# Patient Record
Sex: Female | Born: 1950 | Hispanic: Yes | Marital: Married | Smoking: Never smoker
Health system: Southern US, Community
[De-identification: ages and names within clinical notes are randomized; demographics above are authoritative.]

## PROBLEM LIST (undated history)

## (undated) DIAGNOSIS — G20 Parkinson's disease: Secondary | ICD-10-CM

## (undated) DIAGNOSIS — I10 Essential (primary) hypertension: Secondary | ICD-10-CM

## (undated) DIAGNOSIS — G4730 Sleep apnea, unspecified: Secondary | ICD-10-CM

## (undated) DIAGNOSIS — E785 Hyperlipidemia, unspecified: Secondary | ICD-10-CM

## (undated) DIAGNOSIS — Z9289 Personal history of other medical treatment: Secondary | ICD-10-CM

## (undated) DIAGNOSIS — R55 Syncope and collapse: Secondary | ICD-10-CM

## (undated) DIAGNOSIS — J111 Influenza due to unidentified influenza virus with other respiratory manifestations: Secondary | ICD-10-CM

## (undated) DIAGNOSIS — G20A1 Parkinson's disease without dyskinesia, without mention of fluctuations: Secondary | ICD-10-CM

## (undated) DIAGNOSIS — I5022 Chronic systolic (congestive) heart failure: Secondary | ICD-10-CM

## (undated) DIAGNOSIS — I428 Other cardiomyopathies: Secondary | ICD-10-CM

## (undated) DIAGNOSIS — K2970 Gastritis, unspecified, without bleeding: Secondary | ICD-10-CM

## (undated) DIAGNOSIS — M722 Plantar fascial fibromatosis: Secondary | ICD-10-CM

## (undated) HISTORY — DX: Sleep apnea, unspecified: G47.30

## (undated) HISTORY — DX: Essential (primary) hypertension: I10

## (undated) HISTORY — PX: TONSILLECTOMY: SUR1361

## (undated) HISTORY — PX: BREAST SURGERY: SHX581

## (undated) HISTORY — DX: Chronic systolic (congestive) heart failure: I50.22

## (undated) HISTORY — DX: Other cardiomyopathies: I42.8

## (undated) HISTORY — DX: Personal history of other medical treatment: Z92.89

## (undated) HISTORY — DX: Influenza due to unidentified influenza virus with other respiratory manifestations: J11.1

## (undated) HISTORY — PX: CHOLECYSTECTOMY: SHX55

## (undated) HISTORY — PX: TUBAL LIGATION: SHX77

## (undated) HISTORY — DX: Gastritis, unspecified, without bleeding: K29.70

## (undated) HISTORY — DX: Syncope and collapse: R55

## (undated) HISTORY — DX: Plantar fascial fibromatosis: M72.2

## (undated) HISTORY — DX: Hyperlipidemia, unspecified: E78.5

## (undated) HISTORY — PX: BUNIONECTOMY: SHX129

---

## 1968-08-01 HISTORY — PX: BREAST EXCISIONAL BIOPSY: SUR124

## 1998-02-16 ENCOUNTER — Ambulatory Visit (HOSPITAL_COMMUNITY): Admission: RE | Admit: 1998-02-16 | Discharge: 1998-02-16 | Payer: Self-pay | Admitting: Family Medicine

## 1999-08-24 ENCOUNTER — Other Ambulatory Visit: Admission: RE | Admit: 1999-08-24 | Discharge: 1999-08-24 | Payer: Self-pay | Admitting: Obstetrics and Gynecology

## 2000-06-29 ENCOUNTER — Inpatient Hospital Stay (HOSPITAL_COMMUNITY): Admission: EM | Admit: 2000-06-29 | Discharge: 2000-07-01 | Payer: Self-pay | Admitting: Emergency Medicine

## 2000-06-29 ENCOUNTER — Encounter: Payer: Self-pay | Admitting: Emergency Medicine

## 2000-06-30 ENCOUNTER — Encounter: Payer: Self-pay | Admitting: Internal Medicine

## 2000-07-27 ENCOUNTER — Encounter: Payer: Self-pay | Admitting: General Surgery

## 2000-08-03 ENCOUNTER — Ambulatory Visit (HOSPITAL_COMMUNITY): Admission: RE | Admit: 2000-08-03 | Discharge: 2000-08-04 | Payer: Self-pay | Admitting: General Surgery

## 2000-08-03 ENCOUNTER — Encounter: Payer: Self-pay | Admitting: General Surgery

## 2000-08-03 ENCOUNTER — Encounter (INDEPENDENT_AMBULATORY_CARE_PROVIDER_SITE_OTHER): Payer: Self-pay | Admitting: *Deleted

## 2000-12-18 ENCOUNTER — Encounter: Admission: RE | Admit: 2000-12-18 | Discharge: 2001-03-18 | Payer: Self-pay | Admitting: Internal Medicine

## 2000-12-29 ENCOUNTER — Other Ambulatory Visit: Admission: RE | Admit: 2000-12-29 | Discharge: 2000-12-29 | Payer: Self-pay | Admitting: Internal Medicine

## 2001-01-23 ENCOUNTER — Other Ambulatory Visit: Admission: RE | Admit: 2001-01-23 | Discharge: 2001-01-23 | Payer: Self-pay | Admitting: Obstetrics and Gynecology

## 2001-02-21 ENCOUNTER — Encounter (INDEPENDENT_AMBULATORY_CARE_PROVIDER_SITE_OTHER): Payer: Self-pay | Admitting: *Deleted

## 2001-02-21 ENCOUNTER — Ambulatory Visit (HOSPITAL_COMMUNITY): Admission: RE | Admit: 2001-02-21 | Discharge: 2001-02-21 | Payer: Self-pay | Admitting: Gastroenterology

## 2001-04-23 ENCOUNTER — Other Ambulatory Visit: Admission: RE | Admit: 2001-04-23 | Discharge: 2001-04-23 | Payer: Self-pay | Admitting: Obstetrics and Gynecology

## 2001-05-04 ENCOUNTER — Emergency Department (HOSPITAL_COMMUNITY): Admission: EM | Admit: 2001-05-04 | Discharge: 2001-05-04 | Payer: Self-pay | Admitting: Emergency Medicine

## 2001-05-04 ENCOUNTER — Encounter: Payer: Self-pay | Admitting: Emergency Medicine

## 2001-06-04 ENCOUNTER — Ambulatory Visit (HOSPITAL_BASED_OUTPATIENT_CLINIC_OR_DEPARTMENT_OTHER): Admission: RE | Admit: 2001-06-04 | Discharge: 2001-06-04 | Payer: Self-pay | Admitting: Pulmonary Disease

## 2001-06-25 ENCOUNTER — Emergency Department (HOSPITAL_COMMUNITY): Admission: EM | Admit: 2001-06-25 | Discharge: 2001-06-25 | Payer: Self-pay | Admitting: *Deleted

## 2001-06-25 ENCOUNTER — Encounter: Payer: Self-pay | Admitting: *Deleted

## 2001-09-28 ENCOUNTER — Ambulatory Visit (HOSPITAL_COMMUNITY): Admission: RE | Admit: 2001-09-28 | Discharge: 2001-09-28 | Payer: Self-pay

## 2001-10-15 ENCOUNTER — Ambulatory Visit (HOSPITAL_COMMUNITY): Admission: RE | Admit: 2001-10-15 | Discharge: 2001-10-15 | Payer: Self-pay | Admitting: Gastroenterology

## 2002-08-15 ENCOUNTER — Encounter: Admission: RE | Admit: 2002-08-15 | Discharge: 2002-08-15 | Payer: Self-pay | Admitting: Family Medicine

## 2002-08-22 ENCOUNTER — Encounter: Admission: RE | Admit: 2002-08-22 | Discharge: 2002-08-22 | Payer: Self-pay | Admitting: Family Medicine

## 2002-11-28 ENCOUNTER — Encounter: Admission: RE | Admit: 2002-11-28 | Discharge: 2002-11-28 | Payer: Self-pay | Admitting: Family Medicine

## 2003-07-11 ENCOUNTER — Encounter: Admission: RE | Admit: 2003-07-11 | Discharge: 2003-07-11 | Payer: Self-pay | Admitting: Family Medicine

## 2003-07-15 ENCOUNTER — Encounter: Admission: RE | Admit: 2003-07-15 | Discharge: 2003-07-15 | Payer: Self-pay | Admitting: Family Medicine

## 2003-08-29 ENCOUNTER — Encounter: Admission: RE | Admit: 2003-08-29 | Discharge: 2003-08-29 | Payer: Self-pay | Admitting: Sports Medicine

## 2003-09-03 ENCOUNTER — Encounter: Admission: RE | Admit: 2003-09-03 | Discharge: 2003-09-03 | Payer: Self-pay | Admitting: Family Medicine

## 2004-04-26 ENCOUNTER — Ambulatory Visit: Payer: Self-pay | Admitting: Sports Medicine

## 2004-06-17 ENCOUNTER — Ambulatory Visit: Payer: Self-pay | Admitting: Family Medicine

## 2004-09-20 ENCOUNTER — Ambulatory Visit: Payer: Self-pay | Admitting: Family Medicine

## 2004-10-04 ENCOUNTER — Ambulatory Visit: Payer: Self-pay | Admitting: Family Medicine

## 2004-10-05 ENCOUNTER — Ambulatory Visit: Payer: Self-pay | Admitting: Family Medicine

## 2004-11-23 ENCOUNTER — Encounter (INDEPENDENT_AMBULATORY_CARE_PROVIDER_SITE_OTHER): Payer: Self-pay | Admitting: *Deleted

## 2004-11-23 ENCOUNTER — Ambulatory Visit: Payer: Self-pay | Admitting: Obstetrics and Gynecology

## 2005-05-31 ENCOUNTER — Encounter (INDEPENDENT_AMBULATORY_CARE_PROVIDER_SITE_OTHER): Payer: Self-pay | Admitting: *Deleted

## 2005-05-31 ENCOUNTER — Ambulatory Visit: Payer: Self-pay | Admitting: Obstetrics and Gynecology

## 2005-05-31 ENCOUNTER — Emergency Department (HOSPITAL_COMMUNITY): Admission: EM | Admit: 2005-05-31 | Discharge: 2005-05-31 | Payer: Self-pay | Admitting: Family Medicine

## 2005-06-06 ENCOUNTER — Ambulatory Visit: Payer: Self-pay | Admitting: Family Medicine

## 2005-06-06 ENCOUNTER — Encounter (INDEPENDENT_AMBULATORY_CARE_PROVIDER_SITE_OTHER): Payer: Self-pay | Admitting: *Deleted

## 2005-06-06 LAB — CONVERTED CEMR LAB

## 2005-07-04 ENCOUNTER — Ambulatory Visit (HOSPITAL_COMMUNITY): Admission: RE | Admit: 2005-07-04 | Discharge: 2005-07-04 | Payer: Self-pay | Admitting: Family Medicine

## 2005-07-05 ENCOUNTER — Ambulatory Visit: Payer: Self-pay | Admitting: Sports Medicine

## 2005-10-28 ENCOUNTER — Ambulatory Visit: Payer: Self-pay | Admitting: Sports Medicine

## 2006-03-10 ENCOUNTER — Ambulatory Visit: Payer: Self-pay | Admitting: Family Medicine

## 2006-07-11 ENCOUNTER — Emergency Department (HOSPITAL_COMMUNITY): Admission: EM | Admit: 2006-07-11 | Discharge: 2006-07-11 | Payer: Self-pay | Admitting: Emergency Medicine

## 2006-07-14 ENCOUNTER — Ambulatory Visit (HOSPITAL_COMMUNITY): Admission: RE | Admit: 2006-07-14 | Discharge: 2006-07-14 | Payer: Self-pay | Admitting: Family Medicine

## 2006-07-14 ENCOUNTER — Ambulatory Visit: Payer: Self-pay | Admitting: Sports Medicine

## 2006-07-17 ENCOUNTER — Ambulatory Visit: Payer: Self-pay | Admitting: Family Medicine

## 2006-07-18 ENCOUNTER — Ambulatory Visit: Payer: Self-pay | Admitting: Sports Medicine

## 2006-08-04 ENCOUNTER — Ambulatory Visit (HOSPITAL_COMMUNITY): Admission: RE | Admit: 2006-08-04 | Discharge: 2006-08-04 | Payer: Self-pay | Admitting: Family Medicine

## 2006-08-28 ENCOUNTER — Ambulatory Visit: Payer: Self-pay | Admitting: Family Medicine

## 2006-09-28 DIAGNOSIS — E118 Type 2 diabetes mellitus with unspecified complications: Secondary | ICD-10-CM

## 2006-09-28 DIAGNOSIS — E1159 Type 2 diabetes mellitus with other circulatory complications: Secondary | ICD-10-CM

## 2006-09-28 DIAGNOSIS — I152 Hypertension secondary to endocrine disorders: Secondary | ICD-10-CM

## 2006-09-28 DIAGNOSIS — I10 Essential (primary) hypertension: Secondary | ICD-10-CM | POA: Insufficient documentation

## 2006-09-28 HISTORY — DX: Type 2 diabetes mellitus with unspecified complications: E11.8

## 2006-09-28 HISTORY — DX: Hypertension secondary to endocrine disorders: I15.2

## 2006-09-28 HISTORY — DX: Type 2 diabetes mellitus with other circulatory complications: E11.59

## 2006-09-29 ENCOUNTER — Encounter (INDEPENDENT_AMBULATORY_CARE_PROVIDER_SITE_OTHER): Payer: Self-pay | Admitting: *Deleted

## 2007-01-10 ENCOUNTER — Ambulatory Visit: Payer: Self-pay | Admitting: Obstetrics & Gynecology

## 2007-01-24 ENCOUNTER — Encounter: Payer: Self-pay | Admitting: Family Medicine

## 2007-01-24 ENCOUNTER — Ambulatory Visit: Payer: Self-pay | Admitting: Family Medicine

## 2007-01-24 LAB — CONVERTED CEMR LAB
ALT: 39 units/L — ABNORMAL HIGH (ref 0–35)
AST: 35 units/L (ref 0–37)
Albumin: 4.4 g/dL (ref 3.5–5.2)
Alkaline Phosphatase: 116 units/L (ref 39–117)
BUN: 17 mg/dL (ref 6–23)
CO2: 29 meq/L (ref 19–32)
Calcium: 10.7 mg/dL — ABNORMAL HIGH (ref 8.4–10.5)
Chloride: 101 meq/L (ref 96–112)
Creatinine, Ser: 0.95 mg/dL (ref 0.40–1.20)
Direct LDL: 111 mg/dL — ABNORMAL HIGH
Glucose, Bld: 80 mg/dL (ref 70–99)
Hgb A1c MFr Bld: 6.7 %
Potassium: 4.1 meq/L (ref 3.5–5.3)
Sodium: 143 meq/L (ref 135–145)
Total Bilirubin: 0.5 mg/dL (ref 0.3–1.2)
Total Protein: 7.6 g/dL (ref 6.0–8.3)

## 2007-02-08 ENCOUNTER — Telehealth: Payer: Self-pay | Admitting: *Deleted

## 2007-03-15 ENCOUNTER — Telehealth: Payer: Self-pay | Admitting: *Deleted

## 2007-05-17 ENCOUNTER — Ambulatory Visit: Payer: Self-pay | Admitting: Internal Medicine

## 2007-05-17 LAB — CONVERTED CEMR LAB
Blood Glucose, Fingerstick: 122
Hgb A1c MFr Bld: 7.1 %

## 2007-05-21 ENCOUNTER — Telehealth (INDEPENDENT_AMBULATORY_CARE_PROVIDER_SITE_OTHER): Payer: Self-pay | Admitting: *Deleted

## 2007-06-06 ENCOUNTER — Telehealth (INDEPENDENT_AMBULATORY_CARE_PROVIDER_SITE_OTHER): Payer: Self-pay | Admitting: Family Medicine

## 2007-06-18 ENCOUNTER — Encounter (INDEPENDENT_AMBULATORY_CARE_PROVIDER_SITE_OTHER): Payer: Self-pay | Admitting: Family Medicine

## 2007-09-07 ENCOUNTER — Ambulatory Visit: Payer: Self-pay | Admitting: Family Medicine

## 2007-10-03 ENCOUNTER — Ambulatory Visit (HOSPITAL_COMMUNITY): Admission: RE | Admit: 2007-10-03 | Discharge: 2007-10-03 | Payer: Self-pay | Admitting: Family Medicine

## 2007-10-03 ENCOUNTER — Ambulatory Visit: Payer: Self-pay | Admitting: Family Medicine

## 2007-10-03 DIAGNOSIS — R079 Chest pain, unspecified: Secondary | ICD-10-CM | POA: Insufficient documentation

## 2007-10-03 LAB — CONVERTED CEMR LAB: Hgb A1c MFr Bld: 7.4 %

## 2007-10-04 ENCOUNTER — Encounter (INDEPENDENT_AMBULATORY_CARE_PROVIDER_SITE_OTHER): Payer: Self-pay | Admitting: Family Medicine

## 2007-10-05 ENCOUNTER — Encounter (INDEPENDENT_AMBULATORY_CARE_PROVIDER_SITE_OTHER): Payer: Self-pay | Admitting: Family Medicine

## 2007-10-17 ENCOUNTER — Ambulatory Visit: Payer: Self-pay | Admitting: Family Medicine

## 2007-10-17 ENCOUNTER — Encounter (INDEPENDENT_AMBULATORY_CARE_PROVIDER_SITE_OTHER): Payer: Self-pay | Admitting: Family Medicine

## 2007-10-17 LAB — CONVERTED CEMR LAB
ALT: 61 units/L — ABNORMAL HIGH (ref 0–35)
AST: 51 units/L — ABNORMAL HIGH (ref 0–37)
Albumin: 4 g/dL (ref 3.5–5.2)
Alkaline Phosphatase: 114 units/L (ref 39–117)
BUN: 12 mg/dL (ref 6–23)
CO2: 27 meq/L (ref 19–32)
Calcium: 9.6 mg/dL (ref 8.4–10.5)
Chloride: 106 meq/L (ref 96–112)
Cholesterol: 169 mg/dL (ref 0–200)
Creatinine, Ser: 0.8 mg/dL (ref 0.40–1.20)
Glucose, Bld: 137 mg/dL — ABNORMAL HIGH (ref 70–99)
HDL: 51 mg/dL (ref >39)
LDL Cholesterol: 101 mg/dL — ABNORMAL HIGH (ref 0–99)
Potassium: 4.4 meq/L (ref 3.5–5.3)
Sodium: 144 meq/L (ref 135–145)
Total Bilirubin: 0.5 mg/dL (ref 0.3–1.2)
Total CHOL/HDL Ratio: 3.3
Total Protein: 7.1 g/dL (ref 6.0–8.3)
Triglycerides: 85 mg/dL (ref <150)
VLDL: 17 mg/dL (ref 0–40)

## 2007-10-29 ENCOUNTER — Telehealth: Payer: Self-pay | Admitting: *Deleted

## 2007-11-07 ENCOUNTER — Encounter (INDEPENDENT_AMBULATORY_CARE_PROVIDER_SITE_OTHER): Payer: Self-pay | Admitting: Family Medicine

## 2007-11-07 ENCOUNTER — Ambulatory Visit: Payer: Self-pay | Admitting: Sports Medicine

## 2007-11-07 LAB — CONVERTED CEMR LAB
BUN: 12 mg/dL (ref 6–23)
CO2: 29 meq/L (ref 19–32)
Calcium: 9.9 mg/dL (ref 8.4–10.5)
Chloride: 107 meq/L (ref 96–112)
Cholesterol: 146 mg/dL (ref 0–200)
Creatinine, Ser: 0.81 mg/dL (ref 0.40–1.20)
Glucose, Bld: 150 mg/dL — ABNORMAL HIGH (ref 70–99)
HCV Ab: NEGATIVE
HDL: 45 mg/dL (ref >39)
Hep A IgM: NEGATIVE
Hep B C IgM: NEGATIVE
Hep B S Ab: NEGATIVE
Hepatitis B Surface Ag: NEGATIVE
LDL Cholesterol: 81 mg/dL (ref 0–99)
Potassium: 4.4 meq/L (ref 3.5–5.3)
Sodium: 145 meq/L (ref 135–145)
Total CHOL/HDL Ratio: 3.2
Triglycerides: 101 mg/dL (ref <150)
VLDL: 20 mg/dL (ref 0–40)

## 2007-11-12 ENCOUNTER — Encounter (INDEPENDENT_AMBULATORY_CARE_PROVIDER_SITE_OTHER): Payer: Self-pay | Admitting: Family Medicine

## 2007-11-26 ENCOUNTER — Encounter (INDEPENDENT_AMBULATORY_CARE_PROVIDER_SITE_OTHER): Payer: Self-pay | Admitting: Family Medicine

## 2008-01-28 ENCOUNTER — Telehealth: Payer: Self-pay | Admitting: *Deleted

## 2008-02-18 ENCOUNTER — Encounter (INDEPENDENT_AMBULATORY_CARE_PROVIDER_SITE_OTHER): Payer: Self-pay | Admitting: Family Medicine

## 2008-05-23 ENCOUNTER — Encounter (INDEPENDENT_AMBULATORY_CARE_PROVIDER_SITE_OTHER): Payer: Self-pay | Admitting: Family Medicine

## 2008-05-26 ENCOUNTER — Ambulatory Visit: Payer: Self-pay | Admitting: Family Medicine

## 2008-06-17 ENCOUNTER — Encounter (INDEPENDENT_AMBULATORY_CARE_PROVIDER_SITE_OTHER): Payer: Self-pay | Admitting: Family Medicine

## 2008-06-17 ENCOUNTER — Ambulatory Visit: Payer: Self-pay | Admitting: Family Medicine

## 2008-06-17 LAB — CONVERTED CEMR LAB
ALT: 24 units/L (ref 0–35)
AST: 25 units/L (ref 0–37)
Albumin: 4.2 g/dL (ref 3.5–5.2)
Alkaline Phosphatase: 114 units/L (ref 39–117)
BUN: 19 mg/dL (ref 6–23)
Basophils Absolute: 0 10*3/uL (ref 0.0–0.1)
Basophils Relative: 1 % (ref 0–1)
CO2: 26 meq/L (ref 19–32)
Calcium: 9.9 mg/dL (ref 8.4–10.5)
Chloride: 102 meq/L (ref 96–112)
Creatinine, Ser: 0.7 mg/dL (ref 0.40–1.20)
Eosinophils Absolute: 0.3 10*3/uL (ref 0.0–0.7)
Eosinophils Relative: 6 % — ABNORMAL HIGH (ref 0–5)
Glucose, Bld: 116 mg/dL — ABNORMAL HIGH (ref 70–99)
HCT: 43.1 % (ref 36.0–46.0)
Hemoglobin: 14.1 g/dL (ref 12.0–15.0)
Hgb A1c MFr Bld: 6.6 %
Lymphocytes Relative: 29 % (ref 12–46)
Lymphs Abs: 1.5 10*3/uL (ref 0.7–4.0)
MCHC: 32.7 g/dL (ref 30.0–36.0)
MCV: 90 fL (ref 78.0–100.0)
Monocytes Absolute: 0.6 10*3/uL (ref 0.1–1.0)
Monocytes Relative: 11 % (ref 3–12)
Neutro Abs: 2.9 10*3/uL (ref 1.7–7.7)
Neutrophils Relative %: 54 % (ref 43–77)
Platelets: 211 10*3/uL (ref 150–400)
Potassium: 4.2 meq/L (ref 3.5–5.3)
RBC: 4.79 M/uL (ref 3.87–5.11)
RDW: 12.8 % (ref 11.5–15.5)
Sodium: 140 meq/L (ref 135–145)
TSH: 1.376 microintl units/mL (ref 0.350–4.50)
Total Bilirubin: 0.4 mg/dL (ref 0.3–1.2)
Total Protein: 7.2 g/dL (ref 6.0–8.3)
WBC: 5.4 10*3/uL (ref 4.0–10.5)

## 2008-06-18 ENCOUNTER — Encounter (INDEPENDENT_AMBULATORY_CARE_PROVIDER_SITE_OTHER): Payer: Self-pay | Admitting: Family Medicine

## 2008-06-20 ENCOUNTER — Encounter (INDEPENDENT_AMBULATORY_CARE_PROVIDER_SITE_OTHER): Payer: Self-pay | Admitting: Family Medicine

## 2008-06-20 LAB — CONVERTED CEMR LAB
OCCULT 1: NEGATIVE
OCCULT 2: NEGATIVE
OCCULT 3: NEGATIVE

## 2008-09-25 ENCOUNTER — Encounter (INDEPENDENT_AMBULATORY_CARE_PROVIDER_SITE_OTHER): Payer: Self-pay | Admitting: Family Medicine

## 2008-09-25 ENCOUNTER — Telehealth: Payer: Self-pay | Admitting: *Deleted

## 2008-10-08 ENCOUNTER — Telehealth (INDEPENDENT_AMBULATORY_CARE_PROVIDER_SITE_OTHER): Payer: Self-pay | Admitting: Family Medicine

## 2008-12-19 ENCOUNTER — Telehealth: Payer: Self-pay | Admitting: *Deleted

## 2009-01-19 ENCOUNTER — Ambulatory Visit: Payer: Self-pay | Admitting: Family Medicine

## 2009-01-19 LAB — CONVERTED CEMR LAB: Hgb A1c MFr Bld: 6.8 %

## 2009-03-27 ENCOUNTER — Ambulatory Visit: Payer: Self-pay | Admitting: Family Medicine

## 2009-03-27 DIAGNOSIS — M7660 Achilles tendinitis, unspecified leg: Secondary | ICD-10-CM | POA: Insufficient documentation

## 2009-04-15 ENCOUNTER — Ambulatory Visit: Payer: Self-pay | Admitting: Family Medicine

## 2009-05-14 ENCOUNTER — Telehealth (INDEPENDENT_AMBULATORY_CARE_PROVIDER_SITE_OTHER): Payer: Self-pay | Admitting: *Deleted

## 2009-05-14 ENCOUNTER — Ambulatory Visit: Payer: Self-pay | Admitting: Sports Medicine

## 2009-05-29 ENCOUNTER — Ambulatory Visit: Payer: Self-pay | Admitting: Family Medicine

## 2009-05-29 DIAGNOSIS — L719 Rosacea, unspecified: Secondary | ICD-10-CM

## 2009-05-29 HISTORY — DX: Rosacea, unspecified: L71.9

## 2009-07-22 ENCOUNTER — Ambulatory Visit: Payer: Self-pay | Admitting: Sports Medicine

## 2009-07-22 DIAGNOSIS — M722 Plantar fascial fibromatosis: Secondary | ICD-10-CM | POA: Insufficient documentation

## 2009-07-22 DIAGNOSIS — M5430 Sciatica, unspecified side: Secondary | ICD-10-CM | POA: Insufficient documentation

## 2009-08-07 ENCOUNTER — Telehealth: Payer: Self-pay | Admitting: Family Medicine

## 2009-08-17 ENCOUNTER — Ambulatory Visit: Payer: Self-pay | Admitting: Family Medicine

## 2009-09-22 ENCOUNTER — Telehealth: Payer: Self-pay | Admitting: Family Medicine

## 2009-09-23 ENCOUNTER — Ambulatory Visit: Payer: Self-pay | Admitting: Family Medicine

## 2009-09-23 ENCOUNTER — Encounter: Payer: Self-pay | Admitting: Family Medicine

## 2009-09-24 ENCOUNTER — Telehealth: Payer: Self-pay | Admitting: Family Medicine

## 2009-09-24 LAB — CONVERTED CEMR LAB
Mumps IgG: 3.21 — ABNORMAL HIGH
Rubella: >500 intl units/mL — ABNORMAL HIGH
Rubeola IgG: 4.62 — ABNORMAL HIGH

## 2009-09-27 ENCOUNTER — Emergency Department (HOSPITAL_COMMUNITY): Admission: EM | Admit: 2009-09-27 | Discharge: 2009-09-27 | Payer: Self-pay | Admitting: Family Medicine

## 2009-09-29 ENCOUNTER — Telehealth: Payer: Self-pay | Admitting: Family Medicine

## 2009-09-29 ENCOUNTER — Ambulatory Visit: Payer: Self-pay | Admitting: Family Medicine

## 2009-09-29 ENCOUNTER — Encounter: Payer: Self-pay | Admitting: Family Medicine

## 2009-10-29 ENCOUNTER — Telehealth: Payer: Self-pay | Admitting: Family Medicine

## 2010-01-21 ENCOUNTER — Encounter: Payer: Self-pay | Admitting: Family Medicine

## 2010-05-07 ENCOUNTER — Encounter: Payer: Self-pay | Admitting: Family Medicine

## 2010-05-12 ENCOUNTER — Telehealth: Payer: Self-pay | Admitting: Family Medicine

## 2010-06-07 ENCOUNTER — Encounter: Payer: Self-pay | Admitting: Family Medicine

## 2010-06-09 ENCOUNTER — Telehealth: Payer: Self-pay | Admitting: Family Medicine

## 2010-06-14 ENCOUNTER — Ambulatory Visit: Payer: Self-pay | Admitting: Family Medicine

## 2010-06-14 LAB — CONVERTED CEMR LAB: Hgb A1c MFr Bld: 6.3 %

## 2010-06-15 ENCOUNTER — Telehealth: Payer: Self-pay | Admitting: Family Medicine

## 2010-06-29 ENCOUNTER — Telehealth: Payer: Self-pay | Admitting: *Deleted

## 2010-06-29 ENCOUNTER — Ambulatory Visit: Payer: Self-pay | Admitting: Family Medicine

## 2010-06-29 ENCOUNTER — Encounter: Payer: Self-pay | Admitting: Family Medicine

## 2010-06-29 LAB — CONVERTED CEMR LAB
ALT: 17 units/L (ref 0–35)
AST: 23 units/L (ref 0–37)
Albumin/Creatinine Ratio, Urine, POC: <30
Albumin: 4.6 g/dL (ref 3.5–5.2)
Alkaline Phosphatase: 109 units/L (ref 39–117)
BUN: 15 mg/dL (ref 6–23)
CO2: 28 meq/L (ref 19–32)
Calcium: 9.9 mg/dL (ref 8.4–10.5)
Chloride: 106 meq/L (ref 96–112)
Cholesterol: 186 mg/dL (ref 0–200)
Creatinine, Ser: 0.72 mg/dL (ref 0.40–1.20)
Creatinine,U: 200 mg/dL
Glucose, Bld: 124 mg/dL — ABNORMAL HIGH (ref 70–99)
HDL: 56 mg/dL (ref >39)
LDL Cholesterol: 109 mg/dL — ABNORMAL HIGH (ref 0–99)
Microalbumin U total vol: 30 mg/L
Potassium: 3.9 meq/L (ref 3.5–5.3)
Sodium: 144 meq/L (ref 135–145)
Total Bilirubin: 0.4 mg/dL (ref 0.3–1.2)
Total CHOL/HDL Ratio: 3.3
Total Protein: 7.3 g/dL (ref 6.0–8.3)
Triglycerides: 103 mg/dL (ref <150)
VLDL: 21 mg/dL (ref 0–40)

## 2010-07-05 ENCOUNTER — Telehealth: Payer: Self-pay | Admitting: *Deleted

## 2010-08-04 ENCOUNTER — Encounter: Payer: Self-pay | Admitting: Family Medicine

## 2010-08-31 NOTE — Letter (Signed)
Summary: Generic Letter  [facility]  [address]   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 If you have any questions, please call our office.  Sincerely,   Cyndia Bent MD [facility]

## 2010-08-31 NOTE — Miscellaneous (Signed)
Summary: Chart Summary  Very pleasant lady.  Follows up very infrequently secondary to finances.  Is past due for check up for chonic issues.   Complete Medication List: 1)  Bayer Childrens Aspirin 81 Mg Chew (Aspirin) .... Take 1 tablet by mouth once a day 2)  Metformin Hcl 500 Mg Tabs (Metformin hcl) .... Take 1 tablet by mouth twice a day 3)  Albuterol Sulfate (2.5 Mg/7ml) 0.083% Nebu (Albuterol sulfate) .... Use one ampule in nebulizer 4 times a day as needed 4)  Nitroquick 0.4 Mg Subl (Nitroglycerin) .... Take one tablet and place under tongue if develop chest pain 5)  Cozaar 100 Mg Tabs (Losartan potassium) .... Take one tablet daily 6)  Loratadine 10 Mg Tabs (Loratadine) .[name] Kitchen.. 1 tab by mouth daily for allergies 7)  Nitroglycerin 0.2 Mg/hr Pt24 (Nitroglycerin) .... Use 1/4 patch daily as directed 8)  Voltaren 1 % Gel (Diclofenac sodium) .[name] Kitchen.. 1 gram into affected areas qid 9)  Meloxicam 7.5 Mg Tabs (Meloxicam) .[name] Kitchen.. 1 tab by mouth daily for heel pain 10)  Flovent Diskus 100 Mcg/blist Aepb (Fluticasone propionate (inhal)) .... Inhale two times a day

## 2010-08-31 NOTE — Progress Notes (Signed)
----   Converted from flag ---- ---- 06/05/2007 3:25 PM, Levada Schilling wrote: I did contact pt.  She is to come in to sign application.  ---- 06/01/2007 2:54 PM, Danijela Vessey HOBBS MD wrote: Would there be a way for this patient to get Losartan 50 mg daily through the MAP. She has an allergy to ACE inhibitors and cannot afford to pay for Losartan as it is about 66 dollars a month. Please give this patient a call if you have a chance. Thanks so much. ------------------------------

## 2010-08-31 NOTE — Miscellaneous (Signed)
Summary: Clean up old acute medications  Clinical Lists Changes  Medications: Removed medication of TRAMADOL HCL 50 MG TABS (TRAMADOL HCL) 1 tab by mouth qHS as needed severe pain

## 2010-08-31 NOTE — Progress Notes (Signed)
Summary: Flexeril  Phone Note Call from Patient Call back at Home Phone [phone]   Reason for Call: Talk to Nurse Summary of Call: Patient is upset that we won't fill flexeril unless she is seen - Pt states she was just here and states she only fills the rx and takes it when she needs it - pt has requested to speak with someone regarding this. Initial call taken by: Haydee Salter,  March 15, 2007 2:49 PM  Follow-up for Phone Call        Please advise patient that I was not aware she was taking this medication on an as needed basis since her previous prescription was written differently. I did give her a refill. Please let her know I have sent it to her pharmacy. SHe needs to schedule an appointment for the end of September beginning of October since that is when her previous doctor Morono Coll advised her to come back. Thanks.  Follow-up by: Alanda Amass MD,  March 15, 2007 3:04 PM  Additional Follow-up for Phone Call Additional follow up Details #1::        told her above & urged her to make appt asap to ensure appt with pcp in desired timeframe Additional Follow-up by: Golden Circle RN,  March 15, 2007 3:21 PM    New/Updated Medications: FLEXERIL 5 MG TABS (CYCLOBENZAPRINE HCL) 1 tablet by mouth as needed   Prescriptions: FLEXERIL 5 MG TABS (CYCLOBENZAPRINE HCL) 1 tablet by mouth as needed  #20 x 0   Entered and Authorized by:   Alanda Amass MD   Signed by:   Alanda Amass MD on 03/15/2007   Method used:   Electronically sent to ...       [facility]       [address]       [place], [place]  [address]       Ph: [phone]       Fax: [phone]   RxID:   [id number]

## 2010-08-31 NOTE — Assessment & Plan Note (Signed)
Summary: f/u last visit/eo   Vital Signs:  Patient profile:   60 year old female Height:      62.5 inches Weight:      141 pounds BMI:     25.47 BSA:     1.66 Temp:     98.6 degrees F Pulse rate:   101 / minute BP sitting:   160 / 80  Vitals Entered By: [name] CMA (April 15, 2009 4:32 PM) CC: f/u last visit Is Patient Diabetic? Yes  Pain Assessment Patient in pain? yes     Location: right leg and hip Intensity: 5   Primary Care Provider:  Romero Belling MD  CC:  f/u last visit.  History of Present Illness: 60 yo female presenting with for followup of RIGHT heel pain.  First evaluated for this 08/27 at which time she reported a 4 month history of right heel pain, but today she states it has actually been going on for years and that she attributes it to abnormal gait from back pain.  Pain is not better or worse today.  She was to have done alternating hot/cold baths on the heel and use Meloxicam, but Meloxicam caused epistaxis.  Habits & Providers  Alcohol-Tobacco-Diet     Tobacco Status: never  Allergies: 1)  ! Ace Inhibitors 2)  ! Meloxicam (Meloxicam) 3)  Penicillin  Physical Exam  General:  Well-developed,well-nourished,in no acute distress; alert,appropriate and cooperative throughout examination Extremities:  Tender nodule on posterior aspect of RIGHT heel.  Antalgic gait.   Impression & Recommendations:  Problem # 1:  ACHILLES BURSITIS OR TENDINITIS (ICD-726.71) Assessment Unchanged  Haglund's Deformity.  Precepted with Dr. Pearletha Forge.  See patient instructions for plan.  Orders: [facility]- Est Level  3 (13086)  Complete Medication List: 1)  Bayer Childrens Aspirin 81 Mg Chew (Aspirin) .... Take 1 tablet by mouth once a day 2)  Metformin Hcl 500 Mg Tabs (Metformin hcl) .... Take 1 tablet by mouth twice a day 3)  Albuterol Sulfate (2.5 Mg/58ml) 0.083% Nebu (Albuterol sulfate) .... Use one ampule in nebulizer 4 times a day as needed 4)  Nitroquick  0.4 Mg Subl (Nitroglycerin) .... Take one tablet and place under tongue if develop chest pain 5)  Cozaar 100 Mg Tabs (Losartan potassium) .... Take one tablet daily 6)  Voltaren 1 % Gel (Diclofenac sodium) .... Apply to heel 4 times daily x4 weeks.  dispense 3 x 100 gram tube. 7)  Loratadine 10 Mg Tabs (Loratadine) .[name] Kitchen.. 1 tab by mouth daily for allergies  Patient Instructions: 1)  Do the exercises we showed you today:  3 sets of 15 repetitions in the morning and 3 sets of 15 repetitions in the evening. 2)  Use a heel cup under the painful heel. 3)  Use the gel 4 times daily. 4)  Please follow up as needed with Dr. Pearletha Forge at [facility]:  [phone]. Prescriptions: LORATADINE 10 MG TABS (LORATADINE) 1 tab by mouth daily for allergies  #30 x 3   Entered and Authorized by:   Romero Belling MD   Signed by:   Romero Belling MD on 04/15/2009   Method used:   Print then Give to Patient   RxID:   [id number] VOLTAREN 1 % GEL (DICLOFENAC SODIUM) Apply to heel 4 times daily x4 weeks.  Dispense 3 x 100 gram tube.  #1 x 2   Entered and Authorized by:   Romero Belling MD   Signed by:   Romero Belling MD on 04/15/2009  Method used:   Print then Give to Patient   RxID:   [id number]

## 2010-08-31 NOTE — Miscellaneous (Signed)
Summary: [facility] MEDS RECV'D  Clinical Lists Changes   RECV'D FROM [facility]:  COZAAR - 1 BTL(#90), 50MG , LOT #[id number], EXP. 11/18/08 PT NOTIFIED

## 2010-08-31 NOTE — Assessment & Plan Note (Signed)
Summary: fu last appt/el   Vital Signs:  Patient Profile:   60 Years Old Female Height:     62.5 inches (158.75 cm) Weight:      148 pounds (67.27 kg) BMI:     26.73 Temp:     98.8 degrees F (37.11 degrees C) Pulse rate:   106 / minute BP sitting:   133 / 73  Vitals Entered By: [name] CMA (October 03, 2007 3:02 PM)                 PCP:  Alanda Amass MD  Chief Complaint:  f/u rash.  History of Present Illness: Pt is a 60 yo female with DM here because she is having CP/SOB and to follow up for rash.   SOB and CP x 2 weeks - Thinks this is due to one of her medications. She stopped the medication and now she has no CP or SOB. Thinks it was her Dyazide. CP is stabbing and was coming on at rest and lasting for 30 min but wasn't occuring when she was exercising. CP was in the center of her chest. Relaxing helped it go away. Denies N/V/abd pain with the chest pain. Last had CP 2-3 days ago. Has also felt her heart racing for the past few days. Per patient has sleep apnea but doesn't use machine because it was annoying.  DM - Taking metformin 500 BID daily. Somtimes her sugars feel low but the lowest that get is around 140 if I remember correctly. No feet lesions. AIC 7.1 in October.  Itching/rash - much improved. Now only every now and then it itches. Was treated with permethrin for possible scabes as well as cream. Would like a refill on hydrocortisone 2.5% cream since buttocks and thighs still itch a little. Uses it every other day at night.   She likes to stop taking her medicine on her own and comes up with various side effects they all give her. She prefers taking Cozaar instead of Enalapril because says Enalapril gives her muscle aches.      Prior Medications Reviewed Using: Patient Recall  Prior Medication List:  BAYER CHILDRENS ASPIRIN 81 MG CHEW (ASPIRIN) Take 1 tablet by mouth once a day DUONEB 2.5-0.5 MG/3ML SOLN (ALBUTEROL-IPRATROPIUM) Inhale 3 ml as directed  every four hours DYAZIDE 37.5-25 MG CAPS (TRIAMTERENE-HCTZ) Take 1 capsule by mouth once a day FLEXERIL 5 MG TABS (CYCLOBENZAPRINE HCL) 1 tablet by mouth as needed METFORMIN HCL 500 MG TABS (METFORMIN HCL) Take 1 tablet by mouth twice a day TRIAMCINOLONE ACETONIDE 0.025 %  CREA (TRIAMCINOLONE ACETONIDE) Apply to itchy areas 2-3 times a day. Disp 1 months supply. Please make an apointment with our office. COZAAR 50 MG  TABS (LOSARTAN POTASSIUM) Take one tablet daily ALBUTEROL SULFATE (2.5 MG/3ML) 0.083%  NEBU (ALBUTEROL SULFATE) Use one ampule in nebulizer 4 times a day as needed PERMETHRIN 5 %  CREA (PERMETHRIN) apply to entire body at night from the neck down before bed for scabies. Wash off in the morning completely. Dispense quantity sufficient. HYDROCORTISONE 2.5 %  OINT (HYDROCORTISONE) apply to affeted areas 2-3 time per day until itching, redness subsides. Dispense medium-sized container   Current Allergies: ! ACE INHIBITORS PENICILLIN      Physical Exam  General:     Well-developed,well-nourished,in no acute distress; alert,appropriate and cooperative throughout examination Head:     normocephalic and atraumatic.   Lungs:     normal respiratory effort, normal breath sounds, no crackles,  and no wheezes.   Heart:     normal rate, regular rhythm, no murmur, no gallop, and no rub.   Abdomen:     soft and non-tender.   Extremities:     No edema Skin:     hyperpigmented lesions on bilateral thighs. She doesn't want me to examine her buttocks today. Flat darker colored skin. No erythema.      Impression & Recommendations:  Problem # 1:  CHEST PAIN (ICD-786.50) Assessment: New EKG looks normal. Very much doubt that her diazide is causing her chest pain. It could be anxiety though. We discussed doing a stress test since she is a diabetic. She has no insurance and says she can't do a treadmill test because she can't walk far. Does not want to go to a cardiologist because of  expense. Since not having CP for past few days, have her a prescription for NTG and advised her to try this if she has CP again. If this relieves the pain go to ED. May also try milk and tums. Given info for [facility]. Orders: 12 Lead EKG (12 Lead EKG) [facility]- Est  Level 4 (24401)   Problem # 2:  HYPERTENSION, BENIGN SYSTEMIC (ICD-401.1) Assessment: Unchanged Will stop Dyazide since she is convined this is causing her chest pain. Evidently was not taking Cozaar. She was taking Enalapril prior to changing to Cozaar. Just got MAP Cozaar in and says that she would prefer this. I called patient after her appointment on 10/06/07 and discussed this.   The following medications were removed from the medication list:    Dyazide 37.5-25 Mg Caps (Triamterene-hctz) .[name] Kitchen... Take 1 capsule by mouth once a day    Cozaar 50 Mg Tabs (Losartan potassium) .[name] Kitchen... Take one tablet daily    Vasotec 10 Mg Tabs (Enalapril maleate) .[name] Kitchen... Take one tablet daily  Her updated medication list for this problem includes:    Cozaar 50 Mg Tabs (Losartan potassium) .[name] Kitchen... Take one tablet daily. rx through map  Orders: 12 Lead EKG (12 Lead EKG) [facility]- Est  Level 4 ([address]25)  Future Orders: Comp Met-[facility] ([id number]) ... 10/29/2008 Lipid-[facility] ([id number]) ... 10/29/2008   Problem # 3:  PRURITUS (ICD-698.9) Assessment: Unchanged Much improved. Given refill for hydrocortisone cream since seems to help a lot.  Orders: [facility]- Est  Level 4 (43329)   Problem # 4:  DIABETES MELLITUS II, UNCOMPLICATED (ICD-250.00) Assessment: Unchanged Hgb AIC 7.4 today. Continue Metformin two times a day.  The following medications were removed from the medication list:    Cozaar 50 Mg Tabs (Losartan potassium) .[name] Kitchen... Take one tablet daily    Vasotec 10 Mg Tabs (Enalapril maleate) .[name] Kitchen... Take one tablet daily  Her updated medication list for this problem includes:    Bayer Childrens Aspirin 81 Mg Chew (Aspirin) .[name] Kitchen... Take 1 tablet by mouth once a day     Metformin Hcl 500 Mg Tabs (Metformin hcl) .[name] Kitchen... Take 1 tablet by mouth twice a day    Cozaar 50 Mg Tabs (Losartan potassium) .[name] Kitchen... Take one tablet daily. rx through map  Orders: A1C-[facility] ([id number]) 12 Lead EKG (12 Lead EKG) [facility]- Est  Level 4 (16606)  Future Orders: Comp Met-[facility] ([id number]) ... 10/29/2008 Lipid-[facility] ([id number]) ... 10/29/2008   Complete Medication List: 1)  Bayer Childrens Aspirin 81 Mg Chew (Aspirin) .... Take 1 tablet by mouth once a day 2)  Flexeril 5 Mg Tabs (Cyclobenzaprine hcl) .[name] Kitchen.. 1 tablet by mouth as needed 3)  Metformin Hcl 500 Mg Tabs (Metformin  hcl) .... Take 1 tablet by mouth twice a day 4)  Albuterol Sulfate (2.5 Mg/39ml) 0.083% Nebu (Albuterol sulfate) .... Use one ampule in nebulizer 4 times a day as needed 5)  Hydrocortisone 2.5 % Oint (Hydrocortisone) .... Apply to affeted areas 2-3 time per day until itching, redness subsides. dispense medium-sized container 6)  Nitroquick 0.4 Mg Subl (Nitroglycerin) .... Take one tablet and place under tongue if develop chest pain 7)  Cozaar 50 Mg Tabs (Losartan potassium) .... Take one tablet daily. rx through map   Patient Instructions: 1)  I would like you to come back in 2 weeks and have your blood work checked.  2)  I would also like for your to increase your Enalapril to 10 mg daily. You can take 2 of the 5 mg tablets until you run out. I will send a prescription to the [facility] on [address] for 10 mg. (I called patient and she said she just got Cozaar via [facility] and would rather take this. Stopped enalapril) 3)  We will check an EKG today. - it looks normal 4)  If you begin having chest pain take a nitroglycerin tablet - dissolve under your tongue. If this relieves the pain immediately go to the emergency room.  5)  You can also see if it goes away with milk or tums. If you begin having this when you exercise then call us. Also call us if your chest pain comes back.     Prescriptions: NITROQUICK 0.4 MG  SUBL  (NITROGLYCERIN) Take one tablet and place under tongue if develop chest pain  #3 x 0   Entered and Authorized by:   Alanda Amass MD   Signed by:   Alanda Amass MD on 10/03/2007   Method used:   Electronically sent to ...       [facility]       [address]       [place], [place]  [address]       Ph: [phone]       Fax: [phone]   RxID:   [id number] HYDROCORTISONE 2.5 %  OINT (HYDROCORTISONE) apply to affeted areas 2-3 time per day until itching, redness subsides. Dispense medium-sized container  #1 x 1   Entered and Authorized by:   Alanda Amass MD   Signed by:   Alanda Amass MD on 10/03/2007   Method used:   Electronically sent to ...       [facility]       [address]       [place], [place]  [address]       Ph: [phone]       Fax: [phone]   RxID:   [id number] VASOTEC 10 MG  TABS (ENALAPRIL MALEATE) Take one tablet daily  #30 x 0   Entered and Authorized by:   Alanda Amass MD   Signed by:   Alanda Amass MD on 10/03/2007   Method used:   Electronically sent to ...       [facility]       [address]       [place], [place]  [address]       Ph: [phone]       Fax: [phone]   RxID:   [id number]  ] Laboratory Results   Blood Tests  Date/Time Received: October 03, 2007 3:04 PM  Date/Time Reported: October 03, 2007 3:18 PM   HGBA1C: 7.4%   (Normal Range: Non-Diabetic - 3-6%   Control Diabetic - 6-8%)  Comments: ...........test performed by.......Eustaquio Boyden, MD

## 2010-08-31 NOTE — Assessment & Plan Note (Signed)
Summary: dm fu wp   Vital Signs:  Patient Profile:   60 Years Old Female Height:     62.5 inches (158.75 cm) Weight:      143 pounds (65 kg) BMI:     25.83 Temp:     97.9 degrees F (36.61 degrees C) Pulse rate:   78 / minute BP sitting:   152 / 79  Pt. in pain?   no  Vitals Entered By: [name] CMA (June 17, 2008 11:23 AM)                   PCP:  Alanda Amass MD  Chief Complaint:  f/u DM, feels sluggish, weight loss, and wants kidneys and liver checked.  History of Present Illness: 60 yo female with HTN, DM here today complaining of feeling sluggish and losing weight.  1. Feeling sluggish - Has felt sluggish for the last few months. Seems to coordinate with having a stressful job. She just quit that job yesterday and now really likes her new job at [employer].   2. Weight loss - Feels that she is losing weight without trying. Has increased appetite but decreased weight. Had thyroid problems in the past and was taking medication but has not been on meds in a long time. According to centricity she has lost only 4 lbs in the last 12 months. Not due for mammogram or colonoscopy. Does not want to even think about colonoscopy as her husband recently had intestinal perforation after this. She is worried about the weight loss because she recently lost 2 aunts due to ovarian cancer. Denies any bloating or abd pain currently. Due for pap smear but wants to come back to have this done.   3. Wants kidneys and liver checked because tried to enroll in a study program and they would not let her because these numbers were off.   4. DM - Having AIC checked today which is 6.6.   5. HTN - Denies CP or SOB that is different from what she gets due to asthma.      Current Allergies: ! ACE INHIBITORS PENICILLIN   Family History:    Diabetes  HTN    2 Aunts with ovarian cancer    Mom - Died of MI in her 90s   Risk Factors:  Mammogram History:     Date of Last  Mammogram:  08/01/2006   PAP Smear History:     Date of Last PAP Smear:  06/06/2005   Colonoscopy History:     Date of Last Colonoscopy:  08/02/1999     Physical Exam  General:     Well-developed,well-nourished,in no acute distress; alert,appropriate and cooperative throughout examination Neck:     supple, no masses, no thyromegaly, and no cervical lymphadenopathy.   Lungs:     normal respiratory effort, normal breath sounds, no crackles, and no wheezes.   Heart:     normal rate, regular rhythm, no murmur, no gallop, and no rub.   Abdomen:     soft, non-tender, no distention, no masses, no hepatomegaly, and no splenomegaly.   Extremities:     No edema Neurologic:     alert & oriented X3.   Cervical Nodes:     no anterior cervical adenopathy and no posterior cervical adenopathy.   Axillary Nodes:     no R axillary adenopathy and no L axillary adenopathy.   Inguinal Nodes:     no R inguinal adenopathy and no L inguinal adenopathy.  Impression & Recommendations:  Problem # 1:  FATIGUE (ICD-780.79) Assessment: New Fatigue seems to correspond to stressful job. However, given small amt of weight loss will go ahead and rule out other causes with below labs. Hopefully with different job her stress level and energy level will improve.   Orders: Comp Met-[facility] [id number]) CBC w/Diff-[facility] (09811) Sed Rate (ESR)-[facility] [phone]) TSH-[facility] [id number]) [facility]- Est  Level 4 (57846)   Problem # 2:  LIVER FUNCTION TESTS, ABNORMAL (ICD-794.8) Assessment: Unchanged Recheck today given hx of study tests being abnormal.  Orders: [facility]- Est  Level 4 (96295)   Problem # 3:  HYPERTENSION, BENIGN SYSTEMIC (ICD-401.1) Assessment: Deteriorated Elevated today. Will increase to 100 mg of Cozaar. Patient would like to try 1.5 tablets first daily and then increase if tolerates.   Her updated medication list for this problem includes:    Cozaar 50 Mg Tabs (Losartan potassium) .[name] Kitchen... Take 2  tablets daily  Orders: Comp Met-[facility] ([id number]) [facility]- Est  Level 4 (02725)   Problem # 4:  DIABETES MELLITUS II, UNCOMPLICATED (ICD-250.00) Assessment: Improved Well controlled. Continue current meds.  Her updated medication list for this problem includes:    Bayer Childrens Aspirin 81 Mg Chew (Aspirin) .[name] Kitchen... Take 1 tablet by mouth once a day    Metformin Hcl 500 Mg Tabs (Metformin hcl) .[name] Kitchen... Take 1 tablet by mouth twice a day    Cozaar 50 Mg Tabs (Losartan potassium) .[name] Kitchen... Take 2 tablets daily  Orders: A1C-[facility] (36644) [facility]- Est  Level 4 (03474)   Complete Medication List: 1)  Bayer Childrens Aspirin 81 Mg Chew (Aspirin) .... Take 1 tablet by mouth once a day 2)  Flexeril 5 Mg Tabs (Cyclobenzaprine hcl) .[name] Kitchen.. 1 tablet by mouth as needed 3)  Metformin Hcl 500 Mg Tabs (Metformin hcl) .... Take 1 tablet by mouth twice a day 4)  Albuterol Sulfate (2.5 Mg/54ml) 0.083% Nebu (Albuterol sulfate) .... Use one ampule in nebulizer 4 times a day as needed 5)  Hydrocortisone 2.5 % Oint (Hydrocortisone) .... Apply to affeted areas 2-3 time per day until itching, redness subsides. dispense medium-sized container 6)  Nitroquick 0.4 Mg Subl (Nitroglycerin) .... Take one tablet and place under tongue if develop chest pain 7)  Cozaar 50 Mg Tabs (Losartan potassium) .... Take 2 tablets daily 8)  Fluocinonide 0.05 % Crea (Fluocinonide) .... Apply a small amount to affected area/rash two times a day as needed but only for a few days at a time. 9)  Neurontin 300 Mg Caps (Gabapentin) .... Take one tablet q hs as needed tingling/pain at night   Patient Instructions: 1)  We will check your blood counts, liver, kidney, thyroid today. 2)  Make an appointment to see me in December so we can follow up and you can have the pap smear. 3)  If your lab work is abnormal we will call you. Otherwise you can expect a letter. 4)  Part of this is likely due to stress at work. Try to find times to do things you enjoy and  relax.  5)  I would also like for you to complete some stool cards.   Prescriptions: NEURONTIN 300 MG CAPS (GABAPENTIN) Take one tablet q hs as needed tingling/pain at night  #30 x 0   Entered and Authorized by:   Cyndia Bent MD   Signed by:   Cyndia Bent MD on 06/17/2008   Method used:   Electronically to        [facility] (retail)  [address]       [place], [place]  [address]       Ph: [phone]       Fax: [phone]   RxID:   [id number] COZAAR 50 MG  TABS (LOSARTAN POTASSIUM) Take one tablet BID.  #90 x 1   Entered and Authorized by:   Cyndia Bent MD   Signed by:   Cyndia Bent MD on 06/17/2008   Method used:   Electronically to        [facility] (retail)       [address]       [place], [place]  [address]       Ph: [phone]       Fax: [phone]   RxID:   [id number] METFORMIN HCL 500 MG TABS (METFORMIN HCL) Take 1 tablet by mouth twice a day  #90 x 1   Entered and Authorized by:   Cyndia Bent MD   Signed by:   Cyndia Bent MD on 06/17/2008   Method used:   Electronically to        [facility] (retail)       [address]       [place], [place]  [address]       Ph: [phone]       Fax: [phone]   RxID:   [id number]  ] Laboratory Results   Blood Tests   Date/Time Received: June 17, 2008 11:23 AM  Date/Time Reported: June 17, 2008 11:46 AM June 17, 2008 1:51 PM   HGBA1C: 6.6%   (Normal Range: Non-Diabetic - 3-6%   Control Diabetic - 6-8%) SED rate: 18  mm/hr  Comments: ...........test performed by...........[name] KitchenTerese Door, CMA ESR  ...............test performed by......[name] KitchenBonnie A. [place], [place] (ASCP)

## 2010-08-31 NOTE — Progress Notes (Signed)
Summary: med  Phone Note Outgoing Call Call back at [facility] Phone [phone]   Call placed by: Alanda Amass MD,  January 28, 2008 5:30 PM Call placed to: Patient Summary of Call: Left message on patient's machine asking her to call us and let us know what she uses the Flucocinonide cream for. It is a high potency steroid and she should not be using this continuously. I have received 2 refill requests. She has been prescribed triamcinolone cream and hydrocortisone 2.5 % in the past. I would like to know if these work for her at all.  Initial call taken by: Alanda Amass MD,  January 28, 2008 5:32 PM  Follow-up for Phone Call        Pt states she has a rash that flares up on her torso and thighs.  States she tried the triamcinolone and hydrocortisone and it was not very effective.  States then she was put on the flucocinoide and it was very effective- rash goes away after a couple of days of using cream.  Pt states she would like to have cream on hand in case the rash flares up.   Follow-up by: AMY MARTIN RN,  January 29, 2008 9:24 AM  Additional Follow-up for Phone Call Additional follow up Details #1::        Please let her know I sent the Rx to her pharmacy. Thanks.  Additional Follow-up by: Alanda Amass MD,  January 31, 2008 9:46 AM    Additional Follow-up for Phone Call Additional follow up Details #2::    Left message on voicemail informing pt. that Rx has been sent in Follow-up by: Maurio Baize LPN,  January 31, 6212 9:49 AM  New/Updated Medications: FLUOCINONIDE 0.05 %  CREA (FLUOCINONIDE) Apply a small amount to affected area/rash two times a day as needed but only for a few days at a time.   Prescriptions: FLUOCINONIDE 0.05 %  CREA (FLUOCINONIDE) Apply a small amount to affected area/rash two times a day as needed but only for a few days at a time.  #60 x 0   Entered and Authorized by:   Alanda Amass MD   Signed by:   Alanda Amass MD on 01/31/2008   Method used:   Electronically  sent to ...       [facility]       [address]       [place], [place]  [address]       Ph: [phone]       Fax: [phone]   RxID:   [id number]

## 2010-08-31 NOTE — Progress Notes (Signed)
----   Converted from flag ---- ---- 05/21/2007 2:37 PM, Veronica Dike HOBBS MD wrote: Please let Ms Veronica Shaw know that she did have protein in her urine at her last visit and will need to start taking the Enalapril once a day. She was taking this in the past. I sent the prescription to her pharmacy. ------------------------------  left message on home phone for her to call back. work number is not hers    informed pt of the above message..............................WHITNEY POOLE, May 22, 2007 11:06AM.  Appended Document:  pt is requesting to speak with rn re: this rx, she sts it causes her to have a dry cough and itch, she can't sleep at night, would like something different, pt can be reached at [phone]  Appended Document:  Spoke with patient regarding allergy to ACE. Will try to start Losartan 50 mg daily. However patient is unable to afford this. Will see if can get this through the MAP program here. Will send message to Imelda Pillow.

## 2010-08-31 NOTE — Assessment & Plan Note (Signed)
Summary: FU APPT   Vital Signs:  Patient profile:   60 year old female BP sitting:   146 / 74  Vitals Entered By: Lillia Pauls CMA (August 17, 2009 1:58 PM)  Primary Care Provider:  Romero Belling MD   History of Present Illness: 60 yo here today for follow-up of right achilles tendintis.  Had ultrasound previously by Dr. Darrick Penna, thought to be partial tear of Achilles tendon.  Has been doing some exercises previously given, and has also been taking Meloxicam.  She could not tolerate nitroglycerin patches, only trying it twice due to blurred vision and headache.  Some improvement with OTC heel cups.  Can only wear open back shoes.  Patient notes some mild improvement but still feels very limited by pain.   Allergies: 1)  ! Ace Inhibitors 2)  Penicillin  Review of Systems      See HPI MS:  Denies joint pain, joint redness, joint swelling, loss of strength, and stiffness.  Physical Exam  General:  Well-developed,well-nourished,in no acute distress; alert,appropriate and cooperative throughout examination Msk:  pain on palpation 2 cm above insertion of Achilles tendon.  Minimal pain on palpation of heel but no evidence of plantar fasciitis.  Ultrasound of Achilles tendons shows widened tendon with nodule again noted but no significant swelling   Impression & Recommendations:  Problem # 1:  ACHILLES BURSITIS OR TENDINITIS (ICD-726.71) Continued symptoms despite conservative treatment.  Will refer to physical therapy for further treatment. I think she needs aggressive PT and is unable to do that on her own.f/u 4-6 weeks  Complete Medication List: 1)  Bayer Childrens Aspirin 81 Mg Chew (Aspirin) .... Take 1 tablet by mouth once a day 2)  Metformin Hcl 500 Mg Tabs (Metformin hcl) .... Take 1 tablet by mouth twice a day 3)  Albuterol Sulfate (2.5 Mg/34ml) 0.083% Nebu (Albuterol sulfate) .... Use one ampule in nebulizer 4 times a day as needed 4)  Nitroquick 0.4 Mg Subl  (Nitroglycerin) .... Take one tablet and place under tongue if develop chest pain 5)  Cozaar 100 Mg Tabs (Losartan potassium) .... Take one tablet daily 6)  Voltaren 1 % Gel (Diclofenac sodium) .... Apply to heel 4 times daily x4 weeks.  dispense 3 x 100 gram tube. 7)  Loratadine 10 Mg Tabs (Loratadine) .[name] Kitchen.. 1 tab by mouth daily for allergies 8)  Nitroglycerin 0.2 Mg/hr Pt24 (Nitroglycerin) .... Use 1/4 patch daily as directed 9)  Doxycycline Hyclate 100 Mg Caps (Doxycycline hyclate) .[name] Kitchen.. 1 cap by mouth two times a day x4 weeks for rosacea 10)  Voltaren 1 % Gel (Diclofenac sodium) .[name] Kitchen.. 1 gram into affected areas qid 11)  Tramadol Hcl 50 Mg Tabs (Tramadol hcl) .[name] Kitchen.. 1 tab by mouth qhs as needed severe pain 12)  Meloxicam 7.5 Mg Tabs (Meloxicam) .[name] Kitchen.. 1 tab by mouth daily for heel pain

## 2010-08-31 NOTE — Miscellaneous (Signed)
Summary: MAP MEDS RECV'D  Clinical Lists Changes  RECV'D FROM [facility]:  COZAAR - 1 BTL(#90), 50MG , LOT #[id number], EXP. 02/10/2009 LMOVM

## 2010-08-31 NOTE — Assessment & Plan Note (Signed)
Summary: UTI and Asthma flare   Vital Signs:  Patient profile:   60 year old female Height:      62.5 inches Weight:      142 pounds BMI:     25.65 BSA:     1.66 O2 Sat:      95 % on Room air Temp:     98.0 degrees F Pulse rate:   118 / minute BP sitting:   166 / 91  Vitals Entered By: [name] CMA (September 29, 2009 10:35 AM)  O2 Flow:  Room air  Serial Vital Signs/Assessments:  Comments: 11:12 AM Post neb O2: 96% By: [name] CMA   CC: UTI-see triage note Pain Assessment Patient in pain? no        Primary Care Provider:  Romero Belling MD  CC:  UTI-see triage note.  History of Present Illness: 60yo F here for asthma flare and f/u UTI  Asthma flare: x 3 days.  Endorses wheezing and SOB.  States she had chest congestion last week but no fever, rhinorrhea, nasal congestion, or cough.  Thinks that her symptoms began after she started taking the Cipro for the UTI dx on Sunday at [facility].  She currently in on albuterol inhalers and nebs as needed.  Endorses intermittent night time awakenings and using her inhaler more frequently but hesistant to use it more than 2 times a day.  UTI: Dx at [facility] on Sunday.  Treated with Cipro which she refuses to take b/c she thinks it worsened her breathing.  She endorses urinary frequency, hematuria, and dysuria.  All the symptoms are improving but not resolved.  No N/V or flank pain or fever.  Habits & Providers  Alcohol-Tobacco-Diet     Tobacco Status: never  Allergies: 1)  ! Ace Inhibitors 2)  Penicillin  Review of Systems       No N/V or flank pain or fever.  Physical Exam  General:  VS Reviewed. Well appearing, NAD.  Lungs:  audible wheezes moving air in all lung fields but moderate wheezing throughout no crackles Heart:  RRR Abdomen:  Soft, NT, ND,  Skin:  nl color and turgor   Impression & Recommendations:  Problem # 1:  ASTHMA, UNSPECIFIED (ICD-493.90) Assessment Deteriorated Acute flare- not  requiring hospitalization. Suspect that it is not fromt he cipro as the patients thinks but more likely underlying viral bronchitis. Albuterol neb provided in the clinic. Will start her on fluticasone inhaled today and f/u in 2 weeks with Dr. Constance Goltz to reassess. If wheezing continues, will advise to schedule albuterol inhaler every 4 hours for the next 2 days. No abx or oral prednisone given the lack of severity of symptoms.  Her updated medication list for this problem includes:    Albuterol Sulfate (2.5 Mg/71ml) 0.083% Nebu (Albuterol sulfate) ..... Use one ampule in nebulizer 4 times a day as needed    Flovent Diskus 100 Mcg/blist Aepb (Fluticasone propionate (inhal)) ..... Inhale two times a day  Orders: Pulse Oximetry- [facility] (94760)  Problem # 2:  UTI (ICD-599.0) Assessment: Improved s/p 2 1/2 days of Cipro.  She is hesitant to finish the course b/c she is concerned about worsening her breathing even after reassurance. She is still symptomatic but improving. Will complete a treatment of bactrim instead.  The following medications were removed from the medication list:    Doxycycline Hyclate 100 Mg Caps (Doxycycline hyclate) .[name] Kitchen... 1 cap by mouth two times a day x4 weeks for rosacea Her  updated medication list for this problem includes:    Bactrim Ds 800-160 Mg Tabs (Sulfamethoxazole-trimethoprim) .[name] Kitchen... 1 tab by mouth two times a day x 3 days disp generic  Complete Medication List: 1)  Bayer Childrens Aspirin 81 Mg Chew (Aspirin) .... Take 1 tablet by mouth once a day 2)  Metformin Hcl 500 Mg Tabs (Metformin hcl) .... Take 1 tablet by mouth twice a day 3)  Albuterol Sulfate (2.5 Mg/33ml) 0.083% Nebu (Albuterol sulfate) .... Use one ampule in nebulizer 4 times a day as needed 4)  Nitroquick 0.4 Mg Subl (Nitroglycerin) .... Take one tablet and place under tongue if develop chest pain 5)  Cozaar 100 Mg Tabs (Losartan potassium) .... Take one tablet daily 6)  Loratadine 10 Mg Tabs  (Loratadine) .[name] Kitchen.. 1 tab by mouth daily for allergies 7)  Nitroglycerin 0.2 Mg/hr Pt24 (Nitroglycerin) .... Use 1/4 patch daily as directed 8)  Voltaren 1 % Gel (Diclofenac sodium) .[name] Kitchen.. 1 gram into affected areas qid 9)  Tramadol Hcl 50 Mg Tabs (Tramadol hcl) .[name] Kitchen.. 1 tab by mouth qhs as needed severe pain 10)  Meloxicam 7.5 Mg Tabs (Meloxicam) .[name] Kitchen.. 1 tab by mouth daily for heel pain 11)  Flovent Diskus 100 Mcg/blist Aepb (Fluticasone propionate (inhal)) .... Inhale two times a day 12)  Bactrim Ds 800-160 Mg Tabs (Sulfamethoxazole-trimethoprim) .[name] Kitchen.. 1 tab by mouth two times a day x 3 days disp generic  Other Orders: Urinalysis-[facility] (00000) Albuterol Sulfate Sol 1mg  unit dose (K4401) Nebulizer- [facility] (02725)  Patient Instructions: 1)  Follow up with Dr. Constance Goltz in 2 weeks to reassess Asthma after starting the inhaled steroid. Prescriptions: BACTRIM DS 800-160 MG TABS (SULFAMETHOXAZOLE-TRIMETHOPRIM) 1 tab by mouth two times a day x 3 days disp generic  #6 x 0   Entered and Authorized by:   Marisue Ivan  MD   Signed by:   Marisue Ivan  MD on 09/29/2009   Method used:   Electronically to        [facility] (retail)       [address]       [place], [place]  [address]       Ph: [phone]       Fax: [phone]   RxID:   [id number] FLOVENT DISKUS 100 MCG/BLIST AEPB (FLUTICASONE PROPIONATE (INHAL)) inhale two times a day  #1 x 1   Entered and Authorized by:   Marisue Ivan  MD   Signed by:   Marisue Ivan  MD on 09/29/2009   Method used:   Electronically to        [facility] (retail)       [address]       [place], [place]  [address]       Ph: [phone]       Fax: [phone]   RxID:   [id number]    Medication Administration  Medication # 1:    Medication: Albuterol Sulfate Sol 1mg  unit dose    Diagnosis: ASTHMA, UNSPECIFIED (ICD-493.90)    Dose: 2.5mg     Route: inhaled    Exp Date:  04/02/2011    Lot #: [id number]    Mfr: nephron    Patient tolerated medication without complications    Given by: [name] CMA (September 29, 2009 11:11 AM)  Orders Added: 1)  Urinalysis-[facility] [00000] 2)  Pulse Oximetry- [facility] [62703] 3)  Albuterol Sulfate  Sol 1mg  unit dose [J7613] 4)  Nebulizer- [facility] [04540]  Appended Document: Charges    Clinical Lists Changes  Orders: Added new Test order of [facility]- Est Level  3 (98119) - Signed

## 2010-08-31 NOTE — Progress Notes (Signed)
Summary: [employer]  Phone Note Call from Patient Call back at [facility] Phone [phone]   Summary of Call: is requesting her rx's be delivered by [facility] - all rx's Initial call taken by: Haydee Salter,  February 08, 2007 3:06 PM  Follow-up for Phone Call        LMOVM for pt to call back Follow-up by: [facility] CMA,  February 09, 2007 10:39 AM  Additional Follow-up for Phone Call Additional follow up Details #1::        LMOVM again Additional Follow-up by: [facility] CMA,  February 12, 2007 12:08 PM

## 2010-08-31 NOTE — Miscellaneous (Signed)
  Clinical Lists Changes  Problems: Changed problem from ASTHMA, UNSPECIFIED (ICD-493.90) to ASTHMA, PERSISTENT (ICD-493.90) 

## 2010-08-31 NOTE — Progress Notes (Signed)
Summary: needs orders  Phone Note Call from Patient   Summary of Call: pt needs an MMR for a job and thinks she has had a shot... coming in tomorrow to have titer drawn and needs orders placed. thanks Initial call taken by: De Nurse,  September 22, 2009 1:43 PM  Follow-up for Phone Call        This is fine.  When she comes okay to offer her the MMR vaccination if she would prefer (please accept this signed note as a physician's order if she reqeusts). If she declines MMR, okay to draw titers for measles, mumps, and rubella.  I have entered future orders for this. Follow-up by: Romero Belling MD,  September 22, 2009 3:11 PM  New Problems: NEED [facility] VACC W/MEASLES-MUMPS-RUBELLA VACCINE (ICD-V06.4)   New Problems: NEED PROPH VACC W/MEASLES-MUMPS-RUBELLA VACCINE (ICD-V06.4)

## 2010-08-31 NOTE — Progress Notes (Signed)
Summary: refill  Phone Note Refill Request Call back at Home Phone [phone] Call back at [phone] Message from:  Patient  Refills Requested: Medication #1:  METFORMIN HCL 500 MG TABS Take 1 tablet by mouth twice a day Initial call taken by: De Nurse,  Dec 19, 2008 12:33 PM  Follow-up for Phone Call        Please have patient make an appointment to follow up her DM.  Follow-up by: Cyndia Bent MD,  Dec 19, 2008 1:37 PM  Additional Follow-up for Phone Call Additional follow up Details #1::        Patient informed of above, states she doesnt like coming that often b/c she owes so much money to the clinic and she doesnt want to add more but she would try and schedule an appt. Additional Follow-up by: Garen Grams LPN,  Dec 19, 2008 3:02 PM      Prescriptions: METFORMIN HCL 500 MG TABS (METFORMIN HCL) Take 1 tablet by mouth twice a day  #90 x 0   Entered and Authorized by:   Cyndia Bent MD   Signed by:   Cyndia Bent MD on 12/19/2008   Method used:   Electronically to        [facility] (retail)       [address]       [place], [place]  [address]       Ph: [phone]       Fax: [phone]   RxID:   [id number]

## 2010-08-31 NOTE — Assessment & Plan Note (Signed)
Summary: fu/el   Vital Signs:  Patient Profile:   60 Years Old Female Height:     62.5 inches (158.75 cm) Weight:      145.8 pounds (66.27 kg) BMI:     26.34 Temp:     99 degrees F (37.22 degrees C) Pulse rate:   94 / minute BP sitting:   121 / 73  (left arm)  Pt. in pain?   no  Vitals Entered By: Tomasa Rand (January 24, 2007 10:41 AM)              Is Patient Diabetic? Yes    PCP:  Alanda Amass MD  Chief Complaint:  F/U visit.  History of Present Illness: Ms Veronica Shaw comes today for follow up she is feeling well today. Her issues are as follow:  1) DM: Fasting CBGs (am) in the 140s range. Taking metformin 500mg  two times a day. Denies Polyuria, polydipsia and polyphagia. No hypoglycemic events. No current skin brakes ulcer or infections. No dysuria. Takes daily ASA.  2) HTM: No chest pain. No SOB, no leg sweeling, no headaches, no visual changes from base line. Taking Dyazide37.5/25mg   and enalapril 5mg . She was scheduled for a ETT she never had this done as her chest dyscomfort completely resolved after starting her anxiety medication.  3) Anxiety: Much improved after taking Sertraline 50mg  now taking for 7months.  4)Hyperlipidemia: Had recent check of chlolesterol was ok. not taking any meds for it right now because she fills medications as [facility] and [facility] are too expensive.  5) Health maintenance: As per patient report [facility]. Also hs had a normal mammogram less than one year ago.  Other issues: Has h/o of asthma uses duonebs once or twice a month will like refills.      Risk Factors:  Tobacco use:  never    Physical Exam  General:     Well-developed,well-nourished,in no acute distress; alert,appropriate and cooperative throughout examination Eyes:     No corneal or conjunctival inflammation noted. EOMI. Perrla. limited Funduscopic exam benign, without hemorrhages, exudates or papilledema. Vision  grossly normal. Ears:     R ear normal and L ear normal.   Mouth:     Oral mucosa and oropharynx without lesions or exudates.  Teeth in good repair. Neck:     No deformities, masses, or tenderness noted. Lungs:     Normal respiratory effort, chest expands symmetrically. Lungs are clear to auscultation, no crackles or wheezes. Heart:     Normal rate and regular rhythm. S1 and S2 normal without gallop, murmur, click, rub or other extra sounds. Pulses:     R and L carotid, dorsalis pedis and posterior tibial pulses are full and equal bilaterally Extremities:     No clubbing, cyanosis, edema, or deformity noted with normal full range of motion of all joints.  Normal monofilament test. No foot infections. Neurologic:     alert & oriented X3, cranial nerves II-XII intact, and sensation intact to light touch.   Psych:     Cognition and judgment appear intact. Alert and cooperative with normal attention span and concentration. No apparent delusions, illusions, hallucinations    Impression & Recommendations:  Problem # 1:  DIABETES MELLITUS II, UNCOMPLICATED (ICD-250.00) Assessment: Improved Controled. Continue metformin 500mg  two times a day and daily ASA. Explained to patient that her statin was changed to pravastatin to fit the [facility] formullary she needs to take 40mg  daily. discussed  diet and exercise  Orders: A1C-[facility] (98921) Direct LDL-[facility] ([id number]) [facility]- Est Level  3 (48185)   Problem # 2:  HYPERTENSION, BENIGN SYSTEMIC (ICD-401.1) Assessment: Improved good control. Continue current regime.  Orders: Direct LDL-[facility] [phone]) Comp Met-[facility] [phone]) [facility]- Est Level  3 (41287)   Problem # 3:  ASTHMA, UNSPECIFIED (ICD-493.90) Assessment: Comment Only Stable. refilled dounebs to use three times a day as needed. She will schedule a visit if frequent synptoms.  consider spirometry to better clasify.  Orders: [facility]- Est Level  3 (86767)   Problem # 4:  ANXIETY  STATE NOS (ICD-300.00) Assessment: Improved Improved continue sertraline 50mg  daily.   Patient Instructions: 1)  Your diabetes and blood pressure are well controlled. 2)  Continue your current medications. 3)  Remember to follow a healthy diet and daily exercise. 4)  Return in 3 months for follow up and meet your new doctor.     Laboratory Results   Blood Tests   Date/Time Recieved: January 24, 2007 10:45 AM  Date/Time Reported: January 24, 2007 11:28 AM   HGBA1C: 6.7%   (Normal Range: Non-Diabetic - 3-6%   Control Diabetic - 6-8%)  Comments: ...................................................................[name]  January 24, 2007 11:28 AM

## 2010-08-31 NOTE — Miscellaneous (Signed)
Summary: MAP MEDS RECV'D  Clinical Lists Changes  RECV'D FROM [facility]:  COZAAR, 1 BTL (#90), 50MG , LOT #[id number], EXP. 09/24/2008. PT NOTIFIED.

## 2010-08-31 NOTE — Assessment & Plan Note (Signed)
Summary: FU HEEL PAIN,MC   Vital Signs:  Patient profile:   60 year old female BP sitting:   144 / 71  Vitals Entered By: Lillia Pauls CMA (July 22, 2009 1:39 PM)  Primary Provider:  Romero Belling MD   History of Present Illness: Persistent AT pain. Right AT pain initially responded to BID voltaren gel use and ice application.  Stopped voltaren use after 2 weeks as pain improved. Inconsistently performed rehabilitation exercises for few weeks. Pain worsened in the interim. Now having bilateral AT and right PF pain.  Of note, patient has long-standing history of intermittent back pain with occasional RLE radiculopathy. No past MRI 2/2 financial limitations. No saddle anesthesia, numbness, tingling.  No recent change in BG values.  Allergies: 1)  ! Ace Inhibitors 2)  ! Meloxicam (Meloxicam) 3)  Penicillin PMH-FH-SH reviewed for relevance  Physical Exam  General:  Well-developed,well-nourished,in no acute distress; alert,appropriate and cooperative throughout examination Msk:  BACK: Mild gross limitation wrt ROM at the waist 2/2 reproduced low back dyscomfort.  HIPS/KNEES/ANKLES/FEET: FROM. Full strength except for mild relative weakness on plantarflexion/eversion. Mild ttp along both AT, most notably above the calcaneal insertions; without swelling or apparent defect. Moderate ttp along the prox-med right PF.  LEG LENGTHS: Equal  Neurologic:  (+) sitting and supine SLE bilaterally. 1+ DTR throughout RLE vs 2+ DTR throughout LLE. Mildly decreased sensation throughout distal RLE.   Impression & Recommendations:  Problem # 1:  ACHILLES BURSITIS OR TENDINITIS (ICD-726.71)  - Resume voltaren gel use, 4 times daily. - Start tramadol. - Felt heel lifts provided to the patient. - Apply ice to the affected areas for 20 minutes per day. - Daily AT/PF stretches as tolerated, including reverse heel walking. Stretches/exercises demonstrated to the patient during this  encounter. - Wear tennis shoes as current walking shoes offer minimal arch and heel support. - RTC in 3-4 wks or sooner as needed re-assessment.  Problem # 2:  PLANTAR FASCIITIS, RIGHT (ICD-728.71)  - Per item #1  Problem # 3:  BACK PAIN WITH RADICULOPATHY (ICD-729.2)  - Pt advised to undergo MRI given possible etiologies of her radicular back pain. She declined MRI 2/2 her financial limitations.  - Immediately seek medical attention for saddle anesthesia, incontinence, numbness, tingling, LE weakness, or any other concerns. - Referred to PT for formal rehabilitation. - RTC in 3-4 wks.  Problem # 4:  DIABETES MELLITUS II, UNCOMPLICATED (ICD-250.00)  - Maintain close f/u with her PCP.  Her updated medication list for this problem includes:    Bayer Childrens Aspirin 81 Mg Chew (Aspirin) .[name] Kitchen... Take 1 tablet by mouth once a day    Metformin Hcl 500 Mg Tabs (Metformin hcl) .[name] Kitchen... Take 1 tablet by mouth twice a day    Cozaar 100 Mg Tabs (Losartan potassium) .[name] Kitchen... Take one tablet daily  Problem # 5:  DYSLIPIDEMIA (ICD-272.4)  - Maintain close f/u with her PCP.  Problem # 6:  HYPERTENSION, BENIGN SYSTEMIC (ICD-401.1)  - Maintain close f/u with her PCP.  Her updated medication list for this problem includes:    Cozaar 100 Mg Tabs (Losartan potassium) .[name] Kitchen... Take one tablet daily  Complete Medication List: 1)  Bayer Childrens Aspirin 81 Mg Chew (Aspirin) .... Take 1 tablet by mouth once a day 2)  Metformin Hcl 500 Mg Tabs (Metformin hcl) .... Take 1 tablet by mouth twice a day 3)  Albuterol Sulfate (2.5 Mg/5ml) 0.083% Nebu (Albuterol sulfate) .... Use one ampule in nebulizer 4 times a  day as needed 4)  Nitroquick 0.4 Mg Subl (Nitroglycerin) .... Take one tablet and place under tongue if develop chest pain 5)  Cozaar 100 Mg Tabs (Losartan potassium) .... Take one tablet daily 6)  Voltaren 1 % Gel (Diclofenac sodium) .... Apply to heel 4 times daily x4 weeks.  dispense 3 x 100 gram tube.  7)  Loratadine 10 Mg Tabs (Loratadine) .[name] Kitchen.. 1 tab by mouth daily for allergies 8)  Nitroglycerin 0.2 Mg/hr Pt24 (Nitroglycerin) .... Use 1/4 patch daily as directed 9)  Doxycycline Hyclate 100 Mg Caps (Doxycycline hyclate) .[name] Kitchen.. 1 cap by mouth two times a day x4 weeks for rosacea 10)  Voltaren 1 % Gel (Diclofenac sodium) .[name] Kitchen.. 1 gram into affected areas qid 11)  Tramadol Hcl 50 Mg Tabs (Tramadol hcl) .[name] Kitchen.. 1 tab by mouth qhs as needed severe pain Prescriptions: TRAMADOL HCL 50 MG TABS (TRAMADOL HCL) 1 tab by mouth qHS as needed severe pain  #30 x 0   Entered and Authorized by:   Valarie Merino MD   Signed by:   Valarie Merino MD on 07/22/2009   Method used:   Electronically to        [facility] (retail)       [address]       [place], [place]  [address]       Ph: [phone]       Fax: [phone]   RxID:   [id number] VOLTAREN 1 % GEL (DICLOFENAC SODIUM) 1 gram into affected areas QID  #240 gm x 0   Entered and Authorized by:   Valarie Merino MD   Signed by:   Valarie Merino MD on 07/22/2009   Method used:   Electronically to        [facility] (retail)       [address]       [place], [place]  [address]       Ph: [phone]       Fax: [phone]   RxID:   [id number]

## 2010-08-31 NOTE — Progress Notes (Signed)
Summary: phn msg  Phone Note Call from Patient Call back at [phone]   Caller: Patient Summary of Call: Would like to see if she can get a extended handicap parking plate.  Follow-up for Phone Call        Handicap Placard Application signed and waiting for her at the front desk. Follow-up by: Romero Belling MD,  October 29, 2009 5:17 PM  Additional Follow-up for Phone Call Additional follow up Details #1::        Pt notified that handicap placcard ready. Additional Follow-up by: Clydell Hakim,  October 30, 2009 10:59 AM

## 2010-08-31 NOTE — Assessment & Plan Note (Signed)
Summary: right heel pain   Vital Signs:  Patient profile:   61 year old female Height:      62.5 inches Weight:      142.1 pounds BMI:     25.67 Temp:     98.4 degrees F oral Pulse rate:   90 / minute BP sitting:   156 / 79  (left arm) Cuff size:   regular  Vitals Entered By: Garen Grams LPN (March 27, 2009 1:35 PM) CC: right heel pain Is Patient Diabetic? Yes  Pain Assessment Patient in pain? yes     Location: right leg Intensity: 8   Primary Care Provider:  Alanda Amass MD  CC:  right heel pain.  History of Present Illness: 60 yo female I am meeting for the first time presenting with 4 month history of right heel pain.  Used to wear closed back sneakers but has had to step secondary to pain.  Pain is moderate, relieved by APAP, and is daily, all day.  No trauma to heel.  Associated with muscle pain/cramps in back of leg.  Habits & Providers  Alcohol-Tobacco-Diet     Tobacco Status: never  Current Problems (verified): 1)  Diabetes Mellitus II, Uncomplicated  (ICD-250.00) 2)  Hypertension, Benign Systemic  (ICD-401.1) 3)  Asthma, Unspecified  (ICD-493.90) 4)  Chest Pain  (ICD-786.50) 5)  Dyslipidemia  (ICD-272.4)  Current Medications (verified): 1)  Bayer Childrens Aspirin 81 Mg Chew (Aspirin) .... Take 1 Tablet By Mouth Once A Day 2)  Metformin Hcl 500 Mg Tabs (Metformin Hcl) .... Take 1 Tablet By Mouth Twice A Day 3)  Albuterol Sulfate (2.5 Mg/63ml) 0.083%  Nebu (Albuterol Sulfate) .... Use One Ampule in Nebulizer 4 Times A Day As Needed 4)  Nitroquick 0.4 Mg  Subl (Nitroglycerin) .... Take One Tablet and Place Under Tongue If Develop Chest Pain 5)  Cozaar 100 Mg Tabs (Losartan Potassium) .... Take One Tablet Daily  Allergies (verified): 1)  ! 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  Originally from [place]  Has lived in [place] since 1954.  Moved to [place] from [place].  Has daughter in the area and son in [place].   Former Public house manager.  Currently a private duty nurse 3 hours/day.  No EtOH/tobacco/illicit drug use.  Physical Exam  General:  Well-developed,well-nourished,in no acute distress; alert,appropriate and cooperative throughout examination Extremities:  5/5 strength, 2+ patellar DTRs, 2+ DP pulses bilateral lower extremities.  No atrophy.   Impression & Recommendations:  Problem # 1:  ACHILLES BURSITIS OR TENDINITIS (ICD-726.71) Assessment New  See patient instructions for plan.  Orders: [facility]- Est Level  3 ([address])  Complete Medication List: 1)  Bayer Childrens Aspirin 81 Mg Chew (Aspirin) .... Take 1 tablet by mouth once a day 2)  Metformin Hcl 500 Mg Tabs (Metformin hcl) .... Take 1 tablet by mouth twice a day 3)  Albuterol Sulfate (2.5 Mg/38ml) 0.083% Nebu (Albuterol sulfate) .... Use one ampule in nebulizer 4 times a day as needed 4)  Nitroquick 0.4 Mg Subl (Nitroglycerin) .... Take one tablet and place under tongue if develop chest pain 5)  Cozaar 100 Mg Tabs (Losartan potassium) .... Take one  tablet daily 6)  Meloxicam 7.5 Mg Tabs (Meloxicam) .[name] Kitchen.. 1 tab by mouth daily for heel pain  Patient Instructions: 1)  Pleasure to meet you today. 2)  You have achilles bursitis. 3)  Meloxicam every morning with a meal for the pain.  If you cannot tolerate Meloxicam, you can use Acetaminophen 1000 mg by mouth three times a day. 4)  Contrast baths times times a day:  Soak foot in cold water (as cold as can be tolerated, goal ice water) for 30 seconds, alternate with soaking foot in warm water (as warm as can be tolerated, goal 104 degrees), alternate every 30 seconds for 5 minutes beginning and ending with the cold water.  5)  Do the heel stretches we discussed with a 1/2 pad under your heel. 6)  Use open back shoes. 7)  Please schedule a follow-up appointment in 3 weeks.  Prescriptions: MELOXICAM 7.5 MG TABS (MELOXICAM) 1 tab by mouth daily for heel pain  #90 x 0   Entered and Authorized by:   Romero Belling MD   Signed by:   Romero Belling MD on 03/27/2009   Method used:   Electronically to        [facility] (retail)       [address]       [place], [place]  [address]       Ph: [phone]       Fax: [phone]   RxID:   [id number]

## 2010-08-31 NOTE — Progress Notes (Signed)
  Phone Note From Pharmacy   Caller: [facility] Summary of Call: Mindi Junker, with [facility] pharmacy called regarding rx given to [name] today. Have concerned about strenghth of Nitro patch being cut. Would like to speak with physican regarding this.  You can reach her at [phone] Initial call taken by: Abundio Miu  Follow-up for Phone Call        called pharmacy and fixed the issue Follow-up by: Lillia Pauls CMA,  May 14, 2009 4:27 PM

## 2010-08-31 NOTE — Progress Notes (Signed)
Summary: refill  Phone Note Refill Request Call back at [phone] Message from:  Patient  Refills Requested: Medication #1:  COZAAR 100 MG TABS Take one tablet daily pt has been out for 2 weeks and needs asap  Initial call taken by: De Nurse,  June 09, 2010 11:28 AM  Follow-up for Phone Call        pt has recieved 2 letters that she is to come in for follow up.  I will send in one month.   Follow-up by: Ellery Plunk MD,  June 10, 2010 4:29 PM    Prescriptions: COZAAR 100 MG TABS (LOSARTAN POTASSIUM) Take one tablet daily  #30 x 1   Entered and Authorized by:   Ellery Plunk MD   Signed by:   Ellery Plunk MD on 06/10/2010   Method used:   Electronically to        [facility] (retail)       [address]       [place], [place]  [address]       Ph: [phone]       Fax: [phone]   RxID:   [id number]

## 2010-08-31 NOTE — Assessment & Plan Note (Signed)
Summary: HEEL PAIN/MJD   Vital Signs:  Patient profile:   60 year old female BP sitting:   142 / 70  Vitals Entered By: Lillia Pauls CMA (May 14, 2009 1:46 PM)  Primary Provider:  Romero Belling MD   History of Present Illness: Pleasant lady with hx of RT post heel pain this radiates up back of her leg hurts at nite hurts w shoes that put pressure on heel  also has hx of lefi sciatica she states that this pain feels difft  given conservative care still painful and sent to [facility] for opinion  Allergies: 1)  ! Ace Inhibitors 2)  ! Meloxicam (Meloxicam) 3)  Penicillin  Physical Exam  General:  Well-developed,well-nourished,in no acute distress; alert,appropriate and cooperative throughout examination Msk:  TTP at insertion of RT AT this area feels puffy to palpation pain on single calf raise pain on resisted PF  SLR is + but only mildly so and at 45 deg  MSK [place] there is calcific degeneration of insertion of RT AT into heel this is minimally present on LT by comparison The tendon is thickened at 0.60 vs 0.39 on opp site on trans scan there are multiple samll insertional tears there is marked increased blood flow on doppler and neovessels images saved   Impression & Recommendations:  Problem # 1:  ACHILLES BURSITIS OR TENDINITIS (ICD-726.71)  This represents an enthesiopathy with partial tearing we will try to treat aggressivly as risk of insertional rupture is moderately high trial on NTG patch  followup q 6 wks with repeat US scans  Orders: US EXTREMITY NON-VASC REAL-TIME IMG ([id number])  Problem # 2:  DIABETES MELLITUS II, UNCOMPLICATED (ICD-250.00)  Her updated medication list for this problem includes:    Bayer Childrens Aspirin 81 Mg Chew (Aspirin) .[name] Kitchen... Take 1 tablet by mouth once a day    Metformin Hcl 500 Mg Tabs (Metformin hcl) .[name] Kitchen... Take 1 tablet by mouth twice a day    Cozaar 100 Mg Tabs (Losartan potassium) .[name] Kitchen... Take one tablet daily  this may  be a contributing factor in above soft tissue injury  Orders: US EXTREMITY NON-VASC REAL-TIME IMG ([id number])  Complete Medication List: 1)  Bayer Childrens Aspirin 81 Mg Chew (Aspirin) .... Take 1 tablet by mouth once a day 2)  Metformin Hcl 500 Mg Tabs (Metformin hcl) .... Take 1 tablet by mouth twice a day 3)  Albuterol Sulfate (2.5 Mg/23ml) 0.083% Nebu (Albuterol sulfate) .... Use one ampule in nebulizer 4 times a day as needed 4)  Nitroquick 0.4 Mg Subl (Nitroglycerin) .... Take one tablet and place under tongue if develop chest pain 5)  Cozaar 100 Mg Tabs (Losartan potassium) .... Take one tablet daily 6)  Voltaren 1 % Gel (Diclofenac sodium) .... Apply to heel 4 times daily x4 weeks.  dispense 3 x 100 gram tube. 7)  Loratadine 10 Mg Tabs (Loratadine) .[name] Kitchen.. 1 tab by mouth daily for allergies 8)  Nitroglycerin 0.2 Mg/hr Pt24 (Nitroglycerin) .... Use 1/4 patch daily as directed  Patient Instructions: 1)  keep using heel lift 2)  you have a partial tear of your achilles tendon where it inserts on heel bone 3)  you should place 1/4 NTG patch over this area each day 4)  this helps improve circulation and heeling of tendon 5)  ice once daily to heel 6)  do a simple exercise on a book as shown 7)  6 times and then do 3 sets of these twice a day 8)  then recheck with me in 6 weeks Prescriptions: NITROGLYCERIN 0.2 MG/HR PT24 (NITROGLYCERIN) use 1/4 patch daily as directed  #10 patches x 3   Entered and Authorized by:   Enid Baas MD   Signed by:   Enid Baas MD on 05/14/2009   Method used:   Electronically to        [facility] (retail)       [address]       [place], [place]  [address]       Ph: [phone]       Fax: [phone]   RxID:   [id number]

## 2010-08-31 NOTE — Progress Notes (Signed)
Summary: triage  Phone Note Call from Patient Call back at Home Phone [phone]   Caller: Patient Summary of Call: Pt was seen at Urgent Care over weekend for UTI and the medication they put her on she feels like is interfering with her asthma.  Has not taken the medication this am. Initial call taken by: Clydell Hakim,  September 29, 2009 9:29 AM  Follow-up for Phone Call        got cipro from [facility] on Sunday. stopped it today since she feels it is interfering with her asthma. to come now for a work in appt Follow-up by: Golden Circle RN,  September 29, 2009 9:37 AM

## 2010-08-31 NOTE — Progress Notes (Signed)
Summary: Referral Form - Physical Therapy  Referral Form - Physical Therapy   Imported By: Marily Memos 08/17/2009 15:29:13  _____________________________________________________________________  External Attachment:    Type:   Image     Comment:   External Document

## 2010-08-31 NOTE — Assessment & Plan Note (Signed)
Summary: 3 month fu wp   Vital Signs:  Patient Profile:   60 Years Old Female Height:     62.5 inches (158.75 cm) Weight:      147.5 pounds BMI:     26.64 Temp:     98.6 degrees F Pulse rate:   125 / minute BP sitting:   131 / 74  Pt. in pain?   no  Vitals Entered By: Golden Circle RN (May 17, 2007 1:56 PM)              Is Patient Diabetic? Yes  CBG Result 122     PCP:  Vona Whiters HOBBS MD  Chief Complaint:  routine dm check & flu shot.  History of Present Illness: 60 yo female with DM, HTN, proteinuria, asthma, hx of anxiety here complaining of itching and to follow up other medical problems.   1. Itching - Has been itching since January. 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 She is afraid she may have gotten the same thing.  Itching occurs mainly at night when she is off work and not doing something. Has also been getting bumps. Denies any possibity they might be bug bites. Has a dog but says he doesn't have flees. Has already tried to wash all her clothes and bedding. Has not changed detergents. Worried she might have something called Morgellons disease - read about it and heard about it on TV. Today she comes with a flier regarding this disease. Has tried benadryl and calamine lotion but do not help. Friend gave her Clobetasol topical lotion and she is using this two times a day.   2. DM - Has trace microalbuminuria today. Stopped her ACEI on her own previously because thought it was for something else. Sugars have been between 140s-390s fasting. Thinks her blood glucose monitor may not be working right.   3. HTN - Denies CP, SOB, vision changes, HAs, edema. Continues on dyazide.  4. Anxiety - says she stopped the Sertraline because not having any more palpitations. Does not think she has a problem with anxiety now.   5. Asthma - well controlled. Used duonebs x 2 this month. Wants a PNA shot today. Received flu shot today.   Current Allergies: PENICILLIN  Past Medical History:    1+ microalbuminuria 11/06, Trace microalbuminuria 10/08    H/o abnormal PAP      Physical Exam  General:     alert, well-developed, and well-nourished.   Head:     normocephalic and atraumatic.   Lungs:     normal respiratory effort, normal breath sounds, no crackles, and no wheezes.   Heart:     normal rate, regular rhythm, no murmur, no gallop, and no rub.   Abdomen:     soft, non-tender, no distention, and no masses.   Extremities:     No edema Psych:     slightly anxious.      Impression & Recommendations:  Problem # 1:  PRURITUS (ICD-698.9) Assessment: Unchanged May be due to contact dermatitis but some areas look like bug bites. Given Rx for Triamcinolone 0.025% cream since the cream she was given by her friend was a very high potency steroid. Also advised her to change detergents and wash clothes with something else. Having itching in areas that don't have a rash as well.  Orders: [facility]- Est  Level 4 ([address])   Problem # 2:  DIABETES MELLITUS II, UNCOMPLICATED (ICD-250.00) Assessment:  Unchanged Hgb AIC is 7.1 today up from 6.7. Still decently controlled. Will have her check her sugar when they check it in the lab to see if her monitor is working properly. Encouraged good diet and exercise.   The following medications were removed from the medication list:    Enalapril Maleate 5 Mg Tabs (Enalapril maleate) .[name] Kitchen... Take 1 tablet by mouth once a day  Her updated medication list for this problem includes:    Bayer Childrens Aspirin 81 Mg Chew (Aspirin) .[name] Kitchen... Take 1 tablet by mouth once a day    Metformin Hcl 500 Mg Tabs (Metformin hcl) .[name] Kitchen... Take 1 tablet by mouth twice a day  Orders: A1C-[facility] (43329) Glucose Cap-[facility] (51884) UA Microalbumin-[facility] (82044) [facility]- Est  Level 4 ([address])   Problem # 3:  HYPERTENSION, BENIGN SYSTEMIC (ICD-401.1) Assessment: Unchanged BP relatively well controlled. Due to  microalbuminuria will likely need to add back the Enalapril 5 mg daily. Her blood pressure could handle this as well.   The following medications were removed from the medication list:    Enalapril Maleate 5 Mg Tabs (Enalapril maleate) .[name] Kitchen... Take 1 tablet by mouth once a day  Her updated medication list for this problem includes:    Dyazide 37.5-25 Mg Caps (Triamterene-hctz) .[name] Kitchen... Take 1 capsule by mouth once a day  Orders: [facility]- Est  Level 4 (30160)   Problem # 4:  ANXIETY STATE NOS (ICD-300.00) Assessment: Improved Stopped this medication on her own and says she is not having problems with this, however she does seem like an anxious person and if she continues having the itching could consider adding this back if itching seems to be related to anxiety.  The following medications were removed from the medication list:    Sertraline Hcl 50 Mg Tabs (Sertraline hcl) .[name] Kitchen... Take 1 tablet by mouth once a day  Orders: [facility]- Est  Level 4 (10932)   Problem # 5:  DYSLIPIDEMIA (ICD-272.4) Assessment: Unchanged Last LDL was done 111 on 6/08. She stopped taking the Pravachol on her own. Will need to repeat this at her next appointment.   The following medications were removed from the medication list:    Pravastatin Sodium 80 Mg Tabs (Pravastatin sodium) .[name] Kitchen... Take 1/2 tablet by mouth once a day   Problem # 6:  ASTHMA, UNSPECIFIED (ICD-493.90) Assessment: Unchanged Well controlled.   Her updated medication list for this problem includes:    Duoneb 2.5-0.5 Mg/29ml Soln (Albuterol-ipratropium) ..... Inhale 3 ml as directed every four hours   Complete Medication List: 1)  Bayer Childrens Aspirin 81 Mg Chew (Aspirin) .... Take 1 tablet by mouth once a day 2)  Duoneb 2.5-0.5 Mg/77ml Soln (Albuterol-ipratropium) .... Inhale 3 ml as directed every four hours 3)  Dyazide 37.5-25 Mg Caps (Triamterene-hctz) .... Take 1 capsule by mouth once a day 4)  Flexeril 5 Mg Tabs (Cyclobenzaprine hcl) .[name] Kitchen.. 1  tablet by mouth as needed 5)  Metformin Hcl 500 Mg Tabs (Metformin hcl) .... Take 1 tablet by mouth twice a day 6)  Triamcinolone Acetonide 0.025 % Crea (Triamcinolone acetonide) .... Apply to itchy areas 2-3 times a day. disp 1 months supply  Other Orders: Flu Vaccine 57yrs + (35573) Admin 1st Vaccine (22025) State-Pneumococcal Vaccine (42706C) Admin of Any Addtl Vaccine (37628)   Patient Instructions: 1)  We will check your urine today for protein. 2)  I am prescribing you a steroid cream to use on your rash that is a lower strength from what your friend gave  you and not as dangerous if you use it for a longer time period.  3)  Please schedule a follow-up appointment in 1 -2 months so we can follow up on your rash and blood pressure. 4)  You will get your pneumonia shot today and flu shot if you haven't had this already. 5)  Continue eating well and exercising to help with your diabetes.    Prescriptions: TRIAMCINOLONE ACETONIDE 0.025 %  CREA (TRIAMCINOLONE ACETONIDE) Apply to itchy areas 2-3 times a day. Disp 1 months supply  #1 x 1   Entered and Authorized by:   Alanda Amass MD   Signed by:   Golden Circle RN on 05/17/2007   Method used:   Electronically sent to ...       [facility]       [address]       [place], [place]  [address]       Ph: [phone]       Fax: [phone]   RxID:   [id number]  ]  Vital Signs:  Patient Profile:   60 Years Old Female Height:     62.5 inches (158.75 cm) Weight:      147.5 pounds BMI:     26.64 Temp:     98.6 degrees F Pulse rate:   125 / minute BP sitting:   131 / 74             CBG Result 122    Pneumovax Vaccine    Vaccine Type: Pneumovax (State)    Site: right deltoid    Mfr: Merck    Dose: 0.5 ml    Route: IM    Given by: Golden Circle RN    Exp. Date: 12/21/2008    Lot #: [id number]    VIS given: 02/27/96 version given May 17, 2007.  Influenza Vaccine     Vaccine Type: Fluvax 3+    Site: left deltoid    Mfr: Sanofi Pasteur    Dose: 0.5 ml    Route: IM    Given by: Golden Circle RN    Exp. Date: 01/29/2008    Lot #: [id number]    VIS given: 01/28/05 version given May 17, 2007.  Flu Vaccine Consent Questions    Do you have a history of severe allergic reactions to this vaccine? no    Any prior history of allergic reactions to egg and/or gelatin? no    Do you have a sensitivity to the preservative Thimersol? no    Do you have a past history of Guillan-Barre Syndrome? no    Do you currently have an acute febrile illness? no    Have you ever had a severe reaction to latex? no    Vaccine information given and explained to patient? yes    Are you currently pregnant? no  Laboratory Results   Urine Tests  Date/Time Received: May 17, 2007 3:20 PM  Date/Time Reported: May 17, 2007 3:30 PM  Microalbumin (urine): trace mg/L    Comments: ...................................................................DONNA [facility]  May 17, 2007 3:30 PM   Blood Tests   Date/Time Received: May 17, 2007 1.58  PM  Date/Time Reported: May 17, 2007 2:08 PM   HGBA1C: 7.1%   (Normal Range: Non-Diabetic - 3-6%   Control Diabetic - 6-8%) CBG Random:: 122  Comments: ...............test performed by......[name] KitchenBonnie A. [place], [place] (ASCP) PATIENT'S MACHINE 117MG /DL ...................................................................DONNA [facility]  May 17, 2007 3:30 PM  Preventive Care Screening  Last Flu Shot:    Date:  05/17/2007    Next Due:  06/2008    Results:  given   Last Pneumovax:    Date:  05/17/2007    Results:  Pneumovax [facility])

## 2010-08-31 NOTE — Letter (Signed)
Summary: Generic Letter  [facility]  [address]   [place], [place] [address]   Phone: [phone]  Fax: [phone]    06/07/2010  FAWNA CRANMER [address] [place], [place]  [address]  Dear [name],    You are past due for follow up in our office.  Please schedule a visit soon to follow up diabetes, blood pressure, cholesterol and asthma.       Sincerely,   Ellery Plunk MD  Appended Document: Generic Letter mailed

## 2010-08-31 NOTE — Letter (Signed)
Summary: Generic Letter  [facility]  [address]   [place], [place] [address]   Phone: [phone]  Fax: [phone]    06/18/2008  Veronica Shaw [address] [place], [place]  [address]  Dear Veronica Shaw,  Your lab results from your last appointment on 06/17/08 were normal. This includes your liver tests, kidney test, electrolytes (potassium, sodium), thyroid test, and your blood counts. Please call our office if you have any questions.  Sincerely,   Cyndia Bent MD [facility]  Appended Document: Generic Letter sent  Appended Document: Generic Letter Needs sent to [facility] [id number] [place] [id number]  Appended Document: Generic Letter letter resent

## 2010-08-31 NOTE — Progress Notes (Signed)
Summary: meds prob  Phone Note Refill Request Call back at [phone] Message from:  Patient  needs Meloxicam for her sciatica - [facility] Constance Goltz gave her some last August   Initial call taken by: De Nurse,  August 07, 2009 9:34 AM  Follow-up for Phone Call        to pcp Follow-up by: Golden Circle RN,  August 07, 2009 9:36 AM  Additional Follow-up for Phone Call Additional follow up Details #1::        Review of notes indicates she had epistaxis on Meloxicam.  I called to discuss with her this morning and she states she was also taking a lot of ASA at that time for pain and attributes the epistaxis to her ASA use--I tend to agree.  She has been taking Meloxicam lately without problems.  I have removed Meloxicam from her allergy list--she is willing to take the chance of epistaxis, though it seems less likely now that she is not taking ASA.  The sciatica pain she is describing is actually the heel pain she has been evaluated for previously.  Rx sent to pharmacy. Additional Follow-up by: Romero Belling MD,  August 07, 2009 9:47 AM    New/Updated Medications: MELOXICAM 7.5 MG TABS (MELOXICAM) 1 tab by mouth daily for heel pain Prescriptions: MELOXICAM 7.5 MG TABS (MELOXICAM) 1 tab by mouth daily for heel pain  #30 x 2   Entered and Authorized by:   Romero Belling MD   Signed by:   Romero Belling MD on 08/07/2009   Method used:   Electronically to        [facility] (retail)       [address]       [place], [place]  [address]       Ph: [phone]       Fax: [phone]   RxID:   [id number]

## 2010-08-31 NOTE — Progress Notes (Signed)
Summary: refill  Phone Note Refill Request Call back at [phone] Message from:  Patient  Refills Requested: Medication #1:  ALBUTEROL SULFATE (2.5 MG/3ML) 0.083%  NEBU Use one ampule in nebulizer 4 times a day as needed Initial call taken by: De Nurse,  September 24, 2009 9:58 AM    Prescriptions: ALBUTEROL SULFATE (2.5 MG/3ML) 0.083%  NEBU (ALBUTEROL SULFATE) Use one ampule in nebulizer 4 times a day as needed  #100 x 1   Entered and Authorized by:   Romero Belling MD   Signed by:   Romero Belling MD on 09/24/2009   Method used:   Electronically to        [facility] (retail)       [address]       [place], [place]  [address]       Ph: [phone]       Fax: [phone]   RxID:   [id number]

## 2010-08-31 NOTE — Miscellaneous (Signed)
Summary: [facility] MEDS RECV'D  Clinical Lists Changes  RECV'D FROM [facility]:  [facility] -  1 BTL (#90), 50MG , LOT #[id number], EXP. 05/19/2009 PT NOTIFIED

## 2010-08-31 NOTE — Assessment & Plan Note (Signed)
Summary: meet new doc,df   Vital Signs:  Patient profile:   60 year old female Height:      62.5 inches Weight:      137.5 pounds BMI:     24.84 Temp:     98.8 degrees F oral Pulse rate:   108 / minute BP sitting:   157 / 78  (left arm) Cuff size:   regular  Vitals Entered By: Garen Grams LPN (June 14, 2010 1:43 PM) CC: DM, HTN, HL Is Patient Diabetic? No Pain Assessment Patient in pain? no        Primary Care Provider:  Ellery Plunk MD  CC:  DM, HTN, and HL.  History of Present Illness: achilles tendonitis- using orthotic, has seen SM no other therapies  HTN- cozaar waas too expensive, not taking for a few weeks.  BP usually 140/78.  HL- hasnt been checked since 2009. no meds.  diet- losing weight.  not changed diet.  walking a little.    menapause at 50, hot flashes much better.  DM- this am CBG 188, usually around 140.  Habits & Providers  Alcohol-Tobacco-Diet     Tobacco Status: never  Current Medications (verified): 1)  Bayer Childrens Aspirin 81 Mg Chew (Aspirin) .... Take 1 Tablet By Mouth Once A Day 2)  Metformin Hcl 500 Mg Tabs (Metformin Hcl) .... Take 1 Tablet By Mouth Twice A Day 3)  Albuterol Sulfate (2.5 Mg/30ml) 0.083%  Nebu (Albuterol Sulfate) .... Use One Ampule in Nebulizer 4 Times A Day As Needed 4)  Nitroquick 0.4 Mg  Subl (Nitroglycerin) .... Take One Tablet and Place Under Tongue If Develop Chest Pain 5)  Cozaar 100 Mg Tabs (Losartan Potassium) .... Take One Tablet Daily 6)  Loratadine 10 Mg Tabs (Loratadine) .[name] Kitchen.. 1 Tab By Mouth Daily For Allergies 7)  Nitroglycerin 0.2 Mg/hr Pt24 (Nitroglycerin) .... Use 1/4 Patch Daily As Directed 8)  Voltaren 1 % Gel (Diclofenac Sodium) .[name] Kitchen.. 1 Gram Into Affected Areas Qid 9)  Meloxicam 7.5 Mg Tabs (Meloxicam) .[name] Kitchen.. 1 Tab By Mouth Daily For Heel Pain 10)  Flovent Diskus 100 Mcg/blist Aepb (Fluticasone Propionate (Inhal)) .... Inhale Two Times A Day 11)  Ventolin Hfa 108 (90 Base) Mcg/act Aers  (Albuterol Sulfate) .... Use 2 Puffs Q 4hours As Needed Wheeze 12)  Hydrochlorothiazide 25 Mg Tabs (Hydrochlorothiazide) .... Take One Daily  Allergies (verified): 1)  ! Ace Inhibitors 2)  Penicillin  Review of Systems       The patient complains of weight loss.  The patient denies anorexia, fever, chest pain, and syncope.    Physical Exam  General:  vs reviewedwell-developed, well-nourished, and well-hydrated.   Head:  normocephalic and atraumatic.   Lungs:  Normal respiratory effort, chest expands symmetrically. Lungs are clear to auscultation, no crackles or wheezes. Heart:  Normal rate and regular rhythm. S1 and S2 normal without gallop, murmur, click, rub or other extra sounds. Abdomen:  Bowel sounds positive,abdomen soft and non-tender without masses, organomegaly or hernias noted. Msk:  No deformity or scoliosis noted of thoracic or lumbar spine.  normal strength of all 4 ext Extremities:  no edema noted BLE  Diabetes Management Exam:    Foot Exam (with socks and/or shoes not present):       Sensory-Monofilament:          Left foot: abnormal          Right foot: abnormal   Impression & Recommendations:  Problem # 1:  DIABETES MELLITUS II, UNCOMPLICATED (  ICD-250.00) Assessment Unchanged A1C under 7.  no change Her updated medication list for this problem includes:    Bayer Childrens Aspirin 81 Mg Chew (Aspirin) .[name] Kitchen... Take 1 tablet by mouth once a day    Metformin Hcl 500 Mg Tabs (Metformin hcl) .[name] Kitchen... Take 1 tablet by mouth twice a day    Cozaar 100 Mg Tabs (Losartan potassium) .[name] Kitchen... Take one tablet daily  Orders: A1C-[facility] (47425) [facility]- Est  Level 4 (99214)Future Orders: UA Microalbumin-[facility] (95638) ... 06/09/2011  Problem # 2:  HYPERTENSION, BENIGN SYSTEMIC (ICD-401.1) Assessment: Deteriorated not taking cozaar bc no insurance, not getting MAP anymore.  will look into getting back on MAP and try to get Cozaar.  FOr now HCTZ.  really needs ARB b/c cough with ACE,  proteinuria.  Her updated medication list for this problem includes:    Cozaar 100 Mg Tabs (Losartan potassium) .[name] Kitchen... Take one tablet daily    Hydrochlorothiazide 25 Mg Tabs (Hydrochlorothiazide) .[name] Kitchen... Take one daily  Orders: [facility]- Est  Level 4 (75643)  Problem # 3:  DYSLIPIDEMIA (ICD-272.4) Assessment: Unchanged will get fasting panel since has been 2 years.  pt is adhering betting to regimen.  continue meds Orders: [facility]- Est  Level 4 (99214)Future Orders: Comp Met-[facility] (32951-88416) ... 06/14/2011 Lipid-[facility] (60630-16010) ... 06/10/2011  Complete Medication List: 1)  Bayer Childrens Aspirin 81 Mg Chew (Aspirin) .... Take 1 tablet by mouth once a day 2)  Metformin Hcl 500 Mg Tabs (Metformin hcl) .... Take 1 tablet by mouth twice a day 3)  Albuterol Sulfate (2.5 Mg/66ml) 0.083% Nebu (Albuterol sulfate) .... Use one ampule in nebulizer 4 times a day as needed 4)  Nitroquick 0.4 Mg Subl (Nitroglycerin) .... Take one tablet and place under tongue if develop chest pain 5)  Cozaar 100 Mg Tabs (Losartan potassium) .... Take one tablet daily 6)  Loratadine 10 Mg Tabs (Loratadine) .[name] Kitchen.. 1 tab by mouth daily for allergies 7)  Nitroglycerin 0.2 Mg/hr Pt24 (Nitroglycerin) .... Use 1/4 patch daily as directed 8)  Voltaren 1 % Gel (Diclofenac sodium) .[name] Kitchen.. 1 gram into affected areas qid 9)  Meloxicam 7.5 Mg Tabs (Meloxicam) .[name] Kitchen.. 1 tab by mouth daily for heel pain 10)  Flovent Diskus 100 Mcg/blist Aepb (Fluticasone propionate (inhal)) .... Inhale two times a day 11)  Ventolin Hfa 108 (90 Base) Mcg/act Aers (Albuterol sulfate) .... Use 2 puffs q 4hours as needed wheeze 12)  Hydrochlorothiazide 25 Mg Tabs (Hydrochlorothiazide) .... Take one daily  Other Orders: Flu Vaccine 59yrs + (93235) Admin 1st Vaccine (57322)  Patient Instructions: 1)  it was nice to meet you today 2)  please go to your pharmacy to pick up the HCTZ for blood pressure and your refills. 3)  please call if you get MAP set up so we  can switch you back to Cozaar 4)  please make a morning lab appt and come without eating Prescriptions: HYDROCHLOROTHIAZIDE 25 MG TABS (HYDROCHLOROTHIAZIDE) take one daily  #30 x 6   Entered and Authorized by:   Ellery Plunk MD   Signed by:   Ellery Plunk MD on 06/14/2010   Method used:   Electronically to        [facility] (retail)       [address]       [place], [place]  [address]       Ph: [phone]       Fax: [phone]   RxID:   [id number]  NITROQUICK 0.4 MG  SUBL (NITROGLYCERIN) Take one tablet and place under tongue if develop chest pain  #10 x 0   Entered and Authorized by:   Ellery Plunk MD   Signed by:   Ellery Plunk MD on 06/14/2010   Method used:   Electronically to        [facility] (retail)       [address]       [place], [place]  [address]       Ph: [phone]       Fax: [phone]   RxID:   [id number] ALBUTEROL SULFATE (2.5 MG/3ML) 0.083%  NEBU (ALBUTEROL SULFATE) Use one ampule in nebulizer 4 times a day as needed  #100 x 1   Entered and Authorized by:   Ellery Plunk MD   Signed by:   Ellery Plunk MD on 06/14/2010   Method used:   Electronically to        [facility] (retail)       [address]       [place], [place]  [address]       Ph: [phone]       Fax: [phone]   RxID:   [id number] METFORMIN HCL 500 MG TABS (METFORMIN HCL) Take 1 tablet by mouth twice a day  #60 x 6   Entered and Authorized by:   Ellery Plunk MD   Signed by:   Ellery Plunk MD on 06/14/2010   Method used:   Electronically to        [facility] (retail)       [address]       [place], [place]  [address]       Ph: [phone]       Fax: [phone]   RxID:   [id number] VENTOLIN HFA 108 (90 BASE) MCG/ACT AERS (ALBUTEROL SULFATE) use 2 puffs q 4hours as needed wheeze  #1 x 1   Entered and Authorized by:    Ellery Plunk MD   Signed by:   Ellery Plunk MD on 06/14/2010   Method used:   Electronically to        [facility] (retail)       [address]       [place], [place]  [address]       Ph: [phone]       Fax: [phone]   RxID:   [id number] METROGEL 1 % GEL (METRONIDAZOLE) apply to lesions on face once daily  #1 x 3   Entered and Authorized by:   Ellery Plunk MD   Signed by:   Terese Door on 06/14/2010   Method used:   Electronically to        [facility] (retail)       [address]       [place], [place]  [address]       Ph: [phone]       Fax: [phone]   RxID:   [id number]    Orders Added: 1)  A1C-[facility] [83036] 2)  Flu Vaccine 29yrs + [90658] 3)  Admin 1st Vaccine [90471] 4)  [facility]- Est  Level 4 [19509] 5)  Comp Met-[facility] [32671-24580] 6)  Lipid-[facility] [99833-82505]  7)  UA Microalbumin-[facility] [82044]   Immunizations Administered:  Influenza Vaccine # 1:    Vaccine Type: Fluvax 3+    Site: left deltoid    Mfr: GlaxoSmithKline    Dose: 0.5 ml    Route: IM    Given by: Garen Grams LPN    Exp. Date: 01/26/2011    Lot #: [id number]    VIS given: 02/23/10 version given June 14, 2010.  Flu Vaccine Consent Questions:    Do you have a history of severe allergic reactions to this vaccine? no    Any prior history of allergic reactions to egg and/or gelatin? no    Do you have a sensitivity to the preservative Thimersol? no    Do you have a past history of Guillan-Barre Syndrome? no    Do you currently have an acute febrile illness? no    Have you ever had a severe reaction to latex? no    Vaccine information given and explained to patient? yes    Are you currently pregnant? no   Immunizations Administered:  Influenza Vaccine # 1:    Vaccine Type: Fluvax 3+    Site: left deltoid    Mfr: GlaxoSmithKline    Dose: 0.5 ml    Route: IM    Given by: Garen Grams LPN    Exp. Date:  01/26/2011    Lot #: [id number]    VIS given: 02/23/10 version given June 14, 2010.  Laboratory Results   Blood Tests   Date/Time Received: June 14, 2010 1:35 PM  Date/Time Reported: June 14, 2010 2:16 PM   HGBA1C: 6.3%   (Normal Range: Non-Diabetic - 3-6%   Control Diabetic - 6-8%)  Comments: ...........test performed by...........[name] KitchenGaren Grams, LPN entered by Terese Door, CMA           Diabetic Foot Exam Foot Inspection Is there a history of a foot ulcer?              No Is there a foot ulcer now?              No Can the patient see the bottom of their feet?          Yes Are the shoes appropriate in style and fit?          Yes Is there swelling or an abnormal foot shape?          No Are the toenails long?                No Are the toenails thick?                No Are the toenails ingrown?              No Is there heavy callous build-up?              No Is there pain in the calf muscle (Intermittent claudication) when walking?    NoIs there a claw toe deformity?              No Is there elevated skin temperature?            No Is there limited ankle dorsiflexion?            No Is there foot or ankle muscle weakness?            No  Diabetic Foot Care Education Patient educated on appropriate care of diabetic feet.   High Risk Feet? Yes   10-g (  5.07) Semmes-Weinstein Monofilament Test           Right Foot          Left Foot Visual Inspection     normal           normal Test Control      normal         normal Site 1         abnormal         abnormal Site 2         abnormal         abnormal Site 3         abnormal         abnormal Site 4         abnormal         abnormal Site 5         abnormal         abnormal Site 6         abnormal         abnormal Site 7         abnormal         abnormal Site 8         abnormal         abnormal Site 9         abnormal         abnormal Site 10         abnormal         abnormal  Impression      abnormal          abnormal   Prevention & Chronic Care Immunizations   Influenza vaccine: Fluvax 3+  (06/14/2010)   Influenza vaccine due: 05/16/2008    Tetanus booster: 03/01/2006: Done.   Tetanus booster due: 03/01/2016    Pneumococcal vaccine: Pneumovax (State)  (05/17/2007)   Pneumococcal vaccine due: None    H. zoster vaccine: Not documented  Colorectal Screening   Hemoccult: normal  (06/19/2008)   Hemoccult due: 06/19/2009    Colonoscopy: Done.  (08/02/1999)   Colonoscopy action/deferral: Deferred  (06/14/2010)   Colonoscopy due: 08/01/2009  Other Screening   Pap smear: Done.  (06/06/2005)   Pap smear due: 06/15/2011    Mammogram: Done.  (08/01/2006)   Mammogram action/deferral: Ordered  (06/14/2010)   Mammogram due: 08/02/2007    DXA bone density scan: Done.  (10/30/2004)   DXA scan due: None     Smoking status: never  (06/14/2010)  Diabetes Mellitus   HgbA1C: 6.3  (06/14/2010)   Hemoglobin A1C due: 01/03/2008    Eye exam: Not documented    Foot exam: yes  (06/14/2010)   Foot exam action/deferral: Do today   High risk foot: Yes  (06/14/2010)   Foot care education: Done  (06/14/2010)    Urine microalbumin/creatinine ratio: Not documented   Urine microalbumin/cr due: 05/16/2008  Hypertension   Last Blood Pressure: 157 / 78  (06/14/2010)   Serum creatinine: 0.70  (06/17/2008)   Serum potassium 4.2  (06/17/2008)   Basic metabolic panel due: 07/14/2010    Hypertension flowsheet reviewed?: Yes   Progress toward BP goal: Deteriorated  Lipids   Total Cholesterol: 146  (11/07/2007)   Lipid panel action/deferral: Lipid Panel ordered   LDL: 81  (11/07/2007)   LDL Direct: 111  (01/24/2007)   HDL: 45  (11/07/2007)   Triglycerides: 101  (11/07/2007)    SGOT (AST): 25  (06/17/2008)   BMP action: Ordered  SGPT (ALT): 24  (06/17/2008)   Alkaline phosphatase: 114  (06/17/2008)   Total bilirubin: 0.4  (06/17/2008)    Lipid flowsheet reviewed?: Yes   Progress toward LDL  goal: Unchanged  Self-Management Support :   Personal Goals (by the next clinic visit) :     Personal A1C goal: 7  (06/14/2010)     Personal blood pressure goal: 140/90  (06/14/2010)     Personal LDL goal: 70  (06/14/2010)    Patient will work on the following items until the next clinic visit to reach self-care goals:     Medications and monitoring: take my medicines every day, check my blood sugar, check my blood pressure, bring all of my medications to every visit, examine my feet every day  (06/14/2010)     Activity: take a 30 minute walk every day  (06/14/2010)    Diabetes self-management support: Written self-care plan  (06/14/2010)   Diabetes care plan printed    Hypertension self-management support: Written self-care plan  (06/14/2010)   Hypertension self-care plan printed.    Lipid self-management support: Written self-care plan  (06/14/2010)   Lipid self-care plan printed.   Nursing Instructions: Diabetic foot exam today

## 2010-08-31 NOTE — Miscellaneous (Signed)
  Clinical Lists Changes  Medications: Removed medication of ENALAPRIL MALEATE 5 MG  TABS (ENALAPRIL MALEATE) Take one tablet by mouth daily Added new medication of COZAAR 50 MG  TABS (LOSARTAN POTASSIUM) Take one tablet daily - Signed Rx of COZAAR 50 MG  TABS (LOSARTAN POTASSIUM) Take one tablet daily;  #90 x 3;  Signed;  Entered by: Alanda Amass MD;  Authorized by: Alanda Amass MD;  Method used: Handwritten Allergies: Added new allergy or adverse reaction of ACE INHIBITORS    Prescriptions: COZAAR 50 MG  TABS (LOSARTAN POTASSIUM) Take one tablet daily  #90 x 3   Entered and Authorized by:   Alanda Amass MD   Signed by:   Alanda Amass MD on 06/18/2007   Method used:   Handwritten   RxID:   [id number]

## 2010-08-31 NOTE — Progress Notes (Signed)
  Phone Note Call from Patient   Caller: Patient Call For: [phone] Summary of Call: Pt cannot afford Cozaar meds.  Would [facility] comparabe [facility].  Please advise patient when rx has been sent Initial call taken by: Abundio Miu,  June 29, 2010 3:08 PM  Follow-up for Phone Call        Left message on vm informing pt that new rx was sent to pharmacy. Follow-up by: Garen Grams LPN,  June 30, 2010 11:25 AM    New/Updated Medications: DIOVAN 160 MG TABS (VALSARTAN) take one daily Prescriptions: DIOVAN 160 MG TABS (VALSARTAN) take one daily  #90 x 3   Entered and Authorized by:   Ellery Plunk MD   Signed by:   Ellery Plunk MD on 06/29/2010   Method used:   Faxed to ...       [facility] (retail)       [address] [place], [place]  [address]       Ph: [phone]       Fax: [phone]   RxID:   [id number]

## 2010-08-31 NOTE — Letter (Signed)
Summary: Past Due for follow up  [facility]  [address]   [place], [place] [address]   Phone: [phone]  Fax: [phone]    01/21/2010  Veronica Shaw [address] [place], [place]  [address]  Dear [name],  You are past due for follow up in our office.  Please schedule a visit for early July to follow up diabetes, blood pressure, cholesterol and asthma.  Regards, Madlyn Frankel. Constance Goltz, MD  Appended Document: Past Due for follow up MAILED.

## 2010-08-31 NOTE — Progress Notes (Signed)
Summary: MEDS PROB  Phone Note Call from Patient Call back at [phone]   Caller: Patient Summary of Call: Flovent cost $100 and cannot afford this - pls call in something cheaper - pt is there now [facility] [place] Initial call taken by: De Nurse,  September 29, 2009 12:02 PM  Follow-up for Phone Call        Discussed with [facility] pharmacist--no cheaper cash alternatives.  Have sent Flovent Rx to [facility] and informed patient.  Have also sent 5 day oral prednisone burst to [facility] as it seems she may be delayed in getting inhaled steroid. Follow-up by: Romero Belling MD,  September 29, 2009 12:18 PM    New/Updated Medications: PREDNISONE 50 MG TABS (PREDNISONE) 1 tab by mouth daily x5 days Prescriptions: PREDNISONE 50 MG TABS (PREDNISONE) 1 tab by mouth daily x5 days  #5 x 0   Entered by:   Romero Belling MD   Authorized by:   Marisue Ivan  MD   Signed by:   Romero Belling MD on 09/29/2009   Method used:   Electronically to        [facility] (retail)       [address]       [place], [place]  [address]       Ph: [phone]       Fax: [phone]   RxID:   [id number] FLOVENT DISKUS 100 MCG/BLIST AEPB (FLUTICASONE PROPIONATE (INHAL)) inhale two times a day  #1 x 1   Entered by:   Romero Belling MD   Authorized by:   Marisue Ivan  MD   Signed by:   Romero Belling MD on 09/29/2009   Method used:   Faxed to ...       [facility] Department (retail)       [address] [place], [place]  [address]       Ph: [phone]       Fax: [phone]   RxID:   [id number]

## 2010-08-31 NOTE — Progress Notes (Signed)
Summary: triage  Phone Note Call from Patient Call back at [phone]   Caller: Patient Complaint: Nausea/Vomiting/Diarrhea Summary of Call: had since Friday Initial call taken by: De Nurse,  October 08, 2008 11:20 AM  Follow-up for Phone Call        vomiting & diarrhea is better than last week, but still happening. taking immodium & pepto not helping. unable to eat anything. is drinking well. am cbg was 124. states she also has vertigo. appt at 8:30am tomorrow at her request. told her we have a doctor on call if problems when we are closed Follow-up by: Golden Circle RN,  October 08, 2008 11:45 AM

## 2010-08-31 NOTE — Progress Notes (Signed)
Summary: Rx Req  Phone Note Refill Request Call back at Home Phone [phone] Message from:  Patient  Refills Requested: Medication #1:  METFORMIN HCL 500 MG TABS Take 1 tablet by mouth twice a day [facility] [place].  CAN SHE GET ENOUGH TILL SHE COMES IN ON THE 21ST OF THIS MONTH.  Initial call taken by: Clydell Hakim,  May 12, 2010 10:21 AM  Follow-up for Phone Call        yes.  will send to pharm Follow-up by: Ellery Plunk MD,  May 12, 2010 10:25 AM    Prescriptions: METFORMIN HCL 500 MG TABS (METFORMIN HCL) Take 1 tablet by mouth twice a day  #60 x 1   Entered and Authorized by:   Ellery Plunk MD   Signed by:   Ellery Plunk MD on 05/12/2010   Method used:   Electronically to        [facility] (retail)       [address]       [place], [place]  [address]       Ph: [phone]       Fax: [phone]   RxID:   [id number]

## 2010-09-02 NOTE — Progress Notes (Signed)
  Phone Note Call from Patient   Caller: Patient Call For: [phone] Summary of Call: Mrs. Lorenda Hatchet wanted to get [facility] of [facility] cause she had problems before with her liver enzymes being elevated from the medicine.  Pharmacy will fill rx for another bp med if office calls it in. Initial call taken by: Abundio Miu,  June 15, 2010 2:45 PM  Follow-up for Phone Call        pt should be on cozaar if she can get it [facility]  will fax that it. Follow-up by: Ellery Plunk MD,  June 15, 2010 3:01 PM    Prescriptions: COZAAR 100 MG TABS (LOSARTAN POTASSIUM) Take one tablet daily  #30 x 3   Entered and Authorized by:   Ellery Plunk MD   Signed by:   Ellery Plunk MD on 06/15/2010   Method used:   Faxed to ...       [facility] Department (retail)       [address] [place], [place]  [address]       Ph: [phone]       Fax: [phone]   RxID:   [id number] COZAAR 100 MG TABS (LOSARTAN POTASSIUM) Take one tablet daily  #30 x 3   Entered and Authorized by:   Ellery Plunk MD   Signed by:   Ellery Plunk MD on 06/15/2010   Method used:   Electronically to        [facility] (retail)       [address]       [place], [place]  [address]       Ph: [phone]       Fax: [phone]   RxID:   [id number]

## 2010-09-02 NOTE — Progress Notes (Signed)
Summary: results  Phone Note Call from Patient Call back at [phone]   Caller: Patient Summary of Call: is asking for results for labs and also wants a copy of it Initial call taken by: De Nurse,  July 05, 2010 1:40 PM  Follow-up for Phone Call        Tried calling pt, no answer, no vm. Copy of labs mailed to pt. Follow-up by: Garen Grams LPN,  July 07, 2010 9:37 AM     Appended Document: results patient calls wanting to pick up copy of lab report. copy placed in file in front office to pick up tomorrow.  Appended Document: results mailed returned - called to get correct address but have not heard back from pt - will mail when she calls back.

## 2010-09-02 NOTE — Miscellaneous (Signed)
Clinical Lists Changes  Medications: Changed medication from LORATADINE 10 MG TABS (LORATADINE) 1 tab by mouth daily for allergies to CLARINEX 5 MG TABS (DESLORATADINE) take one daily - Signed Added new medication of PROTONIX 40 MG TBEC (PANTOPRAZOLE SODIUM) take one by mouth daily - Signed Changed medication from VENTOLIN HFA 108 (90 BASE) MCG/ACT AERS (ALBUTEROL SULFATE) use 2 puffs q 4hours as needed wheeze to VENTOLIN HFA 108 (90 BASE) MCG/ACT AERS (ALBUTEROL SULFATE) use 2 puffs q 4hours as needed wheeze - Signed Changed medication from NITROQUICK 0.4 MG  SUBL (NITROGLYCERIN) Take one tablet and place under tongue if develop chest pain to NITROQUICK 0.4 MG  SUBL (NITROGLYCERIN) Take one tablet and place under tongue if develop chest pain may repeat x2 - Signed Removed medication of NITROGLYCERIN 0.2 MG/HR PT24 (NITROGLYCERIN) use 1/4 patch daily as directed Removed medication of VOLTAREN 1 % GEL (DICLOFENAC SODIUM) 1 gram into affected areas QID Rx of CLARINEX 5 MG TABS (DESLORATADINE) take one daily;  #30 x 6;  Signed;  Entered by: Ellery Plunk MD;  Authorized by: Ellery Plunk MD;  Method used: Faxed to [facility], [address], [place], [place]  [address], Ph: [phone], Fax: [phone] Rx of PROTONIX 40 MG TBEC (PANTOPRAZOLE SODIUM) take one by mouth daily;  #30 x 6;  Signed;  Entered by: Ellery Plunk MD;  Authorized by: Ellery Plunk MD;  Method used: Faxed to [facility], [address], [place], [place]  [address], Ph: [phone], Fax: [phone] Rx of VENTOLIN HFA 108 (90 BASE) MCG/ACT AERS (ALBUTEROL SULFATE) use 2 puffs q 4hours as needed wheeze;  #1 x 1;  Signed;  Entered by: Ellery Plunk MD;  Authorized by: Ellery Plunk MD;  Method used: Faxed to [facility], [address], [place], [place]  [address], Ph: [phone], Fax: [phone] Rx of NITROQUICK 0.4  MG  SUBL (NITROGLYCERIN) Take one tablet and place under tongue if develop chest pain may repeat x2;  #20 x 2;  Signed;  Entered by: Ellery Plunk MD;  Authorized by: Ellery Plunk MD;  Method used: Faxed to [facility], [address], [place], [place]  [address], Ph: [phone], Fax: [phone]    Prescriptions: NITROQUICK 0.4 MG  SUBL (NITROGLYCERIN) Take one tablet and place under tongue if develop chest pain may repeat x2  #20 x 2   Entered and Authorized by:   Ellery Plunk MD   Signed by:   Ellery Plunk MD on 08/04/2010   Method used:   Faxed to ...       [facility] (retail)       [address]       [place], [place]  [address]       Ph: [phone]       Fax: [phone]   RxID:   [id number] VENTOLIN HFA 108 (90 BASE) MCG/ACT AERS (ALBUTEROL SULFATE) use 2 puffs q 4hours as needed wheeze  #1 x 1   Entered and Authorized by:   Ellery Plunk MD   Signed by:   Ellery Plunk MD on 08/04/2010   Method used:   Faxed to ...       [facility] (retail)       [address]       [place], [place]  [address]       Ph: [phone]  Fax: [phone]   RxID:   [id number] PROTONIX 40 MG TBEC (PANTOPRAZOLE SODIUM) take one by mouth daily  #30 x 6   Entered and Authorized by:   Ellery Plunk MD   Signed by:   Ellery Plunk MD on 08/04/2010   Method used:   Faxed to ...       [facility] (retail)       [address]       [place], [place]  [address]       Ph: [phone]       Fax: [phone]   RxID:   [id number] CLARINEX 5 MG TABS (DESLORATADINE) take one daily  #30 x 6   Entered and Authorized by:   Ellery Plunk MD   Signed by:   Ellery Plunk MD on 08/04/2010   Method used:   Faxed to ...       [facility] (retail)       [address]       [place],  [place]  [address]       Ph: [phone]       Fax: [phone]   RxID:   [id number]

## 2010-09-27 ENCOUNTER — Inpatient Hospital Stay (INDEPENDENT_AMBULATORY_CARE_PROVIDER_SITE_OTHER)
Admission: RE | Admit: 2010-09-27 | Discharge: 2010-09-27 | Disposition: A | Discharge status: Home / Self Care | Payer: Self-pay | Source: Ambulatory Visit | Attending: Family Medicine | Admitting: Family Medicine

## 2010-09-27 DIAGNOSIS — N390 Urinary tract infection, site not specified: Secondary | ICD-10-CM

## 2010-09-27 DIAGNOSIS — N950 Postmenopausal bleeding: Secondary | ICD-10-CM

## 2010-09-27 LAB — POCT URINALYSIS DIPSTICK
Bilirubin Urine: NEGATIVE
Ketones, ur: NEGATIVE mg/dL
Nitrite: NEGATIVE
Protein, ur: NEGATIVE mg/dL
Specific Gravity, Urine: <=1.005 (ref 1.005–1.030)
Urine Glucose, Fasting: NEGATIVE mg/dL
Urobilinogen, UA: 0.2 mg/dL (ref 0.0–1.0)
pH: 6.5 (ref 5.0–8.0)

## 2010-10-04 ENCOUNTER — Ambulatory Visit (INDEPENDENT_AMBULATORY_CARE_PROVIDER_SITE_OTHER): Payer: Self-pay | Admitting: Family Medicine

## 2010-10-04 ENCOUNTER — Encounter: Payer: Self-pay | Admitting: Family Medicine

## 2010-10-04 DIAGNOSIS — M7660 Achilles tendinitis, unspecified leg: Secondary | ICD-10-CM

## 2010-10-07 ENCOUNTER — Other Ambulatory Visit: Payer: Self-pay | Admitting: *Deleted

## 2010-10-07 NOTE — Telephone Encounter (Signed)
Received note form [facility] Dept that patient had refill on Diovan and ventolin 10/01/2010 and she left meds in their bathroom and they were never found or returned. Want to know if Diovan can be filled again early. Also needs new Rx for ventolin. Advised both meds  can be refilled now and gave new RX for ventolin.

## 2010-10-08 MED ORDER — ALBUTEROL SULFATE HFA 108 (90 BASE) MCG/ACT IN AERS: 2.0000 | INHALATION_SPRAY | RESPIRATORY_TRACT | Status: DC | PRN

## 2010-10-12 NOTE — Assessment & Plan Note (Signed)
Summary: F/U HEEL PAIN,[facility]   Primary Care Provider:  [name]   History of Present Illness: right achilles pain has returned. Got much better for a while with the patches. has started new job and standing a lot--having probs again. no new injury pain at insertion achilles. no foot weakness. no numbness  Allergies: 1)  ! Ace Inhibitors 2)  Penicillin  Review of Systems       Please see HPI for additional ROS.   Physical Exam  General:  alert, well-developed, well-nourished, and well-hydrated.   Msk:  right achilles TTP at insertion. small amount ST swelling /induration of chronic inflammation but no erythema or warmth Additional Exam:  [place] right achilles shows no defect in achilles tendon. the insertion reveals several areas of calcification and some mild edema. no fluid seen in retrocalcaneal bursa   Impression & Recommendations:  Problem # 1:  ACHILLES BURSITIS OR TENDINITIS (ICD-726.71) restart nitro patches rtc 2-4 weeks and consider adding eccentric exercises then-_I think she is too sore to add now  Complete Medication List: 1)  Bayer Childrens Aspirin 81 Mg Chew (Aspirin) .... Take 1 tablet by mouth once a day 2)  Metformin Hcl 500 Mg Tabs (Metformin hcl) .... Take 1 tablet by mouth twice a day 3)  Albuterol Sulfate (2.5 Mg/35ml) 0.083% Nebu (Albuterol sulfate) .... Use one ampule in nebulizer 4 times a day as needed 4)  Nitroquick 0.4 Mg Subl (Nitroglycerin) .... Take one tablet and place under tongue if develop chest pain may repeat x2 5)  Diovan 160 Mg Tabs (Valsartan) .... Take one daily 6)  Clarinex 5 Mg Tabs (Desloratadine) .... Take one daily 7)  Meloxicam 7.5 Mg Tabs (Meloxicam) .[name] Kitchen.. 1 tab by mouth daily for heel pain 8)  Flovent Diskus 100 Mcg/blist Aepb (Fluticasone propionate (inhal)) .... Inhale two times a day 9)  Ventolin Hfa 108 (90 Base) Mcg/act Aers (Albuterol sulfate) .... Use 2 puffs q 4hours as needed wheeze 10)  Protonix 40 Mg Tbec  (Pantoprazole sodium) .... Take one by mouth daily 11)  Nitro-dur 0.2 Mg/hr Pt24 (Nitroglycerin) .... Generic please use 1/4 patch once daily applied to achilles tendon insertion of right ankle as directed Prescriptions: NITRO-DUR 0.2 MG/HR PT24 (NITROGLYCERIN) generic please use 1/4 patch once daily applied to achilles tendon insertion of right ankle as directed  #8 x 3   Entered and Authorized by:   Veronica Shaw   Signed by:   Veronica Shaw on 10/05/2010   Method used:   Electronically to        [facility] (retail)       [address]       [place], [place]  [address]       Ph: [phone]       Fax: [phone]   RxID:   [id number]    Orders Added: 1)  Est. Patient Level III [84132]

## 2010-10-18 ENCOUNTER — Encounter: Payer: Self-pay | Admitting: Family Medicine

## 2010-10-18 ENCOUNTER — Ambulatory Visit (INDEPENDENT_AMBULATORY_CARE_PROVIDER_SITE_OTHER): Payer: Self-pay | Admitting: Family Medicine

## 2010-10-18 VITALS — BP 134/75 | HR 98 | Temp 98.6°F | Wt 138.8 lb

## 2010-10-18 DIAGNOSIS — N938 Other specified abnormal uterine and vaginal bleeding: Secondary | ICD-10-CM

## 2010-10-18 DIAGNOSIS — N925 Other specified irregular menstruation: Secondary | ICD-10-CM

## 2010-10-18 DIAGNOSIS — N949 Unspecified condition associated with female genital organs and menstrual cycle: Secondary | ICD-10-CM

## 2010-10-18 NOTE — Patient Instructions (Signed)
We are going to send you for an ultrasound [facility] They will be able to evaluate your bleeding and see what may be causing it Please call if you have any questions

## 2010-10-18 NOTE — Assessment & Plan Note (Signed)
3 episodes of vaginal bleeding in the last year.  menopause was 10 years ago.  Has abnormal paps but states that colpo is always normal.  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  WIll send for U/s to evaluate endometrial stripe and to [facility] for endometrial biopsy

## 2010-10-18 NOTE — Progress Notes (Signed)
F/u from UC visit for UTI and bleeding.  Pt states that she was passing clots.  She has had episodes of postmenopausal bleeding 2-3 times before and she thinks these are associated with UTIs.  She states that she has been told in the past that she has a stenotic cervix.  She usually has her Paps at [facility] and would like to [facility] back [facility].  SHe denies any pain, cramping, d/c.  No new sexual partners.    PE Vital signs reviewed General appearance - alert, well appearing, and in no distress and oriented to person, place, and time Heart - normal rate, regular rhythm, normal S1, S2, no murmurs, rubs, clicks or gallops Chest - clear to auscultation, no wheezes, rales or rhonchi, symmetric air entry, no tachypnea, retractions or cyanosis

## 2010-10-22 LAB — POCT URINALYSIS DIP (DEVICE)
Glucose, UA: NEGATIVE mg/dL
Nitrite: POSITIVE — AB
Protein, ur: >=300 mg/dL — AB
Specific Gravity, Urine: 1.01 (ref 1.005–1.030)
Urobilinogen, UA: 0.2 mg/dL (ref 0.0–1.0)
pH: 6 (ref 5.0–8.0)

## 2010-10-25 ENCOUNTER — Ambulatory Visit (HOSPITAL_COMMUNITY)
Admission: RE | Admit: 2010-10-25 | Discharge: 2010-10-25 | Disposition: A | Discharge status: Home / Self Care | Payer: Self-pay | Source: Ambulatory Visit | Attending: Family Medicine | Admitting: Family Medicine

## 2010-10-25 ENCOUNTER — Other Ambulatory Visit: Payer: Self-pay | Admitting: Family Medicine

## 2010-10-25 DIAGNOSIS — N950 Postmenopausal bleeding: Secondary | ICD-10-CM | POA: Insufficient documentation

## 2010-10-25 DIAGNOSIS — D251 Intramural leiomyoma of uterus: Secondary | ICD-10-CM | POA: Insufficient documentation

## 2010-10-25 DIAGNOSIS — N938 Other specified abnormal uterine and vaginal bleeding: Secondary | ICD-10-CM

## 2010-10-27 ENCOUNTER — Telehealth: Payer: Self-pay | Admitting: Family Medicine

## 2010-10-27 NOTE — Telephone Encounter (Signed)
Called and left VM.  Told pt that u/s was benign with small fibroid that doesn't seem to be the cause of her bleeding.  She should still follow up with the [facility] for evaluation

## 2010-10-27 NOTE — Telephone Encounter (Signed)
Please call asap for results from Monday's ultrasound

## 2010-11-24 ENCOUNTER — Encounter: Payer: Self-pay | Admitting: Obstetrics and Gynecology

## 2010-11-26 ENCOUNTER — Encounter: Payer: Self-pay | Admitting: Family Medicine

## 2010-11-26 ENCOUNTER — Ambulatory Visit (INDEPENDENT_AMBULATORY_CARE_PROVIDER_SITE_OTHER): Payer: Self-pay | Admitting: Family Medicine

## 2010-11-26 VITALS — BP 130/67 | HR 86 | Temp 98.7°F | Ht 62.5 in | Wt 139.0 lb

## 2010-11-26 DIAGNOSIS — K921 Melena: Secondary | ICD-10-CM

## 2010-11-26 DIAGNOSIS — N949 Unspecified condition associated with female genital organs and menstrual cycle: Secondary | ICD-10-CM

## 2010-11-26 DIAGNOSIS — N925 Other specified irregular menstruation: Secondary | ICD-10-CM

## 2010-11-26 DIAGNOSIS — N938 Other specified abnormal uterine and vaginal bleeding: Secondary | ICD-10-CM

## 2010-11-26 DIAGNOSIS — K2970 Gastritis, unspecified, without bleeding: Secondary | ICD-10-CM | POA: Insufficient documentation

## 2010-11-26 DIAGNOSIS — R300 Dysuria: Secondary | ICD-10-CM

## 2010-11-26 LAB — POCT URINALYSIS DIPSTICK
Bilirubin, UA: NEGATIVE
Blood, UA: NEGATIVE
Glucose, UA: NEGATIVE
Ketones, UA: NEGATIVE
Leukocytes, UA: NEGATIVE
Nitrite, UA: NEGATIVE
Protein, UA: NEGATIVE
Spec Grav, UA: 1.02
Urobilinogen, UA: 0.2
pH, UA: 7.5

## 2010-11-26 NOTE — Patient Instructions (Signed)
Please stop taking Meloxicam. You may take Tylenol 650mg  every 6 hours as needed for your foot pain. Please also do not take ibuprofen or any other medications in the NSAID class.  If your CBC results are abnormal, I will call you. If they are normal, I will send you a letter.  Please make an appointment to see Dr. Hulen Luster in 1-2 weeks.

## 2010-11-27 LAB — CBC
HCT: 42.2 % (ref 36.0–46.0)
Hemoglobin: 13.5 g/dL (ref 12.0–15.0)
MCH: 29.3 pg (ref 26.0–34.0)
MCHC: 32 g/dL (ref 30.0–36.0)
MCV: 91.5 fL (ref 78.0–100.0)
Platelets: 191 10*3/uL (ref 150–400)
RBC: 4.61 MIL/uL (ref 3.87–5.11)
RDW: 12.4 % (ref 11.5–15.5)
WBC: 6 10*3/uL (ref 4.0–10.5)

## 2010-11-28 ENCOUNTER — Encounter: Payer: Self-pay | Admitting: Family Medicine

## 2010-11-28 NOTE — Progress Notes (Signed)
  Subjective:    Patient ID: TYLEAH LOH, female    DOB: 1950-12-07, 60 y.o.   MRN: [id number]  HPI For the past 3 weeks, patient has been having vaginal bleeding. She has recently been seen by her PCP Dr. Hulen Luster for this problem who sent her for a pelvic ultrasound, which was done on 10/25/2010 and was normal except for a uterine polyp that was unlikely causing her bleeding.   Today, she also complains of having black tarry stools and a burning sensation in her stomach. She had a colonoscopy less than 10 years ago that per patient did not show any abnormalities.  She has been taking ibuprofen at least 4 times a day recently due to her right foot pain (diagnosed with Achilles bursitis/tendinitis). She also takes Mobic but infrequently (a few times a month).   Per patient, she was also diagnosed with esophageal erosion about 10 years ago. She was put on a PPI (Nexium) but could not afford it and is no on Protonix. She says that she never fully recovered from the esophageal erosion and that it still irritates her.   Review of Systems Spits up a lot but no vomiting; no blood in spit; tolerates her diet; no difficulty swallowing; no constipation, diarrhea     Objective:   Physical Exam  Constitutional: No distress.  Abdominal: Soft. Bowel sounds are normal. She exhibits no distension. There is no tenderness. There is no guarding.  Genitourinary: Vagina normal. There is no rash, tenderness, lesion or injury on the right labia. There is no rash, tenderness, lesion or injury on the left labia. No tenderness or bleeding around the vagina. No vaginal discharge found.       Anus: external hemorrhoids, non-tender; no bleeding Hemoccult negative   Skin: She is not diaphoretic.  Psychiatric:       Appears anxious  Was not against but was initially resistant to a GU exam           Assessment & Plan:

## 2010-11-28 NOTE — Assessment & Plan Note (Signed)
Patient likely has gastritis from all the Motrin she recently started taking for her right foot pain.  CBC today shows she is not anemic. Although she was complaining of black tarry stools, her hemoccult today was negative. Patient was given hemoccult cards to take home and asked to check for blood in her stool.  Patient was asked to take Tylenol instead of Motrin for her foot pain. She was also asked to stop taking the Mobic.  Patient was asked to follow-up with her PCP if her symptoms persist or if she is hemoccult positive.  If she does have blood in stool based on these cards, consider referring patient to GI for EGD/colonoscopy.

## 2010-11-28 NOTE — Assessment & Plan Note (Signed)
Patient is not sure if she is still having vaginal bleeding or if the blood is just from her stool. U/A today did not show blood. Went over patient's pelvic U/S with her again; she is aware that it was normal with only a fibroid that was unlikely causing the bleeding.

## 2010-12-06 ENCOUNTER — Other Ambulatory Visit: Payer: Self-pay | Admitting: Obstetrics and Gynecology

## 2010-12-06 ENCOUNTER — Encounter (INDEPENDENT_AMBULATORY_CARE_PROVIDER_SITE_OTHER): Payer: Self-pay | Admitting: Obstetrics and Gynecology

## 2010-12-06 ENCOUNTER — Other Ambulatory Visit: Payer: Self-pay | Admitting: Family Medicine

## 2010-12-06 DIAGNOSIS — N950 Postmenopausal bleeding: Secondary | ICD-10-CM

## 2010-12-06 DIAGNOSIS — N3020 Other chronic cystitis without hematuria: Secondary | ICD-10-CM

## 2010-12-06 DIAGNOSIS — Z1231 Encounter for screening mammogram for malignant neoplasm of breast: Secondary | ICD-10-CM

## 2010-12-06 LAB — POCT URINALYSIS DIP (DEVICE)
Bilirubin Urine: NEGATIVE
Glucose, UA: NEGATIVE mg/dL
Ketones, ur: NEGATIVE mg/dL
Nitrite: NEGATIVE
Protein, ur: NEGATIVE mg/dL
Specific Gravity, Urine: >=1.03 (ref 1.005–1.030)
Urobilinogen, UA: 0.2 mg/dL (ref 0.0–1.0)
pH: 5.5 (ref 5.0–8.0)

## 2010-12-07 NOTE — Group Therapy Note (Signed)
NAMECAITRIN, Veronica Shaw NO.:  [id number]  MEDICAL RECORD NO.:  [id number]           PATIENT TYPE:  A  LOCATION:  [facility]                   FACILITY:  [facility]  PHYSICIAN:  Argentina Donovan, MD        DATE OF BIRTH:  June 09, 1951  DATE OF SERVICE:  12/06/2010                                 CLINIC NOTE  The patient is a 60 year old gravida 3, para 2-0-1-2 who was referred by family practice because of postmenopausal bleeding.  She had not had any bleeding since she was 60 years of age.  She went into the emergency room.  When she went in, she had had urinary symptoms of burning, false urge to urinate, and they told her she did not have a bladder infection. She was pretty convinced the blood was from the urine.  She said, however, they did see blood in the vagina now.  They got an ultrasound at that time, which showed a 0.3 cm uniformly thin echogenic endometrium.  The ultrasound was normal other than that with endometrial atrophy; intramural fibroid, a small one of 1.4 x 1.4 x 1.4 cm.  The ovaries were normal.  On examination, external genitalia was normal. BUS was within normal limits.  The vagina was clean and with a loss of rugation, the cervix was nulliparous in appearance, but the cervical os was stenotic, so I did not try to go any further with this patient.  I am going to give her three Cytotec 200 mcg tabs to take the night before she comes in.  We will try again when she comes in to do an endometrial biopsy, although I am not very worried about her having anything severe. She tells me that most of her episodes 3-4 times a year of cystitis followed sexual episodes when she goes so long without sex, so I have given her a prescription for Macrobid to take before or after sex and a prescription for the Cytotec.  IMPRESSION:  Postmenopausal bleeding, possibly secondary to recurrent urinary tract infections.          ______________________________ Argentina Donovan,  MD    PR/MEDQ  D:  12/06/2010  T:  12/07/2010  Job:  [id number]

## 2010-12-10 ENCOUNTER — Ambulatory Visit (HOSPITAL_COMMUNITY)
Admission: RE | Admit: 2010-12-10 | Discharge: 2010-12-10 | Disposition: A | Discharge status: Home / Self Care | Payer: Self-pay | Source: Ambulatory Visit | Attending: Internal Medicine | Admitting: Internal Medicine

## 2010-12-10 DIAGNOSIS — Z1231 Encounter for screening mammogram for malignant neoplasm of breast: Secondary | ICD-10-CM

## 2010-12-17 NOTE — Procedures (Signed)
[place]. [facility]  Patient:    Veronica Shaw, Veronica Shaw                          MRN: [id number] Proc. Date: 02/21/01 Attending:  Anselmo Rod, M.D. CC:         Velna Hatchet, M.D.   Procedure Report  DATE OF BIRTH:  January 06, 1951  REFERRING PHYSICIAN:  Velna Hatchet, M.D.  PROCEDURE PERFORMED:  Colonoscopy with biopsies.  ENDOSCOPIST:  Anselmo Rod, M.D.  INSTRUMENT USED:  Olympus video pediatric colonoscope.  INDICATIONS FOR PROCEDURE:  The patient is a 60 year old female with a history of diarrhea and guaiac positive stools.  Rule out colonic polyps, masses, hemorrhoids, etc.  PREPROCEDURE PREPARATION:  Informed consent was procured from the patient. The patient was fasted for eight hours prior to the procedure and prepped with a bottle of magnesium citrate and a gallon of NuLytely the night prior to the procedure.  PREPROCEDURE PHYSICAL:  The patient had stable vital signs.  Neck supple. Chest clear to auscultation.  S1, S2 regular.  Abdomen soft with normal abdominal bowel sounds.  DESCRIPTION OF PROCEDURE:  The patient was placed in the left lateral decubitus position and sedated with 50 mg of Demerol and 6 mg of Versed intravenously.  Once the patient was adequately sedated and maintained on low-flow oxygen and continuous cardiac monitoring, the Olympus video colonoscope was advanced from the rectum to the cecum without difficulty.  The patient had some residual stool in the colon but the prep was fairly adequate. The appendicular orifice and the ileocecal valve were clearly visualized and photographed. The terminal ileum appeared normal.  No masses, polyps, erosions, ulcerations or hemorrhoids were seen.  There were a few scattered early diverticula throughout the colon.  Random colonic biopsies were done to rule out microscopic versus collagenous colitis.  IMPRESSION: 1. Healthy-appearing colonic mucosa including terminal ileum except for  early    scattered diverticular disease. 2. Random biopsies done to rule out microscopic versus collagenous colitis.  RECOMMENDATIONS: 1. Await pathology results. 2. Continue high fiber diet. 3. Outpatient follow-up in the next four weeks to repeat guaiac testing. DD:  02/21/01 TD:  02/21/01 Job: [id number]

## 2010-12-17 NOTE — Op Note (Signed)
[place]. [facility]  Patient:    Veronica Shaw, Veronica Shaw                          MRN: [id number] Proc. Date: 08/03/00 Adm. Date:  [id number] Disc. Date: [id number] Attending:  Olene Craven CC:         Kern Reap, M.D.   Operative Report  PREOPERATIVE DIAGNOSIS:  Symptomatic cholelithiasis.  POSTOPERATIVE DIAGNOSIS:  Chronic calculous cholecystitis.  PROCEDURE:  Laparoscopic cholecystectomy with intraoperative cholangiogram.  SURGEON:  Adolph Pollack, M.D.  ASSISTANT:  Anselm Pancoast. Zachery Dakins, M.D.  ANESTHESIA:  General.  INDICATION:  [name] is a 60 year old female who has been having varying degrees of epigastric pressure-type pain and substernal pain.  She was admitted to the hospital recently thinking this was a cardiac event, but that was negative.  She did have an ultrasound, which showed a large 2 cm gallstones and gallbladder wall thickening.  She was brought to the operating room for elective cholecystectomy.  DESCRIPTION OF PROCEDURE:  She was placed supine on the operating table, and a general anesthetic was administered.  Her abdomen was sterilely prepped and draped.  Local anesthetic was administered in the subumbilical region, and a subumbilical incision was made and carried down through the subcutaneous tissue.  A 1.5 cm incision was made in the midline fascia, and the peritoneal cavity was entered sharply and under direct vision.  Pursestring suture of 0 Vicryl was placed around the fascial edges.  A Hasson trocar was introduced to the peritoneal cavity, and a pneumoperitoneum was created by insufflation of CO2 gas.  Next, the patient was placed in the appropriate position and an incision was made in the epigastrium, through which an 11 mm trocar was placed into the peritoneal cavity.  Two small incisions were made in the right abdomen, through which 5 mm trocars were placed into the abdominal cavity.  Upon examining the right  upper quadrant are, I noticed multiple perihepatic adhesions.  These were lysed sharply.  The body of the gallbladder had adhesions between it and the duodenum, and these were lysed sharply.  The fundus was then grasped and retracted superiorly.  The infundibulum was retracted laterally.  Dissecting on the gallbladder, was identified the cystic duct and isolated it.  I could also see its insertion into the common bile duct.  At the insertion of the cystic duct to the gallbladder, a clip was placed.  An incision was made in the cystic duct.  A cholangiocatheter was passed through the anterior abdominal wall and placed into the cystic duct, and the cholangiogram was performed.  Under real-time fluoroscopy, contrast material was injected to the cystic duct.  It traversed the common bile duct and went into the duodenum readily without obvious evidence of obstruction.  Up in the common hepatic duct, there appeared to be a filling defect which was mobile, and this was from an air bubble.  I repeated the cholangiogram, and this was gone.  The final report is pending the radiologists interpretation.  I removed the cholangiocatheter, the cystic duct was clipped three times proximally and divided.  The cystic artery was identified, clipped, and divided.  Gallbladder was dissected free from the liver bed.  I noticed in the cystic artery area, there was still a little bit of oozing.  I saw a small vessel, which could have been a posterior branch, and I clipped it.  This stopped the oozing.  I then placed  a piece of Surgicel in that region.  Subsequently excised the gallbladder free from the liver bed.  I placed it in an Endopouch bag.  I irrigated the gallbladder fossa and did not notice any evidence of bile leakage or bleeding.  The gallbladder was then removed through the subumbilical port.  The subumbilical fascial defect was closed under direct vision by tightening up and tying down the  pursestring suture.  Next, the perihepatic area was irrigated and fluid was evacuated and was clear.  The trocars were removed and the pneumoperitoneum released.  The skin incisions were closed with 4-0 Monocryl subcuticular stitches, followed by Steri-Strips and sterile dressings.  She tolerated the procedure well without any apparent complications and was taken to the recovery room in satisfactory condition. DD:  08/03/00 TD:  08/03/00 Job: [id number]

## 2010-12-17 NOTE — Procedures (Signed)
[place]  Patient:    Veronica Shaw, AUSLEY Visit Number: [id number] MRN: [id number]          Service Type: END Location: ENDO Attending Physician:  Charna Elizabeth Dictated by:   Anselmo Rod, M.D. Proc. Date: 09/28/01 Admit Date:  09/28/2001 Discharge Date: 09/28/2001   CC:         Erskine Speed, M.D.  Kinnie Scales. Annalee Genta, M.D.   Procedure Report  DATE OF BIRTH:  18-Mar-1951  PROCEDURE PERFORMED:  Esophagogastroduodenoscopy.  ENDOSCOPIST:  Anselmo Rod, M.D.  INSTRUMENT USED:  Olympus video panendoscope.  INDICATIONS FOR PROCEDURE:  Dysphagia and severe reflux with hoarseness in a 60 year old female, rule out esophagitis, stricture, etc.  PREPROCEDURE PREPARATION:  Informed consent was procured from the patient. The patient was fasted for eight hours prior to the procedure.  PREPROCEDURE PHYSICAL:  VITAL SIGNS:  Stable.  NECK:  Supple.  CHEST:  Clear to auscultation, S1 and S2 regular.  ABDOMEN:  Soft with normal bowel sounds.  DESCRIPTION OF PROCEDURE:  The patient was placed in the left lateral decubitus position and sedated with 40 mg of Demerol and 4 mg of Versed intravenously.  Once the patient was adequately sedated, maintained on low flow oxygen and continuous cardiac monitoring, the Olympus video panendoscope was advanced through the mouth piece over the tongue into the esophagus under direct vision.  The entire esophagus was widely patent and appeared healthy without any evidence of esophagitis, stricture, or Barretts mucosa.  The scope was then advanced into the stomach.  Except for mild antral gastritis, no other abnormalities were noted.  The proximal small bowel appeared normal as well.  IMPRESSION: 1. Normal esophagogastroduodenoscopy except for mild antral gastritis. 2. Normal-appearing esophagus which is widely patent.  RECOMMENDATIONS: 1. Continue PPIs for now. 2. Outpatient follow up in the next two  weeks or earlier if need be. 3. A 24-hour pH study and esophageal manometry will be planned.  Further    recommendations made after these studies have been done. Dictated by:   Anselmo Rod, M.D. Attending Physician:  Charna Elizabeth DD:  10/01/01 TD:  10/02/01 Job: [id number]

## 2010-12-17 NOTE — Group Therapy Note (Signed)
NAMEJEWELINE, REIF NO.:  [id number]   MEDICAL RECORD NO.:  [id number]          PATIENT TYPE:  WOC   LOCATION:  [facility]                   FACILITY:  [facility]   PHYSICIAN:  Argentina Donovan, MD        DATE OF BIRTH:  July 06, 1951   DATE OF SERVICE:  11/23/2004                                    CLINIC NOTE   The patient is a 60 year old gravida 3, para 2, 0, 1, 2 who in January had a  Pap smear at [facility] clinic which showed a few squamous cells  which suggested that this was abnormal, repeat in 3 months with HPV which we  have done today. It that is abnormal we will schedule for colposcopy. The  patient apparently had an abnormal Pap smear many years ago but this did not  have any recurrence since.   IMPRESSION:  Slightly abnormal Pap smear.      PR/MEDQ  D:  11/23/2004  T:  11/24/2004  Job:  [id number]

## 2010-12-22 ENCOUNTER — Other Ambulatory Visit (HOSPITAL_COMMUNITY)
Admission: RE | Admit: 2010-12-22 | Discharge: 2010-12-22 | Disposition: A | Discharge status: Home / Self Care | Payer: Self-pay | Source: Ambulatory Visit | Attending: Obstetrics and Gynecology | Admitting: Obstetrics and Gynecology

## 2010-12-22 ENCOUNTER — Other Ambulatory Visit: Payer: Self-pay | Admitting: Obstetrics and Gynecology

## 2010-12-22 ENCOUNTER — Ambulatory Visit (INDEPENDENT_AMBULATORY_CARE_PROVIDER_SITE_OTHER): Payer: Self-pay | Admitting: Obstetrics and Gynecology

## 2010-12-22 DIAGNOSIS — N925 Other specified irregular menstruation: Secondary | ICD-10-CM | POA: Insufficient documentation

## 2010-12-22 DIAGNOSIS — N949 Unspecified condition associated with female genital organs and menstrual cycle: Secondary | ICD-10-CM | POA: Insufficient documentation

## 2010-12-22 DIAGNOSIS — N938 Other specified abnormal uterine and vaginal bleeding: Secondary | ICD-10-CM | POA: Insufficient documentation

## 2010-12-22 DIAGNOSIS — N950 Postmenopausal bleeding: Secondary | ICD-10-CM

## 2010-12-23 NOTE — Group Therapy Note (Signed)
NAMESHEYLIN, SCHARNHORST NO.:  [id number]  MEDICAL RECORD NO.:  [id number]           PATIENT TYPE:  A  LOCATION:  [facility]                   FACILITY:  [facility]  PHYSICIAN:  Argentina Donovan, MD        DATE OF BIRTH:  10-12-1950  DATE OF SERVICE:  12/22/2010                                 CLINIC NOTE  The patient was in last week with postmenopausal bleeding.  We gave her Cytotec because of the cervical stenosis to come back before we could do an endometrial biopsy which were easily able to do today.  Tenaculum on the anterior lip of the cervix using a yellow dilator easily was able to dilate the cervix and sound to 6-cm and take a biopsy with the aspirate showing not much tissue, but the ultrasound looked like an atrophic endometrium.  Tenaculum was removed.  The patient tolerated the procedure well.  IMPRESSION:  Atrophic endometrium.  The patient will return in 2 weeks for results.          ______________________________ Argentina Donovan, MD    PR/MEDQ  D:  12/22/2010  T:  12/23/2010  Job:  [id number]

## 2011-01-19 ENCOUNTER — Ambulatory Visit (INDEPENDENT_AMBULATORY_CARE_PROVIDER_SITE_OTHER): Payer: Self-pay | Admitting: Obstetrics and Gynecology

## 2011-01-19 DIAGNOSIS — N888 Other specified noninflammatory disorders of cervix uteri: Secondary | ICD-10-CM

## 2011-02-07 ENCOUNTER — Telehealth: Payer: Self-pay | Admitting: Family Medicine

## 2011-02-07 NOTE — Telephone Encounter (Signed)
Pt is requesting an rx for shingles vaccine, says she can get it $100 cheaper by going to her pharmacy.

## 2011-02-08 ENCOUNTER — Telehealth: Payer: Self-pay | Admitting: *Deleted

## 2011-02-08 ENCOUNTER — Encounter: Payer: Self-pay | Admitting: Family Medicine

## 2011-02-08 DIAGNOSIS — Z23 Encounter for immunization: Secondary | ICD-10-CM

## 2011-02-08 MED ORDER — ZOSTER VACCINE LIVE 19400 UNT/0.65ML ~~LOC~~ SOLR: 0.6500 mL | Freq: Once | SUBCUTANEOUS | Status: DC

## 2011-02-08 NOTE — Telephone Encounter (Signed)
This encounter was created in error - please disregard.

## 2011-02-08 NOTE — Telephone Encounter (Signed)
i put the order in the computer.

## 2011-02-08 NOTE — Telephone Encounter (Signed)
rx for shingles vaccine was sent to [facility] and we received notice that [facility] does not have. Patient notified and she request it be sent to [facility] . Rx sent.

## 2011-02-08 NOTE — Telephone Encounter (Signed)
Patient calls wanting to receive the shingles vaccine. Advised will send message to MD to ask her to order this and she will need to go to her pharmacy to receive.

## 2011-02-08 NOTE — Telephone Encounter (Signed)
Addended by: Orvil Feil L on: 02/08/2011 01:54 PM   Modules accepted: Orders

## 2011-02-08 NOTE — Telephone Encounter (Signed)
error 

## 2011-02-15 NOTE — Telephone Encounter (Signed)
rx done 02/08/11, closing encounter.

## 2011-03-15 ENCOUNTER — Other Ambulatory Visit: Payer: Self-pay | Admitting: Family Medicine

## 2011-03-15 MED ORDER — METFORMIN HCL 500 MG PO TABS: 500.0000 mg | ORAL_TABLET | Freq: Two times a day (BID) | ORAL | Status: DC

## 2011-03-29 ENCOUNTER — Other Ambulatory Visit: Payer: Self-pay | Admitting: Family Medicine

## 2011-03-29 MED ORDER — PANTOPRAZOLE SODIUM 40 MG PO TBEC: 40.0000 mg | DELAYED_RELEASE_TABLET | Freq: Every day | ORAL | Status: DC

## 2011-03-29 MED ORDER — DESLORATADINE 5 MG PO TABS: 5.0000 mg | ORAL_TABLET | Freq: Every day | ORAL | Status: DC

## 2011-04-14 ENCOUNTER — Other Ambulatory Visit: Payer: Self-pay | Admitting: Family Medicine

## 2011-04-14 MED ORDER — VALSARTAN 160 MG PO TABS: 160.0000 mg | ORAL_TABLET | Freq: Every day | ORAL | Status: DC

## 2011-04-25 ENCOUNTER — Other Ambulatory Visit: Payer: Self-pay | Admitting: Family Medicine

## 2011-04-25 MED ORDER — DESLORATADINE 5 MG PO TABS: 5.0000 mg | ORAL_TABLET | Freq: Every day | ORAL | Status: DC

## 2011-04-25 MED ORDER — VALSARTAN 160 MG PO TABS: 160.0000 mg | ORAL_TABLET | Freq: Every day | ORAL | Status: DC

## 2011-04-25 MED ORDER — PANTOPRAZOLE SODIUM 40 MG PO TBEC: 40.0000 mg | DELAYED_RELEASE_TABLET | Freq: Every day | ORAL | Status: DC

## 2011-05-05 ENCOUNTER — Ambulatory Visit: Payer: Self-pay | Admitting: Family Medicine

## 2011-05-20 ENCOUNTER — Encounter: Payer: Self-pay | Admitting: Family Medicine

## 2011-05-20 ENCOUNTER — Ambulatory Visit (INDEPENDENT_AMBULATORY_CARE_PROVIDER_SITE_OTHER): Payer: Self-pay | Admitting: Family Medicine

## 2011-05-20 VITALS — BP 161/81 | HR 125 | Temp 98.9°F | Ht 62.5 in | Wt 134.0 lb

## 2011-05-20 DIAGNOSIS — J45909 Unspecified asthma, uncomplicated: Secondary | ICD-10-CM

## 2011-05-20 DIAGNOSIS — Z91018 Allergy to other foods: Secondary | ICD-10-CM

## 2011-05-20 DIAGNOSIS — T7840XA Allergy, unspecified, initial encounter: Secondary | ICD-10-CM

## 2011-05-20 DIAGNOSIS — E119 Type 2 diabetes mellitus without complications: Secondary | ICD-10-CM

## 2011-05-20 LAB — POCT GLYCOSYLATED HEMOGLOBIN (HGB A1C): Hemoglobin A1C: 6.3

## 2011-05-20 MED ORDER — EPINEPHRINE 0.3 MG/0.3ML IJ DEVI: 0.3000 mg | Freq: Once | INTRAMUSCULAR | Status: DC

## 2011-05-20 MED ORDER — FLUTICASONE-SALMETEROL 500-50 MCG/DOSE IN AEPB: 1.0000 | INHALATION_SPRAY | Freq: Two times a day (BID) | RESPIRATORY_TRACT | Status: DC

## 2011-05-20 NOTE — Patient Instructions (Addendum)
Please make a nurse appt in 1-2 weeks for your flu shot  If you would like to go to the allergist, give me a call.  Please pick up your epipen and carry it with you Avoid tree nutsFood Allergy and Anaphylaxis A food allergy occurs when the body reacts to proteins found in food. In the most common type of food allergy, the immune system creates chemicals usually made to fight germs (antibodies). These chemicals cause problems when the protein is eaten. Problems can also happen when the food is touched or bits of food get into the air (such as while cooking) near someone who is allergic. The problems caused are the allergic symptoms. This type of food allergy must be taken seriously. Even very small amounts of a food can cause a reaction. Severe reactions can be sudden and potentially fatal. This type of food allergy can vary from mild to life threatening (anaphylaxis). It is impossible to predict what the next reaction will be like based on previous reactions.   There can be other problems with food that are not actually allergies. This document only reviews food allergies. CAUSES   It is not known why some people develop food allergy. It may be more common in families with other allergic problems. Any food can cause allergies but the most common ones are:  Peanuts.     Tree nuts (such as almonds, walnuts, hazelnuts, and pecans).     Fish (such as bass, flounder, and cod).     Shellfish (such as crab, lobster, and shrimp).     Milk.    Eggs.    Wheat.    Soybeans.  SYMPTOMS  The symptoms of food allergy generally start within minutes to 2 hours after contact with the allergen. The symptoms can remain the same for several hours or get worse. Sometimes the reaction seems to improve only to return (often worse) later. Common signs/symptoms of a reaction include any or all of the following:  Skin: hives, itching, swelling, flushing.     Eyes: red, watery, itchy, swollen.     Nose:  congested, runny, sneezing.     Mouth/throat: swelling of lips, tongue and throat. Itchy throat, hoarseness, choking sensation.     Lungs: Cough, difficulty breathing, wheezing (whistling noise when breathing out).     Gastrointestinal tract: Nausea (feeling sick to one's stomach), vomiting, diarrhea, cramps.     Heart and circulation: dizziness, weakness, fainting, turning pale, fast, slow or irregular pulse.     Nervous system: confusion, fear, sense of doom.  Anaphylaxis is the most serious food allergy reaction. It can rapidly cause throat and tongue swelling, difficulty breathing, low blood pressure, collapse and cardiac arrest (heart stops breathing). DIAGNOSIS   The diagnosis of food allergy is made in part on the history of prior reactions. To prove the diagnosis and to find what foods are responsible, your caregiver may suggest:  Allergy skin tests - usually done by allergists in a setting where emergency treatment is available.     Blood tests for allergy.     Food challenges - suspected food is given and the person is observed in a setting where emergency treatment is available.     Food diary - recording foods eaten and reactions.     Elimination diet - suspected food(s) are removed from the diet.  TREATMENT   Your caregiver may prescribe medicines to treat an allergic reaction such as:  Epinephrine (also called adrenaline), which comes in a self-injecting device.  This is the most important medicine for treating serious food allergies.     Asthma medicine - usually inhaled - to treat breathing problems - in addition to epinephrine.     Antihistamines - to treat swelling and itching - in addition to epinephrine.     Steroids - given to calm down a serious reaction.  Children can outgrow certain food allergies (like milk and eggs). Peanut and tree nut allergies are less likely to be outgrown. Because of this, sometimes repeat allergy testing is done. HOME CARE  INSTRUCTIONS   Avoid contact with the offending food. Strict avoidance is the only way to prevent a reaction.    Keep the food out of the house.     Learn how to read food labels in order to spot the presence of any food you are allergic to. If a packaged food product does not contain an ingredient label, avoid the food product.     If you must have the offending food in the house, discuss how to avoid it with your caregiver.  Be especially careful when eating in a restaurant.  Ask the cook or manager (not the waiter) if foods you are allergic to are found in any items on the menu.     Avoid:    [place] foods since oil can keep proteins from previously cooked foods.     Ice cream parlors, Asian restaurants and bakeries - these often use peanut or tree nut ingredients.     Buffets, picnics, school lunches and salad bars because of the risk of contact with foods you are allergic to.     Food prepared in a blender or shared grill.     Request that food be prepared with clean utensils or pans to avoid contamination from prior foods.     Let the staff know you have allergies that can cause serious reactions or death.     If you have a reaction, administer treatment and tell the staff to immediately call for emergency services ( 911 in the [place]  If planning travel by air, inform the airline ahead of time (when making the reservation) that you have a food allergy. Wear a medical identification bracelet or necklace (such as one offered by [employer] ) noting your allergy.   If an epinephrine self-injector device is prescribed:  Carry your device everywhere at all times.     Learn how to use the device. Practice by injecting an expired injector into an orange.     Teach all family members, teachers, coaches, etc to use the injector.     Make an injector available at school, work, Catering manager.     Keep a spare injector in your car.     Educate others about your allergy. This includes school  teachers and other school personnel. Tell them what you need to avoid, the symptoms of an allergic reaction, and how others can help during an allergic emergency.     If you experience a subsequent allergic reaction, administer epinephrine promptly and immediately call for emergency services (911 in the [place]  SEEK MEDICAL CARE IF:    You have questions about food allergy or its treatments.     Allergy reactions are getting worse or more frequent.     You suspect new food allergies.  SEEK IMMEDIATE MEDICAL CARE IF:    You or your child has a serious allergic reaction, even if you have given a shot of epinephrine.     Symptoms return  after taking prescribed treatments.  MAKE SURE YOU:    Understand these instructions.     Will watch your condition.     Will get help right away if you are not doing well or get worse.  Document Released: 04/26/2008 Document Revised: 03/30/2011 Document Reviewed: 06/04/2008 [employer] Patient Information 2012 [place], [place].

## 2011-05-20 NOTE — Progress Notes (Signed)
  Subjective:    Patient ID: Veronica Shaw, female    DOB: 06-Sep-1950, 60 y.o.   MRN: [id number]  HPI  DM- taking meds as prescribed.  Watching her carb intake.  Walking 3x/week at least.  No low CBGs.  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  Within 2 hours, this progressed to a full body rash and she was wheezing some.  She took a clarinex and used her inhaler.  Today she is not wheezing but is itching on her rash  Asthma-  Pt is wheezing occasionally and she thinks this is due to being allergic to her dogs.  She is taking her advair once a day and she is using albuterol at night 3x/week.    Review of Systems Denies HA, SOB, CP or N/V/D    Objective:   Physical Exam  Vital signs reviewed General appearance - alert, well appearing, and in no distress and oriented to person, place, and time Heart - normal rate, regular rhythm, normal S1, S2, no murmurs, rubs, clicks or gallops Chest - mild wheeze in upper lung fields Skin- red plaques on face, chest and arms      Assessment & Plan:

## 2011-05-20 NOTE — Assessment & Plan Note (Addendum)
Will give epipen.  benedryl for symptoms.  Told to avoid tree nuts.  Will send to allergist for testing if pt desires.

## 2011-05-20 NOTE — Assessment & Plan Note (Signed)
Advised to take advair BID every day and not to use albuterol unless needed.

## 2011-05-20 NOTE — Assessment & Plan Note (Signed)
Lab Results  Component Value Date   HGBA1C 6.3 05/20/2011   Doing well.  Continue current tx

## 2011-05-21 ENCOUNTER — Inpatient Hospital Stay (INDEPENDENT_AMBULATORY_CARE_PROVIDER_SITE_OTHER)
Admission: RE | Admit: 2011-05-21 | Discharge: 2011-05-21 | Disposition: A | Discharge status: Home / Self Care | Payer: Self-pay | Source: Ambulatory Visit | Attending: Emergency Medicine | Admitting: Emergency Medicine

## 2011-05-21 DIAGNOSIS — I498 Other specified cardiac arrhythmias: Secondary | ICD-10-CM

## 2011-05-21 DIAGNOSIS — L509 Urticaria, unspecified: Secondary | ICD-10-CM

## 2011-05-24 ENCOUNTER — Telehealth: Payer: Self-pay | Admitting: Family Medicine

## 2011-05-24 DIAGNOSIS — Z91018 Allergy to other foods: Secondary | ICD-10-CM

## 2011-05-24 NOTE — Telephone Encounter (Signed)
No referral in chart, will forward to MD. 

## 2011-05-24 NOTE — Telephone Encounter (Signed)
i put in referral but pt should be seen if still having problems since allergist referral will take some time

## 2011-05-24 NOTE — Telephone Encounter (Signed)
Pt is asking about being referred to allergist.  She was here last Friday - pls advise She has hives all over- and Benedryl makes it worse. Went to [facility] on Saturday and they gave her steroids.  She needs to see allergist asap.

## 2011-05-25 NOTE — Telephone Encounter (Signed)
Left message on vm that referral had been sent through [facility], but if she was still having problems to come back in to see MD.

## 2011-05-27 ENCOUNTER — Ambulatory Visit (INDEPENDENT_AMBULATORY_CARE_PROVIDER_SITE_OTHER): Payer: Self-pay | Admitting: Family Medicine

## 2011-05-27 ENCOUNTER — Encounter: Payer: Self-pay | Admitting: Family Medicine

## 2011-05-27 DIAGNOSIS — L508 Other urticaria: Secondary | ICD-10-CM

## 2011-05-27 DIAGNOSIS — R000 Tachycardia, unspecified: Secondary | ICD-10-CM

## 2011-05-27 DIAGNOSIS — I498 Other specified cardiac arrhythmias: Secondary | ICD-10-CM

## 2011-05-27 MED ORDER — CETIRIZINE HCL 10 MG PO TABS: 10.0000 mg | ORAL_TABLET | Freq: Every day | ORAL | Status: DC

## 2011-05-27 MED ORDER — ATENOLOL 50 MG PO TABS: 50.0000 mg | ORAL_TABLET | Freq: Every day | ORAL | Status: DC

## 2011-05-27 NOTE — Progress Notes (Signed)
  Subjective:    Patient ID: Veronica Shaw, female    DOB: 1951-04-23, 60 y.o.   MRN: [id number]  HPI  Patient returns for followup of her urticaria. She's continued to have hives since her last visit. she had to go to urgent care for evaluation.  They put her on 7 days of prednisone there. Since then she's continued to have hives. She denies any further contact with the walnuts. Her hives seem to come without warning and occasionally even up to the middle the night. She occasionally has some respiratory symptoms for this. She uses albuterol and she does okay.  Patient notes that she has been on atenolol in the past her racing heartbeats. When she went to urgent care, she was seen to have a pulse between 120 and 140. This was noted to be sinus tach. They restarted her on atenolol. She has done well with this.  Review of Systems Denies CP, SOB, HA, N/V/D, fever     Objective:   Physical Exam  Vital signs reviewed General appearance - alert, well appearing, and in no distress and oriented to person, place, and time Heart - normal rate, regular rhythm, normal S1, S2, no murmurs, rubs, clicks or gallops Chest - clear to auscultation, no wheezes, rales or rhonchi, symmetric air entry, no tachypnea, retractions or cyanosis Skin-patient has some fine papular rash at the neck she has some excoriation on her back. No hives noted today.      Assessment & Plan:

## 2011-05-27 NOTE — Assessment & Plan Note (Addendum)
Patient is currently on Clarinex every day. I asked her to start Zyrtec at night. I asked her to do a diary of her symptoms. I will see her in one week to review. I reviewed her urgent care chart today. I will continue to work on her allergy referral, which needs project access.

## 2011-05-27 NOTE — Assessment & Plan Note (Signed)
Patient was previously on atenolol. She is currently on 25 mg atenolol now. I will increase it to 50 mg I will see her in one week.

## 2011-05-27 NOTE — Patient Instructions (Signed)
I am sorry you're still having all these hives. I would like you to have a Zyrtec at night. This might help you control he allergy symptoms. I will continue your atenolol but I will increase the dose to 50 mg We will continue to work on getting you to the allergist. I would like you to keep a diary of when these things are triggered in what you're doing and eating. I would like you to come back and see me in one month to discuss it or earlier if things are not getting better.

## 2011-06-17 ENCOUNTER — Ambulatory Visit (INDEPENDENT_AMBULATORY_CARE_PROVIDER_SITE_OTHER): Payer: Self-pay | Admitting: *Deleted

## 2011-06-17 DIAGNOSIS — Z23 Encounter for immunization: Secondary | ICD-10-CM

## 2011-08-05 ENCOUNTER — Other Ambulatory Visit: Payer: Self-pay | Admitting: Family Medicine

## 2011-08-05 MED ORDER — VALSARTAN 160 MG PO TABS: 160.0000 mg | ORAL_TABLET | Freq: Every day | ORAL | Status: DC

## 2011-08-30 ENCOUNTER — Other Ambulatory Visit: Payer: Self-pay | Admitting: Family Medicine

## 2011-08-30 MED ORDER — ALBUTEROL SULFATE HFA 108 (90 BASE) MCG/ACT IN AERS: 2.0000 | INHALATION_SPRAY | RESPIRATORY_TRACT | Status: DC | PRN

## 2011-09-27 ENCOUNTER — Ambulatory Visit: Payer: Self-pay | Admitting: Family Medicine

## 2011-10-13 ENCOUNTER — Encounter: Payer: Self-pay | Admitting: Family Medicine

## 2011-10-13 ENCOUNTER — Ambulatory Visit (INDEPENDENT_AMBULATORY_CARE_PROVIDER_SITE_OTHER): Payer: Self-pay | Admitting: Family Medicine

## 2011-10-13 VITALS — BP 137/76 | HR 84 | Temp 98.2°F | Ht 62.5 in | Wt 137.0 lb

## 2011-10-13 DIAGNOSIS — R5383 Other fatigue: Secondary | ICD-10-CM | POA: Insufficient documentation

## 2011-10-13 DIAGNOSIS — R5381 Other malaise: Secondary | ICD-10-CM

## 2011-10-13 DIAGNOSIS — E119 Type 2 diabetes mellitus without complications: Secondary | ICD-10-CM

## 2011-10-13 DIAGNOSIS — I10 Essential (primary) hypertension: Secondary | ICD-10-CM

## 2011-10-13 LAB — COMPREHENSIVE METABOLIC PANEL
ALT: 18 U/L (ref 0–35)
AST: 25 U/L (ref 0–37)
Albumin: 4.5 g/dL (ref 3.5–5.2)
Alkaline Phosphatase: 110 U/L (ref 39–117)
BUN: 17 mg/dL (ref 6–23)
CO2: 32 mEq/L (ref 19–32)
Calcium: 10.1 mg/dL (ref 8.4–10.5)
Chloride: 104 mEq/L (ref 96–112)
Creat: 0.84 mg/dL (ref 0.50–1.10)
Glucose, Bld: 124 mg/dL — ABNORMAL HIGH (ref 70–99)
Potassium: 4.2 mEq/L (ref 3.5–5.3)
Sodium: 143 mEq/L (ref 135–145)
Total Bilirubin: 0.5 mg/dL (ref 0.3–1.2)
Total Protein: 6.9 g/dL (ref 6.0–8.3)

## 2011-10-13 LAB — CBC
HCT: 44.3 % (ref 36.0–46.0)
Hemoglobin: 14.4 g/dL (ref 12.0–15.0)
MCH: 29.8 pg (ref 26.0–34.0)
MCHC: 32.5 g/dL (ref 30.0–36.0)
MCV: 91.5 fL (ref 78.0–100.0)
Platelets: 251 10*3/uL (ref 150–400)
RBC: 4.84 MIL/uL (ref 3.87–5.11)
RDW: 12.9 % (ref 11.5–15.5)
WBC: 6.2 10*3/uL (ref 4.0–10.5)

## 2011-10-13 LAB — T4, FREE: Free T4: 1.4 ng/dL (ref 0.80–1.80)

## 2011-10-13 LAB — TSH: TSH: 1.544 u[IU]/mL (ref 0.350–4.500)

## 2011-10-13 LAB — POCT GLYCOSYLATED HEMOGLOBIN (HGB A1C): Hemoglobin A1C: 6.6

## 2011-10-13 LAB — LDL CHOLESTEROL, DIRECT: Direct LDL: 106 mg/dL — ABNORMAL HIGH

## 2011-10-13 NOTE — Assessment & Plan Note (Signed)
Fatigue and hair loss.  Check CBC and TSH.  Had been previously diagnosed as hypothyroid but not rechecked in 3 years.

## 2011-10-13 NOTE — Assessment & Plan Note (Signed)
Checked A1c  Lab Results  Component Value Date   HGBA1C 6.6 10/13/2011   Checked cholesterol today as well.    Needs optho appt.  Will send in referral for project access.

## 2011-10-13 NOTE — Assessment & Plan Note (Signed)
BP Readings from Last 3 Encounters:  10/13/11 137/76  05/27/11 151/74  05/20/11 161/81   Well controlled.  Continue current regimen

## 2011-10-13 NOTE — Progress Notes (Signed)
  Subjective:    Patient ID: Veronica Shaw, female    DOB: 1951-03-27, 61 y.o.   MRN: [id number]  HPI  HYPERTENSION Disease Monitoring Blood pressure range-not checking at home Chest pain- no      Dyspnea- no Medications Compliance- good Lightheadedness- none   Edema- none   DIABETES Disease Monitoring Blood Sugar ranges-120-150 Polyuria- none  New Visual problems- blurry vision, going to optometrist.  Needs to go to ophthalmologist but has orange card. Medications Compliance- good  Hypoglycemic symptoms- none   HYPERLIPIDEMIA Disease Monitoring See symptoms for Hypertension Medications Not currently on any medication. Had been treated in the past.  Fatigue-patient notes in the last several months she's been more fatigued. She's also noted some hair thinning. She says it several years ago she diagnosed as hypothyroid and started on Synthroid dementia stopped and she was moved to [place]. This has not been checked in several years. She would like to know if this is low today. She says she is often cold but denies constipation.  ROS See HPI above   PMH Smoking Status noted    Review of Systems     Objective:   Physical Exam  Vital signs reviewed General appearance - alert, well appearing, and in no distress and oriented to person, place, and time Hair-areas of her scalp are visible in her hair appears thin. Chest - clear to auscultation, no wheezes, rales or rhonchi, symmetric air entry, no tachypnea, retractions or cyanosis Heart - normal rate, regular rhythm, normal S1, S2, no murmurs, rubs, clicks or gallops Abdomen - soft, nontender, nondistended, no masses or organomegaly Feet-examined today. No worrisome calluses. Right great toe with lack of sensation which patient reports is in the same since a bunionectomy      Assessment & Plan:

## 2011-10-13 NOTE — Patient Instructions (Signed)
It was nice to see you today I will check her thyroid and some blood counts for your hair loss and fatigue We are also checking your cholesterol I will call you with these results  Please come back and see me in 3 months for followup or before if you need  I will put in a referral for the eye doctor but it may take a long time to go through Please go see your optometrist that you're having trouble with your vision

## 2011-10-17 ENCOUNTER — Telehealth: Payer: Self-pay | Admitting: Family Medicine

## 2011-10-17 NOTE — Telephone Encounter (Signed)
Called patient to tell her that all of her lab results were fairly normal. I do not have a reason for her fatigue from this. There was no voicemail so I did not leave a message.

## 2011-10-18 NOTE — Telephone Encounter (Signed)
Pt informed. Veronica Shaw  

## 2011-11-17 ENCOUNTER — Ambulatory Visit
Admission: RE | Admit: 2011-11-17 | Discharge: 2011-11-17 | Disposition: A | Discharge status: Home / Self Care | Payer: No Typology Code available for payment source | Source: Ambulatory Visit | Attending: Family Medicine | Admitting: Family Medicine

## 2011-11-17 ENCOUNTER — Encounter: Payer: Self-pay | Admitting: Family Medicine

## 2011-11-17 ENCOUNTER — Telehealth: Payer: Self-pay | Admitting: Family Medicine

## 2011-11-17 ENCOUNTER — Ambulatory Visit (INDEPENDENT_AMBULATORY_CARE_PROVIDER_SITE_OTHER): Payer: No Typology Code available for payment source | Admitting: Family Medicine

## 2011-11-17 VITALS — BP 165/80 | HR 103 | Temp 98.5°F | Ht 62.5 in | Wt 141.0 lb

## 2011-11-17 DIAGNOSIS — J45909 Unspecified asthma, uncomplicated: Secondary | ICD-10-CM

## 2011-11-17 DIAGNOSIS — R062 Wheezing: Secondary | ICD-10-CM

## 2011-11-17 MED ORDER — BENZONATATE 100 MG PO CAPS: 100.0000 mg | ORAL_CAPSULE | Freq: Four times a day (QID) | ORAL | Status: DC | PRN

## 2011-11-17 MED ORDER — PREDNISONE 20 MG PO TABS
60.0000 mg | ORAL_TABLET | Freq: Once | ORAL | Status: AC
  Administered 2011-11-17: 60 mg via ORAL

## 2011-11-17 MED ORDER — PREDNISONE 50 MG PO TABS: 50.0000 mg | ORAL_TABLET | Freq: Every day | ORAL | Status: DC

## 2011-11-17 MED ORDER — ALBUTEROL SULFATE (2.5 MG/3ML) 0.083% IN NEBU
2.5000 mg | INHALATION_SOLUTION | Freq: Once | RESPIRATORY_TRACT | Status: AC
  Administered 2011-11-17: 2.5 mg via RESPIRATORY_TRACT

## 2011-11-17 NOTE — Progress Notes (Signed)
  Subjective:    Patient ID: Veronica Shaw, female    DOB: 1951-07-21, 61 y.o.   MRN: [id number]  HPI Patient with 5 days of sore throat, heart palpitations, cough that is worse at night. The cough is productive of phlegm. She does not have any fevers. She is fatigued. She has not changed any medications or tried any medicines for this. She states that she tried her albuterol once for the cough and that it didn't help. She has some right-sided pleuritic chest pain on the base of her rib. This also hurts when she moves her arm. Patient has had shortness of breath but she denies dizziness. She denies nausea, vomiting, diarrhea. She has not had rash. Patient denies sick contacts. She is a nonsmoker.   Review of Systems     Objective:   Physical Exam Vital signs reviewed General appearance - alert, mildly uncomfortable appearing but in no distress Heart - normal rate, regular rhythm, normal S1, S2, no murmurs, rubs, clicks or gallops Lungs-scattered wheezes throughout lung fields. Mildly decreased air movement at the bases. No rhonchi heard. Wheezes improved after nebulization. Still mildly decreased air movement at the bases. Ears - bilateral TM's with air-fluid level. Without injection. Nose - thick mucoid discharge present Throat-mild erythema posterior pharynx, no drainage, no tonsillar enlargement         Assessment & Plan:

## 2011-11-17 NOTE — Patient Instructions (Signed)
I am sorry have not been feeling well I think that you have had a cold virus that has triggered your asthma I would like you to take prednisone for the next 5 days to calm things down Please keep using your albuterol every 4 hours while you're awake You can use Tessalon Perles for cough Come back and see me early next week to make sure you're feeling better If you are completely better, you can cancel her appointment

## 2011-11-17 NOTE — Telephone Encounter (Signed)
Pt informed and appt made for Friday Morning. Delene Morais, Maryjo Rochester

## 2011-11-17 NOTE — Assessment & Plan Note (Addendum)
Exacerbation today. Treated with albuterol in the office with improvement of O2 sat to 97%. Plan to treat with steroids x5 days. Gave first dose in office. Sent for chest x-ray due to right-sided pleuritic chest pain. Patient to return next week for recheck.

## 2011-11-17 NOTE — Telephone Encounter (Signed)
Please let pt know that xray was negative for pneumonia or other cause of cough.  I would like her to follow up next week even if she is feeling better just to be checked since she did not think she was wheezing today.

## 2011-11-25 ENCOUNTER — Ambulatory Visit (HOSPITAL_COMMUNITY)
Admission: RE | Admit: 2011-11-25 | Discharge: 2011-11-25 | Disposition: A | Discharge status: Home / Self Care | Payer: Self-pay | Source: Ambulatory Visit | Attending: Family Medicine | Admitting: Family Medicine

## 2011-11-25 ENCOUNTER — Ambulatory Visit (INDEPENDENT_AMBULATORY_CARE_PROVIDER_SITE_OTHER): Payer: Self-pay | Admitting: Family Medicine

## 2011-11-25 ENCOUNTER — Telehealth: Payer: Self-pay | Admitting: Family Medicine

## 2011-11-25 ENCOUNTER — Encounter: Payer: Self-pay | Admitting: Family Medicine

## 2011-11-25 DIAGNOSIS — R079 Chest pain, unspecified: Secondary | ICD-10-CM

## 2011-11-25 LAB — D-DIMER, QUANTITATIVE (NOT AT ARMC): D-Dimer, Quant: 0.44 ug/mL-FEU (ref 0.00–0.48)

## 2011-11-25 NOTE — Patient Instructions (Signed)
I will call you this afternoon with your lab results Assuming this is normal, I would like you to take 400 mg of Motrin 3 times a day for the next 5 days with meals  I will see you next week for recheck We will put in a referral for cardiology to have them check you over as well

## 2011-11-25 NOTE — Assessment & Plan Note (Addendum)
Given that this pain is replicated with movement of her arms, I think it is likely to be musculoskeletal in origin. Because it is also with deep breaths and exertion, we will check a d-dimer today to evaluate her risk for PE. As she does not have any calf swelling or size difference in her O2 sat and vitals are stable, I think a PE is less likely. Given that she is diabetic as well as a woman, this could be an anginal equivalent. Plan to send for stress test with cardiac consult. I have walked her around the clinic without desaturation. I plan to treat with it course of NSAIDs once d-dimer has returned. I will see her next week for recheck

## 2011-11-25 NOTE — Telephone Encounter (Signed)
pls let pt know that lab is normal, should start motrin as planned

## 2011-11-25 NOTE — Telephone Encounter (Signed)
LMOVM informing pt...............................Veronica Shaw, CMA  

## 2011-11-25 NOTE — Progress Notes (Signed)
  Subjective:    Patient ID: Veronica Shaw, female    DOB: [birth year], 61 y.o.   MRN: [id number]  HPI  Patient presents for followup 1 week after the initial evaluation for shortness of breath. She states that her breathing is better and that she is not wheezing. However, her chest pain is worse. She describes this pain as right-sided and very sharp. The longest it has lasted is about 10 minute period it usually lasts 1-2 minutes. This pain occurs when she takes a deep breath as well as when she is walking and when she lifts her arms. This pain is happened when she is in bed at rest. She says it is in the center of her chest into the right and it radiates to the right arm. She does not sweat when it happens. She is not nauseous when it happens.  She says that over the last week she spent most of the week in bed because when she walks she gets increasingly short of breath and has this chest pain. She describes a trip 2 weeks ago to [place] in which her legs swelled up significantly on the plain. She took 2 or 3 doses of her husbands Lasix, and this improved. She has not had any further leg swelling and she denies any pain in her calves.  Patient is a diabetic, her last A1c was 6.6. She had a cardiac evaluation in 2001 which was normal. Review of Systems As above, otherwise neg    Objective:   Physical Exam  Vital signs reviewed General appearance - alert, well appearing, and in no distress Heart - normal rate, regular rhythm, normal S1, S2, no murmurs, rubs, clicks or gallops Chest - clear to auscultation, no wheezes, rales or rhonchi, symmetric air entry, no tachypnea, retractions or cyanosis Unable to replicate pain with palpation. Extremities - peripheral pulses normal, no pedal edema, no clubbing or cyanosis Raising of right arm replicates pain       Assessment & Plan:

## 2011-12-02 ENCOUNTER — Encounter: Payer: Self-pay | Admitting: Cardiology

## 2011-12-02 ENCOUNTER — Ambulatory Visit: Payer: Self-pay | Admitting: Family Medicine

## 2011-12-02 ENCOUNTER — Ambulatory Visit (INDEPENDENT_AMBULATORY_CARE_PROVIDER_SITE_OTHER): Payer: Self-pay | Admitting: Cardiology

## 2011-12-02 DIAGNOSIS — R079 Chest pain, unspecified: Secondary | ICD-10-CM

## 2011-12-02 DIAGNOSIS — R000 Tachycardia, unspecified: Secondary | ICD-10-CM

## 2011-12-02 DIAGNOSIS — I498 Other specified cardiac arrhythmias: Secondary | ICD-10-CM

## 2011-12-02 DIAGNOSIS — E785 Hyperlipidemia, unspecified: Secondary | ICD-10-CM

## 2011-12-02 DIAGNOSIS — I10 Essential (primary) hypertension: Secondary | ICD-10-CM

## 2011-12-02 MED ORDER — DILTIAZEM HCL ER 120 MG PO CP12: 120.0000 mg | ORAL_CAPSULE | Freq: Two times a day (BID) | ORAL | Status: DC

## 2011-12-02 NOTE — Assessment & Plan Note (Signed)
This will be treated in the context of managing her hypertension.

## 2011-12-02 NOTE — Assessment & Plan Note (Signed)
Her chest pain has some typical and atypical features. She has some cardiovascular risk factors. Given a stress testing is indicated. With her dyspnea she would not be a little walk on a treadmill so she will have Lexiscan Myoview.

## 2011-12-02 NOTE — Progress Notes (Signed)
HPI The patient presents for evaluation of shortness of breath and chest discomfort. She has a distant history of stress testing years ago. This apparently was normal. She does have a history of asthma with shortness of breath. However, she thinks this is getting progressively worse and she's having more dyspnea with exertion. She's noticed more fatigue with activities. She's also started having some chest discomfort. This is a mid chest discomfort and somewhat radiating down her right arm. She notices it with physical exertion or with emotional stress. She usually has to stop what she is doing. She is not described PND though she has chronically slept on 4 pillows. She has some mild extremity swelling. She doesn't describe associated symptoms such as nausea vomiting or diaphoresis.  Allergies  Allergen Reactions  . Ace Inhibitors     REACTION: cough  . Penicillins     Current Outpatient Prescriptions  Medication Sig Dispense Refill  . albuterol (PROVENTIL) (2.5 MG/3ML) 0.083% nebulizer solution Take 2.5 mg by nebulization 4 (four) times daily as needed.        [name] Kitchen albuterol (VENTOLIN HFA) 108 (90 BASE) MCG/ACT inhaler Inhale 2 puffs into the lungs every 4 (four) hours as needed.  1 each  1  . aspirin 81 MG tablet Take 81 mg by mouth daily.        [name] Kitchen desloratadine (CLARINEX) 5 MG tablet Take 1 tablet (5 mg total) by mouth daily.  30 tablet  11  . EPINEPHrine (EPIPEN) 0.3 mg/0.3 mL DEVI Inject 0.3 mLs (0.3 mg total) into the muscle once. Dispense pen.  Instruct in use  2 Device  1  . fish oil-omega-3 fatty acids 1000 MG capsule Take 2 g by mouth daily.      . Fluticasone-Salmeterol (ADVAIR DISKUS) 500-50 MCG/DOSE AEPB Inhale 1 puff into the lungs 2 (two) times daily.      . meloxicam (MOBIC) 7.5 MG tablet Take 7.5 mg by mouth daily.      . metFORMIN (GLUCOPHAGE) 500 MG tablet Take 1 tablet (500 mg total) by mouth 2 (two) times daily with a meal.  60 tablet  5  . nitroGLYCERIN (NITROSTAT) 0.4 MG  SL tablet Take one tablet and place under tongue if develop chest pain may repeat x2       . pantoprazole (PROTONIX) 40 MG tablet Take 1 tablet (40 mg total) by mouth daily.  30 tablet  11  . valsartan (DIOVAN) 160 MG tablet Take 1 tablet (160 mg total) by mouth daily.  30 tablet  11  . zoster vaccine live, PF, (ZOSTAVAX) 40981 UNT/0.65ML injection Inject 19,400 Units into the skin once.  1 vial  0  . cetirizine (ZYRTEC) 10 MG tablet Take 1 tablet (10 mg total) by mouth daily.  30 tablet  11  . diltiazem (CARDIZEM SR) 120 MG 12 hr capsule Take 1 capsule (120 mg total) by mouth 2 (two) times daily.  60 capsule  11    Past Medical History  Diagnosis Date  . Sleep apnea     No CPAP  . Gastritis   . Plantar fasciitis   . Diabetes mellitus   . Hyperlipidemia   . Asthma   . Urticaria     Past Surgical History  Procedure Date  . Tubal ligation   . Cholecystectomy   . Bunionectomy     Family History  Problem Relation Age of Onset  . Coronary artery disease Father     Died age 39  . Coronary artery  disease Mother     Died age 66    History   Social History  . Marital Status: Married    Spouse Name: N/A    Number of Children: 2  . Years of Education: N/A   Occupational History  .     Social History Main Topics  . Smoking status: Never Smoker   . Smokeless tobacco: Not on file  . Alcohol Use: Not on file  . Drug Use: Not on file  . Sexually Active: Not on file   Other Topics Concern  . Not on file   Social History Narrative   Lives at home with husband and the dog.    ROS:  Positive for dizziness, vertigo, cough, asthma, reflux, leg cramping and swelling, rhinitis.  Otherwise as stated in the HPI and negative for all other systems.  PHYSICAL EXAM BP 141/80  Pulse 95  Resp 18  Ht 5\' 2"  (1.575 m)  Wt 140 lb 12.8 oz (63.866 kg)  BMI 25.75 kg/m2  SpO2 99% GENERAL:  Well appearing HEENT:  Pupils equal round and reactive, fundi not visualized, oral mucosa  unremarkable NECK:  No jugular venous distention, waveform within normal limits, carotid upstroke brisk and symmetric, no bruits, no thyromegaly LYMPHATICS:  No cervical, inguinal adenopathy LUNGS:  Clear to auscultation bilaterally BACK:  No CVA tenderness CHEST:  Unremarkable HEART:  PMI not displaced or sustained,S1 and S2 within normal limits, no S3, no S4, no clicks, no rubs, no murmurs ABD:  Flat, positive bowel sounds normal in frequency in pitch, no bruits, no rebound, no guarding, no midline pulsatile mass, no hepatomegaly, no splenomegaly EXT:  2 plus pulses throughout, no edema, no cyanosis no clubbing SKIN:  No rashes no nodules NEURO:  Cranial nerves II through XII grossly intact, motor grossly intact throughout PSYCH:  Cognitively intact, oriented to person place and time  EKG:  Sinus rhythm, rate 95, axis within normal limits, intervals within normal limits, no acute ST-T wave changes.   ASSESSMENT AND PLAN

## 2011-12-02 NOTE — Assessment & Plan Note (Signed)
I have reviewed her recent labs with an LDL of 106. The goal would be less than 100 but I will defer to her primary provider.

## 2011-12-02 NOTE — Assessment & Plan Note (Signed)
Her blood pressure is elevated here but she says it's controlled at home. However, she's not tolerating atenolol which is causing worsening asthma she thinks. Therefore, I will go ahead and switch her to Cardizem 120 mg daily stopping the atenolol.

## 2011-12-02 NOTE — Patient Instructions (Addendum)
Please stop your Atenolol and start Cardizem 120 mg a day. Continue all other medications as listed  Your physician has requested that you have a lexiscan myoview. For further information please visit [email or web address] Please follow instruction sheet, as given.  Follow up will be based on these results

## 2011-12-05 ENCOUNTER — Telehealth: Payer: Self-pay | Admitting: Cardiology

## 2011-12-05 ENCOUNTER — Ambulatory Visit (HOSPITAL_COMMUNITY): Payer: Self-pay | Attending: Internal Medicine | Admitting: Radiology

## 2011-12-05 DIAGNOSIS — J45909 Unspecified asthma, uncomplicated: Secondary | ICD-10-CM | POA: Insufficient documentation

## 2011-12-05 DIAGNOSIS — Z8249 Family history of ischemic heart disease and other diseases of the circulatory system: Secondary | ICD-10-CM | POA: Insufficient documentation

## 2011-12-05 DIAGNOSIS — E119 Type 2 diabetes mellitus without complications: Secondary | ICD-10-CM | POA: Insufficient documentation

## 2011-12-05 DIAGNOSIS — R0602 Shortness of breath: Secondary | ICD-10-CM | POA: Insufficient documentation

## 2011-12-05 DIAGNOSIS — R0989 Other specified symptoms and signs involving the circulatory and respiratory systems: Secondary | ICD-10-CM | POA: Insufficient documentation

## 2011-12-05 DIAGNOSIS — R079 Chest pain, unspecified: Secondary | ICD-10-CM | POA: Insufficient documentation

## 2011-12-05 DIAGNOSIS — R000 Tachycardia, unspecified: Secondary | ICD-10-CM | POA: Insufficient documentation

## 2011-12-05 DIAGNOSIS — I10 Essential (primary) hypertension: Secondary | ICD-10-CM | POA: Insufficient documentation

## 2011-12-05 DIAGNOSIS — R002 Palpitations: Secondary | ICD-10-CM | POA: Insufficient documentation

## 2011-12-05 DIAGNOSIS — R0609 Other forms of dyspnea: Secondary | ICD-10-CM | POA: Insufficient documentation

## 2011-12-05 DIAGNOSIS — R42 Dizziness and giddiness: Secondary | ICD-10-CM | POA: Insufficient documentation

## 2011-12-05 DIAGNOSIS — E785 Hyperlipidemia, unspecified: Secondary | ICD-10-CM | POA: Insufficient documentation

## 2011-12-05 MED ORDER — REGADENOSON 0.4 MG/5ML IV SOLN
0.4000 mg | Freq: Once | INTRAVENOUS | Status: AC
  Administered 2011-12-05: 0.4 mg via INTRAVENOUS

## 2011-12-05 MED ORDER — TECHNETIUM TC 99M TETROFOSMIN IV KIT
11.0000 | PACK | Freq: Once | INTRAVENOUS | Status: AC | PRN
  Administered 2011-12-05: 11 via INTRAVENOUS

## 2011-12-05 MED ORDER — TECHNETIUM TC 99M TETROFOSMIN IV KIT
33.0000 | PACK | Freq: Once | INTRAVENOUS | Status: AC | PRN
  Administered 2011-12-05: 33 via INTRAVENOUS

## 2011-12-05 MED ORDER — DILTIAZEM HCL ER COATED BEADS 120 MG PO CP24: 120.0000 mg | ORAL_CAPSULE | Freq: Every day | ORAL | Status: DC

## 2011-12-05 NOTE — Progress Notes (Signed)
[facility] SITE 3 NUCLEAR MED [address] [place] [address] [phone]  Cardiology Nuclear Med Study  Veronica Shaw Veronica Shaw is a 61 y.o. female     MRN : [id number]     DOB: [birth year]  Procedure Date: 12/05/2011  Nuclear Med Background Indication for Stress Test:  Evaluation for Ischemia History:  Asthma and '01 MPS Ant. Thinning EF: 60%, OSA Cardiac Risk Factors: Family History - CAD, Hypertension, Lipids and NIDDM  Symptoms:  Chest Pain, Dizziness, DOE, Palpitations, Rapid HR and SOB   Nuclear Pre-Procedure Caffeine/Decaff Intake:  None NPO After: 10:00pm   Lungs:  clear O2 Sat: 96% on room air. IV 0.9% NS with Angio Cath:  22g  IV Site: L Forearm  IV Started by:  Stanton Kidney, EMT-P  Chest Size (in):  36 Cup Size: B  Height: 5\' 2"  (1.575 m)  Weight:  140 lb (63.504 kg)  BMI:  Body mass index is 25.61 kg/(m^2). Tech Comments:  CBG=165 @ 7:00am, per patient.    Nuclear Med Study 1 or 2 day study: 1 day  Stress Test Type:  Eugenie Birks  Reading MD: Dietrich Pates, MD  Order Authorizing Provider:  J.Hochrein  Resting Radionuclide: Technetium 58m Tetrofosmin  Resting Radionuclide Dose: 11.0 mCi   Stress Radionuclide:  Technetium 39m Tetrofosmin  Stress Radionuclide Dose: 32.5 mCi           Stress Protocol Rest HR: 82 Stress HR: 129  Rest BP: 133/67 Stress BP: 140/64  Exercise Time (min): n/a METS: n/a   Predicted Max HR: 158 bpm % Max HR: 81.65 bpm Rate Pressure Product: 10272   Dose of Adenosine (mg):  n/a Dose of Lexiscan: 0.4 mg  Dose of Atropine (mg): n/a Dose of Dobutamine: n/a mcg/kg/min (at max HR)  Stress Test Technologist: Milana Na, EMT-P  Nuclear Technologist:  Domenic Polite, CNMT     Rest Procedure:  Myocardial perfusion imaging was performed at rest 45 minutes following the intravenous administration of Technetium 46m Tetrofosmin. Rest ECG: NSR - Normal EKG  Stress Procedure:  The patient received IV Lexiscan 0.4 mg over 15-seconds.   Technetium 14m Tetrofosmin injected at 30-seconds.  There were no significant changes, nausea, chest feels funny, sob, and rare aberrancy beats with Lexiscan.  Quantitative spect images were obtained after a 45 minute delay. Stress ECG: No significant change from baseline ECG  QPS Raw Data Images:  Soft tissue (breast, diaphragm, bowel acitvity) surround heart. Stress Images:  Normal homogeneous uptake in all areas of the myocardium. Rest Images:  Normal homogeneous uptake in all areas of the myocardium. Subtraction (SDS):  No evidence of ischemia. Transient Ischemic Dilatation (Normal <1.22):  1.12 Lung/Heart Ratio (Normal <0.45):  0.28  Quantitative Gated Spect Images QGS EDV:  70 ml QGS ESV:  30 ml  Impression Exercise Capacity:  Lexiscan with no exercise. BP Response:  Normal blood pressure response. Clinical Symptoms:  No chest pain. ECG Impression:  No significant ST segment change suggestive of ischemia. Comparison with Prior Nuclear Study: No significant change  Overall Impression:  Normal stress nuclear study.  LV Ejection Fraction: 58%.  LV Wall Motion:  NL LV Function; NL Wall Motion  Dietrich Pates

## 2011-12-05 NOTE — Telephone Encounter (Signed)
New msg Pt is confused on how to take diltiazem.  Once per on twice per day.Please call

## 2011-12-05 NOTE — Telephone Encounter (Signed)
Diltiazem should be 120 mg once a day. New RX with correct dosing called into pharmacy. Pt aware

## 2011-12-06 ENCOUNTER — Telehealth: Payer: Self-pay | Admitting: Cardiology

## 2011-12-06 NOTE — Telephone Encounter (Signed)
New msg [facility] pharmacy called to clarify rx for diltiazem. Please call back

## 2011-12-06 NOTE — Telephone Encounter (Signed)
Diltiazem  CD is 120 mg once a day as documented and called into pharmacy yesterday

## 2011-12-09 ENCOUNTER — Encounter: Payer: Self-pay | Admitting: Family Medicine

## 2011-12-09 ENCOUNTER — Ambulatory Visit (INDEPENDENT_AMBULATORY_CARE_PROVIDER_SITE_OTHER): Payer: Self-pay | Admitting: Family Medicine

## 2011-12-09 VITALS — BP 124/70

## 2011-12-09 VITALS — BP 136/71 | HR 83 | Temp 98.6°F | Ht 62.0 in | Wt 139.0 lb

## 2011-12-09 DIAGNOSIS — E1142 Type 2 diabetes mellitus with diabetic polyneuropathy: Secondary | ICD-10-CM | POA: Insufficient documentation

## 2011-12-09 DIAGNOSIS — E785 Hyperlipidemia, unspecified: Secondary | ICD-10-CM | POA: Insufficient documentation

## 2011-12-09 DIAGNOSIS — E1149 Type 2 diabetes mellitus with other diabetic neurological complication: Secondary | ICD-10-CM

## 2011-12-09 DIAGNOSIS — G4730 Sleep apnea, unspecified: Secondary | ICD-10-CM

## 2011-12-09 DIAGNOSIS — G609 Hereditary and idiopathic neuropathy, unspecified: Secondary | ICD-10-CM

## 2011-12-09 HISTORY — DX: Type 2 diabetes mellitus with diabetic polyneuropathy: E11.42

## 2011-12-09 MED ORDER — PRAVASTATIN SODIUM 20 MG PO TABS: 20.0000 mg | ORAL_TABLET | Freq: Every day | ORAL | Status: DC

## 2011-12-09 MED ORDER — GABAPENTIN 100 MG PO CAPS: ORAL_CAPSULE | ORAL | Status: DC

## 2011-12-09 MED ORDER — ALBUTEROL SULFATE (2.5 MG/3ML) 0.083% IN NEBU: 2.5000 mg | INHALATION_SOLUTION | Freq: Four times a day (QID) | RESPIRATORY_TRACT | Status: DC | PRN

## 2011-12-09 NOTE — Patient Instructions (Signed)
Diabetic Neuropathy Diabetic neuropathy is a common complication caused by diabetes. Neuropathy is a term that means nerve disease or damage. If your diabetes is uncontrolled and you have high blood glucose (sugar) levels, over time, this can lead to damage to nerves throughout your body. There are three types of diabetic neuropathy:   Peripheral.   Autonomic.   Focal.  PERIPHERAL NEUROPATHY Peripheral neuropathy is the most common form of diabetic neuropathy. It causes damage to the nerves of the feet and legs and eventually the hands and arms.  SYMPTOMS  Peripheral neuropathy occurs slowly over time. The peripheral nerves sense touch, hot and cold, and pain. When these nerves no longer work:   Your feet become numb.   You can no longer feel pressure or pain in your feet.   You may have burning, stabbing or aching pain.  This can lead to:  Thick calluses over pressure areas.   Pressure sores.   Ulcers. Ulcers can become infected with germs (bacteria) and can even lead to infection in the bones of the feet.  DIAGNOSIS  The diagnosis of diabetic neuropathy is difficult at best. Sensory function testing can be done with:  Light touch using a monofilament.   Vibration with tuning fork.   Sharp sensation with pin prick  Other tests that can help diagnose neuropathy are:  Nerve Conduction Velocities (NCV). This checks the transmission of electrical current through a nerve.   Electromyography (EMG). This shows how muscles respond to electrical signals transmitted by nearby nerves.   Quantitative sensory testing, which is used to assess how your nerves respond to vibration and changes in temperature.  AUTONOMIC NEUROPATHY The autonomic nervous system controls functions that you do not think about. Examples would be:   Heart beat.   Regulation of body temperature.   Blood pressure.   Urination.   Digestion.   Sweating.   Sexual function.  SYMPTOMS  The symptoms of  autonomic neuropathy vary depending on which nerves are affected.   There can be problems with digestion such as:   Feeling sick to your stomach (nausea).   Vomiting.   Bloating.   Constipation.   Diarrhea.   Abdominal pain.   Difficulty with urination may occur because of the inability to sense when your bladder is full. You may have urine leakage (incontinence) or inability to empty your bladder completely (retention).   Palpitations or a feeling of an abnormal heart beat.   Blood pressure drops on arising (orthostatic hypotension). This can happen when you first sit up or stand up. It causes you to feel:   Dizzy.   Weak.   Faint.   Sexual functioning:   In men, inability to attain and maintain an erection.   In women, vaginal dryness and problems with decreased sexual desire and arousal.  DIAGNOSIS  Diagnosis is often based on reported symptoms. Tell your medical caregiver if you experience:   Dizziness.   Constipation.   Diarrhea.   Inappropriate urination or inability to urinate.   Inability to get or maintain an erection.  Tests that may be done include:  An EKG or Holter Monitor. These are tests that can help show problems with the heart rate or heart rhythm.   X-rays can be used to find if there are problems with your ability to properly empty food from your stomach into the small intestine after eating.  FOCAL NEUROPATHY Focal neuropathy affects just one nerve tract and occurs suddenly. However, it usually improves by itself   over time. It does not cause long term damage, and treatments are usually needed only until the problem improves. SYMPTOMS  Examples include:   Abnormal eye movements or abnormal alignment of both eyes.   Weakness in the wrist.   Foot drop, which results in inability to lift the foot properly. This causes abnormal walking or foot movement.  DIAGNOSIS  Diagnosis is made based on your symptoms and what your caregiver finds on  your exam. Other tests that may be done include:  Nerve Conduction Velocities (NCV). This checks the transmission of electrical current through a nerve.   Electromyography (EMG). This shows how muscles respond to electrical signals transmitted by nearby nerves.   Quantitative sensory testing, which is used to assess how your nerves respond to vibration and changes in temperature.  TREATMENT Once nerve damage occurs it cannot be reversed. The goal of treatment is to keep the disease from getting worse. If it gets worse, it will affect more nerve fibers. Controlling your blood (sugar) is the key. You will need to keep your blood glucose and A1c at the target range prescribed by your caregiver. Things that will help control blood glucose levels include:  Blood glucose monitoring.   Meal planning.   Physical activity.   Diabetes medication.  Over time, maintaining lower blood glucose levels helps lessen symptoms. Sometimes, prescription pain medicine is needed. Focal neuropathy can be painful and unpredictable and occurs most often in older adults with diabetes.  SEEK MEDICAL CARE IF:   You develop peripheral nerve symptoms such as burning, numbness, or pain in your feet, legs or hands.   You develop autonomic nerve symptoms such as:   Dizziness.   Abnormal urinary control.   Inability to get an erection.   You develop focal nerve symptoms such as sudden abnormal eye movements or sudden foot drop.  Document Released: 09/26/2001 Document Revised: 07/07/2011 Document Reviewed: 12/26/2008 [employer] Patient Information 2012 [employer]. 

## 2011-12-09 NOTE — Assessment & Plan Note (Signed)
Gabapentin up-taper. RTC 1 month.

## 2011-12-09 NOTE — Progress Notes (Signed)
  Subjective:    Patient ID: Veronica Shaw, female    DOB: 1951-07-14, 61 y.o.   MRN: [id number]  HPI [name] comes in with a long history of pain that she describes as burning, numbness, and tingling over the bottom of her right foot. She carries a diagnosis of sciatica. On further review, she notes pain and burning also in the bottom of her left foot, as well as both hands. This is significantly worse at night.  She is a diabetic, under good control.  Past medical history, surgical history, family history, social history, allergies, and medications reviewed from the medical record and no changes needed.  Review of Systems    No fevers, chills, night sweats, weight loss, chest pain, or shortness of breath.  Social History: Non-smoker. Objective:   Physical Exam General:  Well developed, obese and in no acute distress. Neuro:  Alert and oriented x3, extra-ocular muscles intact. Skin: Warm and dry, no rashes noted. Respiratory:  Not using accessory muscles, speaking in full sentences. Musculoskeletal: Back Exam: Inspection: Unremarkable Motion: Flexion 45 deg, Extension 45 deg, Side Bending to 45 deg bilaterally,  Rotation to 45 deg bilaterally SLR laying:  Negative XSLR laying: Negative Palpable tenderness: None FABER: negative  Strength at foot Plantar-flexion: 5/5    Dorsi-flexion: 5/5    Eversion: 5/5   Inversion: 5/5 Leg strength Quad: 5/5   Hamstring: 5/5   Hip flexor: 5/5   Hip abductors: 5/5 Gait unremarkable.  Hands and feet appear normal. Sensation is grossly intact with the exception of the palms insoles. Deep tendon reflexes are 2+ and symmetric in the patellar, biceps, triceps, brachial radialis, they are 1+ in her Achilles.  No ulcers, or abnormal callus on her feet.    Assessment & Plan:

## 2011-12-09 NOTE — Progress Notes (Signed)
  Subjective:    Patient ID: Veronica Shaw, female    DOB: August 15, 1950, 61 y.o.   MRN: [id number]  HPI Open in error   Review of Systems     Objective:   Physical Exam        Assessment & Plan:

## 2011-12-09 NOTE — Patient Instructions (Signed)
Today we talked about:  Continue diovan Start pravachol---call if bothers you or if you want to refill at 90 days supply Sleep study appt will call you  Then we get you a CPAP

## 2011-12-14 ENCOUNTER — Other Ambulatory Visit: Payer: Self-pay | Admitting: Family Medicine

## 2011-12-14 MED ORDER — ALBUTEROL SULFATE (2.5 MG/3ML) 0.083% IN NEBU: 2.5000 mg | INHALATION_SOLUTION | Freq: Four times a day (QID) | RESPIRATORY_TRACT | Status: DC | PRN

## 2011-12-19 ENCOUNTER — Institutional Professional Consult (permissible substitution): Payer: Self-pay | Admitting: Cardiology

## 2012-01-01 ENCOUNTER — Ambulatory Visit (HOSPITAL_BASED_OUTPATIENT_CLINIC_OR_DEPARTMENT_OTHER): Payer: Self-pay | Attending: Family Medicine | Admitting: General Practice

## 2012-01-01 VITALS — Ht 62.0 in | Wt 140.0 lb

## 2012-01-01 DIAGNOSIS — G4733 Obstructive sleep apnea (adult) (pediatric): Secondary | ICD-10-CM

## 2012-01-01 DIAGNOSIS — G4700 Insomnia, unspecified: Secondary | ICD-10-CM | POA: Insufficient documentation

## 2012-01-01 DIAGNOSIS — G4730 Sleep apnea, unspecified: Secondary | ICD-10-CM

## 2012-01-08 DIAGNOSIS — G4730 Sleep apnea, unspecified: Secondary | ICD-10-CM

## 2012-01-08 DIAGNOSIS — G4700 Insomnia, unspecified: Secondary | ICD-10-CM

## 2012-01-09 NOTE — Procedures (Signed)
NAMEQUINNIE, BARCELO                 ACCOUNT NO.:  [id number]  MEDICAL RECORD NO.:  [id number]          PATIENT TYPE:  OUT  LOCATION:  SLEEP CENTER                 FACILITY:  [facility]  PHYSICIAN:  Shanicka Oldenkamp D. Maple Hudson, MD, FCCP, FACPDATE OF BIRTH:  Aug 18[birth year] 1948  DATE OF STUDY:  01/01/2012                           NOCTURNAL POLYSOMNOGRAM  REFERRING PHYSICIAN:  Wayne A. Sheffield Slider, M.D.  REFERRING PHYSICIAN:  Wayne A. Sheffield Slider, M.D.  INDICATION FOR STUDY:  Insomnia with sleep apnea.  EPWORTH SLEEPINESS SCORE:  10/24.  BMI:  25.6, weight 140 pounds, height 62 inches, neck 13.7 inches.  HOME MEDICATIONS:  Charted and reviewed.  SLEEP ARCHITECTURE:  Total sleep time 246.5 minutes with sleep efficiency 59.3%.  Stage I was 8.7%, stage II 74.4%.  Stage III absent, REM 16.8% of total sleep time.  Sleep latency 14 minutes, REM latency 56 minutes, awake after sleep onset 157.5 minutes, arousal index 15.4.  BEDTIME MEDICATION:  Metformin.  She was spontaneously awake between 2 a.m. and 3:30 a.m.  RESPIRATORY DATA:  Apnea-hypopnea index (AHI) 1.7 per hour.  A total of 7 events were scored, all was hypopneas associated with nonsupine sleep position and REM.  REM AHI 5.8 per hour. There were not enough events to permit application of split protocol, CPAP titration on the study.  OXYGEN DATA:  Moderate snoring with oxygen desaturation to a nadir of 85% and mean oxygen saturation through the study of 89% on room air. During the total recording time, a total of 216.5 minutes was recorded with room air saturation less than 90%.  8.6 minutes were recorded with saturation less than 88% on room air.  CARDIAC DATA:  Sinus rhythm.  MOVEMENT/PARASOMNIA:  No significant movement disturbance.  Bathroom x2.  IMPRESSION/RECOMMENDATION: 1. Occasional respiratory event with sleep disturbance, within normal     limits.  AHI 1.7 per hour (the normal range for adults is from 0-5     events per hour).  Moderate snoring  with oxygen desaturation to a     nadir of 85% and mean saturation through the study of 89% on room     air. 2. On arrival, room air oxygen saturation was 89%.  During the study,     she desaturated to a nadir of 85% with a mean saturation through     the study of 89% on room air.  Through the total recording time,     which includes some limited time awake, a total of 216.5 minutes     were recorded with room air saturation less than 90% and 8.6     minutes recorded with saturation less than 88%.  Consider     evaluation for home oxygen.     Heath Badon D. Maple Hudson, MD, [facility], FACP Diplomate, American Board of Sleep Medicine    CDY/MEDQ  D:  01/08/2012 14:09:20  T:  01/08/2012 21:32:35  Job:  [id number]

## 2012-01-16 ENCOUNTER — Telehealth: Payer: Self-pay | Admitting: Family Medicine

## 2012-01-16 NOTE — Telephone Encounter (Signed)
Patient is calling wanting to speak to the nurse about the results for Sleep Apnea.

## 2012-01-18 NOTE — Telephone Encounter (Signed)
Patient did not need CPAP and does not have sleep apnea. However, during the study her oxygen level was low. This has not been a case when she has been in our office. If she is currently having an asthma exacerbation, she should come in and see [place]. If she feels fine, she should come back and see [place] as needed.

## 2012-01-20 ENCOUNTER — Other Ambulatory Visit: Payer: Self-pay

## 2012-01-20 MED ORDER — NITROGLYCERIN 0.4 MG SL SUBL: 0.4000 mg | SUBLINGUAL_TABLET | SUBLINGUAL | Status: DC | PRN

## 2012-01-20 NOTE — Telephone Encounter (Signed)
..   Requested Prescriptions   Signed Prescriptions Disp Refills  . nitroGLYCERIN (NITROSTAT) 0.4 MG SL tablet 25 tablet 6    Sig: Place 1 tablet (0.4 mg total) under the tongue every 5 (five) minutes as needed for chest pain. Take one tablet and place under tongue if develop chest pain may repeat x2    Authorizing Provider: Rollene Rotunda    Ordering User: Christella Hartigan, Katrianna Friesenhahn Judie Petit

## 2012-01-20 NOTE — Telephone Encounter (Signed)
..   Requested Prescriptions   Signed Prescriptions Disp Refills  . nitroGLYCERIN (NITROSTAT) 0.4 MG SL tablet 25 tablet 6    Sig: Place 1 tablet (0.4 mg total) under the tongue every 5 (five) minutes as needed for chest pain. Take one tablet and place under tongue if develop chest pain may repeat x2    Authorizing Provider: Rollene Rotunda    Ordering User: Axl Rodino M  refill, the first one did not go through. It printed out

## 2012-01-23 ENCOUNTER — Other Ambulatory Visit: Payer: Self-pay | Admitting: *Deleted

## 2012-01-23 MED ORDER — METFORMIN HCL 500 MG PO TABS: 500.0000 mg | ORAL_TABLET | Freq: Two times a day (BID) | ORAL | Status: DC

## 2012-01-25 ENCOUNTER — Encounter: Payer: Self-pay | Admitting: Family Medicine

## 2012-01-25 ENCOUNTER — Ambulatory Visit (INDEPENDENT_AMBULATORY_CARE_PROVIDER_SITE_OTHER): Payer: Self-pay | Admitting: Family Medicine

## 2012-01-25 VITALS — BP 153/71 | HR 89 | Temp 98.0°F | Ht 62.0 in | Wt 139.0 lb

## 2012-01-25 DIAGNOSIS — E119 Type 2 diabetes mellitus without complications: Secondary | ICD-10-CM

## 2012-01-25 DIAGNOSIS — J45909 Unspecified asthma, uncomplicated: Secondary | ICD-10-CM

## 2012-01-25 DIAGNOSIS — G4730 Sleep apnea, unspecified: Secondary | ICD-10-CM

## 2012-01-25 DIAGNOSIS — E1149 Type 2 diabetes mellitus with other diabetic neurological complication: Secondary | ICD-10-CM

## 2012-01-25 DIAGNOSIS — G609 Hereditary and idiopathic neuropathy, unspecified: Secondary | ICD-10-CM

## 2012-01-25 DIAGNOSIS — E1142 Type 2 diabetes mellitus with diabetic polyneuropathy: Secondary | ICD-10-CM

## 2012-01-25 LAB — POCT GLYCOSYLATED HEMOGLOBIN (HGB A1C): Hemoglobin A1C: 6.9

## 2012-01-25 MED ORDER — PANTOPRAZOLE SODIUM 40 MG PO TBEC: 40.0000 mg | DELAYED_RELEASE_TABLET | Freq: Every day | ORAL | Status: DC

## 2012-01-25 MED ORDER — VALSARTAN 160 MG PO TABS: 160.0000 mg | ORAL_TABLET | Freq: Every day | ORAL | Status: DC

## 2012-01-25 MED ORDER — GABAPENTIN 100 MG PO CAPS: ORAL_CAPSULE | ORAL | Status: DC

## 2012-01-25 MED ORDER — DESLORATADINE 5 MG PO TABS: 5.0000 mg | ORAL_TABLET | Freq: Every day | ORAL | Status: DC

## 2012-01-25 MED ORDER — METFORMIN HCL 500 MG PO TABS: 500.0000 mg | ORAL_TABLET | Freq: Two times a day (BID) | ORAL | Status: DC

## 2012-01-25 MED ORDER — BECLOMETHASONE DIPROPIONATE 80 MCG/ACT IN AERS: 2.0000 | INHALATION_SPRAY | Freq: Two times a day (BID) | RESPIRATORY_TRACT | Status: DC

## 2012-01-25 NOTE — Progress Notes (Signed)
  Subjective:    Patient ID: Veronica Shaw, female    DOB: 04-Jan-1951, 61 y.o.   MRN: [id number]  HPI Patient here to followup her sleep study. She did not meet criteria to have a CPAP machine placed. However, she states that her breathing always gets worse at night and she thinks she needs one. She has had asthma for her entire life in both of her parents smoked 2 packs per day. She thinks that she needs oxygen because her mother needed oxygen for the last 10 years of her life. She says that she takes her Advair most mornings but has not taken it at night because it makes her too jittery. She does not connect this to her breathing worsen it. She says that her nighttime breathing shortness of breath and not wheezing.  Nonsmoker Review of Systems Denies CP,  HA, N/V/D, fever     Objective:   Physical Exam  Vital signs reviewed General appearance - alert, well appearing, and in no distress Chest - clear to auscultation, no wheezes, rales or rhonchi, symmetric air entry, no tachypnea, retractions or cyanosis Heart - normal rate, regular rhythm, normal S1, S2, no murmurs, rubs, clicks or gallops O2 sat checked while walking around clinic. Patient was between 98 and 96.      Assessment & Plan:

## 2012-01-25 NOTE — Assessment & Plan Note (Signed)
Did not qualify for CPAP at sleep study. However, she believes that she still needs one. She says that she decrease much worse at night, she is restless at night, and her husband has moved out of the room to get rest. Given her sleep study results, I think this may be more related to her asthma. I will send her to Dr. Raymondo Band for PFTs. See asthma problem

## 2012-01-25 NOTE — Assessment & Plan Note (Signed)
Patient feels that her asthma is worse at night, which makes sense and she is not taking her Advair at night. I have switched her to high-dose Qvar to avoid the LABA for now. She will go to have PFTs done as well.

## 2012-01-25 NOTE — Patient Instructions (Addendum)
Please make an appointment for Veronica Shaw with Dr. Raymondo Shaw On the day of your appointment, please do not take the Qvar  Today we've stopped Advair because it makes you jittery and you're not taking it Start Qvar, 2 puffs twice a day Take this every day whether you feel like you need it or not

## 2012-02-13 ENCOUNTER — Encounter: Payer: Self-pay | Admitting: Pharmacist

## 2012-02-13 ENCOUNTER — Ambulatory Visit (INDEPENDENT_AMBULATORY_CARE_PROVIDER_SITE_OTHER): Payer: Self-pay | Admitting: Pharmacist

## 2012-02-13 VITALS — BP 141/79 | HR 88 | Ht 62.5 in | Wt 140.0 lb

## 2012-02-13 DIAGNOSIS — K2990 Gastroduodenitis, unspecified, without bleeding: Secondary | ICD-10-CM

## 2012-02-13 DIAGNOSIS — J45909 Unspecified asthma, uncomplicated: Secondary | ICD-10-CM

## 2012-02-13 DIAGNOSIS — K2970 Gastritis, unspecified, without bleeding: Secondary | ICD-10-CM

## 2012-02-13 NOTE — Assessment & Plan Note (Signed)
Spirometry evaluation reveals Moderate restrictive lung disease with SIGNIFCANT improvement following Beta Agonist.   Patient has been experiencing shortness of breath with exertion AND nocturnal symptoms of air hunger/gasping/ choking.  She has been taking albuterol AND Advair (contains salmeterol and fluticasone) however she does NOT like the tachycardia symptoms which she experiences with Beta Agonists.  We will continue plan to switch to QVAR in place of Advair (when it comes from Medication Assistance Program (MAP)).  Continue treatment plan at this time.    Patient will continue taking PROTONIX BID to assess if this helps her reflux.   She will also take her QVAR twice daily (unlike her Advair which was once daily due to severe symptoms of tachycarida). Reviewed results of pulmonary function tests. Consider in the future - a trial of Singulair (10mg once daily) to see if patient is sensitive to leukotriene.  Alternative option would be for a trial of Spiriva as this may be similar in benefit to LABA while avoiding tachycardia which appears to be limiting. Pt verbalized understanding of results.  Written pt instructions provided.  F/U Clinic visit with Dr. Thekkekandam, [facility].   Total time in face to face counseling 45 minutes.  Patient seen with Lauren Bradford, Med Student. 

## 2012-02-13 NOTE — Assessment & Plan Note (Signed)
Spirometry evaluation reveals Moderate restrictive lung disease with SIGNIFCANT improvement following Beta Agonist.   Patient has been experiencing shortness of breath with exertion AND nocturnal symptoms of air hunger/gasping/ choking.  She has been taking albuterol AND Advair (contains salmeterol and fluticasone) however she does NOT like the tachycardia symptoms which she experiences with Beta Agonists.  We will continue plan to switch to QVAR in place of Advair (when it comes from Medication Assistance Program (MAP)).  Continue treatment plan at this time.    Patient will continue taking PROTONIX BID to assess if this helps her reflux.   She will also take her QVAR twice daily (unlike her Advair which was once daily due to severe symptoms of tachycarida). Reviewed results of pulmonary function tests. Consider in the future - a trial of Singulair (10mg  once daily) to see if patient is sensitive to leukotriene.  Alternative option would be for a trial of Spiriva as this may be similar in benefit to LABA while avoiding tachycardia which appears to be limiting. Pt verbalized understanding of results.  Written pt instructions provided.  F/U Clinic visit with Dr. Benjamin Stain, [facility].   Total time in face to face counseling 45 minutes.  Patient seen with Marissa Nestle, Med Student.

## 2012-02-13 NOTE — Progress Notes (Signed)
  Subjective:    Patient ID: Veronica Shaw, female    DOB: [birth year], 61 y.o.   MRN: [id number]  HPI  Patient with history of Asthma since age [age] presents for PFT evaluation.   She reports exposure to tobacco as a youth with both parents smoking in a small house.   She has symptoms of esophagitis/severe reflux which is worst at night.  She does NOT like the way albuterol makes her feel.   However, she knows that it helps her breath and uses albuterol prior to exertion.   Patient explained that she had a severe "peumonia" about 5 years ago that she believes left her with lung scaring.    Review of Systems     Objective:   Physical Exam  See Documentation Flowsheet (discrete results - PFTs) for complete Spirometry results. Patient provided good effort while attempting spirometry.    Albuterol Neb  Lot# [id number]     Exp. 09/2013        Assessment & Plan:  Spirometry evaluation reveals Moderate restrictive lung disease with SIGNIFCANT improvement following Beta Agonist.   Patient has been experiencing shortness of breath with exertion AND nocturnal symptoms of air hunger/gasping/ choking.  She has been taking albuterol AND Advair (contains salmeterol and fluticasone) however she does NOT like the tachycardia symptoms which she experiences with Beta Agonists.  We will continue plan to switch to QVAR in place of Advair (when it comes from Medication Assistance Program (MAP)).  Continue treatment plan at this time.    Patient will continue taking PROTONIX BID to assess if this helps her reflux.   She will also take her QVAR twice daily (unlike her Advair which was once daily due to severe symptoms of tachycarida). Reviewed results of pulmonary function tests. Consider in the future - a trial of Singulair (10mg  once daily) to see if patient is sensitive to leukotriene.  Alternative option would be for a trial of Spiriva as this may be similar in benefit to LABA while avoiding tachycardia which  appears to be limiting. Pt verbalized understanding of results.  Written pt instructions provided.  F/U Clinic visit with Dr. Benjamin Stain, [facility].   Total time in face to face counseling 45 minutes.  Patient seen with Marissa Nestle, Med Student.

## 2012-02-13 NOTE — Patient Instructions (Addendum)
Continue using Albuterol as needed for shortness of breath or symptoms.  STOP Advair and START QVAR when you receive it from MAP program.   Remember the dose is 2 puffs TWICE daily.  Next visit with Dr. Benjamin Stain.

## 2012-02-13 NOTE — Progress Notes (Signed)
Patient ID: Veronica Shaw, female   DOB: 1950-12-02, 61 y.o.   MRN: [id number] Reviewed and agree with Dr. Macky Lower management and documentation.

## 2012-03-02 ENCOUNTER — Encounter: Payer: Self-pay | Admitting: Family Medicine

## 2012-03-02 ENCOUNTER — Ambulatory Visit (INDEPENDENT_AMBULATORY_CARE_PROVIDER_SITE_OTHER): Payer: Self-pay | Admitting: Family Medicine

## 2012-03-02 VITALS — BP 145/75 | HR 80 | Ht 62.5 in | Wt 140.6 lb

## 2012-03-02 DIAGNOSIS — K2990 Gastroduodenitis, unspecified, without bleeding: Secondary | ICD-10-CM

## 2012-03-02 DIAGNOSIS — J45909 Unspecified asthma, uncomplicated: Secondary | ICD-10-CM

## 2012-03-02 DIAGNOSIS — K2970 Gastritis, unspecified, without bleeding: Secondary | ICD-10-CM

## 2012-03-02 MED ORDER — MONTELUKAST SODIUM 10 MG PO TABS: 10.0000 mg | ORAL_TABLET | Freq: Every day | ORAL | Status: DC

## 2012-03-02 MED ORDER — PANTOPRAZOLE SODIUM 40 MG PO TBEC: 40.0000 mg | DELAYED_RELEASE_TABLET | Freq: Two times a day (BID) | ORAL | Status: DC

## 2012-03-02 NOTE — Progress Notes (Signed)
Subjective:     Patient ID: Veronica Shaw, female   DOB: October 13, 1950, 61 y.o.   MRN: [id number]  HPI pt is a 61 year old with a h/o DM, HTN, dyslipidemia, gastritis, and asthma here for a follow-up after PFTs with Dr. Raymondo Band.  PFTs showed moderate restrictive lung disease with significant improvement with beta agonist.  Pt continues having significant difficulty breathing especially at night, when she reports using 2 pillows because otherwise she feels acid from her GERD creeping up her throat which makes it difficult for her to breathe and sleep.  She also has SOB and CP when climbing stairs or exerting herself.  She is using albuterol 4-5x/day.  Of note, May 2013 nuclear stress test was normal with LVEF of 58%.  Pt takes albuterol and qvar, swiched recently from albuterol and advair.  She does not believe the QVAR helps much.  Also reports history of anxiety/depression, with some anxiety symptoms when she has any difficulty breathing at night.  Also complains of significant GERD symptoms, for which patient takes Protonix 40 mg daily and is currently taking 40 mg BID on the suggestion of a family member who is a Engineer, civil (consulting).  This is not helping and she still spits up acidic fluid when she lies down.  Sleep apnea test recently showed no sleep apnea.  Review of Systems -Per HPI; otherwise, negative.     Objective:   Physical Exam BP 145/75  Pulse 80  Ht 5' 2.5" (1.588 m)  Wt 140 lb 9.6 oz (63.776 kg)  BMI 25.31 kg/m2 Gen: Well-appearing woman sitting in room in NAD CV: RRR, no murmurs, rubs, gallops Pulm: CTAB with no crackles; decreased breath sounds heard throughout, RLL with mild wheezing, no increased WOB though occasional pursing of lips Extr: No lower extremity edema bilaterally    Assessment:     61 year old with a h/o DM, HTN, dyslipidemia, gastritis, and asthma here for a follow-up after PFTs.    Plan:

## 2012-03-02 NOTE — Patient Instructions (Addendum)
It was good to meet you today.  - Please continue taking your medications.  We are adding singulair which you will take daily.  Please make sure to take this daily, along with your QVAR.  If you are still using your albuterol >2-3 times per week, call and let us know so you can be seen. - Otherwise, please return for follow-up in 2-3 weeks to check up on your breathing with the new medication added. - I am prescribing your protonix 40 mg twice a day.  This is not a dose we want to continue long-term, so please re-address this at your follow-up visit. - At this time, you did not have sleep apnea and therefore did not qualify for needing the CPAP machine. - We can re-evaluate if you need home oxygen at your follow-up appointment. - If you have worsening difficulty breathing, feel like you cannot breathe, or have worsening chest pain, dizziness, or pass out, please call [phone] and go to the Emergency Room.  Thank you and have a great day!  Asthma, Adult Asthma is caused by narrowing of the air passages in the lungs. It may be triggered by pollen, dust, animal dander, molds, some foods, respiratory infections, exposure to smoke, exercise, emotional stress or other allergens (things that cause allergic reactions or allergies). Repeat attacks are common. HOME CARE INSTRUCTIONS    Use prescription medications as ordered by your caregiver.   Avoid pollen, dust, animal dander, molds, smoke and other things that cause attacks at home and at work.   You may have fewer attacks if you decrease dust in your home. Electrostatic air cleaners may help.   It may help to replace your pillows or mattress with materials less likely to cause allergies.   Talk to your caregiver about an action plan for managing asthma attacks at home, including, the use of a peak flow meter which measures the severity of your asthma attack. An action plan can help minimize or stop the attack without having to seek medical care.    If you are not on a fluid restriction, drink 8 to 10 glasses of water each day.   Always have a plan prepared for seeking medical attention, including, calling your physician, accessing local emergency care, and calling 911 (in the [place] for a severe attack.   Discuss possible exercise routines with your caregiver.   If animal dander is the cause of asthma, you may need to get rid of pets.  SEEK MEDICAL CARE IF:    You have wheezing and shortness of breath even if taking medicine to prevent attacks.   You have muscle aches, chest pain or thickening of sputum.   Your sputum changes from clear or white to yellow, green, gray, or bloody.   You have any problems that may be related to the medicine you are taking (such as a rash, itching, swelling or trouble breathing).  SEEK IMMEDIATE MEDICAL CARE IF:    Your usual medicines do not stop your wheezing or there is increased coughing and/or shortness of breath.   You have increased difficulty breathing.   You have a fever.  MAKE SURE YOU:    Understand these instructions.   Will watch your condition.   Will get help right away if you are not doing well or get worse.  Document Released: 07/18/2005 Document Revised: 07/07/2011 Document Reviewed: 03/05/2008 [facility] Patient Information 2012 [place], [place].

## 2012-03-02 NOTE — Assessment & Plan Note (Addendum)
Pt with continued difficulty breathing at night and overuse of albuterol 4-5 times / week.  Also on QVAR BID.  Given negative nuclear stress test in May 2013 and no crackles on pulm exam or extremity edema, less concerned for cardiac etiology. - Continue QVAR and albuterol; added singulair 10 mg daily for better control of asthma (printed so patient can fill using MAP); consider increasing QVAR at following visit. - Pt informed to call if using albuterol >2-3 times / week - Otherwise, return to clinic for f/u in 2-4 weeks - At that time, can evaluate for home O2 as patient was requesting this, though she really does not qualify for needing it as O2 sat only dropped <88% for 8 minutes during sleep apnea test; could consider walking O2 sats in clinic to evaluate. - Pt does not qualify for CPAP machine as she does not have sleep apnea, according to sleep study AHI 1.7 (normal up to 5) - Consider evaluating anxiety/depression, as pt has h/o of this in past (though with family member deaths) and anxiety could be contributing to breathing complaints.

## 2012-03-02 NOTE — Assessment & Plan Note (Signed)
Patient reports continued symptoms of GERD/gastritis and is taking protonix 40 mg BID daily. - Though not a customary dose except for extreme gastritis such as Zollinger-Ellison syndrome, prescribing Protonix 40mg  BID x 1 month (printed so can use with MAP) so pt can continue taking this dose to see if it helps.   - Re-evaluate at f/u visit - pt informed to f/u in 2-4 weeks.

## 2012-03-14 ENCOUNTER — Telehealth: Payer: Self-pay | Admitting: Family Medicine

## 2012-03-14 NOTE — Telephone Encounter (Signed)
Patient called back and reported that the QVAR she was started on in June is irritating her throat and giving her a headache.   Because she is not taking the Singulair and having problems with the QVAR, her asthma is worse.   Told her I would speak with Dr. Birdie Sons and call her back.

## 2012-03-14 NOTE — Telephone Encounter (Signed)
Called patient to see if she was using her inhalers.  Did not get an answer.  Left a message to call us back.  Will route note to Dr. Birdie Sons to see if there is a less expensive alternative to Singuliar.

## 2012-03-14 NOTE — Telephone Encounter (Signed)
I could not find a cheaper alternative to singulair, though I have not seen this patient before so can't comment on her current asthma status and wouldn't want to prescribe any medication with out assessing her. She should probably make an appointment as soon as possible in clinic to work out these medication issues

## 2012-03-14 NOTE — Telephone Encounter (Signed)
States that the Singulair is too expensive and is having trouble with her asthma -MAP doesn't cover this.  [facility] [place]

## 2012-03-14 NOTE — Telephone Encounter (Signed)
Will call and set her up to be seen.   Gave her a [place] appointment for tomorrow.

## 2012-03-15 ENCOUNTER — Encounter: Payer: Self-pay | Admitting: Family Medicine

## 2012-03-15 ENCOUNTER — Ambulatory Visit (INDEPENDENT_AMBULATORY_CARE_PROVIDER_SITE_OTHER): Payer: Self-pay | Admitting: Family Medicine

## 2012-03-15 VITALS — BP 160/73 | HR 93 | Temp 99.0°F | Ht 62.5 in | Wt 138.5 lb

## 2012-03-15 DIAGNOSIS — J45909 Unspecified asthma, uncomplicated: Secondary | ICD-10-CM

## 2012-03-15 MED ORDER — TIOTROPIUM BROMIDE MONOHYDRATE 18 MCG IN CAPS: 18.0000 ug | ORAL_CAPSULE | Freq: Every day | RESPIRATORY_TRACT | Status: DC

## 2012-03-15 MED ORDER — BENZONATATE 100 MG PO CAPS: 100.0000 mg | ORAL_CAPSULE | Freq: Two times a day (BID) | ORAL | Status: AC | PRN

## 2012-03-15 NOTE — Assessment & Plan Note (Signed)
Patient continues to use Albuterol 2-3 times per day for SOB despite increasing Qvar to 80. Discussed with Pharmacy resident and per Dr. Macky Lower previous records, plan to add Spiriva daily. Pharmacy resident provided [facility] teaching today.  I appreciate his help. Follow up with PCP in 1-2 weeks.

## 2012-03-15 NOTE — Patient Instructions (Addendum)
It was nice to meet you today, Veronica Shaw Please start taking Spiriva in addition to Qvar and Albuterol. Schedule follow up appointment with PCP in 1-2 weeks. IF you develop worsening symptoms, difficulty breathing, or chest pain, please return to clinic or report to ED.  Asthma Prevention Cigarette smoke, house dust, molds, pollens, animal dander, certain insects, exercise, and even cold air are all triggers that can cause an asthma attack. Often, no specific triggers are identified.  Take the following measures around your house to reduce attacks:  Avoid cigarette and other smoke. No smoking should be allowed in a home where someone with asthma lives. If smoking is allowed indoors, it should be done in a room with a closed door, and a window should be opened to clear the air. If possible, do not use a wood-burning stove, kerosene heater, or fireplace. Minimize exposure to all sources of smoke, including incense, candles, fires, and fireworks.   Decrease pollen exposure. Keep your windows shut and use central air during the pollen allergy season. Stay indoors with windows closed from late morning to afternoon, if you can. Avoid mowing the lawn if you have grass pollen allergy. Change your clothes and shower after being outside during this time of year.   Remove molds from bathrooms and wet areas. Do this by cleaning the floors with a fungicide or diluted bleach. Avoid using humidifiers, vaporizers, or swamp coolers. These can spread molds through the air. Fix leaky faucets, pipes, or other sources of water that have mold around them.   Decrease house dust exposure. Do this by using bare floors, vacuuming frequently, and changing furnace and air cooler filters frequently. Avoid using feather, wool, or foam bedding. Use polyester pillows and plastic covers over your mattress. Wash bedding weekly in hot water (hotter than 130 F).   Try to get someone else to vacuum for you once or twice a week, if you  can. Stay out of rooms while they are being vacuumed and for a short while afterward. If you vacuum, use a dust mask (from a hardware store), a double-layered or microfilter vacuum cleaner bag, or a vacuum cleaner with a HEPA filter.   Avoid perfumes, talcum powder, hair spray, paints and other strong odors and fumes.   Keep warm-blooded pets (cats, dogs, rodents, birds) outside the home if they are triggers for asthma. If you can't keep the pet outdoors, keep the pet out of your bedroom and other sleeping areas at all times, and keep the door closed. Remove carpets and furniture covered with cloth from your home. If that is not possible, keep the pet away from fabric-covered furniture and carpets.   Eliminate cockroaches. Keep food and garbage in closed containers. Never leave food out. Use poison baits, traps, powders, gels, or paste (for example, boric acid). If a spray is used to kill cockroaches, stay out of the room until the odor goes away.   Decrease indoor humidity to less than 60%. Use an indoor air cleaning device.   Avoid sulfites in foods and beverages. Do not drink beer or wine or eat dried fruit, processed potatoes, or shrimp if they cause asthma symptoms.   Avoid cold air. Cover your nose and mouth with a scarf on cold or windy days.   Avoid aspirin. This is the most common drug causing serious asthma attacks.   If exercise triggers your asthma, ask your caregiver how you should prepare before exercising. (For example, ask if you could use your inhaler 10 minutes  before exercising.)   Avoid close contact with people who have a cold or the flu since your asthma symptoms may get worse if you catch the infection from them. Wash your hands thoroughly after touching items that may have been handled by others with a respiratory infection.   Get a flu shot every year to protect against the flu virus, which often makes asthma worse for days to weeks. Also get a pneumonia shot once every  five to 10 years.  Call your caregiver if you want further information about measures you can take to help prevent asthma attacks. Document Released: 07/18/2005 Document Revised: 07/07/2011 Document Reviewed: 05/26/2009 [facility] Patient Information 2012 [place], [place].  Asthma, F.L.A.R.E. Most people with asthma do not get so sick that they need emergency care. The fact that you had to get emergency care may mean:  You are not taking your long-term control medicine the right way.   You have not been prescribed any or enough long-term control medicine.   You are still exposed to triggers that start your asthma symptoms.  You can avoid asthma flare-ups by using this F.L.A.R.E. plan until you see your primary doctor. F FOLLOW UP WITH YOUR PRIMARY DOCTOR - CALL TO MAKE AN APPOINTMENT TO BE SEEN.  If you have trouble making an appointment, ask to speak to the office nurse.   If you do not have a primary care doctor, call to get one.  At the follow-up appointment:  Bring all of your medications and this plan with you.   Make an asthma action plan with your doctor that you can follow every day to keep your asthma under control.   Write down your questions and your doctor's answers.  This will make your emergency visits rare. L LEARN ABOUT YOUR ASTHMA MEDICINES. TAKE ALL OF THESE MEDICINES JUST AS THE DOCTOR TELLS YOU, EVEN IF YOU ARE FEELING MUCH BETTER. Quick-relief or Rescue  Kind of medicine   Name of medicine   How much   How often & how long you need to take it  Long-term control  Kind of medicine   Name of medicine   How much   How often & how long you need to take it  Steroid pills or syrup  Kind of medicine   Name of medicine   How much   How often & how long you need to take it  A ASTHMA IS A LIFE-LONG (CHRONIC) DISEASE. Even though your breathing is better after getting emergency care, you still need to get long-term control of your asthma. If you do  not, you are at risk for more severe flare-ups and even death.  If you use quick-relief medicine more than 2 times per week, your asthma is not under control. You need to see your doctor or an asthma specialist to make a plan to get control of your asthma.   Take long-term control medicine every day as ordered by your doctor.   Figure out what things make your asthma flare up and try to stay away from these "triggers."  R RESPOND TO THESE WARNING SIGNS THAT YOUR ASTHMA IS GETTING WORSE:  Your chest feels tight.   You are short of breath.   You are wheezing.   You are coughing.   Your peak flow is getting low.  Keep taking your medicines as prescribed and call your doctor. E EMERGENCY CARE MAY BE NEEDED IF YOU:  Have trouble talking.   Are working hard to breathe (may  see skin sucking in at rib cage or above breast bone).   Need to use quick-relief medicine more often than every 4 hours.   See your peak flow dropping.  Take your quick-relief medicine and wait 20 minutes. If you do not feel better, take it again and wait 20 minutes. If you still do not feel better, take it again and call your doctor or 911 right away! LEARN ABOUT ASTHMA Asthma is a life-long disease that can make it hard for you to get air in and out of your lungs. Your asthma triggers make the air tubes in your lungs get smaller. These are the tubes that carry air in and out of your lungs. Here is what happens:  Small breathing tubes in your lungs swell and make extra mucus.   Muscles around the small breathing tubes get tight and make them smaller.   Smaller breathing tubes then get clogged with the extra mucus.   Swelling, muscle tightness, and mucus make it harder for you to breathe. You start to cough and wheeze and your chest might feel tight.  Not all asthma flare-ups are the same. Some are worse than others. In a severe asthma flare-up, the breathing tubes get so small that air cannot get in and out of  the lungs. People can die if their asthma flare-up is severe. ASTHMA MEDICINES  Quick-relief or Rescue medicine: should help for about 4 hours, relaxes the muscles around your breathing tubes so air can get in and out. If you need to take quick-relief medicine more than 2 times per week, your asthma is not under control, and you should ask your doctor about long-term control medicine.   Long-term control medicine: must be taken every day in order to work right. It keeps your breathing tubes from swelling, and can prevent most asthma flare-ups. This medicine cannot stop a flare-up once it starts. During flare-ups, use quick-relief medicine right away and take your long-term control medicine as usual.   Steroid pills or syrup: can help the swelling in your breathing tubes go away. You must take this medicine just as the doctor tells you to. Do not skip a dose, and do not stop taking it unless a doctor tells you to stop.  TRIGGERS: TELL YOUR DOCTOR ABOUT THE THINGS THAT MAKE YOUR ASTHMA WORSE. What started or triggered your asthma flare-up this time? COMMON ASTHMA TRIGGERS:  Breathing in chemicals, dusts, or fumes at work.   Colds or flu.   Animals.   Dust.   Pollen and mold.   Strong odors.   Weather.   Exercise.   Cigarette and other smoke.   Medicines.  Smoking and secondhand smoke are asthma triggers. If you smoke, choose to quit. Never let others smoke near you or your children. Call your doctor or your health plan for help quitting. FOR MORE INFORMATION Ast[employer] of [place]: www.https://bond-evans.com/ American Lung Association: www.lungusa.org Asthma and Allergy Foundation of [place]: www.aafa.org This plan and asthma information are based on the NAEPP Guidelines for the Diagnosis and Management of Asthma, (ShinProtection.co.uk). Document Released: 02/23/2006 Document Revised: 07/07/2011 Document Reviewed: 05/04/2006 [facility] Patient  Information 2012 [place], [place].

## 2012-03-15 NOTE — Progress Notes (Signed)
  Subjective:    Patient ID: Veronica Shaw, female    DOB: 09-06-1950, 61 y.o.   MRN: [id number]  HPI  Veronica Shaw is an 61 y.o. female who presents for follow up of asthma. The patient is not currently have symptoms / an exacerbation. The patient has been using albuterol to 3 times per day for shortness of breath. Patient says that she becomes short of breath after walking around her house, cleaning, cooking. Symptoms in previous episodes have included dyspnea. Treatments tried during prior episodes include short-acting inhaled beta-adrenergic agonists and Qvar,  she was unable to pick up Singulair due to cost.  Since Qvar was increased, patient complains of throat irritation and headaches. She also complains of increasing cough. She is asking for a refill of Tessalon Perles.  Patient has been seen by Dr. Raymondo Band, pulmonary function tests did show restrictive lung disease.  A chest x-ray did show history of granulomatous disease.  Patient was unaware of this and denies any history of lung disease, other than asthma which she was diagnosed with at 61 years old.   Review of Systems  per history of present illness     Objective:   Physical Exam  Oxygen saturation 96% on room air General appearance: alert, cooperative and no distress Throat: lips, mucosa, and tongue normal; teeth and gums normal Lungs: clear to auscultation bilaterally and poor effort, but no wheezes rales or rhonchi Heart: regular rate and rhythm, S1, S2 normal, no murmur, click, rub or gallop Lymph nodes: Cervical adenopathy: none      Assessment & Plan:    Severe persistent asthma, ongoing.     Plan:    See Problem List

## 2012-03-30 ENCOUNTER — Ambulatory Visit
Admission: RE | Admit: 2012-03-30 | Discharge: 2012-03-30 | Disposition: A | Discharge status: Home / Self Care | Payer: No Typology Code available for payment source | Source: Ambulatory Visit | Attending: Family Medicine | Admitting: Family Medicine

## 2012-03-30 ENCOUNTER — Ambulatory Visit (INDEPENDENT_AMBULATORY_CARE_PROVIDER_SITE_OTHER): Payer: No Typology Code available for payment source | Admitting: Family Medicine

## 2012-03-30 VITALS — BP 148/73 | HR 115 | Temp 99.1°F | Ht 62.0 in | Wt 141.7 lb

## 2012-03-30 DIAGNOSIS — M542 Cervicalgia: Secondary | ICD-10-CM

## 2012-03-30 DIAGNOSIS — S161XXA Strain of muscle, fascia and tendon at neck level, initial encounter: Secondary | ICD-10-CM

## 2012-03-30 MED ORDER — HYDROCODONE-ACETAMINOPHEN 5-325 MG PO TABS: 1.0000 | ORAL_TABLET | Freq: Four times a day (QID) | ORAL | Status: DC | PRN

## 2012-03-30 MED ORDER — CYCLOBENZAPRINE HCL 10 MG PO TABS: 10.0000 mg | ORAL_TABLET | Freq: Three times a day (TID) | ORAL | Status: DC | PRN

## 2012-03-30 NOTE — Patient Instructions (Signed)
It was good to see you today! I want you to go to [facility] and get an x-ray of your neck.  If there is anything concerning on it, I will give you a call. You can take both of the medicines I am prescribing for you to help with the pain. You can also try seeing a chiropractor to help with your neck.  If you are interested, you can also make an appointment to see one of the physicians in our office who does manipulation (likely Dr. Berline Chough) next week.

## 2012-03-30 NOTE — Progress Notes (Signed)
Patient ID: Veronica Shaw, female   DOB: [birth year], 61 y.o.   MRN: [id number] Subjective: The patient is a 61 y.o. year old female who presents today for neck and shoulder pain.  1. Pain: Pain began two days ago.  Patient reports that she could not get out of bed due to pain.  No unusual activity three days ago.  Pain is constant and has both stabbing and aching quality.  Has some chronic weakness in hands and chronic numbness in fingers, not changes recently.  Any neck movement makes worse.  Has not had any previous neck problems.  Patient took her daughter's muscle relaxant and a Vicodin which improved her symptoms somewhat.  Patient's past medical, social, and family history were reviewed and updated as appropriate. History  Substance Use Topics  . Smoking status: Passive Smoker  . Smokeless tobacco: Not on file   Comment: exposed to smoke during child hood (parents)  . Alcohol Use: Not on file   Objective:  Filed Vitals:   03/30/12 1032  BP: 148/73  Pulse: 115  Temp: 99.1 F (37.3 C)   Gen: NAD Neck: Full ROM with pain, no bony abnormalities.  Significant muscle spasm.  Several areas have palpable knots and are more acutely painful.  Procedure Note: Informed consent obtained.  Patient's neck sterilized with alcohol.  Time out performed.  4 trigger points injected with solution of 1% lidocaine and kenalog.  Total of 4cc of lidocaine and 1cc kenalog used  Assessment/Plan: Cervical strain, acute.  Obtain neck x-ray to rule out compression fracture (patient reports prior history of compression fracture of lower back) but will otherwise treat symptomatically.  If persists through weekend, have recommended that she consider manipulation and have provided her with Dr. Janeece Riggers name if she is interested in pursuing this.  Please also see individual problems in problem list for problem-specific plans.

## 2012-04-03 ENCOUNTER — Telehealth: Payer: Self-pay | Admitting: Family Medicine

## 2012-04-03 NOTE — Telephone Encounter (Signed)
Pt is asking for results of her xray from Friday

## 2012-04-04 ENCOUNTER — Encounter: Payer: Self-pay | Admitting: Sports Medicine

## 2012-04-04 ENCOUNTER — Ambulatory Visit (INDEPENDENT_AMBULATORY_CARE_PROVIDER_SITE_OTHER): Payer: Self-pay | Admitting: Sports Medicine

## 2012-04-04 VITALS — BP 150/67 | HR 115 | Temp 98.8°F | Ht 62.5 in | Wt 140.9 lb

## 2012-04-04 DIAGNOSIS — M542 Cervicalgia: Secondary | ICD-10-CM

## 2012-04-04 DIAGNOSIS — M4810 Ankylosing hyperostosis [Forestier], site unspecified: Secondary | ICD-10-CM

## 2012-04-04 DIAGNOSIS — M47812 Spondylosis without myelopathy or radiculopathy, cervical region: Secondary | ICD-10-CM | POA: Insufficient documentation

## 2012-04-04 DIAGNOSIS — M999 Biomechanical lesion, unspecified: Secondary | ICD-10-CM

## 2012-04-04 DIAGNOSIS — M47819 Spondylosis without myelopathy or radiculopathy, site unspecified: Secondary | ICD-10-CM | POA: Insufficient documentation

## 2012-04-04 MED ORDER — DEXAMETHASONE SODIUM PHOSPHATE 4 MG/ML IJ SOLN
4.0000 mg | Freq: Once | INTRAMUSCULAR | Status: AC
  Administered 2012-04-04: 4 mg via INTRAMUSCULAR

## 2012-04-04 MED ORDER — TRAMADOL HCL 50 MG PO TABS
50.0000 mg | ORAL_TABLET | Freq: Three times a day (TID) | ORAL | Status: DC | PRN
Start: 2012-04-04 — End: 2012-04-18

## 2012-04-04 MED ORDER — KETOROLAC TROMETHAMINE 60 MG/2ML IM SOLN
60.0000 mg | Freq: Once | INTRAMUSCULAR | Status: AC
  Administered 2012-04-04: 60 mg via INTRAMUSCULAR

## 2012-04-04 NOTE — Patient Instructions (Addendum)
It was nice to meet you today.  I am referring you for physical therapy and an MRI of your neck to further evaluate you swallowing and breathing problems.  Continue your flexeril and Mobic  I have added Tramadol to your medications to help with your pain.  Please follow up in 2 weeks with me to go over the results of your MRI.

## 2012-04-10 ENCOUNTER — Ambulatory Visit (HOSPITAL_COMMUNITY)
Admission: RE | Admit: 2012-04-10 | Discharge: 2012-04-10 | Disposition: A | Discharge status: Home / Self Care | Payer: Self-pay | Source: Ambulatory Visit | Attending: Family Medicine | Admitting: Family Medicine

## 2012-04-10 ENCOUNTER — Telehealth: Payer: Self-pay | Admitting: Cardiology

## 2012-04-10 DIAGNOSIS — M4810 Ankylosing hyperostosis [Forestier], site unspecified: Secondary | ICD-10-CM | POA: Insufficient documentation

## 2012-04-10 DIAGNOSIS — M5030 Other cervical disc degeneration, unspecified cervical region: Secondary | ICD-10-CM | POA: Insufficient documentation

## 2012-04-10 DIAGNOSIS — M5020 Other cervical disc displacement, unspecified cervical region: Secondary | ICD-10-CM | POA: Insufficient documentation

## 2012-04-10 DIAGNOSIS — M47812 Spondylosis without myelopathy or radiculopathy, cervical region: Secondary | ICD-10-CM | POA: Insufficient documentation

## 2012-04-10 NOTE — Telephone Encounter (Signed)
Alvis Lemmings [facility] [phone]  plz return call to [facility] regarding new med RX for patient

## 2012-04-10 NOTE — Telephone Encounter (Signed)
Left message to call back  

## 2012-04-12 DIAGNOSIS — M999 Biomechanical lesion, unspecified: Secondary | ICD-10-CM | POA: Insufficient documentation

## 2012-04-12 DIAGNOSIS — M542 Cervicalgia: Secondary | ICD-10-CM | POA: Insufficient documentation

## 2012-04-12 NOTE — Assessment & Plan Note (Addendum)
See DISH section  Toradol and Decadron provided today.  Starting Tramadol

## 2012-04-12 NOTE — Progress Notes (Signed)
  [facility]  Patient name: Veronica Shaw MRN [id number]  Date of birth: Nov 30, 1950  CC & HPI:  Veronica Shaw is a 61 y.o. female presenting today for evaluation of back pain.  She's been seen previously in the medicine clinic and has received trigger point injections.  She's been referred to me for osteopathic manipulation.  She reports that she has had chronic ongoing musculoskeletal pain including thoracic, lumbar, cervical back and neck pain as well as recurrent Achilles tendinitis.  On further review she does elicit chronic changes over time associated with her neck pain including odynophagia, subjective voice changes, respiratory issues that have been difficult to manage.  She has not tried physical therapy but has been started on Flexeril as well as Mobic for her pain.    She reports that her pain is is now limiting her ability to perform ADLs and she has almost no neck motion. ROS:  Per history of present illness  Pertinent History Reviewed:  Medical & Surgical Hx:  Reviewed: Significant for multiple chronic musculoskeletal problems, diabetes, hypertension, gastritis, chronic urticaria Medications: Reviewed & Updated - see associated section Social History: Reviewed - Significant for passive smoke exposure however not a smoker her self  Objective Findings:  Vitals:  Filed Vitals:   04/04/12 0844  BP: 150/67  Pulse: 115  Temp: 98.8 F (37.1 C)    PE: GENERAL:  Adult female. In no discomfort; no respiratory distress. PSYCH: Alert and appropriately interactive; Insight:Good   MSK:  Cervical range of motion: She is approximately 5 of motion in all planes including flexion-extension side bending and rotation.  She has profound paracervical muscle spasm.  She is a negative Spurling's test, upper extremity strength is 5 out of 5 bilaterally in upper [place] myotomes.  Upper Extremity Reflexes are 2+ out of 4 bilaterally.  Heberden and Bouchard Nodes OMM exam:  Right levator scapulae Jones' tender point.  R Rhomboid Jones' Tenderpoint.    Extensive chart review in review of prior imaging studies including cervical and thoracic X-rays reviewed personally prior to evaluation the patient.  Chest X-ray with characteristic lipping mainly on R consistent with DISH however not specific  Assessment & Plan:

## 2012-04-12 NOTE — Assessment & Plan Note (Signed)
Patient has many of the characteristic findings on plain film x-ray as well as history and physical of DISH syndrome.  She does have increased morning pain and she has physical exam findings Bouchard and Heberden nodes.  Plain film x-rays do show predominance of right-sided involvement of the thoracic spine which is consistent with dish.  Concerning though is the fact that reports odynophagia, voice changes and difficult to control respiratory symptoms.  I'm going to order an MRI to further evaluate for soft tissue involvement and consider referral to neurosurgery pending the outcomes of these findings.  I do not feel aggressive manipulation at this time will benefit her greatly however I do think that physical therapy will be provide some pain relief through modalities.  I will also provide her with some tramadol.  We'll also consider other rheumatologic causes a will defer further workup until MRI findings her back.  Patient will followup in 2 weeks to discuss these findings with myself.

## 2012-04-12 NOTE — Assessment & Plan Note (Signed)
Counterstrain OMT provided to Cervical and Thoracic Regions. Otherwise aggressive mobilization treatment not indicated

## 2012-04-16 ENCOUNTER — Ambulatory Visit: Payer: Self-pay | Admitting: Sports Medicine

## 2012-04-17 NOTE — Telephone Encounter (Signed)
Have not received a return call from [facility]

## 2012-04-18 ENCOUNTER — Encounter: Payer: Self-pay | Admitting: Sports Medicine

## 2012-04-18 ENCOUNTER — Ambulatory Visit (INDEPENDENT_AMBULATORY_CARE_PROVIDER_SITE_OTHER): Payer: Self-pay | Admitting: Sports Medicine

## 2012-04-18 VITALS — BP 129/72 | HR 112 | Temp 99.0°F | Ht 62.6 in | Wt 140.2 lb

## 2012-04-18 DIAGNOSIS — Z23 Encounter for immunization: Secondary | ICD-10-CM

## 2012-04-18 DIAGNOSIS — M4810 Ankylosing hyperostosis [Forestier], site unspecified: Secondary | ICD-10-CM

## 2012-04-18 DIAGNOSIS — M47819 Spondylosis without myelopathy or radiculopathy, site unspecified: Secondary | ICD-10-CM

## 2012-04-18 DIAGNOSIS — M479 Spondylosis, unspecified: Secondary | ICD-10-CM

## 2012-04-18 MED ORDER — TRAMADOL HCL 50 MG PO TABS: 50.0000 mg | ORAL_TABLET | Freq: Three times a day (TID) | ORAL | Status: DC | PRN

## 2012-04-18 MED ORDER — HYDROCODONE-ACETAMINOPHEN 5-325 MG PO TABS: 1.0000 | ORAL_TABLET | Freq: Four times a day (QID) | ORAL | Status: AC | PRN

## 2012-04-18 MED ORDER — CYCLOBENZAPRINE HCL 10 MG PO TABS: 10.0000 mg | ORAL_TABLET | Freq: Three times a day (TID) | ORAL | Status: AC | PRN

## 2012-04-18 NOTE — Assessment & Plan Note (Signed)
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  These results were reviewed with the patient and it was decided due to her Odynaophagia, voice changes and questionable involvement of her respiratory symptoms given her chronic ongoing difficult to control respiratory symptoms we'll refer her to neurosurgery.  We will have to refer her to a tertiary center due to insurance.  I will obtain a rheumatoid panel to evaluate for rheumatologic causes.  I have refilled her muscle relaxer, tramadol and provided her a prescription of 20 Norco as a when necessary basis for severe flares.  She is to continue her Mobic.  I question utility physical therapy or manipulation at this time given the extent of her symptoms.    She will follow with Dr. Birdie Sons for her ongoing medical problems

## 2012-04-18 NOTE — Progress Notes (Signed)
  [facility]  Patient name: Veronica Shaw MRN [id number]  Date of birth: 1951-05-06  CC & HPI:  Veronica Shaw is a 61 y.o. female presenting today for followup of her chronic neck pain.  She does report that the muscle relaxer and trimmed all were helping.  She does report that on occasion however she does need something more than this as she is stuck in bed due to this neck pain.  Even small movements  such as driving card will cover her shoulder will aggravate her pain on occasion.  She denies any radicular symptoms at this time. She does report prior diagnosis of rheumatoid arthritis upon further questioning that was not disclosed previously.  She stopped following up due to financial constraints and has not returned since that time.  ROS:  She reports her breathing is better.  She does still have problems with swallowing and sometimes pain with swallowing.  Does report persistent voice changes although it appears unchanged from last office visit.  Pertinent History Reviewed:  Medical & Surgical Hx:  Reviewed: Significant for rheumatoid arthritis diagnosed approximately 15 years ago without further followup, asthma, recurrent Achilles tendinitis, plantar fasciitis, chronic back pain, diabetes, dyslipidemia, hypertension  Medications: Reviewed & Updated - see associated section Social History: Reviewed - Significant for nonsmoker  Objective Findings:  Vitals:  Filed Vitals:   04/18/12 0926  BP: 129/72  Pulse: 112  Temp: 99 F (37.2 C)    PE: GENERAL:  Adult female. In mild discomfort; no respiratory distress. Cervical: Range of motion approximately 5-10 in all planes.  Improved muscle spasms and bilateral trapezius and levator scapula.  No gross upper extremity or weakness    Assessment & Plan:

## 2012-04-18 NOTE — Patient Instructions (Addendum)
It was good to see you.  I am checking some labs today to help [place] with your rheumatoid arthritis.  I am referring you to Neurosurgery for your profound degenerative changes in your cervical spine.   I have refilled your muscle relaxer and your two pain medicines.  Take the tramadol daily.  The Norco is only for when your pain is 10/10

## 2012-04-19 LAB — RHEUMATOID FACTOR: Rhuematoid fact SerPl-aCnc: <10 IU/mL (ref <=14)

## 2012-04-19 LAB — CYCLIC CITRUL PEPTIDE ANTIBODY, IGG: Cyclic Citrullin Peptide Ab: <2 U/mL (ref 0.0–5.0)

## 2012-05-03 ENCOUNTER — Telehealth: Payer: Self-pay | Admitting: Family Medicine

## 2012-05-03 NOTE — Telephone Encounter (Signed)
Patient is calling for lab results from a couple of weeks ago.

## 2012-05-08 ENCOUNTER — Ambulatory Visit: Payer: Self-pay

## 2012-05-09 ENCOUNTER — Telehealth: Payer: Self-pay | Admitting: *Deleted

## 2012-05-09 DIAGNOSIS — M4810 Ankylosing hyperostosis [Forestier], site unspecified: Secondary | ICD-10-CM

## 2012-05-09 NOTE — Telephone Encounter (Signed)
[facility] calling because patient needs an updated referral for PT.  Has appt tomorrow for eval.  Will route request to Dr. Birdie Sons (in clinic this afternoon).  Gaylene Brooks, RN

## 2012-05-09 NOTE — Telephone Encounter (Signed)
Patient with recent diagnosis of DISH. Has PT appointment tomorrow and needed renewal of PT referral.  This was done for the patient.

## 2012-05-10 ENCOUNTER — Ambulatory Visit: Payer: Self-pay | Attending: Sports Medicine | Admitting: Physical Therapy

## 2012-05-10 DIAGNOSIS — M6281 Muscle weakness (generalized): Secondary | ICD-10-CM | POA: Insufficient documentation

## 2012-05-10 DIAGNOSIS — IMO0001 Reserved for inherently not codable concepts without codable children: Secondary | ICD-10-CM | POA: Insufficient documentation

## 2012-05-10 DIAGNOSIS — M2560 Stiffness of unspecified joint, not elsewhere classified: Secondary | ICD-10-CM | POA: Insufficient documentation

## 2012-05-10 DIAGNOSIS — R293 Abnormal posture: Secondary | ICD-10-CM | POA: Insufficient documentation

## 2012-05-10 DIAGNOSIS — M542 Cervicalgia: Secondary | ICD-10-CM | POA: Insufficient documentation

## 2012-05-10 NOTE — Telephone Encounter (Signed)
Called and discussed results with pt.  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  Unable to follow up with Neurosurgery due to 375$ upfront charge.  Continuing PT here.  No insurance, lost [employer].  Can reapply in Jan.  F/u as needed.

## 2012-05-21 ENCOUNTER — Ambulatory Visit: Payer: Self-pay

## 2012-05-23 ENCOUNTER — Ambulatory Visit: Payer: Self-pay

## 2012-05-28 ENCOUNTER — Other Ambulatory Visit: Payer: Self-pay | Admitting: Family Medicine

## 2012-05-28 ENCOUNTER — Ambulatory Visit: Payer: Self-pay

## 2012-05-28 MED ORDER — ALBUTEROL SULFATE HFA 108 (90 BASE) MCG/ACT IN AERS: 2.0000 | INHALATION_SPRAY | RESPIRATORY_TRACT | Status: DC | PRN

## 2012-05-28 NOTE — Telephone Encounter (Signed)
Refilled patients Ventolin inhaler.

## 2012-05-30 ENCOUNTER — Ambulatory Visit: Payer: Self-pay | Admitting: Rehabilitative and Restorative Service Providers"

## 2012-05-30 ENCOUNTER — Encounter: Payer: Self-pay | Admitting: Rehabilitative and Restorative Service Providers"

## 2012-06-04 ENCOUNTER — Ambulatory Visit: Payer: Self-pay | Attending: Sports Medicine | Admitting: Rehabilitative and Restorative Service Providers"

## 2012-06-04 DIAGNOSIS — M542 Cervicalgia: Secondary | ICD-10-CM | POA: Insufficient documentation

## 2012-06-04 DIAGNOSIS — M2560 Stiffness of unspecified joint, not elsewhere classified: Secondary | ICD-10-CM | POA: Insufficient documentation

## 2012-06-04 DIAGNOSIS — R293 Abnormal posture: Secondary | ICD-10-CM | POA: Insufficient documentation

## 2012-06-04 DIAGNOSIS — IMO0001 Reserved for inherently not codable concepts without codable children: Secondary | ICD-10-CM | POA: Insufficient documentation

## 2012-06-04 DIAGNOSIS — M6281 Muscle weakness (generalized): Secondary | ICD-10-CM | POA: Insufficient documentation

## 2012-06-06 ENCOUNTER — Ambulatory Visit: Payer: Self-pay

## 2012-06-08 ENCOUNTER — Ambulatory Visit: Payer: Self-pay | Admitting: Family Medicine

## 2012-06-11 ENCOUNTER — Encounter: Payer: Self-pay | Admitting: Rehabilitative and Restorative Service Providers"

## 2012-06-11 ENCOUNTER — Ambulatory Visit: Payer: Self-pay | Admitting: Family Medicine

## 2012-06-13 ENCOUNTER — Encounter: Payer: Self-pay | Admitting: Rehabilitative and Restorative Service Providers"

## 2012-06-15 ENCOUNTER — Encounter: Payer: Self-pay | Admitting: Family Medicine

## 2012-06-15 ENCOUNTER — Ambulatory Visit (INDEPENDENT_AMBULATORY_CARE_PROVIDER_SITE_OTHER): Payer: Self-pay | Admitting: Family Medicine

## 2012-06-15 VITALS — BP 131/84 | HR 105 | Temp 99.1°F | Ht 62.5 in | Wt 145.0 lb

## 2012-06-15 VITALS — BP 142/79 | HR 97 | Ht 62.5 in | Wt 140.0 lb

## 2012-06-15 DIAGNOSIS — M47819 Spondylosis without myelopathy or radiculopathy, site unspecified: Secondary | ICD-10-CM

## 2012-06-15 DIAGNOSIS — M479 Spondylosis, unspecified: Secondary | ICD-10-CM

## 2012-06-15 DIAGNOSIS — M4810 Ankylosing hyperostosis [Forestier], site unspecified: Secondary | ICD-10-CM

## 2012-06-15 DIAGNOSIS — M722 Plantar fascial fibromatosis: Secondary | ICD-10-CM

## 2012-06-15 HISTORY — DX: Plantar fascial fibromatosis: M72.2

## 2012-06-15 MED ORDER — HYDROCODONE-ACETAMINOPHEN 10-325 MG PO TABS: 1.0000 | ORAL_TABLET | Freq: Three times a day (TID) | ORAL | Status: DC | PRN

## 2012-06-15 MED ORDER — CYCLOBENZAPRINE HCL 5 MG PO TABS: 5.0000 mg | ORAL_TABLET | Freq: Three times a day (TID) | ORAL | Status: DC | PRN

## 2012-06-15 MED ORDER — MELOXICAM 7.5 MG PO TABS: 7.5000 mg | ORAL_TABLET | Freq: Every day | ORAL | Status: DC

## 2012-06-15 MED ORDER — TRAMADOL HCL 50 MG PO TABS: 50.0000 mg | ORAL_TABLET | Freq: Three times a day (TID) | ORAL | Status: DC | PRN

## 2012-06-15 NOTE — Assessment & Plan Note (Signed)
Patient with MRI evidence of severe spondyloarthropathy consistent with RA, psoriatic arthritis, or DISH syndrome.  Patient previously referred to [facility] given changes in voice and pain with swallowing, but was unable to make this appointment due to financial concerns.  Have asked that she contact [facility] to see if they have payment assistance program to help cover her initial visit as I believe it will be beneficial to get their input on this issue.   At this visit we addressed her pain and management of this issue.  Her new head pain is likely related to tension headache related to chronic muscle spasm in her neck and trapezius.  Have prescribed flexeril for the muscle spasm. Refilled her tramadol and norco to use prn for pain. Advised to try tylenol first if the pain is only mild before moving onto these stronger medications. Also, advised Aspercreme use.

## 2012-06-15 NOTE — Progress Notes (Signed)
  Subjective:    Patient ID: Veronica Shaw, female    DOB: [birth year], 61 y.o.   MRN: [id number]  HPI  Bilateral foot pain worse in the morning with the first step. The right foot has bothered her off and on for several years. The left foot has just been bothering her the last 3 weeks. The left foot actually hurts worse. It feels like she standing on a nail. She recalls no specific injury, no new activity or extended walking. No new shoes.  Review of Systems Denies unusual weight change, fever. Denies numbness in her feet.    Objective:   Physical Exam Vital signs are reviewed GENERAL: Well-developed female no acute distress FEET: Bilaterally she has some pes planus it is more pronounced on the left foot with some medial foot breakdown. On both sides the origin the plantar fascia is tender to palpation and this reproduces her pain. The foot is neurovascularly intact.       Assessment & Plan:

## 2012-06-15 NOTE — Patient Instructions (Addendum)
Please do suggested exercises for plantar fasciitis daily  Try doing Ice baths in the evenings on your heels  Use greens insoles in shoes as much as possible  Please follow up in 1 month  Thank you for seeing [place] today!

## 2012-06-15 NOTE — Patient Instructions (Signed)
Nice to meet you today. Sorry to hear you have been in so much pain.  I am going to refill several of your medications.   Please take the flexeril for the muscle spasm in your neck.  Also, continue to take the Norco for pain as needed and well as take the meloxicam for pain. If your pain is not too bad please try tylenol first prior to taking the above medications. Please attempt to call Neurosurgery at [facility] to see if they have any payment assistance programs. Thank you for your visit today.

## 2012-06-15 NOTE — Assessment & Plan Note (Signed)
On exam her fascia is mildly tender to palpation. I think we will start with conservative treatment using green insoles and scaphoid pads. We'll start her on strengthening exercises for the foot toe flexors and arch. See her back in 3-4 weeks. Would consider corticosteroid injection at that time.

## 2012-06-15 NOTE — Progress Notes (Signed)
  Subjective:    Patient ID: ERCIE ELIASEN, female    DOB: 1951/01/18, 61 y.o.   MRN: [id number]  HPI Patient is a 61 yo female who presents with complaint of headache and wanting to know if there is anything in her ear.  Patient recently had cervical spine XR and cervical spine MRI that revealed many bony changes with what appears to be fusion and osteophytes at several levels.  She has been seen for pain in her neck and shoulders previously.  Her pain today is located on the left side of her head in the region of her temple, over her ear, and behind her ear.  No associated visual changes with this.  This is a dull pain, is present everyday. States it doesn't feel like her previous migraines. Notes that she feels as though there is something in her ear as well.  Also, has pain in left trapezius that has been present for some time. Notes that she cannot rotate her head more than a few degrees.  Has been on tramadol, meloxicam, and norco as needed for pain.  States she has been taking the tramadol daily and the norco about 2x/week with some improvement in her pain.  She was previously referred to [facility] neurosurgery for evaluation given the location of one of the osteophytes and her complaint of hoarseness.  She was unable to afford the upfront fee so has not been able to see them regarding this issue.    Review of Systems negative except per HPI     Objective:   Physical Exam  Constitutional: She appears well-developed and well-nourished.  Neck:       ROM limited to 5 degrees rotation to the left and 10 degrees rotation to the right. Unable to extend or flex neck with out significant pain.  Cardiovascular: Normal rate, regular rhythm and normal heart sounds.   Pulmonary/Chest: Effort normal and breath sounds normal.  Musculoskeletal:       Tender to palpation over left trapezius.  Muscle tightness felt in same distribution as tenderness. No tenderness to palpation over spinous processes in cervical  spine.  Neurological:       5/5 strength in RUE, 5/5 LUE with exception of deltoid that was limited by pain. Biceps reflexes 2+. Sensation intact. Negative spurlings.           Assessment & Plan:

## 2012-09-05 ENCOUNTER — Other Ambulatory Visit: Payer: Self-pay | Admitting: *Deleted

## 2012-09-05 MED ORDER — NITROGLYCERIN 0.4 MG SL SUBL: 0.4000 mg | SUBLINGUAL_TABLET | SUBLINGUAL | Status: DC | PRN

## 2012-09-18 ENCOUNTER — Other Ambulatory Visit: Payer: Self-pay

## 2012-09-18 MED ORDER — NITROGLYCERIN 0.4 MG SL SUBL: 0.4000 mg | SUBLINGUAL_TABLET | SUBLINGUAL | Status: DC | PRN

## 2012-09-18 NOTE — Telephone Encounter (Signed)
..   Requested Prescriptions   Pending Prescriptions Disp Refills  . nitroGLYCERIN (NITROSTAT) 0.4 MG SL tablet 25 tablet 2    Sig: Place 1 tablet (0.4 mg total) under the tongue every 5 (five) minutes as needed for chest pain. Take one tablet and place under tongue if develop chest pain may repeat x2

## 2012-09-18 NOTE — Telephone Encounter (Signed)
..   Requested Prescriptions   Signed Prescriptions Disp Refills  . nitroGLYCERIN (NITROSTAT) 0.4 MG SL tablet 25 tablet 2    Sig: Place 1 tablet (0.4 mg total) under the tongue every 5 (five) minutes as needed for chest pain. Take one tablet and place under tongue if develop chest pain may repeat x2    Authorizing Provider: Rollene Rotunda    Ordering User: Christella Hartigan, Joni Norrod Judie Petit

## 2012-09-19 ENCOUNTER — Ambulatory Visit (INDEPENDENT_AMBULATORY_CARE_PROVIDER_SITE_OTHER): Payer: No Typology Code available for payment source | Admitting: Family Medicine

## 2012-09-19 ENCOUNTER — Encounter: Payer: Self-pay | Admitting: Family Medicine

## 2012-09-19 ENCOUNTER — Ambulatory Visit (HOSPITAL_COMMUNITY)
Admission: RE | Admit: 2012-09-19 | Discharge: 2012-09-19 | Disposition: A | Discharge status: Home / Self Care | Payer: No Typology Code available for payment source | Source: Ambulatory Visit | Attending: Family Medicine | Admitting: Family Medicine

## 2012-09-19 VITALS — BP 157/80 | HR 93 | Ht 62.5 in | Wt 146.0 lb

## 2012-09-19 DIAGNOSIS — R0602 Shortness of breath: Secondary | ICD-10-CM | POA: Insufficient documentation

## 2012-09-19 DIAGNOSIS — R0609 Other forms of dyspnea: Secondary | ICD-10-CM

## 2012-09-19 DIAGNOSIS — R0689 Other abnormalities of breathing: Secondary | ICD-10-CM

## 2012-09-19 DIAGNOSIS — R0989 Other specified symptoms and signs involving the circulatory and respiratory systems: Secondary | ICD-10-CM

## 2012-09-19 DIAGNOSIS — R0600 Dyspnea, unspecified: Secondary | ICD-10-CM

## 2012-09-19 DIAGNOSIS — R059 Cough, unspecified: Secondary | ICD-10-CM | POA: Insufficient documentation

## 2012-09-19 HISTORY — DX: Dyspnea, unspecified: R06.00

## 2012-09-19 MED ORDER — SUCRALFATE 1 G PO TABS: 1.0000 g | ORAL_TABLET | Freq: Four times a day (QID) | ORAL | Status: DC

## 2012-09-19 MED ORDER — SUCRALFATE 1 GM/10ML PO SUSP: 1.0000 g | Freq: Four times a day (QID) | ORAL | Status: DC

## 2012-09-19 NOTE — Assessment & Plan Note (Addendum)
Differential includes: asthma exacerbation vs heart failure vs tachyarythmia vs GERD vs pneumonia. No wheezing on exam and no help with albuterol, makes asthma exacerbation less likely. Normal O2 sat on room air is also reassuring. Patient had myoview in may 2013 which was normal and had EF:58% which is reassuring, making heart failure unlikely at this time. Moreover, reading Dr. Jenene Slicker note, it appears that she has a 4 pillow orthopnea at baseline.  EKG obtained in office today did not show any tachy-arrhythmia. Will obtain CXR to rule out infectious process. This is very likely to be from uncontrolled GERD. Patient already on protnix 40mg  bid. Will treat with sucralfate (changed from suspension to tablet because suspension too expensive). Patient to return in 2 weeks for follow up. If continues to have symptoms, may consider h-pylori test and GI consult.  Red flags for return reviewed with patient.

## 2012-09-19 NOTE — Patient Instructions (Addendum)
We are going to check a chest xray to make sure that you do not have any evidence of pneumonia.  I think the symptoms are from bad acid reflux. I am sending in a prescription for you to take which should help with the feeling of reflux. It is very important to follow up in 1 week to make sure that things are better with the medicine.  Continue taking the qvar and the albuterol as needed. Don't take the albuterol if you feel it is not working.   Gastroesophageal Reflux Disease, Adult Gastroesophageal reflux disease (GERD) happens when acid from your stomach flows up into the esophagus. When acid comes in contact with the esophagus, the acid causes soreness (inflammation) in the esophagus. Over time, GERD may create small holes (ulcers) in the lining of the esophagus. CAUSES   Increased body weight. This puts pressure on the stomach, making acid rise from the stomach into the esophagus.  Smoking. This increases acid production in the stomach.  Drinking alcohol. This causes decreased pressure in the lower esophageal sphincter (valve or ring of muscle between the esophagus and stomach), allowing acid from the stomach into the esophagus.  Late evening meals and a full stomach. This increases pressure and acid production in the stomach.  A malformed lower esophageal sphincter. Sometimes, no cause is found. SYMPTOMS   Burning pain in the lower part of the mid-chest behind the breastbone and in the mid-stomach area. This may occur twice a week or more often.  Trouble swallowing.  Sore throat.  Dry cough.  Asthma-like symptoms including chest tightness, shortness of breath, or wheezing. DIAGNOSIS  Your caregiver may be able to diagnose GERD based on your symptoms. In some cases, X-rays and other tests may be done to check for complications or to check the condition of your stomach and esophagus. TREATMENT  Your caregiver may recommend over-the-counter or prescription medicines to help decrease  acid production. Ask your caregiver before starting or adding any new medicines.  HOME CARE INSTRUCTIONS   Change the factors that you can control. Ask your caregiver for guidance concerning weight loss, quitting smoking, and alcohol consumption.  Avoid foods and drinks that make your symptoms worse, such as:  Caffeine or alcoholic drinks.  Chocolate.  Peppermint or mint flavorings.  Garlic and onions.  Spicy foods.  Citrus fruits, such as oranges, lemons, or limes.  Tomato-based foods such as sauce, chili, salsa, and pizza.  Fried and fatty foods.  Avoid lying down for the 3 hours prior to your bedtime or prior to taking a nap.  Eat small, frequent meals instead of large meals.  Wear loose-fitting clothing. Do not wear anything tight around your waist that causes pressure on your stomach.  Raise the head of your bed 6 to 8 inches with wood blocks to help you sleep. Extra pillows will not help.  Only take over-the-counter or prescription medicines for pain, discomfort, or fever as directed by your caregiver.  Do not take aspirin, ibuprofen, or other nonsteroidal anti-inflammatory drugs (NSAIDs). SEEK IMMEDIATE MEDICAL CARE IF:   You have pain in your arms, neck, jaw, teeth, or back.  Your pain increases or changes in intensity or duration.  You develop nausea, vomiting, or sweating (diaphoresis).  You develop shortness of breath, or you faint.  Your vomit is green, yellow, black, or looks like coffee grounds or blood.  Your stool is red, bloody, or black. These symptoms could be signs of other problems, such as heart disease, gastric bleeding,  or esophageal bleeding. MAKE SURE YOU:   Understand these instructions.  Will watch your condition.  Will get help right away if you are not doing well or get worse. Document Released: 04/27/2005 Document Revised: 10/10/2011 Document Reviewed: 02/04/2011 [facility] Patient Information 2013 [place], [place].

## 2012-09-19 NOTE — Progress Notes (Signed)
Patient ID: Veronica Shaw    DOB: [birth year], 62 y.o.   MRN: [id number] --- Subjective:  Veronica Shaw is a 62 y.o.female who presents with mid chest tightness and shortness of breath, concerned for worsening asthma.  Symptoms started 2 weeks ago, associated with coughing and difficulty breathing worst at night time. Overall has improved some, but continues to have constant epigastric pressure, which is not usual for her. This is associated with burning sensation and excessive burping. She takes protonix 40mg  bid without relief of symptoms.   Shortness of breath with laying down, as well as after walking a 15-20 feet. Reports 2-3 pillow orthopnea and waking up short of breath. No increased lower extremity swelling. Has been taking albuterol 6-8 times per day (both inhaled and nebuliaed) without improvement of symptoms. Heard herself wheeze this am. Takes qvar, but stopped taking spiriva a few weeks ago because she started experiencing a blurred halo around visual field and pain around eye.  She denies any recent fever. No chills.    ROS: see HPI Past Medical History: reviewed and updated medications and allergies. Social History: Tobacco: doesn't smoke  Objective: Filed Vitals:   09/19/12 0852  BP: 157/80  Pulse: 93   O2: 97% on RA Physical Examination:   General appearance - alert, well appearing, and in no distress Nose - normal and patent, no erythema, discharge or polyps Mouth - mucous membranes moist, pharynx normal without lesions Chest - clear to auscultation, no wheezes, symmetric air entry, good air movement, faint crackles in lower fields.  Heart - tachycardic, regular rhythm, normal S1, S2, no murmurs, rubs, clicks or gallops Abdomen - soft, tender in epigastric region, negative Murphy's.  Extremities - no significant pedal edema.

## 2012-09-21 ENCOUNTER — Telehealth: Payer: Self-pay | Admitting: Family Medicine

## 2012-09-21 NOTE — Telephone Encounter (Signed)
Patient is calling for her xray results. Call [phone]

## 2012-09-21 NOTE — Telephone Encounter (Signed)
Called patient to let her know that her CXR did not show any evidence of infection. Patient understood. States she is feeling a little better and not using albuterol as much.

## 2012-09-27 ENCOUNTER — Ambulatory Visit (INDEPENDENT_AMBULATORY_CARE_PROVIDER_SITE_OTHER): Payer: No Typology Code available for payment source | Admitting: Family Medicine

## 2012-09-27 VITALS — BP 146/83 | HR 103 | Ht 62.5 in | Wt 144.0 lb

## 2012-09-27 DIAGNOSIS — K219 Gastro-esophageal reflux disease without esophagitis: Secondary | ICD-10-CM

## 2012-09-27 DIAGNOSIS — H538 Other visual disturbances: Secondary | ICD-10-CM

## 2012-09-27 DIAGNOSIS — E119 Type 2 diabetes mellitus without complications: Secondary | ICD-10-CM

## 2012-09-27 LAB — CBC
HCT: 41.7 % (ref 36.0–46.0)
Hemoglobin: 14.5 g/dL (ref 12.0–15.0)
MCH: 28.8 pg (ref 26.0–34.0)
MCHC: 34.8 g/dL (ref 30.0–36.0)
MCV: 82.9 fL (ref 78.0–100.0)
Platelets: 259 10*3/uL (ref 150–400)
RBC: 5.03 MIL/uL (ref 3.87–5.11)
RDW: 13.4 % (ref 11.5–15.5)
WBC: 6.2 10*3/uL (ref 4.0–10.5)

## 2012-09-27 LAB — POCT GLYCOSYLATED HEMOGLOBIN (HGB A1C): Hemoglobin A1C: 7.5

## 2012-09-27 MED ORDER — PANTOPRAZOLE SODIUM 40 MG PO TBEC: 40.0000 mg | DELAYED_RELEASE_TABLET | Freq: Two times a day (BID) | ORAL | Status: DC

## 2012-09-27 MED ORDER — METFORMIN HCL 500 MG PO TABS: 1000.0000 mg | ORAL_TABLET | Freq: Two times a day (BID) | ORAL | Status: DC

## 2012-09-27 MED ORDER — SUCRALFATE 1 G PO TABS: 1.0000 g | ORAL_TABLET | Freq: Four times a day (QID) | ORAL | Status: DC

## 2012-09-27 NOTE — Assessment & Plan Note (Signed)
Patient with long history of GERD previously seen by GI and evaluated by surgeon for this issue many years ago (>10 yrs). In continuing to have issues with reflux and pain associated with this, though is better after increasing PPI and adding carafate. Now with concerning symptoms for stricture, ?related malignant transformation of esophageal tissue following years of reflux.  Plan: patient needs to see GI to be evaluated for this issue. No GI in [place] accepts the orange card so patient will have to be referred to [facility]. Have increased protonix to 40 mg BID and continued carafate as this helped patient. Advised to keep mobic to a minimum (max of 2x/month as this helps with her joint pain). Additionally patient advised that she is due for a colonoscopy and this would be beneficial given associated abdominal tenderness, though most likely related to upper GI issue.

## 2012-09-27 NOTE — Progress Notes (Addendum)
  Subjective:    Patient ID: Veronica Shaw, female    DOB: 12/03/1950, 62 y.o.   MRN: [id number]  HPI Patient is a 62 yo female who presents for f/u of GERD and DM.  GERD: has had for a long time. Recently seen for this issue by Dr. Gwenlyn Saran as the patient presented with worsening abdominal pain that patient thought had been related to her heart. She had an EKG at that time that revealed no abnormalities and had a myoview last May that was read as normal. Dr. Gwenlyn Saran stated this pain was likely related to her GERD and gave her a Rx for sulcralfate. Additionally on protonix once daily. Has had great improvement in pain. Upon further questioning the patient states that she frequently gets food stuck in her throat and has it come back up after eating. Endorses a little burning sensation in chest, pain in epigastric region, and sour taste in the back of her mouth. Endorses worsened symptoms with eating citrus and spicy foods. Has trouble swallowing solids. States previously saw GI who did an EGD and states they said she had bad reflux signs in esophagus and referred her to a surgeon who recommended surgical intervention. The patient declined this at that time due to the long recovery period. Additionally patient notes having blood with wiping several months ago. Is due for colonoscopy and when discussed states she would prefer to not have one given that her husband had to spend 4 weeks in the hospital and have abdominal surgery.  DM: taking metformin 500 mg BID. No issues with this medication. Last A1c was 6.9. Additionally complaining of blurry vision that is new in onset. Patient wears glasses. Asking to see ophthalmologist.  Review of Systems see HPI     Objective:   Physical Exam  Constitutional: She appears well-developed and well-nourished.  HENT:  Head: Normocephalic and atraumatic.  Cardiovascular: Normal rate, regular rhythm and normal heart sounds.   Pulmonary/Chest: Effort normal and breath sounds  normal.  Abdominal: Soft. She exhibits no distension and no mass. There is tenderness (mostly over epigastric region, mild TTP in RLQ). There is no rebound and no guarding.  Musculoskeletal: She exhibits no edema.  Neurological: She is alert.  Optic fundi normal appearance with ophthalmoscope   Skin: Skin is warm and dry.  Psychiatric:  Anxious   BP 146/83  Pulse 103  Ht 5' 2.5" (1.588 m)  Wt 144 lb (65.318 kg)  BMI 25.9 kg/m2  SpO2 95%    Assessment & Plan:

## 2012-09-27 NOTE — Assessment & Plan Note (Signed)
Vision increasingly blurry over the past month, worse in the left eye. Plan: will refer to opthalmology for evaluation of diabetic retinopathy

## 2012-09-27 NOTE — Patient Instructions (Signed)
Nice to see you again today. I put in referrals for you to see GI and the eye doctor. Someone will contact you about scheduling those appointments. I also increased your metformin dose to 1000 mg daily. A new prescription was sent to your pharmacy.

## 2012-09-27 NOTE — Assessment & Plan Note (Signed)
A1c of 8.1 in office. Plan: increase metformin to 1000 mg BID. Additionally refer to optho for evaluation for diabetic retinopathy given worsening vision.

## 2012-09-29 ENCOUNTER — Encounter: Payer: Self-pay | Admitting: Family Medicine

## 2012-10-02 ENCOUNTER — Telehealth: Payer: Self-pay | Admitting: *Deleted

## 2012-10-02 DIAGNOSIS — K219 Gastro-esophageal reflux disease without esophagitis: Secondary | ICD-10-CM

## 2012-10-02 MED ORDER — PANTOPRAZOLE SODIUM 40 MG PO TBEC: 40.0000 mg | DELAYED_RELEASE_TABLET | Freq: Two times a day (BID) | ORAL | Status: DC

## 2012-10-02 NOTE — Telephone Encounter (Signed)
Message copied by Jose Persia on Tue Oct 02, 2012  4:18 PM ------      Message from: Damita Lack      Created: Tue Oct 02, 2012  4:09 PM       Ms. Lorenda Hatchet is asking to change providers.  Not comfortable with Dr. Birdie Sons.  Saw Dr. Gwenlyn Saran a few weeks ago and really liked her.       She states she has been coming here for years and has never asked to change doctors.  I told her I would send you a message in regards to this.            Lupita Leash ------

## 2012-10-02 NOTE — Telephone Encounter (Signed)
[name] left a voicemail on my line requesting Rx for her Protonix 40 mg be sent to [facility].  Went to pick it up at [facility] and it was going to cost her $47, where as she can get it for free at the [facility] office.  Also requesting copy of her labs be mailed to her.  Called in prescription for Protonix 40 mg to [facility].  Copy of recent labs mailed to [name].  Ileana Ladd

## 2012-12-07 ENCOUNTER — Telehealth: Payer: Self-pay | Admitting: Family Medicine

## 2012-12-07 DIAGNOSIS — K219 Gastro-esophageal reflux disease without esophagitis: Secondary | ICD-10-CM

## 2012-12-07 NOTE — Telephone Encounter (Signed)
Patient is calling because back on 2/19, she was prescribed a medication for her stomach that she had to take for 2 weeks.  I believe it was Carafate, but the patient could not remember the name.  She said that yesterday [facility] called and has said that she is scheduled to have an Endoscopy in June and she needs to continue that medication until her Endoscopy and she needs a refill on it.  I attempted to explain that [facility] should then prescribe it for her, but she seemed very confused.

## 2012-12-08 MED ORDER — SUCRALFATE 1 G PO TABS: 1.0000 g | ORAL_TABLET | Freq: Four times a day (QID) | ORAL | Status: DC

## 2012-12-08 MED ORDER — PANTOPRAZOLE SODIUM 40 MG PO TBEC: 40.0000 mg | DELAYED_RELEASE_TABLET | Freq: Two times a day (BID) | ORAL | Status: DC

## 2012-12-08 NOTE — Telephone Encounter (Signed)
Reviewed patients records from [facility] in Care Everywhere. It does appear that they would like her to take carafate 1 g qid and protonix 40 mg BID until patient is seen for EGD and colonoscopy. Please phone in these prescriptions and call patient and advise that I have filled these for the patient. Thanks.

## 2012-12-10 ENCOUNTER — Other Ambulatory Visit: Payer: Self-pay | Admitting: Family Medicine

## 2012-12-10 DIAGNOSIS — K219 Gastro-esophageal reflux disease without esophagitis: Secondary | ICD-10-CM

## 2012-12-10 MED ORDER — SUCRALFATE 1 G PO TABS: 1.0000 g | ORAL_TABLET | Freq: Four times a day (QID) | ORAL | Status: DC

## 2012-12-10 NOTE — Telephone Encounter (Signed)
I was asked to print a paper prescription for Sucralfate for patient to take with discount card in order to obtain medication.  Paper prescription given to [facility]. Paula Compton, MD

## 2012-12-10 NOTE — Telephone Encounter (Signed)
Pt presented to clinic for Rx.  Had Dr. Mauricio Po write it for me.  Given to patient. [name], Veronica Shaw

## 2012-12-10 NOTE — Telephone Encounter (Signed)
Dr. Birdie Sons,  1. Called in protonix, but will need Rx for carafate placed up front.  2. Carafate is not avaliable at the [facility] and is $98 at [facility].  I contacted [facility] again and they have a discount card that the pt can take around to other pharmacies to see if the med will be cheaper.  3.  Called pt and she will pick up protonix and discount card.  Advised her to stop by here and pick up the rx to take to the pharmacy.  Pt agreable. Veronica Shaw, Maryjo Rochester

## 2012-12-14 ENCOUNTER — Telehealth: Payer: Self-pay | Admitting: Cardiology

## 2012-12-14 NOTE — Telephone Encounter (Signed)
New Problem:    Called in wanting to discuss the patient's nitroGLYCERIN (NITROSTAT) 0.4 MG SL tablet.  Please call back.

## 2012-12-14 NOTE — Telephone Encounter (Signed)
Left message to call back  

## 2012-12-20 NOTE — Telephone Encounter (Signed)
Have left messages without a return call to discuss further

## 2013-01-05 ENCOUNTER — Other Ambulatory Visit: Payer: Self-pay | Admitting: Cardiology

## 2013-01-07 NOTE — Telephone Encounter (Signed)
..   Requested Prescriptions   Pending Prescriptions Disp Refills  . diltiazem (CARDIZEM CD) 120 MG 24 hr capsule [Pharmacy Med Name: DILTIAZEM 120MG  CD  CAP] 30 capsule 6    Sig: TAKE ONE CAPSULE BY MOUTH EVERY DAY

## 2013-01-11 LAB — HM COLONOSCOPY

## 2013-02-07 ENCOUNTER — Other Ambulatory Visit: Payer: Self-pay

## 2013-02-13 ENCOUNTER — Other Ambulatory Visit: Payer: Self-pay | Admitting: *Deleted

## 2013-02-13 DIAGNOSIS — K219 Gastro-esophageal reflux disease without esophagitis: Secondary | ICD-10-CM

## 2013-02-14 ENCOUNTER — Other Ambulatory Visit: Payer: Self-pay | Admitting: Family Medicine

## 2013-02-14 MED ORDER — DESLORATADINE 5 MG PO TABS: 5.0000 mg | ORAL_TABLET | Freq: Every day | ORAL | Status: DC

## 2013-02-14 MED ORDER — SUCRALFATE 1 G PO TABS: 1.0000 g | ORAL_TABLET | Freq: Four times a day (QID) | ORAL | Status: DC

## 2013-02-14 NOTE — Telephone Encounter (Signed)
Refilled carafate. Appears there is a phone note from previously with this patient requesting to switch to Veronica Shaw. Has this happened? If so could we change this in the computer so Veronica Shaw gets these refill requests. Thanks.

## 2013-02-18 ENCOUNTER — Encounter: Payer: Self-pay | Admitting: Sports Medicine

## 2013-02-18 ENCOUNTER — Ambulatory Visit (INDEPENDENT_AMBULATORY_CARE_PROVIDER_SITE_OTHER): Payer: No Typology Code available for payment source | Admitting: Sports Medicine

## 2013-02-18 VITALS — BP 136/76 | HR 96 | Ht 62.5 in | Wt 144.0 lb

## 2013-02-18 DIAGNOSIS — M25579 Pain in unspecified ankle and joints of unspecified foot: Secondary | ICD-10-CM

## 2013-02-18 MED ORDER — GABAPENTIN 100 MG PO CAPS: ORAL_CAPSULE | ORAL | Status: DC

## 2013-02-18 NOTE — Telephone Encounter (Signed)
PCP changed to Dr. Gwenlyn Saran.  Ileana Ladd

## 2013-02-18 NOTE — Progress Notes (Signed)
  Subjective:    Patient ID: Veronica Shaw, female    DOB: [birth year], 62 y.o.   MRN: [id number]  HPI chief complaint: Bilateral foot numbness and pain  62 year old female comes in today complaining of several weeks of bilateral foot numbness and pain. Symptoms began after she noticed some swelling in each of her ankles. She has been taking some of her husbands Lasix which has helped with the swelling and in turn has helped with her symptoms but it has not been curative. She describes a burning type pain and a numbness which is diffuse around the ankle and radiates into the medial foot. Her foot is too uncomfortable to squeeze into a shoe. She has a history of plantar fasciitis and was given some green sports insoles by Dr. Jennette Kettle several months ago. Current pain is different in nature to what she experienced at that time. She denies any trauma. Symptoms are constantly present and do not appear to be affected by activity.  Past medical history is reviewed. History is significant for diabetic peripheral neuropathy and a history of previous lumbar radiculopathy. Medications are reviewed Allergies are reviewed    Review of Systems     Objective:   Physical Exam Well-developed, well-nourished. No acute distress. Awake alert and oriented x3. Vital signs are reviewed  Examination of each foot shows no obvious soft tissue swelling. There is decreased sensation to light touch diffusely around each ankle and into the dorsum of each foot. No bony or soft tissue tenderness to palpation. Skin is normal color and warm to touch. Negative Tinel's over the tarsal tunnel bilaterally. Good dorsalis pedis and posterior tibial pulses. Patient walks with a slight limp.       Assessment & Plan:  1. Diffuse bilateral foot pain and numbness of questionable etiology  I'm going to try her on Neurontin 100 mg each bedtime titrating up to 300 mg each bedtime over the next week or so. She has an appointment with her  primary care physician sometime in the next couple of weeks. Since her symptoms are started with some edema and that has since resolved with using her husbands Lasix I think that her primary care physician should consider ruling out medical causes of dependent edema. I do think she would benefit from using the green sports insoles which were given to her by Dr. Jennette Kettle and I'll see the patient back in the office in 3 weeks. If symptoms persist I would consider merits of an EEG/nerve conduction study.

## 2013-02-20 ENCOUNTER — Other Ambulatory Visit: Payer: Self-pay | Admitting: Family Medicine

## 2013-02-20 NOTE — Progress Notes (Signed)
Refill sent for neurontin 300mg  qhs. 90 caps, one refill  Marena Chancy, PGY-3 Family Medicine Resident

## 2013-02-21 ENCOUNTER — Other Ambulatory Visit: Payer: Self-pay | Admitting: *Deleted

## 2013-03-01 ENCOUNTER — Encounter: Payer: Self-pay | Admitting: Family Medicine

## 2013-03-01 ENCOUNTER — Ambulatory Visit (INDEPENDENT_AMBULATORY_CARE_PROVIDER_SITE_OTHER): Payer: No Typology Code available for payment source | Admitting: Family Medicine

## 2013-03-01 ENCOUNTER — Telehealth: Payer: Self-pay | Admitting: *Deleted

## 2013-03-01 VITALS — BP 159/84 | HR 101 | Ht 62.5 in | Wt 140.0 lb

## 2013-03-01 DIAGNOSIS — R600 Localized edema: Secondary | ICD-10-CM

## 2013-03-01 DIAGNOSIS — R251 Tremor, unspecified: Secondary | ICD-10-CM

## 2013-03-01 DIAGNOSIS — E119 Type 2 diabetes mellitus without complications: Secondary | ICD-10-CM

## 2013-03-01 DIAGNOSIS — R259 Unspecified abnormal involuntary movements: Secondary | ICD-10-CM

## 2013-03-01 DIAGNOSIS — K219 Gastro-esophageal reflux disease without esophagitis: Secondary | ICD-10-CM

## 2013-03-01 DIAGNOSIS — R2689 Other abnormalities of gait and mobility: Secondary | ICD-10-CM

## 2013-03-01 DIAGNOSIS — R29818 Other symptoms and signs involving the nervous system: Secondary | ICD-10-CM

## 2013-03-01 DIAGNOSIS — R609 Edema, unspecified: Secondary | ICD-10-CM

## 2013-03-01 DIAGNOSIS — I10 Essential (primary) hypertension: Secondary | ICD-10-CM

## 2013-03-01 LAB — CBC
HCT: 40.1 % (ref 36.0–46.0)
Hemoglobin: 14 g/dL (ref 12.0–15.0)
MCH: 29 pg (ref 26.0–34.0)
MCHC: 34.9 g/dL (ref 30.0–36.0)
MCV: 83.2 fL (ref 78.0–100.0)
Platelets: 226 10*3/uL (ref 150–400)
RBC: 4.82 MIL/uL (ref 3.87–5.11)
RDW: 13.3 % (ref 11.5–15.5)
WBC: 5 10*3/uL (ref 4.0–10.5)

## 2013-03-01 LAB — BASIC METABOLIC PANEL
BUN: 15 mg/dL (ref 6–23)
CO2: 28 mEq/L (ref 19–32)
Calcium: 9.5 mg/dL (ref 8.4–10.5)
Chloride: 102 mEq/L (ref 96–112)
Creat: 0.71 mg/dL (ref 0.50–1.10)
Glucose, Bld: 136 mg/dL — ABNORMAL HIGH (ref 70–99)
Potassium: 3.8 mEq/L (ref 3.5–5.3)
Sodium: 139 mEq/L (ref 135–145)

## 2013-03-01 LAB — TSH: TSH: 1.455 u[IU]/mL (ref 0.350–4.500)

## 2013-03-01 LAB — VITAMIN B12: Vitamin B-12: 680 pg/mL (ref 211–911)

## 2013-03-01 LAB — RPR

## 2013-03-01 LAB — POCT GLYCOSYLATED HEMOGLOBIN (HGB A1C): Hemoglobin A1C: 6.8

## 2013-03-01 MED ORDER — LOSARTAN POTASSIUM 50 MG PO TABS: 50.0000 mg | ORAL_TABLET | Freq: Every day | ORAL | Status: DC

## 2013-03-01 NOTE — Patient Instructions (Addendum)
Continue with the increased dose of metformin,.  Continue taking the sucralfate and the prilosec.  Follow up with me in 3 weeks.

## 2013-03-01 NOTE — Telephone Encounter (Signed)
[facility] cannot obtain Cozaar for free but can get Benicar - would like to change if possible - if so please send new script to health dept. Wyatt Haste, RN-BSN

## 2013-03-03 DIAGNOSIS — R42 Dizziness and giddiness: Secondary | ICD-10-CM | POA: Insufficient documentation

## 2013-03-03 DIAGNOSIS — R251 Tremor, unspecified: Secondary | ICD-10-CM | POA: Insufficient documentation

## 2013-03-03 DIAGNOSIS — R600 Localized edema: Secondary | ICD-10-CM | POA: Insufficient documentation

## 2013-03-03 NOTE — Assessment & Plan Note (Signed)
A1C of 6.8 today from 7.5. Continue with metformin 1000mg  bid.

## 2013-03-03 NOTE — Assessment & Plan Note (Signed)
Tremor at rest mostly on right side. Concerning for PArkinson's. Will follow up on neurology's recommendations.

## 2013-03-03 NOTE — Assessment & Plan Note (Signed)
Not present on exam for today's visit. Recommended that patient come to clinic if it were to reoccur. Will check BMP for kidney function. lexiscan from 11/2011 showed LV Ejection Fraction: 58%. LV Wall Motion: NL LV Function; NL Wall Motion. If continues to be a problem, will obtain repeat echo. Monitor for now.

## 2013-03-03 NOTE — Assessment & Plan Note (Addendum)
Normal EGD on 01/11/13. Continue sucralfate and prilosec

## 2013-03-03 NOTE — Assessment & Plan Note (Signed)
[facility] no longer covering diovan. Will switch to other ARB and patient to follow up in 3 weeks for repeat BP.

## 2013-03-03 NOTE — Assessment & Plan Note (Addendum)
Associated with tremor, weakness in right lower extremity. Will check reversible causes such as B12, RPR, TSH. Patient has appointment with neurology at [facility] on August 8th. Will hold on any brain imaging until her evaluation with neurology. With decreased strength and sensation, may benefit from nerve conduction study.

## 2013-03-03 NOTE — Progress Notes (Signed)
Patient ID: Veronica Shaw    DOB: 08-08-1950, 62 y.o.   MRN: [id number] --- Subjective:  Veronica Shaw is a 62 y.o.female with h/o DM2, HTN, HLD, GERD, who presents for follow up on diabetes and acid reflux but has other concerns as well.  - loss of balance and tremor: tremor has been present for a few years but worst recently. It occurs mostly at rest on right hand. She says that it only occasionally bother her with eating. She has had some associated loss of balance where she veers to one side. Worst at night when she can't see. She bumps herself on furniture. She also states that she feels weak in her legs bilaterally with muscle pain in the thighs and and calves, right more than left. She also reports feeling more forgetful. She has stopped driving because of all of this.   - acid reflux: improved on prilosec and sucralfate. Breathing problems from it have significantly improved. She had EGD and colonoscopy done at [facility] which were normal. No significant abdominal pain, nausea or vomiting.   - diabetes: takes metformin 1000mg  bid. No hypoglycemia. does't check CBG's. Numbness and tingling in lower extremities for which she was started on gabapentin recently which has helped.   - lower extremity swelling: started 1 month ago for which she took her husband's lasix. For 2 weeks has been much better, off of lasix. It's worst if she is more on her feet. Goes away with elevation. No shortness of breath. No PND, sleeps with pillows due to her asthma.   ROS: see HPI Past Medical History: reviewed and updated medications and allergies. Social History: Tobacco: passive smoker exposure but never smoker.   Objective: Filed Vitals:   03/01/13 0915  BP: 159/84  Pulse: 101    Physical Examination:   General appearance - alert, well appearing, and in no distress Neuro - CN2-12 grossly intact, right hand tremor at rest, not present on left. Normal finger to nose bilaterally. Normal gait for a few feet. Some  swaying with romberg with eyes closed but not complete loss of balance.  Sensation to microfilament decreased along right and left big toes, loss of sensation along lateral aspect of right ankle.  Decreased strength with right knee flexion and extension and plantar flexion compared to left.  Chest - clear to auscultation, no wheezes, rales or rhonchi, symmetric air entry Heart - normal rate, regular rhythm, normal S1, S2, 2/6 flow systolic murmur best heard along right sternal border.  Extremities - peripheral pulses normal, no pedal edema, no clubbing or cyanosis

## 2013-03-04 ENCOUNTER — Encounter: Payer: Self-pay | Admitting: Family Medicine

## 2013-03-05 ENCOUNTER — Telehealth: Payer: Self-pay | Admitting: Family Medicine

## 2013-03-05 MED ORDER — OLMESARTAN MEDOXOMIL 20 MG PO TABS: 20.0000 mg | ORAL_TABLET | Freq: Every day | ORAL | Status: DC

## 2013-03-05 NOTE — Telephone Encounter (Signed)
Will send Rx for olmesartan 20mg  daily 30 tabs, 3 refills and [facility] to [facility].   Marena Chancy, PGY-3 Family Medicine Resident

## 2013-03-11 ENCOUNTER — Ambulatory Visit: Payer: No Typology Code available for payment source | Admitting: Sports Medicine

## 2013-03-13 ENCOUNTER — Ambulatory Visit (INDEPENDENT_AMBULATORY_CARE_PROVIDER_SITE_OTHER): Payer: No Typology Code available for payment source | Admitting: Sports Medicine

## 2013-03-13 VITALS — BP 145/7 | Ht 62.0 in | Wt 144.0 lb

## 2013-03-13 DIAGNOSIS — M5416 Radiculopathy, lumbar region: Secondary | ICD-10-CM

## 2013-03-13 DIAGNOSIS — IMO0002 Reserved for concepts with insufficient information to code with codable children: Secondary | ICD-10-CM

## 2013-03-13 NOTE — Progress Notes (Signed)
  Subjective:    Patient ID: Veronica Shaw, female    DOB: 05-Feb-1951, 62 y.o.   MRN: [id number]  HPI Patient comes in today for followup. Still complaining of diffuse numbness, tingling, and burning type pain in her feet. No real improvement with green sports insoles. The swelling she was complaining of previously has resolved. Since her last office visit she has seen a neurologist who has diagnosed her with early onset Parkinson's disease.    Review of Systems     Objective:   Physical Exam Well-developed, well-nourished. No acute distress  Examination of each foot and ankle shows no gross deformity. No swelling. No erythema. No atrophy. Neurological exam shows reflexes to be 1/4 at the Achilles and patellar tendons bilaterally. Her strength is 5/5 both lower extremities. Positive Tinel's over the tarsal tunnel on the right, negative on the left. Decreased sensation to light-touch in her feet. Good dorsalis pedis and posterior tibial pulses. Walking with a slight limp.       Assessment & Plan:  1. Bilateral foot/ankle pain and numbness-rule out lumbar radiculopathy versus tarsal tunnel syndrome versus diabetic neuropathy  Patient has known diabetic neuropathy. I'm going to order an EMG/nerve conduction study to help differentiate between diabetic neuropathy, tarsal tunnel syndrome, and possible lumbar radiculopathy. Phone followup after that study at which point we will delineate further treatment. In the meantime continue Neurontin at night. I think she should continue in her green sports insoles as well.

## 2013-03-14 ENCOUNTER — Ambulatory Visit: Payer: No Typology Code available for payment source

## 2013-03-27 ENCOUNTER — Encounter: Payer: Self-pay | Admitting: Physical Medicine & Rehabilitation

## 2013-03-27 ENCOUNTER — Ambulatory Visit (INDEPENDENT_AMBULATORY_CARE_PROVIDER_SITE_OTHER): Payer: No Typology Code available for payment source | Admitting: Family Medicine

## 2013-03-27 ENCOUNTER — Encounter: Payer: Self-pay | Admitting: Family Medicine

## 2013-03-27 VITALS — BP 149/76 | HR 96 | Wt 139.0 lb

## 2013-03-27 DIAGNOSIS — I10 Essential (primary) hypertension: Secondary | ICD-10-CM

## 2013-03-27 DIAGNOSIS — F329 Major depressive disorder, single episode, unspecified: Secondary | ICD-10-CM

## 2013-03-27 MED ORDER — SERTRALINE HCL 50 MG PO TABS: 50.0000 mg | ORAL_TABLET | Freq: Every day | ORAL | Status: DC

## 2013-03-27 NOTE — Patient Instructions (Addendum)
Follow up 3 weeks after starting to take the zoloft.

## 2013-03-31 DIAGNOSIS — F329 Major depressive disorder, single episode, unspecified: Secondary | ICD-10-CM

## 2013-03-31 HISTORY — DX: Major depressive disorder, single episode, unspecified: F32.9

## 2013-03-31 NOTE — Progress Notes (Signed)
Patient ID: Veronica Shaw    DOB: 11/21/1950, 62 y.o.   MRN: [id number] --- Subjective:  Veronica Shaw is a 62 y.o.female with h/o HTN, DM2 and newly diagnosed Parkinson's who presents for follow up.  - Hypertension: she has not been able to fill benicar [facility], currently still taking valstartan 160mg . She denies any chest pain, shortness of breath or significant lower extremity swelling  - mood: she reports that in the last few weeks she has been increasingly moody and unhappy. She states that she looses her temper often. She has lost interest in doing things like going to church. She feels like she is a burden on her husband and daughter because she is no longer able to do certain things (like drive) with the limitations from Parkinson's. She denies any plan of hurting herself but does state that she feels like she would be better off dead. Her family is what keeps her from any action. She denies a plan or intent to kill herself.   She states that a couple of times in her life, around traumatic times like the death of her mother, she became depressed and was started on zoloft which helped.  She denies any symptoms of mania in the past.    ROS: see HPI Past Medical History: reviewed and updated medications and allergies. Social History: Tobacco: passive smoker  Objective: Filed Vitals:   03/27/13 0846  BP: 149/76  Pulse: 96    Physical Examination:   General appearance - alert, well groomed, tearful when talking about her feelings and new diagnosis of Parkinson's.  Chest - clear to auscultation, no wheezes, rales or rhonchi, symmetric air entry Heart - normal rate, regular rhythm, normal S1, S2, no murmurs Extremities - no edema.

## 2013-03-31 NOTE — Assessment & Plan Note (Signed)
Not quite at goal for diabetes: repeat BP today: 140/90.  Since she is going to switch ARBs soon, will not adjust anything.  Patient to follow up in 3 weeks. Will check BP at that time and adjust accordingly.

## 2013-03-31 NOTE — Assessment & Plan Note (Signed)
Likely in corelation with new diagnosis of Parkinson's. PHQ9 of 23.  Will start zoloft 50mg  daily. Patient denies any plan or intent to harm herself.  - follow up in 3 weeks from start of taking zoloft - call clinic or 911 if suicidal ideation arises.  - patient not interested in therapy at this time.

## 2013-04-20 ENCOUNTER — Emergency Department (HOSPITAL_COMMUNITY): Payer: No Typology Code available for payment source

## 2013-04-20 ENCOUNTER — Encounter (HOSPITAL_COMMUNITY): Payer: Self-pay | Admitting: Emergency Medicine

## 2013-04-20 ENCOUNTER — Emergency Department (INDEPENDENT_AMBULATORY_CARE_PROVIDER_SITE_OTHER): Payer: No Typology Code available for payment source

## 2013-04-20 ENCOUNTER — Emergency Department (HOSPITAL_COMMUNITY)
Admission: EM | Admit: 2013-04-20 | Discharge: 2013-04-20 | Disposition: A | Discharge status: Nursing Home (Includes ICF) | Payer: No Typology Code available for payment source | Source: Home / Self Care | Attending: Family Medicine | Admitting: Family Medicine

## 2013-04-20 ENCOUNTER — Telehealth (HOSPITAL_COMMUNITY): Payer: Self-pay | Admitting: *Deleted

## 2013-04-20 ENCOUNTER — Emergency Department (HOSPITAL_COMMUNITY)
Admission: EM | Admit: 2013-04-20 | Discharge: 2013-04-20 | Disposition: A | Discharge status: Home / Self Care | Payer: No Typology Code available for payment source | Attending: Emergency Medicine | Admitting: Emergency Medicine

## 2013-04-20 ENCOUNTER — Encounter (HOSPITAL_COMMUNITY): Payer: Self-pay

## 2013-04-20 DIAGNOSIS — Z8739 Personal history of other diseases of the musculoskeletal system and connective tissue: Secondary | ICD-10-CM | POA: Insufficient documentation

## 2013-04-20 DIAGNOSIS — J189 Pneumonia, unspecified organism: Secondary | ICD-10-CM

## 2013-04-20 DIAGNOSIS — Z8719 Personal history of other diseases of the digestive system: Secondary | ICD-10-CM | POA: Insufficient documentation

## 2013-04-20 DIAGNOSIS — E119 Type 2 diabetes mellitus without complications: Secondary | ICD-10-CM | POA: Insufficient documentation

## 2013-04-20 DIAGNOSIS — R0602 Shortness of breath: Secondary | ICD-10-CM

## 2013-04-20 DIAGNOSIS — Z872 Personal history of diseases of the skin and subcutaneous tissue: Secondary | ICD-10-CM | POA: Insufficient documentation

## 2013-04-20 DIAGNOSIS — Z79899 Other long term (current) drug therapy: Secondary | ICD-10-CM | POA: Insufficient documentation

## 2013-04-20 DIAGNOSIS — R059 Cough, unspecified: Secondary | ICD-10-CM

## 2013-04-20 DIAGNOSIS — R05 Cough: Secondary | ICD-10-CM

## 2013-04-20 DIAGNOSIS — R5381 Other malaise: Secondary | ICD-10-CM | POA: Insufficient documentation

## 2013-04-20 DIAGNOSIS — J159 Unspecified bacterial pneumonia: Secondary | ICD-10-CM | POA: Insufficient documentation

## 2013-04-20 DIAGNOSIS — Z880 Allergy status to penicillin: Secondary | ICD-10-CM | POA: Insufficient documentation

## 2013-04-20 DIAGNOSIS — J029 Acute pharyngitis, unspecified: Secondary | ICD-10-CM | POA: Insufficient documentation

## 2013-04-20 DIAGNOSIS — J45909 Unspecified asthma, uncomplicated: Secondary | ICD-10-CM | POA: Insufficient documentation

## 2013-04-20 LAB — BASIC METABOLIC PANEL
BUN: 11 mg/dL (ref 6–23)
CO2: 28 mEq/L (ref 19–32)
Calcium: 9.6 mg/dL (ref 8.4–10.5)
Chloride: 99 mEq/L (ref 96–112)
Creatinine, Ser: 0.7 mg/dL (ref 0.50–1.10)
GFR calc Af Amer: >90 mL/min (ref >90)
GFR calc non Af Amer: 90 mL/min — ABNORMAL LOW (ref >90)
Glucose, Bld: 123 mg/dL — ABNORMAL HIGH (ref 70–99)
Potassium: 3.6 mEq/L (ref 3.5–5.1)
Sodium: 137 mEq/L (ref 135–145)

## 2013-04-20 LAB — CBC
HCT: 43.2 % (ref 36.0–46.0)
Hemoglobin: 15.1 g/dL — ABNORMAL HIGH (ref 12.0–15.0)
MCH: 30.3 pg (ref 26.0–34.0)
MCHC: 35 g/dL (ref 30.0–36.0)
MCV: 86.7 fL (ref 78.0–100.0)
Platelets: 211 K/uL (ref 150–400)
RBC: 4.98 MIL/uL (ref 3.87–5.11)
RDW: 12.6 % (ref 11.5–15.5)
WBC: 11.9 K/uL — ABNORMAL HIGH (ref 4.0–10.5)

## 2013-04-20 LAB — PRO B NATRIURETIC PEPTIDE: Pro B Natriuretic peptide (BNP): 578.7 pg/mL — ABNORMAL HIGH (ref 0–125)

## 2013-04-20 LAB — D-DIMER, QUANTITATIVE: D-Dimer, Quant: 0.63 ug/mL-FEU — ABNORMAL HIGH (ref 0.00–0.48)

## 2013-04-20 LAB — POCT I-STAT TROPONIN I: Troponin i, poc: 0 ng/mL (ref 0.00–0.08)

## 2013-04-20 MED ORDER — ACETAMINOPHEN 325 MG PO TABS
650.0000 mg | ORAL_TABLET | Freq: Once | ORAL | Status: AC
  Administered 2013-04-20: 650 mg via ORAL
  Filled 2013-04-20: qty 2

## 2013-04-20 MED ORDER — IOHEXOL 350 MG/ML SOLN
80.0000 mL | Freq: Once | INTRAVENOUS | Status: AC | PRN
  Administered 2013-04-20: 80 mL via INTRAVENOUS

## 2013-04-20 MED ORDER — LEVOFLOXACIN 500 MG PO TABS: 500.0000 mg | ORAL_TABLET | Freq: Every day | ORAL | Status: DC

## 2013-04-20 MED ORDER — HYDROCODONE-ACETAMINOPHEN 5-325 MG PO TABS: 1.0000 | ORAL_TABLET | ORAL | Status: DC | PRN

## 2013-04-20 MED ORDER — METHYLPREDNISOLONE SODIUM SUCC 125 MG IJ SOLR
125.0000 mg | Freq: Once | INTRAMUSCULAR | Status: AC
  Administered 2013-04-20: 125 mg via INTRAVENOUS
  Filled 2013-04-20: qty 2

## 2013-04-20 MED ORDER — DILTIAZEM HCL 25 MG/5ML IV SOLN
10.0000 mg | Freq: Once | INTRAVENOUS | Status: AC
  Administered 2013-04-20: 10 mg via INTRAVENOUS
  Filled 2013-04-20: qty 5

## 2013-04-20 MED ORDER — SODIUM CHLORIDE 0.9 % IV BOLUS (SEPSIS)
1000.0000 mL | Freq: Once | INTRAVENOUS | Status: AC
  Administered 2013-04-20: 1000 mL via INTRAVENOUS

## 2013-04-20 MED ORDER — ALBUTEROL SULFATE (5 MG/ML) 0.5% IN NEBU
2.5000 mg | INHALATION_SOLUTION | RESPIRATORY_TRACT | Status: DC | PRN
  Administered 2013-04-20: 2.5 mg via RESPIRATORY_TRACT
  Filled 2013-04-20: qty 0.5

## 2013-04-20 MED ORDER — LEVOFLOXACIN 750 MG PO TABS
750.0000 mg | ORAL_TABLET | Freq: Every day | ORAL | Status: DC
  Administered 2013-04-20: 750 mg via ORAL
  Filled 2013-04-20: qty 1

## 2013-04-20 NOTE — ED Notes (Signed)
Pt placed on cardiac monitor [HR 121], supplemental O2 [spO2 96%]

## 2013-04-20 NOTE — ED Provider Notes (Signed)
CSN: [id number]     Arrival date & time 04/20/13  1230 History   First MD Initiated Contact with Patient 04/20/13 1248     Chief Complaint  Patient presents with  . Cough  . Chest Pain    HPI  62 year old female. Started about 2 weeks ago of feeling of cold symptoms. Sore throat. Cough. To 3 days ago started feeling some right posterior inferior chest pain. Presented to the urgent care. Had a sinus tachycardia. Had a left bundle branch block that is new versus June of this year. She has a long history of sinus tachycardia.  Sinus tachycardia in his previous on beta blockers, and Cardizem. She is no longer taking Lopressor,because she states that it makes her feel poorly.  She had to be placed on a lower dosage of Cardizem because of fatigue.She does not have any GI symptoms.  . She is having no dysuria.  Past Medical History  Diagnosis Date  . Sleep apnea     No CPAP  . Gastritis   . Plantar fasciitis   . Diabetes mellitus   . Hyperlipidemia   . Asthma   . Urticaria    Past Surgical History  Procedure Laterality Date  . Tubal ligation    . Cholecystectomy    . Bunionectomy     Family History  Problem Relation Age of Onset  . Coronary artery disease Father     Died age 3  . Coronary artery disease Mother     Died age 6   History  Substance Use Topics  . Smoking status: Passive Smoke Exposure - Never Smoker  . Smokeless tobacco: Not on file     Comment: exposed to smoke during child hood (parents)  . Alcohol Use: Not on file   OB History   Grav Para Term Preterm Abortions TAB SAB Ect Mult Living                 Review of Systems  Constitutional: Positive for fatigue. Negative for fever, chills, diaphoresis and appetite change.  HENT: Negative for sore throat, mouth sores and trouble swallowing.   Eyes: Negative for visual disturbance.  Respiratory: Positive for cough and shortness of breath. Negative for chest tightness and wheezing.   Cardiovascular: Positive for  chest pain. Negative for leg swelling.  Gastrointestinal: Negative for nausea, vomiting, abdominal pain, diarrhea and abdominal distention.  Endocrine: Negative for polydipsia, polyphagia and polyuria.  Genitourinary: Negative for dysuria, frequency, hematuria and flank pain.  Musculoskeletal: Negative for gait problem.  Skin: Negative for color change, pallor and rash.  Neurological: Negative for dizziness, syncope, light-headedness and headaches.  Hematological: Does not bruise/bleed easily.  Psychiatric/Behavioral: Negative for behavioral problems and confusion.    Allergies  Aspirin; Penicillins; Prempro; Ace inhibitors; and Simvastatin  Home Medications   Current Outpatient Rx  Name  Route  Sig  Dispense  Refill  . albuterol (PROVENTIL HFA;VENTOLIN HFA) 108 (90 BASE) MCG/ACT inhaler   Inhalation   Inhale 2 puffs into the lungs every 6 (six) hours as needed for wheezing.         . cyclobenzaprine (FLEXERIL) 5 MG tablet   Oral   Take 1 tablet (5 mg total) by mouth 3 (three) times daily as needed for muscle spasms.   30 tablet   1   . diltiazem (CARDIZEM CD) 120 MG 24 hr capsule   Oral   Take 120 mg by mouth daily.         [name] Kitchen EPINEPHrine (  EPIPEN) 0.3 mg/0.3 mL DEVI   Intramuscular   Inject 0.3 mLs (0.3 mg total) into the muscle once. Dispense pen.  Instruct in use   2 Device   1   . fish oil-omega-3 fatty acids 1000 MG capsule   Oral   Take 2 g by mouth daily.         [name] Kitchen gabapentin (NEURONTIN) 100 MG capsule   Oral   Take 100-200 mg by mouth at bedtime as needed (for muscle pain).         [name] Kitchen lansoprazole (PREVACID) 15 MG capsule   Oral   Take 15 mg by mouth daily.         . meloxicam (MOBIC) 7.5 MG tablet   Oral   Take 7.5 mg by mouth daily as needed for pain.         . metFORMIN (GLUCOPHAGE) 500 MG tablet   Oral   Take 2 tablets (1,000 mg total) by mouth 2 (two) times daily with a meal.   180 tablet   4   . montelukast (SINGULAIR) 10 MG tablet    Oral   Take 1 tablet (10 mg total) by mouth at bedtime.   30 tablet   3   . nitroGLYCERIN (NITROSTAT) 0.4 MG SL tablet   Sublingual   Place 1 tablet (0.4 mg total) under the tongue every 5 (five) minutes as needed for chest pain. Take one tablet and place under tongue if develop chest pain may repeat x2   25 tablet   2   . sertraline (ZOLOFT) 50 MG tablet   Oral   Take 1 tablet (50 mg total) by mouth daily.   30 tablet   3   . sucralfate (CARAFATE) 1 G tablet   Oral   Take 1 g by mouth 2 (two) times daily.         . valsartan (DIOVAN) 160 MG tablet   Oral   Take 1 tablet (160 mg total) by mouth daily.   30 tablet   11   . HYDROcodone-acetaminophen (NORCO/VICODIN) 5-325 MG per tablet   Oral   Take 1 tablet by mouth every 4 (four) hours as needed for pain.   10 tablet   0   . levofloxacin (LEVAQUIN) 500 MG tablet   Oral   Take 1 tablet (500 mg total) by mouth daily.   10 tablet   0    BP 140/64  Pulse 124  Temp(Src) 100.7 F (38.2 C) (Oral)  Resp 18  SpO2 96% Physical Exam  Constitutional: She is oriented to person, place, and time. She appears well-developed and well-nourished. No distress.  Painful since this. Not dyspneic. Awake alert and oriented.  HENT:  Head: Normocephalic.  Eyes: Conjunctivae are normal. Pupils are equal, round, and reactive to light. No scleral icterus.  Neck: Normal range of motion. Neck supple. No thyromegaly present.  Cardiovascular: Regular rhythm, S1 normal, S2 normal and normal heart sounds.  Tachycardia present.  Exam reveals no gallop and no friction rub.   No murmur heard. No  S3 or S4 gallop.  Pulmonary/Chest: Effort normal and breath sounds normal. No respiratory distress. She has no wheezes. She has no rales.  No crackles or rales or diminished breath sounds.  Abdominal: Soft. Bowel sounds are normal. She exhibits no distension. There is no tenderness. There is no rebound.    Musculoskeletal: Normal range of motion.    Neurological: She is alert and oriented to person, place, and time.  Skin: Skin is warm and dry. No rash noted.  Psychiatric: She has a normal mood and affect. Her behavior is normal.    ED Course  Procedures (including critical care time)  EKG. Sinus tachycardia. (2 out. The block is new versus 614.   Labs Review Labs Reviewed  CBC - Abnormal; Notable for the following:    WBC 11.9 (*)    Hemoglobin 15.1 (*)    All other components within normal limits  BASIC METABOLIC PANEL - Abnormal; Notable for the following:    Glucose, Bld 123 (*)    GFR calc non Af Amer 90 (*)    All other components within normal limits  PRO B NATRIURETIC PEPTIDE - Abnormal; Notable for the following:    Pro B Natriuretic peptide (BNP) 578.7 (*)    All other components within normal limits  D-DIMER, QUANTITATIVE - Abnormal; Notable for the following:    D-Dimer, Quant 0.63 (*)    All other components within normal limits  POCT I-STAT TROPONIN I   Imaging Review Dg Chest 2 View  04/20/2013   *RADIOLOGY REPORT*  Clinical Data: Cough, right chest pain  CHEST - 2 VIEW  Comparison: September 19, 2012  Findings: There is a small stable calcified granuloma in the lateral left lung base unchanged.  Calcified left hilar lymph node is unchanged.  There is no focal infiltrate, pulmonary edema, or pleural effusion.  The mediastinal contour and cardiac silhouette are normal.  The soft tissues and osseous structures are stable.  IMPRESSION: No acute cardiopulmonary disease identified.  Old granulomatous disease unchanged.   Original Report Authenticated By: Sherian Rein, M.D.   Ct Angio Chest Pe W/cm &/or Wo Cm  04/20/2013   CLINICAL DATA:  Chest pain and cough.  Abnormal EKG.  EXAM: CT ANGIOGRAPHY CHEST WITH CONTRAST  TECHNIQUE: Multidetector CT imaging of the chest was performed using the standard protocol during bolus administration of intravenous contrast. Multiplanar CT image reconstructions including MIPs were  obtained to evaluate the vascular anatomy.  CONTRAST:  80mL OMNIPAQUE IOHEXOL 350 MG/ML SOLN  COMPARISON:  04/20/2013  FINDINGS: No pleural effusion identified. Calcified granuloma is noted in the left lower lobe. Solid nodule in the right lower lobe measures 3 mm, image 75/ series 6. Mild lower lobe stress set there is mild, posterior basal areas of ground-glass attenuation which likely reflects dependent changes. No airspace consolidation identified. The heart size appears within normal limits. No pericardial effusion identified. 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 The main pulmonary artery up stress set the main pulmonary artery appears patent. Calcified left hilar lymph node is identified. Right hilar lymph node is identified measuring 1.1 cm, image 49/series 4. No axillary or supraclavicular adenopathy.  Calcified granuloma noted within the right hepatic lobe. The patient is status post cholecystectomy.  Review of the visualized osseous structures is on unremarkable.  Review of the MIP images confirms the above findings.  IMPRESSION: 1. No evidence for acute pulmonary embolus. 2. Prior granulomatous disease 3. 3 mm nodule is identified in the right lower lobe. If the patient is at high risk for bronchogenic carcinoma, follow-up chest CT at 1year is recommended. If the patient is at low risk, no follow-up is needed. This recommendation follows the consensus statement: Guidelines for Management of Small Pulmonary Nodules Detected on CT Scans: A Statement from the Fleischner Society as published in Radiology 2005; 237:395-400. 4. Borderline enlarged right hilar lymph node measures 1.1 cm.  Electronically Signed   By: Signa Kell M.D.   On: 04/20/2013 15:53    MDM   1. Community acquired pneumonia    She has a sinus tachycardia chest pain. She's not hypoxemic. She is wheezing. She is given albuterol and steroids. Awaiting d-dimer. Chest x-ray shows no  acute abnormalities.   D-dimer slightly elevated. Number CT angiogram obtained. No PE. Per my reading does show some basilar atelectasis versus infiltrate. The radiologist no PE. Also noted by radiology as a 3 mm inferior lobe nodule. I made her aware of this finding. She is [facility] Mr. cancerous she is a lifetime nonsmoker. Astra follow this up with her primary care physician regarding the need for additional followup test at 1 year respiratory on his recommendation. Think her symptoms are consistent with a pulmonary infection. She has a fever she has a cough she has subtle abnormalities on chest CT her head she has no desire to stay in the hospital. Discussed with our resource of a high. She's had taken her Cardizem reviewed one dose IV here. Will resume her Cardizem starting tomorrow. Given by mouth Levaquin. Discharged home. Diagnosis right lobe pneumonia. Next number tachycardia-visible today and historically tachycardic.  Pulmonary nodule in need for followup as above.  She has chest pain in the left and a new left bundle branch block. However her pain is right-sided pleuritic and present for over 48 hours. Her first reported as normal. Do not feel this by history, or EKG represents acute coronary syndrome.   Roney Marion, MD 04/20/13 [id number]

## 2013-04-20 NOTE — ED Notes (Signed)
EKG completed 1238

## 2013-04-20 NOTE — ED Notes (Signed)
Coughing and right side pain since yesterday, patient states a lot of production with cough (green in color)

## 2013-04-20 NOTE — ED Provider Notes (Signed)
CSN: [id number]     Arrival date & time 04/20/13  0865 History   First MD Initiated Contact with Patient 04/20/13 1013     Chief Complaint  Patient presents with  . Cough    Patient is a 62 y.o. female presenting with cough. The history is provided by the patient.  Cough Cough characteristics:  Productive Sputum characteristics:  Green Severity:  Moderate Onset quality:  Sudden Duration:  24 hours Timing:  Constant Progression:  Worsening Chronicity:  New Smoker: no   Relieved by:  None tried Worsened by:  Activity Associated symptoms: chills, shortness of breath and sinus congestion   Associated symptoms: no chest pain, no diaphoresis, no ear fullness, no fever, no headaches, no myalgias, no rhinorrhea, no weight loss and no wheezing   Pt is a 62 y/o female w/ PMHx of DM, hyperlipidemia, ST, Asthma and newly diagnosed Parkinson's Disease reports that 2 days ago she began to feel like she was "coming down with something". She felt very tired and had a tickle in her throat that made her cough. Yesterday symptoms acutely worsened to include persistent harsh cough productive of green sputum. She has felt "feverish". She has also had periods in last 24 hrs where she has felt slightly confused. She also began to have (R) sided lateral rib area sharp pain that radiates to her (R) upper back. The pain is worse with coughing, deep breathing and lying on her side. Pt states she is always somewhat SOB with activity which is not new but has worsened overnight. She does not smoke. She does admit that she has to sleep in a reclining position but has done so for several yrs. Denies known h/o CHF or A-Fib but admits she is on Cardizem for "fast heart rate". She is also supposed to take Atenolol but states she does take it regularly because it makes her feel bad. Denies CP, N/V/D or abd pain.  Past Medical History  Diagnosis Date  . Sleep apnea     No CPAP  . Gastritis   . Plantar fasciitis   .  Diabetes mellitus   . Hyperlipidemia   . Asthma   . Urticaria    Past Surgical History  Procedure Laterality Date  . Tubal ligation    . Cholecystectomy    . Bunionectomy     Family History  Problem Relation Age of Onset  . Coronary artery disease Father     Died age 57  . Coronary artery disease Mother     Died age 85   History  Substance Use Topics  . Smoking status: Passive Smoke Exposure - Never Smoker  . Smokeless tobacco: Not on file     Comment: exposed to smoke during child hood (parents)  . Alcohol Use: Not on file   OB History   Grav Para Term Preterm Abortions TAB SAB Ect Mult Living                 Review of Systems  Constitutional: Positive for chills. Negative for fever, weight loss and diaphoresis.  HENT: Positive for postnasal drip. Negative for rhinorrhea.   Eyes: Negative.   Respiratory: Positive for cough and shortness of breath. Negative for chest tightness, wheezing and stridor.   Cardiovascular: Negative for chest pain and leg swelling.  Gastrointestinal: Negative.   Endocrine: Negative.   Genitourinary: Negative.   Musculoskeletal: Negative for myalgias.  Allergic/Immunologic: Negative.   Neurological: Positive for tremors. Negative for headaches.  Hematological: Negative.  Psychiatric/Behavioral: Negative.      Allergies  Penicillins; Prempro; Ace inhibitors; and Simvastatin  Home Medications   Current Outpatient Rx  Name  Route  Sig  Dispense  Refill  . albuterol (PROVENTIL) (2.5 MG/3ML) 0.083% nebulizer solution   Nebulization   Take 3 mLs (2.5 mg total) by nebulization 4 (four) times daily as needed.   75 mL   0   . albuterol (VENTOLIN HFA) 108 (90 BASE) MCG/ACT inhaler   Inhalation   Inhale 2 puffs into the lungs every 4 (four) hours as needed.   1 each   1   . cyclobenzaprine (FLEXERIL) 5 MG tablet   Oral   Take 1 tablet (5 mg total) by mouth 3 (three) times daily as needed for muscle spasms.   30 tablet   1   .  desloratadine (CLARINEX) 5 MG tablet   Oral   Take 1 tablet (5 mg total) by mouth daily.   90 tablet   2   . diltiazem (CARDIZEM CD) 120 MG 24 hr capsule      TAKE ONE CAPSULE BY MOUTH EVERY DAY   30 capsule   6   . EPINEPHrine (EPIPEN) 0.3 mg/0.3 mL DEVI   Intramuscular   Inject 0.3 mLs (0.3 mg total) into the muscle once. Dispense pen.  Instruct in use   2 Device   1   . fish oil-omega-3 fatty acids 1000 MG capsule   Oral   Take 2 g by mouth daily.         [name] Kitchen gabapentin (NEURONTIN) 100 MG capsule      Take 1 capsule by mouth at bedtime for 2 nights, then 2 capsules by mouth at bedtime for 2 nights, and then take 3 at bedtime each night   120 capsule   1   . meloxicam (MOBIC) 7.5 MG tablet   Oral   Take 1 tablet (7.5 mg total) by mouth daily.   30 tablet   3   . metFORMIN (GLUCOPHAGE) 500 MG tablet   Oral   Take 2 tablets (1,000 mg total) by mouth 2 (two) times daily with a meal.   180 tablet   4   . nitroGLYCERIN (NITROSTAT) 0.4 MG SL tablet   Sublingual   Place 1 tablet (0.4 mg total) under the tongue every 5 (five) minutes as needed for chest pain. Take one tablet and place under tongue if develop chest pain may repeat x2   25 tablet   2   . sertraline (ZOLOFT) 50 MG tablet   Oral   Take 1 tablet (50 mg total) by mouth daily.   30 tablet   3   . sucralfate (CARAFATE) 1 G tablet   Oral   Take 1 tablet (1 g total) by mouth 4 (four) times daily.   120 tablet   1   . traMADol (ULTRAM) 50 MG tablet   Oral   Take 1 tablet (50 mg total) by mouth every 8 (eight) hours as needed for pain.   90 tablet   2   . valsartan (DIOVAN) 160 MG tablet   Oral   Take 1 tablet (160 mg total) by mouth daily.   30 tablet   11   . aspirin 81 MG tablet   Oral   Take 81 mg by mouth daily.           [name] Kitchen EXPIRED: beclomethasone (QVAR) 80 MCG/ACT inhaler   Inhalation   Inhale 2 puffs into the  lungs 2 (two) times daily.   1 Inhaler   12   .  HYDROcodone-acetaminophen (NORCO) 10-325 MG per tablet   Oral   Take 1 tablet by mouth every 8 (eight) hours as needed for pain.   20 tablet   0   . EXPIRED: montelukast (SINGULAIR) 10 MG tablet   Oral   Take 1 tablet (10 mg total) by mouth at bedtime.   30 tablet   3   . pantoprazole (PROTONIX) 40 MG tablet   Oral   Take 1 tablet (40 mg total) by mouth 2 (two) times daily.   60 tablet   1   . EXPIRED: pravastatin (PRAVACHOL) 20 MG tablet   Oral   Take 1 tablet (20 mg total) by mouth daily.   30 tablet   0    BP 162/85  Pulse 124  Temp(Src) 98.4 F (36.9 C) (Oral)  Resp 24  SpO2 97% Physical Exam  Nursing note and vitals reviewed. Constitutional: She is oriented to person, place, and time. She appears well-developed and well-nourished. She appears ill.  HENT:  Head: Normocephalic and atraumatic.  Eyes: Conjunctivae are normal.  Neck: Neck supple.  Cardiovascular: Regular rhythm.  Tachycardia present.   Pulmonary/Chest: Breath sounds normal.  Mildly tachypneac  Musculoskeletal:  Hands w/ tremors  Neurological: She is alert and oriented to person, place, and time.  Skin: Skin is warm and dry.  Psychiatric: She has a normal mood and affect.    ED Course  Procedures (including critical care time) Labs Review Labs Reviewed - No data to display Imaging Review Dg Chest 2 View  04/20/2013   *RADIOLOGY REPORT*  Clinical Data: Cough, right chest pain  CHEST - 2 VIEW  Comparison: September 19, 2012  Findings: There is a small stable calcified granuloma in the lateral left lung base unchanged.  Calcified left hilar lymph node is unchanged.  There is no focal infiltrate, pulmonary edema, or pleural effusion.  The mediastinal contour and cardiac silhouette are normal.  The soft tissues and osseous structures are stable.  IMPRESSION: No acute cardiopulmonary disease identified.  Old granulomatous disease unchanged.   Original Report Authenticated By: Sherian Rein, M.D.    MDM   No diagnosis found. 24 hr h/o productive harsh cough associated w/ (R) sided rib abd upper back pain that sounds pleuritic in nature. Some intermittent confusion. Increased SOB with exertion that has worsened overnight. Sats upon arrival to room 90-92% which were up to 98% on r/a w/ rest. CXR neg for acute findings. EKG reveals ST (115) new LBBB and a widened QRS. Discussed with Dr Artis Flock. Will send to [facility] for further evaluation.     Leanne Chang, NP 04/20/13 1155

## 2013-04-20 NOTE — ED Notes (Addendum)
Pt arrived from [facility] by [facility]. Pt c/o CP and cough. EKG from [facility] noted new Left bundle branch block. Pt also stated that her balance has been off. Taking a deep breath provokes pain in chest to right rib cage. VS  bp-158/76 hr-124

## 2013-04-21 ENCOUNTER — Telehealth (HOSPITAL_COMMUNITY): Payer: Self-pay | Admitting: Emergency Medicine

## 2013-04-21 NOTE — ED Notes (Signed)
Pt LVM requesting change of Levaquin Rx due to cost.  Pts information [place] on CM [place] [id number] for f/u.

## 2013-04-21 NOTE — Progress Notes (Signed)
   CARE MANAGEMENT NOTE 04/21/2013  Patient:  Veronica Shaw, Veronica Shaw   Account Number:  [id number]  Date Initiated:  04/21/2013  Documentation initiated by:  [facility]  Subjective/Objective Assessment:   pneumonia     Action/Plan:   lives at home with husband   Anticipated DC Date:  04/20/2013   Anticipated DC Plan:  HOME/SELF CARE      DC Planning Services  CM consult  Medication Assistance  MATCH Program      Choice offered to / List presented to:             Status of service:  Completed, signed off Medicare Important Message given?   (If response is "NO", the following Medicare IM given date fields will be blank) Date Medicare IM given:   Date Additional Medicare IM given:    Discharge Disposition:  HOME/SELF CARE  Per UR Regulation:    If discussed at Long Length of Stay Meetings, dates discussed:    Comments:  04/21/2013 1030 NCM spoke to pt and states she is umemployed with no insurance. States Levaquin was $100 and she cannot afford at this time. Explained MATCH program (can use once per year and $3.00 copay) and requested Rx be faxed to [facility] on [address] # [phone]. Contacted pharmacy to explain NCM will fax letter and pt is to pick up today. Isidoro Donning RN CCM Case Mgmt phone [phone]

## 2013-04-22 NOTE — Progress Notes (Signed)
Patient had called [facility] ED With questions regarding the cost of her Levaquin-medication patients reports she can not afford one hundred dollars for her medication.Patient information checked In EPIC.Match guidelines explained.Patient aware MATCH information faxed to [facility].Patient will collect her medication and is aware of three dollar co-pay.No further case Surveyor, minerals Needs at this time.Patient has verbalized her understanding of information provided today.

## 2013-04-24 NOTE — ED Provider Notes (Signed)
Medical screening examination/treatment/procedure(s) were performed by resident physician or non-physician practitioner and as supervising physician I was immediately available for consultation/collaboration.   Jensyn Cambria DOUGLAS MD.   Paulmichael Schreck D Kamaiyah Uselton, MD 04/24/13 1455 

## 2013-04-29 ENCOUNTER — Other Ambulatory Visit: Payer: Self-pay | Admitting: Family Medicine

## 2013-05-01 DIAGNOSIS — G20A1 Parkinson's disease without dyskinesia, without mention of fluctuations: Secondary | ICD-10-CM | POA: Insufficient documentation

## 2013-05-01 DIAGNOSIS — G20 Parkinson's disease: Secondary | ICD-10-CM | POA: Insufficient documentation

## 2013-05-03 ENCOUNTER — Ambulatory Visit (INDEPENDENT_AMBULATORY_CARE_PROVIDER_SITE_OTHER): Payer: No Typology Code available for payment source | Admitting: Family Medicine

## 2013-05-03 ENCOUNTER — Telehealth: Payer: Self-pay | Admitting: Family Medicine

## 2013-05-03 ENCOUNTER — Encounter: Payer: Self-pay | Admitting: Family Medicine

## 2013-05-03 VITALS — BP 145/88 | HR 102 | Temp 98.9°F | Ht 62.0 in | Wt 136.0 lb

## 2013-05-03 DIAGNOSIS — J209 Acute bronchitis, unspecified: Secondary | ICD-10-CM | POA: Insufficient documentation

## 2013-05-03 DIAGNOSIS — Z23 Encounter for immunization: Secondary | ICD-10-CM

## 2013-05-03 MED ORDER — DOXYCYCLINE HYCLATE 100 MG PO TABS: 100.0000 mg | ORAL_TABLET | Freq: Two times a day (BID) | ORAL | Status: DC

## 2013-05-03 MED ORDER — PREDNISONE 20 MG PO TABS: 40.0000 mg | ORAL_TABLET | Freq: Every day | ORAL | Status: DC

## 2013-05-03 MED ORDER — GUAIFENESIN-CODEINE 100-10 MG/5ML PO SYRP: 5.0000 mL | ORAL_SOLUTION | Freq: Three times a day (TID) | ORAL | Status: DC | PRN

## 2013-05-03 NOTE — Patient Instructions (Signed)
It was a pleasure to meet you today.  As we discussed, I am basing my treatment more on your symptoms and your relation of your previous symptoms than your exam, which is actually very good today.   For the asthmatic bronchitis, I am prescribing:  Prednisone (steroid) 20mg  tablets, take 2 tablets by mouth one time daily, for 7 days.  Doxycycline 100mg  tablets, take 1 tablet by mouth twice daily for 10 days.  Cheratussin syrup at bedtime as needed for cough.   If your symptoms are not improving in the next 2 days, you may get a chest x-ray before your follow up next week (If you feel better, do not go for the x-ray).   FOLLOW UP WITH DR Gwenlyn Saran IN THE COMING 7-10 DAYS.

## 2013-05-03 NOTE — Telephone Encounter (Signed)
Patient cannot afford antibiotics that were sent in for her this morning. Needs something cheaper. Please call patient when completed.

## 2013-05-03 NOTE — Assessment & Plan Note (Signed)
Patient presents with cough/exertional dyspnea/burning in central chest that is reminiscent of prior asthma exacerbations.  She does not use albuterol out of concern for tachycardia.  Has remarked that prior use of systemic steroids (last used over 2 yrs ago) has helped her.  Not in respiratory distress or showing signs of hypoxia. Plan to treat with oral prednisone short course, as well as doxycycline 100mg  bid for 7 days.  Plan for close follow up to be scheduled with her primary physician.

## 2013-05-03 NOTE — Telephone Encounter (Signed)
Will fwd. To Dr.Breen for review. .Veronica Shaw   

## 2013-05-03 NOTE — Progress Notes (Signed)
  Subjective:    Patient ID: Veronica Shaw, female    DOB: [birth year], 62 y.o.   MRN: [id number]  HPI Patient seen today for SDA, follow up from ED visit on Sept 20th which began in the urgent care and was triaged to the ED.  At that time she had presented for a 1-day history of dyspnea and pain along the R rib cage; had a positive D-dimer and a CTA that was negative for PE, however there was a concern by the clinicians in the ED for pneumonia and she was given a course of Levaquin for treatment of CAP.  TOday she reports that her R sided rib cage pain is resolved, but that she still has some dyspnea with exertion and a "burning" in central chest that has persisted.  Also with cough productive of thick sputum, nonbloody and worse at bedtime.  She compares this sensation to prior episodes of asthma/bronchitis flares.  She suffers from asthma, but tries not to use because of tachycardia.  Has used Qvar in the past but finds it irritates her breathing.  No fevers or chills.  Review of Systems     Objective:   Physical Exam Generally well-appearing, speaking fluidly in full sentences, appears mildly dyspneic with prolonged talking. Pulse oximetry on room air 96%.  HEENT Neck supple, no cervical adenopathy.   COR regular S1S2 PULM Clear lung fields bilaterally, no rales on exam. No visible skin changes       Assessment & Plan:

## 2013-05-04 NOTE — Telephone Encounter (Signed)
If patient is not improving with the other interventions that we instituted, she is to call back in 2 days and we will consider alternatives. JB

## 2013-05-06 NOTE — Telephone Encounter (Signed)
Pt called back. She feels better. Never took or picked up the ABX. Per Dr.Breen: Pt does not need to take the ABX, just has to finish the prednisone and f/up with her PCP, which is Dr.Losq. We got disconnected. Waiting for pt to call back. Lorenda Hatchet, Renato Battles

## 2013-05-06 NOTE — Telephone Encounter (Signed)
LMOM for pt to rt call 

## 2013-05-10 ENCOUNTER — Ambulatory Visit: Payer: No Typology Code available for payment source | Admitting: Family Medicine

## 2013-05-14 ENCOUNTER — Telehealth: Payer: Self-pay | Admitting: Family Medicine

## 2013-05-14 NOTE — Telephone Encounter (Signed)
Called pt. Unable to communicate due to 'noise' in line. Please tell pt, that we don't call in medications for yeast infection, without testing. Needs OV. Thanks. Lorenda Hatchet, Renato Battles

## 2013-05-14 NOTE — Telephone Encounter (Signed)
Patient been on antibiotics for 18 days and now has developed a yeast infection. Would like something called in. Please inform patient once completed.

## 2013-05-15 NOTE — Telephone Encounter (Signed)
Patient was informed this morning.

## 2013-05-20 ENCOUNTER — Telehealth: Payer: Self-pay | Admitting: *Deleted

## 2013-05-20 ENCOUNTER — Encounter: Payer: Self-pay | Admitting: Family Medicine

## 2013-05-20 ENCOUNTER — Ambulatory Visit (INDEPENDENT_AMBULATORY_CARE_PROVIDER_SITE_OTHER): Payer: No Typology Code available for payment source | Admitting: Family Medicine

## 2013-05-20 VITALS — BP 136/74 | HR 80 | Temp 98.2°F | Ht 62.0 in | Wt 137.0 lb

## 2013-05-20 DIAGNOSIS — R0689 Other abnormalities of breathing: Secondary | ICD-10-CM

## 2013-05-20 DIAGNOSIS — R0609 Other forms of dyspnea: Secondary | ICD-10-CM

## 2013-05-20 DIAGNOSIS — G20 Parkinson's disease: Secondary | ICD-10-CM

## 2013-05-20 DIAGNOSIS — R0989 Other specified symptoms and signs involving the circulatory and respiratory systems: Secondary | ICD-10-CM

## 2013-05-20 DIAGNOSIS — F329 Major depressive disorder, single episode, unspecified: Secondary | ICD-10-CM

## 2013-05-20 DIAGNOSIS — R0600 Dyspnea, unspecified: Secondary | ICD-10-CM

## 2013-05-20 DIAGNOSIS — K219 Gastro-esophageal reflux disease without esophagitis: Secondary | ICD-10-CM

## 2013-05-20 MED ORDER — SERTRALINE HCL 100 MG PO TABS: 100.0000 mg | ORAL_TABLET | Freq: Every day | ORAL | Status: DC

## 2013-05-20 MED ORDER — POLYETHYLENE GLYCOL 3350 17 GM/SCOOP PO POWD: 17.0000 g | Freq: Two times a day (BID) | ORAL | Status: DC | PRN

## 2013-05-20 NOTE — Telephone Encounter (Signed)
LMVM for pt to call back. Please tell pt about the appt. Thanks. Lorenda Hatchet, Glenna Brunkow  Scheduled 2-D Echo for pt. 05/29/13 at 2 pm. Arrive at 1:45 pm at [facility]. At [facility].

## 2013-05-20 NOTE — Addendum Note (Signed)
Addended byArlyss Repress on: 05/20/2013 11:14 AM   Modules accepted: Orders

## 2013-05-20 NOTE — Patient Instructions (Signed)
Like we talked about, for the constipation take miralax twice a day until you have a soft bowel movement. After that, take it once a day.   For the heartburn, start taking prilosec or nexium again.   For the shortness of breath, start back on the qvar every day. Also I am getting an ultrasound of your heart for shortness of breath.   For the depression, I am increasing the zoloft to 100mg  at night time.   Follow up with me in 3 weeks to go over the results of the echo and see how the zoloft is helping.

## 2013-05-21 ENCOUNTER — Telehealth: Payer: Self-pay

## 2013-05-21 DIAGNOSIS — G20A1 Parkinson's disease without dyskinesia, without mention of fluctuations: Secondary | ICD-10-CM | POA: Insufficient documentation

## 2013-05-21 DIAGNOSIS — G20 Parkinson's disease: Secondary | ICD-10-CM | POA: Insufficient documentation

## 2013-05-21 NOTE — Progress Notes (Signed)
Patient ID: Veronica Shaw    DOB: 1950-09-08, 62 y.o.   MRN: [id number] --- Subjective:  Veronica Shaw is a 62 y.o.female with h/o PArkinson's, depression, diabetes who presents for follow up on depression as well as for the following concerns:  - depression: on zoloft 50mg  daily.  Tolerability:  - side effects: headache in the beginning but has since resolved  - missed doses in 1 week: none Safety:  - NW:GNFA - HI: none Efficacy: - mood improvement: feels like she has more interest in things. Is happy spending time with her family and has less of a short temper.    - shortness of breath: started since she had pneumonia a few weeks ago. Becomes dyspneic after only a few steps. She has been taking albuterol up to 6 times per day. She wakes up short of breath and needs to take alnuterol. She has not been taking her qvar in 1 week.  She also reports needing 4 pillows to sleep at night and often sleeps in recliner. Also reports PND. She denies any chest pain.   - abdominal pain: associated with constipation. She has been having hard bowel movements and infrequently. She took milk of magnesia which helped.  She also has burning pain in epigastric region, worst with fried foods.   ROS: see HPI Past Medical History: reviewed and updated medications and allergies. Social History: Tobacco: none  Objective: Filed Vitals:   05/20/13 1036  BP: 136/74  Pulse: 80  Temp: 98.2 F (36.8 C)    Physical Examination:   General appearance - alert, well appearing, and in no distress Chest - clear to auscultation, no wheezes, rales or rhonchi, symmetric air entry Heart - normal rate, regular rhythm, normal S1, S2, no murmurs, rubs, clicks or gallops Abdomen - soft, nontender, nondistended, no masses or organomegaly Extremities - peripheral pulses normal, no pedal edema Neuro - tremor at rest right more than left.   PHQ9:  17 with very difficult

## 2013-05-21 NOTE — Assessment & Plan Note (Signed)
Recommended starting back on PPI

## 2013-05-21 NOTE — Assessment & Plan Note (Signed)
Patient feeling like zoloft is helping with her mood. She has had a modest reduction in phq9.  - increase zoloft t0 100mg  daily  - follow up in 3 weeks.

## 2013-05-21 NOTE — Assessment & Plan Note (Signed)
Continues to be a problem.  - echo since it has been one year since last one - start back qvar.  - if not improved, consider aggressive treatment of reflux symptoms and possible referral to GI if continues to be a problem.

## 2013-05-21 NOTE — Telephone Encounter (Signed)
Called pt and informed that her last TSH level was 1.455 on 03/01/2013, which is normal. Will fwd. To PCP for review. Lorenda Hatchet, Renato Battles

## 2013-05-21 NOTE — Telephone Encounter (Signed)
Called pt.

## 2013-05-21 NOTE — Telephone Encounter (Signed)
Informed pt of appt. Lorenda Hatchet, Renato Battles

## 2013-05-21 NOTE — Telephone Encounter (Signed)
Patient forgot to mention yesterday when she was in the office that her neurologist would like her to get her thyroid evaluated. She was on Synthroid 30+ years ago, but was taken off. Her hair has begin to fall out, with other symptoms.  Would like to have lab work done to check thyroid.

## 2013-05-29 ENCOUNTER — Ambulatory Visit (HOSPITAL_COMMUNITY)
Admission: RE | Admit: 2013-05-29 | Discharge: 2013-05-29 | Disposition: A | Discharge status: Home / Self Care | Payer: No Typology Code available for payment source | Source: Ambulatory Visit | Attending: Family Medicine | Admitting: Family Medicine

## 2013-05-29 ENCOUNTER — Encounter: Payer: Self-pay | Admitting: Family Medicine

## 2013-05-29 DIAGNOSIS — I209 Angina pectoris, unspecified: Secondary | ICD-10-CM | POA: Insufficient documentation

## 2013-05-29 DIAGNOSIS — R0609 Other forms of dyspnea: Secondary | ICD-10-CM | POA: Insufficient documentation

## 2013-05-29 DIAGNOSIS — I519 Heart disease, unspecified: Secondary | ICD-10-CM

## 2013-05-29 DIAGNOSIS — R0600 Dyspnea, unspecified: Secondary | ICD-10-CM

## 2013-05-29 DIAGNOSIS — R0989 Other specified symptoms and signs involving the circulatory and respiratory systems: Secondary | ICD-10-CM | POA: Insufficient documentation

## 2013-05-29 NOTE — Telephone Encounter (Signed)
Sent letter to patient with TSH levels and CBC and CMP results.  Veronica Shaw, PGY-3 Family Medicine Resident

## 2013-05-29 NOTE — Progress Notes (Signed)
  Echocardiogram 2D Echocardiogram has been performed.  Veronica Shaw 05/29/2013, 3:00 PM

## 2013-05-30 ENCOUNTER — Telehealth: Payer: Self-pay | Admitting: Family Medicine

## 2013-05-30 ENCOUNTER — Emergency Department (HOSPITAL_COMMUNITY): Payer: MEDICAID

## 2013-05-30 ENCOUNTER — Encounter (HOSPITAL_COMMUNITY): Payer: Self-pay | Admitting: Emergency Medicine

## 2013-05-30 ENCOUNTER — Other Ambulatory Visit: Payer: Self-pay | Admitting: Family Medicine

## 2013-05-30 ENCOUNTER — Observation Stay (HOSPITAL_COMMUNITY)
Admission: EM | Admit: 2013-05-30 | Discharge: 2013-06-01 | Disposition: A | Discharge status: Home / Self Care | Payer: MEDICAID | Attending: Family Medicine | Admitting: Family Medicine

## 2013-05-30 ENCOUNTER — Encounter: Payer: Self-pay | Admitting: Family Medicine

## 2013-05-30 DIAGNOSIS — I5030 Unspecified diastolic (congestive) heart failure: Secondary | ICD-10-CM

## 2013-05-30 DIAGNOSIS — E785 Hyperlipidemia, unspecified: Secondary | ICD-10-CM

## 2013-05-30 DIAGNOSIS — I428 Other cardiomyopathies: Secondary | ICD-10-CM | POA: Insufficient documentation

## 2013-05-30 DIAGNOSIS — G20 Parkinson's disease: Secondary | ICD-10-CM

## 2013-05-30 DIAGNOSIS — G20A1 Parkinson's disease without dyskinesia, without mention of fluctuations: Secondary | ICD-10-CM | POA: Insufficient documentation

## 2013-05-30 DIAGNOSIS — R0609 Other forms of dyspnea: Secondary | ICD-10-CM

## 2013-05-30 DIAGNOSIS — R0989 Other specified symptoms and signs involving the circulatory and respiratory systems: Secondary | ICD-10-CM

## 2013-05-30 DIAGNOSIS — R079 Chest pain, unspecified: Principal | ICD-10-CM

## 2013-05-30 DIAGNOSIS — I2510 Atherosclerotic heart disease of native coronary artery without angina pectoris: Secondary | ICD-10-CM | POA: Insufficient documentation

## 2013-05-30 DIAGNOSIS — J45909 Unspecified asthma, uncomplicated: Secondary | ICD-10-CM

## 2013-05-30 DIAGNOSIS — I10 Essential (primary) hypertension: Secondary | ICD-10-CM

## 2013-05-30 DIAGNOSIS — Z23 Encounter for immunization: Secondary | ICD-10-CM | POA: Insufficient documentation

## 2013-05-30 DIAGNOSIS — K219 Gastro-esophageal reflux disease without esophagitis: Secondary | ICD-10-CM | POA: Insufficient documentation

## 2013-05-30 DIAGNOSIS — E1159 Type 2 diabetes mellitus with other circulatory complications: Secondary | ICD-10-CM | POA: Diagnosis present

## 2013-05-30 DIAGNOSIS — I447 Left bundle-branch block, unspecified: Secondary | ICD-10-CM

## 2013-05-30 DIAGNOSIS — E118 Type 2 diabetes mellitus with unspecified complications: Secondary | ICD-10-CM | POA: Diagnosis present

## 2013-05-30 DIAGNOSIS — E119 Type 2 diabetes mellitus without complications: Secondary | ICD-10-CM

## 2013-05-30 DIAGNOSIS — Z79899 Other long term (current) drug therapy: Secondary | ICD-10-CM | POA: Insufficient documentation

## 2013-05-30 DIAGNOSIS — I498 Other specified cardiac arrhythmias: Secondary | ICD-10-CM

## 2013-05-30 DIAGNOSIS — R000 Tachycardia, unspecified: Secondary | ICD-10-CM

## 2013-05-30 DIAGNOSIS — R0600 Dyspnea, unspecified: Secondary | ICD-10-CM

## 2013-05-30 DIAGNOSIS — I509 Heart failure, unspecified: Secondary | ICD-10-CM

## 2013-05-30 DIAGNOSIS — I5022 Chronic systolic (congestive) heart failure: Secondary | ICD-10-CM | POA: Insufficient documentation

## 2013-05-30 DIAGNOSIS — I200 Unstable angina: Secondary | ICD-10-CM

## 2013-05-30 DIAGNOSIS — E876 Hypokalemia: Secondary | ICD-10-CM | POA: Insufficient documentation

## 2013-05-30 DIAGNOSIS — I152 Hypertension secondary to endocrine disorders: Secondary | ICD-10-CM | POA: Diagnosis present

## 2013-05-30 HISTORY — DX: Parkinson's disease: G20

## 2013-05-30 HISTORY — DX: Parkinson's disease without dyskinesia, without mention of fluctuations: G20.A1

## 2013-05-30 LAB — CBC
HCT: 41.6 % (ref 36.0–46.0)
Hemoglobin: 14.6 g/dL (ref 12.0–15.0)
MCH: 30.5 pg (ref 26.0–34.0)
MCHC: 35.1 g/dL (ref 30.0–36.0)
MCV: 87 fL (ref 78.0–100.0)
Platelets: 244 10*3/uL (ref 150–400)
RBC: 4.78 MIL/uL (ref 3.87–5.11)
RDW: 12.7 % (ref 11.5–15.5)
WBC: 6.1 10*3/uL (ref 4.0–10.5)

## 2013-05-30 LAB — BASIC METABOLIC PANEL
BUN: 18 mg/dL (ref 6–23)
CO2: 26 mEq/L (ref 19–32)
Calcium: 10 mg/dL (ref 8.4–10.5)
Chloride: 104 mEq/L (ref 96–112)
Creatinine, Ser: 0.83 mg/dL (ref 0.50–1.10)
GFR calc Af Amer: 85 mL/min — ABNORMAL LOW (ref >90)
GFR calc non Af Amer: 73 mL/min — ABNORMAL LOW (ref >90)
Glucose, Bld: 131 mg/dL — ABNORMAL HIGH (ref 70–99)
Potassium: 3.4 mEq/L — ABNORMAL LOW (ref 3.5–5.1)
Sodium: 142 mEq/L (ref 135–145)

## 2013-05-30 LAB — POCT I-STAT TROPONIN I: Troponin i, poc: 0.01 ng/mL (ref 0.00–0.08)

## 2013-05-30 LAB — PRO B NATRIURETIC PEPTIDE: Pro B Natriuretic peptide (BNP): 350.9 pg/mL — ABNORMAL HIGH (ref 0–125)

## 2013-05-30 LAB — GLUCOSE, CAPILLARY: Glucose-Capillary: 139 mg/dL — ABNORMAL HIGH (ref 70–99)

## 2013-05-30 MED ORDER — SERTRALINE HCL 100 MG PO TABS
100.0000 mg | ORAL_TABLET | Freq: Every day | ORAL | Status: DC
  Administered 2013-05-31 (×2): 100 mg via ORAL
  Filled 2013-05-30 (×3): qty 1

## 2013-05-30 MED ORDER — INSULIN ASPART 100 UNIT/ML ~~LOC~~ SOLN
0.0000 [IU] | Freq: Three times a day (TID) | SUBCUTANEOUS | Status: DC
  Administered 2013-05-31 – 2013-06-01 (×3): 1 [IU] via SUBCUTANEOUS

## 2013-05-30 MED ORDER — HEPARIN SODIUM (PORCINE) 5000 UNIT/ML IJ SOLN
5000.0000 [IU] | Freq: Three times a day (TID) | INTRAMUSCULAR | Status: DC
  Administered 2013-05-31 – 2013-06-01 (×4): 5000 [IU] via SUBCUTANEOUS
  Filled 2013-05-30 (×8): qty 1

## 2013-05-30 MED ORDER — SODIUM CHLORIDE 0.9 % IV SOLN: 250.0000 mL | INTRAVENOUS | Status: DC | PRN

## 2013-05-30 MED ORDER — POTASSIUM CHLORIDE CRYS ER 20 MEQ PO TBCR
20.0000 meq | EXTENDED_RELEASE_TABLET | Freq: Once | ORAL | Status: AC
  Administered 2013-05-30: 20 meq via ORAL
  Filled 2013-05-30: qty 1

## 2013-05-30 MED ORDER — ONDANSETRON HCL 4 MG/2ML IJ SOLN: 4.0000 mg | Freq: Four times a day (QID) | INTRAMUSCULAR | Status: DC | PRN

## 2013-05-30 MED ORDER — FLUTICASONE PROPIONATE HFA 44 MCG/ACT IN AERO
1.0000 | INHALATION_SPRAY | Freq: Two times a day (BID) | RESPIRATORY_TRACT | Status: DC
  Filled 2013-05-30 (×2): qty 10.6

## 2013-05-30 MED ORDER — SODIUM CHLORIDE 0.9 % IJ SOLN
3.0000 mL | Freq: Two times a day (BID) | INTRAMUSCULAR | Status: DC
  Administered 2013-05-30 – 2013-05-31 (×3): 3 mL via INTRAVENOUS

## 2013-05-30 MED ORDER — GABAPENTIN 100 MG PO CAPS
100.0000 mg | ORAL_CAPSULE | Freq: Every evening | ORAL | Status: DC | PRN
  Filled 2013-05-30: qty 2

## 2013-05-30 MED ORDER — ASPIRIN EC 81 MG PO TBEC: 81.0000 mg | DELAYED_RELEASE_TABLET | Freq: Every day | ORAL | Status: DC

## 2013-05-30 MED ORDER — OMEGA-3-ACID ETHYL ESTERS 1 G PO CAPS
1.0000 g | ORAL_CAPSULE | Freq: Two times a day (BID) | ORAL | Status: DC
  Administered 2013-05-31 – 2013-06-01 (×4): 1 g via ORAL
  Filled 2013-05-30 (×5): qty 1

## 2013-05-30 MED ORDER — ASPIRIN 300 MG RE SUPP
300.0000 mg | RECTAL | Status: DC
  Filled 2013-05-30: qty 1

## 2013-05-30 MED ORDER — INSULIN ASPART 100 UNIT/ML ~~LOC~~ SOLN: 0.0000 [IU] | Freq: Every day | SUBCUTANEOUS | Status: DC

## 2013-05-30 MED ORDER — MELOXICAM 7.5 MG PO TABS
7.5000 mg | ORAL_TABLET | Freq: Every day | ORAL | Status: DC | PRN
  Filled 2013-05-30: qty 1

## 2013-05-30 MED ORDER — CYCLOBENZAPRINE HCL 10 MG PO TABS: 5.0000 mg | ORAL_TABLET | Freq: Three times a day (TID) | ORAL | Status: DC | PRN

## 2013-05-30 MED ORDER — ASPIRIN 81 MG PO CHEW: 324.0000 mg | CHEWABLE_TABLET | ORAL | Status: DC

## 2013-05-30 MED ORDER — IRBESARTAN 150 MG PO TABS
150.0000 mg | ORAL_TABLET | Freq: Every day | ORAL | Status: DC
  Administered 2013-05-31 – 2013-06-01 (×2): 150 mg via ORAL
  Filled 2013-05-30 (×2): qty 1

## 2013-05-30 MED ORDER — ACETAMINOPHEN 325 MG PO TABS: 650.0000 mg | ORAL_TABLET | ORAL | Status: DC | PRN

## 2013-05-30 MED ORDER — CARBIDOPA-LEVODOPA 25-100 MG PO TABS
1.0000 | ORAL_TABLET | Freq: Three times a day (TID) | ORAL | Status: DC
  Administered 2013-05-31 – 2013-06-01 (×5): 1 via ORAL
  Filled 2013-05-30 (×7): qty 1

## 2013-05-30 MED ORDER — SUCRALFATE 1 G PO TABS
1.0000 g | ORAL_TABLET | Freq: Two times a day (BID) | ORAL | Status: DC
  Administered 2013-05-31 – 2013-06-01 (×4): 1 g via ORAL
  Filled 2013-05-30 (×5): qty 1

## 2013-05-30 MED ORDER — OMEGA-3 FATTY ACIDS 1000 MG PO CAPS: 1.0000 g | ORAL_CAPSULE | Freq: Two times a day (BID) | ORAL | Status: DC

## 2013-05-30 MED ORDER — SODIUM CHLORIDE 0.9 % IJ SOLN
3.0000 mL | INTRAMUSCULAR | Status: DC | PRN
  Administered 2013-05-31: 3 mL via INTRAVENOUS

## 2013-05-30 MED ORDER — DILTIAZEM HCL ER COATED BEADS 120 MG PO CP24
120.0000 mg | ORAL_CAPSULE | Freq: Every day | ORAL | Status: DC
  Filled 2013-05-30: qty 1

## 2013-05-30 MED ORDER — NITROGLYCERIN 0.4 MG SL SUBL: 0.4000 mg | SUBLINGUAL_TABLET | SUBLINGUAL | Status: DC | PRN

## 2013-05-30 MED ORDER — ATORVASTATIN CALCIUM 40 MG PO TABS
40.0000 mg | ORAL_TABLET | Freq: Every day | ORAL | Status: DC
  Administered 2013-05-31: 40 mg via ORAL
  Filled 2013-05-30 (×2): qty 1

## 2013-05-30 MED ORDER — METOPROLOL SUCCINATE ER 25 MG PO TB24: 12.5000 mg | ORAL_TABLET | Freq: Every day | ORAL | Status: DC

## 2013-05-30 MED ORDER — ALBUTEROL SULFATE HFA 108 (90 BASE) MCG/ACT IN AERS
2.0000 | INHALATION_SPRAY | Freq: Four times a day (QID) | RESPIRATORY_TRACT | Status: DC | PRN
  Filled 2013-05-30: qty 6.7

## 2013-05-30 NOTE — ED Notes (Addendum)
Pt reports having mid chest pain/tightness and sob for over one week. Pt had echo done today, was told to come here due to the cp/sob and new LBBB and EF 30-35%. ekg done at triage.

## 2013-05-30 NOTE — ED Notes (Signed)
Pt states she was sent to the hospital by her Dr following the results of an echocardiogram following having chest discomfort for over a month.  Pt states the pain is worse at night and comes with activity at times

## 2013-05-30 NOTE — ED Notes (Addendum)
Pt reports being put on Metoprolol post echocardiogram 12.5mg  but did not take it today. Reports ongoing chest pain x 1 month that increases with movement and when lying flat at night. Rates CP a 3/10 at this time but reports 6/10 when she went to Xray earlier in triage. States she was told to come to the ER to be admitted for observation.

## 2013-05-30 NOTE — ED Provider Notes (Signed)
CSN: [id number]     Arrival date & time 05/30/13  1829 History   First MD Initiated Contact with Patient 05/30/13 2003     Chief Complaint  Patient presents with  . Chest Pain  . Shortness of Breath   (Consider location/radiation/quality/duration/timing/severity/associated sxs/prior Treatment) HPI Comments: Veronica Shaw is a 62 y.o. Female who presents for evaluation of chest pain and abnormal EKG. She has been in contact with her doctor today, who has referred her to cardiology for evaluation of ongoing chest pain. Her doctor also ordered a cardiac echo that was done yesterday that showed congestive heart failure with diastolic dysfunction. Her doctor called in a prescription for metoprolol, but that has not been started yet. Later tonight. Her doctor asked her to come to the ED, because she was having ongoing chest pain and shortness of breath, primarily at night. She also has cough, productive of sputum, primarily at night. She denies fever, chills, nausea, vomiting, weakness, or dizziness. She feels she is about 5 pounds over her usual baseline weight of 135 pounds. She's never been treated for cardiac disease previously. She recently had a pneumonia. Her EKG showed  left bundle branch, when she was treated for pneumonia. Currently in emergency department. She has chest tightness that is mild, but no shortness of breath. There are no other known modifying factors.   Patient is a 62 y.o. female presenting with chest pain and shortness of breath. The history is provided by the patient.  Chest Pain Associated symptoms: shortness of breath   Shortness of Breath Associated symptoms: chest pain     Past Medical History  Diagnosis Date  . Sleep apnea     No CPAP  . Gastritis   . Plantar fasciitis   . Diabetes mellitus   . Hyperlipidemia   . Asthma   . Urticaria   . Irregular heart beat   . Parkinson disease    Past Surgical History  Procedure Laterality Date  . Tubal ligation    .  Cholecystectomy    . Bunionectomy     Family History  Problem Relation Age of Onset  . Coronary artery disease Father     Died age 40  . Coronary artery disease Mother     Died age 27   History  Substance Use Topics  . Smoking status: Passive Smoke Exposure - Never Smoker  . Smokeless tobacco: Not on file     Comment: exposed to smoke during child hood (parents)  . Alcohol Use: No   OB History   Grav Para Term Preterm Abortions TAB SAB Ect Mult Living                 Review of Systems  Respiratory: Positive for shortness of breath.   Cardiovascular: Positive for chest pain.  All other systems reviewed and are negative.    Allergies  Aspirin; Penicillins; Prempro; Ace inhibitors; and Simvastatin  Home Medications   Current Outpatient Rx  Name  Route  Sig  Dispense  Refill  . albuterol (PROVENTIL HFA;VENTOLIN HFA) 108 (90 BASE) MCG/ACT inhaler   Inhalation   Inhale 2 puffs into the lungs every 6 (six) hours as needed for wheezing.         [name] Kitchen albuterol (PROVENTIL) (2.5 MG/3ML) 0.083% nebulizer solution   Nebulization   Take 2.5 mg by nebulization daily as needed for wheezing or shortness of breath.         . beclomethasone (QVAR) 80 MCG/ACT inhaler  Inhalation   Inhale 1 puff into the lungs daily.         . carbidopa-levodopa (SINEMET) 25-100 MG per tablet   Oral   Take 1 tablet by mouth 3 (three) times daily.          . cyclobenzaprine (FLEXERIL) 5 MG tablet   Oral   Take 1 tablet (5 mg total) by mouth 3 (three) times daily as needed for muscle spasms.   30 tablet   1   . diltiazem (CARDIZEM CD) 120 MG 24 hr capsule   Oral   Take 120 mg by mouth daily.         [name] Kitchen EPINEPHrine (EPIPEN) 0.3 mg/0.3 mL SOAJ injection   Intramuscular   Inject 0.3 mg into the muscle as needed (allergic reaction).         . fish oil-omega-3 fatty acids 1000 MG capsule   Oral   Take 1 g by mouth 2 (two) times daily.          [name] Kitchen gabapentin (NEURONTIN) 100 MG  capsule   Oral   Take 100-200 mg by mouth at bedtime as needed (for muscle pain).         . meloxicam (MOBIC) 7.5 MG tablet   Oral   Take 7.5 mg by mouth daily as needed for pain.         . metFORMIN (GLUCOPHAGE) 500 MG tablet   Oral   Take 2 tablets (1,000 mg total) by mouth 2 (two) times daily with a meal.   180 tablet   4   . nitroGLYCERIN (NITROSTAT) 0.4 MG SL tablet   Sublingual   Place 1 tablet (0.4 mg total) under the tongue every 5 (five) minutes as needed for chest pain. Take one tablet and place under tongue if develop chest pain may repeat x2   25 tablet   2   . OVER THE COUNTER MEDICATION   Oral   Take 1 tablet by mouth daily. laxative         . PRESCRIPTION MEDICATION   Oral   Take 1 application by mouth daily as needed (for rosacea). Cream from medication assistance program         . sertraline (ZOLOFT) 100 MG tablet   Oral   Take 100 mg by mouth at bedtime.         . sucralfate (CARAFATE) 1 G tablet   Oral   Take 1 g by mouth 2 (two) times daily.         . valsartan (DIOVAN) 160 MG tablet   Oral   Take 1 tablet (160 mg total) by mouth daily.   30 tablet   11   . metoprolol succinate (TOPROL-XL) 25 MG 24 hr tablet   Oral   Take 0.5 tablets (12.5 mg total) by mouth daily.   30 tablet   1    BP 129/92  Pulse 102  Temp(Src) 98.7 F (37.1 C) (Oral)  Resp 14  SpO2 92% Physical Exam  Nursing note and vitals reviewed. Constitutional: She is oriented to person, place, and time. She appears well-developed and well-nourished.  HENT:  Head: Normocephalic and atraumatic.  Eyes: Conjunctivae and EOM are normal. Pupils are equal, round, and reactive to light.  Neck: Normal range of motion and phonation normal. Neck supple.  Cardiovascular: Normal rate, regular rhythm and intact distal pulses.   Pulmonary/Chest: Effort normal. She exhibits no tenderness.  Somewhat decreased air movement bilaterally without wheezes, rales, or rhonchi  Abdominal: Soft. She exhibits no distension. There is no tenderness. There is no guarding.  Musculoskeletal: Normal range of motion. She exhibits no edema and no tenderness.  Neurological: She is alert and oriented to person, place, and time. She exhibits normal muscle tone.  Skin: Skin is warm and dry.  Psychiatric: She has a normal mood and affect. Her behavior is normal. Judgment and thought content normal.    ED Course  Procedures (including critical care time)  Medications - No data to display  Patient Vitals for the past 24 hrs:  BP Temp Temp src Pulse Resp SpO2  05/30/13 2130 129/92 mmHg - - 102 14 92 %  05/30/13 2102 132/72 mmHg - - 107 16 95 %  05/30/13 2030 - - - 107 14 98 %  05/30/13 2015 156/75 mmHg - - 117 11 95 %  05/30/13 2000 138/69 mmHg - - 113 23 92 %  05/30/13 1945 120/97 mmHg - - 118 23 93 %  05/30/13 1930 136/72 mmHg - - 105 17 95 %  05/30/13 1915 141/72 mmHg - - 107 17 94 %  05/30/13 1835 156/78 mmHg 98.7 F (37.1 C) Oral 118 18 97 %     9:00 PM-Consult complete with her PCP. Service, Dr. Merrilyn Puma. Patient case explained and discussed. He agrees to admit patient for further evaluation and treatment. Call ended at 2148    Labs Review Labs Reviewed  BASIC METABOLIC PANEL - Abnormal; Notable for the following:    Potassium 3.4 (*)    Glucose, Bld 131 (*)    GFR calc non Af Amer 73 (*)    GFR calc Af Amer 85 (*)    All other components within normal limits  PRO B NATRIURETIC PEPTIDE - Abnormal; Notable for the following:    Pro B Natriuretic peptide (BNP) 350.9 (*)    All other components within normal limits  CBC  POCT I-STAT TROPONIN I   Imaging Review Dg Chest 2 View  05/30/2013   CLINICAL DATA:  Chest pain and shortness of breath  EXAM: CHEST  2 VIEW  COMPARISON:  Chest x-ray and CT PE study 04/20/2013  FINDINGS: The lungs are clear and negative for focal airspace consolidation, pulmonary edema or suspicious pulmonary nodule. Stable small  calcified granuloma in the lateral left base. No pleural effusion or pneumothorax. Cardiac and mediastinal contours are within normal limits. No acute fracture or lytic or blastic osseous lesions. The visualized upper abdominal bowel gas pattern is unremarkable. Surgical clips in right upper quadrant suggest prior cholecystectomy.  IMPRESSION: No active cardiopulmonary disease.   Electronically Signed   By: Malachy Moan M.D.   On: 05/30/2013 19:19    EKG Interpretation     Ventricular Rate:  113 PR Interval:  146 QRS Duration: 128 QT Interval:  376 QTC Calculation: 515 R Axis:   -14 Text Interpretation:  Sinus tachycardia Left bundle branch block Abnormal ECG since last tracing no significant change            MDM   1. CHF (congestive heart failure)   2. LBBB (left bundle branch block)   3. Chest pain   4. Dyspnea      Ongoing chest pain with shortness of breath and abnormal cardiac echo with left bundle branch block. Duration of abnormal cardiac physiology and left bundle branch block, is unknown. She's not had symptoms that would identify when they started. She does not appear to be in decompensated heart failure, at this time.  Nursing Notes Reviewed/ Care Coordinated, and agree without changes. Applicable Imaging Reviewed.  Interpretation of Laboratory Data incorporated into ED treatment   Plan: Admit  Flint Melter, MD 05/30/13 2151

## 2013-05-30 NOTE — Telephone Encounter (Signed)
Called patient back with results of the echo with EF of 30-35%. Will start her on metoprolol ER 12.5mg  since she has a history of asthma.  Will send referral to [facility] for rapid evaluation. (it turns out she also was found to have a left bundle branch block on EKG in September which was not followed up on).  Patient agreed with plan and expressed understanding.   Marena Chancy, PGY-3 Family Medicine Resident

## 2013-05-30 NOTE — H&P (Signed)
Family Medicine Teaching [facility] Admission History and Physical Service Pager: [phone]  Patient name: Veronica Shaw Medical record number: [id number] Date of birth: June 09, 1951 Age: 62 y.o. Gender: female  Primary Care Provider: Marena Chancy, MD Consultants: cards Code Status: DNR  Chief Complaint: chest pain, sob  Assessment and Plan: LASEAN RAHMING is a 62 y.o. female presenting with shortness of breath for the past 5 weeks . PMH is significant for CHF, DM, HTN, HLD, and asthma. And parkinson disease.   # Chest pain, SOB: CHF w/ EF 30-35% (significant change from EF 58% on myoview 1 year ago). Trop neg x2 in ED, EKG w/LBBB (seen on previous EKG, no changes today) - am ekg, troponin - cardiology consult in am. Discussed with cardiology fellow this evening who suggested cath or stress test were possible and would advise NPO past midnight.  - monitor on tele - asa (monitor for adverse reactions as she has had nosebleeds with this in the past) - atorvastatin (monitor for SE, muscle aches on simvastatin) - BNP 350 and not volume overloaded on exam despite progressive orthopnea, will hold off on diuresis pending card recs - checking lipid panel in am - repeat bmet in am - continue home ARB and diltiazem - metop started today but hasn't taken yet, will hold pending cards recs - tachycardic to 140s in ED, mostly while upset, will monitor this and likely increase dilt vs. add metop tomorrow. This has been documented in 05/27/11 by PCP and 12/01/12 by cardiology so it is not a new onset issue. Previously plan was to treat in context of managing hypertension.  -wells score of 1.5 due to tachycardia so patient is still low risk for PE  # DM2: A1C 6.8 in August - repeat A1C - hold home metformin - carb mod diet - sensitive SSI  # HTN: on valsartan and diltiazem at home - irbesartan (formulary arb) and dilt here - monitor pressures  # asthma: stable, no exacerbation, unlikely to be  the cause of her SOB as albuterol has not relieved at all per patient and no wheeze or tightness on exam - continue home flovent and albuterol prn  FEN/GI: carb mod diet with fluid restriction, saline lock IV Prophylaxis: SQ heparin  Disposition: admit to obs on telemetry pending cardiology eval tomorrow and ACS rule-out  History of Present Illness: [name] is a 62 y.o. female presenting with shortness of breath for the past five weeks after she was diagnosed with pneumonia. The SOB is worst at night. She has had a productive cough with white sputum. She endorses 5 pillow orthopnea for about the past year.  Her baseline shortness of breath has worsened since her pneumonia. It initially improved with antibiotics but then worsened about two weeks ago and she started having chest pain at that time. The pain is pressure-like, worsened by minimal exertion (such as walking to the bathroom) , and sometimes improved with rest, but also can happen at rest without an obvious trigger. Patient thought she was having a heart attack when the chest pain first came on (much more severe than her typical pain) though it is not clear why she did not seek care at that time. Since two weeks ago it has improved somewhat but is still pretty severe and above her baseline level of pain. She endorses associated stomach pain but denies sweating, nausea and radiation. She endorses loud belching recently but denies burning or bitter tasting burps. No recent travel, hemoptysis. She  has been using her rescue inhaler more lately but it has not been improving her dyspnea or chest pain.  Review Of Systems: Per HPI  Otherwise 12 point review of systems was performed and was unremarkable.  Patient Active Problem List   Diagnosis Date Noted  . Parkinson disease 05/21/2013  . Acute bronchitis 05/03/2013  . Major depression 03/31/2013  . Loss of balance 03/03/2013  . Lower extremity edema 03/03/2013  . GERD (gastroesophageal  reflux disease) 09/27/2012  . Blurry vision, bilateral 09/27/2012  . Dyspnea and respiratory abnormality 09/19/2012  . Plantar fasciitis, bilateral 06/15/2012  . Neck pain 04/12/2012  . Nonallopathic lesion 04/12/2012  . 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 Diabetic peripheral neuropathy 12/09/2011  . Hyperlipidemia 12/09/2011  . Sleep apnea 12/09/2011  . Fatigue 10/13/2011  . Chronic urticaria 05/27/2011  . Sinus tachycardia 05/27/2011  . 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 05/20/2011  . BACK PAIN WITH RADICULOPATHY 07/22/2009  . ROSACEA 05/29/2009  . ACHILLES BURSITIS OR TENDINITIS 03/27/2009  . CHEST PAIN 10/03/2007  . DYSLIPIDEMIA 05/17/2007  . DIABETES MELLITUS II, UNCOMPLICATED 09/28/2006  . HYPERTENSION, BENIGN SYSTEMIC 09/28/2006  . ASTHMA, PERSISTENT 09/28/2006   Past Medical History: Past Medical History  Diagnosis Date  . Sleep apnea     No CPAP  . Gastritis   . Plantar fasciitis   . Diabetes mellitus   . Hyperlipidemia   . Asthma   . Urticaria   . Irregular heart beat   . Parkinson disease    Past Surgical History: Past Surgical History  Procedure Laterality Date  . Tubal ligation    . Cholecystectomy    . Bunionectomy     Social History: History  Substance Use Topics  . Smoking status: Passive Smoke Exposure - Never Smoker  . Smokeless tobacco: Not on file     Comment: exposed to smoke during child hood (parents)  . Alcohol Use: No   Please also refer to relevant sections of EMR.  Family History: Family History  Problem Relation Age of Onset  . Coronary artery disease Father     Died age 67  . Coronary artery disease Mother     Died age 9   Allergies and Medications: Allergies  Allergen Reactions  . Aspirin Other (See Comments)    Causes nose bleeds  . Penicillins Shortness Of Breath and Rash    Shortness of Breath - Throat felt like it was closing.   Andres Ege [Conj Estrog-Medroxyprogest Ace] Shortness Of Breath    Throat swelling  . Ace Inhibitors  Cough  . Simvastatin Other (See Comments)    Muscle aches   No current facility-administered medications on file prior to encounter.   Current Outpatient Prescriptions on File Prior to Encounter  Medication Sig Dispense Refill  . albuterol (PROVENTIL HFA;VENTOLIN HFA) 108 (90 BASE) MCG/ACT inhaler Inhale 2 puffs into the lungs every 6 (six) hours as needed for wheezing.      . cyclobenzaprine (FLEXERIL) 5 MG tablet Take 1 tablet (5 mg total) by mouth 3 (three) times daily as needed for muscle spasms.  30 tablet  1  . diltiazem (CARDIZEM CD) 120 MG 24 hr capsule Take 120 mg by mouth daily.      . fish oil-omega-3 fatty acids 1000 MG capsule Take 1 g by mouth 2 (two) times daily.       [name] Kitchen gabapentin (NEURONTIN) 100 MG capsule Take 100-200 mg by mouth at bedtime as needed (for muscle pain).      . meloxicam (MOBIC) 7.5  MG tablet Take 7.5 mg by mouth daily as needed for pain.      . metFORMIN (GLUCOPHAGE) 500 MG tablet Take 2 tablets (1,000 mg total) by mouth 2 (two) times daily with a meal.  180 tablet  4  . nitroGLYCERIN (NITROSTAT) 0.4 MG SL tablet Place 1 tablet (0.4 mg total) under the tongue every 5 (five) minutes as needed for chest pain. Take one tablet and place under tongue if develop chest pain may repeat x2  25 tablet  2  . sucralfate (CARAFATE) 1 G tablet Take 1 g by mouth 2 (two) times daily.      . valsartan (DIOVAN) 160 MG tablet Take 1 tablet (160 mg total) by mouth daily.  30 tablet  11  . metoprolol succinate (TOPROL-XL) 25 MG 24 hr tablet Take 0.5 tablets (12.5 mg total) by mouth daily.  30 tablet  1    Objective: BP 129/92  Pulse 102  Temp(Src) 98.7 F (37.1 C) (Oral)  Resp 14  SpO2 92% Exam: General: middle aged woman, sitting up in bed, NAD HEENT: MMM, oropharynx clear, no nasal discharge, PERRL, TM w/ fluid on L Cardiovascular: regular rhythm, tachycardic to 110 (higher when upset), no murmur appreciated, no JVD Respiratory: CTAB, mildly increased WOB with subcostal  retractions and tachypnea to the low 20s Abdomen: soft, NTND Extremities: WWP, 2+ dp pulses, no LE edema Skin: no rashes Neuro: alert and oriented, no focal deficits  Labs and Imaging: CBC BMET   Recent Labs Lab 05/30/13 1840  WBC 6.1  HGB 14.6  HCT 41.6  PLT 244    Recent Labs Lab 05/30/13 1840  NA 142  K 3.4*  CL 104  CO2 26  BUN 18  CREATININE 0.83  GLUCOSE 131*  CALCIUM 10.0    Trop neg x1 ProBNP 350.9  EKG: LBBB, sinus tachycardia, no change from prior (September)  CXR: The lungs are clear and negative for focal airspace consolidation, pulmonary edema or suspicious pulmonary nodule. Stable small calcified granuloma in the lateral left base. No pleural effusion or pneumothorax. Cardiac and mediastinal contours are within normal limits. No acute fracture or lytic or blastic osseous lesions. The visualized upper abdominal bowel gas pattern is unremarkable. Surgical clips in right upper quadrant suggest prior cholecystectomy.  Echo 10/29:  - Left ventricle: The cavity size was normal. Wall thickness was normal. Systolic function was moderately to severely reduced. The estimated ejection fraction was in the range of 30% to 35%. Diffuse hypokinesis. Doppler parameters are consistent with restrictive physiology, indicative of decreased left ventricular diastolic compliance and/or increased left atrial pressure. - Ventricular septum: Septal motion showed paradox. - Atrial septum: No defect or patent foramen ovale was identified.   Beverely Low, MD 05/30/2013, 9:54 PM PGY-1, [facility] FPTS Intern pager: [phone], text pages welcome  Family Medicine Upper Level Addendum:   I have seen and examined the patient independently, discussed with Dr. Richarda Blade, fully reviewed the H+P and agree with it's contents with the additions as noted in blue text.   Additionally, I discussed this case by phone with both PCP Dr. Gwenlyn Saran and Cardiology Fellow Dr. Tresa Endo. PCP and I  were concerned about drop from lexiscan myoview EF of 58% to 30-35% in patient with multiple risk factors for ischemia especially considering recent worsening of baseline chest pain and shortness of breath. Signs and symptoms currently do not point toward CHF (other than chronic orthopnea and shortness of breath) as etiology. Dr. Tresa Endo did not believe  the 58% to 30% necessarily meant there had been an acute change given the different studies (myoview vs. Echo) but thought cardiology consult in the AM was reasonable.   Tana Conch, MD, PGY-3 05/30/2013 11:50 PM

## 2013-05-30 NOTE — Telephone Encounter (Signed)
Called patient back to check on her after reviewing her chart and seeing new bundle branch block on ekg from 04/20/13. Patient tells me that she has been having worsening dyspnea in the last week, waking her up in the middle of the night short of breath, with worsening pressure/pain in the middle of her chest with minimal exertion Given the new echo results with EF of 30-35% with diffuse hypokinesis and associated worsening symptoms with recently new left bundle branch block, I recommended that patient be evaluated in the emergency room as soon as possible. I have already started the process of getting a referral with cardiology at [facility] as soon as possible, but she may need earlier intervention.  Patient expressed understanding and agreed with plan.  Also discussed this case with Dr. Shelly Flatten on the family medicine teaching service in case she ends up being admitted.   Marena Chancy, PGY-3 Family Medicine Resident

## 2013-05-30 NOTE — Telephone Encounter (Signed)
Pt called and would like to know the results of her echo . jw

## 2013-05-30 NOTE — Telephone Encounter (Signed)
Called pt. Informed, that her doctor will read the report and we will call her back. Pt agreed. Lorenda Hatchet, Renato Battles

## 2013-05-31 ENCOUNTER — Encounter (HOSPITAL_COMMUNITY)
Admission: EM | Disposition: A | Discharge status: Home / Self Care | Payer: Self-pay | Source: Home / Self Care | Attending: Family Medicine

## 2013-05-31 ENCOUNTER — Ambulatory Visit (HOSPITAL_COMMUNITY): Admit: 2013-05-31 | Payer: Self-pay | Admitting: Cardiovascular Disease

## 2013-05-31 DIAGNOSIS — I5022 Chronic systolic (congestive) heart failure: Secondary | ICD-10-CM

## 2013-05-31 DIAGNOSIS — I200 Unstable angina: Secondary | ICD-10-CM

## 2013-05-31 DIAGNOSIS — G20 Parkinson's disease: Secondary | ICD-10-CM

## 2013-05-31 DIAGNOSIS — R079 Chest pain, unspecified: Secondary | ICD-10-CM

## 2013-05-31 DIAGNOSIS — I5030 Unspecified diastolic (congestive) heart failure: Secondary | ICD-10-CM

## 2013-05-31 DIAGNOSIS — G20A1 Parkinson's disease without dyskinesia, without mention of fluctuations: Secondary | ICD-10-CM

## 2013-05-31 HISTORY — PX: LEFT AND RIGHT HEART CATHETERIZATION WITH CORONARY ANGIOGRAM: SHX5449

## 2013-05-31 LAB — BASIC METABOLIC PANEL
BUN: 15 mg/dL (ref 6–23)
CO2: 28 mEq/L (ref 19–32)
Calcium: 9.5 mg/dL (ref 8.4–10.5)
Chloride: 105 mEq/L (ref 96–112)
Creatinine, Ser: 0.65 mg/dL (ref 0.50–1.10)
GFR calc Af Amer: >90 mL/min (ref >90)
GFR calc non Af Amer: >90 mL/min (ref >90)
Glucose, Bld: 120 mg/dL — ABNORMAL HIGH (ref 70–99)
Potassium: 3.4 mEq/L — ABNORMAL LOW (ref 3.5–5.1)
Sodium: 143 mEq/L (ref 135–145)

## 2013-05-31 LAB — LIPID PANEL
Cholesterol: 172 mg/dL (ref 0–200)
HDL: 49 mg/dL (ref >39)
LDL Cholesterol: 95 mg/dL (ref 0–99)
Total CHOL/HDL Ratio: 3.5 RATIO
Triglycerides: 142 mg/dL (ref <150)
VLDL: 28 mg/dL (ref 0–40)

## 2013-05-31 LAB — POCT I-STAT 3, ART BLOOD GAS (G3+)
Acid-Base Excess: 2 mmol/L (ref 0.0–2.0)
Bicarbonate: 27 mEq/L — ABNORMAL HIGH (ref 20.0–24.0)
O2 Saturation: 96 %
TCO2: 28 mmol/L (ref 0–100)
pCO2 arterial: 43.8 mmHg (ref 35.0–45.0)
pH, Arterial: 7.398 (ref 7.350–7.450)
pO2, Arterial: 82 mmHg (ref 80.0–100.0)

## 2013-05-31 LAB — GLUCOSE, CAPILLARY
Glucose-Capillary: 132 mg/dL — ABNORMAL HIGH (ref 70–99)
Glucose-Capillary: 117 mg/dL — ABNORMAL HIGH (ref 70–99)
Glucose-Capillary: 147 mg/dL — ABNORMAL HIGH (ref 70–99)
Glucose-Capillary: 153 mg/dL — ABNORMAL HIGH (ref 70–99)

## 2013-05-31 LAB — POCT I-STAT 3, VENOUS BLOOD GAS (G3P V)
Acid-Base Excess: 1 mmol/L (ref 0.0–2.0)
Bicarbonate: 27.5 mEq/L — ABNORMAL HIGH (ref 20.0–24.0)
O2 Saturation: 70 %
TCO2: 29 mmol/L (ref 0–100)
pCO2, Ven: 48.4 mmHg (ref 45.0–50.0)
pH, Ven: 7.362 — ABNORMAL HIGH (ref 7.250–7.300)
pO2, Ven: 39 mmHg (ref 30.0–45.0)

## 2013-05-31 LAB — HEMOGLOBIN A1C
Hgb A1c MFr Bld: 7.1 % — ABNORMAL HIGH (ref <5.7)
Mean Plasma Glucose: 157 mg/dL — ABNORMAL HIGH (ref <117)

## 2013-05-31 LAB — TROPONIN I: Troponin I: <0.3 ng/mL (ref <0.30)

## 2013-05-31 SURGERY — LEFT AND RIGHT HEART CATHETERIZATION WITH CORONARY ANGIOGRAM
Anesthesia: LOCAL

## 2013-05-31 MED ORDER — SODIUM CHLORIDE 0.9 % IV SOLN
1.0000 mL/kg/h | INTRAVENOUS | Status: AC
Start: 2013-05-31 — End: 2013-05-31
  Administered 2013-05-31: 1 mL/kg/h via INTRAVENOUS

## 2013-05-31 MED ORDER — SODIUM CHLORIDE 0.9 % IV SOLN: 250.0000 mL | INTRAVENOUS | Status: DC | PRN

## 2013-05-31 MED ORDER — SODIUM CHLORIDE 0.9 % IJ SOLN: 3.0000 mL | INTRAMUSCULAR | Status: DC | PRN

## 2013-05-31 MED ORDER — HEPARIN (PORCINE) IN NACL 2-0.9 UNIT/ML-% IJ SOLN
INTRAMUSCULAR | Status: AC
  Filled 2013-05-31: qty 1500

## 2013-05-31 MED ORDER — MIDAZOLAM HCL 2 MG/2ML IJ SOLN
INTRAMUSCULAR | Status: AC
  Filled 2013-05-31: qty 2

## 2013-05-31 MED ORDER — METOPROLOL SUCCINATE 12.5 MG HALF TABLET
12.5000 mg | ORAL_TABLET | Freq: Every day | ORAL | Status: DC
  Administered 2013-05-31: 12.5 mg via ORAL
  Filled 2013-05-31 (×3): qty 1

## 2013-05-31 MED ORDER — FENTANYL CITRATE 0.05 MG/ML IJ SOLN
INTRAMUSCULAR | Status: AC
  Filled 2013-05-31: qty 2

## 2013-05-31 MED ORDER — PNEUMOCOCCAL VAC POLYVALENT 25 MCG/0.5ML IJ INJ
0.5000 mL | INJECTION | INTRAMUSCULAR | Status: AC
  Administered 2013-06-01: 0.5 mL via INTRAMUSCULAR
  Filled 2013-05-31: qty 0.5

## 2013-05-31 MED ORDER — SODIUM CHLORIDE 0.9 % IJ SOLN
3.0000 mL | Freq: Two times a day (BID) | INTRAMUSCULAR | Status: DC
  Administered 2013-05-31: 3 mL via INTRAVENOUS

## 2013-05-31 MED ORDER — LIDOCAINE HCL (PF) 1 % IJ SOLN
INTRAMUSCULAR | Status: AC
  Filled 2013-05-31: qty 30

## 2013-05-31 MED ORDER — NITROGLYCERIN 0.2 MG/ML ON CALL CATH LAB
INTRAVENOUS | Status: AC
  Filled 2013-05-31: qty 1

## 2013-05-31 MED ORDER — SODIUM CHLORIDE 0.9 % IJ SOLN: 3.0000 mL | Freq: Two times a day (BID) | INTRAMUSCULAR | Status: DC

## 2013-05-31 MED ORDER — DIAZEPAM 5 MG PO TABS
10.0000 mg | ORAL_TABLET | ORAL | Status: AC
  Administered 2013-05-31: 10 mg via ORAL
  Filled 2013-05-31: qty 2

## 2013-05-31 MED ORDER — SODIUM CHLORIDE 0.9 % IV SOLN
1.0000 mL/kg/h | INTRAVENOUS | Status: DC
  Administered 2013-05-31: 1 mL/kg/h via INTRAVENOUS

## 2013-05-31 MED ORDER — POTASSIUM CHLORIDE CRYS ER 20 MEQ PO TBCR
20.0000 meq | EXTENDED_RELEASE_TABLET | Freq: Two times a day (BID) | ORAL | Status: AC
  Administered 2013-05-31 (×2): 20 meq via ORAL
  Filled 2013-05-31 (×2): qty 1

## 2013-05-31 NOTE — Progress Notes (Signed)
INITIAL NUTRITION ASSESSMENT  DOCUMENTATION CODES Per approved criteria  -Not Applicable   INTERVENTION: Advance diet as medically appropriate  RD to follow for nutrition care plan, add interventions accordingly  NUTRITION DIAGNOSIS: Inadequate oral intake related to inability to eat as evidenced by NPO status  Goal: Pt to meet >/= 90% of their estimated nutrition needs   Monitor:  PO diet advancement & intake, weight, labs, I/O's  Reason for Assessment: Malnutrition Screening Tool Report  62 y.o. female  Admitting Dx: Chest pain, unspecified  ASSESSMENT: Patient with PMH of CHF, DM, HTN, HLD and asthma; presented with shortness of breath for the past 5 weeks.  RD unable to interview patient at this time -- patient in CATH LAB -- per admission nutrition screen, reported recent weight loss without trying and eating poorly because of a decreased appetite -- per weight readings, patient has had a 7% weight loss since February 2014 -- not significant for time frame -- no % PO intake available at this time.  Height: Ht Readings from Last 1 Encounters:  05/30/13 5\' 2"  (1.575 m)    Weight: Wt Readings from Last 1 Encounters:  05/31/13 135 lb 3.2 oz (61.326 kg)    Ideal Body Weight: 110 lb  % Ideal Body Weight: 122%  Wt Readings from Last 10 Encounters:  05/31/13 135 lb 3.2 oz (61.326 kg)  05/31/13 135 lb 3.2 oz (61.326 kg)  05/20/13 137 lb (62.143 kg)  05/03/13 136 lb (61.689 kg)  03/27/13 139 lb (63.05 kg)  03/13/13 144 lb (65.318 kg)  03/01/13 140 lb (63.504 kg)  02/18/13 144 lb (65.318 kg)  09/27/12 144 lb (65.318 kg)  09/19/12 146 lb (66.225 kg)    Usual Body Weight: 137 lb  % Usual Body Weight: 98%  BMI:  Body mass index is 24.72 kg/(m^2).  Estimated Nutritional Needs: Kcal: 1600-1800 Protein: 80-90 gm Fluid: 1.6-1.8 L  Skin: Intact  Diet Order: NPO  EDUCATION NEEDS: -No education needs identified at this time   Intake/Output Summary (Last 24  hours) at 05/31/13 1523 Last data filed at 05/31/13 1321  Gross per 24 hour  Intake      0 ml  Output    400 ml  Net   -400 ml    Labs:   Recent Labs Lab 05/30/13 1840 05/31/13 0435  NA 142 143  K 3.4* 3.4*  CL 104 105  CO2 26 28  BUN 18 15  CREATININE 0.83 0.65  CALCIUM 10.0 9.5  GLUCOSE 131* 120*    CBG (last 3)   Recent Labs  05/30/13 2316 05/31/13 0623 05/31/13 1137  GLUCAP 139* 132* 117*    Scheduled Meds: . [facility] HOLD] atorvastatin  40 mg Oral q1800  . [facility] HOLD] carbidopa-levodopa  1 tablet Oral TID  . [[facility] HOLD] fluticasone  1 puff Inhalation BID  . [[facility] HOLD] heparin  5,000 Units Subcutaneous Q8H  . [[facility] HOLD] insulin aspart  0-5 Units Subcutaneous QHS  . [[facility] HOLD] insulin aspart  0-9 Units Subcutaneous TID WC  . [[facility] HOLD] irbesartan  150 mg Oral Daily  . [facility] HOLD] metoprolol succinate  12.5 mg Oral Daily  . [[facility] HOLD] omega-3 acid ethyl esters  1 g Oral BID  . [facility] HOLD] pneumococcal 23 valent vaccine  0.5 mL Intramuscular Tomorrow-1000  . [facility] HOLD] potassium chloride  20 mEq Oral BID  . [facility] HOLD] sertraline  100 mg Oral QHS  . [[facility] HOLD] sodium chloride  3 mL Intravenous Q12H  .  sodium chloride  3 mL Intravenous Q12H  . [facility] HOLD] sucralfate  1 g Oral BID    Continuous Infusions: . [START ON 06/01/2013] sodium chloride 1 mL/kg/hr (05/31/13 1343)  . sodium chloride 1 mL/kg/hr (05/31/13 1517)    Past Medical History  Diagnosis Date  . Sleep apnea     No CPAP  . Gastritis   . Plantar fasciitis   . Diabetes mellitus   . Hyperlipidemia   . Asthma   . Urticaria   . Irregular heart beat   . Parkinson disease     Past Surgical History  Procedure Laterality Date  . Tubal ligation    . Cholecystectomy    . Bunionectomy      Maureen Chatters, RD, LDN Pager #: [phone] After-Hours Pager #: [phone]

## 2013-05-31 NOTE — H&P (View-Only) (Signed)
CARDIOLOGY CONSULT NOTE  Patient ID: Veronica Shaw, MRN: [id number], DOB/AGE: 02/15/1951 62 y.o. Admit date: 05/30/2013 Date of Consult: 05/31/2013  Primary Physician: Marena Chancy, MD Primary Cardiologist: none Referring Physician: Dr Mauricio Po  Chief Complaint: Chest pain, shortness of breath, reduced LVEF Reason for Consultation: Chest pain.   HPI: 62 y.o. female presented to [facility] on 05/31/2013 with complaints of chest pain and shortness of breath.   This is a 62 year old woman hospitalized with chest pain and shortness of breath. She describes a long-standing history of chest pain. However, she notes recent progression of her symptoms. She has substernal chest tightness with minimal exertion. The pain is nonradiating. It has progressed over the last month. There is associated shortness of breath. She has chronic orthopnea. She denies leg swelling or PND.  Notable findings included negative troponins. However, she has new LV dysfunction with an ejection fraction of 35% by echo. Previous LVEF was normal by Myoview scan one year ago. She also has a new left bundle branch block on EKG.  Past Medical History  Diagnosis Date  . Sleep apnea     No CPAP  . Gastritis   . Plantar fasciitis   . Diabetes mellitus   . Hyperlipidemia   . Asthma   . Urticaria   . Irregular heart beat   . Parkinson disease       Surgical History:  Past Surgical History  Procedure Laterality Date  . Tubal ligation    . Cholecystectomy    . Bunionectomy       Home Meds: Prior to Admission medications   Medication Sig Start Date End Date Taking? Authorizing Provider  albuterol (PROVENTIL HFA;VENTOLIN HFA) 108 (90 BASE) MCG/ACT inhaler Inhale 2 puffs into the lungs every 6 (six) hours as needed for wheezing.   Yes Historical Provider, MD  albuterol (PROVENTIL) (2.5 MG/3ML) 0.083% nebulizer solution Take 2.5 mg by nebulization daily as needed for wheezing or shortness of breath.   Yes  Historical Provider, MD  beclomethasone (QVAR) 80 MCG/ACT inhaler Inhale 1 puff into the lungs daily.   Yes Historical Provider, MD  carbidopa-levodopa (SINEMET) 25-100 MG per tablet Take 1 tablet by mouth 3 (three) times daily.  05/01/13 05/01/14 Yes Historical Provider, MD  cyclobenzaprine (FLEXERIL) 5 MG tablet Take 1 tablet (5 mg total) by mouth 3 (three) times daily as needed for muscle spasms. 06/15/12  Yes Glori Luis, MD  diltiazem (CARDIZEM CD) 120 MG 24 hr capsule Take 120 mg by mouth daily.   Yes Historical Provider, MD  EPINEPHrine (EPIPEN) 0.3 mg/0.3 mL SOAJ injection Inject 0.3 mg into the muscle as needed (allergic reaction).   Yes Historical Provider, MD  fish oil-omega-3 fatty acids 1000 MG capsule Take 1 g by mouth 2 (two) times daily.    Yes Historical Provider, MD  gabapentin (NEURONTIN) 100 MG capsule Take 100-200 mg by mouth at bedtime as needed (for muscle pain).   Yes Historical Provider, MD  meloxicam (MOBIC) 7.5 MG tablet Take 7.5 mg by mouth daily as needed for pain.   Yes Historical Provider, MD  metFORMIN (GLUCOPHAGE) 500 MG tablet Take 2 tablets (1,000 mg total) by mouth 2 (two) times daily with a meal. 09/27/12  Yes Glori Luis, MD  nitroGLYCERIN (NITROSTAT) 0.4 MG SL tablet Place 1 tablet (0.4 mg total) under the tongue every 5 (five) minutes as needed for chest pain. Take one tablet and place under tongue if develop chest pain may repeat x2  09/18/12  Yes Rollene Rotunda, MD  OVER THE COUNTER MEDICATION Take 1 tablet by mouth daily. laxative   Yes Historical Provider, MD  PRESCRIPTION MEDICATION Take 1 application by mouth daily as needed (for rosacea). Cream from medication assistance program   Yes Historical Provider, MD  sertraline (ZOLOFT) 100 MG tablet Take 100 mg by mouth at bedtime.   Yes Historical Provider, MD  sucralfate (CARAFATE) 1 G tablet Take 1 g by mouth 2 (two) times daily.   Yes Historical Provider, MD  valsartan (DIOVAN) 160 MG tablet Take 1  tablet (160 mg total) by mouth daily. 01/25/12  Yes Reginold Agent, MD  metoprolol succinate (TOPROL-XL) 25 MG 24 hr tablet Take 0.5 tablets (12.5 mg total) by mouth daily. 05/30/13   Lonia Skinner, MD    Inpatient Medications:  . atorvastatin  40 mg Oral q1800  . carbidopa-levodopa  1 tablet Oral TID  . fluticasone  1 puff Inhalation BID  . heparin  5,000 Units Subcutaneous Q8H  . insulin aspart  0-5 Units Subcutaneous QHS  . insulin aspart  0-9 Units Subcutaneous TID WC  . irbesartan  150 mg Oral Daily  . metoprolol succinate  12.5 mg Oral Daily  . omega-3 acid ethyl esters  1 g Oral BID  . [START ON 06/01/2013] pneumococcal 23 valent vaccine  0.5 mL Intramuscular Tomorrow-1000  . potassium chloride  20 mEq Oral BID  . sertraline  100 mg Oral QHS  . sodium chloride  3 mL Intravenous Q12H  . sucralfate  1 g Oral BID      Allergies:  Allergies  Allergen Reactions  . Aspirin Other (See Comments)    Causes nose bleeds  . Penicillins Shortness Of Breath and Rash    Shortness of Breath - Throat felt like it was closing.   Andres Ege [Conj Estrog-Medroxyprogest Ace] Shortness Of Breath    Throat swelling  . Ace Inhibitors Cough  . Simvastatin Other (See Comments)    Muscle aches    History   Social History  . Marital Status: Married    Spouse Name: N/A    Number of Children: 2  . Years of Education: N/A   Occupational History  .     Social History Main Topics  . Smoking status: Passive Smoke Exposure - Never Smoker  . Smokeless tobacco: Not on file     Comment: exposed to smoke during child hood (parents)  . Alcohol Use: No  . Drug Use: No  . Sexual Activity: Not on file   Other Topics Concern  . Not on file   Social History Narrative   Lives at home with husband and the dog.     Family History  Problem Relation Age of Onset  . Coronary artery disease Father     Died age 22  . Coronary artery disease Mother     Died age 70     Review of  Systems: General: negative for chills, fever, night sweats or weight changes. Positive for generalized fatigue ENT: negative for rhinorrhea or epistaxis Cardiovascular: see HPI  Dermatological: negative for rash Respiratory: positive for cough GI: negative for nausea, vomiting, diarrhea, bright red blood per rectum, melena, or hematemesis GU: no hematuria, urgency, or frequency Neurologic: negative for visual changes, syncope, headache, or dizziness Heme: no easy bruising or bleeding Endo: negative for excessive thirst, thyroid disorder, or flushing Musculoskeletal: negative for joint pain or swelling, negative for myalgias All other systems reviewed and are otherwise negative  except as noted above.  Physical Exam: Blood pressure 125/61, pulse 97, temperature 98.3 F (36.8 C), temperature source Oral, resp. rate 20, height 5\' 2"  (1.575 m), weight 135 lb 3.2 oz (61.326 kg), SpO2 94.00%. General: Well developed, well nourished, alert and oriented, in no acute distress. HEENT: Normocephalic, atraumatic, sclera non-icteric, no xanthomas, nares are without discharge.  Neck: Supple. Carotids 2+ without bruits. JVP normal Lungs: Clear bilaterally to auscultation without wheezes, rales, or rhonchi. Breathing is unlabored. Heart: RRR with normal S1 and S2. No murmurs, rubs, or gallops appreciated. Abdomen: Soft, non-tender, non-distended with normoactive bowel sounds. No hepatomegaly. No rebound/guarding. No obvious abdominal masses. Back: No CVA tenderness Msk:  Strength and tone appear normal for age. Extremities: No clubbing, cyanosis, or edema.  Distal pedal pulses are 2+ and equal bilaterally. Neuro: CNII-XII intact, moves all extremities spontaneously. Psych:  Responds to questions appropriately with a normal affect.    Labs:  Recent Labs  05/31/13 0435  TROPONINI <0.30   Lab Results  Component Value Date   WBC 6.1 05/30/2013   HGB 14.6 05/30/2013   HCT 41.6 05/30/2013   MCV  87.0 05/30/2013   PLT 244 05/30/2013    Recent Labs Lab 05/31/13 0435  NA 143  K 3.4*  CL 105  CO2 28  BUN 15  CREATININE 0.65  CALCIUM 9.5  GLUCOSE 120*   Lab Results  Component Value Date   CHOL 172 05/31/2013   HDL 49 05/31/2013   LDLCALC 95 05/31/2013   TRIG 142 05/31/2013   Lab Results  Component Value Date   DDIMER 0.63* 04/20/2013    Radiology/Studies:  Dg Chest 2 View  05/30/2013   CLINICAL DATA:  Chest pain and shortness of breath  EXAM: CHEST  2 VIEW  COMPARISON:  Chest x-ray and CT PE study 04/20/2013  FINDINGS: The lungs are clear and negative for focal airspace consolidation, pulmonary edema or suspicious pulmonary nodule. Stable small calcified granuloma in the lateral left base. No pleural effusion or pneumothorax. Cardiac and mediastinal contours are within normal limits. No acute fracture or lytic or blastic osseous lesions. The visualized upper abdominal bowel gas pattern is unremarkable. Surgical clips in right upper quadrant suggest prior cholecystectomy.  IMPRESSION: No active cardiopulmonary disease.   Electronically Signed   By: Malachy Moan M.D.   On: 05/30/2013 19:19    EKG: 05/30/2013: Sinus tach with LBBB (new from tracing 12/02/2011  2 D Echo 05/29/2013: Study Conclusions  - Left ventricle: The cavity size was normal. Wall thickness was normal. Systolic function was moderately to severely reduced. The estimated ejection fraction was in the range of 30% to 35%. Diffuse hypokinesis. Doppler parameters are consistent with restrictive physiology, indicative of decreased left ventricular diastolic compliance and/or increased left atrial pressure. - Ventricular septum: Septal motion showed paradox. - Atrial septum: No defect or patent foramen ovale was identified.  Myoview Scan 12/06/2011: QPS  Raw Data Images: Soft tissue (breast, diaphragm, bowel acitvity) surround heart.  Stress Images: Normal homogeneous uptake in all areas of the  myocardium.  Rest Images: Normal homogeneous uptake in all areas of the myocardium.  Subtraction (SDS): No evidence of ischemia.  Transient Ischemic Dilatation (Normal <1.22): 1.12  Lung/Heart Ratio (Normal <0.45): 0.28  Quantitative Gated Spect Images  QGS EDV: 70 ml  QGS ESV: 30 ml  Impression  Exercise Capacity: Lexiscan with no exercise.  BP Response: Normal blood pressure response.  Clinical Symptoms: No chest pain.  ECG Impression: No significant ST segment  change suggestive of ischemia.  Comparison with Prior Nuclear Study: No significant change  Overall Impression: Normal stress nuclear study.  LV Ejection Fraction: 58%. LV Wall Motion: NL LV Function; NL Wall Motion   ASSESSMENT AND PLAN:  62 year-old woman with chest pain concerning for unstable angina. Symptoms are  Progressive and associated objective findings of LV dysfunction and new LBBB raise suspicion of obstructive CAD. She is at high risk considering the presence of diabetes and strong family hx of CAD. I think right and left cardiac cath with possible PCI is indicated and I have reviewed specific risks, indications, and alternatives with the patient who understands and agrees to proceed. Discussed with her daughter and husband as well. Further plans pending cath results. She is on Toprol XL and avapro which are appropriate for her cardiomyopathy. There are no signs of acute heart failure on exam. She will likely need antiplatelet Rx and with her hx of ASA allergy will consider plavix. Await cath result before starting this.   Enzo Bi  05/31/2013, 10:03 AM

## 2013-05-31 NOTE — Plan of Care (Signed)
Problem: Phase I Progression Outcomes Goal: Pain controlled with appropriate interventions Outcome: Progressing Ef 30-35%

## 2013-05-31 NOTE — H&P (Signed)
FMTS Attending Admit Note Patient seen and examined by me, I have reviewed resident admit note and agree with assessment and plan, with following additions: Briefly, 62yoF who is admitted with cc chest pressure and shortness of breath that is worsened by mild exertion; she has had similar symptoms for greater than 1 month.  Also with persistent tachycardia over many years, she describes her first hospitalization for tachycardia 18 yrs ago in [place].  She had presented for this complaint in Sept, had mildly elevated D-dimer on Sept 20, and a CTA chest on that date that was negative for pulm embolism.  She denies any history of VTE in the past. Denies fever or chills, describes mild diaphoresis at times, along with the palpitations.  Her admission ECG is unchanged from prior with LBBB.  She had ECHO on 10/29 that showed a decreased LVEF when compared with myoview study a year ago.   On exam she is pleasant, in no distress, no nasal canula oxygen at 2L/minute. Speaking fluidly.  HEENT Neck supple.  COR Regular S1S2 PULM Distant lung sounds, no rales or wheezes appreciated.  EXTS no disparate calf girth or calf/popliteal tenderness, no edema. Feet are warm.   Assess/Plan: Patient is admitted for continued chest pressure/shortness of breath.  Initial TropI is negative.  She had a recent D-dimer and subsequent CTA that was negative, I see nothing to suggest a more recent onset VTE that would necessitate revisiting this workup today.  She had a TSH that was unremarkable a little over a month ago. I agree with medication regimen for her, cardiology consult for consideration of further testing for CAD.  Paula Compton, MD

## 2013-05-31 NOTE — Progress Notes (Signed)
1540 BACK TO ROOM FROM cARDIAC CATH LAB VIA STRETCHER WITH RN AND NT

## 2013-05-31 NOTE — CV Procedure (Signed)
   Cardiac Catheterization Procedure Note  Name: Veronica Shaw MRN: [id number] DOB: Sep 01, 1950  Procedure: Right Heart Cath, Left Heart Cath, Selective Coronary Angiography, LV angiography  Indication: Chest pain, cardiomyopathy, LBBB   Procedural Details: The right groin was prepped, draped, and anesthetized with 1% lidocaine. Using the modified Seldinger technique a 5 French sheath was placed in the right femoral artery and a 6 French sheath was placed in the right femoral vein. A multipurpose catheter was used for the right heart catheterization. Standard protocol was followed for recording of right heart pressures and sampling of oxygen saturations. Fick cardiac output was calculated. Standard Judkins catheters were used for selective coronary angiography and left ventriculography. There were no immediate procedural complications. The patient was transferred to the post catheterization recovery area for further monitoring.  Procedural Findings: Hemodynamics RA mean of 2 RV 19/2 PA 19/12 with a mean of 14 PCWP mean of 5 LV 128/11 AO 131/73 with a mean of 99  Oxygen saturations: PA 70 AO 96  Cardiac Output (Fick) 4.2  Cardiac Index (Fick) 2.6   Coronary angiography: Coronary dominance: right  Left mainstem: Arises from the left cusp. Divides into the LAD and left circumflex. Widely patent.  Left anterior descending (LAD): The LAD wraps around the left ventricular apex. There is minor luminal irregularity in the proximal LAD. The diagonal branches are patent without stenosis.  Left circumflex (LCx): The left circumflex is normal in caliber. There is a single large obtuse marginal with no stenosis present.  Right coronary artery (RCA): The RCA is dominant. This is a normal caliber vessel. There is no obstructive disease throughout. The PDA is patent.  Left ventriculography: There is diffuse hypokinesis of the left ventricle. The estimated left ventricular ejection fraction is  35%. There is more marked hypokinesis of the anterolateral wall.  Final Conclusions:   1. Widely patent coronary arteries with minimal nonobstructive CAD 2. Normal intracardiac hemodynamics 3. Severe global and segmental left ventricular systolic dysfunction  Recommendations: Despite the patient's cardiomyopathy, her intracardiac hemodynamics are completely normal. In fact, her filling pressures are low. There is no indication for diuretics. Her coronaries are patent. Recommend continuation of her current medical therapy without changes.  [name] 05/31/2013, 2:41 PM

## 2013-05-31 NOTE — Progress Notes (Signed)
FMTS Attending Daily Note: Fairley Copher MD [phone] pager office [phone] I have discussed this patient with the resident and reviewed the assessment and plan as documented above. I agree wit the resident's findings and plan.  

## 2013-05-31 NOTE — Progress Notes (Signed)
Family Medicine Teaching Service Daily Progress Note Intern Pager: [phone]  Patient name: Veronica Shaw Medical record number: [id number] Date of birth: 1951-03-11 Age: 62 y.o. Gender: female  Primary Care Provider: Marena Chancy, MD Consultants: cardiology Code Status: Full  Pt Overview and Major Events to Date:  10/30 - admitted with chest pain and tachycardia  Assessment and Plan: Veronica Shaw is a 62 y.o. female presenting with shortness of breath for the past 5 weeks . PMH is significant for CHF, DM, HTN, HLD, and asthma. And parkinson disease.   # Chest pain, SOB: CHF w/ EF 30-35% (significant change from EF 58% on myoview 1 year ago). Trop neg x2 in ED, EKG w/LBBB (seen on previous EKG, no changes today)  - troponin neg x3 - am ekg pending - cardiology consult today. Discussed with cardiology fellow last night who suggested cath or stress test were possible and would advise NPO past midnight.  - monitor on tele  - no asa as patient reporting throat closing with this in the past - atorvastatin (monitor for SE, muscle aches on simvastatin)  - BNP 350 and not volume overloaded on exam despite progressive orthopnea, will hold off on diuresis pending card recs  - lipid panel (chol 172, LDL 95, HDL 49) - continue home ARB  - tachycardic to 140s in ED, mostly while upset. This has been documented in 05/27/11 by PCP and 12/01/12 by cardiology so it is not a new onset issue. -wells score of 1.5 due to tachycardia so patient is still low risk for PE and neg CTA last month with no changes since - switch dilt to metop given HF [ ]  f/u cardiology recs   # DM2: A1C 6.8 in August, CBGs 132-139  - repeat A1C  - hold home metformin  - carb mod diet  - sensitive SSI   # HTN: on valsartan and diltiazem at home  - irbesartan (formulary arb) and metop  - monitor pressures, 120s-140s/60s-80s  # asthma: stable, no exacerbation, unlikely to be the cause of her SOB as albuterol has not  relieved at all per patient and no wheeze or tightness on exam  - continue home flovent and albuterol prn   # Mild hypokalemia: 3.4 on admission + kdur -> 3.4  - ordered kdur x2 today - repeat bmet in am if still here  FEN/GI: carb mod diet with fluid restriction, saline lock IV  Prophylaxis: SQ heparin   Disposition: home pending cardiology eval and ACS rule-out  Subjective: Feeling better this morning, not in pain and breathing comfortably, reports two episodes of unprovoked chest pain and shortness of breath overnight, reports these are similar to what she has been experiencing over the past weeks  Objective: Temp:  [97.2 F (36.2 C)-98.7 F (37.1 C)] 98.3 F (36.8 C) (10/31 0541) Pulse Rate:  [97-144] 97 (10/31 0541) Resp:  [11-26] 20 (10/31 0541) BP: (114-156)/(61-123) 125/61 mmHg (10/31 0541) SpO2:  [85 %-98 %] 94 % (10/31 0541) Weight:  [135 lb 3.2 oz (61.326 kg)-135 lb 9.6 oz (61.508 kg)] 135 lb 3.2 oz (61.326 kg) (10/31 0541) Physical Exam: General: middle aged woman, sitting up in bed, NAD  HEENT: MMM, NCAT  Cardiovascular: regular rhythm, normal rate, no murmur appreciated, no JVD  Respiratory: CTAB, normal WOB  Abdomen: soft, NTND  Extremities: WWP, 2+ dp pulses, no LE edema  Skin: no rashes  Neuro: alert and oriented, no focal deficits  Laboratory:  Recent Labs Lab 05/30/13 1840  WBC 6.1  HGB 14.6  HCT 41.6  PLT 244    Recent Labs Lab 05/30/13 1840 05/31/13 0435  NA 142 143  K 3.4* 3.4*  CL 104 105  CO2 26 28  BUN 18 15  CREATININE 0.83 0.65  CALCIUM 10.0 9.5  GLUCOSE 131* 120*   Trop neg x3  ProBNP 350.9    05/31/2013 04:35  Cholesterol 172  Triglycerides 142  HDL 49  LDL (calc) 95  VLDL 28  TCHOL/HDL Ratio 3.5   Imaging/Diagnostic Tests: EKG 10/30: LBBB, sinus tachycardia, no change from prior (September)  EKG 10/31: pending  CXR: The lungs are clear and negative for focal airspace consolidation, pulmonary edema or  suspicious pulmonary nodule. Stable small calcified granuloma in the lateral left base. No pleural effusion or pneumothorax. Cardiac and mediastinal contours are within normal limits. No acute fracture or lytic or blastic osseous lesions. The visualized upper abdominal bowel gas pattern is unremarkable. Surgical clips in right upper quadrant suggest prior cholecystectomy.   Echo 10/29:  - Left ventricle: The cavity size was normal. Wall thickness was normal. Systolic function was moderately to severely reduced. The estimated ejection fraction was in the range of 30% to 35%. Diffuse hypokinesis. Doppler parameters are consistent with restrictive physiology, indicative of decreased left ventricular diastolic compliance and/or increased left atrial pressure. - Ventricular septum: Septal motion showed paradox. - Atrial septum: No defect or patent foramen ovale was identified.  Beverely Low, MD 05/31/2013, 7:01 AM PGY-1, [facility] Family Medicine FPTS Intern pager: [phone], text pages welcome

## 2013-05-31 NOTE — Interval H&P Note (Signed)
History and Physical Interval Note:  05/31/2013 2:07 PM  Veronica Shaw  has presented today for surgery, with the diagnosis of Chest pain  The various methods of treatment have been discussed with the patient and family. After consideration of risks, benefits and other options for treatment, the patient has consented to  Procedure(s): LEFT AND RIGHT HEART CATHETERIZATION WITH CORONARY ANGIOGRAM (N/A) as a surgical intervention .  The patient's history has been reviewed, patient examined, no change in status, stable for surgery.  I have reviewed the patient's chart and labs.  Questions were answered to the patient's satisfaction.    Cath Lab Visit (complete for each Cath Lab visit)  Clinical Evaluation Leading to the Procedure:   ACS: yes  Non-ACS:    Anginal Classification: CCS IV  Anti-ischemic medical therapy: Minimal Therapy (1 class of medications)  Non-Invasive Test Results: No non-invasive testing performed  Prior CABG: No previous CABG         [name]

## 2013-05-31 NOTE — Progress Notes (Signed)
Pt a/o, grandaughter at bedside, no c/o pain, pt did c/o SOB at 230 a, O2 98% on room air, nasal canula set up for pt if she feels SOB, pt resting comfortable at bedside, pt stable, will continue to monitor

## 2013-05-31 NOTE — Consult Note (Signed)
  CARDIOLOGY CONSULT NOTE  Patient ID: Veronica Shaw, MRN: [id number], DOB/AGE: 02/11/1951 62 y.o. Admit date: 05/30/2013 Date of Consult: 05/31/2013  Primary Physician: LOSQ, STEPHANIE, MD Primary Cardiologist: none Referring Physician: Dr Breen  Chief Complaint: Chest pain, shortness of breath, reduced LVEF Reason for Consultation: Chest pain.   HPI: 62 y.o. female presented to [facility] on 05/31/2013 with complaints of chest pain and shortness of breath.   This is a 62-year-old woman hospitalized with chest pain and shortness of breath. She describes a long-standing history of chest pain. However, she notes recent progression of her symptoms. She has substernal chest tightness with minimal exertion. The pain is nonradiating. It has progressed over the last month. There is associated shortness of breath. She has chronic orthopnea. She denies leg swelling or PND.  Notable findings included negative troponins. However, she has new LV dysfunction with an ejection fraction of 35% by echo. Previous LVEF was normal by Myoview scan one year ago. She also has a new left bundle branch block on EKG.  Past Medical History  Diagnosis Date  . Sleep apnea     No CPAP  . Gastritis   . Plantar fasciitis   . Diabetes mellitus   . Hyperlipidemia   . Asthma   . Urticaria   . Irregular heart beat   . Parkinson disease       Surgical History:  Past Surgical History  Procedure Laterality Date  . Tubal ligation    . Cholecystectomy    . Bunionectomy       Home Meds: Prior to Admission medications   Medication Sig Start Date End Date Taking? Authorizing Provider  albuterol (PROVENTIL HFA;VENTOLIN HFA) 108 (90 BASE) MCG/ACT inhaler Inhale 2 puffs into the lungs every 6 (six) hours as needed for wheezing.   Yes Historical Provider, MD  albuterol (PROVENTIL) (2.5 MG/3ML) 0.083% nebulizer solution Take 2.5 mg by nebulization daily as needed for wheezing or shortness of breath.   Yes  Historical Provider, MD  beclomethasone (QVAR) 80 MCG/ACT inhaler Inhale 1 puff into the lungs daily.   Yes Historical Provider, MD  carbidopa-levodopa (SINEMET) 25-100 MG per tablet Take 1 tablet by mouth 3 (three) times daily.  05/01/13 05/01/14 Yes Historical Provider, MD  cyclobenzaprine (FLEXERIL) 5 MG tablet Take 1 tablet (5 mg total) by mouth 3 (three) times daily as needed for muscle spasms. 06/15/12  Yes Eric G Sonnenberg, MD  diltiazem (CARDIZEM CD) 120 MG 24 hr capsule Take 120 mg by mouth daily.   Yes Historical Provider, MD  EPINEPHrine (EPIPEN) 0.3 mg/0.3 mL SOAJ injection Inject 0.3 mg into the muscle as needed (allergic reaction).   Yes Historical Provider, MD  fish oil-omega-3 fatty acids 1000 MG capsule Take 1 g by mouth 2 (two) times daily.    Yes Historical Provider, MD  gabapentin (NEURONTIN) 100 MG capsule Take 100-200 mg by mouth at bedtime as needed (for muscle pain).   Yes Historical Provider, MD  meloxicam (MOBIC) 7.5 MG tablet Take 7.5 mg by mouth daily as needed for pain.   Yes Historical Provider, MD  metFORMIN (GLUCOPHAGE) 500 MG tablet Take 2 tablets (1,000 mg total) by mouth 2 (two) times daily with a meal. 09/27/12  Yes Eric G Sonnenberg, MD  nitroGLYCERIN (NITROSTAT) 0.4 MG SL tablet Place 1 tablet (0.4 mg total) under the tongue every 5 (five) minutes as needed for chest pain. Take one tablet and place under tongue if develop chest pain may repeat x2   09/18/12  Yes James Hochrein, MD  OVER THE COUNTER MEDICATION Take 1 tablet by mouth daily. laxative   Yes Historical Provider, MD  PRESCRIPTION MEDICATION Take 1 application by mouth daily as needed (for rosacea). Cream from medication assistance program   Yes Historical Provider, MD  sertraline (ZOLOFT) 100 MG tablet Take 100 mg by mouth at bedtime.   Yes Historical Provider, MD  sucralfate (CARAFATE) 1 G tablet Take 1 g by mouth 2 (two) times daily.   Yes Historical Provider, MD  valsartan (DIOVAN) 160 MG tablet Take 1  tablet (160 mg total) by mouth daily. 01/25/12  Yes Rachel L Spiegel, MD  metoprolol succinate (TOPROL-XL) 25 MG 24 hr tablet Take 0.5 tablets (12.5 mg total) by mouth daily. 05/30/13   Stephanie E Losq, MD    Inpatient Medications:  . atorvastatin  40 mg Oral q1800  . carbidopa-levodopa  1 tablet Oral TID  . fluticasone  1 puff Inhalation BID  . heparin  5,000 Units Subcutaneous Q8H  . insulin aspart  0-5 Units Subcutaneous QHS  . insulin aspart  0-9 Units Subcutaneous TID WC  . irbesartan  150 mg Oral Daily  . metoprolol succinate  12.5 mg Oral Daily  . omega-3 acid ethyl esters  1 g Oral BID  . [START ON 06/01/2013] pneumococcal 23 valent vaccine  0.5 mL Intramuscular Tomorrow-1000  . potassium chloride  20 mEq Oral BID  . sertraline  100 mg Oral QHS  . sodium chloride  3 mL Intravenous Q12H  . sucralfate  1 g Oral BID      Allergies:  Allergies  Allergen Reactions  . Aspirin Other (See Comments)    Causes nose bleeds  . Penicillins Shortness Of Breath and Rash    Shortness of Breath - Throat felt like it was closing.   . Prempro [Conj Estrog-Medroxyprogest Ace] Shortness Of Breath    Throat swelling  . Ace Inhibitors Cough  . Simvastatin Other (See Comments)    Muscle aches    History   Social History  . Marital Status: Married    Spouse Name: N/A    Number of Children: 2  . Years of Education: N/A   Occupational History  .     Social History Main Topics  . Smoking status: Passive Smoke Exposure - Never Smoker  . Smokeless tobacco: Not on file     Comment: exposed to smoke during child hood (parents)  . Alcohol Use: No  . Drug Use: No  . Sexual Activity: Not on file   Other Topics Concern  . Not on file   Social History Narrative   Lives at home with husband and the dog.     Family History  Problem Relation Age of Onset  . Coronary artery disease Father     Died age 62  . Coronary artery disease Mother     Died age 65     Review of  Systems: General: negative for chills, fever, night sweats or weight changes. Positive for generalized fatigue ENT: negative for rhinorrhea or epistaxis Cardiovascular: see HPI  Dermatological: negative for rash Respiratory: positive for cough GI: negative for nausea, vomiting, diarrhea, bright red blood per rectum, melena, or hematemesis GU: no hematuria, urgency, or frequency Neurologic: negative for visual changes, syncope, headache, or dizziness Heme: no easy bruising or bleeding Endo: negative for excessive thirst, thyroid disorder, or flushing Musculoskeletal: negative for joint pain or swelling, negative for myalgias All other systems reviewed and are otherwise negative   except as noted above.  Physical Exam: Blood pressure 125/61, pulse 97, temperature 98.3 F (36.8 C), temperature source Oral, resp. rate 20, height 5' 2" (1.575 m), weight 135 lb 3.2 oz (61.326 kg), SpO2 94.00%. General: Well developed, well nourished, alert and oriented, in no acute distress. HEENT: Normocephalic, atraumatic, sclera non-icteric, no xanthomas, nares are without discharge.  Neck: Supple. Carotids 2+ without bruits. JVP normal Lungs: Clear bilaterally to auscultation without wheezes, rales, or rhonchi. Breathing is unlabored. Heart: RRR with normal S1 and S2. No murmurs, rubs, or gallops appreciated. Abdomen: Soft, non-tender, non-distended with normoactive bowel sounds. No hepatomegaly. No rebound/guarding. No obvious abdominal masses. Back: No CVA tenderness Msk:  Strength and tone appear normal for age. Extremities: No clubbing, cyanosis, or edema.  Distal pedal pulses are 2+ and equal bilaterally. Neuro: CNII-XII intact, moves all extremities spontaneously. Psych:  Responds to questions appropriately with a normal affect.    Labs:  Recent Labs  05/31/13 0435  TROPONINI <0.30   Lab Results  Component Value Date   WBC 6.1 05/30/2013   HGB 14.6 05/30/2013   HCT 41.6 05/30/2013   MCV  87.0 05/30/2013   PLT 244 05/30/2013    Recent Labs Lab 05/31/13 0435  NA 143  K 3.4*  CL 105  CO2 28  BUN 15  CREATININE 0.65  CALCIUM 9.5  GLUCOSE 120*   Lab Results  Component Value Date   CHOL 172 05/31/2013   HDL 49 05/31/2013   LDLCALC 95 05/31/2013   TRIG 142 05/31/2013   Lab Results  Component Value Date   DDIMER 0.63* 04/20/2013    Radiology/Studies:  Dg Chest 2 View  05/30/2013   CLINICAL DATA:  Chest pain and shortness of breath  EXAM: CHEST  2 VIEW  COMPARISON:  Chest x-ray and CT PE study 04/20/2013  FINDINGS: The lungs are clear and negative for focal airspace consolidation, pulmonary edema or suspicious pulmonary nodule. Stable small calcified granuloma in the lateral left base. No pleural effusion or pneumothorax. Cardiac and mediastinal contours are within normal limits. No acute fracture or lytic or blastic osseous lesions. The visualized upper abdominal bowel gas pattern is unremarkable. Surgical clips in right upper quadrant suggest prior cholecystectomy.  IMPRESSION: No active cardiopulmonary disease.   Electronically Signed   By: Heath  McCullough M.D.   On: 05/30/2013 19:19    EKG: 05/30/2013: Sinus tach with LBBB (new from tracing 12/02/2011  2 D Echo 05/29/2013: Study Conclusions  - Left ventricle: The cavity size was normal. Wall thickness was normal. Systolic function was moderately to severely reduced. The estimated ejection fraction was in the range of 30% to 35%. Diffuse hypokinesis. Doppler parameters are consistent with restrictive physiology, indicative of decreased left ventricular diastolic compliance and/or increased left atrial pressure. - Ventricular septum: Septal motion showed paradox. - Atrial septum: No defect or patent foramen ovale was identified.  Myoview Scan 12/06/2011: QPS  Raw Data Images: Soft tissue (breast, diaphragm, bowel acitvity) surround heart.  Stress Images: Normal homogeneous uptake in all areas of the  myocardium.  Rest Images: Normal homogeneous uptake in all areas of the myocardium.  Subtraction (SDS): No evidence of ischemia.  Transient Ischemic Dilatation (Normal <1.22): 1.12  Lung/Heart Ratio (Normal <0.45): 0.28  Quantitative Gated Spect Images  QGS EDV: 70 ml  QGS ESV: 30 ml  Impression  Exercise Capacity: Lexiscan with no exercise.  BP Response: Normal blood pressure response.  Clinical Symptoms: No chest pain.  ECG Impression: No significant ST segment   change suggestive of ischemia.  Comparison with Prior Nuclear Study: No significant change  Overall Impression: Normal stress nuclear study.  LV Ejection Fraction: 58%. LV Wall Motion: NL LV Function; NL Wall Motion   ASSESSMENT AND PLAN:  62 year-old woman with chest pain concerning for unstable angina. Symptoms are  Progressive and associated objective findings of LV dysfunction and new LBBB raise suspicion of obstructive CAD. She is at high risk considering the presence of diabetes and strong family hx of CAD. I think right and left cardiac cath with possible PCI is indicated and I have reviewed specific risks, indications, and alternatives with the patient who understands and agrees to proceed. Discussed with her daughter and husband as well. Further plans pending cath results. She is on Toprol XL and avapro which are appropriate for her cardiomyopathy. There are no signs of acute heart failure on exam. She will likely need antiplatelet Rx and with her hx of ASA allergy will consider plavix. Await cath result before starting this.   Signed, Codie Krogh  05/31/2013, 10:03 AM 

## 2013-05-31 NOTE — Progress Notes (Signed)
1445 Received telephone report from [facility] <Cardiac cath RN

## 2013-06-01 ENCOUNTER — Encounter: Payer: Self-pay | Admitting: Physician Assistant

## 2013-06-01 DIAGNOSIS — K219 Gastro-esophageal reflux disease without esophagitis: Secondary | ICD-10-CM

## 2013-06-01 DIAGNOSIS — I5022 Chronic systolic (congestive) heart failure: Secondary | ICD-10-CM

## 2013-06-01 LAB — GLUCOSE, CAPILLARY
Glucose-Capillary: 150 mg/dL — ABNORMAL HIGH (ref 70–99)
Glucose-Capillary: 125 mg/dL — ABNORMAL HIGH (ref 70–99)

## 2013-06-01 LAB — BASIC METABOLIC PANEL
BUN: 17 mg/dL (ref 6–23)
CO2: 26 mEq/L (ref 19–32)
Calcium: 9.1 mg/dL (ref 8.4–10.5)
Chloride: 107 mEq/L (ref 96–112)
Creatinine, Ser: 0.69 mg/dL (ref 0.50–1.10)
GFR calc Af Amer: >90 mL/min (ref >90)
GFR calc non Af Amer: >90 mL/min (ref >90)
Glucose, Bld: 122 mg/dL — ABNORMAL HIGH (ref 70–99)
Potassium: 3.8 mEq/L (ref 3.5–5.1)
Sodium: 142 mEq/L (ref 135–145)

## 2013-06-01 MED ORDER — PANTOPRAZOLE SODIUM 40 MG PO TBEC
40.0000 mg | DELAYED_RELEASE_TABLET | Freq: Every day | ORAL | Status: DC
  Administered 2013-06-01: 40 mg via ORAL

## 2013-06-01 MED ORDER — METOPROLOL SUCCINATE ER 25 MG PO TB24: 25.0000 mg | ORAL_TABLET | Freq: Every day | ORAL | Status: DC

## 2013-06-01 MED ORDER — METOPROLOL SUCCINATE ER 25 MG PO TB24
25.0000 mg | ORAL_TABLET | Freq: Every day | ORAL | Status: DC
  Administered 2013-06-01: 25 mg via ORAL
  Filled 2013-06-01: qty 1

## 2013-06-01 MED ORDER — RANITIDINE HCL 150 MG PO TABS: 150.0000 mg | ORAL_TABLET | Freq: Two times a day (BID) | ORAL | Status: DC

## 2013-06-01 MED ORDER — ATORVASTATIN CALCIUM 40 MG PO TABS: 40.0000 mg | ORAL_TABLET | Freq: Every day | ORAL | Status: DC

## 2013-06-01 NOTE — Discharge Summary (Signed)
Family Medicine Teaching [facility] Discharge Summary  Patient name: Veronica Shaw Medical record number: [id number] Date of birth: 1950-10-14 Age: 62 y.o. Gender: female Date of Admission: 05/30/2013  Date of Discharge: 06/01/2013 Admitting Physician: Barbaraann Barthel, MD  Primary Care Provider: Marena Chancy, MD Consultants: cardiology [facility])  Indication for Hospitalization: chest pain and shortness of breath  Discharge Diagnoses/Problem List:  Chest pain Hyperlipidemia Heart failure with reduced ejection fraction Hypertension Asthma Intermediate coronary syndrome Diabetes mellitus  Disposition: to home  Discharge Condition: stable  Discharge Exam:  General: middle aged woman, sitting up in bed, NAD  HEENT: NCAT  Cardiovascular: regular rhythm, normal rate, no murmur appreciated  Respiratory: CTAB, normal WOB  Abdomen: soft, NTND  Extremities: WWP, no LE edema  Skin: no rashes  Neuro: alert and oriented, no focal deficits  Brief Hospital Course:   # Chest pain, SOB: Patient was admitted to the hospital for cardiology evaluation. Her troponins were negative x2 in the ER. An echo showed an EF of 30-35%, which was a significant change from EF of 58% on myoview one year ago. Cardiology was consulted and performed a coronary catheterization, which showed patent coronary arteries and normal intracardiac hemodynamics excepting for EF of 30-35%. Patient was not felt to be fluid overloaded and thus was not diuresed. We believed some of component of her pain was secondary to GERD, so she was started on an H2 blocker prior to discharge. We continued her ARB, beta blocker, and statin.  # DM2: A1C 7.1, held home metformin and instructed patient to resume 2 days after discharge due to contrast given during catheterization.   # HTN: diltiazem was stopped due to her heart failure, but ARB and beta blocker were continued.   # Asthma: stable, no exacerbation, continued home flovent  and prn albuterol   Issues for Follow Up:  1. Follow up chest pain, consider adding PPI to regimen if feel GERD more likely 2. Ensure patient is adherent to medications and has been able to obtain them 3. Cardiology followup should happen in 1 month, patient is to receive call to schedule this appointment.  Significant Procedures: heart catheterization  Significant Labs and Imaging:   CBC:  Lab  05/30/13 1840   WBC  6.1   HGB  14.6   HCT  41.6   PLT  244    BMET:  Lab  05/30/13 1840  05/31/13 0435  06/01/13 0500   NA  142  143  142   K  3.4*  3.4*  3.8   CL  104  105  107   CO2  26  28  26    BUN  18  15  17    CREATININE  0.83  0.65  0.69   CALCIUM  10.0  9.5  9.1   GLUCOSE  131*  120*  122*    Trop neg x3  ProBNP 350.9   05/31/2013 04:35   Cholesterol  172   Triglycerides  142   HDL  49   LDL (calc)  95   VLDL  28   TCHOL/HDL Ratio  3.5    EKG 10/30: LBBB, sinus tachycardia, no change from prior (September)  EKG 10/31: pending  CXR: The lungs are clear and negative for focal airspace consolidation, pulmonary edema or suspicious pulmonary nodule. Stable small calcified granuloma in the lateral left base. No pleural effusion or pneumothorax. Cardiac and mediastinal contours are within normal limits. No acute fracture or lytic or blastic osseous  lesions. The visualized upper abdominal bowel gas pattern is unremarkable. Surgical clips in right upper quadrant suggest prior cholecystectomy.   Echo 10/29:  - Left ventricle: The cavity size was normal. Wall thickness was normal. Systolic function was moderately to severely reduced. The estimated ejection fraction was in the range of 30% to 35%. Diffuse hypokinesis. Doppler parameters are consistent with restrictive physiology, indicative of decreased left ventricular diastolic compliance and/or increased left atrial pressure. - Ventricular septum: Septal motion showed paradox. - Atrial septum: No defect or patent foramen  ovale was identified.  Results/Tests Pending at Time of Discharge: none  Discharge Medications:    Medication List    STOP taking these medications       CARDIZEM CD 120 MG 24 hr capsule  Generic drug:  diltiazem      TAKE these medications       albuterol 108 (90 BASE) MCG/ACT inhaler  Commonly known as:  PROVENTIL HFA;VENTOLIN HFA  Inhale 2 puffs into the lungs every 6 (six) hours as needed for wheezing.     albuterol (2.5 MG/3ML) 0.083% nebulizer solution  Commonly known as:  PROVENTIL  Take 2.5 mg by nebulization daily as needed for wheezing or shortness of breath.     beclomethasone 80 MCG/ACT inhaler  Commonly known as:  QVAR  Inhale 1 puff into the lungs daily.     cyclobenzaprine 5 MG tablet  Commonly known as:  FLEXERIL  Take 1 tablet (5 mg total) by mouth 3 (three) times daily as needed for muscle spasms.     EPIPEN 0.3 mg/0.3 mL Soaj injection  Generic drug:  EPINEPHrine  Inject 0.3 mg into the muscle as needed (allergic reaction).     fish oil-omega-3 fatty acids 1000 MG capsule  Take 1 g by mouth 2 (two) times daily.     gabapentin 100 MG capsule  Commonly known as:  NEURONTIN  Take 100-200 mg by mouth at bedtime as needed (for muscle pain).     meloxicam 7.5 MG tablet  Commonly known as:  MOBIC  Take 7.5 mg by mouth daily as needed for pain.     metFORMIN 500 MG tablet  Commonly known as:  GLUCOPHAGE  Take 2 tablets (1,000 mg total) by mouth 2 (two) times daily with a meal.     metoprolol succinate 25 MG 24 hr tablet  Commonly known as:  TOPROL-XL  Take 1 tablet (25 mg total) by mouth daily.     nitroGLYCERIN 0.4 MG SL tablet  Commonly known as:  NITROSTAT  Place 1 tablet (0.4 mg total) under the tongue every 5 (five) minutes as needed for chest pain. Take one tablet and place under tongue if develop chest pain may repeat x2     OVER THE COUNTER MEDICATION  Take 1 tablet by mouth daily. laxative     PRESCRIPTION MEDICATION  Take 1  application by mouth daily as needed (for rosacea). Cream from medication assistance program     ranitidine 150 MG tablet  Commonly known as:  ZANTAC  Take 1 tablet (150 mg total) by mouth 2 (two) times daily.     sertraline 100 MG tablet  Commonly known as:  ZOLOFT  Take 100 mg by mouth at bedtime.     SINEMET 25-100 MG per tablet  Generic drug:  carbidopa-levodopa  Take 1 tablet by mouth 3 (three) times daily.     sucralfate 1 G tablet  Commonly known as:  CARAFATE  Take 1 g by mouth  2 (two) times daily.     valsartan 160 MG tablet  Commonly known as:  DIOVAN  Take 1 tablet (160 mg total) by mouth daily.       Discharge Instructions: Please refer to Patient Instructions section of EMR for full details.  Patient was counseled important signs and symptoms that should prompt return to medical care, changes in medications, dietary instructions, activity restrictions, and follow up appointments.   Follow-Up Appointments: Follow-up Information   Follow up with Marena Chancy, MD. Schedule an appointment as soon as possible for a visit in 1 week.   Specialty:  Family Medicine   Contact information:   [address] [place] [address] [phone]       Follow up with [name], MD. (they will call you for an appt in 1 month)    Specialty:  Cardiology   Contact information:   [address] [place] [address] [phone]       Latrelle Dodrill, MD 06/12/2013, 9:47 AM PGY-2, [facility]

## 2013-06-01 NOTE — Progress Notes (Signed)
Subjective: No CP  NO SOB Objective: Filed Vitals:   05/31/13 2102 06/01/13 0208 06/01/13 0517 06/01/13 0900  BP: 110/46 118/55 116/53 102/46  Pulse: 103 92 93 96  Temp: 97.8 F (36.6 C) 97.9 F (36.6 C) 97.9 F (36.6 C) 98.2 F (36.8 C)  TempSrc: Oral Oral Oral Oral  Resp: 17 17 16 20   Height:      Weight:   135 lb 2.3 oz (61.3 kg)   SpO2: 93% 98% 98% 95%   Weight change: -7.3 oz (-0.208 kg)  Intake/Output Summary (Last 24 hours) at 06/01/13 0925 Last data filed at 06/01/13 0900  Gross per 24 hour  Intake    360 ml  Output    400 ml  Net    -40 ml    General: Alert, awake, oriented x3, in no acute distress Neck:  JVP is normal Heart: Regular rate and rhythm, without murmurs, rubs, gallops.  Lungs: Clear to auscultation.  No rales or wheezes. Exemities:  No edema.  R groin without hematoma or bruit Neuro: Grossly intact, nonfocal.  Tele:  SR Lab Results: Results for orders placed during the hospital encounter of 05/30/13 (from the past 24 hour(s))  GLUCOSE, CAPILLARY     Status: Abnormal   Collection Time    05/31/13 11:37 AM      Result Value Range   Glucose-Capillary 117 (*) 70 - 99 mg/dL   Comment 1 Notify RN    POCT I-STAT 3, BLOOD GAS (G3+)     Status: Abnormal   Collection Time    05/31/13  2:24 PM      Result Value Range   pH, Arterial 7.398  7.350 - 7.450   pCO2 arterial 43.8  35.0 - 45.0 mmHg   pO2, Arterial 82.0  80.0 - 100.0 mmHg   Bicarbonate 27.0 (*) 20.0 - 24.0 mEq/L   TCO2 28  0 - 100 mmol/L   O2 Saturation 96.0     Acid-Base Excess 2.0  0.0 - 2.0 mmol/L   Sample type ARTERIAL    POCT I-STAT 3, BLOOD GAS (G3P V)     Status: Abnormal   Collection Time    05/31/13  2:29 PM      Result Value Range   pH, Ven 7.362 (*) 7.250 - 7.300   pCO2, Ven 48.4  45.0 - 50.0 mmHg   pO2, Ven 39.0  30.0 - 45.0 mmHg   Bicarbonate 27.5 (*) 20.0 - 24.0 mEq/L   TCO2 29  0 - 100 mmol/L   O2 Saturation 70.0     Acid-Base Excess 1.0  0.0 - 2.0 mmol/L   Sample  type VENOUS    GLUCOSE, CAPILLARY     Status: Abnormal   Collection Time    05/31/13  4:36 PM      Result Value Range   Glucose-Capillary 147 (*) 70 - 99 mg/dL   Comment 1 Notify RN    GLUCOSE, CAPILLARY     Status: Abnormal   Collection Time    05/31/13  9:50 PM      Result Value Range   Glucose-Capillary 153 (*) 70 - 99 mg/dL   Comment 1 Notify RN    BASIC METABOLIC PANEL     Status: Abnormal   Collection Time    06/01/13  5:00 AM      Result Value Range   Sodium 142  135 - 145 mEq/L   Potassium 3.8  3.5 - 5.1 mEq/L   Chloride 107  96 -  112 mEq/L   CO2 26  19 - 32 mEq/L   Glucose, Bld 122 (*) 70 - 99 mg/dL   BUN 17  6 - 23 mg/dL   Creatinine, Ser 4.54  0.50 - 1.10 mg/dL   Calcium 9.1  8.4 - 09.8 mg/dL   GFR calc non Af Amer >90  >90 mL/min   GFR calc Af Amer >90  >90 mL/min  GLUCOSE, CAPILLARY     Status: Abnormal   Collection Time    06/01/13  6:19 AM      Result Value Range   Glucose-Capillary 150 (*) 70 - 99 mg/dL    Studies/Results: @RISRSLT24 @  Medications: Reviewed   @PROBHOSP @  1.  Chronic systolic CHF  Nonischemic by cath  Voluem status is good  I would keep on same regimen Increase toprol to 25 mg XL   She will f/u with Dr Antoine Poche as outpatient  LOS: 2 days   Dietrich Pates 06/01/2013, 9:25 AM

## 2013-06-01 NOTE — Progress Notes (Signed)
Patient ID: Veronica Shaw, female   DOB: 1951/05/27, 62 y.o.   MRN: [id number] Asked by Dr. Tenny Craw to schedule f/u appt Dr. Antoine Poche. I have left a message on our office's scheduling voicemail requesting a follow-up appointment, and our office will call the patient with this appointment. Marly Schuld PA-C

## 2013-06-01 NOTE — Progress Notes (Signed)
FMTS Attending Daily Note: Cheney Gosch MD [phone] pager office [phone] I  have seen and examined this patient, reviewed their chart. I have discussed this patient with the resident. I agree with the resident's findings, assessment and care plan. 

## 2013-06-01 NOTE — Progress Notes (Signed)
Family Medicine Teaching Service Daily Progress Note Intern Pager: [phone]  Patient name: Veronica Shaw Medical record number: [id number] Date of birth: 01/08/1951 Age: 62 y.o. Gender: female  Primary Care Provider: Marena Chancy, MD Consultants: cardiology Code Status: Full  Pt Overview and Major Events to Date:  10/30 - admitted with chest pain and tachycardia 10/31 - cath done, patent coronary arteries  Assessment and Plan: Veronica Shaw is a 62 y.o. female presenting with shortness of breath for the past 5 weeks . PMH is significant for CHF, DM, HTN, HLD, and asthma. And parkinson disease.   # Chest pain, SOB: CHF w/ EF 30-35% (significant change from EF 58% on myoview 1 year ago). Trop neg x2 in ED, EKG w/LBBB (seen on previous EKG, no changes)  - cathed yesterday, patent coronary arteries, EF 30-35%, normal intracardiac hemodynamics - per cards, no indication for diuresis - monitor on tele  - no asa as patient reporting throat closing with this in the past - continue ARB, metoprolol, atorvastatin - likely some component of GERD, have started PPI - per cardiology, ready for d/c today with outpatient follow up  # DM2: A1C 7.1, CBGs acceptable - hold home metformin, will resume in 2 days after d/c - carb mod diet  - sensitive SSI   # HTN: on valsartan and diltiazem at home  - irbesartan (formulary arb) and metop (changed from dilt due to HF)  - monitor pressures, currently acceptable  # asthma: stable, no exacerbation, unlikely to be the cause of her SOB as albuterol has not relieved at all per patient and no wheeze on exam  - continue home flovent and albuterol prn   # Mild hypokalemia: resolved  FEN/GI: carb mod diet with fluid restriction, saline lock IV  Prophylaxis: SQ heparin   Disposition: ready for d/c home today  Subjective: Patient reports she feels ready for discharge today. She did have an episode or 2 of shortness of breath last night, which improved  after using her inhaler.  Objective: Temp:  [97.8 F (36.6 C)-97.9 F (36.6 C)] 97.9 F (36.6 C) (11/01 0517) Pulse Rate:  [92-116] 93 (11/01 0517) Resp:  [16-18] 16 (11/01 0517) BP: (110-138)/(46-75) 116/53 mmHg (11/01 0517) SpO2:  [93 %-98 %] 98 % (11/01 0517) Weight:  [135 lb 2.3 oz (61.3 kg)] 135 lb 2.3 oz (61.3 kg) (11/01 0517) Physical Exam: General: middle aged woman, sitting up in bed, NAD  HEENT: NCAT  Cardiovascular: regular rhythm, normal rate, no murmur appreciated Respiratory: CTAB, normal WOB  Abdomen: soft, NTND  Extremities: WWP, no LE edema  Skin: no rashes  Neuro: alert and oriented, no focal deficits  Laboratory:  Recent Labs Lab 05/30/13 1840  WBC 6.1  HGB 14.6  HCT 41.6  PLT 244    Recent Labs Lab 05/30/13 1840 05/31/13 0435 06/01/13 0500  NA 142 143 142  K 3.4* 3.4* 3.8  CL 104 105 107  CO2 26 28 26   BUN 18 15 17   CREATININE 0.83 0.65 0.69  CALCIUM 10.0 9.5 9.1  GLUCOSE 131* 120* 122*   Trop neg x3  ProBNP 350.9    05/31/2013 04:35  Cholesterol 172  Triglycerides 142  HDL 49  LDL (calc) 95  VLDL 28  TCHOL/HDL Ratio 3.5   Imaging/Diagnostic Tests: EKG 10/30: LBBB, sinus tachycardia, no change from prior (September)  EKG 10/31: pending  CXR: The lungs are clear and negative for focal airspace consolidation, pulmonary edema or suspicious pulmonary nodule. Stable small calcified  granuloma in the lateral left base. No pleural effusion or pneumothorax. Cardiac and mediastinal contours are within normal limits. No acute fracture or lytic or blastic osseous lesions. The visualized upper abdominal bowel gas pattern is unremarkable. Surgical clips in right upper quadrant suggest prior cholecystectomy.   Echo 10/29:  - Left ventricle: The cavity size was normal. Wall thickness was normal. Systolic function was moderately to severely reduced. The estimated ejection fraction was in the range of 30% to 35%. Diffuse hypokinesis. Doppler  parameters are consistent with restrictive physiology, indicative of decreased left ventricular diastolic compliance and/or increased left atrial pressure. - Ventricular septum: Septal motion showed paradox. - Atrial septum: No defect or patent foramen ovale was identified.  Medications: Scheduled Meds: . atorvastatin  40 mg Oral q1800  . carbidopa-levodopa  1 tablet Oral TID  . fluticasone  1 puff Inhalation BID  . heparin  5,000 Units Subcutaneous Q8H  . insulin aspart  0-5 Units Subcutaneous QHS  . insulin aspart  0-9 Units Subcutaneous TID WC  . irbesartan  150 mg Oral Daily  . metoprolol succinate  12.5 mg Oral Daily  . omega-3 acid ethyl esters  1 g Oral BID  . pantoprazole  40 mg Oral Daily  . pneumococcal 23 valent vaccine  0.5 mL Intramuscular Tomorrow-1000  . sertraline  100 mg Oral QHS  . sodium chloride  3 mL Intravenous Q12H  . sodium chloride  3 mL Intravenous Q12H  . sucralfate  1 g Oral BID   Continuous Infusions:  PRN Meds:.sodium chloride, sodium chloride, acetaminophen, albuterol, cyclobenzaprine, gabapentin, meloxicam, nitroGLYCERIN, ondansetron (ZOFRAN) IV, sodium chloride, sodium chloride   Latrelle Dodrill, MD 06/01/2013, 8:44 AM PGY-2, [facility] FPTS Intern pager: [phone], text pages welcome

## 2013-06-03 LAB — GLUCOSE, CAPILLARY: Glucose-Capillary: 91 mg/dL (ref 70–99)

## 2013-06-05 ENCOUNTER — Telehealth: Payer: Self-pay | Admitting: Family Medicine

## 2013-06-05 MED ORDER — ROSUVASTATIN CALCIUM 20 MG PO TABS: 20.0000 mg | ORAL_TABLET | Freq: Every day | ORAL | Status: DC

## 2013-06-05 NOTE — Telephone Encounter (Signed)
Received fax from [facility] pharmacy that lipitor is no longer available for free from manufacturer. Pharmacy requests rx for crestor. Will prescribe and fax new rx to [facility].  Latrelle Dodrill, MD

## 2013-06-07 ENCOUNTER — Telehealth: Payer: Self-pay | Admitting: Family Medicine

## 2013-06-07 NOTE — Telephone Encounter (Signed)
Will fwd. To Dr.Losq for review. Last metoprolol xl 25mg  daily per Dr.McIntyre. Lorenda Hatchet, Renato Battles

## 2013-06-07 NOTE — Telephone Encounter (Signed)
Pt called because there is some confusion on her medication. Metoprolol the instructions say 1 pill a day and Dr Gwenlyn Saran told her to take 1/2 pill a day. She just wants to know which one to do. JW

## 2013-06-10 NOTE — Telephone Encounter (Signed)
Called patient and let her know that she needs to take toprol xl 25mg . She also tells me that she feels dizzy and has some pain in her ear and was told in the hospital that she had fluid in her ear. I told her that if it continues to get worst, she should be evaluated at the clinic. She stated that the copay is very expensive and that she thinks she will wait. I re-iterated that if it gets worst, that I would recommend that she come sooner than her follow up appt with me on the 21st.  Veronica Shaw, PGY-3 Family Medicine Resident

## 2013-06-13 ENCOUNTER — Ambulatory Visit: Payer: No Typology Code available for payment source | Admitting: Family Medicine

## 2013-06-14 ENCOUNTER — Ambulatory Visit: Payer: No Typology Code available for payment source | Admitting: Family Medicine

## 2013-06-17 ENCOUNTER — Emergency Department (INDEPENDENT_AMBULATORY_CARE_PROVIDER_SITE_OTHER): Payer: No Typology Code available for payment source

## 2013-06-17 ENCOUNTER — Encounter (HOSPITAL_COMMUNITY): Payer: Self-pay | Admitting: Emergency Medicine

## 2013-06-17 ENCOUNTER — Emergency Department (HOSPITAL_COMMUNITY)
Admission: EM | Admit: 2013-06-17 | Discharge: 2013-06-17 | Disposition: A | Discharge status: Home / Self Care | Payer: No Typology Code available for payment source | Source: Home / Self Care | Attending: Family Medicine | Admitting: Family Medicine

## 2013-06-17 DIAGNOSIS — J40 Bronchitis, not specified as acute or chronic: Secondary | ICD-10-CM

## 2013-06-17 DIAGNOSIS — H7390 Unspecified disorder of tympanic membrane, unspecified ear: Secondary | ICD-10-CM

## 2013-06-17 DIAGNOSIS — H6591 Unspecified nonsuppurative otitis media, right ear: Secondary | ICD-10-CM

## 2013-06-17 LAB — POCT RAPID STREP A: Streptococcus, Group A Screen (Direct): NEGATIVE

## 2013-06-17 MED ORDER — IPRATROPIUM BROMIDE 0.02 % IN SOLN
RESPIRATORY_TRACT | Status: AC
  Filled 2013-06-17: qty 2.5

## 2013-06-17 MED ORDER — PREDNISONE 50 MG PO TABS: 50.0000 mg | ORAL_TABLET | Freq: Every day | ORAL | Status: DC

## 2013-06-17 MED ORDER — HYDROCODONE-ACETAMINOPHEN 5-325 MG PO TABS: 0.5000 | ORAL_TABLET | Freq: Every evening | ORAL | Status: DC | PRN

## 2013-06-17 MED ORDER — IPRATROPIUM BROMIDE 0.02 % IN SOLN
0.5000 mg | Freq: Once | RESPIRATORY_TRACT | Status: AC
  Administered 2013-06-17: 0.5 mg via RESPIRATORY_TRACT

## 2013-06-17 MED ORDER — ALBUTEROL SULFATE (5 MG/ML) 0.5% IN NEBU
INHALATION_SOLUTION | RESPIRATORY_TRACT | Status: AC
  Filled 2013-06-17: qty 1

## 2013-06-17 MED ORDER — ALBUTEROL SULFATE HFA 108 (90 BASE) MCG/ACT IN AERS: 2.0000 | INHALATION_SPRAY | Freq: Four times a day (QID) | RESPIRATORY_TRACT | Status: DC | PRN

## 2013-06-17 MED ORDER — ALBUTEROL SULFATE (5 MG/ML) 0.5% IN NEBU
5.0000 mg | INHALATION_SOLUTION | Freq: Once | RESPIRATORY_TRACT | Status: AC
  Administered 2013-06-17: 5 mg via RESPIRATORY_TRACT

## 2013-06-17 MED ORDER — ANTIPYRINE-BENZOCAINE 5.4-1.4 % OT SOLN: 3.0000 [drp] | OTIC | Status: DC | PRN

## 2013-06-17 MED ORDER — ALBUTEROL SULFATE HFA 108 (90 BASE) MCG/ACT IN AERS
INHALATION_SPRAY | RESPIRATORY_TRACT | Status: AC
  Filled 2013-06-17: qty 6.7

## 2013-06-17 NOTE — ED Notes (Signed)
C/o right ear pain for a month now States she was in the ER when was told she had fluid in her left ear but states no care was given States she has a cough and sore throat for a week now. She has not ever seen an ENT specialist

## 2013-06-17 NOTE — ED Provider Notes (Signed)
Veronica Shaw is a 62 y.o. female who presents to Urgent Care today for one week of sore throat, hoarse voice, and productive cough. She denies any significant chest pains or shortness of breath. She notes her symptoms worsened yesterday. She's tried several over-the-counter medications have not been effective. Of note patient was recently discharged from the hospital about one month ago for heart failure with reduced ejection fraction. She denies any lower extremity swelling. She feels well otherwise.   Past Medical History  Diagnosis Date  . Sleep apnea     No CPAP  . Gastritis   . Plantar fasciitis   . Diabetes mellitus   . Hyperlipidemia   . Asthma   . Urticaria   . Irregular heart beat   . Parkinson disease    History  Substance Use Topics  . Smoking status: Passive Smoke Exposure - Never Smoker  . Smokeless tobacco: Not on file     Comment: exposed to smoke during child hood (parents)  . Alcohol Use: No   ROS as above Medications reviewed. Current Facility-Administered Medications  Medication Dose Route Frequency Provider Last Rate Last Dose  . albuterol (PROVENTIL HFA;VENTOLIN HFA) 108 (90 BASE) MCG/ACT inhaler 2 puff  2 puff Inhalation Q6H PRN Rodolph Bong, MD      . antipyrine-benzocaine Lyla Son) otic solution 3-4 drop  3-4 drop Right Ear Q2H PRN Rodolph Bong, MD       Current Outpatient Prescriptions  Medication Sig Dispense Refill  . albuterol (PROVENTIL HFA;VENTOLIN HFA) 108 (90 BASE) MCG/ACT inhaler Inhale 2 puffs into the lungs every 6 (six) hours as needed for wheezing.      [name] Kitchen albuterol (PROVENTIL) (2.5 MG/3ML) 0.083% nebulizer solution Take 2.5 mg by nebulization daily as needed for wheezing or shortness of breath.      . beclomethasone (QVAR) 80 MCG/ACT inhaler Inhale 1 puff into the lungs daily.      . carbidopa-levodopa (SINEMET) 25-100 MG per tablet Take 1 tablet by mouth 3 (three) times daily.       . cyclobenzaprine (FLEXERIL) 5 MG tablet Take 1 tablet (5  mg total) by mouth 3 (three) times daily as needed for muscle spasms.  30 tablet  1  . EPINEPHrine (EPIPEN) 0.3 mg/0.3 mL SOAJ injection Inject 0.3 mg into the muscle as needed (allergic reaction).      . fish oil-omega-3 fatty acids 1000 MG capsule Take 1 g by mouth 2 (two) times daily.       [name] Kitchen gabapentin (NEURONTIN) 100 MG capsule Take 100-200 mg by mouth at bedtime as needed (for muscle pain).      [name] Kitchen HYDROcodone-acetaminophen (NORCO/VICODIN) 5-325 MG per tablet Take 0.5 tablets by mouth at bedtime as needed (cough).  6 tablet  0  . meloxicam (MOBIC) 7.5 MG tablet Take 7.5 mg by mouth daily as needed for pain.      . metFORMIN (GLUCOPHAGE) 500 MG tablet Take 2 tablets (1,000 mg total) by mouth 2 (two) times daily with a meal.  180 tablet  4  . metoprolol succinate (TOPROL-XL) 25 MG 24 hr tablet Take 1 tablet (25 mg total) by mouth daily.      . nitroGLYCERIN (NITROSTAT) 0.4 MG SL tablet Place 1 tablet (0.4 mg total) under the tongue every 5 (five) minutes as needed for chest pain. Take one tablet and place under tongue if develop chest pain may repeat x2  25 tablet  2  . OVER THE COUNTER MEDICATION Take 1 tablet by  mouth daily. laxative      . predniSONE (DELTASONE) 50 MG tablet Take 1 tablet (50 mg total) by mouth daily.  5 tablet  0  . PRESCRIPTION MEDICATION Take 1 application by mouth daily as needed (for rosacea). Cream from medication assistance program      . ranitidine (ZANTAC) 150 MG tablet Take 1 tablet (150 mg total) by mouth 2 (two) times daily.  60 tablet  1  . rosuvastatin (CRESTOR) 20 MG tablet Take 1 tablet (20 mg total) by mouth daily.  30 tablet  1  . sertraline (ZOLOFT) 100 MG tablet Take 100 mg by mouth at bedtime.      . sucralfate (CARAFATE) 1 G tablet Take 1 g by mouth 2 (two) times daily.      . valsartan (DIOVAN) 160 MG tablet Take 1 tablet (160 mg total) by mouth daily.  30 tablet  11    Exam:  BP 139/58  Pulse 108  Temp(Src) 98.4 F (36.9 C) (Oral)  Resp 14  SpO2  96% Gen: Well NAD HEENT: EOMI,  MMM, clear effusion right tympanic membrane. No erythema. Left ear is normal-appearing posterior pharynx mild cobblestoning. Lungs: Decreased breath sounds bilaterally. Poor air movement. Heart: RRR no MRG Abd: NABS, NT, ND Exts: Non edematous BL  LE, warm and well perfused.   Results for orders placed during the hospital encounter of 06/17/13 (from the past 24 hour(s))  POCT RAPID STREP A ([facility]RG CARE ONLY)     Status: None   Collection Time    06/17/13  9:24 AM      Result Value Range   Streptococcus, Group A Screen (Direct) NEGATIVE  NEGATIVE   Dg Chest 2 View  06/17/2013   CLINICAL DATA:  Sore throat, cough and congestion.  EXAM: CHEST  2 VIEW  COMPARISON:  05/30/2013.  FINDINGS: Trachea is midline. Heart size normal. Calcified granuloma at the left costophrenic angle. Lungs are otherwise clear. No pleural fluid. Degenerative changes are seen in the spine.  IMPRESSION: No acute findings.   Electronically Signed   By: Leanna Battles M.D.   On: 06/17/2013 10:14    Assessment and Plan: 62 y.o. female with bronchitis.  Plan to treat with prednisone albuterol and hydrocodone for cough medication. Will avoid antibiotics as patient is allergic to penicillin and has a QTC greater than 500 on her most recent EKG.  Azithromycin and fluoroquinolone should be used with great care in this situation and I feel that patient's symptoms do not warranted at this time.  Recommend patient followup with her primary care provider.  Additionally patient has a serous effusion of her right tympanic membrane. This should be well treated with prednisone however in the meantime we'll use Auralgan ear drops for symptomatic relief.        Rodolph Bong, MD 06/17/13 [phone]

## 2013-06-18 NOTE — Discharge Summary (Signed)
Family Medicine Teaching Service  Discharge Note : Attending Sara Neal MD Pager [phone] Office [phone] I have seen and examined this patient, reviewed their chart and discussed discharge planning wit the resident at the time of discharge. I agree with the discharge plan as above.  

## 2013-06-19 LAB — CULTURE, GROUP A STREP

## 2013-06-21 ENCOUNTER — Encounter: Payer: Self-pay | Admitting: Family Medicine

## 2013-06-21 ENCOUNTER — Ambulatory Visit (INDEPENDENT_AMBULATORY_CARE_PROVIDER_SITE_OTHER): Payer: No Typology Code available for payment source | Admitting: Family Medicine

## 2013-06-21 VITALS — BP 162/82 | HR 94 | Temp 98.3°F | Ht 62.0 in | Wt 138.0 lb

## 2013-06-21 DIAGNOSIS — R300 Dysuria: Secondary | ICD-10-CM

## 2013-06-21 DIAGNOSIS — M549 Dorsalgia, unspecified: Secondary | ICD-10-CM

## 2013-06-21 DIAGNOSIS — K625 Hemorrhage of anus and rectum: Secondary | ICD-10-CM

## 2013-06-21 DIAGNOSIS — IMO0002 Reserved for concepts with insufficient information to code with codable children: Secondary | ICD-10-CM

## 2013-06-21 LAB — POCT URINALYSIS DIPSTICK
Bilirubin, UA: NEGATIVE
Glucose, UA: NEGATIVE
Ketones, UA: NEGATIVE
Nitrite, UA: NEGATIVE
Protein, UA: NEGATIVE
Spec Grav, UA: 1.02
Urobilinogen, UA: 0.2
pH, UA: 7

## 2013-06-21 LAB — POCT UA - MICROSCOPIC ONLY: WBC, Ur, HPF, POC: >20

## 2013-06-21 MED ORDER — HYDROCORTISONE ACETATE 25 MG RE SUPP: 25.0000 mg | Freq: Two times a day (BID) | RECTAL | Status: DC

## 2013-06-21 MED ORDER — SULFAMETHOXAZOLE-TMP DS 800-160 MG PO TABS: 1.0000 | ORAL_TABLET | Freq: Two times a day (BID) | ORAL | Status: DC

## 2013-06-21 NOTE — Patient Instructions (Signed)
For the back pain, start taking the muscle relaxer: cyclobenzaprine or flexeril. If you don't have it, call the office and I will send a prescription.   I am sending a prescription for a urinary tract infection. I will call you if the urine culture results show something different.   For the bleeding in the stool, lets try taking miralax once a day to have more regular bowel movements. Also I am sending a prescription for anusol suppository to take for the hemorrhoids.  If the loose stools don't get any better, give the clinic a call so that they can let me know.

## 2013-06-22 LAB — URINE CULTURE: Colony Count: 4000

## 2013-06-23 ENCOUNTER — Telehealth: Payer: Self-pay | Admitting: Family Medicine

## 2013-06-23 NOTE — Telephone Encounter (Signed)
Called patient and left message stating that her urine culture did not yield any organisms. She should stop antibiotics. For any questions, she can call the clinic tomorrow during office hours.   Marena Chancy, PGY-3 Family Medicine Resident

## 2013-06-24 NOTE — Progress Notes (Signed)
Patient ID: CAMYRA VAETH    DOB: 1951/01/10, 62 y.o.   MRN: [id number] --- Subjective:  Novelle is a 62 y.o.female who presents with the following concerns:  - blood in stool: noticed it with wiping 1 week ago. She has been having itching and discomfort around the anus with bowel movements. She has small loose bowel movements throughout the day.She normally has constipation. She had a colonoscopy at [facility] in June 2014 which was normal, with repeat colonoscopy in 10 years.   - low back pain, ever since was discharged from the hospital. It is a constant dull pain with intermittent severe pain with movement. Located on the low left side of the lumbar spine. No radiation of pain in her legs or sharp shooting pain beyond the back. No new lower extremity weakness. States she has had a little bit of irritation with urination but no increased frequency or abdominal pain. No fevers or chills.   - follow up on shortness of breath: improved since her hospitalization. Feels like the metoprolol XL is helping her symptoms. She still has some shortness of breath though. Still has occasional waking up short of breath.   ROS: see HPI Past Medical History: reviewed and updated medications and allergies. Social History: Tobacco: none  Objective: Filed Vitals:   06/21/13 1212  BP: 162/82  Pulse: 94  Temp: 98.3 F (36.8 C)    Physical Examination:   General appearance - alert, well appearing, and in no distress Chest - clear to auscultation, no wheezes, rales or rhonchi, symmetric air entry Heart - normal rate, regular rhythm, normal S1, S2, no murmurs Abdomen - no tenderness, no distention Rectal - inspection: non thrombosed external hemorrhoids.  Back - tenderness along left paraspinus muscle with some increased muscle tone in small focal area

## 2013-06-25 DIAGNOSIS — K625 Hemorrhage of anus and rectum: Secondary | ICD-10-CM | POA: Insufficient documentation

## 2013-06-25 NOTE — Assessment & Plan Note (Signed)
Suspect back pain is from muscle spasm. UA inconclusive and given some urinary symptoms, empirically treated for UTI although less likely to be causing her back pain. Follow urine culture Will treat with flexeril, warm compresses and massage.  Follow up if not better in next month.

## 2013-06-25 NOTE — Assessment & Plan Note (Signed)
Suspect from hemorrhoids. Reassured by normal colonoscopy in JU[place]. Did not check FOBT because she had normal colonsocopy and it would not change current management.  - although patient has multiple small loose bowel movements, will try miralax to bulk up stools for more regular, formed stools.  - anusol

## 2013-06-26 ENCOUNTER — Ambulatory Visit: Payer: No Typology Code available for payment source | Admitting: Family Medicine

## 2013-06-26 ENCOUNTER — Ambulatory Visit (INDEPENDENT_AMBULATORY_CARE_PROVIDER_SITE_OTHER): Payer: No Typology Code available for payment source | Admitting: Cardiovascular Disease

## 2013-06-26 ENCOUNTER — Encounter: Payer: Self-pay | Admitting: Cardiovascular Disease

## 2013-06-26 VITALS — BP 130/84 | HR 106 | Ht 62.0 in | Wt 137.0 lb

## 2013-06-26 DIAGNOSIS — I5022 Chronic systolic (congestive) heart failure: Secondary | ICD-10-CM

## 2013-06-26 NOTE — Progress Notes (Signed)
HPI:  62 year-old woman presenting for hospital followup evaluation. She was hospitalized 05/31/2013 with chest pain and shortness of breath. She has had long-standing chest pain and shortness of breath, both occurring with minimal exertion. Because of her symptoms, newly diagnosed cardiomyopathy with left ventricular ejection fraction 35%, and left bundle branch block, I recommended cardiac catheterization. This demonstrated widely patent coronary arteries, normal intracardiac hemodynamics, and confirmed abnormal left ventricular systolic function.  The patient continues to have shortness of breath and generalized weakness. She also has had some sharp chest pains. She complains of dizziness. She denies edema, orthopnea, or PND. She has no exertional chest pain at this time. She denies cough, fever, or chills.  Outpatient Encounter Prescriptions as of 06/26/2013  Medication Sig  . albuterol (PROVENTIL HFA;VENTOLIN HFA) 108 (90 BASE) MCG/ACT inhaler Inhale 2 puffs into the lungs every 6 (six) hours as needed for wheezing.  [name] Kitchen albuterol (PROVENTIL) (2.5 MG/3ML) 0.083% nebulizer solution Take 2.5 mg by nebulization daily as needed for wheezing or shortness of breath.  . beclomethasone (QVAR) 80 MCG/ACT inhaler Inhale 1 puff into the lungs daily.  . carbidopa-levodopa (SINEMET) 25-100 MG per tablet Take 1 tablet by mouth 3 (three) times daily. For parkinsons  . cyclobenzaprine (FLEXERIL) 5 MG tablet Take 1 tablet (5 mg total) by mouth 3 (three) times daily as needed for muscle spasms.  [name] Kitchen EPINEPHrine (EPIPEN) 0.3 mg/0.3 mL SOAJ injection Inject 0.3 mg into the muscle as needed (allergic reaction).  . fish oil-omega-3 fatty acids 1000 MG capsule Take 1 g by mouth 2 (two) times daily.   [name] Kitchen gabapentin (NEURONTIN) 100 MG capsule Take 100-200 mg by mouth at bedtime as needed (for muscle pain).  [name] Kitchen HYDROcodone-acetaminophen (NORCO/VICODIN) 5-325 MG per tablet Take 0.5 tablets by mouth at bedtime as needed  (cough).  . meloxicam (MOBIC) 7.5 MG tablet Take 7.5 mg by mouth daily as needed for pain.  . metFORMIN (GLUCOPHAGE) 500 MG tablet Take 2 tablets (1,000 mg total) by mouth 2 (two) times daily with a meal.  . metoprolol succinate (TOPROL-XL) 25 MG 24 hr tablet Take 1 tablet (25 mg total) by mouth daily.  . nitroGLYCERIN (NITROSTAT) 0.4 MG SL tablet Place 1 tablet (0.4 mg total) under the tongue every 5 (five) minutes as needed for chest pain. Take one tablet and place under tongue if develop chest pain may repeat x2  . OVER THE COUNTER MEDICATION Take 1 tablet by mouth daily. laxative  . PRESCRIPTION MEDICATION Take 1 application by mouth daily as needed (for rosacea). Cream from medication assistance program  . ranitidine (ZANTAC) 150 MG tablet Take 1 tablet (150 mg total) by mouth 2 (two) times daily.  . rosuvastatin (CRESTOR) 20 MG tablet Take 1 tablet (20 mg total) by mouth daily.  . sertraline (ZOLOFT) 100 MG tablet Take 100 mg by mouth at bedtime.  . sucralfate (CARAFATE) 1 G tablet Take 1 g by mouth 2 (two) times daily. reflux  . sulfamethoxazole-trimethoprim (BACTRIM DS) 800-160 MG per tablet Take 1 tablet by mouth 2 (two) times daily. Take for 5 days. Ear drops  . valsartan (DIOVAN) 160 MG tablet Take 1 tablet (160 mg total) by mouth daily.  . hydrocortisone (ANUSOL-HC) 25 MG suppository Place 1 suppository (25 mg total) rectally 2 (two) times daily.  . [DISCONTINUED] albuterol (PROVENTIL) (2.5 MG/3ML) 0.083% nebulizer solution 2.5 mg.  . [DISCONTINUED] cyclobenzaprine (FLEXERIL) 5 MG tablet 5 mg.  . [DISCONTINUED] predniSONE (DELTASONE) 50 MG tablet Take 1 tablet (50  mg total) by mouth daily.  . [DISCONTINUED] sulfamethoxazole-trimethoprim (BACTRIM DS) 800-160 MG per tablet Take 1 tablet by mouth 2 (two) times daily. Take for 5 days    Allergies  Allergen Reactions  . Aspirin Other (See Comments)    Causes nose bleeds  . Penicillins Shortness Of Breath and Rash    Shortness of  Breath - Throat felt like it was closing.   Andres Ege [Conj Estrog-Medroxyprogest Ace] Shortness Of Breath    Throat swelling  . Ace Inhibitors Cough  . Simvastatin Other (See Comments)    Muscle aches    Past Medical History  Diagnosis Date  . Sleep apnea     No CPAP  . Gastritis   . Plantar fasciitis   . Diabetes mellitus   . Hyperlipidemia   . Asthma   . Urticaria   . Irregular heart beat   . Parkinson disease     ROS: Negative except as per HPI  BP 130/84  Pulse 106  Ht 5\' 2"  (1.575 m)  Wt 137 lb (62.143 kg)  BMI 25.05 kg/m2  PHYSICAL EXAM: Pt is alert and oriented, NAD, resting tremor HEENT: normal Neck: JVP - normal, carotids 2+= without bruits Lungs: CTA bilaterally CV: tachy and regular without murmur or gallop Abd: soft, NT, Positive BS, no hepatomegaly Ext: no C/C/E, distal pulses intact and equal Skin: warm/dry no rash  EKG:  Sinus tachycardia 106 beats per minute, right axis deviation, nonspecific intraventricular conduction delay.  Cardiac catheterization 05/31/2013: Procedural Findings:  Hemodynamics  RA mean of 2  RV 19/2  PA 19/12 with a mean of 14  PCWP mean of 5  LV 128/11  AO 131/73 with a mean of 99  Oxygen saturations:  PA 70  AO 96  Cardiac Output (Fick) 4.2  Cardiac Index (Fick) 2.6  Coronary angiography:  Coronary dominance: right  Left mainstem: Arises from the left cusp. Divides into the LAD and left circumflex. Widely patent.  Left anterior descending (LAD): The LAD wraps around the left ventricular apex. There is minor luminal irregularity in the proximal LAD.  The diagonal branches are patent without stenosis.  Left circumflex (LCx): The left circumflex is normal in caliber. There is a single large obtuse marginal with no stenosis present.  Right coronary artery (RCA): The RCA is dominant. This is a normal caliber vessel. There is no obstructive disease throughout. The PDA is patent.  Left ventriculography: There is diffuse  hypokinesis of the left ventricle. The estimated left ventricular ejection fraction is 35%. There is more marked hypokinesis of the anterolateral wall.  Final Conclusions:  1. 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 Normal intracardiac hemodynamics  3. Severe global and segmental left ventricular systolic dysfunction  Recommendations: Despite the patient's cardiomyopathy, her intracardiac hemodynamics are completely normal. In fact, her filling pressures are low. There is no indication for diuretics. Her coronaries are patent. Recommend continuation of her current medical therapy without changes.   ASSESSMENT AND PLAN: 1. Chronic systolic heart failure. The patient has left bundle branch block and global cardiomyopathy. I have carefully reviewed the patient's medical program which includes metoprolol succinate and valsartan. I think there is something noncardiac causing her fairly profound shortness of breath. She had the same complaints when she underwent cardiac catheterization which demonstrated normal cardiac output and normal intracardiac hemodynamics. I have recommended that she continue her current medical program. I would like her to have a followup echocardiogram in 3 months prior to her  return office visit.  [name] 06/26/2013 11:11 AM

## 2013-06-26 NOTE — Patient Instructions (Signed)
Your physician recommends that you schedule a follow-up appointment in: 3 MONTHS with Dr Excell Seltzer  Your physician has requested that you have an echocardiogram in 3 MONTHS. Echocardiography is a painless test that uses sound waves to create images of your heart. It provides your doctor with information about the size and shape of your heart and how well your heart's chambers and valves are working. This procedure takes approximately one hour. There are no restrictions for this procedure.  Your physician recommends that you continue on your current medications as directed. Please refer to the Current Medication list given to you today.

## 2013-07-03 ENCOUNTER — Encounter
Payer: No Typology Code available for payment source | Attending: Physical Medicine & Rehabilitation | Admitting: Physical Medicine & Rehabilitation

## 2013-07-03 ENCOUNTER — Encounter: Payer: Self-pay | Admitting: Physical Medicine & Rehabilitation

## 2013-07-03 VITALS — BP 130/63 | HR 93 | Resp 14 | Ht 62.5 in | Wt 137.0 lb

## 2013-07-03 DIAGNOSIS — M79609 Pain in unspecified limb: Secondary | ICD-10-CM | POA: Insufficient documentation

## 2013-07-03 DIAGNOSIS — M545 Low back pain, unspecified: Secondary | ICD-10-CM | POA: Insufficient documentation

## 2013-07-03 DIAGNOSIS — IMO0002 Reserved for concepts with insufficient information to code with codable children: Secondary | ICD-10-CM

## 2013-07-03 DIAGNOSIS — G6181 Chronic inflammatory demyelinating polyneuritis: Secondary | ICD-10-CM | POA: Insufficient documentation

## 2013-07-03 DIAGNOSIS — R209 Unspecified disturbances of skin sensation: Secondary | ICD-10-CM | POA: Insufficient documentation

## 2013-07-03 HISTORY — DX: Reserved for concepts with insufficient information to code with codable children: IMO0002

## 2013-07-03 NOTE — Progress Notes (Signed)
EMG/NCS BILATERAL LOWER EXTREMITIES INDICATION: LOW BACK PAIN WITH NUMBNESS AND PAIN IN THE LEGS AND FEET, RIGHT MORE THAN LEFT   NCS: I examined bilateral posterior tibial and peroneal motor nerves as well as the sural sensory nerves bilaterally. Distal latencies for all nerves were within normal range. Amplitudes of the right tibial nerve were substantially decreased on the right lower extremity. NCV's were near 40.m/s  and 44.80m/s for the peroneal nerves and 41.31m/s and 39.61m/s (right tibial nerve). See attached recordings for details.   EMG: A bilateral lower extremity radiculopathy screen was performed using a 75mm concentric needle. I examined the lumbar paraspinals, gluteus max, semimembranosus, vastus lateralis, gastrocnemius, tibialis anterior, and EHL muscles. I found increased insertional activity and polyphasia, increased amplitude in the right greater than left gastrocnemius, gluteus max, and hamstrings. Spontaneous fibrillation potentials were seen in the right hamstrings, gluteus max (to a mild extent), and left gastrocs. Right parapsinals did reveal some increase in insertional activity. The remaining muscles were normal for amplitude, insertional activity, spontaneous activity, recruitment, etc.    CONCLUSION: 1. Findings are most consistent with a right greater than left, chronic, S1 radiculopathy. Clinical correlation, with lumbar spine MRI would be indicated. 2. 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 No constent axonal findings were seen however across nerves.   Basic findings today were discussed with the patient

## 2013-07-15 ENCOUNTER — Telehealth: Payer: Self-pay

## 2013-07-15 NOTE — Telephone Encounter (Signed)
Patient called requesting results.  

## 2013-07-16 ENCOUNTER — Other Ambulatory Visit: Payer: Self-pay | Admitting: *Deleted

## 2013-07-16 DIAGNOSIS — IMO0002 Reserved for concepts with insufficient information to code with codable children: Secondary | ICD-10-CM

## 2013-07-16 NOTE — Telephone Encounter (Signed)
I spoke with the patient on the phone today after reviewing her EMG/nerve conduction study of bilateral lower extremities. 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 She also has evidence of early demyelinating peripheral neuropathy (mild) related to diabetes. No findings to suggest tarsal tunnel. Based on these findings I have recommended an MRI of her lumbar spine to evaluate degree of spinal stenosis present. Patient is instructed to followup with me in the days after that study to go over those results and delineate further treatment.

## 2013-07-16 NOTE — Telephone Encounter (Signed)
I am confused. i reviewed results with patient before she left the office. Report was completed that same day

## 2013-07-17 ENCOUNTER — Telehealth: Payer: Self-pay | Admitting: Family Medicine

## 2013-07-17 DIAGNOSIS — M47819 Spondylosis without myelopathy or radiculopathy, site unspecified: Secondary | ICD-10-CM

## 2013-07-17 MED ORDER — CYCLOBENZAPRINE HCL 5 MG PO TABS: 5.0000 mg | ORAL_TABLET | Freq: Three times a day (TID) | ORAL | Status: DC | PRN

## 2013-07-17 NOTE — Telephone Encounter (Signed)
Pt called and needs a refill on her Flexeril called in to her pharmacy on file. jw

## 2013-07-17 NOTE — Telephone Encounter (Signed)
Flexeril refilled  Marena Chancy, PGY-3 Family Medicine Resident

## 2013-07-17 NOTE — Telephone Encounter (Signed)
Will fwd to PCP.  Vela Render L, CMA  

## 2013-07-23 ENCOUNTER — Ambulatory Visit
Admission: RE | Admit: 2013-07-23 | Discharge: 2013-07-23 | Disposition: A | Discharge status: Home / Self Care | Payer: No Typology Code available for payment source | Source: Ambulatory Visit | Attending: Sports Medicine | Admitting: Sports Medicine

## 2013-07-23 DIAGNOSIS — IMO0002 Reserved for concepts with insufficient information to code with codable children: Secondary | ICD-10-CM

## 2013-07-30 ENCOUNTER — Encounter: Payer: Self-pay | Admitting: Sports Medicine

## 2013-07-30 ENCOUNTER — Ambulatory Visit (INDEPENDENT_AMBULATORY_CARE_PROVIDER_SITE_OTHER): Payer: No Typology Code available for payment source | Admitting: Sports Medicine

## 2013-07-30 VITALS — BP 123/72 | Ht 62.0 in | Wt 138.0 lb

## 2013-07-30 DIAGNOSIS — M48061 Spinal stenosis, lumbar region without neurogenic claudication: Secondary | ICD-10-CM

## 2013-07-30 NOTE — Patient Instructions (Signed)
Thanks for coming in today.  It looks like the pain in your legs is coming from her low back. You have a condition called spinal stenosis which compresses the nerves in your back causing pain in your legs. I need to refer you to a neurosurgeon so call me in January when you had insurance so that I can do that. In the meantime, you can continue to take your cyclobenzaprine as needed for spasm. Take it mainly at night as it may cause some drowsiness or dizziness.

## 2013-07-30 NOTE — Progress Notes (Signed)
Patient ID: Veronica Shaw, female   DOB: 1950/10/03, 62 y.o.   MRN: [id number]  Patient comes in today to go over her MRI findings of her lumbar spine. A previous EMG/nerve conduction study suggested chronic S1 radiculopathy, right greater than left. Also some mild early peripheral neuropathy due to her diabetes but the dominant finding was the chronic S1 radiculopathy. An MRI scan of her lumbar spine shows advanced bilateral facet degenerative changes and moderate central stenosis as well as foraminal stenosis bilaterally. Patient continues to have diffuse pain and weakness in both legs. It is present mainly with standing and walking for long periods of time.  Physical examination Mild bilateral calf atrophy but 5/5 strength with resisted plantarflexion bilaterally. Sensation is decreased bilaterally to light touch but equal. Walking with a limp and the assistance of a cane  Assessment/plan 1. Chronic bilateral lower leg pain with MRI and EMG findings consistent with spinal stenosis  -Given her long-standing symptoms I have recommended referral to neurosurgery. Patient will have insurance in January and I've asked that she call me with that information once available so that I can make the referral. In the meantime, she takes an occasional 5 mg Flexeril as needed for spasm. This does seem to help her. I've cautioned her about side effects including dizziness and drowsiness and I recommended that she take it mainly at night. I'll defer further workup and treatment to the discretion of neurosurgery.

## 2013-08-05 ENCOUNTER — Other Ambulatory Visit: Payer: Self-pay | Admitting: Family Medicine

## 2013-08-05 ENCOUNTER — Other Ambulatory Visit: Payer: Self-pay

## 2013-08-05 MED ORDER — METOPROLOL SUCCINATE ER 25 MG PO TB24: ORAL_TABLET | ORAL | Status: DC

## 2013-08-18 ENCOUNTER — Encounter (HOSPITAL_COMMUNITY): Payer: Self-pay | Admitting: Emergency Medicine

## 2013-08-18 DIAGNOSIS — M2550 Pain in unspecified joint: Secondary | ICD-10-CM | POA: Insufficient documentation

## 2013-08-18 DIAGNOSIS — G20 Parkinson's disease: Secondary | ICD-10-CM | POA: Insufficient documentation

## 2013-08-18 DIAGNOSIS — R000 Tachycardia, unspecified: Secondary | ICD-10-CM | POA: Insufficient documentation

## 2013-08-18 DIAGNOSIS — R0789 Other chest pain: Secondary | ICD-10-CM | POA: Insufficient documentation

## 2013-08-18 DIAGNOSIS — G20A1 Parkinson's disease without dyskinesia, without mention of fluctuations: Secondary | ICD-10-CM | POA: Insufficient documentation

## 2013-08-18 DIAGNOSIS — X500XXA Overexertion from strenuous movement or load, initial encounter: Secondary | ICD-10-CM | POA: Insufficient documentation

## 2013-08-18 DIAGNOSIS — I499 Cardiac arrhythmia, unspecified: Secondary | ICD-10-CM | POA: Insufficient documentation

## 2013-08-18 DIAGNOSIS — R0602 Shortness of breath: Secondary | ICD-10-CM | POA: Insufficient documentation

## 2013-08-18 DIAGNOSIS — M722 Plantar fascial fibromatosis: Secondary | ICD-10-CM | POA: Insufficient documentation

## 2013-08-18 DIAGNOSIS — G4730 Sleep apnea, unspecified: Secondary | ICD-10-CM | POA: Insufficient documentation

## 2013-08-18 DIAGNOSIS — Z79899 Other long term (current) drug therapy: Secondary | ICD-10-CM | POA: Insufficient documentation

## 2013-08-18 DIAGNOSIS — Y929 Unspecified place or not applicable: Secondary | ICD-10-CM | POA: Insufficient documentation

## 2013-08-18 DIAGNOSIS — Z8719 Personal history of other diseases of the digestive system: Secondary | ICD-10-CM | POA: Insufficient documentation

## 2013-08-18 DIAGNOSIS — E785 Hyperlipidemia, unspecified: Secondary | ICD-10-CM | POA: Insufficient documentation

## 2013-08-18 DIAGNOSIS — Y9389 Activity, other specified: Secondary | ICD-10-CM | POA: Insufficient documentation

## 2013-08-18 DIAGNOSIS — Z888 Allergy status to other drugs, medicaments and biological substances status: Secondary | ICD-10-CM | POA: Insufficient documentation

## 2013-08-18 DIAGNOSIS — M25519 Pain in unspecified shoulder: Secondary | ICD-10-CM | POA: Insufficient documentation

## 2013-08-18 DIAGNOSIS — Z880 Allergy status to penicillin: Secondary | ICD-10-CM | POA: Insufficient documentation

## 2013-08-18 DIAGNOSIS — J45909 Unspecified asthma, uncomplicated: Secondary | ICD-10-CM | POA: Insufficient documentation

## 2013-08-18 DIAGNOSIS — E119 Type 2 diabetes mellitus without complications: Secondary | ICD-10-CM | POA: Insufficient documentation

## 2013-08-18 NOTE — ED Notes (Signed)
The pt has had some lt sided chest pain since 2100 tonight  The pain is worse with  With movement and with breathing.

## 2013-08-19 ENCOUNTER — Other Ambulatory Visit: Payer: Self-pay

## 2013-08-19 ENCOUNTER — Emergency Department (HOSPITAL_COMMUNITY): Payer: 59

## 2013-08-19 ENCOUNTER — Emergency Department (HOSPITAL_COMMUNITY)
Admission: EM | Admit: 2013-08-19 | Discharge: 2013-08-19 | Disposition: A | Discharge status: Home / Self Care | Payer: 59 | Attending: Emergency Medicine | Admitting: Emergency Medicine

## 2013-08-19 DIAGNOSIS — M25512 Pain in left shoulder: Secondary | ICD-10-CM

## 2013-08-19 DIAGNOSIS — R0789 Other chest pain: Secondary | ICD-10-CM

## 2013-08-19 LAB — COMPREHENSIVE METABOLIC PANEL
ALT: 13 U/L (ref 0–35)
AST: 21 U/L (ref 0–37)
Albumin: 4 g/dL (ref 3.5–5.2)
Alkaline Phosphatase: 109 U/L (ref 39–117)
BUN: 17 mg/dL (ref 6–23)
CO2: 24 mEq/L (ref 19–32)
Calcium: 10 mg/dL (ref 8.4–10.5)
Chloride: 105 mEq/L (ref 96–112)
Creatinine, Ser: 0.81 mg/dL (ref 0.50–1.10)
GFR calc Af Amer: 87 mL/min — ABNORMAL LOW (ref >90)
GFR calc non Af Amer: 75 mL/min — ABNORMAL LOW (ref >90)
Glucose, Bld: 125 mg/dL — ABNORMAL HIGH (ref 70–99)
Potassium: 3.9 mEq/L (ref 3.7–5.3)
Sodium: 144 mEq/L (ref 137–147)
Total Bilirubin: 0.2 mg/dL — ABNORMAL LOW (ref 0.3–1.2)
Total Protein: 7.6 g/dL (ref 6.0–8.3)

## 2013-08-19 LAB — CBC WITH DIFFERENTIAL/PLATELET
Basophils Absolute: 0.1 10*3/uL (ref 0.0–0.1)
Basophils Relative: 2 % — ABNORMAL HIGH (ref 0–1)
Eosinophils Absolute: 0.4 10*3/uL (ref 0.0–0.7)
Eosinophils Relative: 6 % — ABNORMAL HIGH (ref 0–5)
HCT: 40.6 % (ref 36.0–46.0)
Hemoglobin: 13.9 g/dL (ref 12.0–15.0)
Lymphocytes Relative: 41 % (ref 12–46)
Lymphs Abs: 2.4 10*3/uL (ref 0.7–4.0)
MCH: 30.3 pg (ref 26.0–34.0)
MCHC: 34.2 g/dL (ref 30.0–36.0)
MCV: 88.5 fL (ref 78.0–100.0)
Monocytes Absolute: 0.9 10*3/uL (ref 0.1–1.0)
Monocytes Relative: 15 % — ABNORMAL HIGH (ref 3–12)
Neutro Abs: 2.1 10*3/uL (ref 1.7–7.7)
Neutrophils Relative %: 36 % — ABNORMAL LOW (ref 43–77)
Platelets: 176 10*3/uL (ref 150–400)
RBC: 4.59 MIL/uL (ref 3.87–5.11)
RDW: 12.6 % (ref 11.5–15.5)
WBC: 5.9 10*3/uL (ref 4.0–10.5)

## 2013-08-19 LAB — TROPONIN I
Troponin I: <0.3 ng/mL (ref <0.30)
Troponin I: <0.3 ng/mL (ref <0.30)

## 2013-08-19 MED ORDER — FENTANYL CITRATE 0.05 MG/ML IJ SOLN
50.0000 ug | Freq: Once | INTRAMUSCULAR | Status: AC
  Administered 2013-08-19: 50 ug via INTRAVENOUS
  Filled 2013-08-19: qty 2

## 2013-08-19 NOTE — ED Notes (Signed)
Pt. refused pain medication offered by EDP.

## 2013-08-19 NOTE — ED Notes (Signed)
Patient transported to X-ray 

## 2013-08-19 NOTE — Discharge Instructions (Signed)
Follow up with your heart doctor this week. If you were given medicines take as directed.  If you are on coumadin or contraceptives realize their levels and effectiveness is altered by many different medicines.  If you have any reaction (rash, tongues swelling, other) to the medicines stop taking and see a physician.   Please follow up as directed and return to the ER or see a physician for new or worsening symptoms.  Thank you.

## 2013-08-19 NOTE — ED Provider Notes (Signed)
CSN: [id number]     Arrival date & time 08/18/13  2352 History   First MD Initiated Contact with Patient 08/19/13 0020     Chief Complaint  Patient presents with  . Chest Pain   (Consider location/radiation/quality/duration/timing/severity/associated sxs/prior Treatment) HPI Comments: 63 yo female with dm, lipids, htn, asthma, lipids, CAD, CHF, parkinson's presents with left chest pain and left shoulder pain since 2100, pt was lifting something with left arm and felt pop and shoulder, now pain since with movement or coughing.  No diaphoresis or exertional component.  Recent cardiac workup and cath with CAD, no stents or MI hx.  No tearing sensation.    Patient is a 63 y.o. female presenting with chest pain. The history is provided by the patient.  Chest Pain Associated symptoms: shortness of breath (mild, intermittent, similar to previous)   Associated symptoms: no abdominal pain, no back pain, no diaphoresis, no fever, no headache and not vomiting     Past Medical History  Diagnosis Date  . Sleep apnea     No CPAP  . Gastritis   . Plantar fasciitis   . Diabetes mellitus   . Hyperlipidemia   . Asthma   . Urticaria   . Irregular heart beat   . Parkinson disease    Past Surgical History  Procedure Laterality Date  . Tubal ligation    . Cholecystectomy    . Bunionectomy     Family History  Problem Relation Age of Onset  . Coronary artery disease Father     Died age 48  . Coronary artery disease Mother     Died age 20   History  Substance Use Topics  . Smoking status: Passive Smoke Exposure - Never Smoker  . Smokeless tobacco: Not on file     Comment: exposed to smoke during child hood (parents)  . Alcohol Use: No   OB History   Grav Para Term Preterm Abortions TAB SAB Ect Mult Living                 Review of Systems  Constitutional: Negative for fever, chills and diaphoresis.  HENT: Negative for congestion.   Eyes: Negative for visual disturbance.  Respiratory:  Positive for shortness of breath (mild, intermittent, similar to previous).   Cardiovascular: Positive for chest pain.  Gastrointestinal: Negative for vomiting and abdominal pain.  Genitourinary: Negative for dysuria and flank pain.  Musculoskeletal: Positive for arthralgias. Negative for back pain, neck pain and neck stiffness.  Skin: Negative for rash.  Neurological: Negative for light-headedness and headaches.    Allergies  Aspirin; Penicillins; Prempro; Ace inhibitors; and Simvastatin  Home Medications   Current Outpatient Rx  Name  Route  Sig  Dispense  Refill  . albuterol (PROVENTIL HFA;VENTOLIN HFA) 108 (90 BASE) MCG/ACT inhaler   Inhalation   Inhale 2 puffs into the lungs every 6 (six) hours as needed for wheezing.         [name] Kitchen albuterol (PROVENTIL) (2.5 MG/3ML) 0.083% nebulizer solution   Nebulization   Take 2.5 mg by nebulization daily as needed for wheezing or shortness of breath.         . beclomethasone (QVAR) 80 MCG/ACT inhaler   Inhalation   Inhale 1 puff into the lungs daily as needed (shortness of breath).          . carbidopa-levodopa (SINEMET) 25-100 MG per tablet   Oral   Take 1 tablet by mouth 3 (three) times daily. For parkinsons         .  cyclobenzaprine (FLEXERIL) 5 MG tablet   Oral   Take 1 tablet (5 mg total) by mouth 3 (three) times daily as needed for muscle spasms.   30 tablet   1   . EPINEPHrine (EPIPEN) 0.3 mg/0.3 mL SOAJ injection   Intramuscular   Inject 0.3 mg into the muscle as needed (allergic reaction).         . fish oil-omega-3 fatty acids 1000 MG capsule   Oral   Take 1 g by mouth 2 (two) times daily.          [name] Kitchen gabapentin (NEURONTIN) 100 MG capsule   Oral   Take 100-200 mg by mouth at bedtime as needed (for muscle pain).         [name] Kitchen HYDROcodone-acetaminophen (NORCO/VICODIN) 5-325 MG per tablet   Oral   Take 0.5 tablets by mouth at bedtime as needed (cough).   6 tablet   0   . meloxicam (MOBIC) 7.5 MG tablet    Oral   Take 7.5 mg by mouth daily as needed for pain.         . metFORMIN (GLUCOPHAGE) 500 MG tablet   Oral   Take 2 tablets (1,000 mg total) by mouth 2 (two) times daily with a meal.   180 tablet   4   . metoprolol succinate (TOPROL-XL) 25 MG 24 hr tablet   Oral   Take 25 mg by mouth daily.         . nitroGLYCERIN (NITROSTAT) 0.4 MG SL tablet   Sublingual   Place 1 tablet (0.4 mg total) under the tongue every 5 (five) minutes as needed for chest pain. Take one tablet and place under tongue if develop chest pain may repeat x2   25 tablet   2   . OVER THE COUNTER MEDICATION   Oral   Take 1 tablet by mouth daily as needed (ocnstipation). laxative         . PRESCRIPTION MEDICATION   Oral   Take 1 application by mouth daily as needed (for rosacea). Cream from medication assistance program         . ranitidine (ZANTAC) 150 MG tablet   Oral   Take 1 tablet (150 mg total) by mouth 2 (two) times daily.   60 tablet   1   . rosuvastatin (CRESTOR) 20 MG tablet   Oral   Take 1 tablet (20 mg total) by mouth daily.   30 tablet   1   . sertraline (ZOLOFT) 100 MG tablet   Oral   Take 100 mg by mouth at bedtime.         . sucralfate (CARAFATE) 1 G tablet   Oral   Take 1 g by mouth 2 (two) times daily. reflux         . valsartan (DIOVAN) 160 MG tablet   Oral   Take 1 tablet (160 mg total) by mouth daily.   30 tablet   11    BP 130/61  Pulse 89  Temp(Src) 98 F (36.7 C) (Oral)  Resp 14  Ht 5\' 2"  (1.575 m)  Wt 134 lb (60.782 kg)  BMI 24.50 kg/m2  SpO2 97% Physical Exam  Nursing note and vitals reviewed. Constitutional: She is oriented to person, place, and time. She appears well-developed and well-nourished.  HENT:  Head: Normocephalic and atraumatic.  Eyes: Conjunctivae are normal. Right eye exhibits no discharge. Left eye exhibits no discharge.  Neck: Normal range of motion. Neck supple. No tracheal deviation  present.  Cardiovascular: Normal rate,  regular rhythm and intact distal pulses.   No murmur heard. Pulmonary/Chest: Effort normal and breath sounds normal.  Abdominal: Soft. She exhibits no distension. There is no tenderness. There is no guarding.  Musculoskeletal: She exhibits tenderness. She exhibits no edema.  Tender anterior left shoulder, worse with flexion and ext rotation, nv intact distal, no weakness  Neurological: She is alert and oriented to person, place, and time.  Skin: Skin is warm. No rash noted.  Psychiatric: She has a normal mood and affect.    ED Course  Procedures (including critical care time) Labs Review Labs Reviewed  CBC WITH DIFFERENTIAL - Abnormal; Notable for the following:    Neutrophils Relative % 36 (*)    Monocytes Relative 15 (*)    Eosinophils Relative 6 (*)    Basophils Relative 2 (*)    All other components within normal limits  COMPREHENSIVE METABOLIC PANEL - Abnormal; Notable for the following:    Glucose, Bld 125 (*)    Total Bilirubin 0.2 (*)    GFR calc non Af Amer 75 (*)    GFR calc Af Amer 87 (*)    All other components within normal limits  TROPONIN I  TROPONIN I   Imaging Review Dg Chest 2 View  08/19/2013   CLINICAL DATA:  Chest pain.  EXAM: CHEST  2 VIEW  COMPARISON:  Chest radiograph performed 06/17/2013  FINDINGS: The lungs are well-aerated and clear. There is no evidence of focal opacification, pleural effusion or pneumothorax.  The heart is normal in size; the mediastinal contour is within normal limits. No acute osseous abnormalities are seen. Clips are noted within the right upper quadrant, reflecting prior cholecystectomy.  IMPRESSION: No acute cardiopulmonary process seen.   Electronically Signed   By: Garald Balding M.D.   On: 08/19/2013 01:13   Dg Shoulder Left  08/19/2013   CLINICAL DATA:  Left-sided chest pain and anterior left shoulder pain.  EXAM: LEFT SHOULDER - 2+ VIEW  COMPARISON:  None.  FINDINGS: There is no evidence of fracture or dislocation. The left  humeral head is seated within the glenoid fossa. The acromioclavicular joint is unremarkable in appearance. No significant soft tissue abnormalities are seen. The visualized portions of the left lung are clear.  IMPRESSION: No evidence of fracture or dislocation.   Electronically Signed   By: Garald Balding M.D.   On: 08/19/2013 02:32    EKG Interpretation   None     Not in muse  Date: 08/19/2013  Rate: 101  Rhythm: sinus tachycardia  QRS Axis: left  Intervals: QT prolonged  ST/T Wave abnormalities: nonspecific ST changes and ST elevations anteriorly  Conduction Disutrbances:nonspecific intraventricular conduction delay old septal infarct  Narrative Interpretation:   Old EKG Reviewed: changes noted overall similar  Repeat  Date: 08/19/2013  Rate: 100  Rhythm: sinus tachycardia  QRS Axis: normal  Intervals: QT prolonged  ST/T Wave abnormalities: nonspecific ST changes and nonspecific T wave changes  Conduction Disutrbances:nonspecific intraventricular conduction delay old septal infarct  Narrative Interpretation:   Old EKG Reviewed: unchanged    MDM   1. Atypical chest pain   2. Left shoulder pain    Pt has known CAD/ risk factors however HPI consistent with MSK. Plan for cardiac screening, xrays and observation in ED. Mild improvement on recheck.   Reviewed recent cath, no acute findings, no significant CAD.  Discussed close fup with cardiology and pcp, pt agrees. EKG similar to previous.  Results and differential diagnosis were discussed with the patient. Close follow up outpatient was discussed, patient comfortable with the plan.   Diagnosis: above    Mariea Clonts, MD 08/19/13 [phone]

## 2013-08-20 ENCOUNTER — Encounter: Payer: Self-pay | Admitting: *Deleted

## 2013-08-20 NOTE — Patient Instructions (Signed)
DR [place] [phone] WED JAN 21ST 2015 10AM

## 2013-08-26 ENCOUNTER — Ambulatory Visit: Payer: 59 | Admitting: Family Medicine

## 2013-08-27 NOTE — Telephone Encounter (Signed)
PCP has already been changed to Dr. Otis Dials.  Nolene Ebbs, RN

## 2013-08-28 ENCOUNTER — Ambulatory Visit: Payer: 59 | Admitting: Family Medicine

## 2013-09-09 ENCOUNTER — Ambulatory Visit: Payer: 59 | Admitting: Physical Therapy

## 2013-09-13 ENCOUNTER — Other Ambulatory Visit: Payer: Self-pay | Admitting: Family Medicine

## 2013-09-16 ENCOUNTER — Ambulatory Visit: Payer: 59 | Admitting: Physical Therapy

## 2013-09-20 ENCOUNTER — Ambulatory Visit (INDEPENDENT_AMBULATORY_CARE_PROVIDER_SITE_OTHER): Payer: 59 | Admitting: Cardiovascular Disease

## 2013-09-20 ENCOUNTER — Ambulatory Visit (HOSPITAL_COMMUNITY): Payer: 59 | Attending: Cardiovascular Disease | Admitting: Radiology

## 2013-09-20 ENCOUNTER — Encounter: Payer: Self-pay | Admitting: Cardiovascular Disease

## 2013-09-20 VITALS — BP 130/78 | HR 82 | Ht 62.0 in | Wt 134.0 lb

## 2013-09-20 DIAGNOSIS — E785 Hyperlipidemia, unspecified: Secondary | ICD-10-CM | POA: Insufficient documentation

## 2013-09-20 DIAGNOSIS — R079 Chest pain, unspecified: Secondary | ICD-10-CM

## 2013-09-20 DIAGNOSIS — R072 Precordial pain: Secondary | ICD-10-CM

## 2013-09-20 DIAGNOSIS — I428 Other cardiomyopathies: Secondary | ICD-10-CM

## 2013-09-20 DIAGNOSIS — I059 Rheumatic mitral valve disease, unspecified: Secondary | ICD-10-CM | POA: Insufficient documentation

## 2013-09-20 DIAGNOSIS — R0989 Other specified symptoms and signs involving the circulatory and respiratory systems: Secondary | ICD-10-CM | POA: Insufficient documentation

## 2013-09-20 DIAGNOSIS — E119 Type 2 diabetes mellitus without complications: Secondary | ICD-10-CM | POA: Insufficient documentation

## 2013-09-20 DIAGNOSIS — J45909 Unspecified asthma, uncomplicated: Secondary | ICD-10-CM | POA: Insufficient documentation

## 2013-09-20 DIAGNOSIS — I5022 Chronic systolic (congestive) heart failure: Secondary | ICD-10-CM

## 2013-09-20 DIAGNOSIS — I509 Heart failure, unspecified: Secondary | ICD-10-CM

## 2013-09-20 DIAGNOSIS — R0609 Other forms of dyspnea: Secondary | ICD-10-CM | POA: Insufficient documentation

## 2013-09-20 MED ORDER — METOPROLOL SUCCINATE ER 50 MG PO TB24: 50.0000 mg | ORAL_TABLET | Freq: Every day | ORAL | Status: DC

## 2013-09-20 NOTE — Patient Instructions (Addendum)
Your physician recommends that you schedule a follow-up appointment in: 3 months--Scheduled for January 03, 2014 at [place] has recommended you make the following change in your medication:Increase Metoprolol succinate to 50 mg by mouth daily

## 2013-09-20 NOTE — Progress Notes (Signed)
Echocardiogram performed.  

## 2013-09-20 NOTE — Progress Notes (Signed)
HPI:  63 year-old woman presenting for followup evaluation. 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 She was hospitalized in October 2014 with chest pain or shortness of breath. At that time she was diagnosed with a cardiomyopathy with LVEF estimated at 35% as well as a left bundle branch block pattern on EKG. Cardiac catheterization demonstrated patent coronary arteries, normal intracardiac hemodynamics, and severe LV dysfunction.  Patient has been placed on medical therapy. She continues to take metoprolol succinate and valsartan. She is feeling somewhat better but still has shortness of breath with exertion. She now has New York Heart Association class II symptoms. She's had no recent chest pain. She admits to lightheadedness with postural change. She's had no syncope or palpitations. She denies leg swelling, orthopnea, or PND. She is compliant with her medications.  Outpatient Encounter Prescriptions as of 09/20/2013  Medication Sig  . albuterol (PROVENTIL HFA;VENTOLIN HFA) 108 (90 BASE) MCG/ACT inhaler Inhale 2 puffs into the lungs every 6 (six) hours as needed for wheezing.  [name] Kitchen albuterol (PROVENTIL) (2.5 MG/3ML) 0.083% nebulizer solution Take 2.5 mg by nebulization daily as needed for wheezing or shortness of breath.  . beclomethasone (QVAR) 80 MCG/ACT inhaler Inhale 1 puff into the lungs daily as needed (shortness of breath).   . carbidopa-levodopa (SINEMET) 25-100 MG per tablet Take 1 tablet by mouth 3 (three) times daily. For parkinsons  . cyclobenzaprine (FLEXERIL) 5 MG tablet Take 1 tablet (5 mg total) by mouth 3 (three) times daily as needed for muscle spasms.  [name] Kitchen EPINEPHrine (EPIPEN) 0.3 mg/0.3 mL SOAJ injection Inject 0.3 mg into the muscle as needed (allergic reaction).  . fish oil-omega-3 fatty acids 1000 MG capsule Take 1 g by mouth 2 (two) times daily.   [name] Kitchen gabapentin (NEURONTIN) 100 MG capsule Take 100-200 mg by mouth at  bedtime as needed (for muscle pain).  [name] Kitchen HYDROcodone-acetaminophen (NORCO/VICODIN) 5-325 MG per tablet Take 0.5 tablets by mouth at bedtime as needed (cough).  . meloxicam (MOBIC) 7.5 MG tablet Take 7.5 mg by mouth daily as needed for pain.  . metFORMIN (GLUCOPHAGE) 500 MG tablet Take 2 tablets (1,000 mg total) by mouth 2 (two) times daily with a meal.  . metoprolol succinate (TOPROL-XL) 25 MG 24 hr tablet Take 25 mg by mouth daily.  . nitroGLYCERIN (NITROSTAT) 0.4 MG SL tablet Place 1 tablet (0.4 mg total) under the tongue every 5 (five) minutes as needed for chest pain. Take one tablet and place under tongue if develop chest pain may repeat x2  . OVER THE COUNTER MEDICATION Take 1 tablet by mouth daily as needed (ocnstipation). laxative  . PRESCRIPTION MEDICATION Take 1 application by mouth daily as needed (for rosacea). Cream from medication assistance program  . ranitidine (ZANTAC) 150 MG tablet TAKE ONE TABLET BY MOUTH TWICE DAILY  . rosuvastatin (CRESTOR) 20 MG tablet Take 1 tablet (20 mg total) by mouth daily.  . sertraline (ZOLOFT) 100 MG tablet Take 100 mg by mouth at bedtime.  . sucralfate (CARAFATE) 1 G tablet Take 1 g by mouth 2 (two) times daily. reflux  . valsartan (DIOVAN) 160 MG tablet Take 1 tablet (160 mg total) by mouth daily.    Allergies  Allergen Reactions  . Aspirin Other (See Comments)    Causes nose bleeds  . Penicillins Shortness Of Breath and Rash    Shortness of Breath - Throat felt like it was closing.   [name] [Conj Estrog-Medroxyprogest Ace] Shortness Of  Breath    Throat swelling  . Ace Inhibitors Cough  . Simvastatin Other (See Comments)    Muscle aches    Past Medical History  Diagnosis Date  . Sleep apnea     No CPAP  . Gastritis   . Plantar fasciitis   . Diabetes mellitus   . Hyperlipidemia   . Asthma   . Urticaria   . Irregular heart beat   . Parkinson disease     ROS: Negative except as per HPI  BP 130/78  Pulse 82  Ht 5\' 2"  (1.575  m)  Wt 134 lb (60.782 kg)  BMI 24.50 kg/m2  SpO2 96%  PHYSICAL EXAM: Pt is alert and oriented, NAD HEENT: normal Neck: JVP - normal, carotids 2+= without bruits Lungs: CTA bilaterally CV: RRR with a paradoxical S2 without murmur Abd: soft, NT, Positive BS, no hepatomegaly Ext: no C/C/E, distal pulses intact and equal Skin: warm/dry no rash   2D Echo: ------------------------------------------------------------ Left ventricle: average global strain was abnormal at -12% The cavity size was normal. Systolic function was moderately to severely reduced. The estimated ejection fraction was in the range of 30% to 35%. Diffuse hypokinesis. Regional wall motion abnormalities: There is severe hypokinesis to akinesis of the entire anteroseptal myocardium. The tissue Doppler parameters were abnormal. Features are consistent with a pseudonormal left ventricular filling pattern, with concomitant abnormal relaxation and increased filling pressure (grade 2 diastolic dysfunction).  ------------------------------------------------------------ Aortic valve: Poorly visualized. Trileaflet; normal thickness leaflets. Mobility was not restricted. Doppler: Transvalvular velocity was within the normal range. There was no stenosis. No regurgitation.  ------------------------------------------------------------ Aorta: Aortic root: The aortic root was normal in size.  ------------------------------------------------------------ Mitral valve: Structurally normal valve. Mobility was not restricted. Doppler: Transvalvular velocity was within the normal range. There was no evidence for stenosis. Mild regurgitation. Peak gradient: 14mm Hg (D).  ------------------------------------------------------------ Left atrium: The atrium was at the upper limits of normal in size.  ------------------------------------------------------------ Right ventricle: The cavity size was normal. Wall thickness was  normal. Systolic function was normal.  ------------------------------------------------------------ Pulmonic valve: Structurally normal valve. Cusp separation was normal. Doppler: Transvalvular velocity was within the normal range. There was no evidence for stenosis. No regurgitation.  ------------------------------------------------------------ Tricuspid valve: Structurally normal valve. Doppler: Transvalvular velocity was within the normal range. Trivial regurgitation.  ------------------------------------------------------------ Pulmonary artery: The main pulmonary artery was normal-sized. Systolic pressure was within the normal range.  ------------------------------------------------------------ Right atrium: The atrium was normal in size.  ------------------------------------------------------------ Pericardium: There was no pericardial effusion.  ------------------------------------------------------------ Systemic veins: Inferior vena cava: The vessel was normal in size.   ASSESSMENT AND PLAN: Chronic systolic heart failure. NYHA Class 2-3 symptoms. The patient had an echocardiogram this morning as outlined above. She is on medical therapy long-acting beta blocker and ARB. I have recommended that she increase her metoprolol succinate 50 mg.  Will refer her for consideration of CRT-D therapy with her left bundle branch block and severe LV dysfunction. I would like to see her back in followup in 3 months.  Sherren Mocha 09/20/2013 4:12 PM

## 2013-09-23 ENCOUNTER — Other Ambulatory Visit: Payer: Self-pay | Admitting: *Deleted

## 2013-09-24 ENCOUNTER — Telehealth: Payer: Self-pay | Admitting: Family Medicine

## 2013-09-24 MED ORDER — GABAPENTIN 100 MG PO CAPS: 100.0000 mg | ORAL_CAPSULE | Freq: Every evening | ORAL | Status: DC | PRN

## 2013-09-24 NOTE — Telephone Encounter (Signed)
Please let patient know that the Rx for gabapentin is ready for her to pick up at the office. There was no pharmacy listed for me to send it directly to.   Thank you! Liam Graham, PGY-3 Family Medicine Resident

## 2013-09-24 NOTE — Telephone Encounter (Signed)
Called pt.LMVM to call back. Please see message. Thanks. .Veronica Shaw  

## 2013-09-25 NOTE — Telephone Encounter (Signed)
LMVM for patient to pick up her Rx.  Veronica Shaw, Veronica Shaw, [name]

## 2013-10-02 ENCOUNTER — Other Ambulatory Visit: Payer: Self-pay

## 2013-10-02 ENCOUNTER — Ambulatory Visit (INDEPENDENT_AMBULATORY_CARE_PROVIDER_SITE_OTHER): Payer: 59 | Admitting: Family Medicine

## 2013-10-02 ENCOUNTER — Encounter: Payer: Self-pay | Admitting: Family Medicine

## 2013-10-02 VITALS — BP 130/71 | HR 79 | Temp 98.5°F | Ht 62.0 in | Wt 136.8 lb

## 2013-10-02 DIAGNOSIS — I5022 Chronic systolic (congestive) heart failure: Secondary | ICD-10-CM

## 2013-10-02 DIAGNOSIS — E119 Type 2 diabetes mellitus without complications: Secondary | ICD-10-CM

## 2013-10-02 DIAGNOSIS — IMO0002 Reserved for concepts with insufficient information to code with codable children: Secondary | ICD-10-CM

## 2013-10-02 DIAGNOSIS — M47819 Spondylosis without myelopathy or radiculopathy, site unspecified: Secondary | ICD-10-CM

## 2013-10-02 DIAGNOSIS — F329 Major depressive disorder, single episode, unspecified: Secondary | ICD-10-CM

## 2013-10-02 DIAGNOSIS — Z1231 Encounter for screening mammogram for malignant neoplasm of breast: Secondary | ICD-10-CM

## 2013-10-02 DIAGNOSIS — M479 Spondylosis, unspecified: Secondary | ICD-10-CM

## 2013-10-02 DIAGNOSIS — I10 Essential (primary) hypertension: Secondary | ICD-10-CM

## 2013-10-02 DIAGNOSIS — I509 Heart failure, unspecified: Secondary | ICD-10-CM

## 2013-10-02 LAB — POCT GLYCOSYLATED HEMOGLOBIN (HGB A1C): Hemoglobin A1C: 6.7

## 2013-10-02 MED ORDER — SERTRALINE HCL 100 MG PO TABS: 100.0000 mg | ORAL_TABLET | Freq: Every day | ORAL | Status: DC

## 2013-10-02 MED ORDER — CYCLOBENZAPRINE HCL 5 MG PO TABS: 5.0000 mg | ORAL_TABLET | Freq: Three times a day (TID) | ORAL | Status: DC | PRN

## 2013-10-02 NOTE — Assessment & Plan Note (Signed)
Refilled zoloft. phq9 not assessed today but patient reported mood as good. Possibility of titrating dose upwards if needed in the future.

## 2013-10-02 NOTE — Assessment & Plan Note (Addendum)
PT referral placed. PT will also likely be beneficial for Parkinson's.  Refilled flexeril 5mg  daily with caution for side effects.

## 2013-10-02 NOTE — Patient Instructions (Signed)
Your A1C was good at 6.7.  Follow up in 3 months.

## 2013-10-02 NOTE — Assessment & Plan Note (Signed)
At goal for diabetes.

## 2013-10-02 NOTE — Assessment & Plan Note (Signed)
Well controlled. Better than last time.  Continue metformin 1000mg  bid. If has more recurrence of hypoglycemia will decrease metformin although it should not be contributing to hypoglycemia.  Referral to ophthalmology for yearly screen Microfilament testing done Lipid panel up to date on crestor

## 2013-10-02 NOTE — Progress Notes (Signed)
Patient ID: Veronica Shaw    DOB: [birth year], 63 y.o.   MRN: [id number] --- Subjective:  Veronica Shaw is a 63 y.o.female who presents for follow up on diabetes.   # Diabetes: - medications: metformin 1000mg  bid - missed doses in 1 week:none - hypoglycemic events: once a month down to 50's.  - CBG's at home: only checks when feeling bad - changes in vision? Worsening blurriness and difficulty seeing - numbness or tingling in lower extremities? Chronic numbness and tingling in lower extremities.  - sores or ulcers? none - seen by podiatry?no - last eye check? Last year - diet: watches her intake  - exercise: occasional  # Request for referral for PT for low back pain with radiculopathy and Parkinson's.   ROS: see HPI Past Medical History: reviewed and updated medications and allergies. Social History: Tobacco: none  Objective: Filed Vitals:   10/02/13 1018  BP: 130/71  Pulse: 79  Temp: 98.5 F (36.9 C)    Physical Examination:   General appearance - alert, well appearing, and in no distress Nose - normal and patent, no erythema, discharge or polyps Mouth - mucous membranes moist, pharynx normal without lesions Neck - supple, no significant adenopathy Chest - clear to auscultation, no wheezes, rales or rhonchi, symmetric air entry Heart - normal rate, regular rhythm, normal S1, S2, no murmurs, rubs, clicks or gallops Extremities - peripheral pulses normal, no pedal edema No microfilament sensation in great toes bilaterally, no sensation on medial aspect of right foot, decreased sensation at bottom of foot bilaterally, otherwise normal.  No sores or ulcerations

## 2013-10-10 ENCOUNTER — Other Ambulatory Visit: Payer: Self-pay | Admitting: Nurse Practitioner

## 2013-10-10 ENCOUNTER — Ambulatory Visit: Payer: 59 | Attending: Family Medicine | Admitting: Physical Therapy

## 2013-10-10 DIAGNOSIS — M545 Low back pain, unspecified: Secondary | ICD-10-CM | POA: Insufficient documentation

## 2013-10-10 DIAGNOSIS — IMO0001 Reserved for inherently not codable concepts without codable children: Secondary | ICD-10-CM | POA: Insufficient documentation

## 2013-10-10 DIAGNOSIS — R269 Unspecified abnormalities of gait and mobility: Secondary | ICD-10-CM | POA: Insufficient documentation

## 2013-10-10 DIAGNOSIS — M25569 Pain in unspecified knee: Secondary | ICD-10-CM | POA: Insufficient documentation

## 2013-10-10 DIAGNOSIS — R293 Abnormal posture: Secondary | ICD-10-CM | POA: Insufficient documentation

## 2013-10-10 DIAGNOSIS — R262 Difficulty in walking, not elsewhere classified: Secondary | ICD-10-CM | POA: Insufficient documentation

## 2013-10-10 DIAGNOSIS — M2550 Pain in unspecified joint: Secondary | ICD-10-CM | POA: Insufficient documentation

## 2013-10-10 DIAGNOSIS — M2560 Stiffness of unspecified joint, not elsewhere classified: Secondary | ICD-10-CM | POA: Insufficient documentation

## 2013-10-10 DIAGNOSIS — R5381 Other malaise: Secondary | ICD-10-CM | POA: Insufficient documentation

## 2013-10-10 MED ORDER — FUROSEMIDE 40 MG PO TABS: 40.0000 mg | ORAL_TABLET | Freq: Every day | ORAL | Status: DC

## 2013-10-10 MED ORDER — POTASSIUM CHLORIDE CRYS ER 20 MEQ PO TBCR: 20.0000 meq | EXTENDED_RELEASE_TABLET | Freq: Every day | ORAL | Status: DC

## 2013-10-14 ENCOUNTER — Telehealth: Payer: Self-pay | Admitting: Nurse Practitioner

## 2013-10-14 DIAGNOSIS — I519 Heart disease, unspecified: Secondary | ICD-10-CM

## 2013-10-14 NOTE — Telephone Encounter (Signed)
Message copied by Emmaline Life on Mon Oct 14, 2013  1:15 PM ------      Message from: Sherren Mocha      Created: Thu Oct 10, 2013  6:18 PM       I had initially reviewed these echo findings with the patient during her office visit. I contacted her today to discuss my recommendation for a cardiac MRI prior to her next office visit so that we can get a very accurate assessment of her LV ejection fraction. She is complaining of more shortness of breath. She states that this is different from her asthma. I have recommended that she start furosemide 40 mg daily and K-Dur 20 milliequivalents daily. She is scheduled to return in 2 months. She should continue her other medications as scheduled. ------

## 2013-10-14 NOTE — Telephone Encounter (Signed)
Rx for Furosemide and KDur sent on 3/12.  Orders placed for Cardiac MRI with and without contrast and message sent to [facility] to schedule patient for this procedure.

## 2013-10-15 ENCOUNTER — Other Ambulatory Visit: Payer: Self-pay | Admitting: *Deleted

## 2013-10-15 NOTE — Telephone Encounter (Signed)
Could you please call patient to see where she would like her albuterol filled? The refill request is coming from the health department but I cannot e-prescribe it. Since patient now has insurance, she may be able to receive her medicines at another pharmacy.  Thank you!  Liam Graham, PGY-3 Family Medicine Resident

## 2013-10-16 ENCOUNTER — Encounter: Payer: 59 | Admitting: Physical Therapy

## 2013-10-18 ENCOUNTER — Other Ambulatory Visit: Payer: Self-pay | Admitting: *Deleted

## 2013-10-18 MED ORDER — ALBUTEROL SULFATE HFA 108 (90 BASE) MCG/ACT IN AERS: 2.0000 | INHALATION_SPRAY | Freq: Four times a day (QID) | RESPIRATORY_TRACT | Status: DC | PRN

## 2013-10-18 MED ORDER — GABAPENTIN 100 MG PO CAPS: ORAL_CAPSULE | ORAL | Status: DC

## 2013-10-18 MED ORDER — GABAPENTIN 100 MG PO CAPS: 100.0000 mg | ORAL_CAPSULE | Freq: Every evening | ORAL | Status: DC | PRN

## 2013-10-18 NOTE — Telephone Encounter (Signed)
Sent Rx to health department already but will resend Rx to her pharmacy of choice.   Liam Graham, PGY-3 Family Medicine Resident

## 2013-10-18 NOTE — Telephone Encounter (Addendum)
Called pt and she would like Rx sent in to [facility] on [address].  Derl Barrow, RN

## 2013-10-18 NOTE — Addendum Note (Signed)
Addended by: Kandis Nab on: 10/18/2013 10:27 AM   Modules accepted: Orders

## 2013-10-21 ENCOUNTER — Encounter: Payer: 59 | Admitting: Physical Therapy

## 2013-10-23 ENCOUNTER — Ambulatory Visit: Payer: 59

## 2013-10-23 ENCOUNTER — Encounter: Payer: 59 | Admitting: Physical Therapy

## 2013-10-25 ENCOUNTER — Ambulatory Visit
Admission: RE | Admit: 2013-10-25 | Discharge: 2013-10-25 | Disposition: A | Discharge status: Home / Self Care | Payer: Self-pay | Source: Ambulatory Visit

## 2013-10-25 DIAGNOSIS — Z1231 Encounter for screening mammogram for malignant neoplasm of breast: Secondary | ICD-10-CM

## 2013-10-30 ENCOUNTER — Encounter: Payer: 59 | Admitting: Physical Therapy

## 2013-10-30 ENCOUNTER — Telehealth: Payer: Self-pay | Admitting: Cardiovascular Disease

## 2013-10-30 NOTE — Telephone Encounter (Signed)
Received call from patient she stated she is sob at present with chest tightness.Rates sob and chest tightness # 7.Stated she just don't feel good.Stated she has been sob x 2 weeks and it is worse.Advised she needs to go to [facility] ER.

## 2013-10-30 NOTE — Telephone Encounter (Signed)
New Message  Pt called states that she has experienced SOB within the last week, Tightness of chest, Dizziness and is requesting an appointment. No appointments available.. Please assist.

## 2013-10-31 ENCOUNTER — Emergency Department (HOSPITAL_COMMUNITY): Payer: 59

## 2013-10-31 ENCOUNTER — Emergency Department (HOSPITAL_COMMUNITY)
Admission: EM | Admit: 2013-10-31 | Discharge: 2013-10-31 | Disposition: A | Discharge status: Home / Self Care | Payer: 59 | Attending: Emergency Medicine | Admitting: Emergency Medicine

## 2013-10-31 ENCOUNTER — Other Ambulatory Visit: Payer: Self-pay

## 2013-10-31 ENCOUNTER — Ambulatory Visit: Payer: 59

## 2013-10-31 ENCOUNTER — Encounter (HOSPITAL_COMMUNITY): Payer: Self-pay | Admitting: Emergency Medicine

## 2013-10-31 DIAGNOSIS — F411 Generalized anxiety disorder: Secondary | ICD-10-CM | POA: Insufficient documentation

## 2013-10-31 DIAGNOSIS — E119 Type 2 diabetes mellitus without complications: Secondary | ICD-10-CM | POA: Insufficient documentation

## 2013-10-31 DIAGNOSIS — Z79899 Other long term (current) drug therapy: Secondary | ICD-10-CM | POA: Insufficient documentation

## 2013-10-31 DIAGNOSIS — G20 Parkinson's disease: Secondary | ICD-10-CM | POA: Insufficient documentation

## 2013-10-31 DIAGNOSIS — I509 Heart failure, unspecified: Secondary | ICD-10-CM | POA: Insufficient documentation

## 2013-10-31 DIAGNOSIS — Z791 Long term (current) use of non-steroidal anti-inflammatories (NSAID): Secondary | ICD-10-CM | POA: Insufficient documentation

## 2013-10-31 DIAGNOSIS — G20A1 Parkinson's disease without dyskinesia, without mention of fluctuations: Secondary | ICD-10-CM | POA: Insufficient documentation

## 2013-10-31 DIAGNOSIS — J45901 Unspecified asthma with (acute) exacerbation: Secondary | ICD-10-CM | POA: Insufficient documentation

## 2013-10-31 DIAGNOSIS — IMO0002 Reserved for concepts with insufficient information to code with codable children: Secondary | ICD-10-CM | POA: Insufficient documentation

## 2013-10-31 DIAGNOSIS — Z8719 Personal history of other diseases of the digestive system: Secondary | ICD-10-CM | POA: Insufficient documentation

## 2013-10-31 DIAGNOSIS — Z8739 Personal history of other diseases of the musculoskeletal system and connective tissue: Secondary | ICD-10-CM | POA: Insufficient documentation

## 2013-10-31 DIAGNOSIS — Z880 Allergy status to penicillin: Secondary | ICD-10-CM | POA: Insufficient documentation

## 2013-10-31 DIAGNOSIS — R42 Dizziness and giddiness: Secondary | ICD-10-CM | POA: Insufficient documentation

## 2013-10-31 DIAGNOSIS — E785 Hyperlipidemia, unspecified: Secondary | ICD-10-CM | POA: Insufficient documentation

## 2013-10-31 DIAGNOSIS — Z872 Personal history of diseases of the skin and subcutaneous tissue: Secondary | ICD-10-CM | POA: Insufficient documentation

## 2013-10-31 DIAGNOSIS — R0789 Other chest pain: Secondary | ICD-10-CM

## 2013-10-31 LAB — URINE MICROSCOPIC-ADD ON

## 2013-10-31 LAB — TROPONIN I
Troponin I: <0.3 ng/mL (ref <0.30)
Troponin I: <0.3 ng/mL (ref <0.30)

## 2013-10-31 LAB — URINALYSIS, ROUTINE W REFLEX MICROSCOPIC
Bilirubin Urine: NEGATIVE
Glucose, UA: NEGATIVE mg/dL
Hgb urine dipstick: NEGATIVE
Ketones, ur: NEGATIVE mg/dL
Nitrite: NEGATIVE
Protein, ur: NEGATIVE mg/dL
Specific Gravity, Urine: 1.011 (ref 1.005–1.030)
Urobilinogen, UA: 0.2 mg/dL (ref 0.0–1.0)
pH: 8.5 — ABNORMAL HIGH (ref 5.0–8.0)

## 2013-10-31 LAB — COMPREHENSIVE METABOLIC PANEL
ALT: 15 U/L (ref 0–35)
AST: 24 U/L (ref 0–37)
Albumin: 4 g/dL (ref 3.5–5.2)
Alkaline Phosphatase: 117 U/L (ref 39–117)
BUN: 19 mg/dL (ref 6–23)
CO2: 24 mEq/L (ref 19–32)
Calcium: 10.3 mg/dL (ref 8.4–10.5)
Chloride: 102 mEq/L (ref 96–112)
Creatinine, Ser: 0.71 mg/dL (ref 0.50–1.10)
GFR calc Af Amer: >90 mL/min (ref >90)
GFR calc non Af Amer: 89 mL/min — ABNORMAL LOW (ref >90)
Glucose, Bld: 137 mg/dL — ABNORMAL HIGH (ref 70–99)
Potassium: 4 mEq/L (ref 3.7–5.3)
Sodium: 141 mEq/L (ref 137–147)
Total Bilirubin: 0.5 mg/dL (ref 0.3–1.2)
Total Protein: 7.7 g/dL (ref 6.0–8.3)

## 2013-10-31 LAB — CBC WITH DIFFERENTIAL/PLATELET
Basophils Absolute: 0 10*3/uL (ref 0.0–0.1)
Basophils Relative: 1 % (ref 0–1)
Eosinophils Absolute: 0.4 10*3/uL (ref 0.0–0.7)
Eosinophils Relative: 6 % — ABNORMAL HIGH (ref 0–5)
HCT: 44 % (ref 36.0–46.0)
Hemoglobin: 15.1 g/dL — ABNORMAL HIGH (ref 12.0–15.0)
Lymphocytes Relative: 34 % (ref 12–46)
Lymphs Abs: 2 10*3/uL (ref 0.7–4.0)
MCH: 30.1 pg (ref 26.0–34.0)
MCHC: 34.3 g/dL (ref 30.0–36.0)
MCV: 87.8 fL (ref 78.0–100.0)
Monocytes Absolute: 0.6 10*3/uL (ref 0.1–1.0)
Monocytes Relative: 10 % (ref 3–12)
Neutro Abs: 3 10*3/uL (ref 1.7–7.7)
Neutrophils Relative %: 49 % (ref 43–77)
Platelets: 198 10*3/uL (ref 150–400)
RBC: 5.01 MIL/uL (ref 3.87–5.11)
RDW: 12.3 % (ref 11.5–15.5)
WBC: 6 10*3/uL (ref 4.0–10.5)

## 2013-10-31 LAB — PRO B NATRIURETIC PEPTIDE: Pro B Natriuretic peptide (BNP): 526.1 pg/mL — ABNORMAL HIGH (ref 0–125)

## 2013-10-31 NOTE — ED Notes (Signed)
Pt reports she is feeling better than before, no pain at this time.

## 2013-10-31 NOTE — Discharge Instructions (Signed)
Call your cardiologist for followup as soon as possible.  Return here as needed.  Your testing here today didn't show any abnormalities

## 2013-10-31 NOTE — ED Notes (Addendum)
Pt c/o chest pain x 1week, worsening yesterday. CP worse with deep breathing.  Pain is located center left.  Pt wakes up with SOB and chest tightness and pressure.  Pt reports pain in both arms. Pt states she has had these pains before and has hx of CHF.   Pt states she started a diuretic about a month ago (hasn't taken before in the past).  Denies current n/v but some in the past week.  Pt reports feeling lightheaded yesterday.  Pt called dr and he suggested she come to the ED.   Pt rates pain 5/10.  No acute distress, respirations equal, labored.  Skin warm and dry.

## 2013-10-31 NOTE — ED Notes (Addendum)
Pt reports mid chest pain for over a week. Having dizziness and possible syncopal episode yesterday. ekg done at triage, airway intact.reports hx of chf, denies any swelling to extremities.

## 2013-10-31 NOTE — ED Provider Notes (Signed)
CSN: [id number]     Arrival date & time 10/31/13  3818 History   First MD Initiated Contact with Patient 10/31/13 0940     Chief Complaint  Patient presents with  . Chest Pain  . Dizziness     (Consider location/radiation/quality/duration/timing/severity/associated sxs/prior Treatment) HPI: [name] is a 63 year old woman with past medical history of CHF, DM, Parkinson disease, and asthma who presents to the Emergency Department with chief complaint of increasing dyspnea on exertion.  She reports that she has felt increasing chest tightness and shortness of breath over the last week.  She states that she is not able to walk around her kitchen without getting short of breath now, that she has to sleep on stacked pillows, and that she is waking up frequently gasping for breath.  She is also coughing up a lot of clear phlegm, explaining that she always needs a trash can nearby to cough into.  She has been feeling generally unwell and has not been eating as much as she used to.  She denies fevers and swelling in her lower extremities.  She has an occasional, brief "random" sharp chest pain, but otherwise denies chest pain.  She did feel lightheaded yesterday but did not pass out.  She called her PCP yesterday to explain these symptoms and they told her to come to the ED.  She has been using her albuterol inhaler up to 8 times per day with only momentary relief of her symptoms.  She was started on lasix approximately one month.  Past Medical History  Diagnosis Date  . Sleep apnea     No CPAP  . Gastritis   . Plantar fasciitis   . Diabetes mellitus   . Hyperlipidemia   . Asthma   . Urticaria   . Irregular heart beat   . Parkinson disease   . CHF (congestive heart failure)    Past Surgical History  Procedure Laterality Date  . Tubal ligation    . Cholecystectomy    . Bunionectomy     Family History  Problem Relation Age of Onset  . Coronary artery disease Father     Died age 44   . Coronary artery disease Mother     Died age 45   History  Substance Use Topics  . Smoking status: Passive Smoke Exposure - Never Smoker  . Smokeless tobacco: Not on file     Comment: exposed to smoke during child hood (parents)  . Alcohol Use: No   OB History   Grav Para Term Preterm Abortions TAB SAB Ect Mult Living                 Review of Systems  All other systems negative except as documented in the HPI. All pertinent positives and negatives as reviewed in the HPI.  Allergies  Aspirin; Penicillins; Prempro; Ace inhibitors; and Simvastatin  Home Medications   Current Outpatient Rx  Name  Route  Sig  Dispense  Refill  . albuterol (PROVENTIL HFA;VENTOLIN HFA) 108 (90 BASE) MCG/ACT inhaler   Inhalation   Inhale 2 puffs into the lungs every 6 (six) hours as needed for wheezing.   1 Inhaler   1   . albuterol (PROVENTIL) (2.5 MG/3ML) 0.083% nebulizer solution   Nebulization   Take 2.5 mg by nebulization daily as needed for wheezing or shortness of breath.         . beclomethasone (QVAR) 80 MCG/ACT inhaler   Inhalation   Inhale 1 puff  into the lungs daily as needed (shortness of breath).          . carbidopa-levodopa (SINEMET) 25-100 MG per tablet   Oral   Take 1 tablet by mouth 3 (three) times daily. For parkinsons         . cyclobenzaprine (FLEXERIL) 5 MG tablet   Oral   Take 1 tablet (5 mg total) by mouth 3 (three) times daily as needed for muscle spasms.   30 tablet   1   . EPINEPHrine (EPIPEN) 0.3 mg/0.3 mL SOAJ injection   Intramuscular   Inject 0.3 mg into the muscle as needed (allergic reaction).         . fish oil-omega-3 fatty acids 1000 MG capsule   Oral   Take 1 g by mouth 2 (two) times daily.          . furosemide (LASIX) 40 MG tablet   Oral   Take 1 tablet (40 mg total) by mouth daily.   90 tablet   3   . gabapentin (NEURONTIN) 100 MG capsule   Oral   Take 1-2 capsules (100-200 mg total) by mouth at bedtime as needed (for  muscle pain).   60 capsule   3   . gabapentin (NEURONTIN) 100 MG capsule      1-2 capsules (100-200 mg total) by mouth at bedtime as needed (for muscle pain).   90 capsule   3   . HYDROcodone-acetaminophen (NORCO/VICODIN) 5-325 MG per tablet   Oral   Take 0.5 tablets by mouth at bedtime as needed (cough).   6 tablet   0   . meloxicam (MOBIC) 7.5 MG tablet   Oral   Take 7.5 mg by mouth daily as needed for pain.         . metFORMIN (GLUCOPHAGE) 500 MG tablet   Oral   Take 2 tablets (1,000 mg total) by mouth 2 (two) times daily with a meal.   180 tablet   4   . metoprolol succinate (TOPROL-XL) 50 MG 24 hr tablet   Oral   Take 1 tablet (50 mg total) by mouth daily.   30 tablet   6   . nitroGLYCERIN (NITROSTAT) 0.4 MG SL tablet   Sublingual   Place 1 tablet (0.4 mg total) under the tongue every 5 (five) minutes as needed for chest pain. Take one tablet and place under tongue if develop chest pain may repeat x2   25 tablet   2   . OVER THE COUNTER MEDICATION   Oral   Take 1 tablet by mouth daily as needed (ocnstipation). laxative         . potassium chloride SA (K-DUR,KLOR-CON) 20 MEQ tablet   Oral   Take 1 tablet (20 mEq total) by mouth daily.   90 tablet   3   . PRESCRIPTION MEDICATION   Oral   Take 1 application by mouth daily as needed (for rosacea). Cream from medication assistance program         . ranitidine (ZANTAC) 150 MG tablet      TAKE ONE TABLET BY MOUTH TWICE DAILY   60 tablet   0   . rosuvastatin (CRESTOR) 20 MG tablet   Oral   Take 1 tablet (20 mg total) by mouth daily.   30 tablet   1   . sertraline (ZOLOFT) 100 MG tablet   Oral   Take 1 tablet (100 mg total) by mouth at bedtime.   30 tablet  6   . sucralfate (CARAFATE) 1 G tablet   Oral   Take 1 g by mouth 2 (two) times daily. reflux         . valsartan (DIOVAN) 160 MG tablet   Oral   Take 1 tablet (160 mg total) by mouth daily.   30 tablet   11    There were no  vitals taken for this visit. Physical Exam  Nursing note and vitals reviewed. Constitutional: She is oriented to person, place, and time. She appears well-developed and well-nourished. No distress.  HENT:  Head: Normocephalic and atraumatic.  Mouth/Throat: Oropharynx is clear and moist.  Eyes: Pupils are equal, round, and reactive to light.  Neck: Normal range of motion. Neck supple. No JVD present.  Cardiovascular: Normal rate, regular rhythm and normal heart sounds.  Exam reveals no gallop and no friction rub.   No murmur heard. Pulmonary/Chest: Effort normal and breath sounds normal. No respiratory distress. She has no wheezes. She has no rales.  Abdominal: Soft. Bowel sounds are normal. She exhibits no distension. There is no tenderness. There is no rebound.  Musculoskeletal: She exhibits no edema.  Neurological: She is alert and oriented to person, place, and time.  Skin: Skin is warm and dry. No rash noted. No erythema.  Psychiatric: Her mood appears anxious.    ED Course  Procedures (including critical care time) Labs Review Labs Reviewed  CBC WITH DIFFERENTIAL - Abnormal; Notable for the following:    Hemoglobin 15.1 (*)    Eosinophils Relative 6 (*)    All other components within normal limits  COMPREHENSIVE METABOLIC PANEL - Abnormal; Notable for the following:    Glucose, Bld 137 (*)    GFR calc non Af Amer 89 (*)    All other components within normal limits  URINALYSIS, ROUTINE W REFLEX MICROSCOPIC - Abnormal; Notable for the following:    pH 8.5 (*)    Leukocytes, UA LARGE (*)    All other components within normal limits  PRO B NATRIURETIC PEPTIDE - Abnormal; Notable for the following:    Pro B Natriuretic peptide (BNP) 526.1 (*)    All other components within normal limits  URINE MICROSCOPIC-ADD ON - Abnormal; Notable for the following:    Squamous Epithelial / LPF FEW (*)    Bacteria, UA FEW (*)    All other components within normal limits  TROPONIN I   TROPONIN I    Patient be referred back to her cardiologist.  The patient has been stable here in the emergency room.  She said she is feeling better.  The patient, states she would like to be discharged home and follow up with her radiologist as advised.  Patient that she's had 2 sets of negative cardiac enzymes.  Her EKG does not show any acute changes.  Her discomfort as been ongoing for over a week.  I feel this may be a further progression of her cardiomyopathy, but no acute emergency is present at this time     Brent General, PA-C 10/31/13 1431

## 2013-11-01 ENCOUNTER — Ambulatory Visit (HOSPITAL_COMMUNITY)
Admission: RE | Admit: 2013-11-01 | Discharge: 2013-11-01 | Disposition: A | Discharge status: Home / Self Care | Payer: 59 | Source: Ambulatory Visit | Attending: Cardiovascular Disease | Admitting: Cardiovascular Disease

## 2013-11-01 DIAGNOSIS — I519 Heart disease, unspecified: Secondary | ICD-10-CM

## 2013-11-01 DIAGNOSIS — I428 Other cardiomyopathies: Secondary | ICD-10-CM

## 2013-11-01 MED ORDER — GADOBENATE DIMEGLUMINE 529 MG/ML IV SOLN
20.0000 mL | Freq: Once | INTRAVENOUS | Status: AC
  Administered 2013-11-01: 20 mL via INTRAVENOUS

## 2013-11-04 ENCOUNTER — Telehealth: Payer: Self-pay | Admitting: Cardiovascular Disease

## 2013-11-04 ENCOUNTER — Encounter: Payer: Self-pay | Admitting: Family Medicine

## 2013-11-04 ENCOUNTER — Ambulatory Visit (INDEPENDENT_AMBULATORY_CARE_PROVIDER_SITE_OTHER): Payer: 59 | Admitting: Family Medicine

## 2013-11-04 VITALS — BP 135/76 | HR 72 | Ht 62.0 in | Wt 134.6 lb

## 2013-11-04 DIAGNOSIS — R0600 Dyspnea, unspecified: Secondary | ICD-10-CM

## 2013-11-04 DIAGNOSIS — I509 Heart failure, unspecified: Secondary | ICD-10-CM

## 2013-11-04 DIAGNOSIS — I5022 Chronic systolic (congestive) heart failure: Secondary | ICD-10-CM

## 2013-11-04 DIAGNOSIS — R0609 Other forms of dyspnea: Secondary | ICD-10-CM

## 2013-11-04 DIAGNOSIS — R0989 Other specified symptoms and signs involving the circulatory and respiratory systems: Secondary | ICD-10-CM

## 2013-11-04 DIAGNOSIS — R0689 Other abnormalities of breathing: Secondary | ICD-10-CM

## 2013-11-04 LAB — IRON AND TIBC
%SAT: 29 % (ref 20–55)
Iron: 99 ug/dL (ref 42–145)
TIBC: 345 ug/dL (ref 250–470)
UIBC: 246 ug/dL (ref 125–400)

## 2013-11-04 NOTE — ED Provider Notes (Signed)
Medical screening examination/treatment/procedure(s) were conducted as a shared visit with non-physician practitioner(s) and myself.  I personally evaluated the patient during the encounter.   EKG Interpretation None     62yF with dyspnea. Known cardiomyopathy. Catheterization this past Halloween which showed:  1. 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 Normal intracardiac hemodynamics  3. Severe global and segmental left ventricular systolic dysfunction  W/u today in ED has been fairly unremarkable. She is in no distress and HD stable. I feel she is stable for further outpt management/follow-up.   Virgel Manifold, MD 11/04/13 [phone]

## 2013-11-04 NOTE — Telephone Encounter (Signed)
Returned call to patient MRI results not available.Message sent to Dr.Cooper's nurse.

## 2013-11-04 NOTE — Progress Notes (Signed)
Patient ID: Terie Purser    DOB: 1951/05/17, 63 y.o.   MRN: [id number] --- Subjective:  Chamara is a 63 y.o.female with systolic CHF (EF: 93%), DM, Parkinson's, who presents for follow up from recent ED visit on 10/31/13 for shortness of breath and chest pain that became worst on the day of the ED visit. She states that she was unable to walk to her kitchen without becoming very short winded. She used her albuterol multiple times with only minimal relief.  In the ED, she had 2 sets of normal troponins, no acute process on CXR and stable pro BNP of 526.1. EKG showed normal sinus rhythm without evidence of left bundle branch block seen in November and January.  Since then, she still feels tired and run down with dyspnea with short distances and at night time. She cannot lay down to sleep and uses 2-3 pillows. She denies any swelling. She denies any recent chest pain. She states that today is mildly improved.  She is compliant with her medications.  ROS: see HPI Past Medical History: reviewed and updated medications and allergies. Social History: Tobacco: none  Objective: Filed Vitals:   11/04/13 1128  BP: 135/76  Pulse: 72    Physical Examination:   General appearance - alert, well appearing, and in no distress Chest - clear to auscultation, no wheezes, rales or rhonchi, symmetric air entry Heart - normal rate, regular rhythm, S1S2, no murmur appreciated Extremities - no edema

## 2013-11-04 NOTE — Telephone Encounter (Signed)
New message ° ° ° ° ° °Want MRI results  °

## 2013-11-04 NOTE — Patient Instructions (Signed)
Please call Dr. Antionette Char appointment to ask to be seen sooner than June since you have been having more shortness of breath and fatigue.

## 2013-11-05 ENCOUNTER — Telehealth: Payer: Self-pay | Admitting: Family Medicine

## 2013-11-05 LAB — FERRITIN: Ferritin: 70 ng/mL (ref 10–291)

## 2013-11-05 NOTE — Assessment & Plan Note (Addendum)
Likely related to systolic CHF. Weight is stable. Normal troponins and bnp at baseline in the ED.  Since she has worsening dyspnea and fatigue having required ED stay, recommended that she make an appointment with cardiology sooner than her next scheduled appointment in June.  Continue current medical management until evaluated by cardiology.

## 2013-11-05 NOTE — Telephone Encounter (Signed)
PT notified.  Veronica Shaw, Veronica Shaw, Veronica Shaw

## 2013-11-05 NOTE — Telephone Encounter (Signed)
Please let patient know that the labs she had done yesterday were normal. Thank you!  Liam Graham, PGY-3 Family Medicine Resident

## 2013-11-07 NOTE — Telephone Encounter (Signed)
I spoke with the pt and made her aware of Cardiac MRI results.  I have scheduled the pt to see Dr Burt Knack on 11/11/13 to discuss these results and follow-up on ER visit.

## 2013-11-08 ENCOUNTER — Encounter: Payer: Self-pay | Admitting: Family Medicine

## 2013-11-11 ENCOUNTER — Encounter: Payer: Self-pay | Admitting: Cardiovascular Disease

## 2013-11-11 ENCOUNTER — Other Ambulatory Visit: Payer: Self-pay | Admitting: *Deleted

## 2013-11-11 ENCOUNTER — Ambulatory Visit (INDEPENDENT_AMBULATORY_CARE_PROVIDER_SITE_OTHER): Payer: 59 | Admitting: Cardiovascular Disease

## 2013-11-11 VITALS — BP 130/66 | HR 80 | Ht 62.5 in | Wt 134.0 lb

## 2013-11-11 DIAGNOSIS — I5022 Chronic systolic (congestive) heart failure: Secondary | ICD-10-CM

## 2013-11-11 MED ORDER — RANITIDINE HCL 150 MG PO TABS: 150.0000 mg | ORAL_TABLET | Freq: Two times a day (BID) | ORAL | Status: DC

## 2013-11-11 NOTE — Patient Instructions (Signed)
You have been referred to [name] for discussion of ICD.  Your physician wants you to follow-up in: 4 MONTHS with Dr Burt Knack (August 2015). You will receive a reminder letter in the mail two months in advance. If you don't receive a letter, please call our office to schedule the follow-up appointment.  Your physician recommends that you continue on your current medications as directed. Please refer to the Current Medication list given to you today.

## 2013-11-11 NOTE — Progress Notes (Signed)
HPI:  63 year-old woman presenting for followup evaluation. 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 She was hospitalized in October 2014 with chest pain or shortness of breath. At that time she was diagnosed with a cardiomyopathy with LVEF estimated at 35% as well as a left bundle branch block pattern on EKG. Cardiac catheterization demonstrated patent coronary arteries, normal intracardiac hemodynamics, and severe LV dysfunction.  The patient was seen in February of this year. At that time her medical program was escalated. She is taking metoprolol succinate which was increased to 50 mg daily. She also takes valsartan. Because of severe LV dysfunction and LVEF by echo around 30-35%, I ordered a cardiac MRI for accurate assessment of LV function and consideration of EP referral for ICD/CRT-D. Findings as outlined below:  Cardiac MRI 11/01/2013: FINDINGS: 1. Left ventricle is mildly dilated with normal thickness and moderately to severely decreased systolic function (QPYP=95%). There is diffuse hypokinesis with pronounced paradoxical septal motion consistent with LBBB.  LVEDV: 138 ml  LVESV: 90 ml  SV: 47 ml  CO: 3.1 L/minute  Myocardial mass: 134 cm  LVESD: 55 mm  2. Right ventricle has normal size, thickness and function (RVEF=61%). There are no regional wall motion abnormalities.  RVEDV: 79 ml  RVESV: 31 ml  SV: 48 ml  CO: 3.1 L/minute  3. There is a small focal area of late gadolinium enhancement at basal anteroseptum (at the point of the superior RV attachment) that might represent RV volume overload or a focal infiltrative disease such as sarcoidosis. This wouldn't explain the degree of LV dysfunction.  4. Mild mitral regurgitation. Mild tricuspid regurgitation.  5. Normal size pulmonary artery 24 mm, normal size aortic root 28 mm.  6. Normal bi-atrial size.  IMPRESSION: 1. Mildly dilated left  ventricle with normal wall thickness and moderately to severely impaired systolic function (LVEF 09%). Diffuse hypokinesis with paradoxical septal motion consistent with LBBB.  2. A small focal area of late gadolinium enhancement at basal anteroseptum that might represent RV volume overload or a focal infiltrative disease such as sarcoidosis, but certainly wouldn't explain the degree of of LV dysfunction.  These findings are consistent with non-ischemic dilated cardiomyopathy.  3. Normal right ventricular size and function (RVEF 61%).  4. Mild mitral and tricuspid regurgitation.  Since that visit, the patient was evaluated in the emergency department on April 2 for shortness of breath. There were no medication changes made and she is referred back for followup evaluation.   Patient continues to have shortness of breath and lightheadedness with activity. She has episodic chest pains. She denies edema, orthopnea, or PND. Symptoms occur with low-level activity such as walking on flat ground for short distance. She hasn't noticed much difference in symptoms with increase of her beta blocker. She denies wheezing or cough.   Outpatient Encounter Prescriptions as of 11/11/2013  Medication Sig  . albuterol (PROVENTIL HFA;VENTOLIN HFA) 108 (90 BASE) MCG/ACT inhaler Inhale 2 puffs into the lungs every 6 (six) hours as needed for wheezing.  [name] Kitchen albuterol (PROVENTIL) (2.5 MG/3ML) 0.083% nebulizer solution Take 2.5 mg by nebulization daily as needed for wheezing or shortness of breath.  . beclomethasone (QVAR) 80 MCG/ACT inhaler Inhale 1 puff into the lungs daily as needed (shortness of breath).   . carbidopa-levodopa (SINEMET) 25-100 MG per tablet Take 1 tablet by mouth 3 (three) times daily. For parkinsons  . carbidopa-levodopa (SINEMET) 25-100 MG per tablet Take 1.5 tablets by  mouth.  . cyclobenzaprine (FLEXERIL) 5 MG tablet Take 1 tablet (5 mg total) by mouth 3 (three) times daily as needed for  muscle spasms.  [name] Kitchen EPINEPHrine (EPI-PEN) 0.3 mg/0.3 mL SOAJ injection Inject into the muscle.  [name] Kitchen EPINEPHrine (EPIPEN) 0.3 mg/0.3 mL SOAJ injection Inject 0.3 mg into the muscle as needed (allergic reaction).  . furosemide (LASIX) 40 MG tablet Take 1 tablet (40 mg total) by mouth daily.  [name] Kitchen gabapentin (NEURONTIN) 100 MG capsule Take 1-2 capsules (100-200 mg total) by mouth at bedtime as needed (for muscle pain).  [name] Kitchen gabapentin (NEURONTIN) 100 MG capsule Take by mouth.  [name] Kitchen HYDROcodone-acetaminophen (NORCO/VICODIN) 5-325 MG per tablet Take by mouth.  . meloxicam (MOBIC) 7.5 MG tablet Take 7.5 mg by mouth daily as needed for pain.  . metFORMIN (GLUCOPHAGE) 500 MG tablet Take 2 tablets (1,000 mg total) by mouth 2 (two) times daily with a meal.  . metFORMIN (GLUCOPHAGE) 500 MG tablet Take 1,000 mg by mouth.  . metoprolol succinate (TOPROL-XL) 50 MG 24 hr tablet Take 1 tablet (50 mg total) by mouth daily.  . metoprolol succinate (TOPROL-XL) 50 MG 24 hr tablet Take by mouth.  . nitroGLYCERIN (NITROSTAT) 0.4 MG SL tablet Place 1 tablet (0.4 mg total) under the tongue every 5 (five) minutes as needed for chest pain. Take one tablet and place under tongue if develop chest pain may repeat x2  . potassium chloride SA (K-DUR,KLOR-CON) 20 MEQ tablet Take 1 tablet (20 mEq total) by mouth daily.  . potassium chloride SA (K-DUR,KLOR-CON) 20 MEQ tablet Take by mouth.  [name] Kitchen PRESCRIPTION MEDICATION Take 1 application by mouth daily as needed (for rosacea). Cream from medication assistance program  . ranitidine (ZANTAC) 150 MG tablet Take 150 mg by mouth 2 (two) times daily.  . ranitidine (ZANTAC) 150 MG tablet TAKE ONE TABLET BY MOUTH TWICE DAILY  . sertraline (ZOLOFT) 100 MG tablet Take 1 tablet (100 mg total) by mouth at bedtime.  . valsartan (DIOVAN) 160 MG tablet Take 1 tablet (160 mg total) by mouth daily.    Allergies  Allergen Reactions  . Aspirin Other (See Comments) and Swelling    Causes nose bleeds Causes  nose bleeds  . Penicillins Shortness Of Breath and Rash    Shortness of Breath - Throat felt like it was closing.   [name] [Conj Estrog-Medroxyprogest Ace] Shortness Of Breath    Throat swelling  . Ace Inhibitors Cough  . Simvastatin Other (See Comments) and Rash    Muscle aches    Past Medical History  Diagnosis Date  . Sleep apnea     No CPAP  . Gastritis   . Plantar fasciitis   . Diabetes mellitus   . Hyperlipidemia   . Asthma   . Urticaria   . Irregular heart beat   . Parkinson disease   . CHF (congestive heart failure)     ROS: Negative except as per HPI  BP 130/66  Pulse 80  Ht 5' 2.5" (1.588 m)  Wt 134 lb (60.782 kg)  BMI 24.10 kg/m2  PHYSICAL EXAM: Pt is alert and oriented, NAD HEENT: normal Neck: JVP - normal, carotids 2+= without bruits Lungs: CTA bilaterally CV: RRR without murmur or gallop Abd: soft, NT, Positive BS, no hepatomegaly Ext: no C/C/E, distal pulses intact and equal Skin: warm/dry no rash  2-D echocardiogram 09/20/2013: Left ventricle: average global strain was abnormal at -12% The cavity size was normal. Systolic function was moderately to severely reduced. The estimated ejection  fraction was in the range of 30% to 35%. Diffuse hypokinesis. Regional wall motion abnormalities: There is severe hypokinesis to akinesis of the entire anteroseptal myocardium. The tissue Doppler parameters were abnormal. Features are consistent with a pseudonormal left ventricular filling pattern, with concomitant abnormal relaxation and increased filling pressure (grade 2 diastolic dysfunction).  ------------------------------------------------------------ Aortic valve: Poorly visualized. Trileaflet; normal thickness leaflets. Mobility was not restricted. Doppler: Transvalvular velocity was within the normal range. There was no stenosis. No regurgitation.  ------------------------------------------------------------ Aorta: Aortic root: The aortic  root was normal in size.  ------------------------------------------------------------ Mitral valve: Structurally normal valve. Mobility was not restricted. Doppler: Transvalvular velocity was within the normal range. There was no evidence for stenosis. Mild regurgitation. Peak gradient: 20mm Hg (D).  ------------------------------------------------------------ Left atrium: The atrium was at the upper limits of normal in size.  ------------------------------------------------------------ Right ventricle: The cavity size was normal. Wall thickness was normal. Systolic function was normal.  ------------------------------------------------------------ Pulmonic valve: Structurally normal valve. Cusp separation was normal. Doppler: Transvalvular velocity was within the normal range. There was no evidence for stenosis. No regurgitation.  ------------------------------------------------------------ Tricuspid valve: Structurally normal valve. Doppler: Transvalvular velocity was within the normal range. Trivial regurgitation.  ------------------------------------------------------------ Pulmonary artery: The main pulmonary artery was normal-sized. Systolic pressure was within the normal range.  ------------------------------------------------------------ Right atrium: The atrium was normal in size.  ------------------------------------------------------------ Pericardium: There was no pericardial effusion.  ------------------------------------------------------------ Systemic veins: Inferior vena cava: The vessel was normal in size.  ASSESSMENT AND PLAN: 63 year old woman with underlying nonischemic cardiomyopathy and New York Heart Association functional class III symptoms of congestive heart failure. The patient has an underlying left bundle branch block and LVEF is less than 35% by both echocardiography and cardiac MRI. She has frequent lightheadedness and I'm not sure that she is  going to tolerate much more aggressive titration of her medical therapy. She is on a good dose of metoprolol succinate and valsartan. I will try to add Aldactone in the future. I am going to refer her for a formal electrophysiology evaluation for consideration of CRT-D. in the setting of her cardiomyopathy, left bundle branch block, and class III symptoms. I will see her back in 4 months for follow-up evaluation.  Sherren Mocha 11/11/2013 10:15 AM

## 2013-11-14 ENCOUNTER — Ambulatory Visit (INDEPENDENT_AMBULATORY_CARE_PROVIDER_SITE_OTHER): Payer: 59 | Admitting: Internal Medicine

## 2013-11-14 ENCOUNTER — Encounter: Payer: Self-pay | Admitting: *Deleted

## 2013-11-14 ENCOUNTER — Telehealth: Payer: Self-pay | Admitting: Internal Medicine

## 2013-11-14 ENCOUNTER — Encounter: Payer: Self-pay | Admitting: Internal Medicine

## 2013-11-14 VITALS — BP 168/69 | HR 74 | Ht 62.5 in | Wt 136.0 lb

## 2013-11-14 DIAGNOSIS — R000 Tachycardia, unspecified: Secondary | ICD-10-CM

## 2013-11-14 DIAGNOSIS — I5022 Chronic systolic (congestive) heart failure: Secondary | ICD-10-CM

## 2013-11-14 DIAGNOSIS — I498 Other specified cardiac arrhythmias: Secondary | ICD-10-CM

## 2013-11-14 DIAGNOSIS — I519 Heart disease, unspecified: Secondary | ICD-10-CM

## 2013-11-14 LAB — CBC WITH DIFFERENTIAL/PLATELET
Basophils Absolute: 0 10*3/uL (ref 0.0–0.1)
Basophils Relative: 0.5 % (ref 0.0–3.0)
Eosinophils Absolute: 0.3 10*3/uL (ref 0.0–0.7)
Eosinophils Relative: 4.7 % (ref 0.0–5.0)
HCT: 43.1 % (ref 36.0–46.0)
Hemoglobin: 14.4 g/dL (ref 12.0–15.0)
Lymphocytes Relative: 21.8 % (ref 12.0–46.0)
Lymphs Abs: 1.6 10*3/uL (ref 0.7–4.0)
MCHC: 33.5 g/dL (ref 30.0–36.0)
MCV: 87.9 fl (ref 78.0–100.0)
Monocytes Absolute: 0.6 10*3/uL (ref 0.1–1.0)
Monocytes Relative: 7.9 % (ref 3.0–12.0)
Neutro Abs: 4.7 10*3/uL (ref 1.4–7.7)
Neutrophils Relative %: 65.1 % (ref 43.0–77.0)
Platelets: 166 10*3/uL (ref 150.0–400.0)
RBC: 4.9 Mil/uL (ref 3.87–5.11)
RDW: 12.8 % (ref 11.5–14.6)
WBC: 7.2 10*3/uL (ref 4.5–10.5)

## 2013-11-14 LAB — BASIC METABOLIC PANEL
BUN: 15 mg/dL (ref 6–23)
CO2: 31 mEq/L (ref 19–32)
Calcium: 9.9 mg/dL (ref 8.4–10.5)
Chloride: 102 mEq/L (ref 96–112)
Creatinine, Ser: 0.8 mg/dL (ref 0.4–1.2)
GFR: 77.69 mL/min (ref >60.00)
Glucose, Bld: 117 mg/dL — ABNORMAL HIGH (ref 70–99)
Potassium: 3.5 mEq/L (ref 3.5–5.1)
Sodium: 141 mEq/L (ref 135–145)

## 2013-11-14 NOTE — Telephone Encounter (Signed)
New message    Need to change date of procedure.  Looking for  4/30.

## 2013-11-14 NOTE — Telephone Encounter (Signed)
Will leave as is

## 2013-11-14 NOTE — Patient Instructions (Addendum)
Your physician has recommended that you have a defibrillator inserted. An implantable cardioverter defibrillator (ICD) is a small device that is placed in your chest or, in rare cases, your abdomen. This device uses electrical pulses or shocks to help control life-threatening, irregular heartbeats that could lead the heart to suddenly stop beating (sudden cardiac arrest). Leads are attached to the ICD that goes into your heart. This is done in the hospital and usually requires an overnight stay. Please see the instruction sheet given to you today for more information.   Dates for procedure:4/27,4/30,5/6,5/8,5/12,5,15,5/20,5/21,5/27,5/29  Call Leonia Reader if and when your want to proceed  Your physician recommends that you schedule a follow-up appointment for wound check in device clinic after 12/05/13

## 2013-11-14 NOTE — Telephone Encounter (Signed)
New Prob   Pt states she no longer wants to change her procedure date and to leave it as is. (4/27).

## 2013-11-14 NOTE — Assessment & Plan Note (Signed)
The patient has chronic systolic heart failure with an EF of 30% (over 6 months) despite maximal medical therapy with a beta blocker and ARB and diuretic. I have discussed the indications,risks/benefits/goals/expectations of ICD implant and she wishes to proceed.

## 2013-11-14 NOTE — Progress Notes (Signed)
HPI [name] is referred today by Dr. Burt Knack for evaluation of and consideration for insertion of an ICD. She is a very pleasant 63 yo woman with a h/o chronic systolic heart failure, non-ischemic CM, and palpitations. She has been on maximal medical therapy since October 2014 and repeat echo done a few weeks ago demonstrated an EF of 30%. She has never had syncope. She been maintained on metoprolol and valsartan and lasix. No edema. No chest pressure though she does have palpitations. Allergies  Allergen Reactions  . Aspirin Other (See Comments) and Swelling    Causes nose bleeds Causes nose bleeds  . Penicillins Shortness Of Breath and Rash    Shortness of Breath - Throat felt like it was closing.   [name] [Conj Estrog-Medroxyprogest Ace] Shortness Of Breath    Throat swelling  . Ace Inhibitors Cough  . Simvastatin Other (See Comments) and Rash    Muscle aches     Current Outpatient Prescriptions  Medication Sig Dispense Refill  . albuterol (PROVENTIL HFA;VENTOLIN HFA) 108 (90 BASE) MCG/ACT inhaler Inhale 2 puffs into the lungs every 6 (six) hours as needed for wheezing.  1 Inhaler  1  . albuterol (PROVENTIL) (2.5 MG/3ML) 0.083% nebulizer solution Take 2.5 mg by nebulization daily as needed for wheezing or shortness of breath.      . beclomethasone (QVAR) 80 MCG/ACT inhaler Inhale 1 puff into the lungs daily as needed (shortness of breath).       . carbidopa-levodopa (SINEMET) 25-100 MG per tablet Take 1 tablet by mouth 3 (three) times daily. For parkinsons      . carbidopa-levodopa (SINEMET) 25-100 MG per tablet Take 1.5 tablets by mouth.      . cyclobenzaprine (FLEXERIL) 5 MG tablet Take 1 tablet (5 mg total) by mouth 3 (three) times daily as needed for muscle spasms.  30 tablet  1  . EPINEPHrine (EPIPEN) 0.3 mg/0.3 mL SOAJ injection Inject 0.3 mg into the muscle as needed (allergic reaction).      . furosemide (LASIX) 40 MG tablet Take 1 tablet (40 mg total) by mouth  daily.  90 tablet  3  . gabapentin (NEURONTIN) 100 MG capsule Take 1-2 capsules (100-200 mg total) by mouth at bedtime as needed (for muscle pain).  60 capsule  3  . HYDROcodone-acetaminophen (NORCO/VICODIN) 5-325 MG per tablet Take by mouth.      . meloxicam (MOBIC) 7.5 MG tablet Take 7.5 mg by mouth daily as needed for pain.      . metFORMIN (GLUCOPHAGE) 500 MG tablet Take 2 tablets (1,000 mg total) by mouth 2 (two) times daily with a meal.  180 tablet  4  . metoprolol succinate (TOPROL-XL) 50 MG 24 hr tablet Take 1 tablet (50 mg total) by mouth daily.  30 tablet  6  . nitroGLYCERIN (NITROSTAT) 0.4 MG SL tablet Place 1 tablet (0.4 mg total) under the tongue every 5 (five) minutes as needed for chest pain. Take one tablet and place under tongue if develop chest pain may repeat x2  25 tablet  2  . potassium chloride SA (K-DUR,KLOR-CON) 20 MEQ tablet Take 1 tablet (20 mEq total) by mouth daily.  90 tablet  3  . PRESCRIPTION MEDICATION Take 1 application by mouth daily as needed (for rosacea). Cream from medication assistance program      . ranitidine (ZANTAC) 150 MG tablet Take 1 tablet (150 mg total) by mouth 2 (two) times daily.  60 tablet  4  .  sertraline (ZOLOFT) 100 MG tablet Take 1 tablet (100 mg total) by mouth at bedtime.  30 tablet  6  . valsartan (DIOVAN) 160 MG tablet Take 1 tablet (160 mg total) by mouth daily.  30 tablet  11   No current facility-administered medications for this visit.     Past Medical History  Diagnosis Date  . Sleep apnea     No CPAP  . Gastritis   . Plantar fasciitis   . Diabetes mellitus   . Hyperlipidemia   . Asthma   . Urticaria   . Irregular heart beat   . Parkinson disease   . CHF (congestive heart failure)     ROS:   All systems reviewed and negative except as noted in the HPI.   Past Surgical History  Procedure Laterality Date  . Tubal ligation    . Cholecystectomy    . Bunionectomy       Family History  Problem Relation Age of  Onset  . Coronary artery disease Father     Died age 80  . Coronary artery disease Mother     Died age 21     History   Social History  . Marital Status: Married    Spouse Name: N/A    Number of Children: 2  . Years of Education: N/A   Occupational History  .     Social History Main Topics  . Smoking status: Passive Smoke Exposure - Never Smoker  . Smokeless tobacco: Not on file     Comment: exposed to smoke during child hood (parents)  . Alcohol Use: No  . Drug Use: No  . Sexual Activity: Not on file   Other Topics Concern  . Not on file   Social History Narrative   Lives at home with husband and the dog.     BP 168/69  Pulse 74  Ht 5' 2.5" (1.588 m)  Wt 136 lb (61.689 kg)  BMI 24.46 kg/m2  Physical Exam:  Well appearing 63 yo woman, NAD HEENT: Unremarkable Neck:  7 cm JVD, no thyromegally Back:  No CVA tenderness Lungs:  Clear with no wheezes HEART:  Regular rate rhythm, no murmurs, no rubs, no clicks Abd:  soft, positive bowel sounds, no organomegally, no rebound, no guarding Ext:  2 plus pulses, no edema, no cyanosis, no clubbing Skin:  No rashes no nodules Neuro:  CN II through XII intact, motor grossly intact  EKG - NSR with a QRS 80 ms.  DEVICE  Normal device function.  See PaceArt for details.   Assess/Plan:

## 2013-11-15 ENCOUNTER — Encounter (HOSPITAL_COMMUNITY): Payer: Self-pay

## 2013-11-16 ENCOUNTER — Other Ambulatory Visit: Payer: Self-pay | Admitting: Family Medicine

## 2013-11-24 MED ORDER — VANCOMYCIN HCL IN DEXTROSE 1-5 GM/200ML-% IV SOLN
1000.0000 mg | INTRAVENOUS | Status: DC
  Filled 2013-11-24: qty 200

## 2013-11-24 MED ORDER — SODIUM CHLORIDE 0.9 % IR SOLN
80.0000 mg | Status: DC
  Filled 2013-11-24: qty 2

## 2013-11-25 ENCOUNTER — Ambulatory Visit (HOSPITAL_COMMUNITY)
Admission: RE | Admit: 2013-11-25 | Discharge: 2013-11-26 | Disposition: A | Discharge status: Home / Self Care | Payer: 59 | Source: Ambulatory Visit | Attending: Internal Medicine | Admitting: Internal Medicine

## 2013-11-25 ENCOUNTER — Encounter (HOSPITAL_COMMUNITY)
Admission: RE | Disposition: A | Discharge status: Home / Self Care | Payer: Self-pay | Source: Ambulatory Visit | Attending: Internal Medicine

## 2013-11-25 DIAGNOSIS — I5022 Chronic systolic (congestive) heart failure: Secondary | ICD-10-CM | POA: Insufficient documentation

## 2013-11-25 DIAGNOSIS — I428 Other cardiomyopathies: Secondary | ICD-10-CM

## 2013-11-25 DIAGNOSIS — I519 Heart disease, unspecified: Secondary | ICD-10-CM

## 2013-11-25 DIAGNOSIS — J45909 Unspecified asthma, uncomplicated: Secondary | ICD-10-CM | POA: Insufficient documentation

## 2013-11-25 DIAGNOSIS — E119 Type 2 diabetes mellitus without complications: Secondary | ICD-10-CM

## 2013-11-25 DIAGNOSIS — G4730 Sleep apnea, unspecified: Secondary | ICD-10-CM | POA: Insufficient documentation

## 2013-11-25 DIAGNOSIS — I509 Heart failure, unspecified: Secondary | ICD-10-CM | POA: Insufficient documentation

## 2013-11-25 DIAGNOSIS — Z8249 Family history of ischemic heart disease and other diseases of the circulatory system: Secondary | ICD-10-CM | POA: Insufficient documentation

## 2013-11-25 DIAGNOSIS — T59811A Toxic effect of smoke, accidental (unintentional), initial encounter: Secondary | ICD-10-CM | POA: Insufficient documentation

## 2013-11-25 DIAGNOSIS — I5030 Unspecified diastolic (congestive) heart failure: Secondary | ICD-10-CM | POA: Diagnosis present

## 2013-11-25 DIAGNOSIS — T59891A Toxic effect of other specified gases, fumes and vapors, accidental (unintentional), initial encounter: Secondary | ICD-10-CM | POA: Insufficient documentation

## 2013-11-25 DIAGNOSIS — G20 Parkinson's disease: Secondary | ICD-10-CM | POA: Insufficient documentation

## 2013-11-25 DIAGNOSIS — E785 Hyperlipidemia, unspecified: Secondary | ICD-10-CM | POA: Insufficient documentation

## 2013-11-25 DIAGNOSIS — Z79899 Other long term (current) drug therapy: Secondary | ICD-10-CM | POA: Insufficient documentation

## 2013-11-25 DIAGNOSIS — G20A1 Parkinson's disease without dyskinesia, without mention of fluctuations: Secondary | ICD-10-CM | POA: Insufficient documentation

## 2013-11-25 DIAGNOSIS — R000 Tachycardia, unspecified: Secondary | ICD-10-CM

## 2013-11-25 HISTORY — PX: IMPLANTABLE CARDIOVERTER DEFIBRILLATOR IMPLANT: SHX5473

## 2013-11-25 HISTORY — PX: IMPLANTABLE CARDIOVERTER DEFIBRILLATOR IMPLANT: SHX5860

## 2013-11-25 LAB — GLUCOSE, CAPILLARY
Glucose-Capillary: 132 mg/dL — ABNORMAL HIGH (ref 70–99)
Glucose-Capillary: 125 mg/dL — ABNORMAL HIGH (ref 70–99)

## 2013-11-25 LAB — SURGICAL PCR SCREEN
MRSA, PCR: NEGATIVE
Staphylococcus aureus: NEGATIVE

## 2013-11-25 SURGERY — IMPLANTABLE CARDIOVERTER DEFIBRILLATOR IMPLANT
Anesthesia: LOCAL

## 2013-11-25 MED ORDER — CHLORHEXIDINE GLUCONATE 4 % EX LIQD
60.0000 mL | Freq: Once | CUTANEOUS | Status: DC
  Filled 2013-11-25: qty 60

## 2013-11-25 MED ORDER — ALBUTEROL SULFATE (2.5 MG/3ML) 0.083% IN NEBU
2.5000 mg | INHALATION_SOLUTION | Freq: Two times a day (BID) | RESPIRATORY_TRACT | Status: DC
  Administered 2013-11-25 – 2013-11-26 (×2): 2.5 mg via RESPIRATORY_TRACT
  Filled 2013-11-25: qty 3

## 2013-11-25 MED ORDER — FENTANYL CITRATE 0.05 MG/ML IJ SOLN
INTRAMUSCULAR | Status: AC
Start: 2013-11-25 — End: 2013-11-25
  Filled 2013-11-25: qty 2

## 2013-11-25 MED ORDER — METOPROLOL SUCCINATE ER 50 MG PO TB24
50.0000 mg | ORAL_TABLET | Freq: Every day | ORAL | Status: DC
  Administered 2013-11-26: 50 mg via ORAL
  Filled 2013-11-25: qty 1

## 2013-11-25 MED ORDER — ACETAMINOPHEN 325 MG PO TABS: 325.0000 mg | ORAL_TABLET | ORAL | Status: DC | PRN

## 2013-11-25 MED ORDER — ALBUTEROL SULFATE (2.5 MG/3ML) 0.083% IN NEBU
2.5000 mg | INHALATION_SOLUTION | Freq: Four times a day (QID) | RESPIRATORY_TRACT | Status: DC | PRN
Start: 2013-11-25 — End: 2013-11-26
  Filled 2013-11-25: qty 3

## 2013-11-25 MED ORDER — HYDROCODONE-ACETAMINOPHEN 5-325 MG PO TABS
1.0000 | ORAL_TABLET | Freq: Four times a day (QID) | ORAL | Status: DC | PRN
  Administered 2013-11-25 – 2013-11-26 (×2): 1 via ORAL
  Filled 2013-11-25: qty 1
  Filled 2013-11-25: qty 2

## 2013-11-25 MED ORDER — ONDANSETRON HCL 4 MG/2ML IJ SOLN: 4.0000 mg | Freq: Four times a day (QID) | INTRAMUSCULAR | Status: DC | PRN

## 2013-11-25 MED ORDER — IRBESARTAN 75 MG PO TABS
75.0000 mg | ORAL_TABLET | Freq: Every day | ORAL | Status: DC
  Administered 2013-11-25 – 2013-11-26 (×2): 75 mg via ORAL
  Filled 2013-11-25 (×3): qty 1

## 2013-11-25 MED ORDER — CYCLOBENZAPRINE HCL 10 MG PO TABS: 5.0000 mg | ORAL_TABLET | Freq: Three times a day (TID) | ORAL | Status: DC | PRN

## 2013-11-25 MED ORDER — LIDOCAINE HCL (PF) 1 % IJ SOLN
INTRAMUSCULAR | Status: AC
  Filled 2013-11-25: qty 30

## 2013-11-25 MED ORDER — HEPARIN (PORCINE) IN NACL 2-0.9 UNIT/ML-% IJ SOLN
INTRAMUSCULAR | Status: AC
  Filled 2013-11-25: qty 500

## 2013-11-25 MED ORDER — SODIUM CHLORIDE 0.9 % IV SOLN
INTRAVENOUS | Status: DC
  Administered 2013-11-25: 09:00:00 via INTRAVENOUS

## 2013-11-25 MED ORDER — MUPIROCIN 2 % EX OINT
TOPICAL_OINTMENT | Freq: Two times a day (BID) | CUTANEOUS | Status: DC
  Administered 2013-11-25: 1 via NASAL
  Filled 2013-11-25: qty 22

## 2013-11-25 MED ORDER — SERTRALINE HCL 100 MG PO TABS
100.0000 mg | ORAL_TABLET | Freq: Every day | ORAL | Status: DC
  Administered 2013-11-25: 100 mg via ORAL
  Filled 2013-11-25 (×2): qty 1

## 2013-11-25 MED ORDER — MUPIROCIN 2 % EX OINT
TOPICAL_OINTMENT | CUTANEOUS | Status: AC
  Filled 2013-11-25: qty 22

## 2013-11-25 MED ORDER — ALBUTEROL SULFATE HFA 108 (90 BASE) MCG/ACT IN AERS: 2.0000 | INHALATION_SPRAY | Freq: Four times a day (QID) | RESPIRATORY_TRACT | Status: DC | PRN

## 2013-11-25 MED ORDER — EPINEPHRINE 0.3 MG/0.3ML IJ SOAJ: 0.3000 mg | INTRAMUSCULAR | Status: DC | PRN

## 2013-11-25 MED ORDER — CARBIDOPA-LEVODOPA 25-100 MG PO TABS
1.0000 | ORAL_TABLET | Freq: Three times a day (TID) | ORAL | Status: DC
  Administered 2013-11-25 – 2013-11-26 (×3): 1 via ORAL
  Filled 2013-11-25 (×9): qty 1

## 2013-11-25 MED ORDER — POTASSIUM CHLORIDE CRYS ER 20 MEQ PO TBCR
20.0000 meq | EXTENDED_RELEASE_TABLET | Freq: Every day | ORAL | Status: DC
  Administered 2013-11-25 – 2013-11-26 (×2): 20 meq via ORAL
  Filled 2013-11-25 (×2): qty 1

## 2013-11-25 MED ORDER — METFORMIN HCL 500 MG PO TABS
500.0000 mg | ORAL_TABLET | Freq: Every day | ORAL | Status: DC
  Filled 2013-11-25 (×2): qty 1

## 2013-11-25 MED ORDER — MIDAZOLAM HCL 5 MG/5ML IJ SOLN
INTRAMUSCULAR | Status: AC
  Filled 2013-11-25: qty 5

## 2013-11-25 MED ORDER — VANCOMYCIN HCL IN DEXTROSE 1-5 GM/200ML-% IV SOLN
1000.0000 mg | Freq: Two times a day (BID) | INTRAVENOUS | Status: AC
  Administered 2013-11-25: 1000 mg via INTRAVENOUS
  Filled 2013-11-25: qty 200

## 2013-11-25 MED ORDER — FUROSEMIDE 40 MG PO TABS
40.0000 mg | ORAL_TABLET | Freq: Every day | ORAL | Status: DC
  Administered 2013-11-25 – 2013-11-26 (×2): 40 mg via ORAL
  Filled 2013-11-25 (×2): qty 1

## 2013-11-25 MED ORDER — FLUTICASONE PROPIONATE HFA 44 MCG/ACT IN AERO
1.0000 | INHALATION_SPRAY | Freq: Two times a day (BID) | RESPIRATORY_TRACT | Status: DC
  Filled 2013-11-25 (×2): qty 10.6

## 2013-11-25 MED ORDER — GABAPENTIN 100 MG PO CAPS
100.0000 mg | ORAL_CAPSULE | Freq: Every evening | ORAL | Status: DC | PRN
  Filled 2013-11-25: qty 2

## 2013-11-25 MED ORDER — NITROGLYCERIN 0.4 MG SL SUBL: 0.4000 mg | SUBLINGUAL_TABLET | SUBLINGUAL | Status: DC | PRN

## 2013-11-25 NOTE — CV Procedure (Signed)
SURGEON:  Cristopher Peru, MD      PREPROCEDURE DIAGNOSES:   1. Non-Ischemic cardiomyopathy.   2. New York Heart Association class II, heart failure chronically.      POSTPROCEDURE DIAGNOSES:   1. Non-Ischemic cardiomyopathy.   2. New York Heart Association class II heart failure chronically.      PROCEDURES:    1. ICD implantation.  2. Defibrillation threshold testing     INTRODUCTION:  Veronica Shaw is a 63 y.o. female with an ischemic CM (EF 30%), NYHA Class II CHF, and CAD. At this time, she meets MADIT II/ SCD-HeFT criteria for ICD implantation for primary prevention of sudden death.  The patient has a narrow QRS at baseline with intermittant LBBB and does not meet criteria for revascularization.  The patient has been treated with an optimal medical regimen but continues to have a depressed ejection fraction and NYHA Class II CHF symptoms.  The patient therefore  presents today for ICD implantation.      DESCRIPTION OF PROCEDURE:  Informed written consent was obtained and the patient was brought to the electrophysiology lab in the fasting state. The patient was adequately sedated with intravenous Versed, and fentanyl as outlined in the nursing report.  The patient's left chest was prepped and draped in the usual sterile fashion by the EP lab staff.  The skin overlying the left deltopectoral region was infiltrated with lidocaine for local analgesia.  A 5-cm incision was made over the left deltopectoral region.  A left subcutaneous defibrillator pocket was fashioned using a combination of sharp and blunt dissection.  Electrocautery was used to assure hemostasis.   Left Upper extremity Venography:  A venogram of the left upper extremity was performed which revealed a moderate sized left axillary vein which emptied into a moderate sized left subclavian vein.    RA/RV Lead Placement: The left axillary vein was cannulated with fluoroscopic visualization.  No contrast was required for this  endeavor.  Through the left axillary vein, a Medtronic model [id number]  (serial # [id number]  ) right atrial lead and a Medtronic model [id number] (serial number [id number]) right ventricular defibrillator lead were advanced with fluoroscopic visualization into the right atrial appendage and right ventricular apical septal positions respectively.  Initial atrial lead P-waves measured 1.8 mV with an impedance of 532 ohms and a threshold of 0.5 volts at 0.5 milliseconds.  The right ventricular lead R-wave measured 15 mV with impedance of 867 ohms and a threshold of 1.0 volts at 0.5 milliseconds.   The leads were secured to the pectoralis  fascia using #2 silk suture over the suture sleeves.  The pocket then  irrigated with copious gentamicin solution.  The leads were then  connected to a Medtronic (serial  Number [id number]) ICD.  The defibrillator was placed into the  pocket.  The pocket was then closed in 2 layers with 2.0 Vicryl suture  for the subcutaneous and subcuticular layers.  Steri-Strips and a  sterile dressing were then applied.   DFT Testing: Defibrillation Threshold testing was then performed. Ventricular fibrillation was induced with a T shock.  Adequate sensing of ventricular fibrillation was observed with minimal dropout with a programmed sensitivity of 1.2 mV.  The patient was successfully defibrillated to sinus rhythm with a single 20 joules shock delivered from the device with an impedance of 72 ohms in a duration of 10 seconds.  The patient remained in sinus rhythm thereafter.  There were no early apparent complications.     CONCLUSIONS:  1. Non-Ischemic cardiomyopathy with chronic New York Heart Association class II heart failure.   2. Successful ICD implantation.   3. DFT less than or equal to 20 joules.   4. No early apparent complications.   Cristopher Peru, MD  11:57 AM 11/25/2013

## 2013-11-25 NOTE — Discharge Instructions (Signed)
° °  Supplemental Discharge Instructions for  °Pacemaker/Defibrillator Patients ° °Activity °No heavy lifting or vigorous activity with your left/right arm for 6 to 8 weeks.  Do not raise your left/right arm above your head for one week.  Gradually raise your affected arm as drawn below. ° °        ° 05/01                      05/02                       05/03                      05/04 °     ° °NO DRIVING for 1 week; you may begin driving on 12/02/2013. °WOUND CARE °  Keep the wound area clean and dry.  Do not get this area wet for one week. No showers for one week; you may shower on 12/02/2013. °  The tape/steri-strips on your wound will fall off; do not pull them off.  No bandage is needed on the site.  DO  NOT apply any creams, oils, or ointments to the wound area. °  If you notice any drainage or discharge from the wound, any swelling or bruising at the site, or you develop a fever > 101? F after you are discharged home, call the office at once. ° °Special Instructions °  You are still able to use cellular telephones; use the ear opposite the side where you have your pacemaker/defibrillator.  Avoid carrying your cellular phone near your device. °  When traveling through airports, show security personnel your identification card to avoid being screened in the metal detectors.  Ask the security personnel to use the hand wand. °  Avoid arc welding equipment, MRI testing (magnetic resonance imaging), TENS units (transcutaneous nerve stimulators).  Call the office for questions about other devices. °  Avoid electrical appliances that are in poor condition or are not properly grounded. °  Microwave ovens are safe to be near or to operate. ° °Additional information for defibrillator patients should your device go off: °  If your device goes off ONCE and you feel fine afterward, notify the device clinic nurses. °  If your device goes off ONCE and you do not feel well afterward, call 911. °  If your device goes off  TWICE, call 911. °  If your device goes off THREE times in one day, call 911. ° °DO NOT DRIVE YOURSELF OR A FAMILY MEMBER °WITH A DEFIBRILLATOR TO THE HOSPITAL--CALL 911. ° ° °

## 2013-11-25 NOTE — H&P (View-Only) (Signed)
HPI [name] is referred today by Dr. Burt Shaw for evaluation of and consideration for insertion of an ICD. She is a very pleasant 63 yo woman with a h/o chronic systolic heart failure, non-ischemic CM, and palpitations. She has been on maximal medical therapy since October 2014 and repeat echo done a few weeks ago demonstrated an EF of 30%. She has never had syncope. She been maintained on metoprolol and valsartan and lasix. No edema. No chest pressure though she does have palpitations. Allergies  Allergen Reactions  . Aspirin Other (See Comments) and Swelling    Causes nose bleeds Causes nose bleeds  . Penicillins Shortness Of Breath and Rash    Shortness of Breath - Throat felt like it was closing.   Veronica Shaw [Conj Estrog-Medroxyprogest Ace] Shortness Of Breath    Throat swelling  . Ace Inhibitors Cough  . Simvastatin Other (See Comments) and Rash    Muscle aches     Current Outpatient Prescriptions  Medication Sig Dispense Refill  . albuterol (PROVENTIL HFA;VENTOLIN HFA) 108 (90 BASE) MCG/ACT inhaler Inhale 2 puffs into the lungs every 6 (six) hours as needed for wheezing.  1 Inhaler  1  . albuterol (PROVENTIL) (2.5 MG/3ML) 0.083% nebulizer solution Take 2.5 mg by nebulization daily as needed for wheezing or shortness of breath.      . beclomethasone (QVAR) 80 MCG/ACT inhaler Inhale 1 puff into the lungs daily as needed (shortness of breath).       . carbidopa-levodopa (SINEMET) 25-100 MG per tablet Take 1 tablet by mouth 3 (three) times daily. For parkinsons      . carbidopa-levodopa (SINEMET) 25-100 MG per tablet Take 1.5 tablets by mouth.      . cyclobenzaprine (FLEXERIL) 5 MG tablet Take 1 tablet (5 mg total) by mouth 3 (three) times daily as needed for muscle spasms.  30 tablet  1  . EPINEPHrine (EPIPEN) 0.3 mg/0.3 mL SOAJ injection Inject 0.3 mg into the muscle as needed (allergic reaction).      . furosemide (LASIX) 40 MG tablet Take 1 tablet (40 mg total) by mouth  daily.  90 tablet  3  . gabapentin (NEURONTIN) 100 MG capsule Take 1-2 capsules (100-200 mg total) by mouth at bedtime as needed (for muscle pain).  60 capsule  3  . HYDROcodone-acetaminophen (NORCO/VICODIN) 5-325 MG per tablet Take by mouth.      . meloxicam (MOBIC) 7.5 MG tablet Take 7.5 mg by mouth daily as needed for pain.      . metFORMIN (GLUCOPHAGE) 500 MG tablet Take 2 tablets (1,000 mg total) by mouth 2 (two) times daily with a meal.  180 tablet  4  . metoprolol succinate (TOPROL-XL) 50 MG 24 hr tablet Take 1 tablet (50 mg total) by mouth daily.  30 tablet  6  . nitroGLYCERIN (NITROSTAT) 0.4 MG SL tablet Place 1 tablet (0.4 mg total) under the tongue every 5 (five) minutes as needed for chest pain. Take one tablet and place under tongue if develop chest pain may repeat x2  25 tablet  2  . potassium chloride SA (K-DUR,KLOR-CON) 20 MEQ tablet Take 1 tablet (20 mEq total) by mouth daily.  90 tablet  3  . PRESCRIPTION MEDICATION Take 1 application by mouth daily as needed (for rosacea). Cream from medication assistance program      . ranitidine (ZANTAC) 150 MG tablet Take 1 tablet (150 mg total) by mouth 2 (two) times daily.  60 tablet  4  .  sertraline (ZOLOFT) 100 MG tablet Take 1 tablet (100 mg total) by mouth at bedtime.  30 tablet  6  . valsartan (DIOVAN) 160 MG tablet Take 1 tablet (160 mg total) by mouth daily.  30 tablet  11   No current facility-administered medications for this visit.     Past Medical History  Diagnosis Date  . Sleep apnea     No CPAP  . Gastritis   . Plantar fasciitis   . Diabetes mellitus   . Hyperlipidemia   . Asthma   . Urticaria   . Irregular heart beat   . Parkinson disease   . CHF (congestive heart failure)     ROS:   All systems reviewed and negative except as noted in the HPI.   Past Surgical History  Procedure Laterality Date  . Tubal ligation    . Cholecystectomy    . Bunionectomy       Family History  Problem Relation Age of  Onset  . Coronary artery disease Father     Died age 80  . Coronary artery disease Mother     Died age 21     History   Social History  . Marital Status: Married    Spouse Name: N/A    Number of Children: 2  . Years of Education: N/A   Occupational History  .     Social History Main Topics  . Smoking status: Passive Smoke Exposure - Never Smoker  . Smokeless tobacco: Not on file     Comment: exposed to smoke during child hood (parents)  . Alcohol Use: No  . Drug Use: No  . Sexual Activity: Not on file   Other Topics Concern  . Not on file   Social History Narrative   Lives at home with husband and the dog.     BP 168/69  Pulse 74  Ht 5' 2.5" (1.588 m)  Wt 136 lb (61.689 kg)  BMI 24.46 kg/m2  Physical Exam:  Well appearing 63 yo woman, NAD HEENT: Unremarkable Neck:  7 cm JVD, no thyromegally Back:  No CVA tenderness Lungs:  Clear with no wheezes HEART:  Regular rate rhythm, no murmurs, no rubs, no clicks Abd:  soft, positive bowel sounds, no organomegally, no rebound, no guarding Ext:  2 plus pulses, no edema, no cyanosis, no clubbing Skin:  No rashes no nodules Neuro:  CN II through XII intact, motor grossly intact  EKG - NSR with a QRS 80 ms.  DEVICE  Normal device function.  See PaceArt for details.   Assess/Plan:

## 2013-11-25 NOTE — Progress Notes (Signed)
Patient had a fall at 1830. Patient stated that she fell to her knees while ambulating to the bathroom.  Patient's vital signs stable.  Lorretta Harp PA notified that patient fell.  Small amount of blood noted on dressing to left chest wall.  Katie notified.

## 2013-11-25 NOTE — H&P (Signed)
  ICD Criteria  Current LVEF:30% ;Obtained > 6 months ago.   NYHA Functional Classification: Class II  Heart Failure History:  Yes, Duration of heart failure since onset is 3 to 9 months  Non-Ischemic Dilated Cardiomyopathy History:  Yes, timeframe is 3 to 9 months  Atrial Fibrillation/Atrial Flutter:  No.  Ventricular Tachycardia History:  No.  Cardiac Arrest History:  No  History of Syndromes with Risk of Sudden Death:  No.  Previous ICD:  No.  Electrophysiology Study: No.  Prior MI: No.  PPM: No.  OSA:  No  Patient Life Expectancy of >=1 year: Yes.  Anticoagulation Therapy:  Patient is NOT on anticoagulation therapy.   Beta Blocker Therapy:  Yes.   Ace Inhibitor/ARB Therapy:  Yes.

## 2013-11-25 NOTE — Discharge Summary (Signed)
ELECTROPHYSIOLOGY PROCEDURE DISCHARGE SUMMARY    Patient ID: Alek Poncedeleon,  MRN: [id number], DOB/AGE: Jan 19, 1951 63 y.o.  Admit date: 11/25/2013 Discharge date: 11/26/2013  Primary Care Physician: Liam Graham, MD Primary Cardiologist: Burt Knack Electrophysiologist: Lovena Le  Primary Discharge Diagnosis:  Non ischemic cardiomyopathy with chronic class 2 systolic heart failure, status post ICD implant this admission  Secondary Discharge Diagnosis:  1. Class II congestive heart failure 2.  Diabetes 3.  Hyperlipidemia 4.  [place] 5.  Sleep apnea  Allergies  Allergen Reactions  . Aspirin Other (See Comments) and Swelling    Causes nose bleeds Causes nose bleeds  . Penicillins Shortness Of Breath and Rash    Shortness of Breath - Throat felt like it was closing.   [name] [Conj Estrog-Medroxyprogest Ace] Shortness Of Breath    Throat swelling  . Ace Inhibitors Cough  . Simvastatin Other (See Comments) and Rash    Muscle aches     Procedures This Admission:  1.  Implantation of a Medtronic dual chamber ICD on 11-25-13 by [name].  The patient received a MDT ICD with model number 5076 right atrial lead, 6935 right ventricular lead.  DFT's were successful at 73 J.  There were no immediate post procedure complications. 2.  CXR on 11-26-13 demonstrated no pneumothorax status post device implantation.   Brief HPI: Tashyra Adduci is a 62 y.o. female was referred to electrophysiology in the outpatient setting for consideration of ICD implantation.  Past medical history includes non ischemic cardiomyopathy and congestive heart failure.  The patient has persistent LV dysfunction despite guideline directed therapy.  Risks, benefits, and alternatives to ICD implantation were reviewed with the patient who wished to proceed.   Hospital Course:  The patient was admitted and underwent implantation of a Medtronic ICD with details as outlined above.   female was monitored on  telemetry overnight which demonstrated sinus rhythm.  Left chest was without hematoma or ecchymosis.  The device was interrogated and found to be functioning normally.  CXR was obtained and demonstrated no pneumothorax status post device implantation.  Wound care, arm mobility, and restrictions were reviewed with the patient.  [name] examined the patient and considered them stable for discharge to home.   The patient's discharge medications include an ACE-I/ARB (Valsartan) and beta blocker (Metoprolol).   Discharge Vitals: Blood pressure 144/52, pulse 81, temperature 99 F (37.2 C), temperature source Oral, resp. rate 19, height 5\' 2"  (1.575 m), weight 132 lb 7.9 oz (60.1 kg), SpO2 95.00%.    Labs:   Lab Results  Component Value Date   WBC 7.2 11/14/2013   HGB 14.4 11/14/2013   HCT 43.1 11/14/2013   MCV 87.9 11/14/2013   PLT 166.0 11/14/2013   No results found for this basename: NA, K, CL, CO2, BUN, CREATININE, CALCIUM, LABALBU, PROT, BILITOT, ALKPHOS, ALT, AST, GLUCOSE,  in the last 168 hours   Discharge Medications:    Medication List    ASK your doctor about these medications       albuterol (2.5 MG/3ML) 0.083% nebulizer solution  Commonly known as:  PROVENTIL  Take 2.5 mg by nebulization 2 (two) times daily.     albuterol 108 (90 BASE) MCG/ACT inhaler  Commonly known as:  PROVENTIL HFA;VENTOLIN HFA  Inhale 2 puffs into the lungs every 6 (six) hours as needed for wheezing.     beclomethasone 80 MCG/ACT inhaler  Commonly known as:  QVAR  Inhale 1 puff into the lungs daily as needed (shortness  of breath).     cyclobenzaprine 5 MG tablet  Commonly known as:  FLEXERIL  Take 1 tablet (5 mg total) by mouth 3 (three) times daily as needed for muscle spasms.     EPIPEN 0.3 mg/0.3 mL Soaj injection  Generic drug:  EPINEPHrine  Inject 0.3 mg into the muscle as needed (allergic reaction).     furosemide 40 MG tablet  Commonly known as:  LASIX  Take 1 tablet (40 mg total) by mouth  daily.     gabapentin 100 MG capsule  Commonly known as:  NEURONTIN  Take 1-2 capsules (100-200 mg total) by mouth at bedtime as needed (for muscle pain).     HYDROcodone-acetaminophen 5-325 MG per tablet  Commonly known as:  NORCO/VICODIN  Take 1 tablet by mouth every 6 (six) hours as needed for moderate pain.     meloxicam 7.5 MG tablet  Commonly known as:  MOBIC  Take 7.5 mg by mouth daily as needed for pain.     metFORMIN 500 MG tablet  Commonly known as:  GLUCOPHAGE  TAKE TWO TABLETS BY MOUTH TWICE DAILY WITH  A  MEAL     metoprolol succinate 50 MG 24 hr tablet  Commonly known as:  TOPROL-XL  Take 1 tablet (50 mg total) by mouth daily.     metroNIDAZOLE 1 % gel  Commonly known as:  METROGEL  Apply 1 application topically daily. Apply to rosacea     nitroGLYCERIN 0.4 MG SL tablet  Commonly known as:  NITROSTAT  Place 1 tablet (0.4 mg total) under the tongue every 5 (five) minutes as needed for chest pain. Take one tablet and place under tongue if develop chest pain may repeat x2     potassium chloride SA 20 MEQ tablet  Commonly known as:  K-DUR,KLOR-CON  Take 1 tablet (20 mEq total) by mouth daily.     PRESCRIPTION MEDICATION  Take 1 application by mouth daily as needed (for rosacea). Cream from medication assistance program     ranitidine 150 MG tablet  Commonly known as:  ZANTAC  Take 150 mg by mouth daily.     sertraline 100 MG tablet  Commonly known as:  ZOLOFT  Take 1 tablet (100 mg total) by mouth at bedtime.     SINEMET 25-100 MG per tablet  Generic drug:  carbidopa-levodopa  Take 1 tablet by mouth 3 (three) times daily. For parkinsons     valsartan 160 MG tablet  Commonly known as:  DIOVAN  Take 1 tablet (160 mg total) by mouth daily.        Disposition:   Future Appointments Provider Department Dept Phone   12/09/2013 11:30 AM [facility] [place] [facility] [phone]   03/06/2014 10:45 AM [name], MD [place] [facility] [phone]     Follow-up Information   Follow up with [facility] On 12/09/2013. (At 11:30 AM for wound check)    Specialty:  Cardiology   Contact information:   [address] [place] [address] [phone]      Follow up with Cristopher Peru, MD On 03/06/2014. (At 10:45 AM)    Specialty:  Cardiology   Contact information:   [address] [address] [phone]       Duration of Discharge Encounter: less than 30 minutes including physician time.  Signed,  Mikle Bosworth.D.

## 2013-11-25 NOTE — Progress Notes (Signed)
Report given to receiving RN. Patient in bed resting with no verbal complaint and no signs or symptoms of distress or discomfort. Family at bedside.

## 2013-11-25 NOTE — Interval H&P Note (Signed)
History and Physical Interval Note:  11/25/2013 10:01 AM  Veronica Shaw  has presented today for surgery, with the diagnosis of chf/LV disfunction  The various methods of treatment have been discussed with the patient and family. After consideration of risks, benefits and other options for treatment, the patient has consented to  Procedure(s): IMPLANTABLE CARDIOVERTER DEFIBRILLATOR IMPLANT (N/A) as a surgical intervention .  The patient's history has been reviewed, patient examined, no change in status, stable for surgery.  I have reviewed the patient's chart and labs.  Questions were answered to the patient's satisfaction.     Veronica Shaw.D.

## 2013-11-26 ENCOUNTER — Ambulatory Visit (HOSPITAL_COMMUNITY): Payer: 59

## 2013-11-26 ENCOUNTER — Telehealth: Payer: Self-pay | Admitting: Internal Medicine

## 2013-11-26 ENCOUNTER — Encounter (HOSPITAL_COMMUNITY): Payer: Self-pay | Admitting: *Deleted

## 2013-11-26 DIAGNOSIS — I5022 Chronic systolic (congestive) heart failure: Secondary | ICD-10-CM

## 2013-11-26 LAB — GLUCOSE, CAPILLARY
Glucose-Capillary: 144 mg/dL — ABNORMAL HIGH (ref 70–99)
Glucose-Capillary: 147 mg/dL — ABNORMAL HIGH (ref 70–99)

## 2013-11-26 MED ORDER — METFORMIN HCL 500 MG PO TABS
500.0000 mg | ORAL_TABLET | Freq: Every day | ORAL | Status: DC
  Administered 2013-11-26: 500 mg via ORAL
  Filled 2013-11-26 (×2): qty 1

## 2013-11-26 NOTE — Telephone Encounter (Signed)
New message     Pt left hospital today, she had a pace/defibulator put in and they gave her some ointment when she left the hosp. She want to know if she uses the ointment after the stitches are dissolved or use it now

## 2013-11-26 NOTE — Progress Notes (Signed)
DC IV, DC Tele, DC Home. Discharge instructions and home medications discussed with patient and patient's and patient's husband. Patient and husband denied any questions or concerns at this time. Patient leaving unit via wheelchair and appears in no acute distress.

## 2013-11-26 NOTE — Telephone Encounter (Signed)
Spoke with patient and the ointment is for rosacea   No one explained that to her.  If she feels the device site is not going down ans she is still in a lot of pain to call nback and let me know

## 2013-12-09 ENCOUNTER — Encounter: Payer: Self-pay | Admitting: Internal Medicine

## 2013-12-09 ENCOUNTER — Ambulatory Visit (INDEPENDENT_AMBULATORY_CARE_PROVIDER_SITE_OTHER): Payer: 59 | Admitting: *Deleted

## 2013-12-09 DIAGNOSIS — I498 Other specified cardiac arrhythmias: Secondary | ICD-10-CM

## 2013-12-09 DIAGNOSIS — I5022 Chronic systolic (congestive) heart failure: Secondary | ICD-10-CM

## 2013-12-09 DIAGNOSIS — R000 Tachycardia, unspecified: Secondary | ICD-10-CM

## 2013-12-09 LAB — MDC_IDC_ENUM_SESS_TYPE_INCLINIC
Battery Remaining Longevity: 134 mo
Battery Voltage: 3.15 V
Brady Statistic AP VP Percent: 0 %
Brady Statistic AP VS Percent: 0.3 %
Brady Statistic AS VP Percent: 0.03 %
Brady Statistic AS VS Percent: 99.66 %
Brady Statistic RA Percent Paced: 0.3 %
Brady Statistic RV Percent Paced: 0.04 %
Date Time Interrogation Session: 20150511115320
HighPow Impedance: 190 Ohm
HighPow Impedance: 65 Ohm
Lead Channel Impedance Value: 4047 Ohm
Lead Channel Impedance Value: 4047 Ohm
Lead Channel Impedance Value: 4047 Ohm
Lead Channel Impedance Value: 513 Ohm
Lead Channel Impedance Value: 608 Ohm
Lead Channel Pacing Threshold Amplitude: 0.5 V
Lead Channel Pacing Threshold Amplitude: 0.75 V
Lead Channel Pacing Threshold Pulse Width: 0.4 ms
Lead Channel Pacing Threshold Pulse Width: 0.4 ms
Lead Channel Sensing Intrinsic Amplitude: 16.375 mV
Lead Channel Sensing Intrinsic Amplitude: 19.25 mV
Lead Channel Sensing Intrinsic Amplitude: 2.125 mV
Lead Channel Sensing Intrinsic Amplitude: 2.375 mV
Lead Channel Setting Pacing Amplitude: 3.5 V
Lead Channel Setting Pacing Amplitude: 3.5 V
Lead Channel Setting Pacing Pulse Width: 0.4 ms
Lead Channel Setting Sensing Sensitivity: 0.3 mV
Zone Setting Detection Interval: 300 ms
Zone Setting Detection Interval: 350 ms
Zone Setting Detection Interval: 360 ms
Zone Setting Detection Interval: 360 ms

## 2013-12-09 NOTE — Progress Notes (Signed)
Wound check appointment. Steri-strips removed. Wound without redness or edema. Incision edges approximated, wound well healed. Normal device function. Thresholds, sensing, and impedances consistent with implant measurements. Device programmed at 3.5V for extra safety margin until 3 month visit. Histogram distribution appropriate for patient and level of activity. No mode switches or ventricular arrhythmias noted. Patient educated about wound care, arm mobility, lifting restrictions, shock plan. ROV in 3 months with GT. 

## 2013-12-19 ENCOUNTER — Other Ambulatory Visit: Payer: Self-pay | Admitting: Family Medicine

## 2014-01-03 ENCOUNTER — Ambulatory Visit: Payer: 59 | Admitting: Cardiovascular Disease

## 2014-01-18 ENCOUNTER — Emergency Department (INDEPENDENT_AMBULATORY_CARE_PROVIDER_SITE_OTHER): Payer: 59

## 2014-01-18 ENCOUNTER — Emergency Department (INDEPENDENT_AMBULATORY_CARE_PROVIDER_SITE_OTHER)
Admission: EM | Admit: 2014-01-18 | Discharge: 2014-01-18 | Disposition: A | Discharge status: Home / Self Care | Payer: No Typology Code available for payment source | Source: Home / Self Care | Attending: Family Medicine | Admitting: Family Medicine

## 2014-01-18 ENCOUNTER — Encounter (HOSPITAL_COMMUNITY): Payer: Self-pay | Admitting: Emergency Medicine

## 2014-01-18 DIAGNOSIS — J45901 Unspecified asthma with (acute) exacerbation: Secondary | ICD-10-CM

## 2014-01-18 DIAGNOSIS — J4521 Mild intermittent asthma with (acute) exacerbation: Secondary | ICD-10-CM

## 2014-01-18 MED ORDER — METHYLPREDNISOLONE SODIUM SUCC 125 MG IJ SOLR
125.0000 mg | Freq: Once | INTRAMUSCULAR | Status: AC
  Administered 2014-01-18: 125 mg via INTRAMUSCULAR

## 2014-01-18 MED ORDER — METHYLPREDNISOLONE SODIUM SUCC 125 MG IJ SOLR
INTRAMUSCULAR | Status: AC
  Filled 2014-01-18: qty 2

## 2014-01-18 MED ORDER — ALBUTEROL SULFATE (2.5 MG/3ML) 0.083% IN NEBU
5.0000 mg | INHALATION_SOLUTION | Freq: Once | RESPIRATORY_TRACT | Status: AC
  Administered 2014-01-18: 5 mg via RESPIRATORY_TRACT

## 2014-01-18 MED ORDER — IPRATROPIUM BROMIDE 0.02 % IN SOLN
RESPIRATORY_TRACT | Status: AC
  Filled 2014-01-18: qty 2.5

## 2014-01-18 MED ORDER — IPRATROPIUM BROMIDE 0.06 % NA SOLN: 2.0000 | Freq: Four times a day (QID) | NASAL | Status: DC

## 2014-01-18 MED ORDER — IPRATROPIUM BROMIDE 0.02 % IN SOLN
0.5000 mg | Freq: Once | RESPIRATORY_TRACT | Status: AC
  Administered 2014-01-18: 0.5 mg via RESPIRATORY_TRACT

## 2014-01-18 MED ORDER — ALBUTEROL SULFATE (2.5 MG/3ML) 0.083% IN NEBU
INHALATION_SOLUTION | RESPIRATORY_TRACT | Status: AC
  Filled 2014-01-18: qty 3

## 2014-01-18 MED ORDER — HYDROCOD POLST-CHLORPHEN POLST 10-8 MG/5ML PO LQCR: 5.0000 mL | Freq: Two times a day (BID) | ORAL | Status: DC | PRN

## 2014-01-18 NOTE — Discharge Instructions (Signed)
Drink plenty of fluids as discussed, use medicine as prescribed, and mucinex for cough. Return or see your doctor if further problems °

## 2014-01-18 NOTE — ED Provider Notes (Signed)
CSN: [id number]     Arrival date & time 01/18/14  0907 History   First MD Initiated Contact with Patient 01/18/14 0920     Chief Complaint  Patient presents with  . Asthma   (Consider location/radiation/quality/duration/timing/severity/associated sxs/prior Treatment) Patient is a 63 y.o. female presenting with asthma. The history is provided by the patient.  Asthma This is a new problem. The current episode started more than 1 week ago. The problem has been gradually worsening. Associated symptoms include shortness of breath.    Past Medical History  Diagnosis Date  . Sleep apnea     No CPAP  . Gastritis   . Plantar fasciitis   . Diabetes mellitus   . Hyperlipidemia   . Asthma   . Urticaria   . Parkinson disease   . CHF (congestive heart failure)    Past Surgical History  Procedure Laterality Date  . Tubal ligation    . Cholecystectomy    . Bunionectomy    . Implantable cardioverter defibrillator implant  11-25-13    MDT dual chamber ICD implanted by [name] for primary prevention   Family History  Problem Relation Age of Onset  . Coronary artery disease Father     Died age 45  . Coronary artery disease Mother     Died age 23   History  Substance Use Topics  . Smoking status: Passive Smoke Exposure - Never Smoker  . Smokeless tobacco: Not on file     Comment: exposed to smoke during child hood (parents)  . Alcohol Use: No   OB History   Grav Para Term Preterm Abortions TAB SAB Ect Mult Living                 Review of Systems  Constitutional: Negative.   HENT: Positive for congestion, postnasal drip, rhinorrhea and sore throat.   Respiratory: Positive for cough, shortness of breath and wheezing.   Cardiovascular: Negative for palpitations and leg swelling.  Gastrointestinal: Negative.     Allergies  Aspirin; Penicillins; Prempro; Ace inhibitors; and Simvastatin  Home Medications   Prior to Admission medications   Medication Sig Start Date End Date  Taking? Authorizing Provider  albuterol (PROVENTIL HFA;VENTOLIN HFA) 108 (90 BASE) MCG/ACT inhaler Inhale 2 puffs into the lungs every 6 (six) hours as needed for wheezing. 10/18/13  Yes Kandis Nab, MD  albuterol (PROVENTIL) (2.5 MG/3ML) 0.083% nebulizer solution Take 2.5 mg by nebulization 2 (two) times daily.   Yes Historical Provider, MD  beclomethasone (QVAR) 80 MCG/ACT inhaler Inhale 1 puff into the lungs daily as needed (shortness of breath).    Yes Historical Provider, MD  furosemide (LASIX) 40 MG tablet Take 1 tablet (40 mg total) by mouth daily. 10/10/13  Yes Sherren Mocha, MD  gabapentin (NEURONTIN) 100 MG capsule Take 1-2 capsules (100-200 mg total) by mouth at bedtime as needed (for muscle pain). 10/18/13  Yes Kandis Nab, MD  meloxicam (MOBIC) 7.5 MG tablet Take 7.5 mg by mouth daily as needed for pain.   Yes Historical Provider, MD  metFORMIN (GLUCOPHAGE) 500 MG tablet TAKE TWO TABLETS BY MOUTH TWICE DAILY WITH A MEAL 12/19/13  Yes Kandis Nab, MD  sertraline (ZOLOFT) 100 MG tablet Take 1 tablet (100 mg total) by mouth at bedtime. 10/02/13  Yes Kandis Nab, MD  valsartan (DIOVAN) 160 MG tablet Take 1 tablet (160 mg total) by mouth daily. 01/25/12  Yes Judithann Sheen, MD  carbidopa-levodopa (SINEMET) 25-100 MG per tablet  Take 1 tablet by mouth 3 (three) times daily. For parkinsons 05/01/13 05/01/14  Historical Provider, MD  chlorpheniramine-HYDROcodone (TUSSIONEX PENNKINETIC ER) 10-8 MG/5ML LQCR Take 5 mLs by mouth every 12 (twelve) hours as needed for cough. 01/18/14   Billy Fischer, MD  cyclobenzaprine (FLEXERIL) 5 MG tablet Take 1 tablet (5 mg total) by mouth 3 (three) times daily as needed for muscle spasms. 10/02/13   Kandis Nab, MD  EPINEPHrine (EPIPEN) 0.3 mg/0.3 mL SOAJ injection Inject 0.3 mg into the muscle as needed (allergic reaction).    Historical Provider, MD  HYDROcodone-acetaminophen (NORCO/VICODIN) 5-325 MG per tablet Take 1 tablet by mouth every 6 (six)  hours as needed for moderate pain.  06/17/13   Historical Provider, MD  ipratropium (ATROVENT) 0.06 % nasal spray Place 2 sprays into both nostrils 4 (four) times daily. 01/18/14   Billy Fischer, MD  metoprolol succinate (TOPROL-XL) 50 MG 24 hr tablet Take 1 tablet (50 mg total) by mouth daily. 09/20/13   Sherren Mocha, MD  metroNIDAZOLE (METROGEL) 1 % gel Apply 1 application topically daily. Apply to rosacea    Historical Provider, MD  nitroGLYCERIN (NITROSTAT) 0.4 MG SL tablet Place 1 tablet (0.4 mg total) under the tongue every 5 (five) minutes as needed for chest pain. Take one tablet and place under tongue if develop chest pain may repeat x2 09/18/12   Minus Breeding, MD  potassium chloride SA (K-DUR,KLOR-CON) 20 MEQ tablet Take 1 tablet (20 mEq total) by mouth daily. 10/10/13   Sherren Mocha, MD  PRESCRIPTION MEDICATION Take 1 application by mouth daily as needed (for rosacea). Cream from medication assistance program    Historical Provider, MD  ranitidine (ZANTAC) 150 MG tablet Take 150 mg by mouth daily.    Historical Provider, MD   BP 148/76  Pulse 92  Temp(Src) 98.2 F (36.8 C) (Oral)  Resp 20  SpO2 97% Physical Exam  Nursing note and vitals reviewed. Constitutional: She is oriented to person, place, and time. She appears well-developed and well-nourished. She appears distressed.  HENT:  Right Ear: External ear normal.  Left Ear: External ear normal.  Mouth/Throat: Oropharynx is clear and moist.  Neck: Normal range of motion. Neck supple.  Pulmonary/Chest: She has wheezes. She exhibits tenderness.  Lymphadenopathy:    She has no cervical adenopathy.  Neurological: She is alert and oriented to person, place, and time.  Skin: Skin is warm and dry.    ED Course  Procedures (including critical care time) Labs Review Labs Reviewed - No data to display  Imaging Review Dg Chest 2 View  01/18/2014   CLINICAL DATA:  Cough and shortness of breath  EXAM: CHEST  2 VIEW  COMPARISON:   11/26/2013  FINDINGS: Stable orientation of dual-chamber left approach ICD/pacer leads. Heart size is normal. There is calcified left hilar lymphadenopathy. Parenchymal calcified granuloma noted at the peripheral left base.  No consolidation, edema, effusion, or pneumothorax. Bulky thoracic endplate spurring.  IMPRESSION: No active cardiopulmonary disease.   Electronically Signed   By: Jorje Guild M.D.   On: 01/18/2014 10:05   X-rays reviewed and report per radiologist.   MDM   1. Asthma exacerbation attacks, mild intermittent    Sx much improved after neb at time of d/c.    Billy Fischer, MD 01/18/14 1106

## 2014-01-18 NOTE — ED Notes (Signed)
Pt c/o asthma flare up onset 3 days Sx include: congestion, wheezing, SOB, productive cough, CP due to cough Denies f/v/n/d Hx of CHF; had defibrillator placed 2 months ago Alert and talking in complete sentences w/no signs of acute distress.

## 2014-01-23 ENCOUNTER — Telehealth: Payer: Self-pay | Admitting: Family Medicine

## 2014-01-23 MED ORDER — BENZONATATE 100 MG PO CAPS: 100.0000 mg | ORAL_CAPSULE | Freq: Two times a day (BID) | ORAL | Status: DC | PRN

## 2014-01-23 NOTE — Telephone Encounter (Signed)
Please let patient know that I am sending tessalon perles to the pharmacy.   Thank you!  Liam Graham, PGY-3 Family Medicine Resident

## 2014-01-23 NOTE — Telephone Encounter (Signed)
Patient informed. 

## 2014-01-23 NOTE — Telephone Encounter (Signed)
Patient seen at [facility] this past weekend with asthma/cough. She was given chlorpheniramine-HYDROcodone for cough but states that this makes she very drowsy. She is requesting a refill on Tessalon because she states that it worked great for her 2 years ago and does not cause her drowsiness. SHe states she had 2 pills left for her last RX and it has already help with cough dramatically. Please advise.

## 2014-02-17 ENCOUNTER — Ambulatory Visit: Payer: 59 | Admitting: Family Medicine

## 2014-02-24 ENCOUNTER — Other Ambulatory Visit: Payer: Self-pay | Admitting: *Deleted

## 2014-02-24 MED ORDER — ALBUTEROL SULFATE (2.5 MG/3ML) 0.083% IN NEBU: 2.5000 mg | INHALATION_SOLUTION | Freq: Two times a day (BID) | RESPIRATORY_TRACT | Status: DC

## 2014-02-26 ENCOUNTER — Encounter: Payer: Self-pay | Admitting: Family Medicine

## 2014-02-26 ENCOUNTER — Ambulatory Visit (INDEPENDENT_AMBULATORY_CARE_PROVIDER_SITE_OTHER): Payer: Medicare Other | Admitting: Family Medicine

## 2014-02-26 VITALS — BP 143/69 | HR 81 | Temp 98.5°F | Ht 62.0 in | Wt 136.7 lb

## 2014-02-26 DIAGNOSIS — R0989 Other specified symptoms and signs involving the circulatory and respiratory systems: Secondary | ICD-10-CM

## 2014-02-26 DIAGNOSIS — I5022 Chronic systolic (congestive) heart failure: Secondary | ICD-10-CM

## 2014-02-26 DIAGNOSIS — R0689 Other abnormalities of breathing: Secondary | ICD-10-CM

## 2014-02-26 DIAGNOSIS — R0609 Other forms of dyspnea: Secondary | ICD-10-CM

## 2014-02-26 DIAGNOSIS — E119 Type 2 diabetes mellitus without complications: Secondary | ICD-10-CM

## 2014-02-26 DIAGNOSIS — J410 Simple chronic bronchitis: Secondary | ICD-10-CM

## 2014-02-26 DIAGNOSIS — R0600 Dyspnea, unspecified: Secondary | ICD-10-CM

## 2014-02-26 LAB — POCT GLYCOSYLATED HEMOGLOBIN (HGB A1C): Hemoglobin A1C: 6.6

## 2014-02-26 MED ORDER — FUROSEMIDE 40 MG PO TABS: 40.0000 mg | ORAL_TABLET | Freq: Two times a day (BID) | ORAL | Status: DC

## 2014-02-26 MED ORDER — BECLOMETHASONE DIPROPIONATE 80 MCG/ACT IN AERS: 2.0000 | INHALATION_SPRAY | Freq: Two times a day (BID) | RESPIRATORY_TRACT | Status: DC

## 2014-02-26 NOTE — Assessment & Plan Note (Signed)
Well controlled, A1c 6.6 - continue metformin 1000 bid

## 2014-02-26 NOTE — Patient Instructions (Signed)
For your shortness of breath, please start using your Qvar, twice every day, and increase your lasix to 40mg  twice a day. Please also call your cardiologist to make an appointment with them.  Please come back to see me or one of my colleagues in 1 week. If you have worsening shortness of breath or chest pain before then, please come in to be seen or go to the emergency room.  Thank you.

## 2014-02-26 NOTE — Assessment & Plan Note (Signed)
Likely more cardiac cause of this episode, some increased sputum and wheezing points to possible COPD component as well - increase qvar to bid - increase lasix to bid - make appointment with cardiologist - rtc in 1 week or sooner if you're not getting better

## 2014-02-26 NOTE — Progress Notes (Signed)
   Subjective:    Patient ID: Veronica Shaw, female    DOB: 20-Jul-1951, 63 y.o.   MRN: [id number]  Shortness of Breath Pertinent negatives include no chest pain, fever or leg swelling.   Pt presents with progressive dyspnea for the past 2-3 weeks. No cough, fever. Some increased sputum production. Slightly improved with albuterol. Weight gain of 4 pounds. 5 pillow orthopnea and PND.    Review of Systems  Constitutional: Negative for fever and chills.  HENT: Negative for congestion.   Respiratory: Positive for shortness of breath. Negative for cough.   Cardiovascular: Negative for chest pain, palpitations and leg swelling.  Neurological: Negative for dizziness and light-headedness.  All other systems reviewed and are negative.      Objective:   Physical Exam  Nursing note and vitals reviewed. Constitutional: She is oriented to person, place, and time. She appears well-developed and well-nourished. No distress.  HENT:  Head: Normocephalic and atraumatic.  Eyes: Conjunctivae are normal. Right eye exhibits no discharge. Left eye exhibits no discharge. No scleral icterus.  Cardiovascular: Normal rate, regular rhythm, normal heart sounds and intact distal pulses.   No murmur heard. +JVD  Pulmonary/Chest: Effort normal. No respiratory distress. She has wheezes.  Occasional expiratory wheezes, no crackles. Increased WOB after minimal exertion. Normal O2 sat  Abdominal: Soft. She exhibits no distension. There is no tenderness.  Musculoskeletal: She exhibits no edema.  Neurological: She is alert and oriented to person, place, and time.  Skin: Skin is warm and dry. She is not diaphoretic.  Psychiatric: She has a normal mood and affect. Her behavior is normal.          Assessment & Plan:

## 2014-02-27 ENCOUNTER — Telehealth: Payer: Self-pay | Admitting: Family Medicine

## 2014-02-27 ENCOUNTER — Telehealth: Payer: Self-pay | Admitting: Cardiovascular Disease

## 2014-02-27 DIAGNOSIS — J449 Chronic obstructive pulmonary disease, unspecified: Secondary | ICD-10-CM

## 2014-02-27 DIAGNOSIS — I5022 Chronic systolic (congestive) heart failure: Secondary | ICD-10-CM

## 2014-02-27 NOTE — Telephone Encounter (Signed)
Pt called and said that the doctor needs to write another prescription with a diagnose code on it before the can give her the inhaler. Please fax this to [phone]. jw

## 2014-02-27 NOTE — Telephone Encounter (Signed)
Called stating she was seen by her PCP at [facility] practice yesterday for SOB and weight gain.  States they increased her Lasix to 40 mg BID and was told to make an appointment with her cardiologist within 5 days.  States she is using her Albuterol 3-4 times a day.  States she is still having some SOB. States she has gained about 3 lbs over 24 hrs.  Spoke w/Dr. Burt Knack who offered to see her this afternoon but pt states she can not come due to personal appointment.  She is scheduled to see the defib clinic next Thurs 8/6 and Dr. Burt Knack 8/21.  Advised her that no one else had an available appointment within the next couple of days. Advised her to call her PCP if not any better by first of week.  She understands and agreed to plan.

## 2014-02-27 NOTE — Telephone Encounter (Signed)
New problem ° ° ° °Pt having SOB.. °

## 2014-02-28 MED ORDER — BECLOMETHASONE DIPROPIONATE 80 MCG/ACT IN AERS: 2.0000 | INHALATION_SPRAY | Freq: Two times a day (BID) | RESPIRATORY_TRACT | Status: DC

## 2014-02-28 NOTE — Telephone Encounter (Signed)
Script resent with dx, please inform patient

## 2014-03-05 ENCOUNTER — Telehealth: Payer: Self-pay | Admitting: Family Medicine

## 2014-03-05 NOTE — Telephone Encounter (Signed)
Pt called and the nebulizer we prescribed does not have the diagnose code on it so they will not fill it. [facility] needs [place] to fax over a new prescription for her nebulizer with the diagnosis code on it. jw

## 2014-03-06 ENCOUNTER — Other Ambulatory Visit: Payer: Self-pay | Admitting: Family Medicine

## 2014-03-06 ENCOUNTER — Encounter: Payer: Self-pay | Admitting: Internal Medicine

## 2014-03-06 ENCOUNTER — Ambulatory Visit (INDEPENDENT_AMBULATORY_CARE_PROVIDER_SITE_OTHER): Payer: Medicare Other | Admitting: Internal Medicine

## 2014-03-06 VITALS — BP 160/83 | HR 89 | Ht 62.0 in | Wt 134.0 lb

## 2014-03-06 DIAGNOSIS — J45909 Unspecified asthma, uncomplicated: Secondary | ICD-10-CM

## 2014-03-06 DIAGNOSIS — R0602 Shortness of breath: Secondary | ICD-10-CM | POA: Diagnosis not present

## 2014-03-06 DIAGNOSIS — I5022 Chronic systolic (congestive) heart failure: Secondary | ICD-10-CM

## 2014-03-06 DIAGNOSIS — Z95810 Presence of automatic (implantable) cardiac defibrillator: Secondary | ICD-10-CM | POA: Insufficient documentation

## 2014-03-06 LAB — MDC_IDC_ENUM_SESS_TYPE_INCLINIC
Battery Remaining Longevity: 132 mo
Battery Voltage: 3.1 V
Brady Statistic AP VP Percent: 0 %
Brady Statistic AP VS Percent: 0.32 %
Brady Statistic AS VP Percent: 0.05 %
Brady Statistic AS VS Percent: 99.62 %
Brady Statistic RA Percent Paced: 0.32 %
Brady Statistic RV Percent Paced: 0.06 %
Date Time Interrogation Session: 20150806143113
HighPow Impedance: 228 Ohm
HighPow Impedance: 75 Ohm
Lead Channel Impedance Value: 4047 Ohm
Lead Channel Impedance Value: 4047 Ohm
Lead Channel Impedance Value: 4047 Ohm
Lead Channel Impedance Value: 456 Ohm
Lead Channel Impedance Value: 817 Ohm
Lead Channel Pacing Threshold Amplitude: 0.5 V
Lead Channel Pacing Threshold Amplitude: 0.875 V
Lead Channel Pacing Threshold Pulse Width: 0.4 ms
Lead Channel Pacing Threshold Pulse Width: 0.4 ms
Lead Channel Sensing Intrinsic Amplitude: 19.375 mV
Lead Channel Sensing Intrinsic Amplitude: 19.75 mV
Lead Channel Sensing Intrinsic Amplitude: 2.375 mV
Lead Channel Sensing Intrinsic Amplitude: 2.5 mV
Lead Channel Setting Pacing Amplitude: 2 V
Lead Channel Setting Pacing Amplitude: 2.5 V
Lead Channel Setting Pacing Pulse Width: 0.4 ms
Lead Channel Setting Sensing Sensitivity: 0.3 mV
Zone Setting Detection Interval: 300 ms
Zone Setting Detection Interval: 350 ms
Zone Setting Detection Interval: 360 ms
Zone Setting Detection Interval: 360 ms

## 2014-03-06 MED ORDER — ALBUTEROL SULFATE (2.5 MG/3ML) 0.083% IN NEBU: 2.5000 mg | INHALATION_SOLUTION | Freq: Two times a day (BID) | RESPIRATORY_TRACT | Status: DC

## 2014-03-06 NOTE — Assessment & Plan Note (Signed)
Her biV device with LV port plugged is working normally. Will recheck in several months.

## 2014-03-06 NOTE — Assessment & Plan Note (Signed)
Her symptoms are class 2B. She is on guideline directed medical therapy. Her QRS is narrow today. While she does have a BiV ICD, we did not place an LV lead, waiting for her LBBB to progress. It has not. The patient is very sedentary and has a Programme researcher, broadcasting/film/video. I encouraged her to start exercising. Her fluid index, exam suggest that she is euvolemic.

## 2014-03-06 NOTE — Patient Instructions (Signed)
Remote monitoring is used to monitor your ICD from home. This monitoring reduces the number of office visits required to check your device to one time per year. It allows [place] to keep an eye on the functioning of your device to ensure it is working properly. You are scheduled for a device check from home on 06-09-2014. You may send your transmission at any time that day. If you have a wireless device, the transmission will be sent automatically. After your physician reviews your transmission, you will receive a postcard with your next transmission date.  Your physician recommends that you schedule a follow-up appointment in: April 2016 with Dr.Taylor

## 2014-03-06 NOTE — Progress Notes (Signed)
HPI Veronica Shaw returns today for followup. She is a pleasant 63 yo woman with a h/o an ICM, chronic systolic heart failure, and COPD/Asthma. She has had intermittant LBBB. Whenever I have seen her she had a narrow QRS as she does today. She notes periods where she is short of breath. She has had no syncope or ICD shock. She admits to a very sedentary life style. 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 No edema. Allergies  Allergen Reactions  . Aspirin Swelling and Other (See Comments)    Causes nose bleeds *ONLY THE COATED ASA*  . Penicillins Shortness Of Breath and Rash    Shortness of Breath - Throat felt like it was closing.   [name] [Conj Estrog-Medroxyprogest Ace] Shortness Of Breath    Throat swelling  . Ace Inhibitors Cough  . Simvastatin Other (See Comments) and Rash    Muscle aches     Current Outpatient Prescriptions  Medication Sig Dispense Refill  . albuterol (PROVENTIL HFA;VENTOLIN HFA) 108 (90 BASE) MCG/ACT inhaler Inhale 2 puffs into the lungs every 6 (six) hours as needed for wheezing.  1 Inhaler  1  . albuterol (PROVENTIL) (2.5 MG/3ML) 0.083% nebulizer solution Take 3 mLs (2.5 mg total) by nebulization 2 (two) times daily. Dx 493.9  75 mL  2  . beclomethasone (QVAR) 80 MCG/ACT inhaler Inhale 2 puffs into the lungs 2 (two) times daily. Dx 493.20  1 Inhaler  12  . carbidopa-levodopa (SINEMET) 25-100 MG per tablet Take 1 tablet by mouth 3 (three) times daily. For parkinsons      . cyclobenzaprine (FLEXERIL) 5 MG tablet Take 1 tablet (5 mg total) by mouth 3 (three) times daily as needed for muscle spasms.  30 tablet  1  . EPINEPHrine (EPIPEN) 0.3 mg/0.3 mL SOAJ injection Inject 0.3 mg into the muscle as needed (allergic reaction).      . furosemide (LASIX) 40 MG tablet Take 1 tablet (40 mg total) by mouth 2 (two) times daily.  180 tablet  3  . gabapentin (NEURONTIN) 100 MG capsule Take 1-2 capsules (100-200 mg total) by mouth at bedtime as needed (for  muscle pain).  60 capsule  3  . HYDROcodone-acetaminophen (NORCO/VICODIN) 5-325 MG per tablet Take 1 tablet by mouth every 6 (six) hours as needed for moderate pain.       . meloxicam (MOBIC) 7.5 MG tablet Take 7.5 mg by mouth daily as needed for pain.      . metFORMIN (GLUCOPHAGE) 500 MG tablet TAKE TWO TABLETS BY MOUTH TWICE DAILY WITH A MEAL  180 tablet  0  . metoprolol succinate (TOPROL-XL) 50 MG 24 hr tablet Take 1 tablet (50 mg total) by mouth daily.  30 tablet  6  . metroNIDAZOLE (METROGEL) 1 % gel Apply 1 application topically daily. Apply to rosacea      . nitroGLYCERIN (NITROSTAT) 0.4 MG SL tablet Place 1 tablet (0.4 mg total) under the tongue every 5 (five) minutes as needed for chest pain. Take one tablet and place under tongue if develop chest pain may repeat x2  25 tablet  2  . potassium chloride SA (K-DUR,KLOR-CON) 20 MEQ tablet Take 1 tablet (20 mEq total) by mouth daily.  90 tablet  3  . PRESCRIPTION MEDICATION Take 1 application by mouth daily as needed (for rosacea). Cream from medication assistance program      . ranitidine (ZANTAC) 150 MG tablet Take 150 mg by mouth daily.      [name] Kitchen  sertraline (ZOLOFT) 100 MG tablet Take 1 tablet (100 mg total) by mouth at bedtime.  30 tablet  6  . valsartan (DIOVAN) 160 MG tablet Take 1 tablet (160 mg total) by mouth daily.  30 tablet  11   No current facility-administered medications for this visit.     Past Medical History  Diagnosis Date  . Sleep apnea     No CPAP  . Gastritis   . Plantar fasciitis   . Diabetes mellitus   . Hyperlipidemia   . Asthma   . Urticaria   . Parkinson disease   . CHF (congestive heart failure)     ROS:   All systems reviewed and negative except as noted in the HPI.   Past Surgical History  Procedure Laterality Date  . Tubal ligation    . Cholecystectomy    . Bunionectomy    . Implantable cardioverter defibrillator implant  11-25-13    MDT dual chamber ICD implanted by [name] for primary  prevention     Family History  Problem Relation Age of Onset  . Coronary artery disease Father     Died age 46  . Coronary artery disease Mother     Died age 54     History   Social History  . Marital Status: Married    Spouse Name: N/A    Number of Children: 2  . Years of Education: N/A   Occupational History  .     Social History Main Topics  . Smoking status: Passive Smoke Exposure - Never Smoker  . Smokeless tobacco: Not on file     Comment: exposed to smoke during child hood (parents)  . Alcohol Use: No  . Drug Use: No  . Sexual Activity: Not on file   Other Topics Concern  . Not on file   Social History Narrative   Lives at home with husband and the dog.     BP 160/83  Pulse 89  Ht 5\' 2"  (1.575 m)  Wt 134 lb (60.782 kg)  BMI 24.50 kg/m2  Physical Exam:  Well appearing 63 yo woman, NAD HEENT: Unremarkable Neck:  6 cm JVD, no thyromegally Back:  No CVA tenderness Lungs:  Clear with no wheezes HEART:  Regular rate rhythm, no murmurs, no rubs, no clicks Abd:  soft, positive bowel sounds, no organomegally, no rebound, no guarding Ext:  2 plus pulses, no edema, no cyanosis, no clubbing Skin:  No rashes no nodules Neuro:  CN II through XII intact, motor grossly intact  EKG - nsr with a narrow QRS  DEVICE  Normal device function.  See PaceArt for details.   Assess/Plan:

## 2014-03-12 ENCOUNTER — Encounter: Payer: Self-pay | Admitting: Family Medicine

## 2014-03-12 DIAGNOSIS — M069 Rheumatoid arthritis, unspecified: Secondary | ICD-10-CM | POA: Insufficient documentation

## 2014-03-20 ENCOUNTER — Other Ambulatory Visit: Payer: Self-pay | Admitting: *Deleted

## 2014-03-20 DIAGNOSIS — I5022 Chronic systolic (congestive) heart failure: Secondary | ICD-10-CM

## 2014-03-21 ENCOUNTER — Encounter: Payer: Self-pay | Admitting: Cardiovascular Disease

## 2014-03-21 ENCOUNTER — Ambulatory Visit (INDEPENDENT_AMBULATORY_CARE_PROVIDER_SITE_OTHER): Payer: Medicare Other | Admitting: Cardiovascular Disease

## 2014-03-21 VITALS — BP 110/56 | HR 81 | Ht 62.0 in | Wt 135.0 lb

## 2014-03-21 DIAGNOSIS — I5022 Chronic systolic (congestive) heart failure: Secondary | ICD-10-CM | POA: Diagnosis not present

## 2014-03-21 MED ORDER — VALSARTAN 80 MG PO TABS: 80.0000 mg | ORAL_TABLET | Freq: Every day | ORAL | Status: DC

## 2014-03-21 NOTE — Patient Instructions (Signed)
Your physician has recommended you make the following change in your medication:  1. DECREASE Diovan to 80mg  take one by mouth daily  Your physician wants you to follow-up in: 6 MONTHS with Dr Burt Knack.  You will receive a reminder letter in the mail two months in advance. If you don't receive a letter, please call our office to schedule the follow-up appointment.

## 2014-03-21 NOTE — Progress Notes (Signed)
HPI:  63 year-old woman presenting for followup evaluation. 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 She was hospitalized in October 2014 with chest pain or shortness of breath. At that time she was diagnosed with a cardiomyopathy with LVEF estimated at 35% as well as a left bundle branch block pattern on EKG. Cardiac catheterization demonstrated patent coronary arteries, normal intracardiac hemodynamics, and severe LV dysfunction. The patient and persistent severe LV dysfunction despite optimal medical therapy. She underwent ICD implantation in April 2015 by Dr. Lovena Le. She has had intermittent left bundle branch block.  Is doing fairly well. She was having more problems with dyspnea, but this is greatly improved since she has gotten back on her inhalers. She does complain of low blood pressure and weakness related to this. Some days she doesn't take her antihypertensive medications when her blood pressure is running low. She's had no recent chest pain, edema, orthopnea, or PND. She denies syncope or palpitations.   Outpatient Encounter Prescriptions as of 03/21/2014  Medication Sig  . albuterol (PROVENTIL HFA;VENTOLIN HFA) 108 (90 BASE) MCG/ACT inhaler Inhale 2 puffs into the lungs every 6 (six) hours as needed for wheezing.  [name] Kitchen albuterol (PROVENTIL) (2.5 MG/3ML) 0.083% nebulizer solution Take 3 mLs (2.5 mg total) by nebulization 2 (two) times daily. Dx 493.9  . beclomethasone (QVAR) 80 MCG/ACT inhaler Inhale 2 puffs into the lungs 2 (two) times daily. Dx 493.20  . carbidopa-levodopa (SINEMET) 25-100 MG per tablet Take 1 tablet by mouth 3 (three) times daily. For parkinsons  . cyclobenzaprine (FLEXERIL) 5 MG tablet Take 1 tablet (5 mg total) by mouth 3 (three) times daily as needed for muscle spasms.  [name] Kitchen EPINEPHrine (EPIPEN) 0.3 mg/0.3 mL SOAJ injection Inject 0.3 mg into the muscle as needed (allergic reaction).  . furosemide (LASIX) 40 MG  tablet Take 1 tablet (40 mg total) by mouth 2 (two) times daily.  [name] Kitchen gabapentin (NEURONTIN) 100 MG capsule Take 1-2 capsules (100-200 mg total) by mouth at bedtime as needed (for muscle pain).  [name] Kitchen HYDROcodone-acetaminophen (NORCO/VICODIN) 5-325 MG per tablet Take 1 tablet by mouth every 6 (six) hours as needed for moderate pain.   . meloxicam (MOBIC) 7.5 MG tablet Take 7.5 mg by mouth daily as needed for pain.  . metFORMIN (GLUCOPHAGE) 500 MG tablet TAKE TWO TABLETS BY MOUTH TWICE DAILY WITH A MEAL  . metoprolol succinate (TOPROL-XL) 50 MG 24 hr tablet Take 1 tablet (50 mg total) by mouth daily.  . metroNIDAZOLE (METROGEL) 1 % gel Apply 1 application topically daily. Apply to rosacea  . nitroGLYCERIN (NITROSTAT) 0.4 MG SL tablet Place 1 tablet (0.4 mg total) under the tongue every 5 (five) minutes as needed for chest pain. Take one tablet and place under tongue if develop chest pain may repeat x2  . potassium chloride SA (K-DUR,KLOR-CON) 20 MEQ tablet Take 1 tablet (20 mEq total) by mouth daily.  [name] Kitchen PRESCRIPTION MEDICATION Take 1 application by mouth daily as needed (for rosacea). Cream from medication assistance program  . ranitidine (ZANTAC) 150 MG tablet Take 150 mg by mouth daily.  . sertraline (ZOLOFT) 100 MG tablet Take 1 tablet (100 mg total) by mouth at bedtime.  . valsartan (DIOVAN) 160 MG tablet Take 1 tablet (160 mg total) by mouth daily.  . [DISCONTINUED] tiotropium (SPIRIVA) 18 MCG inhalation capsule Place 18 mcg into inhaler and inhale.  . [DISCONTINUED] albuterol (PROVENTIL) (2.5 MG/3ML) 0.083% nebulizer solution Take 2.5 mg by nebulization 2 (  two) times daily.  . [DISCONTINUED] beclomethasone (QVAR) 80 MCG/ACT inhaler Inhale 1 puff into the lungs daily as needed (shortness of breath).   . [DISCONTINUED] chlorpheniramine-HYDROcodone (TUSSIONEX PENNKINETIC ER) 10-8 MG/5ML LQCR Take 5 mLs by mouth every 12 (twelve) hours as needed for cough.  . [DISCONTINUED] furosemide (LASIX) 40 MG  tablet Take 1 tablet (40 mg total) by mouth daily.  . [DISCONTINUED] ipratropium (ATROVENT) 0.06 % nasal spray Place 2 sprays into both nostrils 4 (four) times daily.    Allergies  Allergen Reactions  . Aspirin Swelling and Other (See Comments)    Causes nose bleeds *ONLY THE COATED ASA*  . Penicillins Shortness Of Breath and Rash    Shortness of Breath - Throat felt like it was closing.   [name] [Conj Estrog-Medroxyprogest Ace] Shortness Of Breath    Throat swelling  . Ace Inhibitors Cough  . Simvastatin Other (See Comments) and Rash    Muscle aches    Past Medical History  Diagnosis Date  . Sleep apnea     No CPAP  . Gastritis   . Plantar fasciitis   . Diabetes mellitus   . Hyperlipidemia   . Asthma   . Urticaria   . Parkinson disease   . CHF (congestive heart failure)     ROS: Negative except as per HPI  BP 110/56  Pulse 81  Ht 5\' 2"  (1.575 m)  Wt 135 lb (61.236 kg)  BMI 24.69 kg/m2  SpO2 95%  PHYSICAL EXAM: Pt is alert and oriented, NAD HEENT: normal Neck: JVP - normal, carotids 2+= without bruits Lungs: CTA bilaterally Chest: ICD site is well healed CV: RRR without murmur or gallop Abd: soft, NT, Positive BS, no hepatomegaly Ext: no C/C/E, distal pulses intact and equal Skin: warm/dry no rash  ASSESSMENT AND PLAN: 1. Chronic systolic heart failure, [place] Heart Association class II symptoms secondary to underlying nonischemic cardiomyopathy. The patient is on appropriate medical therapy with metoprolol succinate and valsartan. Recommended that she decrease her valsartan dose to 80 mg daily since she is having to hold medications because of symptomatic hypotension. I will see her back in 6 months for followup. She has no congestive symptoms, nor does she have evidence of volume overload on exam.  2. Status post ICD. She has had intermittent left bundle-branch, but primarily is in a narrow complex rhythm. She has a CRT device if needed. Followed by Dr.  Lovena Le.  Sherren Mocha MD 03/21/2014 8:53 AM

## 2014-04-01 MED ORDER — SERTRALINE HCL 100 MG PO TABS: 100.0000 mg | ORAL_TABLET | Freq: Every day | ORAL | Status: DC

## 2014-04-01 MED ORDER — FUROSEMIDE 40 MG PO TABS: 40.0000 mg | ORAL_TABLET | Freq: Two times a day (BID) | ORAL | Status: DC

## 2014-04-01 MED ORDER — METFORMIN HCL 500 MG PO TABS: ORAL_TABLET | ORAL | Status: DC

## 2014-04-01 MED ORDER — METOPROLOL SUCCINATE ER 50 MG PO TB24: 50.0000 mg | ORAL_TABLET | Freq: Every day | ORAL | Status: DC

## 2014-04-04 ENCOUNTER — Encounter: Payer: Self-pay | Admitting: Family Medicine

## 2014-04-10 ENCOUNTER — Other Ambulatory Visit: Payer: Self-pay | Admitting: Family Medicine

## 2014-04-10 ENCOUNTER — Telehealth: Payer: Self-pay | Admitting: Family Medicine

## 2014-04-10 MED ORDER — RANITIDINE HCL 150 MG PO TABS: 150.0000 mg | ORAL_TABLET | Freq: Every day | ORAL | Status: DC

## 2014-04-10 NOTE — Telephone Encounter (Signed)
Refill request for ranitidine. Please send RX to [facility].

## 2014-04-17 ENCOUNTER — Telehealth: Payer: Self-pay | Admitting: Family Medicine

## 2014-04-17 MED ORDER — GABAPENTIN 100 MG PO CAPS: 100.0000 mg | ORAL_CAPSULE | Freq: Every evening | ORAL | Status: DC | PRN

## 2014-04-17 NOTE — Telephone Encounter (Signed)
Patient informed. 

## 2014-04-17 NOTE — Telephone Encounter (Signed)
Pt called and needs a refill on her Gabapentin called in to the [facility]. Please make sure to put on the prescription her ID number [id number] DOB 06/02/1951 and name. jw

## 2014-04-17 NOTE — Telephone Encounter (Signed)
Refill sent to pharmacy. Please inform patient.

## 2014-04-22 ENCOUNTER — Ambulatory Visit
Admission: RE | Admit: 2014-04-22 | Discharge: 2014-04-22 | Disposition: A | Discharge status: Home / Self Care | Payer: Medicare Other | Source: Ambulatory Visit | Attending: Family Medicine | Admitting: Family Medicine

## 2014-04-22 ENCOUNTER — Ambulatory Visit (HOSPITAL_COMMUNITY)
Admission: RE | Admit: 2014-04-22 | Discharge: 2014-04-22 | Disposition: A | Discharge status: Home / Self Care | Payer: Medicare Other | Source: Ambulatory Visit | Attending: Family Medicine | Admitting: Family Medicine

## 2014-04-22 ENCOUNTER — Ambulatory Visit (INDEPENDENT_AMBULATORY_CARE_PROVIDER_SITE_OTHER): Payer: Medicare Other | Admitting: Family Medicine

## 2014-04-22 ENCOUNTER — Encounter: Payer: Self-pay | Admitting: Family Medicine

## 2014-04-22 VITALS — BP 152/61 | HR 94 | Ht 62.0 in | Wt 135.0 lb

## 2014-04-22 DIAGNOSIS — I5023 Acute on chronic systolic (congestive) heart failure: Secondary | ICD-10-CM

## 2014-04-22 DIAGNOSIS — I447 Left bundle-branch block, unspecified: Secondary | ICD-10-CM | POA: Diagnosis not present

## 2014-04-22 DIAGNOSIS — I5022 Chronic systolic (congestive) heart failure: Secondary | ICD-10-CM | POA: Diagnosis not present

## 2014-04-22 DIAGNOSIS — Z8701 Personal history of pneumonia (recurrent): Secondary | ICD-10-CM | POA: Diagnosis not present

## 2014-04-22 DIAGNOSIS — J410 Simple chronic bronchitis: Secondary | ICD-10-CM | POA: Diagnosis not present

## 2014-04-22 DIAGNOSIS — R0602 Shortness of breath: Secondary | ICD-10-CM | POA: Insufficient documentation

## 2014-04-22 DIAGNOSIS — R9431 Abnormal electrocardiogram [ECG] [EKG]: Secondary | ICD-10-CM | POA: Insufficient documentation

## 2014-04-22 DIAGNOSIS — R059 Cough, unspecified: Secondary | ICD-10-CM | POA: Diagnosis not present

## 2014-04-22 DIAGNOSIS — E119 Type 2 diabetes mellitus without complications: Secondary | ICD-10-CM | POA: Diagnosis not present

## 2014-04-22 DIAGNOSIS — R05 Cough: Secondary | ICD-10-CM | POA: Diagnosis not present

## 2014-04-22 DIAGNOSIS — R0609 Other forms of dyspnea: Secondary | ICD-10-CM | POA: Diagnosis not present

## 2014-04-22 DIAGNOSIS — Z95810 Presence of automatic (implantable) cardiac defibrillator: Secondary | ICD-10-CM | POA: Diagnosis not present

## 2014-04-22 NOTE — Patient Instructions (Addendum)
Thank you for coming to see me today. It was a pleasure. Today we talked about:   Shortness of breath: this appears to be related to your heart failure. Please increase your Furosemide (Lasix) by taking 40mg  twice a day for the next three days. If your weight increases 5lbs between now and then, please return immediately for evaluation. I will get an x-ray as well. There appear to be some changes in your EKG. I want you to follow-up with cardiology this week for evaluation  If you have any questions or concerns, please do not hesitate to call the office at [phone].  Sincerely,  Cordelia Poche, MD

## 2014-04-22 NOTE — Progress Notes (Signed)
    Subjective   Veronica Shaw is a 63 y.o. female that presents for a same day visit  1. Shortness of breath: Started one week ago. Worsened two days ago. Has been using albuterol which has not helped. She uses 4-5 pillows at night but still wakes up feeling short of breath at times. Increased dyspnea with exertion. She has been coughing frothy white sputum. She denies any change in her salt intake, stating her husband also has CHF and that they both watch what they eat. They eat mostly home cooked meals that are not frozen and rarely eat out at restaurants. She has had some burn substernal chest pain last occuring last night that was present without exertion and resolved on its own with no radiation. Currently not having chest pain.   History  Substance Use Topics  . Smoking status: Passive Smoke Exposure - Never Smoker  . Smokeless tobacco: Not on file     Comment: exposed to smoke during child hood (parents)  . Alcohol Use: No    ROS Per HPI  Objective   BP 152/61  Pulse 94  Ht 5\' 2"  (1.575 m)  Wt 135 lb (61.236 kg)  BMI 24.69 kg/m2  SpO2 99%  General: Well appearing, in no acute distress Respiratory/Chest: No distress, diffusely decreased most likely due to poor effort with some rales at bilateral bases and no wheezing. Cardiovascular: Regular rate and rhythm without murmur Musculoskeletal: trace edema bilaterally  EKG (changed from last EKG on file) Sinus rhythm with occasional ventricular-paced complexes Left axis deviation Left bundle branch block Abnormal ECG  Assessment and Plan   Please refer to problem based charting of assessment and plan

## 2014-04-23 ENCOUNTER — Encounter: Payer: Self-pay | Admitting: Physician Assistant

## 2014-04-23 ENCOUNTER — Ambulatory Visit (INDEPENDENT_AMBULATORY_CARE_PROVIDER_SITE_OTHER): Payer: Medicare Other | Admitting: Physician Assistant

## 2014-04-23 VITALS — BP 137/70 | HR 91 | Ht 62.0 in | Wt 135.0 lb

## 2014-04-23 DIAGNOSIS — I10 Essential (primary) hypertension: Secondary | ICD-10-CM | POA: Diagnosis not present

## 2014-04-23 DIAGNOSIS — R0602 Shortness of breath: Secondary | ICD-10-CM | POA: Diagnosis not present

## 2014-04-23 DIAGNOSIS — E785 Hyperlipidemia, unspecified: Secondary | ICD-10-CM

## 2014-04-23 DIAGNOSIS — I5023 Acute on chronic systolic (congestive) heart failure: Secondary | ICD-10-CM | POA: Diagnosis not present

## 2014-04-23 DIAGNOSIS — I428 Other cardiomyopathies: Secondary | ICD-10-CM

## 2014-04-23 DIAGNOSIS — Z95810 Presence of automatic (implantable) cardiac defibrillator: Secondary | ICD-10-CM

## 2014-04-23 DIAGNOSIS — G4730 Sleep apnea, unspecified: Secondary | ICD-10-CM

## 2014-04-23 DIAGNOSIS — E118 Type 2 diabetes mellitus with unspecified complications: Secondary | ICD-10-CM

## 2014-04-23 LAB — BRAIN NATRIURETIC PEPTIDE: Pro B Natriuretic peptide (BNP): 80 pg/mL (ref 0.0–100.0)

## 2014-04-23 LAB — CBC
HCT: 42.5 % (ref 36.0–46.0)
Hemoglobin: 14.3 g/dL (ref 12.0–15.0)
MCHC: 33.5 g/dL (ref 30.0–36.0)
MCV: 88.2 fl (ref 78.0–100.0)
Platelets: 204 10*3/uL (ref 150.0–400.0)
RBC: 4.82 Mil/uL (ref 3.87–5.11)
RDW: 12.7 % (ref 11.5–15.5)
WBC: 6.3 10*3/uL (ref 4.0–10.5)

## 2014-04-23 LAB — BASIC METABOLIC PANEL
BUN: 13 mg/dL (ref 6–23)
CO2: 27 mEq/L (ref 19–32)
Calcium: 9.4 mg/dL (ref 8.4–10.5)
Chloride: 103 mEq/L (ref 96–112)
Creatinine, Ser: 0.9 mg/dL (ref 0.4–1.2)
GFR: 65.07 mL/min (ref >60.00)
Glucose, Bld: 138 mg/dL — ABNORMAL HIGH (ref 70–99)
Potassium: 3.8 mEq/L (ref 3.5–5.1)
Sodium: 139 mEq/L (ref 135–145)

## 2014-04-23 MED ORDER — FUROSEMIDE 40 MG PO TABS: 40.0000 mg | ORAL_TABLET | Freq: Every day | ORAL | Status: DC

## 2014-04-23 NOTE — Patient Instructions (Signed)
Your physician recommends that you schedule a follow-up appointment in:  2-3 weeks with Veronica Shaw when Dr. Burt Knack in office.   Your physician has recommended you make the following change in your medication:  Continue Lasix 40 mg by mouth twice daily for 3 days as instructed by primary care. After 3 days return to Lasix 40 mg by mouth daily

## 2014-04-23 NOTE — Progress Notes (Signed)
Cardiology Office Note    Date:  04/23/2014   ID:  Veronica, Shaw [birth year], MRN [id number]  PCP:  Beverlyn Roux, MD  Cardiologist:  Dr. Sherren Mocha   Electrophysiologist:  Dr. Cristopher Peru    History of Present Illness: Veronica Shaw is a 63 y.o. female with a history of nonischemic cardiomyopathy, systolic CHF, s/p AICD 11/4560, diabetes, HL, sleep apnea, asthma.  Last seen by Dr. Burt Knack 03/21/14.    Patient returns for further evaluation of dyspnea. She has fairly significant asthma with baseline dyspnea. She sleeps at an incline chronically. She does admit to eating at [employer] recently. Over the past week, she has noted increased dyspnea with exertion. She describes NYHA 3-4 symptoms. She notes PND as well as increased abdominal girth. She denies LE edema. Her chest has felt tight. She had one sharp pain yesterday. She is unsure if she had an ICD shock or not. She denies syncope. She has a chronic cough with production of foamy sputum. She denies purulent sputum or hemoptysis.  She saw primary care yesterday. Lasix was increased to 40 mg twice daily. She feels better this morning.  Studies:  - LHC (10/14):  Minor luminal irregularity in prox LAD, EF 35%  - Echo (2/15):  EF 30-35%, diff HK, ant-sept AK, Gr 2 DD, mild MR, trivial TR  - Nuclear (5/13):  Normal stress nuclear study. LV Ejection Fraction: 58%  - Cardiac MRI (4/15):  1. Mild LVE, EF 34%, Diff HK, paradoxical septal motion c/w LBBB, small focal area of late gadolinium enhancement at basal anteroseptum - may represent RV volume overload or a focal infiltrative disease such as sarcoidosis, but certainly wouldn't explain the degree of of LV dysfunction. - c/w NICM, normal RV size and function (RVEF 61%), Mild MR and TR   Recent Labs/Images:  05/31/2013: HDL Cholesterol by NMR 49; LDL (calc) 95  10/31/2013: ALT 15; AST 24; Pro B Natriuretic peptide (BNP) 526.1*  11/14/2013: BUN 15; Creatinine 0.8; Hemoglobin 14.4; Potassium 3.5;  Sodium 141; WBC 7.2    Dg Chest 2 View  04/22/2014     IMPRESSION: No active cardiopulmonary disease.   Electronically Signed   By: Lajean Manes M.D.   On: 04/22/2014 16:14     Wt Readings from Last 3 Encounters:  04/23/14 135 lb (61.236 kg)  04/22/14 135 lb (61.236 kg)  03/21/14 135 lb (61.236 kg)     Past Medical History  Diagnosis Date  . Sleep apnea     No CPAP  . Gastritis   . Plantar fasciitis   . Diabetes mellitus   . Hyperlipidemia   . Asthma   . Urticaria   . Parkinson disease   . CHF (congestive heart failure)     Current Outpatient Prescriptions  Medication Sig Dispense Refill  . albuterol (PROVENTIL HFA;VENTOLIN HFA) 108 (90 BASE) MCG/ACT inhaler Inhale 2 puffs into the lungs every 6 (six) hours as needed for wheezing.  1 Inhaler  1  . albuterol (PROVENTIL) (2.5 MG/3ML) 0.083% nebulizer solution Take 3 mLs (2.5 mg total) by nebulization 2 (two) times daily. Dx 493.9  75 mL  2  . beclomethasone (QVAR) 80 MCG/ACT inhaler Inhale 2 puffs into the lungs 2 (two) times daily. Dx 493.20  1 Inhaler  12  . carbidopa-levodopa (SINEMET) 25-100 MG per tablet Take 1 tablet by mouth 3 (three) times daily. For parkinsons      . cyclobenzaprine (FLEXERIL) 5 MG tablet Take 1 tablet (5 mg  total) by mouth 3 (three) times daily as needed for muscle spasms.  30 tablet  1  . EPINEPHrine (EPIPEN) 0.3 mg/0.3 mL SOAJ injection Inject 0.3 mg into the muscle as needed (allergic reaction).      . furosemide (LASIX) 40 MG tablet Take 1 tablet (40 mg total) by mouth 2 (two) times daily.  180 tablet  3  . gabapentin (NEURONTIN) 100 MG capsule Take 1-2 capsules (100-200 mg total) by mouth at bedtime as needed (for muscle pain).  60 capsule  11  . HYDROcodone-acetaminophen (NORCO/VICODIN) 5-325 MG per tablet Take 1 tablet by mouth every 6 (six) hours as needed for moderate pain.       . meloxicam (MOBIC) 7.5 MG tablet Take 7.5 mg by mouth daily as needed for pain.      . metFORMIN (GLUCOPHAGE) 500 MG  tablet TAKE TWO TABLETS BY MOUTH TWICE DAILY WITH A MEAL  180 tablet  0  . metoprolol succinate (TOPROL-XL) 50 MG 24 hr tablet Take 1 tablet (50 mg total) by mouth daily.  30 tablet  6  . metroNIDAZOLE (METROGEL) 1 % gel Apply 1 application topically daily. Apply to rosacea      . nitroGLYCERIN (NITROSTAT) 0.4 MG SL tablet Place 1 tablet (0.4 mg total) under the tongue every 5 (five) minutes as needed for chest pain. Take one tablet and place under tongue if develop chest pain may repeat x2  25 tablet  2  . potassium chloride SA (K-DUR,KLOR-CON) 20 MEQ tablet Take 1 tablet (20 mEq total) by mouth daily.  90 tablet  3  . PRESCRIPTION MEDICATION Take 1 application by mouth daily as needed (for rosacea). Cream from medication assistance program      . ranitidine (ZANTAC) 150 MG tablet Take 1 tablet (150 mg total) by mouth daily.  90 tablet  4  . sertraline (ZOLOFT) 100 MG tablet Take 1 tablet (100 mg total) by mouth at bedtime.  30 tablet  6  . valsartan (DIOVAN) 80 MG tablet Take 1 tablet (80 mg total) by mouth daily.  90 tablet  3   No current facility-administered medications for this visit.     Allergies:   Aspirin; Penicillins; Prempro; Ace inhibitors; and Simvastatin   Social History:  The patient  reports that she has been passively smoking.  She does not have any smokeless tobacco history on file. She reports that she does not drink alcohol or use illicit drugs.   Family History:  The patient's family history includes Coronary artery disease in her father and mother; Heart attack in her father and mother.   ROS:  Please see the history of present illness.       All other systems reviewed and negative.    PHYSICAL EXAM: VS:  BP 137/70  Pulse 91  Ht 5\' 2"  (1.575 m)  Wt 135 lb (61.236 kg)  BMI 24.69 kg/m2 Well nourished, well developed, in no acute distress HEENT: normal Neck: no JVD Cardiac:  normal S1, S2; RRR; no murmurno S3 Lungs:  Coarse breath sounds at the bases (left  greater than right), no rales Abd: soft, nontender, no hepatomegaly Ext: no edema Skin: warm and dry Neuro:  CNs 2-12 intact, no focal abnormalities noted  EKG:  From 9/22 done at primary care-NSR, HR 93, LBBB, occasionally ventricular paced      ASSESSMENT AND PLAN:  1. Acute on chronic systolic heart failure:  She does admit to dietary indiscretion with salt recently. She's had some signs  and symptoms of acute on chronic heart failure. Chest x-ray yesterday demonstrated no edema or infiltrate. She had her ICD interrogated today. This demonstrated no therapies. Optivol measurements are actually below baseline.  There was a minor up-tick in her fluid status over the past few days. I agree with continuing her Lasix at 40 mg twice a day for 3 days. She should then resume 40 mg daily. We will check a basic metabolic panel, BNP and CBC today. If her renal function is stable and her potassium is normal, I will consider adding spironolactone to her regimen. I have also advised her to take an extra half dose of Lasix if she knows she is going to have a high salt meal. We did discuss the importance of limiting her sodium intake to about 2000 mg a day. 2. NICM (nonischemic cardiomyopathy):  Continue beta blocker, ARB. Consider addition of spironolactone as noted above. ECG yesterday demonstrated left bundle-branch block morphology. This  has been evident in the past and is intermittent. 3. HYPERTENSION, BENIGN SYSTEMIC:  Controlled. 4. Type II diabetes mellitus with complication:  Good control. Last A1c 6.6. 5. ICD (implantable cardioverter-defibrillator), dual, in situ:  Follow up with EP as planned. Device was interrogated today as noted above. No therapies have been delivered.   Disposition:   FU with me or Dr. Burt Knack in 2-3 weeks.   Signed, Versie Starks, MHS 04/23/2014 9:19 AM    [facility] [place], [place], [place]  [address] Phone: [phone]; Fax: [phone]

## 2014-04-23 NOTE — Assessment & Plan Note (Signed)
Patient with signs of exacerbation. Will send for chest x-ray and have her follow-up with cardiology. EKG with changes since previous, although seems it may be similar to an EKG to which I do not have access (per cardiology notes). Recommended doubling lasix for 3 days with precautions for continued weight gain. Since patient denies salt indiscretion, concern for possible cardiac decompensation, otherwise unsure what elicited exacerbation. Patient without chest pain and is stable. Return precautions discussed before patient left office.

## 2014-04-24 ENCOUNTER — Telehealth: Payer: Self-pay | Admitting: Nurse Practitioner

## 2014-04-24 DIAGNOSIS — I5022 Chronic systolic (congestive) heart failure: Secondary | ICD-10-CM

## 2014-04-24 MED ORDER — SPIRONOLACTONE 25 MG PO TABS: 25.0000 mg | ORAL_TABLET | Freq: Every day | ORAL | Status: DC

## 2014-04-24 NOTE — Telephone Encounter (Signed)
Reviewed lab results and plan of care with patient who verbalized understanding and states she was writing down instructions.  Patient scheduled for repeat lab work on 9/29 and on 10/7 when she returns for ov with Richardson Dopp, PA-C.

## 2014-04-29 ENCOUNTER — Other Ambulatory Visit (INDEPENDENT_AMBULATORY_CARE_PROVIDER_SITE_OTHER): Payer: Medicare Other | Admitting: *Deleted

## 2014-04-29 DIAGNOSIS — I5022 Chronic systolic (congestive) heart failure: Secondary | ICD-10-CM | POA: Diagnosis not present

## 2014-04-29 LAB — BASIC METABOLIC PANEL
BUN: 16 mg/dL (ref 6–23)
CO2: 24 mEq/L (ref 19–32)
Calcium: 9.6 mg/dL (ref 8.4–10.5)
Chloride: 108 mEq/L (ref 96–112)
Creatinine, Ser: 0.9 mg/dL (ref 0.4–1.2)
GFR: 67.61 mL/min (ref >60.00)
Glucose, Bld: 120 mg/dL — ABNORMAL HIGH (ref 70–99)
Potassium: 4 mEq/L (ref 3.5–5.1)
Sodium: 138 mEq/L (ref 135–145)

## 2014-04-30 ENCOUNTER — Telehealth: Payer: Self-pay | Admitting: *Deleted

## 2014-04-30 NOTE — Telephone Encounter (Signed)
lmptcb for lab results 

## 2014-05-01 NOTE — Telephone Encounter (Signed)
Follow up ° ° ° ° °Returning a nurses call °

## 2014-05-01 NOTE — Telephone Encounter (Signed)
pt notified about lab results with verbal understanding. States has some sob still. I asked pt how was her weight, she said fine, pt was not sob on phone at this time. I educated pt on watching her salt intake and fluid intake. I advised for her to google a low Na diet of 2 gm for guidance. Pt said tries to watch her salt, but she eats things like spaghetti sauce etc. I said spaghetti sauce has a lot of salt in it. I asked pt to monitor wt, call if up 3 lb x 1 day or go to ED if worse. Pt said ok.

## 2014-05-02 ENCOUNTER — Telehealth: Payer: Self-pay | Admitting: Family Medicine

## 2014-05-02 NOTE — Telephone Encounter (Signed)
[facility] called and is requesting chart notes from a visit during the last 6 months faxed to them at [phone]. jw

## 2014-05-05 NOTE — Telephone Encounter (Signed)
No phone number available, unsure what [facility] is, will not fax notes.

## 2014-05-06 ENCOUNTER — Telehealth: Payer: Self-pay | Admitting: Family Medicine

## 2014-05-06 NOTE — Telephone Encounter (Signed)
Needs refill on metformin [place]

## 2014-05-07 ENCOUNTER — Telehealth: Payer: Self-pay | Admitting: *Deleted

## 2014-05-07 ENCOUNTER — Ambulatory Visit: Payer: Medicare Other | Admitting: Physician Assistant

## 2014-05-07 ENCOUNTER — Other Ambulatory Visit (INDEPENDENT_AMBULATORY_CARE_PROVIDER_SITE_OTHER): Payer: Medicare Other | Admitting: *Deleted

## 2014-05-07 DIAGNOSIS — I5022 Chronic systolic (congestive) heart failure: Secondary | ICD-10-CM | POA: Diagnosis not present

## 2014-05-07 LAB — BASIC METABOLIC PANEL
BUN: 16 mg/dL (ref 6–23)
CO2: 25 mEq/L (ref 19–32)
Calcium: 9.3 mg/dL (ref 8.4–10.5)
Chloride: 106 mEq/L (ref 96–112)
Creatinine, Ser: 0.9 mg/dL (ref 0.4–1.2)
GFR: 70.33 mL/min (ref >60.00)
Glucose, Bld: 155 mg/dL — ABNORMAL HIGH (ref 70–99)
Potassium: 3.9 mEq/L (ref 3.5–5.1)
Sodium: 138 mEq/L (ref 135–145)

## 2014-05-07 NOTE — Telephone Encounter (Signed)
pt notified about lab results with verbal understanding  

## 2014-05-09 MED ORDER — METFORMIN HCL 500 MG PO TABS: ORAL_TABLET | ORAL | Status: DC

## 2014-05-09 NOTE — Telephone Encounter (Signed)
Refill sent to [facility], please inform patient.

## 2014-05-09 NOTE — Telephone Encounter (Signed)
Original rx sent to [facility], rx re-sent to [facility], tried calling patient to inform, no answer no vm.

## 2014-05-19 ENCOUNTER — Ambulatory Visit (INDEPENDENT_AMBULATORY_CARE_PROVIDER_SITE_OTHER): Payer: Medicare Other | Admitting: Physician Assistant

## 2014-05-19 ENCOUNTER — Encounter: Payer: Self-pay | Admitting: Physician Assistant

## 2014-05-19 VITALS — BP 125/80 | HR 85 | Ht 62.0 in | Wt 136.0 lb

## 2014-05-19 DIAGNOSIS — E785 Hyperlipidemia, unspecified: Secondary | ICD-10-CM

## 2014-05-19 DIAGNOSIS — I429 Cardiomyopathy, unspecified: Secondary | ICD-10-CM | POA: Diagnosis not present

## 2014-05-19 DIAGNOSIS — I5022 Chronic systolic (congestive) heart failure: Secondary | ICD-10-CM

## 2014-05-19 DIAGNOSIS — I10 Essential (primary) hypertension: Secondary | ICD-10-CM

## 2014-05-19 DIAGNOSIS — R0602 Shortness of breath: Secondary | ICD-10-CM | POA: Diagnosis not present

## 2014-05-19 DIAGNOSIS — I428 Other cardiomyopathies: Secondary | ICD-10-CM

## 2014-05-19 LAB — BASIC METABOLIC PANEL
BUN: 18 mg/dL (ref 6–23)
CO2: 28 mEq/L (ref 19–32)
Calcium: 9.7 mg/dL (ref 8.4–10.5)
Chloride: 105 mEq/L (ref 96–112)
Creatinine, Ser: 0.9 mg/dL (ref 0.4–1.2)
GFR: 69.39 mL/min (ref >60.00)
Glucose, Bld: 108 mg/dL — ABNORMAL HIGH (ref 70–99)
Potassium: 4.2 mEq/L (ref 3.5–5.1)
Sodium: 141 mEq/L (ref 135–145)

## 2014-05-19 LAB — BRAIN NATRIURETIC PEPTIDE: Pro B Natriuretic peptide (BNP): 67 pg/mL (ref 0.0–100.0)

## 2014-05-19 NOTE — Patient Instructions (Signed)
Your physician recommends that you continue on your current medications as directed. Please refer to the Current Medication list given to you today.  Lab work today. BMET, BNP  Monitor weight daily if weight goes up 2-3 pounds in one day,or more swelling, or more shortness of breath then ok to take extra 1/2 tablet of your lasix and 1/2 tablet of potassium.   Keep follow up appt. With Dr. Burt Knack as planned.

## 2014-05-19 NOTE — Progress Notes (Signed)
Cardiology Office Note   Date:  05/19/2014   ID:  Veronica Shaw, DOB [birth year], MRN [id number]  PCP:  Beverlyn Roux, MD  Cardiologist:  Dr. Sherren Mocha   Electrophysiologist:  Dr. Cristopher Peru    History of Present Illness: Veronica Shaw is a 63 y.o. female with a hx of nonischemic cardiomyopathy, systolic CHF, s/p AICD 12/2227, diabetes, HL, sleep apnea, asthma.     I saw her in 9/23 with evidence of volume excess. Her Lasix was adjusted for 3 days. Patient admitted to a diet rich in salt. We discussed salt restriction.  I started her on spironolactone. She returns for follow up.    She misunderstood my directions and stopped the Lasix.  She did go to [place] and noted increased swelling and dyspnea while she was away.  Now that she is home, she notes improved dyspnea.  She has chronic DOE and is NYHA 2b-3.  She sleeps on 4 pillows chronically.  She denies PND, recent chest pain or syncope.    Studies:  - LHC (10/14):  Minor luminal irregularity in prox LAD, EF 35%  - Echo (2/15):  EF 30-35%, diff HK, ant-sept AK, Gr 2 DD, mild MR, trivial TR  - Nuclear (5/13):  Normal stress nuclear study. LV Ejection Fraction: 58%  - Cardiac MRI (4/15):  1. Mild LVE, EF 34%, Diff HK, paradoxical septal motion c/w LBBB, small focal area of late gadolinium enhancement at basal anteroseptum - may represent RV volume overload or a focal infiltrative disease such as sarcoidosis, but certainly wouldn't explain the degree of of LV dysfunction. - c/w NICM, normal RV size and function (RVEF 61%), Mild MR and TR   Recent Labs/Images:  05/31/2013: HDL Cholesterol by NMR 49; LDL (calc) 95  10/31/2013: ALT 15; AST 24  04/23/2014: Hemoglobin 14.3; Pro B Natriuretic peptide (BNP) 80.0; WBC 6.3  05/07/2014: BUN 16; Creatinine 0.9; Potassium 3.9; Sodium 138     Wt Readings from Last 3 Encounters:  05/19/14 136 lb (61.689 kg)  04/23/14 135 lb (61.236 kg)  04/22/14 135 lb (61.236 kg)     Past Medical  History  Diagnosis Date  . Sleep apnea     No CPAP  . Gastritis   . Plantar fasciitis   . Diabetes mellitus   . Hyperlipidemia   . Asthma   . Urticaria   . Parkinson disease   . CHF (congestive heart failure)     Current Outpatient Prescriptions  Medication Sig Dispense Refill  . albuterol (PROVENTIL HFA;VENTOLIN HFA) 108 (90 BASE) MCG/ACT inhaler Inhale 2 puffs into the lungs every 6 (six) hours as needed for wheezing.  1 Inhaler  1  . albuterol (PROVENTIL) (2.5 MG/3ML) 0.083% nebulizer solution Take 3 mLs (2.5 mg total) by nebulization 2 (two) times daily. Dx 493.9  75 mL  2  . beclomethasone (QVAR) 80 MCG/ACT inhaler Inhale 2 puffs into the lungs 2 (two) times daily. Dx 493.20  1 Inhaler  12  . carbidopa-levodopa (SINEMET) 25-100 MG per tablet Take 1 tablet by mouth 3 (three) times daily. For parkinsons      . cyclobenzaprine (FLEXERIL) 5 MG tablet Take 1 tablet (5 mg total) by mouth 3 (three) times daily as needed for muscle spasms.  30 tablet  1  . EPINEPHrine (EPIPEN) 0.3 mg/0.3 mL SOAJ injection Inject 0.3 mg into the muscle as needed (allergic reaction).      . gabapentin (NEURONTIN) 100 MG capsule Take 1-2 capsules (100-200 mg total)  by mouth at bedtime as needed (for muscle pain).  60 capsule  11  . HYDROcodone-acetaminophen (NORCO/VICODIN) 5-325 MG per tablet Take 1 tablet by mouth every 6 (six) hours as needed for moderate pain.       . meloxicam (MOBIC) 7.5 MG tablet Take 7.5 mg by mouth daily as needed for pain.      . metFORMIN (GLUCOPHAGE) 500 MG tablet TAKE TWO TABLETS BY MOUTH TWICE DAILY WITH A MEAL  180 tablet  3  . metoprolol succinate (TOPROL-XL) 50 MG 24 hr tablet Take 1 tablet (50 mg total) by mouth daily.  30 tablet  6  . metroNIDAZOLE (METROGEL) 1 % gel Apply 1 application topically daily. Apply to rosacea      . nitroGLYCERIN (NITROSTAT) 0.4 MG SL tablet Place 1 tablet (0.4 mg total) under the tongue every 5 (five) minutes as needed for chest pain. Take one  tablet and place under tongue if develop chest pain may repeat x2  25 tablet  2  . PRESCRIPTION MEDICATION Take 1 application by mouth daily as needed (for rosacea). Cream from medication assistance program      . ranitidine (ZANTAC) 150 MG tablet Take 1 tablet (150 mg total) by mouth daily.  90 tablet  4  . sertraline (ZOLOFT) 100 MG tablet Take 1 tablet (100 mg total) by mouth at bedtime.  30 tablet  6  . spironolactone (ALDACTONE) 25 MG tablet Take 1 tablet (25 mg total) by mouth daily.  31 tablet  11  . valsartan (DIOVAN) 80 MG tablet Take 1 tablet (80 mg total) by mouth daily.  90 tablet  3   No current facility-administered medications for this visit.     Allergies:   Aspirin; Penicillins; Prempro; Ace inhibitors; and Simvastatin   Social History:  The patient  reports that she has never smoked. She does not have any smokeless tobacco history on file. She reports that she does not drink alcohol or use illicit drugs.   Family History:  The patient's family history includes Coronary artery disease in her father and mother; Heart attack in her father and mother.   ROS:  Please see the history of present illness.  She has a chronic NP cough.   All other systems reviewed and negative.    PHYSICAL EXAM: VS:  BP 125/80  Pulse 85  Ht 5\' 2"  (1.575 m)  Wt 136 lb (61.689 kg)  BMI 24.87 kg/m2 Well nourished, well developed, in no acute distress HEENT: normal Neck: no JVD Cardiac:  normal S1, S2; RRR; no murmurno S3 Lungs:  Decreased breath sounds bilaterally, no wheezing, no rhonchi, no rales Abd: soft, nontender, no hepatomegaly Ext: no edema Skin: warm and dry Neuro:  CNs 2-12 intact, no focal abnormalities noted  EKG:  NSR, HR 85, LBBB   ASSESSMENT AND PLAN:  1.  Chronic systolic heart failure:   Volume stable.  She stopped Lasix by mistake.  However, she is stable. I will check a BMET and BNP.  If her BNP is increasing, I will resume Lasix at 20 mg QD along with K+ 10 mEq QD.   Otherwise, she can take Lasix and K+ prn for 2-3 lb weight gain in 1 day or increased edema or dyspnea.  2.  NICM (nonischemic cardiomyopathy):  Continue beta blocker, ARB, spironolactone. 3.  HYPERTENSION, BENIGN SYSTEMIC:   Controlled. 4.  Type II diabetes mellitus with complication:  Good control. Last A1c 6.6. 5.  ICD (implantable cardioverter-defibrillator), dual, in situ:  Follow up with EP as planned.     Disposition:   FU with Dr. Sherren Mocha as planned or sooner if she has worsening symptoms.    Signed, Versie Starks, MHS 05/19/2014 2:14 PM    [facility] [place], [place], [place]  [address] Phone: [phone]; Fax: [phone]

## 2014-05-20 ENCOUNTER — Telehealth: Payer: Self-pay | Admitting: *Deleted

## 2014-05-20 ENCOUNTER — Ambulatory Visit (INDEPENDENT_AMBULATORY_CARE_PROVIDER_SITE_OTHER): Payer: Medicare Other | Admitting: Family Medicine

## 2014-05-20 ENCOUNTER — Encounter: Payer: Self-pay | Admitting: Family Medicine

## 2014-05-20 VITALS — BP 133/66 | HR 88 | Temp 98.2°F | Ht 62.0 in | Wt 137.8 lb

## 2014-05-20 DIAGNOSIS — E118 Type 2 diabetes mellitus with unspecified complications: Secondary | ICD-10-CM | POA: Diagnosis not present

## 2014-05-20 DIAGNOSIS — I5022 Chronic systolic (congestive) heart failure: Secondary | ICD-10-CM | POA: Diagnosis not present

## 2014-05-20 DIAGNOSIS — M2012 Hallux valgus (acquired), left foot: Secondary | ICD-10-CM | POA: Diagnosis not present

## 2014-05-20 DIAGNOSIS — Z23 Encounter for immunization: Secondary | ICD-10-CM

## 2014-05-20 DIAGNOSIS — M21612 Bunion of left foot: Secondary | ICD-10-CM | POA: Insufficient documentation

## 2014-05-20 LAB — POCT GLYCOSYLATED HEMOGLOBIN (HGB A1C): Hemoglobin A1C: 6.6

## 2014-05-20 MED ORDER — POTASSIUM CHLORIDE ER 10 MEQ PO TBCR: 10.0000 meq | EXTENDED_RELEASE_TABLET | Freq: Every day | ORAL | Status: DC

## 2014-05-20 MED ORDER — SPIRONOLACTONE 25 MG PO TABS
25.0000 mg | ORAL_TABLET | Freq: Every day | ORAL | Status: DC
Start: 2014-05-20 — End: 2015-07-03

## 2014-05-20 MED ORDER — FUROSEMIDE 20 MG PO TABS: 20.0000 mg | ORAL_TABLET | Freq: Every day | ORAL | Status: DC

## 2014-05-20 NOTE — Telephone Encounter (Signed)
pt notified about lab results with verbal understanding and only take lasix 20 and K+ 10 PRN for 2-3 wt gain or more sob or edema. Pt said ok and thank you

## 2014-05-20 NOTE — Patient Instructions (Addendum)
I have approved your testing supplies and will send a script for the spironolactone to [facility]

## 2014-05-20 NOTE — Telephone Encounter (Signed)
pt notified about lab results with verbal understanding and only take lasix 20 and K+ 10 meq PRN.

## 2014-05-30 ENCOUNTER — Other Ambulatory Visit: Payer: Self-pay | Admitting: *Deleted

## 2014-05-30 MED ORDER — VALSARTAN 80 MG PO TABS: 80.0000 mg | ORAL_TABLET | Freq: Every day | ORAL | Status: DC

## 2014-06-09 ENCOUNTER — Encounter: Payer: Self-pay | Admitting: Internal Medicine

## 2014-06-09 ENCOUNTER — Ambulatory Visit (INDEPENDENT_AMBULATORY_CARE_PROVIDER_SITE_OTHER): Payer: Medicare Other | Admitting: *Deleted

## 2014-06-09 DIAGNOSIS — I429 Cardiomyopathy, unspecified: Secondary | ICD-10-CM | POA: Diagnosis not present

## 2014-06-09 DIAGNOSIS — I428 Other cardiomyopathies: Secondary | ICD-10-CM

## 2014-06-09 LAB — MDC_IDC_ENUM_SESS_TYPE_REMOTE
Battery Remaining Longevity: 131 mo
Battery Voltage: 3.04 V
Brady Statistic AP VP Percent: 0 %
Brady Statistic AP VS Percent: 0 %
Brady Statistic AS VP Percent: 0 %
Brady Statistic AS VS Percent: 100 %
Brady Statistic RA Percent Paced: 0 %
Brady Statistic RV Percent Paced: 0 %
Date Time Interrogation Session: 20151109144844
HighPow Impedance: 79 Ohm
Lead Channel Impedance Value: 4047 Ohm
Lead Channel Impedance Value: 4047 Ohm
Lead Channel Impedance Value: 4047 Ohm
Lead Channel Impedance Value: 456 Ohm
Lead Channel Impedance Value: 703 Ohm
Lead Channel Impedance Value: 760 Ohm
Lead Channel Pacing Threshold Amplitude: 0.5 V
Lead Channel Pacing Threshold Amplitude: 0.625 V
Lead Channel Pacing Threshold Pulse Width: 0.4 ms
Lead Channel Pacing Threshold Pulse Width: 0.4 ms
Lead Channel Sensing Intrinsic Amplitude: 16 mV
Lead Channel Sensing Intrinsic Amplitude: 2.25 mV
Lead Channel Setting Pacing Amplitude: 2 V
Lead Channel Setting Pacing Amplitude: 2.5 V
Lead Channel Setting Pacing Pulse Width: 0.4 ms
Lead Channel Setting Sensing Sensitivity: 0.3 mV
Zone Setting Detection Interval: 300 ms
Zone Setting Detection Interval: 350 ms
Zone Setting Detection Interval: 360 ms
Zone Setting Detection Interval: 360 ms

## 2014-06-09 NOTE — Progress Notes (Signed)
Remote ICD transmission.   

## 2014-06-16 NOTE — Progress Notes (Signed)
   Subjective:    Patient ID: Veronica Shaw, female    DOB: 04-18-1951, 63 y.o.   MRN: [id number]  HPI CHRONIC DIABETES  Disease Monitoring  Blood Sugar Ranges: 100-150  Polyuria: no  (only after lasix)  Visual problems: no   Medication Compliance: yes  Medication Side Effects  Hypoglycemia: no   Preventitive Health Care  Eye Exam: 10/2013  Foot Exam: today  Diet pattern: well balanced, tid  Exercise: walks 3-4x/week   Review of Systems See HPI    Objective:   Physical Exam  Constitutional: She is oriented to person, place, and time. She appears well-developed and well-nourished. No distress.  HENT:  Head: Normocephalic and atraumatic.  Eyes: Conjunctivae are normal. Right eye exhibits no discharge. Left eye exhibits no discharge. No scleral icterus.  Cardiovascular: Normal rate, regular rhythm, normal heart sounds and intact distal pulses.   No murmur heard. Pulmonary/Chest: Effort normal and breath sounds normal. No respiratory distress. She has no wheezes. She has no rales.  Abdominal: Bowel sounds are normal. She exhibits no distension.  Neurological: She is alert and oriented to person, place, and time.  Skin: Skin is warm and dry. No rash noted. She is not diaphoretic.  Psychiatric: She has a normal mood and affect. Her behavior is normal.  Nursing note and vitals reviewed.         Assessment & Plan:

## 2014-06-16 NOTE — Assessment & Plan Note (Signed)
H/o bunionectomy and recurrence, no pain right now.  - diabetic shoes prescribed

## 2014-06-16 NOTE — Assessment & Plan Note (Signed)
Managed by Lovena Le with cardiology. Needs new spiro script for mail order pharmacy, written today. Asymptomatic today w/o signs of volume overload

## 2014-06-16 NOTE — Assessment & Plan Note (Signed)
Well controlled, A1c 6.6 - continue metformin 1g bid

## 2014-06-17 ENCOUNTER — Encounter: Payer: Self-pay | Admitting: Cardiology

## 2014-06-27 DIAGNOSIS — S0500XA Injury of conjunctiva and corneal abrasion without foreign body, unspecified eye, initial encounter: Secondary | ICD-10-CM | POA: Diagnosis not present

## 2014-06-27 DIAGNOSIS — H0015 Chalazion left lower eyelid: Secondary | ICD-10-CM | POA: Diagnosis not present

## 2014-07-08 ENCOUNTER — Other Ambulatory Visit: Payer: Self-pay | Admitting: *Deleted

## 2014-07-08 MED ORDER — METOPROLOL SUCCINATE ER 50 MG PO TB24: 50.0000 mg | ORAL_TABLET | Freq: Every day | ORAL | Status: DC

## 2014-07-08 MED ORDER — METFORMIN HCL 500 MG PO TABS: ORAL_TABLET | ORAL | Status: DC

## 2014-07-08 MED ORDER — SERTRALINE HCL 100 MG PO TABS: 100.0000 mg | ORAL_TABLET | Freq: Every day | ORAL | Status: DC

## 2014-07-08 MED ORDER — FUROSEMIDE 20 MG PO TABS: 20.0000 mg | ORAL_TABLET | Freq: Every day | ORAL | Status: DC

## 2014-07-10 ENCOUNTER — Encounter (HOSPITAL_COMMUNITY): Payer: Self-pay | Admitting: Cardiovascular Disease

## 2014-07-21 DIAGNOSIS — S0500XA Injury of conjunctiva and corneal abrasion without foreign body, unspecified eye, initial encounter: Secondary | ICD-10-CM | POA: Diagnosis not present

## 2014-07-21 DIAGNOSIS — H0015 Chalazion left lower eyelid: Secondary | ICD-10-CM | POA: Diagnosis not present

## 2014-07-28 ENCOUNTER — Telehealth: Payer: Self-pay | Admitting: Cardiovascular Disease

## 2014-07-28 MED ORDER — POTASSIUM CHLORIDE ER 10 MEQ PO TBCR: 10.0000 meq | EXTENDED_RELEASE_TABLET | Freq: Every day | ORAL | Status: DC

## 2014-07-28 NOTE — Telephone Encounter (Signed)
Patient reports that she has been increasingly Sob past 3 days. Able to speak in complete sentences, but admits to gaining about 5 lbs. She has been using her nebulizer treatments more frequently. Per Dr. Mare Ferrari, she needs to increase Lasix to 40 mg daily x 3 days, then resume 20 mg daily. Continue Potassium as directed. Needs a refill. Will call to [facility]. Patient voices good understanding.

## 2014-07-28 NOTE — Telephone Encounter (Signed)
New message     Patient calling    Pt C/O Shortness Of Breath: STAT if SOB developed within the last 24 hours or pt is noticeably SOB on the phone  1. Are you currently SOB (can you hear that pt is SOB on the phone)? No   2. How long have you been experiencing SOB? 3 days. yesterday was worse   3. Are you SOB when sitting or when up moving around?  More when getting up    4. Are you currently experiencing any other symptoms? urination & leg cramps

## 2014-08-04 ENCOUNTER — Telehealth: Payer: Self-pay | Admitting: Family Medicine

## 2014-08-04 NOTE — Telephone Encounter (Signed)
Pt called and needs a refill sent to

## 2014-08-28 ENCOUNTER — Other Ambulatory Visit: Payer: Self-pay | Admitting: Family Medicine

## 2014-08-28 MED ORDER — VALSARTAN 80 MG PO TABS: 80.0000 mg | ORAL_TABLET | Freq: Every day | ORAL | Status: DC

## 2014-08-28 NOTE — Telephone Encounter (Signed)
Pt requesting refill on valsartan, uses [facility] mail order pharmacy

## 2014-08-29 DIAGNOSIS — H269 Unspecified cataract: Secondary | ICD-10-CM | POA: Diagnosis not present

## 2014-08-29 DIAGNOSIS — H04123 Dry eye syndrome of bilateral lacrimal glands: Secondary | ICD-10-CM | POA: Diagnosis not present

## 2014-08-29 DIAGNOSIS — H01009 Unspecified blepharitis unspecified eye, unspecified eyelid: Secondary | ICD-10-CM | POA: Diagnosis not present

## 2014-08-29 DIAGNOSIS — H0019 Chalazion unspecified eye, unspecified eyelid: Secondary | ICD-10-CM | POA: Diagnosis not present

## 2014-09-10 ENCOUNTER — Ambulatory Visit (INDEPENDENT_AMBULATORY_CARE_PROVIDER_SITE_OTHER): Payer: Medicare Other | Admitting: *Deleted

## 2014-09-10 DIAGNOSIS — I428 Other cardiomyopathies: Secondary | ICD-10-CM

## 2014-09-10 DIAGNOSIS — I429 Cardiomyopathy, unspecified: Secondary | ICD-10-CM | POA: Diagnosis not present

## 2014-09-10 NOTE — Progress Notes (Signed)
Remote ICD transmission.   

## 2014-09-12 LAB — MDC_IDC_ENUM_SESS_TYPE_REMOTE
Battery Remaining Longevity: 129 mo
Battery Voltage: 3.02 V
Brady Statistic AP VP Percent: 0 %
Brady Statistic AP VS Percent: 0.19 %
Brady Statistic AS VP Percent: 0.04 %
Brady Statistic AS VS Percent: 99.77 %
Brady Statistic RA Percent Paced: 0.19 %
Brady Statistic RV Percent Paced: 0.04 %
Date Time Interrogation Session: 20160210062305
HighPow Impedance: 71 Ohm
Lead Channel Impedance Value: 4047 Ohm
Lead Channel Impedance Value: 4047 Ohm
Lead Channel Impedance Value: 4047 Ohm
Lead Channel Impedance Value: 418 Ohm
Lead Channel Impedance Value: 608 Ohm
Lead Channel Impedance Value: 722 Ohm
Lead Channel Pacing Threshold Amplitude: 0.5 V
Lead Channel Pacing Threshold Amplitude: 0.5 V
Lead Channel Pacing Threshold Pulse Width: 0.4 ms
Lead Channel Pacing Threshold Pulse Width: 0.4 ms
Lead Channel Sensing Intrinsic Amplitude: 18 mV
Lead Channel Sensing Intrinsic Amplitude: 18 mV
Lead Channel Sensing Intrinsic Amplitude: 2 mV
Lead Channel Sensing Intrinsic Amplitude: 2 mV
Lead Channel Setting Pacing Amplitude: 2 V
Lead Channel Setting Pacing Amplitude: 2.5 V
Lead Channel Setting Pacing Pulse Width: 0.4 ms
Lead Channel Setting Sensing Sensitivity: 0.3 mV
Zone Setting Detection Interval: 300 ms
Zone Setting Detection Interval: 350 ms
Zone Setting Detection Interval: 360 ms
Zone Setting Detection Interval: 360 ms

## 2014-09-17 ENCOUNTER — Ambulatory Visit (INDEPENDENT_AMBULATORY_CARE_PROVIDER_SITE_OTHER): Payer: Medicare Other | Admitting: Cardiovascular Disease

## 2014-09-17 ENCOUNTER — Encounter: Payer: Self-pay | Admitting: Cardiovascular Disease

## 2014-09-17 VITALS — BP 90/54 | HR 83 | Ht 62.0 in | Wt 136.0 lb

## 2014-09-17 DIAGNOSIS — I10 Essential (primary) hypertension: Secondary | ICD-10-CM

## 2014-09-17 DIAGNOSIS — I5022 Chronic systolic (congestive) heart failure: Secondary | ICD-10-CM

## 2014-09-17 DIAGNOSIS — I429 Cardiomyopathy, unspecified: Secondary | ICD-10-CM

## 2014-09-17 DIAGNOSIS — I428 Other cardiomyopathies: Secondary | ICD-10-CM

## 2014-09-17 NOTE — Progress Notes (Signed)
Cardiology Office Note   Date:  09/17/2014   ID:  Veronica, Shaw 04-Aug-1950, MRN [id number]  PCP:  Beverlyn Roux, MD  Cardiologist:  Sherren Mocha, MD    Chief Complaint  Patient presents with  . Shortness of Breath    chronic issue     History of Present Illness: Veronica Shaw is a 64 y.o. female who presents for follow-up of chronic systolic heart failure.  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 She was hospitalized in October 2014 with chest pain or shortness of breath. At that time she was diagnosed with a cardiomyopathy with LVEF estimated at 35% as well as a left bundle branch block pattern on EKG. Cardiac catheterization demonstrated patent coronary arteries, normal intracardiac hemodynamics, and severe LV dysfunction. The patient and persistent severe LV dysfunction despite optimal medical therapy. She underwent ICD implantation in April 2015 by Dr. Lovena Le. She has had intermittent left bundle branch block.  She complains of occasional shortness of breath, but not consistently related to physical exertion. No edema, but does have PND at times. She sleeps on 4 pillows but this is chronic and unchanged. No substernal chest pain. She has some discomfort around her ICD. She denies lightheadedness, dizziness, or syncope.  Past Medical History  Diagnosis Date  . Sleep apnea     No CPAP  . Gastritis   . Plantar fasciitis   . Diabetes mellitus   . Hyperlipidemia   . Asthma   . Urticaria   . Parkinson disease   . CHF (congestive heart failure)     Past Surgical History  Procedure Laterality Date  . Tubal ligation    . Cholecystectomy    . Bunionectomy    . Implantable cardioverter defibrillator implant  11-25-13    MDT dual chamber ICD implanted by [name] for primary prevention  . Left and right heart catheterization with coronary angiogram N/A 05/31/2013    Procedure: LEFT AND RIGHT HEART CATHETERIZATION  WITH CORONARY ANGIOGRAM;  Surgeon: [name], MD;  Location: [facility] CATH LAB;  Service: Cardiovascular;  Laterality: N/A;  . Implantable cardioverter defibrillator implant N/A 11/25/2013    Procedure: IMPLANTABLE CARDIOVERTER DEFIBRILLATOR IMPLANT;  Surgeon: [name], MD;  Location: [facility] CATH LAB;  Service: Cardiovascular;  Laterality: N/A;    Current Outpatient Prescriptions  Medication Sig Dispense Refill  . albuterol (PROVENTIL HFA;VENTOLIN HFA) 108 (90 BASE) MCG/ACT inhaler Inhale 2 puffs into the lungs every 6 (six) hours as needed for wheezing. 1 Inhaler 1  . albuterol (PROVENTIL) (2.5 MG/3ML) 0.083% nebulizer solution Take 3 mLs (2.5 mg total) by nebulization 2 (two) times daily. Dx 493.9 75 mL 2  . beclomethasone (QVAR) 80 MCG/ACT inhaler Inhale 2 puffs into the lungs 2 (two) times daily. Dx 493.20 1 Inhaler 12  . cyclobenzaprine (FLEXERIL) 5 MG tablet Take 1 tablet (5 mg total) by mouth 3 (three) times daily as needed for muscle spasms. 30 tablet 1  . EPINEPHrine (EPIPEN) 0.3 mg/0.3 mL SOAJ injection Inject 0.3 mg into the muscle as needed (allergic reaction).    . furosemide (LASIX) 20 MG tablet Take 1 tablet (20 mg total) by mouth daily. 90 tablet 4  . gabapentin (NEURONTIN) 100 MG capsule Take 1-2 capsules (100-200 mg total) by mouth at bedtime as needed (for muscle pain). 60 capsule 11  . meloxicam (MOBIC) 7.5 MG tablet Take 7.5 mg by mouth daily as needed for pain.    [name] Kitchen  metFORMIN (GLUCOPHAGE) 500 MG tablet TAKE TWO TABLETS BY MOUTH TWICE DAILY WITH A MEAL 360 tablet 4  . metoprolol succinate (TOPROL-XL) 50 MG 24 hr tablet Take 1 tablet (50 mg total) by mouth daily. 90 tablet 4  . metroNIDAZOLE (METROGEL) 1 % gel Apply 1 application topically daily. Apply to rosacea    . nitroGLYCERIN (NITROSTAT) 0.4 MG SL tablet Place 1 tablet (0.4 mg total) under the tongue every 5 (five) minutes as needed for chest pain. Take one tablet and place under tongue if develop chest pain may repeat  x2 25 tablet 2  . potassium chloride (K-DUR) 10 MEQ tablet Take 1 tablet (10 mEq total) by mouth daily. 60 tablet 6  . PRESCRIPTION MEDICATION Take 1 application by mouth daily as needed (for rosacea). Cream from medication assistance program    . ranitidine (ZANTAC) 150 MG tablet Take 1 tablet (150 mg total) by mouth daily. 90 tablet 4  . sertraline (ZOLOFT) 100 MG tablet Take 1 tablet (100 mg total) by mouth at bedtime. 90 tablet 4  . spironolactone (ALDACTONE) 25 MG tablet Take 1 tablet (25 mg total) by mouth daily. 90 tablet 3  . valsartan (DIOVAN) 80 MG tablet Take 1 tablet (80 mg total) by mouth daily. 90 tablet 3  . carbidopa-levodopa (SINEMET) 25-100 MG per tablet Take 1 tablet by mouth 3 (three) times daily. For parkinsons     No current facility-administered medications for this visit.    Allergies:   Aspirin; Penicillins; Prempro; Ace inhibitors; and Simvastatin   Social History:  The patient  reports that she has never smoked. She does not have any smokeless tobacco history on file. She reports that she does not drink alcohol or use illicit drugs.   Family History:  The patient's  family history includes Coronary artery disease in her father and mother; Heart attack in her father and mother.    ROS:  Please see the history of present illness.  Otherwise, review of systems is positive for hiccups, PND, orthopnea, cough, and snoring.  All other systems are reviewed and negative.   PHYSICAL EXAM: VS:  BP 90/54 mmHg  Pulse 83  Ht 5\' 2"  (1.575 m)  Wt 136 lb (61.689 kg)  BMI 24.87 kg/m2  SpO2 91% , BMI Body mass index is 24.87 kg/(m^2). GEN: Well nourished, well developed, in no acute distress HEENT: normal Neck: no JVD, no masses, no carotid bruits Cardiac: RRR without murmur or gallop                Respiratory:  clear to auscultation bilaterally, normal work of breathing Chest: tender around ICD without signs of ecchymosis, erythema, or swelling GI: soft, nontender,  nondistended, + BS MS: no deformity or atrophy Ext: no pretibial edema Skin: warm and dry, no rash Neuro:  Strength and sensation are intact Psych: euthymic mood, full affect  EKG:  EKG is not ordered today.  Recent Labs: 10/31/2013: ALT 15 04/23/2014: Hemoglobin 14.3; Platelets 204.0 05/19/2014: BUN 18; Creatinine 0.9; Potassium 4.2; Pro B Natriuretic peptide (BNP) 67.0; Sodium 141   Lipid Panel     Component Value Date/Time   CHOL 172 05/31/2013 0435   TRIG 142 05/31/2013 0435   HDL 49 05/31/2013 0435   CHOLHDL 3.5 05/31/2013 0435   VLDL 28 05/31/2013 0435   LDLCALC 95 05/31/2013 0435   LDLDIRECT 106* 10/13/2011 0921      Wt Readings from Last 3 Encounters:  09/17/14 136 lb (61.689 kg)  05/20/14 137 lb  12.8 oz (62.506 kg)  05/19/14 136 lb (61.689 kg)     Cardiac Studies Reviewed: Cardiac MRI 4.3.2015: FINDINGS: 1. Left ventricle is mildly dilated with normal thickness and moderately to severely decreased systolic function (ZOXW=96%). There is diffuse hypokinesis with pronounced paradoxical septal motion consistent with LBBB.  LVEDV: 138 ml  LVESV: 90 ml  SV: 47 ml  CO: 3.1 L/minute  Myocardial mass: 134 cm  LVESD: 55 mm  2. Right ventricle has normal size, thickness and function (RVEF=61%). There are no regional wall motion abnormalities.  RVEDV: 79 ml  RVESV: 31 ml  SV: 48 ml  CO: 3.1 L/minute  3. There is a small focal area of late gadolinium enhancement at basal anteroseptum (at the point of the superior RV attachment) that might represent RV volume overload or a focal infiltrative disease such as sarcoidosis. This wouldn't explain the degree of LV dysfunction.  4. Mild mitral regurgitation. Mild tricuspid regurgitation.  5. Normal size pulmonary artery 24 mm, normal size aortic root 28 mm.  6. Normal bi-atrial size.  IMPRESSION: 1. Mildly dilated left ventricle with normal wall thickness and moderately to  severely impaired systolic function (LVEF 04%). Diffuse hypokinesis with paradoxical septal motion consistent with LBBB.  2. A small focal area of late gadolinium enhancement at basal anteroseptum that might represent RV volume overload or a focal infiltrative disease such as sarcoidosis, but certainly wouldn't explain the degree of of LV dysfunction.  These findings are consistent with non-ischemic dilated cardiomyopathy.  3. Normal right ventricular size and function (RVEF 61%).  4. Mild mitral and tricuspid regurgitation.  ASSESSMENT AND PLAN: 1.  Chronic systolic heart failure, NYHA Functional Class 2. Overall appears stable. BP does not allow for medication increase. Dyspnea is difficult to sort out in setting of chronic asthma, but no signs of volume overload and BNP has been normal. No change in medical therapy today.  2. S/P ICD. Followed by [name].  3. HTN - BP in low range, ideal control in patient with significant LV systolic dysfunction.    Current medicines are reviewed with the patient today.  The patient does not have concerns regarding medicines.  The following changes have been made:  no change  Labs/ tests ordered today include:   Orders Placed This Encounter  Procedures  . Basic metabolic panel  . B Nat Peptide    Disposition:   [facility] Richardson Dopp 3 months, me 6 months.  Signed, Sherren Mocha, MD  09/17/2014 11:44 AM    [address], [place], [place]  [address] Phone: [phone]; Fax: [phone]

## 2014-09-17 NOTE — Patient Instructions (Signed)
Your physician recommends that you schedule a follow-up appointment in: 3 months with Richardson Dopp, PA.   Your physician recommends that you return for lab work in: 3 months (BMET, BNP).  Your physician recommends that you continue on your current medications as directed. Please refer to the Current Medication list given to you today.  Your physician wants you to follow-up in: 6 months with Dr. Burt Knack.  You will receive a reminder letter in the mail two months in advance. If you don't receive a letter, please call our office to schedule the follow-up appointment.

## 2014-09-18 ENCOUNTER — Encounter: Payer: Self-pay | Admitting: Family Medicine

## 2014-09-18 ENCOUNTER — Ambulatory Visit (INDEPENDENT_AMBULATORY_CARE_PROVIDER_SITE_OTHER): Payer: Medicare Other | Admitting: Family Medicine

## 2014-09-18 ENCOUNTER — Encounter: Payer: Self-pay | Admitting: Cardiology

## 2014-09-18 VITALS — BP 138/74 | HR 77 | Temp 98.6°F | Wt 138.0 lb

## 2014-09-18 DIAGNOSIS — E118 Type 2 diabetes mellitus with unspecified complications: Secondary | ICD-10-CM

## 2014-09-18 DIAGNOSIS — G20 Parkinson's disease: Secondary | ICD-10-CM | POA: Diagnosis not present

## 2014-09-18 LAB — POCT GLYCOSYLATED HEMOGLOBIN (HGB A1C): Hemoglobin A1C: 6.4

## 2014-09-18 NOTE — Patient Instructions (Signed)
We do not need to make any changes to your medications today.  I have referred you to a neurologist in town and we will call you when an appointment has been made.  Please come back and see me in 6 months or sooner if you have any issues.

## 2014-09-18 NOTE — Assessment & Plan Note (Signed)
Well controlled, A1c 6.4 today - continue metformin 1000mg  bid - f/u in 6 months

## 2014-09-18 NOTE — Progress Notes (Signed)
   Subjective:    Patient ID: Veronica Shaw, female    DOB: [birth year], 64 y.o.   MRN: [id number]  HPI CHRONIC DIABETES  Disease Monitoring  Blood Sugar Ranges: 120-140 in am  Polyuria: no   Visual problems: no   Medication Compliance: yes  Medication Side Effects  Hypoglycemia: no   Preventitive Health Care  Eye Exam: 4/15  Foot Exam: 10/15  Diet pattern: well balanced, lowfat  Exercise: minimal   Review of Systems See HPI    Objective:   Physical Exam  Constitutional: She is oriented to person, place, and time. She appears well-developed and well-nourished. No distress.  HENT:  Head: Normocephalic and atraumatic.  Eyes: Conjunctivae are normal. Right eye exhibits no discharge. Left eye exhibits no discharge. No scleral icterus.  Cardiovascular: Normal rate.   Pulmonary/Chest: Effort normal. No respiratory distress.  Abdominal: She exhibits no distension.  Neurological: She is alert and oriented to person, place, and time.  Resting tremor  Skin: Skin is warm and dry. No rash noted. She is not diaphoretic.  Psychiatric: She has a normal mood and affect. Her behavior is normal.  Nursing note and vitals reviewed.         Assessment & Plan:

## 2014-09-18 NOTE — Assessment & Plan Note (Signed)
Previously followed by neuro in [name], has not seen in a long time, would like to transfer care to [facility] - refer to neuro in town - handicap placard form filled out today

## 2014-09-19 ENCOUNTER — Encounter: Payer: Self-pay | Admitting: Family Medicine

## 2014-09-19 NOTE — Progress Notes (Signed)
Placed forms in MD's box. Randee Huston Kennon Holter, CMA

## 2014-09-19 NOTE — Progress Notes (Signed)
Form filled out and placed in Veronica Shaw's box. 

## 2014-09-19 NOTE — Progress Notes (Signed)
Pt needs a form filled out for handicap parking placard. Said that the form she had filled at her last DOS was not the appropriate form. Please call pt asap when ready for pu @ [phone] / Thanks SR

## 2014-09-22 NOTE — Progress Notes (Signed)
Left voice message informing pt that form is ready for pick up.  Derl Barrow, RN

## 2014-09-23 ENCOUNTER — Other Ambulatory Visit: Payer: Self-pay | Admitting: Family Medicine

## 2014-09-23 ENCOUNTER — Encounter: Payer: Self-pay | Admitting: Internal Medicine

## 2014-09-23 DIAGNOSIS — Z1231 Encounter for screening mammogram for malignant neoplasm of breast: Secondary | ICD-10-CM

## 2014-09-24 ENCOUNTER — Ambulatory Visit: Payer: Medicare Other | Admitting: Family Medicine

## 2014-10-02 ENCOUNTER — Ambulatory Visit: Payer: Self-pay | Admitting: Neurology

## 2014-10-14 ENCOUNTER — Emergency Department (INDEPENDENT_AMBULATORY_CARE_PROVIDER_SITE_OTHER): Payer: Medicare Other

## 2014-10-14 ENCOUNTER — Encounter (HOSPITAL_COMMUNITY): Payer: Self-pay | Admitting: *Deleted

## 2014-10-14 ENCOUNTER — Emergency Department (INDEPENDENT_AMBULATORY_CARE_PROVIDER_SITE_OTHER)
Admission: EM | Admit: 2014-10-14 | Discharge: 2014-10-14 | Disposition: A | Discharge status: Home / Self Care | Payer: Medicare Other | Source: Home / Self Care | Attending: Emergency Medicine | Admitting: Emergency Medicine

## 2014-10-14 DIAGNOSIS — K219 Gastro-esophageal reflux disease without esophagitis: Secondary | ICD-10-CM

## 2014-10-14 DIAGNOSIS — G4730 Sleep apnea, unspecified: Secondary | ICD-10-CM

## 2014-10-14 DIAGNOSIS — R0602 Shortness of breath: Secondary | ICD-10-CM | POA: Diagnosis not present

## 2014-10-14 DIAGNOSIS — J45909 Unspecified asthma, uncomplicated: Secondary | ICD-10-CM | POA: Diagnosis not present

## 2014-10-14 MED ORDER — ALBUTEROL SULFATE (2.5 MG/3ML) 0.083% IN NEBU
INHALATION_SOLUTION | RESPIRATORY_TRACT | Status: AC
  Filled 2014-10-14: qty 6

## 2014-10-14 MED ORDER — ALBUTEROL SULFATE (2.5 MG/3ML) 0.083% IN NEBU
5.0000 mg | INHALATION_SOLUTION | Freq: Once | RESPIRATORY_TRACT | Status: AC
  Administered 2014-10-14: 5 mg via RESPIRATORY_TRACT

## 2014-10-14 MED ORDER — PANTOPRAZOLE SODIUM 40 MG PO TBEC: 40.0000 mg | DELAYED_RELEASE_TABLET | Freq: Every day | ORAL | Status: DC

## 2014-10-14 MED ORDER — GI COCKTAIL ~~LOC~~
30.0000 mL | Freq: Once | ORAL | Status: AC
  Administered 2014-10-14: 30 mL via ORAL

## 2014-10-14 MED ORDER — SUCRALFATE 1 G PO TABS: 1.0000 g | ORAL_TABLET | Freq: Three times a day (TID) | ORAL | Status: DC

## 2014-10-14 MED ORDER — GI COCKTAIL ~~LOC~~
ORAL | Status: AC
  Filled 2014-10-14: qty 30

## 2014-10-14 NOTE — ED Notes (Signed)
C/o something in her chest and throat burning and SOB x 4 days.  SOB especially at night. Has asthma since she was little but this different because of the burning in her chest.  No chills or fever.  Has prod cough of white foam.  Has a wheezing in her throat.  Has been using her nebulizer without relief.  LD 1400.

## 2014-10-14 NOTE — ED Provider Notes (Signed)
CSN: [id number]     Arrival date & time 10/14/14  1745 History   First MD Initiated Contact with Patient 10/14/14 1817     Chief Complaint  Patient presents with  . Chest Pain  . Shortness of Breath   (Consider location/radiation/quality/duration/timing/severity/associated sxs/prior Treatment) HPI  She is a 64 year old woman here for evaluation of chest pain and shortness of breath. She has a history of nonischemic cardiomyopathy with an EF of 35%. She has an ICD in place. She states for the last 4 days she has had burning chest pain, wheezing in her throat, cough, shortness of breath. All of the symptoms get worse when she lays down. They are not associated with exertion. She denies any lower extremity swelling. No fevers or chills. No sore throat, but she states she she has been getting small ulcers in her mouth and throat.  She states she has a history of what sounds like erosive esophagitis, but states that was treated over a year ago.  Past Medical History  Diagnosis Date  . Sleep apnea     No CPAP  . Gastritis   . Plantar fasciitis   . Diabetes mellitus   . Hyperlipidemia   . Asthma   . Urticaria   . Parkinson disease   . CHF (congestive heart failure)    Past Surgical History  Procedure Laterality Date  . Tubal ligation    . Cholecystectomy    . Bunionectomy    . Implantable cardioverter defibrillator implant  11-25-13    MDT dual chamber ICD implanted by [name] for primary prevention  . Left and right heart catheterization with coronary angiogram N/A 05/31/2013    Procedure: LEFT AND RIGHT HEART CATHETERIZATION WITH CORONARY ANGIOGRAM;  Surgeon: [name], MD;  Location: [facility] CATH LAB;  Service: Cardiovascular;  Laterality: N/A;  . Implantable cardioverter defibrillator implant N/A 11/25/2013    Procedure: IMPLANTABLE CARDIOVERTER DEFIBRILLATOR IMPLANT;  Surgeon: [name], MD;  Location: [facility] CATH LAB;  Service: Cardiovascular;  Laterality: N/A;   Family History   Problem Relation Age of Onset  . Coronary artery disease Father     Died age 98  . Heart attack Father   . Diabetes Father   . Coronary artery disease Mother     Died age 103  . Heart attack Mother   . Diabetes Mother    History  Substance Use Topics  . Smoking status: Never Smoker   . Smokeless tobacco: Not on file     Comment: exposed to smoke during child hood (parents)  . Alcohol Use: No   OB History    No data available     Review of Systems  Constitutional: Negative for fever and chills.  HENT: Positive for mouth sores. Negative for congestion and sore throat.   Respiratory: Positive for cough, shortness of breath and wheezing.   Cardiovascular: Positive for chest pain. Negative for leg swelling.  Gastrointestinal: Negative for nausea and vomiting.    Allergies  Aspirin; Penicillins; Prempro; Ace inhibitors; and Simvastatin  Home Medications   Prior to Admission medications   Medication Sig Start Date End Date Taking? Authorizing Provider  albuterol (PROVENTIL HFA;VENTOLIN HFA) 108 (90 BASE) MCG/ACT inhaler Inhale 2 puffs into the lungs every 6 (six) hours as needed for wheezing. 10/18/13  Yes Kandis Nab, MD  albuterol (PROVENTIL) (2.5 MG/3ML) 0.083% nebulizer solution Take 3 mLs (2.5 mg total) by nebulization 2 (two) times daily. Dx 493.9 03/06/14  Yes Frazier Richards,  MD  beclomethasone (QVAR) 80 MCG/ACT inhaler Inhale 2 puffs into the lungs 2 (two) times daily. Dx 493.20 02/28/14  Yes Frazier Richards, MD  cyclobenzaprine (FLEXERIL) 5 MG tablet Take 1 tablet (5 mg total) by mouth 3 (three) times daily as needed for muscle spasms. 10/02/13  Yes Kandis Nab, MD  furosemide (LASIX) 40 MG tablet  06/30/14  Yes Historical Provider, MD  gabapentin (NEURONTIN) 100 MG capsule Take 1-2 capsules (100-200 mg total) by mouth at bedtime as needed (for muscle pain). 04/17/14  Yes Frazier Richards, MD  ipratropium (ATROVENT) 0.06 % nasal spray  06/11/14  Yes Historical Provider, MD    meloxicam (MOBIC) 7.5 MG tablet Take 7.5 mg by mouth daily as needed for pain.   Yes Historical Provider, MD  metFORMIN (GLUCOPHAGE) 500 MG tablet TAKE TWO TABLETS BY MOUTH TWICE DAILY WITH A MEAL 07/08/14  Yes Frazier Richards, MD  metoprolol succinate (TOPROL-XL) 50 MG 24 hr tablet Take 1 tablet (50 mg total) by mouth daily. 07/08/14  Yes Frazier Richards, MD  metroNIDAZOLE (METROGEL) 1 % gel Apply 1 application topically daily. Apply to rosacea   Yes Historical Provider, MD  potassium chloride (K-DUR) 10 MEQ tablet Take 1 tablet (10 mEq total) by mouth daily. 07/28/14  Yes Darlin Coco, MD  ranitidine (ZANTAC) 150 MG tablet Take 1 tablet (150 mg total) by mouth daily. 04/10/14  Yes Frazier Richards, MD  sertraline (ZOLOFT) 100 MG tablet Take 1 tablet (100 mg total) by mouth at bedtime. 07/08/14  Yes Frazier Richards, MD  spironolactone (ALDACTONE) 25 MG tablet Take 1 tablet (25 mg total) by mouth daily. 05/20/14  Yes Frazier Richards, MD  valsartan (DIOVAN) 80 MG tablet Take 1 tablet (80 mg total) by mouth daily. 08/28/14  Yes Patrecia Pour, MD  carbidopa-levodopa (SINEMET) 25-100 MG per tablet Take 1 tablet by mouth 3 (three) times daily. For parkinsons 05/01/13 05/19/14  Historical Provider, MD  EPINEPHrine (EPIPEN) 0.3 mg/0.3 mL SOAJ injection Inject 0.3 mg into the muscle as needed (allergic reaction).    Historical Provider, MD  nitroGLYCERIN (NITROSTAT) 0.4 MG SL tablet Place 1 tablet (0.4 mg total) under the tongue every 5 (five) minutes as needed for chest pain. Take one tablet and place under tongue if develop chest pain may repeat x2 09/18/12   Minus Breeding, MD  pantoprazole (PROTONIX) 40 MG tablet Take 1 tablet (40 mg total) by mouth daily. 10/14/14   Melony Overly, MD  PRESCRIPTION MEDICATION Take 1 application by mouth daily as needed (for rosacea). Cream from medication assistance program    Historical Provider, MD  sucralfate (CARAFATE) 1 G tablet Take 1 tablet (1 g total) by mouth 4 (four) times  daily -  with meals and at bedtime. 10/14/14   Melony Overly, MD   BP 148/74 mmHg  Pulse 115  Temp(Src) 99.1 F (37.3 C) (Oral)  Resp 24  SpO2 96% Physical Exam  Constitutional: She is oriented to person, place, and time. She appears well-developed and well-nourished. No distress.  HENT:  Mouth/Throat: Oropharynx is clear and moist.    Neck: Neck supple.  Cardiovascular: S1 normal and S2 normal.  Tachycardia present.  Exam reveals no gallop.   No murmur heard. Pulmonary/Chest: She is in respiratory distress (Mild tachypnea). She has wheezes (upper airway). She has no rales. She exhibits no tenderness.  Neurological: She is alert and oriented to person, place, and time.    ED Course  Procedures (including critical care time) ED ECG REPORT   Date: 10/14/2014  Rate: 107   Rhythm: sinus tachycardia  QRS Axis: normal  Intervals: normal  ST/T Wave abnormalities: indeterminate  Conduction Disutrbances:left bundle branch block  Narrative Interpretation:   Old EKG Reviewed: unchanged  I have personally reviewed the EKG tracing and agree with the computerized printout as noted.  Labs Review Labs Reviewed - No data to display  Imaging Review Dg Chest 2 View  10/14/2014   CLINICAL DATA:  Shortness of breath for 4 days. History of asthma. Burning in the chest. History of bronchitis and pneumonia.  EXAM: CHEST  2 VIEW  COMPARISON:  04/22/2014  FINDINGS: Left-sided pacemaker leads overlie the right atrium and right ventricle. The heart is upper limits normal in size. Mildly prominent interstitial markings increased over prior studies. No focal consolidations or pleural effusions. No pulmonary edema.  Mid thoracic spondylosis. Surgical clips in the right upper quadrant of the abdomen.  IMPRESSION: 1. Stable mild cardiomegaly. 2. Mildly prominent interstitial markings have increased, raising the question of viral pneumonitis.   Electronically Signed   By: Nolon Nations M.D.   On: 10/14/2014  19:36     MDM   1. Gastroesophageal reflux disease, esophagitis presence not specified    Albuterol 5 mg nebulizer given. She reports improvement in the wheeze after the albuterol. GI cocktail given. She had complete resolution of the chest pain following the GI cocktail.  Her symptoms are likely secondary to reflux. Will add protonic and Carafate to her Zantac. Follow-up with GI in the next week or so. If symptoms change or worsen she should go to the emergency room.   Melony Overly, MD 10/14/14 [id number]

## 2014-10-14 NOTE — Discharge Instructions (Signed)
You are likely having reflux of stomach acid into your esophagus and throat. Continue your Zantac. Start Protonix daily. Start Carafate 4 times a day. Please call the GI doctor to set up an appointment in the next few weeks.  If you need a referral, please contact our office. Follow-up as needed.

## 2014-10-15 ENCOUNTER — Encounter: Payer: Self-pay | Admitting: Internal Medicine

## 2014-10-17 ENCOUNTER — Ambulatory Visit: Payer: Medicare Other | Admitting: Family Medicine

## 2014-10-20 ENCOUNTER — Telehealth: Payer: Self-pay | Admitting: Cardiovascular Disease

## 2014-10-20 NOTE — Telephone Encounter (Signed)
I spoke with the pt and can hear her wheezing on the phone.  The pt was seen at the Urgent Care last week for same symptoms.  The pt is concerned because she is not improving and she is using her nebulizer 6 times a day to help her breath.  The pt denies fever at this time. The pt does notice discomfort in her chest with inspiration.  At this time the pt has not contacted her PCP.  I advised the pt that she needs to call PCP to see if she can be seen today.  The pt agreed with plan and if she cannot be seen by PCP then she will go to Urgent Care or ER today for further evaluation.

## 2014-10-20 NOTE — Telephone Encounter (Signed)
New Message  Pt calling about  Pt c/o Shortness Of Breath: STAT if SOB developed within the last 24 hours or pt is noticeably SOB on the phone  1. Are you currently SOB (can you hear that pt is SOB on the phone)? yes 2. How long have you been experiencing SOB? 12-13 days  3. Are you SOB when sitting or when up moving around? All the time 4.  Are you currently experiencing any other symptoms? Constant Burning in chest, constant headache

## 2014-10-21 ENCOUNTER — Ambulatory Visit (INDEPENDENT_AMBULATORY_CARE_PROVIDER_SITE_OTHER): Payer: Medicare Other | Admitting: Family Medicine

## 2014-10-21 ENCOUNTER — Encounter: Payer: Self-pay | Admitting: Family Medicine

## 2014-10-21 VITALS — BP 136/70 | HR 96 | Temp 98.4°F | Wt 137.0 lb

## 2014-10-21 DIAGNOSIS — K219 Gastro-esophageal reflux disease without esophagitis: Secondary | ICD-10-CM | POA: Diagnosis not present

## 2014-10-21 NOTE — Patient Instructions (Signed)
Ranitidine tablets or capsules What is this medicine? RANITIDINE (ra NYE te deen) is a type of antihistamine that blocks the release of stomach acid. It is used to treat stomach or intestinal ulcers. It can relieve ulcer pain and discomfort, and the heartburn from acid reflux. This medicine may be used for other purposes; ask your health care provider or pharmacist if you have questions. COMMON BRAND NAME(S): Acid Reducer, Taladine, Zantac, Zantac 150, Zantac 75 What should I tell my health care provider before I take this medicine? They need to know if you have any of these conditions: -kidney disease -liver disease -porphyria -an unusual or allergic reaction to ranitidine, other medicines, foods, dyes, or preservatives -pregnant or trying to get pregnant -breast-feeding How should I use this medicine? Take this medicine by mouth with a glass of water. Follow the directions on the prescription label. If you only take this medicine once a day, take it at bedtime. Take your medicine at regular intervals. Do not take your medicine more often than directed. Do not stop taking except on your doctor's advice. Talk to your pediatrician regarding the use of this medicine in children. Special care may be needed. Overdosage: If you think you have taken too much of this medicine contact a poison control center or emergency room at once. NOTE: This medicine is only for you. Do not share this medicine with others. What if I miss a dose? If you miss a dose, take it as soon as you can. If it is almost time for your next dose, take only that dose. Do not take double or extra doses. What may interact with this medicine? -atazanavir -delavirdine -gefitinib -glipizide -ketoconazole -midazolam -procainamide -propantheline -triazolam -warfarin This list may not describe all possible interactions. Give your health care provider a list of all the medicines, herbs, non-prescription drugs, or dietary  supplements you use. Also tell them if you smoke, drink alcohol, or use illegal drugs. Some items may interact with your medicine. What should I watch for while using this medicine? Tell your doctor or health care professional if your condition does not start to get better or gets worse. You may need to take this medicine for several days as prescribed before your symptoms get better. Finish the full course of tablets prescribed, even if you feel better. Do not smoke cigarettes or drink alcohol. These increase irritation in your stomach and can lengthen the time it will take for ulcers to heal. Cigarettes and alcohol can also make acid reflux or heartburn worse. If you get black, tarry stools or vomit up what looks like coffee grounds, call your doctor or health care professional at once. You may have a bleeding ulcer. What side effects may I notice from receiving this medicine? Side effects that you should report to your doctor or health care professional as soon as possible: -agitation, nervousness, depression, hallucinations -allergic reactions like skin rash, itching or hives, swelling of the face, lips, or tongue -breast enlargement in both males and females -breathing problems -redness, blistering, peeling or loosening of the skin, including inside the mouth -unusual bleeding or bruising -unusually weak or tired -vomiting -yellowing of the skin or eyes Side effects that usually do not require medical attention (report to your doctor or health care professional if they continue or are bothersome): -constipation or diarrhea -dizziness -headache -nausea This list may not describe all possible side effects. Call your doctor for medical advice about side effects. You may report side effects to FDA at 1-800-FDA-1088.  Where should I keep my medicine? Keep out of the reach of children. Store at room temperature between 15 and 30 degrees C (59 and 86 degrees F). Protect from light and moisture.  Keep container tightly closed. Throw away any unused medicine after the expiration date. NOTE: This sheet is a summary. It may not cover all possible information. If you have questions about this medicine, talk to your doctor, pharmacist, or health care provider.  2015, Elsevier/Gold Standard. (2012-11-07 14:50:34)

## 2014-10-21 NOTE — Progress Notes (Signed)
  Subjective:     Cartina Brousseau is an 64 y.o. female who presents for evaluation of heartburn. This has been associated with cough, dysphagia, heartburn and wheezing. She denies chest pain, melena and midespigastric pain. Symptoms have been present for 7 days. She has had dysphagia for solids. She has not lost weight. She denies melena, hematochezia, hematemesis, and coffee ground emesis. Medical therapy in the past has included: H2 antagonists.  The following portions of the patient's history were reviewed and updated as appropriate: allergies, current medications, past family history, past medical history, past social history, past surgical history and problem list.  Review of Systems Pertinent items are noted in HPI.   Objective:     BP 136/70 mmHg  Pulse 96  Temp(Src) 98.4 F (36.9 C) (Oral)  Wt 137 lb (62.143 kg)  SpO2 93% Lungs: clear to auscultation bilaterally Heart: regular rate and rhythm, S1, S2 normal, no murmur, click, rub or gallop Abdomen: soft, non-tender; bowel sounds normal; no masses,  no organomegaly   Assessment:    Gastroesophageal Reflux Disease,  New onset     Plan:    Nonpharmacologic treatments were discussed including: eating smaller meals, elevation of the head of bed at night, avoidance of caffeine, chocolate, nicotine and peppermint, and avoiding tight fitting clothing. Zantac 75 mg BID   F/U with GI (appointment on Monday the 28th of March)

## 2014-10-27 ENCOUNTER — Ambulatory Visit (HOSPITAL_COMMUNITY)
Admission: RE | Admit: 2014-10-27 | Discharge: 2014-10-27 | Disposition: A | Discharge status: Home / Self Care | Payer: Medicare Other | Source: Ambulatory Visit | Attending: Internal Medicine | Admitting: Internal Medicine

## 2014-10-27 ENCOUNTER — Other Ambulatory Visit: Payer: Self-pay | Admitting: Physician Assistant

## 2014-10-27 DIAGNOSIS — R1310 Dysphagia, unspecified: Secondary | ICD-10-CM

## 2014-10-27 DIAGNOSIS — K219 Gastro-esophageal reflux disease without esophagitis: Secondary | ICD-10-CM | POA: Diagnosis not present

## 2014-10-27 DIAGNOSIS — Z1231 Encounter for screening mammogram for malignant neoplasm of breast: Secondary | ICD-10-CM | POA: Diagnosis not present

## 2014-10-27 DIAGNOSIS — R1013 Epigastric pain: Secondary | ICD-10-CM | POA: Diagnosis not present

## 2014-10-30 ENCOUNTER — Ambulatory Visit
Admission: RE | Admit: 2014-10-30 | Discharge: 2014-10-30 | Disposition: A | Discharge status: Home / Self Care | Payer: Medicare Other | Source: Ambulatory Visit | Attending: Physician Assistant | Admitting: Physician Assistant

## 2014-10-30 DIAGNOSIS — R1310 Dysphagia, unspecified: Secondary | ICD-10-CM

## 2014-11-10 DIAGNOSIS — K219 Gastro-esophageal reflux disease without esophagitis: Secondary | ICD-10-CM | POA: Diagnosis not present

## 2014-11-10 DIAGNOSIS — Z1211 Encounter for screening for malignant neoplasm of colon: Secondary | ICD-10-CM | POA: Diagnosis not present

## 2014-11-14 ENCOUNTER — Ambulatory Visit: Payer: Self-pay | Admitting: Neurology

## 2014-11-28 ENCOUNTER — Encounter: Payer: Self-pay | Admitting: *Deleted

## 2014-12-01 ENCOUNTER — Encounter: Payer: Medicare Other | Admitting: Internal Medicine

## 2014-12-01 NOTE — Progress Notes (Signed)
This encounter was created in error - please disregard.

## 2014-12-09 ENCOUNTER — Other Ambulatory Visit (INDEPENDENT_AMBULATORY_CARE_PROVIDER_SITE_OTHER): Payer: Medicare Other | Admitting: *Deleted

## 2014-12-09 DIAGNOSIS — I428 Other cardiomyopathies: Secondary | ICD-10-CM

## 2014-12-09 DIAGNOSIS — I10 Essential (primary) hypertension: Secondary | ICD-10-CM | POA: Diagnosis not present

## 2014-12-09 DIAGNOSIS — I429 Cardiomyopathy, unspecified: Secondary | ICD-10-CM | POA: Diagnosis not present

## 2014-12-09 DIAGNOSIS — I5022 Chronic systolic (congestive) heart failure: Secondary | ICD-10-CM | POA: Diagnosis not present

## 2014-12-09 LAB — BASIC METABOLIC PANEL
BUN: 21 mg/dL (ref 6–23)
CO2: 28 mEq/L (ref 19–32)
Calcium: 10 mg/dL (ref 8.4–10.5)
Chloride: 106 mEq/L (ref 96–112)
Creatinine, Ser: 0.94 mg/dL (ref 0.40–1.20)
GFR: 63.35 mL/min (ref >60.00)
Glucose, Bld: 138 mg/dL — ABNORMAL HIGH (ref 70–99)
Potassium: 4.1 mEq/L (ref 3.5–5.1)
Sodium: 139 mEq/L (ref 135–145)

## 2014-12-09 LAB — BRAIN NATRIURETIC PEPTIDE: Pro B Natriuretic peptide (BNP): 52 pg/mL (ref 0.0–100.0)

## 2014-12-10 ENCOUNTER — Ambulatory Visit (INDEPENDENT_AMBULATORY_CARE_PROVIDER_SITE_OTHER): Payer: Medicare Other | Admitting: *Deleted

## 2014-12-10 ENCOUNTER — Encounter: Payer: Self-pay | Admitting: Internal Medicine

## 2014-12-10 DIAGNOSIS — I429 Cardiomyopathy, unspecified: Secondary | ICD-10-CM | POA: Diagnosis not present

## 2014-12-10 DIAGNOSIS — I428 Other cardiomyopathies: Secondary | ICD-10-CM

## 2014-12-10 NOTE — Progress Notes (Signed)
Remote ICD transmission.   

## 2014-12-11 LAB — CUP PACEART REMOTE DEVICE CHECK
Battery Remaining Longevity: 127 mo
Battery Voltage: 3.01 V
Brady Statistic AP VP Percent: 0 %
Brady Statistic AP VS Percent: 0.23 %
Brady Statistic AS VP Percent: 0.04 %
Brady Statistic AS VS Percent: 99.73 %
Brady Statistic RA Percent Paced: 0.24 %
Brady Statistic RV Percent Paced: 0.04 %
Date Time Interrogation Session: 20160511062603
HighPow Impedance: 82 Ohm
Lead Channel Impedance Value: 399 Ohm
Lead Channel Impedance Value: 4047 Ohm
Lead Channel Impedance Value: 4047 Ohm
Lead Channel Impedance Value: 4047 Ohm
Lead Channel Impedance Value: 646 Ohm
Lead Channel Impedance Value: 817 Ohm
Lead Channel Pacing Threshold Amplitude: 0.5 V
Lead Channel Pacing Threshold Amplitude: 0.5 V
Lead Channel Pacing Threshold Pulse Width: 0.4 ms
Lead Channel Pacing Threshold Pulse Width: 0.4 ms
Lead Channel Sensing Intrinsic Amplitude: 1.5 mV
Lead Channel Sensing Intrinsic Amplitude: 1.5 mV
Lead Channel Sensing Intrinsic Amplitude: 20.125 mV
Lead Channel Sensing Intrinsic Amplitude: 20.125 mV
Lead Channel Setting Pacing Amplitude: 2 V
Lead Channel Setting Pacing Amplitude: 2.5 V
Lead Channel Setting Pacing Pulse Width: 0.4 ms
Lead Channel Setting Sensing Sensitivity: 0.3 mV
Zone Setting Detection Interval: 300 ms
Zone Setting Detection Interval: 350 ms
Zone Setting Detection Interval: 360 ms
Zone Setting Detection Interval: 360 ms

## 2014-12-16 ENCOUNTER — Ambulatory Visit: Payer: Medicare Other | Admitting: Physician Assistant

## 2014-12-16 NOTE — Progress Notes (Signed)
Cardiology Office Note   Date:  12/17/2014   ID:  Veronica Shaw, DOB [birth year], MRN [id number]  PCP:  Beverlyn Roux, MD  Cardiologist:  Dr. Sherren Mocha   Electrophysiologist:  Dr. Cristopher Peru    Chief Complaint  Patient presents with  . Congestive Heart Failure     History of Present Illness: Veronica Shaw is a 64 y.o. female with a hx of nonischemic cardiomyopathy, systolic CHF, s/p AICD 0/8657, diabetes, HL, sleep apnea, asthma.   Last seen by Dr. Burt Knack 09/2014. She returns for follow-up.  She denies chest pain or syncope. She does note dyspnea with exertion. This is fairly chronic. She is NYHA 3. She sleeps on an incline chronically. She awakens in the middle of the night with shortness of breath. This sounds like symptoms related to her asthma. She denies LE edema.   Studies:  - LHC (10/14):  Minor luminal irregularity in prox LAD, EF 35%  - Echo (2/15):  EF 30-35%, diff HK, ant-sept AK, Gr 2 DD, mild MR, trivial TR  - Nuclear (5/13):  Normal stress nuclear study. LV Ejection Fraction: 58%  - Cardiac MRI (4/15):  1. Mild LVE, EF 34%, Diff HK, paradoxical septal motion c/w LBBB, small focal area of late gadolinium enhancement at basal anteroseptum - may represent RV volume overload or a focal infiltrative disease such as sarcoidosis, but certainly wouldn't explain the degree of of LV dysfunction. - c/w NICM, normal RV size and function (RVEF 61%), Mild MR and TR   Recent Labs/Images:  04/23/2014: Hemoglobin 14.3; WBC 6.3 12/09/2014: BUN 21; Creatinine 0.94; Potassium 4.1; Pro B Natriuretic peptide (BNP) 52.0; Sodium 139    Wt Readings from Last 3 Encounters:  12/17/14 138 lb (62.596 kg)  10/21/14 137 lb (62.143 kg)  09/18/14 138 lb (62.596 kg)     Past Medical History  Diagnosis Date  . Sleep apnea     No CPAP  . Gastritis   . Plantar fasciitis   . Diabetes mellitus   . Hyperlipidemia   . Asthma   . Urticaria   . Parkinson disease   . CHF (congestive  heart failure)     Current Outpatient Prescriptions  Medication Sig Dispense Refill  . albuterol (PROVENTIL HFA;VENTOLIN HFA) 108 (90 BASE) MCG/ACT inhaler Inhale 2 puffs into the lungs every 6 (six) hours as needed for wheezing. 1 Inhaler 1  . albuterol (PROVENTIL) (2.5 MG/3ML) 0.083% nebulizer solution Take 3 mLs (2.5 mg total) by nebulization 2 (two) times daily. Dx 493.9 75 mL 2  . beclomethasone (QVAR) 80 MCG/ACT inhaler Inhale 2 puffs into the lungs 2 (two) times daily. Dx 493.20 1 Inhaler 12  . carbidopa-levodopa (SINEMET) 25-100 MG per tablet Take 1 tablet by mouth 3 (three) times daily. For parkinsons    . cyclobenzaprine (FLEXERIL) 5 MG tablet Take 1 tablet (5 mg total) by mouth 3 (three) times daily as needed for muscle spasms. 30 tablet 1  . EPINEPHrine (EPIPEN) 0.3 mg/0.3 mL SOAJ injection Inject 0.3 mg into the muscle as needed (allergic reaction).    . furosemide (LASIX) 40 MG tablet Take 20 mg by mouth daily.     [name] Kitchen ipratropium (ATROVENT) 0.06 % nasal spray Place 1 spray into both nostrils 2 (two) times daily.     . meloxicam (MOBIC) 7.5 MG tablet Take 7.5 mg by mouth daily as needed for pain.    . metFORMIN (GLUCOPHAGE) 500 MG tablet TAKE TWO TABLETS BY MOUTH TWICE DAILY WITH A MEAL 360  tablet 4  . metoprolol succinate (TOPROL-XL) 50 MG 24 hr tablet Take 1 tablet (50 mg total) by mouth daily. 90 tablet 4  . metroNIDAZOLE (METROGEL) 1 % gel Apply 1 application topically daily. Apply to rosacea    . nitroGLYCERIN (NITROSTAT) 0.4 MG SL tablet Place 1 tablet (0.4 mg total) under the tongue every 5 (five) minutes as needed for chest pain. Take one tablet and place under tongue if develop chest pain may repeat x2 25 tablet 3  . pantoprazole (PROTONIX) 40 MG tablet Take 1 tablet (40 mg total) by mouth daily. 30 tablet 0  . potassium chloride (K-DUR) 10 MEQ tablet Take 1 tablet (10 mEq total) by mouth daily. 60 tablet 6  . PRESCRIPTION MEDICATION Take 1 application by mouth daily as  needed (for rosacea). Cream from medication assistance program    . ranitidine (ZANTAC) 150 MG tablet Take 1 tablet (150 mg total) by mouth daily. 90 tablet 4  . spironolactone (ALDACTONE) 25 MG tablet Take 1 tablet (25 mg total) by mouth daily. 90 tablet 3  . sucralfate (CARAFATE) 1 G tablet Take 1 tablet (1 g total) by mouth 4 (four) times daily -  with meals and at bedtime. 120 tablet 0  . valsartan (DIOVAN) 80 MG tablet Take 1 tablet (80 mg total) by mouth daily. 90 tablet 3   No current facility-administered medications for this visit.     Allergies:   Aspirin; Penicillins; Prempro; Ace inhibitors; and Simvastatin   Social History:  The patient  reports that she has never smoked. She does not have any smokeless tobacco history on file. She reports that she does not drink alcohol or use illicit drugs.   Family History:  The patient's family history includes Coronary artery disease in her father and mother; Diabetes in her father and mother; Heart attack in her father and mother; Stroke in her mother.   ROS:  Please see the history of present illness.   Review of Systems  Cardiovascular: Positive for dyspnea on exertion.  Musculoskeletal: Positive for joint pain.  All other systems reviewed and are negative.   PHYSICAL EXAM: VS:  BP 140/70 mmHg  Pulse 82  Ht 5\' 2"  (1.575 m)  Wt 138 lb (62.596 kg)  BMI 25.23 kg/m2  SpO2 95% Well nourished, well developed, in no acute distress HEENT: normal Neck: no JVD Cardiac:  normal S1, S2; RRR; no murmurno S3; no LE edema  Lungs:  Decreased breath sounds bilaterally, no wheezing, no rhonchi, no rales GI: soft, nontender, no hepatomegaly Skin: warm and dry Neuro:  CNs 2-12 intact, no focal abnormalities noted Psych:  Normal affect   EKG:  EKG is not ordered today. It demonstrates:  N/a   ASSESSMENT AND PLAN:  1.  Chronic systolic heart failure:   Recent BNP was normal. Recent Optivol measurements normal. I suspect a lot of her  shortness of breath is related to asthma. She seems to feel better on the days that she takes Lasix. She can try to adjust her Lasix to 20 mg daily along with potassium 10 mEq daily. Check a basic metabolic panel in one week. 2.  NICM (nonischemic cardiomyopathy):  Continue beta blocker, ARB, spironolactone.  With her asthma, we may want to change her beta blocker to a more cardioselective agent. I would continue Toprol for now. We could also consider Ivabridine. With a normal BNP, I am not certain that Entresto would be of benefit. 3.  HYPERTENSION, BENIGN SYSTEMIC:   Fair  control. Adjust Lasix as noted. 4.  Asthma: Patient would like to see a specialist. Refer to pulmonology. 5.  ICD (implantable cardioverter-defibrillator), dual, in situ:  Follow up with EP as planned.    Medications were reviewed today. Any concerns or are as outlined above. The following changes were made:  Change Lasix to 20 mg QD  Change K+ to 10 mEq QD  Labs/studies ordered: Orders Placed This Encounter  Procedures  . Basic metabolic panel    Standing Status: Future     Number of Occurrences:      Standing Expiration Date: 12/17/2015  . Ambulatory referral to Pulmonology    Referral Priority:  Routine    Referral Type:  Consultation    Referral Reason:  Specialty Services Required    Referred to Provider:  Collene Gobble, MD    Requested Specialty:  Pulmonary Disease    Number of Visits Requested:  1    Disposition:   FU with Dr. Sherren Mocha as planned.   Signed, Versie Starks, MHS 12/17/2014 11:28 AM    [facility] [address], [place], [place]  [address] Phone: [phone]; Fax: [phone]

## 2014-12-17 ENCOUNTER — Encounter: Payer: Self-pay | Admitting: Physician Assistant

## 2014-12-17 ENCOUNTER — Ambulatory Visit (INDEPENDENT_AMBULATORY_CARE_PROVIDER_SITE_OTHER): Payer: Medicare Other | Admitting: Physician Assistant

## 2014-12-17 VITALS — BP 140/70 | HR 82 | Ht 62.0 in | Wt 138.0 lb

## 2014-12-17 DIAGNOSIS — I428 Other cardiomyopathies: Secondary | ICD-10-CM

## 2014-12-17 DIAGNOSIS — J45909 Unspecified asthma, uncomplicated: Secondary | ICD-10-CM

## 2014-12-17 DIAGNOSIS — Z95810 Presence of automatic (implantable) cardiac defibrillator: Secondary | ICD-10-CM

## 2014-12-17 DIAGNOSIS — I10 Essential (primary) hypertension: Secondary | ICD-10-CM | POA: Diagnosis not present

## 2014-12-17 DIAGNOSIS — I429 Cardiomyopathy, unspecified: Secondary | ICD-10-CM | POA: Diagnosis not present

## 2014-12-17 DIAGNOSIS — I5022 Chronic systolic (congestive) heart failure: Secondary | ICD-10-CM

## 2014-12-17 MED ORDER — NITROGLYCERIN 0.4 MG SL SUBL: 0.4000 mg | SUBLINGUAL_TABLET | SUBLINGUAL | Status: DC | PRN

## 2014-12-17 NOTE — Patient Instructions (Addendum)
Medication Instructions:   START TAKING LASIX 20 MG ONCE A DAY   SRAT TAKING POTASSIUM 10 MEQ  ONCE A DAY    Labwork:  BMET IN ONE WEEK NEEDS TO BE SCHEDULED AND LINKED   Testing/Procedures:   Follow-Up:  WITH DR Burt Knack IN [place]   Any Other Special Instructions Will Be Listed Below (If Applicable).  NEEDS REFERRAL TO PULMONARY FOR  ASTHMA

## 2014-12-19 ENCOUNTER — Encounter: Payer: Self-pay | Admitting: Cardiology

## 2014-12-22 ENCOUNTER — Encounter: Payer: Self-pay | Admitting: Family Medicine

## 2014-12-22 ENCOUNTER — Ambulatory Visit (INDEPENDENT_AMBULATORY_CARE_PROVIDER_SITE_OTHER): Payer: Medicare Other | Admitting: Family Medicine

## 2014-12-22 DIAGNOSIS — J441 Chronic obstructive pulmonary disease with (acute) exacerbation: Secondary | ICD-10-CM | POA: Diagnosis not present

## 2014-12-22 DIAGNOSIS — J410 Simple chronic bronchitis: Secondary | ICD-10-CM

## 2014-12-22 MED ORDER — PREDNISONE 50 MG PO TABS: 50.0000 mg | ORAL_TABLET | Freq: Every day | ORAL | Status: DC

## 2014-12-22 MED ORDER — DOXYCYCLINE HYCLATE 100 MG PO TABS: 100.0000 mg | ORAL_TABLET | Freq: Two times a day (BID) | ORAL | Status: DC

## 2014-12-22 MED ORDER — HYDROCODONE-HOMATROPINE 5-1.5 MG/5ML PO SYRP: 5.0000 mL | ORAL_SOLUTION | Freq: Three times a day (TID) | ORAL | Status: DC | PRN

## 2014-12-22 NOTE — Patient Instructions (Signed)

## 2014-12-22 NOTE — Progress Notes (Addendum)
Patient ID: Veronica Shaw, female   DOB: 05-03-1951, 64 y.o.   MRN: [id number]   HPI  Patient presents today for same-day appointment for cough and cold  Patient explains that over the last 5-7 days she's had persistent cough, shortness of breath, malaise, muscle pain and aches, congestion, chills, subjective fever, and loss of appetite.   She states that Friday, about 3 days ago she developed slight blood-tinged sputum which has persisted since that time. She is not a smoker but has been a passive smoker with her parents and 2 packs per day.  She denies history of hemoptysis and states that it's no more than a few blood flecks within the sputum.  Albuterol still helping her, she is just concerned as she is more than usual  Smoking status noted ROS: Per HPI  Objective: BP 125/71 mmHg  Pulse 98  Temp(Src) 98.5 F (36.9 C) (Oral)  Wt 137 lb (62.143 kg) Gen: NAD, alert, cooperative with exam HEENT: NCAT CV: RRR, good S1/S2, no murmur Resp: CTABL, no wheezes, non-labored Ext: No edema, warm Neuro: Alert and oriented, No gross deficits  Assessment and plan:  COPD (chronic obstructive pulmonary disease) COPD with current exacerbation Treat with doxycycline, prednisone hycodan for suppressant, aches, pains,and sleep  Following up with Pulm on 6/6 Discussed usual course, red flags for return, and blood tinged sputum in detail. Encouraged very low threshold for return if develops worsening blood and sputum or if does not resolve as expected.  Tinged sputum is from aggressive coughing throughout the week      Meds ordered this encounter  Medications  . predniSONE (DELTASONE) 50 MG tablet    Sig: Take 1 tablet (50 mg total) by mouth daily with breakfast.    Dispense:  5 tablet    Refill:  0  . doxycycline (VIBRA-TABS) 100 MG tablet    Sig: Take 1 tablet (100 mg total) by mouth 2 (two) times daily.    Dispense:  20 tablet    Refill:  0  . HYDROcodone-homatropine (HYCODAN)  5-1.5 MG/5ML syrup    Sig: Take 5 mLs by mouth every 8 (eight) hours as needed for cough.    Dispense:  120 mL    Refill:  0

## 2014-12-22 NOTE — Assessment & Plan Note (Signed)
COPD with current exacerbation Treat with doxycycline, prednisone hycodan for suppressant, aches, pains,and sleep  Following up with Pulm on 6/6 Discussed usual course, red flags for return, and blood tinged sputum in detail. Encouraged very low threshold for return if develops worsening blood and sputum or if does not resolve as expected.  Tinged sputum is from aggressive coughing throughout the week

## 2014-12-24 ENCOUNTER — Encounter: Payer: Self-pay | Admitting: Cardiovascular Disease

## 2014-12-24 ENCOUNTER — Other Ambulatory Visit (INDEPENDENT_AMBULATORY_CARE_PROVIDER_SITE_OTHER): Payer: Medicare Other | Admitting: *Deleted

## 2014-12-24 DIAGNOSIS — I5022 Chronic systolic (congestive) heart failure: Secondary | ICD-10-CM

## 2014-12-24 LAB — BASIC METABOLIC PANEL
BUN: 32 mg/dL — ABNORMAL HIGH (ref 6–23)
CO2: 26 mEq/L (ref 19–32)
Calcium: 9.7 mg/dL (ref 8.4–10.5)
Chloride: 104 mEq/L (ref 96–112)
Creatinine, Ser: 1.16 mg/dL (ref 0.40–1.20)
GFR: 49.7 mL/min — ABNORMAL LOW (ref >60.00)
Glucose, Bld: 126 mg/dL — ABNORMAL HIGH (ref 70–99)
Potassium: 3.8 mEq/L (ref 3.5–5.1)
Sodium: 138 mEq/L (ref 135–145)

## 2014-12-25 ENCOUNTER — Other Ambulatory Visit: Payer: Self-pay

## 2014-12-25 DIAGNOSIS — R899 Unspecified abnormal finding in specimens from other organs, systems and tissues: Secondary | ICD-10-CM

## 2015-01-01 ENCOUNTER — Other Ambulatory Visit (INDEPENDENT_AMBULATORY_CARE_PROVIDER_SITE_OTHER): Payer: Medicare Other | Admitting: *Deleted

## 2015-01-01 DIAGNOSIS — R899 Unspecified abnormal finding in specimens from other organs, systems and tissues: Secondary | ICD-10-CM | POA: Diagnosis not present

## 2015-01-01 LAB — BASIC METABOLIC PANEL
BUN: 16 mg/dL (ref 6–23)
CO2: 28 mEq/L (ref 19–32)
Calcium: 9.7 mg/dL (ref 8.4–10.5)
Chloride: 104 mEq/L (ref 96–112)
Creatinine, Ser: 0.97 mg/dL (ref 0.40–1.20)
GFR: 61.09 mL/min (ref >60.00)
Glucose, Bld: 136 mg/dL — ABNORMAL HIGH (ref 70–99)
Potassium: 4.2 mEq/L (ref 3.5–5.1)
Sodium: 136 mEq/L (ref 135–145)

## 2015-01-02 ENCOUNTER — Telehealth: Payer: Self-pay | Admitting: *Deleted

## 2015-01-02 NOTE — Telephone Encounter (Signed)
Pt notified of lab results with verbal understanding by phone today.

## 2015-01-05 ENCOUNTER — Ambulatory Visit (INDEPENDENT_AMBULATORY_CARE_PROVIDER_SITE_OTHER)
Admission: RE | Admit: 2015-01-05 | Discharge: 2015-01-05 | Disposition: A | Discharge status: Home / Self Care | Payer: Medicare Other | Source: Ambulatory Visit | Attending: Internal Medicine | Admitting: Internal Medicine

## 2015-01-05 ENCOUNTER — Encounter: Payer: Self-pay | Admitting: Internal Medicine

## 2015-01-05 ENCOUNTER — Ambulatory Visit (INDEPENDENT_AMBULATORY_CARE_PROVIDER_SITE_OTHER): Payer: Medicare Other | Admitting: Internal Medicine

## 2015-01-05 VITALS — BP 144/76 | HR 101 | Ht 62.0 in | Wt 137.0 lb

## 2015-01-05 DIAGNOSIS — J4550 Severe persistent asthma, uncomplicated: Secondary | ICD-10-CM

## 2015-01-05 DIAGNOSIS — R0602 Shortness of breath: Secondary | ICD-10-CM | POA: Diagnosis not present

## 2015-01-05 DIAGNOSIS — J441 Chronic obstructive pulmonary disease with (acute) exacerbation: Secondary | ICD-10-CM | POA: Diagnosis not present

## 2015-01-05 DIAGNOSIS — J449 Chronic obstructive pulmonary disease, unspecified: Secondary | ICD-10-CM | POA: Insufficient documentation

## 2015-01-05 MED ORDER — MOMETASONE FURO-FORMOTEROL FUM 200-5 MCG/ACT IN AERO: INHALATION_SPRAY | RESPIRATORY_TRACT | Status: DC

## 2015-01-05 NOTE — Patient Instructions (Addendum)
Start dulera 200 Take 2 puffs first thing in am and then another 2 puffs about 12 hours later.   Work on inhaler technique:  relax and gently blow all the way out then take a nice smooth deep breath back in, triggering the inhaler at same time you start breathing in.  Hold for up to 5 seconds if you can.  Rinse and gargle with water when done     Only use your albuterol (ventolin) as a rescue medication to be used if you can't catch your breath by resting or doing a relaxed purse lip breathing pattern.  - The less you use it, the better it will work when you need it. - Ok to use up to 2 puffs  every 4 hours if you must but call for immediate appointment if use goes up over your usual need - Don't leave home without it !!  (think of it like the spare tire for your car)   Only use your nebulizer if you try the ventolin first and it doesn't work  Continue Pantoprazole (protonix) 40 mg but be sure to  Take  30-60 min before first meal of the day and Zantac 150 mg one @  bedtime until return to office - this is the best way to tell whether stomach acid is contributing to your problem.    GERD (REFLUX)  is an extremely common cause of respiratory symptoms just like yours , many times with no obvious heartburn at all.    It can be treated with medication, but also with lifestyle changes including avoidance of late meals, elevation of the head of your bed (ideally with 6 inch  bed blocks) excessive alcohol, smoking cessation, and avoid fatty foods, chocolate, peppermint, colas, red wine, and acidic juices such as orange juice.  NO MINT OR MENTHOL PRODUCTS SO NO COUGH DROPS  USE SUGARLESS CANDY INSTEAD (Jolley ranchers or Stover's or Life Savers) or even ice chips will also do - the key is to swallow to prevent all throat clearing. NO OIL BASED VITAMINS - use powdered substitutes.  Please remember to go to the x-ray department downstairs for your tests - we will call you with the results when they are  available.    Please schedule a follow up office visit in 6 weeks, call sooner if needed with pfts on return

## 2015-01-05 NOTE — Progress Notes (Signed)
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 Advice Only    referred by Dr. Burt Knack for asthma.    used neb alb and ventolin but > 4  prior to OV  Last steroids > 3 weeks prior to OV  And worse since finished , esp at hs  With cough/ wheeze  No obvious   patterns in day to day or daytime variabilty or excess / purulent sputum h or cp or chest tightness, subjective wheeze overt sinus or hb symptoms. No unusual exp hx or h/o childhood pna  or knowledge of premature birth.   Also denies any obvious fluctuation of symptoms with weather or environmental changes or other aggravating or alleviating factors except as outlined above   Current Medications, Allergies, Complete Past Medical History, Past Surgical History, Family History, and Social History were reviewed in [employer] record.               Review of Systems  Constitutional: Negative for fever and unexpected weight change.  HENT: Positive for congestion and sore throat. Negative for dental problem, ear pain, nosebleeds, postnasal drip, rhinorrhea, sinus pressure, sneezing and trouble swallowing.   Eyes: Negative for redness and itching.  Respiratory: Positive for cough and shortness of breath. Negative for chest tightness and wheezing.   Cardiovascular: Negative for palpitations and leg swelling.  Gastrointestinal: Negative for nausea and vomiting.  Genitourinary: Negative for  dysuria.  Musculoskeletal: Negative for joint swelling.  Skin: Negative for rash.  Neurological: Negative for headaches.  Hematological: Does not bruise/bleed easily.  Psychiatric/Behavioral: Negative for dysphoric mood. The patient is not nervous/anxious.        Objective:   Physical Exam  amb bf  failed to answer a single question asked in a straightforward manner, tending to go off on tangents or answer questions with ambiguous medical terms or diagnoses and seemed aggravated  when asked the same question more than once for clarification.   Wt Readings from Last 3 Encounters:  01/05/15 137 lb (62.143 kg)  12/22/14 137 lb (62.143 kg)  12/17/14 138 lb (62.596 kg)    Vital signs reviewed  HEENT: nl dentition, turbinates, and orophanx. Nl external ear canals without cough reflex   NECK :  without JVD/Nodes/TM/ nl carotid upstrokes bilaterally   LUNGS: no acc muscle use, clear to A and P bilaterally without cough on insp or exp maneuvers   CV:  RRR  no s3 or murmur or increase in P2, no edema   ABD:  soft and nontender with nl excursion in the supine position. No bruits or organomegaly, bowel sounds nl  MS:  warm without deformities, calf tenderness, cyanosis or clubbing  SKIN: warm and dry without lesions    NEURO:  alert, approp, no deficits    CXR PA and Lateral:   01/05/2015 :  I personally reviewed images and agree with radiology impression as follows:     No active cardiopulmonary disease.      Assessment & Plan:

## 2015-01-07 ENCOUNTER — Encounter: Payer: Self-pay | Admitting: Internal Medicine

## 2015-01-07 ENCOUNTER — Other Ambulatory Visit: Payer: Self-pay | Admitting: *Deleted

## 2015-01-07 MED ORDER — POTASSIUM CHLORIDE ER 10 MEQ PO TBCR: 10.0000 meq | EXTENDED_RELEASE_TABLET | Freq: Every day | ORAL | Status: DC

## 2015-01-07 NOTE — Assessment & Plan Note (Addendum)
-   Spirometry 01/05/15  FEV1  0.98 (46%) ratio 62  - 01/05/2015 p extensive coaching HFA effectiveness =    50% > try dulera 200 2bid  This is not copd although she has been labeled with this in the past and I will remove it from her problem list   I had an extended discussion with the patient reviewing all relevant studies completed to date and  lasting  35 min on the following ongoing concerns:   1) clearly she has poorly controlled asthma x years and is at risk of remodeling for sure but note the fev1/fvc ratio is only mildly reduced indicating restrictive changes as well and since not significnantly hyperinflated on cxr this is not hyperinflation induced pseudorestriction but likely related to thoracic cage issues which are chronic and unlikely to change so no need to "normalize lung function here which is typcially the goal of asthma care  2) poor mdi noted, this will be a challenge and if not better on return consider BREO  3) rule of 2's reviewed  4) ? Acid (or non-acid) GERD playing a role with her symptoms> always difficult to exclude as up to 75% of pts in some series report no assoc GI/ Heartburn symptoms> rec max (24h)  acid suppression and diet restrictions/ reviewed and instructions given in writing.   5) Each maintenance medication was reviewed in detail including most importantly the difference between maintenance and as needed and under what circumstances the prns are to be used.  Please see instructions for details which were reviewed in writing and the patient given a copy.

## 2015-01-14 ENCOUNTER — Other Ambulatory Visit: Payer: Self-pay | Admitting: *Deleted

## 2015-01-14 DIAGNOSIS — J410 Simple chronic bronchitis: Secondary | ICD-10-CM

## 2015-01-14 MED ORDER — ALBUTEROL SULFATE HFA 108 (90 BASE) MCG/ACT IN AERS: 2.0000 | INHALATION_SPRAY | Freq: Four times a day (QID) | RESPIRATORY_TRACT | Status: DC | PRN

## 2015-01-19 ENCOUNTER — Other Ambulatory Visit: Payer: Self-pay | Admitting: *Deleted

## 2015-01-19 DIAGNOSIS — J410 Simple chronic bronchitis: Secondary | ICD-10-CM

## 2015-01-19 MED ORDER — ALBUTEROL SULFATE HFA 108 (90 BASE) MCG/ACT IN AERS: 2.0000 | INHALATION_SPRAY | Freq: Four times a day (QID) | RESPIRATORY_TRACT | Status: DC | PRN

## 2015-02-10 ENCOUNTER — Encounter: Payer: Self-pay | Admitting: Family Medicine

## 2015-02-10 ENCOUNTER — Ambulatory Visit (INDEPENDENT_AMBULATORY_CARE_PROVIDER_SITE_OTHER): Payer: Medicare Other | Admitting: Family Medicine

## 2015-02-10 VITALS — BP 133/52 | HR 71 | Temp 98.8°F | Ht 62.0 in | Wt 138.0 lb

## 2015-02-10 DIAGNOSIS — G20 Parkinson's disease: Secondary | ICD-10-CM | POA: Diagnosis not present

## 2015-02-10 DIAGNOSIS — E118 Type 2 diabetes mellitus with unspecified complications: Secondary | ICD-10-CM

## 2015-02-10 DIAGNOSIS — E785 Hyperlipidemia, unspecified: Secondary | ICD-10-CM | POA: Diagnosis not present

## 2015-02-10 LAB — COMPREHENSIVE METABOLIC PANEL
ALT: 17 U/L (ref 0–35)
AST: 24 U/L (ref 0–37)
Albumin: 4 g/dL (ref 3.5–5.2)
Alkaline Phosphatase: 76 U/L (ref 39–117)
BUN: 17 mg/dL (ref 6–23)
CO2: 26 mEq/L (ref 19–32)
Calcium: 9.7 mg/dL (ref 8.4–10.5)
Chloride: 107 mEq/L (ref 96–112)
Creat: 0.83 mg/dL (ref 0.50–1.10)
Glucose, Bld: 115 mg/dL — ABNORMAL HIGH (ref 70–99)
Potassium: 4.3 mEq/L (ref 3.5–5.3)
Sodium: 142 mEq/L (ref 135–145)
Total Bilirubin: 0.5 mg/dL (ref 0.2–1.2)
Total Protein: 6.3 g/dL (ref 6.0–8.3)

## 2015-02-10 LAB — LDL CHOLESTEROL, DIRECT: Direct LDL: 111 mg/dL — ABNORMAL HIGH

## 2015-02-10 LAB — POCT GLYCOSYLATED HEMOGLOBIN (HGB A1C): Hemoglobin A1C: 6.4

## 2015-02-10 NOTE — Assessment & Plan Note (Signed)
Check direct LDL, CMET - Discussion on starting mod-hi intensity statin therapy in DM patient. Previously intolerance myalgias to Simvastatin. Agreeable to trial on alternative agent, pt plans to discuss with PCP in future.

## 2015-02-10 NOTE — Assessment & Plan Note (Signed)
Chronic Parkinson's, limiting mobility - Completed paperwork for perm Handicap [employer] license plate today, returned to patient for submission, previously had Placard.

## 2015-02-10 NOTE — Progress Notes (Signed)
   Subjective:    Patient ID: Veronica Shaw, female    DOB: 06/10/1951, 64 y.o.   MRN: [id number]  HPI  CHRONIC DM, Type 2: Reports no concerns CBGs: Avg 130s, Low 80s, High <200. Checks CBGs 1-3x daily Meds: Metformin 1000mg  BID. No insulin Reports good compliance. Tolerating well w/o side-effects Currently on ARB Lifestyle: Diet (reduced carbs, minimal sugary drinks) / Exercise (limited due to ambulation) Denies hypoglycemia, polyuria, visual changes, numbness or tingling.  DYSLIPIDEMIA: - Last Lipid panel 05/2013, mildly elevated TG, otherwise stable HDL 49 and low LDL 95. - No new concerns. Admits previous history on Simvastatin (intolerance due to myalgias)  I have reviewed and updated the following as appropriate: allergies and current medications - regarding pt dosing Lasix 40mg  PO daily and K-dur 20mEq daily, patient states these are both on PRN basis has not used frequently, will take both tabs together when does take due to edema.  Social Hx: - Requested completion of paperwork for Permanent Handicap License Plate, (previously had hanging placard for car), known handicap qualifying dx with Parkinson's with limited ambulation, uses cane for assistance. - Never smoker  Review of Systems  See above HPI    Objective:   Physical Exam  BP 133/52 mmHg  Pulse 71  Temp(Src) 98.8 F (37.1 C) (Oral)  Ht 5\' 2"  (1.575 m)  Wt 138 lb (62.596 kg)  BMI 25.23 kg/m2  Gen - pleasant, well-appearing, comfortable, NAD HEENT - MMM  Heart - RRR, no murmurs heard Ext - non-tender, no edema, peripheral pulses intact +2 b/l Skin - warm, dry, no rashes Neuro - awake, alert, oriented     Assessment & Plan:   See specific A&P problem list for details.

## 2015-02-10 NOTE — Assessment & Plan Note (Addendum)
Well-controlled. Only on Metformin Current HgbA1c 6.4 (7/12), last HgbA1c 6.4 (09/2014). No complications or hypoglycemia. - Last Ophtho DM eye exam 09/2014 (negative for DM retinopathy)  Plan:  1. Continue current metformin 1000mg  BID therapy. 2. Lifestyle Mods - Improved DM diet (dec carbs, inc vegs), exercise as tolerated 3. Check CMET prior to starting statin therapy (and patient request for LFT check, given polypharmacy) 4. Check Direct LDL (not fasting) 5. Discussed indication for mod-high intensity statin for DM patient, reduce ASCVD risk 6. RTC 6 mo DM

## 2015-02-10 NOTE — Patient Instructions (Signed)
Dear Dalbert Garnet, Thank you for coming in to clinic today. It was good to meet you!  1. It sounds like you are doing well overall 2. Diabetes is controlled - A1c 6.4 today (stable from last check 6.4) - Keep up the good work. No changes today. 3. Keep up with your Lung and heart doctors 4. Talk to Dr. Sherril Cong about trial of another Statin cholesterol medicine 5. Labs today - will call with results in few days.  Some important numbers from today's visit: BP - BP 133/52 mmHg  Pulse 71  Temp(Src) 98.8 F (37.1 C) (Oral)  Ht 5\' 2"  (1.575 m)  Wt 138 lb (62.596 kg)  BMI 25.23 kg/m2  Results -  Please schedule a follow-up appointment with Dr. Sherril Cong in 3 to 6 months for Diabetes  If you have any other questions or concerns, please feel free to call the clinic to contact me. You may also schedule an earlier appointment if necessary.  However, if your symptoms get significantly worse, please go to the Emergency Department to seek immediate medical attention.  Nobie Putnam, [place]

## 2015-02-16 ENCOUNTER — Encounter: Payer: Self-pay | Admitting: Family Medicine

## 2015-02-16 ENCOUNTER — Telehealth: Payer: Self-pay | Admitting: Family Medicine

## 2015-02-16 ENCOUNTER — Ambulatory Visit (INDEPENDENT_AMBULATORY_CARE_PROVIDER_SITE_OTHER): Payer: Medicare Other | Admitting: Family Medicine

## 2015-02-16 ENCOUNTER — Other Ambulatory Visit: Payer: Self-pay | Admitting: Internal Medicine

## 2015-02-16 VITALS — BP 124/44 | HR 85 | Temp 98.5°F | Ht 62.0 in | Wt 140.7 lb

## 2015-02-16 DIAGNOSIS — R0600 Dyspnea, unspecified: Secondary | ICD-10-CM

## 2015-02-16 DIAGNOSIS — N3090 Cystitis, unspecified without hematuria: Secondary | ICD-10-CM | POA: Diagnosis not present

## 2015-02-16 LAB — POCT URINALYSIS DIPSTICK
Glucose, UA: NEGATIVE
Ketones, UA: 15
Nitrite, UA: NEGATIVE
Protein, UA: >=300
Spec Grav, UA: 1.025
Urobilinogen, UA: 1
pH, UA: 6

## 2015-02-16 LAB — POCT UA - MICROSCOPIC ONLY

## 2015-02-16 MED ORDER — HYDROCORTISONE ACETATE 25 MG RE SUPP: 25.0000 mg | Freq: Two times a day (BID) | RECTAL | Status: DC

## 2015-02-16 MED ORDER — SULFAMETHOXAZOLE-TRIMETHOPRIM 800-160 MG PO TABS: 1.0000 | ORAL_TABLET | Freq: Two times a day (BID) | ORAL | Status: DC

## 2015-02-16 NOTE — Progress Notes (Signed)
    Subjective   Veronica Shaw is a 64 y.o. female that presents for a same day visit  1. Dysuria: Symptoms started yesterday with associated frequency and urgency. No fevers, nausea or vomiting.  2. Hemorrhoids: this is a chronic issue. She has a recent negative colonoscopy with evidence of hemorrhoids per patient report. She has itching and bleeding in the area.   ROS Per HPI  History  Substance Use Topics  . Smoking status: Never Smoker   . Smokeless tobacco: Never Used     Comment: exposed to smoke during child hood (parents)  . Alcohol Use: No    Allergies  Allergen Reactions  . Aspirin Swelling and Other (See Comments)    Causes nose bleeds *ONLY THE COATED ASA*  . Penicillins Shortness Of Breath and Rash    Shortness of Breath - Throat felt like it was closing.   [name] [Conj Estrog-Medroxyprogest Ace] Shortness Of Breath    Throat swelling  . Ace Inhibitors Cough  . Simvastatin Other (See Comments) and Rash    Muscle aches    Objective   BP 124/44 mmHg  Pulse 85  Temp(Src) 98.5 F (36.9 C) (Oral)  Ht 5\' 2"  (1.575 m)  Wt 140 lb 11.2 oz (63.821 kg)  BMI 25.73 kg/m2  General: Well appearing, no distress Genitourinary: suprapubic tenderness  Assessment and Plan   Meds ordered this encounter  Medications  . DISCONTD: sulfamethoxazole-trimethoprim (BACTRIM DS,SEPTRA DS) 800-160 MG per tablet    Sig: Take 1 tablet by mouth 2 (two) times daily.    Dispense:  6 tablet    Refill:  0  . DISCONTD: hydrocortisone (ANUSOL-HC) 25 MG suppository    Sig: Place 1 suppository (25 mg total) rectally 2 (two) times daily.    Dispense:  12 suppository    Refill:  0  . hydrocortisone (ANUSOL-HC) 25 MG suppository    Sig: Place 1 suppository (25 mg total) rectally 2 (two) times daily.    Dispense:  12 suppository    Refill:  0  . sulfamethoxazole-trimethoprim (BACTRIM DS,SEPTRA DS) 800-160 MG per tablet    Sig: Take 1 tablet by mouth 2 (two) times daily.   Dispense:  6 tablet    Refill:  0    Urinary tract infection, upper: urine significant for muddy brown appearance, proteinuria and hematuria. Possibly due to acute infection however will likely need follow-up  Bactrim DS BID x3 days  Follow-up if persisting hematuria  Recommend following up repeat urine when not having symptoms with patient's overt hematuria  Hx of Hemorrhoids  Hydrocortisone suppository

## 2015-02-16 NOTE — Telephone Encounter (Signed)
Pt called because she said the cream that was called in today is 91.00 and she cannot afford this. She would like something else called in. jw

## 2015-02-16 NOTE — Patient Instructions (Addendum)
Thank you for coming to see me today. It was a pleasure. Today we talked about:   Urinary tract infection: it appears you have an infection. I will obtain a urine culture. You had a lot of blood in your urine too, though. I want you to follow-up next week for this if it has not resolved with the resolution of your urinary tract infection  Hemorrhoids: I am giving you a suppository to use for your itching and irritaiton.  If you have any questions or concerns, please do not hesitate to call the office at [phone].  Sincerely,  Cordelia Poche, MD

## 2015-02-17 ENCOUNTER — Ambulatory Visit (INDEPENDENT_AMBULATORY_CARE_PROVIDER_SITE_OTHER): Payer: Medicare Other | Admitting: Internal Medicine

## 2015-02-17 ENCOUNTER — Encounter: Payer: Self-pay | Admitting: Internal Medicine

## 2015-02-17 ENCOUNTER — Other Ambulatory Visit: Payer: Self-pay | Admitting: Family Medicine

## 2015-02-17 VITALS — BP 118/62 | HR 87 | Ht 61.0 in | Wt 138.0 lb

## 2015-02-17 DIAGNOSIS — J4550 Severe persistent asthma, uncomplicated: Secondary | ICD-10-CM

## 2015-02-17 DIAGNOSIS — R0600 Dyspnea, unspecified: Secondary | ICD-10-CM

## 2015-02-17 DIAGNOSIS — K644 Residual hemorrhoidal skin tags: Secondary | ICD-10-CM

## 2015-02-17 LAB — PULMONARY FUNCTION TEST
DL/VA % pred: 117 %
DL/VA: 5.19 ml/min/mmHg/L
DLCO unc % pred: 107 %
DLCO unc: 21.77 ml/min/mmHg
FEF 25-75 Post: 2.26 L/sec
FEF 25-75 Pre: 1.27 L/sec
FEF2575-%Change-Post: 78 %
FEF2575-%Pred-Post: 118 %
FEF2575-%Pred-Pre: 66 %
FEV1-%Change-Post: 17 %
FEV1-%Pred-Post: 93 %
FEV1-%Pred-Pre: 79 %
FEV1-Post: 1.97 L
FEV1-Pre: 1.67 L
FEV1FVC-%Change-Post: 5 %
FEV1FVC-%Pred-Pre: 97 %
FEV6-%Change-Post: 11 %
FEV6-%Pred-Post: 93 %
FEV6-%Pred-Pre: 84 %
FEV6-Post: 2.48 L
FEV6-Pre: 2.23 L
FEV6FVC-%Pred-Post: 104 %
FEV6FVC-%Pred-Pre: 104 %
FVC-%Change-Post: 11 %
FVC-%Pred-Post: 89 %
FVC-%Pred-Pre: 80 %
FVC-Post: 2.49 L
FVC-Pre: 2.23 L
Post FEV1/FVC ratio: 79 %
Post FEV6/FVC ratio: 100 %
Pre FEV1/FVC ratio: 75 %
Pre FEV6/FVC Ratio: 100 %
RV % pred: 70 %
RV: 1.38 L
TLC % pred: 93 %
TLC: 4.32 L

## 2015-02-17 MED ORDER — HYDROCORTISONE 1 % EX OINT: 1.0000 "application " | TOPICAL_OINTMENT | Freq: Two times a day (BID) | CUTANEOUS | Status: DC

## 2015-02-17 NOTE — Telephone Encounter (Signed)
New rx for ointment sent to pharmacy. Please let patient know.

## 2015-02-17 NOTE — Patient Instructions (Signed)
Try dulera 200 2puffs every am and pm dose is optional   Work on inhaler technique:  relax and gently blow all the way out then take a nice smooth deep breath back in, triggering the inhaler at same time you start breathing in.  Hold for up to 5 seconds if you can.  Rinse and gargle with water when done     Only use your albuterol as a rescue medication to be used if you can't catch your breath by resting or doing a relaxed purse lip breathing pattern.  - The less you use it, the better it will work when you need it. - Ok to use up to 2 puffs  every 4 hours if you must but call for immediate appointment if use goes up over your usual need - Don't leave home without it !!  (think of it like the spare tire for your car)   Please schedule a follow up visit in 3 months but call sooner if needed

## 2015-02-17 NOTE — Progress Notes (Signed)
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 Advice Only    referred by Dr. Burt Knack for asthma.    used neb alb and ventolin but > 4h  prior to OV  Last steroids > 3 weeks prior to OV  And worse since finished , esp at hs  With cough/ wheeze rec Start dulera 200 Take 2 puffs first thing in am and then another 2 puffs about 12 hours later.  Work on inhaler technique:   Only use your albuterol (ventolin) as a rescue Only use your nebulizer if you try the ventolin first and it doesn't work Continue Pantoprazole (protonix) 40 mg but be sure to  Take  30-60 min before first meal of the day and Zantac 150 mg one @  bedtime GERD (REFLUX)     Please remember to go to the x-ray department downstairs for your tests - we will call you with the results when they are available.      02/17/2015 f/u ov/[facility] re: asthma   Chief Complaint  Patient presents with  . Follow-up    PFT today.  Breathing doing great. only complaint today is leg cramps from feet to knees.  much better on dulera 200 2 bid and no need for saba but noct muscle cramps have developed on it  .No obvious day to day or daytime variability or assoc chronic cough or cp or chest tightness, subjective wheeze or overt sinus or hb symptoms. No unusual exp hx or h/o childhood pna/ asthma or knowledge of premature birth.  Sleeping ok without nocturnal  or early am  exacerbation  of respiratory  c/o's or need for noct saba. Also denies any obvious fluctuation of symptoms with weather or environmental changes or other aggravating or alleviating factors except as outlined above   Current Medications, Allergies, Complete Past Medical History, Past Surgical History, Family History, and Social History were reviewed in [employer] record.  ROS  The following are not active complaints unless bolded sore throat, dysphagia, dental problems, itching, sneezing,  nasal congestion or excess/ purulent secretions, ear ache,   fever, chills, sweats, unintended wt loss, classically pleuritic or exertional cp, hemoptysis,  orthopnea pnd or leg swelling, presyncope, palpitations, abdominal pain, anorexia, nausea, vomiting, diarrhea  or change in bowel or bladder habits, change in stools or urine, dysuria,hematuria,  rash, arthralgias, visual complaints, headache, numbness, weakness or ataxia or problems with walking or coordination,  change in mood/affect or memory.           Objective:   Physical Exam  amb bf  nad   02/17/2015       138   Wt Readings from Last 3 Encounters:  01/05/15 137 lb (62.143 kg)  12/22/14 137 lb (62.143 kg)  12/17/14 138 lb (62.596 kg)  Vital signs reviewed  HEENT: nl dentition, turbinates, and orophanx. Nl external ear canals without cough reflex   NECK :  without JVD/Nodes/TM/ nl carotid upstrokes bilaterally   LUNGS: no acc muscle use, clear to A and P bilaterally without cough on insp or exp maneuvers   CV:  RRR  no s3 or murmur or increase in P2, no edema   ABD:  soft and nontender with nl excursion in the supine position. No bruits or organomegaly, bowel sounds nl  MS:  warm without deformities, calf tenderness, cyanosis or clubbing  SKIN: warm and dry without lesions    NEURO:  alert, approp, no deficits      CXR PA and Lateral:   01/05/2015 :     I personally reviewed images and agree with  radiology impression as follows:    No active cardiopulmonary disease.      Assessment & Plan:

## 2015-02-17 NOTE — Progress Notes (Signed)
PFT done today. 

## 2015-02-17 NOTE — Telephone Encounter (Signed)
Patient informed. 

## 2015-02-18 ENCOUNTER — Encounter: Payer: Self-pay | Admitting: Family Medicine

## 2015-02-18 LAB — URINE CULTURE: Colony Count: 8000

## 2015-02-19 ENCOUNTER — Encounter: Payer: Self-pay | Admitting: Internal Medicine

## 2015-02-19 NOTE — Assessment & Plan Note (Signed)
-   Spirometry 01/05/15  FEV1  0.98 (46%) ratio 62  - 01/05/2015 p extensive coaching HFA effectiveness =    50% > try dulera 200 2bid - PFTs 02/17/15  FEV1  1.97 ( 93%) ratio 79 p 17% improvement from saba and dlco 107   The proper method of use, as well as anticipated side effects, of a metered-dose inhaler are discussed and demonstrated to the patient. Improved effectiveness after extensive coaching during this visit to a level of approximately  75% so needs more work  Clearly has very sign but completely reversible airflow obst with approp rx .  The noct cramping is likely from the laba so rec try dulea 200 2 each am and if needs a pm dose could use qvar 80 x 2 at hs  I had an extended discussion with the patient reviewing all relevant studies completed to date and  lasting 15 to 20 minutes of a 25 minute visit    Each maintenance medication was reviewed in detail including most importantly the difference between maintenance and prns and under what circumstances the prns are to be triggered using an action plan format that is not reflected in the computer generated alphabetically organized AVS.    Please see instructions for details which were reviewed in writing and the patient given a copy highlighting the part that I personally wrote and discussed at today's ov.

## 2015-03-11 ENCOUNTER — Ambulatory Visit (INDEPENDENT_AMBULATORY_CARE_PROVIDER_SITE_OTHER): Payer: Medicare Other | Admitting: Podiatry

## 2015-03-11 ENCOUNTER — Encounter: Payer: Self-pay | Admitting: Podiatry

## 2015-03-11 VITALS — BP 133/68 | HR 73 | Resp 12

## 2015-03-11 DIAGNOSIS — L600 Ingrowing nail: Secondary | ICD-10-CM

## 2015-03-11 NOTE — Patient Instructions (Signed)
Diabetes and Foot Care Diabetes may cause you to have problems because of poor blood supply (circulation) to your feet and legs. This may cause the skin on your feet to become thinner, break easier, and heal more slowly. Your skin may become dry, and the skin may peel and crack. You may also have nerve damage in your legs and feet causing decreased feeling in them. You may not notice minor injuries to your feet that could lead to infections or more serious problems. Taking care of your feet is one of the most important things you can do for yourself.  HOME CARE INSTRUCTIONS  Wear shoes at all times, even in the house. Do not go barefoot. Bare feet are easily injured.  Check your feet daily for blisters, cuts, and redness. If you cannot see the bottom of your feet, use a mirror or ask someone for help.  Wash your feet with warm water (do not use hot water) and mild soap. Then pat your feet and the areas between your toes until they are completely dry. Do not soak your feet as this can dry your skin.  Apply a moisturizing lotion or petroleum jelly (that does not contain alcohol and is unscented) to the skin on your feet and to dry, brittle toenails. Do not apply lotion between your toes.  Trim your toenails straight across. Do not dig under them or around the cuticle. File the edges of your nails with an emery board or nail file.  Do not cut corns or calluses or try to remove them with medicine.  Wear clean socks or stockings every day. Make sure they are not too tight. Do not wear knee-high stockings since they may decrease blood flow to your legs.  Wear shoes that fit properly and have enough cushioning. To break in new shoes, wear them for just a few hours a day. This prevents you from injuring your feet. Always look in your shoes before you put them on to be sure there are no objects inside.  Do not cross your legs. This may decrease the blood flow to your feet.  If you find a minor scrape,  cut, or break in the skin on your feet, keep it and the skin around it clean and dry. These areas may be cleansed with mild soap and water. Do not cleanse the area with peroxide, alcohol, or iodine.  When you remove an adhesive bandage, be sure not to damage the skin around it.  If you have a wound, look at it several times a day to make sure it is healing.  Do not use heating pads or hot water bottles. They may burn your skin. If you have lost feeling in your feet or legs, you may not know it is happening until it is too late.  Make sure your health care provider performs a complete foot exam at least annually or more often if you have foot problems. Report any cuts, sores, or bruises to your health care provider immediately. SEEK MEDICAL CARE IF:   You have an injury that is not healing.  You have cuts or breaks in the skin.  You have an ingrown nail.  You notice redness on your legs or feet.  You feel burning or tingling in your legs or feet.  You have pain or cramps in your legs and feet.  Your legs or feet are numb.  Your feet always feel cold. SEEK IMMEDIATE MEDICAL CARE IF:   There is increasing redness,   swelling, or pain in or around a wound.  There is a red line that goes up your leg.  Pus is coming from a wound.  You develop a fever or as directed by your health care provider.  You notice a bad smell coming from an ulcer or wound. Document Released: 07/15/2000 Document Revised: 03/20/2013 Document Reviewed: 12/25/2012 Exit[employer] Patient Information 2015 [employer]. This information is not intended to replace advice given to you by your health care provider. Make sure you discuss any questions you have with your health care provider.  

## 2015-03-11 NOTE — Progress Notes (Signed)
   Subjective:    Patient ID: Veronica Shaw, female    DOB: 16-Jul-1951, 64 y.o.   MRN: [id number]  HPI   This patient presents today complaining of soreness along the medial margins of the right and left hallux toenails aggravated with direct shoe pressure and gradually becoming more uncomfortable over time. She describes self treatment, trimming without any reduction of the symptoms. She denies any infection to the areas or any professional treatment.   Review of Systems  Eyes: Positive for redness.  Respiratory: Positive for shortness of breath.        Objective:   Physical Exam  Dictated 3  Vascular: DP and PT pulses 2/4 bilaterally Capillary reflex immediate bilaterally  Neurological: Sensation to 10 g monofilament wire intact 4/5 right and 3/5 left Vibratory sensation reactive bilaterally Ankle reflex equal and reactive bilaterally  Dermatological: No open skin lesions noted bilaterally Well-healed surgical scar dorsal right first MPJ Mildly incurvated medial margins of right and left hallux toenails. There is no surrounding erythema, edema, warmth or drainage in these areas  Musculoskeletal: HAV deformity left There is no restriction ankle, subtalar, midtarsal joints bilaterally      Assessment & Plan:   Assessment: Satisfactory neurovascular status Incurvated medial margins of the right and left hallux toenails without any clinical sign of infection Complex medical history  Plan: There review the results of the examination with patient today. As patient is not any clinical sign of infection I am recommending that the margins be debrided. Also, because for complex medical history would like to avoid any aggressive invasive procedures.  The medial margins of the right and left hallux toenail were debrided without any bleeding the medial margins were mildly callused. I told patient she could apply some Vaseline and a Band-Aid to the edges of the hallux  nails daily until the callused nail groove soften  Return as needed

## 2015-03-27 ENCOUNTER — Encounter: Payer: Self-pay | Admitting: *Deleted

## 2015-04-15 ENCOUNTER — Other Ambulatory Visit: Payer: Self-pay

## 2015-04-15 MED ORDER — METOPROLOL SUCCINATE ER 50 MG PO TB24: 50.0000 mg | ORAL_TABLET | Freq: Every day | ORAL | Status: DC

## 2015-04-17 ENCOUNTER — Ambulatory Visit: Payer: Medicare Other | Admitting: Cardiovascular Disease

## 2015-04-23 ENCOUNTER — Ambulatory Visit (INDEPENDENT_AMBULATORY_CARE_PROVIDER_SITE_OTHER): Payer: Medicare Other | Admitting: Cardiovascular Disease

## 2015-04-23 ENCOUNTER — Encounter: Payer: Self-pay | Admitting: Cardiovascular Disease

## 2015-04-23 VITALS — BP 110/64 | HR 86 | Ht 62.0 in | Wt 141.2 lb

## 2015-04-23 DIAGNOSIS — I5022 Chronic systolic (congestive) heart failure: Secondary | ICD-10-CM

## 2015-04-23 DIAGNOSIS — R5382 Chronic fatigue, unspecified: Secondary | ICD-10-CM | POA: Diagnosis not present

## 2015-04-23 DIAGNOSIS — R42 Dizziness and giddiness: Secondary | ICD-10-CM | POA: Diagnosis not present

## 2015-04-23 LAB — CBC
HCT: 40.6 % (ref 36.0–46.0)
Hemoglobin: 13.7 g/dL (ref 12.0–15.0)
MCHC: 33.8 g/dL (ref 30.0–36.0)
MCV: 88.9 fl (ref 78.0–100.0)
Platelets: 210 10*3/uL (ref 150.0–400.0)
RBC: 4.57 Mil/uL (ref 3.87–5.11)
RDW: 12.4 % (ref 11.5–15.5)
WBC: 6.4 10*3/uL (ref 4.0–10.5)

## 2015-04-23 LAB — T3: T3, Total: 80.7 ng/dL (ref 80.0–204.0)

## 2015-04-23 LAB — TSH: TSH: 1.5 u[IU]/mL (ref 0.35–4.50)

## 2015-04-23 LAB — T4: T4, Total: 7.8 ug/dL (ref 4.5–12.0)

## 2015-04-23 MED ORDER — VALSARTAN 40 MG PO TABS: 40.0000 mg | ORAL_TABLET | Freq: Every day | ORAL | Status: DC

## 2015-04-23 NOTE — Patient Instructions (Addendum)
Medication Instructions:   START TAKING 40 MG OF DIOVAN DAILY   Labwork:  TSH T3 T4 CBC   Testing/Procedures:NONE ORDER TODAY    Follow-Up:  FOUR MONTHS WITH WEAVER   Any Other Special Instructions Will Be Listed Below (If Applicable).

## 2015-04-23 NOTE — Progress Notes (Signed)
Cardiology Office Note Date:  04/23/2015   ID:  Veronica Shaw, DOB [birth year], MRN [id number]  PCP:  Beverlyn Roux, MD  Cardiologist:  Sherren Mocha, MD    Chief Complaint  Patient presents with  . Shortness of Breath    History of Present Illness: Veronica Shaw is a 64 y.o. female who presents for follow-up of chronic systolic heart failure. 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 She was hospitalized in October 2014 with chest pain or shortness of breath. At that time she was diagnosed with a cardiomyopathy with LVEF estimated at 35% as well as a left bundle branch block pattern on EKG. Cardiac catheterization demonstrated patent coronary arteries, normal intracardiac hemodynamics, and severe LV dysfunction. The patient and persistent severe LV dysfunction despite optimal medical therapy. She underwent ICD implantation in April 2015 by Dr. Lovena Le. She has had intermittent left bundle branch block.  The patient complains of dizziness. There is no postural component present. She has held Diovan recently but hasn't appreciated much change. She denies chest pain or pressure. Shortness of breath is improved since starting Dulera.  Denies orthopnea or PND. Gained about 5 pounds since last year.  Past Medical History  Diagnosis Date  . Sleep apnea     No CPAP  . Gastritis   . Plantar fasciitis   . Diabetes mellitus   . Hyperlipidemia   . Asthma   . Urticaria   . Parkinson disease   . CHF (congestive heart failure)     Past Surgical History  Procedure Laterality Date  . Tubal ligation    . Cholecystectomy    . Bunionectomy    . Implantable cardioverter defibrillator implant  11-25-13    MDT dual chamber ICD implanted by [name] for primary prevention  . Left and right heart catheterization with coronary angiogram N/A 05/31/2013    Procedure: LEFT AND RIGHT HEART CATHETERIZATION WITH CORONARY ANGIOGRAM;   Surgeon: [name], MD;  Location: [facility] CATH LAB;  Service: Cardiovascular;  Laterality: N/A;  . Implantable cardioverter defibrillator implant N/A 11/25/2013    Procedure: IMPLANTABLE CARDIOVERTER DEFIBRILLATOR IMPLANT;  Surgeon: [name], MD;  Location: [facility] CATH LAB;  Service: Cardiovascular;  Laterality: N/A;    Current Outpatient Prescriptions  Medication Sig Dispense Refill  . albuterol (PROVENTIL HFA;VENTOLIN HFA) 108 (90 BASE) MCG/ACT inhaler Inhale 2 puffs into the lungs every 6 (six) hours as needed for wheezing. 1 Inhaler 1  . albuterol (PROVENTIL) (2.5 MG/3ML) 0.083% nebulizer solution Take 3 mLs (2.5 mg total) by nebulization 2 (two) times daily. Dx 493.9 75 mL 2  . carbidopa-levodopa (SINEMET) 25-100 MG per tablet Take 1 tablet by mouth 3 (three) times daily. For parkinsons    . cyclobenzaprine (FLEXERIL) 5 MG tablet Take 1 tablet (5 mg total) by mouth 3 (three) times daily as needed for muscle spasms. 30 tablet 1  . EPINEPHrine (EPIPEN) 0.3 mg/0.3 mL SOAJ injection Inject 0.3 mg into the muscle as needed (allergic reaction).    . furosemide (LASIX) 40 MG tablet Take 20 mg by mouth as needed.     . hydrocortisone (ANUSOL-HC) 25 MG suppository Place 1 suppository (25 mg total) rectally 2 (two) times daily. 12 suppository 0  . hydrocortisone 1 % ointment Apply 1 application topically 2 (two) times daily. 30 g 0  . ipratropium (ATROVENT) 0.06 % nasal spray Place 1 spray into both nostrils 2 (two) times daily.     [name] Kitchen  meloxicam (MOBIC) 7.5 MG tablet Take 7.5 mg by mouth daily as needed for pain.    . metFORMIN (GLUCOPHAGE) 500 MG tablet TAKE TWO TABLETS BY MOUTH TWICE DAILY WITH A MEAL 360 tablet 4  . metoprolol succinate (TOPROL-XL) 50 MG 24 hr tablet Take 1 tablet (50 mg total) by mouth daily. 90 tablet 4  . metroNIDAZOLE (METROGEL) 1 % gel Apply 1 application topically daily. Apply to rosacea    . mometasone-formoterol (DULERA) 200-5 MCG/ACT AERO Take 2 puffs first thing in am  and then another 2 puffs about 12 hours later. 1 Inhaler 11  . nitroGLYCERIN (NITROSTAT) 0.4 MG SL tablet Place 1 tablet (0.4 mg total) under the tongue every 5 (five) minutes as needed for chest pain. Take one tablet and place under tongue if develop chest pain may repeat x2 25 tablet 3  . pantoprazole (PROTONIX) 40 MG tablet Take 1 tablet (40 mg total) by mouth daily. 30 tablet 0  . potassium chloride (K-DUR) 10 MEQ tablet Take 1 tablet (10 mEq total) by mouth daily. 60 tablet 6  . PRESCRIPTION MEDICATION Take 1 application by mouth daily as needed (for rosacea). Cream from medication assistance program    . ranitidine (ZANTAC) 150 MG tablet Take 1 tablet (150 mg total) by mouth daily. 90 tablet 4  . spironolactone (ALDACTONE) 25 MG tablet Take 1 tablet (25 mg total) by mouth daily. 90 tablet 3  . valsartan (DIOVAN) 40 MG tablet Take 1 tablet (40 mg total) by mouth daily. 90 tablet 2   No current facility-administered medications for this visit.    Allergies:   Aspirin; Penicillins; Prempro; Ace inhibitors; and Simvastatin   Social History:  The patient  reports that she has never smoked. She has never used smokeless tobacco. She reports that she does not drink alcohol or use illicit drugs.   Family History:  The patient's  family history includes Coronary artery disease in her father and mother; Diabetes in her father and mother; Heart attack in her father and mother; Stroke in her mother.    ROS:  Please see the history of present illness.  Otherwise, review of systems is positive for  Dizziness , snoring, balance problems , blood in stool.  All other systems are reviewed and negative.   PHYSICAL EXAM: VS:  BP 110/64 mmHg  Pulse 86  Ht 5\' 2"  (1.575 m)  Wt 141 lb 3.2 oz (64.048 kg)  BMI 25.82 kg/m2 , BMI Body mass index is 25.82 kg/(m^2). GEN: Well nourished, well developed, in no acute distress HEENT: normal Neck: no JVD, no masses. No carotid bruits Cardiac: RRR without murmur or  gallop                Respiratory:  clear to auscultation bilaterally, normal work of breathing GI: soft, nontender, nondistended, + BS MS: no deformity or atrophy Ext: no pretibial edema, pedal pulses 2+= bilaterally Skin: warm and dry, no rash Neuro:  Strength and sensation are intact Psych: euthymic mood, full affect  EKG:  EKG is ordered today. The ekg ordered today shows  Normal sinus rhythm 86 bpm, left bundle branch block.  Recent Labs: 12/09/2014: Pro B Natriuretic peptide (BNP) 52.0 02/10/2015: ALT 17; BUN 17; Creat 0.83; Potassium 4.3; Sodium 142 04/23/2015: Hemoglobin 13.7; Platelets 210.0; TSH 1.50   Lipid Panel     Component Value Date/Time   CHOL 172 05/31/2013 0435   TRIG 142 05/31/2013 0435   HDL 49 05/31/2013 0435   CHOLHDL 3.5  05/31/2013 0435   VLDL 28 05/31/2013 0435   LDLCALC 95 05/31/2013 0435   LDLDIRECT 111* 02/10/2015 1145   Wt Readings from Last 3 Encounters:  04/23/15 141 lb 3.2 oz (64.048 kg)  02/17/15 138 lb (62.596 kg)  02/16/15 140 lb 11.2 oz (63.821 kg)    Cardiac Studies Reviewed:  2-D echocardiogram 09/20/2013: Study Conclusions  - Left ventricle: The cavity size was normal. Systolic function was moderately to severely reduced. The estimated ejection fraction was in the range of 30% to 35%. Diffuse hypokinesis. There is severe hypokinesis to akinesis of the entire anteroseptal myocardium. Features are consistent with a pseudonormal left ventricular filling pattern, with concomitant abnormal relaxation and increased filling pressure (grade 2 diastolic dysfunction). - Mitral valve: Mild regurgitation. - Tricuspid valve: Trivial regurgitation.  ASSESSMENT AND PLAN: 1.   Chronic systolic heart failure, New York Heart Association functional class II: the patient is tolerating metoprolol succinate and Aldactone. I asked her to start back on valsartan dose of 40 mg daily. We'll see how she tolerates this. I will ask Richardson Dopp  to see her back in 4 months.   2. Essential hypertension: blood pressure controlled , I'm not sure that dizziness is related to medication side effect or blood pressure. Will start her back on low-dose valsartan which she has been holding for the past week without any change in symptoms.   3. Status post ICD: Followed by Dr. Lovena Le.  Current medicines are reviewed with the patient today.  The patient does not have concerns regarding medicines.  Labs/ tests ordered today include:   Orders Placed This Encounter  Procedures  . CBC  . TSH  . T3  . T4  . EKG 12-Lead   Disposition:   FU 4 months with Richardson Dopp, PA  Signed, Sherren Mocha, MD  04/23/2015 1:38 PM    [facility] [place], [place], [place]  [address] Phone: [phone]; Fax: [phone]

## 2015-04-27 ENCOUNTER — Telehealth: Payer: Self-pay | Admitting: Cardiovascular Disease

## 2015-04-27 NOTE — Telephone Encounter (Signed)
New message  ° ° °Patient calling for test results.   °

## 2015-04-27 NOTE — Telephone Encounter (Signed)
Informed pt of lab results. Pt verbalized understanding. 

## 2015-05-05 ENCOUNTER — Encounter: Payer: Self-pay | Admitting: Internal Medicine

## 2015-05-05 ENCOUNTER — Ambulatory Visit (INDEPENDENT_AMBULATORY_CARE_PROVIDER_SITE_OTHER): Payer: Medicare Other | Admitting: Internal Medicine

## 2015-05-05 VITALS — BP 114/68 | HR 82 | Ht 61.0 in | Wt 141.6 lb

## 2015-05-05 DIAGNOSIS — Z95810 Presence of automatic (implantable) cardiac defibrillator: Secondary | ICD-10-CM

## 2015-05-05 DIAGNOSIS — I5022 Chronic systolic (congestive) heart failure: Secondary | ICD-10-CM

## 2015-05-05 DIAGNOSIS — I10 Essential (primary) hypertension: Secondary | ICD-10-CM | POA: Diagnosis not present

## 2015-05-05 LAB — CUP PACEART INCLINIC DEVICE CHECK
Battery Remaining Longevity: 123 mo
Battery Voltage: 3.01 V
Brady Statistic AP VP Percent: 0 %
Brady Statistic AP VS Percent: 0.39 %
Brady Statistic AS VP Percent: 0.04 %
Brady Statistic AS VS Percent: 99.57 %
Brady Statistic RA Percent Paced: 0.4 %
Brady Statistic RV Percent Paced: 0.04 %
Date Time Interrogation Session: 20161004110158
HighPow Impedance: 304 Ohm
HighPow Impedance: 81 Ohm
Lead Channel Impedance Value: 4047 Ohm
Lead Channel Impedance Value: 4047 Ohm
Lead Channel Impedance Value: 4047 Ohm
Lead Channel Impedance Value: 418 Ohm
Lead Channel Impedance Value: 817 Ohm
Lead Channel Pacing Threshold Amplitude: 0.5 V
Lead Channel Pacing Threshold Amplitude: 0.75 V
Lead Channel Pacing Threshold Pulse Width: 0.4 ms
Lead Channel Pacing Threshold Pulse Width: 0.4 ms
Lead Channel Sensing Intrinsic Amplitude: 16.75 mV
Lead Channel Sensing Intrinsic Amplitude: 2.625 mV
Lead Channel Setting Pacing Amplitude: 2 V
Lead Channel Setting Pacing Amplitude: 2.5 V
Lead Channel Setting Pacing Pulse Width: 0.4 ms
Lead Channel Setting Sensing Sensitivity: 0.3 mV
Zone Setting Detection Interval: 300 ms
Zone Setting Detection Interval: 350 ms
Zone Setting Detection Interval: 360 ms
Zone Setting Detection Interval: 360 ms

## 2015-05-05 NOTE — Patient Instructions (Addendum)
Medication Instructions:  Your physician recommends that you continue on your current medications as directed. Please refer to the Current Medication list given to you today.   Labwork: None ordered  Testing/Procedures: None ordered  Follow-Up: Remote monitoring is used to monitor your  ICD from home. This monitoring reduces the number of office visits required to check your device to one time per year. It allows [place] to keep an eye on the functioning of your device to ensure it is working properly. You are scheduled for a device check from home on 08/04/15. You may send your transmission at any time that day. If you have a wireless device, the transmission will be sent automatically. After your physician reviews your transmission, you will receive a postcard with your next transmission date.    Your physician wants you to follow-up in: 12 months with Dr Knox Saliva will receive a reminder letter in the mail two months in advance. If you don't receive a letter, please call our office to schedule the follow-up appointment.   Any Other Special Instructions Will Be Listed Below (If Applicable).

## 2015-05-05 NOTE — Assessment & Plan Note (Signed)
Her Medtronic device is working normally. Will  Recheck in several months.

## 2015-05-05 NOTE — Assessment & Plan Note (Signed)
Her blood pressure is well controlled. Will follow.  

## 2015-05-05 NOTE — Assessment & Plan Note (Signed)
Her symptoms are class 2. She will continue her current meds. She will maintain a low sodium diet. 

## 2015-05-05 NOTE — Progress Notes (Signed)
HPI Veronica Shaw returns today for followup. She is a pleasant 64 yo woman with a h/o an ICM, chronic systolic heart failure, and COPD/Asthma. She has had intermittant LBBB.  She has had no syncope or ICD shock. She admits to a very sedentary life style but has started to move a bit more. 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 No edema. Allergies  Allergen Reactions  . Aspirin Swelling and Other (See Comments)    Causes nose bleeds *ONLY THE COATED ASA*  . Penicillins Shortness Of Breath and Rash    Shortness of Breath - Throat felt like it was closing.   [name] [Conj Estrog-Medroxyprogest Ace] Shortness Of Breath    Throat swelling  . Ace Inhibitors Cough  . Simvastatin Other (See Comments) and Rash    Muscle aches     Current Outpatient Prescriptions  Medication Sig Dispense Refill  . albuterol (PROVENTIL HFA;VENTOLIN HFA) 108 (90 BASE) MCG/ACT inhaler Inhale 2 puffs into the lungs every 6 (six) hours as needed for wheezing. 1 Inhaler 1  . albuterol (PROVENTIL) (2.5 MG/3ML) 0.083% nebulizer solution Take 3 mLs (2.5 mg total) by nebulization 2 (two) times daily. Dx 493.9 75 mL 2  . carbidopa-levodopa (SINEMET) 25-100 MG per tablet Take 1 tablet by mouth 3 (three) times daily. For parkinsons    . cyclobenzaprine (FLEXERIL) 5 MG tablet Take 1 tablet (5 mg total) by mouth 3 (three) times daily as needed for muscle spasms. 30 tablet 1  . EPINEPHrine (EPIPEN) 0.3 mg/0.3 mL SOAJ injection Inject 0.3 mg into the muscle as needed (allergic reaction).    . furosemide (LASIX) 40 MG tablet Take 20 mg by mouth daily as needed (swelling).     . hydrocortisone (ANUSOL-HC) 25 MG suppository Place 1 suppository (25 mg total) rectally 2 (two) times daily. 12 suppository 0  . hydrocortisone 1 % ointment Apply 1 application topically 2 (two) times daily. 30 g 0  . ipratropium (ATROVENT) 0.06 % nasal spray Place 1 spray into both nostrils 2 (two) times daily as needed for rhinitis.      . meloxicam (MOBIC) 7.5 MG tablet Take 7.5 mg by mouth daily as needed for pain.    . metFORMIN (GLUCOPHAGE) 500 MG tablet TAKE TWO TABLETS BY MOUTH TWICE DAILY WITH A MEAL 360 tablet 4  . metoprolol succinate (TOPROL-XL) 50 MG 24 hr tablet Take 1 tablet (50 mg total) by mouth daily. 90 tablet 4  . metroNIDAZOLE (METROGEL) 1 % gel Apply 1 application topically daily. Apply to rosacea    . mometasone-formoterol (DULERA) 200-5 MCG/ACT AERO Take 2 puffs first thing in am and then another 2 puffs about 12 hours later. 1 Inhaler 11  . nitroGLYCERIN (NITROSTAT) 0.4 MG SL tablet Place 1 tablet (0.4 mg total) under the tongue every 5 (five) minutes as needed for chest pain. Take one tablet and place under tongue if develop chest pain may repeat x2 25 tablet 3  . pantoprazole (PROTONIX) 40 MG tablet Take 1 tablet (40 mg total) by mouth daily. 30 tablet 0  . potassium chloride (K-DUR) 10 MEQ tablet Take 1 tablet (10 mEq total) by mouth daily. 60 tablet 6  . PRESCRIPTION MEDICATION Take 1 application by mouth daily as needed (for rosacea). Cream from medication assistance program    . ranitidine (ZANTAC) 150 MG tablet Take 1 tablet (150 mg total) by mouth daily. 90 tablet 4  . spironolactone (ALDACTONE) 25 MG tablet Take 1  tablet (25 mg total) by mouth daily. 90 tablet 3  . valsartan (DIOVAN) 40 MG tablet Take 1 tablet (40 mg total) by mouth daily. 90 tablet 2   No current facility-administered medications for this visit.     Past Medical History  Diagnosis Date  . Sleep apnea     No CPAP  . Gastritis   . Plantar fasciitis   . Diabetes mellitus   . Hyperlipidemia   . Asthma   . Urticaria   . Parkinson disease ([place])   . CHF (congestive heart failure) ([facility])     ROS:   All systems reviewed and negative except as noted in the HPI.   Past Surgical History  Procedure Laterality Date  . Tubal ligation    . Cholecystectomy    . Bunionectomy    . Implantable cardioverter defibrillator implant   11-25-13    MDT dual chamber ICD implanted by [name] for primary prevention  . Left and right heart catheterization with coronary angiogram N/A 05/31/2013    Procedure: LEFT AND RIGHT HEART CATHETERIZATION WITH CORONARY ANGIOGRAM;  Surgeon: [name], MD;  Location: [facility] CATH LAB;  Service: Cardiovascular;  Laterality: N/A;  . Implantable cardioverter defibrillator implant N/A 11/25/2013    Procedure: IMPLANTABLE CARDIOVERTER DEFIBRILLATOR IMPLANT;  Surgeon: [name], MD;  Location: [facility] CATH LAB;  Service: Cardiovascular;  Laterality: N/A;     Family History  Problem Relation Age of Onset  . Coronary artery disease Father     Died age 60  . Heart attack Father   . Diabetes Father   . Coronary artery disease Mother     Died age 69  . Heart attack Mother   . Diabetes Mother   . Stroke Mother      Social History   Social History  . Marital Status: Married    Spouse Name: N/A  . Number of Children: 2  . Years of Education: N/A   Occupational History  .     Social History Main Topics  . Smoking status: Never Smoker   . Smokeless tobacco: Never Used     Comment: exposed to smoke during child hood (parents)  . Alcohol Use: No  . Drug Use: No  . Sexual Activity: Yes    Birth Control/ Protection: Surgical, Post-menopausal   Other Topics Concern  . Not on file   Social History Narrative   Lives at home with husband and the dog.     BP 114/68 mmHg  Pulse 82  Ht 5\' 1"  (1.549 m)  Wt 141 lb 9.6 oz (64.229 kg)  BMI 26.77 kg/m2  Physical Exam:  Well appearing 64 yo woman, NAD HEENT: Unremarkable Neck:  6 cm JVD, no thyromegally Back:  No CVA tenderness Lungs:  Clear with no wheezes HEART:  Regular rate rhythm, no murmurs, no rubs, no clicks Abd:  soft, positive bowel sounds, no organomegally, no rebound, no guarding Ext:  2 plus pulses, no edema, no cyanosis, no clubbing Skin:  No rashes no nodules Neuro:  CN II through XII intact, motor grossly  intact   DEVICE  Normal device function.  See PaceArt for details.   Assess/Plan:

## 2015-05-14 ENCOUNTER — Other Ambulatory Visit: Payer: Self-pay | Admitting: *Deleted

## 2015-05-14 DIAGNOSIS — K644 Residual hemorrhoidal skin tags: Secondary | ICD-10-CM

## 2015-05-14 MED ORDER — HYDROCORTISONE 1 % EX OINT: 1.0000 "application " | TOPICAL_OINTMENT | Freq: Two times a day (BID) | CUTANEOUS | Status: DC

## 2015-06-01 ENCOUNTER — Ambulatory Visit: Payer: Medicare Other | Admitting: Internal Medicine

## 2015-06-04 ENCOUNTER — Ambulatory Visit (INDEPENDENT_AMBULATORY_CARE_PROVIDER_SITE_OTHER): Payer: Medicare Other | Admitting: Family Medicine

## 2015-06-04 ENCOUNTER — Encounter: Payer: Self-pay | Admitting: Family Medicine

## 2015-06-04 VITALS — BP 112/44 | HR 84 | Temp 98.7°F | Ht 61.0 in | Wt 140.0 lb

## 2015-06-04 DIAGNOSIS — E118 Type 2 diabetes mellitus with unspecified complications: Secondary | ICD-10-CM | POA: Diagnosis not present

## 2015-06-04 DIAGNOSIS — Z23 Encounter for immunization: Secondary | ICD-10-CM | POA: Diagnosis present

## 2015-06-04 DIAGNOSIS — Z780 Asymptomatic menopausal state: Secondary | ICD-10-CM

## 2015-06-04 DIAGNOSIS — J449 Chronic obstructive pulmonary disease, unspecified: Secondary | ICD-10-CM

## 2015-06-04 DIAGNOSIS — Z13820 Encounter for screening for osteoporosis: Secondary | ICD-10-CM

## 2015-06-04 DIAGNOSIS — J4550 Severe persistent asthma, uncomplicated: Secondary | ICD-10-CM | POA: Diagnosis not present

## 2015-06-04 LAB — POCT GLYCOSYLATED HEMOGLOBIN (HGB A1C): Hemoglobin A1C: 6.8

## 2015-06-04 NOTE — Patient Instructions (Signed)
Osteoporosis Osteoporosis happens when your bones become thinner and weaker. Weak bones can break (fracture) more easily when you slip or fall. Bones most at risk of breaking are in the hip, wrist, and spine. HOME CARE  Get enough calcium and vitamin D. These nutrients are good for your bones.  Exercise as told by your doctor.  Do not use any tobacco products. This includes cigarettes, chewing tobacco, and electronic cigarettes. If you need help quitting, ask your doctor.  Limit the amount of alcohol you drink.  Take medicines only as told by your doctor.  Keep all follow-up visits as told by your doctor. This is important.  Take care at home to prevent falls. Some ways to do this are:  Keep rooms well lit and tidy.  Put safety rails on your stairs.  Put a rubber mat in the bathroom and other places that are often wet or slippery. GET HELP RIGHT AWAY IF:  You fall.  You hurt yourself.   This information is not intended to replace advice given to you by your health care provider. Make sure you discuss any questions you have with your health care provider.   Document Released: 10/10/2011 Document Revised: 08/08/2014 Document Reviewed: 12/26/2013 Elsevier Interactive Patient Education [employer].

## 2015-06-05 NOTE — Progress Notes (Signed)
   Subjective:   Timara Verneda Skill is a 64 y.o. female with a history of DM, CHF, CAD, asthma here for f/u DM  CHRONIC DIABETES  Disease Monitoring  Blood Sugar Ranges: 80-160  Polyuria: no   Visual problems: no   Medication Compliance: yes  Medication Side Effects  Hypoglycemia: no   Preventitive Health Care  Eye Exam: done in feb  Foot Exam: due  Diet pattern: well balanced, low carb  Exercise: regular walking    Review of Systems:  Per HPI. All other systems reviewed and are negative.   PMH, PSH, Medications, Allergies, and FmHx reviewed and updated in EMR.  Social History: never smoker  Objective:  BP 112/44 mmHg  Pulse 84  Temp(Src) 98.7 F (37.1 C) (Oral)  Ht 5\' 1"  (1.549 m)  Wt 140 lb (63.504 kg)  BMI 26.47 kg/m2  Gen:  64 y.o. female in NAD HEENT: NCAT, MMM, EOMI, PERRL, anicteric sclerae CV: RRR, no MRG, no JVD Resp: Non-labored, CTAB, no wheezes noted Abd: Soft, NTND, BS present, no guarding or organomegaly Ext: WWP, no edema MSK: Full ROM, strength intact Neuro: Alert and oriented, speech normal      Chemistry      Component Value Date/Time   NA 142 02/10/2015 1146   K 4.3 02/10/2015 1146   CL 107 02/10/2015 1146   CO2 26 02/10/2015 1146   BUN 17 02/10/2015 1146   CREATININE 0.83 02/10/2015 1146   CREATININE 0.97 01/01/2015 1145      Component Value Date/Time   CALCIUM 9.7 02/10/2015 1146   ALKPHOS 76 02/10/2015 1146   AST 24 02/10/2015 1146   ALT 17 02/10/2015 1146   BILITOT 0.5 02/10/2015 1146      Lab Results  Component Value Date   WBC 6.4 04/23/2015   HGB 13.7 04/23/2015   HCT 40.6 04/23/2015   MCV 88.9 04/23/2015   PLT 210.0 04/23/2015   Lab Results  Component Value Date   TSH 1.50 04/23/2015   Lab Results  Component Value Date   HGBA1C 6.8 06/04/2015   Assessment & Plan:     AERIANA SPEECE is a 64 y.o. female here for DM f/u  Severe persistent asthma with irreversible airway obstruction without complication  ([place]) Followed by Melvyn Novas, pulmonary - flu shot given today (elevated risk for complications) - dexa scan ordered (increased risk for osteoporosis given periodic steroid bursts on top of being a 64yo postmenopausal female) - continue dulera, albuterol prn  Type II diabetes mellitus with complication Well controlled, A1c 6.8, no symptoms, good compliance - continue metformin 500 bid  - discussed ongoing lifestyle changes, good exercise and nutrition practices - eye exam due in February, foot next visit     Beverlyn Roux, MD, MPH [facility] PGY-3 06/05/2015 2:30 PM

## 2015-06-05 NOTE — Assessment & Plan Note (Signed)
Well controlled, A1c 6.8, no symptoms, good compliance - continue metformin 500 bid  - discussed ongoing lifestyle changes, good exercise and nutrition practices - eye exam due in February, foot next visit

## 2015-06-05 NOTE — Assessment & Plan Note (Addendum)
Followed by Melvyn Novas, pulmonary - flu shot given today (elevated risk for complications) - dexa scan ordered (increased risk for osteoporosis given periodic steroid bursts on top of being a 64yo postmenopausal female) - continue dulera, albuterol prn

## 2015-06-17 ENCOUNTER — Encounter: Payer: Self-pay | Admitting: Internal Medicine

## 2015-06-17 ENCOUNTER — Ambulatory Visit (INDEPENDENT_AMBULATORY_CARE_PROVIDER_SITE_OTHER): Payer: Medicare Other | Admitting: Internal Medicine

## 2015-06-17 VITALS — BP 112/64 | HR 86 | Ht 61.0 in | Wt 142.6 lb

## 2015-06-17 DIAGNOSIS — J4530 Mild persistent asthma, uncomplicated: Secondary | ICD-10-CM | POA: Diagnosis not present

## 2015-06-17 MED ORDER — MOMETASONE FUROATE 200 MCG/ACT IN AERO: 2.0000 | INHALATION_SPRAY | Freq: Every day | RESPIRATORY_TRACT | Status: DC

## 2015-06-17 NOTE — Patient Instructions (Signed)
Change dulera 200 1 pffs each am and asmanex 200 2 at bedtime  Work on inhaler technique:  relax and gently blow all the way out then take a nice smooth deep breath back in, triggering the inhaler at same time you start breathing in.  Hold for up to 5 seconds if you can. Blow out thru nose. Rinse and gargle with water when done    Please schedule a follow up visit in 2 months but call sooner if needed

## 2015-06-17 NOTE — Progress Notes (Signed)
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 Advice Only    referred by Dr. Burt Knack for asthma.    used neb alb and ventolin but > 4h  prior to OV  Last steroids > 3 weeks prior to OV  And worse since finished , esp at hs  With cough/ wheeze rec Start dulera 200 Take 2 puffs first thing in am and then another 2 puffs about 12 hours later.  Work on inhaler technique:   Only use your albuterol (ventolin) as a rescue Only use your nebulizer if you try the ventolin first and it doesn't work Continue Pantoprazole (protonix) 40 mg but be sure to  Take  30-60 min before first meal of the day and Zantac 150 mg one @  bedtime GERD (REFLUX)     Please remember to go to the x-ray department downstairs for your tests - we will call you with the results when they are available.      02/17/2015 f/u ov/[facility] re: asthma   Chief Complaint  Patient presents with  . Follow-up    PFT today.  Breathing doing great. only complaint today is leg cramps from feet to knees.  much better on dulera 200 2 bid and no need for saba but noct muscle cramps have developed on it rec Try dulera 200 2puffs every am and pm dose is optional  Work on inhaler technique:  Please schedule a follow up visit in 3 months but call sooner if needed     06/17/2015  f/u ov/[facility] re: asthma > ok on dulera 200 one bid s need for saba  Chief Complaint  Patient  presents with  . Follow-up    severe asthma. pt. states breathing has improved. occ. SOB. denies wheezing,cough or chest  pain/tightness.  Not limited by breathing from desired activities    No obvious day to day or daytime variability or assoc chronic cough or cp or chest tightness, subjective wheeze or overt sinus or hb symptoms. No unusual exp hx or h/o childhood pna/ asthma or knowledge of premature birth.  Sleeping ok without nocturnal  or early am exacerbation  of respiratory  c/o's or need for noct saba. Also denies any obvious fluctuation of symptoms with weather or environmental changes or other aggravating or alleviating factors except as outlined above   Current Medications, Allergies, Complete Past Medical History, Past Surgical History, Family History, and Social History were reviewed in [employer] record.  ROS  The following are not active complaints unless bolded sore throat, dysphagia, dental problems, itching, sneezing,  nasal congestion or excess/ purulent secretions, ear ache,   fever, chills, sweats, unintended wt loss, classically pleuritic or exertional cp, hemoptysis,  orthopnea pnd or leg swelling, presyncope, palpitations, abdominal pain, anorexia, nausea, vomiting, diarrhea  or change in bowel or bladder habits, change in stools or urine, dysuria,hematuria,  rash, arthralgias, visual complaints, headache, numbness, weakness or ataxia or problems with walking or coordination,  change in mood/affect or memory.           Objective:   Physical Exam  amb bf  nad   02/17/2015       138  > 06/17/2015   143  Wt Readings from Last 3 Encounters:  01/05/15 137 lb (62.143 kg)  12/22/14 137 lb (62.143 kg)  12/17/14 138 lb (62.596 kg)    Vital signs reviewed  HEENT: nl dentition, turbinates, and orophanx. Nl external ear canals without cough reflex   NECK :  without JVD/Nodes/TM/ nl carotid upstrokes bilaterally   LUNGS: no acc muscle use, clear  to A and P bilaterally without cough on insp or exp maneuvers   CV:  RRR  no s3 or murmur or increase in P2, no edema   ABD:  soft and nontender with nl excursion in the supine position. No bruits or organomegaly, bowel sounds nl  MS:  warm without deformities, calf tenderness, cyanosis or clubbing  SKIN: warm and dry without lesions    NEURO:  alert, approp, no deficits      CXR PA and Lateral:   01/05/2015 :     I personally reviewed images and agree with radiology impression as follows:    No active cardiopulmonary disease.      Assessment & Plan:

## 2015-06-18 ENCOUNTER — Encounter: Payer: Self-pay | Admitting: Internal Medicine

## 2015-06-18 DIAGNOSIS — J4530 Mild persistent asthma, uncomplicated: Secondary | ICD-10-CM | POA: Insufficient documentation

## 2015-06-18 NOTE — Assessment & Plan Note (Addendum)
-  Spirometry 01/05/15  FEV1  0.98 (46%) ratio 62  - 01/05/2015 p extensive coaching HFA effectiveness =    50% > try dulera 200 2bid - PFTs 02/17/15  FEV1  1.97 ( 93%) ratio 79 p 17% improvement from saba and dlco 107    All goals of chronic asthma control met including optimal function and elimination of symptoms with minimal need for rescue therapy.  Contingencies discussed in full including contacting this office immediately if not controlling the symptoms using the rule of two's.     Will try step down to asmanex using 2 pffs at hs to avoid cramping she gets from dulera and just one one pff of the duler each am  The proper method of use, as well as anticipated side effects, of a metered-dose inhaler are discussed and demonstrated to the patient. Improved effectiveness after extensive coaching during this visit to a level of approximately  90%  I had an extended discussion with the patient reviewing all relevant studies completed to date and  lasting 15 to 20 minutes of a 25 minute visit    Each maintenance medication was reviewed in detail including most importantly the difference between maintenance and prns and under what circumstances the prns are to be triggered using an action plan format that is not reflected in the computer generated alphabetically organized AVS.    Please see instructions for details which were reviewed in writing and the patient given a copy highlighting the part that I personally wrote and discussed at today's ov.

## 2015-06-30 ENCOUNTER — Telehealth: Payer: Self-pay | Admitting: *Deleted

## 2015-06-30 NOTE — Telephone Encounter (Signed)
Received fax from [facility] requesting clarification on Valsartan dosage 40 mg vs 80 mg. Form placed in provider box.

## 2015-07-01 ENCOUNTER — Other Ambulatory Visit: Payer: Self-pay | Admitting: Family Medicine

## 2015-07-01 ENCOUNTER — Telehealth: Payer: Self-pay | Admitting: Internal Medicine

## 2015-07-01 DIAGNOSIS — Z780 Asymptomatic menopausal state: Secondary | ICD-10-CM

## 2015-07-01 DIAGNOSIS — J410 Simple chronic bronchitis: Secondary | ICD-10-CM

## 2015-07-01 DIAGNOSIS — E2839 Other primary ovarian failure: Secondary | ICD-10-CM

## 2015-07-01 MED ORDER — ALBUTEROL SULFATE HFA 108 (90 BASE) MCG/ACT IN AERS: 2.0000 | INHALATION_SPRAY | Freq: Four times a day (QID) | RESPIRATORY_TRACT | Status: DC | PRN

## 2015-07-01 NOTE — Telephone Encounter (Signed)
Called and spoke to pt. Pt is requesting an albuterol hfa refill. Rx sent to preferred pharmacy. Pt verbalized understanding and denied any further questions or concerns at this time.

## 2015-07-02 ENCOUNTER — Other Ambulatory Visit: Payer: Medicare Other

## 2015-07-02 ENCOUNTER — Ambulatory Visit
Admission: RE | Admit: 2015-07-02 | Discharge: 2015-07-02 | Disposition: A | Discharge status: Home / Self Care | Payer: Medicare Other | Source: Ambulatory Visit | Attending: Family Medicine | Admitting: Family Medicine

## 2015-07-02 DIAGNOSIS — M85852 Other specified disorders of bone density and structure, left thigh: Secondary | ICD-10-CM | POA: Diagnosis not present

## 2015-07-02 DIAGNOSIS — Z780 Asymptomatic menopausal state: Secondary | ICD-10-CM

## 2015-07-02 DIAGNOSIS — E2839 Other primary ovarian failure: Secondary | ICD-10-CM

## 2015-07-03 ENCOUNTER — Other Ambulatory Visit: Payer: Self-pay | Admitting: Family Medicine

## 2015-07-03 ENCOUNTER — Telehealth: Payer: Self-pay | Admitting: *Deleted

## 2015-07-03 DIAGNOSIS — I5022 Chronic systolic (congestive) heart failure: Secondary | ICD-10-CM

## 2015-07-03 NOTE — Telephone Encounter (Signed)
Patient verified DOB Patient called to obtain results from a procedure performed on 07/01/05. Patient was informed of no encounter showing in the system Medical Assistant advised patient to return a phone call mid-next week which will give all parties enough time to communicate and review the results. Patient had no further questions.

## 2015-07-05 ENCOUNTER — Other Ambulatory Visit: Payer: Self-pay | Admitting: Family Medicine

## 2015-07-05 DIAGNOSIS — M792 Neuralgia and neuritis, unspecified: Secondary | ICD-10-CM

## 2015-07-08 NOTE — Telephone Encounter (Signed)
Patient called once more about this request. She states that she would like to know the results of her bone density test completed. Please contact patient at the earliest convenience to inform her of results. Thank you, Fonda Kinder, ASA

## 2015-07-10 ENCOUNTER — Telehealth: Payer: Self-pay | Admitting: *Deleted

## 2015-07-10 ENCOUNTER — Other Ambulatory Visit: Payer: Self-pay | Admitting: Family Medicine

## 2015-07-10 DIAGNOSIS — K644 Residual hemorrhoidal skin tags: Secondary | ICD-10-CM | POA: Diagnosis not present

## 2015-07-10 DIAGNOSIS — R197 Diarrhea, unspecified: Secondary | ICD-10-CM | POA: Diagnosis not present

## 2015-07-10 DIAGNOSIS — M8580 Other specified disorders of bone density and structure, unspecified site: Secondary | ICD-10-CM

## 2015-07-10 MED ORDER — CALCIUM CARB-CHOLECALCIFEROL 600-800 MG-UNIT PO TABS: 1.0000 | ORAL_TABLET | Freq: Every day | ORAL | Status: DC

## 2015-07-10 NOTE — Telephone Encounter (Signed)
Patient verified DOB Patient has been calling since 07/03/15 to obtain Bone Density Results. Medical Assistant can see results in the system from the performing provider.  Could someone please follow-up with the patient and inform her of the results as soon as possible.

## 2015-07-11 NOTE — Telephone Encounter (Signed)
Returned call and gave pt result, osteopenia. Rec weight-bearing exercise as able and calc/vit d supplement called in.

## 2015-07-14 DIAGNOSIS — R197 Diarrhea, unspecified: Secondary | ICD-10-CM | POA: Diagnosis not present

## 2015-08-04 ENCOUNTER — Ambulatory Visit (INDEPENDENT_AMBULATORY_CARE_PROVIDER_SITE_OTHER): Payer: Medicare Other | Admitting: *Deleted

## 2015-08-04 DIAGNOSIS — I429 Cardiomyopathy, unspecified: Secondary | ICD-10-CM | POA: Diagnosis not present

## 2015-08-04 DIAGNOSIS — I428 Other cardiomyopathies: Secondary | ICD-10-CM

## 2015-08-05 NOTE — Progress Notes (Signed)
Remote ICD transmission.   

## 2015-08-11 LAB — CUP PACEART REMOTE DEVICE CHECK
Battery Remaining Longevity: 120 mo
Battery Voltage: 3.01 V
Brady Statistic AP VP Percent: 0 %
Brady Statistic AP VS Percent: 1.08 %
Brady Statistic AS VP Percent: 0.05 %
Brady Statistic AS VS Percent: 98.87 %
Brady Statistic RA Percent Paced: 1.08 %
Brady Statistic RV Percent Paced: 0.06 %
Date Time Interrogation Session: 20170103051705
HighPow Impedance: 66 Ohm
Implantable Lead Implant Date: 20150427
Implantable Lead Implant Date: 20150427
Implantable Lead Location: 753859
Implantable Lead Location: 753860
Implantable Lead Model: 5076
Implantable Lead Model: 6935
Lead Channel Impedance Value: 399 Ohm
Lead Channel Impedance Value: 4047 Ohm
Lead Channel Impedance Value: 4047 Ohm
Lead Channel Impedance Value: 4047 Ohm
Lead Channel Impedance Value: 551 Ohm
Lead Channel Impedance Value: 646 Ohm
Lead Channel Pacing Threshold Amplitude: 0.5 V
Lead Channel Pacing Threshold Amplitude: 0.625 V
Lead Channel Pacing Threshold Pulse Width: 0.4 ms
Lead Channel Pacing Threshold Pulse Width: 0.4 ms
Lead Channel Sensing Intrinsic Amplitude: 1.75 mV
Lead Channel Sensing Intrinsic Amplitude: 1.75 mV
Lead Channel Sensing Intrinsic Amplitude: 14.75 mV
Lead Channel Sensing Intrinsic Amplitude: 14.75 mV
Lead Channel Setting Pacing Amplitude: 2 V
Lead Channel Setting Pacing Amplitude: 2.5 V
Lead Channel Setting Pacing Pulse Width: 0.4 ms
Lead Channel Setting Sensing Sensitivity: 0.3 mV

## 2015-08-12 ENCOUNTER — Encounter: Payer: Self-pay | Admitting: Cardiology

## 2015-08-17 ENCOUNTER — Ambulatory Visit (INDEPENDENT_AMBULATORY_CARE_PROVIDER_SITE_OTHER): Payer: Medicare Other | Admitting: Family Medicine

## 2015-08-17 ENCOUNTER — Encounter: Payer: Self-pay | Admitting: Family Medicine

## 2015-08-17 VITALS — BP 130/58 | HR 99 | Temp 98.6°F | Wt 141.3 lb

## 2015-08-17 DIAGNOSIS — R300 Dysuria: Secondary | ICD-10-CM

## 2015-08-17 LAB — COMPLETE METABOLIC PANEL WITH GFR
ALT: 14 U/L (ref 6–29)
AST: 20 U/L (ref 10–35)
Albumin: 4.1 g/dL (ref 3.6–5.1)
Alkaline Phosphatase: 101 U/L (ref 33–130)
BUN: 16 mg/dL (ref 7–25)
CO2: 28 mmol/L (ref 20–31)
Calcium: 10 mg/dL (ref 8.6–10.4)
Chloride: 103 mmol/L (ref 98–110)
Creat: 1.03 mg/dL — ABNORMAL HIGH (ref 0.50–0.99)
GFR, Est African American: 65 mL/min (ref >=60)
GFR, Est Non African American: 57 mL/min — ABNORMAL LOW (ref >=60)
Glucose, Bld: 149 mg/dL — ABNORMAL HIGH (ref 65–99)
Potassium: 4.3 mmol/L (ref 3.5–5.3)
Sodium: 140 mmol/L (ref 135–146)
Total Bilirubin: 0.4 mg/dL (ref 0.2–1.2)
Total Protein: 7 g/dL (ref 6.1–8.1)

## 2015-08-17 LAB — CBC
HCT: 38.6 % (ref 36.0–46.0)
Hemoglobin: 13 g/dL (ref 12.0–15.0)
MCH: 29.6 pg (ref 26.0–34.0)
MCHC: 33.7 g/dL (ref 30.0–36.0)
MCV: 87.9 fL (ref 78.0–100.0)
MPV: 13 fL — ABNORMAL HIGH (ref 8.6–12.4)
Platelets: 218 10*3/uL (ref 150–400)
RBC: 4.39 MIL/uL (ref 3.87–5.11)
RDW: 12.9 % (ref 11.5–15.5)
WBC: 6.8 10*3/uL (ref 4.0–10.5)

## 2015-08-17 LAB — POCT URINALYSIS DIPSTICK
Bilirubin, UA: NEGATIVE
Blood, UA: NEGATIVE
Glucose, UA: NEGATIVE
Ketones, UA: NEGATIVE
Nitrite, UA: NEGATIVE
Protein, UA: NEGATIVE
Spec Grav, UA: 1.015
Urobilinogen, UA: 0.2
pH, UA: 5.5

## 2015-08-17 LAB — POCT UA - MICROSCOPIC ONLY

## 2015-08-17 MED ORDER — TRIAMCINOLONE ACETONIDE 0.025 % EX CREA: 1.0000 "application " | TOPICAL_CREAM | Freq: Two times a day (BID) | CUTANEOUS | Status: DC

## 2015-08-17 NOTE — Patient Instructions (Signed)
It was a pleasure seeing you today in our clinic. Today we discussed your dark urine. Here is the treatment plan we have discussed and agreed upon together:   - Today everything appears to be back to normal. Your urinalysis was not concerning at this time. - We were able to draw blood tests today and I will mail you the results of these when they come back. I will forward all laboratory results to Dr. Sherril Cong - I refilled your hydrocortisone cream. - Regarding your rosacea, you may notice a significant improvement with some sun exposure. An afternoon in the sun may help clear up some of this rash.

## 2015-08-17 NOTE — Progress Notes (Signed)
Intermittent right flank pain. Not present today. No fevers/chills. No pain w/ urination.    HPI  CC: Follow-up Patient is here for a same-day appointments to follow-up on an episode of dark urine and dysuria back in July of this year. Patient states that she is asked to follow-up on this with her PCP after a couple weeks but never did. She states that she has not had any dark colored urine since that time. She reports some intermittent right flank pain but no symptoms today she denies any recent fevers or chills. No dysuria, hematuria, or odor at this time.  Review of Systems   See HPI for ROS. All other systems reviewed and are negative.  Past medical history and social history reviewed and updated in the EMR as appropriate.  Objective: BP 130/58 mmHg  Pulse 99  Temp(Src) 98.6 F (37 C) (Oral)  Wt 141 lb 4.8 oz (64.093 kg) Gen: NAD, alert, cooperative CV: RRR, no murmur Resp: CTAB, no wheezes, non-labored Abd: SNTND, BS present, no guarding or organomegaly, no CVA tenderness Ext: No edema, warm   Assessment and plan:  Dysuria Patient is here for follow-up on dysuria and hematuria from 6 months ago. She denies any persistent symptoms at this time. She endorses some intermittent flank pain that does not currently have any pain at this time. Urinalysis was unremarkable at this time. Etiology for previous presentation in July is currently unknown however due to the results of the urine patient appeared to have a significant amount of protein being lost to her urine as well as an elevated bilirubin. This was not seen on her urinalysis taken today. - No alteration to her medical management at this time. - Encouraged follow-up with her PCP if any symptoms recur, or if her right flank pain persists. - Encouraged avoidance of all NSAIDs. - CMP and CBC obtained today; pending.    Orders Placed This Encounter  Procedures  . CBC  . COMPLETE METABOLIC PANEL WITH GFR  . Urinalysis  Dipstick  . POCT UA - Microscopic Only    Meds ordered this encounter  Medications  . triamcinolone (KENALOG) 0.025 % cream    Sig: Apply 1 application topically 2 (two) times daily.    Dispense:  30 g    Refill:  0     Elberta Leatherwood, MD,MS,  PGY2 08/19/2015 5:56 PM

## 2015-08-19 ENCOUNTER — Ambulatory Visit (INDEPENDENT_AMBULATORY_CARE_PROVIDER_SITE_OTHER): Payer: Medicare Other | Admitting: Internal Medicine

## 2015-08-19 ENCOUNTER — Encounter: Payer: Self-pay | Admitting: Internal Medicine

## 2015-08-19 ENCOUNTER — Encounter: Payer: Self-pay | Admitting: Family Medicine

## 2015-08-19 VITALS — BP 108/64 | HR 87 | Ht 61.0 in | Wt 143.6 lb

## 2015-08-19 DIAGNOSIS — R05 Cough: Secondary | ICD-10-CM | POA: Diagnosis not present

## 2015-08-19 DIAGNOSIS — J4530 Mild persistent asthma, uncomplicated: Secondary | ICD-10-CM

## 2015-08-19 DIAGNOSIS — R058 Other specified cough: Secondary | ICD-10-CM

## 2015-08-19 DIAGNOSIS — R300 Dysuria: Secondary | ICD-10-CM | POA: Insufficient documentation

## 2015-08-19 LAB — NITRIC OXIDE: Nitric Oxide: 70

## 2015-08-19 NOTE — Patient Instructions (Signed)
Pantoprazole (protonix) 40 mg   Take  30-60 min before first meal of the day and zantac 150  mg one @  bedtime until return to office - this is the best way to tell whether stomach acid is contributing to your problem.    GERD (REFLUX)  is an extremely common cause of respiratory symptoms just like yours , many times with no obvious heartburn at all.    It can be treated with medication, but also with lifestyle changes including elevation of the head of your bed (ideally with 6 inch  bed blocks),  Smoking cessation, avoidance of late meals, excessive alcohol, and avoid fatty foods, chocolate, peppermint, colas, red wine, and acidic juices such as orange juice.  NO MINT OR MENTHOL PRODUCTS SO NO COUGH DROPS  USE SUGARLESS CANDY INSTEAD (Jolley ranchers or Stover's or Life Savers) or even ice chips will also do - the key is to swallow to prevent all throat clearing. NO OIL BASED VITAMINS - use powdered substitutes.   Change gabapentin 100 mg twice daily automatically    For drainage / throat tickle try take CHLORPHENIRAMINE  4 mg - take one every 4 hours as needed - available over the counter- may cause drowsiness so start with just a bedtime dose or two and see how you tolerate it before trying in daytime    See Tammy NP 4 weeks with all your medications, even over the counter meds, separated in two separate bags, the ones you take no matter what vs the ones you stop once you feel better and take only as needed when you feel you need them.   Tammy  will generate for you a new user friendly medication calendar that will put [place] all on the same page re: your medication use.     Without this process, it simply isn't possible to assure that we are providing  your outpatient care  with  the attention to detail we feel you deserve.   If we cannot assure that you're getting that kind of care,  then we cannot manage your problem effectively from this clinic.  Once you have seen Tammy and we are sure that  we're all on the same page with your medication use she will arrange follow up with me.

## 2015-08-19 NOTE — Progress Notes (Signed)
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 Advice Only    referred by Dr. Burt Knack for asthma.    used neb alb and ventolin but > 4h  prior to OV  Last steroids > 3 weeks prior to OV  And worse since finished , esp at hs  With cough/ wheeze rec Start dulera 200 Take 2 puffs first thing in am and then another 2 puffs about 12 hours later.  Work on inhaler technique:   Only use your albuterol (ventolin) as a rescue Only use your nebulizer if you try the ventolin first and it doesn't work Continue Pantoprazole (protonix) 40 mg but be sure to  Take  30-60 min before first meal of the day and Zantac 150 mg one @  bedtime GERD diet         02/17/2015 f/u ov/Veronica Shaw re: asthma   Chief Complaint  Patient presents with  . Follow-up    PFT today.  Breathing doing great. only complaint today is leg cramps from feet to knees.  much better on dulera 200 2 bid and no need for saba but noct muscle cramps have developed on it rec Try dulera 200 2puffs every am and pm dose is optional  Work on inhaler technique:     06/17/2015  f/u ov/Veronica Shaw re: asthma > ok on dulera 200 one bid s need for saba  Chief Complaint  Patient presents with  . Follow-up    severe asthma. pt. states breathing has improved. occ. SOB. denies wheezing,cough or chest  pain/tightness.  Not limited by breathing from desired activities  rec Change dulera  200 1 pffs each am and asmanex 200 2 at bedtime Work on inhaler technique:       08/19/2015  Extended ov/Veronica Shaw re: asthma with uacs component dulera 200 one pff/ asthmanex 2 at hs  Chief Complaint  Patient presents with  . Follow-up    Pt states that her breathing has improved. She c/o "too much phlem" bothers her most at night.   bad cough since last ov > green mucus > mucinex / abx from home > resolved the purulent sputum but not the cough  Main problem when head hits pillow cough gets worse with sense of pnds but no excess mucus production now Doe x  R leg stops her first   Has gabapentin uses prn  Very confused with details of care  No obvious day to day or daytime variability or assoc excess/ purulent sputum or mucus plugs (since she took her abx) or hemoptysis or cp or chest tightness, subjective wheeze or overt sinus or hb symptoms. No unusual exp hx or h/o childhood pna/ asthma or knowledge of premature birth.  Sleeping ok without nocturnal  or early am exacerbation  of respiratory  c/o's  or need for noct saba. Also denies any obvious fluctuation of symptoms with weather or environmental changes or other aggravating or alleviating factors except as outlined above   Current Medications, Allergies, Complete Past Medical History, Past Surgical History, Family History, and Social History were reviewed in [employer] record.  ROS  The following are not active complaints unless bolded sore throat, dysphagia, dental problems, itching, sneezing,  nasal congestion or excess/ purulent secretions, ear ache,   fever, chills, sweats, unintended wt loss, classically pleuritic or exertional cp,  orthopnea pnd or leg swelling, presyncope, palpitations, abdominal pain, anorexia, nausea, vomiting, diarrhea  or change in bowel or bladder habits, change in stools or urine, dysuria,hematuria,  rash, arthralgias, visual complaints, headache, numbness, weakness or ataxia or problems with  walking or coordination,  change in mood/affect or memory.            Objective:   Physical Exam  amb bf  nad who failed to answer a single question asked in a straightforward manner, tending to go off on tangents or answer questions with ambiguous medical terms (" I got bronchitis") or diagnoses and seemed perplexed when asked the same question more than once for clarification.   02/17/2015       138  > 06/17/2015   143 >  08/19/2015 144     01/05/15 137 lb (62.143 kg)  12/22/14 137 lb (62.143 kg)  12/17/14 138 lb (62.596 kg)    Vital signs reviewed  HEENT: nl dentition, turbinates, and orophanx. Nl external ear canals without cough reflex   NECK :  without JVD/Nodes/TM/ nl carotid upstrokes bilaterally   LUNGS: no acc muscle use, clear to A and P bilaterally without cough on insp or exp maneuvers   CV:  RRR  no s3 or murmur or increase in P2, no edema   ABD:  soft and nontender with nl excursion in the supine position. No bruits or organomegaly, bowel sounds nl  MS:  warm without deformities, calf tenderness, cyanosis or clubbing  SKIN: warm and dry without lesions    NEURO:  alert, approp, no deficits      CXR PA and Lateral:   01/05/2015 :     I personally reviewed images and agree with radiology impression as follows:    No active cardiopulmonary disease.      Assessment & Plan:

## 2015-08-19 NOTE — Assessment & Plan Note (Signed)
Patient is here for follow-up on dysuria and hematuria from 6 months ago. She denies any persistent symptoms at this time. She endorses some intermittent flank pain that does not currently have any pain at this time. Urinalysis was unremarkable at this time. Etiology for previous presentation in July is currently unknown however due to the results of the urine patient appeared to have a significant amount of protein being lost to her urine as well as an elevated bilirubin. This was not seen on her urinalysis taken today. - No alteration to her medical management at this time. - Encouraged follow-up with her PCP if any symptoms recur, or if her right flank pain persists. - Encouraged avoidance of all NSAIDs. - CMP and CBC obtained today; pending.

## 2015-08-20 ENCOUNTER — Encounter: Payer: Self-pay | Admitting: Internal Medicine

## 2015-08-20 DIAGNOSIS — R058 Other specified cough: Secondary | ICD-10-CM | POA: Insufficient documentation

## 2015-08-20 DIAGNOSIS — R05 Cough: Secondary | ICD-10-CM | POA: Insufficient documentation

## 2015-08-20 NOTE — Assessment & Plan Note (Addendum)
-  Spirometry 01/05/15  FEV1  0.98 (46%) ratio 62  - 01/05/2015 p extensive coaching HFA effectiveness =    50% > try dulera 200 2bid - PFTs 02/17/15  FEV1  1.97 ( 93%) ratio 79 p 17% improvement from saba and dlco 107  - 08/19/2015  Walked RA x 3 laps @ 185 ft each stopped due to End of study, nl pace, no sob or desat   - NO 08/19/2015 = 79  - Spirometry 08/19/2015  FEV1 1.32 (65%)  Ratio 71 p am dulera 200 x one puff   - The proper method of use, as well as anticipated side effects, of a metered-dose inhaler are discussed and demonstrated to the patient. Improved effectiveness after extensive coaching during this visit to a level of approximately 75 % from a baseline of 50 %   Despite suboptimal hfa/ All goals of chronic asthma control met including optimal function and elimination of symptoms with minimal need for rescue therapy.  Contingencies discussed in full including contacting this office immediately if not controlling the symptoms using the rule of two's.    I had an extended discussion with the patient reviewing all relevant studies completed to date and  lasting  25 minutes of a 40 minute visit because of her failure to be forthcoming with straight answers to questions re symptoms and sorting thru each of them individually by asked repeated questions for clarification.  Agreed to goal of simplifying care if possible as some of her symptoms may be from confusion with meds.    Each maintenance medication was reviewed in detail including most importantly the difference between maintenance and prns and under what circumstances the prns are to be triggered using an action plan format that is not reflected in the computer generated alphabetically organized AVS.    Please see instructions for details which were reviewed in writing and the patient given a copy highlighting the part that I personally wrote and discussed at today's ov.

## 2015-08-20 NOTE — Assessment & Plan Note (Signed)
Classic Upper airway cough syndrome, so named because it's frequently impossible to sort out how much is  CR/sinusitis with freq throat clearing (which can be related to primary GERD)   vs  causing  secondary (" extra esophageal")  GERD from wide swings in gastric pressure that occur with throat clearing, often  promoting self use of mint and menthol lozenges that reduce the lower esophageal sphincter tone and exacerbate the problem further in a cyclical fashion.   These are the same pts (now being labeled as having "irritable larynx syndrome" by some cough centers) who not infrequently have a history of having failed to tolerate ace inhibitors,  dry powder inhalers or biphosphonates or report having atypical reflux symptoms that don't respond to standard doses of PPI , and are easily confused as having aecopd or asthma flares by even experienced allergists/ pulmonologists.   For now rec Trial of hs h1 h2 and gabapentin 100 bid and then return for med reconciliation before we go further   To keep things simple, I have asked the patient to first separate medicines that are perceived as maintenance, that is to be taken daily "no matter what", from those medicines that are taken on only on an as-needed basis and I have given the patient examples of both, and then return to see our NP to generate a  detailed  medication calendar which should be followed until the next physician sees the patient and updates it.

## 2015-08-23 NOTE — Progress Notes (Signed)
Cardiology Office Note:    Date:  08/24/2015   ID:  Veronica Shaw, DOB Oct 13, 1950, MRN [id number]  PCP:  Beverlyn Roux, MD  Cardiologist:  Dr. Sherren Mocha   Electrophysiologist:  Dr. Cristopher Peru   Chief Complaint  Patient presents with  . Congestive Heart Failure    Follow Up    History of Present Illness:    Veronica Shaw is a 65 y.o. female with a hx of nonischemic cardiomyopathy, systolic CHF, s/p AICD 0000000, diabetes, HL, sleep apnea, asthma.She saw Dr. Burt Knack 9/16. She had held valsartan on her own secondary to dizziness but had no improvement. She was asked to resume this.  Last seen by Dr. Cristopher Peru in 10/16.  She returns for FU.  Overall, she has been doing well. Over the last several days, she has noticed a 5 weight gain as well as increasing abdominal girth and increasing dyspnea with exertion. She sleeps on an incline chronically without change. She denies PND or LE edema. She has some discomfort in her chest that she has had in the past associated with volume excess. She denies syncope. She continues to have dizziness with changes in head position. She describes this more as a spinning sensation.   Past Medical History  Diagnosis Date  . Sleep apnea     No CPAP  . Gastritis   . Plantar fasciitis   . Diabetes mellitus   . Hyperlipidemia   . Asthma   . Urticaria   . Parkinson disease ([place])   . CHF (congestive heart failure) [facility])     Past Surgical History  Procedure Laterality Date  . Tubal ligation    . Cholecystectomy    . Bunionectomy    . Implantable cardioverter defibrillator implant  11-25-13    MDT dual chamber ICD implanted by [name] for primary prevention  . Left and right heart catheterization with coronary angiogram N/A 05/31/2013    Procedure: LEFT AND RIGHT HEART CATHETERIZATION WITH CORONARY ANGIOGRAM;  Surgeon: [name], MD;  Location: [facility] CATH LAB;  Service: Cardiovascular;  Laterality: N/A;  . Implantable cardioverter  defibrillator implant N/A 11/25/2013    Procedure: IMPLANTABLE CARDIOVERTER DEFIBRILLATOR IMPLANT;  Surgeon: [name], MD;  Location: [facility] CATH LAB;  Service: Cardiovascular;  Laterality: N/A;    Current Medications: Outpatient Prescriptions Prior to Visit  Medication Sig Dispense Refill  . albuterol (PROVENTIL HFA;VENTOLIN HFA) 108 (90 BASE) MCG/ACT inhaler Inhale 2 puffs into the lungs every 6 (six) hours as needed for wheezing. 1 Inhaler 5  . albuterol (PROVENTIL) (2.5 MG/3ML) 0.083% nebulizer solution Take 3 mLs (2.5 mg total) by nebulization 2 (two) times daily. Dx 493.9 75 mL 2  . Calcium Carb-Cholecalciferol 600-800 MG-UNIT TABS Take 1 tablet by mouth daily. 90 tablet 3  . cyclobenzaprine (FLEXERIL) 5 MG tablet Take 1 tablet (5 mg total) by mouth 3 (three) times daily as needed for muscle spasms. 30 tablet 1  . EPINEPHrine (EPIPEN) 0.3 mg/0.3 mL SOAJ injection Inject 0.3 mg into the muscle as needed (allergic reaction).    [name] Kitchen erythromycin ophthalmic ointment Place 1 application into both eyes as needed (For eye irritation).     . furosemide (LASIX) 40 MG tablet Take 20 mg by mouth daily as needed for fluid (For swelling).     . gabapentin (NEURONTIN) 100 MG capsule TAKE 1 TO 2 CAPSULES (100MG  TO 200MG ) AT BEDTIME AS NEEDED FOR  MUSCLE  PAIN 60 capsule 11  . hydrocortisone (ANUSOL-HC) 25  MG suppository Place 1 suppository (25 mg total) rectally 2 (two) times daily. 12 suppository 0  . hydrocortisone 1 % ointment Apply 1 application topically 2 (two) times daily. 30 g 3  . ipratropium (ATROVENT) 0.06 % nasal spray Place 1 spray into both nostrils 2 (two) times daily as needed for rhinitis.     . meloxicam (MOBIC) 7.5 MG tablet Take 7.5 mg by mouth daily as needed for pain.    . metFORMIN (GLUCOPHAGE) 500 MG tablet TAKE TWO TABLETS BY MOUTH TWICE DAILY WITH A MEAL 360 tablet 4  . metroNIDAZOLE (METROGEL) 1 % gel Apply 1 application topically daily. Apply to rosacea    . Mometasone Furoate  (ASMANEX HFA) 200 MCG/ACT AERO Inhale 2 puffs into the lungs at bedtime.    . mometasone-formoterol (DULERA) 100-5 MCG/ACT AERO Inhale 1 puff into the lungs daily.    . nitroGLYCERIN (NITROSTAT) 0.4 MG SL tablet Place 1 tablet (0.4 mg total) under the tongue every 5 (five) minutes as needed for chest pain. Take one tablet and place under tongue if develop chest pain may repeat x2 25 tablet 3  . pantoprazole (PROTONIX) 40 MG tablet Take 1 tablet (40 mg total) by mouth daily. 30 tablet 0  . potassium chloride (K-DUR) 10 MEQ tablet Take 1 tablet (10 mEq total) by mouth daily. 60 tablet 6  . spironolactone (ALDACTONE) 25 MG tablet TAKE 1 TABLET EVERY DAY 90 tablet 3  . triamcinolone (KENALOG) 0.025 % cream Apply 1 application topically 2 (two) times daily. 30 g 0  . valsartan (DIOVAN) 40 MG tablet Take 1 tablet (40 mg total) by mouth daily. 90 tablet 2  . metoprolol succinate (TOPROL-XL) 50 MG 24 hr tablet Take 1 tablet (50 mg total) by mouth daily. 90 tablet 4  . carbidopa-levodopa (SINEMET) 25-100 MG per tablet Take 1 tablet by mouth 3 (three) times daily. For parkinsons     No facility-administered medications prior to visit.     Allergies:   Aspirin; Penicillins; Prempro; Ace inhibitors; and Simvastatin   Social History   Social History  . Marital Status: Married    Spouse Name: N/A  . Number of Children: 2  . Years of Education: N/A   Occupational History  .     Social History Main Topics  . Smoking status: Never Smoker   . Smokeless tobacco: Never Used     Comment: exposed to smoke during child hood (parents)  . Alcohol Use: No  . Drug Use: No  . Sexual Activity: Yes    Birth Control/ Protection: Surgical, Post-menopausal   Other Topics Concern  . None   Social History Narrative   Lives at home with husband and the dog.     Family History:  The patient's family history includes Coronary artery disease in her father and mother; Diabetes in her father and mother; Heart  attack in her father and mother; Stroke in her mother.   ROS:   Please see the history of present illness.    Review of Systems  Constitution: Positive for weight gain.  Cardiovascular: Positive for dyspnea on exertion.  Respiratory: Positive for snoring.   Musculoskeletal: Positive for myalgias.  Neurological: Positive for dizziness and loss of balance.  All other systems reviewed and are negative.   Physical Exam:    VS:  BP 110/54 mmHg  Pulse 86  Ht 5\' 1"  (1.549 m)  Wt 144 lb 12.8 oz (65.681 kg)  BMI 27.37 kg/m2   GEN: Well nourished,  well developed, in no acute distress HEENT: normal Neck: no JVD, no masses Cardiac: Normal S1/S2, RRR; no murmurs  Respiratory:  clear to auscultation bilaterally; no wheezing, rhonchi or rales GI: soft, nontender, nondistended, + BS MS: no deformity or atrophy Skin: warm and dry, no rash Neuro:   no focal deficits  Psych: Alert and oriented x 3, normal affect  Wt Readings from Last 3 Encounters:  08/24/15 144 lb 12.8 oz (65.681 kg)  08/19/15 143 lb 9.6 oz (65.137 kg)  08/17/15 141 lb 4.8 oz (64.093 kg)      Studies/Labs Reviewed:    EKG:  EKG is not ordered today.  The ekg ordered today demonstrates n/a  Recent Labs: 12/09/2014: Pro B Natriuretic peptide (BNP) 52.0 04/23/2015: TSH 1.50 08/17/2015: ALT 14; BUN 16; Creat 1.03*; Hemoglobin 13.0; Platelets 218; Potassium 4.3; Sodium 140   Recent Lipid Panel    Component Value Date/Time   CHOL 172 05/31/2013 0435   TRIG 142 05/31/2013 0435   HDL 49 05/31/2013 0435   CHOLHDL 3.5 05/31/2013 0435   VLDL 28 05/31/2013 0435   LDLCALC 95 05/31/2013 0435   LDLDIRECT 111* 02/10/2015 1145    Additional studies/ records that were reviewed today include:   LHC (10/14):  Minor luminal irregularity in prox LAD, EF 35%  Echo (2/15):  EF 30-35%, diff HK, ant-sept AK, Gr 2 DD, mild MR, trivial TR  Nuclear (5/13):  Normal stress nuclear study. LV Ejection Fraction: 58%  Cardiac MRI  (4/15):  1. Mild LVE, EF 34%, Diff HK, paradoxical septal motion c/w LBBB, small focal area of late gadolinium enhancement at basal anteroseptum - may represent RV volume overload or a focal infiltrative disease such as sarcoidosis, but certainly wouldn't explain the degree of of LV dysfunction. - c/w NICM, normal RV size and function (RVEF 61%), Mild MR and    ASSESSMENT:    1. Chronic systolic CHF (congestive heart failure) ([place])   2. NICM (nonischemic cardiomyopathy) ([place])   3. Essential hypertension   4. ICD (implantable cardioverter-defibrillator), dual, in situ   5. 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[place])     PLAN:    In order of problems listed above:  1.Chronic systolic heart failure - She notes a recent 5 lbs weight gain.  I have asked her to increase her Lasix to 20 mg QD x 3 days, then resume her usual dose.  She is NYHA 2-b.  She would probably benefit from Corlanor to help reduce her HR to 70 or less.  For now, I would like to increase her beta-blocker first to see if she can tolerate this.  -  Increase Lasix x 3 days as noted.  -  Increase Toprol-XL to 75 mg QD x 1 week, then 100 mg QD  -  If HR remains > 70, consider Corlanor in the future.   2.NICM (nonischemic cardiomyopathy): Continue beta blocker, ARB, spironolactone. As noted, consider Corlanor if HR remains > 70.   3.HTN -  Controlled.   4.s/p ICD - Follow up with EP as planned.  5. Parkinson's - I have asked her to arrange FU with neurology.  Suspect some of her dizziness is related to her Parkinson's.  She would likely benefit from PT.     Medication Adjustments/Labs and Tests Ordered: Current medicines are reviewed at length with the patient today.  Concerns regarding medicines are outlined above.  Medication changes, Labs and Tests ordered today are outlined in the Patient Instructions noted below. Patient Instructions  Medication  Instructions:  1. INCREASE LASIX TO 20 MG DAILY FOR THE NEXT 3 DAYS;  THEN GO BACK TO YOUR USUAL DOSE   2. INCREASE TOPROL TO 75 MG FOR 1 WEEK; THEN INCREASE TO 100 MG DAILY; IF YOU ARE NOT ABLE TO TOLERATE THE INCREASE CALL THE OFFICE [phone] TO LET [employer], PAC KNOW.   Labwork: NONE  Testing/Procedures: NONE  Follow-Up: 11/27/15 @ 10 AM WITH DR. Burt Knack  Any Other Special Instructions Will Be Listed Below (If Applicable). HERE ARE THE PHONE NUMBERS TO:  [place] PHONE # IS [phone]  [place] PHONE # IS [phone]    If you need a refill on your cardiac medications before your next appointment, please call your pharmacy.       Signed, Richardson Dopp, PA-C  08/24/2015 10:52 AM    [facility] [address], [place], [place]  [address] Phone: [phone]; Fax: [phone]

## 2015-08-24 ENCOUNTER — Ambulatory Visit (INDEPENDENT_AMBULATORY_CARE_PROVIDER_SITE_OTHER): Payer: Medicare Other | Admitting: Physician Assistant

## 2015-08-24 ENCOUNTER — Encounter: Payer: Self-pay | Admitting: Physician Assistant

## 2015-08-24 VITALS — BP 110/54 | HR 86 | Ht 61.0 in | Wt 144.8 lb

## 2015-08-24 DIAGNOSIS — I5022 Chronic systolic (congestive) heart failure: Secondary | ICD-10-CM | POA: Diagnosis not present

## 2015-08-24 DIAGNOSIS — I429 Cardiomyopathy, unspecified: Secondary | ICD-10-CM | POA: Diagnosis not present

## 2015-08-24 DIAGNOSIS — I428 Other cardiomyopathies: Secondary | ICD-10-CM

## 2015-08-24 DIAGNOSIS — I10 Essential (primary) hypertension: Secondary | ICD-10-CM

## 2015-08-24 DIAGNOSIS — G20 Parkinson's disease: Secondary | ICD-10-CM

## 2015-08-24 DIAGNOSIS — Z95810 Presence of automatic (implantable) cardiac defibrillator: Secondary | ICD-10-CM | POA: Diagnosis not present

## 2015-08-24 MED ORDER — METOPROLOL SUCCINATE ER 50 MG PO TB24: 100.0000 mg | ORAL_TABLET | ORAL | Status: DC

## 2015-08-24 NOTE — Patient Instructions (Signed)
Medication Instructions:  1. INCREASE LASIX TO 20 MG DAILY FOR THE NEXT 3 DAYS; THEN GO BACK TO YOUR USUAL DOSE   2. INCREASE TOPROL TO 75 MG FOR 1 WEEK; THEN INCREASE TO 100 MG DAILY; IF YOU ARE NOT ABLE TO TOLERATE THE INCREASE CALL THE OFFICE [phone] TO LET [employer], PAC KNOW.   Labwork: NONE  Testing/Procedures: NONE  Follow-Up: 11/27/15 @ 10 AM WITH DR. Burt Knack  Any Other Special Instructions Will Be Listed Below (If Applicable). HERE ARE THE PHONE NUMBERS TO:  [place] PHONE # IS [phone]  [place] PHONE # IS [phone]    If you need a refill on your cardiac medications before your next appointment, please call your pharmacy.

## 2015-09-16 ENCOUNTER — Ambulatory Visit (INDEPENDENT_AMBULATORY_CARE_PROVIDER_SITE_OTHER): Payer: Medicare Other | Admitting: Adult Health

## 2015-09-16 ENCOUNTER — Encounter: Payer: Self-pay | Admitting: Adult Health

## 2015-09-16 ENCOUNTER — Encounter: Payer: Self-pay | Admitting: Family Medicine

## 2015-09-16 ENCOUNTER — Ambulatory Visit: Payer: Medicare Other | Admitting: Family Medicine

## 2015-09-16 ENCOUNTER — Ambulatory Visit (INDEPENDENT_AMBULATORY_CARE_PROVIDER_SITE_OTHER): Payer: Medicare Other | Admitting: Family Medicine

## 2015-09-16 VITALS — BP 122/70 | HR 97 | Temp 98.8°F | Ht 61.0 in | Wt 144.0 lb

## 2015-09-16 VITALS — BP 123/46 | HR 94 | Temp 98.2°F | Wt 144.3 lb

## 2015-09-16 DIAGNOSIS — R899 Unspecified abnormal finding in specimens from other organs, systems and tissues: Secondary | ICD-10-CM | POA: Diagnosis not present

## 2015-09-16 DIAGNOSIS — J4530 Mild persistent asthma, uncomplicated: Secondary | ICD-10-CM | POA: Diagnosis not present

## 2015-09-16 DIAGNOSIS — E118 Type 2 diabetes mellitus with unspecified complications: Secondary | ICD-10-CM

## 2015-09-16 LAB — POCT GLYCOSYLATED HEMOGLOBIN (HGB A1C): Hemoglobin A1C: 6.8

## 2015-09-16 MED ORDER — HYDROCODONE-HOMATROPINE 5-1.5 MG/5ML PO SYRP: 5.0000 mL | ORAL_SOLUTION | Freq: Four times a day (QID) | ORAL | Status: DC | PRN

## 2015-09-16 MED ORDER — AZITHROMYCIN 250 MG PO TABS: ORAL_TABLET | ORAL | Status: DC

## 2015-09-16 MED ORDER — BENZONATATE 100 MG PO CAPS: 100.0000 mg | ORAL_CAPSULE | Freq: Three times a day (TID) | ORAL | Status: DC | PRN

## 2015-09-16 NOTE — Patient Instructions (Addendum)
Zpack take as directed  Saline nasal rinses As needed   Follow med calendar closely and bring to each visit.  Please contact office for sooner follow up if symptoms do not improve or worsen or seek emergency care  follow up Dr. Melvyn Novas  In 6-8 weeks and As needed

## 2015-09-16 NOTE — Patient Instructions (Signed)
Kidney function: your lab value was about the same as it has been. There may be some early evidence of kidney disease but it would be "stage 1", which is the lowest stage.   Going forward you want to make sure that your blood pressure is well controlled (it is today in clinic) as well as your diabetes being well controlled (we will check your A1c on the way out the door).

## 2015-09-16 NOTE — Progress Notes (Signed)
   Subjective:    Patient ID: Veronica Shaw, female    DOB: 1950/11/28, 65 y.o.   MRN: [id number]  HPI  CC: follow up on labs  # Kidney function:  Comes in asking about her labs drawn at last visit, was told to follow up with PCP but she was unavailable.   Creatinine was mildly elevated (1.03) with eGFR 65 ROS: no issues with voiding  # Diabetes  Says this is doing well  Taking metformin 2 tabs twice a day; she sometimes decreases this to 1 tab twice daily  Checks her blood sugars about 3 times a week. Highest reading is 190-200, no lows below 70 ROS: no polyuria, no dysuria  Social Hx: never smoker  Review of Systems   See HPI for ROS.   Past medical history, surgical, family, and social history reviewed and updated in the EMR as appropriate. Objective:  BP 123/46 mmHg  Pulse 94  Temp(Src) 98.2 F (36.8 C) (Oral)  Wt 144 lb 4.8 oz (65.454 kg) Vitals and nursing note reviewed  General: no apparent distress  CV: normal rate, regular rhythm, no murmurs, rubs or gallop. Resp: clear to auscultation bilaterally, normal effort  Assessment & Plan:  Type II diabetes mellitus with complication I3G still well controlled at 6.8. Discussed treatment dosing of metformin being '1000mg'$  twice daily. Continue this, likely can do 3-6 month follow up for this since she has a history of being well controlled.  Abnormal lab result Discussed her creatinine being relatively the same as prior, and just barely above normal. eGFR still >60. Discussed NSAID use, importance of keeping blood pressure and diabetes under control. Follow up as needed.

## 2015-09-16 NOTE — Progress Notes (Signed)
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 Advice Only    referred by Dr. Burt Knack for asthma.    used neb alb and ventolin but > 4h  prior to OV  Last steroids > 3 weeks prior to OV  And worse since finished , esp at hs  With cough/ wheeze rec Start dulera 200 Take 2 puffs first thing in am and then another 2 puffs about 12 hours later.  Work on inhaler technique:   Only use your albuterol (ventolin) as a rescue Only use your nebulizer if you try the ventolin first and it doesn't work Continue Pantoprazole (protonix) 40 mg but be sure to  Take  30-60 min before first meal of the day and Zantac 150 mg one @  bedtime GERD diet         02/17/2015 f/u ov/[facility]ert re: asthma   Chief Complaint  Patient presents with  . Follow-up    PFT today.  Breathing doing great. only complaint today is leg cramps from feet to knees.  much better on dulera 200 2 bid and no need for saba but noct muscle cramps have developed on it rec Try dulera 200 2puffs every am and pm dose is optional  Work on inhaler technique:     06/17/2015  f/u ov/[facility] re: asthma > ok on dulera 200 one bid s need for saba  Chief Complaint  Patient presents with  . Follow-up    severe asthma. pt. states breathing has improved. occ. SOB. denies wheezing,cough or chest  pain/tightness.  Not limited by breathing from desired activities  rec Change dulera  200 1 pffs each am and asmanex 200 2 at bedtime Work on inhaler technique:       08/19/2015  Extended ov/[facility] re: asthma with uacs component dulera 200 one pff/ asthmanex 2 at hs  Chief Complaint  Patient presents with  . Follow-up    Pt states that her breathing has improved. She c/o "too much phlem" bothers her most at night.   bad cough since last ov > green mucus > mucinex / abx from home > resolved the purulent sputum but not the cough  Main problem when head hits pillow cough gets worse with sense of pnds but no excess mucus production now Doe x  R leg stops her first   Has gabapentin uses prn  Very confused with details of care >>Gabapentin Twice daily    09/16/2015 Follow up : Asthma with UACS and Med review  Pt returns for follow up .  We reviewed her meds and organized them into a med calendar with pt education  Appears to be taking correctly .   [name] Kitchen Pt c/o prod cough with green mucus, SOB, chest congestion/tightness, sinus pressure/drainage x 3 days. Denies any fever,  nausea or vomiting, chest pain, orthopnea or edema.   Current Medications, Allergies, Complete Past Medical History, Past Surgical History, Family History, and Social History were reviewed in [employer] record.  ROS  The following are not active complaints unless bolded sore throat, dysphagia, dental problems, itching, sneezing,  nasal congestion or excess/ purulent secretions, ear ache,   fever, chills, sweats, unintended wt loss, classically pleuritic or exertional cp,  orthopnea pnd or leg swelling, presyncope, palpitations, abdominal pain, anorexia, nausea, vomiting, diarrhea  or change in bowel or bladder habits, change in stools or urine, dysuria,hematuria,  rash, arthralgias, visual complaints, headache, numbness, weakness or ataxia or problems with walking or coordination,  change in mood/affect or memory.            Objective:   Physical Exam Filed Vitals:   09/16/15 0934  BP:  122/70  Pulse: 97  Temp: 98.8 F (37.1 C)  TempSrc: Oral  Height: 5\' 1"  (1.549 m)  Weight: 144 lb (65.318 kg)  SpO2: 97%      Vital signs reviewed  HEENT: nl dentition, turbinates, and orophanx. Nl external ear canals without cough reflex   NECK :  without JVD/Nodes/TM/ nl carotid upstrokes bilaterally   LUNGS: no acc muscle use, clear to A and P bilaterally without cough on insp or exp maneuvers   CV:  RRR  no s3 or murmur or increase in P2, no edema   ABD:  soft and nontender with nl excursion in the supine position. No bruits or organomegaly, bowel sounds nl  MS:  warm without deformities, calf tenderness, cyanosis or clubbing  SKIN: warm and dry without lesions    NEURO:  alert, approp, no deficits      CXR PA and Lateral:   01/05/2015 :       No active cardiopulmonary disease.      Assessment & Plan:

## 2015-09-19 NOTE — Assessment & Plan Note (Signed)
A1c still well controlled at 6.8. Discussed treatment dosing of metformin being 1000mg  twice daily. Continue this, likely can do 3-6 month follow up for this since she has a history of being well controlled.

## 2015-09-21 NOTE — Assessment & Plan Note (Signed)
Mild flare with URI  Patient's medications were reviewed today and patient education was given. Computerized medication calendar was adjusted/completed    Plan  Zpack take as directed  Saline nasal rinses As needed   Follow med calendar closely and bring to each visit.  Please contact office for sooner follow up if symptoms do not improve or worsen or seek emergency care  follow up Dr. Melvyn Novas  In 6-8 weeks and As needed

## 2015-09-23 ENCOUNTER — Telehealth: Payer: Self-pay | Admitting: Family Medicine

## 2015-09-23 NOTE — Telephone Encounter (Signed)
Pt called because she wants a referral to [facility]. She was going to one in [facility] but now that she has insurance she wants to go to one here. jw

## 2015-09-24 ENCOUNTER — Other Ambulatory Visit: Payer: Self-pay | Admitting: Family Medicine

## 2015-09-24 ENCOUNTER — Encounter: Payer: Self-pay | Admitting: Neurology

## 2015-09-24 DIAGNOSIS — G20 Parkinson's disease: Secondary | ICD-10-CM

## 2015-09-24 DIAGNOSIS — L309 Dermatitis, unspecified: Secondary | ICD-10-CM

## 2015-09-30 ENCOUNTER — Telehealth: Payer: Self-pay | Admitting: Internal Medicine

## 2015-09-30 DIAGNOSIS — M792 Neuralgia and neuritis, unspecified: Secondary | ICD-10-CM

## 2015-09-30 MED ORDER — GABAPENTIN 100 MG PO CAPS: ORAL_CAPSULE | ORAL | Status: DC

## 2015-09-30 NOTE — Telephone Encounter (Signed)
lmtcb x1 for pt. 

## 2015-09-30 NOTE — Telephone Encounter (Signed)
Spoke with pt. She will need a refill on Gabapentin. When she was here last, MW increased Gabapentin to BID. Pt will need a refill on this sent to [facility]. This has been done. Nothing further was needed.

## 2015-09-30 NOTE — Telephone Encounter (Signed)
Patient Returned call [phone]

## 2015-10-09 ENCOUNTER — Ambulatory Visit: Payer: Medicare Other | Admitting: Neurology

## 2015-10-16 ENCOUNTER — Telehealth: Payer: Self-pay | Admitting: Family Medicine

## 2015-10-16 DIAGNOSIS — L508 Other urticaria: Secondary | ICD-10-CM

## 2015-10-16 MED ORDER — LORATADINE 10 MG PO TABS: 10.0000 mg | ORAL_TABLET | Freq: Every day | ORAL | Status: DC

## 2015-10-16 NOTE — Telephone Encounter (Signed)
Needs refill on her allergy medicine which is claritin .  Please send it to [facility] on [address]

## 2015-10-19 ENCOUNTER — Encounter: Payer: Self-pay | Admitting: Neurology

## 2015-10-19 ENCOUNTER — Ambulatory Visit (INDEPENDENT_AMBULATORY_CARE_PROVIDER_SITE_OTHER): Payer: Medicare Other | Admitting: Neurology

## 2015-10-19 VITALS — BP 124/70 | HR 109 | Ht 61.0 in | Wt 147.0 lb

## 2015-10-19 DIAGNOSIS — G20 Parkinson's disease: Secondary | ICD-10-CM | POA: Diagnosis not present

## 2015-10-19 MED ORDER — PRAMIPEXOLE DIHYDROCHLORIDE ER 1.5 MG PO TB24: 1.0000 | ORAL_TABLET | Freq: Every day | ORAL | Status: DC

## 2015-10-19 MED ORDER — PRAMIPEXOLE DIHYDROCHLORIDE ER 0.375 MG PO TB24: ORAL_TABLET | ORAL | Status: DC

## 2015-10-19 NOTE — Progress Notes (Signed)
Veronica Shaw was seen today in the movement disorders clinic for neurologic consultation at the request of Beverlyn Roux, MD.  The patient has previously been following with Dr. Kyra Searles and her physician assistant, although she has not been seen at [facility] since March, 2015 and has not been seen by Dr. Hall Busing since October, 2014.  I have reviewed [facility] records.  She was first seen by [facility] in August, 2014 and was diagnosed with probable Parkinson's disease and was started on levodopa at that visit.  Her complaints at that time were pain all over, loss of balance, and right hand tremor.  When she followed up in October, 2014 she was only taking the levodopa twice a day, but UPDRS motor score had greatly improved.  She was told to increase the carbidopa/levodopa 25/100-1-1/2 tablets 3 times a day, but when she followed up in March, 2015 she did not think that that made a significant difference.  She was told to continue the medication.  As above, she has not been seen back by the neurology clinic at [facility] since that time.  She is off of carbidopa/levodopa as she doesn't think that she has parkinsons disease.  She states that she has been off of the medication for the last 2 years.  She does state that she often goes to the right side and will lose balance     Specific Symptoms:  Tremor: Yes.   per family, pt doesn't notice (but holds the right hand while we talk) Voice: less strong Sleep: trouble staying asleep  Vivid Dreams:  Yes.  , intermittenly  Acting out dreams:  No. Wet Pillows: Yes.   Postural symptoms:  Yes.    Falls?  No. Bradykinesia symptoms: shuffling gait, slow movements, difficulty getting out of a chair and difficulty regaining balance Loss of smell:  No. Loss of taste:  No. Urinary Incontinence:  Yes.   (stress and urge incontinence - wears pad) Difficulty Swallowing:  Yes.   (both saliva and water) Handwriting, micrographia: Yes.   Trouble with ADL's:  No.  Trouble  buttoning clothing: No. Depression:  No., but can be easily frustrated Memory changes:  Yes.   Hallucinations:  No.  visual distortions: Yes.   N/V:  No. Lightheaded:  Yes.    Syncope: No. Diplopia:  No. Dyskinesia:  No.  Neuroimaging has not previously been performed.    PREVIOUS MEDICATIONS: Sinemet  ALLERGIES:   Allergies  Allergen Reactions  . Aspirin Swelling and Other (See Comments)    Causes nose bleeds *ONLY THE COATED ASA*  . Penicillins Shortness Of Breath and Rash    Shortness of Breath - Throat felt like it was closing.   [name] [Conj Estrog-Medroxyprogest Ace] Shortness Of Breath    Throat swelling  . Ace Inhibitors Cough  . Simvastatin Other (See Comments) and Rash    Muscle aches    CURRENT MEDICATIONS:  Outpatient Encounter Prescriptions as of 10/19/2015  Medication Sig  . albuterol (PROVENTIL HFA;VENTOLIN HFA) 108 (90 BASE) MCG/ACT inhaler Inhale 2 puffs into the lungs every 6 (six) hours as needed for wheezing. (Patient taking differently: Inhale 2 puffs into the lungs every 4 (four) hours as needed for wheezing or shortness of breath. )  . benzonatate (TESSALON PERLES) 100 MG capsule Take 1 capsule (100 mg total) by mouth 3 (three) times daily as needed for cough. (Patient taking differently: Take 100 mg by mouth 2 (two) times daily as needed (if still coughing). )  .  chlorpheniramine (CHLOR-TRIMETON) 4 MG tablet Take 4 mg by mouth every 4 (four) hours as needed (drippy nose, drainage, and throat clearing). Reported on 09/16/2015  . dextromethorphan (DELSYM) 30 MG/5ML liquid Reported on 09/16/2015  . furosemide (LASIX) 40 MG tablet Take 20 mg by mouth daily as needed for fluid (For swelling).   . gabapentin (NEURONTIN) 100 MG capsule Take 1 tablet twice daily (Patient taking differently: Take 100 mg by mouth at bedtime. )  . loratadine (CLARITIN) 10 MG tablet Take 1 tablet (10 mg total) by mouth daily.  . meloxicam (MOBIC) 7.5 MG tablet Take 7.5 mg by mouth  daily as needed for pain.  . metoprolol succinate (TOPROL-XL) 50 MG 24 hr tablet Take 2 tablets (100 mg total) by mouth as directed. For 1 week increase to 75 mg daily; then increase to 100 mg daily (Patient taking differently: Take 100 mg by mouth every morning. )  . mometasone-formoterol (DULERA) 100-5 MCG/ACT AERO Inhale 1 puff into the lungs every morning.   . potassium chloride (K-DUR) 10 MEQ tablet Take 1 tablet (10 mEq total) by mouth daily. (Patient taking differently: Take 1 tablet by mouth daily with lasix as needed for leg swelling)  . ranitidine (ZANTAC) 150 MG tablet Take 150 mg by mouth 2 (two) times daily.  [name] Kitchen spironolactone (ALDACTONE) 25 MG tablet TAKE 1 TABLET EVERY DAY (Patient taking differently: TAKE 1 TABLET BY MOUTH EVERY MORNING)  . triamcinolone (KENALOG) 0.025 % cream APPLY TOPICALLY TWICE DAILY  . valsartan (DIOVAN) 40 MG tablet Take 1 tablet (40 mg total) by mouth daily. (Patient taking differently: Take 40 mg by mouth every morning. )  . EPINEPHrine (EPIPEN) 0.3 mg/0.3 mL SOAJ injection Inject 0.3 mg into the muscle as needed (allergic reaction). Reported on 10/19/2015  . nitroGLYCERIN (NITROSTAT) 0.4 MG SL tablet Place 1 tablet (0.4 mg total) under the tongue every 5 (five) minutes as needed for chest pain. Take one tablet and place under tongue if develop chest pain may repeat x2 (Patient not taking: Reported on 10/19/2015)  . [DISCONTINUED] HYDROcodone-homatropine (HYDROMET) 5-1.5 MG/5ML syrup Take 5 mLs by mouth every 6 (six) hours as needed. (Patient taking differently: Take 5 mLs by mouth every 8 (eight) hours as needed (severe cough). )  . [DISCONTINUED] Mometasone Furoate (ASMANEX HFA) 200 MCG/ACT AERO Inhale 2 puffs into the lungs at bedtime.   No facility-administered encounter medications on file as of 10/19/2015.    PAST MEDICAL HISTORY:   Past Medical History  Diagnosis Date  . Sleep apnea     was retested and no longer had it and so d/c CPAP  . Gastritis     . Plantar fasciitis   . Diabetes mellitus   . Hyperlipidemia   . Asthma   . Urticaria   . Parkinson disease ([place])   . CHF (congestive heart failure) ([place])   . Hypertension     PAST SURGICAL HISTORY:   Past Surgical History  Procedure Laterality Date  . Tubal ligation    . Cholecystectomy    . Bunionectomy    . Implantable cardioverter defibrillator implant  11-25-13    MDT dual chamber ICD implanted by [name] for primary prevention  . Left and right heart catheterization with coronary angiogram N/A 05/31/2013    Procedure: LEFT AND RIGHT HEART CATHETERIZATION WITH CORONARY ANGIOGRAM;  Surgeon: [name], MD;  Location[facility] CATH LAB;  Service: Cardiovascular;  Laterality: N/A;  . Implantable cardioverter defibrillator implant N/A 11/25/2013    Procedure:  IMPLANTABLE CARDIOVERTER DEFIBRILLATOR IMPLANT;  Surgeon: [name], MD;  Location[facility] CATH LAB;  Service: Cardiovascular;  Laterality: N/A;  . Tonsillectomy      SOCIAL HISTORY:   Social History   Social History  . Marital Status: Married    Spouse Name: N/A  . Number of Children: 2  . Years of Education: N/A   Occupational History  . retired     Quarry manager   Social History Main Topics  . Smoking status: Never Smoker   . Smokeless tobacco: Never Used     Comment: exposed to smoke during child hood (parents)  . Alcohol Use: No  . Drug Use: No  . Sexual Activity: Yes    Birth Control/ Protection: Surgical, Post-menopausal   Other Topics Concern  . Not on file   Social History Narrative   Lives at home with husband and the dog.    FAMILY HISTORY:   Family Status  Relation Status Death Age  . Father Deceased     MI, heart disease  . Mother Deceased     MI, stroke, DM  . Maternal Grandmother Deceased   . Maternal Grandfather Deceased   . Paternal Grandmother Deceased   . Paternal Grandfather Deceased   . Sister Alive      heart disease x3  . Son Alive     DM  . Daughter Alive     healthy     ROS:  Gets "right sided sciatic nerve pain" and takes gabapentin.  Has chronic SOB with asthma and GERD.   A complete 10 system review of systems was obtained and was unremarkable apart from what is mentioned above.  PHYSICAL EXAMINATION:    VITALS:   Filed Vitals:   10/19/15 1405  BP: 124/70  Pulse: 109  Height: 5\' 1"  (1.549 m)  Weight: 147 lb (66.679 kg)    GEN:  The patient appears stated age and is in NAD. HEENT:  Normocephalic, atraumatic.  The mucous membranes are moist. The superficial temporal arteries are without ropiness or tenderness. CV:  RRR Lungs:  CTAB Neck/HEME:  There are no carotid bruits bilaterally.  Neurological examination:  Orientation:  Montreal Cognitive Assessment  10/19/2015  Visuospatial/ Executive (0/5) 4  Naming (0/3) 3  Attention: Read list of digits (0/2) 2  Attention: Read list of letters (0/1) 1  Attention: Serial 7 subtraction starting at 100 (0/3) 0  Language: Repeat phrase (0/2) 2  Language : Fluency (0/1) 0  Abstraction (0/2) 2  Delayed Recall (0/5) 0  Orientation (0/6) 6  Total 20  Adjusted Score (based on education) 21   Cranial nerves: There is good facial symmetry. Pupils are equal round and reactive to light bilaterally. Fundoscopic exam reveals clear margins bilaterally. Extraocular muscles are intact. The visual fields are full to confrontational testing. The speech is fluent and clear. Soft palate rises symmetrically and there is no tongue deviation. Hearing is intact to conversational tone. Sensation: Sensation is intact to light and pinprick throughout (facial, trunk, extremities). Vibration is intact at the bilateral big toe. There is no extinction with double simultaneous stimulation. There is no sensory dermatomal level identified. Motor: Strength is 5/5 in the bilateral upper and lower extremities.   Shoulder shrug is equal and symmetric.  There is no pronator drift. Deep tendon reflexes: Deep tendon reflexes are 2+-3/4  at the bilateral biceps, triceps, brachioradialis, patella and achilles. Plantar responses are downgoing bilaterally.  Movement examination: Tone: There is mild increased tone in the RUE.  Tone elsewhere is normal.   Abnormal movements: There is a mild intermittent RUE resting tremor that increases with distraction. Coordination:  There is decremation with RAM's, seen mostly on the right with alternating supination and pronation of the forearm, hand opening and closing, finger taps, heel taps and toe taps. Gait and Station: The patient has mild difficulty arising out of a deep-seated chair without the use of the hands but she is able to do this. The patient's stride length is normal with fairly good arm swing.  The pull test was attempted several times but despite repeatedly giving her instructions, she didn't seem to ever understand and there was no attempt made to catch herself and she would step forward instead of backward (even demonstrated for her what she was supposed to do).    ASSESSMENT/PLAN:  1.  Idiopathic Parkinson's disease.  The patient has tremor, bradykinesia, rigidity and mild postural instability.  -We discussed the diagnosis as well as pathophysiology of the disease.  We discussed treatment options as well as prognostic indicators.  Patient education was provided.  -Greater than 50% of the 60 minute visit was spent in counseling answering questions and talking about what to expect now as well as in the future.  We talked about medication options as well as potential future surgical options.  We talked about safety in the home.  She was previously dx but didn't believe the dx and has been off medication for 2 years.    -We decided to add pramipexole and work to 1.5 mg ER daily.  Risks, benefits, side effects and alternative therapies were discussed.  The opportunity to ask questions was given and they were answered to the best of my ability.  The patient expressed understanding and  willingness to follow the outlined treatment protocols.  Discussed risk of sleep attacks, compulsive behaviors.    -Discussed Parkinson's program [facility], for PT/OT and ST but decided to hold as finances are an issue.  We talked about the importance of safe, cardiovascular exercise in Parkinson's disease.  -We discussed community resources in the area including patient support groups and community exercise programs for PD and pt education was provided to the patient.   2.  Lumbar spinal stenosis  -She thought that she didn't have PD but that this was her only problem.  This certainly could be contributing but isn't her only issue.  She take a small dose of gabapentin at bedtime (100mg ) and it helps. 3.  Follow up is anticipated in the next few months, sooner should new neurologic issues arise.

## 2015-10-19 NOTE — Patient Instructions (Signed)
1. You will take 1 tablet daily for one week of Mirapex 0.375 mg. You will then take 2 tablets  daily for one week. You will then switch to the Mirapex 1.5 mg tablets. You will take this 1 tablet once daily.

## 2015-10-20 ENCOUNTER — Telehealth: Payer: Self-pay | Admitting: Neurology

## 2015-10-20 MED ORDER — PRAMIPEXOLE DIHYDROCHLORIDE 0.125 MG PO TABS: ORAL_TABLET | ORAL | Status: DC

## 2015-10-20 MED ORDER — PRAMIPEXOLE DIHYDROCHLORIDE 0.5 MG PO TABS: 0.5000 mg | ORAL_TABLET | Freq: Three times a day (TID) | ORAL | Status: DC

## 2015-10-20 NOTE — Telephone Encounter (Signed)
Left message on machine for patient to call back. To make her aware insurance is denying coverage for once daily Mirapex. Awaiting call back to switch to 3 times a day dosage.

## 2015-10-20 NOTE — Telephone Encounter (Signed)
Patient was returning a phone call that she got today.

## 2015-10-20 NOTE — Telephone Encounter (Signed)
Patient made aware insurance is denying coverage and she will start mirapex (pramipexole) as follows:  0.125 mg - 1 tablet three times per day for a week, then 2 tablets three times per day for a week and then fill the 0.5 mg tablet and take that, 1 pill three times per day. Will sent lower dose to [facility] and higher dose to [facility] mail order. Patient expressed understanding.

## 2015-11-03 ENCOUNTER — Ambulatory Visit (INDEPENDENT_AMBULATORY_CARE_PROVIDER_SITE_OTHER): Payer: Medicare Other | Admitting: *Deleted

## 2015-11-03 DIAGNOSIS — I429 Cardiomyopathy, unspecified: Secondary | ICD-10-CM

## 2015-11-03 DIAGNOSIS — I428 Other cardiomyopathies: Secondary | ICD-10-CM

## 2015-11-03 NOTE — Progress Notes (Signed)
Remote ICD transmission.   

## 2015-11-09 ENCOUNTER — Ambulatory Visit (INDEPENDENT_AMBULATORY_CARE_PROVIDER_SITE_OTHER): Payer: Medicare Other | Admitting: Family Medicine

## 2015-11-09 ENCOUNTER — Encounter: Payer: Self-pay | Admitting: Family Medicine

## 2015-11-09 VITALS — BP 118/61 | HR 99 | Temp 98.3°F | Ht 61.0 in | Wt 139.0 lb

## 2015-11-09 DIAGNOSIS — J309 Allergic rhinitis, unspecified: Secondary | ICD-10-CM | POA: Diagnosis not present

## 2015-11-09 DIAGNOSIS — K219 Gastro-esophageal reflux disease without esophagitis: Secondary | ICD-10-CM | POA: Diagnosis present

## 2015-11-09 MED ORDER — LEVOCETIRIZINE DIHYDROCHLORIDE 5 MG PO TABS
5.0000 mg | ORAL_TABLET | Freq: Every evening | ORAL | Status: DC
Start: 2015-11-09 — End: 2016-02-15

## 2015-11-09 MED ORDER — OMEPRAZOLE 40 MG PO CPDR: 40.0000 mg | DELAYED_RELEASE_CAPSULE | Freq: Every day | ORAL | Status: DC

## 2015-11-09 NOTE — Assessment & Plan Note (Signed)
Persistent epigastric pain, cough and burning after eating fatty meals and when lying down, doesn't think ranitidine is working as well as what she was on previously from gastroenterology - switch from ranitidine to omeprazole - f/u with GI for ongoing abdominal pain, diarrhea

## 2015-11-09 NOTE — Assessment & Plan Note (Signed)
Complains of dyspnea, frequent albuterol use and copious clear rhinorrhea, told by pulm that her lungs are fine and this is not in her lungs, reports she would like to switch pulmonologists because current doc is rude/disrespectful - will add xyzal for likely allergic component - encouraged pt to call and ask for a different pulmonologist if she is not happy with the care she is receiving

## 2015-11-09 NOTE — Progress Notes (Signed)
   Subjective:   ALBIRTHA STROLLO is a 65 y.o. female with a history of CHF, asthma here for sinus drainage and GI upset.  GI issues: Pt reports that for many months she has been having occasional loose stools, epigastric pain, reflux and nausea. She is taking ranitidine current but doesn't think it helps much. She was seen by GI 4 years ago and put on a PPI she thinks that worked much better.   Dyspnea/drainage: Pt reports using her albuterol frequently recently because she has a lot of clear drainage from her sinuses and feels slightly short of breath. She saw her pulmonologist saw her recently and assured her this was not coming from her lungs and he thought it was her GERD. She previously took claritin for allergies but has not recently. She does also report itching eyes and sneezing with her clear sinus discharge that is worse at night.  Review of Systems:  Per HPI. All other systems reviewed and are negative.   PMH, PSH, Medications, Allergies, and FmHx reviewed and updated in EMR.  Social History: never smoker  Objective:  BP 118/61 mmHg  Pulse 99  Temp(Src) 98.3 F (36.8 C) (Oral)  Ht 5\' 1"  (1.549 m)  Wt 139 lb (63.05 kg)  BMI 26.28 kg/m2  Gen:  65 y.o. female in NAD HEENT: NCAT, MMM,  anicteric sclerae, postnasal drip CV: RRR Resp: Non-labored, CTAB, no wheezes noted Abd: Soft, NTND, BS present, no guarding or organomegaly Ext: WWP, no edema Neuro: Alert and oriented, speech normal      Chemistry      Component Value Date/Time   NA 140 08/17/2015 1007   K 4.3 08/17/2015 1007   CL 103 08/17/2015 1007   CO2 28 08/17/2015 1007   BUN 16 08/17/2015 1007   CREATININE 1.03* 08/17/2015 1007   CREATININE 0.97 01/01/2015 1145      Component Value Date/Time   CALCIUM 10.0 08/17/2015 1007   ALKPHOS 101 08/17/2015 1007   AST 20 08/17/2015 1007   ALT 14 08/17/2015 1007   BILITOT 0.4 08/17/2015 1007      Lab Results  Component Value Date   WBC 6.8 08/17/2015   HGB  13.0 08/17/2015   HCT 38.6 08/17/2015   MCV 87.9 08/17/2015   PLT 218 08/17/2015   Lab Results  Component Value Date   TSH 1.50 04/23/2015   Lab Results  Component Value Date   HGBA1C 6.8 09/16/2015   Assessment & Plan:     YESHIA LEAVER is a 65 y.o. female here for sinus drainage and reflux  GERD (gastroesophageal reflux disease) Persistent epigastric pain, cough and burning after eating fatty meals and when lying down, doesn't think ranitidine is working as well as what she was on previously from gastroenterology - switch from ranitidine to omeprazole - f/u with GI for ongoing abdominal pain, diarrhea   Allergic rhinitis Complains of dyspnea, frequent albuterol use and copious clear rhinorrhea, told by pulm that her lungs are fine and this is not in her lungs, reports she would like to switch pulmonologists because current doc is rude/disrespectful - will add xyzal for likely allergic component - encouraged pt to call and ask for a different pulmonologist if she is not happy with the care she is receiving      Beverlyn Roux, MD, MPH [place] PGY-3 11/09/2015 11:01 AM

## 2015-11-09 NOTE — Patient Instructions (Signed)
For your stomach problems I am switching your ranitidine to omeprazole and recommend you see your gastroenterologist to discuss these issues and possibly get another colonoscopy.  For your shortness of breath and drainage I am starting an allergy medicine called levocetirizine.

## 2015-11-12 ENCOUNTER — Ambulatory Visit: Payer: Medicare Other | Admitting: Internal Medicine

## 2015-11-20 DIAGNOSIS — R197 Diarrhea, unspecified: Secondary | ICD-10-CM | POA: Diagnosis not present

## 2015-11-20 DIAGNOSIS — Z950 Presence of cardiac pacemaker: Secondary | ICD-10-CM | POA: Diagnosis not present

## 2015-11-20 DIAGNOSIS — R1110 Vomiting, unspecified: Secondary | ICD-10-CM | POA: Diagnosis not present

## 2015-11-20 DIAGNOSIS — K591 Functional diarrhea: Secondary | ICD-10-CM | POA: Diagnosis not present

## 2015-11-20 DIAGNOSIS — R1013 Epigastric pain: Secondary | ICD-10-CM | POA: Diagnosis not present

## 2015-11-27 ENCOUNTER — Telehealth: Payer: Self-pay | Admitting: Cardiovascular Disease

## 2015-11-27 ENCOUNTER — Encounter: Payer: Self-pay | Admitting: Cardiovascular Disease

## 2015-11-27 ENCOUNTER — Ambulatory Visit (INDEPENDENT_AMBULATORY_CARE_PROVIDER_SITE_OTHER): Payer: Medicare Other | Admitting: Cardiovascular Disease

## 2015-11-27 VITALS — BP 114/66 | HR 86 | Ht 61.0 in | Wt 142.8 lb

## 2015-11-27 DIAGNOSIS — I5022 Chronic systolic (congestive) heart failure: Secondary | ICD-10-CM

## 2015-11-27 NOTE — Progress Notes (Signed)
Cardiology Office Note Date:  11/27/2015   ID:  Veronica Shaw, Veronica Shaw 1951/06/15, MRN [id number]  PCP:  Beverlyn Roux, MD  Cardiologist:  Sherren Mocha, MD    Chief Complaint  Patient presents with  . nonischemic cardiomyopathy    Complains of cp and sob that wakes her up at night. denies lee or claudication     History of Present Illness: Veronica Shaw is a 65 y.o. female who presents for follow-up of chronic systolic heart failure. 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 She was hospitalized in October 2014 with chest pain or shortness of breath. At that time she was diagnosed with a cardiomyopathy with LVEF estimated at 35% as well as a left bundle branch block pattern on EKG. Cardiac catheterization demonstrated patent coronary arteries, normal intracardiac hemodynamics, and severe LV dysfunction. The patient and persistent severe LV dysfunction despite optimal medical therapy. She underwent ICD implantation in April 2015 by Dr. Lovena Le. She has had intermittent left bundle branch block.  She is here with her husband today. Complains of bloating and upper epigastric fullness and discomfort. She is scheduled to have upper endoscopy and colonoscopy May 8th. She complains of orthopnea, cough, and shortness of breath with activity. The symptoms are long-standing. She has not had recent chest pain. Other complaints include nausea and diarrhea. She denies leg swelling. No episodes of syncope.   Past Medical History  Diagnosis Date  . Sleep apnea     was retested and no longer had it and so d/c CPAP  . Gastritis   . Plantar fasciitis   . Diabetes mellitus   . Hyperlipidemia   . Asthma   . Urticaria   . Parkinson disease ([place])   . CHF (congestive heart failure) ([place])   . Hypertension     Past Surgical History  Procedure Laterality Date  . Tubal ligation    . Cholecystectomy    . Bunionectomy    . Implantable cardioverter  defibrillator implant  11-25-13    MDT dual chamber ICD implanted by [name] for primary prevention  . Left and right heart catheterization with coronary angiogram N/A 05/31/2013    Procedure: LEFT AND RIGHT HEART CATHETERIZATION WITH CORONARY ANGIOGRAM;  Surgeon: [name], MD;  Location: [facility] CATH LAB;  Service: Cardiovascular;  Laterality: N/A;  . Implantable cardioverter defibrillator implant N/A 11/25/2013    Procedure: IMPLANTABLE CARDIOVERTER DEFIBRILLATOR IMPLANT;  Surgeon: [name], MD;  Location: [facility] CATH LAB;  Service: Cardiovascular;  Laterality: N/A;  . Tonsillectomy      Current Outpatient Prescriptions  Medication Sig Dispense Refill  . albuterol (PROVENTIL HFA;VENTOLIN HFA) 108 (90 Base) MCG/ACT inhaler Inhale 2 puffs into the lungs every 4 (four) hours as needed for wheezing or shortness of breath.    . chlorpheniramine (CHLOR-TRIMETON) 4 MG tablet Take 4 mg by mouth every 4 (four) hours as needed (drippy nose, drainage, and throat clearing). Reported on 09/16/2015    . EPINEPHrine (EPIPEN) 0.3 mg/0.3 mL SOAJ injection Inject 0.3 mg into the muscle as needed (allergic reaction). Reported on 10/19/2015    . furosemide (LASIX) 40 MG tablet Take 20 mg by mouth daily as needed for fluid (For swelling).     . gabapentin (NEURONTIN) 100 MG capsule Take 100 mg by mouth 3 times/day as needed-between meals & bedtime (pain).     [name] Kitchen levocetirizine (XYZAL) 5 MG tablet Take 1 tablet (5 mg total) by mouth every evening.  30 tablet 11  . metoprolol (LOPRESSOR) 50 MG tablet Take 75 mg by mouth every morning.    . mometasone-formoterol (DULERA) 100-5 MCG/ACT AERO Inhale 1 puff into the lungs every morning.     . nitroGLYCERIN (NITROSTAT) 0.4 MG SL tablet Place 1 tablet (0.4 mg total) under the tongue every 5 (five) minutes as needed for chest pain. Take one tablet and place under tongue if develop chest pain may repeat x2 25 tablet 3  . omeprazole (PRILOSEC) 40 MG capsule Take 1 capsule (40  mg total) by mouth daily. 30 capsule 11  . potassium chloride (K-DUR,KLOR-CON) 10 MEQ tablet Take 10 mEq by mouth daily as needed (Take with lasix as needed for swelling).    [name] Kitchen spironolactone (ALDACTONE) 25 MG tablet Take 25 mg by mouth every morning.    . triamcinolone (KENALOG) 0.025 % cream APPLY TOPICALLY TWICE DAILY 30 g 5  . valsartan (DIOVAN) 40 MG tablet Take 40 mg by mouth every morning.     No current facility-administered medications for this visit.    Allergies:   Aspirin; Penicillins; Prempro; Ace inhibitors; and Simvastatin   Social History:  The patient  reports that she has never smoked. She has never used smokeless tobacco. She reports that she does not drink alcohol or use illicit drugs.   Family History:  The patient's  family history includes Coronary artery disease in her father and mother; Diabetes in her father and mother; Heart attack in her father and mother; Stroke in her mother.    ROS:  Please see the history of present illness.  Otherwise, review of systems is positive for balance problems, headaches, dizziness, diarrhea, blood in stool, cough.  All other systems are reviewed and negative.    PHYSICAL EXAM: VS:  BP 114/66 mmHg  Pulse 86  Ht 5\' 1"  (1.549 m)  Wt 142 lb 12.8 oz (64.774 kg)  BMI 27.00 kg/m2 , BMI Body mass index is 27 kg/(m^2). GEN: Well nourished, well developed, in no acute distress HEENT: normal Neck: no JVD, no masses. No carotid bruits Cardiac: RRR without murmur or gallop                Respiratory:  clear to auscultation bilaterally, normal work of breathing GI: soft, nontender, nondistended, + BS MS: no deformity or atrophy Ext: no pretibial edema, pedal pulses 2+= bilaterally Skin: warm and dry, no rash Neuro:  Strength and sensation are intact Psych: euthymic mood, full affect  EKG:  EKG is ordered today. The ekg ordered today shows normal sinus rhythm 89 bpm, left bundle branch block.  Recent Labs: 12/09/2014: Pro B  Natriuretic peptide (BNP) 52.0 04/23/2015: TSH 1.50 08/17/2015: ALT 14; BUN 16; Creat 1.03*; Hemoglobin 13.0; Platelets 218; Potassium 4.3; Sodium 140   Lipid Panel     Component Value Date/Time   CHOL 172 05/31/2013 0435   TRIG 142 05/31/2013 0435   HDL 49 05/31/2013 0435   CHOLHDL 3.5 05/31/2013 0435   VLDL 28 05/31/2013 0435   LDLCALC 95 05/31/2013 0435   LDLDIRECT 111* 02/10/2015 1145      Wt Readings from Last 3 Encounters:  11/27/15 142 lb 12.8 oz (64.774 kg)  11/09/15 139 lb (63.05 kg)  10/19/15 147 lb (66.679 kg)     Cardiac Studies Reviewed: 2-D echocardiogram 09/20/2013: Study Conclusions  - Left ventricle: The cavity size was normal. Systolic function was moderately to severely reduced. The estimated ejection fraction was in the range of 30% to 35%. Diffuse hypokinesis.  There is severe hypokinesis to akinesis of the entire anteroseptal myocardium. Features are consistent with a pseudonormal left ventricular filling pattern, with concomitant abnormal relaxation and increased filling pressure (grade 2 diastolic dysfunction). - Mitral valve: Mild regurgitation. - Tricuspid valve: Trivial regurgitation.  ASSESSMENT AND PLAN: Chronic systolic heart failure, NYHA IIb. History is somewhat difficult and complicated by long-standing symptoms. Somewhat unclear if her symptoms are cardiac or GI related. I think it is appropriate that she is going through a GI evaluation as planned with upper and lower endoscopy. I also think that we should update her echocardiogram. I have recommended a right heart catheterization to assess her hemodynamics. She's has no history of coronary artery disease and had normal coronary arteries when she underwent left heart catheterization. Hopefully her medications can be adjusted based on findings of her echocardiogram and right heart catheterization. I have reviewed the risks, indications and alternatives to right heart  catheterization with the patient. She understands and agrees to proceed. We'll plan on doing this after her endoscopic studies are completed. We'll continue on a combination of metoprolol succinate, spironolactone, and valsartan.  Current medicines are reviewed with the patient today.  The patient does not have concerns regarding medicines.  Labs/ tests ordered today include:  No orders of the defined types were placed in this encounter.    Disposition:   FU 3 moths with Richardson Dopp, PA-C.   Deatra James, MD  11/27/2015 10:38 AM    [facility] [address], [place], [place]  [address] Phone: [phone]; Fax: [phone]

## 2015-11-27 NOTE — Patient Instructions (Addendum)
Medication Instructions:  Your physician recommends that you continue on your current medications as directed. Please refer to the Current Medication list given to you today.  Please review your medications at home and contact the office in regards to your metoprolol and dosage.  We are trying to determine if you are taking Metoprolol Tartrate or Metoprolol Succinate.   Labwork: Your physician recommends that you return for lab work on 12/09/15 (lab hours 7:30-4:30): BMP, CBC and PT/INR  Testing/Procedures: Your physician has requested that you have an echocardiogram. Echocardiography is a painless test that uses sound waves to create images of your heart. It provides your doctor with information about the size and shape of your heart and how well your heart's chambers and valves are working. This procedure takes approximately one hour. There are no restrictions for this procedure.  Your physician has requested that you have a cardiac catheterization. Cardiac catheterization is used to diagnose and/or treat various heart conditions. Doctors may recommend this procedure for a number of different reasons. The most common reason is to evaluate chest pain. Chest pain can be a symptom of coronary artery disease (CAD), and cardiac catheterization can show whether plaque is narrowing or blocking your heart's arteries. This procedure is also used to evaluate the valves, as well as measure the blood flow and oxygen levels in different parts of your heart. For further information please visit [email or web address]. Please follow instruction sheet, as given.  Follow-Up: Your physician recommends that you schedule a follow-up appointment in: 3 MONTHS with Richardson Dopp PA-C  Your physician wants you to follow-up in: 6 MONTHS with Dr Burt Knack.  You will receive a reminder letter in the mail two months in advance. If you don't receive a letter, please call our office to schedule the follow-up appointment.   Any Other  Special Instructions Will Be Listed Below (If Applicable).     If you need a refill on your cardiac medications before your next appointment, please call your pharmacy.

## 2015-11-27 NOTE — Telephone Encounter (Signed)
New Message:  Pt called in wanting to report that she is taking Metoprolol ER Succinate 50mg . Please f/u with her for further details .

## 2015-11-27 NOTE — Telephone Encounter (Signed)
I spoke with the pt and she is taking Metoprolol Succinate 50mg  daily.  I made Dr Burt Knack aware of this information and he would like the pt to continue this dosage. Pt aware.

## 2015-11-29 ENCOUNTER — Other Ambulatory Visit: Payer: Self-pay | Admitting: Cardiovascular Disease

## 2015-11-29 DIAGNOSIS — I5022 Chronic systolic (congestive) heart failure: Secondary | ICD-10-CM

## 2015-12-09 ENCOUNTER — Other Ambulatory Visit (HOSPITAL_COMMUNITY): Payer: Medicare Other

## 2015-12-09 ENCOUNTER — Other Ambulatory Visit: Payer: Self-pay | Admitting: Gastroenterology

## 2015-12-09 ENCOUNTER — Other Ambulatory Visit: Payer: Medicare Other

## 2015-12-09 DIAGNOSIS — K3189 Other diseases of stomach and duodenum: Secondary | ICD-10-CM | POA: Diagnosis not present

## 2015-12-09 DIAGNOSIS — R1110 Vomiting, unspecified: Secondary | ICD-10-CM | POA: Diagnosis not present

## 2015-12-09 DIAGNOSIS — R1013 Epigastric pain: Secondary | ICD-10-CM | POA: Diagnosis not present

## 2015-12-09 DIAGNOSIS — R197 Diarrhea, unspecified: Secondary | ICD-10-CM | POA: Diagnosis not present

## 2015-12-09 DIAGNOSIS — K640 First degree hemorrhoids: Secondary | ICD-10-CM | POA: Diagnosis not present

## 2015-12-09 DIAGNOSIS — K5730 Diverticulosis of large intestine without perforation or abscess without bleeding: Secondary | ICD-10-CM | POA: Diagnosis not present

## 2015-12-11 ENCOUNTER — Other Ambulatory Visit (INDEPENDENT_AMBULATORY_CARE_PROVIDER_SITE_OTHER): Payer: Medicare Other

## 2015-12-11 ENCOUNTER — Ambulatory Visit (HOSPITAL_COMMUNITY): Payer: Medicare Other | Attending: Cardiovascular Disease

## 2015-12-11 ENCOUNTER — Other Ambulatory Visit: Payer: Self-pay

## 2015-12-11 DIAGNOSIS — I340 Nonrheumatic mitral (valve) insufficiency: Secondary | ICD-10-CM | POA: Insufficient documentation

## 2015-12-11 DIAGNOSIS — I5022 Chronic systolic (congestive) heart failure: Secondary | ICD-10-CM | POA: Diagnosis not present

## 2015-12-11 DIAGNOSIS — I429 Cardiomyopathy, unspecified: Secondary | ICD-10-CM | POA: Diagnosis not present

## 2015-12-11 DIAGNOSIS — E785 Hyperlipidemia, unspecified: Secondary | ICD-10-CM | POA: Diagnosis not present

## 2015-12-11 DIAGNOSIS — I110 Hypertensive heart disease with heart failure: Secondary | ICD-10-CM | POA: Diagnosis not present

## 2015-12-11 DIAGNOSIS — I509 Heart failure, unspecified: Secondary | ICD-10-CM | POA: Diagnosis present

## 2015-12-11 DIAGNOSIS — E119 Type 2 diabetes mellitus without complications: Secondary | ICD-10-CM | POA: Insufficient documentation

## 2015-12-11 LAB — CBC
HCT: 36.7 % (ref 35.0–45.0)
Hemoglobin: 12.5 g/dL (ref 11.7–15.5)
MCH: 29.3 pg (ref 27.0–33.0)
MCHC: 34.1 g/dL (ref 32.0–36.0)
MCV: 85.9 fL (ref 80.0–100.0)
MPV: 12.6 fL — ABNORMAL HIGH (ref 7.5–12.5)
Platelets: 197 10*3/uL (ref 140–400)
RBC: 4.27 MIL/uL (ref 3.80–5.10)
RDW: 13.2 % (ref 11.0–15.0)
WBC: 7.2 10*3/uL (ref 3.8–10.8)

## 2015-12-11 LAB — PROTIME-INR
INR: 0.97 (ref <1.50)
Prothrombin Time: 13 seconds (ref 11.6–15.2)

## 2015-12-12 LAB — BASIC METABOLIC PANEL
BUN: 17 mg/dL (ref 7–25)
CO2: 26 mmol/L (ref 20–31)
Calcium: 9.9 mg/dL (ref 8.6–10.4)
Chloride: 106 mmol/L (ref 98–110)
Creat: 1.26 mg/dL — ABNORMAL HIGH (ref 0.50–0.99)
Glucose, Bld: 86 mg/dL (ref 65–99)
Potassium: 4.1 mmol/L (ref 3.5–5.3)
Sodium: 140 mmol/L (ref 135–146)

## 2015-12-15 ENCOUNTER — Other Ambulatory Visit: Payer: Self-pay | Admitting: *Deleted

## 2015-12-15 DIAGNOSIS — K644 Residual hemorrhoidal skin tags: Secondary | ICD-10-CM

## 2015-12-15 MED ORDER — HYDROCORTISONE 1 % EX OINT: 1.0000 "application " | TOPICAL_OINTMENT | Freq: Two times a day (BID) | CUTANEOUS | Status: DC

## 2015-12-16 ENCOUNTER — Encounter (HOSPITAL_COMMUNITY)
Admission: RE | Disposition: A | Discharge status: Home / Self Care | Payer: Self-pay | Source: Ambulatory Visit | Attending: Cardiovascular Disease

## 2015-12-16 ENCOUNTER — Encounter: Payer: Self-pay | Admitting: Cardiology

## 2015-12-16 ENCOUNTER — Encounter (HOSPITAL_COMMUNITY): Payer: Self-pay | Admitting: Cardiovascular Disease

## 2015-12-16 ENCOUNTER — Ambulatory Visit (HOSPITAL_COMMUNITY)
Admission: RE | Admit: 2015-12-16 | Discharge: 2015-12-16 | Disposition: A | Discharge status: Home / Self Care | Payer: Medicare Other | Source: Ambulatory Visit | Attending: Cardiovascular Disease | Admitting: Cardiovascular Disease

## 2015-12-16 DIAGNOSIS — G4730 Sleep apnea, unspecified: Secondary | ICD-10-CM | POA: Insufficient documentation

## 2015-12-16 DIAGNOSIS — I110 Hypertensive heart disease with heart failure: Secondary | ICD-10-CM | POA: Insufficient documentation

## 2015-12-16 DIAGNOSIS — Z8249 Family history of ischemic heart disease and other diseases of the circulatory system: Secondary | ICD-10-CM | POA: Insufficient documentation

## 2015-12-16 DIAGNOSIS — G20 Parkinson's disease: Secondary | ICD-10-CM | POA: Insufficient documentation

## 2015-12-16 DIAGNOSIS — I447 Left bundle-branch block, unspecified: Secondary | ICD-10-CM | POA: Diagnosis not present

## 2015-12-16 DIAGNOSIS — I429 Cardiomyopathy, unspecified: Secondary | ICD-10-CM | POA: Diagnosis not present

## 2015-12-16 DIAGNOSIS — E119 Type 2 diabetes mellitus without complications: Secondary | ICD-10-CM | POA: Diagnosis not present

## 2015-12-16 DIAGNOSIS — Z880 Allergy status to penicillin: Secondary | ICD-10-CM | POA: Insufficient documentation

## 2015-12-16 DIAGNOSIS — I5030 Unspecified diastolic (congestive) heart failure: Secondary | ICD-10-CM | POA: Diagnosis present

## 2015-12-16 DIAGNOSIS — J45909 Unspecified asthma, uncomplicated: Secondary | ICD-10-CM | POA: Insufficient documentation

## 2015-12-16 DIAGNOSIS — E785 Hyperlipidemia, unspecified: Secondary | ICD-10-CM | POA: Diagnosis not present

## 2015-12-16 DIAGNOSIS — I5022 Chronic systolic (congestive) heart failure: Secondary | ICD-10-CM | POA: Diagnosis not present

## 2015-12-16 HISTORY — PX: CARDIAC CATHETERIZATION: SHX172

## 2015-12-16 LAB — CUP PACEART REMOTE DEVICE CHECK
Battery Remaining Longevity: 117 mo
Battery Voltage: 3.01 V
Brady Statistic AP VP Percent: 0 %
Brady Statistic AP VS Percent: 0.22 %
Brady Statistic AS VP Percent: 0.04 %
Brady Statistic AS VS Percent: 99.74 %
Brady Statistic RA Percent Paced: 0.23 %
Brady Statistic RV Percent Paced: 0.04 %
Date Time Interrogation Session: 20170404083823
HighPow Impedance: 70 Ohm
Implantable Lead Implant Date: 20150427
Implantable Lead Implant Date: 20150427
Implantable Lead Location: 753859
Implantable Lead Location: 753860
Implantable Lead Model: 5076
Implantable Lead Model: 6935
Lead Channel Impedance Value: 399 Ohm
Lead Channel Impedance Value: 4047 Ohm
Lead Channel Impedance Value: 4047 Ohm
Lead Channel Impedance Value: 4047 Ohm
Lead Channel Impedance Value: 589 Ohm
Lead Channel Impedance Value: 665 Ohm
Lead Channel Pacing Threshold Amplitude: 0.5 V
Lead Channel Pacing Threshold Amplitude: 0.5 V
Lead Channel Pacing Threshold Pulse Width: 0.4 ms
Lead Channel Pacing Threshold Pulse Width: 0.4 ms
Lead Channel Sensing Intrinsic Amplitude: 1.875 mV
Lead Channel Sensing Intrinsic Amplitude: 1.875 mV
Lead Channel Sensing Intrinsic Amplitude: 18.625 mV
Lead Channel Sensing Intrinsic Amplitude: 18.625 mV
Lead Channel Setting Pacing Amplitude: 2 V
Lead Channel Setting Pacing Amplitude: 2.5 V
Lead Channel Setting Pacing Pulse Width: 0.4 ms
Lead Channel Setting Sensing Sensitivity: 0.3 mV

## 2015-12-16 LAB — POCT I-STAT 3, VENOUS BLOOD GAS (G3P V)
Bicarbonate: 25.4 mEq/L — ABNORMAL HIGH (ref 20.0–24.0)
Bicarbonate: 25.6 mEq/L — ABNORMAL HIGH (ref 20.0–24.0)
O2 Saturation: 63 %
O2 Saturation: 64 %
TCO2: 27 mmol/L (ref 0–100)
TCO2: 27 mmol/L (ref 0–100)
pCO2, Ven: 41.5 mmHg — ABNORMAL LOW (ref 45.0–50.0)
pCO2, Ven: 42.7 mmHg — ABNORMAL LOW (ref 45.0–50.0)
pH, Ven: 7.385 — ABNORMAL HIGH (ref 7.250–7.300)
pH, Ven: 7.394 — ABNORMAL HIGH (ref 7.250–7.300)
pO2, Ven: 33 mmHg (ref 31.0–45.0)
pO2, Ven: 34 mmHg (ref 31.0–45.0)

## 2015-12-16 LAB — GLUCOSE, CAPILLARY: Glucose-Capillary: 165 mg/dL — ABNORMAL HIGH (ref 65–99)

## 2015-12-16 SURGERY — RIGHT HEART CATH
Anesthesia: LOCAL

## 2015-12-16 MED ORDER — SODIUM CHLORIDE 0.9% FLUSH: 3.0000 mL | Freq: Two times a day (BID) | INTRAVENOUS | Status: DC

## 2015-12-16 MED ORDER — LIDOCAINE HCL (PF) 1 % IJ SOLN
INTRAMUSCULAR | Status: DC | PRN
  Administered 2015-12-16: 2 mL

## 2015-12-16 MED ORDER — SODIUM CHLORIDE 0.9 % IV SOLN: 250.0000 mL | INTRAVENOUS | Status: DC | PRN

## 2015-12-16 MED ORDER — SODIUM CHLORIDE 0.9% FLUSH: 3.0000 mL | INTRAVENOUS | Status: DC | PRN

## 2015-12-16 MED ORDER — FENTANYL CITRATE (PF) 100 MCG/2ML IJ SOLN
INTRAMUSCULAR | Status: AC
  Filled 2015-12-16: qty 2

## 2015-12-16 MED ORDER — LIDOCAINE HCL (PF) 1 % IJ SOLN
INTRAMUSCULAR | Status: AC
  Filled 2015-12-16: qty 30

## 2015-12-16 MED ORDER — MIDAZOLAM HCL 2 MG/2ML IJ SOLN
INTRAMUSCULAR | Status: AC
  Filled 2015-12-16: qty 2

## 2015-12-16 MED ORDER — MIDAZOLAM HCL 2 MG/2ML IJ SOLN
INTRAMUSCULAR | Status: DC | PRN
  Administered 2015-12-16: 1 mg via INTRAVENOUS

## 2015-12-16 MED ORDER — NITROGLYCERIN 1 MG/10 ML FOR IR/CATH LAB
INTRA_ARTERIAL | Status: AC
  Filled 2015-12-16: qty 10

## 2015-12-16 MED ORDER — HEPARIN (PORCINE) IN NACL 2-0.9 UNIT/ML-% IJ SOLN
INTRAMUSCULAR | Status: AC
  Filled 2015-12-16: qty 1000

## 2015-12-16 MED ORDER — HEPARIN (PORCINE) IN NACL 2-0.9 UNIT/ML-% IJ SOLN
INTRAMUSCULAR | Status: DC | PRN
  Administered 2015-12-16: 500 mL

## 2015-12-16 MED ORDER — FENTANYL CITRATE (PF) 100 MCG/2ML IJ SOLN
INTRAMUSCULAR | Status: DC | PRN
  Administered 2015-12-16: 25 ug via INTRAVENOUS

## 2015-12-16 MED ORDER — SODIUM CHLORIDE 0.9 % IV SOLN
INTRAVENOUS | Status: DC
Start: 2015-12-17 — End: 2015-12-16
  Administered 2015-12-16: 07:00:00 via INTRAVENOUS

## 2015-12-16 MED ORDER — SODIUM CHLORIDE 0.9 % IV SOLN: INTRAVENOUS | Status: DC

## 2015-12-16 MED ORDER — ACETAMINOPHEN 325 MG PO TABS: 650.0000 mg | ORAL_TABLET | ORAL | Status: DC | PRN

## 2015-12-16 MED ORDER — ONDANSETRON HCL 4 MG/2ML IJ SOLN: 4.0000 mg | Freq: Four times a day (QID) | INTRAMUSCULAR | Status: DC | PRN

## 2015-12-16 SURGICAL SUPPLY — 5 items
CATH BALLN WEDGE 5F 110CM (CATHETERS) ×1 IMPLANT
PACK CARDIAC CATHETERIZATION (CUSTOM PROCEDURE TRAY) ×1 IMPLANT
PROTECTION STATION PRESSURIZED (MISCELLANEOUS) ×2
SHEATH FAST CATH BRACH 5F 5CM (SHEATH) ×1 IMPLANT
STATION PROTECTION PRESSURIZED (MISCELLANEOUS) IMPLANT

## 2015-12-16 NOTE — Interval H&P Note (Signed)
History and Physical Interval Note:  12/16/2015 7:19 AM  Veronica Shaw  has presented today for surgery, with the diagnosis of chronic systolic heart failure  The various methods of treatment have been discussed with the patient and family. After consideration of risks, benefits and other options for treatment, the patient has consented to  Procedure(s): Right Heart Cath (N/A) as a surgical intervention .  The patient's history has been reviewed, patient examined, no change in status, stable for surgery.  I have reviewed the patient's chart and labs.  Questions were answered to the patient's satisfaction.     Sherren Mocha

## 2015-12-16 NOTE — Discharge Instructions (Signed)
Angiogram, Care After °Refer to this sheet in the next few weeks. These instructions provide you with information about caring for yourself after your procedure. Your health care provider may also give you more specific instructions. Your treatment has been planned according to current medical practices, but problems sometimes occur. Call your health care provider if you have any problems or questions after your procedure. °WHAT TO EXPECT AFTER THE PROCEDURE °After your procedure, it is typical to have the following: °· Bruising at the catheter insertion site that usually fades within 1-2 weeks. °· Blood collecting in the tissue (hematoma) that may be painful to the touch. It should usually decrease in size and tenderness within 1-2 weeks. °HOME CARE INSTRUCTIONS °· Take medicines only as directed by your health care provider. °· You may shower 24-48 hours after the procedure or as directed by your health care provider. Remove the bandage (dressing) and gently wash the site with plain soap and water. Pat the area dry with a clean towel. Do not rub the site, because this may cause bleeding. °· Do not take baths, swim, or use a hot tub until your health care provider approves. °· Check your insertion site every day for redness, swelling, or drainage. °· Do not apply powder or lotion to the site. °· Do not lift over 10 lb (4.5 kg) for 5 days after your procedure or as directed by your health care provider. °· Ask your health care provider when it is okay to: °¨ Return to work or school. °¨ Resume usual physical activities or sports. °¨ Resume sexual activity. °· Do not drive home if you are discharged the same day as the procedure. Have someone else drive you. °· You may drive 24 hours after the procedure unless otherwise instructed by your health care provider. °· Do not operate machinery or power tools for 24 hours after the procedure or as directed by your health care provider. °· If your procedure was done as an  outpatient procedure, which means that you went home the same day as your procedure, a responsible adult should be with you for the first 24 hours after you arrive home. °· Keep all follow-up visits as directed by your health care provider. This is important. °SEEK MEDICAL CARE IF: °· You have a fever. °· You have chills. °· You have increased bleeding from the catheter insertion site. Hold pressure on the site. °SEEK IMMEDIATE MEDICAL CARE IF: °· You have unusual pain at the catheter insertion site. °· You have redness, warmth, or swelling at the catheter insertion site. °· You have drainage (other than a small amount of blood on the dressing) from the catheter insertion site. °· The catheter insertion site is bleeding, and the bleeding does not stop after 30 minutes of holding steady pressure on the site. °· The area near or just beyond the catheter insertion site becomes pale, cool, tingly, or numb. °  °This information is not intended to replace advice given to you by your health care provider. Make sure you discuss any questions you have with your health care provider. °  °Document Released: 02/03/2005 Document Revised: 08/08/2014 Document Reviewed: 12/19/2012 °Elsevier Interactive Patient Education ©2016 [employer] ° °

## 2015-12-16 NOTE — H&P (View-Only) (Signed)
Cardiology Office Note Date:  11/27/2015   ID:  Veronica, Shaw 1951/06/15, MRN [id number]  PCP:  Beverlyn Roux, MD  Cardiologist:  Sherren Mocha, MD    Chief Complaint  Patient presents with  . nonischemic cardiomyopathy    Complains of cp and sob that wakes her up at night. denies lee or claudication     History of Present Illness: Veronica Shaw is a 65 y.o. female who presents for follow-up of chronic systolic heart failure. 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 She was hospitalized in October 2014 with chest pain or shortness of breath. At that time she was diagnosed with a cardiomyopathy with LVEF estimated at 35% as well as a left bundle branch block pattern on EKG. Cardiac catheterization demonstrated patent coronary arteries, normal intracardiac hemodynamics, and severe LV dysfunction. The patient and persistent severe LV dysfunction despite optimal medical therapy. She underwent ICD implantation in April 2015 by Dr. Lovena Le. She has had intermittent left bundle branch block.  She is here with her husband today. Complains of bloating and upper epigastric fullness and discomfort. She is scheduled to have upper endoscopy and colonoscopy May 8th. She complains of orthopnea, cough, and shortness of breath with activity. The symptoms are long-standing. She has not had recent chest pain. Other complaints include nausea and diarrhea. She denies leg swelling. No episodes of syncope.   Past Medical History  Diagnosis Date  . Sleep apnea     was retested and no longer had it and so d/c CPAP  . Gastritis   . Plantar fasciitis   . Diabetes mellitus   . Hyperlipidemia   . Asthma   . Urticaria   . Parkinson disease ([place])   . CHF (congestive heart failure) ([place])   . Hypertension     Past Surgical History  Procedure Laterality Date  . Tubal ligation    . Cholecystectomy    . Bunionectomy    . Implantable cardioverter  defibrillator implant  11-25-13    MDT dual chamber ICD implanted by [name] for primary prevention  . Left and right heart catheterization with coronary angiogram N/A 05/31/2013    Procedure: LEFT AND RIGHT HEART CATHETERIZATION WITH CORONARY ANGIOGRAM;  Surgeon: [name], MD;  Location: [facility] CATH LAB;  Service: Cardiovascular;  Laterality: N/A;  . Implantable cardioverter defibrillator implant N/A 11/25/2013    Procedure: IMPLANTABLE CARDIOVERTER DEFIBRILLATOR IMPLANT;  Surgeon: [name], MD;  Location: [facility] CATH LAB;  Service: Cardiovascular;  Laterality: N/A;  . Tonsillectomy      Current Outpatient Prescriptions  Medication Sig Dispense Refill  . albuterol (PROVENTIL HFA;VENTOLIN HFA) 108 (90 Base) MCG/ACT inhaler Inhale 2 puffs into the lungs every 4 (four) hours as needed for wheezing or shortness of breath.    . chlorpheniramine (CHLOR-TRIMETON) 4 MG tablet Take 4 mg by mouth every 4 (four) hours as needed (drippy nose, drainage, and throat clearing). Reported on 09/16/2015    . EPINEPHrine (EPIPEN) 0.3 mg/0.3 mL SOAJ injection Inject 0.3 mg into the muscle as needed (allergic reaction). Reported on 10/19/2015    . furosemide (LASIX) 40 MG tablet Take 20 mg by mouth daily as needed for fluid (For swelling).     . gabapentin (NEURONTIN) 100 MG capsule Take 100 mg by mouth 3 times/day as needed-between meals & bedtime (pain).     [name] Kitchen levocetirizine (XYZAL) 5 MG tablet Take 1 tablet (5 mg total) by mouth every evening.  30 tablet 11  . metoprolol (LOPRESSOR) 50 MG tablet Take 75 mg by mouth every morning.    . mometasone-formoterol (DULERA) 100-5 MCG/ACT AERO Inhale 1 puff into the lungs every morning.     . nitroGLYCERIN (NITROSTAT) 0.4 MG SL tablet Place 1 tablet (0.4 mg total) under the tongue every 5 (five) minutes as needed for chest pain. Take one tablet and place under tongue if develop chest pain may repeat x2 25 tablet 3  . omeprazole (PRILOSEC) 40 MG capsule Take 1 capsule (40  mg total) by mouth daily. 30 capsule 11  . potassium chloride (K-DUR,KLOR-CON) 10 MEQ tablet Take 10 mEq by mouth daily as needed (Take with lasix as needed for swelling).    [name] Kitchen spironolactone (ALDACTONE) 25 MG tablet Take 25 mg by mouth every morning.    . triamcinolone (KENALOG) 0.025 % cream APPLY TOPICALLY TWICE DAILY 30 g 5  . valsartan (DIOVAN) 40 MG tablet Take 40 mg by mouth every morning.     No current facility-administered medications for this visit.    Allergies:   Aspirin; Penicillins; Prempro; Ace inhibitors; and Simvastatin   Social History:  The patient  reports that she has never smoked. She has never used smokeless tobacco. She reports that she does not drink alcohol or use illicit drugs.   Family History:  The patient's  family history includes Coronary artery disease in her father and mother; Diabetes in her father and mother; Heart attack in her father and mother; Stroke in her mother.    ROS:  Please see the history of present illness.  Otherwise, review of systems is positive for balance problems, headaches, dizziness, diarrhea, blood in stool, cough.  All other systems are reviewed and negative.    PHYSICAL EXAM: VS:  BP 114/66 mmHg  Pulse 86  Ht 5\' 1"  (1.549 m)  Wt 142 lb 12.8 oz (64.774 kg)  BMI 27.00 kg/m2 , BMI Body mass index is 27 kg/(m^2). GEN: Well nourished, well developed, in no acute distress HEENT: normal Neck: no JVD, no masses. No carotid bruits Cardiac: RRR without murmur or gallop                Respiratory:  clear to auscultation bilaterally, normal work of breathing GI: soft, nontender, nondistended, + BS MS: no deformity or atrophy Ext: no pretibial edema, pedal pulses 2+= bilaterally Skin: warm and dry, no rash Neuro:  Strength and sensation are intact Psych: euthymic mood, full affect  EKG:  EKG is ordered today. The ekg ordered today shows normal sinus rhythm 89 bpm, left bundle branch block.  Recent Labs: 12/09/2014: Pro B  Natriuretic peptide (BNP) 52.0 04/23/2015: TSH 1.50 08/17/2015: ALT 14; BUN 16; Creat 1.03*; Hemoglobin 13.0; Platelets 218; Potassium 4.3; Sodium 140   Lipid Panel     Component Value Date/Time   CHOL 172 05/31/2013 0435   TRIG 142 05/31/2013 0435   HDL 49 05/31/2013 0435   CHOLHDL 3.5 05/31/2013 0435   VLDL 28 05/31/2013 0435   LDLCALC 95 05/31/2013 0435   LDLDIRECT 111* 02/10/2015 1145      Wt Readings from Last 3 Encounters:  11/27/15 142 lb 12.8 oz (64.774 kg)  11/09/15 139 lb (63.05 kg)  10/19/15 147 lb (66.679 kg)     Cardiac Studies Reviewed: 2-D echocardiogram 09/20/2013: Study Conclusions  - Left ventricle: The cavity size was normal. Systolic function was moderately to severely reduced. The estimated ejection fraction was in the range of 30% to 35%. Diffuse hypokinesis.  There is severe hypokinesis to akinesis of the entire anteroseptal myocardium. Features are consistent with a pseudonormal left ventricular filling pattern, with concomitant abnormal relaxation and increased filling pressure (grade 2 diastolic dysfunction). - Mitral valve: Mild regurgitation. - Tricuspid valve: Trivial regurgitation.  ASSESSMENT AND PLAN: Chronic systolic heart failure, NYHA IIb. History is somewhat difficult and complicated by long-standing symptoms. Somewhat unclear if her symptoms are cardiac or GI related. I think it is appropriate that she is going through a GI evaluation as planned with upper and lower endoscopy. I also think that we should update her echocardiogram. I have recommended a right heart catheterization to assess her hemodynamics. She's has no history of coronary artery disease and had normal coronary arteries when she underwent left heart catheterization. Hopefully her medications can be adjusted based on findings of her echocardiogram and right heart catheterization. I have reviewed the risks, indications and alternatives to right heart  catheterization with the patient. She understands and agrees to proceed. We'll plan on doing this after her endoscopic studies are completed. We'll continue on a combination of metoprolol succinate, spironolactone, and valsartan.  Current medicines are reviewed with the patient today.  The patient does not have concerns regarding medicines.  Labs/ tests ordered today include:  No orders of the defined types were placed in this encounter.    Disposition:   FU 3 moths with Richardson Dopp, PA-C.   Deatra James, MD  11/27/2015 10:38 AM    [facility] [address], [place], [place]  [address] Phone: [phone]; Fax: [phone]

## 2015-12-17 ENCOUNTER — Telehealth: Payer: Self-pay | Admitting: Cardiovascular Disease

## 2015-12-17 MED FILL — Heparin Sodium (Porcine) 2 Unit/ML in Sodium Chloride 0.9%: INTRAMUSCULAR | Qty: 500 | Status: AC

## 2015-12-17 NOTE — Telephone Encounter (Signed)
Spoke with Veronica Shaw and informed her that we were just waiting for Dr. Burt Knack to send those results over to [place]. Veronica Shaw states she had a cath yesterday but couldn't remember if he reviewed it with her or not. Advised that I will route this message to Dr. Antionette Char nurse to follow up once echo resulted.  Veronica Shaw verbalized understanding and was appreciative for call back.

## 2015-12-17 NOTE — Telephone Encounter (Signed)
New message ° ° ° ° ° °Calling to get echo results °

## 2015-12-21 NOTE — Telephone Encounter (Signed)
I spoke with the pt and made her aware of echocardiogram results.

## 2015-12-21 NOTE — Telephone Encounter (Signed)
See echo result note 

## 2015-12-22 ENCOUNTER — Ambulatory Visit (INDEPENDENT_AMBULATORY_CARE_PROVIDER_SITE_OTHER): Payer: Medicare Other | Admitting: Neurology

## 2015-12-22 ENCOUNTER — Encounter: Payer: Self-pay | Admitting: Neurology

## 2015-12-22 VITALS — BP 102/60 | HR 78 | Ht 61.0 in | Wt 144.0 lb

## 2015-12-22 DIAGNOSIS — G20 Parkinson's disease: Secondary | ICD-10-CM | POA: Diagnosis not present

## 2015-12-22 NOTE — Progress Notes (Signed)
Veronica Shaw was seen today in the movement disorders clinic for neurologic consultation at the request of Beverlyn Roux, MD.  The patient has previously been following with Dr. Kyra Searles and her physician assistant, although she has not been seen at [facility] since March, 2015 and has not been seen by Dr. Hall Busing since October, 2014.  I have reviewed [facility] records.  She was first seen by [facility] in August, 2014 and was diagnosed with probable Parkinson's disease and was started on levodopa at that visit.  Her complaints at that time were pain all over, loss of balance, and right hand tremor.  When she followed up in October, 2014 she was only taking the levodopa twice a day, but UPDRS motor score had greatly improved.  She was told to increase the carbidopa/levodopa 25/100-1-1/2 tablets 3 times a day, but when she followed up in March, 2015 she did not think that that made a significant difference.  She was told to continue the medication.  As above, she has not been seen back by the neurology clinic at [facility] since that time.  She is off of carbidopa/levodopa as she doesn't think that she has parkinsons disease.  She states that she has been off of the medication for the last 2 years.  She does state that she often goes to the right side and will lose balance    12/22/15 update:  The patient follows up today regarding Parkinson's disease.  She was started on pramipexole 0.5 mg and supposed to work up to tid.  She admits that she didn't take it.  Her sister told her not to.  She states that she "told me before that she tried it" but I reminded her that she tried levodopa but only took it bid and then she d/c it.  She was worried about dyskinesia.  She states that she went to a prayer meeting and thinks that she may be cured.  She admits that she occasionally uses a cane because she goes to the right.    She denies any compulsive behaviors.  Denies sleep attacks.  No falls.  No hallucinations.  No  lightheadedness or near syncope.  I did review records since last visit.  She underwent a right heart cath on 12/16/2015.  There was low filling pressures, but normal cardiac output.  Dr. Burt Knack suspected her shortness of breath secondary to asthma.  She states that has limited her exercise activities.    PREVIOUS MEDICATIONS: Sinemet (pt was only taking bid and didn't think helped)  ALLERGIES:   Allergies  Allergen Reactions  . Aspirin Swelling and Other (See Comments)    Causes nose bleeds *ONLY THE COATED ASA*  . Penicillins Shortness Of Breath and Rash    Shortness of Breath - Throat felt like it was closing.   [name] [Conj Estrog-Medroxyprogest Ace] Shortness Of Breath    Throat swelling  . Ace Inhibitors Cough  . Simvastatin Other (See Comments) and Rash    Muscle aches    CURRENT MEDICATIONS:  Outpatient Encounter Prescriptions as of 12/22/2015  Medication Sig  . albuterol (PROVENTIL HFA;VENTOLIN HFA) 108 (90 Base) MCG/ACT inhaler Inhale 2 puffs into the lungs every 4 (four) hours as needed for wheezing or shortness of breath.  . furosemide (LASIX) 40 MG tablet Take 20 mg by mouth daily as needed for fluid (For swelling).   . gabapentin (NEURONTIN) 100 MG capsule Take 100 mg by mouth 3 times/day as needed-between meals & bedtime (pain).   [name] Kitchen  hydrocortisone 1 % ointment Apply 1 application topically 2 (two) times daily.  [name] Kitchen levocetirizine (XYZAL) 5 MG tablet Take 1 tablet (5 mg total) by mouth every evening. (Patient taking differently: Take 5 mg by mouth daily as needed for allergies. )  . metoprolol succinate (TOPROL-XL) 50 MG 24 hr tablet Take 50 mg by mouth daily. Take with or immediately following a meal.  . mometasone-formoterol (DULERA) 100-5 MCG/ACT AERO Inhale 1 puff into the lungs every morning.   [name] Kitchen omeprazole (PRILOSEC) 40 MG capsule Take 1 capsule (40 mg total) by mouth daily.  . pantoprazole (PROTONIX) 40 MG tablet Take 40 mg by mouth daily.  . potassium chloride  (K-DUR,KLOR-CON) 10 MEQ tablet Take 10 mEq by mouth daily as needed (Take with lasix as needed for swelling).  [name] Kitchen spironolactone (ALDACTONE) 25 MG tablet Take 25 mg by mouth every morning.  . sucralfate (CARAFATE) 1 g tablet   . triamcinolone (KENALOG) 0.025 % cream APPLY TOPICALLY TWICE DAILY  . valsartan (DIOVAN) 40 MG tablet Take 40 mg by mouth every morning.  . [DISCONTINUED] chlorpheniramine (CHLOR-TRIMETON) 4 MG tablet Take 4 mg by mouth every 4 (four) hours as needed (drippy nose, drainage, and throat clearing). Reported on 09/16/2015  . EPINEPHrine (EPIPEN) 0.3 mg/0.3 mL SOAJ injection Inject 0.3 mg into the muscle as needed (allergic reaction). Reported on 12/22/2015  . nitroGLYCERIN (NITROSTAT) 0.4 MG SL tablet Place 1 tablet (0.4 mg total) under the tongue every 5 (five) minutes as needed for chest pain. Take one tablet and place under tongue if develop chest pain may repeat x2 (Patient not taking: Reported on 12/22/2015)   No facility-administered encounter medications on file as of 12/22/2015.    PAST MEDICAL HISTORY:   Past Medical History  Diagnosis Date  . Sleep apnea     was retested and no longer had it and so d/c CPAP  . Gastritis   . Plantar fasciitis   . Diabetes mellitus   . Hyperlipidemia   . Asthma   . Urticaria   . Parkinson disease ([place])   . CHF (congestive heart failure) ([place])   . Hypertension     PAST SURGICAL HISTORY:   Past Surgical History  Procedure Laterality Date  . Tubal ligation    . Cholecystectomy    . Bunionectomy    . Implantable cardioverter defibrillator implant  11-25-13    MDT dual chamber ICD implanted by [name] for primary prevention  . Left and right heart catheterization with coronary angiogram N/A 05/31/2013    Procedure: LEFT AND RIGHT HEART CATHETERIZATION WITH CORONARY ANGIOGRAM;  Surgeon: [name], MD;  Location[facility] CATH LAB;  Service: Cardiovascular;  Laterality: N/A;  . Implantable cardioverter defibrillator implant N/A  11/25/2013    Procedure: IMPLANTABLE CARDIOVERTER DEFIBRILLATOR IMPLANT;  Surgeon: [name], MD;  Location: [facility] CATH LAB;  Service: Cardiovascular;  Laterality: N/A;  . Tonsillectomy    . Cardiac catheterization N/A 12/16/2015    Procedure: Right Heart Cath;  Surgeon: Sherren Mocha, MD;  Location: [place] CV LAB;  Service: Cardiovascular;  Laterality: N/A;    SOCIAL HISTORY:   Social History   Social History  . Marital Status: Married    Spouse Name: N/A  . Number of Children: 2  . Years of Education: N/A   Occupational History  . retired     Quarry manager   Social History Main Topics  . Smoking status: Never Smoker   . Smokeless tobacco: Never Used  Comment: exposed to smoke during child hood (parents)  . Alcohol Use: No  . Drug Use: No  . Sexual Activity: Yes    Birth Control/ Protection: Surgical, Post-menopausal   Other Topics Concern  . Not on file   Social History Narrative   Lives at home with husband and the dog.    FAMILY HISTORY:   Family Status  Relation Status Death Age  . Father Deceased     MI, heart disease  . Mother Deceased     MI, stroke, DM  . Maternal Grandmother Deceased   . Maternal Grandfather Deceased   . Paternal Grandmother Deceased   . Paternal Grandfather Deceased   . Sister Alive      heart disease x3  . Son Alive     DM  . Daughter Alive     healthy    ROS:  Gets "right sided sciatic nerve pain" and takes gabapentin.  Has chronic SOB with asthma and GERD.   A complete 10 system review of systems was obtained and was unremarkable apart from what is mentioned above.  PHYSICAL EXAMINATION:    VITALS:   Filed Vitals:   12/22/15 1301  BP: 102/60  Pulse: 78  Height: 5\' 1"  (1.549 m)  Weight: 144 lb (65.318 kg)    GEN:  The patient appears stated age and is in NAD. HEENT:  Normocephalic, atraumatic.  The mucous membranes are moist. The superficial temporal arteries are without ropiness or tenderness. CV:  RRR Lungs:   CTAB Neck/HEME:  There are no carotid bruits bilaterally.  Neurological examination:  Orientation:  Montreal Cognitive Assessment  10/19/2015  Visuospatial/ Executive (0/5) 4  Naming (0/3) 3  Attention: Read list of digits (0/2) 2  Attention: Read list of letters (0/1) 1  Attention: Serial 7 subtraction starting at 100 (0/3) 0  Language: Repeat phrase (0/2) 2  Language : Fluency (0/1) 0  Abstraction (0/2) 2  Delayed Recall (0/5) 0  Orientation (0/6) 6  Total 20  Adjusted Score (based on education) 21   Cranial nerves: There is good facial symmetry. Pupils are equal round and reactive to light bilaterally. Fundoscopic exam reveals clear margins bilaterally. Extraocular muscles are intact. The visual fields are full to confrontational testing. The speech is fluent and clear. Soft palate rises symmetrically and there is no tongue deviation. Hearing is intact to conversational tone. Sensation: Sensation is intact to light and pinprick throughout (facial, trunk, extremities). Vibration is intact at the bilateral big toe. There is no extinction with double simultaneous stimulation. There is no sensory dermatomal level identified. Motor: Strength is 5/5 in the bilateral upper and lower extremities.   Shoulder shrug is equal and symmetric.  There is no pronator drift.   Movement examination: Tone: There is mild increased tone in the RUE.  Tone elsewhere is normal.   Abnormal movements: There is a rare, mild intermittent RUE resting tremor that is only present with distraction Coordination:  There is decremation with RAM's, seen with finger taps bilaterally and hand opening and closing bilaterally. Gait and Station: The patient has mild difficulty arising out of a deep-seated chair without the use of the hands but she is able to do this. The patient's stride length is normal with fairly good arm swing.    ASSESSMENT/PLAN:  1.  Idiopathic Parkinson's disease.  The patient has tremor,  bradykinesia, rigidity and mild postural instability.  Dx was made in 03/2013 at [facility].    -given mirapex but refused to take.  Understands consequences of this decision.    -has been on levodopa in past when at [facility] but only took bid and didn't think helpful and d/c.  Is off now.  -talked about importance of exercising.   2.  Lumbar spinal stenosis  -She thought that she didn't have PD but that this was her only problem.  This certainly could be contributing but isn't her only issue.  She take a small dose of gabapentin at bedtime (100mg  prn) and it helps. 3.  Will see her in 6 months.  Much greater than 50% of this visit was spent in counseling and coordinating care.  Total face to face time:  25 min

## 2016-01-01 ENCOUNTER — Encounter: Payer: Self-pay | Admitting: Physician Assistant

## 2016-01-04 DIAGNOSIS — R197 Diarrhea, unspecified: Secondary | ICD-10-CM | POA: Diagnosis not present

## 2016-01-04 DIAGNOSIS — R159 Full incontinence of feces: Secondary | ICD-10-CM | POA: Diagnosis not present

## 2016-02-03 ENCOUNTER — Ambulatory Visit (INDEPENDENT_AMBULATORY_CARE_PROVIDER_SITE_OTHER): Payer: Medicare Other | Admitting: *Deleted

## 2016-02-03 DIAGNOSIS — Z95810 Presence of automatic (implantable) cardiac defibrillator: Secondary | ICD-10-CM

## 2016-02-03 DIAGNOSIS — I429 Cardiomyopathy, unspecified: Secondary | ICD-10-CM | POA: Diagnosis not present

## 2016-02-03 DIAGNOSIS — I428 Other cardiomyopathies: Secondary | ICD-10-CM

## 2016-02-03 NOTE — Progress Notes (Signed)
Remote ICD transmission.   

## 2016-02-05 LAB — CUP PACEART REMOTE DEVICE CHECK
Battery Remaining Longevity: 114 mo
Battery Voltage: 3.01 V
Brady Statistic AP VP Percent: 0 %
Brady Statistic AP VS Percent: 0.52 %
Brady Statistic AS VP Percent: 0.03 %
Brady Statistic AS VS Percent: 99.45 %
Brady Statistic RA Percent Paced: 0.52 %
Brady Statistic RV Percent Paced: 0.03 %
Date Time Interrogation Session: 20170705084225
HighPow Impedance: 80 Ohm
Implantable Lead Implant Date: 20150427
Implantable Lead Implant Date: 20150427
Implantable Lead Location: 753859
Implantable Lead Location: 753860
Implantable Lead Model: 5076
Implantable Lead Model: 6935
Lead Channel Impedance Value: 399 Ohm
Lead Channel Impedance Value: 4047 Ohm
Lead Channel Impedance Value: 4047 Ohm
Lead Channel Impedance Value: 4047 Ohm
Lead Channel Impedance Value: 665 Ohm
Lead Channel Impedance Value: 722 Ohm
Lead Channel Pacing Threshold Amplitude: 0.5 V
Lead Channel Pacing Threshold Amplitude: 0.625 V
Lead Channel Pacing Threshold Pulse Width: 0.4 ms
Lead Channel Pacing Threshold Pulse Width: 0.4 ms
Lead Channel Sensing Intrinsic Amplitude: 1.75 mV
Lead Channel Sensing Intrinsic Amplitude: 1.75 mV
Lead Channel Sensing Intrinsic Amplitude: 14.625 mV
Lead Channel Sensing Intrinsic Amplitude: 14.625 mV
Lead Channel Setting Pacing Amplitude: 2 V
Lead Channel Setting Pacing Amplitude: 2.5 V
Lead Channel Setting Pacing Pulse Width: 0.4 ms
Lead Channel Setting Sensing Sensitivity: 0.3 mV

## 2016-02-08 ENCOUNTER — Other Ambulatory Visit: Payer: Self-pay | Admitting: *Deleted

## 2016-02-08 MED ORDER — METFORMIN HCL 500 MG PO TABS: ORAL_TABLET | ORAL | Status: DC

## 2016-02-10 ENCOUNTER — Encounter: Payer: Self-pay | Admitting: Cardiology

## 2016-02-12 ENCOUNTER — Ambulatory Visit (INDEPENDENT_AMBULATORY_CARE_PROVIDER_SITE_OTHER): Payer: Medicare Other | Admitting: Family Medicine

## 2016-02-12 ENCOUNTER — Encounter: Payer: Self-pay | Admitting: Family Medicine

## 2016-02-12 VITALS — BP 118/40 | Ht 61.0 in | Wt 143.0 lb

## 2016-02-12 DIAGNOSIS — G20 Parkinson's disease: Secondary | ICD-10-CM

## 2016-02-12 NOTE — Assessment & Plan Note (Signed)
Long discussion. She recently saw the neurologist and we discussed that. Her biggest concern is that she does not want to end up like her older sister who evidently has significant parkinsonian movement issues. Unclear whether these are from the Parkinson's itself or from side effects of treatment. Hurst her to discuss things further with her neurologist, consider at least a brief trial the medication to see if it helps her. In lieu of that she at least needs to walk with a cane. Greater than 50% of our 30  minute office visit was spent in counseling and education regarding these issues.

## 2016-02-12 NOTE — Progress Notes (Signed)
   Subjective:    Patient ID: Veronica Shaw, female    DOB: 01-17-1951, 65 y.o.   MRN: [id number]  HPI Gait imbalance  Has noticed over the last 2-3 years increasing problems with falls and weakness on the left side. Has been told she had Parkinson's and was given some medication but said she never take it for more than a couple of days. The last 6-8 months she's had increasing problems with clumsiness in her feet, unsteadiness while standing or walking. She's tried using a cane a couple times but doesn't really like to do that. She wonders if she's had a stroke or what is wrong with her. She notes that she had hand tremor couple of years ago but is noticed that is no longer present.  PERTINENT  PMH / PSH: I have reviewed the patient's medications, allergies, past medical and surgical history. Pertinent findings that relate to today's visit / issues include: Diagnosis of Parkinson's 2013 Diabetes mellitus type 2 Nonischemic cardiomyopathy History of depression Cardiovascular disease with implantable defibrillator Hypertension, hyperlipidemia, gastroesophageal reflux disease   Review of Systems  Musculoskeletal: Positive for arthralgias and gait problem.  Neurological: Positive for dizziness and weakness. Negative for tremors, facial asymmetry and headaches.       Reports unsteadiness, weakness on the left, causing falls occasionally. Note some clumsiness of her hands and feet at times. Denies hand her voice tremor.   denies any blurred vision, no visual or auditory hallucinations     Objective:   Physical Exam Vital signs are reviewed GEN.: Well-developed female no acute distress MSK: Upper extremity on the left side strength 4 out of 5 or 5 and 6, 5 out of 5 on the right. Hip flexors, knee flexion extension, dorsiflexion plantarflexion are all asymmetrical with the left side strength  4 out of 5 right 5 out of 5, Grip strength 4/ 5 left, 5 out of 5 right. Gait: Diminished stride length.  Slight unsteadiness with a little bit of a wide base to her stance and gait. 3. Turn. Rises from a chair with some difficulty but without any assistance. She also gets on and off exam table in similar fashion. TONE: Slight cogwheel rigidity noted on the left upper extremity. PSYCHIATRIC: Alert and oriented 4. Affect is interactive. Speech is normal and fluency in content. Recent remote memory is intact. Mildly tearful at times. Asks and answers questions appropriate early. NEURO: No tremor is noticed. Cranial nerves II-12 symmetrical and intact.  2-3+ DTRs bilateral knee. Biceps 2+ on the right, 1+ left.        Assessment & Plan:

## 2016-02-12 NOTE — Progress Notes (Signed)
Cardiology Office Note:    Date:  02/15/2016   ID:  Veronica Shaw, DOB 05-21-1951, MRN [id number]  PCP:  Darci Needle, MD  Cardiologist:  Dr. Sherren Mocha   Electrophysiologist:  Dr. Cristopher Peru   Referring MD: Frazier Richards, MD   Chief Complaint  Patient presents with  . Congestive Heart Failure    Follow-up    History of Present Illness:    Veronica Shaw is a 65 y.o. female with a hx of nonischemic cardiomyopathy, systolic CHF, s/p AICD 0000000, diabetes, HL, sleep apnea, asthma.  Last seen by Dr. Burt Knack 11/27/15. She complained of abdominal bloating. She was already set up for GI studies. From a cardiac perspective, echocardiogram was repeated and she was up for right heart catheterization to assess her hemodynamics. Echocardiogram in 5/17 demonstrated EF 0000000, mild diastolic dysfunction and mild MR. RHC in 5/17 demonstrated preserved cardiac output and low intracardiac filling pressures. She was felt to be well compensated with her congestive heart failure medical therapy was continued.  She returns for FU. She is here alone today.  She is doing well.  She remains short of breath with mod activity.  She denies any worsening. She denies PND.  She sleeps on an incline chronically.  She denies chest pain.  She denies syncope.   Past Medical History  Diagnosis Date  . Sleep apnea     was retested and no longer had it and so d/c CPAP  . Gastritis   . Plantar fasciitis   . Diabetes mellitus   . Hyperlipidemia   . Asthma   . Urticaria   . Parkinson disease ([place])   . Chronic systolic CHF (congestive heart failure) ([place])     a. cMRI 4/15: EF 34% and findings - c/w NICM, normal RV size and function (RVEF 61%), Mild MR // b. Echo 2/15:  EF 30-35%, diff HK, ant-sept AK, Gr 2 DD, mild MR, trivial TR  //  c. Echo 5/17: EF 20-25%, severe diffuse HK, marked systolic dyssynchrony, grade 1 diastolic dysfunction, mild MR  //  d. RHC 5/17: Fick CO 2.9, RVSP 19, PASP 15, PW mean 2,  low filing pressures and preserved CO   . NICM (nonischemic cardiomyopathy) ([place])     a. Nuclear 5/13: Normal stress nuclear study. LV Ejection Fraction: 58%  //  b. LHC 10/14: Minor luminal irregularity in prox LAD, EF 35%   . HTN (hypertension)     Past Surgical History  Procedure Laterality Date  . Tubal ligation    . Cholecystectomy    . Bunionectomy    . Implantable cardioverter defibrillator implant  11-25-13    MDT dual chamber ICD implanted by [name] for primary prevention  . Left and right heart catheterization with coronary angiogram N/A 05/31/2013    Procedure: LEFT AND RIGHT HEART CATHETERIZATION WITH CORONARY ANGIOGRAM;  Surgeon: [name], MD;  Location: [facility] CATH LAB;  Service: Cardiovascular;  Laterality: N/A;  . Implantable cardioverter defibrillator implant N/A 11/25/2013    Procedure: IMPLANTABLE CARDIOVERTER DEFIBRILLATOR IMPLANT;  Surgeon: [name], MD;  Location: [facility] CATH LAB;  Service: Cardiovascular;  Laterality: N/A;  . Tonsillectomy    . Cardiac catheterization N/A 12/16/2015    Procedure: Right Heart Cath;  Surgeon: Sherren Mocha, MD;  Location: [facility] CV LAB;  Service: Cardiovascular;  Laterality: N/A;    Current Medications: Outpatient Prescriptions Prior to Visit  Medication Sig Dispense Refill  . albuterol (PROVENTIL HFA;VENTOLIN HFA) 108 (90  Base) MCG/ACT inhaler Inhale 2 puffs into the lungs every 4 (four) hours as needed for wheezing or shortness of breath.    [name] Kitchen albuterol (PROVENTIL) (2.5 MG/3ML) 0.083% nebulizer solution 2.5 mg.    . cholestyramine (QUESTRAN) 4 GM/DOSE powder     . EPINEPHrine (EPIPEN) 0.3 mg/0.3 mL SOAJ injection Inject 0.3 mg into the muscle as needed (allergic reaction). Reported on 12/22/2015    . furosemide (LASIX) 40 MG tablet Take 20 mg by mouth daily as needed for fluid (For swelling).     . gabapentin (NEURONTIN) 100 MG capsule Take 100 mg by mouth 3 times/day as needed-between meals & bedtime (pain).     .  hydrocortisone 1 % ointment Apply 1 application topically 2 (two) times daily. 56 g 3  . metFORMIN (GLUCOPHAGE) 500 MG tablet TAKE TWO TABLETS BY MOUTH TWICE DAILY WITH A MEAL 360 tablet 3  . metoprolol succinate (TOPROL-XL) 50 MG 24 hr tablet Take 50 mg by mouth daily. Take with or immediately following a meal.    . mometasone-formoterol (DULERA) 100-5 MCG/ACT AERO Inhale 1 puff into the lungs every morning.     . nitroGLYCERIN (NITROSTAT) 0.4 MG SL tablet Place 1 tablet (0.4 mg total) under the tongue every 5 (five) minutes as needed for chest pain. Take one tablet and place under tongue if develop chest pain may repeat x2 25 tablet 3  . omeprazole (PRILOSEC) 40 MG capsule Take 1 capsule (40 mg total) by mouth daily. 30 capsule 11  . pantoprazole (PROTONIX) 40 MG tablet Take 40 mg by mouth daily.    . potassium chloride (K-DUR,KLOR-CON) 10 MEQ tablet Take 10 mEq by mouth daily as needed (Take with lasix as needed for swelling).    [name] Kitchen spironolactone (ALDACTONE) 25 MG tablet Take 25 mg by mouth every morning.    . valsartan (DIOVAN) 40 MG tablet Take 40 mg by mouth every morning.    [name] Kitchen levocetirizine (XYZAL) 5 MG tablet Take 1 tablet (5 mg total) by mouth every evening. (Patient not taking: Reported on 02/15/2016) 30 tablet 11  . sucralfate (CARAFATE) 1 g tablet Reported on 02/15/2016    . triamcinolone (KENALOG) 0.025 % cream APPLY TOPICALLY TWICE DAILY (Patient not taking: Reported on 02/15/2016) 30 g 5   No facility-administered medications prior to visit.      Allergies:   Aspirin; Penicillins; Prempro; Ace inhibitors; and Simvastatin   Social History   Social History  . Marital Status: Married    Spouse Name: N/A  . Number of Children: 2  . Years of Education: N/A   Occupational History  . retired     Quarry manager   Social History Main Topics  . Smoking status: Never Smoker   . Smokeless tobacco: Never Used     Comment: exposed to smoke during child hood (parents)  . Alcohol Use: No  .  Drug Use: No  . Sexual Activity: Yes    Birth Control/ Protection: Surgical, Post-menopausal   Other Topics Concern  . None   Social History Narrative   Lives at home with husband and the dog.     Family History:  The patient's family history includes Coronary artery disease in her father and mother; Diabetes in her father and mother; Heart attack in her father and mother; Stroke in her mother.   ROS:   Please see the history of present illness.    Review of Systems  Cardiovascular: Positive for dyspnea on exertion.  Respiratory: Positive for snoring.  Skin: Positive for rash.  Musculoskeletal: Positive for back pain and myalgias.   All other systems reviewed and are negative.   Physical Exam:    VS:  BP 108/60 mmHg  Pulse 76  Ht 5\' 1"  (1.549 m)  Wt 143 lb 12.8 oz (65.227 kg)  BMI 27.18 kg/m2    Wt Readings from Last 3 Encounters:  02/15/16 143 lb 12.8 oz (65.227 kg)  02/12/16 143 lb (64.864 kg)  12/22/15 144 lb (65.318 kg)    Physical Exam  Constitutional: She is oriented to person, place, and time. She appears well-developed and well-nourished. No distress.  HENT:  Head: Normocephalic and atraumatic.  Neck: No JVD present.  Cardiovascular: Normal rate, regular rhythm and normal heart sounds.   No murmur heard. Pulmonary/Chest: Effort normal and breath sounds normal. She has no wheezes. She has no rales.  Abdominal: Soft. There is no tenderness.  Musculoskeletal: She exhibits no edema.  Neurological: She is alert and oriented to person, place, and time.  Skin: Skin is warm and dry.  Psychiatric: She has a normal mood and affect.    Studies/Labs Reviewed:    EKG:  EKG is  ordered today.  The ekg ordered today demonstrates NSR, HR 76, LBBB  Recent Labs: 04/23/2015: TSH 1.50 08/17/2015: ALT 14 12/11/2015: BUN 17; Creat 1.26*; Hemoglobin 12.5; Platelets 197; Potassium 4.1; Sodium 140   Recent Lipid Panel    Component Value Date/Time   CHOL 172 05/31/2013 0435    TRIG 142 05/31/2013 0435   HDL 49 05/31/2013 0435   CHOLHDL 3.5 05/31/2013 0435   VLDL 28 05/31/2013 0435   LDLCALC 95 05/31/2013 0435   LDLDIRECT 111* 02/10/2015 1145    Additional studies/ records that were reviewed today include:   RHC 12/16/15 Fick Cardiac Output  4.72 L/min    Fick Cardiac Output Index  2.9 (L/min)/BSA   RA A Wave  2 mmHg   RA V Wave  2 mmHg   RA Mean  1 mmHg   RV Systolic Pressure  19 mmHg   RV Diastolic Pressure  0 mmHg   RV EDP  2 mmHg   PA Systolic Pressure  15 mmHg   PA Diastolic Pressure  4 mmHg   PA Mean  10 mmHg   PW A Wave  4 mmHg   PW V Wave  2 mmHg   PW Mean  2 mmHg   QP/QS  0.96   TPVR Index  3.58 HRUI  1. Low intracardiac filling pressures 2. Preserved cardiac output The patient appears to have well compensated heart failure. She has very low filling pressures with preserved cardiac output. Suspect noncardiac cause of breathlessness in this patient with chronic asthma and deconditioning.  Echo 12/11/15 EF 20-25%, severe diffuse HK, marked systolic dyssynchrony, grade 1 diastolic dysfunction, mild MR  LHC (10/14):  Minor luminal irregularity in prox LAD, EF 35%  Echo (2/15):  EF 30-35%, diff HK, ant-sept AK, Gr 2 DD, mild MR, trivial TR  Nuclear (5/13):  Normal stress nuclear study. LV Ejection Fraction: 58%  Cardiac MRI (4/15):  1. Mild LVE, EF 34%, Diff HK, paradoxical septal motion c/w LBBB, small focal area of late gadolinium enhancement at basal anteroseptum - may represent RV volume overload or a focal infiltrative disease such as sarcoidosis, but certainly wouldn't explain the degree of of LV dysfunction. - c/w NICM, normal RV size and function (RVEF 61%), Mild MR    ASSESSMENT:    1. Chronic systolic heart failure ([place])  2. NICM (nonischemic cardiomyopathy) ([place])   3. Essential hypertension   4. ICD (implantable cardioverter-defibrillator), dual, in situ    PLAN:    In order of problems listed  above:  1.Chronic systolic heart failure -  Recent RHC with low intracardiac filling pressures.  She appeared to be well compensated from a CHF standpoint and she was continued on current Rx. She remains NYHA 2b-3.  Continue beta blocker, ARB, Arlyce Harman.  Her HR is 76.  We could consider Ivabridine.  For now, will hold off on starting this drug.  2.NICM (nonischemic cardiomyopathy): Continue beta blocker, ARB, spironolactone.  EF in 5/17 was 20-25%.   3.HTN - BP controlled.   4.s/p ICD - Follow up with EP as planned.   Medication Adjustments/Labs and Tests Ordered: Current medicines are reviewed at length with the patient today.  Concerns regarding medicines are outlined above.  Medication changes, Labs and Tests ordered today are outlined in the Patient Instructions noted below. Patient Instructions  Medication Instructions:  No changes.  See your medication list. Labwork: None Testing/Procedures: None Follow-Up: Dr. Sherren Mocha in 3 months.  Any Other Special Instructions Will Be Listed Below (If Applicable). If you need a refill on your cardiac medications before your next appointment, please call your pharmacy.    Signed, Richardson Dopp, PA-C  02/15/2016 11:56 AM    [facility] [address], [place], [place]  [address] Phone: [phone]; Fax: [phone]

## 2016-02-15 ENCOUNTER — Ambulatory Visit (INDEPENDENT_AMBULATORY_CARE_PROVIDER_SITE_OTHER): Payer: Medicare Other | Admitting: Physician Assistant

## 2016-02-15 ENCOUNTER — Encounter: Payer: Self-pay | Admitting: Physician Assistant

## 2016-02-15 VITALS — BP 108/60 | HR 76 | Ht 61.0 in | Wt 143.8 lb

## 2016-02-15 DIAGNOSIS — I5022 Chronic systolic (congestive) heart failure: Secondary | ICD-10-CM

## 2016-02-15 DIAGNOSIS — L218 Other seborrheic dermatitis: Secondary | ICD-10-CM | POA: Diagnosis not present

## 2016-02-15 DIAGNOSIS — Z95810 Presence of automatic (implantable) cardiac defibrillator: Secondary | ICD-10-CM | POA: Diagnosis not present

## 2016-02-15 DIAGNOSIS — I10 Essential (primary) hypertension: Secondary | ICD-10-CM

## 2016-02-15 DIAGNOSIS — I428 Other cardiomyopathies: Secondary | ICD-10-CM

## 2016-02-15 DIAGNOSIS — I429 Cardiomyopathy, unspecified: Secondary | ICD-10-CM | POA: Diagnosis not present

## 2016-02-15 DIAGNOSIS — L718 Other rosacea: Secondary | ICD-10-CM | POA: Diagnosis not present

## 2016-02-15 NOTE — Patient Instructions (Addendum)
Medication Instructions:  No changes.  See your medication list. Labwork: None Testing/Procedures: None Follow-Up: Dr. Sherren Mocha in 3 months.  Any Other Special Instructions Will Be Listed Below (If Applicable). If you need a refill on your cardiac medications before your next appointment, please call your pharmacy.

## 2016-02-19 DIAGNOSIS — H01004 Unspecified blepharitis left upper eyelid: Secondary | ICD-10-CM | POA: Diagnosis not present

## 2016-02-19 DIAGNOSIS — H01002 Unspecified blepharitis right lower eyelid: Secondary | ICD-10-CM | POA: Diagnosis not present

## 2016-02-19 DIAGNOSIS — H01001 Unspecified blepharitis right upper eyelid: Secondary | ICD-10-CM | POA: Diagnosis not present

## 2016-02-19 DIAGNOSIS — H00014 Hordeolum externum left upper eyelid: Secondary | ICD-10-CM | POA: Diagnosis not present

## 2016-02-22 ENCOUNTER — Other Ambulatory Visit: Payer: Self-pay | Admitting: Internal Medicine

## 2016-02-22 DIAGNOSIS — Z1231 Encounter for screening mammogram for malignant neoplasm of breast: Secondary | ICD-10-CM

## 2016-02-26 ENCOUNTER — Ambulatory Visit
Admission: RE | Admit: 2016-02-26 | Discharge: 2016-02-26 | Disposition: A | Discharge status: Home / Self Care | Payer: Medicare Other | Source: Ambulatory Visit | Attending: Internal Medicine | Admitting: Internal Medicine

## 2016-02-26 ENCOUNTER — Ambulatory Visit: Payer: Medicare Other | Admitting: Physician Assistant

## 2016-02-26 DIAGNOSIS — Z1231 Encounter for screening mammogram for malignant neoplasm of breast: Secondary | ICD-10-CM

## 2016-03-22 ENCOUNTER — Ambulatory Visit (INDEPENDENT_AMBULATORY_CARE_PROVIDER_SITE_OTHER): Payer: Medicare Other | Admitting: *Deleted

## 2016-03-22 ENCOUNTER — Encounter: Payer: Self-pay | Admitting: *Deleted

## 2016-03-22 VITALS — BP 134/60 | HR 78 | Temp 98.3°F | Ht 61.0 in | Wt 140.4 lb

## 2016-03-22 DIAGNOSIS — Z23 Encounter for immunization: Secondary | ICD-10-CM

## 2016-03-22 DIAGNOSIS — E1165 Type 2 diabetes mellitus with hyperglycemia: Secondary | ICD-10-CM | POA: Diagnosis not present

## 2016-03-22 DIAGNOSIS — Z0000 Encounter for general adult medical examination without abnormal findings: Secondary | ICD-10-CM | POA: Diagnosis not present

## 2016-03-22 LAB — POCT GLYCOSYLATED HEMOGLOBIN (HGB A1C): Hemoglobin A1C: 6.8

## 2016-03-22 NOTE — Progress Notes (Signed)
Subjective:   Veronica Shaw is a 65 y.o. female who presents for an Initial Medicare Annual Wellness Visit.  Cardiac Risk Factors include: advanced age (>51men, >58 women);diabetes mellitus;dyslipidemia;hypertension;smoking/ tobacco exposure     Objective:    Today's Vitals   03/22/16 0913 03/22/16 0933  BP: (!) 115/46 134/60  Pulse: 72 78  Temp: 98.3 F (36.8 C)   TempSrc: Oral   SpO2: 96% 98%  Weight: 140 lb 6.4 oz (63.7 kg)   Height: 5\' 1"  (1.549 m)   PainSc: 0-No pain    Body mass index is 26.53 kg/m. Vitals repeated after 2 minutes standing 134/60 P 78 SpO2 98%  Current Medications (verified) Outpatient Encounter Prescriptions as of 03/22/2016  Medication Sig  . albuterol (PROVENTIL HFA;VENTOLIN HFA) 108 (90 Base) MCG/ACT inhaler Inhale 2 puffs into the lungs every 4 (four) hours as needed for wheezing or shortness of breath.  [name] Kitchen albuterol (PROVENTIL) (2.5 MG/3ML) 0.083% nebulizer solution 2.5 mg.  . cholestyramine (QUESTRAN) 4 GM/DOSE powder   . EPINEPHrine (EPIPEN) 0.3 mg/0.3 mL SOAJ injection Inject 0.3 mg into the muscle as needed (allergic reaction). Reported on 12/22/2015  . furosemide (LASIX) 40 MG tablet Take 20 mg by mouth daily as needed for fluid (For swelling).   . gabapentin (NEURONTIN) 100 MG capsule Take 100 mg by mouth 3 times/day as needed-between meals & bedtime (pain).   . hydrocortisone 1 % ointment Apply 1 application topically 2 (two) times daily.  [name] Kitchen levocetirizine (XYZAL) 5 MG tablet Take 5 mg by mouth daily as needed for allergies.  . metFORMIN (GLUCOPHAGE) 500 MG tablet TAKE TWO TABLETS BY MOUTH TWICE DAILY WITH A MEAL  . metoprolol succinate (TOPROL-XL) 50 MG 24 hr tablet Take 50 mg by mouth daily. Take with or immediately following a meal.  . mometasone-formoterol (DULERA) 100-5 MCG/ACT AERO Inhale 1 puff into the lungs every morning.   . nitroGLYCERIN (NITROSTAT) 0.4 MG SL tablet Place 1 tablet (0.4 mg total) under the tongue every 5 (five)  minutes as needed for chest pain. Take one tablet and place under tongue if develop chest pain may repeat x2  . omeprazole (PRILOSEC) 40 MG capsule Take 1 capsule (40 mg total) by mouth daily.  . potassium chloride (K-DUR,KLOR-CON) 10 MEQ tablet Take 10 mEq by mouth daily as needed (Take with lasix as needed for swelling).  [name] Kitchen spironolactone (ALDACTONE) 25 MG tablet Take 25 mg by mouth every morning.  . triamcinolone (KENALOG) 0.025 % cream Apply 1 application topically 2 (two) times daily.  . valsartan (DIOVAN) 40 MG tablet Take 40 mg by mouth every morning.  . [DISCONTINUED] pantoprazole (PROTONIX) 40 MG tablet Take 40 mg by mouth daily.   No facility-administered encounter medications on file as of 03/22/2016.     Allergies (verified) Aspirin; Penicillins; Prempro [conj estrog-medroxyprogest ace]; Ace inhibitors; and Simvastatin   History: Past Medical History:  Diagnosis Date  . Asthma   . Chronic systolic CHF (congestive heart failure) ([place])    a. cMRI 4/15: EF 34% and findings - c/w NICM, normal RV size and function (RVEF 61%), Mild MR // b. Echo 2/15:  EF 30-35%, diff HK, ant-sept AK, Gr 2 DD, mild MR, trivial TR  //  c. Echo 5/17: EF 20-25%, severe diffuse HK, marked systolic dyssynchrony, grade 1 diastolic dysfunction, mild MR  //  d. RHC 5/17: Fick CO 2.9, RVSP 19, PASP 15, PW mean 2, low filing pressures and preserved CO   . Diabetes mellitus   .  Gastritis   . HTN (hypertension)   . Hyperlipidemia   . NICM (nonischemic cardiomyopathy) ([place])    a. Nuclear 5/13: Normal stress nuclear study. LV Ejection Fraction: 58%  //  b. LHC 10/14: Minor luminal irregularity in prox LAD, EF 35%   . Parkinson disease ([place])   . Plantar fasciitis   . Sleep apnea    was retested and no longer had it and so d/c CPAP  . Urticaria    Past Surgical History:  Procedure Laterality Date  . BUNIONECTOMY    . CARDIAC CATHETERIZATION N/A 12/16/2015   Procedure: Right Heart Cath;  Surgeon: Sherren Mocha, MD;  Location: [place] CV LAB;  Service: Cardiovascular;  Laterality: N/A;  . CHOLECYSTECTOMY    . IMPLANTABLE CARDIOVERTER DEFIBRILLATOR IMPLANT  11-25-13   MDT dual chamber ICD implanted by [name] for primary prevention  . IMPLANTABLE CARDIOVERTER DEFIBRILLATOR IMPLANT N/A 11/25/2013   Procedure: IMPLANTABLE CARDIOVERTER DEFIBRILLATOR IMPLANT;  Surgeon: [name], MD;  Location[facility] CATH LAB;  Service: Cardiovascular;  Laterality: N/A;  . LEFT AND RIGHT HEART CATHETERIZATION WITH CORONARY ANGIOGRAM N/A 05/31/2013   Procedure: LEFT AND RIGHT HEART CATHETERIZATION WITH CORONARY ANGIOGRAM;  Surgeon: [name], MD;  Location: [facility] CATH LAB;  Service: Cardiovascular;  Laterality: N/A;  . TONSILLECTOMY    . TUBAL LIGATION     Family History  Problem Relation Age of Onset  . Coronary artery disease Father     Died age 42  . Heart attack Father   . Diabetes Father   . Coronary artery disease Mother     Died age 72  . Heart attack Mother   . Diabetes Mother   . Stroke Mother   . Parkinson's disease Sister   . Heart disease Sister   . Diabetes Son 37    T1DM  . Diabetes Sister   . Heart disease Sister   . Diabetes Sister   . Heart disease Sister    Social History   Occupational History  . retired     Quarry manager   Social History Main Topics  . Smoking status: Never Smoker  . Smokeless tobacco: Never Used     Comment: exposed to smoke during child hood (parents)  . Alcohol use No  . Drug use: No  . Sexual activity: Yes    Birth control/ protection: Surgical, Post-menopausal    Tobacco Counseling Patient has never smoked and has no plans to start.  Activities of Daily Living In your present state of health, do you have any difficulty performing the following activities: 03/22/2016 12/16/2015  Hearing? Y N  Vision? N N  Difficulty concentrating or making decisions? Y N  Walking or climbing stairs? Y Y  Dressing or bathing? N N  Doing errands, shopping? N -    Preparing Food and eating ? N -  Using the Toilet? N -  In the past six months, have you accidently leaked urine? Y -  Do you have problems with loss of bowel control? Y -  Managing your Medications? Y -  Managing your Finances? Y -  Housekeeping or managing your Housekeeping? N -  Some recent data might be hidden  Home Safety:  My home has a working smoke alarm:  Yes X 1           My home throw rugs have been fastened down to the floor or removed:  Removed I have non-slip mats in the bathtub and shower:  Yes  All my home's stairs have railings or bannisters: One level home with no inside or outside stairs         My home's floors, stairs and hallways are free from clutter, wires and cords:  Yes        Immunizations and Health Maintenance Immunization History  Administered Date(s) Administered  . Influenza Split 06/17/2011, 04/18/2012  . Influenza Whole 05/17/2007, 06/14/2010  . Influenza,inj,Quad PF,36+ Mos 05/20/2014, 06/04/2015  . Influenza,inj,Quad PF,6-35 Mos 05/03/2013  . Pneumococcal Conjugate-13 05/20/2014  . Pneumococcal Polysaccharide-23 05/17/2007, 06/01/2013  . Td 03/01/2006  . Zoster 08/01/2009   Health Maintenance Due  Topic Date Due  . FOOT EXAM  05/21/2015  . OPHTHALMOLOGY EXAM  09/02/2015  . TETANUS/TDAP  03/01/2016  . INFLUENZA VACCINE  03/01/2016  . HEMOGLOBIN A1C  03/15/2016   Foot exam performed today  Diabetic Foot Exam - Simple   Simple Foot Form Diabetic Foot exam was performed with the following findings:  Yes 03/22/2016 10:00 AM  Visual Inspection See comments:  Yes Sensation Testing See comments:  Yes Pulse Check Posterior Tibialis and Dorsalis pulse intact bilaterally:  Yes Comments Bunion noted outer aspect of left first metatarsal Intact to touch and monofilament testing on left.  No sensation to monofilament testing on right 1st and 5th phalanges, others on right intact DUCATTE, Orvis Brill, RN     Patient will call to  schedule eye exam with Dr. Prudencio Burly Patient will obtain TDaP at local pharmacy HgbA1c drawn today Flu shot administered today Patient states zostavax was given in 2011 at [facility] on [address] and [place] colonoscopy was done in June 2017 at [place]  Patient Care Team: Rogue Bussing, MD as PCP - General (Family Medicine) Katy Apo, MD as Consulting Physician (Ophthalmology) Sherren Mocha, MD as Consulting Physician (Cardiology) Ludwig Clarks, DO as Consulting Physician (Neurology) Tanda Rockers, MD as Consulting Physician (Pulmonary Disease)  Indicate any recent Medical Services you may have received from other than [facility] providers in the past year (date may be approximate).     Assessment:   This is a routine wellness examination for Euna.   Hearing/Vision screen  Hearing Screening   Method: Audiometry   125Hz  250Hz  500Hz  1000Hz  2000Hz  3000Hz  4000Hz  6000Hz  8000Hz   Right ear:   40 40 40  40    Left ear:   40 40 40  40      Dietary issues and exercise activities discussed: Current Exercise Habits: Home exercise routine;Structured exercise class, Type of exercise: stretching;yoga (stationary bike), Time (Minutes): 15, Frequency (Times/Week): 7, Weekly Exercise (Minutes/Week): 105, Intensity: Moderate, Exercise limited by: respiratory conditions(s);cardiac condition(s);neurologic condition(s)  Goals    . Blood Pressure < 140/90    . HEMOGLOBIN A1C < 7.0 (pt-stated)    . LDL CALC < 100    . Weight (lb) < 133 lb (60.3 kg)          5% weight loss      Depression Screen PHQ 2/9 Scores 03/22/2016 02/12/2016 11/09/2015 08/17/2015 06/04/2015 02/16/2015 02/10/2015  PHQ - 2 Score 0 0 0 0 0 0 0  PHQ- 9 Score - - - - - - -  Exception Documentation - Other- indicate reason in comment box - - - - -    Fall Risk Fall Risk  03/22/2016 02/12/2016 11/09/2015 06/04/2015 02/16/2015  Falls in the past year? Yes No No No -  Number falls in past yr: 2 or more - - - 2 or more  Injury with Fall? No - - - No  Risk Factor Category  High Fall Risk - - - -  Risk for fall due to : History of fall(s);Impaired balance/gait;Impaired mobility Other (Comment) - - -  Follow up Education provided;Falls prevention discussed - - - -   Cognitive Function: Mini-Cog failed with score 2/5  TUG Test:  Done in 11 seconds. Patient used both hands to push out of chair and to sit back down.Patient walks with cane but left it in the car for this visit. Discussed importance of using cane at all times to decrease risk of fall.   Screening Tests Health Maintenance  Topic Date Due  . FOOT EXAM  05/21/2015  . OPHTHALMOLOGY EXAM  09/02/2015  . TETANUS/TDAP  03/01/2016  . INFLUENZA VACCINE  03/01/2016  . HEMOGLOBIN A1C  03/15/2016  . MAMMOGRAM  02/25/2018  . PNA vac Low Risk Adult (2 of 2 - PPSV23) 06/01/2018  . COLONOSCOPY  05/19/2021  . DEXA SCAN  Completed  . ZOSTAVAX  Addressed  . Hepatitis C Screening  Completed      Plan:     During the course of the visit, Jacee was educated and counseled about the following appropriate screening and preventive services:   Vaccines to include Pneumoccal, Influenza, Td, Zostavax  Cardiovascular disease screening  Colorectal cancer screening  Bone density screening  Diabetes screening  Glaucoma screening  Mammography/PAP  Nutrition counseling  Patient Instructions (the written plan) were given to the patient.    Velora Heckler, RN   03/22/2016   I have reviewed this visit and discussed with Howell Rucks, RN, BSN, and agree with her documentation. Olene Floss, MD [place], PGY-2

## 2016-03-22 NOTE — Patient Instructions (Signed)
Fat and Cholesterol Restricted Diet High levels of fat and cholesterol in your blood may lead to various health problems, such as diseases of the heart, blood vessels, gallbladder, liver, and pancreas. Fats are concentrated sources of energy that come in various forms. Certain types of fat, including saturated fat, may be harmful in excess. Cholesterol is a substance needed by your body in small amounts. Your body makes all the cholesterol it needs. Excess cholesterol comes from the food you eat. When you have high levels of cholesterol and saturated fat in your blood, health problems can develop because the excess fat and cholesterol will gather along the walls of your blood vessels, causing them to narrow. Choosing the right foods will help you control your intake of fat and cholesterol. This will help keep the levels of these substances in your blood within normal limits and reduce your risk of disease. WHAT IS MY PLAN? Your health care provider recommends that you:  Get no more than __________ % of the total calories in your daily diet from fat.  Limit your intake of saturated fat to less than ______% of your total calories each day.  Limit the amount of cholesterol in your diet to less than _________mg per day. WHAT TYPES OF FAT SHOULD I CHOOSE?  Choose healthy fats more often. Choose monounsaturated and polyunsaturated fats, such as olive and canola oil, flaxseeds, walnuts, almonds, and seeds.  Eat more omega-3 fats. Good choices include salmon, mackerel, sardines, tuna, flaxseed oil, and ground flaxseeds. Aim to eat fish at least two times a week.  Limit saturated fats. Saturated fats are primarily found in animal products, such as meats, butter, and cream. Plant sources of saturated fats include palm oil, palm kernel oil, and coconut oil.  Avoid foods with partially hydrogenated oils in them. These contain trans fats. Examples of foods that contain trans fats are stick margarine, some  tub margarines, cookies, crackers, and other baked goods. WHAT GENERAL GUIDELINES DO I NEED TO FOLLOW? These guidelines for healthy eating will help you control your intake of fat and cholesterol:  Check food labels carefully to identify foods with trans fats or high amounts of saturated fat.  Fill one half of your plate with vegetables and green salads.  Fill one fourth of your plate with whole grains. Look for the word "whole" as the first word in the ingredient list.  Fill one fourth of your plate with lean protein foods.  Limit fruit to two servings a day. Choose fruit instead of juice.  Eat more foods that contain soluble fiber. Examples of foods that contain this type of fiber are apples, broccoli, carrots, beans, peas, and barley. Aim to get 20-30 g of fiber per day.  Eat more home-cooked food and less restaurant, buffet, and fast food.  Limit or avoid alcohol.  Limit foods high in starch and sugar.  Limit fried foods.  Cook foods using methods other than frying. Baking, boiling, grilling, and broiling are all great options.  Lose weight if you are overweight. Losing just 5-10% of your initial body weight can help your overall health and prevent diseases such as diabetes and heart disease. WHAT FOODS CAN I EAT? Grains Whole grains, such as whole wheat or whole grain breads, crackers, cereals, and pasta. Unsweetened oatmeal, bulgur, barley, quinoa, or brown rice. Corn or whole wheat flour tortillas. Vegetables Fresh or frozen vegetables (raw, steamed, roasted, or grilled). Green salads. Fruits All fresh, canned (in natural juice), or frozen fruits. Meat and  Other Protein Products Ground beef (85% or leaner), grass-fed beef, or beef trimmed of fat. Skinless chicken or [place]. Ground chicken or [place]. Pork trimmed of fat. All fish and seafood. Eggs. Dried beans, peas, or lentils. Unsalted nuts or seeds. Unsalted canned or dry beans. Dairy Low-fat dairy products, such as skim  or 1% milk, 2% or reduced-fat cheeses, low-fat ricotta or cottage cheese, or plain low-fat yogurt. Fats and Oils Tub margarines without trans fats. Light or reduced-fat mayonnaise and salad dressings. Avocado. Olive, canola, sesame, or safflower oils. Natural peanut or almond butter (choose ones without added sugar and oil). The items listed above may not be a complete list of recommended foods or beverages. Contact your dietitian for more options. WHAT FOODS ARE NOT RECOMMENDED? Grains White bread. White pasta. White rice. Cornbread. Bagels, pastries, and croissants. Crackers that contain trans fat. Vegetables White potatoes. Corn. Creamed or fried vegetables. Vegetables in a cheese sauce. Fruits Dried fruits. Canned fruit in light or heavy syrup. Fruit juice. Meat and Other Protein Products Fatty cuts of meat. Ribs, chicken wings, bacon, sausage, bologna, salami, chitterlings, fatback, hot dogs, bratwurst, and packaged luncheon meats. Liver and organ meats. Dairy Whole or 2% milk, cream, half-and-half, and cream cheese. Whole milk cheeses. Whole-fat or sweetened yogurt. Full-fat cheeses. Nondairy creamers and whipped toppings. Processed cheese, cheese spreads, or cheese curds. Sweets and Desserts Corn syrup, sugars, honey, and molasses. Candy. Jam and jelly. Syrup. Sweetened cereals. Cookies, pies, cakes, donuts, muffins, and ice cream. Fats and Oils Butter, stick margarine, lard, shortening, ghee, or bacon fat. Coconut, palm kernel, or palm oils. Beverages Alcohol. Sweetened drinks (such as sodas, lemonade, and fruit drinks or punches). The items listed above may not be a complete list of foods and beverages to avoid. Contact your dietitian for more information.   This information is not intended to replace advice given to you by your health care provider. Make sure you discuss any questions you have with your health care provider.   Document Released: 07/18/2005 Document Revised:  08/08/2014 Document Reviewed: 10/16/2013 Elsevier Interactive Patient Education 2016 [place].  Diabetes and Foot Care Diabetes may cause you to have problems because of poor blood supply (circulation) to your feet and legs. This may cause the skin on your feet to become thinner, break easier, and heal more slowly. Your skin may become dry, and the skin may peel and crack. You may also have nerve damage in your legs and feet causing decreased feeling in them. You may not notice minor injuries to your feet that could lead to infections or more serious problems. Taking care of your feet is one of the most important things you can do for yourself.  HOME CARE INSTRUCTIONS  Wear shoes at all times, even in the house. Do not go barefoot. Bare feet are easily injured.  Check your feet daily for blisters, cuts, and redness. If you cannot see the bottom of your feet, use a mirror or ask someone for help.  Wash your feet with warm water (do not use hot water) and mild soap. Then pat your feet and the areas between your toes until they are completely dry. Do not soak your feet as this can dry your skin.  Apply a moisturizing lotion or petroleum jelly (that does not contain alcohol and is unscented) to the skin on your feet and to dry, brittle toenails. Do not apply lotion between your toes.  Trim your toenails straight across. Do not dig under them or around  the cuticle. File the edges of your nails with an emery board or nail file.  Do not cut corns or calluses or try to remove them with medicine.  Wear clean socks or stockings every day. Make sure they are not too tight. Do not wear knee-high stockings since they may decrease blood flow to your legs.  Wear shoes that fit properly and have enough cushioning. To break in new shoes, wear them for just a few hours a day. This prevents you from injuring your feet. Always look in your shoes before you put them on to be sure there are no objects  inside.  Do not cross your legs. This may decrease the blood flow to your feet.  If you find a minor scrape, cut, or break in the skin on your feet, keep it and the skin around it clean and dry. These areas may be cleansed with mild soap and water. Do not cleanse the area with peroxide, alcohol, or iodine.  When you remove an adhesive bandage, be sure not to damage the skin around it.  If you have a wound, look at it several times a day to make sure it is healing.  Do not use heating pads or hot water bottles. They may burn your skin. If you have lost feeling in your feet or legs, you may not know it is happening until it is too late.  Make sure your health care provider performs a complete foot exam at least annually or more often if you have foot problems. Report any cuts, sores, or bruises to your health care provider immediately. SEEK MEDICAL CARE IF:   You have an injury that is not healing.  You have cuts or breaks in the skin.  You have an ingrown nail.  You notice redness on your legs or feet.  You feel burning or tingling in your legs or feet.  You have pain or cramps in your legs and feet.  Your legs or feet are numb.  Your feet always feel cold. SEEK IMMEDIATE MEDICAL CARE IF:   There is increasing redness, swelling, or pain in or around a wound.  There is a red line that goes up your leg.  Pus is coming from a wound.  You develop a fever or as directed by your health care provider.  You notice a bad smell coming from an ulcer or wound.   This information is not intended to replace advice given to you by your health care provider. Make sure you discuss any questions you have with your health care provider.   Document Released: 07/15/2000 Document Revised: 03/20/2013 Document Reviewed: 12/25/2012 Elsevier Interactive Patient Education 2016 [place] in the Home  Falls can cause injuries. They can happen to people of all ages. There are  many things you can do to make your home safe and to help prevent falls.  WHAT CAN I DO ON THE OUTSIDE OF MY HOME?  Regularly fix the edges of walkways and driveways and fix any cracks.  Remove anything that might make you trip as you walk through a door, such as a raised step or threshold.  Trim any bushes or trees on the path to your home.  Use bright outdoor lighting.  Clear any walking paths of anything that might make someone trip, such as rocks or tools.  Regularly check to see if handrails are loose or broken. Make sure that both sides of any steps have handrails.  Any  raised decks and porches should have guardrails on the edges.  Have any leaves, snow, or ice cleared regularly.  Use sand or salt on walking paths during winter.  Clean up any spills in your garage right away. This includes oil or grease spills. WHAT CAN I DO IN THE BATHROOM?   Use night lights.  Install grab bars by the toilet and in the tub and shower. Do not use towel bars as grab bars.  Use non-skid mats or decals in the tub or shower.  If you need to sit down in the shower, use a plastic, non-slip stool.  Keep the floor dry. Clean up any water that spills on the floor as soon as it happens.  Remove soap buildup in the tub or shower regularly.  Attach bath mats securely with double-sided non-slip rug tape.  Do not have throw rugs and other things on the floor that can make you trip. WHAT CAN I DO IN THE BEDROOM?  Use night lights.  Make sure that you have a light by your bed that is easy to reach.  Do not use any sheets or blankets that are too big for your bed. They should not hang down onto the floor.  Have a firm chair that has side arms. You can use this for support while you get dressed.  Do not have throw rugs and other things on the floor that can make you trip. WHAT CAN I DO IN THE KITCHEN?  Clean up any spills right away.  Avoid walking on wet floors.  Keep items that you use  a lot in easy-to-reach places.  If you need to reach something above you, use a strong step stool that has a grab bar.  Keep electrical cords out of the way.  Do not use floor polish or wax that makes floors slippery. If you must use wax, use non-skid floor wax.  Do not have throw rugs and other things on the floor that can make you trip. WHAT CAN I DO WITH MY STAIRS?  Do not leave any items on the stairs.  Make sure that there are handrails on both sides of the stairs and use them. Fix handrails that are broken or loose. Make sure that handrails are as long as the stairways.  Check any carpeting to make sure that it is firmly attached to the stairs. Fix any carpet that is loose or worn.  Avoid having throw rugs at the top or bottom of the stairs. If you do have throw rugs, attach them to the floor with carpet tape.  Make sure that you have a light switch at the top of the stairs and the bottom of the stairs. If you do not have them, ask someone to add them for you. WHAT ELSE CAN I DO TO HELP PREVENT FALLS?  Wear shoes that:  Do not have high heels.  Have rubber bottoms.  Are comfortable and fit you well.  Are closed at the toe. Do not wear sandals.  If you use a stepladder:  Make sure that it is fully opened. Do not climb a closed stepladder.  Make sure that both sides of the stepladder are locked into place.  Ask someone to hold it for you, if possible.  Clearly mark and make sure that you can see:  Any grab bars or handrails.  First and last steps.  Where the edge of each step is.  Use tools that help you move around (mobility aids) if they  are needed. These include:  Canes.  Walkers.  Scooters.  Crutches.  Turn on the lights when you go into a dark area. Replace any light bulbs as soon as they burn out.  Set up your furniture so you have a clear path. Avoid moving your furniture around.  If any of your floors are uneven, fix them.  If there are any  pets around you, be aware of where they are.  Review your medicines with your doctor. Some medicines can make you feel dizzy. This can increase your chance of falling. Ask your doctor what other things that you can do to help prevent falls.   This information is not intended to replace advice given to you by your health care provider. Make sure you discuss any questions you have with your health care provider.   Document Released: 05/14/2009 Document Revised: 12/02/2014 Document Reviewed: 08/22/2014 Elsevier Interactive Patient Education 2016 [place] Maintenance, Female Adopting a healthy lifestyle and getting preventive care can go a long way to promote health and wellness. Talk with your health care provider about what schedule of regular examinations is right for you. This is a good chance for you to check in with your provider about disease prevention and staying healthy. In between checkups, there are plenty of things you can do on your own. Experts have done a lot of research about which lifestyle changes and preventive measures are most likely to keep you healthy. Ask your health care provider for more information. WEIGHT AND DIET  Eat a healthy diet  Be sure to include plenty of vegetables, fruits, low-fat dairy products, and lean protein.  Do not eat a lot of foods high in solid fats, added sugars, or salt.  Get regular exercise. This is one of the most important things you can do for your health.  Most adults should exercise for at least 150 minutes each week. The exercise should increase your heart rate and make you sweat (moderate-intensity exercise).  Most adults should also do strengthening exercises at least twice a week. This is in addition to the moderate-intensity exercise.  Maintain a healthy weight  Body mass index (BMI) is a measurement that can be used to identify possible weight problems. It estimates body fat based on height and weight. Your health  care provider can help determine your BMI and help you achieve or maintain a healthy weight.  For females 68 years of age and older:   A BMI below 18.5 is considered underweight.  A BMI of 18.5 to 24.9 is normal.  A BMI of 25 to 29.9 is considered overweight.  A BMI of 30 and above is considered obese.  Watch levels of cholesterol and blood lipids  You should start having your blood tested for lipids and cholesterol at 67 years of age, then have this test every 5 years.  You may need to have your cholesterol levels checked more often if:  Your lipid or cholesterol levels are high.  You are older than 67 years of age.  You are at high risk for heart disease.  CANCER SCREENING   Lung Cancer  Lung cancer screening is recommended for adults 28-67 years old who are at high risk for lung cancer because of a history of smoking.  A yearly low-dose CT scan of the lungs is recommended for people who:  Currently smoke.  Have quit within the past 15 years.  Have at least a 30-pack-year history of smoking. A pack year is  smoking an average of one pack of cigarettes a day for 1 year.  Yearly screening should continue until it has been 15 years since you quit.  Yearly screening should stop if you develop a health problem that would prevent you from having lung cancer treatment.  Breast Cancer  Practice breast self-awareness. This means understanding how your breasts normally appear and feel.  It also means doing regular breast self-exams. Let your health care provider know about any changes, no matter how small.  If you are in your 20s or 30s, you should have a clinical breast exam (CBE) by a health care provider every 1-3 years as part of a regular health exam.  If you are 41 or older, have a CBE every year. Also consider having a breast X-ray (mammogram) every year.  If you have a family history of breast cancer, talk to your health care provider about genetic  screening.  If you are at high risk for breast cancer, talk to your health care provider about having an MRI and a mammogram every year.  Breast cancer gene (BRCA) assessment is recommended for women who have family members with BRCA-related cancers. BRCA-related cancers include:  Breast.  Ovarian.  Tubal.  Peritoneal cancers.  Results of the assessment will determine the need for genetic counseling and BRCA1 and BRCA2 testing. Cervical Cancer Your health care provider may recommend that you be screened regularly for cancer of the pelvic organs (ovaries, uterus, and vagina). This screening involves a pelvic examination, including checking for microscopic changes to the surface of your cervix (Pap test). You may be encouraged to have this screening done every 3 years, beginning at age 58.  For women ages 15-65, health care providers may recommend pelvic exams and Pap testing every 3 years, or they may recommend the Pap and pelvic exam, combined with testing for human papilloma virus (HPV), every 5 years. Some types of HPV increase your risk of cervical cancer. Testing for HPV may also be done on women of any age with unclear Pap test results.  Other health care providers may not recommend any screening for nonpregnant women who are considered low risk for pelvic cancer and who do not have symptoms. Ask your health care provider if a screening pelvic exam is right for you.  If you have had past treatment for cervical cancer or a condition that could lead to cancer, you need Pap tests and screening for cancer for at least 20 years after your treatment. If Pap tests have been discontinued, your risk factors (such as having a new sexual partner) need to be reassessed to determine if screening should resume. Some women have medical problems that increase the chance of getting cervical cancer. In these cases, your health care provider may recommend more frequent screening and Pap tests. Colorectal  Cancer  This type of cancer can be detected and often prevented.  Routine colorectal cancer screening usually begins at 67 years of age and continues through 67 years of age.  Your health care provider may recommend screening at an earlier age if you have risk factors for colon cancer.  Your health care provider may also recommend using home test kits to check for hidden blood in the stool.  A small camera at the end of a tube can be used to examine your colon directly (sigmoidoscopy or colonoscopy). This is done to check for the earliest forms of colorectal cancer.  Routine screening usually begins at age 100.  Direct examination of the  colon should be repeated every 5-10 years through 67 years of age. However, you may need to be screened more often if early forms of precancerous polyps or small growths are found. Skin Cancer  Check your skin from head to toe regularly.  Tell your health care provider about any new moles or changes in moles, especially if there is a change in a mole's shape or color.  Also tell your health care provider if you have a mole that is larger than the size of a pencil eraser.  Always use sunscreen. Apply sunscreen liberally and repeatedly throughout the day.  Protect yourself by wearing long sleeves, pants, a wide-brimmed hat, and sunglasses whenever you are outside. HEART DISEASE, DIABETES, AND HIGH BLOOD PRESSURE   High blood pressure causes heart disease and increases the risk of stroke. High blood pressure is more likely to develop in:  People who have blood pressure in the high end of the normal range (130-139/85-89 mm Hg).  People who are overweight or obese.  People who are African American.  If you are 32-90 years of age, have your blood pressure checked every 3-5 years. If you are 61 years of age or older, have your blood pressure checked every year. You should have your blood pressure measured twice--once when you are at a hospital or clinic,  and once when you are not at a hospital or clinic. Record the average of the two measurements. To check your blood pressure when you are not at a hospital or clinic, you can use:  An automated blood pressure machine at a pharmacy.  A home blood pressure monitor.  If you are between 4 years and 78 years old, ask your health care provider if you should take aspirin to prevent strokes.  Have regular diabetes screenings. This involves taking a blood sample to check your fasting blood sugar level.  If you are at a normal weight and have a low risk for diabetes, have this test once every three years after 67 years of age.  If you are overweight and have a high risk for diabetes, consider being tested at a younger age or more often. PREVENTING INFECTION  Hepatitis B  If you have a higher risk for hepatitis B, you should be screened for this virus. You are considered at high risk for hepatitis B if:  You were born in a country where hepatitis B is common. Ask your health care provider which countries are considered high risk.  Your parents were born in a high-risk country, and you have not been immunized against hepatitis B (hepatitis B vaccine).  You have HIV or AIDS.  You use needles to inject street drugs.  You live with someone who has hepatitis B.  You have had sex with someone who has hepatitis B.  You get hemodialysis treatment.  You take certain medicines for conditions, including cancer, organ transplantation, and autoimmune conditions. Hepatitis C  Blood testing is recommended for:  Everyone born from [birth year] through 1967.  Anyone with known risk factors for hepatitis C. Sexually transmitted infections (STIs)  You should be screened for sexually transmitted infections (STIs) including gonorrhea and chlamydia if:  You are sexually active and are younger than 67 years of age.  You are older than 67 years of age and your health care provider tells you that you are at risk  for this type of infection.  Your sexual activity has changed since you were last screened and you are at an increased risk  for chlamydia or gonorrhea. Ask your health care provider if you are at risk.  If you do not have HIV, but are at risk, it may be recommended that you take a prescription medicine daily to prevent HIV infection. This is called pre-exposure prophylaxis (PrEP). You are considered at risk if:  You are sexually active and do not regularly use condoms or know the HIV status of your partner(s).  You take drugs by injection.  You are sexually active with a partner who has HIV. Talk with your health care provider about whether you are at high risk of being infected with HIV. If you choose to begin PrEP, you should first be tested for HIV. You should then be tested every 3 months for as long as you are taking PrEP.  PREGNANCY   If you are premenopausal and you may become pregnant, ask your health care provider about preconception counseling.  If you may become pregnant, take 400 to 800 micrograms (mcg) of folic acid every day.  If you want to prevent pregnancy, talk to your health care provider about birth control (contraception). OSTEOPOROSIS AND MENOPAUSE   Osteoporosis is a disease in which the bones lose minerals and strength with aging. This can result in serious bone fractures. Your risk for osteoporosis can be identified using a bone density scan.  If you are 16 years of age or older, or if you are at risk for osteoporosis and fractures, ask your health care provider if you should be screened.  Ask your health care provider whether you should take a calcium or vitamin D supplement to lower your risk for osteoporosis.  Menopause may have certain physical symptoms and risks.  Hormone replacement therapy may reduce some of these symptoms and risks. Talk to your health care provider about whether hormone replacement therapy is right for you.  HOME CARE INSTRUCTIONS    Schedule regular health, dental, and eye exams.  Stay current with your immunizations.   Do not use any tobacco products including cigarettes, chewing tobacco, or electronic cigarettes.  If you are pregnant, do not drink alcohol.  If you are breastfeeding, limit how much and how often you drink alcohol.  Limit alcohol intake to no more than 1 drink per day for nonpregnant women. One drink equals 12 ounces of beer, 5 ounces of wine, or 1 ounces of hard liquor.  Do not use street drugs.  Do not share needles.  Ask your health care provider for help if you need support or information about quitting drugs.  Tell your health care provider if you often feel depressed.  Tell your health care provider if you have ever been abused or do not feel safe at home.   This information is not intended to replace advice given to you by your health care provider. Make sure you discuss any questions you have with your health care provider.   Document Released: 01/31/2011 Document Revised: 08/08/2014 Document Reviewed: 06/19/2013 Elsevier Interactive Patient Education [employer].

## 2016-03-24 DIAGNOSIS — Z01419 Encounter for gynecological examination (general) (routine) without abnormal findings: Secondary | ICD-10-CM | POA: Diagnosis not present

## 2016-03-24 DIAGNOSIS — Z6825 Body mass index (BMI) 25.0-25.9, adult: Secondary | ICD-10-CM | POA: Diagnosis not present

## 2016-03-24 DIAGNOSIS — Z124 Encounter for screening for malignant neoplasm of cervix: Secondary | ICD-10-CM | POA: Diagnosis not present

## 2016-04-11 ENCOUNTER — Other Ambulatory Visit: Payer: Self-pay

## 2016-04-11 MED ORDER — POTASSIUM CHLORIDE CRYS ER 10 MEQ PO TBCR: 10.0000 meq | EXTENDED_RELEASE_TABLET | Freq: Every day | ORAL | 6 refills | Status: DC | PRN

## 2016-04-14 ENCOUNTER — Ambulatory Visit (INDEPENDENT_AMBULATORY_CARE_PROVIDER_SITE_OTHER): Payer: Medicare Other | Admitting: Internal Medicine

## 2016-04-14 ENCOUNTER — Encounter: Payer: Self-pay | Admitting: Internal Medicine

## 2016-04-14 VITALS — BP 138/52 | HR 79 | Temp 98.7°F | Wt 144.0 lb

## 2016-04-14 DIAGNOSIS — R1032 Left lower quadrant pain: Secondary | ICD-10-CM

## 2016-04-14 DIAGNOSIS — R102 Pelvic and perineal pain: Secondary | ICD-10-CM | POA: Insufficient documentation

## 2016-04-14 DIAGNOSIS — R300 Dysuria: Secondary | ICD-10-CM

## 2016-04-14 LAB — BASIC METABOLIC PANEL WITH GFR
BUN: 20 mg/dL (ref 7–25)
CO2: 27 mmol/L (ref 20–31)
Calcium: 10.2 mg/dL (ref 8.6–10.4)
Chloride: 105 mmol/L (ref 98–110)
Creat: 1.17 mg/dL — ABNORMAL HIGH (ref 0.50–0.99)
GFR, Est African American: 56 mL/min — ABNORMAL LOW (ref >=60)
GFR, Est Non African American: 48 mL/min — ABNORMAL LOW (ref >=60)
Glucose, Bld: 113 mg/dL — ABNORMAL HIGH (ref 65–99)
Potassium: 4.4 mmol/L (ref 3.5–5.3)
Sodium: 140 mmol/L (ref 135–146)

## 2016-04-14 LAB — POCT URINALYSIS DIPSTICK
Bilirubin, UA: NEGATIVE
Blood, UA: NEGATIVE
Glucose, UA: NEGATIVE
Ketones, UA: NEGATIVE
Nitrite, UA: NEGATIVE
Protein, UA: NEGATIVE
Spec Grav, UA: 1.015
Urobilinogen, UA: 0.2
pH, UA: 5.5

## 2016-04-14 LAB — POCT UA - MICROSCOPIC ONLY

## 2016-04-14 NOTE — Patient Instructions (Signed)
You were seen here for abdominal pain and lower back pain. We will rule out urinary tract infection. We will let you know about the results of the UA later today. If those results are normal, I would likely be leaning towards the lower back pain being associated with your muscles. However, if you continue to have this pain I want to come in to be reevaluated in a 1-2 weeks.

## 2016-04-14 NOTE — Progress Notes (Signed)
   [facility] Kerrin Mo, MD Phone: [phone]  Reason For Visit: SDA for UTI  # Patient presenting with left lower back pain for about two weeks. Indicates increased freq of urination at night and pressure associated with urination. However denies any dysuria. No changes in ordor. No nausea, vomiting, fever or chills.  Patient has hx of UTIs and believes she could be having another UTI.  No vaginal discharge or vaginal bleeding.  No pelvic pain.  Back pain often worse with certain movements. No radiation of back pain, no numbness or tingling or weakness associated with this pain.   Past Medical History Reviewed problem list.  Medications- reviewed and updated No additions to family history Social history- patient is a non-smoker  Objective: BP (!) 138/52 (BP Location: Left Arm, Patient Position: Sitting, Cuff Size: Normal)   Pulse 79   Temp 98.7 F (37.1 C) (Oral)   Wt 144 lb (65.3 kg)   LMP 08/01/2000 (Approximate)   BMI 27.21 kg/m  Gen: NAD, alert, cooperative with exam Cardio: regular rate and rhythm, S1S2 heard, no murmurs appreciated GI: soft, slight suprapubic pain on palpation, bowel sounds present,  MSK: Normal gait and station, left lower back pain  Neuro: Strength and sensation grossly intact    Assessment/Plan: See problem based a/p  Suprapubic pain Patient presenting with increased urgency of urination and pressure associated with urination. She also indicates having some lower back pain associated with this. On physical exam she was positive for suprapubic pain that she did not mention it during history of present illness. Will rule out UTI, however possible that lower back pain is musculoskeletal in origin.  - POCT UA - Microscopic Only - POCT urinalysis dipstick - BASIC METABOLIC PANEL WITH GFR- patient desired a recheck of her creatinine she is concerned about her kidney function - Discussed that if UA is negative, patient to follow  up if 1-2 weeks if back pain has not improved

## 2016-04-14 NOTE — Assessment & Plan Note (Addendum)
Patient presenting with increased urgency of urination and pressure associated with urination. She also indicates having some lower back pain associated with this. On physical exam she was positive for suprapubic pain that she did not mention it during history of present illness. Will rule out UTI, however possible that lower back pain is musculoskeletal in origin.  - POCT UA - Microscopic Only - POCT urinalysis dipstick - BASIC METABOLIC PANEL WITH GFR- patient desired a recheck of her creatinine she is concerned about her kidney function - Discussed that if UA is negative, patient to follow up if 1-2 weeks if back pain has not improved

## 2016-04-15 ENCOUNTER — Encounter: Payer: Self-pay | Admitting: Internal Medicine

## 2016-04-15 ENCOUNTER — Telehealth: Payer: Self-pay | Admitting: Internal Medicine

## 2016-04-15 NOTE — Telephone Encounter (Signed)
Called patient to let her know that UA was negative for signs of infection and the kidney function has slightly improved. Patient indicates the she is still having suprapubic pain as well as lower back pain. I told her if the pain is worsening or does not improve to go to the ED to get the pain reevaluated and possibly receive imaging. I also indicated that if she were to develop fever, chills, nausea, or vomiting, she should go to the ED for further evaluation.

## 2016-04-15 NOTE — Telephone Encounter (Signed)
Would like results from yesterdays blood work hs

## 2016-04-15 NOTE — Telephone Encounter (Signed)
Entered in error

## 2016-04-20 ENCOUNTER — Encounter: Payer: Self-pay | Admitting: Internal Medicine

## 2016-04-22 ENCOUNTER — Other Ambulatory Visit: Payer: Self-pay

## 2016-04-22 MED ORDER — SPIRONOLACTONE 25 MG PO TABS: 25.0000 mg | ORAL_TABLET | ORAL | 3 refills | Status: DC

## 2016-04-28 ENCOUNTER — Ambulatory Visit (INDEPENDENT_AMBULATORY_CARE_PROVIDER_SITE_OTHER): Payer: Medicare Other | Admitting: Family Medicine

## 2016-04-28 ENCOUNTER — Encounter: Payer: Self-pay | Admitting: Family Medicine

## 2016-04-28 VITALS — BP 133/46 | HR 73 | Temp 98.4°F | Wt 139.6 lb

## 2016-04-28 DIAGNOSIS — H65193 Other acute nonsuppurative otitis media, bilateral: Secondary | ICD-10-CM

## 2016-04-28 DIAGNOSIS — R51 Headache: Secondary | ICD-10-CM

## 2016-04-28 DIAGNOSIS — R519 Headache, unspecified: Secondary | ICD-10-CM

## 2016-04-28 DIAGNOSIS — M542 Cervicalgia: Secondary | ICD-10-CM | POA: Diagnosis not present

## 2016-04-28 DIAGNOSIS — H6593 Unspecified nonsuppurative otitis media, bilateral: Secondary | ICD-10-CM | POA: Insufficient documentation

## 2016-04-28 MED ORDER — FLUTICASONE PROPIONATE 50 MCG/ACT NA SUSP: 2.0000 | Freq: Every day | NASAL | 1 refills | Status: DC

## 2016-04-28 MED ORDER — KETOROLAC TROMETHAMINE 60 MG/2ML IM SOLN
60.0000 mg | Freq: Once | INTRAMUSCULAR | Status: AC
  Administered 2016-04-28: 60 mg via INTRAMUSCULAR

## 2016-04-28 NOTE — Progress Notes (Addendum)
Subjective:     Patient ID: Veronica Shaw, female   DOB: [birth year], 65 y.o.   MRN: [id number]  HPI [name] is a 65 year old female with a history of Parkinson's disease, HFrEF 20-25% s/p ICD, chronic back pain with radiculopathy, arthritis, acute bilateral ear effusion, in clinic for evaluation of headache.   Headache Patient reports a 5 day history of a 6/10 dull, intense headache across the posterior portion of the head that has since radiated to the front of the head and the preauricular areas bilaterally. She also endorses fatigue, decreased appetite, generalized neck pain, and lightheadedness/dizziness. The headache had a gradual onset and the patient denies any trauma, medication changes, or recent illness. She has tried warm compresses to the neck and tylenol with some relief. Of note, she has a history for bilateral ear effusions for which she was treated with antibiotic ear drops. Negative for fever/chills, N/V, congestive symptoms, vision changes, sore throat, hearing changes, bowel or urinary changes, rash, new numbness/tingling or muscle weakness. Some mild photophobia.   Social - never a smoker  Review of Systems  Neurological: Positive for headaches.   Pertinent positives and negatives per HPI.    Objective:   Physical Exam  Constitutional: She is oriented to person, place, and time. She appears well-developed and well-nourished. No distress.  HENT:  Head: Normocephalic and atraumatic.  Right Ear: External ear and ear canal normal. Tympanic membrane is not erythematous. A middle ear effusion is present.  Left Ear: External ear and ear canal normal. Tympanic membrane is not erythematous. A middle ear effusion is present.  Mouth/Throat: Oropharynx is clear and moist. No oropharyngeal exudate.  Mild TM bulging bilaterally.  Eyes: Conjunctivae and EOM are normal. Pupils are equal, round, and reactive to light.  Neck: Normal range of motion. Neck supple.  Tender diffusely  around cervical spine, radiating to the mastoids bilaterally.   Cardiovascular: Normal rate and regular rhythm.   No murmur heard. Cardiac defibrillator in place  Pulmonary/Chest: Effort normal and breath sounds normal. She has no wheezes.  Abdominal: Soft. Bowel sounds are normal. She exhibits no distension. There is no tenderness.  Musculoskeletal: She exhibits no edema.  Lymphadenopathy:    She has no cervical adenopathy.  Neurological: She is alert and oriented to person, place, and time.  Motor examination hindered due to pt history of Parkinson's  Skin: No rash noted.  BP (!) 133/46   Pulse 73   Temp 98.4 F (36.9 C) (Oral)   Wt 139 lb 9.6 oz (63.3 kg)   LMP 08/01/2000 (Approximate)   BMI 26.38 kg/m    Reviewed BMP from 9/14 (Cr 1.7)     Assessment:   [name] is a 65 year old female with a history of Parkinson's disease, HFrEF 20-25% s/p ICD, chronic back pain with radiculopathy, arthritis, acute bilateral ear effusion, in clinic for evaluation of headache.     Plan:    Cervical pain (neck) Acute visit for cervical neck pain/posterior headache. DDx. Included DDD vs related to bilateral ear effusion. No associated neurologic signs/symptoms.  -will give torodol shot today to help arthritis symptoms -continue to do the warm compresses in the area -can also try OTC ibuprofen PRN for relief   Acute effusion of both middle ears Bilateral serous ear effusion without acute infection. Likely contributing to patients symptoms of dizziness/lightheadedness.  -start Flonase to help drain fluid and relieve symptoms -Return to clinic if symptoms persist for more than a couple  of weeks sooner if you have a severe headache, develop high fever, or get any other concerning symptoms.

## 2016-04-28 NOTE — Patient Instructions (Addendum)
It was a pleasure seeing you in clinic today [name]! We discussed your recent headache and neck pain.  Headache -due to fluid collection behind the eardrums on both sides. This can cause pressure and dull pain. Can also affect balance and cause dizziness -Flonase, which is a nasal steroid that use can use 2 sprays in each nostril two times per day. Aim the spray away from the center (towards the roof of the nostrils). This should help drain the fluid from the ears  Neck Pain -will give torodol shot today to help arthritis symptoms. This medicine should not make you sleepy or drowsy -continue to do the warm compresses in the area  Return to clinic if symptoms persist for more than a week or call sooner if you have a severe headache, develop high fever, or get any other concerning symptoms.

## 2016-04-28 NOTE — Assessment & Plan Note (Signed)
Acute visit for cervical neck pain/posterior headache. DDx. Included DDD vs related to bilateral ear effusion. No associated neurologic signs/symptoms.  -will give torodol shot today to help arthritis symptoms -continue to do the warm compresses in the area -can also try OTC ibuprofen PRN for relief

## 2016-04-28 NOTE — Assessment & Plan Note (Signed)
Bilateral serous ear effusion without acute infection. Likely contributing to patients symptoms of dizziness/lightheadedness.  -start Flonase to help drain fluid and relieve symptoms -Return to clinic if symptoms persist for more than a couple of weeks sooner if you have a severe headache, develop high fever, or get any other concerning symptoms.

## 2016-04-29 ENCOUNTER — Telehealth: Payer: Self-pay | Admitting: Family Medicine

## 2016-04-29 NOTE — Telephone Encounter (Signed)
Medical student Veronica Shaw called to follow up with patient. Headache is improved after Toradol. Has not started Ibuprofen. No fevers. Flonase helping symptoms. Encouraged follow up if symptoms persist.

## 2016-05-04 ENCOUNTER — Ambulatory Visit (INDEPENDENT_AMBULATORY_CARE_PROVIDER_SITE_OTHER): Payer: Medicare Other | Admitting: Internal Medicine

## 2016-05-04 ENCOUNTER — Encounter: Payer: Self-pay | Admitting: Internal Medicine

## 2016-05-04 VITALS — BP 102/60 | HR 72 | Ht 61.0 in | Wt 139.0 lb

## 2016-05-04 DIAGNOSIS — Z95810 Presence of automatic (implantable) cardiac defibrillator: Secondary | ICD-10-CM | POA: Diagnosis not present

## 2016-05-04 DIAGNOSIS — I5022 Chronic systolic (congestive) heart failure: Secondary | ICD-10-CM

## 2016-05-04 LAB — CUP PACEART INCLINIC DEVICE CHECK
Battery Remaining Longevity: 111 mo
Battery Voltage: 3 V
Brady Statistic AP VP Percent: 0 %
Brady Statistic AP VS Percent: 0.55 %
Brady Statistic AS VP Percent: 0.04 %
Brady Statistic AS VS Percent: 99.41 %
Brady Statistic RA Percent Paced: 0.55 %
Brady Statistic RV Percent Paced: 0.04 %
Date Time Interrogation Session: 20171004133111
HighPow Impedance: 78 Ohm
Implantable Lead Implant Date: 20150427
Implantable Lead Implant Date: 20150427
Implantable Lead Location: 753859
Implantable Lead Location: 753860
Implantable Lead Model: 5076
Implantable Lead Model: 6935
Lead Channel Impedance Value: 399 Ohm
Lead Channel Impedance Value: 4047 Ohm
Lead Channel Impedance Value: 4047 Ohm
Lead Channel Impedance Value: 4047 Ohm
Lead Channel Impedance Value: 646 Ohm
Lead Channel Impedance Value: 722 Ohm
Lead Channel Pacing Threshold Amplitude: 0.5 V
Lead Channel Pacing Threshold Amplitude: 0.75 V
Lead Channel Pacing Threshold Pulse Width: 0.4 ms
Lead Channel Pacing Threshold Pulse Width: 0.4 ms
Lead Channel Sensing Intrinsic Amplitude: 1.5 mV
Lead Channel Sensing Intrinsic Amplitude: 18 mV
Lead Channel Setting Pacing Amplitude: 2 V
Lead Channel Setting Pacing Amplitude: 2.5 V
Lead Channel Setting Pacing Pulse Width: 0.4 ms
Lead Channel Setting Sensing Sensitivity: 0.3 mV

## 2016-05-04 MED ORDER — NITROGLYCERIN 0.4 MG SL SUBL: 0.4000 mg | SUBLINGUAL_TABLET | SUBLINGUAL | 3 refills | Status: DC | PRN

## 2016-05-04 NOTE — Progress Notes (Signed)
HPI Veronica Shaw returns today for followup. She is a pleasant 65 yo woman with a h/o an ICM, chronic systolic heart failure, and COPD/Asthma. She has had intermittant LBBB.  She has had no syncope or ICD shock. She has tried to increase her physical activity. 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 No edema. Allergies  Allergen Reactions  . Aspirin Swelling and Other (See Comments)    Causes nose bleeds *ONLY THE COATED ASA*  . Penicillins Shortness Of Breath and Rash    Shortness of Breath - Throat felt like it was closing.   [name] [Conj Estrog-Medroxyprogest Ace] Shortness Of Breath    Throat swelling  . Ace Inhibitors Cough  . Simvastatin Other (See Comments) and Rash    Muscle aches     Current Outpatient Prescriptions  Medication Sig Dispense Refill  . albuterol (PROVENTIL HFA;VENTOLIN HFA) 108 (90 Base) MCG/ACT inhaler Inhale 2 puffs into the lungs every 4 (four) hours as needed for wheezing or shortness of breath.    [name] Kitchen albuterol (PROVENTIL) (2.5 MG/3ML) 0.083% nebulizer solution 2.5 mg.    . cholestyramine (QUESTRAN) 4 GM/DOSE powder Take 4 g by mouth daily.     . fluticasone (FLONASE) 50 MCG/ACT nasal spray Place 2 sprays into both nostrils daily. 16 g 1  . furosemide (LASIX) 40 MG tablet Take 20 mg by mouth daily as needed for fluid (For swelling).     . gabapentin (NEURONTIN) 100 MG capsule Take 100 mg by mouth 3 times/day as needed-between meals & bedtime (pain).     . hydrocortisone 1 % ointment Apply 1 application topically 2 (two) times daily. 56 g 3  . levocetirizine (XYZAL) 5 MG tablet Take 5 mg by mouth daily as needed for allergies.    . metFORMIN (GLUCOPHAGE) 500 MG tablet TAKE TWO TABLETS BY MOUTH TWICE DAILY WITH A MEAL 360 tablet 3  . metoprolol succinate (TOPROL-XL) 50 MG 24 hr tablet Take 50 mg by mouth daily. Take with or immediately following a meal.    . mometasone-formoterol (DULERA) 100-5 MCG/ACT AERO Inhale 1 puff into the lungs  every morning.     . nitroGLYCERIN (NITROSTAT) 0.4 MG SL tablet Place 1 tablet (0.4 mg total) under the tongue every 5 (five) minutes as needed for chest pain. Take one tablet and place under tongue if develop chest pain may repeat x2 25 tablet 3  . omeprazole (PRILOSEC) 40 MG capsule Take 1 capsule (40 mg total) by mouth daily. 30 capsule 11  . potassium chloride (K-DUR,KLOR-CON) 10 MEQ tablet Take 1 tablet (10 mEq total) by mouth daily as needed (Take with lasix as needed for swelling). 30 tablet 6  . spironolactone (ALDACTONE) 25 MG tablet Take 1 tablet (25 mg total) by mouth every morning. 90 tablet 3  . triamcinolone (KENALOG) 0.025 % cream Apply 1 application topically 2 (two) times daily.    . valsartan (DIOVAN) 40 MG tablet Take 40 mg by mouth every morning.    [name] Kitchen EPINEPHrine (EPIPEN) 0.3 mg/0.3 mL SOAJ injection Inject 0.3 mg into the muscle as needed (allergic reaction). Reported on 12/22/2015     No current facility-administered medications for this visit.      Past Medical History:  Diagnosis Date  . Asthma   . Chronic systolic CHF (congestive heart failure) ([place])    a. cMRI 4/15: EF 34% and findings - c/w NICM, normal RV size and function (RVEF 61%), Mild MR // b. Echo  2/15:  EF 30-35%, diff HK, ant-sept AK, Gr 2 DD, mild MR, trivial TR  //  c. Echo 5/17: EF 20-25%, severe diffuse HK, marked systolic dyssynchrony, grade 1 diastolic dysfunction, mild MR  //  d. RHC 5/17: Fick CO 2.9, RVSP 19, PASP 15, PW mean 2, low filing pressures and preserved CO   . Diabetes mellitus   . Gastritis   . HTN (hypertension)   . Hyperlipidemia   . NICM (nonischemic cardiomyopathy) ([place])    a. Nuclear 5/13: Normal stress nuclear study. LV Ejection Fraction: 58%  //  b. LHC 10/14: Minor luminal irregularity in prox LAD, EF 35%   . Parkinson disease ([place])   . Plantar fasciitis   . Sleep apnea    was retested and no longer had it and so d/c CPAP  . Urticaria     ROS:   All systems reviewed and  negative except as noted in the HPI.   Past Surgical History:  Procedure Laterality Date  . BUNIONECTOMY    . CARDIAC CATHETERIZATION N/A 12/16/2015   Procedure: Right Heart Cath;  Surgeon: Sherren Mocha, MD;  Location: [place] CV [facility];  Service: Cardiovascular;  Laterality: N/A;  . CHOLECYSTECTOMY    . IMPLANTABLE CARDIOVERTER DEFIBRILLATOR IMPLANT  11-25-13   MDT dual chamber ICD implanted by [name] for primary prevention  . IMPLANTABLE CARDIOVERTER DEFIBRILLATOR IMPLANT N/A 11/25/2013   Procedure: IMPLANTABLE CARDIOVERTER DEFIBRILLATOR IMPLANT;  Surgeon: [name], MD;  Location: [facility] CATH LAB;  Service: Cardiovascular;  Laterality: N/A;  . LEFT AND RIGHT HEART CATHETERIZATION WITH CORONARY ANGIOGRAM N/A 05/31/2013   Procedure: LEFT AND RIGHT HEART CATHETERIZATION WITH CORONARY ANGIOGRAM;  Surgeon: [name], MD;  Location: [facility] C[facility] LAB;  Service: Cardiovascular;  Laterality: N/A;  . TONSILLECTOMY    . TUBAL LIGATION       Family History  Problem Relation Age of Onset  . Coronary artery disease Father     Died age 34  . Heart attack Father   . Diabetes Father   . Coronary artery disease Mother     Died age 25  . Heart attack Mother   . Diabetes Mother   . Stroke Mother   . Parkinson's disease Sister   . Heart disease Sister   . Hepatitis C Sister   . Diabetes Son 37    T1DM  . Diabetes Sister   . Heart disease Sister   . Diabetes Sister   . Heart disease Sister      Social History   Social History  . Marital status: Married    Spouse name: N/A  . Number of children: 2  . Years of education: N/A   Occupational History  . retired     Quarry manager   Social History Main Topics  . Smoking status: Never Smoker  . Smokeless tobacco: Never Used     Comment: exposed to smoke during child hood (parents)  . Alcohol use No  . Drug use: No  . Sexual activity: Yes    Birth control/ protection: Surgical, Post-menopausal   Other Topics Concern  . Not on file    Social History Narrative   Lives at home with husband and the dog.     BP 102/60   Pulse 72   Ht 5\' 1"  (1.549 m)   Wt 139 lb (63 kg)   LMP 08/01/2000 (Approximate)   BMI 26.26 kg/m   Physical Exam:  Well appearing 65 yo woman, NAD HEENT: Unremarkable Neck:  6 cm JVD, no thyromegally Back:  No CVA tenderness Lungs:  Clear with no wheezes HEART:  Regular rate rhythm, no murmurs, no rubs, no clicks Abd:  soft, positive bowel sounds, no organomegally, no rebound, no guarding Ext:  2 plus pulses, no edema, no cyanosis, no clubbing Skin:  No rashes no nodules Neuro:  CN II through XII intact, motor grossly intact  DEVICE  Normal device function.  See PaceArt for details.   Assess/Plan: 1. Chronic systolic heart failure - her symptoms are class 2B. She is encouraged to remain on a low sodium diet and increase her activity. 2. ICD - her Medtronic DDD ICD is working normally. Will plan to enroll her in our ICM clinic. 3. HTN - her blood pressure is will controlled. Will follow.  Mikle Bosworth.D.

## 2016-05-04 NOTE — Patient Instructions (Signed)
Medication Instructions:  Your physician recommends that you continue on your current medications as directed. Please refer to the Current Medication list given to you today.   Labwork: None Ordered   Testing/Procedures: None Ordered   Follow-Up: Your physician wants you to follow-up in: 1 year with Dr. Lovena Le. You will receive a reminder letter in the mail two months in advance. If you don't receive a letter, please call our office to schedule the follow-up appointment.   Remote monitoring is used to monitor your ICD from home. This monitoring reduces the number of office visits required to check your device to one time per year. It allows [place] to keep an eye on the functioning of your device to ensure it is working properly. You are scheduled for a device check from home on 08/03/16. You may send your transmission at any time that day. If you have a wireless device, the transmission will be sent automatically. After your physician reviews your transmission, you will receive a postcard with your next transmission date.    Any Other Special Instructions Will Be Listed Below (If Applicable). Cecille Rubin will call you to check on fluid levels.    If you need a refill on your cardiac medications before your next appointment, please call your pharmacy.

## 2016-05-06 ENCOUNTER — Telehealth: Payer: Self-pay

## 2016-05-06 NOTE — Telephone Encounter (Signed)
Referred to ICM by Levander Campion, device RN and [name].  ICM intro letter given at office visit on 05/04/2016.  Call to patient today and she agreed to [facility] monthly calls.  1st ICM remote transmission scheduled for 06/06/2016.  Provided ICM phone number and advised transmission should be automatic during the night.  Encouraged to call for any fluid symptoms.

## 2016-05-17 ENCOUNTER — Other Ambulatory Visit: Payer: Self-pay | Admitting: Cardiovascular Disease

## 2016-06-06 ENCOUNTER — Ambulatory Visit (INDEPENDENT_AMBULATORY_CARE_PROVIDER_SITE_OTHER): Payer: Medicare Other

## 2016-06-06 DIAGNOSIS — Z95810 Presence of automatic (implantable) cardiac defibrillator: Secondary | ICD-10-CM | POA: Diagnosis not present

## 2016-06-06 DIAGNOSIS — I5022 Chronic systolic (congestive) heart failure: Secondary | ICD-10-CM

## 2016-06-06 NOTE — Progress Notes (Signed)
EPIC Encounter for ICM Monitoring  Patient Name: Veronica Shaw is a 66 y.o. female Date: 06/06/2016 Primary Care Physican: Darci Needle, MD Primary Cardiologist: Burt Knack Electrophysiologist: Lovena Le Dry Weight: 142 lbs       1st ICM encounter  Heart Failure questions reviewed, pt asymptomatic for fluid.   She complains of lightheadedness.  Advised her to make an appointment with either PCP or she is due for a 6 month follow up with Dr Burt Knack.   Thoracic impedance normal   Recommendations: No changes.  Advised to limit salt intake to 2000 mg daily.  Encouraged to call for fluid symptoms.    Follow-up plan: ICM clinic phone appointment on 07/07/2016.  Copy of ICM check sent to device physician.   ICM trend: 06/06/2016       [name], RN 06/06/2016 8:48 AM

## 2016-06-17 NOTE — Progress Notes (Deleted)
Veronica Shaw was seen today in the movement disorders clinic for neurologic consultation at the request of Darci Needle, MD.  The patient has previously been following with Dr. Kyra Searles and her physician assistant, although she has not been seen at [facility] since March, 2015 and has not been seen by Dr. Hall Busing since October, 2014.  I have reviewed [facility] records.  She was first seen by [facility] in August, 2014 and was diagnosed with probable Parkinson's disease and was started on levodopa at that visit.  Her complaints at that time were pain all over, loss of balance, and right hand tremor.  When she followed up in October, 2014 she was only taking the levodopa twice a day, but UPDRS motor score had greatly improved.  She was told to increase the carbidopa/levodopa 25/100-1-1/2 tablets 3 times a day, but when she followed up in March, 2015 she did not think that that made a significant difference.  She was told to continue the medication.  As above, she has not been seen back by the neurology clinic at [facility] since that time.  She is off of carbidopa/levodopa as she doesn't think that she has parkinsons disease.  She states that she has been off of the medication for the last 2 years.  She does state that she often goes to the right side and will lose balance    12/22/15 update:  The patient follows up today regarding Parkinson's disease.  She was started on pramipexole 0.5 mg and supposed to work up to tid.  She admits that she didn't take it.  Her sister told her not to.  She states that she "told me before that she tried it" but I reminded her that she tried levodopa but only took it bid and then she d/c it.  She was worried about dyskinesia.  She states that she went to a prayer meeting and thinks that she may be cured.  She admits that she occasionally uses a cane because she goes to the right.    She denies any compulsive behaviors.  Denies sleep attacks.  No falls.  No hallucinations.  No  lightheadedness or near syncope.  I did review records since last visit.  She underwent a right heart cath on 12/16/2015.  There was low filling pressures, but normal cardiac output.  Dr. Burt Knack suspected her shortness of breath secondary to asthma.  She states that has limited her exercise activities.    06/17/16 update:  The patient follows up today regarding Parkinson's disease.  She refuses any medication for her disease.  She denies falls, hallucinations, lightheadedness or near syncope.  I have reviewed her records since our last visit.  No new cardiac issues, although she continues to follow with cardiology.  She does have an ICD.  PREVIOUS MEDICATIONS: Sinemet (pt was only taking bid and didn't think helped)  ALLERGIES:   Allergies  Allergen Reactions  . Aspirin Swelling and Other (See Comments)    Causes nose bleeds *ONLY THE COATED ASA*  . Penicillins Shortness Of Breath and Rash    Shortness of Breath - Throat felt like it was closing.   [name] [Conj Estrog-Medroxyprogest Ace] Shortness Of Breath    Throat swelling  . Ace Inhibitors Cough  . Simvastatin Other (See Comments) and Rash    Muscle aches    CURRENT MEDICATIONS:  Outpatient Encounter Prescriptions as of 06/20/2016  Medication Sig  . albuterol (PROVENTIL HFA;VENTOLIN HFA) 108 (90 Base) MCG/ACT inhaler  Inhale 2 puffs into the lungs every 4 (four) hours as needed for wheezing or shortness of breath.  [name] Kitchen albuterol (PROVENTIL) (2.5 MG/3ML) 0.083% nebulizer solution 2.5 mg.  . cholestyramine (QUESTRAN) 4 GM/DOSE powder Take 4 g by mouth daily.   [name] Kitchen EPINEPHrine (EPIPEN) 0.3 mg/0.3 mL SOAJ injection Inject 0.3 mg into the muscle as needed (allergic reaction). Reported on 12/22/2015  . fluticasone (FLONASE) 50 MCG/ACT nasal spray Place 2 sprays into both nostrils daily.  . furosemide (LASIX) 40 MG tablet Take 20 mg by mouth daily as needed for fluid (For swelling).   . gabapentin (NEURONTIN) 100 MG capsule Take 100 mg by  mouth 3 times/day as needed-between meals & bedtime (pain).   . hydrocortisone 1 % ointment Apply 1 application topically 2 (two) times daily.  [name] Kitchen levocetirizine (XYZAL) 5 MG tablet Take 5 mg by mouth daily as needed for allergies.  . metFORMIN (GLUCOPHAGE) 500 MG tablet TAKE TWO TABLETS BY MOUTH TWICE DAILY WITH A MEAL  . metoprolol succinate (TOPROL-XL) 50 MG 24 hr tablet Take 50 mg by mouth daily. Take with or immediately following a meal.  . mometasone-formoterol (DULERA) 100-5 MCG/ACT AERO Inhale 1 puff into the lungs every morning.   . nitroGLYCERIN (NITROSTAT) 0.4 MG SL tablet Place 1 tablet (0.4 mg total) under the tongue every 5 (five) minutes as needed for chest pain (MAX 3 tablets).  [name] Kitchen omeprazole (PRILOSEC) 40 MG capsule Take 1 capsule (40 mg total) by mouth daily.  . potassium chloride (K-DUR,KLOR-CON) 10 MEQ tablet Take 1 tablet (10 mEq total) by mouth daily as needed (Take with lasix as needed for swelling).  [name] Kitchen spironolactone (ALDACTONE) 25 MG tablet Take 1 tablet (25 mg total) by mouth every morning.  . triamcinolone (KENALOG) 0.025 % cream Apply 1 application topically 2 (two) times daily.  . valsartan (DIOVAN) 40 MG tablet Take 1 tablet (40 mg total) by mouth daily. Please call and schedule an appointment with Dr Burt Knack   No facility-administered encounter medications on file as of 06/20/2016.     PAST MEDICAL HISTORY:   Past Medical History:  Diagnosis Date  . Asthma   . Chronic systolic CHF (congestive heart failure) ([place])    a. cMRI 4/15: EF 34% and findings - c/w NICM, normal RV size and function (RVEF 61%), Mild MR // b. Echo 2/15:  EF 30-35%, diff HK, ant-sept AK, Gr 2 DD, mild MR, trivial TR  //  c. Echo 5/17: EF 20-25%, severe diffuse HK, marked systolic dyssynchrony, grade 1 diastolic dysfunction, mild MR  //  d. RHC 5/17: Fick CO 2.9, RVSP 19, PASP 15, PW mean 2, low filing pressures and preserved CO   . Diabetes mellitus   . Gastritis   . HTN (hypertension)   .  Hyperlipidemia   . NICM (nonischemic cardiomyopathy) ([place])    a. Nuclear 5/13: Normal stress nuclear study. LV Ejection Fraction: 58%  //  b. LHC 10/14: Minor luminal irregularity in prox LAD, EF 35%   . Parkinson disease ([place])   . Plantar fasciitis   . Sleep apnea    was retested and no longer had it and so d/c CPAP  . Urticaria     PAST SURGICAL HISTORY:   Past Surgical History:  Procedure Laterality Date  . BUNIONECTOMY    . CARDIAC CATHETERIZATION N/A 12/16/2015   Procedure: Right Heart Cath;  Surgeon: Sherren Mocha, MD;  Location: [place] [facility];  Service: Cardiovascular;  Laterality: N/A;  . CHOLECYSTECTOMY    .  IMPLANTABLE CARDIOVERTER DEFIBRILLATOR IMPLANT  11-25-13   MDT dual chamber ICD implanted by [name] for primary prevention  . IMPLANTABLE CARDIOVERTER DEFIBRILLATOR IMPLANT N/A 11/25/2013   Procedure: IMPLANTABLE CARDIOVERTER DEFIBRILLATOR IMPLANT;  Surgeon: [name], MD;  Location: [facility] CATH LAB;  Service: Cardiovascular;  Laterality: N/A;  . LEFT AND RIGHT HEART CATHETERIZATION WITH CORONARY ANGIOGRAM N/A 05/31/2013   Procedure: LEFT AND RIGHT HEART CATHETERIZATION WITH CORONARY ANGIOGRAM;  Surgeon: [name], MD;  Location: [facility] CATH LAB;  Service: Cardiovascular;  Laterality: N/A;  . TONSILLECTOMY    . TUBAL LIGATION      SOCIAL HISTORY:   Social History   Social History  . Marital status: Married    Spouse name: N/A  . Number of children: 2  . Years of education: N/A   Occupational History  . retired     Quarry manager   Social History Main Topics  . Smoking status: Never Smoker  . Smokeless tobacco: Never Used     Comment: exposed to smoke during child hood (parents)  . Alcohol use No  . Drug use: No  . Sexual activity: Yes    Birth control/ protection: Surgical, Post-menopausal   Other Topics Concern  . Not on file   Social History Narrative   Lives at home with husband and the dog.    FAMILY HISTORY:   Family Status  Relation Status    . Father Deceased   MI, heart disease  . Mother Deceased   MI, stroke, DM  . Maternal Grandmother Deceased  . Maternal Grandfather Deceased  . Paternal Grandmother Deceased  . Paternal Grandfather Deceased  . Sister Alive    heart disease x3  . Son Alive   DM  . Daughter Alive   healthy  . Sister Alive  . Sister Alive    ROS:  Gets "right sided sciatic nerve pain" and takes gabapentin.  Has chronic SOB with asthma and GERD.   A complete 10 system review of systems was obtained and was unremarkable apart from what is mentioned above.  PHYSICAL EXAMINATION:    VITALS:   There were no vitals filed for this visit.  GEN:  The patient appears stated age and is in NAD. HEENT:  Normocephalic, atraumatic.  The mucous membranes are moist. The superficial temporal arteries are without ropiness or tenderness. CV:  RRR Lungs:  CTAB Neck/HEME:  There are no carotid bruits bilaterally.  Neurological examination:  Orientation:  Montreal Cognitive Assessment  10/19/2015  Visuospatial/ Executive (0/5) 4  Naming (0/3) 3  Attention: Read list of digits (0/2) 2  Attention: Read list of letters (0/1) 1  Attention: Serial 7 subtraction starting at 100 (0/3) 0  Language: Repeat phrase (0/2) 2  Language : Fluency (0/1) 0  Abstraction (0/2) 2  Delayed Recall (0/5) 0  Orientation (0/6) 6  Total 20  Adjusted Score (based on education) 21   Cranial nerves: There is good facial symmetry. Pupils are equal round and reactive to light bilaterally. Fundoscopic exam reveals clear margins bilaterally. Extraocular muscles are intact. The visual fields are full to confrontational testing. The speech is fluent and clear. Soft palate rises symmetrically and there is no tongue deviation. Hearing is intact to conversational tone. Sensation: Sensation is intact to light and pinprick throughout (facial, trunk, extremities). Vibration is intact at the bilateral big toe. There is no extinction with double  simultaneous stimulation. There is no sensory dermatomal level identified. Motor: Strength is 5/5 in the bilateral upper and  lower extremities.   Shoulder shrug is equal and symmetric.  There is no pronator drift.   Movement examination: Tone: There is mild increased tone in the RUE.  Tone elsewhere is normal.   Abnormal movements: There is a rare, mild intermittent RUE resting tremor that is only present with distraction Coordination:  There is decremation with RAM's, seen with finger taps bilaterally and hand opening and closing bilaterally. Gait and Station: The patient has mild difficulty arising out of a deep-seated chair without the use of the hands but she is able to do this. The patient's stride length is normal with fairly good arm swing.    ASSESSMENT/PLAN:  1.  Idiopathic Parkinson's disease.  The patient has tremor, bradykinesia, rigidity and mild postural instability.  Dx was made in 03/2013 at [facility].    -given mirapex but refused to take.  Understands consequences of this decision.    -has been on levodopa in past when at [facility] but only took bid and didn't think helpful and d/c.  Is off now.  -talked about importance of exercising.   2.  Lumbar spinal stenosis  -She thought that she didn't have PD but that this was her only problem.  This certainly could be contributing but isn't her only issue.  She take a small dose of gabapentin at bedtime (100mg  prn) and it helps. 3.  Will see her in 6 months.  Much greater than 50% of this visit was spent in counseling and coordinating care.  Total face to face time:  25 min

## 2016-06-20 ENCOUNTER — Other Ambulatory Visit: Payer: Self-pay | Admitting: Family Medicine

## 2016-06-20 ENCOUNTER — Ambulatory Visit: Payer: Medicare Other | Admitting: Neurology

## 2016-06-20 ENCOUNTER — Other Ambulatory Visit: Payer: Self-pay | Admitting: Neurology

## 2016-06-20 ENCOUNTER — Other Ambulatory Visit: Payer: Self-pay | Admitting: Physician Assistant

## 2016-06-29 NOTE — Progress Notes (Signed)
Veronica Shaw was seen today in the movement disorders clinic for neurologic consultation at the request of Darci Needle, MD.  The patient has previously been following with Dr. Kyra Searles and her physician assistant, although she has not been seen at [facility] since March, 2015 and has not been seen by Dr. Hall Busing since October, 2014.  I have reviewed [facility] records.  She was first seen by [facility] in August, 2014 and was diagnosed with probable Parkinson's disease and was started on levodopa at that visit.  Her complaints at that time were pain all over, loss of balance, and right hand tremor.  When she followed up in October, 2014 she was only taking the levodopa twice a day, but UPDRS motor score had greatly improved.  She was told to increase the carbidopa/levodopa 25/100-1-1/2 tablets 3 times a day, but when she followed up in March, 2015 she did not think that that made a significant difference.  She was told to continue the medication.  As above, she has not been seen back by the neurology clinic at [facility] since that time.  She is off of carbidopa/levodopa as she doesn't think that she has parkinsons disease.  She states that she has been off of the medication for the last 2 years.  She does state that she often goes to the right side and will lose balance    12/22/15 update:  The patient follows up today regarding Parkinson's disease.  She was started on pramipexole 0.5 mg and supposed to work up to tid.  She admits that she didn't take it.  Her sister told her not to.  She states that she "told me before that she tried it" but I reminded her that she tried levodopa but only took it bid and then she d/c it.  She was worried about dyskinesia.  She states that she went to a prayer meeting and thinks that she may be cured.  She admits that she occasionally uses a cane because she goes to the right.    She denies any compulsive behaviors.  Denies sleep attacks.  No falls.  No hallucinations.  No  lightheadedness or near syncope.  I did review records since last visit.  She underwent a right heart cath on 12/16/2015.  There was low filling pressures, but normal cardiac output.  Dr. Burt Knack suspected her shortness of breath secondary to asthma.  She states that has limited her exercise activities.    07/01/16 update:  The patient follows up today regarding Parkinson's disease.  She refuses any medication for her disease.  Reports that she has had a few falls but "I have not had any broken bones."  She reports that she takes coconut oil now as she heard it was good for her on the news.  No hallucinations, lightheadedness or near syncope.  I have reviewed her records since our last visit.  No new cardiac issues, although she continues to follow with cardiology.  She does have an ICD.  She is having some headaches in the temporal region 2 times per week and last one hour.  It feels throbbing.  Bilateral temporal.  Go away on own without med.   No longer taking neurontin.  She is no longer going to gym but is exercising some at home.  Gym exercise made her asthma worse.  She is now raising her 67 year old grandson.    PREVIOUS MEDICATIONS: Sinemet (pt was only taking bid and didn't think helped)  ALLERGIES:   Allergies  Allergen Reactions  . Aspirin Swelling and Other (See Comments)    Causes nose bleeds *ONLY THE COATED ASA*  . Penicillins Shortness Of Breath and Rash    Shortness of Breath - Throat felt like it was closing.   [name] [Conj Estrog-Medroxyprogest Ace] Shortness Of Breath    Throat swelling  . Ace Inhibitors Cough  . Simvastatin Other (See Comments) and Rash    Muscle aches    CURRENT MEDICATIONS:  Outpatient Encounter Prescriptions as of 07/01/2016  Medication Sig  . albuterol (PROVENTIL HFA;VENTOLIN HFA) 108 (90 Base) MCG/ACT inhaler Inhale 2 puffs into the lungs every 4 (four) hours as needed for wheezing or shortness of breath.  [name] Kitchen albuterol (PROVENTIL) (2.5 MG/3ML) 0.083%  nebulizer solution 2.5 mg.  . cholestyramine (QUESTRAN) 4 GM/DOSE powder Take 4 g by mouth daily.   [name] Kitchen EPINEPHrine (EPIPEN) 0.3 mg/0.3 mL SOAJ injection Inject 0.3 mg into the muscle as needed (allergic reaction). Reported on 12/22/2015  . fluticasone (FLONASE) 50 MCG/ACT nasal spray Place 2 sprays into both nostrils daily.  . furosemide (LASIX) 40 MG tablet Take 20 mg by mouth daily as needed for fluid (For swelling).   . gabapentin (NEURONTIN) 100 MG capsule Take 100 mg by mouth 3 times/day as needed-between meals & bedtime (pain).   . metFORMIN (GLUCOPHAGE) 500 MG tablet TAKE TWO TABLETS BY MOUTH TWICE DAILY WITH A MEAL  . metoprolol succinate (TOPROL-XL) 50 MG 24 hr tablet TAKE 1 AND 1/2 TABLETS DAILY FOR 1 WEEK, THEN INCREASE TO 2 TABLETS DAILY AS DIRECTED (DOSE CHANGE)  . mometasone-formoterol (DULERA) 100-5 MCG/ACT AERO Inhale 1 puff into the lungs every morning.   [name] Kitchen omeprazole (PRILOSEC) 40 MG capsule Take 1 capsule (40 mg total) by mouth daily.  . potassium chloride (K-DUR,KLOR-CON) 10 MEQ tablet Take 1 tablet (10 mEq total) by mouth daily as needed (Take with lasix as needed for swelling).  [name] Kitchen spironolactone (ALDACTONE) 25 MG tablet Take 1 tablet (25 mg total) by mouth every morning.  . triamcinolone (KENALOG) 0.025 % cream Apply 1 application topically 2 (two) times daily.  . valsartan (DIOVAN) 40 MG tablet Take 1 tablet (40 mg total) by mouth daily. Please call and schedule an appointment with Dr Burt Knack  . nitroGLYCERIN (NITROSTAT) 0.4 MG SL tablet Place 1 tablet (0.4 mg total) under the tongue every 5 (five) minutes as needed for chest pain (MAX 3 tablets). (Patient not taking: Reported on 07/01/2016)  . [DISCONTINUED] hydrocortisone 1 % ointment Apply 1 application topically 2 (two) times daily.  . [DISCONTINUED] levocetirizine (XYZAL) 5 MG tablet Take 5 mg by mouth daily as needed for allergies.   No facility-administered encounter medications on file as of 07/01/2016.     PAST MEDICAL  HISTORY:   Past Medical History:  Diagnosis Date  . Asthma   . Chronic systolic CHF (congestive heart failure) ([place])    a. cMRI 4/15: EF 34% and findings - c/w NICM, normal RV size and function (RVEF 61%), Mild MR // b. Echo 2/15:  EF 30-35%, diff HK, ant-sept AK, Gr 2 DD, mild MR, trivial TR  //  c. Echo 5/17: EF 20-25%, severe diffuse HK, marked systolic dyssynchrony, grade 1 diastolic dysfunction, mild MR  //  d. RHC 5/17: Fick CO 2.9, RVSP 19, PASP 15, PW mean 2, low filing pressures and preserved CO   . Diabetes mellitus   . Gastritis   . HTN (hypertension)   . Hyperlipidemia   . NICM (  nonischemic cardiomyopathy) ([place])    a. Nuclear 5/13: Normal stress nuclear study. LV Ejection Fraction: 58%  //  b. LHC 10/14: Minor luminal irregularity in prox LAD, EF 35%   . Parkinson disease ([place])   . Plantar fasciitis   . Sleep apnea    was retested and no longer had it and so d/c CPAP  . Urticaria     PAST SURGICAL HISTORY:   Past Surgical History:  Procedure Laterality Date  . BUNIONECTOMY    . CARDIAC CATHETERIZATION N/A 12/16/2015   Procedure: Right Heart Cath;  Surgeon: Sherren Mocha, MD;  Location: [place] CV LAB;  Service: Cardiovascular;  Laterality: N/A;  . CHOLECYSTECTOMY    . IMPLANTABLE CARDIOVERTER DEFIBRILLATOR IMPLANT  11-25-13   MDT dual chamber ICD implanted by [name] for primary prevention  . IMPLANTABLE CARDIOVERTER DEFIBRILLATOR IMPLANT N/A 11/25/2013   Procedure: IMPLANTABLE CARDIOVERTER DEFIBRILLATOR IMPLANT;  Surgeon: [name], MD;  Location: [facility] CATH LAB;  Service: Cardiovascular;  Laterality: N/A;  . LEFT AND RIGHT HEART CATHETERIZATION WITH CORONARY ANGIOGRAM N/A 05/31/2013   Procedure: LEFT AND RIGHT HEART CATHETERIZATION WITH CORONARY ANGIOGRAM;  Surgeon: [name], MD;  Location: [facility] CATH LAB;  Service: Cardiovascular;  Laterality: N/A;  . TONSILLECTOMY    . TUBAL LIGATION      SOCIAL HISTORY:   Social History   Social History  . Marital  status: Married    Spouse name: N/A  . Number of children: 2  . Years of education: N/A   Occupational History  . retired     Quarry manager   Social History Main Topics  . Smoking status: Never Smoker  . Smokeless tobacco: Never Used     Comment: exposed to smoke during child hood (parents)  . Alcohol use No  . Drug use: No  . Sexual activity: Yes    Birth control/ protection: Surgical, Post-menopausal   Other Topics Concern  . Not on file   Social History Narrative   Lives at home with husband and the dog.    FAMILY HISTORY:   Family Status  Relation Status  . Father Deceased   MI, heart disease  . Mother Deceased   MI, stroke, DM  . Maternal Grandmother Deceased  . Maternal Grandfather Deceased  . Paternal Grandmother Deceased  . Paternal Grandfather Deceased  . Sister Alive    heart disease x3  . Son Alive   DM  . Daughter Alive   healthy  . Sister Alive  . Sister Alive    ROS:   Has chronic SOB with asthma and GERD.   A complete 10 system review of systems was obtained and was unremarkable apart from what is mentioned above.  PHYSICAL EXAMINATION:    VITALS:   Vitals:   07/01/16 0849  BP: 116/60  Pulse: 69  Weight: 141 lb (64 kg)  Height: 5\' 2"  (1.575 m)    GEN:  The patient appears stated age and is in NAD. HEENT:  Normocephalic, atraumatic.  The mucous membranes are moist. The superficial temporal arteries are without ropiness or tenderness. CV:  RRR Lungs:  CTAB Neck/HEME:  There are no carotid bruits bilaterally.  Neurological examination:  Orientation:  Montreal Cognitive Assessment  10/19/2015  Visuospatial/ Executive (0/5) 4  Naming (0/3) 3  Attention: Read list of digits (0/2) 2  Attention: Read list of letters (0/1) 1  Attention: Serial 7 subtraction starting at 100 (0/3) 0  Language: Repeat phrase (0/2) 2  Language : Fluency (  0/1) 0  Abstraction (0/2) 2  Delayed Recall (0/5) 0  Orientation (0/6) 6  Total 20  Adjusted Score (based on  education) 21   Cranial nerves: There is good facial symmetry.  The visual fields are full to confrontational testing. The speech is fluent and clear. Soft palate rises symmetrically and there is no tongue deviation. Hearing is intact to conversational tone. Sensation: Sensation is intact to light and pinprick throughout (facial, trunk, extremities). Vibration is intact at the bilateral big toe. There is no extinction with double simultaneous stimulation. There is no sensory dermatomal level identified. Motor: Strength is 5/5 in the bilateral upper and lower extremities.   Shoulder shrug is equal and symmetric.  There is no pronator drift.   Movement examination: Tone: There is mild to moderate increased tone in the RUE and mild increase in the LUE.   Tone in the RLE is mildly increased.   Abnormal movements: There is no tremor Coordination:  There is good RAMs today Gait and Station: The patient has mild difficulty arising out of a deep-seated chair without the use of the hands but she is able to do this. The patient's stride length is normal with fairly good arm swing.    ASSESSMENT/PLAN:  1.  Idiopathic Parkinson's disease.  The patient has tremor, bradykinesia, rigidity and mild postural instability.  Dx was made in 03/2013 at [facility].    -given mirapex and talked to her again about this especially given the fact that she has had several falls but she refuses.  Understands consequences of this decision.    -has been on levodopa in past when at [facility] but only took bid and didn't think helpful and d/c.  Is off now.  -talked about importance of exercising.   2.  Lumbar spinal stenosis  -She thought that she didn't have PD but that this was her only problem.  This certainly could be contributing but isn't her only issue.  She has d/c her gabapentin 3.  Will see her in 6 months.  Much greater than 50% of this visit was spent in counseling and coordinating care.  Total face to face time:  25  min

## 2016-07-01 ENCOUNTER — Encounter: Payer: Self-pay | Admitting: Neurology

## 2016-07-01 ENCOUNTER — Ambulatory Visit (INDEPENDENT_AMBULATORY_CARE_PROVIDER_SITE_OTHER): Payer: Medicare Other | Admitting: Neurology

## 2016-07-01 VITALS — BP 116/60 | HR 69 | Ht 62.0 in | Wt 141.0 lb

## 2016-07-01 DIAGNOSIS — G20 Parkinson's disease: Secondary | ICD-10-CM

## 2016-07-04 ENCOUNTER — Other Ambulatory Visit: Payer: Self-pay | Admitting: *Deleted

## 2016-07-05 NOTE — Telephone Encounter (Signed)
2nd request.  Martin, Tamika L, RN  

## 2016-07-06 ENCOUNTER — Other Ambulatory Visit: Payer: Self-pay | Admitting: Internal Medicine

## 2016-07-06 ENCOUNTER — Other Ambulatory Visit: Payer: Self-pay | Admitting: Cardiovascular Disease

## 2016-07-06 MED ORDER — POTASSIUM CHLORIDE CRYS ER 10 MEQ PO TBCR: 10.0000 meq | EXTENDED_RELEASE_TABLET | Freq: Every day | ORAL | 6 refills | Status: DC | PRN

## 2016-07-07 ENCOUNTER — Other Ambulatory Visit: Payer: Self-pay

## 2016-07-07 ENCOUNTER — Ambulatory Visit (INDEPENDENT_AMBULATORY_CARE_PROVIDER_SITE_OTHER): Payer: Medicare Other

## 2016-07-07 ENCOUNTER — Telehealth: Payer: Self-pay

## 2016-07-07 ENCOUNTER — Telehealth: Payer: Self-pay | Admitting: Internal Medicine

## 2016-07-07 DIAGNOSIS — I5022 Chronic systolic (congestive) heart failure: Secondary | ICD-10-CM

## 2016-07-07 DIAGNOSIS — Z95810 Presence of automatic (implantable) cardiac defibrillator: Secondary | ICD-10-CM

## 2016-07-07 MED ORDER — MOMETASONE FURO-FORMOTEROL FUM 100-5 MCG/ACT IN AERO: 1.0000 | INHALATION_SPRAY | RESPIRATORY_TRACT | 0 refills | Status: DC

## 2016-07-07 NOTE — Telephone Encounter (Signed)
Fine with me

## 2016-07-07 NOTE — Telephone Encounter (Signed)
MW who do you recommend patient see?

## 2016-07-07 NOTE — Telephone Encounter (Signed)
JN please advise if you are ok to take on seeing this pt.  She wanted to change from MW to someone else in the office.  Please advise. thanks

## 2016-07-07 NOTE — Progress Notes (Signed)
EPIC Encounter for ICM Monitoring  Patient Name: Veronica Shaw is a 65 y.o. female Date: 07/07/2016 Primary Care Physican: Darci Needle, MD Primary Cardiologist: Burt Knack Electrophysiologist: Lovena Le Dry Weight:  unknown        Heart Failure questions reviewed, pt asymptomatic   Thoracic impedance normal   Recommendations: No changes.  Reinforced to limit low salt food choices to 2000 mg day and limiting fluid intake to < 2 liters per day. Encouraged to call for fluid symptoms.    Follow-up plan: ICM clinic phone appointment on 08/09/2015.    Copy of ICM check sent to device physician.    ICM trend: 07/07/2016       [name], RN 07/07/2016 2:29 PM

## 2016-07-07 NOTE — Telephone Encounter (Signed)
Spoke with pt. And informed her that she needs to get an appointment for follow up and she would like further refills. 2 inhalers sent in. Another phone message was placed to MW I regarding future appointments.

## 2016-07-07 NOTE — Telephone Encounter (Signed)
MW  Please Advise  Pt. Is requesting to change providers. Are you ok with this and who would you recommend.

## 2016-07-07 NOTE — Telephone Encounter (Signed)
First avail for new pt

## 2016-07-08 NOTE — Telephone Encounter (Signed)
Fine by me 

## 2016-07-08 NOTE — Telephone Encounter (Signed)
Pt scheduled with JN on 08-11-16 @ 9am Pt aware & voiced understanding. Nothing further needed.

## 2016-07-14 ENCOUNTER — Other Ambulatory Visit: Payer: Self-pay | Admitting: Internal Medicine

## 2016-07-21 ENCOUNTER — Ambulatory Visit (INDEPENDENT_AMBULATORY_CARE_PROVIDER_SITE_OTHER): Payer: Medicare Other | Admitting: Internal Medicine

## 2016-07-21 ENCOUNTER — Encounter: Payer: Self-pay | Admitting: Internal Medicine

## 2016-07-21 ENCOUNTER — Telehealth: Payer: Self-pay | Admitting: Internal Medicine

## 2016-07-21 VITALS — BP 124/70 | HR 77 | Temp 98.1°F | Ht 62.0 in | Wt 142.0 lb

## 2016-07-21 DIAGNOSIS — E118 Type 2 diabetes mellitus with unspecified complications: Secondary | ICD-10-CM | POA: Diagnosis not present

## 2016-07-21 DIAGNOSIS — E119 Type 2 diabetes mellitus without complications: Secondary | ICD-10-CM | POA: Diagnosis not present

## 2016-07-21 DIAGNOSIS — J4530 Mild persistent asthma, uncomplicated: Secondary | ICD-10-CM

## 2016-07-21 DIAGNOSIS — L659 Nonscarring hair loss, unspecified: Secondary | ICD-10-CM

## 2016-07-21 DIAGNOSIS — J4520 Mild intermittent asthma, uncomplicated: Secondary | ICD-10-CM | POA: Diagnosis not present

## 2016-07-21 LAB — COMPLETE METABOLIC PANEL WITH GFR
ALT: 12 U/L (ref 6–29)
AST: 21 U/L (ref 10–35)
Albumin: 3.9 g/dL (ref 3.6–5.1)
Alkaline Phosphatase: 83 U/L (ref 33–130)
BUN: 17 mg/dL (ref 7–25)
CO2: 27 mmol/L (ref 20–31)
Calcium: 9.5 mg/dL (ref 8.6–10.4)
Chloride: 105 mmol/L (ref 98–110)
Creat: 1.18 mg/dL — ABNORMAL HIGH (ref 0.50–0.99)
GFR, Est African American: 55 mL/min — ABNORMAL LOW (ref >=60)
GFR, Est Non African American: 48 mL/min — ABNORMAL LOW (ref >=60)
Glucose, Bld: 152 mg/dL — ABNORMAL HIGH (ref 65–99)
Potassium: 4.1 mmol/L (ref 3.5–5.3)
Sodium: 139 mmol/L (ref 135–146)
Total Bilirubin: 0.5 mg/dL (ref 0.2–1.2)
Total Protein: 7.1 g/dL (ref 6.1–8.1)

## 2016-07-21 LAB — TSH: TSH: 2.18 mIU/L

## 2016-07-21 LAB — POCT GLYCOSYLATED HEMOGLOBIN (HGB A1C): Hemoglobin A1C: 6.6

## 2016-07-21 MED ORDER — MOMETASONE FURO-FORMOTEROL FUM 200-5 MCG/ACT IN AERO: 2.0000 | INHALATION_SPRAY | Freq: Two times a day (BID) | RESPIRATORY_TRACT | 0 refills | Status: DC

## 2016-07-21 MED ORDER — MOMETASONE FURO-FORMOTEROL FUM 200-5 MCG/ACT IN AERO: 2.0000 | INHALATION_SPRAY | Freq: Two times a day (BID) | RESPIRATORY_TRACT | 4 refills | Status: DC

## 2016-07-21 MED ORDER — BUDESONIDE-FORMOTEROL FUMARATE 80-4.5 MCG/ACT IN AERO: 2.0000 | INHALATION_SPRAY | Freq: Two times a day (BID) | RESPIRATORY_TRACT | 3 refills | Status: DC

## 2016-07-21 NOTE — Patient Instructions (Signed)
[name],   It was nice to finally meet you today.  I will call you about lab results. Continue metformin 1000 mg twice daily for the time being. Taking this medication with meals can help with diarrhea.  I will try to get dulera approved through your insurance.  Please schedule an eye appointment at your earliest convenience.  Follow-up in about 3 months.   Best, Dr. Ola Spurr

## 2016-07-21 NOTE — Telephone Encounter (Signed)
symbicort 80 Take 2 puffs first thing in am and then another 2 puffs about 12 hours later.     

## 2016-07-21 NOTE — Telephone Encounter (Signed)
Spoke with the pt and notified of recs per MW  She verbalized understanding  Nothing further needed Rx was sent to pharm

## 2016-07-21 NOTE — Progress Notes (Signed)
[facility] Progress Note  Subjective:  Veronica Shaw is a 65 y.o. female with T2DM, asthma, possible Parkinson disease, and HTN who presents for diabetes follow-up.  CHRONIC DIABETES  Disease Monitoring  Blood Sugar Ranges: No higher than 145; checks daily in the morning. Low of 70 about 1.5 months ago.   Polyuria: no   Visual problems: yes, gets red eyes   Medication Compliance: yes, taking metformin 1000 mg BID Medication Side Effects  Hypoglycemia: yes, rarely -- eats something with sugar if she feels jittery             Diarrhea with metformin   [place] Exam: Overdue -- last had 2 years ago  Foot Exam: 03/22/2016  Diet: Tries to eat less rice   Exercise: Some at home; gym worsened asthma  Asthma: - Says dulera works well but was told [employer] will no longer cover - Has tried spiriva and says she had vision changes she could not tolerate - Qvar "irritated" her breathing - Albuterol caused tachycardia so she tries to limit use  Hair loss: - Reports hair coming out in clumps - Says she had hypothyroidism in the past and had been on synthroid but then "over-corrected" and was taken off, per patient - Also reports always being cold and very dry skin - Taking a multivitamin  Past Medical History:  Diagnosis Date  . Asthma   . Chronic systolic CHF (congestive heart failure) ([place])    a. cMRI 4/15: EF 34% and findings - c/w NICM, normal RV size and function (RVEF 61%), Mild MR // b. Echo 2/15:  EF 30-35%, diff HK, ant-sept AK, Gr 2 DD, mild MR, trivial TR  //  c. Echo 5/17: EF 20-25%, severe diffuse HK, marked systolic dyssynchrony, grade 1 diastolic dysfunction, mild MR  //  d. RHC 5/17: Fick CO 2.9, RVSP 19, PASP 15, PW mean 2, low filing pressures and preserved CO   . Diabetes mellitus   . Gastritis   . HTN (hypertension)   . Hyperlipidemia   . NICM (nonischemic cardiomyopathy) ([place])    a. Nuclear 5/13: Normal stress nuclear study. LV  Ejection Fraction: 58%  //  b. LHC 10/14: Minor luminal irregularity in prox LAD, EF 35%   . Parkinson disease ([place])   . Plantar fasciitis   . Sleep apnea    was retested and no longer had it and so d/c CPAP  . Urticaria    Social: Never smoker  Objective: Blood pressure 124/70, pulse 77, temperature 98.1 F (36.7 C), temperature source Oral, height 5\' 2"  (1.575 m), weight 142 lb (64.4 kg), last menstrual period 08/01/2000. Body mass index is 25.97 kg/m. Constitutional: Well-appearing female, in NAD Cardiovascular: RRR, S1, S2, no m/r/g. ICD felt under skin.  Pulmonary/Chest: Effort normal and breath sounds normal. No respiratory distress.  Abdominal: Soft. +BS, NT, ND, no rebound or guarding.  Neurological: AOx3, resting tremor of R hand Integument: Thin hair, can easily see scalp Psychiatric: Normal mood and affect.  Vitals reviewed  Assessment/Plan: Type II diabetes mellitus with complication - Well controlled with hgb A1c of 6.6, compared to 6.8 four months ago - Counseled on importance of yearly eye exams (and also on dulera). Provided handout on ophthalmologists for patient to be able compare prices at different offices.  - Counseled to take metformin with meals to help with diarrhea - Will check CMP to look at SCr, as has had decrease in GFR; if GFR <  48, recommend stopping metformin; could consider a sulfonylurea vs GLP-1 agonist  Mild persistent asthma in adult without complication - Patient reports good control with dulera once daily. - Recommend twice daily dosing; if has worsening dizziness, consider decreasing to 100 mcg inhaler - Provided sample inhaler while pursuing prior authorization for dulera due to failure with what insurance considers other first-line agents  Hair loss - Will check TSH (though normal last year) - If normal, could recommend rogaine though has potential side effects of HAs and dizziness, which patient already struggles with - Continue  multivitamin  Follow-up in 3 months for DM or sooner as needed.  Olene Floss, MD [place], PGY-2

## 2016-07-21 NOTE — Telephone Encounter (Signed)
Veronica Shaw is not covered by pt's insurance- pt must try and fail symbicort and breo.   MW please advise on alternative med.  Thanks!

## 2016-07-21 NOTE — Assessment & Plan Note (Signed)
-   Patient reports good control with dulera once daily. - Recommend twice daily dosing; if has worsening dizziness, consider decreasing to 100 mcg inhaler - Provided sample inhaler while pursuing prior authorization for dulera due to failure with what insurance considers other first-line agents

## 2016-07-21 NOTE — Assessment & Plan Note (Signed)
-   Will check TSH (though normal last year) - If normal, could recommend rogaine though has potential side effects of HAs and dizziness, which patient already struggles with - Continue multivitamin

## 2016-07-21 NOTE — Assessment & Plan Note (Addendum)
-   Well controlled with hgb A1c of 6.6, compared to 6.8 four months ago - Counseled on importance of yearly eye exams (and also on dulera). Provided handout on ophthalmologists for patient to be able compare prices at different offices.  - Counseled to take metformin with meals to help with diarrhea - Will check CMP to look at SCr, as has had decrease in GFR; if GFR < 45, recommend stopping metformin; could consider a sulfonylurea vs GLP-1 agonist

## 2016-07-21 NOTE — Progress Notes (Signed)
910 [id number]  

## 2016-07-26 ENCOUNTER — Encounter (HOSPITAL_COMMUNITY): Payer: Self-pay | Admitting: Emergency Medicine

## 2016-07-26 ENCOUNTER — Ambulatory Visit (HOSPITAL_COMMUNITY)
Admission: EM | Admit: 2016-07-26 | Discharge: 2016-07-26 | Disposition: A | Discharge status: Home / Self Care | Payer: Medicare Other | Attending: Family Medicine | Admitting: Family Medicine

## 2016-07-26 DIAGNOSIS — M5432 Sciatica, left side: Secondary | ICD-10-CM

## 2016-07-26 MED ORDER — METHYLPREDNISOLONE ACETATE 40 MG/ML IJ SUSP
INTRAMUSCULAR | Status: AC
  Filled 2016-07-26: qty 1

## 2016-07-26 MED ORDER — METHYLPREDNISOLONE ACETATE 40 MG/ML IJ SUSP
40.0000 mg | Freq: Once | INTRAMUSCULAR | Status: AC
  Administered 2016-07-26: 40 mg via INTRAMUSCULAR

## 2016-07-26 MED ORDER — PREDNISONE 20 MG PO TABS: ORAL_TABLET | ORAL | 0 refills | Status: DC

## 2016-07-26 MED ORDER — CYCLOBENZAPRINE HCL 5 MG PO TABS: 5.0000 mg | ORAL_TABLET | Freq: Three times a day (TID) | ORAL | 0 refills | Status: DC | PRN

## 2016-07-26 NOTE — ED Triage Notes (Signed)
The patient presented to the [facility] with a complaint of back pain that starts in her back and radiates down her right hip and leg. The patient stated that it started 5 days ago but has been a recurrent issue due to disc problems.

## 2016-07-26 NOTE — ED Provider Notes (Signed)
[place]    CSN: [id number] Arrival date & time: 07/26/16  [id number]     History   Chief Complaint Chief Complaint  Patient presents with  . Back Pain    HPI Veronica Shaw is a 65 y.o. female.   The patient presented to the [facility] with a complaint of back pain that starts in her back and radiates down her left hip and leg. The patient stated that it started 5 days ago but has been a recurrent issue due to disc problems.  Patient states that she did not fall or have any trauma. In the past or problems been on the right side, but today the left hip and leg are affected. She's had no trouble with urination or elimination. She also denies fever or weakness or loss of sensation.  She has diabetes. When she's been treated before with a right sciatica, she did not have significant elevations of her blood sugar. She was told at that time that she may need surgery but because she did not have any insurance, she delayed that avenue of treatment.      Past Medical History:  Diagnosis Date  . Asthma   . Chronic systolic CHF (congestive heart failure) ([place])    a. cMRI 4/15: EF 34% and findings - c/w NICM, normal RV size and function (RVEF 61%), Mild MR // b. Echo 2/15:  EF 30-35%, diff HK, ant-sept AK, Gr 2 DD, mild MR, trivial TR  //  c. Echo 5/17: EF 20-25%, severe diffuse HK, marked systolic dyssynchrony, grade 1 diastolic dysfunction, mild MR  //  d. RHC 5/17: Fick CO 2.9, RVSP 19, PASP 15, PW mean 2, low filing pressures and preserved CO   . Diabetes mellitus   . Gastritis   . HTN (hypertension)   . Hyperlipidemia   . NICM (nonischemic cardiomyopathy) ([place])    a. Nuclear 5/13: Normal stress nuclear study. LV Ejection Fraction: 58%  //  b. LHC 10/14: Minor luminal irregularity in prox LAD, EF 35%   . Parkinson disease ([place])   . Plantar fasciitis   . Sleep apnea    was retested and no longer had it and so d/c CPAP  . Urticaria     Patient Active Problem List   Diagnosis  Date Noted  . Hair loss 07/21/2016  . Acute effusion of both middle ears 04/28/2016  . Suprapubic pain 04/14/2016  . Allergic rhinitis 11/09/2015  . Upper airway cough syndrome 08/20/2015  . Osteopenia 07/10/2015  . Mild persistent asthma in adult without complication [id number]  . Bunion, left foot 05/20/2014  . NICM (nonischemic cardiomyopathy) ([place]) 04/23/2014  . Arthritis or polyarthritis, rheumatoid ([place]) 03/12/2014  . ICD (implantable cardioverter-defibrillator), dual, in situ 03/06/2014  . Thoracic or lumbosacral neuritis or radiculitis, unspecified 07/03/2013  . Chronic systolic heart failure ([place]) 05/31/2013  . Parkinson disease ([place]) 05/21/2013  . 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 Major depression 03/31/2013  . Loss of balance 03/03/2013  . Lower extremity edema 03/03/2013  . GERD (gastroesophageal reflux disease) 09/27/2012  . Blurry vision, bilateral 09/27/2012  . Plantar fasciitis, bilateral 06/15/2012  . Cervical pain (neck) 04/12/2012  . 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 Diabetic peripheral neuropathy ([place]) 12/09/2011  . Hyperlipidemia 12/09/2011  . Sleep apnea 12/09/2011  . Fatigue 10/13/2011  . Chronic urticaria 05/27/2011  . 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 05/20/2011  . BACK PAIN WITH RADICULOPATHY 07/22/2009  . ROSACEA 05/29/2009  . ACHILLES BURSITIS OR TENDINITIS 03/27/2009  . Type  II diabetes mellitus with complication ([place]) [id number]  . Essential hypertension 09/28/2006    Past Surgical History:  Procedure Laterality Date  . BUNIONECTOMY    . CARDIAC CATHETERIZATION N/A 12/16/2015   Procedure: Right Heart Cath;  Surgeon: Sherren Mocha, MD;  Location: [facility];  Service: Cardiovascular;  Laterality: N/A;  . CHOLECYSTECTOMY    . IMPLANTABLE CARDIOVERTER DEFIBRILLATOR IMPLANT  11-25-13   MDT dual chamber ICD implanted by [name] for primary prevention  . IMPLANTABLE CARDIOVERTER DEFIBRILLATOR IMPLANT N/A 11/25/2013   Procedure:  IMPLANTABLE CARDIOVERTER DEFIBRILLATOR IMPLANT;  Surgeon: [name], MD;  Location: [facility] CATH LAB;  Service: Cardiovascular;  Laterality: N/A;  . LEFT AND RIGHT HEART CATHETERIZATION WITH CORONARY ANGIOGRAM N/A 05/31/2013   Procedure: LEFT AND RIGHT HEART CATHETERIZATION WITH CORONARY ANGIOGRAM;  Surgeon: [name], MD;  Location: [facility] CATH LAB;  Service: Cardiovascular;  Laterality: N/A;  . TONSILLECTOMY    . TUBAL LIGATION      OB History    No data available       Home Medications    Prior to Admission medications   Medication Sig Start Date End Date Taking? Authorizing Provider  albuterol (PROVENTIL HFA;VENTOLIN HFA) 108 (90 Base) MCG/ACT inhaler Inhale 2 puffs into the lungs every 4 (four) hours as needed for wheezing or shortness of breath.    Historical Provider, MD  albuterol (PROVENTIL) (2.5 MG/3ML) 0.083% nebulizer solution 2.5 mg.    Historical Provider, MD  cholestyramine (QUESTRAN) 4 GM/DOSE powder Take 4 g by mouth daily.  01/07/16   Historical Provider, MD  cyclobenzaprine (FLEXERIL) 5 MG tablet Take 1 tablet (5 mg total) by mouth 3 (three) times daily as needed for muscle spasms. 07/26/16   Robyn Haber, MD  EPINEPHrine (EPIPEN) 0.3 mg/0.3 mL SOAJ injection Inject 0.3 mg into the muscle as needed (allergic reaction). Reported on 12/22/2015    Historical Provider, MD  fluticasone (FLONASE) 50 MCG/ACT nasal spray Place 2 sprays into both nostrils daily. 04/28/16   Lupita Dawn, MD  furosemide (LASIX) 40 MG tablet Take 20 mg by mouth daily as needed for fluid (For swelling).  06/30/14   Historical Provider, MD  gabapentin (NEURONTIN) 100 MG capsule Take 100 mg by mouth 3 times/day as needed-between meals & bedtime (pain).     Historical Provider, MD  metFORMIN (GLUCOPHAGE) 500 MG tablet TAKE TWO TABLETS BY MOUTH TWICE DAILY WITH A MEAL 02/08/16   Hillary Corinda Gubler, MD  metoprolol succinate (TOPROL-XL) 50 MG 24 hr tablet TAKE 1 AND 1/2 TABLETS DAILY FOR 1 WEEK, THEN  INCREASE TO 2 TABLETS DAILY AS DIRECTED (DOSE CHANGE) 06/20/16   [name], MD  mometasone-formoterol (DULERA) 200-5 MCG/ACT AERO Inhale 2 puffs into the lungs 2 (two) times daily. 07/21/16   Hillary Corinda Gubler, MD  mometasone-formoterol [facility]) 200-5 MCG/ACT AERO Inhale 2 puffs into the lungs 2 (two) times daily. 07/21/16   Hillary Corinda Gubler, MD  nitroGLYCERIN (NITROSTAT) 0.4 MG SL tablet Place 1 tablet (0.4 mg total) under the tongue every 5 (five) minutes as needed for chest pain (MAX 3 tablets). Patient not taking: Reported on 07/01/2016 05/04/16   [name], MD  omeprazole (PRILOSEC) 40 MG capsule Take 1 capsule (40 mg total) by mouth daily. 11/09/15   Frazier Richards, MD  potassium chloride (K-DUR,KLOR-CON) 10 MEQ tablet Take 1 tablet (10 mEq total) by mouth daily as needed (Take with lasix as needed for swelling). 07/06/16   Hillary Corinda Gubler, MD  predniSONE (DELTASONE) 20 MG tablet Two daily with food 07/26/16   Robyn Haber, MD  spironolactone (ALDACTONE) 25 MG tablet Take 1 tablet (25 mg total) by mouth every morning. 04/22/16   Sherren Mocha, MD  triamcinolone (KENALOG) 0.025 % cream Apply 1 application topically 2 (two) times daily.    Historical Provider, MD  valsartan (DIOVAN) 40 MG tablet TAKE 1 TABLET EVERY DAY (DISCONTINUE DIOVAN 80 MG) 07/06/16   Sherren Mocha, MD    Family History Family History  Problem Relation Age of Onset  . Coronary artery disease Father     Died age 19  . Heart attack Father   . Diabetes Father   . Coronary artery disease Mother     Died age 27  . Heart attack Mother   . Diabetes Mother   . Stroke Mother   . Parkinson's disease Sister   . Heart disease Sister   . Hepatitis C Sister   . Diabetes Son 37    T1DM  . Diabetes Sister   . Heart disease Sister   . Diabetes Sister   . Heart disease Sister     Social History Social History  Substance Use Topics  . Smoking status: Never Smoker  . Smokeless tobacco: Never  Used     Comment: exposed to smoke during child hood (parents)  . Alcohol use No     Allergies   Aspirin; Penicillins; Prempro [conj estrog-medroxyprogest ace]; Ace inhibitors; and Simvastatin   Review of Systems Review of Systems  Constitutional: Negative.   HENT: Negative.   Respiratory: Negative.   Cardiovascular: Negative.   Gastrointestinal: Positive for diarrhea.  Musculoskeletal: Positive for back pain.  Neurological: Negative.      Physical Exam Triage Vital Signs ED Triage Vitals  Enc Vitals Group     BP 07/26/16 1047 (!) 133/51     Pulse Rate 07/26/16 1047 88     Resp 07/26/16 1047 16     Temp 07/26/16 1047 99.2 F (37.3 C)     Temp Source 07/26/16 1047 Oral     SpO2 07/26/16 1047 100 %     Weight --      Height --      Head Circumference --      Peak Flow --      Pain Score 07/26/16 1052 9     Pain Loc --      Pain Edu? --      Excl. in [place]? --    No data found.   Updated Vital Signs BP (!) 133/51 (BP Location: Left Arm)   Pulse 88   Temp 99.2 F (37.3 C) (Oral)   Resp 16   LMP 08/01/2000 (Approximate)   SpO2 100%    Physical Exam  Constitutional: She is oriented to person, place, and time. She appears well-developed and well-nourished.  HENT:  Right Ear: External ear normal.  Left Ear: External ear normal.  Mouth/Throat: Oropharynx is clear and moist.  Eyes: Conjunctivae and EOM are normal.  Neck: Neck supple.  Musculoskeletal: Normal range of motion.  Neurological: She is alert and oriented to person, place, and time. She displays normal reflexes. No sensory deficit. She exhibits normal muscle tone.  Positive straight leg raising on left only.  Skin: Skin is warm and dry.  Nursing note and vitals reviewed.    UC Treatments / Results  Labs (all labs ordered are listed, but only abnormal results are displayed) Labs Reviewed - No data to display  EKG  EKG Interpretation None       Radiology No results  found.  Procedures Procedures (including critical care time)  Medications Ordered in UC Medications  methylPREDNISolone acetate (DEPO-MEDROL) injection 40 mg (not administered)     Initial Impression / Assessment and Plan / UC Course  I have reviewed the triage vital signs and the nursing notes.  Pertinent labs & imaging results that were available during my care of the patient were reviewed by me and considered in my medical decision making (see chart for details).  Clinical Course     Final Clinical Impressions(s) / UC Diagnoses   Final diagnoses:  Sciatica of left side    New Prescriptions New Prescriptions   CYCLOBENZAPRINE (FLEXERIL) 5 MG TABLET    Take 1 tablet (5 mg total) by mouth 3 (three) times daily as needed for muscle spasms.   PREDNISONE (DELTASONE) 20 MG TABLET    Two daily with food  Follow-up with your doctor if not improving in 2-3 days.   Robyn Haber, MD 07/26/16 [phone]

## 2016-07-26 NOTE — Discharge Instructions (Signed)
Follow-up with your doctor in 2-3 days if you're not improving.  Picture you monitor your blood sugar to make sure that it does not get out of control.

## 2016-08-08 ENCOUNTER — Ambulatory Visit (INDEPENDENT_AMBULATORY_CARE_PROVIDER_SITE_OTHER): Payer: Medicare Other | Admitting: *Deleted

## 2016-08-08 DIAGNOSIS — I428 Other cardiomyopathies: Secondary | ICD-10-CM | POA: Diagnosis not present

## 2016-08-08 DIAGNOSIS — I5022 Chronic systolic (congestive) heart failure: Secondary | ICD-10-CM | POA: Diagnosis not present

## 2016-08-08 DIAGNOSIS — Z95810 Presence of automatic (implantable) cardiac defibrillator: Secondary | ICD-10-CM

## 2016-08-09 NOTE — Progress Notes (Signed)
Remote ICD transmission.   

## 2016-08-09 NOTE — Progress Notes (Signed)
EPIC Encounter for ICM Monitoring  Patient Name: Veronica Shaw is a 66 y.o. female Date: 08/09/2016 Primary Care Physican: Darci Needle, MD Primary Cardiologist:[place] Electrophysiologist: Lovena Le Dry Weight:     unknown      Heart Failure questions reviewed, pt asymptomatic but did have some weight gain and shortness of breath during Christmas which correlates with decreased Optivol impedance.  She took prn Furosemide for 2 days and symptoms relieved.  Thoracic impedance normal.  Recommendations: No changes.  Reinforced to limit low salt food choices to 2000 mg day and limiting fluid intake to < 2 liters per day. Encouraged to call for fluid symptoms.    Follow-up plan: ICM clinic phone appointment on 09/09/2016.  Copy of ICM check sent to device physician.   3 month ICM trend : 08/08/2016   1 Year ICM trend:      [name], RN 08/09/2016 3:02 PM

## 2016-08-10 ENCOUNTER — Encounter: Payer: Self-pay | Admitting: Cardiology

## 2016-08-11 ENCOUNTER — Institutional Professional Consult (permissible substitution): Payer: Medicare Other | Admitting: Pulmonary Disease

## 2016-08-11 LAB — CUP PACEART REMOTE DEVICE CHECK
Battery Remaining Longevity: 106 mo
Battery Voltage: 3 V
Brady Statistic AP VP Percent: 0 %
Brady Statistic AP VS Percent: 0.48 %
Brady Statistic AS VP Percent: 0.03 %
Brady Statistic AS VS Percent: 99.49 %
Brady Statistic RA Percent Paced: 0.48 %
Brady Statistic RV Percent Paced: 0.03 %
Date Time Interrogation Session: 20180108051703
HighPow Impedance: 88 Ohm
Implantable Lead Implant Date: 20150427
Implantable Lead Implant Date: 20150427
Implantable Lead Location: 753859
Implantable Lead Location: 753860
Implantable Lead Model: 5076
Implantable Lead Model: 6935
Implantable Pulse Generator Implant Date: 20150427
Lead Channel Impedance Value: 4047 Ohm
Lead Channel Impedance Value: 4047 Ohm
Lead Channel Impedance Value: 4047 Ohm
Lead Channel Impedance Value: 418 Ohm
Lead Channel Impedance Value: 646 Ohm
Lead Channel Impedance Value: 779 Ohm
Lead Channel Pacing Threshold Amplitude: 0.5 V
Lead Channel Pacing Threshold Amplitude: 0.625 V
Lead Channel Pacing Threshold Pulse Width: 0.4 ms
Lead Channel Pacing Threshold Pulse Width: 0.4 ms
Lead Channel Sensing Intrinsic Amplitude: 1.5 mV
Lead Channel Sensing Intrinsic Amplitude: 1.5 mV
Lead Channel Sensing Intrinsic Amplitude: 19.125 mV
Lead Channel Sensing Intrinsic Amplitude: 19.125 mV
Lead Channel Setting Pacing Amplitude: 2 V
Lead Channel Setting Pacing Amplitude: 2.5 V
Lead Channel Setting Pacing Pulse Width: 0.4 ms
Lead Channel Setting Sensing Sensitivity: 0.3 mV

## 2016-08-12 ENCOUNTER — Encounter: Payer: Self-pay | Admitting: Family Medicine

## 2016-08-12 ENCOUNTER — Ambulatory Visit (INDEPENDENT_AMBULATORY_CARE_PROVIDER_SITE_OTHER): Payer: Medicare Other | Admitting: Family Medicine

## 2016-08-12 VITALS — BP 130/60 | HR 82 | Temp 98.2°F | Ht 62.0 in | Wt 140.0 lb

## 2016-08-12 DIAGNOSIS — M5430 Sciatica, unspecified side: Secondary | ICD-10-CM

## 2016-08-12 MED ORDER — CYCLOBENZAPRINE HCL 5 MG PO TABS
5.0000 mg | ORAL_TABLET | Freq: Three times a day (TID) | ORAL | 2 refills | Status: DC | PRN
Start: 2016-08-12 — End: 2017-10-09

## 2016-08-12 NOTE — Progress Notes (Signed)
   Subjective:    Patient ID: Veronica Shaw, female    DOB: 04-Sep-1950, 66 y.o.   MRN: [id number]   CC: Back and Leg pain in need of pain medication refill  HPI: Veronica Shaw is a 66 yo female with a past medical history significant for Parkinson's disease, HFrEF 20-25% s/p ICD, chronic back pain with radiculopathy, arthritis, acute bilateral ear effusion who is here today for left side back pain.  Back pain: Patient reports that she has had chronic back pain on her left side shooting down her leg all the way to her toes. Patient reports pain started around christmas, and she was recently seen at [facility] and was prescribed flexeril 5 mg that she was taking 3 times a day as needed at first and reports improvement in her symptoms. Patient said initially she was unable to move or get out of bed, but after starting flexeril she regain her functionality. Patient has been taking 1 tab a day the past few days because pain has been gradually returning. She is here today essentially for a refill of her medications.  Smoking status reviewed   ROS: all other systems were reviewed and are negative other than in the HPI   Past medical history, surgical, family, and social history reviewed and updated in the EMR as appropriate.  Objective:  BP 130/60   LMP 08/01/2000 (Approximate)   Vitals and nursing note reviewed  General: NAD, pleasant, able to participate in exam Cardiac: RRR, normal heart sounds, no murmurs. 2+ radial and PT pulses bilaterally Respiratory: CTAB, normal effort, No wheezes, rales or rhonchi Abdomen: soft, nontender, nondistended, no hepatic or splenomegaly, +BS Extremities: no edema or cyanosis. WWP. Skin: warm and dry, no rashes noted Neuro: alert and oriented x4, no focal deficits Psych: Normal affect and mood   Assessment & Plan:    #Left lower extremity radiculopathy Patient seem to have good relief from flexeril, back pain most likely secondary to spasms. Will  refill patient's prescription, had an extensive discussion on the frequency and dosage of the medications. Patient has a good understanding on how to take medications and to not overuse or rely on it for too long. --Refill cyclobenzaprine 5 mg as needed  --Follow up in Clinic if pain persist for reevaluation   Marjie Skiff, MD Family Medicine Resident PGY-1

## 2016-09-09 ENCOUNTER — Ambulatory Visit (INDEPENDENT_AMBULATORY_CARE_PROVIDER_SITE_OTHER): Payer: Medicare Other

## 2016-09-09 DIAGNOSIS — I5022 Chronic systolic (congestive) heart failure: Secondary | ICD-10-CM

## 2016-09-09 DIAGNOSIS — Z95810 Presence of automatic (implantable) cardiac defibrillator: Secondary | ICD-10-CM | POA: Diagnosis not present

## 2016-09-09 NOTE — Progress Notes (Signed)
EPIC Encounter for ICM Monitoring  Patient Name: Veronica Shaw is a 66 y.o. female Date: 09/09/2016 Primary Care Physican: Darci Needle, MD Primary Cardiologist:[place]oper Electrophysiologist: Lovena Le Dry Weight:141 lbs        Heart Failure questions reviewed, pt asymptomatic.   She was symptomatic with SOB and swelling of one leg during time of decreased impedance but was resolved with prn Furosemide  Thoracic impedance returned normal was abnormal suggesting fluid accumulation from 08/29/2016 to 09/07/2016.  Recommendations: No changes. Reminded to limit dietary salt intake to 2000 mg/day and fluid intake to < 2 liters/day. Encouraged to call for fluid symptoms.  Follow-up plan: ICM clinic phone appointment on 10/10/2016.  Copy of ICM check sent to device physician.   3 month ICM trend: 09/09/2016   1 Year ICM trend:      [name], RN 09/09/2016 7:57 AM

## 2016-10-10 ENCOUNTER — Ambulatory Visit (INDEPENDENT_AMBULATORY_CARE_PROVIDER_SITE_OTHER): Payer: Medicare Other

## 2016-10-10 DIAGNOSIS — I5022 Chronic systolic (congestive) heart failure: Secondary | ICD-10-CM

## 2016-10-10 DIAGNOSIS — Z95810 Presence of automatic (implantable) cardiac defibrillator: Secondary | ICD-10-CM | POA: Diagnosis not present

## 2016-10-11 NOTE — Progress Notes (Signed)
EPIC Encounter for ICM Monitoring  Patient Name: Veronica Shaw is a 66 y.o. female Date: 10/11/2016 Primary Care Physican: Darci Needle, MD Primary Cardiologist:[place]oper Electrophysiologist: Druscilla Brownie Weight:unknown         Transmission reviewed   Thoracic impedance normal   Prescribed dosage: Furosemide 40 mg 0.5 tablet (20 mg total) as needed.  Potassium 10 mEq 1 tablet (10 mEq total) by mouth daily as needed (Take with lasix as needed for swelling).  Recommendations: None  Follow-up plan: ICM clinic phone appointment on 11/14/2016.  Copy of ICM check sent to device physician.   3 month ICM trend: 11/10/2016   1 Year ICM trend:      [name], RN 10/11/2016 3:36 PM

## 2016-10-31 ENCOUNTER — Telehealth: Payer: Self-pay | Admitting: Internal Medicine

## 2016-10-31 NOTE — Telephone Encounter (Signed)
New Message  Per pt feeling better today, and had to use inhaler all weekend  Pt c/o Shortness Of Breath: STAT if SOB developed within the last 24 hours or pt is noticeably SOB on the phone  1. Are you currently SOB (can you hear that pt is SOB on the phone)? Yes  2. How long have you been experiencing SOB? Over the weekend  3. Are you SOB when sitting or when up moving around? Sitting  4. Are you currently experiencing any other symptoms? Dizziness, and tired

## 2016-10-31 NOTE — Telephone Encounter (Signed)
Pt states the whole weekend she has been short of breath. Pt states this morning she is short of breath, has abdominal distention,denies lower extremity edema, pt states tightness in chest now  and feeling like she can't get her breath when she lays down. Pt sounds short of breath/wheezing over the telephone. Pt advised to go to [facility] ED for further evaluation, pt agreed.

## 2016-11-08 ENCOUNTER — Ambulatory Visit (INDEPENDENT_AMBULATORY_CARE_PROVIDER_SITE_OTHER): Payer: Medicare Other | Admitting: Family Medicine

## 2016-11-08 ENCOUNTER — Encounter: Payer: Self-pay | Admitting: Family Medicine

## 2016-11-08 DIAGNOSIS — H65193 Other acute nonsuppurative otitis media, bilateral: Secondary | ICD-10-CM

## 2016-11-08 DIAGNOSIS — H6593 Unspecified nonsuppurative otitis media, bilateral: Secondary | ICD-10-CM

## 2016-11-08 MED ORDER — FLUTICASONE PROPIONATE 50 MCG/ACT NA SUSP: 2.0000 | Freq: Every day | NASAL | 1 refills | Status: DC

## 2016-11-08 MED ORDER — OXYMETAZOLINE HCL 0.05 % NA SOLN: 1.0000 | Freq: Two times a day (BID) | NASAL | 0 refills | Status: AC

## 2016-11-08 NOTE — Progress Notes (Signed)
    Subjective:  Veronica Shaw is a 66 y.o. female who presents to the [facility] today with a chief complaint of ear pressure.   HPI:  Ear Pressure Patient reports that this is a chronic problem for her, but noticed a significant worsening of her symptoms about a month ago and again worsened about a week a go. Most of her symptoms are in the left ear, though she also occasionalyl has symptoms in her right ear. Describes it as a "fullness". Also reports occasional vertigo symptoms. Denies any syncopal episodes. No weakness or numbness. No fevers or chills. Occasionally has blurred vision. She has not tried any treatments. Associated symptoms include nasal congestion and cough.   ROS: Per HPI  PMH: Smoking history reviewed.   Objective:  Physical Exam: BP 112/64   Pulse 77   Temp 99.2 F (37.3 C) (Oral)   Wt 142 lb (64.4 kg)   LMP 08/01/2000 (Approximate)   SpO2 97%   BMI 25.97 kg/m   Gen: NAD, resting comfortably HEENT: Right TM with clear effusion. Left TM with clear effusion. Nasal mucosa erythematous and boggy. OP clear.  CV: RRR with no murmurs appreciated Pulm: NWOB, CTAB with no crackles, wheezes, or rhonchi MSK: no edema, cyanosis, or clubbing noted Skin: warm, dry Neuro: grossly normal, moves all extremities Psych: Normal affect and thought content  Assessment/Plan:  Middle ear effusion, bilateral Secondary to eustachian tube dysfunction. Will restart flonase. Will use afrin for 3 days. Return precautions reviewed. Follow up in 2 weeks if not improving. May consider referral to ENT if continues to have symptoms despite treatment.   Algis Greenhouse. Jerline Pain, [place] Resident PGY-3 11/08/2016 9:53 AM

## 2016-11-08 NOTE — Patient Instructions (Signed)
Start the afrin and flonase today.  Use the afrin for 3 days then stop.  Continue the flonase for 2 weeks.  If your symptoms are not better, please let us know.  Take care,  Dr Jerline Pain

## 2016-11-08 NOTE — Assessment & Plan Note (Signed)
Secondary to eustachian tube dysfunction. Will restart flonase. Will use afrin for 3 days. Return precautions reviewed. Follow up in 2 weeks if not improving. May consider referral to ENT if continues to have symptoms despite treatment.

## 2016-11-14 ENCOUNTER — Ambulatory Visit (INDEPENDENT_AMBULATORY_CARE_PROVIDER_SITE_OTHER): Payer: Medicare Other | Admitting: *Deleted

## 2016-11-14 ENCOUNTER — Telehealth: Payer: Self-pay

## 2016-11-14 ENCOUNTER — Telehealth: Payer: Self-pay | Admitting: Cardiology

## 2016-11-14 DIAGNOSIS — I428 Other cardiomyopathies: Secondary | ICD-10-CM

## 2016-11-14 DIAGNOSIS — Z95810 Presence of automatic (implantable) cardiac defibrillator: Secondary | ICD-10-CM

## 2016-11-14 DIAGNOSIS — I5022 Chronic systolic (congestive) heart failure: Secondary | ICD-10-CM | POA: Diagnosis not present

## 2016-11-14 NOTE — Progress Notes (Signed)
EPIC Encounter for ICM Monitoring  Patient Name: Veronica Shaw is a 66 y.o. female Date: 11/14/2016 Primary Care Physican: Darci Needle, MD Primary Cardiologist:[place] Electrophysiologist: Druscilla Brownie Weight:unknown        Attempted call to patient and unable to reach.  Left detailed message regarding transmission.  Transmission reviewed.    Thoracic impedance normal.  Prescribed dosage: Furosemide 40 mg 0.5 tablet (20 mg total) as needed.  Potassium 10 mEq 1 tablet (10 mEq total) by mouth daily as needed (Take with lasix as needed for swelling).  Recommendations: Left voice mail with ICM number and encouraged to call for fluid symptoms.  Follow-up plan: ICM clinic phone appointment on 12/15/2016.    Copy of ICM check sent to device physician.   3 month ICM trend: 11/14/2016   1 Year ICM trend:      [name], RN 11/14/2016 1:24 PM

## 2016-11-14 NOTE — Telephone Encounter (Signed)
Remote ICM transmission received.  Attempted patient call and left detailed message regarding transmission and next ICM scheduled for 12/15/2016.  Advised to return call for any fluid symptoms or questions.   

## 2016-11-14 NOTE — Telephone Encounter (Signed)
Spoke with pt and reminded pt of remote transmission that is due today. Pt verbalized understanding.   

## 2016-11-15 LAB — CUP PACEART REMOTE DEVICE CHECK
Battery Remaining Longevity: 101 mo
Battery Voltage: 2.99 V
Brady Statistic AP VP Percent: 0 %
Brady Statistic AP VS Percent: 0.07 %
Brady Statistic AS VP Percent: 0.03 %
Brady Statistic AS VS Percent: 99.9 %
Brady Statistic RA Percent Paced: 0.07 %
Brady Statistic RV Percent Paced: 0.03 %
Date Time Interrogation Session: 20180416114226
HighPow Impedance: 75 Ohm
Implantable Lead Implant Date: 20150427
Implantable Lead Implant Date: 20150427
Implantable Lead Location: 753859
Implantable Lead Location: 753860
Implantable Lead Model: 5076
Implantable Lead Model: 6935
Implantable Pulse Generator Implant Date: 20150427
Lead Channel Impedance Value: 361 Ohm
Lead Channel Impedance Value: 4047 Ohm
Lead Channel Impedance Value: 4047 Ohm
Lead Channel Impedance Value: 4047 Ohm
Lead Channel Impedance Value: 551 Ohm
Lead Channel Impedance Value: 646 Ohm
Lead Channel Pacing Threshold Amplitude: 0.5 V
Lead Channel Pacing Threshold Amplitude: 0.625 V
Lead Channel Pacing Threshold Pulse Width: 0.4 ms
Lead Channel Pacing Threshold Pulse Width: 0.4 ms
Lead Channel Sensing Intrinsic Amplitude: 11.625 mV
Lead Channel Sensing Intrinsic Amplitude: 11.625 mV
Lead Channel Sensing Intrinsic Amplitude: 2.375 mV
Lead Channel Sensing Intrinsic Amplitude: 2.375 mV
Lead Channel Setting Pacing Amplitude: 2 V
Lead Channel Setting Pacing Amplitude: 2.5 V
Lead Channel Setting Pacing Pulse Width: 0.4 ms
Lead Channel Setting Sensing Sensitivity: 0.3 mV

## 2016-11-15 NOTE — Progress Notes (Signed)
Remote ICD transmission.   

## 2016-11-16 ENCOUNTER — Encounter: Payer: Self-pay | Admitting: Cardiology

## 2016-11-16 DIAGNOSIS — H01001 Unspecified blepharitis right upper eyelid: Secondary | ICD-10-CM | POA: Diagnosis not present

## 2016-11-16 DIAGNOSIS — H01004 Unspecified blepharitis left upper eyelid: Secondary | ICD-10-CM | POA: Diagnosis not present

## 2016-11-16 DIAGNOSIS — H00014 Hordeolum externum left upper eyelid: Secondary | ICD-10-CM | POA: Diagnosis not present

## 2016-11-23 ENCOUNTER — Other Ambulatory Visit: Payer: Self-pay | Admitting: Internal Medicine

## 2016-11-23 MED ORDER — CALCIUM CARB-CHOLECALCIFEROL 600-800 MG-UNIT PO TABS: 1.0000 | ORAL_TABLET | Freq: Every day | ORAL | 3 refills | Status: DC

## 2016-11-23 NOTE — Telephone Encounter (Signed)
Pt calling to request refill of:  Name of Medication(s):  caltrate 600 + D Last date of OV:  11-08-16 Pharmacy:  [facility]   Will route refill request to Clinic RN.  Discussed with patient policy to call pharmacy for future refills.  Also, discussed refills may take up to 48 hours to approve or deny.  Renella Cunas

## 2016-12-02 ENCOUNTER — Telehealth: Payer: Self-pay | Admitting: Internal Medicine

## 2016-12-02 DIAGNOSIS — H6590 Unspecified nonsuppurative otitis media, unspecified ear: Secondary | ICD-10-CM

## 2016-12-02 NOTE — Telephone Encounter (Signed)
Pt is still having trouble with her ears. She was told at her last visit with Dr Jerline Pain, if she was no better in 2 weeks, he would consider a referral to ENT.  She would like to pursue this referral. Please advise

## 2016-12-02 NOTE — Telephone Encounter (Signed)
Reviewed Dr. Marigene Ehlers last [place] note and placed referral per patient request.  Olene Floss, MD [place], PGY-2

## 2016-12-13 ENCOUNTER — Ambulatory Visit (HOSPITAL_COMMUNITY)
Admission: EM | Admit: 2016-12-13 | Discharge: 2016-12-13 | Disposition: A | Discharge status: Home / Self Care | Payer: Medicare Other | Attending: Internal Medicine | Admitting: Internal Medicine

## 2016-12-13 ENCOUNTER — Encounter (HOSPITAL_COMMUNITY): Payer: Self-pay | Admitting: Emergency Medicine

## 2016-12-13 DIAGNOSIS — Z7984 Long term (current) use of oral hypoglycemic drugs: Secondary | ICD-10-CM | POA: Insufficient documentation

## 2016-12-13 DIAGNOSIS — N390 Urinary tract infection, site not specified: Secondary | ICD-10-CM

## 2016-12-13 DIAGNOSIS — E785 Hyperlipidemia, unspecified: Secondary | ICD-10-CM | POA: Diagnosis not present

## 2016-12-13 DIAGNOSIS — Z95810 Presence of automatic (implantable) cardiac defibrillator: Secondary | ICD-10-CM | POA: Diagnosis not present

## 2016-12-13 DIAGNOSIS — E119 Type 2 diabetes mellitus without complications: Secondary | ICD-10-CM | POA: Diagnosis not present

## 2016-12-13 DIAGNOSIS — I110 Hypertensive heart disease with heart failure: Secondary | ICD-10-CM | POA: Insufficient documentation

## 2016-12-13 DIAGNOSIS — G20 Parkinson's disease: Secondary | ICD-10-CM | POA: Insufficient documentation

## 2016-12-13 DIAGNOSIS — R310 Gross hematuria: Secondary | ICD-10-CM

## 2016-12-13 DIAGNOSIS — R350 Frequency of micturition: Secondary | ICD-10-CM | POA: Diagnosis present

## 2016-12-13 DIAGNOSIS — R300 Dysuria: Secondary | ICD-10-CM

## 2016-12-13 DIAGNOSIS — M722 Plantar fascial fibromatosis: Secondary | ICD-10-CM | POA: Diagnosis not present

## 2016-12-13 DIAGNOSIS — J45909 Unspecified asthma, uncomplicated: Secondary | ICD-10-CM | POA: Diagnosis not present

## 2016-12-13 DIAGNOSIS — I5022 Chronic systolic (congestive) heart failure: Secondary | ICD-10-CM | POA: Diagnosis not present

## 2016-12-13 DIAGNOSIS — I429 Cardiomyopathy, unspecified: Secondary | ICD-10-CM | POA: Insufficient documentation

## 2016-12-13 DIAGNOSIS — G4730 Sleep apnea, unspecified: Secondary | ICD-10-CM | POA: Insufficient documentation

## 2016-12-13 LAB — POCT URINALYSIS DIP (DEVICE)
Bilirubin Urine: NEGATIVE
Glucose, UA: NEGATIVE mg/dL
Ketones, ur: NEGATIVE mg/dL
Nitrite: NEGATIVE
Protein, ur: 30 mg/dL — AB
Specific Gravity, Urine: 1.01 (ref 1.005–1.030)
Urobilinogen, UA: 0.2 mg/dL (ref 0.0–1.0)
pH: 5.5 (ref 5.0–8.0)

## 2016-12-13 MED ORDER — CIPROFLOXACIN HCL 500 MG PO TABS: 500.0000 mg | ORAL_TABLET | Freq: Two times a day (BID) | ORAL | 0 refills | Status: DC

## 2016-12-13 NOTE — ED Provider Notes (Signed)
CSN: [id number]     Arrival date & time 12/13/16  1002 History   None    Chief Complaint  Patient presents with  . Urinary Frequency  . Hematuria   (Consider location/radiation/quality/duration/timing/severity/associated sxs/prior Treatment) 66 year old female states she believes she has irretractable infection. She has had dysuria with gross hematuria, blood clots for the past apply days. She has been taking AZO and drinking lots of water. She states today she continues to have dysuria but has not seen any blood in the urine.      Past Medical History:  Diagnosis Date  . Asthma   . Chronic systolic CHF (congestive heart failure) ([place])    a. cMRI 4/15: EF 34% and findings - c/w NICM, normal RV size and function (RVEF 61%), Mild MR // b. Echo 2/15:  EF 30-35%, diff HK, ant-sept AK, Gr 2 DD, mild MR, trivial TR  //  c. Echo 5/17: EF 20-25%, severe diffuse HK, marked systolic dyssynchrony, grade 1 diastolic dysfunction, mild MR  //  d. RHC 5/17: Fick CO 2.9, RVSP 19, PASP 15, PW mean 2, low filing pressures and preserved CO   . Diabetes mellitus   . Gastritis   . HTN (hypertension)   . Hyperlipidemia   . NICM (nonischemic cardiomyopathy) ([place])    a. Nuclear 5/13: Normal stress nuclear study. LV Ejection Fraction: 58%  //  b. LHC 10/14: Minor luminal irregularity in prox LAD, EF 35%   . Parkinson disease ([place])   . Plantar fasciitis   . Sleep apnea    was retested and no longer had it and so d/c CPAP  . Urticaria    Past Surgical History:  Procedure Laterality Date  . BUNIONECTOMY    . CARDIAC CATHETERIZATION N/A 12/16/2015   Procedure: Right Heart Cath;  Surgeon: Sherren Mocha, MD;  Location: [facility];  Service: Cardiovascular;  Laterality: N/A;  . CHOLECYSTECTOMY    . IMPLANTABLE CARDIOVERTER DEFIBRILLATOR IMPLANT  11-25-13   MDT dual chamber ICD implanted by [name] for primary prevention  . IMPLANTABLE CARDIOVERTER DEFIBRILLATOR IMPLANT N/A 11/25/2013   Procedure:  IMPLANTABLE CARDIOVERTER DEFIBRILLATOR IMPLANT;  Surgeon: [name], MD;  Location: [facility] CATH LAB;  Service: Cardiovascular;  Laterality: N/A;  . LEFT AND RIGHT HEART CATHETERIZATION WITH CORONARY ANGIOGRAM N/A 05/31/2013   Procedure: LEFT AND RIGHT HEART CATHETERIZATION WITH CORONARY ANGIOGRAM;  Surgeon: [name], MD;  Location: [facility] CATH LAB;  Service: Cardiovascular;  Laterality: N/A;  . TONSILLECTOMY    . TUBAL LIGATION     Family History  Problem Relation Age of Onset  . Coronary artery disease Father        Died age 59  . Heart attack Father   . Diabetes Father   . Coronary artery disease Mother        Died age 33  . Heart attack Mother   . Diabetes Mother   . Stroke Mother   . Parkinson's disease Sister   . Heart disease Sister   . Hepatitis C Sister   . Diabetes Son 37       T1DM  . Diabetes Sister   . Heart disease Sister   . Diabetes Sister   . Heart disease Sister    Social History  Substance Use Topics  . Smoking status: Never Smoker  . Smokeless tobacco: Never Used     Comment: exposed to smoke during child hood (parents)  . Alcohol use No   OB History  No data available     Review of Systems  Constitutional: Negative.   HENT: Negative.   Respiratory: Negative.   Gastrointestinal: Negative.   Genitourinary:       As per history of present illness  Neurological: Negative.   All other systems reviewed and are negative.   Allergies  Aspirin; Penicillins; Prempro [conj estrog-medroxyprogest ace]; Ace inhibitors; and Simvastatin  Home Medications   Prior to Admission medications   Medication Sig Start Date End Date Taking? Authorizing Provider  albuterol (PROVENTIL HFA;VENTOLIN HFA) 108 (90 Base) MCG/ACT inhaler Inhale 2 puffs into the lungs every 4 (four) hours as needed for wheezing or shortness of breath.    [provider]  albuterol (PROVENTIL) (2.5 MG/3ML) 0.083% nebulizer solution 2.5 mg.    [provider]  Calcium  Carb-Cholecalciferol 600-800 MG-UNIT TABS Take 1 tablet by mouth daily. 11/23/16   Rogue Bussing, MD  cholestyramine Lucrezia Starch) 4 GM/DOSE powder Take 4 g by mouth daily.  01/07/16   [provider]  ciprofloxacin (CIPRO) 500 MG tablet Take 1 tablet (500 mg total) by mouth 2 (two) times daily. 12/13/16   Janne Napoleon, NP  cyclobenzaprine (FLEXERIL) 5 MG tablet Take 1 tablet (5 mg total) by mouth 3 (three) times daily as needed for muscle spasms. 08/12/16   Diallo, Earna Coder, MD  fluticasone (FLONASE) 50 MCG/ACT nasal spray Place 2 sprays into both nostrils daily. 11/08/16   Vivi Barrack, MD  furosemide (LASIX) 40 MG tablet Take 20 mg by mouth daily as needed for fluid (For swelling).  06/30/14   [provider]  gabapentin (NEURONTIN) 100 MG capsule Take 100 mg by mouth 3 times/day as needed-between meals & bedtime (pain).     [provider]  metFORMIN (GLUCOPHAGE) 500 MG tablet TAKE TWO TABLETS BY MOUTH TWICE DAILY WITH A MEAL 02/08/16   Rogue Bussing, MD  metoprolol succinate (TOPROL-XL) 50 MG 24 hr tablet TAKE 1 AND 1/2 TABLETS DAILY FOR 1 WEEK, THEN INCREASE TO 2 TABLETS DAILY AS DIRECTED (DOSE CHANGE) 06/20/16   [name], MD  mometasone-formoterol [facility]) 200-5 MCG/ACT AERO Inhale 2 puffs into the lungs 2 (two) times daily. 07/21/16   Rogue Bussing, MD  mometasone-formoterol [facility]) 200-5 MCG/ACT AERO Inhale 2 puffs into the lungs 2 (two) times daily. 07/21/16   Rogue Bussing, MD  omeprazole (PRILOSEC) 40 MG capsule Take 1 capsule (40 mg total) by mouth daily. 11/09/15   Frazier Richards, MD  potassium chloride (K-DUR,KLOR-CON) 10 MEQ tablet Take 1 tablet (10 mEq total) by mouth daily as needed (Take with lasix as needed for swelling). 07/06/16   Rogue Bussing, MD  predniSONE (DELTASONE) 20 MG tablet Two daily with food 07/26/16   Robyn Haber, MD  spironolactone (ALDACTONE) 25 MG tablet Take 1 tablet (25 mg  total) by mouth every morning. 04/22/16   Sherren Mocha, MD  triamcinolone (KENALOG) 0.025 % cream Apply 1 application topically 2 (two) times daily.    [provider]  valsartan (DIOVAN) 40 MG tablet TAKE 1 TABLET EVERY DAY (DISCONTINUE DIOVAN 80 MG) 07/06/16   Sherren Mocha, MD   Meds Ordered and Administered this Visit  Medications - No data to display  BP (!) 128/47 (BP Location: Right Arm)   Pulse 86   Temp 98.9 F (37.2 C) (Oral)   Resp 16   LMP 08/01/2000 (Approximate)   SpO2 98%  No data found.   Physical Exam  Constitutional: She is oriented to  person, place, and time. She appears well-developed and well-nourished. No distress.  Cardiovascular: Normal rate.   Pulmonary/Chest: Effort normal.  Neurological: She is alert and oriented to person, place, and time.  Skin: Skin is warm and dry.  Psychiatric: She has a normal mood and affect.  Nursing note and vitals reviewed.   Urgent Care Course     Procedures (including critical care time)  Labs Review Labs Reviewed  POCT URINALYSIS DIP (DEVICE) - Abnormal; Notable for the following:       Result Value   Hgb urine dipstick LARGE (*)    Protein, ur 30 (*)    Leukocytes, UA MODERATE (*)    All other components within normal limits    Imaging Review No results found.   Visual Acuity Review  Right Eye Distance:   Left Eye Distance:   Bilateral Distance:    Right Eye Near:   Left Eye Near:    Bilateral Near:         MDM   1. Lower urinary tract infectious disease   2. Gross hematuria   3. Dysuria    Take the antibiotic as directed. If all you are taking it you develop a rash, swelling or other problems stopped taking it. This is not a medicine related to penicillin. Drink plenty of fluids, stay well-hydrated and may continue taking the AZO. For fever, chills, bladder pain or back pain seek medical attention promptly. Meds ordered this encounter  Medications  . ciprofloxacin (CIPRO) 500 MG  tablet    Sig: Take 1 tablet (500 mg total) by mouth 2 (two) times daily.    Dispense:  10 tablet    Refill:  0    Order Specific Question:   Supervising Provider    Answer:   Jacklynn Ganong [[id number]]       Janne Napoleon, NP 12/13/16 1109

## 2016-12-13 NOTE — ED Triage Notes (Signed)
The patient presented to the [facility] with a complaint of urinary frequency and hematuria that started 3 days ago. The patient also complained of some dizziness that she believed to be related to her ears.

## 2016-12-13 NOTE — Discharge Instructions (Signed)
Take the antibiotic as directed. If all you are taking it you develop a rash, swelling or other problems stopped taking it. This is not a medicine related to penicillin. Drink plenty of fluids, stay well-hydrated and may continue taking the AZO. For fever, chills, bladder pain or back pain seek medical attention promptly.

## 2016-12-14 LAB — URINE CULTURE: Culture: <10000 — AB

## 2016-12-15 ENCOUNTER — Ambulatory Visit (INDEPENDENT_AMBULATORY_CARE_PROVIDER_SITE_OTHER): Payer: Medicare Other

## 2016-12-15 ENCOUNTER — Telehealth: Payer: Self-pay

## 2016-12-15 DIAGNOSIS — Z95810 Presence of automatic (implantable) cardiac defibrillator: Secondary | ICD-10-CM | POA: Diagnosis not present

## 2016-12-15 DIAGNOSIS — I5022 Chronic systolic (congestive) heart failure: Secondary | ICD-10-CM | POA: Diagnosis not present

## 2016-12-15 NOTE — Progress Notes (Signed)
EPIC Encounter for ICM Monitoring  Patient Name: Veronica Shaw is a 66 y.o. female Date: 12/15/2016 Primary Care Physican: Rogue Bussing, MD Primary Cardiologist:[place] Electrophysiologist: Druscilla Brownie Weight:unknown      Attempted call to patient and unable to reach.  Left message to return call regarding transmission.  Transmission reviewed.  ER visit on 12/13/2016 for UTI   Thoracic impedance abnormal suggesting fluid accumulation since 12/07/2016 but starting to trend toward baseline.  Prescribed dosage: Furosemide 40 mg 0.5 tablet (20 mg total) as needed. Potassium 10 mEq 1 tablet (10 mEq total) by mouth daily as needed (Take with lasix as needed for swelling).   Recommendations: NONE - Unable to reach patient   Follow-up plan: ICM clinic phone appointment on 12/22/2016.   Copy of ICM check sent to primary cardiologist and device physician.   3 month ICM trend: 12/15/2016   1 Year ICM trend:      [name], RN 12/15/2016 11:25 AM

## 2016-12-15 NOTE — Telephone Encounter (Signed)
Remote ICM transmission received.  Attempted patient call and left message to return call regarding transmission.    

## 2016-12-19 NOTE — Progress Notes (Signed)
Patient returned call.  She has not been feeling well for several weeks. Reviewed transmission and explained the report suggest she has been accumulating fluid since 12/08/2016 and she reported an increase in shortness of breath and general malaise during this time. She tried using her inhaler for SOB but it did not improve her breathing.  She decided to self adjust her lasix to 40 mg daily along with potassium for the last few weeks and it improved her breathing but has had some leg cramps.  She has reduced the Lasix to 20 mg and taking it daily instead of as needed.  Advised will order BMET for 12/21/2016 to check potassium since she reported having some leg cramping.   Next ICM remote transmission scheduled for 12/22/2016.  Advised would send copy of note to Dr Burt Knack and [name] for review.

## 2016-12-19 NOTE — Addendum Note (Signed)
Addended by: [name] on: 12/19/2016 03:26 PM   Modules accepted: Orders

## 2016-12-21 ENCOUNTER — Other Ambulatory Visit: Payer: Medicare Other | Admitting: *Deleted

## 2016-12-21 DIAGNOSIS — I5022 Chronic systolic (congestive) heart failure: Secondary | ICD-10-CM | POA: Diagnosis not present

## 2016-12-21 LAB — BASIC METABOLIC PANEL
BUN/Creatinine Ratio: 18 (ref 12–28)
BUN: 24 mg/dL (ref 8–27)
CO2: 25 mmol/L (ref 18–29)
Calcium: 10 mg/dL (ref 8.7–10.3)
Chloride: 101 mmol/L (ref 96–106)
Creatinine, Ser: 1.33 mg/dL — ABNORMAL HIGH (ref 0.57–1.00)
GFR calc Af Amer: 48 mL/min/{1.73_m2} — ABNORMAL LOW (ref >59)
GFR calc non Af Amer: 41 mL/min/{1.73_m2} — ABNORMAL LOW (ref >59)
Glucose: 135 mg/dL — ABNORMAL HIGH (ref 65–99)
Potassium: 5.1 mmol/L (ref 3.5–5.2)
Sodium: 140 mmol/L (ref 134–144)

## 2016-12-22 ENCOUNTER — Ambulatory Visit (INDEPENDENT_AMBULATORY_CARE_PROVIDER_SITE_OTHER): Payer: Medicare Other | Admitting: Family Medicine

## 2016-12-22 ENCOUNTER — Encounter: Payer: Self-pay | Admitting: Family Medicine

## 2016-12-22 ENCOUNTER — Ambulatory Visit (INDEPENDENT_AMBULATORY_CARE_PROVIDER_SITE_OTHER): Payer: Self-pay

## 2016-12-22 VITALS — BP 110/66 | HR 61 | Temp 98.4°F | Ht 62.0 in | Wt 140.8 lb

## 2016-12-22 DIAGNOSIS — R0600 Dyspnea, unspecified: Secondary | ICD-10-CM | POA: Diagnosis not present

## 2016-12-22 DIAGNOSIS — E138 Other specified diabetes mellitus with unspecified complications: Secondary | ICD-10-CM | POA: Diagnosis not present

## 2016-12-22 DIAGNOSIS — I5022 Chronic systolic (congestive) heart failure: Secondary | ICD-10-CM

## 2016-12-22 DIAGNOSIS — Z95810 Presence of automatic (implantable) cardiac defibrillator: Secondary | ICD-10-CM

## 2016-12-22 LAB — POCT GLYCOSYLATED HEMOGLOBIN (HGB A1C): Hemoglobin A1C: 6.8

## 2016-12-22 NOTE — Progress Notes (Signed)
Subjective:    Patient ID: Veronica Shaw , female   DOB: 1951-07-14 , 66 y.o..   MRN: [id number]  HPI  Veronica Shaw is a 66 yo female with PMH of asthma and CHF here for a same day visit for: Chief Complaint  Patient presents with  . Breathing Problem    1. Dyspnea: Patient states that for the last 3 weeks she has felt shortness of breath with exertion. She notes that she has had "fluid in the lungs" previously and was most recently told that it was resolved by a nurse after a study that she did. She had to take an extra diuretic at one point as well. She admits to intermittent chest pain with exertion. She notes that she follows with cardiology but has not seen them in a year. She has a defibrillator in place. She takes furosemide every other day and spironolactone every day. Her baseline wt is 140 lbs. Denies any weight changes. Does not feel fluid overloaded. Notes that she has had asthma since she was 66 years old due to secondhand smoke.   Review of Systems: Per HPI. All other systems reviewed and are negative.   Past Medical History: Patient Active Problem List   Diagnosis Date Noted  . Hair loss 07/21/2016  . Middle ear effusion, bilateral 04/28/2016  . Suprapubic pain 04/14/2016  . Allergic rhinitis 11/09/2015  . Upper airway cough syndrome 08/20/2015  . Osteopenia 07/10/2015  . Mild persistent asthma in adult without complication 40/98/1191  . Bunion, left foot 05/20/2014  . NICM (nonischemic cardiomyopathy) ([place]) 04/23/2014  . 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 ICD (implantable cardioverter-defibrillator), dual, in situ 03/06/2014  . Thoracic or lumbosacral neuritis or radiculitis, unspecified 07/03/2013  . Chronic systolic heart failure ([place]) 05/31/2013  . Parkinson disease ([place]) 05/21/2013  . 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 Major depression 03/31/2013  . Loss of balance 03/03/2013  . Lower extremity edema 03/03/2013  . GERD  (gastroesophageal reflux disease) 09/27/2012  . Blurry vision, bilateral 09/27/2012  . Dyspnea 09/19/2012  . Plantar fasciitis, bilateral 06/15/2012  . Cervical pain (neck) 04/12/2012  . 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 Diabetic peripheral neuropathy ([place]) 12/09/2011  . Hyperlipidemia 12/09/2011  . Sleep apnea 12/09/2011  . Fatigue 10/13/2011  . Chronic urticaria 05/27/2011  . 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 05/20/2011  . Sciatic leg pain 07/22/2009  . ROSACEA 05/29/2009  . ACHILLES BURSITIS OR TENDINITIS 03/27/2009  . Type II diabetes mellitus with complication ([place]) 47/82/9562  . Essential hypertension 09/28/2006    Medications: reviewed   Social Hx:  reports that she has never smoked. She has never used smokeless tobacco.   Objective:   BP 110/66   Pulse 61   Temp 98.4 F (36.9 C) (Oral)   Ht 5\' 2"  (1.575 m)   Wt 140 lb 12.8 oz (63.9 kg)   LMP 08/01/2000 (Approximate)   SpO2 99%   BMI 25.75 kg/m  Physical Exam  Gen: Pleasant, NAD, alert, cooperative with exam, well-appearing HEENT: NCAT, PERRL, clear conjunctiva, oropharynx clear, supple neck Cardiac: Regular rate and rhythm, normal S1/S2, no edema, capillary refill brisk  Respiratory: Clear to auscultation bilaterally, no wheezes, non-labored breathing, does have slightly increased respiratory rate at baseline Psych: good insight, normal mood and affect  Assessment & Plan:  Dyspnea Subjective dyspnea. Normal respiratory exam today with slight increased respiratory rate. O2 99% on room air. No signs of infectious etiology such as pneumonia. No signs of CHF exacerbation  as patient is at her dry weight and no edema. Less likely asthma exacerbation as lungs are clear to auscultation. Less likely PE, no risks for DVT and no leg pain or swelling.  - Advised that patient continue to take her CHF medications as prescribed - Continue Albuterol inhaler PRN - Will check CBC to rule out any anemia that could be causing symptoms -  Advised patient to please make an appointment with her cardiologist ASAP given significant CHF history and poor follow up - Follow up with PCP in 3-4 days to see how dyspnea is  Orders Placed This Encounter  Procedures  . CBC with Differential  . POCT glycosylated hemoglobin (Hb A1C)    Smitty Cords, MD [place], PGY-2

## 2016-12-22 NOTE — Patient Instructions (Signed)
Thank you for coming in today, it was so nice to see you! Today we talked about:    Shortness of breath. I do not think your lungs or heart are causing your symptoms. I would like to get some blood work to make sure that is normal.   Make an appointment with your cardiologist as soon as possible  Make an appointment with Dr. Ola Spurr for you annual wellness visit  Please follow up in 3-5 days to see how your shortness of breath is. You can follow up with me or any provider who is available. If you are able to see Dr. Ola Spurr within the next week then you do not need to schedule this appt too. You can schedule this appointment at the front desk before you leave or call the clinic.  Bring in all your medications or supplements to each appointment for review.   If we ordered any tests today, you will be notified via telephone of any abnormalities. If everything is normal you will get a letter in the mail.   If you have any questions or concerns, please do not hesitate to call the office at [phone]. You can also message me directly via MyChart.   Sincerely,  Smitty Cords, MD

## 2016-12-22 NOTE — Progress Notes (Signed)
EPIC Encounter for ICM Monitoring  Patient Name: Veronica Shaw is a 66 y.o. female Date: 12/22/2016 Primary Care Physican: Rogue Bussing, MD Primary Cardiologist:[place] Electrophysiologist: Druscilla Brownie Weight:unknown           Heart Failure questions reviewed, pt reported breathing had improved a few days last week but now is worse again.  She has tried using her inhalers but it is not helping.  Clarification on how she has been taking Furosemide.  She took 40 mg for a few days about 2 weeks ago.  She decreased it back to 20 mg and is taking every other day along with Potassium in the last 2 weeks.     Thoracic impedance returned to normal but was abnormal 12/07/2016 to 12/18/2016 suggesting fluid accumulation.    Prescribed dosage: Furosemide 40 mg 0.5 tablet (20 mg total) as needed. Potassium 10 mEq 1 tablet (10 mEq total) by mouth daily as needed (Take with lasix as needed for swelling).  Spironolactone 25 mg 1 tablet daily.  Patient is taking Furosemide and Potassium every other day instead of as needed.  Lab results showed Potassium and Creatinine have increased since 07/2016.  She is also on Spironolactone.   Labs: 12/21/2016 Creatinine 1.33, BUN 24, Potassium 5.1, Sodium 140 07/21/2016 Creatinine 1.18, BUN 17, Potassium 4.1, Sodium 139, EGFR 48-55  Recommendations:  She is going to call her PCP today because they will see her right away about her breathing. She is waiting on her lab results from 12/21/2016 that were ordered due to she complained of leg cramping.  Encouraged to call for fluid symptoms or use local ER for any urgent symptoms.  Follow-up plan: ICM clinic phone appointment on 01/16/2017.  Advised she is overdue to see Dr Burt Knack and should make an appointment at first availability.    Copy of ICM check sent to Dr Burt Knack and [name] for review and if any recommendations will call her back.   3 month ICM trend: 12/22/2016   1 Year ICM trend:      [name], RN 12/22/2016 8:36 AM

## 2016-12-23 LAB — CBC WITH DIFFERENTIAL/PLATELET
Basophils Absolute: 0.1 10*3/uL (ref 0.0–0.2)
Basos: 1 %
EOS (ABSOLUTE): 0.4 10*3/uL (ref 0.0–0.4)
Eos: 6 %
Hematocrit: 40.3 % (ref 34.0–46.6)
Hemoglobin: 13.4 g/dL (ref 11.1–15.9)
Immature Grans (Abs): 0 10*3/uL (ref 0.0–0.1)
Immature Granulocytes: 0 %
Lymphocytes Absolute: 1.9 10*3/uL (ref 0.7–3.1)
Lymphs: 30 %
MCH: 29.6 pg (ref 26.6–33.0)
MCHC: 33.3 g/dL (ref 31.5–35.7)
MCV: 89 fL (ref 79–97)
Monocytes Absolute: 0.4 10*3/uL (ref 0.1–0.9)
Monocytes: 7 %
Neutrophils Absolute: 3.6 10*3/uL (ref 1.4–7.0)
Neutrophils: 56 %
Platelets: 233 10*3/uL (ref 150–379)
RBC: 4.53 x10E6/uL (ref 3.77–5.28)
RDW: 12.9 % (ref 12.3–15.4)
WBC: 6.4 10*3/uL (ref 3.4–10.8)

## 2016-12-26 NOTE — Assessment & Plan Note (Addendum)
Subjective dyspnea. Normal respiratory exam today with slight increased respiratory rate. O2 99% on room air. No signs of infectious etiology such as pneumonia. No signs of CHF exacerbation as patient is at her dry weight and no edema. Less likely asthma exacerbation as lungs are clear to auscultation. Less likely PE, no risks for DVT and no leg pain or swelling.  - Advised that patient continue to take her CHF medications as prescribed - Continue Albuterol inhaler PRN - Will check CBC to rule out any anemia that could be causing symptoms - Advised patient to please make an appointment with her cardiologist ASAP given significant CHF history and she reports she hasn't been seen in a year - Follow up with PCP in 3-4 days to see how dyspnea is

## 2016-12-27 ENCOUNTER — Encounter: Payer: Self-pay | Admitting: Family Medicine

## 2016-12-28 ENCOUNTER — Encounter: Payer: Self-pay | Admitting: Physician Assistant

## 2016-12-28 ENCOUNTER — Encounter (INDEPENDENT_AMBULATORY_CARE_PROVIDER_SITE_OTHER): Payer: Self-pay

## 2016-12-28 ENCOUNTER — Ambulatory Visit (INDEPENDENT_AMBULATORY_CARE_PROVIDER_SITE_OTHER): Payer: Medicare Other | Admitting: Physician Assistant

## 2016-12-28 VITALS — BP 122/60 | HR 74 | Ht 62.0 in | Wt 141.8 lb

## 2016-12-28 DIAGNOSIS — R0789 Other chest pain: Secondary | ICD-10-CM

## 2016-12-28 DIAGNOSIS — I10 Essential (primary) hypertension: Secondary | ICD-10-CM

## 2016-12-28 DIAGNOSIS — R0602 Shortness of breath: Secondary | ICD-10-CM

## 2016-12-28 DIAGNOSIS — I5022 Chronic systolic (congestive) heart failure: Secondary | ICD-10-CM

## 2016-12-28 DIAGNOSIS — Z95810 Presence of automatic (implantable) cardiac defibrillator: Secondary | ICD-10-CM | POA: Diagnosis not present

## 2016-12-28 DIAGNOSIS — I428 Other cardiomyopathies: Secondary | ICD-10-CM

## 2016-12-28 MED ORDER — OMEPRAZOLE 40 MG PO CPDR: 40.0000 mg | DELAYED_RELEASE_CAPSULE | ORAL | 3 refills | Status: DC

## 2016-12-28 NOTE — Patient Instructions (Signed)
Medication Instructions:  Your physician has recommended you make the following change in your medication:  1. Change your Prilosec to 40 mg once a day 30 minutes before breakfast for 2 weeks. Then take as needed.   Labwork: Labs today: BMET, BNP  Testing/Procedures: Your physician has requested that you have an echocardiogram. Echocardiography is a painless test that uses sound waves to create images of your heart. It provides your doctor with information about the size and shape of your heart and how well your heart's chambers and valves are working. This procedure takes approximately one hour. There are no restrictions for this procedure.   Your physician has requested that you have a lexiscan myoview. For further information please visit [email or web address]. Please follow instruction sheet, as given.     Follow-Up: Your physician recommends that you schedule a follow-up appointment in: 1 month with Richardson Dopp, PA same day Dr. Burt Knack is in the office.   Any Other Special Instructions Will Be Listed Below (If Applicable).     If you need a refill on your cardiac medications before your next appointment, please call your pharmacy.

## 2016-12-28 NOTE — Progress Notes (Signed)
Cardiology Office Note:    Date:  12/28/2016   ID:  ONIE KASPAREK, DOB September 19, 1950, MRN [id number]  PCP:  Rogue Bussing, MD  Cardiologist:  Dr. Sherren Mocha   Electrophysiologist:  Dr. Cristopher Peru   Referring MD: Larey Seat*   Chief Complaint  Patient presents with  . Shortness of Breath    History of Present Illness:    Veronica Shaw is a 66 y.o. female with a hx of nonischemic cardiomyopathy, systolic CHF, s/p AICD 12/3333, diabetes, HL, sleep apnea, asthma.  Echocardiogram in 5/17 demonstrated EF 45-62%, mild diastolic dysfunction and mild MR. RHC in 5/17 demonstrated preserved cardiac output and low intracardiac filling pressures. She was felt to be well compensated with her congestive heart failure medical therapy was continued.  She was last seen in 01/2016.    She has recently reported increased shortness of breath during [facility] follow up.  Her thoracic impedence had suggested fluid overload, but was back to normal around 12/18/16.    [name] returns for follow-up. She has chronic dyspnea on exertion. Over the last month, she has felt this is worse. She notes increased abdominal girth. She also notes chest discomfort. This will occur with activity. She does tell me that she can take antacids with relief. She does not take nitroglycerin. She's also had some right arm pain. She denies syncope but does get dizzy at times. She has not received any shocks from her ICD. She sleeps on an incline chronically without change (5 pillows). She denies PND. She denies LE edema. She denies pleuritic chest pain. She does note chest discomfort with lying supine that improves with sitting up. She notes a cough without significant change. Using her inhaler does not help. She has noted increasing wheezing. She had a UTI recently that was treated. This is resolved. She denies dysphagia or odynophagia.  She denies any bleeding issues.   Prior CV studies:   The following studies  were reviewed today:  RHC 12/16/15 CO 2.9; mean RA 1, PASP 15, mean PA 10, mean PCWP 2 1. Low intracardiac filling pressures 2. Preserved cardiac output   Echo 12/11/15 EF 20-25%, severe diffuse HK, marked systolic dyssynchrony, grade 1 diastolic dysfunction, mild MR   Cardiac MRI (4/15):   1. Mild LVE, EF 34%, Diff HK, paradoxical septal motion c/w LBBB, small focal area of late gadolinium enhancement at basal anteroseptum - may represent RV volume overload or a focal infiltrative disease such as sarcoidosis, but certainly wouldn't explain the degree of of LV dysfunction. - c/w NICM, normal RV size and function (RVEF 61%), Mild MR  LHC (10/14):   Minor luminal irregularity in prox LAD, EF 35%   Echo (2/15):   EF 30-35%, diff HK, ant-sept AK, Gr 2 DD, mild MR, trivial TR   Nuclear (5/13):   Normal stress nuclear study. LV Ejection Fraction: 58%   Past Medical History:  Diagnosis Date  . Asthma   . Chronic systolic CHF (congestive heart failure) ([place])    a. cMRI 4/15: EF 34% and findings - c/w NICM, normal RV size and function (RVEF 61%), Mild MR // b. Echo 2/15:  EF 30-35%, diff HK, ant-sept AK, Gr 2 DD, mild MR, trivial TR  //  c. Echo 5/17: EF 20-25%, severe diffuse HK, marked systolic dyssynchrony, grade 1 diastolic dysfunction, mild MR  //  d. RHC 5/17: Fick CO 2.9, RVSP 19, PASP 15, PW mean 2, low filing pressures and preserved CO   .  Diabetes mellitus   . Gastritis   . HTN (hypertension)   . Hyperlipidemia   . NICM (nonischemic cardiomyopathy) ([place])    a. Nuclear 5/13: Normal stress nuclear study. LV Ejection Fraction: 58%  //  b. LHC 10/14: Minor luminal irregularity in prox LAD, EF 35%   . Parkinson disease ([place])   . Plantar fasciitis   . Sleep apnea    was retested and no longer had it and so d/c CPAP  . Urticaria     Past Surgical History:  Procedure Laterality Date  . BUNIONECTOMY    . CARDIAC CATHETERIZATION N/A 12/16/2015   Procedure: Right Heart Cath;  Surgeon:  Sherren Mocha, MD;  Location: [facility];  Service: Cardiovascular;  Laterality: N/A;  . CHOLECYSTECTOMY    . IMPLANTABLE CARDIOVERTER DEFIBRILLATOR IMPLANT  11-25-13   MDT dual chamber ICD implanted by [name] for primary prevention  . IMPLANTABLE CARDIOVERTER DEFIBRILLATOR IMPLANT N/A 11/25/2013   Procedure: IMPLANTABLE CARDIOVERTER DEFIBRILLATOR IMPLANT;  Surgeon: [name], MD;  Location: [facility] CATH LAB;  Service: Cardiovascular;  Laterality: N/A;  . LEFT AND RIGHT HEART CATHETERIZATION WITH CORONARY ANGIOGRAM N/A 05/31/2013   Procedure: LEFT AND RIGHT HEART CATHETERIZATION WITH CORONARY ANGIOGRAM;  Surgeon: [name], MD;  Location: [facility] CATH LAB;  Service: Cardiovascular;  Laterality: N/A;  . TONSILLECTOMY    . TUBAL LIGATION      Current Medications: Current Meds  Medication Sig  . albuterol (PROVENTIL HFA;VENTOLIN HFA) 108 (90 Base) MCG/ACT inhaler Inhale 2 puffs into the lungs every 4 (four) hours as needed for wheezing or shortness of breath.  [name] Kitchen albuterol (PROVENTIL) (2.5 MG/3ML) 0.083% nebulizer solution 2.5 mg.  . Calcium Carb-Cholecalciferol 600-800 MG-UNIT TABS Take 1 tablet by mouth daily.  . cholestyramine (QUESTRAN) 4 GM/DOSE powder Take 4 g by mouth daily.   . ciprofloxacin (CIPRO) 500 MG tablet Take 1 tablet (500 mg total) by mouth 2 (two) times daily.  . cyclobenzaprine (FLEXERIL) 5 MG tablet Take 1 tablet (5 mg total) by mouth 3 (three) times daily as needed for muscle spasms.  . fluticasone (FLONASE) 50 MCG/ACT nasal spray Place 2 sprays into both nostrils daily.  . furosemide (LASIX) 40 MG tablet Take 20 mg by mouth daily as needed for fluid (For swelling).   . gabapentin (NEURONTIN) 100 MG capsule Take 100 mg by mouth 3 times/day as needed-between meals & bedtime (pain).   . metFORMIN (GLUCOPHAGE) 500 MG tablet TAKE TWO TABLETS BY MOUTH TWICE DAILY WITH A MEAL  . metoprolol succinate (TOPROL-XL) 50 MG 24 hr tablet TAKE 1 AND 1/2 TABLETS DAILY FOR 1  WEEK, THEN INCREASE TO 2 TABLETS DAILY AS DIRECTED (DOSE CHANGE)  . mometasone-formoterol (DULERA) 200-5 MCG/ACT AERO Inhale 2 puffs into the lungs 2 (two) times daily.  . mometasone-formoterol (DULERA) 200-5 MCG/ACT AERO Inhale 2 puffs into the lungs 2 (two) times daily.  . potassium chloride (K-DUR,KLOR-CON) 10 MEQ tablet Take 1 tablet (10 mEq total) by mouth daily as needed (Take with lasix as needed for swelling).  . predniSONE (DELTASONE) 20 MG tablet Two daily with food  . spironolactone (ALDACTONE) 25 MG tablet Take 1 tablet (25 mg total) by mouth every morning.  . triamcinolone (KENALOG) 0.025 % cream Apply 1 application topically 2 (two) times daily.  . valsartan (DIOVAN) 40 MG tablet TAKE 1 TABLET EVERY DAY (DISCONTINUE DIOVAN 80 MG)  . [DISCONTINUED] omeprazole (PRILOSEC) 40 MG capsule Take 1 capsule (40 mg total) by mouth daily.  Allergies:   Aspirin; Penicillins; Prempro [conj estrog-medroxyprogest ace]; Ace inhibitors; and Simvastatin   Social History   Social History  . Marital status: Married    Spouse name: N/A  . Number of children: 2  . Years of education: N/A   Occupational History  . retired     Quarry manager   Social History Main Topics  . Smoking status: Never Smoker  . Smokeless tobacco: Never Used     Comment: exposed to smoke during child hood (parents)  . Alcohol use No  . Drug use: No  . Sexual activity: Yes    Birth control/ protection: Surgical, Post-menopausal   Other Topics Concern  . None   Social History Narrative   Lives at home with husband and the dog.     Family Hx: The patient's family history includes Coronary artery disease in her father and mother; Diabetes in her father, mother, sister, and sister; Diabetes (age of onset: 62) in her son; Heart attack in her father and mother; Heart disease in her sister, sister, and sister; Hepatitis C in her sister; Parkinson's disease in her sister; Stroke in her mother.  ROS:   Please see the history  of present illness.    Review of Systems  Respiratory: Positive for shortness of breath and snoring.   Gastrointestinal: Positive for constipation.  Neurological: Positive for dizziness and loss of balance.   All other systems reviewed and are negative.   EKGs/Labs/Other Test Reviewed:    EKG:  EKG is  ordered today.  The ekg ordered today demonstrates NSR, HR 74, LBBB, no change from prior tracing  Recent Labs: 07/21/2016: ALT 12; TSH 2.18 12/21/2016: BUN 24; Creatinine, Ser 1.33; Potassium 5.1; Sodium 140 12/22/2016: Platelets 233   Recent Lipid Panel    Component Value Date/Time   CHOL 172 05/31/2013 0435   TRIG 142 05/31/2013 0435   HDL 49 05/31/2013 0435   CHOLHDL 3.5 05/31/2013 0435   VLDL 28 05/31/2013 0435   LDLCALC 95 05/31/2013 0435   LDLDIRECT 111 (H) 02/10/2015 1145     Physical Exam:    VS:  BP 122/60   Pulse 74   Ht 5\' 2"  (1.575 m)   Wt 141 lb 12.8 oz (64.3 kg)   LMP 08/01/2000 (Approximate)   SpO2 97%   BMI 25.94 kg/m     Wt Readings from Last 3 Encounters:  12/28/16 141 lb 12.8 oz (64.3 kg)  12/22/16 140 lb 12.8 oz (63.9 kg)  11/08/16 142 lb (64.4 kg)     Physical Exam  Constitutional: She is oriented to person, place, and time. She appears well-developed and well-nourished. No distress.  HENT:  Head: Normocephalic and atraumatic.  Eyes: No scleral icterus.  Neck: Normal range of motion. No JVD present.  Cardiovascular: Normal rate, regular rhythm, S1 normal, S2 normal and normal heart sounds.   No murmur heard. Pulmonary/Chest: She has decreased breath sounds. She has no wheezes. She has no rhonchi. She has no rales.  Abdominal: Soft. There is no tenderness.  Musculoskeletal: She exhibits no edema.  Neurological: She is alert and oriented to person, place, and time.  Skin: Skin is warm and dry.  Psychiatric: She has a normal mood and affect.    ASSESSMENT:    1. Shortness of breath   2. Other chest pain   3. Chronic systolic heart  failure ([place])   4. NICM (nonischemic cardiomyopathy) ([place])   5. Essential hypertension   6. ICD (implantable cardioverter-defibrillator), dual, in situ  PLAN:    In order of problems listed above:  1. Shortness of breath -  Etiology not entirely clear. She has a long history of shortness of breath. She had a right heart catheterization last year that demonstrated normal filling pressures. Her weight is unchanged. Recent thoracic impedance returned to normal indicating normal fluid status. On exam today, she does not appear to be volume overloaded. I question whether or not her dyspnea is related to asthma. She has seen pulmonology in the past but does not wish to follow-up with that provider. She does have symptoms of acid reflux which could be impacting her asthma. It has been several years since her left-sided heart catheterization.  I will arrange stress testing to rule out ischemia. She cannot walk on a treadmill given her LBBB.  -  Arrange Lexiscan Myoview  -  Arrange follow-up echocardiogram  -  Obtain BMET, BNP  -  If cardiac testing normal, consider referral to pulmonology (she requests to not see Dr. Melvyn Novas)  2. Other chest pain - Etiology not entirely clear. I suspect acid reflux more than ischemia. As noted, it has been several years since her heart catheterization. Since she cannot walk on a treadmill given her LBBB, she will undergo a Lexiscan Myoview. I will also obtain an echocardiogram to rule out pericardial effusion. I have asked her to increase her Prilosec to 40 mg QD x 2 weeks, then prn.  Consider referral to GI if cardiac workup normal and symptoms persist.   3. Chronic systolic heart failure ([place]) -  Thoracic impedance was recently abnormal. However, this has returned to baseline. Her exam does not suggest volume excess. BNP is pending today. Continue current dose of Lasix. Blood pressure has limited further titration of heart failure medications. Continue current dose of  beta blocker, ARB, aldosterone antagonist. I do not think her blood pressure would tolerate changing her ARB to ARNI Delene Loll).    4. NICM (nonischemic cardiomyopathy) ([place]) Last echo with EF 20-25.  Repeat echo pending as noted.   5. Essential hypertension - The patient's blood pressure is controlled on her current regimen.  Continue current therapy.    6. ICD (implantable cardioverter-defibrillator), dual, in situ - FU with EP as planned.   Total time spent with patient today 40  minutes. This includes reviewing records, evaluating the patient and coordinating care. Face-to-face time >50%.   Dispo:  Return in about 4 weeks (around 01/25/2017) for Close Follow Up, w/ Richardson Dopp, PA-C.   Medication Adjustments/Labs and Tests Ordered: Current medicines are reviewed at length with the patient today.  Concerns regarding medicines are outlined above.  Orders/Tests:  Orders Placed This Encounter  Procedures  . Basic Metabolic Panel (BMET)  . Pro b natriuretic peptide  . Myocardial Perfusion Imaging  . EKG 12-Lead  . ECHOCARDIOGRAM COMPLETE   Medication changes: Meds ordered this encounter  Medications  . omeprazole (PRILOSEC) 40 MG capsule    Sig: Take 1 capsule (40 mg total) by mouth as directed. Take 40 mg 30 minutes before breakfast for 2 weeks. Then take as needed.    Dispense:  30 capsule    Refill:  3   Signed, Richardson Dopp, PA-C  12/28/2016 5:01 PM    [facility] [address], [place], [place]  [address] Phone: [phone]; Fax: [phone]

## 2016-12-28 NOTE — Progress Notes (Signed)
Veronica Shaw was seen today in the movement disorders clinic for neurologic consultation at the request of Ola Spurr Sharman Cheek, MD.  The patient has previously been following with Dr. Kyra Searles and her physician assistant, although she has not been seen at [facility] since March, 2015 and has not been seen by Dr. Hall Busing since October, 2014.  I have reviewed [facility] records.  She was first seen by [facility] in August, 2014 and was diagnosed with probable Parkinson's disease and was started on levodopa at that visit.  Her complaints at that time were pain all over, loss of balance, and right hand tremor.  When she followed up in October, 2014 she was only taking the levodopa twice a day, but UPDRS motor score had greatly improved.  She was told to increase the carbidopa/levodopa 25/100-1-1/2 tablets 3 times a day, but when she followed up in March, 2015 she did not think that that made a significant difference.  She was told to continue the medication.  As above, she has not been seen back by the neurology clinic at [facility] since that time.  She is off of carbidopa/levodopa as she doesn't think that she has parkinsons disease.  She states that she has been off of the medication for the last 2 years.  She does state that she often goes to the right side and will lose balance    12/22/15 update:  The patient follows up today regarding Parkinson's disease.  She was started on pramipexole 0.5 mg and supposed to work up to tid.  She admits that she didn't take it.  Her sister told her not to.  She states that she "told me before that she tried it" but I reminded her that she tried levodopa but only took it bid and then she d/c it.  She was worried about dyskinesia.  She states that she went to a prayer meeting and thinks that she may be cured.  She admits that she occasionally uses a cane because she goes to the right.    She denies any compulsive behaviors.  Denies sleep attacks.  No falls.  No hallucinations.   No lightheadedness or near syncope.  I did review records since last visit.  She underwent a right heart cath on 12/16/2015.  There was low filling pressures, but normal cardiac output.  Dr. Burt Knack suspected her shortness of breath secondary to asthma.  She states that has limited her exercise activities.    07/01/16 update:  The patient follows up today regarding Parkinson's disease.  She refuses any medication for her disease.  Reports that she has had a few falls but "I have not had any broken bones."  She reports that she takes coconut oil now as she heard it was good for her on the news.  No hallucinations, lightheadedness or near syncope.  I have reviewed her records since our last visit.  No new cardiac issues, although she continues to follow with cardiology.  She does have an ICD.  She is having some headaches in the temporal region 2 times per week and last one hour.  It feels throbbing.  Bilateral temporal.  Go away on own without med.   No longer taking neurontin.  She is no longer going to gym but is exercising some at home.  Gym exercise made her asthma worse.  She is now raising her 1 year old grandson.    12/30/16 update:  Patient seen today in follow-up for her Parkinson's disease.  She has refused medicine for quite some time for her Parkinson's disease.  Pt denies falls.  Having some near falls.  Pt having some dizziness but told that she had fluid in her ear and has appt next week with ENT.   "I know its my ear because I have ear pain." She has had no near syncope.  No hallucinations.  Mood has been good.   Going to the gym and "I do some granny exercises but they work [place] out."  C/o R shoulder pain - states that her sciatica has moved to her shoulder.   PREVIOUS MEDICATIONS: Sinemet (pt was only taking bid and didn't think helped)  ALLERGIES:   Allergies  Allergen Reactions  . Aspirin Swelling and Other (See Comments)    Causes nose bleeds *ONLY THE COATED ASA*  . Penicillins  Shortness Of Breath and Rash    Shortness of Breath - Throat felt like it was closing.   [name] [Conj Estrog-Medroxyprogest Ace] Shortness Of Breath    Throat swelling  . Ace Inhibitors Cough  . Simvastatin Other (See Comments) and Rash    Muscle aches    CURRENT MEDICATIONS:  Outpatient Encounter Prescriptions as of 12/30/2016  Medication Sig  . albuterol (PROVENTIL HFA;VENTOLIN HFA) 108 (90 Base) MCG/ACT inhaler Inhale 2 puffs into the lungs every 4 (four) hours as needed for wheezing or shortness of breath.  [name] Kitchen albuterol (PROVENTIL) (2.5 MG/3ML) 0.083% nebulizer solution 2.5 mg.  . Calcium Carb-Cholecalciferol 600-800 MG-UNIT TABS Take 1 tablet by mouth daily.  . ciprofloxacin (CIPRO) 500 MG tablet Take 1 tablet (500 mg total) by mouth 2 (two) times daily.  . cyclobenzaprine (FLEXERIL) 5 MG tablet Take 1 tablet (5 mg total) by mouth 3 (three) times daily as needed for muscle spasms.  . fluticasone (FLONASE) 50 MCG/ACT nasal spray Place 2 sprays into both nostrils daily.  . furosemide (LASIX) 40 MG tablet Take 20 mg by mouth daily as needed for fluid (For swelling).   . metFORMIN (GLUCOPHAGE) 500 MG tablet TAKE TWO TABLETS BY MOUTH TWICE DAILY WITH A MEAL  . metoprolol succinate (TOPROL-XL) 50 MG 24 hr tablet TAKE 1 AND 1/2 TABLETS DAILY FOR 1 WEEK, THEN INCREASE TO 2 TABLETS DAILY AS DIRECTED (DOSE CHANGE)  . omeprazole (PRILOSEC) 40 MG capsule Take 1 capsule (40 mg total) by mouth as directed. Take 40 mg 30 minutes before breakfast for 2 weeks. Then take as needed.  . potassium chloride (K-DUR,KLOR-CON) 10 MEQ tablet Take 1 tablet (10 mEq total) by mouth daily as needed (Take with lasix as needed for swelling).  [name] Kitchen spironolactone (ALDACTONE) 25 MG tablet Take 1 tablet (25 mg total) by mouth every morning.  . triamcinolone (KENALOG) 0.025 % cream Apply 1 application topically 2 (two) times daily.  . valsartan (DIOVAN) 40 MG tablet TAKE 1 TABLET EVERY DAY (DISCONTINUE DIOVAN 80 MG)  .  [DISCONTINUED] cholestyramine (QUESTRAN) 4 GM/DOSE powder Take 4 g by mouth daily.   . [DISCONTINUED] gabapentin (NEURONTIN) 100 MG capsule Take 100 mg by mouth 3 times/day as needed-between meals & bedtime (pain).   . [DISCONTINUED] mometasone-formoterol (DULERA) 200-5 MCG/ACT AERO Inhale 2 puffs into the lungs 2 (two) times daily.  . [DISCONTINUED] mometasone-formoterol (DULERA) 200-5 MCG/ACT AERO Inhale 2 puffs into the lungs 2 (two) times daily.  . [DISCONTINUED] predniSONE (DELTASONE) 20 MG tablet Two daily with food  . [DISCONTINUED] omeprazole (PRILOSEC) 40 MG capsule Take 1 capsule (40 mg total) by mouth daily.   No facility-administered encounter  medications on file as of 12/30/2016.     PAST MEDICAL HISTORY:   Past Medical History:  Diagnosis Date  . Asthma   . Chronic systolic CHF (congestive heart failure) ([place])    a. cMRI 4/15: EF 34% and findings - c/w NICM, normal RV size and function (RVEF 61%), Mild MR // b. Echo 2/15:  EF 30-35%, diff HK, ant-sept AK, Gr 2 DD, mild MR, trivial TR  //  c. Echo 5/17: EF 20-25%, severe diffuse HK, marked systolic dyssynchrony, grade 1 diastolic dysfunction, mild MR  //  d. RHC 5/17: Fick CO 2.9, RVSP 19, PASP 15, PW mean 2, low filing pressures and preserved CO   . Diabetes mellitus   . Gastritis   . History of nuclear stress test    Myoview 5/18: EF 49, no ischemia, inferoseptal defect c/w LBBB artifact (intermediate risk due to EF < 50).  [name] Kitchen HTN (hypertension)   . Hyperlipidemia   . NICM (nonischemic cardiomyopathy) ([place])    a. Nuclear 5/13: Normal stress nuclear study. LV Ejection Fraction: 58%  //  b. LHC 10/14: Minor luminal irregularity in prox LAD, EF 35%   . Parkinson disease ([place])   . Plantar fasciitis   . Sleep apnea    was retested and no longer had it and so d/c CPAP  . Urticaria     PAST SURGICAL HISTORY:   Past Surgical History:  Procedure Laterality Date  . BUNIONECTOMY    . CARDIAC CATHETERIZATION N/A 12/16/2015    Procedure: Right Heart Cath;  Surgeon: Sherren Mocha, MD;  Location: [facility];  Service: Cardiovascular;  Laterality: N/A;  . CHOLECYSTECTOMY    . IMPLANTABLE CARDIOVERTER DEFIBRILLATOR IMPLANT  11-25-13   MDT dual chamber ICD implanted by [name] for primary prevention  . IMPLANTABLE CARDIOVERTER DEFIBRILLATOR IMPLANT N/A 11/25/2013   Procedure: IMPLANTABLE CARDIOVERTER DEFIBRILLATOR IMPLANT;  Surgeon: [name], MD;  Location: [facility] CATH LAB;  Service: Cardiovascular;  Laterality: N/A;  . LEFT AND RIGHT HEART CATHETERIZATION WITH CORONARY ANGIOGRAM N/A 05/31/2013   Procedure: LEFT AND RIGHT HEART CATHETERIZATION WITH CORONARY ANGIOGRAM;  Surgeon: [name], MD;  Location: [facility] CATH LAB;  Service: Cardiovascular;  Laterality: N/A;  . TONSILLECTOMY    . TUBAL LIGATION      SOCIAL HISTORY:   Social History   Social History  . Marital status: Married    Spouse name: N/A  . Number of children: 2  . Years of education: N/A   Occupational History  . retired     Quarry manager   Social History Main Topics  . Smoking status: Never Smoker  . Smokeless tobacco: Never Used     Comment: exposed to smoke during child hood (parents)  . Alcohol use No  . Drug use: No  . Sexual activity: Yes    Birth control/ protection: Surgical, Post-menopausal   Other Topics Concern  . Not on file   Social History Narrative   Lives at home with husband and the dog.    FAMILY HISTORY:   Family Status  Relation Status  . Father Deceased       MI, heart disease  . Mother Deceased       MI, stroke, DM  . MGM Deceased  . MGF Deceased  . PGM Deceased  . PGF Deceased  . Sister Alive        heart disease x3  . Son Alive       DM  . Daughter Alive  healthy  . Sister Alive  . Sister Alive    ROS:   Has chronic SOB with asthma and GERD.   A complete 10 system review of systems was obtained and was unremarkable apart from what is mentioned above.  PHYSICAL EXAMINATION:    VITALS:    Vitals:   12/30/16 0822  BP: 120/78  Pulse: 78  SpO2: 96%  Weight: 141 lb (64 kg)  Height: 5\' 2"  (1.575 m)    GEN:  The patient appears stated age and is in NAD. HEENT:  Normocephalic, atraumatic.  The mucous membranes are moist. The superficial temporal arteries are without ropiness or tenderness. CV:  RRR Lungs:  CTAB Neck/HEME:  There are no carotid bruits bilaterally.  Neurological examination:  Orientation: alert and oriented x 3.  Montreal Cognitive Assessment  10/19/2015  Visuospatial/ Executive (0/5) 4  Naming (0/3) 3  Attention: Read list of digits (0/2) 2  Attention: Read list of letters (0/1) 1  Attention: Serial 7 subtraction starting at 100 (0/3) 0  Language: Repeat phrase (0/2) 2  Language : Fluency (0/1) 0  Abstraction (0/2) 2  Delayed Recall (0/5) 0  Orientation (0/6) 6  Total 20  Adjusted Score (based on education) 21   Cranial nerves: There is good facial symmetry.  The visual fields are full to confrontational testing. The speech is fluent and clear. Soft palate rises symmetrically and there is no tongue deviation. Hearing is intact to conversational tone. Sensation: Sensation is intact to light touch throughout Motor: Strength is 5/5 in the bilateral upper and lower extremities.   Shoulder shrug is equal and symmetric.  There is no pronator drift.   Movement examination: Tone: There is mildly increased tone in the RUE and RLE.  Tone on the L is good.   Abnormal movements: There is no tremor Coordination:  There is good RAMs today Gait and Station: The patient has mild difficulty arising out of a deep-seated chair without the use of the hands but she is able to do this. She has mild pisa syndrome.    ASSESSMENT/PLAN:  1.  Idiopathic Parkinson's disease.  The patient has tremor, bradykinesia, rigidity and mild postural instability.  Dx was made in 03/2013 at [facility].    -previously given mirapex but patient d/c it.  talked to her again about this  especially given the fact that she has had several falls but she refuses.  Understands consequences of this decision.    -has been on levodopa in past when at [facility] but only took bid and didn't think helpful and d/c.  Is off now.  -talked about importance of exercising.   2.  Lumbar spinal stenosis  -She thought that she didn't have PD but that this was her only problem.  This certainly could be contributing but isn't her only issue.  She has d/c her gabapentin 3.  R shoulder pain  -told me today that she thought that her sciatica also travelled up her arm.  I told her that this wasn't possible and told her it may be her rotator cuff.  She doesn't want PT or ortho referral right now. 4.  Will see her in 6 months.  Much greater than 50% of this visit was spent in counseling and coordinating care.  Total face to face time:  20 min

## 2016-12-29 ENCOUNTER — Ambulatory Visit (HOSPITAL_COMMUNITY): Payer: Medicare Other | Attending: Internal Medicine

## 2016-12-29 ENCOUNTER — Telehealth: Payer: Self-pay | Admitting: *Deleted

## 2016-12-29 ENCOUNTER — Ambulatory Visit: Payer: Medicare Other | Admitting: Internal Medicine

## 2016-12-29 ENCOUNTER — Encounter: Payer: Self-pay | Admitting: Physician Assistant

## 2016-12-29 DIAGNOSIS — R0789 Other chest pain: Secondary | ICD-10-CM | POA: Diagnosis not present

## 2016-12-29 DIAGNOSIS — I10 Essential (primary) hypertension: Secondary | ICD-10-CM | POA: Diagnosis not present

## 2016-12-29 DIAGNOSIS — E785 Hyperlipidemia, unspecified: Secondary | ICD-10-CM | POA: Insufficient documentation

## 2016-12-29 DIAGNOSIS — I447 Left bundle-branch block, unspecified: Secondary | ICD-10-CM | POA: Diagnosis not present

## 2016-12-29 DIAGNOSIS — E119 Type 2 diabetes mellitus without complications: Secondary | ICD-10-CM | POA: Insufficient documentation

## 2016-12-29 LAB — BASIC METABOLIC PANEL
BUN/Creatinine Ratio: 19 (ref 12–28)
BUN: 20 mg/dL (ref 8–27)
CO2: 23 mmol/L (ref 18–29)
Calcium: 10.8 mg/dL — ABNORMAL HIGH (ref 8.7–10.3)
Chloride: 102 mmol/L (ref 96–106)
Creatinine, Ser: 1.05 mg/dL — ABNORMAL HIGH (ref 0.57–1.00)
GFR calc Af Amer: 64 mL/min/{1.73_m2} (ref >59)
GFR calc non Af Amer: 55 mL/min/{1.73_m2} — ABNORMAL LOW (ref >59)
Glucose: 100 mg/dL — ABNORMAL HIGH (ref 65–99)
Potassium: 4.2 mmol/L (ref 3.5–5.2)
Sodium: 143 mmol/L (ref 134–144)

## 2016-12-29 LAB — PRO B NATRIURETIC PEPTIDE: NT-Pro BNP: 244 pg/mL (ref 0–301)

## 2016-12-29 LAB — MYOCARDIAL PERFUSION IMAGING
LV dias vol: 97 mL (ref 46–106)
LV sys vol: 49 mL
Peak HR: 104 {beats}/min
RATE: 0.26
Rest HR: 65 {beats}/min
SDS: 6
SRS: 8
SSS: 14
TID: 0.99

## 2016-12-29 MED ORDER — REGADENOSON 0.4 MG/5ML IV SOLN
0.4000 mg | Freq: Once | INTRAVENOUS | Status: AC
  Administered 2016-12-29: 0.4 mg via INTRAVENOUS

## 2016-12-29 MED ORDER — TECHNETIUM TC 99M TETROFOSMIN IV KIT
10.1000 | PACK | Freq: Once | INTRAVENOUS | Status: AC | PRN
  Administered 2016-12-29: 10.1 via INTRAVENOUS
  Filled 2016-12-29: qty 11

## 2016-12-29 MED ORDER — TECHNETIUM TC 99M TETROFOSMIN IV KIT
32.8000 | PACK | Freq: Once | INTRAVENOUS | Status: AC | PRN
  Administered 2016-12-29: 32.8 via INTRAVENOUS
  Filled 2016-12-29: qty 33

## 2016-12-29 NOTE — Telephone Encounter (Signed)
-----   Message from Liliane Shi, [place] sent at 12/29/2016  8:47 AM EDT ----- Please call the patient Kidney function is stable. The Calcium is elevated. BNP is normal. Continue with current treatment plan. Please follow up with PCP for high Calcium >> send labs to PCP.  Richardson Dopp, PA-C   12/29/2016 8:47 AM

## 2016-12-29 NOTE — Telephone Encounter (Signed)
Pt has been notified of Myoview results by phone with verbal understanding. Pt was glad to hear Myoview looked good. Pt aware to keep echo appt 01/11/17 to further evaluate EF. Pt thanked me for the call today. I will fax results to PCP.

## 2016-12-29 NOTE — Telephone Encounter (Signed)
-----   Message from Liliane Shi, [place] sent at 12/29/2016  5:24 PM EDT ----- Please call the patient. The stress test is ok with no ischemia (loss of blood flow from a blockage).  The ejection fraction is 49% which is better than previous (but an echocardiogram is pending). Continue current treatment plan.  Please fax a copy of this study result to her PCP:  Rogue Bussing, MD  Thanks! Richardson Dopp, PA-C    12/29/2016 5:22 PM

## 2016-12-29 NOTE — Telephone Encounter (Signed)
Pt notified of lab results and findings by phone with verbal understanding. Pt advised to f/u with PCP in regards to elevated Calcium level. Pt agreeable to plan of care. I will fax results to PCP. Pt thanked me for my call today.

## 2016-12-30 ENCOUNTER — Encounter: Payer: Self-pay | Admitting: Neurology

## 2016-12-30 ENCOUNTER — Ambulatory Visit (INDEPENDENT_AMBULATORY_CARE_PROVIDER_SITE_OTHER): Payer: Medicare Other | Admitting: Neurology

## 2016-12-30 VITALS — BP 120/78 | HR 78 | Ht 62.0 in | Wt 141.0 lb

## 2016-12-30 DIAGNOSIS — G20 Parkinson's disease: Secondary | ICD-10-CM | POA: Diagnosis not present

## 2016-12-30 DIAGNOSIS — M25511 Pain in right shoulder: Secondary | ICD-10-CM | POA: Diagnosis not present

## 2017-01-04 DIAGNOSIS — R42 Dizziness and giddiness: Secondary | ICD-10-CM | POA: Diagnosis not present

## 2017-01-04 DIAGNOSIS — H6983 Other specified disorders of Eustachian tube, bilateral: Secondary | ICD-10-CM | POA: Diagnosis not present

## 2017-01-04 DIAGNOSIS — H903 Sensorineural hearing loss, bilateral: Secondary | ICD-10-CM

## 2017-01-04 DIAGNOSIS — H9313 Tinnitus, bilateral: Secondary | ICD-10-CM | POA: Diagnosis not present

## 2017-01-04 HISTORY — DX: Sensorineural hearing loss, bilateral: H90.3

## 2017-01-04 HISTORY — DX: Tinnitus, bilateral: H93.13

## 2017-01-11 ENCOUNTER — Other Ambulatory Visit: Payer: Self-pay

## 2017-01-11 ENCOUNTER — Encounter (HOSPITAL_COMMUNITY): Payer: Medicare Other

## 2017-01-11 ENCOUNTER — Telehealth: Payer: Self-pay | Admitting: *Deleted

## 2017-01-11 ENCOUNTER — Encounter: Payer: Self-pay | Admitting: Physician Assistant

## 2017-01-11 ENCOUNTER — Ambulatory Visit (HOSPITAL_COMMUNITY): Payer: Medicare Other | Attending: Cardiology

## 2017-01-11 DIAGNOSIS — I429 Cardiomyopathy, unspecified: Secondary | ICD-10-CM | POA: Diagnosis not present

## 2017-01-11 DIAGNOSIS — Z8249 Family history of ischemic heart disease and other diseases of the circulatory system: Secondary | ICD-10-CM | POA: Diagnosis not present

## 2017-01-11 DIAGNOSIS — G4733 Obstructive sleep apnea (adult) (pediatric): Secondary | ICD-10-CM | POA: Insufficient documentation

## 2017-01-11 DIAGNOSIS — I5022 Chronic systolic (congestive) heart failure: Secondary | ICD-10-CM | POA: Insufficient documentation

## 2017-01-11 DIAGNOSIS — E119 Type 2 diabetes mellitus without complications: Secondary | ICD-10-CM | POA: Insufficient documentation

## 2017-01-11 DIAGNOSIS — R0602 Shortness of breath: Secondary | ICD-10-CM | POA: Insufficient documentation

## 2017-01-11 DIAGNOSIS — J45909 Unspecified asthma, uncomplicated: Secondary | ICD-10-CM | POA: Insufficient documentation

## 2017-01-11 DIAGNOSIS — J4530 Mild persistent asthma, uncomplicated: Secondary | ICD-10-CM

## 2017-01-11 DIAGNOSIS — I110 Hypertensive heart disease with heart failure: Secondary | ICD-10-CM | POA: Insufficient documentation

## 2017-01-11 DIAGNOSIS — E785 Hyperlipidemia, unspecified: Secondary | ICD-10-CM | POA: Diagnosis not present

## 2017-01-11 DIAGNOSIS — I081 Rheumatic disorders of both mitral and tricuspid valves: Secondary | ICD-10-CM | POA: Insufficient documentation

## 2017-01-11 DIAGNOSIS — R0789 Other chest pain: Secondary | ICD-10-CM | POA: Diagnosis not present

## 2017-01-11 DIAGNOSIS — R0600 Dyspnea, unspecified: Secondary | ICD-10-CM

## 2017-01-11 NOTE — Telephone Encounter (Signed)
-----   Message from Liliane Shi, [place] sent at 01/11/2017  4:02 PM EDT ----- Please call the patient. Her echocardiogram demonstrates that her ejection fraction is somewhat better than last year (2017 it was 20-25; now it is 30-35).  Otherwise, there are no significant changes. I think she needs to follow up with Pulmonology for her asthma.  If she needs [place] to call to make a new referral that is ok.  Please fax a copy of this study result to her PCP:  Rogue Bussing, MD  Thanks! Richardson Dopp, PA-C    01/11/2017 3:59 PM

## 2017-01-11 NOTE — Telephone Encounter (Signed)
Ptcb and has been notified of echo results/findings by phone with verbal understanding. Pt advised to f/u with Pulmonology again for her asthma. Pt said she did last year and she did not like the doctor she was seeing, pt states he was yelling at her. I asked pt if she would go back to Pulmonology if I put in referral for different doctor, pt said yes. I will fax a copy of results to PCP as well. Pt thanked me for my call today.

## 2017-01-12 ENCOUNTER — Other Ambulatory Visit: Payer: Self-pay | Admitting: Cardiovascular Disease

## 2017-01-12 ENCOUNTER — Other Ambulatory Visit: Payer: Self-pay | Admitting: Internal Medicine

## 2017-01-12 ENCOUNTER — Encounter: Payer: Self-pay | Admitting: Pulmonary Disease

## 2017-01-16 ENCOUNTER — Ambulatory Visit (INDEPENDENT_AMBULATORY_CARE_PROVIDER_SITE_OTHER): Payer: Medicare Other

## 2017-01-16 ENCOUNTER — Telehealth: Payer: Self-pay

## 2017-01-16 DIAGNOSIS — I5022 Chronic systolic (congestive) heart failure: Secondary | ICD-10-CM

## 2017-01-16 DIAGNOSIS — Z95810 Presence of automatic (implantable) cardiac defibrillator: Secondary | ICD-10-CM

## 2017-01-16 NOTE — Telephone Encounter (Signed)
Remote ICM transmission received.  Attempted patient call and left message to return call.   

## 2017-01-16 NOTE — Progress Notes (Signed)
EPIC Encounter for ICM Monitoring  Patient Name: Veronica Shaw is a 66 y.o. female Date: 01/16/2017 Primary Care Physican: Rogue Bussing, MD Primary Cardiologist:Cooper/Weaver, PA Electrophysiologist: Druscilla Brownie Weight:unknown       Attempted call to patient x2 and unable to reach.  Left message to return call.  Transmission reviewed.    Thoracic impedance abnormal suggesting fluid accumulation trending just below baseline.  Patient is taking Furosemide and Potassium every other day instead of as needed.   Labs: 12/28/2016 Creatinine 1.05, BUN 20, Potassium 4.2, Sodium 143, EGFR 55-64 12/21/2016 Creatinine 1.33, BUN 24, Potassium 5.1, Sodium 140 07/21/2016 Creatinine 1.18, BUN 17, Potassium 4.1, Sodium 139, EGFR 48-55  Recommendations: NONE - Unable to reach patient   Follow-up plan: ICM clinic phone appointment on 01/30/2017 to recheck fluid levels.  Office appointment scheduled on 01/18/2017 with Richardson Dopp, PA.  Copy of ICM check sent to PA, primary cardiologist and device physician.   3 month ICM trend: 01/16/2017   1 Year ICM trend:      [name], RN 01/16/2017 11:41 AM

## 2017-01-17 NOTE — Progress Notes (Signed)
Increase Lasix and potassium to Once daily for 3 doses, then resume usual dose. BMET 1 week. FYI - she does not have an appointment with me until August.. Agree with repeat ICM check 01/30/17. Richardson Dopp, PA-C    01/17/2017 4:55 PM

## 2017-01-17 NOTE — Progress Notes (Signed)
Attempted call to patient to advise patient of Trinidad Curet recommendation but no answer.

## 2017-01-18 ENCOUNTER — Ambulatory Visit (INDEPENDENT_AMBULATORY_CARE_PROVIDER_SITE_OTHER): Payer: Medicare Other | Admitting: Internal Medicine

## 2017-01-18 ENCOUNTER — Ambulatory Visit: Payer: Medicare Other | Admitting: Physician Assistant

## 2017-01-18 ENCOUNTER — Encounter: Payer: Self-pay | Admitting: Internal Medicine

## 2017-01-18 VITALS — BP 120/65 | HR 75 | Temp 98.4°F | Ht 62.0 in | Wt 140.6 lb

## 2017-01-18 DIAGNOSIS — R2689 Other abnormalities of gait and mobility: Secondary | ICD-10-CM

## 2017-01-18 DIAGNOSIS — E118 Type 2 diabetes mellitus with unspecified complications: Secondary | ICD-10-CM

## 2017-01-18 DIAGNOSIS — R0602 Shortness of breath: Secondary | ICD-10-CM | POA: Diagnosis not present

## 2017-01-18 DIAGNOSIS — R29898 Other symptoms and signs involving the musculoskeletal system: Secondary | ICD-10-CM

## 2017-01-18 NOTE — Patient Instructions (Signed)
[name],  For your shortness of breath, try the breo inhaler once daily along with albuterol as needed. If you like this, I can prescribe this until you see Pulmonology.  For balance issues, I recommend contacting Dr. Carles Collet to try trial of medication. I will also refer you to Physical Therapy. You should get a call about making a Physical Therapy appointment within a week.  Otherwise, I would like to follow-up your electrolytes and kidney function regularly--about every 3 months. I placed a future lab order. If you have this done with another doctor, you don't have to have it done at the [facility], but I would recommend having this done around 8/30.  Please follow-up with me in about 5 months to recheck blood sugar, or sooner as needed.  Biotin 30 mcg is an over-the-counter supplement you might want to try for hair growth.  Best, Dr. Ola Spurr

## 2017-01-18 NOTE — Progress Notes (Signed)
[facility] Progress Note  Subjective:  Veronica Shaw is a 66 y.o. female with history of Idiopathic Parkinson's disease, T2DM, mild persistent asthma, and systolic heart failure, and nonischemic cardiomyopathy s/p s/p AICD 10/2013 who presents for annual check-up.   Concerns today include increased weakness and trouble with balance. She also continues to have shortness of breath.  Balance issues and weakness: Does feel dizzy at times when stands up. Denies sensation of room spinning. Most recently saw her Neurologist Dr. Carles Collet 12/30/16 who recommended taking mirapex, but patient refused at the time. Concerned about trying medication because her sister has Parkinson's and developed dyskinesia after about 6-7 years on medication, and patient has previously been skeptical of her own diagnosis of Parkinson's. No recent falls and is using a cane.   Shortness of breath:  Improves with dulera. She uses a sample she had prn and finds she needs to use her albuterol inhaler less when taken. She is almost out of dulera. Not worsened by any changes in position but does get short-winded with exercise. Taking lasix as needed but not for last couple of weeks. Has not noticed swelling of legs. To see Pulmonology 03/01/17.   T2DM: Takes metformin 500 mg BID. Stopped gabapentin though has decreased sensation in toes.   Gyn concerns/Preventative healthcare  Last menstrual period: Patient's last menstrual period was 08/01/2000 (approximate).  Sexually active: no  Last mammogram: 02/26/16  Breast mass or concerns: No  Foot exam: Sensation intact with monofilament testing but decreased over bilateral big toes.  Says had TDAP vaccine last year, though not updated in our records.  Has appointment for Ophthalmology check-up.    PMH, Surgical Hx, Family Hx, Social History reviewed.  Objective: Blood pressure 120/65, pulse 75, temperature 98.4 F (36.9 C), temperature source Oral, height 5\' 2"   (1.575 m), weight 140 lb 9.6 oz (63.8 kg), last menstrual period 08/01/2000, SpO2 95 %. Body mass index is 25.72 kg/m. Constitutional: Well-appearing female, in NAD Eyes: Bilateral cataracts.  HENT: MMM, normal posterior oropharynx, TMs normal bilaterally Cardiovascular: RRR, S1, S2, no m/r/g.  Pulmonary/Chest: Effort normal and breath sounds normal. No respiratory distress.  Abdominal: Soft. +BS, NT, ND  Musculoskeletal: Somewhat increased tone R>L (previously noted by patient's Neurologist).  Neurological: AOx3, no focal deficits. Strength 5/5 of both upper and extremities except for with hip abduction (4+/5 on L compared to 5/5 on R). Somewhat unsteady getting up from seated position. Shuffling gait.  Skin: Skin is warm and dry. No rash noted.  Psychiatric: Normal mood and affect.  Vitals reviewed     Chemistry      Component Value Date/Time   NA 143 12/28/2016 1520   K 4.2 12/28/2016 1520   CL 102 12/28/2016 1520   CO2 23 12/28/2016 1520   BUN 20 12/28/2016 1520   CREATININE 1.05 (H) 12/28/2016 1520   CREATININE 1.18 (H) 07/21/2016 0912      Component Value Date/Time   CALCIUM 10.8 (H) 12/28/2016 1520   ALKPHOS 83 07/21/2016 0912   AST 21 07/21/2016 0912   ALT 12 07/21/2016 0912   BILITOT 0.5 07/21/2016 0912      Lab Results  Component Value Date   WBC 6.4 12/22/2016   HGB 13.4 12/22/2016   HCT 40.3 12/22/2016   MCV 89 12/22/2016   PLT 233 12/22/2016   Lab Results  Component Value Date   TSH 2.18 07/21/2016   Lab Results  Component Value Date   HGBA1C 6.8 12/22/2016  Assessment/Plan: Balance problem - Suspect patient's Parkinson's disease is progressing to point that she is now noticing symptoms. - After discussing pros and cons, patient plans to call her Neurologist to trial treatment with closer follow-up. - Placed PT referral to help patient with strength training - Encouraged continued participation in group exercise classes.  Dyspnea -  Differential includes inadequately treated asthma and CHF exacerbation. Patient does not appear fluid-overloaded on exam. She notes improvement when taking Dulera. - Encouraged keeping appointment with Pulmonology in August or being seen sooner if worsening - Gave sample of breo ellipta 200/25, as do not currently have samples of dulera. Instructed patient how to use this.  Type II diabetes mellitus with complication - Well controlled on metformin 500 mg BID with last hgb A1c of 6.8 in May - Last GFR was 48. Recommend close monitoring of SCr every 3-6 months. If worsening, will refer to Nephrology.   - Recommended trying 30 mcg of biotin daily for hair loss.  Follow-up for BMP by at least end of August (future lab order placed) and in about 5 months for diabetes.  Olene Floss, MD [place], PGY-2

## 2017-01-19 MED ORDER — FLUTICASONE FUROATE-VILANTEROL 200-25 MCG/INH IN AEPB: 1.0000 | INHALATION_SPRAY | Freq: Every day | RESPIRATORY_TRACT | 0 refills | Status: DC

## 2017-01-19 NOTE — Assessment & Plan Note (Signed)
-   Well controlled on metformin 500 mg BID with last hgb A1c of 6.8 in May - Last GFR was 48. Recommend close monitoring of SCr every 3-6 months. If worsening, will refer to Nephrology.

## 2017-01-19 NOTE — Addendum Note (Signed)
Addended by: [name] on: 01/19/2017 10:25 AM   Modules accepted: Orders

## 2017-01-19 NOTE — Assessment & Plan Note (Signed)
-   Differential includes inadequately treated asthma and CHF exacerbation. Patient does not appear fluid-overloaded on exam. She notes improvement when taking Dulera. - Encouraged keeping appointment with Pulmonology in August or being seen sooner if worsening - Gave sample of breo ellipta 200/25, as do not currently have samples of dulera. Instructed patient how to use this.

## 2017-01-19 NOTE — Progress Notes (Signed)
Attempted call to patient to advise patient of Veronica Shaw recommendation but no answer.

## 2017-01-19 NOTE — Progress Notes (Signed)
Call to patient and advised of Trinidad Curet PA recommendation.  Advised to increase Lasix and potassium to once daily for 3 doses, then resume usual dose. BMET 1 week and she requested date of 01/25/2017 since she will not have her grandchildren that day She stated she did have an appointment on 01/18/2017 with Richardson Dopp but she canceled it because she had another appointment close to the same time. Appointment rescheduled for 03/08/2017.  Repeat ICM check 01/30/17.  She agreed to recommendations and verbalized understanding.

## 2017-01-19 NOTE — Assessment & Plan Note (Signed)
-   Suspect patient's Parkinson's disease is progressing to point that she is now noticing symptoms. - After discussing pros and cons, patient plans to call her Neurologist to trial treatment with closer follow-up. - Placed PT referral to help patient with strength training - Encouraged continued participation in group exercise classes.

## 2017-01-25 ENCOUNTER — Other Ambulatory Visit: Payer: Medicare Other | Admitting: *Deleted

## 2017-01-25 DIAGNOSIS — H2513 Age-related nuclear cataract, bilateral: Secondary | ICD-10-CM | POA: Diagnosis not present

## 2017-01-25 DIAGNOSIS — H04123 Dry eye syndrome of bilateral lacrimal glands: Secondary | ICD-10-CM | POA: Diagnosis not present

## 2017-01-25 DIAGNOSIS — I5022 Chronic systolic (congestive) heart failure: Secondary | ICD-10-CM

## 2017-01-25 DIAGNOSIS — Z95810 Presence of automatic (implantable) cardiac defibrillator: Secondary | ICD-10-CM

## 2017-01-25 DIAGNOSIS — E113291 Type 2 diabetes mellitus with mild nonproliferative diabetic retinopathy without macular edema, right eye: Secondary | ICD-10-CM | POA: Diagnosis not present

## 2017-01-25 DIAGNOSIS — H5203 Hypermetropia, bilateral: Secondary | ICD-10-CM | POA: Diagnosis not present

## 2017-01-25 LAB — HM DIABETES EYE EXAM

## 2017-01-26 ENCOUNTER — Telehealth: Payer: Self-pay | Admitting: *Deleted

## 2017-01-26 LAB — BASIC METABOLIC PANEL
BUN/Creatinine Ratio: 19 (ref 12–28)
BUN: 22 mg/dL (ref 8–27)
CO2: 22 mmol/L (ref 20–29)
Calcium: 10.3 mg/dL (ref 8.7–10.3)
Chloride: 104 mmol/L (ref 96–106)
Creatinine, Ser: 1.17 mg/dL — ABNORMAL HIGH (ref 0.57–1.00)
GFR calc Af Amer: 56 mL/min/{1.73_m2} — ABNORMAL LOW (ref >59)
GFR calc non Af Amer: 48 mL/min/{1.73_m2} — ABNORMAL LOW (ref >59)
Glucose: 159 mg/dL — ABNORMAL HIGH (ref 65–99)
Potassium: 4.5 mmol/L (ref 3.5–5.2)
Sodium: 142 mmol/L (ref 134–144)

## 2017-01-26 NOTE — Telephone Encounter (Signed)
Lmtcb to go over lab results 

## 2017-01-26 NOTE — Telephone Encounter (Signed)
-----   Message from Liliane Shi, PA-C sent at 01/26/2017  8:00 AM EDT ----- Please call the patient Creatinine stable. Continue with current treatment plan. Richardson Dopp, PA-C   01/26/2017 8:00 AM

## 2017-01-27 NOTE — Telephone Encounter (Signed)
-----   Message from Liliane Shi, PA-C sent at 01/26/2017  8:00 AM EDT ----- Please call the patient Creatinine stable. Continue with current treatment plan. Richardson Dopp, PA-C   01/26/2017 8:00 AM

## 2017-01-27 NOTE — Telephone Encounter (Signed)
Lmtcb x 2

## 2017-01-27 NOTE — Telephone Encounter (Signed)
Follow Up:    Returning your call from yesterday,

## 2017-01-27 NOTE — Telephone Encounter (Signed)
Pt has been notified of lab results by phone with verbal understanding. Pt said ok to lmom on [phone] #, that no one else has access to this phone. Pt thanked me for my call and wanting to make sure ok to lmom.

## 2017-01-30 ENCOUNTER — Telehealth: Payer: Self-pay

## 2017-01-30 ENCOUNTER — Ambulatory Visit (INDEPENDENT_AMBULATORY_CARE_PROVIDER_SITE_OTHER): Payer: Medicare Other

## 2017-01-30 DIAGNOSIS — I5022 Chronic systolic (congestive) heart failure: Secondary | ICD-10-CM

## 2017-01-30 DIAGNOSIS — Z95810 Presence of automatic (implantable) cardiac defibrillator: Secondary | ICD-10-CM

## 2017-01-30 NOTE — Progress Notes (Signed)
EPIC Encounter for ICM Monitoring  Patient Name: NIKAYLA MADARIS is a 66 y.o. female Date: 01/30/2017 Primary Care Physican: Rogue Bussing, MD Primary Cardiologist:Cooper/Weaver, PA Electrophysiologist: Druscilla Brownie Weight:unknown          Attempted call to patient and unable to reach.  Left detailed message regarding transmission.  Transmission reviewed.    Thoracic impedance returned to normal after 3 days of increased Lasix as recommended by Richardson Dopp, PA on 01/16/2017.  Patient is taking Furosemide and Potassium every other day instead of as needed.   Labs: 01/25/2017 Creatinine 1.17, BUN 22, Potassium 4.5, Sodium 142, EGFR 48-56 12/28/2016 Creatinine 1.05, BUN 20, Potassium 4.2, Sodium 143, EGFR 55-64, NT-Pro BNP 244 12/21/2016 Creatinine 1.33, BUN 24, Potassium 5.1, Sodium 140 07/21/2016 Creatinine 1.18, BUN 17, Potassium 4.1, Sodium 139, EGFR 48-55  Recommendations: Left voice mail with ICM number and encouraged to call for fluid symptoms.  Follow-up plan: ICM clinic phone appointment on 02/16/2017.  Office appointment with Richardson Dopp, PA on 03/08/2017  Copy of ICM check sent to device physician.   3 month ICM trend: 01/30/2017   1 Year ICM trend:      [name], RN 01/30/2017 8:30 AM

## 2017-01-30 NOTE — Progress Notes (Signed)
Agree Richardson Dopp, PA-C    01/30/2017 5:16 PM

## 2017-01-30 NOTE — Telephone Encounter (Signed)
Remote ICM transmission received.  Attempted patient call and left detailed message regarding transmission and next ICM scheduled for 02/16/2017.  Advised to return call for any fluid symptoms or questions.

## 2017-02-03 ENCOUNTER — Encounter: Payer: Self-pay | Admitting: Internal Medicine

## 2017-02-10 ENCOUNTER — Ambulatory Visit: Payer: Medicare Other | Attending: Family Medicine | Admitting: Physical Therapy

## 2017-02-10 VITALS — BP 108/65 | HR 80

## 2017-02-10 DIAGNOSIS — R2689 Other abnormalities of gait and mobility: Secondary | ICD-10-CM | POA: Insufficient documentation

## 2017-02-10 DIAGNOSIS — R2681 Unsteadiness on feet: Secondary | ICD-10-CM | POA: Diagnosis not present

## 2017-02-10 DIAGNOSIS — R296 Repeated falls: Secondary | ICD-10-CM | POA: Diagnosis not present

## 2017-02-10 DIAGNOSIS — M6281 Muscle weakness (generalized): Secondary | ICD-10-CM

## 2017-02-10 DIAGNOSIS — R262 Difficulty in walking, not elsewhere classified: Secondary | ICD-10-CM | POA: Insufficient documentation

## 2017-02-10 NOTE — Patient Instructions (Signed)
Stand to Sit / Sit to Stand    To sit: Bend knees to lower self onto front edge of chair, then scoot back on seat. To stand: Reverse sequence by placing one foot forward, and scoot to front of seat. Use rocking motion to stand up.  Copyright  VHI. All rights reserved.

## 2017-02-11 NOTE — Therapy (Signed)
[facility] [address] [place], [place], [address] Phone: [phone]   Fax:  [phone]  Physical Therapy Evaluation  Patient Details  Name: Veronica Shaw MRN: [id number] Date of Birth: [birth year] Referring Provider: Rogue Bussing   Encounter Date: 02/10/2017      PT End of Session - 02/11/17 1027    Authorization Time Period Medicare g-code every 10th visit   PT Start Time 1015   PT Stop Time 1100   PT Time Calculation (min) 45 min   Activity Tolerance Patient tolerated treatment well;Patient limited by fatigue   Behavior During Therapy [facility] for tasks assessed/performed      Past Medical History:  Diagnosis Date  . Asthma   . Chronic systolic CHF (congestive heart failure) ([place])    a. cMRI 4/15: EF 34% and findings - c/w NICM, normal RV size and function (RVEF 61%), Mild MR // b. Echo 2/15:  EF 30-35%, diff HK, ant-sept AK, Gr 2 DD, mild MR, trivial TR  //  c. Echo 5/17: EF 20-25%, severe diffuse HK, marked systolic dyssynchrony, grade 1 diastolic dysfunction, mild MR  //  d. RHC 5/17: Fick CO 2.9, RVSP 19, PASP 15, PW mean 2, low filing pressures and preserved CO   . Diabetes mellitus   . Gastritis   . History of echocardiogram    Echo 6/18: EF 30-35, diffuse HK, grade 1 diastolic dysfunction, trivial MR, mild LAE, mild TR, no pericardial effusion  . History of nuclear stress test    Myoview 5/18: EF 49, no ischemia, inferoseptal defect c/w LBBB artifact (intermediate risk due to EF < 50).  [name] Kitchen HTN (hypertension)   . Hyperlipidemia   . NICM (nonischemic cardiomyopathy) ([place])    a. Nuclear 5/13: Normal stress nuclear study. LV Ejection Fraction: 58%  //  b. LHC 10/14: Minor luminal irregularity in prox LAD, EF 35%   . Parkinson disease ([place])   . Plantar fasciitis   . Sleep apnea    was retested and no longer had it and so d/c CPAP  . Urticaria     Past Surgical History:  Procedure Laterality Date   . BUNIONECTOMY    . CARDIAC CATHETERIZATION N/A 12/16/2015   Procedure: Right Heart Cath;  Surgeon: Sherren Mocha, MD;  Location: [facility];  Service: Cardiovascular;  Laterality: N/A;  . CHOLECYSTECTOMY    . IMPLANTABLE CARDIOVERTER DEFIBRILLATOR IMPLANT  11-25-13   MDT dual chamber ICD implanted by [name] for primary prevention  . IMPLANTABLE CARDIOVERTER DEFIBRILLATOR IMPLANT N/A 11/25/2013   Procedure: IMPLANTABLE CARDIOVERTER DEFIBRILLATOR IMPLANT;  Surgeon: [name], MD;  Location: [facility] C[facility] LAB;  Service: Cardiovascular;  Laterality: N/A;  . LEFT AND RIGHT HEART CATHETERIZATION WITH CORONARY ANGIOGRAM N/A 05/31/2013   Procedure: LEFT AND RIGHT HEART CATHETERIZATION WITH CORONARY ANGIOGRAM;  Surgeon: [name], MD;  Location: [facility] CATH LAB;  Service: Cardiovascular;  Laterality: N/A;  . TONSILLECTOMY    . TUBAL LIGATION      Vitals:   02/10/17 1026 02/10/17 1028  BP: (!) 105/59 108/65  Pulse: 74 80         Subjective Assessment - 02/10/17 1528    Subjective Ms Veronica Shaw was referred for Bilateral LE weakness; w/dx of parkinsons; she also has CHF and DM; she reports a long hx of lower back pain with sciatica; her main complaint today is lightheadedness with walking/standing; She walked in using a cane that was adjusted too high; we resolved this;  she also stated she gets leg cramps daily; she stated her leg symptoms ( sciatica) is on both sides and comes and goes and she feels she has similar senstation in her arms at times; She stated she has fallen several times; often when she bends over to retrieve an object;  She stated that she was dx with PD about 7 years ago ; she questions the dx; as she doens't have similar symptoms as her sister ( ie. tremors, giat deficits ) ; I did tell her that PD can presnet in many differnet ways; her activity is significantly limited by fatigue which she attirbutes to CAD/CHF;    Pertinent History PD, CHF,DM   Limitations  Standing;Walking   How long can you sit comfortably? 30 min   How long can you stand comfortably? 2-3 min   How long can you walk comfortably? 5 min   Patient Stated Goals stop leg cramps and falling; walk better   Currently in Pain? Yes   Pain Score 2    Pain Location Back   Pain Orientation Posterior;Lower   Pain Descriptors / Indicators Aching;Radiating   Pain Type Chronic pain   Pain Radiating Towards feet  R>L   Pain Onset More than a month ago   Pain Frequency Constant   Aggravating Factors  prolonged positions   Pain Relieving Factors rest                Objective measurements completed on examination: See above findings.                  PT Education - 02/11/17 1014    Education provided Yes   Education Details focus on home safety with transfers; control of descent with stand to sit and recommend amb wtih SPC at all times;    Person(s) Educated Patient   Methods Explanation;Handout   Comprehension Verbalized understanding;Returned demonstration          PT Short Term Goals - 02/11/17 1122      PT SHORT TERM GOAL #1   Title Pt will be able to tolerate 5 min of continuous activity in standing in order to safely manage self care activities   Baseline 1-2 min   Time 2   Period Weeks   Status New     PT SHORT TERM GOAL #2   Title Pt will be able to walk , level surface  500' with RPE < 3/10 in order to safely amb in home and short walks in community    Baseline 115' is max effort  RPE  5/10   Time 4   Period Weeks   Status New     PT SHORT TERM GOAL #3   Title Pt will demonstrate functional understanding of energy conservation techniques ADL's    Baseline pt is overwhelmed and focused on  poor endurance    Time 4   Period Weeks           PT Long Term Goals - 02/11/17 1128      PT LONG TERM GOAL #1   Title Pt will demonstrate low fall risk standing and transitional mvts evident with Merrilee Jansky >52    Baseline 48/56   Time 8   Period  Weeks   Status New     PT LONG TERM GOAL #2   Title Pt will be able to walk for 15 minutes w/o loss of balance or RPE >3/10 using LRAD   Baseline amb 115' with SPC- laterally varying  gait pattern   Time 8   Period Weeks     PT LONG TERM GOAL #3   Title Pt will be independent with a high level HEP (ie. LSVT exs) with focus on core strength and reciprocal mvt patterns to give her long term maintenance of functional mvt patters   Baseline minimal focus on HEP and poor understanding of PD   Time 8   Period Weeks                Plan - 2017/03/03 1029    Clinical Impression Statement Ms Veronica Shaw is a pleasant 66y/o woman with multi factorial problems that affect her daily function; main focus of PT referral is generalized weakness and balance/gait deficits; her hx of CHF, DM and Parkinsons all affect her functional status-as well as hx of low back pain with radiating symptoms; I did assess for s/s of lumbar disc pathology -standing lumbar flexion and extension repeated mvts did not affect her back pain or increase/decrease any radiculopathy; will cont to assess LE symptoms-consider extent of peripheral neuropathy associated with DM; Her main limiting factor she presented with today was very poor activty tolerance and a genralized weakness throughout; notalbly her core and LE at best was 3+ to 4- out of 5 MMT rating; She did manage to score a 48 on the Berg; however she had to take multi rest breaks and walking 115' was a maximal effort ; with fatigue her balance deteriorates and its my clinical judgement that, with fatigue , she is at a very high fall risk;  She did not exhibit some of the typical s/s of advancing PD ; she had not tremor, was able to initiate mvt well and had no change in gait velocity when moving through transitions ( door ways , etc) normal presentation of facial expression as well;  she will benefit from skilled PT to reduce her fall risk and improve her activty tolerance as well  as cont education on PD and get her doing the high level  LSVT    Clinical Presentation Evolving   Clinical Presentation due to: multi factorial co-morbities   Clinical Decision Making Moderate   Rehab Potential Good   Clinical Impairments Affecting Rehab Potential LBP, endurance, balance deficit   PT Frequency 2x / week   PT Duration 8 weeks   PT Treatment/Interventions Functional mobility training;Stair training;Gait training;Therapeutic activities;Therapeutic exercise;Balance training;Neuromuscular re-education;Patient/family education;Manual techniques;Energy conservation;Vestibular   PT Next Visit Plan Assess back pain and LE radiculopathy/sensation further; develop HEP;    PT Home Exercise Plan started with focus on controlling descent with stand to sit transfers   Consulted and Agree with Plan of Care Patient      Patient will benefit from skilled therapeutic intervention in order to improve the following deficits and impairments:  Decreased balance, Decreased endurance, Decreased mobility, Difficulty walking, Impaired sensation, Impaired perceived functional ability, Dizziness, Cardiopulmonary status limiting activity, Decreased activity tolerance, Decreased strength, Pain  Visit Diagnosis: Difficulty in walking, not elsewhere classified  Unsteadiness on feet  Muscle weakness (generalized)  Repeated falls  Other abnormalities of gait and mobility      G-Codes - 2017-03-03 1138    Functional Assessment Tool Used (Outpatient Only) Berg; clinical judgement; RPE   Functional Limitation Mobility: Walking and moving around   Mobility: Walking and Moving Around Current Status (P5093) At least 60 percent but less than 80 percent impaired, limited or restricted   Mobility: Walking and Moving Around Goal Status (O6712) At least 1 percent  but less than 20 percent impaired, limited or restricted       Problem List Patient Active Problem List   Diagnosis Date Noted  . Hair loss  07/21/2016  . Middle ear effusion, bilateral 04/28/2016  . Suprapubic pain 04/14/2016  . Allergic rhinitis 11/09/2015  . Upper airway cough syndrome 08/20/2015  . Osteopenia 07/10/2015  . Mild persistent asthma in adult without complication 18/56/3149  . Bunion, left foot 05/20/2014  . NICM (nonischemic cardiomyopathy) ([place]) 04/23/2014  . Arthritis or polyarthritis, rheumatoid ([place]) 03/12/2014  . ICD (implantable cardioverter-defibrillator), dual, in situ 03/06/2014  . Thoracic or lumbosacral neuritis or radiculitis, unspecified 07/03/2013  . Chronic systolic heart failure ([place]) 05/31/2013  . Parkinson disease ([place]) 05/21/2013  . 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 Major depression 03/31/2013  . Balance problem 03/03/2013  . Lower extremity edema 03/03/2013  . GERD (gastroesophageal reflux disease) 09/27/2012  . Blurry vision, bilateral 09/27/2012  . Dyspnea 09/19/2012  . Plantar fasciitis, bilateral 06/15/2012  . Cervical pain (neck) 04/12/2012  . 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 Diabetic peripheral neuropathy ([place]) 12/09/2011  . Hyperlipidemia 12/09/2011  . Sleep apnea 12/09/2011  . Fatigue 10/13/2011  . Chronic urticaria 05/27/2011  . 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 05/20/2011  . Sciatic leg pain 07/22/2009  . ROSACEA 05/29/2009  . ACHILLES BURSITIS OR TENDINITIS 03/27/2009  . Type II diabetes mellitus with complication ([place]) 70/26/3785  . Essential hypertension 09/28/2006    Rosaura Carpenter D PT DPT 02/11/2017, 12:13 PM  [facility] [address] [place], [place], [address] Phone: [phone]   Fax:  [phone]  Name: JAMAE TISON MRN: [id number] Date of Birth: April 18, 1951

## 2017-02-15 ENCOUNTER — Ambulatory Visit: Payer: Medicare Other | Admitting: Physical Therapy

## 2017-02-15 ENCOUNTER — Encounter: Payer: Self-pay | Admitting: Physical Therapy

## 2017-02-15 DIAGNOSIS — R296 Repeated falls: Secondary | ICD-10-CM

## 2017-02-15 DIAGNOSIS — R262 Difficulty in walking, not elsewhere classified: Secondary | ICD-10-CM

## 2017-02-15 DIAGNOSIS — R2689 Other abnormalities of gait and mobility: Secondary | ICD-10-CM

## 2017-02-15 DIAGNOSIS — R2681 Unsteadiness on feet: Secondary | ICD-10-CM

## 2017-02-15 DIAGNOSIS — M6281 Muscle weakness (generalized): Secondary | ICD-10-CM | POA: Diagnosis not present

## 2017-02-15 NOTE — Therapy (Signed)
[facility] [address] [place], [place], [address] Phone: [phone]   Fax:  [phone]  Physical Therapy Treatment  Patient Details  Name: Veronica Shaw MRN: [id number] Date of Birth: 1951-05-12 Referring Provider: Rogue Bussing   Encounter Date: 02/15/2017      PT End of Session - 02/15/17 1148    Visit Number 2   Number of Visits 17   Date for PT Re-Evaluation 04/11/17   Authorization Time Period Medicare g-code every 10th visit   PT Start Time 1102   PT Stop Time 1146   PT Time Calculation (min) 44 min   Equipment Utilized During Treatment Gait belt   Activity Tolerance Patient tolerated treatment well   Behavior During Therapy [facility] for tasks assessed/performed      Past Medical History:  Diagnosis Date  . Asthma   . Chronic systolic CHF (congestive heart failure) ([place])    a. cMRI 4/15: EF 34% and findings - c/w NICM, normal RV size and function (RVEF 61%), Mild MR // b. Echo 2/15:  EF 30-35%, diff HK, ant-sept AK, Gr 2 DD, mild MR, trivial TR  //  c. Echo 5/17: EF 20-25%, severe diffuse HK, marked systolic dyssynchrony, grade 1 diastolic dysfunction, mild MR  //  d. RHC 5/17: Fick CO 2.9, RVSP 19, PASP 15, PW mean 2, low filing pressures and preserved CO   . Diabetes mellitus   . Gastritis   . History of echocardiogram    Echo 6/18: EF 30-35, diffuse HK, grade 1 diastolic dysfunction, trivial MR, mild LAE, mild TR, no pericardial effusion  . History of nuclear stress test    Myoview 5/18: EF 49, no ischemia, inferoseptal defect c/w LBBB artifact (intermediate risk due to EF < 50).  [name] Kitchen HTN (hypertension)   . Hyperlipidemia   . NICM (nonischemic cardiomyopathy) ([place])    a. Nuclear 5/13: Normal stress nuclear study. LV Ejection Fraction: 58%  //  b. LHC 10/14: Minor luminal irregularity in prox LAD, EF 35%   . Parkinson disease ([place])   . Plantar fasciitis   . Sleep apnea    was retested and no  longer had it and so d/c CPAP  . Urticaria     Past Surgical History:  Procedure Laterality Date  . BUNIONECTOMY    . CARDIAC CATHETERIZATION N/A 12/16/2015   Procedure: Right Heart Cath;  Surgeon: Sherren Mocha, MD;  Location: [facility];  Service: Cardiovascular;  Laterality: N/A;  . CHOLECYSTECTOMY    . IMPLANTABLE CARDIOVERTER DEFIBRILLATOR IMPLANT  11-25-13   MDT dual chamber ICD implanted by [name] for primary prevention  . IMPLANTABLE CARDIOVERTER DEFIBRILLATOR IMPLANT N/A 11/25/2013   Procedure: IMPLANTABLE CARDIOVERTER DEFIBRILLATOR IMPLANT;  Surgeon: [name], MD;  [facility] C[facility];  Service: Cardiovascular;  Laterality: N/A;  . LEFT AND RIGHT HEART CATHETERIZATION WITH CORONARY ANGIOGRAM N/A 05/31/2013   Procedure: LEFT AND RIGHT HEART CATHETERIZATION WITH CORONARY ANGIOGRAM;  Surgeon: [name], MD;  Location: [facility] CATH LAB;  Service: Cardiovascular;  Laterality: N/A;  . TONSILLECTOMY    . TUBAL LIGATION      There were no vitals filed for this visit.      Subjective Assessment - 02/15/17 1104    Subjective Patient reported no falls or significant pain. She has a chronic eye condition that has lasted for over a month. She had an allergic reaction to the medication perscribed so her eyes are irritated today and is affecting  her vision.    Currently in Pain? No/denies           [facility] Adult PT Treatment/Exercise - 02/15/17 1248      High Level Balance   High Level Balance Activities Side stepping;Marching forwards;Marching backwards  heel walking, toe walking    High Level Balance Comments 3 laps at counter each, intermittent UE support on counter with min guard, cues for posture and to look foward when completing tasks, she tended to drift her left leg posteriorly with lateral walking requring verbal cues to correct                                             Exercises   Exercises Lumbar;Knee/Hip     Lumbar Exercises: Stretches   Active  Hamstring Stretch 2 reps;30 seconds  each leg    Active Hamstring Stretch Limitations sitting EOB with leg extended in front, leaning foward until stretch felt in hamstrings    Single Knee to Chest Stretch 2 reps;30 seconds  each leg    Double Knee to Chest Stretch 3 reps;30 seconds   Double Knee to Chest Stretch Limitations used a towel around back of thigh to aid in pulling knees toward chest to feel the stretch in lower back     Lumbar Exercises: Seated   Other Seated Lumbar Exercises trunk forward leans, 10x sitting edge of mat with arms crossed over chest and feet shoulder width apart, leaning forward then slowly leaning back, cues to keep back straight and avoid hunching over when leaning forward      Lumbar Exercises: Supine   Dead Bug 5 reps  alternating each leg, 10 total reps    Dead Bug Limitations both knees lifted and bent at 90deg, slowly lowered one leg to mat keep knee bent, alternating legs, required verbal cues and demonstration to correctly perform exerise                           Bridge Compliant;10 reps;3 seconds   Bridge Limitations patient reminded to not hold her breath as she held the bridge    Other Supine Lumbar Exercises mini curl-ups one leg extended and the other knee bent with both hands under lower back, lifting elbows then curling head and neck gently to engage core, 10x, patinet complained of pressure in the back of her head as she completed exercise     Knee/Hip Exercises: Standing   Heel Raises 2 sets;10 reps;1 second   Heel Raises Limitations used counter for UE support    Functional Squat 1 set;10 reps;Other (comment)   Functional Squat Limitations used counter for UE support, mini squats      Knee/Hip Exercises: Seated   Sit to Sand 2 sets;5 reps;without UE support;Other (comment)  cues to lean forward and avoid using mat to help stand up              PT Education - 02/15/17 1147    Education provided Yes   Education Details Created HEP  and discussed exercises with patient.    Person(s) Educated Patient   Methods Explanation;Handout;Demonstration   Comprehension Verbalized understanding;Returned demonstration          PT Short Term Goals - 02/11/17 1122      PT SHORT TERM GOAL #1   Title Pt will be able to  tolerate 5 min of continuous activity in standing in order to safely manage self care activities   Baseline 1-2 min   Time 2   Period Weeks   Status New     PT SHORT TERM GOAL #2   Title Pt will be able to walk , level surface  500' with RPE < 3/10 in order to safely amb in home and short walks in community    Baseline 115' is max effort  RPE  5/10   Time 4   Period Weeks   Status New     PT SHORT TERM GOAL #3   Title Pt will demonstrate functional understanding of energy conservation techniques ADL's    Baseline pt is overwhelmed and focused on  poor endurance    Time 4   Period Weeks           PT Long Term Goals - 02/11/17 1128      PT LONG TERM GOAL #1   Title Pt will demonstrate low fall risk standing and transitional mvts evident with Merrilee Jansky >52    Baseline 48/56   Time 8   Period Weeks   Status New     PT LONG TERM GOAL #2   Title Pt will be able to walk for 15 minutes w/o loss of balance or RPE >3/10 using LRAD   Baseline amb 115' with SPC- laterally varying gait pattern   Time 8   Period Weeks     PT LONG TERM GOAL #3   Title Pt will be independent with a high level HEP (ie. LSVT exs) with focus on core strength and reciprocal mvt patterns to give her long term maintenance of functional mvt patters   Baseline minimal focus on HEP and poor understanding of PD   Time 8   Period Weeks               Plan - 02/15/17 1241    Clinical Impression Statement Today's session focused on creating an HEP to address patient's back pain, balance, and LE strengtheing. Patient tolerated treatment well today and completed all tasks with no complaint of increased pain. She had difficulty with  controlling descent  but was able to improve with verbal cues to reach back with UE and to lean forward slightly when sitting down. Patient would benefit from continued PT to address residual concerns and unmet goals.    Clinical Impairments Affecting Rehab Potential LBP, endurance, balance deficit   PT Frequency 2x / week   PT Duration 8 weeks   PT Treatment/Interventions Functional mobility training;Stair training;Gait training;Therapeutic activities;Therapeutic exercise;Balance training;Neuromuscular re-education;Patient/family education;Manual techniques;Energy conservation;Vestibular   PT Next Visit Plan Assess back pain and LE radiculopathy/sensation further; PWR movements and advance HEP as indicated      Patient will benefit from skilled therapeutic intervention in order to improve the following deficits and impairments:  Decreased balance, Decreased endurance, Decreased mobility, Difficulty walking, Impaired sensation, Impaired perceived functional ability, Dizziness, Cardiopulmonary status limiting activity, Decreased activity tolerance, Decreased strength, Pain  Visit Diagnosis: Difficulty in walking, not elsewhere classified  Unsteadiness on feet  Muscle weakness (generalized)  Repeated falls  Other abnormalities of gait and mobility     Problem List Patient Active Problem List   Diagnosis Date Noted  . Hair loss 07/21/2016  . Middle ear effusion, bilateral 04/28/2016  . Suprapubic pain 04/14/2016  . Allergic rhinitis 11/09/2015  . Upper airway cough syndrome 08/20/2015  . Osteopenia 07/10/2015  . Mild persistent asthma in adult  without complication 91/98/0221  . Bunion, left foot 05/20/2014  . NICM (nonischemic cardiomyopathy) ([place]) 04/23/2014  . Arthritis or polyarthritis, rheumatoid ([place]) 03/12/2014  . ICD (implantable cardioverter-defibrillator), dual, in situ 03/06/2014  . Thoracic or lumbosacral neuritis or radiculitis, unspecified 07/03/2013  . Chronic  systolic heart failure ([place]) 05/31/2013  . Parkinson disease ([place]) 05/21/2013  . 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 Major depression 03/31/2013  . Balance problem 03/03/2013  . Lower extremity edema 03/03/2013  . GERD (gastroesophageal reflux disease) 09/27/2012  . Blurry vision, bilateral 09/27/2012  . Dyspnea 09/19/2012  . Plantar fasciitis, bilateral 06/15/2012  . Cervical pain (neck) 04/12/2012  . 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 Diabetic peripheral neuropathy ([place]) 12/09/2011  . Hyperlipidemia 12/09/2011  . Sleep apnea 12/09/2011  . Fatigue 10/13/2011  . Chronic urticaria 05/27/2011  . 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 05/20/2011  . Sciatic leg pain 07/22/2009  . ROSACEA 05/29/2009  . ACHILLES BURSITIS OR TENDINITIS 03/27/2009  . Type II diabetes mellitus with complication ([place]) 79/81/0254  . Essential hypertension 09/28/2006    Skip Estimable, SPTA 02/15/2017, 1:49 PM  [facility] [address] [place], [place], [address] Phone: [phone]   Fax:  [phone]  Name: Veronica Shaw MRN: [id number] Date of Birth: 09-11-1950  This note has been reviewed and edited by supervising CI.  Willow Ora, PTA, [facility] [address], [place], [place] [address] [phone] 02/15/17, 11:38 PM

## 2017-02-15 NOTE — Patient Instructions (Addendum)
Knee to Chest    Lying supine, bend involved knee to chest and hold for 15 sec. Complete 3 times. Repeat with other leg. Do _2-3__ times per day.  Copyright  VHI. All rights reserved.  Double Knee to Chest (Flexion)    Gently pull both knees toward chest. Feel stretch in lower back or buttock area. Breathing deeply, Hold __15__ seconds. Repeat _3___ times. Do __2-3__ sessions per day.  http://gt2.exer.us/228   Copyright  VHI. All rights reserved.  Bridge    Lie on back, legs bent. Inhale, pressing hips up. Keeping ribs in, lengthen lower back.  Repeat _10___ times. Complete 1 set. Do __2-3__ sessions per day.  http://pm.exer.us/55   Copyright  VHI. All rights reserved.  Functional Quadriceps: Sit to Stand    Sit on edge of chair, feet flat on floor. Lean forward slightly and Stand upright, extending knees fully. Repeat __5__ times per set. Do _2_ sets per session. Do __2-3_ sessions per day.  http://orth.exer.us/735   Copyright  VHI. All rights reserved.     "I love a Database administrator   At counter for balance as needed: high knee marching forward and then backward. 3 second pauses with each knee lift.  Repeat 3 laps each way. Do _1-2_ sessions per day. http://gt2.exer.us/345   Copyright  VHI. All rights reserved.  Side-Stepping   At counter for balance as needed: Walk to left side with eyes open. Take even steps, leading with same foot. Make sure each foot lifts off the floor. Repeat in opposite direction. Keep feet pointed toward counter. Repeat for 3 laps each way.  Do _1-2__ sessions per day.  Copyright  VHI. All rights reserved.  Walking on Heels   At counter: Walk on heels forward while continuing on a straight path, and then walk on heels backward to starting position. Repeat for 3 laps each way. Do _1-2__ sessions per day.  Copyright  VHI. All rights reserved.  Walking on Toes   At counter for balance as needed: Walk on toes forward while  continuing on a straight path, and then backwards on toes to starting position. Repeat 3 laps each way. Do _1-2___ sessions per day.

## 2017-02-16 ENCOUNTER — Telehealth: Payer: Self-pay

## 2017-02-16 ENCOUNTER — Ambulatory Visit (INDEPENDENT_AMBULATORY_CARE_PROVIDER_SITE_OTHER): Payer: Medicare Other | Admitting: *Deleted

## 2017-02-16 DIAGNOSIS — I5022 Chronic systolic (congestive) heart failure: Secondary | ICD-10-CM

## 2017-02-16 DIAGNOSIS — Z95810 Presence of automatic (implantable) cardiac defibrillator: Secondary | ICD-10-CM

## 2017-02-16 DIAGNOSIS — I428 Other cardiomyopathies: Secondary | ICD-10-CM

## 2017-02-16 NOTE — Telephone Encounter (Signed)
ICM call to patient.  During call she reported she stopped taking Valsartan for the last 2 days after hearing the recall on television.  Asked if she had made Dr Burt Knack aware that she stopped taking the medication and she said no.    Pt c/o medication issue 1. Name of medication: Valsartan 2. How are you currently taking this medication: Valsartan 40 mg 1 tablet every day 3. Are you having a reaction? No 4. What is your medication issue? Being recalled and she is worried about cancer.    Advised triage nurse will discuss with physician and contact her with update.

## 2017-02-16 NOTE — Progress Notes (Signed)
Remote ICD transmission.   

## 2017-02-16 NOTE — Progress Notes (Addendum)
Patient returned call.  She stated she has not noticed any fluid symptoms.  Reviewed transmission and advised thoracic impedance is abnormal suggesting fluid accumulation since 02/11/2017.   Recommendation: Advised to take prn Furosemide 20 mg 1 tablet daily x 2 days with Potassium 10 meq 1 tablet x 2 days.  After 2nd day she can return to prn.   She said she has some dizziness in last 2 days when walking but unsure of the reason.  She reported BP has been normal.  She has not had any other symptoms with dizziness.   Advised to contact PCP office if dizziness continues.    Advised would send copy to physician and if any further recommendations will call her back.

## 2017-02-16 NOTE — Progress Notes (Signed)
EPIC Encounter for ICM Monitoring  Patient Name: Veronica Shaw is a 66 y.o. female Date: 02/16/2017 Primary Care Physican: Rogue Bussing, MD Primary Cardiologist:Cooper/Weaver, PA Electrophysiologist: Druscilla Brownie Weight:unknown      Attempted call to patient and unable to reach.  Left message to return call.  Transmission reviewed.    Thoracic impedance abnormal suggesting fluid accumulation since 02/11/2017.  Prescribed dosage: Furosemide 20 mg 1 tablet daily as needed.  Potassium 10 mEq  1 tablet daily as needed (taken with Lasix).   Labs: 01/25/2017 Creatinine 1.17, BUN 22, Potassium 4.5, Sodium 142, EGFR 48-56 12/28/2016 Creatinine 1.05, BUN 20, Potassium 4.2, Sodium 143, EGFR 55-64, NT-Pro BNP 244 12/21/2016 Creatinine 1.33, BUN 24, Potassium 5.1, Sodium 140 07/21/2016 Creatinine 1.18, BUN 17, Potassium 4.1, Sodium 139, EGFR 48-55  Recommendations: NONE - Unable to reach patient   Follow-up plan: ICM clinic phone appointment on 02/23/2017 to recheck fluid levels.  Office appointment scheduled 03/08/2017 with Richardson Dopp, PA.  Copy of ICM check sent to primary cardiologist and device physician.   3 month ICM trend: 02/16/2017   1 Year ICM trend:      [name], RN 02/16/2017 3:26 PM

## 2017-02-16 NOTE — Telephone Encounter (Signed)
Remote ICM transmission received.  Attempted patient call and left message to return call.   

## 2017-02-16 NOTE — Telephone Encounter (Signed)
Will route to [facility] for change in medication d/t Valsartan recall.

## 2017-02-17 ENCOUNTER — Ambulatory Visit: Payer: Medicare Other | Admitting: Physical Therapy

## 2017-02-17 DIAGNOSIS — R2681 Unsteadiness on feet: Secondary | ICD-10-CM | POA: Diagnosis not present

## 2017-02-17 DIAGNOSIS — R262 Difficulty in walking, not elsewhere classified: Secondary | ICD-10-CM

## 2017-02-17 DIAGNOSIS — M6281 Muscle weakness (generalized): Secondary | ICD-10-CM | POA: Diagnosis not present

## 2017-02-17 DIAGNOSIS — R2689 Other abnormalities of gait and mobility: Secondary | ICD-10-CM | POA: Diagnosis not present

## 2017-02-17 DIAGNOSIS — R296 Repeated falls: Secondary | ICD-10-CM | POA: Diagnosis not present

## 2017-02-17 LAB — CUP PACEART REMOTE DEVICE CHECK
Battery Remaining Longevity: 96 mo
Battery Voltage: 2.99 V
Brady Statistic AP VP Percent: 0 %
Brady Statistic AP VS Percent: 0.03 %
Brady Statistic AS VP Percent: 0.04 %
Brady Statistic AS VS Percent: 99.93 %
Brady Statistic RA Percent Paced: 0.03 %
Brady Statistic RV Percent Paced: 0.04 %
Date Time Interrogation Session: 20180719083826
HighPow Impedance: 69 Ohm
Implantable Lead Implant Date: 20150427
Implantable Lead Implant Date: 20150427
Implantable Lead Location: 753859
Implantable Lead Location: 753860
Implantable Lead Model: 5076
Implantable Lead Model: 6935
Implantable Pulse Generator Implant Date: 20150427
Lead Channel Impedance Value: 399 Ohm
Lead Channel Impedance Value: 4047 Ohm
Lead Channel Impedance Value: 4047 Ohm
Lead Channel Impedance Value: 4047 Ohm
Lead Channel Impedance Value: 551 Ohm
Lead Channel Impedance Value: 608 Ohm
Lead Channel Pacing Threshold Amplitude: 0.5 V
Lead Channel Pacing Threshold Amplitude: 0.625 V
Lead Channel Pacing Threshold Pulse Width: 0.4 ms
Lead Channel Pacing Threshold Pulse Width: 0.4 ms
Lead Channel Sensing Intrinsic Amplitude: 11.625 mV
Lead Channel Sensing Intrinsic Amplitude: 11.625 mV
Lead Channel Sensing Intrinsic Amplitude: 2 mV
Lead Channel Sensing Intrinsic Amplitude: 2 mV
Lead Channel Setting Pacing Amplitude: 2 V
Lead Channel Setting Pacing Amplitude: 2.5 V
Lead Channel Setting Pacing Pulse Width: 0.4 ms
Lead Channel Setting Sensing Sensitivity: 0.3 mV

## 2017-02-17 MED ORDER — LOSARTAN POTASSIUM 25 MG PO TABS: 25.0000 mg | ORAL_TABLET | Freq: Every day | ORAL | 3 refills | Status: DC

## 2017-02-17 MED ORDER — LOSARTAN POTASSIUM 25 MG PO TABS: 25.0000 mg | ORAL_TABLET | Freq: Every day | ORAL | 1 refills | Status: DC

## 2017-02-17 NOTE — Telephone Encounter (Signed)
With history of CHF would recommend change to losartan 25mg  daily. If able monitor pressures several times per week for 3-4 weeks after change. If not able could be scheduled for BP check 3-4 weeks in office.

## 2017-02-17 NOTE — Progress Notes (Signed)
Agree with daily Lasix for 2 days, then prn; then follow up ICM clinic device check next week. Richardson Dopp, PA-C    02/17/2017 9:22 AM

## 2017-02-17 NOTE — Telephone Encounter (Signed)
Spoke with pt and made her aware of recommendations per Tana Coast, [facility].  Pt verbalized understanding and was in agreement with this plan.  Pt will monitor BP over the next 3-4 weeks and then call with those readings at that time.  Pt appreciative for call.

## 2017-02-17 NOTE — Patient Instructions (Signed)
Plantar Flexion With Band    With resistive band around forefoot, press foot down (push your toes away from your face) while maintaining resistance with arms. Slowly allow toes to come back to start position.  Hold ___3_ seconds. Repeat ___10_ times. Do __1__ sessions per day.  Copyright  VHI. All rights reserved.

## 2017-02-18 NOTE — Therapy (Signed)
[facility] [address] [place], [place], [address] Phone: [phone]   Fax:  [phone]  Physical Therapy Treatment  Patient Details  Name: Veronica Shaw MRN: [id number] Date of Birth: 09-Apr-1951 Referring Provider: Rogue Bussing   Encounter Date: 02/17/2017      PT End of Session - 02/18/17 1102    Visit Number 3   Number of Visits 17   Date for PT Re-Evaluation 04/11/17   Authorization Time Period Medicare g-code every 10th visit   PT Start Time 1102   PT Stop Time 1147   PT Time Calculation (min) 45 min   Equipment Utilized During Treatment Gait belt   Activity Tolerance Patient tolerated treatment well   Behavior During Therapy [facility] for tasks assessed/performed      Past Medical History:  Diagnosis Date  . Asthma   . Chronic systolic CHF (congestive heart failure) ([place])    a. cMRI 4/15: EF 34% and findings - c/w NICM, normal RV size and function (RVEF 61%), Mild MR // b. Echo 2/15:  EF 30-35%, diff HK, ant-sept AK, Gr 2 DD, mild MR, trivial TR  //  c. Echo 5/17: EF 20-25%, severe diffuse HK, marked systolic dyssynchrony, grade 1 diastolic dysfunction, mild MR  //  d. RHC 5/17: Fick CO 2.9, RVSP 19, PASP 15, PW mean 2, low filing pressures and preserved CO   . Diabetes mellitus   . Gastritis   . History of echocardiogram    Echo 6/18: EF 30-35, diffuse HK, grade 1 diastolic dysfunction, trivial MR, mild LAE, mild TR, no pericardial effusion  . History of nuclear stress test    Myoview 5/18: EF 49, no ischemia, inferoseptal defect c/w LBBB artifact (intermediate risk due to EF < 50).  [name] Kitchen HTN (hypertension)   . Hyperlipidemia   . NICM (nonischemic cardiomyopathy) ([place])    a. Nuclear 5/13: Normal stress nuclear study. LV Ejection Fraction: 58%  //  b. LHC 10/14: Minor luminal irregularity in prox LAD, EF 35%   . Parkinson disease ([place])   . Plantar fasciitis   . Sleep apnea    was retested and no  longer had it and so d/c CPAP  . Urticaria     Past Surgical History:  Procedure Laterality Date  . BUNIONECTOMY    . CARDIAC CATHETERIZATION N/A 12/16/2015   Procedure: Right Heart Cath;  Surgeon: Sherren Mocha, MD;  Location: [facility] CV LAB;  Service: Cardiovascular;  Laterality: N/A;  . CHOLECYSTECTOMY    . IMPLANTABLE CARDIOVERTER DEFIBRILLATOR IMPLANT  11-25-13   MDT dual chamber ICD implanted by [name] for primary prevention  . IMPLANTABLE CARDIOVERTER DEFIBRILLATOR IMPLANT N/A 11/25/2013   Procedure: IMPLANTABLE CARDIOVERTER DEFIBRILLATOR IMPLANT;  Surgeon: [name], MD;  Location: [facility] CATH LAB;  Service: Cardiovascular;  Laterality: N/A;  . LEFT AND RIGHT HEART CATHETERIZATION WITH CORONARY ANGIOGRAM N/A 05/31/2013   Procedure: LEFT AND RIGHT HEART CATHETERIZATION WITH CORONARY ANGIOGRAM;  Surgeon: [name], MD;  Location: [facility] CATH LAB;  Service: Cardiovascular;  Laterality: N/A;  . TONSILLECTOMY    . TUBAL LIGATION      There were no vitals filed for this visit.      Subjective Assessment - 02/17/17 1106    Subjective Worn out from doing the exercises. Can't walk on toes on Lt as well as right. Continues to feel off-balance.   Pertinent History PD, CHF,DM   Patient Stated Goals stop leg cramps and falling; walk better  Currently in Pain? Yes   Pain Score 4    Pain Location Leg   Pain Orientation Right   Pain Descriptors / Indicators Radiating   Pain Type Chronic pain            [facility] PT Assessment - 02/18/17 0001      Sensation   Light Touch --  equal bil LE   Additional Comments pt reports sciatic/neuropathic pain bil LEs and occasionally RUE            Vestibular Assessment - 02/18/17 0001      Vestibular Assessment   General Observation veers/staggers when walking     Symptom Behavior   Type of Dizziness Imbalance   Frequency of Dizziness daily   Duration of Dizziness <30 sec   Aggravating Factors Turning head sideways    Relieving Factors Head stationary     Occulomotor Exam   Occulomotor Alignment Normal   Spontaneous Absent   Gaze-induced Absent   Smooth Pursuits Intact   Saccades Intact     Vestibulo-Occular Reflex   VOR 1 Head Only (x 1 viewing) intact 30 sec     Visual Acuity   Static 8   Dynamic 8                 [facility] Adult PT Treatment/Exercise - 02/18/17 0001      Transfers   Transfers Sit to Stand;Stand to Sit   Sit to Stand 6: Modified independent (Device/Increase time);With upper extremity assist   Stand to Sit 6: Modified independent (Device/Increase time);With upper extremity assist     Ambulation/Gait   Ambulation/Gait Assistance 6: Modified independent (Device/Increase time)   Ambulation Distance (Feet) 120 Feet  60, 60   Assistive device Straight cane;None   Gait Pattern Step-through pattern;Decreased arm swing - right;Decreased arm swing - left;Decreased step length - left;Decreased step length - right  decr push off LLE; veers lt and rt   Ambulation Surface Level     Exercises   Exercises Ankle     Ankle Exercises: Seated   Other Seated Ankle Exercises resisted lt plantarflexion, yellow band held in bil hands, forefoot pressing against band 10 x 2 sets             Balance Exercises - 02/18/17 1058      Balance Exercises: Standing   SLS Eyes open;Intermittent upper extremity support  bil   Heel Raises Limitations bil x 5; unilateral x 5 rt and unable on lt     OTAGO PROGRAM   Toe Walk Support           PT Education - 02/18/17 1101    Education provided Yes   Education Details addition to HEP; results of vestibular assessment (nothing yet apparent)   Person(s) Educated Patient   Methods Explanation;Demonstration;Handout   Comprehension Verbalized understanding;Returned demonstration;Need further instruction          PT Short Term Goals - 02/11/17 1122      PT SHORT TERM GOAL #1   Title Pt will be able to tolerate 5 min of continuous  activity in standing in order to safely manage self care activities   Baseline 1-2 min   Time 2   Period Weeks   Status New     PT SHORT TERM GOAL #2   Title Pt will be able to walk , level surface  500' with RPE < 3/10 in order to safely amb in home and short walks in community    Baseline  115' is max effort  RPE  5/10   Time 4   Period Weeks   Status New     PT SHORT TERM GOAL #3   Title Pt will demonstrate functional understanding of energy conservation techniques ADL's    Baseline pt is overwhelmed and focused on  poor endurance    Time 4   Period Weeks           PT Long Term Goals - 02/11/17 1128      PT LONG TERM GOAL #1   Title Pt will demonstrate low fall risk standing and transitional mvts evident with Merrilee Jansky >52    Baseline 48/56   Time 8   Period Weeks   Status New     PT LONG TERM GOAL #2   Title Pt will be able to walk for 15 minutes w/o loss of balance or RPE >3/10 using LRAD   Baseline amb 115' with SPC- laterally varying gait pattern   Time 8   Period Weeks     PT LONG TERM GOAL #3   Title Pt will be independent with a high level HEP (ie. LSVT exs) with focus on core strength and reciprocal mvt patterns to give her long term maintenance of functional mvt patters   Baseline minimal focus on HEP and poor understanding of PD   Time 8   Period Weeks               Plan - 02/18/17 1104    Clinical Impression Statement Session focused on balance and vestibular assessment due to reports of imbalance when turning head. No positive vestibular findings. Did note severe weakness Lt plantarflexors and addition to HEP made. Patient somewhat limited in her mobility due to back and leg pain, however is willing to try all tasks requested. Patient will continue to benefit from skilled PT.    Clinical Impairments Affecting Rehab Potential LBP, endurance, balance deficit   PT Frequency 2x / week   PT Duration 8 weeks   PT Treatment/Interventions Functional  mobility training;Stair training;Gait training;Therapeutic activities;Therapeutic exercise;Balance training;Neuromuscular re-education;Patient/family education;Manual techniques;Energy conservation;Vestibular   PT Next Visit Plan develop HEP for balance with consideration for back pain and bil sciatica; ?PWR movements    Consulted and Agree with Plan of Care Patient      Patient will benefit from skilled therapeutic intervention in order to improve the following deficits and impairments:  Decreased balance, Decreased endurance, Decreased mobility, Difficulty walking, Impaired sensation, Impaired perceived functional ability, Dizziness, Cardiopulmonary status limiting activity, Decreased activity tolerance, Decreased strength, Pain  Visit Diagnosis: Difficulty in walking, not elsewhere classified  Unsteadiness on feet  Muscle weakness (generalized)     Problem List Patient Active Problem List   Diagnosis Date Noted  . Hair loss 07/21/2016  . Middle ear effusion, bilateral 04/28/2016  . Suprapubic pain 04/14/2016  . Allergic rhinitis 11/09/2015  . Upper airway cough syndrome 08/20/2015  . Osteopenia 07/10/2015  . Mild persistent asthma in adult without complication 37/05/6268  . Bunion, left foot 05/20/2014  . NICM (nonischemic cardiomyopathy) ([place]) 04/23/2014  . Arthritis or polyarthritis, rheumatoid ([place]) 03/12/2014  . ICD (implantable cardioverter-defibrillator), dual, in situ 03/06/2014  . Thoracic or lumbosacral neuritis or radiculitis, unspecified 07/03/2013  . Chronic systolic heart failure ([place]) 05/31/2013  . Parkinson disease ([place]) 05/21/2013  . 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 Major depression 03/31/2013  . Balance problem 03/03/2013  . Lower extremity edema 03/03/2013  . GERD (gastroesophageal reflux disease) 09/27/2012  .  Blurry vision, bilateral 09/27/2012  . Dyspnea 09/19/2012  . Plantar fasciitis, bilateral 06/15/2012  . Cervical pain (neck)  04/12/2012  . 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 Diabetic peripheral neuropathy ([place]) 12/09/2011  . Hyperlipidemia 12/09/2011  . Sleep apnea 12/09/2011  . Fatigue 10/13/2011  . Chronic urticaria 05/27/2011  . 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 05/20/2011  . Sciatic leg pain 07/22/2009  . ROSACEA 05/29/2009  . ACHILLES BURSITIS OR TENDINITIS 03/27/2009  . Type II diabetes mellitus with complication ([place]) 20/99/0689  . Essential hypertension 09/28/2006    Rexanne Mano, PT 02/18/2017, 11:10 AM  [facility] [address] [place], [place], [address] Phone: [phone]   Fax:  [phone]  Name: Veronica Shaw MRN: [id number] Date of Birth: Feb 20, 1951

## 2017-02-23 ENCOUNTER — Telehealth: Payer: Self-pay | Admitting: Cardiology

## 2017-02-23 ENCOUNTER — Encounter: Payer: Self-pay | Admitting: Cardiology

## 2017-02-23 NOTE — Telephone Encounter (Signed)
LMOVM reminding pt to send remote transmission.   

## 2017-02-24 ENCOUNTER — Ambulatory Visit: Payer: Medicare Other | Admitting: Physical Therapy

## 2017-02-24 NOTE — Progress Notes (Signed)
No ICM remote transmission received for 02/23/2017 and next ICM transmission scheduled for 03/21/2017.

## 2017-02-28 ENCOUNTER — Encounter: Payer: Self-pay | Admitting: Physical Therapy

## 2017-02-28 ENCOUNTER — Ambulatory Visit: Payer: Medicare Other | Admitting: Physical Therapy

## 2017-02-28 DIAGNOSIS — M6281 Muscle weakness (generalized): Secondary | ICD-10-CM

## 2017-02-28 DIAGNOSIS — R2681 Unsteadiness on feet: Secondary | ICD-10-CM

## 2017-02-28 DIAGNOSIS — R2689 Other abnormalities of gait and mobility: Secondary | ICD-10-CM

## 2017-02-28 DIAGNOSIS — R296 Repeated falls: Secondary | ICD-10-CM | POA: Diagnosis not present

## 2017-02-28 DIAGNOSIS — R262 Difficulty in walking, not elsewhere classified: Secondary | ICD-10-CM | POA: Diagnosis not present

## 2017-02-28 NOTE — Therapy (Signed)
[facility] [address] [place], [place], [address] Phone: [phone]   Fax:  [phone]  Physical Therapy Treatment  Patient Details  Name: Veronica Shaw MRN: [id number] Date of Birth: 09-18-1950 Referring Provider: Rogue Bussing   Encounter Date: 02/28/2017      PT End of Session - 02/28/17 1238    Visit Number 4   Number of Visits 17   Date for PT Re-Evaluation 04/11/17   Authorization Time Period Medicare g-code every 10th visit   PT Start Time 1234   PT Stop Time 1315   PT Time Calculation (min) 41 min   Equipment Utilized During Treatment Gait belt   Activity Tolerance Patient tolerated treatment well   Behavior During Therapy [facility] for tasks assessed/performed      Past Medical History:  Diagnosis Date  . Asthma   . Chronic systolic CHF (congestive heart failure) ([place])    a. cMRI 4/15: EF 34% and findings - c/w NICM, normal RV size and function (RVEF 61%), Mild MR // b. Echo 2/15:  EF 30-35%, diff HK, ant-sept AK, Gr 2 DD, mild MR, trivial TR  //  c. Echo 5/17: EF 20-25%, severe diffuse HK, marked systolic dyssynchrony, grade 1 diastolic dysfunction, mild MR  //  d. RHC 5/17: Fick CO 2.9, RVSP 19, PASP 15, PW mean 2, low filing pressures and preserved CO   . Diabetes mellitus   . Gastritis   . History of echocardiogram    Echo 6/18: EF 30-35, diffuse HK, grade 1 diastolic dysfunction, trivial MR, mild LAE, mild TR, no pericardial effusion  . History of nuclear stress test    Myoview 5/18: EF 49, no ischemia, inferoseptal defect c/w LBBB artifact (intermediate risk due to EF < 50).  [name] Kitchen HTN (hypertension)   . Hyperlipidemia   . NICM (nonischemic cardiomyopathy) ([place])    a. Nuclear 5/13: Normal stress nuclear study. LV Ejection Fraction: 58%  //  b. LHC 10/14: Minor luminal irregularity in prox LAD, EF 35%   . Parkinson disease ([place])   . Plantar fasciitis   . Sleep apnea    was retested and no  longer had it and so d/c CPAP  . Urticaria     Past Surgical History:  Procedure Laterality Date  . BUNIONECTOMY    . CARDIAC CATHETERIZATION N/A 12/16/2015   Procedure: Right Heart Cath;  Surgeon: Sherren Mocha, MD;  Location: [facility] CV LAB;  Service: Cardiovascular;  Laterality: N/A;  . CHOLECYSTECTOMY    . IMPLANTABLE CARDIOVERTER DEFIBRILLATOR IMPLANT  11-25-13   MDT dual chamber ICD implanted by [name] for primary prevention  . IMPLANTABLE CARDIOVERTER DEFIBRILLATOR IMPLANT N/A 11/25/2013   Procedure: IMPLANTABLE CARDIOVERTER DEFIBRILLATOR IMPLANT;  Surgeon: [name], MD;  Location: [facility] CATH LAB;  Service: Cardiovascular;  Laterality: N/A;  . LEFT AND RIGHT HEART CATHETERIZATION WITH CORONARY ANGIOGRAM N/A 05/31/2013   Procedure: LEFT AND RIGHT HEART CATHETERIZATION WITH CORONARY ANGIOGRAM;  Surgeon: [name], MD;  Location: [facility] CATH LAB;  Service: Cardiovascular;  Laterality: N/A;  . TONSILLECTOMY    . TUBAL LIGATION      There were no vitals filed for this visit.      Subjective Assessment - 02/28/17 1237    Subjective No new complaints. Reports the HEP is getting better, even the toe walking. Reports she went to a healing service this weekend and feels her walking is much better now.    Pertinent History PD, CHF,DM  Limitations Standing;Walking   How long can you sit comfortably? 30 min   How long can you stand comfortably? 2-3 min   How long can you walk comfortably? 5 min   Patient Stated Goals stop leg cramps and falling; walk better   Currently in Pain? No/denies   Pain Score 0-No pain              [facility] Adult PT Treatment/Exercise - 02/28/17 1240      Transfers   Transfers Sit to Stand;Stand to Sit   Sit to Stand 6: Modified independent (Device/Increase time);With upper extremity assist   Stand to Sit 6: Modified independent (Device/Increase time);With upper extremity assist     Ambulation/Gait   Ambulation/Gait Yes   Ambulation/Gait  Assistance 5: Supervision   Ambulation/Gait Assistance Details no increase in pain with gait (does report foot pain from bunion on left foot, 1/10 before and after gait).  no back or leg pain reported. mild veering noted with gait, no balance loss noted. Dyspnea 2/4 after gait as well.                                Ambulation Distance (Feet) 500 Feet   Assistive device Straight cane   Gait Pattern Step-through pattern;Decreased stride length;Narrow base of support   Ambulation Surface Level;Indoor;Unlevel;Outdoor;Paved     Self-Care   Self-Care Other Self-Care Comments   Other Self-Care Comments  pt report having a stiff neck every morning with difficultly lifting it up when she wakes up that improves as the day progresses. on discussion it was discovered that she sleeps with up to 4 pillows behind her neck, in addition to a cervical pillow around her neck putting her in significant cervical flexion all night long. discussed the use of a wedge instead to allow her neck to stay in a more neutral position with sleeping for less stiffness/pain in the mornings. Provided pt information on various wedges found on [place] for her to take home as she reports her kids shop online all the time and will help her with this.                                   Balance Exercises - 02/28/17 1248      Balance Exercises: Standing   Rockerboard Anterior/posterior;Lateral;Head turns;EO;EC;30 seconds;10 reps     Balance Exercises: Standing   Rebounder Limitations performed both ways on balance board without UE support: EO rocking board with emphasis on tall posture and weight shifting, progressing to holding board steady with EC no head movements, progressing to EC head movements left<>right and up<>down x 10 reps. min guard to min assist for balance with cues for posture and weight shifting for balance.                                                            PT Short Term Goals - 02/11/17 1122       PT SHORT TERM GOAL #1   Title Pt will be able to tolerate 5 min of continuous activity in standing in order to safely manage self care activities   Baseline 1-2 min   Time 2  Period Weeks   Status New     PT SHORT TERM GOAL #2   Title Pt will be able to walk , level surface  500' with RPE < 3/10 in order to safely amb in home and short walks in community    Baseline 115' is max effort  RPE  5/10   Time 4   Period Weeks   Status New     PT SHORT TERM GOAL #3   Title Pt will demonstrate functional understanding of energy conservation techniques ADL's    Baseline pt is overwhelmed and focused on  poor endurance    Time 4   Period Weeks           PT Long Term Goals - 02/11/17 1128      PT LONG TERM GOAL #1   Title Pt will demonstrate low fall risk standing and transitional mvts evident with Merrilee Jansky >52    Baseline 48/56   Time 8   Period Weeks   Status New     PT LONG TERM GOAL #2   Title Pt will be able to walk for 15 minutes w/o loss of balance or RPE >3/10 using LRAD   Baseline amb 115' with SPC- laterally varying gait pattern   Time 8   Period Weeks     PT LONG TERM GOAL #3   Title Pt will be independent with a high level HEP (ie. LSVT exs) with focus on core strength and reciprocal mvt patterns to give her long term maintenance of functional mvt patters   Baseline minimal focus on HEP and poor understanding of PD   Time 8   Period Weeks               Plan - 02/28/17 1239    Clinical Impression Statement Today's skilled session continued to address gait, balance and actvitity tolerance with pt making steady progress toward goals. Pt was questioning if she should keep her next apt after the healing ceremony, Advised pt to at least return for one more vsist to ensure all is going well.  Pt is progressing and should benefit from continued PT to progress toward unmet goals.   Clinical Impairments Affecting Rehab Potential LBP, endurance, balance deficit   PT  Frequency 2x / week   PT Duration 8 weeks   PT Treatment/Interventions Functional mobility training;Stair training;Gait training;Therapeutic activities;Therapeutic exercise;Balance training;Neuromuscular re-education;Patient/family education;Manual techniques;Energy conservation;Vestibular   PT Next Visit Plan continue to work on gait and balance as back pain allows; ? PWR moves in standing   Consulted and Agree with Plan of Care Patient      Patient will benefit from skilled therapeutic intervention in order to improve the following deficits and impairments:  Decreased balance, Decreased endurance, Decreased mobility, Difficulty walking, Impaired sensation, Impaired perceived functional ability, Dizziness, Cardiopulmonary status limiting activity, Decreased activity tolerance, Decreased strength, Pain  Visit Diagnosis: Unsteadiness on feet  Muscle weakness (generalized)  Other abnormalities of gait and mobility     Problem List Patient Active Problem List   Diagnosis Date Noted  . Hair loss 07/21/2016  . Middle ear effusion, bilateral 04/28/2016  . Suprapubic pain 04/14/2016  . Allergic rhinitis 11/09/2015  . Upper airway cough syndrome 08/20/2015  . Osteopenia 07/10/2015  . Mild persistent asthma in adult without complication 51/76/1607  . Bunion, left foot 05/20/2014  . NICM (nonischemic cardiomyopathy) ([place]) 04/23/2014  . Arthritis or polyarthritis, rheumatoid ([place]) 03/12/2014  . ICD (implantable cardioverter-defibrillator), dual, in situ 03/06/2014  .  Thoracic or lumbosacral neuritis or radiculitis, unspecified 07/03/2013  . Chronic systolic heart failure ([place]) 05/31/2013  . Parkinson disease ([place]) 05/21/2013  . 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 Major depression 03/31/2013  . Balance problem 03/03/2013  . Lower extremity edema 03/03/2013  . GERD (gastroesophageal reflux disease) 09/27/2012  . Blurry vision, bilateral 09/27/2012  . Dyspnea 09/19/2012   . Plantar fasciitis, bilateral 06/15/2012  . Cervical pain (neck) 04/12/2012  . 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 Diabetic peripheral neuropathy ([place]) 12/09/2011  . Hyperlipidemia 12/09/2011  . Sleep apnea 12/09/2011  . Fatigue 10/13/2011  . Chronic urticaria 05/27/2011  . 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 05/20/2011  . Sciatic leg pain 07/22/2009  . ROSACEA 05/29/2009  . ACHILLES BURSITIS OR TENDINITIS 03/27/2009  . Type II diabetes mellitus with complication ([place]) 71/69/6789  . Essential hypertension 09/28/2006   Willow Ora, PTA, [facility] [address] [place], [place] [address] [phone] 02/28/17, 11:04 PM   Name: Veronica Shaw MRN: [id number] Date of Birth: 31-Mar-1951

## 2017-03-01 ENCOUNTER — Ambulatory Visit (INDEPENDENT_AMBULATORY_CARE_PROVIDER_SITE_OTHER): Payer: Medicare Other | Admitting: Pulmonary Disease

## 2017-03-01 ENCOUNTER — Encounter: Payer: Self-pay | Admitting: Pulmonary Disease

## 2017-03-01 VITALS — BP 126/64 | HR 78 | Ht 62.0 in | Wt 135.6 lb

## 2017-03-01 DIAGNOSIS — J309 Allergic rhinitis, unspecified: Secondary | ICD-10-CM | POA: Diagnosis not present

## 2017-03-01 DIAGNOSIS — K219 Gastro-esophageal reflux disease without esophagitis: Secondary | ICD-10-CM | POA: Diagnosis not present

## 2017-03-01 DIAGNOSIS — J4530 Mild persistent asthma, uncomplicated: Secondary | ICD-10-CM

## 2017-03-01 DIAGNOSIS — R058 Other specified cough: Secondary | ICD-10-CM

## 2017-03-01 DIAGNOSIS — R05 Cough: Secondary | ICD-10-CM | POA: Diagnosis not present

## 2017-03-01 NOTE — Progress Notes (Signed)
Subjective:    Patient ID: Veronica Shaw, female    DOB: 12-02-1950, 66 y.o.   MRN: [id number]  Synopsis: Asthma and upper airway cough syndrome. Formally seen by Dr. Melvyn Novas. Started care with Dr. Lake Bells in August 2018.  HPI Chief Complaint  Patient presents with  . Advice Only    pt switching from [facility] for treatment of Asthma.     [place] has had asthma ever since she was 66 years old and had a lot of hospitalizations as a child.  She says that her asthma has not been as bad as an adult but she is here to re-establish care with [place].    She says that her PCP recommended that she change to [facility] for insurance purposes.  Asthma: > she says that her symptoms are well controlled when using Dulera in the mornings > she has some dyspnea in the evenings when she lies down (on 4-5 pillows) > she needs to use her rescue inhaler daily, usually more in the evenings > she says albuterol helps, typically she needs it in the evenings > she wheezes sometimes  Post nasal drip: > worse lately > uses fluticasone as needed  Cough: > she has it regularly, brings up a lot of phlegm  GERD: > hast it most of the days > has a lot of acid reflux > she takes omeprazole in the mornings sometimes > she takes ranitidine prn   CHF: > she notes orthopnea > her weight has been stable, maybe down a little     Past Medical History:  Diagnosis Date  . Asthma   . Chronic systolic CHF (congestive heart failure) ([place])    a. cMRI 4/15: EF 34% and findings - c/w NICM, normal RV size and function (RVEF 61%), Mild MR // b. Echo 2/15:  EF 30-35%, diff HK, ant-sept AK, Gr 2 DD, mild MR, trivial TR  //  c. Echo 5/17: EF 20-25%, severe diffuse HK, marked systolic dyssynchrony, grade 1 diastolic dysfunction, mild MR  //  d. RHC 5/17: Fick CO 2.9, RVSP 19, PASP 15, PW mean 2, low filing pressures and preserved CO   . Diabetes mellitus   . Gastritis   . History of echocardiogram    Echo 6/18: EF 30-35, diffuse HK,  grade 1 diastolic dysfunction, trivial MR, mild LAE, mild TR, no pericardial effusion  . History of nuclear stress test    Myoview 5/18: EF 49, no ischemia, inferoseptal defect c/w LBBB artifact (intermediate risk due to EF < 50).  [name] Kitchen HTN (hypertension)   . Hyperlipidemia   . NICM (nonischemic cardiomyopathy) ([place])    a. Nuclear 5/13: Normal stress nuclear study. LV Ejection Fraction: 58%  //  b. LHC 10/14: Minor luminal irregularity in prox LAD, EF 35%   . Parkinson disease ([place])   . Plantar fasciitis   . Sleep apnea    was retested and no longer had it and so d/c CPAP  . Urticaria      Family History  Problem Relation Age of Onset  . Coronary artery disease Father        Died age 24  . Heart attack Father   . Diabetes Father   . Coronary artery disease Mother        Died age 20  . Heart attack Mother   . Diabetes Mother   . Stroke Mother   . Parkinson's disease Sister   . Heart disease Sister   . Hepatitis C Sister   .  Diabetes Son 37       T1DM  . Diabetes Sister   . Heart disease Sister   . Diabetes Sister   . Heart disease Sister      Social History   Social History  . Marital status: Married    Spouse name: N/A  . Number of children: 2  . Years of education: N/A   Occupational History  . retired     Quarry manager   Social History Main Topics  . Smoking status: Never Smoker  . Smokeless tobacco: Never Used     Comment: exposed to smoke during child hood (parents)  . Alcohol use No  . Drug use: No  . Sexual activity: Yes    Birth control/ protection: Surgical, Post-menopausal   Other Topics Concern  . Not on file   Social History Narrative   Lives at home with husband and the dog.     Allergies  Allergen Reactions  . Aspirin Swelling and Other (See Comments)    Causes nose bleeds *ONLY THE COATED ASA*  . Penicillins Shortness Of Breath and Rash    Shortness of Breath - Throat felt like it was closing.   [name] [Conj Estrog-Medroxyprogest Ace]  Shortness Of Breath    Throat swelling  . Ace Inhibitors Cough  . Simvastatin Other (See Comments) and Rash    Muscle aches     Outpatient Medications Prior to Visit  Medication Sig Dispense Refill  . albuterol (PROVENTIL HFA;VENTOLIN HFA) 108 (90 Base) MCG/ACT inhaler Inhale 2 puffs into the lungs every 4 (four) hours as needed for wheezing or shortness of breath.    [name] Kitchen albuterol (PROVENTIL) (2.5 MG/3ML) 0.083% nebulizer solution 2.5 mg every 6 (six) hours as needed.     . Calcium Carb-Cholecalciferol 600-800 MG-UNIT TABS Take 1 tablet by mouth daily. 90 tablet 3  . cyclobenzaprine (FLEXERIL) 5 MG tablet Take 1 tablet (5 mg total) by mouth 3 (three) times daily as needed for muscle spasms. 30 tablet 2  . fluticasone (FLONASE) 50 MCG/ACT nasal spray Place 2 sprays into both nostrils daily. (Patient taking differently: Place 2 sprays into both nostrils daily as needed. ) 16 g 1  . furosemide (LASIX) 40 MG tablet Take 20 mg by mouth daily as needed for fluid (For swelling).     [name] Kitchen losartan (COZAAR) 25 MG tablet Take 1 tablet (25 mg total) by mouth daily. 90 tablet 3  . metFORMIN (GLUCOPHAGE) 500 MG tablet TAKE 2 TABLETS TWICE DAILY WITH A MEAL 360 tablet 3  . metoprolol succinate (TOPROL-XL) 50 MG 24 hr tablet TAKE 1 AND 1/2 TABLETS DAILY FOR 1 WEEK, THEN INCREASE TO 2 TABLETS DAILY AS DIRECTED (DOSE CHANGE) 180 tablet 3  . omeprazole (PRILOSEC) 40 MG capsule Take 1 capsule (40 mg total) by mouth as directed. Take 40 mg 30 minutes before breakfast for 2 weeks. Then take as needed. 30 capsule 3  . potassium chloride (K-DUR,KLOR-CON) 10 MEQ tablet Take 1 tablet (10 mEq total) by mouth daily as needed (Take with lasix as needed for swelling). 30 tablet 6  . spironolactone (ALDACTONE) 25 MG tablet Take 1 tablet (25 mg total) by mouth every morning. 90 tablet 3  . fluticasone furoate-vilanterol (BREO ELLIPTA) 200-25 MCG/INH AEPB Inhale 1 puff into the lungs daily. (Patient not taking: Reported on  03/01/2017) 14 each 0   No facility-administered medications prior to visit.       Review of Systems  Constitutional: Negative for fever and unexpected weight  change.  HENT: Positive for postnasal drip and rhinorrhea. Negative for congestion, dental problem, ear pain, nosebleeds, sinus pressure, sneezing, sore throat and trouble swallowing.   Eyes: Negative for redness and itching.  Respiratory: Positive for cough, shortness of breath and wheezing. Negative for chest tightness.   Cardiovascular: Negative for palpitations and leg swelling.  Gastrointestinal: Negative for nausea and vomiting.  Genitourinary: Negative for dysuria.  Musculoskeletal: Negative for joint swelling.  Skin: Negative for rash.  Neurological: Negative for headaches.  Hematological: Does not bruise/bleed easily.  Psychiatric/Behavioral: Negative for dysphoric mood. The patient is not nervous/anxious.        Objective:   Physical Exam Vitals:   03/01/17 1114  BP: 126/64  Pulse: 78  SpO2: 100%  Weight: 135 lb 9.6 oz (61.5 kg)  Height: 5\' 2"  (1.575 m)   RA  Gen: well appearing, no acute distress HENT: NCAT, OP clear, neck supple without masses Eyes: PERRL, EOMi Lymph: no cervical lymphadenopathy PULM: CTA B CV: RRR, no mgr, no JVD GI: BS+, soft, nontender, no hsm Derm: no rash or skin breakdown MSK: normal bulk and tone Neuro: A&Ox4, CN II-XII intact, strength 5/5 in all 4 extremities Psyche: normal mood and affect   Pulmonary function testing: Spirometry June 2016 ratio 62%, FEV1 0.98 L 46% predicted July 2016 ratio normal, FEV1 1.97 L 93% predicted, 17% change with bronchodilator, FVC is 2.49 L 89% predicted, total lung capacity 4.32 L 93% predicted, DLCO 21.77 107% predicted January 2017 simple spirometry ratio 71%, FEV1 1.32 L 65% predicted  Exhaled nitric oxide testing: January 2017 79 ppm  Chest imaging: 2016 chest x-ray images independently reviewed showing hyperinflation and a dual-lead  defibrillator device.  Records from her primary care physician's office reviewed were she was referred to [place] for management of asthma. She was changed to [facility] for insurance purposes.      Assessment & Plan:  Mild persistent asthma in adult without complication  Upper airway cough syndrome  Allergic rhinitis, unspecified seasonality, unspecified trigger  Gastroesophageal reflux disease, esophagitis presence not specified  Discussion: Veronica Shaw returns to our clinic to establish care for her asthma once again. Her asthma would be characterized as poor control as she uses albuterol nearly every night. Fortunately she has not had an exacerbation in the last year or so. I believe that she experiences dyspnea in the evening because the [facility] starts to run out. However, she's been intolerant of taking this in the evenings because of some muscle cramps.  I think that it will be fine for her to start taking once a day inhaled fluticasone and vilanterol but as this is a dry powder inhaler I warned her that this may exacerbate her upper airway cough syndrome. I think she will have improvement in her dyspnea in the evenings with it however.  Her asthma will never get under control if she continues to take her routine medicines for GERD and allergic rhinitis on an as-needed basis. I explained to her once again today (it seems this is been a problem in the past) that she needs to use her routine medicines for allergic rhinitis and gastroesophageal reflux disease on a regular basis and not as needed.  Plan: For your asthma: Take Breo 1 puff daily, call me if this gives you any problems with cough Stop Dulera Keep using albuterol as needed for chest tightness wheezing or shortness of breath Get a flu shot in the fall  For allergic rhinitis: Take fluticasone 2 sprays each nostril daily  no matter how you feel, not as needed  For your gastroesophageal reflux disease: Take omeprazole in the mornings,  ranitidine in the evenings Follow a gastroesophageal lifestyle modification sheet we gave you  We will see you back in about 6-8 weeks with a nurse practitioner to see how you are doing on this regimen    Current Outpatient Prescriptions:  .  albuterol (PROVENTIL HFA;VENTOLIN HFA) 108 (90 Base) MCG/ACT inhaler, Inhale 2 puffs into the lungs every 4 (four) hours as needed for wheezing or shortness of breath., Disp: , Rfl:  .  albuterol (PROVENTIL) (2.5 MG/3ML) 0.083% nebulizer solution, 2.5 mg every 6 (six) hours as needed. , Disp: , Rfl:  .  Calcium Carb-Cholecalciferol 600-800 MG-UNIT TABS, Take 1 tablet by mouth daily., Disp: 90 tablet, Rfl: 3 .  cyclobenzaprine (FLEXERIL) 5 MG tablet, Take 1 tablet (5 mg total) by mouth 3 (three) times daily as needed for muscle spasms., Disp: 30 tablet, Rfl: 2 .  fluticasone (FLONASE) 50 MCG/ACT nasal spray, Place 2 sprays into both nostrils daily. (Patient taking differently: Place 2 sprays into both nostrils daily as needed. ), Disp: 16 g, Rfl: 1 .  furosemide (LASIX) 40 MG tablet, Take 20 mg by mouth daily as needed for fluid (For swelling). , Disp: , Rfl:  .  losartan (COZAAR) 25 MG tablet, Take 1 tablet (25 mg total) by mouth daily., Disp: 90 tablet, Rfl: 3 .  metFORMIN (GLUCOPHAGE) 500 MG tablet, TAKE 2 TABLETS TWICE DAILY WITH A MEAL, Disp: 360 tablet, Rfl: 3 .  metoprolol succinate (TOPROL-XL) 50 MG 24 hr tablet, TAKE 1 AND 1/2 TABLETS DAILY FOR 1 WEEK, THEN INCREASE TO 2 TABLETS DAILY AS DIRECTED (DOSE CHANGE), Disp: 180 tablet, Rfl: 3 .  mometasone-formoterol (DULERA) 200-5 MCG/ACT AERO, Inhale 2 puffs into the lungs daily as needed for wheezing., Disp: , Rfl:  .  omeprazole (PRILOSEC) 40 MG capsule, Take 1 capsule (40 mg total) by mouth as directed. Take 40 mg 30 minutes before breakfast for 2 weeks. Then take as needed., Disp: 30 capsule, Rfl: 3 .  potassium chloride (K-DUR,KLOR-CON) 10 MEQ tablet, Take 1 tablet (10 mEq total) by mouth daily as  needed (Take with lasix as needed for swelling)., Disp: 30 tablet, Rfl: 6 .  spironolactone (ALDACTONE) 25 MG tablet, Take 1 tablet (25 mg total) by mouth every morning., Disp: 90 tablet, Rfl: 3 .  fluticasone furoate-vilanterol (BREO ELLIPTA) 200-25 MCG/INH AEPB, Inhale 1 puff into the lungs daily. (Patient not taking: Reported on 03/01/2017), Disp: 14 each, Rfl: 0

## 2017-03-01 NOTE — Patient Instructions (Signed)
For your asthma: Take Breo 1 puff daily, call me if this gives you any problems with cough Stop Dulera Keep using albuterol as needed for chest tightness wheezing or shortness of breath Get a flu shot in the fall  For allergic rhinitis: Take fluticasone 2 sprays each nostril daily no matter how you feel, not as needed  For your gastroesophageal reflux disease: Take omeprazole in the mornings, ranitidine in the evenings Follow a gastroesophageal lifestyle modification sheet we gave you  We will see you back in about 6-8 weeks with a nurse practitioner to see how you are doing on this regimen

## 2017-03-03 ENCOUNTER — Ambulatory Visit: Payer: Medicare Other | Attending: Family Medicine | Admitting: Rehabilitative and Restorative Service Providers"

## 2017-03-03 DIAGNOSIS — R262 Difficulty in walking, not elsewhere classified: Secondary | ICD-10-CM | POA: Diagnosis not present

## 2017-03-03 DIAGNOSIS — R2689 Other abnormalities of gait and mobility: Secondary | ICD-10-CM | POA: Insufficient documentation

## 2017-03-03 DIAGNOSIS — R2681 Unsteadiness on feet: Secondary | ICD-10-CM | POA: Diagnosis not present

## 2017-03-03 DIAGNOSIS — R296 Repeated falls: Secondary | ICD-10-CM | POA: Insufficient documentation

## 2017-03-03 DIAGNOSIS — M6281 Muscle weakness (generalized): Secondary | ICD-10-CM

## 2017-03-03 NOTE — Patient Instructions (Addendum)
HIP: Hamstrings - Supine    Place strap around foot. Raise leg up, keep knee straight. Hold ___ seconds. ___ reps per set, ___ sets per day, ___ days per week   Copyright  VHI. All rights reserved.   Healthy Back Stretch - Standing    Keep feet apart and arms straight out from sides. Twist at waist as far as possible to the right, then to the left, in one continuous sweeping movement. Do not move hips and legs. Do _10___ times.   Copyright  VHI. All rights reserved.   Active Neck Rotation    With head in a comfortable position and chin gently tucked in, rotate head to the right. Hold __10__ seconds. Repeat to the left. Repeat __5__ times. Do __2__ sessions per day.  [email or web address].[email or web address]   Copyright  VHI. All rights reserved.   Habituation - Tip Card  1.The goal of habituation training is to assist in decreasing symptoms of vertigo, dizziness, or nausea provoked by specific head and body motions. 2.These exercises may initially increase symptoms; however, be persistent and work through symptoms. With repetition and time, the exercises will assist in reducing or eliminating symptoms. 3.Exercises should be stopped and discussed with the therapist if you experience any of the following: - Sudden change or fluctuation in hearing - New onset of ringing in the ears, or increase in current intensity - Any fluid discharge from the ear - Severe pain in neck or back - Extreme nausea  Copyright  VHI. All rights reserved.    Habituation - Sit to Side-Lying   Sit on edge of bed. Lie down onto the right side and hold until dizziness stops, plus 20 seconds.  Return to sitting and wait until dizziness stops, plus 20 seconds.  Repeat to the left side. Repeat sequence 5 times per session. Do 2 sessions per day.  Copyright  VHI. All rights reserved.    Gaze Stabilization: Sitting    Keeping eyes on target held in your hand and move head side to side for _30_ seconds. Do __2-3__  sessions per day.  Copyright  VHI. All rights reserved.   Gaze Stabilization: Tip Card  1.Target must remain in focus, not blurry, and appear stationary while head is in motion. 2.Perform exercises with small head movements (45 to either side of midline). 3.Increase speed of head motion so long as target is in focus. 4.If you wear eyeglasses, be sure you can see target through lens (therapist will give specific instructions for bifocal / progressive lenses). 5.These exercises may provoke dizziness or nausea. Work through these symptoms. If too dizzy, slow head movement slightly. Rest between each exercise. 6.Exercises demand concentration; avoid distractions.  Copyright  VHI. All rights reserved.

## 2017-03-03 NOTE — Therapy (Signed)
[facility] [address] [place], [place], [address] Phone: [phone]   Fax:  [phone]  Physical Therapy Treatment  Patient Details  Name: Veronica Shaw MRN: [id number] Date of Birth: October 11, 1950 Referring Provider: Rogue Bussing   Encounter Date: 03/03/2017      PT End of Session - 03/03/17 1221    Visit Number 5   Number of Visits 17   Date for PT Re-Evaluation 04/11/17   Authorization Time Period Medicare g-code every 10th visit   PT Start Time 1105   PT Stop Time 1150   PT Time Calculation (min) 45 min   Equipment Utilized During Treatment Gait belt   Activity Tolerance Patient tolerated treatment well   Behavior During Therapy [facility] for tasks assessed/performed      Past Medical History:  Diagnosis Date  . Asthma   . Chronic systolic CHF (congestive heart failure) ([place])    a. cMRI 4/15: EF 34% and findings - c/w NICM, normal RV size and function (RVEF 61%), Mild MR // b. Echo 2/15:  EF 30-35%, diff HK, ant-sept AK, Gr 2 DD, mild MR, trivial TR  //  c. Echo 5/17: EF 20-25%, severe diffuse HK, marked systolic dyssynchrony, grade 1 diastolic dysfunction, mild MR  //  d. RHC 5/17: Fick CO 2.9, RVSP 19, PASP 15, PW mean 2, low filing pressures and preserved CO   . Diabetes mellitus   . Gastritis   . History of echocardiogram    Echo 6/18: EF 30-35, diffuse HK, grade 1 diastolic dysfunction, trivial MR, mild LAE, mild TR, no pericardial effusion  . History of nuclear stress test    Myoview 5/18: EF 49, no ischemia, inferoseptal defect c/w LBBB artifact (intermediate risk due to EF < 50).  [name] Kitchen HTN (hypertension)   . Hyperlipidemia   . NICM (nonischemic cardiomyopathy) ([place])    a. Nuclear 5/13: Normal stress nuclear study. LV Ejection Fraction: 58%  //  b. LHC 10/14: Minor luminal irregularity in prox LAD, EF 35%   . Parkinson disease ([place])   . Plantar fasciitis   . Sleep apnea    was retested and no  longer had it and so d/c CPAP  . Urticaria     Past Surgical History:  Procedure Laterality Date  . BUNIONECTOMY    . CARDIAC CATHETERIZATION N/A 12/16/2015   Procedure: Right Heart Cath;  Surgeon: Sherren Mocha, MD;  Location: [facility];  Service: Cardiovascular;  Laterality: N/A;  . CHOLECYSTECTOMY    . IMPLANTABLE CARDIOVERTER DEFIBRILLATOR IMPLANT  11-25-13   MDT dual chamber ICD implanted by [name] for primary prevention  . IMPLANTABLE CARDIOVERTER DEFIBRILLATOR IMPLANT N/A 11/25/2013   Procedure: IMPLANTABLE CARDIOVERTER DEFIBRILLATOR IMPLANT;  Surgeon: [name], MD;  Location: [facility] CATH LAB;  Service: Cardiovascular;  Laterality: N/A;  . LEFT AND RIGHT HEART CATHETERIZATION WITH CORONARY ANGIOGRAM N/A 05/31/2013   Procedure: LEFT AND RIGHT HEART CATHETERIZATION WITH CORONARY ANGIOGRAM;  Surgeon: [name], MD;  Location: [facility] CATH LAB;  Service: Cardiovascular;  Laterality: N/A;  . TONSILLECTOMY    . TUBAL LIGATION      There were no vitals filed for this visit.      Subjective Assessment - 03/03/17 1112    Subjective The patient reports that she feels like she is tolerating exercises well.  She gets pain L bottom down the L leg and calls it sciatica.  She also notes "sciatica in her right leg and the arms."  She notes lightheaded sensation when she turns quickly and notes some intermittent dizziness.     Patient Stated Goals stop leg cramps and falling; walk better   Currently in Pain? No/denies  discomfort at rest, "not pain"                Vestibular Assessment - 03/03/17 1117      Vestibulo-Occular Reflex   Comment Limited neck ROM and reports today some dizziness with head motion.     Positional Testing   Sidelying Test Sidelying Right;Sidelying Left     Sidelying Right   Sidelying Right Duration no nystagmus viewed in room light, however patient notes general dizzy sensation that clears within seconds.   Sidelying Right Symptoms No  nystagmus  viewed in room light, mild dizziness with return to sitting     Sidelying Left   Sidelying Left Duration none   Sidelying Left Symptoms No nystagmus                 [facility] Adult PT Treatment/Exercise - 03/03/17 1125      Ambulation/Gait   Ambulation/Gait Yes   Ambulation/Gait Assistance 5: Supervision;6: Modified independent (Device/Increase time)   Ambulation/Gait Assistance Details PT emphasized increased stride length, arm swing providing tactile cues, and trunk engagement to reduce L trendelenberg pattern   Ambulation Distance (Feet) 230 Feet  115 x 3   Assistive device None   Gait Pattern Step-through pattern;Decreased stride length;Trendelenburg;Decreased arm swing - right;Decreased arm swing - left;Lateral trunk lean to left  Left hip abductor weakness with compensatory trunk lean   Ambulation Surface Level;Indoor   Gait Comments After stretching, the patient reports improved ability to hit heel strike on left foot during gait.  She responds well to verbal cues for stride length and tactile cues for arm swing.  Patient is guarded through trunk/spine during all standing tasks and moves en bloc with neck.     Exercises   Exercises Other Exercises   Other Exercises  SUPINE:  piriformis stretch with some assistance to lift leg Left ankle to right knee and reverse, supine hamstring + gastrocs stretch, trunk rotation.  SEATED:  neck AROM into rotation, shoulder rolls to reduce neck tension.  STANDING:  hip abduction x 10 reps left and then right with tactile and verbal cue son technique.  Trunk flexion/extension with weight bearing through UEs within tolerable range and standing trunk rotation.  *See updated HEP.          Vestibular Treatment/Exercise - 03/03/17 1117      Vestibular Treatment/Exercise   Vestibular Treatment Provided Gaze;Habituation   Habituation Exercises Seated Horizontal Head Turns;Brandt Daroff   Gaze Exercises X1 Viewing Horizontal      Nestor Lewandowsky   Number of Reps  3   Symptom Description  Symptoms decrease with repetition.     Seated Horizontal Head Turns   Number of Reps  5   Symptom Description  Encouraging patient to go to tolerable range of motion with increased speed for habituation.     X1 Viewing Horizontal   Foot Position x 20 seconds   Comments Tried standing, however patient moves shoulders.  MOved to sitting with cues on visual fixation and going through tolerable range of neck motion.               PT Education - 03/03/17 1219    Education provided Yes   Education Details modified HEP:  supine hamstring stretch/gastrocs stretch, standing lumbar rotation, seated neck rotation, habituation sit<>sidelying,  and gaze x 1 sitting   Person(s) Educated Patient   Methods Explanation;Demonstration;Handout   Comprehension Returned demonstration;Verbalized understanding          PT Short Term Goals - 03/03/17 1221      PT SHORT TERM GOAL #1   Title Pt will be able to tolerate 5 min of continuous activity in standing in order to safely manage self care activities   Baseline Patient performed standing walking and exercise >5 minutes nonstop demonstrating improved tolerance to standing self care tasks.   Time 2   Period Weeks   Status Achieved     PT SHORT TERM GOAL #2   Title Pt will be able to walk , level surface  500' with RPE < 3/10 in order to safely amb in home and short walks in [place] (03/14/17)   Baseline 115' is max effort  RPE  5/10   Time 4   Period Weeks   Status New     PT SHORT TERM GOAL #3   Title Pt will demonstrate functional understanding of energy conservation techniques ADL's    Baseline pt is overwhelmed and focused on  poor endurance    Time 4   Period Weeks           PT Long Term Goals - 03/03/17 1222      PT LONG TERM GOAL #1   Title Pt will demonstrate low fall risk standing and transitional mvts evident with Merrilee Jansky >52    Baseline  48/56   Time 8   Period Weeks   Status New   Target Date 04/11/17     PT LONG TERM GOAL #2   Title Pt will be able to walk for 15 minutes w/o loss of balance or RPE >3/10 using LRAD   Baseline amb 115' with SPC- laterally varying gait pattern   Time 8   Period Weeks   Target Date 04/11/17     PT LONG TERM GOAL #3   Title Pt will be independent with a high level HEP (ie. LSVT exs) with focus on core strength and reciprocal mvt patterns to give her long term maintenance of functional mvt patters   Baseline minimal focus on HEP and poor understanding of PD   Time 8   Period Weeks   Target Date 04/11/17               Plan - 03/03/17 1229    Clinical Impression Statement Modified HEP at today's session introducing habituation sit<>sidelying and gaze due to c/o dizziness with head and body motion.   Plan to add back in portions of HEP from 7/18 handout once dizziness and general mobility improved--want to focus on reducing en bloc motion.  Patient has multiple factors affecting mobility including decreased neck ROM with en bloc movement due to dizziness/imbalance, low back discomfort with reduced mobility, L hip weakness noted by L trendelenberg gait, dizziness worse with head motion.  PT to continue to progress activities to patient tolerance.   PT Treatment/Interventions Functional mobility training;Stair training;Gait training;Therapeutic activities;Therapeutic exercise;Balance training;Neuromuscular re-education;Patient/family education;Manual techniques;Energy conservation;Vestibular   PT Next Visit Plan Add prior activiites from 7/18 back into HEP as dizziness improves and dec'd en bloc movement.  Incorporate PWR moves as able to tolerate.  L hip abductor strengthening + core activities during gait to reduce trendelenberg pattern.  Work on longer stride and arm swing for gait activities.   Consulted and Agree with Plan of Care  Patient      Patient will benefit from skilled  therapeutic intervention in order to improve the following deficits and impairments:  Decreased balance, Decreased endurance, Decreased mobility, Difficulty walking, Impaired sensation, Impaired perceived functional ability, Dizziness, Cardiopulmonary status limiting activity, Decreased activity tolerance, Decreased strength, Pain  Visit Diagnosis: Unsteadiness on feet  Muscle weakness (generalized)  Other abnormalities of gait and mobility  Difficulty in walking, not elsewhere classified     Problem List Patient Active Problem List   Diagnosis Date Noted  . Hair loss 07/21/2016  . Middle ear effusion, bilateral 04/28/2016  . Suprapubic pain 04/14/2016  . Allergic rhinitis 11/09/2015  . Upper airway cough syndrome 08/20/2015  . Osteopenia 07/10/2015  . Mild persistent asthma in adult without complication 50/04/3817  . Bunion, left foot 05/20/2014  . NICM (nonischemic cardiomyopathy) ([place]) 04/23/2014  . Arthritis or polyarthritis, rheumatoid ([place]) 03/12/2014  . ICD (implantable cardioverter-defibrillator), dual, in situ 03/06/2014  . Thoracic or lumbosacral neuritis or radiculitis, unspecified 07/03/2013  . Chronic systolic heart failure ([place]) 05/31/2013  . Parkinson disease ([place]) 05/21/2013  . 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 Major depression 03/31/2013  . Balance problem 03/03/2013  . Lower extremity edema 03/03/2013  . GERD (gastroesophageal reflux disease) 09/27/2012  . Blurry vision, bilateral 09/27/2012  . Dyspnea 09/19/2012  . Plantar fasciitis, bilateral 06/15/2012  . Cervical pain (neck) 04/12/2012  . 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 Diabetic peripheral neuropathy ([place]) 12/09/2011  . Hyperlipidemia 12/09/2011  . Sleep apnea 12/09/2011  . Fatigue 10/13/2011  . Chronic urticaria 05/27/2011  . 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 05/20/2011  . Sciatic leg pain 07/22/2009  . ROSACEA 05/29/2009  . ACHILLES BURSITIS OR TENDINITIS 03/27/2009  . Type II  diabetes mellitus with complication ([place]) 29/93/7169  . Essential hypertension 09/28/2006    Antoneo Ghrist , PT 03/03/2017, 3:24 PM  [facility] [address] [place], [place], [address] Phone: [phone]   Fax:  [phone]  Name: Veronica Shaw MRN: [id number] Date of Birth: Apr 23, 1951

## 2017-03-07 NOTE — Progress Notes (Signed)
Cardiology Office Note:    Date:  03/08/2017   ID:  Veronica Shaw, DOB 1951-01-23, MRN [id number]  PCP:  Rogue Bussing, MD  Cardiologist:  Dr. Burt Knack Electrophysiologist: Dr. Cristopher Peru  Referring MD: Larey Seat*   Chief Complaint  Patient presents with  . Follow-up    CHF    History of Present Illness:    Veronica Shaw is a 66 y.o. female with a past medical history significant for Nonischemic cardiomyopathy, systolic CHF, status post AICD 10/2013, diabetes, hyperlipidemia, sleep apnea, asthma. Echocardiogram in 11/2015 demonstrated EF 97-41%, mild diastolic dysfunction and mild MR. Right heart cath and 11/2015 demonstrated preserved cardiac output and low intracardiac filling pressures. She was felt to be well compensated with her congestive heart failure and medical therapy was continued. She was last seen in the office on 12/28/16 by Richardson Dopp, PA. She has chronic dyspnea on exertion which was reportedly worse at that visit. She has chronic 5 pillow orthopnea. A repeat echocardiogram on 01/11/17 showed somewhat improved EF from 20-25% to 30-35%, otherwise without significant changes. A nuclear stress test showed no reversible ischemia. The patient was advised to follow-up with pulmonology  A remote transmission for her ICD on 02/16/17 showed abnormal impedance suggesting fluid accumulation. At that time the patient denied any fluid symptoms. She was advised to take her as needed Lasix daily for 2 days and then back to when necessary. She was to have another remote transmission on 7/26 to check fluid levels, however, this was not done by the patient. ICD checked today in office shows histogram is OK and thoracic impedence is at baseline.   The patient is currently getting home physical therapy to improve her functional status. The patient was recently seen on 8/1 by pulmonology for poorly controlled asthma. Recommendations included taking her GERD medication and  allergic rhinitis medication on a regular basis instead of as needed.  Today the patient is here alone. She is feeling well. Her home weight's have been stable at 135-137 lbs. Her breathing is at baseline with her usual chronic DOE and orthopnea. Her BP's have been fairly low at home 83-109/41-62, mostly in the 90's. She is having lightheadedness with standing and when she makes abrupt turns when walking. This morning she jumped out of bed to go to the bathroom and was very lightheaded on the toilet and fell to the side.   Past Medical History:  Diagnosis Date  . Asthma   . Chronic systolic CHF (congestive heart failure) ([place])    a. cMRI 4/15: EF 34% and findings - c/w NICM, normal RV size and function (RVEF 61%), Mild MR // b. Echo 2/15:  EF 30-35%, diff HK, ant-sept AK, Gr 2 DD, mild MR, trivial TR  //  c. Echo 5/17: EF 20-25%, severe diffuse HK, marked systolic dyssynchrony, grade 1 diastolic dysfunction, mild MR  //  d. RHC 5/17: Fick CO 2.9, RVSP 19, PASP 15, PW mean 2, low filing pressures and preserved CO   . Diabetes mellitus   . Gastritis   . History of echocardiogram    Echo 6/18: EF 30-35, diffuse HK, grade 1 diastolic dysfunction, trivial MR, mild LAE, mild TR, no pericardial effusion  . History of nuclear stress test    Myoview 5/18: EF 49, no ischemia, inferoseptal defect c/w LBBB artifact (intermediate risk due to EF < 50).  [name] Kitchen HTN (hypertension)   . Hyperlipidemia   . NICM (nonischemic cardiomyopathy) ([place])    a. Nuclear  5/13: Normal stress nuclear study. LV Ejection Fraction: 58%  //  b. LHC 10/14: Minor luminal irregularity in prox LAD, EF 35%   . Parkinson disease ([place])   . Plantar fasciitis   . Sleep apnea    was retested and no longer had it and so d/c CPAP  . Urticaria     Past Surgical History:  Procedure Laterality Date  . BUNIONECTOMY    . CARDIAC CATHETERIZATION N/A 12/16/2015   Procedure: Right Heart Cath;  Surgeon: Sherren Mocha, MD;  Location: [facility];  Service: Cardiovascular;  Laterality: N/A;  . CHOLECYSTECTOMY    . IMPLANTABLE CARDIOVERTER DEFIBRILLATOR IMPLANT  11-25-13   MDT dual chamber ICD implanted by [name] for primary prevention  . IMPLANTABLE CARDIOVERTER DEFIBRILLATOR IMPLANT N/A 11/25/2013   Procedure: IMPLANTABLE CARDIOVERTER DEFIBRILLATOR IMPLANT;  Surgeon: [name], MD;  Location: [facility];  Service: Cardiovascular;  Laterality: N/A;  . LEFT AND RIGHT HEART CATHETERIZATION WITH CORONARY ANGIOGRAM N/A 05/31/2013   Procedure: LEFT AND RIGHT HEART CATHETERIZATION WITH CORONARY ANGIOGRAM;  Surgeon: [name], MD;  Location: [facility] C[facility] LAB;  Service: Cardiovascular;  Laterality: N/A;  . TONSILLECTOMY    . TUBAL LIGATION      Current Medications: Current Meds  Medication Sig  . albuterol (PROVENTIL HFA;VENTOLIN HFA) 108 (90 Base) MCG/ACT inhaler Inhale 2 puffs into the lungs every 4 (four) hours as needed for wheezing or shortness of breath.  [name] Kitchen albuterol (PROVENTIL) (2.5 MG/3ML) 0.083% nebulizer solution 2.5 mg every 6 (six) hours as needed for wheezing or shortness of breath.   . Calcium Carb-Cholecalciferol 600-800 MG-UNIT TABS Take 1 tablet by mouth daily.  . cyclobenzaprine (FLEXERIL) 5 MG tablet Take 1 tablet (5 mg total) by mouth 3 (three) times daily as needed for muscle spasms.  . fluticasone (FLONASE) 50 MCG/ACT nasal spray Place 2 sprays into both nostrils daily.  . fluticasone furoate-vilanterol (BREO ELLIPTA) 200-25 MCG/INH AEPB Inhale 1 puff into the lungs daily.  . furosemide (LASIX) 40 MG tablet Take 20 mg by mouth daily as needed for fluid (For swelling).   [name] Kitchen losartan (COZAAR) 25 MG tablet Take 1 tablet (25 mg total) by mouth daily.  . metFORMIN (GLUCOPHAGE) 500 MG tablet TAKE 2 TABLETS TWICE DAILY WITH A MEAL  . metoprolol succinate (TOPROL-XL) 50 MG 24 hr tablet TAKE 1 AND 1/2 TABLETS DAILY FOR 1 WEEK, THEN INCREASE TO 2 TABLETS DAILY AS DIRECTED (DOSE CHANGE)  . omeprazole (PRILOSEC) 40 MG  capsule Take 1 capsule (40 mg total) by mouth as directed. Take 40 mg 30 minutes before breakfast for 2 weeks. Then take as needed.  . potassium chloride (K-DUR,KLOR-CON) 10 MEQ tablet Take 1 tablet (10 mEq total) by mouth daily as needed (Take with lasix as needed for swelling).  . [DISCONTINUED] spironolactone (ALDACTONE) 25 MG tablet Take 1 tablet (25 mg total) by mouth every morning.     Allergies:   Aspirin; Penicillins; Prempro [conj estrog-medroxyprogest ace]; Ace inhibitors; and Simvastatin   Social History   Social History  . Marital status: Married    Spouse name: N/A  . Number of children: 2  . Years of education: N/A   Occupational History  . retired     Quarry manager   Social History Main Topics  . Smoking status: Never Smoker  . Smokeless tobacco: Never Used     Comment: exposed to smoke during child hood (parents)  . Alcohol use No  . Drug use: No  .  Sexual activity: Yes    Birth control/ protection: Surgical, Post-menopausal   Other Topics Concern  . None   Social History Narrative   Lives at home with husband and the dog.     Family History: The patient's family history includes Coronary artery disease in her father and mother; Diabetes in her father, mother, sister, and sister; Diabetes (age of onset: 53) in her son; Heart attack in her father and mother; Heart disease in her sister, sister, and sister; Hepatitis C in her sister; Parkinson's disease in her sister; Stroke in her mother. ROS:   Please see the history of present illness.     All other systems reviewed and are negative.  EKGs/Labs/Other Studies Reviewed:    The following studies were reviewed today:  Nuclear stress test 12/29/16  Nuclear stress EF: 49%.  No T wave inversion was noted during stress.  There was no ST segment deviation noted during stress.  Defect 1: There is a large defect of moderate severity.  This is an intermediate risk study.  Large size, moderate intensity fixed  inferoseptal/septal perfusion defect consistent with LBBB artifact. No reversible ischemia. LVEF 49% with incoordinate septal motion. This is an intermediate risk study (due to LVEF <50).  Echocardiogram 01/11/17 Study Conclusions  - Left ventricle: The cavity size was normal. Wall thickness was   normal. Systolic function was moderately to severely reduced. The   estimated ejection fraction was in the range of 30% to 35%.   Diffuse hypokinesis. Doppler parameters are consistent with   abnormal left ventricular relaxation (grade 1 diastolic   dysfunction). - Aortic valve: Trileaflet; mildly thickened, mildly calcified   leaflets. - Mitral valve: There was trivial regurgitation. - Left atrium: The atrium was mildly dilated. Volume, ES, (1-plane   Simpson&'s, A2C): 35.1 ml. - Right ventricle: Pacer wire or catheter noted in right ventricle. - Right atrium: Pacer wire or catheter noted in right atrium. - Tricuspid valve: There was mild regurgitation.  Impressions: - Similar to prior study (EF was 25%)   EKG:  EKG is not ordered today.    Recent Labs: 07/21/2016: ALT 12; TSH 2.18 12/22/2016: Hemoglobin 13.4; Platelets 233 12/28/2016: NT-Pro BNP 244 01/25/2017: BUN 22; Creatinine, Ser 1.17; Potassium 4.5; Sodium 142   Recent Lipid Panel    Component Value Date/Time   CHOL 172 05/31/2013 0435   TRIG 142 05/31/2013 0435   HDL 49 05/31/2013 0435   CHOLHDL 3.5 05/31/2013 0435   VLDL 28 05/31/2013 0435   LDLCALC 95 05/31/2013 0435   LDLDIRECT 111 (H) 02/10/2015 1145    Physical Exam:    VS:  BP 98/64   Pulse 74   Ht 5\' 2"  (1.575 m)   Wt 137 lb 3.2 oz (62.2 kg)   LMP 08/01/2000 (Approximate)   SpO2 99%   BMI 25.09 kg/m     Wt Readings from Last 3 Encounters:  03/08/17 137 lb 3.2 oz (62.2 kg)  03/01/17 135 lb 9.6 oz (61.5 kg)  01/18/17 140 lb 9.6 oz (63.8 kg)     Physical Exam  Constitutional: She is oriented to person, place, and time. She appears well-developed and  well-nourished. No distress.  HENT:  Head: Normocephalic and atraumatic.  Neck: Normal range of motion. Neck supple. No JVD present. Carotid bruit is not present.  Cardiovascular: Normal rate, regular rhythm and normal heart sounds.  Exam reveals no gallop and no friction rub.   No murmur heard. Pulmonary/Chest: Effort normal and breath sounds normal. No respiratory  distress. She has no wheezes. She has no rales.  Abdominal: Soft. There is no tenderness.  Musculoskeletal: Normal range of motion. She exhibits no edema.  Neurological: She is alert and oriented to person, place, and time.  Skin: Skin is warm and dry.  Psychiatric: She has a normal mood and affect. Her behavior is normal.  Vitals reviewed.   ASSESSMENT:    1. Chronic systolic heart failure ([place])   2. NICM (nonischemic cardiomyopathy) ([place])   3. ICD (implantable cardioverter-defibrillator), dual, in situ   4. Gastroesophageal reflux disease, esophagitis presence not specified    PLAN:    In order of problems listed above:  1. Chronic systolic heart failure -Echocardiogram on 01/11/17 shows slight improvement in LV function with EF 30-35%, up from 25%. -The patient has intermittent increase in thoracic impedance on ICD which is improved with Lasix. Today impedance is at baseline -Current therapy includes losartan 25 mg, Toprol-XL 100 mg, spironolactone 25 mg and Lasix as needed -The patient appears euvolemic and her breathing is at baseline. -Continue current therapy except will decrease spironolactone due to recent low blood pressures and lightheadedness.   2. NICM -Patient was assessed for worsening of her chronic dyspnea and May with stress test finding no reversible ischemia and echo with slight improvement in LV function, otherwise no changes -Patient is tolerating daily activities well. -Continue current therapy  3. Hypertension -Patient was changed from valsartan to losartan due to recall. Her blood pressure is  running low at home, 98/64 here. Patient has complaints of lightheadedness upon rising and with abrupt position changes, occurring every day. We will try decreasing her Aldactone and continue to follow -Pt has been drinking only 1-1.5 bottles (12 oz) of water per day. Advised that she can drink a little more, 2-3 bottles per day especially when out in the hear.   4. CRT-D with optivol -There is no LV lead and thus no biventricular pacing capabilities -Download today shows baseline volume status -Followed by Dr. Lovena Le and the device clinic  5. GERD -Patient was advised by pulmonology to take her reflux medications daily instead of as needed for treatment of her chronic cough and shortness of breath. The patient reports that she is taking her medications daily now.   Medication Adjustments/Labs and Tests Ordered: Current medicines are reviewed at length with the patient today.  Concerns regarding medicines are outlined above. Labs and tests ordered and medication changes are outlined in the patient instructions below:  Patient Instructions  Medication Instructions:  1. DECREASE ALDACTONE TO 12.5 MG DAILY (THIS WILL BE 1/2 TABLET DAILY OF THE 25 MG TABLET)  Labwork: NONE ORDERED   Testing/Procedures: NONE ORDERED   Follow-Up: 06/19/17 @ 10:20 WITH DR. Burt Knack   Any Other Special Instructions Will Be Listed Below (If Applicable).     If you need a refill on your cardiac medications before your next appointment, please call your pharmacy.      Signed, Daune Perch, NP  03/08/2017 11:55 AM    [place]

## 2017-03-08 ENCOUNTER — Ambulatory Visit: Payer: Medicare Other | Admitting: Rehabilitative and Restorative Service Providers"

## 2017-03-08 ENCOUNTER — Ambulatory Visit (INDEPENDENT_AMBULATORY_CARE_PROVIDER_SITE_OTHER): Payer: Self-pay | Admitting: *Deleted

## 2017-03-08 ENCOUNTER — Ambulatory Visit (INDEPENDENT_AMBULATORY_CARE_PROVIDER_SITE_OTHER): Payer: Medicare Other | Admitting: Cardiology

## 2017-03-08 ENCOUNTER — Encounter: Payer: Self-pay | Admitting: Physician Assistant

## 2017-03-08 VITALS — BP 98/64 | HR 74 | Ht 62.0 in | Wt 137.2 lb

## 2017-03-08 DIAGNOSIS — I428 Other cardiomyopathies: Secondary | ICD-10-CM | POA: Diagnosis not present

## 2017-03-08 DIAGNOSIS — M6281 Muscle weakness (generalized): Secondary | ICD-10-CM

## 2017-03-08 DIAGNOSIS — R2689 Other abnormalities of gait and mobility: Secondary | ICD-10-CM

## 2017-03-08 DIAGNOSIS — K219 Gastro-esophageal reflux disease without esophagitis: Secondary | ICD-10-CM

## 2017-03-08 DIAGNOSIS — R2681 Unsteadiness on feet: Secondary | ICD-10-CM | POA: Diagnosis not present

## 2017-03-08 DIAGNOSIS — I5022 Chronic systolic (congestive) heart failure: Secondary | ICD-10-CM | POA: Diagnosis not present

## 2017-03-08 DIAGNOSIS — I10 Essential (primary) hypertension: Secondary | ICD-10-CM

## 2017-03-08 DIAGNOSIS — Z95810 Presence of automatic (implantable) cardiac defibrillator: Secondary | ICD-10-CM

## 2017-03-08 DIAGNOSIS — R262 Difficulty in walking, not elsewhere classified: Secondary | ICD-10-CM | POA: Diagnosis not present

## 2017-03-08 DIAGNOSIS — R296 Repeated falls: Secondary | ICD-10-CM | POA: Diagnosis not present

## 2017-03-08 LAB — CUP PACEART INCLINIC DEVICE CHECK
Battery Remaining Longevity: 95 mo
Battery Voltage: 2.97 V
Brady Statistic AP VP Percent: 0 %
Brady Statistic AP VS Percent: 0.25 %
Brady Statistic AS VP Percent: 0.03 %
Brady Statistic AS VS Percent: 99.71 %
Brady Statistic RA Percent Paced: 0.25 %
Brady Statistic RV Percent Paced: 0.03 %
Date Time Interrogation Session: 20180808094212
HighPow Impedance: 81 Ohm
Implantable Lead Implant Date: 20150427
Implantable Lead Implant Date: 20150427
Implantable Lead Location: 753859
Implantable Lead Location: 753860
Implantable Lead Model: 5076
Implantable Lead Model: 6935
Implantable Pulse Generator Implant Date: 20150427
Lead Channel Impedance Value: 4047 Ohm
Lead Channel Impedance Value: 4047 Ohm
Lead Channel Impedance Value: 4047 Ohm
Lead Channel Impedance Value: 418 Ohm
Lead Channel Impedance Value: 703 Ohm
Lead Channel Impedance Value: 722 Ohm
Lead Channel Pacing Threshold Amplitude: 0.625 V
Lead Channel Pacing Threshold Amplitude: 0.625 V
Lead Channel Pacing Threshold Pulse Width: 0.4 ms
Lead Channel Pacing Threshold Pulse Width: 0.4 ms
Lead Channel Sensing Intrinsic Amplitude: 12.125 mV
Lead Channel Sensing Intrinsic Amplitude: 12.125 mV
Lead Channel Sensing Intrinsic Amplitude: 2.25 mV
Lead Channel Sensing Intrinsic Amplitude: 2.25 mV
Lead Channel Setting Pacing Amplitude: 2 V
Lead Channel Setting Pacing Amplitude: 2.5 V
Lead Channel Setting Pacing Pulse Width: 0.4 ms
Lead Channel Setting Sensing Sensitivity: 0.3 mV

## 2017-03-08 MED ORDER — SPIRONOLACTONE 25 MG PO TABS: 12.5000 mg | ORAL_TABLET | Freq: Every day | ORAL | 3 refills | Status: DC

## 2017-03-08 NOTE — Patient Instructions (Addendum)
Medication Instructions:  1. DECREASE ALDACTONE TO 12.5 MG DAILY (THIS WILL BE 1/2 TABLET DAILY OF THE 25 MG TABLET)  Labwork: NONE ORDERED   Testing/Procedures: NONE ORDERED   Follow-Up: 06/19/17 @ 10:20 WITH DR. Burt Knack   Any Other Special Instructions Will Be Listed Below (If Applicable).     If you need a refill on your cardiac medications before your next appointment, please call your pharmacy.

## 2017-03-08 NOTE — Therapy (Signed)
[facility] [address] [place], [place], [address] Phone: [phone]   Fax:  [phone]  Physical Therapy Treatment  Patient Details  Name: Veronica Shaw MRN: [id number] Date of Birth: 03/29/1951 Referring Provider: Rogue Bussing   Encounter Date: 03/08/2017      PT End of Session - 03/08/17 1336    Visit Number 6   Number of Visits 17   Date for PT Re-Evaluation 04/11/17   Authorization Time Period Medicare g-code every 10th visit   PT Start Time 1105   PT Stop Time 1150   PT Time Calculation (min) 45 min   Equipment Utilized During Treatment Gait belt   Activity Tolerance Patient tolerated treatment well   Behavior During Therapy [facility] for tasks assessed/performed      Past Medical History:  Diagnosis Date  . Asthma   . Chronic systolic CHF (congestive heart failure) ([place])    a. cMRI 4/15: EF 34% and findings - c/w NICM, normal RV size and function (RVEF 61%), Mild MR // b. Echo 2/15:  EF 30-35%, diff HK, ant-sept AK, Gr 2 DD, mild MR, trivial TR  //  c. Echo 5/17: EF 20-25%, severe diffuse HK, marked systolic dyssynchrony, grade 1 diastolic dysfunction, mild MR  //  d. RHC 5/17: Fick CO 2.9, RVSP 19, PASP 15, PW mean 2, low filing pressures and preserved CO   . Diabetes mellitus   . Gastritis   . History of echocardiogram    Echo 6/18: EF 30-35, diffuse HK, grade 1 diastolic dysfunction, trivial MR, mild LAE, mild TR, no pericardial effusion  . History of nuclear stress test    Myoview 5/18: EF 49, no ischemia, inferoseptal defect c/w LBBB artifact (intermediate risk due to EF < 50).  [name] Kitchen HTN (hypertension)   . Hyperlipidemia   . NICM (nonischemic cardiomyopathy) ([place])    a. Nuclear 5/13: Normal stress nuclear study. LV Ejection Fraction: 58%  //  b. LHC 10/14: Minor luminal irregularity in prox LAD, EF 35%   . Parkinson disease ([place])   . Plantar fasciitis   . Sleep apnea    was retested and no  longer had it and so d/c CPAP  . Urticaria     Past Surgical History:  Procedure Laterality Date  . BUNIONECTOMY    . CARDIAC CATHETERIZATION N/A 12/16/2015   Procedure: Right Heart Cath;  Surgeon: Sherren Mocha, MD;  Location: [facility];  Service: Cardiovascular;  Laterality: N/A;  . CHOLECYSTECTOMY    . IMPLANTABLE CARDIOVERTER DEFIBRILLATOR IMPLANT  11-25-13   MDT dual chamber ICD implanted by [name] for primary prevention  . IMPLANTABLE CARDIOVERTER DEFIBRILLATOR IMPLANT N/A 11/25/2013   Procedure: IMPLANTABLE CARDIOVERTER DEFIBRILLATOR IMPLANT;  Surgeon: [name], MD;  Location: [facility];  Service: Cardiovascular;  Laterality: N/A;  . LEFT AND RIGHT HEART CATHETERIZATION WITH CORONARY ANGIOGRAM N/A 05/31/2013   Procedure: LEFT AND RIGHT HEART CATHETERIZATION WITH CORONARY ANGIOGRAM;  Surgeon: [name], MD;  Location: [facility] CATH LAB;  Service: Cardiovascular;  Laterality: N/A;  . TONSILLECTOMY    . TUBAL LIGATION      There were no vitals filed for this visit.      Subjective Assessment - 03/08/17 1104    Subjective The patinet reports that she had a near tumble today when standing up in the bathroom.  She reports that she has been doing the exercises regularly, and still notes minimal dizziness with exercises.   She reports  no leg cramps since last session.    Patient Stated Goals stop leg cramps and falling; walk better   Currently in Pain? No/denies                         [facility] Adult PT Treatment/Exercise - 03/08/17 1228      Ambulation/Gait   Ambulation/Gait Yes   Ambulation/Gait Assistance 6: Modified independent (Device/Increase time);5: Supervision   Ambulation/Gait Assistance Details PT emphasizing dec'd reliance on SPC in clinic with supervision x 250 feet nonstop.  Walked with SPC nonstop x 7 minutes emphasizing even stride length and slowing pace when fatigued.   Assistive device None;Straight cane   Ambulation Surface  Level;Indoor   Gait Comments Patient's gait is characterized by what appears to be a L antalgic pattern, however she does not have pain today.  This pattern may be habituation, or due to tightness/hip weakness.  PT to continue to address.  Encouraged greater walking in home 5 minutes, 3 times/day as well as dec'ing dependence on SPC in the home.  The patient obtained cane on her own and uses it for confidence per her report.     Exercises   Exercises Other Exercises   Other Exercises  PRONE: hip flexor stretch with passive overpressure into knee flexion, hip IR/ER passive ROM.  STANDING:  heel cord stretch with UE support, standing hip abduction x 10 reps.       Manual Therapy   Manual Therapy Joint mobilization;Soft tissue mobilization;Manual Traction   Manual therapy comments Performed due to c/o neck stiffness and patient moving en bloc with functional activities.   Joint Mobilization Mid cervical lateral glide for sidebending.  Upper cervical spine emphasized passive overpressure at end range with neck flexion.   Soft tissue mobilization scalenes bilaterally and upper trap bilaterally    Manual Traction supine gentle manual traction.                PT Education - 03/08/17 1147    Education provided Yes   Education Details Removed brandt daroff habituation due to BP is low.  Added heel cord stretch standing and standing hip abduction.  Recommended reducing dependence on cane in the home.   Person(s) Educated Patient   Methods Explanation;Demonstration;Handout   Comprehension Verbalized understanding;Returned demonstration          PT Short Term Goals - 03/03/17 1221      PT SHORT TERM GOAL #1   Title Pt will be able to tolerate 5 min of continuous activity in standing in order to safely manage self care activities   Baseline Patient performed standing walking and exercise >5 minutes nonstop demonstrating improved tolerance to standing self care tasks.   Time 2   Period  Weeks   Status Achieved     PT SHORT TERM GOAL #2   Title Pt will be able to walk , level surface  500' with RPE < 3/10 in order to safely amb in home and short walks in [place] (03/14/17)   Baseline 115' is max effort  RPE  5/10   Time 4   Period Weeks   Status New     PT SHORT TERM GOAL #3   Title Pt will demonstrate functional understanding of energy conservation techniques ADL's    Baseline pt is overwhelmed and focused on  poor endurance    Time 4   Period Weeks  PT Long Term Goals - 03/03/17 1222      PT LONG TERM GOAL #1   Title Pt will demonstrate low fall risk standing and transitional mvts evident with Merrilee Jansky >52    Baseline 48/56   Time 8   Period Weeks   Status New   Target Date 04/11/17     PT LONG TERM GOAL #2   Title Pt will be able to walk for 15 minutes w/o loss of balance or RPE >3/10 using LRAD   Baseline amb 115' with SPC- laterally varying gait pattern   Time 8   Period Weeks   Target Date 04/11/17     PT LONG TERM GOAL #3   Title Pt will be independent with a high level HEP (ie. LSVT exs) with focus on core strength and reciprocal mvt patterns to give her long term maintenance of functional mvt patters   Baseline minimal focus on HEP and poor understanding of PD   Time 8   Period Weeks   Target Date 04/11/17               Plan - 03/08/17 1339    Clinical Impression Statement The patient reports recent medication change due to low blood pressure with feeling of not having as much energy. Pt d/c'd brandt darroff at this time.   PT focused on multiple factors to improve overal mobility including:  1) neck ROM treating with stretching, and manual techniques as needed 2) dizziness and unsteadiness with head motion (provided VOR for home), 3) decreased endurance with PT encouraging to increase walking distance 4) Imbalance noted with daily activities.  PT to use PWR exercises, as well as impairment focused  activities to improve mobility and confidence.   PT Treatment/Interventions Functional mobility training;Stair training;Gait training;Therapeutic activities;Therapeutic exercise;Balance training;Neuromuscular re-education;Patient/family education;Manual techniques;Energy conservation;Vestibular   PT Next Visit Plan Check current HEP, incorporate PWR moves as tolerated.  L hip abductor strengthening, work on spinal rotaiton and dec'ing en bloc movement, gait without device and improving confidence iwth mobility.   Consulted and Agree with Plan of Care Patient      Patient will benefit from skilled therapeutic intervention in order to improve the following deficits and impairments:  Decreased balance, Decreased endurance, Decreased mobility, Difficulty walking, Impaired sensation, Impaired perceived functional ability, Dizziness, Cardiopulmonary status limiting activity, Decreased activity tolerance, Decreased strength, Pain  Visit Diagnosis: Unsteadiness on feet  Muscle weakness (generalized)  Other abnormalities of gait and mobility     Problem List Patient Active Problem List   Diagnosis Date Noted  . Hair loss 07/21/2016  . Middle ear effusion, bilateral 04/28/2016  . Suprapubic pain 04/14/2016  . Allergic rhinitis 11/09/2015  . Upper airway cough syndrome 08/20/2015  . Osteopenia 07/10/2015  . Mild persistent asthma in adult without complication 00/17/4944  . Bunion, left foot 05/20/2014  . NICM (nonischemic cardiomyopathy) ([place]) 04/23/2014  . 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 ICD (implantable cardioverter-defibrillator), dual, in situ 03/06/2014  . Thoracic or lumbosacral neuritis or radiculitis, unspecified 07/03/2013  . Chronic systolic heart failure ([place]) 05/31/2013  . Parkinson disease ([place]) 05/21/2013  . 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 Major depression 03/31/2013  . Balance problem 03/03/2013  . Lower extremity edema  03/03/2013  . GERD (gastroesophageal reflux disease) 09/27/2012  . Blurry vision, bilateral 09/27/2012  . Dyspnea 09/19/2012  . Plantar fasciitis, bilateral 06/15/2012  . Cervical pain (neck) 04/12/2012  . 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 Diabetic peripheral neuropathy ([place]) 12/09/2011  .  Hyperlipidemia 12/09/2011  . Sleep apnea 12/09/2011  . Fatigue 10/13/2011  . Chronic urticaria 05/27/2011  . 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 05/20/2011  . Sciatic leg pain 07/22/2009  . ROSACEA 05/29/2009  . ACHILLES BURSITIS OR TENDINITIS 03/27/2009  . Type II diabetes mellitus with complication ([place]) 10/93/2355  . Essential hypertension 09/28/2006    Nelda Luckey, PT 03/08/2017, 1:43 PM  [facility] [address] [place], [place], [address] Phone: [phone]   Fax:  [phone]  Name: Veronica Shaw MRN: [id number] Date of Birth: 11/05/1950

## 2017-03-08 NOTE — Progress Notes (Signed)
Add-on device check with Richardson Dopp, PA and Pecolia Ades, NP for [facility] reading. Optivol at baseline, pt asymptomatic. No episodes. [facility] as scheduled, ROV 05/26/17.

## 2017-03-08 NOTE — Patient Instructions (Signed)
HIP: Hamstrings - Supine    Place strap around foot. Raise leg up, keep knee straight. Hold ___ seconds. ___ reps per set, ___ sets per day, ___ days per week   Copyright  VHI. All rights reserved.   Healthy Back Stretch - Standing    Keep feet apart and arms straight out from sides. Twist at waist as far as possible to the right, then to the left, in one continuous sweeping movement. Do not move hips and legs. Do _10___ times.   Copyright  VHI. All rights reserved.   Active Neck Rotation    With head in a comfortable position and chin gently tucked in, rotate head to the right. Hold __10__ seconds. Repeat to the left. Repeat __5__ times. Do __2__ sessions per day.  [email or web address]   Copyright  VHI. All rights reserved.   Habituation - Tip Card  1.The goal of habituation training is to assist in decreasing symptoms of vertigo, dizziness, or nausea provoked by specific head and body motions. 2.These exercises may initially increase symptoms; however, be persistent and work through symptoms. With repetition and time, the exercises will assist in reducing or eliminating symptoms. 3.Exercises should be stopped and discussed with the therapist if you experience any of the following: - Sudden change or fluctuation in hearing - New onset of ringing in the ears, or increase in current intensity - Any fluid discharge from the ear - Severe pain in neck or back - Extreme nausea  Copyright  VHI. All rights reserved.    Gaze Stabilization: Sitting    Keeping eyes on target held in your hand and move head side to side for _30_ seconds. Do __2-3__ sessions per day.  Copyright  VHI. All rights reserved.   Gaze Stabilization: Tip Card  1.Target must remain in focus, not blurry, and appear stationary while head is in motion. 2.Perform exercises with small head movements (45 to either side of midline). 3.Increase speed of head motion so long as target is in  focus. 4.If you wear eyeglasses, be sure you can see target through lens (therapist will give specific instructions for bifocal / progressive lenses). 5.These exercises may provoke dizziness or nausea. Work through these symptoms. If too dizzy, slow head movement slightly. Rest between each exercise. 6.Exercises demand concentration; avoid distractions.  Copyright  VHI. All rights reserved.   Heel Cord Stretch    Place one leg forward, bent, other leg behind and straight. Lean forward keeping back heel flat. Hold __20__ seconds while counting out loud. Repeat with other leg. Repeat _3___ times. Do _2___ sessions per day.  [email or web address]   Copyright  VHI. All rights reserved.   HIP: Abduction - Standing (Band)    NO BAND. Squeeze glutes. Raise leg out and slightly back.  _10__ reps per set, _2__ sets per day.   Copyright  VHI. All rights reserved.

## 2017-03-10 ENCOUNTER — Encounter: Payer: Self-pay | Admitting: Physical Therapy

## 2017-03-10 ENCOUNTER — Ambulatory Visit: Payer: Medicare Other | Admitting: Physical Therapy

## 2017-03-10 DIAGNOSIS — M6281 Muscle weakness (generalized): Secondary | ICD-10-CM

## 2017-03-10 DIAGNOSIS — R2681 Unsteadiness on feet: Secondary | ICD-10-CM

## 2017-03-10 DIAGNOSIS — R262 Difficulty in walking, not elsewhere classified: Secondary | ICD-10-CM | POA: Diagnosis not present

## 2017-03-10 DIAGNOSIS — R296 Repeated falls: Secondary | ICD-10-CM | POA: Diagnosis not present

## 2017-03-10 DIAGNOSIS — R2689 Other abnormalities of gait and mobility: Secondary | ICD-10-CM | POA: Diagnosis not present

## 2017-03-10 NOTE — Patient Instructions (Addendum)
So not to have too much to do each day:  1. Perform the stretches every day.  2. Alternate the strengthening and neck exercises every other day- for example perform strengthening Monday, wed, and Friday. Perform      the neck/dizziness exercises on Tuesday, Thursday and Saturday.  3. Take Sunday off except for stretching.    Perform these exercises for strengthening:  Quadriceps: Sit to Stand    Sit on edge of chair, feet flat on floor and arms across your chest. Stand upright, extending knees fully. REMEMBER TO LEAN FORWARD AND SMELL THE ROSES! Sit back down slowly.  Repeat 10 times per set. Do _1_ sets per session. Do 1-2 sessions per day.  [email or web address]   Bridge    Lie on back, legs bent. Inhale, pressing hips up. Keeping ribs in, lengthen lower back.  Repeat _10___ times. Complete 1 set. Do __2-3__ sessions per day.  [email or web address]   Copyright  VHI. All rights reserved.     Hip sidHip Side Kick    Holding a chair/counter top for balance, keep legs shoulder width apart and toes pointed forward. Lift a leg out to side, keeping knee straight. Do not lean. Repeat using other leg, alternating legs. Repeat __10__ times. Do _1-2_ sessions per day.  [email or web address]   Copyright  VHI. All rights reserved.    Perform these exercises for flexibility: Healthy Back Stretch - Standing    With the counter and chair in front for stability: place one hand on stability and turn at waist to reach behind you with other arm. Turn head too to look at hand that is reaching behind you. Repeat with other side.  Do _10___ times each side. 1-2 times a day.  Copyright  VHI. All rights reserved.  Heel Cord Stretch    Place one leg forward, bent, other leg behind and straight. Lean forward keeping back heel flat. Hold __20__ seconds while counting out loud. Repeat with other leg. Repeat _3___ times. Do _2___ sessions per day.  [email or web address]    Copyright  VHI. All rights reserved.   HIP: Hamstrings - Supine    Place strap around foot. Raise leg up, keep knee straight. Hold ___ seconds. ___ reps per set, ___ sets per day, ___ days per week   Copyright  VHI. All rights reserved   Perform these exercises to help your dizziness/neck tightness: Active Neck Rotation    With head in a comfortable position and chin gently tucked in, rotate head to the right. Hold __10__ seconds. Repeat to the left. Repeat __5__ times. Do __2__ sessions per day.  [email or web address]  Copyright  VHI. All rights reserved.  Habituation - Tip Card  1.The goal of habituation training is to assist in decreasing symptoms of vertigo, dizziness, or nausea provoked by specific head and body motions. 2.These exercises may initially increase symptoms; however, be persistent and work through symptoms. With repetition and time, the exercises will assist in reducing or eliminating symptoms. 3.Exercises should be stopped and discussed with the therapist if you experience any of the following: - Sudden change or fluctuation in hearing - New onset of ringing in the ears, or increase in current intensity - Any fluid discharge from the ear - Severe pain in neck or back - Extreme nausea  Copyright  VHI. All rights reserved.   Gaze Stabilization: Sitting    Keeping eyes on target held in your hand and move head side to side for _30_ seconds. Do __2-3__ sessions per day.  Copyright  VHI. All rights reserved.  Gaze Stabilization: Tip Card  1.Target must remain in focus, not blurry, and appear stationary while head is in motion. 2.Perform exercises with small head movements (45 to either side of midline). 3.Increase speed of head motion so long as target is in focus. 4.If you wear eyeglasses, be sure you can see target through lens (therapist will give specific instructions for bifocal / progressive lenses). 5.These exercises may  provoke dizziness or nausea. Work through these symptoms. If too dizzy, slow head movement slightly. Rest between each exercise. 6.Exercises demand concentration; avoid distractions.  Copyright  VHI. All rights reserved.

## 2017-03-12 NOTE — Therapy (Signed)
[facility] [address] [place], [place], [address] Phone: [phone]   Fax:  [phone]  Physical Therapy Treatment  Patient Details  Name: Veronica Shaw MRN: [id number] Date of Birth: 08-26-1950 Referring Provider: Rogue Bussing   Encounter Date: 03/10/2017   03/10/17 1107  PT Visits / Re-Eval  Visit Number 7  Number of Visits 17  Date for PT Re-Evaluation 04/11/17  Authorization  Authorization Time Period Medicare g-code every 10th visit  PT Time Calculation  PT Start Time 1104  PT Stop Time 1145  PT Time Calculation (min) 41 min  PT - End of Session  Equipment Utilized During Treatment Gait belt  Activity Tolerance Patient tolerated treatment well  Behavior During Therapy [facility] for tasks assessed/performed     Past Medical History:  Diagnosis Date  . Asthma   . Chronic systolic CHF (congestive heart failure) ([place])    a. cMRI 4/15: EF 34% and findings - c/w NICM, normal RV size and function (RVEF 61%), Mild MR // b. Echo 2/15:  EF 30-35%, diff HK, ant-sept AK, Gr 2 DD, mild MR, trivial TR  //  c. Echo 5/17: EF 20-25%, severe diffuse HK, marked systolic dyssynchrony, grade 1 diastolic dysfunction, mild MR  //  d. RHC 5/17: Fick CO 2.9, RVSP 19, PASP 15, PW mean 2, low filing pressures and preserved CO   . Diabetes mellitus   . Gastritis   . History of echocardiogram    Echo 6/18: EF 30-35, diffuse HK, grade 1 diastolic dysfunction, trivial MR, mild LAE, mild TR, no pericardial effusion  . History of nuclear stress test    Myoview 5/18: EF 49, no ischemia, inferoseptal defect c/w LBBB artifact (intermediate risk due to EF < 50).  [name] Kitchen HTN (hypertension)   . Hyperlipidemia   . NICM (nonischemic cardiomyopathy) ([place])    a. Nuclear 5/13: Normal stress nuclear study. LV Ejection Fraction: 58%  //  b. LHC 10/14: Minor luminal irregularity in prox LAD, EF 35%   . Parkinson disease ([place])   . Plantar  fasciitis   . Sleep apnea    was retested and no longer had it and so d/c CPAP  . Urticaria     Past Surgical History:  Procedure Laterality Date  . BUNIONECTOMY    . CARDIAC CATHETERIZATION N/A 12/16/2015   Procedure: Right Heart Cath;  Surgeon: Sherren Mocha, MD;  Location: [place] CV LAB;  Service: Cardiovascular;  Laterality: N/A;  . CHOLECYSTECTOMY    . IMPLANTABLE CARDIOVERTER DEFIBRILLATOR IMPLANT  11-25-13   MDT dual chamber ICD implanted by [name] for primary prevention  . IMPLANTABLE CARDIOVERTER DEFIBRILLATOR IMPLANT N/A 11/25/2013   Procedure: IMPLANTABLE CARDIOVERTER DEFIBRILLATOR IMPLANT;  Surgeon: [name], MD;  Location: [facility] CATH LAB;  Service: Cardiovascular;  Laterality: N/A;  . LEFT AND RIGHT HEART CATHETERIZATION WITH CORONARY ANGIOGRAM N/A 05/31/2013   Procedure: LEFT AND RIGHT HEART CATHETERIZATION WITH CORONARY ANGIOGRAM;  Surgeon: [name], MD;  Location: [facility] CATH LAB;  Service: Cardiovascular;  Laterality: N/A;  . TONSILLECTOMY    . TUBAL LIGATION      There were no vitals filed for this visit.     03/10/17 1106  Symptoms/Limitations  Subjective No new complaints. No new falls. No pain, however does reports she started having an upset stomach right before leaving to come Pt also reporting confusion on HEP as she's been given ex's the last 3-4 sessiona and not sure which ones to do as  there are too many.   Pertinent History PD, CHF,DM  Limitations Standing;Walking  How long can you sit comfortably? 30 min  How long can you stand comfortably? 2-3 min  How long can you walk comfortably? 5 min  Patient Stated Goals stop leg cramps and falling; walk better  Pain Assessment  Currently in Pain? No/denies  Pain Score 0   issued the following to pt's updated HEP. Pt able to perform them in session today with cues on correct technique and hold times with stretches. So not to have too much to do each day:  1. Perform the stretches every day.  2.  Alternate the strengthening and neck exercises every other day- for example perform strengthening Monday, wed, and Friday. Perform      the neck/dizziness exercises on Tuesday, Thursday and Saturday.  3. Take Sunday off except for stretching.    Perform these exercises for strengthening:  Quadriceps: Sit to Stand    Sit on edge of chair, feet flat on floor and arms across your chest. Stand upright, extending knees fully. REMEMBER TO LEAN FORWARD AND SMELL THE ROSES! Sit back down slowly.  Repeat 10 times per set. Do _1_ sets per session. Do 1-2 sessions per day.  http://orth.exer.us/735   Bridge    Lie on back, legs bent. Inhale, pressing hips up. Keeping ribs in, lengthen lower back.  Repeat _10___ times. Complete 1 set. Do __2-3__ sessions per day.  http://pm.exer.us/55   Copyright  VHI. All rights reserved.     Hip sidHip Side Kick    Holding a chair/counter top for balance, keep legs shoulder width apart and toes pointed forward. Lift a leg out to side, keeping knee straight. Do not lean. Repeat using other leg, alternating legs. Repeat __10__ times. Do _1-2_ sessions per day.  http://[email or web address].exer.us/343   Copyright  VHI. All rights reserved.    Perform these exercises for flexibility: Healthy Back Stretch - Standing    With the counter and chair in front for stability: place one hand on stability and turn at waist to reach behind you with other arm. Turn head too to look at hand that is reaching behind you. Repeat with other side.  Do _10___ times each side. 1-2 times a day.  Copyright  VHI. All rights reserved.  Heel Cord Stretch    Place one leg forward, bent, other leg behind and straight. Lean forward keeping back heel flat. Hold __20__ seconds while counting out loud. Repeat with other leg. Repeat _3___ times. Do _2___ sessions per day.  http://[email or web address].exer.us/512   Copyright  VHI. All rights reserved.   HIP: Hamstrings - Supine    Place strap  around foot. Raise leg up, keep knee straight. Hold ___ seconds. ___ reps per set, ___ sets per day, ___ days per week   Copyright  VHI. All rights reserved   Perform these exercises to help your dizziness/neck tightness: Active Neck Rotation    With head in a comfortable position and chin gently tucked in, rotate head to the right. Hold __10__ seconds. Repeat to the left. Repeat __5__ times. Do __2__ sessions per day.  http://[email or web address].exer.us/11  Copyright  VHI. All rights reserved.  Habituation - Tip Card  1.The goal of habituation training is to assist in decreasing symptoms of vertigo, dizziness, or nausea provoked by specific head and body motions. 2.These exercises may initially increase symptoms; however, be persistent and work through symptoms. With repetition and time, the exercises will assist in reducing or eliminating symptoms. 3.Exercises  should be stopped and discussed with the therapist if you experience any of the following: - Sudden change or fluctuation in hearing - New onset of ringing in the ears, or increase in current intensity - Any fluid discharge from the ear - Severe pain in neck or back - Extreme nausea  Copyright  VHI. All rights reserved.   Gaze Stabilization: Sitting    Keeping eyes on target held in your hand and move head side to side for _30_ seconds. Do __2-3__ sessions per day.  Copyright  VHI. All rights reserved.  Gaze Stabilization: Tip Card  1.Target must remain in focus, not blurry, and appear stationary while head is in motion. 2.Perform exercises with small head movements (45 to either side of midline). 3.Increase speed of head motion so long as target is in focus. 4.If you wear eyeglasses, be sure you can see target through lens (therapist will give specific instructions for bifocal / progressive lenses). 5.These exercises may provoke dizziness or nausea. Work through these symptoms. If too dizzy, slow head movement  slightly. Rest between each exercise. 6.Exercises demand concentration; avoid distractions.  Copyright  VHI. All rights reserved.   03/10/17 1142  PT Education  Education provided Yes  Education Details revised HEP at pt's request and broke it down into sections so she is not overwhelmed with too much to do every day  Person(s) Educated Patient  Methods Explanation;Demonstration;Verbal cues  Comprehension Verbalized understanding         PT Short Term Goals - 03/03/17 1221      PT SHORT TERM GOAL #1   Title Pt will be able to tolerate 5 min of continuous activity in standing in order to safely manage self care activities   Baseline Patient performed standing walking and exercise >5 minutes nonstop demonstrating improved tolerance to standing self care tasks.   Time 2   Period Weeks   Status Achieved     PT SHORT TERM GOAL #2   Title Pt will be able to walk , level surface  500' with RPE < 3/10 in order to safely amb in home and short walks in [place] (03/14/17)   Baseline 115' is max effort  RPE  5/10   Time 4   Period Weeks   Status New     PT SHORT TERM GOAL #3   Title Pt will demonstrate functional understanding of energy conservation techniques ADL's    Baseline pt is overwhelmed and focused on  poor endurance    Time 4   Period Weeks           PT Long Term Goals - 03/03/17 1222      PT LONG TERM GOAL #1   Title Pt will demonstrate low fall risk standing and transitional mvts evident with Merrilee Jansky >52    Baseline 48/56   Time 8   Period Weeks   Status New   Target Date 04/11/17     PT LONG TERM GOAL #2   Title Pt will be able to walk for 15 minutes w/o loss of balance or RPE >3/10 using LRAD   Baseline amb 115' with SPC- laterally varying gait pattern   Time 8   Period Weeks   Target Date 04/11/17     PT LONG TERM GOAL #3   Title Pt will be independent with a high level HEP (ie. LSVT exs) with focus on core strength and  reciprocal mvt patterns to give her long term  maintenance of functional mvt patters   Baseline minimal focus on HEP and poor understanding of PD   Time 8   Period Weeks   Target Date 04/11/17        03/10/17 1108  Plan  Clinical Impression Statement Today's skilled session addressed HEP and revised again to reduce the amount the pt is doing every day. Reissued written program in folder and advised pt to toss her other papers, use only the ones in the folder as of today. Pt is progressing and should benefit from continued PT to progress toward unmet goals.   Pt will benefit from skilled therapeutic intervention in order to improve on the following deficits Decreased balance;Decreased endurance;Decreased mobility;Difficulty walking;Impaired sensation;Impaired perceived functional ability;Dizziness;Cardiopulmonary status limiting activity;Decreased activity tolerance;Decreased strength;Pain  PT Treatment/Interventions Functional mobility training;Stair training;Gait training;Therapeutic activities;Therapeutic exercise;Balance training;Neuromuscular re-education;Patient/family education;Manual techniques;Energy conservation;Vestibular  PT Next Visit Plan begin to incorporate PWR moves as tolerated. continue to work on L hip abductor strengthening, work on spinal rotation and decreasing en bloc movement, gait without device and improving confidence with mobility.  Consulted and Agree with Plan of Care Patient          Patient will benefit from skilled therapeutic intervention in order to improve the following deficits and impairments:  Decreased balance, Decreased endurance, Decreased mobility, Difficulty walking, Impaired sensation, Impaired perceived functional ability, Dizziness, Cardiopulmonary status limiting activity, Decreased activity tolerance, Decreased strength, Pain  Visit Diagnosis: Muscle weakness (generalized)  Unsteadiness on feet     Problem List Patient Active Problem  List   Diagnosis Date Noted  . Hair loss 07/21/2016  . Middle ear effusion, bilateral 04/28/2016  . Suprapubic pain 04/14/2016  . Allergic rhinitis 11/09/2015  . Upper airway cough syndrome 08/20/2015  . Osteopenia 07/10/2015  . Mild persistent asthma in adult without complication 53/64/6803  . Bunion, left foot 05/20/2014  . NICM (nonischemic cardiomyopathy) ([place]) 04/23/2014  . Arthritis or polyarthritis, rheumatoid ([place]) 03/12/2014  . ICD (implantable cardioverter-defibrillator), dual, in situ 03/06/2014  . Thoracic or lumbosacral neuritis or radiculitis, unspecified 07/03/2013  . Chronic systolic heart failure ([place]) 05/31/2013  . Parkinson disease ([place]) 05/21/2013  . 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 Major depression 03/31/2013  . Balance problem 03/03/2013  . Lower extremity edema 03/03/2013  . GERD (gastroesophageal reflux disease) 09/27/2012  . Blurry vision, bilateral 09/27/2012  . Dyspnea 09/19/2012  . Plantar fasciitis, bilateral 06/15/2012  . Cervical pain (neck) 04/12/2012  . 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 Diabetic peripheral neuropathy ([place]) 12/09/2011  . Hyperlipidemia 12/09/2011  . Sleep apnea 12/09/2011  . Fatigue 10/13/2011  . Chronic urticaria 05/27/2011  . 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 05/20/2011  . Sciatic leg pain 07/22/2009  . ROSACEA 05/29/2009  . ACHILLES BURSITIS OR TENDINITIS 03/27/2009  . Type II diabetes mellitus with complication ([place]) 21/22/4825  . Essential hypertension 09/28/2006    Willow Ora, PTA, [facility] [address], [place], [place] [address] [phone] 03/12/17, 6:38 PM   Name: KERRIANN KAMPHUIS MRN: [id number] Date of Birth: February 02, 1951

## 2017-03-14 ENCOUNTER — Ambulatory Visit: Payer: Medicare Other | Admitting: Physical Therapy

## 2017-03-17 ENCOUNTER — Ambulatory Visit: Payer: Medicare Other | Admitting: Physical Therapy

## 2017-03-21 ENCOUNTER — Ambulatory Visit (INDEPENDENT_AMBULATORY_CARE_PROVIDER_SITE_OTHER): Payer: Medicare Other

## 2017-03-21 ENCOUNTER — Telehealth: Payer: Self-pay

## 2017-03-21 ENCOUNTER — Encounter: Payer: Self-pay | Admitting: Internal Medicine

## 2017-03-21 DIAGNOSIS — Z95810 Presence of automatic (implantable) cardiac defibrillator: Secondary | ICD-10-CM | POA: Diagnosis not present

## 2017-03-21 DIAGNOSIS — I5022 Chronic systolic (congestive) heart failure: Secondary | ICD-10-CM

## 2017-03-21 NOTE — Telephone Encounter (Signed)
Routed letter to admin team. Thank you.

## 2017-03-21 NOTE — Telephone Encounter (Signed)
Patient needs a letter stating that she can not do jury duty because of her disabilities mailed to her address on file.Veronica Shaw

## 2017-03-22 ENCOUNTER — Encounter: Payer: Self-pay | Admitting: Physical Therapy

## 2017-03-22 ENCOUNTER — Ambulatory Visit: Payer: Medicare Other | Admitting: Physical Therapy

## 2017-03-22 DIAGNOSIS — R2681 Unsteadiness on feet: Secondary | ICD-10-CM

## 2017-03-22 DIAGNOSIS — R262 Difficulty in walking, not elsewhere classified: Secondary | ICD-10-CM

## 2017-03-22 DIAGNOSIS — R2689 Other abnormalities of gait and mobility: Secondary | ICD-10-CM | POA: Diagnosis not present

## 2017-03-22 DIAGNOSIS — R296 Repeated falls: Secondary | ICD-10-CM | POA: Diagnosis not present

## 2017-03-22 DIAGNOSIS — M6281 Muscle weakness (generalized): Secondary | ICD-10-CM | POA: Diagnosis not present

## 2017-03-22 NOTE — Therapy (Addendum)
[facility] [address] [place], [place], [address] Phone: [phone]   Fax:  [phone]  Physical Therapy Treatment  Patient Details  Name: Veronica Shaw MRN: [id number] Date of Birth: [birth year] Referring Provider: Rogue Bussing   Encounter Date: 03/22/2017      PT End of Session - 03/22/17 1800    Visit Number 8   Number of Visits 17   Date for PT Re-Evaluation 04/11/17   Authorization Time Period Medicare g-code every 10th visit   PT Start Time 1105   PT Stop Time 1145   PT Time Calculation (min) 40 min   Equipment Utilized During Treatment Gait belt   Activity Tolerance Patient tolerated treatment well   Behavior During Therapy [facility] for tasks assessed/performed      Past Medical History:  Diagnosis Date  . Asthma   . Chronic systolic CHF (congestive heart failure) ([place])    a. cMRI 4/15: EF 34% and findings - c/w NICM, normal RV size and function (RVEF 61%), Mild MR // b. Echo 2/15:  EF 30-35%, diff HK, ant-sept AK, Gr 2 DD, mild MR, trivial TR  //  c. Echo 5/17: EF 20-25%, severe diffuse HK, marked systolic dyssynchrony, grade 1 diastolic dysfunction, mild MR  //  d. RHC 5/17: Fick CO 2.9, RVSP 19, PASP 15, PW mean 2, low filing pressures and preserved CO   . Diabetes mellitus   . Gastritis   . History of echocardiogram    Echo 6/18: EF 30-35, diffuse HK, grade 1 diastolic dysfunction, trivial MR, mild LAE, mild TR, no pericardial effusion  . History of nuclear stress test    Myoview 5/18: EF 49, no ischemia, inferoseptal defect c/w LBBB artifact (intermediate risk due to EF < 50).  [name] Kitchen HTN (hypertension)   . Hyperlipidemia   . NICM (nonischemic cardiomyopathy) ([place])    a. Nuclear 5/13: Normal stress nuclear study. LV Ejection Fraction: 58%  //  b. LHC 10/14: Minor luminal irregularity in prox LAD, EF 35%   . Parkinson disease ([place])   . Plantar fasciitis   . Sleep apnea    was retested and no  longer had it and so d/c CPAP  . Urticaria     Past Surgical History:  Procedure Laterality Date  . BUNIONECTOMY    . CARDIAC CATHETERIZATION N/A 12/16/2015   Procedure: Right Heart Cath;  Surgeon: Sherren Mocha, MD;  Location: [place] CV LAB;  Service: Cardiovascular;  Laterality: N/A;  . CHOLECYSTECTOMY    . IMPLANTABLE CARDIOVERTER DEFIBRILLATOR IMPLANT  11-25-13   MDT dual chamber ICD implanted by [name] for primary prevention  . IMPLANTABLE CARDIOVERTER DEFIBRILLATOR IMPLANT N/A 11/25/2013   Procedure: IMPLANTABLE CARDIOVERTER DEFIBRILLATOR IMPLANT;  Surgeon: [name], MD;  Location[facility] C[facility] LAB;  Service: Cardiovascular;  Laterality: N/A;  . LEFT AND RIGHT HEART CATHETERIZATION WITH CORONARY ANGIOGRAM N/A 05/31/2013   Procedure: LEFT AND RIGHT HEART CATHETERIZATION WITH CORONARY ANGIOGRAM;  Surgeon: [name], MD;  Location: [facility] CATH LAB;  Service: Cardiovascular;  Laterality: N/A;  . TONSILLECTOMY    . TUBAL LIGATION      There were no vitals filed for this visit.      Subjective Assessment - 03/22/17 1743    Subjective Reports she is feeling better (including her dizziness and balance). She missed PT appointments due to new medicine that was giving her diarrhea (she has stopped taking it but not sure of the name). Wonders if she is  doing well enough for today to be her last appointment.  Wonders if her losses of balance backwards could be related to her Parkinson's (then re-states that she does not take Sinemet).    Pertinent History PD, CHF,DM   Limitations Standing;Walking   How long can you sit comfortably? 30 min   How long can you stand comfortably? 2-3 min   How long can you walk comfortably? 5 min   Patient Stated Goals stop leg cramps and falling; walk better   Currently in Pain? No/denies                         [facility] Adult PT Treatment/Exercise - 03/22/17 0001      Ambulation/Gait   Ambulation/Gait Yes   Ambulation/Gait  Assistance 6: Modified independent (Device/Increase time)   Ambulation/Gait Assistance Details talking throughout with no dyspnea noted   Ambulation Distance (Feet) 306 Feet   Assistive device Straight cane   Gait Pattern Step-through pattern;Decreased stride length;Trendelenburg;Lateral trunk lean to left;Right foot flat;Left foot flat   Ambulation Surface  Gait comments Level  RPE 1 at rest HR 79 SaO2 94%; after walking 300+ ft RPE 2 HR 80 SaO2 97%      Standardized Balance Assessment   Standardized Balance Assessment Berg Balance Test     Berg Balance Test   Standing Unsupported with Eyes Closed Able to stand 10 seconds with supervision   Standing Ubsupported with Feet Together Able to place feet together independently and stand for 1 minute with supervision   Standing Unsupported, Alternately Place Feet on Step/Stool Able to stand independently and safely and complete 8 steps in 20 seconds   Standing Unsupported, One Foot in [place] to plae foot ahead of the other independently and hold 30 seconds                PT Education - 03/22/17 1758    Education provided Yes   Education Details energy conservation techniques, increasing velocity and step length can improve her balance    Person(s) Educated Patient   Methods Explanation;Demonstration;Verbal cues;Handout   Comprehension Verbalized understanding;Returned demonstration;Verbal cues required;Need further instruction          PT Short Term Goals - 03/22/17 1747      PT SHORT TERM GOAL #1   Title Pt will be able to tolerate 5 min of continuous activity in standing in order to safely manage self care activities   Baseline Patient performed standing walking and exercise >5 minutes nonstop demonstrating improved tolerance to standing self care tasks.   Time 2   Period Weeks   Status Achieved     PT SHORT TERM GOAL #2   Title Pt will be able to walk , level surface  500' with RPE < 3/10 in order to safely amb in  home and short walks in [place] (03/14/17)   Baseline 115' is max effort  RPE  5/10  8/22 306 ft, RPE 2/10   Time 4   Period Weeks   Status Partially Met     PT SHORT TERM GOAL #3   Title Pt will demonstrate functional understanding of energy conservation techniques ADL's    Baseline pt is overwhelmed and focused on  poor endurance 8/22 reviewed energy conservation handout and answered questions as able   Time 4   Period Weeks   Status On-going           PT Long  Term Goals - 03/03/17 1222      PT LONG TERM GOAL #1   Title Pt will demonstrate low fall risk standing and transitional mvts evident with Merrilee Jansky >52    Baseline 48/56   Time 8   Period Weeks   Status New   Target Date 04/11/17     PT LONG TERM GOAL #2   Title Pt will be able to walk for 15 minutes w/o loss of balance or RPE >3/10 using LRAD   Baseline amb 115' with SPC- laterally varying gait pattern   Time 8   Period Weeks   Target Date 04/11/17     PT LONG TERM GOAL #3   Title Pt will be independent with a high level HEP (ie. LSVT exs) with focus on core strength and reciprocal mvt patterns to give her long term maintenance of functional mvt patters   Baseline minimal focus on HEP and poor understanding of PD   Time 8   Period Weeks   Target Date 04/11/17               Plan - 03/22/17 1801    Clinical Impression Statement Discussed missed/cancelled appts (due to illness), reviewed HEP with pt reporting she only does a few of these exercises, and asessed STGs. Patient met 1of 3 goals, the other 2 are partially met or ongoing. Emphasixed need to prioritize VOR x1 exercises and really try to get them in 3x/wk.    Rehab Potential Good   Clinical Impairments Affecting Rehab Potential LBP, endurance, balance deficit   PT Frequency 2x / week   PT Duration 8 weeks   PT Treatment/Interventions Functional mobility training;Stair training;Gait training;Therapeutic  activities;Therapeutic exercise;Balance training;Neuromuscular re-education;Patient/family education;Manual techniques;Energy conservation;Vestibular   PT Next Visit Plan begin to incorporate PWR moves as tolerated-watch for retropulsion. continue to work on L hip abductor strengthening, work on spinal rotation and decreasing en bloc movement, gait without device and improving confidence with mobility.   Consulted and Agree with Plan of Care Patient      Patient will benefit from skilled therapeutic intervention in order to improve the following deficits and impairments:  Decreased balance, Decreased endurance, Decreased mobility, Difficulty walking, Impaired sensation, Impaired perceived functional ability, Dizziness, Cardiopulmonary status limiting activity, Decreased activity tolerance, Decreased strength, Pain  Visit Diagnosis: Unsteadiness on feet  Other abnormalities of gait and mobility  Difficulty in walking, not elsewhere classified     Problem List Patient Active Problem List   Diagnosis Date Noted  . Hair loss 07/21/2016  . Middle ear effusion, bilateral 04/28/2016  . Suprapubic pain 04/14/2016  . Allergic rhinitis 11/09/2015  . Upper airway cough syndrome 08/20/2015  . Osteopenia 07/10/2015  . Mild persistent asthma in adult without complication 50/56/9794  . Bunion, left foot 05/20/2014  . NICM (nonischemic cardiomyopathy) ([place]) 04/23/2014  . Arthritis or polyarthritis, rheumatoid ([place]) 03/12/2014  . ICD (implantable cardioverter-defibrillator), dual, in situ 03/06/2014  . Thoracic or lumbosacral neuritis or radiculitis, unspecified 07/03/2013  . Chronic systolic heart failure ([place]) 05/31/2013  . Parkinson disease ([place]) 05/21/2013  . 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 Major depression 03/31/2013  . Balance problem 03/03/2013  . Lower extremity edema 03/03/2013  . GERD (gastroesophageal reflux disease) 09/27/2012  . Blurry vision, bilateral  09/27/2012  . Dyspnea 09/19/2012  . Plantar fasciitis, bilateral 06/15/2012  . Cervical pain (neck) 04/12/2012  . 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 Diabetic peripheral neuropathy ([place]) 12/09/2011  . Hyperlipidemia 12/09/2011  . Sleep apnea 12/09/2011  .  Fatigue 10/13/2011  . Chronic urticaria 05/27/2011  . 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 05/20/2011  . Sciatic leg pain 07/22/2009  . ROSACEA 05/29/2009  . ACHILLES BURSITIS OR TENDINITIS 03/27/2009  . Type II diabetes mellitus with complication ([place]) 15/50/2714  . Essential hypertension 09/28/2006    Rexanne Mano, PT 03/22/2017, 6:07 PM  [facility] [address] [place], [place], [address] Phone: [phone]   Fax:  [phone]  Name: LEIGHTYN CINA MRN: [id number] Date of Birth: 12-13-1950

## 2017-03-24 ENCOUNTER — Telehealth: Payer: Self-pay

## 2017-03-24 ENCOUNTER — Encounter: Payer: Self-pay | Admitting: Physical Therapy

## 2017-03-24 ENCOUNTER — Ambulatory Visit: Payer: Medicare Other | Admitting: Physical Therapy

## 2017-03-24 VITALS — BP 112/63 | HR 69

## 2017-03-24 DIAGNOSIS — R2681 Unsteadiness on feet: Secondary | ICD-10-CM

## 2017-03-24 DIAGNOSIS — R262 Difficulty in walking, not elsewhere classified: Secondary | ICD-10-CM

## 2017-03-24 DIAGNOSIS — R2689 Other abnormalities of gait and mobility: Secondary | ICD-10-CM | POA: Diagnosis not present

## 2017-03-24 DIAGNOSIS — M6281 Muscle weakness (generalized): Secondary | ICD-10-CM | POA: Diagnosis not present

## 2017-03-24 DIAGNOSIS — R296 Repeated falls: Secondary | ICD-10-CM | POA: Diagnosis not present

## 2017-03-24 NOTE — Progress Notes (Signed)
EPIC Encounter for ICM Monitoring  Patient Name: Veronica Shaw is a 66 y.o. female Date: 03/24/2017 Primary Care Physican: Rogue Bussing, MD Primary Cardiologist:Cooper/Weaver, PA Electrophysiologist: Druscilla Brownie Weight:unknown          Attempted call to patient and unable to reach.  Left message to return call regarding transmission.  Transmission reviewed.    Thoracic impedance abnormal suggesting fluid accumulation since 03/05/2017.  Prescribed dosage: Furosemide 20 mg 1 tablet daily as needed.  Potassium 10 mEq  1 tablet daily as needed (taken with Lasix).   Labs: 01/25/2017 Creatinine 1.17, BUN 22, Potassium 4.5, Sodium 142, EGFR 48-56 12/28/2016 Creatinine 1.05, BUN 20, Potassium 4.2, Sodium 143, EGFR 55-64, NT-Pro BNP 244 12/21/2016 Creatinine 1.33, BUN 24, Potassium 5.1, Sodium 140 07/21/2016 Creatinine 1.18, BUN 17, Potassium 4.1, Sodium 139, EGFR 48-55  Recommendations: NONE - Unable to reach patient   Follow-up plan: ICM clinic phone appointment on 03/30/2017 (manual send) to recheck fluid levels.  Office appointment scheduled 05/26/2017 with Dr. Lovena Le.  Copy of ICM check sent to Richardson Dopp, [place], Dr Burt Knack and Dr. Lovena Le.   3 month ICM trend: 03/21/2017   1 Year ICM trend:      [name], RN 03/24/2017 11:32 AM

## 2017-03-24 NOTE — Telephone Encounter (Signed)
Remote ICM transmission received.  Attempted patient call and left message to return call regarding transmission.    

## 2017-03-25 NOTE — Therapy (Signed)
[facility] [address] [place], [place], [address] Phone: [phone]   Fax:  [phone]  Physical Therapy Treatment  Patient Details  Name: Veronica Shaw MRN: [id number] Date of Birth: 05-25-1951 Referring Provider: Rogue Bussing   Encounter Date: 03/24/2017      PT End of Session - 03/24/17 1105    Visit Number 9   Number of Visits 17   Date for PT Re-Evaluation 04/11/17   Authorization Time Period Medicare g-code every 10th visit   PT Start Time 1103   PT Stop Time 1145   PT Time Calculation (min) 42 min   Equipment Utilized During Treatment Gait belt   Activity Tolerance Patient tolerated treatment well   Behavior During Therapy [facility] for tasks assessed/performed      Past Medical History:  Diagnosis Date  . Asthma   . Chronic systolic CHF (congestive heart failure) ([place])    a. cMRI 4/15: EF 34% and findings - c/w NICM, normal RV size and function (RVEF 61%), Mild MR // b. Echo 2/15:  EF 30-35%, diff HK, ant-sept AK, Gr 2 DD, mild MR, trivial TR  //  c. Echo 5/17: EF 20-25%, severe diffuse HK, marked systolic dyssynchrony, grade 1 diastolic dysfunction, mild MR  //  d. RHC 5/17: Fick CO 2.9, RVSP 19, PASP 15, PW mean 2, low filing pressures and preserved CO   . Diabetes mellitus   . Gastritis   . History of echocardiogram    Echo 6/18: EF 30-35, diffuse HK, grade 1 diastolic dysfunction, trivial MR, mild LAE, mild TR, no pericardial effusion  . History of nuclear stress test    Myoview 5/18: EF 49, no ischemia, inferoseptal defect c/w LBBB artifact (intermediate risk due to EF < 50).  [name] Kitchen HTN (hypertension)   . Hyperlipidemia   . NICM (nonischemic cardiomyopathy) ([place])    a. Nuclear 5/13: Normal stress nuclear study. LV Ejection Fraction: 58%  //  b. LHC 10/14: Minor luminal irregularity in prox LAD, EF 35%   . Parkinson disease ([place])   . Plantar fasciitis   . Sleep apnea    was retested and no  longer had it and so d/c CPAP  . Urticaria     Past Surgical History:  Procedure Laterality Date  . BUNIONECTOMY    . CARDIAC CATHETERIZATION N/A 12/16/2015   Procedure: Right Heart Cath;  Surgeon: Sherren Mocha, MD;  Location: [facility];  Service: Cardiovascular;  Laterality: N/A;  . CHOLECYSTECTOMY    . IMPLANTABLE CARDIOVERTER DEFIBRILLATOR IMPLANT  11-25-13   MDT dual chamber ICD implanted by [name] for primary prevention  . IMPLANTABLE CARDIOVERTER DEFIBRILLATOR IMPLANT N/A 11/25/2013   Procedure: IMPLANTABLE CARDIOVERTER DEFIBRILLATOR IMPLANT;  Surgeon: [name], MD;  Location: [facility] CATH LAB;  Service: Cardiovascular;  Laterality: N/A;  . LEFT AND RIGHT HEART CATHETERIZATION WITH CORONARY ANGIOGRAM N/A 05/31/2013   Procedure: LEFT AND RIGHT HEART CATHETERIZATION WITH CORONARY ANGIOGRAM;  Surgeon: [name], MD;  Location: [facility] CATH LAB;  Service: Cardiovascular;  Laterality: N/A;  . TONSILLECTOMY    . TUBAL LIGATION      Vitals:   03/24/17 1104  BP: 112/63  Pulse: 69        Subjective Assessment - 03/24/17 1104    Subjective No new complaints. No falls to report. Does have lower leg soreness in both legs.    Pertinent History PD, CHF,DM   Limitations Standing;Walking   How long can you sit  comfortably? 30 min   How long can you stand comfortably? 2-3 min   How long can you walk comfortably? 5 min   Patient Stated Goals stop leg cramps and falling; walk better   Currently in Pain? Yes   Pain Score 4    Pain Location Leg   Pain Orientation Right;Left   Pain Descriptors / Indicators Aching;Sore   Pain Type Chronic pain   Pain Onset More than a month ago   Pain Frequency Constant   Aggravating Factors  increased activity   Pain Relieving Factors rest             [facility] Adult PT Treatment/Exercise - 03/24/17 1111      Transfers   Transfers Sit to Stand;Stand to Sit   Sit to Stand 6: Modified independent (Device/Increase time);With upper  extremity assist   Stand to Sit 6: Modified independent (Device/Increase time);With upper extremity assist     Ambulation/Gait   Ambulation/Gait Yes   Ambulation/Gait Assistance 6: Modified independent (Device/Increase time);5: Supervision   Ambulation/Gait Assistance Details supervision on paved outdoor surfaces, Mod I on indoor level surfaces   Ambulation Distance (Feet) 1000 Feet   Assistive device Straight cane   Gait Pattern Step-through pattern;Decreased stride length;Trendelenburg;Lateral trunk lean to left;Right foot flat;Left foot flat   Ambulation Surface Level;Unlevel;Indoor;Outdoor;Paved   Gait Comments RPE 4-5 after gait outdoors, no increase in pain or significant shortnes sof breath reported.                       PWR [facility]) - 03/24/17 1128    PWR! Up 10   PWR! Rock 10   PWR! Twist 10   PWR Step 10   Comments cues needed for correct movement pattern/ex form and technique. modifited PWR! Rock to reaching out laterally vs up bil sides due to pt with limited shoulder flexion due to shoulder issues/pain.              PT Short Term Goals - 03/22/17 1747      PT SHORT TERM GOAL #1   Title Pt will be able to tolerate 5 min of continuous activity in standing in order to safely manage self care activities   Baseline Patient performed standing walking and exercise >5 minutes nonstop demonstrating improved tolerance to standing self care tasks.   Time 2   Period Weeks   Status Achieved     PT SHORT TERM GOAL #2   Title Pt will be able to walk , level surface  500' with RPE < 3/10 in order to safely amb in home and short walks in [place] (03/14/17)   Baseline 115' is max effort  RPE  5/10  8/22 306 ft, RPE 2/10   Time 4   Period Weeks   Status Partially Met     PT SHORT TERM GOAL #3   Title Pt will demonstrate functional understanding of energy conservation techniques ADL's    Baseline pt is overwhelmed and focused on  poor endurance  8/22 reviewed energy conservation handout and answered questions as able   Time 4   Period Weeks   Status On-going           PT Long Term Goals - 03/03/17 1222      PT LONG TERM GOAL #1   Title Pt will demonstrate low fall risk standing and transitional mvts evident with Berg >52    Baseline 48/56   Time 8  Period Weeks   Status New   Target Date 04/11/17     PT LONG TERM GOAL #2   Title Pt will be able to walk for 15 minutes w/o loss of balance or RPE >3/10 using LRAD   Baseline amb 115' with SPC- laterally varying gait pattern   Time 8   Period Weeks   Target Date 04/11/17     PT LONG TERM GOAL #3   Title Pt will be independent with a high level HEP (ie. LSVT exs) with focus on core strength and reciprocal mvt patterns to give her long term maintenance of functional mvt patters   Baseline minimal focus on HEP and poor understanding of PD   Time 8   Period Weeks   Target Date 04/11/17            Plan - 03/24/17 1106    Clinical Impression Statement Today's skilled session continued to address gait on various surfaces along with activity tolerance. Remainder of session focused on initiation of standing PWR! exercises for balance and strengthening with emphasis on big movements. Pt is progressing toward goals and should benefit from continued PT to progress toward unmet goals.    Rehab Potential Good   Clinical Impairments Affecting Rehab Potential LBP, endurance, balance deficit   PT Frequency 2x / week   PT Duration 8 weeks   PT Treatment/Interventions Functional mobility training;Stair training;Gait training;Therapeutic activities;Therapeutic exercise;Balance training;Neuromuscular re-education;Patient/family education;Manual techniques;Energy conservation;Vestibular   PT Next Visit Plan G-code next visit. begin to incorporate PWR moves as tolerated-watch for retropulsion. continue to work on L hip abductor strengthening, work on spinal rotation and decreasing en  bloc movement, gait without device and improving confidence with mobility.   Consulted and Agree with Plan of Care Patient      Patient will benefit from skilled therapeutic intervention in order to improve the following deficits and impairments:  Decreased balance, Decreased endurance, Decreased mobility, Difficulty walking, Impaired sensation, Impaired perceived functional ability, Dizziness, Cardiopulmonary status limiting activity, Decreased activity tolerance, Decreased strength, Pain  Visit Diagnosis: Unsteadiness on feet  Other abnormalities of gait and mobility  Difficulty in walking, not elsewhere classified  Muscle weakness (generalized)  Repeated falls     Problem List Patient Active Problem List   Diagnosis Date Noted  . Hair loss 07/21/2016  . Middle ear effusion, bilateral 04/28/2016  . Suprapubic pain 04/14/2016  . Allergic rhinitis 11/09/2015  . Upper airway cough syndrome 08/20/2015  . Osteopenia 07/10/2015  . Mild persistent asthma in adult without complication 09/62/8366  . Bunion, left foot 05/20/2014  . NICM (nonischemic cardiomyopathy) ([place]) 04/23/2014  . Arthritis or polyarthritis, rheumatoid ([place]) 03/12/2014  . ICD (implantable cardioverter-defibrillator), dual, in situ 03/06/2014  . Thoracic or lumbosacral neuritis or radiculitis, unspecified 07/03/2013  . Chronic systolic heart failure ([place]) 05/31/2013  . Parkinson disease ([place]) 05/21/2013  . 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 Major depression 03/31/2013  . Balance problem 03/03/2013  . Lower extremity edema 03/03/2013  . GERD (gastroesophageal reflux disease) 09/27/2012  . Blurry vision, bilateral 09/27/2012  . Dyspnea 09/19/2012  . Plantar fasciitis, bilateral 06/15/2012  . Cervical pain (neck) 04/12/2012  . 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 Diabetic peripheral neuropathy ([place]) 12/09/2011  . Hyperlipidemia 12/09/2011  . Sleep apnea 12/09/2011  . Fatigue 10/13/2011   . Chronic urticaria 05/27/2011  . 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 05/20/2011  . Sciatic leg pain 07/22/2009  . ROSACEA 05/29/2009  . ACHILLES BURSITIS OR TENDINITIS 03/27/2009  . Type II diabetes  mellitus with complication ([place]) 48/08/6551  . Essential hypertension 09/28/2006    Willow Ora, PTA, [facility] [address], [place], [place] [address] [phone] 03/25/17, 1:21 PM   Name: Veronica Shaw MRN: [id number] Date of Birth: 1951/01/03

## 2017-03-26 NOTE — Progress Notes (Signed)
Take Lasix and potassium for 3 days, then resume usual schedule BMET 1 week Recheck with ICM clinic as planned. Richardson Dopp, PA-C    03/26/2017 9:05 PM

## 2017-03-27 NOTE — Addendum Note (Signed)
Addended by: [name] on: 03/27/2017 10:44 AM   Modules accepted: Orders

## 2017-03-27 NOTE — Progress Notes (Signed)
Attempted call back to patient and left message to return call.  

## 2017-03-27 NOTE — Progress Notes (Addendum)
Call back to patient and advised Richardson Dopp, PA recommended she take her dosage of Lasix 20 mg 1 tablet x 3 days with Potassium 10 meq 1 tablet x 3 days and then return to PRN dosage.  Advised to have BMET drawn in a week, she will have drawn on 04/04/2017.  She said she has had a little shortness of breath lately.  Recheck fluid levels on 03/30/2017.  She verbalized understanding.

## 2017-03-28 ENCOUNTER — Other Ambulatory Visit: Payer: Self-pay | Admitting: Internal Medicine

## 2017-03-28 DIAGNOSIS — Z1231 Encounter for screening mammogram for malignant neoplasm of breast: Secondary | ICD-10-CM

## 2017-03-29 ENCOUNTER — Ambulatory Visit: Payer: Medicare Other | Admitting: Physical Therapy

## 2017-03-29 ENCOUNTER — Encounter: Payer: Self-pay | Admitting: Physical Therapy

## 2017-03-29 DIAGNOSIS — R2681 Unsteadiness on feet: Secondary | ICD-10-CM

## 2017-03-29 DIAGNOSIS — R296 Repeated falls: Secondary | ICD-10-CM | POA: Diagnosis not present

## 2017-03-29 DIAGNOSIS — R262 Difficulty in walking, not elsewhere classified: Secondary | ICD-10-CM

## 2017-03-29 DIAGNOSIS — M6281 Muscle weakness (generalized): Secondary | ICD-10-CM | POA: Diagnosis not present

## 2017-03-29 DIAGNOSIS — R2689 Other abnormalities of gait and mobility: Secondary | ICD-10-CM

## 2017-03-30 ENCOUNTER — Telehealth: Payer: Self-pay | Admitting: Internal Medicine

## 2017-03-30 ENCOUNTER — Ambulatory Visit (INDEPENDENT_AMBULATORY_CARE_PROVIDER_SITE_OTHER): Payer: Self-pay

## 2017-03-30 DIAGNOSIS — Z95810 Presence of automatic (implantable) cardiac defibrillator: Secondary | ICD-10-CM

## 2017-03-30 DIAGNOSIS — I5022 Chronic systolic (congestive) heart failure: Secondary | ICD-10-CM

## 2017-03-30 NOTE — Telephone Encounter (Signed)
The letter you wrote is not signed by PCP.  Please sign it and call patient to let them know when it is ready again, asap please.  Thanks.  It is in team folder to be signed.

## 2017-03-30 NOTE — Progress Notes (Signed)
EPIC Encounter for ICM Monitoring  Patient Name: Veronica Shaw is a 66 y.o. female Date: 03/30/2017 Primary Care Physican: Rogue Bussing, MD Primary Cardiologist:Cooper/Weaver, PA Electrophysiologist: Druscilla Brownie Weight:unknown      Heart Failure questions reviewed, pt asymptomatic.   Thoracic impedance returned to normal after taking 3 days of PRN Furosemide and Potassium.  Prescribed dosage: Furosemide 20 mg 1 tablet daily as needed. Potassium 10 mEq 1 tablet daily as needed (taken with Lasix).   Labs: 01/25/2017 Creatinine 1.17, BUN 22, Potassium 4.5, Sodium 142, EGFR 48-56 12/28/2016 Creatinine 1.05, BUN 20, Potassium 4.2, Sodium 143, EGFR 55-64, NT-Pro BNP 244 12/21/2016 Creatinine 1.33, BUN 24, Potassium 5.1, Sodium 140 07/21/2016 Creatinine 1.18, BUN 17, Potassium 4.1, Sodium 139, EGFR 48-55  Recommendations: No changes.   Encouraged to call for fluid symptoms.  Follow-up plan: ICM clinic phone appointment on 05/18/2017.   Office appointment scheduled 05/26/2017 with Dr. Lovena Le and 06/19/2017 with Dr Burt Knack.  Copy of ICM check sent to Dr. Lovena Le, Richardson Dopp, PA.   3 month ICM trend: 03/30/2017   1 Year ICM trend:      [name], RN 03/30/2017 10:57 AM

## 2017-03-30 NOTE — Telephone Encounter (Signed)
Patient called stating she did not receive the letter for jury duty excuse. Patient advised that the letter was approved and nurse will print out and place up front. Patient also needed a copy of her problem list attached to letter. Problem list printed.  Derl Barrow, RN

## 2017-03-30 NOTE — Telephone Encounter (Signed)
Letter placed in PCP box for signature.

## 2017-03-30 NOTE — Therapy (Addendum)
[facility] [address] [place], [place], [address] Phone: [phone]   Fax:  [phone]  Physical Therapy Treatment and Discharge Summary  Patient Details  Name: Veronica Shaw MRN: [id number] Date of Birth: [birth year] Referring Provider: Rogue Bussing   Encounter Date: 03/29/2017   03/29/17 1023  PT Visits / Re-Eval  Visit Number 10  Number of Visits 17  Date for PT Re-Evaluation 04/11/17  Authorization  Authorization Time Period Medicare g-code every 10th visit  PT Time Calculation  PT Start Time 1020  PT Stop Time 1102  PT Time Calculation (min) 42 min  PT - End of Session  Equipment Utilized During Treatment Gait belt  Activity Tolerance Patient tolerated treatment well  Behavior During Therapy [facility] for tasks assessed/performed     Past Medical History:  Diagnosis Date  . Asthma   . Chronic systolic CHF (congestive heart failure) ([place])    a. cMRI 4/15: EF 34% and findings - c/w NICM, normal RV size and function (RVEF 61%), Mild MR // b. Echo 2/15:  EF 30-35%, diff HK, ant-sept AK, Gr 2 DD, mild MR, trivial TR  //  c. Echo 5/17: EF 20-25%, severe diffuse HK, marked systolic dyssynchrony, grade 1 diastolic dysfunction, mild MR  //  d. RHC 5/17: Fick CO 2.9, RVSP 19, PASP 15, PW mean 2, low filing pressures and preserved CO   . Diabetes mellitus   . Gastritis   . History of echocardiogram    Echo 6/18: EF 30-35, diffuse HK, grade 1 diastolic dysfunction, trivial MR, mild LAE, mild TR, no pericardial effusion  . History of nuclear stress test    Myoview 5/18: EF 49, no ischemia, inferoseptal defect c/w LBBB artifact (intermediate risk due to EF < 50).  [name] Kitchen HTN (hypertension)   . Hyperlipidemia   . NICM (nonischemic cardiomyopathy) ([place])    a. Nuclear 5/13: Normal stress nuclear study. LV Ejection Fraction: 58%  //  b. LHC 10/14: Minor luminal irregularity in prox LAD, EF 35%   . Parkinson disease  ([place])   . Plantar fasciitis   . Sleep apnea    was retested and no longer had it and so d/c CPAP  . Urticaria     Past Surgical History:  Procedure Laterality Date  . BUNIONECTOMY    . CARDIAC CATHETERIZATION N/A 12/16/2015   Procedure: Right Heart Cath;  Surgeon: Sherren Mocha, MD;  Location: [place] [facility];  Service: Cardiovascular;  Laterality: N/A;  . CHOLECYSTECTOMY    . IMPLANTABLE CARDIOVERTER DEFIBRILLATOR IMPLANT  11-25-13   MDT dual chamber ICD implanted by [name] for primary prevention  . IMPLANTABLE CARDIOVERTER DEFIBRILLATOR IMPLANT N/A 11/25/2013   Procedure: IMPLANTABLE CARDIOVERTER DEFIBRILLATOR IMPLANT;  Surgeon: [name], MD;  Location[facility] CATH LAB;  Service: Cardiovascular;  Laterality: N/A;  . LEFT AND RIGHT HEART CATHETERIZATION WITH CORONARY ANGIOGRAM N/A 05/31/2013   Procedure: LEFT AND RIGHT HEART CATHETERIZATION WITH CORONARY ANGIOGRAM;  Surgeon: [name], MD;  Location: [facility] CATH LAB;  Service: Cardiovascular;  Laterality: N/A;  . TONSILLECTOMY    . TUBAL LIGATION      There were no vitals filed for this visit.     03/29/17 1022  Symptoms/Limitations  Subjective No new complaints. No falls to report. Does have lower leg soreness in both legs.   Pertinent History PD, CHF,DM  Limitations Standing;Walking  How long can you sit comfortably? 30 min  How long can you stand comfortably? 2-3 min  How long can you walk comfortably? 5 min  Patient Stated Goals stop leg cramps and falling; walk better  Pain Assessment  Currently in Pain? No/denies  Pain Score 0      03/29/17 1024  Transfers  Transfers Sit to Stand;Stand to Sit  Sit to Stand 6: Modified independent (Device/Increase time);With upper extremity assist  Stand to Sit 6: Modified independent (Device/Increase time);With upper extremity assist  Ambulation/Gait  Ambulation/Gait Yes  Ambulation/Gait Assistance 6: Modified independent (Device/Increase time)  Ambulation/Gait  Assistance Details gait around gym working toward goal of 15 minutes. pt needed seated rest break at 12.00.50 minutes.  Assistive device Straight cane  Gait Pattern Step-through pattern;Decreased stride length;Trendelenburg;Lateral trunk lean to left;Right foot flat;Left foot flat  Ambulation Surface Level;Indoor  Gait Comments RPE after gait was 2 with no increase in pain reported or shortness of breath, only fatigue.   Berg Balance Test  Sit to Stand 4  Standing Unsupported 4  Sitting with Back Unsupported but Feet Supported on Floor or Stool 4  Stand to Sit 4  Transfers 4  Standing Unsupported with Eyes Closed 4  Standing Ubsupported with Feet Together 4  From Standing, Reach Forward with Outstretched Arm 3 (8 inches)  From Standing Position, Pick up Object from Floor 4  From Standing Position, Turn to Look Behind Over each Shoulder 4  Turn 360 Degrees 3  Standing Unsupported, Alternately Place Feet on Step/Stool 4  Standing Unsupported, One Foot in Front 3  Standing on One Leg 4  Total Score 53           PT Short Term Goals - 03/22/17 1747      PT SHORT TERM GOAL #1   Title Pt will be able to tolerate 5 min of continuous activity in standing in order to safely manage self care activities   Baseline Patient performed standing walking and exercise >5 minutes nonstop demonstrating improved tolerance to standing self care tasks.   Time 2   Period Weeks   Status Achieved     PT SHORT TERM GOAL #2   Title Pt will be able to walk , level surface  500' with RPE < 3/10 in order to safely amb in home and short walks in [place] (03/14/17)   Baseline 115' is max effort  RPE  5/10  8/22 306 ft, RPE 2/10   Time 4   Period Weeks   Status Partially Met     PT SHORT TERM GOAL #3   Title Pt will demonstrate functional understanding of energy conservation techniques ADL's    Baseline pt is overwhelmed and focused on  poor endurance 8/22 reviewed energy  conservation handout and answered questions as able   Time 4   Period Weeks   Status On-going           PT Long Term Goals - 03/29/17 1023      PT LONG TERM GOAL #1   Title Pt will demonstrate low fall risk standing and transitional mvts evident with Berg >52    Baseline 03/29/17: 53/56   Time --   Period --   Status Achieved     PT LONG TERM GOAL #2   Title Pt will be able to walk for 15 minutes w/o loss of balance or RPE >3/10 using LRAD   Baseline 03/29/17: pt able to ambulate just over 12 minutes before needing a rest break due to faitgue, no shortness of breath with RPE=1-2/10. improved  from eval, just not to goal      Time --   Period --   Status Partially Met     PT LONG TERM GOAL #3   Title Pt will be independent with a high level HEP (ie. LSVT exs) with focus on core strength and reciprocal mvt patterns to give her long term maintenance of functional mvt patters   Baseline 03/29/17: pt has been introduced to PWR! exercises, however she is not independent with them. She has been given information on Parkinson's group meetings and community ex classes to check into.                                                     Time --   Period --   Status Partially Met           03/29/17 1023  Plan  Clinical Impression Statement Pt arrived today requesting this be her last visist as she is having car trouble and does not feel she can continue coming to PT. Pt has met 1/3 LTGs and partially met the other 2 LTGs to date. Pt was provided information on Parkinsons community support group and on community Parkinson's exercise classes.   Pt will benefit from skilled therapeutic intervention in order to improve on the following deficits Decreased balance;Decreased endurance;Decreased mobility;Difficulty walking;Impaired sensation;Impaired perceived functional ability;Dizziness;Cardiopulmonary status limiting activity;Decreased activity tolerance;Decreased strength;Pain  Rehab Potential  Good  Clinical Impairments Affecting Rehab Potential LBP, endurance, balance deficit  PT Frequency 2x / week  PT Duration 8 weeks  PT Treatment/Interventions Functional mobility training;Stair training;Gait training;Therapeutic activities;Therapeutic exercise;Balance training;Neuromuscular re-education;Patient/family education;Manual techniques;Energy conservation;Vestibular  PT Next Visit Plan discharge per patient request  Consulted and Agree with Plan of Care Patient        Patient will benefit from skilled therapeutic intervention in order to improve the following deficits and impairments:  Decreased balance, Decreased endurance, Decreased mobility, Difficulty walking, Impaired sensation, Impaired perceived functional ability, Dizziness, Cardiopulmonary status limiting activity, Decreased activity tolerance, Decreased strength, Pain  Visit Diagnosis: Unsteadiness on feet  Other abnormalities of gait and mobility  Difficulty in walking, not elsewhere classified  Muscle weakness (generalized)  Repeated falls     Problem List Patient Active Problem List   Diagnosis Date Noted  . Hair loss 07/21/2016  . Middle ear effusion, bilateral 04/28/2016  . Suprapubic pain 04/14/2016  . Allergic rhinitis 11/09/2015  . Upper airway cough syndrome 08/20/2015  . Osteopenia 07/10/2015  . Mild persistent asthma in adult without complication 49/75/3005  . Bunion, left foot 05/20/2014  . NICM (nonischemic cardiomyopathy) ([place]) 04/23/2014  . Arthritis or polyarthritis, rheumatoid ([place]) 03/12/2014  . ICD (implantable cardioverter-defibrillator), dual, in situ 03/06/2014  . Thoracic or lumbosacral neuritis or radiculitis, unspecified 07/03/2013  . Chronic systolic heart failure ([place]) 05/31/2013  . Parkinson disease ([place]) 05/21/2013  . 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 Major depression 03/31/2013  . Balance problem 03/03/2013  . Lower extremity edema 03/03/2013  . GERD  (gastroesophageal reflux disease) 09/27/2012  . Blurry vision, bilateral 09/27/2012  . Dyspnea 09/19/2012  . Plantar fasciitis, bilateral 06/15/2012  . Cervical pain (neck) 04/12/2012  . 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 Diabetic peripheral neuropathy ([place]) 12/09/2011  . Hyperlipidemia 12/09/2011  . Sleep apnea 12/09/2011  . Fatigue 10/13/2011  . Chronic urticaria 05/27/2011  . Allergy to  walnuts 05/20/2011  . Sciatic leg pain 07/22/2009  . ROSACEA 05/29/2009  . ACHILLES BURSITIS OR TENDINITIS 03/27/2009  . Type II diabetes mellitus with complication ([place]) 45/80/9983  . Essential hypertension 09/28/2006    Willow Ora, PTA, [facility] [address], [place], [place] [address] [phone] 03/30/17, 9:48 PM     04/18/2017 10-17-99  PT G-Codes  Functional Assessment Tool Used (Outpatient Only) Merrilee Jansky 53/56; clinical judgement; RPE 1-2/10 after >/= 12 minutes of gait  Functional Limitation Mobility: Walking and moving around  Mobility: Walking and Moving Around Goal Status [phone]) CI  Mobility: Walking and Moving Around Discharge Status [phone]) CI    PHYSICAL THERAPY DISCHARGE SUMMARY  Visits from Start of Care: 10  Current functional level related to goals / functional outcomes: Modified independent with ambulation (no device, incr time)   Remaining deficits: decr velocity; decr endurance   Education / Equipment: HEP; community Parkinsons resources  Plan: Patient agrees to discharge.  Patient goals were partially met. Patient is being discharged due to financial reasons.  ?????     Barry Brunner, PT Outpatient Neurorehabilitation [address] [place], [place] [address] [phone]                   Name: AUTIE VASUDEVAN MRN: [id number] Date of Birth: Dec 16, 1950

## 2017-03-31 ENCOUNTER — Ambulatory Visit: Payer: Medicare Other | Admitting: Physical Therapy

## 2017-03-31 NOTE — Telephone Encounter (Signed)
Letter signed and placed at front desk for pick up. Please inform the patient

## 2017-03-31 NOTE — Progress Notes (Signed)
Agree. Richardson Dopp, PA-C    03/31/2017 1:41 PM

## 2017-03-31 NOTE — Telephone Encounter (Signed)
Left message on voicemail that letter was ready to be picked up.

## 2017-04-04 ENCOUNTER — Other Ambulatory Visit: Payer: Medicare Other | Admitting: *Deleted

## 2017-04-04 DIAGNOSIS — I5022 Chronic systolic (congestive) heart failure: Secondary | ICD-10-CM | POA: Diagnosis not present

## 2017-04-05 ENCOUNTER — Ambulatory Visit: Payer: Medicare Other | Admitting: Physical Therapy

## 2017-04-05 ENCOUNTER — Telehealth: Payer: Self-pay | Admitting: *Deleted

## 2017-04-05 LAB — BASIC METABOLIC PANEL
BUN/Creatinine Ratio: 20 (ref 12–28)
BUN: 21 mg/dL (ref 8–27)
CO2: 23 mmol/L (ref 20–29)
Calcium: 10.2 mg/dL (ref 8.7–10.3)
Chloride: 105 mmol/L (ref 96–106)
Creatinine, Ser: 1.04 mg/dL — ABNORMAL HIGH (ref 0.57–1.00)
GFR calc Af Amer: 64 mL/min/{1.73_m2} (ref >59)
GFR calc non Af Amer: 55 mL/min/{1.73_m2} — ABNORMAL LOW (ref >59)
Glucose: 119 mg/dL — ABNORMAL HIGH (ref 65–99)
Potassium: 4.6 mmol/L (ref 3.5–5.2)
Sodium: 143 mmol/L (ref 134–144)

## 2017-04-05 NOTE — Telephone Encounter (Signed)
Pt has been notified of lab results by phone with verbal understanding. Pt thanked me for my call today.   

## 2017-04-05 NOTE — Telephone Encounter (Signed)
-----   Message from Liliane Shi, [place] sent at 04/05/2017  1:18 PM EDT ----- Please call the patient Kidney function is stable. Continue with current treatment plan. Richardson Dopp, PA-C   04/05/2017 1:18 PM

## 2017-04-07 ENCOUNTER — Ambulatory Visit: Payer: Medicare Other | Admitting: Physical Therapy

## 2017-04-07 ENCOUNTER — Encounter (HOSPITAL_COMMUNITY): Payer: Self-pay

## 2017-04-07 ENCOUNTER — Emergency Department (HOSPITAL_COMMUNITY): Payer: Medicare Other

## 2017-04-07 ENCOUNTER — Emergency Department (HOSPITAL_COMMUNITY)
Admission: EM | Admit: 2017-04-07 | Discharge: 2017-04-07 | Disposition: A | Discharge status: Home / Self Care | Payer: Medicare Other | Attending: Emergency Medicine | Admitting: Emergency Medicine

## 2017-04-07 ENCOUNTER — Encounter (HOSPITAL_COMMUNITY): Payer: Self-pay | Admitting: Emergency Medicine

## 2017-04-07 ENCOUNTER — Ambulatory Visit (INDEPENDENT_AMBULATORY_CARE_PROVIDER_SITE_OTHER)
Admission: EM | Admit: 2017-04-07 | Discharge: 2017-04-07 | Disposition: A | Discharge status: Home / Self Care | Payer: Medicare Other | Source: Home / Self Care | Attending: Emergency Medicine | Admitting: Emergency Medicine

## 2017-04-07 DIAGNOSIS — I428 Other cardiomyopathies: Secondary | ICD-10-CM

## 2017-04-07 DIAGNOSIS — G20 Parkinson's disease: Secondary | ICD-10-CM | POA: Insufficient documentation

## 2017-04-07 DIAGNOSIS — J069 Acute upper respiratory infection, unspecified: Secondary | ICD-10-CM | POA: Diagnosis not present

## 2017-04-07 DIAGNOSIS — R0600 Dyspnea, unspecified: Secondary | ICD-10-CM

## 2017-04-07 DIAGNOSIS — I5022 Chronic systolic (congestive) heart failure: Secondary | ICD-10-CM | POA: Insufficient documentation

## 2017-04-07 DIAGNOSIS — B9789 Other viral agents as the cause of diseases classified elsewhere: Secondary | ICD-10-CM | POA: Insufficient documentation

## 2017-04-07 DIAGNOSIS — E119 Type 2 diabetes mellitus without complications: Secondary | ICD-10-CM | POA: Insufficient documentation

## 2017-04-07 DIAGNOSIS — Z79899 Other long term (current) drug therapy: Secondary | ICD-10-CM

## 2017-04-07 DIAGNOSIS — Z880 Allergy status to penicillin: Secondary | ICD-10-CM

## 2017-04-07 DIAGNOSIS — Z7984 Long term (current) use of oral hypoglycemic drugs: Secondary | ICD-10-CM

## 2017-04-07 DIAGNOSIS — R42 Dizziness and giddiness: Secondary | ICD-10-CM | POA: Diagnosis not present

## 2017-04-07 DIAGNOSIS — Z9049 Acquired absence of other specified parts of digestive tract: Secondary | ICD-10-CM

## 2017-04-07 DIAGNOSIS — I447 Left bundle-branch block, unspecified: Secondary | ICD-10-CM | POA: Insufficient documentation

## 2017-04-07 DIAGNOSIS — I110 Hypertensive heart disease with heart failure: Secondary | ICD-10-CM | POA: Insufficient documentation

## 2017-04-07 DIAGNOSIS — Z95810 Presence of automatic (implantable) cardiac defibrillator: Secondary | ICD-10-CM

## 2017-04-07 DIAGNOSIS — R079 Chest pain, unspecified: Secondary | ICD-10-CM

## 2017-04-07 DIAGNOSIS — J45909 Unspecified asthma, uncomplicated: Secondary | ICD-10-CM | POA: Insufficient documentation

## 2017-04-07 DIAGNOSIS — R0609 Other forms of dyspnea: Secondary | ICD-10-CM

## 2017-04-07 DIAGNOSIS — E785 Hyperlipidemia, unspecified: Secondary | ICD-10-CM | POA: Insufficient documentation

## 2017-04-07 DIAGNOSIS — R0789 Other chest pain: Secondary | ICD-10-CM

## 2017-04-07 DIAGNOSIS — R05 Cough: Secondary | ICD-10-CM | POA: Insufficient documentation

## 2017-04-07 DIAGNOSIS — R509 Fever, unspecified: Secondary | ICD-10-CM

## 2017-04-07 LAB — BASIC METABOLIC PANEL
Anion gap: 12 (ref 5–15)
BUN: 23 mg/dL — ABNORMAL HIGH (ref 6–20)
CO2: 27 mmol/L (ref 22–32)
Calcium: 10.1 mg/dL (ref 8.9–10.3)
Chloride: 103 mmol/L (ref 101–111)
Creatinine, Ser: 1.18 mg/dL — ABNORMAL HIGH (ref 0.44–1.00)
GFR calc Af Amer: 54 mL/min — ABNORMAL LOW (ref >60)
GFR calc non Af Amer: 46 mL/min — ABNORMAL LOW (ref >60)
Glucose, Bld: 133 mg/dL — ABNORMAL HIGH (ref 65–99)
Potassium: 4.1 mmol/L (ref 3.5–5.1)
Sodium: 142 mmol/L (ref 135–145)

## 2017-04-07 LAB — CBC
HCT: 39.6 % (ref 36.0–46.0)
Hemoglobin: 13.3 g/dL (ref 12.0–15.0)
MCH: 30 pg (ref 26.0–34.0)
MCHC: 33.6 g/dL (ref 30.0–36.0)
MCV: 89.4 fL (ref 78.0–100.0)
Platelets: 197 10*3/uL (ref 150–400)
RBC: 4.43 MIL/uL (ref 3.87–5.11)
RDW: 12.5 % (ref 11.5–15.5)
WBC: 11.2 10*3/uL — ABNORMAL HIGH (ref 4.0–10.5)

## 2017-04-07 LAB — POCT RAPID STREP A: Streptococcus, Group A Screen (Direct): NEGATIVE

## 2017-04-07 LAB — I-STAT TROPONIN, ED: Troponin i, poc: 0 ng/mL (ref 0.00–0.08)

## 2017-04-07 NOTE — ED Triage Notes (Signed)
Pt c/o cold sx onset: 4 days  Sx include: fevers, prod cough, congestion, SOB, ST, HA, bilateral ear pain and feeling dizzy   A&O x4... NAD

## 2017-04-07 NOTE — ED Triage Notes (Signed)
Pt from [facility] with complains of sore throat, productive cough x 2 days. Pt also began having chest pain yesterday and thinks it's from the cough but pt has CHF and a defibrillator. No edema or shob. VSS.

## 2017-04-07 NOTE — ED Provider Notes (Signed)
[facility] DEPT Provider Note   CSN: [id number] Arrival date & time: 04/07/17  1114     History   Chief Complaint Chief Complaint  Patient presents with  . Chest Pain  . Sore Throat  . Cough    HPI TENEISHA GIGNAC is a 66 y.o. female.  This is a 66 year old female with PMH of HTN, T2DM, nonischemic cardiomyopathy (last EF 20-25% in 2017) and AICD placement who presents with central pleuritic chest pain that began initially last night. She has had URI symptoms since Tuesday. Denies change in sputum but endorses worsening cough. She had a sore throat, nasal congestion, and rhinorrhea that began, now she has pleuritic pain with sputum production that began last night.  Was seen at [facility] where blood work was performed and EKG performed. She was sent here for CXR.    The history is provided by the patient, the spouse and medical records.    Past Medical History:  Diagnosis Date  . Asthma   . Chronic systolic CHF (congestive heart failure) ([place])    a. cMRI 4/15: EF 34% and findings - c/w NICM, normal RV size and function (RVEF 61%), Mild MR // b. Echo 2/15:  EF 30-35%, diff HK, ant-sept AK, Gr 2 DD, mild MR, trivial TR  //  c. Echo 5/17: EF 20-25%, severe diffuse HK, marked systolic dyssynchrony, grade 1 diastolic dysfunction, mild MR  //  d. RHC 5/17: Fick CO 2.9, RVSP 19, PASP 15, PW mean 2, low filing pressures and preserved CO   . Diabetes mellitus   . Gastritis   . History of echocardiogram    Echo 6/18: EF 30-35, diffuse HK, grade 1 diastolic dysfunction, trivial MR, mild LAE, mild TR, no pericardial effusion  . History of nuclear stress test    Myoview 5/18: EF 49, no ischemia, inferoseptal defect c/w LBBB artifact (intermediate risk due to EF < 50).  [name] Kitchen HTN (hypertension)   . Hyperlipidemia   . NICM (nonischemic cardiomyopathy) ([place])    a. Nuclear 5/13: Normal stress nuclear study. LV Ejection Fraction: 58%  //  b. LHC 10/14: Minor luminal irregularity in prox LAD, EF 35%   .  Parkinson disease ([place])   . Plantar fasciitis   . Sleep apnea    was retested and no longer had it and so d/c CPAP  . Urticaria     Patient Active Problem List   Diagnosis Date Noted  . Hair loss 07/21/2016  . Middle ear effusion, bilateral 04/28/2016  . Suprapubic pain 04/14/2016  . Allergic rhinitis 11/09/2015  . Upper airway cough syndrome 08/20/2015  . Osteopenia 07/10/2015  . Mild persistent asthma in adult without complication 85/46/2703  . Bunion, left foot 05/20/2014  . NICM (nonischemic cardiomyopathy) ([place]) 04/23/2014  . Arthritis or polyarthritis, rheumatoid ([place]) 03/12/2014  . ICD (implantable cardioverter-defibrillator), dual, in situ 03/06/2014  . Thoracic or lumbosacral neuritis or radiculitis, unspecified 07/03/2013  . Chronic systolic heart failure ([place]) 05/31/2013  . Parkinson disease ([place]) 05/21/2013  . 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 Major depression 03/31/2013  . Balance problem 03/03/2013  . Lower extremity edema 03/03/2013  . GERD (gastroesophageal reflux disease) 09/27/2012  . Blurry vision, bilateral 09/27/2012  . Dyspnea 09/19/2012  . Plantar fasciitis, bilateral 06/15/2012  . Cervical pain (neck) 04/12/2012  . 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 Diabetic peripheral neuropathy ([place]) 12/09/2011  . Hyperlipidemia 12/09/2011  . Sleep apnea 12/09/2011  . Fatigue 10/13/2011  . Chronic urticaria 05/27/2011  .  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 05/20/2011  . Sciatic leg pain 07/22/2009  . ROSACEA 05/29/2009  . ACHILLES BURSITIS OR TENDINITIS 03/27/2009  . Type II diabetes mellitus with complication ([place]) 82/99/3716  . Essential hypertension 09/28/2006    Past Surgical History:  Procedure Laterality Date  . BUNIONECTOMY    . CARDIAC CATHETERIZATION N/A 12/16/2015   Procedure: Right Heart Cath;  Surgeon: Sherren Mocha, MD;  Location: [facility] CV LAB;  Service: Cardiovascular;  Laterality: N/A;  . CHOLECYSTECTOMY    . IMPLANTABLE  CARDIOVERTER DEFIBRILLATOR IMPLANT  11-25-13   MDT dual chamber ICD implanted by [name] for primary prevention  . IMPLANTABLE CARDIOVERTER DEFIBRILLATOR IMPLANT N/A 11/25/2013   Procedure: IMPLANTABLE CARDIOVERTER DEFIBRILLATOR IMPLANT;  Surgeon: [name], MD;  Location: [facility] CATH LAB;  Service: Cardiovascular;  Laterality: N/A;  . LEFT AND RIGHT HEART CATHETERIZATION WITH CORONARY ANGIOGRAM N/A 05/31/2013   Procedure: LEFT AND RIGHT HEART CATHETERIZATION WITH CORONARY ANGIOGRAM;  Surgeon: [name], MD;  Location: [facility] CATH LAB;  Service: Cardiovascular;  Laterality: N/A;  . TONSILLECTOMY    . TUBAL LIGATION      OB History    No data available       Home Medications    Prior to Admission medications   Medication Sig Start Date End Date Taking? Authorizing Provider  albuterol (PROVENTIL HFA;VENTOLIN HFA) 108 (90 Base) MCG/ACT inhaler Inhale 2 puffs into the lungs every 4 (four) hours as needed for wheezing or shortness of breath.    [provider]  albuterol (PROVENTIL) (2.5 MG/3ML) 0.083% nebulizer solution 2.5 mg every 6 (six) hours as needed for wheezing or shortness of breath.     [provider]  Calcium Carb-Cholecalciferol 600-800 MG-UNIT TABS Take 1 tablet by mouth daily. 11/23/16   Rogue Bussing, MD  cyclobenzaprine (FLEXERIL) 5 MG tablet Take 1 tablet (5 mg total) by mouth 3 (three) times daily as needed for muscle spasms. 08/12/16   Diallo, Earna Coder, MD  fluticasone (FLONASE) 50 MCG/ACT nasal spray Place 2 sprays into both nostrils daily. 11/08/16   Vivi Barrack, MD  fluticasone furoate-vilanterol (BREO ELLIPTA) 200-25 MCG/INH AEPB Inhale 1 puff into the lungs daily. 01/19/17   Rogue Bussing, MD  furosemide (LASIX) 40 MG tablet Take 20 mg by mouth daily as needed for fluid (For swelling).  06/30/14   [provider]  losartan (COZAAR) 25 MG tablet Take 1 tablet (25 mg total) by mouth daily. 02/17/17 05/18/17  Sherren Mocha, MD  metFORMIN (GLUCOPHAGE) 500 MG tablet TAKE 2 TABLETS TWICE DAILY WITH A MEAL 01/16/17   Rogue Bussing, MD  metoprolol succinate (TOPROL-XL) 50 MG 24 hr tablet TAKE 1 AND 1/2 TABLETS DAILY FOR 1 WEEK, THEN INCREASE TO 2 TABLETS DAILY AS DIRECTED (DOSE CHANGE) 06/20/16   [name], MD  omeprazole (PRILOSEC) 40 MG capsule Take 1 capsule (40 mg total) by mouth as directed. Take 40 mg 30 minutes before breakfast for 2 weeks. Then take as needed. 12/28/16   Richardson Dopp T, PA-C  potassium chloride (K-DUR,KLOR-CON) 10 MEQ tablet Take 1 tablet (10 mEq total) by mouth daily as needed (Take with lasix as needed for swelling). 07/06/16   Rogue Bussing, MD  spironolactone (ALDACTONE) 25 MG tablet Take 0.5 tablets (12.5 mg total) by mouth daily. 03/08/17 06/06/17  Daune Perch, NP    Family History Family History  Problem Relation Age of Onset  . Coronary artery disease Father  Died age 92  . Heart attack Father   . Diabetes Father   . Coronary artery disease Mother        Died age 31  . Heart attack Mother   . Diabetes Mother   . Stroke Mother   . Parkinson's disease Sister   . Heart disease Sister   . Hepatitis C Sister   . Diabetes Son 37       T1DM  . Diabetes Sister   . Heart disease Sister   . Diabetes Sister   . Heart disease Sister     Social History Social History  Substance Use Topics  . Smoking status: Never Smoker  . Smokeless tobacco: Never Used     Comment: exposed to smoke during child hood (parents)  . Alcohol use No     Allergies   Aspirin; Penicillins; Prempro [conj estrog-medroxyprogest ace]; Ace inhibitors; and Simvastatin   Review of Systems Review of Systems  Constitutional: Positive for chills. Negative for activity change, appetite change, diaphoresis, fever and unexpected weight change.  HENT: Negative for ear pain and sore throat.   Eyes: Negative for pain and visual disturbance.  Respiratory: Positive for  cough, chest tightness and shortness of breath. Negative for wheezing and stridor.   Cardiovascular: Negative for chest pain, palpitations and leg swelling.  Gastrointestinal: Negative for abdominal pain and vomiting.  Genitourinary: Negative for dysuria and hematuria.  Musculoskeletal: Negative for arthralgias and back pain.  Skin: Negative for color change and rash.  Neurological: Negative for seizures and syncope.  All other systems reviewed and are negative.  Physical Exam Updated Vital Signs BP (!) 112/42   Pulse 87   Temp 99.1 F (37.3 C) (Oral)   Resp 18   Ht 5\' 2"  (1.575 m)   Wt 63.5 kg (140 lb)   LMP 08/01/2000 (Approximate)   SpO2 96%   BMI 25.61 kg/m   Physical Exam  Constitutional: She appears well-developed and well-nourished. No distress.  HENT:  Head: Normocephalic and atraumatic.  Mouth/Throat: Mucous membranes are normal. No trismus in the jaw. No uvula swelling. Posterior oropharyngeal erythema present. No tonsillar abscesses. Tonsils are 0 on the right. Tonsils are 1+ on the left. Tonsillar exudate.  Eyes: Pupils are equal, round, and reactive to light. Conjunctivae are normal.  Neck: Neck supple.  Cardiovascular: Normal rate and regular rhythm.   No murmur heard. Pulmonary/Chest: Effort normal and breath sounds normal. No respiratory distress. She exhibits tenderness.  Abdominal: Soft. There is no tenderness.  Musculoskeletal: She exhibits no edema.       Right ankle: She exhibits no swelling. No tenderness.       Left ankle: She exhibits no swelling. No tenderness.       Right lower leg: She exhibits no tenderness and no edema.       Left lower leg: She exhibits no tenderness and no edema.  Neurological: She is alert.  Skin: Skin is warm and dry. She is not diaphoretic.  Psychiatric: She has a normal mood and affect.  Nursing note and vitals reviewed.    ED Treatments / Results  Labs (all labs ordered are listed, but only abnormal results are  displayed) Labs Reviewed  BASIC METABOLIC PANEL - Abnormal; Notable for the following:       Result Value   Glucose, Bld 133 (*)    BUN 23 (*)    Creatinine, Ser 1.18 (*)    GFR calc non Af Amer 46 (*)    GFR  calc Af Amer 54 (*)    All other components within normal limits  CBC - Abnormal; Notable for the following:    WBC 11.2 (*)    All other components within normal limits  I-STAT TROPONIN, ED    EKG  EKG Interpretation  Date/Time:  Friday April 07 2017 11:30:02 EDT Ventricular Rate:  91 PR Interval:  152 QRS Duration: 128 QT Interval:  394 QTC Calculation: 484 R Axis:   97 Text Interpretation:  Normal sinus rhythm Rightward axis Non-specific intra-ventricular conduction block T wave abnormality, consider inferolateral ischemia Abnormal ECG no significant change since 2016 Confirmed by Sherwood Gambler [id number]) on 04/07/2017 11:42:46 AM       Radiology Dg Chest 2 View  Result Date: 04/07/2017 CLINICAL DATA:  Chest pain and cough EXAM: CHEST  2 VIEW COMPARISON:  January 05, 2015 FINDINGS: Lungs are clear. Heart size and pulmonary vascularity are normal. No adenopathy. Pacemaker leads are attached to the right atrium and right ventricle. There is degenerative change in the thoracic spine. No pneumothorax. IMPRESSION: No edema or consolidation. Electronically Signed   By: Lowella Grip III M.D.   On: 04/07/2017 12:16    Procedures Procedures (including critical care time)  Medications Ordered in ED Medications - No data to display   Initial Impression / Assessment and Plan / ED Course  I have reviewed the triage vital signs and the nursing notes.  Pertinent labs & imaging results that were available during my care of the patient were reviewed by me and considered in my medical decision making (see chart for details).     This is a 66 year old female with PMH of HTN, T2DM, nonischemic cardiomyopathy (last EF 20-25% in 2017) and AICD placement who presents with central  pleuritic chest pain that began initially last night. She has had URI symptoms since Tuesday. Denies change in sputum but endorses worsening cough.  On exam no leg swelling, distal pulses intact. Patient has no active chest pain. Lungs clear to auscultation.  EKG reviewed. LBBB consistent with prior EKGs. No sign of ST segment changes suggestive of ischemia.  CXR shows no pneumonia. Patient has slight leukocytosis suggestive of URI infection. Subtle left-sided tonsillar exudate suggestive of pharyngitis. Given URI symptoms and these findings with negative Strep screen likely viral pathology.  Strep screen negative. BMP unremarkable. Troponin x1 negative.   Patient has not been admitted within the past 2 years for her asthma. She uses her rescue inhaler on average 3x per week. Uses maintenance inhaler daily as well.  Patient's has no wheezing, no respiratory distress. Unlikely to be asthma exacerbation at this time. Outpatient steroids and antibiotics not indicated.  Unlikely to be CHF exacerbation given no clinical signs of volume overload or dyspnea. CXR unremarkable. Unlikely PE. PE is not an equally likely or #1 diagnosis. Low Well's Score.  At this time, given well-appearing patient, reassuring clinical presentation and physical exam findings, will discharge home with close PCP follow up.  Advised several methods of symptom control including cough and decongestion.   All questions answered prior to discharge. Return precautions given.  Final Clinical Impressions(s) / ED Diagnoses   Final diagnoses:  Viral URI with cough    New Prescriptions Discharge Medication List as of 04/07/2017  3:16 PM       Aldona Lento, MD 04/07/17 1541    Margette Fast, MD 04/08/17 [phone]

## 2017-04-07 NOTE — Discharge Instructions (Signed)
Go to the ED now. We need to ensure that this is not your heart causing your symptoms

## 2017-04-07 NOTE — ED Provider Notes (Signed)
HPI  SUBJECTIVE:  Veronica Shaw is a 66 y.o. female who presents with increasing shortness of breath, dull, minutes long substernal chest pain that radiates up to her neck with nausea, diaphoresis for the past several days. She has had similar chest pain "when I had heart problems" for which she had a defibrillator placed. She reports occasional wheezing, dyspnea on exertion to 10 feet which is worse than her baseline. She reports PND, nocturia. She has 5 pillow orthopnea at baseline. No unintentional weight gain, lower extremity edema, abdominal pain, syncope. States that the chest pain does not radiate to her arm or back. She does report a cough productive of whitish mucus. She reports body aches, fatigue, states she feels more confused than usual. states that she feels feverish but has had no fevers to her knowledge. She tried her albuterol inhaler this morning without improvement in her symptoms. Symptoms are worse with walking and lying down. States that she has not felt her defibrillator fire  She has an extensive past medical history including asthma, CHF with an EF of 20-25%, nonischemic cardiomyopathy status post defibrillator placement diabetes, hypertension. No history of MI or stroke. She was seen on 8/08 for lightheadedness, dizziness. her BPs were noted to be 83-109/41-62, mostly in the 90s. She was complaining of lightheadedness with standing and abrupt turns with walking. Plan was to decrease her spironolactone from 25 mg to 12.5 mg. She also takes with losartan 25 mg, Toprol XL 100 mg and Lasix as needed. States that she has not had any other recent change in her medications other than decreased and spironolactone. she does have a family history of MI in her father and mother.. PMD: Rogue Bussing, MD   Past Medical History:  Diagnosis Date  . Asthma   . Chronic systolic CHF (congestive heart failure) ([place])    a. cMRI 4/15: EF 34% and findings - c/w NICM, normal RV size and  function (RVEF 61%), Mild MR // b. Echo 2/15:  EF 30-35%, diff HK, ant-sept AK, Gr 2 DD, mild MR, trivial TR  //  c. Echo 5/17: EF 20-25%, severe diffuse HK, marked systolic dyssynchrony, grade 1 diastolic dysfunction, mild MR  //  d. RHC 5/17: Fick CO 2.9, RVSP 19, PASP 15, PW mean 2, low filing pressures and preserved CO   . Diabetes mellitus   . Gastritis   . History of echocardiogram    Echo 6/18: EF 30-35, diffuse HK, grade 1 diastolic dysfunction, trivial MR, mild LAE, mild TR, no pericardial effusion  . History of nuclear stress test    Myoview 5/18: EF 49, no ischemia, inferoseptal defect c/w LBBB artifact (intermediate risk due to EF < 50).  [name] Kitchen HTN (hypertension)   . Hyperlipidemia   . NICM (nonischemic cardiomyopathy) ([place])    a. Nuclear 5/13: Normal stress nuclear study. LV Ejection Fraction: 58%  //  b. LHC 10/14: Minor luminal irregularity in prox LAD, EF 35%   . Parkinson disease ([place])   . Plantar fasciitis   . Sleep apnea    was retested and no longer had it and so d/c CPAP  . Urticaria     Past Surgical History:  Procedure Laterality Date  . BUNIONECTOMY    . CARDIAC CATHETERIZATION N/A 12/16/2015   Procedure: Right Heart Cath;  Surgeon: Sherren Mocha, MD;  Location: [facility] CV LAB;  Service: Cardiovascular;  Laterality: N/A;  . CHOLECYSTECTOMY    . IMPLANTABLE CARDIOVERTER DEFIBRILLATOR IMPLANT  11-25-13   MDT  dual chamber ICD implanted by [name] for primary prevention  . IMPLANTABLE CARDIOVERTER DEFIBRILLATOR IMPLANT N/A 11/25/2013   Procedure: IMPLANTABLE CARDIOVERTER DEFIBRILLATOR IMPLANT;  Surgeon: [name], MD;  Location: [facility] CATH LAB;  Service: Cardiovascular;  Laterality: N/A;  . LEFT AND RIGHT HEART CATHETERIZATION WITH CORONARY ANGIOGRAM N/A 05/31/2013   Procedure: LEFT AND RIGHT HEART CATHETERIZATION WITH CORONARY ANGIOGRAM;  Surgeon: [name], MD;  Location: [facility] CATH LAB;  Service: Cardiovascular;  Laterality: N/A;  . TONSILLECTOMY    . TUBAL  LIGATION      Family History  Problem Relation Age of Onset  . Coronary artery disease Father        Died age 33  . Heart attack Father   . Diabetes Father   . Coronary artery disease Mother        Died age 68  . Heart attack Mother   . Diabetes Mother   . Stroke Mother   . Parkinson's disease Sister   . Heart disease Sister   . Hepatitis C Sister   . Diabetes Son 37       T1DM  . Diabetes Sister   . Heart disease Sister   . Diabetes Sister   . Heart disease Sister     Social History  Substance Use Topics  . Smoking status: Never Smoker  . Smokeless tobacco: Never Used     Comment: exposed to smoke during child hood (parents)  . Alcohol use No    No current facility-administered medications for this encounter.   Current Outpatient Prescriptions:  .  albuterol (PROVENTIL HFA;VENTOLIN HFA) 108 (90 Base) MCG/ACT inhaler, Inhale 2 puffs into the lungs every 4 (four) hours as needed for wheezing or shortness of breath., Disp: , Rfl:  .  Calcium Carb-Cholecalciferol 600-800 MG-UNIT TABS, Take 1 tablet by mouth daily., Disp: 90 tablet, Rfl: 3 .  fluticasone (FLONASE) 50 MCG/ACT nasal spray, Place 2 sprays into both nostrils daily., Disp: 16 g, Rfl: 1 .  losartan (COZAAR) 25 MG tablet, Take 1 tablet (25 mg total) by mouth daily., Disp: 90 tablet, Rfl: 3 .  metFORMIN (GLUCOPHAGE) 500 MG tablet, TAKE 2 TABLETS TWICE DAILY WITH A MEAL, Disp: 360 tablet, Rfl: 3 .  metoprolol succinate (TOPROL-XL) 50 MG 24 hr tablet, TAKE 1 AND 1/2 TABLETS DAILY FOR 1 WEEK, THEN INCREASE TO 2 TABLETS DAILY AS DIRECTED (DOSE CHANGE), Disp: 180 tablet, Rfl: 3 .  omeprazole (PRILOSEC) 40 MG capsule, Take 1 capsule (40 mg total) by mouth as directed. Take 40 mg 30 minutes before breakfast for 2 weeks. Then take as needed., Disp: 30 capsule, Rfl: 3 .  spironolactone (ALDACTONE) 25 MG tablet, Take 0.5 tablets (12.5 mg total) by mouth daily., Disp: 90 tablet, Rfl: 3 .  albuterol (PROVENTIL) (2.5 MG/3ML)  0.083% nebulizer solution, 2.5 mg every 6 (six) hours as needed for wheezing or shortness of breath. , Disp: , Rfl:  .  cyclobenzaprine (FLEXERIL) 5 MG tablet, Take 1 tablet (5 mg total) by mouth 3 (three) times daily as needed for muscle spasms., Disp: 30 tablet, Rfl: 2 .  fluticasone furoate-vilanterol (BREO ELLIPTA) 200-25 MCG/INH AEPB, Inhale 1 puff into the lungs daily., Disp: 14 each, Rfl: 0 .  furosemide (LASIX) 40 MG tablet, Take 20 mg by mouth daily as needed for fluid (For swelling). , Disp: , Rfl:  .  potassium chloride (K-DUR,KLOR-CON) 10 MEQ tablet, Take 1 tablet (10 mEq total) by mouth daily as needed (Take with lasix as needed  for swelling)., Disp: 30 tablet, Rfl: 6  Allergies  Allergen Reactions  . Aspirin Swelling and Other (See Comments)    Other reaction(s): Other (See Comments) Causes nose bleeds *ONLY THE COATED ASA* Causes nose bleeds *ONLY THE COATED ASA*  . Penicillins Shortness Of Breath and Rash    Shortness of Breath - Throat felt like it was closing.   [name] [Conj Estrog-Medroxyprogest Ace] Shortness Of Breath    Throat swelling Throat swelling  . Ace Inhibitors Cough  . Simvastatin Other (See Comments) and Rash    Muscle aches     ROS  As noted in HPI.   Physical Exam  BP (!) 99/42 (BP Location: Left Arm)   Pulse 88   Temp 98.9 F (37.2 C) (Oral)   Resp 16   LMP 08/01/2000 (Approximate)   SpO2 96%   Constitutional: Well developed, well nourished, no acute distress Eyes:  EOMI, conjunctiva normal bilaterally HENT: Normocephalic, atraumatic,mucus membranes moist Respiratory: Normal inspiratory effort, Lungs clear bilaterally Cardiovascular: Normal rate regular rhythm no murmurs rubs or gallops. No appreciable JVD GI: nondistended, no appreciable splenomegaly skin: No rash, skin intact Musculoskeletal: no deformities, calves symmetric, nontender Neurologic: Alert & oriented x 3, no focal neuro deficits Psychiatric: Speech and behavior  appropriate   ED Course   Medications - No data to display  Orders Placed This Encounter  Procedures  . Culture, group A strep    Standing Status:   Standing    Number of Occurrences:   1  . POCT rapid strep A [facility])    Standing Status:   Standing    Number of Occurrences:   1  . ED EKG    Standing Status:   Standing    Number of Occurrences:   1    Order Specific Question:   Reason for Exam    Answer:   Chest Pain    Results for orders placed or performed during the hospital encounter of 04/07/17 (from the past 24 hour(s))  POCT rapid strep A [facility])     Status: None   Collection Time: 04/07/17 10:29 AM  Result Value Ref Range   Streptococcus, Group A Screen (Direct) NEGATIVE NEGATIVE   No results found.  ED Clinical Impression  Chest pain, unspecified type  Dyspnea, unspecified type   ED Assessment/Plan  Previous outside records reviewed. As noted in history of present illness.  Obtaining EKG to rule out any acute changes. She appears euvolemic. however, I'm concerned that she may have an exacerbation of her CHF or cardiac ischemia with her symptoms. This may also be an asthma exacerbation. She is complaining of some cold URI symptoms as well.  EKG, rate 91. Epistaxis deviation.  Left bundle branch block. No ST T wave changes compared to EKG from 12/28/2016. EKG unchanged from previous EKG. Feel the patient is stable to go via private vehicle. Transferring to the ED for comprehensive workup. Discussed rationale for transfer with patient. She agrees with plan.  No orders of the defined types were placed in this encounter.   *This clinic note was created using Dragon dictation software. Therefore, there may be occasional mistakes despite careful proofreading.  ?   Melynda Ripple, MD 04/07/17 1103

## 2017-04-09 LAB — CULTURE, GROUP A STREP (THRC)

## 2017-04-12 ENCOUNTER — Ambulatory Visit: Payer: Medicare Other

## 2017-04-14 ENCOUNTER — Ambulatory Visit: Payer: Medicare Other | Admitting: Adult Health

## 2017-04-27 ENCOUNTER — Ambulatory Visit
Admission: RE | Admit: 2017-04-27 | Discharge: 2017-04-27 | Disposition: A | Discharge status: Home / Self Care | Payer: Medicare Other | Source: Ambulatory Visit | Attending: Family Medicine | Admitting: Family Medicine

## 2017-04-27 DIAGNOSIS — Z1231 Encounter for screening mammogram for malignant neoplasm of breast: Secondary | ICD-10-CM

## 2017-05-18 ENCOUNTER — Ambulatory Visit (INDEPENDENT_AMBULATORY_CARE_PROVIDER_SITE_OTHER): Payer: Medicare Other | Admitting: *Deleted

## 2017-05-18 DIAGNOSIS — I428 Other cardiomyopathies: Secondary | ICD-10-CM

## 2017-05-18 DIAGNOSIS — I5022 Chronic systolic (congestive) heart failure: Secondary | ICD-10-CM

## 2017-05-18 DIAGNOSIS — Z95810 Presence of automatic (implantable) cardiac defibrillator: Secondary | ICD-10-CM | POA: Diagnosis not present

## 2017-05-18 NOTE — Progress Notes (Signed)
EPIC Encounter for ICM Monitoring  Patient Name: Veronica Shaw is a 66 y.o. female Date: 05/18/2017 Primary Care Physican: Fitzgerald, Hillary Moen, MD Primary Cardiologist:Cooper/Weaver, PA Electrophysiologist: Taylor Dry Weight:144 lbs      Heart Failure questions reviewed, pt symptomatic with shortness of breath during time of decreased impedance. She used her inhaler and took Furosemide 20 mg for a few days.  She said she does not think either medication completely resolves the shortness of breath.     Thoracic impedance normal but was abnormal suggesting fluid accumulation from 05/10/2017 to 05/15/2017.  Prescribed dosage: Furosemide 20 mg 1 tablet daily as needed. Potassium 10 mEq 1 tablet daily as needed (taken with Lasix).   Labs: 01/25/2017 Creatinine 1.17, BUN 22, Potassium 4.5, Sodium 142, EGFR 48-56 12/28/2016 Creatinine 1.05, BUN 20, Potassium 4.2, Sodium 143, EGFR 55-64, NT-Pro BNP 244 12/21/2016 Creatinine 1.33, BUN 24, Potassium 5.1, Sodium 140 07/21/2016 Creatinine 1.18, BUN 17, Potassium 4.1, Sodium 139, EGFR 48-55  Recommendations: No changes.   Encouraged to call for fluid symptoms.  Follow-up plan: ICM clinic phone appointment on 06/26/2017.  Office appointment scheduled 05/26/2017 with Dr. Taylor and 06/19/2017 with Dr Cooper.   Copy of ICM check sent to Dr. Taylor.   3 month ICM trend: 05/18/2017   1 Year ICM trend:      Laurie S Short, RN 05/18/2017 1:06 PM    

## 2017-05-18 NOTE — Progress Notes (Signed)
Remote ICD transmission.   

## 2017-05-25 ENCOUNTER — Encounter: Payer: Self-pay | Admitting: Cardiology

## 2017-05-26 ENCOUNTER — Ambulatory Visit (INDEPENDENT_AMBULATORY_CARE_PROVIDER_SITE_OTHER): Payer: Medicare Other | Admitting: Internal Medicine

## 2017-05-26 ENCOUNTER — Encounter: Payer: Self-pay | Admitting: Internal Medicine

## 2017-05-26 VITALS — BP 132/60 | HR 74 | Resp 16 | Ht 62.0 in | Wt 138.4 lb

## 2017-05-26 DIAGNOSIS — I428 Other cardiomyopathies: Secondary | ICD-10-CM | POA: Diagnosis not present

## 2017-05-26 DIAGNOSIS — I5022 Chronic systolic (congestive) heart failure: Secondary | ICD-10-CM | POA: Diagnosis not present

## 2017-05-26 DIAGNOSIS — Z95810 Presence of automatic (implantable) cardiac defibrillator: Secondary | ICD-10-CM | POA: Diagnosis not present

## 2017-05-26 LAB — CUP PACEART INCLINIC DEVICE CHECK
Battery Remaining Longevity: 89 mo
Battery Voltage: 2.99 V
Brady Statistic AP VP Percent: 0 %
Brady Statistic AP VS Percent: 0.02 %
Brady Statistic AS VP Percent: 0.03 %
Brady Statistic AS VS Percent: 99.95 %
Brady Statistic RA Percent Paced: 0.02 %
Brady Statistic RV Percent Paced: 0.03 %
Date Time Interrogation Session: 20181026134238
HighPow Impedance: 77 Ohm
Implantable Lead Implant Date: 20150427
Implantable Lead Implant Date: 20150427
Implantable Lead Location: 753859
Implantable Lead Location: 753860
Implantable Lead Model: 5076
Implantable Lead Model: 6935
Implantable Pulse Generator Implant Date: 20150427
Lead Channel Impedance Value: 4047 Ohm
Lead Channel Impedance Value: 4047 Ohm
Lead Channel Impedance Value: 4047 Ohm
Lead Channel Impedance Value: 418 Ohm
Lead Channel Impedance Value: 646 Ohm
Lead Channel Impedance Value: 665 Ohm
Lead Channel Pacing Threshold Amplitude: 0.5 V
Lead Channel Pacing Threshold Amplitude: 0.75 V
Lead Channel Pacing Threshold Pulse Width: 0.4 ms
Lead Channel Pacing Threshold Pulse Width: 0.4 ms
Lead Channel Sensing Intrinsic Amplitude: 1.625 mV
Lead Channel Sensing Intrinsic Amplitude: 11.375 mV
Lead Channel Sensing Intrinsic Amplitude: 12.75 mV
Lead Channel Sensing Intrinsic Amplitude: 2 mV
Lead Channel Setting Pacing Amplitude: 2 V
Lead Channel Setting Pacing Amplitude: 2.5 V
Lead Channel Setting Pacing Pulse Width: 0.4 ms
Lead Channel Setting Sensing Sensitivity: 0.3 mV

## 2017-05-26 NOTE — Patient Instructions (Signed)
Medication Instructions:  Your physician recommends that you continue on your current medications as directed. Please refer to the Current Medication list given to you today.  Labwork: None ordered.  Testing/Procedures: None ordered.  Follow-up:  Your physician wants you to follow-up in: one year with Dr. Lovena Le.   You will receive a reminder letter in the mail two months in advance. If you don't receive a letter, please call our office to schedule the follow-up appointment.  Remote monitoring is used to monitor your ICD from home. This monitoring reduces the number of office visits required to check your device to one time per year. It allows [place] to keep an eye on the functioning of your device to ensure it is working properly. You are scheduled for a device check from home on 08/17/2016. You may send your transmission at any time that day. If you have a wireless device, the transmission will be sent automatically. After your physician reviews your transmission, you will receive a postcard with your next transmission date.    Any Other Special Instructions Will Be Listed Below (If Applicable).     If you need a refill on your cardiac medications before your next appointment, please call your pharmacy.

## 2017-05-26 NOTE — Progress Notes (Signed)
HPI Veronica Shaw returns today for follow-up. She is a very pleasant 66 year old woman with an ischemic cardiomyopathy, chronic systolic heart failure, COPD, and left bundle branch block, status post ICD implantation. In the interim, she has been stable, but does note that her Parkinson's syndrome symptoms have worsened. She has had some dizzy headedness but has not passed out. Her tremor however has improved. Allergies  Allergen Reactions  . Aspirin Swelling and Other (See Comments)    Other reaction(s): Other (See Comments) Causes nose bleeds *ONLY THE COATED ASA* Causes nose bleeds *ONLY THE COATED ASA*  . Penicillins Shortness Of Breath and Rash    Shortness of Breath - Throat felt like it was closing.   [name] [Conj Estrog-Medroxyprogest Ace] Shortness Of Breath    Throat swelling Throat swelling  . Ace Inhibitors Cough  . Simvastatin Other (See Comments) and Rash    Muscle aches     Current Outpatient Prescriptions  Medication Sig Dispense Refill  . albuterol (PROVENTIL HFA;VENTOLIN HFA) 108 (90 Base) MCG/ACT inhaler Inhale 2 puffs into the lungs every 4 (four) hours as needed for wheezing or shortness of breath.    [name] Kitchen albuterol (PROVENTIL) (2.5 MG/3ML) 0.083% nebulizer solution 2.5 mg every 6 (six) hours as needed for wheezing or shortness of breath.     . Calcium Carb-Cholecalciferol 600-800 MG-UNIT TABS Take 1 tablet by mouth daily. 90 tablet 3  . cyclobenzaprine (FLEXERIL) 5 MG tablet Take 1 tablet (5 mg total) by mouth 3 (three) times daily as needed for muscle spasms. 30 tablet 2  . fluticasone (FLONASE) 50 MCG/ACT nasal spray Place 2 sprays into both nostrils daily. 16 g 1  . fluticasone furoate-vilanterol (BREO ELLIPTA) 200-25 MCG/INH AEPB Inhale 1 puff into the lungs daily. 14 each 0  . furosemide (LASIX) 40 MG tablet Take 20 mg by mouth daily as needed for fluid (For swelling).     . metFORMIN (GLUCOPHAGE) 500 MG tablet TAKE 2 TABLETS TWICE DAILY WITH A MEAL  360 tablet 3  . metoprolol succinate (TOPROL-XL) 50 MG 24 hr tablet TAKE 1 AND 1/2 TABLETS DAILY FOR 1 WEEK, THEN INCREASE TO 2 TABLETS DAILY AS DIRECTED (DOSE CHANGE) 180 tablet 3  . pantoprazole (PROTONIX) 40 MG tablet     . potassium chloride (K-DUR,KLOR-CON) 10 MEQ tablet Take 1 tablet (10 mEq total) by mouth daily as needed (Take with lasix as needed for swelling). 30 tablet 6  . spironolactone (ALDACTONE) 25 MG tablet Take 0.5 tablets (12.5 mg total) by mouth daily. 90 tablet 3  . losartan (COZAAR) 25 MG tablet Take 1 tablet (25 mg total) by mouth daily. 90 tablet 3   No current facility-administered medications for this visit.      Past Medical History:  Diagnosis Date  . Asthma   . Chronic systolic CHF (congestive heart failure) ([place])    a. cMRI 4/15: EF 34% and findings - c/w NICM, normal RV size and function (RVEF 61%), Mild MR // b. Echo 2/15:  EF 30-35%, diff HK, ant-sept AK, Gr 2 DD, mild MR, trivial TR  //  c. Echo 5/17: EF 20-25%, severe diffuse HK, marked systolic dyssynchrony, grade 1 diastolic dysfunction, mild MR  //  d. RHC 5/17: Fick CO 2.9, RVSP 19, PASP 15, PW mean 2, low filing pressures and preserved CO   . Diabetes mellitus   . Gastritis   . History of echocardiogram    Echo 6/18: EF 30-35, diffuse HK, grade 1 diastolic  dysfunction, trivial MR, mild LAE, mild TR, no pericardial effusion  . History of nuclear stress test    Myoview 5/18: EF 49, no ischemia, inferoseptal defect c/w LBBB artifact (intermediate risk due to EF < 50).  [name] Kitchen HTN (hypertension)   . Hyperlipidemia   . NICM (nonischemic cardiomyopathy) ([place])    a. Nuclear 5/13: Normal stress nuclear study. LV Ejection Fraction: 58%  //  b. LHC 10/14: Minor luminal irregularity in prox LAD, EF 35%   . Parkinson disease ([place])   . Plantar fasciitis   . Sleep apnea    was retested and no longer had it and so d/c CPAP  . Urticaria     ROS:   All systems reviewed and negative except as noted in the  HPI.   Past Surgical History:  Procedure Laterality Date  . BUNIONECTOMY    . CARDIAC CATHETERIZATION N/A 12/16/2015   Procedure: Right Heart Cath;  Surgeon: Sherren Mocha, MD;  Location: [facility];  Service: Cardiovascular;  Laterality: N/A;  . CHOLECYSTECTOMY    . IMPLANTABLE CARDIOVERTER DEFIBRILLATOR IMPLANT  11-25-13   MDT dual chamber ICD implanted by [name] for primary prevention  . IMPLANTABLE CARDIOVERTER DEFIBRILLATOR IMPLANT N/A 11/25/2013   Procedure: IMPLANTABLE CARDIOVERTER DEFIBRILLATOR IMPLANT;  Surgeon: [name], MD;  Location: [facility] CATH LAB;  Service: Cardiovascular;  Laterality: N/A;  . LEFT AND RIGHT HEART CATHETERIZATION WITH CORONARY ANGIOGRAM N/A 05/31/2013   Procedure: LEFT AND RIGHT HEART CATHETERIZATION WITH CORONARY ANGIOGRAM;  Surgeon: [name], MD;  Location: [facility] CATH LAB;  Service: Cardiovascular;  Laterality: N/A;  . TONSILLECTOMY    . TUBAL LIGATION       Family History  Problem Relation Age of Onset  . Coronary artery disease Father        Died age 38  . Heart attack Father   . Diabetes Father   . Coronary artery disease Mother        Died age 70  . Heart attack Mother   . Diabetes Mother   . Stroke Mother   . Parkinson's disease Sister   . Heart disease Sister   . Hepatitis C Sister   . Diabetes Son 37       T1DM  . Diabetes Sister   . Heart disease Sister   . Diabetes Sister   . Heart disease Sister   . Breast cancer Neg Hx      Social History   Social History  . Marital status: Married    Spouse name: N/A  . Number of children: 2  . Years of education: N/A   Occupational History  . retired     Quarry manager   Social History Main Topics  . Smoking status: Never Smoker  . Smokeless tobacco: Never Used     Comment: exposed to smoke during child hood (parents)  . Alcohol use No  . Drug use: No  . Sexual activity: Yes    Birth control/ protection: Surgical, Post-menopausal   Other Topics Concern  . Not on file    Social History Narrative   Lives at home with husband and the dog.     BP 132/60   Pulse 74   Resp 16   Ht 5\' 2"  (1.575 m)   Wt 138 lb 6.4 oz (62.8 kg)   LMP 08/01/2000 (Approximate)   SpO2 97%   BMI 25.31 kg/m   Physical Exam:  Well appearing 66 year old woman, NAD HEENT: Unremarkable Neck:  7 cm JVD,  no thyromegally Lymphatics:  No adenopathy Back:  No CVA tenderness Lungs:  Clear, with no wheezes, rales, or rhonchi. Well-healed ICD incision HEART:  Regular rate rhythm, no murmurs, no rubs, no clicks Abd:  soft, positive bowel sounds, no organomegally, no rebound, no guarding Ext:  2 plus pulses, no edema, no cyanosis, no clubbing Skin:  No rashes no nodules Neuro:  CN II through XII intact, motor grossly intact   DEVICE  Normal device function.  See PaceArt for details.   Assess/Plan: 1. Chronic systolic heart failure - interpretation of her symptoms is difficult. Her operative all was elevated in September but is now back to normal. She will continue her medical therapy. I've encouraged her to maintain a low-sodium diet. In the background is the possibility of adding a left ventricular pacing lead. She has a biventricular ICD device in place. I will discuss this with Dr. Burt Knack. We might consider referral to the heart failure clinic. 2. ICD her Medtronic dual-chamber ICD is working normally. Her LV port is capped. We'll recheck in several months. She is approximate 7 years of battery longevity. 3. Hypertension - her blood pressure is minimally elevated today. I've asked her to reduce her salt intake.

## 2017-05-30 ENCOUNTER — Ambulatory Visit (INDEPENDENT_AMBULATORY_CARE_PROVIDER_SITE_OTHER): Payer: Medicare Other | Admitting: Internal Medicine

## 2017-05-30 ENCOUNTER — Encounter: Payer: Self-pay | Admitting: Internal Medicine

## 2017-05-30 VITALS — BP 118/72 | HR 80 | Temp 98.5°F | Ht 62.0 in | Wt 136.8 lb

## 2017-05-30 DIAGNOSIS — Z23 Encounter for immunization: Secondary | ICD-10-CM | POA: Diagnosis not present

## 2017-05-30 DIAGNOSIS — R0602 Shortness of breath: Secondary | ICD-10-CM | POA: Diagnosis not present

## 2017-05-30 DIAGNOSIS — E118 Type 2 diabetes mellitus with unspecified complications: Secondary | ICD-10-CM

## 2017-05-30 DIAGNOSIS — R922 Inconclusive mammogram: Secondary | ICD-10-CM | POA: Insufficient documentation

## 2017-05-30 LAB — POCT GLYCOSYLATED HEMOGLOBIN (HGB A1C): Hemoglobin A1C: 6.6

## 2017-05-30 MED ORDER — ALBUTEROL SULFATE (2.5 MG/3ML) 0.083% IN NEBU: 2.5000 mg | INHALATION_SOLUTION | Freq: Four times a day (QID) | RESPIRATORY_TRACT | 3 refills | Status: DC | PRN

## 2017-05-30 MED ORDER — ALBUTEROL SULFATE HFA 108 (90 BASE) MCG/ACT IN AERS: 2.0000 | INHALATION_SPRAY | RESPIRATORY_TRACT | 3 refills | Status: DC | PRN

## 2017-05-30 MED ORDER — BUDESONIDE-FORMOTEROL FUMARATE 160-4.5 MCG/ACT IN AERO: 2.0000 | INHALATION_SPRAY | Freq: Two times a day (BID) | RESPIRATORY_TRACT | 12 refills | Status: DC

## 2017-05-30 NOTE — Assessment & Plan Note (Signed)
-   Given patient's family history, would recommend MRI mammography in future if covered by insurance. Did counsel patient on higher risk of false positives. - Patient to contact her insurance to see if this is covered.

## 2017-05-30 NOTE — Assessment & Plan Note (Addendum)
-   Patient's asthma is not adequately controlled, as she has been unable to tolerate powder-based maintenance medication. She has wheezing on exam today. - Ordered symbicort for patient to try in addition to her prn albuterol.

## 2017-05-30 NOTE — Progress Notes (Signed)
[facility] Progress Note  Subjective:  Veronica Shaw is a 66 y.o. female with Parkinson disease, mild persistent asthma, chronic ischemic cardiomyopathy and systolic heart failure with ICD who presents with mammogram concern and SOB.  #Mammogram concern: - Patient concerned about mammogram report stating dense breast tissue. She wonders if she needs an MRI instead. Standard mammogram 04/27/17 without evidence of malignancy but notes heterogeneously dense tissue, which could obscure small masses.  - Denies breast pain but does have some left upper chest wall pain. No nipple discharge or rash. - Sister with history of treated breast cancer in her 8s.  ROS: No rash, no weight loss  #Dyspnea: - Patient says this is being addressed by Cardiology with consideration of adding a left ventricular pacing lead - Says her weight does not fluctuate more than a pound - Saw her Pulmonologist this summer and was given breo but could not tolerate the powder - Has taken advair in past but had tachycardia. Liked dulera but not covered by insurance - Taking albuterol a couple times a day  #T2DM: - Taking metformin 1000 mg BID - GFR > 45 ml/min 04/07/17 but mild renal impairment - She is checking CBGs multiple times a day and asks about wand monitor  Health Maintenance:  Patient asks about need for pap smear. Says she had this done across from [facility] 2 years ago.  Due for flu vaccine.    Allergies  Allergen Reactions  . Aspirin Swelling and Other (See Comments)    Other reaction(s): Other (See Comments) Causes nose bleeds *ONLY THE COATED ASA* Causes nose bleeds *ONLY THE COATED ASA*  . Penicillins Shortness Of Breath and Rash    Shortness of Breath - Throat felt like it was closing.   [name] [Conj Estrog-Medroxyprogest Ace] Shortness Of Breath    Throat swelling Throat swelling  . Ace Inhibitors Cough  . Simvastatin Other (See Comments) and Rash    Muscle aches     Social History  Substance Use Topics  . Smoking status: Never Smoker  . Smokeless tobacco: Never Used     Comment: exposed to smoke during child hood (parents)  . Alcohol use No    Objective: Blood pressure 118/72, pulse 80, temperature 98.5 F (36.9 C), temperature source Oral, height 5\' 2"  (1.575 m), weight 136 lb 12.8 oz (62.1 kg), last menstrual period 08/01/2000, SpO2 96 %. Body mass index is 25.02 kg/m. Constitutional: Well-appearing female in NAD, pleasant Cardiovascular: RRR, S1, S2, no m/r/g.  Pulmonary/Chest: Expiratory wheezes across upper lung fields. No increased WOB. Musculoskeletal: Using cane Vitals reviewed  Assessment/Plan: Dense breast tissue on mammogram - Given patient's family history, would recommend MRI mammography in future if covered by insurance. Did counsel patient on higher risk of false positives. - Patient to contact her insurance to see if this is covered.  Dyspnea - Patient's asthma is not adequately controlled, as she has been unable to tolerate powder-based maintenance medication. She has wheezing on exam today. - Ordered symbicort for patient to try in addition to her prn albuterol.   Type II diabetes mellitus with complication - G8Q < 7.5; 6.6 today.  - Continue metformin 1000 mg BID - Would check BMP in 4 months to ensure no worsening of kidney function or need for decreasing metformin dosage (last checked in September and stable)  Asked patient to find out where she had our last pap, so [facility] can request records, as she has a history of abnormal  screening but is now > 42 years of age.   Follow-up in about 1 month to see how her breathing is doing on symbicort.  Olene Floss, MD [place], PGY-3

## 2017-05-30 NOTE — Patient Instructions (Addendum)
[name],  Please start symbicort 2 puffs twice a day once this arrives from the mail order pharmacy. Continue to use albuterol as needed.  For you mammogram, ask you insurance company if they would cover breast MRI for screening. If so, I am happy to order this for you now or when you are due for your mammogram next year.  Please let us know where you had your last pap performed, and we will request those records.  You can check your blood sugars only as needed/if you feel bad, as your sugars are well controlled.  Best, Dr. Ola Spurr

## 2017-05-30 NOTE — Assessment & Plan Note (Addendum)
-   A1c < 7.5; 6.6 today.  - Continue metformin 1000 mg BID - Would check BMP in 4 months to ensure no worsening of kidney function or need for decreasing metformin dosage (last checked in September and stable)

## 2017-06-01 ENCOUNTER — Telehealth: Payer: Self-pay | Admitting: *Deleted

## 2017-06-01 NOTE — Telephone Encounter (Signed)
Patient left message on nurse line stating she contacted her insurance and they stated that it was fine to proceed with MRI if MD felt it was necessary. Patient would like breast MRI done.

## 2017-06-08 ENCOUNTER — Telehealth: Payer: Self-pay | Admitting: *Deleted

## 2017-06-08 NOTE — Telephone Encounter (Signed)
symbicort will cost pt $500, can something else be called in? Clinton Sawyer, Salome Spotted, [place]

## 2017-06-08 NOTE — Telephone Encounter (Signed)
Patient has pacemaker that is not listed as MRI safe on Medtronic's website. Left message for patient that I will touch base with her Cardiologist about this. If able, I will order breast MRI with and without contrast for dense breast tissue and high risk family history (sister had breast cancer in her 12s).  Olene Floss, MD [place], PGY-3

## 2017-06-09 ENCOUNTER — Encounter: Payer: Self-pay | Admitting: Internal Medicine

## 2017-06-09 LAB — CUP PACEART REMOTE DEVICE CHECK
Battery Remaining Longevity: 90 mo
Battery Voltage: 2.99 V
Brady Statistic AP VP Percent: 0 %
Brady Statistic AP VS Percent: 0 %
Brady Statistic AS VP Percent: 0.03 %
Brady Statistic AS VS Percent: 99.96 %
Brady Statistic RA Percent Paced: 0.01 %
Brady Statistic RV Percent Paced: 0.04 %
Date Time Interrogation Session: 20181018041605
HighPow Impedance: 76 Ohm
Implantable Lead Implant Date: 20150427
Implantable Lead Implant Date: 20150427
Implantable Lead Location: 753859
Implantable Lead Location: 753860
Implantable Lead Model: 5076
Implantable Lead Model: 6935
Implantable Pulse Generator Implant Date: 20150427
Lead Channel Impedance Value: 399 Ohm
Lead Channel Impedance Value: 4047 Ohm
Lead Channel Impedance Value: 4047 Ohm
Lead Channel Impedance Value: 4047 Ohm
Lead Channel Impedance Value: 532 Ohm
Lead Channel Impedance Value: 608 Ohm
Lead Channel Pacing Threshold Amplitude: 0.5 V
Lead Channel Pacing Threshold Amplitude: 0.625 V
Lead Channel Pacing Threshold Pulse Width: 0.4 ms
Lead Channel Pacing Threshold Pulse Width: 0.4 ms
Lead Channel Sensing Intrinsic Amplitude: 10.875 mV
Lead Channel Sensing Intrinsic Amplitude: 10.875 mV
Lead Channel Sensing Intrinsic Amplitude: 2.25 mV
Lead Channel Sensing Intrinsic Amplitude: 2.25 mV
Lead Channel Setting Pacing Amplitude: 2 V
Lead Channel Setting Pacing Amplitude: 2.5 V
Lead Channel Setting Pacing Pulse Width: 0.4 ms
Lead Channel Setting Sensing Sensitivity: 0.3 mV

## 2017-06-09 NOTE — Telephone Encounter (Signed)
Spoke with patient about filling out paperwork for medication assistance through [employer]. Will send to her through the mail. Suggested she could also check with her Pulmonology office about their availability of samples, as [facility] only has powder-based inhalers at this time, which patient has not been able to tolerate in the past.  Olene Floss, MD [place], PGY-3

## 2017-06-19 ENCOUNTER — Ambulatory Visit (INDEPENDENT_AMBULATORY_CARE_PROVIDER_SITE_OTHER): Payer: Medicare Other | Admitting: Cardiovascular Disease

## 2017-06-19 ENCOUNTER — Encounter: Payer: Self-pay | Admitting: Cardiovascular Disease

## 2017-06-19 VITALS — BP 104/56 | HR 82 | Ht 62.0 in | Wt 137.0 lb

## 2017-06-19 DIAGNOSIS — I428 Other cardiomyopathies: Secondary | ICD-10-CM | POA: Diagnosis not present

## 2017-06-19 DIAGNOSIS — I5022 Chronic systolic (congestive) heart failure: Secondary | ICD-10-CM

## 2017-06-19 DIAGNOSIS — I10 Essential (primary) hypertension: Secondary | ICD-10-CM

## 2017-06-19 NOTE — Patient Instructions (Signed)
Medication Instructions:  Your provider recommends that you continue on your current medications as directed. Please refer to the Current Medication list given to you today.    Labwork: None  Testing/Procedures: None  Follow-Up: Your provider wants you to follow-up in: 6 months with Dr. Cooper's assistant, Scott. You will receive a reminder letter in the mail two months in advance. If you don't receive a letter, please call our office to schedule the follow-up appointment.    Your provider wants you to follow-up in: 1 year with Dr. Cooper. You will receive a reminder letter in the mail two months in advance. If you don't receive a letter, please call our office to schedule the follow-up appointment.    Any Other Special Instructions Will Be Listed Below (If Applicable).     If you need a refill on your cardiac medications before your next appointment, please call your pharmacy.   

## 2017-06-19 NOTE — Progress Notes (Signed)
Cardiology Office Note Date:  06/19/2017   ID:  Veronica Shaw, DOB 25-Dec-1950, MRN [id number]  PCP:  Rogue Bussing, MD  Cardiologist:  Janne Lab, MD    Chief Complaint  Patient presents with  . chronic systolic heart failure     History of Present Illness: Veronica Shaw is a 66 y.o. female who presents for follow-up of chronic systolic heart failure.  The patient is followed for nonischemic cardiomyopathy, left bundle branch block, chronic systolic heart failure, and previous ICD implantation.  The patient is here alone today.  She has shortness of breath with activity which is long-standing.  She denies chest pain or heart palpitations.  She feels like she is having more trouble with Parkinson's disease and she has finally come to terms with this diagnosis.  She has upcoming neurology follow-up and plans on initiating therapy.  She denies edema, orthopnea, or PND.  She feels quite unsteady on her feet and ambulates with a cane even inside of the house.  She has had recurrent falls.  Past Medical History:  Diagnosis Date  . Asthma   . Chronic systolic CHF (congestive heart failure) ([place])    a. cMRI 4/15: EF 34% and findings - c/w NICM, normal RV size and function (RVEF 61%), Mild MR // b. Echo 2/15:  EF 30-35%, diff HK, ant-sept AK, Gr 2 DD, mild MR, trivial TR  //  c. Echo 5/17: EF 20-25%, severe diffuse HK, marked systolic dyssynchrony, grade 1 diastolic dysfunction, mild MR  //  d. RHC 5/17: Fick CO 2.9, RVSP 19, PASP 15, PW mean 2, low filing pressures and preserved CO   . Diabetes mellitus   . Gastritis   . History of echocardiogram    Echo 6/18: EF 30-35, diffuse HK, grade 1 diastolic dysfunction, trivial MR, mild LAE, mild TR, no pericardial effusion  . History of nuclear stress test    Myoview 5/18: EF 49, no ischemia, inferoseptal defect c/w LBBB artifact (intermediate risk due to EF < 50).  [name] Kitchen HTN (hypertension)   . Hyperlipidemia   . NICM (nonischemic  cardiomyopathy) ([place])    a. Nuclear 5/13: Normal stress nuclear study. LV Ejection Fraction: 58%  //  b. LHC 10/14: Minor luminal irregularity in prox LAD, EF 35%   . Parkinson disease ([place])   . Plantar fasciitis   . Sleep apnea    was retested and no longer had it and so d/c CPAP  . Urticaria     Past Surgical History:  Procedure Laterality Date  . BUNIONECTOMY    . CHOLECYSTECTOMY    . IMPLANTABLE CARDIOVERTER DEFIBRILLATOR IMPLANT  11-25-13   MDT dual chamber ICD implanted by [name] for primary prevention  . IMPLANTABLE CARDIOVERTER DEFIBRILLATOR IMPLANT N/A 11/25/2013   Performed by [name], MD at [facility] CATH LAB  . LEFT AND RIGHT HEART CATHETERIZATION WITH CORONARY ANGIOGRAM N/A 05/31/2013   Performed by Sherren Mocha, MD at [facility] CATH LAB  . Right Heart Cath N/A 12/16/2015   Performed by Sherren Mocha, MD at [facility] CV LAB  . TONSILLECTOMY    . TUBAL LIGATION      Current Outpatient Medications  Medication Sig Dispense Refill  . albuterol (PROVENTIL HFA;VENTOLIN HFA) 108 (90 Base) MCG/ACT inhaler Inhale 2 puffs into the lungs every 4 (four) hours as needed for wheezing or shortness of breath. 18 g 3  . albuterol (PROVENTIL) (2.5 MG/3ML) 0.083% nebulizer solution Take 3 mLs (2.5 mg total) by nebulization  every 6 (six) hours as needed for wheezing or shortness of breath. 75 mL 3  . budesonide-formoterol (SYMBICORT) 160-4.5 MCG/ACT inhaler Inhale 2 puffs into the lungs 2 (two) times daily. 1 Inhaler 12  . Calcium Carb-Cholecalciferol 600-800 MG-UNIT TABS Take 1 tablet by mouth daily. 90 tablet 3  . cyclobenzaprine (FLEXERIL) 5 MG tablet Take 1 tablet (5 mg total) by mouth 3 (three) times daily as needed for muscle spasms. 30 tablet 2  . fluticasone (FLONASE) 50 MCG/ACT nasal spray Place 2 sprays into both nostrils daily. 16 g 1  . fluticasone furoate-vilanterol (BREO ELLIPTA) 200-25 MCG/INH AEPB Inhale 1 puff into the lungs daily. 14 each 0  . furosemide (LASIX) 40 MG  tablet Take 20 mg by mouth daily as needed for fluid (For swelling).     . metFORMIN (GLUCOPHAGE) 500 MG tablet TAKE 2 TABLETS TWICE DAILY WITH A MEAL 360 tablet 3  . metoprolol succinate (TOPROL-XL) 50 MG 24 hr tablet TAKE 1 AND 1/2 TABLETS DAILY FOR 1 WEEK, THEN INCREASE TO 2 TABLETS DAILY AS DIRECTED (DOSE CHANGE) 180 tablet 3  . pantoprazole (PROTONIX) 40 MG tablet     . potassium chloride (K-DUR,KLOR-CON) 10 MEQ tablet Take 1 tablet (10 mEq total) by mouth daily as needed (Take with lasix as needed for swelling). 30 tablet 6  . losartan (COZAAR) 25 MG tablet Take 1 tablet (25 mg total) by mouth daily. 90 tablet 3  . spironolactone (ALDACTONE) 25 MG tablet Take 0.5 tablets (12.5 mg total) by mouth daily. 90 tablet 3   No current facility-administered medications for this visit.     Allergies:   Aspirin; Penicillins; Prempro [conj estrog-medroxyprogest ace]; Ace inhibitors; and Simvastatin   Social History:  The patient  reports that  has never smoked. she has never used smokeless tobacco. She reports that she does not drink alcohol or use drugs.   Family History:  The patient's  family history includes Coronary artery disease in her father and mother; Diabetes in her father, mother, sister, and sister; Diabetes (age of onset: 68) in her son; Heart attack in her father and mother; Heart disease in her sister, sister, and sister; Hepatitis C in her sister; Parkinson's disease in her sister; Stroke in her mother.    ROS:  Please see the history of present illness.  Otherwise, review of systems is positive for muscle pain, dizziness, fatigue, balance problems.  All other systems are reviewed and negative.    PHYSICAL EXAM: VS:  BP (!) 104/56   Pulse 82   Ht 5\' 2"  (1.575 m)   Wt 137 lb (62.1 kg)   LMP 08/01/2000 (Approximate)   BMI 25.06 kg/m  , BMI Body mass index is 25.06 kg/m. GEN: Well nourished, well developed, in no acute distress  HEENT: normal  Neck: no JVD, no masses. No  carotid bruits Cardiac: RRR without murmur or gallop                Respiratory:  clear to auscultation bilaterally, normal work of breathing GI: soft, nontender, nondistended, + BS MS: no deformity or atrophy  Ext: no pretibial edema, pedal pulses 2+= bilaterally Skin: warm and dry, no rash Neuro:  Strength and sensation are intact Psych: euthymic mood, full affect  EKG:  EKG is not ordered today.  Recent Labs: 07/21/2016: ALT 12; TSH 2.18 12/28/2016: NT-Pro BNP 244 04/07/2017: BUN 23; Creatinine, Ser 1.18; Hemoglobin 13.3; Platelets 197; Potassium 4.1; Sodium 142   Lipid Panel  Component Value Date/Time   CHOL 172 05/31/2013 0435   TRIG 142 05/31/2013 0435   HDL 49 05/31/2013 0435   CHOLHDL 3.5 05/31/2013 0435   VLDL 28 05/31/2013 0435   LDLCALC 95 05/31/2013 0435   LDLDIRECT 111 (H) 02/10/2015 1145      Wt Readings from Last 3 Encounters:  06/19/17 137 lb (62.1 kg)  05/30/17 136 lb 12.8 oz (62.1 kg)  05/26/17 138 lb 6.4 oz (62.8 kg)     Cardiac Studies Reviewed: 2D Echo 01-11-2017: Study Conclusions  - Left ventricle: The cavity size was normal. Wall thickness was   normal. Systolic function was moderately to severely reduced. The   estimated ejection fraction was in the range of 30% to 35%.   Diffuse hypokinesis. Doppler parameters are consistent with   abnormal left ventricular relaxation (grade 1 diastolic   dysfunction). - Aortic valve: Trileaflet; mildly thickened, mildly calcified   leaflets. - Mitral valve: There was trivial regurgitation. - Left atrium: The atrium was mildly dilated. Volume, ES, (1-plane   Simpson&'s, A2C): 35.1 ml. - Right ventricle: Pacer wire or catheter noted in right ventricle. - Right atrium: Pacer wire or catheter noted in right atrium. - Tricuspid valve: There was mild regurgitation.  Impressions:  - Similar to prior study (EF was 25%)  ASSESSMENT AND PLAN: Chronic systolic heart failure, NYHA functional class II-III  symptoms.  No evidence of volume overload on exam.  Seems to be equally limited by Parkinson symptoms at this point.  Medical program is reviewed and should be continued with use of losartan, metoprolol succinate, and Spironolactone.  I would like her to follow-up in 6 months with an APP and I will see her back in 1 year. Overall appears stable from a cardiac perspective.   Current medicines are reviewed with the patient today.  The patient does not have concerns regarding medicines.  Labs/ tests ordered today include:  No orders of the defined types were placed in this encounter.   Disposition:   FU 6 months APP visit  Signed, Janne Lab, MD  06/19/2017 10:52 AM    [facility] [place], [place], [place]  [address] Phone: [phone]; Fax: [phone]

## 2017-06-21 ENCOUNTER — Encounter: Payer: Self-pay | Admitting: Cardiovascular Disease

## 2017-06-26 ENCOUNTER — Ambulatory Visit (INDEPENDENT_AMBULATORY_CARE_PROVIDER_SITE_OTHER): Payer: Medicare Other

## 2017-06-26 DIAGNOSIS — I5022 Chronic systolic (congestive) heart failure: Secondary | ICD-10-CM | POA: Diagnosis not present

## 2017-06-26 DIAGNOSIS — Z95810 Presence of automatic (implantable) cardiac defibrillator: Secondary | ICD-10-CM | POA: Diagnosis not present

## 2017-06-27 ENCOUNTER — Telehealth: Payer: Self-pay

## 2017-06-27 ENCOUNTER — Telehealth: Payer: Self-pay | Admitting: *Deleted

## 2017-06-27 NOTE — Progress Notes (Signed)
EPIC Encounter for ICM Monitoring  Patient Name: Veronica Shaw is a 66 y.o. female Date: 06/27/2017 Primary Care Physican: Rogue Bussing, MD Primary Cardiologist:Cooper/Weaver, PA Electrophysiologist: Druscilla Brownie Weight:Previous weight 144 lbs          Attempted call to patient and unable to reach.  Left detailed message regarding transmission.  Transmission reviewed.    Thoracic impedance normal.  Prescribed dosage: Furosemide 20 mg 1 tablet daily as needed. Potassium 10 mEq 1 tablet daily as needed (taken with Lasix).   Labs: 04/07/2017 Creatinine 1.18, BUN 23, Potassium 4.1, Sodium 142, EGFR 46-54 04/04/2017 Creatinine 1.04, BUN 21, Potassium 4.6, Sodium 143, EGFR 55-64 01/25/2017 Creatinine 1.17, BUN 22, Potassium 4.5, Sodium 142, EGFR 48-56 12/28/2016 Creatinine 1.05, BUN 20, Potassium 4.2, Sodium 143, EGFR 55-64, NT-Pro BNP 244 12/21/2016 Creatinine 1.33, BUN 24, Potassium 5.1, Sodium 140 07/21/2016 Creatinine 1.18, BUN 17, Potassium 4.1, Sodium 139, EGFR 48-55  Recommendations: Left voice mail with ICM number and encouraged to call if experiencing any fluid symptoms.  Follow-up plan: ICM clinic phone appointment on 07/27/2017.    Copy of ICM check sent to Dr. Lovena Le.   3 month ICM trend: 06/26/2017    1 Year ICM trend:       [name], RN 06/27/2017 7:55 AM

## 2017-06-27 NOTE — Telephone Encounter (Signed)
Remote ICM transmission received.  Attempted call to patient and left detailed message per DPR regarding transmission and next ICM scheduled for 07/27/2017.  Advised to return call for any fluid symptoms or questions.    

## 2017-06-27 NOTE — Telephone Encounter (Signed)
Received call from [facility].  Pt is there for a cleaning and they want to know if she needs antibiotics prior.    Spoke with Dr. McDiarmid, no need for antibiotics.  LMOVM of Dental Hygiene of the above message. Chanley Mcenery, Salome Spotted, CMA

## 2017-07-04 ENCOUNTER — Telehealth: Payer: Self-pay | Admitting: Internal Medicine

## 2017-07-04 DIAGNOSIS — R070 Pain in throat: Secondary | ICD-10-CM

## 2017-07-04 NOTE — Telephone Encounter (Signed)
Pt would like a referral to an ear nose and throat dr. She says she thinks she went to one before but wants a new one. Pt didn't like the other one

## 2017-07-05 ENCOUNTER — Ambulatory Visit: Payer: Medicare Other | Admitting: Neurology

## 2017-07-07 NOTE — Telephone Encounter (Signed)
Called patient to inquire about reason for referral request. She has concern about persistent sore throat and appearance of tonsillar tissue s/p tonsillectomy. Requests specialist evaluation. Placed referral.  Olene Floss, MD [facility], PGY-3

## 2017-07-18 ENCOUNTER — Other Ambulatory Visit: Payer: Self-pay | Admitting: Internal Medicine

## 2017-07-19 ENCOUNTER — Ambulatory Visit: Payer: Medicare Other | Admitting: Internal Medicine

## 2017-07-27 ENCOUNTER — Ambulatory Visit (INDEPENDENT_AMBULATORY_CARE_PROVIDER_SITE_OTHER): Payer: Medicare Other

## 2017-07-27 ENCOUNTER — Other Ambulatory Visit: Payer: Self-pay | Admitting: Internal Medicine

## 2017-07-27 DIAGNOSIS — I5022 Chronic systolic (congestive) heart failure: Secondary | ICD-10-CM

## 2017-07-27 DIAGNOSIS — Z95810 Presence of automatic (implantable) cardiac defibrillator: Secondary | ICD-10-CM | POA: Diagnosis not present

## 2017-07-27 MED ORDER — ALBUTEROL SULFATE (2.5 MG/3ML) 0.083% IN NEBU: 2.5000 mg | INHALATION_SOLUTION | Freq: Four times a day (QID) | RESPIRATORY_TRACT | 3 refills | Status: DC | PRN

## 2017-07-28 ENCOUNTER — Other Ambulatory Visit: Payer: Self-pay | Admitting: Internal Medicine

## 2017-07-28 ENCOUNTER — Other Ambulatory Visit: Payer: Self-pay | Admitting: *Deleted

## 2017-07-28 MED ORDER — ALBUTEROL SULFATE (2.5 MG/3ML) 0.083% IN NEBU: 2.5000 mg | INHALATION_SOLUTION | Freq: Four times a day (QID) | RESPIRATORY_TRACT | 2 refills | Status: DC | PRN

## 2017-07-28 MED ORDER — RANITIDINE HCL 150 MG PO TABS: 150.0000 mg | ORAL_TABLET | Freq: Two times a day (BID) | ORAL | 1 refills | Status: DC

## 2017-07-28 NOTE — Telephone Encounter (Signed)
Patient calling in for refill of ranitidine 150 mg at [facility]. States this is the only med that helps with acid reflux. Protonix has not helped. Hubbard Hartshorn, RN, BSN

## 2017-07-28 NOTE — Progress Notes (Signed)
EPIC Encounter for ICM Monitoring  Patient Name: Veronica Shaw is a 66 y.o. female Date: 07/28/2017 Primary Care Physican: Rogue Bussing, MD Primary Cardiologist:Cooper/Weaver, PA Electrophysiologist: Lovena Le Dry Weight: 144 lbs                  Heart Failure questions reviewed, pt asymptomatic.    Thoracic impedance normal.  She took PRN Furosemide for a few days last week  Prescribed dosage: Furosemide 20 mg 1 tablet daily as needed. Potassium 10 mEq 1 tablet daily as needed (taken with Lasix).   Labs: 04/07/2017 Creatinine 1.18, BUN 23, Potassium 4.1, Sodium 142, EGFR 46-54 04/04/2017 Creatinine 1.04, BUN 21, Potassium 4.6, Sodium 143, EGFR 55-64 01/25/2017 Creatinine 1.17, BUN 22, Potassium 4.5, Sodium 142, EGFR 48-56 12/28/2016 Creatinine 1.05, BUN 20, Potassium 4.2, Sodium 143, EGFR 55-64, NT-Pro BNP 244 12/21/2016 Creatinine 1.33, BUN 24, Potassium 5.1, Sodium 140 07/21/2016 Creatinine 1.18, BUN 17, Potassium 4.1, Sodium 139, EGFR 48-55  Recommendations: No changes.   Encouraged to call for fluid symptoms.  Follow-up plan: ICM clinic phone appointment on 08/28/2017.    Copy of ICM check sent to Dr. Lovena Le.   3 month ICM trend: 07/27/2017    1 Year ICM trend:       [name], RN 07/28/2017 11:37 AM

## 2017-08-02 ENCOUNTER — Telehealth: Payer: Self-pay | Admitting: Pulmonary Disease

## 2017-08-02 NOTE — Telephone Encounter (Signed)
Called and spoke with pt who stated that she has been having chest tightness, wheezing, SOB that all started 07/28/17.   Scheduled an appt tomorrow, 08/03/17 with Dr. Annamaria Boots at 1:30. Nothing further needed.

## 2017-08-03 ENCOUNTER — Ambulatory Visit (INDEPENDENT_AMBULATORY_CARE_PROVIDER_SITE_OTHER): Payer: Medicare Other | Admitting: Internal Medicine

## 2017-08-03 ENCOUNTER — Encounter: Payer: Self-pay | Admitting: Internal Medicine

## 2017-08-03 ENCOUNTER — Telehealth: Payer: Self-pay | Admitting: Internal Medicine

## 2017-08-03 VITALS — BP 112/60 | HR 90 | Ht 62.0 in | Wt 135.4 lb

## 2017-08-03 DIAGNOSIS — J4541 Moderate persistent asthma with (acute) exacerbation: Secondary | ICD-10-CM | POA: Diagnosis not present

## 2017-08-03 DIAGNOSIS — J45901 Unspecified asthma with (acute) exacerbation: Secondary | ICD-10-CM | POA: Diagnosis not present

## 2017-08-03 DIAGNOSIS — Z95810 Presence of automatic (implantable) cardiac defibrillator: Secondary | ICD-10-CM

## 2017-08-03 DIAGNOSIS — J45909 Unspecified asthma, uncomplicated: Secondary | ICD-10-CM | POA: Insufficient documentation

## 2017-08-03 MED ORDER — LEVALBUTEROL HCL 0.63 MG/3ML IN NEBU
0.6300 mg | INHALATION_SOLUTION | Freq: Once | RESPIRATORY_TRACT | Status: AC
  Administered 2017-08-03: 0.63 mg via RESPIRATORY_TRACT

## 2017-08-03 MED ORDER — METHYLPREDNISOLONE ACETATE 80 MG/ML IJ SUSP
80.0000 mg | Freq: Once | INTRAMUSCULAR | Status: AC
  Administered 2017-08-03: 80 mg via INTRAMUSCULAR

## 2017-08-03 MED ORDER — ALBUTEROL SULFATE (2.5 MG/3ML) 0.083% IN NEBU: 2.5000 mg | INHALATION_SOLUTION | Freq: Four times a day (QID) | RESPIRATORY_TRACT | 12 refills | Status: DC | PRN

## 2017-08-03 NOTE — Patient Instructions (Signed)
Order neb xop 0.63     Dx asthma exacerbation            Depo 60   Order-  DME to provide neb solution for dx asthma moderate persistent -- Medicare                 Script printed to refill albuterol neb solution through DME

## 2017-08-03 NOTE — Assessment & Plan Note (Signed)
She is protective of her chest wall over the device. Pulse was regular on exam at this visit.

## 2017-08-03 NOTE — Assessment & Plan Note (Signed)
Probably a viral or weather triggered exacerbation of her chronic asthma. Plan-nebulizer treatments Xopenex, Depo-Medrol. Refill for nebulizer solution, switching to get it provided through a DME company for Medicare coverage if possible.

## 2017-08-03 NOTE — Progress Notes (Signed)
08/03/17-Acute visit-67 year old female never smoker followed for chronic asthma ---Increased SOB, coughing up yellowish phlegm. Increased wheezing for the past 4 days.  Medical problem list includes allergic rhinitis, chronic systolic heart failure/ AICD, rheumatoid arthritis, DM 2 with peripheral neuropathy, HBP, GERD, Parkinson's disease She doesn't feel that she has an obvious infection, without fever or sore throat. Typical exacerbation of her asthma possibly due to weather changes. Phlegm is white. Out of nebulizer solution and we discussed possibility of getting it covered by Medicaid through a DME company.    ROS-see HPI   + = positive Constitutional:    weight loss, night sweats, fevers, chills, fatigue, lassitude. HEENT:    headaches, difficulty swallowing, tooth/dental problems, sore throat,       sneezing, itching, ear ache, +nasal congestion, post nasal drip, snoring CV:    chest pain, orthopnea, PND, swelling in lower extremities, anasarca,                                  dizziness, palpitations Resp:   +shortness of breath with exertion or at rest.                +productive cough,   non-productive cough, coughing up of blood.              change in color of mucus.  +wheezing.   Skin:    rash or lesions. GI:  No-   heartburn, indigestion, abdominal pain, nausea, vomiting, diarrhea,                 change in bowel habits, loss of appetite GU: dysuria, change in color of urine, no urgency or frequency.   flank pain. MS:   joint pain, stiffness, decreased range of motion, back pain. Neuro-     nothing unusual Psych:  change in mood or affect.  depression or anxiety.   memory loss.  OBJ- Physical Exam General- Alert, Oriented, Affect-appropriate, Distress- none acute Skin- rash-none, lesions- none, excoriation- none Lymphadenopathy- none Head- atraumatic            Eyes- Gross vision intact, PERRLA, conjunctivae and secretions clear            Ears- Hearing, canals-normal      Nose- Clear, no-Septal dev, mucus, polyps, erosion, perforation             Throat- Mallampati II , mucosa clear , drainage- none, tonsils- atrophic Neck- flexible , trachea midline, no stridor , thyroid nl, carotid no bruit Chest - symmetrical excursion , unlabored           Heart/CV- RRR , no murmur , no gallop  , no rub, nl s1 s2                           - JVD- none , edema- none, stasis changes- none, varices- none           Lung- , wheeze+, cough- none , dullness-none, rub- none           Chest wall- + AICD LUA chest Abd-  Br/ Gen/ Rectal- Not done, not indicated Extrem- cyanosis- none, clubbing, none, atrophy- none, strength- nl Neuro- grossly intact to observation

## 2017-08-03 NOTE — Telephone Encounter (Signed)
Will leave message in triage to f/u on 08/04/16, as [facility] is currently closed.

## 2017-08-04 NOTE — Telephone Encounter (Signed)
Spoke with patient Pt concerned and needing to know if albuterol nebulizer solution sent to home delivery.  Instructions      Return in about 3 months (around 11/01/2017) for Dr [place] Bells- asthma f/u.  Order neb xop 0.63     Dx asthma exacerbation            Depo 22   Order-  DME to provide neb solution for dx asthma moderate persistent -- Medicare                 Script printed to refill albuterol neb solution through DME     After Visit Summary (Printed 08/03/2017)   This script was printed to refill the albuterol nebulizer solution through pt's DME company on 08/03/17 Nothing further needed

## 2017-08-04 NOTE — Telephone Encounter (Signed)
Patient is calling regarding this as well and is requesting we call her, CB is [phone].

## 2017-08-07 ENCOUNTER — Telehealth: Payer: Self-pay | Admitting: Internal Medicine

## 2017-08-07 NOTE — Telephone Encounter (Signed)
Spoke with pharmacist Rose at [facility], albuterol neb has been ran through [facility] and is now being covered.  Nothing further needed.

## 2017-08-28 ENCOUNTER — Ambulatory Visit (INDEPENDENT_AMBULATORY_CARE_PROVIDER_SITE_OTHER): Payer: Medicare Other | Admitting: *Deleted

## 2017-08-28 DIAGNOSIS — I428 Other cardiomyopathies: Secondary | ICD-10-CM

## 2017-08-28 DIAGNOSIS — I5022 Chronic systolic (congestive) heart failure: Secondary | ICD-10-CM | POA: Diagnosis not present

## 2017-08-28 DIAGNOSIS — Z95810 Presence of automatic (implantable) cardiac defibrillator: Secondary | ICD-10-CM | POA: Diagnosis not present

## 2017-08-28 NOTE — Progress Notes (Signed)
Remote ICD transmission.   

## 2017-08-29 NOTE — Progress Notes (Signed)
EPIC Encounter for ICM Monitoring  Patient Name: Veronica Shaw is a 67 y.o. female Date: 08/29/2017 Primary Care Physican: Rogue Bussing, MD Primary Cardiologist:Cooper/Weaver, PA Electrophysiologist: Lovena Le Dry Weight: 135 lbs      Heart Failure questions reviewed, pt asymptomatic.   Thoracic impedance normal.  Prescribed dosage: Furosemide 20 mg 1 tablet daily as needed. Potassium 10 mEq 1 tablet daily as needed (taken with Lasix).   Labs: 04/07/2017 Creatinine 1.18, BUN 23, Potassium 4.1, Sodium 142, EGFR 46-54 04/04/2017 Creatinine 1.04, BUN 21, Potassium 4.6, Sodium 143, EGFR 55-64 01/25/2017 Creatinine 1.17, BUN 22, Potassium 4.5, Sodium 142, EGFR 48-56 12/28/2016 Creatinine 1.05, BUN 20, Potassium 4.2, Sodium 143, EGFR 55-64, NT-Pro BNP 244 12/21/2016 Creatinine 1.33, BUN 24, Potassium 5.1, Sodium 140 07/21/2016 Creatinine 1.18, BUN 17, Potassium 4.1, Sodium 139, EGFR 48-55  Recommendations: No changes.   Encouraged to call for fluid symptoms.  Follow-up plan: ICM clinic phone appointment on 09/29/2017.    Copy of ICM check sent to Dr. Lovena Le.   3 month ICM trend: 08/28/2017    1 Year ICM trend:       [name], RN 08/29/2017 3:52 PM

## 2017-08-30 LAB — CUP PACEART REMOTE DEVICE CHECK
Battery Remaining Longevity: 84 mo
Battery Voltage: 2.99 V
Brady Statistic AP VP Percent: 0 %
Brady Statistic AP VS Percent: 0 %
Brady Statistic AS VP Percent: 0.04 %
Brady Statistic AS VS Percent: 99.96 %
Brady Statistic RA Percent Paced: 0 %
Brady Statistic RV Percent Paced: 0.04 %
Date Time Interrogation Session: 20190128093723
HighPow Impedance: 67 Ohm
Implantable Lead Implant Date: 20150427
Implantable Lead Implant Date: 20150427
Implantable Lead Location: 753859
Implantable Lead Location: 753860
Implantable Lead Model: 5076
Implantable Lead Model: 6935
Implantable Pulse Generator Implant Date: 20150427
Lead Channel Impedance Value: 342 Ohm
Lead Channel Impedance Value: 4047 Ohm
Lead Channel Impedance Value: 4047 Ohm
Lead Channel Impedance Value: 4047 Ohm
Lead Channel Impedance Value: 532 Ohm
Lead Channel Impedance Value: 608 Ohm
Lead Channel Pacing Threshold Amplitude: 0.5 V
Lead Channel Pacing Threshold Amplitude: 0.625 V
Lead Channel Pacing Threshold Pulse Width: 0.4 ms
Lead Channel Pacing Threshold Pulse Width: 0.4 ms
Lead Channel Sensing Intrinsic Amplitude: 12 mV
Lead Channel Sensing Intrinsic Amplitude: 12 mV
Lead Channel Sensing Intrinsic Amplitude: 2.5 mV
Lead Channel Sensing Intrinsic Amplitude: 2.5 mV
Lead Channel Setting Pacing Amplitude: 2 V
Lead Channel Setting Pacing Amplitude: 2.5 V
Lead Channel Setting Pacing Pulse Width: 0.4 ms
Lead Channel Setting Sensing Sensitivity: 0.3 mV

## 2017-08-31 ENCOUNTER — Encounter: Payer: Self-pay | Admitting: Cardiology

## 2017-09-17 ENCOUNTER — Other Ambulatory Visit: Payer: Self-pay

## 2017-09-17 ENCOUNTER — Encounter (HOSPITAL_COMMUNITY): Payer: Self-pay | Admitting: *Deleted

## 2017-09-17 ENCOUNTER — Ambulatory Visit (HOSPITAL_COMMUNITY)
Admission: EM | Admit: 2017-09-17 | Discharge: 2017-09-17 | Disposition: A | Discharge status: Home / Self Care | Payer: Medicare Other | Attending: Physician Assistant | Admitting: Physician Assistant

## 2017-09-17 DIAGNOSIS — J069 Acute upper respiratory infection, unspecified: Secondary | ICD-10-CM | POA: Diagnosis not present

## 2017-09-17 DIAGNOSIS — B9789 Other viral agents as the cause of diseases classified elsewhere: Secondary | ICD-10-CM | POA: Diagnosis not present

## 2017-09-17 MED ORDER — HYDROCOD POLST-CPM POLST ER 10-8 MG/5ML PO SUER: 5.0000 mL | Freq: Two times a day (BID) | ORAL | 0 refills | Status: DC | PRN

## 2017-09-17 MED ORDER — BENZONATATE 100 MG PO CAPS: 100.0000 mg | ORAL_CAPSULE | Freq: Three times a day (TID) | ORAL | 0 refills | Status: DC

## 2017-09-17 NOTE — Discharge Instructions (Signed)
I think you most likely have some sort of respiratory virus that is driving your cough.  Take medications as I have prescribed them.  If you feel you are getting worse please come back.  I hope you get the feeling better soon.

## 2017-09-17 NOTE — ED Provider Notes (Signed)
09/17/2017 5:03 PM   DOB: 08/25/1950 / MRN: [id number]  SUBJECTIVE:  Veronica Shaw is a 67 y.o. female presenting for cough and sore throat that is been present for about 6 days.  She feels that the sore throat is secondary to the coughing.  The cough is worse at night.  She has a history of asthma.  She has a history of CHF with the most recent echo 25-35% and she has a defibrillator in place.  She takes her cardiac medications as prescribed and does not miss doses.  She denies leg swelling today.  She denies shortness of breath.  She denies weight gain.  Her husband has been sick with a cold at home.  She is allergic to aspirin; penicillins; prempro [conj estrog-medroxyprogest ace]; ace inhibitors; and simvastatin.   She  has a past medical history of Asthma, Chronic systolic CHF (congestive heart failure) ([place]), Diabetes mellitus, Gastritis, History of echocardiogram, History of nuclear stress test, HTN (hypertension), Hyperlipidemia, NICM (nonischemic cardiomyopathy) ([place]), Parkinson disease ([place]), Plantar fasciitis, Sleep apnea, and Urticaria.    She  reports that  has never smoked. she has never used smokeless tobacco. She reports that she does not drink alcohol or use drugs. She  reports that she currently engages in sexual activity. The patient  has a past surgical history that includes Tubal ligation; Cholecystectomy; Bunionectomy; Implantable cardioverter defibrillator implant (11-25-13); left and right heart catheterization with coronary angiogram (N/A, 05/31/2013); implantable cardioverter defibrillator implant (N/A, 11/25/2013); Tonsillectomy; and Cardiac catheterization (N/A, 12/16/2015).  Her family history includes Coronary artery disease in her father and mother; Diabetes in her father, mother, sister, and sister; Diabetes (age of onset: 21) in her son; Heart attack in her father and mother; Heart disease in her sister, sister, and sister; Hepatitis C in her sister; Parkinson's disease in her  sister; Stroke in her mother.  Review of Systems  Constitutional: Negative for chills, diaphoresis and fever.  Eyes: Negative.   Respiratory: Positive for cough and sputum production. Negative for hemoptysis, shortness of breath and wheezing.   Cardiovascular: Negative for chest pain, orthopnea and leg swelling.  Gastrointestinal: Negative for nausea.  Skin: Negative for rash.  Neurological: Negative for dizziness, sensory change, speech change, focal weakness and headaches.    OBJECTIVE:  BP (!) 117/53   Pulse 80   Temp 98.1 F (36.7 C) (Oral)   Resp (!) 22   LMP 08/01/2000 (Approximate)   SpO2 99%   Wt Readings from Last 3 Encounters:  08/03/17 135 lb 6.4 oz (61.4 kg)  06/19/17 137 lb (62.1 kg)  05/30/17 136 lb 12.8 oz (62.1 kg)   Pulse Readings from Last 3 Encounters:  09/17/17 80  08/03/17 90  06/19/17 82   BP Readings from Last 3 Encounters:  09/17/17 (!) 117/53  08/03/17 112/60  06/19/17 (!) 104/56       Physical Exam  Constitutional: She is active.  Non-toxic appearance.  Cardiovascular: Normal rate, regular rhythm, S1 normal, S2 normal, normal heart sounds and intact distal pulses. Exam reveals no gallop, no friction rub and no decreased pulses.  No murmur heard. Pulmonary/Chest: Effort normal. No stridor. No tachypnea. No respiratory distress. She has no wheezes. She has no rales.  Abdominal: She exhibits no distension.  Musculoskeletal: She exhibits no edema.  Neurological: She is alert.  Skin: Skin is warm and dry. She is not diaphoretic. No pallor.    No results found for this or any previous visit (from the past 72 hour(s)).  No results found.  ASSESSMENT AND PLAN:  No orders of the defined types were placed in this encounter.    Viral URI with cough: Her exam is within normal limits.  Her vitals are normal and she is not volume up.  I suspect she has a virus and will treat to that hand.      The patient is advised to call or return to  clinic if she does not see an improvement in symptoms, or to seek the care of the closest emergency department if she worsens with the above plan.   Philis Fendt, MHS, PA-C 09/17/2017 5:03 PM    Tereasa Coop, PA-C 09/17/17 1705

## 2017-09-17 NOTE — ED Triage Notes (Signed)
C/O cough, SOB, chills x 3 days.  Unsure if fevers.  C/O "croupy cough".

## 2017-09-22 ENCOUNTER — Other Ambulatory Visit: Payer: Self-pay

## 2017-09-22 ENCOUNTER — Other Ambulatory Visit: Payer: Self-pay | Admitting: Internal Medicine

## 2017-09-22 MED ORDER — BENZONATATE 100 MG PO CAPS: 100.0000 mg | ORAL_CAPSULE | Freq: Three times a day (TID) | ORAL | 0 refills | Status: DC

## 2017-09-22 MED ORDER — FUROSEMIDE 40 MG PO TABS: 20.0000 mg | ORAL_TABLET | Freq: Every day | ORAL | 3 refills | Status: DC | PRN

## 2017-09-22 NOTE — Telephone Encounter (Signed)
Patient left message on nurse line requesting refill on [place]. Danley Danker, RN [facility] RN)

## 2017-09-28 DIAGNOSIS — L648 Other androgenic alopecia: Secondary | ICD-10-CM | POA: Diagnosis not present

## 2017-09-28 DIAGNOSIS — L718 Other rosacea: Secondary | ICD-10-CM | POA: Diagnosis not present

## 2017-09-29 ENCOUNTER — Ambulatory Visit (INDEPENDENT_AMBULATORY_CARE_PROVIDER_SITE_OTHER): Payer: Medicare Other

## 2017-09-29 ENCOUNTER — Telehealth: Payer: Self-pay

## 2017-09-29 DIAGNOSIS — I5022 Chronic systolic (congestive) heart failure: Secondary | ICD-10-CM | POA: Diagnosis not present

## 2017-09-29 DIAGNOSIS — Z95810 Presence of automatic (implantable) cardiac defibrillator: Secondary | ICD-10-CM | POA: Diagnosis not present

## 2017-09-29 NOTE — Telephone Encounter (Signed)
Remote ICM transmission received.  Attempted call to patient and left message to return call. 

## 2017-09-29 NOTE — Progress Notes (Signed)
EPIC Encounter for ICM Monitoring  Patient Name: Veronica Shaw is a 67 y.o. female Date: 09/29/2017 Primary Care Physican: Rogue Bussing, MD Primary Cardiologist:Cooper Electrophysiologist: Lovena Le Dry Weight: Previous weight 135 lbs     Attempted call to patient and unable to reach.  Left message to return call.  Transmission reviewed.    Thoracic impedance abnormal suggesting fluid accumulation since 09/14/2017.  Prescribed dosage: Furosemide 20 mg 1 tablet daily as needed. Potassium 10 mEq 1 tablet daily as needed (taken with Lasix).   Labs: 04/07/2017 Creatinine 1.18, BUN 23, Potassium 4.1, Sodium 142, EGFR 46-54 04/04/2017 Creatinine 1.04, BUN 21, Potassium 4.6, Sodium 143, EGFR 55-64 01/25/2017 Creatinine 1.17, BUN 22, Potassium 4.5, Sodium 142, EGFR 48-56 12/28/2016 Creatinine 1.05, BUN 20, Potassium 4.2, Sodium 143, EGFR 55-64, NT-Pro BNP 244 12/21/2016 Creatinine 1.33, BUN 24, Potassium 5.1, Sodium 140 07/21/2016 Creatinine 1.18, BUN 17, Potassium 4.1, Sodium 139, EGFR 48-55  Recommendations: NONE - Unable to reach.    Follow-up plan: ICM clinic phone appointment on 10/05/2017 to recheck fluid levels.    Copy of ICM check sent to Dr. Burt Knack and Dr. Lovena Le.   3 month ICM trend: 09/28/2017    1 Year ICM trend:       [name], RN 09/29/2017 9:12 AM

## 2017-10-05 ENCOUNTER — Ambulatory Visit (INDEPENDENT_AMBULATORY_CARE_PROVIDER_SITE_OTHER): Payer: Self-pay

## 2017-10-05 DIAGNOSIS — Z95810 Presence of automatic (implantable) cardiac defibrillator: Secondary | ICD-10-CM

## 2017-10-05 DIAGNOSIS — I5022 Chronic systolic (congestive) heart failure: Secondary | ICD-10-CM

## 2017-10-05 NOTE — Progress Notes (Signed)
EPIC Encounter for ICM Monitoring  Patient Name: Veronica Shaw is a 67 y.o. female Date: 10/05/2017 Primary Care Physican: Rogue Bussing, MD Primary Cardiologist:Cooper Electrophysiologist: Lovena Le Dry Weight: Previous weight 135lbs      Heart Failure questions reviewed, pt reported she has been coughing with shortness of breath since about 09/17/2017 and went to urgent care.  She said she still has shortness of breath and not feeling very well.    Thoracic impedance returned to normal on 09/29/2017.  Prescribed dosage: Furosemide 20 mg 1 tablet daily as needed. Potassium 10 mEq 1 tablet daily as needed (taken with Lasix).   Labs: 04/07/2017 Creatinine 1.18, BUN 23, Potassium 4.1, Sodium 142, EGFR 46-54 04/04/2017 Creatinine 1.04, BUN 21, Potassium 4.6, Sodium 143, EGFR 55-64 01/25/2017 Creatinine 1.17, BUN 22, Potassium 4.5, Sodium 142, EGFR 48-56 12/28/2016 Creatinine 1.05, BUN 20, Potassium 4.2, Sodium 143, EGFR 55-64, NT-Pro BNP 244 12/21/2016 Creatinine 1.33, BUN 24, Potassium 5.1, Sodium 140 07/21/2016 Creatinine 1.18, BUN 17, Potassium 4.1, Sodium 139, EGFR 48-55  Recommendations:  She is taking Furosemide PRN as needed and taking it more often in the last month.  Advised to make an appointment with Dr Burt Knack if she feels her symptoms are not improving.    Follow-up plan: ICM clinic phone appointment on 10/30/2017.    Copy of ICM check sent to Dr. Lovena Le.   3 month ICM trend: 10/05/2017    1 Year ICM trend:       [name], RN 10/05/2017 1:11 PM

## 2017-10-05 NOTE — Progress Notes (Signed)
Veronica Shaw was seen today in the movement disorders clinic for neurologic consultation at the request of Ola Spurr Sharman Cheek, MD.  The patient has previously been following with Dr. Kyra Searles and her physician assistant, although she has not been seen at [facility] since March, 2015 and has not been seen by Dr. Hall Busing since October, 2014.  I have reviewed [facility] records.  She was first seen by [facility] in August, 2014 and was diagnosed with probable Parkinson's disease and was started on levodopa at that visit.  Her complaints at that time were pain all over, loss of balance, and right hand tremor.  When she followed up in October, 2014 she was only taking the levodopa twice a day, but UPDRS motor score had greatly improved.  She was told to increase the carbidopa/levodopa 25/100-1-1/2 tablets 3 times a day, but when she followed up in March, 2015 she did not think that that made a significant difference.  She was told to continue the medication.  As above, she has not been seen back by the neurology clinic at [facility] since that time.  She is off of carbidopa/levodopa as she doesn't think that she has parkinsons disease.  She states that she has been off of the medication for the last 2 years.  She does state that she often goes to the right side and will lose balance    12/22/15 update:  The patient follows up today regarding Parkinson's disease.  She was started on pramipexole 0.5 mg and supposed to work up to tid.  She admits that she didn't take it.  Her sister told her not to.  She states that she "told me before that she tried it" but I reminded her that she tried levodopa but only took it bid and then she d/c it.  She was worried about dyskinesia.  She states that she went to a prayer meeting and thinks that she may be cured.  She admits that she occasionally uses a cane because she goes to the right.    She denies any compulsive behaviors.  Denies sleep attacks.  No falls.  No hallucinations.   No lightheadedness or near syncope.  I did review records since last visit.  She underwent a right heart cath on 12/16/2015.  There was low filling pressures, but normal cardiac output.  Dr. Burt Knack suspected her shortness of breath secondary to asthma.  She states that has limited her exercise activities.    07/01/16 update:  The patient follows up today regarding Parkinson's disease.  She refuses any medication for her disease.  Reports that she has had a few falls but "I have not had any broken bones."  She reports that she takes coconut oil now as she heard it was good for her on the news.  No hallucinations, lightheadedness or near syncope.  I have reviewed her records since our last visit.  No new cardiac issues, although she continues to follow with cardiology.  She does have an ICD.  She is having some headaches in the temporal region 2 times per week and last one hour.  It feels throbbing.  Bilateral temporal.  Go away on own without med.   No longer taking neurontin.  She is no longer going to gym but is exercising some at home.  Gym exercise made her asthma worse.  She is now raising her 1 year old grandson.    12/30/16 update:  Patient seen today in follow-up for her Parkinson's disease.  She has refused medicine for quite some time for her Parkinson's disease.  Pt denies falls.  Having some near falls.  Pt having some dizziness but told that she had fluid in her ear and has appt next week with ENT.   "I know its my ear because I have ear pain." She has had no near syncope.  No hallucinations.  Mood has been good.   Going to the gym and "I do some granny exercises but they work [place] out."  C/o R shoulder pain - states that her sciatica has moved to her shoulder.  10/09/17 update: Patient is seen today in follow-up for Parkinson's disease.  I have not seen her in 9 months.  She has previously refused all medication.  Records have been reviewed since last visit.  She brings a long list of sx's consisting of  leg cramping, confusion, constipation, tripping, and a "dopey feeling in my head."  Takes yellow mustard for cramping.  She has continued to follow-up with cardiology for her congestive heart failure.  She is having more back pain.  She saw neurosx in the past but didn't have insurance then.  She is not on a schedule.  She is sometimes sleeping all day and sometimes having trouble with sleep at night.  Using melatonin.     PREVIOUS MEDICATIONS: Sinemet (pt was only taking bid and didn't think helped); mirapex (given but didn't take)  ALLERGIES:   Allergies  Allergen Reactions  . Aspirin Swelling and Other (See Comments)    Other reaction(s): Other (See Comments) Causes nose bleeds *ONLY THE COATED ASA* Causes nose bleeds *ONLY THE COATED ASA*  . Penicillins Shortness Of Breath and Rash    Shortness of Breath - Throat felt like it was closing.   [name] [Conj Estrog-Medroxyprogest Ace] Shortness Of Breath    Throat swelling Throat swelling  . Ace Inhibitors Cough  . Simvastatin Other (See Comments) and Rash    Muscle aches    CURRENT MEDICATIONS:  Outpatient Encounter Medications as of 10/09/2017  Medication Sig  . albuterol (PROVENTIL HFA;VENTOLIN HFA) 108 (90 Base) MCG/ACT inhaler Inhale 2 puffs into the lungs every 4 (four) hours as needed for wheezing or shortness of breath.  [name] Kitchen albuterol (PROVENTIL) (2.5 MG/3ML) 0.083% nebulizer solution Take 3 mLs (2.5 mg total) by nebulization every 6 (six) hours as needed for wheezing or shortness of breath.  . budesonide-formoterol (SYMBICORT) 160-4.5 MCG/ACT inhaler Inhale 2 puffs into the lungs 2 (two) times daily.  . Calcium Carb-Cholecalciferol 600-800 MG-UNIT TABS Take 1 tablet by mouth daily.  . furosemide (LASIX) 40 MG tablet Take 0.5 tablets (20 mg total) by mouth daily as needed for fluid (For swelling).  . metFORMIN (GLUCOPHAGE) 500 MG tablet TAKE 2 TABLETS TWICE DAILY WITH A MEAL  . metoprolol succinate (TOPROL-XL) 50 MG 24 hr  tablet TAKE 1 AND 1/2 TABLETS DAILY FOR 1 WEEK, THEN INCREASE TO 2 TABLETS DAILY AS DIRECTED (DOSE CHANGE) (Patient taking differently: 2 TABLETS DAILY AS DIRECTED (DOSE CHANGE))  . potassium chloride (K-DUR,KLOR-CON) 10 MEQ tablet TAKE 1 TABLET DAILY AS NEEDED (TAKE WITH LASIX AS NEEDED FOR SWELLING).  [name] Kitchen ranitidine (ZANTAC) 150 MG tablet Take 1 tablet (150 mg total) by mouth 2 (two) times daily.  [name] Kitchen losartan (COZAAR) 25 MG tablet Take 1 tablet (25 mg total) by mouth daily.  [name] Kitchen spironolactone (ALDACTONE) 25 MG tablet Take 0.5 tablets (12.5 mg total) by mouth daily.  . [DISCONTINUED] benzonatate (TESSALON) 100 MG capsule Take 1 capsule (  100 mg total) by mouth every 8 (eight) hours.  . [DISCONTINUED] chlorpheniramine-HYDROcodone (TUSSIONEX PENNKINETIC ER) 10-8 MG/5ML SUER Take 5 mLs by mouth every 12 (twelve) hours as needed for cough.  . [DISCONTINUED] cyclobenzaprine (FLEXERIL) 5 MG tablet Take 1 tablet (5 mg total) by mouth 3 (three) times daily as needed for muscle spasms.  . [DISCONTINUED] fluticasone (FLONASE) 50 MCG/ACT nasal spray Place 2 sprays into both nostrils daily.  . [DISCONTINUED] pantoprazole (PROTONIX) 40 MG tablet    No facility-administered encounter medications on file as of 10/09/2017.     PAST MEDICAL HISTORY:   Past Medical History:  Diagnosis Date  . Asthma   . Chronic systolic CHF (congestive heart failure) ([place])    a. cMRI 4/15: EF 34% and findings - c/w NICM, normal RV size and function (RVEF 61%), Mild MR // b. Echo 2/15:  EF 30-35%, diff HK, ant-sept AK, Gr 2 DD, mild MR, trivial TR  //  c. Echo 5/17: EF 20-25%, severe diffuse HK, marked systolic dyssynchrony, grade 1 diastolic dysfunction, mild MR  //  d. RHC 5/17: Fick CO 2.9, RVSP 19, PASP 15, PW mean 2, low filing pressures and preserved CO   . Diabetes mellitus   . Gastritis   . History of echocardiogram    Echo 6/18: EF 30-35, diffuse HK, grade 1 diastolic dysfunction, trivial MR, mild LAE, mild TR, no  pericardial effusion  . History of nuclear stress test    Myoview 5/18: EF 49, no ischemia, inferoseptal defect c/w LBBB artifact (intermediate risk due to EF < 50).  [name] Kitchen HTN (hypertension)   . Hyperlipidemia   . NICM (nonischemic cardiomyopathy) ([place])    a. Nuclear 5/13: Normal stress nuclear study. LV Ejection Fraction: 58%  //  b. LHC 10/14: Minor luminal irregularity in prox LAD, EF 35%   . Parkinson disease ([place])   . Plantar fasciitis   . Sleep apnea    was retested and no longer had it and so d/c CPAP  . Urticaria     PAST SURGICAL HISTORY:   Past Surgical History:  Procedure Laterality Date  . BUNIONECTOMY    . CARDIAC CATHETERIZATION N/A 12/16/2015   Procedure: Right Heart Cath;  Surgeon: Sherren Mocha, MD;  Location: [facility];  Service: Cardiovascular;  Laterality: N/A;  . CHOLECYSTECTOMY    . IMPLANTABLE CARDIOVERTER DEFIBRILLATOR IMPLANT  11-25-13   MDT dual chamber ICD implanted by [name] for primary prevention  . IMPLANTABLE CARDIOVERTER DEFIBRILLATOR IMPLANT N/A 11/25/2013   Procedure: IMPLANTABLE CARDIOVERTER DEFIBRILLATOR IMPLANT;  Surgeon: [name], MD;  Location: [facility] CATH LAB;  Service: Cardiovascular;  Laterality: N/A;  . LEFT AND RIGHT HEART CATHETERIZATION WITH CORONARY ANGIOGRAM N/A 05/31/2013   Procedure: LEFT AND RIGHT HEART CATHETERIZATION WITH CORONARY ANGIOGRAM;  Surgeon: [name], MD;  Location: [facility] CATH LAB;  Service: Cardiovascular;  Laterality: N/A;  . TONSILLECTOMY    . TUBAL LIGATION      SOCIAL HISTORY:   Social History   Socioeconomic History  . Marital status: Married    Spouse name: Not on file  . Number of children: 2  . Years of education: Not on file  . Highest education level: Not on file  Social Needs  . Financial resource strain: Not on file  . Food insecurity - worry: Not on file  . Food insecurity - inability: Not on file  . Transportation needs - medical: Not on file  . Transportation needs - non-medical:  Not on file  Occupational History  . Occupation: retired    Comment: CNA  Tobacco Use  . Smoking status: Never Smoker  . Smokeless tobacco: Never Used  . Tobacco comment: exposed to smoke during child hood (parents)  Substance and Sexual Activity  . Alcohol use: No    Alcohol/week: 0.0 oz  . Drug use: No  . Sexual activity: Yes  Other Topics Concern  . Not on file  Social History Narrative   Lives at home with husband and the dog.    FAMILY HISTORY:   Family Status  Relation Name Status  . Father  Deceased       MI, heart disease  . Mother  Deceased       MI, stroke, DM  . MGM  Deceased  . MGF  Deceased  . PGM  Deceased  . PGF  Deceased  . Sister  Alive        heart disease x3  . Son  Alive       DM  . Daughter  Alive       healthy  . Sister  Alive  . Sister  Alive  . Neg Hx  (Not Specified)    ROS:   Has chronic SOB with asthma and GERD.   A complete 10 system review of systems was obtained and was unremarkable apart from what is mentioned above.  PHYSICAL EXAMINATION:    VITALS:   Vitals:   10/09/17 1419  BP: 100/60  Pulse: 88  SpO2: 95%  Weight: 134 lb (60.8 kg)  Height: _0  (1.575 m)    GEN:  The patient appears stated age and is in NAD. HEENT:  Normocephalic, atraumatic.  The mucous membranes are moist. The superficial temporal arteries are without ropiness or tenderness. CV:  RRR Lungs:  CTAB Neck/HEME:  There are no carotid bruits bilaterally.  Neurological examination:  Orientation: alert and oriented x 3.  Montreal Cognitive Assessment  10/19/2015  Visuospatial/ Executive (0/5) 4  Naming (0/3) 3  Attention: Read list of digits (0/2) 2  Attention: Read list of letters (0/1) 1  Attention: Serial 7 subtraction starting at 100 (0/3) 0  Language: Repeat phrase (0/2) 2  Language : Fluency (0/1) 0  Abstraction (0/2) 2  Delayed Recall (0/5) 0  Orientation (0/6) 6  Total 20  Adjusted Score (based on education) 21   Cranial nerves: There  is good facial symmetry.  There is facial hypomimia.  The visual fields are full to confrontational testing. The speech is fluent and clear. Soft palate rises symmetrically and there is no tongue deviation. Hearing is intact to conversational tone. Sensation: Sensation is intact to light touch throughout Motor: Strength is 5/5 in the bilateral upper and lower extremities.   Shoulder shrug is equal and symmetric.  There is no pronator drift.   Movement examination: Tone: There is mildly increased tone in the bilateral UE Abnormal movements: There is a rare tremor in the RUE Coordination:  There is slight swelling of finger taps bilaterally, left more than right. Gait and Station: The patient pushes off of the chair.  She uses her cane.  She is slow.  Slightly drags the L leg.  ASSESSMENT/PLAN:  1.  Idiopathic Parkinson's disease.  The patient has tremor, bradykinesia, rigidity and mild postural instability.  Dx was made in 03/2013 at [facility].    -previously given mirapex but patient d/c it.  talked to her again about this especially given the fact that she has had several falls  but she refuses.  Understands consequences of this decision.    -has been on levodopa in past when at [facility] but only took bid and didn't think helpful and d/c.  Is off now.   She is ready to take it again.  She reports that she was in denial.  She is ready to retry.  Will start carbidopa/levodopa 25/100 and work to tid.  Risks, benefits, side effects and alternative therapies were discussed.  The opportunity to ask questions was given and they were answered to the best of my ability.  The patient expressed understanding and willingness to follow the outlined treatment protocols.  -talked about importance of exercising.  Talked to her about rock steady boxing, but the program and aspirin as well as here.  Discussed scholarship programs available to her.  -talked to her about regular daily schedule.  A schedule was printed out  for her and discussed how to fill this in.  I think she is getting some day night reversal, causing insomnia.  -talked to her about LSVT loud since she is having voice issues.  She will think about it and let me know if she would like to go..    -She met with our Parkinson's social worker today, who set an alarm on her phone to remind her to take her medication. 2.  Lumbar spinal stenosis  -Having more problems with back pain.  Told her to follow-up with her neurosurgeon. 3.  R shoulder pain  -told me today that she thought that her sciatica also travelled up her arm.  I told her that this wasn't possible and told her it may be her rotator cuff.  She doesn't want PT or ortho referral right now. 4.  Follow up is anticipated in the next few months, sooner should new neurologic issues arise.  Much greater than 50% of this visit was spent in counseling and coordinating care.  Total face to face time:  30 min

## 2017-10-09 ENCOUNTER — Encounter: Payer: Self-pay | Admitting: Psychology

## 2017-10-09 ENCOUNTER — Ambulatory Visit (INDEPENDENT_AMBULATORY_CARE_PROVIDER_SITE_OTHER): Payer: Medicare Other | Admitting: Neurology

## 2017-10-09 ENCOUNTER — Encounter: Payer: Self-pay | Admitting: Neurology

## 2017-10-09 VITALS — BP 100/60 | HR 88 | Ht 62.0 in | Wt 134.0 lb

## 2017-10-09 DIAGNOSIS — G20 Parkinson's disease: Secondary | ICD-10-CM

## 2017-10-09 DIAGNOSIS — M48061 Spinal stenosis, lumbar region without neurogenic claudication: Secondary | ICD-10-CM | POA: Diagnosis not present

## 2017-10-09 DIAGNOSIS — G20A1 Parkinson's disease without dyskinesia, without mention of fluctuations: Secondary | ICD-10-CM

## 2017-10-09 MED ORDER — CARBIDOPA-LEVODOPA 25-100 MG PO TABS: 1.0000 | ORAL_TABLET | Freq: Three times a day (TID) | ORAL | 1 refills | Status: DC

## 2017-10-09 NOTE — Patient Instructions (Addendum)
Start carbidopa/levodopa 25/100, 1/2 tab three times a day at least 30 min before meals x 1 wk, then 1/2 in am & 30 min before lunch and 30 min before dinner for a week, then 1/2 in am &1 30 minutes before lunch &one tablet thirty minutes before dinner for a week, then 1 tablet three times a day 30 minutes before meals.  As a reminder, carbidopa/levodopa can be taken at the same time as a carbohydrate, but we like to have you take your pill either 30 minutes before a protein source or 1 hour after as protein can interfere with carbidopa/levodopa absorption.

## 2017-10-09 NOTE — Progress Notes (Signed)
I met with the patient while she was in the clinic today.  We talked a little bit about strategies with medication compliance as the patient is planning on starting  to take her Parkinson's medications since she has not been doing well.  We talked about strategies for taking her medication and I helped her set up her smart phone with an alarm at 9:00 in the morning when she wakes up, 1 at 12:45 PM and the last one at 5:15 PM.  We talked about planning her week out and finding ways to be more active.  I suggested if she was sitting down to watch TV where she often falls asleep for hours that she immediately set a alarm on her phone for 1 hour to help her time her naps better.  The goal is for her not to nap during the daytime and to have more activity and movement in her day.  I gave her a planning sheet to help her schedule out what her ideal week with look like during the day.  We talked about incorporating rock steady boxing exercises classes approximately 3 times a week.  She would be able to receive a scholarship and I can help her with that process. I talked a little bit about the senior resource center as that may be an avenue for her to find activities that are enjoyable.    We worked a little bit on cognitive reframing as this may be helpful to offer her to help her be a little bit more positive which may help her outlook and participation.I will follow-up with her tomorrow with more specific information on the [place] rock steady boxing classes.  In addition, I will give her  information on the closest senior center to where she lives.  I am more than happy to help as long as she is willing to participate. She provided me with the following e-mail: [email or web address] to send her more information and resources.   The plan is for her to come back in 3 months and at that time we will assess if the scheduling and exercise has been helpful in addition to the medication compliance.

## 2017-10-15 ENCOUNTER — Ambulatory Visit (INDEPENDENT_AMBULATORY_CARE_PROVIDER_SITE_OTHER): Payer: Medicare Other

## 2017-10-15 ENCOUNTER — Encounter (HOSPITAL_COMMUNITY): Payer: Self-pay | Admitting: Emergency Medicine

## 2017-10-15 ENCOUNTER — Other Ambulatory Visit: Payer: Self-pay

## 2017-10-15 ENCOUNTER — Ambulatory Visit (HOSPITAL_COMMUNITY)
Admission: EM | Admit: 2017-10-15 | Discharge: 2017-10-15 | Disposition: A | Discharge status: Home / Self Care | Payer: Medicare Other

## 2017-10-15 ENCOUNTER — Emergency Department (HOSPITAL_COMMUNITY)
Admission: EM | Admit: 2017-10-15 | Discharge: 2017-10-15 | Disposition: A | Discharge status: Home / Self Care | Payer: Medicare Other | Source: Home / Self Care | Attending: Emergency Medicine | Admitting: Emergency Medicine

## 2017-10-15 DIAGNOSIS — J101 Influenza due to other identified influenza virus with other respiratory manifestations: Secondary | ICD-10-CM | POA: Insufficient documentation

## 2017-10-15 DIAGNOSIS — Z79899 Other long term (current) drug therapy: Secondary | ICD-10-CM

## 2017-10-15 DIAGNOSIS — R062 Wheezing: Secondary | ICD-10-CM

## 2017-10-15 DIAGNOSIS — I130 Hypertensive heart and chronic kidney disease with heart failure and stage 1 through stage 4 chronic kidney disease, or unspecified chronic kidney disease: Secondary | ICD-10-CM | POA: Diagnosis not present

## 2017-10-15 DIAGNOSIS — R059 Cough, unspecified: Secondary | ICD-10-CM

## 2017-10-15 DIAGNOSIS — R509 Fever, unspecified: Secondary | ICD-10-CM

## 2017-10-15 DIAGNOSIS — E119 Type 2 diabetes mellitus without complications: Secondary | ICD-10-CM

## 2017-10-15 DIAGNOSIS — I5022 Chronic systolic (congestive) heart failure: Secondary | ICD-10-CM | POA: Diagnosis not present

## 2017-10-15 DIAGNOSIS — N179 Acute kidney failure, unspecified: Secondary | ICD-10-CM | POA: Diagnosis not present

## 2017-10-15 DIAGNOSIS — R05 Cough: Secondary | ICD-10-CM

## 2017-10-15 DIAGNOSIS — I428 Other cardiomyopathies: Secondary | ICD-10-CM | POA: Diagnosis not present

## 2017-10-15 DIAGNOSIS — I509 Heart failure, unspecified: Secondary | ICD-10-CM

## 2017-10-15 DIAGNOSIS — J4531 Mild persistent asthma with (acute) exacerbation: Secondary | ICD-10-CM | POA: Diagnosis not present

## 2017-10-15 DIAGNOSIS — R52 Pain, unspecified: Secondary | ICD-10-CM

## 2017-10-15 DIAGNOSIS — R079 Chest pain, unspecified: Secondary | ICD-10-CM

## 2017-10-15 DIAGNOSIS — R51 Headache: Secondary | ICD-10-CM | POA: Diagnosis not present

## 2017-10-15 DIAGNOSIS — J111 Influenza due to unidentified influenza virus with other respiratory manifestations: Secondary | ICD-10-CM | POA: Diagnosis not present

## 2017-10-15 DIAGNOSIS — E860 Dehydration: Secondary | ICD-10-CM | POA: Diagnosis not present

## 2017-10-15 DIAGNOSIS — Z7984 Long term (current) use of oral hypoglycemic drugs: Secondary | ICD-10-CM

## 2017-10-15 DIAGNOSIS — I110 Hypertensive heart disease with heart failure: Secondary | ICD-10-CM

## 2017-10-15 DIAGNOSIS — S0990XA Unspecified injury of head, initial encounter: Secondary | ICD-10-CM | POA: Diagnosis not present

## 2017-10-15 DIAGNOSIS — R55 Syncope and collapse: Secondary | ICD-10-CM | POA: Diagnosis not present

## 2017-10-15 DIAGNOSIS — J4530 Mild persistent asthma, uncomplicated: Secondary | ICD-10-CM

## 2017-10-15 DIAGNOSIS — Z95810 Presence of automatic (implantable) cardiac defibrillator: Secondary | ICD-10-CM

## 2017-10-15 DIAGNOSIS — R000 Tachycardia, unspecified: Secondary | ICD-10-CM | POA: Diagnosis not present

## 2017-10-15 DIAGNOSIS — R0602 Shortness of breath: Secondary | ICD-10-CM | POA: Diagnosis not present

## 2017-10-15 LAB — CBC
HCT: 37.6 % (ref 36.0–46.0)
Hemoglobin: 11.9 g/dL — ABNORMAL LOW (ref 12.0–15.0)
MCH: 29 pg (ref 26.0–34.0)
MCHC: 31.6 g/dL (ref 30.0–36.0)
MCV: 91.7 fL (ref 78.0–100.0)
Platelets: 166 10*3/uL (ref 150–400)
RBC: 4.1 MIL/uL (ref 3.87–5.11)
RDW: 13.1 % (ref 11.5–15.5)
WBC: 3.7 10*3/uL — ABNORMAL LOW (ref 4.0–10.5)

## 2017-10-15 LAB — INFLUENZA PANEL BY PCR (TYPE A & B)
Influenza A By PCR: POSITIVE — AB
Influenza B By PCR: NEGATIVE

## 2017-10-15 LAB — BASIC METABOLIC PANEL
Anion gap: 12 (ref 5–15)
BUN: 24 mg/dL — ABNORMAL HIGH (ref 6–20)
CO2: 21 mmol/L — ABNORMAL LOW (ref 22–32)
Calcium: 9 mg/dL (ref 8.9–10.3)
Chloride: 100 mmol/L — ABNORMAL LOW (ref 101–111)
Creatinine, Ser: 1.61 mg/dL — ABNORMAL HIGH (ref 0.44–1.00)
GFR calc Af Amer: 37 mL/min — ABNORMAL LOW (ref >60)
GFR calc non Af Amer: 32 mL/min — ABNORMAL LOW (ref >60)
Glucose, Bld: 131 mg/dL — ABNORMAL HIGH (ref 65–99)
Potassium: 4 mmol/L (ref 3.5–5.1)
Sodium: 133 mmol/L — ABNORMAL LOW (ref 135–145)

## 2017-10-15 LAB — BRAIN NATRIURETIC PEPTIDE: B Natriuretic Peptide: 26.9 pg/mL (ref 0.0–100.0)

## 2017-10-15 LAB — I-STAT TROPONIN, ED: Troponin i, poc: 0 ng/mL (ref 0.00–0.08)

## 2017-10-15 MED ORDER — IPRATROPIUM-ALBUTEROL 0.5-2.5 (3) MG/3ML IN SOLN: 3.0000 mL | Freq: Once | RESPIRATORY_TRACT | Status: DC

## 2017-10-15 MED ORDER — ACETAMINOPHEN 325 MG PO TABS
650.0000 mg | ORAL_TABLET | Freq: Once | ORAL | Status: AC
  Administered 2017-10-15: 650 mg via ORAL

## 2017-10-15 MED ORDER — HYDROCODONE-HOMATROPINE 5-1.5 MG/5ML PO SYRP: 5.0000 mL | ORAL_SOLUTION | Freq: Four times a day (QID) | ORAL | 0 refills | Status: DC | PRN

## 2017-10-15 MED ORDER — ACETAMINOPHEN 325 MG PO TABS
ORAL_TABLET | ORAL | Status: AC
  Filled 2017-10-15: qty 2

## 2017-10-15 MED ORDER — IPRATROPIUM-ALBUTEROL 0.5-2.5 (3) MG/3ML IN SOLN
RESPIRATORY_TRACT | Status: AC
  Filled 2017-10-15: qty 3

## 2017-10-15 MED ORDER — HYDROCOD POLST-CPM POLST ER 10-8 MG/5ML PO SUER
5.0000 mL | Freq: Once | ORAL | Status: AC
  Administered 2017-10-15: 5 mL via ORAL
  Filled 2017-10-15: qty 5

## 2017-10-15 NOTE — ED Notes (Signed)
Pt called for triage x2. No response.  

## 2017-10-15 NOTE — ED Provider Notes (Signed)
  MRN: [id number] DOB: 11-Oct-1950  Subjective:   Veronica Shaw is a 67 y.o. female presenting for 4 day history of productive cough, chest pain, shob, wheezing, fever, nasal congestion, headaches, body aches, sore throat. Has a history of asthma, managed with Symbicort, albuterol. Has been using her albuterol inhaler daily with nebulizer. She has a chf with EF of 30-35% 12/2016.   Veronica Shaw is allergic to aspirin; penicillins; prempro [conj estrog-medroxyprogest ace]; ace inhibitors; and simvastatin.  Veronica Shaw  has a past medical history of Asthma, Chronic systolic CHF (congestive heart failure) ([place]), Diabetes mellitus, Gastritis, History of echocardiogram, History of nuclear stress test, HTN (hypertension), Hyperlipidemia, NICM (nonischemic cardiomyopathy) ([place]), Parkinson disease ([place]), Plantar fasciitis, Sleep apnea, and Urticaria. Also  has a past surgical history that includes Tubal ligation; Cholecystectomy; Bunionectomy; Implantable cardioverter defibrillator implant (11-25-13); left and right heart catheterization with coronary angiogram (N/A, 05/31/2013); implantable cardioverter defibrillator implant (N/A, 11/25/2013); Tonsillectomy; and Cardiac catheterization (N/A, 12/16/2015).  Objective:   Vitals: BP (!) 138/97   Pulse (!) 114   Temp (!) 100.4 F (38 C)   Resp 20   LMP 08/01/2000 (Approximate)   SpO2 100%   Physical Exam  Constitutional: She is oriented to person, place, and time. She appears well-developed and well-nourished.  HENT:  Right Ear: Tympanic membrane normal.  Left Ear: Tympanic membrane normal.  Nose: No sinus tenderness.  Post-nasal drainage noted over oropharynx.  Eyes: Right eye exhibits no discharge. Left eye exhibits no discharge.  Cardiovascular: Normal rate, regular rhythm and intact distal pulses. Exam reveals no gallop and no friction rub.  No murmur heard. Pulmonary/Chest: No respiratory distress. She has no wheezes. She has no rales.  Abdominal: Soft. Bowel  sounds are normal. She exhibits no distension and no mass. There is tenderness (upper abdomen). There is no rebound and no guarding.  Neurological: She is alert and oriented to person, place, and time.  Skin: Skin is warm and dry.  Psychiatric: She has a normal mood and affect.   Dg Chest 2 View  Result Date: 10/15/2017 CLINICAL DATA:  Headache.  Productive cough. EXAM: CHEST - 2 VIEW COMPARISON:  Chest radiograph 04/07/2017. FINDINGS: Multi lead AICD device overlies the left hemithorax. Stable cardiac and mediastinal contours. No consolidative pulmonary opacities. No pleural effusion or pneumothorax. Thoracic spine degenerative changes. IMPRESSION: No acute cardiopulmonary process. Electronically Signed   By: Lovey Newcomer M.D.   On: 10/15/2017 15:15   Assessment and Plan :   Cough  Fever, unspecified  Body aches  Chest pain, unspecified type  Shortness of breath  Wheezing  Chronic congestive heart failure, unspecified heart failure type ([place])  Non-ischemic cardiomyopathy ([place])  Presence of cardiac defibrillator  Patient is very high risk. Her chest x-ray is reassuring but she may have influenza or other process that would be complicated by her CHF, NICM. Advised her to report to the ER for further work up. Patient is in agreement.   Jaynee Eagles, PA-C 10/15/17 1601

## 2017-10-15 NOTE — ED Triage Notes (Signed)
Pt c/o headache, coughing, chest congestion, back of her neck is hurting, fevers, for several days.

## 2017-10-15 NOTE — Discharge Instructions (Signed)
Please go to the ER immediately for evaluation of your chest pain, fever, cough in setting of CHF, NICM. I suspect you may have the flu but you are a high risk patient that needs more aggressive management.

## 2017-10-15 NOTE — ED Triage Notes (Addendum)
Pt seen at [facility] and sent to ED.  C/o fever (up to 102.8), headache, productive cough with yellow phlegm, chest congestion, and posterior neck pain since Thursday.  Reports wheezing and pain in center of chest when coughing.  CXR completed at [facility].

## 2017-10-15 NOTE — Discharge Instructions (Signed)
Take Tylenol or Motrin for fever and body aches.  Cough medication as prescribed.  Use your inhaler or nebulizer every 4 hours.  Drink plenty of fluids.  Rest.  Follow-up with family doctor if not improving in 2-3 days.  Return if worsening symptoms.

## 2017-10-15 NOTE — ED Provider Notes (Signed)
[facility] EMERGENCY DEPARTMENT Provider Note   CSN: [id number] Arrival date & time: 10/15/17  1511     History   Chief Complaint Chief Complaint  Patient presents with  . Fever  . Sore Throat    HPI Veronica Shaw is a 67 y.o. female.  HPI  Veronica Shaw is a 67 y.o. female with history of CHF, diabetes, hypertension, Parkinson's disease, presents to emergency department with complaint of flulike symptoms.  Patient reports 4-day history of productive cough, shortness of breath, wheezing, fever up to 102, nasal congestion, headache, body aches, sore throat.  She has been using her albuterol inhaler daily with a nebulizer with no relief.  She denies any recent sick contacts.  She does report associated shortness of breath.  No nausea, vomiting, diarrhea.  She went to urgent care with her symptoms and was sent here for further evaluation given her past medical history and abnormal vital signs.  Chest x-ray was obtained there and is negative.   Past Medical History:  Diagnosis Date  . Asthma   . Chronic systolic CHF (congestive heart failure) ([place])    a. cMRI 4/15: EF 34% and findings - c/w NICM, normal RV size and function (RVEF 61%), Mild MR // b. Echo 2/15:  EF 30-35%, diff HK, ant-sept AK, Gr 2 DD, mild MR, trivial TR  //  c. Echo 5/17: EF 20-25%, severe diffuse HK, marked systolic dyssynchrony, grade 1 diastolic dysfunction, mild MR  //  d. RHC 5/17: Fick CO 2.9, RVSP 19, PASP 15, PW mean 2, low filing pressures and preserved CO   . Diabetes mellitus   . Gastritis   . History of echocardiogram    Echo 6/18: EF 30-35, diffuse HK, grade 1 diastolic dysfunction, trivial MR, mild LAE, mild TR, no pericardial effusion  . History of nuclear stress test    Myoview 5/18: EF 49, no ischemia, inferoseptal defect c/w LBBB artifact (intermediate risk due to EF < 50).  [name] Kitchen HTN (hypertension)   . Hyperlipidemia   . NICM (nonischemic cardiomyopathy) ([place])    a. Nuclear 5/13:  Normal stress nuclear study. LV Ejection Fraction: 58%  //  b. LHC 10/14: Minor luminal irregularity in prox LAD, EF 35%   . Parkinson disease ([place])   . Plantar fasciitis   . Sleep apnea    was retested and no longer had it and so d/c CPAP  . Urticaria     Patient Active Problem List   Diagnosis Date Noted  . Asthma exacerbation 08/03/2017  . Dense breast tissue on mammogram 05/30/2017  . Hair loss 07/21/2016  . Middle ear effusion, bilateral 04/28/2016  . Suprapubic pain 04/14/2016  . Allergic rhinitis 11/09/2015  . Upper airway cough syndrome 08/20/2015  . Osteopenia 07/10/2015  . Mild persistent asthma in adult without complication 78/93/8101  . Bunion, left foot 05/20/2014  . NICM (nonischemic cardiomyopathy) ([place]) 04/23/2014  . Arthritis or polyarthritis, rheumatoid ([place]) 03/12/2014  . ICD (implantable cardioverter-defibrillator), dual, in situ 03/06/2014  . Thoracic or lumbosacral neuritis or radiculitis, unspecified 07/03/2013  . Chronic systolic heart failure ([place]) 05/31/2013  . Parkinson disease ([place]) 05/21/2013  . 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 Major depression 03/31/2013  . Balance problem 03/03/2013  . Lower extremity edema 03/03/2013  . GERD (gastroesophageal reflux disease) 09/27/2012  . Blurry vision, bilateral 09/27/2012  . Dyspnea 09/19/2012  . Plantar fasciitis, bilateral 06/15/2012  . Cervical pain (neck) 04/12/2012  . 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  Diabetic peripheral neuropathy ([place]) 12/09/2011  . Hyperlipidemia 12/09/2011  . Sleep apnea 12/09/2011  . Fatigue 10/13/2011  . Chronic urticaria 05/27/2011  . 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 05/20/2011  . Sciatic leg pain 07/22/2009  . ROSACEA 05/29/2009  . ACHILLES BURSITIS OR TENDINITIS 03/27/2009  . Type II diabetes mellitus with complication ([place]) 96/29/5284  . Essential hypertension 09/28/2006    Past Surgical History:  Procedure Laterality Date  . BUNIONECTOMY    . CARDIAC  CATHETERIZATION N/A 12/16/2015   Procedure: Right Heart Cath;  Surgeon: Sherren Mocha, MD;  Location: [facility] CV LAB;  Service: Cardiovascular;  Laterality: N/A;  . CHOLECYSTECTOMY    . IMPLANTABLE CARDIOVERTER DEFIBRILLATOR IMPLANT  11-25-13   MDT dual chamber ICD implanted by [name] for primary prevention  . IMPLANTABLE CARDIOVERTER DEFIBRILLATOR IMPLANT N/A 11/25/2013   Procedure: IMPLANTABLE CARDIOVERTER DEFIBRILLATOR IMPLANT;  Surgeon: [name], MD;  Location: [facility] CATH LAB;  Service: Cardiovascular;  Laterality: N/A;  . LEFT AND RIGHT HEART CATHETERIZATION WITH CORONARY ANGIOGRAM N/A 05/31/2013   Procedure: LEFT AND RIGHT HEART CATHETERIZATION WITH CORONARY ANGIOGRAM;  Surgeon: [name], MD;  Location: [facility] CATH LAB;  Service: Cardiovascular;  Laterality: N/A;  . TONSILLECTOMY    . TUBAL LIGATION      OB History    No data available       Home Medications    Prior to Admission medications   Medication Sig Start Date End Date Taking? Authorizing Provider  albuterol (PROVENTIL HFA;VENTOLIN HFA) 108 (90 Base) MCG/ACT inhaler Inhale 2 puffs into the lungs every 4 (four) hours as needed for wheezing or shortness of breath. 05/30/17   Rogue Bussing, MD  albuterol (PROVENTIL) (2.5 MG/3ML) 0.083% nebulizer solution Take 3 mLs (2.5 mg total) by nebulization every 6 (six) hours as needed for wheezing or shortness of breath. 08/03/17   Deneise Lever, MD  budesonide-formoterol (SYMBICORT) 160-4.5 MCG/ACT inhaler Inhale 2 puffs into the lungs 2 (two) times daily. 05/30/17   Rogue Bussing, MD  Calcium Carb-Cholecalciferol 600-800 MG-UNIT TABS Take 1 tablet by mouth daily. 11/23/16   Rogue Bussing, MD  carbidopa-levodopa (SINEMET IR) 25-100 MG tablet Take 1 tablet by mouth 3 (three) times daily. 10/09/17   Tat, Eustace Quail, DO  furosemide (LASIX) 40 MG tablet Take 0.5 tablets (20 mg total) by mouth daily as needed for fluid (For swelling). 09/22/17    Rogue Bussing, MD  losartan (COZAAR) 25 MG tablet Take 1 tablet (25 mg total) by mouth daily. 02/17/17 05/18/17  Sherren Mocha, MD  metFORMIN (GLUCOPHAGE) 500 MG tablet TAKE 2 TABLETS TWICE DAILY WITH A MEAL 01/16/17   Rogue Bussing, MD  metoprolol succinate (TOPROL-XL) 50 MG 24 hr tablet TAKE 1 AND 1/2 TABLETS DAILY FOR 1 WEEK, THEN INCREASE TO 2 TABLETS DAILY AS DIRECTED (DOSE CHANGE) Patient taking differently: 2 TABLETS DAILY AS DIRECTED (DOSE CHANGE) 06/20/16   [name], MD  potassium chloride (K-DUR,KLOR-CON) 10 MEQ tablet TAKE 1 TABLET DAILY AS NEEDED (TAKE WITH LASIX AS NEEDED FOR SWELLING). 07/18/17   Rogue Bussing, MD  ranitidine (ZANTAC) 150 MG tablet Take 1 tablet (150 mg total) by mouth 2 (two) times daily. 07/28/17   Rogue Bussing, MD  spironolactone (ALDACTONE) 25 MG tablet Take 0.5 tablets (12.5 mg total) by mouth daily. 03/08/17 09/17/17  Daune Perch, NP  Sulfamethoxazole-Trimethoprim (BACTRIM PO) Take by mouth.    [provider]    Family History Family History  Problem Relation Age  of Onset  . Coronary artery disease Father        Died age 64  . Heart attack Father   . Diabetes Father   . Coronary artery disease Mother        Died age 24  . Heart attack Mother   . Diabetes Mother   . Stroke Mother   . Parkinson's disease Sister   . Heart disease Sister   . Hepatitis C Sister   . Diabetes Son 37       T1DM  . Diabetes Sister   . Heart disease Sister   . Diabetes Sister   . Heart disease Sister   . Breast cancer Neg Hx     Social History Social History   Tobacco Use  . Smoking status: Never Smoker  . Smokeless tobacco: Never Used  . Tobacco comment: exposed to smoke during child hood (parents)  Substance Use Topics  . Alcohol use: No    Alcohol/week: 0.0 oz  . Drug use: No     Allergies   Aspirin; Penicillins; Prempro [conj estrog-medroxyprogest ace]; Ace inhibitors; and  Simvastatin   Review of Systems Review of Systems  Constitutional: Positive for chills and fever.  HENT: Positive for congestion and sore throat.   Respiratory: Positive for cough, shortness of breath and wheezing. Negative for chest tightness.   Cardiovascular: Negative for chest pain, palpitations and leg swelling.  Gastrointestinal: Negative for abdominal pain, diarrhea, nausea and vomiting.  Genitourinary: Negative for dysuria, flank pain and pelvic pain.  Musculoskeletal: Positive for arthralgias and myalgias. Negative for neck pain and neck stiffness.  Skin: Negative for rash.  Neurological: Positive for headaches. Negative for dizziness and weakness.  All other systems reviewed and are negative.    Physical Exam Updated Vital Signs BP 122/63 (BP Location: Right Arm)   Pulse (!) 113   Temp 98.9 F (37.2 C) (Oral)   Resp 15   Ht 5\' 2"  (1.575 m)   Wt 60.8 kg (134 lb)   LMP 08/01/2000 (Approximate)   SpO2 98%   BMI 24.51 kg/m   Physical Exam  Constitutional: She is oriented to person, place, and time. She appears well-developed and well-nourished. No distress.  HENT:  Head: Normocephalic and atraumatic.  Right Ear: Tympanic membrane, external ear and ear canal normal.  Left Ear: Tympanic membrane, external ear and ear canal normal.  Nose: Mucosal edema and rhinorrhea present.  Mouth/Throat: Uvula is midline and mucous membranes are normal. Posterior oropharyngeal erythema present. No oropharyngeal exudate, posterior oropharyngeal edema or tonsillar abscesses.  Eyes: Conjunctivae are normal.  Neck: Neck supple.  Cardiovascular: Normal rate, regular rhythm, normal heart sounds and intact distal pulses.  Pulmonary/Chest: Effort normal and breath sounds normal. No respiratory distress. She has no wheezes. She has no rales.  Abdominal: Soft. Bowel sounds are normal. She exhibits no distension. There is no tenderness. There is no rebound.  Musculoskeletal: Normal range of  motion. She exhibits no edema.  Neurological: She is alert and oriented to person, place, and time.  Skin: Skin is warm and dry.  Psychiatric: She has a normal mood and affect. Her behavior is normal.  Nursing note and vitals reviewed.    ED Treatments / Results  Labs (all labs ordered are listed, but only abnormal results are displayed) Labs Reviewed  BASIC METABOLIC PANEL - Abnormal; Notable for the following components:      Result Value   Sodium 133 (*)    Chloride 100 (*)  CO2 21 (*)    Glucose, Bld 131 (*)    BUN 24 (*)    Creatinine, Ser 1.61 (*)    GFR calc non Af Amer 32 (*)    GFR calc Af Amer 37 (*)    All other components within normal limits  CBC - Abnormal; Notable for the following components:   WBC 3.7 (*)    Hemoglobin 11.9 (*)    All other components within normal limits  INFLUENZA PANEL BY PCR (TYPE A & B) - Abnormal; Notable for the following components:   Influenza A By PCR POSITIVE (*)    All other components within normal limits  BRAIN NATRIURETIC PEPTIDE  I-STAT TROPONIN, ED    EKG  EKG Interpretation None       Radiology Dg Chest 2 View  Result Date: 10/15/2017 CLINICAL DATA:  Headache.  Productive cough. EXAM: CHEST - 2 VIEW COMPARISON:  Chest radiograph 04/07/2017. FINDINGS: Multi lead AICD device overlies the left hemithorax. Stable cardiac and mediastinal contours. No consolidative pulmonary opacities. No pleural effusion or pneumothorax. Thoracic spine degenerative changes. IMPRESSION: No acute cardiopulmonary process. Electronically Signed   By: Lovey Newcomer M.D.   On: 10/15/2017 15:15    Procedures Procedures (including critical care time)  Medications Ordered in ED Medications - No data to display   Initial Impression / Assessment and Plan / ED Course  I have reviewed the triage vital signs and the nursing notes.  Pertinent labs & imaging results that were available during my care of the patient were reviewed by me and  considered in my medical decision making (see chart for details).     Pt with flu like symptoms for 4 days. Sent from [facility] for evaluation due to tachycardia and comobidities. CXR at urgetn care negative. Will get labs, influenza panel. Pt requestion something for cough. tussionex ordered. Tylenol given at [facility]  8:37 PM Influenza positive. Labs with slightly worsening renal function, question dehydration. Pt's VS improved. No longer tachycardic. VS all within normal. She would like to go home.  Plan to dc home. With close outpatient follow up. Return precautions discussed.   Vitals:   10/15/17 1743 10/15/17 1744 10/15/17 1900 10/15/17 1930  BP: 122/63  109/61 (!) 103/48  Pulse: (!) 113  99 95  Resp: 15  17 14   Temp: 98.9 F (37.2 C)     TempSrc: Oral     SpO2: 98%  94% 94%  Weight:  60.8 kg (134 lb)    Height:  5\' 2"  (1.575 m)       Final Clinical Impressions(s) / ED Diagnoses   Final diagnoses:  Influenza    ED Discharge Orders    None       Jeannett Senior, PA-C 10/15/17 2200    Elnora Morrison, MD 10/20/17 [phone]

## 2017-10-17 ENCOUNTER — Emergency Department (HOSPITAL_COMMUNITY): Payer: Medicare Other

## 2017-10-17 ENCOUNTER — Telehealth: Payer: Self-pay | Admitting: Neurology

## 2017-10-17 ENCOUNTER — Other Ambulatory Visit: Payer: Self-pay

## 2017-10-17 ENCOUNTER — Encounter (HOSPITAL_COMMUNITY): Payer: Self-pay

## 2017-10-17 ENCOUNTER — Inpatient Hospital Stay (HOSPITAL_COMMUNITY)
Admission: EM | Admit: 2017-10-17 | Discharge: 2017-10-19 | DRG: 641 | Disposition: A | Discharge status: Home / Self Care | Payer: Medicare Other | Attending: Family Medicine | Admitting: Family Medicine

## 2017-10-17 DIAGNOSIS — M069 Rheumatoid arthritis, unspecified: Secondary | ICD-10-CM | POA: Diagnosis present

## 2017-10-17 DIAGNOSIS — E669 Obesity, unspecified: Secondary | ICD-10-CM | POA: Diagnosis present

## 2017-10-17 DIAGNOSIS — I428 Other cardiomyopathies: Secondary | ICD-10-CM | POA: Diagnosis present

## 2017-10-17 DIAGNOSIS — R6884 Jaw pain: Secondary | ICD-10-CM | POA: Diagnosis not present

## 2017-10-17 DIAGNOSIS — Z95810 Presence of automatic (implantable) cardiac defibrillator: Secondary | ICD-10-CM

## 2017-10-17 DIAGNOSIS — R011 Cardiac murmur, unspecified: Secondary | ICD-10-CM | POA: Diagnosis present

## 2017-10-17 DIAGNOSIS — J4531 Mild persistent asthma with (acute) exacerbation: Secondary | ICD-10-CM | POA: Diagnosis present

## 2017-10-17 DIAGNOSIS — I5022 Chronic systolic (congestive) heart failure: Secondary | ICD-10-CM | POA: Diagnosis present

## 2017-10-17 DIAGNOSIS — R2689 Other abnormalities of gait and mobility: Secondary | ICD-10-CM

## 2017-10-17 DIAGNOSIS — J441 Chronic obstructive pulmonary disease with (acute) exacerbation: Secondary | ICD-10-CM | POA: Diagnosis not present

## 2017-10-17 DIAGNOSIS — Z833 Family history of diabetes mellitus: Secondary | ICD-10-CM

## 2017-10-17 DIAGNOSIS — E1122 Type 2 diabetes mellitus with diabetic chronic kidney disease: Secondary | ICD-10-CM | POA: Diagnosis present

## 2017-10-17 DIAGNOSIS — T428X6A Underdosing of antiparkinsonism drugs and other central muscle-tone depressants, initial encounter: Secondary | ICD-10-CM | POA: Diagnosis present

## 2017-10-17 DIAGNOSIS — N179 Acute kidney failure, unspecified: Secondary | ICD-10-CM | POA: Diagnosis present

## 2017-10-17 DIAGNOSIS — I130 Hypertensive heart and chronic kidney disease with heart failure and stage 1 through stage 4 chronic kidney disease, or unspecified chronic kidney disease: Secondary | ICD-10-CM | POA: Diagnosis present

## 2017-10-17 DIAGNOSIS — R296 Repeated falls: Secondary | ICD-10-CM | POA: Diagnosis present

## 2017-10-17 DIAGNOSIS — J45901 Unspecified asthma with (acute) exacerbation: Secondary | ICD-10-CM

## 2017-10-17 DIAGNOSIS — K219 Gastro-esophageal reflux disease without esophagitis: Secondary | ICD-10-CM | POA: Diagnosis present

## 2017-10-17 DIAGNOSIS — J111 Influenza due to unidentified influenza virus with other respiratory manifestations: Secondary | ICD-10-CM | POA: Diagnosis not present

## 2017-10-17 DIAGNOSIS — J101 Influenza due to other identified influenza virus with other respiratory manifestations: Secondary | ICD-10-CM | POA: Diagnosis present

## 2017-10-17 DIAGNOSIS — Z7984 Long term (current) use of oral hypoglycemic drugs: Secondary | ICD-10-CM

## 2017-10-17 DIAGNOSIS — I951 Orthostatic hypotension: Secondary | ICD-10-CM | POA: Diagnosis present

## 2017-10-17 DIAGNOSIS — N183 Chronic kidney disease, stage 3 (moderate): Secondary | ICD-10-CM | POA: Diagnosis present

## 2017-10-17 DIAGNOSIS — J309 Allergic rhinitis, unspecified: Secondary | ICD-10-CM | POA: Diagnosis present

## 2017-10-17 DIAGNOSIS — Z79899 Other long term (current) drug therapy: Secondary | ICD-10-CM

## 2017-10-17 DIAGNOSIS — R55 Syncope and collapse: Secondary | ICD-10-CM

## 2017-10-17 DIAGNOSIS — Z886 Allergy status to analgesic agent status: Secondary | ICD-10-CM

## 2017-10-17 DIAGNOSIS — R000 Tachycardia, unspecified: Secondary | ICD-10-CM | POA: Diagnosis not present

## 2017-10-17 DIAGNOSIS — E785 Hyperlipidemia, unspecified: Secondary | ICD-10-CM | POA: Diagnosis present

## 2017-10-17 DIAGNOSIS — Z91128 Patient's intentional underdosing of medication regimen for other reason: Secondary | ICD-10-CM

## 2017-10-17 DIAGNOSIS — Z9181 History of falling: Secondary | ICD-10-CM

## 2017-10-17 DIAGNOSIS — R0602 Shortness of breath: Secondary | ICD-10-CM | POA: Diagnosis not present

## 2017-10-17 DIAGNOSIS — I447 Left bundle-branch block, unspecified: Secondary | ICD-10-CM | POA: Diagnosis present

## 2017-10-17 DIAGNOSIS — Z820 Family history of epilepsy and other diseases of the nervous system: Secondary | ICD-10-CM

## 2017-10-17 DIAGNOSIS — G20 Parkinson's disease: Secondary | ICD-10-CM | POA: Diagnosis present

## 2017-10-17 DIAGNOSIS — Z6825 Body mass index (BMI) 25.0-25.9, adult: Secondary | ICD-10-CM | POA: Diagnosis not present

## 2017-10-17 DIAGNOSIS — I5023 Acute on chronic systolic (congestive) heart failure: Secondary | ICD-10-CM | POA: Diagnosis not present

## 2017-10-17 DIAGNOSIS — R51 Headache: Secondary | ICD-10-CM | POA: Diagnosis not present

## 2017-10-17 DIAGNOSIS — E1142 Type 2 diabetes mellitus with diabetic polyneuropathy: Secondary | ICD-10-CM | POA: Diagnosis present

## 2017-10-17 DIAGNOSIS — R4189 Other symptoms and signs involving cognitive functions and awareness: Secondary | ICD-10-CM | POA: Diagnosis present

## 2017-10-17 DIAGNOSIS — Z7722 Contact with and (suspected) exposure to environmental tobacco smoke (acute) (chronic): Secondary | ICD-10-CM | POA: Diagnosis present

## 2017-10-17 DIAGNOSIS — Z91018 Allergy to other foods: Secondary | ICD-10-CM

## 2017-10-17 DIAGNOSIS — R05 Cough: Secondary | ICD-10-CM | POA: Diagnosis not present

## 2017-10-17 DIAGNOSIS — Z823 Family history of stroke: Secondary | ICD-10-CM

## 2017-10-17 DIAGNOSIS — Z880 Allergy status to penicillin: Secondary | ICD-10-CM

## 2017-10-17 DIAGNOSIS — E860 Dehydration: Principal | ICD-10-CM | POA: Diagnosis present

## 2017-10-17 DIAGNOSIS — M8580 Other specified disorders of bone density and structure, unspecified site: Secondary | ICD-10-CM | POA: Diagnosis present

## 2017-10-17 DIAGNOSIS — Z9089 Acquired absence of other organs: Secondary | ICD-10-CM

## 2017-10-17 DIAGNOSIS — Z9851 Tubal ligation status: Secondary | ICD-10-CM

## 2017-10-17 DIAGNOSIS — Z7951 Long term (current) use of inhaled steroids: Secondary | ICD-10-CM

## 2017-10-17 DIAGNOSIS — Z9049 Acquired absence of other specified parts of digestive tract: Secondary | ICD-10-CM

## 2017-10-17 DIAGNOSIS — Z8249 Family history of ischemic heart disease and other diseases of the circulatory system: Secondary | ICD-10-CM

## 2017-10-17 DIAGNOSIS — S0990XA Unspecified injury of head, initial encounter: Secondary | ICD-10-CM | POA: Diagnosis not present

## 2017-10-17 DIAGNOSIS — Z888 Allergy status to other drugs, medicaments and biological substances status: Secondary | ICD-10-CM

## 2017-10-17 HISTORY — DX: Influenza due to unidentified influenza virus with other respiratory manifestations: J11.1

## 2017-10-17 LAB — CBC WITH DIFFERENTIAL/PLATELET
Basophils Absolute: 0 10*3/uL (ref 0.0–0.1)
Basophils Relative: 0 %
Eosinophils Absolute: 0.1 10*3/uL (ref 0.0–0.7)
Eosinophils Relative: 1 %
HCT: 37.2 % (ref 36.0–46.0)
Hemoglobin: 12.1 g/dL (ref 12.0–15.0)
Lymphocytes Relative: 23 %
Lymphs Abs: 1.1 10*3/uL (ref 0.7–4.0)
MCH: 29.6 pg (ref 26.0–34.0)
MCHC: 32.5 g/dL (ref 30.0–36.0)
MCV: 91 fL (ref 78.0–100.0)
Monocytes Absolute: 0.5 10*3/uL (ref 0.1–1.0)
Monocytes Relative: 10 %
Neutro Abs: 3.1 10*3/uL (ref 1.7–7.7)
Neutrophils Relative %: 66 %
Platelets: 175 10*3/uL (ref 150–400)
RBC: 4.09 MIL/uL (ref 3.87–5.11)
RDW: 13.3 % (ref 11.5–15.5)
WBC: 4.7 10*3/uL (ref 4.0–10.5)

## 2017-10-17 LAB — BASIC METABOLIC PANEL
Anion gap: 10 (ref 5–15)
BUN: 23 mg/dL — ABNORMAL HIGH (ref 6–20)
CO2: 20 mmol/L — ABNORMAL LOW (ref 22–32)
Calcium: 9.1 mg/dL (ref 8.9–10.3)
Chloride: 105 mmol/L (ref 101–111)
Creatinine, Ser: 1.46 mg/dL — ABNORMAL HIGH (ref 0.44–1.00)
GFR calc Af Amer: 41 mL/min — ABNORMAL LOW (ref >60)
GFR calc non Af Amer: 36 mL/min — ABNORMAL LOW (ref >60)
Glucose, Bld: 117 mg/dL — ABNORMAL HIGH (ref 65–99)
Potassium: 4.8 mmol/L (ref 3.5–5.1)
Sodium: 135 mmol/L (ref 135–145)

## 2017-10-17 LAB — HEMOGLOBIN A1C
Hgb A1c MFr Bld: 6.7 % — ABNORMAL HIGH (ref 4.8–5.6)
Mean Plasma Glucose: 145.59 mg/dL

## 2017-10-17 LAB — CK: Total CK: 164 U/L (ref 38–234)

## 2017-10-17 LAB — GLUCOSE, CAPILLARY: Glucose-Capillary: 123 mg/dL — ABNORMAL HIGH (ref 65–99)

## 2017-10-17 MED ORDER — FAMOTIDINE 10 MG PO TABS
10.0000 mg | ORAL_TABLET | Freq: Every day | ORAL | Status: DC
  Administered 2017-10-18 – 2017-10-19 (×2): 10 mg via ORAL
  Filled 2017-10-17 (×2): qty 1

## 2017-10-17 MED ORDER — IPRATROPIUM-ALBUTEROL 0.5-2.5 (3) MG/3ML IN SOLN
3.0000 mL | RESPIRATORY_TRACT | Status: DC
  Administered 2017-10-17: 3 mL via RESPIRATORY_TRACT
  Filled 2017-10-17: qty 3

## 2017-10-17 MED ORDER — ALBUTEROL SULFATE (2.5 MG/3ML) 0.083% IN NEBU
5.0000 mg | INHALATION_SOLUTION | Freq: Once | RESPIRATORY_TRACT | Status: AC
  Administered 2017-10-17: 5 mg via RESPIRATORY_TRACT
  Filled 2017-10-17: qty 6

## 2017-10-17 MED ORDER — ENOXAPARIN SODIUM 30 MG/0.3ML ~~LOC~~ SOLN
30.0000 mg | SUBCUTANEOUS | Status: DC
  Administered 2017-10-18 – 2017-10-19 (×2): 30 mg via SUBCUTANEOUS
  Filled 2017-10-17 (×2): qty 0.3

## 2017-10-17 MED ORDER — ACETAMINOPHEN 325 MG PO TABS
650.0000 mg | ORAL_TABLET | Freq: Four times a day (QID) | ORAL | Status: DC | PRN
  Administered 2017-10-17: 650 mg via ORAL
  Filled 2017-10-17: qty 2

## 2017-10-17 MED ORDER — ACETAMINOPHEN 650 MG RE SUPP: 650.0000 mg | Freq: Four times a day (QID) | RECTAL | Status: DC | PRN

## 2017-10-17 MED ORDER — LACTATED RINGERS IV BOLUS (SEPSIS)
1000.0000 mL | Freq: Once | INTRAVENOUS | Status: AC
  Administered 2017-10-17: 1000 mL via INTRAVENOUS

## 2017-10-17 MED ORDER — METOPROLOL SUCCINATE ER 25 MG PO TB24
25.0000 mg | ORAL_TABLET | Freq: Every day | ORAL | Status: DC
  Administered 2017-10-18: 25 mg via ORAL
  Filled 2017-10-17: qty 1

## 2017-10-17 MED ORDER — ALBUTEROL SULFATE (2.5 MG/3ML) 0.083% IN NEBU: 2.5000 mg | INHALATION_SOLUTION | RESPIRATORY_TRACT | Status: DC | PRN

## 2017-10-17 MED ORDER — IPRATROPIUM-ALBUTEROL 0.5-2.5 (3) MG/3ML IN SOLN
3.0000 mL | Freq: Three times a day (TID) | RESPIRATORY_TRACT | Status: DC
  Administered 2017-10-18 – 2017-10-19 (×4): 3 mL via RESPIRATORY_TRACT
  Filled 2017-10-17 (×5): qty 3

## 2017-10-17 MED ORDER — PREDNISONE 50 MG PO TABS
50.0000 mg | ORAL_TABLET | Freq: Every day | ORAL | Status: DC
  Administered 2017-10-18 – 2017-10-19 (×2): 50 mg via ORAL
  Filled 2017-10-17 (×2): qty 1

## 2017-10-17 MED ORDER — OSELTAMIVIR PHOSPHATE 30 MG PO CAPS
30.0000 mg | ORAL_CAPSULE | Freq: Two times a day (BID) | ORAL | Status: DC
  Administered 2017-10-17 – 2017-10-19 (×4): 30 mg via ORAL
  Filled 2017-10-17 (×5): qty 1

## 2017-10-17 MED ORDER — MOMETASONE FURO-FORMOTEROL FUM 200-5 MCG/ACT IN AERO
2.0000 | INHALATION_SPRAY | Freq: Two times a day (BID) | RESPIRATORY_TRACT | Status: DC
  Administered 2017-10-18 – 2017-10-19 (×3): 2 via RESPIRATORY_TRACT
  Filled 2017-10-17: qty 8.8

## 2017-10-17 MED ORDER — PROMETHAZINE HCL 25 MG/ML IJ SOLN
12.5000 mg | Freq: Once | INTRAMUSCULAR | Status: AC
  Administered 2017-10-17: 12.5 mg via INTRAVENOUS
  Filled 2017-10-17: qty 1

## 2017-10-17 MED ORDER — FENTANYL CITRATE (PF) 100 MCG/2ML IJ SOLN
50.0000 ug | Freq: Once | INTRAMUSCULAR | Status: AC
  Administered 2017-10-17: 50 ug via INTRAVENOUS
  Filled 2017-10-17: qty 2

## 2017-10-17 MED ORDER — OSELTAMIVIR PHOSPHATE 75 MG PO CAPS: 75.0000 mg | ORAL_CAPSULE | Freq: Two times a day (BID) | ORAL | Status: DC

## 2017-10-17 MED ORDER — CARBIDOPA-LEVODOPA 25-100 MG PO TABS
1.0000 | ORAL_TABLET | Freq: Three times a day (TID) | ORAL | Status: DC
  Administered 2017-10-17 – 2017-10-19 (×6): 1 via ORAL
  Filled 2017-10-17 (×6): qty 1

## 2017-10-17 MED ORDER — INSULIN ASPART 100 UNIT/ML ~~LOC~~ SOLN
0.0000 [IU] | Freq: Three times a day (TID) | SUBCUTANEOUS | Status: DC
  Administered 2017-10-18: 1 [IU] via SUBCUTANEOUS
  Administered 2017-10-18: 2 [IU] via SUBCUTANEOUS
  Administered 2017-10-19: 3 [IU] via SUBCUTANEOUS
  Administered 2017-10-19 (×2): 1 [IU] via SUBCUTANEOUS

## 2017-10-17 NOTE — Telephone Encounter (Signed)
Hospital records reviewed.  She was dx with influenza (positive on testing).  This is likely why she feels so bad and is weak.  Make sure hydrating.  F/u with PCP if not feeling well.

## 2017-10-17 NOTE — Progress Notes (Signed)
PHARMACY NOTE:  ANTIMICROBIAL RENAL DOSAGE ADJUSTMENT  Current antimicrobial regimen includes a mismatch between antimicrobial dosage and estimated renal function.  As per policy approved by the Pharmacy & Therapeutics and Medical Executive Committees, the antimicrobial dosage will be adjusted accordingly.  Current antimicrobial dosage:  Tamiflu 75 mg po bid  Indication: Influenza  Renal Function:  Estimated Creatinine Clearance: 31.7 mL/min (A) (by C-G formula based on SCr of 1.46 mg/dL (H)). []      On intermittent HD, scheduled: []      On CRRT    Antimicrobial dosage has been changed to:  Tamiflu 30 mg po bid   Veronica Shaw, [facility] 10/17/2017 9:56 PM

## 2017-10-17 NOTE — ED Notes (Signed)
Dinner tray now at bedside

## 2017-10-17 NOTE — ED Notes (Addendum)
Pt presents with injuries from a fall after not taking her parkinsons medication x 3 days; pt here 3 days ago and dx with flu, recommended admission but patient refused; pt reports unwitnessed fall this morning at 2am and unsure how long she was unconscious for but awoke lying half in the bathroom and half in the bedroom; pt c/o headache, wheezing, diarrhea, and CP with coughing

## 2017-10-17 NOTE — Progress Notes (Signed)
Pt said she was told in the ED that she was supposed to have a CT of her head done as she was still c/o of severe headache with a rating of 7, meanwhile CT was already done and read as per her chart, pt verbalised not having any CT done on her today, that she only has a CXR, CT called who said she had it around 430pm, Doc on call for [facility] also paged and notified of pt's concern,  said will look into it, later got a call from her and said she did speak with the pt and explained to the pt that she did not really need the CT, pt had tab tylenol for the headache at 2215, will continue to monitor. Obasogie-Asidi, Oakley Orban Efe

## 2017-10-17 NOTE — Progress Notes (Signed)
Pt admitted from ED with Flu symptoms, alert and oriented, c/o of headache, settled in bed with call light at bedside, safety concern addressed accordingly with bed alarm activation, education given, was however reassured and will continue to monitor. Obasogie-Asidi, Sakshi Sermons Efe

## 2017-10-17 NOTE — ED Triage Notes (Signed)
Pt states she was here Sunday when she was dx with flu. Pt reports physician wanted to admit her but she declined. Pt reports she is still feeling bad today with cough, fever, diarrhea, and headache.

## 2017-10-17 NOTE — ED Provider Notes (Signed)
[facility] EMERGENCY DEPARTMENT Provider Note   CSN: [id number] Arrival date & time: 10/17/17  1114     History   Chief Complaint No chief complaint on file.   HPI Veronica Shaw is a 67 y.o. female.  HPI   67 year old female presenting after syncopal event.  Patient states that she was ambulating in her residence when she began to feel lightheaded and fell.  She thinks she had a brief loss of consciousness.  She has had several episodes of near syncopal symptoms over the last several day.  She began feeling ill this past Thursday.  On Sunday she was diagnosed with the flu.  She reports continued fevers, body aches, nausea, anorexia and generalized fatigue.  She did strike her head during the fall and does have a mild headache.  She is not anticoagulated.  Past Medical History:  Diagnosis Date  . Asthma   . Chronic systolic CHF (congestive heart failure) ([place])    a. cMRI 4/15: EF 34% and findings - c/w NICM, normal RV size and function (RVEF 61%), Mild MR // b. Echo 2/15:  EF 30-35%, diff HK, ant-sept AK, Gr 2 DD, mild MR, trivial TR  //  c. Echo 5/17: EF 20-25%, severe diffuse HK, marked systolic dyssynchrony, grade 1 diastolic dysfunction, mild MR  //  d. RHC 5/17: Fick CO 2.9, RVSP 19, PASP 15, PW mean 2, low filing pressures and preserved CO   . Diabetes mellitus   . Gastritis   . History of echocardiogram    Echo 6/18: EF 30-35, diffuse HK, grade 1 diastolic dysfunction, trivial MR, mild LAE, mild TR, no pericardial effusion  . History of nuclear stress test    Myoview 5/18: EF 49, no ischemia, inferoseptal defect c/w LBBB artifact (intermediate risk due to EF < 50).  [name] Kitchen HTN (hypertension)   . Hyperlipidemia   . NICM (nonischemic cardiomyopathy) ([place])    a. Nuclear 5/13: Normal stress nuclear study. LV Ejection Fraction: 58%  //  b. LHC 10/14: Minor luminal irregularity in prox LAD, EF 35%   . Parkinson disease ([place])   . Plantar fasciitis   . Sleep apnea      was retested and no longer had it and so d/c CPAP  . Urticaria     Patient Active Problem List   Diagnosis Date Noted  . Asthma exacerbation 08/03/2017  . Dense breast tissue on mammogram 05/30/2017  . Hair loss 07/21/2016  . Middle ear effusion, bilateral 04/28/2016  . Suprapubic pain 04/14/2016  . Allergic rhinitis 11/09/2015  . Upper airway cough syndrome 08/20/2015  . Osteopenia 07/10/2015  . Mild persistent asthma in adult without complication 07/37/1062  . Bunion, left foot 05/20/2014  . NICM (nonischemic cardiomyopathy) ([place]) 04/23/2014  . Arthritis or polyarthritis, rheumatoid ([place]) 03/12/2014  . ICD (implantable cardioverter-defibrillator), dual, in situ 03/06/2014  . Thoracic or lumbosacral neuritis or radiculitis, unspecified 07/03/2013  . Chronic systolic heart failure ([place]) 05/31/2013  . Parkinson disease ([place]) 05/21/2013  . 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 Major depression 03/31/2013  . Balance problem 03/03/2013  . Lower extremity edema 03/03/2013  . GERD (gastroesophageal reflux disease) 09/27/2012  . Blurry vision, bilateral 09/27/2012  . Dyspnea 09/19/2012  . Plantar fasciitis, bilateral 06/15/2012  . Cervical pain (neck) 04/12/2012  . 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 Diabetic peripheral neuropathy ([place]) 12/09/2011  . Hyperlipidemia 12/09/2011  . Sleep apnea 12/09/2011  . Fatigue 10/13/2011  . Chronic urticaria 05/27/2011  . Allergy to  walnuts 05/20/2011  . Sciatic leg pain 07/22/2009  . ROSACEA 05/29/2009  . ACHILLES BURSITIS OR TENDINITIS 03/27/2009  . Type II diabetes mellitus with complication ([place]) 93/23/5573  . Essential hypertension 09/28/2006    Past Surgical History:  Procedure Laterality Date  . BUNIONECTOMY    . CARDIAC CATHETERIZATION N/A 12/16/2015   Procedure: Right Heart Cath;  Surgeon: Sherren Mocha, MD;  Location: [facility];  Service: Cardiovascular;  Laterality: N/A;  . CHOLECYSTECTOMY    .  IMPLANTABLE CARDIOVERTER DEFIBRILLATOR IMPLANT  11-25-13   MDT dual chamber ICD implanted by [name] for primary prevention  . IMPLANTABLE CARDIOVERTER DEFIBRILLATOR IMPLANT N/A 11/25/2013   Procedure: IMPLANTABLE CARDIOVERTER DEFIBRILLATOR IMPLANT;  Surgeon: [name], MD;  Location: [facility] CATH LAB;  Service: Cardiovascular;  Laterality: N/A;  . LEFT AND RIGHT HEART CATHETERIZATION WITH CORONARY ANGIOGRAM N/A 05/31/2013   Procedure: LEFT AND RIGHT HEART CATHETERIZATION WITH CORONARY ANGIOGRAM;  Surgeon: [name], MD;  Location[facility] CATH LAB;  Service: Cardiovascular;  Laterality: N/A;  . TONSILLECTOMY    . TUBAL LIGATION      OB History    No data available       Home Medications    Prior to Admission medications   Medication Sig Start Date End Date Taking? Authorizing Provider  albuterol (PROVENTIL HFA;VENTOLIN HFA) 108 (90 Base) MCG/ACT inhaler Inhale 2 puffs into the lungs every 4 (four) hours as needed for wheezing or shortness of breath. 05/30/17   Rogue Bussing, MD  albuterol (PROVENTIL) (2.5 MG/3ML) 0.083% nebulizer solution Take 3 mLs (2.5 mg total) by nebulization every 6 (six) hours as needed for wheezing or shortness of breath. 08/03/17   Deneise Lever, MD  budesonide-formoterol (SYMBICORT) 160-4.5 MCG/ACT inhaler Inhale 2 puffs into the lungs 2 (two) times daily. 05/30/17   Rogue Bussing, MD  Calcium Carb-Cholecalciferol 600-800 MG-UNIT TABS Take 1 tablet by mouth daily. 11/23/16   Rogue Bussing, MD  carbidopa-levodopa (SINEMET IR) 25-100 MG tablet Take 1 tablet by mouth 3 (three) times daily. 10/09/17   Tat, Eustace Quail, DO  furosemide (LASIX) 40 MG tablet Take 0.5 tablets (20 mg total) by mouth daily as needed for fluid (For swelling). 09/22/17   Rogue Bussing, MD  HYDROcodone-homatropine [facility]) 5-1.5 MG/5ML syrup Take 5 mLs by mouth every 6 (six) hours as needed for cough. 10/15/17   Kirichenko, Tatyana, PA-C  losartan  (COZAAR) 25 MG tablet Take 1 tablet (25 mg total) by mouth daily. 02/17/17 05/18/17  Sherren Mocha, MD  metFORMIN (GLUCOPHAGE) 500 MG tablet TAKE 2 TABLETS TWICE DAILY WITH A MEAL 01/16/17   Rogue Bussing, MD  metoprolol succinate (TOPROL-XL) 50 MG 24 hr tablet TAKE 1 AND 1/2 TABLETS DAILY FOR 1 WEEK, THEN INCREASE TO 2 TABLETS DAILY AS DIRECTED (DOSE CHANGE) Patient taking differently: 2 TABLETS DAILY AS DIRECTED (DOSE CHANGE) 06/20/16   [name], MD  potassium chloride (K-DUR,KLOR-CON) 10 MEQ tablet TAKE 1 TABLET DAILY AS NEEDED (TAKE WITH LASIX AS NEEDED FOR SWELLING). 07/18/17   Rogue Bussing, MD  ranitidine (ZANTAC) 150 MG tablet Take 1 tablet (150 mg total) by mouth 2 (two) times daily. 07/28/17   Rogue Bussing, MD  spironolactone (ALDACTONE) 25 MG tablet Take 0.5 tablets (12.5 mg total) by mouth daily. 03/08/17 09/17/17  Daune Perch, NP  Sulfamethoxazole-Trimethoprim (BACTRIM PO) Take by mouth.    [provider]    Family History Family History  Problem Relation Age of Onset  .  Coronary artery disease Father        Died age 71  . Heart attack Father   . Diabetes Father   . Coronary artery disease Mother        Died age 82  . Heart attack Mother   . Diabetes Mother   . Stroke Mother   . Parkinson's disease Sister   . Heart disease Sister   . Hepatitis C Sister   . Diabetes Son 37       T1DM  . Diabetes Sister   . Heart disease Sister   . Diabetes Sister   . Heart disease Sister   . Breast cancer Neg Hx     Social History Social History   Tobacco Use  . Smoking status: Never Smoker  . Smokeless tobacco: Never Used  . Tobacco comment: exposed to smoke during child hood (parents)  Substance Use Topics  . Alcohol use: No    Alcohol/week: 0.0 oz  . Drug use: No     Allergies   Aspirin; Penicillins; Prempro [conj estrog-medroxyprogest ace]; Ace inhibitors; and Simvastatin   Review of Systems Review of  Systems  All systems reviewed and negative, other than as noted in HPI.  Physical Exam Updated Vital Signs BP (!) 100/48 (BP Location: Right Arm)   Pulse 86   Temp 99.2 F (37.3 C) (Oral)   Resp 16   LMP 08/01/2000 (Approximate)   SpO2 96%   Physical Exam  Constitutional: She is oriented to person, place, and time. She appears well-developed and well-nourished. No distress.  HENT:  Head: Normocephalic and atraumatic.  Eyes: Conjunctivae are normal. Right eye exhibits no discharge. Left eye exhibits no discharge.  Neck: Neck supple.  Cardiovascular: Normal rate, regular rhythm and normal heart sounds. Exam reveals no gallop and no friction rub.  No murmur heard. Pulmonary/Chest: Effort normal. No respiratory distress. She has wheezes.  Abdominal: Soft. She exhibits no distension. There is no tenderness.  Musculoskeletal: She exhibits no edema or tenderness.  Neurological: She is alert and oriented to person, place, and time. She displays normal reflexes. No cranial nerve deficit. She exhibits normal muscle tone.  Skin: Skin is warm and dry.  Psychiatric: She has a normal mood and affect. Her behavior is normal. Thought content normal.  Nursing note and vitals reviewed.    ED Treatments / Results  Labs (all labs ordered are listed, but only abnormal results are displayed) Labs Reviewed  BASIC METABOLIC PANEL - Abnormal; Notable for the following components:      Result Value   CO2 20 (*)    Glucose, Bld 117 (*)    BUN 23 (*)    Creatinine, Ser 1.46 (*)    GFR calc non Af Amer 36 (*)    GFR calc Af Amer 41 (*)    All other components within normal limits  CBC WITH DIFFERENTIAL/PLATELET    EKG  EKG Interpretation  Date/Time:  Tuesday October 17 2017 16:58:48 EDT Ventricular Rate:  78 PR Interval:    QRS Duration: 140 QT Interval:  439 QTC Calculation: 501 R Axis:   -34 Text Interpretation:  Sinus rhythm Left bundle branch block No significant change since last  tracing Confirmed by Virgel Manifold [phone]) on 10/17/2017 5:01:30 PM Also confirmed by Virgel Manifold [phone]), editor Hattie Perch ([id number])  on 10/18/2017 7:35:08 AM       Radiology Dg Chest 2 View  Result Date: 10/17/2017 CLINICAL DATA:  Productive cough, mid chest discomfort, and shortness of  breath for the past 5 days. History of previous episodes of pneumonia and bronchitis. History of CHF, asthma EXAM: CHEST - 2 VIEW COMPARISON:  PA and lateral chest x-ray of October 15, 2017. FINDINGS: The lungs remain hyperinflated. There is new increased density at the left lung base laterally. There is stable linear increased density at the right lung base. The heart and pulmonary vascularity are normal. The ICD is in stable position. There is mild multilevel degenerative disc disease of the thoracic spine. IMPRESSION: COPD. Subsegmental atelectasis has developed at the left lung base. Atelectasis or scarring is stable in the right infrahilar region. No acute pneumonia nor CHF. Electronically Signed   By: David  Martinique M.D.   On: 10/17/2017 12:58    Procedures Procedures (including critical care time)  Medications Ordered in ED Medications  albuterol (PROVENTIL) (2.5 MG/3ML) 0.083% nebulizer solution 5 mg (5 mg Nebulization Given 10/17/17 1217)     Initial Impression / Assessment and Plan / ED Course  I have reviewed the triage vital signs and the nursing notes.  Pertinent labs & imaging results that were available during my care of the patient were reviewed by me and considered in my medical decision making (see chart for details).    67 year old female with recurrent syncope.  Recent diagnosis of the flu.  There may be some element of dehydration.  Blood pressure somewhat soft.  She does not appear to be septic though.  Given continued lightheadedness and recurrent nature, will admit.   Final Clinical Impressions(s) / ED Diagnoses   Final diagnoses:  Syncope, unspecified syncope type     ED Discharge Orders    None       Virgel Manifold, MD 10/19/17 [phone]

## 2017-10-17 NOTE — Telephone Encounter (Signed)
Patient called in tears needing to speak with you. She said she had went to [facility] ER over the weekend with the Flu. She did not take any of her medication Friday, Saturday or Sunday. She said her shaking is so bad that she keeps dropping things. She needs to know whether to start a whole pill today or half as she usually would. Please Call. Thanks

## 2017-10-17 NOTE — Telephone Encounter (Signed)
Patient states they wanted to admit her to the hospital, but she refused. Now she is feeling weak. She has had falls. No LOC. Has not hit her head. She has started back on 1/2 tablet TID of Levodopa and feels a little better with tremors. I advised she could stay on 1/2 if she wasn't comfortable with advancing the titration while ill. Made aware PD symptoms get worse with illness.   Advised if still feeling weak to go back to the hospital as she may need fluids, etc. Dr. Carles Collet - FYI.

## 2017-10-17 NOTE — H&P (Addendum)
[facility] Admission History and Physical Service Pager: [phone]  Patient name: Veronica Shaw Medical record number: [id number] Date of birth: 1950/12/04 Age: 67 y.o. Gender: female  Primary Care Provider: Rogue Bussing, MD Consultants:  Code Status: full  Chief Complaint: syncope  Assessment and Plan: Veronica Shaw is a 67 y.o. female presenting with syncope. PMH is significant for asthma, HFrEF 30-35% DM2, HTN, HLD, parkinsons  Syncope/Dehydration 2/2 influenza Likely due to dehydration/weakness from influenza A diagnosed on prior ED visit 10/17/17. She has had several recent falls from weakness and fatigue, most recent was last night where she was down for unknown length of time and had hit her head but CT head normal and mentating well on admission. Patient has shortness of breath and wheezing on exam but with good air movement and O2 sat 99% on RA without leukocytosis so does not meet sepsis. Will not start ABX and monitor. She does have a slight AKI Cr 1.4 which is improved from 1.6 seen on ED visit on 10/17/17 with baseline Cr 1.1. Did receive 1L bolus in ED and appeared euvolemic on admit, given HFrEF will not give further fluids at this time. Repeated nature of falls with no residual neuro deficits makes embolic etiology unlikely.  Lack of Chest pain with unchanged LBBB on ECG makes cardiac etiology less likely. -admit to med-surg, Dr. Andria Frames -tamiflu x 5d since hospitalized although patient is >48h from onset -prednisone 50 x5days -albuterol q2 prn -duoneb Q4h scheduled -droplet precautions -CBC/bmp in am -CK pending  HFrEF-euvolemic on exam, not in acute exacerbation. BP soft on admit 90/50s -no IVF and monitor fluid status -metoprolol XL 25mg  qd reduced from home 50 mg BID -holding home spironolactone, losartan and lasix given soft BPs  DM2 controlled. A1c 6.6 on 05/30/2017. At home on metformin 1000mg  BID -holding home metformin for  slight AKI -sSSI - monitor CBGs  Asthma: with mild wheezing on exam from influenza A. At home on symbicort 2 puffs BID which patient is compliant on. -albuterol q2prn as above -duonebs q4 as above -dulera while admitted  Parkinsons: Recently started on sinemet but missed last 3 days and has h/o medication noncompliance -restart home sinemet  FEN/GI: heart/carb diet- phenergan-pepcid Prophylaxis: lovenox  Disposition: med-surg  History of Present Illness:  KENDEL Shaw is a 67 y.o. female presenting with syncope.   She has been feeling sick with flu symptoms since Friday and was seen in ED on 10/15/17 and found to be influenza A positive. Per chart review, she was offered admission at that time but declined. She presents back to the ED today stated that since last ED visit she has had several falls, approximately 4 in total, last was about 2am and has a headache from where she thinks hit her head on sink.  She woke up on the floor and initially called her husband from the other room to help but he did not understand so she had to get up by herself.   She does not have any idea how long she was down.   She states that since Friday has continued to feel warm with fever as Tmax at home 102.39F, chills, tingling sensation, fatigue, shortness of breath. At baseline can walk short distances and only a few steps but this has become more difficult with her shortness of breath.She also endorses a productive cough with nonbloody greenish-yellow sputum. She does endorse rhinorrhea and post nasal drip but granddaughter at bedside states that this  is chronic for patient. She states that has had a poor appetite recently and has not been drinking much either leading to some decreased UOP. Has had diarrhea, with 3 times last night.  Does not take her medications like she is supposed to.  Never started parkinsons medications like she was supposed to, started with 1/2 tablet them forgot to take for 3 days. Does  not take her pills but she does take symbicort regularly and uses her albuterol inhaler  every 4 hours as instructed   Review Of Systems: Per HPI with the following additions:   Review of Systems  Constitutional: Positive for chills, fever and malaise/fatigue. Negative for diaphoresis.  HENT: Positive for congestion. Negative for ear pain, sinus pain and sore throat.   Respiratory: Positive for cough.   Cardiovascular: Negative for chest pain, palpitations and leg swelling.  Gastrointestinal: Positive for diarrhea. Negative for abdominal pain, blood in stool, nausea and vomiting.  Genitourinary: Negative for dysuria.  Musculoskeletal: Positive for falls. Negative for myalgias.  Skin: Negative for rash.  Neurological: Positive for weakness and headaches. Negative for focal weakness.    Patient Active Problem List   Diagnosis Date Noted  . Flu 10/17/2017  . Asthma exacerbation 08/03/2017  . Dense breast tissue on mammogram 05/30/2017  . Hair loss 07/21/2016  . Middle ear effusion, bilateral 04/28/2016  . Suprapubic pain 04/14/2016  . Allergic rhinitis 11/09/2015  . Upper airway cough syndrome 08/20/2015  . Osteopenia 07/10/2015  . Mild persistent asthma in adult without complication 22/29/7989  . Bunion, left foot 05/20/2014  . NICM (nonischemic cardiomyopathy) ([place]) 04/23/2014  . Arthritis or polyarthritis, rheumatoid ([place]) 03/12/2014  . ICD (implantable cardioverter-defibrillator), dual, in situ 03/06/2014  . Thoracic or lumbosacral neuritis or radiculitis, unspecified 07/03/2013  . Chronic systolic heart failure ([place]) 05/31/2013  . Parkinson disease ([place]) 05/21/2013  . 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 Major depression 03/31/2013  . Balance problem 03/03/2013  . Lower extremity edema 03/03/2013  . GERD (gastroesophageal reflux disease) 09/27/2012  . Blurry vision, bilateral 09/27/2012  . Dyspnea 09/19/2012  . Plantar fasciitis, bilateral 06/15/2012  .  Cervical pain (neck) 04/12/2012  . 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 Diabetic peripheral neuropathy ([place]) 12/09/2011  . Hyperlipidemia 12/09/2011  . Sleep apnea 12/09/2011  . Fatigue 10/13/2011  . Chronic urticaria 05/27/2011  . 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 05/20/2011  . Sciatic leg pain 07/22/2009  . ROSACEA 05/29/2009  . ACHILLES BURSITIS OR TENDINITIS 03/27/2009  . Type II diabetes mellitus with complication ([place]) 21/19/4174  . Essential hypertension 09/28/2006    Past Medical History: Past Medical History:  Diagnosis Date  . Asthma   . Chronic systolic CHF (congestive heart failure) ([place])    a. cMRI 4/15: EF 34% and findings - c/w NICM, normal RV size and function (RVEF 61%), Mild MR // b. Echo 2/15:  EF 30-35%, diff HK, ant-sept AK, Gr 2 DD, mild MR, trivial TR  //  c. Echo 5/17: EF 20-25%, severe diffuse HK, marked systolic dyssynchrony, grade 1 diastolic dysfunction, mild MR  //  d. RHC 5/17: Fick CO 2.9, RVSP 19, PASP 15, PW mean 2, low filing pressures and preserved CO   . Diabetes mellitus   . Gastritis   . History of echocardiogram    Echo 6/18: EF 30-35, diffuse HK, grade 1 diastolic dysfunction, trivial MR, mild LAE, mild TR, no pericardial effusion  . History of nuclear stress test    Myoview 5/18: EF 49, no ischemia, inferoseptal defect c/w  LBBB artifact (intermediate risk due to EF < 50).  [name] Kitchen HTN (hypertension)   . Hyperlipidemia   . NICM (nonischemic cardiomyopathy) ([place])    a. Nuclear 5/13: Normal stress nuclear study. LV Ejection Fraction: 58%  //  b. LHC 10/14: Minor luminal irregularity in prox LAD, EF 35%   . Parkinson disease ([place])   . Plantar fasciitis   . Sleep apnea    was retested and no longer had it and so d/c CPAP  . Urticaria     Past Surgical History: Past Surgical History:  Procedure Laterality Date  . BUNIONECTOMY    . CARDIAC CATHETERIZATION N/A 12/16/2015   Procedure: Right Heart Cath;  Surgeon: Sherren Mocha, MD;  Location: [facility];  Service: Cardiovascular;  Laterality: N/A;  . CHOLECYSTECTOMY    . IMPLANTABLE CARDIOVERTER DEFIBRILLATOR IMPLANT  11-25-13   MDT dual chamber ICD implanted by [name] for primary prevention  . IMPLANTABLE CARDIOVERTER DEFIBRILLATOR IMPLANT N/A 11/25/2013   Procedure: IMPLANTABLE CARDIOVERTER DEFIBRILLATOR IMPLANT;  Surgeon: [name], MD;  Location: [facility] CATH LAB;  Service: Cardiovascular;  Laterality: N/A;  . LEFT AND RIGHT HEART CATHETERIZATION WITH CORONARY ANGIOGRAM N/A 05/31/2013   Procedure: LEFT AND RIGHT HEART CATHETERIZATION WITH CORONARY ANGIOGRAM;  Surgeon: [name], MD;  Location: [facility] CATH [facility];  Service: Cardiovascular;  Laterality: N/A;  . TONSILLECTOMY    . TUBAL LIGATION      Social History: Social History   Tobacco Use  . Smoking status: Never Smoker  . Smokeless tobacco: Never Used  . Tobacco comment: exposed to smoke during child hood (parents)  Substance Use Topics  . Alcohol use: No    Alcohol/week: 0.0 oz  . Drug use: No   Additional social history: lives at home with husband. At baseline bathes and dresses self.  Please also refer to relevant sections of EMR.  Family History: Family History  Problem Relation Age of Onset  . Coronary artery disease Father        Died age 56  . Heart attack Father   . Diabetes Father   . Coronary artery disease Mother        Died age 49  . Heart attack Mother   . Diabetes Mother   . Stroke Mother   . Parkinson's disease Sister   . Heart disease Sister   . Hepatitis C Sister   . Diabetes Son 37       T1DM  . Diabetes Sister   . Heart disease Sister   . Diabetes Sister   . Heart disease Sister   . Breast cancer Neg Hx     Allergies and Medications: Allergies  Allergen Reactions  . Aspirin Swelling and Other (See Comments)    Other reaction(s): Other (See Comments) Causes nose bleeds *ONLY THE COATED ASA* Causes nose bleeds *ONLY THE COATED ASA*  . Penicillins Shortness Of Breath and Rash     Shortness of Breath - Throat felt like it was closing.   [name] [Conj Estrog-Medroxyprogest Ace] Shortness Of Breath    Throat swelling Throat swelling  . Ace Inhibitors Cough  . Simvastatin Other (See Comments) and Rash    Muscle aches   No current facility-administered medications on file prior to encounter.    Current Outpatient Medications on File Prior to Encounter  Medication Sig Dispense Refill  . albuterol (PROVENTIL HFA;VENTOLIN HFA) 108 (90 Base) MCG/ACT inhaler Inhale 2 puffs into the lungs every 4 (four) hours as needed for wheezing  or shortness of breath. 18 g 3  . albuterol (PROVENTIL) (2.5 MG/3ML) 0.083% nebulizer solution Take 3 mLs (2.5 mg total) by nebulization every 6 (six) hours as needed for wheezing or shortness of breath. 90 mL 12  . bismuth subsalicylate (PEPTO BISMOL) 262 MG/15ML suspension Take 30 mLs by mouth every 6 (six) hours as needed.    . budesonide-formoterol (SYMBICORT) 160-4.5 MCG/ACT inhaler Inhale 2 puffs into the lungs 2 (two) times daily. 1 Inhaler 12  . Calcium Carb-Cholecalciferol 600-800 MG-UNIT TABS Take 1 tablet by mouth daily. 90 tablet 3  . carbidopa-levodopa (SINEMET IR) 25-100 MG tablet Take 1 tablet by mouth 3 (three) times daily. 270 tablet 1  . furosemide (LASIX) 40 MG tablet Take 0.5 tablets (20 mg total) by mouth daily as needed for fluid (For swelling). 30 tablet 3  . HYDROcodone-homatropine (HYCODAN) 5-1.5 MG/5ML syrup Take 5 mLs by mouth every 6 (six) hours as needed for cough. 120 mL 0  . losartan (COZAAR) 25 MG tablet Take 1 tablet (25 mg total) by mouth daily. 90 tablet 3  . metFORMIN (GLUCOPHAGE) 500 MG tablet TAKE 2 TABLETS TWICE DAILY WITH A MEAL (Patient taking differently: TAKE 1000 mg TABLETS TWICE DAILY WITH A MEAL) 360 tablet 3  . metoprolol succinate (TOPROL-XL) 50 MG 24 hr tablet TAKE 1 AND 1/2 TABLETS DAILY FOR 1 WEEK, THEN INCREASE TO 2 TABLETS DAILY AS DIRECTED (DOSE CHANGE) (Patient taking differently: 50 mg in the  morning and 50 mg in the evening) 180 tablet 3  . potassium chloride (K-DUR,KLOR-CON) 10 MEQ tablet TAKE 1 TABLET DAILY AS NEEDED (TAKE WITH LASIX AS NEEDED FOR SWELLING). (Patient taking differently: TAKE 10 mg  TABLET DAILY AS NEEDED (TAKE WITH LASIX AS NEEDED FOR SWELLING).) 30 tablet 6  . ranitidine (ZANTAC) 150 MG tablet Take 1 tablet (150 mg total) by mouth 2 (two) times daily. (Patient taking differently: Take 150 mg by mouth as needed. ) 90 tablet 1  . spironolactone (ALDACTONE) 25 MG tablet Take 0.5 tablets (12.5 mg total) by mouth daily. 90 tablet 3    Objective: BP (!) 113/50   Pulse 80   Temp 99.2 F (37.3 C) (Oral)   Resp (!) 23   LMP 08/01/2000 (Approximate)   SpO2 99%  Exam: General: obese, in NAD, but anxious Eyes: clear sclera, EOMI, PERRLA ENTM: rhinorhea w/o epistaxis, MMM Neck: no lesions, no ROM deficits noticed Cardiovascular: RRR, 2/6 murmur Respiratory: course lung sounds with mild wheezing, no visible IWB Gastrointestinal: soft, w/ no TTP MSK: no deficits in limb movement, no wounds noted Derm: no rashes/lesions Neuro: CN grossly intact, sensation intact bilaterally Psych: sometimes confused abotu details but alert and appropriate thought process  Labs and Imaging: CBC BMET  Recent Labs  Lab 10/17/17 1215  WBC 4.7  HGB 12.1  HCT 37.2  PLT 175   Recent Labs  Lab 10/17/17 1215  NA 135  K 4.8  CL 105  CO2 20*  BUN 23*  CREATININE 1.46*  GLUCOSE 117*  CALCIUM 9.1      Dg Chest 2 View  Result Date: 10/17/2017 CLINICAL DATA:  Productive cough, mid chest discomfort, and shortness of breath for the past 5 days. History of previous episodes of pneumonia and bronchitis. History of CHF, asthma EXAM: CHEST - 2 VIEW COMPARISON:  PA and lateral chest x-ray of October 15, 2017. FINDINGS: The lungs remain hyperinflated. There is new increased density at the left lung base laterally. There is stable linear increased density at  the right lung base. The heart  and pulmonary vascularity are normal. The ICD is in stable position. There is mild multilevel degenerative disc disease of the thoracic spine. IMPRESSION: COPD. Subsegmental atelectasis has developed at the left lung base. Atelectasis or scarring is stable in the right infrahilar region. No acute pneumonia nor CHF. Electronically Signed   By: David  [place] M.D.   On: 10/17/2017 12:58   Ct Head Wo Contrast  Result Date: 10/17/2017 CLINICAL DATA:  Unwitnessed fall today.  Headache. EXAM: CT HEAD WITHOUT CONTRAST TECHNIQUE: Contiguous axial images were obtained from the base of the skull through the vertex without intravenous contrast. COMPARISON:  None. FINDINGS: Brain: There is no evidence of acute infarct, intracranial hemorrhage, intra-axial mass, midline shift, or extra-axial fluid collection. 2 cm focally asymmetric extra-axial CSF in the left parafalcine region near the vertex mildly displaces the adjacent frontal lobe gyri and may represent an incidental arachnoid cyst. The ventricles are normal in size. Periventricular and subcortical white matter hypoattenuation bilaterally is nonspecific but compatible with mild chronic small vessel ischemic disease. Vascular: Calcified atherosclerosis at the skull base. No hyperdense vessel. Skull: No fracture or focal osseous lesion. Sinuses/Orbits: Minimal mucosal thickening in the maxillary and ethmoid sinuses bilaterally. Trace left maxillary sinus fluid. Clear mastoid air cells. Unremarkable orbits. Other: None. IMPRESSION: 1. No evidence of acute intracranial abnormality. 2. Mild chronic small vessel ischemic disease. Electronically Signed   By: Logan Bores M.D.   On: 10/17/2017 17:10    Sherene Sires, DO 10/17/2017, 9:50 PM PGY-1, [place] Intern pager: [phone], text pages welcome  FPTS Upper-Level Resident Addendum  I have independently interviewed and examined the patient. I have discussed the above with the original author  and agree with their documentation. My edits for correction/addition/clarification are in blue. Please see also any attending notes.   Bufford Lope, DO PGY-2, [facility] 10/17/2017 11:04 PM  [place] Service pager: [phone] (text pages welcome through [facility])

## 2017-10-17 NOTE — Telephone Encounter (Signed)
I did inform the patient of these things. To push hydration and to follow up with PCP or with hospital if she feels she needs to do so or isn't getting enough fluids.

## 2017-10-18 ENCOUNTER — Inpatient Hospital Stay (HOSPITAL_COMMUNITY): Payer: Medicare Other

## 2017-10-18 ENCOUNTER — Other Ambulatory Visit: Payer: Self-pay

## 2017-10-18 DIAGNOSIS — J441 Chronic obstructive pulmonary disease with (acute) exacerbation: Secondary | ICD-10-CM

## 2017-10-18 DIAGNOSIS — R296 Repeated falls: Secondary | ICD-10-CM

## 2017-10-18 DIAGNOSIS — I5022 Chronic systolic (congestive) heart failure: Secondary | ICD-10-CM

## 2017-10-18 DIAGNOSIS — J45901 Unspecified asthma with (acute) exacerbation: Secondary | ICD-10-CM

## 2017-10-18 LAB — BASIC METABOLIC PANEL
Anion gap: 9 (ref 5–15)
BUN: 17 mg/dL (ref 6–20)
CO2: 24 mmol/L (ref 22–32)
Calcium: 9 mg/dL (ref 8.9–10.3)
Chloride: 106 mmol/L (ref 101–111)
Creatinine, Ser: 1.24 mg/dL — ABNORMAL HIGH (ref 0.44–1.00)
GFR calc Af Amer: 51 mL/min — ABNORMAL LOW (ref >60)
GFR calc non Af Amer: 44 mL/min — ABNORMAL LOW (ref >60)
Glucose, Bld: 100 mg/dL — ABNORMAL HIGH (ref 65–99)
Potassium: 4.4 mmol/L (ref 3.5–5.1)
Sodium: 139 mmol/L (ref 135–145)

## 2017-10-18 LAB — CBC
HCT: 35.6 % — ABNORMAL LOW (ref 36.0–46.0)
Hemoglobin: 11.5 g/dL — ABNORMAL LOW (ref 12.0–15.0)
MCH: 29.6 pg (ref 26.0–34.0)
MCHC: 32.3 g/dL (ref 30.0–36.0)
MCV: 91.5 fL (ref 78.0–100.0)
Platelets: 164 10*3/uL (ref 150–400)
RBC: 3.89 MIL/uL (ref 3.87–5.11)
RDW: 13 % (ref 11.5–15.5)
WBC: 3.1 10*3/uL — ABNORMAL LOW (ref 4.0–10.5)

## 2017-10-18 LAB — GLUCOSE, CAPILLARY
Glucose-Capillary: 93 mg/dL (ref 65–99)
Glucose-Capillary: 123 mg/dL — ABNORMAL HIGH (ref 65–99)
Glucose-Capillary: 199 mg/dL — ABNORMAL HIGH (ref 65–99)
Glucose-Capillary: 169 mg/dL — ABNORMAL HIGH (ref 65–99)

## 2017-10-18 LAB — ECHOCARDIOGRAM COMPLETE
Height: 62 in
Weight: 2197.55 oz

## 2017-10-18 MED ORDER — METOPROLOL SUCCINATE ER 25 MG PO TB24
50.0000 mg | ORAL_TABLET | Freq: Two times a day (BID) | ORAL | Status: DC
  Administered 2017-10-18 – 2017-10-19 (×2): 50 mg via ORAL
  Filled 2017-10-18 (×3): qty 2

## 2017-10-18 NOTE — Progress Notes (Signed)
SLP Cancellation Note  Patient Details Name: Veronica Shaw MRN: [id number] DOB: [birth year]   Cancelled treatment:       Reason Eval/Treat Not Completed: SLP screened, no needs identified, will sign off   [name] 10/18/2017, 3:45 PM

## 2017-10-18 NOTE — Progress Notes (Signed)
Patient has had CT done, she talked to her spouse who said she received medication prior to the CT and doesn't remember.

## 2017-10-18 NOTE — Progress Notes (Addendum)
Family Medicine Teaching Service Daily Progress Note Intern Pager: [phone]  Patient name: WILLO YOON Medical record number: [id number] Date of birth: [birth year] Age: 68 y.o. Gender: female  Primary Care Provider: Rogue Bussing, MD Consultants: None Code Status: Full  Pt Overview and Major Events to Date:  DONNICA JARNAGIN is a 67 y.o. female presenting with syncope. PMH is significant for asthma, HFrEF 30-35% DM2, HTN, HLD, and Parkinson's.  Assessment and Plan:  Syncope/Dehydration 2/2 influenza Likely due to dehydration/weakness from influenza A diagnosed on prior ED visit 10/17/17. She has had several recent falls from weakness and fatigue, most recent was last night where she was down for unknown length of time and had hit her head but mentating well on admission. Patient has shortness of breath and wheezing on exam but with good air movement and O2 sat 99% on RA without leukocytosis so does not meet sepsis. Repeated nature of falls with no residual neuro deficits makes embolic etiology unlikely.  -tamiflu x 5d since hospitalized although patient is >48h from onset -prednisone 50 x5days -albuterol q2 prn -duoneb Q4h scheduled -droplet precautions -consult cardiology to interpret ICD for syncopal episodes -MOCA testing, RPR, TSH, vit B12 pending  HFrEF-euvolemic on exam, not in acute exacerbation. BP soft on admit 90/50s. S/p 1L bolus NS -metoprolol XL 25mg  qd reduced from home 50 mg BID -holding home spironolactone, losartan and lasix given soft Bps -Repeat ECHO  DM2 controlled. A1c 6.7. At home on metformin 1000mg  BID -holding home metformin for slight AKI -sSSI - monitor CBGs  Asthma: with mild wheezing on exam from influenza A. At home on symbicort 2 puffs BID which patient is compliant on. -albuterol q2prn as above -duonebs q4 as above -dulera while admitted  Parkinsons: Recently started on sinemet but missed last 3 days and has h/o medication  noncompliance -restart home sinemet  FEN/GI: heart/carb diet- phenergan-pepcid Prophylaxis: lovenox  Disposition: continued inpatient stay  Subjective:  Patient did not have CT per her. She is concerned that's he may need one.   Objective: Temp:  [98.2 F (36.8 C)-99.4 F (37.4 C)] 98.2 F (36.8 C) (03/20 0531) Pulse Rate:  [75-91] 78 (03/20 0531) Resp:  [10-23] 18 (03/20 0531) BP: (84-126)/(36-67) 110/50 (03/20 0531) SpO2:  [94 %-100 %] 96 % (03/20 0531) Weight:  [137 lb 5.6 oz (62.3 kg)] 137 lb 5.6 oz (62.3 kg) (03/19 2202) Physical Exam: General: NAD, pleasant Eyes: PERRL, EOMI, no conjunctival pallor or injection ENTM: Moist mucous membranes Cardiovascular: RRR, no m/r/g, no LE edema Respiratory: CTA BL, wheezing noted at bases, normal work of breathing Gastrointestinal: soft, nontender, nondistended, normoactive BS MSK: moves 4 extremities equally Derm: no rashes appreciated Neuro: CN II-XII grossly intact Psych: AOx3, appropriate affect  Laboratory: Recent Labs  Lab 10/15/17 1815 10/17/17 1215 10/18/17 0521  WBC 3.7* 4.7 3.1*  HGB 11.9* 12.1 11.5*  HCT 37.6 37.2 35.6*  PLT 166 175 164   Recent Labs  Lab 10/15/17 1815 10/17/17 1215 10/18/17 0521  NA 133* 135 139  K 4.0 4.8 4.4  CL 100* 105 106  CO2 21* 20* 24  BUN 24* 23* 17  CREATININE 1.61* 1.46* 1.24*  CALCIUM 9.0 9.1 9.0  GLUCOSE 131* 117* 100*   Hgb A1c 6.7 CK 164  Imaging/Diagnostic Tests:   Meilyn Heindl, [place], DO 10/18/2017, 8:35 AM PGY-1, [place] Intern pager: [phone], text pages welcome

## 2017-10-18 NOTE — Discharge Summary (Signed)
[facility] Discharge Summary  Patient name: Veronica Shaw Medical record number: [id number] Date of birth: June 11, 1951 Age: 67 y.o. Gender: female Date of Admission: 10/17/2017  Date of Discharge: 10/19/2017 Admitting Physician: Zenia Resides, MD  Primary Care Provider: Rogue Bussing, MD Consultants: Cardiology  Indication for Hospitalization: Syncope  Discharge Diagnoses/Problem List:  Syncope 2/2 Dehydration 2/2 influenza Chronic HFpEF (prev HFrEF) T2DM Asthma Parkinson's Disease Cognitive Impairment  Disposition: Home  Discharge Condition: stable  Discharge Exam:  General: NAD, pleasant ENTM: Moist mucous membranes Cardiovascular: RRR, no m/r/g, no LE edema Respiratory: CTA BL, normal work of breathing on room air  Gastrointestinal: soft, nontender, nondistended, normoactive BS MSK: moves 4 extremities equally Derm: no rashes appreciated Psych: Appropriate affect  Brief Hospital Course:  This 67 year old female presented to the ED after multiple syncopal episodes.  Patient was recently diagnosed with influenza on 10/17/17.  Patient reported several falls from weakness and fatigue and also an episode of hitting her head.  Patient received CT without contrast which showed no signs of acute abnormalities had no focal symptoms.  Patient was started on Tamiflu for 5 days since being hospitalized although patient was greater than 48 hours from onset of flu.  Patient also started on prednisone 50 mg for 5 days.  Patient was given albuterol and DuoNeb treatments while hospitalized.    On admission patient noted to have slight AK I with a creatinine of 1.4 which is improved from 1.6 seen on her ED visit on 10/17/17.  Baseline creatinine 1.1.  Patient received 1 L bolus of NS in ED.  On admission patient's blood pressures are noted to be lower and her home spironolactone, losartan, and Lasix were held given her low blood pressures.  Cardiology was  consulted during admission in order to determine if ICD noted any signs of arrhythmia as cause of syncope.  Cardiology recommended holding her spironolactone, losartan, and Lasix and to restart after discharge.  No signs of arrhythmia noted on ICD.  Echo was repeated which showed an improved EF to 60-65% with a G2DD.  Prior to discharge patient was afebrile for greater than 48 hours.  Patient was also having good intake and her blood pressure had improved.  Patient's AK I had also resolved.  Patient was continuing to have diarrhea prior to discharge however C. difficile testing was negative.  Patient was sent home with prednisone and Tamiflu to complete course.  While admitted patient did have Casey testing due to confusion regarding having her CT head performed.  MOCA scoring was 23/30.   Issues for Follow Up:  1. MOCA was administered with a score of 23/30. 2. Patient with normal orthostatic vitals.  3. Patient taking metoprolol succinate BID, instead of once daily dosing as recommended. If she has persistent presyncopal episodes with standing would consider decreasing Toprol. 4. Patient instructed to restart her medications after discharge.  May need repeat BMP if patient continues to have vomiting and diarrhea. 5. Patient referred for outpatient PT.  Significant Procedures: none  Significant Labs and Imaging:  Recent Labs  Lab 10/17/17 1215 10/18/17 0521 10/19/17 0712  WBC 4.7 3.1* 4.5  HGB 12.1 11.5* 12.2  HCT 37.2 35.6* 36.5  PLT 175 164 188   Recent Labs  Lab 10/15/17 1815 10/17/17 1215 10/18/17 0521 10/19/17 0712  NA 133* 135 139 139  K 4.0 4.8 4.4 4.1  CL 100* 105 106 103  CO2 21* 20* 24 25  GLUCOSE 131* 117* 100*  121*  BUN 24* 23* 17 18  CREATININE 1.61* 1.46* 1.24* 1.16*  CALCIUM 9.0 9.1 9.0 9.5   C. Diff negative Vitamin B12 TSH 0.458 RPR nonreactive  ECHO: Study Conclusions - Left ventricle: The cavity size was normal. Wall thickness was   normal. Systolic  function was normal. The estimated ejection   fraction was in the range of 60% to 65%. Wall motion was normal;   there were no regional wall motion abnormalities. Features are   consistent with a pseudonormal left ventricular filling pattern,   with concomitant abnormal relaxation and increased filling   pressure (grade 2 diastolic dysfunction).  Results/Tests Pending at Time of Discharge: none  Discharge Medications:  Allergies as of 10/19/2017      Reactions   Aspirin Swelling, Other (See Comments)   Other reaction(s): Other (See Comments) Causes nose bleeds *ONLY THE COATED ASA* Causes nose bleeds *ONLY THE COATED ASA*   Penicillins Shortness Of Breath, Rash   Shortness of Breath - Throat felt like it was closing.    Prempro [conj Estrog-medroxyprogest Ace] Shortness Of Breath   Throat swelling Throat swelling   Ace Inhibitors Cough   Simvastatin Other (See Comments), Rash   Muscle aches      Medication List    STOP taking these medications   bismuth subsalicylate 101 BP/10CH suspension Commonly known as:  PEPTO BISMOL     TAKE these medications   albuterol 108 (90 Base) MCG/ACT inhaler Commonly known as:  PROVENTIL HFA;VENTOLIN HFA Inhale 2 puffs into the lungs every 4 (four) hours as needed for wheezing or shortness of breath.   albuterol (2.5 MG/3ML) 0.083% nebulizer solution Commonly known as:  PROVENTIL Take 3 mLs (2.5 mg total) by nebulization every 6 (six) hours as needed for wheezing or shortness of breath.   budesonide-formoterol 160-4.5 MCG/ACT inhaler Commonly known as:  SYMBICORT Inhale 2 puffs into the lungs 2 (two) times daily.   Calcium Carb-Cholecalciferol 600-800 MG-UNIT Tabs Take 1 tablet by mouth daily.   carbidopa-levodopa 25-100 MG tablet Commonly known as:  SINEMET IR Take 1 tablet by mouth 3 (three) times daily.   furosemide 40 MG tablet Commonly known as:  LASIX Take 0.5 tablets (20 mg total) by mouth daily as needed for fluid (For  swelling).   HYDROcodone-homatropine 5-1.5 MG/5ML syrup Commonly known as:  HYCODAN Take 5 mLs by mouth every 6 (six) hours as needed for cough.   losartan 25 MG tablet Commonly known as:  COZAAR Take 1 tablet (25 mg total) by mouth daily.   metFORMIN 500 MG tablet Commonly known as:  GLUCOPHAGE TAKE 2 TABLETS TWICE DAILY WITH A MEAL What changed:  See the new instructions.   metoprolol succinate 50 MG 24 hr tablet Commonly known as:  TOPROL-XL TAKE 1 AND 1/2 TABLETS DAILY FOR 1 WEEK, THEN INCREASE TO 2 TABLETS DAILY AS DIRECTED (DOSE CHANGE) What changed:  See the new instructions.   oseltamivir 30 MG capsule Commonly known as:  TAMIFLU Take 1 capsule (30 mg total) by mouth 2 (two) times daily for 5 days.   potassium chloride 10 MEQ tablet Commonly known as:  K-DUR,KLOR-CON TAKE 1 TABLET DAILY AS NEEDED (TAKE WITH LASIX AS NEEDED FOR SWELLING). What changed:  See the new instructions.   predniSONE 50 MG tablet Commonly known as:  DELTASONE Take 1 tablet (50 mg total) by mouth daily with breakfast for 2 days.   ranitidine 150 MG tablet Commonly known as:  ZANTAC Take 1 tablet (150 mg  total) by mouth 2 (two) times daily. What changed:    when to take this  reasons to take this   spironolactone 25 MG tablet Commonly known as:  ALDACTONE Take 0.5 tablets (12.5 mg total) by mouth daily.       Discharge Instructions: Please refer to Patient Instructions section of EMR for full details.  Patient was counseled important signs and symptoms that should prompt return to medical care, changes in medications, dietary instructions, activity restrictions, and follow up appointments.   Follow-Up Appointments: Follow-up Information    [facility].   Specialty:  Rehabilitation Why:  they will contact you for the first visit Contact information: [address]  [id number] [place], [place], DO 10/20/2017, 2:07 PM PGY-1, [place]

## 2017-10-18 NOTE — Evaluation (Signed)
Occupational Therapy Evaluation Patient Details Name: Veronica Shaw MRN: [id number] DOB: 1951-03-22 Today's Date: 10/18/2017    History of Present Illness Pt is a 67 y/o female with a PMH significant for asthma, HF, DMII, HTN, Parkinson's Disease. Pt presents with syncope and several falls at home - she tested positive for influenza A prior to ED visit (same day), and was found to be dehydrated. CT head negative for acute changes.    Clinical Impression   Pt was admitted for the above. She feels a little weaker than her baseline, which is mod I.  Currently, she needs min guard for balance.  Of note, she has had several falls at home. Will follow in acute setting with supervision level goals.       Follow Up Recommendations  Supervision/Assistance - 24 hour    Equipment Recommendations  (children bought her tub bench and 3:1)    Recommendations for Other Services       Precautions / Restrictions Precautions Precautions: Fall Precaution Comments: History of Parkinson's Restrictions Weight Bearing Restrictions: No      Mobility Bed Mobility Overal bed mobility: Independent                Transfers   Equipment used: Straight cane   Sit to Stand: Min guard         General transfer comment: for safety    Balance Overall balance assessment: History of Falls                                         ADL either performed or assessed with clinical judgement   ADL Overall ADL's : Needs assistance/impaired                                       General ADL Comments: pt needs set up vs. min guard assist to gather items.  She is a little unsteady but no LOB when ambulating. Simulated tub bench to show her how to use it at home.  She has been experiencing periods of lightheadedness.  Husband sleeps in another room with dogs. Recommended that she call him during the night to come assist her (and keep BSC by bed initially). Also talked about  planning for places to sit to stand against wall if too far away as she gets lightheaded when standing.  Noted BP was on low side this pm 102/48.  Tremor present but does not interfere with self feeding. Explained keeping elbow on table to stabilize if needed     Vision         Perception     Praxis      Pertinent Vitals/Pain Pain Assessment: No/denies pain     Hand Dominance     Extremity/Trunk Assessment Upper Extremity Assessment Upper Extremity Assessment: Generalized weakness(bil tremors; has Parkinsons)           Communication Communication Communication: No difficulties   Cognition Arousal/Alertness: Awake/alert Behavior During Therapy: WFL for tasks assessed/performed Overall Cognitive Status: Within Functional Limits for tasks assessed                                     General Comments       Exercises  Shoulder Instructions      Home Living Family/patient expects to be discharged to:: Private residence Living Arrangements: Spouse/significant other Available Help at Discharge: Family;Available 24 hours/day               Bathroom Shower/Tub: Teacher, early years/pre: Standard         Additional Comments: children have picked up a tub bench and 3;1 commode for pt      Prior Functioning/Environment Level of Independence: Independent with assistive device(s)        Comments: Using the cane all the time at home. Per husband, as long as she has the cane she does not require any other assistance.         OT Problem List: Decreased strength;Impaired balance (sitting and/or standing);Decreased knowledge of use of DME or AE      OT Treatment/Interventions: Self-care/ADL training;Energy conservation;DME and/or AE instruction;Patient/family education;Balance training;Therapeutic activities    OT Goals(Current goals can be found in the care plan section) Acute Rehab OT Goals Patient Stated Goal: Stop falling at  home OT Goal Formulation: With patient Time For Goal Achievement: 11/01/17 Potential to Achieve Goals: Good ADL Goals Pt Will Transfer to Toilet: with supervision;ambulating;bedside commode Pt Will Perform Tub/Shower Transfer: Tub transfer;with min guard assist;tub bench Additional ADL Goal #1: pt will gather clothes at supervision level and complete adl without supervision  OT Frequency: Min 2X/week   Barriers to D/C:            Co-evaluation              AM-PAC PT "6 Clicks" Daily Activity     Outcome Measure Help from another person eating meals?: None Help from another person taking care of personal grooming?: A Little Help from another person toileting, which includes using toliet, bedpan, or urinal?: A Little Help from another person bathing (including washing, rinsing, drying)?: A Little Help from another person to put on and taking off regular upper body clothing?: A Little Help from another person to put on and taking off regular lower body clothing?: A Little 6 Click Score: 19   End of Session    Activity Tolerance: Patient tolerated treatment well Patient left: in bed;with call bell/phone within reach;with family/visitor present  OT Visit Diagnosis: Unsteadiness on feet (R26.81)                Time: 8841-6606 OT Time Calculation (min): 15 min Charges:  OT General Charges $OT Visit: 1 Visit OT Evaluation $OT Eval Low Complexity: 1 Low G-Codes:     Veronica Shaw, Veronica Shaw [phone] 10/18/2017  Veronica Shaw 10/18/2017, 3:52 PM

## 2017-10-18 NOTE — Evaluation (Signed)
Physical Therapy Evaluation Patient Details Name: Veronica Shaw MRN: [id number] DOB: 1951-04-30 Today's Date: 10/18/2017   History of Present Illness  Pt is a 67 y/o female with a PMH significant for asthma, HF, DMII, HTN, Parkinson's Disease. Pt presents with syncope and several falls at home - she tested positive for influenza A prior to ED visit (same day), and was found to be dehydrated on admission. CT head negative for acute changes.    Clinical Impression  Pt admitted with above diagnosis. Pt currently with functional limitations due to the deficits listed below (see PT Problem List). At the time of PT eval pt was able to perform transfers and ambulation with gross min guard assist for balance support and safety. Pt reports many falls at home and attributes them to urgency getting to the toilet, as well as missing the toilet seat, and losing balance while stepping into the shower. She reports getting dizzy/lightheaded when getting up quickly from the bed and rushing into the bathroom. We discussed the benefits of utilizing a BSC for the times she feels she needs to rush to the bathroom. We also discussed possible use of the rollator to aide in balance and manage fatigue as her tolerance for functional activity is low. Pt will benefit from skilled PT to increase their independence and safety with mobility to allow discharge to the venue listed below.       Follow Up Recommendations Outpatient PT;Supervision for mobility/OOB    Equipment Recommendations  3in1 (PT)    Recommendations for Other Services       Precautions / Restrictions Precautions Precautions: Fall Precaution Comments: History of Parkinson's Restrictions Weight Bearing Restrictions: No      Mobility  Bed Mobility               General bed mobility comments: Pt was received exiting bathroom with SPC   Transfers Overall transfer level: Needs assistance Equipment used: Straight cane Transfers: Sit to/from  Stand Sit to Stand: Min guard         General transfer comment: Close guard for safety as pt powered up to full standing position. Slight unsteadiness noted but pt able to correct without assistance.   Ambulation/Gait Ambulation/Gait assistance: Min guard Ambulation Distance (Feet): 100 Feet Assistive device: Straight cane Gait Pattern/deviations: Step-through pattern;Decreased stride length;Decreased weight shift to left;Antalgic Gait velocity: Decreased Gait velocity interpretation: Below normal speed for age/gender General Gait Details: Slightly antalgic. Pt was able to sequence well with the [facility], however appeared off balance and guarded. Hands-on guarding provided throughout gait training.   Stairs            Wheelchair Mobility    Modified Rankin (Stroke Patients Only)       Balance Overall balance assessment: History of Falls(Urgency to toilet, Missing toilet seat, stepping in shower)                                           Pertinent Vitals/Pain Pain Assessment: Faces Faces Pain Scale: No hurt    Home Living Family/patient expects to be discharged to:: Private residence Living Arrangements: Spouse/significant other Available Help at Discharge: Family;Available 24 hours/day Type of Home: House Home Access: Level entry     Home Layout: One level Home Equipment: Cane - quad      Prior Function Level of Independence: Independent with assistive device(s)  Comments: Using the cane all the time at home. Per husband, as long as she has the cane she does not require any other assistance.      Hand Dominance        Extremity/Trunk Assessment   Upper Extremity Assessment Upper Extremity Assessment: Defer to OT evaluation    Lower Extremity Assessment Lower Extremity Assessment: LLE deficits/detail LLE Deficits / Details: Decreased strength and AROM reported in "L shoulder, hip, and knee". Unsure if this is from a specific  event, or progressive. Pt unable to tell me.     Cervical / Trunk Assessment Cervical / Trunk Assessment: Normal  Communication   Communication: No difficulties  Cognition Arousal/Alertness: Awake/alert Behavior During Therapy: WFL for tasks assessed/performed Overall Cognitive Status: Within Functional Limits for tasks assessed                                        General Comments      Exercises     Assessment/Plan    PT Assessment Patient needs continued PT services  PT Problem List Decreased strength;Decreased range of motion;Decreased activity tolerance;Decreased balance;Decreased mobility;Decreased knowledge of use of DME;Decreased safety awareness;Decreased knowledge of precautions       PT Treatment Interventions DME instruction;Gait training;Stair training;Functional mobility training;Therapeutic activities;Therapeutic exercise;Neuromuscular re-education;Patient/family education    PT Goals (Current goals can be found in the Care Plan section)  Acute Rehab PT Goals Patient Stated Goal: Stop falling at home PT Goal Formulation: With patient/family Time For Goal Achievement: 10/25/17 Potential to Achieve Goals: Good    Frequency Min 3X/week   Barriers to discharge        Co-evaluation               AM-PAC PT "6 Clicks" Daily Activity  Outcome Measure Difficulty turning over in bed (including adjusting bedclothes, sheets and blankets)?: None Difficulty moving from lying on back to sitting on the side of the bed? : A Little Difficulty sitting down on and standing up from a chair with arms (e.g., wheelchair, bedside commode, etc,.)?: A Little Help needed moving to and from a bed to chair (including a wheelchair)?: A Little Help needed walking in hospital room?: A Little Help needed climbing 3-5 steps with a railing? : A Lot 6 Click Score: 18    End of Session Equipment Utilized During Treatment: Gait belt Activity Tolerance: Patient  limited by fatigue Patient left: in chair;with call bell/phone within reach;with family/visitor present Nurse Communication: Mobility status PT Visit Diagnosis: Unsteadiness on feet (R26.81);Repeated falls (R29.6);Muscle weakness (generalized) (M62.81)    Time: 1540-0867 PT Time Calculation (min) (ACUTE ONLY): 21 min   Charges:   PT Evaluation $PT Eval Moderate Complexity: 1 Mod     PT G Codes:        Veronica Shaw, PT, DPT Acute Rehabilitation Services Pager: [phone]   Thelma Comp 10/18/2017, 2:15 PM

## 2017-10-18 NOTE — Progress Notes (Signed)
  Echocardiogram 2D Echocardiogram has been performed.  Veronica Shaw 10/18/2017, 3:46 PM

## 2017-10-18 NOTE — Consult Note (Addendum)
Cardiology Consultation:   Patient ID: [name]; [id number]; February 06, 1951   Admit date: 10/17/2017 Date of Consult: 10/18/2017  Primary Care Provider: Rogue Bussing, MD Primary Cardiologist: Sherren Mocha, MD Primary Electrophysiologist:  Dr. Lovena Le   Patient Profile:   Veronica Shaw is a 67 y.o. female with a PMH of NICM (Cath 05/2013 with minimal nonobstructive CAD; last Echo 12/2016 with EF 30-35%) s/p ICD, chronic systolic CHF, DM type 2, asthma, PD, and recent diagnosis of influenza A 10/15/17 who is being seen today for the evaluation of syncope at the request of Dr. Andria Frames.  History of Present Illness:   [name] was in her usual state of health until ~1 week ago when she developed a productive cough, SOB, wheezing, fever, myalgias, and nasal congestion. She presented to the urgent care clinic 3/17 with the above complaints and was recommended to present to the ED where she was diagnosed with influenza A. She was thought to be dehydrated at the time given tachycardia and AKI, however patient requested to be discharged home.   Over the past 2 days she has been feeling generally weak and reports frequent falls. Overnight 3/18-3/19 she experienced a syncopal event while ambulating to the bathroom with subsequent head strike and unknown period of LOC. She states she stood up from bed and while ambulating felt lightheaded and syncopized. She did not experience any chest pain before or after the event. She denied bowel/bladder incontinence or tongue biting. Given syncopal event, she decided to be reevaluated in the ED.   She was last evaluated by cardiology outpatient 06/2017 and thought to be doing well from a cardiac standpoint. Patient was thought to be euvolemic with some DOE which was unchanged. She noted increased unsteadiness on her feet 2/2 her PD and recurrent falls. No medication changes occurred at this visit and she was recommended to follow-up with an APP in 6 months.    Currently she is still having SOB and coughing. She notes intermittent exertional L-sided chest pressure over the past several months, which occasionally occurs at rest. She states the pressure subsides with rest after 1-2 minutes and is associated with some lightheadedness. She denies associated dizziness, nausea, vomiting, or diaphoresis. She notes baseline DOE which she feels has been worsening over the past several months, however she attributes this to cold weather. She denies orthopnea, PND, or LE edema.   Hospital course: Hypotensive, otherwise VSS. Labs notable for electrolytes wnl, Cr 1.24 today (improved from 1.61 3/17 after IVF; baseline 1.1), no leukocytosis, Hgb 11.5, PLT 164, CK wnl. EKG with sinus rhythm with LBBB (chronic). CT head negative for acute findings. CXR without acute findings. Patient was started on tamiflu and admitted to medicine. Cardiology following for syncopal event.   Past Medical History:  Diagnosis Date  . Asthma   . Chronic systolic CHF (congestive heart failure) ([place])    a. cMRI 4/15: EF 34% and findings - c/w NICM, normal RV size and function (RVEF 61%), Mild MR // b. Echo 2/15:  EF 30-35%, diff HK, ant-sept AK, Gr 2 DD, mild MR, trivial TR  //  c. Echo 5/17: EF 20-25%, severe diffuse HK, marked systolic dyssynchrony, grade 1 diastolic dysfunction, mild MR  //  d. RHC 5/17: Fick CO 2.9, RVSP 19, PASP 15, PW mean 2, low filing pressures and preserved CO   . Diabetes mellitus   . Gastritis   . History of echocardiogram    Echo 6/18: EF 30-35, diffuse HK, grade  1 diastolic dysfunction, trivial MR, mild LAE, mild TR, no pericardial effusion  . History of nuclear stress test    Myoview 5/18: EF 49, no ischemia, inferoseptal defect c/w LBBB artifact (intermediate risk due to EF < 50).  [name] Kitchen HTN (hypertension)   . Hyperlipidemia   . NICM (nonischemic cardiomyopathy) ([place])    a. Nuclear 5/13: Normal stress nuclear study. LV Ejection Fraction: 58%  //  b. LHC 10/14:  Minor luminal irregularity in prox LAD, EF 35%   . Parkinson disease ([place])   . Plantar fasciitis   . Sleep apnea    was retested and no longer had it and so d/c CPAP  . Urticaria     Past Surgical History:  Procedure Laterality Date  . BUNIONECTOMY    . CARDIAC CATHETERIZATION N/A 12/16/2015   Procedure: Right Heart Cath;  Surgeon: Sherren Mocha, MD;  Location: [facility];  Service: Cardiovascular;  Laterality: N/A;  . CHOLECYSTECTOMY    . IMPLANTABLE CARDIOVERTER DEFIBRILLATOR IMPLANT  11-25-13   MDT dual chamber ICD implanted by [name] for primary prevention  . IMPLANTABLE CARDIOVERTER DEFIBRILLATOR IMPLANT N/A 11/25/2013   Procedure: IMPLANTABLE CARDIOVERTER DEFIBRILLATOR IMPLANT;  Surgeon: [name], MD;  Location: [facility] CATH LAB;  Service: Cardiovascular;  Laterality: N/A;  . LEFT AND RIGHT HEART CATHETERIZATION WITH CORONARY ANGIOGRAM N/A 05/31/2013   Procedure: LEFT AND RIGHT HEART CATHETERIZATION WITH CORONARY ANGIOGRAM;  Surgeon: [name], MD;  Location: [facility] CATH LAB;  Service: Cardiovascular;  Laterality: N/A;  . TONSILLECTOMY    . TUBAL LIGATION       Home Medications:  Prior to Admission medications   Medication Sig Start Date End Date Taking? Authorizing Provider  albuterol (PROVENTIL HFA;VENTOLIN HFA) 108 (90 Base) MCG/ACT inhaler Inhale 2 puffs into the lungs every 4 (four) hours as needed for wheezing or shortness of breath. 05/30/17  Yes Rogue Bussing, MD  albuterol (PROVENTIL) (2.5 MG/3ML) 0.083% nebulizer solution Take 3 mLs (2.5 mg total) by nebulization every 6 (six) hours as needed for wheezing or shortness of breath. 08/03/17  Yes Young, Tarri Fuller D, MD  bismuth subsalicylate (PEPTO BISMOL) 262 MG/15ML suspension Take 30 mLs by mouth every 6 (six) hours as needed.   Yes [provider]  budesonide-formoterol (SYMBICORT) 160-4.5 MCG/ACT inhaler Inhale 2 puffs into the lungs 2 (two) times daily. 05/30/17  Yes Rogue Bussing, MD  Calcium Carb-Cholecalciferol 600-800 MG-UNIT TABS Take 1 tablet by mouth daily. 11/23/16  Yes Rogue Bussing, MD  carbidopa-levodopa (SINEMET IR) 25-100 MG tablet Take 1 tablet by mouth 3 (three) times daily. 10/09/17  Yes Tat, Eustace Quail, DO  furosemide (LASIX) 40 MG tablet Take 0.5 tablets (20 mg total) by mouth daily as needed for fluid (For swelling). 09/22/17  Yes Fitzgerald, Sharman Cheek, MD  HYDROcodone-homatropine [facility]) 5-1.5 MG/5ML syrup Take 5 mLs by mouth every 6 (six) hours as needed for cough. 10/15/17  Yes Kirichenko, Tatyana, PA-C  losartan (COZAAR) 25 MG tablet Take 1 tablet (25 mg total) by mouth daily. 02/17/17 10/17/17 Yes Sherren Mocha, MD  metFORMIN (GLUCOPHAGE) 500 MG tablet TAKE 2 TABLETS TWICE DAILY WITH A MEAL Patient taking differently: TAKE 1000 mg TABLETS TWICE DAILY WITH A MEAL 01/16/17  Yes Rogue Bussing, MD  metoprolol succinate (TOPROL-XL) 50 MG 24 hr tablet TAKE 1 AND 1/2 TABLETS DAILY FOR 1 WEEK, THEN INCREASE TO 2 TABLETS DAILY AS DIRECTED (DOSE CHANGE) Patient taking differently: 50 mg in the morning and 50 mg  in the evening 06/20/16  Yes [name], MD  potassium chloride (K-DUR,KLOR-CON) 10 MEQ tablet TAKE 1 TABLET DAILY AS NEEDED (TAKE WITH LASIX AS NEEDED FOR SWELLING). Patient taking differently: TAKE 10 mg  TABLET DAILY AS NEEDED (TAKE WITH LASIX AS NEEDED FOR SWELLING). 07/18/17  Yes Rogue Bussing, MD  ranitidine (ZANTAC) 150 MG tablet Take 1 tablet (150 mg total) by mouth 2 (two) times daily. Patient taking differently: Take 150 mg by mouth as needed.  07/28/17  Yes Rogue Bussing, MD  spironolactone (ALDACTONE) 25 MG tablet Take 0.5 tablets (12.5 mg total) by mouth daily. 03/08/17 10/17/17 Yes Daune Perch, NP    Inpatient Medications: Scheduled Meds: . carbidopa-levodopa  1 tablet Oral TID  . enoxaparin (LOVENOX) injection  30 mg Subcutaneous Q24H  . famotidine  10 mg Oral Daily  . insulin  aspart  0-9 Units Subcutaneous TID WC  . ipratropium-albuterol  3 mL Nebulization TID  . metoprolol succinate  25 mg Oral Daily  . mometasone-formoterol  2 puff Inhalation BID  . oseltamivir  30 mg Oral BID  . predniSONE  50 mg Oral Q breakfast   Continuous Infusions:  PRN Meds: acetaminophen **OR** acetaminophen, albuterol  Allergies:    Allergies  Allergen Reactions  . Aspirin Swelling and Other (See Comments)    Other reaction(s): Other (See Comments) Causes nose bleeds *ONLY THE COATED ASA* Causes nose bleeds *ONLY THE COATED ASA*  . Penicillins Shortness Of Breath and Rash    Shortness of Breath - Throat felt like it was closing.   [name] [Conj Estrog-Medroxyprogest Ace] Shortness Of Breath    Throat swelling Throat swelling  . Ace Inhibitors Cough  . Simvastatin Other (See Comments) and Rash    Muscle aches    Social History:   Social History   Socioeconomic History  . Marital status: Married    Spouse name: Not on file  . Number of children: 2  . Years of education: Not on file  . Highest education level: Not on file  Social Needs  . Financial resource strain: Not on file  . Food insecurity - worry: Not on file  . Food insecurity - inability: Not on file  . Transportation needs - medical: Not on file  . Transportation needs - non-medical: Not on file  Occupational History  . Occupation: retired    Comment: CNA  Tobacco Use  . Smoking status: Never Smoker  . Smokeless tobacco: Never Used  . Tobacco comment: exposed to smoke during child hood (parents)  Substance and Sexual Activity  . Alcohol use: No    Alcohol/week: 0.0 oz  . Drug use: No  . Sexual activity: Yes  Other Topics Concern  . Not on file  Social History Narrative   Lives at home with husband and the dog.    Family History:    Family History  Problem Relation Age of Onset  . Coronary artery disease Father        Died age 10  . Heart attack Father   . Diabetes Father   .  Coronary artery disease Mother        Died age 58  . Heart attack Mother   . Diabetes Mother   . Stroke Mother   . Parkinson's disease Sister   . Heart disease Sister   . Hepatitis C Sister   . Diabetes Son 37       T1DM  . Diabetes Sister   . Heart disease  Sister   . Diabetes Sister   . Heart disease Sister   . Breast cancer Neg Hx      ROS:  Please see the history of present illness.   All other ROS reviewed and negative.     Physical Exam/Data:   Vitals:   10/18/17 0053 10/18/17 0531 10/18/17 0934 10/18/17 1150  BP: (!) 93/48 (!) 110/50 (!) 106/45 (!) 102/48  Pulse: 75 78 77 85  Resp: 19 18 18 18   Temp: 98.3 F (36.8 C) 98.2 F (36.8 C) 98.7 F (37.1 C) 98.3 F (36.8 C)  TempSrc: Oral Oral Oral Oral  SpO2: 96% 96% 94% 96%  Weight:      Height:        Intake/Output Summary (Last 24 hours) at 10/18/2017 1308 Last data filed at 10/17/2017 1906 Gross per 24 hour  Intake 1000 ml  Output -  Net 1000 ml   Filed Weights   10/17/17 2202  Weight: 137 lb 5.6 oz (62.3 kg)   Body mass index is 25.12 kg/m.  General:  Well nourished, well developed, laying in bed in no acute distress HEENT: sclera anicteric  Neck: no JVD Vascular: No carotid bruits; distal pulses 2+ bilaterally Cardiac:  normal S1, S2; RRR; no murmurs, gallops, or rubs Lungs:  Diffuse wheezing; no rhonchi or rales  Abd: NABS, soft, nontender, no hepatomegaly Ext: no edema Musculoskeletal:  No deformities, BUE and BLE strength normal and equal Skin: warm and dry  Neuro:  Baseline tremor, no focal abnormalities noted Psych:  Normal affect   EKG:  The EKG was personally reviewed and demonstrates:  Sinus rhythm with LBBB (chronic) Telemetry:  Not on telemetry  Relevant CV Studies: Echocardiogram 12/2016: Study Conclusions  - Left ventricle: The cavity size was normal. Wall thickness was   normal. Systolic function was moderately to severely reduced. The   estimated ejection fraction was in the  range of 30% to 35%.   Diffuse hypokinesis. Doppler parameters are consistent with   abnormal left ventricular relaxation (grade 1 diastolic   dysfunction). - Aortic valve: Trileaflet; mildly thickened, mildly calcified   leaflets. - Mitral valve: There was trivial regurgitation. - Left atrium: The atrium was mildly dilated. Volume, ES, (1-plane   Simpson&'s, A2C): 35.1 ml. - Right ventricle: Pacer wire or catheter noted in right ventricle. - Right atrium: Pacer wire or catheter noted in right atrium. - Tricuspid valve: There was mild regurgitation.  Impressions:  - Similar to prior study (EF was 25%)  NST 11/2016:  Nuclear stress EF: 49%.  No T wave inversion was noted during stress.  There was no ST segment deviation noted during stress.  Defect 1: There is a large defect of moderate severity.  This is an intermediate risk study.   Large size, moderate intensity fixed inferoseptal/septal perfusion defect consistent with LBBB artifact. No reversible ischemia. LVEF 49% with incoordinate septal motion. This is an intermediate risk study (due to LVEF <50).  Laboratory Data:  Chemistry Recent Labs  Lab 10/15/17 1815 10/17/17 1215 10/18/17 0521  NA 133* 135 139  K 4.0 4.8 4.4  CL 100* 105 106  CO2 21* 20* 24  GLUCOSE 131* 117* 100*  BUN 24* 23* 17  CREATININE 1.61* 1.46* 1.24*  CALCIUM 9.0 9.1 9.0  GFRNONAA 32* 36* 44*  GFRAA 37* 41* 51*  ANIONGAP 12 10 9     No results for input(s): PROT, ALBUMIN, AST, ALT, ALKPHOS, BILITOT in the last 168 hours. Hematology Recent Labs  Lab  10/15/17 1815 10/17/17 1215 10/18/17 0521  WBC 3.7* 4.7 3.1*  RBC 4.10 4.09 3.89  HGB 11.9* 12.1 11.5*  HCT 37.6 37.2 35.6*  MCV 91.7 91.0 91.5  MCH 29.0 29.6 29.6  MCHC 31.6 32.5 32.3  RDW 13.1 13.3 13.0  PLT 166 175 164   Cardiac EnzymesNo results for input(s): TROPONINI in the last 168 hours.  Recent Labs  Lab 10/15/17 1824  TROPIPOC 0.00    BNP Recent Labs  Lab  10/15/17 1820  BNP 26.9    DDimer No results for input(s): DDIMER in the last 168 hours.  Radiology/Studies:  Dg Chest 2 View  Result Date: 10/17/2017 CLINICAL DATA:  Productive cough, mid chest discomfort, and shortness of breath for the past 5 days. History of previous episodes of pneumonia and bronchitis. History of CHF, asthma EXAM: CHEST - 2 VIEW COMPARISON:  PA and lateral chest x-ray of October 15, 2017. FINDINGS: The lungs remain hyperinflated. There is new increased density at the left lung base laterally. There is stable linear increased density at the right lung base. The heart and pulmonary vascularity are normal. The ICD is in stable position. There is mild multilevel degenerative disc disease of the thoracic spine. IMPRESSION: COPD. Subsegmental atelectasis has developed at the left lung base. Atelectasis or scarring is stable in the right infrahilar region. No acute pneumonia nor CHF. Electronically Signed   By: David  Martinique M.D.   On: 10/17/2017 12:58   Dg Chest 2 View  Result Date: 10/15/2017 CLINICAL DATA:  Headache.  Productive cough. EXAM: CHEST - 2 VIEW COMPARISON:  Chest radiograph 04/07/2017. FINDINGS: Multi lead AICD device overlies the left hemithorax. Stable cardiac and mediastinal contours. No consolidative pulmonary opacities. No pleural effusion or pneumothorax. Thoracic spine degenerative changes. IMPRESSION: No acute cardiopulmonary process. Electronically Signed   By: Lovey Newcomer M.D.   On: 10/15/2017 15:15   Ct Head Wo Contrast  Result Date: 10/17/2017 CLINICAL DATA:  Unwitnessed fall today.  Headache. EXAM: CT HEAD WITHOUT CONTRAST TECHNIQUE: Contiguous axial images were obtained from the base of the skull through the vertex without intravenous contrast. COMPARISON:  None. FINDINGS: Brain: There is no evidence of acute infarct, intracranial hemorrhage, intra-axial mass, midline shift, or extra-axial fluid collection. 2 cm focally asymmetric extra-axial CSF in the  left parafalcine region near the vertex mildly displaces the adjacent frontal lobe gyri and may represent an incidental arachnoid cyst. The ventricles are normal in size. Periventricular and subcortical white matter hypoattenuation bilaterally is nonspecific but compatible with mild chronic small vessel ischemic disease. Vascular: Calcified atherosclerosis at the skull base. No hyperdense vessel. Skull: No fracture or focal osseous lesion. Sinuses/Orbits: Minimal mucosal thickening in the maxillary and ethmoid sinuses bilaterally. Trace left maxillary sinus fluid. Clear mastoid air cells. Unremarkable orbits. Other: None. IMPRESSION: 1. No evidence of acute intracranial abnormality. 2. Mild chronic small vessel ischemic disease. Electronically Signed   By: Logan Bores M.D.   On: 10/17/2017 17:10    Assessment and Plan:   1. Syncope: patient presented with weakness/dehydration 2/2 influenza A diagnosed on prior ED visit 3/17. On 3/19 she experienced a syncopal episode with subsequent head strike and LOC for unknown amount of time. EKG with sinus rhythm with LBBB (unchanged from previous). ICD interrogation 3/20 without arrhythmia. Syncopal episode likely 2/2 dehydration in the setting of influenza - Check orthostatic vital signs   2. Intermittent exertional chest pressure: patient reports occasional episodes of L sided chest pressure typically with exertion which is relieved  with rest after 1-2 minutes. EKG with chronic LBBB. Troponin negative x1 3/17. Last cardiac cath 2014 with minimal nonobstructive CAD. Last NST 11/2016 with defect c/w LBBB artifact, otherwise no reversible ischemia.  - Echo pending to assess LV function and wall motion abnormalities - Can consider further ischemic evaluation after resolution of present illness  3. Chronic systolic heart failure: appears euvolemic on exam.  - Agree with lower metoprolol dose in the setting of acute illness. Resume home dose when BP improved. -  Continue to hold spironolactone, losartan, and lasix for now. Resume when BP improved. Consider alternate ARB given recalls and intolerance to ACEi - Echocardiogram pending  4. NICM: s/p [employer] ICD. Interrogation 3/20 without arrhythmia. - Report placed in paper chart  5. Influenza A in patient with history of asthma: initially diagnosed 3/17. Now on tamiflu to complete a 5 day course and prednisone.  - Supportive care with cough suppressants and nebulizers - Continue management per primary team   6. Parkinson's Disease: missed several doses of sinemet. Likely contributed to frequent falls over the past several days.  - Continue management per primary team  7. DM type 2: A1C 6.7 - at goal of <7 - Continue management per primary team  8. Acute on chronic kidney disease stage III: Cr. 1.61 (10/15/17)>1.24 today. Improved with IVF. Baseline Cr 1.1 - Continue to monitor Cr closely   For questions or updates, please contact [place] Please consult [email or web address] for contact info under Cardiology/STEMI.   Signed, Abigail Butts, PA-C  10/18/2017 1:08 PM [id number] As above, patient seen and examined.  Briefly she is a 67 year old female with past medical history of nonischemic cardiomyopathy, prior ICD, diabetes mellitus, asthma for evaluation of syncope.  Patient has chronic dyspnea on exertion and occasional orthopnea by her report.  There is no pedal edema and no exertional chest pain.  Some pain with cough.  Last Friday she developed flulike symptoms including fevers, chills, productive cough, general malaise and diarrhea.  She had decreased appetite.  She then developed worsening dizziness with standing followed by syncope.  She states she rose from the bed to walk to the bathroom and became lightheaded followed by frank syncope.  No preceding palpitations or chest pain.  She was admitted and cardiology asked to evaluate.  She has been diagnosed with influenza A.   Electrocardiogram shows sinus rhythm with left bundle branch block. 1 syncope-history is consistent with orthostatic syncope.  This is likely exacerbated by recent influenza with diarrhea and decreased oral intake.  I agree with holding ARB and diuretics for now.  Her symptoms should improve as she recovers from influenza.  Note her device was interrogated and there were no arrhythmias.  2 nonischemic cardiomyopathy-continue preadmission dose of beta-blocker.  Resume ARB and Spironolactone at discharge.  If she has persistent presyncopal episodes with standing would consider decreasing Toprol.  3 chronic systolic congestive heart failure-patient has orthostatic symptoms as outlined.  Would hold diuretics until discharge and then resume.  4 Prior ICD  We will sign off.  Please call with questions.  Kirk Ruths, MD

## 2017-10-19 DIAGNOSIS — R4189 Other symptoms and signs involving cognitive functions and awareness: Secondary | ICD-10-CM

## 2017-10-19 DIAGNOSIS — R55 Syncope and collapse: Secondary | ICD-10-CM

## 2017-10-19 HISTORY — DX: Other symptoms and signs involving cognitive functions and awareness: R41.89

## 2017-10-19 LAB — CBC
HCT: 36.5 % (ref 36.0–46.0)
Hemoglobin: 12.2 g/dL (ref 12.0–15.0)
MCH: 29.9 pg (ref 26.0–34.0)
MCHC: 33.4 g/dL (ref 30.0–36.0)
MCV: 89.5 fL (ref 78.0–100.0)
Platelets: 188 10*3/uL (ref 150–400)
RBC: 4.08 MIL/uL (ref 3.87–5.11)
RDW: 12.4 % (ref 11.5–15.5)
WBC: 4.5 10*3/uL (ref 4.0–10.5)

## 2017-10-19 LAB — BASIC METABOLIC PANEL
Anion gap: 11 (ref 5–15)
BUN: 18 mg/dL (ref 6–20)
CO2: 25 mmol/L (ref 22–32)
Calcium: 9.5 mg/dL (ref 8.9–10.3)
Chloride: 103 mmol/L (ref 101–111)
Creatinine, Ser: 1.16 mg/dL — ABNORMAL HIGH (ref 0.44–1.00)
GFR calc Af Amer: 55 mL/min — ABNORMAL LOW (ref >60)
GFR calc non Af Amer: 47 mL/min — ABNORMAL LOW (ref >60)
Glucose, Bld: 121 mg/dL — ABNORMAL HIGH (ref 65–99)
Potassium: 4.1 mmol/L (ref 3.5–5.1)
Sodium: 139 mmol/L (ref 135–145)

## 2017-10-19 LAB — RPR: RPR Ser Ql: NONREACTIVE

## 2017-10-19 LAB — GLUCOSE, CAPILLARY
Glucose-Capillary: 135 mg/dL — ABNORMAL HIGH (ref 65–99)
Glucose-Capillary: 140 mg/dL — ABNORMAL HIGH (ref 65–99)
Glucose-Capillary: 205 mg/dL — ABNORMAL HIGH (ref 65–99)

## 2017-10-19 LAB — C DIFFICILE QUICK SCREEN W PCR REFLEX
C Diff antigen: NEGATIVE
C Diff interpretation: NOT DETECTED
C Diff toxin: NEGATIVE

## 2017-10-19 LAB — VITAMIN B12: Vitamin B-12: 784 pg/mL (ref 180–914)

## 2017-10-19 LAB — TSH: TSH: 0.458 u[IU]/mL (ref 0.350–4.500)

## 2017-10-19 MED ORDER — OSELTAMIVIR PHOSPHATE 30 MG PO CAPS: 30.0000 mg | ORAL_CAPSULE | Freq: Two times a day (BID) | ORAL | 0 refills | Status: AC

## 2017-10-19 MED ORDER — PREDNISONE 50 MG PO TABS: 50.0000 mg | ORAL_TABLET | Freq: Every day | ORAL | 0 refills | Status: AC

## 2017-10-19 NOTE — Progress Notes (Addendum)
Occupational Therapy Treatment Patient Details Name: Veronica Shaw MRN: [id number] DOB: [birth year] Today's Date: 10/19/2017    History of present illness Pt is a 67 y/o female with a PMH significant for asthma, HF, DMII, HTN, Parkinson's Disease. Pt presents with syncope and several falls at home - she tested positive for influenza A prior to ED visit (same day), and was found to be dehydrated. CT head negative for acute changes.    OT comments  Pt making progress with functional goals.  Pt's husband and grand daughter feel she needs a Marketing executive, however agree with previous therapist that continued use of cane for safety is ok and with rollater if feeling more unsteady than usual. Explained difference of use cane vs 4W rollater vs 3W rollater. Family states that they may still go buy one   Follow Up Recommendations  Supervision/Assistance - 24 hour    Equipment Recommendations    none   Recommendations for Other Services      Precautions / Restrictions Precautions Precautions: Fall Precaution Comments: History of Parkinson's Restrictions Weight Bearing Restrictions: No       Mobility Bed Mobility Overal bed mobility: Independent                Transfers Overall transfer level: Needs assistance Equipment used: Straight cane Transfers: Sit to/from Stand Sit to Stand: Min guard;Supervision         General transfer comment: for safety    Balance Overall balance assessment: History of Falls                                         ADL either performed or assessed with clinical judgement   ADL Overall ADL's : Needs assistance/impaired                         Toilet Transfer: Supervision/safety;Min guard;Ambulation;Grab bars;Comfort height toilet(used cane)   Toileting- Clothing Manipulation and Hygiene: Min guard;Sit to/from stand;Supervision/safety         General ADL Comments: pt min guard A - set up/sup with ADLs due to a little  bit of unsteadiness. Pt has tub bench at home that she uses during showering     Vision Baseline Vision/History: Wears glasses Patient Visual Report: No change from baseline     Perception     Praxis      Cognition Arousal/Alertness: Awake/alert Behavior During Therapy: WFL for tasks assessed/performed Overall Cognitive Status: Within Functional Limits for tasks assessed                                          Exercises     Shoulder Instructions       General Comments      Pertinent Vitals/ Pain       Pain Assessment: No/denies pain Faces Pain Scale: No hurt  Home Living                                          Prior Functioning/Environment              Frequency  Min 2X/week        Progress Toward Goals  OT Goals(current goals  can now be found in the care plan section)  Progress towards OT goals: Progressing toward goals     Plan Discharge plan remains appropriate    Co-evaluation                 AM-PAC PT "6 Clicks" Daily Activity     Outcome Measure   Help from another person eating meals?: None Help from another person taking care of personal grooming?: A Little Help from another person toileting, which includes using toliet, bedpan, or urinal?: A Little Help from another person bathing (including washing, rinsing, drying)?: A Little Help from another person to put on and taking off regular upper body clothing?: A Little Help from another person to put on and taking off regular lower body clothing?: A Little 6 Click Score: 19    End of Session    OT Visit Diagnosis: Unsteadiness on feet (R26.81)   Activity Tolerance Patient tolerated treatment well   Patient Left in bed;with call bell/phone within reach;with family/visitor present   Nurse Communication      Functional Assessment Tool Used: AM-PAC 6 Clicks Daily Activity   Time: 1325-1350 OT Time Calculation (min): 25 min  Charges: OT  G-codes **NOT FOR INPATIENT CLASS** Functional Assessment Tool Used: AM-PAC 6 Clicks Daily Activity OT General Charges $OT Visit: 1 Visit OT Treatments $Self Care/Home Management : 8-22 mins $Therapeutic Activity: 8-22 mins     Emmit Alexanders [facility] 10/19/2017, 2:04 PM

## 2017-10-19 NOTE — Progress Notes (Signed)
Patient and family educated on discharge information and follow up instructions. Verbalize understanding and able to teach back. No new concerns. Discharge summary signed and in chart. IV removed.  Patient discharged from unit by staff in wheelchair. Spouse to transfer home.

## 2017-10-19 NOTE — Progress Notes (Signed)
Pt's bed alarm activated due to pt's fall risk, education given prior to activation, pt later got up without calling for help and the alarm went off, pt got upset saying why was the alarm on when it was on all day, this RN told pt that it was her choice to make, if she refused the alarm, I cannot force her, will only document that she refused the alarm after safety education given, pt decline the alarm being set, was reassured and will continue to monitor. Obasogie-Asidi, Kemper Heupel Efe

## 2017-10-19 NOTE — Progress Notes (Signed)
Family Medicine Teaching Service Daily Progress Note Intern Pager: [phone]  Patient name: Veronica Shaw Medical record number: [id number] Date of birth: 1950-12-26 Age: 67 y.o. Gender: female  Primary Care Provider: Rogue Bussing, MD Consultants: None Code Status: Full  Pt Overview and Major Events to Date:  Veronica Shaw is a 67 y.o. female presenting with syncope. PMH is significant for asthma, HFrEF 30-35% DM2, HTN, HLD, and Parkinson's.  Assessment and Plan:  Syncope/Dehydration 2/2 influenza Likely due to dehydration/weakness from influenza A diagnosed on prior ED visit 10/17/17. She has had several recent falls from weakness and fatigue, most recent was last night where she was down for unknown length of time and had hit her head but mentating well on admission.  Patient on RA and feeling better today. -tamiflu x 5d since hospitalized although patient is >48h from onset -prednisone 50 x5days -albuterol q2 prn -duoneb Q4h scheduled -droplet precautions -cardiology has seen and she had no arrhythmia  -MOCA testing, RPR pending  HFrEF-euvolemic on exam, not in acute exacerbation. BP soft on admit 90/50s. S/p 1L bolus NS -metoprolol XL 50 mg BID -holding home spironolactone, losartan and lasix given soft Bps -Repeat ECHO EF 60-65% with G2DD  DM2 controlled. A1c 6.7. At home on metformin 1000mg  BID -holding home metformin for slight AKI -sSSI - monitor CBGs  Asthma: with mild wheezing on exam from influenza A. At home on symbicort 2 puffs BID which patient is compliant on. -albuterol q2prn as above -duonebs q4 as above -dulera while admitted  Parkinsons: Recently started on sinemet but missed last 3 days and has h/o medication noncompliance -restart home sinemet  FEN/GI: heart/carb diet- phenergan-pepcid Prophylaxis: lovenox  Disposition: medically stable for discharge  Subjective:  Patient is feeling much better today.  Patient states that she is ready  to go home.    Objective: Temp:  [98.2 F (36.8 C)-98.7 F (37.1 C)] 98.2 F (36.8 C) (03/21 0354) Pulse Rate:  [77-91] 83 (03/21 0354) Resp:  [18] 18 (03/21 0354) BP: (102-138)/(45-65) 138/65 (03/21 0354) SpO2:  [94 %-99 %] 94 % (03/21 0354) Physical Exam: General: NAD, pleasant ENTM: Moist mucous membranes Cardiovascular: RRR, no m/r/g, no LE edema Respiratory: CTA BL, normal work of breathing On room air motion Gastrointestinal: soft, nontender, nondistended, normoactive BS MSK: moves 4 extremities equally Derm: no rashes appreciated Psych: Appropriate affect  Laboratory: Recent Labs  Lab 10/17/17 1215 10/18/17 0521 10/19/17 0712  WBC 4.7 3.1* 4.5  HGB 12.1 11.5* 12.2  HCT 37.2 35.6* 36.5  PLT 175 164 188   Recent Labs  Lab 10/17/17 1215 10/18/17 0521 10/19/17 0712  NA 135 139 139  K 4.8 4.4 4.1  CL 105 106 103  CO2 20* 24 25  BUN 23* 17 18  CREATININE 1.46* 1.24* 1.16*  CALCIUM 9.1 9.0 9.5  GLUCOSE 117* 100* 121*   Hgb A1c 6.7 CK 164 TSH 0.458 Vitamin B12 784   Imaging/Diagnostic Tests: ECHO: Study Conclusions - Left ventricle: The cavity size was normal. Wall thickness was normal. Systolic function was normal. The estimated ejection fraction was in the range of 60% to 65%. Wall motion was normal; there were no regional wall motion abnormalities. Features are consistent with a pseudonormal left ventricular filling pattern, with concomitant abnormal relaxation and increased filling pressure (grade 2 diastolic dysfunction).  Azam Gervasi, [place], DO 10/19/2017, 8:21 AM PGY-1, [place] Intern pager: [phone], text pages welcome

## 2017-10-19 NOTE — Evaluation (Signed)
Speech Language Pathology Evaluation Patient Details Name: Veronica Shaw MRN: [id number] DOB: [birth year] Today's Date: 10/19/2017 Time: 8502-7741 SLP Time Calculation (min) (ACUTE ONLY): 15 min  Problem List:  Patient Active Problem List   Diagnosis Date Noted  . Asthma with COPD with exacerbation ([place])   . Multiple falls   . Flu 10/17/2017  . Asthma exacerbation 08/03/2017  . Dense breast tissue on mammogram 05/30/2017  . Hair loss 07/21/2016  . Middle ear effusion, bilateral 04/28/2016  . Suprapubic pain 04/14/2016  . Allergic rhinitis 11/09/2015  . Upper airway cough syndrome 08/20/2015  . Osteopenia 07/10/2015  . Mild persistent asthma in adult without complication 28/78/6767  . Bunion, left foot 05/20/2014  . NICM (nonischemic cardiomyopathy) ([place]) 04/23/2014  . Arthritis or polyarthritis, rheumatoid ([place]) 03/12/2014  . ICD (implantable cardioverter-defibrillator), dual, in situ 03/06/2014  . Thoracic or lumbosacral neuritis or radiculitis, unspecified 07/03/2013  . Chronic systolic heart failure ([place]) 05/31/2013  . Parkinson disease ([place]) 05/21/2013  . 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 Major depression 03/31/2013  . Balance problem 03/03/2013  . Lower extremity edema 03/03/2013  . GERD (gastroesophageal reflux disease) 09/27/2012  . Blurry vision, bilateral 09/27/2012  . Dyspnea 09/19/2012  . Plantar fasciitis, bilateral 06/15/2012  . Cervical pain (neck) 04/12/2012  . 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 Diabetic peripheral neuropathy ([place]) 12/09/2011  . Hyperlipidemia 12/09/2011  . Sleep apnea 12/09/2011  . Fatigue 10/13/2011  . Chronic urticaria 05/27/2011  . 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 05/20/2011  . Sciatic leg pain 07/22/2009  . ROSACEA 05/29/2009  . ACHILLES BURSITIS OR TENDINITIS 03/27/2009  . Type II diabetes mellitus with complication ([place]) 20/94/7096  . Essential hypertension 09/28/2006   Past Medical History:  Past Medical History:   Diagnosis Date  . Asthma   . Chronic systolic CHF (congestive heart failure) ([place])    a. cMRI 4/15: EF 34% and findings - c/w NICM, normal RV size and function (RVEF 61%), Mild MR // b. Echo 2/15:  EF 30-35%, diff HK, ant-sept AK, Gr 2 DD, mild MR, trivial TR  //  c. Echo 5/17: EF 20-25%, severe diffuse HK, marked systolic dyssynchrony, grade 1 diastolic dysfunction, mild MR  //  d. RHC 5/17: Fick CO 2.9, RVSP 19, PASP 15, PW mean 2, low filing pressures and preserved CO   . Diabetes mellitus   . Gastritis   . History of echocardiogram    Echo 6/18: EF 30-35, diffuse HK, grade 1 diastolic dysfunction, trivial MR, mild LAE, mild TR, no pericardial effusion  . History of nuclear stress test    Myoview 5/18: EF 49, no ischemia, inferoseptal defect c/w LBBB artifact (intermediate risk due to EF < 50).  [name] Kitchen HTN (hypertension)   . Hyperlipidemia   . NICM (nonischemic cardiomyopathy) ([place])    a. Nuclear 5/13: Normal stress nuclear study. LV Ejection Fraction: 58%  //  b. LHC 10/14: Minor luminal irregularity in prox LAD, EF 35%   . Parkinson disease ([place])   . Plantar fasciitis   . Sleep apnea    was retested and no longer had it and so d/c CPAP  . Urticaria    Past Surgical History:  Past Surgical History:  Procedure Laterality Date  . BUNIONECTOMY    . CARDIAC CATHETERIZATION N/A 12/16/2015   Procedure: Right Heart Cath;  Surgeon: Sherren Mocha, MD;  Location: [facility] CV LAB;  Service: Cardiovascular;  Laterality: N/A;  . CHOLECYSTECTOMY    . IMPLANTABLE CARDIOVERTER DEFIBRILLATOR IMPLANT  11-25-13  MDT dual chamber ICD implanted by [name] for primary prevention  . IMPLANTABLE CARDIOVERTER DEFIBRILLATOR IMPLANT N/A 11/25/2013   Procedure: IMPLANTABLE CARDIOVERTER DEFIBRILLATOR IMPLANT;  Surgeon: [name], MD;  Location: [facility] CATH LAB;  Service: Cardiovascular;  Laterality: N/A;  . LEFT AND RIGHT HEART CATHETERIZATION WITH CORONARY ANGIOGRAM N/A 05/31/2013   Procedure: LEFT AND  RIGHT HEART CATHETERIZATION WITH CORONARY ANGIOGRAM;  Surgeon: [name], MD;  Location[facility] CATH LAB;  Service: Cardiovascular;  Laterality: N/A;  . TONSILLECTOMY    . TUBAL LIGATION     HPI:  Veronica Shaw a 67 y.o.femalepresenting with syncope/dehydration secondary to influenza. PMH is significant forasthma, HFrEF 30-35% DM2, HTN, HLD, and Parkinson's. Head CT completed (3/19) revealing no evidence of acute intracranial abnormality and mild chronic small vessel ischemic disease.   Assessment / Plan / Recommendation Clinical Impression    MOCA was administered with a score of 23/30. Pt presents with Mild-Mod cognitive deficits in the areas of mathematical reasoning, memory, reasoning, and thought organization/planning. Memory deficits were characterized as a deficit in new information storage more than recall impairment. Pt required encouragement throughout assessment and benefited from [place] semantic cues to aid in reasoning and  verbal fluency tasks. Pt reported that thinking/processing seemed different, saying that thoughts were difficult to process during tasks. Further assessment and intervention for cognitive deficits identified recommended in next venue of care.    SLP Assessment  SLP Recommendation/Assessment: All further Speech Lanaguage Pathology  needs can be addressed in the next venue of care SLP Visit Diagnosis: Cognitive communication deficit (R41.841)    Follow Up Recommendations  Outpatient SLP    Frequency and Duration           SLP Evaluation Cognition  Overall Cognitive Status: Impaired/Different from baseline Arousal/Alertness: Awake/alert Orientation Level: Oriented X4 Attention: Sustained Sustained Attention: Appears intact Memory: Impaired Memory Impairment: Decreased recall of new information Awareness: Appears intact Problem Solving: Impaired Problem Solving Impairment: Verbal basic Executive Function: Decision Making;Organizing Organizing:  Impaired Organizing Impairment: Verbal basic Decision Making: Impaired Decision Making Impairment: Verbal basic       Comprehension  Auditory Comprehension Overall Auditory Comprehension: Appears within functional limits for tasks assessed Visual Recognition/Discrimination Discrimination: Not tested Reading Comprehension Reading Status: Not tested    Expression Expression Primary Mode of Expression: Verbal Verbal Expression Overall Verbal Expression: Appears within functional limits for tasks assessed Written Expression Written Expression: Not tested   Oral / Motor  Motor Speech Overall Motor Speech: Appears within functional limits for tasks assessed   GO                    [place] Nadirah Socorro 10/19/2017, 2:50 PM

## 2017-10-19 NOTE — Consult Note (Signed)
            [facility] CM Primary Care Navigator  10/19/2017  Veronica Shaw 1951-02-05 [id number]  Seen patient and husband Francee Piccolo) at the bedside to identify possible discharge needs. Patient reports having"productive cough, SOB, wheezing, fever, headache, diarrhea, nausea/ vomiting" and was diagnosed with influenza A which led to this admission.  PatientendorsesDr.Hillary M.Ola Spurr with [place] as theprimary care provider.   Patient shared [place] [facility] Order service to obtain medications without difficulty so far.   Patientreportsthat she has been managinghermedications at home using "pill box" system filled weekly.  Patient verbalized that she was driving prior to admission but husband can provide transportation to her doctors' appointments after discharge.    Patient lives at home with husband who will serve as her primary [place] discharged as stated.   Anticipated discharge plan is homeaccording topatient with recommended Outpatient rehab per therapy.   Patient voiced understanding to call primary care provider's office when she returns home, for a post discharge follow-up appointment within1- 2 weeksor sooner if needs arise. Patient letter (with PCP's contact number) provided as a reminder.   Patient has medical history significant for asthma, combined asthma/COPD (never smoked but big exposure to passive smoke as a child), chronic systolic congestive HF, DM II, HTN, HLD, parkinson's disease. She is aware of ways to manage DM and asthma/ COPD but verbalized lack of knowledge with HF management like:signs and symptomsofHFthatwill need medical assistance, HF zones/ tool and unsure of diet restriction. She reports havingaweighing scale but not consistently monitoring/ recording her weight. Patient also has a history of medication non-compliance with some medications.  Explained  to patient about [facility] care management services available for her and she voiced interest, but prefers to have phone calls. She verbally agreed and opted for referralto [facility] Telephonic care managementafter dischargeforfollow-up of needs, provide education and reinforce disease management of chronic health issues.  Referral made to [place] care managementcoordinator after dischargeto provide further information/ education on ways to manage HF and reinforce disease management of other chronic health illnesses.  THNcare managementinformation provided for future needs that may arise.   For additional questions please contact:  Edwena Felty A. Kollen Armenti, BSN, RN-BC [facility] PRIMARY CARE Navigator Cell: [phone]

## 2017-10-23 ENCOUNTER — Other Ambulatory Visit: Payer: Self-pay | Admitting: *Deleted

## 2017-10-23 NOTE — Patient Outreach (Signed)
[place] [facility]) Care Management  10/23/2017  Veronica Shaw Aug 29, 1950 [id number]   Initial telephone screening call  Referral date: 3/21 Referral source: [facility] Primary care navigator Referral reason : Telephonic education on Heart failure management .  Insurance : Medicare     Unsuccessful telephone call to patient, able to leave a HIPAA compliant message for return call.   Plan RN will plan return call on next day, if no response today.   Joylene Draft, RN, [place] Management Coordinator  [phone]- Mobile [phone]- Toll Free Main Office

## 2017-10-24 ENCOUNTER — Other Ambulatory Visit: Payer: Self-pay | Admitting: *Deleted

## 2017-10-24 NOTE — Patient Outreach (Addendum)
[facility]) Care Management  10/24/2017  Veronica Shaw [birth year] [id number]  Telephone screening attempt #2  Referral date: 3/21 Referral source: [facility] Primary care navigator Referral reason : Telephonic education on Heart failure management .  Insurance : Medicare    Placed call to patient, explained reason for the call, HIPAA information verified.  Patient states she is resting and  not feeling well, request return call at a later day.  Plan Will plan return call in the next day,   Joylene Draft, RN, [place] Management Coordinator  [phone]- Mobile [phone]- [place]

## 2017-10-25 ENCOUNTER — Other Ambulatory Visit: Payer: Self-pay | Admitting: *Deleted

## 2017-10-25 NOTE — Patient Outreach (Signed)
[facility]) Care Management  10/25/2017  Veronica Shaw Feb 03, 1951 [id number]   Telephone screening   Placed additional follow up call to patient , as she did feel like talking on yesterday at 2nd call attempt.  No answer to phone call placed on today, able to leave a HIPAA compatible message for return call   Plan RNCM will send outreach letter and schedule follow up call after 10 business days.    Joylene Draft, RN, [place] Management Coordinator  [phone]- Mobile [phone]- Toll Free Main Office

## 2017-10-27 ENCOUNTER — Encounter: Payer: Self-pay | Admitting: Internal Medicine

## 2017-10-27 ENCOUNTER — Ambulatory Visit (INDEPENDENT_AMBULATORY_CARE_PROVIDER_SITE_OTHER): Payer: Medicare Other | Admitting: Internal Medicine

## 2017-10-27 ENCOUNTER — Other Ambulatory Visit: Payer: Self-pay

## 2017-10-27 DIAGNOSIS — R05 Cough: Secondary | ICD-10-CM | POA: Diagnosis not present

## 2017-10-27 DIAGNOSIS — R55 Syncope and collapse: Secondary | ICD-10-CM

## 2017-10-27 DIAGNOSIS — R059 Cough, unspecified: Secondary | ICD-10-CM

## 2017-10-27 MED ORDER — BENZONATATE 200 MG PO CAPS: 200.0000 mg | ORAL_CAPSULE | Freq: Three times a day (TID) | ORAL | 0 refills | Status: DC | PRN

## 2017-10-27 NOTE — Assessment & Plan Note (Signed)
Multiple possible etiologies, including recent influenza diagnosis, history of asthma and history of COPD. Lungs CTAB and patient with normal WOB on RA. O2 sat WNL as well. Discussed that it is illegal for me to prescribe medications for patient to be used by her husband, and if he has symptoms he is concerned about, he should be seen by his doctor. Also explained that a year's worth of cough medication is not appropriate, and if she has symptoms lasting that long, she should follow up. Will prescribe Tessalon perles 200mg  TID #30. Also discussed conservative measurements such as honey and Cepacol lozenges. Return if no improvement.

## 2017-10-27 NOTE — Progress Notes (Signed)
   Subjective:   Patient: Veronica Shaw       Birthdate: 09-29-1950       MRN: [id number]      HPI  Veronica Shaw is a 67 y.o. female presenting for hosp f/u.   Patient admitted from 03/19-03/21. Presented with syncope, and was subsequently noted to have dehydration 2/2 influenza. She was started on Tamiflu and prednisone, and improved significantly during admission.  Since discharge, patient says she has been feeling much better. Denies any additional syncopal episodes. Says that she has some nausea in the mornings, but this is not new. Endorses decreased appetite, but thinks this is just her readjusting to being out of the hospital. Also endorses sore neck that she attributes to sleeping in hospital bed.  Does endorse cough. Is requesting codeine. Asking for a year long prescription so she can share it with her husband. Denies difficulty sleeping at night but says her husband has been having trouble sleeping.   Smoking status reviewed. Patient is never smoker.   Review of Systems See HPI.     Objective:  Physical Exam  Constitutional: She is oriented to person, place, and time and well-developed, well-nourished, and in no distress.  HENT:  Head: Normocephalic and atraumatic.  Mouth/Throat: Oropharynx is clear and moist. No oropharyngeal exudate.  Eyes: Pupils are equal, round, and reactive to light. Conjunctivae and EOM are normal. Right eye exhibits no discharge. Left eye exhibits no discharge.  Cardiovascular: Normal rate, regular rhythm and normal heart sounds.  No murmur heard. Pulmonary/Chest: Effort normal. No respiratory distress.  Neurological: She is alert and oriented to person, place, and time.  Skin: Skin is warm and dry.  Psychiatric: Affect and judgment normal.      Assessment & Plan:  Syncope No additional episodes since hospital discharge. Doing well.   Cough Multiple possible etiologies, including recent influenza diagnosis, history of asthma and history of COPD.  Lungs CTAB and patient with normal WOB on RA. O2 sat WNL as well. Discussed that it is illegal for me to prescribe medications for patient to be used by her husband, and if he has symptoms he is concerned about, he should be seen by his doctor. Also explained that a year's worth of cough medication is not appropriate, and if she has symptoms lasting that long, she should follow up. Will prescribe Tessalon perles 200mg  TID #30. Also discussed conservative measurements such as honey and Cepacol lozenges. Return if no improvement.    Adin Hector, MD, MPH PGY-3 [place] [facility] Pager [phone]

## 2017-10-27 NOTE — Patient Instructions (Signed)
It was nice meeting you today [name]!  I am glad you are feeling better! You can take one Tessalon perle up to every 8 hours as needed for cough. You can also have one teaspoonful of honey, either alone or mixed into a warm beverage, 3-4 times per day to help with cough. Cepacol throat lozenges or Chloraseptic throat spray from the grocery or drugstore can also be very helpful for cough.   Please continue taking all of your other medications as you have been.   If you have any questions or concerns, please feel free to call the clinic.   Be well,  Dr. Avon Gully

## 2017-10-27 NOTE — Assessment & Plan Note (Signed)
No additional episodes since hospital discharge. Doing well.

## 2017-10-30 ENCOUNTER — Ambulatory Visit (INDEPENDENT_AMBULATORY_CARE_PROVIDER_SITE_OTHER): Payer: Medicare Other

## 2017-10-30 DIAGNOSIS — Z95810 Presence of automatic (implantable) cardiac defibrillator: Secondary | ICD-10-CM | POA: Diagnosis not present

## 2017-10-30 DIAGNOSIS — I5022 Chronic systolic (congestive) heart failure: Secondary | ICD-10-CM

## 2017-10-31 ENCOUNTER — Encounter: Payer: Self-pay | Admitting: *Deleted

## 2017-10-31 NOTE — Progress Notes (Signed)
EPIC Encounter for ICM Monitoring  Patient Name: Veronica Shaw is a 67 y.o. female Date: 10/31/2017 Primary Care Physican: Rogue Bussing, MD Primary Cardiologist:Cooper Electrophysiologist: Lovena Le Dry Weight:133lbs      Pt still feels like she is trying to get over the flu.  She has a cough and some chest discomfort related to her breathing. She has been drinking more fluids as advised by hospitalist due to flu and also received IV fluids during her hospital visits. She had more Parkinson's symptoms. ER visit on 10/15/2017 and diagnosed with flu.  Hospitalization 3/19 - 3/21 for syncope   Thoracic impedance abnormal suggesting fluid accumulation since 10/09/2017.  Prescribed dosage: Furosemide 20 mg 1 tablet daily as needed. Potassium 10 mEq 1 tablet daily as needed (taken with Lasix).   Labs: 10/19/2017 Creatinine 1.16, BUN 18, Potassium 4.1, Sodium 139, EGFR 47-55 10/18/2017 Creatinine 1.24, BUN 17, Potassium 4.4, Sodium 139, EGFR 44-51  10/17/2017 Creatinine 1.46, BUN 23, Potassium 4.8, Sodium 135, EGFR 36-41  10/15/2017 Creatinine 1.61, BUN 24, Potassium 4.0, Sodium 133, EGFR 32-37 04/07/2017 Creatinine 1.18, BUN 23, Potassium 4.1, Sodium 142, EGFR 46-54 04/04/2017 Creatinine 1.04, BUN 21, Potassium 4.6, Sodium 143, EGFR 55-64 01/25/2017 Creatinine 1.17, BUN 22, Potassium 4.5, Sodium 142, EGFR 48-56 12/28/2016 Creatinine 1.05, BUN 20, Potassium 4.2, Sodium 143, EGFR 55-64, NT-Pro BNP 244 12/21/2016 Creatinine 1.33, BUN 24, Potassium 5.1, Sodium 140 07/21/2016 Creatinine 1.18, BUN 17, Potassium 4.1, Sodium 139, EGFR 48-55  Recommendations: Advised to take Furosemide 20 mg 1 tablet daily x 3 days but if she has any dizziness or lightheadedness then go back to PRN. Advised to call back for any problems or the ER if needed.  Follow-up plan: ICM clinic phone appointment on 11/06/2017 to recheck fluid levels.    Copy of ICM check sent to Dr. Caryl Comes and Dr. Burt Knack.   3 month  ICM trend: 10/31/2017    1 Year ICM trend:       [name], RN 10/31/2017 10:56 AM

## 2017-11-03 ENCOUNTER — Ambulatory Visit: Payer: Self-pay | Admitting: *Deleted

## 2017-11-03 ENCOUNTER — Other Ambulatory Visit: Payer: Self-pay | Admitting: *Deleted

## 2017-11-03 NOTE — Patient Outreach (Signed)
[facility]) Care Management  11/03/2017  Veronica Shaw 12/04/1950 [id number]  Covering for Landis Martins, RN   Discharged 3/21and today is the 3rd outreach attempt for SCREENING  RN spoke with pt today and introduced the [facility] program and purpose for today's call. RN spoke with pt and inquired if this was a good time to discussed possible services (pt agreed). RN explained services and inquired further on her recent discharge on 3/21. Pt states she had the FLU and has been taking all her medications as instructed. Pt also reported attending all medical appointments as scheduled and completed her primary care visit on 3/29. States she has a supportive family  Who has provided her with a 3-in-1 commode at the bedside for immediate relief if needed. RN offered to review all medication via transition of care template however pt did not have her discharge sheet on hand at this time. On an outings picking up her grandson.  Offered to call back however pt indicated she does not have any needs for [facility] services at this time. RN inquires further on any other medical condition as pt states she weighs and manages her HF well now that she has a defibrillator no other issues or events. States her only concern at this time is her Parkinson's. Pt is aware of the medication prscribed for this medical condition and continues to receive feedback from her provider. Some side affect from the existing medication as her doctor is aware and pt continues to take his medication over the last month with no additional side affects. RN offered community resources at this time, none needed. Will close case as non-active at this time based upon pt's response and inform pt to that she may contact the [facility] office at anytime for further services and verified pt has a contact number.  Raina Mina, RN Care Management Coordinator [place] Office [phone]

## 2017-11-06 ENCOUNTER — Other Ambulatory Visit: Payer: Self-pay | Admitting: *Deleted

## 2017-11-06 ENCOUNTER — Ambulatory Visit (INDEPENDENT_AMBULATORY_CARE_PROVIDER_SITE_OTHER): Payer: Self-pay

## 2017-11-06 DIAGNOSIS — Z95810 Presence of automatic (implantable) cardiac defibrillator: Secondary | ICD-10-CM

## 2017-11-06 DIAGNOSIS — I5022 Chronic systolic (congestive) heart failure: Secondary | ICD-10-CM

## 2017-11-06 NOTE — Patient Outreach (Signed)
[facility]) Care Management  11/06/2017  Veronica Shaw 08-Oct-1950 [id number]   Coverage for Veronica Martins, RN  RN recieved a call back from a pt that was screened last week who decided she is interested in the [facility] services. Pt left this voice message to RN case manager's phone this morning. Will reported to coverage care team member for Rozetta Nunnery to respond.   Raina Mina, RN Care Management Coordinator [place] Office [phone]

## 2017-11-06 NOTE — Addendum Note (Signed)
Addended by: Raina Mina D on: 11/06/2017 11:24 AM   Modules accepted: Orders

## 2017-11-06 NOTE — Progress Notes (Signed)
EPIC Encounter for ICM Monitoring  Patient Name: TANEAH MASRI is a 67 y.o. female Date: 11/06/2017 Primary Care Physican: Rogue Bussing, MD Primary Cardiologist:Cooper Electrophysiologist: Lovena Le Dry Weight:133lbs      Heart Failure questions reviewed, pt asymptomatic for fluid symptoms. She is very concerned about her BP and her granddaughter that is a nurse has instructed her not to take some of her BP meds if her BP is too low.  Advised to call and make an appointment with Richardson Dopp, PA as recommended at the last office visit with Dr Burt Knack.  She said she took 2 days of PRN furosemide and she was weak.  Explained that was the reason why I gave her instructions at last call that if she became weak or dizzy after taking Furosemide dose to stop with PRN dosage and call back.  She is unsure if her BP device is accurate. Encouraged her to buy a new BP monitor if possible and start taking recording BP daily.  Last BP was 98/48.    Thoracic impedance returned to normal after taking 2 days of PRN Furosemide.   Prescribed dosage: Furosemide 20 mg 1 tablet daily as needed. Potassium 10 mEq 1 tablet daily as needed (taken with Lasix).   Labs: 10/19/2017 Creatinine 1.16, BUN 18, Potassium 4.1, Sodium 139, EGFR 47-55 10/18/2017 Creatinine 1.24, BUN 17, Potassium 4.4, Sodium 139, EGFR 44-51  10/17/2017 Creatinine 1.46, BUN 23, Potassium 4.8, Sodium 135, EGFR 36-41  10/15/2017 Creatinine 1.61, BUN 24, Potassium 4.0, Sodium 133, EGFR 32-37 04/07/2017 Creatinine 1.18, BUN 23, Potassium 4.1, Sodium 142, EGFR 46-54 04/04/2017 Creatinine 1.04, BUN 21, Potassium 4.6, Sodium 143, EGFR 55-64 01/25/2017 Creatinine 1.17, BUN 22, Potassium 4.5, Sodium 142, EGFR 48-56 12/28/2016 Creatinine 1.05, BUN 20, Potassium 4.2, Sodium 143, EGFR 55-64, NT-Pro BNP 244 12/21/2016 Creatinine 1.33, BUN 24, Potassium 5.1, Sodium 140 07/21/2016 Creatinine 1.18, BUN 17, Potassium 4.1, Sodium 139, EGFR  48-55  Recommendations:  Advised to call office to make an appointment with Richardson Dopp, PA.  She is also concerned about her lungs and advised to call pulmonologist to make an appointment.   Follow-up plan: ICM clinic phone appointment on 12/01/2017.    Copy of ICM check sent to Dr. Lovena Le.   3 month ICM trend: 11/06/2017    1 Year ICM trend:       [name], RN 11/06/2017 12:41 PM

## 2017-11-08 ENCOUNTER — Other Ambulatory Visit: Payer: Self-pay

## 2017-11-08 NOTE — Patient Outreach (Signed)
Telephone assessment: Placed call to patient and explained reason for call.  Patient confirmed her identity.  Reports that she is interested in home visit for heart failure education.  Offered home visit for 11/10/2017. Patient has accepted. Confirmed address.  PLAN: initial home visit on 11/10/2017.  Tomasa Rand, RN, BSN, CEN [facility] [employer] [phone]

## 2017-11-10 ENCOUNTER — Other Ambulatory Visit: Payer: Self-pay

## 2017-11-10 NOTE — Patient Outreach (Signed)
[facility]) Care Management   11/10/2017  Veronica Shaw 1951-07-13 [id number]  Veronica Shaw is an 67 y.o. female 10:30 arrived for home visit. Patient sitting at kitchen table Subjective: Patient reports that she has had worsening parkinson's disease, heart failure and falls.  Reports many trips to the emergency department in the last 6 months.  Patient reports that since before admission to the hospital she had low blood pressure and would feel weak. Reports home BP yesterday of 80/40's.  Reports she has good days and bad. Reports weight is stable around 133. States that she weighs daily but does not record. Reports she monitors CBG daily but does not record. States last A1c of 6.7.  Reports she monitors her BP but does not record.  Patient states that she follows her low salt diet and her fluid restriction of 60 oz per day.  Patient reports that she has her medications except Dulera . Reports co pay 400.00 and she can not afford that. Currently patient is using dulera from her inpatient admission.  Patient reports she has good family support from daughter, granddaughter and husband.  Denies any transportation problems.  Currently using a cane for ambulation.   Objective:  Awake and alert. Tremor noted to the right hand and arm.  Ambulating with a cane.  Today's Vitals   11/10/17 1050 11/10/17 1054  BP: 118/60   Pulse: 85   Resp: 20   SpO2: 97%   Weight: 133 lb (60.3 kg)   Height: 1.575 m (5\' 2" )   PainSc:  5    Review of Systems  Constitutional: Positive for malaise/fatigue.  HENT: Negative.   Eyes:       Reports she plans to see eye doctor soon.   Respiratory: Positive for cough and shortness of breath. Negative for sputum production.   Cardiovascular:       Reports chest pain with activity. Reports cardiology is aware. Reports that she has had this for a long time.  Gastrointestinal: Positive for constipation and heartburn.  Genitourinary: Negative.    Musculoskeletal: Positive for falls and joint pain.  Skin: Positive for itching.       Dry and itchy skin.   Neurological: Positive for tingling and tremors.       Tingling in arms, numbness in both hands. Tremors with Parkinsons  Psychiatric/Behavioral: Positive for memory loss. The patient is nervous/anxious and has insomnia.     Physical Exam  Constitutional: She is oriented to person, place, and time. She appears well-developed and well-nourished.  Cardiovascular: Normal rate, normal heart sounds and intact distal pulses.  Respiratory: Effort normal and breath sounds normal.  Lungs clear  GI: Soft. Bowel sounds are normal.  Musculoskeletal: Normal range of motion. She exhibits no edema.  Neurological: She is alert and oriented to person, place, and time.  2019, Trump, April, Friday,  Equal and strong, Strong pedal pushes.  Tremor noted to the right hand and arm.   Skin: Skin is warm and dry.  Feet not examined per patient request, Wearing compression hose.   Psychiatric: She has a normal mood and affect. Her behavior is normal. Judgment and thought content normal.    Encounter Medications:   Outpatient Encounter Medications as of 11/10/2017  Medication Sig Note  . albuterol (PROVENTIL HFA;VENTOLIN HFA) 108 (90 Base) MCG/ACT inhaler Inhale 2 puffs into the lungs every 4 (four) hours as needed for wheezing or shortness of breath.   [name] Kitchen albuterol (PROVENTIL) (2.5 MG/3ML) 0.083% nebulizer  solution Take 3 mLs (2.5 mg total) by nebulization every 6 (six) hours as needed for wheezing or shortness of breath.   . benzonatate (TESSALON) 200 MG capsule Take 1 capsule (200 mg total) by mouth 3 (three) times daily as needed for cough.   . Calcium Carb-Cholecalciferol 600-800 MG-UNIT TABS Take 1 tablet by mouth daily.   . carbidopa-levodopa (SINEMET IR) 25-100 MG tablet Take 1 tablet by mouth 3 (three) times daily.   . furosemide (LASIX) 40 MG tablet Take 0.5 tablets (20 mg total) by mouth daily  as needed for fluid (For swelling).   . gabapentin (NEURONTIN) 100 MG capsule Take 100 mg by mouth daily. Takes as needed.   . metFORMIN (GLUCOPHAGE) 500 MG tablet TAKE 2 TABLETS TWICE DAILY WITH A MEAL (Patient taking differently: TAKE 1000 mg TABLETS TWICE DAILY WITH A MEAL)   . metoprolol succinate (TOPROL-XL) 50 MG 24 hr tablet TAKE 1 AND 1/2 TABLETS DAILY FOR 1 WEEK, THEN INCREASE TO 2 TABLETS DAILY AS DIRECTED (DOSE CHANGE) (Patient taking differently: 50 mg in the morning and 50 mg in the evening)   . potassium chloride (K-DUR,KLOR-CON) 10 MEQ tablet TAKE 1 TABLET DAILY AS NEEDED (TAKE WITH LASIX AS NEEDED FOR SWELLING). (Patient taking differently: TAKE 10 mg  TABLET DAILY AS NEEDED (TAKE WITH LASIX AS NEEDED FOR SWELLING).)   . ranitidine (ZANTAC) 150 MG tablet Take 1 tablet (150 mg total) by mouth 2 (two) times daily. (Patient taking differently: Take 150 mg by mouth as needed. )   . budesonide-formoterol (SYMBICORT) 160-4.5 MCG/ACT inhaler Inhale 2 puffs into the lungs 2 (two) times daily. (Patient not taking: Reported on 11/10/2017)   . losartan (COZAAR) 25 MG tablet Take 1 tablet (25 mg total) by mouth daily. 11/10/2017: Is actively taking this medications  . spironolactone (ALDACTONE) 25 MG tablet Take 0.5 tablets (12.5 mg total) by mouth daily. 11/10/2017: Takes daily   No facility-administered encounter medications on file as of 11/10/2017.     Functional Status:   In your present state of health, do you have any difficulty performing the following activities: 11/10/2017 10/18/2017  Hearing? N N  Vision? Y N  Comment wears glasses.   has cataracts -  Difficulty concentrating or making decisions? Y N  Walking or climbing stairs? Y N  Dressing or bathing? N Y  Doing errands, shopping? Y N  Comment goes with someone -  Conservation officer, nature and eating ? N -  Using the Toilet? Y -  Comment uses a bedside potty at night -  In the past six months, have you accidently leaked urine? Y -  Do you  have problems with loss of bowel control? Y -  Managing your Medications? N -  Managing your Finances? N -  Housekeeping or managing your Housekeeping? N -  Some recent data might be hidden    Fall/Depression Screening:    Fall Risk  11/10/2017 10/09/2017 05/30/2017  Falls in the past year? Yes Yes No  Comment - - -  Number falls in past yr: 2 or more 2 or more -  Injury with Fall? No No -  Comment - - -  Risk Factor Category  High Fall Risk High Fall Risk -  Risk for fall due to : History of fall(s) - -  Follow up Falls evaluation completed Falls evaluation completed -   PHQ 2/9 Scores 11/10/2017 11/03/2017 10/27/2017 05/30/2017 01/18/2017 12/22/2016 11/08/2016  PHQ - 2 Score 0 0 0 0 0 0 0  PHQ- 9 Score - - - - - - -  Exception Documentation - - - - - - -    Assessment:  (1) reviewed [facility] transition of care program.  Reviewed consent. Provided new patient packet.Provided my contact card, [facility] calendar, Heart failure packet, 24 hour nurse line magnet, and low salt poster.  (2) heart failure: weighs daily, aware of zones. Follow low salt diet. (3) Dm: patient reports under good control.  A1c of 6.7.  Monitors daily. (4) reports long standing problem with hypotension. Reports monitored BP daily. No record.  (5) frequent falls. Uses a cane. (6) parkinsons's:  Reports recently started on medications and thinks this is getting worse. Using a cane for ambulation.  (7) no advanced directives.  (8) unable to afford Dulera  Plan:  (1) consent scanned into chart. Will contact patient for transition of care call in 1 week. Next home visit planned for May 6th.  (2) Provided heart failure education packet. Reviewed low salt diet. Reviewed heart failure zones and when to call MD. Encouraged patient to record daily weights. Reviewed locations in [facility] calendar to record weights, CBG and BP readings.  (3) Encouraged patient to record CBG readings daily.  (4) encouraged patient to record BP readings and take  to MD appointments. Reviewed with patient when to call MD For hypotension. (5) Reviewed fall precautions. Encouraged use of assistive devices, non slip sock and not ambulating with dizzy.  (6) Encouraged follow up with MD and reporting any changes in symptoms. (7) Provided advanced directive packet and reviewed with patient how to complete. (8) referral made to [facility] for medication assistance.  Care planning and goal setting during home visit and primary goal is to avoid returning to the hospital.  Goals written down for patient in [facility] calendar. Next contact via phone for transition of care in 1 week. Next home visit planned for May 6th.   This note and barrier letter routed to MD.   [facility] CM Care Plan Problem One     Most Recent Value  Care Plan Problem One  Recent admission for falls and syncopy.  Role Documenting the Problem One  Care Management [place] for Problem One  Active  [facility] Long Term Goal   Patient will report no readmission for falls or hypotension in the next 90 days.   [facility] Long Term Goal Start Date  11/10/17  Interventions for Problem One Long Term Goal  Home visit completed today. Reviewed transition of care. Provided [facility] calandar, nurse line magnet, my contact card. Reviewed discharge instructions.   [facility] CM Short Term Goal #1   Patient will reports weighing and recording weight daily for the next 30 days.   [facility] CM Short Term Goal #1 Start Date  11/10/17  Interventions for Short Term Goal #1  Reviewed importance of recording daily weights. Provided log, Encouraged patient to log to MD appointments. Provided low salt diet poster, Heart failure education booklet.   [facility] CM Short Term Goal #2   Patient will reports monitoring and recording daily BP in [facility] calendar for the next 30 days  [facility] CM Short Term Goal #2 Start Date  11/10/17  Interventions for Short Term Goal #2  Reviewed importance of monitoring and recording daily. Encouraged patient to put readings on  log and take to MD appointments. Reviewed with patient to call MD for low readings.   [facility] CM Short Term Goal #3  Patient will monitor and record CBG readings daily for  the next 30 days.  [facility] CM Short Term Goal #3 Start Date  11/10/17  Interventions for Short Tern Goal #3  Reviewed importance of recording CBG readings and taking log to MD. Reviewed imporantance of taking all medications as prescribed.      Tomasa Rand, RN, BSN, CEN [facility] [employer] [phone]

## 2017-11-13 ENCOUNTER — Ambulatory Visit (INDEPENDENT_AMBULATORY_CARE_PROVIDER_SITE_OTHER): Payer: Medicare Other | Admitting: Adult Health

## 2017-11-13 ENCOUNTER — Encounter: Payer: Self-pay | Admitting: Adult Health

## 2017-11-13 ENCOUNTER — Ambulatory Visit: Payer: Medicare Other | Admitting: Pulmonary Disease

## 2017-11-13 DIAGNOSIS — J309 Allergic rhinitis, unspecified: Secondary | ICD-10-CM

## 2017-11-13 DIAGNOSIS — J4530 Mild persistent asthma, uncomplicated: Secondary | ICD-10-CM | POA: Diagnosis not present

## 2017-11-13 MED ORDER — BUDESONIDE-FORMOTEROL FUMARATE 160-4.5 MCG/ACT IN AERO: 2.0000 | INHALATION_SPRAY | Freq: Two times a day (BID) | RESPIRATORY_TRACT | 0 refills | Status: DC

## 2017-11-13 NOTE — Assessment & Plan Note (Signed)
Controlled on current regimen  Need to check with formulary to pick covered inhaler.   Plan  Patient Instructions  Get in touch with your insurance company to see what is covered on your insurance for your Asthma -  Continue on Dulera 2 puffs Twice daily  , rinse after use.  Follow up with Dr. [place] Bells in 2 months and As needed   Please contact office for sooner follow up if symptoms do not improve or worsen or seek emergency care

## 2017-11-13 NOTE — Progress Notes (Signed)
@Patient  ID: Veronica Shaw, female    DOB: 01/01/1951, 67 y.o.   MRN: [id number]  Chief Complaint  Patient presents with  . Follow-up    asthma     Referring provider: Larey Seat*  HPI: 67 year old female never smoker followed for chronic asthma Past medical history significant for CHF, rheumatoid arthritis, diabetes and Parkinson's disease  11/13/2017 Follow up : Asthma  Patient presents for a 37-month follow-up for asthma.  She remains on Symbicort 2 puffs twice daily. Patient was admitted last month for syncope and dehydration secondary to influenza.  Was treated with supportive care and Tamiflu.  Since discharge patient is feeling much better.  Energy level is improving . [name] Kitchen Breathing is back to baseline .  Insurance is not covering her inhalers.  Denies chest pain, orthopnea, edema.   Allergies  Allergen Reactions  . Aspirin Swelling and Other (See Comments)    Other reaction(s): Other (See Comments) Causes nose bleeds *ONLY THE COATED ASA* Causes nose bleeds *ONLY THE COATED ASA*  . Penicillins Shortness Of Breath and Rash    Shortness of Breath - Throat felt like it was closing.   [name] [Conj Estrog-Medroxyprogest Ace] Shortness Of Breath    Throat swelling Throat swelling  . Ace Inhibitors Cough  . Simvastatin Other (See Comments) and Rash    Muscle aches    Immunization History  Administered Date(s) Administered  . Influenza Split 06/17/2011, 04/18/2012  . Influenza Whole 05/17/2007, 06/14/2010  . Influenza,inj,Quad PF,6+ Mos 05/20/2014, 06/04/2015, 03/22/2016, 05/30/2017  . Influenza,inj,Quad PF,6-35 Mos 05/03/2013  . Pneumococcal Conjugate-13 05/20/2014  . Pneumococcal Polysaccharide-23 05/17/2007, 06/01/2013  . Td 03/01/2006  . Zoster 08/01/2009    Past Medical History:  Diagnosis Date  . Asthma   . Chronic systolic CHF (congestive heart failure) ([place])    a. cMRI 4/15: EF 34% and findings - c/w NICM, normal RV size and function (RVEF  61%), Mild MR // b. Echo 2/15:  EF 30-35%, diff HK, ant-sept AK, Gr 2 DD, mild MR, trivial TR  //  c. Echo 5/17: EF 20-25%, severe diffuse HK, marked systolic dyssynchrony, grade 1 diastolic dysfunction, mild MR  //  d. RHC 5/17: Fick CO 2.9, RVSP 19, PASP 15, PW mean 2, low filing pressures and preserved CO   . Diabetes mellitus   . Gastritis   . History of echocardiogram    Echo 6/18: EF 30-35, diffuse HK, grade 1 diastolic dysfunction, trivial MR, mild LAE, mild TR, no pericardial effusion  . History of nuclear stress test    Myoview 5/18: EF 49, no ischemia, inferoseptal defect c/w LBBB artifact (intermediate risk due to EF < 50).  [name] Kitchen HTN (hypertension)   . Hyperlipidemia   . NICM (nonischemic cardiomyopathy) ([place])    a. Nuclear 5/13: Normal stress nuclear study. LV Ejection Fraction: 58%  //  b. LHC 10/14: Minor luminal irregularity in prox LAD, EF 35%   . Parkinson disease ([place])   . Plantar fasciitis   . Sleep apnea    was retested and no longer had it and so d/c CPAP  . Urticaria     Tobacco History: Social History   Tobacco Use  Smoking Status Never Smoker  Smokeless Tobacco Never Used  Tobacco Comment   exposed to smoke during child hood (parents)   Counseling given: Not Answered Comment: exposed to smoke during child hood (parents)   Outpatient Encounter Medications as of 11/13/2017  Medication Sig  . albuterol (PROVENTIL HFA;VENTOLIN HFA) 108 (  90 Base) MCG/ACT inhaler Inhale 2 puffs into the lungs every 4 (four) hours as needed for wheezing or shortness of breath.  [name] Kitchen albuterol (PROVENTIL) (2.5 MG/3ML) 0.083% nebulizer solution Take 3 mLs (2.5 mg total) by nebulization every 6 (six) hours as needed for wheezing or shortness of breath.  . benzonatate (TESSALON) 200 MG capsule Take 1 capsule (200 mg total) by mouth 3 (three) times daily as needed for cough.  . Biotin 5000 MCG CAPS Take 1 tablet by mouth daily.  . budesonide-formoterol (SYMBICORT) 160-4.5 MCG/ACT inhaler  Inhale 2 puffs into the lungs 2 (two) times daily.  . Calcium Carb-Cholecalciferol 600-800 MG-UNIT TABS Take 1 tablet by mouth daily.  . carbidopa-levodopa (SINEMET IR) 25-100 MG tablet Take 1 tablet by mouth 3 (three) times daily.  . Coenzyme Q10 (CO Q-10) 100 MG CHEW Chew 1 Dose by mouth daily.  . furosemide (LASIX) 40 MG tablet Take 0.5 tablets (20 mg total) by mouth daily as needed for fluid (For swelling).  . gabapentin (NEURONTIN) 100 MG capsule Take 100 mg by mouth daily. Takes as needed.  . Lactobacillus (PROBIOTIC ACIDOPHILUS PO) Take 1 tablet by mouth daily.  . metFORMIN (GLUCOPHAGE) 500 MG tablet TAKE 2 TABLETS TWICE DAILY WITH A MEAL (Patient taking differently: TAKE 1000 mg TABLETS TWICE DAILY WITH A MEAL)  . metoprolol succinate (TOPROL-XL) 50 MG 24 hr tablet TAKE 1 AND 1/2 TABLETS DAILY FOR 1 WEEK, THEN INCREASE TO 2 TABLETS DAILY AS DIRECTED (DOSE CHANGE) (Patient taking differently: 50 mg in the morning and 50 mg in the evening)  . Multiple Vitamins-Minerals (OCUVITE ADULT 50+ PO) Take 1 tablet by mouth daily.  . potassium chloride (K-DUR,KLOR-CON) 10 MEQ tablet TAKE 1 TABLET DAILY AS NEEDED (TAKE WITH LASIX AS NEEDED FOR SWELLING). (Patient taking differently: TAKE 10 mg  TABLET DAILY AS NEEDED (TAKE WITH LASIX AS NEEDED FOR SWELLING).)  . ranitidine (ZANTAC) 150 MG tablet Take 1 tablet (150 mg total) by mouth 2 (two) times daily. (Patient taking differently: Take 150 mg by mouth as needed. )  . Turmeric 500 MG TABS Take 1 tablet by mouth daily.  . budesonide-formoterol (SYMBICORT) 160-4.5 MCG/ACT inhaler Inhale 2 puffs into the lungs 2 (two) times daily.  [name] Kitchen losartan (COZAAR) 25 MG tablet Take 1 tablet (25 mg total) by mouth daily.  [name] Kitchen spironolactone (ALDACTONE) 25 MG tablet Take 0.5 tablets (12.5 mg total) by mouth daily.  . [DISCONTINUED] mometasone-formoterol (DULERA) 100-5 MCG/ACT AERO Inhale 2 puffs into the lungs 2 (two) times daily. Reports she is only taking 1 puff twice a  day because she can not afford medication.   No facility-administered encounter medications on file as of 11/13/2017.      Review of Systems  Constitutional:   No  weight loss, night sweats,  Fevers, chills,  +fatigue, or  lassitude.  HEENT:   No headaches,  Difficulty swallowing,  Tooth/dental problems, or  Sore throat,                No sneezing, itching, ear ache, nasal congestion, post nasal drip,   CV:  No chest pain,  Orthopnea, PND, swelling in lower extremities, anasarca, dizziness, palpitations, syncope.   GI  No heartburn, indigestion, abdominal pain, nausea, vomiting, diarrhea, change in bowel habits, loss of appetite, bloody stools.   Resp:    No chest wall deformity  Skin: no rash or lesions.  GU: no dysuria, change in color of urine, no urgency or frequency.  No flank pain, no hematuria  MS:  No joint pain or swelling.  No decreased range of motion.  No back pain.    Physical Exam  BP (!) 110/58 (BP Location: Left Arm, Cuff Size: Normal)   Pulse 79   Ht 5\' 2"  (1.575 m)   Wt 130 lb 12.8 oz (59.3 kg)   LMP 08/01/2000 (Approximate)   SpO2 96%   BMI 23.92 kg/m   GEN: A/Ox3; pleasant , NAD, elderly ,cane    HEENT:  [place]/AT,  EACs-clear, TMs-wnl, NOSE-clear, THROAT-clear, no lesions, no postnasal drip or exudate noted.   NECK:  Supple w/ fair ROM; no JVD; normal carotid impulses w/o bruits; no thyromegaly or nodules palpated; no lymphadenopathy.    RESP  Clear  P & A; w/o, wheezes/ rales/ or rhonchi. no accessory muscle use, no dullness to percussion  CARD:  RRR, no m/r/g, no peripheral edema, pulses intact, no cyanosis or clubbing.  GI:   Soft & nt; nml bowel sounds; no organomegaly or masses detected.   Musco: Warm bil, no deformities or joint swelling noted.   Neuro: alert, no focal deficits noted.    Skin: Warm, no lesions or rashes    Lab Results:  CBC  BMET   Imaging: Dg Chest 2 View  Result Date: 10/17/2017 CLINICAL DATA:  Productive  cough, mid chest discomfort, and shortness of breath for the past 5 days. History of previous episodes of pneumonia and bronchitis. History of CHF, asthma EXAM: CHEST - 2 VIEW COMPARISON:  PA and lateral chest x-ray of October 15, 2017. FINDINGS: The lungs remain hyperinflated. There is new increased density at the left lung base laterally. There is stable linear increased density at the right lung base. The heart and pulmonary vascularity are normal. The ICD is in stable position. There is mild multilevel degenerative disc disease of the thoracic spine. IMPRESSION: COPD. Subsegmental atelectasis has developed at the left lung base. Atelectasis or scarring is stable in the right infrahilar region. No acute pneumonia nor CHF. Electronically Signed   By: David  Martinique M.D.   On: 10/17/2017 12:58   Dg Chest 2 View  Result Date: 10/15/2017 CLINICAL DATA:  Headache.  Productive cough. EXAM: CHEST - 2 VIEW COMPARISON:  Chest radiograph 04/07/2017. FINDINGS: Multi lead AICD device overlies the left hemithorax. Stable cardiac and mediastinal contours. No consolidative pulmonary opacities. No pleural effusion or pneumothorax. Thoracic spine degenerative changes. IMPRESSION: No acute cardiopulmonary process. Electronically Signed   By: Lovey Newcomer M.D.   On: 10/15/2017 15:15   Ct Head Wo Contrast  Result Date: 10/17/2017 CLINICAL DATA:  Unwitnessed fall today.  Headache. EXAM: CT HEAD WITHOUT CONTRAST TECHNIQUE: Contiguous axial images were obtained from the base of the skull through the vertex without intravenous contrast. COMPARISON:  None. FINDINGS: Brain: There is no evidence of acute infarct, intracranial hemorrhage, intra-axial mass, midline shift, or extra-axial fluid collection. 2 cm focally asymmetric extra-axial CSF in the left parafalcine region near the vertex mildly displaces the adjacent frontal lobe gyri and may represent an incidental arachnoid cyst. The ventricles are normal in size. Periventricular and  subcortical white matter hypoattenuation bilaterally is nonspecific but compatible with mild chronic small vessel ischemic disease. Vascular: Calcified atherosclerosis at the skull base. No hyperdense vessel. Skull: No fracture or focal osseous lesion. Sinuses/Orbits: Minimal mucosal thickening in the maxillary and ethmoid sinuses bilaterally. Trace left maxillary sinus fluid. Clear mastoid air cells. Unremarkable orbits. Other: None. IMPRESSION: 1. No evidence of acute intracranial abnormality. 2. Mild chronic small vessel ischemic disease.  Electronically Signed   By: Logan Bores M.D.   On: 10/17/2017 17:10     Assessment & Plan:   Mild persistent asthma in adult without complication Controlled on current regimen  Need to check with formulary to pick covered inhaler.   Plan  Patient Instructions  Get in touch with your insurance company to see what is covered on your insurance for your Asthma -  Continue on Dulera 2 puffs Twice daily  , rinse after use.  Follow up with Dr. Lake Bells in 2 months and As needed   Please contact office for sooner follow up if symptoms do not improve or worsen or seek emergency care        Allergic rhinitis Controlled on rx      Rexene Edison, NP 11/13/2017

## 2017-11-13 NOTE — Progress Notes (Signed)
Reviewed, agree 

## 2017-11-13 NOTE — Assessment & Plan Note (Signed)
Controlled on rx   

## 2017-11-13 NOTE — Patient Instructions (Addendum)
Get in touch with your insurance company to see what is covered on your insurance for your Asthma -  Continue on Dulera 2 puffs Twice daily  , rinse after use.  Follow up with Dr. Lake Bells in 2 months and As needed   Please contact office for sooner follow up if symptoms do not improve or worsen or seek emergency care

## 2017-11-14 ENCOUNTER — Ambulatory Visit: Payer: Medicare Other | Admitting: *Deleted

## 2017-11-14 ENCOUNTER — Telehealth: Payer: Self-pay | Admitting: Pharmacist

## 2017-11-14 NOTE — Patient Outreach (Signed)
[facility]) Care Management  11/14/2017  ALLYSSON RINEHIMER [birth year] [id number]   Called patient regarding medication assistance. Unfortunately, before I could obtain HIPAA information, the patient informed me today was not a good time to talk because she was with her grandsons.  She asked me to call her back another day.    Plan: Send patient an unsuccessful contact letter and call her back in 3-4 business days.   Elayne Guerin, PharmD, [place] Clinical Pharmacist [phone]

## 2017-11-16 ENCOUNTER — Other Ambulatory Visit: Payer: Self-pay

## 2017-11-16 NOTE — Patient Outreach (Signed)
Transition of care: Placed call to patient for transition of care. No answer. Left a message requesting a call back.  Jashiya Bassett, RN, BSN, CEN [facility] Community Care Coordinator [phone] 

## 2017-11-17 ENCOUNTER — Ambulatory Visit (INDEPENDENT_AMBULATORY_CARE_PROVIDER_SITE_OTHER): Payer: Medicare Other | Admitting: Physician Assistant

## 2017-11-17 ENCOUNTER — Encounter: Payer: Self-pay | Admitting: Physician Assistant

## 2017-11-17 VITALS — BP 108/60 | HR 80 | Ht 62.0 in | Wt 132.0 lb

## 2017-11-17 DIAGNOSIS — I10 Essential (primary) hypertension: Secondary | ICD-10-CM | POA: Diagnosis not present

## 2017-11-17 DIAGNOSIS — G20A1 Parkinson's disease without dyskinesia, without mention of fluctuations: Secondary | ICD-10-CM

## 2017-11-17 DIAGNOSIS — I5022 Chronic systolic (congestive) heart failure: Secondary | ICD-10-CM

## 2017-11-17 DIAGNOSIS — Z95810 Presence of automatic (implantable) cardiac defibrillator: Secondary | ICD-10-CM

## 2017-11-17 DIAGNOSIS — G20 Parkinson's disease: Secondary | ICD-10-CM | POA: Diagnosis not present

## 2017-11-17 NOTE — Progress Notes (Signed)
Cardiology Office Note:    Date:  11/17/2017   ID:  Veronica Shaw, DOB Jun 11, 1951, MRN [id number]  PCP:  Rogue Bussing, MD  Cardiologist:  Sherren Mocha, MD  Electrophysiologist: Cristopher Peru, MD Pulmonologist: Simonne Maffucci, MD Neurologist: Alonza Bogus, DO  Referring MD: Larey Seat*   Chief Complaint  Patient presents with  . Hospitalization Follow-up    Admitted with syncope  . Follow-up    CHF    History of Present Illness:    Veronica Shaw is a 67 y.o. female with nonischemic cardiomyopathy, systolic CHF, s/p AICD 09/7856, diabetes, HL, sleep apnea, asthma, Parkinson's disease.Echocardiogram in 5/17 demonstrated EF 85-02%, mild diastolic dysfunction and mild MR. RHC in 5/17 demonstrated preserved cardiac output and low intracardiac filling pressures.  She has continued to have significant shortness of breath.  Last seen by Dr. Burt Knack in November 2018.  She was admitted 3/19-3/21 with syncope secondary to dehydration from influenza.  She was seen by cardiology.  Of note, echocardiogram demonstrated normal LV function with moderate diastolic dysfunction.  Interrogation of her device demonstrated no arrhythmias.  Spironolactone and ARB were held, but were to be resumed at discharge.  I see him clinic follow-up demonstrated abnormal thoracic impedance suggesting fluid accumulation.  She was given several days of Lasix and last check 11/06/17 demonstrated normal thoracic impedance.  She is followed by [facility] case management.  [name] returns for follow-up.  She is doing well.  Her balance is not good and she uses a cane.  She denies chest discomfort.  Her breathing is improved.  She denies PND or lower extremity edema.  She has had some low blood pressures with associated dizziness.  However, this seems to have improved.  She denies syncope.  Prior CV studies:   The following studies were reviewed today:  Echo 10/18/17 EF 60-65, normal wall motion, grade 2  diastolic dysfunction  Echo 01/11/17 EF 30-35, diffuse HK, grade 1 diastolic dysfunction, trivial MR, mild LAE, mild TR  Nuclear stress test 12/29/16 Large size, moderate intensity fixed inferoseptal/septal perfusion defect consistent with LBBB artifact. No reversible ischemia. LVEF 49% with incoordinate septal motion. This is an intermediate risk study (due to LVEF <50).  RHC 12/16/15 CO 2.9; mean RA 1, PASP 15, mean PA 10, mean PCWP 2 1. Low intracardiac filling pressures 2. Preserved cardiac output  Echo 12/11/15 EF 20-25%, severe diffuse HK, marked systolic dyssynchrony, grade 1 diastolic dysfunction, mild MR  Cardiac MRI (4/15): 1. Mild LVE, EF 34%, Diff HK, paradoxical septal motion c/w LBBB, small focal area of late gadolinium enhancement at basal anteroseptum - may represent RV volume overload or a focal infiltrative disease such as sarcoidosis, but certainly wouldn't explain the degree of of LV dysfunction. - c/w NICM, normal RV size and function (RVEF 61%), Mild MR  LHC (10/14): Minor luminal irregularity in prox LAD, EF 35%  Echo (2/15): EF 30-35%, diff HK, ant-sept AK, Gr 2 DD, mild MR, trivial TR  Nuclear (5/13): Normal stress nuclear study. LV Ejection Fraction: 58%  Past Medical History:  Diagnosis Date  . Asthma   . Chronic systolic CHF (congestive heart failure) ([place])    a. cMRI 4/15: EF 34% and findings - c/w NICM, normal RV size and function (RVEF 61%), Mild MR // b. Echo 2/15:  EF 30-35%, diff HK, ant-sept AK, Gr 2 DD, mild MR, trivial TR  //  c. Echo 5/17: EF 20-25%, severe diffuse HK, marked systolic dyssynchrony, grade 1 diastolic dysfunction,  mild MR  //  d. RHC 5/17: Fick CO 2.9, RVSP 19, PASP 15, PW mean 2, low filing pressures and preserved CO   . Diabetes mellitus   . Gastritis   . History of echocardiogram    Echo 6/18: EF 30-35, diffuse HK, grade 1 diastolic dysfunction, trivial MR, mild LAE, mild TR, no pericardial effusion  . History of  nuclear stress test    Myoview 5/18: EF 49, no ischemia, inferoseptal defect c/w LBBB artifact (intermediate risk due to EF < 50).  [name] Kitchen HTN (hypertension)   . Hyperlipidemia   . NICM (nonischemic cardiomyopathy) ([place])    a. Nuclear 5/13: Normal stress nuclear study. LV Ejection Fraction: 58%  //  b. LHC 10/14: Minor luminal irregularity in prox LAD, EF 35%   . Parkinson disease ([place])   . Plantar fasciitis   . Sleep apnea    was retested and no longer had it and so d/c CPAP  . Urticaria    Surgical Hx: The patient  has a past surgical history that includes Tubal ligation; Cholecystectomy; Bunionectomy; Implantable cardioverter defibrillator implant (11-25-13); left and right heart catheterization with coronary angiogram (N/A, 05/31/2013); implantable cardioverter defibrillator implant (N/A, 11/25/2013); Tonsillectomy; and Cardiac catheterization (N/A, 12/16/2015).   Current Medications: Current Meds  Medication Sig  . albuterol (PROVENTIL HFA;VENTOLIN HFA) 108 (90 Base) MCG/ACT inhaler Inhale 2 puffs into the lungs every 4 (four) hours as needed for wheezing or shortness of breath.  [name] Kitchen albuterol (PROVENTIL) (2.5 MG/3ML) 0.083% nebulizer solution Take 3 mLs (2.5 mg total) by nebulization every 6 (six) hours as needed for wheezing or shortness of breath.  . benzonatate (TESSALON) 200 MG capsule Take 1 capsule (200 mg total) by mouth 3 (three) times daily as needed for cough.  . Biotin 5000 MCG CAPS Take 1 tablet by mouth daily.  . budesonide-formoterol (SYMBICORT) 160-4.5 MCG/ACT inhaler Inhale 2 puffs into the lungs 2 (two) times daily.  . budesonide-formoterol (SYMBICORT) 160-4.5 MCG/ACT inhaler Inhale 2 puffs into the lungs 2 (two) times daily.  . Calcium Carb-Cholecalciferol 600-800 MG-UNIT TABS Take 1 tablet by mouth daily.  . carbidopa-levodopa (SINEMET IR) 25-100 MG tablet Take 1 tablet by mouth 3 (three) times daily.  . Coenzyme Q10 (CO Q-10) 100 MG CHEW Chew 1 Dose by mouth daily.  .  furosemide (LASIX) 40 MG tablet Take 0.5 tablets (20 mg total) by mouth daily as needed for fluid (For swelling).  . gabapentin (NEURONTIN) 100 MG capsule Take 100 mg by mouth daily. Takes as needed.  . Lactobacillus (PROBIOTIC ACIDOPHILUS PO) Take 1 tablet by mouth daily.  . metFORMIN (GLUCOPHAGE) 500 MG tablet TAKE 2 TABLETS TWICE DAILY WITH A MEAL (Patient taking differently: TAKE 1000 mg TABLETS TWICE DAILY WITH A MEAL)  . metoprolol succinate (TOPROL-XL) 50 MG 24 hr tablet TAKE 1 AND 1/2 TABLETS DAILY FOR 1 WEEK, THEN INCREASE TO 2 TABLETS DAILY AS DIRECTED (DOSE CHANGE) (Patient taking differently: 50 mg in the morning and 50 mg in the evening)  . Multiple Vitamins-Minerals (OCUVITE ADULT 50+ PO) Take 1 tablet by mouth daily.  . potassium chloride (K-DUR,KLOR-CON) 10 MEQ tablet TAKE 1 TABLET DAILY AS NEEDED (TAKE WITH LASIX AS NEEDED FOR SWELLING). (Patient taking differently: TAKE 10 mg  TABLET DAILY AS NEEDED (TAKE WITH LASIX AS NEEDED FOR SWELLING).)  . ranitidine (ZANTAC) 150 MG tablet Take 1 tablet (150 mg total) by mouth 2 (two) times daily. (Patient taking differently: Take 150 mg by mouth as needed. )  . Turmeric  500 MG TABS Take 1 tablet by mouth daily.     Allergies:   Aspirin; Penicillins; Prempro [conj estrog-medroxyprogest ace]; Ace inhibitors; and Simvastatin   Social History   Tobacco Use  . Smoking status: Never Smoker  . Smokeless tobacco: Never Used  . Tobacco comment: exposed to smoke during child hood (parents)  Substance Use Topics  . Alcohol use: No    Alcohol/week: 0.0 oz  . Drug use: No     Family Hx: The patient's family history includes Coronary artery disease in her father and mother; Diabetes in her father, mother, sister, and sister; Diabetes (age of onset: 47) in her son; Heart attack in her father and mother; Heart disease in her sister, sister, and sister; Hepatitis C in her sister; Parkinson's disease in her sister; Stroke in her mother. There is no  history of Breast cancer.  ROS:   Please see the history of present illness.    Review of Systems  Cardiovascular: Positive for dyspnea on exertion.  Respiratory: Positive for snoring.   Neurological: Positive for dizziness.   All other systems reviewed and are negative.   EKGs/Labs/Other Test Reviewed:    EKG:  EKG is  ordered today.  The ekg ordered today demonstrates normal sinus rhythm, heart rate 80, left bundle branch block  Recent Labs: 12/28/2016: NT-Pro BNP 244 10/15/2017: B Natriuretic Peptide 26.9 10/19/2017: BUN 18; Creatinine, Ser 1.16; Hemoglobin 12.2; Platelets 188; Potassium 4.1; Sodium 139; TSH 0.458   Recent Lipid Panel Lab Results  Component Value Date/Time   CHOL 172 05/31/2013 04:35 AM   TRIG 142 05/31/2013 04:35 AM   HDL 49 05/31/2013 04:35 AM   CHOLHDL 3.5 05/31/2013 04:35 AM   LDLCALC 95 05/31/2013 04:35 AM   LDLDIRECT 111 (H) 02/10/2015 11:45 AM    Physical Exam:    VS:  BP 108/60   Pulse 80   Ht 5\' 2"  (1.575 m)   Wt 132 lb (59.9 kg)   LMP 08/01/2000 (Approximate)   SpO2 96%   BMI 24.14 kg/m     Wt Readings from Last 3 Encounters:  11/17/17 132 lb (59.9 kg)  11/13/17 130 lb 12.8 oz (59.3 kg)  11/10/17 133 lb (60.3 kg)     Physical Exam  Constitutional: She is oriented to person, place, and time. She appears well-developed and well-nourished. No distress.  HENT:  Head: Normocephalic and atraumatic.  Neck: Neck supple.  Cardiovascular: Normal rate, regular rhythm, S1 normal, S2 normal and normal heart sounds.  No murmur heard. Pulmonary/Chest: Effort normal. She has no rhonchi. She has no rales.  Abdominal: Soft. There is no hepatomegaly.  Musculoskeletal: She exhibits no edema.  Neurological: She is alert and oriented to person, place, and time.  Skin: Skin is warm and dry.    ASSESSMENT & PLAN:    #1.  Chronic systolic heart failure (HCC)  EF was previously 20-25.  However, most recent echo during admission for syncope in the  setting of dehydration and influenza demonstrated EF 60-65.  She seems to be tolerating her current medications.  Continue ARB, beta-blocker, spironolactone.  I have advised her to hold her losartan if her morning systolic blood pressure is <100.  If she has a do this frequently, we should consider decreasing metoprolol succinate to 25 mg daily.  #2.  Essential hypertension The patient's blood pressure is controlled on her current regimen.  Continue current therapy.  #3.  ICD (implantable cardioverter-defibrillator), dual, in situ Follow-up with EP as planned.  #4.  Parkinson disease [facility]) She has had some issues with balance.  She may benefit from physical therapy.  I have asked her to discuss further with her neurologist and/or her primary care provider.    Dispo:  Return in about 6 months (around 05/19/2018) for Routine Follow Up, w/ Dr. Burt Knack.   Medication Adjustments/Labs and Tests Ordered: Current medicines are reviewed at length with the patient today.  Concerns regarding medicines are outlined above.  Tests Ordered: Orders Placed This Encounter  Procedures  . EKG 12-Lead   Medication Changes: No orders of the defined types were placed in this encounter.   Signed, Richardson Dopp, PA-C  11/17/2017 10:20 AM    [facility] [address], [place], [place]  [address] Phone: [phone]; Fax: [phone]

## 2017-11-17 NOTE — Patient Instructions (Signed)
Medication Instructions:  No changes. Check your blood pressure before your medications in the morning.  If the top number (systolic blood pressure) is less than 100, hold your Losartan that day. If you hold your Losartan a lot for low blood pressure, call us.  Labwork: None   Testing/Procedures: None   Follow-Up: Sherren Mocha, MD in 6 months.   Any Other Special Instructions Will Be Listed Below (If Applicable).  If you need a refill on your cardiac medications before your next appointment, please call your pharmacy.

## 2017-11-20 ENCOUNTER — Other Ambulatory Visit: Payer: Self-pay | Admitting: Pharmacist

## 2017-11-20 ENCOUNTER — Ambulatory Visit: Payer: Self-pay | Admitting: Pharmacist

## 2017-11-20 NOTE — Patient Outreach (Signed)
[place]) Care Management  11/20/2017  TERRANCE LANAHAN Aug 18, 1950 [id number]   Patient was called. Unfortunately, she did not answer the phone. HIPAA compliant message was left on her voicemail.  Plan:   Call patient back in 3-4 business days   Elayne Guerin, PharmD, [place] Clinical Pharmacist [phone]

## 2017-11-21 ENCOUNTER — Other Ambulatory Visit: Payer: Self-pay | Admitting: Internal Medicine

## 2017-11-22 ENCOUNTER — Other Ambulatory Visit: Payer: Self-pay

## 2017-11-22 NOTE — Patient Outreach (Signed)
Transition of care: Received voice mail from patient reports that she is doing well.   Attempted to recall patient. No answer. Left another message.  PLAN: Will continue weekly transition of care calls. Care plan not updated due to voicemail only and I was unable to education on any interventions.  Tomasa Rand, RN, BSN, CEN [facility] [employer] [phone]

## 2017-11-23 ENCOUNTER — Other Ambulatory Visit: Payer: Self-pay | Admitting: Pharmacist

## 2017-11-23 ENCOUNTER — Ambulatory Visit: Payer: Self-pay | Admitting: Pharmacist

## 2017-11-23 NOTE — Patient Outreach (Signed)
[place] [facility]) Care Management  11/23/2017  Veronica Shaw 02/03/1951 [id number]   Patient called me back yesterday from my call to her on 11/20/17.  I called her back today. Unfortunately, she did not answer the phone.  HIPAA compliant message was left on her voicemail.  Plan: Call patient back in 2-3 business days.   Elayne Guerin, PharmD, [place] Clinical Pharmacist [phone]

## 2017-11-27 ENCOUNTER — Ambulatory Visit: Payer: Self-pay | Admitting: Pharmacist

## 2017-11-27 ENCOUNTER — Other Ambulatory Visit: Payer: Self-pay | Admitting: Pharmacist

## 2017-11-27 NOTE — Patient Outreach (Addendum)
[place] [facility]) Care Management  11/27/2017  Veronica Shaw Dec 20, 1950 [id number]   Patient was called again regarding medication assistance. Unfortunately, she did not answer the phone.  HIPAA compliant message was left on her voicemail.  Plan: Call patient back in 3-4 business days.  Elayne Guerin, PharmD, BCACP [facility] Clinical Pharmacist [phone]  ADDENDUM  Subjective:  Patient called me back. HIPAA identifiers were obtained.  Patient is a 67 year old female with multiple medical conditions including but not limited to:  Allergic rhinitis, CHF, GERD, hyperlipidemia, depression, osteopenia, sleep apnea, lumbosacral neuritis, and type 2 diabetes.  Patient manages her own medications and expressed concerns paying for Symbicort.  Objective:  Medications Reviewed Today    Reviewed by Elayne Guerin, [facility] (Pharmacist) on 11/27/17 at 1557  Med List Status: <None>  Medication Order Taking? Sig Documenting Provider Last Dose Status Informant  albuterol (PROVENTIL HFA;VENTOLIN HFA) 108 (90 Base) MCG/ACT inhaler [id number]  Inhale 2 puffs into the lungs every 4 (four) hours as needed for wheezing or shortness of breath. Rogue Bussing, MD  Active Self  albuterol (PROVENTIL) (2.5 MG/3ML) 0.083% nebulizer solution [id number] Yes Take 3 mLs (2.5 mg total) by nebulization every 6 (six) hours as needed for wheezing or shortness of breath. Deneise Lever, MD Taking Active Self  benzonatate (TESSALON) 200 MG capsule [id number] Yes Take 1 capsule (200 mg total) by mouth 3 (three) times daily as needed for cough. Verner Mould, MD Taking Active   Biotin 5000 MCG CAPS [id number] Yes Take 1 tablet by mouth daily. [provider] Taking Active   budesonide-formoterol (SYMBICORT) 160-4.5 MCG/ACT inhaler [id number] Yes Inhale 2 puffs into the lungs 2 (two) times daily. Parrett, Fonnie Mu, NP Taking Active   Calcium Carb-Cholecalciferol 600-800 MG-UNIT TABS  [id number] Yes Take 1 tablet by mouth daily. Rogue Bussing, MD Taking Active Self  carbidopa-levodopa (SINEMET IR) 25-100 MG tablet [id number] Yes Take 1 tablet by mouth 3 (three) times daily. Ludwig Clarks, DO Taking Active Self  Coenzyme Q10 (CO Q-10) 100 MG CHEW [id number] Yes Chew 1 Dose by mouth daily. [provider] Taking Active   furosemide (LASIX) 40 MG tablet [id number] Yes Take 0.5 tablets (20 mg total) by mouth daily as needed for fluid (For swelling). Rogue Bussing, MD Taking Active Self           Med Note Corinna Lines Nov 27, 2017  2:34 PM) Patient reported taking 1 tablet daily  gabapentin (NEURONTIN) 100 MG capsule [id number] Yes Take 100 mg by mouth daily. Takes as needed. [provider] Taking Active   Lactobacillus (PROBIOTIC ACIDOPHILUS PO) [id number] Yes Take 1 tablet by mouth daily. [provider] Taking Active   losartan (COZAAR) 25 MG tablet [id number]  Take 1 tablet (25 mg total) by mouth daily. Sherren Mocha, MD  Expired 10/17/17 2359 Self           Med Note [name], [name]   Fri Nov 10, 2017 10:59 AM) Is actively taking this medications  metFORMIN (GLUCOPHAGE) 500 MG tablet [id number] Yes TAKE 2 TABLETS TWICE DAILY WITH A MEAL  Patient taking differently:  TAKE 1000 mg TABLETS TWICE DAILY WITH A MEAL   Fitzgerald, Sharman Cheek, MD Taking Active Self  metoprolol succinate (TOPROL-XL) 50 MG 24 hr tablet [id number] Yes Take 1 tablet (50 mg total) by mouth 2 (two) times daily. [name], MD Taking Active   Multiple Vitamins-Minerals [facility] ADULT 50+  PO) [id number] Yes Take 1 tablet by mouth daily. [provider] Taking Active   potassium chloride (K-DUR,KLOR-CON) 10 MEQ tablet [id number] Yes TAKE 1 TABLET DAILY AS NEEDED (TAKE WITH LASIX AS NEEDED FOR SWELLING).  Patient taking differently:  TAKE 10 mg  TABLET DAILY AS NEEDED (TAKE WITH LASIX AS NEEDED FOR SWELLING).   Rogue Bussing, MD Taking  Active Self  ranitidine (ZANTAC) 150 MG tablet [id number] Yes Take 1 tablet (150 mg total) by mouth 2 (two) times daily.  Patient taking differently:  Take 150 mg by mouth as needed.    Rogue Bussing, MD Taking Active Self  spironolactone (ALDACTONE) 25 MG tablet [id number]  Take 0.5 tablets (12.5 mg total) by mouth daily. Daune Perch, NP  Expired 10/17/17 2359 Self           Med Note [name], [name]   Fri Nov 10, 2017 11:01 AM) Takes daily  Turmeric 500 MG TABS [id number] Yes Take 1 tablet by mouth daily. [provider] Taking Active           Patient's medications were reviewed via telephone:  Drugs sorted by system:  Neurologic/Psychologic: Gabapentin Carbidopa-levodopa  Cardiovascular: Losartan Metoprolol Furosemide Spironolactone  Pulmonary/Allergy: Benzonatate Symbicort Albuterol HFA Albuterol nebulizer   Gastrointestinal: Probiotic Ranitidine  Endocrine: Metformin  Vitamins/Minerals: Biotin Calcium Carb-Vitamin D Coenzyme 10  Ocuvite Potassium Chloride Turmeric  Drug interactions: -potassium/losartan/spironolactone--may cause hyperkalemia--patient's K was 4.1 mmol/L  Medication Assistance Findings:  -patient is over income for the Extra Help Program -She has a [place] was called.  Patient has a $415 deductible for tier 3-5 medications that she has NOT met -she appears to qualify for [employer]'s program for Symbicort but has not spent the required out-of-pocket expense requirement of 3% of her income on medications.   Plan: Route note to patient's PCP and Pulmonologist Ask patient's pulmonologist (Dr. Lake Bells) if he will consider switching her to [facility] and Proventil as both are available from [place].  Call patient back in 7-10 business days.   Elayne Guerin, PharmD, [place] Clinical Pharmacist [phone]

## 2017-11-27 NOTE — Addendum Note (Signed)
Addended by: Elayne Guerin on: 11/27/2017 03:02 PM   Modules accepted: Orders

## 2017-11-28 ENCOUNTER — Other Ambulatory Visit: Payer: Self-pay

## 2017-11-28 NOTE — Patient Outreach (Signed)
Transition of care: Placed call to patient. No answer.  Left a message requesting a call back.  PLAN: will mail outreach letter and attempt again.  Tomasa Rand, RN, BSN, CEN [facility] [employer] [phone]

## 2017-11-30 ENCOUNTER — Ambulatory Visit (INDEPENDENT_AMBULATORY_CARE_PROVIDER_SITE_OTHER): Payer: Medicare Other | Admitting: *Deleted

## 2017-11-30 ENCOUNTER — Other Ambulatory Visit: Payer: Self-pay

## 2017-11-30 ENCOUNTER — Encounter: Payer: Self-pay | Admitting: Internal Medicine

## 2017-11-30 ENCOUNTER — Ambulatory Visit (INDEPENDENT_AMBULATORY_CARE_PROVIDER_SITE_OTHER): Payer: Medicare Other | Admitting: Internal Medicine

## 2017-11-30 VITALS — BP 106/62 | HR 77 | Temp 98.2°F | Ht 62.0 in | Wt 131.2 lb

## 2017-11-30 DIAGNOSIS — J45909 Unspecified asthma, uncomplicated: Secondary | ICD-10-CM

## 2017-11-30 DIAGNOSIS — J4530 Mild persistent asthma, uncomplicated: Secondary | ICD-10-CM

## 2017-11-30 DIAGNOSIS — I428 Other cardiomyopathies: Secondary | ICD-10-CM | POA: Diagnosis not present

## 2017-11-30 DIAGNOSIS — R252 Cramp and spasm: Secondary | ICD-10-CM | POA: Diagnosis not present

## 2017-11-30 DIAGNOSIS — M79601 Pain in right arm: Secondary | ICD-10-CM

## 2017-11-30 MED ORDER — GABAPENTIN 100 MG PO CAPS: 100.0000 mg | ORAL_CAPSULE | Freq: Every day | ORAL | 0 refills | Status: DC

## 2017-11-30 NOTE — Patient Instructions (Signed)
Ms. Primitivo Gauze may have a thrombophlebitis with sclerosis (hardening) after having the IV. This will improve with time but can certainly be uncomfortable. Try aspercreme with or without lidocaine.  For your leg pain, I am checking to make sure you don't have a problem with your electrolytes. Please try exercises to stretch your Achilles. I will call with results. You could also try the aspercreme on this area. If no improvement in a few days, try gabapentin 100 mg at night.  If no improvement in about a week, please see me back.  Best, Dr. Ola Spurr

## 2017-11-30 NOTE — Progress Notes (Signed)
OK to change to [facility]

## 2017-11-30 NOTE — Progress Notes (Signed)
Remote ICD transmission.   

## 2017-11-30 NOTE — Patient Outreach (Signed)
Incoming call from patient.  Patient called and states that she has been having cramps all night, Reports she is taking her potassium. Patient also states that she has "boils" from where her IV was.    PLAN: encouraged patient to call MD and be seen. Encouraged patient to stay hydrated and eat potassium rich foods.  Reviewed with patient if she gets worse she could go to urgent care if needed. Home Visit planned on 12/04/2017. Reminded patient of visit and she agrees.  Tomasa Rand, RN, BSN, CEN [facility] [employer] [phone]

## 2017-12-01 ENCOUNTER — Other Ambulatory Visit: Payer: Self-pay | Admitting: Pharmacy Technician

## 2017-12-01 ENCOUNTER — Other Ambulatory Visit: Payer: Self-pay | Admitting: Pharmacist

## 2017-12-01 ENCOUNTER — Ambulatory Visit (INDEPENDENT_AMBULATORY_CARE_PROVIDER_SITE_OTHER): Payer: Medicare Other

## 2017-12-01 ENCOUNTER — Telehealth: Payer: Self-pay | Admitting: Internal Medicine

## 2017-12-01 DIAGNOSIS — I5022 Chronic systolic (congestive) heart failure: Secondary | ICD-10-CM

## 2017-12-01 DIAGNOSIS — Z95810 Presence of automatic (implantable) cardiac defibrillator: Secondary | ICD-10-CM | POA: Diagnosis not present

## 2017-12-01 DIAGNOSIS — N183 Chronic kidney disease, stage 3 unspecified: Secondary | ICD-10-CM

## 2017-12-01 LAB — BASIC METABOLIC PANEL
BUN/Creatinine Ratio: 15 (ref 12–28)
BUN: 22 mg/dL (ref 8–27)
CO2: 23 mmol/L (ref 20–29)
Calcium: 11.4 mg/dL — ABNORMAL HIGH (ref 8.7–10.3)
Chloride: 101 mmol/L (ref 96–106)
Creatinine, Ser: 1.45 mg/dL — ABNORMAL HIGH (ref 0.57–1.00)
GFR calc Af Amer: 43 mL/min/{1.73_m2} — ABNORMAL LOW (ref >59)
GFR calc non Af Amer: 37 mL/min/{1.73_m2} — ABNORMAL LOW (ref >59)
Glucose: 112 mg/dL — ABNORMAL HIGH (ref 65–99)
Potassium: 5.3 mmol/L — ABNORMAL HIGH (ref 3.5–5.2)
Sodium: 140 mmol/L (ref 134–144)

## 2017-12-01 NOTE — Progress Notes (Signed)
Agree  Richardson Dopp, PA-C    12/01/2017 4:53 PM

## 2017-12-01 NOTE — Patient Outreach (Signed)
[place] [facility]) Care Management  12/01/2017  BRANNA CORTINA [birth year] [id number]   Received [employer] patient assistance referral from [facility]. WIll mail out patient and provider portions of applications on 93/23.  Will follow up with patient 7-10 days after 05/06  Maud Deed. [place], [place] Management [phone]

## 2017-12-01 NOTE — Progress Notes (Signed)
EPIC Encounter for ICM Monitoring  Patient Name: Veronica Shaw is a 67 y.o. female Date: 12/01/2017 Primary Care Physican: Rogue Bussing, MD Primary Cardiologist:Cooper/Weaver PA Electrophysiologist: Lovena Le Dry Weight:131lbs      Heart Failure questions reviewed, pt asymptomatic for fluid symptoms.  She reported having 17 leg cramps on night of 5/1.  It was so painful she cried all night.  She visited PCP yesterday and started on Gabapentin which is helping. BMET completed and Potassium is 5.3 and Creatinine is elevated compared 10/19/2017 results.  She only take Furosemide and Potassium PRN and last time she took it was about a week ago.   Patient saw Richardson Dopp, PA on 11/15/2017.   Thoracic impedance slightly abnormal suggesting fluid accumulation.  Prescribed dosage: Furosemide 20 mg 1 tablet daily as needed. Potassium 10 mEq 1 tablet daily as needed (taken with Lasix).   Labs: 11/30/2017 Creatinine 1.45, BUN 22, Potassium 5.3, Sodium 140, EGFR 37-43 10/19/2017 Creatinine1.16, BUN18, Potassium4.1, Sodium139, [id number] 10/18/2017 Creatinine1.24, BUN17, Potassium4.4, Sodium139, HRVA44-58  10/17/2017 Creatinine1.46, BUN23, Potassium4.8, Sodium135, AKLT07-57  10/15/2017 Creatinine1.61, BUN24, Potassium4.0, BAQVOH209, [id number] 04/07/2017 Creatinine 1.18, BUN 23, Potassium 4.1, Sodium 142, EGFR 46-54 04/04/2017 Creatinine 1.04, BUN 21, Potassium 4.6, Sodium 143, EGFR 55-64 01/25/2017 Creatinine 1.17, BUN 22, Potassium 4.5, Sodium 142, EGFR 48-56 12/28/2016 Creatinine 1.05, BUN 20, Potassium 4.2, Sodium 143, EGFR 55-64, NT-Pro BNP 244 12/21/2016 Creatinine 1.33, BUN 24, Potassium 5.1, Sodium 140 07/21/2016 Creatinine 1.18, BUN 17, Potassium 4.1, Sodium 139, EGFR 48-55  Recommendations:  She said PCP has referred her to nephrologist, advised her to drink lot of fluids to flush the kidneys and do not take any potassium pills.   Advised to follow PCP  instructions.  She leaves for vacation 12/06/2017 and returns on 12/14/2014.     Follow-up plan: ICM clinic phone appointment on 12/14/2017 to recheck fluid levels.   Copy of ICM check sent to Dr. Lovena Le, Dr. Burt Knack and Richardson Dopp, [place].   3 month ICM trend: 11/30/2017    1 Year ICM trend:       [name], RN 12/01/2017 10:05 AM

## 2017-12-01 NOTE — Patient Outreach (Addendum)
[place]) Care Management  12/01/2017  Veronica Shaw [birth year] [id number]  Patient was called to follow up on medication assistance. HIPAA identifiers were obtained. Before I was able to talk to the patient at length, she told me she was at the bank and would have to call me back.  Plan: Await a call back from the patient.  ADDENDUM  Patient called me back. HIPAA identifiers were obtained. After reviewing the patient's chart, it became apparent Dr. Lake Bells approved my request to change the patient to [facility] (from Symbicort) due to cost.  It was explained to the patient that an application would be mailed to her home and that she would need to complete the application and return a copy of her and her husband's 2018 Social Security Benefits awards letter.  Plan: Route note to Etter Sjogren, CPhT to have an application sent to the patient. Follow up on the application in 3 weeks.  Elayne Guerin, PharmD, [place] Clinical Pharmacist [phone]

## 2017-12-01 NOTE — Telephone Encounter (Signed)
Discussed worsening kidney function with patient. She would like referral to Nephrology.  Olene Floss, MD [place], PGY-3

## 2017-12-03 ENCOUNTER — Encounter: Payer: Self-pay | Admitting: Internal Medicine

## 2017-12-03 DIAGNOSIS — R252 Cramp and spasm: Secondary | ICD-10-CM | POA: Insufficient documentation

## 2017-12-03 DIAGNOSIS — M79601 Pain in right arm: Secondary | ICD-10-CM | POA: Insufficient documentation

## 2017-12-03 NOTE — Assessment & Plan Note (Signed)
-   Exam consistent with thrombophlebitis with sclerosis in the setting of recently having had an IV. Advised patient this will improve with time but that she could try topical aspercreme with or without lidocaine.

## 2017-12-03 NOTE — Assessment & Plan Note (Addendum)
-   Resolved at present. Will check BMP to rule-out electrolyte abnormality. Patient with history of sciatica, so could be a flare-up. Given how focal symptoms are, recommended trying topical aspercreme. If no improvement, could restart gabapentin 100 mg QHS. Also provided calf stretching exercises. Consider tarsal tunnel syndrome, but no pain near medial malleolus. Could not elicit pain over heel or bottom of foot, so unlikely plantar fasciitis.

## 2017-12-03 NOTE — Progress Notes (Signed)
[facility] Progress Note  Subjective:  Veronica Shaw is a 67 y.o. female with history of asthma, Parkinson's disease, NICM and CHF s/p ICD placement, T2DM well-controlled on metformin, and DDD who presents for leg cramps and arm bumps after IV placement.  #Leg cramps: - Patient reports 17 leg cramps of left lateral lower leg and foot over the course of last night into this afternoon - Denies increased/changed activity aside from sitting outside a lot yesterday and doing some exercises recommended by PT - Took a potassium supplement yesterday thinking her potassium could be low. Tried a topical foaming cream suggested by her daughter. Neither of these helped. - Has not recently taken gabapentin because her prescription is old ROS: No falls, no shortness of breath  #Arm bumps: - Has had painful swelling over superficial vein of her right forearm since hospitalization in March for the flu - Says that bumps on her arm have been "moving" and that area can be painful to the touch - Says her family member mentioned possibility of thrombophlebitis ROS: No fevers, no rashes  #Asthma: - Preferred being on dulera but this is too expensive. [facility] has suggested [employer] medication assistance.    Allergies  Allergen Reactions  . Aspirin Swelling and Other (See Comments)    Other reaction(s): Other (See Comments) Causes nose bleeds *ONLY THE COATED ASA* Causes nose bleeds *ONLY THE COATED ASA*  . Penicillins Shortness Of Breath and Rash    Shortness of Breath - Throat felt like it was closing.   [name] [Conj Estrog-Medroxyprogest Ace] Shortness Of Breath    Throat swelling Throat swelling  . Ace Inhibitors Cough  . Simvastatin Other (See Comments) and Rash    Muscle aches    Social History   Tobacco Use  . Smoking status: Never Smoker  . Smokeless tobacco: Never Used  . Tobacco comment: exposed to smoke during child hood (parents)  Substance Use Topics  . Alcohol use:  No    Alcohol/week: 0.0 oz    Objective: Blood pressure 106/62, pulse 77, temperature 98.2 F (36.8 C), temperature source Oral, height 5\' 2"  (1.575 m), weight 131 lb 3.2 oz (59.5 kg), last menstrual period 08/01/2000, SpO2 95 %. Body mass index is 24 kg/m. Constitutional: Pleasant female in NAD Musculoskeletal: Legs appear symmetric. Large bunion of left foot. No LE edema. Cannot elicit TTP on exam aside from mild TTP when squeezing left Achilles. No pain with plantar or dorsiflexion. Negative Homan's sign. Indicated that pain was mostly around lateral aspect of left foot and ankle.  Neurological: AOx3, no focal deficits. Skin: Skin is warm and dry. Tender pea-sized area of firmness over course of superficial vein along volar surface of forearm.  Psychiatric: Somewhat anxious affect.  Vitals reviewed  Assessment/Plan: Muscle cramp - Resolved at present. Will check BMP to rule-out electrolyte abnormality. Patient with history of sciatica, so could be a flare-up. Given how focal symptoms are, recommended trying topical aspercreme. If no improvement, could restart gabapentin 100 mg QHS. Also provided calf stretching exercises. Consider tarsal tunnel syndrome, but no pain near medial malleolus. Could not elicit pain over heel or bottom of foot, so unlikely plantar fasciitis.  Pain of right upper extremity - Exam consistent with thrombophlebitis with sclerosis in the setting of recently having had an IV. Advised patient this will improve with time but that she could try topical aspercreme with or without lidocaine.  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 Patient to complete and return to office. Will order dulera and albuterol through this program once application returned.   Follow-up in about a week if no improvement in pain.  Olene Floss, MD [place], PGY-3

## 2017-12-03 NOTE — Assessment & Plan Note (Signed)
-   Provided patient assistance application for [employer]. Patient to complete and return to office. Will order dulera and albuterol through this program once application returned.

## 2017-12-04 ENCOUNTER — Other Ambulatory Visit: Payer: Self-pay

## 2017-12-04 NOTE — Patient Outreach (Signed)
[facility]) Care Management   12/04/2017  Veronica Shaw 28-Apr-1951 [id number]  [name] is an 67 y.o. female  Subjective: Patient reports that she has had pain and a knot to the right forearm from a previous IV site. Patient reports that she had severe cramping last week.  Reports she saw MD and was diagnosed with worsening kidney function.  Reports cramping has stop and she has been referral to nephrologist.  Patient reports that she is monitoring her BP daily. Reports she is weighing daily but not recording. Reports that she is eating a low salt diet.   Objective:  Awake and alert.  Ambulating without of problems. Today's Vitals   12/04/17 1016 12/04/17 1019  BP: 118/60   Pulse: 76   Resp: 18   SpO2: 97%   Weight: 131 lb (59.4 kg)   PainSc:  5    Review of Systems  Constitutional: Negative.   HENT: Negative.   Eyes: Negative.   Respiratory: Negative.   Cardiovascular: Negative.   Gastrointestinal: Negative.   Genitourinary: Negative.   Musculoskeletal:       Recent cramps. Saw MD last week  Skin: Negative.   Neurological: Positive for tremors.       Recent blurred vision  Endo/Heme/Allergies: Negative.   Psychiatric/Behavioral: Negative.     Physical Exam  Constitutional: She is oriented to person, place, and time. She appears well-developed and well-nourished.  Cardiovascular: Normal rate, normal heart sounds and intact distal pulses.  Respiratory: Effort normal and breath sounds normal.  GI: Soft. Bowel sounds are normal.  Musculoskeletal: Normal range of motion. She exhibits no edema.  Neurological: She is alert and oriented to person, place, and time.  Skin: Skin is warm and dry.  Psychiatric: She has a normal mood and affect. Her behavior is normal. Judgment and thought content normal.    Encounter Medications:   Outpatient Encounter Medications as of 12/04/2017  Medication Sig Note  . albuterol (PROVENTIL HFA;VENTOLIN HFA) 108 (90 Base)  MCG/ACT inhaler Inhale 2 puffs into the lungs every 4 (four) hours as needed for wheezing or shortness of breath.   [name] Kitchen albuterol (PROVENTIL) (2.5 MG/3ML) 0.083% nebulizer solution Take 3 mLs (2.5 mg total) by nebulization every 6 (six) hours as needed for wheezing or shortness of breath.   . Biotin 5000 MCG CAPS Take 1 tablet by mouth daily.   . Calcium Carb-Cholecalciferol 600-800 MG-UNIT TABS Take 1 tablet by mouth daily.   . carbidopa-levodopa (SINEMET IR) 25-100 MG tablet Take 1 tablet by mouth 3 (three) times daily.   . Coenzyme Q10 (CO Q-10) 100 MG CHEW Chew 1 Dose by mouth daily.   . furosemide (LASIX) 40 MG tablet Take 0.5 tablets (20 mg total) by mouth daily as needed for fluid (For swelling). 12/04/2017: Takes about 2 times per week  . gabapentin (NEURONTIN) 100 MG capsule Take 1 capsule (100 mg total) by mouth at bedtime. Take as needed.   . Lactobacillus (PROBIOTIC ACIDOPHILUS PO) Take 1 tablet by mouth daily.   . metFORMIN (GLUCOPHAGE) 500 MG tablet TAKE 2 TABLETS TWICE DAILY WITH A MEAL (Patient taking differently: TAKE 1000 mg TABLETS TWICE DAILY WITH A MEAL)   . metoprolol succinate (TOPROL-XL) 50 MG 24 hr tablet Take 1 tablet (50 mg total) by mouth 2 (two) times daily.   . mometasone-formoterol (DULERA) 100-5 MCG/ACT AERO Inhale 2 puffs into the lungs 2 (two) times daily.   . Multiple Vitamins-Minerals (OCUVITE ADULT 50+ PO) Take 1  tablet by mouth daily.   . potassium chloride (K-DUR,KLOR-CON) 10 MEQ tablet TAKE 1 TABLET DAILY AS NEEDED (TAKE WITH LASIX AS NEEDED FOR SWELLING). (Patient taking differently: TAKE 10 mg  TABLET DAILY AS NEEDED (TAKE WITH LASIX AS NEEDED FOR SWELLING).)   . ranitidine (ZANTAC) 150 MG tablet Take 1 tablet (150 mg total) by mouth 2 (two) times daily. (Patient taking differently: Take 150 mg by mouth as needed. )   . Turmeric 500 MG TABS Take 1 tablet by mouth daily.   . benzonatate (TESSALON) 200 MG capsule Take 1 capsule (200 mg total) by mouth 3 (three)  times daily as needed for cough. (Patient not taking: Reported on 12/04/2017)   . losartan (COZAAR) 25 MG tablet Take 1 tablet (25 mg total) by mouth daily. 11/10/2017: Is actively taking this medications  . spironolactone (ALDACTONE) 25 MG tablet Take 0.5 tablets (12.5 mg total) by mouth daily. 11/10/2017: Takes daily   No facility-administered encounter medications on file as of 12/04/2017.     Functional Status:   In your present state of health, do you have any difficulty performing the following activities: 11/10/2017 10/18/2017  Hearing? N N  Vision? Y N  Comment wears glasses.   has cataracts -  Difficulty concentrating or making decisions? Y N  Walking or climbing stairs? Y N  Dressing or bathing? N Y  Doing errands, shopping? Y N  Comment goes with someone -  Conservation officer, nature and eating ? N -  Using the Toilet? Y -  Comment uses a bedside potty at night -  In the past six months, have you accidently leaked urine? Y -  Do you have problems with loss of bowel control? Y -  Managing your Medications? N -  Managing your Finances? N -  Housekeeping or managing your Housekeeping? N -  Some recent data might be hidden    Fall/Depression Screening:    Fall Risk  11/30/2017 11/10/2017 10/09/2017  Falls in the past year? Yes Yes Yes  Comment - - -  Number falls in past yr: 2 or more 2 or more 2 or more  Injury with Fall? No No No  Comment - - -  Risk Factor Category  High Fall Risk High Fall Risk High Fall Risk  Risk for fall due to : - History of fall(s) -  Follow up - Falls evaluation completed Falls evaluation completed   PHQ 2/9 Scores 11/30/2017 11/10/2017 11/03/2017 10/27/2017 05/30/2017 01/18/2017 12/22/2016  PHQ - 2 Score 0 0 0 0 0 0 0  PHQ- 9 Score - - - - - - -  Exception Documentation - - - - - - -    Assessment:   (1) Heart failure:  Patient reports reports following low salt diet. Reports weighing daily but not recording. (2) knots to the left forearm. (3) reports shortness of  breath with exercise. (4) pending vacation.  Plan:  (1) reviewed importance of daily weights, low salt diet. Reviewed importance of taking all medications as prescribed.   (2) reviewed recommendations from MD. (3)Encouraged patient to use inhaler as needed. (4) reviewed with patient the need to make sure she continues to weigh, follow low salt diet, and take her medications as prescribed.   Reviewed with patient when she will return from [place] in 10 days that I would call her for transition of care.  THN CM Care Plan Problem One     Most Recent Value  Care Plan Problem One  Recent  admission for falls and syncopy.  Role Documenting the Problem One  Care Management [place] for Problem One  Active  [facility] Long Term Goal   Patient will report no readmission for falls or hypotension in the next 90 days.   THN Long Term Goal Start Date  11/10/17  Interventions for Problem One Long Term Goal  Reviewed importance of monitoring BP and calling MD for low BP.  Reviewed fall precautions. home visit completed.   THN CM Short Term Goal #1   Patient will reports weighing and recording weight daily for the next 30 days.   THN CM Short Term Goal #1 Start Date  11/10/17  Interventions for Short Term Goal #1  Encouraged patient to record weights daily and take to MD office.   THN CM Short Term Goal #2   Patient will reports monitoring and recording daily BP in [facility] calendar for the next 30 days  THN CM Short Term Goal #2 Start Date  11/10/17  Interventions for Short Term Goal #2  Patient reports she is recording BP daily however patient unable to find log.  THN CM Short Term Goal #3  Patient will monitor and record CBG readings daily for the next 30 days.  THN CM Short Term Goal #3 Start Date  11/10/17  Interventions for Short Tern Goal #3  Encouraged patient to continue to self monitoring     Tomasa Rand, RN, BSN, CEN [facility] [employer] [phone]

## 2017-12-05 ENCOUNTER — Encounter: Payer: Self-pay | Admitting: Cardiology

## 2017-12-07 ENCOUNTER — Other Ambulatory Visit: Payer: Self-pay | Admitting: Pharmacist

## 2017-12-07 ENCOUNTER — Ambulatory Visit (INDEPENDENT_AMBULATORY_CARE_PROVIDER_SITE_OTHER): Payer: Self-pay

## 2017-12-07 ENCOUNTER — Telehealth: Payer: Self-pay

## 2017-12-07 ENCOUNTER — Telehealth: Payer: Self-pay | Admitting: Pulmonary Disease

## 2017-12-07 DIAGNOSIS — I5022 Chronic systolic (congestive) heart failure: Secondary | ICD-10-CM

## 2017-12-07 DIAGNOSIS — Z95810 Presence of automatic (implantable) cardiac defibrillator: Secondary | ICD-10-CM

## 2017-12-07 NOTE — Patient Outreach (Signed)
[place] [facility]) Care Management  12/07/2017  Veronica Shaw 12/14/1950 [id number]   Patient was called regarding medication assistance. HIPAA identifiers were obtained.  Patient said she had not received the patient assistance application we sent to her.  She also said she was on her way to [place] to visit family for Mother's Day and would be gone for over a week.   Plan: Patient will be followed by Etter Sjogren, CPhT   Elayne Guerin, PharmD, [place] Clinical Pharmacist [phone]

## 2017-12-07 NOTE — Telephone Encounter (Signed)
Received patient assistance forms for Dulera and Ventolin from [facility] care management.  Forms have been completed and mailed back to provided address. Nothing further is needed at this time.

## 2017-12-07 NOTE — Telephone Encounter (Signed)
Remote ICM transmission received.  Attempted call to patient and left detailed message, per DPR, regarding transmission and next ICM scheduled for 12/18/2017.  Advised to return call for any fluid symptoms or questions.

## 2017-12-07 NOTE — Progress Notes (Signed)
EPIC Encounter for ICM Monitoring  Patient Name: Veronica Shaw is a 67 y.o. female Date: 12/07/2017 Primary Care Physican: Rogue Bussing, MD Primary Cardiologist:Cooper/Weaver PA Electrophysiologist: Lovena Le Dry Weight:131lbs       Attempted call to patient and unable to reach.  Left detailed message, per DPR, regarding transmission.  Transmission reviewed.    Thoracic impedance abnormal suggesting fluid accumulation.  Prescribed dosage: Furosemide 20 mg 1 tablet daily as needed. Potassium 10 mEq 1 tablet daily as needed (taken with Lasix).   Labs: 11/30/2017 Creatinine 1.45, BUN 22, Potassium 5.3, Sodium 140, EGFR 37-43 10/19/2017 Creatinine1.16, BUN18, Potassium4.1, Sodium139, [id number] 10/18/2017 Creatinine1.24, BUN17, Potassium4.4, Sodium139, [id number]  10/17/2017 Creatinine1.46, BUN23, Potassium4.8, Sodium135, [id number]  10/15/2017 Creatinine1.61, BUN24, Potassium4.0, [id number], [id number] 04/07/2017 Creatinine 1.18, BUN 23, Potassium 4.1, Sodium 142, EGFR 46-54 04/04/2017 Creatinine 1.04, BUN 21, Potassium 4.6, Sodium 143, EGFR 55-64 01/25/2017 Creatinine 1.17, BUN 22, Potassium 4.5, Sodium 142, EGFR 48-56 12/28/2016 Creatinine 1.05, BUN 20, Potassium 4.2, Sodium 143, EGFR 55-64, NT-Pro BNP 244 12/21/2016 Creatinine 1.33, BUN 24, Potassium 5.1, Sodium 140 07/21/2016 Creatinine 1.18, BUN 17, Potassium 4.1, Sodium 139, EGFR 48-55  Recommendations: Patient on family vacation 12/06/2017 through 12/13/2017.   Follow-up plan: ICM clinic phone appointment on 12/18/2017 (manual send).    Copy of ICM check sent to Dr. Lovena Le.   3 month ICM trend: 12/07/2017    1 Year ICM trend:       [name], RN 12/07/2017 12:08 PM

## 2017-12-11 ENCOUNTER — Other Ambulatory Visit: Payer: Self-pay | Admitting: Pharmacy Technician

## 2017-12-11 NOTE — Patient Outreach (Signed)
[place] [facility]) Care Management  12/11/2017  Veronica Shaw [birth year] [id number]   Did not place call to patient, upon review of encounters, RPh noted that patient will be in [place] for the week.  Will follow up with patient about receiving applications on 18/34  Maud Deed. [place], [place] Management [phone]

## 2017-12-12 ENCOUNTER — Other Ambulatory Visit: Payer: Self-pay | Admitting: Pharmacy Technician

## 2017-12-12 NOTE — Patient Outreach (Addendum)
[facility] [facility]) Care Management  12/12/2017  Veronica Shaw 06-27-1951 [id number]  Successful return call to patient in regards to application she received. Patient asked about portions that she needed to fill out. She asked if I could fill it out for her and I informed her that if she sent a copy of her income and insurance card, I would. Patient stated she would send copies of items and mail it back in today or tomorrow.  Will follow up with patient in 7-10 business days if application has not been returned.  Maud Deed [place], [place] Management [phone]

## 2017-12-14 LAB — CUP PACEART REMOTE DEVICE CHECK
Battery Remaining Longevity: 78 mo
Battery Voltage: 2.99 V
Brady Statistic AP VP Percent: 0 %
Brady Statistic AP VS Percent: 0 %
Brady Statistic AS VP Percent: 0.03 %
Brady Statistic AS VS Percent: 99.96 %
Brady Statistic RA Percent Paced: 0.01 %
Brady Statistic RV Percent Paced: 0.03 %
Date Time Interrogation Session: 20190502052504
HighPow Impedance: 80 Ohm
Implantable Lead Implant Date: 20150427
Implantable Lead Implant Date: 20150427
Implantable Lead Location: 753859
Implantable Lead Location: 753860
Implantable Lead Model: 5076
Implantable Lead Model: 6935
Implantable Pulse Generator Implant Date: 20150427
Lead Channel Impedance Value: 399 Ohm
Lead Channel Impedance Value: 4047 Ohm
Lead Channel Impedance Value: 4047 Ohm
Lead Channel Impedance Value: 4047 Ohm
Lead Channel Impedance Value: 551 Ohm
Lead Channel Impedance Value: 646 Ohm
Lead Channel Pacing Threshold Amplitude: 0.5 V
Lead Channel Pacing Threshold Amplitude: 0.625 V
Lead Channel Pacing Threshold Pulse Width: 0.4 ms
Lead Channel Pacing Threshold Pulse Width: 0.4 ms
Lead Channel Sensing Intrinsic Amplitude: 10.875 mV
Lead Channel Sensing Intrinsic Amplitude: 10.875 mV
Lead Channel Sensing Intrinsic Amplitude: 2.625 mV
Lead Channel Sensing Intrinsic Amplitude: 2.625 mV
Lead Channel Setting Pacing Amplitude: 2 V
Lead Channel Setting Pacing Amplitude: 2.5 V
Lead Channel Setting Pacing Pulse Width: 0.4 ms
Lead Channel Setting Sensing Sensitivity: 0.3 mV

## 2017-12-15 ENCOUNTER — Other Ambulatory Visit: Payer: Self-pay

## 2017-12-15 NOTE — Patient Outreach (Signed)
Transition of care call: Placed call to patient for follow up . No answer.  PLAN: left a message requesting a call back. Will continue to outreach.  Tomasa Rand, RN, BSN, CEN [facility] [employer] [phone]

## 2017-12-18 ENCOUNTER — Telehealth: Payer: Self-pay

## 2017-12-18 ENCOUNTER — Ambulatory Visit (INDEPENDENT_AMBULATORY_CARE_PROVIDER_SITE_OTHER): Payer: Self-pay

## 2017-12-18 DIAGNOSIS — I5022 Chronic systolic (congestive) heart failure: Secondary | ICD-10-CM

## 2017-12-18 DIAGNOSIS — Z95810 Presence of automatic (implantable) cardiac defibrillator: Secondary | ICD-10-CM

## 2017-12-18 NOTE — Progress Notes (Signed)
EPIC Encounter for ICM Monitoring  Patient Name: Veronica Shaw is a 67 y.o. female Date: 12/18/2017 Primary Care Physican: Rogue Bussing, MD Primary Cardiologist:Cooper/Weaver PA Electrophysiologist: Lovena Le Dry Weight:130lbs      Heart Failure questions reviewed, pt  Today. She had lot of lower extremity swelling after she got off the plane when she traveled to [place] for mothers day but has resolved since she has been home.   Thoracic impedance abnormal suggesting fluid accumulation.  Prescribed dosage: Furosemide 40 mg Take 0.5 tablets (20 mg total) by mouth daily as needed for fluid (For swelling).  Labs: 11/30/2017 Creatinine 1.45, BUN 22, Potassium 5.3, Sodium 140, EGFR 37-43 10/19/2017 Creatinine1.16, BUN18, Potassium4.1, Sodium139, [id number] 10/18/2017 Creatinine1.24, BUN17, Potassium4.4, Sodium139, [id number]  10/17/2017 Creatinine1.46, BUN23, Potassium4.8, Sodium135, [id number]  10/15/2017 Creatinine1.61, BUN24, Potassium4.0, HERDEY814, [id number] 04/07/2017 Creatinine 1.18, BUN 23, Potassium 4.1, Sodium 142, EGFR 46-54 04/04/2017 Creatinine 1.04, BUN 21, Potassium 4.6, Sodium 143, EGFR 55-64 01/25/2017 Creatinine 1.17, BUN 22, Potassium 4.5, Sodium 142, EGFR 48-56 12/28/2016 Creatinine 1.05, BUN 20, Potassium 4.2, Sodium 143, EGFR 55-64, NT-Pro BNP 244 12/21/2016 Creatinine 1.33, BUN 24, Potassium 5.1, Sodium 140 07/21/2016 Creatinine 1.18, BUN 17, Potassium 4.1, Sodium 139, EGFR 48-55  Recommendations: Advised to take PRN Furosemide x 2 days and then return to PRN.   Follow-up plan: ICM clinic phone appointment on 12/21/2017.   Copy of ICM check sent to Dr. Lovena Le and Dr. Burt Knack.   3 month ICM trend: 12/18/2017    1 Year ICM trend:       [name], RN 12/18/2017 3:25 PM

## 2017-12-18 NOTE — Telephone Encounter (Signed)
Patient left message for PCP that she checked with her insurance and a breast MRI is covered. She would like this to be ordered.  Call back is [phone].  Danley Danker, RN [facility] RN)

## 2017-12-19 ENCOUNTER — Other Ambulatory Visit: Payer: Self-pay

## 2017-12-19 NOTE — Patient Outreach (Signed)
Transition of care:  Placed call to patient who is states that she is doing well. Reports her trip was good but had increased swelling.  Reports she is taking extra fluid pills as suggested by RN.  PLAN: Home visit planned for 01/11/2018. Reviewed low salt diet and need for following the heart failure zones. Encouraged patient to call me sooner if needed.  Tomasa Rand, RN, BSN, CEN [facility] [employer] [phone]

## 2017-12-19 NOTE — Telephone Encounter (Signed)
Spoke with representative at [employer]. Provided patient's pacemaker serial number, and unfortunately it is not compatible with MRI. Called patient to let her know and recommended annual mammograms, which she will next be due for at the end of September. She expressed understanding.  Olene Floss, MD [place], PGY-3

## 2017-12-21 ENCOUNTER — Ambulatory Visit (INDEPENDENT_AMBULATORY_CARE_PROVIDER_SITE_OTHER): Payer: Self-pay

## 2017-12-21 DIAGNOSIS — Z95810 Presence of automatic (implantable) cardiac defibrillator: Secondary | ICD-10-CM

## 2017-12-21 DIAGNOSIS — I5022 Chronic systolic (congestive) heart failure: Secondary | ICD-10-CM

## 2017-12-22 ENCOUNTER — Encounter: Payer: Self-pay | Admitting: Pharmacist

## 2017-12-22 ENCOUNTER — Other Ambulatory Visit: Payer: Self-pay | Admitting: Pharmacy Technician

## 2017-12-22 NOTE — Progress Notes (Signed)
EPIC Encounter for ICM Monitoring  Patient Name: Veronica Shaw is a 67 y.o. female Date: 12/22/2017 Primary Care Physican: Rogue Bussing, MD Primary Cardiologist:Cooper/Weaver PA Electrophysiologist: Lovena Le Dry Weight:128lbs      Heart Failure questions reviewed, pt asymptomatic.   Thoracic impedance returned close to baseline normal after taking PRN furosemide.  Prescribed dosage: Furosemide 40 mg Take 0.5 tablets (20 mg total) by mouth daily as needed for fluid (For swelling).  Labs: 11/30/2017 Creatinine 1.45, BUN 22, Potassium 5.3, Sodium 140, EGFR 37-43 10/19/2017 Creatinine1.16, BUN18, Potassium4.1, Sodium139, [id number] 10/18/2017 Creatinine1.24, BUN17, Potassium4.4, Sodium139, [id number]  10/17/2017 Creatinine1.46, BUN23, Potassium4.8, Sodium135, [id number]  10/15/2017 Creatinine1.61, BUN24, Potassium4.0, [id number], [id number] 04/07/2017 Creatinine 1.18, BUN 23, Potassium 4.1, Sodium 142, EGFR 46-54 04/04/2017 Creatinine 1.04, BUN 21, Potassium 4.6, Sodium 143, EGFR 55-64 01/25/2017 Creatinine 1.17, BUN 22, Potassium 4.5, Sodium 142, EGFR 48-56 12/28/2016 Creatinine 1.05, BUN 20, Potassium 4.2, Sodium 143, EGFR 55-64, NT-Pro BNP 244 12/21/2016 Creatinine 1.33, BUN 24, Potassium 5.1, Sodium 140 07/21/2016 Creatinine 1.18, BUN 17, Potassium 4.1, Sodium 139, EGFR 48-55  Recommendations: No changes.   Encouraged to call for fluid symptoms.  Follow-up plan: ICM clinic phone appointment on 01/08/2018.   Copy of ICM check sent to Dr. Lovena Le.   3 month ICM trend: 12/21/2017    1 Year ICM trend:       [name], RN 12/22/2017 11:24 AM

## 2017-12-22 NOTE — Patient Outreach (Signed)
[place] [facility] Care Management  12/22/2017  Veronica Shaw 01/29/1951 [id number]   Late Entry:  Patient called me with questions about filling out [employer] patient assistance application. I received patient portion of application on 75/79 and mailed out complete application to [employer] patient assistance on 05/21.  Will follow up with [employer] in the next 2- weeks  [employer]. [place], [place] Management [phone]

## 2017-12-29 ENCOUNTER — Other Ambulatory Visit: Payer: Self-pay | Admitting: Pharmacy Technician

## 2017-12-29 ENCOUNTER — Ambulatory Visit: Payer: Medicare Other | Admitting: Neurology

## 2017-12-29 NOTE — Patient Outreach (Signed)
[place] [facility]) Care Management  12/29/2017  Veronica Shaw Jan 18, 1951 [id number]   Contacted [employer] patient assistance to check status of patient application for Proventil and Dulera. Nicole Kindred informed me that application had been received and attestation letter was mailed out to patient on 05/30.  Unsuccessful outreach call attempt #1, HIPAA compliant voicemail left  Will follow up with patient in 2-3 business days if my call is not returned.  Maud Deed [place], [place] Management [phone]

## 2018-01-04 ENCOUNTER — Other Ambulatory Visit: Payer: Self-pay | Admitting: Pharmacy Technician

## 2018-01-04 NOTE — Patient Outreach (Signed)
[place] [facility]) Care Management  01/04/2018  Veronica Shaw 1950/10/01 [id number]   Unsuccessful outreach call #2 to patient, HIPAA compliant voicemail left. In [employer] attestation letter that was mailed out to her on 05/30.  Will make 3rd call attempt to patient in 5-7 business days.  Maud Deed [place], [place] Management [phone]

## 2018-01-08 ENCOUNTER — Ambulatory Visit (INDEPENDENT_AMBULATORY_CARE_PROVIDER_SITE_OTHER): Payer: Medicare Other

## 2018-01-08 DIAGNOSIS — Z95810 Presence of automatic (implantable) cardiac defibrillator: Secondary | ICD-10-CM

## 2018-01-08 DIAGNOSIS — I5022 Chronic systolic (congestive) heart failure: Secondary | ICD-10-CM

## 2018-01-08 NOTE — Progress Notes (Signed)
EPIC Encounter for ICM Monitoring  Patient Name: Veronica Shaw is a 67 y.o. female Date: 01/08/2018 Primary Care Physican: Rogue Bussing, MD Primary Cardiologist:Cooper/Weaver PA Electrophysiologist: Lovena Le Dry Weight:Previous weight 128lbs       Attempted call to patient and unable to reach.  Left detailed message, per DPR, regarding transmission.  Transmission reviewed.    Thoracic impedance normal.  Prescribed dosage: Furosemide40 mgTake 0.5 tablets (20 mg total) by mouth daily as needed for fluid (For swelling).  Labs: 11/30/2017 Creatinine 1.45, BUN 22, Potassium 5.3, Sodium 140, EGFR 37-43 10/19/2017 Creatinine1.16, BUN18, Potassium4.1, Sodium139, [id number] 10/18/2017 Creatinine1.24, BUN17, Potassium4.4, Sodium139, [id number]  10/17/2017 Creatinine1.46, BUN23, Potassium4.8, Sodium135, [id number]  10/15/2017 Creatinine1.61, BUN24, Potassium4.0, [id number], [id number] 04/07/2017 Creatinine 1.18, BUN 23, Potassium 4.1, Sodium 142, EGFR 46-54 04/04/2017 Creatinine 1.04, BUN 21, Potassium 4.6, Sodium 143, EGFR 55-64 01/25/2017 Creatinine 1.17, BUN 22, Potassium 4.5, Sodium 142, EGFR 48-56 12/28/2016 Creatinine 1.05, BUN 20, Potassium 4.2, Sodium 143, EGFR 55-64, NT-Pro BNP 244 12/21/2016 Creatinine 1.33, BUN 24, Potassium 5.1, Sodium 140 07/21/2016 Creatinine 1.18, BUN 17, Potassium 4.1, Sodium 139, EGFR 48-55  Recommendations: Left voice mail with ICM number and encouraged to call if experiencing any fluid symptoms.  Follow-up plan: ICM clinic phone appointment on 02/08/2018.  Recall appt 05/26/2018 with Dr. Lovena Le and 06/19/2018 with Dr Burt Knack.  Copy of ICM check sent to Dr. Lovena Le.   3 month ICM trend: 01/08/2018    1 Year ICM trend:       [name], RN 01/08/2018 12:13 PM

## 2018-01-09 ENCOUNTER — Telehealth: Payer: Self-pay

## 2018-01-09 DIAGNOSIS — Z95810 Presence of automatic (implantable) cardiac defibrillator: Secondary | ICD-10-CM | POA: Diagnosis not present

## 2018-01-09 DIAGNOSIS — I5022 Chronic systolic (congestive) heart failure: Secondary | ICD-10-CM | POA: Diagnosis not present

## 2018-01-09 NOTE — Telephone Encounter (Signed)
Remote ICM transmission received.  Attempted call to patient and left detailed message, per DPR, regarding transmission and next ICM scheduled for 02/08/2018.  Advised to return call for any fluid symptoms or questions.    

## 2018-01-11 ENCOUNTER — Other Ambulatory Visit: Payer: Self-pay

## 2018-01-11 NOTE — Patient Outreach (Signed)
[facility]) Care Management   01/11/2018  Veronica Shaw 11/13/1950 [id number]  Veronica Shaw is an 66 y.o. female 11am. Arrived for home visit. Patient answered without incidence ambualting without difficulty Subjective: Patient reports increased congestion and shortness of breath for 1 month. Reports coughing up pale yellow sputum. Reports she does not think it is her heart.  Reports no weight gain. Weight range of 127-130.   Objective:  Awake and alert. Ambulating without difficulty Today's Vitals   01/11/18 1058 01/11/18 1100  BP: 102/60   Pulse: 80   Resp: 20   SpO2: 97%   Weight: 129 lb (58.5 kg)   PainSc:  0-No pain   Review of Systems  Constitutional: Positive for malaise/fatigue.  HENT: Negative.   Eyes:       Wears glasses  Respiratory: Positive for cough, sputum production and shortness of breath.   Gastrointestinal: Positive for nausea and vomiting.  Genitourinary: Negative.   Musculoskeletal: Positive for joint pain.  Skin:       Rash to face for 2 week. No itching. Red rash   Neurological: Negative.   Endo/Heme/Allergies: Negative.   Psychiatric/Behavioral: Negative.     Physical Exam  Constitutional: She is oriented to person, place, and time. She appears well-developed and well-nourished.  Cardiovascular: Normal rate, normal heart sounds and intact distal pulses.  Respiratory: Effort normal and breath sounds normal.  GI: Soft. Bowel sounds are normal.  Musculoskeletal: Normal range of motion. She exhibits no edema.  Neurological: She is alert and oriented to person, place, and time.  Skin: Skin is warm and dry.  Psychiatric: She has a normal mood and affect. Her behavior is normal. Judgment and thought content normal.    Encounter Medications:   Outpatient Encounter Medications as of 01/11/2018  Medication Sig Note  . albuterol (PROVENTIL HFA;VENTOLIN HFA) 108 (90 Base) MCG/ACT inhaler Inhale 2 puffs into the lungs every 4 (four) hours  as needed for wheezing or shortness of breath.   [name] Kitchen albuterol (PROVENTIL) (2.5 MG/3ML) 0.083% nebulizer solution Take 3 mLs (2.5 mg total) by nebulization every 6 (six) hours as needed for wheezing or shortness of breath.   . Biotin 5000 MCG CAPS Take 1 tablet by mouth daily.   . Calcium Carb-Cholecalciferol 600-800 MG-UNIT TABS Take 1 tablet by mouth daily.   . carbidopa-levodopa (SINEMET IR) 25-100 MG tablet Take 1 tablet by mouth 3 (three) times daily.   . Coenzyme Q10 (CO Q-10) 100 MG CHEW Chew 1 Dose by mouth daily.   . furosemide (LASIX) 40 MG tablet Take 0.5 tablets (20 mg total) by mouth daily as needed for fluid (For swelling). 12/04/2017: Takes about 2 times per week  . gabapentin (NEURONTIN) 100 MG capsule Take 1 capsule (100 mg total) by mouth at bedtime. Take as needed.   . Lactobacillus (PROBIOTIC ACIDOPHILUS PO) Take 1 tablet by mouth daily.   . metFORMIN (GLUCOPHAGE) 500 MG tablet TAKE 2 TABLETS TWICE DAILY WITH A MEAL (Patient taking differently: TAKE 1000 mg TABLETS TWICE DAILY WITH A MEAL)   . metoprolol succinate (TOPROL-XL) 50 MG 24 hr tablet Take 1 tablet (50 mg total) by mouth 2 (two) times daily.   . mometasone-formoterol (DULERA) 100-5 MCG/ACT AERO Inhale 2 puffs into the lungs 2 (two) times daily.   . Multiple Vitamins-Minerals (OCUVITE ADULT 50+ PO) Take 1 tablet by mouth daily.   . potassium chloride (K-DUR,KLOR-CON) 10 MEQ tablet TAKE 1 TABLET DAILY AS NEEDED (TAKE WITH LASIX AS NEEDED  FOR SWELLING). (Patient taking differently: TAKE 10 mg  TABLET DAILY AS NEEDED (TAKE WITH LASIX AS NEEDED FOR SWELLING).)   . ranitidine (ZANTAC) 150 MG tablet Take 1 tablet (150 mg total) by mouth 2 (two) times daily. (Patient taking differently: Take 150 mg by mouth as needed. )   . Turmeric 500 MG TABS Take 1 tablet by mouth daily.   . benzonatate (TESSALON) 200 MG capsule Take 1 capsule (200 mg total) by mouth 3 (three) times daily as needed for cough. (Patient not taking: Reported on  12/04/2017)   . losartan (COZAAR) 25 MG tablet Take 1 tablet (25 mg total) by mouth daily. 01/11/2018: Is currently taking but hold if BP is lower than 732 systolic  . spironolactone (ALDACTONE) 25 MG tablet Take 0.5 tablets (12.5 mg total) by mouth daily. 01/11/2018: Currently taking daily   No facility-administered encounter medications on file as of 01/11/2018.     Functional Status:   In your present state of health, do you have any difficulty performing the following activities: 11/10/2017 10/18/2017  Hearing? N N  Vision? Y N  Comment wears glasses.   has cataracts -  Difficulty concentrating or making decisions? Y N  Walking or climbing stairs? Y N  Dressing or bathing? N Y  Doing errands, shopping? Y N  Comment goes with someone -  Conservation officer, nature and eating ? N -  Using the Toilet? Y -  Comment uses a bedside potty at night -  In the past six months, have you accidently leaked urine? Y -  Do you have problems with loss of bowel control? Y -  Managing your Medications? N -  Managing your Finances? N -  Housekeeping or managing your Housekeeping? N -  Some recent data might be hidden    Fall/Depression Screening:    Fall Risk  11/30/2017 11/10/2017 10/09/2017  Falls in the past year? Yes Yes Yes  Comment - - -  Number falls in past yr: 2 or more 2 or more 2 or more  Injury with Fall? No No No  Comment - - -  Risk Factor Category  High Fall Risk High Fall Risk High Fall Risk  Risk for fall due to : - History of fall(s) -  Follow up - Falls evaluation completed Falls evaluation completed   PHQ 2/9 Scores 11/30/2017 11/10/2017 11/03/2017 10/27/2017 05/30/2017 01/18/2017 12/22/2016  PHQ - 2 Score 0 0 0 0 0 0 0  PHQ- 9 Score - - - - - - -  Exception Documentation - - - - - - -    Assessment:   (1) heart failure: Weighing daily and following low salt diet. Take all medications as prescribed. (2) asthma:  Complains of shortness of breath especially when it is warm. Husband does not like to  use the air conditioner. No recent appointment or pending follow up with pulmonary.  Plan:  (1) encouraged patient to continue to take her medications as prescribed. Reviewed heart failure zones. (2) reviewed symptoms with patient. Encouraged patent to make a follow up appointment with pulmonary asap.  Reviewed community case closure as patient is doing well and would benefit from health coach. Order placed for health coach for COPD and heart failure follow up.  In basket message sent to [facility] team members to advise of nursing case closure.    [facility] CM Care Plan Problem One     Most Recent Value  Care Plan Problem One  Recent admission for falls and syncopy.  Role Documenting the Problem One  Care Management [employer] for Problem One  Active  [facility] Long Term Goal   Patient will report no readmission for falls or hypotension in the next 90 days.   [facility] Long Term Goal Start Date  11/10/17  Interventions for Problem One Long Term Goal  home visit completed. Reviewed heart failure zones and COPD zones with paitient and encouraged patient make follow  up appointments . WIll transfer to health coach.   [facility] CM Short Term Goal #1   Patient will reports weighing and recording weight daily for the next 30 days.   [facility] CM Short Term Goal #1 Start Date  11/10/17  [facility] CM Short Term Goal #1 Met Date  12/19/17  [facility] CM Short Term Goal #2   Patient will reports monitoring and recording daily BP in [facility] calendar for the next 30 days  THN CM Short Term Goal #2 Start Date  11/10/17  [facility] CM Short Term Goal #2 Met Date  12/19/17  [facility] CM Short Term Goal #3  Patient will monitor and record CBG readings daily for the next 30 days.  [facility] CM Short Term Goal #3 Start Date  11/10/17  [facility] CM Short Term Goal #3 Met Date  12/19/17      Tomasa Rand, RN, BSN, CEN [place] Coordinator [phone]

## 2018-01-12 ENCOUNTER — Other Ambulatory Visit: Payer: Self-pay | Admitting: Pharmacy Technician

## 2018-01-12 NOTE — Patient Outreach (Addendum)
[facility]) Care Management  01/12/2018  Veronica Shaw 02-Sep-1950 [id number]   Unsuccessful outreach call attempt #3 in reference to [employer] patient assistance attestation letter. HIPAA complaint voicemail left.  Will route not to [place] for case closure  Maud Deed. [place], [place] Management [phone]   ADDENDUM 11:01 am  Incoming call from patient, HIPAA identifiers verified. Patient states she received attestation letter from [employer] patient assistance and was under the impression she was denied. I informed patient  There are a series of questions that require to be answered. Helped patient answer required questions on letter. Patient states that she will put in mail today.  Will follow up with [employer] in 7-10 business days.  Maud Deed [place], [place] Management [phone]

## 2018-01-15 ENCOUNTER — Encounter: Payer: Self-pay | Admitting: *Deleted

## 2018-01-23 NOTE — Progress Notes (Signed)
Veronica Shaw was seen today in the movement disorders clinic for neurologic consultation at the request of Ola Spurr Sharman Cheek, MD.  The patient has previously been following with Dr. Kyra Searles and her physician assistant, although she has not been seen at [facility] since March, 2015 and has not been seen by Dr. Hall Busing since October, 2014.  I have reviewed [facility] records.  She was first seen by [facility] in August, 2014 and was diagnosed with probable Parkinson's disease and was started on levodopa at that visit.  Her complaints at that time were pain all over, loss of balance, and right hand tremor.  When she followed up in October, 2014 she was only taking the levodopa twice a day, but UPDRS motor score had greatly improved.  She was told to increase the carbidopa/levodopa 25/100-1-1/2 tablets 3 times a day, but when she followed up in March, 2015 she did not think that that made a significant difference.  She was told to continue the medication.  As above, she has not been seen back by the neurology clinic at [facility] since that time.  She is off of carbidopa/levodopa as she doesn't think that she has parkinsons disease.  She states that she has been off of the medication for the last 2 years.  She does state that she often goes to the right side and will lose balance    12/22/15 update:  The patient follows up today regarding Parkinson's disease.  She was started on pramipexole 0.5 mg and supposed to work up to tid.  She admits that she didn't take it.  Her sister told her not to.  She states that she "told me before that she tried it" but I reminded her that she tried levodopa but only took it bid and then she d/c it.  She was worried about dyskinesia.  She states that she went to a prayer meeting and thinks that she may be cured.  She admits that she occasionally uses a cane because she goes to the right.    She denies any compulsive behaviors.  Denies sleep attacks.  No falls.  No hallucinations.   No lightheadedness or near syncope.  I did review records since last visit.  She underwent a right heart cath on 12/16/2015.  There was low filling pressures, but normal cardiac output.  Dr. Burt Knack suspected her shortness of breath secondary to asthma.  She states that has limited her exercise activities.    07/01/16 update:  The patient follows up today regarding Parkinson's disease.  She refuses any medication for her disease.  Reports that she has had a few falls but "I have not had any broken bones."  She reports that she takes coconut oil now as she heard it was good for her on the news.  No hallucinations, lightheadedness or near syncope.  I have reviewed her records since our last visit.  No new cardiac issues, although she continues to follow with cardiology.  She does have an ICD.  She is having some headaches in the temporal region 2 times per week and last one hour.  It feels throbbing.  Bilateral temporal.  Go away on own without med.   No longer taking neurontin.  She is no longer going to gym but is exercising some at home.  Gym exercise made her asthma worse.  She is now raising her 1 year old grandson.    12/30/16 update:  Patient seen today in follow-up for her Parkinson's disease.  She has refused medicine for quite some time for her Parkinson's disease.  Pt denies falls.  Having some near falls.  Pt having some dizziness but told that she had fluid in her ear and has appt next week with ENT.   "I know its my ear because I have ear pain." She has had no near syncope.  No hallucinations.  Mood has been good.   Going to the gym and "I do some granny exercises but they work [place] out."  C/o R shoulder pain - states that her sciatica has moved to her shoulder.  10/09/17 update: Patient is seen today in follow-up for Parkinson's disease.  I have not seen her in 9 months.  She has previously refused all medication.  Records have been reviewed since last visit.  She brings a long list of sx's consisting of  leg cramping, confusion, constipation, tripping, and a "dopey feeling in my head."  Takes yellow mustard for cramping.  She has continued to follow-up with cardiology for her congestive heart failure.  She is having more back pain.  She saw neurosx in the past but didn't have insurance then.  She is not on a schedule.  She is sometimes sleeping all day and sometimes having trouble with sleep at night.  Using melatonin.    01/24/18 update: Patient is seen today in follow-up for Parkinson's disease.  She was restarted on carbidopa/levodopa 25/100 last visit and worked to 1 tablet 3 times per day.  She reports that she is taking that 8am/noon/4pm.     Pt denies lightheadedness, near syncope.  No hallucinations.  Mood has been good.  Records have been reviewed since our last visit.  She was in the hospital not long after I saw her.  She had several syncopal episodes that was felt secondary to dehydration/influenza.  CT of the brain was performed as the patient hit her head when she fell and she had some confusion.  This was unremarkable.  She had a hard time recovering from the hospital stay and still feels weak.  She is no longer doing the [employer] exercise "granny group" as "I couldn't keep up."  Having significant nighttime leg cramping.  Having runny nose.     PREVIOUS MEDICATIONS: Sinemet (pt was only taking bid and didn't think helped); mirapex (given but didn't take)  ALLERGIES:   Allergies  Allergen Reactions  . Aspirin Swelling and Other (See Comments)    Other reaction(s): Other (See Comments) Causes nose bleeds *ONLY THE COATED ASA* Causes nose bleeds *ONLY THE COATED ASA*  . Penicillins Shortness Of Breath and Rash    Shortness of Breath - Throat felt like it was closing.   [name] [Conj Estrog-Medroxyprogest Ace] Shortness Of Breath    Throat swelling Throat swelling  . Ace Inhibitors Cough  . Simvastatin Other (See Comments) and Rash    Muscle aches    CURRENT MEDICATIONS:    Outpatient Encounter Medications as of 01/24/2018  Medication Sig  . albuterol (PROVENTIL HFA;VENTOLIN HFA) 108 (90 Base) MCG/ACT inhaler Inhale 2 puffs into the lungs every 4 (four) hours as needed for wheezing or shortness of breath.  [name] Kitchen albuterol (PROVENTIL) (2.5 MG/3ML) 0.083% nebulizer solution Take 3 mLs (2.5 mg total) by nebulization every 6 (six) hours as needed for wheezing or shortness of breath.  . benzonatate (TESSALON) 200 MG capsule Take 1 capsule (200 mg total) by mouth 3 (three) times daily as needed for cough. (Patient not taking: Reported on 12/04/2017)  .  Biotin 5000 MCG CAPS Take 1 tablet by mouth daily.  . Calcium Carb-Cholecalciferol 600-800 MG-UNIT TABS Take 1 tablet by mouth daily.  . carbidopa-levodopa (SINEMET CR) 50-200 MG tablet Take 1 tablet by mouth at bedtime.  . carbidopa-levodopa (SINEMET IR) 25-100 MG tablet Take 1 tablet by mouth 3 (three) times daily.  . Coenzyme Q10 (CO Q-10) 100 MG CHEW Chew 1 Dose by mouth daily.  . furosemide (LASIX) 40 MG tablet Take 0.5 tablets (20 mg total) by mouth daily as needed for fluid (For swelling).  . gabapentin (NEURONTIN) 100 MG capsule Take 1 capsule (100 mg total) by mouth at bedtime. Take as needed.  . Lactobacillus (PROBIOTIC ACIDOPHILUS PO) Take 1 tablet by mouth daily.  [name] Kitchen losartan (COZAAR) 25 MG tablet Take 1 tablet (25 mg total) by mouth daily.  . metFORMIN (GLUCOPHAGE) 500 MG tablet TAKE 2 TABLETS TWICE DAILY WITH A MEAL (Patient taking differently: TAKE 1000 mg TABLETS TWICE DAILY WITH A MEAL)  . metoprolol succinate (TOPROL-XL) 50 MG 24 hr tablet Take 1 tablet (50 mg total) by mouth 2 (two) times daily.  . mometasone-formoterol (DULERA) 100-5 MCG/ACT AERO Inhale 2 puffs into the lungs 2 (two) times daily.  . Multiple Vitamins-Minerals (OCUVITE ADULT 50+ PO) Take 1 tablet by mouth daily.  . potassium chloride (K-DUR,KLOR-CON) 10 MEQ tablet TAKE 1 TABLET DAILY AS NEEDED (TAKE WITH LASIX AS NEEDED FOR SWELLING). (Patient  taking differently: TAKE 10 mg  TABLET DAILY AS NEEDED (TAKE WITH LASIX AS NEEDED FOR SWELLING).)  . ranitidine (ZANTAC) 150 MG tablet Take 1 tablet (150 mg total) by mouth 2 (two) times daily. (Patient taking differently: Take 150 mg by mouth as needed. )  . spironolactone (ALDACTONE) 25 MG tablet Take 0.5 tablets (12.5 mg total) by mouth daily.  . Turmeric 500 MG TABS Take 1 tablet by mouth daily.   No facility-administered encounter medications on file as of 01/24/2018.     PAST MEDICAL HISTORY:   Past Medical History:  Diagnosis Date  . Asthma   . Chronic systolic CHF (congestive heart failure) ([place])    a. cMRI 4/15: EF 34% and findings - c/w NICM, normal RV size and function (RVEF 61%), Mild MR // b. Echo 2/15:  EF 30-35%, diff HK, ant-sept AK, Gr 2 DD, mild MR, trivial TR  //  c. Echo 5/17: EF 20-25%, severe diffuse HK, marked systolic dyssynchrony, grade 1 diastolic dysfunction, mild MR  //  d. RHC 5/17: Fick CO 2.9, RVSP 19, PASP 15, PW mean 2, low filing pressures and preserved CO   . Diabetes mellitus   . Gastritis   . History of echocardiogram    Echo 6/18: EF 30-35, diffuse HK, grade 1 diastolic dysfunction, trivial MR, mild LAE, mild TR, no pericardial effusion  . History of nuclear stress test    Myoview 5/18: EF 49, no ischemia, inferoseptal defect c/w LBBB artifact (intermediate risk due to EF < 50).  [name] Kitchen HTN (hypertension)   . Hyperlipidemia   . NICM (nonischemic cardiomyopathy) ([place])    a. Nuclear 5/13: Normal stress nuclear study. LV Ejection Fraction: 58%  //  b. LHC 10/14: Minor luminal irregularity in prox LAD, EF 35%   . Parkinson disease ([place])   . Plantar fasciitis   . Sleep apnea    was retested and no longer had it and so d/c CPAP  . Urticaria     PAST SURGICAL HISTORY:   Past Surgical History:  Procedure Laterality Date  . BUNIONECTOMY    .  CARDIAC CATHETERIZATION N/A 12/16/2015   Procedure: Right Heart Cath;  Surgeon: Sherren Mocha, MD;  Location: [place] CV LAB;  Service: Cardiovascular;  Laterality: N/A;  . CHOLECYSTECTOMY    . IMPLANTABLE CARDIOVERTER DEFIBRILLATOR IMPLANT  11-25-13   MDT dual chamber ICD implanted by [name] for primary prevention  . IMPLANTABLE CARDIOVERTER DEFIBRILLATOR IMPLANT N/A 11/25/2013   Procedure: IMPLANTABLE CARDIOVERTER DEFIBRILLATOR IMPLANT;  Surgeon: [name], MD;  Location: [facility] CATH LAB;  Service: Cardiovascular;  Laterality: N/A;  . LEFT AND RIGHT HEART CATHETERIZATION WITH CORONARY ANGIOGRAM N/A 05/31/2013   Procedure: LEFT AND RIGHT HEART CATHETERIZATION WITH CORONARY ANGIOGRAM;  Surgeon: [name], MD;  Location: [facility] CATH LAB;  Service: Cardiovascular;  Laterality: N/A;  . TONSILLECTOMY    . TUBAL LIGATION      SOCIAL HISTORY:   Social History   Socioeconomic History  . Marital status: Married    Spouse name: Not on file  . Number of children: 2  . Years of education: Not on file  . Highest education level: Not on file  Occupational History  . Occupation: retired    Comment: Proofreader  . Financial resource strain: Not on file  . Food insecurity:    Worry: Not on file    Inability: Not on file  . Transportation needs:    Medical: Not on file    Non-medical: Not on file  Tobacco Use  . Smoking status: Never Smoker  . Smokeless tobacco: Never Used  . Tobacco comment: exposed to smoke during child hood (parents)  Substance and Sexual Activity  . Alcohol use: No    Alcohol/week: 0.0 oz  . Drug use: No  . Sexual activity: Yes  Lifestyle  . Physical activity:    Days per week: Not on file    Minutes per session: Not on file  . Stress: Not on file  Relationships  . Social connections:    Talks on phone: Not on file    Gets together: Not on file    Attends religious service: Not on file    Active member of club or organization: Not on file    Attends meetings of clubs or organizations: Not on file    Relationship status: Not on file  . Intimate partner  violence:    Fear of current or ex partner: Not on file    Emotionally abused: Not on file    Physically abused: Not on file    Forced sexual activity: Not on file  Other Topics Concern  . Not on file  Social History Narrative   Lives at home with husband and the dog.    FAMILY HISTORY:   Family Status  Relation Name Status  . Father  Deceased       MI, heart disease  . Mother  Deceased       MI, stroke, DM  . MGM  Deceased  . MGF  Deceased  . PGM  Deceased  . PGF  Deceased  . Sister  Alive        heart disease x3  . Son  Alive       DM  . Daughter  Alive       healthy  . Sister  Alive  . Sister  Alive  . Neg Hx  (Not Specified)    ROS:   Review of Systems  Constitutional: Negative.   HENT: Negative.   Respiratory: Negative.   Cardiovascular: Negative.   Gastrointestinal:  Positive for heartburn.  Genitourinary: Negative.   Musculoskeletal: Negative.     PHYSICAL EXAMINATION:    VITALS:   Vitals:   01/24/18 0913  BP: (!) 116/58  Pulse: 66  SpO2: 95%  Weight: 129 lb (58.5 kg)  Height: 5\' 2"  (1.575 m)   Wt Readings from Last 3 Encounters:  01/24/18 129 lb (58.5 kg)  01/11/18 129 lb (58.5 kg)  12/19/17 129 lb (58.5 kg)     GEN:  The patient appears stated age and is in NAD. HEENT:  Normocephalic, atraumatic.  The mucous membranes are moist. The superficial temporal arteries are without ropiness or tenderness. CV:  RRR Lungs:  CTAB Neck/HEME:  There are no carotid bruits bilaterally.  Neurological examination:  Orientation: alert and oriented x 3.  Montreal Cognitive Assessment  10/19/2015  Visuospatial/ Executive (0/5) 4  Naming (0/3) 3  Attention: Read list of digits (0/2) 2  Attention: Read list of letters (0/1) 1  Attention: Serial 7 subtraction starting at 100 (0/3) 0  Language: Repeat phrase (0/2) 2  Language : Fluency (0/1) 0  Abstraction (0/2) 2  Delayed Recall (0/5) 0  Orientation (0/6) 6  Total 20  Adjusted Score (based on  education) 21    Cranial nerves: There is good facial symmetry.  There is facial hypomimia.  The speech is fluent and clear.  She is hypophonic.  Soft palate rises symmetrically and there is no tongue deviation. Hearing is intact to conversational tone. Sensation: Sensation is intact to light touch throughout Motor: Strength is at least antigravity x4.  Movement examination: Tone: There is mildly increased in the bilateral upper extremities. Abnormal movements: There is intermittent tremor in the right upper extremity. Coordination:  There is mild decremation with RAM's, with most upper extremity rapid alternating movements bilaterally. Gait and Station: The patient pushes off of the chair.  She is not dragging her leg today.  She is somewhat slow.  She does use her cane.     ASSESSMENT/PLAN:  1.  Idiopathic Parkinson's disease.  The patient has tremor, bradykinesia, rigidity and mild postural instability.  Dx was made in 03/2013 at [facility].    -previously given mirapex but patient d/c it.   -He is now taking levodopa faithfully.  I think she needs more, but she has been very medication resistant and noncompliant in the past and I just got her to start taking it so I did not change it today.  -She is having a lot of nighttime feet cramping, which is common when off of medication at night and is a sign for the need for dopamine at night.  She was agreeable to added carbidopa/levodopa 50/200 at bedtime.  -Talked to her again about participating in some of her Parkinson's programs.  Patient education was provided. 2.  Lumbar spinal stenosis  -Having more problems with back pain.  Told her to follow-up with her neurosurgeon. 3.  Syncope in March, 2019 as a result of influenza/dehydration  -Patient was confused while in the hospital.  Talked to her about getting neurocognitive testing.  She is doing better now.  She was not really confused in the office today.  Wants to hold. 4.  vasomotor  rhinorrhea  -Explained to her that this is associated with Parkinson's disease, but there is no good treatment. 5.  Follow-up with me in 4 months, sooner should new neurologic issues arise.

## 2018-01-24 ENCOUNTER — Ambulatory Visit: Payer: Medicare Other | Admitting: Pulmonary Disease

## 2018-01-24 ENCOUNTER — Other Ambulatory Visit: Payer: Self-pay | Admitting: Pharmacy Technician

## 2018-01-24 ENCOUNTER — Encounter: Payer: Self-pay | Admitting: Neurology

## 2018-01-24 ENCOUNTER — Ambulatory Visit (INDEPENDENT_AMBULATORY_CARE_PROVIDER_SITE_OTHER): Payer: Medicare Other | Admitting: Neurology

## 2018-01-24 VITALS — BP 116/58 | HR 66 | Ht 62.0 in | Wt 129.0 lb

## 2018-01-24 DIAGNOSIS — G20 Parkinson's disease: Secondary | ICD-10-CM | POA: Diagnosis not present

## 2018-01-24 DIAGNOSIS — J300 Vasomotor rhinitis: Secondary | ICD-10-CM

## 2018-01-24 MED ORDER — CARBIDOPA-LEVODOPA ER 50-200 MG PO TBCR: 1.0000 | EXTENDED_RELEASE_TABLET | Freq: Every day | ORAL | 1 refills | Status: DC

## 2018-01-24 NOTE — Patient Instructions (Addendum)
1.  Continue carbidopa/levodopa 25/100, 1 tablet at 8am/noon/4pm  2.  Start carbidopa/levodopa 50/200 at bedtime for cramping  Registration is OPEN!    Third Annual Parkinson's Education Symposium   To register: [email or web address]      Search:  [employer] person attending individually Questions: [place], [place] or [email or web address]

## 2018-01-24 NOTE — Patient Outreach (Signed)
[place] [facility]) Care Management  01/24/2018  Veronica Shaw May 10, 1951 [id number]   Follow up call to [employer] patient assistance to verify attestation letter has been received. Marisol confirmed that attestation letter has been received and that patient has been approved for [facility] and Proventil as of 06/19. She stated that the pharmacy is still processing the order.  Will follow up with pharmacy in 3-4 business days.  Maud Deed [place], [place] Management [phone]

## 2018-01-29 ENCOUNTER — Telehealth: Payer: Self-pay

## 2018-01-29 ENCOUNTER — Other Ambulatory Visit: Payer: Self-pay | Admitting: *Deleted

## 2018-01-29 DIAGNOSIS — I129 Hypertensive chronic kidney disease with stage 1 through stage 4 chronic kidney disease, or unspecified chronic kidney disease: Secondary | ICD-10-CM | POA: Diagnosis not present

## 2018-01-29 DIAGNOSIS — N183 Chronic kidney disease, stage 3 (moderate): Secondary | ICD-10-CM | POA: Diagnosis not present

## 2018-01-29 NOTE — Patient Outreach (Addendum)
[place] [facility] [facility]) Care Management  01/29/2018  Veronica Shaw [birth year] [id number]   RN Health Coach telephone call to patient.  [employer] compliance verified. Per patient she had just gotten back from a Dr appointment and is very tired and doesn't want to talk at this time. Patient stated she was told she has kidney failure and they want to do some more test. RN made patient aware that she can call back at another time or the patient can call her.  Plan: RN will call again within 10 business days.  [place] Care Management [phone]

## 2018-01-29 NOTE — Telephone Encounter (Signed)
Verdis Frederickson, student in Human resources officer at [employer], needs note faxed over that patient does not need any pre-medication for dental cleaning.   Fax to [phone]  Call back to [facility] for questions: [phone]  Danley Danker, RN [facility] RN)

## 2018-01-31 ENCOUNTER — Other Ambulatory Visit: Payer: Self-pay | Admitting: Nephrology

## 2018-01-31 DIAGNOSIS — I129 Hypertensive chronic kidney disease with stage 1 through stage 4 chronic kidney disease, or unspecified chronic kidney disease: Secondary | ICD-10-CM

## 2018-01-31 DIAGNOSIS — N183 Chronic kidney disease, stage 3 unspecified: Secondary | ICD-10-CM

## 2018-01-31 NOTE — Telephone Encounter (Signed)
Given that patient has ICD placed, would recommend antibiotic prophylaxis prior to dental procedure. If patient needs RX for antibiotics please let me know and I can send it to her desired pharmacy.   Dalphine Handing, PGY-2 [facility] 01/31/2018 12:25 PM

## 2018-02-02 NOTE — Telephone Encounter (Signed)
Attempted to call pt no answer will try again later. Junah Yam, CMA  

## 2018-02-05 ENCOUNTER — Other Ambulatory Visit: Payer: Medicare Other

## 2018-02-05 ENCOUNTER — Telehealth: Payer: Self-pay | Admitting: Pharmacist

## 2018-02-05 ENCOUNTER — Ambulatory Visit
Admission: RE | Admit: 2018-02-05 | Discharge: 2018-02-05 | Disposition: A | Discharge status: Home / Self Care | Payer: Medicare Other | Source: Ambulatory Visit | Attending: Nephrology | Admitting: Nephrology

## 2018-02-05 ENCOUNTER — Other Ambulatory Visit: Payer: Self-pay | Admitting: Pharmacy Technician

## 2018-02-05 DIAGNOSIS — N183 Chronic kidney disease, stage 3 unspecified: Secondary | ICD-10-CM

## 2018-02-05 DIAGNOSIS — I129 Hypertensive chronic kidney disease with stage 1 through stage 4 chronic kidney disease, or unspecified chronic kidney disease: Secondary | ICD-10-CM

## 2018-02-05 MED ORDER — CLINDAMYCIN HCL 300 MG PO CAPS
600.0000 mg | ORAL_CAPSULE | Freq: Once | ORAL | 0 refills | Status: AC
Start: 2018-02-05 — End: 2018-02-05

## 2018-02-05 NOTE — Telephone Encounter (Signed)
-----   Message from Adaline Sill, CPhT sent at 02/05/2018 11:37 AM EDT ----- Case closure

## 2018-02-05 NOTE — Telephone Encounter (Signed)
Patient called about this again. Dental appt is tomorrow.  Danley Danker, RN [facility] RN)

## 2018-02-05 NOTE — Telephone Encounter (Signed)
Pt returned call, please send in the antibiotic to [facility] on [address]. Please let her know when this is done.

## 2018-02-05 NOTE — Telephone Encounter (Signed)
Veronica Shaw called nurse line this morning checking the status of fax. Per chart review, I informed her we have tried calling the patient to inform of need for antibiotic. At this point we have not reached her. Veronica Shaw stated she will try and reach out to patient and have her call us for antibiotic information.

## 2018-02-05 NOTE — Patient Outreach (Signed)
[place] [facility]) Care Management  02/05/2018  NOCOLE ZAMMIT 09/11/1950 [id number]   Patient's case is being closed.  She received her medications from patient assistance programs. [place], Etter Sjogren confirmed the patient understands how to reorder her medications if necessary from the respective programs.   Plan: Close patient's pharmacy case   Elayne Guerin, PharmD, [place] Clinical Pharmacist [phone]

## 2018-02-05 NOTE — Patient Outreach (Signed)
[place] [facility]) Care Management  02/05/2018  RATASHA FABRE [birth year] [id number]   Successful outreach attempt, HIPAA identifiers verified. [name] stated that she received her Dulera and Proventil HFA inhaler in the mail on Saturday. We discussed how to obtain refills going forward.  Will route note to [place] for case closure.  Maud Deed [place], [place] Management [phone]

## 2018-02-05 NOTE — Telephone Encounter (Signed)
Spoke with preceptor, Dr. McDiarmid. Verbal order for Clindamycin 300 mg, # 2, take both as one dose,  one hour before procedure, sent to pharmacy.  Danley Danker, RN [facility] RN)

## 2018-02-07 DIAGNOSIS — E113291 Type 2 diabetes mellitus with mild nonproliferative diabetic retinopathy without macular edema, right eye: Secondary | ICD-10-CM | POA: Diagnosis not present

## 2018-02-07 DIAGNOSIS — H2513 Age-related nuclear cataract, bilateral: Secondary | ICD-10-CM | POA: Diagnosis not present

## 2018-02-07 DIAGNOSIS — H5203 Hypermetropia, bilateral: Secondary | ICD-10-CM | POA: Diagnosis not present

## 2018-02-08 ENCOUNTER — Ambulatory Visit (INDEPENDENT_AMBULATORY_CARE_PROVIDER_SITE_OTHER): Payer: Medicare Other

## 2018-02-08 DIAGNOSIS — Z95810 Presence of automatic (implantable) cardiac defibrillator: Secondary | ICD-10-CM | POA: Diagnosis not present

## 2018-02-08 DIAGNOSIS — I5022 Chronic systolic (congestive) heart failure: Secondary | ICD-10-CM | POA: Diagnosis not present

## 2018-02-09 NOTE — Progress Notes (Signed)
EPIC Encounter for ICM Monitoring  Patient Name: Veronica Shaw is a 67 y.o. female Date: 02/09/2018 Primary Care Physican: Caroline More, DO Primary Cardiologist:Cooper/Weaver PA Electrophysiologist: Lovena Le Dry Weight: 131lbs       Heart Failure questions reviewed, pt asymptomatic.   Thoracic impedance normal.  Prescribed dosage: Furosemide40 mgTake 0.5 tablets (20 mg total) by mouth daily as needed for fluid (For swelling).  Labs: 11/30/2017 Creatinine 1.45, BUN 22, Potassium 5.3, Sodium 140, EGFR 37-43 10/19/2017 Creatinine1.16, BUN18, Potassium4.1, Sodium139, PCHE03-52 10/18/2017 Creatinine1.24, BUN17, Potassium4.4, Sodium139, YELY59-09  10/17/2017 Creatinine1.46, BUN23, Potassium4.8, Sodium135, PJPE16-24  10/15/2017 Creatinine1.61, BUN24, Potassium4.0, ECXFQH225, [id number] 04/07/2017 Creatinine 1.18, BUN 23, Potassium 4.1, Sodium 142, EGFR 46-54 04/04/2017 Creatinine 1.04, BUN 21, Potassium 4.6, Sodium 143, EGFR 55-64 01/25/2017 Creatinine 1.17, BUN 22, Potassium 4.5, Sodium 142, EGFR 48-56 12/28/2016 Creatinine 1.05, BUN 20, Potassium 4.2, Sodium 143, EGFR 55-64, NT-Pro BNP 244  Recommendations: No changes.  Encouraged to call for fluid symptoms.  Follow-up plan: ICM clinic phone appointment on 03/12/2018.    Recall appts 05/26/2018 with Dr. Lovena Le and 06/19/2018 with Dr Burt Knack.  Copy of ICM check sent to Dr. Lovena Le.   3 month ICM trend: 02/08/2018    1 Year ICM trend:       [name], RN 02/09/2018 10:08 AM

## 2018-02-12 ENCOUNTER — Other Ambulatory Visit: Payer: Self-pay | Admitting: *Deleted

## 2018-02-16 NOTE — Patient Outreach (Signed)
[name]) Care Management  02/16/2018   Veronica Shaw March 02, 1951 [id number]  RN Health Coach telephone call to patient.  Hipaa compliance verified. Per patient she is doing good. Patient is weighing self and stated her weight is running 128-129 pounds. Patient knows the zones of CHF and action plan. Currently has no edema. Per patient she is on a low sodium diet. Patient has agreed to further outreach calls.  Current Medications:  Current Outpatient Medications  Medication Sig Dispense Refill  . albuterol (PROVENTIL HFA;VENTOLIN HFA) 108 (90 Base) MCG/ACT inhaler Inhale 2 puffs into the lungs every 4 (four) hours as needed for wheezing or shortness of breath. 18 g 3  . albuterol (PROVENTIL) (2.5 MG/3ML) 0.083% nebulizer solution Take 3 mLs (2.5 mg total) by nebulization every 6 (six) hours as needed for wheezing or shortness of breath. 90 mL 12  . benzonatate (TESSALON) 200 MG capsule Take 1 capsule (200 mg total) by mouth 3 (three) times daily as needed for cough. (Patient not taking: Reported on 12/04/2017) 30 capsule 0  . Biotin 5000 MCG CAPS Take 1 tablet by mouth daily.    . Calcium Carb-Cholecalciferol 600-800 MG-UNIT TABS Take 1 tablet by mouth daily. 90 tablet 3  . carbidopa-levodopa (SINEMET CR) 50-200 MG tablet Take 1 tablet by mouth at bedtime. 90 tablet 1  . carbidopa-levodopa (SINEMET IR) 25-100 MG tablet Take 1 tablet by mouth 3 (three) times daily. 270 tablet 1  . Coenzyme Q10 (CO Q-10) 100 MG CHEW Chew 1 Dose by mouth daily.    . furosemide (LASIX) 40 MG tablet Take 0.5 tablets (20 mg total) by mouth daily as needed for fluid (For swelling). 30 tablet 3  . gabapentin (NEURONTIN) 100 MG capsule Take 1 capsule (100 mg total) by mouth at bedtime. Take as needed. 30 capsule 0  . Lactobacillus (PROBIOTIC ACIDOPHILUS PO) Take 1 tablet by mouth daily.    [name] Kitchen losartan (COZAAR) 25 MG tablet Take 1 tablet (25 mg total) by mouth daily. 90 tablet 3  . metFORMIN  (GLUCOPHAGE) 500 MG tablet TAKE 2 TABLETS TWICE DAILY WITH A MEAL (Patient taking differently: TAKE 1000 mg TABLETS TWICE DAILY WITH A MEAL) 360 tablet 3  . metoprolol succinate (TOPROL-XL) 50 MG 24 hr tablet Take 1 tablet (50 mg total) by mouth 2 (two) times daily. 180 tablet 3  . mometasone-formoterol (DULERA) 100-5 MCG/ACT AERO Inhale 2 puffs into the lungs 2 (two) times daily. 1 Inhaler 5  . Multiple Vitamins-Minerals (OCUVITE ADULT 50+ PO) Take 1 tablet by mouth daily.    . potassium chloride (K-DUR,KLOR-CON) 10 MEQ tablet TAKE 1 TABLET DAILY AS NEEDED (TAKE WITH LASIX AS NEEDED FOR SWELLING). (Patient taking differently: TAKE 10 mg  TABLET DAILY AS NEEDED (TAKE WITH LASIX AS NEEDED FOR SWELLING).) 30 tablet 6  . ranitidine (ZANTAC) 150 MG tablet Take 1 tablet (150 mg total) by mouth 2 (two) times daily. (Patient taking differently: Take 150 mg by mouth as needed. ) 90 tablet 1  . spironolactone (ALDACTONE) 25 MG tablet Take 0.5 tablets (12.5 mg total) by mouth daily. 90 tablet 3  . Turmeric 500 MG TABS Take 1 tablet by mouth daily.     No current facility-administered medications for this visit.     Functional Status:  In your present state of health, do you have any difficulty performing the following activities: 02/12/2018 11/10/2017  Hearing? N N  Vision? Y Y  Comment - wears glasses.   has cataracts  Difficulty  concentrating or making decisions? [name]  Walking or climbing stairs? Y Y  Dressing or bathing? N N  Doing errands, shopping? Y Y  Comment - goes with someone  Conservation officer, nature and eating ? N N  Using the Toilet? Y Y  Comment - uses a bedside potty at night  In the past six months, have you accidently leaked urine? Y Y  Do you have problems with loss of bowel control? Y Y  Managing your Medications? N N  Managing your Finances? N N  Housekeeping or managing your Housekeeping? N N  Some recent data might be hidden    Fall/Depression Screening: Fall Risk  02/12/2018  01/24/2018 11/30/2017  Falls in the past year? Yes Yes Yes  Comment - - -  Number falls in past yr: 2 or more 2 or more 2 or more  Injury with Fall? Yes Yes No  Comment - - -  Risk Factor Category  High Fall Risk High Fall Risk High Fall Risk  Risk for fall due to : Impaired balance/gait;Impaired mobility;History of fall(s) - -  Follow up Falls evaluation completed;Falls prevention discussed - -   PHQ 2/9 Scores 02/12/2018 11/30/2017 11/10/2017 11/03/2017 10/27/2017 05/30/2017 01/18/2017  PHQ - 2 Score 0 0 0 0 0 0 0  PHQ- 9 Score - - - - - - -  Exception Documentation - - - - - - -   THN CM Care Plan Problem One     Most Recent Value  Care Plan Problem One  Knowledge Deficit in self management of chf  Role Documenting the Problem One  [place] for Problem One  Active  THN Long Term Goal   Patient will report no readmission for falls or hypotension in the next 90 days.   THN Long Term Goal Start Date  02/16/18  Interventions for Problem One Long Term Goal  RN reiterated heart zones and action plan. RN sent educational material on A matter choices of blood pressure control so patient will know what the normal blood pressure range is.RN sent EMMI education on fall prevention and how to get up from a fall.RN sent inform,ation on medical alert systems.  RN will follow up on fall prevention  THN CM Short Term Goal #1   Patient will report having a better understanding of low sodium diet within the next 30 days  THN CM Short Term Goal #1 Start Date  02/12/18  Interventions for Short Term Goal #1  RN discussed low sodium diet. RN sent educational material on low sodium and dash diet. RN will follow up with further discussion  THN CM Short Term Goal #2   Patient will report receiving educational material on health maintenance within the next 30 days  THN CM Short Term Goal #2 Start Date  02/12/18  Interventions for Short Term Goal #2  RN discussed importance of health Maintenance. RN sent  education booklet on Living well with Diabetes. RN sent educal material on A matter of choices blood pressure control. RN sent educational material on getting eye exams, foot exams and will follow up with next outreach on vaccinations.  THN CM Short Term Goal #3 Start Date  02/12/18       Assessment:  Patient is weighing daily Patient knows the zones and action plan of CHF Patient will benefit from Health Coach telephonic outreach for education and support for CHf,HTN and diabetes self management.  Plan:  RN discussed low sodium diet RN discussed  medication adherence RN sent Educational booklet Living well with Diabetes RN sent A matter of choices blood pressure control RN sent educational material on Low sodium diet RN sent barriers letter and assessment to PCP RN will follow up within the month of August  Veronica Shaw [place] [phone]

## 2018-03-01 ENCOUNTER — Encounter: Payer: Self-pay | Admitting: Cardiology

## 2018-03-01 ENCOUNTER — Ambulatory Visit (INDEPENDENT_AMBULATORY_CARE_PROVIDER_SITE_OTHER): Payer: Medicare Other | Admitting: *Deleted

## 2018-03-01 DIAGNOSIS — I428 Other cardiomyopathies: Secondary | ICD-10-CM | POA: Diagnosis not present

## 2018-03-01 NOTE — Progress Notes (Signed)
Remote ICD transmission.   

## 2018-03-12 ENCOUNTER — Other Ambulatory Visit: Payer: Self-pay | Admitting: Cardiovascular Disease

## 2018-03-13 DIAGNOSIS — H25812 Combined forms of age-related cataract, left eye: Secondary | ICD-10-CM | POA: Diagnosis not present

## 2018-03-13 DIAGNOSIS — H2512 Age-related nuclear cataract, left eye: Secondary | ICD-10-CM | POA: Diagnosis not present

## 2018-03-15 ENCOUNTER — Ambulatory Visit (INDEPENDENT_AMBULATORY_CARE_PROVIDER_SITE_OTHER): Payer: Medicare Other

## 2018-03-15 DIAGNOSIS — I5022 Chronic systolic (congestive) heart failure: Secondary | ICD-10-CM | POA: Diagnosis not present

## 2018-03-15 DIAGNOSIS — Z95810 Presence of automatic (implantable) cardiac defibrillator: Secondary | ICD-10-CM | POA: Diagnosis not present

## 2018-03-16 ENCOUNTER — Telehealth: Payer: Self-pay

## 2018-03-16 NOTE — Telephone Encounter (Signed)
Remote ICM transmission received.  Attempted call to patient and recording stated customer not available

## 2018-03-16 NOTE — Progress Notes (Signed)
EPIC Encounter for ICM Monitoring  Patient Name: Veronica Shaw is a 67 y.o. female Date: 03/16/2018 Primary Care Physican: Caroline More, DO Primary Cardiologist:Cooper/Weaver PA Electrophysiologist: Lovena Le Dry Weight:Previous note131lbs       Attempted call to patient and unable to reach.    Transmission reviewed.    Thoracic impedance abnormal but trending toward baseline.  Prescribed dosage: Furosemide40 mgTake 0.5 tablets (20 mg total) by mouth daily as needed for fluid (For swelling).  Labs: 11/30/2017 Creatinine 1.45, BUN 22, Potassium 5.3, Sodium 140, EGFR 37-43 10/19/2017 Creatinine1.16, BUN18, Potassium4.1, Sodium139, [id number] 10/18/2017 Creatinine1.24, BUN17, Potassium4.4, Sodium139, [id number]  10/17/2017 Creatinine1.46, BUN23, Potassium4.8, Sodium135, [id number]  10/15/2017 Creatinine1.61, BUN24, Potassium4.0, [id number], [id number] 04/07/2017 Creatinine 1.18, BUN 23, Potassium 4.1, Sodium 142, EGFR 46-54 04/04/2017 Creatinine 1.04, BUN 21, Potassium 4.6, Sodium 143, EGFR 55-64 01/25/2017 Creatinine 1.17, BUN 22, Potassium 4.5, Sodium 142, EGFR 48-56 12/28/2016 Creatinine 1.05, BUN 20, Potassium 4.2, Sodium 143, EGFR 55-64, NT-Pro BNP 244  Recommendations: NONE - Unable to reach.  Follow-up plan: ICM clinic phone appointment on 03/22/2018 to recheck fluid levels.      Copy of ICM check sent to Dr. Lovena Le.   3 month ICM trend: 03/15/2018    1 Year ICM trend:       [name], RN 03/16/2018 10:31 AM

## 2018-03-19 ENCOUNTER — Other Ambulatory Visit: Payer: Self-pay | Admitting: *Deleted

## 2018-03-19 NOTE — Patient Outreach (Signed)
[facility]) Care Management  03/19/2018   Veronica Shaw Oct 04, 1950 [id number]  RN Health Coach telephone call to patient.  Hipaa compliance verified. Per patient she is in the green zone of CHF. Patient is not having any swelling or shortness of breath. Patient had recent cataract surgery 2 week ago and the next surgery is planned for September. Patient still has some blurry vision. Patient is monitoring weight and blood pressure. No recent falls since last outreach. Patient has agreed to follow up outreach calls.   Current Medications:  Current Outpatient Medications  Medication Sig Dispense Refill  . albuterol (PROVENTIL HFA;VENTOLIN HFA) 108 (90 Base) MCG/ACT inhaler Inhale 2 puffs into the lungs every 4 (four) hours as needed for wheezing or shortness of breath. 18 g 3  . albuterol (PROVENTIL) (2.5 MG/3ML) 0.083% nebulizer solution Take 3 mLs (2.5 mg total) by nebulization every 6 (six) hours as needed for wheezing or shortness of breath. 90 mL 12  . benzonatate (TESSALON) 200 MG capsule Take 1 capsule (200 mg total) by mouth 3 (three) times daily as needed for cough. (Patient not taking: Reported on 12/04/2017) 30 capsule 0  . Biotin 5000 MCG CAPS Take 1 tablet by mouth daily.    . Calcium Carb-Cholecalciferol 600-800 MG-UNIT TABS Take 1 tablet by mouth daily. 90 tablet 3  . carbidopa-levodopa (SINEMET CR) 50-200 MG tablet Take 1 tablet by mouth at bedtime. 90 tablet 1  . carbidopa-levodopa (SINEMET IR) 25-100 MG tablet Take 1 tablet by mouth 3 (three) times daily. 270 tablet 1  . Coenzyme Q10 (CO Q-10) 100 MG CHEW Chew 1 Dose by mouth daily.    . furosemide (LASIX) 40 MG tablet Take 0.5 tablets (20 mg total) by mouth daily as needed for fluid (For swelling). 30 tablet 3  . gabapentin (NEURONTIN) 100 MG capsule Take 1 capsule (100 mg total) by mouth at bedtime. Take as needed. 30 capsule 0  . Lactobacillus (PROBIOTIC ACIDOPHILUS PO) Take 1 tablet by mouth daily.    [name] Kitchen  losartan (COZAAR) 25 MG tablet TAKE 1 TABLET EVERY DAY 90 tablet 2  . metFORMIN (GLUCOPHAGE) 500 MG tablet TAKE 2 TABLETS TWICE DAILY WITH A MEAL (Patient taking differently: TAKE 1000 mg TABLETS TWICE DAILY WITH A MEAL) 360 tablet 3  . metoprolol succinate (TOPROL-XL) 50 MG 24 hr tablet Take 1 tablet (50 mg total) by mouth 2 (two) times daily. 180 tablet 3  . mometasone-formoterol (DULERA) 100-5 MCG/ACT AERO Inhale 2 puffs into the lungs 2 (two) times daily. 1 Inhaler 5  . Multiple Vitamins-Minerals (OCUVITE ADULT 50+ PO) Take 1 tablet by mouth daily.    . potassium chloride (K-DUR,KLOR-CON) 10 MEQ tablet TAKE 1 TABLET DAILY AS NEEDED (TAKE WITH LASIX AS NEEDED FOR SWELLING). (Patient taking differently: TAKE 10 mg  TABLET DAILY AS NEEDED (TAKE WITH LASIX AS NEEDED FOR SWELLING).) 30 tablet 6  . ranitidine (ZANTAC) 150 MG tablet Take 1 tablet (150 mg total) by mouth 2 (two) times daily. (Patient taking differently: Take 150 mg by mouth as needed. ) 90 tablet 1  . spironolactone (ALDACTONE) 25 MG tablet Take 0.5 tablets (12.5 mg total) by mouth daily. 90 tablet 3  . Turmeric 500 MG TABS Take 1 tablet by mouth daily.     No current facility-administered medications for this visit.     Functional Status:  In your present state of health, do you have any difficulty performing the following activities: 02/12/2018 11/10/2017  Hearing? N N  Vision?  Y Y  Comment - wears glasses.   has cataracts  Difficulty concentrating or making decisions? Veronica Shaw  Walking or climbing stairs? Y Y  Dressing or bathing? N N  Doing errands, shopping? Y Y  Comment - goes with someone  Conservation officer, nature and eating ? N N  Using the Toilet? Y Y  Comment - uses a bedside potty at night  In the past six months, have you accidently leaked urine? Y Y  Do you have problems with loss of bowel control? Y Y  Managing your Medications? N N  Managing your Finances? N N  Housekeeping or managing your Housekeeping? N N  Some recent  data might be hidden    Fall/Depression Screening: Fall Risk  03/19/2018 02/12/2018 01/24/2018  Falls in the past year? Yes Yes Yes  Comment - - -  Number falls in past yr: 2 or more 2 or more 2 or more  Injury with Fall? Yes Yes Yes  Comment - - -  Risk Factor Category  High Fall Risk High Fall Risk High Fall Risk  Risk for fall due to : History of fall(s);Impaired balance/gait;Impaired mobility;Impaired vision Impaired balance/gait;Impaired mobility;History of fall(s) -  Follow up Falls evaluation completed;Falls prevention discussed Falls evaluation completed;Falls prevention discussed -   PHQ 2/9 Scores 03/19/2018 02/12/2018 11/30/2017 11/10/2017 11/03/2017 10/27/2017 05/30/2017  PHQ - 2 Score 0 0 0 0 0 0 0  PHQ- 9 Score - - - - - - -  Exception Documentation - - - - - - -   THN CM Care Plan Problem One     Most Recent Value  Care Plan Problem One  Knowledge Deficit in self management of chf  Role Documenting the Problem One  [place] for Problem One  Active  THN Long Term Goal   Patient will report no readmission for CHF and following up onhealth maintenance within the next 90days  THN Long Term Goal Start Date  03/19/18  Interventions for Problem One Long Term Goal  RN reiterated the heart zones and patient had recent cataract surgery with vision still blurry. Patient monitoring blood pressure and taking fall precautions. Patient has next cataract surgery planned for September. RN will follow up for health maintenance.  THN CM Short Term Goal #1   Patient will report having a better understanding of low sodium diet within the next 30 days      Assessment:  RN reiterated zones of CHF Patient is in the green zone of CHF Patient monitors blood pressure daily  Patient had cataract surgery done 2 weeks ago and next cataract surgery is scheduled in September  Plan:  Cataract surgery in September Patient is following up with maintenance care RN will follow up out reach  within the month of September  Veronica Shaw Care Management [phone]

## 2018-03-22 ENCOUNTER — Ambulatory Visit (INDEPENDENT_AMBULATORY_CARE_PROVIDER_SITE_OTHER): Payer: Medicare Other

## 2018-03-22 DIAGNOSIS — I5022 Chronic systolic (congestive) heart failure: Secondary | ICD-10-CM

## 2018-03-22 DIAGNOSIS — Z95810 Presence of automatic (implantable) cardiac defibrillator: Secondary | ICD-10-CM

## 2018-03-22 NOTE — Progress Notes (Signed)
EPIC Encounter for ICM Monitoring  Patient Name: Veronica Shaw is a 67 y.o. female Date: 03/22/2018 Primary Care Physican: Caroline More, DO Primary Cardiologist:Cooper/Weaver PA Electrophysiologist: Lovena Le Dry Weight:129lbs           Heart Failure questions reviewed, pt asymptomatic.  She took some PRN Furosemide due to weight gain.    Thoracic impedance returned to normal since 03/15/2018 remote transmission.  Prescribed dosage: Furosemide40 mgTake 0.5 tablets (20 mg total) by mouth daily as needed for fluid (For swelling).  Labs: 11/30/2017 Creatinine 1.45, BUN 22, Potassium 5.3, Sodium 140, EGFR 37-43 10/19/2017 Creatinine1.16, BUN18, Potassium4.1, Sodium139, [id number] 10/18/2017 Creatinine1.24, BUN17, Potassium4.4, Sodium139, [id number]  10/17/2017 Creatinine1.46, BUN23, Potassium4.8, Sodium135, [id number]  10/15/2017 Creatinine1.61, BUN24, Potassium4.0, [id number], [id number] 04/07/2017 Creatinine 1.18, BUN 23, Potassium 4.1, Sodium 142, EGFR 46-54 04/04/2017 Creatinine 1.04, BUN 21, Potassium 4.6, Sodium 143, EGFR 55-64 01/25/2017 Creatinine 1.17, BUN 22, Potassium 4.5, Sodium 142, EGFR 48-56 12/28/2016 Creatinine 1.05, BUN 20, Potassium 4.2, Sodium 143, EGFR 55-64, NT-Pro BNP 244  Recommendations: No changes.   Encouraged to call for fluid symptoms.  Follow-up plan: ICM clinic phone appointment on 04/23/2018.     Copy of ICM check sent to Dr. Lovena Le.   3 month ICM trend: 03/22/2018     1 Year ICM trend:       [name], RN 03/22/2018 12:22 PM

## 2018-03-28 ENCOUNTER — Other Ambulatory Visit: Payer: Self-pay | Admitting: Family Medicine

## 2018-03-28 DIAGNOSIS — Z1231 Encounter for screening mammogram for malignant neoplasm of breast: Secondary | ICD-10-CM

## 2018-03-29 LAB — CUP PACEART REMOTE DEVICE CHECK
Battery Remaining Longevity: 69 mo
Battery Voltage: 2.98 V
Brady Statistic AP VP Percent: 0 %
Brady Statistic AP VS Percent: 0 %
Brady Statistic AS VP Percent: 0.03 %
Brady Statistic AS VS Percent: 99.96 %
Brady Statistic RA Percent Paced: 0.01 %
Brady Statistic RV Percent Paced: 0.04 %
Date Time Interrogation Session: 20190801073424
HighPow Impedance: 66 Ohm
Implantable Lead Implant Date: 20150427
Implantable Lead Implant Date: 20150427
Implantable Lead Location: 753859
Implantable Lead Location: 753860
Implantable Lead Model: 5076
Implantable Lead Model: 6935
Implantable Pulse Generator Implant Date: 20150427
Lead Channel Impedance Value: 361 Ohm
Lead Channel Impedance Value: 4047 Ohm
Lead Channel Impedance Value: 4047 Ohm
Lead Channel Impedance Value: 4047 Ohm
Lead Channel Impedance Value: 513 Ohm
Lead Channel Impedance Value: 589 Ohm
Lead Channel Pacing Threshold Amplitude: 0.5 V
Lead Channel Pacing Threshold Amplitude: 0.625 V
Lead Channel Pacing Threshold Pulse Width: 0.4 ms
Lead Channel Pacing Threshold Pulse Width: 0.4 ms
Lead Channel Sensing Intrinsic Amplitude: 1.375 mV
Lead Channel Sensing Intrinsic Amplitude: 1.375 mV
Lead Channel Sensing Intrinsic Amplitude: 13.125 mV
Lead Channel Sensing Intrinsic Amplitude: 13.125 mV
Lead Channel Setting Pacing Amplitude: 2 V
Lead Channel Setting Pacing Amplitude: 2.5 V
Lead Channel Setting Pacing Pulse Width: 0.4 ms
Lead Channel Setting Sensing Sensitivity: 0.3 mV

## 2018-04-09 ENCOUNTER — Other Ambulatory Visit: Payer: Self-pay

## 2018-04-09 ENCOUNTER — Encounter (HOSPITAL_COMMUNITY): Payer: Self-pay

## 2018-04-09 ENCOUNTER — Ambulatory Visit (HOSPITAL_COMMUNITY)
Admission: EM | Admit: 2018-04-09 | Discharge: 2018-04-09 | Disposition: A | Discharge status: Home / Self Care | Payer: Medicare Other | Attending: Family Medicine | Admitting: Family Medicine

## 2018-04-09 DIAGNOSIS — Z7984 Long term (current) use of oral hypoglycemic drugs: Secondary | ICD-10-CM | POA: Insufficient documentation

## 2018-04-09 DIAGNOSIS — K219 Gastro-esophageal reflux disease without esophagitis: Secondary | ICD-10-CM | POA: Insufficient documentation

## 2018-04-09 DIAGNOSIS — M545 Low back pain: Secondary | ICD-10-CM | POA: Diagnosis not present

## 2018-04-09 DIAGNOSIS — J441 Chronic obstructive pulmonary disease with (acute) exacerbation: Secondary | ICD-10-CM | POA: Insufficient documentation

## 2018-04-09 DIAGNOSIS — Z79899 Other long term (current) drug therapy: Secondary | ICD-10-CM | POA: Diagnosis not present

## 2018-04-09 DIAGNOSIS — I429 Cardiomyopathy, unspecified: Secondary | ICD-10-CM | POA: Diagnosis not present

## 2018-04-09 DIAGNOSIS — N189 Chronic kidney disease, unspecified: Secondary | ICD-10-CM | POA: Insufficient documentation

## 2018-04-09 DIAGNOSIS — N390 Urinary tract infection, site not specified: Secondary | ICD-10-CM | POA: Insufficient documentation

## 2018-04-09 DIAGNOSIS — M8580 Other specified disorders of bone density and structure, unspecified site: Secondary | ICD-10-CM | POA: Diagnosis not present

## 2018-04-09 DIAGNOSIS — G20 Parkinson's disease: Secondary | ICD-10-CM | POA: Insufficient documentation

## 2018-04-09 DIAGNOSIS — E1122 Type 2 diabetes mellitus with diabetic chronic kidney disease: Secondary | ICD-10-CM | POA: Insufficient documentation

## 2018-04-09 DIAGNOSIS — E1142 Type 2 diabetes mellitus with diabetic polyneuropathy: Secondary | ICD-10-CM | POA: Insufficient documentation

## 2018-04-09 DIAGNOSIS — E785 Hyperlipidemia, unspecified: Secondary | ICD-10-CM | POA: Diagnosis not present

## 2018-04-09 DIAGNOSIS — G4730 Sleep apnea, unspecified: Secondary | ICD-10-CM | POA: Diagnosis not present

## 2018-04-09 DIAGNOSIS — R350 Frequency of micturition: Secondary | ICD-10-CM | POA: Diagnosis present

## 2018-04-09 DIAGNOSIS — I130 Hypertensive heart and chronic kidney disease with heart failure and stage 1 through stage 4 chronic kidney disease, or unspecified chronic kidney disease: Secondary | ICD-10-CM | POA: Insufficient documentation

## 2018-04-09 DIAGNOSIS — I5022 Chronic systolic (congestive) heart failure: Secondary | ICD-10-CM | POA: Insufficient documentation

## 2018-04-09 LAB — POCT URINALYSIS DIP (DEVICE)
Glucose, UA: NEGATIVE mg/dL
Nitrite: NEGATIVE
Protein, ur: >=300 mg/dL — AB
Specific Gravity, Urine: 1.025 (ref 1.005–1.030)
Urobilinogen, UA: 1 mg/dL (ref 0.0–1.0)
pH: 5.5 (ref 5.0–8.0)

## 2018-04-09 MED ORDER — SULFAMETHOXAZOLE-TRIMETHOPRIM 800-160 MG PO TABS: 1.0000 | ORAL_TABLET | Freq: Two times a day (BID) | ORAL | 0 refills | Status: DC

## 2018-04-09 NOTE — ED Provider Notes (Signed)
[place]    CSN: [id number] Arrival date & time: 04/09/18  3614     History   Chief Complaint Chief Complaint  Patient presents with  . Urinary Tract Infection    HPI Veronica Shaw is a 67 y.o. female.   Veronica Shaw presents with complaints of burning and frequency with urination which started two days ago. Noted blood to her urine last night. No fevers. Slight headache. Has had uti in the past, was years ago, feels similar.no nausea, vomiting diarrhea. Mild pelvic and left sided low back pain. No vaginal symptoms. Hx oc asthma, chf, dm, htn, parkinsons, ckd.     ROS per HPI.      Past Medical History:  Diagnosis Date  . Asthma   . Chronic systolic CHF (congestive heart failure) ([place])    a. cMRI 4/15: EF 34% and findings - c/w NICM, normal RV size and function (RVEF 61%), Mild MR // b. Echo 2/15:  EF 30-35%, diff HK, ant-sept AK, Gr 2 DD, mild MR, trivial TR  //  c. Echo 5/17: EF 20-25%, severe diffuse HK, marked systolic dyssynchrony, grade 1 diastolic dysfunction, mild MR  //  d. RHC 5/17: Fick CO 2.9, RVSP 19, PASP 15, PW mean 2, low filing pressures and preserved CO   . Diabetes mellitus   . Gastritis   . History of echocardiogram    Echo 6/18: EF 30-35, diffuse HK, grade 1 diastolic dysfunction, trivial MR, mild LAE, mild TR, no pericardial effusion  . History of nuclear stress test    Myoview 5/18: EF 49, no ischemia, inferoseptal defect c/w LBBB artifact (intermediate risk due to EF < 50).  [name] Kitchen HTN (hypertension)   . Hyperlipidemia   . NICM (nonischemic cardiomyopathy) ([place])    a. Nuclear 5/13: Normal stress nuclear study. LV Ejection Fraction: 58%  //  b. LHC 10/14: Minor luminal irregularity in prox LAD, EF 35%   . Parkinson disease ([place])   . Plantar fasciitis   . Sleep apnea    was retested and no longer had it and so d/c CPAP  . Urticaria     Patient Active Problem List   Diagnosis Date Noted  . Muscle cramp 12/03/2017  . Pain of right upper  extremity 12/03/2017  . Cough 10/27/2017  . Cognitive impairment 10/19/2017  . Syncope   . Asthma with COPD with exacerbation ([place])   . Multiple falls   . Flu 10/17/2017  . Asthma exacerbation 08/03/2017  . Dense breast tissue on mammogram 05/30/2017  . Hair loss 07/21/2016  . Allergic rhinitis 11/09/2015  . Upper airway cough syndrome 08/20/2015  . Osteopenia 07/10/2015  . Mild persistent asthma in adult without complication 43/15/4008  . Bunion, left foot 05/20/2014  . NICM (nonischemic cardiomyopathy) ([place]) 04/23/2014  . Arthritis or polyarthritis, rheumatoid ([place]) 03/12/2014  . ICD (implantable cardioverter-defibrillator), dual, in situ 03/06/2014  . Thoracic or lumbosacral neuritis or radiculitis, unspecified 07/03/2013  . Chronic systolic heart failure ([place]) 05/31/2013  . Parkinson disease ([place]) 05/21/2013  . 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 Major depression 03/31/2013  . Balance problem 03/03/2013  . Lower extremity edema 03/03/2013  . GERD (gastroesophageal reflux disease) 09/27/2012  . Dyspnea 09/19/2012  . Plantar fasciitis, bilateral 06/15/2012  . Cervical pain (neck) 04/12/2012  . 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 Diabetic peripheral neuropathy ([place]) 12/09/2011  . Hyperlipidemia 12/09/2011  . Sleep apnea 12/09/2011  . Fatigue 10/13/2011  . Chronic urticaria 05/27/2011  . 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 05/20/2011  .  Sciatic leg pain 07/22/2009  . ROSACEA 05/29/2009  . ACHILLES BURSITIS OR TENDINITIS 03/27/2009  . Type II diabetes mellitus with complication ([place]) 07/68/0881  . Essential hypertension 09/28/2006    Past Surgical History:  Procedure Laterality Date  . BUNIONECTOMY    . CARDIAC CATHETERIZATION N/A 12/16/2015   Procedure: Right Heart Cath;  Surgeon: Sherren Mocha, MD;  Location: [facility];  Service: Cardiovascular;  Laterality: N/A;  . CHOLECYSTECTOMY    . IMPLANTABLE CARDIOVERTER DEFIBRILLATOR IMPLANT  11-25-13   MDT dual  chamber ICD implanted by [name] for primary prevention  . IMPLANTABLE CARDIOVERTER DEFIBRILLATOR IMPLANT N/A 11/25/2013   Procedure: IMPLANTABLE CARDIOVERTER DEFIBRILLATOR IMPLANT;  Surgeon: [name], MD;  Location: [facility];  Service: Cardiovascular;  Laterality: N/A;  . LEFT AND RIGHT HEART CATHETERIZATION WITH CORONARY ANGIOGRAM N/A 05/31/2013   Procedure: LEFT AND RIGHT HEART CATHETERIZATION WITH CORONARY ANGIOGRAM;  Surgeon: [name], MD;  Location: [facility] CATH LAB;  Service: Cardiovascular;  Laterality: N/A;  . TONSILLECTOMY    . TUBAL LIGATION      OB History   None      Home Medications    Prior to Admission medications   Medication Sig Start Date End Date Taking? Authorizing Provider  albuterol (PROVENTIL HFA;VENTOLIN HFA) 108 (90 Base) MCG/ACT inhaler Inhale 2 puffs into the lungs every 4 (four) hours as needed for wheezing or shortness of breath. 05/30/17   Rogue Bussing, MD  albuterol (PROVENTIL) (2.5 MG/3ML) 0.083% nebulizer solution Take 3 mLs (2.5 mg total) by nebulization every 6 (six) hours as needed for wheezing or shortness of breath. 08/03/17   Deneise Lever, MD  benzonatate (TESSALON) 200 MG capsule Take 1 capsule (200 mg total) by mouth 3 (three) times daily as needed for cough. Patient not taking: Reported on 12/04/2017 10/27/17   Verner Mould, MD  Biotin 5000 MCG CAPS Take 1 tablet by mouth daily.    [provider]  Calcium Carb-Cholecalciferol 600-800 MG-UNIT TABS Take 1 tablet by mouth daily. 11/23/16   Rogue Bussing, MD  carbidopa-levodopa (SINEMET CR) 50-200 MG tablet Take 1 tablet by mouth at bedtime. 01/24/18   Tat, Eustace Quail, DO  carbidopa-levodopa (SINEMET IR) 25-100 MG tablet Take 1 tablet by mouth 3 (three) times daily. 10/09/17   Tat, Eustace Quail, DO  Coenzyme Q10 (CO Q-10) 100 MG CHEW Chew 1 Dose by mouth daily.    [provider]  furosemide (LASIX) 40 MG tablet Take 0.5 tablets (20 mg  total) by mouth daily as needed for fluid (For swelling). 09/22/17   Rogue Bussing, MD  gabapentin (NEURONTIN) 100 MG capsule Take 1 capsule (100 mg total) by mouth at bedtime. Take as needed. 11/30/17   Rogue Bussing, MD  Lactobacillus (PROBIOTIC ACIDOPHILUS PO) Take 1 tablet by mouth daily.    [provider]  losartan (COZAAR) 25 MG tablet TAKE 1 TABLET EVERY DAY 03/13/18   Sherren Mocha, MD  metFORMIN (GLUCOPHAGE) 500 MG tablet TAKE 2 TABLETS TWICE DAILY WITH A MEAL Patient taking differently: TAKE 1000 mg TABLETS TWICE DAILY WITH A MEAL 01/16/17   Rogue Bussing, MD  metoprolol succinate (TOPROL-XL) 50 MG 24 hr tablet Take 1 tablet (50 mg total) by mouth 2 (two) times daily. 11/23/17   [name], MD  mometasone-formoterol (DULERA) 100-5 MCG/ACT AERO Inhale 2 puffs into the lungs 2 (two) times daily. 11/30/17   Juanito Doom, MD  Multiple Vitamins-Minerals ([place]  50+ PO) Take 1 tablet by mouth daily.    [provider]  potassium chloride (K-DUR,KLOR-CON) 10 MEQ tablet TAKE 1 TABLET DAILY AS NEEDED (TAKE WITH LASIX AS NEEDED FOR SWELLING). Patient taking differently: TAKE 10 mg  TABLET DAILY AS NEEDED (TAKE WITH LASIX AS NEEDED FOR SWELLING). 07/18/17   Rogue Bussing, MD  ranitidine (ZANTAC) 150 MG tablet Take 1 tablet (150 mg total) by mouth 2 (two) times daily. Patient taking differently: Take 150 mg by mouth as needed.  07/28/17   Rogue Bussing, MD  spironolactone (ALDACTONE) 25 MG tablet Take 0.5 tablets (12.5 mg total) by mouth daily. 03/08/17 10/17/17  Daune Perch, NP  sulfamethoxazole-trimethoprim (BACTRIM DS) 800-160 MG tablet Take 1 tablet by mouth 2 (two) times daily for 7 days. 04/09/18 04/16/18  Zigmund Gottron, NP  Turmeric 500 MG TABS Take 1 tablet by mouth daily.    [provider]    Family History Family History  Problem Relation Age of Onset  . Coronary artery disease Father         Died age 49  . Heart attack Father   . Diabetes Father   . Coronary artery disease Mother        Died age 48  . Heart attack Mother   . Diabetes Mother   . Stroke Mother   . Parkinson's disease Sister   . Heart disease Sister   . Hepatitis C Sister   . Diabetes Son 37       T1DM  . Diabetes Sister   . Heart disease Sister   . Diabetes Sister   . Heart disease Sister   . Breast cancer Neg Hx     Social History Social History   Tobacco Use  . Smoking status: Never Smoker  . Smokeless tobacco: Never Used  . Tobacco comment: exposed to smoke during child hood (parents)  Substance Use Topics  . Alcohol use: No    Alcohol/week: 0.0 standard drinks  . Drug use: No     Allergies   Aspirin; Penicillins; Prempro [conj estrog-medroxyprogest ace]; Ace inhibitors; and Simvastatin   Review of Systems Review of Systems   Physical Exam Triage Vital Signs ED Triage Vitals  Enc Vitals Group     BP 04/09/18 0919 120/64     Pulse Rate 04/09/18 0919 74     Resp 04/09/18 0919 18     Temp 04/09/18 0919 98.5 F (36.9 C)     Temp Source 04/09/18 0919 Oral     SpO2 04/09/18 0919 99 %     Weight 04/09/18 0921 130 lb (59 kg)     Height --      Head Circumference --      Peak Flow --      Pain Score 04/09/18 0921 2     Pain Loc --      Pain Edu? --      Excl. in [place]? --    No data found.  Updated Vital Signs BP 120/64 (BP Location: Right Arm)   Pulse 74   Temp 98.5 F (36.9 C) (Oral)   Resp 18   Wt 130 lb (59 kg)   LMP 08/01/2000 (Approximate)   SpO2 99%   BMI 23.78 kg/m    Physical Exam  Constitutional: She is oriented to person, place, and time. She appears well-developed and well-nourished. No distress.  Cardiovascular: Normal rate, regular rhythm and normal heart sounds.  Pulmonary/Chest: Effort normal and breath sounds normal.  Abdominal: Soft. Bowel sounds are normal. There is tenderness in the suprapubic area. There is no rigidity, no rebound, no guarding, no  CVA tenderness and negative Murphy's sign.  Mild pelvic pain on palpation without point tenderness   Neurological: She is alert and oriented to person, place, and time.  Skin: Skin is warm and dry.     UC Treatments / Results  Labs (all labs ordered are listed, but only abnormal results are displayed) Labs Reviewed  POCT URINALYSIS DIP (DEVICE) - Abnormal; Notable for the following components:      Result Value   Bilirubin Urine MODERATE (*)    Ketones, ur TRACE (*)    Hgb urine dipstick LARGE (*)    Protein, ur >=300 (*)    Leukocytes, UA LARGE (*)    All other components within normal limits  URINE CULTURE    EKG None  Radiology No results found.  Procedures Procedures (including critical care time)  Medications Ordered in UC Medications - No data to display  Initial Impression / Assessment and Plan / UC Course  I have reviewed the triage vital signs and the nursing notes.  Pertinent labs & imaging results that were available during my care of the patient were reviewed by me and considered in my medical decision making (see chart for details).     History, physical and ua results consistent with UTI. To complete course of antibiotics. Follow up with PCP in the next week for recheck. Return precautions provided. Patient verbalized understanding and agreeable to plan.  Ambulatory out of clinic without difficulty.    Final Clinical Impressions(s) / UC Diagnoses   Final diagnoses:  Lower urinary tract infectious disease     Discharge Instructions     Drink plenty of water to empty bladder regularly. Avoid alcohol and caffeine as these may irritate the bladder.   Complete course of antibiotics.  Please follow up with your primary care provider in the next 7-10 days for recheck.  Return sooner if develop increased pain, fevers, inability to urinate, nausea, vomiting, or otherwise worsening.     ED Prescriptions    Medication Sig Dispense Auth. Provider    sulfamethoxazole-trimethoprim (BACTRIM DS) 800-160 MG tablet Take 1 tablet by mouth 2 (two) times daily for 7 days. 14 tablet Zigmund Gottron, NP     Controlled Substance Prescriptions [place] Controlled Substance Registry consulted? Not Applicable   Zigmund Gottron, NP 04/09/18 1022

## 2018-04-09 NOTE — Discharge Instructions (Signed)
Drink plenty of water to empty bladder regularly. Avoid alcohol and caffeine as these may irritate the bladder.   Complete course of antibiotics.  Please follow up with your primary care provider in the next 7-10 days for recheck.  Return sooner if develop increased pain, fevers, inability to urinate, nausea, vomiting, or otherwise worsening.

## 2018-04-09 NOTE — ED Notes (Signed)
Urine specimen obtained while patient in lobby

## 2018-04-09 NOTE — ED Triage Notes (Signed)
Pt states she has a UTI x 2 days. Pt states she has a headache.

## 2018-04-10 LAB — URINE CULTURE: Culture: <10000 — AB

## 2018-04-12 ENCOUNTER — Other Ambulatory Visit: Payer: Self-pay

## 2018-04-12 ENCOUNTER — Encounter: Payer: Self-pay | Admitting: Family Medicine

## 2018-04-12 ENCOUNTER — Ambulatory Visit (INDEPENDENT_AMBULATORY_CARE_PROVIDER_SITE_OTHER): Payer: Medicare Other | Admitting: Family Medicine

## 2018-04-12 VITALS — BP 110/62 | HR 86 | Temp 98.3°F | Ht 62.0 in | Wt 130.0 lb

## 2018-04-12 DIAGNOSIS — Z23 Encounter for immunization: Secondary | ICD-10-CM | POA: Diagnosis not present

## 2018-04-12 DIAGNOSIS — E118 Type 2 diabetes mellitus with unspecified complications: Secondary | ICD-10-CM

## 2018-04-12 DIAGNOSIS — N3001 Acute cystitis with hematuria: Secondary | ICD-10-CM

## 2018-04-12 LAB — POCT GLYCOSYLATED HEMOGLOBIN (HGB A1C): HbA1c, POC (controlled diabetic range): 6.1 % (ref 0.0–7.0)

## 2018-04-12 MED ORDER — PHENAZOPYRIDINE HCL 200 MG PO TABS: 200.0000 mg | ORAL_TABLET | Freq: Three times a day (TID) | ORAL | 0 refills | Status: DC | PRN

## 2018-04-12 MED ORDER — NITROFURANTOIN MONOHYD MACRO 100 MG PO CAPS: 100.0000 mg | ORAL_CAPSULE | Freq: Two times a day (BID) | ORAL | 0 refills | Status: AC

## 2018-04-12 NOTE — Assessment & Plan Note (Signed)
Diabetes continues to be well controlled. Patient is taking Metformin 1000mg  bid, as prescribed. A1C is 6.1, down from 6.7. Patient received her flu shot today and is due for an upcoming foot exam.

## 2018-04-12 NOTE — Progress Notes (Signed)
Subjective:   Patient ID: Veronica Shaw    DOB: 06-Aug-1950, 67 y.o. female   MRN: [id number]  Chief Complaint: followup of UTI   History of Present Illness: Veronica Shaw is a 67 y.o. woman with history of diabetes mellitus, asthma, CHF, parkinson disease, who presents to clinic today for follow up of UTI.  UTI Patient was seen in the ED 04/09/2018, 4 days ago, for two days of dysuria, urinary frequency, and hematuria. Urinalysis was negative for nitrites, positive for leukocytes; culture with insignificant growth of E.coli. Patient on day 4 of her prescribed 7 day course of bactrim bid. Patient has been taking the antibiotic as prescribed. She reports initial improvement, but last night reports return of her frequency, urgency, hematuria and dull suprapubic abdominal pain. Patient denies fever, chills, nausea, vomiting, vaginal discharge, bleeding or irritation. Patient has a history of CKD and reports she is very vigilant about being evaluated when having any urinary symptoms because she does not want to get a kidney infection.   Diabetes Patient is taking Metformin 1000mg  bid, as prescribed. She checks her blood glucose at home: reports measurements ranging from 110-180 mg/dL.  Review of Systems  Constitutional: Negative for chills and fever.  Respiratory: Positive for shortness of breath.   Cardiovascular: Negative for chest pain.  Gastrointestinal: Positive for diarrhea. Negative for nausea and vomiting.  Genitourinary: Positive for dysuria, flank pain, frequency, hematuria and urgency. Negative for decreased urine volume, genital sores, vaginal bleeding and vaginal discharge.  Skin: Positive for rash.    [place]: Reviewed.  Past Medical History:  Diagnosis Date  . Asthma   . Chronic systolic CHF (congestive heart failure) ([place])    a. cMRI 4/15: EF 34% and findings - c/w NICM, normal RV size and function (RVEF 61%), Mild MR // b. Echo 2/15:  EF 30-35%, diff HK, ant-sept AK, Gr 2 DD,  mild MR, trivial TR  //  c. Echo 5/17: EF 20-25%, severe diffuse HK, marked systolic dyssynchrony, grade 1 diastolic dysfunction, mild MR  //  d. RHC 5/17: Fick CO 2.9, RVSP 19, PASP 15, PW mean 2, low filing pressures and preserved CO   . Diabetes mellitus   . Gastritis   . History of echocardiogram    Echo 6/18: EF 30-35, diffuse HK, grade 1 diastolic dysfunction, trivial MR, mild LAE, mild TR, no pericardial effusion  . History of nuclear stress test    Myoview 5/18: EF 49, no ischemia, inferoseptal defect c/w LBBB artifact (intermediate risk due to EF < 50).  [name] Kitchen HTN (hypertension)   . Hyperlipidemia   . NICM (nonischemic cardiomyopathy) ([place])    a. Nuclear 5/13: Normal stress nuclear study. LV Ejection Fraction: 58%  //  b. LHC 10/14: Minor luminal irregularity in prox LAD, EF 35%   . Parkinson disease ([place])   . Plantar fasciitis   . Sleep apnea    was retested and no longer had it and so d/c CPAP  . Urticaria      Current Outpatient Medications  Medication Sig Dispense Refill  . albuterol (PROVENTIL HFA;VENTOLIN HFA) 108 (90 Base) MCG/ACT inhaler Inhale 2 puffs into the lungs every 4 (four) hours as needed for wheezing or shortness of breath. 18 g 3  . albuterol (PROVENTIL) (2.5 MG/3ML) 0.083% nebulizer solution Take 3 mLs (2.5 mg total) by nebulization every 6 (six) hours as needed for wheezing or shortness of breath. 90 mL 12  . Biotin 5000 MCG CAPS Take 1 tablet by mouth  daily.    . Calcium Carb-Cholecalciferol 600-800 MG-UNIT TABS Take 1 tablet by mouth daily. 90 tablet 3  . carbidopa-levodopa (SINEMET CR) 50-200 MG tablet Take 1 tablet by mouth at bedtime. 90 tablet 1  . carbidopa-levodopa (SINEMET IR) 25-100 MG tablet Take 1 tablet by mouth 3 (three) times daily. 270 tablet 1  . Coenzyme Q10 (CO Q-10) 100 MG CHEW Chew 1 Dose by mouth daily.    . furosemide (LASIX) 40 MG tablet Take 0.5 tablets (20 mg total) by mouth daily as needed for fluid (For swelling). 30 tablet 3  .  gabapentin (NEURONTIN) 100 MG capsule Take 1 capsule (100 mg total) by mouth at bedtime. Take as needed. 30 capsule 0  . Lactobacillus (PROBIOTIC ACIDOPHILUS PO) Take 1 tablet by mouth daily.    [name] Kitchen losartan (COZAAR) 25 MG tablet TAKE 1 TABLET EVERY DAY 90 tablet 2  . metFORMIN (GLUCOPHAGE) 500 MG tablet TAKE 2 TABLETS TWICE DAILY WITH A MEAL (Patient taking differently: TAKE 1000 mg TABLETS TWICE DAILY WITH A MEAL) 360 tablet 3  . metoprolol succinate (TOPROL-XL) 50 MG 24 hr tablet Take 1 tablet (50 mg total) by mouth 2 (two) times daily. 180 tablet 3  . mometasone-formoterol (DULERA) 100-5 MCG/ACT AERO Inhale 2 puffs into the lungs 2 (two) times daily. 1 Inhaler 5  . Multiple Vitamins-Minerals ([place] ADULT 50+ PO) Take 1 tablet by mouth daily.    . nitrofurantoin, macrocrystal-monohydrate, (MACROBID) 100 MG capsule Take 1 capsule (100 mg total) by mouth 2 (two) times daily for 5 days. 10 capsule 0  . phenazopyridine (PYRIDIUM) 200 MG tablet Take 1 tablet (200 mg total) by mouth 3 (three) times daily as needed for pain. 10 tablet 0  . potassium chloride (K-DUR,KLOR-CON) 10 MEQ tablet TAKE 1 TABLET DAILY AS NEEDED (TAKE WITH LASIX AS NEEDED FOR SWELLING). (Patient taking differently: TAKE 10 mg  TABLET DAILY AS NEEDED (TAKE WITH LASIX AS NEEDED FOR SWELLING).) 30 tablet 6  . ranitidine (ZANTAC) 150 MG tablet Take 1 tablet (150 mg total) by mouth 2 (two) times daily. (Patient taking differently: Take 150 mg by mouth as needed. ) 90 tablet 1  . Turmeric 500 MG TABS Take 1 tablet by mouth daily.    . benzonatate (TESSALON) 200 MG capsule Take 1 capsule (200 mg total) by mouth 3 (three) times daily as needed for cough. (Patient not taking: Reported on 04/12/2018) 30 capsule 0  . spironolactone (ALDACTONE) 25 MG tablet Take 0.5 tablets (12.5 mg total) by mouth daily. 90 tablet 3   No current facility-administered medications for this visit.     Objective:   BP 110/62   Pulse 86   Temp 98.3 F (36.8  C) (Oral)   Ht 5\' 2"  (1.575 m)   Wt 130 lb (59 kg)   LMP 08/01/2000 (Approximate)   SpO2 98%   BMI 23.78 kg/m   Physical Exam  Constitutional: She appears well-developed and well-nourished. No distress.  Cardiovascular: Normal rate, regular rhythm and normal heart sounds.  Pulmonary/Chest: Effort normal and breath sounds normal. She has no wheezes. She has no rales.  Defibrillator  Abdominal: Soft. Bowel sounds are normal. She exhibits no distension. There is tenderness in the suprapubic area. There is no CVA tenderness.  Nursing note and vitals reviewed.   Assessment & Plan:  Veronica Shaw is a 67 y.o. woman with history of diabetes, hypertension, asthma, CHF with defibrillator, stage 3 CKD, GERD and parkinson disease.  Dysuria   who presents  to clinic today for follow up of dysuria, hematuria, urgency and frequency that began six days ago. Patient was seen at [facility] 04/09/2018, four days ago for her symptoms. POCT urinalysis revealed hemoglobin and leukocytes; culture with < 10k colonies of E.coli, no sensitivity testing. Patient was prescribed 7 day course of Bactrim bid. Patient reports return or her dysuria, hematuria, urgency, frequency and lower abdominal pain last night, despite being 4 days into her antibiotic course. Objectively patient is afebrile, well appearing, with mild suprapubic tenderness on abdominal exam. Patient's presentation is consistent with uncomplicated cystitis. Given her age and history of CKD, and returned symptoms, plan to stop Bactrim and start Nitrofurantoin and a short course of pyridium for symptomatic relief. Differential for patient more broadly includes, 1 pyelonephritis: patient has no constitutional/systemic symptoms or exam findings that would suggest pyelonephritis.  2 genitourinary syndrome of menopause: patient is a 67 year old woman. Her last menstrual period was almost two decades ago; unsurprisingly she endorses vaginal dryness. Patient's  symptoms could be associated with vaginal atrophy, irritation and dryness. May consider topical estrogen is symptoms persist.  3 urethritis or vaginitis: Patient could have contracted an STI. Patient is married and states that she has not had sex with her husband for 5 years and has had no new sexual partners. Patient could have new candidal vaginitis due to antibiotics. Patient denies abnormal vaginal discharge, vaginal itching, vaginal rash or lesions.    Expectations are that patient's symptoms will improve with nitrofurantoin. Patient given return precautions for unimproving and/or worsening symptoms.    Type II diabetes mellitus with complication Diabetes continues to be well controlled. Patient is taking Metformin 1000mg  bid, as prescribed. A1C is 6.1, down from 6.7. Patient received her flu shot today and is due for an upcoming foot exam.    Orders Placed This Encounter  Procedures  . Flu Vaccine QUAD 36+ mos IM  . POCT glycosylated hemoglobin (Hb A1C)   Meds ordered this encounter  Medications  . nitrofurantoin, macrocrystal-monohydrate, (MACROBID) 100 MG capsule    Sig: Take 1 capsule (100 mg total) by mouth 2 (two) times daily for 5 days.    Dispense:  10 capsule    Refill:  0  . phenazopyridine (PYRIDIUM) 200 MG tablet    Sig: Take 1 tablet (200 mg total) by mouth 3 (three) times daily as needed for pain.    Dispense:  10 tablet    Refill:  0    Stockton Bing, DO  [facility] 04/13/2018 2:32 PM  I have personally seen and examined this patient with Agustina Caroli and agree with the above note. The following is my additional documentation.   Physical Exam: General: well nourished, well developed, NAD with non-toxic appearance HEENT: normocephalic, atraumatic, moist mucous membranes Cardiovascular: regular rate and rhythm without murmurs, rubs, or gallops Lungs: clear to auscultation bilaterally with normal work of breathing Abdomen: soft, non-tender,  non-distended, normoactive bowel sounds GU: mild suprapubic tenderness present, absent flank pain Skin: warm, dry, no rashes or lesions, cap refill < 2 seconds Extremities: warm and well perfused, normal tone, no edema  Assessment/Plan Veronica Shaw is a 67 y.o. female presenting with persistent dysuria, polyuria, urgency, and suprapubic pain concerning for acute cystitis.  Her symptoms have not improved with use of Bactrim.  She does not show any signs of pyelonephritis.  Not consistent with vaginitis.  Recent UA at urgent care revealing hematuria with insignificant growth of E. Coli, for no  sensitivities were obtained.  Is consistent with prior cultures.  No history of sensitivities based on prior cultures.  Given patient's symptoms, will proceed with treatment and switch to nitrofurantoin given improved coverage and history of CKD stage III.  Will defer culturing given current treatment with Bactrim.  Reviewed return precautions.  May consider vaginal estrogen in the future.   Harriet [place], [place], PGY-3

## 2018-04-12 NOTE — Patient Instructions (Signed)
Thank you for coming in to see [place] today. Please see below to review our plan for today's visit.  You do have symptoms concerning for a urinary infection.  I would like you to discontinue the Bactrim and start the new antibiotic called nitrofurantoin which she will take twice daily for 5 days.  Your symptoms should improve in approximately 48 hours.  You can also take the Pyridium up to 3 times daily.  This medication will help reduce your urgency.  I would avoid NSAIDs such as Motrin or naproxen and use Tylenol as needed for pain in the interim.  Follow-up with your PCP and nephrologist as scheduled.  If you develop any fevers or chills, have significant flank pain, or began having nausea or vomiting, seek immediate medical attention.  Please call the clinic at [phone] if your symptoms worsen or you have any concerns. It was our pleasure to serve you.  Harriet Butte, [place], PGY-3

## 2018-04-17 DIAGNOSIS — H2511 Age-related nuclear cataract, right eye: Secondary | ICD-10-CM | POA: Diagnosis not present

## 2018-04-17 DIAGNOSIS — H25811 Combined forms of age-related cataract, right eye: Secondary | ICD-10-CM | POA: Diagnosis not present

## 2018-04-21 ENCOUNTER — Other Ambulatory Visit: Payer: Self-pay | Admitting: Internal Medicine

## 2018-04-23 ENCOUNTER — Ambulatory Visit (INDEPENDENT_AMBULATORY_CARE_PROVIDER_SITE_OTHER): Payer: Medicare Other

## 2018-04-23 DIAGNOSIS — I5022 Chronic systolic (congestive) heart failure: Secondary | ICD-10-CM

## 2018-04-23 DIAGNOSIS — Z95810 Presence of automatic (implantable) cardiac defibrillator: Secondary | ICD-10-CM | POA: Diagnosis not present

## 2018-04-23 NOTE — Progress Notes (Signed)
EPIC Encounter for ICM Monitoring  Patient Name: Veronica Shaw is a 68 y.o. female Date: 04/23/2018 Primary Care Physican: Caroline More, DO Primary Cardiologist:Cooper/Weaver PA Electrophysiologist: Lovena Le Dry Weight:130lbs       Heart Failure questions reviewed, pt asymptomatic.   Thoracic impedance normal.  Prescribed: Furosemide40 mgTake 0.5 tablets (20 mg total) by mouth daily as needed for fluid (For swelling).  Labs: 11/30/2017 Creatinine 1.45, BUN 22, Potassium 5.3, Sodium 140, EGFR 37-43 10/19/2017 Creatinine1.16, BUN18, Potassium4.1, Sodium139, [id number] 10/18/2017 Creatinine1.24, BUN17, Potassium4.4, Sodium139, [id number]  10/17/2017 Creatinine1.46, BUN23, Potassium4.8, Sodium135, [id number]  10/15/2017 Creatinine1.61, BUN24, Potassium4.0, VNRWCH364, [id number]  Recommendations: No changes.   Encouraged to call for fluid symptoms.  Follow-up plan: ICM clinic phone appointment on 05/24/2018.    Copy of ICM check sent to Dr. Lovena Le.   3 month ICM trend: 04/23/2018    1 Year ICM trend:       [name], RN 04/23/2018 2:45 PM

## 2018-04-24 ENCOUNTER — Other Ambulatory Visit: Payer: Self-pay | Admitting: *Deleted

## 2018-04-24 NOTE — Patient Outreach (Signed)
[place] [facility]) Care Management  04/24/2018  Veronica Shaw 1951-04-29 [id number]   RN Health Coach telephone call to patient.  Hipaa compliance verified. Per patient he was driving and wanted to put on speaker. RN explained that she could call  back later for safety issues. Patient stated call back after 3p  RN will call patient again [place] [facility] [phone]

## 2018-04-30 ENCOUNTER — Ambulatory Visit: Payer: Medicare Other

## 2018-04-30 DIAGNOSIS — N183 Chronic kidney disease, stage 3 (moderate): Secondary | ICD-10-CM | POA: Diagnosis not present

## 2018-04-30 DIAGNOSIS — I129 Hypertensive chronic kidney disease with stage 1 through stage 4 chronic kidney disease, or unspecified chronic kidney disease: Secondary | ICD-10-CM | POA: Diagnosis not present

## 2018-05-01 ENCOUNTER — Inpatient Hospital Stay: Admission: RE | Admit: 2018-05-01 | Payer: Medicare Other | Source: Ambulatory Visit

## 2018-05-01 ENCOUNTER — Other Ambulatory Visit: Payer: Self-pay | Admitting: *Deleted

## 2018-05-01 NOTE — Patient Outreach (Signed)
[facility] [facility]) Care Management  05/01/2018   Veronica Shaw 22-Aug-1950 [id number]  RN Health Coach telephone call to patient.  Hipaa compliance verified. Patient is weighing daily. Patient is in the green zone with her CHF.  Per patient she was not feeling very well today. Patient stated that she had awoken several times during the night with leg cramps. Per patient she had been to the Dr and was waiting on the results of her labs to be called to her. Per patient she has gabapentin ordered but didn't think about taking it. Patient has had eye surgery done and vision is crystal clear. Patient stated that she is still currently driving her car. Per patient her family has talked about a medical alert system and she didn't think about one that had a GPS system .Patient does have a defibrillator and hadn't thought about getting help if she was driving and it went off. Patient stated  she has fallen off the commode into the bath tub and it was hard for her husband to hear her. Patient had heard an advertisement on the television about one of her medications causing cancer. Patient stated it has made her very anxious. Patient has agreed for the pharmacy to call and discuss the information.  Patient has agreed to follow up outreach calls.     Current Medications:  Current Outpatient Medications  Medication Sig Dispense Refill  . albuterol (PROVENTIL HFA;VENTOLIN HFA) 108 (90 Base) MCG/ACT inhaler Inhale 2 puffs into the lungs every 4 (four) hours as needed for wheezing or shortness of breath. 18 g 3  . albuterol (PROVENTIL) (2.5 MG/3ML) 0.083% nebulizer solution Take 3 mLs (2.5 mg total) by nebulization every 6 (six) hours as needed for wheezing or shortness of breath. 90 mL 12  . benzonatate (TESSALON) 200 MG capsule Take 1 capsule (200 mg total) by mouth 3 (three) times daily as needed for cough. (Patient not taking: Reported on 04/12/2018) 30 capsule 0  . Biotin 5000 MCG CAPS Take 1 tablet by  mouth daily.    . Calcium Carb-Cholecalciferol 600-800 MG-UNIT TABS Take 1 tablet by mouth daily. 90 tablet 3  . carbidopa-levodopa (SINEMET CR) 50-200 MG tablet Take 1 tablet by mouth at bedtime. 90 tablet 1  . carbidopa-levodopa (SINEMET IR) 25-100 MG tablet Take 1 tablet by mouth 3 (three) times daily. 270 tablet 1  . Coenzyme Q10 (CO Q-10) 100 MG CHEW Chew 1 Dose by mouth daily.    . furosemide (LASIX) 40 MG tablet Take 0.5 tablets (20 mg total) by mouth daily as needed for fluid (For swelling). 30 tablet 3  . gabapentin (NEURONTIN) 100 MG capsule Take 1 capsule (100 mg total) by mouth at bedtime. Take as needed. 30 capsule 0  . Lactobacillus (PROBIOTIC ACIDOPHILUS PO) Take 1 tablet by mouth daily.    [name] Kitchen losartan (COZAAR) 25 MG tablet TAKE 1 TABLET EVERY DAY 90 tablet 2  . metFORMIN (GLUCOPHAGE) 500 MG tablet TAKE 2 TABLETS TWICE DAILY WITH A MEAL (Patient taking differently: TAKE 1000 mg TABLETS TWICE DAILY WITH A MEAL) 360 tablet 3  . metoprolol succinate (TOPROL-XL) 50 MG 24 hr tablet Take 1 tablet (50 mg total) by mouth 2 (two) times daily. 180 tablet 3  . mometasone-formoterol (DULERA) 100-5 MCG/ACT AERO Inhale 2 puffs into the lungs 2 (two) times daily. 1 Inhaler 5  . Multiple Vitamins-Minerals (OCUVITE ADULT 50+ PO) Take 1 tablet by mouth daily.    . phenazopyridine (PYRIDIUM) 200 MG tablet Take  1 tablet (200 mg total) by mouth 3 (three) times daily as needed for pain. 10 tablet 0  . potassium chloride (K-DUR,KLOR-CON) 10 MEQ tablet TAKE 1 TABLET DAILY AS NEEDED (TAKE WITH LASIX AS NEEDED FOR SWELLING). (Patient taking differently: TAKE 10 mg  TABLET DAILY AS NEEDED (TAKE WITH LASIX AS NEEDED FOR SWELLING).) 30 tablet 6  . ranitidine (ZANTAC) 150 MG tablet TAKE 1 TABLET TWICE DAILY 180 tablet 1  . spironolactone (ALDACTONE) 25 MG tablet Take 0.5 tablets (12.5 mg total) by mouth daily. 90 tablet 3  . Turmeric 500 MG TABS Take 1 tablet by mouth daily.     No current facility-administered  medications for this visit.     Functional Status:  In your present state of health, do you have any difficulty performing the following activities: 02/12/2018 11/10/2017  Hearing? N N  Vision? Y Y  Comment - wears glasses.   has cataracts  Difficulty concentrating or making decisions? [name]  Walking or climbing stairs? Y Y  Dressing or bathing? N N  Doing errands, shopping? Y Y  Comment - goes with someone  Conservation officer, nature and eating ? N N  Using the Toilet? Y Y  Comment - uses a bedside potty at night  In the past six months, have you accidently leaked urine? Y Y  Do you have problems with loss of bowel control? Y Y  Managing your Medications? N N  Managing your Finances? N N  Housekeeping or managing your Housekeeping? N N  Some recent data might be hidden    Fall/Depression Screening: Fall Risk  05/01/2018 03/19/2018 02/12/2018  Falls in the past year? Yes Yes Yes  Comment - - -  Number falls in past yr: 2 or more 2 or more 2 or more  Injury with Fall? Yes Yes Yes  Comment - - -  Risk Factor Category  High Fall Risk High Fall Risk High Fall Risk  Risk for fall due to : History of fall(s);Impaired balance/gait;Impaired mobility History of fall(s);Impaired balance/gait;Impaired mobility;Impaired vision Impaired balance/gait;Impaired mobility;History of fall(s)  Follow up Falls evaluation completed;Education provided;Falls prevention discussed Falls evaluation completed;Falls prevention discussed Falls evaluation completed;Falls prevention discussed   PHQ 2/9 Scores 05/01/2018 04/12/2018 03/19/2018 02/12/2018 11/30/2017 11/10/2017 11/03/2017  PHQ - 2 Score 0 0 0 0 0 0 0  PHQ- 9 Score - - - - - - -  Exception Documentation - - - - - - -   THN CM Care Plan Problem One     Most Recent Value  Care Plan Problem One  Knowledge Deficit in self management of chf  Role Documenting the Problem One  [place] for Problem One  Active  THN Long Term Goal   Patient will report no  readmission for CHF and following up onhealth maintenance within the next 90days  THN Long Term Goal Start Date  05/01/18  Interventions for Problem One Long Term Goal  RN reiterated the CHF action plan. RN reiterated daily weights. RN send education material on CHF exacerbation. RN will follow upwith each outreach  [facility] CM Short Term Goal #1   Patient will report having a better understanding of low sodium diet within the next 30 days  THN CM Short Term Goal #1 Met Date  05/01/18  [facility] CM Short Term Goal #2   Patient will report receiving educational material on health maintenance within the next 30 days  THN CM Short Term Goal #2 Met Date  05/01/18  THN CM Short Term Goal #3  Patient will report receiving educational material on obtaining a medical alert system within the next 30 days  Interventions for Short Tern Goal #3  RN discussed the medical alert system and benfits since patient had fall and had a hard time making family hear her. RN sent the brochure to patient. RN will follow up on receiving      Assessment:  Patient has been having frequent leg cramps Patient is weighing daily Patient has heard about an advertisement on Zantac causing cancer and has become anxious Patient had flu shot this year Patient has had bilateral cataract surgery and vision is clear No readmissions for CHF or COPD  Plan:   RN discussed CHF and zones RN sent educational material on COPD exacerbation and CHF exacerbation RN sent medical alert brochure RN referred to pharmacy RN discussed medication adherence RN will follow up within the month of November

## 2018-05-02 ENCOUNTER — Other Ambulatory Visit: Payer: Self-pay | Admitting: Pharmacist

## 2018-05-02 NOTE — Patient Outreach (Signed)
[facility]) Care Management  [place]   05/02/2018  Veronica Shaw 08-09-1950 [id number]   Reason for Consult: Medication question on recalled zantac; education on gabapentin   Referral source: Center Current insurance: Medicare A/B + supplement Currently receiving Extra Help:  []  Yes []  No [x]  Unknown  PMHx includes, but not limited to, T2DM with peripheral neuropathy, HTN, GERD, depression, NICM, chronic systolic HF, Parkinson's Disease, asthma,and cognitive impairment.   HPI:  Successful call to Veronica Shaw. HIPAA identifiers verified. Patient reports that she has been taking ranitidine for several years as a prescription medication and is concerned about the recent call.  I reviewed the most up-to-date information from FDA.gov on the recall including the improper testing methods used which created the NMDA and that the FDA is continuing to look into testing samples.  OTC products have been recalled but no prescription products yet.  I encouraged patient to call her pharmacy to confirm which manufacture she has been receiving and to inquire about the recall as well.  Patient voiced understanding.  We also reviewed PRN gabapentin use.  Patient denies any further medication concerns at this time.  She denies needing a medication review.    Objective: Lab Results  Component Value Date   HGBA1C 6.1 04/12/2018   Lipid Panel     Component Value Date/Time   CHOL 172 05/31/2013 0435   TRIG 142 05/31/2013 0435   HDL 49 05/31/2013 0435   CHOLHDL 3.5 05/31/2013 0435   VLDL 28 05/31/2013 0435   LDLCALC 95 05/31/2013 0435   LDLDIRECT 111 (H) 02/10/2015 1145    Allergies  Allergen Reactions  . Aspirin Swelling and Other (See Comments)    Other reaction(s): Other (See Comments) Causes nose bleeds *ONLY THE COATED ASA* Causes nose bleeds *ONLY THE COATED ASA*  . Penicillins Shortness Of Breath and Rash    Shortness of Breath - Throat felt like it  was closing.   [name] [Conj Estrog-Medroxyprogest Ace] Shortness Of Breath    Throat swelling Throat swelling  . Ace Inhibitors Cough  . Simvastatin Other (See Comments) and Rash    Muscle aches    Medications Reviewed Today    Reviewed by Verlin Grills, RN (Case Manager) on 05/01/18 at 1310  Med List Status: <None>  Medication Order Taking? Sig Documenting Provider Last Dose Status Informant  albuterol (PROVENTIL HFA;VENTOLIN HFA) 108 (90 Base) MCG/ACT inhaler [id number] No Inhale 2 puffs into the lungs every 4 (four) hours as needed for wheezing or shortness of breath. Rogue Bussing, MD Taking Active Self  albuterol (PROVENTIL) (2.5 MG/3ML) 0.083% nebulizer solution [id number] No Take 3 mLs (2.5 mg total) by nebulization every 6 (six) hours as needed for wheezing or shortness of breath. Deneise Lever, MD Taking Active Self  benzonatate (TESSALON) 200 MG capsule [id number] No Take 1 capsule (200 mg total) by mouth 3 (three) times daily as needed for cough.  Patient not taking:  Reported on 04/12/2018   Verner Mould, MD Not Taking Active   Biotin 5000 MCG CAPS [id number] No Take 1 tablet by mouth daily. [provider] Taking Active   Calcium Carb-Cholecalciferol 600-800 MG-UNIT TABS [id number] No Take 1 tablet by mouth daily. Rogue Bussing, MD Taking Active Self           Med Note Verlin Grills   Tue May 01, 2018 12:39 PM) Per patient the urologist said for patient not to take everyday  carbidopa-levodopa (SINEMET CR) 50-200 MG tablet [id number] No Take 1 tablet by mouth at bedtime. Ludwig Clarks, DO Taking Active   carbidopa-levodopa (SINEMET IR) 25-100 MG tablet [id number] No Take 1 tablet by mouth 3 (three) times daily. Ludwig Clarks, DO Taking Active Self  Coenzyme Q10 (CO Q-10) 100 MG CHEW [id number] No Chew 1 Dose by mouth daily. [provider] Taking Active   furosemide (LASIX) 40 MG tablet [id number] No Take 0.5 tablets  (20 mg total) by mouth daily as needed for fluid (For swelling). Rogue Bussing, MD Taking Active Self           Med Note Clementeen Graham Dec 04, 2017 10:20 AM) Dewaine Conger about 2 times per week  gabapentin (NEURONTIN) 100 MG capsule [id number] No Take 1 capsule (100 mg total) by mouth at bedtime. Take as needed. Rogue Bussing, MD Taking Active   Lactobacillus (PROBIOTIC ACIDOPHILUS PO) [id number] No Take 1 tablet by mouth daily. [provider] Taking Active   losartan (COZAAR) 25 MG tablet [id number] No TAKE 1 TABLET EVERY DAY Sherren Mocha, MD Taking Active   metFORMIN (GLUCOPHAGE) 500 MG tablet [id number] No TAKE 2 TABLETS TWICE DAILY WITH A MEAL  Patient taking differently:  TAKE 1000 mg TABLETS TWICE DAILY WITH A MEAL   Fitzgerald, Sharman Cheek, MD Taking Active Self  metoprolol succinate (TOPROL-XL) 50 MG 24 hr tablet [id number] No Take 1 tablet (50 mg total) by mouth 2 (two) times daily. [name], MD Taking Active   mometasone-formoterol [facility]) 100-5 MCG/ACT Hollie Salk [id number] No Inhale 2 puffs into the lungs 2 (two) times daily. Juanito Doom, MD Taking Active   Multiple Vitamins-Minerals (OCUVITE ADULT 50+ PO) [id number] No Take 1 tablet by mouth daily. [provider] Taking Active   phenazopyridine (PYRIDIUM) 200 MG tablet [id number] No Take 1 tablet (200 mg total) by mouth 3 (three) times daily as needed for pain. [place] Bing, DO Taking Active   potassium chloride (K-DUR,KLOR-CON) 10 MEQ tablet [id number] No TAKE 1 TABLET DAILY AS NEEDED (TAKE WITH LASIX AS NEEDED FOR SWELLING).  Patient taking differently:  TAKE 10 mg  TABLET DAILY AS NEEDED (TAKE WITH LASIX AS NEEDED FOR SWELLING).   Rogue Bussing, MD Taking Active Self  ranitidine (ZANTAC) 150 MG tablet [id number]  TAKE 1 TABLET TWICE DAILY Caroline More, DO  Active   spironolactone (ALDACTONE) 25 MG tablet [id number] No Take 0.5 tablets (12.5 mg total) by mouth daily.  Daune Perch, NP 10/17/2017 Unknown time Expired 10/17/17 2359 Self           Med Note [name], [name]   Thu Jan 11, 2018 11:04 AM) Currently taking daily  Turmeric 500 MG TABS [id number] No Take 1 tablet by mouth daily. [provider] Taking Active             Plan:  I will close pharmacy case at this time.  I am happy to assist in the future as needed.    Thank you for allowing [facility] pharmacy to be involved in this patient's care.    Ralene Bathe, PharmD, [place] [phone]

## 2018-05-03 ENCOUNTER — Ambulatory Visit (INDEPENDENT_AMBULATORY_CARE_PROVIDER_SITE_OTHER): Payer: Medicare Other | Admitting: Family Medicine

## 2018-05-03 ENCOUNTER — Other Ambulatory Visit: Payer: Self-pay

## 2018-05-03 VITALS — BP 120/64 | HR 76 | Temp 98.2°F | Ht 62.0 in | Wt 132.0 lb

## 2018-05-03 DIAGNOSIS — L249 Irritant contact dermatitis, unspecified cause: Secondary | ICD-10-CM

## 2018-05-03 MED ORDER — HYDROCORTISONE 0.5 % EX OINT: 1.0000 "application " | TOPICAL_OINTMENT | Freq: Two times a day (BID) | CUTANEOUS | 0 refills | Status: DC

## 2018-05-03 NOTE — Patient Instructions (Signed)
Avoid coconut oil on face  Wash your face daily with Cetaphil  Avoid sunshine---please apply sunscreen when you leave the home!  Call in 1 week if symptoms worsen or do not improve   It was wonderful to see you today.  Thank you for choosing [facility].   Please call [phone] with any questions about today's appointment.  Please be sure to schedule follow up at the front  desk before you leave today.   Dorris Singh, MD  Family Medicine

## 2018-05-03 NOTE — Progress Notes (Signed)
  Patient Name: Veronica Shaw Date of Birth: 1950-10-29 Date of Visit: 05/03/18 PCP: Caroline More, DO  Chief Complaint: Rash on face  Subjective: Veronica Shaw is a pleasant 67 y.o. year old with history significant for Parkinson's disease, alopecia, nonischemic cardiomyopathy with ICD in place cataracts presenting today with a new onset rash.  She reports this is an acute problem.  This began 1 day ago.  She reports this started on both her face and her chest at the same time.  She uses coconut daily on her face.  She has been using new eyedrops for the last month and a half due to cataract surgery.  She does not routinely go outside and reports she uses an umbrella because she is very sensitive to the sun.  The rash is pruritic in nature.  She is only tried coconut oil on this.  This is not improve the condition.  She denies new soaps, detergents, lotions, cleansers, shampoos, cleaners for her dishes.  She feels otherwise well denies fevers, muscle weakness, dyspnea.     ROS:  ROS Negative except for as above I have reviewed the patient's medical, surgical, family, and social history as appropriate.   Vitals:   05/03/18 1443  BP: 120/64  Pulse: 76  Temp: 98.2 F (36.8 C)  SpO2: 95%   Filed Weights   05/03/18 1443  Weight: 132 lb (59.9 kg)    HEENT: Sclera anicteric. Dentition is moderate. Appears well hydrated. Neck: Supple Cardiac: Regular rate and rhythm. Normal S1/S2. No murmurs, rubs, or gallops appreciated. Lungs: Clear bilaterally to ascultation.  SKIN: Across bilateral infraorbital skin and along the nasal bridge there are erythematous papules and macules.  Along left anterior sun exposed chest area there are also erythematous macules and papules.   Veronica Shaw was seen today for rash on face x 1 day / eyes puffy.  Diagnoses and all orders for this visit:  Irritant contact dermatitis, unspecified trigger, probably due to sun exposure or a new irritant from a cleanser or  topical agent.  Discussed at length discussed differential.  There are no vesicular lesions.  The lesions are not painful.  No history of rosacea.  No new medications.  Recommended gentle facial cleanser eliminating irritating soaps and cleansers.  Treat only chest wall area with hydrocortisone ointment as below.  Reviewed reasons to return to care -     hydrocortisone ointment 0.5 %; Apply 1 application topically 2 (two) times daily. To chest area  Dorris Singh, MD  [facility] Medicine Teaching Service

## 2018-05-11 ENCOUNTER — Other Ambulatory Visit: Payer: Self-pay | Admitting: Neurology

## 2018-05-24 ENCOUNTER — Emergency Department (HOSPITAL_COMMUNITY)
Admission: EM | Admit: 2018-05-24 | Discharge: 2018-05-24 | Disposition: A | Discharge status: Home / Self Care | Payer: Medicare Other | Attending: Emergency Medicine | Admitting: Emergency Medicine

## 2018-05-24 ENCOUNTER — Telehealth: Payer: Self-pay | Admitting: *Deleted

## 2018-05-24 ENCOUNTER — Ambulatory Visit (INDEPENDENT_AMBULATORY_CARE_PROVIDER_SITE_OTHER): Payer: Medicare Other

## 2018-05-24 ENCOUNTER — Ambulatory Visit (HOSPITAL_COMMUNITY)
Admission: EM | Admit: 2018-05-24 | Discharge: 2018-05-24 | Disposition: A | Discharge status: Home / Self Care | Payer: Medicare Other | Source: Home / Self Care | Attending: Family Medicine | Admitting: Family Medicine

## 2018-05-24 ENCOUNTER — Other Ambulatory Visit: Payer: Self-pay

## 2018-05-24 ENCOUNTER — Encounter (HOSPITAL_COMMUNITY): Payer: Self-pay

## 2018-05-24 DIAGNOSIS — R350 Frequency of micturition: Secondary | ICD-10-CM | POA: Diagnosis not present

## 2018-05-24 DIAGNOSIS — I5022 Chronic systolic (congestive) heart failure: Secondary | ICD-10-CM

## 2018-05-24 DIAGNOSIS — R109 Unspecified abdominal pain: Secondary | ICD-10-CM | POA: Insufficient documentation

## 2018-05-24 DIAGNOSIS — Z95810 Presence of automatic (implantable) cardiac defibrillator: Secondary | ICD-10-CM | POA: Diagnosis not present

## 2018-05-24 DIAGNOSIS — Z5321 Procedure and treatment not carried out due to patient leaving prior to being seen by health care provider: Secondary | ICD-10-CM | POA: Insufficient documentation

## 2018-05-24 DIAGNOSIS — M549 Dorsalgia, unspecified: Secondary | ICD-10-CM | POA: Diagnosis not present

## 2018-05-24 LAB — CBC
HCT: 39.9 % (ref 36.0–46.0)
Hemoglobin: 12.8 g/dL (ref 12.0–15.0)
MCH: 29.4 pg (ref 26.0–34.0)
MCHC: 32.1 g/dL (ref 30.0–36.0)
MCV: 91.7 fL (ref 80.0–100.0)
Platelets: 180 10*3/uL (ref 150–400)
RBC: 4.35 MIL/uL (ref 3.87–5.11)
RDW: 11.7 % (ref 11.5–15.5)
WBC: 6.5 10*3/uL (ref 4.0–10.5)
nRBC: 0 % (ref 0.0–0.2)

## 2018-05-24 LAB — URINALYSIS, ROUTINE W REFLEX MICROSCOPIC
Bacteria, UA: NONE SEEN
Bilirubin Urine: NEGATIVE
Glucose, UA: NEGATIVE mg/dL
Hgb urine dipstick: NEGATIVE
Ketones, ur: 5 mg/dL — AB
Nitrite: NEGATIVE
Protein, ur: NEGATIVE mg/dL
Specific Gravity, Urine: 1.026 (ref 1.005–1.030)
pH: 5 (ref 5.0–8.0)

## 2018-05-24 LAB — COMPREHENSIVE METABOLIC PANEL
ALT: 6 U/L (ref 0–44)
AST: 22 U/L (ref 15–41)
Albumin: 3.9 g/dL (ref 3.5–5.0)
Alkaline Phosphatase: 83 U/L (ref 38–126)
Anion gap: 9 (ref 5–15)
BUN: 25 mg/dL — ABNORMAL HIGH (ref 8–23)
CO2: 23 mmol/L (ref 22–32)
Calcium: 10.1 mg/dL (ref 8.9–10.3)
Chloride: 108 mmol/L (ref 98–111)
Creatinine, Ser: 1.26 mg/dL — ABNORMAL HIGH (ref 0.44–1.00)
GFR calc Af Amer: 49 mL/min — ABNORMAL LOW (ref >60)
GFR calc non Af Amer: 42 mL/min — ABNORMAL LOW (ref >60)
Glucose, Bld: 123 mg/dL — ABNORMAL HIGH (ref 70–99)
Potassium: 4.1 mmol/L (ref 3.5–5.1)
Sodium: 140 mmol/L (ref 135–145)
Total Bilirubin: 0.6 mg/dL (ref 0.3–1.2)
Total Protein: 6.6 g/dL (ref 6.5–8.1)

## 2018-05-24 LAB — LIPASE, BLOOD: Lipase: 42 U/L (ref 11–51)

## 2018-05-24 NOTE — ED Notes (Signed)
Pt refused to stay, LWBS. Pt encouraged to stay several times

## 2018-05-24 NOTE — ED Notes (Signed)
Pt states her nephrologist told her to go to the ER but she just didn't want to wait. Pt give info for other ERs close by, will head over there. Pt agreeable to plan. Dr. Meda Coffee notified.

## 2018-05-24 NOTE — ED Triage Notes (Signed)
Pt here after calling nephrologist stating she is having urinary frequency, abdominal pain, and back pain.  Stated she has had this all going on for the last day.  Stage 3 CKD, CHF, and has a ICD. A&Ox4.

## 2018-05-24 NOTE — Telephone Encounter (Signed)
Pt states that she is urinating less, having back pain and abdominal pain. She called the nephrologist and they told her to call here.  Offered appt but she wonders if she can just have some labs drawn to check her CKD.  Spoke with Dr. Tammi Klippel, pt needs to be seen.  Since we have no appts today or tomorrow. Advised to go to urgent care/ED.  Pt is reluctant but agreeable. Shamyra Farias, Salome Spotted, CMA

## 2018-05-25 ENCOUNTER — Telehealth: Payer: Self-pay | Admitting: *Deleted

## 2018-05-25 ENCOUNTER — Other Ambulatory Visit: Payer: Self-pay

## 2018-05-25 ENCOUNTER — Ambulatory Visit (INDEPENDENT_AMBULATORY_CARE_PROVIDER_SITE_OTHER): Payer: Medicare Other | Admitting: Family Medicine

## 2018-05-25 VITALS — BP 96/60 | HR 85 | Temp 97.9°F | Wt 133.0 lb

## 2018-05-25 DIAGNOSIS — G20A1 Parkinson's disease without dyskinesia, without mention of fluctuations: Secondary | ICD-10-CM

## 2018-05-25 DIAGNOSIS — M545 Low back pain, unspecified: Secondary | ICD-10-CM

## 2018-05-25 DIAGNOSIS — R39198 Other difficulties with micturition: Secondary | ICD-10-CM | POA: Diagnosis not present

## 2018-05-25 DIAGNOSIS — R1084 Generalized abdominal pain: Secondary | ICD-10-CM | POA: Diagnosis not present

## 2018-05-25 DIAGNOSIS — I10 Essential (primary) hypertension: Secondary | ICD-10-CM | POA: Diagnosis not present

## 2018-05-25 DIAGNOSIS — N183 Chronic kidney disease, stage 3 unspecified: Secondary | ICD-10-CM

## 2018-05-25 DIAGNOSIS — G20 Parkinson's disease: Secondary | ICD-10-CM | POA: Diagnosis not present

## 2018-05-25 DIAGNOSIS — R339 Retention of urine, unspecified: Secondary | ICD-10-CM

## 2018-05-25 LAB — POCT UA - MICROSCOPIC ONLY

## 2018-05-25 LAB — POCT URINALYSIS DIP (MANUAL ENTRY)
Bilirubin, UA: NEGATIVE
Blood, UA: NEGATIVE
Glucose, UA: NEGATIVE mg/dL
Ketones, POC UA: NEGATIVE mg/dL
Nitrite, UA: NEGATIVE
Protein Ur, POC: NEGATIVE mg/dL
Spec Grav, UA: 1.02 (ref 1.010–1.025)
Urobilinogen, UA: 0.2 E.U./dL
pH, UA: 5.5 (ref 5.0–8.0)

## 2018-05-25 MED ORDER — NITROFURANTOIN MONOHYD MACRO 100 MG PO CAPS: 100.0000 mg | ORAL_CAPSULE | Freq: Two times a day (BID) | ORAL | 0 refills | Status: AC

## 2018-05-25 NOTE — Telephone Encounter (Addendum)
Reviewed telephone encounter from yesterday. She should be seen for an appointment. It doesn't look like her kidney function is worsened but she should still be seen. No note from ED MD as of yet.

## 2018-05-25 NOTE — Telephone Encounter (Signed)
Got it, saw it after I sent, sorry!

## 2018-05-25 NOTE — Progress Notes (Signed)
EPIC Encounter for ICM Monitoring  Patient Name: Veronica Shaw is a 67 y.o. female Date: 05/25/2018 Primary Care Physican: Caroline More, DO Primary Cardiologist:Cooper/Weaver PA Electrophysiologist: Lovena Le Dry Weight:130lbs         Heart Failure questions reviewed, pt asymptomatic.  She went to ER for back and belly pain.  She waited 7 hours and left but did have UA and labs done.  She has spoken with PCP office today.    Thoracic impedance normal.   Prescribed: Furosemide40 mgTake 0.5 tablets (20 mg total) by mouth daily as needed for fluid (For swelling).  Labs: 11/30/2017 Creatinine 1.45, BUN 22, Potassium 5.3, Sodium 140, EGFR 37-43 10/19/2017 Creatinine1.16, BUN18, Potassium4.1, Sodium139, [id number] 10/18/2017 Creatinine1.24, BUN17, Potassium4.4, Sodium139, [id number]  10/17/2017 Creatinine1.46, BUN23, Potassium4.8, Sodium135, [id number]  10/15/2017 Creatinine1.61, BUN24, Potassium4.0, JARWPT003, [id number]  Recommendations: No changes.  Encouraged to call for fluid symptoms.  Follow-up plan: ICM clinic phone appointment on 06/25/2018.     Copy of ICM check sent to Dr. Lovena Le.   3 month ICM trend: 05/24/2018    1 Year ICM trend:       Veronica Billings, RN 05/25/2018 11:31 AM

## 2018-05-25 NOTE — Assessment & Plan Note (Addendum)
Low today.  Reports blood pressure usually runs low.  Last cardiology note 95/7473 with systolic 403 and encouraged to continue current medication regimen.  Does report some lightheadedness with position changes.  Likely orthostatic given UA evidence of dehydration.  Mucous membranes moist with well-perfused extremities, do not feel the need for parenteral hydration at this time.  Encouraged to drink more water.

## 2018-05-25 NOTE — Assessment & Plan Note (Addendum)
Unclear etiology.  Suspect related to dehydration given history of incontinence, cognitive impairment and hyaline casts present on ED UA.  Has history of CKD 3 however creatinine on CMP done in ED actually improved from previous, no CVA tenderness.  No blood in urine to suggest stone obstruction.  Advised increasing water consumption and frequent urination to increase hydration and prevent incontinence.  Trace leaks, trace bacteria on today's UA, will treat for UTI.  Urine sent for culture.

## 2018-05-25 NOTE — Telephone Encounter (Signed)
Sorry, I was not clear.  I have scheduled her for an appt @ 3:10. [name], Salome Spotted, CMA

## 2018-05-25 NOTE — Patient Instructions (Signed)
It was great to see you!  Our plans for today:  - We are obtaining a CT scan of your abdomen. We will schedule this for you. - You should drink at least 60 ounces of water per day.  Take care and seek immediate care sooner if you develop any concerns.   Dr. [facility]

## 2018-05-25 NOTE — Progress Notes (Signed)
Subjective:   Patient ID: Veronica Shaw    DOB: [birth year], 67 y.o. female   MRN: [id number]  Veronica Shaw is a 67 y.o. female with a history of HTN, CHF, asthma, GERD, DM2, Parkinson Disease, HLD, depression, cognitive impairment here for   Urinary difficulty Patient reports a few months history of difficulty voiding, back and abdominal pain that worsened last weekend.  Went to the ED yesterday and had blood work and UA done however had to wait for 7 hours and was not seen.  States she has the urge to urinate but little comes out.  Endorses frequency.  No dysuria.  Has bilateral low back pain, especially at night that wakes her up from sleep.  Has a history of sciatica but states pain feels different.  Abdominal pain is worse around her navel but is diffuse.  Has history of UTI a few months ago was treated twice with resolution of symptoms.  Denies fevers, recent illnesses or trauma.  Has CKD 3 and is followed by nephrology.  History of chronic incontinence for the past year, wears diapers.  She is not sexually active with no concern for STI.  States she is eating and drinking less than usual for the past few months because food does not taste good.  Feels that she is getting full quicker but is unable to quantify.  No prior personal or family history of malignancy.  Takes Lasix as needed for weight gain has not used in the past week.  Review of Systems:  Per HPI.  [place], medications and smoking status reviewed.  Objective:   BP 96/60   Pulse 85   Temp 97.9 F (36.6 C) (Oral)   Wt 133 lb (60.3 kg)   LMP 08/01/2000 (Approximate)   SpO2 97%   BMI 24.33 kg/m  Vitals and nursing note reviewed.  General: well nourished, well developed, in no acute distress with non-toxic appearance HEENT: normocephalic, atraumatic, moist mucous membranes Neck: supple, non-tender without lymphadenopathy CV: regular rate and rhythm without murmurs, rubs, or gallops Lungs: clear to auscultation bilaterally with  normal work of breathing Abdomen: soft, TTP diffusely with some guarding, no rebound, non-distended, no masses or organomegaly palpable, normoactive bowel sounds Skin: warm, dry, no rashes or lesions Extremities: warm and well perfused, normal tone  Assessment & Plan:   Difficulty voiding Unclear etiology.  Suspect related to dehydration given history of incontinence, cognitive impairment and hyaline casts present on ED UA.  Has history of CKD 3 however creatinine on CMP done in ED actually improved from previous, no CVA tenderness.  No blood in urine to suggest stone obstruction.  Advised increasing water consumption and frequent urination to increase hydration and prevent incontinence.  Trace leaks, trace bacteria on today's UA, will treat for UTI.  Urine sent for culture.  Generalized abdominal pain Unclear etiology.  Tenderness to palpation diffusely without rebound however some guarding.  No focal tenderness or rigidity to suggest surgical abdomen.  Some concern for malignancy given pain and early satiety, however unclear reliability of history given known cognitive impairment.  Low suspicion for infection given afebrile, stable vitals, no leukocytosis on ED CBC.  Most recent A1c 6.1, low suspicion for gastroparesis.  May be related to urine infection however no suprapubic tenderness and no dysuria.  Will obtain CT abdomen pelvis to better assess.  Follow-up in 1 week.  Essential hypertension Low today.  Reports blood pressure usually runs low.  Last cardiology note 12/2374 with systolic 283 and encouraged  to continue current medication regimen.  Does report some lightheadedness with position changes.  Likely orthostatic given UA evidence of dehydration.  Mucous membranes moist with well-perfused extremities, do not feel the need for parenteral hydration at this time.  Encouraged to drink more water.  Orders Placed This Encounter  Procedures  . Urine Culture  . CT Abdomen Pelvis Wo Contrast      Order Specific Question:   ** REASON FOR EXAM (FREE TEXT)    Answer:   associated with decreased urination, diffuse abdominal pain, CKD3    Order Specific Question:   Preferred imaging location?    Answer:   [facility]    Order Specific Question:   Is Oral Contrast requested for this exam?    Answer:   Yes, Per Radiology protocol    Order Specific Question:   Radiology Contrast Protocol - do NOT remove file path    Answer:   [email or web address]  . POCT urinalysis dipstick  . POCT UA - Microscopic Only   Meds ordered this encounter  Medications  . nitrofurantoin, macrocrystal-monohydrate, (MACROBID) 100 MG capsule    Sig: Take 1 capsule (100 mg total) by mouth 2 (two) times daily for 5 days.    Dispense:  10 capsule    Refill:  0   Precepted with Dr. Nori Riis.  Rory Percy, DO PGY-2, [facility] 05/25/2018 6:05 PM

## 2018-05-25 NOTE — Telephone Encounter (Signed)
Pt went to ED and sat for 7 hours. They did draw blood and do a UA. She wants to know if we can tell her the results. Advised that I could send a message to MD, but she would get results faster if she came in for an ATC appt this pm.  She is agreeable to plan. [name], Salome Spotted, CMA

## 2018-05-25 NOTE — Assessment & Plan Note (Addendum)
Unclear etiology.  Tenderness to palpation diffusely without rebound however some guarding.  No focal tenderness or rigidity to suggest surgical abdomen.  Some concern for malignancy given pain and early satiety, however unclear reliability of history given known cognitive impairment.  Low suspicion for infection given afebrile, stable vitals, no leukocytosis on ED CBC.  Most recent A1c 6.1, low suspicion for gastroparesis.  May be related to urine infection however no suprapubic tenderness and no dysuria.  Will obtain CT abdomen pelvis to better assess.  Follow-up in 1 week.

## 2018-05-27 LAB — URINE CULTURE

## 2018-05-28 ENCOUNTER — Telehealth: Payer: Self-pay | Admitting: Family Medicine

## 2018-05-28 NOTE — Telephone Encounter (Signed)
Called to inform patient of negative urine culture results. She states she took one dose of antibiotics but then stated she felt her hands and feet go cold with difficulty with sensation as well as pressure in chest that self-resolved within 30 minutes and has not returned. She did not take any other doses of antibiotics. She has been using Azo. Her abdominal pain is improved although is still having discomfort with passing urine. Thinks her chronic neck pain is related. Explained that since her urine culture is negative, it is likely reasonable to not continue the antibiotics although the reaction she had is likely not related to the antibiotics. Encouraged her to get the CT scan scheduled for Thursday 10/31. Advised if she is still having pain after obtaining the CT scan, she should be seen to follow up. If she develops fevers or additional chest pain, she should go to the ED. Patient verbalized understanding.  Rory Percy, DO PGY-2, [facility] 05/28/2018 2:03 PM

## 2018-05-31 ENCOUNTER — Ambulatory Visit (INDEPENDENT_AMBULATORY_CARE_PROVIDER_SITE_OTHER): Payer: Medicare Other | Admitting: *Deleted

## 2018-05-31 ENCOUNTER — Ambulatory Visit (HOSPITAL_COMMUNITY)
Admission: RE | Admit: 2018-05-31 | Discharge: 2018-05-31 | Disposition: A | Discharge status: Home / Self Care | Payer: Medicare Other | Source: Ambulatory Visit | Attending: Family Medicine | Admitting: Family Medicine

## 2018-05-31 DIAGNOSIS — R339 Retention of urine, unspecified: Secondary | ICD-10-CM | POA: Insufficient documentation

## 2018-05-31 DIAGNOSIS — M545 Low back pain: Secondary | ICD-10-CM | POA: Diagnosis not present

## 2018-05-31 DIAGNOSIS — G20 Parkinson's disease: Secondary | ICD-10-CM | POA: Diagnosis not present

## 2018-05-31 DIAGNOSIS — R1084 Generalized abdominal pain: Secondary | ICD-10-CM | POA: Diagnosis not present

## 2018-05-31 DIAGNOSIS — I5022 Chronic systolic (congestive) heart failure: Secondary | ICD-10-CM

## 2018-05-31 DIAGNOSIS — N183 Chronic kidney disease, stage 3 (moderate): Secondary | ICD-10-CM | POA: Diagnosis not present

## 2018-05-31 DIAGNOSIS — K5732 Diverticulitis of large intestine without perforation or abscess without bleeding: Secondary | ICD-10-CM | POA: Diagnosis not present

## 2018-05-31 DIAGNOSIS — K5730 Diverticulosis of large intestine without perforation or abscess without bleeding: Secondary | ICD-10-CM | POA: Diagnosis not present

## 2018-05-31 DIAGNOSIS — I428 Other cardiomyopathies: Secondary | ICD-10-CM

## 2018-05-31 NOTE — Progress Notes (Signed)
Remote ICD transmission.   

## 2018-06-05 ENCOUNTER — Telehealth: Payer: Self-pay

## 2018-06-05 NOTE — Telephone Encounter (Signed)
-----   Message from Rory Percy, DO sent at 06/04/2018  5:06 PM EST ----- Please let patient know of normal results.  Thanks!

## 2018-06-05 NOTE — Telephone Encounter (Signed)
Pt contacted and informed.

## 2018-06-10 ENCOUNTER — Encounter: Payer: Self-pay | Admitting: Cardiology

## 2018-06-12 ENCOUNTER — Ambulatory Visit: Payer: Medicare Other

## 2018-06-14 DIAGNOSIS — H35351 Cystoid macular degeneration, right eye: Secondary | ICD-10-CM | POA: Diagnosis not present

## 2018-06-25 ENCOUNTER — Ambulatory Visit (INDEPENDENT_AMBULATORY_CARE_PROVIDER_SITE_OTHER): Payer: Medicare Other

## 2018-06-25 DIAGNOSIS — I5022 Chronic systolic (congestive) heart failure: Secondary | ICD-10-CM | POA: Diagnosis not present

## 2018-06-25 DIAGNOSIS — Z95810 Presence of automatic (implantable) cardiac defibrillator: Secondary | ICD-10-CM | POA: Diagnosis not present

## 2018-06-26 NOTE — Progress Notes (Signed)
EPIC Encounter for ICM Monitoring  Patient Name: Veronica Shaw is a 67 y.o. female Date: 06/26/2018 Primary Care Physican: Caroline More, DO Primary Cardiologist:Cooper/Weaver PA Electrophysiologist: Lovena Le Weight:130lbs                                       Heart Failure questions reviewed, pt asymptomatic.      Thoracic impedance normal.   Prescribed: Furosemide40 mgTake 0.5 tablets (20 mg total) by mouth daily as needed for fluid (For swelling).    Labs: 11/30/2017 Creatinine 1.45, BUN 22, Potassium 5.3, Sodium 140, EGFR 37-43 10/19/2017 Creatinine1.16, BUN18, Potassium4.1, Sodium139, TDHR41-63 10/18/2017 Creatinine1.24, BUN17, Potassium4.4, Sodium139, AGTX64-68  10/17/2017 Creatinine1.46, BUN23, Potassium4.8, Sodium135, EHOZ22-48  10/15/2017 Creatinine1.61, BUN24, Potassium4.0, GNOIBB048, GQBV69-45  Recommendations: No changes.  Encouraged to call for fluid symptoms.  Follow-up plan: ICM clinic phone appointment on 07/26/2018.   Office visit scheduled with Richardson Dopp, PA 07/13/2018  Copy of ICM check sent to Dr. Lovena Le.    3 month ICM trend: 06/26/2018    1 Year ICM trend:       [name], RN 06/26/2018 3:19 PM

## 2018-06-29 ENCOUNTER — Other Ambulatory Visit: Payer: Self-pay | Admitting: *Deleted

## 2018-06-29 NOTE — Patient Outreach (Signed)
[name]) Care Management  06/29/2018   Veronica Shaw 1950-08-21 [id number]  RN Health Coach telephone call to patient.  [employer] compliance verified. Per patient she is doing pretty good. Patient stated she has gained 2 lbs. Per patient she is weighing daily. Patient stated she is voiding well without difficulty. Patient has agreed with follow up outreach calls  Current Medications:  Current Outpatient Medications  Medication Sig Dispense Refill  . albuterol (PROVENTIL HFA;VENTOLIN HFA) 108 (90 Base) MCG/ACT inhaler Inhale 2 puffs into the lungs every 4 (four) hours as needed for wheezing or shortness of breath. 18 g 3  . albuterol (PROVENTIL) (2.5 MG/3ML) 0.083% nebulizer solution Take 3 mLs (2.5 mg total) by nebulization every 6 (six) hours as needed for wheezing or shortness of breath. 90 mL 12  . benzonatate (TESSALON) 200 MG capsule Take 1 capsule (200 mg total) by mouth 3 (three) times daily as needed for cough. (Patient not taking: Reported on 04/12/2018) 30 capsule 0  . Biotin 5000 MCG CAPS Take 1 tablet by mouth daily.    . Calcium Carb-Cholecalciferol 600-800 MG-UNIT TABS Take 1 tablet by mouth daily. 90 tablet 3  . carbidopa-levodopa (SINEMET CR) 50-200 MG tablet Take 1 tablet by mouth at bedtime. 90 tablet 1  . carbidopa-levodopa (SINEMET IR) 25-100 MG tablet TAKE 1 TABLET BY MOUTH THREE TIMES DAILY 270 tablet 1  . Coenzyme Q10 (CO Q-10) 100 MG CHEW Chew 1 Dose by mouth daily.    . furosemide (LASIX) 40 MG tablet Take 0.5 tablets (20 mg total) by mouth daily as needed for fluid (For swelling). 30 tablet 3  . gabapentin (NEURONTIN) 100 MG capsule Take 1 capsule (100 mg total) by mouth at bedtime. Take as needed. 30 capsule 0  . hydrocortisone ointment 0.5 % Apply 1 application topically 2 (two) times daily. To chest area 30 g 0  . Lactobacillus (PROBIOTIC ACIDOPHILUS PO) Take 1 tablet by mouth daily.    [name] Kitchen losartan (COZAAR) 25 MG tablet TAKE 1 TABLET EVERY DAY 90  tablet 2  . metFORMIN (GLUCOPHAGE) 500 MG tablet TAKE 2 TABLETS TWICE DAILY WITH A MEAL (Patient taking differently: TAKE 1000 mg TABLETS TWICE DAILY WITH A MEAL) 360 tablet 3  . metoprolol succinate (TOPROL-XL) 50 MG 24 hr tablet Take 1 tablet (50 mg total) by mouth 2 (two) times daily. 180 tablet 3  . mometasone-formoterol (DULERA) 100-5 MCG/ACT AERO Inhale 2 puffs into the lungs 2 (two) times daily. 1 Inhaler 5  . Multiple Vitamins-Minerals (OCUVITE ADULT 50+ PO) Take 1 tablet by mouth daily.    . phenazopyridine (PYRIDIUM) 200 MG tablet Take 1 tablet (200 mg total) by mouth 3 (three) times daily as needed for pain. 10 tablet 0  . potassium chloride (K-DUR,KLOR-CON) 10 MEQ tablet TAKE 1 TABLET DAILY AS NEEDED (TAKE WITH LASIX AS NEEDED FOR SWELLING). (Patient taking differently: TAKE 10 mg  TABLET DAILY AS NEEDED (TAKE WITH LASIX AS NEEDED FOR SWELLING).) 30 tablet 6  . ranitidine (ZANTAC) 150 MG tablet TAKE 1 TABLET TWICE DAILY 180 tablet 1  . spironolactone (ALDACTONE) 25 MG tablet Take 0.5 tablets (12.5 mg total) by mouth daily. 90 tablet 3  . Turmeric 500 MG TABS Take 1 tablet by mouth daily.     No current facility-administered medications for this visit.     Functional Status:  In your present state of health, do you have any difficulty performing the following activities: 02/12/2018 11/10/2017  Hearing? N N  Vision? Veronica Shaw  Y  Comment - wears glasses.   has cataracts  Difficulty concentrating or making decisions? Veronica Shaw  Walking or climbing stairs? Y Y  Dressing or bathing? N N  Doing errands, shopping? Y Y  Comment - goes with someone  Conservation officer, nature and eating ? N N  Using the Toilet? Y Y  Comment - uses a bedside potty at night  In the past six months, have you accidently leaked urine? Y Y  Do you have problems with loss of bowel control? Y Y  Managing your Medications? N N  Managing your Finances? N N  Housekeeping or managing your Housekeeping? N N  Some recent data might be  hidden    Fall/Depression Screening: Fall Risk  05/25/2018 05/03/2018 05/01/2018  Falls in the past year? No No Yes  Comment - - -  Number falls in past yr: - - 2 or more  Injury with Fall? - - Yes  Comment - - -  Risk Factor Category  - - High Fall Risk  Risk for fall due to : - - History of fall(s);Impaired balance/gait;Impaired mobility  Follow up - - Falls evaluation completed;Education provided;Falls prevention discussed   PHQ 2/9 Scores 05/25/2018 05/03/2018 05/01/2018 04/12/2018 03/19/2018 02/12/2018 11/30/2017  PHQ - 2 Score 0 0 0 0 0 0 0  PHQ- 9 Score - - - - - - -  Exception Documentation - - - - - - -   THN CM Care Plan Problem One     Most Recent Value  Care Plan Problem One  Knowledge Deficit in self management of chf  Role Documenting the Problem One  [place] for Problem One  Active  THN Long Term Goal   Patient will report no readmission for CHF and following up onhealth maintenance within the next 90days  THN Long Term Goal Start Date  06/29/18  Interventions for Problem One Long Term Goal  Patient has 2 lb wt gain today. Patient is able to verbalize that she will take the fluid pill. RN sent patient educational material on CHF exacerbation. RN will follow up for further discussion  THN CM Short Term Goal #1   Patient will verbalize starting some physical activity within the next 30 days  THN CM Short Term Goal #1 Start Date  06/29/18  Interventions for Short Term Goal #1  RN had discussed exercise routine. Patient eye surgery is complete so patient will be able to start. RN will follow up with further discussion and encouragenment  THN CM Short Term Goal #2   Patient will verbalize receiving 2020 calendar book within the next 30 days  THN CM Short Term Goal #2 Start Date  06/29/18  Interventions for Short Term Goal #2  RN discussed continue to weigh daily and document weights to show fluid gain/ RN sent 2020 calendar book for documentation      Assessment:   2 lb weight gain Patient is following the CHF action plan Plan:  RN discussed Heart failure action plan RN sent 2020 Calendar book RN sent educational material on Heart failure exacerbation RN will follow up within the month of January  [facility] Management [phone]

## 2018-07-02 ENCOUNTER — Other Ambulatory Visit: Payer: Self-pay | Admitting: Pharmacy Technician

## 2018-07-02 NOTE — Patient Outreach (Signed)
[place] [facility]) Care Management  07/02/2018  Veronica Shaw 22-Jul-1951 [id number]   Unsuccessful call in reference to reapplying for 2020 patient assistance, for [facility] & Proventil thru [employer] patient assistance.  Unfortunately patient did not answer the phone. HIPAA compliant voicemail left asking patient to return the call.  Amila Callies P. Jonny Dearden, [place] Management [phone]

## 2018-07-06 ENCOUNTER — Other Ambulatory Visit: Payer: Self-pay | Admitting: Pharmacy Technician

## 2018-07-06 NOTE — Patient Outreach (Signed)
[facility] [facility]) Care Management  07/06/2018  Veronica Shaw 16-Sep-1950 [id number]   Incoming call received from patient. She was returning my call. HIPAA identifiers obtained and verified.  Inquired if patient wanted to participate in the 2020 patient assistance with [employer] for 2020 for [facility] and Proventil HFA. Patient stated she would be interested in applying. Informed patient that I would be mailing her the application in the next few days. Informed patient to fill out the orange starred areas and mail back with enclosed envelope. Patient verbalized understanding.  Prepared patient and provider portions to be mailed/interoffice.  Will followup in 7-10 business days with patient to confirm receipt of application.  Endora Teresi P. Merric Yost, [facility] Management [phone]

## 2018-07-07 ENCOUNTER — Other Ambulatory Visit: Payer: Self-pay | Admitting: Cardiovascular Disease

## 2018-07-09 ENCOUNTER — Encounter: Payer: Self-pay | Admitting: Pharmacy Technician

## 2018-07-09 NOTE — Progress Notes (Signed)
Veronica Shaw was seen today in the movement disorders clinic for neurologic consultation at the request of Veronica More, DO.  The patient has previously been following with Dr. Kyra Shaw and her physician assistant, although she has not been seen at [facility] since March, 2015 and has not been seen by Dr. Hall Shaw since October, 2014.  I have reviewed [facility] records.  She was first seen by [facility] in August, 2014 and was diagnosed with probable Parkinson's disease and was started on levodopa at that visit.  Her complaints at that time were pain all over, loss of balance, and right hand tremor.  When she followed up in October, 2014 she was only taking the levodopa twice a day, but UPDRS motor score had greatly improved.  She was told to increase the carbidopa/levodopa 25/100-1-1/2 tablets 3 times a day, but when she followed up in March, 2015 she did not think that that made a significant difference.  She was told to continue the medication.  As above, she has not been seen back by the neurology clinic at [facility] since that time.  She is off of carbidopa/levodopa as she doesn't think that she has parkinsons disease.  She states that she has been off of the medication for the last 2 years.  She does state that she often goes to the right side and will lose balance    12/22/15 update:  The patient follows up today regarding Parkinson's disease.  She was started on pramipexole 0.5 mg and supposed to work up to tid.  She admits that she didn't take it.  Her sister told her not to.  She states that she "told me before that she tried it" but I reminded her that she tried levodopa but only took it bid and then she d/c it.  She was worried about dyskinesia.  She states that she went to a prayer meeting and thinks that she may be cured.  She admits that she occasionally uses a cane because she goes to the right.    She denies any compulsive behaviors.  Denies sleep attacks.  No falls.  No hallucinations.  No  lightheadedness or near syncope.  I did review records since last visit.  She underwent a right heart cath on 12/16/2015.  There was low filling pressures, but normal cardiac output.  Dr. Burt Shaw suspected her shortness of breath secondary to asthma.  She states that has limited her exercise activities.    07/01/16 update:  The patient follows up today regarding Parkinson's disease.  She refuses any medication for her disease.  Reports that she has had a few falls but "I have not had any broken bones."  She reports that she takes coconut oil now as she heard it was good for her on the news.  No hallucinations, lightheadedness or near syncope.  I have reviewed her records since our last visit.  No new cardiac issues, although she continues to follow with cardiology.  She does have an ICD.  She is having some headaches in the temporal region 2 times per week and last one hour.  It feels throbbing.  Bilateral temporal.  Go away on own without med.   No longer taking neurontin.  She is no longer going to gym but is exercising some at home.  Gym exercise made her asthma worse.  She is now raising her 30 year old grandson.    12/30/16 update:  Patient seen today in follow-up for her Parkinson's disease.  She has refused medicine for quite some time for her Parkinson's disease.  Pt denies falls.  Having some near falls.  Pt having some dizziness but told that she had fluid in her ear and has appt next week with ENT.   "I know its my ear because I have ear pain." She has had no near syncope.  No hallucinations.  Mood has been good.   Going to the gym and "I do some granny exercises but they work [place] out."  C/o R shoulder pain - states that her sciatica has moved to her shoulder.  10/09/17 update: Patient is seen today in follow-up for Parkinson's disease.  I have not seen her in 9 months.  She has previously refused all medication.  Records have been reviewed since last visit.  She brings a long list of sx's consisting of  leg cramping, confusion, constipation, tripping, and a "dopey feeling in my head."  Takes yellow mustard for cramping.  She has continued to follow-up with cardiology for her congestive heart failure.  She is having Shaw back pain.  She saw neurosx in the past but didn't have insurance then.  She is not on a schedule.  She is sometimes sleeping all day and sometimes having trouble with sleep at night.  Using melatonin.    01/24/18 update: Patient is seen today in follow-up for Parkinson's disease.  She was restarted on carbidopa/levodopa 25/100 last visit and worked to 1 tablet 3 times per day.  She reports that she is taking that 8am/noon/4pm.     Pt denies lightheadedness, near syncope.  No hallucinations.  Mood has been good.  Records have been reviewed since our last visit.  She was in the hospital not long after I saw her.  She had several syncopal episodes that was felt secondary to dehydration/influenza.  CT of the brain was performed as the patient hit her head when she fell and she had some confusion.  This was unremarkable.  She had a hard time recovering from the hospital stay and still feels weak.  She is no longer doing the [employer] exercise "granny group" as "I couldn't keep up."  Having significant nighttime leg cramping.  Having runny nose.    07/11/18 update: Patient seen today in follow-up for Parkinson's disease.  She is on carbidopa/levodopa 25/100, 1 tablet 3 times per day and the last visit carbidopa/levodopa 50/200 was added at bedtime for cramping.  She reports that she started that one but stopped it.  she is still having having the cramping.  She states that when she took it "I didn't feel right" but she cannot tell me what didn't feel right.  Pt denies falls.  Pt denies lightheadedness, near syncope.  No hallucinations.  Mood has been good.  The records that were made available to me were reviewed.   PREVIOUS MEDICATIONS: Sinemet (pt was only taking bid and didn't think helped); mirapex  (given but didn't take); tried CR at bed for cramping but "didn't feel right" and stopped it  ALLERGIES:   Allergies  Allergen Reactions  . Aspirin Swelling and Other (See Comments)    Other reaction(s): Other (See Comments) Causes nose bleeds *ONLY THE COATED ASA* Causes nose bleeds *ONLY THE COATED ASA*  . Penicillins Shortness Of Breath and Rash    Shortness of Breath - Throat felt like it was closing.   [name] [Conj Estrog-Medroxyprogest Ace] Shortness Of Breath    Throat swelling Throat swelling  . Ace Inhibitors Cough  .  Simvastatin Other (See Comments) and Rash    Muscle aches    CURRENT MEDICATIONS:  Outpatient Encounter Medications as of 07/11/2018  Medication Sig  . albuterol (PROVENTIL HFA;VENTOLIN HFA) 108 (90 Base) MCG/ACT inhaler Inhale 2 puffs into the lungs every 4 (four) hours as needed for wheezing or shortness of breath.  [name] Kitchen albuterol (PROVENTIL) (2.5 MG/3ML) 0.083% nebulizer solution Take 3 mLs (2.5 mg total) by nebulization every 6 (six) hours as needed for wheezing or shortness of breath.  . Biotin 5000 MCG CAPS Take 1 tablet by mouth daily.  . Calcium Carb-Cholecalciferol 600-800 MG-UNIT TABS Take 1 tablet by mouth daily.  . carbidopa-levodopa (SINEMET CR) 50-200 MG tablet Take 1 tablet by mouth at bedtime.  . carbidopa-levodopa (SINEMET IR) 25-100 MG tablet TAKE 1 TABLET BY MOUTH THREE TIMES DAILY  . Coenzyme Q10 (CO Q-10) 100 MG CHEW Chew 1 Dose by mouth daily.  . furosemide (LASIX) 40 MG tablet Take 0.5 tablets (20 mg total) by mouth daily as needed for fluid (For swelling).  . gabapentin (NEURONTIN) 100 MG capsule Take 1 capsule (100 mg total) by mouth at bedtime. Take as needed.  . Lactobacillus (PROBIOTIC ACIDOPHILUS PO) Take 1 tablet by mouth daily.  [name] Kitchen losartan (COZAAR) 25 MG tablet TAKE 1 TABLET EVERY DAY  . metFORMIN (GLUCOPHAGE) 500 MG tablet TAKE 2 TABLETS TWICE DAILY WITH A MEAL (Patient taking differently: TAKE 1000 mg TABLETS TWICE DAILY WITH  A MEAL)  . metoprolol succinate (TOPROL-XL) 50 MG 24 hr tablet Take 1 tablet (50 mg total) by mouth 2 (two) times daily.  . mometasone-formoterol (DULERA) 100-5 MCG/ACT AERO Inhale 2 puffs into the lungs 2 (two) times daily.  . Multiple Vitamins-Minerals (OCUVITE ADULT 50+ PO) Take 1 tablet by mouth daily.  . potassium chloride (K-DUR,KLOR-CON) 10 MEQ tablet TAKE 1 TABLET DAILY AS NEEDED (TAKE WITH LASIX AS NEEDED FOR SWELLING). (Patient taking differently: TAKE 10 mg  TABLET DAILY AS NEEDED (TAKE WITH LASIX AS NEEDED FOR SWELLING).)  . ranitidine (ZANTAC) 150 MG tablet TAKE 1 TABLET TWICE DAILY  . spironolactone (ALDACTONE) 25 MG tablet Take 0.5 tablets (12.5 mg total) by mouth daily.  . Turmeric 500 MG TABS Take 1 tablet by mouth daily.  . [DISCONTINUED] benzonatate (TESSALON) 200 MG capsule Take 1 capsule (200 mg total) by mouth 3 (three) times daily as needed for cough. (Patient not taking: Reported on 04/12/2018)  . [DISCONTINUED] hydrocortisone ointment 0.5 % Apply 1 application topically 2 (two) times daily. To chest area  . [DISCONTINUED] phenazopyridine (PYRIDIUM) 200 MG tablet Take 1 tablet (200 mg total) by mouth 3 (three) times daily as needed for pain.  . [DISCONTINUED] spironolactone (ALDACTONE) 25 MG tablet Take 0.5 tablets (12.5 mg total) by mouth daily.   No facility-administered encounter medications on file as of 07/11/2018.     PAST MEDICAL HISTORY:   Past Medical History:  Diagnosis Date  . Asthma   . Chronic systolic CHF (congestive heart failure) ([place])    a. cMRI 4/15: EF 34% and findings - c/w NICM, normal RV size and function (RVEF 61%), Mild MR // b. Echo 2/15:  EF 30-35%, diff HK, ant-sept AK, Gr 2 DD, mild MR, trivial TR  //  c. Echo 5/17: EF 20-25%, severe diffuse HK, marked systolic dyssynchrony, grade 1 diastolic dysfunction, mild MR  //  d. RHC 5/17: Fick CO 2.9, RVSP 19, PASP 15, PW mean 2, low filing pressures and preserved CO   . Diabetes mellitus   .  Gastritis   . History of echocardiogram    Echo 6/18: EF 30-35, diffuse HK, grade 1 diastolic dysfunction, trivial MR, mild LAE, mild TR, no pericardial effusion  . History of nuclear stress test    Myoview 5/18: EF 49, no ischemia, inferoseptal defect c/w LBBB artifact (intermediate risk due to EF < 50).  [name] Kitchen HTN (hypertension)   . Hyperlipidemia   . NICM (nonischemic cardiomyopathy) ([place])    a. Nuclear 5/13: Normal stress nuclear study. LV Ejection Fraction: 58%  //  b. LHC 10/14: Minor luminal irregularity in prox LAD, EF 35%   . Parkinson disease ([place])   . Plantar fasciitis   . Sleep apnea    was retested and no longer had it and so d/c CPAP  . Urticaria     PAST SURGICAL HISTORY:   Past Surgical History:  Procedure Laterality Date  . BUNIONECTOMY    . CARDIAC CATHETERIZATION N/A 12/16/2015   Procedure: Right Heart Cath;  Surgeon: Sherren Mocha, MD;  Location: [facility];  Service: Cardiovascular;  Laterality: N/A;  . CHOLECYSTECTOMY    . IMPLANTABLE CARDIOVERTER DEFIBRILLATOR IMPLANT  11-25-13   MDT dual chamber ICD implanted by [name] for primary prevention  . IMPLANTABLE CARDIOVERTER DEFIBRILLATOR IMPLANT N/A 11/25/2013   Procedure: IMPLANTABLE CARDIOVERTER DEFIBRILLATOR IMPLANT;  Surgeon: [name], MD;  Location: [facility] CATH LAB;  Service: Cardiovascular;  Laterality: N/A;  . LEFT AND RIGHT HEART CATHETERIZATION WITH CORONARY ANGIOGRAM N/A 05/31/2013   Procedure: LEFT AND RIGHT HEART CATHETERIZATION WITH CORONARY ANGIOGRAM;  Surgeon: [name], MD;  Location: [facility] C[facility] LAB;  Service: Cardiovascular;  Laterality: N/A;  . TONSILLECTOMY    . TUBAL LIGATION      SOCIAL HISTORY:   Social History   Socioeconomic History  . Marital status: Married    Spouse name: Not on file  . Number of children: 2  . Years of education: Not on file  . Highest education level: Not on file  Occupational History  . Occupation: retired    Comment: Proofreader  .  Financial resource strain: Not on file  . Food insecurity:    Worry: Not on file    Inability: Not on file  . Transportation needs:    Medical: Not on file    Non-medical: Not on file  Tobacco Use  . Smoking status: Never Smoker  . Smokeless tobacco: Never Used  . Tobacco comment: exposed to smoke during child hood (parents)  Substance and Sexual Activity  . Alcohol use: No    Alcohol/week: 0.0 standard drinks  . Drug use: No  . Sexual activity: Yes  Lifestyle  . Physical activity:    Days per week: Not on file    Minutes per session: Not on file  . Stress: Not on file  Relationships  . Social connections:    Talks on phone: Not on file    Gets together: Not on file    Attends religious service: Not on file    Active member of club or organization: Not on file    Attends meetings of clubs or organizations: Not on file    Relationship status: Not on file  . Intimate partner violence:    Fear of current or ex partner: Not on file    Emotionally abused: Not on file    Physically abused: Not on file    Forced sexual activity: Not on file  Other Topics Concern  . Not on file  Social  History Narrative   Lives at home with husband and the dog.    FAMILY HISTORY:   Family Status  Relation Name Status  . Father  Deceased       MI, heart disease  . Mother  Deceased       MI, stroke, DM  . MGM  Deceased  . MGF  Deceased  . PGM  Deceased  . PGF  Deceased  . Sister  Alive        heart disease x3  . Son  Alive       DM  . Daughter  Alive       healthy  . Sister  Alive  . Sister  Alive  . Neg Hx  (Not Specified)    ROS:   Review of Systems  Constitutional: Negative.   HENT: Negative.   Eyes: Negative.   Respiratory: Negative.   Cardiovascular: Negative.   Skin: Negative.     PHYSICAL EXAMINATION:    VITALS:   Vitals:   07/11/18 0915  BP: 92/64  Pulse: 74  SpO2: 96%  Weight: 134 lb (60.8 kg)  Height: 5\' 1"  (1.549 m)   Wt Readings from Last 3  Encounters:  07/11/18 134 lb (60.8 kg)  05/25/18 133 lb (60.3 kg)  05/03/18 132 lb (59.9 kg)     GEN:  The patient appears stated age and is in NAD. HEENT:  Normocephalic, atraumatic.  The mucous membranes are moist. The superficial temporal arteries are without ropiness or tenderness. CV:  RRR Lungs:  CTAB Neck/HEME:  There are no carotid bruits bilaterally.  Neurological examination:  Orientation: alert and oriented x 3.  Montreal Cognitive Assessment  10/19/2015  Visuospatial/ Executive (0/5) 4  Naming (0/3) 3  Attention: Read list of digits (0/2) 2  Attention: Read list of letters (0/1) 1  Attention: Serial 7 subtraction starting at 100 (0/3) 0  Language: Repeat phrase (0/2) 2  Language : Fluency (0/1) 0  Abstraction (0/2) 2  Delayed Recall (0/5) 0  Orientation (0/6) 6  Total 20  Adjusted Score (based on education) 21    Cranial nerves: There is good facial symmetry.  There is facial hypomimia.  The speech is fluent and clear.  She is hypophonic.  Soft palate rises symmetrically and there is no tongue deviation. Hearing is intact to conversational tone. Sensation: Sensation is intact to light touch throughout Motor: Strength is at least antigravity x4.  Movement examination: Tone: There is mild increased tone in the RUE Abnormal movements: There is no tremor today Coordination:  There is no decremation, with any form of RAMS, including alternating supination and pronation of the forearm, hand opening and closing, finger taps, heel taps and toe taps. Gait and Station: The patient pushes off of the chair.  She is not dragging her leg today.  She is somewhat slow.  She is not using a cane   ASSESSMENT/PLAN:  1.  Idiopathic Parkinson's disease.  The patient has tremor, bradykinesia, rigidity and mild postural instability.  Dx was made in 03/2013 at [facility].    -previously given mirapex but patient d/c it.   -He is now taking levodopa faithfully.  I think she needs Shaw,  but she has been very medication resistant and noncompliant in the past and I just got her to start taking it so I did not change it today.  -given Carbidopa/levodopa 50/200 last visit but stopped it.  She is therefore still having cramping.   Told her I could  reduce the dose to carbidopa/levodopa 25/100 CR at bedtime instead of the 50/200 but she refused.   2.  Lumbar spinal stenosis  -Having Shaw problems with back pain.  Told her to follow-up with her neurosurgeon. 3.  Syncope in March, 2019 as a result of influenza/dehydration  -Patient was confused while in the hospital.  Talked to her about getting neurocognitive testing.  She is doing better now.  She was not really confused in the office today.  Wants to hold. 4.  F/u 6 months

## 2018-07-11 ENCOUNTER — Ambulatory Visit (INDEPENDENT_AMBULATORY_CARE_PROVIDER_SITE_OTHER): Payer: Medicare Other | Admitting: Neurology

## 2018-07-11 ENCOUNTER — Encounter: Payer: Self-pay | Admitting: Neurology

## 2018-07-11 VITALS — BP 92/64 | HR 74 | Ht 61.0 in | Wt 134.0 lb

## 2018-07-11 DIAGNOSIS — G20 Parkinson's disease: Secondary | ICD-10-CM

## 2018-07-11 NOTE — Patient Instructions (Signed)
You are looking well.  Let me know if you want to try a lower dose of the extended release carbidopa/levodopa at bedtime for the cramping.  Merry Christmas!

## 2018-07-13 ENCOUNTER — Ambulatory Visit: Payer: Medicare Other | Admitting: Physician Assistant

## 2018-07-18 ENCOUNTER — Other Ambulatory Visit: Payer: Self-pay | Admitting: Pharmacy Technician

## 2018-07-18 NOTE — Patient Outreach (Signed)
[place] [facility]) Care Management  07/18/2018  LARIA GRIMMETT 02/27/1951 [id number]   Incoming call received from patient returning my call.  Spoke to [name], HIPPA identifiers verified. [name] informed me that she had put her application in the mail on 07/16/18.  Will followup with patient in 10-14 business days if applications have not been received.  Sariah Henkin P. Stacie Knutzen, [place] Management [phone]

## 2018-07-18 NOTE — Patient Outreach (Signed)
[place] [facility]) Care Management  07/18/2018  Veronica Shaw 1951/05/02 [id number]   Unsuccessful outreach call placed to patient in regards to [employer] patient assistance applications for [employer] and Proventil.  Was inquiring if the patient had received her applications but unfortunately patient did not answer the phone. HIPPA compliant voicemail message left asking for a return call.  Will followup with 2nd attempt in 3-5 business days if call is not returned.  Chai Routh P. Mir Fullilove, [place] Management [phone]

## 2018-07-23 ENCOUNTER — Other Ambulatory Visit: Payer: Self-pay | Admitting: Pharmacy Technician

## 2018-07-23 NOTE — Patient Outreach (Signed)
[place] [facility]) Care Management  07/23/2018  Veronica Shaw 13-Feb-1951 [id number]  Received all necessary documents and signautres from both patient and provider for [employer] patient assistance for [facility] & Proventil.  Submitted completed application via mail to [employer].  Will followup with [employer] in 10-14 business days to inquire if they had mailed out the attestation letter to the patient.  Veronica Shaw P. Trevionne Advani, [place] Management [phone]

## 2018-07-26 ENCOUNTER — Ambulatory Visit (INDEPENDENT_AMBULATORY_CARE_PROVIDER_SITE_OTHER): Payer: Medicare Other

## 2018-07-26 DIAGNOSIS — I5022 Chronic systolic (congestive) heart failure: Secondary | ICD-10-CM

## 2018-07-26 DIAGNOSIS — Z95810 Presence of automatic (implantable) cardiac defibrillator: Secondary | ICD-10-CM

## 2018-07-27 ENCOUNTER — Other Ambulatory Visit: Payer: Self-pay | Admitting: Pharmacy Technician

## 2018-07-27 NOTE — Progress Notes (Addendum)
EPIC Encounter for ICM Monitoring  Patient Name: Veronica Shaw is a 67 y.o. female Date: 07/27/2018 Primary Care Physican: Abraham, Sherin, DO Primary Cardiologist:Cooper/Weaver PA Electrophysiologist: Taylor Previous Weight:130lbs  Transmission reviewed.   Thoracic impedance normal.   Prescribed:Furosemide40 mgTake 0.5 tablets (20 mg total) by mouth daily as needed for fluid (For swelling).    Labs: 05/24/2018 Cretainine 1.26, BUN 25, Potassium 4.1, Sodium 140, eGFR 42-49 11/30/2017 Creatinine 1.45, BUN 22, Potassium 5.3, Sodium 140, EGFR 37-43 10/19/2017 Creatinine1.16, BUN18, Potassium4.1, Sodium139, EGFR47-55 10/18/2017 Creatinine1.24, BUN17, Potassium4.4, Sodium139, EGFR44-51  10/17/2017 Creatinine1.46, BUN23, Potassium4.8, Sodium135, EGFR36-41  10/15/2017 Creatinine1.61, BUN24, Potassium4.0, Sodium133, EGFR32-37  Recommendations:None  Follow-up plan: ICM clinic phone appointment on1/30/2020. Office visit scheduled with Scott Weaver, PA 08/15/2018  Copy of ICM check sent to Dr.Taylor.    3 month ICM trend: 07/26/2018    1 Year ICM trend:       Laurie S Short, RN 07/27/2018 4:09 PM   

## 2018-07-27 NOTE — Patient Outreach (Signed)
[place] [facility]) Care Management  07/27/2018  Veronica Shaw 07-17-1951 [id number]   Successful outreach call placed to patient in regards to voicemail message she left for me.  Spoke to patient, HIPPA identifers verified. Informed patient that her [employer] application had been submitted and to be expecting an attestation letter from them in the next 10-14 business days. Informed patient to give me a call when she received the letter so we could go over it together. Patient verbalized understanding.  Will followup with patient in 10-14 business days if call has not been received.  Lorissa Kishbaugh P. Auset Fritzler, [place] Management [phone]

## 2018-07-31 LAB — CUP PACEART REMOTE DEVICE CHECK
Battery Remaining Longevity: 63 mo
Battery Voltage: 2.97 V
Brady Statistic AP VP Percent: 0 %
Brady Statistic AP VS Percent: 0 %
Brady Statistic AS VP Percent: 0.03 %
Brady Statistic AS VS Percent: 99.97 %
Brady Statistic RA Percent Paced: 0.01 %
Brady Statistic RV Percent Paced: 0.03 %
Date Time Interrogation Session: 20191031092704
HighPow Impedance: 66 Ohm
Implantable Lead Implant Date: 20150427
Implantable Lead Implant Date: 20150427
Implantable Lead Location: 753859
Implantable Lead Location: 753860
Implantable Lead Model: 5076
Implantable Lead Model: 6935
Implantable Pulse Generator Implant Date: 20150427
Lead Channel Impedance Value: 361 Ohm
Lead Channel Impedance Value: 4047 Ohm
Lead Channel Impedance Value: 4047 Ohm
Lead Channel Impedance Value: 4047 Ohm
Lead Channel Impedance Value: 475 Ohm
Lead Channel Impedance Value: 551 Ohm
Lead Channel Pacing Threshold Amplitude: 0.5 V
Lead Channel Pacing Threshold Amplitude: 0.5 V
Lead Channel Pacing Threshold Pulse Width: 0.4 ms
Lead Channel Pacing Threshold Pulse Width: 0.4 ms
Lead Channel Sensing Intrinsic Amplitude: 10.75 mV
Lead Channel Sensing Intrinsic Amplitude: 10.75 mV
Lead Channel Sensing Intrinsic Amplitude: 2 mV
Lead Channel Sensing Intrinsic Amplitude: 2 mV
Lead Channel Setting Pacing Amplitude: 2 V
Lead Channel Setting Pacing Amplitude: 2.5 V
Lead Channel Setting Pacing Pulse Width: 0.4 ms
Lead Channel Setting Sensing Sensitivity: 0.3 mV

## 2018-08-13 ENCOUNTER — Other Ambulatory Visit: Payer: Self-pay | Admitting: Pharmacy Technician

## 2018-08-13 DIAGNOSIS — K219 Gastro-esophageal reflux disease without esophagitis: Secondary | ICD-10-CM | POA: Diagnosis not present

## 2018-08-13 DIAGNOSIS — R0602 Shortness of breath: Secondary | ICD-10-CM | POA: Diagnosis not present

## 2018-08-13 NOTE — Patient Outreach (Signed)
[place] [facility]) Care Management  08/13/2018  SADIA BELFIORE 22-Oct-1950 [id number]    Care coordination call placed to [employer] patient assistance in regards to patient's dulera and proventil applications.  Spoke to [place] who said they application has not been received in their system yet. She did state that they have a back log of applications to be processed. She suggested calling back in 7-10 business days to check on the status of the application.  Will followup with [employer] in 7-10 business days to inquire on status of application.  Ciani Rutten P. Jamus Loving, [place] Management [phone]

## 2018-08-14 NOTE — Progress Notes (Signed)
Cardiology Office Note:    Date:  08/15/2018   ID:  TAIA BRAMLETT, DOB Jul 26, 1951, MRN [id number]  PCP:  Caroline More, DO  Cardiologist:  Sherren Mocha, MD   Electrophysiologist:  Cristopher Peru, MD  Pulmonologist: Simonne Maffucci, MD Neurologist: Alonza Bogus, DO  Referring MD: Caroline More, DO   Chief Complaint  Patient presents with  . Follow-up    CHF     History of Present Illness:    Veronica Shaw is a 68 y.o. female with  nonischemic cardiomyopathy, systolic CHF, s/p AICD 09/7104, diabetes, HL, sleep apnea, asthma, Parkinson's disease.Echocardiogram in 5/17 demonstrated EF 26-94%, mild diastolic dysfunction and mild MR. RHC in 5/17 demonstrated preserved cardiac output and low intracardiac filling pressures.  She has continued to have significant shortness of breath.  She was admitted in 09/2017 with syncope.  Echocardiogram demonstrated normal LV function and moderate diastolic dysfunction.  She was last seen in 10/2017.     [name] returns for follow up. She is here alone. She has recently been having epigastric and chest burning.  This is worse at night and with eating.  She saw GI and was put on famotidine and esomeprazole.  She thinks she may be a little better. She also notes increasing shortness of breath and wheezing.  She awakens with shortness of breath.  She has been using her nebulizer more.  She denies syncope, leg swelling.  She has noted R hand and R foot numbness.  She also notes leg cramps.   Prior CV studies:   The following studies were reviewed today:  Echo 10/18/17 EF 60-65, normal wall motion, grade 2 diastolic dysfunction  Echo 01/11/17 EF 30-35, diffuse HK, grade 1 diastolic dysfunction, trivial MR, mild LAE, mild TR  Nuclear stress test 12/29/16 Large size, moderate intensity fixed inferoseptal/septal perfusion defect consistent with LBBB artifact. No reversible ischemia. LVEF 49% with incoordinate septal motion. This is an intermediate risk study  (due to LVEF <50).  RHC 12/16/15 CO 2.9; mean RA 1, PASP 15, mean PA 10, mean PCWP 2 1. Low intracardiac filling pressures 2. Preserved cardiac output  Echo 12/11/15 EF 20-25%, severe diffuse HK, marked systolic dyssynchrony, grade 1 diastolic dysfunction, mild MR  Cardiac MRI (4/15): 1. Mild LVE, EF 34%, Diff HK, paradoxical septal motion c/w LBBB, small focal area of late gadolinium enhancement at basal anteroseptum - may represent RV volume overload or a focal infiltrative disease such as sarcoidosis, but certainly wouldn't explain the degree of of LV dysfunction. - c/w NICM, normal RV size and function (RVEF 61%), Mild MR  LHC (10/14): Minor luminal irregularity in prox LAD, EF 35%  Echo (2/15): EF 30-35%, diff HK, ant-sept AK, Gr 2 DD, mild MR, trivial TR  Nuclear (5/13): Normal stress nuclear study. LV Ejection Fraction: 58%  Past Medical History:  Diagnosis Date  . Asthma   . Chronic systolic CHF (congestive heart failure) ([place])    a. cMRI 4/15: EF 34% and findings - c/w NICM, normal RV size and function (RVEF 61%), Mild MR // b. Echo 2/15:  EF 30-35%, diff HK, ant-sept AK, Gr 2 DD, mild MR, trivial TR  //  c. Echo 5/17: EF 20-25%, severe diffuse HK, marked systolic dyssynchrony, grade 1 diastolic dysfunction, mild MR  //  d. RHC 5/17: Fick CO 2.9, RVSP 19, PASP 15, PW mean 2, low filing pressures and preserved CO   . Diabetes mellitus   . Gastritis   . History of echocardiogram  Echo 6/18: EF 30-35, diffuse HK, grade 1 diastolic dysfunction, trivial MR, mild LAE, mild TR, no pericardial effusion  . History of nuclear stress test    Myoview 5/18: EF 49, no ischemia, inferoseptal defect c/w LBBB artifact (intermediate risk due to EF < 50).  [name] Kitchen HTN (hypertension)   . Hyperlipidemia   . NICM (nonischemic cardiomyopathy) ([place])    a. Nuclear 5/13: Normal stress nuclear study. LV Ejection Fraction: 58%  //  b. LHC 10/14: Minor luminal irregularity in prox LAD, EF 35%    . Parkinson disease ([place])   . Plantar fasciitis   . Sleep apnea    was retested and no longer had it and so d/c CPAP  . Urticaria    Surgical Hx: The patient  has a past surgical history that includes Tubal ligation; Cholecystectomy; Bunionectomy; Implantable cardioverter defibrillator implant (11-25-13); left and right heart catheterization with coronary angiogram (N/A, 05/31/2013); implantable cardioverter defibrillator implant (N/A, 11/25/2013); Tonsillectomy; and Cardiac catheterization (N/A, 12/16/2015).   Current Medications: Current Meds  Medication Sig  . albuterol (PROVENTIL HFA;VENTOLIN HFA) 108 (90 Base) MCG/ACT inhaler Inhale 2 puffs into the lungs every 4 (four) hours as needed for wheezing or shortness of breath.  [name] Kitchen albuterol (PROVENTIL) (2.5 MG/3ML) 0.083% nebulizer solution Take 3 mLs (2.5 mg total) by nebulization every 6 (six) hours as needed for wheezing or shortness of breath.  . Biotin 5000 MCG CAPS Take 1 tablet by mouth daily.  . Calcium Carb-Cholecalciferol 600-800 MG-UNIT TABS Take 1 tablet by mouth daily.  . carbidopa-levodopa (SINEMET CR) 50-200 MG tablet Take 1 tablet by mouth at bedtime.  . carbidopa-levodopa (SINEMET IR) 25-100 MG tablet TAKE 1 TABLET BY MOUTH THREE TIMES DAILY  . Coenzyme Q10 (CO Q-10) 100 MG CHEW Chew 1 Dose by mouth daily.  [name] Kitchen esomeprazole (NEXIUM) 20 MG capsule Take 20 mg by mouth daily at 12 noon.   . famotidine (PEPCID) 20 MG tablet Take 20 mg by mouth 2 (two) times daily.   . furosemide (LASIX) 40 MG tablet Take 0.5 tablets (20 mg total) by mouth daily as needed for fluid (For swelling).  . gabapentin (NEURONTIN) 100 MG capsule Take 1 capsule (100 mg total) by mouth at bedtime. Take as needed.  . Lactobacillus (PROBIOTIC ACIDOPHILUS PO) Take 1 tablet by mouth daily.  [name] Kitchen losartan (COZAAR) 25 MG tablet TAKE 1 TABLET EVERY DAY  . metFORMIN (GLUCOPHAGE) 500 MG tablet Take 1,000 mg by mouth 2 (two) times daily with a meal.  . metoprolol  succinate (TOPROL-XL) 50 MG 24 hr tablet Take 1 tablet (50 mg total) by mouth 2 (two) times daily.  . mometasone-formoterol (DULERA) 100-5 MCG/ACT AERO Inhale 2 puffs into the lungs 2 (two) times daily.  . Multiple Vitamins-Minerals (OCUVITE ADULT 50+ PO) Take 1 tablet by mouth daily.  . potassium chloride (K-DUR,KLOR-CON) 10 MEQ tablet Take 10 mEq by mouth as needed (take with lasix as needed for swelling).  [name] Kitchen spironolactone (ALDACTONE) 25 MG tablet Take 0.5 tablets (12.5 mg total) by mouth daily.  . Turmeric 500 MG TABS Take 1 tablet by mouth daily.  . [DISCONTINUED] metFORMIN (GLUCOPHAGE) 500 MG tablet TAKE 2 TABLETS TWICE DAILY WITH A MEAL (Patient taking differently: TAKE 1000 mg TABLETS TWICE DAILY WITH A MEAL)  . [DISCONTINUED] potassium chloride (K-DUR,KLOR-CON) 10 MEQ tablet TAKE 1 TABLET DAILY AS NEEDED (TAKE WITH LASIX AS NEEDED FOR SWELLING). (Patient taking differently: TAKE 10 mg  TABLET DAILY AS NEEDED (TAKE WITH LASIX AS NEEDED FOR SWELLING).)  . [  DISCONTINUED] ranitidine (ZANTAC) 150 MG tablet TAKE 1 TABLET TWICE DAILY     Allergies:   Aspirin; Penicillins; Prempro [conj estrog-medroxyprogest ace]; Ace inhibitors; and Simvastatin   Social History   Tobacco Use  . Smoking status: Never Smoker  . Smokeless tobacco: Never Used  . Tobacco comment: exposed to smoke during child hood (parents)  Substance Use Topics  . Alcohol use: No    Alcohol/week: 0.0 standard drinks  . Drug use: No     Family Hx: The patient's family history includes Coronary artery disease in her father and mother; Diabetes in her father, mother, sister, and sister; Diabetes (age of onset: 25) in her son; Heart attack in her father and mother; Heart disease in her sister, sister, and sister; Hepatitis C in her sister; Parkinson's disease in her sister; Stroke in her mother. There is no history of Breast cancer.  ROS:   Please see the history of present illness.    Review of Systems  Cardiovascular:  Positive for chest pain and dyspnea on exertion.  Respiratory: Positive for cough, shortness of breath and wheezing.   Skin: Positive for rash.  Neurological: Positive for dizziness and loss of balance.   All other systems reviewed and are negative.   EKGs/Labs/Other Test Reviewed:    EKG:  EKG is  ordered today.  The ekg ordered today demonstrates NSR, HR 76, LBBB, no changes.   Recent Labs: 10/15/2017: B Natriuretic Peptide 26.9 10/19/2017: TSH 0.458 05/24/2018: ALT 6; Hemoglobin 12.8; Platelets 180   Recent Lipid Panel Lab Results  Component Value Date/Time   CHOL 172 05/31/2013 04:35 AM   TRIG 142 05/31/2013 04:35 AM   HDL 49 05/31/2013 04:35 AM   CHOLHDL 3.5 05/31/2013 04:35 AM   LDLCALC 95 05/31/2013 04:35 AM   LDLDIRECT 111 (H) 02/10/2015 11:45 AM    Physical Exam:    VS:  BP (!) 116/57   Pulse 76   Ht 5\' 1"  (1.549 m)   Wt 136 lb (61.7 kg)   LMP 08/01/2000 (Approximate)   SpO2 97%   BMI 25.70 kg/m     Wt Readings from Last 3 Encounters:  08/15/18 136 lb (61.7 kg)  07/11/18 134 lb (60.8 kg)  05/25/18 133 lb (60.3 kg)     Physical Exam  Constitutional: She is oriented to person, place, and time. She appears well-developed and well-nourished. No distress.  HENT:  Head: Normocephalic and atraumatic.  Eyes: No scleral icterus.  Neck: Neck supple. No JVD present. No thyromegaly present.  Cardiovascular: Normal rate, regular rhythm, S1 normal, S2 normal and normal heart sounds.  No murmur heard. Pulmonary/Chest: Effort normal. She has no rales.  Abdominal: Soft. She exhibits no distension. There is no hepatomegaly. There is abdominal tenderness in the epigastric area.  Musculoskeletal:        General: No edema.  Lymphadenopathy:    She has no cervical adenopathy.  Neurological: She is alert and oriented to person, place, and time.  Skin: Skin is warm and dry.  Psychiatric: She has a normal mood and affect.    ASSESSMENT & PLAN:    Other chest pain She  notes epigastric pain and increased shortness of breath. Her symptoms sound GI in nature.  I suspect this is making her asthma worse.  She feels a little better with H2RA and PPI Rx.  She had a normal cath in 2014 and no ischemia on stress testing in 2018.  Her ECG shows a chronic LBBB.  At this point,  I do not think she needs a stress test.  I have recommended she continue on her GI regimen and follow up with GI.  If her chest pain worsens, we can consider a stress test.    Chronic systolic heart failure ([place]) EF returned to normal by Echo in 3/19.  She notes more shortness of breath.  Her exam does not suggest volume overload.  ICM clinic follow up in 07/2018 demonstrated normal thoracic impedance.  I will get a BMET, BNP.  If her BNP is elevated, I will have her take Lasix for several days.  Continue current dose of beta-blocker, angiotensin receptor blocker, spironolactone.    Essential hypertension - The patient's blood pressure is controlled on her current regimen.  Continue current therapy.    ICD (implantable cardioverter-defibrillator), dual, in situ Follow up with EP as planned.   Paresthesias I have asked her to follow up with neurology to have this evaluated further   Dispo:  Return in about 6 months (around 02/13/2019) for Routine Follow Up, w/ Dr. Burt Knack, or Richardson Dopp, PA-C.   Medication Adjustments/Labs and Tests Ordered: Current medicines are reviewed at length with the patient today.  Concerns regarding medicines are outlined above.  Tests Ordered: Orders Placed This Encounter  Procedures  . Basic metabolic panel  . Pro b natriuretic peptide (BNP)  . EKG 12-Lead   Medication Changes: No orders of the defined types were placed in this encounter.   Signed, Richardson Dopp, PA-C  08/15/2018 4:29 PM    [facility] [place], [place], [place]  [address] Phone: [phone]; Fax: [phone]

## 2018-08-15 ENCOUNTER — Ambulatory Visit (INDEPENDENT_AMBULATORY_CARE_PROVIDER_SITE_OTHER): Payer: Medicare Other | Admitting: Physician Assistant

## 2018-08-15 ENCOUNTER — Encounter (INDEPENDENT_AMBULATORY_CARE_PROVIDER_SITE_OTHER): Payer: Self-pay

## 2018-08-15 ENCOUNTER — Encounter: Payer: Self-pay | Admitting: Physician Assistant

## 2018-08-15 VITALS — BP 116/57 | HR 76 | Ht 61.0 in | Wt 136.0 lb

## 2018-08-15 DIAGNOSIS — R0789 Other chest pain: Secondary | ICD-10-CM

## 2018-08-15 DIAGNOSIS — I5022 Chronic systolic (congestive) heart failure: Secondary | ICD-10-CM | POA: Diagnosis not present

## 2018-08-15 DIAGNOSIS — R202 Paresthesia of skin: Secondary | ICD-10-CM

## 2018-08-15 DIAGNOSIS — I10 Essential (primary) hypertension: Secondary | ICD-10-CM

## 2018-08-15 DIAGNOSIS — Z95810 Presence of automatic (implantable) cardiac defibrillator: Secondary | ICD-10-CM

## 2018-08-15 NOTE — Patient Instructions (Signed)
Medication Instructions:  Your physician recommends that you continue on your current medications as directed. Please refer to the Current Medication list given to you today.   If you need a refill on your cardiac medications before your next appointment, please call your pharmacy.   Lab work: TODAY: BMET, BNP,  If you have labs (blood work) drawn today and your tests are completely normal, you will receive your results only by: [name] Kitchen MyChart Message (if you have MyChart) OR . A paper copy in the mail If you have any lab test that is abnormal or we need to change your treatment, we will call you to review the results.  Testing/Procedures: NONE  Follow-Up: At [facility], you and your health needs are our priority.  As part of our continuing mission to provide you with exceptional heart care, we have created designated Provider Care Teams.  These Care Teams include your primary Cardiologist (physician) and Advanced Practice Providers (APPs -  Physician Assistants and Nurse Practitioners) who all work together to provide you with the care you need, when you need it. You will need a follow up appointment in:  6 months.  Please call our office 2 months in advance to schedule this appointment.  You may see Sherren Mocha, MD or Richardson Dopp, PA-C  Any Other Special Instructions Will Be Listed Below (If Applicable).

## 2018-08-16 LAB — BASIC METABOLIC PANEL
BUN/Creatinine Ratio: 13 (ref 12–28)
BUN: 16 mg/dL (ref 8–27)
CO2: 23 mmol/L (ref 20–29)
Calcium: 10.1 mg/dL (ref 8.7–10.3)
Chloride: 101 mmol/L (ref 96–106)
Creatinine, Ser: 1.26 mg/dL — ABNORMAL HIGH (ref 0.57–1.00)
GFR calc Af Amer: 50 mL/min/{1.73_m2} — ABNORMAL LOW (ref >59)
GFR calc non Af Amer: 44 mL/min/{1.73_m2} — ABNORMAL LOW (ref >59)
Glucose: 112 mg/dL — ABNORMAL HIGH (ref 65–99)
Potassium: 4.2 mmol/L (ref 3.5–5.2)
Sodium: 141 mmol/L (ref 134–144)

## 2018-08-16 LAB — PRO B NATRIURETIC PEPTIDE: NT-Pro BNP: 310 pg/mL — ABNORMAL HIGH (ref 0–301)

## 2018-08-23 ENCOUNTER — Ambulatory Visit
Admission: RE | Admit: 2018-08-23 | Discharge: 2018-08-23 | Disposition: A | Discharge status: Home / Self Care | Payer: Medicare Other | Source: Ambulatory Visit | Attending: Family Medicine | Admitting: Family Medicine

## 2018-08-23 ENCOUNTER — Other Ambulatory Visit: Payer: Self-pay | Admitting: Pharmacy Technician

## 2018-08-23 DIAGNOSIS — Z1231 Encounter for screening mammogram for malignant neoplasm of breast: Secondary | ICD-10-CM | POA: Diagnosis not present

## 2018-08-23 NOTE — Patient Outreach (Signed)
[facility]) Care Management  08/23/2018  Veronica Shaw April 10, 1951 [id number]   Care coordination call placed to [employer] in regards to patient's application for  [facility] and Proventil HFA.  Spoke to [place] who said the application has not been received. He apologized for the inconvenience and said they had been delayed due to the holidays and an influx of applications. [name] suggested calling back on Monday to see if they had received the application.  Will followup in 3-5 business days to inquire if application was received.  [name] P. [name], [place] Management [phone]

## 2018-08-24 ENCOUNTER — Other Ambulatory Visit: Payer: Self-pay | Admitting: Pharmacy Technician

## 2018-08-24 NOTE — Patient Outreach (Signed)
[facility]) Care Management  08/24/2018  DEBERA STERBA 17-May-1951 [id number]  Care coordination call placed to [employer] in regards to patient's application for proventil and Dulera.  Spoke to [place] who said they had received the application on 11/07/8117 and had subsequently mailed out the attestation letter to the patient today 08/24/2018. She said the patient should receive the letter in 7-10 days.  Will followup with patient in 10-14 days to inquire if they have received the attestation letter and to inform them on how to proceed with the application process.  Darcey Demma P. Kapono Luhn, [place] Management [phone]

## 2018-08-27 ENCOUNTER — Encounter: Payer: Self-pay | Admitting: Adult Health

## 2018-08-27 ENCOUNTER — Telehealth: Payer: Self-pay | Admitting: Pulmonary Disease

## 2018-08-27 ENCOUNTER — Ambulatory Visit (INDEPENDENT_AMBULATORY_CARE_PROVIDER_SITE_OTHER): Payer: Medicare Other | Admitting: Adult Health

## 2018-08-27 ENCOUNTER — Other Ambulatory Visit: Payer: Self-pay | Admitting: *Deleted

## 2018-08-27 ENCOUNTER — Ambulatory Visit (INDEPENDENT_AMBULATORY_CARE_PROVIDER_SITE_OTHER)
Admission: RE | Admit: 2018-08-27 | Discharge: 2018-08-27 | Disposition: A | Discharge status: Home / Self Care | Payer: Medicare Other | Source: Ambulatory Visit | Attending: Adult Health | Admitting: Adult Health

## 2018-08-27 VITALS — BP 116/58 | HR 80 | Temp 97.6°F | Ht 61.0 in | Wt 135.4 lb

## 2018-08-27 DIAGNOSIS — J4530 Mild persistent asthma, uncomplicated: Secondary | ICD-10-CM

## 2018-08-27 DIAGNOSIS — R0602 Shortness of breath: Secondary | ICD-10-CM | POA: Diagnosis not present

## 2018-08-27 DIAGNOSIS — I5022 Chronic systolic (congestive) heart failure: Secondary | ICD-10-CM

## 2018-08-27 DIAGNOSIS — R05 Cough: Secondary | ICD-10-CM | POA: Diagnosis not present

## 2018-08-27 DIAGNOSIS — J4541 Moderate persistent asthma with (acute) exacerbation: Secondary | ICD-10-CM

## 2018-08-27 LAB — NITRIC OXIDE: Nitric Oxide: 82

## 2018-08-27 MED ORDER — PREDNISONE 10 MG PO TABS: ORAL_TABLET | ORAL | 0 refills | Status: DC

## 2018-08-27 MED ORDER — LEVALBUTEROL HCL 0.63 MG/3ML IN NEBU
0.6300 mg | INHALATION_SOLUTION | Freq: Once | RESPIRATORY_TRACT | Status: AC
  Administered 2018-08-27: 0.63 mg via RESPIRATORY_TRACT

## 2018-08-27 MED ORDER — LEVALBUTEROL HCL 0.63 MG/3ML IN NEBU: INHALATION_SOLUTION | RESPIRATORY_TRACT | 1 refills | Status: DC

## 2018-08-27 MED ORDER — MOMETASONE FURO-FORMOTEROL FUM 200-5 MCG/ACT IN AERO: 2.0000 | INHALATION_SPRAY | Freq: Two times a day (BID) | RESPIRATORY_TRACT | 0 refills | Status: DC

## 2018-08-27 MED ORDER — AZITHROMYCIN 250 MG PO TABS: ORAL_TABLET | ORAL | 0 refills | Status: AC

## 2018-08-27 NOTE — Patient Instructions (Addendum)
Zpack take as directed  Mucinex DM Twice daily  As needed  Cough/congestion  Prednisone taper over next week.  Increase Dulera 200 2 puffs Twice daily  Until sample is gone , then resume Dulera 100 2 puffs Twice daily  , rinse after use.  Chest xray today .  Change Albuterol to Xopenex Neb 1 every 4-6 hrs as needed for wheezing .  Follow up with Dr. Lake Bells or [place] NP in 2 weeks and As needed   Please contact office for sooner follow up if symptoms do not improve or worsen or seek emergency care

## 2018-08-27 NOTE — Progress Notes (Signed)
@Patient  ID: Veronica Shaw, female    DOB: [birth year], 68 y.o.   MRN: [id number]  No chief complaint on file.   Referring provider: Caroline More, DO  HPI: 68 year old female never smoker followed for chronic asthma Past medical history significant for CHF, rheumatoid arthritis, diabetes and Parkinson's disease    TEST  Spirometry January 2017 FEV1 65%, ratio 71, FVC 72% PFT July 2016 FEV1 93%, ratio 79, FVC 89%, significant bronchodilator response, DLCO is 107%. 2D echo March 2019 EF 12-87%, grade 2 diastolic dysfunction  8/67/6720  Acute OV : Asthma  Patient presents for an acute office visit.  She complains of 2 weeks of increased cough congestion with thick yellow and white mucus chest tightness wheezing and increased shortness of breath.  Patient says she has been coughing up some thick mucus that is hard to get up at times.  She has some nasal drainage.  She denies any fever, chest pain, orthopnea, increased edema. She was seen by cardiology 2 weeks ago.  BNP at that time was mildly elevated at 310 previously was 244.  She was given Lasix for a few days.  She denies any leg swelling.  Today exhaled nitric oxide testing is elevated at 82 ppb .   Allergies  Allergen Reactions  . Aspirin Swelling and Other (See Comments)    Other reaction(s): Other (See Comments) Causes nose bleeds *ONLY THE COATED ASA* Causes nose bleeds *ONLY THE COATED ASA*  . Penicillins Shortness Of Breath and Rash    Shortness of Breath - Throat felt like it was closing.   [name] [Conj Estrog-Medroxyprogest Ace] Shortness Of Breath    Throat swelling Throat swelling  . Ace Inhibitors Cough  . Simvastatin Other (See Comments) and Rash    Muscle aches    Immunization History  Administered Date(s) Administered  . Influenza Split 06/17/2011, 04/18/2012  . Influenza Whole 05/17/2007, 06/14/2010  . Influenza,inj,Quad PF,6+ Mos 05/20/2014, 06/04/2015, 03/22/2016, 05/30/2017, 04/12/2018  .  Influenza,inj,Quad PF,6-35 Mos 05/03/2013  . Pneumococcal Conjugate-13 05/20/2014  . Pneumococcal Polysaccharide-23 05/17/2007, 06/01/2013  . Td 03/01/2006  . Zoster 08/01/2009    Past Medical History:  Diagnosis Date  . Asthma   . Chronic systolic CHF (congestive heart failure) ([place])    a. cMRI 4/15: EF 34% and findings - c/w NICM, normal RV size and function (RVEF 61%), Mild MR // b. Echo 2/15:  EF 30-35%, diff HK, ant-sept AK, Gr 2 DD, mild MR, trivial TR  //  c. Echo 5/17: EF 20-25%, severe diffuse HK, marked systolic dyssynchrony, grade 1 diastolic dysfunction, mild MR  //  d. RHC 5/17: Fick CO 2.9, RVSP 19, PASP 15, PW mean 2, low filing pressures and preserved CO   . Diabetes mellitus   . Gastritis   . History of echocardiogram    Echo 6/18: EF 30-35, diffuse HK, grade 1 diastolic dysfunction, trivial MR, mild LAE, mild TR, no pericardial effusion  . History of nuclear stress test    Myoview 5/18: EF 49, no ischemia, inferoseptal defect c/w LBBB artifact (intermediate risk due to EF < 50).  [name] Kitchen HTN (hypertension)   . Hyperlipidemia   . NICM (nonischemic cardiomyopathy) ([place])    a. Nuclear 5/13: Normal stress nuclear study. LV Ejection Fraction: 58%  //  b. LHC 10/14: Minor luminal irregularity in prox LAD, EF 35%   . Parkinson disease ([place])   . Plantar fasciitis   . Sleep apnea    was retested and no longer had  it and so d/c CPAP  . Urticaria     Tobacco History: Social History   Tobacco Use  Smoking Status Never Smoker  Smokeless Tobacco Never Used  Tobacco Comment   exposed to smoke during child hood (parents)   Counseling given: Not Answered Comment: exposed to smoke during child hood (parents)   Outpatient Medications Prior to Visit  Medication Sig Dispense Refill  . albuterol (PROVENTIL HFA;VENTOLIN HFA) 108 (90 Base) MCG/ACT inhaler Inhale 2 puffs into the lungs every 4 (four) hours as needed for wheezing or shortness of breath. 18 g 3  . albuterol (PROVENTIL)  (2.5 MG/3ML) 0.083% nebulizer solution Take 3 mLs (2.5 mg total) by nebulization every 6 (six) hours as needed for wheezing or shortness of breath. 90 mL 12  . Biotin 5000 MCG CAPS Take 1 tablet by mouth daily.    . Calcium Carb-Cholecalciferol 600-800 MG-UNIT TABS Take 1 tablet by mouth daily. 90 tablet 3  . carbidopa-levodopa (SINEMET CR) 50-200 MG tablet Take 1 tablet by mouth at bedtime. 90 tablet 1  . carbidopa-levodopa (SINEMET IR) 25-100 MG tablet TAKE 1 TABLET BY MOUTH THREE TIMES DAILY 270 tablet 1  . Coenzyme Q10 (CO Q-10) 100 MG CHEW Chew 1 Dose by mouth daily.    [name] Kitchen esomeprazole (NEXIUM) 20 MG capsule Take 20 mg by mouth daily at 12 noon.     . famotidine (PEPCID) 20 MG tablet Take 20 mg by mouth 2 (two) times daily.     . furosemide (LASIX) 40 MG tablet Take 0.5 tablets (20 mg total) by mouth daily as needed for fluid (For swelling). 30 tablet 3  . gabapentin (NEURONTIN) 100 MG capsule Take 1 capsule (100 mg total) by mouth at bedtime. Take as needed. 30 capsule 0  . Lactobacillus (PROBIOTIC ACIDOPHILUS PO) Take 1 tablet by mouth daily.    [name] Kitchen losartan (COZAAR) 25 MG tablet TAKE 1 TABLET EVERY DAY 90 tablet 2  . metFORMIN (GLUCOPHAGE) 500 MG tablet Take 1,000 mg by mouth 2 (two) times daily with a meal.    . metoprolol succinate (TOPROL-XL) 50 MG 24 hr tablet Take 1 tablet (50 mg total) by mouth 2 (two) times daily. 180 tablet 3  . mometasone-formoterol (DULERA) 100-5 MCG/ACT AERO Inhale 2 puffs into the lungs 2 (two) times daily. 1 Inhaler 5  . Multiple Vitamins-Minerals (OCUVITE ADULT 50+ PO) Take 1 tablet by mouth daily.    . potassium chloride (K-DUR,KLOR-CON) 10 MEQ tablet Take 10 mEq by mouth as needed (take with lasix as needed for swelling).    [name] Kitchen spironolactone (ALDACTONE) 25 MG tablet Take 0.5 tablets (12.5 mg total) by mouth daily. 45 tablet 1  . Turmeric 500 MG TABS Take 1 tablet by mouth daily.     No facility-administered medications prior to visit.      Review of  Systems:   Constitutional:   No  weight loss, night sweats,  Fevers, chills,  +fatigue, or  lassitude.  HEENT:   No headaches,  Difficulty swallowing,  Tooth/dental problems, or  Sore throat,                No sneezing, itching, ear ache, + nasal congestion, post nasal drip,   CV:  No chest pain,  Orthopnea, PND, swelling in lower extremities, anasarca, dizziness, palpitations, syncope.   GI  No heartburn, indigestion, abdominal pain, nausea, vomiting, diarrhea, change in bowel habits, loss of appetite, bloody stools.   Resp: No chest wall deformity  Skin: no rash or  lesions.  GU: no dysuria, change in color of urine, no urgency or frequency.  No flank pain, no hematuria   MS:  No joint pain or swelling.  No decreased range of motion.  No back pain.    Physical Exam  BP (!) 116/58 (BP Location: Left Arm, Cuff Size: Normal)   Pulse 80   Temp 97.6 F (36.4 C) (Oral)   Ht 5\' 1"  (1.549 m)   Wt 135 lb 6.4 oz (61.4 kg)   LMP 08/01/2000 (Approximate)   SpO2 97%   BMI 25.58 kg/m   GEN: A/Ox3; pleasant , NAD   HEENT:  [place]/AT,  EACs-clear, TMs-wnl, NOSE-clear, THROAT-clear, no lesions, no postnasal drip or exudate noted.   NECK:  Supple w/ fair ROM; no JVD; normal carotid impulses w/o bruits; no thyromegaly or nodules palpated; no lymphadenopathy.    RESP  Few exp wheezing , increased on forced exp. Speaks in full sentences   no accessory muscle use, no dullness to percussion  CARD:  RRR, no m/r/g, no peripheral edema, pulses intact, no cyanosis or clubbing.  GI:   Soft & nt; nml bowel sounds; no organomegaly or masses detected.   Musco: Warm bil, no deformities or joint swelling noted.   Neuro: alert, no focal deficits noted.    Skin: Warm, no lesions or rashes    Lab Results:  CBC  BNP ProBNP    PFT Results Latest Ref Rng & Units 02/17/2015  FVC-Pre L 2.23  FVC-Predicted Pre % 80  FVC-Post L 2.49  FVC-Predicted Post % 89  Pre FEV1/FVC % % 75  Post FEV1/FCV  % % 79  FEV1-Pre L 1.67  FEV1-Predicted Pre % 79  FEV1-Post L 1.97  DLCO UNC% % 107  DLCO COR %Predicted % 117  TLC L 4.32  TLC % Predicted % 93  RV % Predicted % 70    Lab Results  Component Value Date   NITRICOXIDE 70 08/19/2015        Assessment & Plan:   No problem-specific Assessment & Plan notes found for this encounter.     Rexene Edison, NP 08/27/2018

## 2018-08-27 NOTE — Patient Outreach (Addendum)
[facility]) Care Management  08/27/2018  Veronica Shaw 1950/09/15 [id number]  RN Health Coach telephone call to patient.  Hipaa compliance verified. Per patient she is having  shortness of breath at rest and with activity. She has been using her nebulizer and rescue inhaler. Patient stated she might have been using  her  nebulizertoo much. She has a productive cough of a lot of white sputum. Patient stated her temperature is normal. Per patient she has been trying to call the pulmonologist but her phone calls will not go through for any outgoing calls. She is able to receive incoming calls.  Plan:  RN will call Dr office and make them aware of the above information and have them call her.   [place] Management [phone]    08/27/2018 RN called patient back and made her aware that she had notified the [place]  office. Patient asked RN to look at labs.  Pro b natriuretic peptide is elevated 310. Patient has not been weighing self. Patient has not taken fluid pill. RN reiterated the importance of weighing and making her Dr aware of weight gain of 3 pounds in 1 day or 5 pounds in a week. Pulmonologist office is calling. RN will call patient back.  [facility] Care Management [phone]

## 2018-08-27 NOTE — Telephone Encounter (Signed)
Spoke with Joaquim Lai with [facility]. States that she called the pt for a follow up visit. Pt advised her that she has been having increased DOE and coughing for the past 2 weeks. Cough is producing white mucus. Denies chest tightness, wheezing or fever. Joaquim Lai did state that the pt's last BNP from 08/15/2018 was 310.  Called and spoke with pt. She has been scheduled to come in today to see Tammy at 2:30pm. Nothing further was needed.

## 2018-08-28 ENCOUNTER — Telehealth: Payer: Self-pay | Admitting: Pulmonary Disease

## 2018-08-28 NOTE — Assessment & Plan Note (Signed)
Appears compensated without evidence of fluid overload.  Chest x-ray is pending.

## 2018-08-28 NOTE — Assessment & Plan Note (Addendum)
Exacerbation.  Exhaled nitric oxide testing is elevated consistent with an asthma exacerbation. Check chest x-ray today. Treat with antibiotics and steroids. Patient was given a Xopenex nebulizer treatment in the office. We will change her albuterol to Xopenex Increase Dulera dosing briefly.  Plan  Patient Instructions  Zpack take as directed  Mucinex DM Twice daily  As needed  Cough/congestion  Prednisone taper over next week.  Increase Dulera 200 2 puffs Twice daily  Until sample is gone , then resume Dulera 100 2 puffs Twice daily  , rinse after use.  Chest xray today .  Change Albuterol to Xopenex Neb 1 every 4-6 hrs as needed for wheezing .  Follow up with Dr. [place] Bells or [place] NP in 2 weeks and As needed   Please contact office for sooner follow up if symptoms do not improve or worsen or seek emergency care

## 2018-08-28 NOTE — Telephone Encounter (Signed)
CXR called to Patient 08/28/18-  Notes recorded by Riley Kill, CMA on 08/28/2018 at 11:00 AM EST Called and spoke with patient she is aware of results and verbalized understanding. Nothing further needed.

## 2018-08-29 ENCOUNTER — Telehealth: Payer: Self-pay | Admitting: Adult Health

## 2018-08-29 DIAGNOSIS — J4530 Mild persistent asthma, uncomplicated: Secondary | ICD-10-CM

## 2018-08-29 MED ORDER — LEVALBUTEROL HCL 0.63 MG/3ML IN NEBU: INHALATION_SOLUTION | RESPIRATORY_TRACT | 1 refills | Status: DC

## 2018-08-29 NOTE — Telephone Encounter (Signed)
Spoke with patient Patient confirmed she was directed by [facility] [address] they only needed the diagnosis code in order to process the prescription for Xopenex. She was told the medication was $167.00 without the code, but will be covered by insurance with it. Prescription sent to pharmacy Nothing further needed at this time.

## 2018-08-30 ENCOUNTER — Ambulatory Visit (INDEPENDENT_AMBULATORY_CARE_PROVIDER_SITE_OTHER): Payer: Medicare Other

## 2018-08-30 DIAGNOSIS — I428 Other cardiomyopathies: Secondary | ICD-10-CM | POA: Diagnosis not present

## 2018-08-31 ENCOUNTER — Other Ambulatory Visit: Payer: Self-pay | Admitting: Pharmacy Technician

## 2018-08-31 ENCOUNTER — Ambulatory Visit (INDEPENDENT_AMBULATORY_CARE_PROVIDER_SITE_OTHER): Payer: Medicare Other

## 2018-08-31 DIAGNOSIS — Z95810 Presence of automatic (implantable) cardiac defibrillator: Secondary | ICD-10-CM | POA: Diagnosis not present

## 2018-08-31 DIAGNOSIS — I5022 Chronic systolic (congestive) heart failure: Secondary | ICD-10-CM

## 2018-08-31 LAB — CUP PACEART REMOTE DEVICE CHECK
Battery Remaining Longevity: 62 mo
Battery Voltage: 2.98 V
Brady Statistic AP VP Percent: 0 %
Brady Statistic AP VS Percent: 0.04 %
Brady Statistic AS VP Percent: 0.03 %
Brady Statistic AS VS Percent: 99.93 %
Brady Statistic RA Percent Paced: 0.04 %
Brady Statistic RV Percent Paced: 0.03 %
Date Time Interrogation Session: 20200130062405
HighPow Impedance: 76 Ohm
Implantable Lead Implant Date: 20150427
Implantable Lead Implant Date: 20150427
Implantable Lead Location: 753859
Implantable Lead Location: 753860
Implantable Lead Model: 5076
Implantable Lead Model: 6935
Implantable Pulse Generator Implant Date: 20150427
Lead Channel Impedance Value: 399 Ohm
Lead Channel Impedance Value: 4047 Ohm
Lead Channel Impedance Value: 4047 Ohm
Lead Channel Impedance Value: 4047 Ohm
Lead Channel Impedance Value: 551 Ohm
Lead Channel Impedance Value: 646 Ohm
Lead Channel Pacing Threshold Amplitude: 0.5 V
Lead Channel Pacing Threshold Amplitude: 0.5 V
Lead Channel Pacing Threshold Pulse Width: 0.4 ms
Lead Channel Pacing Threshold Pulse Width: 0.4 ms
Lead Channel Sensing Intrinsic Amplitude: 16.625 mV
Lead Channel Sensing Intrinsic Amplitude: 16.625 mV
Lead Channel Sensing Intrinsic Amplitude: 2.375 mV
Lead Channel Sensing Intrinsic Amplitude: 2.375 mV
Lead Channel Setting Pacing Amplitude: 2 V
Lead Channel Setting Pacing Amplitude: 2.5 V
Lead Channel Setting Pacing Pulse Width: 0.4 ms
Lead Channel Setting Sensing Sensitivity: 0.3 mV

## 2018-08-31 NOTE — Patient Outreach (Signed)
[facility]) Care Management  08/31/2018  ADRINNE SZE 03/18/1951 [id number]    Incoming call received from patient in regards to [employer] patient assistance application for [facility] and Proventil HFA.  Spoke to patient, HIPAA identifiers verified. Patient was calling to inform me that she had received a letter from [employer]. Inquired if it was the attestation form. Patient stated yes. Discussed the form with the patient as well as what the patient needed to place in envelope to send back to [employer]. Patient verbalized understanding. Patient states she will place in mail today or Saturday at the latest.  Will followup with [employer] in 10-14 business days to confirm receipt of the signed attestation letter.  Chari Parmenter P. Belmira Daley, [place] Management [phone]

## 2018-08-31 NOTE — Progress Notes (Signed)
EPIC Encounter for ICM Monitoring  Patient Name: Veronica Shaw is a 67 y.o. female Date: 08/31/2018 Primary Care Physican: Abraham, Sherin, DO Primary Cardiologist:Cooper/Weaver PA Electrophysiologist: Taylor Previous Weight:130lbs  Heart failure questions reviewed.  She has been on prednisone and antibiotics and breathing has improved.   Thoracic impedance normal.   Prescribed:Furosemide40 mgTake 0.5 tablets (20 mg total) by mouth daily as needed for fluid (For swelling).   Labs: 05/24/2018 Cretainine 1.26, BUN 25, Potassium 4.1, Sodium 140, eGFR 42-49 11/30/2017 Creatinine 1.45, BUN 22, Potassium 5.3, Sodium 140, EGFR 37-43 10/19/2017 Creatinine1.16, BUN18, Potassium4.1, Sodium139, EGFR47-55 10/18/2017 Creatinine1.24, BUN17, Potassium4.4, Sodium139, EGFR44-51  10/17/2017 Creatinine1.46, BUN23, Potassium4.8, Sodium135, EGFR36-41  10/15/2017 Creatinine1.61, BUN24, Potassium4.0, Sodium133, EGFR32-37  Recommendations: No changes and encouraged to call for fluid symptoms.   Follow-up plan: ICM clinic phone appointment on3/09/2018.  Copy of ICM check sent to Dr.Taylor.   3 month ICM trend: 08/30/2018    1 Year ICM trend:       Veronica S Short, RN 08/31/2018 12:43 PM   

## 2018-09-03 ENCOUNTER — Other Ambulatory Visit: Payer: Self-pay | Admitting: Internal Medicine

## 2018-09-03 ENCOUNTER — Telehealth: Payer: Self-pay

## 2018-09-03 ENCOUNTER — Other Ambulatory Visit: Payer: Self-pay | Admitting: Family Medicine

## 2018-09-03 MED ORDER — POTASSIUM CHLORIDE CRYS ER 10 MEQ PO TBCR: 10.0000 meq | EXTENDED_RELEASE_TABLET | ORAL | 2 refills | Status: DC | PRN

## 2018-09-03 NOTE — Telephone Encounter (Signed)
Called patient to inform her that her request for Potassium Chloride will not be filled by PCP because she did not prescribe it.  Patient was offered an appointment. Patient states that she has a script that has been misplaced in the home and she will look for it because she does not want to make another appointment.  Patient also states that she needs this medication to take along with Lasix so she is not understanding why she can't get it filled from her physician if she needs it.  Ozella Almond, [place]

## 2018-09-03 NOTE — Telephone Encounter (Signed)
Please call patient and inform her refill has been sent. It appears a former resident (Dr. Sherril Cong) was previously prescribing this to her.  Dalphine Handing, PGY-2 [place] [facility] 09/03/2018 5:01 PM

## 2018-09-03 NOTE — Telephone Encounter (Signed)
Called and informed patient that a RX for Potassium Chloride has been called in to her pharmacy on record.  Ozella Almond, [place]

## 2018-09-07 ENCOUNTER — Other Ambulatory Visit: Payer: Self-pay | Admitting: Pharmacy Technician

## 2018-09-07 NOTE — Progress Notes (Signed)
Remote ICD transmission.   

## 2018-09-07 NOTE — Patient Outreach (Signed)
[facility]) Care Management  09/07/2018  BRISHA MCCABE 1951/07/07 [id number]   Care coordination call placed to [employer] patient assistance in regards to patient's application for [facility] and Proventil.  Spoke to [place] who said patient had been APPROVED as of 09/07/2018-08/01/2019. Per Irma patient should be receiving 3 inhalers of each kind. Irma said it can take up to 2-3 business day for the pharmacy to receive the request, followed by another 7-10 days for the pharmacy to process and ship the request, with another 3-5 business days for the medication to arrive at the patient's home.  Will followup with patient in 10-14 business days to confirm receipt of medication.  Gera Inboden P. Beckie Viscardi, [place] Management [phone]

## 2018-09-10 ENCOUNTER — Encounter: Payer: Self-pay | Admitting: Cardiology

## 2018-09-10 ENCOUNTER — Ambulatory Visit: Payer: Medicare Other | Admitting: Adult Health

## 2018-09-21 ENCOUNTER — Other Ambulatory Visit: Payer: Self-pay | Admitting: Pharmacy Technician

## 2018-09-21 ENCOUNTER — Ambulatory Visit: Payer: Medicare Other | Admitting: Family Medicine

## 2018-09-21 NOTE — Patient Outreach (Signed)
[facility]) Care Management  09/21/2018  Veronica Shaw May 25, 1951 [id number]   Successful outreach call placed to patient in regards to [employer] patient assistance application for Proventil and Dulera.  Spoke to patient, HIPAA identifiers verified. Informed patient that she had been approved for the patient assistance program with [employer]. Informed patient that she should be receiving the medication in the next 7-10 business days. Patient verbalized understanding.  Will followup with patient in the next 10-14 business days to inquire if she has received the medication.  Veronica Shaw P. Khaliyah Northrop, [place] Management [phone]

## 2018-09-23 ENCOUNTER — Encounter: Payer: Self-pay | Admitting: Family Medicine

## 2018-09-24 ENCOUNTER — Other Ambulatory Visit: Payer: Self-pay

## 2018-09-24 ENCOUNTER — Encounter: Payer: Self-pay | Admitting: Family Medicine

## 2018-09-24 ENCOUNTER — Ambulatory Visit (INDEPENDENT_AMBULATORY_CARE_PROVIDER_SITE_OTHER): Payer: Medicare Other | Admitting: Family Medicine

## 2018-09-24 VITALS — BP 118/62 | HR 86 | Temp 98.8°F | Ht 62.0 in | Wt 137.4 lb

## 2018-09-24 DIAGNOSIS — M5010 Cervical disc disorder with radiculopathy, unspecified cervical region: Secondary | ICD-10-CM

## 2018-09-24 DIAGNOSIS — E118 Type 2 diabetes mellitus with unspecified complications: Secondary | ICD-10-CM

## 2018-09-24 HISTORY — DX: Cervical disc disorder with radiculopathy, unspecified cervical region: M50.10

## 2018-09-24 LAB — POCT GLYCOSYLATED HEMOGLOBIN (HGB A1C): HbA1c, POC (controlled diabetic range): 7.1 % — AB (ref 0.0–7.0)

## 2018-09-24 MED ORDER — GABAPENTIN 100 MG PO CAPS: ORAL_CAPSULE | ORAL | 1 refills | Status: DC

## 2018-09-24 NOTE — Patient Instructions (Signed)
It was nice to meet you today.    I have ordered an MRI of your spine which you can get at our hospital.  I have also referred you to the neurosurgeon.  You will get a call from [place] regarding scheduling that.  I have also refilled your gabapentin.  I want you to take 300mg  at night, which is 3 pills, and 100mg  (1 pill) in the morning with breakfast.  If you start to notice side effects like drowsiness during the day you can reduce how much you take.  I would start by reducing to two pills at night and if that still doesn't fix your side effects I would stop taking the pill in the morning.     Gabapentin capsules or tablets What is this medicine? GABAPENTIN (GA ba pen tin) is used to control seizures in certain types of epilepsy. It is also used to treat certain types of nerve pain. This medicine may be used for other purposes; ask your health care provider or pharmacist if you have questions. COMMON BRAND NAME(S): Active-PAC with Gabapentin, Gabarone, Neurontin What should I tell my health care provider before I take this medicine? They need to know if you have any of these conditions: -kidney disease -suicidal thoughts, plans, or attempt; a previous suicide attempt by you or a family member -an unusual or allergic reaction to gabapentin, other medicines, foods, dyes, or preservatives -pregnant or trying to get pregnant -breast-feeding How should I use this medicine? Take this medicine by mouth with a glass of water. Follow the directions on the prescription label. You can take it with or without food. If it upsets your stomach, take it with food. Take your medicine at regular intervals. Do not take it more often than directed. Do not stop taking except on your doctor's advice. If you are directed to break the 600 or 800 mg tablets in half as part of your dose, the extra half tablet should be used for the next dose. If you have not used the extra half tablet within 28 days, it should be thrown  away. A special MedGuide will be given to you by the pharmacist with each prescription and refill. Be sure to read this information carefully each time. Talk to your pediatrician regarding the use of this medicine in children. While this drug may be prescribed for children as young as 3 years for selected conditions, precautions do apply. Overdosage: If you think you have taken too much of this medicine contact a poison control center or emergency room at once. NOTE: This medicine is only for you. Do not share this medicine with others. What if I miss a dose? If you miss a dose, take it as soon as you can. If it is almost time for your next dose, take only that dose. Do not take double or extra doses. What may interact with this medicine? Do not take this medicine with any of the following medications: -other gabapentin products This medicine may also interact with the following medications: -alcohol -antacids -antihistamines for allergy, cough and cold -certain medicines for anxiety or sleep -certain medicines for depression or psychotic disturbances -homatropine; hydrocodone -naproxen -narcotic medicines (opiates) for pain -phenothiazines like chlorpromazine, mesoridazine, prochlorperazine, thioridazine This list may not describe all possible interactions. Give your health care provider a list of all the medicines, herbs, non-prescription drugs, or dietary supplements you use. Also tell them if you smoke, drink alcohol, or use illegal drugs. Some items may interact with your medicine.  What should I watch for while using this medicine? Visit your doctor or health care professional for regular checks on your progress. You may want to keep a record at home of how you feel your condition is responding to treatment. You may want to share this information with your doctor or health care professional at each visit. You should contact your doctor or health care professional if your seizures get worse  or if you have any new types of seizures. Do not stop taking this medicine or any of your seizure medicines unless instructed by your doctor or health care professional. Stopping your medicine suddenly can increase your seizures or their severity. Wear a medical identification bracelet or chain if you are taking this medicine for seizures, and carry a card that lists all your medications. You may get drowsy, dizzy, or have blurred vision. Do not drive, use machinery, or do anything that needs mental alertness until you know how this medicine affects you. To reduce dizzy or fainting spells, do not sit or stand up quickly, especially if you are an older patient. Alcohol can increase drowsiness and dizziness. Avoid alcoholic drinks. Your mouth may get dry. Chewing sugarless gum or sucking hard candy, and drinking plenty of water will help. The use of this medicine may increase the chance of suicidal thoughts or actions. Pay special attention to how you are responding while on this medicine. Any worsening of mood, or thoughts of suicide or dying should be reported to your health care professional right away. Women who become pregnant while using this medicine may enroll in the [place] Pregnancy Registry by calling [phone]. This registry collects information about the safety of antiepileptic drug use during pregnancy. What side effects may I notice from receiving this medicine? Side effects that you should report to your doctor or health care professional as soon as possible: -allergic reactions like skin rash, itching or hives, swelling of the face, lips, or tongue -worsening of mood, thoughts or actions of suicide or dying Side effects that usually do not require medical attention (report to your doctor or health care professional if they continue or are bothersome): -constipation -difficulty walking or controlling muscle movements -dizziness -nausea -slurred  speech -tiredness -tremors -weight gain This list may not describe all possible side effects. Call your doctor for medical advice about side effects. You may report side effects to FDA at 1-800-FDA-1088. Where should I keep my medicine? Keep out of reach of children. This medicine may cause accidental overdose and death if it taken by other adults, children, or pets. Mix any unused medicine with a substance like cat litter or coffee grounds. Then throw the medicine away in a sealed container like a sealed bag or a coffee can with a lid. Do not use the medicine after the expiration date. Store at room temperature between 15 and 30 degrees C (59 and 86 degrees F). NOTE: This sheet is a summary. It may not cover all possible information. If you have questions about this medicine, talk to your doctor, pharmacist, or health care provider.  2019 Elsevier/Gold Standard (2017-12-21 13:21:44)

## 2018-09-24 NOTE — Assessment & Plan Note (Signed)
-   A1c 7.1 up from 6.1.  Had recent short course of steroids which probably contributed to elevation.  Still well controlled.  - taking metformin BID.   - foot and hand pain appears to be more from radiculopathy than diabetic peripheral neuropathy

## 2018-09-24 NOTE — Progress Notes (Signed)
[facility] Phone: [phone]   cc: right sided arm and leg pain.   Subjective:  Right side arm pain: right sided pain only. About a month she has had pain in her right arm.  Worst when she sleeps at night. It is constant.  She says she has numbness and tingling at the same time. The pain Goes from her shoulder to her hands.  She has weakness after waking up.  She has difficulty picking up her glass of water. She has been taking gabapentin as needed instead of scheduled, one pill at night two to three times a week.  She doesn't have any refills so she has been 'stretching it out'.    Neck pain - She has had this pain for several years. She got an MRI in 2013.  Someone gave her a 'shot' in her neck a few years ago and that seemed to help for several months. She was referred to a neurosurgeon but couldn't do anything about it bc she did not have insurance.   Right leg pain -  This has also been occurring for several years. She states she gets numbness and tingling as well.  She Has been using a cane for a few years. She says she Hurt her left hip as a child and has had a Limp ever since.   RA - affects her mostly in the hands and feets.  She takes tylenol as needed for pain.  She was prescribed steroids a month ago for an unrelated reason and this made the pain in her hands worse.   DM - she says the steroids she took a month ago made her blood glucose worse.  She takes her metformin    ROS: See HPI for pertinent positives and negatives  Past Medical History  Family history reviewed for today's visit. No changes.  Objective: BP 118/62   Pulse 86   Temp 98.8 F (37.1 C) (Oral)   Ht 5\' 2"  (1.575 m)   Wt 137 lb 6.4 oz (62.3 kg)   LMP 08/01/2000 (Approximate)   SpO2 97%   BMI 25.13 kg/m  Gen: NAD, alert and oriented, cooperative with exam Neck: limited range of motion.  She has almost no neck extension, limited Left/right rotation. nontender to palpation over  the spinal processes.  CV: normal rate, regular rhythm. No murmurs, no rubs.  Resp: LCTAB, no wheezes, crackles. normal work of breathing Msk: No edema, warm, normal tone, she endorsed Left hip pain with hip flexion.  Full passive ROM b/l on lower extremities.  Negative straight leg/cross leg test.  DIP swelling on right index finger.  Enlarged joints at the wrists bilaterally.  Neuro: CN II-XII grossly intact. no gross deficits.  Sensation intact in her feet and hands bilaterally.   Skin: No rashes, no lesions Psych: Appropriate behavior  Assessment/Plan: Type II diabetes mellitus with complication - C1Y 7.1 up from 6.1.  Had recent short course of steroids which probably contributed to elevation.  Still well controlled.  - taking metformin BID.   - foot and hand pain appears to be more from radiculopathy than diabetic peripheral neuropathy  Cervical disc disorder with radiculopathy of cervical region - several years of endorsing right sided sciatic pain.  Negative cross/straight legged test today.  - 1 month of worsening cervical radiculopathy affecting the right side. Resulting in numbness, weakness, paresthesia. - had neurosurgery referrals in 2013 and cervical MRI performed which showed spondylarthropathy but she could not afford surgery. -  Has been taking her gabapentin once or twice a week as she has not had a refill in a while and was trying to preserve it.  - refilled gapapentin: 300mg  at night and 100mg  in the Morning.   - neurosurgery referall - cervical MRI ordered but patient has defibrillator which may necessitate getting a CT scan instead.   - f/u with Dr. Tammi Klippel in April.     Clemetine Marker, MD PGY-1

## 2018-09-24 NOTE — Progress Notes (Signed)
21 

## 2018-09-24 NOTE — Assessment & Plan Note (Signed)
-   several years of endorsing right sided sciatic pain.  Negative cross/straight legged test today.  - 1 month of worsening cervical radiculopathy affecting the right side. Resulting in numbness, weakness, paresthesia. - had neurosurgery referrals in 2013 and cervical MRI performed which showed spondylarthropathy but she could not afford surgery. -  Has been taking her gabapentin once or twice a week as she has not had a refill in a while and was trying to preserve it.  - refilled gapapentin: 300mg  at night and 100mg  in the Morning.   - neurosurgery referall - cervical MRI ordered but patient has defibrillator which may necessitate getting a CT scan instead.   - f/u with Dr. Tammi Klippel in April.

## 2018-09-26 DIAGNOSIS — N183 Chronic kidney disease, stage 3 (moderate): Secondary | ICD-10-CM | POA: Diagnosis not present

## 2018-09-26 DIAGNOSIS — I129 Hypertensive chronic kidney disease with stage 1 through stage 4 chronic kidney disease, or unspecified chronic kidney disease: Secondary | ICD-10-CM | POA: Diagnosis not present

## 2018-09-27 ENCOUNTER — Other Ambulatory Visit: Payer: Self-pay | Admitting: *Deleted

## 2018-09-27 ENCOUNTER — Other Ambulatory Visit: Payer: Self-pay | Admitting: Pharmacy Technician

## 2018-09-27 NOTE — Patient Outreach (Addendum)
[facility]) Care Management  09/27/2018   Veronica Shaw Oct 28, 1950 [id number]  RN Health Coach telephone call to patient.  [place] compliance verified.  Per patient she is doing better. She still has some shortness of breath on exertion. No changes in the breathing is noted.  Patient stated she has been taking the Neurontin for her arm pain. She stated that the 3 pills at night makes her to drowsy when she gets up so she has cut it back to 1 at night. Rn explained to patient that she needs to make the Dr aware she is taking a different dose. Patient stated she will. Patient is weighing and taking diuretic. RN will look at closing next outreach call.    Current Medications:  Current Outpatient Medications  Medication Sig Dispense Refill  . albuterol (PROVENTIL HFA;VENTOLIN HFA) 108 (90 Base) MCG/ACT inhaler Inhale 2 puffs into the lungs every 4 (four) hours as needed for wheezing or shortness of breath. 18 g 3  . Biotin 5000 MCG CAPS Take 1 tablet by mouth daily.    . Calcium Carb-Cholecalciferol 600-800 MG-UNIT TABS Take 1 tablet by mouth daily. 90 tablet 3  . carbidopa-levodopa (SINEMET CR) 50-200 MG tablet Take 1 tablet by mouth at bedtime. 90 tablet 1  . carbidopa-levodopa (SINEMET IR) 25-100 MG tablet TAKE 1 TABLET BY MOUTH THREE TIMES DAILY 270 tablet 1  . Coenzyme Q10 (CO Q-10) 100 MG CHEW Chew 1 Dose by mouth daily.    [name] Kitchen esomeprazole (NEXIUM) 20 MG capsule Take 20 mg by mouth daily at 12 noon.     . famotidine (PEPCID) 20 MG tablet Take 20 mg by mouth 2 (two) times daily.     . furosemide (LASIX) 40 MG tablet Take 0.5 tablets (20 mg total) by mouth daily as needed for fluid (For swelling). 30 tablet 3  . gabapentin (NEURONTIN) 100 MG capsule Take 300mg  (3 pills) at night before bed and 100mg  (1 pill) in the morning with breakfast. 120 capsule 1  . Lactobacillus (PROBIOTIC ACIDOPHILUS PO) Take 1 tablet by mouth daily.    [name] Kitchen levalbuterol (XOPENEX) 0.63 MG/3ML nebulizer  solution 1 vial every 4-6 hours as needed for wheezing and shortness of breath. 72 mL 1  . losartan (COZAAR) 25 MG tablet TAKE 1 TABLET EVERY DAY 90 tablet 2  . metFORMIN (GLUCOPHAGE) 500 MG tablet Take 1,000 mg by mouth 2 (two) times daily with a meal.    . metoprolol succinate (TOPROL-XL) 50 MG 24 hr tablet Take 1 tablet (50 mg total) by mouth 2 (two) times daily. 180 tablet 3  . mometasone-formoterol (DULERA) 100-5 MCG/ACT AERO Inhale 2 puffs into the lungs 2 (two) times daily. 1 Inhaler 5  . mometasone-formoterol (DULERA) 200-5 MCG/ACT AERO Inhale 2 puffs into the lungs 2 (two) times daily. 1 Inhaler 0  . Multiple Vitamins-Minerals (OCUVITE ADULT 50+ PO) Take 1 tablet by mouth daily.    . potassium chloride (K-DUR,KLOR-CON) 10 MEQ tablet Take 1 tablet (10 mEq total) by mouth as needed (take with lasix as needed for swelling). 30 tablet 2  . predniSONE (DELTASONE) 10 MG tablet 4 tabs for 2 days, then 3 tabs for 2 days, 2 tabs for 2 days, then 1 tab for 2 days, then stop 20 tablet 0  . spironolactone (ALDACTONE) 25 MG tablet Take 0.5 tablets (12.5 mg total) by mouth daily. 45 tablet 1  . Turmeric 500 MG TABS Take 1 tablet by mouth daily.     No current  facility-administered medications for this visit.     Functional Status:  In your present state of health, do you have any difficulty performing the following activities: 09/27/2018 02/12/2018  Hearing? N N  Vision? N Y  Comment - -  Difficulty concentrating or making decisions? Y Y  Comment per patient she is sometimes forgetful -  Walking or climbing stairs? N Y  Comment difficulty due to shortness of breath and pain in rt arm and hip -  Dressing or bathing? N N  Doing errands, shopping? - Y  [place] and eating ? N N  Using the Toilet? N Y  Comment - -  In the past six months, have you accidently leaked urine? Y Y  Do you have problems with loss of bowel control? N Y  Managing your Medications? N N  Managing your  Finances? N N  Housekeeping or managing your Housekeeping? N N  Some recent data might be hidden    Fall/Depression Screening: Fall Risk  09/27/2018 09/24/2018 09/24/2018  Falls in the past year? 1 - 1  Comment - - -  Number falls in past yr: 1 - 1  Injury with Fall? 0 - 0  Comment - - -  Risk Factor Category  High Risk (2 or more Points) - -  Risk for fall due to : History of fall(s);Impaired balance/gait - History of fall(s)  Follow up - (No Data) Follow up appointment  Comment - MD informed -   PHQ 2/9 Scores 09/27/2018 09/24/2018 05/25/2018 05/03/2018 05/01/2018 04/12/2018 03/19/2018  PHQ - 2 Score 0 0 0 0 0 0 0  PHQ- 9 Score - - - - - - -  Exception Documentation - - - - - - -   THN CM Care Plan Problem One     Most Recent Value  Care Plan Problem One  Knowledge Deficit in self management of chf  Role Documenting the Problem One  [place] for Problem One  Active  THN Long Term Goal   Patient will report no readmission for CHF and following up onhealth maintenance within the next 90days  Interventions for Problem One Long Term Goal  RN reiterates moniotring weight gain and taking diuretics as perordered. RN will follow up with each outreach call  [facility] CM Short Term Goal #1   Patient will schedule health maintenance appointments within the next 30 days  THN CM Short Term Goal #1 Start Date  09/27/18  Interventions for Short Term Goal #1  Patient will follow up health maintenance visits. RN sent educational material on health maintenance visits eye, exam follow up with PCP. cardiologist and pulmonologist. RN will follow up for compliance      Assessment:  Patient understands the zones and action plan Patient is in the green zone of CHF. Breathing is no worse Patient pain in arm is better  Plan: Patient will weigh daily Rn sent educational material on heartt failure and exercise Patient will keep all medical appointments scheduled Patient will take diuretics as per  order RN reiterated medication adherence or let Dr know of changes RN sent patient educational material on health maintenance RN will look at case closure next outreach RN will follow up outreach in [place] Management [phone]

## 2018-09-27 NOTE — Patient Outreach (Signed)
[place] [facility]) Care Management  09/27/2018  Veronica Shaw 1950-10-05 [id number]    ADDENDUM  Successful outreach call placed to patient in regards to her [employer] application for [employer] and Proventil.  Spoke to patient, HIPAA identifiers verified. [name] informed me that she was just about to call me to tell me that she had received her inhalers. Inquired if she had received 3 inhalers of each she said yes. She also informed that she had received paperwork on how to obtain her refills. Discussed with patient the refill procedure informing her to call her refills into [facility] about 2 weeks before she runs out to prevent a delay in therapy. Patient informed that she had [facility] phone number in the paperwork she received as well as on the prescription box label of the inhaler. Inquired if patient had any other questions or concerns at this time in which she replied no. She expressed gratitude in our help with this matter. Confirmed patient had our name and number for future reference.  Will remove myself from care team and route a note to [place] for case closure for medication assistance.  Karee Christopherson P. Rahma Meller, [place] [facility] [phone]

## 2018-09-27 NOTE — Patient Outreach (Signed)
[facility]) Care Management  09/27/2018  Veronica Shaw 1951-01-30 [id number]   Care coordination call placed to [employer] patient assistance in regards to patient's application for [facility] and Proventil.  Spoke to [place] to inquire about the patient's shipment of medication as she was approved on 2/7.  Per Stanton Kidney she shows shipment was placed on 09/19/2018 with a 09/26/2018 delivery date to patient's home. The tracking number for USPS is [id number].  Will followup with patient to confirm that she received the medications.  Veronica Shaw, [place] Management [phone]

## 2018-09-28 ENCOUNTER — Telehealth: Payer: Self-pay | Admitting: *Deleted

## 2018-09-28 NOTE — Telephone Encounter (Signed)
Pt is calling to check status of CT ordered. Tytus Strahle, Salome Spotted, CMA

## 2018-10-02 ENCOUNTER — Ambulatory Visit (INDEPENDENT_AMBULATORY_CARE_PROVIDER_SITE_OTHER): Payer: Medicare Other

## 2018-10-02 ENCOUNTER — Telehealth: Payer: Self-pay | Admitting: Pharmacist

## 2018-10-02 ENCOUNTER — Telehealth: Payer: Self-pay

## 2018-10-02 DIAGNOSIS — Z95810 Presence of automatic (implantable) cardiac defibrillator: Secondary | ICD-10-CM

## 2018-10-02 DIAGNOSIS — I5022 Chronic systolic (congestive) heart failure: Secondary | ICD-10-CM | POA: Diagnosis not present

## 2018-10-02 NOTE — Patient Outreach (Signed)
[facility]) Care Management  10/02/2018  Veronica Shaw 13-Dec-1950 [id number]   Patient's case is being officially closed as she received her inhalers from [place] and communicated understanding on how to reorder.   Plan: Officially close patient's case. Send letters to patient and provider.   Elayne Guerin, PharmD, [place] Clinical Pharmacist [phone]

## 2018-10-02 NOTE — Telephone Encounter (Signed)
-----   Message from Jason Fila, CPhT sent at 09/27/2018 10:00 AM EST ----- fyi patient assistance case closure

## 2018-10-02 NOTE — Telephone Encounter (Signed)
Contacted pt and gave her the appointment time for her CT.  It is on March 5th @ 9:45am at [facility] 1st floor radiology. Katharina Caper, April D, [place]

## 2018-10-02 NOTE — Telephone Encounter (Signed)
Spoke with patient to remind of missed remote transmission 

## 2018-10-03 ENCOUNTER — Telehealth: Payer: Self-pay

## 2018-10-03 NOTE — Progress Notes (Signed)
EPIC Encounter for ICM Monitoring  Patient Name: Veronica Shaw is a 68 y.o. female Date: 10/03/2018 Primary Care Physican: Caroline More, DO Primary Cardiologist:Cooper/Weaver PA Electrophysiologist: Lovena Le PreviousWeight: 130lbs  Attempted call to patient and unable to reach.  Left detailed message per DPR regarding transmission. Transmission reviewed.   Thoracic impedance normal.   Prescribed:Furosemide40 mgTake 0.5 tablets (20 mg total) by mouth daily as needed for fluid (For swelling).   Labs: 08/15/2018 Creatinine 1.26, BUN 16, Potassium 4.2, Sodium 141, GFR 44-50 05/24/2018 Cretainine 1.26, BUN 25, Potassium 4.1, Sodium 140, GFR 42-49 11/30/2017 Creatinine 1.45, BUN 22, Potassium 5.3, Sodium 140, GFR 37-43 10/19/2017 Creatinine1.16, BUN18, Potassium4.1, Sodium139, GFR47-55 10/18/2017 Creatinine1.24, BUN17, Potassium4.4, Sodium139, GFR44-51  10/17/2017 Creatinine1.46, BUN23, Potassium4.8, Sodium135, [id number]  10/15/2017 Creatinine1.61, BUN24, Potassium4.0, [id number], [id number]  Recommendations: Left voice mail with ICM number and encouraged to call if experiencing any fluid symptoms..   Follow-up plan: ICM clinic phone appointment on4/12/2018.  Copy of ICM check sent to Dr.Taylor.    3 month ICM trend: 10/02/2018    1 Year ICM trend:       [name], RN 10/03/2018 2:26 PM

## 2018-10-03 NOTE — Telephone Encounter (Signed)
Remote ICM transmission received.  Attempted call to patient regarding ICM remote transmission and left detailed message, per DPR, with next ICM remote transmission date of 11/05/2018.  Advised to return call for any fluid symptoms or questions.

## 2018-10-04 ENCOUNTER — Ambulatory Visit (HOSPITAL_COMMUNITY)
Admission: RE | Admit: 2018-10-04 | Discharge: 2018-10-04 | Disposition: A | Discharge status: Home / Self Care | Payer: Medicare Other | Source: Ambulatory Visit | Attending: Family Medicine | Admitting: Family Medicine

## 2018-10-04 DIAGNOSIS — M5013 Cervical disc disorder with radiculopathy, cervicothoracic region: Secondary | ICD-10-CM | POA: Diagnosis not present

## 2018-10-04 DIAGNOSIS — M50122 Cervical disc disorder at C5-C6 level with radiculopathy: Secondary | ICD-10-CM | POA: Diagnosis not present

## 2018-10-04 DIAGNOSIS — M5010 Cervical disc disorder with radiculopathy, unspecified cervical region: Secondary | ICD-10-CM | POA: Diagnosis not present

## 2018-10-04 DIAGNOSIS — M4722 Other spondylosis with radiculopathy, cervical region: Secondary | ICD-10-CM | POA: Diagnosis not present

## 2018-10-09 ENCOUNTER — Other Ambulatory Visit: Payer: Self-pay | Admitting: Family Medicine

## 2018-10-09 DIAGNOSIS — M5010 Cervical disc disorder with radiculopathy, unspecified cervical region: Secondary | ICD-10-CM

## 2018-10-24 DIAGNOSIS — M5412 Radiculopathy, cervical region: Secondary | ICD-10-CM | POA: Insufficient documentation

## 2018-10-25 ENCOUNTER — Other Ambulatory Visit: Payer: Self-pay | Admitting: Physician Assistant

## 2018-10-25 DIAGNOSIS — M5412 Radiculopathy, cervical region: Secondary | ICD-10-CM

## 2018-10-31 ENCOUNTER — Encounter: Payer: Medicare Other | Admitting: Family Medicine

## 2018-11-05 ENCOUNTER — Ambulatory Visit (INDEPENDENT_AMBULATORY_CARE_PROVIDER_SITE_OTHER): Payer: Medicare Other

## 2018-11-05 ENCOUNTER — Other Ambulatory Visit: Payer: Self-pay

## 2018-11-05 DIAGNOSIS — I5022 Chronic systolic (congestive) heart failure: Secondary | ICD-10-CM

## 2018-11-05 DIAGNOSIS — Z95810 Presence of automatic (implantable) cardiac defibrillator: Secondary | ICD-10-CM | POA: Diagnosis not present

## 2018-11-07 ENCOUNTER — Encounter: Payer: Self-pay | Admitting: *Deleted

## 2018-11-07 NOTE — Progress Notes (Signed)
EPIC Encounter for ICM Monitoring  Patient Name: Veronica Shaw is a 68 y.o. female Date: 11/07/2018 Primary Care Physican: Caroline More, DO Primary Cardiologist:Cooper/Weaver PA Electrophysiologist: Lovena Le PreviousWeight: 135lbs  Heart failure questions reviewed and patient asymptomatic.     Thoracic impedance normal.   Prescribed:Furosemide40 mgTake 0.5 tablets (20 mg total) by mouth daily as needed for fluid (For swelling).   Labs: 08/15/2018 Creatinine 1.26, BUN 16, Potassium 4.2, Sodium 141, GFR 44-50 05/24/2018 Cretainine 1.26, BUN 25, Potassium 4.1, Sodium 140, GFR 42-49 11/30/2017 Creatinine 1.45, BUN 22, Potassium 5.3, Sodium 140, GFR 37-43 10/19/2017 Creatinine1.16, BUN18, Potassium4.1, Sodium139, GFR47-55 10/18/2017 Creatinine1.24, BUN17, Potassium4.4, Sodium139, GFR44-51  10/17/2017 Creatinine1.46, BUN23, Potassium4.8, Sodium135, [id number]  10/15/2017 Creatinine1.61, BUN24, Potassium4.0, [id number], [id number]  Recommendations: No changes and encouraged to call for fluid symptoms.   Follow-up plan: ICM clinic phone appointment on5/06/2019.  Copy of ICM check sent to Dr.Taylor.    3 month ICM trend: 11/05/2018    1 Year ICM trend:       [name], RN 11/07/2018 8:26 AM

## 2018-11-14 ENCOUNTER — Other Ambulatory Visit: Payer: Self-pay | Admitting: Family Medicine

## 2018-11-14 ENCOUNTER — Telehealth: Payer: Self-pay

## 2018-11-14 MED ORDER — FAMOTIDINE 20 MG PO TABS: 20.0000 mg | ORAL_TABLET | Freq: Two times a day (BID) | ORAL | 3 refills | Status: DC

## 2018-11-14 NOTE — Telephone Encounter (Signed)
Received fax from pharmacy requesting an alternative to be sent in for Ranitidine. Alternatives covered by insurance, Omeprazole, Pantoprazole, or Esomeprazole.

## 2018-11-14 NOTE — Telephone Encounter (Signed)
RX for famotidine in place of ranitidine   Caroline More, DO, PGY-2 [facility] 11/14/2018 12:31 PM

## 2018-11-15 ENCOUNTER — Telehealth: Payer: Self-pay | Admitting: Adult Health

## 2018-11-15 ENCOUNTER — Ambulatory Visit (INDEPENDENT_AMBULATORY_CARE_PROVIDER_SITE_OTHER): Payer: Medicare Other | Admitting: Acute Care

## 2018-11-15 ENCOUNTER — Telehealth: Payer: Self-pay | Admitting: Cardiovascular Disease

## 2018-11-15 ENCOUNTER — Other Ambulatory Visit: Payer: Self-pay

## 2018-11-15 ENCOUNTER — Encounter: Payer: Self-pay | Admitting: Acute Care

## 2018-11-15 DIAGNOSIS — J4541 Moderate persistent asthma with (acute) exacerbation: Secondary | ICD-10-CM | POA: Diagnosis not present

## 2018-11-15 DIAGNOSIS — J45901 Unspecified asthma with (acute) exacerbation: Secondary | ICD-10-CM | POA: Diagnosis not present

## 2018-11-15 DIAGNOSIS — N183 Chronic kidney disease, stage 3 (moderate): Secondary | ICD-10-CM

## 2018-11-15 DIAGNOSIS — R7989 Other specified abnormal findings of blood chemistry: Secondary | ICD-10-CM | POA: Diagnosis not present

## 2018-11-15 DIAGNOSIS — R739 Hyperglycemia, unspecified: Secondary | ICD-10-CM

## 2018-11-15 DIAGNOSIS — I447 Left bundle-branch block, unspecified: Secondary | ICD-10-CM | POA: Diagnosis not present

## 2018-11-15 DIAGNOSIS — J4530 Mild persistent asthma, uncomplicated: Secondary | ICD-10-CM

## 2018-11-15 DIAGNOSIS — R0602 Shortness of breath: Secondary | ICD-10-CM | POA: Diagnosis not present

## 2018-11-15 DIAGNOSIS — R079 Chest pain, unspecified: Secondary | ICD-10-CM

## 2018-11-15 DIAGNOSIS — R5383 Other fatigue: Secondary | ICD-10-CM | POA: Diagnosis not present

## 2018-11-15 DIAGNOSIS — Z7722 Contact with and (suspected) exposure to environmental tobacco smoke (acute) (chronic): Secondary | ICD-10-CM | POA: Diagnosis not present

## 2018-11-15 DIAGNOSIS — R000 Tachycardia, unspecified: Secondary | ICD-10-CM | POA: Diagnosis not present

## 2018-11-15 DIAGNOSIS — N179 Acute kidney failure, unspecified: Secondary | ICD-10-CM

## 2018-11-15 DIAGNOSIS — R072 Precordial pain: Secondary | ICD-10-CM | POA: Diagnosis not present

## 2018-11-15 MED ORDER — LEVALBUTEROL HCL 0.63 MG/3ML IN NEBU: INHALATION_SOLUTION | RESPIRATORY_TRACT | 2 refills | Status: DC

## 2018-11-15 NOTE — Patient Instructions (Addendum)
I am sorry you are not feeling well.  I want you to use the 200 mg Dulera 2 puffs twice daily until better.  Then resume the 100 mg in 1 week. Or when you are better.  Xopenex Neb 1 every 4-6 hrs as needed for wheezing .  We will send in a prescription for xopenex treatments as you are almost out. Please go to [place] for further evaluation. Your symptoms cannot be effectively managed by a telephone visit.  Get someone to drive her to [place], or to call 911 if you  did not feel you  Can  get there safely on her own. Please contact office for sooner follow up if symptoms do not improve or worsen or seek emergency care

## 2018-11-15 NOTE — Telephone Encounter (Signed)
I agree with you, she should be in contact with her PCP regarding productive cough not improving. Her HR is likely to be up with albuterol. Also if she is putting out more urine due to hyperglycemia this could be contributing to her low BP. Her non-cardiac issues should be first addressed. After that if she continues to have trouble with HR and BP we could arrange a visit with our office.

## 2018-11-15 NOTE — Progress Notes (Signed)
Virtual Visit via Video Note  I connected with Veronica Shaw on 11/15/18 at  4:00 PM EDT by a video enabled telemedicine application and verified that I am speaking with the correct person using two identifiers.   I discussed the limitations of evaluation and management by telemedicine and the availability of in person appointments. The patient expressed understanding and agreed to proceed.  I  confirmed date of birth and address to authenticate  Identity. My nurse Mont Dutton  reviewed medications and ordered any refills required.  I am at the office and the patient is at home.  Synopsis: 68 year old patient never smoker followed for chronic asthma and upper airway cough syndrome  by Dr. Lake Bells.    History of Present Illness: Pt. Presents to tele visit for shortness of breath x 5 days.She has had to use her neb machine more often than usual and also her rescue inhaler. Pt has had to use neb machine twice daily as well as had to use her rescue inhaler twice daily too and states she does not feel like she is getting much relief. Pt states SOB is even when she is at rest. Pt stated her BP has been low ranging 90/45 and HR has been 98 even with her defibrillator.Pt also has productive cough with clear mucus. Pt has not taken any meds to help with cough but is taking her daily meds as prescribed.She noted that her mucus is thicker than usual. Pt denies any fever and states last time she took it, temp was 97.8. Pt has had sharp pain in center of chest and this has been conveyed  to her cardiologist. She has been wheezing.She is not audibly wheezing now.  Upon connecting with the patient she endorses shortness of breath and tightness in her chest. She is using her nebs twice a day and she is using her emergency inhaler three times daily. She is using the Xopenex  nebs. She has no fever.  She is using the Dulera 100 twice daily.She is not using the Dulera 200, which we have told her to do when she  has a flare. She is not audibly wheezing on the phone and she is speaking in full sentences. Last neb treatment/ rescue inhaler was 11 am this morning.   She states her weight has increased 5 pounds but she has not taken her lasix in the last 2 days.She states she has been told to take the lasix for increase in weight of 2-3 pounds.  She states her BP was 90/45. I have had her re-check it, and it is  125/84, HR is 104. I have told her not to take any lasix because her BP has been lower than normal.   Blood sugar was 425 yesterday. She states she has been taking her Metformin as prescribed.  She states her blood sugar is 225 today.She states these are unusually high readings for her. She has not called her PCP about this.  She has stage 3 kidney failure, and states  her urine is a darker color than usual.She does not know who her kidney doctor is.She could not endorse any foul odor, or cloudiness, just that it is very dark.  I have referred patient to [place] to be evaluated. With her elevated blood sugars we cannot treat her with prednisone. I am not convinced her chest tightness is asthma. She needs to be evaluated with lab work , CXR, and 12 Lead EKG. I gave her the  address and she verbalized understanding that she should go to med center [facility] for evaluation.   Observations/Objective: Pulmonary function testing: Spirometry June 2016 ratio 62%, FEV1 0.98 L 46% predicted July 2016 ratio normal, FEV1 1.97 L 93% predicted, 17% change with bronchodilator, FVC is 2.49 L 89% predicted, total lung capacity 4.32 L 93% predicted, DLCO 21.77 107% predicted January 2017 simple spirometry ratio 71%, FEV1 1.32 L 65% predicted  Spirometry January 2017 FEV1 65%, ratio 71, FVC 72% PFT July 2016 FEV1 93%, ratio 79, FVC 89%, significant bronchodilator response, DLCO is 107%. 2D echo March 2019 EF 06-30%, grade 2 diastolic dysfunction  Exhaled nitric oxide testing: January 2017 79  ppm  Chest imaging: 2016 chest x-ray images independently reviewed showing hyperinflation and a dual-lead defibrillator device.  Records from her primary care physician's office reviewed were she was referred to [place] for management of asthma.  Assessment and Plan: Asthma Plan Evaluation at [facility] CXR to evaluate for pneumonia Increase Dulera 200 2 puffsTwice dailyfor a week  then resume Dulera 100 2 puffsTwice daily, rinse mouth after use.  Xopenex Neb 1 every 4-6 hrs as needed for wheezing .  We will send in a prescription for xopenex treatments as you are almost out.  Hyperglycemia Blood Sugars of 425 on 4/15 and 225 on 4/16 Pt states this is very unusual for her She had no change in her diet She is compliant with her Metformin No insulin on med profile Plan Please get assessed at [facility] will need lab work to evaluate needs We cannot treat with prednisone at present without tighter blood sugar control Most likely will need insulin  CHF Weigh increase , but low BP Has not taken Lasix  X 2 days Plan Do not take Lasix today  Evaluation at [facility] CXR BNP  Chest tightness/ Chest pain Plan Evaluation at [facility] 12 Lead  Troponins  Stage 3 Renal Failure States urine is much darker than usual Weight is up 5 pound in 2 days Plan Evaluation at [facility] to evaluate creatinine  Maintain renal perfusion Avoid nephrotoxic medications Monitor UO UA/ Culture  Follow Up Instructions: Follow up tele visit Monday 11/19/2018  I asked patient to get someone to drive her to [place], or to call 911 if she did not feel she could get there safely on her own. She verbalized understanding.   I discussed the assessment and treatment plan with the patient. The patient was provided an opportunity to ask questions and all were answered. The patient agreed with the plan and demonstrated an understanding of the instructions.   The patient  was advised to call back or seek an in-person evaluation if the symptoms worsen or if the condition fails to improve as anticipated.  I provided 60  minutes of non-face-to-face time during this encounter.   Magdalen Spatz, NP 11/15/2018 4:55 PM

## 2018-11-15 NOTE — Telephone Encounter (Signed)
New Message    Pt c/o Shortness Of Breath: STAT if SOB developed within the last 24 hours or pt is noticeably SOB on the phone  1. Are you currently SOB (can you hear that pt is SOB on the phone)? A little and has had it for the past few days. Has been using a nebulizer treatment for Asthma but shes not sure if her SOB is from her asthma    2. How long have you been experiencing SOB? For about a week but it just started getting worse yesterday   3. Are you SOB when sitting or when up moving around? More when she moves around   4. Are you currently experiencing any other symptoms? Pt feels light headed. Heart was beating faster yesterday. Yesterday  BP 90/45, Today 94/49 Pt has not taken BP medication this morning

## 2018-11-15 NOTE — Telephone Encounter (Signed)
Place televisit for Veronica Shaw

## 2018-11-15 NOTE — Telephone Encounter (Signed)
Spoke with patient. She has agreed to the televisit. She has been scheduled with SG at 4pm. Nothing further needed at time of call.

## 2018-11-15 NOTE — Telephone Encounter (Signed)
Pt advised Janine Hammond's message and will let us know of she does the next several days.. she had a virtual visit today with her PMD and she has made some med changes and will monitor her HR and BP.

## 2018-11-15 NOTE — Telephone Encounter (Signed)
Primary Pulmonologist: BQ Last office visit and with whom: 08/27/2018 with TP What do we see them for (pulmonary problems): asthma Last OV assessment/plan: Instructions  Return in about 2 weeks (around 09/10/2018) for Follow up Dr. Lake Bells.  Zpack take as directed  Mucinex DM Twice daily  As needed  Cough/congestion  Prednisone taper over next week.  Increase Dulera 200 2 puffs Twice daily  Until sample is gone , then resume Dulera 100 2 puffs Twice daily  , rinse after use.  Chest xray today .  Change Albuterol to Xopenex Neb 1 every 4-6 hrs as needed for wheezing .  Follow up with Dr. Lake Bells or [place] NP in 2 weeks and As needed   Please contact office for sooner follow up if symptoms do not improve or worsen or seek emergency care        Was appointment offered to patient (explain)?  Pt wants to know recs to help with symptoms   Reason for call: Called and spoke with pt. Pt stated she has had increased SOB x5 days now. She has had to use her neb machine more often than usual and also her rescue inhaler. Pt has had to use neb machine twice daily as well as had to use her rescue inhaler twice daily too and states she does not feel like she is getting much relief.  Pt states SOB is even when she is at rest. Pt stated her BP has been low ranging 90/45 and HR has been 98 even with her defibrillator.  Pt also has productive cough with clear mucus. Pt has not taken any meds to help with cough but is taking her daily meds as prescribed.  Pt denies any fever and states last time she took it, temp was 97.8.  Pt has had sharp pain in center of chest and this has been told to her cardiologist.  Pt wants to know recs to help with symptoms. Tammy, please advise on this for pt. Thanks!

## 2018-11-15 NOTE — Telephone Encounter (Addendum)
Pt called to report that she has had a worsening productive cough the last five to six days. She has noticed that her mucus is much thicker than usual. She denies a fever, chest pain, dizziness, but felt "jittery" yesterday, and her heart rate was 98. It is usually 75-80. Her BP has been 90s/40-50. However, when she was last seen in the office on 11/17/17 to see Richardson Dopp PA her pressure was 108/60. Her last several creatinine levels have been elevated and pt says she has not drank much the last few days.  Pt says since she has asthma Dr. Tammi Klippel has increased her nebulizer treatments and inhalers to BID the last few days. She is worried it is causing her fast heart rate. Today, she held her Metoprolol and her Losartan because her BP was 94/49, but she did not have any symptoms.   She is calling because she was worried about the fast heart rate. I urged her to call Dr. Tammi Klippel, and let her know that her cough is not any better, her heart rate seems to be fast with the asthma treatments, and maybe she can get a virtual visit with her. I have asked her to drink some extra fluids to see if it helps her BP improve. Pt verbalized understanding.   Pt says her blood sugar has been in the 400s, I have asked her to also alert Dr. Tammi Klippel about this.  I will forward message to Dr. Burt Knack, but he has not seen the pt since 2018. She has been followed by [facility]. Will call her if he has any recommendations or changes. Add: Pt having televisit with Anselm Lis NP now.

## 2018-11-16 DIAGNOSIS — I447 Left bundle-branch block, unspecified: Secondary | ICD-10-CM | POA: Diagnosis not present

## 2018-11-19 ENCOUNTER — Other Ambulatory Visit: Payer: Self-pay

## 2018-11-19 ENCOUNTER — Encounter: Payer: Self-pay | Admitting: Pulmonary Disease

## 2018-11-19 ENCOUNTER — Ambulatory Visit (INDEPENDENT_AMBULATORY_CARE_PROVIDER_SITE_OTHER): Payer: Medicare Other | Admitting: Pulmonary Disease

## 2018-11-19 ENCOUNTER — Telehealth: Payer: Self-pay | Admitting: Pharmacist

## 2018-11-19 ENCOUNTER — Telehealth (INDEPENDENT_AMBULATORY_CARE_PROVIDER_SITE_OTHER): Payer: Medicare Other | Admitting: Family Medicine

## 2018-11-19 DIAGNOSIS — J4541 Moderate persistent asthma with (acute) exacerbation: Secondary | ICD-10-CM | POA: Diagnosis not present

## 2018-11-19 DIAGNOSIS — H6992 Unspecified Eustachian tube disorder, left ear: Secondary | ICD-10-CM | POA: Diagnosis not present

## 2018-11-19 DIAGNOSIS — Z0000 Encounter for general adult medical examination without abnormal findings: Secondary | ICD-10-CM | POA: Insufficient documentation

## 2018-11-19 DIAGNOSIS — M0609 Rheumatoid arthritis without rheumatoid factor, multiple sites: Secondary | ICD-10-CM

## 2018-11-19 DIAGNOSIS — E118 Type 2 diabetes mellitus with unspecified complications: Secondary | ICD-10-CM

## 2018-11-19 DIAGNOSIS — J309 Allergic rhinitis, unspecified: Secondary | ICD-10-CM

## 2018-11-19 DIAGNOSIS — J4530 Mild persistent asthma, uncomplicated: Secondary | ICD-10-CM

## 2018-11-19 DIAGNOSIS — R2689 Other abnormalities of gait and mobility: Secondary | ICD-10-CM

## 2018-11-19 DIAGNOSIS — Z79899 Other long term (current) drug therapy: Secondary | ICD-10-CM

## 2018-11-19 MED ORDER — FLUTICASONE PROPIONATE 50 MCG/ACT NA SUSP: 2.0000 | Freq: Every day | NASAL | 6 refills | Status: DC

## 2018-11-19 MED ORDER — MOMETASONE FURO-FORMOTEROL FUM 200-5 MCG/ACT IN AERO: 2.0000 | INHALATION_SPRAY | Freq: Two times a day (BID) | RESPIRATORY_TRACT | 4 refills | Status: DC

## 2018-11-19 MED ORDER — MECLIZINE HCL 12.5 MG PO TABS: 6.2500 mg | ORAL_TABLET | Freq: Three times a day (TID) | ORAL | 0 refills | Status: DC | PRN

## 2018-11-19 MED ORDER — OXYMETAZOLINE HCL 0.05 % NA SOLN: 1.0000 | Freq: Two times a day (BID) | NASAL | 0 refills | Status: DC

## 2018-11-19 NOTE — Assessment & Plan Note (Signed)
Assessment: Patient reports that blood sugars were in the 400s last week Recent Solu-Medrol dosing from the ED for an exacerbation of asthma Patient reporting that fasting blood sugars today were 225  Plan: Patient needs to follow-up with primary care regarding her blood sugars Patient reports that she will contact them today Referral to community health and wellness

## 2018-11-19 NOTE — Assessment & Plan Note (Signed)
Assessment: Recent ED visit for asthma exacerbation Improved since ED visit  Plan: Start Dulera 200 69-month follow-up with Dr. Lake Bells

## 2018-11-19 NOTE — Patient Instructions (Addendum)
Restart Dulera 200 >>> 2 puffs in the morning right when you wake up, rinse out your mouth after use, 12 hours later 2 puffs, rinse after use >>> Take this daily, no matter what >>> This is not a rescue inhaler  >> THIS REPLACES YOUR DULERA 100   Only use your albuterol as a rescue medication to be used if you can't catch your breath by resting or doing a relaxed purse lip breathing pattern.  - The less you use it, the better it will work when you need it. - Ok to use up to 2 puffs  every 4 hours if you must but call for immediate appointment if use goes up over your usual need - Don't leave home without it !!  (think of it like the spare tire for your car)    Referred you back to [facility]) for management of medications / and chronic illnesses    Please start taking a daily antihistamine:  >>>choose one of: zyrtec, claritin, allegra, or xyzal  >>>these are over the counter medications  >>>can choose generic option  >>>take daily  >>>this medication helps with allergies, post nasal drip, and cough   Start Flonase / Fluticasone nasal spray - 2 sprays each nostril DAILY no matter how you feel    CALL Primary Care about blood sugars readings   Ref[facility] to [facility] - Dr. Lenna Gilford   Referred to Rheumatology    Return in about 4 months (around 03/21/2019), or if symptoms worsen or fail to improve, for Follow up with Dr. Lake Bells.     Coronavirus (COVID-19) Are you at risk?  Are you at risk for the Coronavirus (COVID-19)?  To be considered HIGH RISK for Coronavirus (COVID-19), you have to meet the following criteria:  . Traveled to [place], [place], [place], [place] or [place]; or in the [place] to [place], [place], [place], or [place]; and have fever, cough, and shortness of breath within the last 2 weeks of travel OR . Been in close contact with a person diagnosed with COVID-19 within the last 2 weeks and have fever, cough,  and shortness of breath . IF YOU DO NOT MEET THESE CRITERIA, YOU ARE CONSIDERED LOW RISK FOR COVID-19.  What to do if you are HIGH RISK for COVID-19?  [name] Kitchen If you are having a medical emergency, call 911. . Seek medical care right away. Before you go to a doctor's office, urgent care or emergency department, call ahead and tell them about your recent travel, contact with someone diagnosed with COVID-19, and your symptoms. You should receive instructions from your physician's office regarding next steps of care.  . When you arrive at healthcare provider, tell the healthcare staff immediately you have returned from visiting [place], [place], [place], [place] or [place]; or traveled in the [place] to [place], [place], [place], or [place]; in the last two weeks or you have been in close contact with a person diagnosed with COVID-19 in the last 2 weeks.   . Tell the health care staff about your symptoms: fever, cough and shortness of breath. . After you have been seen by a medical provider, you will be either: o Tested for (COVID-19) and discharged home on quarantine except to seek medical care if symptoms worsen, and asked to  - Stay home and avoid contact with others until you get your results (4-5 days)  - Avoid travel on public transportation if possible (such  as bus, train, or airplane) or o Sent to the Emergency Department by EMS for evaluation, COVID-19 testing, and possible admission depending on your condition and test results.  What to do if you are LOW RISK for COVID-19?  Reduce your risk of any infection by using the same precautions used for avoiding the common cold or flu:  [name] Kitchen Wash your hands often with soap and warm water for at least 20 seconds.  If soap and water are not readily available, use an alcohol-based hand sanitizer with at least 60% alcohol.  . If coughing or sneezing, cover your mouth and nose by coughing or sneezing into the elbow areas of your shirt or coat, into a  tissue or into your sleeve (not your hands). . Avoid shaking hands with others and consider head nods or verbal greetings only. . Avoid touching your eyes, nose, or mouth with unwashed hands.  . Avoid close contact with people who are sick. . Avoid places or events with large numbers of people in one location, like concerts or sporting events. . Carefully consider travel plans you have or are making. . If you are planning any travel outside or inside the [place], visit the CDC's Travelers' Health webpage for the latest health notices. . If you have some symptoms but not all symptoms, continue to monitor at home and seek medical attention if your symptoms worsen. . If you are having a medical emergency, call 911.   [place] / e-Visit: [email or web address]         [facility]: [facility]: [phone]                   [facility]: [phone]           It is flu season:   >>> Best ways to protect herself from the flu: Receive the yearly flu vaccine, practice good hand hygiene washing with soap and also using hand sanitizer when available, eat a nutritious meals, get adequate rest, hydrate appropriately   Please contact the office if your symptoms worsen or you have concerns that you are not improving.   Thank you for choosing [facility] for your healthcare, and for allowing [place] to partner with you on your healthcare journey. I am thankful to be able to provide care to you today.   Wyn Quaker FNP-C   Allergic Rhinitis, Adult Allergic rhinitis is a reaction to allergens in the air. Allergens are tiny specks (particles) in the air that cause your body to have an allergic reaction. This condition cannot be passed from person to person (is not contagious). Allergic rhinitis cannot be cured, but it can be controlled. There  are two types of allergic rhinitis:  Seasonal. This type is also called hay fever. It happens only during certain times of the year.  Perennial. This type can happen at any time of the year. What are the causes? This condition may be caused by:  Pollen from grasses, trees, and weeds.  House dust mites.  Pet dander.  Mold. What are the signs or symptoms? Symptoms of this condition include:  Sneezing.  Runny or stuffy nose (nasal congestion).  A lot of mucus in the back of the throat (postnasal drip).  Itchy nose.  Tearing of the eyes.  Trouble sleeping.  Being sleepy during day. How is this treated? There is no cure for this condition. You should avoid things that  trigger your symptoms (allergens). Treatment can help to relieve symptoms. This may include:  Medicines that block allergy symptoms, such as antihistamines. These may be given as a shot, nasal spray, or pill.  Shots that are given until your body becomes less sensitive to the allergen (desensitization).  Stronger medicines, if all other treatments have not worked. Follow these instructions at home: Avoiding allergens   Find out what you are allergic to. Common allergens include smoke, dust, and pollen.  Avoid them if you can. These are some of the things that you can do to avoid allergens: ? Replace carpet with wood, tile, or vinyl flooring. Carpet can trap dander and dust. ? Clean any mold found in the home. ? Do not smoke. Do not allow smoking in your home. ? Change your heating and air conditioning filter at least once a month. ? During allergy season:  Keep windows closed as much as you can. If possible, use air conditioning when there is a lot of pollen in the air.  Use a special filter for allergies with your furnace and air conditioner.  Plan outdoor activities when pollen counts are lowest. This is usually during the early morning or evening hours.  If you do go outdoors when pollen count is  high, wear a special mask for people with allergies.  When you come indoors, take a shower and change your clothes before sitting on furniture or bedding. General instructions  Do not use fans in your home.  Do not hang clothes outside to dry.  Wear sunglasses to keep pollen out of your eyes.  Wash your hands right away after you touch household pets.  Take over-the-counter and prescription medicines only as told by your doctor.  Keep all follow-up visits as told by your doctor. This is important. Contact a doctor if:  You have a fever.  You have a cough that does not go away (is persistent).  You start to make whistling sounds when you breathe (wheeze).  Your symptoms do not get better with treatment.  You have thick fluid coming from your nose.  You start to have nosebleeds. Get help right away if:  Your tongue or your lips are swollen.  You have trouble breathing.  You feel dizzy or you feel like you are going to pass out (faint).  You have cold sweats. Summary  Allergic rhinitis is a reaction to allergens in the air.  This condition may be caused by allergens. These include pollen, dust mites, pet dander, and mold.  Symptoms include a runny, itchy nose, sneezing, or tearing eyes. You may also have trouble sleeping or feel sleepy during the day.  Treatment includes taking medicines and avoiding allergens. You may also get shots or take stronger medicines.  Get help if you have a fever or a cough that does not stop. Get help right away if you are short of breath. This information is not intended to replace advice given to you by your health care provider. Make sure you discuss any questions you have with your health care provider. Document Released: 11/17/2010 Document Revised: 02/06/2018 Document Reviewed: 02/06/2018 Elsevier Interactive Patient Education  2019 [employer].

## 2018-11-19 NOTE — Assessment & Plan Note (Signed)
Referred to [facility] for management medication management, as well as help with chronic disease management

## 2018-11-19 NOTE — Patient Outreach (Signed)
[facility]) Care Management  [place]   11/19/2018  JACIA SICKMAN [birth year] [id number]  Reason for referral: medication assistance Dulera (dose change)  Referral source: [facility] Telephonic Nurse Referral medication(s): Ruthe Mannan   Current insurance: [employer] Part D  HPI: 68 year old female with multiple medical conditions including but not limited to:  Type 2 diabetes, sleep apnea, sciatic leg pain, osteopenia, Parkinson's Disease, cardiomyopathy, defibrillator placement, hyperlipidemia, GERD, hypertension, CHF, Asthma with COPD, and allergic rhinitis.  Patient visited the ED at [facility] 11/15/18 for SOB. She had a telephonic visit with Wyn Quaker, NP for follow up today.  Objective: Allergies  Allergen Reactions  . Aspirin Swelling and Other (See Comments)    Other reaction(s): Other (See Comments) Causes nose bleeds *ONLY THE COATED ASA* Causes nose bleeds *ONLY THE COATED ASA*  . Penicillins Shortness Of Breath and Rash    Shortness of Breath - Throat felt like it was closing.   [name] [Conj Estrog-Medroxyprogest Ace] Shortness Of Breath    Throat swelling Throat swelling  . Ace Inhibitors Cough  . Simvastatin Other (See Comments) and Rash    Muscle aches    Medications Reviewed Today    Reviewed by McDiarmid, [name], MD (Physician) on 11/19/18 at 1141  Med List Status: <None>  Medication Order Taking? Sig Documenting Provider Last Dose Status Informant  albuterol (PROVENTIL HFA;VENTOLIN HFA) 108 (90 Base) MCG/ACT inhaler [id number] No Inhale 2 puffs into the lungs every 4 (four) hours as needed for wheezing or shortness of breath. Rogue Bussing, MD Taking Active Self  Biotin 5000 MCG CAPS [id number] No Take 1 tablet by mouth daily. [provider] Taking Active   Calcium Carb-Cholecalciferol 600-800 MG-UNIT TABS [id number] No Take 1 tablet by mouth daily.  Patient not taking:  Reported on 11/15/2018   Rogue Bussing, MD Not  Taking Active Self           Med Note Olena Heckle, MICHELE   Wed Aug 15, 2018  9:38 AM)    carbidopa-levodopa (SINEMET CR) 50-200 MG tablet [id number] No Take 1 tablet by mouth at bedtime.  Patient not taking:  Reported on 11/15/2018   Tat, Eustace Quail, DO Not Taking Active   carbidopa-levodopa (SINEMET IR) 25-100 MG tablet [id number] No TAKE 1 TABLET BY MOUTH THREE TIMES DAILY Tat, Eustace Quail, DO Taking Active   Coenzyme Q10 (CO Q-10) 100 MG CHEW [id number] No Chew 1 Dose by mouth daily. [provider] Taking Active   esomeprazole (NEXIUM) 20 MG capsule [id number] No Take 20 mg by mouth daily at 12 noon.  [provider] Taking Active   famotidine (PEPCID) 20 MG tablet [id number] No Take 1 tablet (20 mg total) by mouth 2 (two) times daily. Caroline More, DO Taking Active   fluticasone (FLONASE) 50 MCG/ACT nasal spray [id number]  Place 2 sprays into both nostrils daily. McDiarmid, [name], MD  Active   furosemide (LASIX) 40 MG tablet [id number] No Take 0.5 tablets (20 mg total) by mouth daily as needed for fluid (For swelling). Rogue Bussing, MD Taking Active Self           Med Note Olena Heckle, MICHELE   Wed Aug 15, 2018  9:38 AM)    gabapentin (NEURONTIN) 100 MG capsule [id number] No Take 300mg  (3 pills) at night before bed and 100mg  (1 pill) in the morning with breakfast. Benay Pike, MD Taking Active   levalbuterol Penne Lash) 0.63 MG/3ML nebulizer solution [id number]  1 vial  every 4-6 hours as needed for wheezing and shortness of breath. Magdalen Spatz, NP  Active   losartan (COZAAR) 25 MG tablet [id number] No TAKE 1 TABLET EVERY DAY Sherren Mocha, MD Taking Active   meclizine (ANTIVERT) 12.5 MG tablet [id number]  Take 0.5 tablets (6.25 mg total) by mouth 3 (three) times daily as needed for dizziness. McDiarmid, [name], MD  Active   metFORMIN (GLUCOPHAGE) 500 MG tablet [id number] No Take 1,000 mg by mouth 2 (two) times daily with a meal. [provider] Taking Active    metoprolol succinate (TOPROL-XL) 50 MG 24 hr tablet [id number] No Take 1 tablet (50 mg total) by mouth 2 (two) times daily. [name], MD Taking Active   mometasone-formoterol [facility]) 100-5 MCG/ACT Hollie Salk [id number] No Inhale 2 puffs into the lungs 2 (two) times daily. Juanito Doom, MD Taking Active   mometasone-formoterol [facility]) 200-5 MCG/ACT Hollie Salk [id number]  Inhale 2 puffs into the lungs 2 (two) times daily. Lauraine Rinne, NP  Active   oxymetazoline (AFRIN) 0.05 % nasal spray [id number]  Place 1 spray into both nostrils 2 (two) times daily. McDiarmid, [name], MD  Active   potassium chloride (K-DUR,KLOR-CON) 10 MEQ tablet [id number] No Take 1 tablet (10 mEq total) by mouth as needed (take with lasix as needed for swelling). Caroline More, DO Taking Active   spironolactone (ALDACTONE) 25 MG tablet [id number] No Take 0.5 tablets (12.5 mg total) by mouth daily. Sherren Mocha, MD Taking Active   Turmeric 500 MG TABS [id number] No Take 1 tablet by mouth daily. [provider] Taking Active           Assessment:  Drugs sorted by system:  Neurologic/Psychologic: Carbidopa/Levodopa, Gabapentin,   Cardiovascular: Furosemide, Losartan, Metoprolol, Spironolactone   Pulmonary/Allergy: Proventil HFA, Fluticasone, Levalbuterol nebulizer solution,  Dulera, Prednisone  Gastrointestinal: Famotidine, Meclizine (PRN),   Endocrine: Metformin,   Vitamins/Minerals/Supplements: Biotin, Calcium Carbonate, CoEnzyme Q-10, Potassium Chloride,   Miscellaneous: Oxymetazoline  Medication Review Findings:  . Dulera was increased to 200/5 at office visit today.  Patient has been getting Dulera from [employer]. . She said Ruthe Mannan would cost her >$200 on her insurance as it is probably not formulary. . Her provider will be sent a message and asked to send in a new prescription to [facility] which is the processing Pharmacy for [facility] Patient Assistance  Program.  . Patient has diabetes and is not taking a statin.  Simvastatin is listed as causing muscle pain. It is unclear if a plant based statin was tried or three times per week dosing of rosuvastatin.  Medication Assistance Findings:  Medication assistance needs identified.    Patient is already receiving Dulera 100/5 and Proventil HFA from [facility] Patient Assistance Program.  A new prescription needs to be sent to the program's pharmacy so she can receive the new dosage at no cost.   Additional medication assistance options reviewed with patient as warranted:  No other options identified  Plan: I will send a note to the patient's provider. Follow up in 3-5 business days.   Elayne Guerin, PharmD, [place] Clinical Pharmacist [phone]

## 2018-11-19 NOTE — Progress Notes (Signed)
Virtual Visit via Telephone Note  I connected with Veronica Shaw on 11/19/18 at 11:30 AM EDT by telephone and verified that I am speaking with the correct person using two identifiers.   I discussed the limitations, risks, security and privacy concerns of performing an evaluation and management service by telephone and the availability of in person appointments. I also discussed with the patient that there may be a patient responsible charge related to this service. The patient expressed understanding and agreed to proceed.   History of Present Illness: 68 year old female never smoker followed in our office for chronic asthma and upper airway cough syndrome Past medical history: CHF, rheumatoid arthritis, Parkinson's disease, Chronic kidney disease stage III, type 2 diabetes, GERD, depression Maintenance: Dulera 100 Patient of Dr. Lake Bells  Patient consented to consult via telephone: Yes People present and their role in pt care: Pt  Chief complaint: Asthma, ED follow up   68 year old female never smoker followed in our office for chronic asthma.  She is a patient of Dr. Lake Bells.  Patient completed the tele-visit last week with SG NP was recommended after that tele-visit to proceed to the emergency room.  At that time patient was reporting she was having blood sugars greater than 400, intermittent chest pain, difficulty breathing, patient also is having recurrent falls, and did not feel well.  Patient reports that she went to [facility] not [facility] as instructed.  Per care everywhere it appears the patient had relatively normal blood work with the exception of an elevated d-dimer.  CTA was negative.  Patient was treated with Solu-Medrol IV and albuterol.  Patient was then discharged.  Patient has not followed up with primary care regarding her emergency room visit or her blood sugars.  Patient reports that she did complete a tele-visit with primary care today for her  intermittent ear pain as well as nasal drainage.  I do not see a documented note regarding this in epic.  Patient reports that her breathing is significantly better since going to the ED and has not needed to use her rescue inhaler.  Unfortunately, the patient continues to take her Dulera 100 and not the Dulera 200 that she has at home which she has been instructed to take multiple times by this office.   Patient reports that she does not want to take the [facility] 200 at this time because the fact that [facility] cost her a lot of money.  She reports that she used to work with [facility] who was able to get the patient Dulera at a much more reasonable cost.  Patient reports that she was discharged from [facility] practice 2 weeks ago but is unsure why.  Patient has not followed up with [facility] regarding this.  Patient is also unsure why the inhaler caused her so much.  It sounds based off of talking to the patient that she may have a higher deductible plan.  I suggested the patient today that she needs to call her insurance company to see if it is because she has a high deductible plan or if the Ruthe Mannan is off formulary.  She reports that she does not need to call if she is followed up with them in the past and she will not call them at this time.   Unfortunately, the patient continues to struggle with medication management as well as management of her chronic diseases.   Observations/Objective:  11/16/2018-EKG-sinus rhythm, left bundle branch block  11/15/2018- CTA-negative for PE, left  lower lobe calcified granulomas, no acute pulmonary process  11/15/2018-d-dimer 1010 11/15/2018-BNP-58  Pulmonaryfunction testing: Spirometry June 2016 ratio 62%, FEV1 0.98 L 46% predicted July 2016 ratio normal, FEV1 1.97 L 93% predicted, 17% change with bronchodilator, FVC is 2.49 L 89% predicted, total lung capacity 4.32 L 93% predicted, DLCO 21.77 107% predicted January 2017 simple spirometry ratio 71%, FEV1 1.32 L 65%  predicted  Spirometry January 2017 FEV1 65%, ratio 71, FVC 72% PFT July 2016 FEV1 93%, ratio 79, FVC 89%, significant bronchodilator response, DLCO is 107%. 2D echo March 2019 EF 00-86%, grade 2 diastolic dysfunction  Exhaled nitric oxide testing: January 2017 79 ppm  Chest imaging: 2016 chest x-ray images independently reviewed showing hyperinflation and a dual-lead defibrillator device.  Lab Results  Component Value Date   NITRICOXIDE 82 08/27/2018    Assessment and Plan:  Allergic rhinitis Assessment: Clear nasal drainage Patient reporting allergy-like symptoms  Plan: Restart Flonase 2 sprays each nostril daily no matter what Start daily antihistamine  Asthma exacerbation Assessment: Recent ED visit for asthma exacerbation Improved since ED visit  Plan: Start Dulera 200 35-month follow-up with Dr. Lake Bells  Mild persistent asthma in adult without complication Assessment: Chronic asthma Recent emergency room visit for exacerbation Patient continues to not take [facility] 200 Patient has concerns regarding cost Unsure of patient status with [facility] No Saba use since the emergency room visit  Plan: Restart Dulera 200 Saba rescue inhaler as needed Referral to [facility] for help with medication cost 29-month follow-up with Dr. Lake Bells Start daily antihistamine Restart Flonase  Type II diabetes mellitus with complication Assessment: Patient reports that blood sugars were in the 400s last week Recent Solu-Medrol dosing from the ED for an exacerbation of asthma Patient reporting that fasting blood sugars today were 225  Plan: Patient needs to follow-up with primary care regarding her blood sugars Patient reports that she will contact them today Referral to community health and wellness  Arthritis or polyarthritis, rheumatoid Assessment: Patient reports worsened arthritic pain over the past 3 to 4 months Patient has not followed up with rheumatology and many  years  Plan: Referred to rheumatology due to lack of follow-up with primary care   Balance problem Recent fall 1 week ago ?  Weakness, vertigo, eustachian tube dysfunction, elevated blood sugars  Plan: Follow-up with primary care Referral to community health and wellness Notify primary care anytime you have a fall   Medication management Referred to [facility] for management medication management, as well as help with chronic disease management  Healthcare maintenance Patient continues to struggle with medication management as well as adherence to plan of care.  Patient is exceptionally confused regarding her medications as well as who she should contact for her symptoms.  Patient has multiple chronic illnesses as well as worsening of some of those chronic illnesses over the past couple of months.  We will refer patient back to primary care.  We will also refer patient to community health and wellness to see if she would like to establish with him for primary care management so she can have 1 provider that she can routinely contact.  Patient would probably benefit from having home health to help set up medications.  Follow Up Instructions:  Return in about 4 months (around 03/21/2019), or if symptoms worsen or fail to improve, for Follow up with Dr. Lake Bells.    I discussed the assessment and treatment plan with the patient. The patient was provided an opportunity to ask questions and all were  answered. The patient agreed with the plan and demonstrated an understanding of the instructions.   The patient was advised to call back or seek an in-person evaluation if the symptoms worsen or if the condition fails to improve as anticipated.  I provided 44 minutes of non-face-to-face time during this encounter.   Lauraine Rinne, NP

## 2018-11-19 NOTE — Progress Notes (Signed)
[place] Telemedicine Visit  Patient consented to have visit conducted via telephone.  Unable to connect to perform Video Visit.  Source of information: patient and review of past ov in EMR Encounter participants: Patient: Veronica Shaw  Patient Location: Home Provider: Sherren Mocha Urania Pearlman  Others (if applicable): none  Chief Complaint: ear fullness   HPI: Ear Fullness:  History elements: Onset / Duration: several years ago Quality: pressure and fullness  Location: left ear Severity: moderate Timing: constant Course: worsening Modifying factors / Treatment prior to visit: meclizine, otic drops with topical anesthetic component.  Pt very adamant that Meclizine helps when she has worsening dizziness.  Context: Symptom of imbalance worsens with standing or turning.  Feels pulled to left.  [place] last week from the imbalance feeling.  Denies injury.  Husband at home to help should she fall and cannot get up.  Associated symptoms: Hearing decreased in both ears, but left ear worse than right Hears throbbing in left ear.  No N/V, No fever, no cough, no LOC   ROS: see hpi  Pertinent PMHx:  11/08/16 ov at [facility]: chronic ear pressure, left ear, worsening, fullness, occasional vertigo, P.E> Bilateral MEEs.  DX Bilateral MEE. Started Flonase, recommended Afrin x 3 days.  F/U 2 wkks if not improved.  May need ENT referral   03/2017: PT referral for dizziness.  PT unable to illicit nystagmus with head maneuvers though pt reported dizziness with maneuvers.   Pt treated for impaired balance with 3 sessions PT outpt neurorehab.    Exam:  Respiratory: Respiratory: speaking in full sentence, no audible wheeze, voice prosodic   Assessment/Plan:  Ear Pressure/fullness - Possible recurrence of Middle Ear Effusion documented in ov 04/208 at [facility].  - Empiric trial Flonase NS, 3 days oxymetazoline NS, and will use very low dose of meclizine 6.25 mg TID prn (discussed risks  of confusion, falls with meclizine).  - Pt to let [facility] know if she cannot tolerate these medications.  She is to let us know if she has noticed any improvement with this empiric therapy.  If not, she should come into office for physical assessment and possible ENT referral.     Time spent on phone with patient: 11 minutes

## 2018-11-19 NOTE — Patient Outreach (Signed)
[name]) Care Management  11/19/2018  [name] 1950-08-18 [id number]   Referral Date: 11/19/2018 Referral Source: MD referral Referral Reason: Cost of inhaler   Outreach Attempt: Telephone call to patient.  She is able to verify HIPAA.  She states that her Veronica Shaw was increased to 200 mg by the physician but she has only the 100mg .  Patient states that she cannot afford the 200 mg on her own.  She states that [facility] had stopped calling her.  Advised patient that after reviewing the record that the pharmacist has closed her case due to completing the drug assistance portion of the program.  Patient states she also had the misunderstanding that once the pharmacist finished she would no longer get the medication.  Advised patient that she would get her medication from the drug company [employer] as part of the patient assistance program and that [facility] pharmacist explained that to her.  She again states she misunderstood.  Advised patient that CM would get her back in touch with the pharmacist for further assistance and to address the increase in [facility]. Patient mentioned her health coach Johny Shock, RN as well as she has been so helpful. She states she had not talked with her in about one month.  Advised her that Joaquim Lai was due to call her again in May but CM will have her outreach her for follow up.  She verbalized understanding and is appreciative of the assistance.     Plan: RN CM will refer to pharmacy for follow up medication management. RN CM will sign off case.     [name], RN, MSN [facility] Care Management Care Management Coordinator Direct Line [phone] Toll Free: [phone]  Fax: [phone]

## 2018-11-19 NOTE — Assessment & Plan Note (Signed)
Assessment: Patient reports worsened arthritic pain over the past 3 to 4 months Patient has not followed up with rheumatology and many years  Plan: Referred to rheumatology due to lack of follow-up with primary care

## 2018-11-19 NOTE — Assessment & Plan Note (Signed)
Patient continues to struggle with medication management as well as adherence to plan of care.  Patient is exceptionally confused regarding her medications as well as who she should contact for her symptoms.  Patient has multiple chronic illnesses as well as worsening of some of those chronic illnesses over the past couple of months.  We will refer patient back to primary care.  We will also refer patient to community health and wellness to see if she would like to establish with him for primary care management so she can have 1 provider that she can routinely contact.  Patient would probably benefit from having home health to help set up medications.

## 2018-11-19 NOTE — Assessment & Plan Note (Signed)
Assessment: Clear nasal drainage Patient reporting allergy-like symptoms  Plan: Restart Flonase 2 sprays each nostril daily no matter what Start daily antihistamine

## 2018-11-19 NOTE — Assessment & Plan Note (Signed)
Assessment: Chronic asthma Recent emergency room visit for exacerbation Patient continues to not take Dulera 200 Patient has concerns regarding cost Unsure of patient status with [facility] No Saba use since the emergency room visit  Plan: Restart Dulera 200 Saba rescue inhaler as needed Referral to [facility] for help with medication cost 10-month follow-up with Dr. Lake Bells Start daily antihistamine Restart Flonase

## 2018-11-19 NOTE — Assessment & Plan Note (Signed)
Recent fall 1 week ago ?  Weakness, vertigo, eustachian tube dysfunction, elevated blood sugars  Plan: Follow-up with primary care Referral to community health and wellness Notify primary care anytime you have a fall

## 2018-11-20 ENCOUNTER — Telehealth: Payer: Self-pay | Admitting: Pulmonary Disease

## 2018-11-20 ENCOUNTER — Encounter: Payer: Self-pay | Admitting: Pharmacist

## 2018-11-20 ENCOUNTER — Other Ambulatory Visit: Payer: Self-pay | Admitting: Internal Medicine

## 2018-11-20 MED ORDER — METOPROLOL SUCCINATE ER 50 MG PO TB24: 50.0000 mg | ORAL_TABLET | Freq: Two times a day (BID) | ORAL | 2 refills | Status: DC

## 2018-11-20 MED ORDER — MOMETASONE FURO-FORMOTEROL FUM 200-5 MCG/ACT IN AERO: 2.0000 | INHALATION_SPRAY | Freq: Two times a day (BID) | RESPIRATORY_TRACT | 3 refills | Status: DC

## 2018-11-20 NOTE — Telephone Encounter (Signed)
Pt's medication was sent to pt's pharmacy as requested. Confirmation received.  °

## 2018-11-20 NOTE — Telephone Encounter (Signed)
11/20/2018 9629  Please see the message below from [facility] pharmacist:  Provider Warner Mccreedy,   Please see the attached [facility] Pharmacist's note. Patient was referred for [place] due to the cost of [facility]. Patient was previously approved through [facility] Patient Assistance Program to receive Proventil HFA and Dulera 100/5 at no cost.    If deemed therapeutically appropriate, please send a new prescription for a 90 day supply of Dulera 200/5 to [facility] in [place], [place]. [facility] is the processing pharmacy for [facility] Patient Assistance program. [facility] was added to the patient's clinical demographics.    Ruthe Mannan is most likely non-formulary on her insurance but was being obtained through patient assistance programming which does not process through insurance.   Thank you so much for your time, consideration, and for taking such great care of our patients.   Blessings,   Elayne Guerin, PharmD, BCACP  [facility] Clinical Pharmacist  [phone]    Lauren, weekly please place a prescription of Dulera 200 to [facility] pharmacy in [place] as instructed by tried Research scientist (medical) above.  We can go ahead and send a 90-day supply.  3 refills.   Please also contact the patient to update then that we are sending this medication to the new pharmacy so she can receive it for a lower price.   Sending to [facility] as Juluis Rainier.   Wyn Quaker FNP

## 2018-11-20 NOTE — Telephone Encounter (Signed)
Contacted patient by phone regarding Dulera 200/5 prescription.  Notified patient that the [facility] order has been placed today as instructed by Wyn Quaker, NP per [facility] pharmacist's recommendation to [facility] in [place], [place].  Patient will have inhalers mailed to her from this supplier.  Patient states Alwyn Ren from [facility] has already called and explained this change to the patient and she had no further questions and she appreciates everyone's help. Nothing further needed today.

## 2018-11-21 ENCOUNTER — Telehealth: Payer: Self-pay | Admitting: Pulmonary Disease

## 2018-11-21 NOTE — Telephone Encounter (Signed)
Instructions from televisit that pt had with SG 4/16 I am sorry you are not feeling well.  I want you to use the 200 mg Dulera 2 puffs twice daily until better.  Then resume the 100 mg in 1 week. Or when you are better.  Xopenex Neb 1 every 4-6 hrs as needed for wheezing .  We will send in a prescription for xopenex treatments as you are almost out. Please go to [place] for further evaluation. Your symptoms cannot be effectively managed by a telephone visit.  Get someone to drive her to [place], or to call 911 if you  did not feel you  Can  get there safely on her own. Please contact office for sooner follow up if symptoms do not improve or worsen or seek emergency care       Instructions from televisit that pt had with BM 4/20 Return in about 4 months (around 03/21/2019), or if symptoms worsen or fail to improve, for Follow up with Dr. Lake Bells.  Restart Dulera 200 >>> 2 puffs in the morning right when you wake up, rinse out your mouth after use, 12 hours later 2 puffs, rinse after use >>> Take this daily, no matter what >>> This is not a rescue inhaler  >> THIS REPLACES YOUR DULERA 100   Only use your albuterol as a rescue medication to be used if you can't catch your breath by resting or doing a relaxed purse lip breathing pattern.  - The less you use it, the better it will work when you need it. - Ok to use up to 2 puffs every 4 hours if you must but call for immediate appointment if use goes up over your usual need - Don't leave home without it !! (think of it like the spare tire for your car)   Referred you back to [facility]) for management of medications / and chronic illnesses   Please start taking a daily antihistamine:  >>>choose one of: zyrtec, claritin, allegra, or xyzal  >>>these are over the counter medications  >>>can choose generic option  >>>take daily  >>>this medication helps with allergies, post nasal drip, and  cough   Start Flonase / Fluticasone nasal spray - 2 sprays each nostril DAILY no matter how you feel   CALL Primary Care about blood sugars readings   Referred to [facility] and [facility] - Dr. Lenna Gilford   Referred to Rheumatology   Return in about 4 months (around 03/21/2019), or if symptoms worsen or fail to improve, for Follow up with Dr. Lake Bells.     Pt called needing clarification to see where she actually needs to go to be monitored as she has received information from two different providers in regards to where she has been referred to for monitoring of her blood sugars. Judson Roch, can you please help [place] and pt out with this to where she does need to go to have her blood sugars monitored based on the info stated from your televisit with pt and also the info stated by Aaron Edelman in his televisit. Thanks!

## 2018-11-22 NOTE — Telephone Encounter (Signed)
PCP needs to manage blood sugars

## 2018-11-22 NOTE — Telephone Encounter (Signed)
Called and spoke with pt letting her know that SG said for her to contact PCP in regards to monitoring of blood sugars. Pt expressed understanding. Nothing further needed.

## 2018-11-23 ENCOUNTER — Other Ambulatory Visit: Payer: Self-pay | Admitting: Pharmacy Technician

## 2018-11-23 ENCOUNTER — Ambulatory Visit: Payer: Self-pay | Admitting: Pharmacist

## 2018-11-23 ENCOUNTER — Telehealth: Payer: Self-pay | Admitting: Pulmonary Disease

## 2018-11-23 ENCOUNTER — Other Ambulatory Visit: Payer: Self-pay | Admitting: Pharmacist

## 2018-11-23 ENCOUNTER — Other Ambulatory Visit: Payer: Self-pay | Admitting: Pulmonary Disease

## 2018-11-23 NOTE — Patient Outreach (Signed)
[place] [facility]) Care Management  11/23/2018  BEN SANZ 1950-12-27 [id number]   Care coordination call placed to [employer] patient assistance in regards to patient's [facility] prescription.  Spoke to Irma to inquire if they had received the new rx for patient's [facility] which showed the increase in dose to 240mcg. Per Irma she informed that they had received the prescription tne the shipment was in transit to the patient. She informed the medicaiton should arrive via [place] on 11/28/2018. The tracking number is  [id number].  Will route note to [place].  Adea Geisel P. Trexton Escamilla, [place] Management [phone]

## 2018-11-23 NOTE — Telephone Encounter (Signed)
A message was left on pt's machine 4/22 after she had contacted our office with confusion in regards to where she needed to go to have her blood sugars monitored but pt was spoken to yesterday, 4/23 in regards to this.  Other than the message that was originally left on pt's machine 4/22, I do not see where pt was contacted by anyone else other than that one time and with that, everything was taken care of.  Attempted to call pt to see if anything else was needed other than the conversation that I had with her yesterday, 4/23 but unable to reach. Left message for pt to return call.

## 2018-11-23 NOTE — Patient Outreach (Signed)
[facility]) Care Management  11/23/2018  Veronica Shaw [birth year] [id number]   Patient was called to follow up on medication assistance. Jill Simcox, CPhT confirmed the patient will receive Dulera 200/5 on 11/28/2018 from [employer]. HIPAA identifiers were obtained. Patient was very Patent attorney.  Patient reported feeling better today and that she was not feeling short of breath.  Plan: Follow up in 10-14 business days to be sure she received her shipment.   Elayne Guerin, PharmD, [place] Clinical Pharmacist [phone]

## 2018-11-23 NOTE — Telephone Encounter (Signed)
ATC patient, was able to reach. Left message she would be receiving medication on 4/29 if any questions or issues to give our office a call.   Nothing further needed at this time.

## 2018-11-23 NOTE — Telephone Encounter (Signed)
11/23/2018 1024  Lauren please contact the patient and let her know that she should be receiving Dulera 200 from her patient's assistance program.  Please see message below from [facility]:  Good morning! We received word from [facility] Patient Assistance program that Veronica Shaw should be receiving the [facility] 200 11/28/2018 via mail.    Coordinated with the patient's telephonic nurse, Johny Shock, who will be reaching out to the patient as well.    Blessings,   Elayne Guerin, PharmD, [place] Clinical Pharmacist  [phone]   Wyn Quaker, FNP

## 2018-11-26 NOTE — Telephone Encounter (Signed)
Returned call to patient she states she received a letter from [facility] and all is resolved. Nothing further needed.

## 2018-11-29 ENCOUNTER — Ambulatory Visit (INDEPENDENT_AMBULATORY_CARE_PROVIDER_SITE_OTHER): Payer: Medicare Other | Admitting: *Deleted

## 2018-11-29 ENCOUNTER — Other Ambulatory Visit: Payer: Self-pay

## 2018-11-29 DIAGNOSIS — I428 Other cardiomyopathies: Secondary | ICD-10-CM | POA: Diagnosis not present

## 2018-11-30 ENCOUNTER — Other Ambulatory Visit: Payer: Self-pay | Admitting: Cardiovascular Disease

## 2018-11-30 LAB — CUP PACEART REMOTE DEVICE CHECK
Battery Remaining Longevity: 56 mo
Battery Voltage: 2.97 V
Brady Statistic AP VP Percent: 0 %
Brady Statistic AP VS Percent: 0.04 %
Brady Statistic AS VP Percent: 0.03 %
Brady Statistic AS VS Percent: 99.93 %
Brady Statistic RA Percent Paced: 0.04 %
Brady Statistic RV Percent Paced: 0.03 %
Date Time Interrogation Session: 20200501002826
HighPow Impedance: 78 Ohm
Implantable Lead Implant Date: 20150427
Implantable Lead Implant Date: 20150427
Implantable Lead Location: 753859
Implantable Lead Location: 753860
Implantable Lead Model: 5076
Implantable Lead Model: 6935
Implantable Pulse Generator Implant Date: 20150427
Lead Channel Impedance Value: 399 Ohm
Lead Channel Impedance Value: 4047 Ohm
Lead Channel Impedance Value: 4047 Ohm
Lead Channel Impedance Value: 4047 Ohm
Lead Channel Impedance Value: 589 Ohm
Lead Channel Impedance Value: 665 Ohm
Lead Channel Pacing Threshold Amplitude: 0.5 V
Lead Channel Pacing Threshold Amplitude: 0.625 V
Lead Channel Pacing Threshold Pulse Width: 0.4 ms
Lead Channel Pacing Threshold Pulse Width: 0.4 ms
Lead Channel Sensing Intrinsic Amplitude: 1.875 mV
Lead Channel Sensing Intrinsic Amplitude: 1.875 mV
Lead Channel Sensing Intrinsic Amplitude: 12.25 mV
Lead Channel Sensing Intrinsic Amplitude: 12.25 mV
Lead Channel Setting Pacing Amplitude: 2 V
Lead Channel Setting Pacing Amplitude: 2.5 V
Lead Channel Setting Pacing Pulse Width: 0.4 ms
Lead Channel Setting Sensing Sensitivity: 0.3 mV

## 2018-12-04 ENCOUNTER — Other Ambulatory Visit: Payer: Self-pay | Admitting: Pharmacist

## 2018-12-04 NOTE — Patient Outreach (Signed)
[facility]) Care Management  12/04/2018  KEAUNA BRASEL 1951/07/31 [id number]   Called patient to follow up with her. HIPAA identifiers were obtained. Patient reported that she was feeling fine and had been practicing "social distancing".  She also confirmed she received Dulera 200/5 from the [employer] Patient Assistance Program over the weekend. Patient said she is able to operate the inhaler and understands her regimen. She also communicated understanding about how to reorder from [employer].  Plan: Reach back out to the patient in 2 months to be sure she understands the reorder process as a way to decrease ER visits.   Elayne Guerin, PharmD, [place] Clinical Pharmacist [phone]

## 2018-12-06 ENCOUNTER — Ambulatory Visit: Payer: Self-pay | Admitting: Pharmacist

## 2018-12-06 ENCOUNTER — Other Ambulatory Visit: Payer: Self-pay

## 2018-12-06 ENCOUNTER — Encounter: Payer: Self-pay | Admitting: Family Medicine

## 2018-12-06 ENCOUNTER — Telehealth (INDEPENDENT_AMBULATORY_CARE_PROVIDER_SITE_OTHER): Payer: Medicare Other | Admitting: Family Medicine

## 2018-12-06 VITALS — BP 110/62 | Wt 132.0 lb

## 2018-12-06 DIAGNOSIS — B9789 Other viral agents as the cause of diseases classified elsewhere: Secondary | ICD-10-CM | POA: Diagnosis not present

## 2018-12-06 DIAGNOSIS — J069 Acute upper respiratory infection, unspecified: Secondary | ICD-10-CM

## 2018-12-06 MED ORDER — BENZONATATE 100 MG PO CAPS: 100.0000 mg | ORAL_CAPSULE | Freq: Two times a day (BID) | ORAL | 1 refills | Status: DC | PRN

## 2018-12-06 NOTE — Progress Notes (Signed)
[facility] Telemedicine Visit  Patient consented to have virtual visit. Method of visit: Telephone  Encounter participants: Patient: ANIYLA HARLING - located at home  Provider: Benay Pike - located at [facility] Others (if applicable): none  Chief Complaint: cough  HPI:  Cough, had it last year at the same time, as did her husband.   They had the flu.  The cough this year started two weeks ago. Her  Husband is coughing as well. Patient's cough is productive of clear sputum. No headache.  No watery eyes.  No fever.No n/v/d,  Patient has had a Runny nose for one year for which she Has been using flonase.  The patient takes medication for dizziness. She Has asthma for which she Takes a daily inhaler.  Always tired. No one in the family has been sick and the patient doesn't 'go out'. Grandchildren and children bring them out.     ROS: per HPI  Pertinent PMHx: syncope, asthma, allergic rhinitis  Exam:  Respiratory: patient speaking in full sentences without difficulty. No cough.    Assessment/Plan:  Viral URI with cough Patient and her husband both developed a cough at approximately the same time two weeks ago.  No other symptoms.  Advised patient she would need to wait for virus to run its course.  Patient requested tesselon for cough. - tessalon perles BID PRN    Time spent during visit with patient: 8 minutes

## 2018-12-07 ENCOUNTER — Encounter: Payer: Self-pay | Admitting: Cardiology

## 2018-12-07 ENCOUNTER — Telehealth: Payer: Self-pay | Admitting: Pulmonary Disease

## 2018-12-07 NOTE — Progress Notes (Signed)
Remote ICD transmission.   

## 2018-12-07 NOTE — Telephone Encounter (Signed)
12/07/2018 2049  Lauren, Please contact the patient to let her know that in order for [place] to successfully refer her to rheumatology we need her old rheumatology records.  The new rheumatology office is requesting that they have her old rheumatology records sent to them.   If she cannot obtain these records then we will have to close the referral and she will have to follow-up with primary care.  Wyn Quaker, FNP

## 2018-12-08 DIAGNOSIS — J069 Acute upper respiratory infection, unspecified: Secondary | ICD-10-CM | POA: Insufficient documentation

## 2018-12-08 NOTE — Assessment & Plan Note (Signed)
Patient and her husband both developed a cough at approximately the same time two weeks ago.  No other symptoms.  Advised patient she would need to wait for virus to run its course.  Patient requested tesselon for cough. - tessalon perles BID PRN

## 2018-12-10 ENCOUNTER — Ambulatory Visit (INDEPENDENT_AMBULATORY_CARE_PROVIDER_SITE_OTHER): Payer: Medicare Other

## 2018-12-10 ENCOUNTER — Other Ambulatory Visit: Payer: Self-pay

## 2018-12-10 DIAGNOSIS — Z95810 Presence of automatic (implantable) cardiac defibrillator: Secondary | ICD-10-CM

## 2018-12-10 DIAGNOSIS — I5022 Chronic systolic (congestive) heart failure: Secondary | ICD-10-CM | POA: Diagnosis not present

## 2018-12-10 NOTE — Progress Notes (Signed)
Agree with current plan. Let's see how she does first before we increase Lasix to every other day. Thanks, Richardson Dopp, PA-C    12/10/2018 5:43 PM

## 2018-12-10 NOTE — Progress Notes (Signed)
EPIC Encounter for ICM Monitoring  Patient Name: Veronica Shaw is a 68 y.o. female Date: 12/10/2018 Primary Care Physican: Caroline More, DO Primary Cardiologist:Cooper/Weaver PA Electrophysiologist: Lovena Le LastWeight: 135lbs 12/10/2018 Weight: 136 lbs  Heart failure questions reviewed.  She was taking PRN lasix daily and about a week ago she returned to PRN dosage.  She gained 2 lbs when she stopped taking Lasix daily.  Her heart has been racing and she is having heart palpitations that comes and goes. She had ER visit 4/16 due to productive cough, shortness of breath and blood glucose was over 400.  She has been prescribed inhalers at ER and her pulmonologist.   Daune Perch, Cardiology NP addressed the heart racine and in the 4/16 epic note and this could be related to the nebulizer and inhalers.    OptiVol Thoracic impedance abnormal suggesting fluid accumulation since 5/4.   Prescribed:Furosemide40 mgTake 0.5 tablets (20 mg total) by mouth daily as needed for fluid (For swelling).   Labs: 08/15/2018 Creatinine 1.26, BUN 16, Potassium 4.2, Sodium 141, GFR 44-50 05/24/2018 Cretainine 1.26, BUN 25, Potassium 4.1, Sodium 140, GFR 42-49 11/30/2017 Creatinine 1.45, BUN 22, Potassium 5.3, Sodium 140, GFR 37-43 10/19/2017 Creatinine1.16, BUN18, Potassium4.1, Sodium139, GFR47-55 10/18/2017 Creatinine1.24, BUN17, Potassium4.4, Sodium139, GFR44-51  10/17/2017 Creatinine1.46, BUN23, Potassium4.8, Sodium135, NUU72-53  10/15/2017 Creatinine1.61, BUN24, Potassium4.0, [id number], [id number]  Recommendations: Patient will take PRN Lasix x 2 days.   Patient is asking if she should be taking Lasix every other day instead of PRN to help prevent fluid accumulation.   Advised will ask for recommendation if Lasix should be taking on regular basis instead of PRN.   Follow-up plan: ICM clinic phone appointment on5/21/2020 to recheck  fluid levels.Her last visit with [name] was 2018 and last visit with Richardson Dopp PA was 08/2017.  Will have Dr Tanna Furry scheduler call to set up an appt.    Copy of ICM check sent to Dr.Taylor, Dr Burt Knack and Richardson Dopp, PA.   3 month ICM trend: 12/10/2018    1 Year ICM trend:       [name], RN 12/10/2018 12:03 PM

## 2018-12-10 NOTE — Telephone Encounter (Signed)
Called and spoke with patient. She states that it was 18 years ago and she doesn't know if she can obtain them. She said she will try to call them and obtain records. I told her if she is unable to let us know so we can cancel the referral and she needs to call her PCP.  Nothing further needed here will keep in my box for follow up

## 2018-12-12 NOTE — Progress Notes (Signed)
Call to patient and advised to continue with plan to take PRN Lasix x 2 days and then return to PRN as recommended by Richardson Dopp, PA.  Will recheck fluid levels 12/20/2018.

## 2018-12-18 ENCOUNTER — Telehealth: Payer: Self-pay | Admitting: Nurse Practitioner

## 2018-12-18 NOTE — Telephone Encounter (Signed)
Phone call to patient to verify medication list and allergies for myelogram procedure. Pt aware she will not need to hold any medications for this procedure. Reviewed pre and post procedure instructions.

## 2018-12-19 ENCOUNTER — Other Ambulatory Visit: Payer: Self-pay | Admitting: Family Medicine

## 2018-12-20 ENCOUNTER — Other Ambulatory Visit: Payer: Self-pay

## 2018-12-20 ENCOUNTER — Ambulatory Visit (INDEPENDENT_AMBULATORY_CARE_PROVIDER_SITE_OTHER): Payer: Medicare Other

## 2018-12-20 DIAGNOSIS — Z95810 Presence of automatic (implantable) cardiac defibrillator: Secondary | ICD-10-CM

## 2018-12-20 DIAGNOSIS — I5022 Chronic systolic (congestive) heart failure: Secondary | ICD-10-CM

## 2018-12-21 ENCOUNTER — Telehealth: Payer: Self-pay

## 2018-12-21 NOTE — Telephone Encounter (Signed)
Remote ICM transmission received.  Attempted call to patient regarding ICM remote transmission and no answer.  

## 2018-12-21 NOTE — Progress Notes (Signed)
EPIC Encounter for ICM Monitoring  Patient Name: Veronica Shaw is a 68 y.o. female Date: 12/21/2018 Primary Care Physican: Caroline More, DO Primary Cardiologist:Cooper/Weaver PA Electrophysiologist: Lovena Le LastWeight: 135lbs 12/10/2018 Weight: 136 lbs  Attempted call to patient and unable to reach.  Transmission reviewed.       OptiVol Thoracic impedance returned to normal since 12/10/2018 transmisison after recommendation give to take PRN Furosemide.   Prescribed:Furosemide40 mgTake 0.5 tablets (20 mg total) by mouth daily as needed for fluid (For swelling).   Labs: 08/15/2018 Creatinine 1.26, BUN 16, Potassium 4.2, Sodium 141, GFR 44-50 05/24/2018 Cretainine 1.26, BUN 25, Potassium 4.1, Sodium 140, GFR 42-49 11/30/2017 Creatinine 1.45, BUN 22, Potassium 5.3, Sodium 140, GFR 37-43 10/19/2017 Creatinine1.16, BUN18, Potassium4.1, Sodium139, GFR47-55 10/18/2017 Creatinine1.24, BUN17, Potassium4.4, [id number], GFR44-51  10/17/2017 Creatinine1.46, BUN23, Potassium4.8, Sodium135, FYT24-46  10/15/2017 Creatinine1.61, BUN24, Potassium4.0, [id number], [id number]  Recommendations: Unable to reach.  Follow-up plan: ICM clinic phone appointment on6/15/2020.Office visit scheduled with [name] 12/27/2018.      Copy of ICM check sent to Dr.Taylor and Richardson Dopp, PA.  3 month ICM trend: 12/20/2018    1 Year ICM trend:       [name], RN 12/21/2018 2:51 PM

## 2018-12-24 NOTE — Progress Notes (Signed)
Agree. Richardson Dopp, PA-C    12/24/2018 12:13 PM

## 2018-12-25 ENCOUNTER — Telehealth: Payer: Self-pay | Admitting: Internal Medicine

## 2018-12-25 NOTE — Telephone Encounter (Signed)
New message    Pt appt on 05.28.20 was changed to a virtual appt via phone call with Dr. Lovena Le. Pt phone number is listed in appt notes. Pt said she has a smart phone but sometimes had trouble with it so she preferred phone call.      Virtual Visit Pre-Appointment Phone Call  "(Name), I am calling you today to discuss your upcoming appointment. We are currently trying to limit exposure to the virus that causes COVID-19 by seeing patients at home rather than in the office."  1. "What is the BEST phone number to call the day of the visit?" - include this in appointment notes  2. Do you have or have access to (through a family member/friend) a smartphone with video capability that we can use for your visit?" a. If yes - list this number in appt notes as cell (if different from BEST phone #) and list the appointment type as a VIDEO visit in appointment notes b. If no - list the appointment type as a PHONE visit in appointment notes  3. Confirm consent - "In the setting of the current Covid19 crisis, you are scheduled for a (phone or video) visit with your provider on (date) at (time).  Just as we do with many in-office visits, in order for you to participate in this visit, we must obtain consent.  If you'd like, I can send this to your mychart (if signed up) or email for you to review.  Otherwise, I can obtain your verbal consent now.  All virtual visits are billed to your insurance company just like a normal visit would be.  By agreeing to a virtual visit, we'd like you to understand that the technology does not allow for your provider to perform an examination, and thus may limit your provider's ability to fully assess your condition. If your provider identifies any concerns that need to be evaluated in person, we will make arrangements to do so.  Finally, though the technology is pretty good, we cannot assure that it will always work on either your or our end, and in the setting of a video  visit, we may have to convert it to a phone-only visit.  In either situation, we cannot ensure that we have a secure connection.  Are you willing to proceed?" STAFF: Did the patient verbally acknowledge consent to telehealth visit? Document YES/NO here: YES  4. Advise patient to be prepared - "Two hours prior to your appointment, go ahead and check your blood pressure, pulse, oxygen saturation, and your weight (if you have the equipment to check those) and write them all down. When your visit starts, your provider will ask you for this information. If you have an Apple Watch or Kardia device, please plan to have heart rate information ready on the day of your appointment. Please have a pen and paper handy nearby the day of the visit as well."  5. Give patient instructions for MyChart download to smartphone OR Doximity/Doxy.me as below if video visit (depending on what platform provider is using)  6. Inform patient they will receive a phone call 15 minutes prior to their appointment time (may be from unknown caller ID) so they should be prepared to answer    Veronica Shaw has been deemed a candidate for a follow-up tele-health visit to limit community exposure during the Covid-19 pandemic. I spoke with the patient via phone to ensure availability of phone/video source, confirm preferred email &  phone number, and discuss instructions and expectations.  I reminded Veronica Shaw to be prepared with any vital sign and/or heart rhythm information that could potentially be obtained via home monitoring, at the time of her visit. I reminded Veronica Shaw to expect a phone call prior to her visit.  Veronica Shaw 12/25/2018 12:02 PM   INSTRUCTIONS FOR DOWNLOADING THE MYCHART APP TO SMARTPHONE  - The patient must first make sure to have activated MyChart and know their login information - If Apple, go to [employer] and type in MyChart in the search bar and download the app. If  Android, ask patient to go to [employer] and type in [place] in the search bar and download the app. The app is free but as with any other app downloads, their phone may require them to verify saved payment information or Apple/Android password.  - The patient will need to then log into the app with their MyChart username and password, and select [facility] as their healthcare provider to link the account. When it is time for your visit, go to the MyChart app, find appointments, and click Begin Video Visit. Be sure to Select Allow for your device to access the Microphone and Camera for your visit. You will then be connected, and your provider will be with you shortly.  **If they have any issues connecting, or need assistance please contact MyChart service desk [phone])**  **If using a computer, in order to ensure the best quality for their visit they will need to use either of the following Internet Browsers: [employer], or Google Chrome**  IF USING DOXIMITY or DOXY.ME - The patient will receive a link just prior to their visit by text.     FULL LENGTH CONSENT FOR TELE-HEALTH VISIT   I hereby voluntarily request, consent and authorize [facility] and its employed or contracted physicians, physician assistants, nurse practitioners or other licensed health care professionals (the Practitioner), to provide me with telemedicine health care services (the Services") as deemed necessary by the treating Practitioner. I acknowledge and consent to receive the Services by the Practitioner via telemedicine. I understand that the telemedicine visit will involve communicating with the Practitioner through live audiovisual communication technology and the disclosure of certain medical information by electronic transmission. I acknowledge that I have been given the opportunity to request an in-person assessment or other available alternative prior to the telemedicine visit and am  voluntarily participating in the telemedicine visit.  I understand that I have the right to withhold or withdraw my consent to the use of telemedicine in the course of my care at any time, without affecting my right to future care or treatment, and that the Practitioner or I may terminate the telemedicine visit at any time. I understand that I have the right to inspect all information obtained and/or recorded in the course of the telemedicine visit and may receive copies of available information for a reasonable fee.  I understand that some of the potential risks of receiving the Services via telemedicine include:   Delay or interruption in medical evaluation due to technological equipment failure or disruption;  Information transmitted may not be sufficient (e.g. poor resolution of images) to allow for appropriate medical decision making by the Practitioner; and/or   In rare instances, security protocols could fail, causing a breach of personal health information.  Furthermore, I acknowledge that it is my responsibility to provide information about my medical history, conditions and care that  is complete and accurate to the best of my ability. I acknowledge that Practitioner's advice, recommendations, and/or decision may be based on factors not within their control, such as incomplete or inaccurate data provided by me or distortions of diagnostic images or specimens that may result from electronic transmissions. I understand that the practice of medicine is not an exact science and that Practitioner makes no warranties or guarantees regarding treatment outcomes. I acknowledge that I will receive a copy of this consent concurrently upon execution via email to the email address I last provided but may also request a printed copy by calling the office of [facility].    I understand that my insurance will be billed for this visit.   I have read or had this consent read to me.  I understand the  contents of this consent, which adequately explains the benefits and risks of the Services being provided via telemedicine.   I have been provided ample opportunity to ask questions regarding this consent and the Services and have had my questions answered to my satisfaction.  I give my informed consent for the services to be provided through the use of telemedicine in my medical care  By participating in this telemedicine visit I agree to the above.

## 2018-12-26 ENCOUNTER — Other Ambulatory Visit: Payer: Self-pay | Admitting: *Deleted

## 2018-12-26 NOTE — Patient Outreach (Signed)
[facility]) Care Management  12/26/2018   Veronica Shaw July 13, 1951 [id number]  RN Health Coach telephone call to patient.  [employer] compliance verified. per patient she is doing pretty good. Patient stated that she is weighting every day. Today weight is 132 pounds. Patient is not having any shortness of breath. Only with moderate exertion. Per patient very minimal cough with small amount of clear sputum. Patient stated that she is staying in the house. She has a very good support system bring food and toiletries. Patient does have a mask to wear if needing to go out. She has hardly had to use her nebulizer and have not had to use her rescue medication. Per patient her Ruthe Mannan is working great. She is taking it as per ordered. Patient has agreed with further outreach calls.    Current Medications:  Current Outpatient Medications  Medication Sig Dispense Refill  . albuterol (PROVENTIL HFA;VENTOLIN HFA) 108 (90 Base) MCG/ACT inhaler Inhale 2 puffs into the lungs every 4 (four) hours as needed for wheezing or shortness of breath. 18 g 3  . benzonatate (TESSALON) 100 MG capsule Take 1 capsule (100 mg total) by mouth 2 (two) times daily as needed for cough. 20 capsule 1  . Biotin 5000 MCG CAPS Take 1 tablet by mouth daily.    . Calcium Carb-Cholecalciferol 600-800 MG-UNIT TABS Take 1 tablet by mouth daily. (Patient not taking: Reported on 12/18/2018) 90 tablet 3  . carbidopa-levodopa (SINEMET IR) 25-100 MG tablet TAKE 1 TABLET BY MOUTH THREE TIMES DAILY 270 tablet 1  . Coenzyme Q10 (CO Q-10) 100 MG CHEW Chew 1 Dose by mouth daily.    . famotidine (PEPCID) 20 MG tablet TAKE 1 TABLET (20 MG TOTAL) BY MOUTH 2 (TWO) TIMES DAILY. 120 tablet 1  . fluticasone (FLONASE) 50 MCG/ACT nasal spray Place 2 sprays into both nostrils daily. 16 g 6  . furosemide (LASIX) 40 MG tablet Take 0.5 tablets (20 mg total) by mouth daily as needed for fluid (For swelling). 30 tablet 3  . gabapentin (NEURONTIN) 100  MG capsule Take 300mg  (3 pills) at night before bed and 100mg  (1 pill) in the morning with breakfast. 120 capsule 1  . levalbuterol (XOPENEX) 0.63 MG/3ML nebulizer solution 1 vial every 4-6 hours as needed for wheezing and shortness of breath. 180 mL 2  . losartan (COZAAR) 25 MG tablet TAKE 1 TABLET EVERY DAY 90 tablet 2  . meclizine (ANTIVERT) 12.5 MG tablet Take 0.5 tablets (6.25 mg total) by mouth 3 (three) times daily as needed for dizziness. 30 tablet 0  . metFORMIN (GLUCOPHAGE) 500 MG tablet Take 1,000 mg by mouth 2 (two) times daily with a meal.    . metoprolol succinate (TOPROL-XL) 50 MG 24 hr tablet Take 1 tablet (50 mg total) by mouth 2 (two) times daily. 180 tablet 2  . mometasone-formoterol (DULERA) 100-5 MCG/ACT AERO Inhale 2 puffs into the lungs 2 (two) times daily. 1 Inhaler 5  . mometasone-formoterol (DULERA) 200-5 MCG/ACT AERO Inhale 2 puffs into the lungs 2 (two) times daily. 3 Inhaler 3  . oxymetazoline (AFRIN) 0.05 % nasal spray Place 1 spray into both nostrils 2 (two) times daily. 30 mL 0  . potassium chloride (K-DUR,KLOR-CON) 10 MEQ tablet Take 1 tablet (10 mEq total) by mouth as needed (take with lasix as needed for swelling). 30 tablet 2  . spironolactone (ALDACTONE) 25 MG tablet TAKE 1/2 TABLET EVERY DAY 45 tablet 2  . Turmeric 500 MG TABS Take 1  tablet by mouth daily.     No current facility-administered medications for this visit.     Functional Status:  In your present state of health, do you have any difficulty performing the following activities: 12/26/2018 09/27/2018  Hearing? N N  Vision? N N  Difficulty concentrating or making decisions? Y Y  Comment per patient sometimes she gets forgetful per patient she is sometimes forgetful  Walking or climbing stairs? Y N  Comment shortness of breath on exertion difficulty due to shortness of breath and pain in rt arm and hip  Dressing or bathing? N N  Doing errands, shopping? Y -  Comment family does all the shopping  for patient -  Preparing Food and eating ? N N  Using the Toilet? N N  In the past six months, have you accidently leaked urine? Y Y  Comment some stress incont -  Do you have problems with loss of bowel control? N N  Managing your Medications? N N  Managing your Finances? N N  Housekeeping or managing your Housekeeping? N N  Some recent data might be hidden    Fall/Depression Screening: Fall Risk  12/26/2018 09/27/2018 09/24/2018  Falls in the past year? Exclusion - non ambulatory 1 -  Comment - - -  Number falls in past yr: 1 1 -  Injury with Fall? 0 0 -  Comment - - -  Risk Factor Category  - High Risk (2 or more Points) -  Risk for fall due to : History of fall(s);Impaired balance/gait;Impaired mobility History of fall(s);Impaired balance/gait -  Follow up Falls evaluation completed;Falls prevention discussed - (No Data)  Comment - - MD informed   PHQ 2/9 Scores 12/26/2018 11/19/2018 09/27/2018 09/24/2018 05/25/2018 05/03/2018 05/01/2018  PHQ - 2 Score 0 0 0 0 0 0 0  PHQ- 9 Score - - - - - - -  Exception Documentation - - - - - - -   THN CM Care Plan Problem One     Most Recent Value  Care Plan Problem One  Knowledge Deficit in self management of chf  Role Documenting the Problem One  [place] for Problem One  Active  THN Long Term Goal   Patient will report no readmission for CHF and following up onhealth maintenance within the next 90days  THN Long Term Goal Start Date  12/26/18  Interventions for Problem One Long Term Goal  RN reiterated the zones and action plan of CHF and COPD. RN reinforced the need to do daily weights . RN will follow up for further discussions  THN CM Short Term Goal #1   Patient will schedule health maintenance appointments within the next 30 days  THN CM Short Term Goal #1 Start Date  12/26/18  Interventions for Short Term Goal #1  Patient will follow up with rescheduling appointments that have been cancelled due to the coronavirus. RN will  follow up for further discussion and compliance       Assessment:  Patient is in the green zone for CHF and COPD Patient is taking medications as per ordered Patient is doing daily weights Today weight is 132 pounds Patient has not had to use her nebulizer as much Patient appetite is good Patient is trying to at small portions  Plan:  RN reiterated the zones and action plan of CHF and COPD RN discussed coronavirus precautions Patient will reschedule appointments cancelled due to coronavirus Patient is using pandemic safety precautions RN will follow  up outreach within the month August  Veronica Shaw [facility] [phone]

## 2018-12-27 ENCOUNTER — Other Ambulatory Visit: Payer: Self-pay

## 2018-12-27 ENCOUNTER — Telehealth (INDEPENDENT_AMBULATORY_CARE_PROVIDER_SITE_OTHER): Payer: Medicare Other | Admitting: Internal Medicine

## 2018-12-27 DIAGNOSIS — I5022 Chronic systolic (congestive) heart failure: Secondary | ICD-10-CM

## 2018-12-27 DIAGNOSIS — Z95810 Presence of automatic (implantable) cardiac defibrillator: Secondary | ICD-10-CM

## 2018-12-27 DIAGNOSIS — I110 Hypertensive heart disease with heart failure: Secondary | ICD-10-CM | POA: Diagnosis not present

## 2018-12-27 NOTE — Progress Notes (Signed)
Electrophysiology TeleHealth Note   Due to national recommendations of social distancing due to COVID 19, an audio/video telehealth visit is felt to be most appropriate for this patient at this time.  See MyChart message from today for the patient's consent to telehealth for [facility].   Date:  12/27/2018   ID:  Veronica Shaw, DOB 17-Nov-1950, MRN [id number]  Location: patient's home  Provider location: [address] [place]  Evaluation Performed: Follow-up visit  PCP:  Caroline More, DO  Cardiologist:  Sherren Mocha, MD  Electrophysiologist:  [name]  Chief Complaint:  "I'm blessed. I was feeling bad but now I'm better."  History of Present Illness:    Veronica Shaw is a 68 y.o. female who presents via audio/video conferencing for a telehealth visit today. She is a pleasant 68 yo woman with a h/o Chronic systolic heart failure, s/p ICD insertion, cOPD, who had developed an URI a month ago. She has done well in the interim. Today, she denies symptoms of palpitations, chest pain, shortness of breath,  lower extremity edema, dizziness, presyncope, or syncope.  The patient is otherwise without complaint today.  The patient denies symptoms of fevers, chills, cough, or new SOB worrisome for COVID 19.  Past Medical History:  Diagnosis Date  . Asthma   . Chronic systolic CHF (congestive heart failure) ([place])    a. cMRI 4/15: EF 34% and findings - c/w NICM, normal RV size and function (RVEF 61%), Mild MR // b. Echo 2/15:  EF 30-35%, diff HK, ant-sept AK, Gr 2 DD, mild MR, trivial TR  //  c. Echo 5/17: EF 20-25%, severe diffuse HK, marked systolic dyssynchrony, grade 1 diastolic dysfunction, mild MR  //  d. RHC 5/17: Fick CO 2.9, RVSP 19, PASP 15, PW mean 2, low filing pressures and preserved CO   . Diabetes mellitus   . Flu 10/17/2017  . Gastritis   . History of echocardiogram    Echo 6/18: EF 30-35, diffuse HK, grade 1 diastolic dysfunction, trivial MR, mild LAE,  mild TR, no pericardial effusion  . History of nuclear stress test    Myoview 5/18: EF 49, no ischemia, inferoseptal defect c/w LBBB artifact (intermediate risk due to EF < 50).  [name] Kitchen HTN (hypertension)   . Hyperlipidemia   . NICM (nonischemic cardiomyopathy) ([place])    a. Nuclear 5/13: Normal stress nuclear study. LV Ejection Fraction: 58%  //  b. LHC 10/14: Minor luminal irregularity in prox LAD, EF 35%   . Parkinson disease ([place])   . Plantar fasciitis   . Sleep apnea    was retested and no longer had it and so d/c CPAP  . Urticaria     Past Surgical History:  Procedure Laterality Date  . BUNIONECTOMY    . CARDIAC CATHETERIZATION N/A 12/16/2015   Procedure: Right Heart Cath;  Surgeon: Sherren Mocha, MD;  Location: [place] [facility] CV LAB;  Service: Cardiovascular;  Laterality: N/A;  . CHOLECYSTECTOMY    . IMPLANTABLE CARDIOVERTER DEFIBRILLATOR IMPLANT  11-25-13   MDT dual chamber ICD implanted by [name] for primary prevention  . IMPLANTABLE CARDIOVERTER DEFIBRILLATOR IMPLANT N/A 11/25/2013   Procedure: IMPLANTABLE CARDIOVERTER DEFIBRILLATOR IMPLANT;  Surgeon: [name], MD;  Location: [facility] CATH LAB;  Service: Cardiovascular;  Laterality: N/A;  . LEFT AND RIGHT HEART CATHETERIZATION WITH CORONARY ANGIOGRAM N/A 05/31/2013   Procedure: LEFT AND RIGHT HEART CATHETERIZATION WITH CORONARY ANGIOGRAM;  Surgeon: [name], MD;  Location: [facility]  CATH LAB;  Service: Cardiovascular;  Laterality: N/A;  . TONSILLECTOMY    . TUBAL LIGATION      Current Outpatient Medications  Medication Sig Dispense Refill  . albuterol (PROVENTIL HFA;VENTOLIN HFA) 108 (90 Base) MCG/ACT inhaler Inhale 2 puffs into the lungs every 4 (four) hours as needed for wheezing or shortness of breath. 18 g 3  . benzonatate (TESSALON) 100 MG capsule Take 1 capsule (100 mg total) by mouth 2 (two) times daily as needed for cough. 20 capsule 1  . Biotin 5000 MCG CAPS Take 1 tablet by mouth daily.    . Calcium Carb-Cholecalciferol  600-800 MG-UNIT TABS Take 1 tablet by mouth daily. (Patient not taking: Reported on 12/18/2018) 90 tablet 3  . carbidopa-levodopa (SINEMET IR) 25-100 MG tablet TAKE 1 TABLET BY MOUTH THREE TIMES DAILY 270 tablet 1  . Coenzyme Q10 (CO Q-10) 100 MG CHEW Chew 1 Dose by mouth daily.    . famotidine (PEPCID) 20 MG tablet TAKE 1 TABLET (20 MG TOTAL) BY MOUTH 2 (TWO) TIMES DAILY. 120 tablet 1  . fluticasone (FLONASE) 50 MCG/ACT nasal spray Place 2 sprays into both nostrils daily. 16 g 6  . furosemide (LASIX) 40 MG tablet Take 0.5 tablets (20 mg total) by mouth daily as needed for fluid (For swelling). 30 tablet 3  . gabapentin (NEURONTIN) 100 MG capsule Take 300mg  (3 pills) at night before bed and 100mg  (1 pill) in the morning with breakfast. 120 capsule 1  . levalbuterol (XOPENEX) 0.63 MG/3ML nebulizer solution 1 vial every 4-6 hours as needed for wheezing and shortness of breath. 180 mL 2  . losartan (COZAAR) 25 MG tablet TAKE 1 TABLET EVERY DAY 90 tablet 2  . meclizine (ANTIVERT) 12.5 MG tablet Take 0.5 tablets (6.25 mg total) by mouth 3 (three) times daily as needed for dizziness. 30 tablet 0  . metFORMIN (GLUCOPHAGE) 500 MG tablet Take 1,000 mg by mouth 2 (two) times daily with a meal.    . metoprolol succinate (TOPROL-XL) 50 MG 24 hr tablet Take 1 tablet (50 mg total) by mouth 2 (two) times daily. 180 tablet 2  . mometasone-formoterol (DULERA) 100-5 MCG/ACT AERO Inhale 2 puffs into the lungs 2 (two) times daily. 1 Inhaler 5  . mometasone-formoterol (DULERA) 200-5 MCG/ACT AERO Inhale 2 puffs into the lungs 2 (two) times daily. 3 Inhaler 3  . oxymetazoline (AFRIN) 0.05 % nasal spray Place 1 spray into both nostrils 2 (two) times daily. 30 mL 0  . potassium chloride (K-DUR,KLOR-CON) 10 MEQ tablet Take 1 tablet (10 mEq total) by mouth as needed (take with lasix as needed for swelling). 30 tablet 2  . spironolactone (ALDACTONE) 25 MG tablet TAKE 1/2 TABLET EVERY DAY 45 tablet 2  . Turmeric 500 MG TABS Take  1 tablet by mouth daily.     No current facility-administered medications for this visit.     Allergies:   Aspirin; Penicillins; Prempro [conj estrog-medroxyprogest ace]; Ace inhibitors; and Simvastatin   Social History:  The patient  reports that she has never smoked. She has never used smokeless tobacco. She reports that she does not drink alcohol or use drugs.   Family History:  The patient's  family history includes Coronary artery disease in her father and mother; Diabetes in her father, mother, sister, and sister; Diabetes (age of onset: 65) in her son; Heart attack in her father and mother; Heart disease in her sister, sister, and sister; Hepatitis C in her sister; Parkinson's disease in her sister;  Stroke in her mother.   ROS:  Please see the history of present illness.   All other systems are personally reviewed and negative.    Exam:    Vital Signs:  LMP 08/01/2000 (Approximate)   Well appearing, alert and conversant, regular work of breathing,  good skin color Eyes- anicteric, neuro- grossly intact, skin- no apparent rash or lesions or cyanosis, mouth- oral mucosa is pink   Labs/Other Tests and Data Reviewed:    Recent Labs: 05/24/2018: ALT 6; Hemoglobin 12.8; Platelets 180 08/15/2018: BUN 16; Creatinine, Ser 1.26; NT-Pro BNP 310; Potassium 4.2; Sodium 141   Wt Readings from Last 3 Encounters:  12/06/18 132 lb (59.9 kg)  09/24/18 137 lb 6.4 oz (62.3 kg)  08/27/18 135 lb 6.4 oz (61.4 kg)     Other studies personally reviewed: Last device remote is reviewed from [facility] PDF dated 11/30/18 which reveals normal device function, no arrhythmias    ASSESSMENT & PLAN:    1.  Chronic systolic heart failure - her symptoms are class 2. She will continue her current meds. 2.ICD - her medtronic DDD ICD is working normally. 3. HTN - she will continue her current meds. 4. COVID 19 screen The patient denies symptoms of COVID 19 at this time.  The importance of social distancing  was discussed today.  Follow-up:  6 months with me. Next remote: 8/20  Current medicines are reviewed at length with the patient today.   The patient does not have concerns regarding her medicines.  The following changes were made today:  none  Labs/ tests ordered today include: none No orders of the defined types were placed in this encounter.    Patient Risk:  after full review of this patients clinical status, I feel that they are at moderate risk at this time.  Today, I have spent 15 minutes with the patient with telehealth technology discussing all of the above.    Signed, Cristopher Peru, MD  12/27/2018 2:13 PM     [address] [place] [address] [phone] (office) [phone] (fax)

## 2019-01-07 NOTE — Progress Notes (Signed)
Virtual Visit via Telephone Note The purpose of this virtual visit is to provide medical care while limiting exposure to the novel coronavirus.    Consent was obtained for telephone visit:  Yes.   Answered questions that patient had about telephone interaction:  Yes.   I discussed the limitations, risks, security and privacy concerns of performing an evaluation and management service by telephone. I also discussed with the patient that there may be a patient responsible charge related to this service. The patient expressed understanding and agreed to proceed.  Pt location: Home Physician Location: office Name of referring provider:  Caroline More, DO I connected with Veronica Shaw at patients initiation/request on 01/09/2019 at  9:45 AM EDT by telephone and verified that I am speaking with the correct person using two identifiers. Pt MRN:  [id number] Pt DOB:  [birth year] Video Participants:  Veronica Shaw;     History of Present Illness:  Pt seen in f/u for PD.  "I am doing great.  I don't think I have Parkinson's disease."  Supposed to be on carbidopa/levodopa 25/100, 1 tablet 3 times per day but states that she is only taking 1 tablet twice per day.  Is having cramping but "magnesium is helping at night.  "My biggest problem is balance and I have had this for 20 years and they gave me meclizine but only for a few days."  Pt denies falls "recently."  Pt denies lightheadedness, near syncope.  No hallucinations.  Mood has been good.  Records have been reviewed since last visit.  Saw cardiology through telehealth on May 28.  No medication changes were made.  Seeing Dr. Ralene Ok and has myelogram scheduled. "I get stiff because of my spine."  Pt concerned about memory change.  Had to pull over when driving b/c got lost and only 3 blocks from home.   PREVIOUS MEDICATIONS: Sinemet (pt was only taking bid and didn't think helped); mirapex (given but didn't take); tried carbidopa/levodopa 50/200 CR at  bed for cramping 1 night but "didn't feel right" and stopped it  Current Outpatient Medications on File Prior to Visit  Medication Sig Dispense Refill   albuterol (PROVENTIL HFA;VENTOLIN HFA) 108 (90 Base) MCG/ACT inhaler Inhale 2 puffs into the lungs every 4 (four) hours as needed for wheezing or shortness of breath. 18 g 3   Biotin 5000 MCG CAPS Take 1 tablet by mouth daily.     Calcium Carb-Cholecalciferol 600-800 MG-UNIT TABS Take 1 tablet by mouth daily. 90 tablet 3   carbidopa-levodopa (SINEMET IR) 25-100 MG tablet TAKE 1 TABLET BY MOUTH THREE TIMES DAILY 270 tablet 1   famotidine (PEPCID) 20 MG tablet TAKE 1 TABLET (20 MG TOTAL) BY MOUTH 2 (TWO) TIMES DAILY. 120 tablet 1   fluticasone (FLONASE) 50 MCG/ACT nasal spray Place 2 sprays into both nostrils daily. 16 g 6   furosemide (LASIX) 40 MG tablet Take 0.5 tablets (20 mg total) by mouth daily as needed for fluid (For swelling). 30 tablet 3   gabapentin (NEURONTIN) 100 MG capsule Take 300mg  (3 pills) at night before bed and 100mg  (1 pill) in the morning with breakfast. 120 capsule 1   levalbuterol (XOPENEX) 0.63 MG/3ML nebulizer solution 1 vial every 4-6 hours as needed for wheezing and shortness of breath. 180 mL 2   losartan (COZAAR) 25 MG tablet TAKE 1 TABLET EVERY DAY 90 tablet 2   meclizine (ANTIVERT) 12.5 MG tablet Take 0.5 tablets (6.25 mg total) by mouth 3 (three) times  daily as needed for dizziness. 30 tablet 0   metFORMIN (GLUCOPHAGE) 500 MG tablet Take 1,000 mg by mouth 2 (two) times daily with a meal.     metoprolol succinate (TOPROL-XL) 50 MG 24 hr tablet Take 1 tablet (50 mg total) by mouth 2 (two) times daily. 180 tablet 2   mometasone-formoterol (DULERA) 100-5 MCG/ACT AERO Inhale 2 puffs into the lungs 2 (two) times daily. 1 Inhaler 5   mometasone-formoterol (DULERA) 200-5 MCG/ACT AERO Inhale 2 puffs into the lungs 2 (two) times daily. 3 Inhaler 3   potassium chloride (K-DUR,KLOR-CON) 10 MEQ tablet Take 1  tablet (10 mEq total) by mouth as needed (take with lasix as needed for swelling). 30 tablet 2   spironolactone (ALDACTONE) 25 MG tablet TAKE 1/2 TABLET EVERY DAY 45 tablet 2   Turmeric 500 MG TABS Take 1 tablet by mouth daily.     No current facility-administered medications on file prior to visit.      Observations/Objective:   Vitals:   01/09/19 0806  Weight: 130 lb (59 kg)  Height: 5\' 1"  (1.549 m)   She is alert and oriented x 3.  She asks about this examiners children and remembers that there are twins   Assessment and Plan:   1.  Parkinson's disease  -noncompliant  -encouraged her again to take carbidopa/levodopa 25/100 tid (only take it bid).  Admits to stiffness and loss of balance but attributes all to back issues  -Has nighttime cramping, but doesn't want to take carbidopa/levodopa 25/100 CR at bedtime.  Does state that Magnesium is helping 2.  Lumbar spinal stenosis  -is following with neurosx.  Has myelogram scheduled per pt 3.  Congestive heart failure  -Is following with cardiology. 4.  Memory loss  -hold driving  -could be medication related  -schedule neurocognitive testing (pt wants to go to [place])  Follow Up Instructions:  5-6 months  -I discussed the assessment and treatment plan with the patient. The patient was provided an opportunity to ask questions and all were answered. The patient agreed with the plan and demonstrated an understanding of the instructions.   The patient was advised to call back or seek an in-person evaluation if the symptoms worsen or if the condition fails to improve as anticipated.    Total Time spent in visit with the patient was:  13 min, of which more than 50% of the time was spent in counseling.   Pt understands and agrees with the plan of care outlined.     Alonza Bogus, DO

## 2019-01-09 ENCOUNTER — Telehealth: Payer: Self-pay

## 2019-01-09 ENCOUNTER — Other Ambulatory Visit: Payer: Self-pay

## 2019-01-09 ENCOUNTER — Encounter: Payer: Self-pay | Admitting: Neurology

## 2019-01-09 ENCOUNTER — Telehealth (INDEPENDENT_AMBULATORY_CARE_PROVIDER_SITE_OTHER): Payer: Medicare Other | Admitting: Neurology

## 2019-01-09 DIAGNOSIS — G20 Parkinson's disease: Secondary | ICD-10-CM

## 2019-01-09 DIAGNOSIS — R413 Other amnesia: Secondary | ICD-10-CM | POA: Diagnosis not present

## 2019-01-09 NOTE — Telephone Encounter (Signed)
-----   Message from [place], DO sent at 01/09/2019  9:40 AM EDT ----- [place]  Please fax and call referral to [place] neuropsychology (traditionally we have trouble getting their faxes to go through, esp with our fax at the back) for neuropsych testing.  Dx:  memory loss, PD.  Hinton Dyer,  Please call patient with a follow up visit in 6 months.  Thank you!

## 2019-01-09 NOTE — Telephone Encounter (Signed)
Order placed fax

## 2019-01-14 ENCOUNTER — Ambulatory Visit (INDEPENDENT_AMBULATORY_CARE_PROVIDER_SITE_OTHER): Payer: Medicare Other

## 2019-01-14 ENCOUNTER — Telehealth: Payer: Self-pay

## 2019-01-14 DIAGNOSIS — I5022 Chronic systolic (congestive) heart failure: Secondary | ICD-10-CM

## 2019-01-14 DIAGNOSIS — Z95810 Presence of automatic (implantable) cardiac defibrillator: Secondary | ICD-10-CM

## 2019-01-14 NOTE — Progress Notes (Signed)
EPIC Encounter for ICM Monitoring  Patient Name: Veronica Shaw is a 68 y.o. female Date: 01/14/2019 Primary Care Physican: Caroline More, DO Primary Cardiologist:Cooper/Weaver PA Electrophysiologist: Lovena Le LastWeight: 135lbs 12/10/2018 Weight:136lbs  Attempted call to patient and unable to reach.  Transmission reviewed.      OptiVolThoracic impedancesuggesting possible fluid accumulation starting 01/10/2019 and is ongoing.   Prescribed:Furosemide40 mgTake 0.5 tablets (20 mg total) by mouth daily as needed for fluid (For swelling).   Labs: 08/15/2018 Creatinine 1.26, BUN 16, Potassium 4.2, Sodium 141, GFR 44-50 05/24/2018 Cretainine 1.26, BUN 25, Potassium 4.1, Sodium 140, GFR 42-49 11/30/2017 Creatinine 1.45, BUN 22, Potassium 5.3, Sodium 140, GFR 37-43 10/19/2017 Creatinine1.16, BUN18, Potassium4.1, Sodium139, GFR47-55 10/18/2017 Creatinine1.24, BUN17, Potassium4.4, [id number], GFR44-51  10/17/2017 Creatinine1.46, BUN23, Potassium4.8, Sodium135, [id number]  10/15/2017 Creatinine1.61, BUN24, Potassium4.0, [id number], [id number]  Recommendations: Unable to reach.  If patient reached, will advise to take PRN Furosemide x 3 days.   Follow-up plan: ICM clinic phone appointment on6/26/2020 to recheck fluid levels.  Copy of ICM check sent to Dr.Taylorand Richardson Dopp, PA.  3 month ICM trend: 01/14/2019    1 Year ICM trend:       [name], RN 01/14/2019 3:29 PM

## 2019-01-14 NOTE — Telephone Encounter (Signed)
Remote ICM transmission received.  Attempted call to patient regarding ICM remote transmission and no answer.  

## 2019-01-16 NOTE — Progress Notes (Addendum)
Office Visit Note  Patient: Veronica Shaw             Date of Birth: 1951/01/20           MRN: [id number]             PCP: Caroline More, DO Referring: Lauraine Rinne, NP Visit Date: 01/30/2019 Occupation: retired  Subjective:  Pain in multiple joints.   History of Present Illness: Veronica Shaw is a 68 y.o. female seen in consultation per request of her PCP.  According to patient her symptoms started about 20 years ago with pain in multiple joints.  She states she moved from [place] to [place] at the time.  She did not have insurance for several years.  She states she was seen a rheumatologist who did some lab work and gave her medications which helped her but she could not afford the medication and lost follow-up.  She has not seen a rheumatologist since then.  She has been getting pain medications from PCP and pain clinic.  She has had chronic discomfort in her neck and lower back.  She also suffered from sciatica and lost her job.  She states she has been experiencing pain in her left shoulder, bilateral wrist joints, bilateral hands, bilateral knee joints and her feet.  She notices swelling in her knee joints in her hands.  Activities of Daily Living:  Patient reports morning stiffness for several hours.   Patient Reports nocturnal pain.  Difficulty dressing/grooming: Denies Difficulty climbing stairs: Reports Difficulty getting out of chair: Denies Difficulty using hands for taps, buttons, cutlery, and/or writing: Denies  Review of Systems  Constitutional: Positive for fatigue. Negative for night sweats, weight gain and weight loss.  HENT: Positive for mouth dryness. Negative for mouth sores, trouble swallowing, trouble swallowing and nose dryness.   Eyes: Negative for pain, redness, itching, visual disturbance and dryness.  Respiratory: Negative for cough, shortness of breath and difficulty breathing.   Cardiovascular: Negative for chest pain, palpitations, hypertension,  irregular heartbeat and swelling in legs/feet.  Gastrointestinal: Positive for constipation and diarrhea. Negative for blood in stool.  Endocrine: Positive for increased urination.  Genitourinary: Negative for painful urination and vaginal dryness.  Musculoskeletal: Positive for arthralgias, joint pain, joint swelling and morning stiffness. Negative for myalgias, muscle weakness, muscle tenderness and myalgias.  Skin: Negative for color change, rash, hair loss, redness, skin tightness, ulcers and sensitivity to sunlight.  Allergic/Immunologic: Negative for susceptible to infections.  Neurological: Positive for headaches and memory loss. Negative for dizziness, light-headedness, night sweats and weakness.  Hematological: Negative for swollen glands.  Psychiatric/Behavioral: Negative for depressed mood, confusion and sleep disturbance. The patient is not nervous/anxious.     PMFS History:  Patient Active Problem List   Diagnosis Date Noted   Viral URI with cough 12/08/2018   Medication management 11/19/2018   Healthcare maintenance 11/19/2018   Cervical disc disorder with radiculopathy of cervical region 09/24/2018   Difficulty voiding 05/25/2018   Generalized abdominal pain 05/25/2018   Muscle cramp 12/03/2017   Pain of right upper extremity 12/03/2017   Cough 10/27/2017   Cognitive impairment 10/19/2017   Syncope    Asthma with COPD with exacerbation ([place])    Multiple falls    Asthma exacerbation 08/03/2017   Dense breast tissue on mammogram 05/30/2017   Hair loss 07/21/2016   Allergic rhinitis 11/09/2015   Upper airway cough syndrome 08/20/2015   Osteopenia 07/10/2015   Mild persistent asthma in adult  without complication 76/28/3151   Bunion, left foot 05/20/2014   NICM (nonischemic cardiomyopathy) ([place]) 04/23/2014   Arthritis or polyarthritis, rheumatoid ([place]) 03/12/2014   ICD (implantable cardioverter-defibrillator), dual, in situ 03/06/2014    Thoracic or lumbosacral neuritis or radiculitis, unspecified 76/16/0737   Chronic systolic heart failure ([place]) 05/31/2013   Parkinson disease ([place]) 05/21/2013   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[place]) 05/01/2013   Major depression 03/31/2013   Balance problem 03/03/2013   Lower extremity edema 03/03/2013   GERD (gastroesophageal reflux disease) 09/27/2012   Dyspnea 09/19/2012   Plantar fasciitis, bilateral 06/15/2012   Cervical pain (neck) 04/12/2012   Spondyloarthropathy 04/04/2012   Diabetic peripheral neuropathy ([place]) 12/09/2011   Hyperlipidemia 12/09/2011   Sleep apnea 12/09/2011   Fatigue 10/13/2011   Chronic urticaria 05/27/2011   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 05/20/2011   Sciatic leg pain 07/22/2009   ROSACEA 05/29/2009   ACHILLES BURSITIS OR TENDINITIS 03/27/2009   Type II diabetes mellitus with complication ([place]) 10/62/6948   Essential hypertension 09/28/2006    Past Medical History:  Diagnosis Date   Asthma    Chronic systolic CHF (congestive heart failure) ([place])    a. cMRI 4/15: EF 34% and findings - c/w NICM, normal RV size and function (RVEF 61%), Mild MR // b. Echo 2/15:  EF 30-35%, diff HK, ant-sept AK, Gr 2 DD, mild MR, trivial TR  //  c. Echo 5/17: EF 20-25%, severe diffuse HK, marked systolic dyssynchrony, grade 1 diastolic dysfunction, mild MR  //  d. [place] 5/17: Fick CO 2.9, RVSP 19, PASP 15, PW mean 2, low filing pressures and preserved CO    Diabetes mellitus    Flu 10/17/2017   Gastritis    History of echocardiogram    Echo 6/18: EF 30-35, diffuse HK, grade 1 diastolic dysfunction, trivial MR, mild LAE, mild TR, no pericardial effusion   History of nuclear stress test    Myoview 5/18: EF 49, no ischemia, inferoseptal defect c/w LBBB artifact (intermediate risk due to EF < 50).   HTN (hypertension)    Hyperlipidemia    NICM (nonischemic cardiomyopathy) ([place])    a. Nuclear 5/13: Normal stress nuclear study. LV Ejection Fraction: 58%  //  b.  LHC 10/14: Minor luminal irregularity in prox LAD, EF 35%    Parkinson disease (HCC)    Plantar fasciitis    Sleep apnea    was retested and no longer had it and so d/c CPAP   Urticaria     Family History  Problem Relation Age of Onset   Coronary artery disease Father        Died age 8   Heart attack Father    Diabetes Father    Coronary artery disease Mother        Died age 34   Heart attack Mother    Diabetes Mother    Parkinson's disease Sister    Heart disease Sister    Hepatitis C Sister    Diabetes Son 70       T1DM   Diabetes Sister    Heart disease Sister    Diabetes Sister    Heart disease Sister    Breast cancer Neg Hx    Past Surgical History:  Procedure Laterality Date   BREAST SURGERY Left    BUNIONECTOMY     CARDIAC CATHETERIZATION N/A 12/16/2015   Procedure: Right Heart Cath;  Surgeon: Sherren Mocha, MD;  Location: [facility];  Service: Cardiovascular;  Laterality: N/A;   CHOLECYSTECTOMY  IMPLANTABLE CARDIOVERTER DEFIBRILLATOR IMPLANT  11-25-13   MDT dual chamber ICD implanted by [name] for primary prevention   IMPLANTABLE CARDIOVERTER DEFIBRILLATOR IMPLANT N/A 11/25/2013   Procedure: IMPLANTABLE CARDIOVERTER DEFIBRILLATOR IMPLANT;  Surgeon: [name], MD;  Location: [facility] CATH LAB;  Service: Cardiovascular;  Laterality: N/A;   LEFT AND RIGHT HEART CATHETERIZATION WITH CORONARY ANGIOGRAM N/A 05/31/2013   Procedure: LEFT AND RIGHT HEART CATHETERIZATION WITH CORONARY ANGIOGRAM;  Surgeon: [name], MD;  Location: [facility] CATH LAB;  Service: Cardiovascular;  Laterality: N/A;   TONSILLECTOMY     TUBAL LIGATION     Social History   Social History Narrative   Lives at home with husband and the dog.   Immunization History  Administered Date(s) Administered   Influenza Split 06/17/2011, 04/18/2012   Influenza Whole 05/17/2007, 06/14/2010   Influenza,inj,Quad PF,6+ Mos 05/20/2014, 06/04/2015, 03/22/2016,  05/30/2017, 04/12/2018   Influenza,inj,Quad PF,6-35 Mos 05/03/2013   Pneumococcal Conjugate-13 05/20/2014   Pneumococcal Polysaccharide-23 05/17/2007, 06/01/2013   Td 03/01/2006   Zoster 08/01/2009     Objective: Vital Signs: BP 117/60 (BP Location: Right Arm, Patient Position: Sitting, Cuff Size: Normal)    Pulse 78    Resp 13    Ht 5\' 1"  (1.549 m)    Wt 140 lb 6.4 oz (63.7 kg)    LMP 08/01/2000 (Approximate)    BMI 26.53 kg/m    Physical Exam Vitals signs and nursing note reviewed.  Constitutional:      Appearance: She is well-developed.  HENT:     Head: Normocephalic and atraumatic.  Eyes:     Conjunctiva/sclera: Conjunctivae normal.  Neck:     Musculoskeletal: Normal range of motion.  Cardiovascular:     Rate and Rhythm: Normal rate and regular rhythm.     Heart sounds: Normal heart sounds.  Pulmonary:     Effort: Pulmonary effort is normal.     Breath sounds: Normal breath sounds.  Abdominal:     General: Bowel sounds are normal.     Palpations: Abdomen is soft.  Lymphadenopathy:     Cervical: No cervical adenopathy.  Skin:    General: Skin is warm and dry.     Capillary Refill: Capillary refill takes less than 2 seconds.  Neurological:     Mental Status: She is alert and oriented to person, place, and time.  Psychiatric:        Behavior: Behavior normal.      Musculoskeletal Exam: C-spine thoracic and lumbar spine with limited range of motion.  Shoulder joints elbow joints were in good range of motion.  She had discomfort range of motion of bilateral shoulder joints.  She had good range of motion of her MCPs PIPs and DIPs.  Thickening of MCPs PIPs DIPs was noted.  No synovitis was noted.  Hip joints knee joints ankles MTPs PIPs DIPs with good range of motion.  She had some bilateral changes in her MTPs PIPs and DIPs of her feet consistent with osteoarthritis.  No synovitis was noted.  CDAI Exam: CDAI Score: -- Patient Global: --; Provider Global: -- Swollen:  --; Tender: -- Joint Exam   No joint exam has been documented for this visit   There is currently no information documented on the homunculus. Go to the Rheumatology activity and complete the homunculus joint exam.  Investigation: No additional findings.  Imaging: Xr Foot 2 Views Left  Result Date: 01/30/2019 Hallux valgus deformity with narrowing of the first MTP joint was noted.  PIP and DIP  narrowing was noted.  Dorsal spurring was noted.  Posterior calcaneal spur was noted.  No erosive changes were noted. Impression: These findings are consistent with osteoarthritis of the foot.  Xr Foot 2 Views Right  Result Date: 01/30/2019 Severe narrowing of first MTP joint PIP and DIP joint was noted.  None of the other MTP joints showed any narrowing or erosive changes.  No intertarsal or tibiotalar joint space narrowing was noted.  Posterior calcaneal spur was noted. Impression: These findings are consistent with osteoarthritis of the foot.  Xr Hand 2 View Left  Result Date: 01/30/2019 Severe CMC narrowing and subluxation was noted.  PIP and DIP narrowing was noted.  No significant MCP joint narrowing was noted.  No intercarpal radiocarpal joint space narrowing was noted.  No erosive changes were noted. Impression: These findings are consistent with osteoarthritis of the hand.  Xr Hand 2 View Right  Result Date: 01/30/2019 Severe CMC joint narrowing and subluxation was noted.  Mild second and third MCP joint narrowing was noted.  PIP and DIP severe narrowing was noted.  No significant intercarpal radiocarpal joint space narrowing was noted.  No erosive changes were noted. Impression: These findings are consistent with osteoarthritis and possible inflammatory arthritis.  Xr Knee 3 View Left  Result Date: 01/30/2019 Moderate medial compartment narrowing and moderate patellofemoral narrowing was noted.  No chondrocalcinosis was noted. Impression: These findings are consistent with moderate  osteoarthritis and moderate chondromalacia patella.  Xr Knee 3 View Right  Result Date: 01/30/2019 Severe medial compartment narrowing and mild patellofemoral narrowing was noted.  No chondrocalcinosis was noted. Impression: These findings are consistent with severe osteoarthritis and mild chondromalacia patella.  Xr Shoulder Left  Result Date: 01/30/2019 No glenohumeral or acromioclavicular joint space narrowing was noted.  No chondrocalcinosis was noted. Impression: Unremarkable x-ray of the shoulder joint.  Xr Shoulder Right  Result Date: 01/30/2019 No glenohumeral or acromioclavicular joint space narrowing was noted.  No chondrocalcinosis was noted. Impression: Unremarkable x-ray of the shoulder joint.   Recent Labs: Lab Results  Component Value Date   WBC 6.5 05/24/2018   HGB 12.8 05/24/2018   PLT 180 05/24/2018   NA 141 08/15/2018   K 4.2 08/15/2018   CL 101 08/15/2018   CO2 23 08/15/2018   GLUCOSE 112 (H) 08/15/2018   BUN 16 08/15/2018   CREATININE 1.26 (H) 08/15/2018   BILITOT 0.6 05/24/2018   ALKPHOS 83 05/24/2018   AST 22 05/24/2018   ALT 6 05/24/2018   PROT 6.6 05/24/2018   ALBUMIN 3.9 05/24/2018   CALCIUM 10.1 08/15/2018   GFRAA 50 (L) 08/15/2018    Speciality Comments: Pertinent PMH: CHF, Hyperlipidemia, DM II, Parkinson's  Procedures:  No procedures performed Allergies: Aspirin, Penicillins, Prempro [conj estrog-medroxyprogest ace], Ace inhibitors, and Simvastatin   Assessment / Plan:     Visit Diagnoses: Pain in both hands -patient states that she was diagnosed with rheumatoid arthritis several years ago by Dr. Lenna Gilford.  She was also given some medication which was helpful.  I do not see any synovitis on examination.  Although she appears to have some thickening of the MCPs PIPs and DIPs.  The x-rays obtained today are consistent with severe osteoarthritis.  Mild narrowing of MCP joints was noted.  No erosive changes were noted.  I will obtain following labs  today.  Plan: XR Hand 2 View Right, XR Hand 2 View Left, Sedimentation rate, Rheumatoid factor, Cyclic citrul peptide antibody, IgG, ANA, 14-3-3 eta Protein, Uric acid,  Pain  in both feet -she complains of chronic discomfort in her feet.  No synovitis was noted.  Clinical findings were consistent with osteoarthritis.  X-ray findings were consistent with osteoarthritis.  Plan: XR Foot 2 Views Right, XR Foot 2 Views Left,   Chronic pain of both knees -x-ray showed moderate osteoarthritis in the left knee joint and severe osteoarthritis in the right knee joint.  Bilateral chondromalacia patella was noted.  Plan: XR KNEE 3 VIEW RIGHT, XR KNEE 3 VIEW LEFT,  Chronic pain of both shoulders - Plan: XR Shoulder Left, XR Shoulder Right, x-rays of shoulder joints were unremarkable.  High risk medication use -patient states that she has been on immunosuppressive therapy in the past.  Have her obtain following labs in case we have to use any treatment in the future.  Plan: CBC with Differential/Platelet, COMPLETE METABOLIC PANEL WITH GFR, Hepatitis B core antibody, IgM, Hepatitis B surface antigen, Hepatitis C antibody, QuantiFERON-TB Gold Plus, Serum protein electrophoresis with reflex, IgG, IgA, IgM, Glucose 6 phosphate dehydrogenase,   Dysuria -patient complains of dysuria and frequency.  I have advised her to follow-up with PCP.  UA was obtained per her request. plan: Urinalysis, Routine w reflex microscopic,  DDD (degenerative disc disease), cervical -she has discomfort in her cervical spine and goes to pain management.  DDD (degenerative disc disease), lumbar -she has chronic pain in her lower back.  Bunion, left foot  Other medical problems are listed as follows:  Chronic urticaria   Osteopenia, unspecified location  Diabetic peripheral neuropathy (HCC)   Type II diabetes mellitus with complication ([place])   Parkinson disease ([place])  Asthma with COPD with exacerbation ([place])   Essential  hypertension   NICM (nonischemic cardiomyopathy) ([place])   Chronic systolic heart failure (HCC)  Hair loss   Cognitive impairment  History of hyperlipidemia   History of depression    Orders: Orders Placed This Encounter  Procedures   XR Shoulder Left   XR Shoulder Right   XR Hand 2 View Right   XR Hand 2 View Left   XR Foot 2 Views Right   XR Foot 2 Views Left   XR KNEE 3 VIEW RIGHT   XR KNEE 3 VIEW LEFT   CBC with Differential/Platelet   COMPLETE METABOLIC PANEL WITH GFR   Urinalysis, Routine w reflex microscopic   Sedimentation rate   Rheumatoid factor   Cyclic citrul peptide antibody, IgG   ANA   14-3-3 eta Protein   Uric acid   Hepatitis B core antibody, IgM   Hepatitis B surface antigen   Hepatitis C antibody   QuantiFERON-TB Gold Plus   Serum protein electrophoresis with reflex   IgG, IgA, IgM   Glucose 6 phosphate dehydrogenase   No orders of the defined types were placed in this encounter.   Face-to-face time spent with patient was 60 minutes. Greater than 50% of time was spent in counseling and coordination of care.  Follow-Up Instructions: Return for Osteoarthritis, inflammatory arthritis .   Bo Merino, MD  Note - This record has been created using Editor, commissioning.  Chart creation errors have been sought, but may not always  have been located. Such creation errors do not reflect on  the standard of medical care.

## 2019-01-18 ENCOUNTER — Ambulatory Visit: Payer: Medicare Other | Admitting: Neurology

## 2019-01-25 ENCOUNTER — Ambulatory Visit (INDEPENDENT_AMBULATORY_CARE_PROVIDER_SITE_OTHER): Payer: Medicare Other

## 2019-01-25 DIAGNOSIS — Z95810 Presence of automatic (implantable) cardiac defibrillator: Secondary | ICD-10-CM

## 2019-01-25 DIAGNOSIS — I5022 Chronic systolic (congestive) heart failure: Secondary | ICD-10-CM

## 2019-01-25 NOTE — Progress Notes (Signed)
EPIC Encounter for ICM Monitoring  Patient Name: Veronica Shaw is a 68 y.o. female Date: 01/25/2019 Primary Care Physican: Caroline More, DO Primary Cardiologist:Cooper/Weaver PA Electrophysiologist: Lovena Le LastWeight: 135lbs 12/10/2018 Weight:136lbs 01/25/2019 137 lbs  Spoke with patient and heart failure questions reviewed.  She reports has some swelling in ankles, weight gain of 5 lbs (weight in May is the same as today) and generally not feeling well.  She has been taking PRN Furosemide 20 mg for the last few days.  She complains of swelling in her face when she is outside in the heat.  She will be evaluated for dementia on 7/14.   CAT scan has been done on back because she is having spine pain.   She does feel better today than she has all week.  She thinks she needs some lab work done.  She is seeing Rheumatologist on 7/1 and is hoping lab work will be drawn.   OptiVolThoracic impedancesuggesting possible ongoing fluid accumulation starting 01/10/2019.   Prescribed:Furosemide40 mgTake 0.5 tablets (20 mg total) by mouth daily as needed for fluid (For swelling).   Labs: 08/15/2018 Creatinine 1.26, BUN 16, Potassium 4.2, Sodium 141, GFR 44-50 05/24/2018 Cretainine 1.26, BUN 25, Potassium 4.1, Sodium 140, GFR 42-49 11/30/2017 Creatinine 1.45, BUN 22, Potassium 5.3, Sodium 140, GFR 37-43 10/19/2017 Creatinine1.16, BUN18, Potassium4.1, Sodium139, GFR47-55 10/18/2017 Creatinine1.24, BUN17, Potassium4.4, Sodium139, GFR44-51  10/17/2017 Creatinine1.46, BUN23, Potassium4.8, Sodium135, TMH96-22  10/15/2017 Creatinine1.61, BUN24, Potassium4.0, [id number], HER74-08  Recommendations: Advised to take PRN Furosemide through the weekend.  Offered to schedule office appointment with Dr Burt Knack or Richardson Dopp, PA but she declined at this time.  If she is not feeling better by Monday she will call the office for an appt.      Follow-up plan: ICM clinic phone appointment on6/30/2020 (manual send) to recheck fluid levels.  Copy of ICM check sent to Dr.Taylorand Richardson Dopp, PA.  3 month ICM trend: 01/25/2019    1 Year ICM trend:       [name], RN 01/25/2019 3:45 PM

## 2019-01-28 NOTE — Progress Notes (Signed)
Agree. Thanks! Richardson Dopp, PA-C    01/28/2019 2:06 PM

## 2019-01-29 ENCOUNTER — Telehealth: Payer: Self-pay | Admitting: Internal Medicine

## 2019-01-29 NOTE — Telephone Encounter (Signed)
   Patient is scheduled to have a myelogram on 02/04/19 and she wants to know if it is okay for her to have that done with her defibrillator. Please let her know.

## 2019-01-29 NOTE — Telephone Encounter (Signed)
Called [facility] @ [phone], a myelogram will not create a magnetic/electromagnetic field and will not interfere with pt defibrillator. Also reached out to [employer], the xray will not interfere with the device. The CT scan beam should not come into contact with the device, could damage device. If the beam is going to come into contact w/ the device, a magnet should be applied. Pt notified and has no further questions.

## 2019-01-30 ENCOUNTER — Other Ambulatory Visit: Payer: Self-pay

## 2019-01-30 ENCOUNTER — Ambulatory Visit (INDEPENDENT_AMBULATORY_CARE_PROVIDER_SITE_OTHER): Payer: Medicare Other

## 2019-01-30 ENCOUNTER — Ambulatory Visit: Payer: Self-pay

## 2019-01-30 ENCOUNTER — Encounter: Payer: Self-pay | Admitting: Rheumatology

## 2019-01-30 ENCOUNTER — Ambulatory Visit (INDEPENDENT_AMBULATORY_CARE_PROVIDER_SITE_OTHER): Payer: Medicare Other | Admitting: Rheumatology

## 2019-01-30 VITALS — BP 117/60 | HR 78 | Resp 13 | Ht 61.0 in | Wt 140.4 lb

## 2019-01-30 DIAGNOSIS — M25562 Pain in left knee: Secondary | ICD-10-CM | POA: Diagnosis not present

## 2019-01-30 DIAGNOSIS — M8580 Other specified disorders of bone density and structure, unspecified site: Secondary | ICD-10-CM | POA: Diagnosis not present

## 2019-01-30 DIAGNOSIS — M51369 Other intervertebral disc degeneration, lumbar region without mention of lumbar back pain or lower extremity pain: Secondary | ICD-10-CM

## 2019-01-30 DIAGNOSIS — M79671 Pain in right foot: Secondary | ICD-10-CM

## 2019-01-30 DIAGNOSIS — Z8639 Personal history of other endocrine, nutritional and metabolic disease: Secondary | ICD-10-CM

## 2019-01-30 DIAGNOSIS — M25511 Pain in right shoulder: Secondary | ICD-10-CM

## 2019-01-30 DIAGNOSIS — M79641 Pain in right hand: Secondary | ICD-10-CM | POA: Diagnosis not present

## 2019-01-30 DIAGNOSIS — G8929 Other chronic pain: Secondary | ICD-10-CM

## 2019-01-30 DIAGNOSIS — Z79899 Other long term (current) drug therapy: Secondary | ICD-10-CM | POA: Diagnosis not present

## 2019-01-30 DIAGNOSIS — G20A1 Parkinson's disease without dyskinesia, without mention of fluctuations: Secondary | ICD-10-CM

## 2019-01-30 DIAGNOSIS — M5136 Other intervertebral disc degeneration, lumbar region: Secondary | ICD-10-CM | POA: Diagnosis not present

## 2019-01-30 DIAGNOSIS — M79642 Pain in left hand: Secondary | ICD-10-CM | POA: Diagnosis not present

## 2019-01-30 DIAGNOSIS — E1142 Type 2 diabetes mellitus with diabetic polyneuropathy: Secondary | ICD-10-CM | POA: Diagnosis not present

## 2019-01-30 DIAGNOSIS — I5022 Chronic systolic (congestive) heart failure: Secondary | ICD-10-CM

## 2019-01-30 DIAGNOSIS — J441 Chronic obstructive pulmonary disease with (acute) exacerbation: Secondary | ICD-10-CM

## 2019-01-30 DIAGNOSIS — M25512 Pain in left shoulder: Secondary | ICD-10-CM | POA: Diagnosis not present

## 2019-01-30 DIAGNOSIS — Z8659 Personal history of other mental and behavioral disorders: Secondary | ICD-10-CM

## 2019-01-30 DIAGNOSIS — M21612 Bunion of left foot: Secondary | ICD-10-CM

## 2019-01-30 DIAGNOSIS — J45901 Unspecified asthma with (acute) exacerbation: Secondary | ICD-10-CM

## 2019-01-30 DIAGNOSIS — R300 Dysuria: Secondary | ICD-10-CM

## 2019-01-30 DIAGNOSIS — L659 Nonscarring hair loss, unspecified: Secondary | ICD-10-CM

## 2019-01-30 DIAGNOSIS — R4189 Other symptoms and signs involving cognitive functions and awareness: Secondary | ICD-10-CM

## 2019-01-30 DIAGNOSIS — M25561 Pain in right knee: Secondary | ICD-10-CM | POA: Diagnosis not present

## 2019-01-30 DIAGNOSIS — M79672 Pain in left foot: Secondary | ICD-10-CM | POA: Diagnosis not present

## 2019-01-30 DIAGNOSIS — E118 Type 2 diabetes mellitus with unspecified complications: Secondary | ICD-10-CM

## 2019-01-30 DIAGNOSIS — M5030 Other cervical disc degeneration, unspecified cervical region: Secondary | ICD-10-CM | POA: Diagnosis not present

## 2019-01-30 DIAGNOSIS — I428 Other cardiomyopathies: Secondary | ICD-10-CM

## 2019-01-30 DIAGNOSIS — G20 Parkinson's disease: Secondary | ICD-10-CM

## 2019-01-30 DIAGNOSIS — L508 Other urticaria: Secondary | ICD-10-CM

## 2019-01-30 DIAGNOSIS — I10 Essential (primary) hypertension: Secondary | ICD-10-CM

## 2019-01-30 NOTE — Progress Notes (Signed)
No ICM remote transmission received for 01/28/2019 and next ICM transmission scheduled for 02/19/2019.

## 2019-02-04 ENCOUNTER — Ambulatory Visit
Admission: RE | Admit: 2019-02-04 | Discharge: 2019-02-04 | Disposition: A | Discharge status: Home / Self Care | Payer: Medicare Other | Source: Ambulatory Visit | Attending: Physician Assistant | Admitting: Physician Assistant

## 2019-02-04 ENCOUNTER — Telehealth: Payer: Self-pay | Admitting: Nurse Practitioner

## 2019-02-04 ENCOUNTER — Other Ambulatory Visit: Payer: Self-pay

## 2019-02-04 DIAGNOSIS — M542 Cervicalgia: Secondary | ICD-10-CM | POA: Diagnosis not present

## 2019-02-04 DIAGNOSIS — G8929 Other chronic pain: Secondary | ICD-10-CM | POA: Diagnosis not present

## 2019-02-04 DIAGNOSIS — M5412 Radiculopathy, cervical region: Secondary | ICD-10-CM

## 2019-02-04 DIAGNOSIS — M4802 Spinal stenosis, cervical region: Secondary | ICD-10-CM | POA: Diagnosis not present

## 2019-02-04 MED ORDER — DIAZEPAM 5 MG PO TABS
5.0000 mg | ORAL_TABLET | Freq: Once | ORAL | Status: AC
  Administered 2019-02-04: 5 mg via ORAL

## 2019-02-04 MED ORDER — IOPAMIDOL (ISOVUE-M 300) INJECTION 61%
10.0000 mL | Freq: Once | INTRAMUSCULAR | Status: AC
  Administered 2019-02-04: 10 mL via INTRATHECAL

## 2019-02-04 NOTE — Discharge Instructions (Signed)

## 2019-02-04 NOTE — Telephone Encounter (Signed)
Phone call from pt c/o severe leg pain "from ankle to hip" that started after discharge from myelogram procedure today. Pt reports hx of leg pain but states "this pain is different." Pt also reports "my foot is turned up and I can't move it." Pt denies numbness, tingling or weakness. Pt states she has not taken anything for pain and usually takes gabapentin. Dr. Nelia Shi made aware and to call pt back.

## 2019-02-06 DIAGNOSIS — M5412 Radiculopathy, cervical region: Secondary | ICD-10-CM | POA: Diagnosis not present

## 2019-02-06 DIAGNOSIS — M47812 Spondylosis without myelopathy or radiculopathy, cervical region: Secondary | ICD-10-CM | POA: Diagnosis not present

## 2019-02-06 LAB — PROTEIN ELECTROPHORESIS, SERUM, WITH REFLEX
Albumin ELP: 4.1 g/dL (ref 3.8–4.8)
Alpha 1: 0.3 g/dL (ref 0.2–0.3)
Alpha 2: 0.9 g/dL (ref 0.5–0.9)
Beta 2: 0.4 g/dL (ref 0.2–0.5)
Beta Globulin: 0.5 g/dL (ref 0.4–0.6)
Gamma Globulin: 0.8 g/dL (ref 0.8–1.7)
Total Protein: 7 g/dL (ref 6.1–8.1)

## 2019-02-06 LAB — CBC WITH DIFFERENTIAL/PLATELET
Absolute Monocytes: 845 cells/uL (ref 200–950)
Basophils Absolute: 29 cells/uL (ref 0–200)
Basophils Relative: 0.3 %
Eosinophils Absolute: 269 cells/uL (ref 15–500)
Eosinophils Relative: 2.8 %
HCT: 39.7 % (ref 35.0–45.0)
Hemoglobin: 13.3 g/dL (ref 11.7–15.5)
Lymphs Abs: 1210 cells/uL (ref 850–3900)
MCH: 30.1 pg (ref 27.0–33.0)
MCHC: 33.5 g/dL (ref 32.0–36.0)
MCV: 89.8 fL (ref 80.0–100.0)
MPV: 11.9 fL (ref 7.5–12.5)
Monocytes Relative: 8.8 %
Neutro Abs: 7248 cells/uL (ref 1500–7800)
Neutrophils Relative %: 75.5 %
Platelets: 247 10*3/uL (ref 140–400)
RBC: 4.42 10*6/uL (ref 3.80–5.10)
RDW: 12.3 % (ref 11.0–15.0)
Total Lymphocyte: 12.6 %
WBC: 9.6 10*3/uL (ref 3.8–10.8)

## 2019-02-06 LAB — CYCLIC CITRUL PEPTIDE ANTIBODY, IGG: Cyclic Citrullin Peptide Ab: 36 UNITS — ABNORMAL HIGH

## 2019-02-06 LAB — COMPLETE METABOLIC PANEL WITH GFR
AG Ratio: 1.8 (calc) (ref 1.0–2.5)
ALT: 6 U/L (ref 6–29)
AST: 18 U/L (ref 10–35)
Albumin: 4.5 g/dL (ref 3.6–5.1)
Alkaline phosphatase (APISO): 105 U/L (ref 37–153)
BUN/Creatinine Ratio: 15 (calc) (ref 6–22)
BUN: 22 mg/dL (ref 7–25)
CO2: 28 mmol/L (ref 20–32)
Calcium: 10.5 mg/dL — ABNORMAL HIGH (ref 8.6–10.4)
Chloride: 103 mmol/L (ref 98–110)
Creat: 1.44 mg/dL — ABNORMAL HIGH (ref 0.50–0.99)
GFR, Est African American: 43 mL/min/{1.73_m2} — ABNORMAL LOW (ref >=60)
GFR, Est Non African American: 37 mL/min/{1.73_m2} — ABNORMAL LOW (ref >=60)
Globulin: 2.5 g/dL (calc) (ref 1.9–3.7)
Glucose, Bld: 139 mg/dL — ABNORMAL HIGH (ref 65–99)
Potassium: 4.5 mmol/L (ref 3.5–5.3)
Sodium: 139 mmol/L (ref 135–146)
Total Bilirubin: 0.4 mg/dL (ref 0.2–1.2)
Total Protein: 7 g/dL (ref 6.1–8.1)

## 2019-02-06 LAB — HEPATITIS C ANTIBODY
Hepatitis C Ab: NONREACTIVE
SIGNAL TO CUT-OFF: 0.01 (ref <1.00)

## 2019-02-06 LAB — HEPATITIS B SURFACE ANTIGEN: Hepatitis B Surface Ag: NONREACTIVE

## 2019-02-06 LAB — QUANTIFERON-TB GOLD PLUS
Mitogen-NIL: 7.44 IU/mL
NIL: 0.01 IU/mL
QuantiFERON-TB Gold Plus: NEGATIVE
TB1-NIL: 0.01 IU/mL
TB2-NIL: 0.01 IU/mL

## 2019-02-06 LAB — URINALYSIS, ROUTINE W REFLEX MICROSCOPIC
Bacteria, UA: NONE SEEN /HPF
Bilirubin Urine: NEGATIVE
Glucose, UA: NEGATIVE
Hyaline Cast: NONE SEEN /LPF
Ketones, ur: NEGATIVE
Nitrite: NEGATIVE
Specific Gravity, Urine: 1.013 (ref 1.001–1.03)
WBC, UA: >=60 /HPF — AB (ref 0–5)
pH: 6 (ref 5.0–8.0)

## 2019-02-06 LAB — ANA: Anti Nuclear Antibody (ANA): NEGATIVE

## 2019-02-06 LAB — HEPATITIS B CORE ANTIBODY, IGM: Hep B C IgM: NONREACTIVE

## 2019-02-06 LAB — URIC ACID: Uric Acid, Serum: 5.9 mg/dL (ref 2.5–7.0)

## 2019-02-06 LAB — SEDIMENTATION RATE: Sed Rate: 17 mm/h (ref 0–30)

## 2019-02-06 LAB — IGG, IGA, IGM
IgG (Immunoglobin G), Serum: 1031 mg/dL (ref 600–1540)
IgM, Serum: 55 mg/dL (ref 50–300)
Immunoglobulin A: 190 mg/dL (ref 70–320)

## 2019-02-06 LAB — 14-3-3 ETA PROTEIN: 14-3-3 eta Protein: <0.2 ng/mL (ref <0.2)

## 2019-02-06 LAB — GLUCOSE 6 PHOSPHATE DEHYDROGENASE: G-6PDH: 17.4 U/g Hgb (ref 7.0–20.5)

## 2019-02-06 LAB — RHEUMATOID FACTOR: Rheumatoid fact SerPl-aCnc: <14 IU/mL (ref <14)

## 2019-02-07 ENCOUNTER — Other Ambulatory Visit: Payer: Self-pay | Admitting: Pharmacist

## 2019-02-07 NOTE — Progress Notes (Signed)
Labs are consistent with rheumatoid arthritis.  I will discuss labs at the follow-up visit.

## 2019-02-07 NOTE — Patient Outreach (Signed)
[facility]) Care Management  02/07/2019  Veronica Shaw 1950-12-24 [id number]  Patient was called to follow up on medication assistance with [facility].  HIPAA identifiers were obtained.  Patient confirmed understanding how to reorder [facility].   Medications were reviewed:  Medications Reviewed Today    Reviewed by Elayne Guerin, [facility] (Pharmacist) on 02/07/19 at [place] List Status: <None>  Medication Order Taking? Sig Documenting Provider Last Dose Status Informant  albuterol (PROVENTIL HFA;VENTOLIN HFA) 108 (90 Base) MCG/ACT inhaler [id number] Yes Inhale 2 puffs into the lungs every 4 (four) hours as needed for wheezing or shortness of breath. Rogue Bussing, MD Taking Active Self  Biotin 5000 MCG CAPS [id number] Yes Take 1 tablet by mouth daily. [provider] Taking Active   Calcium Carb-Cholecalciferol 600-800 MG-UNIT TABS [id number] Yes Take 1 tablet by mouth daily. Rogue Bussing, MD Taking Active Self           Med Note Allene Dillon Aug 15, 2018  9:38 AM)    carbidopa-levodopa (SINEMET IR) 25-100 MG tablet [id number] Yes TAKE 1 TABLET BY MOUTH THREE TIMES DAILY Tat, Eustace Quail, DO Taking Active            Med Note Regino Bellow Feb 07, 2019  9:31 AM) Patient reports only taking twice a day  famotidine (PEPCID) 20 MG tablet [id number] Yes TAKE 1 TABLET (20 MG TOTAL) BY MOUTH 2 (TWO) TIMES DAILY. Caroline More, DO Taking Active   fluticasone (FLONASE) 50 MCG/ACT nasal spray [id number] Yes Place 2 sprays into both nostrils daily. McDiarmid, [name], MD Taking Active   furosemide (LASIX) 40 MG tablet [id number] Yes Take 0.5 tablets (20 mg total) by mouth daily as needed for fluid (For swelling). Rogue Bussing, MD Taking Active Self           Med Note Olena Heckle, MICHELE   Wed Aug 15, 2018  9:38 AM)    gabapentin (NEURONTIN) 100 MG capsule [id number] Yes Take 300mg  (3 pills) at night before bed and 100mg  (1 pill) in the morning  with breakfast.  Patient taking differently: as needed. Take 300mg  (3 pills) at night before bed and 100mg  (1 pill) in the morning with breakfast.   Benay Pike, MD Taking Active   levalbuterol Penne Lash) 0.63 MG/3ML nebulizer solution [id number] Yes 1 vial every 4-6 hours as needed for wheezing and shortness of breath. Magdalen Spatz, NP Taking Active   losartan (COZAAR) 25 MG tablet [id number] Yes TAKE 1 TABLET EVERY Lacie Scotts, MD Taking Active   meclizine (ANTIVERT) 12.5 MG tablet [id number] Yes Take 0.5 tablets (6.25 mg total) by mouth 3 (three) times daily as needed for dizziness. McDiarmid, [name], MD Taking Active   metFORMIN (GLUCOPHAGE) 500 MG tablet [id number] Yes Take 1,000 mg by mouth 2 (two) times daily with a meal. [provider] Taking Active   methocarbamol (ROBAXIN) 750 MG tablet [id number] Yes Take 750 mg by mouth 3 (three) times daily. [provider] Taking Active   metoprolol succinate (TOPROL-XL) 50 MG 24 hr tablet [id number] Yes Take 1 tablet (50 mg total) by mouth 2 (two) times daily. [name], MD Taking Active   mometasone-formoterol [facility]) 200-5 MCG/ACT Hollie Salk [id number] Yes Inhale 2 puffs into the lungs 2 (two) times daily. Lauraine Rinne, NP Taking Active   potassium chloride (K-DUR,KLOR-CON) 10 MEQ tablet [id number] Yes Take 1 tablet (10 mEq total) by mouth as needed (take  with lasix as needed for swelling). Caroline More, DO Taking Active   spironolactone (ALDACTONE) 25 MG tablet [id number] Yes TAKE 1/2 TABLET EVERY Lacie Scotts, MD Taking Active   Turmeric 500 MG TABS [id number] Yes Take 1 tablet by mouth daily. [provider] Taking Active           Medication Review Findings:  Patient has diabetes but is not on a statin. She has an allergy to Simvastatin on her allergy list stating it caused muscle aches.  Patient is currently seeing a neurologist about her pain and recently had an in office procedure on her  cervical spine.  Plan: Follow up with patient in 4 months.  Elayne Guerin, PharmD, [facility] Clinical Pharmacist [phone]

## 2019-02-08 ENCOUNTER — Other Ambulatory Visit: Payer: Self-pay | Admitting: Internal Medicine

## 2019-02-12 DIAGNOSIS — R413 Other amnesia: Secondary | ICD-10-CM | POA: Diagnosis not present

## 2019-02-13 DIAGNOSIS — Z6825 Body mass index (BMI) 25.0-25.9, adult: Secondary | ICD-10-CM | POA: Diagnosis not present

## 2019-02-13 DIAGNOSIS — I10 Essential (primary) hypertension: Secondary | ICD-10-CM | POA: Diagnosis not present

## 2019-02-13 DIAGNOSIS — M47812 Spondylosis without myelopathy or radiculopathy, cervical region: Secondary | ICD-10-CM | POA: Diagnosis not present

## 2019-02-13 DIAGNOSIS — R413 Other amnesia: Secondary | ICD-10-CM | POA: Diagnosis not present

## 2019-02-19 ENCOUNTER — Ambulatory Visit (INDEPENDENT_AMBULATORY_CARE_PROVIDER_SITE_OTHER): Payer: Medicare Other

## 2019-02-19 DIAGNOSIS — Z95810 Presence of automatic (implantable) cardiac defibrillator: Secondary | ICD-10-CM | POA: Diagnosis not present

## 2019-02-19 DIAGNOSIS — I5022 Chronic systolic (congestive) heart failure: Secondary | ICD-10-CM

## 2019-02-22 NOTE — Progress Notes (Signed)
EPIC Encounter for ICM Monitoring  Patient Name: Veronica Shaw is a 68 y.o. female Date: 02/22/2019 Primary Care Physican: Caroline More, DO Primary Cardiologist:Cooper/Weaver PA Electrophysiologist: Lovena Le 01/25/2019 Weight: 137 lbs 02/22/2019 Weight: 137 lbs  Spoke with patient and heart failure questions reviewed.  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  OptiVolThoracic impedancesuggesting possible ongoing fluid accumulation but is trending back to baseline today.  Prescribed:Furosemide40 mgTake 0.5 tablets (20 mg total) by mouth daily as needed for fluid (For swelling).   Labs: 01/30/2019 Creatinine 1.44, BUN 22, Potassium 4.5, Sodium 139, GFR 37-43 A complete set of results can be found in Results Review.  Recommendations: She said she took PRN fluid pill today and tomorrow.   Follow-up plan: ICM clinic phone appointment on8/25/2020.  Copy of ICM check sent to Dr.Taylor.  3 month ICM trend: 02/19/2019    1 Year ICM trend:       [name], RN 02/22/2019 3:56 PM

## 2019-02-26 DIAGNOSIS — F3289 Other specified depressive episodes: Secondary | ICD-10-CM | POA: Diagnosis not present

## 2019-02-26 DIAGNOSIS — G20 Parkinson's disease: Secondary | ICD-10-CM | POA: Diagnosis not present

## 2019-02-26 DIAGNOSIS — F418 Other specified anxiety disorders: Secondary | ICD-10-CM | POA: Diagnosis not present

## 2019-02-26 DIAGNOSIS — F0280 Dementia in other diseases classified elsewhere without behavioral disturbance: Secondary | ICD-10-CM | POA: Diagnosis not present

## 2019-02-28 ENCOUNTER — Ambulatory Visit (INDEPENDENT_AMBULATORY_CARE_PROVIDER_SITE_OTHER): Payer: Medicare Other | Admitting: *Deleted

## 2019-02-28 DIAGNOSIS — I428 Other cardiomyopathies: Secondary | ICD-10-CM

## 2019-02-28 LAB — CUP PACEART REMOTE DEVICE CHECK
Battery Remaining Longevity: 52 mo
Battery Voltage: 2.97 V
Brady Statistic AP VP Percent: 0 %
Brady Statistic AP VS Percent: 0.08 %
Brady Statistic AS VP Percent: 0.03 %
Brady Statistic AS VS Percent: 99.89 %
Brady Statistic RA Percent Paced: 0.08 %
Brady Statistic RV Percent Paced: 0.03 %
Date Time Interrogation Session: 20200730103425
HighPow Impedance: 70 Ohm
Implantable Lead Implant Date: 20150427
Implantable Lead Implant Date: 20150427
Implantable Lead Location: 753859
Implantable Lead Location: 753860
Implantable Lead Model: 5076
Implantable Lead Model: 6935
Implantable Pulse Generator Implant Date: 20150427
Lead Channel Impedance Value: 361 Ohm
Lead Channel Impedance Value: 4047 Ohm
Lead Channel Impedance Value: 4047 Ohm
Lead Channel Impedance Value: 4047 Ohm
Lead Channel Impedance Value: 532 Ohm
Lead Channel Impedance Value: 646 Ohm
Lead Channel Pacing Threshold Amplitude: 0.5 V
Lead Channel Pacing Threshold Amplitude: 0.625 V
Lead Channel Pacing Threshold Pulse Width: 0.4 ms
Lead Channel Pacing Threshold Pulse Width: 0.4 ms
Lead Channel Sensing Intrinsic Amplitude: 10.875 mV
Lead Channel Sensing Intrinsic Amplitude: 2.25 mV
Lead Channel Setting Pacing Amplitude: 2 V
Lead Channel Setting Pacing Amplitude: 2.5 V
Lead Channel Setting Pacing Pulse Width: 0.4 ms
Lead Channel Setting Sensing Sensitivity: 0.3 mV

## 2019-02-28 NOTE — Progress Notes (Deleted)
   Subjective:    Patient ID: Veronica Shaw, female    DOB: 10-13-1950, 68 y.o.   MRN: [id number]   CC:  HPI: Diabetes Fasting checks: *** Post prandial ***  Compliance: *** Diet: ***  Exercise: *** Eye exam: *** Foot exam: *** A1C: *** Symptoms: *** symptoms of hypoglycemia. *** symptoms of  polyuria, polydipsia. *** numbness in extremities, and *** foot ulcers/trauma Meds: metformin 1000mg  bid  Pneumonia vaccine: *** Urine micro albumin:creatine ratio: ***  Monitoring Labs and Parameters Last A1C:  Lab Results  Component Value Date   HGBA1C 7.1 (A) 09/24/2018   Last Lipid:     Component Value Date/Time   CHOL 172 05/31/2013 0435   HDL 49 05/31/2013 0435   LDLDIRECT 111 (H) 02/10/2015 1145   Last Bmet  Potassium  Date Value Ref Range Status  01/30/2019 4.5 3.5 - 5.3 mmol/L Final   Sodium  Date Value Ref Range Status  01/30/2019 139 135 - 146 mmol/L Final  08/15/2018 141 134 - 144 mmol/L Final   Creat  Date Value Ref Range Status  01/30/2019 1.44 (H) 0.50 - 0.99 mg/dL Final    Comment:    For patients >31 years of age, the reference limit for Creatinine is approximately 13% higher for people identified as African-American. .        Smoking status reviewed  Review of Systems   Objective:  LMP 08/01/2000 (Approximate)  Vitals and nursing note reviewed  General: well nourished, in no acute distress HEENT: normocephalic, TM's visualized bilaterally, no scleral icterus or conjunctival pallor, no nasal discharge, moist mucous membranes, good dentition without erythema or discharge noted in posterior oropharynx Neck: supple, non-tender, without lymphadenopathy Cardiac: RRR, clear S1 and S2, no murmurs, rubs, or gallops Respiratory: clear to auscultation bilaterally, no increased work of breathing Abdomen: soft, nontender, nondistended, no masses or organomegaly. Bowel sounds present Extremities: no edema or cyanosis. Warm, well perfused. 2+ radial and PT  pulses bilaterally Skin: warm and dry, no rashes noted Neuro: alert and oriented, no focal deficits   Assessment & Plan:    No problem-specific Assessment & Plan notes found for this encounter.    No follow-ups on file.   Caroline More, DO, PGY-3

## 2019-03-01 ENCOUNTER — Ambulatory Visit: Payer: Medicare Other | Admitting: Family Medicine

## 2019-03-04 ENCOUNTER — Telehealth: Payer: Self-pay | Admitting: Neurology

## 2019-03-04 NOTE — Progress Notes (Signed)
Subjective:    Patient ID: Veronica Shaw, female    DOB: 1950/11/10, 68 y.o.   MRN: [id number]   CC: diabetes follow up  HPI: Diabetes Fasting checks: 150-185 Post prandial 200s  Compliance: good Diet: rice 3x/week, decreased sweets, decreased sodas  Exercise: more then ever, granddaughter's boyfriend is a PT and is working with her Eye exam: November 2019, going next week. 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 no symptoms of  polyuria, polydipsia. no numbness in extremities, and no foot ulcers/trauma Meds: metformin 1000 mg BID Pneumonia vaccine: needs, will administer today  Urine micro albumin:creatine ratio: on an ARB  Monitoring Labs and Parameters Last A1C:  Lab Results  Component Value Date   HGBA1C 6.6 03/05/2019   Last Lipid:     Component Value Date/Time   CHOL 172 05/31/2013 0435   HDL 49 05/31/2013 0435   LDLDIRECT 111 (H) 02/10/2015 1145   Last Bmet  Potassium  Date Value Ref Range Status  01/30/2019 4.5 3.5 - 5.3 mmol/L Final   Sodium  Date Value Ref Range Status  01/30/2019 139 135 - 146 mmol/L Final  08/15/2018 141 134 - 144 mmol/L Final   Creat  Date Value Ref Range Status  01/30/2019 1.44 (H) 0.50 - 0.99 mg/dL Final    Comment:    For patients >3 years of age, the reference limit for Creatinine is approximately 13% higher for people identified as African-American. .       Hypertension: - Medications: losartan 25mg  daily, torpil xl 50mg  daily, spironolactone 12.5mg  daily  - Compliance: good - Checking BP at home: checks 3-4x/week (usually 130/70s) - Denies any SOB, CP, vision changes, LE edema, medication SEs, or symptoms of hypotension - Diet: see above - Exercise: see above   Hyperlipidemia Meds: none, previously intolerant to simvastatin. H/o T2DM Diet: see above  Exercise: see above     Objective:  BP 120/62   Pulse 87   Wt 136 lb 6.4 oz  (61.9 kg)   LMP 08/01/2000 (Approximate)   SpO2 97%   BMI 25.77 kg/m  Vitals and nursing note reviewed  General: well nourished, in no acute distress HEENT: normocephalic, TM's visualized bilaterally, no scleral icterus or conjunctival pallor  Neck: supple  Cardiac: RRR, clear S1 and S2, no murmurs, rubs, or gallops Respiratory: clear to auscultation bilaterally, no increased work of breathing Abdomen: soft, nontender, nondistended, no masses or organomegaly. Bowel sounds present Extremities: no edema or cyanosis. Warm, well perfused.   Skin: warm and dry, no rashes noted Neuro: alert and oriented, no focal deficits   Assessment & Plan:    Type II diabetes mellitus with complication Very well with A1c of 6.6.  Discussed that she is within goal and possibly to well controlled.  Patient does not want to change medications at this time but can consider decreasing metformin in the future.  Advised to continue healthy diet and daily exercise.  Advised that his sugars are low at home to please call and at that time we can consider decreasing medications.  Continue metformin 1000 mg twice daily.  Strict return precautions given.  Follow-up in 3 months.  Essential hypertension Well-controlled at home.  Borderline low today but patient states her blood pressures are as high as 063K systolic at home.  Asymptomatic.  Will not change medications at this time as patient is also closely followed with cardiology and nephrology.  Strict  return precautions given.  Follow-up in 3 months.  Hyperlipidemia associated with type 2 diabetes mellitus ([place]) History of type 2 diabetes but not on any statin therapy.  Appears to have been tolerated simvastatin in the past.  Discussed this with patient and importance of taking a statin while having a history of diabetes and hypertension.  Will obtain fasting lipid panel today.  Will likely need to start a medication.  Encourage healthy diet and daily exercise.   Contact dermatitis As patient was leaving she did mention that she has a new rash around her neckline.  Appears to be in the pattern of a necklace.  It is very itchy and red.  Does appear to look like contact dermatitis.  Will prescribe triamcinolone ointment.  Strict return precautions given.  Follow-up if no improvement.    Return in about 3 months (around 06/05/2019).   Caroline More, DO, PGY-3

## 2019-03-04 NOTE — Telephone Encounter (Signed)
Received neurocognitive testing from [facility], from Dr. Cecelia Byars.  This was done via zoom on February 12, 2019.  Diagnosis was moderate level of anxiety and depression.  While she did state that the patient had evidence of dementia, she also noted that "the dementia diagnosis is based on the presence of functional difficulties."  She felt that the patient was demonstrating early stages of Parkinson's dementia, with additional exacerbating factors "including chronic pain, insomnia, and cardiovascular/cerebrovascular risk factors."  She stated that anxiety and depression also play a role in cognitive dysfunction.  She recommended that the patient be treated with an antidepressant, consider the Exelon patch, consider treatment for insomnia.  She also noted that the family members expressed concerns regarding the patient's driving, with the patient becoming easily lost and driving down the wrong side of the road.  Dr. Cecelia Byars recommended that the patient cut back on driving and refrain from driving whenever possible.  I recommended that she should limit driving to close and familiar destinations within 5 mile radius from the home and avoid nighttime driving.  She went over these results with the patient and family (granddaughter).

## 2019-03-05 ENCOUNTER — Ambulatory Visit (INDEPENDENT_AMBULATORY_CARE_PROVIDER_SITE_OTHER): Payer: Medicare Other | Admitting: Family Medicine

## 2019-03-05 ENCOUNTER — Other Ambulatory Visit: Payer: Self-pay

## 2019-03-05 ENCOUNTER — Encounter: Payer: Self-pay | Admitting: Family Medicine

## 2019-03-05 VITALS — BP 120/62 | HR 87 | Wt 136.4 lb

## 2019-03-05 DIAGNOSIS — E118 Type 2 diabetes mellitus with unspecified complications: Secondary | ICD-10-CM | POA: Diagnosis not present

## 2019-03-05 DIAGNOSIS — E1169 Type 2 diabetes mellitus with other specified complication: Secondary | ICD-10-CM | POA: Insufficient documentation

## 2019-03-05 DIAGNOSIS — E785 Hyperlipidemia, unspecified: Secondary | ICD-10-CM

## 2019-03-05 DIAGNOSIS — Z23 Encounter for immunization: Secondary | ICD-10-CM

## 2019-03-05 DIAGNOSIS — E119 Type 2 diabetes mellitus without complications: Secondary | ICD-10-CM

## 2019-03-05 DIAGNOSIS — L259 Unspecified contact dermatitis, unspecified cause: Secondary | ICD-10-CM

## 2019-03-05 DIAGNOSIS — I10 Essential (primary) hypertension: Secondary | ICD-10-CM

## 2019-03-05 HISTORY — DX: Type 2 diabetes mellitus with other specified complication: E11.69

## 2019-03-05 HISTORY — DX: Hyperlipidemia, unspecified: E78.5

## 2019-03-05 LAB — POCT GLYCOSYLATED HEMOGLOBIN (HGB A1C): HbA1c, POC (controlled diabetic range): 6.6 % (ref 0.0–7.0)

## 2019-03-05 MED ORDER — TRIAMCINOLONE ACETONIDE 0.1 % EX OINT: 1.0000 "application " | TOPICAL_OINTMENT | Freq: Two times a day (BID) | CUTANEOUS | 1 refills | Status: DC

## 2019-03-05 NOTE — Patient Instructions (Addendum)
  Diet Recommendations for Diabetes   Starchy (carb) foods: Bread, rice, pasta, potatoes, corn, cereal, grits, crackers, bagels, muffins, all baked goods.  (Fruits, milk, and yogurt also have carbohydrate, but most of these foods will not spike your blood sugar as the starchy foods will.)  A few fruits do cause high blood sugars; use small portions of bananas (limit to 1/2 at a time), grapes, watermelon, oranges, and most tropical fruits.    Protein foods: Meat, fish, poultry, eggs, dairy foods, and beans such as pinto and kidney beans (beans also provide carbohydrate).   1. Eat at least 3 meals and 1-2 snacks per day. Never go more than 4-5 hours while awake without eating. Eat breakfast within the first hour of getting up.   2. Limit starchy foods to TWO per meal and ONE per snack. ONE portion of a starchy  food is equal to the following:   - ONE slice of bread (or its equivalent, such as half of a hamburger bun).   - 1/2 cup of a "scoopable" starchy food such as potatoes or rice.   - 15 grams of carbohydrate as shown on food label.  3. Include at every meal: a protein food, a carb food, and vegetables and/or fruit.   - Obtain twice the volume of veg's as protein or carbohydrate foods for both lunch and dinner.   - Fresh or frozen veg's are best.   - Keep frozen veg's on hand for a quick vegetable serving.      If your fasting sugars are <120 or during the day sugars <150 please give [place] a call. You may not need as much medicine anymore

## 2019-03-05 NOTE — Assessment & Plan Note (Signed)
As patient was leaving she did mention that she has a new rash around her neckline.  Appears to be in the pattern of a necklace.  It is very itchy and red.  Does appear to look like contact dermatitis.  Will prescribe triamcinolone ointment.  Strict return precautions given.  Follow-up if no improvement.

## 2019-03-05 NOTE — Assessment & Plan Note (Signed)
Well-controlled at home.  Borderline low today but patient states her blood pressures are as high as 179X systolic at home.  Asymptomatic.  Will not change medications at this time as patient is also closely followed with cardiology and nephrology.  Strict return precautions given.  Follow-up in 3 months.

## 2019-03-05 NOTE — Assessment & Plan Note (Signed)
History of type 2 diabetes but not on any statin therapy.  Appears to have been tolerated simvastatin in the past.  Discussed this with patient and importance of taking a statin while having a history of diabetes and hypertension.  Will obtain fasting lipid panel today.  Will likely need to start a medication.  Encourage healthy diet and daily exercise.

## 2019-03-05 NOTE — Assessment & Plan Note (Addendum)
Very well with A1c of 6.6.  Discussed that she is within goal and possibly to well controlled.  Patient does not want to change medications at this time but can consider decreasing metformin in the future.  Advised to continue healthy diet and daily exercise.  Advised that his sugars are low at home to please call and at that time we can consider decreasing medications.  Continue metformin 1000 mg twice daily.  Strict return precautions given.  Follow-up in 3 months.

## 2019-03-06 ENCOUNTER — Other Ambulatory Visit: Payer: Self-pay | Admitting: Family Medicine

## 2019-03-06 ENCOUNTER — Encounter: Payer: Self-pay | Admitting: Family Medicine

## 2019-03-06 LAB — BASIC METABOLIC PANEL
BUN/Creatinine Ratio: 18 (ref 12–28)
BUN: 24 mg/dL (ref 8–27)
CO2: 23 mmol/L (ref 20–29)
Calcium: 10.2 mg/dL (ref 8.7–10.3)
Chloride: 104 mmol/L (ref 96–106)
Creatinine, Ser: 1.31 mg/dL — ABNORMAL HIGH (ref 0.57–1.00)
GFR calc Af Amer: 48 mL/min/{1.73_m2} — ABNORMAL LOW (ref >59)
GFR calc non Af Amer: 41 mL/min/{1.73_m2} — ABNORMAL LOW (ref >59)
Glucose: 165 mg/dL — ABNORMAL HIGH (ref 65–99)
Potassium: 4.9 mmol/L (ref 3.5–5.2)
Sodium: 144 mmol/L (ref 134–144)

## 2019-03-06 LAB — LIPID PANEL
Chol/HDL Ratio: 4.2 ratio (ref 0.0–4.4)
Cholesterol, Total: 182 mg/dL (ref 100–199)
HDL: 43 mg/dL (ref >39)
LDL Calculated: 110 mg/dL — ABNORMAL HIGH (ref 0–99)
Triglycerides: 144 mg/dL (ref 0–149)
VLDL Cholesterol Cal: 29 mg/dL (ref 5–40)

## 2019-03-06 MED ORDER — PRAVASTATIN SODIUM 20 MG PO TABS: 20.0000 mg | ORAL_TABLET | Freq: Every day | ORAL | 0 refills | Status: DC

## 2019-03-07 ENCOUNTER — Other Ambulatory Visit: Payer: Self-pay

## 2019-03-07 NOTE — Progress Notes (Signed)
Virtual Visit via Phone Note (multiple attempts via video without success) The purpose of this virtual visit is to provide medical care while limiting exposure to the novel coronavirus.    Consent was obtained for phone visit:  Yes.   Answered questions that patient had about phone interaction:  Yes.   I discussed the limitations, risks, security and privacy concerns of performing an evaluation and management service by telemedicine. I also discussed with the patient that there may be a patient responsible charge related to this service. The patient expressed understanding and agreed to proceed.  Pt location: Home Physician Location: office Name of referring provider:  Caroline More, DO I connected with Veronica Shaw at patients initiation/request on 03/08/2019 at  9:45 AM EDT by video enabled telemedicine application and verified that I am speaking with the correct person using two identifiers. Pt MRN:  [id number] Pt DOB:  Apr 08, 1951 Video Participants:  Veronica Shaw;     History of Present Illness:  Patient seen in follow-up for Parkinson's disease.  She is seen much earlier than expected, as I just saw her less than 8 weeks ago and she was scheduled for follow-up not until December.  She is supposed to be on carbidopa/levodopa 25/100, 1 tablet 3 times per day (states that she is generally taking 3 times per day).  She had neurocognitive testing done on February 12, 2019 with Dr. Cecelia Byars.  This was done through a tele-visit encounter and was not in person.  The diagnosis was moderate level of anxiety and depression.  The psychologist did state that the patient had evidence of dementia, but also noted that "the dementia diagnosis is based on the presence of functional difficulties."  She did feel that the patient was demonstrating possible early signs of Parkinson's dementia, with additional exacerbating factors "including chronic pain, insomnia, and cardiovascular/cerebrovascular risk factors."   She noted that anxiety and depression were playing a role in the patient's cognitive dysfunction.  She mentioned trials of antidepressants, Exelon patch and treatment for insomnia.  She also noted that the patient's family members expressed concerns regarding the patient's driving and Dr. Cecelia Byars recommended that the patient cut back on driving and refrain from driving when possible.  She recommended that she should limit driving to close and familiar destinations within 5 mile radius from the home and avoid nighttime driving.     Current Outpatient Medications on File Prior to Visit  Medication Sig Dispense Refill   albuterol (PROVENTIL HFA;VENTOLIN HFA) 108 (90 Base) MCG/ACT inhaler Inhale 2 puffs into the lungs every 4 (four) hours as needed for wheezing or shortness of breath. 18 g 3   Biotin 5000 MCG CAPS Take 1 tablet by mouth daily.     Calcium Carb-Cholecalciferol 600-800 MG-UNIT TABS Take 1 tablet by mouth daily. 90 tablet 3   carbidopa-levodopa (SINEMET IR) 25-100 MG tablet TAKE 1 TABLET BY MOUTH THREE TIMES DAILY 270 tablet 1   famotidine (PEPCID) 20 MG tablet TAKE 1 TABLET (20 MG TOTAL) BY MOUTH 2 (TWO) TIMES DAILY. 120 tablet 1   fluticasone (FLONASE) 50 MCG/ACT nasal spray Place 2 sprays into both nostrils daily. 16 g 6   furosemide (LASIX) 40 MG tablet TAKE 1/2 TABLET EVERY DAY AS NEEDED FOR FLUID/SWELLING 30 tablet 3   gabapentin (NEURONTIN) 100 MG capsule Take 300mg  (3 pills) at night before bed and 100mg  (1 pill) in the morning with breakfast. (Patient taking differently: as needed. Take 300mg  (3 pills) at night before bed and  100mg  (1 pill) in the morning with breakfast.) 120 capsule 1   levalbuterol (XOPENEX) 0.63 MG/3ML nebulizer solution 1 vial every 4-6 hours as needed for wheezing and shortness of breath. 180 mL 2   losartan (COZAAR) 25 MG tablet TAKE 1 TABLET EVERY DAY 90 tablet 2   metFORMIN (GLUCOPHAGE) 500 MG tablet Take 1,000 mg by mouth 2 (two) times daily with  a meal.     methocarbamol (ROBAXIN) 750 MG tablet Take 750 mg by mouth 3 (three) times daily.     metoprolol succinate (TOPROL-XL) 50 MG 24 hr tablet TAKE 1 TABLET TWICE DAILY 180 tablet 3   mometasone-formoterol (DULERA) 200-5 MCG/ACT AERO Inhale 2 puffs into the lungs 2 (two) times daily. 3 Inhaler 3   potassium chloride (K-DUR,KLOR-CON) 10 MEQ tablet Take 1 tablet (10 mEq total) by mouth as needed (take with lasix as needed for swelling). 30 tablet 2   pravastatin (PRAVACHOL) 20 MG tablet Take 1 tablet (20 mg total) by mouth at bedtime. 90 tablet 0   spironolactone (ALDACTONE) 25 MG tablet TAKE 1/2 TABLET EVERY DAY 45 tablet 2   triamcinolone ointment (KENALOG) 0.1 % Apply 1 application topically 2 (two) times daily. 15 g 1   Turmeric 500 MG TABS Take 1 tablet by mouth daily.     No current facility-administered medications on file prior to visit.      Observations/Objective:   Vitals:   03/08/19 0907  Weight: 135 lb (61.2 kg)  Height: 5\' 1"  (1.549 m)   unable    Assessment and Plan:   1.  Parkinson's disease  -Patient reports that she is now taking her medication 3 times per day (was on a twice per day).  Continue carbidopa/levodopa 25/100, 1 tablet 3 times per day. 2.  Memory change  - She had neurocognitive testing done on February 12, 2019 with Dr. Cecelia Byars.  This was done through a tele-visit encounter and was not in person.  The diagnosis was moderate level of anxiety and depression.  The psychologist did state that the patient had evidence of dementia, but also noted that "the dementia diagnosis is based on the presence of functional difficulties."  She did feel that the patient was demonstrating possible early signs of Parkinson's dementia, with additional exacerbating factors "including chronic pain, insomnia, and cardiovascular/cerebrovascular risk factors."  She noted that anxiety and depression were playing a role in the patient's cognitive dysfunction.   -I think  patient's biggest issue is really anxiety and depression, which she denies.  However, she very much admits to insomnia, so we ultimately agreed to starting mirtazapine, hoping that it would perhaps help both.  We will start with 15 mg at night.  We discussed risk, benefits, and side effects.  -Patient was agreeable to starting counseling with our social worker, Corwin Levins, for mood.  Judson Roch will contact her.  Follow Up Instructions:  Pt already has a December f/u appt   -I discussed the assessment and treatment plan with the patient. The patient was provided an opportunity to ask questions and all were answered. The patient agreed with the plan and demonstrated an understanding of the instructions.   The patient was advised to call back or seek an in-person evaluation if the symptoms worsen or if the condition fails to improve as anticipated.    Total Time spent in visit with the patient was:  13 min, of which more than 50% of the time was spent in counseling .   Pt understands and  agrees with the plan of care outlined.     Alonza Bogus, DO

## 2019-03-08 ENCOUNTER — Other Ambulatory Visit: Payer: Self-pay

## 2019-03-08 ENCOUNTER — Telehealth (INDEPENDENT_AMBULATORY_CARE_PROVIDER_SITE_OTHER): Payer: Medicare Other | Admitting: Neurology

## 2019-03-08 ENCOUNTER — Encounter: Payer: Self-pay | Admitting: Neurology

## 2019-03-08 ENCOUNTER — Encounter: Payer: Self-pay | Admitting: Cardiology

## 2019-03-08 VITALS — Ht 61.0 in | Wt 135.0 lb

## 2019-03-08 DIAGNOSIS — F0280 Dementia in other diseases classified elsewhere without behavioral disturbance: Secondary | ICD-10-CM | POA: Diagnosis not present

## 2019-03-08 DIAGNOSIS — G4700 Insomnia, unspecified: Secondary | ICD-10-CM | POA: Diagnosis not present

## 2019-03-08 DIAGNOSIS — F330 Major depressive disorder, recurrent, mild: Secondary | ICD-10-CM | POA: Diagnosis not present

## 2019-03-08 DIAGNOSIS — G20A1 Parkinson's disease without dyskinesia, without mention of fluctuations: Secondary | ICD-10-CM

## 2019-03-08 DIAGNOSIS — F411 Generalized anxiety disorder: Secondary | ICD-10-CM | POA: Diagnosis not present

## 2019-03-08 DIAGNOSIS — G20 Parkinson's disease: Secondary | ICD-10-CM

## 2019-03-08 MED ORDER — MIRTAZAPINE 15 MG PO TABS: 15.0000 mg | ORAL_TABLET | Freq: Every day | ORAL | 1 refills | Status: DC

## 2019-03-08 NOTE — Progress Notes (Deleted)
Office Visit Note  Patient: Veronica Shaw             Date of Birth: August 28, 1950           MRN: [id number]             PCP: Caroline More, DO Referring: Caroline More, DO Visit Date: 03/15/2019 Occupation: _0 @  Subjective:  No chief complaint on file.   History of Present Illness: Veronica Shaw is a 68 y.o. female ***   Activities of Daily Living:  Patient reports morning stiffness for *** {minute/hour:19697}.   Patient {ACTIONS;DENIES/REPORTS:21021675::"Denies"} nocturnal pain.  Difficulty dressing/grooming: {ACTIONS;DENIES/REPORTS:21021675::"Denies"} Difficulty climbing stairs: {ACTIONS;DENIES/REPORTS:21021675::"Denies"} Difficulty getting out of chair: {ACTIONS;DENIES/REPORTS:21021675::"Denies"} Difficulty using hands for taps, buttons, cutlery, and/or writing: {ACTIONS;DENIES/REPORTS:21021675::"Denies"}  No Rheumatology ROS completed.   PMFS History:  Patient Active Problem List   Diagnosis Date Noted  . Hyperlipidemia associated with type 2 diabetes mellitus ([place]) 03/05/2019  . Contact dermatitis 03/05/2019  . Cervical disc disorder with radiculopathy of cervical region 09/24/2018  . Cognitive impairment 10/19/2017  . Osteopenia 07/10/2015  . Mild persistent asthma in adult without complication 63/87/5643  . NICM (nonischemic cardiomyopathy) ([place]) 04/23/2014  . Arthritis or polyarthritis, rheumatoid ([place]) 03/12/2014  . ICD (implantable cardioverter-defibrillator), dual, in situ 03/06/2014  . Thoracic or lumbosacral neuritis or radiculitis, unspecified 07/03/2013  . Chronic systolic heart failure ([place]) 05/31/2013  . Parkinson disease ([place]) 05/21/2013  . Major depression 03/31/2013  . Balance problem 03/03/2013  . GERD (gastroesophageal reflux disease) 09/27/2012  . Plantar fasciitis, bilateral 06/15/2012  . 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 Diabetic peripheral neuropathy ([place]) 12/09/2011  . Hyperlipidemia 12/09/2011  . Sleep apnea 12/09/2011  . Chronic  urticaria 05/27/2011  . 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 05/20/2011  . ROSACEA 05/29/2009  . ACHILLES BURSITIS OR TENDINITIS 03/27/2009  . Type II diabetes mellitus with complication ([place]) 32/95/1884  . Essential hypertension 09/28/2006    Past Medical History:  Diagnosis Date  . Asthma   . Chronic systolic CHF (congestive heart failure) ([place])    a. cMRI 4/15: EF 34% and findings - c/w NICM, normal RV size and function (RVEF 61%), Mild MR // b. Echo 2/15:  EF 30-35%, diff HK, ant-sept AK, Gr 2 DD, mild MR, trivial TR  //  c. Echo 5/17: EF 20-25%, severe diffuse HK, marked systolic dyssynchrony, grade 1 diastolic dysfunction, mild MR  //  d. RHC 5/17: Fick CO 2.9, RVSP 19, PASP 15, PW mean 2, low filing pressures and preserved CO   . Diabetes mellitus   . Flu 10/17/2017  . Gastritis   . History of echocardiogram    Echo 6/18: EF 30-35, diffuse HK, grade 1 diastolic dysfunction, trivial MR, mild LAE, mild TR, no pericardial effusion  . History of nuclear stress test    Myoview 5/18: EF 49, no ischemia, inferoseptal defect c/w LBBB artifact (intermediate risk due to EF < 50).  [name] Kitchen HTN (hypertension)   . Hyperlipidemia   . NICM (nonischemic cardiomyopathy) ([place])    a. Nuclear 5/13: Normal stress nuclear study. LV Ejection Fraction: 58%  //  b. LHC 10/14: Minor luminal irregularity in prox LAD, EF 35%   . Parkinson disease ([place])   . Plantar fasciitis   . Sleep apnea    was retested and no longer had it and so d/c CPAP  . Urticaria     Family History  Problem Relation Age of Onset  . Coronary artery disease Father        Died age 73  .  Heart attack Father   . Diabetes Father   . Coronary artery disease Mother        Died age 48  . Heart attack Mother   . Diabetes Mother   . Parkinson's disease Sister   . Heart disease Sister   . Hepatitis C Sister   . Diabetes Sister   . Diabetes Son 37       T1DM  . Healthy Daughter   . Diabetes Sister   . Heart disease Sister   . Diabetes Sister   . Heart  disease Sister   . Breast cancer Neg Hx    Past Surgical History:  Procedure Laterality Date  . BREAST SURGERY Left   . BUNIONECTOMY    . CARDIAC CATHETERIZATION N/A 12/16/2015   Procedure: Right Heart Cath;  Surgeon: Sherren Mocha, MD;  Location: [facility];  Service: Cardiovascular;  Laterality: N/A;  . CHOLECYSTECTOMY    . IMPLANTABLE CARDIOVERTER DEFIBRILLATOR IMPLANT  11-25-13   MDT dual chamber ICD implanted by [name] for primary prevention  . IMPLANTABLE CARDIOVERTER DEFIBRILLATOR IMPLANT N/A 11/25/2013   Procedure: IMPLANTABLE CARDIOVERTER DEFIBRILLATOR IMPLANT;  Surgeon: [name], MD;  Location: [facility] CATH LAB;  Service: Cardiovascular;  Laterality: N/A;  . LEFT AND RIGHT HEART CATHETERIZATION WITH CORONARY ANGIOGRAM N/A 05/31/2013   Procedure: LEFT AND RIGHT HEART CATHETERIZATION WITH CORONARY ANGIOGRAM;  Surgeon: [name], MD;  Location: [facility] CATH LAB;  Service: Cardiovascular;  Laterality: N/A;  . TONSILLECTOMY    . TUBAL LIGATION     Social History   Social History Narrative   Lives at home with husband and the dog.   2 children   Right handed   Drinks coffee, tea, and soda    Immunization History  Administered Date(s) Administered  . Influenza Split 06/17/2011, 04/18/2012  . Influenza Whole 05/17/2007, 06/14/2010  . Influenza,inj,Quad PF,6+ Mos 05/20/2014, 06/04/2015, 03/22/2016, 05/30/2017, 04/12/2018  . Influenza,inj,Quad PF,6-35 Mos 05/03/2013  . Pneumococcal Conjugate-13 05/20/2014  . Pneumococcal Polysaccharide-23 05/17/2007, 06/01/2013, 03/05/2019  . Td 03/01/2006  . Zoster 08/01/2009     Objective: Vital Signs: LMP 08/01/2000 (Approximate)    Physical Exam   Musculoskeletal Exam: ***  CDAI Exam: CDAI Score: - Patient Global: -; Provider Global: - Swollen: -; Tender: - Joint Exam   No joint exam has been documented for this visit   There is currently no information documented on the homunculus. Go to the Rheumatology activity  and complete the homunculus joint exam.  Investigation: No additional findings.  Imaging: No results found.  Recent Labs: Lab Results  Component Value Date   WBC 9.6 01/30/2019   HGB 13.3 01/30/2019   PLT 247 01/30/2019   NA 144 03/05/2019   K 4.9 03/05/2019   CL 104 03/05/2019   CO2 23 03/05/2019   GLUCOSE 165 (H) 03/05/2019   BUN 24 03/05/2019   CREATININE 1.31 (H) 03/05/2019   BILITOT 0.4 01/30/2019   ALKPHOS 83 05/24/2018   AST 18 01/30/2019   ALT 6 01/30/2019   PROT 7.0 01/30/2019   PROT 7.0 01/30/2019   ALBUMIN 3.9 05/24/2018   CALCIUM 10.2 03/05/2019   GFRAA 48 (L) 03/05/2019   QFTBGOLDPLUS NEGATIVE 01/30/2019  UA positive for 3+ hemoglobin and 3+ leukocytes, SPEP normal, TB Gold negative, immunoglobulins normal, G6PD normal, hepatitis B-, hepatitis C negative, ESR 17, RF negative, anti-CCP 36, _0 eta negative, ANA negative, uric acid 5.9  Speciality Comments: Pertinent PMH: CHF, Hyperlipidemia, DM II, Parkinson's  Procedures:  No procedures performed Allergies: Aspirin, Penicillins, Prempro [conj estrog-medroxyprogest ace], Ace inhibitors, and Simvastatin   Assessment / Plan:     Visit Diagnoses: Rheumatoid arthritis of multiple sites with negative rheumatoid factor (HCC) - RF negative, anti-CCP positive, patient was treated with DMARDs by Dr. Trudie Reed in the past. - Plan: ***  Primary osteoarthritis of both hands - Severe osteoarthritis was noted on the x-rays. - Plan: ***  Primary osteoarthritis of both knees - Right knee severe osteoarthritis, left knee moderate osteoarthritis, bilateral chondromalacia patella. - Plan: ***  DDD (degenerative disc disease), cervical - Plan: ***  DDD (degenerative disc disease), lumbar - Plan: ***  Bunion, left foot - Plan: ***  High risk medication use - Plan: ***  Chronic urticaria - Plan: ***  Osteopenia, unspecified location - Plan: ***  Type II diabetes mellitus with complication ([place]) - Plan: ***  Diabetic  peripheral neuropathy ([place]) - Plan: ***  Essential hypertension - Plan: ***  Parkinson disease ([place]) - Plan: ***  Asthma with COPD with exacerbation ([place]) - Plan: ***  NICM (nonischemic cardiomyopathy) ([place]) - Plan: ***  Chronic systolic heart failure ([place]) - Plan: ***  Cognitive impairment - Plan: ***  History of hyperlipidemia - Plan: ***  History of depression - Plan: ***  Orders: No orders of the defined types were placed in this encounter.  No orders of the defined types were placed in this encounter.   Face-to-face time spent with patient was *** minutes. Greater than 50% of time was spent in counseling and coordination of care.  Follow-Up Instructions: No follow-ups on file.   Bo Merino, MD  Note - This record has been created using Editor, commissioning.  Chart creation errors have been sought, but may not always  have been located. Such creation errors do not reflect on  the standard of medical care.

## 2019-03-08 NOTE — Progress Notes (Signed)
Remote ICD transmission.   

## 2019-03-13 DIAGNOSIS — Z961 Presence of intraocular lens: Secondary | ICD-10-CM | POA: Diagnosis not present

## 2019-03-13 DIAGNOSIS — N183 Chronic kidney disease, stage 3 (moderate): Secondary | ICD-10-CM | POA: Diagnosis not present

## 2019-03-13 DIAGNOSIS — H35351 Cystoid macular degeneration, right eye: Secondary | ICD-10-CM | POA: Diagnosis not present

## 2019-03-13 DIAGNOSIS — E113291 Type 2 diabetes mellitus with mild nonproliferative diabetic retinopathy without macular edema, right eye: Secondary | ICD-10-CM | POA: Diagnosis not present

## 2019-03-15 ENCOUNTER — Ambulatory Visit: Payer: Medicare Other | Admitting: Rheumatology

## 2019-03-20 DIAGNOSIS — I129 Hypertensive chronic kidney disease with stage 1 through stage 4 chronic kidney disease, or unspecified chronic kidney disease: Secondary | ICD-10-CM | POA: Diagnosis not present

## 2019-03-20 DIAGNOSIS — I5022 Chronic systolic (congestive) heart failure: Secondary | ICD-10-CM | POA: Diagnosis not present

## 2019-03-20 DIAGNOSIS — N183 Chronic kidney disease, stage 3 (moderate): Secondary | ICD-10-CM | POA: Diagnosis not present

## 2019-03-26 ENCOUNTER — Ambulatory Visit (INDEPENDENT_AMBULATORY_CARE_PROVIDER_SITE_OTHER): Payer: Medicare Other

## 2019-03-26 DIAGNOSIS — Z95810 Presence of automatic (implantable) cardiac defibrillator: Secondary | ICD-10-CM | POA: Diagnosis not present

## 2019-03-26 DIAGNOSIS — I5022 Chronic systolic (congestive) heart failure: Secondary | ICD-10-CM | POA: Diagnosis not present

## 2019-03-27 DIAGNOSIS — H02831 Dermatochalasis of right upper eyelid: Secondary | ICD-10-CM | POA: Diagnosis not present

## 2019-03-27 DIAGNOSIS — H02834 Dermatochalasis of left upper eyelid: Secondary | ICD-10-CM | POA: Diagnosis not present

## 2019-03-28 ENCOUNTER — Other Ambulatory Visit: Payer: Self-pay | Admitting: *Deleted

## 2019-03-28 ENCOUNTER — Telehealth: Payer: Self-pay | Admitting: Clinical

## 2019-03-28 NOTE — Telephone Encounter (Signed)
LCSW spoke with pt, provided supportive listening. Pt taking care of 70 yo grandson & has upcoming medical procedure & so opts to defer counseling at this time. Pt states she will call to schedule w LCSW when schedule permits. Please schedule counseling appt on Wed or Fri if pt calls.

## 2019-03-28 NOTE — Patient Outreach (Signed)
[facility]) Care Management  03/28/2019  Veronica Shaw 04/07/1951 [id number]   RN Health Coach attempted follow up outreach call to patient.  Patient was driving car and stated could she be called back later. Unable to concentrate while driving.  Plan: RN will call patient again within 14 days.  [facility] Care Management [phone]

## 2019-03-29 NOTE — Progress Notes (Signed)
EPIC Encounter for ICM Monitoring  Patient Name: Veronica Shaw is a 68 y.o. female Date: 03/29/2019 Primary Care Physican: Caroline More, DO Primary Cardiologist:Cooper/Weaver PA Electrophysiologist: Lovena Le 01/25/2019 Weight: 137 lbs 02/22/2019 Weight: 137 lbs  Spoke with patient and heart failure questions reviewed.  She said she is doing good.   OptiVolThoracic impedancenormal.  Prescribed:Furosemide40 mgTake 0.5 tablets (20 mg total) by mouth every other day as needed for fluid (For swelling).   Labs: 01/30/2019 Creatinine 1.44, BUN 22, Potassium 4.5, Sodium 139, GFR 37-43 A complete set of results can be found in Results Review.  Recommendations: No changes and encouraged to call if experiencing any fluid symptoms.  Follow-up plan: ICM clinic phone appointment on10/30/2020 and 91 day device clinic remote transmission 05/30/2019.  Copy of ICM check sent to Dr.Taylor.  3 month ICM trend: 03/26/2019    1 Year ICM trend:       [name], RN 03/29/2019 2:31 PM

## 2019-04-01 DIAGNOSIS — Z23 Encounter for immunization: Secondary | ICD-10-CM | POA: Diagnosis not present

## 2019-04-02 ENCOUNTER — Other Ambulatory Visit: Payer: Self-pay | Admitting: *Deleted

## 2019-04-02 ENCOUNTER — Other Ambulatory Visit: Payer: Self-pay | Admitting: Family Medicine

## 2019-04-02 DIAGNOSIS — H6992 Unspecified Eustachian tube disorder, left ear: Secondary | ICD-10-CM

## 2019-04-02 NOTE — Patient Outreach (Signed)
[facility]) Care Management  04/02/2019   Veronica STJULIEN 1951/06/12 [id number]  RN Health Coach telephone call to patient.  [employer]ipaa compliance verified. Per patient she is doing good. Patient stated that she is driving now and pulled over to talk. Patient is doing daily weight checks. Patient is doing stretching exercises. Patient A1C is 6.6 Patient stated that she is having some shortness of breath on exertion. Per patient she had cataract removed and now has droopy lids. Patient is scheduling blepharoplasty in the future. Patient had received flu shot. Patient had agreed to follow up outreach calls.   Current Medications:  Current Outpatient Medications  Medication Sig Dispense Refill  . albuterol (PROVENTIL HFA;VENTOLIN HFA) 108 (90 Base) MCG/ACT inhaler Inhale 2 puffs into the lungs every 4 (four) hours as needed for wheezing or shortness of breath. 18 g 3  . Biotin 5000 MCG CAPS Take 1 tablet by mouth daily.    . Calcium Carb-Cholecalciferol 600-800 MG-UNIT TABS Take 1 tablet by mouth daily. 90 tablet 3  . carbidopa-levodopa (SINEMET IR) 25-100 MG tablet TAKE 1 TABLET BY MOUTH THREE TIMES DAILY 270 tablet 1  . famotidine (PEPCID) 20 MG tablet TAKE 1 TABLET (20 MG TOTAL) BY MOUTH 2 (TWO) TIMES DAILY. 120 tablet 1  . fluticasone (FLONASE) 50 MCG/ACT nasal spray Place 2 sprays into both nostrils daily. 16 g 6  . furosemide (LASIX) 40 MG tablet TAKE 1/2 TABLET EVERY DAY AS NEEDED FOR FLUID/SWELLING 30 tablet 3  . gabapentin (NEURONTIN) 100 MG capsule Take '300mg'$  (3 pills) at night before bed and '100mg'$  (1 pill) in the morning with breakfast. (Patient taking differently: as needed. Take '300mg'$  (3 pills) at night before bed and '100mg'$  (1 pill) in the morning with breakfast.) 120 capsule 1  . levalbuterol (XOPENEX) 0.63 MG/3ML nebulizer solution 1 vial every 4-6 hours as needed for wheezing and shortness of breath. 180 mL 2  . losartan (COZAAR) 25 MG tablet TAKE 1 TABLET EVERY DAY 90  tablet 2  . metFORMIN (GLUCOPHAGE) 500 MG tablet Take 1,000 mg by mouth 2 (two) times daily with a meal.    . methocarbamol (ROBAXIN) 750 MG tablet Take 750 mg by mouth 3 (three) times daily.    . metoprolol succinate (TOPROL-XL) 50 MG 24 hr tablet TAKE 1 TABLET TWICE DAILY 180 tablet 3  . mirtazapine (REMERON) 15 MG tablet Take 1 tablet (15 mg total) by mouth at bedtime. 90 tablet 1  . mometasone-formoterol (DULERA) 200-5 MCG/ACT AERO Inhale 2 puffs into the lungs 2 (two) times daily. 3 Inhaler 3  . potassium chloride (K-DUR,KLOR-CON) 10 MEQ tablet Take 1 tablet (10 mEq total) by mouth as needed (take with lasix as needed for swelling). 30 tablet 2  . pravastatin (PRAVACHOL) 20 MG tablet Take 1 tablet (20 mg total) by mouth at bedtime. 90 tablet 0  . spironolactone (ALDACTONE) 25 MG tablet TAKE 1/2 TABLET EVERY DAY 45 tablet 2  . triamcinolone ointment (KENALOG) 0.1 % Apply 1 application topically 2 (two) times daily. 15 g 1  . Turmeric 500 MG TABS Take 1 tablet by mouth daily.     No current facility-administered medications for this visit.     Functional Status:  In your present state of health, do you have any difficulty performing the following activities: 04/02/2019 12/26/2018  Hearing? N N  Vision? Y N  Comment Patient stated he eyelids are droopy and eyes always puffy -  Difficulty concentrating or making decisions? (No Data) Y  Comment per patient she is forgetful per patient sometimes she gets forgetful  Walking or climbing stairs? Y Y  Comment pt has shortness of breath of on exertion shortness of breath on exertion  Dressing or bathing? N N  Doing errands, shopping? [name]  Comment family does most of the shopping. Patient is doing a little driving now family does all the shopping for patient  Preparing Food and eating ? N N  Using the Toilet? N N  In the past six months, have you accidently leaked urine? Y Y  Comment - some stress incont  Do you have problems with loss of bowel  control? N N  Managing your Medications? N N  Managing your Finances? N N  Housekeeping or managing your Housekeeping? N N  Some recent data might be hidden    Fall/Depression Screening: Fall Risk  04/02/2019 03/08/2019 03/05/2019  Falls in the past year? '1 1 1  '$ Comment - - -  Number falls in past yr: '1 1 1  '$ Injury with Fall? 0 1 1  Comment - - knee problem  Risk Factor Category  Low Risk (0 Points) - -  Risk for fall due to : Impaired balance/gait;Impaired mobility;Impaired vision;History of fall(s) - -  Follow up Falls evaluation completed;Education provided;Falls prevention discussed - Follow up appointment  Comment - - md informed   PHQ 2/9 Scores 04/02/2019 03/05/2019 12/26/2018 11/19/2018 09/27/2018 09/24/2018 05/25/2018  PHQ - 2 Score 0 0 0 0 0 0 0  PHQ- 9 Score - - - - - - -  Exception Documentation - - - - - - -   THN CM Care Plan Problem One     Most Recent Value  Care Plan Problem One  Knowledge Deficit in self management of chf  Role Documenting the Problem One  [place] for Problem One  Active  THN Long Term Goal   Patient will follow up on health maintenance within the next 90 days  THN Long Term Goal Start Date  04/02/19  [facility] Long Term Goal Met Date  04/02/19  Interventions for Problem One Long Term Goal  RN discussed health maintenance care such as mammagram, flu shot, hearing test. RN will follow up for compliance      Assessment:  Patient is weighing daily A1C 6.6 Shortness of breath on exertion Patient received flu shot Patient had eye exam and planning future blepharoplasty  Plan:  Patient will follow up with health maintenance care Patient will continue to follow pandemic safety precaution, mask, gloves and handwashing.  Patient will continue to do daily weights Patient will continue to try to eat healthy RN will follow up outreach call within the month of December

## 2019-04-04 ENCOUNTER — Other Ambulatory Visit: Payer: Self-pay

## 2019-04-04 ENCOUNTER — Telehealth (INDEPENDENT_AMBULATORY_CARE_PROVIDER_SITE_OTHER): Payer: Medicare Other | Admitting: Family Medicine

## 2019-04-04 DIAGNOSIS — R42 Dizziness and giddiness: Secondary | ICD-10-CM

## 2019-04-04 MED ORDER — MECLIZINE HCL 12.5 MG PO TABS: 6.5000 mg | ORAL_TABLET | Freq: Three times a day (TID) | ORAL | 0 refills | Status: DC | PRN

## 2019-04-10 NOTE — Assessment & Plan Note (Addendum)
Patient with reported inner ear fullness and room spinning type symptoms.  Consistent with her previous diagnosis of vertigo.  Explained to patient that it is impossible me evaluate her inner ear over the phone.  Since she did get relief from the Flonase, meclizine, oxymetazoline combo she can redo this.  Sent in a prescription for meclizine 6.125 mg.  Gave return precautions, if patient does not have improvement or symptoms worsen did explain that we would need to see her in clinic or she would need to go back and see ENT.

## 2019-04-10 NOTE — Progress Notes (Signed)
[facility] Telemedicine Visit  Patient consented to have virtual visit. Method of visit: Telephone  Encounter participants: Patient: Veronica Shaw - located at home Provider: Guadalupe Dawn - located at [facility]  Chief Complaint: Vertigo and inner ear fullness  HPI: 68 year old female who presents with feeling of the room spinning and left inner ear fullness.  She states that she has had these symptoms off and on for the last 18 years. One point she was sent to an ENT and underwent audiology eval.  Patient was supposed to follow-up but did not with that service.  States that for the last week she has had increase of her symptoms.  The symptoms are the room spinning, very mild gait abnormality, and one episode of emesis.  She was last seen as a telemedicine visit on 11/19/2018.  She was diagnosed with eustachian tube disorder and treated with meclizine, Flonase, oxymetolazone.  She is been taking the Flonase for several years.  She states the meclizine (6.125 mg) did greatly improve her symptoms.   ROS: per HPI  Pertinent PMHx: vertigo  Exam:  General: Very pleasant female, no acute distress Respiratory: No respiratory distress able speak in clear coherent sentences Psych: Current thought process, appropriate  Assessment/Plan:  Vertigo Patient with reported inner ear fullness and room spinning type symptoms.  Consistent with her previous diagnosis of vertigo.  Explained to patient that it is impossible me evaluate her inner ear over the phone.  Since she did get relief from the Flonase, meclizine, oxymetazoline combo she can redo this.  Sent in a prescription for meclizine 6.125 mg.  Gave return precautions, if patient does not have improvement or symptoms worsen did explain that we would need to see her in clinic or she would need to go back and see ENT.    Time spent during visit with patient: 12 minutes

## 2019-05-02 DIAGNOSIS — H02834 Dermatochalasis of left upper eyelid: Secondary | ICD-10-CM | POA: Diagnosis not present

## 2019-05-02 DIAGNOSIS — H02883 Meibomian gland dysfunction of right eye, unspecified eyelid: Secondary | ICD-10-CM | POA: Diagnosis not present

## 2019-05-02 DIAGNOSIS — H02886 Meibomian gland dysfunction of left eye, unspecified eyelid: Secondary | ICD-10-CM | POA: Diagnosis not present

## 2019-05-02 DIAGNOSIS — H02831 Dermatochalasis of right upper eyelid: Secondary | ICD-10-CM | POA: Diagnosis not present

## 2019-05-05 DIAGNOSIS — Z01818 Encounter for other preprocedural examination: Secondary | ICD-10-CM | POA: Diagnosis not present

## 2019-05-09 DIAGNOSIS — Z7984 Long term (current) use of oral hypoglycemic drugs: Secondary | ICD-10-CM | POA: Diagnosis not present

## 2019-05-09 DIAGNOSIS — Z7951 Long term (current) use of inhaled steroids: Secondary | ICD-10-CM | POA: Diagnosis not present

## 2019-05-09 DIAGNOSIS — K219 Gastro-esophageal reflux disease without esophagitis: Secondary | ICD-10-CM | POA: Diagnosis not present

## 2019-05-09 DIAGNOSIS — I129 Hypertensive chronic kidney disease with stage 1 through stage 4 chronic kidney disease, or unspecified chronic kidney disease: Secondary | ICD-10-CM | POA: Diagnosis not present

## 2019-05-09 DIAGNOSIS — H02834 Dermatochalasis of left upper eyelid: Secondary | ICD-10-CM | POA: Diagnosis not present

## 2019-05-09 DIAGNOSIS — N183 Chronic kidney disease, stage 3 unspecified: Secondary | ICD-10-CM | POA: Diagnosis not present

## 2019-05-09 DIAGNOSIS — Z880 Allergy status to penicillin: Secondary | ICD-10-CM | POA: Diagnosis not present

## 2019-05-09 DIAGNOSIS — E1122 Type 2 diabetes mellitus with diabetic chronic kidney disease: Secondary | ICD-10-CM | POA: Diagnosis not present

## 2019-05-09 DIAGNOSIS — E785 Hyperlipidemia, unspecified: Secondary | ICD-10-CM | POA: Diagnosis not present

## 2019-05-09 DIAGNOSIS — H02831 Dermatochalasis of right upper eyelid: Secondary | ICD-10-CM | POA: Diagnosis not present

## 2019-05-09 DIAGNOSIS — J45909 Unspecified asthma, uncomplicated: Secondary | ICD-10-CM | POA: Diagnosis not present

## 2019-05-09 DIAGNOSIS — Z95810 Presence of automatic (implantable) cardiac defibrillator: Secondary | ICD-10-CM | POA: Diagnosis not present

## 2019-05-09 DIAGNOSIS — Z79899 Other long term (current) drug therapy: Secondary | ICD-10-CM | POA: Diagnosis not present

## 2019-05-09 HISTORY — PX: OTHER SURGICAL HISTORY: SHX169

## 2019-05-24 ENCOUNTER — Ambulatory Visit (INDEPENDENT_AMBULATORY_CARE_PROVIDER_SITE_OTHER): Payer: Medicare Other | Admitting: Rheumatology

## 2019-05-24 ENCOUNTER — Encounter: Payer: Self-pay | Admitting: Rheumatology

## 2019-05-24 ENCOUNTER — Other Ambulatory Visit: Payer: Self-pay

## 2019-05-24 ENCOUNTER — Telehealth: Payer: Self-pay

## 2019-05-24 VITALS — BP 110/60 | HR 75 | Resp 14 | Ht 61.5 in | Wt 143.2 lb

## 2019-05-24 DIAGNOSIS — R768 Other specified abnormal immunological findings in serum: Secondary | ICD-10-CM | POA: Diagnosis not present

## 2019-05-24 DIAGNOSIS — M8580 Other specified disorders of bone density and structure, unspecified site: Secondary | ICD-10-CM | POA: Diagnosis not present

## 2019-05-24 DIAGNOSIS — I5022 Chronic systolic (congestive) heart failure: Secondary | ICD-10-CM

## 2019-05-24 DIAGNOSIS — J45901 Unspecified asthma with (acute) exacerbation: Secondary | ICD-10-CM

## 2019-05-24 DIAGNOSIS — E118 Type 2 diabetes mellitus with unspecified complications: Secondary | ICD-10-CM | POA: Diagnosis not present

## 2019-05-24 DIAGNOSIS — L508 Other urticaria: Secondary | ICD-10-CM

## 2019-05-24 DIAGNOSIS — M19071 Primary osteoarthritis, right ankle and foot: Secondary | ICD-10-CM

## 2019-05-24 DIAGNOSIS — M19041 Primary osteoarthritis, right hand: Secondary | ICD-10-CM | POA: Diagnosis not present

## 2019-05-24 DIAGNOSIS — M170 Bilateral primary osteoarthritis of knee: Secondary | ICD-10-CM | POA: Diagnosis not present

## 2019-05-24 DIAGNOSIS — J441 Chronic obstructive pulmonary disease with (acute) exacerbation: Secondary | ICD-10-CM

## 2019-05-24 DIAGNOSIS — M5030 Other cervical disc degeneration, unspecified cervical region: Secondary | ICD-10-CM

## 2019-05-24 DIAGNOSIS — M19072 Primary osteoarthritis, left ankle and foot: Secondary | ICD-10-CM

## 2019-05-24 DIAGNOSIS — E1142 Type 2 diabetes mellitus with diabetic polyneuropathy: Secondary | ICD-10-CM

## 2019-05-24 DIAGNOSIS — M5136 Other intervertebral disc degeneration, lumbar region: Secondary | ICD-10-CM | POA: Diagnosis not present

## 2019-05-24 DIAGNOSIS — Z8639 Personal history of other endocrine, nutritional and metabolic disease: Secondary | ICD-10-CM

## 2019-05-24 DIAGNOSIS — M19042 Primary osteoarthritis, left hand: Secondary | ICD-10-CM

## 2019-05-24 DIAGNOSIS — I10 Essential (primary) hypertension: Secondary | ICD-10-CM

## 2019-05-24 DIAGNOSIS — G20 Parkinson's disease: Secondary | ICD-10-CM

## 2019-05-24 DIAGNOSIS — I428 Other cardiomyopathies: Secondary | ICD-10-CM

## 2019-05-24 NOTE — Telephone Encounter (Signed)
Please apply for bilateral knee visco, per Taylor Dale, PA-C. Thanks!  

## 2019-05-24 NOTE — Progress Notes (Signed)
Office Visit Note  Patient: Veronica Shaw             Date of Birth: Jun 30, 1951           MRN: [id number]             PCP: Caroline More, DO Referring: Caroline More, DO Visit Date: 05/24/2019 Occupation: @GUAROCC @  Subjective:  Discuss lab work   History of Present Illness: Veronica Shaw is a 68 y.o. female with history of osteoarthritis and DDD.  She is experiencing neck pain and stiffness.  She had a CT of the neck in July, which showed spondylosis and spinal stenosis.  She reports she is not a candidate for surgery due to her other health concerns.  She continues to have ongoing left knee joint pain, and she wears a brace for support. She denies any left knee joint swelling.  She has had cortisone injections in the past, which only provided temporary relief.  She is a diabetic and the injections cause an elevation in her BP.    Activities of Daily Living:  Patient reports morning stiffness for 2 hours.   Patient Reports nocturnal pain.  Difficulty dressing/grooming: Denies Difficulty climbing stairs: Reports Difficulty getting out of chair: Denies Difficulty using hands for taps, buttons, cutlery, and/or writing: Denies  Review of Systems  Constitutional: Positive for fatigue.  HENT: Positive for mouth dryness. Negative for mouth sores and nose dryness.   Eyes: Positive for dryness. Negative for pain, itching and visual disturbance.  Respiratory: Negative for cough, hemoptysis, shortness of breath and difficulty breathing.   Cardiovascular: Negative for chest pain, palpitations, hypertension and swelling in legs/feet.  Gastrointestinal: Negative for blood in stool, constipation and diarrhea.  Endocrine: Negative for increased urination.  Genitourinary: Negative for difficulty urinating and painful urination.  Musculoskeletal: Positive for arthralgias, joint pain and morning stiffness. Negative for joint swelling, myalgias, muscle weakness, muscle tenderness and myalgias.   Skin: Negative for color change, pallor, rash, hair loss, nodules/bumps, skin tightness, ulcers and sensitivity to sunlight.  Allergic/Immunologic: Negative for susceptible to infections.  Neurological: Negative for dizziness, light-headedness, numbness, headaches, memory loss and weakness.  Hematological: Negative for swollen glands.  Psychiatric/Behavioral: Positive for sleep disturbance. Negative for depressed mood and confusion. The patient is not nervous/anxious.     PMFS History:  Patient Active Problem List   Diagnosis Date Noted  . Hyperlipidemia associated with type 2 diabetes mellitus ([place]) 03/05/2019  . Contact dermatitis 03/05/2019  . Cervical disc disorder with radiculopathy of cervical region 09/24/2018  . Cognitive impairment 10/19/2017  . Osteopenia 07/10/2015  . Mild persistent asthma in adult without complication [id number]  . NICM (nonischemic cardiomyopathy) ([place]) 04/23/2014  . Arthritis or polyarthritis, rheumatoid ([place]) 03/12/2014  . ICD (implantable cardioverter-defibrillator), dual, in situ 03/06/2014  . Thoracic or lumbosacral neuritis or radiculitis, unspecified 07/03/2013  . Chronic systolic heart failure ([place]) 05/31/2013  . Parkinson disease ([place]) 05/21/2013  . Major depression 03/31/2013  . Vertigo 03/03/2013  . GERD (gastroesophageal reflux disease) 09/27/2012  . Plantar fasciitis, bilateral 06/15/2012  . 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 Diabetic peripheral neuropathy ([place]) 12/09/2011  . Hyperlipidemia 12/09/2011  . Sleep apnea 12/09/2011  . Chronic urticaria 05/27/2011  . 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 05/20/2011  . ROSACEA 05/29/2009  . ACHILLES BURSITIS OR TENDINITIS 03/27/2009  . Type II diabetes mellitus with complication ([place]) [id number]  . Essential hypertension 09/28/2006    Past Medical History:  Diagnosis Date  . Asthma   . Chronic systolic CHF (congestive  heart failure) ([place])    a. cMRI 4/15: EF 34% and findings - c/w NICM, normal RV size and  function (RVEF 61%), Mild MR // b. Echo 2/15:  EF 30-35%, diff HK, ant-sept AK, Gr 2 DD, mild MR, trivial TR  //  c. Echo 5/17: EF 20-25%, severe diffuse HK, marked systolic dyssynchrony, grade 1 diastolic dysfunction, mild MR  //  d. RHC 5/17: Fick CO 2.9, RVSP 19, PASP 15, PW mean 2, low filing pressures and preserved CO   . Diabetes mellitus   . Flu 10/17/2017  . Gastritis   . History of echocardiogram    Echo 6/18: EF 30-35, diffuse HK, grade 1 diastolic dysfunction, trivial MR, mild LAE, mild TR, no pericardial effusion  . History of nuclear stress test    Myoview 5/18: EF 49, no ischemia, inferoseptal defect c/w LBBB artifact (intermediate risk due to EF < 50).  [name] Kitchen HTN (hypertension)   . Hyperlipidemia   . NICM (nonischemic cardiomyopathy) ([place])    a. Nuclear 5/13: Normal stress nuclear study. LV Ejection Fraction: 58%  //  b. LHC 10/14: Minor luminal irregularity in prox LAD, EF 35%   . Parkinson disease ([place])   . Plantar fasciitis   . Sleep apnea    was retested and no longer had it and so d/c CPAP  . Urticaria     Family History  Problem Relation Age of Onset  . Coronary artery disease Father        Died age 26  . Heart attack Father   . Diabetes Father   . Coronary artery disease Mother        Died age 85  . Heart attack Mother   . Diabetes Mother   . Parkinson's disease Sister   . Heart disease Sister   . Hepatitis C Sister   . Diabetes Sister   . Diabetes Son 37       T1DM  . Healthy Daughter   . Diabetes Sister   . Heart disease Sister   . Diabetes Sister   . Heart disease Sister   . Breast cancer Neg Hx    Past Surgical History:  Procedure Laterality Date  . BREAST SURGERY Left   . BUNIONECTOMY    . CARDIAC CATHETERIZATION N/A 12/16/2015   Procedure: Right Heart Cath;  Surgeon: Sherren Mocha, MD;  Location: [place] [facility];  Service: Cardiovascular;  Laterality: N/A;  . CHOLECYSTECTOMY    . eye lid surgery Bilateral 05/09/2019  . IMPLANTABLE  CARDIOVERTER DEFIBRILLATOR IMPLANT  11-25-13   MDT dual chamber ICD implanted by [name] for primary prevention  . IMPLANTABLE CARDIOVERTER DEFIBRILLATOR IMPLANT N/A 11/25/2013   Procedure: IMPLANTABLE CARDIOVERTER DEFIBRILLATOR IMPLANT;  Surgeon: [name], MD;  Location: [facility] CATH LAB;  Service: Cardiovascular;  Laterality: N/A;  . LEFT AND RIGHT HEART CATHETERIZATION WITH CORONARY ANGIOGRAM N/A 05/31/2013   Procedure: LEFT AND RIGHT HEART CATHETERIZATION WITH CORONARY ANGIOGRAM;  Surgeon: [name], MD;  Location: [facility] CATH LAB;  Service: Cardiovascular;  Laterality: N/A;  . TONSILLECTOMY    . TUBAL LIGATION     Social History   Social History Narrative   Lives at home with husband and the dog.   2 children   Right handed   Drinks coffee, tea, and soda    Immunization History  Administered Date(s) Administered  . Influenza Split 06/17/2011, 04/18/2012  . Influenza Whole 05/17/2007, 06/14/2010  . Influenza,inj,Quad PF,6+ Mos 05/20/2014, 06/04/2015, 03/22/2016, 05/30/2017, 04/12/2018  . Influenza,inj,Quad  PF,6-35 Mos 05/03/2013  . Influenza-Unspecified 04/01/2019  . Pneumococcal Conjugate-13 05/20/2014  . Pneumococcal Polysaccharide-23 05/17/2007, 06/01/2013, 03/05/2019  . Td 03/01/2006  . Zoster 08/01/2009     Objective: Vital Signs: BP 110/60 (BP Location: Left Arm, Patient Position: Sitting, Cuff Size: Normal)   Pulse 75   Resp 14   Ht 5' 1.5" (1.562 m)   Wt 143 lb 3.2 oz (65 kg)   LMP 08/01/2000 (Approximate)   BMI 26.62 kg/m    Physical Exam Vitals signs and nursing note reviewed.  Constitutional:      Appearance: She is well-developed.  HENT:     Head: Normocephalic and atraumatic.  Eyes:     Conjunctiva/sclera: Conjunctivae normal.  Neck:     Musculoskeletal: Normal range of motion.  Cardiovascular:     Rate and Rhythm: Normal rate and regular rhythm.     Heart sounds: Normal heart sounds.  Pulmonary:     Effort: Pulmonary effort is normal.      Breath sounds: Normal breath sounds.  Abdominal:     General: Bowel sounds are normal.     Palpations: Abdomen is soft.  Lymphadenopathy:     Cervical: No cervical adenopathy.  Skin:    General: Skin is warm and dry.     Capillary Refill: Capillary refill takes less than 2 seconds.  Neurological:     Mental Status: She is alert and oriented to person, place, and time.  Psychiatric:        Behavior: Behavior normal.      Musculoskeletal Exam: C-spine limited ROM with discomfort. Thoracic and lumbar spine good ROM.  No midline spinal tenderness. No SI joint tenderness. Shoulder joints, elbow joints, wrist joints, MCPs, PIPs, DIPs good ROM with no synovitis.  Complete fist formation bilaterally. CMC joint synovial thickening bilaterally.  PIP and DIP synovial thickening consistent with osteoarthritis.  Bilateral 2nd MCP joint synovial thickening and mild ulnar deviation.  Hip joints, knee joints, ankle joints, MTPs, PIPs, and DIPs good ROM with no synovitis.  No warmth or effusion of knee joints.  No tenderness or swelling of ankle joints.   CDAI Exam: CDAI Score: - Patient Global: -; Provider Global: - Swollen: -; Tender: - Joint Exam   No joint exam has been documented for this visit   There is currently no information documented on the homunculus. Go to the Rheumatology activity and complete the homunculus joint exam.  Investigation: No additional findings.  Imaging: No results found.  Recent Labs: Lab Results  Component Value Date   WBC 9.6 01/30/2019   HGB 13.3 01/30/2019   PLT 247 01/30/2019   NA 144 03/05/2019   K 4.9 03/05/2019   CL 104 03/05/2019   CO2 23 03/05/2019   GLUCOSE 165 (H) 03/05/2019   BUN 24 03/05/2019   CREATININE 1.31 (H) 03/05/2019   BILITOT 0.4 01/30/2019   ALKPHOS 83 05/24/2018   AST 18 01/30/2019   ALT 6 01/30/2019   PROT 7.0 01/30/2019   PROT 7.0 01/30/2019   ALBUMIN 3.9 05/24/2018   CALCIUM 10.2 03/05/2019   GFRAA 48 (L) 03/05/2019    QFTBGOLDPLUS NEGATIVE 01/30/2019    Speciality Comments: Pertinent PMH: CHF, Hyperlipidemia, DM II, Parkinson's  Procedures:  No procedures performed Allergies: Aspirin, Penicillins, Prempro [conj estrog-medroxyprogest ace], Ace inhibitors, and Simvastatin   Assessment / Plan:     Visit Diagnoses: Primary osteoarthritis of both hands - CCP 36, RF<14, 14-3-3 negative, sed rate 17, uric acid 5.9, ANA-: She has PIP and DIP  synovial thickening consistent with osteoarthritis of both hands.  She has synovial thickening of bilateral second MCP joints with mild ulnar deviation.  She has complete fist formation bilaterally.  No obvious tenderness or synovitis was noted.  X-rays of both hands were consistent with osteoarthritis and possible inflammatory arthritis.  In the past she was diagnosed with rheumatoid arthritis and was on immunosuppressive agents but is unsure of what medication she has tried.  We will schedule an ultrasound of both hands to assess for synovitis.  She was advised to notify us if she develops increased joint pain or joint swelling.  She will follow-up in the office in 3 months.  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  She is experiencing pain in multiple joints including bilateral hands, bilateral knee joints, and bilateral feet.  We will schedule an ultrasound of both hands to assess for synovitis.  Primary osteoarthritis of both feet: She has no discomfort in her feet at this time.  She was proper fitting shoes.  Primary osteoarthritis of both knees - Left knee-moderate OA, right knee-severe OA. Chondromalacia patella bilaterally: She has good range of motion of bilateral knee joints.  No warmth or effusion was noted.  She has chronic pain in both knee joints especially the left knee joint.  She is wearing a compression brace on the left knee currently to help with support and discomfort.  She has had cortisone injections in the  past which provided only temporary relief.  She has uncontrolled diabetes so we opted out of overall medical injection today.  Treatment options were discussed.  She would like to proceed with applying for Visco gel injections for both knee joints.  DDD (degenerative disc disease), cervical: She has limited range of motion with discomfort.  She has no symptoms of radiculopathy at this time.  She had a CT of the C-spine on 02/04/2019 which revealed multilevel cervical spondylosis and mild spinal canal and severe left neuroforaminal stenosis at C5-C6 with prominent mass-effect on the left ventral cord and effacement of the exiting left C6 nerve root.  According to the patient she was evaluated by a neurosurgeon but she is not a candidate for surgery due to her underlying comorbidities.  DDD (degenerative disc disease), lumbar: She experiences intermittent discomfort in her lower back.  She has no symptoms of radiculopathy at this time.  Other medical conditions are listed as follows:  Chronic urticaria  Osteopenia, unspecified location  Diabetic peripheral neuropathy (HCC)  Type II diabetes mellitus with complication ([place])  Parkinson disease ([place])  Asthma with COPD with exacerbation ([place])  Essential hypertension  NICM (nonischemic cardiomyopathy) ([place])  Chronic systolic heart failure ([place])  History of hyperlipidemia  Orders: No orders of the defined types were placed in this encounter.  No orders of the defined types were placed in this encounter.   Face-to-face time spent with patient was 30  minutes. Greater than 50% of time was spent in counseling and coordination of care.  Follow-Up Instructions: Return in about 3 months (around 08/24/2019) for Osteoarthritis, DDD.   Ofilia Neas, PA-C   I examined and evaluated the patient with Hazel Sams PA.  Patient has positive anti-CCP.  She had no synovitis on my examination.  Have advised her to schedule ultrasound of bilateral hands  to look for synovitis.  The plan of care was discussed as noted above.  Bo Merino, MD  Note - This record has been created using  Dragon software.  Chart creation errors have been sought, but may not always  have been located. Such creation errors do not reflect on  the standard of medical care. 

## 2019-05-27 NOTE — Telephone Encounter (Signed)
Please schedule patient appointments with Hazel Sams, PAC.  Euflexxa series, Bilateral Knees Buy & Bill Medicare and [employer] should cover 100% No Co Pay No PA required

## 2019-05-28 NOTE — Telephone Encounter (Signed)
Patient states she talked to her sister who had a bad reaction to the gel injections in the past and has decided at this time she is not interested in scheduling.

## 2019-05-29 ENCOUNTER — Ambulatory Visit: Payer: Self-pay

## 2019-05-29 ENCOUNTER — Other Ambulatory Visit: Payer: Self-pay

## 2019-05-29 ENCOUNTER — Ambulatory Visit (INDEPENDENT_AMBULATORY_CARE_PROVIDER_SITE_OTHER): Payer: Medicare Other | Admitting: Rheumatology

## 2019-05-29 ENCOUNTER — Other Ambulatory Visit: Payer: Self-pay | Admitting: Pharmacy Technician

## 2019-05-29 DIAGNOSIS — M170 Bilateral primary osteoarthritis of knee: Secondary | ICD-10-CM | POA: Diagnosis not present

## 2019-05-29 DIAGNOSIS — M79641 Pain in right hand: Secondary | ICD-10-CM | POA: Diagnosis not present

## 2019-05-29 DIAGNOSIS — M1711 Unilateral primary osteoarthritis, right knee: Secondary | ICD-10-CM | POA: Diagnosis not present

## 2019-05-29 DIAGNOSIS — M79642 Pain in left hand: Secondary | ICD-10-CM | POA: Diagnosis not present

## 2019-05-29 MED ORDER — LIDOCAINE HCL 1 % IJ SOLN
1.5000 mL | INTRAMUSCULAR | Status: AC | PRN
  Administered 2019-05-29: 1.5 mL

## 2019-05-29 MED ORDER — SODIUM HYALURONATE (VISCOSUP) 20 MG/2ML IX SOSY
20.0000 mg | PREFILLED_SYRINGE | INTRA_ARTICULAR | Status: AC | PRN
  Administered 2019-05-29: 20 mg via INTRA_ARTICULAR

## 2019-05-29 NOTE — Progress Notes (Signed)
   Procedure Note  Patient: Veronica Shaw             Date of Birth: February 13, 1951           MRN: [id number]             Visit Date: 05/29/2019  Procedures: Visit Diagnoses:  1. Pain in both hands    Euflexxa #1 right knee B/B Large Joint Inj: R knee on 05/29/2019 2:03 PM Indications: pain Details: 27 G 1.5 in needle, medial approach  Arthrogram: No  Medications: 20 mg Sodium Hyaluronate 20 MG/2ML; 1.5 mL lidocaine 1 % Aspirate: 0 mL Outcome: tolerated well, no immediate complications Procedure, treatment alternatives, risks and benefits explained, specific risks discussed. Consent was given by the patient. Immediately prior to procedure a time out was called to verify the correct patient, procedure, equipment, support staff and site/side marked as required. Patient was prepped and draped in the usual sterile fashion.    Postprocedure instructions were discussed. This patient is diagnosed with osteoarthritis of the knee(s).    Radiographs show evidence of joint space narrowing, osteophytes, subchondral sclerosis and/or subchondral cysts.  This patient has knee pain which interferes with functional and activities of daily living.    This patient has experienced inadequate response, adverse effects and/or intolerance with conservative treatments such as acetaminophen, NSAIDS, topical creams, physical therapy or regular exercise, knee bracing and/or weight loss.   This patient has experienced inadequate response or has a contraindication to intra articular steroid injections for at least 3 months.   This patient is not scheduled to have a total knee replacement within 6 months of starting treatment with viscosupplementation.  Ultrasound obtained of bilateral hands today showed enlargement of bilateral median nerves.  She has been having tingling and paresthesias in her bilateral hands.  I will refer her to Dr. Ernestina Patches for nerve conduction velocities. Bo Merino, MD

## 2019-05-29 NOTE — Patient Outreach (Signed)
[facility]) Care Management  05/29/2019  Veronica Shaw [birth year] [id number]  Incoming call received from patient regarding a question she had about 2021 patient assistance.  Spoke to patient, HIPAA identifiers verified.  Patient informed we had helped her with [employer] patient assistance in 2020 and 2019. She informed she had received a re enrollment packet from [employer] and wanted to know what she should do with it.  Informed patient that our manager [facility] RPh Karrie Meres was establishing a process for [place] to follow for 2021 patient assistance and informed her to hold on to the forms for now and we should be outreaching her in the next several weeks as it was too early to reapply right now. Patient verbalized understanding.  [name], [facility] [phone]

## 2019-05-30 ENCOUNTER — Ambulatory Visit (INDEPENDENT_AMBULATORY_CARE_PROVIDER_SITE_OTHER): Payer: Medicare Other | Admitting: *Deleted

## 2019-05-30 DIAGNOSIS — I428 Other cardiomyopathies: Secondary | ICD-10-CM

## 2019-05-30 DIAGNOSIS — I5022 Chronic systolic (congestive) heart failure: Secondary | ICD-10-CM

## 2019-05-31 ENCOUNTER — Ambulatory Visit (INDEPENDENT_AMBULATORY_CARE_PROVIDER_SITE_OTHER): Payer: Medicare Other

## 2019-05-31 DIAGNOSIS — Z95810 Presence of automatic (implantable) cardiac defibrillator: Secondary | ICD-10-CM

## 2019-05-31 DIAGNOSIS — I5022 Chronic systolic (congestive) heart failure: Secondary | ICD-10-CM | POA: Diagnosis not present

## 2019-05-31 LAB — CUP PACEART REMOTE DEVICE CHECK
Battery Remaining Longevity: 47 mo
Battery Voltage: 2.96 V
Brady Statistic AP VP Percent: 0 %
Brady Statistic AP VS Percent: 0.15 %
Brady Statistic AS VP Percent: 0.03 %
Brady Statistic AS VS Percent: 99.81 %
Brady Statistic RA Percent Paced: 0.16 %
Brady Statistic RV Percent Paced: 0.04 %
Date Time Interrogation Session: 20201029232507
HighPow Impedance: 74 Ohm
Implantable Lead Implant Date: 20150427
Implantable Lead Implant Date: 20150427
Implantable Lead Location: 753859
Implantable Lead Location: 753860
Implantable Lead Model: 5076
Implantable Lead Model: 6935
Implantable Pulse Generator Implant Date: 20150427
Lead Channel Impedance Value: 361 Ohm
Lead Channel Impedance Value: 4047 Ohm
Lead Channel Impedance Value: 4047 Ohm
Lead Channel Impedance Value: 4047 Ohm
Lead Channel Impedance Value: 532 Ohm
Lead Channel Impedance Value: 589 Ohm
Lead Channel Pacing Threshold Amplitude: 0.5 V
Lead Channel Pacing Threshold Amplitude: 0.5 V
Lead Channel Pacing Threshold Pulse Width: 0.4 ms
Lead Channel Pacing Threshold Pulse Width: 0.4 ms
Lead Channel Sensing Intrinsic Amplitude: 12.125 mV
Lead Channel Sensing Intrinsic Amplitude: 12.125 mV
Lead Channel Sensing Intrinsic Amplitude: 2 mV
Lead Channel Sensing Intrinsic Amplitude: 2 mV
Lead Channel Setting Pacing Amplitude: 2 V
Lead Channel Setting Pacing Amplitude: 2.5 V
Lead Channel Setting Pacing Pulse Width: 0.4 ms
Lead Channel Setting Sensing Sensitivity: 0.3 mV

## 2019-05-31 NOTE — Progress Notes (Signed)
EPIC Encounter for ICM Monitoring  Patient Name: Veronica Shaw is a 68 y.o. female Date: 05/31/2019 Primary Care Physican: Caroline More, DO Primary Cardiologist:Cooper/Weaver PA Electrophysiologist: Lovena Le 05/24/2019 Weight: 143 lbs  Spoke with patient and heart failure questions reviewed.She said she is doing good.   OptiVolThoracic impedancenormal.  Prescribed:Furosemide40 mgTake 0.5 tablets (20 mg total) by mouth every other day as needed for fluid (For swelling).   Labs: 01/30/2019 Creatinine1.44[name], Potassium4.5, [id number], [id number] A complete set of results can be found in Results Review.  Recommendations: No changes and encouraged to call if experiencing any fluid symptoms.  Follow-up plan: ICM clinic phone appointment on 07/08/2019.   91 day device clinic remote transmission 08/29/2019.  Office appt 06/21/2019 with Tommye Standard, PA.    Copy of ICM check sent to Dr. Lovena Le.   3 month ICM trend: 05/29/2019    1 Year ICM trend:       [name], RN 05/31/2019 4:23 PM

## 2019-06-04 ENCOUNTER — Telehealth: Payer: Self-pay | Admitting: Rheumatology

## 2019-06-04 NOTE — Telephone Encounter (Signed)
Patient called stating she felt "very sleepy" after she received her Euflexxa injection last Wednesday, 05/29/19.  Patient states she is scheduled for her 2nd injection tomorrow and requesting a return call to discuss possible side effects.

## 2019-06-04 NOTE — Telephone Encounter (Signed)
Fatigue and drowsiness are not a common side effect of Euflexxa.

## 2019-06-04 NOTE — Telephone Encounter (Signed)
Patient advised fatigue and drowsiness are not a common side effect of Euflexxa. Patient states she "thinks she may be coming down with a cold". Patient has runny nose sneezing and just not feeling well. Patient advised we would cancel her appointment for tomorrow and she should contact PCP.

## 2019-06-05 ENCOUNTER — Ambulatory Visit: Payer: Medicare Other | Admitting: Physician Assistant

## 2019-06-06 ENCOUNTER — Other Ambulatory Visit: Payer: Self-pay

## 2019-06-06 ENCOUNTER — Telehealth (INDEPENDENT_AMBULATORY_CARE_PROVIDER_SITE_OTHER): Payer: Medicare Other | Admitting: Family Medicine

## 2019-06-06 ENCOUNTER — Encounter: Payer: Self-pay | Admitting: Family Medicine

## 2019-06-06 DIAGNOSIS — R0982 Postnasal drip: Secondary | ICD-10-CM

## 2019-06-06 MED ORDER — FLONASE SENSIMIST 27.5 MCG/SPRAY NA SUSP: 2.0000 | Freq: Every day | NASAL | 1 refills | Status: DC

## 2019-06-06 NOTE — Assessment & Plan Note (Signed)
Patient endorses several week period of "post nasal drip" that predates her now sore throat and some pain in her ears.  Patient denies using any allergy medication currently, though states she did in the past.   Plan: -We will prescribe Flonase Sensimist to see if this is easier to use than the patient's prior Flonase -Have scheduled follow-up office appointment for 2 weeks to further investigate patient's symptoms

## 2019-06-06 NOTE — Progress Notes (Signed)
[place] Telemedicine Visit  Patient consented to have virtual visit. Method of visit: Telephone  Encounter participants: Patient: Veronica Shaw - located at her home Provider: Lurline Del - located at [facility] Others (if applicable): None  Chief Complaint: Sore throat and fatigue  HPI:  Patient states that for several months she has had a "postnasal drip".  She states that her nose runs like "a faucet" and that when she tilts her head forward she has clear drainage.  She states she can feel it drain down her throat at night and that for the past 1-2 months she has also noted a sore throat she thinks is due to this.  She says she is checks her temperature every day due to fear of COVID-19 and has not had any temperatures above 98 degrees.  She has also developed a cough of the past few months.  She denies shortness of breath, change in taste, change in smell, recent travel, myalgias, sick contacts.  She states due to fear of COVID-19 her and her husband never leave the home and that her family brings her groceries typically.  She denies any sick contacts among her family.  She states she had tried Flonase in the past and many years ago had used Claritin but have not used either of these recently.  She states she is unsure if she is using her Flonase correctly.  She states that for the past few weeks she has also developed some pain in her ears and that for the past 20 years or so she has had "fluid in her ears" from time to time.  She states that she does have certain periods of the year where she gets more postnasal drip and cough than others, and that she typically has symptoms like these in the spring.  She states she does not like taking any additional medications unless absolutely necessary.  ROS: per HPI  Pertinent PMHx: Asthma, Parkinson's disease  Exam:  Respiratory: No shortness of breath or respiratory distress noted while listening to patient through the  telephone.  Assessment/Plan:  Post-nasal drip Patient endorses several week period of "post nasal drip" that predates her now sore throat and some pain in her ears.  Patient denies using any allergy medication currently, though states she did in the past.   Plan: -We will prescribe Flonase Sensimist to see if this is easier to use than the patient's prior Flonase -Have scheduled follow-up office appointment for 2 weeks to further investigate patient's symptoms     Patient plans to return sooner if symptoms worsen or if she develops any fever or shortness of breath.  Time spent during visit with patient: 61 minutes   Lurline Del, DO   Patient ID: Veronica Shaw, female   DOB: [birth year], 68 y.o.   MRN: [id number]

## 2019-06-06 NOTE — Patient Instructions (Addendum)
It was great to see you!  Our plans for today:  -I have prescribed Flonase Sensimist to use instead of the regular Flonase as this should be easier to use -We will schedule a follow-up visit for 2 weeks from now to reevaluate symptoms -If symptoms worsen before now and are follow-up appointment please come in sooner  Take care and seek immediate care sooner if you develop any concerns.   Dr. [facility]

## 2019-06-12 ENCOUNTER — Other Ambulatory Visit: Payer: Self-pay

## 2019-06-12 ENCOUNTER — Ambulatory Visit (INDEPENDENT_AMBULATORY_CARE_PROVIDER_SITE_OTHER): Payer: Medicare Other | Admitting: Rheumatology

## 2019-06-12 DIAGNOSIS — M170 Bilateral primary osteoarthritis of knee: Secondary | ICD-10-CM | POA: Diagnosis not present

## 2019-06-12 MED ORDER — LIDOCAINE HCL 1 % IJ SOLN
1.5000 mL | INTRAMUSCULAR | Status: AC | PRN
  Administered 2019-06-12: 1.5 mL

## 2019-06-12 MED ORDER — SODIUM HYALURONATE (VISCOSUP) 20 MG/2ML IX SOSY
20.0000 mg | PREFILLED_SYRINGE | INTRA_ARTICULAR | Status: AC | PRN
  Administered 2019-06-12: 20 mg via INTRA_ARTICULAR

## 2019-06-12 NOTE — Progress Notes (Signed)
   Procedure Note  Patient: Veronica Shaw             Date of Birth: [birth year]           MRN: [id number]             Visit Date: 06/12/2019  Procedures: Visit Diagnoses:  1. Primary osteoarthritis of both knees    Euflexxa #2 Bilateral knee joint injections B/B  Large Joint Inj: bilateral knee on 06/12/2019 9:12 AM Indications: pain Details: 27 G 1.5 in needle, medial approach  Arthrogram: No  Medications (Right): 20 mg Sodium Hyaluronate 20 MG/2ML; 1.5 mL lidocaine 1 % Aspirate (Right): 0 mL Medications (Left): 20 mg Sodium Hyaluronate 20 MG/2ML; 1.5 mL lidocaine 1 % Aspirate (Left): 0 mL Outcome: tolerated well, no immediate complications Procedure, treatment alternatives, risks and benefits explained, specific risks discussed. Consent was given by the patient. Immediately prior to procedure a time out was called to verify the correct patient, procedure, equipment, support staff and site/side marked as required. Patient was prepped and draped in the usual sterile fashion.      Patient tolerated the procedure well.  Bo Merino, MD

## 2019-06-13 ENCOUNTER — Ambulatory Visit: Payer: Self-pay | Admitting: Pharmacist

## 2019-06-17 ENCOUNTER — Other Ambulatory Visit: Payer: Self-pay | Admitting: Family Medicine

## 2019-06-18 ENCOUNTER — Other Ambulatory Visit: Payer: Self-pay

## 2019-06-18 ENCOUNTER — Encounter: Payer: Self-pay | Admitting: Physical Medicine and Rehabilitation

## 2019-06-18 ENCOUNTER — Ambulatory Visit (INDEPENDENT_AMBULATORY_CARE_PROVIDER_SITE_OTHER): Payer: Medicare Other | Admitting: Physical Medicine and Rehabilitation

## 2019-06-18 DIAGNOSIS — M79641 Pain in right hand: Secondary | ICD-10-CM

## 2019-06-18 DIAGNOSIS — M79642 Pain in left hand: Secondary | ICD-10-CM | POA: Diagnosis not present

## 2019-06-18 DIAGNOSIS — R202 Paresthesia of skin: Secondary | ICD-10-CM

## 2019-06-18 DIAGNOSIS — M5010 Cervical disc disorder with radiculopathy, unspecified cervical region: Secondary | ICD-10-CM

## 2019-06-18 NOTE — Progress Notes (Signed)
Veronica Shaw - 68 y.o. female MRN [id number]  Date of birth: [birth year]  Office Visit Note: Visit Date: 06/18/2019 PCP: Caroline More, DO Referred by: Caroline More, DO  Subjective: Chief Complaint  Patient presents with  . Right Hand - Pain, Numbness  . Left Hand - Pain, Numbness   HPI: Veronica Shaw is a 68 y.o. female who comes in today At the request of Dr. Bo Merino for electrodiagnostic study and evaluation management of bilateral hand pain with numbness and tingling.  Her case is complicated by history of Parkinson's disease, arthritis as well as a cervical disc disorder.  She was referred to Dr. Joya Salm in the spring of this year and CT scan of the cervical spine was performed which is reviewed below.  Dr. Estanislado Pandy is followed her for arthritis pain rheumatology work-up.  She has follow-up tomorrow with them.  Ultrasound was done of the wrist bilaterally showing some increase in the cross-sectional area of the median nerves.  She reports bilateral hand pain pain in the knees pain really all over.  No history of fibromyalgia but multiple medical complaints.  Seemingly some evidence of anxiety.  Numbness in the fingers is worse at night.  Right hand is worse than the left hand.  She does seem to get some symptoms with certain positioning and movement.  Today she is not really endorsing any radicular pain down the arms.  She is right-hand dominant.  She has not noted any focal weakness but feels like the right hand is weak at times.  She does have to shake her hands at times to get any relief.  Medication has been ineffectual.  She has had no specific trauma.  Review of Systems  Constitutional: Negative for chills, fever, malaise/fatigue and weight loss.  HENT: Negative for hearing loss and sinus pain.   Eyes: Negative for blurred vision, double vision and photophobia.  Respiratory: Negative for cough and shortness of breath.   Cardiovascular: Negative for chest pain,  palpitations and leg swelling.  Gastrointestinal: Negative for abdominal pain, nausea and vomiting.  Genitourinary: Negative for flank pain.  Musculoskeletal: Positive for back pain, joint pain and neck pain. Negative for myalgias.  Skin: Negative for itching and rash.  Neurological: Positive for tingling. Negative for tremors, focal weakness and weakness.  Endo/Heme/Allergies: Negative.   Psychiatric/Behavioral: Negative for depression.  All other systems reviewed and are negative.  Otherwise per HPI.  Assessment & Plan: Visit Diagnoses:  1. Paresthesia of skin   2. Cervical disc disorder with radiculopathy   3. Pain in right hand   4. Pain in left hand     Plan: Impression: The above electrodiagnostic study is ABNORMAL and reveals evidence of:  1.  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   2.  A moderate left median nerve entrapment at the wrist (carpal tunnel syndrome) affecting sensory and motor components.   There is no significant electrodiagnostic evidence of any other focal nerve entrapment, brachial plexopathy or cervical radiculopathy.    Recommendations: 1.  Follow-up with referring physician. 2.  Continue current management of symptoms. 3.  Continue use of resting splint at night-time and as needed during the day. 4.  Suggest surgical evaluation. I did not make referral since she was seeing you tomorrow.  Meds & Orders: No orders of the defined types were placed in this encounter.  No orders of the defined types were placed in this encounter.  Follow-up: Return for Has follow-up with Hazel Sams, PA-C.   Procedures: No procedures performed  No notes on file   Clinical History: Ultrasound of the bilateral hands 05/29/2019 Ultrasound obtained of bilateral hands today showed enlargement of bilateral median nerves. She has been having tingling and paresthesias in her bilateral hands.    Bo Merino, MD  ---  CT CERVICAL SPINE WITHOUT CONTRAST  TECHNIQUE: Multidetector CT imaging of the cervical spine was performed without intravenous contrast. Multiplanar CT image reconstructions were also generated.  COMPARISON:  MR cervical spine 04/10/2012  FINDINGS: Alignment: Loss of the normal cervical lordosis with straightening. 1-2 mm anterolisthesis of C3 on C4.  Skull base and vertebrae: No acute fracture. No aggressive osseous lesion. No bone destruction. Chronic type 2 dens fracture with the dens fused with the anterior arch of C1.  Soft tissues and spinal canal: No acute paraspinal abnormality.  Disc levels: Degenerative disc disease with disc height loss at C5-6. Anterior bridging osteophytes at C3-4, C4-5, C5-6, C6-7 and C7-T1.  C2-3: Broad-based disc bulge. Moderate right and mild left facet arthropathy. Mild right foraminal stenosis.  C3-4: Broad-based disc bulge. Severe right and mild left facet arthropathy. Mild bilateral foraminal stenosis.  C4-5: Broad-based disc osteophyte complex. Moderate right and mild left facet arthropathy. No foraminal stenosis.  C5-C6: Broad-based disc osteophyte complex eccentric towards the left. Left uncovertebral degenerative changes. Severe left foraminal stenosis. Mild bilateral facet arthropathy. No right foraminal stenosis.  C6-C7: Broad-based disc bulge.  No foraminal stenosis.  C7-T1: Broad-based disc bulge. Bilateral uncovertebral degenerative changes. Mild bilateral foraminal stenosis.  Upper chest: Lung apices are clear.  Other: No fluid collection or hematoma.  IMPRESSION: 1. Cervical spine spondylosis as described above.   Electronically Signed   By: Kathreen Devoid   On: 10/05/2018 11:54   She reports that she has never smoked. She has never used smokeless tobacco.  Recent Labs    09/24/18 0847 01/30/19 0941 03/05/19 0840  HGBA1C 7.1*  --  6.6  LABURIC  --  5.9  --      Objective:  VS:  HT:    WT:   BMI:     BP:   HR: bpm  TEMP: ( )  RESP:  Physical Exam Vitals signs and nursing note reviewed.  Constitutional:      General: She is not in acute distress.    Appearance: Normal appearance. She is well-developed.  HENT:     Head: Normocephalic and atraumatic.     Nose: Nose normal.     Mouth/Throat:     Mouth: Mucous membranes are moist.     Pharynx: Oropharynx is clear.  Eyes:     Conjunctiva/sclera: Conjunctivae normal.     Pupils: Pupils are equal, round, and reactive to light.  Neck:     Musculoskeletal: Normal range of motion and neck supple.  Cardiovascular:     Rate and Rhythm: Regular rhythm.  Pulmonary:     Effort: Pulmonary effort is normal. No respiratory distress.  Abdominal:     General: There is no distension.     Palpations: Abdomen is soft.     Tenderness: There is no guarding.  Musculoskeletal:        General: No swelling, tenderness or deformity.     Right lower leg: No edema.     Left lower leg: No edema.     Comments: Inspection reveals mild atrophy of the right APB but no atrophy of the left APB or bilateral  FDI  or hand intrinsics.  There is osteoarthritic changes at the [facility] joints bilaterally.  There is no swelling, color changes, allodynia or dystrophic changes. There is 5 out of 5 strength in the bilateral wrist extension, finger abduction and long finger flexion. There is intact sensation to light touch in all dermatomal and peripheral nerve distributions.  There is a negative Hoffmann's test bilaterally.  Skin:    General: Skin is warm and dry.     Findings: No erythema or rash.  Neurological:     General: No focal deficit present.     Mental Status: She is alert and oriented to person, place, and time.     Motor: No weakness or abnormal muscle tone.     Coordination: Coordination normal.     Gait: Gait normal.  Psychiatric:        Mood and Affect: Mood normal.        Behavior: Behavior normal.        Thought  Content: Thought content normal.     Ortho Exam Imaging: No results found.  Past Medical/Family/Surgical/Social History: Medications & Allergies reviewed per EMR, new medications updated. Patient Active Problem List   Diagnosis Date Noted  . Post-nasal drip 06/06/2019  . Hyperlipidemia associated with type 2 diabetes mellitus ([place]) 03/05/2019  . Contact dermatitis 03/05/2019  . Cervical disc disorder with radiculopathy of cervical region 09/24/2018  . Cognitive impairment 10/19/2017  . Osteopenia 07/10/2015  . Mild persistent asthma in adult without complication [id number]  . NICM (nonischemic cardiomyopathy) ([place]) 04/23/2014  . Arthritis or polyarthritis, rheumatoid ([place]) 03/12/2014  . ICD (implantable cardioverter-defibrillator), dual, in situ 03/06/2014  . Thoracic or lumbosacral neuritis or radiculitis, unspecified 07/03/2013  . Chronic systolic heart failure ([place]) 05/31/2013  . Parkinson disease ([place]) 05/21/2013  . Major depression 03/31/2013  . Vertigo 03/03/2013  . GERD (gastroesophageal reflux disease) 09/27/2012  . Plantar fasciitis, bilateral 06/15/2012  . 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 Diabetic peripheral neuropathy ([place]) 12/09/2011  . Hyperlipidemia 12/09/2011  . Sleep apnea 12/09/2011  . Chronic urticaria 05/27/2011  . 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 05/20/2011  . ROSACEA 05/29/2009  . ACHILLES BURSITIS OR TENDINITIS 03/27/2009  . Type II diabetes mellitus with complication ([place]) [id number]  . Essential hypertension 09/28/2006   Past Medical History:  Diagnosis Date  . Asthma   . Chronic systolic CHF (congestive heart failure) ([place])    a. cMRI 4/15: EF 34% and findings - c/w NICM, normal RV size and function (RVEF 61%), Mild MR // b. Echo 2/15:  EF 30-35%, diff HK, ant-sept AK, Gr 2 DD, mild MR, trivial TR  //  c. Echo 5/17: EF 20-25%, severe diffuse HK, marked systolic dyssynchrony, grade 1 diastolic dysfunction, mild MR  //  d. RHC 5/17: Fick CO 2.9, RVSP 19, PASP 15,  PW mean 2, low filing pressures and preserved CO   . Diabetes mellitus   . Flu 10/17/2017  . Gastritis   . History of echocardiogram    Echo 6/18: EF 30-35, diffuse HK, grade 1 diastolic dysfunction, trivial MR, mild LAE, mild TR, no pericardial effusion  . History of nuclear stress test    Myoview 5/18: EF 49, no ischemia, inferoseptal defect c/w LBBB artifact (intermediate risk due to EF < 50).  [name] Kitchen HTN (hypertension)   . Hyperlipidemia   . NICM (nonischemic cardiomyopathy) ([place])    a. Nuclear 5/13: Normal stress nuclear study. LV Ejection Fraction: 58%  //  b. LHC 10/14: Minor luminal irregularity in prox LAD, EF 35%   .  Parkinson disease ([place])   . Plantar fasciitis   . Sleep apnea    was retested and no longer had it and so d/c CPAP  . Urticaria    Family History  Problem Relation Age of Onset  . Coronary artery disease Father        Died age 70  . Heart attack Father   . Diabetes Father   . Coronary artery disease Mother        Died age 77  . Heart attack Mother   . Diabetes Mother   . Parkinson's disease Sister   . Heart disease Sister   . Hepatitis C Sister   . Diabetes Sister   . Diabetes Son 37       T1DM  . Healthy Daughter   . Diabetes Sister   . Heart disease Sister   . Diabetes Sister   . Heart disease Sister   . Breast cancer Neg Hx    Past Surgical History:  Procedure Laterality Date  . BREAST SURGERY Left   . BUNIONECTOMY    . CARDIAC CATHETERIZATION N/A 12/16/2015   Procedure: Right Heart Cath;  Surgeon: Sherren Mocha, MD;  Location: [facility];  Service: Cardiovascular;  Laterality: N/A;  . CHOLECYSTECTOMY    . eye lid surgery Bilateral 05/09/2019  . IMPLANTABLE CARDIOVERTER DEFIBRILLATOR IMPLANT  11-25-13   MDT dual chamber ICD implanted by [name] for primary prevention  . IMPLANTABLE CARDIOVERTER DEFIBRILLATOR IMPLANT N/A 11/25/2013   Procedure: IMPLANTABLE CARDIOVERTER DEFIBRILLATOR IMPLANT;  Surgeon: [name], MD;  Location: [facility]  CATH LAB;  Service: Cardiovascular;  Laterality: N/A;  . LEFT AND RIGHT HEART CATHETERIZATION WITH CORONARY ANGIOGRAM N/A 05/31/2013   Procedure: LEFT AND RIGHT HEART CATHETERIZATION WITH CORONARY ANGIOGRAM;  Surgeon: [name], MD;  Location: [facility] CATH LAB;  Service: Cardiovascular;  Laterality: N/A;  . TONSILLECTOMY    . TUBAL LIGATION     Social History   Occupational History  . Occupation: retired    Comment: CNA  Tobacco Use  . Smoking status: Never Smoker  . Smokeless tobacco: Never Used  . Tobacco comment: exposed to smoke during child hood (parents)  Substance and Sexual Activity  . Alcohol use: No    Alcohol/week: 0.0 standard drinks  . Drug use: No  . Sexual activity: Yes

## 2019-06-18 NOTE — Progress Notes (Signed)
Bilateral hand pain. Numbness in fingers. Worse at night. Right hand is worse than left. Right hand dominant.  Numeric Pain Rating Scale and Functional Assessment Average Pain 7   In the last MONTH (on 0-10 scale) has pain interfered with the following?  1. General activity like being  able to carry out your everyday physical activities such as walking, climbing stairs, carrying groceries, or moving a chair?  Rating(4)

## 2019-06-19 ENCOUNTER — Ambulatory Visit (INDEPENDENT_AMBULATORY_CARE_PROVIDER_SITE_OTHER): Payer: Medicare Other | Admitting: Rheumatology

## 2019-06-19 DIAGNOSIS — M170 Bilateral primary osteoarthritis of knee: Secondary | ICD-10-CM

## 2019-06-19 DIAGNOSIS — G5603 Carpal tunnel syndrome, bilateral upper limbs: Secondary | ICD-10-CM

## 2019-06-19 MED ORDER — SODIUM HYALURONATE (VISCOSUP) 20 MG/2ML IX SOSY
20.0000 mg | PREFILLED_SYRINGE | INTRA_ARTICULAR | Status: AC | PRN
  Administered 2019-06-19: 20 mg via INTRA_ARTICULAR

## 2019-06-19 MED ORDER — LIDOCAINE HCL 1 % IJ SOLN
1.5000 mL | INTRAMUSCULAR | Status: AC | PRN
  Administered 2019-06-19: 1.5 mL

## 2019-06-19 NOTE — Addendum Note (Signed)
Addended by: Ofilia Neas on: 06/19/2019 10:10 AM   Modules accepted: Orders

## 2019-06-19 NOTE — Procedures (Signed)
EMG & NCV Findings: Evaluation of the left median motor nerve showed decreased conduction velocity (Elbow-Wrist, 49 m/s).  The right median motor nerve showed prolonged distal onset latency (6.6 ms), reduced amplitude (4.4 mV), and decreased conduction velocity (Elbow-Wrist, 39 m/s).  The left median (across palm) sensory nerve showed prolonged distal peak latency (Wrist, 4.3 ms).  The right median (across palm) sensory nerve showed prolonged distal peak latency (Wrist, 5.9 ms), reduced amplitude (9.2 V), and prolonged distal peak latency (Palm, 2.6 ms).  The right ulnar sensory nerve showed prolonged distal peak latency (3.8 ms) and decreased conduction velocity (Wrist-5th Digit, 37 m/s).  All remaining nerves (as indicated in the following tables) were within normal limits.  Left vs. Right side comparison data for the median motor nerve indicates abnormal L-R latency difference (2.5 ms) and abnormal L-R velocity difference (Elbow-Wrist, 10 m/s).  All remaining left vs. right side differences were within normal limits.    All examined muscles (as indicated in the following table) showed no evidence of electrical instability.    Impression: The above electrodiagnostic study is ABNORMAL and reveals evidence of:  1.  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   2.  A moderate left median nerve entrapment at the wrist (carpal tunnel syndrome) affecting sensory and motor components.   There is no significant electrodiagnostic evidence of any other focal nerve entrapment, brachial plexopathy or cervical radiculopathy.    Recommendations: 1.  Follow-up with referring physician. 2.  Continue current management of symptoms. 3.  Continue use of resting splint at night-time and as needed during the day. 4.  Suggest surgical evaluation. I did not make referral since she was seeing you tomorrow.  ___________________________ Laurence Spates FAAPMR Board Certified, American Board of Physical Medicine and Rehabilitation    Nerve Conduction Studies Anti Sensory Summary Table   Stim Site NR Peak (ms) Norm Peak (ms) P-T Amp (V) Norm P-T Amp Site1 Site2 Delta-P (ms) Dist (cm) Vel (m/s) Norm Vel (m/s)  Left Median Acr Palm Anti Sensory (2nd Digit)  30.2C  Wrist    *4.3 <3.6 24.0 >10 Wrist Palm 2.4 0.0    Palm    1.9 <2.0 17.8         Right Median Acr Palm Anti Sensory (2nd Digit)  29.7C  Wrist    *5.9 <3.6 *9.2 >10 Wrist Palm 3.3 0.0    Palm    *2.6 <2.0 10.7         Left Radial Anti Sensory (Base 1st Digit)  30.5C  Wrist    2.4 <3.1 17.6  Wrist Base 1st Digit 2.4 0.0    Right Radial Anti Sensory (Base 1st Digit)  29.9C  Wrist    2.6 <3.1 20.5  Wrist Base 1st Digit 2.6 0.0    Left Ulnar Anti Sensory (5th Digit)  31C  Wrist    3.6 <3.7 18.9 >15.0 Wrist 5th Digit 3.6 14.0 39 >38  Right Ulnar Anti Sensory (5th Digit)  30.1C  Wrist    *3.8 <3.7 30.3 >15.0 Wrist 5th Digit 3.8 14.0 *37 >38   Motor Summary Table   Stim Site NR Onset (ms) Norm Onset (ms) O-P Amp (mV) Norm O-P Amp Site1 Site2 Delta-0 (ms) Dist (cm) Vel (m/s) Norm Vel (m/s)  Left Median Motor (Abd Poll Brev)  30.8C  Wrist    4.1 <4.2 7.6 >5 Elbow Wrist 3.6 17.5 *49 >50  Elbow    7.7  2.8         Right Median Motor (Abd Poll Brev)  29.9C  Wrist    *6.6 <4.2 *4.4 >5 Elbow Wrist 4.7 18.3 *39 >50  Elbow    11.3  2.2         Left Ulnar Motor (Abd Dig Min)  30.9C  Wrist    2.9 <4.2 9.0 >3 B Elbow Wrist 3.0 17.0 57 >53  B Elbow    5.9  6.7  A Elbow B Elbow 1.1 9.0 82 >53  A Elbow    7.0  7.0         Right Ulnar Motor (Abd Dig Min)  30C  Wrist    3.0 <4.2 8.6 >3 B Elbow Wrist 3.0 17.0 57 >53  B Elbow    6.0  8.4  A Elbow B Elbow 1.3 9.5 73 >53  A Elbow    7.3  8.3          EMG   Side Muscle Nerve Root Ins Act Fibs Psw Amp Dur Poly Recrt Int Fraser Din Comment  Right Abd Poll Brev Median C8-T1 Nml Nml Nml Nml Nml 0 Nml Nml   Right 1stDorInt Ulnar C8-T1  Nml Nml Nml Nml Nml 0 Nml Nml   Right PronatorTeres Median C6-7 Nml Nml Nml Nml Nml 0 Nml Nml   Right Biceps Musculocut C5-6 Nml Nml Nml Nml Nml 0 Nml Nml   Right Deltoid Axillary C5-6 Nml Nml Nml Nml Nml 0 Nml Nml     Nerve Conduction Studies Anti Sensory Left/Right Comparison   Stim Site L Lat (ms) R Lat (ms) L-R Lat (ms) L Amp (V) R Amp (V) L-R Amp (%) Site1 Site2 L Vel (m/s) R Vel (m/s) L-R Vel (m/s)  Median Acr Palm Anti Sensory (2nd Digit)  30.2C  Wrist *4.3 *5.9 1.6 24.0 *9.2 61.7 Wrist Palm     Palm 1.9 *2.6 0.7 17.8 10.7 39.9       Radial Anti Sensory (Base 1st Digit)  30.5C  Wrist 2.4 2.6 0.2 17.6 20.5 14.1 Wrist Base 1st Digit     Ulnar Anti Sensory (5th Digit)  31C  Wrist 3.6 *3.8 0.2 18.9 30.3 37.6 Wrist 5th Digit 39 *37 2   Motor Left/Right Comparison   Stim Site L Lat (ms) R Lat (ms) L-R Lat (ms) L Amp (mV) R Amp (mV) L-R Amp (%) Site1 Site2 L Vel (m/s) R Vel (m/s) L-R Vel (m/s)  Median Motor (Abd Poll Brev)  30.8C  Wrist 4.1 *6.6 *2.5 7.6 *4.4 42.1 Elbow Wrist *49 *39 *10  Elbow 7.7 11.3 3.6 2.8 2.2 21.4       Ulnar Motor (Abd Dig Min)  30.9C  Wrist 2.9 3.0 0.1 9.0 8.6 4.4 B Elbow Wrist 57 57 0  B Elbow 5.9 6.0 0.1 6.7 8.4 20.2 A Elbow B Elbow 82 73 9  A Elbow 7.0 7.3 0.3 7.0 8.3 15.7          Waveforms:

## 2019-06-19 NOTE — Progress Notes (Signed)
Cardiology Office Note Date:  06/21/2019  Patient ID:  Veronica Shaw, Veronica Shaw Aug 29, 1950, MRN [id number] PCP:  Caroline More, DO  Cardiologist:  Dr. Burt Knack Electrophysiologist: Dr. Lovena Le Pulmonologist: Dr. Lake Bells Neurologist: Dr. Carles Collet   Chief Complaint:  6 mo visit  History of Present Illness: Veronica Shaw is a 68 y.o. female with history of OA, DDD, spondylosis, spinal stenosis, arthritis, HTN, HLD, DM, parkinson disease, NICM, chronic CHF (systolic >> diastolic), ICD, OSA, mild persistent asthma, ?COPD  Saw orthopedics recently  for C-spine radiculopathy, planned for [place] b/l hands and follow up  She comes in today to be seen for Dr. [name].  Last seen by him in May 2020 via virtual visit, doing well, class II symptoms, no changes were made. March 2019, her last echo noted recovered LVEF 60-65%, grade II DD.   She is doing fairly well, denies any CP, cardiac awareness, palpitations, no dizziness, near syncope or syncope.  She has some baseline SOB/DOE that is unchanged, follows with her pulmonologist.   She does not exercise necessarily, but will do some laps in her home to keep moving periodically.  She denies any difficulties with her ADLs  No shocks   Device information MDT dual chamber ICD, (LV port plugged), implanted 11/25/13   Past Medical History:  Diagnosis Date  . Asthma   . Chronic systolic CHF (congestive heart failure) ([place])    a. cMRI 4/15: EF 34% and findings - c/w NICM, normal RV size and function (RVEF 61%), Mild MR // b. Echo 2/15:  EF 30-35%, diff HK, ant-sept AK, Gr 2 DD, mild MR, trivial TR  //  c. Echo 5/17: EF 20-25%, severe diffuse HK, marked systolic dyssynchrony, grade 1 diastolic dysfunction, mild MR  //  d. RHC 5/17: Fick CO 2.9, RVSP 19, PASP 15, PW mean 2, low filing pressures and preserved CO   . Diabetes mellitus   . Flu 10/17/2017  . Gastritis   . History of echocardiogram    Echo 6/18: EF 30-35, diffuse HK, grade 1 diastolic dysfunction,  trivial MR, mild LAE, mild TR, no pericardial effusion  . History of nuclear stress test    Myoview 5/18: EF 49, no ischemia, inferoseptal defect c/w LBBB artifact (intermediate risk due to EF < 50).  [name] Kitchen HTN (hypertension)   . Hyperlipidemia   . NICM (nonischemic cardiomyopathy) ([place])    a. Nuclear 5/13: Normal stress nuclear study. LV Ejection Fraction: 58%  //  b. LHC 10/14: Minor luminal irregularity in prox LAD, EF 35%   . Parkinson disease ([place])   . Plantar fasciitis   . Sleep apnea    was retested and no longer had it and so d/c CPAP  . Urticaria     Past Surgical History:  Procedure Laterality Date  . BREAST SURGERY Left   . BUNIONECTOMY    . CARDIAC CATHETERIZATION N/A 12/16/2015   Procedure: Right Heart Cath;  Surgeon: Sherren Mocha, MD;  Location: [facility];  Service: Cardiovascular;  Laterality: N/A;  . CHOLECYSTECTOMY    . eye lid surgery Bilateral 05/09/2019  . IMPLANTABLE CARDIOVERTER DEFIBRILLATOR IMPLANT  11-25-13   MDT dual chamber ICD implanted by [name] for primary prevention  . IMPLANTABLE CARDIOVERTER DEFIBRILLATOR IMPLANT N/A 11/25/2013   Procedure: IMPLANTABLE CARDIOVERTER DEFIBRILLATOR IMPLANT;  Surgeon: [name], MD;  Location: [facility] C[facility] LAB;  Service: Cardiovascular;  Laterality: N/A;  . LEFT AND RIGHT HEART CATHETERIZATION WITH CORONARY ANGIOGRAM N/A 05/31/2013   Procedure: LEFT  AND RIGHT HEART CATHETERIZATION WITH CORONARY ANGIOGRAM;  Surgeon: [name], MD;  Location: [facility] CATH LAB;  Service: Cardiovascular;  Laterality: N/A;  . TONSILLECTOMY    . TUBAL LIGATION      Current Outpatient Medications  Medication Sig Dispense Refill  . albuterol (PROVENTIL HFA;VENTOLIN HFA) 108 (90 Base) MCG/ACT inhaler Inhale 2 puffs into the lungs every 4 (four) hours as needed for wheezing or shortness of breath. 18 g 3  . Biotin 5000 MCG CAPS Take 1 tablet by mouth daily.    . Calcium Carb-Cholecalciferol 600-800 MG-UNIT TABS Take 1 tablet by mouth  daily. 90 tablet 3  . carbidopa-levodopa (SINEMET IR) 25-100 MG tablet TAKE 1 TABLET BY MOUTH THREE TIMES DAILY 270 tablet 1  . famotidine (PEPCID) 20 MG tablet TAKE 1 TABLET TWICE DAILY 180 tablet 2  . fluticasone (FLONASE SENSIMIST) 27.5 MCG/SPRAY nasal spray Place 2 sprays into the nose daily. 10 g 1  . fluticasone (FLONASE) 50 MCG/ACT nasal spray Place 2 sprays into both nostrils daily. 16 g 6  . furosemide (LASIX) 40 MG tablet TAKE 1/2 TABLET EVERY DAY AS NEEDED FOR FLUID/SWELLING 30 tablet 3  . gabapentin (NEURONTIN) 100 MG capsule Take 300mg  (3 pills) at night before bed and 100mg  (1 pill) in the morning with breakfast. (Patient taking differently: as needed. Take 300mg  (3 pills) at night before bed and 100mg  (1 pill) in the morning with breakfast.) 120 capsule 1  . levalbuterol (XOPENEX) 0.63 MG/3ML nebulizer solution 1 vial every 4-6 hours as needed for wheezing and shortness of breath. 180 mL 2  . losartan (COZAAR) 25 MG tablet TAKE 1 TABLET EVERY DAY 90 tablet 2  . meclizine (ANTIVERT) 12.5 MG tablet Take 0.5 tablets (6.25 mg total) by mouth 3 (three) times daily as needed for dizziness. 30 tablet 0  . metFORMIN (GLUCOPHAGE) 500 MG tablet Take 1,000 mg by mouth 2 (two) times daily with a meal.    . methocarbamol (ROBAXIN) 750 MG tablet Take 750 mg by mouth as needed.     . metoprolol succinate (TOPROL-XL) 50 MG 24 hr tablet TAKE 1 TABLET TWICE DAILY 180 tablet 3  . mirtazapine (REMERON) 15 MG tablet Take 1 tablet (15 mg total) by mouth at bedtime. (Patient taking differently: Take 15 mg by mouth as needed. ) 90 tablet 1  . mometasone-formoterol (DULERA) 200-5 MCG/ACT AERO Inhale 2 puffs into the lungs 2 (two) times daily. 3 Inhaler 3  . potassium chloride (K-DUR,KLOR-CON) 10 MEQ tablet Take 1 tablet (10 mEq total) by mouth as needed (take with lasix as needed for swelling). 30 tablet 2  . pravastatin (PRAVACHOL) 20 MG tablet Take 1 tablet (20 mg total) by mouth at bedtime. 90 tablet 0  .  spironolactone (ALDACTONE) 25 MG tablet TAKE 1/2 TABLET EVERY DAY 45 tablet 2  . Turmeric 500 MG TABS Take 1 tablet by mouth daily.     No current facility-administered medications for this visit.     Allergies:   Aspirin, Penicillins, Prempro [conj estrog-medroxyprogest ace], Ace inhibitors, and Simvastatin   Social History:  The patient  reports that she has never smoked. She has never used smokeless tobacco. She reports that she does not drink alcohol or use drugs.   Family History:  The patient's family history includes Coronary artery disease in her father and mother; Diabetes in her father, mother, sister, sister, and sister; Diabetes (age of onset: 46) in her son; Healthy in her daughter; Heart attack in her father and  mother; Heart disease in her sister, sister, and sister; Hepatitis C in her sister; Parkinson's disease in her sister.  ROS:  Please see the history of present illness.  All other systems are reviewed and otherwise negative.   PHYSICAL EXAM:  VS:  BP (!) 142/66   Pulse 71   Ht 5' 1.5" (1.562 m)   Wt 141 lb (64 kg)   LMP 08/01/2000 (Approximate)   BMI 26.21 kg/m  BMI: Body mass index is 26.21 kg/m. Well nourished, well developed, in no acute distress  HEENT: normocephalic, atraumatic  Neck: no JVD, carotid bruits or masses Cardiac:  RRR; no significant murmurs, no rubs, or gallops Lungs:  CTA b/l, no wheezing, rhonchi or rales  Abd: soft, nontender MS: no deformity, age appropriate atrophy Ext: no edema  Skin: warm and dry, no rash Neuro:  No gross deficits appreciated Psych: euthymic mood, full affect  ICD site is stable, no tethering or discomfort   EKG:  Not done today ICD interrogation done today and reviewed by myself:  Battery and lead measurements are good No arrhythmias or therapipes     Echo 10/18/17 EF 60-65, normal wall motion, grade 2 diastolic dysfunction  Echo 01/11/17 EF 30-35, diffuse HK, grade 1 diastolic dysfunction, trivial MR,  mild LAE, mild TR  Nuclear stress test 12/29/16 Large size, moderate intensity fixed inferoseptal/septal perfusion defect consistent with LBBB artifact. No reversible ischemia. LVEF 49% with incoordinate septal motion. This is an intermediate risk study (due to LVEF <50).  RHC 12/16/15 CO 2.9; mean RA 1, PASP 15, mean PA 10, mean PCWP 2 1. Low intracardiac filling pressures 2. Preserved cardiac output  Echo 12/11/15 EF 20-25%, severe diffuse HK, marked systolic dyssynchrony, grade 1 diastolic dysfunction, mild MR  Cardiac MRI (4/15): 1. Mild LVE, EF 34%, Diff HK, paradoxical septal motion c/w LBBB, small focal area of late gadolinium enhancement at basal anteroseptum - may represent RV volume overload or a focal infiltrative disease such as sarcoidosis, but certainly wouldn't explain the degree of of LV dysfunction. - c/w NICM, normal RV size and function (RVEF 61%), Mild MR  LHC (10/14): Minor luminal irregularity in prox LAD, EF 35%  Echo (2/15): EF 30-35%, diff HK, ant-sept AK, Gr 2 DD, mild MR, trivial TR  Nuclear (5/13): Normal stress nuclear study. LV Ejection Fraction: 58%     Recent Labs: 08/15/2018: NT-Pro BNP 310 01/30/2019: ALT 6; Hemoglobin 13.3; Platelets 247 03/05/2019: BUN 24; Creatinine, Ser 1.31; Potassium 4.9; Sodium 144  03/05/2019: Chol/HDL Ratio 4.2; Cholesterol, Total 182; HDL 43; LDL Calculated 110; Triglycerides 144   CrCl cannot be calculated (Patient's most recent lab result is older than the maximum 21 days allowed.).   Wt Readings from Last 3 Encounters:  06/21/19 141 lb (64 kg)  05/24/19 143 lb 3.2 oz (65 kg)  03/08/19 135 lb (61.2 kg)     Other studies reviewed: Additional studies/records reviewed today include: summarized above  ASSESSMENT AND PLAN:  1. ICD     Intact function, no programming changes made  2. NICM 3. Chronic CHF (systolic >> Distolic)      Recovered LVEF by her last echo, noted grade II DD     No symptoms or  exam findings to suggest volume OL     Weight  Is stable     OptiVol looks good   4. HTN     No changes today    Disposition: Continue Q 3 mo device remotes, and see her bnack in clinic  for EP in a year, sooner if needed.  She is due to see Kathleen Argue, PA/Dr. Burt Knack in Jan.    Current medicines are reviewed at length with the patient today.  The patient did not have any concerns regarding medicines.  Venetia Night, PA-C 06/21/2019 8:36 AM     [address] [place] [address] [phone] (office)  [phone] (fax)

## 2019-06-19 NOTE — Progress Notes (Signed)
   Procedure Note  Patient: Veronica Shaw             Date of Birth: 07-11-1951           MRN: [id number]             Visit Date: 06/19/2019  Procedures: Visit Diagnoses:  1. Primary osteoarthritis of both knees    euflexxa #3 bilateral knees B/B Large Joint Inj: bilateral knee on 06/19/2019 9:09 AM Indications: pain Details: 27 G 1.5 in needle, medial approach  Arthrogram: No  Medications (Right): 20 mg Sodium Hyaluronate 20 MG/2ML; 1.5 mL lidocaine 1 % Aspirate (Right): 0 mL Medications (Left): 20 mg Sodium Hyaluronate 20 MG/2ML; 1.5 mL lidocaine 1 % Aspirate (Left): 0 mL Outcome: tolerated well, no immediate complications Procedure, treatment alternatives, risks and benefits explained, specific risks discussed. Consent was given by the patient. Immediately prior to procedure a time out was called to verify the correct patient, procedure, equipment, support staff and site/side marked as required. Patient was prepped and draped in the usual sterile fashion.    Bo Merino, MD

## 2019-06-20 ENCOUNTER — Ambulatory Visit: Payer: Medicare Other | Admitting: Family Medicine

## 2019-06-21 ENCOUNTER — Other Ambulatory Visit: Payer: Self-pay

## 2019-06-21 ENCOUNTER — Ambulatory Visit (INDEPENDENT_AMBULATORY_CARE_PROVIDER_SITE_OTHER): Payer: Medicare Other | Admitting: Physician Assistant

## 2019-06-21 VITALS — BP 142/66 | HR 71 | Ht 61.5 in | Wt 141.0 lb

## 2019-06-21 DIAGNOSIS — I5032 Chronic diastolic (congestive) heart failure: Secondary | ICD-10-CM | POA: Diagnosis not present

## 2019-06-21 DIAGNOSIS — I428 Other cardiomyopathies: Secondary | ICD-10-CM

## 2019-06-21 DIAGNOSIS — I5022 Chronic systolic (congestive) heart failure: Secondary | ICD-10-CM

## 2019-06-21 DIAGNOSIS — Z95810 Presence of automatic (implantable) cardiac defibrillator: Secondary | ICD-10-CM

## 2019-06-21 DIAGNOSIS — I10 Essential (primary) hypertension: Secondary | ICD-10-CM

## 2019-06-21 NOTE — Patient Instructions (Addendum)
Medication Instructions:   Your physician recommends that you continue on your current medications as directed. Please refer to the Current Medication list given to you today.  *If you need a refill on your cardiac medications before your next appointment, please call your pharmacy*  Lab Work: [place]   If you have labs (blood work) drawn today and your tests are completely normal, you will receive your results only by: [name] Kitchen MyChart Message (if you have MyChart) OR . A paper copy in the mail If you have any lab test that is abnormal or we need to change your treatment, we will call you to review the results.  Testing/Procedures: NONE ORDERED  TODAY'  Follow-Up: At [facility], you and your health needs are our priority.  As part of our continuing mission to provide you with exceptional heart care, we have created designated Provider Care Teams.  These Care Teams include your primary Cardiologist (physician) and Advanced Practice Providers (APPs -  Physician Assistants and Nurse Practitioners) who all work together to provide you with the care you need, when you need it.  Your next appointment:   1 year(s)  The format for your next appointment:   In Person  Provider:   You may see Cristopher Peru, MD  or one of the following Advanced Practice Providers on your designated Care Team:    Chanetta Marshall, NP  Tommye Standard, PA-C  Legrand Como "Oda Kilts, [place]   Other Instructions

## 2019-06-22 NOTE — Progress Notes (Signed)
Remote ICD transmission.   

## 2019-06-24 ENCOUNTER — Other Ambulatory Visit: Payer: Self-pay | Admitting: Neurology

## 2019-06-29 ENCOUNTER — Other Ambulatory Visit: Payer: Self-pay | Admitting: Cardiovascular Disease

## 2019-07-02 ENCOUNTER — Other Ambulatory Visit: Payer: Self-pay | Admitting: *Deleted

## 2019-07-02 NOTE — Patient Outreach (Signed)
[facility]) Care Management  07/02/2019  Veronica Shaw 10-07-1950 [id number]  RN Health Coach telephone call to patient.  Hipaa compliance verified. 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 Patient was moving from kitchen to living room and was short of breath. Patient asked that Health Coach call her back.  RN Health Coach will call patient back within 30 days  [place] Management [phone]

## 2019-07-05 ENCOUNTER — Other Ambulatory Visit: Payer: Self-pay | Admitting: Pharmacy Technician

## 2019-07-05 NOTE — Patient Outreach (Signed)
[place] [facility]) Care Management  07/05/2019  Veronica Shaw 07-Dec-1950 [id number]   Unsuccessful outreach attempt made to patient in regards to a voicemail she left me about a question she had about a form she received from a drug company.  Unfortunately patient did not answer the phone, HIPAA compliant voicemail left.  Patient was approved for [employer] in 2020 for [employer] and Proventil.  Will route note to [place] to review chart for clinical appropriateness for 2021 re enrollment purposes.  Will await return call from patient.  Ladarian Bonczek P. Arietta Eisenstein, [place] Management [phone]

## 2019-07-08 ENCOUNTER — Other Ambulatory Visit: Payer: Self-pay

## 2019-07-08 ENCOUNTER — Ambulatory Visit (INDEPENDENT_AMBULATORY_CARE_PROVIDER_SITE_OTHER): Payer: Medicare Other

## 2019-07-08 DIAGNOSIS — Z95810 Presence of automatic (implantable) cardiac defibrillator: Secondary | ICD-10-CM | POA: Diagnosis not present

## 2019-07-08 DIAGNOSIS — J4530 Mild persistent asthma, uncomplicated: Secondary | ICD-10-CM

## 2019-07-08 DIAGNOSIS — I5032 Chronic diastolic (congestive) heart failure: Secondary | ICD-10-CM | POA: Diagnosis not present

## 2019-07-08 MED ORDER — LEVALBUTEROL HCL 0.63 MG/3ML IN NEBU: INHALATION_SOLUTION | RESPIRATORY_TRACT | 2 refills | Status: DC

## 2019-07-09 ENCOUNTER — Other Ambulatory Visit: Payer: Self-pay | Admitting: *Deleted

## 2019-07-09 NOTE — Patient Outreach (Signed)
[place] [facility]) Care Management  [place]  07/09/2019   Veronica Shaw 1950-10-19 [id number]  RN Health Coach telephone call to patient.  Hipaa compliance verified. Per patient she is weighing daily and checking her blood pressure. Patient is trying to eat healthier. Patient stated she is having pain in her arm and have been diagnosed with Carpal Tunnel Syndrome. PER patient she was given a stationary bike and have been exercising on it. Patient is taking medications as per ordered. Patient stated her knees swell sometimes and hurt. Per patient the knee has given out and she has fallen on rug. She stated she now uses a came because of this. She has had several injections in them. Patient has agreed to further outreach calls.    Encounter Medications:  Outpatient Encounter Medications as of 07/09/2019  Medication Sig  . albuterol (PROVENTIL HFA;VENTOLIN HFA) 108 (90 Base) MCG/ACT inhaler Inhale 2 puffs into the lungs every 4 (four) hours as needed for wheezing or shortness of breath.  . Biotin 5000 MCG CAPS Take 1 tablet by mouth daily.  . Calcium Carb-Cholecalciferol 600-800 MG-UNIT TABS Take 1 tablet by mouth daily.  . carbidopa-levodopa (SINEMET IR) 25-100 MG tablet TAKE 1 TABLET BY MOUTH THREE TIMES DAILY  . famotidine (PEPCID) 20 MG tablet TAKE 1 TABLET TWICE DAILY  . fluticasone (FLONASE SENSIMIST) 27.5 MCG/SPRAY nasal spray Place 2 sprays into the nose daily.  . fluticasone (FLONASE) 50 MCG/ACT nasal spray Place 2 sprays into both nostrils daily.  . furosemide (LASIX) 40 MG tablet TAKE 1/2 TABLET EVERY DAY AS NEEDED FOR FLUID/SWELLING  . gabapentin (NEURONTIN) 100 MG capsule Take 300mg  (3 pills) at night before bed and 100mg  (1 pill) in the morning with breakfast. (Patient taking differently: as needed. Take 300mg  (3 pills) at night before bed and 100mg  (1 pill) in the morning with breakfast.)  . levalbuterol (XOPENEX) 0.63 MG/3ML nebulizer solution 1 vial every 4-6  hours as needed for wheezing and shortness of breath.  . losartan (COZAAR) 25 MG tablet TAKE 1 TABLET EVERY DAY  . meclizine (ANTIVERT) 12.5 MG tablet Take 0.5 tablets (6.25 mg total) by mouth 3 (three) times daily as needed for dizziness.  . metFORMIN (GLUCOPHAGE) 500 MG tablet Take 1,000 mg by mouth 2 (two) times daily with a meal.  . methocarbamol (ROBAXIN) 750 MG tablet Take 750 mg by mouth as needed.   . metoprolol succinate (TOPROL-XL) 50 MG 24 hr tablet TAKE 1 TABLET TWICE DAILY  . mirtazapine (REMERON) 15 MG tablet Take 1 tablet (15 mg total) by mouth at bedtime. (Patient taking differently: Take 15 mg by mouth as needed. )  . mometasone-formoterol (DULERA) 200-5 MCG/ACT AERO Inhale 2 puffs into the lungs 2 (two) times daily.  . potassium chloride (K-DUR,KLOR-CON) 10 MEQ tablet Take 1 tablet (10 mEq total) by mouth as needed (take with lasix as needed for swelling).  . pravastatin (PRAVACHOL) 20 MG tablet Take 1 tablet (20 mg total) by mouth at bedtime.  [name] Kitchen spironolactone (ALDACTONE) 25 MG tablet TAKE 1/2 TABLET EVERY DAY  . Turmeric 500 MG TABS Take 1 tablet by mouth daily.   No facility-administered encounter medications on file as of 07/09/2019.     Functional Status:  In your present state of health, do you have any difficulty performing the following activities: 07/09/2019 04/02/2019  Hearing? N N  Vision? Y Y  Comment - Patient stated he eyelids are droopy and eyes always puffy  Difficulty concentrating or making decisions?  Y (No Data)  Comment - per patient she is forgetful  Walking or climbing stairs? Y Y  Comment - pt has shortness of breath of on exertion  Dressing or bathing? N N  Doing errands, shopping? Y Y  Comment - family does most of the shopping. Patient is doing a little driving now  Conservation officer, nature and eating ? N N  Using the Toilet? N N  In the past six months, have you accidently leaked urine? Y Y  Comment - -  Do you have problems with loss of bowel control? N  N  Managing your Medications? N N  Managing your Finances? N N  Housekeeping or managing your Housekeeping? N N  Some recent data might be hidden    Fall/Depression Screening: Fall Risk  07/09/2019 04/02/2019 03/08/2019  Falls in the past year? 1 1 1   Comment - - -  Number falls in past yr: 1 1 1   Injury with Fall? 0 0 1  Comment - - -  Risk Factor Category  Moderate Risk (1 Point) Low Risk (0 Points) -  Risk for fall due to : History of fall(s);Impaired balance/gait;Impaired mobility Impaired balance/gait;Impaired mobility;Impaired vision;History of fall(s) -  Follow up Falls evaluation completed;Education provided;Falls prevention discussed Falls evaluation completed;Education provided;Falls prevention discussed -  Comment - - -   PHQ 2/9 Scores 07/09/2019 04/02/2019 03/05/2019 12/26/2018 11/19/2018 09/27/2018 09/24/2018  PHQ - 2 Score 0 0 0 0 0 0 0  PHQ- 9 Score - - - - - - -  Exception Documentation - - - - - - -   THN CM Care Plan Problem One     Most Recent Value  Care Plan Problem One  Knowledge Deficit in self management of chf  Role Documenting the Problem One  [place] for Problem One  Active  THN Long Term Goal   Patient will follow up on health maintenance within the next 90 days  THN Long Term Goal Start Date  07/09/19  Interventions for Problem One Long Term Goal  RN reiterated health maintenance. Patient is in need of carpal tunnel surgery but has beenputting it off. RN will follow up with further updating health maintenance care  [facility] CM Short Term Goal #1   Patient will not have a CHF exacerbation within the next 30 days  THN CM Short Term Goal #1 Start Date  07/09/19  Interventions for Short Term Goal #1  RN reiterates the CHF action plan and zones. RN discussed monitoring closely weight and for swelling. RNreiterated medication adherence.      Assessment:  Patient is weighing daily Patient is exercising on new stationary bike Patient is taking medications  as prescribed Patient has Carpal tunnel pain pending surgery Patient has had recent falls with no injuries Plan:  RN discussed fall prevention RN sent a 2021 Calendar book to document her blood pressures and weights RN discussed healthy eating RN reiterated the zones and action plan RN will follow up with next outreach in February  Veronica Shaw [facility] Management [phone]

## 2019-07-09 NOTE — Progress Notes (Deleted)
Virtual Visit via Video Note The purpose of this virtual visit is to provide medical care while limiting exposure to the novel coronavirus.    Consent was obtained for video visit:  {yes no:314532} Answered questions that patient had about telehealth interaction:  {yes no:314532} I discussed the limitations, risks, security and privacy concerns of performing an evaluation and management service by telemedicine. I also discussed with the patient that there may be a patient responsible charge related to this service. The patient expressed understanding and agreed to proceed.  Pt location: Home Physician Location: office Name of referring provider:  Caroline More, DO I connected with Veronica Shaw at patients initiation/request on 07/10/2019 at  9:45 AM EST by video enabled telemedicine application and verified that I am speaking with the correct person using two identifiers. Pt MRN:  [id number] Pt DOB:  May 22, 1951 Video Participants:  Veronica Shaw;  ***   History of Present Illness: *** Patient seen today in follow-up for Parkinson's disease.  She is on carbidopa/levodopa 25/100, 1 tablet 3 times per day.  Pt denies falls.  Pt denies lightheadedness, near syncope.  No hallucinations.  After neurocognitive testing that demonstrated anxiety and depression, she was willing to do counseling with the Parkinson's disease Education officer, museum.  However, when she was contacted, she declined counseling at the time.  Medical records are reviewed since our last visit.  She has been seeing rheumatology, who referred her to orthopedics for an EMG of her upper extremities.  This demonstrated bilateral carpal tunnel syndrome.   Current Outpatient Medications on File Prior to Visit  Medication Sig Dispense Refill  . albuterol (PROVENTIL HFA;VENTOLIN HFA) 108 (90 Base) MCG/ACT inhaler Inhale 2 puffs into the lungs every 4 (four) hours as needed for wheezing or shortness of breath. 18 g 3  . Biotin 5000 MCG CAPS Take  1 tablet by mouth daily.    . Calcium Carb-Cholecalciferol 600-800 MG-UNIT TABS Take 1 tablet by mouth daily. 90 tablet 3  . carbidopa-levodopa (SINEMET IR) 25-100 MG tablet TAKE 1 TABLET BY MOUTH THREE TIMES DAILY 270 tablet 0  . famotidine (PEPCID) 20 MG tablet TAKE 1 TABLET TWICE DAILY 180 tablet 2  . fluticasone (FLONASE SENSIMIST) 27.5 MCG/SPRAY nasal spray Place 2 sprays into the nose daily. 10 g 1  . fluticasone (FLONASE) 50 MCG/ACT nasal spray Place 2 sprays into both nostrils daily. 16 g 6  . furosemide (LASIX) 40 MG tablet TAKE 1/2 TABLET EVERY DAY AS NEEDED FOR FLUID/SWELLING 30 tablet 3  . gabapentin (NEURONTIN) 100 MG capsule Take 300mg  (3 pills) at night before bed and 100mg  (1 pill) in the morning with breakfast. (Patient taking differently: as needed. Take 300mg  (3 pills) at night before bed and 100mg  (1 pill) in the morning with breakfast.) 120 capsule 1  . levalbuterol (XOPENEX) 0.63 MG/3ML nebulizer solution 1 vial every 4-6 hours as needed for wheezing and shortness of breath. 180 mL 2  . losartan (COZAAR) 25 MG tablet TAKE 1 TABLET EVERY DAY 90 tablet 3  . meclizine (ANTIVERT) 12.5 MG tablet Take 0.5 tablets (6.25 mg total) by mouth 3 (three) times daily as needed for dizziness. 30 tablet 0  . metFORMIN (GLUCOPHAGE) 500 MG tablet Take 1,000 mg by mouth 2 (two) times daily with a meal.    . methocarbamol (ROBAXIN) 750 MG tablet Take 750 mg by mouth as needed.     . metoprolol succinate (TOPROL-XL) 50 MG 24 hr tablet TAKE 1 TABLET TWICE DAILY 180 tablet  3  . mirtazapine (REMERON) 15 MG tablet Take 1 tablet (15 mg total) by mouth at bedtime. (Patient taking differently: Take 15 mg by mouth as needed. ) 90 tablet 1  . mometasone-formoterol (DULERA) 200-5 MCG/ACT AERO Inhale 2 puffs into the lungs 2 (two) times daily. 3 Inhaler 3  . potassium chloride (K-DUR,KLOR-CON) 10 MEQ tablet Take 1 tablet (10 mEq total) by mouth as needed (take with lasix as needed for swelling). 30 tablet 2   . pravastatin (PRAVACHOL) 20 MG tablet Take 1 tablet (20 mg total) by mouth at bedtime. 90 tablet 0  . spironolactone (ALDACTONE) 25 MG tablet TAKE 1/2 TABLET EVERY DAY 45 tablet 3  . Turmeric 500 MG TABS Take 1 tablet by mouth daily.     No current facility-administered medications on file prior to visit.      Observations/Objective:   There were no vitals filed for this visit. GEN:  The patient appears stated age and is in NAD.  Neurological examination:  Orientation: The patient is alert and oriented x3. Cranial nerves: There is good facial symmetry. There is ***facial hypomimia.  The speech is fluent and clear. Soft palate rises symmetrically and there is no tongue deviation. Hearing is intact to conversational tone. Motor: Strength is at least antigravity x 4.   Shoulder shrug is equal and symmetric.  There is no pronator drift.  Movement examination: Tone: unable Abnormal movements: *** Coordination:  There is *** decremation with RAM's, *** Gait and Station: The patient has *** difficulty arising out of a deep-seated chair without the use of the hands. The patient's stride length is ***.      Assessment and Plan:   1.  Parkinson's disease  -On carbidopa/levodopa 25/100, 1 tablet 3 times per day 2.  Memory change  -Had neurocognitive testing in July, 2020 with Dr. Cecelia Byars.  This was only done through telemedicine and not in person visit.  The diagnosis was moderate level of anxiety and depression.  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   Unfortunately, counseling has been declined.  Follow Up Instructions:    -I discussed the assessment and treatment plan with the patient. The patient was provided an opportunity to ask questions and all were answered. The patient agreed with the plan and demonstrated an understanding of the instructions.   The patient was advised to call back or seek an in-person evaluation if the symptoms worsen or if the condition fails to improve as anticipated.    Total Time spent in visit with the patient was:  ***, of which more than 50% of the time was spent in counseling and/or coordinating care on ***.   Pt understands and agrees with the plan of care outlined.     Alonza Bogus, DO

## 2019-07-09 NOTE — Progress Notes (Signed)
EPIC Encounter for ICM Monitoring  Patient Name: SUHANA CRETE is a 68 y.o. 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  OptiVolThoracic impedancenormal.  Prescribed:Furosemide40 mgTake 0.5 tablets (20 mg total) by mouthevery other dayas needed for fluid (For swelling).   Labs: 03/05/2019 Creatinine 1.31, BUN 24, Potassium 4.9, Sodium 144, GFR 41-48 01/30/2019 Creatinine1.44, BUN22, Potassium4.5, Sodium139, [id number] A complete set of results can be found in Results Review.  Recommendations: No changes and encouraged to call if experiencing any fluid symptoms.  Follow-up plan: ICM clinic phone appointment on 08/12/2019.   91 day device clinic remote transmission 08/29/2019.    Copy of ICM check sent to Dr. Lovena Le.    3 month ICM trend: 07/08/2019    1 Year ICM trend:       [name], RN 07/09/2019 1:56 PM

## 2019-07-10 ENCOUNTER — Telehealth: Payer: Medicare Other | Admitting: Neurology

## 2019-07-10 ENCOUNTER — Telehealth: Payer: Self-pay | Admitting: Pharmacist

## 2019-07-10 NOTE — Patient Outreach (Signed)
[place] [facility]) Care Management  07/10/2019  LYRICK VALIS [birth year] [id number]   Insurance: [employer]   Patient was called for 2021 medication assistance evaluation. HIPAA identifiers were obtained.  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  THN helped patient obtain Dulera and Proventil HFA from [facility] Patient Assistance Program at no cost in 2020.  Patient in interested in seeking assistance for 2021.  Medications were reviewed: Medications Reviewed Today    Reviewed by Elayne Guerin, [facility] (Pharmacist) on 07/10/19 at [place] List Status: <None>  Medication Order Taking? Sig Documenting Provider Last Dose Status Informant  albuterol (PROVENTIL HFA;VENTOLIN HFA) 108 (90 Base) MCG/ACT inhaler [id number] Yes Inhale 2 puffs into the lungs every 4 (four) hours as needed for wheezing or shortness of breath. Rogue Bussing, MD Taking Active Self  Biotin 5000 MCG CAPS [id number] Yes Take 1 tablet by mouth daily. [provider] Taking Active   Calcium Carb-Cholecalciferol 600-800 MG-UNIT TABS [id number] Yes Take 1 tablet by mouth daily. Rogue Bussing, MD Taking Active Self           Med Note Allene Dillon Aug 15, 2018  9:38 AM)    carbidopa-levodopa (SINEMET IR) 25-100 MG tablet [id number] Yes TAKE 1 TABLET BY MOUTH THREE TIMES DAILY Tat, Eustace Quail, DO Taking Active   famotidine (PEPCID) 20 MG tablet [id number] Yes TAKE 1 TABLET TWICE DAILY Caroline More, DO Taking Active   fluticasone (FLONASE SENSIMIST) 27.5 MCG/SPRAY nasal spray [id number] Yes Place 2 sprays into the nose daily. Lurline Del, DO Taking Active   furosemide (LASIX) 40 MG tablet [id number] Yes TAKE 1/2 TABLET EVERY DAY AS NEEDED FOR FLUID/SWELLING  Caroline More, DO Taking Active   gabapentin (NEURONTIN) 100 MG capsule [id number] Yes Take 300mg  (3 pills) at night before bed and 100mg  (1 pill) in the morning with breakfast.  Patient taking differently: as needed. Take 300mg  (3 pills) at night before bed and 100mg  (1 pill) in the morning with breakfast.   Benay Pike, MD Taking Active   levalbuterol Penne Lash) 0.63 MG/3ML nebulizer solution [id number] Yes 1 vial every 4-6 hours as needed for wheezing and shortness of breath. Magdalen Spatz, NP Taking Active   losartan (COZAAR) 25 MG tablet [id number] Yes TAKE 1 TABLET EVERY Lacie Scotts, MD Taking Active   meclizine (ANTIVERT) 12.5 MG tablet [id number] Yes Take 0.5 tablets (6.25 mg total) by mouth 3 (three) times daily as needed for dizziness. Guadalupe Dawn, MD Taking Active   metFORMIN (GLUCOPHAGE) 500 MG tablet [id number] Yes Take 1,000 mg by mouth 2 (two) times daily with a meal. [provider] Taking Active   methocarbamol (ROBAXIN) 750 MG tablet [id number] No Take 750 mg by mouth as needed.  [provider] Unknown Active   metoprolol succinate (TOPROL-XL) 50 MG 24 hr tablet [id number] Yes TAKE 1 TABLET TWICE DAILY [name], MD Taking Active   mirtazapine (REMERON) 15 MG tablet [id number] Yes Take 1 tablet (15 mg total) by mouth at bedtime.  Patient taking differently: Take 15 mg by mouth as needed.    Ludwig Clarks, DO Taking Active   mometasone-formoterol [facility]) 200-5 MCG/ACT AERO [id number] Yes Inhale 2 puffs into the lungs 2 (two) times daily. Lauraine Rinne, NP Taking Active   potassium  chloride (K-DUR,KLOR-CON) 10 MEQ tablet [id number] Yes Take 1 tablet (10 mEq total) by mouth as needed (take with lasix as needed for swelling). Caroline More, DO Taking Active   pravastatin (PRAVACHOL) 20 MG tablet [id number] Yes Take 1 tablet (20 mg total) by mouth at bedtime. Caroline More, DO Taking Active   spironolactone (ALDACTONE) 25 MG tablet [id number] Yes TAKE  1/2 TABLET EVERY Lacie Scotts, MD Taking Active   Turmeric 500 MG TABS [id number] Yes Take 1 tablet by mouth daily. [provider] Taking Active          Medication Findings:  K- 4.9 HgA1c-6.6%  Plan: I will route patient assistance letter to [place] technician who will coordinate patient assistance program application process for medications listed above.  [facility] pharmacy technician will assist with obtaining all required documents from both patient and provider(s) and submit application(s) once completed.    Follow up in 6-8 weeks.  Elayne Guerin, PharmD, [place] Clinical Pharmacist [phone]

## 2019-07-11 ENCOUNTER — Other Ambulatory Visit: Payer: Self-pay | Admitting: Pharmacy Technician

## 2019-07-11 NOTE — Patient Outreach (Signed)
[place] [facility]) Care Management  07/11/2019  Veronica Shaw 1951/05/24 [id number]                                        Medication Assistance Referral  Referral From: [place]  Medication/Company: Ruthe Mannan and Proventil / [employer] Patient application portion:  Education officer, museum portion: [employer] Mailed to Wyn Quaker, NP Provider address/fax verified via: Call to office  This is for 2021 patient assistance   Follow up:  Will follow up with patient in 20-30 business days to confirm application(s) have been received.  Simran Mannis P. Raijon Lindfors, [place] Management [phone]

## 2019-07-30 ENCOUNTER — Other Ambulatory Visit: Payer: Self-pay | Admitting: Pharmacy Technician

## 2019-07-30 NOTE — Patient Outreach (Signed)
[facility]) Care Management  07/30/2019  Veronica Shaw Aug 10, 1950 [id number]   Received both patient and provider portion(s) of patient assistance application(s) for Duelra and Proventil. Mailed completed application and required documents into [employer].  Will follow up with company(ies) in 10-14 business days to check status of application(s).  Quinci Gavidia P. Devorah Givhan, [place] Management [phone]

## 2019-08-12 ENCOUNTER — Ambulatory Visit (INDEPENDENT_AMBULATORY_CARE_PROVIDER_SITE_OTHER): Payer: Medicare Other

## 2019-08-12 DIAGNOSIS — I5032 Chronic diastolic (congestive) heart failure: Secondary | ICD-10-CM

## 2019-08-12 DIAGNOSIS — Z95810 Presence of automatic (implantable) cardiac defibrillator: Secondary | ICD-10-CM | POA: Diagnosis not present

## 2019-08-14 NOTE — Progress Notes (Addendum)
EPIC Encounter for ICM Monitoring  Patient Name: Veronica Shaw is a 69 y.o. female Date: 08/14/2019 Primary Care Physican: Caroline More, DO Primary Cardiologist:Cooper/Weaver PA Electrophysiologist: Lovena Le 08/14/2019 Weight: 140lbs  Spoke with patient and heart failure questions reviewed. She takes PRN Furosemide about every day.   OptiVolThoracic impedancenormal.  Prescribed:Furosemide40 mgTake 0.5 tablets (20 mg total) by mouthevery other dayas needed for fluid (For swelling).   Labs: 03/05/2019 Creatinine 1.31, BUN 24, Potassium 4.9, Sodium 144, GFR 41-48 01/30/2019 Creatinine1.44, BUN22, Potassium4.5, Sodium139, [id number] A complete set of results can be found in Results Review.  Recommendations:No changes and encouraged to call if experiencing any fluid symptoms.  Follow-up plan: ICM clinic phone appointment on2/15/2021. 91 day device clinic remote transmission 08/29/2019.   Copy of ICM check sent to Dr.Taylor.  3 month ICM trend: 08/13/2019    1 Year ICM trend:       [name], RN 08/14/2019 12:21 PM

## 2019-08-16 ENCOUNTER — Other Ambulatory Visit: Payer: Self-pay | Admitting: Pharmacy Technician

## 2019-08-16 ENCOUNTER — Telehealth: Payer: Self-pay | Admitting: Rheumatology

## 2019-08-16 NOTE — Patient Outreach (Signed)
[facility]) Care Management  08/16/2019  Veronica Shaw 08-05-1950 [id number]  Care coordination call placed to [employer] in regards to patient's application for [facility] and Proventil.   Spoke to [facility] who informed they have not received the application that was mailed to them on 07/30/2019. Marva informed they are running behind on process applications due to delay within the [employer]PS as well as the holidays.  Will follow up with [employer] in 15-20 business days to inquire about status of application.  Sheritha Louis P. Brody Bonneau, [place] Management [phone]

## 2019-08-16 NOTE — Telephone Encounter (Signed)
Called patient to reschedule her 08/23/19 appointment.  While on the phone, patient stated she had Euflexxa injections in her bilateral knees in November and is now in more pain than before having the injections.  Patient declined virtual and in-person appointment.

## 2019-08-19 ENCOUNTER — Ambulatory Visit: Payer: Self-pay | Admitting: Pharmacist

## 2019-08-19 NOTE — Telephone Encounter (Signed)
Attempted to contact the patient and left message for patient to call the office.  

## 2019-08-20 ENCOUNTER — Other Ambulatory Visit: Payer: Self-pay | Admitting: *Deleted

## 2019-08-20 MED ORDER — METFORMIN HCL 500 MG PO TABS: 1000.0000 mg | ORAL_TABLET | Freq: Two times a day (BID) | ORAL | 1 refills | Status: DC

## 2019-08-23 ENCOUNTER — Telehealth: Payer: Self-pay

## 2019-08-23 ENCOUNTER — Ambulatory Visit: Payer: Medicare Other | Admitting: Rheumatology

## 2019-08-23 NOTE — Telephone Encounter (Signed)
Patient calls nurse line wanting to speak with PCP about Covid Vaccine. Patient is scheduled to get this next week, however has some questions regarding side effects and past reactions she has had in the past to the small pox vaccine. I attempted to reassure her, however she would prefer to hear from PCP. Please give her a call when possible.

## 2019-08-23 NOTE — Telephone Encounter (Signed)
Patient had question regarding coronavirus vaccine.  States that she has had allergic reactions to medication such as penicillin and other vaccines in the past.  She is scheduled to have the coronavirus vaccine this week and was asking if she would get an allergic reaction to this.  I explained to her that we will not be able to determine whether or not she will have an allergic reaction but she needs to understand the risks of possible anaphylaxis with getting the coronavirus vaccine.  Patient has had allergic reactions in the past, reports possible anaphylaxis.  I explained to her that if she has had anaphylaxis to other vaccines there is a risk of getting anaphylaxis to the coronavirus vaccine.  I explained to her that we will be unable to determine whether or not she will definitely have anaphylaxis.  I explained to her that she needs to understand that if she gets a coronavirus vaccine this is a risk.  I explained to her to let those administering vaccine know that she has had anaphylaxis in the past other vaccinations and that she will need significant monitoring afterwards if she chooses to go through with the vaccine.  I also gave her online references such as the CDC to read more about the vaccine and possible complications.  Patient felt reassured and was appreciative of call.  She was able to verbalize the risks of possible anaphylaxis if she gets the coronavirus vaccine and that it will be her decision if she decides to take the vaccination.  Dalphine Handing, PGY-3 [facility] 08/23/2019 7:59 PM

## 2019-08-27 ENCOUNTER — Ambulatory Visit: Payer: Self-pay

## 2019-08-29 ENCOUNTER — Ambulatory Visit (INDEPENDENT_AMBULATORY_CARE_PROVIDER_SITE_OTHER): Payer: Medicare Other | Admitting: *Deleted

## 2019-08-29 ENCOUNTER — Telehealth: Payer: Self-pay | Admitting: Pulmonary Disease

## 2019-08-29 ENCOUNTER — Ambulatory Visit: Payer: Self-pay | Admitting: Pharmacist

## 2019-08-29 ENCOUNTER — Other Ambulatory Visit: Payer: Self-pay | Admitting: Family Medicine

## 2019-08-29 DIAGNOSIS — I5022 Chronic systolic (congestive) heart failure: Secondary | ICD-10-CM | POA: Diagnosis not present

## 2019-08-29 DIAGNOSIS — Z1231 Encounter for screening mammogram for malignant neoplasm of breast: Secondary | ICD-10-CM

## 2019-08-29 LAB — CUP PACEART REMOTE DEVICE CHECK
Battery Remaining Longevity: 38 mo
Battery Voltage: 2.97 V
Brady Statistic AP VP Percent: 0 %
Brady Statistic AP VS Percent: 0 %
Brady Statistic AS VP Percent: 0.03 %
Brady Statistic AS VS Percent: 99.96 %
Brady Statistic RA Percent Paced: 0.01 %
Brady Statistic RV Percent Paced: 0.04 %
Date Time Interrogation Session: 20210128033423
HighPow Impedance: 75 Ohm
Implantable Lead Implant Date: 20150427
Implantable Lead Implant Date: 20150427
Implantable Lead Location: 753859
Implantable Lead Location: 753860
Implantable Lead Model: 5076
Implantable Lead Model: 6935
Implantable Pulse Generator Implant Date: 20150427
Lead Channel Impedance Value: 399 Ohm
Lead Channel Impedance Value: 4047 Ohm
Lead Channel Impedance Value: 4047 Ohm
Lead Channel Impedance Value: 4047 Ohm
Lead Channel Impedance Value: 551 Ohm
Lead Channel Impedance Value: 608 Ohm
Lead Channel Pacing Threshold Amplitude: 0.5 V
Lead Channel Pacing Threshold Amplitude: 0.5 V
Lead Channel Pacing Threshold Pulse Width: 0.4 ms
Lead Channel Pacing Threshold Pulse Width: 0.4 ms
Lead Channel Sensing Intrinsic Amplitude: 13.125 mV
Lead Channel Sensing Intrinsic Amplitude: 13.125 mV
Lead Channel Sensing Intrinsic Amplitude: 2 mV
Lead Channel Sensing Intrinsic Amplitude: 2 mV
Lead Channel Setting Pacing Amplitude: 2 V
Lead Channel Setting Pacing Amplitude: 2.5 V
Lead Channel Setting Pacing Pulse Width: 0.4 ms
Lead Channel Setting Sensing Sensitivity: 0.3 mV

## 2019-08-29 NOTE — Progress Notes (Signed)
ICD Remote  

## 2019-08-29 NOTE — Telephone Encounter (Signed)
Called and spoke with Patient.  Patient is concerned about if she needs covid vaccine.  Advised patient that CDC and [facility] providers are recommending vaccine.  Advised patient that older people and people with underlying health issues, like pulmonary are volnerable. Patient stated she has allergies and was advised to ask her pulmonary provider before scheduling, and receiving covid vaccine.  Message routed to Aaron Edelman, NP to advise

## 2019-08-29 NOTE — Telephone Encounter (Signed)
The patient brings up a good question.    Ultimately the concerns regarding a Covid vaccine as well as a history of anaphylaxis is directly related to anaphylaxis to polyethylene glycol, other injectable medications, or other vaccines.    Has the patient had any severe allergic reactions or anaphylaxis to polyethylene glycol?  Injectable medications?  Or other vaccines?  If the answer to that is no that I believe she would be a candidate for the Covid vaccine.  I would recommend that if the patient decides to get the Covid vaccine that she is monitored for 30 minutes instead of the standard 15 to ensure no other reactions.     Based off the national data from [facility] as well as ACIP patient would still be a candidate for the Covid vaccine.  Please route to patient's established pulmonary provider as FYI after contacting the patient.  Wyn Quaker, FNP

## 2019-08-29 NOTE — Telephone Encounter (Signed)
Called and spoke with Patient.  Brian,NP's recommendations given.  Patient denies any problem with flu or pneumonia vaccines in the past.  Patient stated she did have a few shingles after her shingles vaccine. Patient denies any swelling, closing of throat, inability to breathe with previous vaccines.  Patient stated she would also speak with her PCP about the  covid vaccine and if she may need a epipen. Nothing further at this time.

## 2019-08-30 ENCOUNTER — Other Ambulatory Visit: Payer: Self-pay | Admitting: Pharmacy Technician

## 2019-08-30 NOTE — Patient Outreach (Signed)
[place] [facility]) Care Management  08/30/2019  Veronica Shaw [birth year] [id number]   Incoming call received from patient in regards to a status update on [employer] application for [employer] and Proventil.  Spoke to patient, HIPAA identifiers verified.  Informed patient that [employer] was having significant processing delays and just this week they were only processing the 1st week of December mailed applications.Informed her they were running about 30 days behind in processing. Informed patient that [employer] would mail her the attestation form to complete. Informed patient to make sure she checks mail regularly. Patient verbalized understanding.  Will follow up with [employer] as previously scheduled.  Veronica Shaw P. Jossette Zirbel, [place] Management [phone]

## 2019-09-04 ENCOUNTER — Other Ambulatory Visit: Payer: Self-pay | Admitting: Pharmacy Technician

## 2019-09-04 NOTE — Patient Outreach (Signed)
[place] [facility]) Care Management  09/04/2019  DORE DIEKMAN 1951-01-06 [id number]   Care coordination call placed to [employer] in regards to patient's application for [facility] and Proventil.  Spoke to [place] who informed they have not received the application that was mailed to them on 07/30/2019.   She suggested to either have patient reapply or if a copy of the application was made, then that could be mailed to [employer] patient assistance program at [employer], [place], [place] [phone] or it could be sent over night mail to the physical address which is [address] [place], [place] [address].  Will follow up with [employer] in 7-10 business days to inquire about status of application.  Clair Bardwell P. Terrika Zuver, [place] Management [phone]

## 2019-09-05 ENCOUNTER — Ambulatory Visit: Payer: Medicare Other | Attending: Internal Medicine

## 2019-09-05 DIAGNOSIS — Z23 Encounter for immunization: Secondary | ICD-10-CM

## 2019-09-05 NOTE — Progress Notes (Signed)
   Covid-19 Vaccination Clinic  Name:  Veronica Shaw    MRN: [id number] DOB: 03/10/1951  09/05/2019  [name] was observed post Covid-19 immunization for 30 minutes based on pre-vaccination screening without incidence. She was provided with Vaccine Information Sheet and instruction to access the V-Safe system.   [name] was instructed to call 911 with any severe reactions post vaccine: [name] Kitchen Difficulty breathing  . Swelling of your face and throat  . A fast heartbeat  . A bad rash all over your body  . Dizziness and weakness    Immunizations Administered    Name Date Dose VIS Date Route   Pfizer COVID-19 Vaccine 09/05/2019  2:26 PM 0.3 mL 07/12/2019 Intramuscular   Manufacturer: [place]   Lot: [id number]   [place]: [id number]

## 2019-09-07 ENCOUNTER — Ambulatory Visit: Payer: Self-pay

## 2019-09-10 ENCOUNTER — Other Ambulatory Visit: Payer: Self-pay | Admitting: *Deleted

## 2019-09-10 NOTE — Patient Outreach (Signed)
[place] [facility]) Care Management  09/10/2019  IBET WAAG 04/22/1951 [id number]  RN Health Coach telephone call to patient.  [employer] compliance verified. Patient is waiting for a call from the Dr on her husband. RN will reschedule to call patient back next week.  Plan: Call patient back within the month of February  Lennie Dunnigan [place] Care Management [phone]

## 2019-09-13 ENCOUNTER — Other Ambulatory Visit: Payer: Self-pay | Admitting: Pharmacy Technician

## 2019-09-13 NOTE — Patient Outreach (Signed)
[facility]) Care Management  09/13/2019  Veronica Shaw 1951/07/19 [id number]  Care coordination call placed to [employer] in regards to patient's application for [facility] and Proventil.  Spoke to [place] who informed they have not received the application that was mailed to [employer] on 07/30/2019.  Will follow up with [employer] in 5-10 business days. Of note, application was mailed to [employer] again the 1st week of February.  Sharni Negron P. Jontay Maston, [place] Management [phone]

## 2019-09-16 ENCOUNTER — Ambulatory Visit (INDEPENDENT_AMBULATORY_CARE_PROVIDER_SITE_OTHER): Payer: Medicare Other

## 2019-09-16 ENCOUNTER — Other Ambulatory Visit: Payer: Self-pay | Admitting: Family Medicine

## 2019-09-16 DIAGNOSIS — Z95810 Presence of automatic (implantable) cardiac defibrillator: Secondary | ICD-10-CM | POA: Diagnosis not present

## 2019-09-16 DIAGNOSIS — I5022 Chronic systolic (congestive) heart failure: Secondary | ICD-10-CM | POA: Diagnosis not present

## 2019-09-17 ENCOUNTER — Other Ambulatory Visit: Payer: Self-pay | Admitting: *Deleted

## 2019-09-17 NOTE — Patient Outreach (Signed)
[place] [facility]) Care Management  [place]  09/17/2019   JOSSELINE PARKHILL 08-Nov-1950 [id number]  RN Health Coach telephone call to patient.  [place] compliance verified. Per patient she has been weighing and taking medications as prescribed. Patient stated her weight is only fluctuating 1 pound. Per patient she feels tired . Patient stated she falls asleep during the day. Patient stated she use to be on a CPAP machine and have been off it  For years since she had lost weight. RN discussed with patient about making physician aware and that she may need to do another sleep study. Patient has an appointment tomorrow with pulmonologist and will discuss this. Patient has agreed to follow up outreach calls.    Encounter Medications:  Outpatient Encounter Medications as of 09/17/2019  Medication Sig  . albuterol (PROVENTIL HFA;VENTOLIN HFA) 108 (90 Base) MCG/ACT inhaler Inhale 2 puffs into the lungs every 4 (four) hours as needed for wheezing or shortness of breath.  . Biotin 5000 MCG CAPS Take 1 tablet by mouth daily.  . Calcium Carb-Cholecalciferol 600-800 MG-UNIT TABS Take 1 tablet by mouth daily.  . carbidopa-levodopa (SINEMET IR) 25-100 MG tablet TAKE 1 TABLET BY MOUTH THREE TIMES DAILY  . famotidine (PEPCID) 20 MG tablet TAKE 1 TABLET TWICE DAILY  . fluticasone (FLONASE SENSIMIST) 27.5 MCG/SPRAY nasal spray Place 2 sprays into the nose daily.  . furosemide (LASIX) 40 MG tablet TAKE 1/2 TABLET EVERY DAY AS NEEDED FOR  FLUID/SWELLING  . gabapentin (NEURONTIN) 100 MG capsule Take 300mg  (3 pills) at night before bed and 100mg  (1 pill) in the morning with breakfast. (Patient taking differently: as needed. Take 300mg  (3 pills) at night before bed and 100mg  (1 pill) in the morning with breakfast.)  . levalbuterol (XOPENEX) 0.63 MG/3ML nebulizer solution 1 vial every 4-6 hours as needed for wheezing and shortness of breath.  . losartan (COZAAR) 25 MG tablet TAKE 1 TABLET EVERY DAY  .  meclizine (ANTIVERT) 12.5 MG tablet Take 0.5 tablets (6.25 mg total) by mouth 3 (three) times daily as needed for dizziness.  . metFORMIN (GLUCOPHAGE) 500 MG tablet Take 2 tablets (1,000 mg total) by mouth 2 (two) times daily with a meal.  . methocarbamol (ROBAXIN) 750 MG tablet Take 750 mg by mouth as needed.   . metoprolol succinate (TOPROL-XL) 50 MG 24 hr tablet TAKE 1 TABLET TWICE DAILY  . mirtazapine (REMERON) 15 MG tablet Take 1 tablet (15 mg total) by mouth at bedtime. (Patient taking differently: Take 15 mg by mouth as needed. )  . mometasone-formoterol (DULERA) 200-5 MCG/ACT AERO Inhale 2 puffs into the lungs 2 (two) times daily.  . potassium chloride (K-DUR,KLOR-CON) 10 MEQ tablet Take 1 tablet (10 mEq total) by mouth as needed (take with lasix as needed for swelling).  . pravastatin (PRAVACHOL) 20 MG tablet Take 1 tablet (20 mg total) by mouth at bedtime.  [name] Kitchen spironolactone (ALDACTONE) 25 MG tablet TAKE 1/2 TABLET EVERY DAY  . Turmeric 500 MG TABS Take 1 tablet by mouth daily.   No facility-administered encounter medications on file as of 09/17/2019.    Functional Status:  In your present state of health, do you have any difficulty performing the following activities: 07/09/2019 04/02/2019  Hearing? N N  Vision? Y Y  Comment - Patient stated he eyelids are droopy and eyes always puffy  Difficulty concentrating or making decisions? Y (No Data)  Comment - per patient she is forgetful  Walking or climbing stairs? [name]  Comment - pt has shortness of breath of on exertion  Dressing or bathing? N N  Doing errands, shopping? Y Y  Comment - family does most of the shopping. Patient is doing a little driving now  Conservation officer, nature and eating ? N N  Using the Toilet? N N  In the past six months, have you accidently leaked urine? Y Y  Comment - -  Do you have problems with loss of bowel control? N N  Managing your Medications? N N  Managing your Finances? N N  Housekeeping or managing your  Housekeeping? N N  Some recent data might be hidden    Fall/Depression Screening: Fall Risk  09/17/2019 07/09/2019 04/02/2019  Falls in the past year? 1 1 1   Comment - - -  Number falls in past yr: 1 1 1   Injury with Fall? 0 0 0  Comment - - -  Risk Factor Category  Moderate Risk (1 Point) Moderate Risk (1 Point) Low Risk (0 Points)  Risk for fall due to : History of fall(s);Impaired balance/gait;Impaired mobility History of fall(s);Impaired balance/gait;Impaired mobility Impaired balance/gait;Impaired mobility;Impaired vision;History of fall(s)  Follow up - Falls evaluation completed;Education provided;Falls prevention discussed Falls evaluation completed;Education provided;Falls prevention discussed  Comment - - -   PHQ 2/9 Scores 09/17/2019 07/09/2019 04/02/2019 03/05/2019 12/26/2018 11/19/2018 09/27/2018  PHQ - 2 Score 0 0 0 0 0 0 0  PHQ- 9 Score - - - - - - -  Exception Documentation - - - - - - -   THN CM Care Plan Problem One     Most Recent Value  Care Plan Problem One  Knowledge Deficit in self management of chf  Role Documenting the Problem One  [place] for Problem One  Active  THN Long Term Goal   Patient will follow up on health maintenance within the next 90 days  Interventions for Problem One Long Term Goal  RN discussed health maintenance and COVID vaccinations. Patient has received 1st vaccine and will follow up for her2nd vaccine. RN discussed pandemic precautions the 3 W's. RN will follow up for further discussion  THN CM Short Term Goal #1   Patient will not have a CHF exacerbation within the next 30 days  THN CM Short Term Goal #1 Start Date  09/17/19  Interventions for Short Term Goal #1  RN will continue to reiterate the CHF zones, action planand theimportanceofweighing to help monitorincreasefluid. RN will follow up each outreach with reiterating       Assessment:  Patient has received 1st COVID vaccine Patient is weighing and taking medications as  prescribed Patient is using pandemic precautions  Plan:  RN reiterated zone and action plan for CHF exacerbation Patient has scheduled 2nd Vaccine Patient will follow up wit Pulmonologist regarding sleep apnea RN will follow up outreach within the month of May   Chasta Deshpande [place] [facility] [phone]

## 2019-09-18 ENCOUNTER — Ambulatory Visit (INDEPENDENT_AMBULATORY_CARE_PROVIDER_SITE_OTHER): Payer: Medicare Other | Admitting: Family Medicine

## 2019-09-18 ENCOUNTER — Ambulatory Visit (INDEPENDENT_AMBULATORY_CARE_PROVIDER_SITE_OTHER): Payer: Medicare Other | Admitting: Adult Health

## 2019-09-18 ENCOUNTER — Ambulatory Visit
Admission: RE | Admit: 2019-09-18 | Discharge: 2019-09-18 | Disposition: A | Discharge status: Home / Self Care | Payer: Medicare Other | Source: Ambulatory Visit | Attending: Family Medicine | Admitting: Family Medicine

## 2019-09-18 ENCOUNTER — Other Ambulatory Visit: Payer: Self-pay

## 2019-09-18 ENCOUNTER — Encounter: Payer: Self-pay | Admitting: Adult Health

## 2019-09-18 VITALS — BP 112/68 | HR 83 | Temp 96.5°F | Wt 140.8 lb

## 2019-09-18 DIAGNOSIS — I5022 Chronic systolic (congestive) heart failure: Secondary | ICD-10-CM | POA: Diagnosis not present

## 2019-09-18 DIAGNOSIS — M549 Dorsalgia, unspecified: Secondary | ICD-10-CM | POA: Insufficient documentation

## 2019-09-18 DIAGNOSIS — E119 Type 2 diabetes mellitus without complications: Secondary | ICD-10-CM | POA: Diagnosis not present

## 2019-09-18 DIAGNOSIS — M545 Low back pain, unspecified: Secondary | ICD-10-CM

## 2019-09-18 DIAGNOSIS — J4530 Mild persistent asthma, uncomplicated: Secondary | ICD-10-CM

## 2019-09-18 DIAGNOSIS — G4730 Sleep apnea, unspecified: Secondary | ICD-10-CM

## 2019-09-18 DIAGNOSIS — E118 Type 2 diabetes mellitus with unspecified complications: Secondary | ICD-10-CM

## 2019-09-18 HISTORY — DX: Dorsalgia, unspecified: M54.9

## 2019-09-18 LAB — POCT URINALYSIS DIP (MANUAL ENTRY)
Bilirubin, UA: NEGATIVE
Blood, UA: NEGATIVE
Glucose, UA: NEGATIVE mg/dL
Ketones, POC UA: NEGATIVE mg/dL
Nitrite, UA: NEGATIVE
Protein Ur, POC: NEGATIVE mg/dL
Spec Grav, UA: 1.01 (ref 1.010–1.025)
Urobilinogen, UA: 0.2 E.U./dL
pH, UA: 5.5 (ref 5.0–8.0)

## 2019-09-18 LAB — POCT GLYCOSYLATED HEMOGLOBIN (HGB A1C): HbA1c, POC (controlled diabetic range): 7.4 % — AB (ref 0.0–7.0)

## 2019-09-18 MED ORDER — VALACYCLOVIR HCL 1 G PO TABS: 1000.0000 mg | ORAL_TABLET | Freq: Three times a day (TID) | ORAL | 0 refills | Status: DC

## 2019-09-18 MED ORDER — LEVALBUTEROL HCL 0.63 MG/3ML IN NEBU: INHALATION_SOLUTION | RESPIRATORY_TRACT | 5 refills | Status: DC

## 2019-09-18 NOTE — Assessment & Plan Note (Signed)
Controlled on current regimen   Plan  Patient Instructions  Set up split night sleep study .  Continue on Dulera 2 puffs Twice daily  , rinse after use.  Use Xopenex As needed for wheezing and shortness of breath .  Activity as tolerated.  Covid vaccine as planned.  Follow up in 3 -4 months with Dr. Loanne Drilling or Kyrian Stage NP and As needed   Please contact office for sooner follow up if symptoms do not improve or worsen or seek emergency care

## 2019-09-18 NOTE — Progress Notes (Signed)
CHIEF COMPLAINT / HPI:  Back pain Presenting with back pain.  Concerned that this might be a urine infection.  States that the back pain is mainly on her right side and has been happening for 2 weeks.  Did report falling in the bathtub over a month ago.  No pain directly after fall.  Started having radiation to her pelvis 5 days ago.  Denies any dysuria.  Denies any frequency.  Denies any urgency.  Denies any hematuria.  Is having daily bowel movements denies constipation or diarrhea.  Does not notice blood in her stool.  Pain is worse with movement.  Nothing makes it better.  She can even lay in bed and have pain.  Denies any fevers.  Does report a sharp.  Did get her shingles vaccine.  Diabetes Fasting checks: 160-180s, one day had a fasting in 400s but was only once  Post prandial does not check  Compliance: good  Diet: eats lots of rice   Exercise: stationary bike every day Eye exam: "its been a while" Foot exam: UTD A1C: 7.4 Symptoms: no symptoms of hypoglycemia. no symptoms of  polyuria, polydipsia. no numbness in extremities, and no foot ulcers/trauma Meds: metformin 1000mg  bid Pneumonia vaccine: UTD Urine micro albumin:creatine ratio: CKDIII, sees nephrology   Monitoring Labs and Parameters Last A1C:  Lab Results  Component Value Date   HGBA1C 7.4 (A) 09/18/2019   Last Lipid:     Component Value Date/Time   CHOL 182 03/05/2019 0911   HDL 43 03/05/2019 0911   LDLDIRECT 111 (H) 02/10/2015 1145   Last Bmet  Potassium  Date Value Ref Range Status  03/05/2019 4.9 3.5 - 5.2 mmol/L Final   Sodium  Date Value Ref Range Status  03/05/2019 144 134 - 144 mmol/L Final   Creat  Date Value Ref Range Status  01/30/2019 1.44 (H) 0.50 - 0.99 mg/dL Final    Comment:    For patients >55 years of age, the reference limit for Creatinine is approximately 13% higher for people identified as African-American. .    Creatinine, Ser  Date Value Ref Range Status  03/05/2019 1.31  (H) 0.57 - 1.00 mg/dL Final     COVID vaccine Patient received the first dose of her COVID vaccine without complication. Has her second dose scheduled for 3/2.   PERTINENT  PMH / PSH: CHF, HTN, NICM, GERD, HLD, T2DM, Parkinsons disease, spondyloarthropathy, ICD   OBJECTIVE: BP 112/68   Pulse 83   Temp (!) 96.5 F (35.8 C) (Axillary)   Wt 140 lb 12.8 oz (63.9 kg)   LMP 08/01/2000 (Approximate)   SpO2 99%   BMI 26.17 kg/m   Gen: awake and alert, NAD Cardio: RRR, ICD in place, no MRG Resp: CTAB, no wheezes, rales or rhonchi GI: soft, non tender, non distended Back: tenderness to palpation of in L2-3 dermatomal pattern on right back and right front. Tenderness to minimal palpation of shirt  Foot exam: No deformities, ulcerations, skin breakdown bilaterally, intact to touch and monofilament testing bilaterally, PT and DP pulses intact  ASSESSMENT / PLAN:  Back pain Patient with significant back pain even to minimal palpation of short.  Differentials include possible compression fracture given age.  She also with remote trauma history but no pain directly after fall so less likely related to this fall.  Can also consider shingles given that patient has dermatomal pattern pain that goes from her back to her foot and has pain with minimal palpation.  Will obtain  lumbar spine x-ray films to rule out fracture.  We will plan to treat empirically with valacyclovir for possible shingles.  Patient did have shingles vaccine but states that she even got a shingles rash after the vaccine.  Strict return precautions given.  Follow-up in 2 weeks.  Sooner if worsening.  Type II diabetes mellitus with complication 123456 elevated 7.4 but still within goal of less than 8.  Advised healthy diet, limiting rice as well as sweets.  Patient is exercising daily which I encouraged.  Advised to have eye exam, patient will call her eye doctor to schedule this.  Continue Metformin 1000 mg twice daily.  Follow-up in 3  months.  Foot exam updated today.     Caroline More, DO  PGY-3 [place]

## 2019-09-18 NOTE — Progress Notes (Signed)
@Patient  ID: Veronica Shaw, female    DOB: 1951-02-21, 69 y.o.   MRN: [id number]  Chief Complaint  Patient presents with  . Follow-up    Asthma     Referring provider: Caroline More, DO  HPI: 69 year old female never smoker followed for chronic asthma and upper airway cough Medical history is significant for Diastolic  congestive heart failure, Osteoarthritis , DDD, ?rheumatoid arthritis, Parkinson's disease, chronic kidney disease stage III, diabetes GERD and depression  TEST/EVENTS :  11/15/2018- CTA-negative for PE, left lower lobe calcified granulomas, no acute pulmonary process  Pulmonaryfunction testing: Spirometry June 2016 ratio 62%, FEV1 0.98 L 46% predicted July 2016 ratio normal, FEV1 1.97 L 93% predicted, 17% change with bronchodilator, FVC is 2.49 L 89% predicted, total lung capacity 4.32 L 93% predicted, DLCO 21.77 107% predicted January 2017 simple spirometry ratio 71%, FEV1 1.32 L 65% predicted  Spirometry January 2017 FEV1 65%, ratio 71, FVC 72% PFT July 2016 FEV1 93%, ratio 79, FVC 89%, significant bronchodilator response, DLCO is 107%. 2D echo March 2019 EF 123456, grade 2 diastolic dysfunction  Exhaled nitric oxide testing: January 2017 79 ppm  09/18/2019 Follow up : Asthma  Patient presents for a follow-up visit she was last seen April 2020.  Patient has underlying chronic asthma.  She is maintained on Dulera twice daily.  She uses Xopenex Nebs As needed  .  Patient says overall breathing is doing is about the same. Does not like the cold weather/air causes more asthma issues. Feels the [facility] really works well. Feels better after using it.  She has rare Xopenex use. Tries to stay active, keeps grandson. Does housework.  Says activity tolerance is low, low stamina.  Gets Dulera and Albuterol thru patient assistance program.   Complains of daytime sleepiness, restless sleep and snoring. Epworth is 10.  Previously diagnosed with OSA 15 years ago but did  not tolerate CPAP.   Has known systolic and diastolic CHF . Last echo showed improved EF at  60%   Had Covid vaccine #1 2 weeks ago.    Allergies  Allergen Reactions  . Aspirin Swelling and Other (See Comments)    Other reaction(s): Other (See Comments) Causes nose bleeds *ONLY THE COATED ASA* Causes nose bleeds *ONLY THE COATED ASA*  . Penicillins Shortness Of Breath and Rash    Shortness of Breath - Throat felt like it was closing.   [name] [Conj Estrog-Medroxyprogest Ace] Shortness Of Breath    Throat swelling Throat swelling  . Ace Inhibitors Cough  . Simvastatin Other (See Comments) and Rash    Muscle aches    Immunization History  Administered Date(s) Administered  . Influenza Split 06/17/2011, 04/18/2012  . Influenza Whole 05/17/2007, 06/14/2010  . Influenza,inj,Quad PF,6+ Mos 05/20/2014, 06/04/2015, 03/22/2016, 05/30/2017, 04/12/2018  . Influenza,inj,Quad PF,6-35 Mos 05/03/2013  . Influenza-Unspecified 04/01/2019  . PFIZER SARS-COV-2 Vaccination 09/05/2019  . Pneumococcal Conjugate-13 05/20/2014  . Pneumococcal Polysaccharide-23 05/17/2007, 06/01/2013, 03/05/2019  . Td 03/01/2006  . Zoster 08/01/2009    Past Medical History:  Diagnosis Date  . Asthma   . Chronic systolic CHF (congestive heart failure) ([place])    a. cMRI 4/15: EF 34% and findings - c/w NICM, normal RV size and function (RVEF 61%), Mild MR // b. Echo 2/15:  EF 30-35%, diff HK, ant-sept AK, Gr 2 DD, mild MR, trivial TR  //  c. Echo 5/17: EF 20-25%, severe diffuse HK, marked systolic dyssynchrony, grade 1 diastolic dysfunction, mild MR  //  d.  RHC 5/17: Fick CO 2.9, RVSP 19, PASP 15, PW mean 2, low filing pressures and preserved CO   . Diabetes mellitus   . Flu 10/17/2017  . Gastritis   . History of echocardiogram    Echo 6/18: EF 30-35, diffuse HK, grade 1 diastolic dysfunction, trivial MR, mild LAE, mild TR, no pericardial effusion  . History of nuclear stress test    Myoview 5/18: EF 49, no  ischemia, inferoseptal defect c/w LBBB artifact (intermediate risk due to EF < 50).  [name] Kitchen HTN (hypertension)   . Hyperlipidemia   . NICM (nonischemic cardiomyopathy) ([place])    a. Nuclear 5/13: Normal stress nuclear study. LV Ejection Fraction: 58%  //  b. LHC 10/14: Minor luminal irregularity in prox LAD, EF 35%   . Parkinson disease ([place])   . Plantar fasciitis   . Sleep apnea    was retested and no longer had it and so d/c CPAP  . Urticaria     Tobacco History: Social History   Tobacco Use  Smoking Status Never Smoker  Smokeless Tobacco Never Used  Tobacco Comment   exposed to smoke during child hood (parents)   Counseling given: Not Answered Comment: exposed to smoke during child hood (parents)   Outpatient Medications Prior to Visit  Medication Sig Dispense Refill  . albuterol (PROVENTIL HFA;VENTOLIN HFA) 108 (90 Base) MCG/ACT inhaler Inhale 2 puffs into the lungs every 4 (four) hours as needed for wheezing or shortness of breath. 18 g 3  . Biotin 5000 MCG CAPS Take 1 tablet by mouth daily.    . Calcium Carb-Cholecalciferol 600-800 MG-UNIT TABS Take 1 tablet by mouth daily. 90 tablet 3  . carbidopa-levodopa (SINEMET IR) 25-100 MG tablet TAKE 1 TABLET BY MOUTH THREE TIMES DAILY 270 tablet 0  . famotidine (PEPCID) 20 MG tablet TAKE 1 TABLET TWICE DAILY 180 tablet 2  . fluticasone (FLONASE SENSIMIST) 27.5 MCG/SPRAY nasal spray Place 2 sprays into the nose daily. 10 g 1  . furosemide (LASIX) 40 MG tablet TAKE 1/2 TABLET EVERY DAY AS NEEDED FOR  FLUID/SWELLING 30 tablet 3  . gabapentin (NEURONTIN) 100 MG capsule Take 300mg  (3 pills) at night before bed and 100mg  (1 pill) in the morning with breakfast. (Patient taking differently: as needed. Take 300mg  (3 pills) at night before bed and 100mg  (1 pill) in the morning with breakfast.) 120 capsule 1  . levalbuterol (XOPENEX) 0.63 MG/3ML nebulizer solution 1 vial every 4-6 hours as needed for wheezing and shortness of breath. 180 mL 2  .  losartan (COZAAR) 25 MG tablet TAKE 1 TABLET EVERY DAY 90 tablet 3  . meclizine (ANTIVERT) 12.5 MG tablet Take 0.5 tablets (6.25 mg total) by mouth 3 (three) times daily as needed for dizziness. 30 tablet 0  . metFORMIN (GLUCOPHAGE) 500 MG tablet Take 2 tablets (1,000 mg total) by mouth 2 (two) times daily with a meal. 180 tablet 1  . methocarbamol (ROBAXIN) 750 MG tablet Take 750 mg by mouth as needed.     . metoprolol succinate (TOPROL-XL) 50 MG 24 hr tablet TAKE 1 TABLET TWICE DAILY 180 tablet 3  . mirtazapine (REMERON) 15 MG tablet Take 1 tablet (15 mg total) by mouth at bedtime. (Patient taking differently: Take 15 mg by mouth as needed. ) 90 tablet 1  . mometasone-formoterol (DULERA) 200-5 MCG/ACT AERO Inhale 2 puffs into the lungs 2 (two) times daily. 3 Inhaler 3  . potassium chloride (K-DUR,KLOR-CON) 10 MEQ tablet Take 1 tablet (10 mEq total) by  mouth as needed (take with lasix as needed for swelling). 30 tablet 2  . pravastatin (PRAVACHOL) 20 MG tablet Take 1 tablet (20 mg total) by mouth at bedtime. 90 tablet 0  . spironolactone (ALDACTONE) 25 MG tablet TAKE 1/2 TABLET EVERY DAY 45 tablet 3  . Turmeric 500 MG TABS Take 1 tablet by mouth daily.     No facility-administered medications prior to visit.     Review of Systems:   Constitutional:   No  weight loss, night sweats,  Fevers, chills, + fatigue, or  lassitude.  HEENT:   No headaches,  Difficulty swallowing,  Tooth/dental problems, or  Sore throat,                No sneezing, itching, ear ache, nasal congestion, post nasal drip,   CV:  No chest pain,  Orthopnea, PND, swelling in lower extremities, anasarca, dizziness, palpitations, syncope.   GI  No heartburn, indigestion, abdominal pain, nausea, vomiting, diarrhea, change in bowel habits, loss of appetite, bloody stools.   Resp:    No excess mucus, no productive cough,  No non-productive cough,  No coughing up of blood.  No change in color of mucus.  No wheezing.  No chest  wall deformity  Skin: no rash or lesions.  GU: no dysuria, change in color of urine, no urgency or frequency.  No flank pain, no hematuria   MS:  No joint pain or swelling.  No decreased range of motion.  No back pain.    Physical Exam  BP 114/64 (BP Location: Left Arm, Cuff Size: Normal)   Pulse 88   Temp (!) 97.1 F (36.2 C) (Temporal)   Ht 5' 1.5" (1.562 m)   Wt 140 lb 12.8 oz (63.9 kg)   LMP 08/01/2000 (Approximate)   SpO2 98% Comment: RA  BMI 26.17 kg/m   GEN: A/Ox3; pleasant , NAD, elderly    HEENT:  [place]/AT,  EACs-clear, TMs-wnl, NOSE-clear, THROAT-clear, no lesions, no postnasal drip or exudate noted.   NECK:  Supple w/ fair ROM; no JVD; normal carotid impulses w/o bruits; no thyromegaly or nodules palpated; no lymphadenopathy.    RESP  Clear  P & A; w/o, wheezes/ rales/ or rhonchi. no accessory muscle use, no dullness to percussion  CARD:  RRR, no m/r/g, no peripheral edema, pulses intact, no cyanosis or clubbing.  GI:   Soft & nt; nml bowel sounds; no organomegaly or masses detected.   Musco: Warm bil, no deformities or joint swelling noted.   Neuro: alert, no focal deficits noted.    Skin: Warm, no lesions or rashes    Lab Results:     Imaging: CUP PACEART REMOTE DEVICE CHECK  Result Date: 08/29/2019 Scheduled remote reviewed.  Normal device function.  Next remote 91 days. Kathy Breach, RN, CCDS, [employer]     PFT Results Latest Ref Rng & Units 02/17/2015  FVC-Pre L 2.23  FVC-Predicted Pre % 80  FVC-Post L 2.49  FVC-Predicted Post % 89  Pre FEV1/FVC % % 75  Post FEV1/FCV % % 79  FEV1-Pre L 1.67  FEV1-Predicted Pre % 79  FEV1-Post L 1.97  DLCO UNC% % 107  DLCO COR %Predicted % 117  TLC L 4.32  TLC % Predicted % 93  RV % Predicted % 70    Lab Results  Component Value Date   NITRICOXIDE 82 08/27/2018        Assessment & Plan:   Mild persistent asthma in adult without  complication Controlled on current regimen    Plan  Patient Instructions  Set up split night sleep study .  Continue on Dulera 2 puffs Twice daily  , rinse after use.  Use Xopenex As needed for wheezing and shortness of breath .  Activity as tolerated.  Covid vaccine as planned.  Follow up in 3 -4 months with Dr. Loanne Drilling or Kavina Cantave NP and As needed   Please contact office for sooner follow up if symptoms do not improve or worsen or seek emergency care        Sleep apnea Concern for underlying OSA with daytime sleepiness , snoring and restless sleep  Set up for Split night (has CHF)   Chronic systolic heart failure ([place]) Appears to be controlled on present regimen .      Rexene Edison, NP 09/18/2019

## 2019-09-18 NOTE — Patient Instructions (Addendum)
Set up split night sleep study .  Continue on Dulera 2 puffs Twice daily  , rinse after use.  Use Xopenex As needed for wheezing and shortness of breath .  Activity as tolerated.  Covid vaccine as planned.  Follow up in 3 -4 months with Dr. Loanne Drilling or Davin Archuletta NP and As needed   Please contact office for sooner follow up if symptoms do not improve or worsen or seek emergency care

## 2019-09-18 NOTE — Patient Instructions (Signed)
Shingles  Shingles is an infection. It gives you a painful skin rash and blisters that have fluid in them. Shingles is caused by the same germ (virus) that causes chickenpox. Shingles only happens in people who:  Have had chickenpox.  Have been given a shot of medicine (vaccine) to protect against chickenpox. Shingles is rare in this group. The first symptoms of shingles may be itching, tingling, or pain in an area on your skin. A rash will show on your skin a few days or weeks later. The rash is likely to be on one side of your body. The rash usually has a shape like a belt or a band. Over time, the rash turns into fluid-filled blisters. The blisters will break open, change into scabs, and dry up. Medicines may:  Help with pain and itching.  Help you get better sooner.  Help to prevent long-term problems. Follow these instructions at home: Medicines  Take over-the-counter and prescription medicines only as told by your doctor.  Put on an anti-itch cream or numbing cream where you have a rash, blisters, or scabs. Do this as told by your doctor. Helping with itching and discomfort   Put cold, wet cloths (cold compresses) on the area of the rash or blisters as told by your doctor.  Cool baths can help you feel better. Try adding baking soda or dry oatmeal to the water to lessen itching. Do not bathe in hot water. Blister and rash care  Keep your rash covered with a loose bandage (dressing).  Wear loose clothing that does not rub on your rash.  Keep your rash and blisters clean. To do this, wash the area with mild soap and cool water as told by your doctor.  Check your rash every day for signs of infection. Check for: ? More redness, swelling, or pain. ? Fluid or blood. ? Warmth. ? Pus or a bad smell.  Do not scratch your rash. Do not pick at your blisters. To help you to not scratch: ? Keep your fingernails clean and cut short. ? Wear gloves or mittens when you sleep, if  scratching is a problem. General instructions  Rest as told by your doctor.  Keep all follow-up visits as told by your doctor. This is important.  Wash your hands often with soap and water. If soap and water are not available, use hand sanitizer. Doing this lowers your chance of getting a skin infection caused by germs (bacteria).  Your infection can cause chickenpox in people who have never had chickenpox or never got a shot of chickenpox vaccine. If you have blisters that did not change into scabs yet, try not to touch other people or be around other people, especially: ? Babies. ? Pregnant women. ? Children who have areas of red, itchy, or rough skin (eczema). ? Very old people who have transplants. ? People who have a long-term (chronic) sickness, like cancer or AIDS. Contact a doctor if:  Your pain does not get better with medicine.  Your pain does not get better after the rash heals.  You have any signs of infection in the rash area. These signs include: ? More redness, swelling, or pain around the rash. ? Fluid or blood coming from the rash. ? The rash area feeling warm to the touch. ? Pus or a bad smell coming from the rash. Get help right away if:  The rash is on your face or nose.  You have pain in your face or pain by   your eye.  You lose feeling on one side of your face.  You have trouble seeing.  You have ear pain, or you have ringing in your ear.  You have a loss of taste.  Your condition gets worse. Summary  Shingles gives you a painful skin rash and blisters that have fluid in them.  Shingles is an infection. It is caused by the same germ (virus) that causes chickenpox.  Keep your rash covered with a loose bandage (dressing). Wear loose clothing that does not rub on your rash.  If you have blisters that did not change into scabs yet, try not to touch other people or be around people. This information is not intended to replace advice given to you by  your health care provider. Make sure you discuss any questions you have with your health care provider. Document Revised: 11/09/2018 Document Reviewed: 03/22/2017 Elsevier Patient Education  2020 Elsevier Inc.  

## 2019-09-18 NOTE — Assessment & Plan Note (Signed)
Concern for underlying OSA with daytime sleepiness , snoring and restless sleep  Set up for Split night (has CHF)

## 2019-09-18 NOTE — Addendum Note (Signed)
Addended by: Parke Poisson E on: 09/18/2019 11:22 AM   Modules accepted: Orders

## 2019-09-18 NOTE — Assessment & Plan Note (Signed)
Appears to be controlled on present regimen .

## 2019-09-18 NOTE — Assessment & Plan Note (Signed)
A1c elevated 7.4 but still within goal of less than 8.  Advised healthy diet, limiting rice as well as sweets.  Patient is exercising daily which I encouraged.  Advised to have eye exam, patient will call her eye doctor to schedule this.  Continue Metformin 1000 mg twice daily.  Follow-up in 3 months.  Foot exam updated today.

## 2019-09-18 NOTE — Assessment & Plan Note (Addendum)
Patient with significant back pain even to minimal palpation of short.  Differentials include possible compression fracture given age.  She also with remote trauma history but no pain directly after fall so less likely related to this fall.  Can also consider shingles given that patient has dermatomal pattern pain that goes from her back to her foot and has pain with minimal palpation.  Will obtain lumbar spine x-ray films to rule out fracture.  We will plan to treat empirically with valacyclovir for possible shingles.  Patient did have shingles vaccine but states that she even got a shingles rash after the vaccine.  Strict return precautions given.  Follow-up in 2 weeks.  Sooner if worsening.

## 2019-09-19 ENCOUNTER — Encounter: Payer: Self-pay | Admitting: Family Medicine

## 2019-09-20 NOTE — Progress Notes (Signed)
EPIC Encounter for ICM Monitoring  Patient Name: Veronica Shaw is a 69 y.o. female Date: 09/20/2019 Primary Care Physican: Caroline More, DO Primary Cardiologist:Cooper/Weaver PA Electrophysiologist: Lovena Le 08/14/2019 Weight: 140lbs  Spoke with patient and she is feeling fine.  OptiVolThoracic impedancenormal.  Prescribed:Furosemide40 mgTake 0.5 tablets (20 mg total) by mouthevery other dayas needed for fluid (For swelling).   Labs: 03/05/2019 Creatinine 1.31, BUN 24, Potassium 4.9, Sodium 144, GFR 41-48 01/30/2019 Creatinine1.44, BUN22, Potassium4.5, Sodium139, [id number] A complete set of results can be found in Results Review.  Recommendations:No changes.  Follow-up plan: ICM clinic phone appointment on3/22/2021. 91 day device clinic remote transmission 11/28/2019.   Copy of ICM check sent to Dr.Taylor.  3 month ICM trend: 09/16/2019    1 Year ICM trend:       [name], RN 09/20/2019 3:10 PM

## 2019-09-23 DIAGNOSIS — H04123 Dry eye syndrome of bilateral lacrimal glands: Secondary | ICD-10-CM | POA: Diagnosis not present

## 2019-09-27 ENCOUNTER — Other Ambulatory Visit: Payer: Self-pay | Admitting: Pharmacy Technician

## 2019-09-27 NOTE — Patient Outreach (Signed)
[facility]) Care Management  09/27/2019  Veronica Shaw [birth year] [id number]   Care coordination call placed to [employer] in regards to patient's application for [facility] and Proventil.  Spoke to [place] who informed application was received on 09/19/2019 and attestation form was mailed to patient on 09/20/2019. Once attestation form is received back then the application process can continue.  Will outreach patient with this information.  Minda Faas P. Omelia Marquart, [place] Management [phone]

## 2019-09-27 NOTE — Patient Outreach (Signed)
[facility]) Care Management  09/27/2019  MADISUN STUCKMAN 1951-05-11 [id number]    ADDENDUM   Successful call placed to patient regarding patient assistance receipt of attestation form from [place] for [facility] and Proventil, HIPAA identifiers verified.   Patient informed she has already signed the attestation form but had not answered the questions. We discussed the form and she verbalized understanding. She informed she would fill the form out when she returns home and would place it and the application in the envelope [employer] provided and mail it this weekend.  Follow up:  Will follow up with [employer] in 10-15 business days to confirm receipt of attestation form.  Prince Couey P. Aliviya Schoeller, [place] Management [phone]

## 2019-09-30 ENCOUNTER — Other Ambulatory Visit (HOSPITAL_COMMUNITY)
Admission: RE | Admit: 2019-09-30 | Discharge: 2019-09-30 | Disposition: A | Discharge status: Home / Self Care | Payer: Medicare Other | Source: Ambulatory Visit | Attending: Cardiovascular Disease | Admitting: Cardiovascular Disease

## 2019-09-30 DIAGNOSIS — Z20822 Contact with and (suspected) exposure to covid-19: Secondary | ICD-10-CM | POA: Diagnosis not present

## 2019-09-30 DIAGNOSIS — Z01812 Encounter for preprocedural laboratory examination: Secondary | ICD-10-CM | POA: Diagnosis not present

## 2019-10-01 ENCOUNTER — Ambulatory Visit: Payer: Medicare Other | Attending: Internal Medicine

## 2019-10-01 ENCOUNTER — Ambulatory Visit: Payer: Medicare Other

## 2019-10-01 DIAGNOSIS — Z23 Encounter for immunization: Secondary | ICD-10-CM | POA: Insufficient documentation

## 2019-10-01 LAB — SARS CORONAVIRUS 2 (TAT 6-24 HRS): SARS Coronavirus 2: NEGATIVE

## 2019-10-01 NOTE — Progress Notes (Signed)
   Covid-19 Vaccination Clinic  Name:  Veronica Shaw    MRN: [id number] DOB: 07/08/1951  10/01/2019  [name] was observed post Covid-19 immunization for 15 minutes without incident. She was provided with Vaccine Information Sheet and instruction to access the V-Safe system.   [name] was instructed to call 911 with any severe reactions post vaccine: [name] Kitchen Difficulty breathing  . Swelling of face and throat  . A fast heartbeat  . A bad rash all over body  . Dizziness and weakness   Immunizations Administered    Name Date Dose VIS Date Route   Pfizer COVID-19 Vaccine 10/01/2019  1:12 PM 0.3 mL 07/12/2019 Intramuscular   Manufacturer: [place]   Lot: [id number]   [place]: [id number]

## 2019-10-03 ENCOUNTER — Ambulatory Visit (HOSPITAL_BASED_OUTPATIENT_CLINIC_OR_DEPARTMENT_OTHER): Payer: Medicare Other | Attending: Adult Health | Admitting: Pulmonary Disease

## 2019-10-03 ENCOUNTER — Other Ambulatory Visit: Payer: Self-pay

## 2019-10-03 DIAGNOSIS — I493 Ventricular premature depolarization: Secondary | ICD-10-CM | POA: Diagnosis not present

## 2019-10-03 DIAGNOSIS — G478 Other sleep disorders: Secondary | ICD-10-CM | POA: Diagnosis not present

## 2019-10-03 DIAGNOSIS — R0683 Snoring: Secondary | ICD-10-CM | POA: Diagnosis not present

## 2019-10-03 DIAGNOSIS — G4730 Sleep apnea, unspecified: Secondary | ICD-10-CM

## 2019-10-04 ENCOUNTER — Ambulatory Visit: Payer: Medicare Other | Admitting: Family Medicine

## 2019-10-08 ENCOUNTER — Telehealth: Payer: Self-pay | Admitting: Family Medicine

## 2019-10-08 DIAGNOSIS — G4737 Central sleep apnea in conditions classified elsewhere: Secondary | ICD-10-CM | POA: Diagnosis not present

## 2019-10-08 NOTE — Telephone Encounter (Signed)
Pt walked in to present a Disability Placard for parking to be completed. Last appt. Was 10/04/19 Form placed in the red team folder

## 2019-10-08 NOTE — Telephone Encounter (Signed)
Reviewed form for placard and placed in PCP's box for completion.  Veronica Shaw, [place]

## 2019-10-08 NOTE — Procedures (Signed)
Patient Name: Veronica Shaw, Veronica Shaw Date: 10/03/2019 Gender: Female D.O.B: 07-07-1951 Age (years): 33 Referring Provider: Tammy Parrett Height (inches): 60 Interpreting Physician: Kara Mead MD, ABSM Weight (lbs): 140 RPSGT: Jorge Ny BMI: 27 MRN: [id number] Neck Size: 14.00 <br> <br> CLINICAL INFORMATION Sleep Study Type: NPSG    Indication for sleep study: Congestive Heart Failure, Diabetes, Hypertension, Witnessed Apneas    Epworth Sleepiness Score: 10    SLEEP STUDY TECHNIQUE As per the AASM Manual for the Scoring of Sleep and Associated Events v2.3 (April 2016) with a hypopnea requiring 4% desaturations.  The channels recorded and monitored were frontal, central and occipital EEG, electrooculogram (EOG), submentalis EMG (chin), nasal and oral airflow, thoracic and abdominal wall motion, anterior tibialis EMG, snore microphone, electrocardiogram, and pulse oximetry.  MEDICATIONS Medications self-administered by patient taken the night of the study : N/A  SLEEP ARCHITECTURE The study was initiated at 10:31:22 PM and ended at 5:30:58 AM.  Sleep onset time was 30.0 minutes and the sleep efficiency was 44.1%%. The total sleep time was 185 minutes.  Stage REM latency was 64.0 minutes.  The patient spent 4.1%% of the night in stage N1 sleep, 78.4%% in stage N2 sleep, 0.0%% in stage N3 and 17.6% in REM.  Alpha intrusion was absent.  Supine sleep was 100.00%.  RESPIRATORY PARAMETERS The overall apnea/hypopnea index (AHI) was 2.9 per hour. There were 0 total apneas, including 0 obstructive, 0 central and 0 mixed apneas. There were 9 hypopneas and 13 RERAs with RDI of 6.8/h.  The AHI during Stage REM sleep was 5.5 per hour.  AHI while supine was 2.9 per hour.  The mean oxygen saturation was 90.6%. The minimum SpO2 during sleep was 86.0%.  moderate snoring was noted during this study.  CARDIAC DATA The 2 lead EKG demonstrated pacemaker generated. The mean  heart rate was 75.4 beats per minute. Other EKG findings include: PVCs. LEG MOVEMENT DATA The total PLMS were 0 with a resulting PLMS index of 0.0. Associated arousal with leg movement index was 0.0 .  IMPRESSIONS - No significant obstructive sleep apnea occurred during this study (AHI = 2.9/h). - No significant central sleep apnea occurred during this study (CAI = 0.0/h). - Mild oxygen desaturation was noted during this study (Min O2 = 86.0%). - The patient snored with moderate snoring volume. - EKG findings include PVCs. - Clinically significant periodic limb movements did not occur during sleep. No significant associated arousals.  DIAGNOSIS Upper airway resistance syndrome  RECOMMENDATIONS - PAP is not indicated for this degree of sleep disordered breathing. PLMs were not significant. Consider other causes for sleepiness such as medications - Avoid alcohol, sedatives and other CNS depressants that may worsen sleep apnea and disrupt normal sleep architecture. - Sleep hygiene should be reviewed to assess factors that may improve sleep quality. - Weight management and regular exercise should be initiated or continued if appropriate.   Kara Mead MD Board Certified in [place]

## 2019-10-09 ENCOUNTER — Other Ambulatory Visit: Payer: Self-pay | Admitting: Adult Health

## 2019-10-09 ENCOUNTER — Other Ambulatory Visit: Payer: Self-pay | Admitting: Pharmacy Technician

## 2019-10-09 DIAGNOSIS — J4530 Mild persistent asthma, uncomplicated: Secondary | ICD-10-CM

## 2019-10-09 NOTE — Patient Outreach (Signed)
[facility]) Care Management  10/09/2019  Veronica Shaw Apr 07, 1951 [id number]    Care coordination call placed to [employer] in regards to patient's application for [facility] and Proventil.  Spoke to Angelica who informed patient is APPROVED 10/08/2019-07/31/2020.  She informed it takes up to 3 business days for the pharmacy to receive the request and process the prescription and another 7-10 business days for the patient to receive the medications.  Will follow up with patient in 10-15 business days to confirm receipt of medication.  Jasaun Carn P. Sherryn Pollino, [place]  [phone]

## 2019-10-10 NOTE — Telephone Encounter (Signed)
Form completed. Placed in RN box  Caroline More, DO, PGY-3 [facility] 10/10/2019 9:59 AM

## 2019-10-10 NOTE — Telephone Encounter (Signed)
Patient informed form is ready for pick up. Placed up front.

## 2019-10-11 ENCOUNTER — Ambulatory Visit (INDEPENDENT_AMBULATORY_CARE_PROVIDER_SITE_OTHER): Payer: Medicare Other | Admitting: Adult Health

## 2019-10-11 ENCOUNTER — Encounter: Payer: Self-pay | Admitting: Adult Health

## 2019-10-11 ENCOUNTER — Other Ambulatory Visit: Payer: Self-pay

## 2019-10-11 DIAGNOSIS — R0902 Hypoxemia: Secondary | ICD-10-CM

## 2019-10-11 DIAGNOSIS — J4541 Moderate persistent asthma with (acute) exacerbation: Secondary | ICD-10-CM

## 2019-10-11 DIAGNOSIS — I5022 Chronic systolic (congestive) heart failure: Secondary | ICD-10-CM

## 2019-10-11 NOTE — Progress Notes (Signed)
@Patient  ID: Veronica Shaw, female    DOB: 1951-01-30, 69 y.o.   MRN: [id number]  Chief Complaint  Patient presents with  . Follow-up    Asthma    Virtual Visit via Telephone Note  I connected with Veronica Shaw on 10/11/19 at  9:00 AM EST by telephone and verified that I am speaking with the correct person using two identifiers.  Location: Patient: Home   Provider: Office    I discussed the limitations, risks, security and privacy concerns of performing an evaluation and management service by telephone and the availability of in person appointments. I also discussed with the patient that there may be a patient responsible charge related to this service. The patient expressed understanding and agreed to proceed.   HPI: 69 year old female never smoker followed for chronic asthma and upper airway cough Medical history significant for  congestive heart failure, osteoarthritis, possible rheumatoid arthritis, Parkinson's disease, chronic kidney disease stage III, diabetes, GERD and depression  TEST/EVENTS :  11/15/2018-CTA-negative for PE, left lower lobe calcified granulomas, no acute pulmonary process  Pulmonaryfunction testing: Spirometry June 2016 ratio 62%, FEV1 0.98 L 46% predicted July 2016 ratio normal, FEV1 1.97 L 93% predicted, 17% change with bronchodilator, FVC is 2.49 L 89% predicted, total lung capacity 4.32 L 93% predicted, DLCO 21.77 107% predicted January 2017 simple spirometry ratio 71%, FEV1 1.32 L 65% predicted  Lexiscan 11/2016- no ischemia   Spirometry January 2017 FEV1 65%, ratio 71, FVC 72% PFT July 2016 FEV1 93%, ratio 79, FVC 89%, significant bronchodilator response, DLCO is 107%. 2D echo March 2019 EF 123456, grade 2 diastolic dysfunction  Exhaled nitric oxide testing: January 2017 79 ppm Plan Sleep study done October 03, 2019 showed no significant sleep apnea (AHI 2.9/hour), mild oxygen desaturation at 86%, positive snoring, positive periodic limb  movements no significant associated arousals.  Today;'s Televisit is for follow up of Asthma and Sleep study results.   Patient presents for a 1 month follow-up.  Patient has underlying asthma she is on Dulera twice daily.  She says she tries to stay active she keeps her grandson and does light housework but seems to get harder to do things as time goes on. .  She says she has a low activity tolerance, wears out easily .  Gets winded with light activities. Has used her xopenex nebs on occasion, helps some. No increased cough . Occasional wheeze but mainly in throat wheezing . No chest pain , hemoptysis or fever.  Patient gets her Dulera and albuterol through a patient assistance program  Last visit patient had complained of some daytime sleepiness and restless sleep.  A sleep study was done that showed no significant sleep apnea.  AHI was 2.9/hour.  There was some mild desaturations with SaO2 low at 86%. We discussed checking an overnight oximetry test.   Has Systolic and Diastolic CHF , has noticed more puffiness all over, no increased weight gain . Took Lasix yesterday . Some increased dyspnea last night . Weighs daily , no significant increases   Has received both of her Covid vaccines   Allergies  Allergen Reactions  . Aspirin Swelling and Other (See Comments)    Other reaction(s): Other (See Comments) Causes nose bleeds *ONLY THE COATED ASA* Causes nose bleeds *ONLY THE COATED ASA*  . Penicillins Shortness Of Breath and Rash    Shortness of Breath - Throat felt like it was closing.   Veronica Shaw [Conj Estrog-Medroxyprogest Ace] Shortness Of Breath  Throat swelling Throat swelling  . Ace Inhibitors Cough  . Simvastatin Other (See Comments) and Rash    Muscle aches    Immunization History  Administered Date(s) Administered  . Influenza Split 06/17/2011, 04/18/2012  . Influenza Whole 05/17/2007, 06/14/2010  . Influenza,inj,Quad PF,6+ Mos 05/20/2014, 06/04/2015, 03/22/2016,  05/30/2017, 04/12/2018  . Influenza,inj,Quad PF,6-35 Mos 05/03/2013  . Influenza-Unspecified 04/01/2019  . PFIZER SARS-COV-2 Vaccination 09/05/2019, 10/01/2019  . Pneumococcal Conjugate-13 05/20/2014  . Pneumococcal Polysaccharide-23 05/17/2007, 06/01/2013, 03/05/2019  . Td 03/01/2006  . Zoster 08/01/2009    Past Medical History:  Diagnosis Date  . Asthma   . Chronic systolic CHF (congestive heart failure) ([place])    a. cMRI 4/15: EF 34% and findings - c/w NICM, normal RV size and function (RVEF 61%), Mild MR // b. Echo 2/15:  EF 30-35%, diff HK, ant-sept AK, Gr 2 DD, mild MR, trivial TR  //  c. Echo 5/17: EF 20-25%, severe diffuse HK, marked systolic dyssynchrony, grade 1 diastolic dysfunction, mild MR  //  d. RHC 5/17: Fick CO 2.9, RVSP 19, PASP 15, PW mean 2, low filing pressures and preserved CO   . Diabetes mellitus   . Flu 10/17/2017  . Gastritis   . History of echocardiogram    Echo 6/18: EF 30-35, diffuse HK, grade 1 diastolic dysfunction, trivial MR, mild LAE, mild TR, no pericardial effusion  . History of nuclear stress test    Myoview 5/18: EF 49, no ischemia, inferoseptal defect c/w LBBB artifact (intermediate risk due to EF < 50).  [name] Kitchen HTN (hypertension)   . Hyperlipidemia   . NICM (nonischemic cardiomyopathy) ([place])    a. Nuclear 5/13: Normal stress nuclear study. LV Ejection Fraction: 58%  //  b. LHC 10/14: Minor luminal irregularity in prox LAD, EF 35%   . Parkinson disease ([place])   . Plantar fasciitis   . Sleep apnea    was retested and no longer had it and so d/c CPAP  . Urticaria     Tobacco History: Social History   Tobacco Use  Smoking Status Never Smoker  Smokeless Tobacco Never Used  Tobacco Comment   exposed to smoke during child hood (parents)   Counseling given: Not Answered Comment: exposed to smoke during child hood (parents)   Outpatient Medications Prior to Visit  Medication Sig Dispense Refill  . albuterol (PROVENTIL HFA;VENTOLIN HFA) 108 (90  Base) MCG/ACT inhaler Inhale 2 puffs into the lungs every 4 (four) hours as needed for wheezing or shortness of breath. 18 g 3  . Biotin 5000 MCG CAPS Take 1 tablet by mouth daily.    . Calcium Carb-Cholecalciferol 600-800 MG-UNIT TABS Take 1 tablet by mouth daily. 90 tablet 3  . carbidopa-levodopa (SINEMET IR) 25-100 MG tablet TAKE 1 TABLET BY MOUTH THREE TIMES DAILY 270 tablet 0  . famotidine (PEPCID) 20 MG tablet TAKE 1 TABLET TWICE DAILY 180 tablet 2  . fluticasone (FLONASE SENSIMIST) 27.5 MCG/SPRAY nasal spray Place 2 sprays into the nose daily. 10 g 1  . furosemide (LASIX) 40 MG tablet TAKE 1/2 TABLET EVERY DAY AS NEEDED FOR  FLUID/SWELLING 30 tablet 3  . gabapentin (NEURONTIN) 100 MG capsule Take 300mg  (3 pills) at night before bed and 100mg  (1 pill) in the morning with breakfast. (Patient taking differently: as needed. Take 300mg  (3 pills) at night before bed and 100mg  (1 pill) in the morning with breakfast.) 120 capsule 1  . levalbuterol (XOPENEX) 0.63 MG/3ML nebulizer solution 1 vial every 4-6 hours as needed  for wheezing and shortness of breath. 180 mL 5  . losartan (COZAAR) 25 MG tablet TAKE 1 TABLET EVERY DAY 90 tablet 3  . meclizine (ANTIVERT) 12.5 MG tablet Take 0.5 tablets (6.25 mg total) by mouth 3 (three) times daily as needed for dizziness. 30 tablet 0  . metFORMIN (GLUCOPHAGE) 500 MG tablet Take 2 tablets (1,000 mg total) by mouth 2 (two) times daily with a meal. 180 tablet 1  . methocarbamol (ROBAXIN) 750 MG tablet Take 750 mg by mouth as needed.     . metoprolol succinate (TOPROL-XL) 50 MG 24 hr tablet TAKE 1 TABLET TWICE DAILY 180 tablet 3  . mirtazapine (REMERON) 15 MG tablet Take 1 tablet (15 mg total) by mouth at bedtime. (Patient taking differently: Take 15 mg by mouth as needed. ) 90 tablet 1  . mometasone-formoterol (DULERA) 200-5 MCG/ACT AERO Inhale 2 puffs into the lungs 2 (two) times daily. 3 Inhaler 3  . potassium chloride (K-DUR,KLOR-CON) 10 MEQ tablet Take 1 tablet  (10 mEq total) by mouth as needed (take with lasix as needed for swelling). 30 tablet 2  . pravastatin (PRAVACHOL) 20 MG tablet Take 1 tablet (20 mg total) by mouth at bedtime. 90 tablet 0  . spironolactone (ALDACTONE) 25 MG tablet TAKE 1/2 TABLET EVERY DAY 45 tablet 3  . Turmeric 500 MG TABS Take 1 tablet by mouth daily.    . valACYclovir (VALTREX) 1000 MG tablet Take 1 tablet (1,000 mg total) by mouth 3 (three) times daily. 20 tablet 0   No facility-administered medications prior to visit.     Lab Results:      PFT Results Latest Ref Rng & Units 02/17/2015  FVC-Pre L 2.23  FVC-Predicted Pre % 80  FVC-Post L 2.49  FVC-Predicted Post % 89  Pre FEV1/FVC % % 75  Post FEV1/FCV % % 79  FEV1-Pre L 1.67  FEV1-Predicted Pre % 79  FEV1-Post L 1.97  DLCO UNC% % 107  DLCO COR %Predicted % 117  TLC L 4.32  TLC % Predicted % 93  RV % Predicted % 70       Observations/Objective: Speaks in full sentences .   Assessment and Plan: 1. Asthma -appears to be compensated.  Patient is continue on Dulera.  May use Xopenex as needed.  She does complain of some postnasal drainage.  Advised to add in saline nasal spray and continue on Flonase.   2. CHF -diastolic and systolic CHF.  Recent echo showed improved EF.  Patient has grade 2 diastolic dysfunction.  Patient continue on a low-salt diet.  May use Lasix as needed for increased edema and fluid retention. Will refer to cardiology for follow-up  3. Nocturnal Hypoxia -sleep study negative for sleep apnea.  Did notice some mild hypoxemia.  We will check an overnight oximetry test  Plan  Patient Instructions  Add Saline nasal spray Twice daily   Flonase daily .  Set up overnight oximetry test.  Continue on Dulera 2 puffs Twice daily  , rinse after use.  Use Xopenex As needed for wheezing and shortness of breath  Activity as tolerated.  Refer for Cardiology follow up  Follow up with Nephrology as planned.  Follow up in 2-3 with Dr.  Vaughan Browner   or Demone Lyles NP and As needed   Please contact office for sooner follow up if symptoms do not improve or worsen or seek emergency care         Follow Up Instructions: Follow up in 2 months  and As needed     Please contact office for sooner follow up if symptoms do not improve or worsen or seek emergency care   I discussed the assessment and treatment plan with the patient. The patient was provided an opportunity to ask questions and all were answered. The patient agreed with the plan and demonstrated an understanding of the instructions.   The patient was advised to call back or seek an in-person evaluation if the symptoms worsen or if the condition fails to improve as anticipated.  I provided 25 minutes of non-face-to-face time during this encounter.   Rexene Edison, NP 10/11/2019

## 2019-10-11 NOTE — Addendum Note (Signed)
Addended by: Tery Sanfilippo R on: 10/11/2019 09:46 AM   Modules accepted: Orders

## 2019-10-11 NOTE — Patient Instructions (Addendum)
Add Saline nasal spray Twice daily   Flonase daily .  Set up overnight oximetry test.  Continue on Dulera 2 puffs Twice daily  , rinse after use.  Use Xopenex As needed for wheezing and shortness of breath  Activity as tolerated.  Refer for Cardiology follow up  Follow up with Nephrology as planned.  Follow up in 2-3 with Dr. Vaughan Browner   or Ariellah Faust NP and As needed   Please contact office for sooner follow up if symptoms do not improve or worsen or seek emergency care

## 2019-10-16 ENCOUNTER — Encounter: Payer: Self-pay | Admitting: Adult Health

## 2019-10-17 ENCOUNTER — Other Ambulatory Visit: Payer: Self-pay | Admitting: Pharmacy Technician

## 2019-10-17 ENCOUNTER — Other Ambulatory Visit: Payer: Self-pay

## 2019-10-17 ENCOUNTER — Telehealth: Payer: Self-pay

## 2019-10-17 NOTE — Telephone Encounter (Signed)
Patient calls nurse line stating that she missed a phone call from [facility] for AWV.   To Page  Talbot Grumbling, RN

## 2019-10-17 NOTE — Patient Outreach (Signed)
[place] [facility]) Care Management  10/17/2019  AZALAYA SHIVLEY [birth year] [id number]  Care coordination call placed to [employer] in regards to patinet's application for [facility] and Proventil HFA.  Spoke to [place] who informed she requested the medication refill with the pharmacy today. She informed the patient should receive the medication at her home in the next 3-5 business days.  Will follow up with patient in 5-7 business days to confirm receipt of medication.  Debara Kamphuis P. Jessy Cybulski, [place]  [phone]

## 2019-10-18 ENCOUNTER — Other Ambulatory Visit: Payer: Self-pay | Admitting: Pharmacist

## 2019-10-18 DIAGNOSIS — J4520 Mild intermittent asthma, uncomplicated: Secondary | ICD-10-CM | POA: Diagnosis not present

## 2019-10-18 NOTE — Patient Outreach (Signed)
[place] [facility]) Care Management  10/18/2019  SEQUOYAH PIESTER January 31, 1951 [id number]  Patient's case is being closed as the [place] Team has transitioned to the Quality Department and will no longer document in CHL.  Patient will continue to be followed by [place] Technician, Susy Frizzle for the continuation of the patient assistance process.  Elayne Guerin, PharmD, [place] Clinical Pharmacist [phone]

## 2019-10-21 ENCOUNTER — Ambulatory Visit (INDEPENDENT_AMBULATORY_CARE_PROVIDER_SITE_OTHER): Payer: Medicare Other

## 2019-10-21 DIAGNOSIS — I5022 Chronic systolic (congestive) heart failure: Secondary | ICD-10-CM | POA: Diagnosis not present

## 2019-10-21 DIAGNOSIS — Z95810 Presence of automatic (implantable) cardiac defibrillator: Secondary | ICD-10-CM | POA: Diagnosis not present

## 2019-10-22 ENCOUNTER — Telehealth: Payer: Self-pay | Admitting: Adult Health

## 2019-10-22 DIAGNOSIS — R0902 Hypoxemia: Secondary | ICD-10-CM

## 2019-10-22 NOTE — Telephone Encounter (Signed)
Pt's ONO report is in Tammy's look at folder.  Tammy - please advise on results. Thanks!

## 2019-10-22 NOTE — Telephone Encounter (Signed)
Spoke with pt and notified of results per Rexene Edison, NP. Pt verbalized understanding and denied any questions. Order sent to [facility].

## 2019-10-22 NOTE — Telephone Encounter (Signed)
Overnight oximetry test did show nocturnal desaturations/hypoxemia Recommend oxygen at 2 L at bedtime We will discuss in detail on follow-up visit

## 2019-10-23 ENCOUNTER — Ambulatory Visit: Payer: Self-pay | Admitting: Pharmacist

## 2019-10-23 ENCOUNTER — Other Ambulatory Visit: Payer: Self-pay | Admitting: Pharmacist

## 2019-10-23 NOTE — Patient Outreach (Signed)
[place]) Care Management  10/23/2019  Veronica Shaw 16-Dec-1950 [id number]   Patient was called regarding medication assistance. HIPAA identifiers were obtained. Patient had questions about the [employer] form. We reviewed the requirements and instructions for the form and she communicated understanding.  [employer] will stop including Dulera in its program 10/31/2019. As such, a note will be sent to the patient's provider to request a therapeutic switch to Symbicort.  Plan: Send note to Wyn Quaker, NP Follow up within 2 weeks.  Elayne Guerin, PharmD, [place] Clinical Pharmacist [phone]

## 2019-10-24 ENCOUNTER — Other Ambulatory Visit: Payer: Self-pay

## 2019-10-24 MED ORDER — BUDESONIDE-FORMOTEROL FUMARATE 160-4.5 MCG/ACT IN AERO: 2.0000 | INHALATION_SPRAY | Freq: Two times a day (BID) | RESPIRATORY_TRACT | 0 refills | Status: DC

## 2019-10-24 NOTE — Patient Outreach (Deleted)
[facility] [facility]) Care Management  10/24/2019  Veronica Shaw 02/08/1951 [id number]   Called patient back from her call last week. HIPAA identifiers were obtained.  She had questions about the attestation form from [employer]. Patient was walked through the process. She communicated understanding.  Plan: Patient's case is being closed as the [place] Team has transitioned to the Quality Department and will no longer document in CHL.  Patient will continue to be followed by [place] Technician, Susy Frizzle, CPhT  Elayne Guerin, PharmD, [place] Clinical Pharmacist [phone]

## 2019-10-24 NOTE — Patient Outreach (Addendum)
[place] [facility]) Care Management  10/24/2019  METZLI BUNTEN 1951/05/26 [id number]   Patient was called regarding medication assistance. HIPAA identifiers were obtained. She called me last week and had questions about the attestation form from [employer].   Patient's questions were answered and she communicated understanding. It was also explained to her that [employer] was planning to discontinue Dulera from [employer] Patient Assistance Program.   As such, the patient's provider Wyn Quaker, NP) was messaged to inquire about a therapeutic switch from [facility] to Symbicort. He messaged me back and said the switch was acceptable.  Plan: I will route patient assistance letter to [facility] technician who will coordinate patient assistance program application process for medications listed above.  [facility] pharmacy technician will assist with obtaining all required documents from both patient and provider(s) and submit application(s) once completed.   Patient's case is being closed as the [place] Team has transitioned to the Quality Department and will no longer document in CHL. Her Patient Assistance will still be followed by [place] Technician.  Elayne Guerin, PharmD, [place] Clinical Pharmacist [phone]

## 2019-10-25 ENCOUNTER — Telehealth: Payer: Self-pay | Admitting: Adult Health

## 2019-10-25 ENCOUNTER — Other Ambulatory Visit: Payer: Self-pay

## 2019-10-25 ENCOUNTER — Ambulatory Visit (INDEPENDENT_AMBULATORY_CARE_PROVIDER_SITE_OTHER): Payer: Medicare Other | Admitting: Adult Health

## 2019-10-25 ENCOUNTER — Other Ambulatory Visit: Payer: Self-pay | Admitting: Pharmacy Technician

## 2019-10-25 DIAGNOSIS — R0902 Hypoxemia: Secondary | ICD-10-CM

## 2019-10-25 DIAGNOSIS — J4541 Moderate persistent asthma with (acute) exacerbation: Secondary | ICD-10-CM

## 2019-10-25 NOTE — Telephone Encounter (Signed)
TP please advise on results.  Thanks!

## 2019-10-25 NOTE — Patient Instructions (Addendum)
Continue on Oxygen 2l/m At bedtime  .  Saline nasal spray Twice daily   Flonase daily .  Continue on Dulera 2 puffs Twice daily  , rinse after use.  Use Xopenex As needed for wheezing and shortness of breath  Activity as tolerated.  Follow up in 3  Months with Dr. Vaughan Browner   or Jamita Mckelvin NP and As needed   Please contact office for sooner follow up if symptoms do not improve or worsen or seek emergency care

## 2019-10-25 NOTE — Progress Notes (Signed)
Virtual Visit via Telephone Note  I connected with Veronica Shaw on 10/25/19 at 10:00 AM EDT by telephone and verified that I am speaking with the correct person using two identifiers.  Location: Patient: Home  Provider: Home    I discussed the limitations, risks, security and privacy concerns of performing an evaluation and management service by telephone and the availability of in person appointments. I also discussed with the patient that there may be a patient responsible charge related to this service. The patient expressed understanding and agreed to proceed.   History of Present Illness: 69 year old female never smoker followed for chronic asthma and upper airway cough Medical history significant for congestive heart failure, osteoarthritis, possible rheumatoid arthritis, Parkinson's disease, chronic kidney disease stage III, diabetes, GERD, depression  Today's televisit is a 2-week follow-up.  Patient recently had a sleep study completed due to her restless sleep pattern and daytime sleepiness.  Sleep study showed no significant sleep apnea.  Her AHI was 2.9/hour.  She did have some mild desaturations with an SaO2 low at 86%.  We set up an overnight oximetry test that showed nocturnal desaturations.  I have recommended her to begin nocturnal oxygen.  Patient says this was set up 2 days ago and has made a significant difference in her sleep quality and how she feels waking up in the morning.  She says overall her asthma is doing okay.  She says she did miss a couple days of Dulera and really notice it.  But she has now restarted and is feeling back to baseline.  She denies any increased cough or wheezing.     Patient Active Problem List   Diagnosis Date Noted  . Back pain 09/18/2019  . Post-nasal drip 06/06/2019  . Hyperlipidemia associated with type 2 diabetes mellitus ([place]) 03/05/2019  . Contact dermatitis 03/05/2019  . Cervical disc disorder with radiculopathy of cervical region  09/24/2018  . Cognitive impairment 10/19/2017  . Osteopenia 07/10/2015  . Mild persistent asthma in adult without complication [id number]  . NICM (nonischemic cardiomyopathy) ([place]) 04/23/2014  . Arthritis or polyarthritis, rheumatoid ([place]) 03/12/2014  . ICD (implantable cardioverter-defibrillator), dual, in situ 03/06/2014  . Thoracic or lumbosacral neuritis or radiculitis, unspecified 07/03/2013  . Chronic systolic heart failure ([place]) 05/31/2013  . Parkinson disease ([place]) 05/21/2013  . Major depression 03/31/2013  . Vertigo 03/03/2013  . GERD (gastroesophageal reflux disease) 09/27/2012  . Plantar fasciitis, bilateral 06/15/2012  . 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 Diabetic peripheral neuropathy ([place]) 12/09/2011  . Hyperlipidemia 12/09/2011  . Sleep apnea 12/09/2011  . Chronic urticaria 05/27/2011  . 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 05/20/2011  . ROSACEA 05/29/2009  . ACHILLES BURSITIS OR TENDINITIS 03/27/2009  . Type II diabetes mellitus with complication ([place]) [id number]  . Essential hypertension 09/28/2006   Current Outpatient Medications on File Prior to Visit  Medication Sig Dispense Refill  . albuterol (PROVENTIL HFA;VENTOLIN HFA) 108 (90 Base) MCG/ACT inhaler Inhale 2 puffs into the lungs every 4 (four) hours as needed for wheezing or shortness of breath. 18 g 3  . Biotin 5000 MCG CAPS Take 1 tablet by mouth daily.    . budesonide-formoterol (SYMBICORT) 160-4.5 MCG/ACT inhaler Inhale 2 puffs into the lungs 2 (two) times daily. 1 Inhaler 0  . Calcium Carb-Cholecalciferol 600-800 MG-UNIT TABS Take 1 tablet by mouth daily. 90 tablet 3  . carbidopa-levodopa (SINEMET IR) 25-100 MG tablet TAKE 1 TABLET BY MOUTH THREE TIMES DAILY 270 tablet 0  . famotidine (PEPCID) 20 MG tablet TAKE 1 TABLET  TWICE DAILY 180 tablet 2  . fluticasone (FLONASE SENSIMIST) 27.5 MCG/SPRAY nasal spray Place 2 sprays into the nose daily. 10 g 1  . furosemide (LASIX) 40 MG tablet TAKE 1/2 TABLET EVERY DAY AS NEEDED FOR   FLUID/SWELLING 30 tablet 3  . gabapentin (NEURONTIN) 100 MG capsule Take 300mg  (3 pills) at night before bed and 100mg  (1 pill) in the morning with breakfast. (Patient taking differently: as needed. Take 300mg  (3 pills) at night before bed and 100mg  (1 pill) in the morning with breakfast.) 120 capsule 1  . levalbuterol (XOPENEX) 0.63 MG/3ML nebulizer solution 1 vial every 4-6 hours as needed for wheezing and shortness of breath. 180 mL 5  . losartan (COZAAR) 25 MG tablet TAKE 1 TABLET EVERY DAY 90 tablet 3  . meclizine (ANTIVERT) 12.5 MG tablet Take 0.5 tablets (6.25 mg total) by mouth 3 (three) times daily as needed for dizziness. 30 tablet 0  . metFORMIN (GLUCOPHAGE) 500 MG tablet Take 2 tablets (1,000 mg total) by mouth 2 (two) times daily with a meal. 180 tablet 1  . methocarbamol (ROBAXIN) 750 MG tablet Take 750 mg by mouth as needed.     . metoprolol succinate (TOPROL-XL) 50 MG 24 hr tablet TAKE 1 TABLET TWICE DAILY 180 tablet 3  . mirtazapine (REMERON) 15 MG tablet Take 1 tablet (15 mg total) by mouth at bedtime. (Patient taking differently: Take 15 mg by mouth as needed. ) 90 tablet 1  . potassium chloride (K-DUR,KLOR-CON) 10 MEQ tablet Take 1 tablet (10 mEq total) by mouth as needed (take with lasix as needed for swelling). 30 tablet 2  . pravastatin (PRAVACHOL) 20 MG tablet Take 1 tablet (20 mg total) by mouth at bedtime. 90 tablet 0  . spironolactone (ALDACTONE) 25 MG tablet TAKE 1/2 TABLET EVERY DAY 45 tablet 3  . Turmeric 500 MG TABS Take 1 tablet by mouth daily.    . valACYclovir (VALTREX) 1000 MG tablet Take 1 tablet (1,000 mg total) by mouth 3 (three) times daily. 20 tablet 0   No current facility-administered medications on file prior to visit.    Observations/Objective: Speaks in full sentences with no audible distress or wheezing.  Assessment and Plan: Nocturnal hypoxemia-sleep study showed no significant sleep apnea.  Overnight oximetry test showed nocturnal desaturations.   Patient is doing well on oxygen at 2 L.  Asthma currently stable on present regimen  Plan  Patient Instructions  Continue on Oxygen 2l/m At bedtime  .  Saline nasal spray Twice daily   Flonase daily .  Continue on Dulera 2 puffs Twice daily  , rinse after use.  Use Xopenex As needed for wheezing and shortness of breath  Activity as tolerated.  Follow up in 3  Months with Dr. Vaughan Browner   or Jewels Langone NP and As needed   Please contact office for sooner follow up if symptoms do not improve or worsen or seek emergency care        Follow Up Instructions: Follow-up in 3 months and as needed   I discussed the assessment and treatment plan with the patient. The patient was provided an opportunity to ask questions and all were answered. The patient agreed with the plan and demonstrated an understanding of the instructions.   The patient was advised to call back or seek an in-person evaluation if the symptoms worsen or if the condition fails to improve as anticipated.  I provided 21 minutes of non-face-to-face time during this encounter.   Rexene Edison, NP

## 2019-10-25 NOTE — Progress Notes (Signed)
EPIC Encounter for ICM Monitoring  Patient Name: Veronica Shaw is a 69 y.o. female Date: 10/25/2019 Primary Care Physican: Caroline More, DO Primary Cardiologist:Cooper/Weaver PA Electrophysiologist: Lovena Le 3/26/2021Weight: 138lbs  Spoke with patient and reports feeling well at this time.  Denies fluid symptoms.    OptiVolThoracic impedancenormal.  Prescribed:Furosemide40 mgTake 0.5 tablets (20 mg total) by mouthevery other dayas needed for fluid (For swelling).   Labs: 03/05/2019 Creatinine 1.31, BUN 24, Potassium 4.9, Sodium 144, GFR 41-48 01/30/2019 Creatinine1.44, BUN22, Potassium4.5, Sodium139, [id number] A complete set of results can be found in Results Review.  Recommendations: No changes and encouraged to call if experiencing any fluid symptoms.  Follow-up plan: ICM clinic phone appointment on4/30/2021. 91 day device clinic remote transmission 11/28/2019.   Copy of ICM check sent to Dr.Taylor.   3 month ICM trend: 10/21/2019    1 Year ICM trend:       [name], RN 10/25/2019 1:40 PM

## 2019-10-25 NOTE — Telephone Encounter (Signed)
Please see visit from today as it was reviewed

## 2019-10-25 NOTE — Patient Outreach (Signed)
[name] Care Management  10/25/2019  Veronica Shaw 12/18/1950 [id number]  Care coordination call placed to [employer] to check on delivery status of patient's approved application for [facility] and Proventil HFA.  Spoke to [place] who informed patient would be receiving a 90 days supply of Dulera and a 50 days supply of Proventil HFA delivered to her home on 10/26/2019. She informed the medication was shipped out on 10/21/2019 and the tracking information shows a delivery date of 10/26/2019.  Will follow up with patient in 2-3 business days to confirm it was received and to discuss refill procedure.  Kameria Canizares P. Avyukt Cimo, [place]  [phone]

## 2019-10-28 ENCOUNTER — Other Ambulatory Visit: Payer: Self-pay | Admitting: Pharmacy Technician

## 2019-10-28 NOTE — Patient Outreach (Signed)
[place] [facility]) Care Management  10/28/2019  MIKYRA NIVENS [birth year] [id number]    Successful call placed to patient regarding patient assistance medication delivery of Dulera and Proventil HFA, HIPAA identifiers verified.   Patient informed she received 3 inhalers of each kind. Informed patient she was going to be mailed an application for Symbicort as the [facility] program was going away. Patient was agreeable and verbalized understanding.  Follow up:  Will mail applicaiton when back in the office.  Jameal Razzano P. Victoire Deans, [place]  [phone]

## 2019-10-29 ENCOUNTER — Ambulatory Visit: Payer: Medicare Other | Admitting: Adult Health

## 2019-10-29 ENCOUNTER — Other Ambulatory Visit: Payer: Self-pay | Admitting: Pharmacy Technician

## 2019-10-29 NOTE — Patient Outreach (Signed)
[place] [facility]) Care Management  10/29/2019  NYTASHA LOUCH 27-Apr-1951 [id number]                                      Medication Assistance Referral  Referral From: [place]  Medication/Company: Symbicort / [employer]  Patient application portion:  Mailed Provider application portion: Faxed  to Wyn Quaker, NP Provider address/fax verified via: Office website     Follow up:  Will follow up with patient in 10-14 business days to confirm application(s) have been received.  Tremaine Earwood P. Abrial Arrighi, [place]  [phone]

## 2019-10-31 NOTE — Progress Notes (Signed)
Cardiology Office Note:    Date:  11/01/2019   ID:  ELTA KAZAR, DOB 02-24-1951, MRN [id number]  PCP:  Caroline More, DO  Cardiologist:  Sherren Mocha, MD  Electrophysiologist:  Cristopher Peru, MD  Pulmonologist:Douglas Lake Bells, MD Neurologist:Rebecca Tat, DO  Referring MD: Melvenia Needles, NP   Chief Complaint:  Follow-up (CHF)    Patient Profile:    Veronica Shaw is a 69 y.o. female with:   Chronic systolic CHF  Non-ischemic cardiomyopathy   Cath 2014: no sig CAD  CMR 4/15: EF 34   Echocardiogram 5/17: EF 20-25  Echocardiogram 6/18: EF 30-35  Echocardiogram 3/19: EF 60-65, Gr 2 DD  S/p AICD  Diabetes mellitus   Hyperlipidemia   OSA   Asthma   Parkinson's Disease   Syncope 09/2017  Cervical spine DDD  Prior CV studies: Echo 10/18/17 EF 60-65, normal wall motion, grade 2 diastolic dysfunction  Echo 01/11/17 EF 30-35, diffuse HK, grade 1 diastolic dysfunction, trivial MR, mild LAE, mild TR  Nuclear stress test 12/29/16 Large size, moderate intensity fixed inferoseptal/septal perfusion defect consistent with LBBB artifact. No reversible ischemia. LVEF 49% with incoordinate septal motion. This is an intermediate risk study (due to LVEF <50).  RHC 12/16/15 CO 2.9; mean RA 1, PASP 15, mean PA 10, mean PCWP 2 1. Low intracardiac filling pressures 2. Preserved cardiac output  Echo 12/11/15 EF 20-25%, severe diffuse HK, marked systolic dyssynchrony, grade 1 diastolic dysfunction, mild MR  Cardiac MRI (4/15): 1. Mild LVE, EF 34%, Diff HK, paradoxical septal motion c/w LBBB, small focal area of late gadolinium enhancement at basal anteroseptum - may represent RV volume overload or a focal infiltrative disease such as sarcoidosis, but certainly wouldn't explain the degree of of LV dysfunction. - c/w NICM, normal RV size and function (RVEF 61%), Mild MR  LHC (10/14): Minor luminal irregularity in prox LAD, EF 35%  Echo (2/15): EF 30-35%, diff  HK, ant-sept AK, Gr 2 DD, mild MR, trivial TR  Nuclear (5/13): Normal stress nuclear study. LV Ejection Fraction: 58%  History of Present Illness:    Veronica Shaw returns for follow up of her CHF.  I last saw her in 08/2018.    She is here alone today.  Overall, she has been doing fairly well.  She has chronic dyspnea with exertion secondary to her asthma.  This is unchanged.  She sleeps on 4 pillows chronically.  She tells me that she was noted to have nocturnal hypoxia and was placed on O2 at night.  She has not had syncope or ICD discharges.  She has not had chest pain.  Of note, she recently had some abdominal discomfort and black stools.  This has resolved.    Past Medical History:  Diagnosis Date  . Asthma   . Chronic systolic CHF (congestive heart failure) ([place])    a. cMRI 4/15: EF 34% and findings - c/w NICM, normal RV size and function (RVEF 61%), Mild MR // b. Echo 2/15:  EF 30-35%, diff HK, ant-sept AK, Gr 2 DD, mild MR, trivial TR  //  c. Echo 5/17: EF 20-25%, severe diffuse HK, marked systolic dyssynchrony, grade 1 diastolic dysfunction, mild MR  //  d. RHC 5/17: Fick CO 2.9, RVSP 19, PASP 15, PW mean 2, low filing pressures and preserved CO   . Diabetes mellitus   . Flu 10/17/2017  . Gastritis   . History of echocardiogram    Echo 6/18: EF 30-35, diffuse HK, grade 1  diastolic dysfunction, trivial MR, mild LAE, mild TR, no pericardial effusion  . History of nuclear stress test    Myoview 5/18: EF 49, no ischemia, inferoseptal defect c/w LBBB artifact (intermediate risk due to EF < 50).  [name] Kitchen HTN (hypertension)   . Hyperlipidemia   . NICM (nonischemic cardiomyopathy) ([place])    a. Nuclear 5/13: Normal stress nuclear study. LV Ejection Fraction: 58%  //  b. LHC 10/14: Minor luminal irregularity in prox LAD, EF 35%   . Parkinson disease ([place])   . Plantar fasciitis   . Sleep apnea    was retested and no longer had it and so d/c CPAP  . Urticaria     Current Medications: Current  Meds  Medication Sig  . albuterol (PROVENTIL HFA;VENTOLIN HFA) 108 (90 Base) MCG/ACT inhaler Inhale 2 puffs into the lungs every 4 (four) hours as needed for wheezing or shortness of breath.  . Biotin 5000 MCG CAPS Take 1 tablet by mouth daily.  . budesonide-formoterol (SYMBICORT) 160-4.5 MCG/ACT inhaler Inhale 2 puffs into the lungs 2 (two) times daily.  . Calcium Carb-Cholecalciferol 600-800 MG-UNIT TABS Take 1 tablet by mouth daily.  . carbidopa-levodopa (SINEMET IR) 25-100 MG tablet TAKE 1 TABLET BY MOUTH THREE TIMES DAILY  . famotidine (PEPCID) 20 MG tablet TAKE 1 TABLET TWICE DAILY  . fluticasone (FLONASE SENSIMIST) 27.5 MCG/SPRAY nasal spray Place 2 sprays into the nose daily.  . furosemide (LASIX) 40 MG tablet TAKE 1/2 TABLET EVERY DAY AS NEEDED FOR  FLUID/SWELLING  . gabapentin (NEURONTIN) 100 MG capsule Take 300mg  (3 pills) at night before bed and 100mg  (1 pill) in the morning with breakfast.  . levalbuterol (XOPENEX) 0.63 MG/3ML nebulizer solution 1 vial every 4-6 hours as needed for wheezing and shortness of breath.  . losartan (COZAAR) 25 MG tablet TAKE 1 TABLET EVERY DAY  . meclizine (ANTIVERT) 12.5 MG tablet Take 0.5 tablets (6.25 mg total) by mouth 3 (three) times daily as needed for dizziness.  . metFORMIN (GLUCOPHAGE) 500 MG tablet Take 2 tablets (1,000 mg total) by mouth 2 (two) times daily with a meal.  . methocarbamol (ROBAXIN) 750 MG tablet Take 750 mg by mouth as needed.   . metoprolol succinate (TOPROL-XL) 50 MG 24 hr tablet TAKE 1 TABLET TWICE DAILY  . potassium chloride (K-DUR,KLOR-CON) 10 MEQ tablet Take 1 tablet (10 mEq total) by mouth as needed (take with lasix as needed for swelling).  [name] Kitchen spironolactone (ALDACTONE) 25 MG tablet TAKE 1/2 TABLET EVERY DAY  . Turmeric 500 MG TABS Take 1 tablet by mouth daily.     Allergies:   Aspirin, Penicillins, Prempro [conj estrog-medroxyprogest ace], Ace inhibitors, and Simvastatin   Social History   Tobacco Use  . Smoking  status: Never Smoker  . Smokeless tobacco: Never Used  . Tobacco comment: exposed to smoke during child hood (parents)  Substance Use Topics  . Alcohol use: No    Alcohol/week: 0.0 standard drinks  . Drug use: No     Family Hx: The patient's family history includes Coronary artery disease in her father and mother; Diabetes in her father, mother, sister, sister, and sister; Diabetes (age of onset: 33) in her son; Healthy in her daughter; Heart attack in her father and mother; Heart disease in her sister, sister, and sister; Hepatitis C in her sister; Parkinson's disease in her sister. There is no history of Breast cancer.  ROS   EKGs/Labs/Other Test Reviewed:    EKG:  EKG is  ordered today.  The ekg ordered today demonstrates normal sinus rhythm, heart rate 81, left bundle branch block, no change from prior tracings  Recent Labs: 01/30/2019: ALT 6; Hemoglobin 13.3; Platelets 247 03/05/2019: BUN 24; Creatinine, Ser 1.31; Potassium 4.9; Sodium 144   Recent Lipid Panel Lab Results  Component Value Date/Time   CHOL 182 03/05/2019 09:11 AM   TRIG 144 03/05/2019 09:11 AM   HDL 43 03/05/2019 09:11 AM   CHOLHDL 4.2 03/05/2019 09:11 AM   CHOLHDL 3.5 05/31/2013 04:35 AM   LDLCALC 110 (H) 03/05/2019 09:11 AM   LDLDIRECT 111 (H) 02/10/2015 11:45 AM    Physical Exam:    VS:  BP 126/68   Pulse 72   Ht 5' 1.5" (1.562 m)   Wt 139 lb (63 kg)   LMP 08/01/2000 (Approximate)   SpO2 97%   BMI 25.84 kg/m     Wt Readings from Last 3 Encounters:  11/01/19 139 lb (63 kg)  10/03/19 140 lb (63.5 kg)  09/18/19 140 lb 12.8 oz (63.9 kg)     Constitutional:      Appearance: Healthy appearance. Not in distress.  Pulmonary:     Breath sounds: No wheezing. No rales.  Cardiovascular:     Normal rate. Regular rhythm.  Edema:    Peripheral edema absent.  Abdominal:     Palpations: Abdomen is soft.     Tenderness: There is abdominal tenderness in the epigastric area.  Musculoskeletal:      Cervical back: Neck supple. Skin:    General: Skin is warm and dry.  Neurological:     General: No focal deficit present.     Mental Status: Alert and oriented to person, place and time.      ASSESSMENT & PLAN:    1. Chronic systolic heart failure (HCC) Nonischemic cardiomyopathy.  Ejection fraction has improved to normal by most recent echocardiogram in March 2019.  NYHA II.  She is mainly limited by asthma and Parkinson's.  Volume status is stable.  Continue current medical regimen which includes metoprolol succinate, losartan and spironolactone.  Obtain follow-up BMET, CBC today.  2. Essential hypertension The patient's blood pressure is controlled on her current regimen.  Continue current therapy.   3. Stage 3a chronic kidney disease Most recent creatinine stable.  As she continues on losartan and spironolactone, I will obtain a follow-up BMET.  4. Black stools She notes recent symptoms of abdominal discomfort and black tarry stools.  This has essentially resolved and her stools are back to normal.  She has an appointment with gastroenterology next week.  Obtain BMET, CBC today.    Dispo:  Return in about 1 year (around 10/31/2020) for Routine Follow Up, w/ Dr. Burt Knack, or Richardson Dopp, PA-C, in person.   Medication Adjustments/Labs and Tests Ordered: Current medicines are reviewed at length with the patient today.  Concerns regarding medicines are outlined above.  Tests Ordered: Orders Placed This Encounter  Procedures  . Basic metabolic panel  . CBC  . EKG 12-Lead   Medication Changes: No orders of the defined types were placed in this encounter.   Signed, Richardson Dopp, PA-C  11/01/2019 8:38 AM    [facility] [address], [place], [place]  [address] Phone: [phone]; Fax: [phone]

## 2019-11-01 ENCOUNTER — Encounter: Payer: Self-pay | Admitting: Physician Assistant

## 2019-11-01 ENCOUNTER — Ambulatory Visit (INDEPENDENT_AMBULATORY_CARE_PROVIDER_SITE_OTHER): Payer: Medicare Other | Admitting: Physician Assistant

## 2019-11-01 ENCOUNTER — Other Ambulatory Visit: Payer: Self-pay

## 2019-11-01 VITALS — BP 126/68 | HR 72 | Ht 61.5 in | Wt 139.0 lb

## 2019-11-01 DIAGNOSIS — K921 Melena: Secondary | ICD-10-CM

## 2019-11-01 DIAGNOSIS — I5022 Chronic systolic (congestive) heart failure: Secondary | ICD-10-CM

## 2019-11-01 DIAGNOSIS — N1831 Chronic kidney disease, stage 3a: Secondary | ICD-10-CM | POA: Diagnosis not present

## 2019-11-01 DIAGNOSIS — I10 Essential (primary) hypertension: Secondary | ICD-10-CM

## 2019-11-01 LAB — CBC
Hematocrit: 38.9 % (ref 34.0–46.6)
Hemoglobin: 13.2 g/dL (ref 11.1–15.9)
MCH: 29.9 pg (ref 26.6–33.0)
MCHC: 33.9 g/dL (ref 31.5–35.7)
MCV: 88 fL (ref 79–97)
Platelets: 193 10*3/uL (ref 150–450)
RBC: 4.42 x10E6/uL (ref 3.77–5.28)
RDW: 12.3 % (ref 11.7–15.4)
WBC: 6.3 10*3/uL (ref 3.4–10.8)

## 2019-11-01 LAB — BASIC METABOLIC PANEL
BUN/Creatinine Ratio: 17 (ref 12–28)
BUN: 19 mg/dL (ref 8–27)
CO2: 24 mmol/L (ref 20–29)
Calcium: 10 mg/dL (ref 8.7–10.3)
Chloride: 104 mmol/L (ref 96–106)
Creatinine, Ser: 1.13 mg/dL — ABNORMAL HIGH (ref 0.57–1.00)
GFR calc Af Amer: 57 mL/min/{1.73_m2} — ABNORMAL LOW (ref >59)
GFR calc non Af Amer: 49 mL/min/{1.73_m2} — ABNORMAL LOW (ref >59)
Glucose: 154 mg/dL — ABNORMAL HIGH (ref 65–99)
Potassium: 4.2 mmol/L (ref 3.5–5.2)
Sodium: 143 mmol/L (ref 134–144)

## 2019-11-01 NOTE — Patient Instructions (Signed)
Medication Instructions:   Your physician recommends that you continue on your current medications as directed. Please refer to the Current Medication list given to you today.  *If you need a refill on your cardiac medications before your next appointment, please call your pharmacy*  Lab Work:  You will have labs drawn today: BMET/CBC   If you have labs (blood work) drawn today and your tests are completely normal, you will receive your results only by: [name] Kitchen MyChart Message (if you have MyChart) OR . A paper copy in the mail If you have any lab test that is abnormal or we need to change your treatment, we will call you to review the results.  Testing/Procedures:  None ordered today  Follow-Up: At [facility], you and your health needs are our priority.  As part of our continuing mission to provide you with exceptional heart care, we have created designated Provider Care Teams.  These Care Teams include your primary Cardiologist (physician) and Advanced Practice Providers (APPs -  Physician Assistants and Nurse Practitioners) who all work together to provide you with the care you need, when you need it.  We recommend signing up for the patient portal called "MyChart".  Sign up information is provided on this After Visit Summary.  MyChart is used to connect with patients for Virtual Visits (Telemedicine).  Patients are able to view lab/test results, encounter notes, upcoming appointments, etc.  Non-urgent messages can be sent to your provider as well.   To learn more about what you can do with MyChart, go to [email or web address].    Your next appointment:   12 month(s)  The format for your next appointment:   In Person  Provider:   You may see Sherren Mocha, MD or Richardson Dopp, PA-C

## 2019-11-06 ENCOUNTER — Other Ambulatory Visit: Payer: Self-pay | Admitting: Physician Assistant

## 2019-11-06 DIAGNOSIS — R1013 Epigastric pain: Secondary | ICD-10-CM | POA: Diagnosis not present

## 2019-11-06 DIAGNOSIS — K219 Gastro-esophageal reflux disease without esophagitis: Secondary | ICD-10-CM

## 2019-11-06 DIAGNOSIS — K921 Melena: Secondary | ICD-10-CM | POA: Diagnosis not present

## 2019-11-13 DIAGNOSIS — N183 Chronic kidney disease, stage 3 unspecified: Secondary | ICD-10-CM | POA: Diagnosis not present

## 2019-11-14 ENCOUNTER — Ambulatory Visit: Payer: Medicare Other | Admitting: Pharmacist

## 2019-11-15 ENCOUNTER — Other Ambulatory Visit: Payer: Self-pay | Admitting: Pharmacy Technician

## 2019-11-15 NOTE — Patient Outreach (Signed)
[facility]) Care Management  11/15/2019  NICOSHA BRIDWELL 03-26-1951 [id number]   Unsuccessful call placed to patient regarding patient assistance application(s) for Symbicort with [employer] , HIPAA compliant voicemail left.   Was calling to inquire if patient has received the application that was mailed to her on 10/29/2019.  Follow up:  Will follow up with patient in 3-5 business days if call is not returned.  Edessa Jakubowicz P. Ezel Vallone, [place]  [phone]

## 2019-11-19 ENCOUNTER — Ambulatory Visit
Admission: RE | Admit: 2019-11-19 | Discharge: 2019-11-19 | Disposition: A | Discharge status: Home / Self Care | Payer: Medicare Other | Source: Ambulatory Visit | Attending: Physician Assistant | Admitting: Physician Assistant

## 2019-11-19 DIAGNOSIS — R1013 Epigastric pain: Secondary | ICD-10-CM | POA: Diagnosis not present

## 2019-11-19 DIAGNOSIS — K219 Gastro-esophageal reflux disease without esophagitis: Secondary | ICD-10-CM

## 2019-11-20 DIAGNOSIS — E559 Vitamin D deficiency, unspecified: Secondary | ICD-10-CM | POA: Diagnosis not present

## 2019-11-20 DIAGNOSIS — I129 Hypertensive chronic kidney disease with stage 1 through stage 4 chronic kidney disease, or unspecified chronic kidney disease: Secondary | ICD-10-CM | POA: Diagnosis not present

## 2019-11-20 DIAGNOSIS — N1831 Chronic kidney disease, stage 3a: Secondary | ICD-10-CM | POA: Diagnosis not present

## 2019-11-20 DIAGNOSIS — I5022 Chronic systolic (congestive) heart failure: Secondary | ICD-10-CM | POA: Diagnosis not present

## 2019-11-21 ENCOUNTER — Other Ambulatory Visit: Payer: Self-pay | Admitting: Pharmacy Technician

## 2019-11-21 DIAGNOSIS — K921 Melena: Secondary | ICD-10-CM | POA: Diagnosis not present

## 2019-11-21 NOTE — Patient Outreach (Signed)
[place] [facility]) Care Management  11/21/2019  Veronica Shaw [birth year] [id number]   Received both patient and providdr portion(s) of patient assistance application(s) for Symbicort. Faxed completed application and required documents into [employer].  Will follow up with company(ies) in 3-10 business days to check status of application(s).  Veronica Shaw P. Jacarra Bobak, [place]  [phone]

## 2019-11-27 ENCOUNTER — Other Ambulatory Visit: Payer: Self-pay | Admitting: Pharmacy Technician

## 2019-11-27 NOTE — Patient Outreach (Signed)
[facility] [facility]) Care Management  11/27/2019  Veronica Shaw [birth year] [id number]   Care coordination call placed to [employer] in regards to patient's Symbicort application.  Spoke to [name] who informed patient has been APPROVED 11/27/2019-07/31/2020. She informed medication is expected to ship out today with an arrival date to patient's home in 5-10 business days.  Will follow up with patient in 7-10 business days to confirm medication was received and to discuss refill procedure.  Navneet Schmuck P. [place], [place]  [phone]

## 2019-11-28 ENCOUNTER — Ambulatory Visit (INDEPENDENT_AMBULATORY_CARE_PROVIDER_SITE_OTHER): Payer: Medicare Other | Admitting: *Deleted

## 2019-11-28 DIAGNOSIS — I5022 Chronic systolic (congestive) heart failure: Secondary | ICD-10-CM

## 2019-11-28 LAB — CUP PACEART REMOTE DEVICE CHECK
Battery Remaining Longevity: 37 mo
Battery Voltage: 2.97 V
Brady Statistic AP VP Percent: 0 %
Brady Statistic AP VS Percent: 0.01 %
Brady Statistic AS VP Percent: 0.03 %
Brady Statistic AS VS Percent: 99.96 %
Brady Statistic RA Percent Paced: 0.01 %
Brady Statistic RV Percent Paced: 0.03 %
Date Time Interrogation Session: 20210429022603
HighPow Impedance: 77 Ohm
Implantable Lead Implant Date: 20150427
Implantable Lead Implant Date: 20150427
Implantable Lead Location: 753859
Implantable Lead Location: 753860
Implantable Lead Model: 5076
Implantable Lead Model: 6935
Implantable Pulse Generator Implant Date: 20150427
Lead Channel Impedance Value: 399 Ohm
Lead Channel Impedance Value: 4047 Ohm
Lead Channel Impedance Value: 4047 Ohm
Lead Channel Impedance Value: 4047 Ohm
Lead Channel Impedance Value: 551 Ohm
Lead Channel Impedance Value: 646 Ohm
Lead Channel Pacing Threshold Amplitude: 0.5 V
Lead Channel Pacing Threshold Amplitude: 0.5 V
Lead Channel Pacing Threshold Pulse Width: 0.4 ms
Lead Channel Pacing Threshold Pulse Width: 0.4 ms
Lead Channel Sensing Intrinsic Amplitude: 14.25 mV
Lead Channel Sensing Intrinsic Amplitude: 14.25 mV
Lead Channel Sensing Intrinsic Amplitude: 2.25 mV
Lead Channel Sensing Intrinsic Amplitude: 2.25 mV
Lead Channel Setting Pacing Amplitude: 2 V
Lead Channel Setting Pacing Amplitude: 2.5 V
Lead Channel Setting Pacing Pulse Width: 0.4 ms
Lead Channel Setting Sensing Sensitivity: 0.3 mV

## 2019-11-29 ENCOUNTER — Ambulatory Visit (INDEPENDENT_AMBULATORY_CARE_PROVIDER_SITE_OTHER): Payer: Medicare Other

## 2019-11-29 DIAGNOSIS — Z95810 Presence of automatic (implantable) cardiac defibrillator: Secondary | ICD-10-CM | POA: Diagnosis not present

## 2019-11-29 DIAGNOSIS — I5022 Chronic systolic (congestive) heart failure: Secondary | ICD-10-CM | POA: Diagnosis not present

## 2019-11-29 NOTE — Progress Notes (Signed)
ICD Remote  

## 2019-12-02 NOTE — Progress Notes (Signed)
EPIC Encounter for ICM Monitoring  Patient Name: Veronica Shaw is a 69 y.o. female Date: 12/02/2019 Primary Care Physican: Caroline More, DO Primary Cardiologist:Cooper/Weaver PA Electrophysiologist: Lovena Le 3/26/2021Weight: 138lbs  Spoke with patient and reports feeling well at this time.  Denies fluid symptoms.    OptiVolThoracic impedancenormal.  Prescribed:Furosemide40 mgTake 0.5 tablets (20 mg total) by mouthevery dayas needed for fluid (For swelling).   Labs: 03/05/2019 Creatinine 1.31, BUN 24, Potassium 4.9, Sodium 144, GFR 41-48 01/30/2019 Creatinine1.44, BUN22, Potassium4.5, Sodium139, [id number] A complete set of results can be found in Results Review.  Recommendations: No changes and encouraged to call if experiencing any fluid symptoms.  Follow-up plan: ICM clinic phone appointment on6/08/2019. 91 day device clinic remote transmission7/29/2021.   Copy of ICM check sent to Dr.Taylor.   3 month ICM trend: 11/28/2019    1 Year ICM trend:       [name], RN 12/02/2019 5:10 PM

## 2019-12-09 ENCOUNTER — Other Ambulatory Visit: Payer: Self-pay | Admitting: Pharmacy Technician

## 2019-12-09 NOTE — Patient Outreach (Signed)
[facility]) Care Management  12/09/2019  Veronica Shaw 04-13-1951 [id number]   Successful call placed to patient regarding patient assistance medication delivery of Symbicort from [employer], HIPAA identifiers verified   Patient informed she received 3 inhalers of Symbicort. Informed her to finish up her supply of Dulera before starting the Symbicort and to not use them together. Informed patient when she opens the last box of Symbicort then phone in her refill to [employer] by calling the number on the pharmacy label of the inhaler box. Patient verbalized understanding. Patient informed she had no other questions or concerns. Confirmed patient had name and number.  Follow up:  Will remove myself from care team as patient assistance is completed.  Giacomo Valone P. Laird Runnion, [place]  [phone]

## 2019-12-11 ENCOUNTER — Ambulatory Visit
Admission: RE | Admit: 2019-12-11 | Discharge: 2019-12-11 | Disposition: A | Discharge status: Home / Self Care | Payer: Medicare Other | Source: Ambulatory Visit | Attending: *Deleted | Admitting: *Deleted

## 2019-12-11 ENCOUNTER — Other Ambulatory Visit: Payer: Self-pay

## 2019-12-11 DIAGNOSIS — Z1231 Encounter for screening mammogram for malignant neoplasm of breast: Secondary | ICD-10-CM | POA: Diagnosis not present

## 2019-12-17 ENCOUNTER — Other Ambulatory Visit: Payer: Self-pay | Admitting: *Deleted

## 2019-12-17 NOTE — Patient Outreach (Signed)
[facility]) Care Management  12/17/2019  Veronica Shaw [birth year] [id number]   RN Health Coach telephone call to patient.  Patient verified name. Patient s going out with grand daughter and will call back this evening.  [place] [facility] [phone]

## 2019-12-18 ENCOUNTER — Encounter: Payer: Self-pay | Admitting: Adult Health

## 2019-12-18 ENCOUNTER — Other Ambulatory Visit: Payer: Self-pay | Admitting: *Deleted

## 2019-12-18 ENCOUNTER — Other Ambulatory Visit: Payer: Self-pay

## 2019-12-18 ENCOUNTER — Ambulatory Visit (INDEPENDENT_AMBULATORY_CARE_PROVIDER_SITE_OTHER): Payer: Medicare Other | Admitting: Adult Health

## 2019-12-18 DIAGNOSIS — J4530 Mild persistent asthma, uncomplicated: Secondary | ICD-10-CM | POA: Diagnosis not present

## 2019-12-18 DIAGNOSIS — J9611 Chronic respiratory failure with hypoxia: Secondary | ICD-10-CM | POA: Insufficient documentation

## 2019-12-18 DIAGNOSIS — J310 Chronic rhinitis: Secondary | ICD-10-CM | POA: Diagnosis not present

## 2019-12-18 NOTE — Assessment & Plan Note (Signed)
Doing well on Symbicort  Plan  Patient Instructions  Continue on Oxygen 2l/m At bedtime  .  Saline nasal spray Twice daily  As needed   Saline nasal Gel At bedtime  .  Flonase daily .  Continue on Symbicort 2 puffs Twice daily  , rinse after use.  Use Xopenex As needed for wheezing and shortness of breath  Activity as tolerated.  Follow up in 3 -4 Months with Dr. Vaughan Browner    and As needed   Please contact office for sooner follow up if symptoms do not improve or worsen or seek emergency care

## 2019-12-18 NOTE — Patient Instructions (Addendum)
Continue on Oxygen 2l/m At bedtime  .  Saline nasal spray Twice daily  As needed   Saline nasal Gel At bedtime  .  Flonase daily .  Continue on Symbicort 2 puffs Twice daily  , rinse after use.  Use Xopenex As needed for wheezing and shortness of breath  Activity as tolerated.  Follow up in 3 -4 Months with Dr. Vaughan Browner    and As needed   Please contact office for sooner follow up if symptoms do not improve or worsen or seek emergency care

## 2019-12-18 NOTE — Assessment & Plan Note (Signed)
Continue on Flonase.  Add in saline nasal spray and saline nasal gel  Plan Patient Instructions  Continue on Oxygen 2l/m At bedtime  .  Saline nasal spray Twice daily  As needed   Saline nasal Gel At bedtime  .  Flonase daily .  Continue on Symbicort 2 puffs Twice daily  , rinse after use.  Use Xopenex As needed for wheezing and shortness of breath  Activity as tolerated.  Follow up in 3 -4 Months with Dr. Vaughan Browner    and As needed   Please contact office for sooner follow up if symptoms do not improve or worsen or seek emergency care

## 2019-12-18 NOTE — Assessment & Plan Note (Signed)
Continue on nocturnal oxygen at 2 L  Plan Patient Instructions  Continue on Oxygen 2l/m At bedtime  .  Saline nasal spray Twice daily  As needed   Saline nasal Gel At bedtime  .  Flonase daily .  Continue on Symbicort 2 puffs Twice daily  , rinse after use.  Use Xopenex As needed for wheezing and shortness of breath  Activity as tolerated.  Follow up in 3 -4 Months with Dr. Vaughan Browner    and As needed   Please contact office for sooner follow up if symptoms do not improve or worsen or seek emergency care

## 2019-12-18 NOTE — Progress Notes (Signed)
@Patient  ID: Veronica Shaw, female    DOB: Oct 05, 1950, 69 y.o.   MRN: [id number]  Chief Complaint  Patient presents with  . Follow-up    Asthma     Referring provider: Caroline More, DO  HPI: 69 year old female never smoker followed for chronic asthma and upper airway cough syndrome. Medical history significant for congestive heart failure, osteoarthritis possible rheumatoid arthritis, Parkinson's disease, chronic kidney disease stage III, diabetes, GERD and depression.  TEST/EVENTS :  2021 Sleep study showed no significant sleep apnea.  Her AHI was 2.9/hour.  She did have some mild desaturations with an SaO2 low at 86%.  2021 ONO + desats   12/18/2019 Follow up : Asthma , O2 RF -nocturnal O2.  Patient returns for a follow-up visit.  Patient has underlying chronic asthma.  Recently changed from [facility] to Symbicort due to insurance issues . [name] Kitchen  Patient says overall breathing has been doing well with no increased cough or wheezing.  Denies any increased albuterol use.  Patient has chronic rhinitis.  Is on Flonase daily.  Does get some post nasal drainage especially when she wakes up in am.   As above recent sleep study showed no significant sleep apnea but had some nocturnal desaturations.  Subsequent overnight oximetry test showed nocturnal desaturations.  She was started on nocturnal oxygen.  Patient says she is doing well on oxygen 2 L at bedtime.  Says that she feels that he is making a good difference. No longer wakes up with headache.   Has received both of her Covid vaccines.  Allergies  Allergen Reactions  . Aspirin Swelling and Other (See Comments)    Other reaction(s): Other (See Comments) Causes nose bleeds *ONLY THE COATED ASA* Causes nose bleeds *ONLY THE COATED ASA*  . Penicillins Shortness Of Breath and Rash    Shortness of Breath - Throat felt like it was closing.   [name] [Conj Estrog-Medroxyprogest Ace] Shortness Of Breath    Throat swelling Throat  swelling  . Ace Inhibitors Cough  . Simvastatin Other (See Comments) and Rash    Muscle aches    Immunization History  Administered Date(s) Administered  . Influenza Split 06/17/2011, 04/18/2012  . Influenza Whole 05/17/2007, 06/14/2010  . Influenza,inj,Quad PF,6+ Mos 05/20/2014, 06/04/2015, 03/22/2016, 05/30/2017, 04/12/2018  . Influenza,inj,Quad PF,6-35 Mos 05/03/2013  . Influenza-Unspecified 04/01/2019  . PFIZER SARS-COV-2 Vaccination 09/05/2019, 10/01/2019  . Pneumococcal Conjugate-13 05/20/2014  . Pneumococcal Polysaccharide-23 05/17/2007, 06/01/2013, 03/05/2019  . Td 03/01/2006  . Zoster 08/01/2009    Past Medical History:  Diagnosis Date  . Asthma   . Chronic systolic CHF (congestive heart failure) ([name])    a. cMRI 4/15: EF 34% and findings - c/w NICM, normal RV size and function (RVEF 61%), Mild MR // b. Echo 2/15:  EF 30-35%, diff HK, ant-sept AK, Gr 2 DD, mild MR, trivial TR  //  c. Echo 5/17: EF 20-25%, severe diffuse HK, marked systolic dyssynchrony, grade 1 diastolic dysfunction, mild MR  //  d. RHC 5/17: Fick CO 2.9, RVSP 19, PASP 15, PW mean 2, low filing pressures and preserved CO   . Diabetes mellitus   . Flu 10/17/2017  . Gastritis   . History of echocardiogram    Echo 6/18: EF 30-35, diffuse HK, grade 1 diastolic dysfunction, trivial MR, mild LAE, mild TR, no pericardial effusion  . History of nuclear stress test    Myoview 5/18: EF 49, no ischemia, inferoseptal defect c/w LBBB artifact (intermediate risk due to EF <  33).  . HTN (hypertension)   . Hyperlipidemia   . NICM (nonischemic cardiomyopathy) ([place])    a. Nuclear 5/13: Normal stress nuclear study. LV Ejection Fraction: 58%  //  b. LHC 10/14: Minor luminal irregularity in prox LAD, EF 35%   . Parkinson disease ([place])   . Plantar fasciitis   . Sleep apnea    was retested and no longer had it and so d/c CPAP  . Urticaria     Tobacco History: Social History   Tobacco Use  Smoking Status Never Smoker   Smokeless Tobacco Never Used  Tobacco Comment   exposed to smoke during child hood (parents)   Counseling given: Not Answered Comment: exposed to smoke during child hood (parents)   Outpatient Medications Prior to Visit  Medication Sig Dispense Refill  . albuterol (PROVENTIL HFA;VENTOLIN HFA) 108 (90 Base) MCG/ACT inhaler Inhale 2 puffs into the lungs every 4 (four) hours as needed for wheezing or shortness of breath. 18 g 3  . Biotin 5000 MCG CAPS Take 1 tablet by mouth daily.    . budesonide-formoterol (SYMBICORT) 160-4.5 MCG/ACT inhaler Inhale 2 puffs into the lungs 2 (two) times daily. 1 Inhaler 0  . Calcium Carb-Cholecalciferol 600-800 MG-UNIT TABS Take 1 tablet by mouth daily. 90 tablet 3  . carbidopa-levodopa (SINEMET IR) 25-100 MG tablet TAKE 1 TABLET BY MOUTH THREE TIMES DAILY 270 tablet 0  . famotidine (PEPCID) 20 MG tablet TAKE 1 TABLET TWICE DAILY 180 tablet 2  . fluticasone (FLONASE SENSIMIST) 27.5 MCG/SPRAY nasal spray Place 2 sprays into the nose daily. 10 g 1  . furosemide (LASIX) 40 MG tablet TAKE 1/2 TABLET EVERY DAY AS NEEDED FOR  FLUID/SWELLING 30 tablet 3  . gabapentin (NEURONTIN) 100 MG capsule Take 300mg  (3 pills) at night before bed and 100mg  (1 pill) in the morning with breakfast. 120 capsule 1  . levalbuterol (XOPENEX) 0.63 MG/3ML nebulizer solution 1 vial every 4-6 hours as needed for wheezing and shortness of breath. 180 mL 5  . losartan (COZAAR) 25 MG tablet TAKE 1 TABLET EVERY DAY 90 tablet 3  . meclizine (ANTIVERT) 12.5 MG tablet Take 0.5 tablets (6.25 mg total) by mouth 3 (three) times daily as needed for dizziness. 30 tablet 0  . metFORMIN (GLUCOPHAGE) 500 MG tablet Take 2 tablets (1,000 mg total) by mouth 2 (two) times daily with a meal. 180 tablet 1  . methocarbamol (ROBAXIN) 750 MG tablet Take 750 mg by mouth as needed.     . metoprolol succinate (TOPROL-XL) 50 MG 24 hr tablet TAKE 1 TABLET TWICE DAILY 180 tablet 3  . potassium chloride (K-DUR,KLOR-CON)  10 MEQ tablet Take 1 tablet (10 mEq total) by mouth as needed (take with lasix as needed for swelling). 30 tablet 2  . spironolactone (ALDACTONE) 25 MG tablet TAKE 1/2 TABLET EVERY DAY 45 tablet 3  . Turmeric 500 MG TABS Take 1 tablet by mouth daily.     No facility-administered medications prior to visit.     Review of Systems:   Constitutional:   No  weight loss, night sweats,  Fevers, chills, fatigue, or  lassitude.  HEENT:   No headaches,  Difficulty swallowing,  Tooth/dental problems, or  Sore throat,                No sneezing, itching, ear ache, + nasal congestion, post nasal drip,   CV:  No chest pain,  Orthopnea, PND, swelling in lower extremities, anasarca, dizziness, palpitations, syncope.   GI  No heartburn, indigestion, abdominal pain, nausea, vomiting, diarrhea, change in bowel habits, loss of appetite, bloody stools.   Resp: No shortness of breath with exertion or at rest.  No excess mucus, no productive cough,  No non-productive cough,  No coughing up of blood.  No change in color of mucus.  No wheezing.  No chest wall deformity  Skin: no rash or lesions.  GU: no dysuria, change in color of urine, no urgency or frequency.  No flank pain, no hematuria   MS:  No joint pain or swelling.  No decreased range of motion.  No back pain.    Physical Exam  BP 124/86 (BP Location: Left Arm, Cuff Size: Normal)   Pulse 86   Ht 5' 1.5" (1.562 m)   Wt 139 lb (63 kg)   LMP 08/01/2000 (Approximate)   SpO2 93%   BMI 25.84 kg/m   GEN: A/Ox3; pleasant , NAD, well nourished    HEENT:  [place]/AT,   NOSE-clear, THROAT-clear, no lesions, no postnasal drip or exudate noted.   NECK:  Supple w/ fair ROM; no JVD; normal carotid impulses w/o bruits; no thyromegaly or nodules palpated; no lymphadenopathy.    RESP  Clear  P & A; w/o, wheezes/ rales/ or rhonchi. no accessory muscle use, no dullness to percussion  CARD:  RRR, no m/r/g, no peripheral edema, pulses intact, no cyanosis or  clubbing.  GI:   Soft & nt; nml bowel sounds; no organomegaly or masses detected.   Musco: Warm bil, no deformities or joint swelling noted.   Neuro: alert, no focal deficits noted.    Skin: Warm, no lesions or rashes    Lab Results:      PFT Results Latest Ref Rng & Units 02/17/2015  FVC-Pre L 2.23  FVC-Predicted Pre % 80  FVC-Post L 2.49  FVC-Predicted Post % 89  Pre FEV1/FVC % % 75  Post FEV1/FCV % % 79  FEV1-Pre L 1.67  FEV1-Predicted Pre % 79  FEV1-Post L 1.97  DLCO UNC% % 107  DLCO COR %Predicted % 117  TLC L 4.32  TLC % Predicted % 93  RV % Predicted % 70    Lab Results  Component Value Date   NITRICOXIDE 82 08/27/2018        Assessment & Plan:   Mild persistent asthma in adult without complication Doing well on Symbicort  Plan  Patient Instructions  Continue on Oxygen 2l/m At bedtime  .  Saline nasal spray Twice daily  As needed   Saline nasal Gel At bedtime  .  Flonase daily .  Continue on Symbicort 2 puffs Twice daily  , rinse after use.  Use Xopenex As needed for wheezing and shortness of breath  Activity as tolerated.  Follow up in 3 -4 Months with Dr. Vaughan Browner    and As needed   Please contact office for sooner follow up if symptoms do not improve or worsen or seek emergency care        Chronic rhinitis Continue on Flonase.  Add in saline nasal spray and saline nasal gel  Plan Patient Instructions  Continue on Oxygen 2l/m At bedtime  .  Saline nasal spray Twice daily  As needed   Saline nasal Gel At bedtime  .  Flonase daily .  Continue on Symbicort 2 puffs Twice daily  , rinse after use.  Use Xopenex As needed for wheezing and shortness of breath  Activity as tolerated.  Follow up in 3 -4 Months  with Dr. Vaughan Browner    and As needed   Please contact office for sooner follow up if symptoms do not improve or worsen or seek emergency care        Chronic respiratory failure with hypoxia ([place]) Continue on nocturnal oxygen at 2 L   Plan Patient Instructions  Continue on Oxygen 2l/m At bedtime  .  Saline nasal spray Twice daily  As needed   Saline nasal Gel At bedtime  .  Flonase daily .  Continue on Symbicort 2 puffs Twice daily  , rinse after use.  Use Xopenex As needed for wheezing and shortness of breath  Activity as tolerated.  Follow up in 3 -4 Months with Dr. Vaughan Browner    and As needed   Please contact office for sooner follow up if symptoms do not improve or worsen or seek emergency care           Rexene Edison, NP 12/18/2019

## 2019-12-27 NOTE — Patient Outreach (Signed)
[facility]) Care Management  [facility] Care Manager  [id number] Late entry  Veronica Shaw 09/07/1950 [id number]  [place] telephone call to patient.  [place] compliance verified. Per patient she is weighing and monitoring her sodium intake. Patient is taking medications as prescribed. Patient is able to verbalize the CHF zones and action plan. Per patient she is on Oxygen 2 liters. Per patient she is having neck and back pain.  Patient has agreed to follow up outreach calls.   Encounter Medications:  Outpatient Encounter Medications as of 12/18/2019  Medication Sig  . albuterol (PROVENTIL HFA;VENTOLIN HFA) 108 (90 Base) MCG/ACT inhaler Inhale 2 puffs into the lungs every 4 (four) hours as needed for wheezing or shortness of breath.  . Biotin 5000 MCG CAPS Take 1 tablet by mouth daily.  . budesonide-formoterol (SYMBICORT) 160-4.5 MCG/ACT inhaler Inhale 2 puffs into the lungs 2 (two) times daily.  . Calcium Carb-Cholecalciferol 600-800 MG-UNIT TABS Take 1 tablet by mouth daily.  . carbidopa-levodopa (SINEMET IR) 25-100 MG tablet TAKE 1 TABLET BY MOUTH THREE TIMES DAILY  . famotidine (PEPCID) 20 MG tablet TAKE 1 TABLET TWICE DAILY  . fluticasone (FLONASE SENSIMIST) 27.5 MCG/SPRAY nasal spray Place 2 sprays into the nose daily.  . furosemide (LASIX) 40 MG tablet TAKE 1/2 TABLET EVERY DAY AS NEEDED FOR  FLUID/SWELLING  . gabapentin (NEURONTIN) 100 MG capsule Take 300mg  (3 pills) at night before bed and 100mg  (1 pill) in the morning with breakfast.  . levalbuterol (XOPENEX) 0.63 MG/3ML nebulizer solution 1 vial every 4-6 hours as needed for wheezing and shortness of breath.  . losartan (COZAAR) 25 MG tablet TAKE 1 TABLET EVERY DAY  . meclizine (ANTIVERT) 12.5 MG tablet Take 0.5 tablets (6.25 mg total) by mouth 3 (three) times daily as needed for dizziness.  . metFORMIN (GLUCOPHAGE) 500 MG tablet Take 2 tablets (1,000 mg total) by mouth 2 (two) times daily with a meal.  .  methocarbamol (ROBAXIN) 750 MG tablet Take 750 mg by mouth as needed.   . metoprolol succinate (TOPROL-XL) 50 MG 24 hr tablet TAKE 1 TABLET TWICE DAILY  . potassium chloride (K-DUR,KLOR-CON) 10 MEQ tablet Take 1 tablet (10 mEq total) by mouth as needed (take with lasix as needed for swelling).  [name] Kitchen spironolactone (ALDACTONE) 25 MG tablet TAKE 1/2 TABLET EVERY DAY  . Turmeric 500 MG TABS Take 1 tablet by mouth daily.   No facility-administered encounter medications on file as of 12/18/2019.    Functional Status:  In your present state of health, do you have any difficulty performing the following activities: 07/09/2019 04/02/2019  Hearing? N N  Vision? Y Y  Comment - Patient stated he eyelids are droopy and eyes always puffy  Difficulty concentrating or making decisions? Y (No Data)  Comment - per patient she is forgetful  Walking or climbing stairs? Y Y  Comment - pt has shortness of breath of on exertion  Dressing or bathing? N N  Doing errands, shopping? Y Y  Comment - family does most of the shopping. Patient is doing a little driving now  Conservation officer, nature and eating ? N N  Using the Toilet? N N  In the past six months, have you accidently leaked urine? Y Y  Do you have problems with loss of bowel control? N N  Managing your Medications? N N  Managing your Finances? N N  Housekeeping or managing your Housekeeping? N N  Some recent data might be hidden  Fall/Depression Screening: Fall Risk  09/18/2019 09/17/2019 07/09/2019  Falls in the past year? 1 1 1   Comment - - -  Number falls in past yr: 0 1 1  Injury with Fall? 0 0 0  Comment - - -  Risk Factor Category  Low Risk (0 Points) Moderate Risk (1 Point) Moderate Risk (1 Point)  Risk for fall due to : - History of fall(s);Impaired balance/gait;Impaired mobility History of fall(s);Impaired balance/gait;Impaired mobility  Follow up Falls evaluation completed;Education provided - Falls evaluation completed;Education provided;Falls  prevention discussed  Comment - - -   PHQ 2/9 Scores 09/18/2019 09/17/2019 07/09/2019 04/02/2019 03/05/2019 12/26/2018 11/19/2018  PHQ - 2 Score 0 0 0 0 0 0 0  PHQ- 9 Score - - - - - - -  Exception Documentation - - - - - - -   THN CM Care Plan Problem One     Most Recent Value  Care Plan Problem One  Knowledge Deficit in self management of chf  Role Documenting the Problem One  [place] for Problem One  Active  THN Long Term Goal   Patient will follow up on health maintenance within the next 90 days  Interventions for Problem One Long Term Goal  Rn reiterated Health maintenance. Patient has received her COVID vaccines. RN discussed continue following up on eye exam. RN reiterated staying up to date with all vaccines. RN will follow up for reminders and compkiance.  THN CM Short Term Goal #1   Patient will not have a CHF exacerbation within the next 30 days  Interventions for Short Term Goal #1  RN will continue to reiterate the zones of congestive, medication adherence and weighing daily. RN will reiterate each outreach.  THN CM Short Term Goal #2   Patient will verbalize receiving the Exercise program activity booklet within the next 30 days  THN CM Short Term Goal #2 Start Date  12/18/19  Interventions for Short Term Goal #2  RN reiterates the importance of exercising. RN sent patient some easy exercises since having some pain in back and neck. RN will follow up for receipt.      Assessment:  Patient is weighing Patient does know and understand the CHF zones and action plan Patient is on oxygen 2 liters  Patient is adhering to low sodium diet Patient has received herr COVID vaccines  Plan:  RN reiterated keeping up with health maintenance Rn discussed mild exercises if appropriate with Dr RN sent Exercise program activity booklet RN reiterated healthy eating RN sent educational material on Meal planning RN will follow up outreach within the month of August.  Johny Shock BSN RN [place] [facility] [phone]

## 2019-12-31 ENCOUNTER — Ambulatory Visit (INDEPENDENT_AMBULATORY_CARE_PROVIDER_SITE_OTHER): Payer: Medicare Other

## 2019-12-31 DIAGNOSIS — Z95810 Presence of automatic (implantable) cardiac defibrillator: Secondary | ICD-10-CM

## 2019-12-31 DIAGNOSIS — I5022 Chronic systolic (congestive) heart failure: Secondary | ICD-10-CM | POA: Diagnosis not present

## 2020-01-01 ENCOUNTER — Encounter: Payer: Self-pay | Admitting: Family Medicine

## 2020-01-01 ENCOUNTER — Telehealth: Payer: Self-pay

## 2020-01-01 NOTE — Telephone Encounter (Signed)
Patient calls nurse line stating she is having her annual dental exam tomorrow at [facility] @1pm . Patient needs a letter faxed to them stating its ok for her to have cleaning and xrays.   The letter needs to be faxed to [facility], Attention [place]. [phone].

## 2020-01-01 NOTE — Progress Notes (Signed)
EPIC Encounter for ICM Monitoring  Patient Name: Veronica Shaw is a 69 y.o. female Date: 01/01/2020 Primary Care Physican: Caroline More, DO Primary Cardiologist:Cooper/Weaver PA Electrophysiologist: Lovena Le 6/2/2021Weight: 137lbs  Spoke with patient and reports feeling well at this time. Denies fluid symptoms.   OptiVolThoracic impedancenormal.  Prescribed:Furosemide40 mgTake 0.5 tablets (20 mg total) by mouthevery dayas needed for fluid (For swelling).   Labs: 03/05/2019 Creatinine 1.31, BUN 24, Potassium 4.9, Sodium 144, GFR 41-48 01/30/2019 Creatinine1.44, BUN22, Potassium4.5, Sodium139, [id number] A complete set of results can be found in Results Review.  Recommendations: No changes and encouraged to call if experiencing any fluid symptoms.  Follow-up plan: ICM clinic phone appointment on7/12/2019. 91 day device clinic remote transmission7/29/2021.   Copy of ICM check sent to Dr.Taylor.  3 month ICM trend: 12/31/2019    1 Year ICM trend:       [name], RN 01/01/2020 5:04 PM

## 2020-01-01 NOTE — Telephone Encounter (Signed)
Letter written. Please fax to [facility], [place], PGY-3 [place] [facility] 01/01/2020 1:42 PM

## 2020-01-02 NOTE — Telephone Encounter (Signed)
Letter faxed to [facility]. The patient has been updated.

## 2020-01-09 DIAGNOSIS — H26491 Other secondary cataract, right eye: Secondary | ICD-10-CM | POA: Diagnosis not present

## 2020-01-09 DIAGNOSIS — H35351 Cystoid macular degeneration, right eye: Secondary | ICD-10-CM | POA: Diagnosis not present

## 2020-01-09 DIAGNOSIS — H52201 Unspecified astigmatism, right eye: Secondary | ICD-10-CM | POA: Diagnosis not present

## 2020-01-09 DIAGNOSIS — E113291 Type 2 diabetes mellitus with mild nonproliferative diabetic retinopathy without macular edema, right eye: Secondary | ICD-10-CM | POA: Diagnosis not present

## 2020-01-09 LAB — HM DIABETES EYE EXAM

## 2020-01-29 ENCOUNTER — Telehealth: Payer: Self-pay

## 2020-01-29 DIAGNOSIS — Z6826 Body mass index (BMI) 26.0-26.9, adult: Secondary | ICD-10-CM | POA: Insufficient documentation

## 2020-01-29 NOTE — Telephone Encounter (Signed)
Patient calls nurse line reporting she had a "siezure" at [employer] a week ago. Patient reports she was checking out and "passed out" and was told by the "lady" behind her that she had a seizure. Patient reports she went home afterwards and felt fine, just tired. Patient was recently seen by her pain provider and she informed him of the incident and he advised she may have had a stroke. Due to time, a week since incident, I precepted with Nori Riis to see options. Nori Riis stated she mostly had neither given no side effects afterwards and to scheduled her with PCP in a week or two. Scheduled with Higinio Plan.

## 2020-01-30 ENCOUNTER — Other Ambulatory Visit: Payer: Self-pay | Admitting: Internal Medicine

## 2020-02-04 ENCOUNTER — Ambulatory Visit (INDEPENDENT_AMBULATORY_CARE_PROVIDER_SITE_OTHER): Payer: Medicare Other | Admitting: Family Medicine

## 2020-02-04 ENCOUNTER — Ambulatory Visit (HOSPITAL_COMMUNITY)
Admission: RE | Admit: 2020-02-04 | Discharge: 2020-02-04 | Disposition: A | Discharge status: Home / Self Care | Payer: Medicare Other | Source: Ambulatory Visit | Attending: Family Medicine | Admitting: Family Medicine

## 2020-02-04 ENCOUNTER — Ambulatory Visit (INDEPENDENT_AMBULATORY_CARE_PROVIDER_SITE_OTHER): Payer: Medicare Other

## 2020-02-04 ENCOUNTER — Other Ambulatory Visit: Payer: Self-pay

## 2020-02-04 VITALS — BP 122/80 | HR 87 | Ht 61.0 in | Wt 138.2 lb

## 2020-02-04 DIAGNOSIS — R55 Syncope and collapse: Secondary | ICD-10-CM | POA: Insufficient documentation

## 2020-02-04 DIAGNOSIS — I5022 Chronic systolic (congestive) heart failure: Secondary | ICD-10-CM | POA: Diagnosis not present

## 2020-02-04 DIAGNOSIS — Z95810 Presence of automatic (implantable) cardiac defibrillator: Secondary | ICD-10-CM | POA: Diagnosis not present

## 2020-02-04 DIAGNOSIS — I10 Essential (primary) hypertension: Secondary | ICD-10-CM

## 2020-02-04 NOTE — Progress Notes (Signed)
    SUBJECTIVE:   CHIEF COMPLAINT / HPI: Evaluate ?Syncopal episode  Veronica Shaw is a 69 year old female with NICM with ICD in place, HTN, T2 DM, and Parkinson's presenting for evaluation after a "passing out" episode in [place] 2 weeks ago.    Today, she is doing okay. States she doesn't remember much, remembers feeling a "buzz" from her ICD and after that just remembers waking up to the lady behind her in the line telling her she had a seizure. No EMS called via Sports administrator or bystanders. Doesn't know how long she was down for. She's not sure why she told her she had a seizure, unsure if she had jerking movements etc. No urinary or bowel incontinence or lip biting. [place] forward onto the counter, did not hit the ground or her head. Immediately aware of surroundings following event, walked to her car and drove home. Felt at baseline prior to event, no recent fever, cough, SOB, CP. No sick contacts.   Since then, neck feeling tight to the right more than usual, right sided ear pain with dizziness. Has had dizziness before for the past 20 years with parkinson's/etc. Long term prescription of meclizine and used this last night with relief. She has been feeling generally weak without focal weakness. Daughter reports she has seemed "more emotional" without slurring of speech or confusion. Denies any numbness/tingling, double/blurred vision. Taking medications as prescribed.   Going to get injections via her pain physician next week for her neck pain.    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  Speech easily understandable, no slurring.  EOMI, PERRLA, no nystagmus present.  Follows commands appropriately.  CN II-XII intact.  5/5 upper and lower extremity strength bilaterally, but easily fatigued throughout which is at baseline for her.  2/4 patellar reflexes bilaterally.  Finger-to-nose intact. Gait appropriate with walker.  No pronator drift.  ASSESSMENT/PLAN:   Syncope Reported LOC episode witnessed by bystanders following an ICD "buzz" approximately 2 weeks ago, still feeling generally fatigued.  Interestingly no EMS called and fell forward without head injury. Orthostatic vitals WNL and EKG similar to prior (LBBB present in 10/2019 as well) in the office today.  Unclear etiology of suspected syncopal episode.  Will certainly need to rule out arrhythmia or ICD malfunction given history provided, called her EP, Dr. Tanna Furry office, and set up appt on 7/8 for interrogation.  Could consider orthostatic/vasovagal especially with known Parkinson's disease, however would have expected prodromal symptoms.  Will obtain CBC, CMP to rule out electrolyte, liver/renal, or hematologic abnormality.  Doubt seizure without postictal state.      Discussed case with Dr. Wendy Poet.  Follow-up with electrophysiologist, Dr. Lovena Le, on Thursday 7/8.  Strict ED precautions discussed including any recurrent episode, lightheadedness/dizziness, chest pain, or shortness of breath.  Will follow up after EP visit.  [name], [place]

## 2020-02-04 NOTE — Patient Instructions (Signed)
It was wonderful to see you today.  If you have any further episodes, chest pain, shortness of breath, weakness, numbness/tingling please go to the ED immediately.  You have an appointment with your electrophysiologist, Dr. Lovena Le, on 7/8 at 11:15 AM.  Please make this appointment to have your ICD evaluated.

## 2020-02-05 ENCOUNTER — Encounter: Payer: Self-pay | Admitting: Family Medicine

## 2020-02-05 LAB — BRAIN NATRIURETIC PEPTIDE: BNP: 65.1 pg/mL (ref 0.0–100.0)

## 2020-02-05 LAB — COMPREHENSIVE METABOLIC PANEL
ALT: 10 IU/L (ref 0–32)
AST: 17 IU/L (ref 0–40)
Albumin/Globulin Ratio: 1.6 (ref 1.2–2.2)
Albumin: 4.4 g/dL (ref 3.8–4.8)
Alkaline Phosphatase: 132 IU/L — ABNORMAL HIGH (ref 48–121)
BUN/Creatinine Ratio: 16 (ref 12–28)
BUN: 21 mg/dL (ref 8–27)
Bilirubin Total: <0.2 mg/dL (ref 0.0–1.2)
CO2: 26 mmol/L (ref 20–29)
Calcium: 10.4 mg/dL — ABNORMAL HIGH (ref 8.7–10.3)
Chloride: 101 mmol/L (ref 96–106)
Creatinine, Ser: 1.33 mg/dL — ABNORMAL HIGH (ref 0.57–1.00)
GFR calc Af Amer: 47 mL/min/{1.73_m2} — ABNORMAL LOW (ref >59)
GFR calc non Af Amer: 41 mL/min/{1.73_m2} — ABNORMAL LOW (ref >59)
Globulin, Total: 2.8 g/dL (ref 1.5–4.5)
Glucose: 122 mg/dL — ABNORMAL HIGH (ref 65–99)
Potassium: 4.8 mmol/L (ref 3.5–5.2)
Sodium: 143 mmol/L (ref 134–144)
Total Protein: 7.2 g/dL (ref 6.0–8.5)

## 2020-02-05 LAB — LIPID PANEL
Chol/HDL Ratio: 4.2 ratio (ref 0.0–4.4)
Cholesterol, Total: 197 mg/dL (ref 100–199)
HDL: 47 mg/dL (ref >39)
LDL Chol Calc (NIH): 119 mg/dL — ABNORMAL HIGH (ref 0–99)
Triglycerides: 175 mg/dL — ABNORMAL HIGH (ref 0–149)
VLDL Cholesterol Cal: 31 mg/dL (ref 5–40)

## 2020-02-05 LAB — CBC WITH DIFFERENTIAL/PLATELET
Basophils Absolute: 0.1 10*3/uL (ref 0.0–0.2)
Basos: 1 %
EOS (ABSOLUTE): 0.5 10*3/uL — ABNORMAL HIGH (ref 0.0–0.4)
Eos: 7 %
Hematocrit: 39.1 % (ref 34.0–46.6)
Hemoglobin: 13.3 g/dL (ref 11.1–15.9)
Immature Grans (Abs): 0 10*3/uL (ref 0.0–0.1)
Immature Granulocytes: 0 %
Lymphocytes Absolute: 1.8 10*3/uL (ref 0.7–3.1)
Lymphs: 27 %
MCH: 30.4 pg (ref 26.6–33.0)
MCHC: 34 g/dL (ref 31.5–35.7)
MCV: 89 fL (ref 79–97)
Monocytes Absolute: 0.8 10*3/uL (ref 0.1–0.9)
Monocytes: 12 %
Neutrophils Absolute: 3.5 10*3/uL (ref 1.4–7.0)
Neutrophils: 53 %
Platelets: 224 10*3/uL (ref 150–450)
RBC: 4.38 x10E6/uL (ref 3.77–5.28)
RDW: 12.2 % (ref 11.7–15.4)
WBC: 6.6 10*3/uL (ref 3.4–10.8)

## 2020-02-05 NOTE — Assessment & Plan Note (Addendum)
Reported LOC episode witnessed by bystanders following an ICD "buzz" approximately 2 weeks ago, still feeling generally fatigued.  Interestingly no EMS called and fell forward without head injury. Orthostatic vitals WNL and EKG similar to prior (LBBB present in 10/2019 as well) in the office today.  Unclear etiology of suspected syncopal episode.  Will certainly need to rule out arrhythmia or ICD malfunction given history provided, called her EP, Dr. Tanna Furry office, and set up appt on 7/8 for interrogation.  Could consider orthostatic/vasovagal especially with known Parkinson's disease, however would have expected prodromal symptoms.  Will obtain CBC, CMP to rule out electrolyte, liver/renal, or hematologic abnormality.  Doubt seizure without postictal state.

## 2020-02-06 ENCOUNTER — Encounter: Payer: Self-pay | Admitting: Family Medicine

## 2020-02-06 ENCOUNTER — Ambulatory Visit (INDEPENDENT_AMBULATORY_CARE_PROVIDER_SITE_OTHER): Payer: Medicare Other | Admitting: Internal Medicine

## 2020-02-06 ENCOUNTER — Encounter: Payer: Self-pay | Admitting: Internal Medicine

## 2020-02-06 ENCOUNTER — Other Ambulatory Visit: Payer: Self-pay | Admitting: Family Medicine

## 2020-02-06 ENCOUNTER — Other Ambulatory Visit: Payer: Self-pay

## 2020-02-06 VITALS — BP 124/64 | HR 77 | Ht 61.0 in | Wt 140.0 lb

## 2020-02-06 DIAGNOSIS — I428 Other cardiomyopathies: Secondary | ICD-10-CM

## 2020-02-06 DIAGNOSIS — Z95810 Presence of automatic (implantable) cardiac defibrillator: Secondary | ICD-10-CM

## 2020-02-06 DIAGNOSIS — R946 Abnormal results of thyroid function studies: Secondary | ICD-10-CM

## 2020-02-06 DIAGNOSIS — R4189 Other symptoms and signs involving cognitive functions and awareness: Secondary | ICD-10-CM

## 2020-02-06 DIAGNOSIS — I5022 Chronic systolic (congestive) heart failure: Secondary | ICD-10-CM

## 2020-02-06 DIAGNOSIS — R55 Syncope and collapse: Secondary | ICD-10-CM

## 2020-02-06 LAB — CUP PACEART INCLINIC DEVICE CHECK
Battery Remaining Longevity: 35 mo
Battery Voltage: 2.96 V
Brady Statistic AP VP Percent: 0 %
Brady Statistic AP VS Percent: 0.04 %
Brady Statistic AS VP Percent: 0.03 %
Brady Statistic AS VS Percent: 99.93 %
Brady Statistic RA Percent Paced: 0.04 %
Brady Statistic RV Percent Paced: 0.03 %
Date Time Interrogation Session: 20210708093141
HighPow Impedance: 70 Ohm
Implantable Lead Implant Date: 20150427
Implantable Lead Implant Date: 20150427
Implantable Lead Location: 753859
Implantable Lead Location: 753860
Implantable Lead Model: 5076
Implantable Lead Model: 6935
Implantable Pulse Generator Implant Date: 20150427
Lead Channel Impedance Value: 399 Ohm
Lead Channel Impedance Value: 4047 Ohm
Lead Channel Impedance Value: 4047 Ohm
Lead Channel Impedance Value: 4047 Ohm
Lead Channel Impedance Value: 589 Ohm
Lead Channel Impedance Value: 646 Ohm
Lead Channel Pacing Threshold Amplitude: 0.5 V
Lead Channel Pacing Threshold Amplitude: 0.5 V
Lead Channel Pacing Threshold Pulse Width: 0.4 ms
Lead Channel Pacing Threshold Pulse Width: 0.4 ms
Lead Channel Sensing Intrinsic Amplitude: 13.625 mV
Lead Channel Sensing Intrinsic Amplitude: 2 mV
Lead Channel Setting Pacing Amplitude: 2 V
Lead Channel Setting Pacing Amplitude: 2.5 V
Lead Channel Setting Pacing Pulse Width: 0.4 ms
Lead Channel Setting Sensing Sensitivity: 0.3 mV

## 2020-02-06 NOTE — Patient Instructions (Signed)
Medication Instructions:  Your physician recommends that you continue on your current medications as directed. Please refer to the Current Medication list given to you today.  Labwork: None ordered.  Testing/Procedures: None ordered.  Follow-Up: Your physician wants you to follow-up in: one year with Dr. Lovena Le.   You will receive a reminder letter in the mail two months in advance. If you don't receive a letter, please call our office to schedule the follow-up appointment.  Remote monitoring is used to monitor your ICD from home. This monitoring reduces the number of office visits required to check your device to one time per year. It allows [place] to keep an eye on the functioning of your device to ensure it is working properly. You are scheduled for a device check from home on 02/27/2020. You may send your transmission at any time that day. If you have a wireless device, the transmission will be sent automatically. After your physician reviews your transmission, you will receive a postcard with your next transmission date.  Any Other Special Instructions Will Be Listed Below (If Applicable).  If you need a refill on your cardiac medications before your next appointment, please call your pharmacy.

## 2020-02-06 NOTE — Progress Notes (Signed)
HPI [name] presents today after experiencing a spell where she was in a store with a shopping cart and lost consciousness and thinks that she got shocked. She did not get shocked. She did not fall. The episode lasted seconds with little warning. She felt a little tired after. Interrogation of her device demonstrated no arrhythmias.  Allergies  Allergen Reactions  . Aspirin Swelling and Other (See Comments)    Other reaction(s): Other (See Comments) Causes nose bleeds *ONLY THE COATED ASA* Causes nose bleeds *ONLY THE COATED ASA*  . Penicillins Shortness Of Breath and Rash    Shortness of Breath - Throat felt like it was closing.   [name] [Conj Estrog-Medroxyprogest Ace] Shortness Of Breath    Throat swelling Throat swelling  . Ace Inhibitors Cough  . Simvastatin Other (See Comments) and Rash    Muscle aches     Current Outpatient Medications  Medication Sig Dispense Refill  . albuterol (PROVENTIL HFA;VENTOLIN HFA) 108 (90 Base) MCG/ACT inhaler Inhale 2 puffs into the lungs every 4 (four) hours as needed for wheezing or shortness of breath. 18 g 3  . Biotin 5000 MCG CAPS Take 1 tablet by mouth daily.    . budesonide-formoterol (SYMBICORT) 160-4.5 MCG/ACT inhaler Inhale 2 puffs into the lungs 2 (two) times daily. 1 Inhaler 0  . Calcium Carb-Cholecalciferol 600-800 MG-UNIT TABS Take 1 tablet by mouth daily. 90 tablet 3  . carbidopa-levodopa (SINEMET IR) 25-100 MG tablet TAKE 1 TABLET BY MOUTH THREE TIMES DAILY 270 tablet 0  . famotidine (PEPCID) 20 MG tablet TAKE 1 TABLET TWICE DAILY 180 tablet 2  . fluticasone (FLONASE SENSIMIST) 27.5 MCG/SPRAY nasal spray Place 2 sprays into the nose daily. 10 g 1  . furosemide (LASIX) 40 MG tablet TAKE 1/2 TABLET EVERY DAY AS NEEDED FOR  FLUID/SWELLING 30 tablet 3  . gabapentin (NEURONTIN) 100 MG capsule Take 300mg  (3 pills) at night before bed and 100mg  (1 pill) in the morning with breakfast. 120 capsule 1  . levalbuterol (XOPENEX)  0.63 MG/3ML nebulizer solution 1 vial every 4-6 hours as needed for wheezing and shortness of breath. 180 mL 5  . losartan (COZAAR) 25 MG tablet TAKE 1 TABLET EVERY DAY 90 tablet 3  . meclizine (ANTIVERT) 12.5 MG tablet Take 0.5 tablets (6.25 mg total) by mouth 3 (three) times daily as needed for dizziness. 30 tablet 0  . metFORMIN (GLUCOPHAGE) 500 MG tablet Take 2 tablets (1,000 mg total) by mouth 2 (two) times daily with a meal. 180 tablet 1  . methocarbamol (ROBAXIN) 750 MG tablet Take 750 mg by mouth as needed.     . metoprolol succinate (TOPROL-XL) 50 MG 24 hr tablet TAKE 1 TABLET TWICE DAILY 180 tablet 1  . potassium chloride (K-DUR,KLOR-CON) 10 MEQ tablet Take 1 tablet (10 mEq total) by mouth as needed (take with lasix as needed for swelling). 30 tablet 2  . spironolactone (ALDACTONE) 25 MG tablet TAKE 1/2 TABLET EVERY DAY 45 tablet 3  . Turmeric 500 MG TABS Take 1 tablet by mouth daily.     No current facility-administered medications for this visit.     Past Medical History:  Diagnosis Date  . Asthma   . Chronic systolic CHF (congestive heart failure) ([place])    a. cMRI 4/15: EF 34% and findings - c/w NICM, normal RV size and function (RVEF 61%), Mild MR // b. Echo 2/15:  EF 30-35%, diff HK, ant-sept AK, Gr 2 DD, mild MR, trivial  TR  //  c. Echo 5/17: EF 20-25%, severe diffuse HK, marked systolic dyssynchrony, grade 1 diastolic dysfunction, mild MR  //  d. RHC 5/17: Fick CO 2.9, RVSP 19, PASP 15, PW mean 2, low filing pressures and preserved CO   . Diabetes mellitus   . Flu 10/17/2017  . Gastritis   . History of echocardiogram    Echo 6/18: EF 30-35, diffuse HK, grade 1 diastolic dysfunction, trivial MR, mild LAE, mild TR, no pericardial effusion  . History of nuclear stress test    Myoview 5/18: EF 49, no ischemia, inferoseptal defect c/w LBBB artifact (intermediate risk due to EF < 50).  [name] Kitchen HTN (hypertension)   . Hyperlipidemia   . NICM (nonischemic cardiomyopathy) ([place])    a.  Nuclear 5/13: Normal stress nuclear study. LV Ejection Fraction: 58%  //  b. LHC 10/14: Minor luminal irregularity in prox LAD, EF 35%   . Parkinson disease ([place])   . Plantar fasciitis   . Sleep apnea    was retested and no longer had it and so d/c CPAP  . Urticaria     ROS:   All systems reviewed and negative except as noted in the HPI.   Past Surgical History:  Procedure Laterality Date  . BREAST EXCISIONAL BIOPSY Left 1970   benign cyst  . BREAST SURGERY Left   . BUNIONECTOMY    . CARDIAC CATHETERIZATION N/A 12/16/2015   Procedure: Right Heart Cath;  Surgeon: Sherren Mocha, MD;  Location: [facility];  Service: Cardiovascular;  Laterality: N/A;  . CHOLECYSTECTOMY    . eye lid surgery Bilateral 05/09/2019  . IMPLANTABLE CARDIOVERTER DEFIBRILLATOR IMPLANT  11-25-13   MDT dual chamber ICD implanted by [name] for primary prevention  . IMPLANTABLE CARDIOVERTER DEFIBRILLATOR IMPLANT N/A 11/25/2013   Procedure: IMPLANTABLE CARDIOVERTER DEFIBRILLATOR IMPLANT;  Surgeon: [name], MD;  Location: [facility] CATH LAB;  Service: Cardiovascular;  Laterality: N/A;  . LEFT AND RIGHT HEART CATHETERIZATION WITH CORONARY ANGIOGRAM N/A 05/31/2013   Procedure: LEFT AND RIGHT HEART CATHETERIZATION WITH CORONARY ANGIOGRAM;  Surgeon: [name], MD;  Location: [facility] CATH LAB;  Service: Cardiovascular;  Laterality: N/A;  . TONSILLECTOMY    . TUBAL LIGATION       Family History  Problem Relation Age of Onset  . Coronary artery disease Father        Died age 50  . Heart attack Father   . Diabetes Father   . Coronary artery disease Mother        Died age 63  . Heart attack Mother   . Diabetes Mother   . Parkinson's disease Sister   . Heart disease Sister   . Hepatitis C Sister   . Diabetes Sister   . Diabetes Son 37       T1DM  . Healthy Daughter   . Diabetes Sister   . Heart disease Sister   . Diabetes Sister   . Heart disease Sister   . Breast cancer Neg Hx      Social  History   Socioeconomic History  . Marital status: Married    Spouse name: Not on file  . Number of children: 2  . Years of education: Not on file  . Highest education level: 12th grade  Occupational History  . Occupation: retired    Comment: CNA  Tobacco Use  . Smoking status: Never Smoker  . Smokeless tobacco: Never Used  . Tobacco comment: exposed to smoke during child hood (  parents)  Vaping Use  . Vaping Use: Never used  Substance and Sexual Activity  . Alcohol use: No    Alcohol/week: 0.0 standard drinks  . Drug use: No  . Sexual activity: Yes  Other Topics Concern  . Not on file  Social History Narrative   Lives at home with husband and the dog.   2 children   Right handed   Drinks coffee, tea, and soda    Social Determinants of Health   Financial Resource Strain:   . Difficulty of Paying Living Expenses:   Food Insecurity: No Food Insecurity  . Worried About Charity fundraiser in the Last Year: Never true  . Ran Out of Food in the Last Year: Never true  Transportation Needs: No Transportation Needs  . Lack of Transportation (Medical): No  . Lack of Transportation (Non-Medical): No  Physical Activity:   . Days of Exercise per Week:   . Minutes of Exercise per Session:   Stress:   . Feeling of Stress :   Social Connections:   . Frequency of Communication with Friends and Family:   . Frequency of Social Gatherings with Friends and Family:   . Attends Religious Services:   . Active Member of Clubs or Organizations:   . Attends Archivist Meetings:   [name] Kitchen Marital Status:   Intimate Partner Violence:   . Fear of Current or Ex-Partner:   . Emotionally Abused:   [name] Kitchen Physically Abused:   . Sexually Abused:      BP 124/64   Pulse 77   Ht 5\' 1"  (1.549 m)   Wt 140 lb (63.5 kg)   LMP 08/01/2000 (Approximate)   BMI 26.45 kg/m   Physical Exam:  Well appearing NAD HEENT: Unremarkable Neck:  6 cm JVD, no thyromegally Lymphatics:  No  adenopathy Back:  No CVA tenderness Lungs:  Clear with no wheezes HEART:  Regular rate rhythm, no murmurs, no rubs, no clicks Abd:  soft, positive bowel sounds, no organomegally, no rebound, no guarding Ext:  2 plus pulses, no edema, no cyanosis, no clubbing Skin:  No rashes no nodules Neuro:  CN II through XII intact, motor grossly intact  EKG - NSR with LBBB  DEVICE  Normal device function.  See PaceArt for details.   Assess/Plan: 1. Altered consciousness - the etiology is unclear. She did not have a ventricular arrhythmia. She will undergo watchful waiting. 2. Chronic systolic heart failure - she admits to being sedentary. I have asked her to increase her activity. She will avoid salty foods. No change in her meds. 3. ICD - interrogation demonstrates no arrhythmias and normal device function. She has about 4 years of longevity. 4. HTN - her bp is well controlled today. No change in meds.  Mikle Bosworth.D.

## 2020-02-07 NOTE — Progress Notes (Signed)
EPIC Encounter for ICM Monitoring  Patient Name: Veronica Shaw is a 69 y.o. female Date: 02/07/2020 Primary Care Physican: [name], DO Primary Cardiologist:Cooper/Weaver PA Electrophysiologist: Lovena Le 7/8/2021Office Weight: 140lbs  Transmission reviwed.  Pt had office visit with [name] on 02/06/2020 regarding passing out episode in June (device check normal in office)  OptiVolThoracic impedancenormal.  Prescribed:Furosemide40 mgTake 0.5 tablets (20 mg total) by moutheverydayas needed for fluid (For swelling).   Labs: 02/04/2020 Creatinine 1.33, BUN 21, Potassium 4.8, Sodium 143, GFR 41-47 11/01/2019 Creatinine 1.13, BUN 19, Potassium 4.2, Sodium 143, GFR 49-57  A complete set of results can be found in Results Review.  Recommendations: None  Follow-up plan: ICM clinic phone appointment on7/12/2019. 91 day device clinic remote transmission7/29/2021.   EP/Cardiology Office Visits: Recalls 10/31/2020 with Dr. Burt Knack and 02/05/2021 for [name].    Copy of ICM check sent to Dr. Lovena Le.   3 month ICM trend: 02/04/2020    1 Year ICM trend:       [name], RN 02/07/2020 9:55 AM

## 2020-02-08 LAB — TSH: TSH: 4.42 u[IU]/mL (ref 0.450–4.500)

## 2020-02-08 LAB — SPECIMEN STATUS REPORT

## 2020-02-17 DIAGNOSIS — M47812 Spondylosis without myelopathy or radiculopathy, cervical region: Secondary | ICD-10-CM | POA: Diagnosis not present

## 2020-02-26 ENCOUNTER — Other Ambulatory Visit: Payer: Self-pay | Admitting: Neurology

## 2020-02-27 ENCOUNTER — Ambulatory Visit (INDEPENDENT_AMBULATORY_CARE_PROVIDER_SITE_OTHER): Payer: Medicare Other | Admitting: *Deleted

## 2020-02-27 DIAGNOSIS — I428 Other cardiomyopathies: Secondary | ICD-10-CM | POA: Diagnosis not present

## 2020-02-27 LAB — CUP PACEART REMOTE DEVICE CHECK
Battery Remaining Longevity: 36 mo
Battery Voltage: 2.96 V
Brady Statistic AP VP Percent: 0 %
Brady Statistic AP VS Percent: 0.13 %
Brady Statistic AS VP Percent: 0.03 %
Brady Statistic AS VS Percent: 99.84 %
Brady Statistic RA Percent Paced: 0.13 %
Brady Statistic RV Percent Paced: 0.04 %
Date Time Interrogation Session: 20210729001603
HighPow Impedance: 76 Ohm
Implantable Lead Implant Date: 20150427
Implantable Lead Implant Date: 20150427
Implantable Lead Location: 753859
Implantable Lead Location: 753860
Implantable Lead Model: 5076
Implantable Lead Model: 6935
Implantable Pulse Generator Implant Date: 20150427
Lead Channel Impedance Value: 399 Ohm
Lead Channel Impedance Value: 4047 Ohm
Lead Channel Impedance Value: 4047 Ohm
Lead Channel Impedance Value: 4047 Ohm
Lead Channel Impedance Value: 551 Ohm
Lead Channel Impedance Value: 646 Ohm
Lead Channel Pacing Threshold Amplitude: 0.5 V
Lead Channel Pacing Threshold Amplitude: 0.5 V
Lead Channel Pacing Threshold Pulse Width: 0.4 ms
Lead Channel Pacing Threshold Pulse Width: 0.4 ms
Lead Channel Sensing Intrinsic Amplitude: 12 mV
Lead Channel Sensing Intrinsic Amplitude: 12 mV
Lead Channel Sensing Intrinsic Amplitude: 2.375 mV
Lead Channel Sensing Intrinsic Amplitude: 2.375 mV
Lead Channel Setting Pacing Amplitude: 2 V
Lead Channel Setting Pacing Amplitude: 2.5 V
Lead Channel Setting Pacing Pulse Width: 0.4 ms
Lead Channel Setting Sensing Sensitivity: 0.3 mV

## 2020-03-02 NOTE — Progress Notes (Signed)
Remote ICD transmission.   

## 2020-03-09 ENCOUNTER — Ambulatory Visit (INDEPENDENT_AMBULATORY_CARE_PROVIDER_SITE_OTHER): Payer: Medicare Other

## 2020-03-09 DIAGNOSIS — Z95810 Presence of automatic (implantable) cardiac defibrillator: Secondary | ICD-10-CM | POA: Diagnosis not present

## 2020-03-09 DIAGNOSIS — I5022 Chronic systolic (congestive) heart failure: Secondary | ICD-10-CM | POA: Diagnosis not present

## 2020-03-11 NOTE — Progress Notes (Signed)
EPIC Encounter for ICM Monitoring  Patient Name: Veronica Shaw is a 69 y.o. female Date: 03/11/2020 Primary Care Physican: [name], DO Primary Cardiologist:Cooper/Weaver PA Electrophysiologist: Lovena Le 7/8/2021Office Weight: 140lbs  Spoke with patient and reports feeling well at this time.  Denies fluid symptoms.    OptiVolThoracic impedancenormal.  Prescribed:Furosemide40 mgTake 0.5 tablets (20 mg total) by moutheverydayas needed for fluid (For swelling).   Labs: 02/04/2020 Creatinine 1.33, BUN 21, Potassium 4.8, Sodium 143, GFR 41-47 11/01/2019 Creatinine 1.13, BUN 19, Potassium 4.2, Sodium 143, GFR 49-57  A complete set of results can be found in Results Review.  Recommendations: No changes and encouraged to call if experiencing any fluid symptoms.  Follow-up plan: ICM clinic phone appointment on9/13/2021. 91 day device clinic remote transmission10/28/2021.   EP/Cardiology Office Visits: Recalls 10/31/2020 with Dr. Burt Knack and 02/05/2021 for [name].    Copy of ICM check sent to Dr. Lovena Le.   3 month ICM trend: 03/09/2020    1 Year ICM trend:       [name], RN 03/11/2020 12:48 PM

## 2020-03-24 ENCOUNTER — Other Ambulatory Visit: Payer: Self-pay | Admitting: *Deleted

## 2020-03-24 NOTE — Patient Outreach (Signed)
[facility]) Care Management  03/24/2020  Veronica Shaw 18-Apr-1951 [id number]  RN Health Coach attempted follow up outreach call to patient.  Patient was on the road driving. RN Health Coach will reschedule.  Plan: RN will call patient again within 30 days.  [place] [facility] Management [phone]

## 2020-03-25 ENCOUNTER — Other Ambulatory Visit: Payer: Self-pay

## 2020-03-25 MED ORDER — FUROSEMIDE 40 MG PO TABS: ORAL_TABLET | ORAL | 3 refills | Status: DC

## 2020-03-31 ENCOUNTER — Encounter: Payer: Self-pay | Admitting: Family Medicine

## 2020-03-31 ENCOUNTER — Other Ambulatory Visit: Payer: Self-pay

## 2020-03-31 ENCOUNTER — Other Ambulatory Visit: Payer: Self-pay | Admitting: Cardiovascular Disease

## 2020-03-31 ENCOUNTER — Ambulatory Visit (INDEPENDENT_AMBULATORY_CARE_PROVIDER_SITE_OTHER): Payer: Medicare Other | Admitting: Family Medicine

## 2020-03-31 ENCOUNTER — Other Ambulatory Visit: Payer: Self-pay | Admitting: *Deleted

## 2020-03-31 VITALS — BP 122/60 | HR 86 | Wt 136.0 lb

## 2020-03-31 DIAGNOSIS — G20 Parkinson's disease: Secondary | ICD-10-CM

## 2020-03-31 DIAGNOSIS — R252 Cramp and spasm: Secondary | ICD-10-CM

## 2020-03-31 DIAGNOSIS — I10 Essential (primary) hypertension: Secondary | ICD-10-CM | POA: Diagnosis not present

## 2020-03-31 NOTE — Patient Outreach (Signed)
[facility]) Care Management  [place]  03/31/2020   Veronica Shaw 1951-06-12 [id number]  RN Health Coach telephone call to patient.  [employer]aa compliance verified. Per patient she has been having severe cramping in her legs. Patient stated that she was informed by her Dr that she had been taking her losartan incorrectly. Per patient she has noted sometimes her blood pressure was real low. RN discussed parameters of blood pressure and when to notify Dr before taking medications. Patient stated it has been as low as in the 70"S and she was feeling dizzy. RN  discussed with patient all the medications she is taking and what they are for. . Patient is having some short term memory loss. Per patient she will discuss with the neurologist . Patient has agreed to further outreach calls.   Encounter Medications:  Outpatient Encounter Medications as of 03/31/2020  Medication Sig Note  . losartan (COZAAR) 25 MG tablet TAKE 1 TABLET EVERY DAY   . metoprolol succinate (TOPROL-XL) 50 MG 24 hr tablet TAKE 1 TABLET TWICE DAILY   . potassium chloride (K-DUR,KLOR-CON) 10 MEQ tablet Take 1 tablet (10 mEq total) by mouth as needed (take with lasix as needed for swelling).   [name] Kitchen spironolactone (ALDACTONE) 25 MG tablet TAKE 1/2 TABLET EVERY DAY   . Turmeric 500 MG TABS Take 1 tablet by mouth daily.   [name] Kitchen albuterol (PROVENTIL HFA;VENTOLIN HFA) 108 (90 Base) MCG/ACT inhaler Inhale 2 puffs into the lungs every 4 (four) hours as needed for wheezing or shortness of breath.   . Biotin 5000 MCG CAPS Take 1 tablet by mouth daily. (Patient not taking: Reported on 03/31/2020)   . budesonide-formoterol (SYMBICORT) 160-4.5 MCG/ACT inhaler Inhale 2 puffs into the lungs 2 (two) times daily.   . Calcium Carb-Cholecalciferol 600-800 MG-UNIT TABS Take 1 tablet by mouth daily. (Patient not taking: Reported on 03/31/2020)   . carbidopa-levodopa (SINEMET IR) 25-100 MG tablet TAKE 1 TABLET BY MOUTH THREE TIMES  DAILY   . famotidine (PEPCID) 20 MG tablet TAKE 1 TABLET TWICE DAILY   . fluticasone (FLONASE SENSIMIST) 27.5 MCG/SPRAY nasal spray Place 2 sprays into the nose daily. (Patient not taking: Reported on 03/31/2020)   . furosemide (LASIX) 40 MG tablet TAKE 1/2 TABLET EVERY DAY AS NEEDED FOR  FLUID/SWELLING   . gabapentin (NEURONTIN) 100 MG capsule Take 300mg  (3 pills) at night before bed and 100mg  (1 pill) in the morning with breakfast. (Patient not taking: Reported on 03/31/2020)   . levalbuterol (XOPENEX) 0.63 MG/3ML nebulizer solution 1 vial every 4-6 hours as needed for wheezing and shortness of breath. 03/31/2020: As needed especially in the winter  . meclizine (ANTIVERT) 12.5 MG tablet Take 0.5 tablets (6.25 mg total) by mouth 3 (three) times daily as needed for dizziness. (Patient not taking: Reported on 03/31/2020)   . metFORMIN (GLUCOPHAGE) 500 MG tablet Take 2 tablets (1,000 mg total) by mouth 2 (two) times daily with a meal.   . methocarbamol (ROBAXIN) 750 MG tablet Take 750 mg by mouth as needed.  (Patient not taking: Reported on 03/31/2020)    No facility-administered encounter medications on file as of 03/31/2020.    Functional Status:  In your present state of health, do you have any difficulty performing the following activities: 07/09/2019 04/02/2019  Hearing? N N  Vision? Y Y  Comment - Patient stated he eyelids are droopy and eyes always puffy  Difficulty concentrating or making decisions? Y (No Data)  Comment - per patient  she is forgetful  Walking or climbing stairs? Y Y  Comment - pt has shortness of breath of on exertion  Dressing or bathing? N N  Doing errands, shopping? Y Y  Comment - family does most of the shopping. Patient is doing a little driving now  Conservation officer, nature and eating ? N N  Using the Toilet? N N  In the past six months, have you accidently leaked urine? Y Y  Do you have problems with loss of bowel control? N N  Managing your Medications? N N  Managing your  Finances? N N  Housekeeping or managing your Housekeeping? N N  Some recent data might be hidden    Fall/Depression Screening: Fall Risk  03/31/2020 09/18/2019 09/17/2019  Falls in the past year? 1 1 1   Comment - - -  Number falls in past yr: 0 0 1  Injury with Fall? 0 0 0  Comment - - -  Risk Factor Category  - Low Risk (0 Points) Moderate Risk (1 Point)  Risk for fall due to : - - History of fall(s);Impaired balance/gait;Impaired mobility  Follow up - Falls evaluation completed;Education provided -  Comment - - -   PHQ 2/9 Scores 03/31/2020 09/18/2019 09/17/2019 07/09/2019 04/02/2019 03/05/2019 12/26/2018  PHQ - 2 Score 0 0 0 0 0 0 0  PHQ- 9 Score 3 - - - - - -  Exception Documentation - - - - - - -   Goals Addressed            This Visit's Progress   . Client verbalize knowledge of Heart Failure disease self management skills by monitoring weight, medication adherence and low sodium diet       CARE PLAN ENTRY (see longtitudinal plan of care for additional care plan information)   Current Barriers:  [name] Kitchen Knowledge deficit related to basic heart failure pathophysiology and self care management . Knowledge Deficits related to heart failure medications . Cognitive Deficits  Case Manager Clinical Goal(s):  [name] Kitchen Over the next 90 days, patient will verbalize understanding of Heart Failure Action Plan and when to call doctor . Over the next 90 days, patient will take all Heart Failure mediations as prescribed . Over the next 90 days, patient will weigh daily and record (notifying MD of 3 lb weight gain over night or 5 lb in a week) . Patient will verbalize two symptoms of CHF exacerbation within the next 90 days.   . Patient will verbalize receiving annual flu vaccine within the next 90 days.   Interventions:  . Basic overview and discussion of pathophysiology of Heart Failure reviewed  . Provided verbal education on low sodium diet . Provided written education on low sodium diet . Advised  patient to weigh each morning after emptying bladder . Discussed importance of daily weight and advised patient to weigh and record daily . Reviewed role of diuretics in prevention of fluid overload and management of heart failure . Discussed all medications that patient is taking and their purpose  Patient Self Care Activities:  . Takes Heart Failure Medications as prescribed . Weighs daily and record (notifying MD of 3 lb weight gain over night or 5 lb in a week) . Verbalizes understanding of and follows CHF Action Plan . Adheres to low sodium diet  Initial goal documentation        Assessment:  Patient has been weighing self Patient has been taking medication but the losartan incorrectly Patient has been checking blood pressure and noted  it was low but didn't notify Dr Patient adhering low sodium diet Plan:  Patient will follow parameters of blood pressure and notify DR of abnormal reading Patient will check BP for taking medications in AM Patient will weigh daily Patient will continue to exercise Patient will notify neurologist of memory changes Patient will follow up with all appointments RN will send assessment to PCP RN will follow up within the month of October  Obryan Radu [place] Management [phone].

## 2020-03-31 NOTE — Assessment & Plan Note (Addendum)
-  BP in office today 122/60, patient states that she has not taken her BP meds prior to her appointment today -Instructed patient to take 1 tablet of losartan once a day as previously prescribed, instead of 2 tablets -Instructed to stay hydrated and keep a glass of water at bedside to drink every morning, be cautious when changing positions too quickly  -Follow up with PCP, Dr. Higinio Plan, in 2 months to reassess current HTN regimen

## 2020-03-31 NOTE — Assessment & Plan Note (Addendum)
-  awaiting BMP and Mg, will notify patient of any abnormal results -Patient taking more losartan than prescribed which could contribute to her muscle cramps. -Recommended stretches and hot showers to alleviate the affected area -Likely multiple contributing factors including parkinson's disease and history of arthritis -Appropriately reviewed med rec -Follow up in 2 months

## 2020-03-31 NOTE — Patient Instructions (Signed)
It was great meeting you today!  Please take losartan once a day instead of twice. We are getting lab work, I will notify you of any abnormal results. Please keep a glass of water at bedside. Continue to monitor blood pressures, we don't want them getting too low. Try doing various stretching exercises and hot showers to help the muscle cramps.   Follow up with PCP, Dr. Higinio Plan in 2 months. Thank you for allowing me to be a part of your medical care!

## 2020-03-31 NOTE — Progress Notes (Signed)
    SUBJECTIVE:   CHIEF COMPLAINT / HPI:   Leg cramping Patient presents to the clinic complaining of bilateral leg cramping. States that it started a year ago but then improved and restarted a few months ago. Improved from experiencing 17 cramps a day to 6-8 cramps. Endorses 10/10 associated pain that does not radiate. Sleeping and moving positions can cause the cramping, 1 tablespoon of mustard resolves the pain. Denies any other associated symptoms with the cramping. Not taking a statin drug and denies any associated LE edema. States that the cramping is bilaterally, but more prominent on the left LE and foot. Admits to having a history of arthritis.   PERTINENT  PMH / PSH:   Parkinson's disease Follows up with neurologist regularly. Currently taking carbidopa-levadopa, but sometimes takes 1/2 tablet daily.   Hypertension Compliant on medication regimen. Takes 2 tablets of losartan instead of 1 tablet daily. Monitors BP at home, generally ranges around 76-73 systolic and 55 diastolic. Denies chest pain and dyspnea. Admits to occasional dizziness when she changes positions or moves her head too fast.  OBJECTIVE:   BP 122/60   Pulse 86   Wt 136 lb (61.7 kg)   LMP 08/01/2000 (Approximate)   SpO2 96%   BMI 25.70 kg/m   General: Patient well-appearing, in no acute distress. HEENT: supple neck, clear tympanic membrane  Cardio: RRR, no murmurs noted Resp: lungs clear to auscultation bilaterally, no rales or rhonchi noted Abdomen: nontender, active bowel sounds present Ext: distal pulses strong and equal bilaterally, no LE edema noted bilaterally, no calf discrepancy noted  Neuro: follows commands appropriately, sensation decreased on left LE and foot compared to right side, ambulates with assistance of cane, slight rigidity of UE bilaterally, normal muscle tone in LE bilaterally  Psych: mood appropriate  ASSESSMENT/PLAN:   Leg cramping -awaiting BMP and Mg, will notify patient of  any abnormal results -Patient taking more losartan than prescribed which could contribute to her muscle cramps. -Recommended stretches and hot showers to alleviate the affected area -Likely multiple contributing factors including parkinson's disease and history of arthritis -Appropriately reviewed med rec -Follow up in 2 months  Parkinson disease ([place]) -States that she does not notice specific effects that impact her daily life  -Continue to follow up with neurology, continue their recs including carbidopa/levadopa as prescribed  Essential hypertension -BP in office today 122/60, patient states that she has not taken her BP meds prior to her appointment today -Instructed patient to take 1 tablet of losartan once a day as previously prescribed, instead of 2 tablets -Instructed to stay hydrated and keep a glass of water at bedside to drink every morning, be cautious when changing positions too quickly  -Follow up with PCP, Dr. Higinio Plan, in 2 months to reassess current HTN regimen      Veronica Shaw, Veronica Shaw

## 2020-03-31 NOTE — Assessment & Plan Note (Signed)
-  States that she does not notice specific effects that impact her daily life  -Continue to follow up with neurology, continue their recs including carbidopa/levadopa as prescribed

## 2020-04-01 LAB — BASIC METABOLIC PANEL
BUN/Creatinine Ratio: 16 (ref 12–28)
BUN: 18 mg/dL (ref 8–27)
CO2: 25 mmol/L (ref 20–29)
Calcium: 10.1 mg/dL (ref 8.7–10.3)
Chloride: 99 mmol/L (ref 96–106)
Creatinine, Ser: 1.13 mg/dL — ABNORMAL HIGH (ref 0.57–1.00)
GFR calc Af Amer: 57 mL/min/{1.73_m2} — ABNORMAL LOW (ref >59)
GFR calc non Af Amer: 49 mL/min/{1.73_m2} — ABNORMAL LOW (ref >59)
Glucose: 146 mg/dL — ABNORMAL HIGH (ref 65–99)
Potassium: 3.8 mmol/L (ref 3.5–5.2)
Sodium: 141 mmol/L (ref 134–144)

## 2020-04-01 LAB — MAGNESIUM: Magnesium: 1.6 mg/dL (ref 1.6–2.3)

## 2020-04-07 NOTE — Progress Notes (Signed)
Assessment/Plan:   1.  Parkinsons Disease  -Patient experiencing lower extremity cramping.  Explained to the patient that usually, lower extremity cramping and Parkinson's patient is due to need for more dopamine, or lack of it on a regular schedule.  Will add carbidopa/levodopa 50/200 q hs  -needs to take carbidopa/levodopa 25/100 at 7am/11am/4pm.  She is missing the middle of the day dosage.  Compliance is definitely an issue here.  -we will refer to the neurorehab center for PT  -use walker 2.  Memory change  -Last neurocognitive testing was done in July, 2020 and was done through a televisit encounter.  This was done in [place] with Dr. Lorelle Gibbs.  Diagnosis was moderate level of anxiety and depression.  The psychologist did state patient had evidence of dementia but also that the dementia diagnosis was based on presence of functional difficulties.  Pt asks about meds for memory change today.  Discussed with her that I think that it would be best to treat the underlying anxiety/depression first but she declines (and never took the mirtazipine given).  I also think that repeating the neurocog testing in person could be of great value.  She agrees and we will schedule it.  -no driving.  Getting lost   Subjective:   Veronica Shaw was seen today in follow up for Parkinsons disease.  My previous records were reviewed prior to todays visit as well as outside records available to me.  The patient has not been seen in over a year in our clinic.  At last visit, we reviewed her neurocognitive testing, which was unfortunately done just through televisit encounter.  This identified significant anxiety and depression, which were felt to be playing a role in cognitive dysfunction.  Patient denied that, but definitely admitted to insomnia, so we ultimately decided to start mirtazapine.  She doesn't remember starting it.  She also was supposed to start counseling with Corwin Levins, but when Judson Roch  called her, she declined counseling.  She canceled her December follow-up with me.  Noted that she has been following with the resident clinic.  She was there on August 31 complaining about leg cramping.  They noted that she was taking more losartan than prescribed and wondered if that was contributing to the cramping.  Cramping is mostly at night.  She states that her PCP told her to get some medication here for memory.  She even went on the highway the wrong way and is getting lost some.    Current prescribed movement disorder medications: Carbidopa/levodopa 25/100, 1 tablet 3 times per day ("I always forget the afternoon dose at twelve o'clock") Mirtazapine, 15 mg nightly (patient not taking)  PREVIOUS MEDICATIONS:  Mirtazapine given but never taken per pt  ALLERGIES:   Allergies  Allergen Reactions  . Aspirin Swelling and Other (See Comments)    Other reaction(s): Other (See Comments) Causes nose bleeds *ONLY THE COATED ASA* Causes nose bleeds *ONLY THE COATED ASA*  . Penicillins Shortness Of Breath and Rash    Shortness of Breath - Throat felt like it was closing.   [name] [Conj Estrog-Medroxyprogest Ace] Shortness Of Breath    Throat swelling Throat swelling  . Ace Inhibitors Cough  . Simvastatin Other (See Comments) and Rash    Muscle aches    CURRENT MEDICATIONS:  Outpatient Encounter Medications as of 04/08/2020  Medication Sig  . albuterol (PROVENTIL HFA;VENTOLIN HFA) 108 (90 Base) MCG/ACT inhaler Inhale 2 puffs into the lungs every 4 (four) hours  as needed for wheezing or shortness of breath.  . Biotin 5000 MCG CAPS Take 1 tablet by mouth daily. (Patient not taking: Reported on 03/31/2020)  . budesonide-formoterol (SYMBICORT) 160-4.5 MCG/ACT inhaler Inhale 2 puffs into the lungs 2 (two) times daily.  . Calcium Carb-Cholecalciferol 600-800 MG-UNIT TABS Take 1 tablet by mouth daily. (Patient not taking: Reported on 03/31/2020)  . carbidopa-levodopa (SINEMET IR) 25-100 MG  tablet TAKE 1 TABLET BY MOUTH THREE TIMES DAILY  . famotidine (PEPCID) 20 MG tablet TAKE 1 TABLET TWICE DAILY  . fluticasone (FLONASE SENSIMIST) 27.5 MCG/SPRAY nasal spray Place 2 sprays into the nose daily. (Patient not taking: Reported on 03/31/2020)  . furosemide (LASIX) 40 MG tablet TAKE 1/2 TABLET EVERY DAY AS NEEDED FOR  FLUID/SWELLING  . gabapentin (NEURONTIN) 100 MG capsule Take 300mg  (3 pills) at night before bed and 100mg  (1 pill) in the morning with breakfast. (Patient not taking: Reported on 03/31/2020)  . levalbuterol (XOPENEX) 0.63 MG/3ML nebulizer solution 1 vial every 4-6 hours as needed for wheezing and shortness of breath.  . losartan (COZAAR) 25 MG tablet TAKE 1 TABLET EVERY DAY  . meclizine (ANTIVERT) 12.5 MG tablet Take 0.5 tablets (6.25 mg total) by mouth 3 (three) times daily as needed for dizziness. (Patient not taking: Reported on 03/31/2020)  . metFORMIN (GLUCOPHAGE) 500 MG tablet Take 2 tablets (1,000 mg total) by mouth 2 (two) times daily with a meal.  . methocarbamol (ROBAXIN) 750 MG tablet Take 750 mg by mouth as needed.  (Patient not taking: Reported on 03/31/2020)  . metoprolol succinate (TOPROL-XL) 50 MG 24 hr tablet TAKE 1 TABLET TWICE DAILY  . potassium chloride (K-DUR,KLOR-CON) 10 MEQ tablet Take 1 tablet (10 mEq total) by mouth as needed (take with lasix as needed for swelling).  [name] Kitchen spironolactone (ALDACTONE) 25 MG tablet TAKE 1/2 TABLET EVERY DAY  . Turmeric 500 MG TABS Take 1 tablet by mouth daily.   No facility-administered encounter medications on file as of 04/08/2020.    Objective:   PHYSICAL EXAMINATION:    VITALS:  There were no vitals filed for this visit.  GEN:  The patient appears stated age and is in NAD. HEENT:  Normocephalic, atraumatic.  The mucous membranes are moist. The superficial temporal arteries are without ropiness or tenderness. CV:  RRR Lungs:  CTAB Neck/HEME:  There are no carotid bruits bilaterally.  Neurological  examination:  Orientation: The patient is alert and oriented x3. Cranial nerves: There is good facial symmetry with mild facial hypomimia. The speech is fluent and clear. Soft palate rises symmetrically and there is no tongue deviation. Hearing is intact to conversational tone. Sensation: Sensation is intact to light touch throughout Motor: Strength is at least antigravity x4.  Movement examination: Tone: There is mild increased tone in the bilateral upper extremities Abnormal movements: None Coordination:  There is minimal decremation with RAM's Gait and Station: The patient has no difficulty arising out of a deep-seated chair without the use of the hands. The patient's stride length is decreased, and she is off balance even with her cane.    I have reviewed and interpreted the following labs independently    Chemistry      Component Value Date/Time   NA 141 03/31/2020 0933   K 3.8 03/31/2020 0933   CL 99 03/31/2020 0933   CO2 25 03/31/2020 0933   BUN 18 03/31/2020 0933   CREATININE 1.13 (H) 03/31/2020 0933   CREATININE 1.44 (H) 01/30/2019 0941  Component Value Date/Time   CALCIUM 10.1 03/31/2020 0933   ALKPHOS 132 (H) 02/04/2020 1722   AST 17 02/04/2020 1722   ALT 10 02/04/2020 1722   BILITOT <0.2 02/04/2020 1722       Lab Results  Component Value Date   WBC 6.6 02/04/2020   HGB 13.3 02/04/2020   HCT 39.1 02/04/2020   MCV 89 02/04/2020   PLT 224 02/04/2020    Lab Results  Component Value Date   TSH 4.420 02/04/2020     Total time spent on today's visit was 40 minutes, including both face-to-face time and nonface-to-face time.  Time included that spent on review of records (prior notes available to me/labs/imaging if pertinent), discussing treatment and goals, answering patient's questions and coordinating care.  Cc:  [name], DO

## 2020-04-08 ENCOUNTER — Encounter: Payer: Self-pay | Admitting: Neurology

## 2020-04-08 ENCOUNTER — Ambulatory Visit (INDEPENDENT_AMBULATORY_CARE_PROVIDER_SITE_OTHER): Payer: Medicare Other | Admitting: Neurology

## 2020-04-08 ENCOUNTER — Other Ambulatory Visit: Payer: Self-pay

## 2020-04-08 VITALS — BP 111/63 | HR 82 | Ht 61.0 in | Wt 134.0 lb

## 2020-04-08 DIAGNOSIS — G20 Parkinson's disease: Secondary | ICD-10-CM | POA: Diagnosis not present

## 2020-04-08 DIAGNOSIS — R413 Other amnesia: Secondary | ICD-10-CM | POA: Diagnosis not present

## 2020-04-08 MED ORDER — CARBIDOPA-LEVODOPA ER 50-200 MG PO TBCR: 1.0000 | EXTENDED_RELEASE_TABLET | Freq: Every day | ORAL | 1 refills | Status: DC

## 2020-04-08 MED ORDER — CARBIDOPA-LEVODOPA 25-100 MG PO TABS: 1.0000 | ORAL_TABLET | Freq: Three times a day (TID) | ORAL | 1 refills | Status: DC

## 2020-04-08 NOTE — Patient Instructions (Addendum)
1.  NO driving 2.  You have been referred to [facility] for therapy. They will call you directly to schedule an appointment.  Please call [phone] if you do not hear from them.  3.  Take carbidopa/levodopa 25/100 (the yellow pill you have) and make sure to take it THREE times per day at 7am/11am/4pm 4.  ADD carbidopa/levodopa 50/200 at bedtime  4.  You have been referred for a neurocognitive evaluation in our office.   The evaluation has two parts.   . The first part of the evaluation is a clinical interview with the neuropsychologist (Dr. Melvyn Novas or Dr. Nicole Kindred). Please bring someone with you to this appointment if possible, as it is helpful for the doctor to hear from both you and another adult who knows you well.   . The second part of the evaluation is testing with the doctor's technician Hinton Dyer or Maudie Mercury). The testing includes a variety of tasks- mostly question-and-answer, some paper-and-pencil. There is nothing you need to do to prepare for this appointment, but having a good night's sleep prior to the testing, taking medications as you normally would, and bringing eyeglasses and hearing aids (if you wear them), is advised. Please make sure that you wear a mask to the appointment.  Please note: We have to reserve several hours of the neuropsychologist's time and the psychometrician's time for your evaluation appointment. As such, please note that there is a No-Show fee of $100. If you are unable to attend any of your appointments, please contact our office as soon as possible to reschedule.

## 2020-04-10 ENCOUNTER — Telehealth: Payer: Self-pay

## 2020-04-10 NOTE — Telephone Encounter (Signed)
Pharmacy is reaching out to to see if you would like for this Patient to be put on a STATIN? The pharmacy recommends ATORVASTATIN, ROSUVASTATIN, PRAVASTATIN OR LOVASTATIN.

## 2020-04-13 ENCOUNTER — Ambulatory Visit (INDEPENDENT_AMBULATORY_CARE_PROVIDER_SITE_OTHER): Payer: Medicare Other

## 2020-04-13 DIAGNOSIS — I5022 Chronic systolic (congestive) heart failure: Secondary | ICD-10-CM | POA: Diagnosis not present

## 2020-04-13 DIAGNOSIS — Z95810 Presence of automatic (implantable) cardiac defibrillator: Secondary | ICD-10-CM | POA: Diagnosis not present

## 2020-04-14 NOTE — Telephone Encounter (Signed)
Will discuss with patient on next visit. She previously has tried few in the past without success.   [name], DO

## 2020-04-17 ENCOUNTER — Other Ambulatory Visit: Payer: Self-pay

## 2020-04-17 NOTE — Progress Notes (Signed)
EPIC Encounter for ICM Monitoring  Patient Name: Veronica Shaw is a 69 y.o. female Date: 04/17/2020 Primary Care Physican: [name], DO Primary Cardiologist:Cooper/Weaver PA Electrophysiologist: Lovena Le 9/17/2021OfficeWeight: 135lbs  Spoke with patient and reports feeling well at this time.  Denies fluid symptoms.    OptiVolThoracic impedancenormal.  Prescribed:Furosemide40 mgTake 0.5 tablets (20 mg total) by moutheverydayas needed for fluid (For swelling).   Labs: 03/31/2020 Creatinine 1.13, BUN 18, Potassium 3.8, Sodium 141, GFR 49-57 02/04/2020 Creatinine1.33, BUN21, Potassium4.8, [id number], BBU03-70 11/01/2019 Creatinine1.13, BUN19, Potassium4.2, Sodium143, [id number] A complete set of results can be found in Results Review.  Recommendations: No changes and encouraged to call if experiencing any fluid symptoms.  Follow-up plan: ICM clinic phone appointment on10/18/2021. 91 day device clinic remote transmission10/28/2021.   EP/Cardiology Office Visits:Recalls 4/2/2022with Dr. Burt Knack and 02/05/2021 for [name].   Copy of ICM check sent to Dr.Taylor.  3 month ICM trend: 04/13/2020    1 Year ICM trend:       [name], RN 04/17/2020 10:00 AM

## 2020-04-19 MED ORDER — POTASSIUM CHLORIDE CRYS ER 10 MEQ PO TBCR: 10.0000 meq | EXTENDED_RELEASE_TABLET | ORAL | 2 refills | Status: DC | PRN

## 2020-04-20 ENCOUNTER — Other Ambulatory Visit: Payer: Self-pay | Admitting: *Deleted

## 2020-05-04 ENCOUNTER — Other Ambulatory Visit: Payer: Self-pay | Admitting: *Deleted

## 2020-05-04 ENCOUNTER — Ambulatory Visit: Payer: Medicare Other | Admitting: Physical Therapy

## 2020-05-05 MED ORDER — METFORMIN HCL 500 MG PO TABS
1000.0000 mg | ORAL_TABLET | Freq: Two times a day (BID) | ORAL | 1 refills | Status: DC
Start: 2020-05-05 — End: 2020-06-24

## 2020-05-06 ENCOUNTER — Other Ambulatory Visit: Payer: Self-pay

## 2020-05-06 ENCOUNTER — Ambulatory Visit: Payer: Medicare Other | Attending: Neurology | Admitting: Physical Therapy

## 2020-05-06 DIAGNOSIS — Z9181 History of falling: Secondary | ICD-10-CM | POA: Diagnosis not present

## 2020-05-06 DIAGNOSIS — R2689 Other abnormalities of gait and mobility: Secondary | ICD-10-CM | POA: Diagnosis not present

## 2020-05-06 DIAGNOSIS — R29818 Other symptoms and signs involving the nervous system: Secondary | ICD-10-CM | POA: Diagnosis not present

## 2020-05-06 DIAGNOSIS — R2681 Unsteadiness on feet: Secondary | ICD-10-CM

## 2020-05-06 DIAGNOSIS — M6281 Muscle weakness (generalized): Secondary | ICD-10-CM | POA: Insufficient documentation

## 2020-05-06 NOTE — Therapy (Addendum)
[facility] [address] [place], [place], [address] Phone: [phone]   Fax:  [phone]  Physical Therapy Evaluation  Patient Details  Name: Veronica Shaw MRN: [id number] Date of Birth: November 22, 1950 Referring Provider (PT): Alonza Bogus, DO   Encounter Date: 05/06/2020   PT End of Session - 05/06/20 1354    Visit Number 1    Number of Visits 17    Date for PT Re-Evaluation 08/04/20   60 day POC, 90 day cert   Authorization Type Medicare    PT Start Time 0850    PT Stop Time 0928    PT Time Calculation (min) 38 min    Equipment Utilized During Treatment Gait belt    Activity Tolerance Patient tolerated treatment well    Behavior During Therapy [facility] for tasks assessed/performed           Past Medical History:  Diagnosis Date  . Asthma   . Chronic systolic CHF (congestive heart failure) ([place])    a. cMRI 4/15: EF 34% and findings - c/w NICM, normal RV size and function (RVEF 61%), Mild MR // b. Echo 2/15:  EF 30-35%, diff HK, ant-sept AK, Gr 2 DD, mild MR, trivial TR  //  c. Echo 5/17: EF 20-25%, severe diffuse HK, marked systolic dyssynchrony, grade 1 diastolic dysfunction, mild MR  //  d. RHC 5/17: Fick CO 2.9, RVSP 19, PASP 15, PW mean 2, low filing pressures and preserved CO   . Diabetes mellitus   . Flu 10/17/2017  . Gastritis   . History of echocardiogram    Echo 6/18: EF 30-35, diffuse HK, grade 1 diastolic dysfunction, trivial MR, mild LAE, mild TR, no pericardial effusion  . History of nuclear stress test    Myoview 5/18: EF 49, no ischemia, inferoseptal defect c/w LBBB artifact (intermediate risk due to EF < 50).  [name] Kitchen HTN (hypertension)   . Hyperlipidemia   . NICM (nonischemic cardiomyopathy) ([place])    a. Nuclear 5/13: Normal stress nuclear study. LV Ejection Fraction: 58%  //  b. LHC 10/14: Minor luminal irregularity in prox LAD, EF 35%   . Parkinson disease ([place])   . Plantar fasciitis   . Sleep apnea     was retested and no longer had it and so d/c CPAP  . Urticaria     Past Surgical History:  Procedure Laterality Date  . BREAST EXCISIONAL BIOPSY Left 1970   benign cyst  . BREAST SURGERY Left   . BUNIONECTOMY    . CARDIAC CATHETERIZATION N/A 12/16/2015   Procedure: Right Heart Cath;  Surgeon: Sherren Mocha, MD;  Location: [facility] CV LAB;  Service: Cardiovascular;  Laterality: N/A;  . CHOLECYSTECTOMY    . eye lid surgery Bilateral 05/09/2019  . IMPLANTABLE CARDIOVERTER DEFIBRILLATOR IMPLANT  11-25-13   MDT dual chamber ICD implanted by [name] for primary prevention  . IMPLANTABLE CARDIOVERTER DEFIBRILLATOR IMPLANT N/A 11/25/2013   Procedure: IMPLANTABLE CARDIOVERTER DEFIBRILLATOR IMPLANT;  Surgeon: [name], MD;  Location: [facility] CATH LAB;  Service: Cardiovascular;  Laterality: N/A;  . LEFT AND RIGHT HEART CATHETERIZATION WITH CORONARY ANGIOGRAM N/A 05/31/2013   Procedure: LEFT AND RIGHT HEART CATHETERIZATION WITH CORONARY ANGIOGRAM;  Surgeon: [name], MD;  Location: [facility] CATH LAB;  Service: Cardiovascular;  Laterality: N/A;  . TONSILLECTOMY    . TUBAL LIGATION      There were no vitals filed for this visit.    Subjective Assessment - 05/06/20 0786  Subjective Has been having a lot of problems with her head and her neck. When she gets up and loses her balance, tendency to lose it to the right side. Reports that she has dizziness at night - usually when she gets up during the middle of the night to go to the bathroom, feels more wobbly. Has had multiple falls - at least 2 falls a week. Usually going from the bedroom to the bathroom when she is turning with no AD. Has a walker at home and isn't using as much as she should. Feeling more off balance.  When going to her daughter's house who has 10 steps, has a really tough time going up.    Pertinent History PD, chronic systolic CHF, diabetes, HTN, HLD    Limitations Walking    Patient Stated Goals wants to walk better     Currently in Pain? Yes   "i have arthritis all over my body"   Pain Score 5     Effect of Pain on Daily Activities will not formally treat pain, but will address throughout functional activity and exercise.              [facility] PT Assessment - 05/06/20 0859      Assessment   Medical Diagnosis PD    Referring Provider (PT) Wells Guiles Tat, DO    Onset Date/Surgical Date 04/08/20   date of referral   Hand Dominance Right    Prior Therapy previous PT at this location in 2018      Precautions   Precautions Fall    Precaution Comments no driving per dr. tat      Balance Screen   Has the patient fallen in the past 6 months Yes    How many times? --   at least 2 falls a week   Has the patient had a decrease in activity level because of a fear of falling?  Yes    Is the patient reluctant to leave their home because of a fear of falling?  Yes      [place] residence    Living Arrangements Spouse/significant other    Available Help at Discharge Family   family in the area   Type of [employer] Access Level entry    [place] - single Shaw;Walker - standard;Shower seat;Grab bars - toilet;Grab bars - tub/shower;Bedside commode   SPC with 3 prong tip   Additional Comments grandchildren and children do the grocery shopping, pt still doing her own cleaning around the house  - takes her time. has a commode, does not like to use it      Prior Function   Level of Independence Independent with basic ADLs    Leisure "nothing"      Sensation   Light Touch Appears Intact    Additional Comments has numbness and tingling in feet and hands - reports legs cramp at night      Coordination   Gross Motor Movements are Fluid and Coordinated Yes      ROM / Strength   AROM / PROM / Strength Strength      Strength   Strength Assessment Site Hip;Knee;Ankle    Right/Left Hip Right;Left    Right Hip Flexion 4/5    Left Hip  Flexion 4/5    Right/Left Knee Right;Left    Right Knee Flexion 4+/5    Right Knee Extension  4+/5    Left Knee Flexion 4+/5    Left Knee Extension 4+/5    Right/Left Ankle Left;Right    Right Ankle Dorsiflexion 4+/5    Left Ankle Dorsiflexion 5/5      Transfers   Transfers Sit to Stand;Stand to Sit    Sit to Stand 4: Min guard;Without upper extremity assist;From chair/3-in-1    Five time sit to stand comments  27.22 seconds without UE support - 2 episodes of posterior bracing against chair    Stand to Sit 5: Supervision;Without upper extremity assist;To chair/3-in-1      Ambulation/Gait   Ambulation/Gait Yes    Assistive device Straight cane   with 3 prong tip   Ambulation Surface Level;Indoor    Gait velocity 21.22 seconds = 1.54 ft/sec    Gait Comments uses cane in R hand      Standardized Balance Assessment   Standardized Balance Assessment Timed Up and Go Test;Berg Balance Test      Berg Balance Test   Sit to Stand Able to stand without using hands and stabilize independently    Standing Unsupported Able to stand safely 2 minutes    Sitting with Back Unsupported but Feet Supported on Floor or Stool Able to sit safely and securely 2 minutes    Stand to Sit Sits safely with minimal use of hands    Transfers Able to transfer safely, definite need of hands    Standing Unsupported with Eyes Closed Able to stand 10 seconds safely    Standing Unsupported with Feet Together Able to place feet together independently and stand 1 minute safely    From Standing, Reach Forward with Outstretched Arm Can reach forward >5 cm safely (2")    From Standing Position, Pick up Object from Floor Able to pick up shoe safely and easily    From Standing Position, Turn to Look Behind Over each Shoulder Looks behind one side only/other side shows less weight shift    Turn 360 Degrees Able to turn 360 degrees safely but slowly   5.62 both directions   Standing Unsupported, Alternately Place Feet on  Step/Stool Able to complete 4 steps without aid or supervision    Standing Unsupported, One Foot in Front Able to plae foot ahead of the other independently and hold 30 seconds    Standing on One Leg Tries to lift leg/unable to hold 3 seconds but remains standing independently    Total Score 44    Berg comment: 44/56      Timed Up and Go Test   Normal TUG (seconds) 19.08    TUG Comments with SPC with 3 prong tip                      Objective measurements completed on examination: See above findings.               PT Education - 05/06/20 1354    Education Details clinical findings, POC    Person(s) Educated Patient    Methods Explanation    Comprehension Verbalized understanding              PT Short Term Goals - 05/08/20 0817      PT SHORT TERM GOAL #1   Title Pt will be independent with initial HEP in order to build upon functional gains made in therapy. ALL STGS DUE 06/05/20    Time 4    Period Weeks    Status New  Target Date 06/05/20      PT SHORT TERM GOAL #2   Title Pt will improve gait speed to at least 1.8 ft/sec with RW vs. straight cane with 3 prong tip in order to demo decr fall risk.    Baseline 1.54 ft/sec    Time 4    Period Weeks    Status New      PT SHORT TERM GOAL #3   Title Pt will decr TUG score to 16.5 seconds or less with SPC with 3 prong tip in order to demo decr fall risk.    Baseline 19.08 seconds    Time 4    Period Weeks    Status New      PT SHORT TERM GOAL #4   Title Pt will perform 5x sit <> stand in 24 seconds or less with no UE support and no episodes of posterior bracing against chair.    Baseline 27.22 seconds    Time 4    Period Weeks    Status New      PT SHORT TERM GOAL #5   Title Pt will verbalize understanding of fall prevention in the home.    Time 4    Period Weeks    Status New                PT Long Term Goals - 05/08/20 0960      PT LONG TERM GOAL #1   Title Pt will be  independent with final HEP in order to build upon functional gains made in therapy. ALL LTGS DUE 07/03/20    Time 8    Period Weeks    Status New    Target Date 07/03/20      PT LONG TERM GOAL #2   Title Pt will improve gait speed to at least 2.2 ft/sec with RW vs. straight cane with 3 prong tip in order to demo decr fall risk and improved gait efficiency    Baseline 1.54 ft/sec    Time 8    Period Weeks    Status New      PT LONG TERM GOAL #3   Title Pt will perform 5x sit <> stand in 24 seconds or less with no UE support and no episodes of posterior bracing against chair.    Baseline 27.22 seconds    Time 8    Period Weeks    Status New      PT LONG TERM GOAL #4   Title Pt will improve BERG score to at least a 50/56 in order to demo decr fall risk.    Baseline 44/56    Time 8    Period Weeks    Status New      PT LONG TERM GOAL #5   Title Pt will decr TUG score to 14 seconds or less with SPC with 3 prong tip in order to demo decr fall risk.    Baseline 19.08 seconds    Time 8    Period Weeks    Status New               05/08/20 0830  Plan  Clinical Impression Statement Patient is a 69 year old female referred to Neuro OPPT for PD and hx of falls. Pt's falls primarily happen when she is performing turns without any AD. Pt's PMH is significant for: PD, chronic systolic CHF, diabetes, HTN, HLD. The following deficits were present during the exam:  gait abnormalities, decr endurance, impaired  balance, decr strength, bradykinesia. Based on BERG, gait speed, 5x sit <> stand, and TUG pt is at a high risk for falls. Pt would benefit from skilled PT to address these impairments and functional limitations to maximize functional mobility independence  Personal Factors and Comorbidities Comorbidity 3+;Past/Current Experience;Time since onset of injury/illness/exacerbation  Comorbidities PD, chronic systolic CHF, diabetes, HTN, HLD  Examination-Activity Limitations Locomotion  Level;Transfers;Stairs  Examination-Participation Restrictions Community Activity;Shop  Pt will benefit from skilled therapeutic intervention in order to improve on the following deficits Abnormal gait;Decreased balance;Decreased activity tolerance;Decreased strength;Difficulty walking;Impaired sensation;Decreased endurance  Stability/Clinical Decision Making Stable/Uncomplicated  Clinical Decision Making Low  Rehab Potential Good  PT Frequency 2x / week  PT Duration 8 weeks  PT Treatment/Interventions ADLs/Self Care Home Management;DME Instruction;Gait training;Stair training;Functional mobility training;Therapeutic activities;Therapeutic exercise;Balance training;Neuromuscular re-education;Patient/family education;Vestibular;Energy conservation  PT Next Visit Plan initial HEP - sit <> stands, seated PWR. standing balance. gait training with SPC/RW, practice turns with gait. stair training for pt to be able to enter/exit daughter's home (has 10 steps)  Consulted and Agree with Plan of Care Patient        Patient will benefit from skilled therapeutic intervention in order to improve the following deficits and impairments:     Visit Diagnosis: History of falling  Unsteadiness on feet  Muscle weakness (generalized)  Other abnormalities of gait and mobility  Other symptoms and signs involving the nervous system     Problem List Patient Active Problem List   Diagnosis Date Noted  . Chronic rhinitis 12/18/2019  . Chronic respiratory failure with hypoxia ([place]) 12/18/2019  . Back pain 09/18/2019  . Post-nasal drip 06/06/2019  . Hyperlipidemia associated with type 2 diabetes mellitus ([place]) 03/05/2019  . Contact dermatitis 03/05/2019  . Cervical disc disorder with radiculopathy of cervical region 09/24/2018  . Leg cramping 12/03/2017  . Cognitive impairment 10/19/2017  . Syncope   . Osteopenia 07/10/2015  . Mild persistent asthma in adult without complication 57/26/2035  . NICM  (nonischemic cardiomyopathy) ([place]) 04/23/2014  . Arthritis or polyarthritis, rheumatoid ([place]) 03/12/2014  . ICD (implantable cardioverter-defibrillator), dual, in situ 03/06/2014  . Thoracic or lumbosacral neuritis or radiculitis, unspecified 07/03/2013  . Chronic systolic heart failure ([place]) 05/31/2013  . Parkinson disease ([place]) 05/21/2013  . Major depression 03/31/2013  . Vertigo 03/03/2013  . GERD (gastroesophageal reflux disease) 09/27/2012  . Plantar fasciitis, bilateral 06/15/2012  . 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 Diabetic peripheral neuropathy ([place]) 12/09/2011  . Hyperlipidemia 12/09/2011  . Chronic urticaria 05/27/2011  . 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 05/20/2011  . ROSACEA 05/29/2009  . ACHILLES BURSITIS OR TENDINITIS 03/27/2009  . Type II diabetes mellitus with complication ([place]) 59/74/1638  . Essential hypertension 09/28/2006    Lillia Pauls, DPT 05/06/2020, 1:55 PM  [facility] [address] [place], [place], [address] Phone: [phone]   Fax:  [phone]  Name: Veronica Shaw MRN: [id number] Date of Birth: [birth year]

## 2020-05-07 DIAGNOSIS — Z23 Encounter for immunization: Secondary | ICD-10-CM | POA: Diagnosis not present

## 2020-05-08 NOTE — Addendum Note (Signed)
Addended by: Arliss Journey on: 05/08/2020 08:37 AM   Modules accepted: Orders

## 2020-05-13 ENCOUNTER — Encounter: Payer: Medicare Other | Admitting: Psychology

## 2020-05-15 ENCOUNTER — Other Ambulatory Visit: Payer: Self-pay

## 2020-05-15 ENCOUNTER — Ambulatory Visit: Payer: Medicare Other | Admitting: Physical Therapy

## 2020-05-15 DIAGNOSIS — M6281 Muscle weakness (generalized): Secondary | ICD-10-CM | POA: Diagnosis not present

## 2020-05-15 DIAGNOSIS — R2689 Other abnormalities of gait and mobility: Secondary | ICD-10-CM

## 2020-05-15 DIAGNOSIS — R2681 Unsteadiness on feet: Secondary | ICD-10-CM

## 2020-05-15 DIAGNOSIS — R29818 Other symptoms and signs involving the nervous system: Secondary | ICD-10-CM | POA: Diagnosis not present

## 2020-05-15 DIAGNOSIS — Z9181 History of falling: Secondary | ICD-10-CM | POA: Diagnosis not present

## 2020-05-15 NOTE — Patient Instructions (Addendum)
Sit to Stand Transfers:  1. Scoot out to the edge of the chair 2. Place your feet flat on the floor, shoulder width apart.  Make sure your feet are tucked just under your knees. 3. Lean forward (nose over toes) with momentum, and stand up tall with your best posture.  If you need to use your arms, use them as a quick boost up to stand. 4. If you are in a low or soft chair, you can lean back and then forward up to stand, in order to get more momentum. 5. Once you are standing, make sure you are looking ahead and standing tall.  To sit down:  1. Back up until you feel the chair behind your legs. 2. Bend at you hips, reaching  Back for you chair, if needed, then slowly squat to sit down on your chair.   For going up stairs:  Lead with your RIGHT foot going up the steps-use Right rail  Lead with your LEFT foot going down the steps-use Left rail

## 2020-05-15 NOTE — Therapy (Signed)
[facility] [address] [place], [place], [address] Phone: [phone]   Fax:  [phone]  Physical Therapy Treatment  Patient Details  Name: Veronica Shaw MRN: [id number] Date of Birth: 1950/08/21 Referring Provider (PT): Alonza Bogus, DO   Encounter Date: 05/15/2020   PT End of Session - 05/15/20 0849    Visit Number 2    Number of Visits 17    Date for PT Re-Evaluation 08/04/20   60 day POC, 90 day cert   Authorization Type Medicare    Progress Note Due on Visit 10    PT Start Time 0843    PT Stop Time 0928    PT Time Calculation (min) 45 min    Equipment Utilized During Treatment Gait belt    Activity Tolerance Patient tolerated treatment well    Behavior During Therapy WFL for tasks assessed/performed           Past Medical History:  Diagnosis Date   Asthma    Chronic systolic CHF (congestive heart failure) ([place])    a. cMRI 4/15: EF 34% and findings - c/w NICM, normal RV size and function (RVEF 61%), Mild MR // b. Echo 2/15:  EF 30-35%, diff HK, ant-sept AK, Gr 2 DD, mild MR, trivial TR  //  c. Echo 5/17: EF 20-25%, severe diffuse HK, marked systolic dyssynchrony, grade 1 diastolic dysfunction, mild MR  //  d. [place] 5/17: Fick CO 2.9, RVSP 19, PASP 15, PW mean 2, low filing pressures and preserved CO    Diabetes mellitus    Flu 10/17/2017   Gastritis    History of echocardiogram    Echo 6/18: EF 30-35, diffuse HK, grade 1 diastolic dysfunction, trivial MR, mild LAE, mild TR, no pericardial effusion   History of nuclear stress test    Myoview 5/18: EF 49, no ischemia, inferoseptal defect c/w LBBB artifact (intermediate risk due to EF < 50).   HTN (hypertension)    Hyperlipidemia    NICM (nonischemic cardiomyopathy) ([place])    a. Nuclear 5/13: Normal stress nuclear study. LV Ejection Fraction: 58%  //  b. LHC 10/14: Minor luminal irregularity in prox LAD, EF 35%    Parkinson disease (H[facility])     Plantar fasciitis    Sleep apnea    was retested and no longer had it and so d/c CPAP   Urticaria     Past Surgical History:  Procedure Laterality Date   BREAST EXCISIONAL BIOPSY Left 1970   benign cyst   BREAST SURGERY Left    BUNIONECTOMY     CARDIAC CATHETERIZATION N/A 12/16/2015   Procedure: Right Heart Cath;  Surgeon: Sherren Mocha, MD;  Location: [facility];  Service: Cardiovascular;  Laterality: N/A;   CHOLECYSTECTOMY     eye lid surgery Bilateral 05/09/2019   IMPLANTABLE CARDIOVERTER DEFIBRILLATOR IMPLANT  11-25-13   MDT dual chamber ICD implanted by [name] for primary prevention   IMPLANTABLE CARDIOVERTER DEFIBRILLATOR IMPLANT N/A 11/25/2013   Procedure: IMPLANTABLE CARDIOVERTER DEFIBRILLATOR IMPLANT;  Surgeon: [name], MD;  Location: [facility] C[facility];  Service: Cardiovascular;  Laterality: N/A;   LEFT AND RIGHT HEART CATHETERIZATION WITH CORONARY ANGIOGRAM N/A 05/31/2013   Procedure: LEFT AND RIGHT HEART CATHETERIZATION WITH CORONARY ANGIOGRAM;  Surgeon: [name], MD;  Location: [facility] CATH LAB;  Service: Cardiovascular;  Laterality: N/A;   TONSILLECTOMY     TUBAL LIGATION      There were no vitals filed for this visit.  Subjective Assessment - 05/15/20 0837    Subjective Woke up with some pain in legs; think that I overdid it watching my great-grandson.  Had a fall, going down the steps, down 1-2 steps.  Had help with granddaughter to help me up.    Pertinent History PD, chronic systolic CHF, diabetes, HTN, HLD    Limitations Walking    Patient Stated Goals wants to walk better    Currently in Pain? Yes    Pain Score 6     Pain Location Leg    Pain Orientation Left;Right    Pain Descriptors / Indicators Aching    Pain Type Acute pain    Pain Onset In the past 7 days    Aggravating Factors  unsure    Pain Relieving Factors unsure                             [facility] Adult PT Treatment/Exercise - 05/15/20 0001       Transfers   Transfers Sit to Stand;Stand to Sit    Sit to Stand 4: Min guard;From chair/3-in-1;With upper extremity assist    Stand to Sit 5: Supervision;To chair/3-in-1;With armrests;With upper extremity assist;Uncontrolled descent    Stand to Sit Details (indicate cue type and reason) Verbal cues for sequencing;Verbal cues for technique    Number of Reps Other reps (comment);1 set   5 reps; then additional 5-10 reps throughout session   Transfer Cueing Cues for correct technique for sit<>stand, including scooting, foot placement, forward lean, and hand placement.  Pt attempts to use hands on rollator, needs cues to push from knees or chair.     Comments Pt tends to have decreased forward lean with sit>stand, needing visual, verbal, tactile cues for increased forward lean      Ambulation/Gait   Ambulation/Gait Yes    Ambulation/Gait Assistance 5: Supervision;4: Min guard    Ambulation/Gait Assistance Details Pt brings in her rollator today; also worked with rolling walker and straight cane with quad tip base with gait today.  Pt appears to have antalgic gait pattern with lean to R using cane; educated pt that she appears more safe using walker.    Ambulation Distance (Feet) 115 Feet   x 3, with rollator, then RW; 60 ft with cane   Assistive device Rolling walker;Rollator    Gait Pattern Step-through pattern;Decreased step length - right;Decreased step length - left;Antalgic;Narrow base of support    Ambulation Surface Level;Indoor    Stairs Yes    Stairs Assistance 4: Min assist;4: Min guard    Stairs Assistance Details (indicate cue type and reason) Initially, pt leads up with LLE (with knee flexed, almost not making it to the next step). She leads down with RLE and appears unsteady with 1 rail (as she would have at daughter's home).  Educated pt to go up iwth "good" (right) and down with "bad (left leg), then trialed stair negotiation that way, with pt and PT noting improved stability.   Remaining trials performed with RLE leading up steps, LLE leading down steps.    Stair Management Technique One rail Left;Step to pattern;Forwards    Number of Stairs 4   x 4 reps   Height of Stairs 6    Pre-Gait Activities Cues for staying close to walker, cues for step length and widened BOS.    Gait Comments Gait with RW with figure-8 turns around cones, cues to widen BOS; performed x 2.  Then worked on marching turn in tighter space, but pt appears unsteady.  Will need to work more on turns.      Posture/Postural Control   Posture/Postural Control Postural limitations    Postural Limitations Rounded Shoulders;Forward head;Posterior pelvic tilt   Pt tends to sit in chair with posterior pelvi tilt/post lean   Posture Comments Seated forward lean>upright posture, with PT providing tactile cues for increased forward trunk lean, and tactile cues for upright posture thorugh shoulders (she tends to lean posteriorly)  Performed x 10 reps.                  PT Education - 05/15/20 1044    Education Details Sit<>stand technique, stair negotiation technique    Person(s) Educated Patient    Methods Explanation;Demonstration;Handout    Comprehension Verbalized understanding;Verbal cues required;Returned demonstration            PT Short Term Goals - 05/08/20 0817      PT SHORT TERM GOAL #1   Title Pt will be independent with initial HEP in order to build upon functional gains made in therapy. ALL STGS DUE 06/05/20    Time 4    Period Weeks    Status New    Target Date 06/05/20      PT SHORT TERM GOAL #2   Title Pt will improve gait speed to at least 1.8 ft/sec with RW vs. straight cane with 3 prong tip in order to demo decr fall risk.    Baseline 1.54 ft/sec    Time 4    Period Weeks    Status New      PT SHORT TERM GOAL #3   Title Pt will decr TUG score to 16.5 seconds or less with SPC with 3 prong tip in order to demo decr fall risk.    Baseline 19.08 seconds    Time 4     Period Weeks    Status New      PT SHORT TERM GOAL #4   Title Pt will perform 5x sit <> stand in 24 seconds or less with no UE support and no episodes of posterior bracing against chair.    Baseline 27.22 seconds    Time 4    Period Weeks    Status New      PT SHORT TERM GOAL #5   Title Pt will verbalize understanding of fall prevention in the home.    Time 4    Period Weeks    Status New             PT Long Term Goals - 05/08/20 1540      PT LONG TERM GOAL #1   Title Pt will be independent with final HEP in order to build upon functional gains made in therapy. ALL LTGS DUE 07/03/20    Time 8    Period Weeks    Status New    Target Date 07/03/20      PT LONG TERM GOAL #2   Title Pt will improve gait speed to at least 2.2 ft/sec with RW vs. straight cane with 3 prong tip in order to demo decr fall risk and improved gait efficiency    Baseline 1.54 ft/sec    Time 8    Period Weeks    Status New      PT LONG TERM GOAL #3   Title Pt will perform 5x sit <> stand in 24 seconds or less with no UE support and no  episodes of posterior bracing against chair.    Baseline 27.22 seconds    Time 8    Period Weeks    Status New      PT LONG TERM GOAL #4   Title Pt will improve BERG score to at least a 50/56 in order to demo decr fall risk.    Baseline 44/56    Time 8    Period Weeks    Status New      PT LONG TERM GOAL #5   Title Pt will decr TUG score to 14 seconds or less with SPC with 3 prong tip in order to demo decr fall risk.    Baseline 19.08 seconds    Time 8    Period Weeks    Status New                 Plan - 05/15/20 1045    Clinical Impression Statement Focused skilled PT session today on transfer training, gait training, and stair training.  Pt appears very guarded in movement patterns, including transfers and gait.  Educated that [place] or rollator may be better, safer options for gait safety at this time, versus cane, as she has more guarded gait and  more lean to R with cane.  She will continue to benefit from skilled PT to further address balance, gait and functional strength for improved overall mobilitya nd safety with gait.    Personal Factors and Comorbidities Comorbidity 3+;Past/Current Experience;Time since onset of injury/illness/exacerbation    Comorbidities PD, chronic systolic CHF, diabetes, HTN, HLD    Examination-Activity Limitations Locomotion Level;Transfers;Stairs    Examination-Participation Restrictions Community Activity;Shop    Stability/Clinical Decision Making Stable/Uncomplicated    Rehab Potential Good    PT Frequency 2x / week    PT Duration 8 weeks    PT Treatment/Interventions ADLs/Self Care Home Management;DME Instruction;Gait training;Stair training;Functional mobility training;Therapeutic activities;Therapeutic exercise;Balance training;Neuromuscular re-education;Patient/family education;Vestibular;Energy conservation    PT Next Visit Plan Review sit<>stand technique and stair negotiation.  Gait with RW or SPC-especially work on turns with gait.  Continue to work on stairs; initiate seated PWR! Moves.    Consulted and Agree with Plan of Care Patient           Patient will benefit from skilled therapeutic intervention in order to improve the following deficits and impairments:  Abnormal gait, Decreased balance, Decreased activity tolerance, Decreased strength, Difficulty walking, Impaired sensation, Decreased endurance  Visit Diagnosis: Other abnormalities of gait and mobility  Unsteadiness on feet     Problem List Patient Active Problem List   Diagnosis Date Noted   Chronic rhinitis 12/18/2019   Chronic respiratory failure with hypoxia ([place]) 12/18/2019   Back pain 09/18/2019   Post-nasal drip 06/06/2019   Hyperlipidemia associated with type 2 diabetes mellitus ([place]) 03/05/2019   Contact dermatitis 03/05/2019   Cervical disc disorder with radiculopathy of cervical region 09/24/2018   Leg  cramping 12/03/2017   Cognitive impairment 10/19/2017   Syncope    Osteopenia 07/10/2015   Mild persistent asthma in adult without complication 82/95/6213   NICM (nonischemic cardiomyopathy) ([place]) 04/23/2014   Arthritis or polyarthritis, rheumatoid ([place]) 03/12/2014   ICD (implantable cardioverter-defibrillator), dual, in situ 03/06/2014   Thoracic or lumbosacral neuritis or radiculitis, unspecified 08/65/7846   Chronic systolic heart failure ([place]) 05/31/2013   Parkinson disease ([place]) 05/21/2013   Major depression 03/31/2013   Vertigo 03/03/2013   GERD (gastroesophageal reflux disease) 09/27/2012   Plantar fasciitis, bilateral 06/15/2012   Spondyloarthropathy 04/04/2012  Diabetic peripheral neuropathy ([place]) 12/09/2011   Hyperlipidemia 12/09/2011   Chronic urticaria 05/27/2011   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 05/15/2020, 10:50 AM Frazier Butt., PT  [facility] [address] [place], [place], [address] Phone: [phone]   Fax:  [phone]  Name: Veronica Shaw MRN: [id number] Date of Birth: [birth year]

## 2020-05-18 ENCOUNTER — Ambulatory Visit (INDEPENDENT_AMBULATORY_CARE_PROVIDER_SITE_OTHER): Payer: Medicare Other

## 2020-05-18 DIAGNOSIS — Z95810 Presence of automatic (implantable) cardiac defibrillator: Secondary | ICD-10-CM | POA: Diagnosis not present

## 2020-05-18 DIAGNOSIS — I5022 Chronic systolic (congestive) heart failure: Secondary | ICD-10-CM | POA: Diagnosis not present

## 2020-05-18 DIAGNOSIS — N1831 Chronic kidney disease, stage 3a: Secondary | ICD-10-CM | POA: Diagnosis not present

## 2020-05-20 ENCOUNTER — Encounter: Payer: Medicare Other | Admitting: Psychology

## 2020-05-20 NOTE — Progress Notes (Signed)
EPIC Encounter for ICM Monitoring  Patient Name: Veronica Shaw is a 69 y.o. female Date: 05/20/2020 Primary Care Physican: [name], DO Primary Cardiologist:Cooper/Weaver PA Electrophysiologist: Lovena Le 9/17/2021OfficeWeight: 135lbs  Transmission reviewed.   OptiVolThoracic impedancenormal.  Prescribed:Furosemide40 mgTake 0.5 tablets (20 mg total) by moutheverydayas needed for fluid (For swelling).   Labs: 03/31/2020 Creatinine 1.13, BUN 18, Potassium 3.8, Sodium 141, GFR 49-57 02/04/2020 Creatinine1.33, BUN21, Potassium4.8, PTYYPE961, TEI35-39 11/01/2019 Creatinine1.13, BUN19, Potassium4.2, Sodium143, NSQ58-34 A complete set of results can be found in Results Review.  Recommendations: No changes.  Follow-up plan: ICM clinic phone appointment on11/22/2021. 91 day device clinic remote transmission10/28/2021.   EP/Cardiology Office Visits:Recalls 4/2/2022with Dr. Burt Knack and 02/05/2021 for [name].   Copy of ICM check sent to Dr.Taylor.    1 Year ICM trend:       [name], RN 05/20/2020 12:44 PM

## 2020-05-21 ENCOUNTER — Other Ambulatory Visit: Payer: Self-pay

## 2020-05-21 ENCOUNTER — Ambulatory Visit: Payer: Medicare Other | Admitting: Physical Therapy

## 2020-05-21 DIAGNOSIS — M6281 Muscle weakness (generalized): Secondary | ICD-10-CM

## 2020-05-21 DIAGNOSIS — R29818 Other symptoms and signs involving the nervous system: Secondary | ICD-10-CM

## 2020-05-21 DIAGNOSIS — R2689 Other abnormalities of gait and mobility: Secondary | ICD-10-CM | POA: Diagnosis not present

## 2020-05-21 DIAGNOSIS — R2681 Unsteadiness on feet: Secondary | ICD-10-CM | POA: Diagnosis not present

## 2020-05-21 DIAGNOSIS — Z9181 History of falling: Secondary | ICD-10-CM | POA: Diagnosis not present

## 2020-05-21 NOTE — Patient Instructions (Signed)
Access Code: [email or web address] URL: [email or web address] Date: 05/21/2020 Prepared by: Mady Haagensen  Exercises Seated March - 1-2 x daily - 5 x weekly - 1 sets - 10 reps Seated Long Arc Quad - 1-2 x daily - 5 x weekly - 1 sets - 10 reps Seated Ankle Pumps - 1-2 x daily - 5 x weekly - 1 sets - 10 reps

## 2020-05-21 NOTE — Therapy (Signed)
[facility] [address] [place], [place], [address] Phone: [phone]   Fax:  [phone]  Physical Therapy Treatment  Patient Details  Name: Veronica Shaw MRN: [id number] Date of Birth: 01-07-1951 Referring Provider (PT): Alonza Bogus, DO   Encounter Date: 05/21/2020   PT End of Session - 05/21/20 0807    Visit Number 3    Number of Visits 17    Date for PT Re-Evaluation 08/04/20   60 day POC, 90 day cert   Authorization Type Medicare    Progress Note Due on Visit 10    PT Start Time 0805    PT Stop Time 0846    PT Time Calculation (min) 41 min    Equipment Utilized During Treatment Gait belt    Activity Tolerance Patient tolerated treatment well   Pt reports less pain, 3-4/10 at end of session   Behavior During Therapy [facility] for tasks assessed/performed           Past Medical History:  Diagnosis Date  . Asthma   . Chronic systolic CHF (congestive heart failure) ([place])    a. cMRI 4/15: EF 34% and findings - c/w NICM, normal RV size and function (RVEF 61%), Mild MR // b. Echo 2/15:  EF 30-35%, diff HK, ant-sept AK, Gr 2 DD, mild MR, trivial TR  //  c. Echo 5/17: EF 20-25%, severe diffuse HK, marked systolic dyssynchrony, grade 1 diastolic dysfunction, mild MR  //  d. RHC 5/17: Fick CO 2.9, RVSP 19, PASP 15, PW mean 2, low filing pressures and preserved CO   . Diabetes mellitus   . Flu 10/17/2017  . Gastritis   . History of echocardiogram    Echo 6/18: EF 30-35, diffuse HK, grade 1 diastolic dysfunction, trivial MR, mild LAE, mild TR, no pericardial effusion  . History of nuclear stress test    Myoview 5/18: EF 49, no ischemia, inferoseptal defect c/w LBBB artifact (intermediate risk due to EF < 50).  [name] Kitchen HTN (hypertension)   . Hyperlipidemia   . NICM (nonischemic cardiomyopathy) ([place])    a. Nuclear 5/13: Normal stress nuclear study. LV Ejection Fraction: 58%  //  b. LHC 10/14: Minor luminal irregularity in prox  LAD, EF 35%   . Parkinson disease ([place])   . Plantar fasciitis   . Sleep apnea    was retested and no longer had it and so d/c CPAP  . Urticaria     Past Surgical History:  Procedure Laterality Date  . BREAST EXCISIONAL BIOPSY Left 1970   benign cyst  . BREAST SURGERY Left   . BUNIONECTOMY    . CARDIAC CATHETERIZATION N/A 12/16/2015   Procedure: Right Heart Cath;  Surgeon: Sherren Mocha, MD;  Location: [facility];  Service: Cardiovascular;  Laterality: N/A;  . CHOLECYSTECTOMY    . eye lid surgery Bilateral 05/09/2019  . IMPLANTABLE CARDIOVERTER DEFIBRILLATOR IMPLANT  11-25-13   MDT dual chamber ICD implanted by [name] for primary prevention  . IMPLANTABLE CARDIOVERTER DEFIBRILLATOR IMPLANT N/A 11/25/2013   Procedure: IMPLANTABLE CARDIOVERTER DEFIBRILLATOR IMPLANT;  Surgeon: [name], MD;  Location: [facility] CATH LAB;  Service: Cardiovascular;  Laterality: N/A;  . LEFT AND RIGHT HEART CATHETERIZATION WITH CORONARY ANGIOGRAM N/A 05/31/2013   Procedure: LEFT AND RIGHT HEART CATHETERIZATION WITH CORONARY ANGIOGRAM;  Surgeon: [name], MD;  Location: [facility] CATH LAB;  Service: Cardiovascular;  Laterality: N/A;  . TONSILLECTOMY    . TUBAL LIGATION  There were no vitals filed for this visit.   Subjective Assessment - 05/21/20 0807    Subjective Beginning to get my days and nights mixed up-can't get comfortable to sleep, so I'm tired during the day.    Pertinent History PD, chronic systolic CHF, diabetes, HTN, HLD    Limitations Walking    Patient Stated Goals wants to walk better    Currently in Pain? Yes    Pain Score 7     Pain Location Leg    Pain Orientation Right;Left    Pain Descriptors / Indicators Aching;Dull    Pain Type Acute pain    Pain Onset In the past 7 days    Aggravating Factors  worse at night    Pain Relieving Factors has arthritis medication, but is not taking regularly                             [facility] Adult PT  Treatment/Exercise - 05/21/20 0811      Transfers   Transfers Sit to Stand;Stand to Sit    Sit to Stand 5: Supervision;From bed;From chair/3-in-1    Sit to Stand Details Verbal cues for sequencing;Verbal cues for technique    Sit to Stand Details (indicate cue type and reason) Cues for hand placement and increased forward lean to initiate sit>stand    Stand to Sit 5: Supervision;To chair/3-in-1;With armrests;With upper extremity assist    Stand to Sit Details (indicate cue type and reason) Verbal cues for sequencing;Verbal cues for technique    Number of Reps Other reps (comment);2 sets   5 reps-from mat, then from chair     Exercises   Exercises Knee/Hip      Knee/Hip Exercises: Stretches   Active Hamstring Stretch Right;Left;2 reps;20 seconds      Knee/Hip Exercises: Seated   Long Arc Quad AROM;Strengthening;1 set;10 reps    Other Seated Knee/Hip Exercises Seated ankle pumps x 10 reps    Marching AROM;Strengthening;1 set;10 reps             Neuro Re-education:  Pt performs PWR! Moves in seated position    PWR! Up for improved posture x 10 reps, with visual, verbal, tactile cues for posture, position, technique.  Pt has tendency for posterior lean, cues for upright posture and scapular squeeze.  PWR! Rock for improved weighshifting x 5 reps each side, with visual, verbal cues.  Attempted higher reach, but pt unable due to shoulder pain.   PWR! Twist for improved trunk rotation - attempted x 3 reps each side, pt very limited in trunk rotation and has difficulty moving through hips to increase trunk rotation.   PWR! Step for improved step initiation-single limb step out and in, x 10 reps each side, legs supported at aerobic step.  Cues provided, as noted above.        PT Education - 05/21/20 1027    Education Details Added to HEP; discussed pt's Parkinson's medication routine and rationale for optimal medication (pt is not currently taking nighttime dose of Sinemet CR)      Person(s) Educated Patient    Methods Explanation;Demonstration;Handout    Comprehension Verbalized understanding;Returned demonstration            PT Short Term Goals - 05/08/20 0817      PT SHORT TERM GOAL #1   Title Pt will be independent with initial HEP in order to build upon functional gains made in therapy. ALL STGS DUE  06/05/20    Time 4    Period Weeks    Status New    Target Date 06/05/20      PT SHORT TERM GOAL #2   Title Pt will improve gait speed to at least 1.8 ft/sec with RW vs. straight cane with 3 prong tip in order to demo decr fall risk.    Baseline 1.54 ft/sec    Time 4    Period Weeks    Status New      PT SHORT TERM GOAL #3   Title Pt will decr TUG score to 16.5 seconds or less with SPC with 3 prong tip in order to demo decr fall risk.    Baseline 19.08 seconds    Time 4    Period Weeks    Status New      PT SHORT TERM GOAL #4   Title Pt will perform 5x sit <> stand in 24 seconds or less with no UE support and no episodes of posterior bracing against chair.    Baseline 27.22 seconds    Time 4    Period Weeks    Status New      PT SHORT TERM GOAL #5   Title Pt will verbalize understanding of fall prevention in the home.    Time 4    Period Weeks    Status New             PT Long Term Goals - 05/08/20 4970      PT LONG TERM GOAL #1   Title Pt will be independent with final HEP in order to build upon functional gains made in therapy. ALL LTGS DUE 07/03/20    Time 8    Period Weeks    Status New    Target Date 07/03/20      PT LONG TERM GOAL #2   Title Pt will improve gait speed to at least 2.2 ft/sec with RW vs. straight cane with 3 prong tip in order to demo decr fall risk and improved gait efficiency    Baseline 1.54 ft/sec    Time 8    Period Weeks    Status New      PT LONG TERM GOAL #3   Title Pt will perform 5x sit <> stand in 24 seconds or less with no UE support and no episodes of posterior bracing against chair.     Baseline 27.22 seconds    Time 8    Period Weeks    Status New      PT LONG TERM GOAL #4   Title Pt will improve BERG score to at least a 50/56 in order to demo decr fall risk.    Baseline 44/56    Time 8    Period Weeks    Status New      PT LONG TERM GOAL #5   Title Pt will decr TUG score to 14 seconds or less with SPC with 3 prong tip in order to demo decr fall risk.    Baseline 19.08 seconds    Time 8    Period Weeks    Status New                 Plan - 05/21/20 1030    Clinical Impression Statement Reviewed sit<>stand exercise from last visit, with pt needing cues for technique and ease of movement pattern.  Pt continues to appear guarded with movement patterns, and in discussing Parkinson's medication routine, she reports not  taking her evening Sinemet CR dose.  Advised that pt speak to Dr. Carles Collet about this, especially since she has so much pain at night.  Wtih seated PWR! Moves and gentle lower extremity AROM exercises, pt reports decreased pain at end of session.    Personal Factors and Comorbidities Comorbidity 3+;Past/Current Experience;Time since onset of injury/illness/exacerbation    Comorbidities PD, chronic systolic CHF, diabetes, HTN, HLD    Examination-Activity Limitations Locomotion Level;Transfers;Stairs    Examination-Participation Restrictions Community Activity;Shop    Stability/Clinical Decision Making Stable/Uncomplicated    Rehab Potential Good    PT Frequency 2x / week    PT Duration 8 weeks    PT Treatment/Interventions ADLs/Self Care Home Management;DME Instruction;Gait training;Stair training;Functional mobility training;Therapeutic activities;Therapeutic exercise;Balance training;Neuromuscular re-education;Patient/family education;Vestibular;Energy conservation    PT Next Visit Plan Review stair negotiation; gait training and turning wtih gait; continue with seated PWR! Moves and give as HEP as appropriate; review seated exercises added to HEP;  continue to encourage movement/activity at home.    Consulted and Agree with Plan of Care Patient           Patient will benefit from skilled therapeutic intervention in order to improve the following deficits and impairments:  Abnormal gait, Decreased balance, Decreased activity tolerance, Decreased strength, Difficulty walking, Impaired sensation, Decreased endurance  Visit Diagnosis: Other abnormalities of gait and mobility  Unsteadiness on feet  Muscle weakness (generalized)  Other symptoms and signs involving the nervous system     Problem List Patient Active Problem List   Diagnosis Date Noted  . Chronic rhinitis 12/18/2019  . Chronic respiratory failure with hypoxia ([place]) 12/18/2019  . Back pain 09/18/2019  . Post-nasal drip 06/06/2019  . Hyperlipidemia associated with type 2 diabetes mellitus ([place]) 03/05/2019  . Contact dermatitis 03/05/2019  . Cervical disc disorder with radiculopathy of cervical region 09/24/2018  . Leg cramping 12/03/2017  . Cognitive impairment 10/19/2017  . Syncope   . Osteopenia 07/10/2015  . Mild persistent asthma in adult without complication 03/54/6568  . NICM (nonischemic cardiomyopathy) ([place]) 04/23/2014  . Arthritis or polyarthritis, rheumatoid ([place]) 03/12/2014  . ICD (implantable cardioverter-defibrillator), dual, in situ 03/06/2014  . Thoracic or lumbosacral neuritis or radiculitis, unspecified 07/03/2013  . Chronic systolic heart failure ([place]) 05/31/2013  . Parkinson disease ([place]) 05/21/2013  . Major depression 03/31/2013  . Vertigo 03/03/2013  . GERD (gastroesophageal reflux disease) 09/27/2012  . Plantar fasciitis, bilateral 06/15/2012  . 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 Diabetic peripheral neuropathy ([place]) 12/09/2011  . Hyperlipidemia 12/09/2011  . Chronic urticaria 05/27/2011  . 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 05/20/2011  . ROSACEA 05/29/2009  . ACHILLES BURSITIS OR TENDINITIS 03/27/2009  . Type II diabetes mellitus with complication  ([place]) 12/75/1700  . Essential hypertension 09/28/2006    Rickell Wiehe W. 05/21/2020, 10:36 AM Frazier Butt., PT  [facility] [address] [place], [place], [address] Phone: [phone]   Fax:  [phone]  Name: Michaila Kenney MRN: [id number] Date of Birth: 1951/07/30

## 2020-05-22 ENCOUNTER — Other Ambulatory Visit: Payer: Self-pay | Admitting: *Deleted

## 2020-05-22 NOTE — Patient Outreach (Addendum)
[facility]) Care Management  [place]  05/22/2020   Veronica Shaw 1950-08-06 [id number]  RN Health Coach telephone call to patient.  [employer] compliance verified. Per patient she is weighing daily, monitoring CBG and monitoring blood pressure. Patient is taking medications as prescribed. Patient stated she is having some memory loss but had to cancel her neurological study. Per patient she is under stress with her illnesses and her husband being sick. Patient stated she also has CKD stage 3 and would like additional information on foods to eat. Patient had recent fall with no injuries.  Patient has agreed to follow up outreach calls.  Encounter Medications:  Outpatient Encounter Medications as of 05/22/2020  Medication Sig Note  . albuterol (PROVENTIL HFA;VENTOLIN HFA) 108 (90 Base) MCG/ACT inhaler Inhale 2 puffs into the lungs every 4 (four) hours as needed for wheezing or shortness of breath.   . Biotin 5000 MCG CAPS Take 1 tablet by mouth daily.    . budesonide-formoterol (SYMBICORT) 160-4.5 MCG/ACT inhaler Inhale 2 puffs into the lungs 2 (two) times daily.   . Calcium Carb-Cholecalciferol 600-800 MG-UNIT TABS Take 1 tablet by mouth daily.   . carbidopa-levodopa (SINEMET CR) 50-200 MG tablet Take 1 tablet by mouth at bedtime. (Patient not taking: Reported on 05/21/2020)   . carbidopa-levodopa (SINEMET IR) 25-100 MG tablet Take 1 tablet by mouth 3 (three) times daily.   . famotidine (PEPCID) 20 MG tablet TAKE 1 TABLET TWICE DAILY   . fluticasone (FLONASE SENSIMIST) 27.5 MCG/SPRAY nasal spray Place 2 sprays into the nose daily.   . furosemide (LASIX) 40 MG tablet TAKE 1/2 TABLET EVERY DAY AS NEEDED FOR  FLUID/SWELLING   . gabapentin (NEURONTIN) 100 MG capsule Take 300mg  (3 pills) at night before bed and 100mg  (1 pill) in the morning with breakfast.   . levalbuterol (XOPENEX) 0.63 MG/3ML nebulizer solution 1 vial every 4-6 hours as needed for wheezing and  shortness of breath. 03/31/2020: As needed especially in the winter  . losartan (COZAAR) 25 MG tablet TAKE 1 TABLET EVERY DAY   . meclizine (ANTIVERT) 12.5 MG tablet Take 0.5 tablets (6.25 mg total) by mouth 3 (three) times daily as needed for dizziness.   . metFORMIN (GLUCOPHAGE) 500 MG tablet Take 2 tablets (1,000 mg total) by mouth 2 (two) times daily with a meal.   . methocarbamol (ROBAXIN) 750 MG tablet Take 750 mg by mouth as needed.    . metoprolol succinate (TOPROL-XL) 50 MG 24 hr tablet TAKE 1 TABLET TWICE DAILY   . potassium chloride (KLOR-CON) 10 MEQ tablet Take 1 tablet (10 mEq total) by mouth as needed (take with lasix as needed for swelling).   [name] Kitchen spironolactone (ALDACTONE) 25 MG tablet TAKE 1/2 TABLET EVERY DAY   . Turmeric 500 MG TABS Take 1 tablet by mouth daily.    No facility-administered encounter medications on file as of 05/22/2020.    Functional Status:  In your present state of health, do you have any difficulty performing the following activities: 07/09/2019  Hearing? N  Vision? Y  Difficulty concentrating or making decisions? Y  Walking or climbing stairs? Y  Dressing or bathing? N  Doing errands, shopping? Y  Preparing Food and eating ? N  Using the Toilet? N  In the past six months, have you accidently leaked urine? Y  Do you have problems with loss of bowel control? N  Managing your Medications? N  Managing your Finances? N  Housekeeping or managing your Housekeeping?  N  Some recent data might be hidden    Fall/Depression Screening: Fall Risk  05/22/2020 04/08/2020 03/31/2020  Falls in the past year? 1 1 1   Comment - - -  Number falls in past yr: 1 1 0  Injury with Fall? 0 0 0  Comment - - -  Risk Factor Category  High Risk (2 or more Points) - -  Risk for fall due to : History of fall(s);Impaired balance/gait;Impaired mobility - -  Follow up Falls evaluation completed;Falls prevention discussed - -  Comment - - -   PHQ 2/9 Scores 03/31/2020 09/18/2019  09/17/2019 07/09/2019 04/02/2019 03/05/2019 12/26/2018  PHQ - 2 Score 0 0 0 0 0 0 0  PHQ- 9 Score 3 - - - - - -  Exception Documentation - - - - - - -    Assessment:  Goals Addressed            This Visit's Progress   . (THN)Make and Keep All Appointments       Follow Up Date [id number]   - call to cancel if needed - keep a calendar with prescription refill dates - keep a calendar with appointment dates    Why is this important?   Part of staying healthy is seeing the doctor for follow-up care.  If you forget your appointments, there are some things you can do to stay on track.    Notes:     . (THN)Track and Manage Activity and Exertion       Follow Up Date 07/31/2020   - meet with physical therapist - pace activity allowing for rest    Why is this important?   Exercising is very important when managing your heart failure.  It will help your heart get stronger.    Notes:     . (THN)Track and Manage Fluids and Swelling       Follow Up Date [id number]   - call office if I gain more than 2 pounds in one day or 5 pounds in one week - keep legs up while sitting - track weight in diary - use salt in moderation - watch for swelling in feet, ankles and legs every day - weigh myself daily    Why is this important?   It is important to check your weight daily and watch how much salt and liquids you have.  It will help you to manage your heart failure.    Notes:     . (THN)Track and Manage Symptoms       Follow Up Date [id number]   - develop a rescue plan - follow rescue plan if symptoms flare-up - know when to call the doctor - track symptoms and what helps feel better or worse    Why is this important?   You will be able to handle your symptoms better if you keep track of them.  Making some simple changes to your lifestyle will help.  Eating healthy is one thing you can do to take good care of yourself.    Notes:        Plan:  Patient is currently receiving physical  therapy Patient will not have any falls Patient will not have any admissions for CHF exacerbation Patient will continue to monitor weight, CBG and blood pressure and document RN will follow up within the month of December RN will send update assessment to PCP  [facility] [phone]

## 2020-05-22 NOTE — Patient Instructions (Signed)
Goals Addressed            This Visit's Progress   . (THN)Make and Keep All Appointments       Follow Up Date [id number]   - call to cancel if needed - keep a calendar with prescription refill dates - keep a calendar with appointment dates    Why is this important?   Part of staying healthy is seeing the doctor for follow-up care.  If you forget your appointments, there are some things you can do to stay on track.    Notes:     . (THN)Track and Manage Activity and Exertion       Follow Up Date 07/31/2020   - meet with physical therapist - pace activity allowing for rest    Why is this important?   Exercising is very important when managing your heart failure.  It will help your heart get stronger.    Notes:     . (THN)Track and Manage Fluids and Swelling       Follow Up Date [id number]   - call office if I gain more than 2 pounds in one day or 5 pounds in one week - keep legs up while sitting - track weight in diary - use salt in moderation - watch for swelling in feet, ankles and legs every day - weigh myself daily    Why is this important?   It is important to check your weight daily and watch how much salt and liquids you have.  It will help you to manage your heart failure.    Notes:     . (THN)Track and Manage Symptoms       Follow Up Date [id number]   - develop a rescue plan - follow rescue plan if symptoms flare-up - know when to call the doctor - track symptoms and what helps feel better or worse    Why is this important?   You will be able to handle your symptoms better if you keep track of them.  Making some simple changes to your lifestyle will help.  Eating healthy is one thing you can do to take good care of yourself.    Notes:

## 2020-05-25 DIAGNOSIS — N1831 Chronic kidney disease, stage 3a: Secondary | ICD-10-CM | POA: Diagnosis not present

## 2020-05-25 DIAGNOSIS — I129 Hypertensive chronic kidney disease with stage 1 through stage 4 chronic kidney disease, or unspecified chronic kidney disease: Secondary | ICD-10-CM | POA: Diagnosis not present

## 2020-05-25 DIAGNOSIS — I5022 Chronic systolic (congestive) heart failure: Secondary | ICD-10-CM | POA: Diagnosis not present

## 2020-05-25 DIAGNOSIS — E559 Vitamin D deficiency, unspecified: Secondary | ICD-10-CM | POA: Diagnosis not present

## 2020-05-25 DIAGNOSIS — G20 Parkinson's disease: Secondary | ICD-10-CM | POA: Diagnosis not present

## 2020-05-25 DIAGNOSIS — G4730 Sleep apnea, unspecified: Secondary | ICD-10-CM | POA: Diagnosis not present

## 2020-05-26 ENCOUNTER — Ambulatory Visit: Payer: Medicare Other | Admitting: Physical Therapy

## 2020-05-26 ENCOUNTER — Other Ambulatory Visit: Payer: Self-pay

## 2020-05-26 DIAGNOSIS — R2681 Unsteadiness on feet: Secondary | ICD-10-CM

## 2020-05-26 DIAGNOSIS — R2689 Other abnormalities of gait and mobility: Secondary | ICD-10-CM

## 2020-05-26 DIAGNOSIS — M6281 Muscle weakness (generalized): Secondary | ICD-10-CM | POA: Diagnosis not present

## 2020-05-26 DIAGNOSIS — R29818 Other symptoms and signs involving the nervous system: Secondary | ICD-10-CM | POA: Diagnosis not present

## 2020-05-26 DIAGNOSIS — Z9181 History of falling: Secondary | ICD-10-CM | POA: Diagnosis not present

## 2020-05-27 NOTE — Therapy (Signed)
[facility] [address] [place], [place], [address] Phone: [phone]   Fax:  [phone]  Physical Therapy Treatment  Patient Details  Name: Veronica Shaw MRN: [id number] Date of Birth: 1951/05/30 Referring Provider (PT): Alonza Bogus, DO   Encounter Date: 05/26/2020   PT End of Session - 05/27/20 2140    Visit Number 4    Number of Visits 17    Date for PT Re-Evaluation 08/04/20   60 day POC, 90 day cert   Authorization Type Medicare    Progress Note Due on Visit 10    PT Start Time 1103    PT Stop Time 1145    PT Time Calculation (min) 42 min    Equipment Utilized During Treatment Gait belt    Activity Tolerance Patient tolerated treatment well   Pt reports less pain at end of session   Behavior During Therapy [facility] for tasks assessed/performed           Past Medical History:  Diagnosis Date  . Asthma   . Chronic systolic CHF (congestive heart failure) ([place])    a. cMRI 4/15: EF 34% and findings - c/w NICM, normal RV size and function (RVEF 61%), Mild MR // b. Echo 2/15:  EF 30-35%, diff HK, ant-sept AK, Gr 2 DD, mild MR, trivial TR  //  c. Echo 5/17: EF 20-25%, severe diffuse HK, marked systolic dyssynchrony, grade 1 diastolic dysfunction, mild MR  //  d. RHC 5/17: Fick CO 2.9, RVSP 19, PASP 15, PW mean 2, low filing pressures and preserved CO   . Diabetes mellitus   . Flu 10/17/2017  . Gastritis   . History of echocardiogram    Echo 6/18: EF 30-35, diffuse HK, grade 1 diastolic dysfunction, trivial MR, mild LAE, mild TR, no pericardial effusion  . History of nuclear stress test    Myoview 5/18: EF 49, no ischemia, inferoseptal defect c/w LBBB artifact (intermediate risk due to EF < 50).  [name] Kitchen HTN (hypertension)   . Hyperlipidemia   . NICM (nonischemic cardiomyopathy) ([place])    a. Nuclear 5/13: Normal stress nuclear study. LV Ejection Fraction: 58%  //  b. LHC 10/14: Minor luminal irregularity in prox LAD, EF  35%   . Parkinson disease ([place])   . Plantar fasciitis   . Sleep apnea    was retested and no longer had it and so d/c CPAP  . Urticaria     Past Surgical History:  Procedure Laterality Date  . BREAST EXCISIONAL BIOPSY Left 1970   benign cyst  . BREAST SURGERY Left   . BUNIONECTOMY    . CARDIAC CATHETERIZATION N/A 12/16/2015   Procedure: Right Heart Cath;  Surgeon: Sherren Mocha, MD;  Location: [facility];  Service: Cardiovascular;  Laterality: N/A;  . CHOLECYSTECTOMY    . eye lid surgery Bilateral 05/09/2019  . IMPLANTABLE CARDIOVERTER DEFIBRILLATOR IMPLANT  11-25-13   MDT dual chamber ICD implanted by [name] for primary prevention  . IMPLANTABLE CARDIOVERTER DEFIBRILLATOR IMPLANT N/A 11/25/2013   Procedure: IMPLANTABLE CARDIOVERTER DEFIBRILLATOR IMPLANT;  Surgeon: [name], MD;  Location: [facility] CATH LAB;  Service: Cardiovascular;  Laterality: N/A;  . LEFT AND RIGHT HEART CATHETERIZATION WITH CORONARY ANGIOGRAM N/A 05/31/2013   Procedure: LEFT AND RIGHT HEART CATHETERIZATION WITH CORONARY ANGIOGRAM;  Surgeon: [name], MD;  Location[facility] CATH LAB;  Service: Cardiovascular;  Laterality: N/A;  . TONSILLECTOMY    . TUBAL LIGATION  There were no vitals filed for this visit.            05/26/20 1107  Symptoms/Limitations  Subjective Got good news from my kidney doctor yesterday that I'm no longer in kidney failure.  Pertinent History PD, chronic systolic CHF, diabetes, HTN, HLD  Limitations Walking  Patient Stated Goals wants to walk better  Pain Assessment  Currently in Pain? Yes  Pain Score 6  Pain Location Leg  Pain Orientation Right;Left  Pain Descriptors / Indicators Aching;Dull  Pain Type Acute pain  Pain Onset In the past 7 days  Aggravating Factors  worse at night  Pain Relieving Factors unsure               [facility] Adult PT Treatment/Exercise - 05/27/20 0001      Transfers   Transfers Sit to Stand;Stand to Sit    Sit to  Stand 5: Supervision;From bed;From chair/3-in-1    Sit to Stand Details Verbal cues for sequencing;Verbal cues for technique    Stand to Sit 5: Supervision;To chair/3-in-1;With armrests;With upper extremity assist    Stand to Sit Details (indicate cue type and reason) Verbal cues for sequencing;Verbal cues for technique      Ambulation/Gait   Ambulation/Gait Yes    Ambulation/Gait Assistance 5: Supervision;4: Min guard    Ambulation/Gait Assistance Details Used rollator with gait today.  Cues for increased step length and upright posture.    Ambulation Distance (Feet) 115 Feet   x 2, 100 ft x 2   Assistive device Rollator    Gait Pattern Step-through pattern;Decreased step length - right;Decreased step length - left;Antalgic;Narrow base of support    Ambulation Surface Level;Indoor    Stairs Yes    Stairs Assistance 4: Min guard    Stairs Assistance Details (indicate cue type and reason) Cues for correct leading foot and for foot placement/foot clearance.    Stair Management Technique One rail Left;Step to pattern;Forwards    Number of Stairs 4   x 3   Height of Stairs 6      Knee/Hip Exercises: Stretches   Active Hamstring Stretch Right;Left;20 seconds;3 reps    Active Hamstring Stretch Limitations foot propped on 4" block.          Additional gait, simulating hallway in home, including turns with rollator, 20-30 ft, x 4 reps with supervision.   REviewed HEP, with pt return demo understanding.  Seated March - 1-2 x daily - 5 x weekly - 1 sets - 10 reps Seated Long Arc Quad - 1-2 x daily - 5 x weekly - 1 sets - 10 reps Seated Ankle Pumps - 1-2 x daily - 5 x weekly - 1 sets - 10 reps     Neuro Re-education:  Pt performs PWR! Moves in seated position    PWR! Up for improved posture x 10 reps, with visual, verbal, tactile cues for posture, position, technique.  Pt has tendency for posterior lean, cues for upright posture and scapular squeeze.  PWR! Rock for improved  weighshifting x 5 reps each side, with visual, verbal cues.  Attempted higher reach, but pt unable due to shoulder pain.             PWR! Twist for improved trunk rotation - attempted x 5 reps each side, pt very limited in trunk rotation and has difficulty moving through hips to increase trunk rotation.   PWR! Step for improved step initiation-single limb step out and in, x 10 reps each side.  Cues for technique and step height/foot clearance.      PT Short Term Goals - 05/08/20 0817      PT SHORT TERM GOAL #1   Title Pt will be independent with initial HEP in order to build upon functional gains made in therapy. ALL STGS DUE 06/05/20    Time 4    Period Weeks    Status New    Target Date 06/05/20      PT SHORT TERM GOAL #2   Title Pt will improve gait speed to at least 1.8 ft/sec with RW vs. straight cane with 3 prong tip in order to demo decr fall risk.    Baseline 1.54 ft/sec    Time 4    Period Weeks    Status New      PT SHORT TERM GOAL #3   Title Pt will decr TUG score to 16.5 seconds or less with SPC with 3 prong tip in order to demo decr fall risk.    Baseline 19.08 seconds    Time 4    Period Weeks    Status New      PT SHORT TERM GOAL #4   Title Pt will perform 5x sit <> stand in 24 seconds or less with no UE support and no episodes of posterior bracing against chair.    Baseline 27.22 seconds    Time 4    Period Weeks    Status New      PT SHORT TERM GOAL #5   Title Pt will verbalize understanding of fall prevention in the home.    Time 4    Period Weeks    Status New             PT Long Term Goals - 05/08/20 6503      PT LONG TERM GOAL #1   Title Pt will be independent with final HEP in order to build upon functional gains made in therapy. ALL LTGS DUE 07/03/20    Time 8    Period Weeks    Status New    Target Date 07/03/20      PT LONG TERM GOAL #2   Title Pt will improve gait speed to at least 2.2 ft/sec with RW vs. straight cane with 3 prong  tip in order to demo decr fall risk and improved gait efficiency    Baseline 1.54 ft/sec    Time 8    Period Weeks    Status New      PT LONG TERM GOAL #3   Title Pt will perform 5x sit <> stand in 24 seconds or less with no UE support and no episodes of posterior bracing against chair.    Baseline 27.22 seconds    Time 8    Period Weeks    Status New      PT LONG TERM GOAL #4   Title Pt will improve BERG score to at least a 50/56 in order to demo decr fall risk.    Baseline 44/56    Time 8    Period Weeks    Status New      PT LONG TERM GOAL #5   Title Pt will decr TUG score to 14 seconds or less with SPC with 3 prong tip in order to demo decr fall risk.    Baseline 19.08 seconds    Time 8    Period Weeks    Status New  Plan - 05/27/20 2141    Clinical Impression Statement Reviewed seated HEP and performed again seated PWR! Moves.  Also gait and stair training, with pt again feeling better, with less pain at end of session. Discussed ways she can increase activity at home, including walking in hallway several times per day.  Pt will continue to benefit from skilled PT to work towards goals for improved overall mobility.    Personal Factors and Comorbidities Comorbidity 3+;Past/Current Experience;Time since onset of injury/illness/exacerbation    Comorbidities PD, chronic systolic CHF, diabetes, HTN, HLD    Examination-Activity Limitations Locomotion Level;Transfers;Stairs    Examination-Participation Restrictions Community Activity;Shop    Stability/Clinical Decision Making Stable/Uncomplicated    Rehab Potential Good    PT Frequency 2x / week    PT Duration 8 weeks    PT Treatment/Interventions ADLs/Self Care Home Management;DME Instruction;Gait training;Stair training;Functional mobility training;Therapeutic activities;Therapeutic exercise;Balance training;Neuromuscular re-education;Patient/family education;Vestibular;Energy conservation    PT Next  Visit Plan Review stair negotiation (how did it go at family's home?); gait training and turning wtih gait; continue with seated PWR! Moves and give as HEP as appropriate; transfers and balance work; continue to encourage movement/activity at home.    Consulted and Agree with Plan of Care Patient           Patient will benefit from skilled therapeutic intervention in order to improve the following deficits and impairments:  Abnormal gait, Decreased balance, Decreased activity tolerance, Decreased strength, Difficulty walking, Impaired sensation, Decreased endurance  Visit Diagnosis: Other abnormalities of gait and mobility  Muscle weakness (generalized)  Unsteadiness on feet     Problem List Patient Active Problem List   Diagnosis Date Noted  . Chronic rhinitis 12/18/2019  . Chronic respiratory failure with hypoxia ([place]) 12/18/2019  . Back pain 09/18/2019  . Post-nasal drip 06/06/2019  . Hyperlipidemia associated with type 2 diabetes mellitus ([place]) 03/05/2019  . Contact dermatitis 03/05/2019  . Cervical disc disorder with radiculopathy of cervical region 09/24/2018  . Leg cramping 12/03/2017  . Cognitive impairment 10/19/2017  . Syncope   . Osteopenia 07/10/2015  . Mild persistent asthma in adult without complication 91/69/4503  . NICM (nonischemic cardiomyopathy) ([place]) 04/23/2014  . Arthritis or polyarthritis, rheumatoid ([place]) 03/12/2014  . ICD (implantable cardioverter-defibrillator), dual, in situ 03/06/2014  . Thoracic or lumbosacral neuritis or radiculitis, unspecified 07/03/2013  . Chronic systolic heart failure ([place]) 05/31/2013  . Parkinson disease ([place]) 05/21/2013  . Major depression 03/31/2013  . Vertigo 03/03/2013  . GERD (gastroesophageal reflux disease) 09/27/2012  . Plantar fasciitis, bilateral 06/15/2012  . 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 Diabetic peripheral neuropathy ([place]) 12/09/2011  . Hyperlipidemia 12/09/2011  . Chronic urticaria 05/27/2011  .  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 05/20/2011  . ROSACEA 05/29/2009  . ACHILLES BURSITIS OR TENDINITIS 03/27/2009  . Type II diabetes mellitus with complication ([place]) 88/82/8003  . Essential hypertension 09/28/2006    Keiden Deskin W. 05/27/2020, 9:46 PM  Frazier Butt., PT   [facility] [address] [place], [place], [address] Phone: [phone]   Fax:  [phone]  Name: Veronica Shaw MRN: [id number] Date of Birth: [birth year]

## 2020-05-28 ENCOUNTER — Ambulatory Visit (INDEPENDENT_AMBULATORY_CARE_PROVIDER_SITE_OTHER): Payer: Medicare Other

## 2020-05-28 DIAGNOSIS — I428 Other cardiomyopathies: Secondary | ICD-10-CM

## 2020-05-28 LAB — CUP PACEART REMOTE DEVICE CHECK
Battery Remaining Longevity: 32 mo
Battery Voltage: 2.96 V
Brady Statistic AP VP Percent: 0 %
Brady Statistic AP VS Percent: 0.01 %
Brady Statistic AS VP Percent: 0.03 %
Brady Statistic AS VS Percent: 99.96 %
Brady Statistic RA Percent Paced: 0.01 %
Brady Statistic RV Percent Paced: 0.03 %
Date Time Interrogation Session: 20211028022824
HighPow Impedance: 81 Ohm
Implantable Lead Implant Date: 20150427
Implantable Lead Implant Date: 20150427
Implantable Lead Location: 753859
Implantable Lead Location: 753860
Implantable Lead Model: 5076
Implantable Lead Model: 6935
Implantable Pulse Generator Implant Date: 20150427
Lead Channel Impedance Value: 399 Ohm
Lead Channel Impedance Value: 4047 Ohm
Lead Channel Impedance Value: 4047 Ohm
Lead Channel Impedance Value: 4047 Ohm
Lead Channel Impedance Value: 532 Ohm
Lead Channel Impedance Value: 608 Ohm
Lead Channel Pacing Threshold Amplitude: 0.375 V
Lead Channel Pacing Threshold Amplitude: 0.625 V
Lead Channel Pacing Threshold Pulse Width: 0.4 ms
Lead Channel Pacing Threshold Pulse Width: 0.4 ms
Lead Channel Sensing Intrinsic Amplitude: 11.375 mV
Lead Channel Sensing Intrinsic Amplitude: 11.375 mV
Lead Channel Sensing Intrinsic Amplitude: 2 mV
Lead Channel Sensing Intrinsic Amplitude: 2 mV
Lead Channel Setting Pacing Amplitude: 2 V
Lead Channel Setting Pacing Amplitude: 2.5 V
Lead Channel Setting Pacing Pulse Width: 0.4 ms
Lead Channel Setting Sensing Sensitivity: 0.3 mV

## 2020-05-29 ENCOUNTER — Ambulatory Visit: Payer: Medicare Other | Admitting: Physical Therapy

## 2020-06-02 ENCOUNTER — Ambulatory Visit: Payer: Medicare Other | Attending: Neurology | Admitting: Physical Therapy

## 2020-06-02 ENCOUNTER — Other Ambulatory Visit: Payer: Self-pay

## 2020-06-02 DIAGNOSIS — R2681 Unsteadiness on feet: Secondary | ICD-10-CM | POA: Diagnosis not present

## 2020-06-02 DIAGNOSIS — R29818 Other symptoms and signs involving the nervous system: Secondary | ICD-10-CM

## 2020-06-02 DIAGNOSIS — R2689 Other abnormalities of gait and mobility: Secondary | ICD-10-CM | POA: Insufficient documentation

## 2020-06-02 DIAGNOSIS — M6281 Muscle weakness (generalized): Secondary | ICD-10-CM | POA: Diagnosis not present

## 2020-06-02 NOTE — Progress Notes (Signed)
Remote ICD transmission.   

## 2020-06-02 NOTE — Therapy (Signed)
[facility] [address] [place], [place], [address] Phone: [phone]   Fax:  [phone]  Physical Therapy Treatment  Patient Details  Name: Veronica Shaw MRN: [id number] Date of Birth: January 12, 1951 Referring Provider (PT): Alonza Bogus, DO   Encounter Date: 06/02/2020   PT End of Session - 06/02/20 1018    Visit Number 5    Number of Visits 17    Date for PT Re-Evaluation 08/04/20   60 day POC, 90 day cert   Authorization Type Medicare    Progress Note Due on Visit 10    PT Start Time 0930    PT Stop Time 1014    PT Time Calculation (min) 44 min    Equipment Utilized During Treatment Gait belt    Activity Tolerance Patient tolerated treatment well    Behavior During Therapy WFL for tasks assessed/performed           Past Medical History:  Diagnosis Date  . Asthma   . Chronic systolic CHF (congestive heart failure) ([place])    a. cMRI 4/15: EF 34% and findings - c/w NICM, normal RV size and function (RVEF 61%), Mild MR // b. Echo 2/15:  EF 30-35%, diff HK, ant-sept AK, Gr 2 DD, mild MR, trivial TR  //  c. Echo 5/17: EF 20-25%, severe diffuse HK, marked systolic dyssynchrony, grade 1 diastolic dysfunction, mild MR  //  d. RHC 5/17: Fick CO 2.9, RVSP 19, PASP 15, PW mean 2, low filing pressures and preserved CO   . Diabetes mellitus   . Flu 10/17/2017  . Gastritis   . History of echocardiogram    Echo 6/18: EF 30-35, diffuse HK, grade 1 diastolic dysfunction, trivial MR, mild LAE, mild TR, no pericardial effusion  . History of nuclear stress test    Myoview 5/18: EF 49, no ischemia, inferoseptal defect c/w LBBB artifact (intermediate risk due to EF < 50).  [name] Kitchen HTN (hypertension)   . Hyperlipidemia   . NICM (nonischemic cardiomyopathy) ([place])    a. Nuclear 5/13: Normal stress nuclear study. LV Ejection Fraction: 58%  //  b. LHC 10/14: Minor luminal irregularity in prox LAD, EF 35%   . Parkinson disease ([place])   .  Plantar fasciitis   . Sleep apnea    was retested and no longer had it and so d/c CPAP  . Urticaria     Past Surgical History:  Procedure Laterality Date  . BREAST EXCISIONAL BIOPSY Left 1970   benign cyst  . BREAST SURGERY Left   . BUNIONECTOMY    . CARDIAC CATHETERIZATION N/A 12/16/2015   Procedure: Right Heart Cath;  Surgeon: Sherren Mocha, MD;  Location: [facility];  Service: Cardiovascular;  Laterality: N/A;  . CHOLECYSTECTOMY    . eye lid surgery Bilateral 05/09/2019  . IMPLANTABLE CARDIOVERTER DEFIBRILLATOR IMPLANT  11-25-13   MDT dual chamber ICD implanted by [name] for primary prevention  . IMPLANTABLE CARDIOVERTER DEFIBRILLATOR IMPLANT N/A 11/25/2013   Procedure: IMPLANTABLE CARDIOVERTER DEFIBRILLATOR IMPLANT;  Surgeon: [name], MD;  Location: [facility];  Service: Cardiovascular;  Laterality: N/A;  . LEFT AND RIGHT HEART CATHETERIZATION WITH CORONARY ANGIOGRAM N/A 05/31/2013   Procedure: LEFT AND RIGHT HEART CATHETERIZATION WITH CORONARY ANGIOGRAM;  Surgeon: [name], MD;  Location: [facility] CATH LAB;  Service: Cardiovascular;  Laterality: N/A;  . TONSILLECTOMY    . TUBAL LIGATION      There were no vitals filed for this visit.  Subjective Assessment - 06/02/20 0934    Subjective Had a couple of almost falls - noticed that she was getting up too fast. Exercises are going well at home. Stairs went well over the weekend going over her R leg.    Pertinent History PD, chronic systolic CHF, diabetes, HTN, HLD    Limitations Walking    Patient Stated Goals wants to walk better    Currently in Pain? No/denies    Pain Onset In the past 7 days                             [facility] Adult PT Treatment/Exercise - 06/02/20 0941      Transfers   Transfers Sit to Stand;Stand to Sit    Sit to Stand 5: Supervision;From bed;From chair/3-in-1    Sit to Stand Details Verbal cues for sequencing;Verbal cues for technique    Five time sit to stand  comments  34.09 seconds with BUE support from mat table, proper technique, no episodes of BLE bracing    Stand to Sit 5: Supervision;To chair/3-in-1;With armrests;With upper extremity assist    Stand to Sit Details (indicate cue type and reason) Verbal cues for sequencing;Verbal cues for technique    Transfer Cueing needing reminder cues to place hands on mat table to push up from vs. rollator    Comments x5 reps sit <> stands from mat table working on tall posture once in standing      Ambulation/Gait   Ambulation/Gait Yes    Ambulation/Gait Assistance 5: Supervision    Ambulation/Gait Assistance Details pt ambulates into clinic wiht rollator - cues for posture, incr step length, and keeping feet under the seat to stay closer to rolator    Ambulation Distance (Feet) 115 Feet   x2   Assistive device Rollator    Gait Pattern Step-through pattern;Decreased step length - right;Decreased step length - left;Antalgic;Narrow base of support    Ambulation Surface Level;Indoor    Stairs Yes    Stairs Assistance 4: Min guard    Stairs Assistance Details (indicate cue type and reason) pt using SPC with quad tip in RUE, needing verbal cues for proper cane placement initially, pt able to verbalize/demo understanding of proper sequencing of leading foot    Stair Management Technique One rail Left;Step to pattern;Forwards    Number of Stairs 4   x2 reps   Height of Stairs 6      Knee/Hip Exercises: Stretches   Active Hamstring Stretch Right;Left;20 seconds;3 reps    Active Hamstring Stretch Limitations added to HEP, verbal and demo cues for initial proper technique              Neuro Re-education:  Pt performs PWR! Moves inseatedposition   PWR! Up for improved posture: beginning first with leaning forward and coming up tall with B hands on knees - cues for scap retraction once seated x10 reps. Then performing x 10 reps with extending BUE, with visual, verbal, tactile cues for posture,  position, technique. Pt has tendency for posterior lean, cues for upright posture and scapular squeeze.  PWR! Rock for improved weighshiftingx 5 reps each side, with visual, verbal cues - reaching up and across body  PWR! Twist for improved trunk rotation- 2 x 5 reps each side - with use of boomwhackers for incr trunk rotation and intensity of movement, still limited in trunk rotation.       PT Education - 06/02/20 1018  Education Details added seated hamstring stretch to HEP    Person(s) Educated Patient    Methods Explanation;Demonstration;Handout    Comprehension Verbalized understanding;Returned demonstration            PT Short Term Goals - 06/02/20 1028      PT SHORT TERM GOAL #1   Title Pt will be independent with initial HEP in order to build upon functional gains made in therapy. ALL STGS DUE 06/05/20    Time 4    Period Weeks    Status New    Target Date 06/05/20      PT SHORT TERM GOAL #2   Title Pt will improve gait speed to at least 1.8 ft/sec with RW vs. straight cane with 3 prong tip in order to demo decr fall risk.    Baseline 1.54 ft/sec    Time 4    Period Weeks    Status New      PT SHORT TERM GOAL #3   Title Pt will decr TUG score to 16.5 seconds or less with SPC with 3 prong tip in order to demo decr fall risk.    Baseline 19.08 seconds    Time 4    Period Weeks    Status New      PT SHORT TERM GOAL #4   Title Pt will perform 5x sit <> stand in 24 seconds or less with no UE support and no episodes of posterior bracing against chair.    Baseline 34.09 seconds with BUE support from mat table, proper technique, no episodes of BLE bracing    Time 4    Period Weeks    Status Not Met      PT SHORT TERM GOAL #5   Title Pt will verbalize understanding of fall prevention in the home.    Time 4    Period Weeks    Status New             PT Long Term Goals - 05/08/20 0938      PT LONG TERM GOAL #1   Title Pt will be independent with  final HEP in order to build upon functional gains made in therapy. ALL LTGS DUE 07/03/20    Time 8    Period Weeks    Status New    Target Date 07/03/20      PT LONG TERM GOAL #2   Title Pt will improve gait speed to at least 2.2 ft/sec with RW vs. straight cane with 3 prong tip in order to demo decr fall risk and improved gait efficiency    Baseline 1.54 ft/sec    Time 8    Period Weeks    Status New      PT LONG TERM GOAL #3   Title Pt will perform 5x sit <> stand in 24 seconds or less with no UE support and no episodes of posterior bracing against chair.    Baseline 27.22 seconds    Time 8    Period Weeks    Status New      PT LONG TERM GOAL #4   Title Pt will improve BERG score to at least a 50/56 in order to demo decr fall risk.    Baseline 44/56    Time 8    Period Weeks    Status New      PT LONG TERM GOAL #5   Title Pt will decr TUG score to 14 seconds or less with SPC with 3  prong tip in order to demo decr fall risk.    Baseline 19.08 seconds    Time 8    Period Weeks    Status New                 Plan - 06/02/20 1028    Clinical Impression Statement Reviewed stair training today with pt able to verbalize and demo understanding of proper leading leg when performing steps, did need initial cueing for proper cane placement. Continued seated PWR moves today - utilized boomwhackers today for incr trunk rotation and intensity of movement. Pt continues to have posterior lean with PWR Up for posture. Will continue to review before adding to pt's HEP. Will continue to progress towards LTGs.    Personal Factors and Comorbidities Comorbidity 3+;Past/Current Experience;Time since onset of injury/illness/exacerbation    Comorbidities PD, chronic systolic CHF, diabetes, HTN, HLD    Examination-Activity Limitations Locomotion Level;Transfers;Stairs    Examination-Participation Restrictions Community Activity;Shop    Stability/Clinical Decision Making Stable/Uncomplicated      Rehab Potential Good    PT Frequency 2x / week    PT Duration 8 weeks    PT Treatment/Interventions ADLs/Self Care Home Management;DME Instruction;Gait training;Stair training;Functional mobility training;Therapeutic activities;Therapeutic exercise;Balance training;Neuromuscular re-education;Patient/family education;Vestibular;Energy conservation    PT Next Visit Plan check STGs. gait training and turning wtih gait; continue with seated PWR! Moves and give as HEP as appropriate; transfers and balance work; continue to encourage movement/activity at home.    Consulted and Agree with Plan of Care Patient           Patient will benefit from skilled therapeutic intervention in order to improve the following deficits and impairments:  Abnormal gait, Decreased balance, Decreased activity tolerance, Decreased strength, Difficulty walking, Impaired sensation, Decreased endurance  Visit Diagnosis: Other abnormalities of gait and mobility  Muscle weakness (generalized)  Other symptoms and signs involving the nervous system  Unsteadiness on feet     Problem List Patient Active Problem List   Diagnosis Date Noted  . Chronic rhinitis 12/18/2019  . Chronic respiratory failure with hypoxia ([place]) 12/18/2019  . Back pain 09/18/2019  . Post-nasal drip 06/06/2019  . Hyperlipidemia associated with type 2 diabetes mellitus ([place]) 03/05/2019  . Contact dermatitis 03/05/2019  . Cervical disc disorder with radiculopathy of cervical region 09/24/2018  . Leg cramping 12/03/2017  . Cognitive impairment 10/19/2017  . Syncope   . Osteopenia 07/10/2015  . Mild persistent asthma in adult without complication 40/97/3532  . NICM (nonischemic cardiomyopathy) ([place]) 04/23/2014  . Arthritis or polyarthritis, rheumatoid ([place]) 03/12/2014  . ICD (implantable cardioverter-defibrillator), dual, in situ 03/06/2014  . Thoracic or lumbosacral neuritis or radiculitis, unspecified 07/03/2013  . Chronic systolic heart  failure ([place]) 05/31/2013  . Parkinson disease ([place]) 05/21/2013  . Major depression 03/31/2013  . Vertigo 03/03/2013  . GERD (gastroesophageal reflux disease) 09/27/2012  . Plantar fasciitis, bilateral 06/15/2012  . 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 Diabetic peripheral neuropathy ([place]) 12/09/2011  . Hyperlipidemia 12/09/2011  . Chronic urticaria 05/27/2011  . 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 05/20/2011  . ROSACEA 05/29/2009  . ACHILLES BURSITIS OR TENDINITIS 03/27/2009  . Type II diabetes mellitus with complication ([place]) 99/24/2683  . Essential hypertension 09/28/2006    Arliss Journey, PT, DPT  06/02/2020, 11:08 AM  [facility] [address] [place], [place], [address] Phone: [phone]   Fax:  [phone]  Name: Veronica Shaw MRN: [id number] Date of Birth: April 05, 1951

## 2020-06-02 NOTE — Patient Instructions (Signed)
Access Code: [email or web address] URL: [email or web address] Date: 06/02/2020 Prepared by: [name]  Exercises Seated March - 1-2 x daily - 5 x weekly - 1 sets - 10 reps Seated Long Arc Quad - 1-2 x daily - 5 x weekly - 1 sets - 10 reps Seated Ankle Pumps - 1-2 x daily - 5 x weekly - 1 sets - 10 reps Seated Hamstring Stretch - 1 x daily - 5 x weekly - 2-3 sets - 20-25 hold

## 2020-06-05 ENCOUNTER — Encounter: Payer: Self-pay | Admitting: Physical Therapy

## 2020-06-05 ENCOUNTER — Other Ambulatory Visit: Payer: Self-pay | Admitting: Internal Medicine

## 2020-06-05 ENCOUNTER — Other Ambulatory Visit: Payer: Self-pay

## 2020-06-05 ENCOUNTER — Ambulatory Visit: Payer: Medicare Other | Admitting: Physical Therapy

## 2020-06-05 DIAGNOSIS — R29818 Other symptoms and signs involving the nervous system: Secondary | ICD-10-CM

## 2020-06-05 DIAGNOSIS — R2689 Other abnormalities of gait and mobility: Secondary | ICD-10-CM

## 2020-06-05 DIAGNOSIS — R2681 Unsteadiness on feet: Secondary | ICD-10-CM | POA: Diagnosis not present

## 2020-06-05 DIAGNOSIS — M6281 Muscle weakness (generalized): Secondary | ICD-10-CM | POA: Diagnosis not present

## 2020-06-05 NOTE — Therapy (Signed)
[facility] [address] [place], [place], [address] Phone: [phone]   Fax:  [phone]  Physical Therapy Treatment  Patient Details  Name: Veronica Shaw MRN: [id number] Date of Birth: 09-Oct-1950 Referring Provider (PT): Alonza Bogus, DO   Encounter Date: 06/05/2020   PT End of Session - 06/05/20 1255    Visit Number 6    Number of Visits 17    Date for PT Re-Evaluation 08/04/20   60 day POC, 90 day cert   Authorization Type Medicare    Progress Note Due on Visit 10    PT Start Time 0851    PT Stop Time 0933    PT Time Calculation (min) 42 min    Equipment Utilized During Treatment Gait belt    Activity Tolerance Patient tolerated treatment well    Behavior During Therapy WFL for tasks assessed/performed           Past Medical History:  Diagnosis Date   Asthma    Chronic systolic CHF (congestive heart failure) ([place])    a. cMRI 4/15: EF 34% and findings - c/w NICM, normal RV size and function (RVEF 61%), Mild MR // b. Echo 2/15:  EF 30-35%, diff HK, ant-sept AK, Gr 2 DD, mild MR, trivial TR  //  c. Echo 5/17: EF 20-25%, severe diffuse HK, marked systolic dyssynchrony, grade 1 diastolic dysfunction, mild MR  //  d. Leach 5/17: Fick CO 2.9, RVSP 19, PASP 15, PW mean 2, low filing pressures and preserved CO    Diabetes mellitus    Flu 10/17/2017   Gastritis    History of echocardiogram    Echo 6/18: EF 30-35, diffuse HK, grade 1 diastolic dysfunction, trivial MR, mild LAE, mild TR, no pericardial effusion   History of nuclear stress test    Myoview 5/18: EF 49, no ischemia, inferoseptal defect c/w LBBB artifact (intermediate risk due to EF < 50).   HTN (hypertension)    Hyperlipidemia    NICM (nonischemic cardiomyopathy) ([place])    a. Nuclear 5/13: Normal stress nuclear study. LV Ejection Fraction: 58%  //  b. LHC 10/14: Minor luminal irregularity in prox LAD, EF 35%    Parkinson disease ([facility])     Plantar fasciitis    Sleep apnea    was retested and no longer had it and so d/c CPAP   Urticaria     Past Surgical History:  Procedure Laterality Date   BREAST EXCISIONAL BIOPSY Left 1970   benign cyst   BREAST SURGERY Left    BUNIONECTOMY     CARDIAC CATHETERIZATION N/A 12/16/2015   Procedure: Right Heart Cath;  Surgeon: Sherren Mocha, MD;  Location: [facility];  Service: Cardiovascular;  Laterality: N/A;   CHOLECYSTECTOMY     eye lid surgery Bilateral 05/09/2019   IMPLANTABLE CARDIOVERTER DEFIBRILLATOR IMPLANT  11-25-13   MDT dual chamber ICD implanted by [name] for primary prevention   IMPLANTABLE CARDIOVERTER DEFIBRILLATOR IMPLANT N/A 11/25/2013   Procedure: IMPLANTABLE CARDIOVERTER DEFIBRILLATOR IMPLANT;  Surgeon: [name], MD;  Location: [facility];  Service: Cardiovascular;  Laterality: N/A;   LEFT AND RIGHT HEART CATHETERIZATION WITH CORONARY ANGIOGRAM N/A 05/31/2013   Procedure: LEFT AND RIGHT HEART CATHETERIZATION WITH CORONARY ANGIOGRAM;  Surgeon: [name], MD;  Location: [facility] CATH LAB;  Service: Cardiovascular;  Laterality: N/A;   TONSILLECTOMY     TUBAL LIGATION      There were no vitals filed for this visit.  Subjective Assessment - 06/05/20 0854    Subjective Noticed she might have had an almost fall - feels a little off balance when she is turning. Exercises are going well.    Pertinent History PD, chronic systolic CHF, diabetes, HTN, HLD    Limitations Walking    Patient Stated Goals wants to walk better    Currently in Pain? Yes    Pain Score 3     Pain Location Leg   muscles in the legs   Pain Orientation Right;Left    Pain Descriptors / Indicators Sore    Pain Type Chronic pain    Pain Onset In the past 7 days    Aggravating Factors  usually at night    Pain Relieving Factors unsure - a tablespoon of mustard              [facility] PT Assessment - 06/05/20 0907      Ambulation/Gait   Ambulation/Gait Yes     Ambulation/Gait Assistance 5: Supervision    Assistive device Rollator    Gait Pattern Step-through pattern;Decreased step length - right;Decreased step length - left;Antalgic;Narrow base of support    Ambulation Surface Level;Indoor    Gait velocity 23.59 seconds = 1.39 ft/sec with rollator       Timed Up and Go Test   Normal TUG (seconds) 34.25   with rollator   TUG Comments 28.16 seconds with SPC with 3 prong tip                         [facility] Adult PT Treatment/Exercise - 06/05/20 0907      Transfers   Comments needing cues for proper brake management with rollator with sit <> stand transfes      Ambulation/Gait   Ambulation/Gait Assistance Details initial cues to stay closer to rollator and for incr stride length, pt at times demonstrating a little bit of a limp on RLE. min guard with gait with SPC with 3 prong tip for small distances    Ambulation Distance (Feet) 115 Feet   plus small distances with cane   Gait Comments gait training with SPC with 3 prong tip and ambulating around cones to practice wide turns with min guard- cues for wider BOS and cane placement as pt with tendency to put it too close to body, pt reporting 3/10 dizziness after performing when looking down (gone when sitting down). Pt reporting sometimes almost tripping over her cane with her R foot because she forgets proper cane placement. Discussed for safety with pt doing more walking around the house with her RW for incr safety, pt in agreement.       Therapeutic Activites    Therapeutic Activities Other Therapeutic Activities    Other Therapeutic Activities After pt reporting dizziness with looking down and performing turns, discussed that pt might potentially benefit from a vestibular assessment from one of our vestibular trained therapists here. Pt also stating that she feels like her dizziness might be from her medication, instructed pt to discuss this with her doctor with her medications (pt  reporting she forgot last time and will write it down next time). Also discussed with pt the potential of a bout of speech therapy to assist with memory strategies and pt's family member can be present during the sessions, pt reporting she would not be interested at this time. Pt reporting that she spends most of the day in bed and has been doing exercises primarily in the  bed, reviewed pt's current HEP with seated strengthening and encouraged pt to move more throughout the day and perform walking down the hallway with RW vs. staying the majority of the day in bed, pt verbalized understanding.             PWR! Up for improved posture: x10 reps with visual, verbal, tactile cues for posture, position, technique. Pt has tendency for posterior lean, cues for upright posture and scapular squeeze.   Access Code: [email or web address] URL: [email or web address] Date: 05/21/2020 Prepared by: Mady Haagensen  Reviewed pt's current HEP as pt reports not consistently performing at home.   Exercises Seated March - 1-2 x daily - 5 x weekly - 1 sets - 10 reps Seated Long Arc Quad - 1-2 x daily - 5 x weekly - 1 sets - 10 reps Seated Ankle Pumps - 1-2 x daily - 5 x weekly - 1 sets - 10 reps     PT Education - 06/05/20 1255    Education Details see TA.    Person(s) Educated Patient    Methods Explanation    Comprehension Verbalized understanding            PT Short Term Goals - 06/05/20 0901      PT SHORT TERM GOAL #1   Title Pt will be independent with initial HEP in order to build upon functional gains made in therapy. ALL STGS DUE 06/05/20    Baseline pt reports not consistently performing at home.    Time 4    Period Weeks    Status Not Met    Target Date 06/05/20      PT SHORT TERM GOAL #2   Title Pt will improve gait speed to at least 1.8 ft/sec with RW vs. straight cane with 3 prong tip in order to demo decr fall risk.    Baseline 1.54 ft/sec, 23.59 seconds = 1.39 ft/sec with  rollator    Time 4    Period Weeks    Status Not Met      PT SHORT TERM GOAL #3   Title Pt will decr TUG score to 16.5 seconds or less with SPC with 3 prong tip in order to demo decr fall risk.    Baseline 19.08 seconds, 28.16 with SPC with 3 prong tip and 32.25 seconds with rollator on 06/05/20    Time 4    Period Weeks    Status Not Met      PT SHORT TERM GOAL #4   Title Pt will perform 5x sit <> stand in 24 seconds or less with no UE support and no episodes of posterior bracing against chair.    Baseline 34.09 seconds with BUE support from mat table, proper technique, no episodes of BLE bracing    Time 4    Period Weeks    Status Not Met      PT SHORT TERM GOAL #5   Title Pt will verbalize understanding of fall prevention in the home.    Time 4    Period Weeks    Status New             PT Long Term Goals - 06/05/20 1253      PT LONG TERM GOAL #1   Title Pt will be independent with final HEP in order to build upon functional gains made in therapy. ALL LTGS DUE 07/03/20    Time 8    Period Weeks    Status New  PT LONG TERM GOAL #2   Title Pt will improve gait speed to at least 1.8 ft/sec with rollator  vs. straight cane with 3 prong tip in order to demo decr fall risk and improved gait efficiency    Baseline 1.39 ft/sec with rollator    Time 8    Period Weeks    Status Revised      PT LONG TERM GOAL #3   Title Pt will perform 5x sit <> stand in 24 seconds or less with no UE support and no episodes of posterior bracing against chair.    Baseline 27.22 seconds    Time 8    Period Weeks    Status New      PT LONG TERM GOAL #4   Title Pt will improve BERG score to at least a 50/56 in order to demo decr fall risk.    Baseline 44/56    Time 8    Period Weeks    Status New      PT LONG TERM GOAL #5   Title Pt will decr TUG score to 20 seconds or less with SPC with 3 prong tip vs. rollator in order to demo decr fall risk.    Baseline 28.16 with SPC with 3 prong  tip and 32.25 seconds with rollator on 06/05/20    Time 8    Period Weeks    Status Revised                 Plan - 06/05/20 1256    Clinical Impression Statement Focus of today's skilled session was assessing remainder of pt's STGs. Pt did not meet STGs #1-3. Pt has not been performing her HEP at home consistently and pt reports spending the majority of her day in bed. Continued to encourage incr activity at home by performing seated exercises and walking in her hallway with RW. Pt taking incr time with TUG today (was previously 19.08 seconds with SPC with 3 prong tip), today was 28.16 seconds and 32.25 seconds with rollator. Pt with more slowed gait speed today with rollator at 1.39 ft/sec, indicating pt is at an incr risk for falls. Pt reporting dizziness at times when looking down or performing turns - will continue to monitor in session and pt would possibly benefit from a thorough vestibular assessment. Will continue to progress towards LTGs.    Personal Factors and Comorbidities Comorbidity 3+;Past/Current Experience;Time since onset of injury/illness/exacerbation    Comorbidities PD, chronic systolic CHF, diabetes, HTN, HLD    Examination-Activity Limitations Locomotion Level;Transfers;Stairs    Examination-Participation Restrictions Community Activity;Shop    Stability/Clinical Decision Making Stable/Uncomplicated    Rehab Potential Good    PT Frequency 2x / week    PT Duration 8 weeks    PT Treatment/Interventions ADLs/Self Care Home Management;DME Instruction;Gait training;Stair training;Functional mobility training;Therapeutic activities;Therapeutic exercise;Balance training;Neuromuscular re-education;Patient/family education;Vestibular;Energy conservation    PT Next Visit Plan fall prevention education. gait training and turning wtih gait; continue with seated PWR! Moves and give as HEP as appropriate; transfers and balance work; continue to encourage movement/activity at home.      Consulted and Agree with Plan of Care Patient           Patient will benefit from skilled therapeutic intervention in order to improve the following deficits and impairments:  Abnormal gait, Decreased balance, Decreased activity tolerance, Decreased strength, Difficulty walking, Impaired sensation, Decreased endurance  Visit Diagnosis: Other abnormalities of gait and mobility  Muscle weakness (generalized)  Other symptoms  and signs involving the nervous system  Unsteadiness on feet     Problem List Patient Active Problem List   Diagnosis Date Noted   Chronic rhinitis 12/18/2019   Chronic respiratory failure with hypoxia ([place]) 12/18/2019   Back pain 09/18/2019   Post-nasal drip 06/06/2019   Hyperlipidemia associated with type 2 diabetes mellitus ([place]) 03/05/2019   Contact dermatitis 03/05/2019   Cervical disc disorder with radiculopathy of cervical region 09/24/2018   Leg cramping 12/03/2017   Cognitive impairment 10/19/2017   Syncope    Osteopenia 07/10/2015   Mild persistent asthma in adult without complication 81/19/1478   NICM (nonischemic cardiomyopathy) ([place]) 04/23/2014   Arthritis or polyarthritis, rheumatoid ([place]) 03/12/2014   ICD (implantable cardioverter-defibrillator), dual, in situ 03/06/2014   Thoracic or lumbosacral neuritis or radiculitis, unspecified 29/56/2130   Chronic systolic heart failure ([place]) 05/31/2013   Parkinson disease ([place]) 05/21/2013   Major depression 03/31/2013   Vertigo 03/03/2013   GERD (gastroesophageal reflux disease) 09/27/2012   Plantar fasciitis, bilateral 06/15/2012   Spondyloarthropathy 04/04/2012   Diabetic peripheral neuropathy ([place]) 12/09/2011   Hyperlipidemia 12/09/2011   Chronic urticaria 05/27/2011   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

## 2020-06-09 ENCOUNTER — Encounter: Payer: Self-pay | Admitting: Physical Therapy

## 2020-06-09 ENCOUNTER — Other Ambulatory Visit: Payer: Self-pay

## 2020-06-09 ENCOUNTER — Ambulatory Visit: Payer: Medicare Other | Admitting: Physical Therapy

## 2020-06-09 DIAGNOSIS — M6281 Muscle weakness (generalized): Secondary | ICD-10-CM

## 2020-06-09 DIAGNOSIS — R2681 Unsteadiness on feet: Secondary | ICD-10-CM

## 2020-06-09 DIAGNOSIS — R2689 Other abnormalities of gait and mobility: Secondary | ICD-10-CM | POA: Diagnosis not present

## 2020-06-09 DIAGNOSIS — R29818 Other symptoms and signs involving the nervous system: Secondary | ICD-10-CM | POA: Diagnosis not present

## 2020-06-09 NOTE — Therapy (Signed)
[facility] °[address] °[place], [place], [address] °Phone: [phone]   Fax:  [phone] ° °Physical Therapy Treatment ° °Patient Details  °Name: Veronica Shaw °MRN: [id number] °Date of Birth: 06/20/1951 °Referring Provider (PT): Rebecca Tat, DO ° ° °Encounter Date: 06/09/2020 ° ° PT End of Session - 06/09/20 1003   ° Visit Number 7   ° Number of Visits 17   ° Date for PT Re-Evaluation 08/04/20   60 day POC, 90 day cert  ° Authorization Type Medicare   ° Progress Note Due on Visit 10   ° PT Start Time 0931   ° PT Stop Time 1000   pt requesting to end session early due to stomach not feeling well  ° PT Time Calculation (min) 29 min   ° Activity Tolerance Patient tolerated treatment well   ° Behavior During Therapy WFL for tasks assessed/performed   °  °  °  ° ° °Past Medical History:  °Diagnosis Date  °• Asthma   °• Chronic systolic CHF (congestive heart failure) (HCC)   ° a. cMRI 4/15: EF 34% and findings - c/w NICM, normal RV size and function (RVEF 61%), Mild MR // b. Echo 2/15:  EF 30-35%, diff HK, ant-sept AK, Gr 2 DD, mild MR, trivial TR  //  c. Echo 5/17: EF 20-25%, severe diffuse HK, marked systolic dyssynchrony, grade 1 diastolic dysfunction, mild MR  //  d. RHC 5/17: Fick CO 2.9, RVSP 19, PASP 15, PW mean 2, low filing pressures and preserved CO   °• Diabetes mellitus   °• Flu 10/17/2017  °• Gastritis   °• History of echocardiogram   ° Echo 6/18: EF 30-35, diffuse HK, grade 1 diastolic dysfunction, trivial MR, mild LAE, mild TR, no pericardial effusion  °• History of nuclear stress test   ° Myoview 5/18: EF 49, no ischemia, inferoseptal defect c/w LBBB artifact (intermediate risk due to EF < 50).  °• HTN (hypertension)   °• Hyperlipidemia   °• NICM (nonischemic cardiomyopathy) (HCC)   ° a. Nuclear 5/13: Normal stress nuclear study. LV Ejection Fraction: 58%  //  b. LHC 10/14: Minor luminal irregularity in prox LAD, EF 35%   °• Parkinson  disease (HCC)   °• Plantar fasciitis   °• Sleep apnea   ° was retested and no longer had it and so d/c CPAP  °• Urticaria   ° ° °Past Surgical History:  °Procedure Laterality Date  °• BREAST EXCISIONAL BIOPSY Left 1970  ° benign cyst  °• BREAST SURGERY Left   °• BUNIONECTOMY    °• CARDIAC CATHETERIZATION N/A 12/16/2015  ° Procedure: Right Heart Cath;  Surgeon: Michael Cooper, MD;  Location: [facility];  Service: Cardiovascular;  Laterality: N/A;  °• CHOLECYSTECTOMY    °• eye lid surgery Bilateral 05/09/2019  °• IMPLANTABLE CARDIOVERTER DEFIBRILLATOR IMPLANT  11-25-13  ° MDT dual chamber ICD implanted by Dr Taylor for primary prevention  °• IMPLANTABLE CARDIOVERTER DEFIBRILLATOR IMPLANT N/A 11/25/2013  ° Procedure: IMPLANTABLE CARDIOVERTER DEFIBRILLATOR IMPLANT;  Surgeon: Gregg W Taylor, MD;  Location: [facility];  Service: Cardiovascular;  Laterality: N/A;  °• LEFT AND RIGHT HEART CATHETERIZATION WITH CORONARY ANGIOGRAM N/A 05/31/2013  ° Procedure: LEFT AND RIGHT HEART CATHETERIZATION WITH CORONARY ANGIOGRAM;  Surgeon: Michael D Cooper, MD;  Location: [facility];  Service: Cardiovascular;  Laterality: N/A;  °• TONSILLECTOMY    °• TUBAL LIGATION    ° ° °There were no vitals filed   for this visit. ° ° Subjective Assessment - 06/09/20 0933   ° Subjective Increased her activity over the weekend - states she did her execises and did some more walking down the hallway. No falls.   ° Pertinent History PD, chronic systolic CHF, diabetes, HTN, HLD   ° Limitations Walking   ° Patient Stated Goals wants to walk better   ° Currently in Pain? No/denies   ° Pain Onset In the past 7 days   °  °  °  ° ° ° ° ° ° ° ° ° °  Neuro Re-education: °  °Pt performs PWR! Moves in seated position  °  °PWR! Up for improved posture x 10 reps, with visual, verbal, tactile cues for posture, position, technique.  Cues for upright posture and scapular squeeze and spreading hands nice and big. Used cones as tactile cue as how far back to reach  hands to prevent incr posterior lean.  °  °PWR! Rock for improved weighshifting 2 x 5 reps each side, with visual and verbal cues - pt reaching up and in across body, cues for less ROM with RUE in order to decr shoulder discomfort.  °  °PWR! Twist for improved trunk rotation - due to pt limited in trunk rotation, performed a modified version with bringing hand to opposite knee and twisting and looking over shoulder 2 x 5 reps B, pt reporting no shoulder discomfort. Then attempted to perform with arms extended - first performing with wide BOS and pivoting through hips x10 reps B and then attempting adding in extended arms to twist and clap while pivoting through hips, with pt able to demo improved trunk rotation when twisting to L however pt reporting incr R shoulder pain/discomfort going to L. Provided modified PWR Twist as HEP.  °  °PWR! Step for improved step initiation-single limb step out and in, x 10 reps each side.   ° ° °Initiated as HEP and provided handout.  ° ° ° ° ° ° ° ° ° ° ° ° ° ° ° ° ° ° PT Education - 06/09/20 1003   ° Education Details seated PWR moves additions to HEP with modified PWR twist   ° Person(s) Educated Patient   ° Methods Explanation;Demonstration;Handout   ° Comprehension Verbalized understanding;Returned demonstration   °  °  °  ° ° ° PT Short Term Goals - 06/05/20 0901   °  ° PT SHORT TERM GOAL #1  ° Title Pt will be independent with initial HEP in order to build upon functional gains made in therapy. ALL STGS DUE 06/05/20   ° Baseline pt reports not consistently performing at home.   ° Time 4   ° Period Weeks   ° Status Not Met   ° Target Date 06/05/20   °  ° PT SHORT TERM GOAL #2  ° Title Pt will improve gait speed to at least 1.8 ft/sec with RW vs. straight cane with 3 prong tip in order to demo decr fall risk.   ° Baseline 1.54 ft/sec, 23.59 seconds = 1.39 ft/sec with rollator   ° Time 4   ° Period Weeks   ° Status Not Met   °  ° PT SHORT TERM GOAL #3  ° Title Pt will decr TUG  score to 16.5 seconds or less with SPC with 3 prong tip in order to demo decr fall risk.   ° Baseline 19.08 seconds, 28.16 with SPC with 3 prong tip and 32.25 seconds with rollator on 06/05/20   ° Time 4   °   Period Weeks    Status Not Met      PT SHORT TERM GOAL #4   Title Pt will perform 5x sit <> stand in 24 seconds or less with no UE support and no episodes of posterior bracing against chair.    Baseline 34.09 seconds with BUE support from mat table, proper technique, no episodes of BLE bracing    Time 4    Period Weeks    Status Not Met      PT SHORT TERM GOAL #5   Title Pt will verbalize understanding of fall prevention in the home.    Time 4    Period Weeks    Status New             PT Long Term Goals - 06/05/20 1253      PT LONG TERM GOAL #1   Title Pt will be independent with final HEP in order to build upon functional gains made in therapy. ALL LTGS DUE 07/03/20    Time 8    Period Weeks    Status New      PT LONG TERM GOAL #2   Title Pt will improve gait speed to at least 1.8 ft/sec with rollator  vs. straight cane with 3 prong tip in order to demo decr fall risk and improved gait efficiency    Baseline 1.39 ft/sec with rollator    Time 8    Period Weeks    Status Revised      PT LONG TERM GOAL #3   Title Pt will perform 5x sit <> stand in 24 seconds or less with no UE support and no episodes of posterior bracing against chair.    Baseline 27.22 seconds    Time 8    Period Weeks    Status New      PT LONG TERM GOAL #4   Title Pt will improve BERG score to at least a 50/56 in order to demo decr fall risk.    Baseline 44/56    Time 8    Period Weeks    Status New      PT LONG TERM GOAL #5   Title Pt will decr TUG score to 20 seconds or less with SPC with 3 prong tip vs. rollator in order to demo decr fall risk.    Baseline 28.16 with SPC with 3 prong tip and 32.25 seconds with rollator on 06/05/20    Time 8    Period Weeks    Status Revised                  Plan - 06/09/20 1009    Clinical Impression Statement Focus of today's skilled session was adding seated PWR moves to pt's HEP, performing modified PWR twist due to reports of R shoulder pain. Also modified seated PWR rock with reaching up and across body to pt's tolerance with R shoulder. Pt requesting to end session early due to reporting stomach not feeling well and originally wanting to cancel appt this morning. Will continue to progress towards LTGs.    Personal Factors and Comorbidities Comorbidity 3+;Past/Current Experience;Time since onset of injury/illness/exacerbation    Comorbidities PD, chronic systolic CHF, diabetes, HTN, HLD    Examination-Activity Limitations Locomotion Level;Transfers;Stairs    Examination-Participation Restrictions Community Activity;Shop    Stability/Clinical Decision Making Stable/Uncomplicated    Rehab Potential Good    PT Frequency 2x / week    PT Duration 8 weeks    PT Treatment/Interventions ADLs/Self  Care Home Management;DME Instruction;Gait training;Stair training;Functional mobility training;Therapeutic activities;Therapeutic exercise;Balance training;Neuromuscular re-education;Patient/family education;Vestibular;Energy conservation    PT Next Visit Plan how were seated PWR moves? standing balance work with weight shifting, fall prevention education. gait training and turning wtih gait; continue to encourage movement/activity at home.    Consulted and Agree with Plan of Care Patient           Patient will benefit from skilled therapeutic intervention in order to improve the following deficits and impairments:  Abnormal gait, Decreased balance, Decreased activity tolerance, Decreased strength, Difficulty walking, Impaired sensation, Decreased endurance  Visit Diagnosis: Other abnormalities of gait and mobility  Other symptoms and signs involving the nervous system  Muscle weakness (generalized)  Unsteadiness on  feet     Problem List Patient Active Problem List   Diagnosis Date Noted   Chronic rhinitis 12/18/2019   Chronic respiratory failure with hypoxia (HCC) 12/18/2019   Back pain 09/18/2019   Post-nasal drip 06/06/2019   Hyperlipidemia associated with type 2 diabetes mellitus ([place]) 03/05/2019   Contact dermatitis 03/05/2019   Cervical disc disorder with radiculopathy of cervical region 09/24/2018   Leg cramping 12/03/2017   Cognitive impairment 10/19/2017   Syncope    Osteopenia 07/10/2015   Mild persistent asthma in adult without complication 76/81/1572   NICM (nonischemic cardiomyopathy) ([facility]) 04/23/2014   Arthritis or polyarthritis, rheumatoid ([place]) 03/12/2014   ICD (implantable cardioverter-defibrillator), dual, in situ 03/06/2014   Thoracic or lumbosacral neuritis or radiculitis, unspecified 62/09/5595   Chronic systolic heart failure ([facility]) 05/31/2013   Parkinson disease ([facility]) 05/21/2013   Major depression 03/31/2013   Vertigo 03/03/2013   GERD (gastroesophageal reflux disease) 09/27/2012   Plantar fasciitis, bilateral 06/15/2012   Spondyloarthropathy 04/04/2012   Diabetic peripheral neuropathy ([facility]) 12/09/2011   Hyperlipidemia 12/09/2011   Chronic urticaria 05/27/2011   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 [place], [place], [address] Phone: [phone]   Fax:  [phone]  Name: Melane Windholz MRN: [id number] Date of Birth: 01-Jul-1951

## 2020-06-09 NOTE — Patient Instructions (Addendum)
° ° ° °  Modified PWR Twist - turning and looking over shoulder and bringing opposite hand to side of knee.

## 2020-06-12 ENCOUNTER — Ambulatory Visit: Payer: Medicare Other | Admitting: Physical Therapy

## 2020-06-12 ENCOUNTER — Telehealth: Payer: Self-pay | Admitting: Adult Health

## 2020-06-12 ENCOUNTER — Other Ambulatory Visit: Payer: Self-pay

## 2020-06-12 VITALS — BP 138/68 | HR 77

## 2020-06-12 DIAGNOSIS — R2689 Other abnormalities of gait and mobility: Secondary | ICD-10-CM | POA: Diagnosis not present

## 2020-06-12 DIAGNOSIS — R29818 Other symptoms and signs involving the nervous system: Secondary | ICD-10-CM | POA: Diagnosis not present

## 2020-06-12 DIAGNOSIS — M6281 Muscle weakness (generalized): Secondary | ICD-10-CM | POA: Diagnosis not present

## 2020-06-12 DIAGNOSIS — R2681 Unsteadiness on feet: Secondary | ICD-10-CM | POA: Diagnosis not present

## 2020-06-12 NOTE — Telephone Encounter (Signed)
Called and spoke with Patient.  Patient stated she was placed on O2 at night and has notice she is having increased shortness of breath. Patient is concerned she may need O2 during day. Patient's Physical Therapist was with Patient when I called.  Chole, PT stated Patient is having some shortness of breath with exertion. Chole stated Patient sats were 100% on room air at the start of therapy today, and was 96% on room air after some of the exercises. Chole stated Patient stopped and rested when needed, and there were not signs of distress. Chole stated rest helped and advised Patient to rest when needed. Chole stated she checks sats periodically during sessions.   Message routed to [place], NP

## 2020-06-12 NOTE — Telephone Encounter (Signed)
Called and spoke with patient, she would prefer to come into the office for an evaluation next week.  Visit scheduled with Rexene Edison NP for Monday at 9:30 am, advised to arrive by 9:15 am for check in.  She verbalized understanding.

## 2020-06-12 NOTE — Therapy (Signed)
[facility] [address] [place], [place], [address] Phone: [phone]   Fax:  [phone]  Physical Therapy Treatment  Patient Details  Name: Veronica Shaw MRN: [id number] Date of Birth: 13-Sep-1950 Referring Provider (PT): Alonza Bogus, DO   Encounter Date: 06/12/2020   PT End of Session - 06/12/20 1215    Visit Number 8    Number of Visits 17    Date for PT Re-Evaluation 08/04/20   60 day POC, 90 day cert   Authorization Type Medicare    Progress Note Due on Visit 10    PT Start Time 2951    PT Stop Time 1058    PT Time Calculation (min) 43 min    Equipment Utilized During Treatment Gait belt    Activity Tolerance Patient tolerated treatment well    Behavior During Therapy WFL for tasks assessed/performed           Past Medical History:  Diagnosis Date  . Asthma   . Chronic systolic CHF (congestive heart failure) ([place])    a. cMRI 4/15: EF 34% and findings - c/w NICM, normal RV size and function (RVEF 61%), Mild MR // b. Echo 2/15:  EF 30-35%, diff HK, ant-sept AK, Gr 2 DD, mild MR, trivial TR  //  c. Echo 5/17: EF 20-25%, severe diffuse HK, marked systolic dyssynchrony, grade 1 diastolic dysfunction, mild MR  //  d. RHC 5/17: Fick CO 2.9, RVSP 19, PASP 15, PW mean 2, low filing pressures and preserved CO   . Diabetes mellitus   . Flu 10/17/2017  . Gastritis   . History of echocardiogram    Echo 6/18: EF 30-35, diffuse HK, grade 1 diastolic dysfunction, trivial MR, mild LAE, mild TR, no pericardial effusion  . History of nuclear stress test    Myoview 5/18: EF 49, no ischemia, inferoseptal defect c/w LBBB artifact (intermediate risk due to EF < 50).  [name] Kitchen HTN (hypertension)   . Hyperlipidemia   . NICM (nonischemic cardiomyopathy) ([place])    a. Nuclear 5/13: Normal stress nuclear study. LV Ejection Fraction: 58%  //  b. LHC 10/14: Minor luminal irregularity in prox LAD, EF 35%   . Parkinson disease ([place])   .  Plantar fasciitis   . Sleep apnea    was retested and no longer had it and so d/c CPAP  . Urticaria     Past Surgical History:  Procedure Laterality Date  . BREAST EXCISIONAL BIOPSY Left 1970   benign cyst  . BREAST SURGERY Left   . BUNIONECTOMY    . CARDIAC CATHETERIZATION N/A 12/16/2015   Procedure: Right Heart Cath;  Surgeon: Sherren Mocha, MD;  Location: [facility];  Service: Cardiovascular;  Laterality: N/A;  . CHOLECYSTECTOMY    . eye lid surgery Bilateral 05/09/2019  . IMPLANTABLE CARDIOVERTER DEFIBRILLATOR IMPLANT  11-25-13   MDT dual chamber ICD implanted by [name] for primary prevention  . IMPLANTABLE CARDIOVERTER DEFIBRILLATOR IMPLANT N/A 11/25/2013   Procedure: IMPLANTABLE CARDIOVERTER DEFIBRILLATOR IMPLANT;  Surgeon: [name], MD;  Location: [facility] C[facility] LAB;  Service: Cardiovascular;  Laterality: N/A;  . LEFT AND RIGHT HEART CATHETERIZATION WITH CORONARY ANGIOGRAM N/A 05/31/2013   Procedure: LEFT AND RIGHT HEART CATHETERIZATION WITH CORONARY ANGIOGRAM;  Surgeon: [name], MD;  Location: [facility];  Service: Cardiovascular;  Laterality: N/A;  . TONSILLECTOMY    . TUBAL LIGATION      Vitals:   06/12/20 1019  BP: 138/68  Pulse: 77  SpO2: 100%     Subjective Assessment - 06/12/20 1016    Subjective Feeling a little bit more tired today. Didn't sleep well last night. Tried the exercises at home - they are going well. Feels like she is getting short of breath in the daytime - sent a message to her pulmonologist and waiting to hear back.    Pertinent History PD, chronic systolic CHF, diabetes, HTN, HLD    Limitations Walking    Patient Stated Goals wants to walk better    Pain Onset In the past 7 days                             [facility] Adult PT Treatment/Exercise - 06/12/20 0001      Ambulation/Gait   Ambulation/Gait Yes    Ambulation/Gait Assistance 5: Supervision    Ambulation/Gait Assistance Details throughout session -  cues for posture    Assistive device Rollator    Gait Pattern Step-through pattern;Decreased step length - right;Decreased step length - left;Antalgic;Narrow base of support    Ambulation Surface Level;Indoor      Therapeutic Activites    Therapeutic Activities Other Therapeutic Activities    Other Therapeutic Activities pt's pulmonologist's office called back during session, pt speaking on phone and PT also letting them know of pt's vitals during session today      Exercises   Exercises Other Exercises    Other Exercises  reviewed seated hamstring stretch from HEP, cues for proper technique 2 x 30 seconds B               Balance Exercises - 06/12/20 0001      Balance Exercises: Standing   SLS Intermittent upper extremity support;Eyes open;Solid surface;Limitations    SLS Limitations at counter, 4 X 10 seconds B, incr difficulty with LLE with intermittent fingertip support - cues for glute activation   Stepping Strategy Posterior;UE support;10 reps;Limitations    Stepping Strategy Limitations cues for weight shift and foot clearance x10 reps B    Retro Gait 3 reps;Limitations    Retro Gait Limitations retro gait with no UE support at countertop, cues for step length, min guard/min A for balance    Sidestepping Upper extremity support;3 reps;Limitations    Sidestepping Limitations down and back 3 reps with fingertip support, cues for incr foot clearance    Marching Upper extremity assist 1;Limitations    Marching Limitations down and back at countertop x3 reps, cues for incr foot clearance and SLS    Other Standing Exercises with BUE support: heel toe raises x10 reps, staggered stance weight shifting x10 reps B - with manual and verbal cues for proper technique esp shifting weight posteriorly , lateral BOS weight shifting x10 reps B with pushing through toes then performing dynamic SLS by lifting up opposite leg with BUE support x10 reps each               PT Short Term Goals  - 06/05/20 0901      PT SHORT TERM GOAL #1   Title Pt will be independent with initial HEP in order to build upon functional gains made in therapy. ALL STGS DUE 06/05/20    Baseline pt reports not consistently performing at home.    Time 4    Period Weeks    Status Not Met    Target Date 06/05/20      PT SHORT TERM GOAL #2  Title Pt will improve gait speed to at least 1.8 ft/sec with RW vs. straight cane with 3 prong tip in order to demo decr fall risk.    Baseline 1.54 ft/sec, 23.59 seconds = 1.39 ft/sec with rollator    Time 4    Period Weeks    Status Not Met      PT SHORT TERM GOAL #3   Title Pt will decr TUG score to 16.5 seconds or less with SPC with 3 prong tip in order to demo decr fall risk.    Baseline 19.08 seconds, 28.16 with SPC with 3 prong tip and 32.25 seconds with rollator on 06/05/20    Time 4    Period Weeks    Status Not Met      PT SHORT TERM GOAL #4   Title Pt will perform 5x sit <> stand in 24 seconds or less with no UE support and no episodes of posterior bracing against chair.    Baseline 34.09 seconds with BUE support from mat table, proper technique, no episodes of BLE bracing    Time 4    Period Weeks    Status Not Met      PT SHORT TERM GOAL #5   Title Pt will verbalize understanding of fall prevention in the home.    Time 4    Period Weeks    Status New             PT Long Term Goals - 06/05/20 1253      PT LONG TERM GOAL #1   Title Pt will be independent with final HEP in order to build upon functional gains made in therapy. ALL LTGS DUE 07/03/20    Time 8    Period Weeks    Status New      PT LONG TERM GOAL #2   Title Pt will improve gait speed to at least 1.8 ft/sec with rollator  vs. straight cane with 3 prong tip in order to demo decr fall risk and improved gait efficiency    Baseline 1.39 ft/sec with rollator    Time 8    Period Weeks    Status Revised      PT LONG TERM GOAL #3   Title Pt will perform 5x sit <> stand in 24  seconds or less with no UE support and no episodes of posterior bracing against chair.    Baseline 27.22 seconds    Time 8    Period Weeks    Status New      PT LONG TERM GOAL #4   Title Pt will improve BERG score to at least a 50/56 in order to demo decr fall risk.    Baseline 44/56    Time 8    Period Weeks    Status New      PT LONG TERM GOAL #5   Title Pt will decr TUG score to 20 seconds or less with SPC with 3 prong tip vs. rollator in order to demo decr fall risk.    Baseline 28.16 with SPC with 3 prong tip and 32.25 seconds with rollator on 06/05/20    Time 8    Period Weeks    Status Revised                 Plan - 06/12/20 1217    Clinical Impression Statement Pt reporting feeling more SOB recently- pt called this morning to let her pulmonologist know. Pt's O2 sats at beginning of  session at 100% and remaining at 95-96% after activity. Today's skilled session focused on standing balance strategies at countertop with decr UE support as appropriate. Pt reporting intermittent dizziness during standing balance - seemed to happen more after looking down, but subsided within seconds. Will continue to monitor. Will continue to progress towards LTGs.    Personal Factors and Comorbidities Comorbidity 3+;Past/Current Experience;Time since onset of injury/illness/exacerbation    Comorbidities PD, chronic systolic CHF, diabetes, HTN, HLD    Examination-Activity Limitations Locomotion Level;Transfers;Stairs    Examination-Participation Restrictions Community Activity;Shop    Stability/Clinical Decision Making Stable/Uncomplicated    Rehab Potential Good    PT Frequency 2x / week    PT Duration 8 weeks    PT Treatment/Interventions ADLs/Self Care Home Management;DME Instruction;Gait training;Stair training;Functional mobility training;Therapeutic activities;Therapeutic exercise;Balance training;Neuromuscular re-education;Patient/family education;Vestibular;Energy conservation    PT  Next Visit Plan SciFit/NuStep. how were seated PWR moves? standing balance work with weight shifting, fall prevention education. gait training and turning wtih gait; continue to encourage movement/activity at home.    Consulted and Agree with Plan of Care Patient           Patient will benefit from skilled therapeutic intervention in order to improve the following deficits and impairments:  Abnormal gait, Decreased balance, Decreased activity tolerance, Decreased strength, Difficulty walking, Impaired sensation, Decreased endurance  Visit Diagnosis: Other abnormalities of gait and mobility  Other symptoms and signs involving the nervous system  Muscle weakness (generalized)  Unsteadiness on feet     Problem List Patient Active Problem List   Diagnosis Date Noted  . Chronic rhinitis 12/18/2019  . Chronic respiratory failure with hypoxia ([place]) 12/18/2019  . Back pain 09/18/2019  . Post-nasal drip 06/06/2019  . Hyperlipidemia associated with type 2 diabetes mellitus ([place]) 03/05/2019  . Contact dermatitis 03/05/2019  . Cervical disc disorder with radiculopathy of cervical region 09/24/2018  . Leg cramping 12/03/2017  . Cognitive impairment 10/19/2017  . Syncope   . Osteopenia 07/10/2015  . Mild persistent asthma in adult without complication 40/98/1191  . NICM (nonischemic cardiomyopathy) ([place]) 04/23/2014  . Arthritis or polyarthritis, rheumatoid ([place]) 03/12/2014  . ICD (implantable cardioverter-defibrillator), dual, in situ 03/06/2014  . Thoracic or lumbosacral neuritis or radiculitis, unspecified 07/03/2013  . Chronic systolic heart failure ([place]) 05/31/2013  . Parkinson disease ([place]) 05/21/2013  . Major depression 03/31/2013  . Vertigo 03/03/2013  . GERD (gastroesophageal reflux disease) 09/27/2012  . Plantar fasciitis, bilateral 06/15/2012  . 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 Diabetic peripheral neuropathy ([place]) 12/09/2011  . Hyperlipidemia 12/09/2011  . Chronic  urticaria 05/27/2011  . 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 05/20/2011  . ROSACEA 05/29/2009  . ACHILLES BURSITIS OR TENDINITIS 03/27/2009  . Type II diabetes mellitus with complication ([place]) 47/82/9562  . Essential hypertension 09/28/2006    Arliss Journey, PT, DPT  06/12/2020, 12:23 PM  [facility] [address] [place], [place], [address] Phone: [phone]   Fax:  [phone]  Name: Soliana Kitko MRN: [id number] Date of Birth: [birth year]

## 2020-06-12 NOTE — Telephone Encounter (Signed)
Sorry to hear that she is having trouble breathing.  It is good that her oxygen level is not dropping.  She does not need oxygen during the daytime with O2 saturations at 96 to 100%. If she is having work breathing problems and would like to come in for an evaluation I am glad to work her in my schedule today. Or we can see her next week.  Please contact office for sooner follow up if symptoms do not improve or worsen or seek emergency care

## 2020-06-12 NOTE — Telephone Encounter (Signed)
Lm for patient.  

## 2020-06-15 ENCOUNTER — Ambulatory Visit (INDEPENDENT_AMBULATORY_CARE_PROVIDER_SITE_OTHER): Payer: Medicare Other | Admitting: Adult Health

## 2020-06-15 ENCOUNTER — Other Ambulatory Visit: Payer: Self-pay

## 2020-06-15 ENCOUNTER — Encounter: Payer: Self-pay | Admitting: Adult Health

## 2020-06-15 ENCOUNTER — Ambulatory Visit (INDEPENDENT_AMBULATORY_CARE_PROVIDER_SITE_OTHER): Payer: Medicare Other

## 2020-06-15 VITALS — BP 110/66 | HR 89 | Temp 97.0°F | Ht 61.5 in | Wt 135.2 lb

## 2020-06-15 DIAGNOSIS — G4734 Idiopathic sleep related nonobstructive alveolar hypoventilation: Secondary | ICD-10-CM | POA: Diagnosis not present

## 2020-06-15 DIAGNOSIS — J9611 Chronic respiratory failure with hypoxia: Secondary | ICD-10-CM | POA: Diagnosis not present

## 2020-06-15 DIAGNOSIS — J4530 Mild persistent asthma, uncomplicated: Secondary | ICD-10-CM

## 2020-06-15 DIAGNOSIS — J45909 Unspecified asthma, uncomplicated: Secondary | ICD-10-CM | POA: Diagnosis not present

## 2020-06-15 MED ORDER — PREDNISONE 20 MG PO TABS: 20.0000 mg | ORAL_TABLET | Freq: Every day | ORAL | 0 refills | Status: DC

## 2020-06-15 NOTE — Assessment & Plan Note (Signed)
Patient remains on nocturnal oxygen 2 L.  She has had no desaturations with rest or with activity. Patient will be traveling soon and needs oxygen concentrator while visiting her son in [place]. Order sent to DME company to help facilitate this

## 2020-06-15 NOTE — Assessment & Plan Note (Signed)
Mild to moderate persistent asthma with mild exacerbation.  Check chest x-ray today.  Will use short course of prednisone 20 mg for 5 days.  Have asked her to increase her Symbicort up to twice a day.  Plan  Patient Instructions  Chest xray today.  Prednisone 20mg  daily for 5 days , take with food.  Continue on Oxygen 2l/m At bedtime.  Order to DME for Oxygen for vacation in [place].  Saline nasal spray Twice daily  As needed   Saline nasal Gel At bedtime  .  Flonase daily .  Increase Symbicort 2 puffs Twice daily  , rinse after use.  Use Albuterol  As needed for wheezing and shortness of breath  Activity as tolerated.  Follow up in  2-3 months with Dr. Vaughan Browner or Alvenia Treese NP and As needed   Please contact office for sooner follow up if symptoms do not improve or worsen or seek emergency care

## 2020-06-15 NOTE — Progress Notes (Signed)
@Patient  ID: Veronica Shaw, female    DOB: [birth year], 69 y.o.   MRN: [id number]  Chief Complaint  Patient presents with  . Acute Visit    ASthma     Referring provider: [name], DO  HPI: 69 year old female never smoker followed for moderate persistent asthma and upper airway cough syndrome Patient has multiple medical comorbidities including congestive heart failure, nonischemic cardiomyopathy, status post pacemaker, osteoarthritis (questionable rheumatoid arthritis), Parkinson's disease, chronic kidney disease stage III, diabetes, GERD and depression  TEST/EVENTS :  Spirometry 01/05/15  FEV1  0.98 (46%) ratio 62  - - PFTs 02/17/15  FEV1  1.97 ( 93%) ratio 79 p 17% improvement from saba and dlco 107  - 08/19/2015  Walked RA x 3 laps @ 185 ft each stopped due to End of study, nl pace, no sob or desat   - NO 08/19/2015 = 79  - Spirometry 08/19/2015  FEV1 1.32 (65%)  Ratio 71 p am dulera 200 x one puff   2021 Sleep study showed no significant sleep apnea. Her AHI was 2.9/hour. She did have some mild desaturations with an SaO2 low at 86%.  2021 ONO + desats   2D echo May 2017 EF 20 to 82%, marked systolic dyssynchrony, grade 1 diastolic dysfunction  2D echo June 2018 EF 30 to 35%, diffuse hypokinesis, grade 1 diastolic dysfunction  2D echo March 2019 EF 60 to 50%, grade 2 diastolic dysfunction.   06/15/2020 Acute OV: Asthma , chronic respiratory failure with nocturnal O2  Patient presents for an acute office visit, patient complains over last 2 weeks breathing has not been as good.  Slightly more cough and tightness.  No fever, discolored mucus, chest pain orthopnea PND.  Patient does get short of breath with activities seems to be worse first thing in the morning time. Patient is participating in physical therapy.  O2 saturations on room air are 100% at rest and 96% on room air with exercises. Denies any increased albuterol use.  She remains on Symbicort but only using  it once a day.  Patient remains on oxygen 2 L at bedtime.  Says it does make her feel better.  She feels like it is really helping her. Patient is traveling to [place] for vacation to see her son.  Would like oxygen to be set up there while she was there for 2 weeks.  She is unsure of the dates at this time.    Allergies  Allergen Reactions  . Aspirin Swelling and Other (See Comments)    Other reaction(s): Other (See Comments) Causes nose bleeds *ONLY THE COATED ASA* Causes nose bleeds *ONLY THE COATED ASA*  . Penicillins Shortness Of Breath and Rash    Shortness of Breath - Throat felt like it was closing.   [name] [Conj Estrog-Medroxyprogest Ace] Shortness Of Breath    Throat swelling Throat swelling  . Ace Inhibitors Cough  . Simvastatin Other (See Comments) and Rash    Muscle aches    Immunization History  Administered Date(s) Administered  . Fluad Quad(high Dose 65+) 05/25/2020  . Influenza Split 06/17/2011, 04/18/2012  . Influenza Whole 05/17/2007, 06/14/2010  . Influenza,inj,Quad PF,6+ Mos 05/20/2014, 06/04/2015, 03/22/2016, 05/30/2017, 04/12/2018  . Influenza,inj,Quad PF,6-35 Mos 05/03/2013  . Influenza-Unspecified 04/01/2019  . PFIZER SARS-COV-2 Vaccination 09/05/2019, 10/01/2019  . Pneumococcal Conjugate-13 05/20/2014  . Pneumococcal Polysaccharide-23 05/17/2007, 06/01/2013, 03/05/2019  . Td 03/01/2006  . Zoster 08/01/2009    Past Medical History:  Diagnosis Date  . Asthma   .  Chronic systolic CHF (congestive heart failure) ([place])    a. cMRI 4/15: EF 34% and findings - c/w NICM, normal RV size and function (RVEF 61%), Mild MR // b. Echo 2/15:  EF 30-35%, diff HK, ant-sept AK, Gr 2 DD, mild MR, trivial TR  //  c. Echo 5/17: EF 20-25%, severe diffuse HK, marked systolic dyssynchrony, grade 1 diastolic dysfunction, mild MR  //  d. RHC 5/17: Fick CO 2.9, RVSP 19, PASP 15, PW mean 2, low filing pressures and preserved CO   . Diabetes mellitus   . Flu 10/17/2017  .  Gastritis   . History of echocardiogram    Echo 6/18: EF 30-35, diffuse HK, grade 1 diastolic dysfunction, trivial MR, mild LAE, mild TR, no pericardial effusion  . History of nuclear stress test    Myoview 5/18: EF 49, no ischemia, inferoseptal defect c/w LBBB artifact (intermediate risk due to EF < 50).  [name] Kitchen HTN (hypertension)   . Hyperlipidemia   . NICM (nonischemic cardiomyopathy) ([place])    a. Nuclear 5/13: Normal stress nuclear study. LV Ejection Fraction: 58%  //  b. LHC 10/14: Minor luminal irregularity in prox LAD, EF 35%   . Parkinson disease ([place])   . Plantar fasciitis   . Sleep apnea    was retested and no longer had it and so d/c CPAP  . Urticaria     Tobacco History: Social History   Tobacco Use  Smoking Status Never Smoker  Smokeless Tobacco Never Used  Tobacco Comment   exposed to smoke during child hood (parents)   Counseling given: Not Answered Comment: exposed to smoke during child hood (parents)   Outpatient Medications Prior to Visit  Medication Sig Dispense Refill  . albuterol (PROVENTIL HFA;VENTOLIN HFA) 108 (90 Base) MCG/ACT inhaler Inhale 2 puffs into the lungs every 4 (four) hours as needed for wheezing or shortness of breath. 18 g 3  . Biotin 5000 MCG CAPS Take 1 tablet by mouth daily.     . budesonide-formoterol (SYMBICORT) 160-4.5 MCG/ACT inhaler Inhale 2 puffs into the lungs 2 (two) times daily. 1 Inhaler 0  . calcium-vitamin D (OSCAL WITH D) 500-200 MG-UNIT tablet Take 1 tablet by mouth.    . carbidopa-levodopa (SINEMET CR) 50-200 MG tablet Take 1 tablet by mouth at bedtime. 90 tablet 1  . carbidopa-levodopa (SINEMET IR) 25-100 MG tablet Take 1 tablet by mouth 3 (three) times daily. 270 tablet 1  . famotidine (PEPCID) 20 MG tablet TAKE 1 TABLET TWICE DAILY 180 tablet 2  . fluticasone (FLONASE SENSIMIST) 27.5 MCG/SPRAY nasal spray Place 2 sprays into the nose daily. 10 g 1  . furosemide (LASIX) 40 MG tablet TAKE 1/2 TABLET EVERY DAY AS NEEDED FOR   FLUID/SWELLING 30 tablet 3  . levalbuterol (XOPENEX) 0.63 MG/3ML nebulizer solution 1 vial every 4-6 hours as needed for wheezing and shortness of breath. 180 mL 5  . losartan (COZAAR) 25 MG tablet TAKE 1 TABLET EVERY DAY 90 tablet 3  . meclizine (ANTIVERT) 12.5 MG tablet Take 0.5 tablets (6.25 mg total) by mouth 3 (three) times daily as needed for dizziness. 30 tablet 0  . metFORMIN (GLUCOPHAGE) 500 MG tablet Take 2 tablets (1,000 mg total) by mouth 2 (two) times daily with a meal. 180 tablet 1  . metoprolol succinate (TOPROL-XL) 50 MG 24 hr tablet TAKE 1 TABLET TWICE DAILY 180 tablet 2  . potassium chloride (KLOR-CON) 10 MEQ tablet Take 1 tablet (10 mEq total) by mouth as needed (take with  lasix as needed for swelling). 30 tablet 2  . spironolactone (ALDACTONE) 25 MG tablet TAKE 1/2 TABLET EVERY DAY 45 tablet 3  . Turmeric 500 MG TABS Take 1 tablet by mouth daily.    [name] Kitchen gabapentin (NEURONTIN) 100 MG capsule Take 300mg  (3 pills) at night before bed and 100mg  (1 pill) in the morning with breakfast. (Patient not taking: Reported on 06/15/2020) 120 capsule 1  . methocarbamol (ROBAXIN) 750 MG tablet Take 750 mg by mouth as needed.  (Patient not taking: Reported on 06/15/2020)    . Calcium Carb-Cholecalciferol 600-800 MG-UNIT TABS Take 1 tablet by mouth daily. (Patient not taking: Reported on 06/15/2020) 90 tablet 3   No facility-administered medications prior to visit.     Review of Systems:   Constitutional:   No  weight loss, night sweats,  Fevers, chills,  +fatigue, or  lassitude.  HEENT:   No headaches,  Difficulty swallowing,  Tooth/dental problems, or  Sore throat,                No sneezing, itching, ear ache, nasal congestion, post nasal drip,   CV:  No chest pain,  Orthopnea, PND, swelling in lower extremities, anasarca, dizziness, palpitations, syncope.   GI  No heartburn, indigestion, abdominal pain, nausea, vomiting, diarrhea, change in bowel habits, loss of appetite, bloody  stools.   Resp:   No chest wall deformity  Skin: no rash or lesions.  GU: no dysuria, change in color of urine, no urgency or frequency.  No flank pain, no hematuria   MS:  No joint pain or swelling.  No decreased range of motion.  No back pain.    Physical Exam  BP 110/66 (BP Location: Left Arm, Patient Position: Sitting, Cuff Size: Normal)   Pulse 89   Temp (!) 97 F (36.1 C) (Temporal)   Ht 5' 1.5" (1.562 m)   Wt 135 lb 3.2 oz (61.3 kg)   LMP 08/01/2000 (Approximate)   SpO2 98%   BMI 25.13 kg/m   GEN: A/Ox3; pleasant , NAD, well nourished    HEENT:  [place]/AT,  , NOSE-clear, THROAT-clear, no lesions, no postnasal drip or exudate noted.   NECK:  Supple w/ fair ROM; no JVD; normal carotid impulses w/o bruits; no thyromegaly or nodules palpated; no lymphadenopathy.    RESP  Clear  P & A; w/o, wheezes/ rales/ or rhonchi. no accessory muscle use, no dullness to percussion  CARD:  RRR, no m/r/g, no peripheral edema, pulses intact, no cyanosis or clubbing.  GI:   Soft & nt; nml bowel sounds; no organomegaly or masses detected.   Musco: Warm bil, no deformities or joint swelling noted.   Neuro: alert, no focal deficits noted.    Skin: Warm, no lesions or rashes    Lab Results:  CBC    Component Value Date/Time   WBC 6.6 02/04/2020 1722   WBC 9.6 01/30/2019 0941   RBC 4.38 02/04/2020 1722   RBC 4.42 01/30/2019 0941   HGB 13.3 02/04/2020 1722   HCT 39.1 02/04/2020 1722   PLT 224 02/04/2020 1722   MCV 89 02/04/2020 1722   MCH 30.4 02/04/2020 1722   MCH 30.1 01/30/2019 0941   MCHC 34.0 02/04/2020 1722   MCHC 33.5 01/30/2019 0941   RDW 12.2 02/04/2020 1722   LYMPHSABS 1.8 02/04/2020 1722   MONOABS 0.5 10/17/2017 1215   EOSABS 0.5 (H) 02/04/2020 1722   BASOSABS 0.1 02/04/2020 1722    BMET    Component Value Date/Time  NA 141 03/31/2020 0933   K 3.8 03/31/2020 0933   CL 99 03/31/2020 0933   CO2 25 03/31/2020 0933   GLUCOSE 146 (H) 03/31/2020 0933    GLUCOSE 139 (H) 01/30/2019 0941   BUN 18 03/31/2020 0933   CREATININE 1.13 (H) 03/31/2020 0933   CREATININE 1.44 (H) 01/30/2019 0941   CALCIUM 10.1 03/31/2020 0933   GFRNONAA 49 (L) 03/31/2020 0933   GFRNONAA 37 (L) 01/30/2019 0941   GFRAA 57 (L) 03/31/2020 0933   GFRAA 43 (L) 01/30/2019 0941    BNP    Component Value Date/Time   BNP 65.1 02/04/2020 1722   BNP 26.9 10/15/2017 1820    ProBNP    Component Value Date/Time   PROBNP 310 (H) 08/15/2018 1036   PROBNP 52.0 12/09/2014 1109    Imaging: DG Chest 2 View  Result Date: 06/15/2020 CLINICAL DATA:  Asthma EXAM: CHEST - 2 VIEW COMPARISON:  August 27, 2018 FINDINGS: There are occasional small calcified granulomas. Lungs otherwise are clear. Heart size and pulmonary vascularity are normal. Pacemaker leads are attached to the right atrium and right ventricle. No adenopathy. There is degenerative change in the thoracic spine. IMPRESSION: Occasional small calcified granulomas. No edema or airspace opacity. Heart size normal. Pacemaker leads attached to right atrium and right ventricle. Electronically Signed   By: Lowella Grip III M.D.   On: 06/15/2020 10:28   CUP PACEART REMOTE DEVICE CHECK  Result Date: 05/28/2020 Scheduled remote reviewed. Normal device function.  Next remote 91 days. Kathy Breach, RN, CCDS, [employer]     PFT Results Latest Ref Rng & Units 02/17/2015  FVC-Pre L 2.23  FVC-Predicted Pre % 80  FVC-Post L 2.49  FVC-Predicted Post % 89  Pre FEV1/FVC % % 75  Post FEV1/FCV % % 79  FEV1-Pre L 1.67  FEV1-Predicted Pre % 79  FEV1-Post L 1.97  DLCO uncorrected ml/min/mmHg 21.77  DLCO UNC% % 107  DLVA Predicted % 117  TLC L 4.32  TLC % Predicted % 93  RV % Predicted % 70    Lab Results  Component Value Date   NITRICOXIDE 82 08/27/2018        Assessment & Plan:   Mild persistent asthma in adult without complication Mild to moderate persistent asthma with mild exacerbation.  Check  chest x-ray today.  Will use short course of prednisone 20 mg for 5 days.  Have asked her to increase her Symbicort up to twice a day.  Plan  Patient Instructions  Chest xray today.  Prednisone 20mg  daily for 5 days , take with food.  Continue on Oxygen 2l/m At bedtime.  Order to DME for Oxygen for vacation in [place].  Saline nasal spray Twice daily  As needed   Saline nasal Gel At bedtime  .  Flonase daily .  Increase Symbicort 2 puffs Twice daily  , rinse after use.  Use Albuterol  As needed for wheezing and shortness of breath  Activity as tolerated.  Follow up in  2-3 months with Dr. Vaughan Browner or Hannie Shoe NP and As needed   Please contact office for sooner follow up if symptoms do not improve or worsen or seek emergency care        Chronic respiratory failure with hypoxia [facility]) Patient remains on nocturnal oxygen 2 L.  She has had no desaturations with rest or with activity. Patient will be traveling soon and needs oxygen concentrator while visiting her son in [place]. Order sent to [facility]  to help facilitate this     Rexene Edison, NP 06/15/2020

## 2020-06-15 NOTE — Patient Instructions (Addendum)
Chest xray today.  Prednisone 20mg  daily for 5 days , take with food.  Continue on Oxygen 2l/m At bedtime.  Order to DME for Oxygen for vacation in [place].  Saline nasal spray Twice daily  As needed   Saline nasal Gel At bedtime  .  Flonase daily .  Increase Symbicort 2 puffs Twice daily  , rinse after use.  Use Albuterol  As needed for wheezing and shortness of breath  Activity as tolerated.  Follow up in  2-3 months with Dr. Vaughan Browner or Ragena Fiola NP and As needed   Please contact office for sooner follow up if symptoms do not improve or worsen or seek emergency care

## 2020-06-16 ENCOUNTER — Ambulatory Visit: Payer: Medicare Other | Admitting: Physical Therapy

## 2020-06-19 ENCOUNTER — Ambulatory Visit: Payer: Medicare Other | Admitting: Physical Therapy

## 2020-06-22 ENCOUNTER — Ambulatory Visit (INDEPENDENT_AMBULATORY_CARE_PROVIDER_SITE_OTHER): Payer: Medicare Other

## 2020-06-22 DIAGNOSIS — I5022 Chronic systolic (congestive) heart failure: Secondary | ICD-10-CM

## 2020-06-22 DIAGNOSIS — Z95810 Presence of automatic (implantable) cardiac defibrillator: Secondary | ICD-10-CM | POA: Diagnosis not present

## 2020-06-23 ENCOUNTER — Other Ambulatory Visit: Payer: Self-pay

## 2020-06-23 ENCOUNTER — Encounter: Payer: Self-pay | Admitting: Physical Therapy

## 2020-06-23 ENCOUNTER — Ambulatory Visit: Payer: Medicare Other | Admitting: Physical Therapy

## 2020-06-23 DIAGNOSIS — R2681 Unsteadiness on feet: Secondary | ICD-10-CM

## 2020-06-23 DIAGNOSIS — R2689 Other abnormalities of gait and mobility: Secondary | ICD-10-CM

## 2020-06-23 DIAGNOSIS — R29818 Other symptoms and signs involving the nervous system: Secondary | ICD-10-CM

## 2020-06-23 DIAGNOSIS — M6281 Muscle weakness (generalized): Secondary | ICD-10-CM | POA: Diagnosis not present

## 2020-06-23 NOTE — Progress Notes (Signed)
EPIC Encounter for ICM Monitoring  Patient Name: Veronica Shaw is a 69 y.o. female Date: 06/23/2020 Primary Care Physican: [name], DO Primary Cardiologist:Cooper/Weaver PA Electrophysiologist: Lovena Le 11/15/2021OfficeWeight: 135lbs  Spoke with patient and reports feeling well at this time.  Denies fluid symptoms.    OptiVolThoracic impedancenormal.  Prescribed:Furosemide40 mgTake 0.5 tablets (20 mg total) by moutheverydayas needed for fluid (For swelling).   Labs: 03/31/2020 Creatinine 1.13, BUN 18, Potassium 3.8, Sodium 141, GFR 49-57 02/04/2020 Creatinine1.33, BUN21, Potassium4.8, [id number], [id number] 11/01/2019 Creatinine1.13, BUN19, Potassium4.2, Sodium143, PJR93-96 A complete set of results can be found in Results Review.  Recommendations: No changes and encouraged to call if experiencing any fluid symptoms.  Follow-up plan: ICM clinic phone appointment on12/27/2021. 91 day device clinic remote transmission1/27/2022.   EP/Cardiology Office Visits:Recalls 4/2/2022with Dr. Burt Knack and 02/05/2021 for [name].   Copy of ICM check sent to Dr.Taylor.   3 month ICM trend: 06/22/2020    1 Year ICM trend:       [name], RN 06/23/2020 2:53 PM

## 2020-06-23 NOTE — Therapy (Signed)
[facility] [address] [place], [place], [address] Phone: [phone]   Fax:  [phone]  Physical Therapy Treatment  Patient Details  Name: Veronica Shaw MRN: [id number] Date of Birth: Nov 14, 1950 Referring Provider (PT): Alonza Bogus, DO   Encounter Date: 06/23/2020   PT End of Session - 06/23/20 1311    Visit Number 9    Number of Visits 17    Date for PT Re-Evaluation 08/04/20   60 day POC, 90 day cert   Authorization Type Medicare    Progress Note Due on Visit 10    PT Start Time 0933    PT Stop Time 1014    PT Time Calculation (min) 41 min    Equipment Utilized During Treatment Gait belt    Activity Tolerance Patient tolerated treatment well    Behavior During Therapy [facility] for tasks assessed/performed           Past Medical History:  Diagnosis Date  . Asthma   . Chronic systolic CHF (congestive heart failure) ([place])    a. cMRI 4/15: EF 34% and findings - c/w NICM, normal RV size and function (RVEF 61%), Mild MR // b. Echo 2/15:  EF 30-35%, diff HK, ant-sept AK, Gr 2 DD, mild MR, trivial TR  //  c. Echo 5/17: EF 20-25%, severe diffuse HK, marked systolic dyssynchrony, grade 1 diastolic dysfunction, mild MR  //  d. RHC 5/17: Fick CO 2.9, RVSP 19, PASP 15, PW mean 2, low filing pressures and preserved CO   . Diabetes mellitus   . Flu 10/17/2017  . Gastritis   . History of echocardiogram    Echo 6/18: EF 30-35, diffuse HK, grade 1 diastolic dysfunction, trivial MR, mild LAE, mild TR, no pericardial effusion  . History of nuclear stress test    Myoview 5/18: EF 49, no ischemia, inferoseptal defect c/w LBBB artifact (intermediate risk due to EF < 50).  [name] Kitchen HTN (hypertension)   . Hyperlipidemia   . NICM (nonischemic cardiomyopathy) ([place])    a. Nuclear 5/13: Normal stress nuclear study. LV Ejection Fraction: 58%  //  b. LHC 10/14: Minor luminal irregularity in prox LAD, EF 35%   . Parkinson disease ([place])   .  Plantar fasciitis   . Sleep apnea    was retested and no longer had it and so d/c CPAP  . Urticaria     Past Surgical History:  Procedure Laterality Date  . BREAST EXCISIONAL BIOPSY Left 1970   benign cyst  . BREAST SURGERY Left   . BUNIONECTOMY    . CARDIAC CATHETERIZATION N/A 12/16/2015   Procedure: Right Heart Cath;  Surgeon: Sherren Mocha, MD;  Location: [facility];  Service: Cardiovascular;  Laterality: N/A;  . CHOLECYSTECTOMY    . eye lid surgery Bilateral 05/09/2019  . IMPLANTABLE CARDIOVERTER DEFIBRILLATOR IMPLANT  11-25-13   MDT dual chamber ICD implanted by [name] for primary prevention  . IMPLANTABLE CARDIOVERTER DEFIBRILLATOR IMPLANT N/A 11/25/2013   Procedure: IMPLANTABLE CARDIOVERTER DEFIBRILLATOR IMPLANT;  Surgeon: [name], MD;  Location: [facility] CATH LAB;  Service: Cardiovascular;  Laterality: N/A;  . LEFT AND RIGHT HEART CATHETERIZATION WITH CORONARY ANGIOGRAM N/A 05/31/2013   Procedure: LEFT AND RIGHT HEART CATHETERIZATION WITH CORONARY ANGIOGRAM;  Surgeon: [name], MD;  Location: [facility] CATH LAB;  Service: Cardiovascular;  Laterality: N/A;  . TONSILLECTOMY    . TUBAL LIGATION      There were no vitals filed for this visit.  Subjective Assessment - 06/23/20 0937    Subjective Having a little bit of an upset stomach this morning. Had an exacerbation of her asthma - was given prednisone for 5 days. Almost had a fall getting out of the tub, states that her shower chair is too big and will be getting a smaller one to make it easier to get out.    Pertinent History PD, chronic systolic CHF, diabetes, HTN, HLD    Limitations Walking    Patient Stated Goals wants to walk better    Currently in Pain? No/denies    Pain Onset In the past 7 days                             [facility] Adult PT Treatment/Exercise - 06/23/20 0001      Transfers   Transfers Sit to Stand;Stand to Sit    Sit to Stand 5: Supervision    Sit to Stand Details  (indicate cue type and reason) multiple reps throughout session, pt begins to perform with BUE on rollator, but then corrects to proper UE placement    Stand to Sit 5: Supervision;To chair/3-in-1;With armrests;With upper extremity assist    Comments TUG shuttle sit > stand from mat table with rollator and ambulating approx. 15' to chair and sitting and repeating back to mat table, pt with taking longer strides and when turning cues for incr foot clearance and wider BOS x3 reps       Ambulation/Gait   Ambulation/Gait Yes    Ambulation/Gait Assistance 5: Supervision    Ambulation/Gait Assistance Details pt with incr stride length throughout session, noticed decr stance time on RLE at times    Ambulation Distance (Feet) 115 Feet    Assistive device Rollator    Gait Pattern Step-through pattern;Decreased step length - right;Decreased step length - left;Antalgic;Narrow base of support    Ambulation Surface Level;Indoor      Therapeutic Activites    Therapeutic Activities Other Therapeutic Activities    Other Therapeutic Activities discussed with pt only having 1 more visit left potentially performing re-cert and adding more visits, pt reporting at this time it is difficult with her husband's health at this time for her to come to anymore sessions and would like to discharge at this time. Discussed pt returning in 6 months for return PT eval with pt in agreement. Discussed importance of continued exercise after discharge and walking/performing HEP consistently as pt reports that she is still spending the majority of the day in bed. Asked pt for her to bring her husband back to next session to review exercises for incr compliance at home.      Exercises   Exercises Other Exercises    Other Exercises  SciFit level 1.5 for 5 minutes for BLE strengthening, ROM, activity tolerance - cues to work at an intensity level of 6-7/10, HR after 110 bpm and O2 at 96%             Access Code: [email or web address] URL:  [email or web address] Date: 06/23/2020 Prepared by: [name]  New additions to HEP added below, pt needing initial verbal and demo cues.    Exercises Seated March - 1-2 x daily - 5 x weekly - 1 sets - 10 reps Seated Long Arc Quad - 1-2 x daily - 5 x weekly - 1 sets - 10 reps Seated Ankle Pumps - 1-2 x daily - 5 x weekly - 1 sets - 10 reps Seated  Hamstring Stretch - 1 x daily - 5 x weekly - 2-3 sets - 20-25 hold Seated Trunk Rotation - 1 x daily - 5 x weekly - 2 sets - 5 reps Lateral Weight Shift - 1 x daily - 5 x weekly - 2 sets - 10 reps Staggered Stance Forward Backward Weight Shift with Counter Support - 1 x daily - 5 x weekly - 2 sets - 10 reps       PT Education - 06/23/20 1310    Education Details see TA, additions of standing balance exercises to HEP.    Person(s) Educated Patient    Methods Explanation;Demonstration;Handout    Comprehension Verbalized understanding;Returned demonstration;Verbal cues required            PT Short Term Goals - 06/05/20 0901      PT SHORT TERM GOAL #1   Title Pt will be independent with initial HEP in order to build upon functional gains made in therapy. ALL STGS DUE 06/05/20    Baseline pt reports not consistently performing at home.    Time 4    Period Weeks    Status Not Met    Target Date 06/05/20      PT SHORT TERM GOAL #2   Title Pt will improve gait speed to at least 1.8 ft/sec with RW vs. straight cane with 3 prong tip in order to demo decr fall risk.    Baseline 1.54 ft/sec, 23.59 seconds = 1.39 ft/sec with rollator    Time 4    Period Weeks    Status Not Met      PT SHORT TERM GOAL #3   Title Pt will decr TUG score to 16.5 seconds or less with SPC with 3 prong tip in order to demo decr fall risk.    Baseline 19.08 seconds, 28.16 with SPC with 3 prong tip and 32.25 seconds with rollator on 06/05/20    Time 4    Period Weeks    Status Not Met      PT SHORT TERM GOAL #4   Title Pt will perform 5x  sit <> stand in 24 seconds or less with no UE support and no episodes of posterior bracing against chair.    Baseline 34.09 seconds with BUE support from mat table, proper technique, no episodes of BLE bracing    Time 4    Period Weeks    Status Not Met      PT SHORT TERM GOAL #5   Title Pt will verbalize understanding of fall prevention in the home.    Time 4    Period Weeks    Status New             PT Long Term Goals - 06/05/20 1253      PT LONG TERM GOAL #1   Title Pt will be independent with final HEP in order to build upon functional gains made in therapy. ALL LTGS DUE 07/03/20    Time 8    Period Weeks    Status New      PT LONG TERM GOAL #2   Title Pt will improve gait speed to at least 1.8 ft/sec with rollator  vs. straight cane with 3 prong tip in order to demo decr fall risk and improved gait efficiency    Baseline 1.39 ft/sec with rollator    Time 8    Period Weeks    Status Revised      PT LONG TERM GOAL #3  Title Pt will perform 5x sit <> stand in 24 seconds or less with no UE support and no episodes of posterior bracing against chair.    Baseline 27.22 seconds    Time 8    Period Weeks    Status New      PT LONG TERM GOAL #4   Title Pt will improve BERG score to at least a 50/56 in order to demo decr fall risk.    Baseline 44/56    Time 8    Period Weeks    Status New      PT LONG TERM GOAL #5   Title Pt will decr TUG score to 20 seconds or less with SPC with 3 prong tip vs. rollator in order to demo decr fall risk.    Baseline 28.16 with SPC with 3 prong tip and 32.25 seconds with rollator on 06/05/20    Time 8    Period Weeks    Status Revised                 Plan - 06/23/20 2144    Clinical Impression Statement Pt only having one more appt next week, pt reporting she would like to discharge at this time. Added standing balance to pt's HEP today. Will check LTGs at next session and asked pt to have her husband come back for incr  compliance with exercises at home. Performed TUG shuttle today with focus on wider BOS and foot clearance with rollator.    Personal Factors and Comorbidities Comorbidity 3+;Past/Current Experience;Time since onset of injury/illness/exacerbation    Comorbidities PD, chronic systolic CHF, diabetes, HTN, HLD    Examination-Activity Limitations Locomotion Level;Transfers;Stairs    Examination-Participation Restrictions Community Activity;Shop    Stability/Clinical Decision Making Stable/Uncomplicated    Rehab Potential Good    PT Frequency 2x / week    PT Duration 8 weeks    PT Treatment/Interventions ADLs/Self Care Home Management;DME Instruction;Gait training;Stair training;Functional mobility training;Therapeutic activities;Therapeutic exercise;Balance training;Neuromuscular re-education;Patient/family education;Vestibular;Energy conservation    PT Next Visit Plan 10th visit PN. discharge - check LTGs. finalize HEP. schedule return eval in 6 monthd    Consulted and Agree with Plan of Care Patient           Patient will benefit from skilled therapeutic intervention in order to improve the following deficits and impairments:  Abnormal gait, Decreased balance, Decreased activity tolerance, Decreased strength, Difficulty walking, Impaired sensation, Decreased endurance  Visit Diagnosis: Other abnormalities of gait and mobility  Other symptoms and signs involving the nervous system  Muscle weakness (generalized)  Unsteadiness on feet     Problem List Patient Active Problem List   Diagnosis Date Noted  . Chronic rhinitis 12/18/2019  . Chronic respiratory failure with hypoxia ([place]) 12/18/2019  . Back pain 09/18/2019  . Post-nasal drip 06/06/2019  . Hyperlipidemia associated with type 2 diabetes mellitus ([place]) 03/05/2019  . Contact dermatitis 03/05/2019  . Cervical disc disorder with radiculopathy of cervical region 09/24/2018  . Leg cramping 12/03/2017  . Cognitive impairment  10/19/2017  . Syncope   . Osteopenia 07/10/2015  . Mild persistent asthma in adult without complication 25/36/6440  . NICM (nonischemic cardiomyopathy) ([place]) 04/23/2014  . Arthritis or polyarthritis, rheumatoid ([place]) 03/12/2014  . ICD (implantable cardioverter-defibrillator), dual, in situ 03/06/2014  . Thoracic or lumbosacral neuritis or radiculitis, unspecified 07/03/2013  . Chronic systolic heart failure ([place]) 05/31/2013  . Parkinson disease ([place]) 05/21/2013  . Major depression 03/31/2013  . Vertigo 03/03/2013  . GERD (gastroesophageal reflux  disease) 09/27/2012  . Plantar fasciitis, bilateral 06/15/2012  . 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 Diabetic peripheral neuropathy ([place]) 12/09/2011  . Hyperlipidemia 12/09/2011  . Chronic urticaria 05/27/2011  . 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 05/20/2011  . ROSACEA 05/29/2009  . ACHILLES BURSITIS OR TENDINITIS 03/27/2009  . Type II diabetes mellitus with complication ([place]) 74/16/3845  . Essential hypertension 09/28/2006    Arliss Journey , PT, DPT 06/23/2020, 9:47 PM  [facility] [address] [place], [place], [address] Phone: [phone]   Fax:  [phone]  Name: Teka Chanda MRN: [id number] Date of Birth: 10-Dec-1950

## 2020-06-23 NOTE — Patient Instructions (Signed)
Access Code: [email or web address] URL: [email or web address] Date: 06/23/2020 Prepared by: [name]  Exercises Seated March - 1-2 x daily - 5 x weekly - 1 sets - 10 reps Seated Long Arc Quad - 1-2 x daily - 5 x weekly - 1 sets - 10 reps Seated Ankle Pumps - 1-2 x daily - 5 x weekly - 1 sets - 10 reps Seated Hamstring Stretch - 1 x daily - 5 x weekly - 2-3 sets - 20-25 hold Seated Trunk Rotation - 1 x daily - 5 x weekly - 2 sets - 5 reps Lateral Weight Shift - 1 x daily - 5 x weekly - 2 sets - 10 reps Staggered Stance Forward Backward Weight Shift with Counter Support - 1 x daily - 5 x weekly - 2 sets - 10 reps

## 2020-06-24 ENCOUNTER — Other Ambulatory Visit: Payer: Self-pay

## 2020-06-24 MED ORDER — METFORMIN HCL 500 MG PO TABS
1000.0000 mg | ORAL_TABLET | Freq: Two times a day (BID) | ORAL | 3 refills | Status: DC
Start: 2020-06-24 — End: 2021-08-02

## 2020-06-26 ENCOUNTER — Ambulatory Visit: Payer: Medicare Other | Admitting: Physical Therapy

## 2020-06-27 ENCOUNTER — Other Ambulatory Visit: Payer: Self-pay | Admitting: Neurology

## 2020-06-30 ENCOUNTER — Encounter: Payer: Self-pay | Admitting: Physical Therapy

## 2020-06-30 ENCOUNTER — Ambulatory Visit: Payer: Medicare Other | Admitting: Physical Therapy

## 2020-06-30 ENCOUNTER — Other Ambulatory Visit: Payer: Self-pay

## 2020-06-30 DIAGNOSIS — R2681 Unsteadiness on feet: Secondary | ICD-10-CM

## 2020-06-30 DIAGNOSIS — R2689 Other abnormalities of gait and mobility: Secondary | ICD-10-CM | POA: Diagnosis not present

## 2020-06-30 DIAGNOSIS — M6281 Muscle weakness (generalized): Secondary | ICD-10-CM

## 2020-06-30 DIAGNOSIS — R29818 Other symptoms and signs involving the nervous system: Secondary | ICD-10-CM | POA: Diagnosis not present

## 2020-06-30 NOTE — Therapy (Signed)
[facility] [address] [place], [place], [address] Phone: [phone]   Fax:  [phone]  Physical Therapy Treatment/10th Visit Progress Note/Discharge Summary  Patient Details  Name: Veronica Shaw MRN: [id number] Date of Birth: [birth year] Referring Provider (PT): Alonza Bogus, DO  10th Visit Physical Therapy Progress Note  Dates of Reporting Period: 05/06/20 to 06/30/20   Encounter Date: 06/30/2020   PT End of Session - 06/30/20 1026    Visit Number 10    Number of Visits 17    Date for PT Re-Evaluation 08/04/20   60 day POC, 90 day cert   Authorization Type Medicare    Progress Note Due on Visit 10    PT Start Time 0931    PT Stop Time 1014    PT Time Calculation (min) 43 min    Equipment Utilized During Treatment Gait belt    Activity Tolerance Patient tolerated treatment well    Behavior During Therapy [facility] for tasks assessed/performed           Past Medical History:  Diagnosis Date  . Asthma   . Chronic systolic CHF (congestive heart failure) ([place])    a. cMRI 4/15: EF 34% and findings - c/w NICM, normal RV size and function (RVEF 61%), Mild MR // b. Echo 2/15:  EF 30-35%, diff HK, ant-sept AK, Gr 2 DD, mild MR, trivial TR  //  c. Echo 5/17: EF 20-25%, severe diffuse HK, marked systolic dyssynchrony, grade 1 diastolic dysfunction, mild MR  //  d. RHC 5/17: Fick CO 2.9, RVSP 19, PASP 15, PW mean 2, low filing pressures and preserved CO   . Diabetes mellitus   . Flu 10/17/2017  . Gastritis   . History of echocardiogram    Echo 6/18: EF 30-35, diffuse HK, grade 1 diastolic dysfunction, trivial MR, mild LAE, mild TR, no pericardial effusion  . History of nuclear stress test    Myoview 5/18: EF 49, no ischemia, inferoseptal defect c/w LBBB artifact (intermediate risk due to EF < 50).  [name] Kitchen HTN (hypertension)   . Hyperlipidemia   . NICM (nonischemic cardiomyopathy) ([place])    a. Nuclear 5/13: Normal stress  nuclear study. LV Ejection Fraction: 58%  //  b. LHC 10/14: Minor luminal irregularity in prox LAD, EF 35%   . Parkinson disease ([place])   . Plantar fasciitis   . Sleep apnea    was retested and no longer had it and so d/c CPAP  . Urticaria     Past Surgical History:  Procedure Laterality Date  . BREAST EXCISIONAL BIOPSY Left 1970   benign cyst  . BREAST SURGERY Left   . BUNIONECTOMY    . CARDIAC CATHETERIZATION N/A 12/16/2015   Procedure: Right Heart Cath;  Surgeon: Sherren Mocha, MD;  Location: [facility];  Service: Cardiovascular;  Laterality: N/A;  . CHOLECYSTECTOMY    . eye lid surgery Bilateral 05/09/2019  . IMPLANTABLE CARDIOVERTER DEFIBRILLATOR IMPLANT  11-25-13   MDT dual chamber ICD implanted by [name] for primary prevention  . IMPLANTABLE CARDIOVERTER DEFIBRILLATOR IMPLANT N/A 11/25/2013   Procedure: IMPLANTABLE CARDIOVERTER DEFIBRILLATOR IMPLANT;  Surgeon: [name], MD;  Location: [facility] C[facility];  Service: Cardiovascular;  Laterality: N/A;  . LEFT AND RIGHT HEART CATHETERIZATION WITH CORONARY ANGIOGRAM N/A 05/31/2013   Procedure: LEFT AND RIGHT HEART CATHETERIZATION WITH CORONARY ANGIOGRAM;  Surgeon: [name], MD;  Location: [facility] CATH LAB;  Service: Cardiovascular;  Laterality: N/A;  . TONSILLECTOMY    .  TUBAL LIGATION      There were no vitals filed for this visit.   Subjective Assessment - 06/30/20 0934    Subjective Got booster shot yesterday - just feeling a little bit sore today. Didn't sleep well last night due to arthritic pain. Has had 2 falls - one time her husband left clothes on the floor and she tripped on them without having her walker, also had another fall going to the bathroom and had a trip over the lip of a rug. Had to get her husband to help her up, her back was sore.    Pertinent History PD, chronic systolic CHF, diabetes, HTN, HLD    Limitations Walking    Patient Stated Goals wants to walk better    Currently in Pain? Yes    Pain  Score 6     Pain Location --   "mostly in back and arms"   Pain Orientation Left;Right    Pain Descriptors / Indicators Sore    Pain Type Chronic pain    Pain Onset In the past 7 days    Aggravating Factors  getting out of bed.    Pain Relieving Factors a warm shower.                    Reviewed finalized HEP with pt:    Pt performs PWR! Moves inseatedposition   PWR! Up for improved posture: beginning first with leaning forward and coming up tall with B hands on knees - cues for scap retraction once seated x10 reps.   PWR! Rock for improved weighshiftingx 5 reps each side, with visual, verbal cues - reaching up and across body  PWR! Twist for improved trunk rotation- modified x5 reps each side twisting and looking over shoulder  PWR! Step for improved step initiation - x5 reps each side, cues for out and in.   Pt reports feeling better after performing.    Access Code: [email or web address] URL: [email or web address] Date: 06/23/2020 Prepared by: [name]  See [place] for more details.   Exercises Seated March - 1-2 x daily - 5 x weekly - 1 sets - 10 reps Seated Long Arc Quad - 1-2 x daily - 5 x weekly - 1 sets - 10 reps Seated Ankle Pumps - 1-2 x daily - 5 x weekly - 1 sets - 10 reps Seated Hamstring Stretch - 1 x daily - 5 x weekly - 2-3 sets - 20-25 hold Seated Trunk Rotation - 1 x daily - 5 x weekly - 2 sets - 5 reps Lateral Weight Shift - 1 x daily - 5 x weekly - 2 sets - 10 reps Staggered Stance Forward Backward Weight Shift with Counter Support - 1 x daily - 5 x weekly - 2 sets - 10 reps      [facility] Adult PT Treatment/Exercise - 06/30/20 0001      Transfers   Transfers Sit to Stand;Stand to Sit    Sit to Stand 5: Supervision    Five time sit to stand comments  22.13 seconds with BUE support - no posterior bracing.    Stand to Sit 5: Supervision;To chair/3-in-1;With armrests;With upper extremity assist      Ambulation/Gait    Ambulation/Gait Yes    Ambulation/Gait Assistance 5: Supervision    Ambulation/Gait Assistance Details initial cues for posture    Ambulation Distance (Feet) --   clinic distances with rollator   Assistive device Rollator    Gait Pattern Step-through pattern;Decreased step length -  right;Decreased step length - left;Antalgic;Narrow base of support    Ambulation Surface Level;Indoor    Gait velocity 26.2 seconds with rollator = 1.25 ft/sec       Therapeutic Activites    Therapeutic Activities Other Therapeutic Activities    Other Therapeutic Activities provided pt with handout for fall prevention in the home to discuss with pt's husband and pt's daughter to help decr pt's risk of falls in the home. Pt wishing to be discharged at this time due to husband's health and he will be undergoing an operation in the next month, discussed returning in 6 months for a return eval with pt in agreement. Discussed using rollator at all times in the house for incr safety with gait.                  PT Education - 06/30/20 1025    Education Details see TA. reviewed finalized HEP. provided pt with handout on fall prevention in the home with pt's husband and pt's daughter.    Person(s) Educated Patient    Methods Explanation;Demonstration;Handout    Comprehension Verbalized understanding;Returned demonstration            PT Short Term Goals - 06/05/20 0901      PT SHORT TERM GOAL #1   Title Pt will be independent with initial HEP in order to build upon functional gains made in therapy. ALL STGS DUE 06/05/20    Baseline pt reports not consistently performing at home.    Time 4    Period Weeks    Status Not Met    Target Date 06/05/20      PT SHORT TERM GOAL #2   Title Pt will improve gait speed to at least 1.8 ft/sec with RW vs. straight cane with 3 prong tip in order to demo decr fall risk.    Baseline 1.54 ft/sec, 23.59 seconds = 1.39 ft/sec with rollator    Time 4    Period Weeks     Status Not Met      PT SHORT TERM GOAL #3   Title Pt will decr TUG score to 16.5 seconds or less with SPC with 3 prong tip in order to demo decr fall risk.    Baseline 19.08 seconds, 28.16 with SPC with 3 prong tip and 32.25 seconds with rollator on 06/05/20    Time 4    Period Weeks    Status Not Met      PT SHORT TERM GOAL #4   Title Pt will perform 5x sit <> stand in 24 seconds or less with no UE support and no episodes of posterior bracing against chair.    Baseline 34.09 seconds with BUE support from mat table, proper technique, no episodes of BLE bracing    Time 4    Period Weeks    Status Not Met      PT SHORT TERM GOAL #5   Title Pt will verbalize understanding of fall prevention in the home.    Time 4    Period Weeks    Status New             PT Long Term Goals - 06/30/20 1610      PT LONG TERM GOAL #1   Title Pt will be independent with final HEP in order to build upon functional gains made in therapy. ALL LTGS DUE 07/03/20    Baseline reviewed on 06/30/20    Time 8    Period Weeks  Status Partially Met      PT LONG TERM GOAL #2   Title Pt will improve gait speed to at least 1.8 ft/sec with rollator  vs. straight cane with 3 prong tip in order to demo decr fall risk and improved gait efficiency    Baseline 1.39 ft/sec with rollator, 26.2 seconds with rollator = 1.25 ft/sec    Time 8    Period Weeks    Status Not Met      PT LONG TERM GOAL #3   Title Pt will perform 5x sit <> stand in 24 seconds or less with no UE support and no episodes of posterior bracing against chair.    Baseline 27.22 seconds, 22.13 seconds with BUE support - no posterior bracing.    Time 8    Period Weeks    Status Partially Met      PT LONG TERM GOAL #4   Title Pt will improve BERG score to at least a 50/56 in order to demo decr fall risk.    Baseline 44/56, unable to check due to time constraints.    Time 8    Period Weeks    Status Unable to assess      PT LONG TERM GOAL #5     Title Pt will decr TUG score to 20 seconds or less with SPC with 3 prong tip vs. rollator in order to demo decr fall risk.    Baseline 28.16 with SPC with 3 prong tip and 32.25 seconds with rollator on 06/05/20, unable to check due to time constraints.    Time 8    Period Weeks    Status Unable to assess           PHYSICAL THERAPY DISCHARGE SUMMARY  Visits from Start of Care: 10  Current functional level related to goals / functional outcomes: See LTGs.   Remaining deficits: Gait abnormalities, postural impairments, balance impairments, decr endurance, decr BLE strength.    Education / Equipment: HEP  Plan: Patient agrees to discharge.  Patient goals were not met. Patient is being discharged due to the patient's request.  ?????            Plan - 06/30/20 1041    Clinical Impression Statement Focus of today's skilled session was assessing pt's LTGs for D/C. Pt wishes to be discharged today due to her husband having some other health issues going on at this time. Husband not present today to review HEP (as discussed at previous session). Reviewed pt's HEP with pt with pt reporting that she felt better after performing and less stiff, continued to educate on importance of movement at home. Pt did not meet LTG in regards to gait speed - pt's speed at 1.25 ft/sec with rollator indicating that pt is at an increased risk for falls. Pt partially met LTG #5 in regards to 5x sit <> stand - able to perform in 22.13 seconds with BUE support and no episodes of posterior bracing. Due to pt wanting to discharge at this time, discussed scheduling a return PT eval in 6 months with pt in agreement. Unable to assess remainder of LTGs due to time constraints.    Personal Factors and Comorbidities Comorbidity 3+;Past/Current Experience;Time since onset of injury/illness/exacerbation    Comorbidities PD, chronic systolic CHF, diabetes, HTN, HLD    Examination-Activity Limitations Locomotion  Level;Transfers;Stairs    Examination-Participation Restrictions Community Activity;Shop    Stability/Clinical Decision Making Stable/Uncomplicated    Rehab Potential Good    PT  Frequency 2x / week    PT Duration 8 weeks    PT Treatment/Interventions ADLs/Self Care Home Management;DME Instruction;Gait training;Stair training;Functional mobility training;Therapeutic activities;Therapeutic exercise;Balance training;Neuromuscular re-education;Patient/family education;Vestibular;Energy conservation    PT Next Visit Plan D/C.    Consulted and Agree with Plan of Care Patient           Patient will benefit from skilled therapeutic intervention in order to improve the following deficits and impairments:  Abnormal gait, Decreased balance, Decreased activity tolerance, Decreased strength, Difficulty walking, Impaired sensation, Decreased endurance  Visit Diagnosis: Other abnormalities of gait and mobility  Other symptoms and signs involving the nervous system  Muscle weakness (generalized)  Unsteadiness on feet     Problem List Patient Active Problem List   Diagnosis Date Noted  . Chronic rhinitis 12/18/2019  . Chronic respiratory failure with hypoxia ([place]) 12/18/2019  . Back pain 09/18/2019  . Post-nasal drip 06/06/2019  . Hyperlipidemia associated with type 2 diabetes mellitus ([place]) 03/05/2019  . Contact dermatitis 03/05/2019  . Cervical disc disorder with radiculopathy of cervical region 09/24/2018  . Leg cramping 12/03/2017  . Cognitive impairment 10/19/2017  . Syncope   . Osteopenia 07/10/2015  . Mild persistent asthma in adult without complication 37/10/8887  . NICM (nonischemic cardiomyopathy) ([place]) 04/23/2014  . Arthritis or polyarthritis, rheumatoid ([place]) 03/12/2014  . ICD (implantable cardioverter-defibrillator), dual, in situ 03/06/2014  . Thoracic or lumbosacral neuritis or radiculitis, unspecified 07/03/2013  . Chronic systolic heart failure ([place]) 05/31/2013  .  Parkinson disease ([place]) 05/21/2013  . Major depression 03/31/2013  . Vertigo 03/03/2013  . GERD (gastroesophageal reflux disease) 09/27/2012  . Plantar fasciitis, bilateral 06/15/2012  . 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 Diabetic peripheral neuropathy ([place]) 12/09/2011  . Hyperlipidemia 12/09/2011  . Chronic urticaria 05/27/2011  . 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 05/20/2011  . ROSACEA 05/29/2009  . ACHILLES BURSITIS OR TENDINITIS 03/27/2009  . Type II diabetes mellitus with complication ([place]) 16/94/5038  . Essential hypertension 09/28/2006    Arliss Journey, PT, DPT  06/30/2020, 10:42 AM  [facility] [address] [place], [place], [address] Phone: [phone]   Fax:  [phone]  Name: Veronica Shaw MRN: [id number] Date of Birth: 04/26/1951

## 2020-06-30 NOTE — Patient Instructions (Signed)

## 2020-07-01 ENCOUNTER — Other Ambulatory Visit: Payer: Self-pay

## 2020-07-01 ENCOUNTER — Ambulatory Visit (INDEPENDENT_AMBULATORY_CARE_PROVIDER_SITE_OTHER): Payer: Medicare Other

## 2020-07-01 VITALS — Ht 61.5 in | Wt 135.0 lb

## 2020-07-01 DIAGNOSIS — Z0000 Encounter for general adult medical examination without abnormal findings: Secondary | ICD-10-CM

## 2020-07-01 NOTE — Patient Instructions (Addendum)
You spoke to Veronica Shaw, [place] over the phone for your annual wellness visit.  We discussed goals: Goals    .  [facility] and Keep All Appointments      Follow Up Date [id number]   - call to cancel if needed - keep a calendar with prescription refill dates - keep a calendar with appointment dates    Why is this important?   Part of staying healthy is seeing the doctor for follow-up care.  If you forget your appointments, there are some things you can do to stay on track.    Notes:     .  (THN)Track and Manage Activity and Exertion      Follow Up Date 07/31/2020   - meet with physical therapist - pace activity allowing for rest    Why is this important?   Exercising is very important when managing your heart failure.  It will help your heart get stronger.    Notes:     .  (THN)Track and Manage Fluids and Swelling      Follow Up Date [id number]   - call office if I gain more than 2 pounds in one day or 5 pounds in one week - keep legs up while sitting - track weight in diary - use salt in moderation - watch for swelling in feet, ankles and legs every day - weigh myself daily    Why is this important?   It is important to check your weight daily and watch how much salt and liquids you have.  It will help you to manage your heart failure.    Notes:     .  (THN)Track and Manage Symptoms      Follow Up Date [id number]   - develop a rescue plan - follow rescue plan if symptoms flare-up - know when to call the doctor - track symptoms and what helps feel better or worse    Why is this important?   You will be able to handle your symptoms better if you keep track of them.  Making some simple changes to your lifestyle will help.  Eating healthy is one thing you can do to take good care of yourself.    Notes:     .  Blood Pressure < 140/90    .  Client verbalize knowledge of Heart Failure disease self management skills by monitoring weight, medication adherence and low  sodium diet      CARE PLAN ENTRY (see longtitudinal plan of care for additional care plan information)   Current Barriers:  [name] Kitchen Knowledge deficit related to basic heart failure pathophysiology and self care management . Knowledge Deficits related to heart failure medications . Cognitive Deficits  Case Manager Clinical Goal(s):  [name] Kitchen Over the next 90 days, patient will verbalize understanding of Heart Failure Action Plan and when to call doctor . Over the next 90 days, patient will take all Heart Failure mediations as prescribed . Over the next 90 days, patient will weigh daily and record (notifying MD of 3 lb weight gain over night or 5 lb in a week) . Patient will verbalize two symptoms of CHF exacerbation within the next 90 days.   . Patient will verbalize receiving annual flu vaccine within the next 90 days.   Interventions:  . Basic overview and discussion of pathophysiology of Heart Failure reviewed  . Provided verbal education on low sodium diet . Provided written education on low sodium diet . Advised patient to weigh each  morning after emptying bladder . Discussed importance of daily weight and advised patient to weigh and record daily . Reviewed role of diuretics in prevention of fluid overload and management of heart failure . Discussed all medications that patient is taking and their purpose  Patient Self Care Activities:  . Takes Heart Failure Medications as prescribed . Weighs daily and record (notifying MD of 3 lb weight gain over night or 5 lb in a week) . Verbalizes understanding of and follows CHF Action Plan . Adheres to low sodium diet  Initial goal documentation     .  HEMOGLOBIN A1C < 7.0 (pt-stated)    .  LDL CALC < 100    .  Weight (lb) < 133 lb (60.3 kg)      5% weight loss      We also discussed recommended health maintenance. As discussed, you are up to date with pretty much everything. We have scheduled you with PCP on 12/15 $Remove'@10am'rJiPblo$ .  Health Maintenance    Topic Date Due  . OPHTHALMOLOGY EXAM  01/25/2018  . FOOT EXAM  09/25/2019  . HEMOGLOBIN A1C  03/17/2020  . MAMMOGRAM  12/10/2021  . COLONOSCOPY  01/12/2023  . TETANUS/TDAP  03/27/2026  . INFLUENZA VACCINE  Completed  . DEXA SCAN  Completed  . COVID-19 Vaccine  Completed  . Hepatitis C Screening  Completed  . PNA vac Low Risk Adult  Completed   I have mailed you an Advance Directive.  PCP apt made for 07/15/2020 @ 10:00am. Continue your stretching exercises at home! Happy Holidays!  Preventive Care 80 Years and Older, Female Preventive care refers to lifestyle choices and visits with your health care provider that can promote health and wellness. This includes:  A yearly physical exam. This is also called an annual well check.  Regular dental and eye exams.  Immunizations.  Screening for certain conditions.  Healthy lifestyle choices, such as diet and exercise. What can I expect for my preventive care visit? Physical exam Your health care provider will check:  Height and weight. These may be used to calculate body mass index (BMI), which is a measurement that tells if you are at a healthy weight.  Heart rate and blood pressure.  Your skin for abnormal spots. Counseling Your health care provider may ask you questions about:  Alcohol, tobacco, and drug use.  Emotional well-being.  Home and relationship well-being.  Sexual activity.  Eating habits.  History of falls.  Memory and ability to understand (cognition).  Work and work Statistician.  Pregnancy and menstrual history. What immunizations do I need?  Influenza (flu) vaccine  This is recommended every year. Tetanus, diphtheria, and pertussis (Tdap) vaccine  You may need a Td booster every 10 years. Varicella (chickenpox) vaccine  You may need this vaccine if you have not already been vaccinated. Zoster (shingles) vaccine  You may need this after age 68. Pneumococcal conjugate (PCV13)  vaccine  One dose is recommended after age 13. Pneumococcal polysaccharide (PPSV23) vaccine  One dose is recommended after age 51. Measles, mumps, and rubella (MMR) vaccine  You may need at least one dose of MMR if you were born in 1957 or later. You may also need a second dose. Meningococcal conjugate (MenACWY) vaccine  You may need this if you have certain conditions. Hepatitis A vaccine  You may need this if you have certain conditions or if you travel or work in places where you may be exposed to hepatitis A. Hepatitis B vaccine  You may need this if you have certain conditions or if you travel or work in places where you may be exposed to hepatitis B. Haemophilus influenzae type b (Hib) vaccine  You may need this if you have certain conditions. You may receive vaccines as individual doses or as more than one vaccine together in one shot (combination vaccines). Talk with your health care provider about the risks and benefits of combination vaccines. What tests do I need? Blood tests  Lipid and cholesterol levels. These may be checked every 5 years, or more frequently depending on your overall health.  Hepatitis C test.  Hepatitis B test. Screening  Lung cancer screening. You may have this screening every year starting at age 57 if you have a 30-pack-year history of smoking and currently smoke or have quit within the past 15 years.  Colorectal cancer screening. All adults should have this screening starting at age 70 and continuing until age 19. Your health care provider may recommend screening at age 39 if you are at increased risk. You will have tests every 1-10 years, depending on your results and the type of screening test.  Diabetes screening. This is done by checking your blood sugar (glucose) after you have not eaten for a while (fasting). You may have this done every 1-3 years.  Mammogram. This may be done every 1-2 years. Talk with your health care provider about how  often you should have regular mammograms.  BRCA-related cancer screening. This may be done if you have a family history of breast, ovarian, tubal, or peritoneal cancers. Other tests  Sexually transmitted disease (STD) testing.  Bone density scan. This is done to screen for osteoporosis. You may have this done starting at age 20. Follow these instructions at home: Eating and drinking  Eat a diet that includes fresh fruits and vegetables, whole grains, lean protein, and low-fat dairy products. Limit your intake of foods with high amounts of sugar, saturated fats, and salt.  Take vitamin and mineral supplements as recommended by your health care provider.  Do not drink alcohol if your health care provider tells you not to drink.  If you drink alcohol: ? Limit how much you have to 0-1 drink a day. ? Be aware of how much alcohol is in your drink. In the [place] one drink equals one 12 oz bottle of beer (355 mL), one 5 oz glass of wine (148 mL), or one 1 oz glass of hard liquor (44 mL). Lifestyle  Take daily care of your teeth and gums.  Stay active. Exercise for at least 30 minutes on 5 or more days each week.  Do not use any products that contain nicotine or tobacco, such as cigarettes, e-cigarettes, and chewing tobacco. If you need help quitting, ask your health care provider.  If you are sexually active, practice safe sex. Use a condom or other form of protection in order to prevent STIs (sexually transmitted infections).  Talk with your health care provider about taking a low-dose aspirin or statin. What's next?  Go to your health care provider once a year for a well check visit.  Ask your health care provider how often you should have your eyes and teeth checked.  Stay up to date on all vaccines. This information is not intended to replace advice given to you by your health care provider. Make sure you discuss any questions you have with your health care provider. Document  Revised: 07/12/2018 Document Reviewed: 07/12/2018 Elsevier Patient Education  [phone]  [facility]'s number is [phone]. Please call with questions or concerns about what we discussed today.

## 2020-07-01 NOTE — Progress Notes (Addendum)
Subjective:   Veronica Shaw is a 69 y.o. female who presents for Medicare Annual (Subsequent) preventive examination.  Patient consented to have virtual visit and was identified by name and date of birth. Method of visit: Telephone  Encounter participants: Patient: Veronica Shaw - located at Home Nurse/Provider: Dorna Bloom - located at [facility] Others (if applicable): N/A  Time spent with patient: 35 minutes  Review of Systems: Defer to PCP.  Cardiac Risk Factors include: advanced age (>44men, >12 women);diabetes mellitus;hypertension  Objective:   Vitals: Ht 5' 1.5" (1.562 m)   Wt 135 lb (61.2 kg) Comment: patient reported  LMP 08/01/2000 (Approximate)   BMI 25.09 kg/m   Body mass index is 25.09 kg/m.  Advanced Directives 07/01/2020 05/06/2020 04/08/2020 03/31/2020 10/03/2019 04/02/2019 03/08/2019  Does Patient Have a Medical Advance Directive? No No No No No No No  Type of Advance Directive - - - - - - -  Does patient want to make changes to medical advance directive? - - - No - Patient declined - - -  Would patient like information on creating a medical advance directive? Yes (MAU/Ambulatory/Procedural Areas - Information given) - - No - Patient declined No - Patient declined No - Patient declined No - Patient declined   Tobacco Social History   Tobacco Use  Smoking Status Never Smoker  Smokeless Tobacco Never Used  Tobacco Comment   exposed to smoke during child hood (parents)     Clinical Intake:  Pre-visit preparation completed: Yes  Pain Score: 5   How often do you need to have someone help you when you read instructions, pamphlets, or other written materials from your doctor or pharmacy?: 2 - Rarely What is the last grade level you completed in school?: High School  Interpreter Needed?: No  Past Medical History:  Diagnosis Date  . Asthma   . Chronic systolic CHF (congestive heart failure) ([place])    a. cMRI 4/15: EF 34% and findings - c/w NICM, normal RV  size and function (RVEF 61%), Mild MR // b. Echo 2/15:  EF 30-35%, diff HK, ant-sept AK, Gr 2 DD, mild MR, trivial TR  //  c. Echo 5/17: EF 20-25%, severe diffuse HK, marked systolic dyssynchrony, grade 1 diastolic dysfunction, mild MR  //  d. RHC 5/17: Fick CO 2.9, RVSP 19, PASP 15, PW mean 2, low filing pressures and preserved CO   . Diabetes mellitus   . Flu 10/17/2017  . Gastritis   . History of echocardiogram    Echo 6/18: EF 30-35, diffuse HK, grade 1 diastolic dysfunction, trivial MR, mild LAE, mild TR, no pericardial effusion  . History of nuclear stress test    Myoview 5/18: EF 49, no ischemia, inferoseptal defect c/w LBBB artifact (intermediate risk due to EF < 50).  [name] Kitchen HTN (hypertension)   . Hyperlipidemia   . NICM (nonischemic cardiomyopathy) ([place])    a. Nuclear 5/13: Normal stress nuclear study. LV Ejection Fraction: 58%  //  b. LHC 10/14: Minor luminal irregularity in prox LAD, EF 35%   . Parkinson disease ([place])   . Plantar fasciitis   . Sleep apnea    was retested and no longer had it and so d/c CPAP  . Urticaria    Past Surgical History:  Procedure Laterality Date  . BREAST EXCISIONAL BIOPSY Left 1970   benign cyst  . BREAST SURGERY Left   . BUNIONECTOMY    . CARDIAC CATHETERIZATION N/A 12/16/2015   Procedure: Right Heart  Cath;  Surgeon: Sherren Mocha, MD;  Location: [facility];  Service: Cardiovascular;  Laterality: N/A;  . CHOLECYSTECTOMY    . eye lid surgery Bilateral 05/09/2019  . IMPLANTABLE CARDIOVERTER DEFIBRILLATOR IMPLANT  11-25-13   MDT dual chamber ICD implanted by [name] for primary prevention  . IMPLANTABLE CARDIOVERTER DEFIBRILLATOR IMPLANT N/A 11/25/2013   Procedure: IMPLANTABLE CARDIOVERTER DEFIBRILLATOR IMPLANT;  Surgeon: [name], MD;  Location: [facility] CATH [facility];  Service: Cardiovascular;  Laterality: N/A;  . LEFT AND RIGHT HEART CATHETERIZATION WITH CORONARY ANGIOGRAM N/A 05/31/2013   Procedure: LEFT AND RIGHT HEART CATHETERIZATION WITH  CORONARY ANGIOGRAM;  Surgeon: [name], MD;  Location: [facility] CATH LAB;  Service: Cardiovascular;  Laterality: N/A;  . TONSILLECTOMY    . TUBAL LIGATION     Family History  Problem Relation Age of Onset  . Coronary artery disease Father        Died age 1  . Heart attack Father   . Diabetes Father   . Coronary artery disease Mother        Died age 60  . Heart attack Mother   . Diabetes Mother   . Parkinson's disease Sister   . Heart disease Sister   . Hepatitis C Sister   . Diabetes Sister   . Diabetes Son 37       T1DM  . Healthy Daughter   . Diabetes Sister   . Heart disease Sister   . Diabetes Sister   . Heart disease Sister   . Breast cancer Neg Hx    Social History   Socioeconomic History  . Marital status: Married    Spouse name: [name]   . Number of children: 2  . Years of education: 72  . Highest education level: 12th grade  Occupational History  . Occupation: retired    Comment: CNA  Tobacco Use  . Smoking status: Never Smoker  . Smokeless tobacco: Never Used  . Tobacco comment: exposed to smoke during child hood (parents)  Vaping Use  . Vaping Use: Never used  Substance and Sexual Activity  . Alcohol use: No    Alcohol/week: 0.0 standard drinks  . Drug use: No  . Sexual activity: Yes    Birth control/protection: Post-menopausal  Other Topics Concern  . Not on file  Social History Narrative   Lives at home with husband and the dog.   2 children   Right handed    Drinks coffee, tea, and soda    Social Determinants of Health   Financial Resource Strain: Low Risk   . Difficulty of Paying Living Expenses: Not hard at all  Food Insecurity: No Food Insecurity  . Worried About Charity fundraiser in the Last Year: Never true  . Ran Out of Food in the Last Year: Never true  Transportation Needs: No Transportation Needs  . Lack of Transportation (Medical): No  . Lack of Transportation (Non-Medical): No  Physical Activity: Sufficiently Active  .  Days of Exercise per Week: 7 days  . Minutes of Exercise per Session: 30 min  Stress: No Stress Concern Present  . Feeling of Stress : Not at all  Social Connections: Moderately Integrated  . Frequency of Communication with Friends and Family: More than three times a week  . Frequency of Social Gatherings with Friends and Family: More than three times a week  . Attends Religious Services: More than 4 times per year  . Active Member of Clubs or Organizations: No  .  Attends Archivist Meetings: Never  . Marital Status: Married   Outpatient Encounter Medications as of 07/01/2020  Medication Sig  . albuterol (PROVENTIL HFA;VENTOLIN HFA) 108 (90 Base) MCG/ACT inhaler Inhale 2 puffs into the lungs every 4 (four) hours as needed for wheezing or shortness of breath.  . Biotin 5000 MCG CAPS Take 1 tablet by mouth daily.   . carbidopa-levodopa (SINEMET IR) 25-100 MG tablet TAKE 1 TABLET BY MOUTH THREE TIMES DAILY  . famotidine (PEPCID) 20 MG tablet TAKE 1 TABLET TWICE DAILY  . fluticasone (FLONASE SENSIMIST) 27.5 MCG/SPRAY nasal spray Place 2 sprays into the nose daily.  . furosemide (LASIX) 40 MG tablet TAKE 1/2 TABLET EVERY DAY AS NEEDED FOR  FLUID/SWELLING  . levalbuterol (XOPENEX) 0.63 MG/3ML nebulizer solution 1 vial every 4-6 hours as needed for wheezing and shortness of breath.  . losartan (COZAAR) 25 MG tablet TAKE 1 TABLET EVERY DAY  . meclizine (ANTIVERT) 12.5 MG tablet Take 0.5 tablets (6.25 mg total) by mouth 3 (three) times daily as needed for dizziness.  . Melatonin 10 MG TABS Take by mouth as needed.  . metFORMIN (GLUCOPHAGE) 500 MG tablet Take 2 tablets (1,000 mg total) by mouth 2 (two) times daily with a meal.  . methocarbamol (ROBAXIN) 750 MG tablet Take 750 mg by mouth as needed.   . metoprolol succinate (TOPROL-XL) 50 MG 24 hr tablet TAKE 1 TABLET TWICE DAILY  . potassium chloride (KLOR-CON) 10 MEQ tablet Take 1 tablet (10 mEq total) by mouth as needed (take with lasix  as needed for swelling).  [name] Kitchen spironolactone (ALDACTONE) 25 MG tablet TAKE 1/2 TABLET EVERY DAY  . Turmeric 500 MG TABS Take 1 tablet by mouth daily.  . budesonide-formoterol (SYMBICORT) 160-4.5 MCG/ACT inhaler Inhale 2 puffs into the lungs 2 (two) times daily. (Patient not taking: Reported on 07/01/2020)  . carbidopa-levodopa (SINEMET CR) 50-200 MG tablet Take 1 tablet by mouth at bedtime. (Patient not taking: Reported on 07/01/2020)  . gabapentin (NEURONTIN) 100 MG capsule Take 300mg  (3 pills) at night before bed and 100mg  (1 pill) in the morning with breakfast. (Patient not taking: Reported on 06/15/2020)  . predniSONE (DELTASONE) 20 MG tablet Take 1 tablet (20 mg total) by mouth daily with breakfast.  . [DISCONTINUED] calcium-vitamin D (OSCAL WITH D) 500-200 MG-UNIT tablet Take 1 tablet by mouth.   No facility-administered encounter medications on file as of 07/01/2020.   Activities of Daily Living In your present state of health, do you have any difficulty performing the following activities: 07/01/2020 07/09/2019  Hearing? N N  Vision? N Y  Difficulty concentrating or making decisions? [name]  Walking or climbing stairs? Y Y  Comment Rehab has helped -  Dressing or bathing? N N  Doing errands, shopping? N Y  Conservation officer, nature and eating ? N N  Using the Toilet? N N  In the past six months, have you accidently leaked urine? Y Y  Do you have problems with loss of bowel control? Y N  Managing your Medications? N N  Managing your Finances? N N  Housekeeping or managing your Housekeeping? N N  Some recent data might be hidden   Patient Care Team: [name], DO as PCP - General (Family Medicine) Sherren Mocha, MD as PCP - Cardiology (Cardiology) [name], MD as PCP - Electrophysiology (Cardiology) Katy Apo, MD as Consulting Physician (Ophthalmology) Tat, Eustace Quail, DO as Consulting Physician (Neurology) Tanda Rockers, MD as Consulting Physician (Pulmonary  Disease)  Pleasant, Eppie Gibson, RN as [place] Management    Assessment:   This is a routine wellness examination for [place].  Exercise Activities and Dietary recommendations Current Exercise Habits: Home exercise routine, Type of exercise: stretching, Time (Minutes): 30, Frequency (Times/Week): 7, Weekly Exercise (Minutes/Week): 210, Intensity: Mild, Exercise limited by: neurologic condition(s)  Goals    .  (THN)Make and Keep All Appointments      Follow Up Date [id number]   - call to cancel if needed - keep a calendar with prescription refill dates - keep a calendar with appointment dates    Why is this important?   Part of staying healthy is seeing the doctor for follow-up care.  If you forget your appointments, there are some things you can do to stay on track.    Notes:     .  (THN)Track and Manage Activity and Exertion      Follow Up Date 07/31/2020   - meet with physical therapist - pace activity allowing for rest    Why is this important?   Exercising is very important when managing your heart failure.  It will help your heart get stronger.    Notes:     .  (THN)Track and Manage Fluids and Swelling      Follow Up Date [id number]   - call office if I gain more than 2 pounds in one day or 5 pounds in one week - keep legs up while sitting - track weight in diary - use salt in moderation - watch for swelling in feet, ankles and legs every day - weigh myself daily    Why is this important?   It is important to check your weight daily and watch how much salt and liquids you have.  It will help you to manage your heart failure.    Notes:     .  (THN)Track and Manage Symptoms      Follow Up Date [id number]   - develop a rescue plan - follow rescue plan if symptoms flare-up - know when to call the doctor - track symptoms and what helps feel better or worse    Why is this important?   You will be able to handle your symptoms better if you keep  track of them.  Making some simple changes to your lifestyle will help.  Eating healthy is one thing you can do to take good care of yourself.    Notes:     .  Blood Pressure < 140/90    .  Client verbalize knowledge of Heart Failure disease self management skills by monitoring weight, medication adherence and low sodium diet      CARE PLAN ENTRY (see longtitudinal plan of care for additional care plan information)   Current Barriers:  [name] Kitchen Knowledge deficit related to basic heart failure pathophysiology and self care management . Knowledge Deficits related to heart failure medications . Cognitive Deficits  Case Manager Clinical Goal(s):  [name] Kitchen Over the next 90 days, patient will verbalize understanding of Heart Failure Action Plan and when to call doctor . Over the next 90 days, patient will take all Heart Failure mediations as prescribed . Over the next 90 days, patient will weigh daily and record (notifying MD of 3 lb weight gain over night or 5 lb in a week) . Patient will verbalize two symptoms of CHF exacerbation within the next 90 days.   . Patient will verbalize receiving annual flu vaccine within the next 90  days.   Interventions:  . Basic overview and discussion of pathophysiology of Heart Failure reviewed  . Provided verbal education on low sodium diet . Provided written education on low sodium diet . Advised patient to weigh each morning after emptying bladder . Discussed importance of daily weight and advised patient to weigh and record daily . Reviewed role of diuretics in prevention of fluid overload and management of heart failure . Discussed all medications that patient is taking and their purpose  Patient Self Care Activities:  . Takes Heart Failure Medications as prescribed . Weighs daily and record (notifying MD of 3 lb weight gain over night or 5 lb in a week) . Verbalizes understanding of and follows CHF Action Plan . Adheres to low sodium diet  Initial goal  documentation     .  HEMOGLOBIN A1C < 7.0 (pt-stated)    .  LDL CALC < 100    .  Weight (lb) < 133 lb (60.3 kg)      5% weight loss      Fall Risk Fall Risk  07/01/2020 05/22/2020 04/08/2020 03/31/2020 09/18/2019  Falls in the past year? 1 1 1 1 1   Comment - - - - -  Number falls in past yr: 1 1 1  0 0  Injury with Fall? 0 0 0 0 0  Comment - - - - -  Risk Factor Category  - High Risk (2 or more Points) - - Low Risk (0 Points)  Risk for fall due to : History of fall(s);Impaired balance/gait History of fall(s);Impaired balance/gait;Impaired mobility - - -  Follow up Falls prevention discussed Falls evaluation completed;Falls prevention discussed - - Falls evaluation completed;Education provided  Comment - - - - -   Is the patient's home free of loose throw rugs in walkways, pet beds, electrical cords, etc?   yes      Grab bars in the bathroom? yes      Handrails on the stairs?   yes      Adequate lighting?   yes  Patient rating of health (0-10) scale: 6   Depression Screen PHQ 2/9 Scores 07/01/2020 07/01/2020 03/31/2020 09/18/2019  PHQ - 2 Score 0 0 0 0  PHQ- 9 Score - - 3 -  Exception Documentation - - - -    Cognitive Function Montreal Cognitive Assessment  10/19/2015  Visuospatial/ Executive (0/5) 4  Naming (0/3) 3  Attention: Read list of digits (0/2) 2  Attention: Read list of letters (0/1) 1  Attention: Serial 7 subtraction starting at 100 (0/3) 0  Language: Repeat phrase (0/2) 2  Language : Fluency (0/1) 0  Abstraction (0/2) 2  Delayed Recall (0/5) 0  Orientation (0/6) 6  Total 20  Adjusted Score (based on education) 21   Immunization History  Administered Date(s) Administered  . Fluad Quad(high Dose 65+) 05/25/2020  . Influenza Split 06/17/2011, 04/18/2012  . Influenza Whole 05/17/2007, 06/14/2010  . Influenza,inj,Quad PF,6+ Mos 05/20/2014, 06/04/2015, 03/22/2016, 05/30/2017, 04/12/2018  . Influenza,inj,Quad PF,6-35 Mos 05/03/2013  . Influenza-Unspecified 04/01/2019   . PFIZER SARS-COV-2 Vaccination 09/05/2019, 10/01/2019  . Pneumococcal Conjugate-13 05/20/2014  . Pneumococcal Polysaccharide-23 05/17/2007, 06/01/2013, 03/05/2019  . Td 03/01/2006  . Zoster 08/01/2009   Screening Tests Health Maintenance  Topic Date Due  . OPHTHALMOLOGY EXAM  01/25/2018  . FOOT EXAM  09/25/2019  . HEMOGLOBIN A1C  03/17/2020  . MAMMOGRAM  12/10/2021  . COLONOSCOPY  01/12/2023  . TETANUS/TDAP  03/27/2026  . INFLUENZA VACCINE  Completed  .  DEXA SCAN  Completed  . COVID-19 Vaccine  Completed  . Hepatitis C Screening  Completed  . PNA vac Low Risk Adult  Completed   Cancer Screenings: Lung: Low Dose CT Chest recommended if Age 93-80 years, 30 pack-year currently smoking OR have quit w/in 15years. Patient does not qualify. Breast:  Up to date on Mammogram? Yes   Up to date of Bone Density/Dexa? Yes Colorectal: UTD  Additional Screenings: Hepatitis C Screening: Completed  Plan:  I have mailed you an Advance Directive.  PCP apt made for 07/15/2020 @ 10:00am. Continue your stretching exercises at home! Happy Holidays!  I have personally reviewed and noted the following in the patient's chart:   . Medical and social history . Use of alcohol, tobacco or illicit drugs  . Current medications and supplements . Functional ability and status . Nutritional status . Physical activity . Advanced directives . List of other physicians . Hospitalizations, surgeries, and ER visits in previous 12 months . Vitals . Screenings to include cognitive, depression, and falls . Referrals and appointments  In addition, I have reviewed and discussed with patient certain preventive protocols, quality metrics, and best practice recommendations. A written personalized care plan for preventive services as well as general preventive health recommendations were provided to patient.  This visit was conducted virtually in the setting of the [place] pandemic.    Dorna Bloom,  [place]  07/01/2020    I have reviewed this visit and agree with the documentation.   [name], DO

## 2020-07-02 MED ORDER — MECLIZINE HCL 12.5 MG PO TABS: 6.5000 mg | ORAL_TABLET | Freq: Three times a day (TID) | ORAL | 0 refills | Status: DC | PRN

## 2020-07-02 MED ORDER — METHOCARBAMOL 750 MG PO TABS
750.0000 mg | ORAL_TABLET | ORAL | 0 refills | Status: DC | PRN
Start: 2020-07-02 — End: 2020-07-14

## 2020-07-07 ENCOUNTER — Telehealth: Payer: Self-pay | Admitting: Pulmonary Disease

## 2020-07-07 NOTE — Telephone Encounter (Signed)
lmtcb for pt. I do not see where our office reached out to patient.

## 2020-07-13 NOTE — Telephone Encounter (Signed)
Left detailed message for pt that we do not see anything in chart indicating we called pt but if she needs to call us back with any questions or concerns to feel free. Nothing further needed at this time. Will close encounter.

## 2020-07-14 ENCOUNTER — Other Ambulatory Visit: Payer: Self-pay | Admitting: Family Medicine

## 2020-07-14 DIAGNOSIS — G20 Parkinson's disease: Secondary | ICD-10-CM

## 2020-07-14 MED ORDER — METHOCARBAMOL 750 MG PO TABS: 750.0000 mg | ORAL_TABLET | Freq: Every day | ORAL | 0 refills | Status: DC | PRN

## 2020-07-15 ENCOUNTER — Encounter: Payer: Self-pay | Admitting: Family Medicine

## 2020-07-15 ENCOUNTER — Ambulatory Visit (INDEPENDENT_AMBULATORY_CARE_PROVIDER_SITE_OTHER): Payer: Medicare Other | Admitting: Family Medicine

## 2020-07-15 ENCOUNTER — Other Ambulatory Visit: Payer: Self-pay

## 2020-07-15 VITALS — BP 110/65 | HR 98 | Ht 62.0 in | Wt 134.8 lb

## 2020-07-15 DIAGNOSIS — E118 Type 2 diabetes mellitus with unspecified complications: Secondary | ICD-10-CM | POA: Diagnosis not present

## 2020-07-15 DIAGNOSIS — E785 Hyperlipidemia, unspecified: Secondary | ICD-10-CM

## 2020-07-15 DIAGNOSIS — I10 Essential (primary) hypertension: Secondary | ICD-10-CM

## 2020-07-15 DIAGNOSIS — E119 Type 2 diabetes mellitus without complications: Secondary | ICD-10-CM

## 2020-07-15 DIAGNOSIS — R4189 Other symptoms and signs involving cognitive functions and awareness: Secondary | ICD-10-CM

## 2020-07-15 LAB — POCT GLYCOSYLATED HEMOGLOBIN (HGB A1C): Hemoglobin A1C: 7.2 % — AB (ref 4.0–5.6)

## 2020-07-15 NOTE — Assessment & Plan Note (Signed)
Well-controlled.  Continue with losartan, metoprolol, and spironolactone as is.  Appears to be taking as prescribed, previously was accidentally taking additional losartan.

## 2020-07-15 NOTE — Patient Instructions (Addendum)
It was wonderful to see you today.  Your A1c actually is very stable.  Would encourage you to continue work balancing out each meal to have a good portion of protein, carbohydrate, and fat.  We will work towards getting you scheduled in our geriatric clinic likely in January.   Diet Recommendations for Diabetes  Carbohydrate includes starch, sugar, and fiber.  Of these, only sugar and starch raise blood glucose.  (Fiber is found in fruits, vegetables [especially skin, seeds, and stalks], whole grains, and beans.)   Starchy (carb) foods: Bread, rice, pasta, potatoes, corn, cereal, grits, crackers, bagels, muffins, all baked goods.  (Fruit, milk, and yogurt also have carbohydrate, but most of these foods will not spike your blood sugar as most starchy or sweet foods will.)  A few fruits do cause high blood sugars; use small portions of bananas (limit to 1/2 at a time), grapes, watermelon, and oranges.   Protein foods: Meat, fish, poultry, eggs, dairy foods, and beans such as pinto and kidney beans (beans also provide carbohydrate).   1. Eat at least 3 REAL meals and 1-2 snacks per day. Eat breakfast within the first hour of getting up.  Have something to eat at least every 5 hours while awake.   2. Limit starchy foods to TWO per meal and ONE per snack. ONE portion of a starchy food is equal to the following:   - ONE slice of bread (or its equivalent, such as half of a hamburger bun).   - 1/2 cup of a "scoopable" starchy food such as potatoes or rice.   - 15 grams of Total Carbohydrate as shown on food label.   - Every 4 ounces of a sweet drink (including fruit juice). 3. Include at every lunch and dinner: a protein food, a carb food, and vegetables.   - An ideal lunch or dinner includes twice the volume of veg's as protein or carbohydrate foods.  - Fresh or frozen vegetables are best.   - Keep frozen vegetables on hand for a quick option.

## 2020-07-15 NOTE — Assessment & Plan Note (Signed)
A1c 7.2, relatively well controlled.  On Metformin only.  Will keep regimen as is, discussed increasing physical activity as possible and balancing her meals throughout the day.  Hopeful with some lifestyle modifications that she revealed to maintain an A1c around 7.

## 2020-07-15 NOTE — Progress Notes (Signed)
SUBJECTIVE:   CHIEF COMPLAINT / HPI: DM f/u  Diabetes: She recently saw her ophthalmologist who reported she had diabetes present in her right eye, encouraged tight glucose control.  [facility].  Previous A1c 7.4, usual range around 6-7.  Fasting 150-170s in the morning. Highest in the 200's.  Taking Metformin 1000 mg twice daily with compliance.  Tries to stay physically active.  Eats cornflakes with fruit for breakfast.  Mainly drinks water throughout the day and will have 1-2 glasses of home sweetened tea.  Memory loss: She additionally requests medications for memory loss.  She had discussed this with her neurologist in 04/2020 who recommended further formal neurocognitive testing in person and treating anxiety/depression prior to discussing this further.  They were going to get this scheduled, however patient did not proceed as she previously had neurocognitive testing via telemedicine in spring 2020.  Did not want to do any medications for mood, previously prescribed mirtazapine for mood difficulty/insomnia however she does not remember ever starting this and not currently taking it.  They did noted some anxiety/depression in addition to some cognitive decline.  Today she reports that her her mood has improved since she last was evaluated for the testing.  Does not feel down, depressed, or anxious.  States she is often forgetful, she notices some of her memory gaps and then her family will also tell her that she has forgotten things.  She does not drive, previously got lost when it was dark outside.  She does not do any finances and has not for quite some time.  Family cooks for her.  Walks with a cane.  Her telemetry medicine neurocognitive evaluation is present in the media tab on 03/2019 performed by Dr. Cecelia Byars with neuropsychology.  Dementia diagnosis was based off the present of functional disabilities including largely assistance with IADLs.  Previous MoCA  in 2019 with a score of 23/30.  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 (EF now 60-65%, G2DD), HTN, T2 DM, Parkinson's, chronic pain  OBJECTIVE:   BP 110/65   Pulse 98   Ht 5\' 2"  (1.575 m)   Wt 134 lb 12.8 oz (61.1 kg)   LMP 08/01/2000 (Approximate)   SpO2 93%   BMI 24.66 kg/m   General: Alert, NAD HEENT: NCAT, MMM Cardiac: RRR  Lungs: Clear bilaterally, no increased WOB  Msk: Moves all extremities spontaneously, gait shuffled with cane assistance  Ext: Warm, dry, 2+ distal pulses, mild resting tremor present in R hand  Psych: Normal mood and affect, able to hold normal conversation with good eye contact  Diabetic Foot Exam - Simple   Simple Foot Form Diabetic Foot exam was performed with the following findings: Yes 07/15/2020  5:11 PM  Visual Inspection No deformities, no ulcerations, no other skin breakdown bilaterally: Yes Sensation Testing Intact to touch and monofilament testing bilaterally: Yes Pulse Check Posterior Tibialis and Dorsalis pulse intact bilaterally: Yes Comments Left hammertoe present     ASSESSMENT/PLAN:   Cognitive impairment Full telemedicine neurocognitive evaluation performed in 03/2019 by neuropsychology, 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  Symptoms of memory impairment have been present at least since 2014.  Functional impairment seems to be largely with IADLs only.  While this concern was discuss briefly at the end of her visit, she again today reports she is not interested in any further medications or therapy for mood/insomnia management at this time.  For a more comprehensive and in person evaluation, will have her follow-up in our geriatric clinic here.   Type II diabetes mellitus with complication (HCC) Z6Q 7.2, relatively well controlled.  On Metformin only.  Will keep regimen as is, discussed increasing physical activity as  possible and balancing her meals throughout the day.  Hopeful with some lifestyle modifications that she revealed to maintain an A1c around 7.  Essential hypertension Well-controlled.  Continue with losartan, metoprolol, and spironolactone as is.  Appears to be taking as prescribed, previously was accidentally taking additional losartan.    Follow-up in geriatric clinic and then with myself shortly afterward.  [name], [place]

## 2020-07-15 NOTE — Assessment & Plan Note (Addendum)
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  Symptoms of memory impairment have been present at least since 2014.  Functional impairment seems to be largely with IADLs only.  While this concern was discuss briefly at the end of her visit, she again today reports she is not interested in any further medications or therapy for mood/insomnia management at this time.  For a more comprehensive and in person evaluation, will have her follow-up in our geriatric clinic here.

## 2020-07-27 ENCOUNTER — Ambulatory Visit (INDEPENDENT_AMBULATORY_CARE_PROVIDER_SITE_OTHER): Payer: Medicare Other

## 2020-07-27 DIAGNOSIS — Z95810 Presence of automatic (implantable) cardiac defibrillator: Secondary | ICD-10-CM

## 2020-07-27 DIAGNOSIS — I5022 Chronic systolic (congestive) heart failure: Secondary | ICD-10-CM | POA: Diagnosis not present

## 2020-07-29 ENCOUNTER — Other Ambulatory Visit: Payer: Self-pay | Admitting: *Deleted

## 2020-07-29 NOTE — Patient Instructions (Addendum)
It was great seeing you today!   Regarding your frequent urination:  - Stop by the pharmacy to pick up your prescriptions  - Take antibiotics as prescribed  - Be sure to stay hydrated    If you have questions or concerns please do not hesitate to call at [phone].  Dr. Rushie [facility]     Urinary Tract Infection, Adult  A urinary tract infection (UTI) is an infection of any part of the urinary tract. The urinary tract includes the kidneys, ureters, bladder, and urethra. These organs make, store, and get rid of urine in the body. Your health care provider may use other names to describe the infection. An upper UTI affects the ureters and kidneys (pyelonephritis). A lower UTI affects the bladder (cystitis) and urethra (urethritis). What are the causes? Most urinary tract infections are caused by bacteria in your genital area, around the entrance to your urinary tract (urethra). These bacteria grow and cause inflammation of your urinary tract. What increases the risk? You are more likely to develop this condition if:  You have a urinary catheter that stays in place (indwelling).  You are not able to control when you urinate or have a bowel movement (you have incontinence).  You are female and you: ? Use a spermicide or diaphragm for birth control. ? Have low estrogen levels. ? Are pregnant.  You have certain genes that increase your risk (genetics).  You are sexually active.  You take antibiotic medicines.  You have a condition that causes your flow of urine to slow down, such as: ? An enlarged prostate, if you are female. ? Blockage in your urethra (stricture). ? A kidney stone. ? A nerve condition that affects your bladder control (neurogenic bladder). ? Not getting enough to drink, or not urinating often.  You have certain medical conditions, such as: ? Diabetes. ? A weak disease-fighting system (immunesystem). ? Sickle cell  disease. ? Gout. ? Spinal cord injury. What are the signs or symptoms? Symptoms of this condition include:  Needing to urinate right away (urgently).  Frequent urination or passing small amounts of urine frequently.  Pain or burning with urination.  Blood in the urine.  Urine that smells bad or unusual.  Trouble urinating.  Cloudy urine.  Vaginal discharge, if you are female.  Pain in the abdomen or the lower back. You may also have:  Vomiting or a decreased appetite.  Confusion.  Irritability or tiredness.  A fever.  Diarrhea. The first symptom in older adults may be confusion. In some cases, they may not have any symptoms until the infection has worsened. How is this diagnosed? This condition is diagnosed based on your medical history and a physical exam. You may also have other tests, including:  Urine tests.  Blood tests.  Tests for sexually transmitted infections (STIs). If you have had more than one UTI, a cystoscopy or imaging studies may be done to determine the cause of the infections. How is this treated? Treatment for this condition includes:  Antibiotic medicine.  Over-the-counter medicines to treat discomfort.  Drinking enough water to stay hydrated. If you have frequent infections or have other conditions such as a kidney stone, you may need to see a health care provider who specializes in the urinary tract (urologist). In rare cases, urinary tract infections can cause sepsis. Sepsis is a life-threatening condition that occurs when the body responds to an infection. Sepsis is treated in the hospital with IV antibiotics, fluids,  and other medicines. Follow these instructions at home:  Medicines  Take over-the-counter and prescription medicines only as told by your health care provider.  If you were prescribed an antibiotic medicine, take it as told by your health care provider. Do not stop using the antibiotic even if you start to feel  better. General instructions  Make sure you: ? Empty your bladder often and completely. Do not hold urine for long periods of time. ? Empty your bladder after sex. ? Wipe from front to back after a bowel movement if you are female. Use each tissue one time when you wipe.  Drink enough fluid to keep your urine pale yellow.  Keep all follow-up visits as told by your health care provider. This is important. Contact a health care provider if:  Your symptoms do not get better after 1-2 days.  Your symptoms go away and then return. Get help right away if you have:  Severe pain in your back or your lower abdomen.  A fever.  Nausea or vomiting. Summary  A urinary tract infection (UTI) is an infection of any part of the urinary tract, which includes the kidneys, ureters, bladder, and urethra.  Most urinary tract infections are caused by bacteria in your genital area, around the entrance to your urinary tract (urethra).  Treatment for this condition often includes antibiotic medicines.  If you were prescribed an antibiotic medicine, take it as told by your health care provider. Do not stop using the antibiotic even if you start to feel better.  Keep all follow-up visits as told by your health care provider. This is important. This information is not intended to replace advice given to you by your health care provider. Make sure you discuss any questions you have with your health care provider. Document Revised: 07/05/2018 Document Reviewed: 01/25/2018 Elsevier Patient Education  2020 [employer].

## 2020-07-29 NOTE — Progress Notes (Deleted)
    SUBJECTIVE:   CHIEF COMPLAINT / HPI: uti sx  Concern for UTI:  PERTINENT  PMH / PSH: DM  OBJECTIVE:   LMP 08/01/2000 (Approximate)   ***  ASSESSMENT/PLAN:   No problem-specific Assessment & Plan notes found for this encounter.     Shirlean Mylar, MD [facility]

## 2020-07-29 NOTE — Patient Outreach (Signed)
[place] [facility]) Care Management  [place]  07/29/2020   Veronica Shaw June 02, 1951 [id number] RN Health Coach telephone call to patient.  Hipaa compliance verified. Per patient she is doing good. Patient is weighing daily and if gained 2 pounds or more takes her diuretic. Patient appetite is good. Her family is preparing all meals for her and her husband daily. Patient uses a stationary bike for exercise. She is ambulating with a cane and a walker mostly at night. Fasting blood sugar today is 148. She does not have any swelling in her lower extremities. Per patient she is having some discomfort over her bladder area. Her urine is cloudy and has a foul odor. She is an increase in urinary incontinence. RN Health Coach instructed patient to call PCP or nephrologist and let them know she has a possible UTI today. Patient has agreed to follow up outreach.  Encounter Medications:  Outpatient Encounter Medications as of 07/29/2020  Medication Sig Note  . furosemide (LASIX) 40 MG tablet TAKE 1/2 TABLET EVERY DAY AS NEEDED FOR  FLUID/SWELLING   . Turmeric 500 MG TABS Take 1 tablet by mouth daily.   [name] Kitchen albuterol (PROVENTIL HFA;VENTOLIN HFA) 108 (90 Base) MCG/ACT inhaler Inhale 2 puffs into the lungs every 4 (four) hours as needed for wheezing or shortness of breath.   . Biotin 5000 MCG CAPS Take 1 tablet by mouth daily.    . budesonide-formoterol (SYMBICORT) 160-4.5 MCG/ACT inhaler Inhale 2 puffs into the lungs 2 (two) times daily. (Patient not taking: Reported on 07/01/2020)   . carbidopa-levodopa (SINEMET CR) 50-200 MG tablet Take 1 tablet by mouth at bedtime. (Patient not taking: Reported on 07/01/2020)   . carbidopa-levodopa (SINEMET IR) 25-100 MG tablet TAKE 1 TABLET BY MOUTH THREE TIMES DAILY   . famotidine (PEPCID) 20 MG tablet TAKE 1 TABLET TWICE DAILY   . fluticasone (FLONASE SENSIMIST) 27.5 MCG/SPRAY nasal spray Place 2 sprays into the nose daily.   [name] Kitchen levalbuterol  (XOPENEX) 0.63 MG/3ML nebulizer solution 1 vial every 4-6 hours as needed for wheezing and shortness of breath. 03/31/2020: As needed especially in the winter  . losartan (COZAAR) 25 MG tablet TAKE 1 TABLET EVERY DAY   . meclizine (ANTIVERT) 12.5 MG tablet Take 0.5 tablets (6.25 mg total) by mouth 3 (three) times daily as needed for dizziness.   . Melatonin 10 MG TABS Take by mouth as needed.   . metFORMIN (GLUCOPHAGE) 500 MG tablet Take 2 tablets (1,000 mg total) by mouth 2 (two) times daily with a meal.   . metoprolol succinate (TOPROL-XL) 50 MG 24 hr tablet TAKE 1 TABLET TWICE DAILY   . potassium chloride (KLOR-CON) 10 MEQ tablet Take 1 tablet (10 mEq total) by mouth as needed (take with lasix as needed for swelling).   [name] Kitchen spironolactone (ALDACTONE) 25 MG tablet TAKE 1/2 TABLET EVERY DAY    No facility-administered encounter medications on file as of 07/29/2020.    Functional Status:  In your present state of health, do you have any difficulty performing the following activities: 07/01/2020  Hearing? N  Vision? N  Difficulty concentrating or making decisions? Y  Walking or climbing stairs? Y  Comment Rehab has helped  Dressing or bathing? N  Doing errands, shopping? N  Preparing Food and eating ? N  Using the Toilet? N  In the past six months, have you accidently leaked urine? Y  Do you have problems with loss of bowel control? Y  Managing your Medications? N  Managing your Finances? N  Housekeeping or managing your Housekeeping? N  Some recent data might be hidden    Fall/Depression Screening: Fall Risk  07/29/2020 07/01/2020 05/22/2020  Falls in the past year? 1 1 1   Comment - - -  Number falls in past yr: 1 1 1   Injury with Fall? 0 0 0  Comment - - -  Risk Factor Category  High Risk (2 or more Points) - High Risk (2 or more Points)  Risk for fall due to : History of fall(s);Impaired balance/gait;Impaired mobility History of fall(s);Impaired balance/gait History of  fall(s);Impaired balance/gait;Impaired mobility  Follow up Falls evaluation completed;Falls prevention discussed Falls prevention discussed Falls evaluation completed;Falls prevention discussed  Comment - - -   PHQ 2/9 Scores 07/01/2020 07/01/2020 03/31/2020 09/18/2019 09/17/2019 07/09/2019 04/02/2019  PHQ - 2 Score 0 0 0 0 0 0 0  PHQ- 9 Score - - 3 - - - -  Exception Documentation - - - - - - -    Assessment:  Goals Addressed            This Visit's Progress   . (THN)Make and Keep All Appointments   On track    Timeframe:  Long-Range Goal Priority:  Medium Start Date:    [id number]                         Expected End Date:   [id number]                   Follow Up Date [id number]   - call to cancel if needed - keep a calendar with prescription refill dates - keep a calendar with appointment dates    Why is this important?   Part of staying healthy is seeing the doctor for follow-up care.  If you forget your appointments, there are some things you can do to stay on track.    Notes:  Patient has been keeping her appointment    . (THN)Track and Manage Activity and Exertion   On track    Timeframe:  Long-Range Goal Priority:  Medium Start Date:   [id number]                          Expected End Date:                      Follow Up Date [id number]   - meet with physical therapist - pace activity allowing for rest    Why is this important?   Exercising is very important when managing your heart failure.  It will help your heart get stronger.    Notes:     . (THN)Track and Manage Fluids and Swelling   On track    Timeframe:  Long-Range Goal Priority:  High Start Date:   [id number]                          Expected End Date:     [id number]                 Follow Up Date [id number]   - call office if I gain more than 2 pounds in one day or 5 pounds in one week - keep legs up while sitting - track weight in diary - use salt in moderation - watch for swelling in feet, ankles and legs every  day -  weigh myself daily    Why is this important?   It is important to check your weight daily and watch how much salt and liquids you have.  It will help you to manage your heart failure.    Notes:  Patient is monitoring daily wt    . (THN)Track and Manage Symptoms   On track    Timeframe:  Long-Range Goal Priority:  High Start Date:10222021                             Expected End Date: [id number]                     Follow Up Date [id number]   - develop a rescue plan - follow rescue plan if symptoms flare-up - know when to call the doctor - track symptoms and what helps feel better or worse    Why is this important?   You will be able to handle your symptoms better if you keep track of them.  Making some simple changes to your lifestyle will help.  Eating healthy is one thing you can do to take good care of yourself.    Notes:        Plan:  Follow-up:  Patient agrees to Care Plan and Follow-up. Patient will continue to monitor BP, cbg and weight daily Patient will continue exercise routine Patient will follow up with PCP or Nephrologist for possible UTI RN will follow up outreach within the month of March Rn sent update assessment to PCP  Gean Maidens BSN RN [facility] [phone]

## 2020-07-29 NOTE — Patient Instructions (Signed)
Goals Addressed            This Visit's Progress   . (THN)Make and Keep All Appointments   On track    Timeframe:  Long-Range Goal Priority:  Medium Start Date:    [id number]                         Expected End Date:   [id number]                   Follow Up Date [id number]   - call to cancel if needed - keep a calendar with prescription refill dates - keep a calendar with appointment dates    Why is this important?   Part of staying healthy is seeing the doctor for follow-up care.  If you forget your appointments, there are some things you can do to stay on track.    Notes:  Patient has been keeping her appointment    . (THN)Track and Manage Activity and Exertion   On track    Timeframe:  Long-Range Goal Priority:  Medium Start Date:   [id number]                          Expected End Date:                      Follow Up Date [id number]   - meet with physical therapist - pace activity allowing for rest    Why is this important?   Exercising is very important when managing your heart failure.  It will help your heart get stronger.    Notes:     . (THN)Track and Manage Fluids and Swelling   On track    Timeframe:  Long-Range Goal Priority:  High Start Date:   [id number]                          Expected End Date:     [id number]                 Follow Up Date [id number]   - call office if I gain more than 2 pounds in one day or 5 pounds in one week - keep legs up while sitting - track weight in diary - use salt in moderation - watch for swelling in feet, ankles and legs every day - weigh myself daily    Why is this important?   It is important to check your weight daily and watch how much salt and liquids you have.  It will help you to manage your heart failure.    Notes:  Patient is monitoring daily wt    . (THN)Track and Manage Symptoms   On track    Timeframe:  Long-Range Goal Priority:  High Start Date:10222021                             Expected End Date: [id number]                      Follow Up Date [id number]   - develop a rescue plan - follow rescue plan if symptoms flare-up - know when to call the doctor - track symptoms and what helps feel better or worse    Why is this important?   You will be able to handle  your symptoms better if you keep track of them.  Making some simple changes to your lifestyle will help.  Eating healthy is one thing you can do to take good care of yourself.    Notes:

## 2020-07-29 NOTE — Progress Notes (Signed)
EPIC Encounter for ICM Monitoring  Patient Name: Veronica Shaw is a 69 y.o. female Date: 07/29/2020 Primary Care Physican: Allayne Stack, DO Primary Cardiologist:Cooper/Weaver PA Electrophysiologist: Ladona Ridgel 11/15/2021OfficeWeight: 135lbs  Spoke with patient and reports feeling well at this time.  Denies fluid symptoms.    OptiVolThoracic impedancenormal.  Prescribed:Furosemide40 mgTake 0.5 tablets (20 mg total) by moutheverydayas needed for fluid (For swelling).   Labs: 03/31/2020 Creatinine 1.13, BUN 18, Potassium 3.8, Sodium 141, GFR 49-57 02/04/2020 Creatinine1.33, BUN21, Potassium4.8, Sodium143, WSF68-12 11/01/2019 Creatinine1.13, BUN19, Potassium4.2, Sodium143, XNT70-01 A complete set of results can be found in Results Review.  Recommendations: No changes and encouraged to call if experiencing any fluid symptoms.  Follow-up plan: ICM clinic phone appointment on2/01/2021. 91 day device clinic remote transmission1/27/2022.   EP/Cardiology Office Visits:Recalls 4/2/2022with Dr. Excell Seltzer and 02/05/2021 for Dr Ladona Ridgel.   Copy of ICM check sent to Dr.Taylor.   3 month ICM trend: 07/27/2020    1 Year ICM trend:       Karie Soda, RN 07/29/2020 1:38 PM

## 2020-07-30 ENCOUNTER — Other Ambulatory Visit: Payer: Self-pay

## 2020-07-30 ENCOUNTER — Ambulatory Visit (INDEPENDENT_AMBULATORY_CARE_PROVIDER_SITE_OTHER): Payer: Medicare Other | Admitting: Family Medicine

## 2020-07-30 VITALS — BP 114/60 | HR 81 | Ht 61.0 in | Wt 136.1 lb

## 2020-07-30 DIAGNOSIS — R350 Frequency of micturition: Secondary | ICD-10-CM | POA: Diagnosis not present

## 2020-07-30 LAB — POCT URINALYSIS DIP (MANUAL ENTRY)
Bilirubin, UA: NEGATIVE
Blood, UA: NEGATIVE
Glucose, UA: NEGATIVE mg/dL
Ketones, POC UA: NEGATIVE mg/dL
Leukocytes, UA: NEGATIVE
Nitrite, UA: NEGATIVE
Protein Ur, POC: NEGATIVE mg/dL
Spec Grav, UA: >=1.03 — AB (ref 1.010–1.025)
Urobilinogen, UA: 0.2 E.U./dL
pH, UA: 5.5 (ref 5.0–8.0)

## 2020-07-30 MED ORDER — SULFAMETHOXAZOLE-TRIMETHOPRIM 800-160 MG PO TABS: 1.0000 | ORAL_TABLET | Freq: Two times a day (BID) | ORAL | 0 refills | Status: DC

## 2020-07-30 NOTE — Assessment & Plan Note (Addendum)
Pt is symptomatic. UA unremarkable. Unable to obtain urine culture as pt was not able to produce enough urine. Will treat with Bactrim as pt has allergy to PCNs. Follow up with PCP if not improving.

## 2020-07-30 NOTE — Progress Notes (Signed)
   SUBJECTIVE:   CHIEF COMPLAINT / HPI:   Chief Complaint  Patient presents with  . Urinary Frequency     Veronica Shaw is a 69 y.o. female here for urinary compliant.  Urinary Frequency: Patient stated urinary frequency started a week ago. Patient reports urgency, lower abdominal discomfort, and low back pain. Denies fever, blood in urine, nausea, vaginal discharge  and vaginal bleeding. She has tried AZO for her sx which helped somewhat. Reports no antibiotics in the last 30 days. No UTIs in the last 12 months but states she normally gets them twice a year. Patient is postmenopausal.  She has hx of CKD.    PERTINENT  PMH / PSH: reviewed and updated as appropriate   OBJECTIVE:   BP 114/60   Pulse 81   Ht 5\' 1"  (1.549 m)   Wt 136 lb 2 oz (61.7 kg)   LMP 08/01/2000 (Approximate)   SpO2 96%   BMI 25.72 kg/m    GEN: pleasant elderly female, in no acute distress  CV: regular rate and rhythm, defibrillator palpable on left chest wall RESP: no increased work of breathing, lungs clear ABD: Bowel sounds present. Soft, non-distended with mild superpubic tenderness  MSK: uses a walking cane, slow gait SKIN: warm, dry    ASSESSMENT/PLAN:   Frequent urination Pt is symptomatic. UA unremarkable. Unable to obtain urine culture as pt was not able to produce enough urine. Will treat with Bactrim as pt has allergy to PCNs. Follow up with PCP if not improving.      09/29/2000, DO PGY-2, [facility] 07/30/2020

## 2020-08-03 ENCOUNTER — Telehealth: Payer: Self-pay | Admitting: Family Medicine

## 2020-08-03 NOTE — Telephone Encounter (Addendum)
Called patient and informed of geriatric clinic appt with Dr. McDiarmid on date below.   ----- Message from Henri Medal, CMA sent at 07/28/2020 12:06 PM EST -----  Please let patient know I have scheduled her appt for 08-20-20 at 1:45 with Dr. McDiarmid.  Please bring all of her medications and be prepared to complete some paperwork at that time also.  Thanks  [employer]

## 2020-08-18 ENCOUNTER — Ambulatory Visit: Payer: Medicare Other | Admitting: Pulmonary Disease

## 2020-08-18 DIAGNOSIS — N1831 Chronic kidney disease, stage 3a: Secondary | ICD-10-CM | POA: Insufficient documentation

## 2020-08-20 ENCOUNTER — Ambulatory Visit (INDEPENDENT_AMBULATORY_CARE_PROVIDER_SITE_OTHER): Payer: Medicare Other | Admitting: Family Medicine

## 2020-08-20 ENCOUNTER — Other Ambulatory Visit: Payer: Self-pay

## 2020-08-20 VITALS — BP 110/60 | HR 75 | Ht 61.0 in | Wt 135.2 lb

## 2020-08-20 DIAGNOSIS — R296 Repeated falls: Secondary | ICD-10-CM

## 2020-08-20 DIAGNOSIS — M47819 Spondylosis without myelopathy or radiculopathy, site unspecified: Secondary | ICD-10-CM

## 2020-08-20 DIAGNOSIS — H547 Unspecified visual loss: Secondary | ICD-10-CM | POA: Diagnosis not present

## 2020-08-20 DIAGNOSIS — Z7189 Other specified counseling: Secondary | ICD-10-CM | POA: Diagnosis not present

## 2020-08-20 DIAGNOSIS — F0390 Unspecified dementia without behavioral disturbance: Secondary | ICD-10-CM | POA: Diagnosis not present

## 2020-08-20 DIAGNOSIS — M542 Cervicalgia: Secondary | ICD-10-CM

## 2020-08-20 DIAGNOSIS — J9611 Chronic respiratory failure with hypoxia: Secondary | ICD-10-CM

## 2020-08-20 DIAGNOSIS — N1831 Chronic kidney disease, stage 3a: Secondary | ICD-10-CM

## 2020-08-20 DIAGNOSIS — G20 Parkinson's disease: Secondary | ICD-10-CM

## 2020-08-20 DIAGNOSIS — M47812 Spondylosis without myelopathy or radiculopathy, cervical region: Secondary | ICD-10-CM

## 2020-08-20 MED ORDER — FLONASE SENSIMIST 27.5 MCG/SPRAY NA SUSP: 2.0000 | Freq: Every day | NASAL | 2 refills | Status: DC

## 2020-08-20 NOTE — Progress Notes (Signed)
S: Pharmacy consulted to complete medication reconciliation for geriatric clinic. Patient arrives in good spirits alone. Medication reconciliation completed by patient. Patient brought in some of her medications including prescription and OTC vitamins. Patient was able to confirm medications and reports her granddaughter fills her pillbox every 2 weeks. Pillbox is scheduled 4 times daily (morning, noon, lunch, dinner) for every 2 weeks. Medication adherence appears fair. Patient is able to describe what her medication tablets look like. She reports only taking Sinemet IR TID; not taking CR formulation due to cost - reports neurologist is aware. Reports taking Pepcid TID instead of BID as prescribed. Reports taking Lasix ~3x/week with potassium supplements. Reports taking metformin 500 mg BID instead of 2 tablets BID as prescribed due to dizziness in the mornings. Reports switching from [facility] to Symbicort with improvements in breathing and 0 asthma exacerbations. Reports taking melatonin 5 mg daily, however claims "she needs something stronger" (having problems with sleep). Reports taking OTC 1 tablet daily of apple cider vinegar gummy and ultrea COQ10. Additionally, reports using 2L of oxygen at bedtime for the last 6 months.   Plan: 1. Recommend Toprol XL (metoprolol succinate) 100 mg daily to reduce pill burden (currently taking Toprol XL 50 mg BID) 2. Recommend Metformin XR 500 mg - 2 tablets once daily to reduce GI side effects and pill burden (currently taking metformin 500 mg BID) 2. Counseled to take Pepcid 20 mg BID as prescribed. 4. Provided refills for fluticasone nasal spray   Patient seen by Toma Aran, PharmD Candidate and Lorel Monaco, PharmD, BCPS, PGY2 Pharmacy Resident

## 2020-08-20 NOTE — Patient Instructions (Addendum)
Thank you for coming into the [facility].   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  This is a condition that can make it hard to remember and think quickly. The medications for dementia slow how fast dementia gets worse, but it only helps a little.  It slows the decrease 3 points out of 70 points over a year.  It also has side effects, like nausea and diarrhea.  It is also associated withhaving to get pace makers placed.   If you decide to start the medication, like donepezil, let our office know.  Dr Tiny Rietz will send in a prescription for it.   We have set up a referral to specialists called [facility].  They should be able to provide the type of muscle pain site, called trigger points, in your neck.  They are located near the physical therapists' office on [address].   If you decide to start the one tablet of the higher dose Sinemet prescribed by Dt Tat, let our office know and we can send the prescription to your pharmacy.  Dr Tat had started this higher dose of one tablet at night to help with muscle crampiing.    Please follow up with your family doctor, Dr Enos Fling, in 4 to 6 weeks.  Bring your health care power of attorney paperwork with you.  Our office can notarize the document for you and put it in your medical record.

## 2020-08-21 ENCOUNTER — Encounter: Payer: Self-pay | Admitting: Family Medicine

## 2020-08-21 DIAGNOSIS — F0390 Unspecified dementia without behavioral disturbance: Secondary | ICD-10-CM | POA: Insufficient documentation

## 2020-08-21 DIAGNOSIS — K921 Melena: Secondary | ICD-10-CM

## 2020-08-21 DIAGNOSIS — H547 Unspecified visual loss: Secondary | ICD-10-CM | POA: Insufficient documentation

## 2020-08-21 DIAGNOSIS — Z7189 Other specified counseling: Secondary | ICD-10-CM | POA: Insufficient documentation

## 2020-08-21 HISTORY — DX: Melena: K92.1

## 2020-08-21 NOTE — Assessment & Plan Note (Addendum)
Falling about once a week. Denies injuries. Mrs Veronica Shaw feels it is due to losing balance.  She stopped her OP PT 06/30/20 for falls -- Patient requested DC as husband to have surgery - she would like to start PT back in May.    We encouraged use of her walker in the house.  Osteopenia on DEXA 2016. Consider checking serum 25(OH)Vit D next ov. Recommend Vitamin D supplementation and calcium supplementation Consider repeat DEXA  Follow up with ophthalmology about impaired right eye vision.

## 2020-08-21 NOTE — Assessment & Plan Note (Signed)
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 Patient recalls receiving trigger point injections several years ago that helped reduce her neck pain a great deal.   Referral to [facility] for neck pain assessment and treatment.   Patient is not interested in OP PT at this time. We demonstrated use of scapular squeezes HEP to counter her forward thoracic and cervical flexion.

## 2020-08-21 NOTE — Progress Notes (Signed)
[facility]:   Patient is accompanied by: patient Primary caregiver: husband daughter Christianne Dolin) and granddaughter Gerlene Fee) Patient's lives with their spouse. Patient information was obtained from patient, past medical records, radiology reports, and [facility] Keane Scrape, PhD) from 02/12/19), most recent office visit with Dr Tat (Neuro) History/Exam limitations: none. Primary Care Provider: [name], DO Referring provider: Darrelyn Hillock Reason for referral:  Chief Complaint  Patient presents with   Memory Loss    Patient's Care Team Patient Care Team: Katy Apo, MD as Consulting Physician (Ophthalmology) Tat, Eustace Quail, DO as Consulting Physician (Neurology) Tanda Rockers, MD as Consulting Physician (Pulmonary Disease)  ----------------------------------------------------------------------------------------------------------------------------------------------------------------------------------------------------------------------------------------------------------------   HPI by problems:  Chief Complaint  Patient presents with   Memory Loss    HPI   Ms Veronica Shaw, right handed, neuropsychology evlaution (video) with Keane Scrape, PhD, with [facility] evaluation 02/12/19.  Mrs Veronica Shaw had adjusted MoCA-Blind 22/30 score along a battery of other cognitive and functional tests. Dr Cecelia Byars assessed Mrs Veronica Shaw as having an early dementia, primarily based on progressive decline iADL abilities. She stage her as mild-to-moderate deficits including working memory, fluency, encoding and recall of unstructured/non-contextualized information.  Memory is most characterized by retrieval deficit though benefit of cues is variable.   Cognitive impairment concern  Are there problems with thinking?  memory loss and getting disoriented Difficulty completing task particularly if briefly  interrupted, driving wrong side of road (no longer driving), getting lost near her home, forgetting to pay bills, leaving stove on, forgetting date  When were the changes first noticed?  several year ago  Did this change occur abruptly or gradually?  gradual  How have the changes progressed since then?  gradually worsening  Has there been any tremors or abnormal movements?  yes  Have they had in hallucinations or delusions:  no  Do they still have interests or activities they enjor doing?  no  How has their appetite been lately?  show no change  How has their sleep been lately?  Awakens with pain in neck bilaterally.  Cramping that was present previously has resolved.    Problems with Judgment, e.g., problem making decisions, bad financial decisions, problems with thinking?  yes; forgetting to pay bills   Less interested in hobbies or previously enjoyed activities?   no;    Problem remembering things about family and friends e.g. names,  occupations, birthdays, addresses?   yes.  Trouble recalling persons' names - especially if not in their familiar context;   Problem remembering conversations or news events a few days later?  yes;   Problem remembering what day and month it is? yes;   Problem with losing things?  no;   Problem learning to use a new gadget or machine around the house, e.g., cell phones, computer, microwave, remote control?  yes, recently acquired a new washer/dryer - her dgt and gdgt operate the machines;   Problem with handling money for shopping?  yes, no longer handles money or fiances  Problem handling financial matters, e.g. their pension, checking, credit cards, dealing with the bank?  yes;   Problem with getting lost in familiar places?   yes;   Geriatric Depression Scale:  4 / 15   Outpatient Encounter Medications as of 08/20/2020  Medication Sig   APPLE CIDER VINEGAR PO Take 1 Piece by mouth daily.   Biotin 5000 MCG CAPS Take  1 tablet by mouth daily.    budesonide-formoterol (SYMBICORT)  160-4.5 MCG/ACT inhaler Inhale 2 puffs into the lungs 2 (two) times daily.   carbidopa-levodopa (SINEMET IR) 25-100 MG tablet TAKE 1 TABLET BY MOUTH THREE TIMES DAILY   famotidine (PEPCID) 20 MG tablet TAKE 1 TABLET TWICE DAILY   furosemide (LASIX) 40 MG tablet TAKE 1/2 TABLET EVERY DAY AS NEEDED FOR  FLUID/SWELLING   losartan (COZAAR) 25 MG tablet TAKE 1 TABLET EVERY DAY   meclizine (ANTIVERT) 12.5 MG tablet Take 0.5 tablets (6.25 mg total) by mouth 3 (three) times daily as needed for dizziness.   Melatonin 10 MG TABS Take by mouth as needed.   metFORMIN (GLUCOPHAGE) 500 MG tablet Take 2 tablets (1,000 mg total) by mouth 2 (two) times daily with a meal.   metoprolol succinate (TOPROL-XL) 50 MG 24 hr tablet TAKE 1 TABLET TWICE DAILY   OXYGEN Inhale into the lungs at bedtime. Inhale 2L into lungs   potassium chloride (KLOR-CON) 10 MEQ tablet 10 mEq 1 tablet   spironolactone (ALDACTONE) 25 MG tablet TAKE 1/2 TABLET EVERY DAY   Turmeric 400 MG CAPS Take 1 tablet by mouth daily.   Ubiquinone (ULTRA COQ10 PO) Take 1 tablet by mouth daily.   [DISCONTINUED] fluticasone (FLONASE SENSIMIST) 27.5 MCG/SPRAY nasal spray Place 2 sprays into the nose daily.   [DISCONTINUED] Turmeric 500 MG TABS Take 1 tablet by mouth daily.   albuterol (PROVENTIL HFA;VENTOLIN HFA) 108 (90 Base) MCG/ACT inhaler Inhale 2 puffs into the lungs every 4 (four) hours as needed for wheezing or shortness of breath. (Patient not taking: Reported on 08/20/2020)   albuterol (PROVENTIL) (2.5 MG/3ML) 0.083% nebulizer solution 180 (90 Base) mcg/act 2 puffs every 6 hours as needed (Patient not taking: Reported on 08/20/2020)   carbidopa-levodopa (SINEMET CR) 50-200 MG tablet Take 1 tablet by mouth at bedtime. (Patient not taking: Reported on 08/20/2020)   EPINEPHrine 0.3 mg/0.3 mL IJ SOAJ injection  (Patient not taking: Reported on 08/20/2020)   fluticasone  (FLONASE SENSIMIST) 27.5 MCG/SPRAY nasal spray Place 2 sprays into the nose daily.   levalbuterol (XOPENEX) 0.63 MG/3ML nebulizer solution 1 vial every 4-6 hours as needed for wheezing and shortness of breath. (Patient not taking: Reported on 08/20/2020)   [DISCONTINUED] Metoprolol Succinate 100 MG CS24 50mg  1 tablet   [DISCONTINUED] mometasone-formoterol (DULERA) 100-5 MCG/ACT AERO 2 puffs (Patient not taking: Reported on 08/20/2020)   [DISCONTINUED] potassium chloride (KLOR-CON) 10 MEQ tablet Take 1 tablet (10 mEq total) by mouth as needed (take with lasix as needed for swelling).   [DISCONTINUED] sulfamethoxazole-trimethoprim (SEPTRA DS) 800-160 MG tablet Take 1 tablet by mouth 2 (two) times daily. (Patient not taking: Reported on 08/20/2020)   No facility-administered encounter medications on file as of 08/20/2020.    History Patient Active Problem List   Diagnosis Date Noted   Chronic kidney disease, stage 3a ([place]) 08/18/2020    Priority: High   Chronic respiratory failure with hypoxia ([place]) 12/18/2019    Priority: High   Hyperlipidemia associated with type 2 diabetes mellitus ([place]) 03/05/2019    Priority: High   Cognitive impairment 10/19/2017    Priority: High   NICM (nonischemic cardiomyopathy) ([place]) 04/23/2014    Priority: High   Arthritis or polyarthritis, rheumatoid ([place]) 03/12/2014    Priority: High   Heart Failure with Recovered Ejection Fraction, G2DD 05/31/2013    Priority: High   Parkinson disease ([place]) 05/21/2013    Priority: High   Diabetic peripheral neuropathy ([place]) 12/09/2011    Priority: High   Type II diabetes mellitus with complication (  [place]) 09/28/2006    Priority: High   Hypertension associated with diabetes ([place]) 09/28/2006    Priority: High   Mild persistent asthma in adult without complication 123456    Priority: Medium   ICD (implantable cardioverter-defibrillator), dual, in situ 03/06/2014    Priority: Medium   Vertigo 03/03/2013     Priority: Medium   GERD (gastroesophageal reflux disease) 09/27/2012    Priority: Medium   Chronic rhinitis 12/18/2019    Priority: Low   Osteopenia 07/10/2015    Priority: Low   Spondyloarthropathy 04/04/2012    Priority: Low   Chronic urticaria 05/27/2011    Priority: Low   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 05/20/2011    Priority: Low   Frequent urination 07/30/2020   Past Medical History:  Diagnosis Date   Asthma    Back pain 09/18/2019   Cervical disc disorder with radiculopathy of cervical region 09/24/2018   History of Chronic systolic CHF (congestive heart failure) ([place])    a. cMRI 4/15: EF 34% and findings - c/w NICM, normal RV size and function (RVEF 61%), Mild MR // b. Echo 2/15:  EF 30-35%, diff HK, ant-sept AK, Gr 2 DD, mild MR, trivial TR  //  c. Echo 5/17: EF 20-25%, severe diffuse HK, marked systolic dyssynchrony, grade 1 diastolic dysfunction, mild MR  //  d. [place] 5/17: Fick CO 2.9, RVSP 19, PASP 15, PW mean 2, low filing pressures and preserved CO    Diabetes mellitus    Diabetic peripheral neuropathy ([place]) 12/09/2011   Flu 10/17/2017   Gastritis    History of nuclear stress test    Myoview 5/18: EF 49, no ischemia, inferoseptal defect c/w LBBB artifact (intermediate risk due to EF < 50).   HTN (hypertension)    Hyperlipidemia    Hyperlipidemia associated with type 2 diabetes mellitus ([place]) 03/05/2019   Hypertension associated with diabetes ([place]) 09/28/2006   Qualifier: Diagnosis of  By: [name]     Major depression 03/31/2013   NICM (nonischemic cardiomyopathy) ([place])    a. Nuclear 5/13: Normal stress nuclear study. LV Ejection Fraction: 58%  //  b. LHC 10/14: Minor luminal irregularity in prox LAD, EF 35%    Parkinson disease (HCC)    Plantar fasciitis    Plantar fasciitis, bilateral 06/15/2012   Rosacea 05/29/2009   Qualifier: Diagnosis of  By: [name] MD, Matthew     Sleep apnea    was retested and no longer had it and so d/c CPAP    Syncope    Thoracic or lumbosacral neuritis or radiculitis, unspecified 07/03/2013   Type II diabetes mellitus with complication ([place]) 99991111   Diabetic eye exam done by [facility] Op[facility]mology: Dr. Prudencio Burly on 11/08/13: no diabetic retinopathy. Repeat in 1 year     Urticaria    Past Surgical History:  Procedure Laterality Date   BREAST EXCISIONAL BIOPSY Left 1970   benign cyst   BREAST SURGERY Left    BUNIONECTOMY     CARDIAC CATHETERIZATION N/A 12/16/2015   Procedure: Right Heart Cath;  Surgeon: Sherren Mocha, MD;  Location: [facility];  Service: Cardiovascular;  Laterality: N/A;   CHOLECYSTECTOMY     eye lid surgery Bilateral 05/09/2019   IMPLANTABLE CARDIOVERTER DEFIBRILLATOR IMPLANT  11-25-13   MDT dual chamber ICD implanted by [name] for primary prevention   IMPLANTABLE CARDIOVERTER DEFIBRILLATOR IMPLANT N/A 11/25/2013   Procedure: IMPLANTABLE CARDIOVERTER DEFIBRILLATOR IMPLANT;  Surgeon: [name], MD;  Location[facility] C[facility] LAB;  Service: Cardiovascular;  Laterality:  N/A;   LEFT AND RIGHT HEART CATHETERIZATION WITH CORONARY ANGIOGRAM N/A 05/31/2013   Procedure: LEFT AND RIGHT HEART CATHETERIZATION WITH CORONARY ANGIOGRAM;  Surgeon: [name], MD;  Location: [facility] CATH LAB;  Service: Cardiovascular;  Laterality: N/A;   TONSILLECTOMY     TUBAL LIGATION     Family History  Problem Relation Age of Onset   Coronary artery disease Father        Died age 44   Heart attack Father    Diabetes Father    Coronary artery disease Mother        Died age 20   Heart attack Mother    Diabetes Mother    Parkinson's disease Sister    Heart disease Sister    Hepatitis C Sister    Diabetes Sister    Diabetes Son 49       T1DM   Healthy Daughter    Diabetes Sister    Heart disease Sister    Diabetes Sister    Heart disease Sister    Breast cancer Neg Hx    Social History   Socioeconomic History   Marital status: Married    Spouse  name: [name]    Number of children: 2   Years of education: 12   Highest education level: 12th grade  Occupational History   Occupation: retired    Comment: CNA  Tobacco Use   Smoking status: Never Smoker   Smokeless tobacco: Never Used   Tobacco comment: exposed to smoke during child hood (parents)  Vaping Use   Vaping Use: Never used  Substance and Sexual Activity   Alcohol use: No    Alcohol/week: 0.0 standard drinks   Drug use: No   Sexual activity: Yes    Birth control/protection: Post-menopausal  Other Topics Concern   Not on file  Social History Narrative   Patient lives with her husband [name].   Patient enjoys spending time with her family, cooking, and her 2 dogs.    Patient has one daughter who lives locally, and one son who lives in [place].   Social Determinants of Health   Financial Resource Strain: Low Risk    Difficulty of Paying Living Expenses: Not hard at all  Food Insecurity: No Food Insecurity   Worried About Charity fundraiser in the Last Year: Never true   [place] in the Last Year: Never true  Transportation Needs: No Transportation Needs   Lack of Transportation (Medical): No   Lack of Transportation (Non-Medical): No  Physical Activity: Sufficiently Active   Days of Exercise per Week: 7 days   Minutes of Exercise per Session: 30 min  Stress: No Stress Concern Present   Feeling of Stress : Not at all  Social Connections: Moderately Integrated   Frequency of Communication with Friends and Family: More than three times a week   Frequency of Social Gatherings with Friends and Family: More than three times a week   Attends Religious Services: More than 4 times per year   Active Member of [employer] or Organizations: No   Attends Music therapist: Never   Marital Status: Married      Cardiovascular Risk Factors: Hypertension, Lipids, NIDDM and CKD3  Educational History: 12 years formal  education Personal History of Seizures: No -  Personal History of Stroke: No -  Personal History of Head Trauma: No -  Personal History of Psychiatric Disorders: No -  Family History of Dementia: Yes - mother, grandmother,  and sister   Basic Activities of Daily Living  Dressing: Self-care Eating: Self-care Ambulation: Self-care Toileting: Self-care Bathing: Self-care  Instrumental Activities of Daily Living Shopping: Total assistance House/Yard Work: Education officer, environmental of medications: Total assistance Finances: Total assistance Telephone: Self-care Transportation: Partial assistance   Caregivers in home:  husband    FALLS in last five office visits:  Fall Risk  07/29/2020 07/01/2020 05/22/2020 04/08/2020 03/31/2020  Falls in the past year? 1 1 1 1 1   Comment - - - - -  Number falls in past yr: 1 1 1 1  0  Injury with Fall? 0 0 0 0 0  Comment - - - - -  Risk Factor Category  High Risk (2 or more Points) - High Risk (2 or more Points) - -  Risk for fall due to : History of fall(s);Impaired balance/gait;Impaired mobility History of fall(s);Impaired balance/gait History of fall(s);Impaired balance/gait;Impaired mobility - -  Follow up Falls evaluation completed;Falls prevention discussed Falls prevention discussed Falls evaluation completed;Falls prevention discussed - -  Comment - - - - -    Health Maintenance reviewed: Immunization History  Administered Date(s) Administered   Fluad Quad(high Dose 65+) 05/25/2020   Influenza Split 06/17/2011, 04/18/2012   Influenza Whole 05/17/2007, 06/14/2010   Influenza,inj,Quad PF,6+ Mos 05/20/2014, 06/04/2015, 03/22/2016, 05/30/2017, 04/12/2018   Influenza,inj,Quad PF,6-35 Mos 05/03/2013   Influenza-Unspecified 04/01/2019   PFIZER(Purple Top)SARS-COV-2 Vaccination 09/05/2019, 10/01/2019   Pneumococcal Conjugate-13 05/20/2014   Pneumococcal Polysaccharide-23 05/17/2007, 06/01/2013, 03/05/2019   Td 03/01/2006    Zoster 08/01/2009   Health Maintenance Topics with due status: Overdue     Topic Date Due   OPHTHALMOLOGY EXAM 01/25/2018   COVID-19 Vaccine 04/02/2020   Geriatric Syndromes: Constipation yes  Laxative use:no   Incontinence yes  Nocturia: no Dizziness yes   Syncope no  Balance impairment:yes    Skin problems no   Visual Impairment yes, right eye  Hearing impairment no Impaired Memory or Cognition yes   Sleep problems yes   Weight loss no Drug Misadventure: no   Joint pain: yes, neck stiffness and chronic pain  Vital Signs Weight: 135 lb 4 oz (61.3 kg) Body mass index is 25.56 kg/m. CrCl cannot be calculated (Patient's most recent lab result is older than the maximum 21 days allowed.). Body surface area is 1.62 meters squared. Vitals:   08/20/20 1338  BP: 110/60  Pulse: 75  SpO2: 95%  Weight: 135 lb 4 oz (61.3 kg)  Height: 5\' 1"  (1.549 m)   Wt Readings from Last 3 Encounters:  08/20/20 135 lb 4 oz (61.3 kg)  07/30/20 136 lb 2 oz (61.7 kg)  07/15/20 134 lb 12.8 oz (61.1 kg)    Hearing Screening   125Hz  250Hz  500Hz  1000Hz  2000Hz  3000Hz  4000Hz  6000Hz  8000Hz   Right ear:   Pass Pass Pass  Pass    Left ear:   Pass Pass Pass  Pass      Visual Acuity Screening   Right eye Left eye Both eyes  Without correction: 20/70 20/25 20/50   With correction:       Physical Examination:  VS reviewed GEN: Alert, Cooperative, Groomed, NAD Neck: bilateral points of tenderness in posterior triable of neck and it borders            Limited AROM neck with rotation and lateral bend bilaterally           Neck held in forward position with rounded shoulders  Rises from chair using 3-pt cane, slow gait, swing phase  does not bring heel past toe bilaterally  Mini-Mental State Examination or Montreal Cognitive Assessment:  Patient did  require additional cues or prompts to complete tasks. Patient was cooperative and attentive to testing tasks Patient did  appear motivated to  perform well    [facility] Cognitive Assessment  08/21/2020 10/19/2015  Visuospatial/ Executive (0/5) 5 4  Naming (0/3) 3 3  Attention: Read list of digits (0/2) 2 2  Attention: Read list of letters (0/1) 1 1  Attention: Serial 7 subtraction starting at 100 (0/3) 0 0  Language: Repeat phrase (0/2) 2 2  Language : Fluency (0/1) 0 0  Abstraction (0/2) 1 2  Delayed Recall (0/5) 0 0  Orientation (0/6) 6 6  Total 20 20  Adjusted Score (based on education) 21 21   Dr Shirley Martinique (FM-resident) documenteda MoCA score of 23/30  10/19/2017.    No flowsheet data found.      Montreal Cognitive Assessment  10/19/2015  Visuospatial/ Executive (0/5) 4  Naming (0/3) 3  Attention: Read list of digits (0/2) 2  Attention: Read list of letters (0/1) 1  Attention: Serial 7 subtraction starting at 100 (0/3) 0  Language: Repeat phrase (0/2) 2  Language : Fluency (0/1) 0  Abstraction (0/2) 2  Delayed Recall (0/5) 0  Orientation (0/6) 6  Total 20  Adjusted Score (based on education) 21     Labs No components found for: [facility]  Lab Results  Component Value Date   VITAMINB12 784 10/19/2017    No results found for: FOLATE  Lab Results  Component Value Date   TSH 4.420 02/04/2020    No results found for: RPR  No results found for: HIV    Chemistry      Component Value Date/Time   NA 141 03/31/2020 0933   K 3.8 03/31/2020 0933   CL 99 03/31/2020 0933   CO2 25 03/31/2020 0933   BUN 18 03/31/2020 0933   CREATININE 1.13 (H) 03/31/2020 0933   CREATININE 1.44 (H) 01/30/2019 0941      Component Value Date/Time   CALCIUM 10.1 03/31/2020 0933   ALKPHOS 132 (H) 02/04/2020 1722   AST 17 02/04/2020 1722   ALT 10 02/04/2020 1722   BILITOT <0.2 02/04/2020 1722        Lab Results  Component Value Date   HGBA1C 7.2 (A) 07/15/2020     @10RELATIVEDAYS @  Hearing Screening   125Hz  250Hz  500Hz  1000Hz  2000Hz  3000Hz  4000Hz  6000Hz  8000Hz   Right ear:   Pass Pass Pass  Pass    Left  ear:   Pass Pass Pass  Pass      Visual Acuity Screening   Right eye Left eye Both eyes  Without correction: 20/70 20/25 20/50   With correction:      Lab Results  Component Value Date   WBC 6.6 02/04/2020   HGB 13.3 02/04/2020   HCT 39.1 02/04/2020   MCV 89 02/04/2020   PLT 224 02/04/2020    No results found. However, due to the size of the patient record, not all encounters were searched. Please check Results Review for a complete set of results.  Imaging Head CT: 10/17/2017: Mild chronic small vessel ischemic disease.  Personal Strengths Active sense of humor Religious Affiliation Supportive family/friends  Support System Strengths Supportive Relationships and Spirituality   Advanced Directives Code Status: Full code Advance Directives: None  Contacts Informal [facility] POA per Mrs Mannie Stabile is granddaughter, Gerlene Fee, [phone].  Mrs Veronica Shaw gives verbal permission to speak  to Ms Luther Parody on her behalf.  ----------------------------------------------------------------------------------------------------------------------------------------  Assessment and Plan: Please see individual consultation notes from pharmacy and social work for today.    Problem List Items Addressed This Visit      High   Parkinson disease ([place])     Low   Spondyloarthropathy   Relevant Orders   Ambulatory referral to Physical Medicine Rehab    Other Visit Diagnoses    Mild Dementia without behavioral disturbance, unspecified dementia type ([place])   (Chronic)  -  Primary   Trigger point with neck pain       Relevant Orders   Ambulatory referral to Physical Medicine Rehab       Patient to Follow up with  Dr. Higinio Plan 4 week(s)  > 60 minutes face to face were spent in total with interdisciplinary discussion, patient

## 2020-08-21 NOTE — Assessment & Plan Note (Signed)
Established problem Evidence of progressive impairments of cognition and functions.  Mild stage of Dementia with resultant impaired abilities instrumental activities of daily living and preservation of basic activities of daily living. Patient with Parkinson's Disease that was diagnosed in 2014.  Dementia could be related to her Parkinson's disease and/or other chronic, progressive neurodegenerative Disorder.   We discussed the role of ChEi in therapy for dementias, including the minor decrease in rate of decline in cognition and function with adverse effects of nausea/diarrhea, urinary incontinence, and cardiac conduction changes.  She will discuss with her family whether or not to try trial of an ChEi.  She can contact the [facility] if she wishes to start or will discuss with Dr Higinio Plan at her next office visit.  Mrs Veronica Shaw was counseled about the diagnosis of dementia and progressive nature of the neurodegenerative disorders.    Recommend social services consultation for community resources including the [place], adults day care programs, caregiver support.   Recommended caregivers address advanced planning for patient's financial estate, future legal questions of competency and desired medical care the patient desires currently for resuscitation and for future care, including health care power of attorney agent, should patient become severely incapacitated.   Patient and caregiver(s) questions were invited and answered as best as possible.  > 60 minutes face to face where spent in total with counseling / coordination of care took more than 50% of the total time. Counseling involved discussion of the cognitive tests and function test results with patient. Discussion on diagnosis and natural history of dementia.

## 2020-08-21 NOTE — Assessment & Plan Note (Signed)
Established problem Encouraged Veronica Shaw to contact Dr Tat about the recommendation to start one tablet of Sinemet 50/200 at bedtime to address her cramps. Veronica Shaw had sent the prescription for the Sinement 50/200 back to her mail order pharmacy.  She thought she was suppose to take three of these 50/200 tablets at bedtime.

## 2020-08-25 ENCOUNTER — Ambulatory Visit: Payer: Medicare Other | Admitting: Adult Health

## 2020-08-27 ENCOUNTER — Ambulatory Visit (INDEPENDENT_AMBULATORY_CARE_PROVIDER_SITE_OTHER): Payer: Medicare Other

## 2020-08-27 DIAGNOSIS — I428 Other cardiomyopathies: Secondary | ICD-10-CM | POA: Diagnosis not present

## 2020-08-27 LAB — CUP PACEART REMOTE DEVICE CHECK
Battery Remaining Longevity: 30 mo
Battery Voltage: 2.95 V
Brady Statistic AP VP Percent: 0 %
Brady Statistic AP VS Percent: 0.02 %
Brady Statistic AS VP Percent: 0.03 %
Brady Statistic AS VS Percent: 99.95 %
Brady Statistic RA Percent Paced: 0.02 %
Brady Statistic RV Percent Paced: 0.03 %
Date Time Interrogation Session: 20220127033424
HighPow Impedance: 71 Ohm
Implantable Lead Implant Date: 20150427
Implantable Lead Implant Date: 20150427
Implantable Lead Location: 753859
Implantable Lead Location: 753860
Implantable Lead Model: 5076
Implantable Lead Model: 6935
Implantable Pulse Generator Implant Date: 20150427
Lead Channel Impedance Value: 399 Ohm
Lead Channel Impedance Value: 4047 Ohm
Lead Channel Impedance Value: 4047 Ohm
Lead Channel Impedance Value: 4047 Ohm
Lead Channel Impedance Value: 532 Ohm
Lead Channel Impedance Value: 589 Ohm
Lead Channel Pacing Threshold Amplitude: 0.5 V
Lead Channel Pacing Threshold Amplitude: 0.625 V
Lead Channel Pacing Threshold Pulse Width: 0.4 ms
Lead Channel Pacing Threshold Pulse Width: 0.4 ms
Lead Channel Sensing Intrinsic Amplitude: 11.5 mV
Lead Channel Sensing Intrinsic Amplitude: 11.5 mV
Lead Channel Sensing Intrinsic Amplitude: 2.375 mV
Lead Channel Sensing Intrinsic Amplitude: 2.375 mV
Lead Channel Setting Pacing Amplitude: 2 V
Lead Channel Setting Pacing Amplitude: 2.5 V
Lead Channel Setting Pacing Pulse Width: 0.4 ms
Lead Channel Setting Sensing Sensitivity: 0.3 mV

## 2020-09-07 ENCOUNTER — Ambulatory Visit (INDEPENDENT_AMBULATORY_CARE_PROVIDER_SITE_OTHER): Payer: Medicare Other

## 2020-09-07 ENCOUNTER — Ambulatory Visit: Payer: Medicare Other | Admitting: Adult Health

## 2020-09-07 DIAGNOSIS — Z95810 Presence of automatic (implantable) cardiac defibrillator: Secondary | ICD-10-CM

## 2020-09-07 DIAGNOSIS — I5022 Chronic systolic (congestive) heart failure: Secondary | ICD-10-CM

## 2020-09-07 NOTE — Progress Notes (Signed)
Remote ICD transmission.   

## 2020-09-11 NOTE — Progress Notes (Signed)
EPIC Encounter for ICM Monitoring  Patient Name: Veronica Shaw is a 70 y.o. female Date: 09/11/2020 Primary Care Physican: [name], DO Primary Cardiologist:Cooper/Weaver PA Electrophysiologist: Lovena Le 11/15/2021OfficeWeight: 135lbs  Spoke with patient and reports feeling well at this time.  Denies fluid symptoms.    OptiVolThoracic impedancenormal.  Prescribed:Furosemide40 mgTake 0.5 tablets (20 mg total) by moutheverydayas needed for fluid (For swelling).   Labs: 03/31/2020 Creatinine 1.13, BUN 18, Potassium 3.8, Sodium 141, GFR 49-57 02/04/2020 Creatinine1.33, BUN21, Potassium4.8, [id number], [id number] 11/01/2019 Creatinine1.13, BUN19, Potassium4.2, Sodium143, CRF54-36 A complete set of results can be found in Results Review.  Recommendations: No changes and encouraged to call if experiencing any fluid symptoms.  Follow-up plan: ICM clinic phone appointment on3/14/2022. 91 day device clinic remote transmission4/28/2022.   EP/Cardiology Office Visits:Recalls 4/2/2022with Dr. Burt Knack and 02/05/2021 for [name].   Copy of ICM check sent to Dr.Taylor   3 month ICM trend: 09/07/2020.    1 Year ICM trend:       [name], RN 09/11/2020 3:24 PM

## 2020-09-14 ENCOUNTER — Encounter: Payer: Self-pay | Admitting: Physical Medicine & Rehabilitation

## 2020-09-15 ENCOUNTER — Other Ambulatory Visit: Payer: Self-pay

## 2020-09-15 ENCOUNTER — Encounter: Payer: Self-pay | Admitting: Adult Health

## 2020-09-15 ENCOUNTER — Ambulatory Visit (INDEPENDENT_AMBULATORY_CARE_PROVIDER_SITE_OTHER): Payer: Medicare Other | Admitting: Adult Health

## 2020-09-15 DIAGNOSIS — J310 Chronic rhinitis: Secondary | ICD-10-CM | POA: Diagnosis not present

## 2020-09-15 DIAGNOSIS — J9611 Chronic respiratory failure with hypoxia: Secondary | ICD-10-CM | POA: Diagnosis not present

## 2020-09-15 DIAGNOSIS — J4530 Mild persistent asthma, uncomplicated: Secondary | ICD-10-CM | POA: Diagnosis not present

## 2020-09-15 NOTE — Progress Notes (Signed)
@Patient  ID: Veronica Shaw, female    DOB: 05/26/1951, 70 y.o.   MRN: [id number]  Chief Complaint  Patient presents with  . Follow-up  . Asthma    Referring provider: [name], DO  HPI: 70 year old female never smoker followed for moderate persistent asthma and upper airway cough syndrome Patient has multiple comorbidities including congestive heart failure, nonischemic cardiomyopathy status post pacemaker, osteoarthritis, Parkinson's disease, chronic kidney disease stage III, diabetes, GERD, depression  TEST/EVENTS :  Spirometry 01/05/15  FEV1  0.98 (46%) ratio 62  - - PFTs 02/17/15  FEV1  1.97 ( 93%) ratio 79 p 17% improvement from saba and dlco 107  - 08/19/2015  Walked RA x 3 laps @ 185 ft each stopped due to End of study, nl pace, no sob or desat   - NO 08/19/2015 = 79  - Spirometry 08/19/2015  FEV1 1.32 (65%)  Ratio 71 p am dulera 200 x one puff   2021Sleep study showed no significant sleep apnea. Her AHI was 2.9/hour. She did have some mild desaturations with an SaO2 low at 86%.  2021 ONO + desats  2D echo May 2017 EF 20 to 01%, marked systolic dyssynchrony, grade 1 diastolic dysfunction  2D echo June 2018 EF 30 to 35%, diffuse hypokinesis, grade 1 diastolic dysfunction  2D echo March 2019 EF 60 to 09%, grade 2 diastolic dysfunction.  09/15/2020 Follow up : Asthma, chronic respiratory failure on nocturnal oxygen Patient returns for a 71-month follow-up.  Patient says overall breathing is doing well.She denies any flare of cough or wheezing.  She remains on Symbicort.  She denies any increased albuterol use.  She remains on oxygen 2 L at bedtime. She is fully vaccinated including booster for COVID-19. Patient's activity level remains good, does light exercises at home .  Walks with cane most of the time. Uses walker on occasion.  Chest xray 06/2020 with stable changes, no acute process.  Remains on flonase . Has chronic nasal drip .   Has balance issues  due to Parkinson's . Lives with husband at home. Has some issues with neck pain due to muscle stiffness. Going to get treatments.    Allergies  Allergen Reactions  . Aspirin Swelling and Other (See Comments)    Other reaction(s): Other (See Comments) Causes nose bleeds *ONLY THE COATED ASA* Causes nose bleeds *ONLY THE COATED ASA*  . Penicillins Shortness Of Breath and Rash    Shortness of Breath - Throat felt like it was closing.   [name] [Conj Estrog-Medroxyprogest Ace] Shortness Of Breath    Throat swelling Throat swelling  . Ace Inhibitors Cough  . Simvastatin Other (See Comments) and Rash    Muscle aches    Immunization History  Administered Date(s) Administered  . Fluad Quad(high Dose 65+) 05/25/2020  . Influenza Split 06/17/2011, 04/18/2012  . Influenza Whole 05/17/2007, 06/14/2010  . Influenza,inj,Quad PF,6+ Mos 05/20/2014, 06/04/2015, 03/22/2016, 05/30/2017, 04/12/2018  . Influenza,inj,Quad PF,6-35 Mos 05/03/2013  . Influenza-Unspecified 04/01/2019  . PFIZER(Purple Top)SARS-COV-2 Vaccination 09/05/2019, 10/01/2019  . Pneumococcal Conjugate-13 05/20/2014  . Pneumococcal Polysaccharide-23 05/17/2007, 06/01/2013, 03/05/2019  . Td 03/01/2006  . Zoster 08/01/2009    Past Medical History:  Diagnosis Date  . Asthma   . Back pain 09/18/2019  . Cervical disc disorder with radiculopathy of cervical region 09/24/2018  . Chronic systolic CHF (congestive heart failure) ([place])    a. cMRI 4/15: EF 34% and findings - c/w NICM, normal RV size and function (RVEF 61%), Mild MR //  b. Echo 2/15:  EF 30-35%, diff HK, ant-sept AK, Gr 2 DD, mild MR, trivial TR  //  c. Echo 5/17: EF 20-25%, severe diffuse HK, marked systolic dyssynchrony, grade 1 diastolic dysfunction, mild MR  //  d. RHC 5/17: Fick CO 2.9, RVSP 19, PASP 15, PW mean 2, low filing pressures and preserved CO   . Cognitive impairment 10/19/2017   MOCA was administered with a score of 23/30  . Diabetes mellitus   . Diabetic  peripheral neuropathy ([place]) 12/09/2011  . Flu 10/17/2017  . Gastritis   . History of echocardiogram    Echo 6/18: EF 30-35, diffuse HK, grade 1 diastolic dysfunction, trivial MR, mild LAE, mild TR, no pericardial effusion  . History of nuclear stress test    Myoview 5/18: EF 49, no ischemia, inferoseptal defect c/w LBBB artifact (intermediate risk due to EF < 50).  [name] Kitchen HTN (hypertension)   . Hyperlipidemia   . Hyperlipidemia associated with type 2 diabetes mellitus ([place]) 03/05/2019  . Hypertension associated with diabetes ([place]) 09/28/2006   Qualifier: Diagnosis of  By: [name]    . Major depression 03/31/2013  . Melena 08/21/2020  . NICM (nonischemic cardiomyopathy) ([place])    a. Nuclear 5/13: Normal stress nuclear study. LV Ejection Fraction: 58%  //  b. LHC 10/14: Minor luminal irregularity in prox LAD, EF 35%   . Parkinson disease ([place])   . Plantar fasciitis   . Plantar fasciitis, bilateral 06/15/2012  . Rosacea 05/29/2009   Qualifier: Diagnosis of  By: [name] MD, Rodman Key    . Sensorineural hearing loss (SNHL), bilateral 01/04/2017  . Sleep apnea    was retested and no longer had it and so d/c CPAP  . Syncope   . Thoracic or lumbosacral neuritis or radiculitis, unspecified 07/03/2013  . Tinnitus, bilateral 01/04/2017  . Type II diabetes mellitus with complication ([place]) 2/67/1245   Diabetic eye exam done by [facility] Ophthalmology: Dr. Prudencio Burly on 11/08/13: no diabetic retinopathy. Repeat in 1 year    . Urticaria     Tobacco History: Social History   Tobacco Use  Smoking Status Never Smoker  Smokeless Tobacco Never Used  Tobacco Comment   exposed to smoke during child hood (parents)   Counseling given: Not Answered Comment: exposed to smoke during child hood (parents)   Outpatient Medications Prior to Visit  Medication Sig Dispense Refill  . albuterol (PROVENTIL HFA;VENTOLIN HFA) 108 (90 Base) MCG/ACT inhaler Inhale 2 puffs into the lungs every 4 (four) hours as needed for  wheezing or shortness of breath. 18 g 3  . albuterol (PROVENTIL) (2.5 MG/3ML) 0.083% nebulizer solution     . APPLE CIDER VINEGAR PO Take 1 Piece by mouth daily.    . Biotin 5000 MCG CAPS Take 1 tablet by mouth daily.     . budesonide-formoterol (SYMBICORT) 160-4.5 MCG/ACT inhaler Inhale 2 puffs into the lungs 2 (two) times daily. 1 Inhaler 0  . carbidopa-levodopa (SINEMET CR) 50-200 MG tablet Take 1 tablet by mouth at bedtime. 90 tablet 1  . carbidopa-levodopa (SINEMET IR) 25-100 MG tablet TAKE 1 TABLET BY MOUTH THREE TIMES DAILY 270 tablet 0  . EPINEPHrine 0.3 mg/0.3 mL IJ SOAJ injection     . famotidine (PEPCID) 20 MG tablet TAKE 1 TABLET TWICE DAILY 180 tablet 2  . fluticasone (FLONASE SENSIMIST) 27.5 MCG/SPRAY nasal spray Place 2 sprays into the nose daily. 10 g 2  . furosemide (LASIX) 40 MG tablet TAKE 1/2 TABLET EVERY DAY AS NEEDED FOR  FLUID/SWELLING 30 tablet 3  . levalbuterol (XOPENEX) 0.63 MG/3ML nebulizer solution 1 vial every 4-6 hours as needed for wheezing and shortness of breath. 180 mL 5  . losartan (COZAAR) 25 MG tablet TAKE 1 TABLET EVERY DAY 90 tablet 3  . meclizine (ANTIVERT) 12.5 MG tablet Take 0.5 tablets (6.25 mg total) by mouth 3 (three) times daily as needed for dizziness. 15 tablet 0  . Melatonin 10 MG TABS Take by mouth as needed.    . metFORMIN (GLUCOPHAGE) 500 MG tablet Take 2 tablets (1,000 mg total) by mouth 2 (two) times daily with a meal. 180 tablet 3  . metoprolol succinate (TOPROL-XL) 50 MG 24 hr tablet TAKE 1 TABLET TWICE DAILY 180 tablet 2  . OXYGEN Inhale into the lungs at bedtime. Inhale 2L into lungs    . potassium chloride (KLOR-CON) 10 MEQ tablet 10 mEq 1 tablet    . spironolactone (ALDACTONE) 25 MG tablet TAKE 1/2 TABLET EVERY DAY 45 tablet 3  . Turmeric 400 MG CAPS Take 1 tablet by mouth daily.    [name] Kitchen Ubiquinone (ULTRA COQ10 PO) Take 1 tablet by mouth daily.     No facility-administered medications prior to visit.     Review of Systems:    Constitutional:   No  weight loss, night sweats,  Fevers, chills, fatigue, or  lassitude.  HEENT:   No headaches,  Difficulty swallowing,  Tooth/dental problems, or  Sore throat,                No sneezing, itching, ear ache, + nasal congestion, post nasal drip,   CV:  No chest pain,  Orthopnea, PND, swelling in lower extremities, anasarca, dizziness, palpitations, syncope.   GI  No heartburn, indigestion, abdominal pain, nausea, vomiting, diarrhea, change in bowel habits, loss of appetite, bloody stools.   Resp:  .  No chest wall deformity  Skin: no rash or lesions.  GU: no dysuria, change in color of urine, no urgency or frequency.  No flank pain, no hematuria   MS:  No joint pain or swelling.  No decreased range of motion.  No back pain.    Physical Exam  BP (!) 108/58 (BP Location: Left Arm, Patient Position: Sitting, Cuff Size: Normal)   Pulse 92   Temp (!) 97.2 F (36.2 C) (Temporal)   Ht 5' 1.5" (1.562 m)   Wt 134 lb 12.8 oz (61.1 kg)   LMP 08/01/2000 (Approximate)   SpO2 97%   BMI 25.06 kg/m   GEN: A/Ox3; pleasant , NAD, walks with a cane   HEENT:  [place]/AT,    NOSE-clear, THROAT-clear, no lesions, no postnasal drip or exudate noted.   NECK:  Supple w/ fair ROM; no JVD; normal carotid impulses w/o bruits; no thyromegaly or nodules palpated; no lymphadenopathy.    RESP  Clear  P & A; w/o, wheezes/ rales/ or rhonchi. no accessory muscle use, no dullness to percussion  CARD:  RRR, no m/r/g, no peripheral edema, pulses intact, no cyanosis or clubbing.  GI:   Soft & nt; nml bowel sounds; no organomegaly or masses detected.   Musco: Warm bil, no deformities or joint swelling noted.   Neuro: alert, no focal deficits noted.    Skin: Warm, no lesions or rashes    Lab Results:     Imaging: CUP PACEART REMOTE DEVICE CHECK  Result Date: 08/27/2020 Scheduled remote reviewed. Normal device function.  Next remote 91 days. Kathy Breach, RN, CCDS, CV Remote  Solutions  PFT Results Latest Ref Rng & Units 02/17/2015  FVC-Pre L 2.23  FVC-Predicted Pre % 80  FVC-Post L 2.49  FVC-Predicted Post % 89  Pre FEV1/FVC % % 75  Post FEV1/FCV % % 79  FEV1-Pre L 1.67  FEV1-Predicted Pre % 79  FEV1-Post L 1.97  DLCO uncorrected ml/min/mmHg 21.77  DLCO UNC% % 107  DLVA Predicted % 117  TLC L 4.32  TLC % Predicted % 93  RV % Predicted % 70    Lab Results  Component Value Date   NITRICOXIDE 82 08/27/2018        Assessment & Plan:   No problem-specific Assessment & Plan notes found for this encounter.     Rexene Edison, NP 09/15/2020

## 2020-09-15 NOTE — Patient Instructions (Addendum)
Continue on Oxygen 2l/m At bedtime.  Saline nasal spray Twice daily  As needed   Saline nasal Gel At bedtime  .  Flonase daily .  Continue Symbicort 2 puffs Twice daily  , rinse after use.  Use Albuterol  As needed for wheezing and shortness of breath  Activity as tolerated.  Follow up in  3-4  months with Dr. Vaughan Browner or Arelene Moroni NP and As needed   Please contact office for sooner follow up if symptoms do not improve or worsen or seek emergency care

## 2020-09-15 NOTE — Assessment & Plan Note (Signed)
Asthma appears to be well controlled.  Patient is continue on her current regimen  Plan  Patient Instructions  Continue on Oxygen 2l/m At bedtime.  Saline nasal spray Twice daily  As needed   Saline nasal Gel At bedtime  .  Flonase daily .  Continue Symbicort 2 puffs Twice daily  , rinse after use.  Use Albuterol  As needed for wheezing and shortness of breath  Activity as tolerated.  Follow up in  3-4  months with Dr. Vaughan Browner or Aldean Pipe NP and As needed   Please contact office for sooner follow up if symptoms do not improve or worsen or seek emergency care

## 2020-09-15 NOTE — Assessment & Plan Note (Signed)
Continue on Flonase and saline nasal spray.

## 2020-09-15 NOTE — Assessment & Plan Note (Signed)
Doing well nocturnal oxygen.  No changes.

## 2020-09-23 DIAGNOSIS — H10413 Chronic giant papillary conjunctivitis, bilateral: Secondary | ICD-10-CM | POA: Diagnosis not present

## 2020-09-23 DIAGNOSIS — H00012 Hordeolum externum right lower eyelid: Secondary | ICD-10-CM | POA: Diagnosis not present

## 2020-09-23 DIAGNOSIS — H26491 Other secondary cataract, right eye: Secondary | ICD-10-CM | POA: Diagnosis not present

## 2020-09-28 ENCOUNTER — Other Ambulatory Visit: Payer: Self-pay | Admitting: Neurology

## 2020-09-28 DIAGNOSIS — H26491 Other secondary cataract, right eye: Secondary | ICD-10-CM | POA: Diagnosis not present

## 2020-09-28 DIAGNOSIS — Z961 Presence of intraocular lens: Secondary | ICD-10-CM | POA: Diagnosis not present

## 2020-10-02 ENCOUNTER — Other Ambulatory Visit: Payer: Self-pay | Admitting: Family Medicine

## 2020-10-07 ENCOUNTER — Encounter: Payer: Self-pay | Admitting: Physical Medicine and Rehabilitation

## 2020-10-07 ENCOUNTER — Other Ambulatory Visit: Payer: Self-pay

## 2020-10-07 ENCOUNTER — Encounter
Payer: Medicare Other | Attending: Physical Medicine and Rehabilitation | Admitting: Physical Medicine and Rehabilitation

## 2020-10-07 VITALS — Ht 61.5 in | Wt 137.0 lb

## 2020-10-07 DIAGNOSIS — G243 Spasmodic torticollis: Secondary | ICD-10-CM | POA: Diagnosis not present

## 2020-10-07 DIAGNOSIS — M542 Cervicalgia: Secondary | ICD-10-CM | POA: Insufficient documentation

## 2020-10-07 NOTE — Patient Instructions (Addendum)
Pt is a 70 yr old female with hx of NICM, sCHF, DM- !c 7.2, Parkinsons' disease, CKD, and asthma and DJD/arthritis here for neck pain.  Of note, not on any blood thinners. 1. Cervical dystonia with myofascial pain.   Patient here for trigger point injections for  Consent done and on chart.  Cleaned areas with alcohol and injected using a 27 gauge 1.5 inch needle  Injected 3cc Using 1% Lidocaine with no EPI  Upper traps B/L Levators B/L Posterior scalenes B/L x2 Middle scalenes Splenius Capitus Pectoralis Major R only- due to pacemaker on L  Rhomboids b/L Infraspinatus Teres Major/minor Thoracic paraspinals Lumbar paraspinals Other injections-    Patient's level of pain prior was 8/10 Current level of pain after injections is 1 minute later- now 4-5/10- ROM is also better- 15 degrees more on R rotation and 20 degrees on L Chin can go all the way to chest.  Also, forward chin thrust is gone.   There was no bleeding or complications.  Patient was advised to drink a lot of water on day after injections to flush system Will have increased soreness for 12-48 hours after injections.  Can use Lidocaine patches the day AFTER injections Can use theracane on day of injections in places didn't inject Can use heating pad 4-6 hours AFTER injections  2. Suggest Sutera pillow- usually $50-60 online.   3. If don't want that one pillow, suggest at a memory foam that has  Cut out for the neck.   4. If trigger points don't last long enough- might need botox for cervical dystonia.   5. Theracane-  Usually $25-30 online- don't get another brand please- hold pressure on tight muscles 2-4 minutes- don't massage. And muscle tightens, then relaxes in the first 2 minutes or so- the bigger the muscle, the time it takes to relax.  I would use 2x/week- Can look at medical supply store to find.  Youtube has great videos to see how to use theracane.   6. Magnesium  400 mg 1-2x/day.  Over the counter-  Not a prescription. Is a good muscle relaxant.  At higher doses, used to stop preterm labor.  Can cause loose stools-   7.  Will f/u in 6 weeks.  Will do trigger point injections if needed.

## 2020-10-07 NOTE — Progress Notes (Deleted)
Assessment/Plan:   1.  Parkinsons Disease  -***compliance has been an issue.  Take carbidopa/levodopa 25/100 at 7am/11am/4pm  -take carbidopa/levodopa 50/200 CR at bed 2.  Memory change  -last neurcog testing via telemed in [place] in 2020 demonstrating anxiety/depression.  -we tried to repeat and she had been agreeable and refused when tried to schedule.  She did go to [facility] geriatric clinic and had MoCA blind (not a very good test) and MMSE and told she had mild dementia.  I would like to see more formal neurocog testing done.   Subjective:   Veronica Shaw was seen today in follow up for Parkinsons disease.  My previous records were reviewed prior to todays visit as well as outside records available to me. Pt denies falls.  Pt denies lightheadedness, near syncope.  No hallucinations.  Last visit, we decided to repeat her neurocog testing and then she ultimately declined to schedule it.  However, her PCP referred her to the geriatric clinic for memory change.  she did end up going to [facility] geriatrics clinic.  They did a MoCA blind and MMSE and was advised she had dementia.  Current prescribed movement disorder medications: ***carbidopa/levodopa 25/100 tid (but pt often taking bid) Carbidopa/levodopa 50/200 q hs (started last visit for cramping)    PREVIOUS MEDICATIONS: mirtazapine given but not taken  ALLERGIES:   Allergies  Allergen Reactions  . Aspirin Swelling and Other (See Comments)    Other reaction(s): Other (See Comments) Causes nose bleeds *ONLY THE COATED ASA* Causes nose bleeds *ONLY THE COATED ASA*  . Penicillins Shortness Of Breath and Rash    Shortness of Breath - Throat felt like it was closing.   [name] [Conj Estrog-Medroxyprogest Ace] Shortness Of Breath    Throat swelling Throat swelling  . Ace Inhibitors Cough  . Simvastatin Other (See Comments) and Rash    Muscle aches    CURRENT MEDICATIONS:  Outpatient Encounter Medications as of  10/09/2020  Medication Sig  . albuterol (PROVENTIL HFA;VENTOLIN HFA) 108 (90 Base) MCG/ACT inhaler Inhale 2 puffs into the lungs every 4 (four) hours as needed for wheezing or shortness of breath.  [name] Kitchen albuterol (PROVENTIL) (2.5 MG/3ML) 0.083% nebulizer solution   . APPLE CIDER VINEGAR PO Take 1 Piece by mouth daily.  . Biotin 5000 MCG CAPS Take 1 tablet by mouth daily.   . budesonide-formoterol (SYMBICORT) 160-4.5 MCG/ACT inhaler Inhale 2 puffs into the lungs 2 (two) times daily.  . carbidopa-levodopa (SINEMET CR) 50-200 MG tablet Take 1 tablet by mouth at bedtime.  . carbidopa-levodopa (SINEMET IR) 25-100 MG tablet TAKE 1 TABLET BY MOUTH THREE TIMES DAILY  . EPINEPHrine 0.3 mg/0.3 mL IJ SOAJ injection   . famotidine (PEPCID) 20 MG tablet TAKE 1 TABLET TWICE DAILY  . fluticasone (FLONASE SENSIMIST) 27.5 MCG/SPRAY nasal spray Place 2 sprays into the nose daily.  . furosemide (LASIX) 40 MG tablet TAKE 1/2 TABLET EVERY DAY AS NEEDED FOR  FLUID/SWELLING  . levalbuterol (XOPENEX) 0.63 MG/3ML nebulizer solution 1 vial every 4-6 hours as needed for wheezing and shortness of breath.  . losartan (COZAAR) 25 MG tablet TAKE 1 TABLET EVERY DAY  . meclizine (ANTIVERT) 12.5 MG tablet Take 1 tablet (12.5 mg total) by mouth daily as needed for dizziness. Should not be long term.  . Melatonin 10 MG TABS Take by mouth as needed.  . metFORMIN (GLUCOPHAGE) 500 MG tablet Take 2 tablets (1,000 mg total) by mouth 2 (two) times daily with a meal.  .  metoprolol succinate (TOPROL-XL) 50 MG 24 hr tablet TAKE 1 TABLET TWICE DAILY  . OXYGEN Inhale into the lungs at bedtime. Inhale 2L into lungs  . potassium chloride (KLOR-CON) 10 MEQ tablet 10 mEq 1 tablet  . spironolactone (ALDACTONE) 25 MG tablet TAKE 1/2 TABLET EVERY DAY  . Turmeric 400 MG CAPS Take 1 tablet by mouth daily.  [name] Kitchen Ubiquinone (ULTRA COQ10 PO) Take 1 tablet by mouth daily.   No facility-administered encounter medications on file as of 10/09/2020.     Objective:   PHYSICAL EXAMINATION:    VITALS:  There were no vitals filed for this visit.  GEN:  The patient appears stated age and is in NAD. HEENT:  Normocephalic, atraumatic.  The mucous membranes are moist. The superficial temporal arteries are without ropiness or tenderness. CV:  RRR Lungs:  CTAB Neck/HEME:  There are no carotid bruits bilaterally.  Neurological examination:  Orientation: The patient is alert and oriented x3. Cranial nerves: There is good facial symmetry with*** facial hypomimia. The speech is fluent and clear. Soft palate rises symmetrically and there is no tongue deviation. Hearing is intact to conversational tone. Sensation: Sensation is intact to light touch throughout Motor: Strength is at least antigravity x4.  Movement examination: Tone: There is ***tone in the *** Abnormal movements: *** Coordination:  There is *** decremation with RAM's, *** Gait and Station: The patient has *** difficulty arising out of a deep-seated chair without the use of the hands. The patient's stride length is ***.  The patient has a *** pull test.     I have reviewed and interpreted the following labs independently    Chemistry      Component Value Date/Time   NA 141 03/31/2020 0933   K 3.8 03/31/2020 0933   CL 99 03/31/2020 0933   CO2 25 03/31/2020 0933   BUN 18 03/31/2020 0933   CREATININE 1.13 (H) 03/31/2020 0933   CREATININE 1.44 (H) 01/30/2019 0941      Component Value Date/Time   CALCIUM 10.1 03/31/2020 0933   ALKPHOS 132 (H) 02/04/2020 1722   AST 17 02/04/2020 1722   ALT 10 02/04/2020 1722   BILITOT <0.2 02/04/2020 1722       Lab Results  Component Value Date   WBC 6.6 02/04/2020   HGB 13.3 02/04/2020   HCT 39.1 02/04/2020   MCV 89 02/04/2020   PLT 224 02/04/2020    Lab Results  Component Value Date   TSH 4.420 02/04/2020     Total time spent on today's visit was ***30 minutes, including both face-to-face time and nonface-to-face time.   Time included that spent on review of records (prior notes available to me/labs/imaging if pertinent), discussing treatment and goals, answering patient's questions and coordinating care.  Cc:  [name], DO

## 2020-10-07 NOTE — Progress Notes (Signed)
Subjective:    Patient ID: Veronica Shaw, female    DOB: [birth year], 70 y.o.   MRN: [id number]  HPI Pt is a 70 yr old female with hx of NICM, sCHF, DM- !c 7.2, Parkinsons' disease, CKD, and asthma and DJD/arthritis here for neck pain.  Of note, not on any blood thinners.   Has had for 8 years.  Woke up one morning and chin was stuck at chest and got trigger point injections with improvement.  Lasted a "decent amount of time".    Chronic constant pain- and tightness.  Has head turned to 1 side- can't Put head straight".   6 months ago in Operating room- 3 shots- they were horrible.  Think she got worse with these injections than was before.   Constant R temple/ear HA.  Has frequent to constant vertigo.  Feels will fall when turns to the side slightly, will fall.   Has fallen a lot lately- every 2 weeks to 1 week.  Misjudged the distance for things.  Hit head on cabinet-  No LOC- just dizziness.    Has had sciatica on RLE- not as bad as used to,  Still has a nerve that's affected-  Has N/T- R>>>L Mainly feet and occ legs Can occur up legs ot knee, but not less  Dx'd with DM 15 yrs ago.   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    Social Hx: Not driving anymore- husband drives.  Use 4 pronged cane- 4 pronged tip Was told should use Rollator or RW- and would have less accidents.  Uses "regular pillow"- foam pillow- and neck "C" pillow. Thinks it makes it worse  Pain Inventory Average Pain 7 Pain Right Now 5 My pain is sharp and tingling  In the last 24 hours, has pain interfered with the following? General activity n/a Relation with others n/a Enjoyment of life n/a What TIME of day is your pain at its worst? varies Sleep (in general) NA  Pain is worse with: inactivity and some activites Pain improves with: n/a Relief from Meds: no pain meds  walk with assistance use a cane do you drive?   no  retired  bladder control problems bowel control problems tremor tingling trouble walking dizziness  new  new    Family History  Problem Relation Age of Onset  . Coronary artery disease Father        Died age 11  . Heart attack Father   . Diabetes Father   . Coronary artery disease Mother        Died age 24  . Heart attack Mother   . Diabetes Mother   . Parkinson's disease Sister   . Heart disease Sister   . Hepatitis C Sister   . Diabetes Sister   . Diabetes Son 37       T1DM  . Healthy Daughter   . Diabetes Sister   . Heart disease Sister   . Diabetes Sister   . Heart disease Sister   . Breast cancer Neg Hx    Social History   Socioeconomic History  . Marital status: Married    Spouse name: [name]   . Number of children: 2  . Years of education: 23  . Highest education level: 12th grade  Occupational History  . Occupation: retired    Comment: CNA  Tobacco Use  . Smoking status: Never Smoker  . Smokeless tobacco: Never Used  . Tobacco comment:  exposed to smoke during child hood (parents)  Vaping Use  . Vaping Use: Never used  Substance and Sexual Activity  . Alcohol use: No    Alcohol/week: 0.0 standard drinks  . Drug use: No  . Sexual activity: Yes    Birth control/protection: Post-menopausal  Other Topics Concern  . Not on file  Social History Narrative   Patient lives with her husband [name].   Patient enjoys spending time with her family, cooking, and her 2 dogs.    Patient has one daughter who lives locally, and one son who lives in [place].   Social Determinants of Health   Financial Resource Strain: Low Risk   . Difficulty of Paying Living Expenses: Not hard at all  Food Insecurity: No Food Insecurity  . Worried About Charity fundraiser in the Last Year: Never true  . Ran Out of Food in the Last Year: Never true  Transportation Needs: No Transportation Needs  . Lack of Transportation (Medical): No  . Lack of Transportation  (Non-Medical): No  Physical Activity: Sufficiently Active  . Days of Exercise per Week: 7 days  . Minutes of Exercise per Session: 30 min  Stress: No Stress Concern Present  . Feeling of Stress : Not at all  Social Connections: Moderately Integrated  . Frequency of Communication with Friends and Family: More than three times a week  . Frequency of Social Gatherings with Friends and Family: More than three times a week  . Attends Religious Services: More than 4 times per year  . Active Member of Clubs or Organizations: No  . Attends Archivist Meetings: Never  . Marital Status: Married   Past Surgical History:  Procedure Laterality Date  . BREAST EXCISIONAL BIOPSY Left 1970   benign cyst  . BREAST SURGERY Left   . BUNIONECTOMY    . CARDIAC CATHETERIZATION N/A 12/16/2015   Procedure: Right Heart Cath;  Surgeon: Sherren Mocha, MD;  Location: [facility];  Service: Cardiovascular;  Laterality: N/A;  . CHOLECYSTECTOMY    . eye lid surgery Bilateral 05/09/2019  . IMPLANTABLE CARDIOVERTER DEFIBRILLATOR IMPLANT  11-25-13   MDT dual chamber ICD implanted by [name] for primary prevention  . IMPLANTABLE CARDIOVERTER DEFIBRILLATOR IMPLANT N/A 11/25/2013   Procedure: IMPLANTABLE CARDIOVERTER DEFIBRILLATOR IMPLANT;  Surgeon: [name], MD;  Location: [facility] CATH LAB;  Service: Cardiovascular;  Laterality: N/A;  . LEFT AND RIGHT HEART CATHETERIZATION WITH CORONARY ANGIOGRAM N/A 05/31/2013   Procedure: LEFT AND RIGHT HEART CATHETERIZATION WITH CORONARY ANGIOGRAM;  Surgeon: [name], MD;  Location: [facility] CATH LAB;  Service: Cardiovascular;  Laterality: N/A;  . TONSILLECTOMY    . TUBAL LIGATION     Past Medical History:  Diagnosis Date  . Asthma   . Back pain 09/18/2019  . Cervical disc disorder with radiculopathy of cervical region 09/24/2018  . Chronic systolic CHF (congestive heart failure) ([place])    a. cMRI 4/15: EF 34% and findings - c/w NICM, normal RV size and  function (RVEF 61%), Mild MR // b. Echo 2/15:  EF 30-35%, diff HK, ant-sept AK, Gr 2 DD, mild MR, trivial TR  //  c. Echo 5/17: EF 20-25%, severe diffuse HK, marked systolic dyssynchrony, grade 1 diastolic dysfunction, mild MR  //  d. RHC 5/17: Fick CO 2.9, RVSP 19, PASP 15, PW mean 2, low filing pressures and preserved CO   . Cognitive impairment 10/19/2017   MOCA was administered with a score of 23/30  .  Diabetes mellitus   . Diabetic peripheral neuropathy ([place]) 12/09/2011  . Flu 10/17/2017  . Gastritis   . History of echocardiogram    Echo 6/18: EF 30-35, diffuse HK, grade 1 diastolic dysfunction, trivial MR, mild LAE, mild TR, no pericardial effusion  . History of nuclear stress test    Myoview 5/18: EF 49, no ischemia, inferoseptal defect c/w LBBB artifact (intermediate risk due to EF < 50).  [name] Kitchen HTN (hypertension)   . Hyperlipidemia   . Hyperlipidemia associated with type 2 diabetes mellitus ([place]) 03/05/2019  . Hypertension associated with diabetes ([place]) 09/28/2006   Qualifier: Diagnosis of  By: [name]    . Major depression 03/31/2013  . Melena 08/21/2020  . NICM (nonischemic cardiomyopathy) ([place])    a. Nuclear 5/13: Normal stress nuclear study. LV Ejection Fraction: 58%  //  b. LHC 10/14: Minor luminal irregularity in prox LAD, EF 35%   . Parkinson disease ([place])   . Plantar fasciitis   . Plantar fasciitis, bilateral 06/15/2012  . Rosacea 05/29/2009   Qualifier: Diagnosis of  By: [name] MD, Rodman Key    . Sensorineural hearing loss (SNHL), bilateral 01/04/2017  . Sleep apnea    was retested and no longer had it and so d/c CPAP  . Syncope   . Thoracic or lumbosacral neuritis or radiculitis, unspecified 07/03/2013  . Tinnitus, bilateral 01/04/2017  . Type II diabetes mellitus with complication ([place]) 1/54/0086   Diabetic eye exam done by [facility]: Dr. Prudencio Burly on 11/08/13: no diabetic retinopathy. Repeat in 1 year    . Urticaria    Ht 5' 1.5" (1.562 m)   Wt 137 lb (62.1  kg)   LMP 08/01/2000 (Approximate)   BMI 25.47 kg/m   Opioid Risk Score:   Fall Risk Score:  `1  Depression screen PHQ 2/9  Depression screen [facility] 2/9 10/07/2020 08/20/2020 07/01/2020 07/01/2020 03/31/2020 09/18/2019 09/17/2019  Decreased Interest 1 0 0 0 0 0 0  Down, Depressed, Hopeless 0 0 0 0 0 0 0  PHQ - 2 Score 1 0 0 0 0 0 0  Altered sleeping 3 0 - - 1 - -  Tired, decreased energy 2 0 - - 1 - -  Change in appetite 1 0 - - 0 - -  Feeling bad or failure about yourself  0 0 - - 0 - -  Trouble concentrating 1 0 - - 1 - -  Moving slowly or fidgety/restless 1 0 - - 0 - -  Suicidal thoughts 0 0 - - 0 - -  PHQ-9 Score 9 0 - - 3 - -  Difficult doing work/chores Somewhat difficult - - - Not difficult at all - -  Some recent data might be hidden    Review of Systems  Constitutional: Negative.   HENT: Negative.   Eyes: Negative.   Respiratory: Positive for shortness of breath.   Cardiovascular: Negative.   Gastrointestinal: Negative.   Endocrine: Negative.        High blood sugar  Genitourinary: Negative.   Musculoskeletal: Positive for myalgias, neck pain and neck stiffness.  Skin: Negative.   Allergic/Immunologic: Negative.   Neurological: Positive for dizziness, tremors and headaches.       Tingling   Hematological: Negative.   Psychiatric/Behavioral: Negative.   All other systems reviewed and are negative.      Objective:   Physical Exam Awake, alert, appropriate, wearing hat and sunglasses- light sensitive, NAD Mild cervical dystonia  Chin slightly rotated to R and top  of head to Left Also has cervical  Chin thrust.  Very tight trigger points in scalenes, splenius capitus, upper traps, rhomboids, and pecs- esp post auricular R=L- no SCM tightnes son R and mild on L.  Has very limited ROM of cervical spine- ~15-20 degrees max of cervical rotation and flexion- no significant extension    Assessment & Plan:   Pt is a 70 yr old female with hx of NICM, sCHF, DM- !c 7.2,  Parkinsons' disease, CKD, and asthma and DJD/arthritis here for neck pain.  Of note, not on any blood thinners. 1. Cervical dystonia with myofascial pain.   Patient here for trigger point injections for  Consent done and on chart.  Cleaned areas with alcohol and injected using a 27 gauge 1.5 inch needle  Injected 3cc Using 1% Lidocaine with no EPI  Upper traps B/L Levators B/L Posterior scalenes B/L x2 Middle scalenes Splenius Capitus Pectoralis Major R only- due to pacemaker on L  Rhomboids b/L Infraspinatus Teres Major/minor Thoracic paraspinals Lumbar paraspinals Other injections-    Patient's level of pain prior was 8/10 Current level of pain after injections is 1 minute later- now 4-5/10- ROM is also better- 15 degrees more on R rotation and 20 degrees on L Chin can go all the way to chest.  Also, forward chin thrust is gone.   There was no bleeding or complications.  Patient was advised to drink a lot of water on day after injections to flush system Will have increased soreness for 12-48 hours after injections.  Can use Lidocaine patches the day AFTER injections Can use theracane on day of injections in places didn't inject Can use heating pad 4-6 hours AFTER injections  2. Suggest Sutera pillow- usually $50-60 online.   3. If don't want that one pillow, suggest at a memory foam that has  Cut out for the neck.   4. If trigger points don't last long enough- might need botox for cervical dystonia.   5. Theracane-  Usually $25-30 online- don't get another brand please- hold pressure on tight muscles 2-4 minutes- don't massage. And muscle tightens, then relaxes in the first 2 minutes or so- the bigger the muscle, the time it takes to relax.  I would use 2x/week- Can look at medical supply store to find.   6. Magnesium  400 mg 1-2x/day.  Over the counter- Not a prescription. Is a good muscle relaxant.  At higher doses, used to stop preterm labor. Can cause loose  stools  7.  Will f/u in 6 weeks.  Will do trigger point injections if needed.    I spent a total of 45 minutes on visit- as detailed above.

## 2020-10-09 ENCOUNTER — Ambulatory Visit: Payer: Medicare Other | Admitting: Neurology

## 2020-10-09 ENCOUNTER — Other Ambulatory Visit: Payer: Self-pay | Admitting: Adult Health

## 2020-10-09 DIAGNOSIS — J4530 Mild persistent asthma, uncomplicated: Secondary | ICD-10-CM

## 2020-10-12 ENCOUNTER — Ambulatory Visit (INDEPENDENT_AMBULATORY_CARE_PROVIDER_SITE_OTHER): Payer: Medicare Other

## 2020-10-12 DIAGNOSIS — Z95810 Presence of automatic (implantable) cardiac defibrillator: Secondary | ICD-10-CM

## 2020-10-12 DIAGNOSIS — I5022 Chronic systolic (congestive) heart failure: Secondary | ICD-10-CM | POA: Diagnosis not present

## 2020-10-14 ENCOUNTER — Other Ambulatory Visit: Payer: Self-pay

## 2020-10-14 DIAGNOSIS — J4530 Mild persistent asthma, uncomplicated: Secondary | ICD-10-CM

## 2020-10-14 MED ORDER — LEVALBUTEROL HCL 0.63 MG/3ML IN NEBU: INHALATION_SOLUTION | RESPIRATORY_TRACT | 11 refills | Status: DC

## 2020-10-15 DIAGNOSIS — H26491 Other secondary cataract, right eye: Secondary | ICD-10-CM | POA: Diagnosis not present

## 2020-10-16 NOTE — Progress Notes (Signed)
EPIC Encounter for ICM Monitoring  Patient Name: Uchechi [place] is a 70 y.o. female Date: 10/16/2020 Primary Care Physican: [name], DO Primary Cardiologist:Cooper/Weaver PA Electrophysiologist: Lovena Le 3/9/2022OfficeWeight: 137lbs  Spoke with patient and reports she had eye procedure yesterday but has not felt well.  Denies fluid symptoms.    OptiVolThoracic impedancenormal.  Prescribed:Furosemide40 mgTake 0.5 tablets (20 mg total) by moutheverydayas needed for fluid (For swelling).   Labs: 03/31/2020 Creatinine 1.13, BUN 18, Potassium 3.8, Sodium 141, GFR 49-57 02/04/2020 Creatinine1.33, BUN21, Potassium4.8, [id number], [id number] 11/01/2019 Creatinine1.13, BUN19, Potassium4.2, Sodium143, MOQ94-76 A complete set of results can be found in Results Review.  Recommendations: Advised to call eye surgeon's office today to provide an update and she agreed.   No changes and encouraged to call if experiencing any fluid symptoms.  Follow-up plan: ICM clinic phone appointment on4/19/2022. 91 day device clinic remote transmission4/28/2022.   EP/Cardiology Office Visits: 12/02/2020 with Richardson Dopp, PA.  Recalls 4/2/2022with Dr. Burt Knack and 02/05/2021 for [name].   Copy of ICM check sent to Dr.Taylor   3 month ICM trend: 10/12/2020.    1 Year ICM trend:       [name], RN 10/16/2020 8:44 AM

## 2020-10-17 ENCOUNTER — Encounter (HOSPITAL_COMMUNITY): Payer: Self-pay | Admitting: Emergency Medicine

## 2020-10-17 ENCOUNTER — Emergency Department (HOSPITAL_COMMUNITY): Payer: Medicare Other

## 2020-10-17 ENCOUNTER — Telehealth: Payer: Self-pay | Admitting: Physician Assistant

## 2020-10-17 ENCOUNTER — Inpatient Hospital Stay (HOSPITAL_COMMUNITY)
Admission: EM | Admit: 2020-10-17 | Discharge: 2020-10-19 | DRG: 291 | Disposition: A | Discharge status: Home / Self Care | Payer: Medicare Other | Attending: Family Medicine | Admitting: Family Medicine

## 2020-10-17 DIAGNOSIS — Z833 Family history of diabetes mellitus: Secondary | ICD-10-CM

## 2020-10-17 DIAGNOSIS — M8580 Other specified disorders of bone density and structure, unspecified site: Secondary | ICD-10-CM | POA: Diagnosis present

## 2020-10-17 DIAGNOSIS — N1831 Chronic kidney disease, stage 3a: Secondary | ICD-10-CM | POA: Diagnosis present

## 2020-10-17 DIAGNOSIS — R6884 Jaw pain: Secondary | ICD-10-CM | POA: Diagnosis present

## 2020-10-17 DIAGNOSIS — Z20822 Contact with and (suspected) exposure to covid-19: Secondary | ICD-10-CM | POA: Diagnosis not present

## 2020-10-17 DIAGNOSIS — I5023 Acute on chronic systolic (congestive) heart failure: Secondary | ICD-10-CM | POA: Diagnosis present

## 2020-10-17 DIAGNOSIS — E1169 Type 2 diabetes mellitus with other specified complication: Secondary | ICD-10-CM | POA: Diagnosis present

## 2020-10-17 DIAGNOSIS — I152 Hypertension secondary to endocrine disorders: Secondary | ICD-10-CM | POA: Diagnosis present

## 2020-10-17 DIAGNOSIS — I130 Hypertensive heart and chronic kidney disease with heart failure and stage 1 through stage 4 chronic kidney disease, or unspecified chronic kidney disease: Principal | ICD-10-CM | POA: Diagnosis present

## 2020-10-17 DIAGNOSIS — Z886 Allergy status to analgesic agent status: Secondary | ICD-10-CM

## 2020-10-17 DIAGNOSIS — M069 Rheumatoid arthritis, unspecified: Secondary | ICD-10-CM | POA: Diagnosis present

## 2020-10-17 DIAGNOSIS — I499 Cardiac arrhythmia, unspecified: Secondary | ICD-10-CM | POA: Diagnosis not present

## 2020-10-17 DIAGNOSIS — Z7984 Long term (current) use of oral hypoglycemic drugs: Secondary | ICD-10-CM

## 2020-10-17 DIAGNOSIS — H547 Unspecified visual loss: Secondary | ICD-10-CM | POA: Diagnosis present

## 2020-10-17 DIAGNOSIS — M4722 Other spondylosis with radiculopathy, cervical region: Secondary | ICD-10-CM | POA: Diagnosis not present

## 2020-10-17 DIAGNOSIS — Z880 Allergy status to penicillin: Secondary | ICD-10-CM

## 2020-10-17 DIAGNOSIS — R002 Palpitations: Secondary | ICD-10-CM | POA: Diagnosis present

## 2020-10-17 DIAGNOSIS — Z743 Need for continuous supervision: Secondary | ICD-10-CM | POA: Diagnosis not present

## 2020-10-17 DIAGNOSIS — E1142 Type 2 diabetes mellitus with diabetic polyneuropathy: Secondary | ICD-10-CM | POA: Diagnosis present

## 2020-10-17 DIAGNOSIS — I471 Supraventricular tachycardia: Secondary | ICD-10-CM | POA: Diagnosis not present

## 2020-10-17 DIAGNOSIS — M722 Plantar fascial fibromatosis: Secondary | ICD-10-CM | POA: Diagnosis present

## 2020-10-17 DIAGNOSIS — J310 Chronic rhinitis: Secondary | ICD-10-CM | POA: Diagnosis present

## 2020-10-17 DIAGNOSIS — F329 Major depressive disorder, single episode, unspecified: Secondary | ICD-10-CM | POA: Diagnosis present

## 2020-10-17 DIAGNOSIS — Z9851 Tubal ligation status: Secondary | ICD-10-CM

## 2020-10-17 DIAGNOSIS — Z91018 Allergy to other foods: Secondary | ICD-10-CM

## 2020-10-17 DIAGNOSIS — H903 Sensorineural hearing loss, bilateral: Secondary | ICD-10-CM | POA: Diagnosis present

## 2020-10-17 DIAGNOSIS — E1122 Type 2 diabetes mellitus with diabetic chronic kidney disease: Secondary | ICD-10-CM | POA: Diagnosis present

## 2020-10-17 DIAGNOSIS — R079 Chest pain, unspecified: Secondary | ICD-10-CM | POA: Diagnosis not present

## 2020-10-17 DIAGNOSIS — I110 Hypertensive heart disease with heart failure: Secondary | ICD-10-CM | POA: Diagnosis not present

## 2020-10-17 DIAGNOSIS — R42 Dizziness and giddiness: Secondary | ICD-10-CM | POA: Diagnosis not present

## 2020-10-17 DIAGNOSIS — R0602 Shortness of breath: Secondary | ICD-10-CM | POA: Diagnosis not present

## 2020-10-17 DIAGNOSIS — R1110 Vomiting, unspecified: Secondary | ICD-10-CM | POA: Diagnosis not present

## 2020-10-17 DIAGNOSIS — Z8249 Family history of ischemic heart disease and other diseases of the circulatory system: Secondary | ICD-10-CM

## 2020-10-17 DIAGNOSIS — G20 Parkinson's disease: Secondary | ICD-10-CM | POA: Diagnosis present

## 2020-10-17 DIAGNOSIS — F0280 Dementia in other diseases classified elsewhere without behavioral disturbance: Secondary | ICD-10-CM | POA: Diagnosis present

## 2020-10-17 DIAGNOSIS — I5043 Acute on chronic combined systolic (congestive) and diastolic (congestive) heart failure: Secondary | ICD-10-CM | POA: Diagnosis present

## 2020-10-17 DIAGNOSIS — M5010 Cervical disc disorder with radiculopathy, unspecified cervical region: Secondary | ICD-10-CM | POA: Diagnosis present

## 2020-10-17 DIAGNOSIS — I428 Other cardiomyopathies: Secondary | ICD-10-CM | POA: Diagnosis present

## 2020-10-17 DIAGNOSIS — R0600 Dyspnea, unspecified: Secondary | ICD-10-CM | POA: Diagnosis not present

## 2020-10-17 DIAGNOSIS — R404 Transient alteration of awareness: Secondary | ICD-10-CM | POA: Diagnosis not present

## 2020-10-17 DIAGNOSIS — Z95810 Presence of automatic (implantable) cardiac defibrillator: Secondary | ICD-10-CM

## 2020-10-17 DIAGNOSIS — R6889 Other general symptoms and signs: Secondary | ICD-10-CM | POA: Diagnosis not present

## 2020-10-17 DIAGNOSIS — K219 Gastro-esophageal reflux disease without esophagitis: Secondary | ICD-10-CM | POA: Diagnosis present

## 2020-10-17 DIAGNOSIS — R000 Tachycardia, unspecified: Secondary | ICD-10-CM | POA: Diagnosis not present

## 2020-10-17 DIAGNOSIS — G249 Dystonia, unspecified: Secondary | ICD-10-CM | POA: Diagnosis present

## 2020-10-17 DIAGNOSIS — G4730 Sleep apnea, unspecified: Secondary | ICD-10-CM | POA: Diagnosis present

## 2020-10-17 DIAGNOSIS — Z888 Allergy status to other drugs, medicaments and biological substances status: Secondary | ICD-10-CM

## 2020-10-17 DIAGNOSIS — I5022 Chronic systolic (congestive) heart failure: Secondary | ICD-10-CM

## 2020-10-17 DIAGNOSIS — R1310 Dysphagia, unspecified: Secondary | ICD-10-CM | POA: Diagnosis not present

## 2020-10-17 DIAGNOSIS — Z9049 Acquired absence of other specified parts of digestive tract: Secondary | ICD-10-CM

## 2020-10-17 DIAGNOSIS — E785 Hyperlipidemia, unspecified: Secondary | ICD-10-CM | POA: Diagnosis present

## 2020-10-17 LAB — HEPATIC FUNCTION PANEL
ALT: 7 U/L (ref 0–44)
AST: 22 U/L (ref 15–41)
Albumin: 3.9 g/dL (ref 3.5–5.0)
Alkaline Phosphatase: 96 U/L (ref 38–126)
Bilirubin, Direct: 0.1 mg/dL (ref 0.0–0.2)
Indirect Bilirubin: 0.7 mg/dL (ref 0.3–0.9)
Total Bilirubin: 0.8 mg/dL (ref 0.3–1.2)
Total Protein: 7.5 g/dL (ref 6.5–8.1)

## 2020-10-17 LAB — BASIC METABOLIC PANEL
Anion gap: 11 (ref 5–15)
BUN: 19 mg/dL (ref 8–23)
CO2: 22 mmol/L (ref 22–32)
Calcium: 9.8 mg/dL (ref 8.9–10.3)
Chloride: 104 mmol/L (ref 98–111)
Creatinine, Ser: 1.09 mg/dL — ABNORMAL HIGH (ref 0.44–1.00)
GFR, Estimated: 54 mL/min — ABNORMAL LOW (ref >60)
Glucose, Bld: 160 mg/dL — ABNORMAL HIGH (ref 70–99)
Potassium: 4.4 mmol/L (ref 3.5–5.1)
Sodium: 137 mmol/L (ref 135–145)

## 2020-10-17 LAB — CBC
HCT: 39 % (ref 36.0–46.0)
Hemoglobin: 13.2 g/dL (ref 12.0–15.0)
MCH: 30.6 pg (ref 26.0–34.0)
MCHC: 33.8 g/dL (ref 30.0–36.0)
MCV: 90.5 fL (ref 80.0–100.0)
Platelets: 230 10*3/uL (ref 150–400)
RBC: 4.31 MIL/uL (ref 3.87–5.11)
RDW: 11.9 % (ref 11.5–15.5)
WBC: 11 10*3/uL — ABNORMAL HIGH (ref 4.0–10.5)
nRBC: 0 % (ref 0.0–0.2)

## 2020-10-17 LAB — PROTIME-INR
INR: 1 (ref 0.8–1.2)
Prothrombin Time: 12.5 seconds (ref 11.4–15.2)

## 2020-10-17 LAB — URINALYSIS, ROUTINE W REFLEX MICROSCOPIC
Bilirubin Urine: NEGATIVE
Glucose, UA: NEGATIVE mg/dL
Hgb urine dipstick: NEGATIVE
Ketones, ur: NEGATIVE mg/dL
Nitrite: NEGATIVE
Protein, ur: NEGATIVE mg/dL
Specific Gravity, Urine: 1.004 — ABNORMAL LOW (ref 1.005–1.030)
pH: 5 (ref 5.0–8.0)

## 2020-10-17 LAB — BRAIN NATRIURETIC PEPTIDE: B Natriuretic Peptide: 20.6 pg/mL (ref 0.0–100.0)

## 2020-10-17 LAB — TSH: TSH: 2.143 u[IU]/mL (ref 0.350–4.500)

## 2020-10-17 LAB — RESP PANEL BY RT-PCR (FLU A&B, COVID) ARPGX2
Influenza A by PCR: NEGATIVE
Influenza B by PCR: NEGATIVE
SARS Coronavirus 2 by RT PCR: NEGATIVE

## 2020-10-17 LAB — TROPONIN I (HIGH SENSITIVITY)
Troponin I (High Sensitivity): 7 ng/L (ref <18)
Troponin I (High Sensitivity): 11 ng/L (ref <18)

## 2020-10-17 LAB — PHOSPHORUS: Phosphorus: 2.6 mg/dL (ref 2.5–4.6)

## 2020-10-17 LAB — MAGNESIUM: Magnesium: 1.8 mg/dL (ref 1.7–2.4)

## 2020-10-17 MED ORDER — MELATONIN 5 MG PO TABS
5.0000 mg | ORAL_TABLET | Freq: Every day | ORAL | Status: DC
  Administered 2020-10-17 – 2020-10-18 (×2): 5 mg via ORAL
  Filled 2020-10-17 (×2): qty 1

## 2020-10-17 MED ORDER — LOSARTAN POTASSIUM 25 MG PO TABS
25.0000 mg | ORAL_TABLET | Freq: Every day | ORAL | Status: DC
  Administered 2020-10-18 – 2020-10-19 (×2): 25 mg via ORAL
  Filled 2020-10-17 (×2): qty 1

## 2020-10-17 MED ORDER — ONDANSETRON HCL 4 MG/2ML IJ SOLN: 4.0000 mg | Freq: Four times a day (QID) | INTRAMUSCULAR | Status: DC | PRN

## 2020-10-17 MED ORDER — METOPROLOL TARTRATE 5 MG/5ML IV SOLN
5.0000 mg | Freq: Once | INTRAVENOUS | Status: AC
  Administered 2020-10-17: 5 mg via INTRAVENOUS
  Filled 2020-10-17: qty 5

## 2020-10-17 MED ORDER — FLUTICASONE FUROATE-VILANTEROL 200-25 MCG/INH IN AEPB
1.0000 | INHALATION_SPRAY | Freq: Every day | RESPIRATORY_TRACT | Status: DC
  Administered 2020-10-18 – 2020-10-19 (×2): 1 via RESPIRATORY_TRACT
  Filled 2020-10-17: qty 28

## 2020-10-17 MED ORDER — ACETAMINOPHEN 325 MG PO TABS: 650.0000 mg | ORAL_TABLET | ORAL | Status: DC | PRN

## 2020-10-17 MED ORDER — INSULIN ASPART 100 UNIT/ML ~~LOC~~ SOLN
0.0000 [IU] | Freq: Three times a day (TID) | SUBCUTANEOUS | Status: DC
  Administered 2020-10-18 (×2): 1 [IU] via SUBCUTANEOUS
  Administered 2020-10-19: 3 [IU] via SUBCUTANEOUS
  Administered 2020-10-19: 2 [IU] via SUBCUTANEOUS

## 2020-10-17 MED ORDER — METOPROLOL SUCCINATE ER 50 MG PO TB24
50.0000 mg | ORAL_TABLET | Freq: Two times a day (BID) | ORAL | Status: DC
  Administered 2020-10-17 – 2020-10-19 (×4): 50 mg via ORAL
  Filled 2020-10-17 (×3): qty 1
  Filled 2020-10-17: qty 2

## 2020-10-17 MED ORDER — LOSARTAN POTASSIUM 50 MG PO TABS: 25.0000 mg | ORAL_TABLET | Freq: Every day | ORAL | Status: DC

## 2020-10-17 MED ORDER — SPIRONOLACTONE 12.5 MG HALF TABLET
12.5000 mg | ORAL_TABLET | Freq: Every day | ORAL | Status: DC
  Administered 2020-10-18 – 2020-10-19 (×2): 12.5 mg via ORAL
  Filled 2020-10-17 (×2): qty 1

## 2020-10-17 MED ORDER — FAMOTIDINE 20 MG PO TABS
20.0000 mg | ORAL_TABLET | Freq: Two times a day (BID) | ORAL | Status: DC
  Administered 2020-10-17 – 2020-10-19 (×4): 20 mg via ORAL
  Filled 2020-10-17 (×4): qty 1

## 2020-10-17 MED ORDER — CARBIDOPA-LEVODOPA 25-100 MG PO TABS
1.0000 | ORAL_TABLET | Freq: Three times a day (TID) | ORAL | Status: DC
  Administered 2020-10-17 – 2020-10-19 (×5): 1 via ORAL
  Filled 2020-10-17 (×7): qty 1

## 2020-10-17 MED ORDER — LACTATED RINGERS IV SOLN: INTRAVENOUS | Status: DC

## 2020-10-17 MED ORDER — ENOXAPARIN SODIUM 40 MG/0.4ML ~~LOC~~ SOLN
40.0000 mg | Freq: Every day | SUBCUTANEOUS | Status: DC
  Administered 2020-10-17 – 2020-10-18 (×2): 40 mg via SUBCUTANEOUS
  Filled 2020-10-17 (×2): qty 0.4

## 2020-10-17 MED ORDER — IOHEXOL 350 MG/ML SOLN
80.0000 mL | Freq: Once | INTRAVENOUS | Status: AC | PRN
  Administered 2020-10-17: 80 mL via INTRAVENOUS

## 2020-10-17 NOTE — ED Triage Notes (Signed)
Pt to triage via [facility] from home.  Reports palpitations since yesterday- gradually worse today.  Reports bilateral jaw pain, dizziness, and headache that started today.  Denies vomiting and diarrhea.  NS 500cc bolus.  20g LFA.  ASA 324mg  given PTA.

## 2020-10-17 NOTE — ED Notes (Signed)
Iv continued

## 2020-10-17 NOTE — ED Notes (Signed)
Pt up to the br  She reports that she takes diurectics and has to go a lot

## 2020-10-17 NOTE — Telephone Encounter (Signed)
70 y.o. female with hx of (HFrEF) heart failure with reduced ejection fraction, non-ischemic cardiomyopathy, asthma.  She called answering service with symptoms of headache and high BP.    When I returned the call, a family member answered the phone and noted that EMS had been called and was at her house then.  Plan per EMS.  Richardson Dopp, PA-C    10/17/2020 10:47 AM

## 2020-10-17 NOTE — Consult Note (Signed)
Cardiology Consultation:   Patient ID: Veronica Shaw MRN: [id number]; DOB: [birth year]  Admit date: 10/17/2020 Date of Consult: 10/17/2020  Primary Care Provider: [name], DO Primary Cardiologist: Sherren Mocha, MD  Primary Electrophysiologist:  Cristopher Peru, MD    Patient Profile:   Veronica Shaw is a 70 y.o. female with a hx of niCM, recovered HFrEF (previously 20% now 60%), s/p dual chamber ICD, DM, HLD, HTN who is being seen today for the evaluation of shortness of breath and jaw pain.  History of Present Illness:   Veronica Shaw reports symptoms of palpitations occurring intermittently over the past 1 to 2 weeks.  These were associated with some shortness of breath and lightheadedness.  She has checked her heart rate during at least 1 of these episodes and found this to be in the 170s with what she recalls to be a normal blood pressure.  Earlier than on the day of admission she describes symptoms of bilateral jaw pain and shortness of breath.  She had never had anything like this before.  She denies any chest pain or chest pressure.  She has had no shocks by her device.  She has been adherent to all of her medications.  She denies any orthopnea, PND, leg swelling, fevers, chills, dysuria, nausea, vomiting, coughing.  She contacted her cardiologist office with the symptoms and states she was told to go to the emergency department for evaluation.  In the ED, the patient was found to have what was thought to be sinus tachycardia with heart rates initially in the 130s.  She was given several doses of IV metoprolol and her heart rates improved to the 110s.  Her blood pressures were within reasonably normal range.  Her laboratory studies were largely unremarkable, including a negative troponin x2.  A CT angiogram of the chest was performed that was negative for PE.  She was admitted to the hospitalist service for observation.  On my assessment, the patient describes  continued bilateral jaw pain, shortness of breath and palpitations.  Heart Pathway Score:     Past Medical History:  Diagnosis Date  . Asthma   . Back pain 09/18/2019  . Cervical disc disorder with radiculopathy of cervical region 09/24/2018  . Chronic systolic CHF (congestive heart failure) ([place])    a. cMRI 4/15: EF 34% and findings - c/w NICM, normal RV size and function (RVEF 61%), Mild MR // b. Echo 2/15:  EF 30-35%, diff HK, ant-sept AK, Gr 2 DD, mild MR, trivial TR  //  c. Echo 5/17: EF 20-25%, severe diffuse HK, marked systolic dyssynchrony, grade 1 diastolic dysfunction, mild MR  //  d. RHC 5/17: Fick CO 2.9, RVSP 19, PASP 15, PW mean 2, low filing pressures and preserved CO   . Cognitive impairment 10/19/2017   MOCA was administered with a score of 23/30  . Diabetes mellitus   . Diabetic peripheral neuropathy ([place]) 12/09/2011  . Flu 10/17/2017  . Gastritis   . History of echocardiogram    Echo 6/18: EF 30-35, diffuse HK, grade 1 diastolic dysfunction, trivial MR, mild LAE, mild TR, no pericardial effusion  . History of nuclear stress test    Myoview 5/18: EF 49, no ischemia, inferoseptal defect c/w LBBB artifact (intermediate risk due to EF < 50).  [name] Kitchen HTN (hypertension)   . Hyperlipidemia   . Hyperlipidemia associated with type 2 diabetes mellitus ([place]) 03/05/2019  . Hypertension associated with diabetes ([place]) 09/28/2006   Qualifier: Diagnosis of  By: [name]    . Major depression 03/31/2013  . Melena 08/21/2020  . NICM (nonischemic cardiomyopathy) ([place])    a. Nuclear 5/13: Normal stress nuclear study. LV Ejection Fraction: 58%  //  b. LHC 10/14: Minor luminal irregularity in prox LAD, EF 35%   . Parkinson disease ([place])   . Plantar fasciitis   . Plantar fasciitis, bilateral 06/15/2012  . Rosacea 05/29/2009   Qualifier: Diagnosis of  By: [name] MD, Rodman Key    . Sensorineural hearing loss (SNHL), bilateral 01/04/2017  . Sleep apnea    was retested and no longer had it and so  d/c CPAP  . Syncope   . Thoracic or lumbosacral neuritis or radiculitis, unspecified 07/03/2013  . Tinnitus, bilateral 01/04/2017  . Type II diabetes mellitus with complication ([place]) 0/05/2724   Diabetic eye exam done by [facility]: Dr. Prudencio Burly on 11/08/13: no diabetic retinopathy. Repeat in 1 year    . Urticaria     Past Surgical History:  Procedure Laterality Date  . BREAST EXCISIONAL BIOPSY Left 1970   benign cyst  . BREAST SURGERY Left   . BUNIONECTOMY    . CARDIAC CATHETERIZATION N/A 12/16/2015   Procedure: Right Heart Cath;  Surgeon: Sherren Mocha, MD;  Location: [facility];  Service: Cardiovascular;  Laterality: N/A;  . CHOLECYSTECTOMY    . eye lid surgery Bilateral 05/09/2019  . IMPLANTABLE CARDIOVERTER DEFIBRILLATOR IMPLANT  11-25-13   MDT dual chamber ICD implanted by [name] for primary prevention  . IMPLANTABLE CARDIOVERTER DEFIBRILLATOR IMPLANT N/A 11/25/2013   Procedure: IMPLANTABLE CARDIOVERTER DEFIBRILLATOR IMPLANT;  Surgeon: [name], MD;  Location: [facility] CATH LAB;  Service: Cardiovascular;  Laterality: N/A;  . LEFT AND RIGHT HEART CATHETERIZATION WITH CORONARY ANGIOGRAM N/A 05/31/2013   Procedure: LEFT AND RIGHT HEART CATHETERIZATION WITH CORONARY ANGIOGRAM;  Surgeon: [name], MD;  Location[facility];  Service: Cardiovascular;  Laterality: N/A;  . TONSILLECTOMY    . TUBAL LIGATION       Home Medications:  Prior to Admission medications   Medication Sig Start Date End Date Taking? Authorizing Provider  APPLE CIDER VINEGAR PO Take 1 capsule by mouth daily.   Yes [provider]  Biotin 5000 MCG CAPS Take 1 tablet by mouth daily.    Yes [provider]  carbidopa-levodopa (SINEMET IR) 25-100 MG tablet TAKE 1 TABLET BY MOUTH THREE TIMES DAILY 09/28/20  Yes Tat, Eustace Quail, DO  famotidine (PEPCID) 20 MG tablet TAKE 1 TABLET TWICE DAILY 06/17/19  Yes Tammi Klippel, Sherin, DO  fluticasone (FLONASE SENSIMIST) 27.5 MCG/SPRAY nasal  spray Place 2 sprays into the nose daily. 08/20/20  Yes McDiarmid, [name], MD  furosemide (LASIX) 40 MG tablet TAKE 1/2 TABLET EVERY DAY AS NEEDED FOR  FLUID/SWELLING 03/25/20  Yes Beard, Samantha N, DO  levalbuterol (XOPENEX) 0.63 MG/3ML nebulizer solution USE 1 VIAL IN NEBULIZER EVERY 4 TO 6 HOURS AS NEEDED FOR WHEEZING AND FOR SHORTNESS OF BREATH 10/14/20  Yes Mannam, Praveen, MD  losartan (COZAAR) 25 MG tablet TAKE 1 TABLET EVERY DAY 04/01/20  Yes Sherren Mocha, MD  meclizine (ANTIVERT) 12.5 MG tablet Take 1 tablet (12.5 mg total) by mouth daily as needed for dizziness. Should not be long term. 10/02/20  Yes Beard, Samantha N, DO  melatonin 5 MG TABS Take 5 mg by mouth at bedtime.   Yes [provider]  metFORMIN (GLUCOPHAGE) 500 MG tablet Take 2 tablets (1,000 mg total) by mouth 2 (two) times daily with  a meal. Patient taking differently: Take 500 mg by mouth 2 (two) times daily with a meal. 06/24/20  Yes Beard, Samantha N, DO  metoprolol succinate (TOPROL-XL) 50 MG 24 hr tablet TAKE 1 TABLET TWICE DAILY 06/05/20  Yes [name], MD  potassium chloride (KLOR-CON) 10 MEQ tablet Take 10 mEq by mouth daily as needed (low potassium).   Yes [provider]  spironolactone (ALDACTONE) 25 MG tablet TAKE 1/2 TABLET EVERY DAY 04/01/20  Yes Sherren Mocha, MD  Ubiquinone (ULTRA COQ10 PO) Take 1 tablet by mouth 3 (three) times a week.   Yes [provider]  budesonide-formoterol (SYMBICORT) 160-4.5 MCG/ACT inhaler Inhale 2 puffs into the lungs 2 (two) times daily. Patient not taking: Reported on 10/17/2020 10/24/19   Lauraine Rinne, NP  OXYGEN Inhale into the lungs at bedtime. Inhale 2L into lungs    [provider]    Inpatient Medications: Scheduled Meds: . carbidopa-levodopa  1 tablet Oral TID  . enoxaparin (LOVENOX) injection  40 mg Subcutaneous QHS  . famotidine  20 mg Oral BID  . fluticasone furoate-vilanterol  1 puff Inhalation Daily  . [START ON 10/18/2020]  insulin aspart  0-9 Units Subcutaneous TID WC  . [START ON 10/18/2020] losartan  25 mg Oral Daily  . melatonin  5 mg Oral QHS  . metoprolol succinate  50 mg Oral BID  . [START ON 10/18/2020] spironolactone  12.5 mg Oral Daily   Continuous Infusions: . lactated ringers 75 mL/hr at 10/17/20 1719   PRN Meds: acetaminophen, ondansetron (ZOFRAN) IV  Allergies:    Allergies  Allergen Reactions  . Aspirin Swelling and Other (See Comments)    Other reaction(s): Other (See Comments) Causes nose bleeds *ONLY THE COATED ASA* Causes nose bleeds *ONLY THE COATED ASA*  . Penicillins Shortness Of Breath and Rash    Shortness of Breath - Throat felt like it was closing.   [name] [Conj Estrog-Medroxyprogest Ace] Shortness Of Breath    Throat swelling Throat swelling  . Ace Inhibitors Cough  . Simvastatin Other (See Comments) and Rash    Muscle aches    Social History:   Social History   Socioeconomic History  . Marital status: Married    Spouse name: [name]   . Number of children: 2  . Years of education: 67  . Highest education level: 12th grade  Occupational History  . Occupation: retired    Comment: CNA  Tobacco Use  . Smoking status: Never Smoker  . Smokeless tobacco: Never Used  . Tobacco comment: exposed to smoke during child hood (parents)  Vaping Use  . Vaping Use: Never used  Substance and Sexual Activity  . Alcohol use: No    Alcohol/week: 0.0 standard drinks  . Drug use: No  . Sexual activity: Yes    Birth control/protection: Post-menopausal  Other Topics Concern  . Not on file  Social History Narrative   Patient lives with her husband [name].   Patient enjoys spending time with her family, cooking, and her 2 dogs.    Patient has one daughter who lives locally, and one son who lives in [place].   Social Determinants of Health   Financial Resource Strain: Low Risk   . Difficulty of Paying Living Expenses: Not hard at all  Food Insecurity: No Food Insecurity   . Worried About Charity fundraiser in the Last Year: Never true  . Ran Out of Food in the Last Year: Never true  Transportation Needs: No  Transportation Needs  . Lack of Transportation (Medical): No  . Lack of Transportation (Non-Medical): No  Physical Activity: Sufficiently Active  . Days of Exercise per Week: 7 days  . Minutes of Exercise per Session: 30 min  Stress: No Stress Concern Present  . Feeling of Stress : Not at all  Social Connections: Moderately Integrated  . Frequency of Communication with Friends and Family: More than three times a week  . Frequency of Social Gatherings with Friends and Family: More than three times a week  . Attends Religious Services: More than 4 times per year  . Active Member of Clubs or Organizations: No  . Attends Archivist Meetings: Never  . Marital Status: Married  Human resources officer Violence: Not At Risk  . Fear of Current or Ex-Partner: No  . Emotionally Abused: No  . Physically Abused: No  . Sexually Abused: No    Family History:   Family History  Problem Relation Age of Onset  . Coronary artery disease Father        Died age 57  . Heart attack Father   . Diabetes Father   . Coronary artery disease Mother        Died age 21  . Heart attack Mother   . Diabetes Mother   . Parkinson's disease Sister   . Heart disease Sister   . Hepatitis C Sister   . Diabetes Sister   . Diabetes Son 37       T1DM  . Healthy Daughter   . Diabetes Sister   . Heart disease Sister   . Diabetes Sister   . Heart disease Sister   . Breast cancer Neg Hx      ROS:  Please see the history of present illness.  All other ROS reviewed and negative.     Physical Exam/Data:   Vitals:   10/17/20 1809 10/17/20 1830 10/17/20 1900 10/17/20 1930  BP:  (!) 120/57 102/84 119/66  Pulse:  (!) 102 (!) 108 (!) 108  Resp:  16 18 16   Temp: 98.2 F (36.8 C)   98 F (36.7 C)  TempSrc:    Oral  SpO2:  97% 96% 96%   No intake or output data in the  24 hours ending 10/17/20 2313 Last 3 Weights 10/07/2020 09/15/2020 08/20/2020  Weight (lbs) 137 lb 134 lb 12.8 oz 135 lb 4 oz  Weight (kg) 62.143 kg 61.145 kg 61.349 kg     There is no height or weight on file to calculate BMI.  General:  Well nourished, well developed, in no acute distress HEENT: normal Neck: no JVD Cardiac:  normal S1, S2; tachycardic; no appreciable murmurs Lungs:  clear to auscultation bilaterally, no wheezing, rhonchi or rales  Abd: soft, nontender, no hepatomegaly  Ext: no edema  EKG:  The EKG was personally reviewed and demonstrates what appears to be sinus tachycardia (1:1 A:V), wide complex QRS in IVCD pattern  Relevant CV Studies: Echo 2019 LVEF 57% Grade 2 diastolic dysfunction  Laboratory Data:  High Sensitivity Troponin:   Recent Labs  Lab 10/17/20 1156 10/17/20 1402  TROPONINIHS 7 11     Chemistry Recent Labs  Lab 10/17/20 1156  NA 137  K 4.4  CL 104  CO2 22  GLUCOSE 160*  BUN 19  CREATININE 1.09*  CALCIUM 9.8  GFRNONAA 54*  ANIONGAP 11    Recent Labs  Lab 10/17/20 1402  PROT 7.5  ALBUMIN 3.9  AST 22  ALT 7  ALKPHOS 96  BILITOT 0.8   Hematology Recent Labs  Lab 10/17/20 1156  WBC 11.0*  RBC 4.31  HGB 13.2  HCT 39.0  MCV 90.5  MCH 30.6  MCHC 33.8  RDW 11.9  PLT 230   BNP Recent Labs  Lab 10/17/20 1156  BNP 20.6    DDimer No results for input(s): DDIMER in the last 168 hours.   Radiology/Studies:  DG Chest 2 View  Result Date: 10/17/2020 CLINICAL DATA:  Chest pain EXAM: CHEST - 2 VIEW COMPARISON:  Chest radiograph dated 06/15/2020 FINDINGS: The heart size and mediastinal contours are within normal limits. Both lungs are clear. Degenerative changes are seen in the spine. A left subclavian approach cardiac device is redemonstrated. IMPRESSION: No active cardiopulmonary disease. Electronically Signed   By: Zerita Boers M.D.   On: 10/17/2020 13:01   CT Angio Chest PE W/Cm &/Or Wo Cm  Result Date:  10/17/2020 CLINICAL DATA:  Cardiac palpitations with pain in the jaw, dizziness, and headache EXAM: CT ANGIOGRAPHY CHEST WITH CONTRAST TECHNIQUE: Multidetector CT imaging of the chest was performed using the standard protocol during bolus administration of intravenous contrast. Multiplanar CT image reconstructions and MIPs were obtained to evaluate the vascular anatomy. CONTRAST:  66mL OMNIPAQUE IOHEXOL 350 MG/ML SOLN COMPARISON:  Chest radiograph 10/17/2020 and CT chest from 04/20/2013 FINDINGS: Cardiovascular: No filling defect is identified in the pulmonary arterial tree to suggest pulmonary embolus. Atherosclerotic calcification of the aortic arch and right subclavian artery. Aberrant right subclavian artery passes behind the esophagus, which can cause dysphagia lusoria. AICD noted. Mediastinum/Nodes: Unremarkable Lungs/Pleura: Mild right apical scarring versus atelectasis. Mild scarring or atelectasis in the posterior basal segments of both lower lobes. Left lower lobe granuloma on image 70 series 7 common no change from 2014. Upper Abdomen: Cholecystectomy. Musculoskeletal: Thoracic spondylosis. Review of the MIP images confirms the above findings. IMPRESSION: 1. No filling defect is identified in the pulmonary arterial tree to suggest pulmonary embolus. 2. Aberrant right subclavian artery passes behind the esophagus, which can cause dysphagia lusoria. 3. Mild scarring or atelectasis in the posterior basal segments of both lower lobes. 4. Aortic atherosclerosis. Aortic Atherosclerosis (ICD10-I70.0). Electronically Signed   By: Van Clines M.D.   On: 10/17/2020 18:35   Assessment and Plan:  Anyla Mega Kinkade is a 70 y.o. female with a hx of niCM, recovered HFrEF (previously 20% now 60%), s/p dual chamber ICD, DM, HLD, HTN who is being seen today for the evaluation of shortness of breath and jaw pain.  The patient has undergone a fairly extensive work-up for what appears to be resting sinus  tachycardia.  She does appear to be somewhat volume deplete, which could be driving her symptoms and heart rhythm.  Fortunately her PE scan was negative and her troponin was normal, making acute coronary syndrome significantly less likely.  Interestingly, the patient does describe an episode years ago where she was hospitalized for very fast heart rates.  She does not recall the specifics about this episode.  I wonder if it is possible that her current rhythm is not in fact sinus tachycardia but rather an ectopic atrial tachycardia.  Review of her graphic trends from prior visits reveals a resting heart rate typically in the 80s to 90s but never in the 130s or above.  At this time it would be prudent to obtain an echocardiogram to confirm she has had no cardiac structural changes or worsening ventricular function.  We would also recommend having her pacemaker interrogated to  review her rate histograms for suggestion of ectopic atrial tachycardia.  In the meantime she should continue to be observed for any signs or symptoms of infection or metabolic abnormalities that would potentially cause resting sinus tachycardia.  - obtain echocardiogram - TSH normal - please have pacemaker interrogated in the AM to assess for evidence of ectopic atrial tachycardia - will have EP consult pending pacemaker interrogation - would hold lasix and give 1L IV fluid over 4 hours tonight to see if HR is responsive to volume - ok to continue home metoprolol, spironolactone, losartan - would stop home potassium if discontinuing lasix      For questions or updates, please contact [facility] Please consult [email or web address] for contact info under     Signed, Marcie Mowers, MD  10/17/2020 11:13 PM

## 2020-10-17 NOTE — ED Provider Notes (Signed)
[facility] EMERGENCY DEPARTMENT Provider Note   CSN: [id number] Arrival date & time: 10/17/20  1141     History Chief Complaint  Patient presents with  . Palpitations    Veronica Shaw is a 70 y.o. female.  HPI Patient reports that for several weeks she has had some increased problems with persistent shortness of breath.  She reports she has been using her inhalers as prescribed but not really getting much relief.  She reports periodically she is also getting lightheaded and fatigued and weak with activity.  Sporadically she is getting a sharp chest pain that is brief in her lower right chest.  It seems to be central.  She is not experiencing persistent chest pain.  She reports this morning however she awakened and she started to feel pain in her jaws as well as her heart racing.  She reports she felt more lightheaded and short of breath.  She took her heart rate and identified that her heart rate was as high as 177 at times.  She has a pacemaker and thought well, maybe the pacemaker would take care of the problem.  However, if she persisted in feeling tightness in her jaw, short of breath and racing heart she determined she get evaluated at the hospital.  Reports she has been compliant with all of her medications.  No recent fever chills or cough.  She reports she did have a laser therapy on her right eye several days ago.  She noted a little bit of puffiness by the eyes but has not been having eye pain, drainage or discharge. No lower extremity swelling or calf pain.    Past Medical History:  Diagnosis Date  . Asthma   . Back pain 09/18/2019  . Cervical disc disorder with radiculopathy of cervical region 09/24/2018  . Chronic systolic CHF (congestive heart failure) ([place])    a. cMRI 4/15: EF 34% and findings - c/w NICM, normal RV size and function (RVEF 61%), Mild MR // b. Echo 2/15:  EF 30-35%, diff HK, ant-sept AK, Gr 2 DD, mild MR, trivial TR  //  c. Echo 5/17: EF  20-25%, severe diffuse HK, marked systolic dyssynchrony, grade 1 diastolic dysfunction, mild MR  //  d. RHC 5/17: Fick CO 2.9, RVSP 19, PASP 15, PW mean 2, low filing pressures and preserved CO   . Cognitive impairment 10/19/2017   MOCA was administered with a score of 23/30  . Diabetes mellitus   . Diabetic peripheral neuropathy ([place]) 12/09/2011  . Flu 10/17/2017  . Gastritis   . History of echocardiogram    Echo 6/18: EF 30-35, diffuse HK, grade 1 diastolic dysfunction, trivial MR, mild LAE, mild TR, no pericardial effusion  . History of nuclear stress test    Myoview 5/18: EF 49, no ischemia, inferoseptal defect c/w LBBB artifact (intermediate risk due to EF < 50).  [name] Kitchen HTN (hypertension)   . Hyperlipidemia   . Hyperlipidemia associated with type 2 diabetes mellitus ([place]) 03/05/2019  . Hypertension associated with diabetes ([place]) 09/28/2006   Qualifier: Diagnosis of  By: [name]    . Major depression 03/31/2013  . Melena 08/21/2020  . NICM (nonischemic cardiomyopathy) ([place])    a. Nuclear 5/13: Normal stress nuclear study. LV Ejection Fraction: 58%  //  b. LHC 10/14: Minor luminal irregularity in prox LAD, EF 35%   . Parkinson disease ([place])   . Plantar fasciitis   . Plantar fasciitis, bilateral 06/15/2012  . Rosacea 05/29/2009  Qualifier: Diagnosis of  By: [name] MD, Rodman Key    . Sensorineural hearing loss (SNHL), bilateral 01/04/2017  . Sleep apnea    was retested and no longer had it and so d/c CPAP  . Syncope   . Thoracic or lumbosacral neuritis or radiculitis, unspecified 07/03/2013  . Tinnitus, bilateral 01/04/2017  . Type II diabetes mellitus with complication ([place]) 9/93/7169   Diabetic eye exam done by [facility]: Dr. Prudencio Burly on 11/08/13: no diabetic retinopathy. Repeat in 1 year    . Urticaria     Patient Active Problem List   Diagnosis Date Noted  . Tachycardia 10/17/2020  . Cervical dystonia 10/07/2020  . Advanced care planning/counseling discussion 08/21/2020   . Dementia without behavioral disturbance ([place]) 08/21/2020  . Vision impairment, Right Eye 08/21/2020  . Chronic kidney disease, stage 3a ([place]) 08/18/2020  . Frequent urination 07/30/2020  . Chronic rhinitis 12/18/2019  . Chronic respiratory failure with hypoxia ([place]) 12/18/2019  . Hyperlipidemia associated with type 2 diabetes mellitus ([place]) 03/05/2019  . Falls frequently   . Osteopenia 07/10/2015  . Mild persistent asthma in adult without complication 67/89/3810  . NICM (nonischemic cardiomyopathy) ([place]) 04/23/2014  . Arthritis or polyarthritis, rheumatoid ([place]) 03/12/2014  . ICD (implantable cardioverter-defibrillator), dual, in situ 03/06/2014  . Heart Failure with Recovered Ejection Fraction, G2DD 05/31/2013  . Parkinson disease ([place]) 05/21/2013  . Vertigo 03/03/2013  . Gastroesophageal reflux disease without esophagitis 09/27/2012  . Trigger point with neck pain 04/12/2012  . 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  . Diabetic peripheral neuropathy ([place]) 12/09/2011  . Chronic urticaria 05/27/2011  . 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 05/20/2011  . Type II diabetes mellitus with complication ([place]) 17/51/0258  . Hypertension associated with diabetes ([place]) 09/28/2006    Past Surgical History:  Procedure Laterality Date  . BREAST EXCISIONAL BIOPSY Left 1970   benign cyst  . BREAST SURGERY Left   . BUNIONECTOMY    . CARDIAC CATHETERIZATION N/A 12/16/2015   Procedure: Right Heart Cath;  Surgeon: Sherren Mocha, MD;  Location: [facility];  Service: Cardiovascular;  Laterality: N/A;  . CHOLECYSTECTOMY    . eye lid surgery Bilateral 05/09/2019  . IMPLANTABLE CARDIOVERTER DEFIBRILLATOR IMPLANT  11-25-13   MDT dual chamber ICD implanted by [name] for primary prevention  . IMPLANTABLE CARDIOVERTER DEFIBRILLATOR IMPLANT N/A 11/25/2013   Procedure: IMPLANTABLE CARDIOVERTER DEFIBRILLATOR IMPLANT;  Surgeon: [name], MD;  Location: [facility] CATH LAB;  Service: Cardiovascular;  Laterality:  N/A;  . LEFT AND RIGHT HEART CATHETERIZATION WITH CORONARY ANGIOGRAM N/A 05/31/2013   Procedure: LEFT AND RIGHT HEART CATHETERIZATION WITH CORONARY ANGIOGRAM;  Surgeon: [name], MD;  Location: [facility] CATH LAB;  Service: Cardiovascular;  Laterality: N/A;  . TONSILLECTOMY    . TUBAL LIGATION       OB History   No obstetric history on file.     Family History  Problem Relation Age of Onset  . Coronary artery disease Father        Died age 84  . Heart attack Father   . Diabetes Father   . Coronary artery disease Mother        Died age 31  . Heart attack Mother   . Diabetes Mother   . Parkinson's disease Sister   . Heart disease Sister   . Hepatitis C Sister   . Diabetes Sister   . Diabetes Son 37       T1DM  . Healthy Daughter   . Diabetes Sister   .  Heart disease Sister   . Diabetes Sister   . Heart disease Sister   . Breast cancer Neg Hx     Social History   Tobacco Use  . Smoking status: Never Smoker  . Smokeless tobacco: Never Used  . Tobacco comment: exposed to smoke during child hood (parents)  Vaping Use  . Vaping Use: Never used  Substance Use Topics  . Alcohol use: No    Alcohol/week: 0.0 standard drinks  . Drug use: No    Home Medications Prior to Admission medications   Medication Sig Start Date End Date Taking? Authorizing Provider  APPLE CIDER VINEGAR PO Take 1 capsule by mouth daily.   Yes [provider]  Biotin 5000 MCG CAPS Take 1 tablet by mouth daily.    Yes [provider]  carbidopa-levodopa (SINEMET IR) 25-100 MG tablet TAKE 1 TABLET BY MOUTH THREE TIMES DAILY 09/28/20  Yes Tat, Eustace Quail, DO  famotidine (PEPCID) 20 MG tablet TAKE 1 TABLET TWICE DAILY 06/17/19  Yes Tammi Klippel, Sherin, DO  fluticasone (FLONASE SENSIMIST) 27.5 MCG/SPRAY nasal spray Place 2 sprays into the nose daily. 08/20/20  Yes McDiarmid, [name], MD  furosemide (LASIX) 40 MG tablet TAKE 1/2 TABLET EVERY DAY AS NEEDED FOR  FLUID/SWELLING 03/25/20  Yes Beard,  Samantha N, DO  levalbuterol (XOPENEX) 0.63 MG/3ML nebulizer solution USE 1 VIAL IN NEBULIZER EVERY 4 TO 6 HOURS AS NEEDED FOR WHEEZING AND FOR SHORTNESS OF BREATH 10/14/20  Yes Mannam, Praveen, MD  losartan (COZAAR) 25 MG tablet TAKE 1 TABLET EVERY DAY 04/01/20  Yes Sherren Mocha, MD  meclizine (ANTIVERT) 12.5 MG tablet Take 1 tablet (12.5 mg total) by mouth daily as needed for dizziness. Should not be long term. 10/02/20  Yes Beard, Samantha N, DO  melatonin 5 MG TABS Take 5 mg by mouth at bedtime.   Yes [provider]  metFORMIN (GLUCOPHAGE) 500 MG tablet Take 2 tablets (1,000 mg total) by mouth 2 (two) times daily with a meal. Patient taking differently: Take 500 mg by mouth 2 (two) times daily with a meal. 06/24/20  Yes Darrelyn Hillock N, DO  metoprolol succinate (TOPROL-XL) 50 MG 24 hr tablet TAKE 1 TABLET TWICE DAILY 06/05/20  Yes [name], MD  potassium chloride (KLOR-CON) 10 MEQ tablet Take 10 mEq by mouth daily as needed (low potassium).   Yes [provider]  spironolactone (ALDACTONE) 25 MG tablet TAKE 1/2 TABLET EVERY DAY 04/01/20  Yes Sherren Mocha, MD  Ubiquinone (ULTRA COQ10 PO) Take 1 tablet by mouth 3 (three) times a week.   Yes [provider]  budesonide-formoterol (SYMBICORT) 160-4.5 MCG/ACT inhaler Inhale 2 puffs into the lungs 2 (two) times daily. Patient not taking: Reported on 10/17/2020 10/24/19   Lauraine Rinne, NP  OXYGEN Inhale into the lungs at bedtime. Inhale 2L into lungs    [provider]    Allergies    Aspirin, Penicillins, Prempro [conj estrog-medroxyprogest ace], Ace inhibitors, and Simvastatin  Review of Systems   Review of Systems 10 systems reviewed and negative except as per HPI Physical Exam Updated Vital Signs BP 119/66 (BP Location: Right Arm)   Pulse (!) 108   Temp 98 F (36.7 C) (Oral)   Resp 16   LMP 08/01/2000 (Approximate)   SpO2 96%   Physical Exam Constitutional:      Comments: Alert and  nontoxic.  Clinically well in appearance.  No respiratory distress.  HENT:     Head: Normocephalic and atraumatic.  Mouth/Throat:     Mouth: Mucous membranes are moist.     Pharynx: Oropharynx is clear.  Eyes:     Extraocular Movements: Extraocular movements intact.     Conjunctiva/sclera: Conjunctivae normal.     Pupils: Pupils are equal, round, and reactive to light.     Comments: Eyes normal in appearance to general inspection.  Extraocular motions are intact.  There is no conjunctival injection or significant periorbital swelling.  Cardiovascular:     Comments: Heart is for extreme tachycardia.  Cannot appreciate rub murmur gallop. Pulmonary:     Comments: No respiratory distress.  Speaking in full sentences without difficulty.  Fine crackle at the bases.  Lungs are grossly clear without active wheeze or rhonchi. Abdominal:     General: There is no distension.     Palpations: Abdomen is soft.     Tenderness: There is no abdominal tenderness. There is no guarding.  Musculoskeletal:        General: No swelling or tenderness. Normal range of motion.     Right lower leg: No edema.     Left lower leg: No edema.  Skin:    General: Skin is warm and dry.  Neurological:     General: No focal deficit present.     Mental Status: She is oriented to person, place, and time.     Motor: No weakness.  Psychiatric:        Mood and Affect: Mood normal.     ED Results / Procedures / Treatments   Labs (all labs ordered are listed, but only abnormal results are displayed) Labs Reviewed  BASIC METABOLIC PANEL - Abnormal; Notable for the following components:      Result Value   Glucose, Bld 160 (*)    Creatinine, Ser 1.09 (*)    GFR, Estimated 54 (*)    All other components within normal limits  CBC - Abnormal; Notable for the following components:   WBC 11.0 (*)    All other components within normal limits  URINALYSIS, ROUTINE W REFLEX MICROSCOPIC - Abnormal; Notable for the  following components:   APPearance HAZY (*)    Specific Gravity, Urine 1.004 (*)    Leukocytes,Ua SMALL (*)    Bacteria, UA RARE (*)    All other components within normal limits  RESP PANEL BY RT-PCR (FLU A&B, COVID) ARPGX2  BRAIN NATRIURETIC PEPTIDE  HEPATIC FUNCTION PANEL  PROTIME-INR  MAGNESIUM  PHOSPHORUS  TSH  TROPONIN I (HIGH SENSITIVITY)  TROPONIN I (HIGH SENSITIVITY)    EKG EKG Interpretation  Date/Time:  Saturday October 17 2020 11:54:26 EDT Ventricular Rate:  131 PR Interval:  112 QRS Duration: 122 QT Interval:  340 QTC Calculation: 502 R Axis:   75 Text Interpretation: Sinus tachycardia Left ventricular hypertrophy with QRS widening and repolarization abnormality ( Cornell product ) Anteroseptal infarct , age undetermined Abnormal ECG old LBBB. rate related st/t changes Confirmed by Charlesetta Shanks [phone]) on 10/17/2020 11:59:37 AM   Radiology DG Chest 2 View  Result Date: 10/17/2020 CLINICAL DATA:  Chest pain EXAM: CHEST - 2 VIEW COMPARISON:  Chest radiograph dated 06/15/2020 FINDINGS: The heart size and mediastinal contours are within normal limits. Both lungs are clear. Degenerative changes are seen in the spine. A left subclavian approach cardiac device is redemonstrated. IMPRESSION: No active cardiopulmonary disease. Electronically Signed   By: Zerita Boers M.D.   On: 10/17/2020 13:01   CT Angio Chest PE W/Cm &/Or Wo Cm  Result Date: 10/17/2020 CLINICAL DATA:  Cardiac palpitations with pain in the jaw, dizziness, and headache EXAM: CT ANGIOGRAPHY CHEST WITH CONTRAST TECHNIQUE: Multidetector CT imaging of the chest was performed using the standard protocol during bolus administration of intravenous contrast. Multiplanar CT image reconstructions and MIPs were obtained to evaluate the vascular anatomy. CONTRAST:  11mL OMNIPAQUE IOHEXOL 350 MG/ML SOLN COMPARISON:  Chest radiograph 10/17/2020 and CT chest from 04/20/2013 FINDINGS: Cardiovascular: No filling defect is  identified in the pulmonary arterial tree to suggest pulmonary embolus. Atherosclerotic calcification of the aortic arch and right subclavian artery. Aberrant right subclavian artery passes behind the esophagus, which can cause dysphagia lusoria. AICD noted. Mediastinum/Nodes: Unremarkable Lungs/Pleura: Mild right apical scarring versus atelectasis. Mild scarring or atelectasis in the posterior basal segments of both lower lobes. Left lower lobe granuloma on image 70 series 7 common no change from 2014. Upper Abdomen: Cholecystectomy. Musculoskeletal: Thoracic spondylosis. Review of the MIP images confirms the above findings. IMPRESSION: 1. No filling defect is identified in the pulmonary arterial tree to suggest pulmonary embolus. 2. Aberrant right subclavian artery passes behind the esophagus, which can cause dysphagia lusoria. 3. Mild scarring or atelectasis in the posterior basal segments of both lower lobes. 4. Aortic atherosclerosis. Aortic Atherosclerosis (ICD10-I70.0). Electronically Signed   By: Van Clines M.D.   On: 10/17/2020 18:35    Procedures Procedures  CRITICAL CARE Performed by: Charlesetta Shanks   Total critical care time: 30 minutes  Critical care time was exclusive of separately billable procedures and treating other patients.  Critical care was necessary to treat or prevent imminent or life-threatening deterioration.  Critical care was time spent personally by me on the following activities: development of treatment plan with patient and/or surrogate as well as nursing, discussions with consultants, evaluation of patient's response to treatment, examination of patient, obtaining history from patient or surrogate, ordering and performing treatments and interventions, ordering and review of laboratory studies, ordering and review of radiographic studies, pulse oximetry and re-evaluation of patient's condition. Medications Ordered in ED Medications  lactated ringers infusion  ( Intravenous New Bag/Given 10/17/20 1719)  metoprolol tartrate (LOPRESSOR) injection 5 mg (5 mg Intravenous Given 10/17/20 1406)  metoprolol tartrate (LOPRESSOR) injection 5 mg (5 mg Intravenous Given 10/17/20 1718)  iohexol (OMNIPAQUE) 350 MG/ML injection 80 mL (80 mLs Intravenous Contrast Given 10/17/20 1805)    ED Course  I have reviewed the triage vital signs and the nursing notes.  Pertinent labs & imaging results that were available during my care of the patient were reviewed by me and considered in my medical decision making (see chart for details).  Clinical Course as of 10/17/20 2030  Sat Oct 17, 2020  1544 Heart rate has come down to 105 with Lopressor.  Patient reports she feels improved.  At this time, 2 sets of troponins are negative.  Will consult cardiology and proceed with CT PE study. [MP]    Clinical Course User Index [MP] Charlesetta Shanks, MD   MDM Rules/Calculators/A&P                          Patient presents as outlined.  She has been experiencing shortness of breath above her baseline for several weeks.  She has been seen by pulmonology and been using her routine inhalers without relief.  Rarely she is getting a sharp lancinating chest pain.  Morning however she experienced discomfort in her jaws as well as increased heart rate and presents to the emergency department.  EKG shows tachycardia  in the 130s.  Patient is for sinus rhythm.  Possible flutter.  Patient takes Toprol as a baseline medication.  Will administer Lopressor slow rate and repeat EKG for further rhythm assessment.  CT angiogram does not show any PE present.  Cardiology, Dr. Sherron Ales consulted.  At this time, recommends admission to medical service with continued monitoring and echocardiogram.  Cardiology will consult.  Consult: Family medical teaching service for admission. Final Clinical Impression(s) / ED Diagnoses Final diagnoses:  Atrial tachycardia ([place])  Dyspnea, unspecified type     Rx / DC Orders ED Discharge Orders    None       Charlesetta Shanks, MD 10/17/20 2031

## 2020-10-17 NOTE — ED Notes (Signed)
Up to bathroom

## 2020-10-17 NOTE — H&P (Signed)
[facility] Admission History and Physical Service Pager: [phone]  Patient name: Veronica Shaw Medical record number: [id number] Date of birth: Feb 27, 1951 Age: 70 y.o. Gender: female  Primary Care Provider: [name], DO Consultants: Cardiology Code Status: Full code Preferred Emergency Contact: Christianne Dolin [phone]  Chief Complaint: Heart palpitations and jaw pain  Assessment and Plan: Veronica Shaw is a 70 y.o. female presenting with heart palpitations and jaw pain. PMH is significant for HFrEF, HTN, HLD, Parkinson's, asthma  Heart palpitations with shortness of breath  sinus tachycardia Patient with heart palpitations for the last week or more.  Shortness of breath and lightheadedness worsened this morning so she took her pulse rate which was in the 170's range and called her cardiologist who recommended she be evaluated in the emergency department.  EKG on arrival to the emergency department showed sinus tachycardia in the 130s.  Patient has been taking her metoprolol as prescribed.  She was given additional doses of Lopressor to slow her heart rate and repeat EKG was performed showing sinus tachycardia.  CT angiogram was completed and showed no PE.  BNP was normal at 20.6, troponins were trended and were 7> 11.  UA showed rare bacteria and small leukocytes.  The limits and creatinine was at baseline at 1.09.  Cardiology was consulted and they recommended admission with continued monitoring and an echocardiogram.  Unclear etiology of the cause of this tachycardia.  Patient appears euvolemic on exam but may be volume down.  Patient reports that she has taken her medications as prescribed but if there was medication noncompliance patient could also have hypertension.  No clear medication that she is on that could be causing the tachycardia. -Admit to observation with Dr. Lowella Bandy attending -Cardiology consulted, appreciate  recommendations -Repeat echocardiogram ordered -Continuous cardiac monitoring -2 L nasal cannula O2 at night (patient is on this at home) -Vitals per routine -PT/OT eval and treat -Continue gentle hydration with LR at 75 mL/h overnight, monitor fluid status  HFrEF with improved ejection fraction  pacemaker Patient with history of HFrEF.  Echo in 2018 showed LVEF of 30-35%.  Repeat echo in 2019 showed LVEF of 60 to 65%.  Home medications include Lasix 20 mg as needed for swelling, losartan 25 mg daily, Toprol-XL 50 mg twice daily, Aldactone 12.5 mg daily.  Patient appears euvolemic on exam. -Cardiology consulted, appreciate recommendations -We will continue losartan, Toprol-XL, Aldactone at this time -We will hold Lasix given patient's euvolemia -Per the ED provider cardiology wanted echocardiogram ordered, repeat echo ordered at this time -Continuous cardiac monitoring  T2DM Current medications include Metformin 500 mg twice daily although it is prescribed 1000 mg twice daily.  Hemoglobin A1c on 07/15/2020 was 7.2. -Holding Metformin while hospitalized -Sensitive sliding scale insulin -Heart healthy carb modified diet  HTN Range from 102/84-1 45/69 with most recent being 119/66.  Home medications include metoprolol XL 50 mg twice daily, losartan 25 mg daily, Aldactone 12.5 mg daily. -Continue home medications -Vitals per routine  HLD Patient with history of hyperlipidemia.  Not currently on a statin.  Parkinson's Home medications include Sinemet 25-100 mg 3 times daily -Continue home medications  Asthma Home medications include Xopenex, Symbicort. -Continue controller medication, Breo Ellipta because it is on formulary -Monitor respiratory status and patient can have albuterol if needed  FEN/GI: Heart healthy carb modified Prophylaxis: Lovenox  Disposition: Admit to observation  History of Present Illness:  Veronica Shaw is a 70 y.o. female presenting with  heart  palpitations and jaw pain.  She has also had shortness of breath and has been using her inhalers without much relief.  Patient reports that over the past few weeks she has noticed increased heart palpitations but she figured that her defibrillator would fix what ever issue was causing the heart palpitations.  She reports that for the past few days she has been having considerable jaw pain she was extremely painful yesterday.  Today she still had considerable jaw pain and noticed that her shortness of breath and lightheadedness was worse so she took her heart rate which was in the 170s.  She called her cardiologist who recommended she be evaluated in the emergency department.  Review Of Systems: Per HPI with the following additions:   Review of Systems  Constitutional: Positive for fatigue. Negative for fever.  HENT: Positive for congestion and rhinorrhea. Negative for dental problem and drooling.   Eyes: Positive for photophobia and pain.       S/p cataract surgery last week  Respiratory: Positive for chest tightness and shortness of breath. Negative for cough and wheezing.   Cardiovascular: Positive for chest pain and palpitations. Negative for leg swelling.  Gastrointestinal: Negative for abdominal distention, abdominal pain and nausea.  Endocrine: Positive for polyuria. Negative for polydipsia.  Genitourinary: Negative for decreased urine volume, difficulty urinating and dysuria.  Musculoskeletal:       Patient reports considerable jaw pain  Skin: Negative for color change.  Neurological: Positive for dizziness, light-headedness and headaches. Negative for syncope and numbness.  Hematological: Negative for adenopathy.  Psychiatric/Behavioral: Negative for agitation and confusion.     Patient Active Problem List   Diagnosis Date Noted  . Tachycardia 10/17/2020  . Cervical dystonia 10/07/2020  . Advanced care planning/counseling discussion 08/21/2020  . Dementia without behavioral  disturbance ([place]) 08/21/2020  . Vision impairment, Right Eye 08/21/2020  . Chronic kidney disease, stage 3a ([place]) 08/18/2020  . Frequent urination 07/30/2020  . Chronic rhinitis 12/18/2019  . Chronic respiratory failure with hypoxia ([place]) 12/18/2019  . Hyperlipidemia associated with type 2 diabetes mellitus ([place]) 03/05/2019  . Falls frequently   . Osteopenia 07/10/2015  . Mild persistent asthma in adult without complication 24/58/0998  . NICM (nonischemic cardiomyopathy) ([place]) 04/23/2014  . Arthritis or polyarthritis, rheumatoid ([place]) 03/12/2014  . ICD (implantable cardioverter-defibrillator), dual, in situ 03/06/2014  . Heart Failure with Recovered Ejection Fraction, G2DD 05/31/2013  . Parkinson disease ([place]) 05/21/2013  . Vertigo 03/03/2013  . Gastroesophageal reflux disease without esophagitis 09/27/2012  . Trigger point with neck pain 04/12/2012  . 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  . Diabetic peripheral neuropathy ([place]) 12/09/2011  . Chronic urticaria 05/27/2011  . 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 05/20/2011  . Type II diabetes mellitus with complication ([place]) 33/82/5053  . Hypertension associated with diabetes ([place]) 09/28/2006    Past Medical History: Past Medical History:  Diagnosis Date  . Asthma   . Back pain 09/18/2019  . Cervical disc disorder with radiculopathy of cervical region 09/24/2018  . Chronic systolic CHF (congestive heart failure) ([place])    a. cMRI 4/15: EF 34% and findings - c/w NICM, normal RV size and function (RVEF 61%), Mild MR // b. Echo 2/15:  EF 30-35%, diff HK, ant-sept AK, Gr 2 DD, mild MR, trivial TR  //  c. Echo 5/17: EF 20-25%, severe diffuse HK, marked systolic dyssynchrony, grade 1 diastolic dysfunction, mild MR  //  d. RHC 5/17: Fick CO 2.9, RVSP 19, PASP 15, PW mean 2, low filing  pressures and preserved CO   . Cognitive impairment 10/19/2017   MOCA was administered with a score of 23/30  . Diabetes mellitus   . Diabetic peripheral neuropathy ([place])  12/09/2011  . Flu 10/17/2017  . Gastritis   . History of echocardiogram    Echo 6/18: EF 30-35, diffuse HK, grade 1 diastolic dysfunction, trivial MR, mild LAE, mild TR, no pericardial effusion  . History of nuclear stress test    Myoview 5/18: EF 49, no ischemia, inferoseptal defect c/w LBBB artifact (intermediate risk due to EF < 50).  [name] Kitchen HTN (hypertension)   . Hyperlipidemia   . Hyperlipidemia associated with type 2 diabetes mellitus ([place]) 03/05/2019  . Hypertension associated with diabetes ([place]) 09/28/2006   Qualifier: Diagnosis of  By: [name]    . Major depression 03/31/2013  . Melena 08/21/2020  . NICM (nonischemic cardiomyopathy) ([place])    a. Nuclear 5/13: Normal stress nuclear study. LV Ejection Fraction: 58%  //  b. LHC 10/14: Minor luminal irregularity in prox LAD, EF 35%   . Parkinson disease ([place])   . Plantar fasciitis   . Plantar fasciitis, bilateral 06/15/2012  . Rosacea 05/29/2009   Qualifier: Diagnosis of  By: [name] MD, Rodman Key    . Sensorineural hearing loss (SNHL), bilateral 01/04/2017  . Sleep apnea    was retested and no longer had it and so d/c CPAP  . Syncope   . Thoracic or lumbosacral neuritis or radiculitis, unspecified 07/03/2013  . Tinnitus, bilateral 01/04/2017  . Type II diabetes mellitus with complication ([place]) 0/03/6760   Diabetic eye exam done by [facility] Ophthalmology: Dr. Prudencio Burly on 11/08/13: no diabetic retinopathy. Repeat in 1 year    . Urticaria     Past Surgical History: Past Surgical History:  Procedure Laterality Date  . BREAST EXCISIONAL BIOPSY Left 1970   benign cyst  . BREAST SURGERY Left   . BUNIONECTOMY    . CARDIAC CATHETERIZATION N/A 12/16/2015   Procedure: Right Heart Cath;  Surgeon: Sherren Mocha, MD;  Location: [facility];  Service: Cardiovascular;  Laterality: N/A;  . CHOLECYSTECTOMY    . eye lid surgery Bilateral 05/09/2019  . IMPLANTABLE CARDIOVERTER DEFIBRILLATOR IMPLANT  11-25-13   MDT dual chamber ICD implanted by  [name] for primary prevention  . IMPLANTABLE CARDIOVERTER DEFIBRILLATOR IMPLANT N/A 11/25/2013   Procedure: IMPLANTABLE CARDIOVERTER DEFIBRILLATOR IMPLANT;  Surgeon: [name], MD;  Location: [facility]ATH LAB;  Service: Cardiovascular;  Laterality: N/A;  . LEFT AND RIGHT HEART CATHETERIZATION WITH CORONARY ANGIOGRAM N/A 05/31/2013   Procedure: LEFT AND RIGHT HEART CATHETERIZATION WITH CORONARY ANGIOGRAM;  Surgeon: [name], MD;  Location[facility] C[facility] LAB;  Service: Cardiovascular;  Laterality: N/A;  . TONSILLECTOMY    . TUBAL LIGATION      Social History: Social History   Tobacco Use  . Smoking status: Never Smoker  . Smokeless tobacco: Never Used  . Tobacco comment: exposed to smoke during child hood (parents)  Vaping Use  . Vaping Use: Never used  Substance Use Topics  . Alcohol use: No    Alcohol/week: 0.0 standard drinks  . Drug use: No   Additional social history:   Please also refer to relevant sections of EMR.  Family History: Family History  Problem Relation Age of Onset  . Coronary artery disease Father        Died age 63  . Heart attack Father   . Diabetes Father   . Coronary artery disease Mother  Died age 57  . Heart attack Mother   . Diabetes Mother   . Parkinson's disease Sister   . Heart disease Sister   . Hepatitis C Sister   . Diabetes Sister   . Diabetes Son 37       T1DM  . Healthy Daughter   . Diabetes Sister   . Heart disease Sister   . Diabetes Sister   . Heart disease Sister   . Breast cancer Neg Hx     Allergies and Medications: Allergies  Allergen Reactions  . Aspirin Swelling and Other (See Comments)    Other reaction(s): Other (See Comments) Causes nose bleeds *ONLY THE COATED ASA* Causes nose bleeds *ONLY THE COATED ASA*  . Penicillins Shortness Of Breath and Rash    Shortness of Breath - Throat felt like it was closing.   [name] [Conj Estrog-Medroxyprogest Ace] Shortness Of Breath    Throat swelling Throat  swelling  . Ace Inhibitors Cough  . Simvastatin Other (See Comments) and Rash    Muscle aches   No current facility-administered medications on file prior to encounter.   Current Outpatient Medications on File Prior to Encounter  Medication Sig Dispense Refill  . APPLE CIDER VINEGAR PO Take 1 capsule by mouth daily.    . Biotin 5000 MCG CAPS Take 1 tablet by mouth daily.     . carbidopa-levodopa (SINEMET IR) 25-100 MG tablet TAKE 1 TABLET BY MOUTH THREE TIMES DAILY 270 tablet 0  . famotidine (PEPCID) 20 MG tablet TAKE 1 TABLET TWICE DAILY 180 tablet 2  . fluticasone (FLONASE SENSIMIST) 27.5 MCG/SPRAY nasal spray Place 2 sprays into the nose daily. 10 g 2  . furosemide (LASIX) 40 MG tablet TAKE 1/2 TABLET EVERY DAY AS NEEDED FOR  FLUID/SWELLING 30 tablet 3  . levalbuterol (XOPENEX) 0.63 MG/3ML nebulizer solution USE 1 VIAL IN NEBULIZER EVERY 4 TO 6 HOURS AS NEEDED FOR WHEEZING AND FOR SHORTNESS OF BREATH 144 mL 11  . losartan (COZAAR) 25 MG tablet TAKE 1 TABLET EVERY DAY 90 tablet 3  . meclizine (ANTIVERT) 12.5 MG tablet Take 1 tablet (12.5 mg total) by mouth daily as needed for dizziness. Should not be long term. 10 tablet 0  . melatonin 5 MG TABS Take 5 mg by mouth at bedtime.    . metFORMIN (GLUCOPHAGE) 500 MG tablet Take 2 tablets (1,000 mg total) by mouth 2 (two) times daily with a meal. (Patient taking differently: Take 500 mg by mouth 2 (two) times daily with a meal.) 180 tablet 3  . metoprolol succinate (TOPROL-XL) 50 MG 24 hr tablet TAKE 1 TABLET TWICE DAILY 180 tablet 2  . potassium chloride (KLOR-CON) 10 MEQ tablet Take 10 mEq by mouth daily as needed (low potassium).    [name] Kitchen spironolactone (ALDACTONE) 25 MG tablet TAKE 1/2 TABLET EVERY DAY 45 tablet 3  . Ubiquinone (ULTRA COQ10 PO) Take 1 tablet by mouth 3 (three) times a week.    . budesonide-formoterol (SYMBICORT) 160-4.5 MCG/ACT inhaler Inhale 2 puffs into the lungs 2 (two) times daily. (Patient not taking: Reported on 10/17/2020) 1  Inhaler 0  . OXYGEN Inhale into the lungs at bedtime. Inhale 2L into lungs      Objective: BP 119/66 (BP Location: Right Arm)   Pulse (!) 108   Temp 98 F (36.7 C) (Oral)   Resp 16   LMP 08/01/2000 (Approximate)   SpO2 96%  Physical Exam Vitals and nursing note reviewed.  Constitutional:  General: She is not in acute distress.    Appearance: She is normal weight. She is not ill-appearing, toxic-appearing or diaphoretic.  HENT:     Head: Normocephalic and atraumatic.     Nose: Congestion and rhinorrhea present.     Mouth/Throat:     Mouth: Mucous membranes are moist.     Pharynx: No oropharyngeal exudate.  Eyes:     Conjunctiva/sclera: Conjunctivae normal.     Pupils: Pupils are equal, round, and reactive to light.     Comments: Left pupil more dilated than the right pupil which patient reports has been that way since having her cataract surgery  Cardiovascular:     Rate and Rhythm: Regular rhythm. Tachycardia present.     Pulses: Normal pulses.     Heart sounds: Normal heart sounds.  Pulmonary:     Effort: Pulmonary effort is normal. No respiratory distress.     Breath sounds: Normal breath sounds.  Abdominal:     General: Abdomen is flat. Bowel sounds are normal. There is no distension.     Palpations: Abdomen is soft.  Musculoskeletal:        General: No swelling or deformity.     Cervical back: Normal range of motion and neck supple. No rigidity or tenderness.  Skin:    General: Skin is warm and dry.     Capillary Refill: Capillary refill takes less than 2 seconds.     Coloration: Skin is not jaundiced.  Neurological:     General: No focal deficit present.     Mental Status: She is alert and oriented to person, place, and time. Mental status is at baseline.  Psychiatric:        Mood and Affect: Mood normal.        Behavior: Behavior normal.     Labs and Imaging: CBC BMET  Recent Labs  Lab 10/17/20 1156  WBC 11.0*  HGB 13.2  HCT 39.0  PLT 230    Recent Labs  Lab 10/17/20 1156  NA 137  K 4.4  CL 104  CO2 22  BUN 19  CREATININE 1.09*  GLUCOSE 160*  CALCIUM 9.8    Troponin 7> 11 BNP 20.6 UA -Hazy, small leukocytes, specific gravity of 1.004, rare bacteria  EKG: Sinus tachycardia  DG Chest 2 View  Result Date: 10/17/2020 CLINICAL DATA:  Chest pain EXAM: CHEST - 2 VIEW COMPARISON:  Chest radiograph dated 06/15/2020 FINDINGS: The heart size and mediastinal contours are within normal limits. Both lungs are clear. Degenerative changes are seen in the spine. A left subclavian approach cardiac device is redemonstrated. IMPRESSION: No active cardiopulmonary disease. Electronically Signed   By: Zerita Boers M.D.   On: 10/17/2020 13:01   CT Angio Chest PE W/Cm &/Or Wo Cm  Result Date: 10/17/2020 CLINICAL DATA:  Cardiac palpitations with pain in the jaw, dizziness, and headache EXAM: CT ANGIOGRAPHY CHEST WITH CONTRAST TECHNIQUE: Multidetector CT imaging of the chest was performed using the standard protocol during bolus administration of intravenous contrast. Multiplanar CT image reconstructions and MIPs were obtained to evaluate the vascular anatomy. CONTRAST:  25mL OMNIPAQUE IOHEXOL 350 MG/ML SOLN COMPARISON:  Chest radiograph 10/17/2020 and CT chest from 04/20/2013 FINDINGS: Cardiovascular: No filling defect is identified in the pulmonary arterial tree to suggest pulmonary embolus. Atherosclerotic calcification of the aortic arch and right subclavian artery. Aberrant right subclavian artery passes behind the esophagus, which can cause dysphagia lusoria. AICD noted. Mediastinum/Nodes: Unremarkable Lungs/Pleura: Mild right apical scarring versus atelectasis. Mild scarring  or atelectasis in the posterior basal segments of both lower lobes. Left lower lobe granuloma on image 70 series 7 common no change from 2014. Upper Abdomen: Cholecystectomy. Musculoskeletal: Thoracic spondylosis. Review of the MIP images confirms the above findings.  IMPRESSION: 1. No filling defect is identified in the pulmonary arterial tree to suggest pulmonary embolus. 2. Aberrant right subclavian artery passes behind the esophagus, which can cause dysphagia lusoria. 3. Mild scarring or atelectasis in the posterior basal segments of both lower lobes. 4. Aortic atherosclerosis. Aortic Atherosclerosis (ICD10-I70.0). Electronically Signed   By: Van Clines M.D.   On: 10/17/2020 18:35     Gifford Shave, MD 10/17/2020, 9:13 PM PGY-2, [facility] pager: [phone], text pages welcome

## 2020-10-17 NOTE — ED Notes (Signed)
Pt returned from ct

## 2020-10-17 NOTE — ED Notes (Signed)
Asking for water given per edp

## 2020-10-18 ENCOUNTER — Observation Stay (HOSPITAL_COMMUNITY): Payer: Medicare Other

## 2020-10-18 DIAGNOSIS — I361 Nonrheumatic tricuspid (valve) insufficiency: Secondary | ICD-10-CM | POA: Diagnosis not present

## 2020-10-18 DIAGNOSIS — Z95810 Presence of automatic (implantable) cardiac defibrillator: Secondary | ICD-10-CM | POA: Diagnosis not present

## 2020-10-18 DIAGNOSIS — H547 Unspecified visual loss: Secondary | ICD-10-CM | POA: Diagnosis present

## 2020-10-18 DIAGNOSIS — G249 Dystonia, unspecified: Secondary | ICD-10-CM | POA: Diagnosis present

## 2020-10-18 DIAGNOSIS — R9431 Abnormal electrocardiogram [ECG] [EKG]: Secondary | ICD-10-CM | POA: Diagnosis not present

## 2020-10-18 DIAGNOSIS — M069 Rheumatoid arthritis, unspecified: Secondary | ICD-10-CM | POA: Diagnosis present

## 2020-10-18 DIAGNOSIS — I471 Supraventricular tachycardia: Secondary | ICD-10-CM | POA: Diagnosis present

## 2020-10-18 DIAGNOSIS — E1122 Type 2 diabetes mellitus with diabetic chronic kidney disease: Secondary | ICD-10-CM | POA: Diagnosis present

## 2020-10-18 DIAGNOSIS — I5043 Acute on chronic combined systolic (congestive) and diastolic (congestive) heart failure: Secondary | ICD-10-CM | POA: Diagnosis present

## 2020-10-18 DIAGNOSIS — I5023 Acute on chronic systolic (congestive) heart failure: Secondary | ICD-10-CM | POA: Diagnosis not present

## 2020-10-18 DIAGNOSIS — R000 Tachycardia, unspecified: Secondary | ICD-10-CM

## 2020-10-18 DIAGNOSIS — I5022 Chronic systolic (congestive) heart failure: Secondary | ICD-10-CM

## 2020-10-18 DIAGNOSIS — N1831 Chronic kidney disease, stage 3a: Secondary | ICD-10-CM | POA: Diagnosis present

## 2020-10-18 DIAGNOSIS — F329 Major depressive disorder, single episode, unspecified: Secondary | ICD-10-CM | POA: Diagnosis present

## 2020-10-18 DIAGNOSIS — M4722 Other spondylosis with radiculopathy, cervical region: Secondary | ICD-10-CM | POA: Diagnosis present

## 2020-10-18 DIAGNOSIS — M5010 Cervical disc disorder with radiculopathy, unspecified cervical region: Secondary | ICD-10-CM | POA: Diagnosis present

## 2020-10-18 DIAGNOSIS — E1142 Type 2 diabetes mellitus with diabetic polyneuropathy: Secondary | ICD-10-CM | POA: Diagnosis present

## 2020-10-18 DIAGNOSIS — R002 Palpitations: Secondary | ICD-10-CM | POA: Diagnosis present

## 2020-10-18 DIAGNOSIS — I152 Hypertension secondary to endocrine disorders: Secondary | ICD-10-CM | POA: Diagnosis present

## 2020-10-18 DIAGNOSIS — I130 Hypertensive heart and chronic kidney disease with heart failure and stage 1 through stage 4 chronic kidney disease, or unspecified chronic kidney disease: Secondary | ICD-10-CM | POA: Diagnosis present

## 2020-10-18 DIAGNOSIS — G4730 Sleep apnea, unspecified: Secondary | ICD-10-CM | POA: Diagnosis present

## 2020-10-18 DIAGNOSIS — Z20822 Contact with and (suspected) exposure to covid-19: Secondary | ICD-10-CM | POA: Diagnosis present

## 2020-10-18 DIAGNOSIS — M722 Plantar fascial fibromatosis: Secondary | ICD-10-CM | POA: Diagnosis present

## 2020-10-18 DIAGNOSIS — E785 Hyperlipidemia, unspecified: Secondary | ICD-10-CM | POA: Diagnosis present

## 2020-10-18 DIAGNOSIS — H903 Sensorineural hearing loss, bilateral: Secondary | ICD-10-CM | POA: Diagnosis present

## 2020-10-18 DIAGNOSIS — R0600 Dyspnea, unspecified: Secondary | ICD-10-CM | POA: Diagnosis not present

## 2020-10-18 DIAGNOSIS — R6884 Jaw pain: Secondary | ICD-10-CM | POA: Diagnosis present

## 2020-10-18 DIAGNOSIS — E1169 Type 2 diabetes mellitus with other specified complication: Secondary | ICD-10-CM | POA: Diagnosis present

## 2020-10-18 DIAGNOSIS — I428 Other cardiomyopathies: Secondary | ICD-10-CM | POA: Diagnosis present

## 2020-10-18 DIAGNOSIS — G20 Parkinson's disease: Secondary | ICD-10-CM | POA: Diagnosis present

## 2020-10-18 DIAGNOSIS — F0280 Dementia in other diseases classified elsewhere without behavioral disturbance: Secondary | ICD-10-CM | POA: Diagnosis present

## 2020-10-18 LAB — GLUCOSE, CAPILLARY
Glucose-Capillary: 129 mg/dL — ABNORMAL HIGH (ref 70–99)
Glucose-Capillary: 204 mg/dL — ABNORMAL HIGH (ref 70–99)
Glucose-Capillary: 141 mg/dL — ABNORMAL HIGH (ref 70–99)
Glucose-Capillary: 180 mg/dL — ABNORMAL HIGH (ref 70–99)

## 2020-10-18 LAB — BASIC METABOLIC PANEL
Anion gap: 10 (ref 5–15)
BUN: 19 mg/dL (ref 8–23)
CO2: 24 mmol/L (ref 22–32)
Calcium: 10 mg/dL (ref 8.9–10.3)
Chloride: 102 mmol/L (ref 98–111)
Creatinine, Ser: 1.04 mg/dL — ABNORMAL HIGH (ref 0.44–1.00)
GFR, Estimated: 57 mL/min — ABNORMAL LOW (ref >60)
Glucose, Bld: 114 mg/dL — ABNORMAL HIGH (ref 70–99)
Potassium: 4 mmol/L (ref 3.5–5.1)
Sodium: 136 mmol/L (ref 135–145)

## 2020-10-18 LAB — ECHOCARDIOGRAM COMPLETE
AR max vel: 2.6 cm2
AV Area VTI: 3.39 cm2
AV Area mean vel: 2.83 cm2
AV Mean grad: 1 mmHg
AV Peak grad: 3.1 mmHg
Ao pk vel: 0.88 m/s
Area-P 1/2: 3.46 cm2
Height: 61.5 in
S' Lateral: 3.1 cm
Weight: 2081.14 oz

## 2020-10-18 NOTE — ED Notes (Signed)
Given to Floor, [name], [place]

## 2020-10-18 NOTE — Consult Note (Signed)
Cardiology Consultation:   Patient ID: Veronica Shaw MRN: [id number]; DOB: [birth year]  Admit date: 10/17/2020 Date of Consult: 10/18/2020  PCP:  [name] DO   [facility]  Cardiologist:  Sherren Mocha, MD  Advanced Practice Provider:  No care team member to display Electrophysiologist:  Cristopher Peru, MD    Patient Profile:   Veronica Shaw is a 70 y.o. female with a hx of chronic systolic heart failure  who is being seen today for the evaluation of atrial tachycardia at the request of Dr. Neena Rhymes.  History of Present Illness:   Veronica Shaw is well known to me.  She is a very pleasant 70 year old woman with an ischemic cardiomyopathy, chronic systolic heart failure, COPD, and left bundle branch block.  She does have Parkinson's.  She has a history of palpitations and presented to the hospital with worsening heart failure symptoms.  She was noted to have heart rates in the 130 range.  It was unclear as to whether or not she was in sinus tachycardia or atrial tachycardia.  The patient has a dual-chamber ICD.  She has had no recent ICD shocks.  Since being hospitalized, she has had a marked improvement with intravenous diuretic therapy.  Her dyspnea is almost back to baseline.  She denies any recent ICD shocks.   Past Medical History:  Diagnosis Date  . Asthma   . Back pain 09/18/2019  . Cervical disc disorder with radiculopathy of cervical region 09/24/2018  . Chronic systolic CHF (congestive heart failure) ([place])    a. cMRI 4/15: EF 34% and findings - c/w NICM, normal RV size and function (RVEF 61%), Mild MR // b. Echo 2/15:  EF 30-35%, diff HK, ant-sept AK, Gr 2 DD, mild MR, trivial TR  //  c. Echo 5/17: EF 20-25%, severe diffuse HK, marked systolic dyssynchrony, grade 1 diastolic dysfunction, mild MR  //  d. RHC 5/17: Fick CO 2.9, RVSP 19, PASP 15, PW mean 2, low filing pressures and preserved CO   . Cognitive impairment 10/19/2017   MOCA  was administered with a score of 23/30  . Diabetes mellitus   . Diabetic peripheral neuropathy ([place]) 12/09/2011  . Flu 10/17/2017  . Gastritis   . History of echocardiogram    Echo 6/18: EF 30-35, diffuse HK, grade 1 diastolic dysfunction, trivial MR, mild LAE, mild TR, no pericardial effusion  . History of nuclear stress test    Myoview 5/18: EF 49, no ischemia, inferoseptal defect c/w LBBB artifact (intermediate risk due to EF < 50).  [name] Kitchen HTN (hypertension)   . Hyperlipidemia   . Hyperlipidemia associated with type 2 diabetes mellitus ([place]) 03/05/2019  . Hypertension associated with diabetes ([place]) 09/28/2006   Qualifier: Diagnosis of  By: [name]    . Major depression 03/31/2013  . Melena 08/21/2020  . NICM (nonischemic cardiomyopathy) ([place])    a. Nuclear 5/13: Normal stress nuclear study. LV Ejection Fraction: 58%  //  b. LHC 10/14: Minor luminal irregularity in prox LAD, EF 35%   . Parkinson disease ([place])   . Plantar fasciitis   . Plantar fasciitis, bilateral 06/15/2012  . Rosacea 05/29/2009   Qualifier: Diagnosis of  By: [name] MD, Rodman Key    . Sensorineural hearing loss (SNHL), bilateral 01/04/2017  . Sleep apnea    was retested and no longer had it and so d/c CPAP  . Syncope   . Thoracic or lumbosacral neuritis or radiculitis, unspecified 07/03/2013  . Tinnitus,  bilateral 01/04/2017  . Type II diabetes mellitus with complication ([place]) 6/94/8546   Diabetic eye exam done by [facility]: Dr. Prudencio Burly on 11/08/13: no diabetic retinopathy. Repeat in 1 year    . Urticaria     Past Surgical History:  Procedure Laterality Date  . BREAST EXCISIONAL BIOPSY Left 1970   benign cyst  . BREAST SURGERY Left   . BUNIONECTOMY    . CARDIAC CATHETERIZATION N/A 12/16/2015   Procedure: Right Heart Cath;  Surgeon: Sherren Mocha, MD;  Location: [facility] [facility];  Service: Cardiovascular;  Laterality: N/A;  . CHOLECYSTECTOMY    . eye lid surgery Bilateral 05/09/2019  . IMPLANTABLE  CARDIOVERTER DEFIBRILLATOR IMPLANT  11-25-13   MDT dual chamber ICD implanted by [name] for primary prevention  . IMPLANTABLE CARDIOVERTER DEFIBRILLATOR IMPLANT N/A 11/25/2013   Procedure: IMPLANTABLE CARDIOVERTER DEFIBRILLATOR IMPLANT;  Surgeon: [name], MD;  Location: [facility] CATH LAB;  Service: Cardiovascular;  Laterality: N/A;  . LEFT AND RIGHT HEART CATHETERIZATION WITH CORONARY ANGIOGRAM N/A 05/31/2013   Procedure: LEFT AND RIGHT HEART CATHETERIZATION WITH CORONARY ANGIOGRAM;  Surgeon: [name], MD;  Location: [facility] CATH LAB;  Service: Cardiovascular;  Laterality: N/A;  . TONSILLECTOMY    . TUBAL LIGATION       Home Medications:  Prior to Admission medications   Medication Sig Start Date End Date Taking? Authorizing Provider  APPLE CIDER VINEGAR PO Take 1 capsule by mouth daily.   Yes [provider]  Biotin 5000 MCG CAPS Take 1 tablet by mouth daily.    Yes [provider]  carbidopa-levodopa (SINEMET IR) 25-100 MG tablet TAKE 1 TABLET BY MOUTH THREE TIMES DAILY 09/28/20  Yes Tat, Eustace Quail, DO  famotidine (PEPCID) 20 MG tablet TAKE 1 TABLET TWICE DAILY 06/17/19  Yes Tammi Klippel, Sherin, DO  fluticasone (FLONASE SENSIMIST) 27.5 MCG/SPRAY nasal spray Place 2 sprays into the nose daily. 08/20/20  Yes McDiarmid, [name], MD  furosemide (LASIX) 40 MG tablet TAKE 1/2 TABLET EVERY DAY AS NEEDED FOR  FLUID/SWELLING 03/25/20  Yes Beard, Samantha N, DO  levalbuterol (XOPENEX) 0.63 MG/3ML nebulizer solution USE 1 VIAL IN NEBULIZER EVERY 4 TO 6 HOURS AS NEEDED FOR WHEEZING AND FOR SHORTNESS OF BREATH 10/14/20  Yes Mannam, Praveen, MD  losartan (COZAAR) 25 MG tablet TAKE 1 TABLET EVERY DAY 04/01/20  Yes Sherren Mocha, MD  meclizine (ANTIVERT) 12.5 MG tablet Take 1 tablet (12.5 mg total) by mouth daily as needed for dizziness. Should not be long term. 10/02/20  Yes Beard, Samantha N, DO  melatonin 5 MG TABS Take 5 mg by mouth at bedtime.   Yes [provider]  metFORMIN  (GLUCOPHAGE) 500 MG tablet Take 2 tablets (1,000 mg total) by mouth 2 (two) times daily with a meal. Patient taking differently: Take 500 mg by mouth 2 (two) times daily with a meal. 06/24/20  Yes Darrelyn Hillock N, DO  metoprolol succinate (TOPROL-XL) 50 MG 24 hr tablet TAKE 1 TABLET TWICE DAILY 06/05/20  Yes [name], MD  potassium chloride (KLOR-CON) 10 MEQ tablet Take 10 mEq by mouth daily as needed (low potassium).   Yes [provider]  spironolactone (ALDACTONE) 25 MG tablet TAKE 1/2 TABLET EVERY DAY 04/01/20  Yes Sherren Mocha, MD  Ubiquinone (ULTRA COQ10 PO) Take 1 tablet by mouth 3 (three) times a week.   Yes [provider]  budesonide-formoterol (SYMBICORT) 160-4.5 MCG/ACT inhaler Inhale 2 puffs into the lungs 2 (two) times daily. Patient not taking:  Reported on 10/17/2020 10/24/19   Lauraine Rinne, NP  OXYGEN Inhale into the lungs at bedtime. Inhale 2L into lungs    [provider]    Inpatient Medications: Scheduled Meds: . carbidopa-levodopa  1 tablet Oral TID  . enoxaparin (LOVENOX) injection  40 mg Subcutaneous QHS  . famotidine  20 mg Oral BID  . fluticasone furoate-vilanterol  1 puff Inhalation Daily  . insulin aspart  0-9 Units Subcutaneous TID WC  . losartan  25 mg Oral Daily  . melatonin  5 mg Oral QHS  . metoprolol succinate  50 mg Oral BID  . spironolactone  12.5 mg Oral Daily   Continuous Infusions: . lactated ringers Stopped (10/18/20 1100)   PRN Meds: acetaminophen, ondansetron (ZOFRAN) IV  Allergies:    Allergies  Allergen Reactions  . Aspirin Swelling and Other (See Comments)    Other reaction(s): Other (See Comments) Causes nose bleeds *ONLY THE COATED ASA* Causes nose bleeds *ONLY THE COATED ASA*  . Penicillins Shortness Of Breath and Rash    Shortness of Breath - Throat felt like it was closing.   [name] [Conj Estrog-Medroxyprogest Ace] Shortness Of Breath    Throat swelling Throat swelling  . Ace Inhibitors  Cough  . Simvastatin Other (See Comments) and Rash    Muscle aches    Social History:   Social History   Socioeconomic History  . Marital status: Married    Spouse name: [name]   . Number of children: 2  . Years of education: 43  . Highest education level: 12th grade  Occupational History  . Occupation: retired    Comment: CNA  Tobacco Use  . Smoking status: Never Smoker  . Smokeless tobacco: Never Used  . Tobacco comment: exposed to smoke during child hood (parents)  Vaping Use  . Vaping Use: Never used  Substance and Sexual Activity  . Alcohol use: No    Alcohol/week: 0.0 standard drinks  . Drug use: No  . Sexual activity: Yes    Birth control/protection: Post-menopausal  Other Topics Concern  . Not on file  Social History Narrative   Patient lives with her husband [name].   Patient enjoys spending time with her family, cooking, and her 2 dogs.    Patient has one daughter who lives locally, and one son who lives in [place].   Social Determinants of Health   Financial Resource Strain: Low Risk   . Difficulty of Paying Living Expenses: Not hard at all  Food Insecurity: No Food Insecurity  . Worried About Charity fundraiser in the Last Year: Never true  . Ran Out of Food in the Last Year: Never true  Transportation Needs: No Transportation Needs  . Lack of Transportation (Medical): No  . Lack of Transportation (Non-Medical): No  Physical Activity: Sufficiently Active  . Days of Exercise per Week: 7 days  . Minutes of Exercise per Session: 30 min  Stress: No Stress Concern Present  . Feeling of Stress : Not at all  Social Connections: Moderately Integrated  . Frequency of Communication with Friends and Family: More than three times a week  . Frequency of Social Gatherings with Friends and Family: More than three times a week  . Attends Religious Services: More than 4 times per year  . Active Member of Clubs or Organizations: No  . Attends Archivist  Meetings: Never  . Marital Status: Married  Human resources officer Violence: Not At Risk  . Fear of Current or  Ex-Partner: No  . Emotionally Abused: No  . Physically Abused: No  . Sexually Abused: No    Family History:    Family History  Problem Relation Age of Onset  . Coronary artery disease Father        Died age 59  . Heart attack Father   . Diabetes Father   . Coronary artery disease Mother        Died age 65  . Heart attack Mother   . Diabetes Mother   . Parkinson's disease Sister   . Heart disease Sister   . Hepatitis C Sister   . Diabetes Sister   . Diabetes Son 37       T1DM  . Healthy Daughter   . Diabetes Sister   . Heart disease Sister   . Diabetes Sister   . Heart disease Sister   . Breast cancer Neg Hx      ROS:  Please see the history of present illness.   All other ROS reviewed and negative.     Physical Exam/Data:   Vitals:   10/18/20 0451 10/18/20 0809 10/18/20 0822 10/18/20 0900  BP: (!) 109/54   (!) 122/59  Pulse: 88  83 83  Resp: 18   18  Temp: 98.3 F (36.8 C)   98 F (36.7 C)  TempSrc: Oral   Oral  SpO2: 99% 96%    Weight:      Height:        Intake/Output Summary (Last 24 hours) at 10/18/2020 1301 Last data filed at 10/18/2020 0300 Gross per 24 hour  Intake 360 ml  Output --  Net 360 ml   Last 3 Weights 10/18/2020 10/07/2020 09/15/2020  Weight (lbs) 130 lb 1.1 oz 137 lb 134 lb 12.8 oz  Weight (kg) 59 kg 62.143 kg 61.145 kg     Body mass index is 24.18 kg/m.  General:  Well nourished, well developed, in no acute distress HEENT: normal Lymph: no adenopathy Neck: no JVD Endocrine:  No thryomegaly Vascular: No carotid bruits; FA pulses 2+ bilaterally without bruits  Cardiac:  normal S1, split S2; RRR; no murmur  Lungs:  clear to auscultation bilaterally, no wheezing, rhonchi or rales  Abd: soft, nontender, no hepatomegaly  Ext: no edema Musculoskeletal:  No deformities, BUE and BLE strength normal and equal Skin: warm and dry   Neuro:  CNs 2-12 intact, no focal abnormalities noted Psych:  Normal affect   EKG:  The EKG was personally reviewed and demonstrates: Sinus tach versus atrial tachycardia with left bundle branch block Telemetry:  Telemetry was personally reviewed and demonstrates: Sinus tach as well as normal sinus rhythm  Relevant CV Studies: 2D echo reviewed  Laboratory Data:  High Sensitivity Troponin:   Recent Labs  Lab 10/17/20 1156 10/17/20 1402  TROPONINIHS 7 11     Chemistry Recent Labs  Lab 10/17/20 1156 10/18/20 0222  NA 137 136  K 4.4 4.0  CL 104 102  CO2 22 24  GLUCOSE 160* 114*  BUN 19 19  CREATININE 1.09* 1.04*  CALCIUM 9.8 10.0  GFRNONAA 54* 57*  ANIONGAP 11 10    Recent Labs  Lab 10/17/20 1402  PROT 7.5  ALBUMIN 3.9  AST 22  ALT 7  ALKPHOS 96  BILITOT 0.8   Hematology Recent Labs  Lab 10/17/20 1156  WBC 11.0*  RBC 4.31  HGB 13.2  HCT 39.0  MCV 90.5  MCH 30.6  MCHC 33.8  RDW 11.9  PLT 230  BNP Recent Labs  Lab 10/17/20 1156  BNP 20.6    DDimer No results for input(s): DDIMER in the last 168 hours.   Radiology/Studies:  DG Chest 2 View  Result Date: 10/17/2020 CLINICAL DATA:  Chest pain EXAM: CHEST - 2 VIEW COMPARISON:  Chest radiograph dated 06/15/2020 FINDINGS: The heart size and mediastinal contours are within normal limits. Both lungs are clear. Degenerative changes are seen in the spine. A left subclavian approach cardiac device is redemonstrated. IMPRESSION: No active cardiopulmonary disease. Electronically Signed   By: Zerita Boers M.D.   On: 10/17/2020 13:01   CT Angio Chest PE W/Cm &/Or Wo Cm  Result Date: 10/17/2020 CLINICAL DATA:  Cardiac palpitations with pain in the jaw, dizziness, and headache EXAM: CT ANGIOGRAPHY CHEST WITH CONTRAST TECHNIQUE: Multidetector CT imaging of the chest was performed using the standard protocol during bolus administration of intravenous contrast. Multiplanar CT image reconstructions and MIPs were  obtained to evaluate the vascular anatomy. CONTRAST:  39mL OMNIPAQUE IOHEXOL 350 MG/ML SOLN COMPARISON:  Chest radiograph 10/17/2020 and CT chest from 04/20/2013 FINDINGS: Cardiovascular: No filling defect is identified in the pulmonary arterial tree to suggest pulmonary embolus. Atherosclerotic calcification of the aortic arch and right subclavian artery. Aberrant right subclavian artery passes behind the esophagus, which can cause dysphagia lusoria. AICD noted. Mediastinum/Nodes: Unremarkable Lungs/Pleura: Mild right apical scarring versus atelectasis. Mild scarring or atelectasis in the posterior basal segments of both lower lobes. Left lower lobe granuloma on image 70 series 7 common no change from 2014. Upper Abdomen: Cholecystectomy. Musculoskeletal: Thoracic spondylosis. Review of the MIP images confirms the above findings. IMPRESSION: 1. No filling defect is identified in the pulmonary arterial tree to suggest pulmonary embolus. 2. Aberrant right subclavian artery passes behind the esophagus, which can cause dysphagia lusoria. 3. Mild scarring or atelectasis in the posterior basal segments of both lower lobes. 4. Aortic atherosclerosis. Aortic Atherosclerosis (ICD10-I70.0). Electronically Signed   By: Van Clines M.D.   On: 10/17/2020 18:35   ECHOCARDIOGRAM COMPLETE  Result Date: 10/18/2020    ECHOCARDIOGRAM REPORT   Patient Name:   Veronica Shaw Date of Exam: 10/18/2020 Medical Rec #:  [id number]          Height:       61.5 in Accession #:    [id number]         Weight:       130.1 lb Date of Birth:  September 02, 1950           BSA:          1.583 m Patient Age:    33 years           BP:           122/59 mmHg Patient Gender: F                  HR:           76 bpm. Exam Location:  Inpatient Procedure: 2D Echo, Cardiac Doppler and Color Doppler Indications:    Abnormal ECG  History:        Patient has prior history of Echocardiogram examinations, most                 recent 10/18/2017. CHF,  Defibrillator, Signs/Symptoms:Shortness                 of Breath; Risk Factors:Hypertension, Diabetes and Dyslipidemia.                 NICM.  Sonographer:    Clayton Lefort RDCS (AE) Referring Phys: Laura  1. Left ventricular ejection fraction, by estimation, is 40 to 45%. The left ventricle has mildly decreased function. The left ventricle demonstrates global hypokinesis. Left ventricular diastolic function could not be evaluated.  2. Right ventricular systolic function is normal. The right ventricular size is normal. There is normal pulmonary artery systolic pressure. The estimated right ventricular systolic pressure is 06.3 mmHg.  3. The mitral valve is normal in structure. No evidence of mitral valve regurgitation. No evidence of mitral stenosis.  4. The aortic valve is normal in structure. Aortic valve regurgitation is not visualized. No aortic stenosis is present.  5. The inferior vena cava is normal in size with greater than 50% respiratory variability, suggesting right atrial pressure of 3 mmHg. FINDINGS  Left Ventricle: Left ventricular ejection fraction, by estimation, is 40 to 45%. The left ventricle has mildly decreased function. The left ventricle demonstrates global hypokinesis. The left ventricular internal cavity size was normal in size. There is  no left ventricular hypertrophy. Abnormal (paradoxical) septal motion, consistent with RV pacemaker. Left ventricular diastolic function could not be evaluated due to paced rhythm. Left ventricular diastolic function could not be evaluated. Normal left ventricular filling pressure. Right Ventricle: The right ventricular size is normal. No increase in right ventricular wall thickness. Right ventricular systolic function is normal. There is normal pulmonary artery systolic pressure. The tricuspid regurgitant velocity is 2.18 m/s, and  with an assumed right atrial pressure of 3 mmHg, the estimated right ventricular systolic pressure  is 01.6 mmHg. Left Atrium: Left atrial size was normal in size. Right Atrium: Right atrial size was normal in size. Pericardium: There is no evidence of pericardial effusion. Mitral Valve: The mitral valve is normal in structure. No evidence of mitral valve regurgitation. No evidence of mitral valve stenosis. Tricuspid Valve: The tricuspid valve is normal in structure. Tricuspid valve regurgitation is mild . No evidence of tricuspid stenosis. Aortic Valve: The aortic valve is normal in structure. Aortic valve regurgitation is not visualized. No aortic stenosis is present. Aortic valve mean gradient measures 1.0 mmHg. Aortic valve peak gradient measures 3.1 mmHg. Aortic valve area, by VTI measures 3.39 cm. Pulmonic Valve: The pulmonic valve was normal in structure. Pulmonic valve regurgitation is not visualized. No evidence of pulmonic stenosis. Aorta: The aortic root is normal in size and structure. Venous: The inferior vena cava is normal in size with greater than 50% respiratory variability, suggesting right atrial pressure of 3 mmHg. IAS/Shunts: No atrial level shunt detected by color flow Doppler. Additional Comments: A device lead is visualized.  LEFT VENTRICLE PLAX 2D LVIDd:         4.20 cm  Diastology LVIDs:         3.10 cm  LV e' medial:    5.44 cm/s LV PW:         1.00 cm  LV E/e' medial:  11.0 LV IVS:        0.80 cm  LV e' lateral:   6.74 cm/s LVOT diam:     2.20 cm  LV E/e' lateral: 8.9 LV SV:         54 LV SV Index:   34 LVOT Area:     3.80 cm  RIGHT VENTRICLE             IVC RV Basal diam:  2.30 cm     IVC diam: 2.00 cm RV S prime:  11.70 cm/s TAPSE (M-mode): 1.9 cm LEFT ATRIUM             Index       RIGHT ATRIUM          Index LA diam:        1.50 cm 0.95 cm/m  RA Area:     9.49 cm LA Vol (A2C):   22.8 ml 14.41 ml/m RA Volume:   16.60 ml 10.49 ml/m LA Vol (A4C):   28.7 ml 18.13 ml/m LA Biplane Vol: 27.5 ml 17.38 ml/m  AORTIC VALVE AV Area (Vmax):    2.60 cm AV Area (Vmean):   2.83 cm AV  Area (VTI):     3.39 cm AV Vmax:           87.90 cm/s AV Vmean:          57.300 cm/s AV VTI:            0.159 m AV Peak Grad:      3.1 mmHg AV Mean Grad:      1.0 mmHg LVOT Vmax:         60.10 cm/s LVOT Vmean:        42.600 cm/s LVOT VTI:          0.142 m LVOT/AV VTI ratio: 0.89  AORTA Ao Root diam: 3.10 cm Ao Asc diam:  3.00 cm MITRAL VALVE               TRICUSPID VALVE MV Area (PHT): 3.46 cm    TR Peak grad:   19.0 mmHg MV Decel Time: 219 msec    TR Vmax:        218.00 cm/s MV E velocity: 60.00 cm/s MV A velocity: 90.00 cm/s  SHUNTS MV E/A ratio:  0.67        Systemic VTI:  0.14 m                            Systemic Diam: 2.20 cm Fransico Him MD Electronically signed by Fransico Him MD Signature Date/Time: 10/18/2020/12:38:48 PM    Final      Assessment and Plan:   1. Sinus versus atrial tachycardia -the patient is back in sinus rhythm.  Her heart rates in the ER were in the 130 range.  She was given intravenous metoprolol with improvement.  Review of her telemetry suggest sinus tachycardia as there is no sudden stopping and starting of the fast heartbeats.  At this point, we do not know which caused which in terms of her heart failure and her tachycardia.  She is improved and I would recommend watchful waiting for now and continued beta-blocker therapy. 2. Acute on chronic systolic heart failure -the patient's symptoms are much improved and she has had a nice diuresis.  Would continue intravenous diuretic today transitioning to oral diuretic and hopefully discharge tomorrow. 3. ICD -we will recheck her dual-chamber ICD and look at the histograms in more detail.     For questions or updates, please contact [place] Please consult [email or web address] for contact info under    Signed, Cristopher Peru, MD  10/18/2020 1:01 PM

## 2020-10-18 NOTE — Progress Notes (Signed)
Family Medicine Teaching Service Daily Progress Note Intern Pager: [phone]  Patient name: Veronica Shaw Medical record number: [id number] Date of birth: 1951-04-05 Age: 70 y.o. Gender: female  Primary Care Provider: [name], DO Consultants: Cardiology  Code Status: Full code  Pt Overview and Major Events to Date:  Admitted 10/17/2020 for tachycardia  Assessment and Plan: Veronica Shaw is a 70 y.o. female presenting with heart palpitations and jaw pain. PMH is significant for HFrEF, HTN, HLD, Parkinson's, asthma  Tachycardia  palpitations Patient did well overnight.  Cardiology saw the patient and echocardiogram is pending.  They will arrange interrogation of her pacemaker today.  Heart rate is a regular rate at this time in the 80s and patient is normotensive. -Cardiology consulted, appreciate recommendations -Follow-up on echocardiogram -EP consult after pacemaker is interrogated -Continue to hold Lasix and continue gentle hydration. -Continue to hold Klor-Con -Continue home metoprolol, spironolactone, losartan  HFrEF with improved ejection fraction Echo in 2018 showed LVEF of 30-35%, repeat in 2019 showed LVEF of 60 to 65%.  Currently with pacemaker and medical management including Lasix 20 mg as needed for swelling, losartan, Toprol-XL, Aldactone.  Patient continues to appear euvolemic on exam today. -Repeat echo pending at this time -Gentle hydration but monitor fluid status -Continuous cardiac monitoring   T2DM Current medications include Metformin 500 mg twice daily although it is prescribed 1000 mg twice daily.  Hemoglobin A1c on 07/15/2020 was 7.2. -Holding Metformin while hospitalized -Sensitive sliding scale insulin -Heart healthy carb modified diet  HTN Range from 102/84-145/69 with most recent being 109/54. Home medications include metoprolol XL 50 mg twice daily, losartan 25 mg daily, Aldactone 12.5 mg daily. -Continue home  medications -Vitals per routine  HLD Patient with history of hyperlipidemia.  Not currently on a statin.  Parkinson's Home medications include Sinemet 25-100 mg 3 times daily -Continue home medications  Asthma Home medications include Xopenex, Symbicort. -Continue controller medication, Breo Ellipta because it is on formulary -Monitor respiratory status and patient can have albuterol if needed  FEN/GI: Heart healthy carb modified PPx: Lovenox   Status is: Observation  The patient remains OBS appropriate and will d/c before 2 midnights.  Dispo: The patient is from: Home              Anticipated d/c is to: Home              Patient currently is not medically stable to d/c.   Difficult to place patient No        Subjective:  Patient doing well this morning.  Reports work of breathing is improved and she does not feel the heart palpitations at this time although she reports feeling them intermittently throughout the night.  Especially with exertion.  No questions or concerns at this time. Objective: Temp:  [98 F (36.7 C)-98.9 F (37.2 C)] 98.3 F (36.8 C) (03/20 0451) Pulse Rate:  [85-141] 88 (03/20 0451) Resp:  [11-32] 18 (03/20 0451) BP: (102-145)/(46-116) 109/54 (03/20 0451) SpO2:  [94 %-99 %] 99 % (03/20 0451) Weight:  [59 kg] 59 kg (03/20 0217) Physical Exam: General: Well-appearing 70 year old female in no acute distress Cardiovascular: Regular rate and rhythm, no murmurs appreciated Respiratory: Normal work of breathing, lungs clear to auscultation bilaterally, no crackles appreciated Abdomen: Soft, nontender, positive bowel sounds Extremities: No edema or gross abnormalities noted  Laboratory: Recent Labs  Lab 10/17/20 1156  WBC 11.0*  HGB 13.2  HCT 39.0  PLT 230   Recent Labs  Lab 10/17/20 1156 10/17/20 1402 10/18/20 0222  NA 137  --  136  K 4.4  --  4.0  CL 104  --  102  CO2 22  --  24  BUN 19  --  19  CREATININE 1.09*  --  1.04*   CALCIUM 9.8  --  10.0  PROT  --  7.5  --   BILITOT  --  0.8  --   ALKPHOS  --  96  --   ALT  --  7  --   AST  --  22  --   GLUCOSE 160*  --  114*   Troponin 7> 11 BNP 20.6  TSH within normal limits  Imaging/Diagnostic Tests: DG Chest 2 View  Result Date: 10/17/2020 CLINICAL DATA:  Chest pain EXAM: CHEST - 2 VIEW COMPARISON:  Chest radiograph dated 06/15/2020 FINDINGS: The heart size and mediastinal contours are within normal limits. Both lungs are clear. Degenerative changes are seen in the spine. A left subclavian approach cardiac device is redemonstrated. IMPRESSION: No active cardiopulmonary disease. Electronically Signed   By: Zerita Boers M.D.   On: 10/17/2020 13:01   CT Angio Chest PE W/Cm &/Or Wo Cm  Result Date: 10/17/2020 CLINICAL DATA:  Cardiac palpitations with pain in the jaw, dizziness, and headache EXAM: CT ANGIOGRAPHY CHEST WITH CONTRAST TECHNIQUE: Multidetector CT imaging of the chest was performed using the standard protocol during bolus administration of intravenous contrast. Multiplanar CT image reconstructions and MIPs were obtained to evaluate the vascular anatomy. CONTRAST:  72mL OMNIPAQUE IOHEXOL 350 MG/ML SOLN COMPARISON:  Chest radiograph 10/17/2020 and CT chest from 04/20/2013 FINDINGS: Cardiovascular: No filling defect is identified in the pulmonary arterial tree to suggest pulmonary embolus. Atherosclerotic calcification of the aortic arch and right subclavian artery. Aberrant right subclavian artery passes behind the esophagus, which can cause dysphagia lusoria. AICD noted. Mediastinum/Nodes: Unremarkable Lungs/Pleura: Mild right apical scarring versus atelectasis. Mild scarring or atelectasis in the posterior basal segments of both lower lobes. Left lower lobe granuloma on image 70 series 7 common no change from 2014. Upper Abdomen: Cholecystectomy. Musculoskeletal: Thoracic spondylosis. Review of the MIP images confirms the above findings. IMPRESSION: 1. No  filling defect is identified in the pulmonary arterial tree to suggest pulmonary embolus. 2. Aberrant right subclavian artery passes behind the esophagus, which can cause dysphagia lusoria. 3. Mild scarring or atelectasis in the posterior basal segments of both lower lobes. 4. Aortic atherosclerosis. Aortic Atherosclerosis (ICD10-I70.0). Electronically Signed   By: Van Clines M.D.   On: 10/17/2020 18:35     Gifford Shave, MD 10/18/2020, 5:40 AM PGY-2, [place] Intern pager: [phone], text pages welcome

## 2020-10-18 NOTE — Discharge Summary (Addendum)
[facility] Discharge Summary  Patient name: Veronica Shaw Medical record number: [id number] Date of birth: March 28, 1951 Age: 70 y.o. Gender: female Date of Admission: 10/17/2020  Date of Discharge: 10/19/2020 Admitting Physician: Gifford Shave, MD  Primary Care Provider: [name], DO Consultants: Cardiology  Indication for Hospitalization: Tachycardia  Discharge Diagnoses/Problem List:  Acute on chronic CHF Tachycardia  Dyspnea   Disposition: Home  Discharge Condition: Stable  Discharge Exam:  Temp:  [98 F (36.7 C)-99 F (37.2 C)] 99 F (37.2 C) (03/21 0850) Pulse Rate:  [71-85] 75 (03/21 0850) Resp:  [18-20] 18 (03/21 0850) BP: (93-130)/(46-83) 112/64 (03/21 0850) SpO2:  [94 %-100 %] 96 % (03/21 0850) Weight:  [61.5 kg] 61.5 kg (03/21 0809)  Physical Exam: General: well appearing, NAD Cardiovascular: RRR no murmurs Respiratory: CTAB. Normal WOB Abdomen: soft, non distended, non tender Extremities: warm, dry. No LE edema   Brief Hospital Course:  Veronica Shaw is a 70 y.o. female who presented with heart palpitations and jaw pain. PMH is significant for HFrEF, HTN, HLD, Parkinson's, asthma. Below is her brief hospital course listed by problem. Refer to the H&P for additional information.   Heart palpitations with shortness of breath   sinus tachycardia   Pacemaker EKG in ED with sinus tachycardia to 130's. Given an additional dose of lopressor and repeat EKG continued to show tachycardia. CTA with no PE and BNP normal at 20.6. Troponins 7>11. Started on IV Metoprolol and gentle fluid hydration with improvement in HR. Cardiology consulted and pacemaker interrogated without abnormal findings.    HFrEF  Echo showed EF 40-45%. Patient appeared euvolemic on examination. Home Lasix was held during admission but she was continued on metoprolol, losartan and spironolactone. Received gentle fluid hydration. At time of discharge  patient's home Furosemide was increased from 20mg  to 40mg  daily. Outpatient follow up was arranged with a member of Dr. Antionette Char team.   Other chronic conditions stable.    Issues for Follow Up:  1. Follow up with PCP- See how patient is tolerating increase in Lasix. Also, per Cardiology can consider SGLT2i outpatient   Significant Procedures: ICD interrogation  Significant Labs and Imaging:  Recent Labs  Lab 10/17/20 1156  WBC 11.0*  HGB 13.2  HCT 39.0  PLT 230   Recent Labs  Lab 10/17/20 1156 10/17/20 1402 10/18/20 0222  NA 137  --  136  K 4.4  --  4.0  CL 104  --  102  CO2 22  --  24  GLUCOSE 160*  --  114*  BUN 19  --  19  CREATININE 1.09*  --  1.04*  CALCIUM 9.8  --  10.0  MG  --  1.8  --   PHOS  --  2.6  --   ALKPHOS  --  96  --   AST  --  22  --   ALT  --  7  --   ALBUMIN  --  3.9  --     Results/Tests Pending at Time of Discharge: None   Discharge Medications:  Allergies as of 10/19/2020      Reactions   Aspirin Swelling, Other (See Comments)   Other reaction(s): Other (See Comments) Causes nose bleeds *ONLY THE COATED ASA* Causes nose bleeds *ONLY THE COATED ASA*   Penicillins Shortness Of Breath, Rash   Shortness of Breath - Throat felt like it was closing.    Prempro [conj Estrog-medroxyprogest Ace] Shortness Of Breath  Throat swelling Throat swelling   Ace Inhibitors Cough   Simvastatin Other (See Comments), Rash   Muscle aches      Medication List    TAKE these medications   APPLE CIDER VINEGAR PO Take 1 capsule by mouth daily.   Biotin 5000 MCG Caps Take 1 tablet by mouth daily.   budesonide-formoterol 160-4.5 MCG/ACT inhaler Commonly known as: Symbicort Inhale 2 puffs into the lungs 2 (two) times daily.   carbidopa-levodopa 25-100 MG tablet Commonly known as: SINEMET IR TAKE 1 TABLET BY MOUTH THREE TIMES DAILY   famotidine 20 MG tablet Commonly known as: PEPCID TAKE 1 TABLET TWICE DAILY   Flonase Sensimist 27.5 MCG/SPRAY  nasal spray Generic drug: fluticasone Place 2 sprays into the nose daily.   furosemide 40 MG tablet Commonly known as: LASIX Take 1 tablet (40 mg total) by mouth daily. Start taking on: October 20, 2020 What changed:   how much to take  how to take this  when to take this  additional instructions   levalbuterol 0.63 MG/3ML nebulizer solution Commonly known as: XOPENEX USE 1 VIAL IN NEBULIZER EVERY 4 TO 6 HOURS AS NEEDED FOR WHEEZING AND FOR SHORTNESS OF BREATH   losartan 25 MG tablet Commonly known as: COZAAR TAKE 1 TABLET EVERY DAY   meclizine 12.5 MG tablet Commonly known as: ANTIVERT Take 1 tablet (12.5 mg total) by mouth daily as needed for dizziness. Should not be long term.   melatonin 5 MG Tabs Take 5 mg by mouth at bedtime.   metFORMIN 500 MG tablet Commonly known as: GLUCOPHAGE Take 2 tablets (1,000 mg total) by mouth 2 (two) times daily with a meal. What changed: how much to take   metoprolol succinate 50 MG 24 hr tablet Commonly known as: TOPROL-XL TAKE 1 TABLET TWICE DAILY   OXYGEN Inhale into the lungs at bedtime. Inhale 2L into lungs   potassium chloride 10 MEQ tablet Commonly known as: KLOR-CON Take 10 mEq by mouth daily as needed (low potassium).   spironolactone 25 MG tablet Commonly known as: ALDACTONE Take 0.5 tablets (12.5 mg total) by mouth daily. Start taking on: October 20, 2020   ULTRA COQ10 PO Take 1 tablet by mouth 3 (three) times a week.       Discharge Instructions: Please refer to Patient Instructions section of EMR for full details.  Patient was counseled important signs and symptoms that should prompt return to medical care, changes in medications, dietary instructions, activity restrictions, and follow up appointments.   Follow-Up Appointments:  Follow-up Information    [place]. Go on 10/21/2020.   Why: Please arrive at 1:15 PM for a 1:30 PM appointment Contact information: [place]       [address]pp T, [place]. Go on 11/04/2020.   Specialties: Cardiology, Physician Assistant Why: Please got to 2:15 PM appt Contact information: [address] [address] [phone]               Shary Key, DO 10/19/2020, 3:27 PM PGY-1, [place]

## 2020-10-18 NOTE — Hospital Course (Addendum)
Veronica Shaw is a 70 y.o. female who presented with heart palpitations and jaw pain. PMH is significant for HFrEF, HTN, HLD, Parkinson's, asthma. Below is her brief hospital course listed by problem. Refer to the H&P for additional information.   Heart palpitations with shortness of breath  sinus tachycardia  Pacemaker EKG in ED with sinus tachycardia to 130's. Given an additional dose of lopressor and repeat EKG continued to show tachycardia. CTA with no PE and BNP normal at 20.6. Troponins 7>11. Started on IV Metoprolol and gentle fluid hydration with improvement in HR. Cardiology consulted and pacemaker interrogated without abnormal findings.    HFrEF  Echo showed EF 40-45%. Patient appeared euvolemic on examination. Home Lasix was held during admission but she was continued on metoprolol, losartan and spironolactone. Received gentle fluid hydration. At time of discharge patient's home Furosemide was increased from 20mg  to 40mg  daily. Outpatient follow up was arranged with a member of Dr. Antionette Char team.   Other chronic conditions stable.

## 2020-10-18 NOTE — Progress Notes (Signed)
  Echocardiogram 2D Echocardiogram has been performed.  Veronica Shaw 10/18/2020, 11:57 AM

## 2020-10-19 ENCOUNTER — Encounter (HOSPITAL_COMMUNITY): Payer: Self-pay | Admitting: Family Medicine

## 2020-10-19 ENCOUNTER — Other Ambulatory Visit: Payer: Self-pay | Admitting: *Deleted

## 2020-10-19 DIAGNOSIS — I5023 Acute on chronic systolic (congestive) heart failure: Secondary | ICD-10-CM

## 2020-10-19 HISTORY — DX: Acute on chronic systolic (congestive) heart failure: I50.23

## 2020-10-19 LAB — GLUCOSE, CAPILLARY
Glucose-Capillary: 209 mg/dL — ABNORMAL HIGH (ref 70–99)
Glucose-Capillary: 160 mg/dL — ABNORMAL HIGH (ref 70–99)

## 2020-10-19 MED ORDER — FUROSEMIDE 40 MG PO TABS
40.0000 mg | ORAL_TABLET | Freq: Every day | ORAL | Status: DC
  Administered 2020-10-19: 40 mg via ORAL
  Filled 2020-10-19: qty 1

## 2020-10-19 MED ORDER — FUROSEMIDE 40 MG PO TABS: 40.0000 mg | ORAL_TABLET | Freq: Every day | ORAL | 0 refills | Status: DC

## 2020-10-19 MED ORDER — SPIRONOLACTONE 25 MG PO TABS: 12.5000 mg | ORAL_TABLET | Freq: Every day | ORAL | 0 refills | Status: DC

## 2020-10-19 NOTE — Progress Notes (Signed)
Progress Note  Patient Name: Veronica Shaw Date of Encounter: 10/19/2020  Primary Cardiologist:  Sherren Mocha, MD  Subjective   Breathing well, feels at baseline. dtr and granddtr buy the groceries, does not weigh daily, does not drink much water Had a breathing treatment this am, that helped her as well  Inpatient Medications    Scheduled Meds: . carbidopa-levodopa  1 tablet Oral TID  . enoxaparin (LOVENOX) injection  40 mg Subcutaneous QHS  . famotidine  20 mg Oral BID  . fluticasone furoate-vilanterol  1 puff Inhalation Daily  . insulin aspart  0-9 Units Subcutaneous TID WC  . losartan  25 mg Oral Daily  . melatonin  5 mg Oral QHS  . metoprolol succinate  50 mg Oral BID  . spironolactone  12.5 mg Oral Daily   Continuous Infusions: . lactated ringers Stopped (10/18/20 1100)   PRN Meds: acetaminophen, ondansetron (ZOFRAN) IV   Vital Signs    Vitals:   10/18/20 2148 10/19/20 0030 10/19/20 0514 10/19/20 0732  BP: (!) 113/46 (!) 97/52 (!) 130/54   Pulse: 85 73 71   Resp: 20 19 20    Temp: 98 F (36.7 C) 98.3 F (36.8 C) 98.3 F (36.8 C)   TempSrc: Oral Oral Oral   SpO2: 100% 98% 99% 94%  Weight:      Height:        Intake/Output Summary (Last 24 hours) at 10/19/2020 0748 Last data filed at 10/18/2020 2150 Gross per 24 hour  Intake 240 ml  Output --  Net 240 ml   Filed Weights   10/18/20 0217  Weight: 59 kg   Last Weight  Most recent update: 10/18/2020  2:20 AM   Weight  59 kg (130 lb 1.1 oz)           Weight change:    Telemetry    SR - Personally Reviewed  ECG    None today - Personally Reviewed  Physical Exam   General: Well developed, well nourished, female appearing in no acute distress. Head: Normocephalic, atraumatic.  Neck: Supple without bruits, JVD mildly elevated. Lungs:  Resp regular and unlabored, few rales bases Heart: RRR, S1, S2, no S3, S4, or murmur; no rub. Abdomen: Soft, non-tender, non-distended with normoactive  bowel sounds. No hepatomegaly. No rebound/guarding. No obvious abdominal masses. Extremities: No clubbing, cyanosis, no edema. Distal pedal pulses are 2+ bilaterally. Neuro: Alert and oriented X 3. Moves all extremities spontaneously. Psych: Normal affect.  Labs    Hematology Recent Labs  Lab 10/17/20 1156  WBC 11.0*  RBC 4.31  HGB 13.2  HCT 39.0  MCV 90.5  MCH 30.6  MCHC 33.8  RDW 11.9  PLT 230    Chemistry Recent Labs  Lab 10/17/20 1156 10/17/20 1402 10/18/20 0222  NA 137  --  136  K 4.4  --  4.0  CL 104  --  102  CO2 22  --  24  GLUCOSE 160*  --  114*  BUN 19  --  19  CREATININE 1.09*  --  1.04*  CALCIUM 9.8  --  10.0  PROT  --  7.5  --   ALBUMIN  --  3.9  --   AST  --  22  --   ALT  --  7  --   ALKPHOS  --  96  --   BILITOT  --  0.8  --   GFRNONAA 54*  --  57*  ANIONGAP 11  --  10     High Sensitivity Troponin:   Recent Labs  Lab 10/17/20 1156 10/17/20 1402  TROPONINIHS 7 11      BNP Recent Labs  Lab 10/17/20 1156  BNP 20.6    Lab Results  Component Value Date   CHOL 197 02/04/2020   HDL 47 02/04/2020   LDLCALC 119 (H) 02/04/2020   LDLDIRECT 111 (H) 02/10/2015   TRIG 175 (H) 02/04/2020   CHOLHDL 4.2 02/04/2020   Lab Results  Component Value Date   TSH 2.143 10/17/2020     DDimer No results for input(s): DDIMER in the last 168 hours.   Radiology    DG Chest 2 View  Result Date: 10/17/2020 CLINICAL DATA:  Chest pain EXAM: CHEST - 2 VIEW COMPARISON:  Chest radiograph dated 06/15/2020 FINDINGS: The heart size and mediastinal contours are within normal limits. Both lungs are clear. Degenerative changes are seen in the spine. A left subclavian approach cardiac device is redemonstrated. IMPRESSION: No active cardiopulmonary disease. Electronically Signed   By: Zerita Boers M.D.   On: 10/17/2020 13:01   CT Angio Chest PE W/Cm &/Or Wo Cm  Result Date: 10/17/2020 CLINICAL DATA:  Cardiac palpitations with pain in the jaw, dizziness, and  headache EXAM: CT ANGIOGRAPHY CHEST WITH CONTRAST TECHNIQUE: Multidetector CT imaging of the chest was performed using the standard protocol during bolus administration of intravenous contrast. Multiplanar CT image reconstructions and MIPs were obtained to evaluate the vascular anatomy. CONTRAST:  27mL OMNIPAQUE IOHEXOL 350 MG/ML SOLN COMPARISON:  Chest radiograph 10/17/2020 and CT chest from 04/20/2013 FINDINGS: Cardiovascular: No filling defect is identified in the pulmonary arterial tree to suggest pulmonary embolus. Atherosclerotic calcification of the aortic arch and right subclavian artery. Aberrant right subclavian artery passes behind the esophagus, which can cause dysphagia lusoria. AICD noted. Mediastinum/Nodes: Unremarkable Lungs/Pleura: Mild right apical scarring versus atelectasis. Mild scarring or atelectasis in the posterior basal segments of both lower lobes. Left lower lobe granuloma on image 70 series 7 common no change from 2014. Upper Abdomen: Cholecystectomy. Musculoskeletal: Thoracic spondylosis. Review of the MIP images confirms the above findings. IMPRESSION: 1. No filling defect is identified in the pulmonary arterial tree to suggest pulmonary embolus. 2. Aberrant right subclavian artery passes behind the esophagus, which can cause dysphagia lusoria. 3. Mild scarring or atelectasis in the posterior basal segments of both lower lobes. 4. Aortic atherosclerosis. Aortic Atherosclerosis (ICD10-I70.0). Electronically Signed   By: Van Clines M.D.   On: 10/17/2020 18:35   ECHOCARDIOGRAM COMPLETE  Result Date: 10/18/2020    ECHOCARDIOGRAM REPORT   Patient Name:   Veronica Shaw Date of Exam: 10/18/2020 Medical Rec #:  [id number]          Height:       61.5 in Accession #:    [id number]         Weight:       130.1 lb Date of Birth:  [birth year]           BSA:          1.583 m Patient Age:    70 years           BP:           122/59 mmHg Patient Gender: F                  HR:           76  bpm. Exam Location:  Inpatient Procedure: 2D Echo, Cardiac Doppler and Color  Doppler Indications:    Abnormal ECG  History:        Patient has prior history of Echocardiogram examinations, most                 recent 10/18/2017. CHF, Defibrillator, Signs/Symptoms:Shortness                 of Breath; Risk Factors:Hypertension, Diabetes and Dyslipidemia.                 NICM.  Sonographer:    Clayton Lefort RDCS (AE) Referring Phys: [place]  1. Left ventricular ejection fraction, by estimation, is 40 to 45%. The left ventricle has mildly decreased function. The left ventricle demonstrates global hypokinesis. Left ventricular diastolic function could not be evaluated.  2. Right ventricular systolic function is normal. The right ventricular size is normal. There is normal pulmonary artery systolic pressure. The estimated right ventricular systolic pressure is 72.5 mmHg.  3. The mitral valve is normal in structure. No evidence of mitral valve regurgitation. No evidence of mitral stenosis.  4. The aortic valve is normal in structure. Aortic valve regurgitation is not visualized. No aortic stenosis is present.  5. The inferior vena cava is normal in size with greater than 50% respiratory variability, suggesting right atrial pressure of 3 mmHg. FINDINGS  Left Ventricle: Left ventricular ejection fraction, by estimation, is 40 to 45%. The left ventricle has mildly decreased function. The left ventricle demonstrates global hypokinesis. The left ventricular internal cavity size was normal in size. There is  no left ventricular hypertrophy. Abnormal (paradoxical) septal motion, consistent with RV pacemaker. Left ventricular diastolic function could not be evaluated due to paced rhythm. Left ventricular diastolic function could not be evaluated. Normal left ventricular filling pressure. Right Ventricle: The right ventricular size is normal. No increase in right ventricular wall thickness. Right ventricular  systolic function is normal. There is normal pulmonary artery systolic pressure. The tricuspid regurgitant velocity is 2.18 m/s, and  with an assumed right atrial pressure of 3 mmHg, the estimated right ventricular systolic pressure is 36.6 mmHg. Left Atrium: Left atrial size was normal in size. Right Atrium: Right atrial size was normal in size. Pericardium: There is no evidence of pericardial effusion. Mitral Valve: The mitral valve is normal in structure. No evidence of mitral valve regurgitation. No evidence of mitral valve stenosis. Tricuspid Valve: The tricuspid valve is normal in structure. Tricuspid valve regurgitation is mild . No evidence of tricuspid stenosis. Aortic Valve: The aortic valve is normal in structure. Aortic valve regurgitation is not visualized. No aortic stenosis is present. Aortic valve mean gradient measures 1.0 mmHg. Aortic valve peak gradient measures 3.1 mmHg. Aortic valve area, by VTI measures 3.39 cm. Pulmonic Valve: The pulmonic valve was normal in structure. Pulmonic valve regurgitation is not visualized. No evidence of pulmonic stenosis. Aorta: The aortic root is normal in size and structure. Venous: The inferior vena cava is normal in size with greater than 50% respiratory variability, suggesting right atrial pressure of 3 mmHg. IAS/Shunts: No atrial level shunt detected by color flow Doppler. Additional Comments: A device lead is visualized.  LEFT VENTRICLE PLAX 2D LVIDd:         4.20 cm  Diastology LVIDs:         3.10 cm  LV e' medial:    5.44 cm/s LV PW:         1.00 cm  LV E/e' medial:  11.0 LV IVS:  0.80 cm  LV e' lateral:   6.74 cm/s LVOT diam:     2.20 cm  LV E/e' lateral: 8.9 LV SV:         54 LV SV Index:   34 LVOT Area:     3.80 cm  RIGHT VENTRICLE             IVC RV Basal diam:  2.30 cm     IVC diam: 2.00 cm RV S prime:     11.70 cm/s TAPSE (M-mode): 1.9 cm LEFT ATRIUM             Index       RIGHT ATRIUM          Index LA diam:        1.50 cm 0.95 cm/m  RA  Area:     9.49 cm LA Vol (A2C):   22.8 ml 14.41 ml/m RA Volume:   16.60 ml 10.49 ml/m LA Vol (A4C):   28.7 ml 18.13 ml/m LA Biplane Vol: 27.5 ml 17.38 ml/m  AORTIC VALVE AV Area (Vmax):    2.60 cm AV Area (Vmean):   2.83 cm AV Area (VTI):     3.39 cm AV Vmax:           87.90 cm/s AV Vmean:          57.300 cm/s AV VTI:            0.159 m AV Peak Grad:      3.1 mmHg AV Mean Grad:      1.0 mmHg LVOT Vmax:         60.10 cm/s LVOT Vmean:        42.600 cm/s LVOT VTI:          0.142 m LVOT/AV VTI ratio: 0.89  AORTA Ao Root diam: 3.10 cm Ao Asc diam:  3.00 cm MITRAL VALVE               TRICUSPID VALVE MV Area (PHT): 3.46 cm    TR Peak grad:   19.0 mmHg MV Decel Time: 219 msec    TR Vmax:        218.00 cm/s MV E velocity: 60.00 cm/s MV A velocity: 90.00 cm/s  SHUNTS MV E/A ratio:  0.67        Systemic VTI:  0.14 m                            Systemic Diam: 2.20 cm Fransico Him MD Electronically signed by Fransico Him MD Signature Date/Time: 10/18/2020/12:38:48 PM    Final      Cardiac Studies   ECHO:  10/18/2020 1. Left ventricular ejection fraction, by estimation, is 40 to 45%. The  left ventricle has mildly decreased function. The left ventricle  demonstrates global hypokinesis. Left ventricular diastolic function could  not be evaluated.  2. Right ventricular systolic function is normal. The right ventricular  size is normal. There is normal pulmonary artery systolic pressure. The  estimated right ventricular systolic pressure is 69.4 mmHg.  3. The mitral valve is normal in structure. No evidence of mitral valve  regurgitation. No evidence of mitral stenosis.  4. The aortic valve is normal in structure. Aortic valve regurgitation is  not visualized. No aortic stenosis is present.  5. The inferior vena cava is normal in size with greater than 50%  respiratory variability, suggesting right atrial pressure of 3 mmHg.   Patient Profile  70 y.o. female w/ hx NICM, recovered HFrEF  (previously 20% now 60%), s/p dual chamber ICD, DM, HLD, HTN was admitted 03/19 with palpitations and SOB >> CHF exacerbation.  Assessment & Plan    1. Acute systolic and diastolic CHF - s/p diuresis w/ IV Lasix  - I/O incomplete and wt not tracked daily >> get wt today - sx are improved and O2 sats 99% on room air - she was on Lasix 20 mg qd at home - will order Lasix 40 mg qd po for now, discuss d/c dose w/ MD - EF in 2019 was 60-65%, now 40-45% - Toprol XL 50 mg bid, losartan 25 mg qd and spiro 12.5 mg qd are home meds - SBP 97 at one point yesterday, not sure BP will tolerate changing losartan to Entresto  2. Sinus tach - [name] saw 03/20, and no atrial tach seen, only sinus - EP to interrogate ICD again for more information - suspect HR was elevated in response to SOB/CHF  Otherwise, per IM Principal Problem:   Sinus tachycardia ([place]) Active Problems:   Acute on chronic systolic CHF (congestive heart failure), NYHA class 4 (HCC)   Dyspnea   Chronic HFrEF (heart failure with reduced ejection fraction) ([place])    Signed, Rosaria Ferries , PA-C 7:48 AM 10/19/2020 Pager: [phone]

## 2020-10-19 NOTE — Plan of Care (Signed)

## 2020-10-19 NOTE — Discharge Instructions (Signed)
Heart Failure, Self-Care Heart failure is a serious condition. The following information explains the things you need to do to take care of yourself after a heart failure diagnosis. You may be asked to change your diet, take certain medicines, and make other lifestyle changes in order to stay as healthy as possible. Your health care provider may also give you more specific instructions. If you have problems or questions, contact your health care provider. What are the risks? Having heart failure puts you at higher risk for certain problems. These problems can get worse if you do not take good care of yourself. Problems may include:  Damage to the kidneys, liver, or lungs.  Malnutrition.  Abnormal heart rhythms.  Blood clotting issues that could cause a stroke. Supplies needed:  Scale for monitoring weight.  Blood pressure monitor.  Notebook.  Medicines. How to care for yourself when you have heart failure Medicines Take over-the-counter and prescription medicines only as told by your health care provider. Medicines reduce the workload of your heart, slow the progression of heart failure, and improve symptoms. Take your medicines every day.  Do not stop taking your medicine unless your health care provider tells you to do so.  Do not skip any dose of medicine.  Refill your prescriptions before you run out of medicine.  Talk with your health care provider if you cannot afford your medicines. Eating and drinking  Eat heart-healthy foods. Talk with a dietitian to make an eating plan that is right for you. ? Limit salt (sodium) if told by your health care provider. Sodium restriction may reduce symptoms of heart failure. Ask a dietitian to recommend heart-healthy seasonings. ? Use healthy cooking methods instead of frying. Healthy methods include roasting, grilling, broiling, baking, poaching, steaming, and stir-frying. ? Choose foods that contain no trans fat and are low in  saturated fat and cholesterol. Healthy choices include fresh or frozen fruits and vegetables, fish, lean meats, legumes, fat-free or low-fat dairy products, and whole-grain or high-fiber foods.  Limit your fluid intake, if directed by your health care provider. Fluid restriction may reduce symptoms of heart failure.   Alcohol use  Do not drink alcohol if: ? Your health care provider tells you not to drink. ? Your heart was damaged by alcohol, or you have severe heart failure. ? You are pregnant, may be pregnant, or are planning to become pregnant.  If you drink alcohol: ? Limit how much you have to:  0-1 drink a day for women.  0-2 drinks a day for men. ? Know how much alcohol is in your drink. In the [place] one drink equals one 12 oz bottle of beer (355 mL), one 5 oz glass of wine (148 mL), or one 1 oz glass of hard liquor (44 mL). Lifestyle  Do not use any products that contain nicotine or tobacco. These products include cigarettes, chewing tobacco, and vaping devices, such as e-cigarettes. If you need help quitting, ask your health care provider. ? Do not use nicotine gum or patches before talking to your health care provider.  Do not use illegal drugs.  Work with your health care provider to safely reach the right body weight.  Do physical activity if told by your health care provider. Talk to your health care provider before you begin an exercise if: ? You are an older adult. ? You have severe heart failure.  Learn to manage stress. If you need help to do this, ask your health care provider.  Participate in or seek physical rehabilitation as needed to keep or improve your independence and quality of life.  Participate in a cardiac rehabilitation program, which is a treatment program to improve your health and well-being through exercise training, education, and counseling.  Plan rest periods when you get tired.   Monitoring important information  Weigh yourself every day.  This will help you to notice if too much fluid is building up in your body. ? Weigh yourself every morning after you urinate and before you eat breakfast. ? Wear the same amount of clothing each time you weigh yourself. ? Record your daily weight. Provide your health care provider with your weight record.  Monitor and record your pulse and blood pressure as told by your health care provider.   Dealing with extreme temperatures  If the weather is extremely hot: ? Avoid vigorous physical activity. ? Use air conditioning or fans, or find a cooler location. ? Avoid caffeine and alcohol. ? Wear loose-fitting, lightweight, and light-colored clothing.  If the weather is extremely cold: ? Avoid vigorous activity. ? Layer your clothes. ? Wear mittens or gloves, a hat, and a face covering when you go outside. ? Avoid alcohol. Follow these instructions at home:  Stay up to date with vaccines. Pneumococcal and flu (influenza) vaccines are especially important in preventing infections of the airways.  Keep all follow-up visits. This is important. Contact a health care provider if you:  Gain 2-3 lb (1-1.4 kg) in 24 hours or 5 lb (2.3 kg) in a week.  Have increasing shortness of breath.  Are unable to participate in your usual physical activities.  Get tired easily.  Cough more than normal, especially with physical activity.  Lose your appetite or feel nauseous.  Have any swelling or more swelling in areas such as your hands, feet, ankles, or abdomen.  Are unable to sleep because it is hard to breathe.  Feel like your heart is beating quickly (palpitations).  Become dizzy or light-headed when you stand up.  Have feelings of depression or sadness. Get help right away if you:  Have trouble breathing.  Notice, or your family notices, a change in your awareness, such as having trouble staying awake or concentrating.  Have pain or discomfort in your chest.  Have an episode of  fainting (syncope). These symptoms may represent a serious problem that is an emergency. Do not wait to see if the symptoms will go away. Get medical help right away. Call your local emergency services (911 in the [place] Do not drive yourself to the hospital. Summary  Heart failure is a serious condition. To care for yourself, you may be asked to change your diet, take certain medicines, and make other lifestyle changes.  Take your medicines every day. Do not stop taking them unless your health care provider tells you to do so.  Limit salt and eat heart-healthy foods, such as fresh or frozen fruits and vegetables, fish, lean meats, legumes, fat-free or low-fat dairy products, and whole-grain or high-fiber foods.  Ask your health care provider if you have any alcohol restrictions. You may have to stop drinking alcohol if you have severe heart failure.  Contact your health care provider if you notice problems, such as rapid weight gain or a fast heartbeat. Get help right away if you faint or have chest pain or trouble breathing. This information is not intended to replace advice given to you by your health care provider. Make sure you discuss any questions you  have with your health care provider. Document Revised: 02/08/2020 Document Reviewed: 02/08/2020 Elsevier Patient Education  [place] were treated in the hospital for palpitations, chest pain, a fast heart rate, and heart failure. You were started on metoproplol with improvement in heart rate. Your pacemaker was in good working order.  Please take the following medications: - Lasix 40 mg daily - Losartan 25 mg daily - Metoprolol succinate 50 mg BID - Sprionolactone 12.5mg  daily   WEIGH DAILY, every am, wearing the same amount of clothing Record weights, and bring it to office appointments Contact Sherren Mocha, MD for weight gain of 3 lbs in a day or 5 lbs in a week Limit sodium to 500 mg per meal, total 2000 mg per  day You need to drink at least a quart of water daily, but you should Limit all liquids to 1.5-2 liters/quarts per day.

## 2020-10-19 NOTE — Patient Outreach (Signed)
[facility]) Care Management  10/19/2020  Veronica Shaw Jan 25, 1951 [id number]   [facility] Discipline closure. Patient has been admitted to hospital. Patient will be followed by Complex Care Coordinator once discharged.  Plan: RN Health Coach Discipline Closure Discipline Closure letter sent to PCP  [facility] [phone]

## 2020-10-19 NOTE — Consult Note (Signed)
   [facility] CM Inpatient Consult   10/19/2020  Veronica Shaw Aug 07, 1950 [id number]  [name]    Patient was active with [facility] for chronic disease management services.  Patient has been engaged by a [place] community based plan of care has focused on disease management and community resource support.    Plan: Patient is a member in the [facility] and referred to Embedded team RN for Chronic Care Management for heart disease    Of note, [name] services does not replace or interfere with any services that are needed or arranged by inpatient [facility] care management team.  For additional questions or referrals please contact:  Natividad Brood, RN BSN [facility] Liaison  [phone] business mobile phone Toll free office [phone]  Fax number: [phone] [name]

## 2020-10-21 ENCOUNTER — Other Ambulatory Visit: Payer: Self-pay

## 2020-10-21 ENCOUNTER — Ambulatory Visit (INDEPENDENT_AMBULATORY_CARE_PROVIDER_SITE_OTHER): Payer: Medicare Other | Admitting: Family Medicine

## 2020-10-21 VITALS — BP 110/85 | HR 88 | Ht 61.5 in | Wt 135.2 lb

## 2020-10-21 DIAGNOSIS — I5022 Chronic systolic (congestive) heart failure: Secondary | ICD-10-CM | POA: Diagnosis not present

## 2020-10-21 DIAGNOSIS — R000 Tachycardia, unspecified: Secondary | ICD-10-CM

## 2020-10-21 NOTE — Patient Instructions (Signed)
It was a pleasure to see you today!  Thank you for choosing [facility] for your primary care.   Our plans for today were:  Please continue your medications as prescribed.  [place] follow up with cardiology as scheduled  Follow up with PCP in 3 months for continued monitoring, sooner if you get recurrent chest pain or shortness of breath.  Best Wishes,   Mina Marble, DO

## 2020-10-21 NOTE — Progress Notes (Signed)
   Subjective:   Patient ID: Veronica Shaw    DOB: Dec 27, 1950, 70 y.o. female   MRN: [id number]  Veronica Shaw is a 70 y.o. female with a history of HFrEF, HTN, NICM, chronic respiratory failure w/ hypoxia, chronic rhinitis, mild persistent asthma, GERD, HLD, T2DM, cervical dystonia, dementia, parkinson disease, RA, arthropathy, chronic urticaria, osteopenia, CKD3a, falls, ICD, sinus tachy, vertigo here for Hospital follow up  Hospital Follow up for Heart Palpitation and SOB Sinus tachycardia She was hospitalized from 3/20 to 3/21. She was noted to have sinus tachycardia in ED. CTA negative for PE, BNP normal at 20.6. Troponins low at 7>11. She was treated with IV metoprolol and gentle fluid hydration with improvement in heart rate. Cardiology was consulted and pacemaker interrogated without abnormal findings. She has cardiology follow up on 4/6. Denies any further chest pain or palpitations.  HFrEF: She was euvolemic during hospitalization. Her home lasix was increased from 20mg  to 40mg  QD. Today she notes good urine output within 1-2 hours after taking her medicine. Denies shortness of breath, LE swelling, or weight gain.   Review of Systems:  Per HPI.   Objective:   BP 110/85   Pulse 88   Ht 5' 1.5" (1.562 m)   Wt 135 lb 3.2 oz (61.3 kg)   LMP 08/01/2000 (Approximate)   SpO2 95%   BMI 25.13 kg/m  Vitals and nursing note reviewed.  General: pleasant older female, sitting comfortably in exam chair, well nourished, well developed, in no acute distress with non-toxic appearance CV: regular rate and rhythm without murmurs, rubs, or gallops, no lower extremity edema, 2+ radial and pedal pulses bilaterally Lungs: few crackles in lower lobes bilaterally, with normal work of breathing on room air, speaking in full sentences Skin: warm, dry Extremities: warm and well perfused Neuro: Alert and oriented, speech normal  Assessment & Plan:   Chronic HFrEF (heart failure with reduced  ejection fraction) (HCC) Chronic. Asymptomatic. Few crackles in lower lobes bilaterally. Noted by cardiology during hospitalization. Per CTA, she has mild scaring vs atelectasis in posterior basal segments of both lower lobes (possible etiology?). Denies any SOB. No weight gain and adequate urine output with Lasix 40mg  QD. Euvolemic on exam. Follow up with cardiology as scheduled.  Sinus tachycardia (HCC) Acute on chronic. Hospitalized for chest pain, SOB and symptomatic sinus tachycardia. HR 80's today. Denies any further symptoms. Cardiac exam normal. Continue current medications as prescribed. Follow up with cardiology as scheduled.     Mina Marble, DO PGY-3, [facility] 10/21/2020 7:16 PM

## 2020-10-21 NOTE — Assessment & Plan Note (Addendum)
Acute on chronic. Hospitalized for chest pain, SOB and symptomatic sinus tachycardia. HR 80's today. Denies any further symptoms. Cardiac exam normal. Continue current medications as prescribed. Follow up with cardiology as scheduled.

## 2020-10-21 NOTE — Assessment & Plan Note (Signed)
Chronic. Asymptomatic. Few crackles in lower lobes bilaterally. Noted by cardiology during hospitalization. Per CTA, she has mild scaring vs atelectasis in posterior basal segments of both lower lobes (possible etiology?). Denies any SOB. No weight gain and adequate urine output with Lasix 40mg  QD. Euvolemic on exam. Follow up with cardiology as scheduled.

## 2020-10-22 ENCOUNTER — Telehealth: Payer: Self-pay | Admitting: *Deleted

## 2020-10-22 NOTE — Chronic Care Management (AMB) (Signed)
  Care Management   Note  10/22/2020 Name: Veronica Shaw MRN: [id number] DOB: Sep 01, 1950  Veronica Shaw is a 70 y.o. year old female who is a primary care patient of [name], DO. I reached out to [employer] by phone today in response to a referral sent by Veronica Shaw's PCP, [name], DO.  Veronica Shaw was given information about care management services today including:  1. Care management services include personalized support from designated clinical staff supervised by her physician, including individualized plan of care and coordination with other care providers 2. 24/7 contact phone numbers for assistance for urgent and routine care needs. 3. The patient may stop care management services at any time by phone call to the office staff.  Patient agreed to services and verbal consent obtained.   Follow up plan: Telephone appointment with care management team member scheduled for:11/03/2020  Kimsey Demaree  Care Guide, Embedded Care Coordination [place]  Care Management

## 2020-10-26 ENCOUNTER — Ambulatory Visit: Payer: Medicare Other | Admitting: *Deleted

## 2020-11-03 ENCOUNTER — Ambulatory Visit: Payer: Medicare Other

## 2020-11-04 ENCOUNTER — Other Ambulatory Visit: Payer: Self-pay

## 2020-11-04 ENCOUNTER — Ambulatory Visit (INDEPENDENT_AMBULATORY_CARE_PROVIDER_SITE_OTHER): Payer: Medicare Other | Admitting: Physician Assistant

## 2020-11-04 ENCOUNTER — Encounter: Payer: Self-pay | Admitting: Physician Assistant

## 2020-11-04 VITALS — BP 100/40 | HR 92 | Ht 61.5 in | Wt 135.8 lb

## 2020-11-04 DIAGNOSIS — N1831 Chronic kidney disease, stage 3a: Secondary | ICD-10-CM | POA: Diagnosis not present

## 2020-11-04 DIAGNOSIS — R000 Tachycardia, unspecified: Secondary | ICD-10-CM

## 2020-11-04 DIAGNOSIS — I5020 Unspecified systolic (congestive) heart failure: Secondary | ICD-10-CM | POA: Diagnosis not present

## 2020-11-04 DIAGNOSIS — I10 Essential (primary) hypertension: Secondary | ICD-10-CM

## 2020-11-04 NOTE — Progress Notes (Addendum)
Cardiology Office Note:    Date:  11/04/2020   ID:  Veronica Shaw, DOB [birth year], MRN [id number]  PCP:  Clemetine Marker   [facility]  Cardiologist:  Sherren Mocha, MD  Advanced Practice Provider:  Liliane Shi, PA-C Electrophysiologist:  Cristopher Peru, MD       Referring MD: [name], DO   Chief Complaint:  Hospitalization Follow-up (CHF)    Patient Profile:     Veronica Shaw is a 70 y.o. female with:   HFmrEF (heart failure with mildly reduced ejection fraction)  ? Non-ischemic cardiomyopathy   Cath 2014: no sig CAD ? CMR 4/15: EF 34  ? Echocardiogram 5/17: EF 20-25 ? Echocardiogram 6/18: EF 30-35 ? Echocardiogram 3/19: EF 60-65, Gr 2 DD ? Echo 09/2020: EF 40-45 ? S/p AICD  Diabetes mellitus   Hyperlipidemia   OSA   Asthma   Parkinson's Disease   Syncope 09/2017  Cervical spine DDD  Prior CV studies: Echocardiogram 10/18/2020 EF 40-45, global HK, normal RVSF, RVSP 22  Echo 10/18/17 EF 60-65, normal wall motion, grade 2 diastolic dysfunction  Echo 01/11/17 EF 30-35, diffuse HK, grade 1 diastolic dysfunction, trivial MR, mild LAE, mild TR  Nuclear stress test 12/29/16 Large size, moderate intensity fixed inferoseptal/septal perfusion defect consistent with LBBB artifact. No reversible ischemia. LVEF 49% with incoordinate septal motion. This is an intermediate risk study (due to LVEF <50).  RHC 12/16/15 CO 2.9; mean RA 1, PASP 15, mean PA 10, mean PCWP 2 1. Low intracardiac filling pressures 2. Preserved cardiac output  Echo 12/11/15 EF 20-25%, severe diffuse HK, marked systolic dyssynchrony, grade 1 diastolic dysfunction, mild MR  Cardiac MRI (4/15): 1. Mild LVE, EF 34%, Diff HK, paradoxical septal motion c/w LBBB, small focal area of late gadolinium enhancement at basal anteroseptum - may represent RV volume overload or a focal infiltrative disease such as sarcoidosis, but certainly wouldn't  explain the degree of of LV dysfunction. - c/w NICM, normal RV size and function (RVEF 61%), Mild MR  LHC (10/14): Minor luminal irregularity in prox LAD, EF 35%  Echo (2/15): EF 30-35%, diff HK, ant-sept AK, Gr 2 DD, mild MR, trivial TR  Nuclear (5/13): Normal stress nuclear study. LV Ejection Fraction: 58%     History of Present Illness:    Veronica Shaw was last seen in 10/2019.  She saw Dr. Lovena Le in 7/21 after an episode of suspected syncope.  She had no ventricular arrhythmias on her device interrogation.  No therapies were delivered.  No further testing was needed.    She was admitted 3/19-3/21 with tachycardia and decompensated heart failure.  She was seen by EP.  There was some concern for atrial tachycardia.  However, this was felt to be most likely sinus tachycardia.  She had improved symptoms with diuresis.  Her EF had normalized in the past by echocardiogram in 2019.  Echocardiogram during this hospitalization demonstrated reduced LV function with an EF of 40-45%.  Of note, CT was negative for pulmonary embolism.  Her home dose of diuretic was adjusted to furosemide 40 mg daily.  Of note, SGLT2 inhibitor could be considered as an outpatient.   She returns for follow-up.  She is here alone.  She does note some dyspnea at times.  Overall, her shortness of breath is improved since admission to the hospital.  She has not had chest pain, syncope, orthopnea, leg edema.    Past Medical History:  Diagnosis Date  .  Acute on chronic systolic CHF (congestive heart failure), NYHA class 4 ([place]) 10/19/2020  . Asthma   . Back pain 09/18/2019  . Cervical disc disorder with radiculopathy of cervical region 09/24/2018  . Chronic systolic CHF (congestive heart failure) ([place])    a. cMRI 4/15: EF 34% and findings - c/w NICM, normal RV size and function (RVEF 61%), Mild MR // b. Echo 2/15:  EF 30-35%, diff HK, ant-sept AK, Gr 2 DD, mild MR, trivial TR  //  c. Echo 5/17: EF 20-25%, severe  diffuse HK, marked systolic dyssynchrony, grade 1 diastolic dysfunction, mild MR  //  d. RHC 5/17: Fick CO 2.9, RVSP 19, PASP 15, PW mean 2, low filing pressures and preserved CO   . Cognitive impairment 10/19/2017   MOCA was administered with a score of 23/30  . Diabetes mellitus   . Diabetic peripheral neuropathy ([place]) 12/09/2011  . Flu 10/17/2017  . Gastritis   . History of echocardiogram    Echo 6/18: EF 30-35, diffuse HK, grade 1 diastolic dysfunction, trivial MR, mild LAE, mild TR, no pericardial effusion  . History of nuclear stress test    Myoview 5/18: EF 49, no ischemia, inferoseptal defect c/w LBBB artifact (intermediate risk due to EF < 50).  [name] Kitchen HTN (hypertension)   . Hyperlipidemia   . Hyperlipidemia associated with type 2 diabetes mellitus ([place]) 03/05/2019  . Hypertension associated with diabetes ([place]) 09/28/2006   Qualifier: Diagnosis of  By: [name]    . Major depression 03/31/2013  . Melena 08/21/2020  . NICM (nonischemic cardiomyopathy) ([place])    a. Nuclear 5/13: Normal stress nuclear study. LV Ejection Fraction: 58%  //  b. LHC 10/14: Minor luminal irregularity in prox LAD, EF 35%   . Parkinson disease ([place])   . Plantar fasciitis   . Plantar fasciitis, bilateral 06/15/2012  . Rosacea 05/29/2009   Qualifier: Diagnosis of  By: [name] MD, Rodman Key    . Sensorineural hearing loss (SNHL), bilateral 01/04/2017  . Sleep apnea    was retested and no longer had it and so d/c CPAP  . Syncope   . Thoracic or lumbosacral neuritis or radiculitis, unspecified 07/03/2013  . Tinnitus, bilateral 01/04/2017  . Type II diabetes mellitus with complication ([place]) 8/93/7342   Diabetic eye exam done by [facility]mology: Dr. Prudencio Burly on 11/08/13: no diabetic retinopathy. Repeat in 1 year    . Urticaria     Current Medications: Current Meds  Medication Sig  . APPLE CIDER VINEGAR PO Take 1 capsule by mouth daily.  . Biotin 5000 MCG CAPS Take 1 tablet by mouth daily.   .  budesonide-formoterol (SYMBICORT) 160-4.5 MCG/ACT inhaler Inhale 2 puffs into the lungs 2 (two) times daily.  . carbidopa-levodopa (SINEMET IR) 25-100 MG tablet TAKE 1 TABLET BY MOUTH THREE TIMES DAILY  . famotidine (PEPCID) 20 MG tablet TAKE 1 TABLET TWICE DAILY  . fluticasone (FLONASE SENSIMIST) 27.5 MCG/SPRAY nasal spray Place 2 sprays into the nose daily.  . furosemide (LASIX) 40 MG tablet Take 1 tablet (40 mg total) by mouth daily.  [name] Kitchen levalbuterol (XOPENEX) 0.63 MG/3ML nebulizer solution USE 1 VIAL IN NEBULIZER EVERY 4 TO 6 HOURS AS NEEDED FOR WHEEZING AND FOR SHORTNESS OF BREATH  . losartan (COZAAR) 25 MG tablet TAKE 1 TABLET EVERY DAY  . meclizine (ANTIVERT) 12.5 MG tablet Take 1 tablet (12.5 mg total) by mouth daily as needed for dizziness. Should not be long term.  . melatonin 5 MG TABS Take 5 mg by  mouth at bedtime.  . metFORMIN (GLUCOPHAGE) 500 MG tablet Take 2 tablets (1,000 mg total) by mouth 2 (two) times daily with a meal.  . metoprolol succinate (TOPROL-XL) 50 MG 24 hr tablet TAKE 1 TABLET TWICE DAILY  . OXYGEN Inhale into the lungs at bedtime. Inhale 2L into lungs  . potassium chloride (KLOR-CON) 10 MEQ tablet Take 10 mEq by mouth daily as needed (low potassium).  [name] Kitchen spironolactone (ALDACTONE) 25 MG tablet Take 0.5 tablets (12.5 mg total) by mouth daily.  [name] Kitchen Ubiquinone (ULTRA COQ10 PO) Take 1 tablet by mouth 3 (three) times a week.     Allergies:   Aspirin, Penicillins, Prempro [conj estrog-medroxyprogest ace], Ace inhibitors, and Simvastatin   Social History   Tobacco Use  . Smoking status: Never Smoker  . Smokeless tobacco: Never Used  . Tobacco comment: exposed to smoke during child hood (parents)  Vaping Use  . Vaping Use: Never used  Substance Use Topics  . Alcohol use: No    Alcohol/week: 0.0 standard drinks  . Drug use: No     Family Hx: The patient's family history includes Coronary artery disease in her father and mother; Diabetes in her father, mother,  sister, sister, and sister; Diabetes (age of onset: 34) in her son; Healthy in her daughter; Heart attack in her father and mother; Heart disease in her sister, sister, and sister; Hepatitis C in her sister; Parkinson's disease in her sister. There is no history of Breast cancer.  Review of Systems  Gastrointestinal: Positive for hemorrhoids (+bleeding at times).     EKGs/Labs/Other Test Reviewed:    EKG:  EKG is not ordered today.  The ekg ordered today demonstrates n/a  Recent Labs: 10/17/2020: ALT 7; B Natriuretic Peptide 20.6; Hemoglobin 13.2; Magnesium 1.8; Platelets 230; TSH 2.143 10/18/2020: BUN 19; Creatinine, Ser 1.04; Potassium 4.0; Sodium 136   Recent Lipid Panel Lab Results  Component Value Date/Time   CHOL 197 02/04/2020 05:22 PM   TRIG 175 (H) 02/04/2020 05:22 PM   HDL 47 02/04/2020 05:22 PM   CHOLHDL 4.2 02/04/2020 05:22 PM   CHOLHDL 3.5 05/31/2013 04:35 AM   LDLCALC 119 (H) 02/04/2020 05:22 PM   LDLDIRECT 111 (H) 02/10/2015 11:45 AM      Risk Assessment/Calculations:      Physical Exam:    VS:  BP (!) 100/40   Pulse 92   Ht 5' 1.5" (1.562 m)   Wt 135 lb 12.8 oz (61.6 kg)   LMP 08/01/2000 (Approximate)   SpO2 94%   BMI 25.24 kg/m     Wt Readings from Last 3 Encounters:  11/04/20 135 lb 12.8 oz (61.6 kg)  10/21/20 135 lb 3.2 oz (61.3 kg)  10/19/20 135 lb 8 oz (61.5 kg)     Constitutional:      Appearance: Healthy appearance. Not in distress.  Neck:     Vascular: No JVR.  Pulmonary:     Effort: Pulmonary effort is normal.     Breath sounds: No wheezing. No rales.  Cardiovascular:     Normal rate. Regular rhythm. Normal S1. Normal S2.     Murmurs: There is no murmur.  Edema:    Peripheral edema absent.  Abdominal:     Palpations: Abdomen is soft.  Skin:    General: Skin is warm and dry.  Neurological:     Mental Status: Alert and oriented to person, place and time.     Cranial Nerves: Cranial nerves are intact.  ASSESSMENT &  PLAN:    1. 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) She had a recent admission with decompensated heart failure.  An echocardiogram demonstrated worsening LV function with an EF of 40-45%.  In looking through her previous studies, I suspect her ejection fraction has been fairly stable all along.  The echocardiogram in 2019 was likely a generous call.  She is mainly limited by asthma.  NYHA IIb-III.  Volume status appears stable on exam today.  We discussed the importance of weighing daily when to take extra furosemide.  Continue current dose of furosemide.  Obtain follow-up BMET, CBC today.  Follow-up in 3 months.  Continue follow-up with the [facility]CM clinic.  2. Sinus tachycardia This was likely related to decompensated heart failure.  She had no clear evidence of atrial tachycardia on device interrogation.  Continue beta-blocker.  3. Essential hypertension Blood pressure running somewhat low.  At this point, I am not certain she would be able to tolerate SGLT2 inhibitor as she would likely have slight blood pressure reduction.  We will hold off on adding this for now.  4. Stage 3a chronic kidney disease ([place]) Obtain follow-up BMET today as noted.   Dispo:  Return in about 3 months (around 02/03/2021) for Routine Follow Up, w/ Dr. Burt Knack, or Richardson Dopp, PA-C, in person.   Medication Adjustments/Labs and Tests Ordered: Current medicines are reviewed at length with the patient today.  Concerns regarding medicines are outlined above.  Tests Ordered: Orders Placed This Encounter  Procedures  . Basic Metabolic Panel (BMET)  . CBC   Medication Changes: No orders of the defined types were placed in this encounter.   Signed, Richardson Dopp, PA-C  11/04/2020 5:41 PM    [facility] [address], [place], [place]  [address] Phone: [phone]; Fax: [phone]

## 2020-11-04 NOTE — Patient Instructions (Signed)
Medication Instructions:  Your physician recommends that you continue on your current medications as directed. Please refer to the Current Medication list given to you today.  *If you need a refill on your cardiac medications before your next appointment, please call your pharmacy*   Lab Work: Your physician recommends that you have lab work today: Bmet/Cbc  If you have labs (blood work) drawn today and your tests are completely normal, you will receive your results only by: [name] Kitchen MyChart Message (if you have MyChart) OR . A paper copy in the mail If you have any lab test that is abnormal or we need to change your treatment, we will call you to review the results.   Testing/Procedures: -None   Follow-Up: At [facility], you and your health needs are our priority.  As part of our continuing mission to provide you with exceptional heart care, we have created designated Provider Care Teams.  These Care Teams include your primary Cardiologist (physician) and Advanced Practice Providers (APPs -  Physician Assistants and Nurse Practitioners) who all work together to provide you with the care you need, when you need it.  We recommend signing up for the patient portal called "MyChart".  Sign up information is provided on this After Visit Summary.  MyChart is used to connect with patients for Virtual Visits (Telemedicine).  Patients are able to view lab/test results, encounter notes, upcoming appointments, etc.  Non-urgent messages can be sent to your provider as well.   To learn more about what you can do with MyChart, go to [email or web address].    Your next appointment:   3 month(s) on Wednesday, July 20 @ 10:15 am   The format for your next appointment:   In Person  Provider:   Richardson Dopp, PA-C   Other Instructions Weigh daily!!  Take extra dose of lasix ( 20 mg) if wt increased by 2 lbs in one day.

## 2020-11-04 NOTE — Patient Instructions (Addendum)
Veronica Shaw  it was nice speaking with you. Please call me directly [phone] if you have questions about the goals we discussed.  Goals Addressed            This Visit's Progress   . (THN)Track and Manage Activity and Exertion       Timeframe:  Long-Range Goal Priority:  Medium Start Date:   11/03/20                       Expected End Date 01/28/21                        Follow Up Date 11/17/20   - meet with physical therapist - pace activity allowing for rest    Why is this important?   Exercising is very important when managing your heart failure.  It will help your heart get stronger.    Notes:     [name] Kitchen Make and Keep All Appointments       Timeframe:  Long-Range Goal Priority:  Medium Start Date:    11/03/20                     Expected End Date:   6/30/222                     Follow Up Date 11/17/20   - call to cancel if needed - keep a calendar with prescription refill dates - keep a calendar with appointment dates    Why is this important?   Part of staying healthy is seeing the doctor for follow-up care.  If you forget your appointments, there are some things you can do to stay on track.    Notes:  Patient has been keeping her appointment    . Track and Manage Fluids and Swelling       Timeframe:  Long-Range Goal Priority:  High Start Date:   11/03/20                        Expected End Date:     01/28/2021                  Follow Up Date 11/17/20   - call office if I gain more than 2 pounds in one day or 5 pounds in one week - keep legs up while sitting - track weight in diary - use salt in moderation - watch for swelling in feet, ankles and legs every day - weigh myself daily    Why is this important?   It is important to check your weight daily and watch how much salt and liquids you have.  It will help you to manage your heart failure    . Track and Manage Symptoms       Timeframe:  Long-Range Goal Priority:  High Start Date:11/03/20                             Expected End Date: 01/28/21                   Follow Up Date 11/17/20   - develop a rescue plan - follow rescue plan if symptoms flare-up - know when to call the doctor - track symptoms and what helps feel better or worse    Why is this important?   You  will be able to handle your symptoms better if you keep track of them.  Making some simple changes to your lifestyle will help.  Eating healthy is one thing you can do to take good care of yourself.    Notes:        Patient Care Plan: General Plan of Care (Adult)    Problem Identified: Health Promotion or Disease Self-Management (General Plan of Care)   Priority: Medium  Onset Date: 07/29/2020    Long-Range Goal: Self-Management Plan Developed   Note:   Evidence-based guidance:  Review biopsychosocial determinants of health screens.  Determine level of modifiable health risk.  Assess level of patient activation, level of readiness, importance and confidence to make changes.  Evoke change talk using open-ended questions, pros and cons, as well as looking forward.  Identify areas where behavior change may lead to improved health.  Partner with patient to develop a robust self-management plan that includes lifestyle factors, such as weight loss, exercise and healthy nutrition, as well as goals specific to disease risks.  Support patient and family/caregiver active participation in decision-making and self-management plan.  Implement additional goals and interventions based on identified risk factors to reduce health risk.  Facilitate advance care planning.  Review need for preventive screening based on age, sex, family history and health history.   Notes:    Task: Mutually Develop and Royce Macadamia Achievement of Patient Goals   Due Date: 01/28/2021  Note:   Care Management Activities:    - decision-making supported - health risks reviewed - questions answered - reassurance provided - verbalization of feelings  encouraged    Notes:    Problem Identified: Quality of Life (General Plan of Care)     Long-Range Goal: Quality of Life Maintained   Start Date: 07/29/2020  Expected End Date: 01/28/2021  Priority: Medium  Note:   Evidence-based guidance:  Assess patient's thoughts about quality of life, goals and expectations, and dissatisfaction or desire to improve.  Identify issues of primary importance such as mental health, illness, exercise tolerance, pain, sexual function and intimacy, cognitive change, social isolation, finances and relationships.  Assess and monitor for signs/symptoms of psychosocial concerns, especially depression or ideations regarding harm to others or self; provide or refer for mental health services as needed.  Identify sensory issues that impact quality of life such as hearing loss, vision deficit; strategize ways to maintain or improve hearing, vision.  Promote access to services in the community to support independence such as support groups, home visiting programs, financial assistance, handicapped parking tags, durable medical equipment and emergency responder.  Promote activities to decrease social isolation such as group support or social, leisure and recreational activities, employment, use of social media; consider safety concerns about being out of home for activities.  Provide patient an opportunity to share by storytelling or a "life review" to give positive meaning to life and to assist with coping and negative experiences.  Encourage patient to tap into hope to improve sense of self.  Counsel based on prognosis and as early as possible about end-of-life and palliative care; consider referral to palliative care provider.  Advocate for the development of palliative care plan that may include avoidance of unnecessary testing and intervention, symptom control, discontinuation of medications, hospice and organ donation.  Counsel as early as possible those with  life-limiting chronic disease about palliative care; consider referral to palliative care provider.  Advocate for the development of palliative care plan.   Notes:    Task: Support  and Maintain Acceptable Degree of Health, Comfort and Happiness   Due Date: 07/31/2020  Note:   Care Management Activities:    - expression of thoughts about present/future encouraged - independence in all possible areas promoted - patient strengths promoted - self-expression encouraged - strategies to maintain hearing and/or vision promoted - wellness behaviors promoted    Notes:    Patient Care Plan: RN Case Manager    Problem Identified: Symptom Exacerbation (Heart Failure)     Long-Range Goal: Patient to manage and monitor signs and symptoms of Heart Failure   Start Date: 11/03/2020  Expected End Date: 01/28/2021  Priority: High  Note:   Current Barriers:  [name] Kitchen Knowledge deficit related to basic heart failure pathophysiology and self care management  Case Manager Clinical Goal(s):  . patient will verbalize understanding of Heart Failure Action Plan and when to call doctor  Interventions:  . Basic overview and discussion of  Heart Failure reviewed . Provided verbal education on low sodium diet . Provided education on low sodium diet in My Chart . Reviewed Heart Failure Action Plan  . Assessed need for readable accurate scales in home- . Advised patient to weigh each morning after emptying bladder . Discussed importance of daily weight and advised patient to weigh and record daily . Reviewed role of diuretics in prevention of fluid overload and management of heart failure-patient states that her granddaughter comes and fixes her medications for her for  weeks   Patient Goals/Self Care Activities:  . Takes Heart Failure Medications as prescribed . Weighs daily and record (notifying MD of 3 lb weight gain over night or 5 lb in a week) . Verbalizes understanding of and follows CHF Action  Plan . Adheres to low sodium diet       [name] received Care Management services today:  1. Care Management services include personalized support from designated clinical staff supervised by her physician, including individualized plan of care and coordination with other care providers 2. 24/7 contact [phone] for assistance for urgent and routine care needs. 3. Care Management are voluntary services and be declined at any time by calling the office.  Patient agreed to have a copy of infor mation through My chart and educational material.   Follow Up Plan: Patient would like continued follow-up.  CCM RNCM will outreach the patient within the next 14 days.  Patient will call office if needed prior to next encounter   Lazaro Arms, RN   [phone]  Heart Failure Action Plan A heart failure action plan helps you understand what to do when you have symptoms of heart failure. Your action plan is a color-coded plan that lists the symptoms to watch for and indicates what actions to take.  If you have symptoms in the red zone, you need medical care right away.  If you have symptoms in the yellow zone, you are having problems.  If you have symptoms in the green zone, you are doing well. Follow the plan that was created by you and your health care provider. Review your plan each time you visit your health care provider. Red zone These signs and symptoms mean you should get medical help right away:  You have trouble breathing when resting.  You have a dry cough that is getting worse.  You have swelling or pain in your legs or abdomen that is getting worse.  You suddenly gain more than 2-3 lb (0.9-1.4 kg) in 24 hours, or more than 5 lb (2.3 kg)  in a week. This amount may be more or less depending on your condition.  You have trouble staying awake or you feel confused.  You have chest pain.  You do not have an appetite.  You pass out.  You have worsening sadness or  depression. If you have any of these symptoms, call your local emergency services (911 in the [place] right away. Do not drive yourself to the hospital.   Yellow zone These signs and symptoms mean your condition may be getting worse and you should make some changes:  You have trouble breathing when you are active, or you need to sleep with your head raised on extra pillows to help you breathe.  You have swelling in your legs or abdomen.  You gain 2-3 lb (0.9-1.4 kg) in 24 hours, or 5 lb (2.3 kg) in a week. This amount may be more or less depending on your condition.  You get tired easily.  You have trouble sleeping.  You have a dry cough. If you have any of these symptoms:  Contact your health care provider within the next day.  Your health care provider may adjust your medicines.   Green zone These signs mean you are doing well and can continue what you are doing:  You do not have shortness of breath.  You have very little swelling or no new swelling.  Your weight is stable (no gain or loss).  You have a normal activity level.  You do not have chest pain or any other new symptoms.   Where to find more information  American Heart Association: www.heart.org Summary  A heart failure action plan helps you understand what to do when you have symptoms of heart failure.  Follow the action plan that was created by you and your health care provider.  Get help right away if you have any symptoms in the red zone. This information is not intended to replace advice given to you by your health care provider. Make sure you discuss any questions you have with your health care provider. Document Revised: 03/02/2020 Document Reviewed: 03/02/2020 Elsevier Patient Education  2021 [place] Eating Plan DASH stands for Dietary Approaches to Stop Hypertension. The DASH eating plan is a healthy eating plan that has been shown to:  Reduce high blood pressure  (hypertension).  Reduce your risk for type 2 diabetes, heart disease, and stroke.  Help with weight loss. What are tips for following this plan? Reading food labels  Check food labels for the amount of salt (sodium) per serving. Choose foods with less than 5 percent of the Daily Value of sodium. Generally, foods with less than 300 milligrams (mg) of sodium per serving fit into this eating plan.  To find whole grains, look for the word "whole" as the first word in the ingredient list. Shopping  Buy products labeled as "low-sodium" or "no salt added."  Buy fresh foods. Avoid canned foods and pre-made or frozen meals. Cooking  Avoid adding salt when cooking. Use salt-free seasonings or herbs instead of table salt or sea salt. Check with your health care provider or pharmacist before using salt substitutes.  Do not fry foods. Cook foods using healthy methods such as baking, boiling, grilling, roasting, and broiling instead.  Cook with heart-healthy oils, such as olive, canola, avocado, soybean, or sunflower oil. Meal planning  Eat a balanced diet that includes: ? 4 or more servings of fruits and 4 or more servings of vegetables each day. Try to  fill one-half of your plate with fruits and vegetables. ? 6-8 servings of whole grains each day. ? Less than 6 oz (170 g) of lean meat, poultry, or fish each day. A 3-oz (85-g) serving of meat is about the same size as a deck of cards. One egg equals 1 oz (28 g). ? 2-3 servings of low-fat dairy each day. One serving is 1 cup (237 mL). ? 1 serving of nuts, seeds, or beans 5 times each week. ? 2-3 servings of heart-healthy fats. Healthy fats called omega-3 fatty acids are found in foods such as walnuts, flaxseeds, fortified milks, and eggs. These fats are also found in cold-water fish, such as sardines, salmon, and mackerel.  Limit how much you eat of: ? Canned or prepackaged foods. ? Food that is high in trans fat, such as some fried  foods. ? Food that is high in saturated fat, such as fatty meat. ? Desserts and other sweets, sugary drinks, and other foods with added sugar. ? Full-fat dairy products.  Do not salt foods before eating.  Do not eat more than 4 egg yolks a week.  Try to eat at least 2 vegetarian meals a week.  Eat more home-cooked food and less restaurant, buffet, and fast food.   Lifestyle  When eating at a restaurant, ask that your food be prepared with less salt or no salt, if possible.  If you drink alcohol: ? Limit how much you use to:  0-1 drink a day for women who are not pregnant.  0-2 drinks a day for men. ? Be aware of how much alcohol is in your drink. In the [place] one drink equals one 12 oz bottle of beer (355 mL), one 5 oz glass of wine (148 mL), or one 1 oz glass of hard liquor (44 mL). General information  Avoid eating more than 2,300 mg of salt a day. If you have hypertension, you may need to reduce your sodium intake to 1,500 mg a day.  Work with your health care provider to maintain a healthy body weight or to lose weight. Ask what an ideal weight is for you.  Get at least 30 minutes of exercise that causes your heart to beat faster (aerobic exercise) most days of the week. Activities may include walking, swimming, or biking.  Work with your health care provider or dietitian to adjust your eating plan to your individual calorie needs. What foods should I eat? Fruits All fresh, dried, or frozen fruit. Canned fruit in natural juice (without added sugar). Vegetables Fresh or frozen vegetables (raw, steamed, roasted, or grilled). Low-sodium or reduced-sodium tomato and vegetable juice. Low-sodium or reduced-sodium tomato sauce and tomato paste. Low-sodium or reduced-sodium canned vegetables. Grains Whole-grain or whole-wheat bread. Whole-grain or whole-wheat pasta. Brown rice. [place]. Bulgur. Whole-grain and low-sodium cereals. Pita bread. Low-fat, low-sodium crackers.  Whole-wheat flour tortillas. Meats and other proteins Skinless chicken or [place]. Ground chicken or [place]. Pork with fat trimmed off. Fish and seafood. Egg whites. Dried beans, peas, or lentils. Unsalted nuts, nut butters, and seeds. Unsalted canned beans. Lean cuts of beef with fat trimmed off. Low-sodium, lean precooked or cured meat, such as sausages or meat loaves. Dairy Low-fat (1%) or fat-free (skim) milk. Reduced-fat, low-fat, or fat-free cheeses. Nonfat, low-sodium ricotta or cottage cheese. Low-fat or nonfat yogurt. Low-fat, low-sodium cheese. Fats and oils Soft margarine without trans fats. Vegetable oil. Reduced-fat, low-fat, or light mayonnaise and salad dressings (reduced-sodium). Canola, safflower, olive, avocado, soybean, and sunflower oils.  Avocado. Seasonings and condiments Herbs. Spices. Seasoning mixes without salt. Other foods Unsalted popcorn and pretzels. Fat-free sweets. The items listed above may not be a complete list of foods and beverages you can eat. Contact a dietitian for more information. What foods should I avoid? Fruits Canned fruit in a light or heavy syrup. Fried fruit. Fruit in cream or butter sauce. Vegetables Creamed or fried vegetables. Vegetables in a cheese sauce. Regular canned vegetables (not low-sodium or reduced-sodium). Regular canned tomato sauce and paste (not low-sodium or reduced-sodium). Regular tomato and vegetable juice (not low-sodium or reduced-sodium). Angie Fava. Olives. Grains Baked goods made with fat, such as croissants, muffins, or some breads. Dry pasta or rice meal packs. Meats and other proteins Fatty cuts of meat. Ribs. Fried meat. Berniece Salines. Bologna, salami, and other precooked or cured meats, such as sausages or meat loaves. Fat from the back of a pig (fatback). Bratwurst. Salted nuts and seeds. Canned beans with added salt. Canned or smoked fish. Whole eggs or egg yolks. Chicken or [place] with skin. Dairy Whole or 2% milk, cream, and  half-and-half. Whole or full-fat cream cheese. Whole-fat or sweetened yogurt. Full-fat cheese. Nondairy creamers. Whipped toppings. Processed cheese and cheese spreads. Fats and oils Butter. Stick margarine. Lard. Shortening. Ghee. Bacon fat. Tropical oils, such as coconut, palm kernel, or palm oil. Seasonings and condiments Onion salt, garlic salt, seasoned salt, table salt, and sea salt. Worcestershire sauce. Tartar sauce. Barbecue sauce. Teriyaki sauce. Soy sauce, including reduced-sodium. Steak sauce. Canned and packaged gravies. Fish sauce. Oyster sauce. Cocktail sauce. Store-bought horseradish. Ketchup. Mustard. Meat flavorings and tenderizers. Bouillon cubes. Hot sauces. Pre-made or packaged marinades. Pre-made or packaged taco seasonings. Relishes. Regular salad dressings. Other foods Salted popcorn and pretzels. The items listed above may not be a complete list of foods and beverages you should avoid. Contact a dietitian for more information. Where to find more information  National Heart, Lung, and Blood Institute: https://wilson-eaton.com/  American Heart Association: www.heart.org  Academy of Nutrition and Dietetics: www.eatright.[place]: www.kidney.org Summary  The DASH eating plan is a healthy eating plan that has been shown to reduce high blood pressure (hypertension). It may also reduce your risk for type 2 diabetes, heart disease, and stroke.  When on the DASH eating plan, aim to eat more fresh fruits and vegetables, whole grains, lean proteins, low-fat dairy, and heart-healthy fats.  With the DASH eating plan, you should limit salt (sodium) intake to 2,300 mg a day. If you have hypertension, you may need to reduce your sodium intake to 1,500 mg a day.  Work with your health care provider or dietitian to adjust your eating plan to your individual calorie needs. This information is not intended to replace advice given to you by your health care provider. Make  sure you discuss any questions you have with your health care provider. Document Revised: 06/21/2019 Document Reviewed: 06/21/2019 Elsevier Patient Education  2021 [employer].

## 2020-11-04 NOTE — Chronic Care Management (AMB) (Signed)
Care Management    RN Visit Note  11/04/2020 Name: Veronica Shaw MRN: [id number] DOB: [birth year]  Subjective: Veronica Shaw is a 70 y.o. year old female who is a primary care patient of [name], DO. The care management team was consulted for assistance with disease management and care coordination needs.    Engaged with patient by telephone for initial visit in response to provider referral for case management and/or care coordination services.   Consent to Services:   Ms. [name] was given information about Care Management services today including:  1. Care Management services includes personalized support from designated clinical staff supervised by her physician, including individualized plan of care and coordination with other care providers 2. 24/7 contact phone numbers for assistance for urgent and routine care needs. 3. The patient may stop case management services at any time by phone call to the office staff.  Patient agreed to services and consent obtained.    Assessment: Patient is currently experiencing difficulty with some dizziness.  She reports that she took one her meclizines to help... See Care Plan below for interventions and patient self-care actives. Follow up Plan: Patient would like continued follow-up.  CCM RNCM will outreach the patient within the next 14 day.  Patient will call office if needed prior to next encounter  Review of patient past medical history, allergies, medications, health status, including review of consultants reports, laboratory and other test data, was performed as part of comprehensive evaluation and provision of chronic care management services.   SDOH (Social Determinants of Health) assessments and interventions performed:    Care Plan  Allergies  Allergen Reactions  . Aspirin Swelling and Other (See Comments)    Other reaction(s): Other (See Comments) Causes nose bleeds *ONLY THE COATED ASA* Causes nose  bleeds *ONLY THE COATED ASA*  . Penicillins Shortness Of Breath and Rash    Shortness of Breath - Throat felt like it was closing.   [name] [Conj Estrog-Medroxyprogest Ace] Shortness Of Breath    Throat swelling Throat swelling  . Ace Inhibitors Cough  . Simvastatin Other (See Comments) and Rash    Muscle aches    Outpatient Encounter Medications as of 11/03/2020  Medication Sig  . APPLE CIDER VINEGAR PO Take 1 capsule by mouth daily.  . Biotin 5000 MCG CAPS Take 1 tablet by mouth daily.   . budesonide-formoterol (SYMBICORT) 160-4.5 MCG/ACT inhaler Inhale 2 puffs into the lungs 2 (two) times daily.  . carbidopa-levodopa (SINEMET IR) 25-100 MG tablet TAKE 1 TABLET BY MOUTH THREE TIMES DAILY  . famotidine (PEPCID) 20 MG tablet TAKE 1 TABLET TWICE DAILY  . fluticasone (FLONASE SENSIMIST) 27.5 MCG/SPRAY nasal spray Place 2 sprays into the nose daily.  . furosemide (LASIX) 40 MG tablet Take 1 tablet (40 mg total) by mouth daily.  [name] Kitchen levalbuterol (XOPENEX) 0.63 MG/3ML nebulizer solution USE 1 VIAL IN NEBULIZER EVERY 4 TO 6 HOURS AS NEEDED FOR WHEEZING AND FOR SHORTNESS OF BREATH  . losartan (COZAAR) 25 MG tablet TAKE 1 TABLET EVERY DAY  . meclizine (ANTIVERT) 12.5 MG tablet Take 1 tablet (12.5 mg total) by mouth daily as needed for dizziness. Should not be long term.  . melatonin 5 MG TABS Take 5 mg by mouth at bedtime.  . metFORMIN (GLUCOPHAGE) 500 MG tablet Take 2 tablets (1,000 mg total) by mouth 2 (two) times daily with a meal.  . metoprolol succinate (TOPROL-XL) 50 MG 24 hr tablet TAKE 1 TABLET TWICE DAILY  .  OXYGEN Inhale into the lungs at bedtime. Inhale 2L into lungs  . potassium chloride (KLOR-CON) 10 MEQ tablet Take 10 mEq by mouth daily as needed (low potassium).  [name] Kitchen spironolactone (ALDACTONE) 25 MG tablet Take 0.5 tablets (12.5 mg total) by mouth daily.  [name] Kitchen Ubiquinone (ULTRA COQ10 PO) Take 1 tablet by mouth 3 (three) times a week.   No facility-administered encounter medications  on file as of 11/03/2020.    Patient Active Problem List   Diagnosis Date Noted  . Acute on chronic systolic CHF (congestive heart failure), NYHA class 4 ([place]) 10/19/2020  . Chronic HFrEF (heart failure with reduced ejection fraction) ([place])   . Cervical dystonia 10/07/2020  . Advanced care planning/counseling discussion 08/21/2020  . Dementia without behavioral disturbance ([place]) 08/21/2020  . Vision impairment, Right Eye 08/21/2020  . Chronic kidney disease, stage 3a ([place]) 08/18/2020  . Frequent urination 07/30/2020  . Chronic rhinitis 12/18/2019  . Chronic respiratory failure with hypoxia ([place]) 12/18/2019  . Hyperlipidemia associated with type 2 diabetes mellitus ([place]) 03/05/2019  . Falls frequently   . Osteopenia 07/10/2015  . Mild persistent asthma in adult without complication 41/32/4401  . NICM (nonischemic cardiomyopathy) ([place]) 04/23/2014  . Arthritis or polyarthritis, rheumatoid ([place]) 03/12/2014  . ICD (implantable cardioverter-defibrillator), dual, in situ 03/06/2014  . Heart Failure with Recovered Ejection Fraction, G2DD 05/31/2013  . Parkinson disease ([place]) 05/21/2013  . Vertigo 03/03/2013  . Gastroesophageal reflux disease without esophagitis 09/27/2012  . Dyspnea 09/19/2012  . Trigger point with neck pain 04/12/2012  . 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  . Diabetic peripheral neuropathy ([place]) 12/09/2011  . Chronic urticaria 05/27/2011  . Sinus tachycardia ([place]) 05/27/2011  . 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 05/20/2011  . Type II diabetes mellitus with complication ([place]) 02/72/5366  . Hypertension associated with diabetes ([place]) 09/28/2006    Conditions to be addressed/monitored: CHF   Patient Care Plan: RN Case Manager  Problem Identified: Symptom Exacerbation (Heart Failure)   Long-Range Goal: Patient to manage and monitor signs and symptoms of Heart Failure   Start Date: 11/03/2020  Expected End Date: 01/28/2021  Priority: High  Current Barriers:  [name] Kitchen Knowledge deficit  related to basic heart failure pathophysiology and self care management  Case Manager Clinical Goal(s):  . patient will verbalize understanding of Heart Failure Action Plan and when to call doctor  Interventions:  . Basic overview and discussion of  Heart Failure reviewed . Provided verbal education on low sodium diet . Provided education on low sodium diet in My Chart . Reviewed Heart Failure Action Plan  . Assessed need for readable accurate scales in home- . Advised patient to weigh each morning after emptying bladder . Discussed importance of daily weight and advised patient to weigh and record daily . Reviewed role of diuretics in prevention of fluid overload and management of heart failure-patient states that her granddaughter comes and fixes her medications for her for  weeks   Patient Goals/Self Care Activities:  . Takes Heart Failure Medications as prescribed . Weighs daily and record (notifying MD of 3 lb weight gain over night or 5 lb in a week) . Verbalizes understanding of and follows CHF Action Plan . Adheres to low sodium diet        Lazaro Arms RN, BSN, [place] Management Coordinator [place] Phone: [phone] I Fax: [phone]

## 2020-11-05 LAB — CBC
Hematocrit: 39.6 % (ref 34.0–46.6)
Hemoglobin: 13.3 g/dL (ref 11.1–15.9)
MCH: 29.8 pg (ref 26.6–33.0)
MCHC: 33.6 g/dL (ref 31.5–35.7)
MCV: 89 fL (ref 79–97)
Platelets: 222 10*3/uL (ref 150–450)
RBC: 4.46 x10E6/uL (ref 3.77–5.28)
RDW: 12.2 % (ref 11.7–15.4)
WBC: 7.5 10*3/uL (ref 3.4–10.8)

## 2020-11-05 LAB — BASIC METABOLIC PANEL
BUN/Creatinine Ratio: 21 (ref 12–28)
BUN: 25 mg/dL (ref 8–27)
CO2: 21 mmol/L (ref 20–29)
Calcium: 10.5 mg/dL — ABNORMAL HIGH (ref 8.7–10.3)
Chloride: 97 mmol/L (ref 96–106)
Creatinine, Ser: 1.2 mg/dL — ABNORMAL HIGH (ref 0.57–1.00)
Glucose: 143 mg/dL — ABNORMAL HIGH (ref 65–99)
Potassium: 5 mmol/L (ref 3.5–5.2)
Sodium: 138 mmol/L (ref 134–144)
eGFR: 48 mL/min/{1.73_m2} — ABNORMAL LOW (ref >59)

## 2020-11-17 ENCOUNTER — Ambulatory Visit: Payer: Medicare Other

## 2020-11-17 ENCOUNTER — Ambulatory Visit (INDEPENDENT_AMBULATORY_CARE_PROVIDER_SITE_OTHER): Payer: Medicare Other

## 2020-11-17 DIAGNOSIS — I5022 Chronic systolic (congestive) heart failure: Secondary | ICD-10-CM | POA: Diagnosis not present

## 2020-11-17 DIAGNOSIS — Z95810 Presence of automatic (implantable) cardiac defibrillator: Secondary | ICD-10-CM

## 2020-11-17 NOTE — Chronic Care Management (AMB) (Signed)
Care Management    RN Visit Note  11/17/2020 Name: Veronica Shaw MRN: [id number] DOB: 31-Oct-1950  Subjective: Veronica Shaw is a 70 y.o. year old female who is a primary care patient of [name], DO. The care management team was consulted for assistance with disease management and care coordination needs.    Engaged with patient by telephone for follow up visit in response to provider referral for case management and/or care coordination services.   The patient was given information about Chronic Care Management services, agreed to services, and gave verbal consent prior to initiation of services.  Please see initial visit note for detailed documentation.  Patient agreed to services and consent obtained.    Assessment: The patient did have a problem with some swelling in her left leg.  It was due to some salty food that she had eaten.  After taking her medication it was resolved then next day. She understands to monitor her foods better.. See Care Plan below for interventions and patient self-care actives. Follow up Plan: Patient would like continued follow-up.  CCM RNCM will outreach the patient within the next 5 weeks.  Patient will call office if needed prior to next encounter  Review of patient past medical history, allergies, medications, health status, including review of consultants reports, laboratory and other test data, was performed as part of comprehensive evaluation and provision of chronic care management services.   SDOH (Social Determinants of Health) assessments and interventions performed:    Care Plan  Allergies  Allergen Reactions  . Aspirin Swelling and Other (See Comments)    Other reaction(s): Other (See Comments) Causes nose bleeds *ONLY THE COATED ASA* Causes nose bleeds *ONLY THE COATED ASA*  . Penicillins Shortness Of Breath and Rash    Shortness of Breath - Throat felt like it was closing.   [name] [Conj Estrog-Medroxyprogest Ace]  Shortness Of Breath    Throat swelling Throat swelling  . Ace Inhibitors Cough  . Simvastatin Other (See Comments) and Rash    Muscle aches    Outpatient Encounter Medications as of 11/17/2020  Medication Sig  . APPLE CIDER VINEGAR PO Take 1 capsule by mouth daily.  . Biotin 5000 MCG CAPS Take 1 tablet by mouth daily.   . budesonide-formoterol (SYMBICORT) 160-4.5 MCG/ACT inhaler Inhale 2 puffs into the lungs 2 (two) times daily.  . carbidopa-levodopa (SINEMET IR) 25-100 MG tablet TAKE 1 TABLET BY MOUTH THREE TIMES DAILY  . famotidine (PEPCID) 20 MG tablet TAKE 1 TABLET TWICE DAILY  . fluticasone (FLONASE SENSIMIST) 27.5 MCG/SPRAY nasal spray Place 2 sprays into the nose daily.  . furosemide (LASIX) 40 MG tablet Take 1 tablet (40 mg total) by mouth daily.  [name] Kitchen levalbuterol (XOPENEX) 0.63 MG/3ML nebulizer solution USE 1 VIAL IN NEBULIZER EVERY 4 TO 6 HOURS AS NEEDED FOR WHEEZING AND FOR SHORTNESS OF BREATH  . losartan (COZAAR) 25 MG tablet TAKE 1 TABLET EVERY DAY  . meclizine (ANTIVERT) 12.5 MG tablet Take 1 tablet (12.5 mg total) by mouth daily as needed for dizziness. Should not be long term.  . melatonin 5 MG TABS Take 5 mg by mouth at bedtime.  . metFORMIN (GLUCOPHAGE) 500 MG tablet Take 2 tablets (1,000 mg total) by mouth 2 (two) times daily with a meal.  . metoprolol succinate (TOPROL-XL) 50 MG 24 hr tablet TAKE 1 TABLET TWICE DAILY  . OXYGEN Inhale into the lungs at bedtime. Inhale 2L into lungs  . potassium chloride (KLOR-CON) 10  MEQ tablet Take 10 mEq by mouth daily as needed (low potassium).  [name] Kitchen spironolactone (ALDACTONE) 25 MG tablet Take 0.5 tablets (12.5 mg total) by mouth daily.  [name] Kitchen Ubiquinone (ULTRA COQ10 PO) Take 1 tablet by mouth 3 (three) times a week.   No facility-administered encounter medications on file as of 11/17/2020.    Patient Active Problem List   Diagnosis Date Noted  . Acute on chronic systolic CHF (congestive heart failure), NYHA class 4 ([place]) 10/19/2020  .  Chronic HFrEF (heart failure with reduced ejection fraction) ([place])   . Cervical dystonia 10/07/2020  . Advanced care planning/counseling discussion 08/21/2020  . Dementia without behavioral disturbance ([place]) 08/21/2020  . Vision impairment, Right Eye 08/21/2020  . Chronic kidney disease, stage 3a ([place]) 08/18/2020  . Frequent urination 07/30/2020  . Chronic rhinitis 12/18/2019  . Chronic respiratory failure with hypoxia ([place]) 12/18/2019  . Hyperlipidemia associated with type 2 diabetes mellitus ([place]) 03/05/2019  . Falls frequently   . Osteopenia 07/10/2015  . Mild persistent asthma in adult without complication 23/76/2831  . NICM (nonischemic cardiomyopathy) ([place]) 04/23/2014  . Arthritis or polyarthritis, rheumatoid ([place]) 03/12/2014  . ICD (implantable cardioverter-defibrillator), dual, in situ 03/06/2014  . Heart Failure with Recovered Ejection Fraction, G2DD 05/31/2013  . Parkinson disease ([place]) 05/21/2013  . Vertigo 03/03/2013  . Gastroesophageal reflux disease without esophagitis 09/27/2012  . Dyspnea 09/19/2012  . Trigger point with neck pain 04/12/2012  . 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  . Diabetic peripheral neuropathy ([place]) 12/09/2011  . Chronic urticaria 05/27/2011  . Sinus tachycardia ([place]) 05/27/2011  . 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 05/20/2011  . Type II diabetes mellitus with complication ([place]) 51/76/1607  . Hypertension associated with diabetes ([place]) 09/28/2006    Conditions to be addressed/monitored: CHF  Care Plan : RN Case Manager  Updates made by Lazaro Arms, RN since 11/17/2020 12:00 AM  Problem: Symptom Exacerbation (Heart Failure)   Long-Range Goal: Patient to manage and monitor signs and symptoms of Heart Failure   Start Date: 11/03/2020  Expected End Date: 01/28/2021  Priority: High  Note:   Current Barriers:  [name] Kitchen Knowledge deficit related to basic heart failure pathophysiology and self care management  Case Manager Clinical Goal(s):  . patient will  verbalize understanding of Heart Failure Action Plan and when to call doctor  Interventions:  . Basic overview and discussion of  Heart Failure reviewed . Provided verbal education on low sodium diet . Provided education on low sodium diet in My Chart . Reviewed Heart Failure Action Plan  . Assessed need for readable accurate scales in home- . Advised patient to weigh each morning after emptying bladder . Discussed importance of daily weight and advised patient to weigh and record daily-patient's weight today was 133 lbs. . Reviewed role of diuretics in prevention of fluid overload and management of heart failure-patient states that her granddaughter fixes her medications for her for  weeks . Patient states that yesterday she had some issues with swelling in her left leg and shortness of breath.  She felt it was due to a dinner that they had at [employer] yesterday.  She said the meat was very salty. Today she is feeling better, no chest pain or shortness of breath. . We discussed monitoring the salt in her diet,  and taking her medication as prescribe.  The patient verbalized understanding.  She received the educational material and has been reading it on occasion.  No questions at this time. . The patient reports that her and her  husband recieved their 2nd booster shot yesterday.    Patient Goals/Self Care Activities:  . Takes Heart Failure Medications as prescribed . Weighs daily and record (notifying MD of 3 lb weight gain over night or 5 lb in a week) . Verbalizes understanding of and follows CHF Action Plan . Adheres to low sodium diet      Lazaro Arms RN, BSN, [place] Management Coordinator [place] Phone: [phone] I Fax: [phone]

## 2020-11-17 NOTE — Patient Instructions (Signed)
Visit Information  [name]  it was nice speaking with you. Please call me directly [phone] if you have questions about the goals we discussed.  Goals Addressed            This Visit's Progress   . COMPLETED: (THN)Track and Manage Activity and Exertion       Timeframe:  Long-Range Goal Priority:  Medium Start Date:   11/03/20                       Expected End Date 01/28/21                          - meet with physical therapist - pace activity allowing for rest    Why is this important?   Exercising is very important when managing your heart failure.  It will help your heart get stronger.    Notes:     . COMPLETED: Client verbalize knowledge of Heart Failure disease self management skills by monitoring weight, medication adherence and low sodium diet       CARE PLAN ENTRY (see longtitudinal plan of care for additional care plan information)   Current Barriers:  [name] Kitchen Knowledge deficit related to basic heart failure pathophysiology and self care management . Knowledge Deficits related to heart failure medications . Cognitive Deficits  Case Manager Clinical Goal(s):  [name] Kitchen Over the next 90 days, patient will verbalize understanding of Heart Failure Action Plan and when to call doctor . Over the next 90 days, patient will take all Heart Failure mediations as prescribed . Over the next 90 days, patient will weigh daily and record (notifying MD of 3 lb weight gain over night or 5 lb in a week) . Patient will verbalize two symptoms of CHF exacerbation within the next 90 days.   . Patient will verbalize receiving annual flu vaccine within the next 90 days.   Interventions:  . Basic overview and discussion of pathophysiology of Heart Failure reviewed  . Provided verbal education on low sodium diet . Provided written education on low sodium diet . Advised patient to weigh each morning after emptying bladder . Discussed importance of daily weight and advised patient to weigh  and record daily . Reviewed role of diuretics in prevention of fluid overload and management of heart failure . Discussed all medications that patient is taking and their purpose  Patient Self Care Activities:  . Takes Heart Failure Medications as prescribed . Weighs daily and record (notifying MD of 3 lb weight gain over night or 5 lb in a week) . Verbalizes understanding of and follows CHF Action Plan . Adheres to low sodium diet  Initial goal documentation     . COMPLETED: Make and Keep All Appointments       Timeframe:  Long-Range Goal Priority:  Medium Start Date:    11/03/20                     Expected End Date:   6/30/222                       - call to cancel if needed - keep a calendar with prescription refill dates - keep a calendar with appointment dates    Why is this important?   Part of staying healthy is seeing the doctor for follow-up care.  If you forget your appointments, there are some things you can  do to stay on track.    Notes:  Patient has been keeping her appointment    . Track and Manage Fluids and Swelling       Timeframe:  Long-Range Goal Priority:  High Start Date:   11/03/20                        Expected End Date:     01/28/2021                  Follow Up Date 12/22/20   - call office if I gain more than 2 pounds in one day or 5 pounds in one week - keep legs up while sitting - track weight in diary - use salt in moderation - watch for swelling in feet, ankles and legs every day - weigh myself daily    Why is this important?   It is important to check your weight daily and watch how much salt and liquids you have.  It will help you to manage your heart failure    . Track and Manage Symptoms       Timeframe:  Long-Range Goal Priority:  High Start Date:11/03/20                            Expected End Date: 01/28/21                   Follow Up Date 12/22/20   - develop a rescue plan - follow rescue plan if symptoms flare-up - know when  to call the doctor - track symptoms and what helps feel better or worse    Why is this important?   You will be able to handle your symptoms better if you keep track of them.  Making some simple changes to your lifestyle will help.  Eating healthy is one thing you can do to take good care of yourself.    Notes:        The patient verbalized understanding of instructions, educational materials, and care plan provided today and declined offer to receive copy of patient instructions, educational materials, and care plan.   Follow up Plan: Patient would like continued follow-up.  CCM RNCM will outreach the patient within the next 5 weeks  Patient will call office if needed prior to next encounter  Lazaro Arms, RN  [phone]

## 2020-11-20 NOTE — Progress Notes (Signed)
EPIC Encounter for ICM Monitoring  Patient Name: Veronica Shaw is a 70 y.o. female Date: 11/20/2020 Primary Care Physican: [name], DO Primary Cardiologist:Cooper/Weaver PA Electrophysiologist: Lovena Le 3/9/2022OfficeWeight: 137lbs  Spoke with patient and reports feeling well at this time.  Denies fluid symptoms.    OptiVolThoracic impedancenormal.  Prescribed:Furosemide40 mgTake 1 tablet (40 mg total) by mouth daily.   Labs: 11/04/2020 Creatinine 1.20, BUN 25, Potassium 5.0, Sodium 138, GFR 48 10/18/2020 Creatinine 1.04, BUN 19, Potassium 4.0, Sodium 136  10/17/2020 Creatinine 1.09, BUN 19, Potassium 4.4, Sodium 137  A complete set of results can be found in Results Review.  Recommendations: No changes and encouraged to call if experiencing any fluid symptoms.  Follow-up plan: ICM clinic phone appointment on5/31/2022. 91 day device clinic remote transmission4/28/2022.   EP/Cardiology Office Visits: 02/17/2021 with Richardson Dopp, PA.  Recalls 4/2/2022with Dr. Burt Knack and 02/05/2021 for [name].   Copy of ICM check sent to Dr.Taylor  3 month ICM trend: 11/17/2020.    1 Year ICM trend:       [name], RN 11/20/2020 5:08 PM

## 2020-11-25 ENCOUNTER — Encounter: Payer: Self-pay | Admitting: Physical Medicine and Rehabilitation

## 2020-11-25 ENCOUNTER — Other Ambulatory Visit: Payer: Self-pay

## 2020-11-25 ENCOUNTER — Encounter
Payer: Medicare Other | Attending: Physical Medicine and Rehabilitation | Admitting: Physical Medicine and Rehabilitation

## 2020-11-25 VITALS — BP 115/72 | HR 80 | Temp 99.0°F | Ht 61.5 in | Wt 134.6 lb

## 2020-11-25 DIAGNOSIS — M7918 Myalgia, other site: Secondary | ICD-10-CM | POA: Diagnosis not present

## 2020-11-25 DIAGNOSIS — G243 Spasmodic torticollis: Secondary | ICD-10-CM | POA: Insufficient documentation

## 2020-11-25 NOTE — Patient Instructions (Signed)
Pt is a 70 yr old female with hx of NICM, sCHF, DM- !c 7.2, Parkinsons' disease, CKD, and asthma and DJD/arthritis here for neck pain.  Of note, not on any blood thinners. Here for f/u on  Cervical dystonia with myofascial pain.    1. Patient here for trigger point injections for  Consent done and on chart.  Cleaned areas with alcohol and injected using a 27 gauge 1.5 inch needle  Injected  2cc total Using 1% Lidocaine with no EPI  Upper traps Levators Posterior scalenes B/L Middle scalenes B/L Splenius Capitus Pectoralis Major Rhomboids Infraspinatus Teres Major/minor Thoracic paraspinals Lumbar paraspinals Other injections-    Patient's level of pain prior was only with turning head to 1 side- had 2/10 Current level of pain after injections is- improved ROM- and pain "very little"  There was no bleeding or complications.  Patient was advised to drink a lot of water on day after injections to flush system Will have increased soreness for 12-48 hours after injections.  Can use Lidocaine patches the day AFTER injections Can use theracane on day of injections in places didn't inject Can use heating pad 4-6 hours AFTER injections  2. Don't hold fluid pill- drink an extra 8 oz today to compensate for getting injections- don't want to send into fluid overload.   3. F/U in 2 months or so for injections.   4. Will focus on sciatica and nerve pain at next appointment-

## 2020-11-25 NOTE — Progress Notes (Signed)
Subjective:    Patient ID: Veronica Shaw, female    DOB: 1951-04-23, 70 y.o.   MRN: [id number]  HPI   Pt is a 70 yr old female with hx of NICM, sCHF, DM- !c 7.2, Parkinsons' disease, CKD, and asthma and DJD/arthritis here for neck pain.  Of note, not on any blood thinners. Here for f/u on  Cervical dystonia with myofascial pain.   Wednesday had Trigger points Thursday had Laser procedure- 7 times Friday- had severe HA-  Saturday- Heart racing-and felt awful - B/L jaw pain. Was admitted to hospital for Sinus tachycardia- to 130s.    Went to church service the next week- got "healing from them"- Hasn't used cane or RW since- went up 10 steps at niece's house with no issues and dribbling basketball-   Pain is much better ever since.       Pain Inventory Average Pain 1 Pain Right Now 1 My pain is tingling  In the last 24 hours, has pain interfered with the following? General activity 0 Relation with others 0 Enjoyment of life 0 What TIME of day is your pain at its worst? varies Sleep (in general) Good  Pain is worse with: some movement Pain improves with: pacing activities Relief from Meds: na  Family History  Problem Relation Age of Onset  . Coronary artery disease Father        Died age 16  . Heart attack Father   . Diabetes Father   . Coronary artery disease Mother        Died age 69  . Heart attack Mother   . Diabetes Mother   . Parkinson's disease Sister   . Heart disease Sister   . Hepatitis C Sister   . Diabetes Sister   . Diabetes Son 37       T1DM  . Healthy Daughter   . Diabetes Sister   . Heart disease Sister   . Diabetes Sister   . Heart disease Sister   . Breast cancer Neg Hx    Social History   Socioeconomic History  . Marital status: Married    Spouse name: Veronica Shaw   . Number of children: 2  . Years of education: 80  . Highest education level: 12th grade  Occupational History  . Occupation: retired    Comment: CNA  Tobacco  Use  . Smoking status: Never Smoker  . Smokeless tobacco: Never Used  . Tobacco comment: exposed to smoke during child hood (parents)  Vaping Use  . Vaping Use: Never used  Substance and Sexual Activity  . Alcohol use: No    Alcohol/week: 0.0 standard drinks  . Drug use: No  . Sexual activity: Yes    Birth control/protection: Post-menopausal  Other Topics Concern  . Not on file  Social History Narrative   Patient lives with her husband Veronica Shaw.   Patient enjoys spending time with her family, cooking, and her 2 dogs.    Patient has one daughter who lives locally, and one son who lives in [place].   Social Determinants of Health   Financial Resource Strain: Low Risk   . Difficulty of Paying Living Expenses: Not hard at all  Food Insecurity: No Food Insecurity  . Worried About Charity fundraiser in the Last Year: Never true  . Ran Out of Food in the Last Year: Never true  Transportation Needs: No Transportation Needs  . Lack of Transportation (Medical): No  . Lack of Transportation (Non-Medical): No  Physical Activity: Sufficiently Active  . Days of Exercise per Week: 7 days  . Minutes of Exercise per Session: 30 min  Stress: No Stress Concern Present  . Feeling of Stress : Not at all  Social Connections: Moderately Integrated  . Frequency of Communication with Friends and Family: More than three times a week  . Frequency of Social Gatherings with Friends and Family: More than three times a week  . Attends Religious Services: More than 4 times per year  . Active Member of Clubs or Organizations: No  . Attends Archivist Meetings: Never  . Marital Status: Married   Past Surgical History:  Procedure Laterality Date  . BREAST EXCISIONAL BIOPSY Left 1970   benign cyst  . BREAST SURGERY Left   . BUNIONECTOMY    . CARDIAC CATHETERIZATION N/A 12/16/2015   Procedure: Right Heart Cath;  Surgeon: Sherren Mocha, MD;  Location: [facility];  Service: Cardiovascular;   Laterality: N/A;  . CHOLECYSTECTOMY    . eye lid surgery Bilateral 05/09/2019  . IMPLANTABLE CARDIOVERTER DEFIBRILLATOR IMPLANT  11-25-13   MDT dual chamber ICD implanted by [name] for primary prevention  . IMPLANTABLE CARDIOVERTER DEFIBRILLATOR IMPLANT N/A 11/25/2013   Procedure: IMPLANTABLE CARDIOVERTER DEFIBRILLATOR IMPLANT;  Surgeon: [name], MD;  Location: [facility];  Service: Cardiovascular;  Laterality: N/A;  . LEFT AND RIGHT HEART CATHETERIZATION WITH CORONARY ANGIOGRAM N/A 05/31/2013   Procedure: LEFT AND RIGHT HEART CATHETERIZATION WITH CORONARY ANGIOGRAM;  Surgeon: [name], MD;  Location: [facility] C[facility];  Service: Cardiovascular;  Laterality: N/A;  . TONSILLECTOMY    . TUBAL LIGATION     Past Surgical History:  Procedure Laterality Date  . BREAST EXCISIONAL BIOPSY Left 1970   benign cyst  . BREAST SURGERY Left   . BUNIONECTOMY    . CARDIAC CATHETERIZATION N/A 12/16/2015   Procedure: Right Heart Cath;  Surgeon: Sherren Mocha, MD;  Location: [place] [facility];  Service: Cardiovascular;  Laterality: N/A;  . CHOLECYSTECTOMY    . eye lid surgery Bilateral 05/09/2019  . IMPLANTABLE CARDIOVERTER DEFIBRILLATOR IMPLANT  11-25-13   MDT dual chamber ICD implanted by [name] for primary prevention  . IMPLANTABLE CARDIOVERTER DEFIBRILLATOR IMPLANT N/A 11/25/2013   Procedure: IMPLANTABLE CARDIOVERTER DEFIBRILLATOR IMPLANT;  Surgeon: [name], MD;  Location: [facility] CATH LAB;  Service: Cardiovascular;  Laterality: N/A;  . LEFT AND RIGHT HEART CATHETERIZATION WITH CORONARY ANGIOGRAM N/A 05/31/2013   Procedure: LEFT AND RIGHT HEART CATHETERIZATION WITH CORONARY ANGIOGRAM;  Surgeon: [name], MD;  Location: [facility] CATH LAB;  Service: Cardiovascular;  Laterality: N/A;  . TONSILLECTOMY    . TUBAL LIGATION     Past Medical History:  Diagnosis Date  . Acute on chronic systolic CHF (congestive heart failure), NYHA class 4 ([employer]) 10/19/2020  . Asthma   . Back pain 09/18/2019   . Cervical disc disorder with radiculopathy of cervical region 09/24/2018  . Chronic systolic CHF (congestive heart failure) ([place])    a. cMRI 4/15: EF 34% and findings - c/w NICM, normal RV size and function (RVEF 61%), Mild MR // b. Echo 2/15:  EF 30-35%, diff HK, ant-sept AK, Gr 2 DD, mild MR, trivial TR  //  c. Echo 5/17: EF 20-25%, severe diffuse HK, marked systolic dyssynchrony, grade 1 diastolic dysfunction, mild MR  //  d. RHC 5/17: Fick CO 2.9, RVSP 19, PASP 15, PW mean 2, low filing pressures and preserved CO   . Cognitive impairment 10/19/2017  MOCA was administered with a score of 23/30  . Diabetes mellitus   . Diabetic peripheral neuropathy ([place]) 12/09/2011  . Flu 10/17/2017  . Gastritis   . History of echocardiogram    Echo 6/18: EF 30-35, diffuse HK, grade 1 diastolic dysfunction, trivial MR, mild LAE, mild TR, no pericardial effusion  . History of nuclear stress test    Myoview 5/18: EF 49, no ischemia, inferoseptal defect c/w LBBB artifact (intermediate risk due to EF < 50).  [name] Kitchen HTN (hypertension)   . Hyperlipidemia   . Hyperlipidemia associated with type 2 diabetes mellitus ([place]) 03/05/2019  . Hypertension associated with diabetes ([place]) 09/28/2006   Qualifier: Diagnosis of  By: [name]    . Major depression 03/31/2013  . Melena 08/21/2020  . NICM (nonischemic cardiomyopathy) ([place])    a. Nuclear 5/13: Normal stress nuclear study. LV Ejection Fraction: 58%  //  b. LHC 10/14: Minor luminal irregularity in prox LAD, EF 35%   . Parkinson disease ([place])   . Plantar fasciitis   . Plantar fasciitis, bilateral 06/15/2012  . Rosacea 05/29/2009   Qualifier: Diagnosis of  By: [name] MD, Rodman Key    . Sensorineural hearing loss (SNHL), bilateral 01/04/2017  . Sleep apnea    was retested and no longer had it and so d/c CPAP  . Syncope   . Thoracic or lumbosacral neuritis or radiculitis, unspecified 07/03/2013  . Tinnitus, bilateral 01/04/2017  . Type II diabetes mellitus with  complication ([place]) 0/62/3762   Diabetic eye exam done by [facility]: Dr. Prudencio Burly on 11/08/13: no diabetic retinopathy. Repeat in 1 year    . Urticaria    BP 115/72   Pulse 80   Temp 99 F (37.2 C)   Ht 5' 1.5" (1.562 m)   Wt 134 lb 9.6 oz (61.1 kg)   LMP 08/01/2000 (Approximate)   SpO2 96%   BMI 25.02 kg/m   Opioid Risk Score:   Fall Risk Score:  `1  Depression screen PHQ 2/9  Depression screen [facility] 2/9 10/21/2020 10/07/2020 08/20/2020 07/01/2020 07/01/2020 03/31/2020 09/18/2019  Decreased Interest 0 1 0 0 0 0 0  Down, Depressed, Hopeless 0 0 0 0 0 0 0  PHQ - 2 Score 0 1 0 0 0 0 0  Altered sleeping 1 3 0 - - 1 -  Tired, decreased energy 1 2 0 - - 1 -  Change in appetite 0 1 0 - - 0 -  Feeling bad or failure about yourself  0 0 0 - - 0 -  Trouble concentrating 0 1 0 - - 1 -  Moving slowly or fidgety/restless 0 1 0 - - 0 -  Suicidal thoughts 0 0 0 - - 0 -  PHQ-9 Score 2 9 0 - - 3 -  Difficult doing work/chores Somewhat difficult Somewhat difficult - - - Not difficult at all -  Some recent data might be hidden   Review of Systems  Musculoskeletal: Positive for neck pain.  Neurological: Positive for weakness and numbness.       Tingling  All other systems reviewed and are negative.      Objective:   Physical Exam  Awake, alert, appropriate- NAD Less cervical dystonia- noted and less TTP      Assessment & Plan:    Pt is a 70 yr old female with hx of NICM, sCHF, DM- !c 7.2, Parkinsons' disease, CKD, and asthma and DJD/arthritis here for neck pain.  Of note, not on any blood thinners.  Here for f/u on  Cervical dystonia with myofascial pain.    1. Patient here for trigger point injections for  Consent done and on chart.  Cleaned areas with alcohol and injected using a 27 gauge 1.5 inch needle  Injected  2cc total Using 1% Lidocaine with no EPI  Upper traps Levators Posterior scalenes B/L Middle scalenes B/L Splenius Capitus Pectoralis  Major Rhomboids Infraspinatus Teres Major/minor Thoracic paraspinals Lumbar paraspinals Other injections-    Patient's level of pain prior was only with turning head to 1 side- had 2/10 Current level of pain after injections is- improved ROM- and pain "very little"  There was no bleeding or complications.  Patient was advised to drink a lot of water on day after injections to flush system Will have increased soreness for 12-48 hours after injections.  Can use Lidocaine patches the day AFTER injections Can use theracane on day of injections in places didn't inject Can use heating pad 4-6 hours AFTER injections  2. Don't hold fluid pill- drink an extra 8 oz today to compensate for getting injections- don't want to send into fluid overload.   3. F/U in 2 months or so for injections.   4. Will focus on sciatica and nerve pain at next appointment-

## 2020-11-26 LAB — CUP PACEART REMOTE DEVICE CHECK
Battery Remaining Longevity: 33 mo
Battery Voltage: 2.94 V
Brady Statistic AP VP Percent: 0 %
Brady Statistic AP VS Percent: 0.02 %
Brady Statistic AS VP Percent: 0.03 %
Brady Statistic AS VS Percent: 99.95 %
Brady Statistic RA Percent Paced: 0.02 %
Brady Statistic RV Percent Paced: 0.03 %
Date Time Interrogation Session: 20220428082608
HighPow Impedance: 79 Ohm
Implantable Lead Implant Date: 20150427
Implantable Lead Implant Date: 20150427
Implantable Lead Location: 753859
Implantable Lead Location: 753860
Implantable Lead Model: 5076
Implantable Lead Model: 6935
Implantable Pulse Generator Implant Date: 20150427
Lead Channel Impedance Value: 361 Ohm
Lead Channel Impedance Value: 4047 Ohm
Lead Channel Impedance Value: 4047 Ohm
Lead Channel Impedance Value: 4047 Ohm
Lead Channel Impedance Value: 589 Ohm
Lead Channel Impedance Value: 608 Ohm
Lead Channel Pacing Threshold Amplitude: 0.625 V
Lead Channel Pacing Threshold Amplitude: 0.625 V
Lead Channel Pacing Threshold Pulse Width: 0.4 ms
Lead Channel Pacing Threshold Pulse Width: 0.4 ms
Lead Channel Sensing Intrinsic Amplitude: 13.125 mV
Lead Channel Sensing Intrinsic Amplitude: 13.125 mV
Lead Channel Sensing Intrinsic Amplitude: 2.375 mV
Lead Channel Sensing Intrinsic Amplitude: 2.375 mV
Lead Channel Setting Pacing Amplitude: 2 V
Lead Channel Setting Pacing Amplitude: 2.5 V
Lead Channel Setting Pacing Pulse Width: 0.4 ms
Lead Channel Setting Sensing Sensitivity: 0.3 mV

## 2020-11-30 ENCOUNTER — Other Ambulatory Visit: Payer: Self-pay | Admitting: Family Medicine

## 2020-12-02 ENCOUNTER — Ambulatory Visit: Payer: Medicare Other | Admitting: Physician Assistant

## 2020-12-10 ENCOUNTER — Telehealth: Payer: Self-pay | Admitting: Physical Therapy

## 2020-12-10 NOTE — Telephone Encounter (Signed)
Veronica Shaw is scheduled for a return PT eval, which was recommended at her discharge.  Could you please write order for PT eval and treat in Epic?  Thank you.  Mady Haagensen, PT 12/10/20 11:28 AM Phone: [phone] Fax: [phone]

## 2020-12-10 NOTE — Telephone Encounter (Signed)
Unfortunately pt cx her last follow up and didn't reschedule so has no more appts with me.  PT will need to be ordered by whomever sees her (maybe PCP)?

## 2020-12-14 ENCOUNTER — Telehealth: Payer: Self-pay | Admitting: Physical Therapy

## 2020-12-14 ENCOUNTER — Other Ambulatory Visit: Payer: Self-pay | Admitting: Family Medicine

## 2020-12-14 DIAGNOSIS — G20 Parkinson's disease: Secondary | ICD-10-CM

## 2020-12-14 DIAGNOSIS — R296 Repeated falls: Secondary | ICD-10-CM

## 2020-12-14 NOTE — Telephone Encounter (Signed)
Thank you.  I will reach out to her PCP.  Mady Haagensen, PT 12/14/20 10:53 AM Phone: [phone] Fax: [phone]

## 2020-12-14 NOTE — Telephone Encounter (Signed)
Thank you.  You can write PT eval and treat.  Our referral coordinator says to make sure to refer to [facility] (department) and PT (specialty) and to include her appropriate diagnosis.  If you have any other questions, please contact our office at [phone].  Thank you so much.  Mady Haagensen, PT 12/14/20 12:31 PM Phone: [phone] Fax: [phone]

## 2020-12-14 NOTE — Telephone Encounter (Signed)
Hello, this patient is known to our clinic from previous bout of PT, ending 06/2020.   Veronica Shaw is scheduled for a return PT eval later this month, which was recommended at her discharge.  We initially requested the order of Dr. Carles Collet, but the patient has cancelled her last appointments with Dr. Carles Collet and has no further appointments with her.  Since you are her PCP,  could you please write order for PT eval and treat in Epic?  Thank you.  Mady Haagensen, PT 12/14/20 10:57 AM Phone: [phone] Fax: [phone]

## 2020-12-17 ENCOUNTER — Ambulatory Visit (INDEPENDENT_AMBULATORY_CARE_PROVIDER_SITE_OTHER): Payer: Medicare Other | Admitting: Adult Health

## 2020-12-17 ENCOUNTER — Other Ambulatory Visit: Payer: Self-pay

## 2020-12-17 ENCOUNTER — Encounter: Payer: Self-pay | Admitting: Adult Health

## 2020-12-17 DIAGNOSIS — I5022 Chronic systolic (congestive) heart failure: Secondary | ICD-10-CM | POA: Diagnosis not present

## 2020-12-17 DIAGNOSIS — J9611 Chronic respiratory failure with hypoxia: Secondary | ICD-10-CM | POA: Diagnosis not present

## 2020-12-17 DIAGNOSIS — J4540 Moderate persistent asthma, uncomplicated: Secondary | ICD-10-CM | POA: Diagnosis not present

## 2020-12-17 DIAGNOSIS — J310 Chronic rhinitis: Secondary | ICD-10-CM | POA: Diagnosis not present

## 2020-12-17 MED ORDER — FLONASE SENSIMIST 27.5 MCG/SPRAY NA SUSP: 2.0000 | Freq: Every day | NASAL | 12 refills | Status: DC

## 2020-12-17 NOTE — Assessment & Plan Note (Signed)
Continue on current regimen .   

## 2020-12-17 NOTE — Assessment & Plan Note (Signed)
Continue on O2 At bedtime   °

## 2020-12-17 NOTE — Patient Instructions (Addendum)
Continue on Oxygen 2l/m At bedtime.  Saline nasal spray Twice daily  As needed   Saline nasal Gel At bedtime  .  Flonase daily .  Continue Symbicort 2 puffs Twice daily  , rinse after use.  Use Albuterol  As needed for wheezing and shortness of breath  Activity as tolerated.  Follow up in  4-6  months with Dr. Vaughan Browner or Keydi Giel NP and As needed   Please contact office for sooner follow up if symptoms do not improve or worsen or seek emergency care

## 2020-12-17 NOTE — Assessment & Plan Note (Signed)
Appears euvolemic on exam ,continue on current regimen  Follow up with Cardiology .

## 2020-12-17 NOTE — Assessment & Plan Note (Signed)
Controlled on current regimen   Plan  Patient Instructions  Continue on Oxygen 2l/m At bedtime.  Saline nasal spray Twice daily  As needed   Saline nasal Gel At bedtime  .  Flonase daily .  Continue Symbicort 2 puffs Twice daily  , rinse after use.  Use Albuterol  As needed for wheezing and shortness of breath  Activity as tolerated.  Follow up in  4-6  months with Dr. Vaughan Browner or Nasiir Monts NP and As needed   Please contact office for sooner follow up if symptoms do not improve or worsen or seek emergency care

## 2020-12-17 NOTE — Progress Notes (Signed)
@Patient  ID: Veronica Shaw, female    DOB: [birth year], 70 y.o.   MRN: [id number]  Chief Complaint  Patient presents with  . Follow-up    Referring provider: [name], DO  HPI: 70 year old female never smoker followed for moderate persistent asthma and upper airway cough syndrome and chronic respiratory failure on home oxygen. Medical history significant for congestive heart failure, nonischemic cardiomyopathy status post pacemaker, osteoarthritis, Parkinson's disease, chronic kidney disease stage III, diabetes, GERD, depression  TEST/EVENTS :  Spirometry 01/05/15 FEV1 0.98 (46%) ratio 62  - - PFTs 02/17/15 FEV1 1.97 ( 93%) ratio 79 p 17% improvement from saba and dlco 107  - 08/19/2015 Walked RA x 3 laps @ 185 ft each stopped due to End of study, nl pace, no sob or desat  - NO 08/19/2015 = 79  - Spirometry 08/19/2015 FEV1 1.32 (65%) Ratio 71 p am dulera 200 x one puff   2021Sleep study showed no significant sleep apnea. Her AHI was 2.9/hour. She did have some mild desaturations with an SaO2 low at 86%.  2021 ONO + desats  2D echo May 2017 EF 20 to 50%, marked systolic dyssynchrony, grade 1 diastolic dysfunction  2D echo June 2018 EF 30 to 35%, diffuse hypokinesis, grade 1 diastolic dysfunction  2D echo March 2019 EF 60 to 53%, grade 2 diastolic dysfunction.  2D echo October 18, 2020 EF 40 to 45%, left ventricle global hypokinesis normal pulmonary artery pressure.  RV systolic function normal.  RV size is normal.  12/17/2020 Follow up ; Asthma and O2 RF  Patient returns for a 76-month follow-up.  Patient has underlying moderate persistent asthma.  She is maintained on Symbicort 2 puffs twice daily.  Patient says overall breathing is doing well.  She denies any flare of cough or wheezing.  Does get some shortness of breath with heavy activities.  No increased albuterol use.  Patient is fully vaccinated for COVID-19 including 2 boosters.  Patient was started on  oxygen March 2021.  Sleep study had showed no significant sleep apnea.  Overnight oximetry test showed some nocturnal desaturations.  She was started on 2 L of oxygen.  Patient says she is doing well on oxygen.  Feels that it does help her.  Patient is followed for congestive heart failure with cardiology.  Recently admitted in March for atrial tachycardia.  CT chest showed no PE.  Mild scarring atelectasis as the lower lobes.  She has some medication adjustments with improvement. 2D echo showed decreased EF at 40 to 45%, left ventricle global hypokinesis.  And normal pulmonary artery pressures. She is feeling better , denies increased edema or orthopnea.   Believes in super healing , went to healer in [place] . Feels she is some better. Is walking so much better. Does not need her walker or cane any more.   Allergies  Allergen Reactions  . Aspirin Swelling and Other (See Comments)    Other reaction(s): Other (See Comments) Causes nose bleeds *ONLY THE COATED ASA* Causes nose bleeds *ONLY THE COATED ASA*  . Penicillins Shortness Of Breath and Rash    Shortness of Breath - Throat felt like it was closing.   [name] [Conj Estrog-Medroxyprogest Ace] Shortness Of Breath    Throat swelling Throat swelling  . Ace Inhibitors Cough  . Simvastatin Other (See Comments) and Rash    Muscle aches    Immunization History  Administered Date(s) Administered  . Fluad Quad(high Dose 65+) 05/25/2020  . Influenza Split  06/17/2011, 04/18/2012  . Influenza Whole 05/17/2007, 06/14/2010  . Influenza,inj,Quad PF,6+ Mos 05/20/2014, 06/04/2015, 03/22/2016, 05/30/2017, 04/12/2018  . Influenza,inj,Quad PF,6-35 Mos 05/03/2013  . Influenza-Unspecified 04/01/2019  . PFIZER(Purple Top)SARS-COV-2 Vaccination 09/05/2019, 10/01/2019  . Pneumococcal Conjugate-13 05/20/2014  . Pneumococcal Polysaccharide-23 05/17/2007, 06/01/2013, 03/05/2019  . Td 03/01/2006  . Zoster 08/01/2009    Past Medical History:   Diagnosis Date  . Acute on chronic systolic CHF (congestive heart failure), NYHA class 4 ([place]) 10/19/2020  . Asthma   . Back pain 09/18/2019  . Cervical disc disorder with radiculopathy of cervical region 09/24/2018  . Chronic systolic CHF (congestive heart failure) ([place])    a. cMRI 4/15: EF 34% and findings - c/w NICM, normal RV size and function (RVEF 61%), Mild MR // b. Echo 2/15:  EF 30-35%, diff HK, ant-sept AK, Gr 2 DD, mild MR, trivial TR  //  c. Echo 5/17: EF 20-25%, severe diffuse HK, marked systolic dyssynchrony, grade 1 diastolic dysfunction, mild MR  //  d. RHC 5/17: Fick CO 2.9, RVSP 19, PASP 15, PW mean 2, low filing pressures and preserved CO   . Cognitive impairment 10/19/2017   MOCA was administered with a score of 23/30  . Diabetes mellitus   . Diabetic peripheral neuropathy ([place]) 12/09/2011  . Flu 10/17/2017  . Gastritis   . History of echocardiogram    Echo 6/18: EF 30-35, diffuse HK, grade 1 diastolic dysfunction, trivial MR, mild LAE, mild TR, no pericardial effusion  . History of nuclear stress test    Myoview 5/18: EF 49, no ischemia, inferoseptal defect c/w LBBB artifact (intermediate risk due to EF < 50).  [name] Kitchen HTN (hypertension)   . Hyperlipidemia   . Hyperlipidemia associated with type 2 diabetes mellitus ([place]) 03/05/2019  . Hypertension associated with diabetes ([place]) 09/28/2006   Qualifier: Diagnosis of  By: [name]    . Major depression 03/31/2013  . Melena 08/21/2020  . NICM (nonischemic cardiomyopathy) ([place])    a. Nuclear 5/13: Normal stress nuclear study. LV Ejection Fraction: 58%  //  b. LHC 10/14: Minor luminal irregularity in prox LAD, EF 35%   . Parkinson disease ([place])   . Plantar fasciitis   . Plantar fasciitis, bilateral 06/15/2012  . Rosacea 05/29/2009   Qualifier: Diagnosis of  By: [name] MD, Rodman Key    . Sensorineural hearing loss (SNHL), bilateral 01/04/2017  . Sleep apnea    was retested and no longer had it and so d/c CPAP  . Syncope   .  Thoracic or lumbosacral neuritis or radiculitis, unspecified 07/03/2013  . Tinnitus, bilateral 01/04/2017  . Type II diabetes mellitus with complication ([place]) [id number]   Diabetic eye exam done by [facility] Ophthalmology: Dr. Prudencio Burly on 11/08/13: no diabetic retinopathy. Repeat in 1 year    . Urticaria     Tobacco History: Social History   Tobacco Use  Smoking Status Never Smoker  Smokeless Tobacco Never Used  Tobacco Comment   exposed to smoke during child hood (parents)   Counseling given: Not Answered Comment: exposed to smoke during child hood (parents)   Outpatient Medications Prior to Visit  Medication Sig Dispense Refill  . APPLE CIDER VINEGAR PO Take 1 capsule by mouth daily.    . Biotin 5000 MCG CAPS Take 1 tablet by mouth daily.     . budesonide-formoterol (SYMBICORT) 160-4.5 MCG/ACT inhaler Inhale 2 puffs into the lungs 2 (two) times daily. 1 Inhaler 0  . carbidopa-levodopa (SINEMET IR) 25-100 MG tablet TAKE 1 TABLET BY  MOUTH THREE TIMES DAILY 270 tablet 0  . famotidine (PEPCID) 20 MG tablet TAKE 1 TABLET TWICE DAILY 180 tablet 2  . fluticasone (FLONASE SENSIMIST) 27.5 MCG/SPRAY nasal spray Place 2 sprays into the nose daily. 10 g 2  . furosemide (LASIX) 40 MG tablet Take 1 tablet (40 mg total) by mouth daily. 30 tablet 0  . levalbuterol (XOPENEX) 0.63 MG/3ML nebulizer solution USE 1 VIAL IN NEBULIZER EVERY 4 TO 6 HOURS AS NEEDED FOR WHEEZING AND FOR SHORTNESS OF BREATH 144 mL 11  . losartan (COZAAR) 25 MG tablet TAKE 1 TABLET EVERY DAY 90 tablet 3  . meclizine (ANTIVERT) 12.5 MG tablet Take 1 tablet (12.5 mg total) by mouth daily as needed for dizziness. Should not be long term. 10 tablet 0  . melatonin 5 MG TABS Take 5 mg by mouth at bedtime.    . metFORMIN (GLUCOPHAGE) 500 MG tablet Take 2 tablets (1,000 mg total) by mouth 2 (two) times daily with a meal. 180 tablet 3  . metoprolol succinate (TOPROL-XL) 50 MG 24 hr tablet TAKE 1 TABLET TWICE DAILY 180 tablet 2  . OXYGEN  Inhale into the lungs at bedtime. Inhale 2L into lungs    . potassium chloride (KLOR-CON) 10 MEQ tablet Take 10 mEq by mouth daily as needed (low potassium).    [name] Kitchen spironolactone (ALDACTONE) 25 MG tablet Take 0.5 tablets (12.5 mg total) by mouth daily. 15 tablet 0  . Ubiquinone (ULTRA COQ10 PO) Take 1 tablet by mouth 3 (three) times a week.     No facility-administered medications prior to visit.     Review of Systems:   Constitutional:   No  weight loss, night sweats,  Fevers, chills,  +fatigue, or  lassitude.  HEENT:   No headaches,  Difficulty swallowing,  Tooth/dental problems, or  Sore throat,                No sneezing, itching, ear ache,  +nasal congestion, post nasal drip,   CV:  No chest pain,  Orthopnea, PND, swelling in lower extremities, anasarca, dizziness, palpitations, syncope.   GI  No heartburn, indigestion, abdominal pain, nausea, vomiting, diarrhea, change in bowel habits, loss of appetite, bloody stools.   Resp:   No chest wall deformity  Skin: no rash or lesions.  GU: no dysuria, change in color of urine, no urgency or frequency.  No flank pain, no hematuria   MS:  No joint pain or swelling.  No decreased range of motion.  No back pain.    Physical Exam  BP (!) 122/58 (BP Location: Left Arm, Patient Position: Sitting, Cuff Size: Normal)   Pulse 89   Temp 97.9 F (36.6 C) (Skin)   Resp 18   Ht 5' 2.5" (1.588 m)   Wt 134 lb 12.8 oz (61.1 kg)   LMP 08/01/2000 (Approximate)   SpO2 98%   BMI 24.26 kg/m   GEN: A/Ox3; pleasant , NAD, well nourished    HEENT:  [place]/AT,    NOSE-clear, THROAT-clear, no lesions, no postnasal drip or exudate noted.   NECK:  Supple w/ fair ROM; no JVD; normal carotid impulses w/o bruits; no thyromegaly or nodules palpated; no lymphadenopathy.    RESP  Clear  P & A; w/o, wheezes/ rales/ or rhonchi. no accessory muscle use, no dullness to percussion  CARD:  RRR, no m/r/g, tr  peripheral edema, pulses intact, no cyanosis or  clubbing.  GI:   Soft & nt; nml bowel sounds; no organomegaly or  masses detected.   Musco: Warm bil, no deformities or joint swelling noted.   Neuro: alert, no focal deficits noted.    Skin: Warm, no lesions or rashes    Lab Results:  CBC  BMET   Imaging: CUP PACEART REMOTE DEVICE CHECK  Result Date: 11/26/2020 Scheduled remote reviewed. Normal device function.  R. Powers, CVRS Next remote 91 days.     PFT Results Latest Ref Rng & Units 02/17/2015  FVC-Pre L 2.23  FVC-Predicted Pre % 80  FVC-Post L 2.49  FVC-Predicted Post % 89  Pre FEV1/FVC % % 75  Post FEV1/FCV % % 79  FEV1-Pre L 1.67  FEV1-Predicted Pre % 79  FEV1-Post L 1.97  DLCO uncorrected ml/min/mmHg 21.77  DLCO UNC% % 107  DLVA Predicted % 117  TLC L 4.32  TLC % Predicted % 93  RV % Predicted % 70    Lab Results  Component Value Date   NITRICOXIDE 82 08/27/2018        Assessment & Plan:   Asthma Controlled on current regimen   Plan  Patient Instructions  Continue on Oxygen 2l/m At bedtime.  Saline nasal spray Twice daily  As needed   Saline nasal Gel At bedtime  .  Flonase daily .  Continue Symbicort 2 puffs Twice daily  , rinse after use.  Use Albuterol  As needed for wheezing and shortness of breath  Activity as tolerated.  Follow up in  4-6  months with Dr. Vaughan Browner or Fredi Geiler NP and As needed   Please contact office for sooner follow up if symptoms do not improve or worsen or seek emergency care        Chronic HFrEF (heart failure with reduced ejection fraction) ([place]) Appears euvolemic on exam ,continue on current regimen  Follow up with Cardiology .   Chronic rhinitis Continue on current regimen   Chronic respiratory failure with hypoxia (HCC) Continue on O2 At bedtime       Rexene Edison, NP 12/17/2020

## 2020-12-21 ENCOUNTER — Other Ambulatory Visit: Payer: Self-pay

## 2020-12-21 ENCOUNTER — Ambulatory Visit: Payer: Medicare Other | Attending: Neurology | Admitting: Physical Therapy

## 2020-12-21 DIAGNOSIS — R2689 Other abnormalities of gait and mobility: Secondary | ICD-10-CM | POA: Insufficient documentation

## 2020-12-21 NOTE — Therapy (Signed)
[facility] [address] [place], [place], [address] Phone: [phone]   Fax:  [phone]  Physical Therapy Evaluation Only  Patient Details  Name: Veronica Shaw MRN: [id number] Date of Birth: 1951-05-26 Referring Provider (PT): Alonza Bogus, DO   Encounter Date: 12/21/2020   PT End of Session - 12/21/20 1038    Visit Number 1    Number of Visits 1    Authorization Type Medicare    PT Start Time 8250    PT Stop Time 1030    PT Time Calculation (min) 15 min    Activity Tolerance Patient tolerated treatment well           Past Medical History:  Diagnosis Date  . Acute on chronic systolic CHF (congestive heart failure), NYHA class 4 ([place]) 10/19/2020  . Asthma   . Back pain 09/18/2019  . Cervical disc disorder with radiculopathy of cervical region 09/24/2018  . Chronic systolic CHF (congestive heart failure) ([place])    a. cMRI 4/15: EF 34% and findings - c/w NICM, normal RV size and function (RVEF 61%), Mild MR // b. Echo 2/15:  EF 30-35%, diff HK, ant-sept AK, Gr 2 DD, mild MR, trivial TR  //  c. Echo 5/17: EF 20-25%, severe diffuse HK, marked systolic dyssynchrony, grade 1 diastolic dysfunction, mild MR  //  d. RHC 5/17: Fick CO 2.9, RVSP 19, PASP 15, PW mean 2, low filing pressures and preserved CO   . Cognitive impairment 10/19/2017   MOCA was administered with a score of 23/30  . Diabetes mellitus   . Diabetic peripheral neuropathy ([place]) 12/09/2011  . Flu 10/17/2017  . Gastritis   . History of echocardiogram    Echo 6/18: EF 30-35, diffuse HK, grade 1 diastolic dysfunction, trivial MR, mild LAE, mild TR, no pericardial effusion  . History of nuclear stress test    Myoview 5/18: EF 49, no ischemia, inferoseptal defect c/w LBBB artifact (intermediate risk due to EF < 50).  [name] Kitchen HTN (hypertension)   . Hyperlipidemia   . Hyperlipidemia associated with type 2 diabetes mellitus ([place]) 03/05/2019  . Hypertension associated  with diabetes ([place]) 09/28/2006   Qualifier: Diagnosis of  By: [name]    . Major depression 03/31/2013  . Melena 08/21/2020  . NICM (nonischemic cardiomyopathy) ([place])    a. Nuclear 5/13: Normal stress nuclear study. LV Ejection Fraction: 58%  //  b. LHC 10/14: Minor luminal irregularity in prox LAD, EF 35%   . Parkinson disease ([place])   . Plantar fasciitis   . Plantar fasciitis, bilateral 06/15/2012  . Rosacea 05/29/2009   Qualifier: Diagnosis of  By: [name] MD, Rodman Key    . Sensorineural hearing loss (SNHL), bilateral 01/04/2017  . Sleep apnea    was retested and no longer had it and so d/c CPAP  . Syncope   . Thoracic or lumbosacral neuritis or radiculitis, unspecified 07/03/2013  . Tinnitus, bilateral 01/04/2017  . Type II diabetes mellitus with complication ([place]) 5/39/7673   Diabetic eye exam done by [facility] Ophthalmology: Dr. Prudencio Burly on 11/08/13: no diabetic retinopathy. Repeat in 1 year    . Urticaria     Past Surgical History:  Procedure Laterality Date  . BREAST EXCISIONAL BIOPSY Left 1970   benign cyst  . BREAST SURGERY Left   . BUNIONECTOMY    . CARDIAC CATHETERIZATION N/A 12/16/2015   Procedure: Right Heart Cath;  Surgeon: Sherren Mocha, MD;  Location: [facility] CV LAB;  Service: Cardiovascular;  Laterality: N/A;  . CHOLECYSTECTOMY    . eye lid surgery Bilateral 05/09/2019  . IMPLANTABLE CARDIOVERTER DEFIBRILLATOR IMPLANT  11-25-13   MDT dual chamber ICD implanted by [name] for primary prevention  . IMPLANTABLE CARDIOVERTER DEFIBRILLATOR IMPLANT N/A 11/25/2013   Procedure: IMPLANTABLE CARDIOVERTER DEFIBRILLATOR IMPLANT;  Surgeon: [name], MD;  Location[facility] CATH LAB;  Service: Cardiovascular;  Laterality: N/A;  . LEFT AND RIGHT HEART CATHETERIZATION WITH CORONARY ANGIOGRAM N/A 05/31/2013   Procedure: LEFT AND RIGHT HEART CATHETERIZATION WITH CORONARY ANGIOGRAM;  Surgeon: [name], MD;  Location: [facility] CATH LAB;  Service: Cardiovascular;  Laterality: N/A;   . TONSILLECTOMY    . TUBAL LIGATION      There were no vitals filed for this visit.       Pt reports that about a month ago went to a church service and received healing from them, hasn't used an AD device since and has had no falls. Pt has been dribbling a basketball with her grandson and going up/down stairs with no issues. Pt reports no longer having brain fog or dizziness anymore. Pt ambulated into eval today with no AD with no issues. Pt does not feel like she needs PT at this time and would not want to continue with eval, wanted to cancel it for today. Reports she is doing well and would not like to continue with any PT at this time. Discussed with pt will go ahead and schedule pt in 6 months for a return PT PD screen (as well as OT and ST as a baseline), with pt in agreement. Discussed if pt has worsening in balance/gait, then would need a new referral from her physician to return. Pt in agreement with plan.               Objective measurements completed on examination: See above findings.                    Patient will benefit from skilled therapeutic intervention in order to improve the following deficits and impairments:     Visit Diagnosis: Other abnormalities of gait and mobility     Problem List Patient Active Problem List   Diagnosis Date Noted  . Acute on chronic systolic CHF (congestive heart failure), NYHA class 4 ([place]) 10/19/2020  . Chronic HFrEF (heart failure with reduced ejection fraction) ([place])   . Cervical dystonia 10/07/2020  . Advanced care planning/counseling discussion 08/21/2020  . Dementia without behavioral disturbance ([place]) 08/21/2020  . Vision impairment, Right Eye 08/21/2020  . Chronic kidney disease, stage 3a ([place]) 08/18/2020  . Frequent urination 07/30/2020  . Chronic rhinitis 12/18/2019  . Chronic respiratory failure with hypoxia ([place]) 12/18/2019  . Hyperlipidemia associated with type 2 diabetes mellitus ([place])  03/05/2019  . Falls frequently   . Asthma 08/03/2017  . Osteopenia 07/10/2015  . Mild persistent asthma in adult without complication 25/36/6440  . NICM (nonischemic cardiomyopathy) ([place]) 04/23/2014  . Arthritis or polyarthritis, rheumatoid ([place]) 03/12/2014  . ICD (implantable cardioverter-defibrillator), dual, in situ 03/06/2014  . Heart Failure with Recovered Ejection Fraction, G2DD 05/31/2013  . Parkinson disease ([place]) 05/21/2013  . Vertigo 03/03/2013  . Gastroesophageal reflux disease without esophagitis 09/27/2012  . Dyspnea 09/19/2012  . Trigger point with neck pain 04/12/2012  . 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  . Diabetic peripheral neuropathy ([place]) 12/09/2011  . Chronic urticaria 05/27/2011  . Sinus tachycardia ([place]) 05/27/2011  . 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 05/20/2011  . Type II diabetes mellitus with  complication ([place]) 47/42/5956  . Hypertension associated with diabetes ([place]) 09/28/2006    Arliss Journey, PT, DPT  12/21/2020, 10:41 AM  [facility] [address] [place], [place], [address] Phone: [phone]   Fax:  [phone]  Name: Sally-Ann Cutbirth MRN: [id number] Date of Birth: 1950/09/07

## 2020-12-22 ENCOUNTER — Ambulatory Visit: Payer: Medicare Other

## 2020-12-22 NOTE — Chronic Care Management (AMB) (Signed)
Care Management    RN Visit Note  12/22/2020 Name: Veronica Shaw MRN: [id number] DOB: Dec 27, 1950  Subjective: Veronica Shaw is a 70 y.o. year old female who is a primary care patient of [name], DO. The care management team was consulted for assistance with disease management and care coordination needs.    Engaged with patient by telephone for follow up visit in response to provider referral for case management and/or care coordination services.   The patient was given information about Chronic Care Management services, agreed to services, and gave verbal consent prior to initiation of services.  Please see initial visit note for detailed documentation.  Patient agreed to services and consent obtained.    Assessment: The patient is doing well with no problems of chest pain shortness of breath or swelling.. See Care Plan below for interventions and patient self-care actives. Follow up Plan: Patient would like continued follow-up.  CCM RNCM will outreach the patient within the next 7 weeks.  Patient will call office if needed prior to next encounter : Review of patient past medical history, allergies, medications, health status, including review of consultants reports, laboratory and other test data, was performed as part of comprehensive evaluation and provision of chronic care management services.   SDOH (Social Determinants of Health) assessments and interventions performed:    Care Plan  Allergies  Allergen Reactions  . Aspirin Swelling and Other (See Comments)    Other reaction(s): Other (See Comments) Causes nose bleeds *ONLY THE COATED ASA* Causes nose bleeds *ONLY THE COATED ASA*  . Penicillins Shortness Of Breath and Rash    Shortness of Breath - Throat felt like it was closing.   [name] [Conj Estrog-Medroxyprogest Ace] Shortness Of Breath    Throat swelling Throat swelling  . Ace Inhibitors Cough  . Simvastatin Other (See Comments) and Rash     Muscle aches    Outpatient Encounter Medications as of 12/22/2020  Medication Sig  . APPLE CIDER VINEGAR PO Take 1 capsule by mouth daily.  . Biotin 5000 MCG CAPS Take 1 tablet by mouth daily.   . budesonide-formoterol (SYMBICORT) 160-4.5 MCG/ACT inhaler Inhale 2 puffs into the lungs 2 (two) times daily.  . carbidopa-levodopa (SINEMET IR) 25-100 MG tablet TAKE 1 TABLET BY MOUTH THREE TIMES DAILY  . famotidine (PEPCID) 20 MG tablet TAKE 1 TABLET TWICE DAILY  . fluticasone (FLONASE SENSIMIST) 27.5 MCG/SPRAY nasal spray Place 2 sprays into the nose daily.  . furosemide (LASIX) 40 MG tablet Take 1 tablet (40 mg total) by mouth daily.  [name] Kitchen levalbuterol (XOPENEX) 0.63 MG/3ML nebulizer solution USE 1 VIAL IN NEBULIZER EVERY 4 TO 6 HOURS AS NEEDED FOR WHEEZING AND FOR SHORTNESS OF BREATH  . losartan (COZAAR) 25 MG tablet TAKE 1 TABLET EVERY DAY  . meclizine (ANTIVERT) 12.5 MG tablet Take 1 tablet (12.5 mg total) by mouth daily as needed for dizziness. Should not be long term.  . melatonin 5 MG TABS Take 5 mg by mouth at bedtime.  . metFORMIN (GLUCOPHAGE) 500 MG tablet Take 2 tablets (1,000 mg total) by mouth 2 (two) times daily with a meal.  . metoprolol succinate (TOPROL-XL) 50 MG 24 hr tablet TAKE 1 TABLET TWICE DAILY  . OXYGEN Inhale into the lungs at bedtime. Inhale 2L into lungs  . potassium chloride (KLOR-CON) 10 MEQ tablet Take 10 mEq by mouth daily as needed (low potassium).  [name] Kitchen spironolactone (ALDACTONE) 25 MG tablet Take 0.5 tablets (12.5 mg total) by mouth  daily.  . Ubiquinone (ULTRA COQ10 PO) Take 1 tablet by mouth 3 (three) times a week.   No facility-administered encounter medications on file as of 12/22/2020.    Patient Active Problem List   Diagnosis Date Noted  . Acute on chronic systolic CHF (congestive heart failure), NYHA class 4 ([place]) 10/19/2020  . Chronic HFrEF (heart failure with reduced ejection fraction) ([place])   . Cervical dystonia 10/07/2020  . Advanced care  planning/counseling discussion 08/21/2020  . Dementia without behavioral disturbance ([name]) 08/21/2020  . Vision impairment, Right Eye 08/21/2020  . Chronic kidney disease, stage 3a ([place]) 08/18/2020  . Frequent urination 07/30/2020  . Chronic rhinitis 12/18/2019  . Chronic respiratory failure with hypoxia ([place]) 12/18/2019  . Hyperlipidemia associated with type 2 diabetes mellitus ([place]) 03/05/2019  . Falls frequently   . Asthma 08/03/2017  . Osteopenia 07/10/2015  . Mild persistent asthma in adult without complication 03/47/4259  . NICM (nonischemic cardiomyopathy) ([place]) 04/23/2014  . Arthritis or polyarthritis, rheumatoid ([place]) 03/12/2014  . ICD (implantable cardioverter-defibrillator), dual, in situ 03/06/2014  . Heart Failure with Recovered Ejection Fraction, G2DD 05/31/2013  . Parkinson disease ([place]) 05/21/2013  . Vertigo 03/03/2013  . Gastroesophageal reflux disease without esophagitis 09/27/2012  . Dyspnea 09/19/2012  . Trigger point with neck pain 04/12/2012  . 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  . Diabetic peripheral neuropathy ([place]) 12/09/2011  . Chronic urticaria 05/27/2011  . Sinus tachycardia ([place]) 05/27/2011  . 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 05/20/2011  . Type II diabetes mellitus with complication ([place]) 56/38/7564  . Hypertension associated with diabetes ([place]) 09/28/2006    Conditions to be addressed/monitored: CHF  Care Plan : RN Case Manager  Updates made by Lazaro Arms, RN since 12/22/2020 12:00 AM  Problem: Symptom Exacerbation (Heart Failure)   Long-Range Goal: Patient to manage and monitor signs and symptoms of Heart Failure   Start Date: 11/03/2020  Expected End Date: 01/28/2021  Priority: High  Note:   Current Barriers:  [name] Kitchen Knowledge deficit related to basic heart failure pathophysiology and self care management  Case Manager Clinical Goal(S):  . patient will verbalize understanding of Heart Failure Action Plan and when to call doctor  Interventions:  . Basic  overview and discussion of  Heart Failure reviewed . Provided verbal education on low sodium diet . Provided education on low sodium diet in My Chart . Reviewed Heart Failure Action Plan  . Assessed need for readable accurate scales in home- . Advised patient to weigh each morning after emptying bladder . Discussed importance of daily weight and advised patient to weigh and record daily-patient's weight today was 134 lbs. . Reviewed role of diuretics in prevention of fluid overload and management of heart failure-patient states that she is taking her medication and feels that the fluid pill is helping keep her weight down and reducing the swelling. . Patient states she is feeling good, no chest pain, swelling or shortness of breath. . The patient reports that her Pulmonology appointment went well and she has no issues with breathing and she is following her the advise.    Patient Goals/Self Care Activities:  . Takes Heart Failure Medications as prescribed . Weighs daily and record (notifying MD of 3 lb weight gain over night or 5 lb in a week) . Verbalizes understanding of and follows CHF Action Plan . Adheres to low sodium diet      Lazaro Arms RN, BSN, [place] Management Coordinator [place] Phone: [phone] I Fax: [phone]

## 2020-12-22 NOTE — Patient Instructions (Signed)
Visit Information  [name]  it was nice speaking with you. Please call me directly [phone] if you have questions about the goals we discussed.  Goals Addressed            This Visit's Progress   . Track and Manage Symptoms       Timeframe:  Long-Range Goal Priority:  High Start Date:11/03/20                            Expected End Date: 01/28/21                   - develop a rescue plan - follow rescue plan if symptoms flare-up - know when to call the doctor - track symptoms and what helps feel better or worse    Why is this important?   You will be able to handle your symptoms better if you keep track of them.  Making some simple changes to your lifestyle will help.  Eating healthy is one thing you can do to take good care of yourself.    Notes:        The patient verbalized understanding of instructions, educational materials, and care plan provided today and declined offer to receive copy of patient instructions, educational materials, and care plan.   Follow up Plan: Patient would like continued follow-up.  CCM RNCM will outreach the patient within the next 7 weeks.  Patient will call office if needed prior to next encounter  Lazaro Arms, RN  [phone]

## 2020-12-29 ENCOUNTER — Ambulatory Visit (INDEPENDENT_AMBULATORY_CARE_PROVIDER_SITE_OTHER): Payer: Medicare Other

## 2020-12-29 DIAGNOSIS — Z95810 Presence of automatic (implantable) cardiac defibrillator: Secondary | ICD-10-CM

## 2020-12-29 DIAGNOSIS — I5022 Chronic systolic (congestive) heart failure: Secondary | ICD-10-CM | POA: Diagnosis not present

## 2020-12-30 ENCOUNTER — Other Ambulatory Visit: Payer: Self-pay

## 2020-12-30 ENCOUNTER — Ambulatory Visit: Payer: Medicare Other | Admitting: Family Medicine

## 2020-12-30 ENCOUNTER — Ambulatory Visit
Admission: RE | Admit: 2020-12-30 | Discharge: 2020-12-30 | Disposition: A | Discharge status: Home / Self Care | Payer: Medicare Other | Source: Ambulatory Visit | Attending: Emergency Medicine | Admitting: Emergency Medicine

## 2020-12-30 VITALS — BP 115/63 | HR 95 | Temp 98.3°F | Resp 18

## 2020-12-30 DIAGNOSIS — N390 Urinary tract infection, site not specified: Secondary | ICD-10-CM | POA: Diagnosis not present

## 2020-12-30 LAB — POCT URINALYSIS DIP (MANUAL ENTRY)
Bilirubin, UA: NEGATIVE
Glucose, UA: NEGATIVE mg/dL
Ketones, POC UA: NEGATIVE mg/dL
Nitrite, UA: NEGATIVE
Spec Grav, UA: <=1.005 — AB (ref 1.010–1.025)
Urobilinogen, UA: 0.2 E.U./dL
pH, UA: 5.5 (ref 5.0–8.0)

## 2020-12-30 MED ORDER — SULFAMETHOXAZOLE-TRIMETHOPRIM 800-160 MG PO TABS: 1.0000 | ORAL_TABLET | Freq: Two times a day (BID) | ORAL | 0 refills | Status: AC

## 2020-12-30 NOTE — Progress Notes (Signed)
EPIC Encounter for ICM Monitoring  Patient Name: Veronica Shaw is a 70 y.o. female Date: 12/30/2020 Primary Care Physican: [name], DO Primary Cardiologist:Cooper/Weaver PA Electrophysiologist: Taylor 5/19/2022OfficeWeight: 134lbs  Spoke with patient and reports feeling well at this time.  Denies fluid symptoms.    OptiVolThoracic impedancenormal.  Prescribed:Furosemide40 mgTake 1 tablet (40 mg total) by mouth daily.   Labs: 11/04/2020 Creatinine 1.20, BUN 25, Potassium 5.0, Sodium 138, GFR 48 10/18/2020 Creatinine 1.04, BUN 19, Potassium 4.0, Sodium 136  10/17/2020 Creatinine 1.09, BUN 19, Potassium 4.4, Sodium 137  A complete set of results can be found in Results Review.  Recommendations: No changes and encouraged to call if experiencing any fluid symptoms.  Follow-up plan: ICM clinic phone appointment on7/06/2021. 91 day device clinic remote transmission7/28/2022.   EP/Cardiology Office Visits: 02/17/2021 with Richardson Dopp, PA.Recalls 4/2/2022with Dr. Burt Knack and 02/05/2021 for [name].   Copy of ICM check sent to Dr.Taylor   3 month ICM trend: 12/29/2020.    1 Year ICM trend:       [name], RN 12/30/2020 1:57 PM

## 2020-12-30 NOTE — Discharge Instructions (Signed)
Urine showed evidence of infection. We are treating you with bactrim- twice daily for 3 days. Be sure to take full course. Stay hydrated- urine should be pale yellow to clear.   Please return or follow up with your primary provider if symptoms not improving with treatment. Please return sooner if you have worsening of symptoms or develop fever, nausea, vomiting, abdominal pain, back pain, lightheadedness, dizziness.

## 2020-12-30 NOTE — ED Triage Notes (Signed)
Pt here for dysuria and pink tinged urine x 3 days

## 2020-12-30 NOTE — ED Provider Notes (Signed)
[facility]    CSN: [id number] Arrival date & time: 12/30/20  1236      History   Chief Complaint Chief Complaint  Patient presents with  . Appointment    1300  . Dysuria    HPI Veronica Shaw is a 70 y.o. female history of systolic CHF, CKD stage III, presenting today for evaluation of dysuria and hematuria.  Reports symptoms began 3 days ago.  Reports history of UTIs and symptoms feel similar.  Denies fever nausea or vomiting.  Reports slight right-sided lower back discomfort.  HPI  Past Medical History:  Diagnosis Date  . Acute on chronic systolic CHF (congestive heart failure), NYHA class 4 ([place]) 10/19/2020  . Asthma   . Back pain 09/18/2019  . Cervical disc disorder with radiculopathy of cervical region 09/24/2018  . Chronic systolic CHF (congestive heart failure) ([place])    a. cMRI 4/15: EF 34% and findings - c/w NICM, normal RV size and function (RVEF 61%), Mild MR // b. Echo 2/15:  EF 30-35%, diff HK, ant-sept AK, Gr 2 DD, mild MR, trivial TR  //  c. Echo 5/17: EF 20-25%, severe diffuse HK, marked systolic dyssynchrony, grade 1 diastolic dysfunction, mild MR  //  d. RHC 5/17: Fick CO 2.9, RVSP 19, PASP 15, PW mean 2, low filing pressures and preserved CO   . Cognitive impairment 10/19/2017   MOCA was administered with a score of 23/30  . Diabetes mellitus   . Diabetic peripheral neuropathy ([employer]) 12/09/2011  . Flu 10/17/2017  . Gastritis   . History of echocardiogram    Echo 6/18: EF 30-35, diffuse HK, grade 1 diastolic dysfunction, trivial MR, mild LAE, mild TR, no pericardial effusion  . History of nuclear stress test    Myoview 5/18: EF 49, no ischemia, inferoseptal defect c/w LBBB artifact (intermediate risk due to EF < 50).  [name] Kitchen HTN (hypertension)   . Hyperlipidemia   . Hyperlipidemia associated with type 2 diabetes mellitus ([place]) 03/05/2019  . Hypertension associated with diabetes ([place]) 09/28/2006   Qualifier: Diagnosis of  By: [name]    .  Major depression 03/31/2013  . Melena 08/21/2020  . NICM (nonischemic cardiomyopathy) ([place])    a. Nuclear 5/13: Normal stress nuclear study. LV Ejection Fraction: 58%  //  b. LHC 10/14: Minor luminal irregularity in prox LAD, EF 35%   . Parkinson disease ([place])   . Plantar fasciitis   . Plantar fasciitis, bilateral 06/15/2012  . Rosacea 05/29/2009   Qualifier: Diagnosis of  By: [name] MD, Rodman Key    . Sensorineural hearing loss (SNHL), bilateral 01/04/2017  . Sleep apnea    was retested and no longer had it and so d/c CPAP  . Syncope   . Thoracic or lumbosacral neuritis or radiculitis, unspecified 07/03/2013  . Tinnitus, bilateral 01/04/2017  . Type II diabetes mellitus with complication ([place]) 0/93/2671   Diabetic eye exam done by [facility]: Dr. Prudencio Burly on 11/08/13: no diabetic retinopathy. Repeat in 1 year    . Urticaria     Patient Active Problem List   Diagnosis Date Noted  . Acute on chronic systolic CHF (congestive heart failure), NYHA class 4 ([place]) 10/19/2020  . Chronic HFrEF (heart failure with reduced ejection fraction) ([place])   . Cervical dystonia 10/07/2020  . Advanced care planning/counseling discussion 08/21/2020  . Dementia without behavioral disturbance ([place]) 08/21/2020  . Vision impairment, Right Eye 08/21/2020  . Chronic kidney disease, stage 3a ([place]) 08/18/2020  . Frequent urination  07/30/2020  . Chronic rhinitis 12/18/2019  . Chronic respiratory failure with hypoxia ([place]) 12/18/2019  . Hyperlipidemia associated with type 2 diabetes mellitus ([place]) 03/05/2019  . Falls frequently   . Asthma 08/03/2017  . Osteopenia 07/10/2015  . Mild persistent asthma in adult without complication 83/38/2505  . NICM (nonischemic cardiomyopathy) ([place]) 04/23/2014  . Arthritis or polyarthritis, rheumatoid ([place]) 03/12/2014  . ICD (implantable cardioverter-defibrillator), dual, in situ 03/06/2014  . Heart Failure with Recovered Ejection Fraction, G2DD 05/31/2013  . Parkinson  disease ([place]) 05/21/2013  . Vertigo 03/03/2013  . Gastroesophageal reflux disease without esophagitis 09/27/2012  . Dyspnea 09/19/2012  . Trigger point with neck pain 04/12/2012  . 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  . Diabetic peripheral neuropathy ([place]) 12/09/2011  . Chronic urticaria 05/27/2011  . Sinus tachycardia ([place]) 05/27/2011  . 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 05/20/2011  . Type II diabetes mellitus with complication ([place]) 39/76/7341  . Hypertension associated with diabetes ([place]) 09/28/2006    Past Surgical History:  Procedure Laterality Date  . BREAST EXCISIONAL BIOPSY Left 1970   benign cyst  . BREAST SURGERY Left   . BUNIONECTOMY    . CARDIAC CATHETERIZATION N/A 12/16/2015   Procedure: Right Heart Cath;  Surgeon: Sherren Mocha, MD;  Location: [facility];  Service: Cardiovascular;  Laterality: N/A;  . CHOLECYSTECTOMY    . eye lid surgery Bilateral 05/09/2019  . IMPLANTABLE CARDIOVERTER DEFIBRILLATOR IMPLANT  11-25-13   MDT dual chamber ICD implanted by [name] for primary prevention  . IMPLANTABLE CARDIOVERTER DEFIBRILLATOR IMPLANT N/A 11/25/2013   Procedure: IMPLANTABLE CARDIOVERTER DEFIBRILLATOR IMPLANT;  Surgeon: [name], MD;  Location: [facility] CATH LAB;  Service: Cardiovascular;  Laterality: N/A;  . LEFT AND RIGHT HEART CATHETERIZATION WITH CORONARY ANGIOGRAM N/A 05/31/2013   Procedure: LEFT AND RIGHT HEART CATHETERIZATION WITH CORONARY ANGIOGRAM;  Surgeon: [name], MD;  Location: [facility] CATH LAB;  Service: Cardiovascular;  Laterality: N/A;  . TONSILLECTOMY    . TUBAL LIGATION      OB History   No obstetric history on file.      Home Medications    Prior to Admission medications   Medication Sig Start Date End Date Taking? Authorizing Provider  sulfamethoxazole-trimethoprim (BACTRIM DS) 800-160 MG tablet Take 1 tablet by mouth 2 (two) times daily for 3 days. 12/30/20 01/02/21 Yes Cameryn Schum C, PA-C  APPLE CIDER VINEGAR PO Take 1 capsule by mouth  daily.    [provider]  Biotin 5000 MCG CAPS Take 1 tablet by mouth daily.     [provider]  budesonide-formoterol (SYMBICORT) 160-4.5 MCG/ACT inhaler Inhale 2 puffs into the lungs 2 (two) times daily. 10/24/19   Lauraine Rinne, NP  carbidopa-levodopa (SINEMET IR) 25-100 MG tablet TAKE 1 TABLET BY MOUTH THREE TIMES DAILY 09/28/20   Tat, Eustace Quail, DO  famotidine (PEPCID) 20 MG tablet TAKE 1 TABLET TWICE DAILY 06/17/19   Caroline More, DO  fluticasone (FLONASE SENSIMIST) 27.5 MCG/SPRAY nasal spray Place 2 sprays into the nose daily. 12/17/20   Parrett, Fonnie Mu, NP  furosemide (LASIX) 40 MG tablet Take 1 tablet (40 mg total) by mouth daily. 10/20/20   Shary Key, DO  levalbuterol (XOPENEX) 0.63 MG/3ML nebulizer solution USE 1 VIAL IN NEBULIZER EVERY 4 TO 6 HOURS AS NEEDED FOR WHEEZING AND FOR SHORTNESS OF BREATH 10/14/20   Marshell Garfinkel, MD  losartan (COZAAR) 25 MG tablet TAKE 1 TABLET EVERY DAY 04/01/20   Sherren Mocha, MD  meclizine (ANTIVERT) 12.5 MG tablet  Take 1 tablet (12.5 mg total) by mouth daily as needed for dizziness. Should not be long term. 10/02/20   [name], DO  melatonin 5 MG TABS Take 5 mg by mouth at bedtime.    [provider]  metFORMIN (GLUCOPHAGE) 500 MG tablet Take 2 tablets (1,000 mg total) by mouth 2 (two) times daily with a meal. 06/24/20   [name], DO  metoprolol succinate (TOPROL-XL) 50 MG 24 hr tablet TAKE 1 TABLET TWICE DAILY 06/05/20   [name], MD  OXYGEN Inhale into the lungs at bedtime. Inhale 2L into lungs    [provider]  potassium chloride (KLOR-CON) 10 MEQ tablet Take 10 mEq by mouth daily as needed (low potassium).    [provider]  spironolactone (ALDACTONE) 25 MG tablet Take 0.5 tablets (12.5 mg total) by mouth daily. 10/20/20   Shary Key, DO  Ubiquinone (ULTRA COQ10 PO) Take 1 tablet by mouth 3 (three) times a week.    [provider]    Family  History Family History  Problem Relation Age of Onset  . Coronary artery disease Father        Died age 2  . Heart attack Father   . Diabetes Father   . Coronary artery disease Mother        Died age 57  . Heart attack Mother   . Diabetes Mother   . Parkinson's disease Sister   . Heart disease Sister   . Hepatitis C Sister   . Diabetes Sister   . Diabetes Son 37       T1DM  . Healthy Daughter   . Diabetes Sister   . Heart disease Sister   . Diabetes Sister   . Heart disease Sister   . Breast cancer Neg Hx     Social History Social History   Tobacco Use  . Smoking status: Never Smoker  . Smokeless tobacco: Never Used  . Tobacco comment: exposed to smoke during child hood (parents)  Vaping Use  . Vaping Use: Never used  Substance Use Topics  . Alcohol use: No    Alcohol/week: 0.0 standard drinks  . Drug use: No     Allergies   Aspirin, Penicillins, Prempro [conj estrog-medroxyprogest ace], Ace inhibitors, and Simvastatin   Review of Systems Review of Systems  Constitutional: Negative for fever.  Respiratory: Negative for shortness of breath.   Cardiovascular: Negative for chest pain.  Gastrointestinal: Negative for abdominal pain, diarrhea, nausea and vomiting.  Genitourinary: Positive for dysuria, frequency and hematuria. Negative for flank pain, genital sores, menstrual problem, vaginal bleeding, vaginal discharge and vaginal pain.  Musculoskeletal: Negative for back pain.  Skin: Negative for rash.  Neurological: Negative for dizziness, light-headedness and headaches.     Physical Exam Triage Vital Signs ED Triage Vitals [12/30/20 1311]  Enc Vitals Group     BP 115/63     Pulse Rate 95     Resp 18     Temp 98.3 F (36.8 C)     Temp Source Oral     SpO2 95 %     Weight      Height      Head Circumference      Peak Flow      Pain Score 0     Pain Loc      Pain Edu?      Excl. in [place]?    No data found.  Updated Vital Signs BP 115/63 (BP  Location: Left Arm)   Pulse 95   Temp 98.3 F (36.8 C) (Oral)   Resp 18   LMP 08/01/2000 (Approximate)   SpO2 95%   Visual Acuity Right Eye Distance:   Left Eye Distance:   Bilateral Distance:    Right Eye Near:   Left Eye Near:    Bilateral Near:     Physical Exam Vitals and nursing note reviewed.  Constitutional:      Appearance: She is well-developed.     Comments: No acute distress  HENT:     Head: Normocephalic and atraumatic.     Nose: Nose normal.  Eyes:     Conjunctiva/sclera: Conjunctivae normal.  Cardiovascular:     Rate and Rhythm: Normal rate.  Pulmonary:     Effort: Pulmonary effort is normal. No respiratory distress.  Abdominal:     General: There is no distension.  Musculoskeletal:        General: Normal range of motion.     Cervical back: Neck supple.  Skin:    General: Skin is warm and dry.  Neurological:     Mental Status: She is alert and oriented to person, place, and time.      UC Treatments / Results  Labs (all labs ordered are listed, but only abnormal results are displayed) Labs Reviewed  POCT URINALYSIS DIP (MANUAL ENTRY) - Abnormal; Notable for the following components:      Result Value   Spec Grav, UA <=1.005 (*)    Blood, UA moderate (*)    Protein Ur, POC trace (*)    Leukocytes, UA Small (1+) (*)    All other components within normal limits  URINE CULTURE    EKG   Radiology No results found.  Procedures Procedures (including critical care time)  Medications Ordered in UC Medications - No data to display  Initial Impression / Assessment and Plan / UC Course  I have reviewed the triage vital signs and the nursing notes.  Pertinent labs & imaging results that were available during my care of the patient were reviewed by me and considered in my medical decision making (see chart for details).     Small leuks, moderate hemoglobin, consistent with UTI, treating with Bactrim twice daily x3 days, push fluids, has  allergy to penicillins, tolerated Bactrim previously.  Last creatinine 1.2.  Discussed strict return precautions. Patient verbalized understanding and is agreeable with plan.  Final Clinical Impressions(s) / UC Diagnoses   Final diagnoses:  Lower urinary tract infection, acute     Discharge Instructions     Urine showed evidence of infection. We are treating you with bactrim- twice daily for 3 days. Be sure to take full course. Stay hydrated- urine should be pale yellow to clear.   Please return or follow up with your primary provider if symptoms not improving with treatment. Please return sooner if you have worsening of symptoms or develop fever, nausea, vomiting, abdominal pain, back pain, lightheadedness, dizziness.    ED Prescriptions    Medication Sig Dispense Auth. Provider   sulfamethoxazole-trimethoprim (BACTRIM DS) 800-160 MG tablet Take 1 tablet by mouth 2 (two) times daily for 3 days. 6 tablet Kayton Dunaj, [place] C, PA-C     PDMP not reviewed this encounter.   Janith Lima, [place] 12/30/20 1349

## 2020-12-31 LAB — URINE CULTURE: Culture: NO GROWTH

## 2021-01-01 ENCOUNTER — Ambulatory Visit (INDEPENDENT_AMBULATORY_CARE_PROVIDER_SITE_OTHER)
Admission: EM | Admit: 2021-01-01 | Discharge: 2021-01-01 | Disposition: A | Discharge status: Home / Self Care | Payer: Medicare Other | Source: Home / Self Care

## 2021-01-01 ENCOUNTER — Encounter (HOSPITAL_COMMUNITY): Payer: Self-pay | Admitting: Pharmacy Technician

## 2021-01-01 ENCOUNTER — Encounter (HOSPITAL_COMMUNITY): Payer: Self-pay | Admitting: Emergency Medicine

## 2021-01-01 ENCOUNTER — Emergency Department (HOSPITAL_COMMUNITY)
Admission: EM | Admit: 2021-01-01 | Discharge: 2021-01-02 | Disposition: A | Discharge status: Home / Self Care | Payer: Medicare Other | Attending: Emergency Medicine | Admitting: Emergency Medicine

## 2021-01-01 ENCOUNTER — Emergency Department (HOSPITAL_COMMUNITY): Payer: Medicare Other

## 2021-01-01 ENCOUNTER — Other Ambulatory Visit: Payer: Self-pay

## 2021-01-01 DIAGNOSIS — Z79899 Other long term (current) drug therapy: Secondary | ICD-10-CM | POA: Insufficient documentation

## 2021-01-01 DIAGNOSIS — J4530 Mild persistent asthma, uncomplicated: Secondary | ICD-10-CM | POA: Diagnosis not present

## 2021-01-01 DIAGNOSIS — E785 Hyperlipidemia, unspecified: Secondary | ICD-10-CM | POA: Insufficient documentation

## 2021-01-01 DIAGNOSIS — N12 Tubulo-interstitial nephritis, not specified as acute or chronic: Secondary | ICD-10-CM | POA: Insufficient documentation

## 2021-01-01 DIAGNOSIS — I5023 Acute on chronic systolic (congestive) heart failure: Secondary | ICD-10-CM | POA: Insufficient documentation

## 2021-01-01 DIAGNOSIS — E1122 Type 2 diabetes mellitus with diabetic chronic kidney disease: Secondary | ICD-10-CM | POA: Diagnosis not present

## 2021-01-01 DIAGNOSIS — Z7984 Long term (current) use of oral hypoglycemic drugs: Secondary | ICD-10-CM | POA: Insufficient documentation

## 2021-01-01 DIAGNOSIS — N1831 Chronic kidney disease, stage 3a: Secondary | ICD-10-CM | POA: Diagnosis not present

## 2021-01-01 DIAGNOSIS — K7689 Other specified diseases of liver: Secondary | ICD-10-CM | POA: Diagnosis not present

## 2021-01-01 DIAGNOSIS — E1169 Type 2 diabetes mellitus with other specified complication: Secondary | ICD-10-CM | POA: Diagnosis not present

## 2021-01-01 DIAGNOSIS — G20 Parkinson's disease: Secondary | ICD-10-CM | POA: Insufficient documentation

## 2021-01-01 DIAGNOSIS — I130 Hypertensive heart and chronic kidney disease with heart failure and stage 1 through stage 4 chronic kidney disease, or unspecified chronic kidney disease: Secondary | ICD-10-CM | POA: Diagnosis not present

## 2021-01-01 DIAGNOSIS — E1142 Type 2 diabetes mellitus with diabetic polyneuropathy: Secondary | ICD-10-CM | POA: Diagnosis not present

## 2021-01-01 DIAGNOSIS — Z7951 Long term (current) use of inhaled steroids: Secondary | ICD-10-CM | POA: Insufficient documentation

## 2021-01-01 DIAGNOSIS — N200 Calculus of kidney: Secondary | ICD-10-CM | POA: Insufficient documentation

## 2021-01-01 DIAGNOSIS — I700 Atherosclerosis of aorta: Secondary | ICD-10-CM | POA: Diagnosis not present

## 2021-01-01 DIAGNOSIS — K5750 Diverticulosis of both small and large intestine without perforation or abscess without bleeding: Secondary | ICD-10-CM | POA: Diagnosis not present

## 2021-01-01 DIAGNOSIS — N261 Atrophy of kidney (terminal): Secondary | ICD-10-CM | POA: Diagnosis not present

## 2021-01-01 DIAGNOSIS — R109 Unspecified abdominal pain: Secondary | ICD-10-CM | POA: Diagnosis present

## 2021-01-01 LAB — CBC WITH DIFFERENTIAL/PLATELET
Abs Immature Granulocytes: 0.02 10*3/uL (ref 0.00–0.07)
Basophils Absolute: 0 10*3/uL (ref 0.0–0.1)
Basophils Relative: 0 %
Eosinophils Absolute: 0.2 10*3/uL (ref 0.0–0.5)
Eosinophils Relative: 3 %
HCT: 38.8 % (ref 36.0–46.0)
Hemoglobin: 12.5 g/dL (ref 12.0–15.0)
Immature Granulocytes: 0 %
Lymphocytes Relative: 23 %
Lymphs Abs: 1.6 10*3/uL (ref 0.7–4.0)
MCH: 30.3 pg (ref 26.0–34.0)
MCHC: 32.2 g/dL (ref 30.0–36.0)
MCV: 93.9 fL (ref 80.0–100.0)
Monocytes Absolute: 0.7 10*3/uL (ref 0.1–1.0)
Monocytes Relative: 10 %
Neutro Abs: 4.3 10*3/uL (ref 1.7–7.7)
Neutrophils Relative %: 64 %
Platelets: 213 10*3/uL (ref 150–400)
RBC: 4.13 MIL/uL (ref 3.87–5.11)
RDW: 11.8 % (ref 11.5–15.5)
WBC: 6.9 10*3/uL (ref 4.0–10.5)
nRBC: 0 % (ref 0.0–0.2)

## 2021-01-01 LAB — POCT URINALYSIS DIPSTICK, ED / UC
Bilirubin Urine: NEGATIVE
Glucose, UA: NEGATIVE mg/dL
Ketones, ur: NEGATIVE mg/dL
Nitrite: NEGATIVE
Protein, ur: NEGATIVE mg/dL
Specific Gravity, Urine: <=1.005 (ref 1.005–1.030)
Urobilinogen, UA: 0.2 mg/dL (ref 0.0–1.0)
pH: 6 (ref 5.0–8.0)

## 2021-01-01 LAB — URINALYSIS, ROUTINE W REFLEX MICROSCOPIC
Bacteria, UA: NONE SEEN
Bilirubin Urine: NEGATIVE
Glucose, UA: NEGATIVE mg/dL
Hgb urine dipstick: NEGATIVE
Ketones, ur: NEGATIVE mg/dL
Nitrite: NEGATIVE
Protein, ur: NEGATIVE mg/dL
Specific Gravity, Urine: 1.005 (ref 1.005–1.030)
pH: 6 (ref 5.0–8.0)

## 2021-01-01 LAB — BASIC METABOLIC PANEL
Anion gap: 10 (ref 5–15)
BUN: 24 mg/dL — ABNORMAL HIGH (ref 8–23)
CO2: 24 mmol/L (ref 22–32)
Calcium: 9.6 mg/dL (ref 8.9–10.3)
Chloride: 99 mmol/L (ref 98–111)
Creatinine, Ser: 1.72 mg/dL — ABNORMAL HIGH (ref 0.44–1.00)
GFR, Estimated: 31 mL/min — ABNORMAL LOW (ref >60)
Glucose, Bld: 255 mg/dL — ABNORMAL HIGH (ref 70–99)
Potassium: 4.1 mmol/L (ref 3.5–5.1)
Sodium: 133 mmol/L — ABNORMAL LOW (ref 135–145)

## 2021-01-01 MED ORDER — CEPHALEXIN 250 MG PO CAPS: 250.0000 mg | ORAL_CAPSULE | Freq: Three times a day (TID) | ORAL | 0 refills | Status: AC

## 2021-01-01 MED ORDER — SODIUM CHLORIDE 0.9 % IV BOLUS: 500.0000 mL | Freq: Once | INTRAVENOUS | Status: DC

## 2021-01-01 MED ORDER — SODIUM CHLORIDE 0.9 % IV BOLUS
500.0000 mL | Freq: Once | INTRAVENOUS | Status: AC
  Administered 2021-01-01: 500 mL via INTRAVENOUS

## 2021-01-01 MED ORDER — SODIUM CHLORIDE 0.9 % IV BOLUS: 1000.0000 mL | Freq: Once | INTRAVENOUS | Status: DC

## 2021-01-01 MED ORDER — SODIUM CHLORIDE 0.9 % IV SOLN
1.0000 g | Freq: Once | INTRAVENOUS | Status: AC
  Administered 2021-01-01: 1 g via INTRAVENOUS
  Filled 2021-01-01: qty 10

## 2021-01-01 NOTE — ED Provider Notes (Signed)
[facility]    CSN: [id number] Arrival date & time: 01/01/21  1019      History   Chief Complaint Chief Complaint  Patient presents with  . UTI  . Urinary Frequency  . Shortness of Breath    HPI Veronica Shaw is a 70 y.o. female.   Patient presents with dysuria, hematuria, right flank pain, suprapubic pain, urinary frequency, shortness of breath that began 3 days ago. Evaluated and treated for UTI on 12/30/20 but was told to stop antibiotic due to urine culture not showing bacteria. Endorses symptoms have worsened and she felt horrible overnight. History of CHF, CKD, defibrillator, Asthma, DM, HTN, HLD, oxygen dependent prn. Has not taken medications this morning because she didn't want to have to urinate more, says she will take when she returns home. Denies fever, chills, body aches.     Past Medical History:  Diagnosis Date  . Acute on chronic systolic CHF (congestive heart failure), NYHA class 4 ([place]) 10/19/2020  . Asthma   . Back pain 09/18/2019  . Cervical disc disorder with radiculopathy of cervical region 09/24/2018  . Chronic systolic CHF (congestive heart failure) ([place])    a. cMRI 4/15: EF 34% and findings - c/w NICM, normal RV size and function (RVEF 61%), Mild MR // b. Echo 2/15:  EF 30-35%, diff HK, ant-sept AK, Gr 2 DD, mild MR, trivial TR  //  c. Echo 5/17: EF 20-25%, severe diffuse HK, marked systolic dyssynchrony, grade 1 diastolic dysfunction, mild MR  //  d. RHC 5/17: Fick CO 2.9, RVSP 19, PASP 15, PW mean 2, low filing pressures and preserved CO   . Cognitive impairment 10/19/2017   MOCA was administered with a score of 23/30  . Diabetes mellitus   . Diabetic peripheral neuropathy ([place]) 12/09/2011  . Flu 10/17/2017  . Gastritis   . History of echocardiogram    Echo 6/18: EF 30-35, diffuse HK, grade 1 diastolic dysfunction, trivial MR, mild LAE, mild TR, no pericardial effusion  . History of nuclear stress test    Myoview 5/18: EF 49, no ischemia,  inferoseptal defect c/w LBBB artifact (intermediate risk due to EF < 50).  [name] Kitchen HTN (hypertension)   . Hyperlipidemia   . Hyperlipidemia associated with type 2 diabetes mellitus ([place]) 03/05/2019  . Hypertension associated with diabetes ([place]) 09/28/2006   Qualifier: Diagnosis of  By: [name]    . Major depression 03/31/2013  . Melena 08/21/2020  . NICM (nonischemic cardiomyopathy) ([place])    a. Nuclear 5/13: Normal stress nuclear study. LV Ejection Fraction: 58%  //  b. LHC 10/14: Minor luminal irregularity in prox LAD, EF 35%   . Parkinson disease ([place])   . Plantar fasciitis   . Plantar fasciitis, bilateral 06/15/2012  . Rosacea 05/29/2009   Qualifier: Diagnosis of  By: [name] MD, Rodman Key    . Sensorineural hearing loss (SNHL), bilateral 01/04/2017  . Sleep apnea    was retested and no longer had it and so d/c CPAP  . Syncope   . Thoracic or lumbosacral neuritis or radiculitis, unspecified 07/03/2013  . Tinnitus, bilateral 01/04/2017  . Type II diabetes mellitus with complication ([place]) 0/60/0459   Diabetic eye exam done by [facility]: Dr. Prudencio Burly on 11/08/13: no diabetic retinopathy. Repeat in 1 year    . Urticaria     Patient Active Problem List   Diagnosis Date Noted  . Acute on chronic systolic CHF (congestive heart failure), NYHA class 4 ([place]) 10/19/2020  .  Chronic HFrEF (heart failure with reduced ejection fraction) ([place])   . Cervical dystonia 10/07/2020  . Advanced care planning/counseling discussion 08/21/2020  . Dementia without behavioral disturbance ([place]) 08/21/2020  . Vision impairment, Right Eye 08/21/2020  . Chronic kidney disease, stage 3a ([place]) 08/18/2020  . Frequent urination 07/30/2020  . Chronic rhinitis 12/18/2019  . Chronic respiratory failure with hypoxia ([place]) 12/18/2019  . Hyperlipidemia associated with type 2 diabetes mellitus ([place]) 03/05/2019  . Falls frequently   . Asthma 08/03/2017  . Osteopenia 07/10/2015  . Mild persistent asthma in adult  without complication 52/77/8242  . NICM (nonischemic cardiomyopathy) ([place]) 04/23/2014  . Arthritis or polyarthritis, rheumatoid ([place]) 03/12/2014  . ICD (implantable cardioverter-defibrillator), dual, in situ 03/06/2014  . Heart Failure with Recovered Ejection Fraction, G2DD 05/31/2013  . Parkinson disease ([place]) 05/21/2013  . Vertigo 03/03/2013  . Gastroesophageal reflux disease without esophagitis 09/27/2012  . Dyspnea 09/19/2012  . Trigger point with neck pain 04/12/2012  . 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  . Diabetic peripheral neuropathy ([place]) 12/09/2011  . Chronic urticaria 05/27/2011  . Sinus tachycardia ([place]) 05/27/2011  . 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 05/20/2011  . Type II diabetes mellitus with complication ([place]) 35/36/1443  . Hypertension associated with diabetes ([place]) 09/28/2006    Past Surgical History:  Procedure Laterality Date  . BREAST EXCISIONAL BIOPSY Left 1970   benign cyst  . BREAST SURGERY Left   . BUNIONECTOMY    . CARDIAC CATHETERIZATION N/A 12/16/2015   Procedure: Right Heart Cath;  Surgeon: Sherren Mocha, MD;  Location: [place] [facility];  Service: Cardiovascular;  Laterality: N/A;  . CHOLECYSTECTOMY    . eye lid surgery Bilateral 05/09/2019  . IMPLANTABLE CARDIOVERTER DEFIBRILLATOR IMPLANT  11-25-13   MDT dual chamber ICD implanted by [name] for primary prevention  . IMPLANTABLE CARDIOVERTER DEFIBRILLATOR IMPLANT N/A 11/25/2013   Procedure: IMPLANTABLE CARDIOVERTER DEFIBRILLATOR IMPLANT;  Surgeon: [name], MD;  Location: [facility] CATH LAB;  Service: Cardiovascular;  Laterality: N/A;  . LEFT AND RIGHT HEART CATHETERIZATION WITH CORONARY ANGIOGRAM N/A 05/31/2013   Procedure: LEFT AND RIGHT HEART CATHETERIZATION WITH CORONARY ANGIOGRAM;  Surgeon: [name], MD;  Location: [facility] CATH LAB;  Service: Cardiovascular;  Laterality: N/A;  . TONSILLECTOMY    . TUBAL LIGATION      OB History   No obstetric history on file.      Home Medications    Prior  to Admission medications   Medication Sig Start Date End Date Taking? Authorizing Provider  APPLE CIDER VINEGAR PO Take 1 capsule by mouth daily.    [provider]  Biotin 5000 MCG CAPS Take 1 tablet by mouth daily.     [provider]  budesonide-formoterol (SYMBICORT) 160-4.5 MCG/ACT inhaler Inhale 2 puffs into the lungs 2 (two) times daily. 10/24/19   Lauraine Rinne, NP  carbidopa-levodopa (SINEMET IR) 25-100 MG tablet TAKE 1 TABLET BY MOUTH THREE TIMES DAILY 09/28/20   Tat, Eustace Quail, DO  famotidine (PEPCID) 20 MG tablet TAKE 1 TABLET TWICE DAILY 06/17/19   Caroline More, DO  fluticasone (FLONASE SENSIMIST) 27.5 MCG/SPRAY nasal spray Place 2 sprays into the nose daily. 12/17/20   Parrett, Fonnie Mu, NP  furosemide (LASIX) 40 MG tablet Take 1 tablet (40 mg total) by mouth daily. 10/20/20   Shary Key, DO  levalbuterol (XOPENEX) 0.63 MG/3ML nebulizer solution USE 1 VIAL IN NEBULIZER EVERY 4 TO 6 HOURS AS NEEDED FOR WHEEZING AND FOR SHORTNESS OF BREATH 10/14/20   Mannam, Praveen,  MD  losartan (COZAAR) 25 MG tablet TAKE 1 TABLET EVERY DAY 04/01/20   Sherren Mocha, MD  meclizine (ANTIVERT) 12.5 MG tablet Take 1 tablet (12.5 mg total) by mouth daily as needed for dizziness. Should not be long term. 10/02/20   [name], DO  melatonin 5 MG TABS Take 5 mg by mouth at bedtime.    [provider]  metFORMIN (GLUCOPHAGE) 500 MG tablet Take 2 tablets (1,000 mg total) by mouth 2 (two) times daily with a meal. 06/24/20   [name], DO  metoprolol succinate (TOPROL-XL) 50 MG 24 hr tablet TAKE 1 TABLET TWICE DAILY 06/05/20   [name], MD  OXYGEN Inhale into the lungs at bedtime. Inhale 2L into lungs    [provider]  potassium chloride (KLOR-CON) 10 MEQ tablet Take 10 mEq by mouth daily as needed (low potassium).    [provider]  spironolactone (ALDACTONE) 25 MG tablet Take 0.5 tablets (12.5 mg total) by mouth daily. 10/20/20   Shary Key, DO  sulfamethoxazole-trimethoprim (BACTRIM DS) 800-160 MG tablet Take 1 tablet by mouth 2 (two) times daily for 3 days. 12/30/20 01/02/21  Wieters, Hallie C, PA-C  Ubiquinone (ULTRA COQ10 PO) Take 1 tablet by mouth 3 (three) times a week.    [provider]    Family History Family History  Problem Relation Age of Onset  . Coronary artery disease Father        Died age 69  . Heart attack Father   . Diabetes Father   . Coronary artery disease Mother        Died age 36  . Heart attack Mother   . Diabetes Mother   . Parkinson's disease Sister   . Heart disease Sister   . Hepatitis C Sister   . Diabetes Sister   . Diabetes Son 37       T1DM  . Healthy Daughter   . Diabetes Sister   . Heart disease Sister   . Diabetes Sister   . Heart disease Sister   . Breast cancer Neg Hx     Social History Social History   Tobacco Use  . Smoking status: Never Smoker  . Smokeless tobacco: Never Used  . Tobacco comment: exposed to smoke during child hood (parents)  Vaping Use  . Vaping Use: Never used  Substance Use Topics  . Alcohol use: No    Alcohol/week: 0.0 standard drinks  . Drug use: No     Allergies   Aspirin, Penicillins, Prempro [conj estrog-medroxyprogest ace], Ace inhibitors, and Simvastatin   Review of Systems Review of Systems  Defer to HPI    Physical Exam Triage Vital Signs ED Triage Vitals  Enc Vitals Group     BP 01/01/21 1142 (!) 115/43     Pulse Rate 01/01/21 1142 79     Resp 01/01/21 1142 17     Temp 01/01/21 1142 98.4 F (36.9 C)     Temp Source 01/01/21 1142 Oral     SpO2 01/01/21 1142 99 %     Weight --      Height --      Head Circumference --      Peak Flow --      Pain Score 01/01/21 1140 0     Pain Loc --      Pain Edu? --      Excl. in [place]? --    No data found.  Updated Vital Signs BP [name] Kitchen)  115/43 (BP Location: Right Arm)   Pulse 79   Temp 98.4 F (36.9 C) (Oral)   Resp 17   LMP 08/01/2000 (Approximate)   SpO2  99%   Visual Acuity Right Eye Distance:   Left Eye Distance:   Bilateral Distance:    Right Eye Near:   Left Eye Near:    Bilateral Near:     Physical Exam Constitutional:      Appearance: Normal appearance. She is normal weight.  HENT:     Head: Normocephalic.  Cardiovascular:     Rate and Rhythm: Normal rate and regular rhythm.     Pulses: Normal pulses.     Heart sounds: Normal heart sounds.  Pulmonary:     Effort: Pulmonary effort is normal.     Comments: Lungs sounds diminished  Abdominal:     General: Abdomen is flat. Bowel sounds are normal.     Palpations: Abdomen is soft.     Tenderness: There is abdominal tenderness in the suprapubic area. There is no right CVA tenderness, left CVA tenderness or guarding.  Musculoskeletal:        General: Normal range of motion.     Cervical back: Normal range of motion.  Skin:    General: Skin is warm and dry.  Neurological:     Mental Status: She is alert and oriented to person, place, and time. Mental status is at baseline.  Psychiatric:        Mood and Affect: Mood normal.        Behavior: Behavior normal.        Thought Content: Thought content normal.        Judgment: Judgment normal.      UC Treatments / Results  Labs (all labs ordered are listed, but only abnormal results are displayed) Labs Reviewed  COMPREHENSIVE METABOLIC PANEL  CBC  POCT URINALYSIS DIPSTICK, ED / UC  CERVICOVAGINAL ANCILLARY ONLY    EKG   Radiology No results found.  Procedures Procedures (including critical care time)  Medications Ordered in UC Medications - No data to display  Initial Impression / Assessment and Plan / UC Course  I have reviewed the triage vital signs and the nursing notes.  Pertinent labs & imaging results that were available during my care of the patient were reviewed by me and considered in my medical decision making (see chart for details).   Kidney Stone  No change in urinalysis seen on 6/1, patient  stable at this time, plan to move forward with treatment as possible kidney stone with follow up with PCP, unable to definitively verify kidney stone in UC setting, discussed with patient, patient declined treatment  and has elected to be evaluated in emergency department   1. Urinalysis- + hgb and leukocytes  2. CMP, CBC- discontinued  3. Vaginal swab for yeast and BV- pending  Final Clinical Impressions(s) / UC Diagnoses   Final diagnoses:  None   Discharge Instructions   None    ED Prescriptions    None     PDMP not reviewed this encounter.   Hans Eden, NP 01/01/21 1337

## 2021-01-01 NOTE — Discharge Instructions (Addendum)
Urinalysis today showed Veronica Shaw blood cells and some blood  You have chosen to go to the hospital for further evaluation

## 2021-01-01 NOTE — ED Triage Notes (Signed)
Pt here from UC with concern of UTI/kidnry stone. Pt also endorses burning with urination. Pt in NAD.

## 2021-01-01 NOTE — ED Provider Notes (Addendum)
[facility] EMERGENCY DEPARTMENT Provider Note   CSN: [id number] Arrival date & time: 01/01/21  1347     History Chief Complaint  Patient presents with  . Back Pain    Veronica Shaw is a 70 y.o. female.  HPI 70 year old female presents with right-sided back pain as well as abdominal pain.  Symptoms have been going on for multiple days.  She was treated for UTI at urgent care on 6/1 but these antibiotics were stopped because the culture came back negative.  Still having dysuria and frequency in addition to flank and abdominal pain.  Pain right now is about a 3 out of 10 but it comes and goes.  This morning she thought she might have a fever but her temperature was 96.  No vomiting.  Past Medical History:  Diagnosis Date  . Acute on chronic systolic CHF (congestive heart failure), NYHA class 4 ([place]) 10/19/2020  . Asthma   . Back pain 09/18/2019  . Cervical disc disorder with radiculopathy of cervical region 09/24/2018  . Chronic systolic CHF (congestive heart failure) ([place])    a. cMRI 4/15: EF 34% and findings - c/w NICM, normal RV size and function (RVEF 61%), Mild MR // b. Echo 2/15:  EF 30-35%, diff HK, ant-sept AK, Gr 2 DD, mild MR, trivial TR  //  c. Echo 5/17: EF 20-25%, severe diffuse HK, marked systolic dyssynchrony, grade 1 diastolic dysfunction, mild MR  //  d. RHC 5/17: Fick CO 2.9, RVSP 19, PASP 15, PW mean 2, low filing pressures and preserved CO   . Cognitive impairment 10/19/2017   MOCA was administered with a score of 23/30  . Diabetes mellitus   . Diabetic peripheral neuropathy ([place]) 12/09/2011  . Flu 10/17/2017  . Gastritis   . History of echocardiogram    Echo 6/18: EF 30-35, diffuse HK, grade 1 diastolic dysfunction, trivial MR, mild LAE, mild TR, no pericardial effusion  . History of nuclear stress test    Myoview 5/18: EF 49, no ischemia, inferoseptal defect c/w LBBB artifact (intermediate risk due to EF < 50).  [name] Kitchen HTN (hypertension)   .  Hyperlipidemia   . Hyperlipidemia associated with type 2 diabetes mellitus ([place]) 03/05/2019  . Hypertension associated with diabetes ([place]) 09/28/2006   Qualifier: Diagnosis of  By: [name]    . Major depression 03/31/2013  . Melena 08/21/2020  . NICM (nonischemic cardiomyopathy) ([place])    a. Nuclear 5/13: Normal stress nuclear study. LV Ejection Fraction: 58%  //  b. LHC 10/14: Minor luminal irregularity in prox LAD, EF 35%   . Parkinson disease ([place])   . Plantar fasciitis   . Plantar fasciitis, bilateral 06/15/2012  . Rosacea 05/29/2009   Qualifier: Diagnosis of  By: [name] MD, Rodman Key    . Sensorineural hearing loss (SNHL), bilateral 01/04/2017  . Sleep apnea    was retested and no longer had it and so d/c CPAP  . Syncope   . Thoracic or lumbosacral neuritis or radiculitis, unspecified 07/03/2013  . Tinnitus, bilateral 01/04/2017  . Type II diabetes mellitus with complication ([place]) 6/81/2751   Diabetic eye exam done by [facility]: Dr. Prudencio Burly on 11/08/13: no diabetic retinopathy. Repeat in 1 year    . Urticaria     Patient Active Problem List   Diagnosis Date Noted  . Acute on chronic systolic CHF (congestive heart failure), NYHA class 4 ([place]) 10/19/2020  . Chronic HFrEF (heart failure with reduced ejection fraction) ([place])   .  Cervical dystonia 10/07/2020  . Advanced care planning/counseling discussion 08/21/2020  . Dementia without behavioral disturbance ([place]) 08/21/2020  . Vision impairment, Right Eye 08/21/2020  . Chronic kidney disease, stage 3a ([place]) 08/18/2020  . Frequent urination 07/30/2020  . Chronic rhinitis 12/18/2019  . Chronic respiratory failure with hypoxia ([place]) 12/18/2019  . Hyperlipidemia associated with type 2 diabetes mellitus ([place]) 03/05/2019  . Falls frequently   . Asthma 08/03/2017  . Osteopenia 07/10/2015  . Mild persistent asthma in adult without complication 46/80/3212  . NICM (nonischemic cardiomyopathy) ([place]) 04/23/2014  . Arthritis or  polyarthritis, rheumatoid ([place]) 03/12/2014  . ICD (implantable cardioverter-defibrillator), dual, in situ 03/06/2014  . Heart Failure with Recovered Ejection Fraction, G2DD 05/31/2013  . Parkinson disease ([place]) 05/21/2013  . Vertigo 03/03/2013  . Gastroesophageal reflux disease without esophagitis 09/27/2012  . Dyspnea 09/19/2012  . Trigger point with neck pain 04/12/2012  . 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  . Diabetic peripheral neuropathy ([place]) 12/09/2011  . Chronic urticaria 05/27/2011  . Sinus tachycardia ([place]) 05/27/2011  . 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 05/20/2011  . Type II diabetes mellitus with complication ([place]) 24/82/5003  . Hypertension associated with diabetes ([place]) 09/28/2006    Past Surgical History:  Procedure Laterality Date  . BREAST EXCISIONAL BIOPSY Left 1970   benign cyst  . BREAST SURGERY Left   . BUNIONECTOMY    . CARDIAC CATHETERIZATION N/A 12/16/2015   Procedure: Right Heart Cath;  Surgeon: Sherren Mocha, MD;  Location: [facility];  Service: Cardiovascular;  Laterality: N/A;  . CHOLECYSTECTOMY    . eye lid surgery Bilateral 05/09/2019  . IMPLANTABLE CARDIOVERTER DEFIBRILLATOR IMPLANT  11-25-13   MDT dual chamber ICD implanted by [name] for primary prevention  . IMPLANTABLE CARDIOVERTER DEFIBRILLATOR IMPLANT N/A 11/25/2013   Procedure: IMPLANTABLE CARDIOVERTER DEFIBRILLATOR IMPLANT;  Surgeon: [name], MD;  Location: [facility] CATH LAB;  Service: Cardiovascular;  Laterality: N/A;  . LEFT AND RIGHT HEART CATHETERIZATION WITH CORONARY ANGIOGRAM N/A 05/31/2013   Procedure: LEFT AND RIGHT HEART CATHETERIZATION WITH CORONARY ANGIOGRAM;  Surgeon: [name], MD;  Location: [facility] C[facility] LAB;  Service: Cardiovascular;  Laterality: N/A;  . TONSILLECTOMY    . TUBAL LIGATION       OB History   No obstetric history on file.     Family History  Problem Relation Age of Onset  . Coronary artery disease Father        Died age 36  . Heart attack Father   .  Diabetes Father   . Coronary artery disease Mother        Died age 47  . Heart attack Mother   . Diabetes Mother   . Parkinson's disease Sister   . Heart disease Sister   . Hepatitis C Sister   . Diabetes Sister   . Diabetes Son 37       T1DM  . Healthy Daughter   . Diabetes Sister   . Heart disease Sister   . Diabetes Sister   . Heart disease Sister   . Breast cancer Neg Hx     Social History   Tobacco Use  . Smoking status: Never Smoker  . Smokeless tobacco: Never Used  . Tobacco comment: exposed to smoke during child hood (parents)  Vaping Use  . Vaping Use: Never used  Substance Use Topics  . Alcohol use: No    Alcohol/week: 0.0 standard drinks  . Drug use: No    Home Medications Prior to Admission medications  Medication Sig Start Date End Date Taking? Authorizing Provider  cephALEXin (KEFLEX) 250 MG capsule Take 1 capsule (250 mg total) by mouth 3 (three) times daily for 10 days. 01/01/21 01/11/21 Yes Sherwood Gambler, MD  APPLE CIDER VINEGAR PO Take 1 capsule by mouth daily.    [provider]  Biotin 5000 MCG CAPS Take 1 tablet by mouth daily.     [provider]  budesonide-formoterol (SYMBICORT) 160-4.5 MCG/ACT inhaler Inhale 2 puffs into the lungs 2 (two) times daily. 10/24/19   Lauraine Rinne, NP  carbidopa-levodopa (SINEMET IR) 25-100 MG tablet TAKE 1 TABLET BY MOUTH THREE TIMES DAILY 09/28/20   Tat, Eustace Quail, DO  famotidine (PEPCID) 20 MG tablet TAKE 1 TABLET TWICE DAILY 06/17/19   Caroline More, DO  fluticasone (FLONASE SENSIMIST) 27.5 MCG/SPRAY nasal spray Place 2 sprays into the nose daily. 12/17/20   Parrett, Fonnie Mu, NP  furosemide (LASIX) 40 MG tablet Take 1 tablet (40 mg total) by mouth daily. 10/20/20   Shary Key, DO  levalbuterol (XOPENEX) 0.63 MG/3ML nebulizer solution USE 1 VIAL IN NEBULIZER EVERY 4 TO 6 HOURS AS NEEDED FOR WHEEZING AND FOR SHORTNESS OF BREATH 10/14/20   Marshell Garfinkel, MD  losartan (COZAAR) 25 MG tablet TAKE  1 TABLET EVERY DAY 04/01/20   Sherren Mocha, MD  meclizine (ANTIVERT) 12.5 MG tablet Take 1 tablet (12.5 mg total) by mouth daily as needed for dizziness. Should not be long term. 10/02/20   [name], DO  melatonin 5 MG TABS Take 5 mg by mouth at bedtime.    [provider]  metFORMIN (GLUCOPHAGE) 500 MG tablet Take 2 tablets (1,000 mg total) by mouth 2 (two) times daily with a meal. 06/24/20   [name], DO  metoprolol succinate (TOPROL-XL) 50 MG 24 hr tablet TAKE 1 TABLET TWICE DAILY 06/05/20   [name], MD  OXYGEN Inhale into the lungs at bedtime. Inhale 2L into lungs    [provider]  potassium chloride (KLOR-CON) 10 MEQ tablet Take 10 mEq by mouth daily as needed (low potassium).    [provider]  spironolactone (ALDACTONE) 25 MG tablet Take 0.5 tablets (12.5 mg total) by mouth daily. 10/20/20   Shary Key, DO  sulfamethoxazole-trimethoprim (BACTRIM DS) 800-160 MG tablet Take 1 tablet by mouth 2 (two) times daily for 3 days. 12/30/20 01/02/21  Wieters, Hallie C, PA-C  Ubiquinone (ULTRA COQ10 PO) Take 1 tablet by mouth 3 (three) times a week.    [provider]    Allergies    Aspirin, Penicillins, Prempro [conj estrog-medroxyprogest ace], Ace inhibitors, and Simvastatin  Review of Systems   Review of Systems  Constitutional: Negative for fever.  Gastrointestinal: Positive for abdominal pain. Negative for vomiting.  Genitourinary: Positive for dysuria, flank pain, frequency and hematuria.  All other systems reviewed and are negative.   Physical Exam Updated Vital Signs BP (!) 97/48 (BP Location: Left Arm)   Pulse 71   Temp 98.2 F (36.8 C) (Oral)   Resp 20   LMP 08/01/2000 (Approximate)   SpO2 95%   Physical Exam Vitals and nursing note reviewed.  Constitutional:      General: She is not in acute distress.    Appearance: She is well-developed. She is not ill-appearing or diaphoretic.  HENT:     Head:  Normocephalic and atraumatic.     Right Ear: External ear normal.     Left Ear: External ear normal.  Nose: Nose normal.  Eyes:     General:        Right eye: No discharge.        Left eye: No discharge.  Cardiovascular:     Rate and Rhythm: Normal rate and regular rhythm.     Heart sounds: Normal heart sounds.  Pulmonary:     Effort: Pulmonary effort is normal.     Breath sounds: Normal breath sounds.  Abdominal:     Palpations: Abdomen is soft.     Tenderness: There is no abdominal tenderness. There is right CVA tenderness.  Skin:    General: Skin is warm and dry.  Neurological:     Mental Status: She is alert.  Psychiatric:        Mood and Affect: Mood is not anxious.     ED Results / Procedures / Treatments   Labs (all labs ordered are listed, but only abnormal results are displayed) Labs Reviewed  BASIC METABOLIC PANEL - Abnormal; Notable for the following components:      Result Value   Sodium 133 (*)    Glucose, Bld 255 (*)    BUN 24 (*)    Creatinine, Ser 1.72 (*)    GFR, Estimated 31 (*)    All other components within normal limits  URINALYSIS, ROUTINE W REFLEX MICROSCOPIC - Abnormal; Notable for the following components:   Color, Urine STRAW (*)    Leukocytes,Ua MODERATE (*)    All other components within normal limits  URINE CULTURE  CBC WITH DIFFERENTIAL/PLATELET    EKG None  Radiology CT Renal Stone Study  Result Date: 01/01/2021 CLINICAL DATA:  Right flank pain EXAM: CT ABDOMEN AND PELVIS WITHOUT CONTRAST TECHNIQUE: Multidetector CT imaging of the abdomen and pelvis was performed following the standard protocol without IV contrast. COMPARISON:  None. FINDINGS: Lower chest: Lung bases are clear. No effusions. Heart is normal size. Hepatobiliary: Prior cholecystectomy. Scattered calcifications in the liver. No suspicious hepatic abnormality. Pancreas: No focal abnormality or ductal dilatation. Spleen: No focal abnormality.  Normal size.  Adrenals/Urinary Tract: Left kidney is mildly atrophic, stable since prior study. No hydronephrosis. No renal or adrenal mass. Urinary bladder unremarkable. Stomach/Bowel: Sigmoid diverticulosis. No active diverticulitis. Stomach and small bowel decompressed, unremarkable. Normal appendix. Vascular/Lymphatic: Scattered aortic calcifications. No evidence of aneurysm or adenopathy. Reproductive: Uterus and adnexa unremarkable.  No mass. Other: No free fluid or free air. Musculoskeletal: Degenerative disc and facet disease. No acute bony abnormality. IMPRESSION: Sigmoid diverticulosis.  No active diverticulitis. No renal or ureteral stones.  No hydronephrosis. No acute findings in the abdomen or pelvis. Electronically Signed   By: Rolm Baptise M.D.   On: 01/01/2021 21:44    Procedures Procedures   Medications Ordered in ED Medications  cefTRIAXone (ROCEPHIN) 1 g in sodium chloride 0.9 % 100 mL IVPB (0 g Intravenous Stopped 01/01/21 2333)  sodium chloride 0.9 % bolus 500 mL (500 mLs Intravenous New Bag/Given 01/01/21 2302)    ED Course  I have reviewed the triage vital signs and the nursing notes.  Pertinent labs & imaging results that were available during my care of the patient were reviewed by me and considered in my medical decision making (see chart for details).    MDM Rules/Calculators/A&P                          Patient CT scan does not show an obvious kidney stone.  Her creatinine is bumped from 1.7-1.2  though she states she has hardly drink today and recently has had decreased p.o. intake.  We will give small bolus of fluids given her chronic CHF.  Her white blood cell count is okay and while she had 1 soft blood pressure of 97/48, it has come up with fluids and she feels well enough for discharge.  Given her dysuria, I still think this is probably urinary tract infection related despite the recent negative culture and will resend culture and put on Keflex.  She has tolerated Keflex in the  past.  Given a dose of Rocephin.  We discussed return precautions but she otherwise appears stable for discharge home with close PCP follow-up. BP has come up into 110s Final Clinical Impression(s) / ED Diagnoses Final diagnoses:  Pyelonephritis    Rx / DC Orders ED Discharge Orders         Ordered    cephALEXin (KEFLEX) 250 MG capsule  3 times daily        01/01/21 2339           Sherwood Gambler, MD 01/01/21 0569    Sherwood Gambler, MD 01/01/21 2344

## 2021-01-01 NOTE — ED Triage Notes (Signed)
Pt presents with SOB xs 3 days. States was seen at [facility] on 6/1 and treated for UTI but told yesterday to stop antibiotic due to culture coming back negative.

## 2021-01-01 NOTE — ED Provider Notes (Signed)
Emergency Medicine Provider Triage Evaluation Note  Veronica Shaw , a 70 y.o. female  was evaluated in triage.  Pt complains of possible UTI and kidney stones.  Sent by urgent care.  Has been having right-sided flank pain since the weekend.  Denies any fevers.  Review of Systems  Positive: Flank pain, dysuria Negative: fever  Physical Exam  BP (!) 116/43 (BP Location: Left Arm)   Pulse 87   Temp 98 F (36.7 C) (Oral)   Resp 16   LMP 08/01/2000 (Approximate)   SpO2 98%  Gen:   Awake, no distress   Resp:  Normal effort  MSK:   Moves extremities without difficulty  Other:  R flank pain  Medical Decision Making  Medically screening exam initiated at 4:20 PM.  Appropriate orders placed.  Veronica Shaw was informed that the remainder of the evaluation will be completed by another provider, this initial triage assessment does not replace that evaluation, and the importance of remaining in the ED until their evaluation is complete.  Urinalysis and labs ordered   Veronica Heady, PA-C 01/01/21 1622    Shaw, Veronica Allegra, MD 01/01/21 1739

## 2021-01-01 NOTE — Discharge Instructions (Addendum)
If you develop worsening, continued, or recurrent abdominal pain, uncontrolled vomiting, fever, chest or back pain, or any other new/concerning symptoms then return to the ER for evaluation.   Your kidneys indicated dehydration today. Be sure to keep up with fluids and follow up closely with your primary care doctor.

## 2021-01-02 NOTE — ED Notes (Signed)
Patient verbalizes understanding of discharge instructions. Prescriptions and follow-up care reviewed. Opportunity for questioning and answers were provided. Armband removed by staff, pt discharged from ED ambulatory.  

## 2021-01-03 LAB — URINE CULTURE: Culture: <10000 — AB

## 2021-01-04 ENCOUNTER — Telehealth: Payer: Self-pay

## 2021-01-04 LAB — CERVICOVAGINAL ANCILLARY ONLY
Bacterial Vaginitis (gardnerella): NEGATIVE
Candida Glabrata: NEGATIVE
Candida Vaginitis: POSITIVE — AB
Comment: NEGATIVE
Comment: NEGATIVE
Comment: NEGATIVE
Comment: NEGATIVE
Trichomonas: NEGATIVE

## 2021-01-04 NOTE — Telephone Encounter (Signed)
Patient calls nurse line stating she was seen at urgent recently and just saw her results on mychart. Patient would like for PCP to review and treat as needed. Patient does not want to contact urgent care. Will forward to PCP.

## 2021-01-06 ENCOUNTER — Other Ambulatory Visit: Payer: Self-pay | Admitting: Family Medicine

## 2021-01-06 ENCOUNTER — Telehealth: Payer: Self-pay

## 2021-01-06 DIAGNOSIS — B379 Candidiasis, unspecified: Secondary | ICD-10-CM

## 2021-01-06 MED ORDER — FLUCONAZOLE 150 MG PO TABS: 150.0000 mg | ORAL_TABLET | ORAL | 0 refills | Status: DC

## 2021-01-06 NOTE — Telephone Encounter (Signed)
Called and left voice message for patient to call [facility] for message form Dr. Higinio Plan.  Veronica Shaw, [place]

## 2021-01-06 NOTE — Telephone Encounter (Signed)
Patient LVM on nurse line asking for an order for a mammogram. Patient reports she is having a hard time scheduling with the breast center. Patient states she can not be located in their system. I feel an order would be helpful for her so she can call to schedule. Please advise.

## 2021-01-06 NOTE — Telephone Encounter (Signed)
She was positive for vaginal candida (can cause some vaginal itching/irritation and subsequent dysuria), sent in diflucan to treat if she has finished her antibiotics for UTI. Her urine culture was minimal growth, however complicated by recent antibiotic course. Lastly, her kidney function was slightly declined from her previous. She should have these repeated in a few weeks and stay hydrated.   [name], DO

## 2021-01-07 ENCOUNTER — Other Ambulatory Visit: Payer: Self-pay | Admitting: Family Medicine

## 2021-01-07 DIAGNOSIS — Z1231 Encounter for screening mammogram for malignant neoplasm of breast: Secondary | ICD-10-CM

## 2021-01-07 NOTE — Telephone Encounter (Signed)
Patient contacted and advised of order placement. Advised patient this should allow her to easily schedule with the breast center. Patient to call back if still having issues.

## 2021-01-07 NOTE — Telephone Encounter (Signed)
Mammogram order placed

## 2021-01-09 ENCOUNTER — Ambulatory Visit
Admission: RE | Admit: 2021-01-09 | Discharge: 2021-01-09 | Disposition: A | Discharge status: Home / Self Care | Payer: Medicare Other | Source: Ambulatory Visit | Attending: Family Medicine | Admitting: Family Medicine

## 2021-01-09 DIAGNOSIS — Z1231 Encounter for screening mammogram for malignant neoplasm of breast: Secondary | ICD-10-CM | POA: Diagnosis not present

## 2021-01-13 ENCOUNTER — Other Ambulatory Visit: Payer: Self-pay | Admitting: Internal Medicine

## 2021-01-13 ENCOUNTER — Other Ambulatory Visit: Payer: Self-pay | Admitting: Family Medicine

## 2021-01-13 DIAGNOSIS — R928 Other abnormal and inconclusive findings on diagnostic imaging of breast: Secondary | ICD-10-CM

## 2021-01-18 ENCOUNTER — Encounter (HOSPITAL_COMMUNITY): Payer: Self-pay | Admitting: *Deleted

## 2021-01-18 ENCOUNTER — Ambulatory Visit (HOSPITAL_COMMUNITY)
Admission: EM | Admit: 2021-01-18 | Discharge: 2021-01-18 | Disposition: A | Discharge status: Other Acute Inpatient Hospital | Payer: Medicare Other | Attending: Family Medicine | Admitting: Family Medicine

## 2021-01-18 ENCOUNTER — Emergency Department (HOSPITAL_COMMUNITY)
Admission: EM | Admit: 2021-01-18 | Discharge: 2021-01-19 | Disposition: A | Discharge status: Home / Self Care | Payer: Medicare Other | Attending: Emergency Medicine | Admitting: Emergency Medicine

## 2021-01-18 ENCOUNTER — Encounter (HOSPITAL_COMMUNITY): Payer: Self-pay | Admitting: Emergency Medicine

## 2021-01-18 ENCOUNTER — Other Ambulatory Visit: Payer: Self-pay

## 2021-01-18 ENCOUNTER — Emergency Department (HOSPITAL_COMMUNITY): Payer: Medicare Other

## 2021-01-18 DIAGNOSIS — R109 Unspecified abdominal pain: Secondary | ICD-10-CM | POA: Diagnosis not present

## 2021-01-18 DIAGNOSIS — N3001 Acute cystitis with hematuria: Secondary | ICD-10-CM | POA: Insufficient documentation

## 2021-01-18 DIAGNOSIS — E1140 Type 2 diabetes mellitus with diabetic neuropathy, unspecified: Secondary | ICD-10-CM | POA: Diagnosis not present

## 2021-01-18 DIAGNOSIS — I130 Hypertensive heart and chronic kidney disease with heart failure and stage 1 through stage 4 chronic kidney disease, or unspecified chronic kidney disease: Secondary | ICD-10-CM | POA: Diagnosis not present

## 2021-01-18 DIAGNOSIS — Z7952 Long term (current) use of systemic steroids: Secondary | ICD-10-CM | POA: Insufficient documentation

## 2021-01-18 DIAGNOSIS — N1831 Chronic kidney disease, stage 3a: Secondary | ICD-10-CM | POA: Insufficient documentation

## 2021-01-18 DIAGNOSIS — I5023 Acute on chronic systolic (congestive) heart failure: Secondary | ICD-10-CM | POA: Diagnosis not present

## 2021-01-18 DIAGNOSIS — E1169 Type 2 diabetes mellitus with other specified complication: Secondary | ICD-10-CM | POA: Diagnosis not present

## 2021-01-18 DIAGNOSIS — E785 Hyperlipidemia, unspecified: Secondary | ICD-10-CM | POA: Insufficient documentation

## 2021-01-18 DIAGNOSIS — J45909 Unspecified asthma, uncomplicated: Secondary | ICD-10-CM | POA: Diagnosis not present

## 2021-01-18 DIAGNOSIS — Z7984 Long term (current) use of oral hypoglycemic drugs: Secondary | ICD-10-CM | POA: Diagnosis not present

## 2021-01-18 DIAGNOSIS — Z79899 Other long term (current) drug therapy: Secondary | ICD-10-CM | POA: Insufficient documentation

## 2021-01-18 DIAGNOSIS — R319 Hematuria, unspecified: Secondary | ICD-10-CM

## 2021-01-18 DIAGNOSIS — R300 Dysuria: Secondary | ICD-10-CM | POA: Diagnosis not present

## 2021-01-18 DIAGNOSIS — F0390 Unspecified dementia without behavioral disturbance: Secondary | ICD-10-CM | POA: Insufficient documentation

## 2021-01-18 DIAGNOSIS — G20 Parkinson's disease: Secondary | ICD-10-CM | POA: Diagnosis not present

## 2021-01-18 DIAGNOSIS — R1030 Lower abdominal pain, unspecified: Secondary | ICD-10-CM | POA: Diagnosis present

## 2021-01-18 LAB — COMPREHENSIVE METABOLIC PANEL
ALT: 16 U/L (ref 0–44)
AST: 20 U/L (ref 15–41)
Albumin: 4.1 g/dL (ref 3.5–5.0)
Alkaline Phosphatase: 99 U/L (ref 38–126)
Anion gap: 9 (ref 5–15)
BUN: 23 mg/dL (ref 8–23)
CO2: 27 mmol/L (ref 22–32)
Calcium: 10.3 mg/dL (ref 8.9–10.3)
Chloride: 101 mmol/L (ref 98–111)
Creatinine, Ser: 1.39 mg/dL — ABNORMAL HIGH (ref 0.44–1.00)
GFR, Estimated: 41 mL/min — ABNORMAL LOW (ref >60)
Glucose, Bld: 175 mg/dL — ABNORMAL HIGH (ref 70–99)
Potassium: 4.6 mmol/L (ref 3.5–5.1)
Sodium: 137 mmol/L (ref 135–145)
Total Bilirubin: 1 mg/dL (ref 0.3–1.2)
Total Protein: 7.5 g/dL (ref 6.5–8.1)

## 2021-01-18 LAB — CBC WITH DIFFERENTIAL/PLATELET
Abs Immature Granulocytes: 0.02 10*3/uL (ref 0.00–0.07)
Basophils Absolute: 0.1 10*3/uL (ref 0.0–0.1)
Basophils Relative: 1 %
Eosinophils Absolute: 0.1 10*3/uL (ref 0.0–0.5)
Eosinophils Relative: 1 %
HCT: 38.6 % (ref 36.0–46.0)
Hemoglobin: 12.7 g/dL (ref 12.0–15.0)
Immature Granulocytes: 0 %
Lymphocytes Relative: 14 %
Lymphs Abs: 1.5 10*3/uL (ref 0.7–4.0)
MCH: 30.5 pg (ref 26.0–34.0)
MCHC: 32.9 g/dL (ref 30.0–36.0)
MCV: 92.8 fL (ref 80.0–100.0)
Monocytes Absolute: 0.9 10*3/uL (ref 0.1–1.0)
Monocytes Relative: 8 %
Neutro Abs: 8.3 10*3/uL — ABNORMAL HIGH (ref 1.7–7.7)
Neutrophils Relative %: 76 %
Platelets: 239 10*3/uL (ref 150–400)
RBC: 4.16 MIL/uL (ref 3.87–5.11)
RDW: 11.9 % (ref 11.5–15.5)
WBC: 10.8 10*3/uL — ABNORMAL HIGH (ref 4.0–10.5)
nRBC: 0 % (ref 0.0–0.2)

## 2021-01-18 LAB — POCT URINALYSIS DIPSTICK, ED / UC
Glucose, UA: NEGATIVE mg/dL
Nitrite: NEGATIVE
Protein, ur: >=300 mg/dL — AB
Specific Gravity, Urine: 1.015 (ref 1.005–1.030)
Urobilinogen, UA: 0.2 mg/dL (ref 0.0–1.0)
pH: 6 (ref 5.0–8.0)

## 2021-01-18 LAB — LIPASE, BLOOD: Lipase: 43 U/L (ref 11–51)

## 2021-01-18 MED ORDER — IOHEXOL 300 MG/ML  SOLN
80.0000 mL | Freq: Once | INTRAMUSCULAR | Status: AC | PRN
  Administered 2021-01-18: 80 mL via INTRAVENOUS

## 2021-01-18 NOTE — ED Provider Notes (Signed)
Emergency Medicine Provider Triage Evaluation Note  Veronica Shaw , a 70 y.o. female  was evaluated in triage.  Pt complains of possible UTI.  Was seen here 3 weeks ago.  Has been on 2 rounds antibiotics.  Symptoms resolved.  Yesterday started with diarrhea, lower abdominal cramping and right upper quadrant pain.  States she started again with hematuria, dysuria.  Has had multiple rounds of diarrhea melena blood per rectum.  No sick contacts.  She is unsure which antibiotic she was recently on. States yesterday she had some chills and took her temperature which was 99.  Has had nausea without vomiting. No hx of kidney stones  Review of Systems  Positive: Hematuria, dysuria, diarrhea, abdominal pain, Negative: Emesis, chest pain, shortness of  Physical Exam  BP (!) 120/42 (BP Location: Right Arm)   Pulse 94   Temp 98.6 F (37 C)   Resp 17   LMP 08/01/2000 (Approximate)   SpO2 98%  Gen:   Awake, no distress   Resp:  Normal effort  ABD:   Diffusely tender lower abdomen, worse to right lower quadrant MSK:   Moves extremities without difficulty  Other:    Medical Decision Making  Medically screening exam initiated at 10:52 AM.  Appropriate orders placed.  Veronica Shaw was informed that the remainder of the evaluation will be completed by another provider, this initial triage assessment does not replace that evaluation, and the importance of remaining in the ED until their evaluation is complete.  Fever, N/D/dysuria   Nettie Elm, PA-C 01/18/21 1053    Charlesetta Shanks, MD 01/27/21 [phone]

## 2021-01-18 NOTE — ED Provider Notes (Signed)
[facility]    CSN: [id number] Arrival date & time: 01/18/21  6294      History   Chief Complaint Chief Complaint  Patient presents with   Dysuria    HPI Veronica Shaw is a 70 y.o. female.   Patient here for evaluation of possible UTI.  Reports symptoms abdominal cramping, dysuria, hematuria, headaches, and bilateral lower leg cramping and bruising.  Patient was seen several times approximately 2 weeks ago for similar symptoms and was treated with Bactrim and then Rocephin and Keflex for a UTI.  Reports symptoms improved and then returned this morning suddenly.  Denies any trauma, injury, or other precipitating event.  Denies any specific alleviating or aggravating factors.  Denies any fevers, chest pain, shortness of breath, N/V/D, numbness, tingling, weakness, or headaches.     The history is provided by the patient.  Dysuria Associated symptoms: abdominal pain    Past Medical History:  Diagnosis Date   Acute on chronic systolic CHF (congestive heart failure), NYHA class 4 (HCC) 10/19/2020   Asthma    Back pain 09/18/2019   Cervical disc disorder with radiculopathy of cervical region 7/65/4650   Chronic systolic CHF (congestive heart failure) ([place])    a. cMRI 4/15: EF 34% and findings - c/w NICM, normal RV size and function (RVEF 61%), Mild MR // b. Echo 2/15:  EF 30-35%, diff HK, ant-sept AK, Gr 2 DD, mild MR, trivial TR  //  c. Echo 5/17: EF 20-25%, severe diffuse HK, marked systolic dyssynchrony, grade 1 diastolic dysfunction, mild MR  //  d. [place] 5/17: Fick CO 2.9, RVSP 19, PASP 15, PW mean 2, low filing pressures and preserved CO    Cognitive impairment 10/19/2017   MOCA was administered with a score of 23/30   Diabetes mellitus    Diabetic peripheral neuropathy ([place]) 12/09/2011   Flu 10/17/2017   Gastritis    History of echocardiogram    Echo 6/18: EF 30-35, diffuse HK, grade 1 diastolic dysfunction, trivial MR, mild LAE, mild TR, no pericardial effusion    History of nuclear stress test    Myoview 5/18: EF 49, no ischemia, inferoseptal defect c/w LBBB artifact (intermediate risk due to EF < 50).   HTN (hypertension)    Hyperlipidemia    Hyperlipidemia associated with type 2 diabetes mellitus ([place]) 03/05/2019   Hypertension associated with diabetes ([place]) 09/28/2006   Qualifier: Diagnosis of  By: [name]     Major depression 03/31/2013   Melena 08/21/2020   NICM (nonischemic cardiomyopathy) ([place])    a. Nuclear 5/13: Normal stress nuclear study. LV Ejection Fraction: 58%  //  b. LHC 10/14: Minor luminal irregularity in prox LAD, EF 35%    Parkinson disease (HCC)    Plantar fasciitis    Plantar fasciitis, bilateral 06/15/2012   Rosacea 05/29/2009   Qualifier: Diagnosis of  By: [name] MD, [name] hearing loss (SNHL), bilateral 01/04/2017   Sleep apnea    was retested and no longer had it and so d/c CPAP   Syncope    Thoracic or lumbosacral neuritis or radiculitis, unspecified 07/03/2013   Tinnitus, bilateral 01/04/2017   Type II diabetes mellitus with complication ([place]) 3/54/6568   Diabetic eye exam done by [facility]: Dr. Prudencio Burly on 11/08/13: no diabetic retinopathy. Repeat in 1 year     Urticaria     Patient Active Problem List   Diagnosis Date Noted   Acute on chronic systolic CHF (congestive heart  failure), NYHA class 4 ([place]) 10/19/2020   Chronic HFrEF (heart failure with reduced ejection fraction) ([place])    Cervical dystonia 10/07/2020   Advanced care planning/counseling discussion 08/21/2020   Dementia without behavioral disturbance ([place]) 08/21/2020   Vision impairment, Right Eye 08/21/2020   Chronic kidney disease, stage 3a ([place]) 08/18/2020   Frequent urination 07/30/2020   Chronic rhinitis 12/18/2019   Chronic respiratory failure with hypoxia ([place]) 12/18/2019   Hyperlipidemia associated with type 2 diabetes mellitus ([place]) 03/05/2019   Falls frequently    Asthma 08/03/2017   Osteopenia 07/10/2015    Mild persistent asthma in adult without complication 45/80/9983   NICM (nonischemic cardiomyopathy) ([place]) 04/23/2014   Arthritis or polyarthritis, rheumatoid ([place]) 03/12/2014   ICD (implantable cardioverter-defibrillator), dual, in situ 03/06/2014   Heart Failure with Recovered Ejection Fraction, G2DD 05/31/2013   Parkinson disease ([place]) 05/21/2013   Vertigo 03/03/2013   Gastroesophageal reflux disease without esophagitis 09/27/2012   Dyspnea 09/19/2012   Trigger point with neck pain 04/12/2012   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   Diabetic peripheral neuropathy ([place]) 12/09/2011   Chronic urticaria 05/27/2011   Sinus tachycardia ([place]) 05/27/2011   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 05/20/2011   Type II diabetes mellitus with complication ([place]) 38/25/0539   Hypertension associated with diabetes ([place]) 09/28/2006    Past Surgical History:  Procedure Laterality Date   BREAST EXCISIONAL BIOPSY Left 1970   benign cyst   BREAST SURGERY Left    BUNIONECTOMY     CARDIAC CATHETERIZATION N/A 12/16/2015   Procedure: Right Heart Cath;  Surgeon: Sherren Mocha, MD;  Location: [facility];  Service: Cardiovascular;  Laterality: N/A;   CHOLECYSTECTOMY     eye lid surgery Bilateral 05/09/2019   IMPLANTABLE CARDIOVERTER DEFIBRILLATOR IMPLANT  11-25-13   MDT dual chamber ICD implanted by [name] for primary prevention   IMPLANTABLE CARDIOVERTER DEFIBRILLATOR IMPLANT N/A 11/25/2013   Procedure: IMPLANTABLE CARDIOVERTER DEFIBRILLATOR IMPLANT;  Surgeon: [name], MD;  Location[facility];  Service: Cardiovascular;  Laterality: N/A;   LEFT AND RIGHT HEART CATHETERIZATION WITH CORONARY ANGIOGRAM N/A 05/31/2013   Procedure: LEFT AND RIGHT HEART CATHETERIZATION WITH CORONARY ANGIOGRAM;  Surgeon: [name], MD;  Location: [facility] CATH LAB;  Service: Cardiovascular;  Laterality: N/A;   TONSILLECTOMY     TUBAL LIGATION      OB History   No obstetric history on file.      Home Medications     Prior to Admission medications   Medication Sig Start Date End Date Taking? Authorizing Provider  APPLE CIDER VINEGAR PO Take 1 capsule by mouth daily.   Yes [provider]  Biotin 5000 MCG CAPS Take 1 tablet by mouth daily.    Yes [provider]  budesonide-formoterol (SYMBICORT) 160-4.5 MCG/ACT inhaler Inhale 2 puffs into the lungs 2 (two) times daily. 10/24/19  Yes Lauraine Rinne, NP  carbidopa-levodopa (SINEMET IR) 25-100 MG tablet TAKE 1 TABLET BY MOUTH THREE TIMES DAILY 09/28/20  Yes Tat, Eustace Quail, DO  famotidine (PEPCID) 20 MG tablet TAKE 1 TABLET TWICE DAILY 06/17/19  Yes Tammi Klippel, Sherin, DO  fluconazole (DIFLUCAN) 150 MG tablet Take 1 tablet (150 mg total) by mouth every 3 (three) days. Take one tablet, if still symptoms of vaginal itching, take 2nd in 72 hours 01/06/21  Yes Beard, Samantha N, DO  fluticasone (FLONASE SENSIMIST) 27.5 MCG/SPRAY nasal spray Place 2 sprays into the nose daily. 12/17/20  Yes Parrett, Tammy S, NP  furosemide (LASIX) 40 MG tablet  Take 1 tablet (40 mg total) by mouth daily. 10/20/20  Yes Paige, [place] J, DO  levalbuterol (XOPENEX) 0.63 MG/3ML nebulizer solution USE 1 VIAL IN NEBULIZER EVERY 4 TO 6 HOURS AS NEEDED FOR WHEEZING AND FOR SHORTNESS OF BREATH 10/14/20  Yes Mannam, Praveen, MD  losartan (COZAAR) 25 MG tablet TAKE 1 TABLET EVERY DAY 04/01/20  Yes Sherren Mocha, MD  meclizine (ANTIVERT) 12.5 MG tablet Take 1 tablet (12.5 mg total) by mouth daily as needed for dizziness. Should not be long term. 10/02/20  Yes Beard, Samantha N, DO  melatonin 5 MG TABS Take 5 mg by mouth at bedtime.   Yes [provider]  metFORMIN (GLUCOPHAGE) 500 MG tablet Take 2 tablets (1,000 mg total) by mouth 2 (two) times daily with a meal. 06/24/20  Yes Darrelyn Hillock N, DO  metoprolol succinate (TOPROL-XL) 50 MG 24 hr tablet Take 1 tablet (50 mg total) by mouth 2 (two) times daily. Please make yearly appt with Dr. Lovena Le for July 2022 for future refills.  Thank you 1st attempt 01/14/21  Yes [name], MD  OXYGEN Inhale into the lungs at bedtime. Inhale 2L into lungs   Yes [provider]  potassium chloride (KLOR-CON) 10 MEQ tablet Take 10 mEq by mouth daily as needed (low potassium).   Yes [provider]  spironolactone (ALDACTONE) 25 MG tablet Take 0.5 tablets (12.5 mg total) by mouth daily. 10/20/20  Yes Paige, Victoria J, DO  Ubiquinone (ULTRA COQ10 PO) Take 1 tablet by mouth 3 (three) times a week.   Yes [provider]    Family History Family History  Problem Relation Age of Onset   Coronary artery disease Father        Died age 33   Heart attack Father    Diabetes Father    Coronary artery disease Mother        Died age 68   Heart attack Mother    Diabetes Mother    Parkinson's disease Sister    Heart disease Sister    Hepatitis C Sister    Diabetes Sister    Diabetes Son 25       T1DM   Healthy Daughter    Diabetes Sister    Heart disease Sister    Diabetes Sister    Heart disease Sister    Breast cancer Neg Hx     Social History Social History   Tobacco Use   Smoking status: Never   Smokeless tobacco: Never   Tobacco comments:    exposed to smoke during child hood (parents)  Vaping Use   Vaping Use: Never used  Substance Use Topics   Alcohol use: No    Alcohol/week: 0.0 standard drinks   Drug use: No     Allergies   Aspirin, Penicillins, Prempro [conj estrog-medroxyprogest ace], Ace inhibitors, and Simvastatin   Review of Systems Review of Systems  Gastrointestinal:  Positive for abdominal pain.  Genitourinary:  Positive for dysuria, frequency and hematuria.  Musculoskeletal:  Positive for arthralgias.  Skin:  Positive for color change.  Neurological:  Positive for headaches.  All other systems reviewed and are negative.   Physical Exam Triage Vital Signs ED Triage Vitals  Enc Vitals Group     BP 01/18/21 0857 (!) 129/45     Pulse Rate 01/18/21 0857 (!) 105      Resp --      Temp 01/18/21 0857 99.3 F (37.4 C)     Temp Source 01/18/21  0857 Oral     SpO2 01/18/21 0857 99 %     Weight --      Height --      Head Circumference --      Peak Flow --      Pain Score 01/18/21 0858 7     Pain Loc --      Pain Edu? --      Excl. in [place]? --    No data found.  Updated Vital Signs BP (!) 129/45 (BP Location: Right Arm)   Pulse (!) 105   Temp 99.3 F (37.4 C) (Oral)   LMP 08/01/2000 (Approximate)   SpO2 99%   Visual Acuity Right Eye Distance:   Left Eye Distance:   Bilateral Distance:    Right Eye Near:   Left Eye Near:    Bilateral Near:     Physical Exam Vitals and nursing note reviewed.  Constitutional:      General: She is not in acute distress.    Appearance: Normal appearance. She is not ill-appearing, toxic-appearing or diaphoretic.  HENT:     Head: Normocephalic and atraumatic.  Eyes:     Conjunctiva/sclera: Conjunctivae normal.  Cardiovascular:     Rate and Rhythm: Normal rate.     Pulses: Normal pulses.  Pulmonary:     Effort: Pulmonary effort is normal.  Abdominal:     General: Abdomen is flat.  Musculoskeletal:        General: Normal range of motion.     Cervical back: Normal range of motion.  Skin:    General: Skin is warm and dry.  Neurological:     General: No focal deficit present.     Mental Status: She is alert and oriented to person, place, and time.  Psychiatric:        Mood and Affect: Mood normal.     UC Treatments / Results  Labs (all labs ordered are listed, but only abnormal results are displayed) Labs Reviewed  POCT URINALYSIS DIPSTICK, ED / UC - Abnormal; Notable for the following components:      Result Value   Bilirubin Urine SMALL (*)    Ketones, ur TRACE (*)    Hgb urine dipstick LARGE (*)    Protein, ur >=300 (*)    Leukocytes,Ua LARGE (*)    All other components within normal limits    EKG   Radiology No results found.  Procedures Procedures (including critical care  time)  Medications Ordered in UC Medications - No data to display  Initial Impression / Assessment and Plan / UC Course  I have reviewed the triage vital signs and the nursing notes.  Pertinent labs & imaging results that were available during my care of the patient were reviewed by me and considered in my medical decision making (see chart for details).    Urinalysis with trace ketones, small bilirubin, large leukocytes and large hemoglobin, and protein.  Discussed treatment options with patient including antibiotics here and then following up with primary care and/or urology.  Patient states that she does not feel safe going home due to concern that she will have to be rushed back later today.  Therefore patient was instructed that she may go to the emergency room for further evaluation and work-up.  Patient will be transported to the ED via private vehicle with family member. Final Clinical Impressions(s) / UC Diagnoses   Final diagnoses:  Hematuria, unspecified type     Discharge Instructions  You may proceed to the Emergency Department for further evaluation of the blood in your urine, headache, and leg cramping.       ED Prescriptions   None    PDMP not reviewed this encounter.   Pearson Forster, NP 01/18/21 [phone]

## 2021-01-18 NOTE — ED Triage Notes (Signed)
Patient c/o possible UTI, lower abdominal cramp, dysuria since last night, hematuria this morning.

## 2021-01-18 NOTE — ED Triage Notes (Signed)
Patient went to the ED

## 2021-01-18 NOTE — Discharge Instructions (Signed)
You may proceed to the Emergency Department for further evaluation of the blood in your urine, headache, and leg cramping.

## 2021-01-18 NOTE — ED Triage Notes (Signed)
Pt sent here from ucc. Reports onset last night of painful urination and hematuria. Having lower abd pain and fever. Was here on 6/3 for similar but denied blood in urine at that time. No acute distress noted at triage.

## 2021-01-19 DIAGNOSIS — N3001 Acute cystitis with hematuria: Secondary | ICD-10-CM | POA: Diagnosis not present

## 2021-01-19 LAB — URINALYSIS, ROUTINE W REFLEX MICROSCOPIC
Bilirubin Urine: NEGATIVE
Glucose, UA: NEGATIVE mg/dL
Hgb urine dipstick: NEGATIVE
Ketones, ur: NEGATIVE mg/dL
Nitrite: NEGATIVE
Protein, ur: NEGATIVE mg/dL
Specific Gravity, Urine: 1.012 (ref 1.005–1.030)
pH: 6 (ref 5.0–8.0)

## 2021-01-19 MED ORDER — CIPROFLOXACIN HCL 500 MG PO TABS: 500.0000 mg | ORAL_TABLET | Freq: Two times a day (BID) | ORAL | 0 refills | Status: DC

## 2021-01-19 MED ORDER — CIPROFLOXACIN IN D5W 400 MG/200ML IV SOLN
400.0000 mg | Freq: Once | INTRAVENOUS | Status: AC
  Administered 2021-01-19: 400 mg via INTRAVENOUS
  Filled 2021-01-19: qty 200

## 2021-01-19 MED ORDER — SODIUM CHLORIDE 0.9 % IV SOLN: 1.0000 g | Freq: Once | INTRAVENOUS | Status: DC

## 2021-01-19 NOTE — ED Provider Notes (Signed)
Patient seen after prior ED provider.  She feels improved.  She understands need for close follow-up.  She understands outpatient plan of care.  Importance of close follow-up is repeatedly stressed.  Strict return precautions given and understood.   Valarie Merino, MD 01/19/21 [phone]

## 2021-01-19 NOTE — ED Provider Notes (Signed)
[facility] EMERGENCY DEPARTMENT Provider Note   CSN: [id number] Arrival date & time: 01/18/21  7672     History Chief Complaint  Patient presents with   Abdominal Pain   Fever    Nattalie Suhana Wilner is a 70 y.o. female.  HPI     This is a 70 year old female with a history of CHF, diabetes, hypertension, hyperlipidemia who presents with dysuria and hematuria.  Patient reports that over 24 hours ago but she began to have burning with urination and noted a significant amount of blood in her urine.  She reports she has had some suprapubic abdominal tenderness.  It is mostly painful when she urinates.  She is not had any flank pain or fevers.  She was seen and evaluated in urgent care yesterday.  Urine dipstick was positive.  She was given the option for further evaluation versus discharge with antibiotics.  She requested further evaluation given that she recently was treated with antibiotics.  I have reviewed her chart.  Her last urine culture grew out less than 10,000 species.  She finished a full course of Keflex.  Past Medical History:  Diagnosis Date   Acute on chronic systolic CHF (congestive heart failure), NYHA class 4 (HCC) 10/19/2020   Asthma    Back pain 09/18/2019   Cervical disc disorder with radiculopathy of cervical region 0/94/7096   Chronic systolic CHF (congestive heart failure) ([place])    a. cMRI 4/15: EF 34% and findings - c/w NICM, normal RV size and function (RVEF 61%), Mild MR // b. Echo 2/15:  EF 30-35%, diff HK, ant-sept AK, Gr 2 DD, mild MR, trivial TR  //  c. Echo 5/17: EF 20-25%, severe diffuse HK, marked systolic dyssynchrony, grade 1 diastolic dysfunction, mild MR  //  d. [place] 5/17: Fick CO 2.9, RVSP 19, PASP 15, PW mean 2, low filing pressures and preserved CO    Cognitive impairment 10/19/2017   MOCA was administered with a score of 23/30   Diabetes mellitus    Diabetic peripheral neuropathy ([place]) 12/09/2011   Flu 10/17/2017   Gastritis     History of echocardiogram    Echo 6/18: EF 30-35, diffuse HK, grade 1 diastolic dysfunction, trivial MR, mild LAE, mild TR, no pericardial effusion   History of nuclear stress test    Myoview 5/18: EF 49, no ischemia, inferoseptal defect c/w LBBB artifact (intermediate risk due to EF < 50).   HTN (hypertension)    Hyperlipidemia    Hyperlipidemia associated with type 2 diabetes mellitus ([place]) 03/05/2019   Hypertension associated with diabetes ([place]) 09/28/2006   Qualifier: Diagnosis of  By: [name]     Major depression 03/31/2013   Melena 08/21/2020   NICM (nonischemic cardiomyopathy) ([place])    a. Nuclear 5/13: Normal stress nuclear study. LV Ejection Fraction: 58%  //  b. LHC 10/14: Minor luminal irregularity in prox LAD, EF 35%    Parkinson disease (HCC)    Plantar fasciitis    Plantar fasciitis, bilateral 06/15/2012   Rosacea 05/29/2009   Qualifier: Diagnosis of  By: [name] MD, [name] hearing loss (SNHL), bilateral 01/04/2017   Sleep apnea    was retested and no longer had it and so d/c CPAP   Syncope    Thoracic or lumbosacral neuritis or radiculitis, unspecified 07/03/2013   Tinnitus, bilateral 01/04/2017   Type II diabetes mellitus with complication ([place]) 2/83/6629   Diabetic eye exam done by [facility]: Dr. Prudencio Burly  on 11/08/13: no diabetic retinopathy. Repeat in 1 year     Urticaria     Patient Active Problem List   Diagnosis Date Noted   Acute on chronic systolic CHF (congestive heart failure), NYHA class 4 ([place]) 10/19/2020   Chronic HFrEF (heart failure with reduced ejection fraction) ([place])    Cervical dystonia 10/07/2020   Advanced care planning/counseling discussion 08/21/2020   Dementia without behavioral disturbance ([place]) 08/21/2020   Vision impairment, Right Eye 08/21/2020   Chronic kidney disease, stage 3a ([place]) 08/18/2020   Frequent urination 07/30/2020   Chronic rhinitis 12/18/2019   Chronic respiratory failure with hypoxia ([place])  12/18/2019   Hyperlipidemia associated with type 2 diabetes mellitus ([place]) 03/05/2019   Falls frequently    Asthma 08/03/2017   Osteopenia 07/10/2015   Mild persistent asthma in adult without complication 83/41/9622   NICM (nonischemic cardiomyopathy) ([place]) 04/23/2014   Arthritis or polyarthritis, rheumatoid ([place]) 03/12/2014   ICD (implantable cardioverter-defibrillator), dual, in situ 03/06/2014   Heart Failure with Recovered Ejection Fraction, G2DD 05/31/2013   Parkinson disease ([place]) 05/21/2013   Vertigo 03/03/2013   Gastroesophageal reflux disease without esophagitis 09/27/2012   Dyspnea 09/19/2012   Trigger point with neck pain 04/12/2012   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   Diabetic peripheral neuropathy ([place]) 12/09/2011   Chronic urticaria 05/27/2011   Sinus tachycardia ([place]) 05/27/2011   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 05/20/2011   Type II diabetes mellitus with complication ([place]) 29/79/8921   Hypertension associated with diabetes ([place]) 09/28/2006    Past Surgical History:  Procedure Laterality Date   BREAST EXCISIONAL BIOPSY Left 1970   benign cyst   BREAST SURGERY Left    BUNIONECTOMY     CARDIAC CATHETERIZATION N/A 12/16/2015   Procedure: Right Heart Cath;  Surgeon: Sherren Mocha, MD;  Location: [facility];  Service: Cardiovascular;  Laterality: N/A;   CHOLECYSTECTOMY     eye lid surgery Bilateral 05/09/2019   IMPLANTABLE CARDIOVERTER DEFIBRILLATOR IMPLANT  11-25-13   MDT dual chamber ICD implanted by [name] for primary prevention   IMPLANTABLE CARDIOVERTER DEFIBRILLATOR IMPLANT N/A 11/25/2013   Procedure: IMPLANTABLE CARDIOVERTER DEFIBRILLATOR IMPLANT;  Surgeon: [name], MD;  Location: [facility] C[facility] LAB;  Service: Cardiovascular;  Laterality: N/A;   LEFT AND RIGHT HEART CATHETERIZATION WITH CORONARY ANGIOGRAM N/A 05/31/2013   Procedure: LEFT AND RIGHT HEART CATHETERIZATION WITH CORONARY ANGIOGRAM;  Surgeon: [name], MD;  Location: [facility] CATH LAB;   Service: Cardiovascular;  Laterality: N/A;   TONSILLECTOMY     TUBAL LIGATION       OB History   No obstetric history on file.     Family History  Problem Relation Age of Onset   Coronary artery disease Father        Died age 30   Heart attack Father    Diabetes Father    Coronary artery disease Mother        Died age 76   Heart attack Mother    Diabetes Mother    Parkinson's disease Sister    Heart disease Sister    Hepatitis C Sister    Diabetes Sister    Diabetes Son 2       T1DM   Healthy Daughter    Diabetes Sister    Heart disease Sister    Diabetes Sister    Heart disease Sister    Breast cancer Neg Hx     Social History   Tobacco Use   Smoking status: Never   Smokeless  tobacco: Never   Tobacco comments:    exposed to smoke during child hood (parents)  Vaping Use   Vaping Use: Never used  Substance Use Topics   Alcohol use: No    Alcohol/week: 0.0 standard drinks   Drug use: No    Home Medications Prior to Admission medications   Medication Sig Start Date End Date Taking? Authorizing Provider  APPLE CIDER VINEGAR PO Take 1 capsule by mouth daily.    [provider]  Biotin 5000 MCG CAPS Take 1 tablet by mouth daily.     [provider]  budesonide-formoterol (SYMBICORT) 160-4.5 MCG/ACT inhaler Inhale 2 puffs into the lungs 2 (two) times daily. 10/24/19   Lauraine Rinne, NP  carbidopa-levodopa (SINEMET IR) 25-100 MG tablet TAKE 1 TABLET BY MOUTH THREE TIMES DAILY 09/28/20   Tat, Eustace Quail, DO  famotidine (PEPCID) 20 MG tablet TAKE 1 TABLET TWICE DAILY 06/17/19   Caroline More, DO  fluconazole (DIFLUCAN) 150 MG tablet Take 1 tablet (150 mg total) by mouth every 3 (three) days. Take one tablet, if still symptoms of vaginal itching, take 2nd in 72 hours 01/06/21   [name], DO  fluticasone (FLONASE SENSIMIST) 27.5 MCG/SPRAY nasal spray Place 2 sprays into the nose daily. 12/17/20   Parrett, Fonnie Mu, NP  furosemide (LASIX) 40 MG  tablet Take 1 tablet (40 mg total) by mouth daily. 10/20/20   Shary Key, DO  levalbuterol (XOPENEX) 0.63 MG/3ML nebulizer solution USE 1 VIAL IN NEBULIZER EVERY 4 TO 6 HOURS AS NEEDED FOR WHEEZING AND FOR SHORTNESS OF BREATH 10/14/20   Marshell Garfinkel, MD  losartan (COZAAR) 25 MG tablet TAKE 1 TABLET EVERY DAY 04/01/20   Sherren Mocha, MD  meclizine (ANTIVERT) 12.5 MG tablet Take 1 tablet (12.5 mg total) by mouth daily as needed for dizziness. Should not be long term. 10/02/20   [name], DO  melatonin 5 MG TABS Take 5 mg by mouth at bedtime.    [provider]  metFORMIN (GLUCOPHAGE) 500 MG tablet Take 2 tablets (1,000 mg total) by mouth 2 (two) times daily with a meal. 06/24/20   [name], DO  metoprolol succinate (TOPROL-XL) 50 MG 24 hr tablet Take 1 tablet (50 mg total) by mouth 2 (two) times daily. Please make yearly appt with Dr. Lovena Le for July 2022 for future refills. Thank you 1st attempt 01/14/21   [name], MD  OXYGEN Inhale into the lungs at bedtime. Inhale 2L into lungs    [provider]  potassium chloride (KLOR-CON) 10 MEQ tablet Take 10 mEq by mouth daily as needed (low potassium).    [provider]  spironolactone (ALDACTONE) 25 MG tablet Take 0.5 tablets (12.5 mg total) by mouth daily. 10/20/20   Shary Key, DO  Ubiquinone (ULTRA COQ10 PO) Take 1 tablet by mouth 3 (three) times a week.    [provider]    Allergies    Aspirin, Penicillins, Prempro [conj estrog-medroxyprogest ace], Ace inhibitors, and Simvastatin  Review of Systems   Review of Systems  Constitutional:  Negative for fever.  Respiratory:  Negative for shortness of breath.   Cardiovascular:  Negative for chest pain.  Gastrointestinal:  Positive for abdominal pain.  Genitourinary:  Positive for dysuria and hematuria. Negative for flank pain.  All other systems reviewed and are negative.  Physical Exam Updated Vital Signs BP (!) 112/53  (BP Location: Right Arm)   Pulse 94   Temp 98.2 F (  36.8 C) (Oral)   Resp (!) 23   LMP 08/01/2000 (Approximate)   SpO2 96%   Physical Exam Vitals and nursing note reviewed.  Constitutional:      Appearance: She is well-developed.  HENT:     Head: Normocephalic and atraumatic.  Eyes:     Pupils: Pupils are equal, round, and reactive to light.  Cardiovascular:     Rate and Rhythm: Normal rate and regular rhythm.     Heart sounds: Normal heart sounds.  Pulmonary:     Effort: Pulmonary effort is normal. No respiratory distress.     Breath sounds: No wheezing.  Abdominal:     General: Bowel sounds are normal.     Palpations: Abdomen is soft.     Tenderness: There is no abdominal tenderness. There is no right CVA tenderness or left CVA tenderness.  Musculoskeletal:     Cervical back: Neck supple.  Skin:    General: Skin is warm and dry.  Neurological:     General: No focal deficit present.     Mental Status: She is alert and oriented to person, place, and time.  Psychiatric:        Mood and Affect: Mood normal.    ED Results / Procedures / Treatments   Labs (all labs ordered are listed, but only abnormal results are displayed) Labs Reviewed  CBC WITH DIFFERENTIAL/PLATELET - Abnormal; Notable for the following components:      Result Value   WBC 10.8 (*)    Neutro Abs 8.3 (*)    All other components within normal limits  COMPREHENSIVE METABOLIC PANEL - Abnormal; Notable for the following components:   Glucose, Bld 175 (*)    Creatinine, Ser 1.39 (*)    GFR, Estimated 41 (*)    All other components within normal limits  URINE CULTURE  LIPASE, BLOOD  URINALYSIS, ROUTINE W REFLEX MICROSCOPIC    EKG None  Radiology CT Abdomen Pelvis W Contrast  Result Date: 01/18/2021 CLINICAL DATA:  70 year old female with right lower quadrant abdominal pain. EXAM: CT ABDOMEN AND PELVIS WITH CONTRAST TECHNIQUE: Multidetector CT imaging of the abdomen and pelvis was performed using  the standard protocol following bolus administration of intravenous contrast. CONTRAST:  15mL OMNIPAQUE IOHEXOL 300 MG/ML  SOLN COMPARISON:  CT abdomen pelvis dated 01/01/2021. FINDINGS: Lower chest: The visualized lung bases are clear. Punctate calcific granuloma at the left lung base. Cardiac pacemaker wires noted. No intra-abdominal free air or free fluid. Hepatobiliary: The liver is unremarkable. No intrahepatic biliary dilatation. Cholecystectomy. No retained calcified stone noted in the central CBD. Pancreas: Unremarkable. No pancreatic ductal dilatation or surrounding inflammatory changes. Spleen: Normal in size without focal abnormality. Adrenals/Urinary Tract: The adrenal glands unremarkable. There is mild left renal parenchyma atrophy. There is no hydronephrosis on either side. There is symmetric enhancement and excretion of contrast by both kidneys. The visualized ureters appear unremarkable. There is mild haziness of the perivesical fat. Correlation with urinalysis recommended to exclude UTI. Stomach/Bowel: There is a small hiatal hernia. There is sigmoid diverticulosis without active inflammatory changes. There is no bowel obstruction or active inflammation. The appendix is normal. Vascular/Lymphatic: Mild atherosclerotic calcification of the abdominal aorta. The IVC is unremarkable. No portal venous gas. There is no adenopathy. Reproductive: The uterus is anteverted and grossly unremarkable. The ovaries are unremarkable. Vascular clips noted in the region of the left adnexa. Other: Diffuse subcutaneous edema and stranding of the gluteal region similar to prior CT, possibly related to prior injections. No  fluid collection. Musculoskeletal: Degenerative changes of the spine. No acute osseous pathology. IMPRESSION: 1. Mild haziness of the perivesical fat. Correlation with urinalysis recommended to exclude UTI. 2. Sigmoid diverticulosis. No bowel obstruction. Normal appendix. 3. Aortic Atherosclerosis  (ICD10-I70.0). Electronically Signed   By: Anner Crete M.D.   On: 01/18/2021 18:25    Procedures Procedures   Medications Ordered in ED Medications  ciprofloxacin (CIPRO) IVPB 400 mg (has no administration in time range)  iohexol (OMNIPAQUE) 300 MG/ML solution 80 mL (80 mLs Intravenous Contrast Given 01/18/21 1755)    ED Course  I have reviewed the triage vital signs and the nursing notes.  Pertinent labs & imaging results that were available during my care of the patient were reviewed by me and considered in my medical decision making (see chart for details).    MDM Rules/Calculators/A&P                          Patient presents with urinary symptoms.  She is overall nontoxic and vital signs are reassuring.  She was seen evaluated urgent care.  She did have a positive urine dipstick.  I reviewed her recent urine cultures.  1 was clean and the other had less than 10,000 colonies.  She did finish a course of Keflex.  She has no CVA tenderness.  She has no lower abdominal tenderness.  Work-up reviewed from triage.  She does have a leukocytosis to 10.8.  Her creatinine has improved from prior.  She has not yet provided a urine sample here.  CT scan was ordered from triage to exclude pyelonephritis or infected kidney stone.  CT scan shows some haziness around the perivesicular fat suggestive of possible UTI.  No kidney stone or perinephric sprain ending.  Requested repeat urinalysis and urine culture.  She was treated with cephalosporin previously.  Will transition to fluoroquinolone given concern for resistance.  Anticipate she can be discharged as she is not septic appearing.  Final Clinical Impression(s) / ED Diagnoses Final diagnoses:  Acute cystitis with hematuria    Rx / DC Orders ED Discharge Orders     None        Merryl Hacker, MD 01/19/21 [phone]

## 2021-01-19 NOTE — Discharge Instructions (Addendum)
Return for any problem.  ?

## 2021-01-20 LAB — URINE CULTURE: Culture: 60000 — AB

## 2021-01-22 ENCOUNTER — Other Ambulatory Visit: Payer: Self-pay

## 2021-01-22 ENCOUNTER — Ambulatory Visit (INDEPENDENT_AMBULATORY_CARE_PROVIDER_SITE_OTHER): Payer: Medicare Other | Admitting: Student

## 2021-01-22 ENCOUNTER — Encounter: Payer: Self-pay | Admitting: Family Medicine

## 2021-01-22 ENCOUNTER — Ambulatory Visit (INDEPENDENT_AMBULATORY_CARE_PROVIDER_SITE_OTHER): Payer: Medicare Other | Admitting: Family Medicine

## 2021-01-22 ENCOUNTER — Encounter: Payer: Self-pay | Admitting: Student

## 2021-01-22 VITALS — BP 117/51 | HR 85 | Wt 136.6 lb

## 2021-01-22 VITALS — BP 110/64 | HR 73 | Ht 61.5 in | Wt 135.0 lb

## 2021-01-22 DIAGNOSIS — N1831 Chronic kidney disease, stage 3a: Secondary | ICD-10-CM | POA: Diagnosis not present

## 2021-01-22 DIAGNOSIS — Z95810 Presence of automatic (implantable) cardiac defibrillator: Secondary | ICD-10-CM

## 2021-01-22 DIAGNOSIS — E1169 Type 2 diabetes mellitus with other specified complication: Secondary | ICD-10-CM

## 2021-01-22 DIAGNOSIS — N3001 Acute cystitis with hematuria: Secondary | ICD-10-CM

## 2021-01-22 DIAGNOSIS — I10 Essential (primary) hypertension: Secondary | ICD-10-CM

## 2021-01-22 DIAGNOSIS — E118 Type 2 diabetes mellitus with unspecified complications: Secondary | ICD-10-CM | POA: Diagnosis not present

## 2021-01-22 DIAGNOSIS — I5022 Chronic systolic (congestive) heart failure: Secondary | ICD-10-CM

## 2021-01-22 DIAGNOSIS — E785 Hyperlipidemia, unspecified: Secondary | ICD-10-CM | POA: Diagnosis not present

## 2021-01-22 LAB — CUP PACEART INCLINIC DEVICE CHECK
Battery Remaining Longevity: 32 mo
Battery Voltage: 2.94 V
Brady Statistic AP VP Percent: 0 %
Brady Statistic AP VS Percent: 0.02 %
Brady Statistic AS VP Percent: 0.03 %
Brady Statistic AS VS Percent: 99.95 %
Brady Statistic RA Percent Paced: 0.02 %
Brady Statistic RV Percent Paced: 0.03 %
Date Time Interrogation Session: 20220624104702
HighPow Impedance: 75 Ohm
Implantable Lead Implant Date: 20150427
Implantable Lead Implant Date: 20150427
Implantable Lead Location: 753859
Implantable Lead Location: 753860
Implantable Lead Model: 5076
Implantable Lead Model: 6935
Implantable Pulse Generator Implant Date: 20150427
Lead Channel Impedance Value: 399 Ohm
Lead Channel Impedance Value: 4047 Ohm
Lead Channel Impedance Value: 4047 Ohm
Lead Channel Impedance Value: 4047 Ohm
Lead Channel Impedance Value: 589 Ohm
Lead Channel Impedance Value: 646 Ohm
Lead Channel Pacing Threshold Amplitude: 0.5 V
Lead Channel Pacing Threshold Amplitude: 0.625 V
Lead Channel Pacing Threshold Pulse Width: 0.4 ms
Lead Channel Pacing Threshold Pulse Width: 0.4 ms
Lead Channel Sensing Intrinsic Amplitude: 10.125 mV
Lead Channel Sensing Intrinsic Amplitude: 12.75 mV
Lead Channel Sensing Intrinsic Amplitude: 2 mV
Lead Channel Sensing Intrinsic Amplitude: 2.625 mV
Lead Channel Setting Pacing Amplitude: 2 V
Lead Channel Setting Pacing Amplitude: 2.5 V
Lead Channel Setting Pacing Pulse Width: 0.4 ms
Lead Channel Setting Sensing Sensitivity: 0.3 mV

## 2021-01-22 LAB — POCT URINALYSIS DIP (MANUAL ENTRY)
Bilirubin, UA: NEGATIVE
Blood, UA: NEGATIVE
Glucose, UA: NEGATIVE mg/dL
Ketones, POC UA: NEGATIVE mg/dL
Nitrite, UA: NEGATIVE
Protein Ur, POC: NEGATIVE mg/dL
Spec Grav, UA: >=1.03 — AB (ref 1.010–1.025)
Urobilinogen, UA: 0.2 E.U./dL
pH, UA: 5.5 (ref 5.0–8.0)

## 2021-01-22 LAB — POCT UA - MICROSCOPIC ONLY

## 2021-01-22 LAB — POCT GLYCOSYLATED HEMOGLOBIN (HGB A1C): HbA1c, POC (controlled diabetic range): 7.6 % — AB (ref 0.0–7.0)

## 2021-01-22 MED ORDER — ROSUVASTATIN CALCIUM 10 MG PO TABS: 10.0000 mg | ORAL_TABLET | ORAL | 0 refills | Status: DC

## 2021-01-22 NOTE — Progress Notes (Signed)
    SUBJECTIVE:   CHIEF COMPLAINT / HPI: "F/u UTI"   Veronica Shaw is a 70 year old female presenting for follow-up recent UTI.  She was recently seen on 6/20-6/21 due to dysuria and lower abdominal pain.  CT abdomen was collected to rule out obstructing renal stone/appendicitis, scan fortunately without acute intra abdominal abnormalities.  She was sent home with ciprofloxacin with concern for possible resistance as she was recently treated for UTI several weeks ago.  Today, she reports that she is feeling much better.  No remaining abdominal pain or dysuria.  Still has some antibiotics left to complete.  Urine culture grew about 60,000 colonies with multiple species.  She has a history of functional incontinence, sometimes cannot make it to the bathroom in time and does wear pads on a regular basis.  Changes frequently so that they are not wet.  Prior to these 2 episodes she had not had a UTI in >6 months.    PERTINENT  PMH / PSH: Hypertension, nonischemic cardiomyopathy, Parkinson's disease, asthma, type 2 diabetes, hyperlipidemia, dementia, ICD in place  OBJECTIVE:   BP (!) 117/51   Pulse 85   Wt 136 lb 9.6 oz (62 kg)   LMP 08/01/2000 (Approximate)   SpO2 98%   BMI 24.59 kg/m   General: Alert, NAD HEENT: NCAT, MMM Lungs: No increased WOB  Abdomen: soft, non-tender in all 4 quadrants, no masses palpated, non-distended, normoactive BS Ext: Warm, dry, 2+ distal pulses   ASSESSMENT/PLAN:   UTI (urinary tract infection) Fortunately symptoms resolved.  CT abdomen without contributing renal stone or evidence of pyelonephritis.  While culture only grew 60,000 colonies, given classic symptoms and resolution with antibiotics, encouraged to complete course.  May use Azo cranberry to assist in prevention, encouraged general GU hygiene.  Type II diabetes mellitus with complication (HCC) Y6T 0.3% today. Goal 7-8%.  Recommended resume physical activity as tolerated and balancing her diet.  Continue  metformin as is.  Considered SGLT2, however given recurrent GU symptoms will hold off for now, consider when consistently improved.  Hyperlipidemia associated with type 2 diabetes mellitus ([place]) Recheck lipid panel today.  Patient has been off of a statin due to myalgias for several years, however in discussion of aortic atherosclerosis present on her CT, she is willing to retry.  Rx'd Crestor 10 mg to start twice weekly.  Chronic kidney disease, stage 3a ([place]) Recent AKI when checked in the ED, had already been improving on 6/20.  Recheck BMP today per patient preference.    Follow up in 1 month with new PCP or sooner if needed.   Veronica Shaw, [place]

## 2021-01-22 NOTE — Patient Instructions (Signed)
Wonderful to see you!   Finish antibiotics and then monitor symptoms.   We will start a "statin" to help prevent plaques on your arteries. We will try twice weekly first.

## 2021-01-22 NOTE — Progress Notes (Signed)
Electrophysiology Office Note Date: 01/22/2021  ID:  Veronica Shaw, DOB Oct 28, 1950, MRN [id number]  PCP: [name], DO Primary Cardiologist: Sherren Mocha, MD Electrophysiologist: Cristopher Peru, MD   CC: Routine ICD follow-up  Veronica Shaw is a 70 y.o. female seen today for Cristopher Peru, MD for routine electrophysiology followup.  Since last being seen in our clinic the patient reports doing overall well. She was seen in ED earlier this week for a UTI and remains on ABx. She has chronic intermittent lightheadedness.  she denies chest pain, palpitations, dyspnea, PND, orthopnea, nausea, vomiting, syncope, edema, weight gain, or early satiety. She has not had ICD shocks.   Device History: Medtronic Dual Chamber ICD implanted 10/2013 for CHF   Past Medical History:  Diagnosis Date   Acute on chronic systolic CHF (congestive heart failure), NYHA class 4 ([place]) 10/19/2020   Asthma    Back pain 09/18/2019   Cervical disc disorder with radiculopathy of cervical region 2/83/1517   Chronic systolic CHF (congestive heart failure) ([place])    a. cMRI 4/15: EF 34% and findings - c/w NICM, normal RV size and function (RVEF 61%), Mild MR // b. Echo 2/15:  EF 30-35%, diff HK, ant-sept AK, Gr 2 DD, mild MR, trivial TR  //  c. Echo 5/17: EF 20-25%, severe diffuse HK, marked systolic dyssynchrony, grade 1 diastolic dysfunction, mild MR  //  d. [place] 5/17: Fick CO 2.9, RVSP 19, PASP 15, PW mean 2, low filing pressures and preserved CO    Cognitive impairment 10/19/2017   MOCA was administered with a score of 23/30   Diabetes mellitus    Diabetic peripheral neuropathy ([place]) 12/09/2011   Flu 10/17/2017   Gastritis    History of echocardiogram    Echo 6/18: EF 30-35, diffuse HK, grade 1 diastolic dysfunction, trivial MR, mild LAE, mild TR, no pericardial effusion   History of nuclear stress test    Myoview 5/18: EF 49, no ischemia, inferoseptal defect c/w LBBB artifact (intermediate risk due to  EF < 50).   HTN (hypertension)    Hyperlipidemia    Hyperlipidemia associated with type 2 diabetes mellitus ([place]) 03/05/2019   Hypertension associated with diabetes ([place]) 09/28/2006   Qualifier: Diagnosis of  By: [name]     Major depression 03/31/2013   Melena 08/21/2020   NICM (nonischemic cardiomyopathy) ([place])    a. Nuclear 5/13: Normal stress nuclear study. LV Ejection Fraction: 58%  //  b. LHC 10/14: Minor luminal irregularity in prox LAD, EF 35%    Parkinson disease (HCC)    Plantar fasciitis    Plantar fasciitis, bilateral 06/15/2012   Rosacea 05/29/2009   Qualifier: Diagnosis of  By: [name] MD, [name] hearing loss (SNHL), bilateral 01/04/2017   Sleep apnea    was retested and no longer had it and so d/c CPAP   Syncope    Thoracic or lumbosacral neuritis or radiculitis, unspecified 07/03/2013   Tinnitus, bilateral 01/04/2017   Type II diabetes mellitus with complication ([place]) 01/15/736   Diabetic eye exam done by [facility] Ophthalmology: Dr. Prudencio Burly on 11/08/13: no diabetic retinopathy. Repeat in 1 year     Urticaria    Past Surgical History:  Procedure Laterality Date   BREAST EXCISIONAL BIOPSY Left 1970   benign cyst   BREAST SURGERY Left    BUNIONECTOMY     CARDIAC CATHETERIZATION N/A 12/16/2015   Procedure: Right Heart Cath;  Surgeon: Sherren Mocha, MD;  Location:  [facility] IN[facility]ASIVE CV LAB;  Service: Cardiovascular;  Laterality: N/A;   CHOLECYSTECTOMY     eye lid surgery Bilateral 05/09/2019   IMPLANTABLE CARDIOVERTER DEFIBRILLATOR IMPLANT  11-25-13   MDT dual chamber ICD implanted by [name] for primary prevention   IMPLANTABLE CARDIOVERTER DEFIBRILLATOR IMPLANT N/A 11/25/2013   Procedure: IMPLANTABLE CARDIOVERTER DEFIBRILLATOR IMPLANT;  Surgeon: [name], MD;  Location: [facility] CATH LAB;  Service: Cardiovascular;  Laterality: N/A;   LEFT AND RIGHT HEART CATHETERIZATION WITH CORONARY ANGIOGRAM N/A 05/31/2013   Procedure: LEFT AND RIGHT HEART  CATHETERIZATION WITH CORONARY ANGIOGRAM;  Surgeon: [name], MD;  Location: [facility] CATH LAB;  Service: Cardiovascular;  Laterality: N/A;   TONSILLECTOMY     TUBAL LIGATION      Current Outpatient Medications  Medication Sig Dispense Refill   APPLE CIDER VINEGAR PO Take 1 capsule by mouth daily.     Biotin 5000 MCG CAPS Take 1 tablet by mouth daily.      budesonide-formoterol (SYMBICORT) 160-4.5 MCG/ACT inhaler Inhale 2 puffs into the lungs 2 (two) times daily. 1 Inhaler 0   carbidopa-levodopa (SINEMET IR) 25-100 MG tablet TAKE 1 TABLET BY MOUTH THREE TIMES DAILY 270 tablet 0   ciprofloxacin (CIPRO) 500 MG tablet Take 1 tablet (500 mg total) by mouth every 12 (twelve) hours. 10 tablet 0   famotidine (PEPCID) 20 MG tablet TAKE 1 TABLET TWICE DAILY 180 tablet 2   fluconazole (DIFLUCAN) 150 MG tablet Take 1 tablet (150 mg total) by mouth every 3 (three) days. Take one tablet, if still symptoms of vaginal itching, take 2nd in 72 hours 2 tablet 0   fluticasone (FLONASE SENSIMIST) 27.5 MCG/SPRAY nasal spray Place 2 sprays into the nose daily. 10 g 12   furosemide (LASIX) 40 MG tablet Take 1 tablet (40 mg total) by mouth daily. 30 tablet 0   levalbuterol (XOPENEX) 0.63 MG/3ML nebulizer solution USE 1 VIAL IN NEBULIZER EVERY 4 TO 6 HOURS AS NEEDED FOR WHEEZING AND FOR SHORTNESS OF BREATH 144 mL 11   losartan (COZAAR) 25 MG tablet TAKE 1 TABLET EVERY DAY 90 tablet 3   meclizine (ANTIVERT) 12.5 MG tablet Take 1 tablet (12.5 mg total) by mouth daily as needed for dizziness. Should not be long term. 10 tablet 0   melatonin 5 MG TABS Take 5 mg by mouth at bedtime.     metFORMIN (GLUCOPHAGE) 500 MG tablet Take 2 tablets (1,000 mg total) by mouth 2 (two) times daily with a meal. 180 tablet 3   metoprolol succinate (TOPROL-XL) 50 MG 24 hr tablet Take 1 tablet (50 mg total) by mouth 2 (two) times daily. Please make yearly appt with Dr. Lovena Le for July 2022 for future refills. Thank you 1st attempt 180 tablet  0   OXYGEN Inhale into the lungs at bedtime. Inhale 2L into lungs     potassium chloride (KLOR-CON) 10 MEQ tablet Take 10 mEq by mouth daily as needed (low potassium).     [START ON 01/25/2021] rosuvastatin (CRESTOR) 10 MG tablet Take 1 tablet (10 mg total) by mouth 2 (two) times a week. 30 tablet 0   spironolactone (ALDACTONE) 25 MG tablet Take 0.5 tablets (12.5 mg total) by mouth daily. 15 tablet 0   Ubiquinone (ULTRA COQ10 PO) Take 1 tablet by mouth 3 (three) times a week.     No current facility-administered medications for this visit.    Allergies:   Aspirin, Penicillins, Prempro [conj estrog-medroxyprogest ace], Ace inhibitors, and Simvastatin   Social History:  Social History   Socioeconomic History   Marital status: Married    Spouse name: [name]    Number of children: 2   Years of education: 12   Highest education level: 12th grade  Occupational History   Occupation: retired    Comment: CNA  Tobacco Use   Smoking status: Never   Smokeless tobacco: Never   Tobacco comments:    exposed to smoke during child hood (parents)  Vaping Use   Vaping Use: Never used  Substance and Sexual Activity   Alcohol use: No    Alcohol/week: 0.0 standard drinks   Drug use: No   Sexual activity: Yes    Birth control/protection: Post-menopausal  Other Topics Concern   Not on file  Social History Narrative   Patient lives with her husband [name].   Patient enjoys spending time with her family, cooking, and her 2 dogs.    Patient has one daughter who lives locally, and one son who lives in [place].   Social Determinants of Health   Financial Resource Strain: Low Risk    Difficulty of Paying Living Expenses: Not hard at all  Food Insecurity: No Food Insecurity   Worried About Charity fundraiser in the Last Year: Never true   [place] in the Last Year: Never true  Transportation Needs: No Transportation Needs   Lack of Transportation (Medical): No   Lack of Transportation  (Non-Medical): No  Physical Activity: Sufficiently Active   Days of Exercise per Week: 7 days   Minutes of Exercise per Session: 30 min  Stress: No Stress Concern Present   Feeling of Stress : Not at all  Social Connections: Moderately Integrated   Frequency of Communication with Friends and Family: More than three times a week   Frequency of Social Gatherings with Friends and Family: More than three times a week   Attends Religious Services: More than 4 times per year   Active Member of [employer] or Organizations: No   Attends Music therapist: Never   Marital Status: Married  Human resources officer Violence: Not At Risk   Fear of Current or Ex-Partner: No   Emotionally Abused: No   Physically Abused: No   Sexually Abused: No    Family History: Family History  Problem Relation Age of Onset   Coronary artery disease Father        Died age 71   Heart attack Father    Diabetes Father    Coronary artery disease Mother        Died age 59   Heart attack Mother    Diabetes Mother    Parkinson's disease Sister    Heart disease Sister    Hepatitis C Sister    Diabetes Sister    Diabetes Son 38       T1DM   Healthy Daughter    Diabetes Sister    Heart disease Sister    Diabetes Sister    Heart disease Sister    Breast cancer Neg Hx     Review of Systems: All other systems reviewed and are otherwise negative except as noted above.   Physical Exam: Vitals:   01/22/21 1026  BP: 110/64  Pulse: 73  SpO2: 95%  Weight: 135 lb (61.2 kg)  Height: 5' 1.5" (1.562 m)     GEN- The patient is well appearing, alert and oriented x 3 today.   HEENT: normocephalic, atraumatic; sclera clear, conjunctiva pink; hearing intact; oropharynx clear; neck supple,  no JVP Lymph- no cervical lymphadenopathy Lungs- Clear to ausculation bilaterally, normal work of breathing.  No wheezes, rales, rhonchi Heart- Regular rate and rhythm, no murmurs, rubs or gallops, PMI not laterally  displaced GI- soft, non-tender, non-distended, bowel sounds present, no hepatosplenomegaly Extremities- no clubbing or cyanosis. No edema; DP/PT/radial pulses 2+ bilaterally MS- no significant deformity or atrophy Skin- warm and dry, no rash or lesion; ICD pocket well healed Psych- euthymic mood, full affect Neuro- strength and sensation are intact  ICD interrogation- reviewed in detail today,  See PACEART report  EKG:  EKG is not ordered today.  Recent Labs: 10/17/2020: B Natriuretic Peptide 20.6; Magnesium 1.8; TSH 2.143 01/18/2021: ALT 16; BUN 23; Creatinine, Ser 1.39; Hemoglobin 12.7; Platelets 239; Potassium 4.6; Sodium 137   Wt Readings from Last 3 Encounters:  01/22/21 135 lb (61.2 kg)  01/22/21 136 lb 9.6 oz (62 kg)  12/17/20 134 lb 12.8 oz (61.1 kg)     Other studies Reviewed: Additional studies/ records that were reviewed today include: Previous EP and gen cards notes.  Recent ED visit   Assessment and Plan:  1.  Chronic systolic dysfunction s/p Medtronic dual chamber ICD (LV port plugged) euvolemic today Stable on an appropriate medical regimen Normal ICD function See Pace Art report No changes today  2. HTN Stable on current regimen.  3. DM2 Would be a good candidate for SGLT2i. But worry this would drop BP and cause undue side effects. Will defer to PCP and primary cards.   Current medicines are reviewed at length with the patient today.   The patient does not have concerns regarding her medicines.  The following changes were made today:  none  Labs/ tests ordered today include:  No orders of the defined types were placed in this encounter.    Disposition:   Follow up with Dr. Lovena Le  1  year. Sooner with issues.   Jacalyn Lefevre, PA-C  01/22/2021 10:38 AM  [facility] [address] [place] [address] [phone] (office) [phone] (fax)

## 2021-01-22 NOTE — Patient Instructions (Signed)
Medication Instructions:  Your physician recommends that you continue on your current medications as directed. Please refer to the Current Medication list given to you today.  *If you need a refill on your cardiac medications before your next appointment, please call your pharmacy*   Lab Work: None If you have labs (blood work) drawn today and your tests are completely normal, you will receive your results only by: MyChart Message (if you have MyChart) OR A paper copy in the mail If you have any lab test that is abnormal or we need to change your treatment, we will call you to review the results.   Follow-Up: At [facility], you and your health needs are our priority.  As part of our continuing mission to provide you with exceptional heart care, we have created designated Provider Care Teams.  These Care Teams include your primary Cardiologist (physician) and Advanced Practice Providers (APPs -  Physician Assistants and Nurse Practitioners) who all work together to provide you with the care you need, when you need it.  Your next appointment:   1 year(s)  The format for your next appointment:   In Person  Provider:   You may see Gregg Taylor, MD or one of the following Advanced Practice Providers on your designated Care Team:   Renee Ursuy, PA-C Michael "Andy" Tillery, PA-C   

## 2021-01-23 LAB — LIPID PANEL
Chol/HDL Ratio: 4.1 ratio (ref 0.0–4.4)
Cholesterol, Total: 199 mg/dL (ref 100–199)
HDL: 48 mg/dL (ref >39)
LDL Chol Calc (NIH): 129 mg/dL — ABNORMAL HIGH (ref 0–99)
Triglycerides: 123 mg/dL (ref 0–149)
VLDL Cholesterol Cal: 22 mg/dL (ref 5–40)

## 2021-01-23 LAB — BASIC METABOLIC PANEL
BUN/Creatinine Ratio: 19 (ref 12–28)
BUN: 29 mg/dL — ABNORMAL HIGH (ref 8–27)
CO2: 21 mmol/L (ref 20–29)
Calcium: 9.8 mg/dL (ref 8.7–10.3)
Chloride: 103 mmol/L (ref 96–106)
Creatinine, Ser: 1.5 mg/dL — ABNORMAL HIGH (ref 0.57–1.00)
Glucose: 175 mg/dL — ABNORMAL HIGH (ref 65–99)
Potassium: 4.6 mmol/L (ref 3.5–5.2)
Sodium: 144 mmol/L (ref 134–144)
eGFR: 37 mL/min/{1.73_m2} — ABNORMAL LOW (ref >59)

## 2021-01-24 ENCOUNTER — Encounter: Payer: Self-pay | Admitting: Family Medicine

## 2021-01-24 DIAGNOSIS — N390 Urinary tract infection, site not specified: Secondary | ICD-10-CM | POA: Insufficient documentation

## 2021-01-24 NOTE — Assessment & Plan Note (Signed)
Recheck lipid panel today.  Patient has been off of a statin due to myalgias for several years, however in discussion of aortic atherosclerosis present on her CT, she is willing to retry.  Rx'd Crestor 10 mg to start twice weekly.

## 2021-01-24 NOTE — Assessment & Plan Note (Signed)
A1c 7.6% today. Goal 7-8%.  Recommended resume physical activity as tolerated and balancing her diet.  Continue metformin as is.  Considered SGLT2, however given recurrent GU symptoms will hold off for now, consider when consistently improved.

## 2021-01-24 NOTE — Assessment & Plan Note (Signed)
Recent AKI when checked in the ED, had already been improving on 6/20.  Recheck BMP today per patient preference.

## 2021-01-24 NOTE — Assessment & Plan Note (Signed)
Fortunately symptoms resolved.  CT abdomen without contributing renal stone or evidence of pyelonephritis.  While culture only grew 60,000 colonies, given classic symptoms and resolution with antibiotics, encouraged to complete course.  May use Azo cranberry to assist in prevention, encouraged general GU hygiene.

## 2021-01-25 ENCOUNTER — Encounter: Payer: Medicare Other | Admitting: Physical Medicine and Rehabilitation

## 2021-01-28 ENCOUNTER — Telehealth: Payer: Medicare Other

## 2021-01-28 ENCOUNTER — Ambulatory Visit: Payer: Medicare Other | Admitting: Licensed Clinical Social Worker

## 2021-01-28 DIAGNOSIS — Z789 Other specified health status: Secondary | ICD-10-CM

## 2021-01-28 NOTE — Patient Instructions (Addendum)
Your phone appointment with Tressia Miners has been rescheduled for July 29th at 10:30   Casimer Lanius, [place] / [place]   [phone]

## 2021-01-28 NOTE — Chronic Care Management (AMB) (Signed)
    Clinical Social Work  Care Management   Phone Outreach    01/28/2021 Name: Veronica Shaw MRN: [id number] DOB: March 21, 1951  Basil Dalyn Kjos is a 70 y.o. year old female who is a primary care patient of [name], DO .   F/U phone appointment scheduled with CCM RN today to assess needs, and progress with care plan goals.   CCM LCSW providing coverage.  Patient reports a resent fall of her knee giving out on her about 2 days ago.  She now has swelling and pain.  Collaborated with [facility] front office to contact patient to schedule an appointment to see a provider.   Plan:Appointment was rescheduled with CCM RN  the end of July.  Review of patient status, including review of consultants reports, relevant laboratory and other test results, and collaboration with appropriate care team members and the patient's provider was performed as part of comprehensive patient evaluation and provision of care management services.     Casimer Lanius, [place] / [place]   [phone] 10:49 AM

## 2021-01-29 ENCOUNTER — Ambulatory Visit (INDEPENDENT_AMBULATORY_CARE_PROVIDER_SITE_OTHER): Payer: Medicare Other | Admitting: Family Medicine

## 2021-01-29 ENCOUNTER — Other Ambulatory Visit: Payer: Self-pay

## 2021-01-29 VITALS — BP 100/58 | HR 100 | Ht 61.0 in | Wt 134.4 lb

## 2021-01-29 DIAGNOSIS — N3000 Acute cystitis without hematuria: Secondary | ICD-10-CM | POA: Diagnosis not present

## 2021-01-29 DIAGNOSIS — R300 Dysuria: Secondary | ICD-10-CM

## 2021-01-29 DIAGNOSIS — M25561 Pain in right knee: Secondary | ICD-10-CM

## 2021-01-29 DIAGNOSIS — G8929 Other chronic pain: Secondary | ICD-10-CM | POA: Diagnosis not present

## 2021-01-29 LAB — POCT URINALYSIS DIP (MANUAL ENTRY)
Bilirubin, UA: NEGATIVE
Glucose, UA: NEGATIVE mg/dL
Nitrite, UA: NEGATIVE
Protein Ur, POC: NEGATIVE mg/dL
Spec Grav, UA: 1.025 (ref 1.010–1.025)
Urobilinogen, UA: 0.2 E.U./dL
pH, UA: 5.5 (ref 5.0–8.0)

## 2021-01-29 LAB — POCT UA - MICROSCOPIC ONLY

## 2021-01-29 MED ORDER — ACETAMINOPHEN 500 MG PO TABS: 1000.0000 mg | ORAL_TABLET | Freq: Three times a day (TID) | ORAL | 0 refills | Status: AC | PRN

## 2021-01-29 MED ORDER — TIZANIDINE HCL 2 MG PO TABS: 2.0000 mg | ORAL_TABLET | Freq: Three times a day (TID) | ORAL | 0 refills | Status: DC | PRN

## 2021-01-29 MED ORDER — DICLOFENAC SODIUM 1 % EX GEL
4.0000 g | Freq: Four times a day (QID) | CUTANEOUS | 2 refills | Status: DC
Start: 2021-01-29 — End: 2021-10-05

## 2021-01-29 NOTE — Patient Instructions (Addendum)
It was great seeing you today!  As discusssed for your right knee pain:  Take Tylenol 1000 mg three times a day, start taking Zanaflex (muscle relaxer) three times a day as needed.  Apply diclofenac gel fours times a day as needed for pain.    Be sure to follow up with the sports medicine doctors and consider physical therapy.   I will call you with results regarding your urine (if concerning for UTI) and if needed will send antibiotics to your pharmacy.    Take care,   Dr. Rushie Chestnut [facility]

## 2021-01-29 NOTE — Progress Notes (Signed)
   SUBJECTIVE:   CHIEF COMPLAINT / HPI:   Chief Complaint  Patient presents with   Knee Pain    3 days ago     Veronica Shaw is a 70 y.o. female here for right knee pain. Pt states she was getting off the sofa and her knee "bent and gave out on me" three days ago. She landed on the rug. Denies hitting her head. Sees a rheumatologist. Uses an over the counter ointment "that kinda burns a little" for pain.   Pt reports recently finished antibiotics for UTI. Has some abdominal cramping and dysuria  No blood.    PERTINENT  PMH / PSH: reviewed and updated as appropriate   OBJECTIVE:   BP (!) 100/58   Pulse 100   Ht 5\' 1"  (1.549 m)   Wt 134 lb 6 oz (61 kg)   LMP 08/01/2000 (Approximate)   SpO2 97%   BMI 25.39 kg/m    GEN: well appearing elderly female,  in no acute distress  CV: regular rate and rhythm RESP: no increased work of breathing, clear to ascultation bilaterally ABD: soft, non-tender, non-distended MSK: right knee: limited flexion and extension 2/2 pain, no overlying skin changes, no edema, medioposterior knee pain, no special tests attempted due to acute pain  SKIN: warm, dry    ASSESSMENT/PLAN:   No problem-specific Assessment & Plan notes found for this encounter.  Dysuria  UA unremarkable. Pt recently completed treatment. Urine culture ordered.   Chronic Knee Pain Previously followed Dr. Estanislado Pandy and has upcoming appointment however new referral needed. Offered PT however pt declined. Requests orthopedic follow-up. Sports medicine referral placed. Start voltaren gel QID PRN, Tylenol 1000 mg TID. AVOID oral NSAIDs    Lyndee Hensen, DO PGY-2, [facility] 01/29/2021

## 2021-01-29 NOTE — Progress Notes (Signed)
Office Visit Note  Patient: Veronica Shaw             Date of Birth: 1951-05-04           MRN: [id number]             PCP: Eulis Foster, MD Referring: Donnal Moat* Visit Date: 02/04/2021 Occupation: @GUAROCC @  Subjective:  Pain in both knee joints   History of Present Illness: Jazira Rhetta Cleek is a 70 y.o. female with history of osteoarthritis.  Patient reports that about 10 days ago she was getting up from a seated position and her right knee buckled.  She states that she fell and was unable to walk due to the discomfort.  She was evaluated by her PCP who recommended the use of Tylenol, Voltaren gel, and was given a prescription for tizanidine.  She noticed minimal relief with this combination of medications.  She discontinued tizanidine due to excessive drowsiness.  She states that her right knee continues to cause constant pain.  She has been using a cane to assist with ambulation.  She has ongoing discomfort in the left knee as well as pain in both hands and both feet.  She continues to have chronic pain and stiffness in her neck and lower back.   Activities of Daily Living:  Patient reports joint stiffness all day  Patient Reports nocturnal pain.  Difficulty dressing/grooming: Denies Difficulty climbing stairs: Reports Difficulty getting out of chair: Reports Difficulty using hands for taps, buttons, cutlery, and/or writing: Reports  Review of Systems  Constitutional:  Positive for fatigue.  HENT:  Positive for mouth sores, mouth dryness and nose dryness.   Eyes:  Positive for dryness. Negative for pain, itching and visual disturbance.  Respiratory:  Positive for shortness of breath and difficulty breathing. Negative for cough and hemoptysis.   Cardiovascular:  Positive for chest pain and palpitations. Negative for swelling in legs/feet.  Gastrointestinal:  Negative for abdominal pain, blood in stool, constipation and diarrhea.  Endocrine: Negative  for increased urination.  Genitourinary:  Negative for painful urination.  Musculoskeletal:  Positive for joint pain, joint pain, joint swelling, myalgias, muscle weakness, morning stiffness, muscle tenderness and myalgias.  Skin:  Negative for color change, rash and redness.  Allergic/Immunologic: Negative for susceptible to infections.  Neurological:  Positive for dizziness, headaches and weakness. Negative for numbness and memory loss.  Hematological:  Negative for swollen glands.  Psychiatric/Behavioral:  Positive for sleep disturbance. Negative for confusion.    PMFS History:  Patient Active Problem List   Diagnosis Date Noted   UTI (urinary tract infection) 01/24/2021   Acute on chronic systolic CHF (congestive heart failure), NYHA class 4 ([place]) 10/19/2020   Chronic HFrEF (heart failure with reduced ejection fraction) ([place])    Cervical dystonia 10/07/2020   Advanced care planning/counseling discussion 08/21/2020   Dementia without behavioral disturbance ([place]) 08/21/2020   Vision impairment, Right Eye 08/21/2020   Chronic kidney disease, stage 3a ([place]) 08/18/2020   Frequent urination 07/30/2020   Chronic rhinitis 12/18/2019   Chronic respiratory failure with hypoxia ([place]) 12/18/2019   Hyperlipidemia associated with type 2 diabetes mellitus ([place]) 03/05/2019   Falls frequently    Asthma 08/03/2017   Osteopenia 07/10/2015   Mild persistent asthma in adult without complication 37/10/8887   NICM (nonischemic cardiomyopathy) ([place]) 04/23/2014   Arthritis or polyarthritis, rheumatoid ([place]) 03/12/2014   ICD (implantable cardioverter-defibrillator), dual, in situ 03/06/2014   Heart Failure with Recovered Ejection Fraction, G2DD 05/31/2013   Parkinson  disease ([place]) 05/21/2013   Vertigo 03/03/2013   Gastroesophageal reflux disease without esophagitis 09/27/2012   Trigger point with neck pain 04/12/2012   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   Diabetic peripheral neuropathy ([place])  12/09/2011   Chronic urticaria 05/27/2011   Sinus tachycardia ([place]) 05/27/2011   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 05/20/2011   Type II diabetes mellitus with complication ([place]) 47/65/4650   Hypertension associated with diabetes ([place]) 09/28/2006    Past Medical History:  Diagnosis Date   Acute on chronic systolic CHF (congestive heart failure), NYHA class 4 ([place]) 10/19/2020   Asthma    Back pain 09/18/2019   Cervical disc disorder with radiculopathy of cervical region 3/54/6568   Chronic systolic CHF (congestive heart failure) ([place])    a. cMRI 4/15: EF 34% and findings - c/w NICM, normal RV size and function (RVEF 61%), Mild MR // b. Echo 2/15:  EF 30-35%, diff HK, ant-sept AK, Gr 2 DD, mild MR, trivial TR  //  c. Echo 5/17: EF 20-25%, severe diffuse HK, marked systolic dyssynchrony, grade 1 diastolic dysfunction, mild MR  //  d. [place] 5/17: Fick CO 2.9, RVSP 19, PASP 15, PW mean 2, low filing pressures and preserved CO    Cognitive impairment 10/19/2017   MOCA was administered with a score of 23/30   Diabetes mellitus    Diabetic peripheral neuropathy ([place]) 12/09/2011   Dyspnea 09/19/2012   Flu 10/17/2017   Gastritis    History of echocardiogram    Echo 6/18: EF 30-35, diffuse HK, grade 1 diastolic dysfunction, trivial MR, mild LAE, mild TR, no pericardial effusion   History of nuclear stress test    Myoview 5/18: EF 49, no ischemia, inferoseptal defect c/w LBBB artifact (intermediate risk due to EF < 50).   HTN (hypertension)    Hyperlipidemia    Hyperlipidemia associated with type 2 diabetes mellitus ([place]) 03/05/2019   Hypertension associated with diabetes ([place]) 09/28/2006   Qualifier: Diagnosis of  By: [name]     Major depression 03/31/2013   Melena 08/21/2020   NICM (nonischemic cardiomyopathy) ([place])    a. Nuclear 5/13: Normal stress nuclear study. LV Ejection Fraction: 58%  //  b. LHC 10/14: Minor luminal irregularity in prox LAD, EF 35%    Parkinson disease (HCC)    Plantar fasciitis     Plantar fasciitis, bilateral 06/15/2012   Rosacea 05/29/2009   Qualifier: Diagnosis of  By: [name] MD, [name] hearing loss (SNHL), bilateral 01/04/2017   Sleep apnea    was retested and no longer had it and so d/c CPAP   Syncope    Thoracic or lumbosacral neuritis or radiculitis, unspecified 07/03/2013   Tinnitus, bilateral 01/04/2017   Type II diabetes mellitus with complication ([place]) 08/27/5168   Diabetic eye exam done by [facility] Ophthalmology: Dr. Prudencio Burly on 11/08/13: no diabetic retinopathy. Repeat in 1 year     Urticaria     Family History  Problem Relation Age of Onset   Coronary artery disease Father        Died age 60   Heart attack Father    Diabetes Father    Coronary artery disease Mother        Died age 25   Heart attack Mother    Diabetes Mother    Parkinson's disease Sister    Heart disease Sister    Hepatitis C Sister    Diabetes Sister    Diabetes Son 18  T1DM   Healthy Daughter    Diabetes Sister    Heart disease Sister    Diabetes Sister    Heart disease Sister    Breast cancer Neg Hx    Past Surgical History:  Procedure Laterality Date   BREAST EXCISIONAL BIOPSY Left 1970   benign cyst   BREAST SURGERY Left    BUNIONECTOMY     CARDIAC CATHETERIZATION N/A 12/16/2015   Procedure: Right Heart Cath;  Surgeon: Sherren Mocha, MD;  Location: [facility];  Service: Cardiovascular;  Laterality: N/A;   CHOLECYSTECTOMY     eye lid surgery Bilateral 05/09/2019   IMPLANTABLE CARDIOVERTER DEFIBRILLATOR IMPLANT  11-25-13   MDT dual chamber ICD implanted by [name] for primary prevention   IMPLANTABLE CARDIOVERTER DEFIBRILLATOR IMPLANT N/A 11/25/2013   Procedure: IMPLANTABLE CARDIOVERTER DEFIBRILLATOR IMPLANT;  Surgeon: [name], MD;  Location: [facility] CATH LAB;  Service: Cardiovascular;  Laterality: N/A;   LEFT AND RIGHT HEART CATHETERIZATION WITH CORONARY ANGIOGRAM N/A 05/31/2013   Procedure: LEFT AND RIGHT HEART CATHETERIZATION WITH  CORONARY ANGIOGRAM;  Surgeon: [name], MD;  Location: [facility] CATH LAB;  Service: Cardiovascular;  Laterality: N/A;   TONSILLECTOMY     TUBAL LIGATION     Social History   Social History Narrative   Patient lives with her husband [name].   Patient enjoys spending time with her family, cooking, and her 2 dogs.    Patient has one daughter who lives locally, and one son who lives in [place].   Immunization History  Administered Date(s) Administered   Fluad Quad(high Dose 65+) 05/25/2020   Influenza Split 06/17/2011, 04/18/2012   Influenza Whole 05/17/2007, 06/14/2010   Influenza,inj,Quad PF,6+ Mos 05/20/2014, 06/04/2015, 03/22/2016, 05/30/2017, 04/12/2018   Influenza,inj,Quad PF,6-35 Mos 05/03/2013   Influenza-Unspecified 04/01/2019   PFIZER(Purple Top)SARS-COV-2 Vaccination 09/05/2019, 10/01/2019   Pneumococcal Conjugate-13 05/20/2014   Pneumococcal Polysaccharide-23 05/17/2007, 06/01/2013, 03/05/2019   Td 03/01/2006   Zoster, Live 08/01/2009     Objective: Vital Signs: BP 104/65 (BP Location: Left Arm, Patient Position: Sitting, Cuff Size: Normal)   Pulse 73   Ht 5' 1.5" (1.562 m)   Wt 137 lb 9.6 oz (62.4 kg)   LMP 08/01/2000 (Approximate)   BMI 25.58 kg/m    Physical Exam Vitals and nursing note reviewed.  Constitutional:      Appearance: She is well-developed.  HENT:     Head: Normocephalic and atraumatic.  Eyes:     Conjunctiva/sclera: Conjunctivae normal.  Cardiovascular:     Rate and Rhythm: Normal rate.  Pulmonary:     Effort: Pulmonary effort is normal.  Abdominal:     Palpations: Abdomen is soft.  Musculoskeletal:     Cervical back: Normal range of motion.  Skin:    General: Skin is warm and dry.     Capillary Refill: Capillary refill takes less than 2 seconds.  Neurological:     Mental Status: She is alert and oriented to person, place, and time.  Psychiatric:        Behavior: Behavior normal.     Musculoskeletal Exam: C-spine has painful  limited range of motion with lateral rotation.  Painful range of motion of lumbar spine.  Shoulder joints have good range of motion with no discomfort.  CMC joint prominence and thickening noted bilaterally.  PIP and DIP thickening consistent with osteoarthritis of both hands.  No synovitis over MCP joints or PIP joints noted.  Hip joints have good range of motion with some discomfort in the  right hip.  Painful range of motion of both knee joints, right greater than left.  Warmth and crepitus noted in the right knee joint.  No knee joint effusion noted.  Ankle joints have good range of motion with no tenderness or joint swelling.  Bunions noted bilaterally.  PIP and DIP thickening consistent with osteoarthritis of both feet noted.  Left 2nd and 3rd hammertoes noted.   CDAI Exam: CDAI Score: -- Patient Global: --; Provider Global: -- Swollen: --; Tender: -- Joint Exam 02/04/2021   No joint exam has been documented for this visit   There is currently no information documented on the homunculus. Go to the Rheumatology activity and complete the homunculus joint exam.  Investigation: No additional findings.  Imaging: CT Abdomen Pelvis W Contrast  Result Date: 01/18/2021 CLINICAL DATA:  70 year old female with right lower quadrant abdominal pain. EXAM: CT ABDOMEN AND PELVIS WITH CONTRAST TECHNIQUE: Multidetector CT imaging of the abdomen and pelvis was performed using the standard protocol following bolus administration of intravenous contrast. CONTRAST:  83mL OMNIPAQUE IOHEXOL 300 MG/ML  SOLN COMPARISON:  CT abdomen pelvis dated 01/01/2021. FINDINGS: Lower chest: The visualized lung bases are clear. Punctate calcific granuloma at the left lung base. Cardiac pacemaker wires noted. No intra-abdominal free air or free fluid. Hepatobiliary: The liver is unremarkable. No intrahepatic biliary dilatation. Cholecystectomy. No retained calcified stone noted in the central CBD. Pancreas: Unremarkable. No  pancreatic ductal dilatation or surrounding inflammatory changes. Spleen: Normal in size without focal abnormality. Adrenals/Urinary Tract: The adrenal glands unremarkable. There is mild left renal parenchyma atrophy. There is no hydronephrosis on either side. There is symmetric enhancement and excretion of contrast by both kidneys. The visualized ureters appear unremarkable. There is mild haziness of the perivesical fat. Correlation with urinalysis recommended to exclude UTI. Stomach/Bowel: There is a small hiatal hernia. There is sigmoid diverticulosis without active inflammatory changes. There is no bowel obstruction or active inflammation. The appendix is normal. Vascular/Lymphatic: Mild atherosclerotic calcification of the abdominal aorta. The IVC is unremarkable. No portal venous gas. There is no adenopathy. Reproductive: The uterus is anteverted and grossly unremarkable. The ovaries are unremarkable. Vascular clips noted in the region of the left adnexa. Other: Diffuse subcutaneous edema and stranding of the gluteal region similar to prior CT, possibly related to prior injections. No fluid collection. Musculoskeletal: Degenerative changes of the spine. No acute osseous pathology. IMPRESSION: 1. Mild haziness of the perivesical fat. Correlation with urinalysis recommended to exclude UTI. 2. Sigmoid diverticulosis. No bowel obstruction. Normal appendix. 3. Aortic Atherosclerosis (ICD10-I70.0). Electronically Signed   By: Anner Crete M.D.   On: 01/18/2021 18:25   MM 3D SCREEN BREAST BILATERAL  Result Date: 01/12/2021 CLINICAL DATA:  Screening. EXAM: DIGITAL SCREENING BILATERAL MAMMOGRAM WITH TOMOSYNTHESIS AND CAD TECHNIQUE: Bilateral screening digital craniocaudal and mediolateral oblique mammograms were obtained. Bilateral screening digital breast tomosynthesis was performed. The images were evaluated with computer-aided detection. COMPARISON:  Previous exam(s). ACR Breast Density Category c: The  breast tissue is heterogeneously dense, which may obscure small masses. FINDINGS: In the left breast, a possible asymmetry warrants further evaluation. In the right breast, no findings suspicious for malignancy. IMPRESSION: Further evaluation is suggested for possible asymmetry in the left breast. RECOMMENDATION: Diagnostic mammogram and possibly ultrasound of the left breast. (Code:[id number]) The patient will be contacted regarding the findings, and additional imaging will be scheduled. BI-RADS CATEGORY  0: Incomplete. Need additional imaging evaluation and/or prior mammograms for comparison. Electronically Signed   By: Jac Canavan.D.  On: 01/12/2021 12:12   CUP PACEART INCLINIC DEVICE CHECK  Result Date: 01/22/2021 CRT-D device check in office. Thresholds and sensing consistent with previous device measurements. Lead impedance trends stable over time. No mode switch episodes recorded. No ventricular arrhythmia episodes recorded. LV site plugged. Device programmed with appropriate safety margins. Heart failure diagnostics reviewed and trends are stable for patient. No changes made this session. Estimated longevity 2 yr, 8 mo.  Patient enrolled in remote follow up. Plan to check device remotely in 3 months and see in office in 6 months. Patient education completed including shock plan. Wallet card given.   Recent Labs: Lab Results  Component Value Date   WBC 10.8 (H) 01/18/2021   HGB 12.7 01/18/2021   PLT 239 01/18/2021   NA 144 01/22/2021   K 4.6 01/22/2021   CL 103 01/22/2021   CO2 21 01/22/2021   GLUCOSE 175 (H) 01/22/2021   BUN 29 (H) 01/22/2021   CREATININE 1.50 (H) 01/22/2021   BILITOT 1.0 01/18/2021   ALKPHOS 99 01/18/2021   AST 20 01/18/2021   ALT 16 01/18/2021   PROT 7.5 01/18/2021   ALBUMIN 4.1 01/18/2021   CALCIUM 9.8 01/22/2021   GFRAA 57 (L) 03/31/2020   QFTBGOLDPLUS NEGATIVE 01/30/2019    Speciality Comments: Pertinent PMH: CHF, Hyperlipidemia, DM II,  Parkinson's  Procedures:  No procedures performed Allergies: Aspirin, Penicillins, Prempro [conj estrog-medroxyprogest ace], Ace inhibitors, and Simvastatin   Assessment / Plan:     Visit Diagnoses: Primary osteoarthritis of both hands - CCP 36, RF<14, 14-3-3 negative, sed rate 17, uric acid 5.9, ANA-: She continues to have chronic pain and stiffness in both hands due to underlying osteoarthritis.  She has bilateral CMC joint, PIP, and DIP thickening and prominence noted.  Tenderness over both CMC joints noted.  Pain with complete fist formation.  She had an ultrasound of both hands on 05/29/2019 which was negative for synovitis.  No synovitis was noted on examination today.  We discussed the importance of joint protection and muscle strengthening.  She was given a list of natural anti-inflammatories to try taking.  Positive anti-CCP test - anti-CCP 36, RF-, 14-3-3 eta-: She has no synovitis or clinical features of rheumatoid arthritis at this time.  Ultrasound of both hands was negative for synovitis on 05/29/2019.  Chronic pain of both knees - She presents today with increased pain and stiffness in both knee joints, right greater than left.  According to the patient about 10 days ago her right knee buckled when getting out of a chair and she fell.  She had difficulty walking due to the severity of pain and has been using a cane to assist with ambulation.  She was evaluated by her PCP who recommended taking Tylenol, using Voltaren gel, and was given a prescription for tizanidine.   She discontinued tizanidine due to excessive drowsiness.  She has noticed minimal relief with Tylenol and Voltaren gel.   The patient denies having x-rays at that visit. On examination she has painful range of motion of both knees, right greater than left.  Warmth and crepitus of the right knee noted.  No knee joint effusion noted.  No obvious instability was noted.  X-rays of both knee joints were updated today and results  were reviewed with the patient while in the office.  We discussed that she is not a good candidate for cortisone injections due to her history of type 2 diabetes.  We will apply for Visco gel injections for both knees.  She was given a prescription for a right knee compression sleeve with open patella to wear for comfort and support.  She was also given a list of natural anti-inflammatories to start taking to try to improve her pain and stiffness.  Plan: XR KNEE 3 VIEW RIGHT, XR KNEE 3 VIEW LEFT  Primary osteoarthritis of both knees -She has been experiencing increased pain and stiffness in both knee joints.  X-rays of both knee joints were updated today.  We will apply for Visco gel injections for both knees.  She was strongly encouraged to purchase a right knee compression sleeve.  Pain in both feet - She has been experiencing significant discomfort in both feet.  According to the patient she saw podiatrist many years ago and was told that she had hammertoes but did not want to proceed with surgical intervention at that time.  X-rays of both feet were updated today.  We discussed the importance of wearing proper fitting shoes.  We also discussed trying metatarsal pads and having appropriate cushion in her shoes and socks.  A new referral to podiatry will be placed today to discuss treatment options.  Plan: XR Foot 2 Views Right, XR Foot 2 Views Left  Primary osteoarthritis of both feet: She has osteoarthritic changes in both feet.  PIP and DIP thickening noted.  Left second and third hammertoes noted.  We discussed the importance of wearing proper fitting shoes.  We also discussed trying metatarsal pads.  A referral to podiatry will be placed today.  DDD (degenerative disc disease), cervical - CT of the C-spine on 02/04/2019.  Chronic pain.  She has limited range of motion especially with lateral rotation.  She is not experiencing any symptoms of radiculopathy at this time.  She was advised to follow-up with  her spine specialist.  DDD (degenerative disc disease), lumbar: Chronic pain.  She has difficulty standing for prolonged periods of time due to the discomfort.  No symptoms of radiculopathy at this time.  She has occasional symptoms of right-sided sciatica.  She has been using a cane to assist with ambulation.  Other medical conditions are listed as follows:  Osteopenia, unspecified location  Chronic urticaria  Parkinson disease ([place])  Diabetic peripheral neuropathy ([place])  Type II diabetes mellitus with complication ([place])  NICM (nonischemic cardiomyopathy) ([place])  Asthma with COPD with exacerbation ([place])  Essential hypertension  History of hyperlipidemia  Chronic systolic heart failure ([place])    Orders: Orders Placed This Encounter  Procedures   XR KNEE 3 VIEW RIGHT   XR KNEE 3 VIEW LEFT   XR Foot 2 Views Right   XR Foot 2 Views Left   No orders of the defined types were placed in this encounter.     Follow-Up Instructions: Return in about 6 months (around 08/07/2021) for Osteoarthritis, DDD.   Ofilia Neas, PA-C  Note - This record has been created using Dragon software.  Chart creation errors have been sought, but may not always  have been located. Such creation errors do not reflect on  the standard of medical care.

## 2021-02-01 ENCOUNTER — Other Ambulatory Visit: Payer: Self-pay | Admitting: Neurology

## 2021-02-04 ENCOUNTER — Ambulatory Visit: Payer: Self-pay

## 2021-02-04 ENCOUNTER — Other Ambulatory Visit: Payer: Self-pay

## 2021-02-04 ENCOUNTER — Encounter: Payer: Self-pay | Admitting: Physician Assistant

## 2021-02-04 ENCOUNTER — Ambulatory Visit (INDEPENDENT_AMBULATORY_CARE_PROVIDER_SITE_OTHER): Payer: Medicare Other | Admitting: Physician Assistant

## 2021-02-04 ENCOUNTER — Telehealth: Payer: Self-pay

## 2021-02-04 VITALS — BP 104/65 | HR 73 | Ht 61.5 in | Wt 137.6 lb

## 2021-02-04 DIAGNOSIS — M25561 Pain in right knee: Secondary | ICD-10-CM

## 2021-02-04 DIAGNOSIS — M19072 Primary osteoarthritis, left ankle and foot: Secondary | ICD-10-CM

## 2021-02-04 DIAGNOSIS — I10 Essential (primary) hypertension: Secondary | ICD-10-CM

## 2021-02-04 DIAGNOSIS — M25562 Pain in left knee: Secondary | ICD-10-CM | POA: Diagnosis not present

## 2021-02-04 DIAGNOSIS — M170 Bilateral primary osteoarthritis of knee: Secondary | ICD-10-CM

## 2021-02-04 DIAGNOSIS — M79671 Pain in right foot: Secondary | ICD-10-CM | POA: Diagnosis not present

## 2021-02-04 DIAGNOSIS — G8929 Other chronic pain: Secondary | ICD-10-CM

## 2021-02-04 DIAGNOSIS — J441 Chronic obstructive pulmonary disease with (acute) exacerbation: Secondary | ICD-10-CM

## 2021-02-04 DIAGNOSIS — M19041 Primary osteoarthritis, right hand: Secondary | ICD-10-CM | POA: Diagnosis not present

## 2021-02-04 DIAGNOSIS — E118 Type 2 diabetes mellitus with unspecified complications: Secondary | ICD-10-CM | POA: Diagnosis not present

## 2021-02-04 DIAGNOSIS — Z8639 Personal history of other endocrine, nutritional and metabolic disease: Secondary | ICD-10-CM

## 2021-02-04 DIAGNOSIS — L508 Other urticaria: Secondary | ICD-10-CM | POA: Diagnosis not present

## 2021-02-04 DIAGNOSIS — M5136 Other intervertebral disc degeneration, lumbar region: Secondary | ICD-10-CM

## 2021-02-04 DIAGNOSIS — M8580 Other specified disorders of bone density and structure, unspecified site: Secondary | ICD-10-CM

## 2021-02-04 DIAGNOSIS — M79672 Pain in left foot: Secondary | ICD-10-CM

## 2021-02-04 DIAGNOSIS — M5030 Other cervical disc degeneration, unspecified cervical region: Secondary | ICD-10-CM | POA: Diagnosis not present

## 2021-02-04 DIAGNOSIS — E1142 Type 2 diabetes mellitus with diabetic polyneuropathy: Secondary | ICD-10-CM

## 2021-02-04 DIAGNOSIS — R768 Other specified abnormal immunological findings in serum: Secondary | ICD-10-CM

## 2021-02-04 DIAGNOSIS — M19042 Primary osteoarthritis, left hand: Secondary | ICD-10-CM

## 2021-02-04 DIAGNOSIS — M19071 Primary osteoarthritis, right ankle and foot: Secondary | ICD-10-CM | POA: Diagnosis not present

## 2021-02-04 DIAGNOSIS — G20A1 Parkinson's disease without dyskinesia, without mention of fluctuations: Secondary | ICD-10-CM

## 2021-02-04 DIAGNOSIS — G20 Parkinson's disease: Secondary | ICD-10-CM | POA: Diagnosis not present

## 2021-02-04 DIAGNOSIS — J45901 Unspecified asthma with (acute) exacerbation: Secondary | ICD-10-CM

## 2021-02-04 DIAGNOSIS — M51369 Other intervertebral disc degeneration, lumbar region without mention of lumbar back pain or lower extremity pain: Secondary | ICD-10-CM

## 2021-02-04 DIAGNOSIS — I428 Other cardiomyopathies: Secondary | ICD-10-CM | POA: Diagnosis not present

## 2021-02-04 DIAGNOSIS — I5022 Chronic systolic (congestive) heart failure: Secondary | ICD-10-CM

## 2021-02-04 NOTE — Progress Notes (Signed)
Please call the patient to notify her of the x-ray results.  Both knees were consistent with moderate to severe osteoarthritis and moderate chondromalacia patella.  X-rays of both feet were consistent with osteoarthritis.  No erosive changes noted.   We will be applying for visco for both knees and a referral to podiatry was placed today.

## 2021-02-04 NOTE — Addendum Note (Signed)
Addended by: Francis Gaines C on: 02/04/2021 11:02 AM   Modules accepted: Orders

## 2021-02-04 NOTE — Telephone Encounter (Signed)
Please apply for bilateral knee visco, per Taylor Dale, PA-C. Thanks!  

## 2021-02-05 ENCOUNTER — Ambulatory Visit
Admission: RE | Admit: 2021-02-05 | Discharge: 2021-02-05 | Disposition: A | Discharge status: Home / Self Care | Payer: Medicare Other | Source: Ambulatory Visit | Attending: Family Medicine | Admitting: Family Medicine

## 2021-02-05 ENCOUNTER — Ambulatory Visit: Payer: Medicare Other

## 2021-02-05 DIAGNOSIS — R928 Other abnormal and inconclusive findings on diagnostic imaging of breast: Secondary | ICD-10-CM

## 2021-02-05 DIAGNOSIS — R922 Inconclusive mammogram: Secondary | ICD-10-CM | POA: Diagnosis not present

## 2021-02-05 NOTE — Telephone Encounter (Signed)
Please call to schedule Visco knee injections.  Authorized for [employer] series bilateral knees.  Buy and Bill. Deductible does not apply. Insurance to cover 100% of allowable cost with no copay.

## 2021-02-08 ENCOUNTER — Other Ambulatory Visit: Payer: Self-pay

## 2021-02-08 ENCOUNTER — Ambulatory Visit (INDEPENDENT_AMBULATORY_CARE_PROVIDER_SITE_OTHER): Payer: Medicare Other

## 2021-02-08 ENCOUNTER — Encounter: Payer: Self-pay | Admitting: Physical Medicine and Rehabilitation

## 2021-02-08 ENCOUNTER — Encounter
Payer: Medicare Other | Attending: Physical Medicine and Rehabilitation | Admitting: Physical Medicine and Rehabilitation

## 2021-02-08 VITALS — BP 103/66 | HR 74 | Temp 98.9°F | Ht 61.5 in | Wt 135.2 lb

## 2021-02-08 DIAGNOSIS — M5010 Cervical disc disorder with radiculopathy, unspecified cervical region: Secondary | ICD-10-CM | POA: Diagnosis not present

## 2021-02-08 DIAGNOSIS — I5022 Chronic systolic (congestive) heart failure: Secondary | ICD-10-CM | POA: Diagnosis not present

## 2021-02-08 DIAGNOSIS — G243 Spasmodic torticollis: Secondary | ICD-10-CM | POA: Diagnosis not present

## 2021-02-08 DIAGNOSIS — Z95810 Presence of automatic (implantable) cardiac defibrillator: Secondary | ICD-10-CM | POA: Diagnosis not present

## 2021-02-08 NOTE — Progress Notes (Signed)
EPIC Encounter for ICM Monitoring  Patient Name: Veronica Shaw is a 70 y.o. female Date: 02/08/2021 Primary Care Physican: Eulis Foster, MD Primary Cardiologist: Cooper/Weaver PA Electrophysiologist: Lovena Le 02/08/2021 Weight: 135 lbs                                       Spoke with patient and reports feeling well at this time.  Denies fluid symptoms.  Pt unsure of how much fluid she drinks a day.  Her children do typically bring meals to her and her husband a couple of times a week.   OptiVol Thoracic impedance suggesting fluid accumulation since 01/26/2021 which correlates with multiple ER/urgent Care visits.    Prescribed: Furosemide 40 mg Take 1 tablet (40 mg total) by mouth daily.     Labs: 11/04/2020 Creatinine 1.20, BUN 25, Potassium 5.0, Sodium 138, GFR 48 10/18/2020 Creatinine 1.04, BUN 19, Potassium 4.0, Sodium 136 10/17/2020 Creatinine 1.09, BUN 19, Potassium 4.4, Sodium 137  A complete set of results can be found in Results Review.   Recommendations:  Recommendation to limit salt intake to 2000 mg daily and fluid intake to 64 oz daily.  Encouraged to call if experiencing any fluid symptoms.    Follow-up plan: ICM clinic phone appointment on 02/16/2021 to reheck fluid levels.   91 day device clinic remote transmission 02/25/2021.     EP/Cardiology Office Visits:  02/17/2021 with Richardson Dopp, PA.  Recalls 10/31/2020 with Dr. Burt Knack and 02/05/2021 for [name].     Copy of ICM check sent to Dr. Lovena Le.   3 month ICM trend: 02/08/2021.    1 Year ICM trend:     [name], RN 02/08/2021 4:50 PM

## 2021-02-08 NOTE — Progress Notes (Signed)
Pt is a 70 yr old female with hx of NICM, sCHF, DM- !c 7.2, Parkinsons' disease, CKD, and asthma and DJD/arthritis here for neck pain. Of note, not on any blood thinners. Here for f/u on  Cervical dystonia with myofascial pain as well as sciatica and nerve pain.   Injections helped- last time- First set seemed to have lasted a little longer. Doesn't know how long they lasted.   Was scheduled for end of August, but able to get in today-    Neck is really bothering her.  Esp when wakes up in the mornings.   R hand gets numb- feels mainly numbness in R hand- entire hand- and gets a lot of cramps in R hand as well- 1 night had 17 cramps in 1 night.    No improvement from 2-3 years ago- doing a cervical ESI- no improvement.  Also mentioned at the last minutes, still having sciatica- for years.      Exam: R hand- 2nd/3rd MCPs are enlarged- out of proportion compared to other MCP's.  Negative Tinel's at wrist and elbow 4th digit on R has decreased sensation on  both sides compared to 1st/2nd and 5th digits- which doesn't fit a specific dermatome.  Sensation is normal in RUE except 4th digit decreased to cold/light touch.  RUE- biceps 5/5, triceps 5/5, WE 5/5; grip 4-/5 and finger abd 4+/5 LUE- Biceps, triceps, and WE 5/5; grip 5/5 and Finger abd 5/5 Skin: Has bad bruise/healing burn on R first digit of R hand.  Scar vs bruise? Mild R cervical dystonia- Rotated towards R shoulder and tilted slightly to right.     Plan: 1 . Has a defibrillator so cannot get MRI.   2. I am concerned that pt has compression nerve in her neck- likely has either severe neuroforaminal stenosis on lower cervical spine or compression of cord, although has no myelopathy, it's still possible.  Would need Cardiology to decide if cervical surgery is possible.- and don't want to order CT myelogram until OK with Cardiology.    3.  Will start with CT of cervical spine- since last CT and myelogram showed  compression and NF stenosis on LEFT, but her Sx's are on RIGHT- if Cards feels surgery is possible, will then move forward with CT myelogram if don't get enough info from CT.  4. Spent a period of time going over this with pt as above- need to d/w her Cardiologist- who she has appointment with next week.  5. Patient here for trigger point injections for  Consent done and on chart.  Cleaned areas with alcohol and injected using a 27 gauge 1.5 inch needle  Injected 2cc Using 1% Lidocaine with no EPI  Upper traps B/L Levators B/L Posterior scalenes B/L Middle scalenes Splenius Capitus B/L Pectoralis Major Rhomboids Infraspinatus Teres Major/minor Thoracic paraspinals Lumbar paraspinals Other injections-    Patient's level of pain prior was 7/10 Current level of pain after injections is 5/10- head also upright now.   There was no bleeding or complications.  Patient was advised to drink a lot of water on day after injections to flush system Will have increased soreness for 12-48 hours after injections.  Can use Lidocaine patches the day AFTER injections Can use theracane on day of injections in places didn't inject Can use heating pad 4-6 hours AFTER injections  6. Call me once spoke with Cardiologist- and we will see after CT of cervical spine-   7. F/U end of August- if possible  for f/u and Trigger point injections.    I spent a total of 45 minutes on visit- 10 minutes injections and 35 minutes on f/u.

## 2021-02-08 NOTE — Patient Instructions (Signed)
Plan: 1 . Has a defibrillator so cannot get MRI.   2. I am concerned that pt has compression nerve in her neck- likely has either severe neuroforaminal stenosis on lower cervical spine or compression of cord, although has no myelopathy, it's still possible.  Would need Cardiology to decide if cervical surgery is possible.- and don't want to order CT myelogram until OK with Cardiology.    3.  Will start with CT of cervical spine- since last CT and myelogram showed compression and NF stenosis on LEFT, but her Sx's are on RIGHT- if Cards feels surgery is possible, will then move forward with CT myelogram if don't get enough info from CT.  4. Spent a period of time going over this with pt as above- need to d/w her Cardiologist- who she has appointment with next week.  5. Patient here for trigger point injections for  Consent done and on chart.  Cleaned areas with alcohol and injected using a 27 gauge 1.5 inch needle  Injected 2cc Using 1% Lidocaine with no EPI  Upper traps B/L Levators B/L Posterior scalenes B/L Middle scalenes Splenius Capitus B/L Pectoralis Major Rhomboids Infraspinatus Teres Major/minor Thoracic paraspinals Lumbar paraspinals Other injections-    Patient's level of pain prior was 7/10 Current level of pain after injections is 5/10- head also upright now.   There was no bleeding or complications.  Patient was advised to drink a lot of water on day after injections to flush system Will have increased soreness for 12-48 hours after injections.  Can use Lidocaine patches the day AFTER injections Can use theracane on day of injections in places didn't inject Can use heating pad 4-6 hours AFTER injections  6. Call me once spoke with Cardiologist- and we will see after CT of cervical spine-   7. F/U end of August- if possible for f/u and Trigger point injections.

## 2021-02-10 ENCOUNTER — Telehealth: Payer: Self-pay | Admitting: Neurology

## 2021-02-10 DIAGNOSIS — E118 Type 2 diabetes mellitus with unspecified complications: Secondary | ICD-10-CM

## 2021-02-10 MED ORDER — CARBIDOPA-LEVODOPA 25-100 MG PO TABS: 1.0000 | ORAL_TABLET | Freq: Three times a day (TID) | ORAL | 0 refills | Status: DC

## 2021-02-10 NOTE — Telephone Encounter (Signed)
Called patient and informed her that refills have been sent to pharmacy to last until her f/u appt 04/28/21

## 2021-02-10 NOTE — Telephone Encounter (Signed)
Pt called in regarding setting up an appt and her medicine refill for carbidopa/levo.  Has an appt 04/28/21

## 2021-02-10 NOTE — Telephone Encounter (Signed)
Ok to send refills until her f/u in Sept, thanks

## 2021-02-11 ENCOUNTER — Ambulatory Visit
Admission: RE | Admit: 2021-02-11 | Discharge: 2021-02-11 | Disposition: A | Discharge status: Home / Self Care | Payer: Medicare Other | Source: Ambulatory Visit | Attending: Physical Medicine and Rehabilitation | Admitting: Physical Medicine and Rehabilitation

## 2021-02-11 ENCOUNTER — Other Ambulatory Visit: Payer: Self-pay

## 2021-02-11 DIAGNOSIS — M4802 Spinal stenosis, cervical region: Secondary | ICD-10-CM | POA: Diagnosis not present

## 2021-02-11 DIAGNOSIS — R200 Anesthesia of skin: Secondary | ICD-10-CM | POA: Diagnosis not present

## 2021-02-11 DIAGNOSIS — G243 Spasmodic torticollis: Secondary | ICD-10-CM

## 2021-02-11 DIAGNOSIS — S12110A Anterior displaced Type II dens fracture, initial encounter for closed fracture: Secondary | ICD-10-CM | POA: Diagnosis not present

## 2021-02-11 DIAGNOSIS — M5010 Cervical disc disorder with radiculopathy, unspecified cervical region: Secondary | ICD-10-CM

## 2021-02-11 DIAGNOSIS — R531 Weakness: Secondary | ICD-10-CM | POA: Diagnosis not present

## 2021-02-15 ENCOUNTER — Ambulatory Visit (INDEPENDENT_AMBULATORY_CARE_PROVIDER_SITE_OTHER): Payer: Medicare Other | Admitting: Podiatry

## 2021-02-15 ENCOUNTER — Ambulatory Visit (INDEPENDENT_AMBULATORY_CARE_PROVIDER_SITE_OTHER): Payer: Medicare Other | Admitting: Physician Assistant

## 2021-02-15 ENCOUNTER — Other Ambulatory Visit: Payer: Self-pay

## 2021-02-15 ENCOUNTER — Encounter: Payer: Self-pay | Admitting: Podiatry

## 2021-02-15 DIAGNOSIS — M19271 Secondary osteoarthritis, right ankle and foot: Secondary | ICD-10-CM | POA: Diagnosis not present

## 2021-02-15 DIAGNOSIS — M2042 Other hammer toe(s) (acquired), left foot: Secondary | ICD-10-CM | POA: Diagnosis not present

## 2021-02-15 DIAGNOSIS — M19272 Secondary osteoarthritis, left ankle and foot: Secondary | ICD-10-CM | POA: Diagnosis not present

## 2021-02-15 DIAGNOSIS — M7742 Metatarsalgia, left foot: Secondary | ICD-10-CM | POA: Diagnosis not present

## 2021-02-15 DIAGNOSIS — M2012 Hallux valgus (acquired), left foot: Secondary | ICD-10-CM

## 2021-02-15 DIAGNOSIS — M170 Bilateral primary osteoarthritis of knee: Secondary | ICD-10-CM | POA: Diagnosis not present

## 2021-02-15 DIAGNOSIS — M2041 Other hammer toe(s) (acquired), right foot: Secondary | ICD-10-CM

## 2021-02-15 DIAGNOSIS — M21612 Bunion of left foot: Secondary | ICD-10-CM | POA: Diagnosis not present

## 2021-02-15 MED ORDER — SODIUM HYALURONATE (VISCOSUP) 20 MG/2ML IX SOSY
20.0000 mg | PREFILLED_SYRINGE | INTRA_ARTICULAR | Status: AC | PRN
  Administered 2021-02-15: 20 mg via INTRA_ARTICULAR

## 2021-02-15 MED ORDER — LIDOCAINE HCL 1 % IJ SOLN
1.5000 mL | INTRAMUSCULAR | Status: AC | PRN
  Administered 2021-02-15: 1.5 mL via INTRA_ARTICULAR

## 2021-02-15 NOTE — Progress Notes (Signed)
  Subjective:  Patient ID: Veronica Shaw, female    DOB: Dec 23, 1950,  MRN: [id number]  Chief Complaint  Patient presents with   Numbness    Numbness in right great toe. Sciatica pain bilaterally, right worse than left. Rheumatologist did foot xrays on 02/04/21    70 y.o. female presents with the above complaint. History confirmed with patient.  She had bunion surgery on the right foot many years ago.  Most of the pain is running around and under the second and third toes on the left foot  Objective:  Physical Exam: warm, good capillary refill, no trophic changes or ulcerative lesions, normal DP and PT pulses, and normal sensory exam.  Bilaterally she has left worse than right hallux valgus and hammertoe deformities within metatarsal fat pad plantarly.  Tenderness in the metatarsal heads.  Radiographs: I reviewed her nonweightbearing radiographs from rheumatology she has severe osteoarthritis of the metatarsophalangeal joint first bilateral Assessment:   1. Hallux valgus with bunions, left   2. Hammertoe of left foot   3. Hammertoe of right foot   4. Other secondary osteoarthritis of both feet      Plan:  Patient was evaluated and treated and all questions answered.  Discussed surgical and nonsurgical treatments for hallux valgus, hammertoes and ongoing the osteoarthritis in the forefoot bilaterally.  Currently here for sciatica and back problems are a more pressing issue for her.  I recommend she have these addressed.  Recommend nonsurgical treatment for the feet for now, we discussed good supportive shoe gear that avoids pressure on the bunion and allows room for the toes.  I dispensed a silicone gel pad for the hammertoes and metatarsal pad to alleviate pressure on this as well.  Follow-up as needed.  Return if symptoms worsen or fail to improve.

## 2021-02-15 NOTE — Progress Notes (Signed)
   Procedure Note  Patient: Veronica Shaw             Date of Birth: [birth year]           MRN: [id number]             Visit Date: 02/15/2021  Procedures: Visit Diagnoses:  1. Primary osteoarthritis of both knees     Euflexxa #1 bilateral knees, B/B Large Joint Inj: bilateral knee on 02/15/2021 11:07 AM Indications: pain Details: 27 G 1.5 in medial  Arthrogram: No  Medications (Right): 1.5 mL lidocaine 1 %; 20 mg Sodium Hyaluronate 20 MG/2ML Aspirate (Right): 0 mL Medications (Left): 1.5 mL lidocaine 1 %; 20 mg Sodium Hyaluronate 20 MG/2ML Aspirate (Left): 0 mL Outcome: tolerated well, no immediate complications   Patient tolerated the procedure well.  Aftercare was discussed.  Hazel Sams, PA-C

## 2021-02-16 ENCOUNTER — Ambulatory Visit (INDEPENDENT_AMBULATORY_CARE_PROVIDER_SITE_OTHER): Payer: Medicare Other

## 2021-02-16 DIAGNOSIS — Z95810 Presence of automatic (implantable) cardiac defibrillator: Secondary | ICD-10-CM

## 2021-02-16 DIAGNOSIS — I5022 Chronic systolic (congestive) heart failure: Secondary | ICD-10-CM

## 2021-02-16 NOTE — Progress Notes (Signed)
EPIC Encounter for ICM Monitoring  Patient Name: Veronica Shaw is a 70 y.o. female Date: 02/16/2021 Primary Care Physican: Lyndee Hensen, DO Primary Cardiologist: Cooper/Weaver PA Electrophysiologist: Lovena Le 02/08/2021 Weight: 135 lbs                                       Spoke with patient and reports lightheaded.  She had knee injections yesterday but thinks it making her feel bad.  She woke up last night feeling like everything looked different and a little confused.  She is lying down today hoping this will help her feel better.   OptiVol Thoracic impedance suggesting fluid returned to normal for a few days after 7/11 remote transmission but suggesting possible fluid accumulation may have returned on today's, 7/19, report.     Prescribed: Furosemide 40 mg Take 1 tablet (40 mg total) by mouth daily.     Labs: 11/04/2020 Creatinine 1.20, BUN 25, Potassium 5.0, Sodium 138, GFR 48 10/18/2020 Creatinine 1.04, BUN 19, Potassium 4.0, Sodium 136 10/17/2020 Creatinine 1.09, BUN 19, Potassium 4.4, Sodium 137  A complete set of results can be found in Results Review.   Recommendations:  Advised to discuss symptoms at tomorrow's office visit with Nicki Reaper.     Follow-up plan: ICM clinic phone appointment on 03/15/2021.   91 day device clinic remote transmission 02/25/2021.     EP/Cardiology Office Visits:  02/17/2021 with Richardson Dopp, PA.  Recalls 10/31/2020 with Dr. Burt Knack and 02/05/2021 for [name].     Copy of ICM check sent to Dr. Lovena Le and Richardson Dopp, PA as Juluis Rainier for 7/20 OV.   3 month ICM trend: 02/16/2021.    1 Year ICM trend:       [name], RN 02/16/2021 12:59 PM

## 2021-02-16 NOTE — Progress Notes (Addendum)
Cardiology Office Note:    Date:  02/17/2021   ID:  Mandy Peeks, DOB 1951/02/22, MRN [id number]  PCP:  Lyndee Hensen, DO   [facility] Providers Cardiologist:  Sherren Mocha, MD Cardiology APP:  Liliane Shi, PA-C  Electrophysiologist:  Cristopher Peru, MD      Referring MD: [name], DO   Chief Complaint:  Follow-up (CHF)    Patient Profile:    Nikcole Eischeid is a 69 y.o. female with:  HFmrEF (heart failure with mildly reduced ejection fraction)  Non-ischemic cardiomyopathy Cath 2014: no sig CAD CMR 4/15: EF 34 Echocardiogram 5/17: EF 20-25 Echocardiogram 6/18: EF 30-35 Echocardiogram 3/19: EF 60-65, Gr 2 DD Echo 09/2020: EF 40-45 S/p AICD Diabetes mellitus Hyperlipidemia OSA Asthma Parkinson's Disease Syncope 09/2017 Cervical spine DDD   Prior CV studies: Echocardiogram 10/18/2020 EF 40-45, global HK, normal RVSF, RVSP 22   Echo 10/18/17 EF 60-65, normal wall motion, grade 2 diastolic dysfunction   Echo 01/11/17 EF 30-35, diffuse HK, grade 1 diastolic dysfunction, trivial MR, mild LAE, mild TR   Nuclear stress test 12/29/16 Large size, moderate intensity fixed inferoseptal/septal perfusion defect consistent with LBBB artifact. No reversible ischemia. LVEF 49% with incoordinate septal motion. This is an intermediate risk study (due to LVEF <50).   RHC 12/16/15 CO 2.9; mean RA 1, PASP 15, mean PA 10, mean PCWP 2 1. Low intracardiac filling pressures 2. Preserved cardiac output   Echo 12/11/15 EF 20-25%, severe diffuse HK, marked systolic dyssynchrony, grade 1 diastolic dysfunction, mild MR   Cardiac MRI (4/15):   1. Mild LVE, EF 34%, Diff HK, paradoxical septal motion c/w LBBB, small focal area of late gadolinium enhancement at basal anteroseptum - may represent RV volume overload or a focal infiltrative disease such as sarcoidosis, but certainly wouldn't explain the degree of of LV dysfunction. - c/w NICM, normal RV size and function (RVEF  61%), Mild MR   LHC (10/14):   Minor luminal irregularity in prox LAD, EF 35%   Echo (2/15):   EF 30-35%, diff HK, ant-sept AK, Gr 2 DD, mild MR, trivial TR   Nuclear (5/13):   Normal stress nuclear study. LV Ejection Fraction: 58%   History of Present Illness: Ms. Penelope Fittro was last seen in 4/22.  She returns for f/u.  Of note, he OptiVol measurements yesterday suggested fluid accumulation.  She is here alone.  She missed a few days of her metoprolol.  She was feeling poorly with this.  As soon as she resumed it, she felt much better.  She feels back to herself today.  She has not had any worsening shortness of breath.  She has been sleeping on 2 pillows.  She has not had leg edema.  She has not had syncope or chest pain.  She has a lot of neck pain as well as cervical disc disease on CT scan.  She was seen by physiatry (Dr. Dagoberto Ligas).  She has suggested proceeding with CT myelogram only if she is a candidate for surgery.  She has requested our input.    Past Medical History:  Diagnosis Date   Acute on chronic systolic CHF (congestive heart failure), NYHA class 4 (HCC) 10/19/2020   Asthma    Back pain 09/18/2019   Cervical disc disorder with radiculopathy of cervical region 7/34/1937   Chronic systolic CHF (congestive heart failure) ([place])    a. cMRI 4/15: EF 34% and findings - c/w NICM, normal RV size and function (RVEF 61%),  Mild MR // b. Echo 2/15:  EF 30-35%, diff HK, ant-sept AK, Gr 2 DD, mild MR, trivial TR  //  c. Echo 5/17: EF 20-25%, severe diffuse HK, marked systolic dyssynchrony, grade 1 diastolic dysfunction, mild MR  //  d. [place] 5/17: Fick CO 2.9, RVSP 19, PASP 15, PW mean 2, low filing pressures and preserved CO    Cognitive impairment 10/19/2017   MOCA was administered with a score of 23/30   Diabetes mellitus    Diabetic peripheral neuropathy ([place]) 12/09/2011   Dyspnea 09/19/2012   Flu 10/17/2017   Gastritis    History of echocardiogram    Echo 6/18: EF 30-35, diffuse HK,  grade 1 diastolic dysfunction, trivial MR, mild LAE, mild TR, no pericardial effusion   History of nuclear stress test    Myoview 5/18: EF 49, no ischemia, inferoseptal defect c/w LBBB artifact (intermediate risk due to EF < 50).   HTN (hypertension)    Hyperlipidemia    Hyperlipidemia associated with type 2 diabetes mellitus ([place]) 03/05/2019   Hypertension associated with diabetes ([place]) 09/28/2006   Qualifier: Diagnosis of  By: [name]     Major depression 03/31/2013   Melena 08/21/2020   NICM (nonischemic cardiomyopathy) ([place])    a. Nuclear 5/13: Normal stress nuclear study. LV Ejection Fraction: 58%  //  b. LHC 10/14: Minor luminal irregularity in prox LAD, EF 35%    Parkinson disease (HCC)    Plantar fasciitis    Plantar fasciitis, bilateral 06/15/2012   Rosacea 05/29/2009   Qualifier: Diagnosis of  By: [name] MD, [name] hearing loss (SNHL), bilateral 01/04/2017   Sleep apnea    was retested and no longer had it and so d/c CPAP   Syncope    Thoracic or lumbosacral neuritis or radiculitis, unspecified 07/03/2013   Tinnitus, bilateral 01/04/2017   Type II diabetes mellitus with complication ([place]) 5/78/4696   Diabetic eye exam done by [facility]: Dr. Prudencio Burly on 11/08/13: no diabetic retinopathy. Repeat in 1 year     Urticaria     Current Medications: Current Meds  Medication Sig   acetaminophen (TYLENOL) 500 MG tablet Take 2 tablets (1,000 mg total) by mouth every 8 (eight) hours as needed.   APPLE CIDER VINEGAR PO Take 1 capsule by mouth daily.   Biotin 5000 MCG CAPS Take 1 tablet by mouth daily.    budesonide-formoterol (SYMBICORT) 160-4.5 MCG/ACT inhaler Inhale 2 puffs into the lungs 2 (two) times daily.   carbidopa-levodopa (SINEMET IR) 25-100 MG tablet Take 1 tablet by mouth 3 (three) times daily.   diclofenac Sodium (VOLTAREN) 1 % GEL Apply 4 g topically 4 (four) times daily.   famotidine (PEPCID) 20 MG tablet TAKE 1 TABLET TWICE DAILY    fluconazole (DIFLUCAN) 150 MG tablet Take 1 tablet (150 mg total) by mouth every 3 (three) days. Take one tablet, if still symptoms of vaginal itching, take 2nd in 72 hours   fluticasone (FLONASE SENSIMIST) 27.5 MCG/SPRAY nasal spray Place 2 sprays into the nose daily.   furosemide (LASIX) 40 MG tablet Take 1 tablet (40 mg total) by mouth daily.   levalbuterol (XOPENEX) 0.63 MG/3ML nebulizer solution USE 1 VIAL IN NEBULIZER EVERY 4 TO 6 HOURS AS NEEDED FOR WHEEZING AND FOR SHORTNESS OF BREATH   losartan (COZAAR) 25 MG tablet TAKE 1 TABLET EVERY DAY   meclizine (ANTIVERT) 12.5 MG tablet Take 1 tablet (12.5 mg total) by mouth daily as needed for dizziness. Should not  be long term.   melatonin 5 MG TABS Take 5 mg by mouth at bedtime.   metFORMIN (GLUCOPHAGE) 500 MG tablet Take 2 tablets (1,000 mg total) by mouth 2 (two) times daily with a meal.   metoprolol succinate (TOPROL-XL) 50 MG 24 hr tablet Take 1 tablet (50 mg total) by mouth 2 (two) times daily. Please make yearly appt with Dr. Lovena Le for July 2022 for future refills. Thank you 1st attempt   OXYGEN Inhale into the lungs at bedtime. Inhale 2L into lungs   potassium chloride (KLOR-CON) 10 MEQ tablet Take 10 mEq by mouth daily as needed (low potassium).   rosuvastatin (CRESTOR) 10 MG tablet Take 1 tablet (10 mg total) by mouth 2 (two) times a week.   spironolactone (ALDACTONE) 25 MG tablet Take 0.5 tablets (12.5 mg total) by mouth daily.   tiZANidine (ZANAFLEX) 2 MG tablet Take 1 tablet (2 mg total) by mouth every 8 (eight) hours as needed for muscle spasms.   Ubiquinone (ULTRA COQ10 PO) Take 1 tablet by mouth 3 (three) times a week.     Allergies:   Aspirin, Penicillins, Prempro [conj estrog-medroxyprogest ace], Ace inhibitors, and Simvastatin   Social History   Tobacco Use   Smoking status: Never   Smokeless tobacco: Never   Tobacco comments:    exposed to smoke during child hood (parents)  Vaping Use   Vaping Use: Never used   Substance Use Topics   Alcohol use: No    Alcohol/week: 0.0 standard drinks   Drug use: No     Family Hx: The patient's family history includes Coronary artery disease in her father and mother; Diabetes in her father, mother, sister, sister, and sister; Diabetes (age of onset: 4) in her son; Healthy in her daughter; Heart attack in her father and mother; Heart disease in her sister, sister, and sister; Hepatitis C in her sister; Parkinson's disease in her sister. There is no history of Breast cancer.  Review of Systems  Gastrointestinal:  Negative for hematochezia.  Genitourinary:  Negative for hematuria.    EKGs/Labs/Other Test Reviewed:    EKG:  EKG is not ordered today.  The ekg ordered today demonstrates N/A  Recent Labs: 10/17/2020: B Natriuretic Peptide 20.6; Magnesium 1.8; TSH 2.143 01/18/2021: ALT 16; Hemoglobin 12.7; Platelets 239 01/22/2021: BUN 29; Creatinine, Ser 1.50; Potassium 4.6; Sodium 144   Recent Lipid Panel Lab Results  Component Value Date/Time   CHOL 199 01/22/2021 09:51 AM   TRIG 123 01/22/2021 09:51 AM   HDL 48 01/22/2021 09:51 AM   LDLCALC 129 (H) 01/22/2021 09:51 AM   LDLDIRECT 111 (H) 02/10/2015 11:45 AM   ICM Clinic 02/15/21 OptiVol Thoracic impedance suggesting fluid returned to normal for a few days after 7/11 remote transmission but suggesting possible fluid accumulation may have returned on today's, 7/19, report.     Risk Assessment/Calculations:      Physical Exam:    VS:  BP (!) 102/58   Pulse 88   Ht 5' 1.5" (1.562 m)   Wt 134 lb 6.4 oz (61 kg)   LMP 08/01/2000 (Approximate)   SpO2 96%   BMI 24.98 kg/m     Wt Readings from Last 3 Encounters:  02/17/21 134 lb 6.4 oz (61 kg)  02/08/21 135 lb 3.2 oz (61.3 kg)  02/04/21 137 lb 9.6 oz (62.4 kg)     Constitutional:      Appearance: Healthy appearance. Not in distress.  Neck:     Vascular: JVD normal.  Pulmonary:     Effort: Pulmonary effort is normal.     Breath sounds: No  wheezing. No rales.  Cardiovascular:     Normal rate. Regular rhythm. Normal S1. Normal S2.      Murmurs: There is no murmur.  Edema:    Peripheral edema absent.  Abdominal:     Palpations: Abdomen is soft. There is no hepatomegaly.  Skin:    General: Skin is warm and dry.  Neurological:     Mental Status: Alert and oriented to person, place and time.     Cranial Nerves: Cranial nerves are intact.         ASSESSMENT & PLAN:    1. 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) EF 40-45 by echocardiogram in March 2022.  On exam, her volume status seems stable.  However, her OptiVol yesterday was elevated.  I have asked her to take an extra dose of furosemide 40 mg today.  I will ask for her device to be reinterrogated next week to make sure there OptiVol measurements have returned to baseline.  She is mainly limited by asthma.  NYHA IIb-III.  Continue current dose of metoprolol succinate, losartan, spironolactone.  Titration of GDMT is mainly limited by hypotension.  For heart rates today are in the 80s.  If she continues to have elevated heart rates (over 70), we could consider adding ivabradine.  Follow-up with Dr. Burt Knack or me in 2 months.  2. Essential hypertension The patient's blood pressure is controlled on her current regimen.  Continue current therapy.   3. Stage 3a chronic kidney disease (HCC) Recent creatinine stable.  4. ICD (implantable cardioverter-defibrillator), dual, in situ Continue follow-up with the EP as planned.  5. Preoperative cardiovascular examination According to the revised cardiac risk index, her risk of perioperative major cardiac event is low at 0.9%.  However, given her overall history, I think she has an elevated risk of perioperative CV complications.  However, she does not have prohibitive risk.  She is fairly sedentary and cannot achieve 4 METS.  Therefore, if surgery is required, she would need to undergo stress testing for full restratification prior  to her procedure.  If she does require surgery, we should be notified so that we can arrange stress testing.    Dispo:  Return in about 2 months (around 04/20/2021) for Routine Follow Up, w/ Dr. Burt Knack, or Richardson Dopp, PA-C.   Medication Adjustments/Labs and Tests Ordered: Current medicines are reviewed at length with the patient today.  Concerns regarding medicines are outlined above.  Tests Ordered: No orders of the defined types were placed in this encounter.  Medication Changes: No orders of the defined types were placed in this encounter.   Signed, Richardson Dopp, PA-C  02/17/2021 5:36 PM    [facility] [address], [place], [place]  [address] Phone: [phone]; Fax: [phone]

## 2021-02-17 ENCOUNTER — Ambulatory Visit (INDEPENDENT_AMBULATORY_CARE_PROVIDER_SITE_OTHER): Payer: Medicare Other | Admitting: Physician Assistant

## 2021-02-17 ENCOUNTER — Other Ambulatory Visit: Payer: Self-pay

## 2021-02-17 ENCOUNTER — Encounter: Payer: Self-pay | Admitting: Physician Assistant

## 2021-02-17 VITALS — BP 102/58 | HR 88 | Ht 61.5 in | Wt 134.4 lb

## 2021-02-17 DIAGNOSIS — Z01810 Encounter for preprocedural cardiovascular examination: Secondary | ICD-10-CM

## 2021-02-17 DIAGNOSIS — I5020 Unspecified systolic (congestive) heart failure: Secondary | ICD-10-CM

## 2021-02-17 DIAGNOSIS — I10 Essential (primary) hypertension: Secondary | ICD-10-CM | POA: Diagnosis not present

## 2021-02-17 DIAGNOSIS — Z95810 Presence of automatic (implantable) cardiac defibrillator: Secondary | ICD-10-CM | POA: Diagnosis not present

## 2021-02-17 DIAGNOSIS — N1831 Chronic kidney disease, stage 3a: Secondary | ICD-10-CM

## 2021-02-17 NOTE — Patient Instructions (Signed)
Medication Instructions:   Your physician recommends that you continue on your current medications as directed. Please refer to the Current Medication list given to you today.  PLEASE take one extra dose Lasix one tablet by mouth ( 40 mg ) today only!!!  *If you need a refill on your cardiac medications before your next appointment, please call your pharmacy*  Lab Work:  -NONE  If you have labs (blood work) drawn today and your tests are completely normal, you will receive your results only by: [place] (if you have MyChart) OR A paper copy in the mail If you have any lab test that is abnormal or we need to change your treatment, we will call you to review the results.  Testing/Procedures:  -NONE  Follow-Up: At [facility], you and your health needs are our priority.  As part of our continuing mission to provide you with exceptional heart care, we have created designated Provider Care Teams.  These Care Teams include your primary Cardiologist (physician) and Advanced Practice Providers (APPs -  Physician Assistants and Nurse Practitioners) who all work together to provide you with the care you need, when you need it.  We recommend signing up for the patient portal called "MyChart".  Sign up information is provided on this After Visit Summary.  MyChart is used to connect with patients for Virtual Visits (Telemedicine).  Patients are able to view lab/test results, encounter notes, upcoming appointments, etc.  Non-urgent messages can be sent to your provider as well.   To learn more about what you can do with MyChart, go to [email or web address].    Your next appointment:   2 month(s) With Richardson Dopp, PA-C on Wednesday, September 21 @ 11:15 am.   The format for your next appointment:   In Person  Provider:   Richardson Dopp, PA-C   Other Instructions  Nicki Reaper will have Margarita Grizzle check you optivol in 1 week.  If you do have to have neck surgery you will need a stress test.

## 2021-02-22 ENCOUNTER — Ambulatory Visit (INDEPENDENT_AMBULATORY_CARE_PROVIDER_SITE_OTHER): Payer: Medicare Other

## 2021-02-22 ENCOUNTER — Ambulatory Visit (INDEPENDENT_AMBULATORY_CARE_PROVIDER_SITE_OTHER): Payer: Medicare Other | Admitting: Physician Assistant

## 2021-02-22 ENCOUNTER — Other Ambulatory Visit: Payer: Self-pay

## 2021-02-22 DIAGNOSIS — M170 Bilateral primary osteoarthritis of knee: Secondary | ICD-10-CM | POA: Diagnosis not present

## 2021-02-22 DIAGNOSIS — I5020 Unspecified systolic (congestive) heart failure: Secondary | ICD-10-CM

## 2021-02-22 DIAGNOSIS — Z95810 Presence of automatic (implantable) cardiac defibrillator: Secondary | ICD-10-CM

## 2021-02-22 MED ORDER — LIDOCAINE HCL 1 % IJ SOLN
1.5000 mL | INTRAMUSCULAR | Status: AC | PRN
  Administered 2021-02-22: 1.5 mL via INTRA_ARTICULAR

## 2021-02-22 MED ORDER — SODIUM HYALURONATE (VISCOSUP) 20 MG/2ML IX SOSY
20.0000 mg | PREFILLED_SYRINGE | INTRA_ARTICULAR | Status: AC | PRN
  Administered 2021-02-22: 20 mg via INTRA_ARTICULAR

## 2021-02-22 NOTE — Progress Notes (Signed)
   Procedure Note  Patient: Veronica Shaw             Date of Birth: [birth year]           MRN: [id number]             Visit Date: 02/22/2021  Procedures: Visit Diagnoses:  1. Primary osteoarthritis of both knees     Euflexxa #2 bilateral knees, B/B Large Joint Inj: bilateral knee on 02/22/2021 9:11 AM Indications: pain Details: 27 G 1.5 in medial  Arthrogram: No  Medications (Right): 1.5 mL lidocaine 1 %; 20 mg Sodium Hyaluronate 20 MG/2ML Aspirate (Right): 0 mL Medications (Left): 1.5 mL lidocaine 1 %; 20 mg Sodium Hyaluronate 20 MG/2ML Aspirate (Left): 0 mL Outcome: tolerated well, no immediate complications   Patient tolerated the procedure well.  Aftercare was discussed.  Hazel Sams, PA-C

## 2021-02-23 ENCOUNTER — Telehealth: Payer: Self-pay | Admitting: Pharmacy Technician

## 2021-02-23 DIAGNOSIS — Z596 Low income: Secondary | ICD-10-CM

## 2021-02-23 NOTE — Progress Notes (Signed)
[facility])                                            [place] Team    02/23/2021  Veronica Shaw 02-07-1951 [id number]                                      Medication Assistance Referral  Referral From: [place]  Medication/Company: Symbicort / [place] Patient application portion:  Mailed Provider application portion: Faxed  to Rexene Edison, NP at [employer] Provider address/fax verified via: [employer]. Trisha Ken, [place]  [phone]

## 2021-02-23 NOTE — Progress Notes (Signed)
EPIC Encounter for ICM Monitoring  Patient Name: Veronica Shaw is a 70 y.o. female Date: 02/23/2021 Primary Care Physican: Lyndee Hensen, DO Primary Cardiologist: Cooper/Weaver PA Electrophysiologist: Lovena Le 02/08/2021 Weight: 135 lbs                                       Spoke with patient and reports feeling fine.     OptiVol Thoracic impedance suggesting fluid levels returned to normal after being instructed by Richardson Dopp, PA to take extra Furosemide during OV last week.   Prescribed: Furosemide 40 mg Take 1 tablet (40 mg total) by mouth daily.     Labs: 01/22/2021 Creatinine 1.50, BUN 29, Potassium 4.6, Sodium 144 01/18/2021 Creatinine 1.39, BUN 23, Potassium 4.6, Sodium 137, GFR 41  01/01/2021 Creatinine 1.72, BUN 24, Potassium 4.1, Sodium 133, GFR 31  11/04/2020 Creatinine 1.20, BUN 25, Potassium 5.0, Sodium 138, GFR 48 10/18/2020 Creatinine 1.04, BUN 19, Potassium 4.0, Sodium 136 10/17/2020 Creatinine 1.09, BUN 19, Potassium 4.4, Sodium 137  A complete set of results can be found in Results Review.   Recommendations:  No changes and encouraged to call if experiencing any fluid symptoms.   Follow-up plan: ICM clinic phone appointment on 03/15/2021.   91 day device clinic remote transmission 02/25/2021.     EP/Cardiology Office Visits:  02/17/2021 with Richardson Dopp, PA.  Recalls 10/31/2020 with Dr. Burt Knack and 02/05/2021 for [name].     Copy of ICM check sent to Dr. Lovena Le and Richardson Dopp, PA to show effectiveness of extra Furosemide.   3 month ICM trend: 02/22/2021.    1 Year ICM trend:       [name], RN 02/23/2021 2:39 PM

## 2021-02-25 ENCOUNTER — Ambulatory Visit (INDEPENDENT_AMBULATORY_CARE_PROVIDER_SITE_OTHER): Payer: Medicare Other

## 2021-02-25 DIAGNOSIS — I428 Other cardiomyopathies: Secondary | ICD-10-CM

## 2021-02-25 DIAGNOSIS — I5022 Chronic systolic (congestive) heart failure: Secondary | ICD-10-CM

## 2021-02-25 DIAGNOSIS — N1831 Chronic kidney disease, stage 3a: Secondary | ICD-10-CM | POA: Diagnosis not present

## 2021-02-25 DIAGNOSIS — E559 Vitamin D deficiency, unspecified: Secondary | ICD-10-CM | POA: Diagnosis not present

## 2021-02-26 ENCOUNTER — Ambulatory Visit: Payer: Medicare Other

## 2021-02-26 NOTE — Patient Instructions (Signed)
Visit Information  Ms. Marna Plumley  it was nice speaking with you. Please call me directly [phone] if you have questions about the goals we discussed.   Goals Addressed             This Visit's Progress    Track and Manage Fluids and Swelling       Timeframe:  Long-Range Goal Priority:  High Start Date:   11/03/20                        Expected End Date:     04/30/21    - call office if I gain more than 2 pounds in one day or 5 pounds in one week - keep legs up while sitting - track weight in diary - use salt in moderation - watch for swelling in feet, ankles and legs every day - weigh myself daily    Why is this important?   It is important to check your weight daily and watch how much salt and liquids you have.  It will help you to manage your heart failure     Track and Manage Symptoms       Timeframe:  Long-Range Goal Priority:  High Start Date:11/03/20                            Expected End Date: 04/30/21                   - develop a rescue plan - follow rescue plan if symptoms flare-up - know when to call the doctor - track symptoms and what helps feel better or worse    Why is this important?   You will be able to handle your symptoms better if you keep track of them.  Making some simple changes to your lifestyle will help.  Eating healthy is one thing you can do to take good care of yourself.    Notes:         The patient verbalized understanding of instructions, educational materials, and care plan provided today and declined offer to receive copy of patient instructions, educational materials, and care plan.   Follow up Plan: Patient would like continued follow-up.  CCM RNCM will outreach the patient within the next 4 weeks  Patient will call office if needed prior to next encounter  Lazaro Arms, RN  [phone]

## 2021-02-26 NOTE — Chronic Care Management (AMB) (Signed)
Chronic Care Management   CCM RN Visit Note  02/26/2021 Name: Veronica Shaw MRN: [id number] DOB: 10-25-1950  Subjective: Veronica Shaw is a 70 y.o. year old female who is a primary care patient of Lyndee Hensen, DO. The care management team was consulted for assistance with disease management and care coordination needs.    Engaged with patient by telephone for follow up visit in response to provider referral for case management and/or care coordination services.   Consent to Services:  The patient was given information about Chronic Care Management services, agreed to services, and gave verbal consent prior to initiation of services.  Please see initial visit note for detailed documentation.   Patient agreed to services and verbal consent obtained.    Assessment:  The patient stated that she had experience some lightheadedness and dizzy but she is feeling better today.  No shortness of breath chest pain or swelling. . See Care Plan below for interventions and patient self-care actives. Follow up Plan: Patient would like continued follow-up.  CCM RNCM will outreach the patient within the next $ weeks  Patient will call office if needed prior to next encounter  Review of patient past medical history, allergies, medications, health status, including review of consultants reports, laboratory and other test data, was performed as part of comprehensive evaluation and provision of chronic care management services.   SDOH (Social Determinants of Health) assessments and interventions performed:    CCM Care Plan  Allergies  Allergen Reactions   Aspirin Swelling and Other (See Comments)    Other reaction(s): Other (See Comments) Causes nose bleeds *ONLY THE COATED ASA* Causes nose bleeds *ONLY THE COATED ASA*   Penicillins Shortness Of Breath and Rash    Shortness of Breath - Throat felt like it was closing.    Prempro [Conj Estrog-Medroxyprogest Ace] Shortness Of Breath     Throat swelling Throat swelling   Ace Inhibitors Cough   Simvastatin Other (See Comments) and Rash    Muscle aches    Outpatient Encounter Medications as of 02/26/2021  Medication Sig   acetaminophen (TYLENOL) 500 MG tablet Take 2 tablets (1,000 mg total) by mouth every 8 (eight) hours as needed.   APPLE CIDER VINEGAR PO Take 1 capsule by mouth daily.   Biotin 5000 MCG CAPS Take 1 tablet by mouth daily.    budesonide-formoterol (SYMBICORT) 160-4.5 MCG/ACT inhaler Inhale 2 puffs into the lungs 2 (two) times daily.   carbidopa-levodopa (SINEMET IR) 25-100 MG tablet Take 1 tablet by mouth 3 (three) times daily.   diclofenac Sodium (VOLTAREN) 1 % GEL Apply 4 g topically 4 (four) times daily.   famotidine (PEPCID) 20 MG tablet TAKE 1 TABLET TWICE DAILY   fluconazole (DIFLUCAN) 150 MG tablet Take 1 tablet (150 mg total) by mouth every 3 (three) days. Take one tablet, if still symptoms of vaginal itching, take 2nd in 72 hours   fluticasone (FLONASE SENSIMIST) 27.5 MCG/SPRAY nasal spray Place 2 sprays into the nose daily.   furosemide (LASIX) 40 MG tablet Take 1 tablet (40 mg total) by mouth daily.   levalbuterol (XOPENEX) 0.63 MG/3ML nebulizer solution USE 1 VIAL IN NEBULIZER EVERY 4 TO 6 HOURS AS NEEDED FOR WHEEZING AND FOR SHORTNESS OF BREATH   losartan (COZAAR) 25 MG tablet TAKE 1 TABLET EVERY DAY   meclizine (ANTIVERT) 12.5 MG tablet Take 1 tablet (12.5 mg total) by mouth daily as needed for dizziness. Should not be long term.   melatonin 5 MG TABS Take  5 mg by mouth at bedtime.   metFORMIN (GLUCOPHAGE) 500 MG tablet Take 2 tablets (1,000 mg total) by mouth 2 (two) times daily with a meal.   metoprolol succinate (TOPROL-XL) 50 MG 24 hr tablet Take 1 tablet (50 mg total) by mouth 2 (two) times daily. Please make yearly appt with Dr. Lovena Le for July 2022 for future refills. Thank you 1st attempt   OXYGEN Inhale into the lungs at bedtime. Inhale 2L into lungs   potassium chloride (KLOR-CON) 10 MEQ  tablet Take 10 mEq by mouth daily as needed (low potassium).   rosuvastatin (CRESTOR) 10 MG tablet Take 1 tablet (10 mg total) by mouth 2 (two) times a week.   spironolactone (ALDACTONE) 25 MG tablet Take 0.5 tablets (12.5 mg total) by mouth daily.   tiZANidine (ZANAFLEX) 2 MG tablet Take 1 tablet (2 mg total) by mouth every 8 (eight) hours as needed for muscle spasms.   Ubiquinone (ULTRA COQ10 PO) Take 1 tablet by mouth 3 (three) times a week.   No facility-administered encounter medications on file as of 02/26/2021.    Patient Active Problem List   Diagnosis Date Noted   UTI (urinary tract infection) 01/24/2021   Acute on chronic systolic CHF (congestive heart failure), NYHA class 4 ([place]) 10/19/2020   Chronic HFrEF (heart failure with reduced ejection fraction) ([place])    Cervical dystonia 10/07/2020   Advanced care planning/counseling discussion 08/21/2020   Dementia without behavioral disturbance ([place]) 08/21/2020   Vision impairment, Right Eye 08/21/2020   Chronic kidney disease, stage 3a ([place]) 08/18/2020   Frequent urination 07/30/2020   Body mass index (BMI) 26.0-26.9, adult 01/29/2020   Chronic rhinitis 12/18/2019   Chronic respiratory failure with hypoxia ([place]) 12/18/2019   Hyperlipidemia associated with type 2 diabetes mellitus ([place]) 03/05/2019   Cervical radiculopathy 10/24/2018   Falls frequently    Asthma 08/03/2017   Osteopenia 07/10/2015   Mild persistent asthma in adult without complication [id number]   NICM (nonischemic cardiomyopathy) ([place]) 04/23/2014   Arthritis or polyarthritis, rheumatoid ([place]) 03/12/2014   ICD (implantable cardioverter-defibrillator), dual, in situ 03/06/2014   Heart Failure with Recovered Ejection Fraction, G2DD 05/31/2013   Parkinson disease ([place]) 05/21/2013   Vertigo 03/03/2013   Gastroesophageal reflux disease without esophagitis 09/27/2012   Trigger point with neck pain 04/12/2012   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   Diabetic  peripheral neuropathy ([place]) 12/09/2011   Chronic urticaria 05/27/2011   Sinus tachycardia ([place]) 05/27/2011   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) 09/28/2006    Conditions to be addressed/monitored:CHF  Care Plan : RN Case Manager  Updates made by Lazaro Arms, RN since 02/26/2021 12:00 AM     Problem: Symptom Exacerbation (Heart Failure)      Long-Range Goal: Patient to manage and monitor signs and symptoms of Heart Failure   Start Date: 11/03/2020  Expected End Date: 04/30/2021  Priority: High  Note:   Current Barriers:  Knowledge deficit related to basic heart failure pathophysiology and self care management  Case Manager Clinical Goal(S):  patient will verbalize understanding of Heart Failure Action Plan and when to call doctor  Interventions:  Basic overview and discussion of  Heart Failure reviewed Provided verbal education on low sodium diet Provided education on low sodium diet in My Chart Reviewed Heart Failure Action Plan  Advised patient to weigh each morning after emptying bladder Discussed importance of daily weight and advised patient  to weigh and record daily-patient's weight today was 133 lbs. Reviewed role of diuretics in prevention of fluid overload and management of heart failure-patient states that she is taking her medication.  She stated that she feels that the fluid pill is having her to urinate to much. She said that she had call from her nephrologist and they discussed her labs. She stated that some of her labs were abnormal but was unable to explain to me exactly what all was discussed.  She has an appointment to come in on 03/02/21 and have more labs redrawn. Patient states she is feeling good today no chest pain, swelling or shortness of breath. She stated that she had been feeling a little dizzy and light headed  Patient Goals/Self Care Activities:  Takes  Heart Failure Medications as prescribed Weighs daily and record (notifying MD of 3 lb weight gain over night or 5 lb in a week) Verbalizes understanding of and follows CHF Action Plan Adheres to low sodium diet      Lazaro Arms RN, BSN, [place] Phone: [phone] I Fax: [phone]

## 2021-02-27 LAB — CUP PACEART REMOTE DEVICE CHECK
Battery Remaining Longevity: 30 mo
Battery Voltage: 2.94 V
Brady Statistic AP VP Percent: 0 %
Brady Statistic AP VS Percent: 0.01 %
Brady Statistic AS VP Percent: 0.04 %
Brady Statistic AS VS Percent: 99.96 %
Brady Statistic RA Percent Paced: 0.01 %
Brady Statistic RV Percent Paced: 0.04 %
Date Time Interrogation Session: 20220728043823
HighPow Impedance: 77 Ohm
Implantable Lead Implant Date: 20150427
Implantable Lead Implant Date: 20150427
Implantable Lead Location: 753859
Implantable Lead Location: 753860
Implantable Lead Model: 5076
Implantable Lead Model: 6935
Implantable Pulse Generator Implant Date: 20150427
Lead Channel Impedance Value: 399 Ohm
Lead Channel Impedance Value: 4047 Ohm
Lead Channel Impedance Value: 4047 Ohm
Lead Channel Impedance Value: 4047 Ohm
Lead Channel Impedance Value: 551 Ohm
Lead Channel Impedance Value: 646 Ohm
Lead Channel Pacing Threshold Amplitude: 0.5 V
Lead Channel Pacing Threshold Amplitude: 0.625 V
Lead Channel Pacing Threshold Pulse Width: 0.4 ms
Lead Channel Pacing Threshold Pulse Width: 0.4 ms
Lead Channel Sensing Intrinsic Amplitude: 14.625 mV
Lead Channel Sensing Intrinsic Amplitude: 14.625 mV
Lead Channel Sensing Intrinsic Amplitude: 2.25 mV
Lead Channel Sensing Intrinsic Amplitude: 2.25 mV
Lead Channel Setting Pacing Amplitude: 2 V
Lead Channel Setting Pacing Amplitude: 2.5 V
Lead Channel Setting Pacing Pulse Width: 0.4 ms
Lead Channel Setting Sensing Sensitivity: 0.3 mV

## 2021-03-01 ENCOUNTER — Other Ambulatory Visit: Payer: Self-pay

## 2021-03-01 ENCOUNTER — Ambulatory Visit (INDEPENDENT_AMBULATORY_CARE_PROVIDER_SITE_OTHER): Payer: Medicare Other | Admitting: Physician Assistant

## 2021-03-01 DIAGNOSIS — M170 Bilateral primary osteoarthritis of knee: Secondary | ICD-10-CM

## 2021-03-01 MED ORDER — SODIUM HYALURONATE (VISCOSUP) 20 MG/2ML IX SOSY
20.0000 mg | PREFILLED_SYRINGE | INTRA_ARTICULAR | Status: AC | PRN
  Administered 2021-03-01: 20 mg via INTRA_ARTICULAR

## 2021-03-01 MED ORDER — LIDOCAINE HCL 1 % IJ SOLN
1.5000 mL | INTRAMUSCULAR | Status: AC | PRN
  Administered 2021-03-01: 1.5 mL via INTRA_ARTICULAR

## 2021-03-01 NOTE — Progress Notes (Signed)
   Procedure Note  Patient: Veronica Shaw             Date of Birth: [birth year]           MRN: [id number]             Visit Date: 03/01/2021  Procedures: Visit Diagnoses:  1. Primary osteoarthritis of both knees     Euflexxa #3 bilateral knees, B/B Large Joint Inj: bilateral knee on 03/01/2021 10:26 AM Indications: pain Details: 27 G 1.5 in medial  Arthrogram: No  Medications (Right): 1.5 mL lidocaine 1 %; 20 mg Sodium Hyaluronate 20 MG/2ML Aspirate (Right): 0 mL Medications (Left): 1.5 mL lidocaine 1 %; 20 mg Sodium Hyaluronate 20 MG/2ML Aspirate (Left): 0 mL Outcome: tolerated well, no immediate complications   Patient tolerated the procedure well.  Aftercare was discussed.  Hazel Sams, PA-C

## 2021-03-02 DIAGNOSIS — I5022 Chronic systolic (congestive) heart failure: Secondary | ICD-10-CM | POA: Diagnosis not present

## 2021-03-02 DIAGNOSIS — I428 Other cardiomyopathies: Secondary | ICD-10-CM | POA: Diagnosis not present

## 2021-03-02 DIAGNOSIS — E875 Hyperkalemia: Secondary | ICD-10-CM | POA: Diagnosis not present

## 2021-03-02 DIAGNOSIS — I129 Hypertensive chronic kidney disease with stage 1 through stage 4 chronic kidney disease, or unspecified chronic kidney disease: Secondary | ICD-10-CM | POA: Diagnosis not present

## 2021-03-02 DIAGNOSIS — N1831 Chronic kidney disease, stage 3a: Secondary | ICD-10-CM | POA: Diagnosis not present

## 2021-03-02 DIAGNOSIS — E559 Vitamin D deficiency, unspecified: Secondary | ICD-10-CM | POA: Diagnosis not present

## 2021-03-08 ENCOUNTER — Other Ambulatory Visit: Payer: Self-pay | Admitting: Family Medicine

## 2021-03-08 NOTE — Telephone Encounter (Signed)
Covering for Dr. Susa Simmonds. Not a long term medication. Last refill 5 months prior and given 10 tablets at that time.

## 2021-03-12 ENCOUNTER — Telehealth: Payer: Self-pay | Admitting: Pharmacy Technician

## 2021-03-12 DIAGNOSIS — Z596 Low income: Secondary | ICD-10-CM

## 2021-03-12 NOTE — Progress Notes (Signed)
[facility])                                            [place] Team    03/12/2021  Veronica Shaw 02-Mar-1951 [id number]  Received both patient and provider portion(s) of patient assistance application(s) for Symbicort. Faxed completed application and required documents into [place].    [name] P. Magie Ciampa, [place]  [phone]

## 2021-03-15 ENCOUNTER — Ambulatory Visit (INDEPENDENT_AMBULATORY_CARE_PROVIDER_SITE_OTHER): Payer: Medicare Other

## 2021-03-15 DIAGNOSIS — Z95810 Presence of automatic (implantable) cardiac defibrillator: Secondary | ICD-10-CM

## 2021-03-15 DIAGNOSIS — I5020 Unspecified systolic (congestive) heart failure: Secondary | ICD-10-CM

## 2021-03-17 ENCOUNTER — Telehealth: Payer: Self-pay

## 2021-03-17 NOTE — Progress Notes (Signed)
EPIC Encounter for ICM Monitoring  Patient Name: Veronica Shaw is a 70 y.o. female Date: 03/17/2021 Primary Care Physican: Lyndee Hensen, DO Primary Cardiologist: Cooper/Weaver PA Electrophysiologist: Lovena Le 02/08/2021 Weight: 135 lbs                                       Attempted call to patient and unable to reach.  Left detailed message per DPR regarding transmission. Transmission reviewed.    OptiVol Thoracic impedance suggesting normal fluid levels.   Prescribed: Furosemide 40 mg Take 1 tablet (40 mg total) by mouth daily.     Labs: 01/22/2021 Creatinine 1.50, BUN 29, Potassium 4.6, Sodium 144 01/18/2021 Creatinine 1.39, BUN 23, Potassium 4.6, Sodium 137, GFR 41  01/01/2021 Creatinine 1.72, BUN 24, Potassium 4.1, Sodium 133, GFR 31  11/04/2020 Creatinine 1.20, BUN 25, Potassium 5.0, Sodium 138, GFR 48 10/18/2020 Creatinine 1.04, BUN 19, Potassium 4.0, Sodium 136 10/17/2020 Creatinine 1.09, BUN 19, Potassium 4.4, Sodium 137  A complete set of results can be found in Results Review.   Recommendations:  Left voice mail with ICM number and encouraged to call if experiencing any fluid symptoms.   Follow-up plan: ICM clinic phone appointment on 04/19/2021.   91 day device clinic remote transmission 05/27/2021.     EP/Cardiology Office Visits: 04/21/2021 with Richardson Dopp, PA.  Recalls 10/31/2020 with Dr. Burt Knack and 02/05/2021 for [name].     Copy of ICM check sent to Dr. Lovena Le.   3 month ICM trend: 03/15/2021.    1 Year ICM trend:       [name], RN 03/17/2021 12:56 PM

## 2021-03-17 NOTE — Telephone Encounter (Signed)
Remote ICM transmission received.  Attempted call to patient regarding ICM remote transmission and left detailed message per DPR.  Advised to return call for any fluid symptoms or questions. Next ICM remote transmission scheduled 04/19/2021.

## 2021-03-19 ENCOUNTER — Telehealth: Payer: Self-pay | Admitting: Pharmacy Technician

## 2021-03-19 DIAGNOSIS — Z596 Low income: Secondary | ICD-10-CM

## 2021-03-19 NOTE — Progress Notes (Signed)
[facility])                                            [place] Team    03/19/2021  Veronica Shaw 08-18-1950 [id number]  Care coordination call placed to [employer] in regards to Symbicort application.  Spoke to [facility] who informed patient was APPROVED 03/17/21-07/31/21. She informed 3 inhalers would be shipped out on Monday 03/22/21 with delivery to the patient's home in the next 7-10 business days.  Brinley Treanor P. Aitanna Haubner, [place]  [phone]

## 2021-03-23 NOTE — Progress Notes (Signed)
Remote ICD transmission.   

## 2021-03-24 ENCOUNTER — Other Ambulatory Visit: Payer: Self-pay | Admitting: Family Medicine

## 2021-03-24 DIAGNOSIS — G8929 Other chronic pain: Secondary | ICD-10-CM

## 2021-03-26 ENCOUNTER — Ambulatory Visit: Payer: Medicare Other

## 2021-03-26 NOTE — Patient Instructions (Signed)
Visit Information  [name]  it was nice speaking with you. Please call me directly [phone] if you have questions about the goals we discussed.   Goals Addressed             This Visit's Progress    Track and Manage Fluids and Swelling       Timeframe:  Long-Range Goal Priority:  High Start Date:   11/03/20                        Expected End Date:  04/30/21     - call office if I gain more than 2 pounds in one day or 5 pounds in one week - keep legs up while sitting - track weight in diary - use salt in moderation - watch for swelling in feet, ankles and legs every day - weigh myself daily    Why is this important?   It is important to check your weight daily and watch how much salt and liquids you have.  It will help you to manage your heart failure       The patient verbalizes understanding of the information and instructions discussed today.  Our next appointment is scheduled for  04/23/21.   Please feel free to call me or the office if you have any questions or concerns.  Lazaro Arms RN, BSN, [facility] Care Management Coordinator [place] Phone: [phone] I Fax: [phone]

## 2021-03-26 NOTE — Chronic Care Management (AMB) (Signed)
Chronic Care Management   CCM RN Visit Note  03/26/2021 Name: Veronica Shaw MRN: [id number] DOB: [birth year]  Subjective: Veronica Shaw is a 70 y.o. year old female who is a primary care patient of Lyndee Hensen, DO. The care management team was consulted for assistance with disease management and care coordination needs.    Engaged with patient by telephone for follow up visit in response to provider referral for case management and/or care coordination services.   Consent to Services:  The patient was given the following information about Chronic Care Management services today, agreed to services, and gave verbal consent: 1. CCM service includes personalized support from designated clinical staff supervised by the primary care provider, including individualized plan of care and coordination with other care providers 2. 24/7 contact phone numbers for assistance for urgent and routine care needs. 3. Service will only be billed when office clinical staff spend 20 minutes or more in a month to coordinate care. 4. Only one practitioner may furnish and bill the service in a calendar month. 5.The patient may stop CCM services at any time (effective at the end of the month) by phone call to the office staff. 6. The patient will be responsible for cost sharing (co-pay) of up to 20% of the service fee (after annual deductible is met). Patient agreed to services and consent obtained.  Patient agreed to services and verbal consent obtained.    Assessment:  The patient continues to maintain positive progress with care plan goals.. See Care Plan below for interventions and patient self-care actives. Follow up Plan: Patient would like continued follow-up.  CCM RNCM will outreach the patient within the next $ weeks  Patient will call office if needed prior to next encounter  Review of patient past medical history, allergies, medications, health status, including review of consultants reports,  laboratory and other test data, was performed as part of comprehensive evaluation and provision of chronic care management services.   SDOH (Social Determinants of Health) assessments and interventions performed:  No  CCM Care Plan  Allergies  Allergen Reactions   Aspirin Swelling and Other (See Comments)    Other reaction(s): Other (See Comments) Causes nose bleeds *ONLY THE COATED ASA* Causes nose bleeds *ONLY THE COATED ASA*   Penicillins Shortness Of Breath and Rash    Shortness of Breath - Throat felt like it was closing.    Prempro [Conj Estrog-Medroxyprogest Ace] Shortness Of Breath    Throat swelling Throat swelling   Ace Inhibitors Cough   Simvastatin Other (See Comments) and Rash    Muscle aches    Outpatient Encounter Medications as of 03/26/2021  Medication Sig   acetaminophen (TYLENOL) 500 MG tablet Take 2 tablets (1,000 mg total) by mouth every 8 (eight) hours as needed.   APPLE CIDER VINEGAR PO Take 1 capsule by mouth daily.   Biotin 5000 MCG CAPS Take 1 tablet by mouth daily.    budesonide-formoterol (SYMBICORT) 160-4.5 MCG/ACT inhaler Inhale 2 puffs into the lungs 2 (two) times daily.   carbidopa-levodopa (SINEMET IR) 25-100 MG tablet Take 1 tablet by mouth 3 (three) times daily.   diclofenac Sodium (VOLTAREN) 1 % GEL Apply 4 g topically 4 (four) times daily.   famotidine (PEPCID) 20 MG tablet TAKE 1 TABLET TWICE DAILY   fluconazole (DIFLUCAN) 150 MG tablet Take 1 tablet (150 mg total) by mouth every 3 (three) days. Take one tablet, if still symptoms of vaginal itching, take 2nd in 72 hours   fluticasone (  FLONASE SENSIMIST) 27.5 MCG/SPRAY nasal spray Place 2 sprays into the nose daily.   furosemide (LASIX) 40 MG tablet Take 1 tablet (40 mg total) by mouth daily.   levalbuterol (XOPENEX) 0.63 MG/3ML nebulizer solution USE 1 VIAL IN NEBULIZER EVERY 4 TO 6 HOURS AS NEEDED FOR WHEEZING AND FOR SHORTNESS OF BREATH   losartan (COZAAR) 25 MG tablet TAKE 1 TABLET EVERY  DAY   meclizine (ANTIVERT) 12.5 MG tablet Take 1 tablet (12.5 mg total) by mouth daily as needed for dizziness. Should not be long term.   melatonin 5 MG TABS Take 5 mg by mouth at bedtime.   metFORMIN (GLUCOPHAGE) 500 MG tablet Take 2 tablets (1,000 mg total) by mouth 2 (two) times daily with a meal.   metoprolol succinate (TOPROL-XL) 50 MG 24 hr tablet Take 1 tablet (50 mg total) by mouth 2 (two) times daily. Please make yearly appt with Dr. Lovena Le for July 2022 for future refills. Thank you 1st attempt   OXYGEN Inhale into the lungs at bedtime. Inhale 2L into lungs   potassium chloride (KLOR-CON) 10 MEQ tablet Take 10 mEq by mouth daily as needed (low potassium).   rosuvastatin (CRESTOR) 10 MG tablet Take 1 tablet (10 mg total) by mouth 2 (two) times a week.   spironolactone (ALDACTONE) 25 MG tablet Take 0.5 tablets (12.5 mg total) by mouth daily.   tiZANidine (ZANAFLEX) 2 MG tablet TAKE 1 TABLET BY MOUTH EVERY 8 HOURS AS NEEDED FOR MUSCLE SPASM   Ubiquinone (ULTRA COQ10 PO) Take 1 tablet by mouth 3 (three) times a week.   No facility-administered encounter medications on file as of 03/26/2021.    Patient Active Problem List   Diagnosis Date Noted   UTI (urinary tract infection) 01/24/2021   Acute on chronic systolic CHF (congestive heart failure), NYHA class 4 ([name]) 10/19/2020   Chronic HFrEF (heart failure with reduced ejection fraction) ([name])    Cervical dystonia 10/07/2020   Advanced care planning/counseling discussion 08/21/2020   Dementia without behavioral disturbance ([name]) 08/21/2020   Vision impairment, Right Eye 08/21/2020   Chronic kidney disease, stage 3a ([name]) 08/18/2020   Frequent urination 07/30/2020   Body mass index (BMI) 26.0-26.9, adult 01/29/2020   Chronic rhinitis 12/18/2019   Chronic respiratory failure with hypoxia ([name]) 12/18/2019   Hyperlipidemia associated with type 2 diabetes mellitus ([name]) 03/05/2019   Cervical radiculopathy 10/24/2018   Falls frequently     Asthma 08/03/2017   Osteopenia 07/10/2015   Mild persistent asthma in adult without complication 27/25/3664   NICM (nonischemic cardiomyopathy) ([name]) 04/23/2014   Arthritis or polyarthritis, rheumatoid ([place]) 03/12/2014   ICD (implantable cardioverter-defibrillator), dual, in situ 03/06/2014   Heart Failure with Recovered Ejection Fraction, G2DD 05/31/2013   Parkinson disease ([place]) 05/21/2013   Vertigo 03/03/2013   Gastroesophageal reflux disease without esophagitis 09/27/2012   Trigger point with neck pain 04/12/2012   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   Diabetic peripheral neuropathy ([place]) 12/09/2011   Chronic urticaria 05/27/2011   Sinus tachycardia ([place]) 05/27/2011   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 05/20/2011   Type II diabetes mellitus with complication ([place]) 40/34/7425   Hypertension associated with diabetes ([place]) 09/28/2006    Conditions to be addressed/monitored:CHF  There are no care plans that you recently modified to display for this patient.  Patient Care Plan: RN Case Manager     Problem Identified: Symptom Exacerbation (Heart Failure)      Long-Range Goal: Patient to manage and monitor signs and symptoms of Heart Failure  Start Date: 11/03/2020  Expected End Date: 04/30/2021  Priority: High  Note:   Current Barriers:  Knowledge deficit related to basic heart failure pathophysiology and self care management  Case Manager Clinical Goal(S):  patient will verbalize understanding of Heart Failure Action Plan and when to call doctor  Interventions:  Basic overview and discussion of  Heart Failure reviewed Provided verbal education on low sodium diet Provided education on low sodium diet in My Chart Reviewed Heart Failure Action Plan  Advised patient to weigh each morning after emptying bladder Discussed importance of daily weight and advised patient to weigh and record daily-patient's weight today was 134 lbs. Reviewed role of diuretics in prevention of fluid  overload and management of heart failure-patient states that she is taking her medications.  She stated that she feels better about her fluid pill.  She states that she was told to decrease her furosemide to 20 mg by her Nephrologist and she is not having to go and urinate as much.. She said that she is also taking potassium and magnesium to help.  She denies any chest pain shortness of breath or swelling.   Patient Goals/Self Care Activities:  Takes Heart Failure Medications as prescribed Weighs daily and record (notifying MD of 3 lb weight gain over night or 5 lb in a week) Verbalizes understanding of and follows CHF Action Plan Adheres to low sodium diet        RN, BSN, CPC Care Management Coordinator [facility] Phone: [phone] I Fax: [phone]           

## 2021-03-31 ENCOUNTER — Ambulatory Visit: Payer: Medicare Other | Admitting: Physical Medicine and Rehabilitation

## 2021-04-07 NOTE — Progress Notes (Signed)
Assessment/Plan:   1.  Parkinsons Disease             -compliance has been an issue.  Take carbidopa/levodopa 25/100 at 7am/11am/4pm             -not sure what happened to the carbidopa/levodopa 50/200 q hs.  She isn't on it.  She doesn't want to take it. 2.  Memory change             -last neurcog testing via telemed in [place] in 2020 demonstrating anxiety/depression.             -we tried to repeat neurocog testing and she had been agreeable and refused when tried to schedule.  She did go to [facility] geriatric clinic and had MoCA blind (not a very good test) and MMSE and told she had mild dementia.  I would like to see more formal neurocog testing done.   Subjective:   Veronica Shaw was seen today in follow up for Parkinsons disease.  My previous records were reviewed prior to todays visit as well as outside records available to me.  I have not seen her in a year.  Last visit, I talked to the patient about making sure she was taking levodopa on a 3 times per day basis (prior mostly taking on a twice per day basis) and we added levodopa at bedtime because of cramping.  States that she told me last visit she stopped it but we just started it last visit.  Last visit, she was agreeable to repeating her neurocognitive testing, but then declined scheduling it.   However, her PCP referred her to the geriatric clinic for memory change.  she did end up going to [facility] geriatrics clinic.  They did a MoCA blind and MMSE and was advised she had dementia.  She had a fall but states that it was due to her knee giving out.  Told she needed knee surgery but cardiology didn't recommend it but awaiting stress testing to see if she could tolerate sx.  States having chronic pain and told "I have sciatic from my leg to my neck."  Is having neck pain but neck injections are helping some.  Current prescribed movement disorder medications: Carbidopa/levodopa 25/100, 1 tablet 3 times per day (takes it   Carbidopa/levodopa 50/200 at bedtime (added last visit)   PREVIOUS MEDICATIONS:  Mirtazapine given but never taken per pt; carbidopa/levodopa 50/200 (given but ? Taken)  ALLERGIES:   Allergies  Allergen Reactions   Aspirin Swelling and Other (See Comments)    Other reaction(s): Other (See Comments) Causes nose bleeds *ONLY THE COATED ASA* Causes nose bleeds *ONLY THE COATED ASA*   Penicillins Shortness Of Breath and Rash    Shortness of Breath - Throat felt like it was closing.    Prempro [Conj Estrog-Medroxyprogest Ace] Shortness Of Breath    Throat swelling Throat swelling   Ace Inhibitors Cough   Simvastatin Other (See Comments) and Rash    Muscle aches    CURRENT MEDICATIONS:  Outpatient Encounter Medications as of 04/09/2021  Medication Sig   acetaminophen (TYLENOL) 500 MG tablet Take 2 tablets (1,000 mg total) by mouth every 8 (eight) hours as needed.   APPLE CIDER VINEGAR PO Take 1 capsule by mouth daily.   Biotin 5000 MCG CAPS Take 1 tablet by mouth daily.    budesonide-formoterol (SYMBICORT) 160-4.5 MCG/ACT inhaler Inhale 2 puffs into the lungs 2 (two) times daily.   carbidopa-levodopa (SINEMET IR) 25-100  MG tablet Take 1 tablet by mouth 3 (three) times daily.   diclofenac Sodium (VOLTAREN) 1 % GEL Apply 4 g topically 4 (four) times daily.   famotidine (PEPCID) 20 MG tablet TAKE 1 TABLET TWICE DAILY   fluconazole (DIFLUCAN) 150 MG tablet Take 1 tablet (150 mg total) by mouth every 3 (three) days. Take one tablet, if still symptoms of vaginal itching, take 2nd in 72 hours   fluticasone (FLONASE SENSIMIST) 27.5 MCG/SPRAY nasal spray Place 2 sprays into the nose daily.   furosemide (LASIX) 40 MG tablet Take 1 tablet (40 mg total) by mouth daily. (Patient taking differently: Take 20 mg by mouth daily.)   levalbuterol (XOPENEX) 0.63 MG/3ML nebulizer solution USE 1 VIAL IN NEBULIZER EVERY 4 TO 6 HOURS AS NEEDED FOR WHEEZING AND FOR SHORTNESS OF BREATH   losartan (COZAAR) 25  MG tablet TAKE 1 TABLET EVERY DAY   meclizine (ANTIVERT) 12.5 MG tablet Take 1 tablet (12.5 mg total) by mouth daily as needed for dizziness. Should not be long term.   melatonin 5 MG TABS Take 5 mg by mouth at bedtime.   metFORMIN (GLUCOPHAGE) 500 MG tablet Take 2 tablets (1,000 mg total) by mouth 2 (two) times daily with a meal.   metoprolol succinate (TOPROL-XL) 50 MG 24 hr tablet Take 1 tablet (50 mg total) by mouth 2 (two) times daily. Please make yearly appt with Dr. Lovena Le for July 2022 for future refills. Thank you 1st attempt   OXYGEN Inhale into the lungs at bedtime. Inhale 2L into lungs   potassium chloride (KLOR-CON) 10 MEQ tablet Take 10 mEq by mouth daily as needed (low potassium).   rosuvastatin (CRESTOR) 10 MG tablet Take 1 tablet (10 mg total) by mouth 2 (two) times a week.   spironolactone (ALDACTONE) 25 MG tablet Take 0.5 tablets (12.5 mg total) by mouth daily.   tiZANidine (ZANAFLEX) 2 MG tablet TAKE 1 TABLET BY MOUTH EVERY 8 HOURS AS NEEDED FOR MUSCLE SPASM   Ubiquinone (ULTRA COQ10 PO) Take 1 tablet by mouth 3 (three) times a week.   No facility-administered encounter medications on file as of 04/09/2021.    Objective:   PHYSICAL EXAMINATION:    VITALS:   Vitals:   04/09/21 0827  BP: 122/69  Pulse: 71  SpO2: 97%  Weight: 135 lb 9.6 oz (61.5 kg)  Height: '5\' 1"'$  (1.549 m)    GEN:  The patient appears stated age and is in NAD. HEENT:  Normocephalic, atraumatic.  The mucous membranes are moist. The superficial temporal arteries are without ropiness or tenderness. CV:  RRR Lungs:  CTAB Neck/HEME:  There are no carotid bruits bilaterally.  Neurological examination:  Orientation: The patient is alert and oriented x3. Cranial nerves: There is good facial symmetry with mild facial hypomimia. The speech is fluent and clear. Soft palate rises symmetrically and there is no tongue deviation. Hearing is intact to conversational tone. Sensation: Sensation is intact to light  touch throughout Motor: Strength is at least antigravity x4.  Movement examination: Tone: There is mild increased tone in the bilateral upper extremities Abnormal movements: None Coordination:  There is minimal decremation with RAM's Gait and Station: The patient has no difficulty arising out of a deep-seated chair without the use of the hands. The patient's stride length is decreased, and she is off balance even with her cane.    I have reviewed and interpreted the following labs independently    Chemistry      Component Value Date/Time  NA 144 01/22/2021 0951   K 4.6 01/22/2021 0951   CL 103 01/22/2021 0951   CO2 21 01/22/2021 0951   BUN 29 (H) 01/22/2021 0951   CREATININE 1.50 (H) 01/22/2021 0951   CREATININE 1.44 (H) 01/30/2019 0941      Component Value Date/Time   CALCIUM 9.8 01/22/2021 0951   ALKPHOS 99 01/18/2021 1052   AST 20 01/18/2021 1052   ALT 16 01/18/2021 1052   BILITOT 1.0 01/18/2021 1052   BILITOT <0.2 02/04/2020 1722       Lab Results  Component Value Date   WBC 10.8 (H) 01/18/2021   HGB 12.7 01/18/2021   HCT 38.6 01/18/2021   MCV 92.8 01/18/2021   PLT 239 01/18/2021    Lab Results  Component Value Date   TSH 2.143 10/17/2020     Total time spent on today's visit was 21 minutes, including both face-to-face time and nonface-to-face time.  Time included that spent on review of records (prior notes available to me/labs/imaging if pertinent), discussing treatment and goals, answering patient's questions and coordinating care.  Cc:  Lyndee Hensen, DO

## 2021-04-09 ENCOUNTER — Ambulatory Visit (INDEPENDENT_AMBULATORY_CARE_PROVIDER_SITE_OTHER): Payer: Medicare Other | Admitting: Neurology

## 2021-04-09 ENCOUNTER — Encounter: Payer: Self-pay | Admitting: Neurology

## 2021-04-09 ENCOUNTER — Other Ambulatory Visit: Payer: Self-pay

## 2021-04-09 VITALS — BP 122/69 | HR 71 | Ht 61.0 in | Wt 135.6 lb

## 2021-04-09 DIAGNOSIS — G20 Parkinson's disease: Secondary | ICD-10-CM

## 2021-04-09 MED ORDER — CARBIDOPA-LEVODOPA 25-100 MG PO TABS: 1.0000 | ORAL_TABLET | Freq: Three times a day (TID) | ORAL | 1 refills | Status: DC

## 2021-04-09 NOTE — Patient Instructions (Signed)
Online Resources for Power over Parkinson's Group September 2022  Local [place] Online Groups  Power over [employer] Group :   Power Over Parkinson's Patient Education Group will be Wednesday, September 14th-*Hybrid meting*- in person at [facility] location and via [facility] at 2:00 pm.   Upcoming Power over [employer] Meetings:  2nd Wednesdays of the month at 2 pm:  August 10th, September 14th [place] at [email or web address] if interested in participating in this online group Parkinson's Care Partners Group:    3rd Mondays, Contact Misty Paladino Atypical Parkinsonian Patient Group:   4th Wednesdays, Contact Misty Paladino If you are interested in participating in these online groups with Misty, please contact her directly for how to join those meetings.  Her contact information is [email or web address].   [place]:  [email or web address] PD Health at Home continues:  Mindfulness Mondays, Expert Briefing Tuesdays, Wellness Wednesdays, Take Time Thursdays, Fitness Fridays -Listings for June 2022 are on the website Upcoming Webinar:  Use it or Lose It-The Impact of Physical Activity in Parkinson's.  Wednesday, September 7th at 1 pm. Upcoming Webinar:  Understanding Gene and Cell-Based Therapies in Parkinson's.  Wednesday, October 5th at 1 pm Register for expert briefings (webinars) at [email or web address]  Please check out their website to sign up for emails and see their full online offerings  [place]:  [email or web address]  Upcoming Webinar:   Finding your Way:  Working through Emotions in the Early Years with Parkinson's.  Thursday, September 15th at 12 noon Check out additional information on their website to see their full online offerings  University:  [email or web address] Upcoming Webinar:  Pelvic Health and Living with  Parkinson's.  Tuesday, September 20th at 3 pm.  Care Partner Monthly Meetup.  With Robin Searing Phinney.  First Tuesday of each month, 2 pm Joy Breaks:  First Wednesday of each month, 2-3 pm. There will be art, doodling, making, crafting, listening, laughing, stories, and everything in between. No art experience necessary. No supplies required. Just show up for joy!  Register on their website. Check out additional information to Live Well Today on their website  Parkinson and Movement Disorders (PMD) Alliance:  www.pmdalliance.org NeuroLife Online:  Online Education Events Sign up for emails, which are sent weekly to give you updates on programming and online offerings  Parkinson's Association of the [place]:  www.[email or web address] Caring for Parkinson's, Caring for You Symposium, Saturday, September 10th.  [place], [place].  Symposium Registration - Parkinson Association of the [place] Information on online support groups, education events, and online exercises including Yoga, Parkinson's exercises and more-LOTS of information on links to PD resources and online events Virtual Support Group through Parkinson's Association of the [place]; next one is scheduled for Wednesday, October 5th at 2 pm. No September support group due to symposium.  (These are typically scheduled for the 1st Wednesday of the month at 2 pm).  Visit website for details.  Additional links for movement activities: Parkinson's DRUMMING Classes/Music Therapy with Doylene Canning:  This is a returning class!  2nd Mondays, beginning September 12th.  Contact Doylene Canning at [phone] or [email or web address] PWR! Moves Classes at [place] RESUMED!  Wednesdays 10 and 11 am.  Contact Amy Marriott, PT [email or web address] or [phone] if interested Here is a link to the PWR!Moves classes on Zoom from [place] - Daily Mon-Sat at 10:00. Via Zoom, FREE and open  to all.  There is also a link below via Facebook if you use that platform. [email or web address]  [email or web address] Parkinson's Wellness Recovery (PWR! Moves)  www.[email or web address] Info on the PWR! Virtual Experience:  You will have access to our expertise through self-assessment, guided plans that start with the PD-specific fundamentals, educational content, tips, Q&A with an expert, and a growing Art therapist of PD-specific pre-recorded and live exercise classes of varying types and intensity - both physical and cognitive! If that is not enough, we offer 1:1 wellness consultations (in-person or virtual) to personalize your PWR! Research scientist (medical).  [place] Fridays:  As part of the PD Health @ Home program, this free video series focuses each week on one aspect of fitness designed to support people living with Parkinson's.  These weekly videos highlight the [place] recent fitness guidelines for people with Parkinson's disease.  HollywoodSale.dk Dance for PD website is offering free, live-stream classes throughout the week, as well as links to [employer] of classes:  [email or web address]/ Dance for Parkinson's Class:  [place].  Free offering for people with Parkinson's and care partners; virtual class.  For more information, contact [phone] or email Ruffin Frederick at [email or web address] Virtual dance and Pilates for Parkinson's classes: Click on the Community Tab> Parkinson's Movement Initiative Tab.  To register for classes and for more information, visit www.[email or web address].co.[email or web address] and click the "community" tab.  [employer] Parkinson's Cycling Classes  [employer]: 1pm on Fridays-Live  classes at [employer] ([employer] at [email or web address] or [phone]) [employer]: Virtual Classes Mondays and Thursdays Jeanette Caprice classes Tuesday, Wednesday and Thursday (contact [place] at [email or web address]  or [phone])  [facility] Boxing Three levels of classes are offered Tuesdays and Thursdays:  10:30 am,  12 noon & 1:45 pm at [facility].  Active Stretching with Paula Compton Class starting in March, on Fridays To observe a class or for  more information, call [phone] or email [email or web address] Well[employer]: Online Caregiver Education Opportunities:  www.well-[email or web address]/caregiver-education/caregiver-support-group.  You may also contact Vickki Muff at [email or web address] or [phone].   Powerful Tools for Caregivers:  6-week program beginning Thursday, October 13th.  This six-week educational series designed to provide family caregivers with practical tools to care for themselves while caring for a loved one Unlocking Dementia through [facility], Humor, and Understanding:  5-week series beginning September 7th Well-Spring Navigator:  10-[birth year]-1999 program, a free service to help individuals and families through the journey of determining care for older adults.  The "Navigator" is a [employer], Education officer, museum, who will speak with a prospective client and/or loved ones to provide an assessment of the situation and a set of recommendations for a personalized care plan -- all free of charge, and whether [employer] offers the needed service or not. If the need is not a service we provide, we are well-connected with reputable programs in town that we can refer you to.  www.well-springsolutions.org or to speak with the Navigator, call [phone].

## 2021-04-20 ENCOUNTER — Other Ambulatory Visit: Payer: Self-pay

## 2021-04-20 ENCOUNTER — Other Ambulatory Visit: Payer: Medicare Other

## 2021-04-20 ENCOUNTER — Telehealth: Payer: Self-pay | Admitting: Cardiovascular Disease

## 2021-04-20 DIAGNOSIS — I5022 Chronic systolic (congestive) heart failure: Secondary | ICD-10-CM | POA: Diagnosis not present

## 2021-04-20 DIAGNOSIS — I5020 Unspecified systolic (congestive) heart failure: Secondary | ICD-10-CM

## 2021-04-20 DIAGNOSIS — R252 Cramp and spasm: Secondary | ICD-10-CM

## 2021-04-20 NOTE — Telephone Encounter (Signed)
Spoke with patient. Attempted to assist with sending manual remote transmission and unable to transmit.  Monitor is not currently working.  Provided Carelink Support number for assistance and encouraged her to call today if possible.

## 2021-04-20 NOTE — Progress Notes (Signed)
Cardiology Office Note:    Date:  04/21/2021   ID:  Veronica Shaw, DOB 1950-12-28, MRN [id number]  PCP:  Veronica Shaw, [place] Providers Cardiologist:  Veronica Mocha, MD Cardiology APP:  Veronica Shi, PA-C  Electrophysiologist:  Veronica Peru, MD     Referring MD: Veronica Hensen, DO   Chief Complaint:  F/u for CHF; Pt has sore throat    Patient Profile:   Veronica Shaw is a 70 y.o. female with:  HFmrEF (heart failure with mildly reduced ejection fraction)  Non-ischemic cardiomyopathy Cath 2014: no sig CAD CMR 4/15: EF 34 Echocardiogram 5/17: EF 20-25 Echocardiogram 6/18: EF 30-35 Echocardiogram 3/19: EF 60-65, Gr 2 DD Echo 09/2020: EF 40-45 S/p AICD Diabetes mellitus Hyperlipidemia OSA Asthma Parkinson's Disease Syncope 09/2017 Cervical spine DDD   Prior CV studies: Echocardiogram 10/18/2020 EF 40-45, global HK, normal RVSF, RVSP 22   Echo 10/18/17 EF 60-65, normal wall motion, grade 2 diastolic dysfunction   Echo 01/11/17 EF 30-35, diffuse HK, grade 1 diastolic dysfunction, trivial MR, mild LAE, mild TR   Nuclear stress test 12/29/16 Large size, moderate intensity fixed inferoseptal/septal perfusion defect consistent with LBBB artifact. No reversible ischemia. LVEF 49% with incoordinate septal motion. This is an intermediate risk study (due to LVEF <50).   RHC 12/16/15 CO 2.9; mean RA 1, PASP 15, mean PA 10, mean PCWP 2 1. Low intracardiac filling pressures 2. Preserved cardiac output   Echo 12/11/15 EF 20-25%, severe diffuse HK, marked systolic dyssynchrony, grade 1 diastolic dysfunction, mild MR   Cardiac MRI (4/15):   1. Mild LVE, EF 34%, Diff HK, paradoxical septal motion c/w LBBB, small focal area of late gadolinium enhancement at basal anteroseptum - may represent RV volume overload or a focal infiltrative disease such as sarcoidosis, but certainly wouldn't explain the degree of of LV dysfunction. - c/w NICM, normal RV size and  function (RVEF 61%), Mild MR   LHC (10/14):   Minor luminal irregularity in prox LAD, EF 35%   Echo (2/15):   EF 30-35%, diff HK, ant-sept AK, Gr 2 DD, mild MR, trivial TR   Nuclear (5/13):   Normal stress nuclear study. LV Ejection Fraction: 58%   History of Present Illness: [name] was last seen in 7/22.  She returns for f/u.  Of note, she called in yesterday with symptoms of not feeling well, lightheadedness, shortness of breath and elevated HR.  We tried to get a remote transmission on her device to get a thoracic impedance.  But, the transmission was not successful.  We had her come in for blood work.  Her NT proBNP is normal.  Hemoglobin, potassium, ALT, TSH are also normal.  Her creatinine is stable at 1.24.  She notes starting to feel better over the weekend.  She has a sore throat.  She has had a mild cough with clear sputum production.  She feels diffuse body aches.  She has been a little bit more short of breath.  She is now sleeping on pillows.  Her weight is overall stable.  She has not had syncope but has been dizzy.  She has not had leg edema.    Past Medical History:  Diagnosis Date   Acute on chronic systolic CHF (congestive heart failure), NYHA class 4 (HCC) 10/19/2020   Asthma    Back pain 09/18/2019   Cervical disc disorder with radiculopathy of cervical region 0/62/6948   Chronic systolic CHF (congestive heart failure) ([place])  a. cMRI 4/15: EF 34% and findings - c/w NICM, normal RV size and function (RVEF 61%), Mild MR // b. Echo 2/15:  EF 30-35%, diff HK, ant-sept AK, Gr 2 DD, mild MR, trivial TR  //  c. Echo 5/17: EF 20-25%, severe diffuse HK, marked systolic dyssynchrony, grade 1 diastolic dysfunction, mild MR  //  d. [place] 5/17: Fick CO 2.9, RVSP 19, PASP 15, PW mean 2, low filing pressures and preserved CO    Cognitive impairment 10/19/2017   MOCA was administered with a score of 23/30   Diabetes mellitus    Diabetic peripheral neuropathy ([place]) 12/09/2011    Dyspnea 09/19/2012   Flu 10/17/2017   Gastritis    History of echocardiogram    Echo 6/18: EF 30-35, diffuse HK, grade 1 diastolic dysfunction, trivial MR, mild LAE, mild TR, no pericardial effusion   History of nuclear stress test    Myoview 5/18: EF 49, no ischemia, inferoseptal defect c/w LBBB artifact (intermediate risk due to EF < 50).   HTN (hypertension)    Hyperlipidemia    Hyperlipidemia associated with type 2 diabetes mellitus ([place]) 03/05/2019   Hypertension associated with diabetes ([place]) 09/28/2006   Qualifier: Diagnosis of  By: [name]     Major depression 03/31/2013   Melena 08/21/2020   NICM (nonischemic cardiomyopathy) ([place])    a. Nuclear 5/13: Normal stress nuclear study. LV Ejection Fraction: 58%  //  b. LHC 10/14: Minor luminal irregularity in prox LAD, EF 35%    Parkinson disease (HCC)    Plantar fasciitis    Plantar fasciitis, bilateral 06/15/2012   Rosacea 05/29/2009   Qualifier: Diagnosis of  By: [name] MD, [name] hearing loss (SNHL), bilateral 01/04/2017   Sleep apnea    was retested and no longer had it and so d/c CPAP   Syncope    Thoracic or lumbosacral neuritis or radiculitis, unspecified 07/03/2013   Tinnitus, bilateral 01/04/2017   Type II diabetes mellitus with complication ([place]) 03/11/9146   Diabetic eye exam done by [facility]: Dr. Prudencio Burly on 11/08/13: no diabetic retinopathy. Repeat in 1 year     Urticaria    Current Medications: Current Meds  Medication Sig   acetaminophen (TYLENOL) 500 MG tablet Take 2 tablets (1,000 mg total) by mouth every 8 (eight) hours as needed.   APPLE CIDER VINEGAR PO Take 1 capsule by mouth daily.   Biotin 5000 MCG CAPS Take 1 tablet by mouth daily.    budesonide-formoterol (SYMBICORT) 160-4.5 MCG/ACT inhaler Inhale 2 puffs into the lungs 2 (two) times daily.   carbidopa-levodopa (SINEMET IR) 25-100 MG tablet Take 1 tablet by mouth 3 (three) times daily.   diclofenac Sodium (VOLTAREN) 1 % GEL  Apply 4 g topically 4 (four) times daily.   famotidine (PEPCID) 20 MG tablet TAKE 1 TABLET TWICE DAILY   fluconazole (DIFLUCAN) 150 MG tablet Take 1 tablet (150 mg total) by mouth every 3 (three) days. Take one tablet, if still symptoms of vaginal itching, take 2nd in 72 hours   fluticasone (FLONASE SENSIMIST) 27.5 MCG/SPRAY nasal spray Place 2 sprays into the nose daily.   furosemide (LASIX) 20 MG tablet Take 20 mg by mouth daily.   levalbuterol (XOPENEX) 0.63 MG/3ML nebulizer solution USE 1 VIAL IN NEBULIZER EVERY 4 TO 6 HOURS AS NEEDED FOR WHEEZING AND FOR SHORTNESS OF BREATH   losartan (COZAAR) 25 MG tablet TAKE 1 TABLET EVERY DAY   meclizine (ANTIVERT) 12.5 MG tablet Take 1  tablet (12.5 mg total) by mouth daily as needed for dizziness. Should not be long term.   melatonin 5 MG TABS Take 5 mg by mouth at bedtime.   metFORMIN (GLUCOPHAGE) 500 MG tablet Take 2 tablets (1,000 mg total) by mouth 2 (two) times daily with a meal.   metoprolol succinate (TOPROL-XL) 50 MG 24 hr tablet Take 1 tablet (50 mg total) by mouth 2 (two) times daily. Please make yearly appt with Dr. Lovena Le for July 2022 for future refills. Thank you 1st attempt   OXYGEN Inhale into the lungs at bedtime. Inhale 2L into lungs   potassium chloride (KLOR-CON) 10 MEQ tablet Take 10 mEq by mouth daily as needed (low potassium).   rosuvastatin (CRESTOR) 10 MG tablet Take 1 tablet (10 mg total) by mouth 2 (two) times a week.   spironolactone (ALDACTONE) 25 MG tablet Take 0.5 tablets (12.5 mg total) by mouth daily.   tiZANidine (ZANAFLEX) 2 MG tablet TAKE 1 TABLET BY MOUTH EVERY 8 HOURS AS NEEDED FOR MUSCLE SPASM   Ubiquinone (ULTRA COQ10 PO) Take 1 tablet by mouth 3 (three) times a week.    Allergies:   Aspirin, Penicillins, Prempro [conj estrog-medroxyprogest ace], Ace inhibitors, and Simvastatin   Social History   Tobacco Use   Smoking status: Never   Smokeless tobacco: Never   Tobacco comments:    exposed to smoke during  child hood (parents)  Vaping Use   Vaping Use: Never used  Substance Use Topics   Alcohol use: No    Alcohol/week: 0.0 standard drinks   Drug use: No    Family Hx: The patient's family history includes Coronary artery disease in her father and mother; Diabetes in her father, mother, sister, sister, and sister; Diabetes (age of onset: 56) in her son; Healthy in her daughter; Heart attack in her father and mother; Heart disease in her sister, sister, and sister; Hepatitis C in her sister; Parkinson's disease in her sister. There is no history of Breast cancer.  Review of Systems  Constitutional: Negative for fever.  HENT:  Positive for congestion and sore throat.   Respiratory:  Positive for cough and wheezing.   Gastrointestinal:  Negative for hematochezia and melena.  Genitourinary:  Negative for hematuria.    EKGs/Labs/Other Test Reviewed:    EKG:  EKG is   ordered today.  The ekg ordered today demonstrates NSR, HR 92, LBBB  Recent Labs: 10/17/2020: B Natriuretic Peptide 20.6; Magnesium 1.8 04/20/2021: ALT 9; BUN 16; Creatinine, Ser 1.24; Hemoglobin 11.9; NT-Pro BNP 262; Platelets 192; Potassium 4.6; Sodium 140; TSH 1.480   Recent Lipid Panel Lab Results  Component Value Date/Time   CHOL 199 01/22/2021 09:51 AM   TRIG 123 01/22/2021 09:51 AM   HDL 48 01/22/2021 09:51 AM   LDLCALC 129 (H) 01/22/2021 09:51 AM   LDLDIRECT 111 (H) 02/10/2015 11:45 AM    Risk Assessment/Calculations:          Physical Exam:    VS:  BP (!) 100/50   Pulse 100   Ht 5\' 1"  (1.549 m)   Wt 133 lb 6.4 oz (60.5 kg)   LMP 08/01/2000 (Approximate)   SpO2 95%   BMI 25.21 kg/m     Wt Readings from Last 3 Encounters:  04/21/21 133 lb 6.4 oz (60.5 kg)  04/09/21 135 lb 9.6 oz (61.5 kg)  02/17/21 134 lb 6.4 oz (61 kg)    Constitutional:      Appearance: Healthy appearance. Not in distress.  HENT:  Mouth/Throat:     Pharynx: Abnormal (some redness noted but no exudate; tonsils normal).  Neck:      Thyroid: No thyromegaly.     Vascular: JVD normal.  Pulmonary:     Effort: Pulmonary effort is normal.     Breath sounds: No wheezing. No rales.  Cardiovascular:     Normal rate. Regular rhythm. Normal S1. Normal S2.      Murmurs: There is no murmur.  Edema:    Peripheral edema absent.  Abdominal:     Palpations: Abdomen is soft. There is no hepatomegaly.  Skin:    General: Skin is warm and dry.  Neurological:     Mental Status: Alert and oriented to person, place and time.     Cranial Nerves: Cranial nerves are intact.       ASSESSMENT & PLAN:   1. 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) EF 40-45 by echocardiogram in 3/22.  She is NYHA IIb-III.  Her volume status appears stable on exam.  Her NT proBNP yesterday was normal.  Her weights are stable.  I suspect her symptoms are related to her current viral illness.  As noted below, she can hold her furosemide for 2 to 3 days to avoid dehydration while she is sick.  Her heart rate has run high in the past and is 92 electrocardiogram today.  She is in sinus rhythm.  Her heart rate is likely elevated more today due to her current viral illness.  Continue losartan 25 mg daily, metoprolol succinate 50 mg twice daily, spironolactone 12.5 mg daily.  Follow-up with Dr. Burt Knack or me in 4 months.  2. Stage 3a chronic kidney disease (HCC) Creatinine obtained yesterday is overall stable.  She is followed by nephrology.  3. ICD (implantable cardioverter-defibrillator), dual, in situ Follow-up with EP as planned.  4. Pharyngitis, unspecified etiology She likely has a viral illness.  Unfortunately, we do not have rapid strep test or swabs for throat cultures in our office.  We did contact her PCP and they cannot fit her in for a sick visit today.  She would have to go to urgent care.  I have encouraged her to go for further evaluation.  She can hold her furosemide for 2 to 3 days to avoid dehydration.  She knows to monitor her  weight.    Dispo:  Return in about 4 months (around 08/21/2021) for Routine Follow Up, w/ Dr. Burt Knack, or Richardson Dopp, PA-C.   Medication Adjustments/Labs and Tests Ordered: Current medicines are reviewed at length with the patient today.  Concerns regarding medicines are outlined above.  Tests Ordered: Orders Placed This Encounter  Procedures   EKG 12-Lead   Medication Changes: No orders of the defined types were placed in this encounter.  Signed, Richardson Dopp, PA-C  04/21/2021 12:35 PM    [facility] [address], [place], [place]  [address] Phone: [phone]; Fax: [phone]

## 2021-04-20 NOTE — Telephone Encounter (Signed)
Pt reports feeling bad for last two days,   sugar is high - 279 this am. Leg cramps at night Dizzy,lightheadhead  109/53 108, feels irregular, SOB - "yes but I have asthma"   Denies nausea, vomiting, diarrhea, taking all meds like normal.   Already scheduled tomorrow with APP for a 2 mo follow up.  Pt aware I will check w Nicki Reaper to see if any labs could be done today and will call patient back.

## 2021-04-20 NOTE — Telephone Encounter (Signed)
Spoke with patient. Reviewed recommendations. She will come today for labs. Margarita Grizzle, [facility] nurse will contact pt to have her send a report for review of thoracic impedence. She will contact PCP re : diabetes.

## 2021-04-20 NOTE — Telephone Encounter (Signed)
See if [facility] can check her thoracic impedance. See if she can come in to the lab today and get a CMET, CBC, TSH, NT-Pro BNP. F/u with PCP regarding diabetes. Richardson Dopp, PA-C    04/20/2021 1:38 PM

## 2021-04-20 NOTE — Telephone Encounter (Signed)
STAT if patient feels like he/she is going to faint   Are you dizzy now? yes  Do you feel faint or have you passed out? no  Do you have any other symptoms? Lightheaded, can barely get out of bed, sugar is high, leg cramps   Have you checked your HR and BP (record if available)? Didn't write it down  Pt would like to come in today and have blood work done so that she is able to have her results back by her appt tomorrow

## 2021-04-21 ENCOUNTER — Encounter (HOSPITAL_COMMUNITY): Payer: Self-pay

## 2021-04-21 ENCOUNTER — Other Ambulatory Visit: Payer: Self-pay

## 2021-04-21 ENCOUNTER — Ambulatory Visit (HOSPITAL_COMMUNITY)
Admission: EM | Admit: 2021-04-21 | Discharge: 2021-04-21 | Disposition: A | Discharge status: Home / Self Care | Payer: Medicare Other | Attending: Emergency Medicine | Admitting: Emergency Medicine

## 2021-04-21 ENCOUNTER — Encounter: Payer: Self-pay | Admitting: Physician Assistant

## 2021-04-21 ENCOUNTER — Ambulatory Visit (INDEPENDENT_AMBULATORY_CARE_PROVIDER_SITE_OTHER): Payer: Medicare Other | Admitting: Physician Assistant

## 2021-04-21 VITALS — BP 100/50 | HR 100 | Ht 61.0 in | Wt 133.4 lb

## 2021-04-21 DIAGNOSIS — Z9981 Dependence on supplemental oxygen: Secondary | ICD-10-CM | POA: Insufficient documentation

## 2021-04-21 DIAGNOSIS — B349 Viral infection, unspecified: Secondary | ICD-10-CM | POA: Diagnosis not present

## 2021-04-21 DIAGNOSIS — Z7984 Long term (current) use of oral hypoglycemic drugs: Secondary | ICD-10-CM | POA: Insufficient documentation

## 2021-04-21 DIAGNOSIS — I5020 Unspecified systolic (congestive) heart failure: Secondary | ICD-10-CM | POA: Diagnosis not present

## 2021-04-21 DIAGNOSIS — J029 Acute pharyngitis, unspecified: Secondary | ICD-10-CM

## 2021-04-21 DIAGNOSIS — Z886 Allergy status to analgesic agent status: Secondary | ICD-10-CM | POA: Diagnosis not present

## 2021-04-21 DIAGNOSIS — U071 COVID-19: Secondary | ICD-10-CM | POA: Diagnosis not present

## 2021-04-21 DIAGNOSIS — Z880 Allergy status to penicillin: Secondary | ICD-10-CM | POA: Diagnosis not present

## 2021-04-21 DIAGNOSIS — Z7951 Long term (current) use of inhaled steroids: Secondary | ICD-10-CM | POA: Insufficient documentation

## 2021-04-21 DIAGNOSIS — Z79899 Other long term (current) drug therapy: Secondary | ICD-10-CM | POA: Insufficient documentation

## 2021-04-21 DIAGNOSIS — N1831 Chronic kidney disease, stage 3a: Secondary | ICD-10-CM | POA: Diagnosis not present

## 2021-04-21 DIAGNOSIS — Z95810 Presence of automatic (implantable) cardiac defibrillator: Secondary | ICD-10-CM | POA: Diagnosis not present

## 2021-04-21 LAB — COMPREHENSIVE METABOLIC PANEL
ALT: 9 IU/L (ref 0–32)
AST: 16 IU/L (ref 0–40)
Albumin/Globulin Ratio: 1.9 (ref 1.2–2.2)
Albumin: 4.4 g/dL (ref 3.7–4.7)
Alkaline Phosphatase: 101 IU/L (ref 44–121)
BUN/Creatinine Ratio: 13 (ref 12–28)
BUN: 16 mg/dL (ref 8–27)
Bilirubin Total: 0.3 mg/dL (ref 0.0–1.2)
CO2: 21 mmol/L (ref 20–29)
Calcium: 9.9 mg/dL (ref 8.7–10.3)
Chloride: 100 mmol/L (ref 96–106)
Creatinine, Ser: 1.24 mg/dL — ABNORMAL HIGH (ref 0.57–1.00)
Globulin, Total: 2.3 g/dL (ref 1.5–4.5)
Glucose: 142 mg/dL — ABNORMAL HIGH (ref 65–99)
Potassium: 4.6 mmol/L (ref 3.5–5.2)
Sodium: 140 mmol/L (ref 134–144)
Total Protein: 6.7 g/dL (ref 6.0–8.5)
eGFR: 46 mL/min/{1.73_m2} — ABNORMAL LOW (ref >59)

## 2021-04-21 LAB — CBC
Hematocrit: 34.4 % (ref 34.0–46.6)
Hemoglobin: 11.9 g/dL (ref 11.1–15.9)
MCH: 29.9 pg (ref 26.6–33.0)
MCHC: 34.6 g/dL (ref 31.5–35.7)
MCV: 86 fL (ref 79–97)
Platelets: 192 10*3/uL (ref 150–450)
RBC: 3.98 x10E6/uL (ref 3.77–5.28)
RDW: 11.8 % (ref 11.7–15.4)
WBC: 6.7 10*3/uL (ref 3.4–10.8)

## 2021-04-21 LAB — PRO B NATRIURETIC PEPTIDE: NT-Pro BNP: 262 pg/mL (ref 0–301)

## 2021-04-21 LAB — TSH: TSH: 1.48 u[IU]/mL (ref 0.450–4.500)

## 2021-04-21 LAB — POCT RAPID STREP A, ED / UC: Streptococcus, Group A Screen (Direct): NEGATIVE

## 2021-04-21 NOTE — Discharge Instructions (Signed)
We will contact you if your COVID test is positive.  Please quarantine while you wait for the results.  If your test is negative you may resume normal activities.  If your test is positive please continue to quarantine for at least 5 days from your symptom onset or until you are without a fever for at least 24 hours after the medications.  You can take Tylenol and/or Ibuprofen as needed for fever reduction and pain relief.   For cough: honey 1/2 to 1 teaspoon (you can dilute the honey in water or another fluid).  You can also use guaifenesin and dextromethorphan for cough. You can use a humidifier for chest congestion and cough.  If you don't have a humidifier, you can sit in the bathroom with the hot shower running.     For sore throat: try warm salt water gargles, cepacol lozenges, throat spray, warm tea or water with lemon/honey, popsicles or ice, or OTC cold relief medicine for throat discomfort.    For congestion: take a daily anti-histamine like Zyrtec, Claritin, and a oral decongestant, such as pseudoephedrine.  You can also use Flonase 1-2 sprays in each nostril daily.    It is important to stay hydrated: drink plenty of fluids (water, gatorade/powerade/pedialyte, juices, or teas) to keep your throat moisturized and help further relieve irritation/discomfort.   Return or go to the Emergency Department if symptoms worsen or do not improve in the next few days.  

## 2021-04-21 NOTE — Patient Instructions (Signed)
Medication Instructions:  You may hold your furosemide (Lasix) for 2-3 days then resume daily dose. Be sure to weigh yourself daily. *If you need a refill on your cardiac medications before your next appointment, please call your pharmacy*   Lab Work: None If you have labs (blood work) drawn today and your tests are completely normal, you will receive your results only by: [facility] (if you have MyChart) OR A paper copy in the mail If you have any lab test that is abnormal or we need to change your treatment, we will call you to review the results.   Testing/Procedures: None   Follow-Up: At [facility], you and your health needs are our priority.  As part of our continuing mission to provide you with exceptional heart care, we have created designated Provider Care Teams.  These Care Teams include your primary Cardiologist (physician) and Advanced Practice Providers (APPs -  Physician Assistants and Nurse Practitioners) who all work together to provide you with the care you need, when you need it.   Your next appointment:   4 month(s)  The format for your next appointment:   In Person  Provider:   You may see Sherren Mocha, MD or one of the following Advanced Practice Providers on your designated Care Team:   Richardson Dopp, [place]

## 2021-04-21 NOTE — Progress Notes (Signed)
No ICM remote transmission received for 04/19/2021 due to monitoring not working and next ICM transmission scheduled for 05/10/2021.

## 2021-04-21 NOTE — ED Provider Notes (Signed)
[place]    CSN: [id number] Arrival date & time: 04/21/21  1220      History   Chief Complaint Chief Complaint  Patient presents with   Sore Throat    HPI Veronica Shaw is a 70 y.o. female.   Patient here for evaluation of cough, sore throat, and body aches that have been ongoing since Saturday.  Denies any recent sick contacts.  Reports throat is painful to swallow.  Has not taken any OTC medication or treatment.  Reports feeling feverish but was unable to check her temperature at home as she does not have a thermometer.  Denies any trauma, injury, or other precipitating event.  Denies any specific alleviating or aggravating factors.  Denies any chest pain, shortness of breath, N/V/D, numbness, tingling, weakness, abdominal pain, or headaches.    The history is provided by the patient.  Sore Throat Pertinent negatives include no chest pain and no shortness of breath.   Past Medical History:  Diagnosis Date   Acute on chronic systolic CHF (congestive heart failure), NYHA class 4 (HCC) 10/19/2020   Asthma    Back pain 09/18/2019   Cervical disc disorder with radiculopathy of cervical region 2/62/0355   Chronic systolic CHF (congestive heart failure) ([place])    a. cMRI 4/15: EF 34% and findings - c/w NICM, normal RV size and function (RVEF 61%), Mild MR // b. Echo 2/15:  EF 30-35%, diff HK, ant-sept AK, Gr 2 DD, mild MR, trivial TR  //  c. Echo 5/17: EF 20-25%, severe diffuse HK, marked systolic dyssynchrony, grade 1 diastolic dysfunction, mild MR  //  d. [place] 5/17: Fick CO 2.9, RVSP 19, PASP 15, PW mean 2, low filing pressures and preserved CO    Cognitive impairment 10/19/2017   MOCA was administered with a score of 23/30   Diabetes mellitus    Diabetic peripheral neuropathy ([place]) 12/09/2011   Dyspnea 09/19/2012   Flu 10/17/2017   Gastritis    History of echocardiogram    Echo 6/18: EF 30-35, diffuse HK, grade 1 diastolic dysfunction, trivial MR, mild LAE, mild TR,  no pericardial effusion   History of nuclear stress test    Myoview 5/18: EF 49, no ischemia, inferoseptal defect c/w LBBB artifact (intermediate risk due to EF < 50).   HTN (hypertension)    Hyperlipidemia    Hyperlipidemia associated with type 2 diabetes mellitus ([place]) 03/05/2019   Hypertension associated with diabetes ([place]) 09/28/2006   Qualifier: Diagnosis of  By: [name]     Major depression 03/31/2013   Melena 08/21/2020   NICM (nonischemic cardiomyopathy) ([place])    a. Nuclear 5/13: Normal stress nuclear study. LV Ejection Fraction: 58%  //  b. LHC 10/14: Minor luminal irregularity in prox LAD, EF 35%    Parkinson disease (HCC)    Plantar fasciitis    Plantar fasciitis, bilateral 06/15/2012   Rosacea 05/29/2009   Qualifier: Diagnosis of  By: [name] MD, [name] hearing loss (SNHL), bilateral 01/04/2017   Sleep apnea    was retested and no longer had it and so d/c CPAP   Syncope    Thoracic or lumbosacral neuritis or radiculitis, unspecified 07/03/2013   Tinnitus, bilateral 01/04/2017   Type II diabetes mellitus with complication ([place]) 9/74/1638   Diabetic eye exam done by [facility]: Dr. Prudencio Burly on 11/08/13: no diabetic retinopathy. Repeat in 1 year     Urticaria     Patient Active Problem List  Diagnosis Date Noted   UTI (urinary tract infection) 01/24/2021   Acute on chronic systolic CHF (congestive heart failure), NYHA class 4 ([place]) 10/19/2020   Chronic HFrEF (heart failure with reduced ejection fraction) ([place])    Cervical dystonia 10/07/2020   Advanced care planning/counseling discussion 08/21/2020   Dementia without behavioral disturbance ([place]) 08/21/2020   Vision impairment, Right Eye 08/21/2020   Chronic kidney disease, stage 3a ([place]) 08/18/2020   Frequent urination 07/30/2020   Body mass index (BMI) 26.0-26.9, adult 01/29/2020   Chronic rhinitis 12/18/2019   Chronic respiratory failure with hypoxia ([place]) 12/18/2019   Hyperlipidemia  associated with type 2 diabetes mellitus ([place]) 03/05/2019   Cervical radiculopathy 10/24/2018   Falls frequently    Asthma 08/03/2017   Osteopenia 07/10/2015   Mild persistent asthma in adult without complication 40/34/7425   NICM (nonischemic cardiomyopathy) ([place]) 04/23/2014   Arthritis or polyarthritis, rheumatoid ([place]) 03/12/2014   ICD (implantable cardioverter-defibrillator), dual, in situ 03/06/2014   Heart Failure with Recovered Ejection Fraction, G2DD 05/31/2013   Parkinson disease ([place]) 05/21/2013   Vertigo 03/03/2013   Gastroesophageal reflux disease without esophagitis 09/27/2012   Trigger point with neck pain 04/12/2012   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   Diabetic peripheral neuropathy ([place]) 12/09/2011   Chronic urticaria 05/27/2011   Sinus tachycardia ([place]) 05/27/2011   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 05/20/2011   Type II diabetes mellitus with complication ([place]) 95/63/8756   Hypertension associated with diabetes ([place]) 09/28/2006    Past Surgical History:  Procedure Laterality Date   BREAST EXCISIONAL BIOPSY Left 1970   benign cyst   BREAST SURGERY Left    BUNIONECTOMY     CARDIAC CATHETERIZATION N/A 12/16/2015   Procedure: Right Heart Cath;  Surgeon: Sherren Mocha, MD;  Location: [facility];  Service: Cardiovascular;  Laterality: N/A;   CHOLECYSTECTOMY     eye lid surgery Bilateral 05/09/2019   IMPLANTABLE CARDIOVERTER DEFIBRILLATOR IMPLANT  11-25-13   MDT dual chamber ICD implanted by [name] for primary prevention   IMPLANTABLE CARDIOVERTER DEFIBRILLATOR IMPLANT N/A 11/25/2013   Procedure: IMPLANTABLE CARDIOVERTER DEFIBRILLATOR IMPLANT;  Surgeon: [name], MD;  Location: [facility] C[facility];  Service: Cardiovascular;  Laterality: N/A;   LEFT AND RIGHT HEART CATHETERIZATION WITH CORONARY ANGIOGRAM N/A 05/31/2013   Procedure: LEFT AND RIGHT HEART CATHETERIZATION WITH CORONARY ANGIOGRAM;  Surgeon: [name], MD;  Location: [facility] C[facility] LAB;  Service:  Cardiovascular;  Laterality: N/A;   TONSILLECTOMY     TUBAL LIGATION      OB History   No obstetric history on file.      Home Medications    Prior to Admission medications   Medication Sig Start Date End Date Taking? Authorizing Provider  acetaminophen (TYLENOL) 500 MG tablet Take 2 tablets (1,000 mg total) by mouth every 8 (eight) hours as needed. 01/29/21   Brimage, Ronnette Juniper, DO  APPLE CIDER VINEGAR PO Take 1 capsule by mouth daily.    [provider]  Biotin 5000 MCG CAPS Take 1 tablet by mouth daily.     [provider]  budesonide-formoterol (SYMBICORT) 160-4.5 MCG/ACT inhaler Inhale 2 puffs into the lungs 2 (two) times daily. 10/24/19   Lauraine Rinne, NP  carbidopa-levodopa (SINEMET IR) 25-100 MG tablet Take 1 tablet by mouth 3 (three) times daily. 04/09/21   Tat, Eustace Quail, DO  diclofenac Sodium (VOLTAREN) 1 % GEL Apply 4 g topically 4 (four) times daily. 01/29/21   Brimage, Ronnette Juniper, DO  famotidine (PEPCID) 20 MG tablet TAKE  1 TABLET TWICE DAILY 06/17/19   Caroline More, DO  fluconazole (DIFLUCAN) 150 MG tablet Take 1 tablet (150 mg total) by mouth every 3 (three) days. Take one tablet, if still symptoms of vaginal itching, take 2nd in 72 hours 01/06/21   [name], DO  fluticasone (FLONASE SENSIMIST) 27.5 MCG/SPRAY nasal spray Place 2 sprays into the nose daily. 12/17/20   Parrett, Fonnie Mu, NP  furosemide (LASIX) 20 MG tablet Take 20 mg by mouth daily.    [provider]  levalbuterol (XOPENEX) 0.63 MG/3ML nebulizer solution USE 1 VIAL IN NEBULIZER EVERY 4 TO 6 HOURS AS NEEDED FOR WHEEZING AND FOR SHORTNESS OF BREATH 10/14/20   Mannam, Hart Robinsons, MD  losartan (COZAAR) 25 MG tablet TAKE 1 TABLET EVERY DAY 04/01/20   Sherren Mocha, MD  meclizine (ANTIVERT) 12.5 MG tablet Take 1 tablet (12.5 mg total) by mouth daily as needed for dizziness. Should not be long term. 10/02/20   [name], DO  melatonin 5 MG TABS Take 5 mg by mouth at bedtime.    [provider]  metFORMIN (GLUCOPHAGE) 500 MG tablet Take 2 tablets (1,000 mg total) by mouth 2 (two) times daily with a meal. 06/24/20   [name], DO  metoprolol succinate (TOPROL-XL) 50 MG 24 hr tablet Take 1 tablet (50 mg total) by mouth 2 (two) times daily. Please make yearly appt with Dr. Lovena Le for July 2022 for future refills. Thank you 1st attempt 01/14/21   [name], MD  OXYGEN Inhale into the lungs at bedtime. Inhale 2L into lungs    [provider]  potassium chloride (KLOR-CON) 10 MEQ tablet Take 10 mEq by mouth daily as needed (low potassium).    [provider]  rosuvastatin (CRESTOR) 10 MG tablet Take 1 tablet (10 mg total) by mouth 2 (two) times a week. 01/25/21   [name], DO  spironolactone (ALDACTONE) 25 MG tablet Take 0.5 tablets (12.5 mg total) by mouth daily. 10/20/20   Shary Key, DO  tiZANidine (ZANAFLEX) 2 MG tablet TAKE 1 TABLET BY MOUTH EVERY 8 HOURS AS NEEDED FOR MUSCLE SPASM 03/25/21   Brimage, Ronnette Juniper, DO  Ubiquinone (ULTRA COQ10 PO) Take 1 tablet by mouth 3 (three) times a week.    [provider]    Family History Family History  Problem Relation Age of Onset   Coronary artery disease Father        Died age 43   Heart attack Father    Diabetes Father    Coronary artery disease Mother        Died age 71   Heart attack Mother    Diabetes Mother    Parkinson's disease Sister    Heart disease Sister    Hepatitis C Sister    Diabetes Sister    Diabetes Son 31       T1DM   Healthy Daughter    Diabetes Sister    Heart disease Sister    Diabetes Sister    Heart disease Sister    Breast cancer Neg Hx     Social History Social History   Tobacco Use   Smoking status: Never   Smokeless tobacco: Never   Tobacco comments:    exposed to smoke during child hood (parents)  Vaping Use   Vaping Use: Never used  Substance Use Topics   Alcohol use: No    Alcohol/week: 0.0 standard drinks   Drug use: No  Allergies   Aspirin, Penicillins, Prempro [conj estrog-medroxyprogest ace], Ace inhibitors, and Simvastatin   Review of Systems Review of Systems  Constitutional:  Positive for chills, diaphoresis and fatigue.  HENT:  Positive for congestion and sore throat.   Respiratory:  Positive for cough. Negative for shortness of breath.   Cardiovascular:  Negative for chest pain.  Musculoskeletal:  Positive for myalgias.    Physical Exam Triage Vital Signs ED Triage Vitals [04/21/21 1344]  Enc Vitals Group     BP 112/75     Pulse Rate 86     Resp 18     Temp 99.6 F (37.6 C)     Temp Source Oral     SpO2 92 %     Weight      Height      Head Circumference      Peak Flow      Pain Score 5     Pain Loc      Pain Edu?      Excl. in [place]?    No data found.  Updated Vital Signs BP 112/75 (BP Location: Left Arm)   Pulse 86   Temp 99.6 F (37.6 C) (Oral)   Resp 18   LMP 08/01/2000 (Approximate)   SpO2 92%   Visual Acuity Right Eye Distance:   Left Eye Distance:   Bilateral Distance:    Right Eye Near:   Left Eye Near:    Bilateral Near:     Physical Exam Vitals and nursing note reviewed.  Constitutional:      General: She is not in acute distress.    Appearance: Normal appearance. She is not ill-appearing, toxic-appearing or diaphoretic.  HENT:     Head: Normocephalic and atraumatic.     Right Ear: Tympanic membrane and ear canal normal.     Left Ear: Tympanic membrane and ear canal normal.     Nose: Congestion present.     Mouth/Throat:     Pharynx: Uvula midline. Pharyngeal swelling and posterior oropharyngeal erythema present.     Tonsils: No tonsillar exudate or tonsillar abscesses. 1+ on the right. 1+ on the left.  Eyes:     Conjunctiva/sclera: Conjunctivae normal.  Cardiovascular:     Rate and Rhythm: Normal rate and regular rhythm.     Pulses: Normal pulses.     Heart sounds: Normal heart sounds.  Pulmonary:     Effort: Pulmonary effort is normal.      Breath sounds: Normal breath sounds.  Abdominal:     General: Abdomen is flat.  Musculoskeletal:        General: Normal range of motion.     Cervical back: Normal range of motion.  Skin:    General: Skin is warm and dry.  Neurological:     General: No focal deficit present.     Mental Status: She is alert and oriented to person, place, and time.  Psychiatric:        Mood and Affect: Mood normal.     UC Treatments / Results  Labs (all labs ordered are listed, but only abnormal results are displayed) Labs Reviewed  CULTURE, GROUP A STREP ([place])  SARS CORONAVIRUS 2 (TAT 6-24 HRS)  POCT RAPID STREP A, ED / UC    EKG   Radiology No results found.  Procedures Procedures (including critical care time)  Medications Ordered in UC Medications - No data to display  Initial Impression / Assessment and Plan / UC Course  I have reviewed  the triage vital signs and the nursing notes.  Pertinent labs & imaging results that were available during my care of the patient were reviewed by me and considered in my medical decision making (see chart for details).    Assessment negative for red flags or concerns.  This likely a viral illness.  Rapid strep negative, throat culture pending.  COVID test pending.  Discussed conservative symptom management as described in discharge instructions.  Encourage fluids and rest.  Tylenol and/or ibuprofen as needed.  Follow-up with primary care for reevaluation as soon as possible. Final Clinical Impressions(s) / UC Diagnoses   Final diagnoses:  Viral illness     Discharge Instructions      We will contact you if your COVID test is positive.  Please quarantine while you wait for the results.  If your test is negative you may resume normal activities.  If your test is positive please continue to quarantine for at least 5 days from your symptom onset or until you are without a fever for at least 24 hours after the medications.  You can take  Tylenol and/or Ibuprofen as needed for fever reduction and pain relief.   For cough: honey 1/2 to 1 teaspoon (you can dilute the honey in water or another fluid).  You can also use guaifenesin and dextromethorphan for cough. You can use a humidifier for chest congestion and cough.  If you don't have a humidifier, you can sit in the bathroom with the hot shower running.     For sore throat: try warm salt water gargles, cepacol lozenges, throat spray, warm tea or water with lemon/honey, popsicles or ice, or OTC cold relief medicine for throat discomfort.    For congestion: take a daily anti-histamine like Zyrtec, Claritin, and a oral decongestant, such as pseudoephedrine.  You can also use Flonase 1-2 sprays in each nostril daily.    It is important to stay hydrated: drink plenty of fluids (water, gatorade/powerade/pedialyte, juices, or teas) to keep your throat moisturized and help further relieve irritation/discomfort.   Return or go to the Emergency Department if symptoms worsen or do not improve in the next few days.      ED Prescriptions   None    PDMP not reviewed this encounter.   Pearson Forster, NP 04/21/21 1406

## 2021-04-21 NOTE — ED Triage Notes (Signed)
Pt c/o cough, sore throat, and body aches since Saturday. States saw her cardiologist today and sent here d/t throat redness.

## 2021-04-22 LAB — SARS CORONAVIRUS 2 (TAT 6-24 HRS): SARS Coronavirus 2: POSITIVE — AB

## 2021-04-23 ENCOUNTER — Ambulatory Visit: Payer: Medicare Other

## 2021-04-23 ENCOUNTER — Telehealth: Payer: Self-pay

## 2021-04-23 LAB — CULTURE, GROUP A STREP (THRC)

## 2021-04-23 NOTE — Chronic Care Management (AMB) (Signed)
Chronic Care Management   CCM RN Visit Note  04/23/2021 Name: Veronica Shaw MRN: [id number] DOB: 21-Dec-1950  Subjective: Veronica Shaw is a 70 y.o. year old female who is a primary care patient of Veronica Hensen, DO. The care management team was consulted for assistance with disease management and care coordination needs.    Engaged with patient by telephone for follow up visit in response to provider referral for case management and/or care coordination services.   Consent to Services:  The patient was given information about Chronic Care Management services, agreed to services, and gave verbal consent prior to initiation of services.  Please see initial visit note for detailed documentation.   Patient agreed to services and verbal consent obtained.    Assessment: The patient continues to maintain positive progress with care plan goals. Patient  experienced difficulty with Covid but is getting better. See Care Plan below for interventions and patient self-care actives. Follow up Plan: Patient would like continued follow-up.  CCM RNCM will outreach the patient within the next 2 weeks.  Patient will call office if needed prior to next encounter  Review of patient past medical history, allergies, medications, health status, including review of consultants reports, laboratory and other test data, was performed as part of comprehensive evaluation and provision of chronic care management services.   SDOH (Social Determinants of Health) assessments and interventions performed:    CCM Care Plan  Allergies  Allergen Reactions   Aspirin Swelling and Other (See Comments)    Other reaction(s): Other (See Comments) Causes nose bleeds *ONLY THE COATED ASA* Causes nose bleeds *ONLY THE COATED ASA*   Penicillins Shortness Of Breath and Rash    Shortness of Breath - Throat felt like it was closing.    Prempro [Conj Estrog-Medroxyprogest Ace] Shortness Of Breath    Throat  swelling Throat swelling   Ace Inhibitors Cough   Simvastatin Other (See Comments) and Rash    Muscle aches    Outpatient Encounter Medications as of 04/23/2021  Medication Sig   acetaminophen (TYLENOL) 500 MG tablet Take 2 tablets (1,000 mg total) by mouth every 8 (eight) hours as needed.   APPLE CIDER VINEGAR PO Take 1 capsule by mouth daily.   Biotin 5000 MCG CAPS Take 1 tablet by mouth daily.    budesonide-formoterol (SYMBICORT) 160-4.5 MCG/ACT inhaler Inhale 2 puffs into the lungs 2 (two) times daily.   carbidopa-levodopa (SINEMET IR) 25-100 MG tablet Take 1 tablet by mouth 3 (three) times daily.   diclofenac Sodium (VOLTAREN) 1 % GEL Apply 4 g topically 4 (four) times daily.   famotidine (PEPCID) 20 MG tablet TAKE 1 TABLET TWICE DAILY   fluconazole (DIFLUCAN) 150 MG tablet Take 1 tablet (150 mg total) by mouth every 3 (three) days. Take one tablet, if still symptoms of vaginal itching, take 2nd in 72 hours   fluticasone (FLONASE SENSIMIST) 27.5 MCG/SPRAY nasal spray Place 2 sprays into the nose daily.   furosemide (LASIX) 20 MG tablet Take 20 mg by mouth daily.   levalbuterol (XOPENEX) 0.63 MG/3ML nebulizer solution USE 1 VIAL IN NEBULIZER EVERY 4 TO 6 HOURS AS NEEDED FOR WHEEZING AND FOR SHORTNESS OF BREATH   losartan (COZAAR) 25 MG tablet TAKE 1 TABLET EVERY DAY   meclizine (ANTIVERT) 12.5 MG tablet Take 1 tablet (12.5 mg total) by mouth daily as needed for dizziness. Should not be long term.   melatonin 5 MG TABS Take 5 mg by mouth at bedtime.   metFORMIN (GLUCOPHAGE)  500 MG tablet Take 2 tablets (1,000 mg total) by mouth 2 (two) times daily with a meal.   metoprolol succinate (TOPROL-XL) 50 MG 24 hr tablet Take 1 tablet (50 mg total) by mouth 2 (two) times daily. Please make yearly appt with Dr. Lovena Le for July 2022 for future refills. Thank you 1st attempt   OXYGEN Inhale into the lungs at bedtime. Inhale 2L into lungs   potassium chloride (KLOR-CON) 10 MEQ tablet Take 10 mEq by  mouth daily as needed (low potassium).   rosuvastatin (CRESTOR) 10 MG tablet Take 1 tablet (10 mg total) by mouth 2 (two) times a week.   spironolactone (ALDACTONE) 25 MG tablet Take 0.5 tablets (12.5 mg total) by mouth daily.   tiZANidine (ZANAFLEX) 2 MG tablet TAKE 1 TABLET BY MOUTH EVERY 8 HOURS AS NEEDED FOR MUSCLE SPASM   Ubiquinone (ULTRA COQ10 PO) Take 1 tablet by mouth 3 (three) times a week.   No facility-administered encounter medications on file as of 04/23/2021.    Patient Active Problem List   Diagnosis Date Noted   UTI (urinary tract infection) 01/24/2021   Acute on chronic systolic CHF (congestive heart failure), NYHA class 4 ([place]) 10/19/2020   Chronic HFrEF (heart failure with reduced ejection fraction) ([place])    Cervical dystonia 10/07/2020   Advanced care planning/counseling discussion 08/21/2020   Dementia without behavioral disturbance ([place]) 08/21/2020   Vision impairment, Right Eye 08/21/2020   Chronic kidney disease, stage 3a ([place]) 08/18/2020   Frequent urination 07/30/2020   Body mass index (BMI) 26.0-26.9, adult 01/29/2020   Chronic rhinitis 12/18/2019   Chronic respiratory failure with hypoxia ([place]) 12/18/2019   Hyperlipidemia associated with type 2 diabetes mellitus ([place]) 03/05/2019   Cervical radiculopathy 10/24/2018   Falls frequently    Asthma 08/03/2017   Osteopenia 07/10/2015   Mild persistent asthma in adult without complication 19/50/9326   NICM (nonischemic cardiomyopathy) ([place]) 04/23/2014   Arthritis or polyarthritis, rheumatoid ([place]) 03/12/2014   ICD (implantable cardioverter-defibrillator), dual, in situ 03/06/2014   Heart Failure with Recovered Ejection Fraction, G2DD 05/31/2013   Parkinson disease ([place]) 05/21/2013   Vertigo 03/03/2013   Gastroesophageal reflux disease without esophagitis 09/27/2012   Trigger point with neck pain 04/12/2012   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   Diabetic peripheral neuropathy ([place]) 12/09/2011   Chronic  urticaria 05/27/2011   Sinus tachycardia ([place]) 05/27/2011   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 05/20/2011   Type II diabetes mellitus with complication ([place]) 71/24/5809   Hypertension associated with diabetes ([place]) 09/28/2006    Conditions to be addressed/monitored:CHF and Covid  Care Plan : RN Case Manager  Updates made by Lazaro Arms, RN since 04/23/2021 12:00 AM     Problem: Symptom Exacerbation (Heart Failure)      Long-Range Goal: Patient to manage and monitor signs and symptoms of Heart Failure   Start Date: 11/03/2020  Expected End Date: 05/31/2021  Priority: High  Note:   Current Barriers:  Knowledge deficit related to basic heart failure pathophysiology and self care management  Case Manager Clinical Goal(S):  patient will verbalize understanding of Heart Failure Action Plan and when to call doctor  Interventions:  Basic overview and discussion of  Heart Failure reviewed Provided verbal education on low sodium diet Provided education on low sodium diet in My Chart Reviewed Heart Failure Action Plan  Advised patient to weigh each morning after emptying bladder Discussed importance of daily weight and advised patient to weigh and record daily- Reviewed role of diuretics in prevention of  fluid overload and management of heart failure-patient states that she is taking her medications.  The patient has not weighed due to dealing with covid.  She has been resting in her room. She denies any chest painor swelling. She has been having some shortness of breath and using her oxygen that has help her along with medications.   Patient Goals/Self Care Activities:  Takes Heart Failure Medications as prescribed Weighs daily and record (notifying MD of 3 lb weight gain over night or 5 lb in a week) Verbalizes understanding of and follows CHF Action Plan Adheres to low sodium diet     Problem: Disease Self-Management (Covid)      Goal: The patient will follow instructions given to maintain  health and prevent futher spread of illness   Start Date: 04/23/2021  Expected End Date: 04/30/2021  Priority: High  Note:   Current Barriers:  Chronic Disease Management support and education needs related to Covid  RNCM Clinical Goal(s):  Patient will verbalize basic understanding of Covid disease process and self health management plan provided by ER  through collaboration with RN Care manager, provider, and care team.   Interventions: 1:1 collaboration with primary care provider regarding development and update of comprehensive plan of care as evidenced by provider attestation and co-signature Inter-disciplinary care team collaboration (see longitudinal plan of care) Evaluation of current treatment plan related to  self management and patient's adherence to plan as established by provider   COVID:  (Status: New goal. Condition stable.) Provided education to patient to enhance basic understanding of COVID-19 as a viral disease, measures to prevent exposure, signs and symptoms, recommended vaccine schedule, when to contact provider;,  Provided assistance to patient as requested to obtain appointment for COVID vaccine; the patient has had two vaccine shots and interested in getting another.  She stated that she has already called the office and left a message about getting the next vaccine.  Counseled on importance of regular laboratory monitoring as prescribed; Reminded patient of instruction given by the hospital to follow: please continue to quarantine for at least 5 days from your symptom onset or until you are without a fever for at least 24 hours after the medications. You can take Tylenol and/or Ibuprofen as needed for fever reduction and pain relief.   For cough: honey 1/2 to 1 teaspoon (you can dilute the honey in water or another fluid).  You can also use guaifenesin and dextromethorphan for cough. You can use a humidifier for chest congestion and cough.  If you don't have a  humidifier, you can sit in the bathroom with the hot shower running.     For sore throat: try warm salt water gargles, cepacol lozenges, throat spray, warm tea or water with lemon/honey, popsicles or ice, or OTC cold relief medicine for throat discomfort.   For congestion: take a daily anti-histamine like Zyrtec, Claritin, and a oral decongestant, such as pseudoephedrine.  You can also use Flonase 1-2 sprays in each nostril daily.   It is important to stay hydrated: drink plenty of fluids (water, gatorade/powerade/pedialyte, juices, or teas) to keep your throat moisturized and help further relieve irritation/discomfort.  The patient also asked if I would send her PCP a message regarding her DM and having elevated blood sugars.  Message sent via epic.  Patient Goals/Self-Care Activities: Patient will self administer medications as prescribed as evidenced by self report/primary caregiver report  Patient will attend all scheduled provider appointments as evidenced by clinician review of documented attendance  to scheduled appointments and patient/caregiver report Patient will call pharmacy for medication refills as evidenced by patient report and review of pharmacy fill history as appropriate Patient will call provider office for new concerns or questions as evidenced by review of documented incoming telephone call notes and patient report        Lazaro Arms RN, BSN, [facility] Care Management Coordinator [place] Phone: [phone] I Fax: [phone]

## 2021-04-23 NOTE — Telephone Encounter (Signed)
Patient calls nurse line reporting a positive covid test on 9/21. Patient reports symptom onset was 9/20 with fevers, body aches, and sore throat. Patient is interested in antivirals. Please advise.   Conservative measures given to patient as well as ED precautions.

## 2021-04-23 NOTE — Patient Instructions (Signed)
Visit Information  Veronica Shaw  it was nice speaking with you. Please call me directly [phone] if you have questions about the goals we discussed.   Goals Addressed             This Visit's Progress    Track and Manage Fluids and Swelling       Timeframe:  Long-Range Goal Priority:  High Start Date:   11/03/20                        Expected End Date:  05/31/21     - call office if I gain more than 2 pounds in one day or 5 pounds in one week - keep legs up while sitting - track weight in diary - use salt in moderation - watch for swelling in feet, ankles and legs every day - weigh myself daily    Why is this important?   It is important to check your weight daily and watch how much salt and liquids you have.  It will help you to manage your heart failure     Track and Manage Symptoms       Timeframe:  Long-Range Goal Priority:  High Start Date:11/03/20                            Expected End Date: 05/31/21                   - develop a rescue plan - follow rescue plan if symptoms flare-up - know when to call the doctor - track symptoms and what helps feel better or worse    Why is this important?   You will be able to handle your symptoms better if you keep track of them.  Making some simple changes to your lifestyle will help.  Eating healthy is one thing you can do to take good care of yourself.    Notes:       The patient verbalizes understanding of the information and instructions discussed today.  Our next appointment is scheduled for  05/07/21.   Please feel free to call me or the office if you have any questions or concerns.  Lazaro Arms RN, BSN, [facility] Care Management Coordinator [place] Phone: [phone] I Fax: [phone]

## 2021-04-26 NOTE — Telephone Encounter (Signed)
Contacted patient to check on her. Patient reports she is feeling a little bit better, still having congestion and fatigue. Patient will call if she does not continue to improve.

## 2021-04-27 ENCOUNTER — Ambulatory Visit (HOSPITAL_COMMUNITY): Payer: Self-pay

## 2021-04-28 ENCOUNTER — Ambulatory Visit: Payer: Medicare Other | Admitting: Neurology

## 2021-04-28 ENCOUNTER — Other Ambulatory Visit: Payer: Self-pay | Admitting: Family Medicine

## 2021-04-28 ENCOUNTER — Ambulatory Visit (INDEPENDENT_AMBULATORY_CARE_PROVIDER_SITE_OTHER): Payer: Medicare Other

## 2021-04-28 ENCOUNTER — Ambulatory Visit (INDEPENDENT_AMBULATORY_CARE_PROVIDER_SITE_OTHER): Payer: Medicare Other | Admitting: Acute Care

## 2021-04-28 ENCOUNTER — Other Ambulatory Visit: Payer: Self-pay

## 2021-04-28 ENCOUNTER — Encounter: Payer: Self-pay | Admitting: Acute Care

## 2021-04-28 ENCOUNTER — Telehealth: Payer: Self-pay | Admitting: Adult Health

## 2021-04-28 VITALS — BP 124/70 | HR 75 | Temp 97.3°F | Ht 61.0 in | Wt 137.4 lb

## 2021-04-28 DIAGNOSIS — R0600 Dyspnea, unspecified: Secondary | ICD-10-CM

## 2021-04-28 DIAGNOSIS — R059 Cough, unspecified: Secondary | ICD-10-CM

## 2021-04-28 DIAGNOSIS — E118 Type 2 diabetes mellitus with unspecified complications: Secondary | ICD-10-CM

## 2021-04-28 DIAGNOSIS — J4530 Mild persistent asthma, uncomplicated: Secondary | ICD-10-CM | POA: Diagnosis not present

## 2021-04-28 MED ORDER — HYDROCODONE BIT-HOMATROP MBR 5-1.5 MG/5ML PO SOLN
5.0000 mL | Freq: Every evening | ORAL | 0 refills | Status: DC | PRN
Start: 2021-04-28 — End: 2021-04-30

## 2021-04-28 MED ORDER — DOXYCYCLINE HYCLATE 100 MG PO TABS
100.0000 mg | ORAL_TABLET | Freq: Two times a day (BID) | ORAL | 0 refills | Status: DC
Start: 2021-04-28 — End: 2021-06-01

## 2021-04-28 MED ORDER — PREDNISONE 10 MG PO TABS: ORAL_TABLET | ORAL | 0 refills | Status: DC

## 2021-04-28 NOTE — Patient Instructions (Addendum)
It is good to see you today. I'm sorry you are not feeling well. CXR today We will call you with results We have sent in a prescription for prednisone taper Prednisone taper; 10 mg tablets: 4 tabs x 2 days, 3 tabs x 2 days, 2 tabs x 2 days 1 tab x 2 days then stop.  We have sent in a prescription for Doxycycline 100 mg twice daily x 7 days Take until gone.  Use sun block if you go out in the sun.  Use Delsym every 12 hours for cough We will send in a prescription for a cough medicine with Codeine at bedtime . Take 5 cc's at bedtime Sips of water instead of throat clearing Sugar Free Jolly Ranchers or Werther's originals for throat soothing. Delsym Cough syrup 5 cc's every 12 hours We will add Mucinex 1200 mg in the morning with a full glass of water to thin secretions Continue Symbicort daily as you have been doing. Follow up in 1 week to make sure you are better You call us if you need [place] sooner Please contact office for sooner follow up if symptoms do not improve or worsen or seek emergency care

## 2021-04-28 NOTE — Progress Notes (Signed)
Please call patient and let her know her CXR does not show pneumonia. Have her take the prednisone and antibiotics and follow up next week. Have her call if she gets worse not better, or seek emergency care. Thanks

## 2021-04-28 NOTE — Telephone Encounter (Signed)
Called and spoke with pt who states she has been having problems with the cough and phlegm and increased SOB for 1 week.  Pt has been taking an OTC cold medicine to see if it would help with her cough. Pt has had to do one neb treatment a day to see if it would help. Pt is only having to use 2L O2 at night and states that she has been having to use her O2 during the day at 2L due to the SOB. Stated to pt that we needed to get her in for an OV to reevaluate since her breathing has become worse and since she is having to use her O2 more and pt verbalized understanding. Appt has been scheduled for pt today 9/28 at 4:30 with SG. Nothing further needed.

## 2021-04-28 NOTE — Progress Notes (Signed)
History of Present Illness Veronica Shaw is a 70 y.o. female never smoker with moderate persist asthma.  She is followed by Veronica Shaw.    04/28/2021 Pt. Presents for an acute visit after having been diagnosed with Covid 19 on  04/17/2021. She was not treated with an anti viral by her PCP.[name] Kitchen She has had worsening shortness of breath and and increase in mucus production over the last week.Mucus is a yellow color. This is thick.  She has been using OTC cold medicine to see if it would help with her cough. Pt has had to do one albuterol neb treatment a day to see if it would help. She states these do help a little. She has shortness of breath and cough with minimal exertion. Pt is only having to use 2L O2 at night and states that she has been having to use her O2 during the day at 2L due to the SOB. No new pain or swelling in her legs. She did have fever x 4 days when she was in the early days of her Covid. She said her temp was about 101.5 at that time.  She said her cough is keeping her up at night. She denies any hemoptysis, chest pain, orthopnea.   Test Results: Spirometry 01/05/15  FEV1  0.98 (46%) ratio 62  - - PFTs 02/17/15  FEV1  1.97 ( 93%) ratio 79 p 17% improvement from saba and dlco 107  - 08/19/2015  Walked RA x 3 laps @ 185 ft each stopped due to End of study, nl pace, no sob or desat   - NO 08/19/2015 = 79  - Spirometry 08/19/2015  FEV1 1.32 (65%)  Ratio 71 p am dulera 200 x one puff    2021 Sleep study showed no significant sleep apnea.  Her AHI was 2.9/hour.  She did have some mild desaturations with an SaO2 low at 86%.   2021 ONO + desats    2D echo May 2017 EF 20 to 12%, marked systolic dyssynchrony, grade 1 diastolic dysfunction   2D echo June 2018 EF 30 to 35%, diffuse hypokinesis, grade 1 diastolic dysfunction   2D echo March 2019 EF 60 to 45%, grade 2 diastolic dysfunction.  CXR 04/29/2021 LEFT subclavian ICD with leads projecting at RIGHT atrium and RIGHT ventricle  unchanged. Normal heart size, mediastinal contours, and pulmonary vascularity. Atherosclerotic calcification aorta. Lungs clear. Calcified granuloma LEFT costophrenic angle again noted. No infiltrate, pleural effusion, or pneumothorax. Multilevel endplate spur formation thoracic spine.   IMPRESSION: No acute abnormalities.      CBC Latest Ref Rng & Units 04/20/2021 01/18/2021 01/01/2021  WBC 3.4 - 10.8 x10E3/uL 6.7 10.8(H) 6.9  Hemoglobin 11.1 - 15.9 g/dL 11.9 12.7 12.5  Hematocrit 34.0 - 46.6 % 34.4 38.6 38.8  Platelets 150 - 450 x10E3/uL 192 239 213    BMP Latest Ref Rng & Units 04/20/2021 01/22/2021 01/18/2021  Glucose 65 - 99 mg/dL 142(H) 175(H) 175(H)  BUN 8 - 27 mg/dL 16 29(H) 23  Creatinine 0.57 - 1.00 mg/dL 1.24(H) 1.50(H) 1.39(H)  BUN/Creat Ratio 12 - 28 13 19  -  Sodium 134 - 144 mmol/L 140 144 137  Potassium 3.5 - 5.2 mmol/L 4.6 4.6 4.6  Chloride 96 - 106 mmol/L 100 103 101  CO2 20 - 29 mmol/L 21 21 27   Calcium 8.7 - 10.3 mg/dL 9.9 9.8 10.3    BNP    Component Value Date/Time   BNP 20.6 10/17/2020 1156    ProBNP  Component Value Date/Time   PROBNP 262 04/20/2021 1432   PROBNP 52.0 12/09/2014 1109    PFT    Component Value Date/Time   FEV1PRE 1.67 02/17/2015 0953   FEV1POST 1.97 02/17/2015 0953   FVCPRE 2.23 02/17/2015 0953   FVCPOST 2.49 02/17/2015 0953   TLC 4.32 02/17/2015 0953   DLCOUNC 21.77 02/17/2015 0953   PREFEV1FVCRT 75 02/17/2015 0953   PSTFEV1FVCRT 79 02/17/2015 0953    No results found.   Past medical hx Past Medical History:  Diagnosis Date   Acute on chronic systolic CHF (congestive heart failure), NYHA class 4 (HCC) 10/19/2020   Asthma    Back pain 09/18/2019   Cervical disc disorder with radiculopathy of cervical region 6/94/8546   Chronic systolic CHF (congestive heart failure) ([place])    a. cMRI 4/15: EF 34% and findings - c/w NICM, normal RV size and function (RVEF 61%), Mild MR // b. Echo 2/15:  EF 30-35%, diff HK, ant-sept AK,  Gr 2 DD, mild MR, trivial TR  //  c. Echo 5/17: EF 20-25%, severe diffuse HK, marked systolic dyssynchrony, grade 1 diastolic dysfunction, mild MR  //  d. [place] 5/17: Fick CO 2.9, RVSP 19, PASP 15, PW mean 2, low filing pressures and preserved CO    Cognitive impairment 10/19/2017   MOCA was administered with a score of 23/30   Diabetes mellitus    Diabetic peripheral neuropathy ([place]) 12/09/2011   Dyspnea 09/19/2012   Flu 10/17/2017   Gastritis    History of echocardiogram    Echo 6/18: EF 30-35, diffuse HK, grade 1 diastolic dysfunction, trivial MR, mild LAE, mild TR, no pericardial effusion   History of nuclear stress test    Myoview 5/18: EF 49, no ischemia, inferoseptal defect c/w LBBB artifact (intermediate risk due to EF < 50).   HTN (hypertension)    Hyperlipidemia    Hyperlipidemia associated with type 2 diabetes mellitus ([place]) 03/05/2019   Hypertension associated with diabetes ([place]) 09/28/2006   Qualifier: Diagnosis of  By: [name]     Major depression 03/31/2013   Melena 08/21/2020   NICM (nonischemic cardiomyopathy) ([place])    a. Nuclear 5/13: Normal stress nuclear study. LV Ejection Fraction: 58%  //  b. LHC 10/14: Minor luminal irregularity in prox LAD, EF 35%    Parkinson disease (HCC)    Plantar fasciitis    Plantar fasciitis, bilateral 06/15/2012   Rosacea 05/29/2009   Qualifier: Diagnosis of  By: [name] MD, [name] hearing loss (SNHL), bilateral 01/04/2017   Sleep apnea    was retested and no longer had it and so d/c CPAP   Syncope    Thoracic or lumbosacral neuritis or radiculitis, unspecified 07/03/2013   Tinnitus, bilateral 01/04/2017   Type II diabetes mellitus with complication ([place]) 2/70/3500   Diabetic eye exam done by [facility]: Dr. Prudencio Burly on 11/08/13: no diabetic retinopathy. Repeat in 1 year     Urticaria      Social History   Tobacco Use   Smoking status: Never   Smokeless tobacco: Never   Tobacco comments:    exposed to  smoke during child hood (parents)  Vaping Use   Vaping Use: Never used  Substance Use Topics   Alcohol use: No    Alcohol/week: 0.0 standard drinks   Drug use: No    Ms.Veronica Shaw reports that she has never smoked. She has never used smokeless tobacco. She reports that she does not drink alcohol and  does not use drugs.  Tobacco Cessation: Never Smoker  Past surgical hx, Family hx, Social hx all reviewed.  Current Outpatient Medications on File Prior to Visit  Medication Sig   acetaminophen (TYLENOL) 500 MG tablet Take 2 tablets (1,000 mg total) by mouth every 8 (eight) hours as needed.   APPLE CIDER VINEGAR PO Take 1 capsule by mouth daily.   Biotin 5000 MCG CAPS Take 1 tablet by mouth daily.    budesonide-formoterol (SYMBICORT) 160-4.5 MCG/ACT inhaler Inhale 2 puffs into the lungs 2 (two) times daily.   carbidopa-levodopa (SINEMET IR) 25-100 MG tablet Take 1 tablet by mouth 3 (three) times daily.   diclofenac Sodium (VOLTAREN) 1 % GEL Apply 4 g topically 4 (four) times daily.   famotidine (PEPCID) 20 MG tablet TAKE 1 TABLET TWICE DAILY   fluconazole (DIFLUCAN) 150 MG tablet Take 1 tablet (150 mg total) by mouth every 3 (three) days. Take one tablet, if still symptoms of vaginal itching, take 2nd in 72 hours   fluticasone (FLONASE SENSIMIST) 27.5 MCG/SPRAY nasal spray Place 2 sprays into the nose daily.   furosemide (LASIX) 20 MG tablet Take 20 mg by mouth daily.   levalbuterol (XOPENEX) 0.63 MG/3ML nebulizer solution USE 1 VIAL IN NEBULIZER EVERY 4 TO 6 HOURS AS NEEDED FOR WHEEZING AND FOR SHORTNESS OF BREATH   losartan (COZAAR) 25 MG tablet TAKE 1 TABLET EVERY DAY   meclizine (ANTIVERT) 12.5 MG tablet Take 1 tablet (12.5 mg total) by mouth daily as needed for dizziness. Should not be long term.   melatonin 5 MG TABS Take 5 mg by mouth at bedtime.   metFORMIN (GLUCOPHAGE) 500 MG tablet Take 2 tablets (1,000 mg total) by mouth 2 (two) times daily with a meal.   metoprolol succinate  (TOPROL-XL) 50 MG 24 hr tablet Take 1 tablet (50 mg total) by mouth 2 (two) times daily. Please make yearly appt with Dr. Lovena Le for July 2022 for future refills. Thank you 1st attempt   OXYGEN Inhale into the lungs at bedtime. Inhale 2L into lungs   potassium chloride (KLOR-CON) 10 MEQ tablet Take 10 mEq by mouth daily as needed (low potassium).   rosuvastatin (CRESTOR) 10 MG tablet Take 1 tablet (10 mg total) by mouth 2 (two) times a week.   spironolactone (ALDACTONE) 25 MG tablet Take 0.5 tablets (12.5 mg total) by mouth daily.   tiZANidine (ZANAFLEX) 2 MG tablet TAKE 1 TABLET BY MOUTH EVERY 8 HOURS AS NEEDED FOR MUSCLE SPASM   Ubiquinone (ULTRA COQ10 PO) Take 1 tablet by mouth 3 (three) times a week.   No current facility-administered medications on file prior to visit.     Allergies  Allergen Reactions   Aspirin Swelling and Other (See Comments)    Other reaction(s): Other (See Comments) Causes nose bleeds *ONLY THE COATED ASA* Causes nose bleeds *ONLY THE COATED ASA*   Penicillins Shortness Of Breath and Rash    Shortness of Breath - Throat felt like it was closing.    Prempro [Conj Estrog-Medroxyprogest Ace] Shortness Of Breath    Throat swelling Throat swelling   Ace Inhibitors Cough   Simvastatin Other (See Comments) and Rash    Muscle aches    Review Of Systems:  Constitutional:   No  weight loss, night sweats,  Fevers, chills, fatigue, or  lassitude.  HEENT:   No headaches,  Difficulty swallowing,  Tooth/dental problems, or  Sore throat,  No sneezing, itching, ear ache, nasal congestion, post nasal drip,   CV:  No chest pain,  Orthopnea, PND, swelling in lower extremities, anasarca, dizziness, palpitations, syncope.   GI  No heartburn, indigestion, abdominal pain, nausea, vomiting, diarrhea, change in bowel habits, loss of appetite, bloody stools.   Resp: + shortness of breath with exertion or at rest.  + excess mucus, + productive cough,  No  non-productive cough,  No coughing up of blood.  + change in color of mucus.  No wheezing.  No chest wall deformity  Skin: no rash or lesions.  GU: no dysuria, change in color of urine, no urgency or frequency.  No flank pain, no hematuria   MS:  No joint pain or swelling.  No decreased range of motion.  No back pain.  Psych:  No change in mood or affect. No depression or anxiety.  No memory loss.   Vital Signs BP 124/70 (BP Location: Left Arm, Cuff Size: Normal)   Pulse 75   Temp (!) 97.3 F (36.3 C) (Oral)   Ht 5\' 1"  (1.549 m)   Wt 137 lb 6.4 oz (62.3 kg)   LMP 08/01/2000 (Approximate)   SpO2 98%   BMI 25.96 kg/m    Physical Exam:  General- No distress,  A& Ox 3,pleasant ENT: No sinus tenderness, TM clear, pale nasal mucosa, no oral exudate,no post nasal drip, no LAN Cardiac: S1, S2, regular rate and rhythm, no murmur Chest: No wheeze/ rales/ dullness; no accessory muscle use, no nasal flaring, no sternal retractions Abd.: Soft Non-tender, ND, BS +, Body mass index is 25.96 kg/m.  Ext: No clubbing cyanosis, edema Neuro:  normal strength, MAE x 4, A&O x 3, appropriate Skin: No rashes, warm and dry, no lesions  Psych: normal mood and behavior   Assessment/Plan Dyspnea with cough and increase in secretions  post Covid Pneumonia in asthmatic Plan I'm sorry you are not feeling well. CXR today We will call you with results We have sent in a prescription for prednisone taper Prednisone taper; 10 mg tablets: 4 tabs x 2 days, 3 tabs x 2 days, 2 tabs x 2 days 1 tab x 2 days then stop.  We have sent in a prescription for Doxycycline 100 mg twice daily x 7 days Take until gone.  Use sun block if you go out in the sun.  Use Delsym every 12 hours for cough We will send in a prescription for a cough medicine with Codeine at bedtime . Take 5 cc's at bedtime Sips of water instead of throat clearing Sugar Free Jolly Ranchers or Werther's originals for throat soothing. Delsym  Cough syrup 5 cc's every 12 hours We will add Mucinex 1200 mg in the morning with a full glass of water to thin secretions Continue Symbicort daily as you have been doing. Follow up in 1 week to make sure you are better You call us if you need [place] sooner Please contact office for sooner follow up if symptoms do not improve or worsen or seek emergency care    I spent 40  minutes dedicated to the care of this patient on the date of this encounter to include pre-visit review of records, face-to-face time with the patient discussing conditions above, post visit ordering of testing, clinical documentation with the electronic health record, making appropriate referrals as documented, and communicating necessary information to the patient's healthcare team.      Magdalen Spatz, NP 04/28/2021  4:35 PM

## 2021-04-29 ENCOUNTER — Telehealth: Payer: Self-pay | Admitting: Acute Care

## 2021-04-29 DIAGNOSIS — R059 Cough, unspecified: Secondary | ICD-10-CM

## 2021-04-29 NOTE — Telephone Encounter (Signed)
I called and spoke with patient regarding message.patient stated cough syrup was too expensive at pharmacy. Asking to send to [facility]. Will route to sarah to send in.  Sarah, please advise on cough syrup to [facility]. Thanks!

## 2021-04-30 MED ORDER — HYDROCODONE BIT-HOMATROP MBR 5-1.5 MG/5ML PO SOLN: 5.0000 mL | Freq: Every evening | ORAL | 0 refills | Status: DC | PRN

## 2021-04-30 NOTE — Telephone Encounter (Signed)
Sent RX for [employer]

## 2021-04-30 NOTE — Telephone Encounter (Signed)
Called patient but she did not answer. Left message for her to call back.  

## 2021-04-30 NOTE — Telephone Encounter (Signed)
Routing to [employer] who is APP of the day.  Can you please send in a script of cough syrup to [facility] for patient? She stated it was more expensive at her normal pharmacy and sarah cannot get to a fingerprint scanner. Thanks!

## 2021-05-03 ENCOUNTER — Encounter: Payer: Medicare Other | Admitting: Physical Medicine and Rehabilitation

## 2021-05-03 NOTE — Telephone Encounter (Signed)
Patient is aware of below message and voiced her understanding.  Nothing further needed at this time.   

## 2021-05-05 ENCOUNTER — Other Ambulatory Visit: Payer: Self-pay | Admitting: Cardiovascular Disease

## 2021-05-05 ENCOUNTER — Ambulatory Visit: Payer: Medicare Other | Admitting: Acute Care

## 2021-05-05 ENCOUNTER — Telehealth: Payer: Self-pay | Admitting: Pulmonary Disease

## 2021-05-05 NOTE — Telephone Encounter (Signed)
Patient was scheduled 05/05/21 with Judson Roch, NP, but cancelled.  I called Patient and left a detailed message to call office to get new patient consult scheduled with Dr. Vaughan Browner.  Message received- Marshell Garfinkel, MD  Magdalen Spatz, NP; Elton Sin, LPN This is not my patient. Looks like she last saw [facility] and has not established with a new doc. Please make a visit with me as new patient.   She is seeing Eric Form next week

## 2021-05-06 NOTE — Progress Notes (Signed)
    SUBJECTIVE:   CHIEF COMPLAINT / HPI:   Diabetic Follow Up: Patient is a 70 y.o. female who present today for diabetic follow up. Had covid a couple weeks ago, fell but nothing hurt.   Home medications include: metformin 1000 mg daily, losartan 25 mg daily Patient endorses taking these medications as prescribed.  Most recent A1Cs:  Lab Results  Component Value Date   HGBA1C 7.4 (A) 05/07/2021   HGBA1C 7.6 (A) 01/22/2021   HGBA1C 7.2 (A) 07/15/2020   Last Microalbumin, LDL, Creatinine: Lab Results  Component Value Date   LDLCALC 129 (H) 01/22/2021   CREATININE 1.24 (H) 04/20/2021   Patient does check blood glucose on a regular basis. Yesterday 174, highest 248. No lows.   Patient is up to date on diabetic eye. Has gone this year, told eye doctor to send records over.  Patient is up to date on diabetic foot exam.   Dizziness- Says it has been going on for a couple of months, and worse after getting Covid virus. Worse when standing up and about to get up. Has not had any fainting. Usually has low BPs at home and at office in past.  Frequent urination Says this is a chronic issue for her. Denies burning, pain, or dysuria.  Post Covid Infection 9/21 Says her breathing has improved greatly from acute infection but does still have some decreased energy.  Weight Loss Says she has lost 7 pounds since Covid infection started. Says she has had taste and food differences.  PERTINENT  PMH / PSH: HFrEF with NICM and ICD, ICD, Sinus tachycardia, frequent urination, frequent falls, osteopenia, hyperlipidemia, type 2 diabetes  OBJECTIVE:   BP (!) 104/59   Pulse 87   Ht 5\' 1"  (1.549 m)   Wt 130 lb 6.4 oz (59.1 kg)   LMP 08/01/2000 (Approximate)   SpO2 98%   BMI 24.64 kg/m   Gen: Non-toxic, NAD, alert and responsive to questions, walks with cane and got up on bed without assistance Resp: CTAB, no iWOB Eyes: equal and reactive to light bilaterally CV: 2+ pulse bilaterally in  LE, RRR ICD in place Neuro: No one sided weakness, no focal deficits Extremities: No LE edema  ASSESSMENT/PLAN:   Type II diabetes mellitus with complication ([place]) Had no lows in blood sugar.  A1c today was 7.4.  Patient was concerned about metformin use with kidneys but shared she has been on this for a while and likely okay. -Continue metformin 1000 mg daily -Continue losartan -Follow-up on eye examination records  Falls frequently Patient feels that she has been having increased dizziness for a couple of months.  Likely secondary to Parkinson's or hypotension. -Advised patient to monitor and document blood pressures and pulses for next visit with PCP for possible adjustment to medication  Frequent urination Patient had frequent urination which she says has been chronic due to her lasix. Denies dysuria. -advised patient to call if experiencing dysuria, abdominal pain, fevers to obtain UA  Weight loss Patient says that she has lost 7 pounds since COVID infection 9/21.  Says she has noticed taste in food.  Likely secondary to decreased intake during infection. -Follow-up on weight at next visit    Gerrit Heck, MD [place]

## 2021-05-07 ENCOUNTER — Ambulatory Visit: Payer: Medicare Other

## 2021-05-07 ENCOUNTER — Ambulatory Visit (INDEPENDENT_AMBULATORY_CARE_PROVIDER_SITE_OTHER): Payer: Medicare Other | Admitting: Student

## 2021-05-07 ENCOUNTER — Encounter: Payer: Self-pay | Admitting: Student

## 2021-05-07 ENCOUNTER — Other Ambulatory Visit: Payer: Self-pay

## 2021-05-07 VITALS — BP 104/59 | HR 87 | Ht 61.0 in | Wt 130.4 lb

## 2021-05-07 DIAGNOSIS — R350 Frequency of micturition: Secondary | ICD-10-CM

## 2021-05-07 DIAGNOSIS — E118 Type 2 diabetes mellitus with unspecified complications: Secondary | ICD-10-CM

## 2021-05-07 DIAGNOSIS — R296 Repeated falls: Secondary | ICD-10-CM | POA: Diagnosis not present

## 2021-05-07 DIAGNOSIS — R634 Abnormal weight loss: Secondary | ICD-10-CM | POA: Diagnosis not present

## 2021-05-07 LAB — POCT GLYCOSYLATED HEMOGLOBIN (HGB A1C): HbA1c, POC (controlled diabetic range): 7.4 % — AB (ref 0.0–7.0)

## 2021-05-07 NOTE — Chronic Care Management (AMB) (Signed)
Chronic Care Management   CCM RN Visit Note  05/07/2021 Name: Veronica Shaw MRN: [id number] DOB: April 13, 1951  Subjective: Veronica Shaw is a 70 y.o. year old female who is a primary care patient of Lyndee Hensen, DO. The care management team was consulted for assistance with disease management and care coordination needs.    Engaged with patient by telephone for follow up visit in response to provider referral for case management and/or care coordination services.   Consent to Services:  The patient was given information about Chronic Care Management services, agreed to services, and gave verbal consent prior to initiation of services.  Please see initial visit note for detailed documentation.   Patient agreed to services and verbal consent obtained.    Assessment: Patient is making progress with her health , but continues to have difficulty with low energy related to past episode of covid . See Care Plan below for interventions and patient self-care actives. Follow up Plan: Patient would like continued follow-up.  CCM RNCM will outreach the patient within the next 4 weeks.  Patient will call office if needed prior to next encounter Review of patient past medical history, allergies, medications, health status, including review of consultants reports, laboratory and other test data, was performed as part of comprehensive evaluation and provision of chronic care management services.   SDOH (Social Determinants of Health) assessments and interventions performed:    CCM Care Plan  Allergies  Allergen Reactions   Aspirin Swelling and Other (See Comments)    Other reaction(s): Other (See Comments) Causes nose bleeds *ONLY THE COATED ASA* Causes nose bleeds *ONLY THE COATED ASA*   Penicillins Shortness Of Breath and Rash    Shortness of Breath - Throat felt like it was closing.    Prempro [Conj Estrog-Medroxyprogest Ace] Shortness Of Breath    Throat swelling Throat swelling    Ace Inhibitors Cough   Simvastatin Other (See Comments) and Rash    Muscle aches    Outpatient Encounter Medications as of 05/07/2021  Medication Sig   acetaminophen (TYLENOL) 500 MG tablet Take 2 tablets (1,000 mg total) by mouth every 8 (eight) hours as needed.   APPLE CIDER VINEGAR PO Take 1 capsule by mouth daily.   Biotin 5000 MCG CAPS Take 1 tablet by mouth daily.    budesonide-formoterol (SYMBICORT) 160-4.5 MCG/ACT inhaler Inhale 2 puffs into the lungs 2 (two) times daily.   carbidopa-levodopa (SINEMET IR) 25-100 MG tablet Take 1 tablet by mouth 3 (three) times daily.   diclofenac Sodium (VOLTAREN) 1 % GEL Apply 4 g topically 4 (four) times daily.   doxycycline (VIBRA-TABS) 100 MG tablet Take 1 tablet (100 mg total) by mouth 2 (two) times daily. (Patient not taking: Reported on 05/07/2021)   famotidine (PEPCID) 20 MG tablet TAKE 1 TABLET TWICE DAILY   fluconazole (DIFLUCAN) 150 MG tablet Take 1 tablet (150 mg total) by mouth every 3 (three) days. Take one tablet, if still symptoms of vaginal itching, take 2nd in 72 hours   fluticasone (FLONASE SENSIMIST) 27.5 MCG/SPRAY nasal spray Place 2 sprays into the nose daily.   furosemide (LASIX) 20 MG tablet Take 20 mg by mouth daily.   HYDROcodone bit-homatropine (HYCODAN) 5-1.5 MG/5ML syrup Take 5 mLs by mouth at bedtime as needed for cough. (Patient not taking: Reported on 05/07/2021)   levalbuterol (XOPENEX) 0.63 MG/3ML nebulizer solution USE 1 VIAL IN NEBULIZER EVERY 4 TO 6 HOURS AS NEEDED FOR WHEEZING AND FOR SHORTNESS OF BREATH   losartan (  COZAAR) 25 MG tablet TAKE 1 TABLET EVERY DAY   meclizine (ANTIVERT) 12.5 MG tablet Take 1 tablet (12.5 mg total) by mouth daily as needed for dizziness. Should not be long term. (Patient not taking: Reported on 05/07/2021)   melatonin 5 MG TABS Take 5 mg by mouth at bedtime.   metFORMIN (GLUCOPHAGE) 500 MG tablet Take 2 tablets (1,000 mg total) by mouth 2 (two) times daily with a meal.   metoprolol  succinate (TOPROL-XL) 50 MG 24 hr tablet Take 1 tablet (50 mg total) by mouth 2 (two) times daily. Please make yearly appt with Dr. Lovena Le for July 2022 for future refills. Thank you 1st attempt   OXYGEN Inhale into the lungs at bedtime. Inhale 2L into lungs   potassium chloride (KLOR-CON) 10 MEQ tablet Take 10 mEq by mouth daily as needed (low potassium).   predniSONE (DELTASONE) 10 MG tablet Take 4 tabs x 2 days, then 3 x 2d, then 2 x 2d, then 1 x 2d and STOP (Patient not taking: Reported on 05/07/2021)   rosuvastatin (CRESTOR) 10 MG tablet TAKE 1 TABLET BY MOUTH TWICE A WEEK (Patient not taking: Reported on 05/07/2021)   spironolactone (ALDACTONE) 25 MG tablet TAKE 1/2 TABLET EVERY DAY   tiZANidine (ZANAFLEX) 2 MG tablet TAKE 1 TABLET BY MOUTH EVERY 8 HOURS AS NEEDED FOR MUSCLE SPASM (Patient not taking: Reported on 05/07/2021)   Ubiquinone (ULTRA COQ10 PO) Take 1 tablet by mouth 3 (three) times a week.   No facility-administered encounter medications on file as of 05/07/2021.    Patient Active Problem List   Diagnosis Date Noted   Weight loss 05/07/2021   UTI (urinary tract infection) 01/24/2021   Acute on chronic systolic CHF (congestive heart failure), NYHA class 4 ([place]) 10/19/2020   Chronic HFrEF (heart failure with reduced ejection fraction) ([place])    Cervical dystonia 10/07/2020   Advanced care planning/counseling discussion 08/21/2020   Dementia without behavioral disturbance ([place]) 08/21/2020   Vision impairment, Right Eye 08/21/2020   Chronic kidney disease, stage 3a ([place]) 08/18/2020   Frequent urination 07/30/2020   Body mass index (BMI) 26.0-26.9, adult 01/29/2020   Chronic rhinitis 12/18/2019   Chronic respiratory failure with hypoxia ([place]) 12/18/2019   Hyperlipidemia associated with type 2 diabetes mellitus ([place]) 03/05/2019   Cervical radiculopathy 10/24/2018   Falls frequently    Asthma 08/03/2017   Osteopenia 07/10/2015   Mild persistent asthma in adult without  complication 42/70/6237   NICM (nonischemic cardiomyopathy) ([place]) 04/23/2014   Arthritis or polyarthritis, rheumatoid ([place]) 03/12/2014   ICD (implantable cardioverter-defibrillator), dual, in situ 03/06/2014   Heart Failure with Recovered Ejection Fraction, G2DD 05/31/2013   Parkinson disease ([place]) 05/21/2013   Vertigo 03/03/2013   Gastroesophageal reflux disease without esophagitis 09/27/2012   Trigger point with neck pain 04/12/2012   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   Diabetic peripheral neuropathy ([place]) 12/09/2011   Chronic urticaria 05/27/2011   Sinus tachycardia ([place]) 05/27/2011   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 05/20/2011   Type II diabetes mellitus with complication ([place]) 62/83/1517   Hypertension associated with diabetes ([place]) 09/28/2006    Conditions to be addressed/monitored:CHF and Covid   Care Plan : RN Case Manager  Updates made by Lazaro Arms, RN since 05/07/2021 12:00 AM     Problem: Symptom Exacerbation (Heart Failure)      Long-Range Goal: Patient to manage and monitor signs and symptoms of Heart Failure   Start Date: 11/03/2020  Expected End Date: 05/31/2021  Priority: High  Note:  Current Barriers:  Knowledge deficit related to basic heart failure pathophysiology and self care management  Case Manager Clinical Goal(S):  patient will verbalize understanding of Heart Failure Action Plan and when to call doctor  Interventions:  Basic overview and discussion of  Heart Failure reviewed Provided verbal education on low sodium diet Provided education on low sodium diet in My Chart Reviewed Heart Failure Action Plan  Advised patient to weigh each morning after emptying bladder Discussed importance of daily weight and advised patient to weigh and record daily- Reviewed role of diuretics in prevention of fluid overload and management of heart failure- 05/07/21:  patient states that she is taking her medications.   She denies any chest painor swelling.   I spoke with  Veronica Shaw, and she went to her appointment today to discuss her Diabetes. The physician checked her A1c, which was  7.4, and her weight was 130 lbs. She had her questions answered and had no medication changes.    Patient Goals/Self Care Activities:  Takes Heart Failure Medications as prescribed Weighs daily and record (notifying MD of 3 lb weight gain over night or 5 lb in a week) Verbalizes understanding of and follows CHF Action Plan Adheres to low sodium diet     Problem: Disease Self-Management (Covid)      Goal: The patient will follow instructions given to maintain health and prevent futher spread of illness   Start Date: 04/23/2021  Expected End Date: 04/30/2021  Priority: High  Note:   Current Barriers:  Chronic Disease Management support and education needs related to Covid  RNCM Clinical Goal(s):  Patient will verbalize basic understanding of Covid disease process and self health management plan provided by ER  through collaboration with RN Care manager, provider, and care team.   Interventions: 1:1 collaboration with primary care provider regarding development and update of comprehensive plan of care as evidenced by provider attestation and co-signature Inter-disciplinary care team collaboration (see longitudinal plan of care) Evaluation of current treatment plan related to  self management and patient's adherence to plan as established by provider   COVID:  (Status: New goal. Condition stable.) Provided education to patient to enhance basic understanding of COVID-19 as a viral disease, measures to prevent exposure, signs and symptoms, recommended vaccine schedule, when to contact provider;,  Provided assistance to patient as requested to obtain appointment for COVID vaccine; the patient has had two vaccine shots and interested in getting another.  She stated that she has already called the office and left a message about getting the next vaccine.  Counseled on importance  of regular laboratory monitoring as prescribed; Reminded patient of instruction given by the hospital to follow: 05/07/21:  She was tired and wanted to sleep, so the call was short. She states that she is breathing better from the episode with covid, but she still has no taste or smell. Her energy is still low. She was able to obtain her cough medicine to take at night. She will follow up with her PCP at the end of the month.     Patient Goals/Self-Care Activities: Patient will self administer medications as prescribed as evidenced by self report/primary caregiver report  Patient will attend all scheduled provider appointments as evidenced by clinician review of documented attendance to scheduled appointments and patient/caregiver report Patient will call pharmacy for medication refills as evidenced by patient report and review of pharmacy fill history as appropriate Patient will call provider office for new concerns or questions as evidenced by  review of documented incoming telephone call notes and patient report       Lazaro Arms RN, BSN, [facility] Care Management Coordinator [place] Phone: [phone] I Fax: [phone]

## 2021-05-07 NOTE — Assessment & Plan Note (Signed)
Patient says that she has lost 7 pounds since COVID infection 9/21.  Says she has noticed taste in food.  Likely secondary to decreased intake during infection. -Follow-up on weight at next visit

## 2021-05-07 NOTE — Progress Notes (Signed)
LMOM for patient to call office back. Call back information provided.

## 2021-05-07 NOTE — Assessment & Plan Note (Signed)
Patient feels that she has been having increased dizziness for a couple of months.  Likely secondary to Parkinson's or hypotension. -Advised patient to monitor and document blood pressures and pulses for next visit with PCP for possible adjustment to medication

## 2021-05-07 NOTE — Patient Instructions (Signed)
Visit Information  Ms. Thelia Tanksley  it was nice speaking with you. Please call me directly [phone] if you have questions about the goals we discussed.   Goals Addressed             This Visit's Progress    Track and Manage Fluids and Swelling       Timeframe:  Long-Range Goal Priority:  High Start Date:   11/03/20                        Expected End Date:  06/30/21  - call office if I gain more than 2 pounds in one day or 5 pounds in one week - keep legs up while sitting - track weight in diary - use salt in moderation - watch for swelling in feet, ankles and legs every day - weigh myself daily    Why is this important?   It is important to check your weight daily and watch how much salt and liquids you have.  It will help you to manage your heart failure     Track and Manage Symptoms       Timeframe:  Long-Range Goal Priority:  High Start Date:11/03/20                            Expected End Date: 11/30/222                   - develop a rescue plan - follow rescue plan if symptoms flare-up - know when to call the doctor - track symptoms and what helps feel better or worse    Why is this important?   You will be able to handle your symptoms better if you keep track of them.  Making some simple changes to your lifestyle will help.  Eating healthy is one thing you can do to take good care of yourself.    Notes:         The patient verbalized understanding of instructions, educational materials, and care plan provided today and declined offer to receive copy of patient instructions, educational materials, and care plan.   Follow up Plan: Patient would like continued follow-up.  CCM RNCM will outreach the patient within the next 4 weeks  Patient will call office if needed prior to next encounter  Lazaro Arms, RN  [phone]

## 2021-05-07 NOTE — Patient Instructions (Addendum)
It was great to see you! Thank you for allowing me to participate in your care!   I recommend that you always bring your medications to each appointment as this makes it easy to ensure we are on the correct medications and helps [place] not miss when refills are needed.  Our plans for today:  - Your A1c was 7.4 today with your goal being <8 and it is okay to be on metformin with your kidneys currently because you have been on this for a while. Continue taking the 2 tablets of metformin everyday. Please have your eye doctor send over their records for your exam this year.  - Please monitor you Blood Pressure and Pulse for a couple of weeks and record this and follow up with your PCP Dr. Susa Simmonds for possible medication changes - Your balance issues could be due to your parkinson's or blood pressures. We will watch this and follow up in 2-4 weeks  -The urination is likely due to taking your lasix for your heart, please let us know if you develop any burning or pain when urinating -We will follow up on your weight at the next visit with your PCP since you were down 7 pounds it was likely due to not eating as much during Covid infection  Take care and seek immediate care sooner if you develop any concerns. Please remember to show up 15 minutes before your scheduled appointment time!  Gerrit Heck, MD [place]

## 2021-05-07 NOTE — Assessment & Plan Note (Signed)
Had no lows in blood sugar.  A1c today was 7.4.  Patient was concerned about metformin use with kidneys but shared she has been on this for a while and likely okay. -Continue metformin 1000 mg daily -Continue losartan -Follow-up on eye examination records

## 2021-05-07 NOTE — Assessment & Plan Note (Signed)
Patient had frequent urination which she says has been chronic due to her lasix. Denies dysuria. -advised patient to call if experiencing dysuria, abdominal pain, fevers to obtain UA

## 2021-05-07 NOTE — Telephone Encounter (Signed)
ATC Patient.  LM to call back. 

## 2021-05-10 ENCOUNTER — Ambulatory Visit (INDEPENDENT_AMBULATORY_CARE_PROVIDER_SITE_OTHER): Payer: Medicare Other

## 2021-05-10 DIAGNOSIS — I5022 Chronic systolic (congestive) heart failure: Secondary | ICD-10-CM

## 2021-05-10 DIAGNOSIS — Z95810 Presence of automatic (implantable) cardiac defibrillator: Secondary | ICD-10-CM | POA: Diagnosis not present

## 2021-05-12 NOTE — Telephone Encounter (Signed)
Called and spoke with Patient.  Patient scheduled consult 06/01/21 at 0900, with Dr. Vaughan Browner.  Nothing further at this time.

## 2021-05-14 NOTE — Progress Notes (Signed)
EPIC Encounter for ICM Monitoring  Patient Name: Jessilyn Catino is a 70 y.o. female Date: 05/14/2021 Primary Care Physican: Lyndee Hensen, DO Primary Cardiologist: Cooper/Weaver PA Electrophysiologist: Lovena Le 05/14/2021 Weight: 130 lbs                                       Spoke with patient and heart failure questions reviewed.  Pt asymptomatic for fluid accumulation.   She had COVID x 3 weeks which correlate with decreased impedance.    OptiVol Thoracic impedance normal fluid levels but was suggesting possible fluid accumulation starting 9/20-10/1.   Prescribed: Furosemide 40 mg Take 1 tablet (40 mg total) by mouth daily.     Labs: 04/20/2021 Creatinine 1.24, BUN 13, Potassium 4.6, Sodium 140, GFR 46 01/22/2021 Creatinine 1.50, BUN 29, Potassium 4.6, Sodium 144 01/18/2021 Creatinine 1.39, BUN 23, Potassium 4.6, Sodium 137, GFR 41  01/01/2021 Creatinine 1.72, BUN 24, Potassium 4.1, Sodium 133, GFR 31  11/04/2020 Creatinine 1.20, BUN 25, Potassium 5.0, Sodium 138, GFR 48 10/18/2020 Creatinine 1.04, BUN 19, Potassium 4.0, Sodium 136 10/17/2020 Creatinine 1.09, BUN 19, Potassium 4.4, Sodium 137  A complete set of results can be found in Results Review.   Recommendations:  No changes and encouraged to call if experiencing any fluid symptoms.   Follow-up plan: ICM clinic phone appointment on 06/14/2021.   91 day device clinic remote transmission 05/27/2021.     EP/Cardiology Office Visits:   Recalls 01/17/2022 for [name].     Copy of ICM check sent to Dr. Lovena Le.    3 month ICM trend: 05/10/2021.    1 Year ICM trend:       [name], RN 05/14/2021 12:28 PM

## 2021-05-17 ENCOUNTER — Other Ambulatory Visit: Payer: Self-pay | Admitting: *Deleted

## 2021-05-17 DIAGNOSIS — J4530 Mild persistent asthma, uncomplicated: Secondary | ICD-10-CM

## 2021-05-18 ENCOUNTER — Other Ambulatory Visit: Payer: Self-pay | Admitting: Cardiovascular Disease

## 2021-05-19 ENCOUNTER — Telehealth: Payer: Self-pay | Admitting: Pulmonary Disease

## 2021-05-19 NOTE — Telephone Encounter (Signed)
Patient is former Dr. Lake Bells  patient referred by Judson Roch, NP to Dr. Vaughan Browner.  Patient scheduled 06/01/21 for asthma consult with Dr. Vaughan Browner. Patient does not have a recent Ige. I spoke to Patient and she stated she would come have requested asthma labs before consult.  Ige and cbc ordered.

## 2021-05-27 ENCOUNTER — Other Ambulatory Visit (INDEPENDENT_AMBULATORY_CARE_PROVIDER_SITE_OTHER): Payer: Medicare Other

## 2021-05-27 ENCOUNTER — Ambulatory Visit (INDEPENDENT_AMBULATORY_CARE_PROVIDER_SITE_OTHER): Payer: Medicare Other

## 2021-05-27 DIAGNOSIS — I5022 Chronic systolic (congestive) heart failure: Secondary | ICD-10-CM

## 2021-05-27 DIAGNOSIS — J4530 Mild persistent asthma, uncomplicated: Secondary | ICD-10-CM | POA: Diagnosis not present

## 2021-05-27 LAB — CBC WITH DIFFERENTIAL/PLATELET
Basophils Absolute: 0.1 10*3/uL (ref 0.0–0.1)
Basophils Relative: 1 % (ref 0.0–3.0)
Eosinophils Absolute: 0.3 10*3/uL (ref 0.0–0.7)
Eosinophils Relative: 5.2 % — ABNORMAL HIGH (ref 0.0–5.0)
HCT: 38.8 % (ref 36.0–46.0)
Hemoglobin: 13 g/dL (ref 12.0–15.0)
Lymphocytes Relative: 26.9 % (ref 12.0–46.0)
Lymphs Abs: 1.7 10*3/uL (ref 0.7–4.0)
MCHC: 33.5 g/dL (ref 30.0–36.0)
MCV: 90.3 fl (ref 78.0–100.0)
Monocytes Absolute: 0.7 10*3/uL (ref 0.1–1.0)
Monocytes Relative: 10.9 % (ref 3.0–12.0)
Neutro Abs: 3.4 10*3/uL (ref 1.4–7.7)
Neutrophils Relative %: 56 % (ref 43.0–77.0)
Platelets: 240 10*3/uL (ref 150.0–400.0)
RBC: 4.3 Mil/uL (ref 3.87–5.11)
RDW: 13 % (ref 11.5–15.5)
WBC: 6.1 10*3/uL (ref 4.0–10.5)

## 2021-05-28 LAB — CUP PACEART REMOTE DEVICE CHECK
Battery Remaining Longevity: 29 mo
Battery Voltage: 2.92 V
Brady Statistic AP VP Percent: 0 %
Brady Statistic AP VS Percent: 0 %
Brady Statistic AS VP Percent: 0.03 %
Brady Statistic AS VS Percent: 99.96 %
Brady Statistic RA Percent Paced: 0 %
Brady Statistic RV Percent Paced: 0.04 %
Date Time Interrogation Session: 20221028063206
HighPow Impedance: 74 Ohm
Implantable Lead Implant Date: 20150427
Implantable Lead Implant Date: 20150427
Implantable Lead Location: 753859
Implantable Lead Location: 753860
Implantable Lead Model: 5076
Implantable Lead Model: 6935
Implantable Pulse Generator Implant Date: 20150427
Lead Channel Impedance Value: 361 Ohm
Lead Channel Impedance Value: 4047 Ohm
Lead Channel Impedance Value: 4047 Ohm
Lead Channel Impedance Value: 4047 Ohm
Lead Channel Impedance Value: 551 Ohm
Lead Channel Impedance Value: 608 Ohm
Lead Channel Pacing Threshold Amplitude: 0.625 V
Lead Channel Pacing Threshold Amplitude: 0.625 V
Lead Channel Pacing Threshold Pulse Width: 0.4 ms
Lead Channel Pacing Threshold Pulse Width: 0.4 ms
Lead Channel Sensing Intrinsic Amplitude: 11.375 mV
Lead Channel Sensing Intrinsic Amplitude: 11.375 mV
Lead Channel Sensing Intrinsic Amplitude: 2.375 mV
Lead Channel Sensing Intrinsic Amplitude: 2.375 mV
Lead Channel Setting Pacing Amplitude: 2 V
Lead Channel Setting Pacing Amplitude: 2.5 V
Lead Channel Setting Pacing Pulse Width: 0.4 ms
Lead Channel Setting Sensing Sensitivity: 0.3 mV

## 2021-05-28 LAB — IGE: IgE (Immunoglobulin E), Serum: 552 kU/L — ABNORMAL HIGH (ref <=114)

## 2021-06-01 ENCOUNTER — Other Ambulatory Visit: Payer: Self-pay

## 2021-06-01 ENCOUNTER — Encounter: Payer: Self-pay | Admitting: Pulmonary Disease

## 2021-06-01 ENCOUNTER — Ambulatory Visit (INDEPENDENT_AMBULATORY_CARE_PROVIDER_SITE_OTHER): Payer: Medicare Other | Admitting: Pulmonary Disease

## 2021-06-01 VITALS — BP 120/60 | HR 87 | Ht 61.5 in | Wt 132.6 lb

## 2021-06-01 DIAGNOSIS — U099 Post covid-19 condition, unspecified: Secondary | ICD-10-CM

## 2021-06-01 DIAGNOSIS — J4530 Mild persistent asthma, uncomplicated: Secondary | ICD-10-CM | POA: Diagnosis not present

## 2021-06-01 MED ORDER — MONTELUKAST SODIUM 10 MG PO TABS: 10.0000 mg | ORAL_TABLET | Freq: Every day | ORAL | 2 refills | Status: AC

## 2021-06-01 MED ORDER — MONTELUKAST SODIUM 10 MG PO TABS: 10.0000 mg | ORAL_TABLET | Freq: Every day | ORAL | 5 refills | Status: DC

## 2021-06-01 NOTE — Patient Instructions (Signed)
I am glad you recovered from [place] and starting to feel better Chest x-ray a few months ago shows clear lungs Continue Symbicort We will add Singulair daily  PFTs and return to clinic in 3 months

## 2021-06-01 NOTE — Progress Notes (Signed)
Egan Sahlin    [id number]    10/25/1950  Primary Care Physician:Brimage, Ronnette Juniper, DO  Referring Physician: Lyndee Hensen, [place],  [place] [address]  Chief complaint: Consult for asthma, post COVID-32   HPI: 70 year old with history of cardiomyopathy, diabetes, Parkinson's disease, hypertension, hyperlipidemia She is followed in pulmonary clinic for asthma, maintained on Symbicort Has poor control of symptoms with daily nighttime awakenings, use of rescue medication, dyspnea on exertion with wheezing  She had COVID-19 in September 2022 treated with prednisone and antibiotics as an outpatient.  She was ill for 2 weeks but has gradually recovered.  Chest x-ray at that time did not show any acute lung abnormalities.  She recently started using CBD which has improved her symptoms  Pets: 2 dogs Occupation: Retired Quarry manager Exposures: No mold, hot tub, Customer service manager.  No feather pillows or comforters Smoking history: Non-smoker Travel history: Originally from [place].  No significant recent travel Relevant family history: History of emphysema and asthma in her parents  Outpatient Encounter Medications as of 06/01/2021  Medication Sig   acetaminophen (TYLENOL) 500 MG tablet Take 2 tablets (1,000 mg total) by mouth every 8 (eight) hours as needed.   APPLE CIDER VINEGAR PO Take 1 capsule by mouth daily.   Biotin 5000 MCG CAPS Take 1 tablet by mouth daily.    budesonide-formoterol (SYMBICORT) 160-4.5 MCG/ACT inhaler Inhale 2 puffs into the lungs 2 (two) times daily.   carbidopa-levodopa (SINEMET IR) 25-100 MG tablet Take 1 tablet by mouth 3 (three) times daily.   diclofenac Sodium (VOLTAREN) 1 % GEL Apply 4 g topically 4 (four) times daily.   famotidine (PEPCID) 20 MG tablet TAKE 1 TABLET TWICE DAILY   furosemide (LASIX) 20 MG tablet Take 20 mg by mouth daily.   levalbuterol (XOPENEX) 0.63 MG/3ML nebulizer solution USE 1 VIAL IN NEBULIZER EVERY 4 TO 6 HOURS AS  NEEDED FOR WHEEZING AND FOR SHORTNESS OF BREATH   losartan (COZAAR) 25 MG tablet TAKE 1 TABLET EVERY DAY   meclizine (ANTIVERT) 12.5 MG tablet Take 1 tablet (12.5 mg total) by mouth daily as needed for dizziness. Should not be long term.   melatonin 5 MG TABS Take 5 mg by mouth at bedtime.   metFORMIN (GLUCOPHAGE) 500 MG tablet Take 2 tablets (1,000 mg total) by mouth 2 (two) times daily with a meal.   metoprolol succinate (TOPROL-XL) 50 MG 24 hr tablet Take 1 tablet (50 mg total) by mouth 2 (two) times daily. Please make yearly appt with Dr. Lovena Le for July 2022 for future refills. Thank you 1st attempt   OXYGEN Inhale into the lungs at bedtime. Inhale 2L into lungs   potassium chloride (KLOR-CON) 10 MEQ tablet Take 10 mEq by mouth daily as needed (low potassium).   rosuvastatin (CRESTOR) 10 MG tablet TAKE 1 TABLET BY MOUTH TWICE A WEEK   tiZANidine (ZANAFLEX) 2 MG tablet TAKE 1 TABLET BY MOUTH EVERY 8 HOURS AS NEEDED FOR MUSCLE SPASM   Ubiquinone (ULTRA COQ10 PO) Take 1 tablet by mouth 3 (three) times a week.   fluticasone (FLONASE SENSIMIST) 27.5 MCG/SPRAY nasal spray Place 2 sprays into the nose daily. (Patient not taking: Reported on 06/01/2021)   spironolactone (ALDACTONE) 25 MG tablet TAKE 1/2 TABLET EVERY DAY (Patient not taking: Reported on 06/01/2021)   [DISCONTINUED] doxycycline (VIBRA-TABS) 100 MG tablet Take 1 tablet (100 mg total) by mouth 2 (two) times daily. (Patient not taking: Reported on 05/07/2021)   [  DISCONTINUED] fluconazole (DIFLUCAN) 150 MG tablet Take 1 tablet (150 mg total) by mouth every 3 (three) days. Take one tablet, if still symptoms of vaginal itching, take 2nd in 72 hours (Patient not taking: Reported on 06/01/2021)   [DISCONTINUED] HYDROcodone bit-homatropine (HYCODAN) 5-1.5 MG/5ML syrup Take 5 mLs by mouth at bedtime as needed for cough. (Patient not taking: Reported on 05/07/2021)   [DISCONTINUED] predniSONE (DELTASONE) 10 MG tablet Take 4 tabs x 2 days, then 3 x 2d,  then 2 x 2d, then 1 x 2d and STOP (Patient not taking: Reported on 05/07/2021)   No facility-administered encounter medications on file as of 06/01/2021.    Allergies as of 06/01/2021 - Review Complete 04/28/2021  Allergen Reaction Noted   Aspirin Swelling and Other (See Comments) 04/20/2013   Penicillins Shortness Of Breath and Rash 05/17/2007   Prempro [conj estrog-medroxyprogest ace] Shortness Of Breath 02/13/2012   Ace inhibitors Cough 06/18/2007   Simvastatin Other (See Comments) and Rash 12/09/2011    Past Medical History:  Diagnosis Date   Acute on chronic systolic CHF (congestive heart failure), NYHA class 4 ([place]) 10/19/2020   Asthma    Back pain 09/18/2019   Cervical disc disorder with radiculopathy of cervical region 3/50/0938   Chronic systolic CHF (congestive heart failure) ([place])    a. cMRI 4/15: EF 34% and findings - c/w NICM, normal RV size and function (RVEF 61%), Mild MR // b. Echo 2/15:  EF 30-35%, diff HK, ant-sept AK, Gr 2 DD, mild MR, trivial TR  //  c. Echo 5/17: EF 20-25%, severe diffuse HK, marked systolic dyssynchrony, grade 1 diastolic dysfunction, mild MR  //  d. [place] 5/17: Fick CO 2.9, RVSP 19, PASP 15, PW mean 2, low filing pressures and preserved CO    Cognitive impairment 10/19/2017   MOCA was administered with a score of 23/30   Diabetes mellitus    Diabetic peripheral neuropathy ([place]) 12/09/2011   Dyspnea 09/19/2012   Flu 10/17/2017   Gastritis    History of echocardiogram    Echo 6/18: EF 30-35, diffuse HK, grade 1 diastolic dysfunction, trivial MR, mild LAE, mild TR, no pericardial effusion   History of nuclear stress test    Myoview 5/18: EF 49, no ischemia, inferoseptal defect c/w LBBB artifact (intermediate risk due to EF < 50).   HTN (hypertension)    Hyperlipidemia    Hyperlipidemia associated with type 2 diabetes mellitus ([place]) 03/05/2019   Hypertension associated with diabetes ([place]) 09/28/2006   Qualifier: Diagnosis of  By: [name]      Major depression 03/31/2013   Melena 08/21/2020   NICM (nonischemic cardiomyopathy) ([place])    a. Nuclear 5/13: Normal stress nuclear study. LV Ejection Fraction: 58%  //  b. LHC 10/14: Minor luminal irregularity in prox LAD, EF 35%    Parkinson disease (HCC)    Plantar fasciitis    Plantar fasciitis, bilateral 06/15/2012   Rosacea 05/29/2009   Qualifier: Diagnosis of  By: [name] MD, [name] hearing loss (SNHL), bilateral 01/04/2017   Sleep apnea    was retested and no longer had it and so d/c CPAP   Syncope    Thoracic or lumbosacral neuritis or radiculitis, unspecified 07/03/2013   Tinnitus, bilateral 01/04/2017   Type II diabetes mellitus with complication ([place]) 1/82/9937   Diabetic eye exam done by [facility]: Dr. Prudencio Burly on 11/08/13: no diabetic retinopathy. Repeat in 1 year     Urticaria     Past  Surgical History:  Procedure Laterality Date   BREAST EXCISIONAL BIOPSY Left 1970   benign cyst   BREAST SURGERY Left    BUNIONECTOMY     CARDIAC CATHETERIZATION N/A 12/16/2015   Procedure: Right Heart Cath;  Surgeon: Sherren Mocha, MD;  Location: [facility];  Service: Cardiovascular;  Laterality: N/A;   CHOLECYSTECTOMY     eye lid surgery Bilateral 05/09/2019   IMPLANTABLE CARDIOVERTER DEFIBRILLATOR IMPLANT  11-25-13   MDT dual chamber ICD implanted by [name] for primary prevention   IMPLANTABLE CARDIOVERTER DEFIBRILLATOR IMPLANT N/A 11/25/2013   Procedure: IMPLANTABLE CARDIOVERTER DEFIBRILLATOR IMPLANT;  Surgeon: [name], MD;  Location: [facility] CATH LAB;  Service: Cardiovascular;  Laterality: N/A;   LEFT AND RIGHT HEART CATHETERIZATION WITH CORONARY ANGIOGRAM N/A 05/31/2013   Procedure: LEFT AND RIGHT HEART CATHETERIZATION WITH CORONARY ANGIOGRAM;  Surgeon: [name], MD;  Location: [facility] CATH LAB;  Service: Cardiovascular;  Laterality: N/A;   TONSILLECTOMY     TUBAL LIGATION      Family History  Problem Relation Age of Onset   Coronary  artery disease Father        Died age 2   Heart attack Father    Diabetes Father    Coronary artery disease Mother        Died age 39   Heart attack Mother    Diabetes Mother    Parkinson's disease Sister    Heart disease Sister    Hepatitis C Sister    Diabetes Sister    Diabetes Son 35       T1DM   Healthy Daughter    Diabetes Sister    Heart disease Sister    Diabetes Sister    Heart disease Sister    Breast cancer Neg Hx     Social History   Socioeconomic History   Marital status: Married    Spouse name: [name]    Number of children: 2   Years of education: 12   Highest education level: 12th grade  Occupational History   Occupation: retired    Comment: CNA  Tobacco Use   Smoking status: Never   Smokeless tobacco: Never   Tobacco comments:    exposed to smoke during child hood (parents)  Vaping Use   Vaping Use: Never used  Substance and Sexual Activity   Alcohol use: No    Alcohol/week: 0.0 standard drinks   Drug use: No   Sexual activity: Yes    Birth control/protection: Post-menopausal  Other Topics Concern   Not on file  Social History Narrative   Patient lives with her husband [name].   Patient enjoys spending time with her family, cooking, and her 2 dogs.    Patient has one daughter who lives locally, and one son who lives in [place].   Social Determinants of Health   Financial Resource Strain: Low Risk    Difficulty of Paying Living Expenses: Not hard at all  Food Insecurity: No Food Insecurity   Worried About Charity fundraiser in the Last Year: Never true   [place] in the Last Year: Never true  Transportation Needs: No Transportation Needs   Lack of Transportation (Medical): No   Lack of Transportation (Non-Medical): No  Physical Activity: Sufficiently Active   Days of Exercise per Week: 7 days   Minutes of Exercise per Session: 30 min  Stress: No Stress Concern Present   Feeling of Stress : Not at all  Social Connections:  Moderately Integrated   Frequency of Communication with Friends and Family: More than three times a week   Frequency of Social Gatherings with Friends and Family: More than three times a week   Attends Religious Services: More than 4 times per year   Active Member of [employer] or Organizations: No   Attends Archivist Meetings: Never   Marital Status: Married  Human resources officer Violence: Not At Risk   Fear of Current or Ex-Partner: No   Emotionally Abused: No   Physically Abused: No   Sexually Abused: No    Review of systems: Review of Systems  Constitutional: Negative for fever and chills.  HENT: Negative.   Eyes: Negative for blurred vision.  Respiratory: as per HPI  Cardiovascular: Negative for chest pain and palpitations.  Gastrointestinal: Negative for vomiting, diarrhea, blood per rectum. Genitourinary: Negative for dysuria, urgency, frequency and hematuria.  Musculoskeletal: Negative for myalgias, back pain and joint pain.  Skin: Negative for itching and rash.  Neurological: Negative for dizziness, tremors, focal weakness, seizures and loss of consciousness.  Endo/Heme/Allergies: Negative for environmental allergies.  Psychiatric/Behavioral: Negative for depression, suicidal ideas and hallucinations.  All other systems reviewed and are negative.  Physical Exam: Blood pressure 120/60, pulse 87, height 5' 1.5" (1.562 m), weight 132 lb 9.6 oz (60.1 kg), last menstrual period 08/01/2000, SpO2 98 %. Gen:      No acute distress HEENT:  EOMI, sclera anicteric Neck:     No masses; no thyromegaly Lungs:    Clear to auscultation bilaterally; normal respiratory effort CV:         Regular rate and rhythm; no murmurs Abd:      + bowel sounds; soft, non-tender; no palpable masses, no distension Ext:    No edema; adequate peripheral perfusion Skin:      Warm and dry; no rash Neuro: alert and oriented x 3 Psych: normal mood and affect  Data Reviewed: Imaging: CTA 10/17/2020-no  pulmonary embolism, mild scarring/atelectasis in the lower lobes CXR 04/28/21-no acute cardiopulmonary abnormality I have reviewed the images personally.  PFTs: 02/17/2015 FVC 2.49 [9%], FEV1 1.97 [93%], F/F 79, TLC 4.32 [93%], DLCO 21.77 (7%) Minimal obstructive airway disease with bronchodilator respiratory  Labs: CBC 05/27/2021-WBC 6.1, eos 5.2%, absolute eosinophil count 317 IgE 05/27/2021-552  Assessment:  Asthma She has T2 high asthma with elevated eosinophils and IgE ACT score is 12 indicating poor control of symptoms Will continue Symbicort, add Singulair PFTs and follow-up in 3 months  If she continues to have persistent symptoms then consider addition of LAMA, Biologics  Post COVID-19 She is recovering well from recent COVID-19 infection, chest x-ray reviewed with no acute abnormalities Review PFTs for any reduction in lung volumes or diffusion capacity  Plan/Recommendations: Continue Symbicort Add Singulair PFTs  Marshell Garfinkel MD [facility] 06/01/2021, 9:18 AM  CC: Lyndee Hensen, DO

## 2021-06-03 NOTE — Progress Notes (Signed)
Remote ICD transmission.   

## 2021-06-09 ENCOUNTER — Telehealth: Payer: Self-pay | Admitting: Pharmacy Technician

## 2021-06-09 ENCOUNTER — Ambulatory Visit: Payer: Medicare Other

## 2021-06-09 DIAGNOSIS — Z596 Low income: Secondary | ICD-10-CM

## 2021-06-09 NOTE — Progress Notes (Signed)
[facility])                                            [place] Team    06/09/2021  Veronica Shaw 03/04/1951 [id number]  FOR 2023 RE ENROLLMENT                                      Medication Assistance Referral  Referral From: [place]  Medication/Company: Symbicort / [place] Patient application portion:  N/A patient should automatically be re enrolled as she has Med D Provider application portion: Faxed  to Eric Form, NP Provider address/fax verified via: Office website    [name]. Orlean [place], [place]  [phone]

## 2021-06-09 NOTE — Chronic Care Management (AMB) (Signed)
Chronic Care Management   CCM RN Visit Note  06/09/2021 Name: Veronica Shaw MRN: [id number] DOB: 15-Nov-1950  Subjective: Veronica Shaw is a 70 y.o. year old female who is a primary care patient of Lyndee Hensen, DO. The care management team was consulted for assistance with disease management and care coordination needs.    Engaged with patient by telephone for follow up visit in response to provider referral for case management and/or care coordination services.   Consent to Services:  The patient was given information about Chronic Care Management services, agreed to services, and gave verbal consent prior to initiation of services.  Please see initial visit note for detailed documentation.   Patient agreed to services and verbal consent obtained.    Assessment:  The patient  is making progress with CHF  Patient is currently experiencing difficulty with Pain.. See Care Plan below for interventions and patient self-care actives. Follow up Plan: Patient would like continued follow-up.  CCM RNCM will outreach the patient within the next 4 weeks.  Patient will call office if needed prior to next encounter : Review of patient past medical history, allergies, medications, health status, including review of consultants reports, laboratory and other test data, was performed as part of comprehensive evaluation and provision of chronic care management services.   SDOH (Social Determinants of Health) assessments and interventions performed:    CCM Care Plan  Allergies  Allergen Reactions   Aspirin Swelling and Other (See Comments)    Other reaction(s): Other (See Comments) Causes nose bleeds *ONLY THE COATED ASA* Causes nose bleeds *ONLY THE COATED ASA*   Penicillins Shortness Of Breath and Rash    Shortness of Breath - Throat felt like it was closing.    Prempro [Conj Estrog-Medroxyprogest Ace] Shortness Of Breath    Throat swelling Throat swelling   Ace Inhibitors Cough    Simvastatin Other (See Comments) and Rash    Muscle aches    Outpatient Encounter Medications as of 06/09/2021  Medication Sig   acetaminophen (TYLENOL) 500 MG tablet Take 2 tablets (1,000 mg total) by mouth every 8 (eight) hours as needed.   APPLE CIDER VINEGAR PO Take 1 capsule by mouth daily.   Biotin 5000 MCG CAPS Take 1 tablet by mouth daily.    budesonide-formoterol (SYMBICORT) 160-4.5 MCG/ACT inhaler Inhale 2 puffs into the lungs 2 (two) times daily.   carbidopa-levodopa (SINEMET IR) 25-100 MG tablet Take 1 tablet by mouth 3 (three) times daily.   diclofenac Sodium (VOLTAREN) 1 % GEL Apply 4 g topically 4 (four) times daily.   famotidine (PEPCID) 20 MG tablet TAKE 1 TABLET TWICE DAILY   fluticasone (FLONASE SENSIMIST) 27.5 MCG/SPRAY nasal spray Place 2 sprays into the nose daily. (Patient not taking: Reported on 06/01/2021)   furosemide (LASIX) 20 MG tablet Take 20 mg by mouth daily.   levalbuterol (XOPENEX) 0.63 MG/3ML nebulizer solution USE 1 VIAL IN NEBULIZER EVERY 4 TO 6 HOURS AS NEEDED FOR WHEEZING AND FOR SHORTNESS OF BREATH   losartan (COZAAR) 25 MG tablet TAKE 1 TABLET EVERY DAY   meclizine (ANTIVERT) 12.5 MG tablet Take 1 tablet (12.5 mg total) by mouth daily as needed for dizziness. Should not be long term.   melatonin 5 MG TABS Take 5 mg by mouth at bedtime.   metFORMIN (GLUCOPHAGE) 500 MG tablet Take 2 tablets (1,000 mg total) by mouth 2 (two) times daily with a meal.   metoprolol succinate (TOPROL-XL) 50 MG 24 hr tablet Take 1 tablet (  50 mg total) by mouth 2 (two) times daily. Please make yearly appt with Dr. Lovena Le for July 2022 for future refills. Thank you 1st attempt   montelukast (SINGULAIR) 10 MG tablet Take 1 tablet (10 mg total) by mouth at bedtime.   OXYGEN Inhale into the lungs at bedtime. Inhale 2L into lungs   potassium chloride (KLOR-CON) 10 MEQ tablet Take 10 mEq by mouth daily as needed (low potassium).   rosuvastatin (CRESTOR) 10 MG tablet TAKE 1 TABLET BY  MOUTH TWICE A WEEK   spironolactone (ALDACTONE) 25 MG tablet TAKE 1/2 TABLET EVERY DAY (Patient not taking: Reported on 06/01/2021)   tiZANidine (ZANAFLEX) 2 MG tablet TAKE 1 TABLET BY MOUTH EVERY 8 HOURS AS NEEDED FOR MUSCLE SPASM   Ubiquinone (ULTRA COQ10 PO) Take 1 tablet by mouth 3 (three) times a week.   No facility-administered encounter medications on file as of 06/09/2021.    Patient Active Problem List   Diagnosis Date Noted   Weight loss 05/07/2021   UTI (urinary tract infection) 01/24/2021   Acute on chronic systolic CHF (congestive heart failure), NYHA class 4 ([place]) 10/19/2020   Chronic HFrEF (heart failure with reduced ejection fraction) ([place])    Cervical dystonia 10/07/2020   Advanced care planning/counseling discussion 08/21/2020   Dementia without behavioral disturbance ([place]) 08/21/2020   Vision impairment, Right Eye 08/21/2020   Chronic kidney disease, stage 3a ([place]) 08/18/2020   Frequent urination 07/30/2020   Body mass index (BMI) 26.0-26.9, adult 01/29/2020   Chronic rhinitis 12/18/2019   Chronic respiratory failure with hypoxia ([place]) 12/18/2019   Hyperlipidemia associated with type 2 diabetes mellitus ([place]) 03/05/2019   Cervical radiculopathy 10/24/2018   Falls frequently    Asthma 08/03/2017   Osteopenia 07/10/2015   Mild persistent asthma in adult without complication 50/93/2671   NICM (nonischemic cardiomyopathy) ([place]) 04/23/2014   Arthritis or polyarthritis, rheumatoid ([place]) 03/12/2014   ICD (implantable cardioverter-defibrillator), dual, in situ 03/06/2014   Heart Failure with Recovered Ejection Fraction, G2DD 05/31/2013   Parkinson disease ([place]) 05/21/2013   Vertigo 03/03/2013   Gastroesophageal reflux disease without esophagitis 09/27/2012   Trigger point with neck pain 04/12/2012   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   Diabetic peripheral neuropathy ([place]) 12/09/2011   Chronic urticaria 05/27/2011   Sinus tachycardia ([place]) 05/27/2011   Allergy  to walnuts 05/20/2011   Type II diabetes mellitus with complication ([place]) 24/58/0998   Hypertension associated with diabetes ([place]) 09/28/2006    Conditions to be addressed/monitored:CHF  Care Plan : RN Case Manager  Updates made by Lazaro Arms, RN since 06/09/2021 12:00 AM     Problem: Symptom Exacerbation (Heart Failure)      Long-Range Goal: Patient to manage and monitor signs and symptoms of Heart Failure   Start Date: 11/03/2020  Expected End Date: 05/31/2021  Priority: High  Note:   Current Barriers:  Knowledge deficit related to basic heart failure pathophysiology and self care management  Case Manager Clinical Goal(S):  patient will verbalize understanding of Heart Failure Action Plan and when to call doctor  Interventions: Goal on Track Basic overview and discussion of  Heart Failure reviewed Provided verbal education on low sodium diet Reviewed Heart Failure Action Plan  Advised patient to weigh each morning after emptying bladder Discussed importance of daily weight and advised patient to weigh and record daily- 06/09/21: Talking with [name] today, She denies having any chest pain, shortness of breath, or swelling. Her weight fluctuates between 130 to 134 lbs. She is taking  her meds as prescribed. She said she has pain in her neck, back, knees and feet. She rates that pain at 8/10. She went to the chiropractor today to see if this route could offer relief.   Patient Goals/Self Care Activities:  Takes Heart Failure Medications as prescribed Weighs daily and record (notifying MD of 3 lb weight gain over night or 5 lb in a week) Verbalizes understanding of and follows CHF Action Plan Adheres to low sodium diet     Problem: Disease Self-Management (Covid)      Goal: The patient will follow instructions given to maintain health and prevent futher spread of illness Completed 06/09/2021  Start Date: 04/23/2021  Expected End Date: 06/30/2021  Priority: High  Note:    Current Barriers:  Chronic Disease Management support and education needs related to Covid  RNCM Clinical Goal(s):  Patient will verbalize basic understanding of Covid disease process and self health management plan provided by ER  through collaboration with RN Care manager, provider, and care team.   Interventions: 1:1 collaboration with primary care provider regarding development and update of comprehensive plan of care as evidenced by provider attestation and co-signature Inter-disciplinary care team collaboration (see longitudinal plan of care) Evaluation of current treatment plan related to  self management and patient's adherence to plan as established by provider   COVID:  (Status: New goal. Met.) Provided education to patient to enhance basic understanding of COVID-19 as a viral disease, measures to prevent exposure, signs and symptoms, recommended vaccine schedule, when to contact provider;,  Provided assistance to patient as requested to obtain appointment for COVID vaccine; the patient has had two vaccine shots and interested in getting another.  She stated that she has already called the office and left a message about getting the next vaccine.  Counseled on importance of regular laboratory monitoring as prescribed; 06/09/21: Patient saw her Pulmonologist on 06/01/21, and her breathing was better. She is to continue her Symbicort inhaler, and he started her on Singulair daily. she is also using her oxygen 2 liters as prescribed. She has recovered from Covid not all of her taste and smell has returned but her appetite has picked up.    Patient Goals/Self-Care Activities: Patient will self administer medications as prescribed as evidenced by self report/primary caregiver report  Patient will attend all scheduled provider appointments as evidenced by clinician review of documented attendance to scheduled appointments and patient/caregiver report Patient will call pharmacy for  medication refills as evidenced by patient report and review of pharmacy fill history as appropriate Patient will call provider office for new concerns or questions as evidenced by review of documented incoming telephone call notes and patient report       Lazaro Arms RN, BSN, [facility] Care Management Coordinator [place] Phone: [phone] I Fax: [phone]

## 2021-06-09 NOTE — Patient Instructions (Signed)
Visit Information  [name]  it was nice speaking with you. Please call me directly [phone] if you have questions about the goals we discussed.  Patient Goals/Self Care Activities Takes Heart Failure Medications as prescribed Weighs daily and record (notifying MD of 3 lb weight gain over night or 5 lb in a week) Verbalizes understanding of and follows CHF Action Plan Adheres to low sodium diet   The patient verbalized understanding of instructions, educational materials, and care plan provided today and declined offer to receive copy of patient instructions, educational materials, and care plan.   Follow up Plan: Patient would like continued follow-up.  CCM RNCM will outreach the patient within the next 4 weeks.  Patient will call office if needed prior to next encounter  Lazaro Arms, RN  [phone]

## 2021-06-14 ENCOUNTER — Ambulatory Visit (INDEPENDENT_AMBULATORY_CARE_PROVIDER_SITE_OTHER): Payer: Medicare Other

## 2021-06-14 DIAGNOSIS — Z95810 Presence of automatic (implantable) cardiac defibrillator: Secondary | ICD-10-CM

## 2021-06-14 DIAGNOSIS — I5022 Chronic systolic (congestive) heart failure: Secondary | ICD-10-CM | POA: Diagnosis not present

## 2021-06-16 DIAGNOSIS — Z23 Encounter for immunization: Secondary | ICD-10-CM | POA: Diagnosis not present

## 2021-06-17 ENCOUNTER — Encounter: Payer: Self-pay | Admitting: Family Medicine

## 2021-06-18 NOTE — Progress Notes (Signed)
EPIC Encounter for ICM Monitoring  Patient Name: Veronica Shaw is a 70 y.o. female Date: 06/18/2021 Primary Care Physican: Lyndee Hensen, DO Primary Cardiologist: Cooper/Weaver PA Electrophysiologist: Lovena Le 05/14/2021 Weight: 130 lbs                                       Spoke with patient and heart failure questions reviewed.  Pt asymptomatic for fluid accumulation.      OptiVol Thoracic impedance normal.   Prescribed: Furosemide 40 mg Take 1 tablet (40 mg total) by mouth daily.     Labs: 04/20/2021 Creatinine 1.24, BUN 13, Potassium 4.6, Sodium 140, GFR 46 01/22/2021 Creatinine 1.50, BUN 29, Potassium 4.6, Sodium 144 01/18/2021 Creatinine 1.39, BUN 23, Potassium 4.6, Sodium 137, GFR 41  01/01/2021 Creatinine 1.72, BUN 24, Potassium 4.1, Sodium 133, GFR 31  11/04/2020 Creatinine 1.20, BUN 25, Potassium 5.0, Sodium 138, GFR 48 10/18/2020 Creatinine 1.04, BUN 19, Potassium 4.0, Sodium 136 10/17/2020 Creatinine 1.09, BUN 19, Potassium 4.4, Sodium 137  A complete set of results can be found in Results Review.   Recommendations:  No changes and encouraged to call if experiencing any fluid symptoms.   Follow-up plan: ICM clinic phone appointment on 07/19/2021.   91 day device clinic remote transmission 08/26/2021.     EP/Cardiology Office Visits:  08/20/2021 with Richardson Dopp, PA.   Recalls 01/17/2022 for [name].     Copy of ICM check sent to Dr. Lovena Le.    3 month ICM trend: 06/14/2021.    12-14 Month ICM trend:       [name], RN 06/18/2021 2:22 PM

## 2021-06-21 DIAGNOSIS — M5116 Intervertebral disc disorders with radiculopathy, lumbar region: Secondary | ICD-10-CM | POA: Diagnosis not present

## 2021-06-21 DIAGNOSIS — M9903 Segmental and somatic dysfunction of lumbar region: Secondary | ICD-10-CM | POA: Diagnosis not present

## 2021-06-21 DIAGNOSIS — M50122 Cervical disc disorder at C5-C6 level with radiculopathy: Secondary | ICD-10-CM | POA: Diagnosis not present

## 2021-06-21 DIAGNOSIS — M9901 Segmental and somatic dysfunction of cervical region: Secondary | ICD-10-CM | POA: Diagnosis not present

## 2021-06-22 ENCOUNTER — Telehealth: Payer: Self-pay | Admitting: Pharmacy Technician

## 2021-06-22 DIAGNOSIS — Z596 Low income: Secondary | ICD-10-CM

## 2021-06-22 NOTE — Progress Notes (Signed)
[facility])                                            [place] Team    06/22/2021  Veronica Shaw [birth year] [id number]  Received provider portion(s) of patient assistance application(s) for Symbicort. Faxed completed application and required documents into [employer] for 2023 renewal.    Primrose Oler P. Ellyanna Holton, [place]  [phone]

## 2021-06-28 ENCOUNTER — Encounter: Payer: Self-pay | Admitting: Internal Medicine

## 2021-06-28 ENCOUNTER — Telehealth: Payer: Self-pay

## 2021-06-28 NOTE — Telephone Encounter (Signed)
Error

## 2021-06-28 NOTE — Telephone Encounter (Signed)
Spoke with patient informed her that I had reviewed the transmission and that her device was functioning normally and her lead measurements were good. Advised patient to tell chiropractor about increase in pain in left shoulder and to follow up with her PCP, patient voiced understanding

## 2021-06-28 NOTE — Telephone Encounter (Signed)
Opening error 

## 2021-06-28 NOTE — Telephone Encounter (Signed)
The patient states she been going to the chiropractor and she has been hurting on her left shoulder. She also hurting around her ICD site. The pain radiates down to her elbow. She been feeling so tired that she can not get out of bed. I had the patient to send a manual transmission. Transmission received

## 2021-07-07 ENCOUNTER — Telehealth: Payer: Self-pay

## 2021-07-07 ENCOUNTER — Telehealth: Payer: Medicare Other

## 2021-07-07 NOTE — Telephone Encounter (Signed)
   RN Case Manager Care Management   Phone Outreach    07/07/2021 Name: Neomi Laidler MRN: [id number] DOB: [birth year]  Stefanny Karinne Schmader is a 70 y.o. year old female who is a primary care patient of Lyndee Hensen, DO .   Telephone outreach was unsuccessful A HIPPA compliant phone message was left for the patient providing contact information and requesting a return call.   Follow Up Plan: Will route chart to Care Guide to see if patient would like to reschedule phone appointment    Review of patient status, including review of consultants reports, relevant laboratory and other test results, and collaboration with appropriate care team members and the patient's provider was performed as part of comprehensive patient evaluation and provision of care management services.    Lazaro Arms RN, BSN, [facility] Care Management Coordinator [place] Phone: [phone] Fax: [phone]

## 2021-07-09 ENCOUNTER — Telehealth: Payer: Self-pay | Admitting: *Deleted

## 2021-07-09 NOTE — Chronic Care Management (AMB) (Signed)
  Care Management   Note  07/09/2021 Name: [name] MRN: [id number] DOB: 1951/03/06  Veronica Shaw is a 70 y.o. year old female who is a primary care patient of Lyndee Hensen, DO and is actively engaged with the care management team. I reached out to Terie Purser by phone today to assist with re-scheduling a follow up visit with the RN Case Manager  Follow up plan: Unsuccessful telephone outreach attempt made. A HIPAA compliant phone message was left for the patient providing contact information and requesting a return call.  The care management team will reach out to the patient again over the next 7 days.  If patient returns call to provider office, please advise to call Guadalupe Guerra  at [phone].  [place] Management  Direct Dial: [phone]

## 2021-07-16 ENCOUNTER — Telehealth: Payer: Self-pay | Admitting: Pharmacy Technician

## 2021-07-16 DIAGNOSIS — Z596 Low income: Secondary | ICD-10-CM

## 2021-07-16 NOTE — Chronic Care Management (AMB) (Signed)
°  Care Management   Note  07/16/2021 Name: Walter Min MRN: [id number] DOB: 1951-05-07  Elfreda Zabella Wease is a 70 y.o. year old female who is a primary care patient of Lyndee Hensen, DO and is actively engaged with the care management team. I reached out to Terie Purser by phone today to assist with re-scheduling a follow up visit with the RN Case Manager  Follow up plan: Telephone appointment with care management team member scheduled for:07/22/21  [place] Management  Direct Dial: [phone]

## 2021-07-16 NOTE — Progress Notes (Signed)
[facility])                                            [place] Team    07/16/2021  Veronica Shaw 11-19-1950 [id number]  Care coordination call placed to [employer] in regard to Symbicort application.  Spoke to Veronica Shaw who informs patient was APPROVED 08/01/21-07/31/22 and the updated prescription was received. She advises patient to call in for her first refill in 2023. Patient is aware and phone number for [employer] provided to patient.  Khalifa Knecht P. Guage [place], [place]  [phone]

## 2021-07-19 ENCOUNTER — Ambulatory Visit (INDEPENDENT_AMBULATORY_CARE_PROVIDER_SITE_OTHER): Payer: Medicare Other

## 2021-07-19 DIAGNOSIS — I5022 Chronic systolic (congestive) heart failure: Secondary | ICD-10-CM | POA: Diagnosis not present

## 2021-07-19 DIAGNOSIS — Z95810 Presence of automatic (implantable) cardiac defibrillator: Secondary | ICD-10-CM

## 2021-07-19 IMAGING — DX DG CHEST 2V
2 series · 2 of 2 positions shown · non-contrast
Comparison: August 27, 2018

CLINICAL DATA: Asthma

EXAM:
CHEST - 2 VIEW

[chest pa]
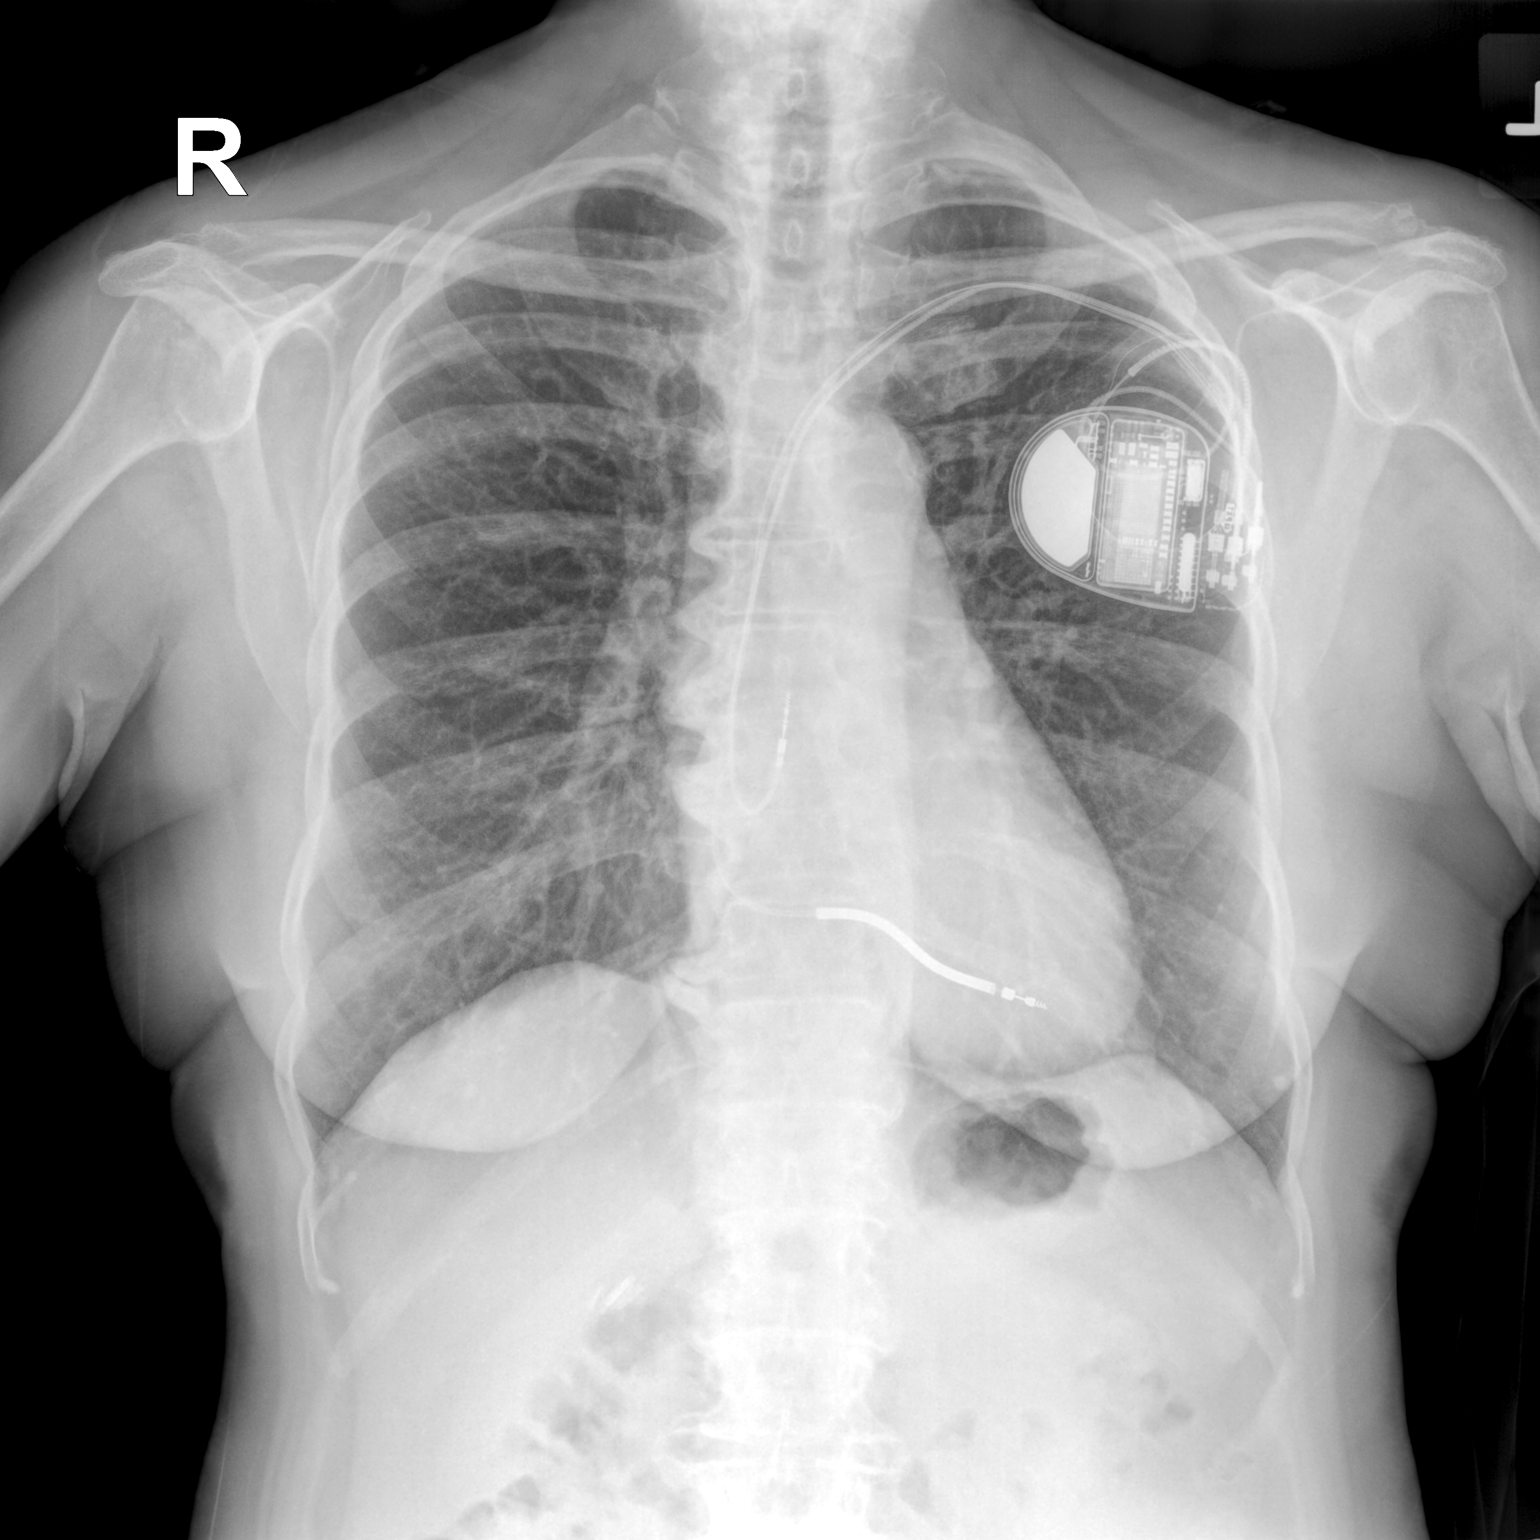

[chest lat]
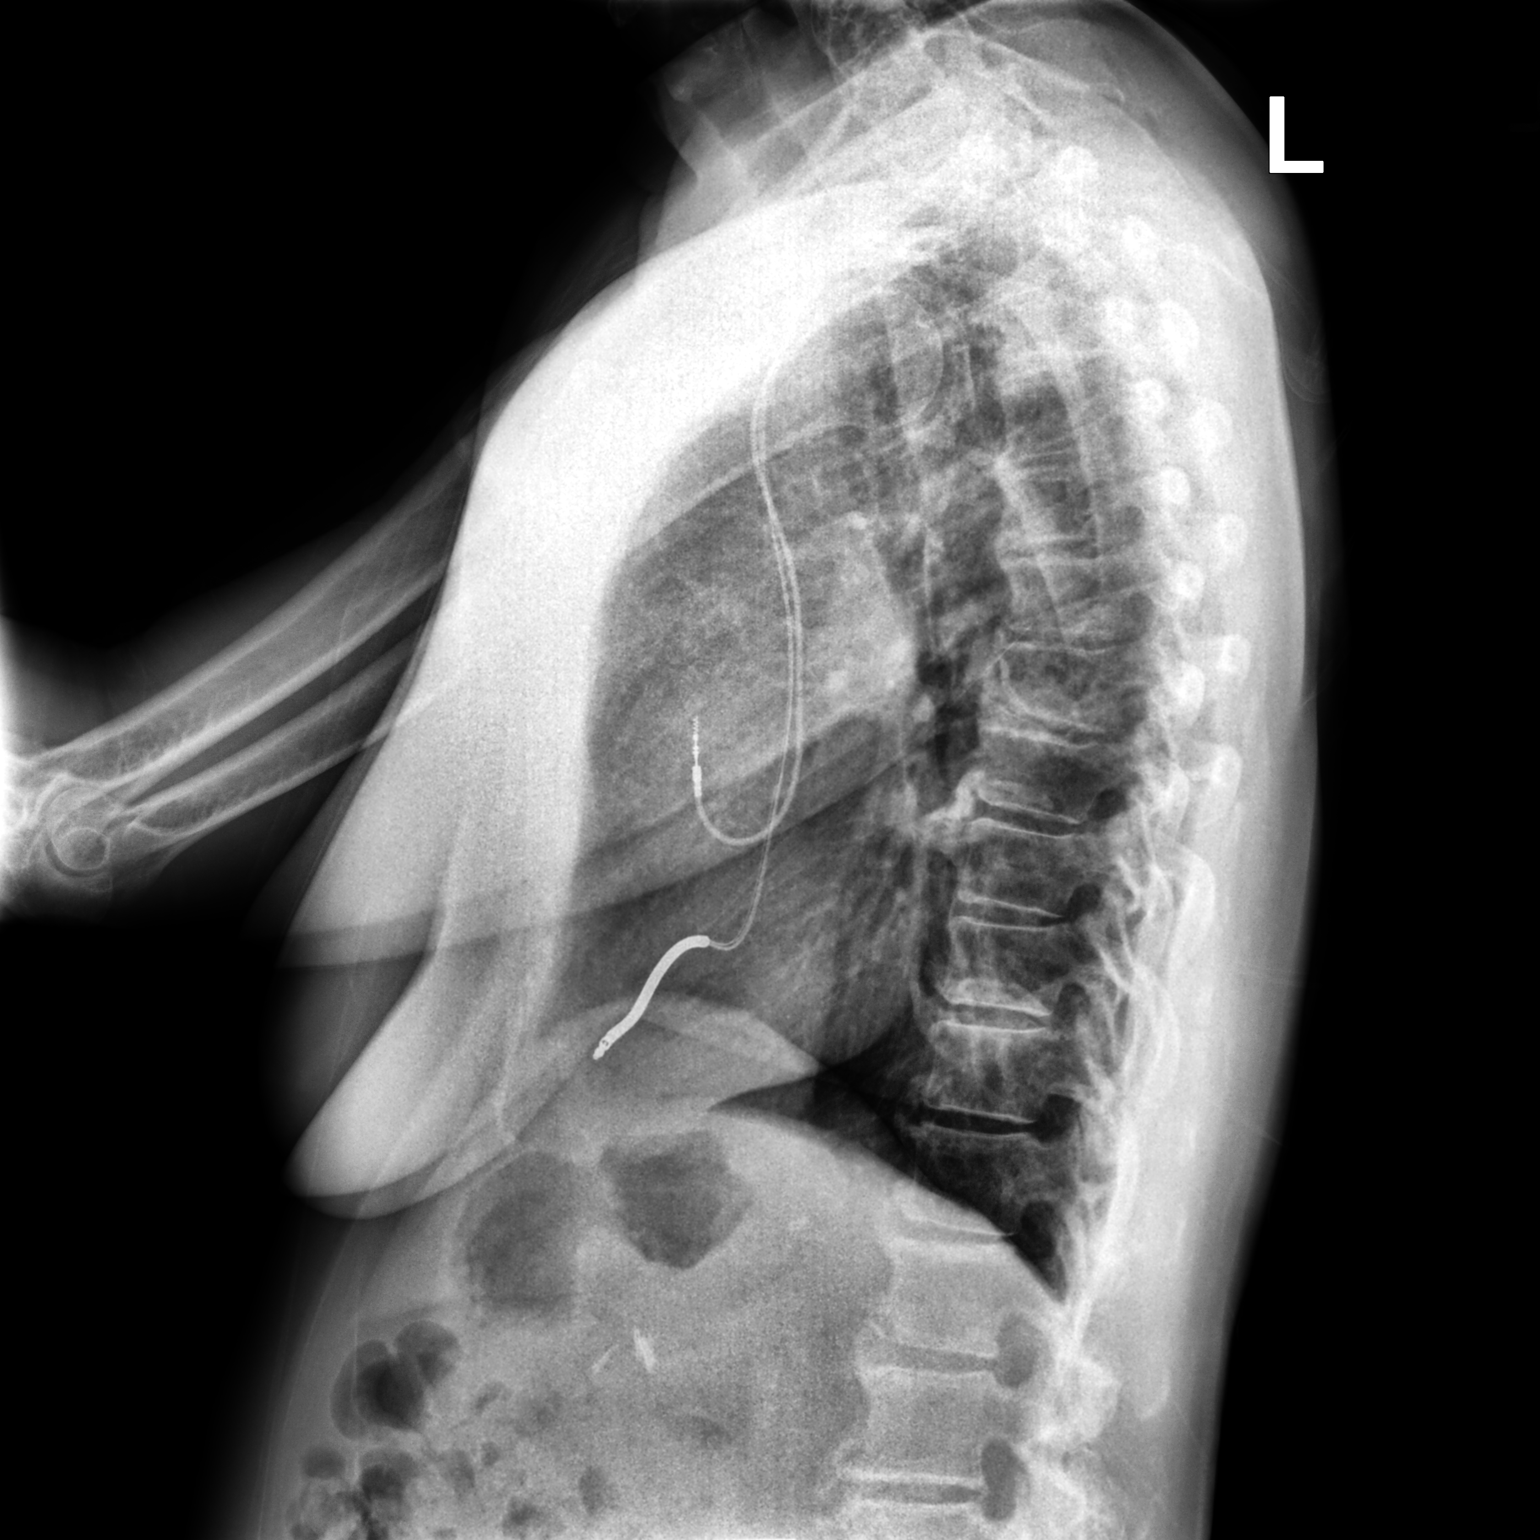

[2 of 2 positions shown; findings below may reference images not displayed]

FINDINGS: There are occasional small calcified granulomas. Lungs otherwise are
clear. Heart size and pulmonary vascularity are normal. Pacemaker
leads are attached to the right atrium and right ventricle. No
adenopathy. There is degenerative change in the thoracic spine.
IMPRESSION: Occasional small calcified granulomas. No edema or airspace opacity.
Heart size normal. Pacemaker leads attached to right atrium and
right ventricle.

## 2021-07-20 ENCOUNTER — Other Ambulatory Visit: Payer: Self-pay | Admitting: Family Medicine

## 2021-07-20 ENCOUNTER — Other Ambulatory Visit: Payer: Self-pay | Admitting: Internal Medicine

## 2021-07-20 NOTE — Progress Notes (Signed)
EPIC Encounter for ICM Monitoring  Patient Name: Veronica Shaw is a 70 y.o. female Date: 07/20/2021 Primary Care Physican: Lyndee Hensen, DO Primary Cardiologist: Cooper/Weaver PA Electrophysiologist: Lovena Le 05/14/2021 Weight: 130 lbs                                       Spoke with patient and heart failure questions reviewed.  Pt asymptomatic for fluid accumulation.   She is having some minor post COVID symptoms such as loss of taste and weakness.   OptiVol Thoracic impedance normal.   Prescribed: Furosemide 40 mg Take 1 tablet (40 mg total) by mouth daily.     Labs: 04/20/2021 Creatinine 1.24, BUN 13, Potassium 4.6, Sodium 140, GFR 46 01/22/2021 Creatinine 1.50, BUN 29, Potassium 4.6, Sodium 144 01/18/2021 Creatinine 1.39, BUN 23, Potassium 4.6, Sodium 137, GFR 41  01/01/2021 Creatinine 1.72, BUN 24, Potassium 4.1, Sodium 133, GFR 31  11/04/2020 Creatinine 1.20, BUN 25, Potassium 5.0, Sodium 138, GFR 48 10/18/2020 Creatinine 1.04, BUN 19, Potassium 4.0, Sodium 136 10/17/2020 Creatinine 1.09, BUN 19, Potassium 4.4, Sodium 137  A complete set of results can be found in Results Review.   Recommendations:  No changes and encouraged to call if experiencing any fluid symptoms.   Follow-up plan: ICM clinic phone appointment on 08/20/2021.   91 day device clinic remote transmission 08/26/2021.     EP/Cardiology Office Visits:  08/20/2021 with Richardson Dopp, PA.   Recalls 01/17/2022 for [name].     Copy of ICM check sent to Dr. Lovena Le.   3 month ICM trend: 07/19/2021.    12-14 Month ICM trend:       [name], RN 07/20/2021 5:16 PM

## 2021-07-22 ENCOUNTER — Ambulatory Visit: Payer: Medicare Other

## 2021-07-22 NOTE — Patient Instructions (Signed)
Visit Information  [name]  it was nice speaking with you. Please call me directly [phone] if you have questions about the goals we discussed.  Patient Goals/Self Care Activities: -Patient/Caregiver will self-administer medications as prescribed as evidenced by self-report/primary caregiver report  -Patient/Caregiver will attend all scheduled provider appointments as evidenced by clinician review of documented attendance to scheduled appointments and patient/caregiver report -Patient/Caregiver will call pharmacy for medication refills as evidenced by patient report and review of pharmacy fill history as appropriate -Patient/Caregiver will call provider office for new concerns or questions as evidenced by review of documented incoming telephone call notes and patient report -Patient/Caregiver verbalizes understanding of plan -Patient/Caregiver will focus on medication adherence by taking medications as prescribed -Weigh daily and record (notify MD with 3 lb weight gain over night or 5 lb in a week) -Follow CHF Action Plan -Adhere to low sodium diet   The patient verbalized understanding of instructions, educational materials, and care plan provided today and declined offer to receive copy of patient instructions, educational materials, and care plan.   Follow up Plan: Patient would like continued follow-up.  CCM RNCM will outreach the patient within the next 4 weeks.  Patient will call office if needed prior to next encounter  Lazaro Arms, RN  [phone]

## 2021-07-22 NOTE — Chronic Care Management (AMB) (Signed)
Chronic Care Management   CCM RN Visit Note  07/22/2021 Name: Veronica Shaw MRN: [id number] DOB: [birth year]  Subjective: Veronica Shaw is a 70 y.o. year old female who is a primary care patient of Lyndee Hensen, DO. The care management team was consulted for assistance with disease management and care coordination needs.    Engaged with patient by telephone for follow up visit in response to provider referral for case management and/or care coordination services.   Consent to Services:  The patient was given information about Chronic Care Management services, agreed to services, and gave verbal consent prior to initiation of services.  Please see initial visit note for detailed documentation.   Patient agreed to services and verbal consent obtained.    Assessment:  The patient is experiencing some dizziness, and still  . See Care Plan below for interventions and patient self-care actives. Follow up Plan: Patient would like continued follow-up.  CCM RNCM will outreach the patient within the next 4 weeks.  Patient will call office if needed prior to next encounter Review of patient past medical history, allergies, medications, health status, including review of consultants reports, laboratory and other test data, was performed as part of comprehensive evaluation and provision of chronic care management services.   SDOH (Social Determinants of Health) assessments and interventions performed:    CCM Care Plan  Allergies  Allergen Reactions   Aspirin Swelling and Other (See Comments)    Other reaction(s): Other (See Comments) Causes nose bleeds *ONLY THE COATED ASA* Causes nose bleeds *ONLY THE COATED ASA*   Penicillins Shortness Of Breath and Rash    Shortness of Breath - Throat felt like it was closing.    Prempro [Conj Estrog-Medroxyprogest Ace] Shortness Of Breath    Throat swelling Throat swelling   Ace Inhibitors Cough   Simvastatin Other (See Comments) and Rash     Muscle aches    Outpatient Encounter Medications as of 07/22/2021  Medication Sig   acetaminophen (TYLENOL) 500 MG tablet Take 2 tablets (1,000 mg total) by mouth every 8 (eight) hours as needed.   APPLE CIDER VINEGAR PO Take 1 capsule by mouth daily.   Biotin 5000 MCG CAPS Take 1 tablet by mouth daily.    budesonide-formoterol (SYMBICORT) 160-4.5 MCG/ACT inhaler Inhale 2 puffs into the lungs 2 (two) times daily.   carbidopa-levodopa (SINEMET IR) 25-100 MG tablet Take 1 tablet by mouth 3 (three) times daily.   diclofenac Sodium (VOLTAREN) 1 % GEL Apply 4 g topically 4 (four) times daily.   famotidine (PEPCID) 20 MG tablet TAKE 1 TABLET TWICE DAILY   fluticasone (FLONASE SENSIMIST) 27.5 MCG/SPRAY nasal spray Place 2 sprays into the nose daily. (Patient not taking: Reported on 06/01/2021)   furosemide (LASIX) 20 MG tablet Take 20 mg by mouth daily.   levalbuterol (XOPENEX) 0.63 MG/3ML nebulizer solution USE 1 VIAL IN NEBULIZER EVERY 4 TO 6 HOURS AS NEEDED FOR WHEEZING AND FOR SHORTNESS OF BREATH   losartan (COZAAR) 25 MG tablet TAKE 1 TABLET EVERY DAY   meclizine (ANTIVERT) 12.5 MG tablet Take 1 tablet (12.5 mg total) by mouth daily as needed for dizziness. Should not be long term.   melatonin 5 MG TABS Take 5 mg by mouth at bedtime.   metFORMIN (GLUCOPHAGE) 500 MG tablet Take 2 tablets (1,000 mg total) by mouth 2 (two) times daily with a meal.   metoprolol succinate (TOPROL-XL) 50 MG 24 hr tablet Take 1 tablet (50 mg total) by mouth 2 (two)  times daily.   montelukast (SINGULAIR) 10 MG tablet Take 1 tablet (10 mg total) by mouth at bedtime.   OXYGEN Inhale into the lungs at bedtime. Inhale 2L into lungs   potassium chloride (KLOR-CON) 10 MEQ tablet Take 10 mEq by mouth daily as needed (low potassium).   rosuvastatin (CRESTOR) 10 MG tablet TAKE 1 TABLET BY MOUTH TWICE A WEEK   spironolactone (ALDACTONE) 25 MG tablet TAKE 1/2 TABLET EVERY DAY (Patient not taking: Reported on 06/01/2021)    tiZANidine (ZANAFLEX) 2 MG tablet TAKE 1 TABLET BY MOUTH EVERY 8 HOURS AS NEEDED FOR MUSCLE SPASM   Ubiquinone (ULTRA COQ10 PO) Take 1 tablet by mouth 3 (three) times a week.   No facility-administered encounter medications on file as of 07/22/2021.    Patient Active Problem List   Diagnosis Date Noted   Weight loss 05/07/2021   UTI (urinary tract infection) 01/24/2021   Acute on chronic systolic CHF (congestive heart failure), NYHA class 4 ([place]) 10/19/2020   Chronic HFrEF (heart failure with reduced ejection fraction) ([place])    Cervical dystonia 10/07/2020   Advanced care planning/counseling discussion 08/21/2020   Dementia without behavioral disturbance ([place]) 08/21/2020   Vision impairment, Right Eye 08/21/2020   Chronic kidney disease, stage 3a ([place]) 08/18/2020   Frequent urination 07/30/2020   Body mass index (BMI) 26.0-26.9, adult 01/29/2020   Chronic rhinitis 12/18/2019   Chronic respiratory failure with hypoxia ([place]) 12/18/2019   Hyperlipidemia associated with type 2 diabetes mellitus ([place]) 03/05/2019   Cervical radiculopathy 10/24/2018   Falls frequently    Asthma 08/03/2017   Osteopenia 07/10/2015   Mild persistent asthma in adult without complication 54/62/7035   NICM (nonischemic cardiomyopathy) ([place]) 04/23/2014   Arthritis or polyarthritis, rheumatoid ([place]) 03/12/2014   ICD (implantable cardioverter-defibrillator), dual, in situ 03/06/2014   Heart Failure with Recovered Ejection Fraction, G2DD 05/31/2013   Parkinson disease ([place]) 05/21/2013   Vertigo 03/03/2013   Gastroesophageal reflux disease without esophagitis 09/27/2012   Trigger point with neck pain 04/12/2012   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   Diabetic peripheral neuropathy ([place]) 12/09/2011   Chronic urticaria 05/27/2011   Sinus tachycardia ([place]) 05/27/2011   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 05/20/2011   Type II diabetes mellitus with complication ([place]) 00/93/8182   Hypertension associated with diabetes ([place])  09/28/2006    Conditions to be addressed/monitored:CHF  Care Plan : RN Case Manager  Updates made by Lazaro Arms, RN since 07/22/2021 12:00 AM     Problem: Symptom Exacerbation (Heart Failure)      Long-Range Goal: Patient to manage and monitor signs and symptoms of Heart Failure   Start Date: 11/03/2020  Expected End Date: 08/31/2021  Priority: High  Note:   Current Barriers:  Knowledge deficit related to basic heart failure pathophysiology and self care management  Case Manager Clinical Goal(S):  patient will verbalize understanding of Heart Failure Action Plan and when to call doctor  Interventions: Goal on Track Basic overview and discussion of  Heart Failure reviewed Provided verbal education on low sodium diet Reviewed Heart Failure Action Plan  Advised patient to weigh each morning after emptying bladder Discussed importance of daily weight and advised patient to weigh and record daily- 07/22/21:  Veronica Shaw denies having chest pain, shortness of breath, or swelling and still has no taste or sense of smell. She is eating and monitoring her diet but still complains of pain in her back and knees. Her pain is rated at a 7/10. She asked if I would call  her pharmacy because she needs refills for her Flonase and Meclizine for her nasal drainage and dizziness. RN CM called the pharmacy and informed the patient that her Flonase could be purchased over the counter, and I sent a message to her PCP because she needed a new prescription for her Meclizine per the pharmacist.  Patient Goals/Self Care Activities: -Patient/Caregiver will self-administer medications as prescribed as evidenced by self-report/primary caregiver report  -Patient/Caregiver will attend all scheduled provider appointments as evidenced by clinician review of documented attendance to scheduled appointments and patient/caregiver report -Patient/Caregiver will call pharmacy for medication refills as evidenced by patient  report and review of pharmacy fill history as appropriate -Patient/Caregiver will call provider office for new concerns or questions as evidenced by review of documented incoming telephone call notes and patient report -Patient/Caregiver verbalizes understanding of plan -Patient/Caregiver will focus on medication adherence by taking medications as prescribed -Weigh daily and record (notify MD with 3 lb weight gain over night or 5 lb in a week) -Follow CHF Action Plan -Adhere to low sodium diet     Lazaro Arms RN, BSN, [place] Phone: [phone] Fax: [phone]

## 2021-07-23 ENCOUNTER — Ambulatory Visit: Payer: Self-pay

## 2021-07-23 NOTE — Chronic Care Management (AMB) (Signed)
° °  RN Case Manager Care Management   Phone Outreach    07/23/2021 Name: Veronica Shaw MRN: [id number] DOB: 1951/05/24  Veronica Shaw is a 70 y.o. year old female who is a primary care patient of Lyndee Hensen, DO .   CM RN reached out to [name] to inform her that she needs an evaluation for dizziness if she needs a refill on her Meclizine. The patient verbalized understanding. CM RN reached out to the staff and has scheduled an appointment with Dr. Larae Grooms on 07/27/21 at 950 am.  Follow Up Plan:  CM RN will follow-up at the next scheduled interval.    Review of patient status, including review of consultants reports, relevant laboratory and other test results, and collaboration with appropriate care team members and the patient's provider was performed as part of comprehensive patient evaluation and provision of care management services.    Lazaro Arms RN, BSN, [facility] Care Management Coordinator [place] Phone: [phone] Fax: [phone]

## 2021-07-27 ENCOUNTER — Encounter: Payer: Self-pay | Admitting: Family Medicine

## 2021-07-27 ENCOUNTER — Ambulatory Visit (INDEPENDENT_AMBULATORY_CARE_PROVIDER_SITE_OTHER): Payer: Medicare Other | Admitting: Family Medicine

## 2021-07-27 ENCOUNTER — Other Ambulatory Visit: Payer: Self-pay

## 2021-07-27 ENCOUNTER — Ambulatory Visit (INDEPENDENT_AMBULATORY_CARE_PROVIDER_SITE_OTHER): Payer: Medicare Other

## 2021-07-27 DIAGNOSIS — Z23 Encounter for immunization: Secondary | ICD-10-CM

## 2021-07-27 DIAGNOSIS — I951 Orthostatic hypotension: Secondary | ICD-10-CM | POA: Diagnosis not present

## 2021-07-27 DIAGNOSIS — R4586 Emotional lability: Secondary | ICD-10-CM

## 2021-07-27 DIAGNOSIS — G903 Multi-system degeneration of the autonomic nervous system: Secondary | ICD-10-CM | POA: Insufficient documentation

## 2021-07-27 NOTE — Assessment & Plan Note (Signed)
-  vitals consistent with orthostatic hypotension, encouraged adequate oral hydration -also concerned for general hypotension that may be contributing to symptoms, instructed to take half tablet of losartan daily instead of full 25 mg  -low concern for other etiology; prior labs a few months ago fine without evidence of thyroid dysfunction, anemia, electrolyte imbalances and hypoglycemia -follow up in 2 weeks

## 2021-07-27 NOTE — Patient Instructions (Addendum)
It was great seeing you today!  Today we discussed your dizziness. I am concerned that this is due to low blood pressures, please take a half tablet of losartan daily instead of the full 25 mg. Make sure to change positions slowly and stay hydrated.   I am not going to get labs as blood work done recently showed a normal thyroid, normal sugar and electrolyte levels and showed that you are not anemic.   Please follow up at your next scheduled appointment in 2 weeks, if anything arises between now and then, please don't hesitate to contact our office.   Thank you for allowing [place] to be a part of your medical care!  Thank you, Dr. Larae Grooms

## 2021-07-27 NOTE — Progress Notes (Signed)
Office Visit Note  Patient: Veronica Shaw             Date of Birth: 1951/04/07           MRN: [id number]             PCP: Lyndee Hensen, DO Referring: Donnal Moat* Visit Date: 08/09/2021 Occupation: @GUAROCC @  Subjective:  Pain in multiple joints.   History of Present Illness: Veronica Shaw is a 70 y.o. female with a history of osteoarthritis and degenerative disc disease.  She states that she developed COVID in September 2022.  She was very sick for several months.  She states she has still not recovered the sensation of taste and smell.  She continues to have pain and discomfort in her bilateral hands, her knee joints and her feet.  She also has ongoing pain and discomfort in her lower back.  She states her diabetes is not well controlled and she would like to be referred to an endocrinologist.  Activities of Daily Living:  Patient reports morning stiffness for all day. Patient Reports nocturnal pain.  Difficulty dressing/grooming: Denies Difficulty climbing stairs: Reports Difficulty getting out of chair: Denies Difficulty using hands for taps, buttons, cutlery, and/or writing: Reports  Review of Systems  Constitutional:  Positive for fatigue.  HENT:  Negative for mouth sores, mouth dryness and nose dryness.   Eyes:  Positive for dryness. Negative for pain and itching.  Respiratory:  Positive for shortness of breath. Negative for difficulty breathing.   Cardiovascular:  Positive for palpitations. Negative for chest pain.  Gastrointestinal:  Negative for blood in stool, constipation and diarrhea.  Endocrine: Negative for increased urination.  Genitourinary:  Positive for difficulty urinating and painful urination.  Musculoskeletal:  Positive for joint pain, joint pain, joint swelling, myalgias, morning stiffness, muscle tenderness and myalgias.  Skin:  Positive for color change. Negative for rash and redness.  Allergic/Immunologic: Positive for  susceptible to infections.  Neurological:  Positive for dizziness, numbness, parasthesias and weakness. Negative for headaches and memory loss.  Hematological:  Positive for bruising/bleeding tendency.  Psychiatric/Behavioral:  Negative for confusion.    PMFS History:  Patient Active Problem List   Diagnosis Date Noted   Orthostatic hypotension 07/27/2021   Mood changes 07/27/2021   Weight loss 05/07/2021   UTI (urinary tract infection) 01/24/2021   Acute on chronic systolic CHF (congestive heart failure), NYHA class 4 ([place]) 10/19/2020   Chronic HFrEF (heart failure with reduced ejection fraction) ([place])    Cervical dystonia 10/07/2020   Advanced care planning/counseling discussion 08/21/2020   Dementia without behavioral disturbance ([place]) 08/21/2020   Vision impairment, Right Eye 08/21/2020   Chronic kidney disease, stage 3a ([place]) 08/18/2020   Frequent urination 07/30/2020   Body mass index (BMI) 26.0-26.9, adult 01/29/2020   Chronic rhinitis 12/18/2019   Chronic respiratory failure with hypoxia ([place]) 12/18/2019   Hyperlipidemia associated with type 2 diabetes mellitus ([place]) 03/05/2019   Cervical radiculopathy 10/24/2018   Falls frequently    Asthma 08/03/2017   Osteopenia 07/10/2015   Mild persistent asthma in adult without complication 05/17/5101   NICM (nonischemic cardiomyopathy) ([place]) 04/23/2014   Arthritis or polyarthritis, rheumatoid ([place]) 03/12/2014   ICD (implantable cardioverter-defibrillator), dual, in situ 03/06/2014   Heart Failure with Recovered Ejection Fraction, G2DD 05/31/2013   Parkinson disease ([employer]) 05/21/2013   Vertigo 03/03/2013   Gastroesophageal reflux disease without esophagitis 09/27/2012   Trigger point with neck pain 04/12/2012   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  Diabetic peripheral neuropathy (HCC) 12/09/2011   Chronic urticaria 05/27/2011   Sinus tachycardia ([place]) 05/27/2011   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 05/20/2011   Type II diabetes mellitus  with complication ([place]) 34/19/3790   Hypertension associated with diabetes ([place]) 09/28/2006    Past Medical History:  Diagnosis Date   Acute on chronic systolic CHF (congestive heart failure), NYHA class 4 ([place]) 10/19/2020   Asthma    Back pain 09/18/2019   Cervical disc disorder with radiculopathy of cervical region 2/40/9735   Chronic systolic CHF (congestive heart failure) ([place])    a. cMRI 4/15: EF 34% and findings - c/w NICM, normal RV size and function (RVEF 61%), Mild MR // b. Echo 2/15:  EF 30-35%, diff HK, ant-sept AK, Gr 2 DD, mild MR, trivial TR  //  c. Echo 5/17: EF 20-25%, severe diffuse HK, marked systolic dyssynchrony, grade 1 diastolic dysfunction, mild MR  //  d. [place] 5/17: Fick CO 2.9, RVSP 19, PASP 15, PW mean 2, low filing pressures and preserved CO    Cognitive impairment 10/19/2017   MOCA was administered with a score of 23/30   Diabetes mellitus    Diabetic peripheral neuropathy ([place]) 12/09/2011   Dyspnea 09/19/2012   Flu 10/17/2017   Gastritis    History of echocardiogram    Echo 6/18: EF 30-35, diffuse HK, grade 1 diastolic dysfunction, trivial MR, mild LAE, mild TR, no pericardial effusion   History of nuclear stress test    Myoview 5/18: EF 49, no ischemia, inferoseptal defect c/w LBBB artifact (intermediate risk due to EF < 50).   HTN (hypertension)    Hyperlipidemia    Hyperlipidemia associated with type 2 diabetes mellitus ([place]) 03/05/2019   Hypertension associated with diabetes ([place]) 09/28/2006   Qualifier: Diagnosis of  By: [name]     Major depression 03/31/2013   Melena 08/21/2020   NICM (nonischemic cardiomyopathy) ([place])    a. Nuclear 5/13: Normal stress nuclear study. LV Ejection Fraction: 58%  //  b. LHC 10/14: Minor luminal irregularity in prox LAD, EF 35%    Parkinson disease (HCC)    Plantar fasciitis    Plantar fasciitis, bilateral 06/15/2012   Rosacea 05/29/2009   Qualifier: Diagnosis of  By: [name] MD, [name] hearing loss  (SNHL), bilateral 01/04/2017   Sleep apnea    was retested and no longer had it and so d/c CPAP   Syncope    Thoracic or lumbosacral neuritis or radiculitis, unspecified 07/03/2013   Tinnitus, bilateral 01/04/2017   Type II diabetes mellitus with complication ([place]) 10/27/9240   Diabetic eye exam done by [facility]: Dr. Prudencio Burly on 11/08/13: no diabetic retinopathy. Repeat in 1 year     Urticaria     Family History  Problem Relation Age of Onset   Coronary artery disease Father        Died age 61   Heart attack Father    Diabetes Father    Coronary artery disease Mother        Died age 2   Heart attack Mother    Diabetes Mother    Parkinson's disease Sister    Heart disease Sister    Hepatitis C Sister    Diabetes Sister    Diabetes Son 88       T1DM   Healthy Daughter    Diabetes Sister    Heart disease Sister    Diabetes Sister    Heart disease Sister    Breast cancer  Neg Hx    Past Surgical History:  Procedure Laterality Date   BREAST EXCISIONAL BIOPSY Left 1970   benign cyst   BREAST SURGERY Left    BUNIONECTOMY     CARDIAC CATHETERIZATION N/A 12/16/2015   Procedure: Right Heart Cath;  Surgeon: Sherren Mocha, MD;  Location: [facility];  Service: Cardiovascular;  Laterality: N/A;   CHOLECYSTECTOMY     eye lid surgery Bilateral 05/09/2019   IMPLANTABLE CARDIOVERTER DEFIBRILLATOR IMPLANT  11-25-13   MDT dual chamber ICD implanted by [name] for primary prevention   IMPLANTABLE CARDIOVERTER DEFIBRILLATOR IMPLANT N/A 11/25/2013   Procedure: IMPLANTABLE CARDIOVERTER DEFIBRILLATOR IMPLANT;  Surgeon: [name], MD;  Location[facility];  Service: Cardiovascular;  Laterality: N/A;   LEFT AND RIGHT HEART CATHETERIZATION WITH CORONARY ANGIOGRAM N/A 05/31/2013   Procedure: LEFT AND RIGHT HEART CATHETERIZATION WITH CORONARY ANGIOGRAM;  Surgeon: [name], MD;  Location: [facility] CATH LAB;  Service: Cardiovascular;  Laterality: N/A;   TONSILLECTOMY      TUBAL LIGATION     Social History   Social History Narrative   Patient lives with her husband [name].   Patient enjoys spending time with her family, cooking, and her 2 dogs.    Patient has one daughter who lives locally, and one son who lives in [place].   Immunization History  Administered Date(s) Administered   Fluad Quad(high Dose 65+) 05/25/2020, 06/11/2021   Influenza Split 06/17/2011, 04/18/2012   Influenza Whole 05/17/2007, 06/14/2010   Influenza,inj,Quad PF,6+ Mos 05/20/2014, 06/04/2015, 03/22/2016, 05/30/2017, 04/12/2018   Influenza,inj,Quad PF,6-35 Mos 05/03/2013   Influenza-Unspecified 04/01/2019   PFIZER Comirnaty(Gray Top)Covid-19 Tri-Sucrose Vaccine 06/29/2020, 11/16/2020   PFIZER(Purple Top)SARS-COV-2 Vaccination 09/05/2019, 10/01/2019   Pfizer Covid-19 Vaccine Bivalent Booster 61yrs & up 07/27/2021   Pneumococcal Conjugate-13 05/20/2014   Pneumococcal Polysaccharide-23 05/17/2007, 06/01/2013, 03/05/2019   Td 03/01/2006   Zoster, Live 08/01/2009     Objective: Vital Signs: BP 98/64 (BP Location: Left Arm, Patient Position: Sitting, Cuff Size: Normal)    Pulse 84    Ht 5' 1.5" (1.562 m)    Wt 131 lb 3.2 oz (59.5 kg)    LMP 08/01/2000 (Approximate)    BMI 24.39 kg/m    Physical Exam Vitals and nursing note reviewed.  Constitutional:      Appearance: She is well-developed.  HENT:     Head: Normocephalic and atraumatic.  Eyes:     Conjunctiva/sclera: Conjunctivae normal.  Cardiovascular:     Rate and Rhythm: Normal rate and regular rhythm.     Heart sounds: Normal heart sounds.  Pulmonary:     Effort: Pulmonary effort is normal.     Breath sounds: Normal breath sounds.  Abdominal:     General: Bowel sounds are normal.     Palpations: Abdomen is soft.  Musculoskeletal:     Cervical back: Normal range of motion.  Lymphadenopathy:     Cervical: No cervical adenopathy.  Skin:    General: Skin is warm and dry.     Capillary Refill: Capillary refill takes  less than 2 seconds.  Neurological:     Mental Status: She is alert and oriented to person, place, and time.  Psychiatric:        Behavior: Behavior normal.     Musculoskeletal Exam: C-spine was not limited to range of motion with limited lateral rotation flexion and extension.  Lumbar spine was difficult to assess in the sitting position.  Shoulder joints, elbow joints in good range of motion.  She had bilateral CMC thickening and subluxation.  PIP and DIP thickening was noted.  No synovitis was noted.  Hip joints were difficult to assess in the sitting position.  She had a brace on her right knee joint without any warmth or swelling.  Left knee joint was in full range of motion.  There was no tenderness over ankles or MTPs.  CDAI Exam: CDAI Score: -- Patient Global: --; Provider Global: -- Swollen: --; Tender: -- Joint Exam 08/09/2021   No joint exam has been documented for this visit   There is currently no information documented on the homunculus. Go to the Rheumatology activity and complete the homunculus joint exam.  Investigation: No additional findings.  Imaging: No results found.  Recent Labs: Lab Results  Component Value Date   WBC 6.1 05/27/2021   HGB 13.0 05/27/2021   PLT 240.0 05/27/2021   NA 140 04/20/2021   K 4.6 04/20/2021   CL 100 04/20/2021   CO2 21 04/20/2021   GLUCOSE 142 (H) 04/20/2021   BUN 16 04/20/2021   CREATININE 1.24 (H) 04/20/2021   BILITOT 0.3 04/20/2021   ALKPHOS 101 04/20/2021   AST 16 04/20/2021   ALT 9 04/20/2021   PROT 6.7 04/20/2021   ALBUMIN 4.4 04/20/2021   CALCIUM 9.9 04/20/2021   GFRAA 57 (L) 03/31/2020   QFTBGOLDPLUS NEGATIVE 01/30/2019    Speciality Comments: Pertinent PMH: CHF, Hyperlipidemia, DM II, Parkinson's  Procedures:  No procedures performed Allergies: Aspirin, Penicillins, Prempro [conj estrog-medroxyprogest ace], Ace inhibitors, and Simvastatin   Assessment / Plan:     Visit Diagnoses: Primary osteoarthritis  of both hands - CCP 36, RF<14, 14-3-3 negative, sed rate 17, uric acid 5.9, ANA-: She has osteoarthritis in her hands.  No synovitis was noted.  Hand muscle strengthening exercises were demonstrated in the office.  Positive anti-CCP test - anti-CCP 36, RF-, 14-3-3 eta-: No synovitis was noted.  Primary osteoarthritis of both knees - s/p euflexxa bilateral knees 01/2021-03/2021.  She states her knee joints are feeling better but she still has discomfort in her right knee joint.  No warmth swelling or effusion was noted.  Lower extremity muscle strengthening exercises were demonstrated in the office.  Primary osteoarthritis of both feet-she has ongoing discomfort in her feet.  DDD (degenerative disc disease), cervical - CT of the C-spine on 02/04/2019.  She has limited range of motion of her cervical spine.  She states she has had cervical spine injections in the past which helped to some extent but caused her blood sugar to go up.  I gave her a handout on cervical spine exercises and asked her to do only which  DDD (degenerative disc disease), lumbar-she has chronic lower back pain.  A handout on back exercises was given.  Osteopenia, unspecified location-calcium rich diet was emphasized.  Other medical problems are listed as follows:  Parkinson disease ([place])  Type II diabetes mellitus with complication (HCC)-patient wants referral to endocrinologist for the management of diabetes.  I will make the referral per her request.  Diabetic peripheral neuropathy ([place])  Essential hypertension  NICM (nonischemic cardiomyopathy) ([place])  ICD (implantable cardioverter-defibrillator), dual, in situ  History of hyperlipidemia  Chronic systolic heart failure ([place])  Stage 3a chronic kidney disease ([place])  Asthma with COPD with exacerbation ([place])  COVID-19 virus infection -she developed COVID-19 virus infection 04/2021.  She states she was sick for several months and she still has not recovered the taste  and smell sensation.  Orders: No orders of  the defined types were placed in this encounter.  No orders of the defined types were placed in this encounter.    Follow-Up Instructions: Return for Osteoarthritis.   Bo Merino, MD  Note - This record has been created using Editor, commissioning.  Chart creation errors have been sought, but may not always  have been located. Such creation errors do not reflect on  the standard of medical care.

## 2021-07-27 NOTE — Assessment & Plan Note (Signed)
-  PHQ-9 score of 9 with 1 for question 9 reviewed and discussed -reassurance provided, reassuringly patient denies plan and is feeling much better -suicide hotline provided and encouraged to continue to utilize family support -follow up 2 weeks for mood check

## 2021-07-27 NOTE — Progress Notes (Signed)
° ° °  SUBJECTIVE:   CHIEF COMPLAINT / HPI:   Presents dizziness that has been chronic over the past few years but states that she feels that it has worsened. Endorsing history of falls, most recent fall yesterday. Denies head injury or trauma but bumped her leg slightly. Takes half a pill of meclizine twice a week, feels that it helps her symptoms. Denies sudden vision changes, headache and appetite changes. Stays hydrated she thinks. Lives with 59 year old husband, able to do all activities of daily living but has family that lives about 5 minutes away. Her grandchildren help her with complicated tasks as well. Denies syncopal episodes. Gets dizzy almost daily, when she does she sits down and it 5-10 minutes. Dizziness usually occurs when she gets up too fast. Sees cardiologist, on lasix and antihypertensives.   Mood changes  Feels depressed with her ongoing, chornic pains from OA. Does not want to harm herself. But granddaughter gave her creams that have significantly helped her so now she feels better. Family is a significant part of her support system. She states that "I would never want to hurt myself."  OBJECTIVE:   BP 115/61    Pulse 90    Ht 5' 1.5" (1.562 m)    Wt 132 lb 9.6 oz (60.1 kg)    LMP 08/01/2000 (Approximate)    SpO2 97%    BMI 24.65 kg/m   General: Patient was well-appearing, in no acute distress. HEENT: non-tender thyroid CV: RRR, no murmurs or gallops auscultated Resp: CTAB, no wheezing or rhonchi noted Ext: radial pulses strong and equal bilaterally, no LE edema noted bilaterally  Psych: mood appropriate, denies SI, plan or intent   ASSESSMENT/PLAN:   Orthostatic hypotension -vitals consistent with orthostatic hypotension, encouraged adequate oral hydration -also concerned for general hypotension that may be contributing to symptoms, instructed to take half tablet of losartan daily instead of full 25 mg  -low concern for other etiology; prior labs a few months ago fine  without evidence of thyroid dysfunction, anemia, electrolyte imbalances and hypoglycemia -follow up in 2 weeks  Mood changes -PHQ-9 score of 9 with 1 for question 9 reviewed and discussed -reassurance provided, reassuringly patient denies plan and is feeling much better -suicide hotline provided and encouraged to continue to utilize family support -follow up 2 weeks for mood check     Veronica Shaw, [place]

## 2021-07-29 ENCOUNTER — Ambulatory Visit: Payer: Medicare Other | Admitting: Physical Therapy

## 2021-07-29 ENCOUNTER — Ambulatory Visit: Payer: Medicare Other

## 2021-07-29 ENCOUNTER — Ambulatory Visit: Payer: Medicare Other | Admitting: Occupational Therapy

## 2021-08-02 ENCOUNTER — Other Ambulatory Visit: Payer: Self-pay | Admitting: Family Medicine

## 2021-08-09 ENCOUNTER — Encounter: Payer: Self-pay | Admitting: Rheumatology

## 2021-08-09 ENCOUNTER — Ambulatory Visit (INDEPENDENT_AMBULATORY_CARE_PROVIDER_SITE_OTHER): Payer: Medicare Other | Admitting: Rheumatology

## 2021-08-09 ENCOUNTER — Other Ambulatory Visit: Payer: Self-pay

## 2021-08-09 VITALS — BP 98/64 | HR 84 | Ht 61.5 in | Wt 131.2 lb

## 2021-08-09 DIAGNOSIS — M5136 Other intervertebral disc degeneration, lumbar region: Secondary | ICD-10-CM

## 2021-08-09 DIAGNOSIS — E1142 Type 2 diabetes mellitus with diabetic polyneuropathy: Secondary | ICD-10-CM

## 2021-08-09 DIAGNOSIS — M19042 Primary osteoarthritis, left hand: Secondary | ICD-10-CM

## 2021-08-09 DIAGNOSIS — I5022 Chronic systolic (congestive) heart failure: Secondary | ICD-10-CM

## 2021-08-09 DIAGNOSIS — I10 Essential (primary) hypertension: Secondary | ICD-10-CM

## 2021-08-09 DIAGNOSIS — G20 Parkinson's disease: Secondary | ICD-10-CM

## 2021-08-09 DIAGNOSIS — M8580 Other specified disorders of bone density and structure, unspecified site: Secondary | ICD-10-CM

## 2021-08-09 DIAGNOSIS — M5030 Other cervical disc degeneration, unspecified cervical region: Secondary | ICD-10-CM | POA: Diagnosis not present

## 2021-08-09 DIAGNOSIS — M51369 Other intervertebral disc degeneration, lumbar region without mention of lumbar back pain or lower extremity pain: Secondary | ICD-10-CM

## 2021-08-09 DIAGNOSIS — I428 Other cardiomyopathies: Secondary | ICD-10-CM

## 2021-08-09 DIAGNOSIS — R768 Other specified abnormal immunological findings in serum: Secondary | ICD-10-CM

## 2021-08-09 DIAGNOSIS — Z8639 Personal history of other endocrine, nutritional and metabolic disease: Secondary | ICD-10-CM

## 2021-08-09 DIAGNOSIS — M19041 Primary osteoarthritis, right hand: Secondary | ICD-10-CM

## 2021-08-09 DIAGNOSIS — M19072 Primary osteoarthritis, left ankle and foot: Secondary | ICD-10-CM

## 2021-08-09 DIAGNOSIS — Z95810 Presence of automatic (implantable) cardiac defibrillator: Secondary | ICD-10-CM

## 2021-08-09 DIAGNOSIS — U071 COVID-19: Secondary | ICD-10-CM

## 2021-08-09 DIAGNOSIS — N1831 Chronic kidney disease, stage 3a: Secondary | ICD-10-CM

## 2021-08-09 DIAGNOSIS — M170 Bilateral primary osteoarthritis of knee: Secondary | ICD-10-CM

## 2021-08-09 DIAGNOSIS — J441 Chronic obstructive pulmonary disease with (acute) exacerbation: Secondary | ICD-10-CM

## 2021-08-09 DIAGNOSIS — M19071 Primary osteoarthritis, right ankle and foot: Secondary | ICD-10-CM

## 2021-08-09 DIAGNOSIS — J45901 Unspecified asthma with (acute) exacerbation: Secondary | ICD-10-CM

## 2021-08-09 DIAGNOSIS — E118 Type 2 diabetes mellitus with unspecified complications: Secondary | ICD-10-CM

## 2021-08-09 DIAGNOSIS — G20A1 Parkinson's disease without dyskinesia, without mention of fluctuations: Secondary | ICD-10-CM

## 2021-08-09 NOTE — Addendum Note (Signed)
Addended by: Earnestine Mealing on: 08/09/2021 10:49 AM   Modules accepted: Orders

## 2021-08-09 NOTE — Patient Instructions (Signed)
Back Exercises The following exercises strengthen the muscles that help to support the trunk (torso) and back. They also help to keep the lower back flexible. Doing these exercises can help to prevent or lessen existing low back pain. If you have back pain or discomfort, try doing these exercises 2-3 times each day or as told by your health care provider. As your pain improves, do them once each day, but increase the number of times that you repeat the steps for each exercise (do more repetitions). To prevent the recurrence of back pain, continue to do these exercises once each day or as told by your health care provider. Do exercises exactly as told by your health care provider and adjust them as directed. It is normal to feel mild stretching, pulling, tightness, or discomfort as you do these exercises, but you should stop right away if you feel sudden pain or your pain gets worse. Exercises Single knee to chest Repeat these steps 3-5 times for each leg: Lie on your back on a firm bed or the floor with your legs extended. Bring one knee to your chest. Your other leg should stay extended and in contact with the floor. Hold your knee in place by grabbing your knee or thigh with both hands and hold. Pull on your knee until you feel a gentle stretch in your lower back or buttocks. Hold the stretch for 10-30 seconds. Slowly release and straighten your leg.  Pelvic tilt Repeat these steps 5-10 times: Lie on your back on a firm bed or the floor with your legs extended. Bend your knees so they are pointing toward the ceiling and your feet are flat on the floor. Tighten your lower abdominal muscles to press your lower back against the floor. This motion will tilt your pelvis so your tailbone points up toward the ceiling instead of pointing to your feet or the floor. With gentle tension and even breathing, hold this position for 5-10 seconds.  Cat-cow Repeat these steps until your lower back becomes  more flexible: Get into a hands-and-knees position on a firm bed or the floor. Keep your hands under your shoulders, and keep your knees under your hips. You may place padding under your knees for comfort. Let your head hang down toward your chest. Contract your abdominal muscles and point your tailbone toward the floor so your lower back becomes rounded like the back of a cat. Hold this position for 5 seconds. Slowly lift your head, let your abdominal muscles relax, and point your tailbone up toward the ceiling so your back forms a sagging arch like the back of a cow. Hold this position for 5 seconds.  Press-ups Repeat these steps 5-10 times: Lie on your abdomen (face-down) on a firm bed or the floor. Place your palms near your head, about shoulder-width apart. Keeping your back as relaxed as possible and keeping your hips on the floor, slowly straighten your arms to raise the top half of your body and lift your shoulders. Do not use your back muscles to raise your upper torso. You may adjust the placement of your hands to make yourself more comfortable. Hold this position for 5 seconds while you keep your back relaxed. Slowly return to lying flat on the floor.  Bridges Repeat these steps 10 times: Lie on your back on a firm bed or the floor. Bend your knees so they are pointing toward the ceiling and your feet are flat on the floor. Your arms should be flat  at your sides, next to your body. Tighten your buttocks muscles and lift your buttocks off the floor until your waist is at almost the same height as your knees. You should feel the muscles working in your buttocks and the back of your thighs. If you do not feel these muscles, slide your feet 1-2 inches (2.5-5 cm) farther away from your buttocks. Hold this position for 3-5 seconds. Slowly lower your hips to the starting position, and allow your buttocks muscles to relax completely. If this exercise is too easy, try doing it with your arms  crossed over your chest. Abdominal crunches Repeat these steps 5-10 times: Lie on your back on a firm bed or the floor with your legs extended. Bend your knees so they are pointing toward the ceiling and your feet are flat on the floor. Cross your arms over your chest. Tip your chin slightly toward your chest without bending your neck. Tighten your abdominal muscles and slowly raise your torso high enough to lift your shoulder blades a tiny bit off the floor. Avoid raising your torso higher than that because it can put too much stress on your lower back and does not help to strengthen your abdominal muscles. Slowly return to your starting position.  Back lifts Repeat these steps 5-10 times: Lie on your abdomen (face-down) with your arms at your sides, and rest your forehead on the floor. Tighten the muscles in your legs and your buttocks. Slowly lift your chest off the floor while you keep your hips pressed to the floor. Keep the back of your head in line with the curve in your back. Your eyes should be looking at the floor. Hold this position for 3-5 seconds. Slowly return to your starting position.  Contact a health care provider if: Your back pain or discomfort gets much worse when you do an exercise. Your worsening back pain or discomfort does not lessen within 2 hours after you exercise. If you have any of these problems, stop doing these exercises right away. Do not do them again unless your health care provider says that you can. Get help right away if: You develop sudden, severe back pain. If this happens, stop doing the exercises right away. Do not do them again unless your health care provider says that you can. This information is not intended to replace advice given to you by your health care provider. Make sure you discuss any questions you have with your health care provider. Cervical Strain and Sprain Rehab Ask your health care provider which exercises are safe for you. Do  exercises exactly as told by your health care provider and adjust them as directed. It is normal to feel mild stretching, pulling, tightness, or discomfort as you do these exercises. Stop right away if you feel sudden pain or your pain gets worse. Do not begin these exercises until told by your health care provider. Stretching and range-of-motion exercises Cervical side bending  Using good posture, sit on a stable chair or stand up. Without moving your shoulders, slowly tilt your left / right ear to your shoulder until you feel a stretch in the opposite side neck muscles. You should be looking straight ahead. Hold for __________ seconds. Repeat with the other side of your neck. Repeat __________ times. Complete this exercise __________ times a day. Cervical rotation  Using good posture, sit on a stable chair or stand up. Slowly turn your head to the side as if you are looking over your left /  right shoulder. Keep your eyes level with the ground. Stop when you feel a stretch along the side and the back of your neck. Hold for __________ seconds. Repeat this by turning to your other side. Repeat __________ times. Complete this exercise __________ times a day. Thoracic extension and pectoral stretch Roll a towel or a small blanket so it is about 4 inches (10 cm) in diameter. Lie down on your back on a firm surface. Put the towel lengthwise, under your spine in the middle of your back. It should not be under your shoulder blades. The towel should line up with your spine from your middle back to your lower back. Put your hands behind your head and let your elbows fall out to your sides. Hold for __________ seconds. Repeat __________ times. Complete this exercise __________ times a day. Strengthening exercises Isometric upper cervical flexion Lie on your back with a thin pillow behind your head and a small rolled-up towel under your neck. Gently tuck your chin toward your chest and nod your head  down to look toward your feet. Do not lift your head off the pillow. Hold for __________ seconds. Release the tension slowly. Relax your neck muscles completely before you repeat this exercise. Repeat __________ times. Complete this exercise __________ times a day. Isometric cervical extension  Stand about 6 inches (15 cm) away from a wall, with your back facing the wall. Place a soft object, about 6-8 inches (15-20 cm) in diameter, between the back of your head and the wall. A soft object could be a small pillow, a ball, or a folded towel. Gently tilt your head back and press into the soft object. Keep your jaw and forehead relaxed. Hold for __________ seconds. Release the tension slowly. Relax your neck muscles completely before you repeat this exercise. Repeat __________ times. Complete this exercise __________ times a day. Posture and body mechanics Body mechanics refers to the movements and positions of your body while you do your daily activities. Posture is part of body mechanics. Good posture and healthy body mechanics can help to relieve stress in your body's tissues and joints. Good posture means that your spine is in its natural S-curve position (your spine is neutral), your shoulders are pulled back slightly, and your head is not tipped forward. The following are general guidelines for applying improved posture and body mechanics to your everyday activities. Sitting  When sitting, keep your spine neutral and keep your feet flat on the floor. Use a footrest, if necessary, and keep your thighs parallel to the floor. Avoid rounding your shoulders, and avoid tilting your head forward. When working at a desk or a computer, keep your desk at a height where your hands are slightly lower than your elbows. Slide your chair under your desk so you are close enough to maintain good posture. When working at a computer, place your monitor at a height where you are looking straight ahead and you do  not have to tilt your head forward or downward to look at the screen. Standing  When standing, keep your spine neutral and keep your feet about hip-width apart. Keep a slight bend in your knees. Your ears, shoulders, and hips should line up. When you do a task in which you stand in one place for a long time, place one foot up on a stable object that is 2-4 inches (5-10 cm) high, such as a footstool. This helps keep your spine neutral. Resting When lying down and resting, avoid  positions that are most painful for you. Try to support your neck in a neutral position. You can use a contour pillow or a small rolled-up towel. Your pillow should support your neck but not push on it. This information is not intended to replace advice given to you by your health care provider. Make sure you discuss any questions you have with your health care provider. Document Revised: 11/07/2018 Document Reviewed: 04/18/2018 Elsevier Patient Education  [place].

## 2021-08-16 ENCOUNTER — Telehealth: Payer: Self-pay

## 2021-08-16 NOTE — Telephone Encounter (Signed)
Patient called checking the status of her referral to the diabetic doctor.  Patient requested a return call.

## 2021-08-16 NOTE — Telephone Encounter (Signed)
Spoke with patient and advised the referral was placed on 08/09/2021. Advised patient the referring office should be reaching out to her to scheduled. Provided patient with number to referring office [phone], so she may call to follow up.

## 2021-08-19 ENCOUNTER — Telehealth: Payer: Self-pay

## 2021-08-19 ENCOUNTER — Ambulatory Visit: Payer: Medicare Other

## 2021-08-19 DIAGNOSIS — I5020 Unspecified systolic (congestive) heart failure: Secondary | ICD-10-CM | POA: Insufficient documentation

## 2021-08-19 NOTE — Telephone Encounter (Signed)
Attempted ICM call and left message to return call with direct phone number.

## 2021-08-19 NOTE — Progress Notes (Addendum)
Cardiology Office Note:    Date:  08/20/2021   ID:  Veronica Shaw, DOB 23-Apr-1951, MRN [id number]  PCP:  Lyndee Hensen, DO  [facility] Providers Cardiologist:  Sherren Mocha, MD Cardiology APP:  Liliane Shi, PA-C  Electrophysiologist:  Cristopher Peru, MD     Referring MD: Lyndee Hensen, DO   Chief Complaint:  F/u for CHF   Patient Profile: HFmrEF (heart failure with mildly reduced ejection fraction)  Non-ischemic cardiomyopathy Cath 2014: no sig CAD CMR 4/15: EF 34 Echocardiogram 5/17: EF 20-25 Echocardiogram 6/18: EF 30-35 Echocardiogram 3/19: EF 60-65, Gr 2 DD Echo 09/2020: EF 40-45 S/p AICD Diabetes mellitus Hyperlipidemia OSA Asthma Parkinson's Disease Syncope 09/2017 Cervical spine DDD   Prior CV studies: Echocardiogram 10/18/2020 EF 40-45, global HK, normal RVSF, RVSP 22   Echo 10/18/17 EF 60-65, normal wall motion, grade 2 diastolic dysfunction   Echo 01/11/17 EF 30-35, diffuse HK, grade 1 diastolic dysfunction, trivial MR, mild LAE, mild TR   Nuclear stress test 12/29/16 Large size, moderate intensity fixed inferoseptal/septal perfusion defect consistent with LBBB artifact. No reversible ischemia. LVEF 49% with incoordinate septal motion. This is an intermediate risk study (due to LVEF <50).   RHC 12/16/15 CO 2.9; mean RA 1, PASP 15, mean PA 10, mean PCWP 2 1. Low intracardiac filling pressures 2. Preserved cardiac output   Echo 12/11/15 EF 20-25%, severe diffuse HK, marked systolic dyssynchrony, grade 1 diastolic dysfunction, mild MR   Cardiac MRI (4/15):   1. Mild LVE, EF 34%, Diff HK, paradoxical septal motion c/w LBBB, small focal area of late gadolinium enhancement at basal anteroseptum - may represent RV volume overload or a focal infiltrative disease such as sarcoidosis, but certainly wouldn't explain the degree of of LV dysfunction. - c/w NICM, normal RV size and function (RVEF 61%), Mild MR   LHC (10/14):   Minor luminal irregularity in  prox LAD, EF 35%   Echo (2/15):   EF 30-35%, diff HK, ant-sept AK, Gr 2 DD, mild MR, trivial TR   Nuclear (5/13):   Normal stress nuclear study. LV Ejection Fraction: 58%    History of Present Illness:   Veronica Shaw is a 71 y.o. female with the above problem list.  She was last seen in 9/22.  She had some URI symptoms at that time and ultimately tested + for COVID-19.  Her last ICM Clinic transmission showed normal thoracic impedance in Dec 2022.  She returns for f/u.  She continues to have symptoms related to COVID-19.  She still has no taste or smell.  She has had sensitivity over her device since it was implanted.  She has noted more sensitivity recently.  She has not had any fevers.  She has not noticed any erythema.  She has a device check scheduled next week to really assess volume and then device check later in the week to assess function.  She did feel some vibration around her device recently.  Otherwise, she has not had palpitations, chest pain.  She has chronic shortness of breath.  She has not had orthopnea or leg edema.  She has been dizzy.  However, she has not had syncope.        Past Medical History:  Diagnosis Date   Acute on chronic systolic CHF (congestive heart failure), NYHA class 4 (HCC) 10/19/2020   Asthma    Back pain 09/18/2019   Cervical disc disorder with radiculopathy of cervical region 5/63/1497   Chronic systolic CHF (congestive heart  failure) ([place])    a. cMRI 4/15: EF 34% and findings - c/w NICM, normal RV size and function (RVEF 61%), Mild MR // b. Echo 2/15:  EF 30-35%, diff HK, ant-sept AK, Gr 2 DD, mild MR, trivial TR  //  c. Echo 5/17: EF 20-25%, severe diffuse HK, marked systolic dyssynchrony, grade 1 diastolic dysfunction, mild MR  //  d. [place] 5/17: [place] CO 2.9, RVSP 19, PASP 15, PW mean 2, low filing pressures and preserved CO    Cognitive impairment 10/19/2017   MOCA was administered with a score of 23/30   Diabetes mellitus    Diabetic peripheral  neuropathy ([place]) 12/09/2011   Dyspnea 09/19/2012   Flu 10/17/2017   Gastritis    History of echocardiogram    Echo 6/18: EF 30-35, diffuse HK, grade 1 diastolic dysfunction, trivial MR, mild LAE, mild TR, no pericardial effusion   History of nuclear stress test    Myoview 5/18: EF 49, no ischemia, inferoseptal defect c/w LBBB artifact (intermediate risk due to EF < 50).   HTN (hypertension)    Hyperlipidemia    Hyperlipidemia associated with type 2 diabetes mellitus ([place]) 03/05/2019   Hypertension associated with diabetes ([place]) 09/28/2006   Qualifier: Diagnosis of  By: [name]     Major depression 03/31/2013   Melena 08/21/2020   NICM (nonischemic cardiomyopathy) ([place])    a. Nuclear 5/13: Normal stress nuclear study. LV Ejection Fraction: 58%  //  b. LHC 10/14: Minor luminal irregularity in prox LAD, EF 35%    Parkinson disease (HCC)    Plantar fasciitis    Plantar fasciitis, bilateral 06/15/2012   Rosacea 05/29/2009   Qualifier: Diagnosis of  By: [name] MD, [name] hearing loss (SNHL), bilateral 01/04/2017   Sleep apnea    was retested and no longer had it and so d/c CPAP   Syncope    Thoracic or lumbosacral neuritis or radiculitis, unspecified 07/03/2013   Tinnitus, bilateral 01/04/2017   Type II diabetes mellitus with complication ([place]) 01/15/736   Diabetic eye exam done by [facility]: Dr. Prudencio Burly on 11/08/13: no diabetic retinopathy. Repeat in 1 year     Urticaria    Current Medications: Current Meds  Medication Sig   acetaminophen (TYLENOL) 500 MG tablet Take 2 tablets (1,000 mg total) by mouth every 8 (eight) hours as needed.   APPLE CIDER VINEGAR PO Take 1 capsule by mouth daily.   Biotin 5000 MCG CAPS Take 1 tablet by mouth daily.    budesonide-formoterol (SYMBICORT) 160-4.5 MCG/ACT inhaler Inhale 2 puffs into the lungs 2 (two) times daily.   carbidopa-levodopa (SINEMET IR) 25-100 MG tablet Take 1 tablet by mouth 3 (three) times daily.    diclofenac Sodium (VOLTAREN) 1 % GEL Apply 4 g topically 4 (four) times daily.   famotidine (PEPCID) 20 MG tablet TAKE 1 TABLET TWICE DAILY   fluticasone (FLONASE SENSIMIST) 27.5 MCG/SPRAY nasal spray Place 2 sprays into the nose daily.   furosemide (LASIX) 20 MG tablet Take 20 mg by mouth daily.   levalbuterol (XOPENEX) 0.63 MG/3ML nebulizer solution USE 1 VIAL IN NEBULIZER EVERY 4 TO 6 HOURS AS NEEDED FOR WHEEZING AND FOR SHORTNESS OF BREATH   meclizine (ANTIVERT) 12.5 MG tablet Take 1 tablet (12.5 mg total) by mouth daily as needed for dizziness. Should not be long term.   melatonin 5 MG TABS Take 5 mg by mouth at bedtime.   metFORMIN (GLUCOPHAGE) 500 MG tablet TAKE 2 TABLETS BY  MOUTH TWICE DAILY WITH A MEAL   metoprolol succinate (TOPROL-XL) 50 MG 24 hr tablet Take 1 tablet (50 mg total) by mouth 2 (two) times daily.   montelukast (SINGULAIR) 10 MG tablet Take 1 tablet (10 mg total) by mouth at bedtime.   OXYGEN Inhale into the lungs at bedtime. Inhale 2L into lungs   potassium chloride (KLOR-CON) 10 MEQ tablet Take 10 mEq by mouth daily as needed (low potassium).   rosuvastatin (CRESTOR) 10 MG tablet TAKE 1 TABLET BY MOUTH TWICE A WEEK   spironolactone (ALDACTONE) 25 MG tablet TAKE 1/2 TABLET EVERY DAY   tiZANidine (ZANAFLEX) 2 MG tablet TAKE 1 TABLET BY MOUTH EVERY 8 HOURS AS NEEDED FOR MUSCLE SPASM   Ubiquinone (ULTRA COQ10 PO) Take 1 tablet by mouth 3 (three) times a week.   [DISCONTINUED] losartan (COZAAR) 25 MG tablet TAKE 1 TABLET EVERY DAY    Allergies:   Aspirin, Penicillins, Prempro [conj estrog-medroxyprogest ace], Ace inhibitors, and Simvastatin   Social History   Tobacco Use   Smoking status: Never   Smokeless tobacco: Never   Tobacco comments:    exposed to smoke during child hood (parents)  Vaping Use   Vaping Use: Never used  Substance Use Topics   Alcohol use: No    Alcohol/week: 0.0 standard drinks   Drug use: No    Family Hx: The patient's family history  includes Coronary artery disease in her father and mother; Diabetes in her father, mother, sister, sister, and sister; Diabetes (age of onset: 80) in her son; Healthy in her daughter; Heart attack in her father and mother; Heart disease in her sister, sister, and sister; Hepatitis C in her sister; Parkinson's disease in her sister. There is no history of Breast cancer.  Review of Systems  Gastrointestinal:  Negative for hematochezia.  Genitourinary:  Positive for dysuria and frequency. Negative for hematuria.    EKGs/Labs/Other Test Reviewed:    EKG:  EKG is not ordered today.  The ekg ordered today demonstrates n/a  Recent Labs: 10/17/2020: B Natriuretic Peptide 20.6; Magnesium 1.8 04/20/2021: ALT 9; BUN 16; Creatinine, Ser 1.24; NT-Pro BNP 262; Potassium 4.6; Sodium 140; TSH 1.480 05/27/2021: Hemoglobin 13.0; Platelets 240.0   Recent Lipid Panel Recent Labs    01/22/21 0951  CHOL 199  TRIG 123  HDL 48  LDLCALC 129*     Risk Assessment/Calculations:         Physical Exam:    VS:  BP (!) 118/58 (BP Location: Left Arm, Patient Position: Sitting, Cuff Size: Normal)    Pulse 88    Ht 5' 1.5" (1.562 m)    Wt 129 lb (58.5 kg)    LMP 08/01/2000 (Approximate)    SpO2 95%    BMI 23.98 kg/m     Wt Readings from Last 3 Encounters:  08/20/21 129 lb (58.5 kg)  08/09/21 131 lb 3.2 oz (59.5 kg)  07/27/21 132 lb 9.6 oz (60.1 kg)    Constitutional:      Appearance: Healthy appearance. Not in distress.  Neck:     Vascular: No JVR. JVD normal.  Pulmonary:     Effort: Pulmonary effort is normal.     Breath sounds: No wheezing. No rales.  Cardiovascular:     Normal rate. Regular rhythm. Normal S1. Normal S2.      Murmurs: There is no murmur.  Edema:    Peripheral edema absent.  Abdominal:     Palpations: Abdomen is soft.  Skin:  General: Skin is warm and dry.  Neurological:     Mental Status: Alert and oriented to person, place and time.     Cranial Nerves: Cranial nerves are  intact.        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  She remains NYHA IIb-III.  Her volume status seems to be stable.  Her device check in December demonstrated normal thoracic impedance.  She has another device check next week.  She notes frequent urination and symptoms of UTI.  She had her losartan reduced recently due to low blood pressure.  She is not drinking as much or eating as much since she had COVID-19.  She may be somewhat dehydrated.  I have asked her to hold her furosemide for 2 days, then resume at 20 mg daily.  Obtain BMET, CBC today.  Continue losartan 12.5 mg daily, metoprolol succinate 50 mg twice daily, spironolactone 12.5 mg daily.  Follow-up with me or Dr. Burt Knack in 6 months.  Chronic kidney disease, stage 3a ([place]) Obtain follow-up BMET today.  ICD (implantable cardioverter-defibrillator), dual, in situ She notes a vibratory sensation around her device recently.  She also has sensitivity over her ICD site.  This is chronic.  She has no evidence of erythema on exam.  She has not had fevers.  Obtain CBC with differential today.  She has follow-up device checks next week as noted.  I asked her to contact our office if the sensitivity gets worse.  In that case, I would have her see Dr. Lovena Le sooner.  Dysuria Obtain urinalysis and urine culture to assess for UTI.      Dispo:  Return in about 5 months (around 01/18/2022) for Routine Follow Up, w/ Dr. Burt Knack, or Richardson Dopp, PA-C.   Medication Adjustments/Labs and Tests Ordered: Current medicines are reviewed at length with the patient today.  Concerns regarding medicines are outlined above.  Tests Ordered: Orders Placed This Encounter  Procedures   Urine Culture   Basic Metabolic Panel (BMET)   CBC w/Diff   Urinalysis, Routine w reflex microscopic   Medication Changes: Meds ordered this encounter  Medications   losartan (COZAAR) 25 MG tablet    Sig:  Take 0.5 tablets (12.5 mg total) by mouth daily.    Dispense:  90 tablet    Refill:  3   Signed, Richardson Dopp, PA-C  08/20/2021 12:52 PM    [facility] [place], [place], [place]  [address] Phone: [phone]; Fax: [phone]

## 2021-08-19 NOTE — Patient Instructions (Signed)
Visit Information  [name]  it was nice speaking with you. Please call me directly [phone] if you have questions about the goals we discussed.  Patient Goals/Self Care Activities: -Patient/Caregiver will self-administer medications as prescribed as evidenced by self-report/primary caregiver report  -Patient/Caregiver will attend all scheduled provider appointments as evidenced by clinician review of documented attendance to scheduled appointments and patient/caregiver report -Patient/Caregiver will call pharmacy for medication refills as evidenced by patient report and review of pharmacy fill history as appropriate -Patient/Caregiver will call provider office for new concerns or questions as evidenced by review of documented incoming telephone call notes and patient report -Patient/Caregiver verbalizes understanding of plan -Patient/Caregiver will focus on medication adherence by taking medications as prescribed -Weigh daily and record (notify MD with 3 lb weight gain over night or 5 lb in a week) -Follow CHF Action Plan -Adhere to low sodium diet     Patient verbalizes understanding of instructions and care plan provided today and agrees to view in [place]. Active MyChart status confirmed with patient.    Follow up Plan: Patient would like continued follow-up.  CCM RNCM will outreach the patient within the next 5 weeks.  Patient will call office if needed prior to next encounter  Lazaro Arms, RN  [phone]

## 2021-08-19 NOTE — Chronic Care Management (AMB) (Signed)
Chronic Care Management   CCM RN Visit Note  08/19/2021 Name: Veronica Shaw MRN: [id number] DOB: September 07, 1950  Subjective: Veronica Shaw is a 71 y.o. year old female who is a primary care patient of Veronica Hensen, DO. The care management team was consulted for assistance with disease management and care coordination needs.    Engaged with patient by telephone for follow up visit in response to provider referral for case management and/or care coordination services.   Consent to Services:  The patient was given information about Chronic Care Management services, agreed to services, and gave verbal consent prior to initiation of services.  Please see initial visit note for detailed documentation.   Patient agreed to services and verbal consent obtained.    Summary:  The patient continues to maintain positive progress with care plan goals.  She has also improved with her dizziness.See Care Plan below for interventions and patient self-care actives.  Recommendation: The patient benefit from continuing to follow her regimen and taking her medications as prescribed.  Follow up Plan: Patient would like continued follow-up.  CCM RNCM will outreach the patient within the next 5 weeks.  Patient will call office if needed prior to next encounter   Assessment: Review of patient past medical history, allergies, medications, health status, including review of consultants reports, laboratory and other test data, was performed as part of comprehensive evaluation and provision of chronic care management services.   SDOH (Social Determinants of Health) assessments and interventions performed:  No  CCM Care Plan   Conditions to be addressed/monitored:CHF  Care Plan : RN Case Manager  Updates made by Lazaro Arms, RN since 08/19/2021 12:00 AM     Problem: Symptom Exacerbation (Heart Failure)      Long-Range Goal: Patient to manage and monitor signs and symptoms of Heart Failure   Start  Date: 11/03/2020  Expected End Date: 08/31/2021  Priority: High  Note:   Current Barriers:  Knowledge deficit related to basic heart failure pathophysiology and self care management  Case Manager Clinical Goal(S):  patient will verbalize understanding of Heart Failure Action Plan and when to call doctor  Interventions: Goal on Track Basic overview and discussion of  Heart Failure reviewed Provided verbal education on low sodium diet Reviewed Heart Failure Action Plan  Advised patient to weigh each morning after emptying bladder Discussed importance of daily weight and advised patient to weigh and record daily- 08/19/21:  [name] denies having chest pain, shortness of breath, or swelling and still has no taste or sense of smell. She is feeling better than before, up and moving around. On the 07/27/21 visit regarding her dizziness, the physician thought it might be due to her Losartan. The physician lowered the dose to half a tablet, so she now takes Losartan 12.5 mg daily. She feels that it has helped with the dizziness. She has an appointment with cardiology tomorrow at 1030 am, and she plans to discuss the medication change and dizziness with them.  Patient Goals/Self Care Activities: -Patient/Caregiver will self-administer medications as prescribed as evidenced by self-report/primary caregiver report  -Patient/Caregiver will attend all scheduled provider appointments as evidenced by clinician review of documented attendance to scheduled appointments and patient/caregiver report -Patient/Caregiver will call pharmacy for medication refills as evidenced by patient report and review of pharmacy fill history as appropriate -Patient/Caregiver will call provider office for new concerns or questions as evidenced by review of documented incoming telephone call notes and patient report -Patient/Caregiver verbalizes understanding of plan -  Patient/Caregiver will focus on medication adherence by taking  medications as prescribed -Weigh daily and record (notify MD with 3 lb weight gain over night or 5 lb in a week) -Follow CHF Action Plan -Adhere to low sodium diet     Lazaro Arms RN, BSN, [facility]  Phone: [phone]

## 2021-08-19 NOTE — Telephone Encounter (Signed)
Attempted ICM call to request remote transmission and no answer.

## 2021-08-20 ENCOUNTER — Other Ambulatory Visit: Payer: Self-pay

## 2021-08-20 ENCOUNTER — Ambulatory Visit (INDEPENDENT_AMBULATORY_CARE_PROVIDER_SITE_OTHER): Payer: Medicare Other | Admitting: Physician Assistant

## 2021-08-20 ENCOUNTER — Encounter: Payer: Self-pay | Admitting: Physician Assistant

## 2021-08-20 VITALS — BP 118/58 | HR 88 | Ht 61.5 in | Wt 129.0 lb

## 2021-08-20 DIAGNOSIS — I5020 Unspecified systolic (congestive) heart failure: Secondary | ICD-10-CM | POA: Diagnosis not present

## 2021-08-20 DIAGNOSIS — Z95810 Presence of automatic (implantable) cardiac defibrillator: Secondary | ICD-10-CM

## 2021-08-20 DIAGNOSIS — N1831 Chronic kidney disease, stage 3a: Secondary | ICD-10-CM

## 2021-08-20 DIAGNOSIS — R300 Dysuria: Secondary | ICD-10-CM | POA: Diagnosis not present

## 2021-08-20 LAB — BASIC METABOLIC PANEL
BUN/Creatinine Ratio: 19 (ref 12–28)
BUN: 21 mg/dL (ref 8–27)
CO2: 26 mmol/L (ref 20–29)
Calcium: 10.3 mg/dL (ref 8.7–10.3)
Chloride: 103 mmol/L (ref 96–106)
Creatinine, Ser: 1.11 mg/dL — ABNORMAL HIGH (ref 0.57–1.00)
Glucose: 125 mg/dL — ABNORMAL HIGH (ref 70–99)
Potassium: 4.3 mmol/L (ref 3.5–5.2)
Sodium: 140 mmol/L (ref 134–144)
eGFR: 53 mL/min/{1.73_m2} — ABNORMAL LOW (ref >59)

## 2021-08-20 LAB — CBC WITH DIFFERENTIAL/PLATELET
Basophils Absolute: 0 10*3/uL (ref 0.0–0.2)
Basos: 1 %
EOS (ABSOLUTE): 0.3 10*3/uL (ref 0.0–0.4)
Eos: 5 %
Hematocrit: 38.5 % (ref 34.0–46.6)
Hemoglobin: 12.9 g/dL (ref 11.1–15.9)
Lymphocytes Absolute: 1.7 10*3/uL (ref 0.7–3.1)
Lymphs: 23 %
MCH: 29.8 pg (ref 26.6–33.0)
MCHC: 33.5 g/dL (ref 31.5–35.7)
MCV: 89 fL (ref 79–97)
Monocytes Absolute: 0.8 10*3/uL (ref 0.1–0.9)
Monocytes: 10 %
Neutrophils Absolute: 4.5 10*3/uL (ref 1.4–7.0)
Neutrophils: 61 %
Platelets: 213 10*3/uL (ref 150–450)
RBC: 4.33 x10E6/uL (ref 3.77–5.28)
RDW: 13 % (ref 11.7–15.4)
WBC: 7.3 10*3/uL (ref 3.4–10.8)

## 2021-08-20 MED ORDER — LOSARTAN POTASSIUM 25 MG PO TABS: 12.5000 mg | ORAL_TABLET | Freq: Every day | ORAL | 3 refills | Status: DC

## 2021-08-20 NOTE — Assessment & Plan Note (Signed)
EF 40-45 by echocardiogram March 2022.  She remains NYHA IIb-III.  Her volume status seems to be stable.  Her device check in December demonstrated normal thoracic impedance.  She has another device check next week.  She notes frequent urination and symptoms of UTI.  She had her losartan reduced recently due to low blood pressure.  She is not drinking as much or eating as much since she had COVID-19.  She may be somewhat dehydrated.  I have asked her to hold her furosemide for 2 days, then resume at 20 mg daily.  Obtain BMET, CBC today.  Continue losartan 12.5 mg daily, metoprolol succinate 50 mg twice daily, spironolactone 12.5 mg daily.  Follow-up with me or Dr. Burt Knack in 6 months.

## 2021-08-20 NOTE — Patient Instructions (Addendum)
Medication Instructions:   HOLD Lasix X 2 days than resume.    *If you need a refill on your cardiac medications before your next appointment, please call your pharmacy*   Lab Work:  TODAY!!!!  BMET/CBC/W/DIFF  If you have labs (blood work) drawn today and your tests are completely normal, you will receive your results only by: [place] (if you have MyChart) OR A paper copy in the mail If you have any lab test that is abnormal or we need to change your treatment, we will call you to review the results.   Testing/Procedures:  None ordered   Follow-Up: At [facility], you and your health needs are our priority.  As part of our continuing mission to provide you with exceptional heart care, we have created designated Provider Care Teams.  These Care Teams include your primary Cardiologist (physician) and Advanced Practice Providers (APPs -  Physician Assistants and Nurse Practitioners) who all work together to provide you with the care you need, when you need it.  We recommend signing up for the patient portal called "MyChart".  Sign up information is provided on this After Visit Summary.  MyChart is used to connect with patients for Virtual Visits (Telemedicine).  Patients are able to view lab/test results, encounter notes, upcoming appointments, etc.  Non-urgent messages can be sent to your provider as well.   To learn more about what you can do with MyChart, go to [email or web address].    Your next appointment:   5 month(s)  The format for your next appointment:   In Person  Provider:   Sherren Mocha, MD     Other Instructions   Please bring Urine sample back in office to lab.

## 2021-08-20 NOTE — Assessment & Plan Note (Signed)
Obtain follow-up BMET today. 

## 2021-08-20 NOTE — Assessment & Plan Note (Signed)
Obtain urinalysis and urine culture to assess for UTI.

## 2021-08-20 NOTE — Assessment & Plan Note (Addendum)
She notes a vibratory sensation around her device recently.  She also has sensitivity over her ICD site.  This is chronic.  She has no evidence of erythema on exam.  She has not had fevers.  Obtain CBC with differential today.  She has follow-up device checks next week as noted.  I asked her to contact our office if the sensitivity gets worse.  In that case, I would have her see Dr. Lovena Le sooner.

## 2021-08-23 ENCOUNTER — Other Ambulatory Visit: Payer: Self-pay | Admitting: *Deleted

## 2021-08-23 ENCOUNTER — Ambulatory Visit (INDEPENDENT_AMBULATORY_CARE_PROVIDER_SITE_OTHER): Payer: Medicare Other

## 2021-08-23 DIAGNOSIS — I5022 Chronic systolic (congestive) heart failure: Secondary | ICD-10-CM | POA: Diagnosis not present

## 2021-08-23 DIAGNOSIS — Z95810 Presence of automatic (implantable) cardiac defibrillator: Secondary | ICD-10-CM

## 2021-08-23 MED ORDER — SULFAMETHOXAZOLE-TRIMETHOPRIM 800-160 MG PO TABS: 1.0000 | ORAL_TABLET | Freq: Two times a day (BID) | ORAL | 0 refills | Status: AC

## 2021-08-24 DIAGNOSIS — N1831 Chronic kidney disease, stage 3a: Secondary | ICD-10-CM | POA: Diagnosis not present

## 2021-08-24 LAB — URINE CULTURE

## 2021-08-24 LAB — URINALYSIS, ROUTINE W REFLEX MICROSCOPIC
Bilirubin, UA: NEGATIVE
Glucose, UA: NEGATIVE
Nitrite, UA: POSITIVE — AB
RBC, UA: NEGATIVE
Specific Gravity, UA: 1.015 (ref 1.005–1.030)
Urobilinogen, Ur: 0.2 mg/dL (ref 0.2–1.0)
pH, UA: 5.5 (ref 5.0–7.5)

## 2021-08-24 LAB — MICROSCOPIC EXAMINATION
Casts: NONE SEEN /lpf
RBC, Urine: NONE SEEN /hpf (ref 0–2)
WBC, UA: >30 /hpf — AB (ref 0–5)

## 2021-08-24 MED ORDER — NITROFURANTOIN MONOHYD MACRO 100 MG PO CAPS: 100.0000 mg | ORAL_CAPSULE | Freq: Two times a day (BID) | ORAL | 0 refills | Status: DC

## 2021-08-24 NOTE — Addendum Note (Signed)
Addended byKathlen Mody, Nicki Reaper T on: 08/24/2021 12:33 PM   Modules accepted: Orders

## 2021-08-25 ENCOUNTER — Telehealth: Payer: Self-pay

## 2021-08-25 NOTE — Telephone Encounter (Signed)
Remote ICM transmission received.  Attempted call to patient regarding ICM remote transmission and left detailed message per DPR.  Advised to return call for any fluid symptoms or questions. Next ICM remote transmission scheduled 09/27/2021.

## 2021-08-25 NOTE — Progress Notes (Signed)
EPIC Encounter for ICM Monitoring  Patient Name: Veronica Shaw is a 71 y.o. female Date: 08/25/2021 Primary Care Physican: Lyndee Hensen, DO Primary Cardiologist: Cooper/Weaver PA Electrophysiologist: Lovena Le 08/20/2021 Office Weight: 129 lbs                                       Attempted call to patient and unable to reach.  Left detailed message per DPR regarding transmission. Transmission reviewed.    OptiVol Thoracic impedance normal.   Prescribed: Furosemide 20 mg Take 1 tablet (20 mg total) by mouth daily.     Labs: 08/20/2021 Creatinine 1.11, BUN 21, Potassium 4.3, Sodium 140, GFR 53 04/20/2021 Creatinine 1.24, BUN 13, Potassium 4.6, Sodium 140, GFR 46 01/22/2021 Creatinine 1.50, BUN 29, Potassium 4.6, Sodium 144 01/18/2021 Creatinine 1.39, BUN 23, Potassium 4.6, Sodium 137, GFR 41  A complete set of results can be found in Results Review.   Recommendations:  Left voice mail with ICM number and encouraged to call if experiencing any fluid symptoms.   Follow-up plan: ICM clinic phone appointment on 09/27/2021.   91 day device clinic remote transmission 11/25/2021.     EP/Cardiology Office Visits:   Recalls 01/17/2022 for [name].     Copy of ICM check sent to Dr. Lovena Le.   3 month ICM trend: 08/23/2021.    12-14 Month ICM trend:     [name], RN 08/25/2021 3:00 PM

## 2021-08-26 ENCOUNTER — Ambulatory Visit (INDEPENDENT_AMBULATORY_CARE_PROVIDER_SITE_OTHER): Payer: Medicare Other

## 2021-08-26 DIAGNOSIS — I428 Other cardiomyopathies: Secondary | ICD-10-CM | POA: Diagnosis not present

## 2021-08-26 NOTE — Telephone Encounter (Signed)
Ask pt if she took with food.  If not, is she willing to try it again and take with a meal.  I would not take the capsule until she is done eating (on a full stomach).  She has penicillin listed as an allergy.  Her culture shows that the organism is sensitive to Augmentin.  Ask her if she has ever been able to tolerate Augmentin or Amoxicilllin.  Richardson Dopp, PA-C    08/26/2021 5:11 PM

## 2021-08-27 LAB — CUP PACEART REMOTE DEVICE CHECK
Battery Remaining Longevity: 26 mo
Battery Voltage: 2.92 V
Brady Statistic AP VP Percent: 0 %
Brady Statistic AP VS Percent: 0.01 %
Brady Statistic AS VP Percent: 0.03 %
Brady Statistic AS VS Percent: 99.96 %
Brady Statistic RA Percent Paced: 0.01 %
Brady Statistic RV Percent Paced: 0.03 %
Date Time Interrogation Session: 20230127122816
HighPow Impedance: 70 Ohm
Implantable Lead Implant Date: 20150427
Implantable Lead Implant Date: 20150427
Implantable Lead Location: 753859
Implantable Lead Location: 753860
Implantable Lead Model: 5076
Implantable Lead Model: 6935
Implantable Pulse Generator Implant Date: 20150427
Lead Channel Impedance Value: 361 Ohm
Lead Channel Impedance Value: 4047 Ohm
Lead Channel Impedance Value: 4047 Ohm
Lead Channel Impedance Value: 4047 Ohm
Lead Channel Impedance Value: 551 Ohm
Lead Channel Impedance Value: 608 Ohm
Lead Channel Pacing Threshold Amplitude: 0.625 V
Lead Channel Pacing Threshold Amplitude: 0.625 V
Lead Channel Pacing Threshold Pulse Width: 0.4 ms
Lead Channel Pacing Threshold Pulse Width: 0.4 ms
Lead Channel Sensing Intrinsic Amplitude: 10.875 mV
Lead Channel Sensing Intrinsic Amplitude: 10.875 mV
Lead Channel Sensing Intrinsic Amplitude: 2.125 mV
Lead Channel Sensing Intrinsic Amplitude: 2.125 mV
Lead Channel Setting Pacing Amplitude: 2 V
Lead Channel Setting Pacing Amplitude: 2.5 V
Lead Channel Setting Pacing Pulse Width: 0.4 ms
Lead Channel Setting Sensing Sensitivity: 0.3 mV

## 2021-09-01 ENCOUNTER — Other Ambulatory Visit: Payer: Self-pay

## 2021-09-01 ENCOUNTER — Encounter: Payer: Self-pay | Admitting: Pulmonary Disease

## 2021-09-01 ENCOUNTER — Ambulatory Visit (INDEPENDENT_AMBULATORY_CARE_PROVIDER_SITE_OTHER): Payer: Medicare Other | Admitting: Pulmonary Disease

## 2021-09-01 VITALS — BP 116/62 | HR 91 | Temp 98.0°F | Ht 61.0 in | Wt 127.0 lb

## 2021-09-01 DIAGNOSIS — J4540 Moderate persistent asthma, uncomplicated: Secondary | ICD-10-CM | POA: Diagnosis not present

## 2021-09-01 DIAGNOSIS — J4530 Mild persistent asthma, uncomplicated: Secondary | ICD-10-CM

## 2021-09-01 LAB — PULMONARY FUNCTION TEST
DL/VA % pred: 100 %
DL/VA: 4.23 ml/min/mmHg/L
DLCO cor % pred: 102 %
DLCO cor: 17.8 ml/min/mmHg
DLCO unc % pred: 100 %
DLCO unc: 17.52 ml/min/mmHg
FEF 25-75 Post: 1.62 L/sec
FEF 25-75 Pre: 1.13 L/sec
FEF2575-%Change-Post: 43 %
FEF2575-%Pred-Post: 99 %
FEF2575-%Pred-Pre: 68 %
FEV1-%Change-Post: 10 %
FEV1-%Pred-Post: 90 %
FEV1-%Pred-Pre: 82 %
FEV1-Post: 1.75 L
FEV1-Pre: 1.59 L
FEV1FVC-%Change-Post: 0 %
FEV1FVC-%Pred-Pre: 97 %
FEV6-%Change-Post: 10 %
FEV6-%Pred-Post: 96 %
FEV6-%Pred-Pre: 87 %
FEV6-Post: 2.37 L
FEV6-Pre: 2.14 L
FEV6FVC-%Change-Post: 0 %
FEV6FVC-%Pred-Post: 104 %
FEV6FVC-%Pred-Pre: 104 %
FVC-%Change-Post: 10 %
FVC-%Pred-Post: 92 %
FVC-%Pred-Pre: 83 %
FVC-Post: 2.37 L
FVC-Pre: 2.15 L
Post FEV1/FVC ratio: 74 %
Post FEV6/FVC ratio: 100 %
Pre FEV1/FVC ratio: 74 %
Pre FEV6/FVC Ratio: 99 %
RV % pred: 124 %
RV: 2.59 L
TLC % pred: 104 %
TLC: 4.79 L

## 2021-09-01 NOTE — Progress Notes (Signed)
PFT done today. 

## 2021-09-01 NOTE — Patient Instructions (Signed)
I am glad you are doing well with your breathing Your lung function test shows mild changes of asthma otherwise the lung function is normal Continue supplemental oxygen at night, inhalers Follow-up in 6 months

## 2021-09-01 NOTE — Progress Notes (Signed)
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  She was ill for 2 weeks but has gradually recovered.  Chest x-ray at that time did not show any acute lung abnormalities.  She recently started using CBD which has improved her symptoms  Pets: 2 dogs Occupation: Retired Quarry manager Exposures: No mold, hot tub, Customer service manager.  No feather pillows or comforters Smoking history: Non-smoker Travel history: Originally from [place].  No significant recent travel Relevant family history: History of emphysema and asthma in her parents  Interval history: Continues on Symbicort.  States that breathing is doing well Here for review of PFTs  Outpatient Encounter Medications as of 09/01/2021  Medication Sig   acetaminophen (TYLENOL) 500 MG tablet Take 2 tablets (1,000 mg total) by mouth every 8 (eight) hours as needed.   APPLE CIDER VINEGAR PO Take 1 capsule by mouth daily.   Biotin 5000 MCG CAPS Take 1 tablet by mouth daily.    budesonide-formoterol (SYMBICORT) 160-4.5 MCG/ACT inhaler Inhale 2 puffs into the lungs 2 (two) times daily.   carbidopa-levodopa (SINEMET IR) 25-100 MG tablet Take 1 tablet by mouth 3 (three) times daily.   diclofenac Sodium (VOLTAREN) 1 % GEL Apply 4 g topically 4 (four) times daily.   famotidine (PEPCID) 20 MG tablet TAKE 1 TABLET TWICE DAILY   fluticasone (FLONASE SENSIMIST) 27.5 MCG/SPRAY nasal  spray Place 2 sprays into the nose daily.   furosemide (LASIX) 20 MG tablet Take 20 mg by mouth daily.   levalbuterol (XOPENEX) 0.63 MG/3ML nebulizer solution USE 1 VIAL IN NEBULIZER EVERY 4 TO 6 HOURS AS NEEDED FOR WHEEZING AND FOR SHORTNESS OF BREATH   losartan (COZAAR) 25 MG tablet Take 0.5 tablets (12.5 mg total) by mouth daily.   meclizine (ANTIVERT) 12.5 MG tablet Take 1 tablet (12.5 mg total) by mouth daily as needed for dizziness. Should not be long term.   melatonin 5 MG TABS Take 5 mg by mouth at bedtime.   metFORMIN (GLUCOPHAGE) 500 MG tablet TAKE 2 TABLETS BY MOUTH TWICE DAILY WITH A MEAL   metoprolol succinate (TOPROL-XL) 50 MG 24 hr tablet Take 1 tablet (50 mg total) by mouth 2 (two) times daily.   montelukast (SINGULAIR) 10 MG tablet Take 1 tablet (10 mg total) by mouth at bedtime.   OXYGEN Inhale into the lungs at bedtime. Inhale 2L into lungs   potassium chloride (KLOR-CON) 10 MEQ tablet Take 10 mEq by mouth daily as needed (low potassium).   rosuvastatin (CRESTOR) 10 MG tablet TAKE 1 TABLET BY MOUTH TWICE A WEEK   spironolactone (ALDACTONE) 25 MG tablet TAKE 1/2 TABLET EVERY DAY   tiZANidine (ZANAFLEX) 2 MG tablet TAKE 1 TABLET BY MOUTH EVERY 8 HOURS AS NEEDED FOR MUSCLE SPASM   Ubiquinone (ULTRA COQ10 PO) Take 1 tablet by mouth 3 (three) times a week.   No facility-administered encounter medications on file as of 09/01/2021.    Allergies as of 09/01/2021 - Review  Complete 09/01/2021  Allergen Reaction Noted   Aspirin Swelling and Other (See Comments) 04/20/2013   Penicillins Shortness Of Breath and Rash 05/17/2007   Prempro [conj estrog-medroxyprogest ace] Shortness Of Breath 02/13/2012   Ace inhibitors Cough 06/18/2007   Simvastatin Other (See Comments) and Rash 12/09/2011    Physical Exam: Blood pressure 116/62, pulse 91, temperature 98 F (36.7 C), temperature source Oral, height 5\' 1"  (1.549 m), weight 127 lb (57.6 kg), last menstrual period 08/01/2000, SpO2 99  %. Gen:      No acute distress HEENT:  EOMI, sclera anicteric Neck:     No masses; no thyromegaly Lungs:    Clear to auscultation bilaterally; normal respiratory effort CV:         Regular rate and rhythm; no murmurs Abd:      + bowel sounds; soft, non-tender; no palpable masses, no distension Ext:    No edema; adequate peripheral perfusion Skin:      Warm and dry; no rash Neuro: alert and oriented x 3 Psych: normal mood and affect   Data Reviewed: Imaging: CTA 10/17/2020-no pulmonary embolism, mild scarring/atelectasis in the lower lobes CXR 04/28/21-no acute cardiopulmonary abnormality I have reviewed the images personally.  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

## 2021-09-03 DIAGNOSIS — G20 Parkinson's disease: Secondary | ICD-10-CM | POA: Diagnosis not present

## 2021-09-03 DIAGNOSIS — D631 Anemia in chronic kidney disease: Secondary | ICD-10-CM | POA: Diagnosis not present

## 2021-09-03 DIAGNOSIS — E1140 Type 2 diabetes mellitus with diabetic neuropathy, unspecified: Secondary | ICD-10-CM | POA: Diagnosis not present

## 2021-09-03 DIAGNOSIS — N1831 Chronic kidney disease, stage 3a: Secondary | ICD-10-CM | POA: Diagnosis not present

## 2021-09-03 DIAGNOSIS — I129 Hypertensive chronic kidney disease with stage 1 through stage 4 chronic kidney disease, or unspecified chronic kidney disease: Secondary | ICD-10-CM | POA: Diagnosis not present

## 2021-09-03 DIAGNOSIS — E1122 Type 2 diabetes mellitus with diabetic chronic kidney disease: Secondary | ICD-10-CM | POA: Diagnosis not present

## 2021-09-03 DIAGNOSIS — E875 Hyperkalemia: Secondary | ICD-10-CM | POA: Diagnosis not present

## 2021-09-06 ENCOUNTER — Telehealth: Payer: Self-pay

## 2021-09-06 ENCOUNTER — Ambulatory Visit (INDEPENDENT_AMBULATORY_CARE_PROVIDER_SITE_OTHER): Payer: Medicare Other | Admitting: Internal Medicine

## 2021-09-06 ENCOUNTER — Encounter: Payer: Self-pay | Admitting: Internal Medicine

## 2021-09-06 ENCOUNTER — Other Ambulatory Visit: Payer: Self-pay

## 2021-09-06 VITALS — BP 120/70 | HR 71 | Ht 61.0 in | Wt 127.7 lb

## 2021-09-06 DIAGNOSIS — R739 Hyperglycemia, unspecified: Secondary | ICD-10-CM | POA: Diagnosis not present

## 2021-09-06 DIAGNOSIS — E1122 Type 2 diabetes mellitus with diabetic chronic kidney disease: Secondary | ICD-10-CM

## 2021-09-06 DIAGNOSIS — E785 Hyperlipidemia, unspecified: Secondary | ICD-10-CM | POA: Diagnosis not present

## 2021-09-06 DIAGNOSIS — E1142 Type 2 diabetes mellitus with diabetic polyneuropathy: Secondary | ICD-10-CM | POA: Insufficient documentation

## 2021-09-06 DIAGNOSIS — N1831 Chronic kidney disease, stage 3a: Secondary | ICD-10-CM

## 2021-09-06 DIAGNOSIS — E118 Type 2 diabetes mellitus with unspecified complications: Secondary | ICD-10-CM | POA: Diagnosis not present

## 2021-09-06 DIAGNOSIS — Z794 Long term (current) use of insulin: Secondary | ICD-10-CM

## 2021-09-06 LAB — POCT GLYCOSYLATED HEMOGLOBIN (HGB A1C): Hemoglobin A1C: 7.4 % — AB (ref 4.0–5.6)

## 2021-09-06 MED ORDER — EMPAGLIFLOZIN 10 MG PO TABS: 10.0000 mg | ORAL_TABLET | Freq: Every day | ORAL | 6 refills | Status: DC

## 2021-09-06 MED ORDER — ROSUVASTATIN CALCIUM 10 MG PO TABS: 10.0000 mg | ORAL_TABLET | ORAL | 3 refills | Status: DC

## 2021-09-06 MED ORDER — GLIPIZIDE 5 MG PO TABS: 2.5000 mg | ORAL_TABLET | Freq: Every day | ORAL | 2 refills | Status: DC

## 2021-09-06 MED ORDER — METFORMIN HCL 500 MG PO TABS: 500.0000 mg | ORAL_TABLET | Freq: Two times a day (BID) | ORAL | 3 refills | Status: DC

## 2021-09-06 NOTE — Telephone Encounter (Signed)
Patient notified and will pick up medication  

## 2021-09-06 NOTE — Progress Notes (Addendum)
Name: Veronica Shaw  MRN/ DOB: [id number], August 26, 1950   Age/ Sex: 71 y.o., female    PCP: Lyndee Hensen, DO   Reason for Endocrinology Evaluation: Type 2 Diabetes Mellitus     Date of Initial Endocrinology Visit: 09/06/2021     PATIENT IDENTIFIER: [name] is a 71 y.o. female with a past medical history of T2Dm, NICM, Hx of CHF, Asthma, parkinson's disease. The patient presented for initial endocrinology clinic visit on 09/06/2021 for consultative assistance with her diabetes management.    HPI: [name] was    Diagnosed with DM >15 yrs  Prior Medications tried/Intolerance: N/A Currently checking blood sugars occasionally   Hypoglycemia episodes : no            Hemoglobin A1c has ranged from 6.6% in 2020, peaking at 7.6% in 2022. Patient required assistance for hypoglycemia: no Patient has required hospitalization within the last 1 year from hyper or hypoglycemia: no  In terms of diet, the patient eats 3 meals a day, avoids snacking . She avoids sugar- sweetened beverages    Follows with Dr. Estanislado Pandy for OA  Follows with Dr. Vaughan Browner for Asthma  Follows with Dr. Carles Collet for Parkinson's disease   She has constant feet pain and altered sensation . She was restarted on Gabapentin a few days ago      [place]: Metformin 500 mg , 1 tablet Twice a day      Statin: yes ACE-I/ARB: yes    METER DOWNLOAD SUMMARY: unable to download 30 day average 165 mg/dL   132- 294 mg/dL    DIABETIC COMPLICATIONS: Microvascular complications:  CKD III, neuropathy  Denies: retinopathy  Last eye exam: Completed 2022  Macrovascular complications:   Denies: CAD, PVD, CVA   PAST HISTORY: Past Medical History:  Past Medical History:  Diagnosis Date   Acute on chronic systolic CHF (congestive heart failure), NYHA class 4 ([place]) 10/19/2020   Asthma    Back pain 09/18/2019   Cervical disc disorder with radiculopathy of cervical region 09/24/2018    Chronic systolic CHF (congestive heart failure) ([place])    a. cMRI 4/15: EF 34% and findings - c/w NICM, normal RV size and function (RVEF 61%), Mild MR // b. Echo 2/15:  EF 30-35%, diff HK, ant-sept AK, Gr 2 DD, mild MR, trivial TR  //  c. Echo 5/17: EF 20-25%, severe diffuse HK, marked systolic dyssynchrony, grade 1 diastolic dysfunction, mild MR  //  d. [place] 5/17: Fick CO 2.9, RVSP 19, PASP 15, PW mean 2, low filing pressures and preserved CO    Cognitive impairment 10/19/2017   MOCA was administered with a score of 23/30   Diabetes mellitus    Diabetic peripheral neuropathy ([place]) 12/09/2011   Dyspnea 09/19/2012   Flu 10/17/2017   Gastritis    History of echocardiogram    Echo 6/18: EF 30-35, diffuse HK, grade 1 diastolic dysfunction, trivial MR, mild LAE, mild TR, no pericardial effusion   History of nuclear stress test    Myoview 5/18: EF 49, no ischemia, inferoseptal defect c/w LBBB artifact (intermediate risk due to EF < 50).   HTN (hypertension)    Hyperlipidemia    Hyperlipidemia associated with type 2 diabetes mellitus ([place]) 03/05/2019   Hypertension associated with diabetes ([place]) 09/28/2006   Qualifier: Diagnosis of  By: [name]     Major depression 03/31/2013   Melena 08/21/2020   NICM (nonischemic cardiomyopathy) ([place])    a. Nuclear 5/13: Normal  stress nuclear study. LV Ejection Fraction: 58%  //  b. LHC 10/14: Minor luminal irregularity in prox LAD, EF 35%    Parkinson disease (HCC)    Plantar fasciitis    Plantar fasciitis, bilateral 06/15/2012   Rosacea 05/29/2009   Qualifier: Diagnosis of  By: [name] MD, [name] hearing loss (SNHL), bilateral 01/04/2017   Sleep apnea    was retested and no longer had it and so d/c CPAP   Syncope    Thoracic or lumbosacral neuritis or radiculitis, unspecified 07/03/2013   Tinnitus, bilateral 01/04/2017   Type II diabetes mellitus with complication ([place]) 2/67/1245   Diabetic eye exam done by [facility]: Dr.  Prudencio Burly on 11/08/13: no diabetic retinopathy. Repeat in 1 year     Urticaria    Past Surgical History:  Past Surgical History:  Procedure Laterality Date   BREAST EXCISIONAL BIOPSY Left 1970   benign cyst   BREAST SURGERY Left    BUNIONECTOMY     CARDIAC CATHETERIZATION N/A 12/16/2015   Procedure: Right Heart Cath;  Surgeon: Sherren Mocha, MD;  Location: [facility];  Service: Cardiovascular;  Laterality: N/A;   CHOLECYSTECTOMY     eye lid surgery Bilateral 05/09/2019   IMPLANTABLE CARDIOVERTER DEFIBRILLATOR IMPLANT  11-25-13   MDT dual chamber ICD implanted by [name] for primary prevention   IMPLANTABLE CARDIOVERTER DEFIBRILLATOR IMPLANT N/A 11/25/2013   Procedure: IMPLANTABLE CARDIOVERTER DEFIBRILLATOR IMPLANT;  Surgeon: [name], MD;  Location: [facility] CATH LAB;  Service: Cardiovascular;  Laterality: N/A;   LEFT AND RIGHT HEART CATHETERIZATION WITH CORONARY ANGIOGRAM N/A 05/31/2013   Procedure: LEFT AND RIGHT HEART CATHETERIZATION WITH CORONARY ANGIOGRAM;  Surgeon: [name], MD;  Location: [facility] CATH LAB;  Service: Cardiovascular;  Laterality: N/A;   TONSILLECTOMY     TUBAL LIGATION      Social History:  reports that she has never smoked. She has never used smokeless tobacco. She reports that she does not drink alcohol and does not use drugs. Family History:  Family History  Problem Relation Age of Onset   Coronary artery disease Father        Died age 40   Heart attack Father    Diabetes Father    Coronary artery disease Mother        Died age 76   Heart attack Mother    Diabetes Mother    Parkinson's disease Sister    Heart disease Sister    Hepatitis C Sister    Diabetes Sister    Diabetes Son 2       T1DM   Healthy Daughter    Diabetes Sister    Heart disease Sister    Diabetes Sister    Heart disease Sister    Breast cancer Neg Hx      HOME MEDICATIONS: Allergies as of 09/06/2021       Reactions   Aspirin Swelling, Other (See Comments)   Other  reaction(s): Other (See Comments) Causes nose bleeds *ONLY THE COATED ASA* Causes nose bleeds *ONLY THE COATED ASA*   Penicillins Shortness Of Breath, Rash   Shortness of Breath - Throat felt like it was closing.    Prempro [conj Estrog-medroxyprogest Ace] Shortness Of Breath   Throat swelling Throat swelling   Ace Inhibitors Cough   Simvastatin Other (See Comments), Rash   Muscle aches        Medication List        Accurate as of September 06, 2021  9:56 AM. If you have any questions, ask your nurse or doctor.          acetaminophen 500 MG tablet Commonly known as: TYLENOL Take 2 tablets (1,000 mg total) by mouth every 8 (eight) hours as needed.   APPLE CIDER VINEGAR PO Take 1 capsule by mouth daily.   Biotin 5000 MCG Caps Take 1 tablet by mouth daily.   budesonide-formoterol 160-4.5 MCG/ACT inhaler Commonly known as: Symbicort Inhale 2 puffs into the lungs 2 (two) times daily.   carbidopa-levodopa 25-100 MG tablet Commonly known as: SINEMET IR Take 1 tablet by mouth 3 (three) times daily.   diclofenac Sodium 1 % Gel Commonly known as: VOLTAREN Apply 4 g topically 4 (four) times daily.   famotidine 20 MG tablet Commonly known as: PEPCID TAKE 1 TABLET TWICE DAILY   Flonase Sensimist 27.5 MCG/SPRAY nasal spray Generic drug: fluticasone Place 2 sprays into the nose daily.   furosemide 20 MG tablet Commonly known as: LASIX Take 20 mg by mouth daily.   gabapentin 100 MG capsule Commonly known as: NEURONTIN Take 100 mg by mouth daily.   levalbuterol 0.63 MG/3ML nebulizer solution Commonly known as: XOPENEX USE 1 VIAL IN NEBULIZER EVERY 4 TO 6 HOURS AS NEEDED FOR WHEEZING AND FOR SHORTNESS OF BREATH   losartan 25 MG tablet Commonly known as: COZAAR Take 0.5 tablets (12.5 mg total) by mouth daily.   meclizine 12.5 MG tablet Commonly known as: ANTIVERT Take 1 tablet (12.5 mg total) by mouth daily as needed for dizziness. Should not be long term.    melatonin 5 MG Tabs Take 5 mg by mouth at bedtime.   metFORMIN 500 MG tablet Commonly known as: GLUCOPHAGE TAKE 2 TABLETS BY MOUTH TWICE DAILY WITH A MEAL   metoprolol succinate 50 MG 24 hr tablet Commonly known as: TOPROL-XL Take 1 tablet (50 mg total) by mouth 2 (two) times daily.   montelukast 10 MG tablet Commonly known as: SINGULAIR Take 1 tablet (10 mg total) by mouth at bedtime.   OXYGEN Inhale into the lungs at bedtime. Inhale 2L into lungs   potassium chloride 10 MEQ tablet Commonly known as: KLOR-CON Take 10 mEq by mouth daily as needed (low potassium).   rosuvastatin 10 MG tablet Commonly known as: CRESTOR TAKE 1 TABLET BY MOUTH TWICE A WEEK   spironolactone 25 MG tablet Commonly known as: ALDACTONE TAKE 1/2 TABLET EVERY DAY   tiZANidine 2 MG tablet Commonly known as: ZANAFLEX TAKE 1 TABLET BY MOUTH EVERY 8 HOURS AS NEEDED FOR MUSCLE SPASM   ULTRA COQ10 PO Take 1 tablet by mouth 3 (three) times a week.         ALLERGIES: Allergies  Allergen Reactions   Aspirin Swelling and Other (See Comments)    Other reaction(s): Other (See Comments) Causes nose bleeds *ONLY THE COATED ASA* Causes nose bleeds *ONLY THE COATED ASA*   Penicillins Shortness Of Breath and Rash    Shortness of Breath - Throat felt like it was closing.    Prempro [Conj Estrog-Medroxyprogest Ace] Shortness Of Breath    Throat swelling Throat swelling   Ace Inhibitors Cough   Simvastatin Other (See Comments) and Rash    Muscle aches     REVIEW OF SYSTEMS: A comprehensive ROS was conducted with the patient and is negative except as per HPI and below:  ROS    OBJECTIVE:   VITAL SIGNS: BP 120/70 (BP Location: Left Arm, Patient Position: Sitting, Cuff Size: Small)  Pulse 71    Ht 5\' 1"  (1.549 m)    Wt 127 lb 11.2 oz (57.9 kg)    LMP 08/01/2000 (Approximate)    SpO2 96%    BMI 24.13 kg/m    PHYSICAL EXAM:  General: Pt appears well and is in NAD  Neck: General: Supple  without adenopathy or carotid bruits. Thyroid: Thyroid size normal.  No goiter or nodules appreciated.   Lungs: Clear with good BS bilat with no rales, rhonchi, or wheezes  Heart: RRR with normal S1 and S2 and no gallops; no murmurs; no rub  Extremities:  Lower extremities - No pretibial edema. No lesions.  Neuro: MS is good with appropriate affect, pt is alert and Ox3      DATA REVIEWED:  Lab Results  Component Value Date   HGBA1C 7.4 (A) 09/06/2021   HGBA1C 7.4 (A) 05/07/2021   HGBA1C 7.6 (A) 01/22/2021   Lab Results  Component Value Date   LDLCALC 129 (H) 01/22/2021   CREATININE 1.11 (H) 08/20/2021   Lab Results  Component Value Date   MICRALBCREAT <30 Normal 06/29/2010    Lab Results  Component Value Date   CHOL 199 01/22/2021   HDL 48 01/22/2021   LDLCALC 129 (H) 01/22/2021   LDLDIRECT 111 (H) 02/10/2015   TRIG 123 01/22/2021   CHOLHDL 4.1 01/22/2021        ASSESSMENT / PLAN / RECOMMENDATIONS:   1) Type 2 Diabetes Mellitus, sub-- optimally controlled, With CKD III and neuropathic complications - Most recent A1c of 7.4%. Goal A1c < 7.0 %.    Plan: GENERAL: I have discussed with the patient the pathophysiology of diabetes. We went over the natural progression of the disease. We stressed the importance of lifestyle changes including diet and exercise. I explained the complications associated with diabetes including retinopathy, nephropathy, neuropathy as well as increased risk of cardiovascular disease. We went over the benefit seen with glycemic control.  She has no intolerance issues to metformin but has had so many negative things about it and would rather not take the max dose of 4 tablets daily.  We did discuss the risk and the benefits of metformin intake We also discussed the risk and benefit of SGLT 2 inhibitors as well as sulfonylureas.  She understands the risk of genital infections with SGLT2 inhibitors, if this is cost prohibitive then we decided to add  glipizide half a tablet before breakfast.  I do prefer SGLT2 inhibitors given her risk of CKD 3 and CHF The patient is noted with hyperglycemia after breakfast with BG's over 200 mg/DL Unfortunately, Jardiance is cost prohibitive , will prescribe Glipizide with the goal of taking her off Metformin as she doesn;t like it   MEDICATIONS: Continue metformin 500 mg twice daily Start Glipizide 5 mg, half a tablet before breakfast   EDUCATION / INSTRUCTIONS: BG monitoring instructions: Patient is instructed to check her blood sugars 1 times a day, fasting. Call [facility] [place] [facility] if: BG persistently < 70  I reviewed the Rule of 15 for the treatment of hypoglycemia in detail with the patient. Literature supplied.   2) Diabetic complications:  Eye: Does not have known diabetic retinopathy.  Neuro/ Feet: Does  have known diabetic peripheral neuropathy. Renal: Patient does  have known baseline CKD. She is  on an ACEI/ARB at present.   3) Dyslipidemia:   - LDL above goal  but she takes rosuvastatin twice weekly due to intolerance to Simvastatin  -We discussed cardiovascular benefits of  statins she will try to increase this to 3 times a week    Increase Rosuvastatin to Three times a week     Follow-up in 4 months    Signed electronically by: Mack Guise, MD  [facility]  [address] [place], [place] [address] Phone: [phone] FAX: [phone]   CC: Lyndee Hensen, [place] [address] Phone: [phone]  Fax: [phone]    Return to Endocrinology clinic as below: Future Appointments  Date Time Provider [place]  09/20/2021 10:00 AM [facility]-CASE MANAGER [facility] [place]  09/27/2021  7:15 AM [facility] DEVICE REMOTES [facility]  10/19/2021  8:15 AM Tat, Eustace Quail, DO [facility] None  11/25/2021  8:10 AM [facility] DEVICE REMOTES [facility]   12/22/2021  9:20 AM Sherren Mocha, MD [facility]  02/07/2022  9:20 AM Ofilia Neas, PA-C [facility] None  02/24/2022  8:10 AM [facility] DEVICE REMOTES [facility]  05/26/2022  8:10 AM [facility] DEVICE REMOTES [facility]  08/25/2022  8:10 AM [facility] DEVICE REMOTES [facility]

## 2021-09-06 NOTE — Addendum Note (Signed)
Addended by: Dorita Sciara on: 09/06/2021 03:20 PM   Modules accepted: Orders

## 2021-09-06 NOTE — Patient Instructions (Signed)
-   Continue Metformin 500 mg , 1 tablet twice daily  - Start jardiance 10 mg, 1 tablet daily ( if this is expensive , let me know and will start Glipizide )    - Increase Rosuvastatin 10 mg to THREE times a week

## 2021-09-06 NOTE — Progress Notes (Signed)
Remote ICD transmission.   

## 2021-09-06 NOTE — Telephone Encounter (Signed)
Patient states that Jardiance cost $300 and she can't afford that.

## 2021-09-08 DIAGNOSIS — H26492 Other secondary cataract, left eye: Secondary | ICD-10-CM | POA: Diagnosis not present

## 2021-09-08 DIAGNOSIS — H04123 Dry eye syndrome of bilateral lacrimal glands: Secondary | ICD-10-CM | POA: Diagnosis not present

## 2021-09-08 DIAGNOSIS — H01002 Unspecified blepharitis right lower eyelid: Secondary | ICD-10-CM | POA: Diagnosis not present

## 2021-09-08 DIAGNOSIS — H10413 Chronic giant papillary conjunctivitis, bilateral: Secondary | ICD-10-CM | POA: Diagnosis not present

## 2021-09-20 ENCOUNTER — Telehealth: Payer: Medicare Other

## 2021-09-24 ENCOUNTER — Telehealth: Payer: Self-pay

## 2021-09-24 ENCOUNTER — Telehealth: Payer: Medicare Other

## 2021-09-24 NOTE — Telephone Encounter (Signed)
° °  RN Case Manager Care Management   Phone Outreach    09/24/2021 Name: Veronica Shaw MRN: [id number] DOB: 16-Mar-1951  Veronica Shaw is a 71 y.o. year old female who is a primary care patient of Lyndee Hensen, DO .   Telephone outreach was unsuccessful A HIPPA compliant phone message was left for the patient providing contact information and requesting a return call.   Follow Up Plan: Will route chart to Care Guide to see if patient would like to reschedule phone appointment.    Review of patient status, including review of consultants reports, relevant laboratory and other test results, and collaboration with appropriate care team members and the patient's provider was performed as part of comprehensive patient evaluation and provision of care management services.    Lazaro Arms RN, BSN, [facility] Care Management Coordinator [place] Phone: [phone] Fax: [phone]

## 2021-09-27 ENCOUNTER — Ambulatory Visit (INDEPENDENT_AMBULATORY_CARE_PROVIDER_SITE_OTHER): Payer: Medicare Other

## 2021-09-27 DIAGNOSIS — I5022 Chronic systolic (congestive) heart failure: Secondary | ICD-10-CM | POA: Diagnosis not present

## 2021-09-27 DIAGNOSIS — Z95810 Presence of automatic (implantable) cardiac defibrillator: Secondary | ICD-10-CM | POA: Diagnosis not present

## 2021-10-01 ENCOUNTER — Telehealth: Payer: Self-pay

## 2021-10-01 NOTE — Telephone Encounter (Signed)
Remote ICM transmission received.  Attempted call to patient regarding ICM remote transmission and left detailed message per DPR.  Advised to return call for any fluid symptoms or questions. Next ICM remote transmission scheduled 11/01/2021.   ? ?

## 2021-10-01 NOTE — Progress Notes (Signed)
EPIC Encounter for ICM Monitoring  Patient Name: Veronica Shaw is a 71 y.o. female Date: 10/01/2021 Primary Care Physican: Lyndee Hensen, DO Primary Cardiologist: Cooper/Weaver PA Electrophysiologist: Lovena Le 08/20/2021 Office Weight: 129 lbs                                       Attempted call to patient and unable to reach.  Left detailed message per DPR regarding transmission. Transmission reviewed.    OptiVol Thoracic impedance normal.   Prescribed: Furosemide 20 mg Take 1 tablet (20 mg total) by mouth daily.     Labs: 08/20/2021 Creatinine 1.11, BUN 21, Potassium 4.3, Sodium 140, GFR 53 04/20/2021 Creatinine 1.24, BUN 13, Potassium 4.6, Sodium 140, GFR 46 01/22/2021 Creatinine 1.50, BUN 29, Potassium 4.6, Sodium 144 01/18/2021 Creatinine 1.39, BUN 23, Potassium 4.6, Sodium 137, GFR 41  A complete set of results can be found in Results Review.   Recommendations:  Left voice mail with ICM number and encouraged to call if experiencing any fluid symptoms.   Follow-up plan: ICM clinic phone appointment on 11/01/2021.   91 day device clinic remote transmission 11/25/2021.     EP/Cardiology Office Visits:   Recalls 01/17/2022 for [name].    3 month ICM trend: 09/27/2021.    12-14 Month ICM trend:     [name], RN 10/01/2021 3:30 PM

## 2021-10-01 NOTE — Telephone Encounter (Signed)
Rescheduled 10/14/21 ? ?Veronica Shaw  ?Care Guide, Embedded Care Coordination ?[place]  Care Management  ?Direct Dial: [phone] ? ?

## 2021-10-02 ENCOUNTER — Other Ambulatory Visit: Payer: Self-pay | Admitting: Family Medicine

## 2021-10-02 DIAGNOSIS — G8929 Other chronic pain: Secondary | ICD-10-CM

## 2021-10-05 ENCOUNTER — Ambulatory Visit (INDEPENDENT_AMBULATORY_CARE_PROVIDER_SITE_OTHER): Payer: Medicare Other | Admitting: Family Medicine

## 2021-10-05 ENCOUNTER — Other Ambulatory Visit: Payer: Self-pay

## 2021-10-05 VITALS — BP 119/53 | HR 82 | Ht 61.0 in | Wt 124.8 lb

## 2021-10-05 DIAGNOSIS — R300 Dysuria: Secondary | ICD-10-CM | POA: Diagnosis not present

## 2021-10-05 DIAGNOSIS — N390 Urinary tract infection, site not specified: Secondary | ICD-10-CM | POA: Diagnosis not present

## 2021-10-05 LAB — POCT UA - MICROSCOPIC ONLY

## 2021-10-05 LAB — POCT URINALYSIS DIP (MANUAL ENTRY)
Bilirubin, UA: NEGATIVE
Blood, UA: NEGATIVE
Glucose, UA: NEGATIVE mg/dL
Ketones, POC UA: NEGATIVE mg/dL
Nitrite, UA: NEGATIVE
Protein Ur, POC: NEGATIVE mg/dL
Spec Grav, UA: 1.015 (ref 1.010–1.025)
Urobilinogen, UA: 0.2 E.U./dL
pH, UA: 5.5 (ref 5.0–8.0)

## 2021-10-05 MED ORDER — NITROFURANTOIN MONOHYD MACRO 100 MG PO CAPS: 100.0000 mg | ORAL_CAPSULE | Freq: Two times a day (BID) | ORAL | 0 refills | Status: AC

## 2021-10-05 NOTE — Progress Notes (Signed)
? ? ?  SUBJECTIVE:  ? ?CHIEF COMPLAINT / HPI:  ? ?Concern for UTI: ?Symptoms started Thursday with frequent urination and some dysuria. Some suprapubic discomfort. She completed nitrofurantonin about a month ago.  She states that she gets frequent UTIs often 4 or 5/year and request a referral for urology.  She states that she has had to have her antibiotics changed in the past based off of urinary cultures. ? ?PERTINENT  PMH / PSH: Frequent UTIs ? ?OBJECTIVE:  ? ?BP (!) 119/53   Pulse 82   Ht '5\' 1"'$  (1.549 m)   Wt 124 lb 12.8 oz (56.6 kg)   LMP 08/01/2000 (Approximate)   SpO2 99%   BMI 23.58 kg/m?   ? ?General: NAD, pleasant, able to participate in exam ?Cardiac: RRR, no murmurs. ?Respiratory: No respiratory distress ?Abdomen: She does have some suprapubic discomfort ?Neuro: alert, no obvious focal deficits ?Psych: Normal affect and mood ? ?ASSESSMENT/PLAN:  ? ?Urinary tract infection ?Urinalysis shows moderate leukocytes with no nitrite and moderate bacteria.  Given patient's symptoms of dysuria, urinary frequency, suprapubic discomfort I do think is reasonable to treat again.  Based off her most recent culture which grew E. coli which had some resistances.  She did get improvement from nitrofurantoin at that time.  This was just over a month ago.  Discussed with pharmacy and it is reasonable to proceed with another trial of nitrofurantoin for this.  She request a referral for urology for her frequent UTIs and so I will give this as well. ? ? ?Lurline Del, DO ?[place]  ? ? ? ?

## 2021-10-05 NOTE — Patient Instructions (Signed)
I am prescribing nitrofurantoin for your urinary tract infection.  If you have any further questions please let me know.  If this does not improve your symptoms please let me know.  I am ordering a urine culture and will let you know the results when it returns. ? ?I would like for you to schedule follow-up appointment with your PCP in the next 1 to 2 weeks for an annual physical ?

## 2021-10-06 DIAGNOSIS — I10 Essential (primary) hypertension: Secondary | ICD-10-CM | POA: Diagnosis not present

## 2021-10-06 DIAGNOSIS — E118 Type 2 diabetes mellitus with unspecified complications: Secondary | ICD-10-CM | POA: Diagnosis not present

## 2021-10-06 DIAGNOSIS — J45909 Unspecified asthma, uncomplicated: Secondary | ICD-10-CM | POA: Diagnosis not present

## 2021-10-06 DIAGNOSIS — N183 Chronic kidney disease, stage 3 unspecified: Secondary | ICD-10-CM | POA: Diagnosis not present

## 2021-10-08 LAB — URINE CULTURE

## 2021-10-14 ENCOUNTER — Telehealth: Payer: Self-pay

## 2021-10-14 ENCOUNTER — Telehealth: Payer: Medicare Other

## 2021-10-14 NOTE — Telephone Encounter (Signed)
? ?  RN Case Manager ?Care Management  ? Phone Outreach  ? ? ?10/14/2021 ?Name: Pola Furno MRN: [id number] DOB: 07-28-1951 ? ?Brittanni Jnae Thomaston is a 71 y.o. year old female who is a primary care patient of Lyndee Hensen, DO .  ? ?A HIPPA compliant phone message was left for the patient providing contact information and requesting a return call.  2nd unsuccessful telephone outreach attempt.  If unable to reach patient by phone on the 3rd attempt, will discontinue outreach calls but will be available at any time to provide services.  ? ?Follow Up Plan: Will route chart to Care Guide to see if patient would like to reschedule phone appointment.   ? ?Review of patient status, including review of consultants reports, relevant laboratory and other test results, and collaboration with appropriate care team members and the patient's provider was performed as part of comprehensive patient evaluation and provision of care management services.   ? ?Lazaro Arms RN, BSN, [facility] ?Care Management Coordinator ?[place] ?Phone: [phone] Fax: [phone] ?  ? ? ? ? ? ? ? ? ? ? ? ?
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
## 2021-10-18 ENCOUNTER — Ambulatory Visit: Payer: Medicare Other

## 2021-10-19 ENCOUNTER — Other Ambulatory Visit: Payer: Self-pay

## 2021-10-19 ENCOUNTER — Ambulatory Visit (INDEPENDENT_AMBULATORY_CARE_PROVIDER_SITE_OTHER): Payer: Medicare Other | Admitting: Neurology

## 2021-10-19 ENCOUNTER — Encounter: Payer: Self-pay | Admitting: Neurology

## 2021-10-19 VITALS — BP 116/62 | HR 81 | Ht 61.0 in | Wt 123.8 lb

## 2021-10-19 DIAGNOSIS — G20 Parkinson's disease: Secondary | ICD-10-CM | POA: Diagnosis not present

## 2021-10-19 MED ORDER — MIRTAZAPINE 15 MG PO TABS: 15.0000 mg | ORAL_TABLET | Freq: Every day | ORAL | 1 refills | Status: DC

## 2021-10-19 MED ORDER — CARBIDOPA-LEVODOPA ER 50-200 MG PO TBCR: 1.0000 | EXTENDED_RELEASE_TABLET | Freq: Every day | ORAL | 1 refills | Status: DC

## 2021-10-19 MED ORDER — CARBIDOPA-LEVODOPA 25-100 MG PO TABS: 1.0000 | ORAL_TABLET | Freq: Three times a day (TID) | ORAL | 1 refills | Status: DC

## 2021-10-19 NOTE — Patient Instructions (Addendum)
Week 1: ?Take carbidopa/levodopa 25/100 at 9am/1pm/5pm ?carbidopa/levodopa 50/20 at bed ? ?Week 2 and beyond: ?Take carbidopa/levodopa 25/100 at 9am/1pm/5pm ?Take carbidopa/levodopa 50/200 at bed ?Take mirtazapine 15 mg at bed ? ?NO DRIVING! ? ?Local and Online Resources for Power over Parkinson's Group ?March 2023 ? ?LOCAL Hinds PARKINSON'S GROUPS  ?Power over Parkinson's Group :   ?Power Over Parkinson's Patient Education Group will be Wednesday, March 8th-*Hybrid meting*- in person at [place] location and via [facility] at 2:00 pm.   ?Upcoming Power over Parkinson's Meetings:  2nd Wednesdays of the month at 2 pm:  March 8th, April 12th ?Contact Amy Marriott at [email or web address] if interested in participating in this group ?Parkinson's Care Partners Group:    3rd Mondays, Contact Misty Paladino ?Atypical Parkinsonian Patient Group:   4th Wednesdays, Contact Misty Paladino ?If you are interested in participating in these groups with Misty, please contact her directly for how to join those meetings.  Her contact information is [email or web address].   ? ?LOCAL EVENTS AND NEW OFFERINGS ?Parkinson's Wellness Event:  Pottery Night at [facility] [place].  Friday, March 10th 5:30-7:30 pm.  Sponsored by [employer] [place].  FREE event for people with Parkinson's and care partners.  RSVP to [facility] at [email or web address] ?Dine out at [employer].  Celebrate Parkinson's disease Awareness Month and Support the Parkinson's Movement Disorder Fund.   Wednesday, April 19th 4-6 pm at [place], [employer].  (Give receipt to cashier and 20% will be donated) ?Parkinson's T-shirts for sale!  Designed by a local group member, with funds going to [place].  $20.00  Contact Misty to purchase (see email above) ?New PWR! Moves Class offering at [employer]!  Fridays 1-2 pm in March (likely to switch days and times after March).  Come try it out and see if  PWR! Moves is a good fit for your exercise routine!  Contact Amy Marriott for details:  [email or web address] ?Hamil-Kerr Challenge Bike, Run, Walk for PD, PSP, MSA.    Saturday, April 8th at 8 am.  [place]  Proceeds go to offset costs of exercise programs locally.  To Register, visit website:  www.[email or web address] ? ?ONLINE EDUCATION AND SUPPORT ?[place]:  www.parkinson.org ?PD Health at Home continues:  Mindfulness Mondays, Expert Briefing Tuesdays, Wellness Wednesdays, Take Time Thursdays, Fitness Fridays  ?Upcoming Education:  ?Parkinson's and Medications:  What's New.  Wednesday, March 8th at 1:00 pm. ?Freezing and Fall Prevention in Parkinson's.  Wednesday, April 12th at 1:00 pm ?Register for Armed forces operational officer) at WatchCalls.si ?[place] Chapter Parkinson's Symposium: Cognition Changes- a free in-person ([place], [place]) and online Audiological scientist) event for people with Parkinson's and their loved ones. During this event we will explore new treatments and practical strategies for addressing these changes.  Saturday, April 1st, 10 am-1 pm.  Register at [employer].[email or web address]/ or contact Dorothy Puffer Gruccio at [phone] or [email or web address]. ? Please check out their website to sign up for emails and see their full online offerings ? ?[place]:  www.[email or web address]  ?Third Thursday Webinars:  On the third Thursday of every month at 12 p.m. ET, join our free live webinars to learn about various aspects of living with Parkinson's disease and our work to speed medical breakthroughs. ?Upcoming Webinar:  The Power of Women in Philanthropy.  Tues, March 28th at  12 pm. ?Check out additional information on their website to see their full online offerings ? ?[place]:  www.[email or web address] ?Upcoming Webinar:   Stay tuned ?Webinar Series:  Living with Parkinson's Meetup.   Third Thursdays of each month at 3 pm ?Care Partner Monthly Meetup.  With Robin Searing Phinney.  First Tuesday of each month, 2 pm ?Check out additional information to Live Well Today on their website ? ?Parkinson and Movement Disorders (PMD) Alliance:  www.pmdalliance.org ?NeuroLife Online:  Online Education Events ?Sign up for emails, which are sent weekly to give you updates on programming and online offerings ? ?Parkinson's Association of the [place]:  www.parkinsonassociation.org ?Information on online support groups, education events, and online exercises including Yoga, Parkinson's exercises and more-LOTS of information on links to PD resources and online events ?Virtual Support Group through [employer] of the [place]; next one is scheduled for Wednesday, *April 5th at 2 pm. *March meeting has been cancelled. (These are typically scheduled for the 1st Wednesday of the month at 2 pm).  Visit website for details. ?MOVEMENT AND EXERCISE OPPORTUNITIES ?Parkinson's DRUMMING Classes/Music Therapy with Doylene Canning:  This is a returning class and it's FREE!  2nd Mondays, continuing March 13th , 11:00 at the [place].  Contact *Misty Taylor-Paladino at [email or web address] or Doylene Canning at [phone] or [email or web address]  ?PWR! Moves Classes at [place].  Wednesdays 10 and 11 am.   NEW PWR! Moves Class offering at [employer].  Fridays 1-2 pm.  Contact Amy Marriott, PT [email or web address] if interested. ?Here is a link to the PWR!Moves classes on Zoom from [place] - Daily Mon-Sat at 10:00. Via Zoom, FREE and open to all.  There is also a link below via Facebook if you use that  platform. ?[email or web address].si ?https://www.[email or web address] ? ?Parkinson's Wellness Recovery (PWR! Moves)  www.pwr4life.org ?Info on the PWR! Virtual Experience:  You will have access to our expertise through self-assessment, guided plans that start with the PD-specific fundamentals, educational content, tips, Q&A with an expert, and a growing Art therapist of PD-specific pre-recorded and live exercise classes of varying types and intensity - both physical and cognitive! If that is not enough, we offer 1:1 wellness consultations (in-person or virtual) to personalize your PWR! Research scientist (medical).  ?[employer] Fridays:  ?As part of the PD Health @ Home program, this free video series focuses each week on one aspect of fitness designed to support people living with Parkinson's.  These weekly videos highlight the [place] recent fitness guidelines for people with Parkinson's disease. ?www.KVTVnet.com.cy ?Dance for PD website is offering free, live-stream classes throughout the week, as well as links to [employer] of classes:  https://[email or web address]/ ?Dance for Parkinson's in-person class.  February 1-April 26, Wednesdays 4-5 pm.  Free class for people with Parkinson's disease, at [address] [place], [place].  Contact [phone] or [email or web address] to register ?Virtual dance and Pilates for Parkinson's classes: Click on the Community Tab> Parkinson's Movement Initiative Tab.  To register for classes and for more information, visit www.SeekAlumni.co.za and click the ?community? tab.  ?[employer] Parkinson's Cycling Classes  ?[employer]:  Thursdays @ Noon-Live classes at [employer] ([employer] at [email or web address] or  [phone]) ?[employer]: Virtual Classes Mondays and Thursdays Jeanette Caprice classes Tuesday, Wednesday and Thursday (contact Talihina at [email or web address]  or [phone]) ?[employer] ?Varied levels of classes are offered Mondays, Tuesdays and Thursdays at [employer].  ?Stretching with Verdis Frederickson weekly class is also offered

## 2021-10-19 NOTE — Chronic Care Management (AMB) (Signed)
?Chronic Care Management  ? ?CCM RN Visit Note ? ?10/19/2021 ?Name: Veronica Shaw MRN: [id number] DOB: Oct 19, 1950 ? ?Subjective: ?Veronica Shaw is a 71 y.o. year old female who is a primary care patient of Lyndee Hensen, DO. The care management team was consulted for assistance with disease management and care coordination needs.   ? ?Engaged with patient by telephone for follow up visit in response to provider referral for case management and/or care coordination services.  ? ? ? ?Summary:  The patient  continues to maintain positive progress with care plan goals.  She is having urinary symptoms and is waiting for call from [facility]. See Care Plan below for interventions and patient self-care actives. ? ?Recommendation: The patient may benefit from taking medications as prescribed, monitoring food intake, watch fluid intake, weighing Daily and record values, and calling your physician if numbers are abnormal ? ?Follow up Plan: Patient would like continued follow-up.  CCM RNCM will outreach the patient within the next 5 weeks.  Patient will call office if needed prior to next encounter ? ? ?Consent to Services:  ?The patient was given information about Chronic Care Management services, agreed to services, and gave verbal consent prior to initiation of services.  Please see initial visit note for detailed documentation.  ? ?Patient agreed to services and verbal consent obtained.  ? ?Assessment: Review of patient past medical history, allergies, medications, health status, including review of consultants reports, laboratory and other test data, was performed as part of comprehensive evaluation and provision of chronic care management services.  ? ?SDOH (Social Determinants of Health) assessments and interventions performed:  No ? ?CCM Care Plan ? ? ? ? ?Conditions to be addressed/monitored:CHF ? ?Care Plan : RN Case Manager  ?Updates made by Lazaro Arms, RN since 10/19/2021 12:00 AM  ?  ? ?Problem:  Symptom Exacerbation (Heart Failure)   ?  ? ?Long-Range Goal: Patient to manage and monitor signs and symptoms of Heart Failure   ?Start Date: 11/03/2020  ?Expected End Date: 08/31/2021  ?Priority: High  ?Note:   ?Current Barriers:  ?Knowledge deficit related to basic heart failure pathophysiology and self care management ? ?RNCM Clinical Goal(s):  ?Patient will verbalize understanding of plan for management of CHF as evidenced by by 90 days of no admissions to the hospital  through collaboration with RN Care manager, provider, and care team.  ? ?Interventions: ?1:1 collaboration with primary care provider regarding development and update of comprehensive plan of care as evidenced by provider attestation and co-signature ?Inter-disciplinary care team collaboration (see longitudinal plan of care) ?Evaluation of current treatment plan related to  self management and patient's adherence to plan as established by provider ? ? ?Heart Failure Interventions:  (Status:  Condition stable.  Not addressed this visit.) Long Term Goal ? ?Interventions: Goal on Track ?Basic overview and discussion of  Heart Failure reviewed ?Reviewed Heart Failure Action Plan  ?Discussed importance of daily weight and advised patient to weigh and record daily- ?10/18/21: CM RN received a call from [name], and she stated that she was doing fair. She has lost some weight and is unsure why. She still does not have her taste or smell since having covid. She denies any chest pain, shortness of breath, no more than usual, or swelling. She is having some annoying irritation when she has to urinate and was seen at the office on 3/7/223 and had a urinalysis with culture. She was started on nitrofurantoin, but the culture came back  negative. The Referral coordinator sent the referral to [facility], but they did not receive a call, so I did a conference call, and we held on the line for quite some time, so I advised her that I would call back later  that day. I returned the call later that day and found they still needed to receive the referral after speaking with Maudie Mercury. I called our referral coordinator and asked her to refax the referral to [phone], and I notified the patient ? ?Patient Goals/Self Care Activities: ?-Patient/Caregiver will self-administer medications as prescribed as evidenced by self-report/primary caregiver report  ?-Patient/Caregiver will attend all scheduled provider appointments as evidenced by clinician review of documented attendance to scheduled appointments and patient/caregiver report ?-Patient/Caregiver will call pharmacy for medication refills as evidenced by patient report and review of pharmacy fill history as appropriate ?-Patient/Caregiver will call provider office for new concerns or questions as evidenced by review of documented incoming telephone call notes and patient report ?-Patient/Caregiver verbalizes understanding of plan ?-Patient/Caregiver will focus on medication adherence by taking medications as prescribed ?-Weigh daily and record (notify MD with 3 lb weight gain over night or 5 lb in a week) ?-Follow CHF Action Plan ?-Adhere to low sodium diet ?-Check your phone for call from [facility] ?  ? ?Lazaro Arms RN, BSN, [place] ?Care Management Coordinator ?[place]  ?Phone: [phone]  ?  ? ? ? ? ? ? ? ? ?

## 2021-10-19 NOTE — Patient Instructions (Signed)
Visit Information ? ?[name]  it was nice speaking with you. Please call me directly [phone] if you have questions about the goals we discussed. ? ?Patient Goals/Self Care Activities: ?-Patient/Caregiver will self-administer medications as prescribed as evidenced by self-report/primary caregiver report  ?-Patient/Caregiver will attend all scheduled provider appointments as evidenced by clinician review of documented attendance to scheduled appointments and patient/caregiver report ?-Patient/Caregiver will call pharmacy for medication refills as evidenced by patient report and review of pharmacy fill history as appropriate ?-Patient/Caregiver will call provider office for new concerns or questions as evidenced by review of documented incoming telephone call notes and patient report ?-Patient/Caregiver verbalizes understanding of plan ?-Patient/Caregiver will focus on medication adherence by taking medications as prescribed ?-Weigh daily and record (notify MD with 3 lb weight gain over night or 5 lb in a week) ?-Follow CHF Action Plan ?-Adhere to low sodium diet ?-Check your phone for call from [facility] ? ? ?Patient verbalizes understanding of instructions and care plan provided today and agrees to view in [place]. Active MyChart status confirmed with patient.   ? ?Follow up Plan: Patient would like continued follow-up.  CCM RNCM will outreach the patient within the next 5 weeks.  Patient will call office if needed prior to next encounter ? ?Lazaro Arms, RN ? ?[phone]  ?

## 2021-10-20 ENCOUNTER — Other Ambulatory Visit: Payer: Self-pay

## 2021-10-20 DIAGNOSIS — G20A1 Parkinson's disease without dyskinesia, without mention of fluctuations: Secondary | ICD-10-CM

## 2021-10-20 DIAGNOSIS — G20 Parkinson's disease: Secondary | ICD-10-CM

## 2021-10-20 MED ORDER — CARBIDOPA-LEVODOPA ER 50-200 MG PO TBCR: 1.0000 | EXTENDED_RELEASE_TABLET | Freq: Every day | ORAL | 1 refills | Status: DC

## 2021-10-20 MED ORDER — MIRTAZAPINE 15 MG PO TABS: 15.0000 mg | ORAL_TABLET | Freq: Every day | ORAL | 1 refills | Status: DC

## 2021-10-21 DIAGNOSIS — Z0279 Encounter for issue of other medical certificate: Secondary | ICD-10-CM

## 2021-10-22 ENCOUNTER — Other Ambulatory Visit: Payer: Self-pay

## 2021-10-25 ENCOUNTER — Telehealth: Payer: Self-pay | Admitting: Pharmacy Technician

## 2021-10-25 NOTE — Telephone Encounter (Signed)
Submitted a Prior Authorization request to [facility] for  Carbidopa-Levodopa ER 50-'200MG'$   via CoverMyMeds.  ? ? ?Key: [id number] - PA Case ID: [id number] ? ?Received response: There is an existing case within the [facility] environment that has the same patient, prescriber, and drug. This case must be finalized before proceeding with similar requests. ? ?Will reach out to plan to obtain pending PA questions; ? ?Phone# [phone] ? ? ? ?

## 2021-10-25 NOTE — Telephone Encounter (Signed)
Called [employer] to complete pending PA req. ? ?Case# [id number] ?Phone# [phone] ?

## 2021-10-27 ENCOUNTER — Ambulatory Visit: Payer: Medicare Other

## 2021-10-27 DIAGNOSIS — N3000 Acute cystitis without hematuria: Secondary | ICD-10-CM | POA: Diagnosis not present

## 2021-10-27 DIAGNOSIS — N952 Postmenopausal atrophic vaginitis: Secondary | ICD-10-CM | POA: Diagnosis not present

## 2021-10-27 DIAGNOSIS — R300 Dysuria: Secondary | ICD-10-CM | POA: Diagnosis not present

## 2021-10-28 DIAGNOSIS — I10 Essential (primary) hypertension: Secondary | ICD-10-CM | POA: Diagnosis not present

## 2021-10-28 DIAGNOSIS — J45909 Unspecified asthma, uncomplicated: Secondary | ICD-10-CM | POA: Diagnosis not present

## 2021-10-28 DIAGNOSIS — N183 Chronic kidney disease, stage 3 unspecified: Secondary | ICD-10-CM | POA: Diagnosis not present

## 2021-10-28 DIAGNOSIS — E118 Type 2 diabetes mellitus with unspecified complications: Secondary | ICD-10-CM | POA: Diagnosis not present

## 2021-10-29 ENCOUNTER — Other Ambulatory Visit (HOSPITAL_COMMUNITY): Payer: Self-pay

## 2021-11-01 ENCOUNTER — Ambulatory Visit (INDEPENDENT_AMBULATORY_CARE_PROVIDER_SITE_OTHER): Payer: Medicare Other

## 2021-11-01 DIAGNOSIS — Z95810 Presence of automatic (implantable) cardiac defibrillator: Secondary | ICD-10-CM

## 2021-11-01 DIAGNOSIS — I5022 Chronic systolic (congestive) heart failure: Secondary | ICD-10-CM | POA: Diagnosis not present

## 2021-11-01 NOTE — Telephone Encounter (Signed)
Called [employer] to follow up on PA request. Request was denied and appeal was initiated on 10/29/21. No determination has been made yet. ? ?Phone# [phone] ?

## 2021-11-01 NOTE — Progress Notes (Signed)
EPIC Encounter for ICM Monitoring ? ?Patient Name: Veronica Shaw is a 71 y.o. female ?Date: 11/01/2021 ?Primary Care Physican: Lyndee Hensen, DO ?Primary Cardiologist: Cooper/Weaver PA ?Electrophysiologist: Lovena Le ?08/20/2021 Office Weight: 129 lbs    ?11/02/2021 Weight: 121 lbs at home ?                                  ?  ?Spoke with patient and heart failure questions reviewed.  Pt reports feeling general weakness (possibly from Parkinson's), SOB on exertion (uses nebulizer) and loss of appetite.  Neurologist referred to outpatient rehab.   ?  ?OptiVol Thoracic impedance suggesting possible fluid accumulation since 3/20. ?  ?Prescribed: Furosemide 20 mg Take 1 tablet (20 mg total) by mouth daily.  Confirmed 4/4 she is taking 20 mg daily. ?  ?Labs: ?08/20/2021 Creatinine 1.11, BUN 21, Potassium 4.3, Sodium 140, GFR 53 ?04/20/2021 Creatinine 1.24, BUN 13, Potassium 4.6, Sodium 140, GFR 46 ?01/22/2021 Creatinine 1.50, BUN 29, Potassium 4.6, Sodium 144 ?01/18/2021 Creatinine 1.39, BUN 23, Potassium 4.6, Sodium 137, GFR 41  ?A complete set of results can be found in Results Review. ?  ?Recommendations:  Copy sent to Richardson Dopp, PA for review and recommendations if needed.  ?  ?Follow-up plan: ICM clinic phone appointment on 11/08/2021 to recheck fluid levels.   91 day device clinic remote transmission 11/25/2021.   ?  ?EP/Cardiology Office Visits:   Recalls 01/17/2022 for [name].  12/22/2021 with Dr Burt Knack. ? ?Copy of ICM check sent to Dr. Lovena Le.  ? ?3 month ICM trend: 11/01/2021. ? ? ? ?12-14 Month ICM trend:  ? ? ? ?[name], RN ?11/01/2021 ?11:52 AM ? ?

## 2021-11-02 ENCOUNTER — Ambulatory Visit: Payer: Medicare Other | Admitting: Physical Therapy

## 2021-11-02 NOTE — Progress Notes (Signed)
Take 1 extra dose of Furosemide 20 mg and recheck OptiVol as planned next week. ?Richardson Dopp, PA-C    ?11/02/2021 12:58 PM   ?

## 2021-11-02 NOTE — Progress Notes (Signed)
Attempted call to patient and unable to reach.  Left message to return call.  

## 2021-11-02 NOTE — Progress Notes (Signed)
Spoke with patient and advised Richardson Dopp, PA recommended to take 1 extra Furosemide tablet x 1 day only and then return to prescribed dosage of 1 tablet daily. She repeated instructions back correctly.   ?

## 2021-11-03 ENCOUNTER — Ambulatory Visit: Payer: Medicare Other | Attending: Neurology | Admitting: Physical Therapy

## 2021-11-03 ENCOUNTER — Encounter: Payer: Self-pay | Admitting: Physical Therapy

## 2021-11-03 DIAGNOSIS — R42 Dizziness and giddiness: Secondary | ICD-10-CM

## 2021-11-03 DIAGNOSIS — G20 Parkinson's disease: Secondary | ICD-10-CM | POA: Insufficient documentation

## 2021-11-03 DIAGNOSIS — R29818 Other symptoms and signs involving the nervous system: Secondary | ICD-10-CM

## 2021-11-03 DIAGNOSIS — R2681 Unsteadiness on feet: Secondary | ICD-10-CM

## 2021-11-03 DIAGNOSIS — M6281 Muscle weakness (generalized): Secondary | ICD-10-CM

## 2021-11-03 DIAGNOSIS — R2689 Other abnormalities of gait and mobility: Secondary | ICD-10-CM

## 2021-11-03 DIAGNOSIS — Z9181 History of falling: Secondary | ICD-10-CM

## 2021-11-03 NOTE — Therapy (Signed)
?OUTPATIENT PHYSICAL THERAPY NEURO EVALUATION ? ? ?Patient Name: Veronica Shaw ?MRN: [id number] ?DOB:July 20, 1951, 71 y.o., female ?Today's Date: 11/03/2021 ? ?PCP: Lyndee Hensen, DO ?REFERRING PROVIDER: Ludwig Clarks, DO ? ? PT End of Session - 11/03/21 1018   ? ? Visit Number 1   ? Number of Visits 17   ? Date for PT Re-Evaluation 02/01/22   ? Authorization Type Medicare   ? Progress Note Due on Visit 10   ? PT Start Time 1062   ? PT Stop Time 1015   ? PT Time Calculation (min) 43 min   ? Equipment Utilized During Treatment Gait belt   ? Activity Tolerance Patient tolerated treatment well;Patient limited by fatigue   ? Behavior During Therapy [facility] for tasks assessed/performed   ? ?  ?  ? ?  ? ? ?Past Medical History:  ?Diagnosis Date  ? Acute on chronic systolic CHF (congestive heart failure), NYHA class 4 ([place]) 10/19/2020  ? Asthma   ? Back pain 09/18/2019  ? Cervical disc disorder with radiculopathy of cervical region 09/24/2018  ? Chronic systolic CHF (congestive heart failure) ([place])   ? a. cMRI 4/15: EF 34% and findings - c/w NICM, normal RV size and function (RVEF 61%), Mild MR // b. Echo 2/15:  EF 30-35%, diff HK, ant-sept AK, Gr 2 DD, mild MR, trivial TR  //  c. Echo 5/17: EF 20-25%, severe diffuse HK, marked systolic dyssynchrony, grade 1 diastolic dysfunction, mild MR  //  d. RHC 5/17: Fick CO 2.9, RVSP 19, PASP 15, PW mean 2, low filing pressures and preserved CO   ? Cognitive impairment 10/19/2017  ? MOCA was administered with a score of 23/30  ? Diabetes mellitus   ? Diabetic peripheral neuropathy ([place]) 12/09/2011  ? Dyspnea 09/19/2012  ? Flu 10/17/2017  ? Gastritis   ? History of echocardiogram   ? Echo 6/18: EF 30-35, diffuse HK, grade 1 diastolic dysfunction, trivial MR, mild LAE, mild TR, no pericardial effusion  ? History of nuclear stress test   ? Myoview 5/18: EF 49, no ischemia, inferoseptal defect c/w LBBB artifact (intermediate risk due to EF < 50).  ? HTN (hypertension)   ? Hyperlipidemia   ?  Hyperlipidemia associated with type 2 diabetes mellitus ([place]) 03/05/2019  ? Hypertension associated with diabetes ([place]) 09/28/2006  ? Qualifier: Diagnosis of  By: [name]    ? Major depression 03/31/2013  ? Melena 08/21/2020  ? NICM (nonischemic cardiomyopathy) ([place])   ? a. Nuclear 5/13: Normal stress nuclear study. LV Ejection Fraction: 58%  //  b. LHC 10/14: Minor luminal irregularity in prox LAD, EF 35%   ? Parkinson disease ([place])   ? Plantar fasciitis   ? Plantar fasciitis, bilateral 06/15/2012  ? Rosacea 05/29/2009  ? Qualifier: Diagnosis of  By: [name] MD, Rodman Key    ? Sensorineural hearing loss (SNHL), bilateral 01/04/2017  ? Sleep apnea   ? was retested and no longer had it and so d/c CPAP  ? Syncope   ? Thoracic or lumbosacral neuritis or radiculitis, unspecified 07/03/2013  ? Tinnitus, bilateral 01/04/2017  ? Type II diabetes mellitus with complication ([place]) 6/94/8546  ? Diabetic eye exam done by [facility]: Dr. Prudencio Burly on 11/08/13: no diabetic retinopathy. Repeat in 1 year    ? Urticaria   ? ?Past Surgical History:  ?Procedure Laterality Date  ? BREAST EXCISIONAL BIOPSY Left 1970  ? benign cyst  ? BREAST SURGERY Left   ? BUNIONECTOMY    ?  CARDIAC CATHETERIZATION N/A 12/16/2015  ? Procedure: Right Heart Cath;  Surgeon: Sherren Mocha, MD;  Location: [facility];  Service: Cardiovascular;  Laterality: N/A;  ? CHOLECYSTECTOMY    ? eye lid surgery Bilateral 05/09/2019  ? IMPLANTABLE CARDIOVERTER DEFIBRILLATOR IMPLANT  11-25-13  ? MDT dual chamber ICD implanted by [name] for primary prevention  ? IMPLANTABLE CARDIOVERTER DEFIBRILLATOR IMPLANT N/A 11/25/2013  ? Procedure: IMPLANTABLE CARDIOVERTER DEFIBRILLATOR IMPLANT;  Surgeon: [name], MD;  Location: [facility] CATH LAB;  Service: Cardiovascular;  Laterality: N/A;  ? LEFT AND RIGHT HEART CATHETERIZATION WITH CORONARY ANGIOGRAM N/A 05/31/2013  ? Procedure: LEFT AND RIGHT HEART CATHETERIZATION WITH CORONARY ANGIOGRAM;  Surgeon: [name],  MD;  Location: [facility] CATH LAB;  Service: Cardiovascular;  Laterality: N/A;  ? TONSILLECTOMY    ? TUBAL LIGATION    ? ?Patient Active Problem List  ? Diagnosis Date Noted  ? Type 2 diabetes mellitus with diabetic polyneuropathy, without long-term current use of insulin ([place]) 09/06/2021  ? Type 2 diabetes mellitus with stage 3a chronic kidney disease, with long-term current use of insulin ([place]) 09/06/2021  ? Dyslipidemia 09/06/2021  ? 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 Orthostatic hypotension 07/27/2021  ? Mood changes 07/27/2021  ? Weight loss 05/07/2021  ? UTI (urinary tract infection) 01/24/2021  ? Acute on chronic systolic CHF (congestive heart failure), NYHA class 4 ([place]) 10/19/2020  ? Chronic HFrEF (heart failure with reduced ejection fraction) ([place])   ? Cervical dystonia 10/07/2020  ? Advanced care planning/counseling discussion 08/21/2020  ? Dementia without behavioral disturbance ([place]) 08/21/2020  ? Vision impairment, Right Eye 08/21/2020  ? Chronic kidney disease, stage 3a ([place]) 08/18/2020  ? Frequent urination 07/30/2020  ? Body mass index (BMI) 26.0-26.9, adult 01/29/2020  ? Chronic rhinitis 12/18/2019  ? Chronic respiratory failure with hypoxia ([place]) 12/18/2019  ? Hyperlipidemia associated with type 2 diabetes mellitus ([place]) 03/05/2019  ? Cervical radiculopathy 10/24/2018  ? Falls frequently   ? Asthma 08/03/2017  ? Dysuria 08/19/2015  ? Osteopenia 07/10/2015  ? Mild persistent asthma in adult without complication 59/56/3875  ? NICM (nonischemic cardiomyopathy) ([place]) 04/23/2014  ? Arthritis or polyarthritis, rheumatoid ([place]) 03/12/2014  ? ICD (implantable cardioverter-defibrillator), dual, in situ 03/06/2014  ? Heart Failure with Recovered Ejection Fraction, G2DD 05/31/2013  ? Parkinson disease ([place]) 05/21/2013  ? Vertigo 03/03/2013  ? Gastroesophageal reflux disease without esophagitis 09/27/2012  ? Trigger point with neck pain 04/12/2012  ? 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   ? Diabetic peripheral neuropathy ([place]) 12/09/2011  ? Chronic urticaria 05/27/2011  ? Sinus tachycardia ([place]) 05/27/2011  ? 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 05/20/2011  ? Type II diabetes mellitus with complication ([place]) 64/33/2951  ? Hypertension associated with diabetes ([place]) 09/28/2006  ? ? ?ONSET DATE: 10/19/2021 (date of referral) ? ?REFERRING DIAG: G20 (ICD-10-CM) - Parkinson's disease ([place])  ? ?THERAPY DIAG:  ?Unsteadiness on feet ? ?History of falling ? ?Other symptoms and signs involving the nervous system ? ?Other abnormalities of gait and mobility ? ?Muscle weakness (generalized) ? ?Dizziness and giddiness ? ?SUBJECTIVE:  ?                                                                                                                                                                                           ? ?  SUBJECTIVE STATEMENT: ?Pt reports she is not moving well. Is always aching from arthritis and stiff. Uses her rollator most of the time. Sometimes will use a cane. Always having falls - had one fall in the tub and grabbed onto the shower curtain and husband had to pick her up. Another fall happened when she closed her eyes and lost her balance. Reports her equilibirum feels off. Reports doctors don't want her to take meclizine for long term. Pt only takes it when she feels like she is too dizzy. Has not seen an ENT. Feels like she lost some muscle mass on her legs. Only doing some leg kicks when she is sitting in her recliner. Wears sunglasses sometimes for light sensitivity. Family helps with cooking and chores.  ? ?Pt accompanied by: self ? ?PERTINENT HISTORY: PMH: PD, chronic systolic CHF, diabetes, HTN, HLD, mild persistent asthma, cervical dystonia, chronic kidney disease stage III, peripheral neuropathy.  ? ? ?PAIN:  ?Are you having pain? Yes: NPRS scale: 7/10 ?Pain location: Knees, hips, shoulders, hands ?Pain description: Arthritic pain ?Aggravating factors: Walking, getting up after sitting for a  while ?Relieving factors: Nothing ? ?PRECAUTIONS: Fall, no driving, defibrillator  ? ?FALLS: Has patient fallen in last 6 months? Yes. Number of falls at least 1 a week, and sometimes family has to grab her to p

## 2021-11-04 ENCOUNTER — Telehealth: Payer: Self-pay | Admitting: Cardiovascular Disease

## 2021-11-04 ENCOUNTER — Ambulatory Visit: Payer: Medicare Other

## 2021-11-04 DIAGNOSIS — Z79899 Other long term (current) drug therapy: Secondary | ICD-10-CM

## 2021-11-04 MED ORDER — POTASSIUM CHLORIDE ER 10 MEQ PO TBCR: 10.0000 meq | EXTENDED_RELEASE_TABLET | Freq: Every day | ORAL | 0 refills | Status: DC

## 2021-11-04 NOTE — Telephone Encounter (Signed)
Called and spoke directly to patient to relay message of Burt Knack. Potassium sent to pharmacy on file and pt scheduled lab appt for BMET on 11/18/21. ?

## 2021-11-04 NOTE — Chronic Care Management (AMB) (Signed)
? ?  RN Case Manager ?Care Management  ? Phone Outreach  ? ? ?11/04/2021 ?Name: [name] MRN: [id number] DOB: July 01, 1951 ? ? ?Veronica Shaw is a 71 y.o. year old female who is a primary care patient of Lyndee Hensen, DO .  ? ?I received a call this morning from the patient stating that she had severe leg cramps today. She said she had been trying to call [facility] to come in but could not get through. I advised her to call the cardiology and tell them about the issues that she is having. She said cardiology instructed her to take an extra fluid pill because she had some fluid built up. She verbalized understanding and will call them. ? ? ?Follow Up Plan: CM RN will follow up with the patient at he next scheduled Interval.  ? ?Review of patient status, including review of consultants reports, relevant laboratory and other test results, and collaboration with appropriate care team members and the patient's provider was performed as part of comprehensive patient evaluation and provision of care management services.   ? ?Lazaro Arms RN, BSN, [facility] ?Care Management Coordinator ?[place] ?Phone: [phone] Fax: [phone] ?  ? ? ? ? ? ? ? ? ? ? ? ?

## 2021-11-04 NOTE — Telephone Encounter (Signed)
OK to prescribe KDur 10 meq daily, but need to keep a close eye on her potassium since she is taking spiro. Should have a BMET in 1-2 weeks. thx ?

## 2021-11-04 NOTE — Telephone Encounter (Signed)
Patient called saying she had leg cramps through out the night, she had 9 last night.  She called her PCP, which advised her to call us.  That we should prescribe to her potassium, that would help her leg cramps.  ?

## 2021-11-04 NOTE — Telephone Encounter (Signed)
Pt states that she began having leg cramps last night, worst she's had. States she got up approximately 9 times during the night last night because they were so bad. She states that this has happened to her before when she was dehydrated, approximately 1 year ago. Pt was advised to take an extra dose one time only by Richardson Dopp on 4/3 (Monday), which patient did on Tuesday (4/4). She confirms that she resumed her regular dosing on Wednesday (4/5), but attributes this to her cramping. She states that she used to take a potassium supplement up until 1 year ago (10 mEq daily) when it was stopped, but she cannot remember by whom. Last BMET on 08/20/21 shows K=4.3. Pt requesting potassium refill to help with her leg cramping and states that PCP advised that it needed to come from cardiology. Will route to Dr Burt Knack for review. ? ?

## 2021-11-08 ENCOUNTER — Ambulatory Visit: Payer: Medicare Other

## 2021-11-08 DIAGNOSIS — Z95810 Presence of automatic (implantable) cardiac defibrillator: Secondary | ICD-10-CM

## 2021-11-08 DIAGNOSIS — I5022 Chronic systolic (congestive) heart failure: Secondary | ICD-10-CM

## 2021-11-09 NOTE — Progress Notes (Signed)
EPIC Encounter for ICM Monitoring ? ?Patient Name: Veronica Shaw is a 71 y.o. female ?Date: 11/09/2021 ?Primary Care Physican: Veronica Hensen, DO ?Primary Cardiologist: Cooper/Weaver PA ?Electrophysiologist: Veronica Shaw ?08/20/2021 Office Weight: 129 lbs    ?11/02/2021 Weight: 121 lbs at home ?                                  ?  ?Spoke with patient and heart failure questions reviewed.  Pt reports feeling better and SOB, loss of appetite has resolved. Does not have the weak feeling.   ?  ?OptiVol Thoracic impedance suggesting fluid levels returned to normal after taking extra Furosemide.  ?  ?Prescribed: Furosemide 20 mg Take 1 tablet (20 mg total) by mouth daily.  Confirmed 4/4 she is taking 20 mg daily. ?  ?Labs: ?08/20/2021 Creatinine 1.11, BUN 21, Potassium 4.3, Sodium 140, GFR 53 ?04/20/2021 Creatinine 1.24, BUN 13, Potassium 4.6, Sodium 140, GFR 46 ?01/22/2021 Creatinine 1.50, BUN 29, Potassium 4.6, Sodium 144 ?01/18/2021 Creatinine 1.39, BUN 23, Potassium 4.6, Sodium 137, GFR 41  ?A complete set of results can be found in Results Review. ?  ?Recommendations:  No changes and encouraged to call if experiencing any fluid symptoms.  ?  ?Follow-up plan: ICM clinic phone appointment on 12/06/2021.   91 day device clinic remote transmission 11/25/2021.   ?  ?EP/Cardiology Office Visits:   Recalls 01/17/2022 for Dr Veronica Shaw.  12/22/2021 with Dr Veronica Shaw. ?  ?Copy of ICM check sent to Dr. Lovena Shaw.  ? ?3 month ICM trend: 11/08/2021. ? ? ? ?12-14 Month ICM trend:  ? ? ? ?Veronica Billings, RN ?11/09/2021 ?3:50 PM ? ?

## 2021-11-10 ENCOUNTER — Ambulatory Visit: Payer: Medicare Other | Admitting: Family Medicine

## 2021-11-11 ENCOUNTER — Other Ambulatory Visit (HOSPITAL_COMMUNITY): Payer: Self-pay

## 2021-11-11 NOTE — Telephone Encounter (Signed)
Received fax from [facility] with notice of denied appeal for Carbidopa/ Levodopa ER 50/'200mg'$ : Full denial letter scanned into patient's chart. ? ? ?

## 2021-11-15 ENCOUNTER — Ambulatory Visit (INDEPENDENT_AMBULATORY_CARE_PROVIDER_SITE_OTHER): Payer: Medicare Other | Admitting: Family Medicine

## 2021-11-15 ENCOUNTER — Encounter: Payer: Self-pay | Admitting: Family Medicine

## 2021-11-15 VITALS — BP 115/58 | HR 87 | Ht 61.0 in | Wt 122.0 lb

## 2021-11-15 DIAGNOSIS — E785 Hyperlipidemia, unspecified: Secondary | ICD-10-CM | POA: Diagnosis not present

## 2021-11-15 DIAGNOSIS — E118 Type 2 diabetes mellitus with unspecified complications: Secondary | ICD-10-CM

## 2021-11-15 DIAGNOSIS — E1169 Type 2 diabetes mellitus with other specified complication: Secondary | ICD-10-CM

## 2021-11-15 DIAGNOSIS — Z20822 Contact with and (suspected) exposure to covid-19: Secondary | ICD-10-CM | POA: Diagnosis not present

## 2021-11-15 NOTE — Patient Instructions (Addendum)
It was great seeing you today! ? ? ?Visit Remembers: ?- Stop by the pharmacy to pick up your prescriptions  ?- Continue to work on your healthy eating habits and incorporating exercise into your daily life.  ?- Medicine Changes: limit your use of Gabapentin and Tizanidine as these medications can increase your fall risk  ?- Continue going to your physical therapy appointments.  ?- If you fall and it takes you a while to get up or you feel confused please go to the emergency department.  ? ? ?Regarding lab work today:  ?Due to recent changes in healthcare laws, you may see the results of your imaging and laboratory studies on MyChart before your provider has had a chance to review them.  I understand that in some cases there may be results that are confusing or concerning to you. Not all laboratory results come back in the same time frame and you may be waiting for multiple results in order to interpret others.  Please give [place] 72 hours in order for your provider to thoroughly review all the results before contacting the office for clarification of your results. If everything is normal, you will get a letter in the mail or a message in My Chart. Please give [place] a call if you do not hear from [place] after 2 weeks. ? ?Please bring all of your medications with you to each visit.  ?  ? ?Feel free to call with any questions or concerns at any time, at [phone]. ?  ?Take care,  ?Dr. Susa Simmonds ?[place]  ?

## 2021-11-15 NOTE — Progress Notes (Addendum)
? ?  SUBJECTIVE:  ? ?CHIEF COMPLAINT / HPI:  ? ? ? ?Veronica Shaw is a 71 y.o. female here for:  ?  ?Falls ?Pt reports "a few falls" a week. She occasionally hits her head on the sides of furniture during her fall. Denies confusion. No new extremity weakness. Has parkinson's. Her 34yo husband is unable to assist her up.  She has right knee pain. Rated 8/10. Recently had her initial vitis with physical therapy. They want her to be seen three times a week.   Notes that her daughter helps her tremendously. Requests FMLA for her daughter so can attend her appointments.  ? ? ?Dysuria symptoms have resolved with urethral cream.  ? ?PERTINENT  PMH / PSH: reviewed and updated as appropriate  ? ?OBJECTIVE:  ? ?Ht '5\' 1"'$  (1.549 m)   Wt 122 lb (55.3 kg)   LMP 08/01/2000 (Approximate)   BMI 23.05 kg/m?   ? ?GEN: pleasant  female, in no acute distress  ?CV: regular rate ?RESP: no increased work of breathing ?MSK: rolling walker in the exam room, mild left edema (baseline per pt), good ROM, right knee: medial right knee joint line tenderness, limited flexion with good extension, no edema ?SKIN: warm, dry ?NEURO: alert and oriented at baseline, fluid speech, no resting tremor observed, moves all extremities appropriately ? ? ? ?ASSESSMENT/PLAN:  ? ?Type II diabetes mellitus with complication (HCC) ?Continue metformin.  A1c today 6.5 and previously at goal. Encouraged continued diet rich in vegetables and complex carbs. ? ?Statin therapy: Crestor, lipid panel today  ?ACEi/ARB: Losartan ?Eye exam:  Dr Royal Piedra : ophthalmology  ? ?Frequent Falls  ?Hx of Parkinson's disease. Following with physical therapy. FMLA paperwork completed for pt's daughter. Copy to be placed in chart.  Uses a knee brace but is slides down. Asked pt to get smaller size as she has lost ~10 lbs in the last 4 months. Discontinue meclizine.  She is taking Zanaflex and Gabapentin which could be contributing. Advised to limit these medications. States she  takes them as needed at bedtime and doesn't get up at night. May benefit from [facility] emergency one-click device. ED precautions provided.  ? ? ?Lyndee Hensen, DO ?PGY-3, [facility] ?11/15/2021  ? ? ? ? ? ? ? ? ?

## 2021-11-16 LAB — LIPID PANEL
Chol/HDL Ratio: 3.2 ratio (ref 0.0–4.4)
Cholesterol, Total: 171 mg/dL (ref 100–199)
HDL: 53 mg/dL (ref >39)
LDL Chol Calc (NIH): 97 mg/dL (ref 0–99)
Triglycerides: 120 mg/dL (ref 0–149)
VLDL Cholesterol Cal: 21 mg/dL (ref 5–40)

## 2021-11-16 LAB — HEMOGLOBIN A1C
Est. average glucose Bld gHb Est-mCnc: 140 mg/dL
Hgb A1c MFr Bld: 6.5 % — ABNORMAL HIGH (ref 4.8–5.6)

## 2021-11-17 ENCOUNTER — Ambulatory Visit: Payer: Medicare Other

## 2021-11-17 NOTE — Chronic Care Management (AMB) (Signed)
?Chronic Care Management  ? ?CCM RN Visit Note ? ?11/17/2021 ?Name: Veronica Shaw MRN: [id number] DOB: Dec 20, 1950 ? ?Subjective: ?Veronica Shaw is a 71 y.o. year old female who is a primary care patient of Lyndee Hensen, DO. The care management team was consulted for assistance with disease management and care coordination needs.   ? ?Engaged with patient by telephone for follow up visit in response to provider referral for case management and/or care coordination services.  ? ?Consent to Services:  ?The patient was given information about Chronic Care Management services, agreed to services, and gave verbal consent prior to initiation of services.  Please see initial visit note for detailed documentation.  ? ?Patient agreed to services and verbal consent obtained.  ? ? ?Summary: Patient is making progress with her CHF  , but currently is experiencing difficulty with her knees and having falls. . See Care Plan below for interventions and patient self-care actives. ? ?Recommendation: The patient may benefit from taking medications as prescribed, weighing Daily and record values, and make an appointment with Ortho about to have a follow up about her knees. ? ?Follow up Plan: Patient would like continued follow-up.  CCM RNCM will outreach the patient within the next 5 weeks.  Patient will call office if needed prior to next encounter ? ? ?Assessment: Review of patient past medical history, allergies, medications, health status, including review of consultants reports, laboratory and other test data, was performed as part of comprehensive evaluation and provision of chronic care management services.  ? ?SDOH (Social Determinants of Health) assessments and interventions performed:   ? ?CCM Care Plan ? ?Conditions to be addressed/monitored:CHF ? ?Care Plan : RN Case Manager  ?Updates made by Lazaro Arms, RN since 11/17/2021 12:00 AM  ?  ? ?Problem: Symptom Exacerbation (Heart Failure)   ?  ? ?Long-Range Goal:  Patient to manage and monitor signs and symptoms of Heart Failure   ?Start Date: 11/03/2020  ?Expected End Date: 08/31/2021  ?Priority: High  ?Note:   ?Current Barriers:  ?Knowledge deficit related to basic heart failure pathophysiology and self care management ? ?RNCM Clinical Goal(s):  ?Patient will verbalize understanding of plan for management of CHF as evidenced by by 90 days of no admissions to the hospital  through collaboration with RN Care manager, provider, and care team.  ? ?Interventions: ?1:1 collaboration with primary care provider regarding development and update of comprehensive plan of care as evidenced by provider attestation and co-signature ?Inter-disciplinary care team collaboration (see longitudinal plan of care) ?Evaluation of current treatment plan related to  self management and patient's adherence to plan as established by provider ? ? ?Heart Failure Interventions:  (Status:  Condition stable.  Not addressed this visit.) Long Term Goal ? ?Interventions: Goal on Track ?Basic overview and discussion of  Heart Failure reviewed ?Reviewed Heart Failure Action Plan  ?Discussed importance of daily weight and advised patient to weigh and record daily- ?11/17/21: The patient said she is doing fair. She denies any chest pain, shortness of breath, or swelling. She is taking her medications, eating, and sleeping ok. She did have two falls this week with no injury due to her knee buckling.   She has a brace, which is too big since she lost weight. She has been advised to return to Ortho and get refitted for a new one to help her. She stated that she was going to call and get an appointment. ? ?Patient Goals/Self Care Activities: ?-Patient/Caregiver will self-administer medications as  prescribed as evidenced by self-report/primary caregiver report  ?-Patient/Caregiver will attend all scheduled provider appointments as evidenced by clinician review of documented attendance to scheduled appointments and  patient/caregiver report ?-Patient/Caregiver will call pharmacy for medication refills as evidenced by patient report and review of pharmacy fill history as appropriate ?-Patient/Caregiver will call provider office for new concerns or questions as evidenced by review of documented incoming telephone call notes and patient report ?-Patient/Caregiver verbalizes understanding of plan ?-Patient/Caregiver will focus on medication adherence by taking medications as prescribed ?-Weigh daily and record (notify MD with 3 lb weight gain over night or 5 lb in a week) ?-Follow CHF Action Plan ?-Adhere to low sodium diet ? ?  ? ?Lazaro Arms RN, BSN, [place] ?Care Management Coordinator ?[place]  ?Phone: [phone]  ?  ? ? ? ? ? ? ?

## 2021-11-17 NOTE — Patient Instructions (Signed)
Visit Information ? ?[name]  it was nice speaking with you. Please call me directly [phone] if you have questions about the goals we discussed. ? ?Patient Goals/Self Care Activities: ?-Patient/Caregiver will self-administer medications as prescribed as evidenced by self-report/primary caregiver report  ?-Patient/Caregiver will attend all scheduled provider appointments as evidenced by clinician review of documented attendance to scheduled appointments and patient/caregiver report ?-Patient/Caregiver will call pharmacy for medication refills as evidenced by patient report and review of pharmacy fill history as appropriate ?-Patient/Caregiver will call provider office for new concerns or questions as evidenced by review of documented incoming telephone call notes and patient report ?-Patient/Caregiver verbalizes understanding of plan ?-Patient/Caregiver will focus on medication adherence by taking medications as prescribed ?-Weigh daily and record (notify MD with 3 lb weight gain over night or 5 lb in a week) ?-Follow CHF Action Plan ?-Adhere to low sodium diet ? ? ?Patient verbalizes understanding of instructions and care plan provided today and agrees to view in [place]. Active MyChart status confirmed with patient.   ? ?Follow up Plan: Patient would like continued follow-up.  CCM RNCM will outreach the patient within the next 5 weeks.  Patient will call office if needed prior to next encounter ? ?Lazaro Arms, RN ? ?[phone]  ?

## 2021-11-18 ENCOUNTER — Other Ambulatory Visit: Payer: Medicare Other

## 2021-11-18 DIAGNOSIS — Z79899 Other long term (current) drug therapy: Secondary | ICD-10-CM | POA: Diagnosis not present

## 2021-11-18 LAB — BASIC METABOLIC PANEL
BUN/Creatinine Ratio: 18 (ref 12–28)
BUN: 22 mg/dL (ref 8–27)
CO2: 25 mmol/L (ref 20–29)
Calcium: 10.5 mg/dL — ABNORMAL HIGH (ref 8.7–10.3)
Chloride: 103 mmol/L (ref 96–106)
Creatinine, Ser: 1.24 mg/dL — ABNORMAL HIGH (ref 0.57–1.00)
Glucose: 91 mg/dL (ref 70–99)
Potassium: 4.8 mmol/L (ref 3.5–5.2)
Sodium: 140 mmol/L (ref 134–144)
eGFR: 46 mL/min/{1.73_m2} — ABNORMAL LOW (ref >59)

## 2021-11-18 NOTE — Assessment & Plan Note (Signed)
Continue metformin.  A1c today 6.5 and previously at goal. Encouraged continued diet rich in vegetables and complex carbs. ? ?Statin therapy: Crestor, lipid panel today  ?ACEi/ARB: Losartan ?Eye exam:  Dr Royal Piedra : ophthalmology  ? ?

## 2021-11-23 ENCOUNTER — Ambulatory Visit: Payer: Medicare Other | Admitting: Physical Therapy

## 2021-11-25 ENCOUNTER — Encounter: Payer: Self-pay | Admitting: Physical Therapy

## 2021-11-25 ENCOUNTER — Ambulatory Visit (INDEPENDENT_AMBULATORY_CARE_PROVIDER_SITE_OTHER): Payer: Medicare Other

## 2021-11-25 ENCOUNTER — Ambulatory Visit: Payer: Medicare Other | Admitting: Physical Therapy

## 2021-11-25 DIAGNOSIS — R29818 Other symptoms and signs involving the nervous system: Secondary | ICD-10-CM | POA: Diagnosis not present

## 2021-11-25 DIAGNOSIS — I5022 Chronic systolic (congestive) heart failure: Secondary | ICD-10-CM

## 2021-11-25 DIAGNOSIS — Z9181 History of falling: Secondary | ICD-10-CM | POA: Diagnosis not present

## 2021-11-25 DIAGNOSIS — R42 Dizziness and giddiness: Secondary | ICD-10-CM | POA: Diagnosis not present

## 2021-11-25 DIAGNOSIS — R2689 Other abnormalities of gait and mobility: Secondary | ICD-10-CM

## 2021-11-25 DIAGNOSIS — R2681 Unsteadiness on feet: Secondary | ICD-10-CM

## 2021-11-25 DIAGNOSIS — M6281 Muscle weakness (generalized): Secondary | ICD-10-CM | POA: Diagnosis not present

## 2021-11-25 DIAGNOSIS — I428 Other cardiomyopathies: Secondary | ICD-10-CM

## 2021-11-25 LAB — CUP PACEART REMOTE DEVICE CHECK
Battery Remaining Longevity: 24 mo
Battery Voltage: 2.92 V
Brady Statistic AP VP Percent: 0 %
Brady Statistic AP VS Percent: 0 %
Brady Statistic AS VP Percent: 0.02 %
Brady Statistic AS VS Percent: 99.97 %
Brady Statistic RA Percent Paced: 0 %
Brady Statistic RV Percent Paced: 0.03 %
Date Time Interrogation Session: 20230427012503
HighPow Impedance: 70 Ohm
Implantable Lead Implant Date: 20150427
Implantable Lead Implant Date: 20150427
Implantable Lead Location: 753859
Implantable Lead Location: 753860
Implantable Lead Model: 5076
Implantable Lead Model: 6935
Implantable Pulse Generator Implant Date: 20150427
Lead Channel Impedance Value: 342 Ohm
Lead Channel Impedance Value: 4047 Ohm
Lead Channel Impedance Value: 4047 Ohm
Lead Channel Impedance Value: 4047 Ohm
Lead Channel Impedance Value: 456 Ohm
Lead Channel Impedance Value: 551 Ohm
Lead Channel Pacing Threshold Amplitude: 0.625 V
Lead Channel Pacing Threshold Amplitude: 0.75 V
Lead Channel Pacing Threshold Pulse Width: 0.4 ms
Lead Channel Pacing Threshold Pulse Width: 0.4 ms
Lead Channel Sensing Intrinsic Amplitude: 1.75 mV
Lead Channel Sensing Intrinsic Amplitude: 1.75 mV
Lead Channel Sensing Intrinsic Amplitude: 14.125 mV
Lead Channel Sensing Intrinsic Amplitude: 14.125 mV
Lead Channel Setting Pacing Amplitude: 2 V
Lead Channel Setting Pacing Amplitude: 2.5 V
Lead Channel Setting Pacing Pulse Width: 0.4 ms
Lead Channel Setting Sensing Sensitivity: 0.3 mV

## 2021-11-25 NOTE — Therapy (Signed)
?OUTPATIENT PHYSICAL THERAPY TREATMENT NOTE ? ? ?Patient Name: Veronica Shaw ?MRN: [id number] ?DOB:23-Jan-1951, 71 y.o., female ?Today's Date: 11/25/2021 ? ?PCP: Lyndee Hensen, DO ?REFERRING PROVIDER: Ludwig Clarks, DO ? ?END OF SESSION:  ? PT End of Session - 11/25/21 0935   ? ? Visit Number 2   ? Number of Visits 17   ? Date for PT Re-Evaluation 02/01/22   ? Authorization Type Medicare   ? Progress Note Due on Visit 10   ? PT Start Time 9629   ? PT Stop Time 1014   ? PT Time Calculation (min) 42 min   ? Equipment Utilized During Treatment Gait belt   ? Activity Tolerance Patient tolerated treatment well;Patient limited by fatigue   ? Behavior During Therapy [facility] for tasks assessed/performed   ? ?  ?  ? ?  ? ? ?Past Medical History:  ?Diagnosis Date  ? Acute on chronic systolic CHF (congestive heart failure), NYHA class 4 ([place]) 10/19/2020  ? Asthma   ? Back pain 09/18/2019  ? Cervical disc disorder with radiculopathy of cervical region 09/24/2018  ? Chronic systolic CHF (congestive heart failure) ([place])   ? a. cMRI 4/15: EF 34% and findings - c/w NICM, normal RV size and function (RVEF 61%), Mild MR // b. Echo 2/15:  EF 30-35%, diff HK, ant-sept AK, Gr 2 DD, mild MR, trivial TR  //  c. Echo 5/17: EF 20-25%, severe diffuse HK, marked systolic dyssynchrony, grade 1 diastolic dysfunction, mild MR  //  d. RHC 5/17: Fick CO 2.9, RVSP 19, PASP 15, PW mean 2, low filing pressures and preserved CO   ? Cognitive impairment 10/19/2017  ? MOCA was administered with a score of 23/30  ? Diabetes mellitus   ? Diabetic peripheral neuropathy ([place]) 12/09/2011  ? Dyspnea 09/19/2012  ? Flu 10/17/2017  ? Gastritis   ? History of echocardiogram   ? Echo 6/18: EF 30-35, diffuse HK, grade 1 diastolic dysfunction, trivial MR, mild LAE, mild TR, no pericardial effusion  ? History of nuclear stress test   ? Myoview 5/18: EF 49, no ischemia, inferoseptal defect c/w LBBB artifact (intermediate risk due to EF < 50).  ? HTN (hypertension)   ?  Hyperlipidemia   ? Hyperlipidemia associated with type 2 diabetes mellitus ([place]) 03/05/2019  ? Hypertension associated with diabetes ([place]) 09/28/2006  ? Qualifier: Diagnosis of  By: [name]    ? Major depression 03/31/2013  ? Melena 08/21/2020  ? NICM (nonischemic cardiomyopathy) ([place])   ? a. Nuclear 5/13: Normal stress nuclear study. LV Ejection Fraction: 58%  //  b. LHC 10/14: Minor luminal irregularity in prox LAD, EF 35%   ? Parkinson disease ([place])   ? Plantar fasciitis   ? Plantar fasciitis, bilateral 06/15/2012  ? Rosacea 05/29/2009  ? Qualifier: Diagnosis of  By: [name] MD, Rodman Key    ? Sensorineural hearing loss (SNHL), bilateral 01/04/2017  ? Sleep apnea   ? was retested and no longer had it and so d/c CPAP  ? Syncope   ? Thoracic or lumbosacral neuritis or radiculitis, unspecified 07/03/2013  ? Tinnitus, bilateral 01/04/2017  ? Type II diabetes mellitus with complication ([place]) 12/27/4130  ? Diabetic eye exam done by [facility]: Dr. Prudencio Burly on 11/08/13: no diabetic retinopathy. Repeat in 1 year    ? Urticaria   ? ?Past Surgical History:  ?Procedure Laterality Date  ? BREAST EXCISIONAL BIOPSY Left 1970  ? benign cyst  ? BREAST SURGERY Left   ?  BUNIONECTOMY    ? CARDIAC CATHETERIZATION N/A 12/16/2015  ? Procedure: Right Heart Cath;  Surgeon: Sherren Mocha, MD;  Location: [facility];  Service: Cardiovascular;  Laterality: N/A;  ? CHOLECYSTECTOMY    ? eye lid surgery Bilateral 05/09/2019  ? IMPLANTABLE CARDIOVERTER DEFIBRILLATOR IMPLANT  11-25-13  ? MDT dual chamber ICD implanted by [name] for primary prevention  ? IMPLANTABLE CARDIOVERTER DEFIBRILLATOR IMPLANT N/A 11/25/2013  ? Procedure: IMPLANTABLE CARDIOVERTER DEFIBRILLATOR IMPLANT;  Surgeon: [name], MD;  Location: [facility] CATH LAB;  Service: Cardiovascular;  Laterality: N/A;  ? LEFT AND RIGHT HEART CATHETERIZATION WITH CORONARY ANGIOGRAM N/A 05/31/2013  ? Procedure: LEFT AND RIGHT HEART CATHETERIZATION WITH CORONARY ANGIOGRAM;   Surgeon: [name], MD;  Location: [facility] CATH LAB;  Service: Cardiovascular;  Laterality: N/A;  ? TONSILLECTOMY    ? TUBAL LIGATION    ? ?Patient Active Problem List  ? Diagnosis Date Noted  ? Type 2 diabetes mellitus with diabetic polyneuropathy, without long-term current use of insulin ([place]) 09/06/2021  ? Type 2 diabetes mellitus with stage 3a chronic kidney disease, with long-term current use of insulin ([place]) 09/06/2021  ? Dyslipidemia 09/06/2021  ? 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 Orthostatic hypotension 07/27/2021  ? Mood changes 07/27/2021  ? Weight loss 05/07/2021  ? Acute on chronic systolic CHF (congestive heart failure), NYHA class 4 ([place]) 10/19/2020  ? Chronic HFrEF (heart failure with reduced ejection fraction) ([place])   ? Cervical dystonia 10/07/2020  ? Advanced care planning/counseling discussion 08/21/2020  ? Dementia without behavioral disturbance ([place]) 08/21/2020  ? Vision impairment, Right Eye 08/21/2020  ? Chronic kidney disease, stage 3a ([place]) 08/18/2020  ? Chronic rhinitis 12/18/2019  ? Chronic respiratory failure with hypoxia ([place]) 12/18/2019  ? Hyperlipidemia associated with type 2 diabetes mellitus ([place]) 03/05/2019  ? Cervical radiculopathy 10/24/2018  ? Falls frequently   ? Asthma 08/03/2017  ? Osteopenia 07/10/2015  ? Mild persistent asthma in adult without complication 09/38/1829  ? NICM (nonischemic cardiomyopathy) ([place]) 04/23/2014  ? Arthritis or polyarthritis, rheumatoid ([place]) 03/12/2014  ? ICD (implantable cardioverter-defibrillator), dual, in situ 03/06/2014  ? Heart Failure with Recovered Ejection Fraction, G2DD 05/31/2013  ? Parkinson disease ([place]) 05/21/2013  ? Vertigo 03/03/2013  ? Gastroesophageal reflux disease without esophagitis 09/27/2012  ? Trigger point with neck pain 04/12/2012  ? 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  ? Diabetic peripheral neuropathy ([place]) 12/09/2011  ? Chronic urticaria 05/27/2011  ? Sinus tachycardia ([place]) 05/27/2011  ?  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 05/20/2011  ? Type II diabetes mellitus with complication ([place]) 93/71/6967  ? Hypertension associated with diabetes ([place]) 09/28/2006  ? ? ?REFERRING DIAG: G20 (ICD-10-CM) - Parkinson's disease ([place])  ? ?THERAPY DIAG:  ?Unsteadiness on feet ? ?History of falling ? ?Other symptoms and signs involving the nervous system ? ?Other abnormalities of gait and mobility ? ?PERTINENT HISTORY: PMH: PD, chronic systolic CHF, diabetes, HTN, HLD, mild persistent asthma, cervical dystonia, chronic kidney disease stage III, peripheral neuropathy.  ?  ? ?PRECAUTIONS: Fall, no driving, defibrillator  ? ?SUBJECTIVE: Had some trips, but no falls. Has not been sleeping well so is a little tired. Reports the tremors are worse at night, has been taking medication for it, but has not noticed a difference.  ? ? ?  ? ?  ?TODAY'S TREATMENT:  ?GAIT: ?Gait pattern: step through pattern, decreased stance time- Right, decreased stride length, decreased ankle dorsiflexion- Right, shuffling, and poor foot clearance- Right ?Distance walked: clinic distances ?Assistive  device utilized: Environmental consultant - 4 wheeled rollator  ?Level of assistance: SBA ? ?Into and out of session - cued for longer stride length and heel strike.  ? ? ?Pt performs PWR! Moves in seated position (reviewed from prior HEP, but pt had not been performing) ?  ?PWR! Up for improved posture x10  reps - cues for R elbow extension, open hands, and scap retraction  ? ?PWR! Rock for improved weighshifting x10 reps, cues to reach across body  ? ?PWR! Twist for improved trunk rotation 2 sets of 5 reps each side, cues to use lower body to help with twist and to open big in the middle  ? ?PWR! Step for improved step initiation x10  reps, cues for incr step height esp with LLE.  ? ?Cues provided for technique and intensity. Educated on how it relates to function. Cues for opening hands and extending elbows throughout exercises. Educated to try to work effort level to up to a 7/10 -  pt reporting at a 5/10. Provided new handout for HEP.  ? ? ? Access Code: [email or web address] ?URL: [email or web address] ?Date: 11/25/2021 ?Prepared by: [name] ? ?New additions to HEP. See MedBridge f

## 2021-11-25 NOTE — Patient Instructions (Signed)
Access Code: [email or web address] ?URL: [email or web address] ?Date: 11/25/2021 ?Prepared by: [name] ? ?Exercises ?- Sit to Stand with Hands on Knees  - 2 x daily - 5 x weekly - 1 sets - 10 reps ?- Step Sideways with Arms Reaching  - 1 x daily - 5 x weekly - 1 sets - 10 reps ? ? ? ?

## 2021-11-26 ENCOUNTER — Telehealth: Payer: Self-pay | Admitting: Physical Therapy

## 2021-11-26 DIAGNOSIS — G20 Parkinson's disease: Secondary | ICD-10-CM

## 2021-11-26 NOTE — Telephone Encounter (Signed)
Dr. Carles Collet, ? ?Veronica Shaw was evaluated by Physical Therapy. The patient would benefit from an OT evaluation for impaired coordination, difficulties with ADLs, and bradykinesia due to PD.   ? ?If you agree, please place an order in [facility] workque in [facility] or fax the order to [phone]. ?Thank you, ?[name], PT, DPT ?11/26/21 9:44 AM  ? ? ?[place] [address] ?[address] ?[place], [place]  [address] ?Phone:  [phone] ?Fax:  [phone] ? ?

## 2021-11-30 ENCOUNTER — Ambulatory Visit: Payer: Medicare Other | Attending: Neurology | Admitting: Physical Therapy

## 2021-11-30 ENCOUNTER — Ambulatory Visit: Payer: Medicare Other | Admitting: Occupational Therapy

## 2021-11-30 DIAGNOSIS — M25621 Stiffness of right elbow, not elsewhere classified: Secondary | ICD-10-CM

## 2021-11-30 DIAGNOSIS — R2689 Other abnormalities of gait and mobility: Secondary | ICD-10-CM | POA: Diagnosis not present

## 2021-11-30 DIAGNOSIS — R278 Other lack of coordination: Secondary | ICD-10-CM

## 2021-11-30 DIAGNOSIS — R208 Other disturbances of skin sensation: Secondary | ICD-10-CM

## 2021-11-30 DIAGNOSIS — M25611 Stiffness of right shoulder, not elsewhere classified: Secondary | ICD-10-CM | POA: Diagnosis not present

## 2021-11-30 DIAGNOSIS — R29818 Other symptoms and signs involving the nervous system: Secondary | ICD-10-CM

## 2021-11-30 DIAGNOSIS — R2681 Unsteadiness on feet: Secondary | ICD-10-CM

## 2021-11-30 DIAGNOSIS — M25612 Stiffness of left shoulder, not elsewhere classified: Secondary | ICD-10-CM | POA: Insufficient documentation

## 2021-11-30 DIAGNOSIS — M25622 Stiffness of left elbow, not elsewhere classified: Secondary | ICD-10-CM | POA: Insufficient documentation

## 2021-11-30 DIAGNOSIS — Z9181 History of falling: Secondary | ICD-10-CM | POA: Diagnosis not present

## 2021-11-30 NOTE — Therapy (Signed)
?OUTPATIENT OCCUPATIONAL THERAPY [facility]INSON'S EVALUATION ? ?Patient Name: Veronica Shaw ?MRN: [id number] ?DOB:1950/09/26, 71 y.o., female ?Today's Date: 12/01/2021 ? ?PCP: Dr. Susa Simmonds ?REFERRING PROVIDER: Dr. Carles Collet ? ? OT End of Session - 12/01/21 0913   ? ? Visit Number 1   ? Number of Visits 25   ? Date for OT Re-Evaluation 02/23/22   ? Authorization Type Medicare   ? Authorization Time Period POC written for 12 weeks, may d/c after 8 weeks   ? Authorization - Visit Number 1   ? Authorization - Number of Visits 10   ? Progress Note Due on Visit 10   ? OT Start Time 1020   ? OT Stop Time 1100   ? OT Time Calculation (min) 40 min   ? ?  ?  ? ?  ? ? ?Past Medical History:  ?Diagnosis Date  ? Acute on chronic systolic CHF (congestive heart failure), NYHA class 4 ([place]) 10/19/2020  ? Asthma   ? Back pain 09/18/2019  ? Cervical disc disorder with radiculopathy of cervical region 09/24/2018  ? Chronic systolic CHF (congestive heart failure) ([place])   ? a. cMRI 4/15: EF 34% and findings - c/w NICM, normal RV size and function (RVEF 61%), Mild MR // b. Echo 2/15:  EF 30-35%, diff HK, ant-sept AK, Gr 2 DD, mild MR, trivial TR  //  c. Echo 5/17: EF 20-25%, severe diffuse HK, marked systolic dyssynchrony, grade 1 diastolic dysfunction, mild MR  //  d. RHC 5/17: Fick CO 2.9, RVSP 19, PASP 15, PW mean 2, low filing pressures and preserved CO   ? Cognitive impairment 10/19/2017  ? MOCA was administered with a score of 23/30  ? Diabetes mellitus   ? Diabetic peripheral neuropathy ([place]) 12/09/2011  ? Dyspnea 09/19/2012  ? Flu 10/17/2017  ? Gastritis   ? History of echocardiogram   ? Echo 6/18: EF 30-35, diffuse HK, grade 1 diastolic dysfunction, trivial MR, mild LAE, mild TR, no pericardial effusion  ? History of nuclear stress test   ? Myoview 5/18: EF 49, no ischemia, inferoseptal defect c/w LBBB artifact (intermediate risk due to EF < 50).  ? HTN (hypertension)   ? Hyperlipidemia   ? Hyperlipidemia associated with type 2 diabetes mellitus  ([place]) 03/05/2019  ? Hypertension associated with diabetes ([place]) 09/28/2006  ? Qualifier: Diagnosis of  By: [name]    ? Major depression 03/31/2013  ? Melena 08/21/2020  ? NICM (nonischemic cardiomyopathy) ([place])   ? a. Nuclear 5/13: Normal stress nuclear study. LV Ejection Fraction: 58%  //  b. LHC 10/14: Minor luminal irregularity in prox LAD, EF 35%   ? Parkinson disease ([place])   ? Plantar fasciitis   ? Plantar fasciitis, bilateral 06/15/2012  ? Rosacea 05/29/2009  ? Qualifier: Diagnosis of  By: [name] MD, Rodman Key    ? Sensorineural hearing loss (SNHL), bilateral 01/04/2017  ? Sleep apnea   ? was retested and no longer had it and so d/c CPAP  ? Syncope   ? Thoracic or lumbosacral neuritis or radiculitis, unspecified 07/03/2013  ? Tinnitus, bilateral 01/04/2017  ? Type II diabetes mellitus with complication ([place]) 4/74/2595  ? Diabetic eye exam done by [facility]mology: Dr. Prudencio Burly on 11/08/13: no diabetic retinopathy. Repeat in 1 year    ? Urticaria   ? ?Past Surgical History:  ?Procedure Laterality Date  ? BREAST EXCISIONAL BIOPSY Left 1970  ? benign cyst  ? BREAST SURGERY Left   ? BUNIONECTOMY    ?  CARDIAC CATHETERIZATION N/A 12/16/2015  ? Procedure: Right Heart Cath;  Surgeon: Sherren Mocha, MD;  Location: [facility] CV LAB;  Service: Cardiovascular;  Laterality: N/A;  ? CHOLECYSTECTOMY    ? eye lid surgery Bilateral 05/09/2019  ? IMPLANTABLE CARDIOVERTER DEFIBRILLATOR IMPLANT  11-25-13  ? MDT dual chamber ICD implanted by [name] for primary prevention  ? IMPLANTABLE CARDIOVERTER DEFIBRILLATOR IMPLANT N/A 11/25/2013  ? Procedure: IMPLANTABLE CARDIOVERTER DEFIBRILLATOR IMPLANT;  Surgeon: [name], MD;  Location: [facility] CATH LAB;  Service: Cardiovascular;  Laterality: N/A;  ? LEFT AND RIGHT HEART CATHETERIZATION WITH CORONARY ANGIOGRAM N/A 05/31/2013  ? Procedure: LEFT AND RIGHT HEART CATHETERIZATION WITH CORONARY ANGIOGRAM;  Surgeon: [name], MD;  Location: [facility] CATH LAB;  Service: Cardiovascular;   Laterality: N/A;  ? TONSILLECTOMY    ? TUBAL LIGATION    ? ?Patient Active Problem List  ? Diagnosis Date Noted  ? Type 2 diabetes mellitus with diabetic polyneuropathy, without long-term current use of insulin ([place]) 09/06/2021  ? Type 2 diabetes mellitus with stage 3a chronic kidney disease, with long-term current use of insulin ([place]) 09/06/2021  ? Dyslipidemia 09/06/2021  ? 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 Orthostatic hypotension 07/27/2021  ? Mood changes 07/27/2021  ? Weight loss 05/07/2021  ? Acute on chronic systolic CHF (congestive heart failure), NYHA class 4 ([place]) 10/19/2020  ? Chronic HFrEF (heart failure with reduced ejection fraction) ([place])   ? Cervical dystonia 10/07/2020  ? Advanced care planning/counseling discussion 08/21/2020  ? Dementia without behavioral disturbance ([place]) 08/21/2020  ? Vision impairment, Right Eye 08/21/2020  ? Chronic kidney disease, stage 3a ([place]) 08/18/2020  ? Chronic rhinitis 12/18/2019  ? Chronic respiratory failure with hypoxia ([place]) 12/18/2019  ? Hyperlipidemia associated with type 2 diabetes mellitus ([place]) 03/05/2019  ? Cervical radiculopathy 10/24/2018  ? Falls frequently   ? Asthma 08/03/2017  ? Osteopenia 07/10/2015  ? Mild persistent asthma in adult without complication 29/47/6546  ? NICM (nonischemic cardiomyopathy) ([place]) 04/23/2014  ? Arthritis or polyarthritis, rheumatoid ([place]) 03/12/2014  ? ICD (implantable cardioverter-defibrillator), dual, in situ 03/06/2014  ? Heart Failure with Recovered Ejection Fraction, G2DD 05/31/2013  ? Parkinson disease ([place]) 05/21/2013  ? Vertigo 03/03/2013  ? Gastroesophageal reflux disease without esophagitis 09/27/2012  ? Trigger point with neck pain 04/12/2012  ? 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  ? Diabetic peripheral neuropathy ([place]) 12/09/2011  ? Chronic urticaria 05/27/2011  ? Sinus tachycardia ([place]) 05/27/2011  ? 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 05/20/2011  ? Type II diabetes mellitus with complication ([place])  50/35/4656  ? Hypertension associated with diabetes ([place]) 09/28/2006  ? ? ?ONSET DATE: 11/26/21 ? ?REFERRING DIAG: Parkinson's disease ? ?THERAPY DIAG:  ?Other lack of coordination - Plan: Ot plan of care cert/re-cert ? ?Other symptoms and signs involving the nervous system - Plan: Ot plan of care cert/re-cert ? ?Other abnormalities of gait and mobility - Plan: Ot plan of care cert/re-cert ? ?Unsteadiness on feet - Plan: Ot plan of care cert/re-cert ? ?Other disturbances of skin sensation - Plan: Ot plan of care cert/re-cert ? ?Stiffness of right shoulder, not elsewhere classified - Plan: Ot plan of care cert/re-cert ? ?Stiffness of left shoulder, not elsewhere classified - Plan: Ot plan of care cert/re-cert ? ?Stiffness of left elbow, not elsewhere classified - Plan: Ot plan of care cert/re-cert ? ?Stiffness of right elbow, not elsewhere classified - Plan: Ot plan of care cert/re-cert ? ?SUBJECTIVE:  ? ?SUBJECTIVE STATEMENT: ?Pt wants to stay as I as  she can ?Pt accompanied by: significant other husband  ? ?PERTINENT HISTORY: Patient is a 71 year old female referred to Neuro OPOT for PD.  Pt received PT in 2021 but no OT.  ? PMH is significant for:  PD, chronic systolic CHF, diabetes, HTN, HLD, mild persistent asthma, cervical dystonia, chronic kidney disease stage III, peripheral neuropathy, pacemaker / ICD ?Referred by Dr Alicia Amel 11/26/21 ? ?PRECAUTIONS: impaired sensation both hands due to neuropathy, pacemaker/ICD, fall risk ? ?WEIGHT BEARING RESTRICTIONS No ? ?PAIN:  ?Are you having pain? Yes: Pain location: hands ?Pain description: 5/10 for hands, shoulders 7/10 knees ?Aggravating factors: movement ?Relieving factors: meds ? ?FALLS: Has patient fallen in last 6 months? Yes. Number of falls more than 5 falls ? ?LIVING ENVIRONMENT: ?Lives with: lives with their spouse ?Lives in: House/apartment ?Stairs: No ?Has following equipment at home:  rollator , tub transfer bench, grab bars ? ?PLOF: Independent with basic  ADLs and Independent with gait ? ?PATIENT GOALS to be able to do more for myself  ? ?OBJECTIVE:  ? ?HAND DOMINANCE: Right ? ?ADLs: ?Overall ADLs: increased time required for all activities ?Transfers/amb

## 2021-11-30 NOTE — Therapy (Signed)
?OUTPATIENT PHYSICAL THERAPY TREATMENT NOTE ? ? ?Patient Name: Veronica Shaw ?MRN: [id number] ?DOB:1951/04/09, 71 y.o., female ?Today's Date: 11/30/2021 ? ?PCP: Lyndee Hensen, DO ?REFERRING PROVIDER: Ludwig Clarks, DO ? ?END OF SESSION:  ? PT End of Session - 11/30/21 0929   ? ? Visit Number 3   ? Number of Visits 17   ? Date for PT Re-Evaluation 02/01/22   ? Authorization Type Medicare   ? Progress Note Due on Visit 10   ? PT Start Time [phone]   ? PT Stop Time 1010   ? PT Time Calculation (min) 43 min   ? Equipment Utilized During Treatment Gait belt   ? Activity Tolerance Patient tolerated treatment well   ? Behavior During Therapy [facility] for tasks assessed/performed   ? ?  ?  ? ?  ? ? ? ?Past Medical History:  ?Diagnosis Date  ? Acute on chronic systolic CHF (congestive heart failure), NYHA class 4 ([place]) 10/19/2020  ? Asthma   ? Back pain 09/18/2019  ? Cervical disc disorder with radiculopathy of cervical region 09/24/2018  ? Chronic systolic CHF (congestive heart failure) ([place])   ? a. cMRI 4/15: EF 34% and findings - c/w NICM, normal RV size and function (RVEF 61%), Mild MR // b. Echo 2/15:  EF 30-35%, diff HK, ant-sept AK, Gr 2 DD, mild MR, trivial TR  //  c. Echo 5/17: EF 20-25%, severe diffuse HK, marked systolic dyssynchrony, grade 1 diastolic dysfunction, mild MR  //  d. RHC 5/17: Fick CO 2.9, RVSP 19, PASP 15, PW mean 2, low filing pressures and preserved CO   ? Cognitive impairment 10/19/2017  ? MOCA was administered with a score of 23/30  ? Diabetes mellitus   ? Diabetic peripheral neuropathy ([place]) 12/09/2011  ? Dyspnea 09/19/2012  ? Flu 10/17/2017  ? Gastritis   ? History of echocardiogram   ? Echo 6/18: EF 30-35, diffuse HK, grade 1 diastolic dysfunction, trivial MR, mild LAE, mild TR, no pericardial effusion  ? History of nuclear stress test   ? Myoview 5/18: EF 49, no ischemia, inferoseptal defect c/w LBBB artifact (intermediate risk due to EF < 50).  ? HTN (hypertension)   ? Hyperlipidemia   ?  Hyperlipidemia associated with type 2 diabetes mellitus ([place]) 03/05/2019  ? Hypertension associated with diabetes ([place]) 09/28/2006  ? Qualifier: Diagnosis of  By: [name]    ? Major depression 03/31/2013  ? Melena 08/21/2020  ? NICM (nonischemic cardiomyopathy) ([place])   ? a. Nuclear 5/13: Normal stress nuclear study. LV Ejection Fraction: 58%  //  b. LHC 10/14: Minor luminal irregularity in prox LAD, EF 35%   ? Parkinson disease ([place])   ? Plantar fasciitis   ? Plantar fasciitis, bilateral 06/15/2012  ? Rosacea 05/29/2009  ? Qualifier: Diagnosis of  By: [name] MD, Rodman Key    ? Sensorineural hearing loss (SNHL), bilateral 01/04/2017  ? Sleep apnea   ? was retested and no longer had it and so d/c CPAP  ? Syncope   ? Thoracic or lumbosacral neuritis or radiculitis, unspecified 07/03/2013  ? Tinnitus, bilateral 01/04/2017  ? Type II diabetes mellitus with complication ([place]) 08/19/1476  ? Diabetic eye exam done by [facility] Ophthalmology: Dr. Prudencio Burly on 11/08/13: no diabetic retinopathy. Repeat in 1 year    ? Urticaria   ? ?Past Surgical History:  ?Procedure Laterality Date  ? BREAST EXCISIONAL BIOPSY Left 1970  ? benign cyst  ? BREAST SURGERY Left   ? BUNIONECTOMY    ?  CARDIAC CATHETERIZATION N/A 12/16/2015  ? Procedure: Right Heart Cath;  Surgeon: Sherren Mocha, MD;  Location: [facility];  Service: Cardiovascular;  Laterality: N/A;  ? CHOLECYSTECTOMY    ? eye lid surgery Bilateral 05/09/2019  ? IMPLANTABLE CARDIOVERTER DEFIBRILLATOR IMPLANT  11-25-13  ? MDT dual chamber ICD implanted by [name] for primary prevention  ? IMPLANTABLE CARDIOVERTER DEFIBRILLATOR IMPLANT N/A 11/25/2013  ? Procedure: IMPLANTABLE CARDIOVERTER DEFIBRILLATOR IMPLANT;  Surgeon: [name], MD;  Location: [facility] CATH LAB;  Service: Cardiovascular;  Laterality: N/A;  ? LEFT AND RIGHT HEART CATHETERIZATION WITH CORONARY ANGIOGRAM N/A 05/31/2013  ? Procedure: LEFT AND RIGHT HEART CATHETERIZATION WITH CORONARY ANGIOGRAM;  Surgeon: [name],  MD;  Location: [facility] CATH LAB;  Service: Cardiovascular;  Laterality: N/A;  ? TONSILLECTOMY    ? TUBAL LIGATION    ? ?Patient Active Problem List  ? Diagnosis Date Noted  ? Type 2 diabetes mellitus with diabetic polyneuropathy, without long-term current use of insulin ([place]) 09/06/2021  ? Type 2 diabetes mellitus with stage 3a chronic kidney disease, with long-term current use of insulin ([place]) 09/06/2021  ? Dyslipidemia 09/06/2021  ? 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 Orthostatic hypotension 07/27/2021  ? Mood changes 07/27/2021  ? Weight loss 05/07/2021  ? Acute on chronic systolic CHF (congestive heart failure), NYHA class 4 ([place]) 10/19/2020  ? Chronic HFrEF (heart failure with reduced ejection fraction) ([place])   ? Cervical dystonia 10/07/2020  ? Advanced care planning/counseling discussion 08/21/2020  ? Dementia without behavioral disturbance ([place]) 08/21/2020  ? Vision impairment, Right Eye 08/21/2020  ? Chronic kidney disease, stage 3a ([place]) 08/18/2020  ? Chronic rhinitis 12/18/2019  ? Chronic respiratory failure with hypoxia ([place]) 12/18/2019  ? Hyperlipidemia associated with type 2 diabetes mellitus ([place]) 03/05/2019  ? Cervical radiculopathy 10/24/2018  ? Falls frequently   ? Asthma 08/03/2017  ? Osteopenia 07/10/2015  ? Mild persistent asthma in adult without complication 03/14/4817  ? NICM (nonischemic cardiomyopathy) ([place]) 04/23/2014  ? Arthritis or polyarthritis, rheumatoid ([place]) 03/12/2014  ? ICD (implantable cardioverter-defibrillator), dual, in situ 03/06/2014  ? Heart Failure with Recovered Ejection Fraction, G2DD 05/31/2013  ? Parkinson disease ([place]) 05/21/2013  ? Vertigo 03/03/2013  ? Gastroesophageal reflux disease without esophagitis 09/27/2012  ? Trigger point with neck pain 04/12/2012  ? 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  ? Diabetic peripheral neuropathy ([place]) 12/09/2011  ? Chronic urticaria 05/27/2011  ? Sinus tachycardia ([place]) 05/27/2011  ? 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  05/20/2011  ? Type II diabetes mellitus with complication ([place]) 56/31/4970  ? Hypertension associated with diabetes ([place]) 09/28/2006  ? ? ?REFERRING DIAG: G20 (ICD-10-CM) - Parkinson's disease ([place])  ? ?THERAPY DIAG:  ?Unsteadiness on feet ? ?History of falling ? ?Other symptoms and signs involving the nervous system ? ?PERTINENT HISTORY: PMH: PD, chronic systolic CHF, diabetes, HTN, HLD, mild persistent asthma, cervical dystonia, chronic kidney disease stage III, peripheral neuropathy.  ?  ? ?PRECAUTIONS: Fall, no driving, defibrillator  ? ?SUBJECTIVE: No changes, some almost falls when using her cane. Exercises are going well at home.  ? ? ?  ? ?  ?TODAY'S TREATMENT:  ?SciFit with BUE/BLE for reciprocal movement, strength, and activity tolerance at gear 1.7 for 5 minutes. Cues for incr ROM.  ? ?GAIT: ?Gait pattern: step through pattern, decreased stance time- Right, decreased stride length, decreased ankle dorsiflexion- Right, shuffling, and poor foot clearance- Right ?Distance walked: 345' x 1, plus additional distances.  ?Assistive device utilized: Environmental consultant - 4  wheeled rollator  ?Level of assistance: SBA ? ?Cued for longer stride length and heel strike.  Took pt a little over 5 minutes to walk 3 laps in gym. Educated on walking program to perform at home w/ rollator to work on general strengthening/endurance. Cued to focus on best walking technique/posture and to use rollator for safety and to begin at 4 minutes (see pt instructions for more details) 2x a day. Pt has a loop that she can use for ambulation in her house. Currently pt spends the majority of her day sitting ? ?Pt needs cues throughout session for proper brake management with rollator before sit <> stands and using BUE to press up from mat table.  ? ?NMR: ?-alternating forward SLS taps at bottom of staircase to 6" step x10 reps,then to 2nd step x10 reps, progressing to cross body taps at 6" step x10 reps each side. Performed without UE support for all,  cues for incr foot clearance, pt with incr difficulty with SLS on LLE.  ? ?-PWR Step at countertop (reviewed from HEP)- cued for incr step height and weight shift when stepping and reaching performed x5 reps each side, t

## 2021-11-30 NOTE — Patient Instructions (Signed)
WALKING ? ?Walking is a great form of exercise to increase your strength, endurance and overall fitness.  A walking program can help you start slowly and gradually build endurance as you go.  Everyone's ability is different, so each person's starting point will be different.  You do not have to follow them exactly.  The are just samples. You should simply find out what's right for you and stick to that program.   ?In the beginning, you'll start off walking 2-3 times a day for short distances.  As you get stronger, you'll be walking further at just 1-2 times per day. ? ?A. You Can Walk For A Certain Length Of Time Each Day ?  ? Walk 4 minutes 2 times per day. ? Increase a couple minutes every week.  ? Work up to 10-12 minutes (1-2 times per day). ? ? Example: ?  Day 1-7 4-5 minutes 2 times per day ?  Day 7-14 6-7 minutes 2 times per day ?  Day 14-21 7-8 minutes 1-2 times per day ? ?Use your rollator - think about tall posture, picking up your feet and taking longer steps. Using your best walking around your living/dining room area at home! ?   ? ?

## 2021-12-01 ENCOUNTER — Encounter: Payer: Self-pay | Admitting: Occupational Therapy

## 2021-12-01 NOTE — Therapy (Incomplete)
?OUTPATIENT OCCUPATIONAL THERAPY TREATMENT NOTE ? ? ?Patient Name: Veronica Shaw ?MRN: [id number] ?DOB:Dec 11, 1950, 71 y.o., female ?Today's Date: 12/01/2021 ? ?PCP: Dr. Susa Simmonds  ?REFERRING PROVIDER: Dr. Wells Guiles Tat ? ?END OF SESSION:  ? ? ?Past Medical History:  ?Diagnosis Date  ? Acute on chronic systolic CHF (congestive heart failure), NYHA class 4 ([place]) 10/19/2020  ? Asthma   ? Back pain 09/18/2019  ? Cervical disc disorder with radiculopathy of cervical region 09/24/2018  ? Chronic systolic CHF (congestive heart failure) ([place])   ? a. cMRI 4/15: EF 34% and findings - c/w NICM, normal RV size and function (RVEF 61%), Mild MR // b. Echo 2/15:  EF 30-35%, diff HK, ant-sept AK, Gr 2 DD, mild MR, trivial TR  //  c. Echo 5/17: EF 20-25%, severe diffuse HK, marked systolic dyssynchrony, grade 1 diastolic dysfunction, mild MR  //  d. RHC 5/17: Fick CO 2.9, RVSP 19, PASP 15, PW mean 2, low filing pressures and preserved CO   ? Cognitive impairment 10/19/2017  ? MOCA was administered with a score of 23/30  ? Diabetes mellitus   ? Diabetic peripheral neuropathy ([place]) 12/09/2011  ? Dyspnea 09/19/2012  ? Flu 10/17/2017  ? Gastritis   ? History of echocardiogram   ? Echo 6/18: EF 30-35, diffuse HK, grade 1 diastolic dysfunction, trivial MR, mild LAE, mild TR, no pericardial effusion  ? History of nuclear stress test   ? Myoview 5/18: EF 49, no ischemia, inferoseptal defect c/w LBBB artifact (intermediate risk due to EF < 50).  ? HTN (hypertension)   ? Hyperlipidemia   ? Hyperlipidemia associated with type 2 diabetes mellitus ([place]) 03/05/2019  ? Hypertension associated with diabetes ([place]) 09/28/2006  ? Qualifier: Diagnosis of  By: [name]    ? Major depression 03/31/2013  ? Melena 08/21/2020  ? NICM (nonischemic cardiomyopathy) ([place])   ? a. Nuclear 5/13: Normal stress nuclear study. LV Ejection Fraction: 58%  //  b. LHC 10/14: Minor luminal irregularity in prox LAD, EF 35%   ? Parkinson disease ([place])   ? Plantar fasciitis   ?  Plantar fasciitis, bilateral 06/15/2012  ? Rosacea 05/29/2009  ? Qualifier: Diagnosis of  By: [name] MD, Rodman Key    ? Sensorineural hearing loss (SNHL), bilateral 01/04/2017  ? Sleep apnea   ? was retested and no longer had it and so d/c CPAP  ? Syncope   ? Thoracic or lumbosacral neuritis or radiculitis, unspecified 07/03/2013  ? Tinnitus, bilateral 01/04/2017  ? Type II diabetes mellitus with complication ([place]) 4/62/7035  ? Diabetic eye exam done by [facility] Ophthalmology: Dr. Prudencio Burly on 11/08/13: no diabetic retinopathy. Repeat in 1 year    ? Urticaria   ? ?Past Surgical History:  ?Procedure Laterality Date  ? BREAST EXCISIONAL BIOPSY Left 1970  ? benign cyst  ? BREAST SURGERY Left   ? BUNIONECTOMY    ? CARDIAC CATHETERIZATION N/A 12/16/2015  ? Procedure: Right Heart Cath;  Surgeon: Sherren Mocha, MD;  Location: [facility];  Service: Cardiovascular;  Laterality: N/A;  ? CHOLECYSTECTOMY    ? eye lid surgery Bilateral 05/09/2019  ? IMPLANTABLE CARDIOVERTER DEFIBRILLATOR IMPLANT  11-25-13  ? MDT dual chamber ICD implanted by [name] for primary prevention  ? IMPLANTABLE CARDIOVERTER DEFIBRILLATOR IMPLANT N/A 11/25/2013  ? Procedure: IMPLANTABLE CARDIOVERTER DEFIBRILLATOR IMPLANT;  Surgeon: [name], MD;  Location: [facility] CATH LAB;  Service: Cardiovascular;  Laterality: N/A;  ? LEFT AND RIGHT HEART CATHETERIZATION WITH CORONARY ANGIOGRAM N/A 05/31/2013  ?  Procedure: LEFT AND RIGHT HEART CATHETERIZATION WITH CORONARY ANGIOGRAM;  Surgeon: [name], MD;  Location: [facility] CATH LAB;  Service: Cardiovascular;  Laterality: N/A;  ? TONSILLECTOMY    ? TUBAL LIGATION    ? ?Patient Active Problem List  ? Diagnosis Date Noted  ? Type 2 diabetes mellitus with diabetic polyneuropathy, without long-term current use of insulin ([place]) 09/06/2021  ? Type 2 diabetes mellitus with stage 3a chronic kidney disease, with long-term current use of insulin ([place]) 09/06/2021  ? Dyslipidemia 09/06/2021  ? HFmrEF (heart failure with mildly  reduced EF) 08/19/2021  ? Orthostatic hypotension 07/27/2021  ? Mood changes 07/27/2021  ? Weight loss 05/07/2021  ? Acute on chronic systolic CHF (congestive heart failure), NYHA class 4 ([place]) 10/19/2020  ? Chronic HFrEF (heart failure with reduced ejection fraction) ([place])   ? Cervical dystonia 10/07/2020  ? Advanced care planning/counseling discussion 08/21/2020  ? Dementia without behavioral disturbance ([place]) 08/21/2020  ? Vision impairment, Right Eye 08/21/2020  ? Chronic kidney disease, stage 3a ([place]) 08/18/2020  ? Chronic rhinitis 12/18/2019  ? Chronic respiratory failure with hypoxia ([place]) 12/18/2019  ? Hyperlipidemia associated with type 2 diabetes mellitus ([place]) 03/05/2019  ? Cervical radiculopathy 10/24/2018  ? Falls frequently   ? Asthma 08/03/2017  ? Osteopenia 07/10/2015  ? Mild persistent asthma in adult without complication 16/01/3709  ? NICM (nonischemic cardiomyopathy) ([place]) 04/23/2014  ? Arthritis or polyarthritis, rheumatoid ([place]) 03/12/2014  ? ICD (implantable cardioverter-defibrillator), dual, in situ 03/06/2014  ? Heart Failure with Recovered Ejection Fraction, G2DD 05/31/2013  ? Parkinson disease ([place]) 05/21/2013  ? Vertigo 03/03/2013  ? Gastroesophageal reflux disease without esophagitis 09/27/2012  ? Trigger point with neck pain 04/12/2012  ? 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  ? Diabetic peripheral neuropathy ([place]) 12/09/2011  ? Chronic urticaria 05/27/2011  ? Sinus tachycardia ([place]) 05/27/2011  ? 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 05/20/2011  ? Type II diabetes mellitus with complication ([place]) 62/69/4854  ? Hypertension associated with diabetes ([place]) 09/28/2006  ? ? ?ONSET DATE: 11/26/21  ? ?REFERRING DIAG: Parkinson's Disease ? ?THERAPY DIAG:  ?No diagnosis found. ? ? ?PERTINENT HISTORY: Patient is a 71 year old female referred to Neuro OPOT for PD.  Pt received PT in 2021 but no OT.  ? PMH is significant for:  PD, chronic systolic CHF, diabetes, HTN, HLD, mild persistent asthma, cervical dystonia,  chronic kidney disease stage III, peripheral neuropathy, pacemaker / ICD ? ?PRECAUTIONS: impaired sensation both hands due to neuropathy, pacemaker/ICD, fall risk  ? ?SUBJECTIVE: *** ? ?PAIN:  ?Are you having pain? {[id number]} ? ? ? ? ?OBJECTIVE:  ? ?TODAY'S TREATMENT: ?*** ? ?PATIENT EDUCATION: ?Education details: *** ?Person educated: Patient ?Education method: Explanation ?Education comprehension: verbalized understanding ?  ?  ?HOME EXERCISE PROGRAM: ?N/A ?  ?ASSESSMENT: ?  ?CLINICAL IMPRESSION: ?*** Patient is a 71 y.o. female who was seen today for occupational therapy evaluation for Parkinson's disease.Patient has not received OT before. Pt presents with a decline in ADL performance, with decreased coordination, ROM, bradykinesia, sensory deficits, decreased balance and cognitive deficits. Pt's PMH is significant for:  PD, chronic systolic CHF, diabetes, HTN, HLD, mild persistent asthma, cervical dystonia, chronic kidney disease stage III, peripheral neuropathy, pacemaker/ ICD. ?  ?PERFORMANCE DEFICITS in functional skills including ADLs, IADLs, coordination, dexterity, sensation, tone, ROM, strength, flexibility, FMC, GMC, mobility, balance, decreased knowledge of precautions, decreased knowledge of use of DME, vision, and UE functional use, cognitive skills including attention, memory, problem solving, safety awareness, thought, and understand, and psychosocial  skills including coping strategies, environmental adaptation, and routines and behaviors.  ?  ?IMPAIRMENTS are limiting patient from ADLs, IADLs, rest and sleep, play, leisure, and social participation.  ?  ?COMORBIDITIES may have co-morbidities  that affects occupational performance. Patient will benefit from skilled OT to address above impairments and improve overall function. ?  ?MODIFICATION OR ASSISTANCE TO COMPLETE EVALUATION: Min-Moderate modification of tasks or assist with assess necessary to complete an evaluation.  ?  ?OT OCCUPATIONAL  PROFILE AND HISTORY: Detailed assessment: Review of records and additional review of physical, cognitive, psychosocial history related to current functional performance. ?  ?CLINICAL DECISION MAKING: M

## 2021-12-02 ENCOUNTER — Ambulatory Visit: Payer: Medicare Other | Admitting: Occupational Therapy

## 2021-12-02 ENCOUNTER — Ambulatory Visit: Payer: Medicare Other | Admitting: Physical Therapy

## 2021-12-02 DIAGNOSIS — R29818 Other symptoms and signs involving the nervous system: Secondary | ICD-10-CM

## 2021-12-02 DIAGNOSIS — M25611 Stiffness of right shoulder, not elsewhere classified: Secondary | ICD-10-CM

## 2021-12-02 DIAGNOSIS — R2689 Other abnormalities of gait and mobility: Secondary | ICD-10-CM | POA: Diagnosis not present

## 2021-12-02 DIAGNOSIS — R278 Other lack of coordination: Secondary | ICD-10-CM | POA: Diagnosis not present

## 2021-12-02 DIAGNOSIS — R2681 Unsteadiness on feet: Secondary | ICD-10-CM

## 2021-12-02 DIAGNOSIS — Z9181 History of falling: Secondary | ICD-10-CM | POA: Diagnosis not present

## 2021-12-02 DIAGNOSIS — R208 Other disturbances of skin sensation: Secondary | ICD-10-CM | POA: Diagnosis not present

## 2021-12-02 DIAGNOSIS — M25622 Stiffness of left elbow, not elsewhere classified: Secondary | ICD-10-CM

## 2021-12-02 NOTE — Therapy (Signed)
?OUTPATIENT PHYSICAL THERAPY TREATMENT NOTE ? ? ?Patient Name: Veronica Shaw ?MRN: [id number] ?DOB:[birth year], 71 y.o., female ?Today's Date: 12/02/2021 ? ?PCP: Lyndee Hensen, DO ?REFERRING PROVIDER: Ludwig Clarks, DO ? ?END OF SESSION:  ? PT End of Session - 12/02/21 0938   ? ? Visit Number 4   ? Number of Visits 17   ? Date for PT Re-Evaluation 02/01/22   ? Authorization Type Medicare   ? Progress Note Due on Visit 10   ? PT Start Time [phone]   ? PT Stop Time 1014   ? PT Time Calculation (min) 38 min   ? Equipment Utilized During Treatment Gait belt   ? Activity Tolerance Patient tolerated treatment well   ? Behavior During Therapy [facility] for tasks assessed/performed   ? ?  ?  ? ?  ? ? ? ? ?Past Medical History:  ?Diagnosis Date  ? Acute on chronic systolic CHF (congestive heart failure), NYHA class 4 ([place]) 10/19/2020  ? Asthma   ? Back pain 09/18/2019  ? Cervical disc disorder with radiculopathy of cervical region 09/24/2018  ? Chronic systolic CHF (congestive heart failure) ([place])   ? a. cMRI 4/15: EF 34% and findings - c/w NICM, normal RV size and function (RVEF 61%), Mild MR // b. Echo 2/15:  EF 30-35%, diff HK, ant-sept AK, Gr 2 DD, mild MR, trivial TR  //  c. Echo 5/17: EF 20-25%, severe diffuse HK, marked systolic dyssynchrony, grade 1 diastolic dysfunction, mild MR  //  d. RHC 5/17: Fick CO 2.9, RVSP 19, PASP 15, PW mean 2, low filing pressures and preserved CO   ? Cognitive impairment 10/19/2017  ? MOCA was administered with a score of 23/30  ? Diabetes mellitus   ? Diabetic peripheral neuropathy ([place]) 12/09/2011  ? Dyspnea 09/19/2012  ? Flu 10/17/2017  ? Gastritis   ? History of echocardiogram   ? Echo 6/18: EF 30-35, diffuse HK, grade 1 diastolic dysfunction, trivial MR, mild LAE, mild TR, no pericardial effusion  ? History of nuclear stress test   ? Myoview 5/18: EF 49, no ischemia, inferoseptal defect c/w LBBB artifact (intermediate risk due to EF < 50).  ? HTN (hypertension)   ? Hyperlipidemia   ?  Hyperlipidemia associated with type 2 diabetes mellitus ([place]) 03/05/2019  ? Hypertension associated with diabetes ([place]) 09/28/2006  ? Qualifier: Diagnosis of  By: [name]    ? Major depression 03/31/2013  ? Melena 08/21/2020  ? NICM (nonischemic cardiomyopathy) ([place])   ? a. Nuclear 5/13: Normal stress nuclear study. LV Ejection Fraction: 58%  //  b. LHC 10/14: Minor luminal irregularity in prox LAD, EF 35%   ? Parkinson disease ([place])   ? Plantar fasciitis   ? Plantar fasciitis, bilateral 06/15/2012  ? Rosacea 05/29/2009  ? Qualifier: Diagnosis of  By: [name] MD, Rodman Key    ? Sensorineural hearing loss (SNHL), bilateral 01/04/2017  ? Sleep apnea   ? was retested and no longer had it and so d/c CPAP  ? Syncope   ? Thoracic or lumbosacral neuritis or radiculitis, unspecified 07/03/2013  ? Tinnitus, bilateral 01/04/2017  ? Type II diabetes mellitus with complication ([place]) 9/37/1696  ? Diabetic eye exam done by [facility] Ophthalmology: Dr. Prudencio Burly on 11/08/13: no diabetic retinopathy. Repeat in 1 year    ? Urticaria   ? ?Past Surgical History:  ?Procedure Laterality Date  ? BREAST EXCISIONAL BIOPSY Left 1970  ? benign cyst  ? BREAST SURGERY Left   ?  BUNIONECTOMY    ? CARDIAC CATHETERIZATION N/A 12/16/2015  ? Procedure: Right Heart Cath;  Surgeon: Sherren Mocha, MD;  Location: [facility];  Service: Cardiovascular;  Laterality: N/A;  ? CHOLECYSTECTOMY    ? eye lid surgery Bilateral 05/09/2019  ? IMPLANTABLE CARDIOVERTER DEFIBRILLATOR IMPLANT  11-25-13  ? MDT dual chamber ICD implanted by [name] for primary prevention  ? IMPLANTABLE CARDIOVERTER DEFIBRILLATOR IMPLANT N/A 11/25/2013  ? Procedure: IMPLANTABLE CARDIOVERTER DEFIBRILLATOR IMPLANT;  Surgeon: [name], MD;  Location[facility] CATH LAB;  Service: Cardiovascular;  Laterality: N/A;  ? LEFT AND RIGHT HEART CATHETERIZATION WITH CORONARY ANGIOGRAM N/A 05/31/2013  ? Procedure: LEFT AND RIGHT HEART CATHETERIZATION WITH CORONARY ANGIOGRAM;  Surgeon: [name],  MD;  Location: [facility] CATH LAB;  Service: Cardiovascular;  Laterality: N/A;  ? TONSILLECTOMY    ? TUBAL LIGATION    ? ?Patient Active Problem List  ? Diagnosis Date Noted  ? Type 2 diabetes mellitus with diabetic polyneuropathy, without long-term current use of insulin ([place]) 09/06/2021  ? Type 2 diabetes mellitus with stage 3a chronic kidney disease, with long-term current use of insulin ([place]) 09/06/2021  ? Dyslipidemia 09/06/2021  ? 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 Orthostatic hypotension 07/27/2021  ? Mood changes 07/27/2021  ? Weight loss 05/07/2021  ? Acute on chronic systolic CHF (congestive heart failure), NYHA class 4 ([place]) 10/19/2020  ? Chronic HFrEF (heart failure with reduced ejection fraction) ([place])   ? Cervical dystonia 10/07/2020  ? Advanced care planning/counseling discussion 08/21/2020  ? Dementia without behavioral disturbance ([place]) 08/21/2020  ? Vision impairment, Right Eye 08/21/2020  ? Chronic kidney disease, stage 3a ([place]) 08/18/2020  ? Chronic rhinitis 12/18/2019  ? Chronic respiratory failure with hypoxia ([place]) 12/18/2019  ? Hyperlipidemia associated with type 2 diabetes mellitus ([place]) 03/05/2019  ? Cervical radiculopathy 10/24/2018  ? Falls frequently   ? Asthma 08/03/2017  ? Osteopenia 07/10/2015  ? Mild persistent asthma in adult without complication 18/56/3149  ? NICM (nonischemic cardiomyopathy) ([place]) 04/23/2014  ? Arthritis or polyarthritis, rheumatoid ([place]) 03/12/2014  ? ICD (implantable cardioverter-defibrillator), dual, in situ 03/06/2014  ? Heart Failure with Recovered Ejection Fraction, G2DD 05/31/2013  ? Parkinson disease ([place]) 05/21/2013  ? Vertigo 03/03/2013  ? Gastroesophageal reflux disease without esophagitis 09/27/2012  ? Trigger point with neck pain 04/12/2012  ? 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  ? Diabetic peripheral neuropathy ([place]) 12/09/2011  ? Chronic urticaria 05/27/2011  ? Sinus tachycardia ([place]) 05/27/2011  ? 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  05/20/2011  ? Type II diabetes mellitus with complication ([place]) 70/26/3785  ? Hypertension associated with diabetes ([place]) 09/28/2006  ? ? ?REFERRING DIAG: G20 (ICD-10-CM) - Parkinson's disease ([place])  ? ?THERAPY DIAG:  ?Other abnormalities of gait and mobility ? ?Unsteadiness on feet ? ?PERTINENT HISTORY: PMH: PD, chronic systolic CHF, diabetes, HTN, HLD, mild persistent asthma, cervical dystonia, chronic kidney disease stage III, peripheral neuropathy.  ?  ?PAIN:  ?Are you having pain? Yes ?NPRS scale: 6/10 ?Pain location: Neck, bilat knees ?PAIN TYPE: aching, sharp, and throbbing ? ? ?PRECAUTIONS: Fall, no driving, defibrillator  ? ?SUBJECTIVE: No falls since last visit but plenty of trips while using cane in home.  ? ?  ?TODAY'S TREATMENT:  ?There Ex ?SciFit level 3 for 6 minutes using BUE/BLEs for neural priming for reciprocal movement, BLE strength and dynamic cardiovascular warmup. Cued pt to maintain steps/min >60 and for improved ROM on R side.  ? ?Ther Act  ?STG Assessment  ?  [facility] PT Assessment - 12/02/21 0956   ? ?  ? Transfers  ? Five time sit to stand comments  25.41s without UE support   ?  ? Ambulation/Gait  ? Gait velocity 32.8' over 21.09s = 1.55 ft/s with rollator   ? ?  ?  ? ?  ? ?mCTSIB condition 4: 30.34s w/S*  ? ?  ?  ?PATIENT EDUCATION: ?Education details: Using RW in home and starting walking program 66mins/day w/rollator for improved stamina and global conditioning  ?Person educated: Patient ?Education method: Explanation, Handout  ?Education comprehension: verbalized/demonstrated understanding ?  ?  ?HOME EXERCISE PROGRAM: ?[id number], Seated PWR moves   ?  ?  ?GOALS: ?Goals reviewed with patient? Yes ?  ?SHORT TERM GOALS: Target date: 12/01/2021 ?  ?Pt will be independent with initial HEP with family supervision in order to build upon functional gains made in therapy. ?  ?Baseline: ?Goal status: MET ?  ?2.  Pt will improve 5x sit<>stand to less than or equal to 33 sec without UE support to  demonstrate improved functional strength and transfer efficiency.  ?  ?Baseline:  39.65 without UE support; 25.41s without UE support  ?Goal status: MET ?  ?3.  Pt will improve gait speed with rollator to at least 1.3 ft/sec

## 2021-12-02 NOTE — Therapy (Signed)
OUTPATIENT OCCUPATIONAL THERAPY TREATMENT NOTE ? ? ?Patient Name: Veronica Shaw ?MRN: [id number] ?DOB:1951/01/04, 71 y.o., female ?Today's Date: 12/02/2021 ? ?PCP: Dr. Susa Simmonds  ?REFERRING PROVIDER: Dr. Wells Guiles Tat ? ?END OF SESSION:  ? OT End of Session - 12/02/21 1047   ? ? Visit Number 2   ? Number of Visits 25   ? Date for OT Re-Evaluation 02/23/22   ? Authorization Type Medicare   ? Authorization Time Period POC written for 12 weeks, may d/c after 8 weeks   ? Authorization - Visit Number 2   ? Authorization - Number of Visits 10   ? Progress Note Due on Visit 10   ? OT Start Time 1018   ? OT Stop Time 1058   ? OT Time Calculation (min) 40 min   ? Behavior During Therapy [facility] for tasks assessed/performed   ? ?  ?  ? ?  ? ? ?Past Medical History:  ?Diagnosis Date  ? Acute on chronic systolic CHF (congestive heart failure), NYHA class 4 ([place]) 10/19/2020  ? Asthma   ? Back pain 09/18/2019  ? Cervical disc disorder with radiculopathy of cervical region 09/24/2018  ? Chronic systolic CHF (congestive heart failure) ([place])   ? a. cMRI 4/15: EF 34% and findings - c/w NICM, normal RV size and function (RVEF 61%), Mild MR // b. Echo 2/15:  EF 30-35%, diff HK, ant-sept AK, Gr 2 DD, mild MR, trivial TR  //  c. Echo 5/17: EF 20-25%, severe diffuse HK, marked systolic dyssynchrony, grade 1 diastolic dysfunction, mild MR  //  d. RHC 5/17: Fick CO 2.9, RVSP 19, PASP 15, PW mean 2, low filing pressures and preserved CO   ? Cognitive impairment 10/19/2017  ? MOCA was administered with a score of 23/30  ? Diabetes mellitus   ? Diabetic peripheral neuropathy ([place]) 12/09/2011  ? Dyspnea 09/19/2012  ? Flu 10/17/2017  ? Gastritis   ? History of echocardiogram   ? Echo 6/18: EF 30-35, diffuse HK, grade 1 diastolic dysfunction, trivial MR, mild LAE, mild TR, no pericardial effusion  ? History of nuclear stress test   ? Myoview 5/18: EF 49, no ischemia, inferoseptal defect c/w LBBB artifact (intermediate risk due to EF < 50).  ? HTN (hypertension)    ? Hyperlipidemia   ? Hyperlipidemia associated with type 2 diabetes mellitus ([place]) 03/05/2019  ? Hypertension associated with diabetes ([place]) 09/28/2006  ? Qualifier: Diagnosis of  By: [name]    ? Major depression 03/31/2013  ? Melena 08/21/2020  ? NICM (nonischemic cardiomyopathy) ([place])   ? a. Nuclear 5/13: Normal stress nuclear study. LV Ejection Fraction: 58%  //  b. LHC 10/14: Minor luminal irregularity in prox LAD, EF 35%   ? Parkinson disease ([place])   ? Plantar fasciitis   ? Plantar fasciitis, bilateral 06/15/2012  ? Rosacea 05/29/2009  ? Qualifier: Diagnosis of  By: [name] MD, Rodman Key    ? Sensorineural hearing loss (SNHL), bilateral 01/04/2017  ? Sleep apnea   ? was retested and no longer had it and so d/c CPAP  ? Syncope   ? Thoracic or lumbosacral neuritis or radiculitis, unspecified 07/03/2013  ? Tinnitus, bilateral 01/04/2017  ? Type II diabetes mellitus with complication ([place]) 0/34/9179  ? Diabetic eye exam done by [facility] Ophthalmology: Dr. Prudencio Burly on 11/08/13: no diabetic retinopathy. Repeat in 1 year    ? Urticaria   ? ?Past Surgical History:  ?Procedure Laterality Date  ? BREAST EXCISIONAL BIOPSY Left 1970  ?  benign cyst  ? BREAST SURGERY Left   ? BUNIONECTOMY    ? CARDIAC CATHETERIZATION N/A 12/16/2015  ? Procedure: Right Heart Cath;  Surgeon: Sherren Mocha, MD;  Location: [facility];  Service: Cardiovascular;  Laterality: N/A;  ? CHOLECYSTECTOMY    ? eye lid surgery Bilateral 05/09/2019  ? IMPLANTABLE CARDIOVERTER DEFIBRILLATOR IMPLANT  11-25-13  ? MDT dual chamber ICD implanted by [name] for primary prevention  ? IMPLANTABLE CARDIOVERTER DEFIBRILLATOR IMPLANT N/A 11/25/2013  ? Procedure: IMPLANTABLE CARDIOVERTER DEFIBRILLATOR IMPLANT;  Surgeon: [name], MD;  Location: [facility] CATH LAB;  Service: Cardiovascular;  Laterality: N/A;  ? LEFT AND RIGHT HEART CATHETERIZATION WITH CORONARY ANGIOGRAM N/A 05/31/2013  ? Procedure: LEFT AND RIGHT HEART CATHETERIZATION WITH CORONARY ANGIOGRAM;   Surgeon: [name], MD;  Location: [facility] CATH LAB;  Service: Cardiovascular;  Laterality: N/A;  ? TONSILLECTOMY    ? TUBAL LIGATION    ? ?Patient Active Problem List  ? Diagnosis Date Noted  ? Type 2 diabetes mellitus with diabetic polyneuropathy, without long-term current use of insulin ([place]) 09/06/2021  ? Type 2 diabetes mellitus with stage 3a chronic kidney disease, with long-term current use of insulin ([place]) 09/06/2021  ? Dyslipidemia 09/06/2021  ? 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 Orthostatic hypotension 07/27/2021  ? Mood changes 07/27/2021  ? Weight loss 05/07/2021  ? Acute on chronic systolic CHF (congestive heart failure), NYHA class 4 ([place]) 10/19/2020  ? Chronic HFrEF (heart failure with reduced ejection fraction) ([place])   ? Cervical dystonia 10/07/2020  ? Advanced care planning/counseling discussion 08/21/2020  ? Dementia without behavioral disturbance ([place]) 08/21/2020  ? Vision impairment, Right Eye 08/21/2020  ? Chronic kidney disease, stage 3a ([place]) 08/18/2020  ? Chronic rhinitis 12/18/2019  ? Chronic respiratory failure with hypoxia ([place]) 12/18/2019  ? Hyperlipidemia associated with type 2 diabetes mellitus ([place]) 03/05/2019  ? Cervical radiculopathy 10/24/2018  ? Falls frequently   ? Asthma 08/03/2017  ? Osteopenia 07/10/2015  ? Mild persistent asthma in adult without complication 66/12/3014  ? NICM (nonischemic cardiomyopathy) ([place]) 04/23/2014  ? Arthritis or polyarthritis, rheumatoid ([place]) 03/12/2014  ? ICD (implantable cardioverter-defibrillator), dual, in situ 03/06/2014  ? Heart Failure with Recovered Ejection Fraction, G2DD 05/31/2013  ? Parkinson disease ([place]) 05/21/2013  ? Vertigo 03/03/2013  ? Gastroesophageal reflux disease without esophagitis 09/27/2012  ? Trigger point with neck pain 04/12/2012  ? 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  ? Diabetic peripheral neuropathy ([place]) 12/09/2011  ? Chronic urticaria 05/27/2011  ? Sinus tachycardia ([place]) 05/27/2011  ?  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 05/20/2011  ? Type II diabetes mellitus with complication ([place]) 08/09/3233  ? Hypertension associated with diabetes ([place]) 09/28/2006  ? ? ?ONSET DATE: 11/26/21  ? ?REFERRING DIAG: Parkinson's Disease ? ?THERAPY DIAG:  ?Other symptoms and signs involving the nervous system ? ?Other lack of coordination ? ?Other disturbances of skin sensation ? ?Stiffness of right shoulder, not elsewhere classified ? ?Stiffness of left elbow, not elsewhere classified ? ?Unsteadiness on feet ? ?Other abnormalities of gait and mobility ? ? ?PERTINENT HISTORY: Patient is a 71 year old female referred to [facility] for PD.  Pt received PT in 2021 but no OT.  ? PMH is significant for:  PD, chronic systolic CHF, diabetes, HTN, HLD, mild persistent asthma, cervical dystonia, chronic kidney disease stage III, peripheral neuropathy, pacemaker / ICD ? ?PRECAUTIONS: impaired sensation both hands due to neuropathy, pacemaker/ICD, fall risk  ? ?SUBJECTIVE: Pt reports generalized aching  ? ?  PAIN:  ?Are you having pain? Yes: NPRS scale: 7/10 ?Pain location: generalized  ?Pain description: aching ?Aggravating factors: malpositioning ?Relieving factors: repositioning ? ? ? ? ?OBJECTIVE:  ? ?TODAY'S TREATMENT: ?Therapist checked PPT#4, and 3 button unbutton and set goals. ?Gentle passive stretch to thumb and thenar eminence in abduction followed by pt performing active radial abduction/ adduction ?Initial HEP issued see below ? ?PATIENT EDUCATION: ?Education details: PWR! Hands basic 4, beginning coordination HEP ?Person educated: Patient ?Education method: Explanation, demonstration, verbal cues ?Education comprehension: verbalized understanding, returned demonstration ?  ?  ?HOME EXERCISE PROGRAM: ?N/A ?  ?ASSESSMENT: ? Pt is progressing towards goals.  She demonstrates understanding of initial HEP. ?  ?CLINICAL IMPRESSION: ?PERFORMANCE DEFICITS in functional skills including ADLs, IADLs, coordination, dexterity, sensation, tone,  ROM, strength, flexibility, FMC, GMC, mobility, balance, decreased knowledge of precautions, decreased knowledge of use of DME, vision, and UE functional use, cognitive skills including attention, memory,

## 2021-12-02 NOTE — Therapy (Deleted)
?OUTPATIENT OCCUPATIONAL THERAPY TREATMENT NOTE ? ? ?Patient Name: Veronica Shaw ?MRN: [id number] ?DOB:[birth year], 71 y.o., female ?Today's Date: 12/02/2021 ? ?PCP: Dr. Susa Simmonds  ?REFERRING PROVIDER: Dr. Wells Guiles Tat ? ?END OF SESSION:  ? OT End of Session - 12/02/21 1047   ? ? Visit Number 2   ? Number of Visits 25   ? Date for OT Re-Evaluation 02/23/22   ? Authorization Type Medicare   ? Authorization Time Period POC written for 12 weeks, may d/c after 8 weeks   ? Authorization - Visit Number 2   ? Authorization - Number of Visits 10   ? Progress Note Due on Visit 10   ? OT Start Time 1018   ? OT Stop Time 1058   ? OT Time Calculation (min) 40 min   ? Behavior During Therapy [facility] for tasks assessed/performed   ? ?  ?  ? ?  ? ? ?Past Medical History:  ?Diagnosis Date  ? Acute on chronic systolic CHF (congestive heart failure), NYHA class 4 ([place]) 10/19/2020  ? Asthma   ? Back pain 09/18/2019  ? Cervical disc disorder with radiculopathy of cervical region 09/24/2018  ? Chronic systolic CHF (congestive heart failure) ([place])   ? a. cMRI 4/15: EF 34% and findings - c/w NICM, normal RV size and function (RVEF 61%), Mild MR // b. Echo 2/15:  EF 30-35%, diff HK, ant-sept AK, Gr 2 DD, mild MR, trivial TR  //  c. Echo 5/17: EF 20-25%, severe diffuse HK, marked systolic dyssynchrony, grade 1 diastolic dysfunction, mild MR  //  d. RHC 5/17: Fick CO 2.9, RVSP 19, PASP 15, PW mean 2, low filing pressures and preserved CO   ? Cognitive impairment 10/19/2017  ? MOCA was administered with a score of 23/30  ? Diabetes mellitus   ? Diabetic peripheral neuropathy ([place]) 12/09/2011  ? Dyspnea 09/19/2012  ? Flu 10/17/2017  ? Gastritis   ? History of echocardiogram   ? Echo 6/18: EF 30-35, diffuse HK, grade 1 diastolic dysfunction, trivial MR, mild LAE, mild TR, no pericardial effusion  ? History of nuclear stress test   ? Myoview 5/18: EF 49, no ischemia, inferoseptal defect c/w LBBB artifact (intermediate risk due to EF < 50).  ? HTN  (hypertension)   ? Hyperlipidemia   ? Hyperlipidemia associated with type 2 diabetes mellitus ([place]) 03/05/2019  ? Hypertension associated with diabetes ([place]) 09/28/2006  ? Qualifier: Diagnosis of  By: [name]    ? Major depression 03/31/2013  ? Melena 08/21/2020  ? NICM (nonischemic cardiomyopathy) ([place])   ? a. Nuclear 5/13: Normal stress nuclear study. LV Ejection Fraction: 58%  //  b. LHC 10/14: Minor luminal irregularity in prox LAD, EF 35%   ? Parkinson disease ([place])   ? Plantar fasciitis   ? Plantar fasciitis, bilateral 06/15/2012  ? Rosacea 05/29/2009  ? Qualifier: Diagnosis of  By: [name] MD, Rodman Key    ? Sensorineural hearing loss (SNHL), bilateral 01/04/2017  ? Sleep apnea   ? was retested and no longer had it and so d/c CPAP  ? Syncope   ? Thoracic or lumbosacral neuritis or radiculitis, unspecified 07/03/2013  ? Tinnitus, bilateral 01/04/2017  ? Type II diabetes mellitus with complication ([place]) 6/62/9476  ? Diabetic eye exam done by [facility]: Dr. Prudencio Burly on 11/08/13: no diabetic retinopathy. Repeat in 1 year    ? Urticaria   ? ?Past Surgical History:  ?Procedure Laterality Date  ? BREAST EXCISIONAL BIOPSY Left 1970  ?  benign cyst  ? BREAST SURGERY Left   ? BUNIONECTOMY    ? CARDIAC CATHETERIZATION N/A 12/16/2015  ? Procedure: Right Heart Cath;  Surgeon: Sherren Mocha, MD;  Location: [place] CV LAB;  Service: Cardiovascular;  Laterality: N/A;  ? CHOLECYSTECTOMY    ? eye lid surgery Bilateral 05/09/2019  ? IMPLANTABLE CARDIOVERTER DEFIBRILLATOR IMPLANT  11-25-13  ? MDT dual chamber ICD implanted by [name] for primary prevention  ? IMPLANTABLE CARDIOVERTER DEFIBRILLATOR IMPLANT N/A 11/25/2013  ? Procedure: IMPLANTABLE CARDIOVERTER DEFIBRILLATOR IMPLANT;  Surgeon: [name], MD;  Location[facility] LAB;  Service: Cardiovascular;  Laterality: N/A;  ? LEFT AND RIGHT HEART CATHETERIZATION WITH CORONARY ANGIOGRAM N/A 05/31/2013  ? Procedure: LEFT AND RIGHT HEART CATHETERIZATION WITH CORONARY  ANGIOGRAM;  Surgeon: [name], MD;  Location: [facility] CATH LAB;  Service: Cardiovascular;  Laterality: N/A;  ? TONSILLECTOMY    ? TUBAL LIGATION    ? ?Patient Active Problem List  ? Diagnosis Date Noted  ? Type 2 diabetes mellitus with diabetic polyneuropathy, without long-term current use of insulin ([place]) 09/06/2021  ? Type 2 diabetes mellitus with stage 3a chronic kidney disease, with long-term current use of insulin ([place]) 09/06/2021  ? Dyslipidemia 09/06/2021  ? 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 Orthostatic hypotension 07/27/2021  ? Mood changes 07/27/2021  ? Weight loss 05/07/2021  ? Acute on chronic systolic CHF (congestive heart failure), NYHA class 4 ([place]) 10/19/2020  ? Chronic HFrEF (heart failure with reduced ejection fraction) ([place])   ? Cervical dystonia 10/07/2020  ? Advanced care planning/counseling discussion 08/21/2020  ? Dementia without behavioral disturbance ([place]) 08/21/2020  ? Vision impairment, Right Eye 08/21/2020  ? Chronic kidney disease, stage 3a ([place]) 08/18/2020  ? Chronic rhinitis 12/18/2019  ? Chronic respiratory failure with hypoxia ([place]) 12/18/2019  ? Hyperlipidemia associated with type 2 diabetes mellitus ([place]) 03/05/2019  ? Cervical radiculopathy 10/24/2018  ? Falls frequently   ? Asthma 08/03/2017  ? Osteopenia 07/10/2015  ? Mild persistent asthma in adult without complication 97/67/3419  ? NICM (nonischemic cardiomyopathy) ([place]) 04/23/2014  ? Arthritis or polyarthritis, rheumatoid ([place]) 03/12/2014  ? ICD (implantable cardioverter-defibrillator), dual, in situ 03/06/2014  ? Heart Failure with Recovered Ejection Fraction, G2DD 05/31/2013  ? Parkinson disease ([place]) 05/21/2013  ? Vertigo 03/03/2013  ? Gastroesophageal reflux disease without esophagitis 09/27/2012  ? Trigger point with neck pain 04/12/2012  ? 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  ? Diabetic peripheral neuropathy ([place]) 12/09/2011  ? Chronic urticaria 05/27/2011  ? Sinus tachycardia ([place])  05/27/2011  ? 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 05/20/2011  ? Type II diabetes mellitus with complication ([place]) 37/90/2409  ? Hypertension associated with diabetes ([place]) 09/28/2006  ? ? ?ONSET DATE: 11/26/21  ? ?REFERRING DIAG: Parkinson's Disease ? ?THERAPY DIAG:  ?Other symptoms and signs involving the nervous system ? ?Other lack of coordination ? ?Other disturbances of skin sensation ? ?Stiffness of right shoulder, not elsewhere classified ? ?Stiffness of left elbow, not elsewhere classified ? ?Unsteadiness on feet ? ?Other abnormalities of gait and mobility ? ? ?PERTINENT HISTORY: Patient is a 71 year old female referred to Neuro [facility] for PD.  Pt received PT in 2021 but no OT.  ? PMH is significant for:  PD, chronic systolic CHF, diabetes, HTN, HLD, mild persistent asthma, cervical dystonia, chronic kidney disease stage III, peripheral neuropathy, pacemaker / ICD ? ?PRECAUTIONS: impaired sensation both hands due to neuropathy, pacemaker/ICD, fall risk  ? ?SUBJECTIVE: Pt reports generalized aching  ? ?  PAIN:  ?Are you having pain? Yes: NPRS scale: 7/10 ?Pain location: generalized  ?Pain description: aching ?Aggravating factors: malpositioning ?Relieving factors: repositioning ? ? ? ? ?OBJECTIVE:  ? ?TODAY'S TREATMENT: ?Therapist checked PPT#4, and 3 button unbutton and set goals. ?Gentle passive stretch to thumb and thenar eminence in abduction followed by pt performing active radial abduction/ adduction ?Initial HEP issued see below ? ?PATIENT EDUCATION: ?Education details: PWR! Hands basic 4, beginning coordination HEP ?Person educated: Patient ?Education method: Explanation, demonstration, verbal cues ?Education comprehension: verbalized understanding, returned demonstration ?  ?  ?HOME EXERCISE PROGRAM: ?N/A ?  ?ASSESSMENT: ? Pt is progressing towards goals.  She demonstrates understanding of initial HEP. ?  ?CLINICAL IMPRESSION: ?PERFORMANCE DEFICITS in functional skills including ADLs, IADLs, coordination, dexterity,  sensation, tone, ROM, strength, flexibility, FMC, GMC, mobility, balance, decreased knowledge of precautions, decreased knowledge of use of DME, vision, and UE functional use, cognitive skills including attention, memory

## 2021-12-02 NOTE — Patient Instructions (Signed)
PWR! Hands ? ?With arms stretched out in front of you (elbows straight), perform the following: ?PWR! Rock: Move wrists up and down BIG ?PWR! Twist: Twist palms up and down BIG ? ?Then, start with elbows bent and hands closed. ?PWR! Step: Touch index finger to thumb while keeping other fingers straight. Flick fingers out BIG (thumb out/straighten fingers). Repeat with other fingers. (Step your thumb to each finger). ?PWR! Hands: Push hands out BIG. Elbows straight, wrists up, fingers open and spread apart BIG. (Can also perform by pushing down on table, chair, knees. Push above head, out to the side, behind you, in front of you.) ? ? ?** Make each movement big and deliberate so that you feel the movement. ? ?Perform at least 10 repetitions 1x/day, but perform PWR! hands throughout the day when you are having trouble using your hands (picking up/manipulating small objects, writing, eating, typing, sewing, buttoning, etc.). ? ? ? ?Coordination Exercises ? ?Perform the following exercises for 20 minutes 1 times per day. Perform with both hand(s). Perform using big movements. ? ?Flipping Cards: Place deck of cards on the table. Flip cards over by opening your hand big to grasp and then turn your palm up big. ?Deal cards: Hold 1/2 or whole deck in your hand. Use thumb to push card off top of deck with one big push. ?

## 2021-12-06 ENCOUNTER — Ambulatory Visit (INDEPENDENT_AMBULATORY_CARE_PROVIDER_SITE_OTHER): Payer: Medicare Other

## 2021-12-06 DIAGNOSIS — I5022 Chronic systolic (congestive) heart failure: Secondary | ICD-10-CM

## 2021-12-06 DIAGNOSIS — Z20822 Contact with and (suspected) exposure to covid-19: Secondary | ICD-10-CM | POA: Diagnosis not present

## 2021-12-06 DIAGNOSIS — Z95810 Presence of automatic (implantable) cardiac defibrillator: Secondary | ICD-10-CM | POA: Diagnosis not present

## 2021-12-06 DIAGNOSIS — H04123 Dry eye syndrome of bilateral lacrimal glands: Secondary | ICD-10-CM | POA: Diagnosis not present

## 2021-12-07 ENCOUNTER — Ambulatory Visit: Payer: Medicare Other | Admitting: Physical Therapy

## 2021-12-07 ENCOUNTER — Ambulatory Visit: Payer: Medicare Other | Admitting: Occupational Therapy

## 2021-12-07 DIAGNOSIS — N952 Postmenopausal atrophic vaginitis: Secondary | ICD-10-CM | POA: Diagnosis not present

## 2021-12-07 DIAGNOSIS — R300 Dysuria: Secondary | ICD-10-CM | POA: Diagnosis not present

## 2021-12-09 ENCOUNTER — Ambulatory Visit: Payer: Medicare Other | Admitting: Physical Therapy

## 2021-12-09 ENCOUNTER — Encounter: Payer: Self-pay | Admitting: Physical Therapy

## 2021-12-09 DIAGNOSIS — R208 Other disturbances of skin sensation: Secondary | ICD-10-CM | POA: Diagnosis not present

## 2021-12-09 DIAGNOSIS — R2689 Other abnormalities of gait and mobility: Secondary | ICD-10-CM

## 2021-12-09 DIAGNOSIS — Z9181 History of falling: Secondary | ICD-10-CM | POA: Diagnosis not present

## 2021-12-09 DIAGNOSIS — R2681 Unsteadiness on feet: Secondary | ICD-10-CM | POA: Diagnosis not present

## 2021-12-09 DIAGNOSIS — R29818 Other symptoms and signs involving the nervous system: Secondary | ICD-10-CM

## 2021-12-09 DIAGNOSIS — R278 Other lack of coordination: Secondary | ICD-10-CM | POA: Diagnosis not present

## 2021-12-09 NOTE — Progress Notes (Signed)
EPIC Encounter for ICM Monitoring ? ?Patient Name: Veronica Shaw is a 71 y.o. female ?Date: 12/09/2021 ?Primary Care Physican: Lyndee Hensen, DO ?Primary Cardiologist: Cooper/Weaver PA ?Electrophysiologist: Lovena Le ?08/20/2021 Office Weight: 129 lbs    ?11/02/2021 Weight: 121 lbs at home ?12/09/2021 Weight: 121 lbs ?                                  ?  ?Spoke with patient and heart failure questions reviewed.  Pt asymptomatic for fluid accumulation.  Reports feeling well at this time and voices no complaints.  She is going to rehab. ?  ?OptiVol Thoracic impedance suggesting normal fluid levels.  ?  ?Prescribed: Furosemide 20 mg Take 1 tablet (20 mg total) by mouth daily.  Confirmed 4/4 she is taking 20 mg daily. ?  ?Labs: ?08/20/2021 Creatinine 1.11, BUN 21, Potassium 4.3, Sodium 140, GFR 53 ?04/20/2021 Creatinine 1.24, BUN 13, Potassium 4.6, Sodium 140, GFR 46 ?01/22/2021 Creatinine 1.50, BUN 29, Potassium 4.6, Sodium 144 ?01/18/2021 Creatinine 1.39, BUN 23, Potassium 4.6, Sodium 137, GFR 41  ?A complete set of results can be found in Results Review. ?  ?Recommendations:   No changes and encouraged to call if experiencing any fluid symptoms. ?  ?Follow-up plan: ICM clinic phone appointment on 01/10/2022.   91 day device clinic remote transmission 02/24/2022.   ?  ?EP/Cardiology Office Visits:   Recalls 01/17/2022 for [name].  12/22/2021 with Dr Burt Knack. ?  ?Copy of ICM check sent to Dr. Lovena Le.  ? ?3 month ICM trend: 12/06/2021. ? ? ? ?12-14 Month ICM trend:  ? ? ? ?[name], RN ?12/09/2021 ?8:54 AM ? ?

## 2021-12-09 NOTE — Therapy (Signed)
?OUTPATIENT PHYSICAL THERAPY TREATMENT NOTE ? ? ?Patient Name: Veronica Shaw ?MRN: [id number] ?DOB:1951/05/31, 71 y.o., female ?Today's Date: 12/09/2021 ? ?PCP: Lyndee Hensen, DO ?REFERRING PROVIDER: Ludwig Clarks, DO ? ?END OF SESSION:  ? PT End of Session - 12/09/21 0934   ? ? Visit Number 5   ? Number of Visits 17   ? Date for PT Re-Evaluation 02/01/22   ? Authorization Type Medicare   ? Progress Note Due on Visit 10   ? PT Start Time 6812   ? PT Stop Time 1014   ? PT Time Calculation (min) 42 min   ? Equipment Utilized During Treatment Gait belt   ? Activity Tolerance Patient tolerated treatment well   ? Behavior During Therapy [facility] for tasks assessed/performed   ? ?  ?  ? ?  ? ? ? ? ?Past Medical History:  ?Diagnosis Date  ? Acute on chronic systolic CHF (congestive heart failure), NYHA class 4 ([place]) 10/19/2020  ? Asthma   ? Back pain 09/18/2019  ? Cervical disc disorder with radiculopathy of cervical region 09/24/2018  ? Chronic systolic CHF (congestive heart failure) ([place])   ? a. cMRI 4/15: EF 34% and findings - c/w NICM, normal RV size and function (RVEF 61%), Mild MR // b. Echo 2/15:  EF 30-35%, diff HK, ant-sept AK, Gr 2 DD, mild MR, trivial TR  //  c. Echo 5/17: EF 20-25%, severe diffuse HK, marked systolic dyssynchrony, grade 1 diastolic dysfunction, mild MR  //  d. RHC 5/17: Fick CO 2.9, RVSP 19, PASP 15, PW mean 2, low filing pressures and preserved CO   ? Cognitive impairment 10/19/2017  ? MOCA was administered with a score of 23/30  ? Diabetes mellitus   ? Diabetic peripheral neuropathy ([place]) 12/09/2011  ? Dyspnea 09/19/2012  ? Flu 10/17/2017  ? Gastritis   ? History of echocardiogram   ? Echo 6/18: EF 30-35, diffuse HK, grade 1 diastolic dysfunction, trivial MR, mild LAE, mild TR, no pericardial effusion  ? History of nuclear stress test   ? Myoview 5/18: EF 49, no ischemia, inferoseptal defect c/w LBBB artifact (intermediate risk due to EF < 50).  ? HTN (hypertension)   ? Hyperlipidemia   ?  Hyperlipidemia associated with type 2 diabetes mellitus ([place]) 03/05/2019  ? Hypertension associated with diabetes ([place]) 09/28/2006  ? Qualifier: Diagnosis of  By: [name]    ? Major depression 03/31/2013  ? Melena 08/21/2020  ? NICM (nonischemic cardiomyopathy) ([place])   ? a. Nuclear 5/13: Normal stress nuclear study. LV Ejection Fraction: 58%  //  b. LHC 10/14: Minor luminal irregularity in prox LAD, EF 35%   ? Parkinson disease ([place])   ? Plantar fasciitis   ? Plantar fasciitis, bilateral 06/15/2012  ? Rosacea 05/29/2009  ? Qualifier: Diagnosis of  By: [name] MD, Rodman Key    ? Sensorineural hearing loss (SNHL), bilateral 01/04/2017  ? Sleep apnea   ? was retested and no longer had it and so d/c CPAP  ? Syncope   ? Thoracic or lumbosacral neuritis or radiculitis, unspecified 07/03/2013  ? Tinnitus, bilateral 01/04/2017  ? Type II diabetes mellitus with complication ([place]) 7/51/7001  ? Diabetic eye exam done by [facility] Ophthalmology: Dr. Prudencio Burly on 11/08/13: no diabetic retinopathy. Repeat in 1 year    ? Urticaria   ? ?Past Surgical History:  ?Procedure Laterality Date  ? BREAST EXCISIONAL BIOPSY Left 1970  ? benign cyst  ? BREAST SURGERY Left   ?  BUNIONECTOMY    ? CARDIAC CATHETERIZATION N/A 12/16/2015  ? Procedure: Right Heart Cath;  Surgeon: Sherren Mocha, MD;  Location: [place] CV LAB;  Service: Cardiovascular;  Laterality: N/A;  ? CHOLECYSTECTOMY    ? eye lid surgery Bilateral 05/09/2019  ? IMPLANTABLE CARDIOVERTER DEFIBRILLATOR IMPLANT  11-25-13  ? MDT dual chamber ICD implanted by [name] for primary prevention  ? IMPLANTABLE CARDIOVERTER DEFIBRILLATOR IMPLANT N/A 11/25/2013  ? Procedure: IMPLANTABLE CARDIOVERTER DEFIBRILLATOR IMPLANT;  Surgeon: [name], MD;  Location: [facility] CATH LAB;  Service: Cardiovascular;  Laterality: N/A;  ? LEFT AND RIGHT HEART CATHETERIZATION WITH CORONARY ANGIOGRAM N/A 05/31/2013  ? Procedure: LEFT AND RIGHT HEART CATHETERIZATION WITH CORONARY ANGIOGRAM;  Surgeon: [name],  MD;  Location: [facility] CATH LAB;  Service: Cardiovascular;  Laterality: N/A;  ? TONSILLECTOMY    ? TUBAL LIGATION    ? ?Patient Active Problem List  ? Diagnosis Date Noted  ? Type 2 diabetes mellitus with diabetic polyneuropathy, without long-term current use of insulin ([place]) 09/06/2021  ? Type 2 diabetes mellitus with stage 3a chronic kidney disease, with long-term current use of insulin ([place]) 09/06/2021  ? Dyslipidemia 09/06/2021  ? 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 Orthostatic hypotension 07/27/2021  ? Mood changes 07/27/2021  ? Weight loss 05/07/2021  ? Acute on chronic systolic CHF (congestive heart failure), NYHA class 4 ([place]) 10/19/2020  ? Chronic HFrEF (heart failure with reduced ejection fraction) ([place])   ? Cervical dystonia 10/07/2020  ? Advanced care planning/counseling discussion 08/21/2020  ? Dementia without behavioral disturbance ([place]) 08/21/2020  ? Vision impairment, Right Eye 08/21/2020  ? Chronic kidney disease, stage 3a ([place]) 08/18/2020  ? Chronic rhinitis 12/18/2019  ? Chronic respiratory failure with hypoxia ([place]) 12/18/2019  ? Hyperlipidemia associated with type 2 diabetes mellitus ([place]) 03/05/2019  ? Cervical radiculopathy 10/24/2018  ? Falls frequently   ? Asthma 08/03/2017  ? Osteopenia 07/10/2015  ? Mild persistent asthma in adult without complication 60/73/7106  ? NICM (nonischemic cardiomyopathy) ([place]) 04/23/2014  ? Arthritis or polyarthritis, rheumatoid ([place]) 03/12/2014  ? ICD (implantable cardioverter-defibrillator), dual, in situ 03/06/2014  ? Heart Failure with Recovered Ejection Fraction, G2DD 05/31/2013  ? Parkinson disease ([place]) 05/21/2013  ? Vertigo 03/03/2013  ? Gastroesophageal reflux disease without esophagitis 09/27/2012  ? Trigger point with neck pain 04/12/2012  ? 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  ? Diabetic peripheral neuropathy ([place]) 12/09/2011  ? Chronic urticaria 05/27/2011  ? Sinus tachycardia ([place]) 05/27/2011  ? 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  05/20/2011  ? Type II diabetes mellitus with complication ([place]) 26/94/8546  ? Hypertension associated with diabetes ([place]) 09/28/2006  ? ? ?REFERRING DIAG: G20 (ICD-10-CM) - Parkinson's disease ([place])  ? ?THERAPY DIAG:  ?Other symptoms and signs involving the nervous system ? ?Unsteadiness on feet ? ?Other abnormalities of gait and mobility ? ?PERTINENT HISTORY: PMH: PD, chronic systolic CHF, diabetes, HTN, HLD, mild persistent asthma, cervical dystonia, chronic kidney disease stage III, peripheral neuropathy.  ?  ?PAIN:  ?Are you having pain? Yes ?NPRS scale: 6/10 ?Pain location: Neck, bilat knees ?PAIN TYPE: aching, sharp, and throbbing ? ? ?PRECAUTIONS: Fall, no driving, defibrillator  ? ?SUBJECTIVE: Has been walking outside with the rollator and it has been going well. Did 30 minutes the other day and legs feel sore. No falls.  ? ?  ?TODAY'S TREATMENT:  ?Therapeutic Ex ?SciFit level 3 for 6 minutes using BUE/BLEs for reciprocal movement, endurance, BLE strength and dynamic cardiovascular warmup. Cued pt to maintain  steps/min >60 and for improved ROM on R side.  ? ? ? ?At end of session, performed x5 reps sit <> stands with focus on incr forward lean, "nose over toes" as pt performing initially without incr anterior weight shift and had BLE bracing against mat table.  ? ? ?GAIT: ?Gait pattern: step through pattern, decreased stance time- Right, decreased stride length, decreased ankle dorsiflexion- Right, shuffling, and poor foot clearance- Right ?Distance walked: 230' x 1 with cane, remainder of distance with rollator.  ?Assistive device utilized: Environmental consultant - 4 wheeled rollator, SPC with quad tip  ?Level of assistance: SBA, CGA at times with cane.  ? ?When using rollator, cued for incr foot clearance during gait. ? ?Practiced gait with using SPC w/ quad tip as pt has one at home and needs to use it at times during narrow spaces when pt can't bring her rollator. Pt holding cane in RUE, needs cues throughout for proper  sequencing, cane placement, and wider BOS. Pt getting off sequence at times and needs to stop and reset. Pt looking down at the ground throughout and reports dizziness after looking at the ground when using cane. Disc

## 2021-12-09 NOTE — Progress Notes (Signed)
Remote ICD transmission.   

## 2021-12-13 NOTE — Therapy (Incomplete)
OUTPATIENT OCCUPATIONAL THERAPY TREATMENT NOTE ? ? ?Patient Name: Veronica Shaw ?MRN: [id number] ?DOB:1950-08-27, 71 y.o., female ?Today's Date: 12/02/2021 ? ?PCP: Dr. Susa Simmonds  ?REFERRING PROVIDER: Dr. Wells Guiles Tat ? ?END OF SESSION:  ? ?'@[email or web address]'$ @ ? ? ? ?Past Medical History:  ?Diagnosis Date  ? Acute on chronic systolic CHF (congestive heart failure), NYHA class 4 ([place]) 10/19/2020  ? Asthma   ? Back pain 09/18/2019  ? Cervical disc disorder with radiculopathy of cervical region 09/24/2018  ? Chronic systolic CHF (congestive heart failure) ([place])   ? a. cMRI 4/15: EF 34% and findings - c/w NICM, normal RV size and function (RVEF 61%), Mild MR // b. Echo 2/15:  EF 30-35%, diff HK, ant-sept AK, Gr 2 DD, mild MR, trivial TR  //  c. Echo 5/17: EF 20-25%, severe diffuse HK, marked systolic dyssynchrony, grade 1 diastolic dysfunction, mild MR  //  d. RHC 5/17: Fick CO 2.9, RVSP 19, PASP 15, PW mean 2, low filing pressures and preserved CO   ? Cognitive impairment 10/19/2017  ? MOCA was administered with a score of 23/30  ? Diabetes mellitus   ? Diabetic peripheral neuropathy ([place]) 12/09/2011  ? Dyspnea 09/19/2012  ? Flu 10/17/2017  ? Gastritis   ? History of echocardiogram   ? Echo 6/18: EF 30-35, diffuse HK, grade 1 diastolic dysfunction, trivial MR, mild LAE, mild TR, no pericardial effusion  ? History of nuclear stress test   ? Myoview 5/18: EF 49, no ischemia, inferoseptal defect c/w LBBB artifact (intermediate risk due to EF < 50).  ? HTN (hypertension)   ? Hyperlipidemia   ? Hyperlipidemia associated with type 2 diabetes mellitus ([place]) 03/05/2019  ? Hypertension associated with diabetes ([place]) 09/28/2006  ? Qualifier: Diagnosis of  By: [name]    ? Major depression 03/31/2013  ? Melena 08/21/2020  ? NICM (nonischemic cardiomyopathy) ([place])   ? a. Nuclear 5/13: Normal stress nuclear study. LV Ejection Fraction: 58%  //  b. LHC 10/14: Minor luminal irregularity in prox LAD, EF 35%   ? Parkinson disease ([place])   ?  Plantar fasciitis   ? Plantar fasciitis, bilateral 06/15/2012  ? Rosacea 05/29/2009  ? Qualifier: Diagnosis of  By: [name] MD, Rodman Key    ? Sensorineural hearing loss (SNHL), bilateral 01/04/2017  ? Sleep apnea   ? was retested and no longer had it and so d/c CPAP  ? Syncope   ? Thoracic or lumbosacral neuritis or radiculitis, unspecified 07/03/2013  ? Tinnitus, bilateral 01/04/2017  ? Type II diabetes mellitus with complication ([place]) 7/68/1157  ? Diabetic eye exam done by [facility]: Dr. Prudencio Burly on 11/08/13: no diabetic retinopathy. Repeat in 1 year    ? Urticaria   ? ?Past Surgical History:  ?Procedure Laterality Date  ? BREAST EXCISIONAL BIOPSY Left 1970  ? benign cyst  ? BREAST SURGERY Left   ? BUNIONECTOMY    ? CARDIAC CATHETERIZATION N/A 12/16/2015  ? Procedure: Right Heart Cath;  Surgeon: Sherren Mocha, MD;  Location: [place] [facility];  Service: Cardiovascular;  Laterality: N/A;  ? CHOLECYSTECTOMY    ? eye lid surgery Bilateral 05/09/2019  ? IMPLANTABLE CARDIOVERTER DEFIBRILLATOR IMPLANT  11-25-13  ? MDT dual chamber ICD implanted by [name] for primary prevention  ? IMPLANTABLE CARDIOVERTER DEFIBRILLATOR IMPLANT N/A 11/25/2013  ? Procedure: IMPLANTABLE CARDIOVERTER DEFIBRILLATOR IMPLANT;  Surgeon: [name], MD;  Location: [facility] CATH LAB;  Service: Cardiovascular;  Laterality: N/A;  ? LEFT AND RIGHT HEART CATHETERIZATION WITH CORONARY  ANGIOGRAM N/A 05/31/2013  ? Procedure: LEFT AND RIGHT HEART CATHETERIZATION WITH CORONARY ANGIOGRAM;  Surgeon: [name], MD;  Location: [facility] CATH LAB;  Service: Cardiovascular;  Laterality: N/A;  ? TONSILLECTOMY    ? TUBAL LIGATION    ? ?Patient Active Problem List  ? Diagnosis Date Noted  ? Type 2 diabetes mellitus with diabetic polyneuropathy, without long-term current use of insulin ([place]) 09/06/2021  ? Type 2 diabetes mellitus with stage 3a chronic kidney disease, with long-term current use of insulin ([place]) 09/06/2021  ? Dyslipidemia 09/06/2021  ? 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 Orthostatic hypotension 07/27/2021  ? Mood changes 07/27/2021  ? Weight loss 05/07/2021  ? Acute on chronic systolic CHF (congestive heart failure), NYHA class 4 ([place]) 10/19/2020  ? Chronic HFrEF (heart failure with reduced ejection fraction) ([place])   ? Cervical dystonia 10/07/2020  ? Advanced care planning/counseling discussion 08/21/2020  ? Dementia without behavioral disturbance ([place]) 08/21/2020  ? Vision impairment, Right Eye 08/21/2020  ? Chronic kidney disease, stage 3a ([place]) 08/18/2020  ? Chronic rhinitis 12/18/2019  ? Chronic respiratory failure with hypoxia ([place]) 12/18/2019  ? Hyperlipidemia associated with type 2 diabetes mellitus ([place]) 03/05/2019  ? Cervical radiculopathy 10/24/2018  ? Falls frequently   ? Asthma 08/03/2017  ? Osteopenia 07/10/2015  ? Mild persistent asthma in adult without complication 20/94/7096  ? NICM (nonischemic cardiomyopathy) ([place]) 04/23/2014  ? Arthritis or polyarthritis, rheumatoid ([place]) 03/12/2014  ? ICD (implantable cardioverter-defibrillator), dual, in situ 03/06/2014  ? Heart Failure with Recovered Ejection Fraction, G2DD 05/31/2013  ? Parkinson disease ([place]) 05/21/2013  ? Vertigo 03/03/2013  ? Gastroesophageal reflux disease without esophagitis 09/27/2012  ? Trigger point with neck pain 04/12/2012  ? 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  ? Diabetic peripheral neuropathy ([place]) 12/09/2011  ? Chronic urticaria 05/27/2011  ? Sinus tachycardia ([place]) 05/27/2011  ? 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 05/20/2011  ? Type II diabetes mellitus with complication ([place]) 28/36/6294  ? Hypertension associated with diabetes ([place]) 09/28/2006  ? ? ?ONSET DATE: 11/26/21  ? ?REFERRING DIAG: Parkinson's Disease ? ?THERAPY DIAG:  ?Other symptoms and signs involving the nervous system ? ?Other lack of coordination ? ?Other disturbances of skin sensation ? ?Stiffness of right shoulder, not elsewhere classified ? ?Stiffness of left elbow, not elsewhere  classified ? ?Unsteadiness on feet ? ?Other abnormalities of gait and mobility ? ? ?PERTINENT HISTORY: Patient is a 71 year old female referred to Neuro OPOT for PD.  Pt received PT in 2021 but no OT.  ? PMH is significant for:  PD, chronic systolic CHF, diabetes, HTN, HLD, mild persistent asthma, cervical dystonia, chronic kidney disease stage III, peripheral neuropathy, pacemaker / ICD ? ?PRECAUTIONS: impaired sensation both hands due to neuropathy, pacemaker/ICD, fall risk  ? ?SUBJECTIVE: *** Pt reports generalized aching  ? ?PAIN:  ?Are you having pain? Yes: NPRS scale: *** 7/10 ?Pain location: generalized  ?Pain description: aching ?Aggravating factors: malpositioning ?Relieving factors: repositioning ? ? ? ? ?OBJECTIVE:  ? ?TODAY'S TREATMENT: ?***  ?Therapist checked PPT#4, and 3 button unbutton and set goals. ?Gentle passive stretch to thumb and thenar eminence in abduction followed by pt performing active radial abduction/ adduction ?Initial HEP issued see below ? ?PATIENT EDUCATION: ?Education details: *** PWR! Hands basic 4, beginning coordination HEP ?Person educated: Patient ?Education method: Explanation, demonstration, verbal cues ?Education comprehension: verbalized understanding, returned demonstration ?  ?  ?HOME EXERCISE PROGRAM: ?12/02/21:  PWR! Hands basic 4, beginning coordination HEP ?  ? ?  ?  CLINICAL IMPRESSION:  *** ? ?PERFORMANCE DEFICITS in functional skills including ADLs, IADLs, coordination, dexterity, sensation, tone, ROM, strength, flexibility, FMC, GMC, mobility, balance, decreased knowledge of precautions, decreased knowledge of use of DME, vision, and UE functional use, cognitive skills including attention, memory, problem solving, safety awareness, thought, and understand, and psychosocial skills including coping strategies, environmental adaptation, and routines and behaviors.  ?  ?IMPAIRMENTS are limiting patient from ADLs, IADLs, rest and sleep, play, leisure, and social  participation.  ?  ?COMORBIDITIES may have co-morbidities  that affects occupational performance. Patient will benefit from skilled OT to address above impairments and improve overall function. ?  ?MODIFICATION OR ASSISTANCE T

## 2021-12-14 ENCOUNTER — Encounter: Payer: Self-pay | Admitting: Physical Therapy

## 2021-12-14 ENCOUNTER — Ambulatory Visit: Payer: Medicare Other | Admitting: Physical Therapy

## 2021-12-14 ENCOUNTER — Ambulatory Visit: Payer: Medicare Other | Admitting: Occupational Therapy

## 2021-12-14 DIAGNOSIS — R278 Other lack of coordination: Secondary | ICD-10-CM | POA: Diagnosis not present

## 2021-12-14 DIAGNOSIS — R2689 Other abnormalities of gait and mobility: Secondary | ICD-10-CM

## 2021-12-14 DIAGNOSIS — R29818 Other symptoms and signs involving the nervous system: Secondary | ICD-10-CM

## 2021-12-14 DIAGNOSIS — R2681 Unsteadiness on feet: Secondary | ICD-10-CM | POA: Diagnosis not present

## 2021-12-14 DIAGNOSIS — M25611 Stiffness of right shoulder, not elsewhere classified: Secondary | ICD-10-CM

## 2021-12-14 DIAGNOSIS — M25612 Stiffness of left shoulder, not elsewhere classified: Secondary | ICD-10-CM

## 2021-12-14 DIAGNOSIS — R208 Other disturbances of skin sensation: Secondary | ICD-10-CM

## 2021-12-14 DIAGNOSIS — M25622 Stiffness of left elbow, not elsewhere classified: Secondary | ICD-10-CM

## 2021-12-14 DIAGNOSIS — Z9181 History of falling: Secondary | ICD-10-CM | POA: Diagnosis not present

## 2021-12-14 NOTE — Therapy (Signed)
OUTPATIENT OCCUPATIONAL THERAPY TREATMENT NOTE   Patient Name: Veronica Shaw MRN: [id number] DOB:[birth year], 71 y.o., female Today's Date: 12/16/2021  PCP: Dr. Susa Simmonds REFERRING PROVIDER: Dr. Carles Collet   END OF SESSION:   OT End of Session - 12/16/21 1022     Visit Number 4    Number of Visits 25    Date for OT Re-Evaluation 02/23/22    Authorization Type Medicare    Authorization Time Period POC written for 12 weeks, may d/c after 8 weeks    Authorization - Visit Number 4    Authorization - Number of Visits 10    Progress Note Due on Visit 10    OT Start Time 1020    OT Stop Time 1100    OT Time Calculation (min) 40 min    Activity Tolerance Patient tolerated treatment well    Behavior During Therapy WFL for tasks assessed/performed                      Past Medical History:  Diagnosis Date   Acute on chronic systolic CHF (congestive heart failure), NYHA class 4 ([place]) 10/19/2020   Asthma    Back pain 09/18/2019   Cervical disc disorder with radiculopathy of cervical region 9/98/3382   Chronic systolic CHF (congestive heart failure) ([place])    a. cMRI 4/15: EF 34% and findings - c/w NICM, normal RV size and function (RVEF 61%), Mild MR // b. Echo 2/15:  EF 30-35%, diff HK, ant-sept AK, Gr 2 DD, mild MR, trivial TR  //  c. Echo 5/17: EF 20-25%, severe diffuse HK, marked systolic dyssynchrony, grade 1 diastolic dysfunction, mild MR  //  d. [place] 5/17: Fick CO 2.9, RVSP 19, PASP 15, PW mean 2, low filing pressures and preserved CO    Cognitive impairment 10/19/2017   MOCA was administered with a score of 23/30   Diabetes mellitus    Diabetic peripheral neuropathy ([place]) 12/09/2011   Dyspnea 09/19/2012   Flu 10/17/2017   Gastritis    History of echocardiogram    Echo 6/18: EF 30-35, diffuse HK, grade 1 diastolic dysfunction, trivial MR, mild LAE, mild TR, no pericardial effusion   History of nuclear stress test    Myoview 5/18: EF 49, no ischemia, inferoseptal defect c/w  LBBB artifact (intermediate risk due to EF < 50).   HTN (hypertension)    Hyperlipidemia    Hyperlipidemia associated with type 2 diabetes mellitus ([place]) 03/05/2019   Hypertension associated with diabetes ([place]) 09/28/2006   Qualifier: Diagnosis of  By: [name]     Major depression 03/31/2013   Melena 08/21/2020   NICM (nonischemic cardiomyopathy) ([place])    a. Nuclear 5/13: Normal stress nuclear study. LV Ejection Fraction: 58%  //  b. LHC 10/14: Minor luminal irregularity in prox LAD, EF 35%    Parkinson disease ([facility])    Plantar fasciitis    Plantar fasciitis, bilateral 06/15/2012   Rosacea 05/29/2009   Qualifier: Diagnosis of  By: [name] MD, [name] hearing loss (SNHL), bilateral 01/04/2017   Sleep apnea    was retested and no longer had it and so d/c CPAP   Syncope    Thoracic or lumbosacral neuritis or radiculitis, unspecified 07/03/2013   Tinnitus, bilateral 01/04/2017   Type II diabetes mellitus with complication ([place]) 12/04/3974   Diabetic eye exam done by [facility]mology: Dr. Prudencio Burly on 11/08/13: no diabetic retinopathy. Repeat in 1 year  Urticaria    Past Surgical History:  Procedure Laterality Date   BREAST EXCISIONAL BIOPSY Left 1970   benign cyst   BREAST SURGERY Left    BUNIONECTOMY     CARDIAC CATHETERIZATION N/A 12/16/2015   Procedure: Right Heart Cath;  Surgeon: Sherren Mocha, MD;  Location: [place] [facility];  Service: Cardiovascular;  Laterality: N/A;   CHOLECYSTECTOMY     eye lid surgery Bilateral 05/09/2019   IMPLANTABLE CARDIOVERTER DEFIBRILLATOR IMPLANT  11-25-13   MDT dual chamber ICD implanted by [name] for primary prevention   IMPLANTABLE CARDIOVERTER DEFIBRILLATOR IMPLANT N/A 11/25/2013   Procedure: IMPLANTABLE CARDIOVERTER DEFIBRILLATOR IMPLANT;  Surgeon: [name], MD;  Location: [facility] CATH LAB;  Service: Cardiovascular;  Laterality: N/A;   LEFT AND RIGHT HEART CATHETERIZATION WITH CORONARY ANGIOGRAM N/A 05/31/2013    Procedure: LEFT AND RIGHT HEART CATHETERIZATION WITH CORONARY ANGIOGRAM;  Surgeon: [name], MD;  Location: [facility] CATH LAB;  Service: Cardiovascular;  Laterality: N/A;   TONSILLECTOMY     TUBAL LIGATION     Patient Active Problem List   Diagnosis Date Noted   Type 2 diabetes mellitus with diabetic polyneuropathy, without long-term current use of insulin ([place]) 09/06/2021   Type 2 diabetes mellitus with stage 3a chronic kidney disease, with long-term current use of insulin ([place]) 09/06/2021   Dyslipidemia 09/06/2021   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) 08/19/2021   Orthostatic hypotension 07/27/2021   Mood changes 07/27/2021   Weight loss 05/07/2021   Acute on chronic systolic CHF (congestive heart failure), NYHA class 4 ([place]) 10/19/2020   Chronic HFrEF (heart failure with reduced ejection fraction) ([place])    Cervical dystonia 10/07/2020   Advanced care planning/counseling discussion 08/21/2020   Dementia without behavioral disturbance ([place]) 08/21/2020   Vision impairment, Right Eye 08/21/2020   Chronic kidney disease, stage 3a ([place]) 08/18/2020   Chronic rhinitis 12/18/2019   Chronic respiratory failure with hypoxia ([place]) 12/18/2019   Hyperlipidemia associated with type 2 diabetes mellitus ([place]) 03/05/2019   Cervical radiculopathy 10/24/2018   Falls frequently    Asthma 08/03/2017   Osteopenia 07/10/2015   Mild persistent asthma in adult without complication 16/08/930   NICM (nonischemic cardiomyopathy) ([place]) 04/23/2014   Arthritis or polyarthritis, rheumatoid ([place]) 03/12/2014   ICD (implantable cardioverter-defibrillator), dual, in situ 03/06/2014   Heart Failure with Recovered Ejection Fraction, G2DD 05/31/2013   Parkinson disease ([place]) 05/21/2013   Vertigo 03/03/2013   Gastroesophageal reflux disease without esophagitis 09/27/2012   Trigger point with neck pain 04/12/2012   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   Diabetic peripheral neuropathy ([place]) 12/09/2011    Chronic urticaria 05/27/2011   Sinus tachycardia ([place]) 05/27/2011   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  Pt received PT in 2021 but no OT.   PMH is significant for:  PD, chronic systolic CHF, diabetes, HTN, HLD, mild persistent asthma, cervical dystonia, chronic kidney disease stage III, peripheral neuropathy, pacemaker / ICD  PRECAUTIONS: impaired sensation both hands due to neuropathy, pacemaker/ICD, fall risk   SUBJECTIVE: The pain is better  PAIN:  Are you having pain? Yes: NPRS scale: 6/10 Pain location: generalized, knees Pain description: aching Aggravating factors: malpositioning Relieving factors: repositioning     OBJECTIVE:   TODAY'S TREATMENT: Supine PWR! up x 10 reps with min cueing, closed-chain shoulder flex to approx 80* and chest press as able with min facilitation for RUE.  Sidelying, PWR! Twist (pone) with min facilitation and mod cueing/min facilitation for lower UE positioning, head turns, LE stabilization.  Sitting, PWR! Up with min cueing, then closed-chain shoulder flex to approx 90* with BUEs (foam noodle) with min facilitation/cues.  Rolling beside table forward/back with forward trunk flex for closed-chain shoulder flex with min cueing.  Reviewed PWR! Hands basic 4, 10 reps each, min v.c, arms resting on arms of chair or table due to shoulder pain, min v.c  Reviewed flipping and  dealing playing cards with min cueing for large amplitude movements   PATIENT EDUCATION: Education details: PWR! Up in sitting, closed-chain shoulder flex  Person educated: Patient Education method: Explanation, demonstration, verbal cues, handout  Education comprehension: verbalized understanding, returned demonstration     HOME EXERCISE PROGRAM: Basic Coordination, PWR! Hands 12/16/21:  Seated PWR! Up, closed-chain shoulder flex    ASSESSMENT:   CLINICAL IMPRESSION:  Pt is progressing towards goals.  Pain and shoulder stiffness limit UE functional use, but pt demo improved movement after exercise.  PERFORMANCE DEFICITS in functional skills including ADLs, IADLs, coordination, dexterity, sensation, tone, ROM, strength, flexibility, FMC, GMC, mobility, balance, decreased knowledge of precautions, decreased knowledge of use of DME, vision, and UE functional use, cognitive skills including attention, memory, problem solving, safety awareness, thought, and understand, and psychosocial skills including coping strategies, environmental adaptation, and routines and behaviors.    IMPAIRMENTS are limiting patient from ADLs, IADLs, rest and sleep, play, leisure, and social participation.    COMORBIDITIES may have co-morbidities  that affects occupational performance. Patient will benefit from skilled OT to address above impairments and improve overall function.   MODIFICATION OR ASSISTANCE TO COMPLETE EVALUATION: Min-Moderate modification of tasks or assist with assess necessary to complete an evaluation.    OT OCCUPATIONAL PROFILE AND HISTORY: Detailed assessment: Review of records and additional review of physical, cognitive, psychosocial history related to current functional performance.   CLINICAL DECISION MAKING: Moderate - several treatment options, min-mod task modification necessary   REHAB POTENTIAL: Good   EVALUATION COMPLEXITY: Moderate       GOALS:     SHORT TERM GOALS:  Target date: 12/29/2021   I with PD specific HEP   Goal status: INITIAL   2.  Pt will verbalize understanding of adapted strategies to maximize safety and I with ADLs/ IADLs .     Goal status: INITIAL   3.  Pt will demonstrate improved fine motor coordination for ADLs as evidenced by decreasing bilateral 9 hole peg test score by 5 secs     Baseline: RUE 71 secs, LUE 90 secs Goal status: INITIAL   4.  Pt will demonstrate improved ease with feeding as evidenced by decreasing PPT#2 (self feeding) by 3 secs Baseline: 16.37 secs Goal status: INITIAL   5.  Pt will demonstrate improved bilateral UE functional use as evidenced by increasing bilateral box/ blocks score by 3 blocks. Baseline: RUE 31 blocks, 32 blocks Goal status: INITIAL   6. Pt will demonstrate understanding of memory compensations and ways to keep thinking skills sharp  Goal status: initial       LONG TERM GOALS: Target date: 02/23/2022   Pt will verbalize understanding of ways to prevent future PD related complications and PD community resources.   Goal status: INITIAL   2.  Pt will demonstrate ability to retrieve a lightweight object at 115 shoulder flexion and -35 elbow extension with RUE.   Baseline: 110, -40 Goal status: INITIAL   3.  Pt will demonstrate ability to retrieve a lightweight object at 90 shoulder flexion and -30 elbow extension with LUE   Baseline: 85, -30 Goal status: INITIAL   4.  Pt will demonstrate improved ease with dressing as evidenced by decreasing PPT#4, donn/ doff jacket in 16 sec or less. Baseline: 19.53 Goal status: INITIAL,   5. Pt will demonstrate ability to complete 3 button/ unbutton in 1 min 40 secs or less. Baseline:  I min 47 secs Goal status: INITIAL       PLAN: OT FREQUENCY: 2x/week   OT DURATION: 12 weeks   PLANNED INTERVENTIONS: self care/ADL training, therapeutic exercise, therapeutic activity, neuromuscular re-education, manual therapy, passive range of  motion, gait training, balance training, functional mobility training, splinting, ultrasound, paraffin, fluidotherapy, moist heat, cryotherapy, contrast bath, patient/family education, cognitive remediation/compensation, visual/perceptual remediation/compensation, energy conservation, coping strategies training, and DME and/or AE instructions   RECOMMENDED OTHER SERVICES: n/a   CONSULTED AND AGREED WITH PLAN OF CARE: Patient   PLAN FOR NEXT SESSION: Review PWR! Up in sitting and closed-chain shoulder flex; try PWR! Twist in sitting,  ADL strategies    Vianne Bulls, OTR/L [facility] [address] [place], [place]  [address] [phone] phone [phone] 12/16/21 2:26 PM

## 2021-12-14 NOTE — Therapy (Signed)
?OUTPATIENT PHYSICAL THERAPY TREATMENT NOTE - ARRIVED NO CHARGE  ? ? ?Patient Name: Veronica Shaw ?MRN: [id number] ?DOB:1951-04-01, 71 y.o., female ?Today's Date: 12/14/2021 ? ?PCP: Lyndee Hensen, DO ?REFERRING PROVIDER: Ludwig Clarks, DO ? ?END OF SESSION:  ? PT End of Session - 12/14/21 4098   ? ? Visit Number 6   ? Number of Visits 17   ? Date for PT Re-Evaluation 02/01/22   ? Authorization Type Medicare   ? Progress Note Due on Visit 10   ? PT Start Time [phone]   pt needing to use restroom at start of session  ? PT Stop Time [phone]   ? PT Time Calculation (min) 11 min   ? Equipment Utilized During Treatment --   ? Activity Tolerance --   limited by pain, pt wanting to end session early  ? Behavior During Therapy [facility] for tasks assessed/performed   ? ?  ?  ? ?  ? ? ? ? ? ?Past Medical History:  ?Diagnosis Date  ? Acute on chronic systolic CHF (congestive heart failure), NYHA class 4 ([place]) 10/19/2020  ? Asthma   ? Back pain 09/18/2019  ? Cervical disc disorder with radiculopathy of cervical region 09/24/2018  ? Chronic systolic CHF (congestive heart failure) ([place])   ? a. cMRI 4/15: EF 34% and findings - c/w NICM, normal RV size and function (RVEF 61%), Mild MR // b. Echo 2/15:  EF 30-35%, diff HK, ant-sept AK, Gr 2 DD, mild MR, trivial TR  //  c. Echo 5/17: EF 20-25%, severe diffuse HK, marked systolic dyssynchrony, grade 1 diastolic dysfunction, mild MR  //  d. RHC 5/17: Fick CO 2.9, RVSP 19, PASP 15, PW mean 2, low filing pressures and preserved CO   ? Cognitive impairment 10/19/2017  ? MOCA was administered with a score of 23/30  ? Diabetes mellitus   ? Diabetic peripheral neuropathy ([place]) 12/09/2011  ? Dyspnea 09/19/2012  ? Flu 10/17/2017  ? Gastritis   ? History of echocardiogram   ? Echo 6/18: EF 30-35, diffuse HK, grade 1 diastolic dysfunction, trivial MR, mild LAE, mild TR, no pericardial effusion  ? History of nuclear stress test   ? Myoview 5/18: EF 49, no ischemia, inferoseptal defect c/w LBBB artifact  (intermediate risk due to EF < 50).  ? HTN (hypertension)   ? Hyperlipidemia   ? Hyperlipidemia associated with type 2 diabetes mellitus ([place]) 03/05/2019  ? Hypertension associated with diabetes ([place]) 09/28/2006  ? Qualifier: Diagnosis of  By: [name]    ? Major depression 03/31/2013  ? Melena 08/21/2020  ? NICM (nonischemic cardiomyopathy) ([place])   ? a. Nuclear 5/13: Normal stress nuclear study. LV Ejection Fraction: 58%  //  b. LHC 10/14: Minor luminal irregularity in prox LAD, EF 35%   ? Parkinson disease ([place])   ? Plantar fasciitis   ? Plantar fasciitis, bilateral 06/15/2012  ? Rosacea 05/29/2009  ? Qualifier: Diagnosis of  By: [name] MD, Rodman Key    ? Sensorineural hearing loss (SNHL), bilateral 01/04/2017  ? Sleep apnea   ? was retested and no longer had it and so d/c CPAP  ? Syncope   ? Thoracic or lumbosacral neuritis or radiculitis, unspecified 07/03/2013  ? Tinnitus, bilateral 01/04/2017  ? Type II diabetes mellitus with complication ([place]) 9/56/2130  ? Diabetic eye exam done by [facility]: Dr. Prudencio Burly on 11/08/13: no diabetic retinopathy. Repeat in 1 year    ? Urticaria   ? ?Past Surgical History:  ?  Procedure Laterality Date  ? BREAST EXCISIONAL BIOPSY Left 1970  ? benign cyst  ? BREAST SURGERY Left   ? BUNIONECTOMY    ? CARDIAC CATHETERIZATION N/A 12/16/2015  ? Procedure: Right Heart Cath;  Surgeon: Sherren Mocha, MD;  Location: [place] [facility];  Service: Cardiovascular;  Laterality: N/A;  ? CHOLECYSTECTOMY    ? eye lid surgery Bilateral 05/09/2019  ? IMPLANTABLE CARDIOVERTER DEFIBRILLATOR IMPLANT  11-25-13  ? MDT dual chamber ICD implanted by [name] for primary prevention  ? IMPLANTABLE CARDIOVERTER DEFIBRILLATOR IMPLANT N/A 11/25/2013  ? Procedure: IMPLANTABLE CARDIOVERTER DEFIBRILLATOR IMPLANT;  Surgeon: [name], MD;  Location: [facility] CATH LAB;  Service: Cardiovascular;  Laterality: N/A;  ? LEFT AND RIGHT HEART CATHETERIZATION WITH CORONARY ANGIOGRAM N/A 05/31/2013  ? Procedure: LEFT AND  RIGHT HEART CATHETERIZATION WITH CORONARY ANGIOGRAM;  Surgeon: [name], MD;  Location: [facility] CATH LAB;  Service: Cardiovascular;  Laterality: N/A;  ? TONSILLECTOMY    ? TUBAL LIGATION    ? ?Patient Active Problem List  ? Diagnosis Date Noted  ? Type 2 diabetes mellitus with diabetic polyneuropathy, without long-term current use of insulin ([place]) 09/06/2021  ? Type 2 diabetes mellitus with stage 3a chronic kidney disease, with long-term current use of insulin ([place]) 09/06/2021  ? Dyslipidemia 09/06/2021  ? 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 Orthostatic hypotension 07/27/2021  ? Mood changes 07/27/2021  ? Weight loss 05/07/2021  ? Acute on chronic systolic CHF (congestive heart failure), NYHA class 4 ([place]) 10/19/2020  ? Chronic HFrEF (heart failure with reduced ejection fraction) ([place])   ? Cervical dystonia 10/07/2020  ? Advanced care planning/counseling discussion 08/21/2020  ? Dementia without behavioral disturbance ([place]) 08/21/2020  ? Vision impairment, Right Eye 08/21/2020  ? Chronic kidney disease, stage 3a ([place]) 08/18/2020  ? Chronic rhinitis 12/18/2019  ? Chronic respiratory failure with hypoxia ([place]) 12/18/2019  ? Hyperlipidemia associated with type 2 diabetes mellitus ([place]) 03/05/2019  ? Cervical radiculopathy 10/24/2018  ? Falls frequently   ? Asthma 08/03/2017  ? Osteopenia 07/10/2015  ? Mild persistent asthma in adult without complication 93/73/4287  ? NICM (nonischemic cardiomyopathy) ([place]) 04/23/2014  ? Arthritis or polyarthritis, rheumatoid ([place]) 03/12/2014  ? ICD (implantable cardioverter-defibrillator), dual, in situ 03/06/2014  ? Heart Failure with Recovered Ejection Fraction, G2DD 05/31/2013  ? Parkinson disease ([place]) 05/21/2013  ? Vertigo 03/03/2013  ? Gastroesophageal reflux disease without esophagitis 09/27/2012  ? Trigger point with neck pain 04/12/2012  ? 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  ? Diabetic peripheral neuropathy ([place]) 12/09/2011  ? Chronic urticaria  05/27/2011  ? Sinus tachycardia ([place]) 05/27/2011  ? 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 05/20/2011  ? Type II diabetes mellitus with complication ([place]) 68/06/5725  ? Hypertension associated with diabetes ([place]) 09/28/2006  ? ? ?REFERRING DIAG: G20 (ICD-10-CM) - Parkinson's disease ([place])  ? ?THERAPY DIAG:  ?Other symptoms and signs involving the nervous system ? ?Unsteadiness on feet ? ?Other abnormalities of gait and mobility ? ?PERTINENT HISTORY: PMH: PD, chronic systolic CHF, diabetes, HTN, HLD, mild persistent asthma, cervical dystonia, chronic kidney disease stage III, peripheral neuropathy.  ?  ?PAIN:  ?Are you having pain? Yes ?NPRS scale: 6/10 ?Pain location: Neck, bilat knees ?PAIN TYPE: aching, sharp, and throbbing ? ? ?PRECAUTIONS: Fall, no driving, defibrillator  ? ?SUBJECTIVE: Has had a few almost falls going down the hallway- bumping into the walls. Was using the cane. Feeling really in pain today, almost did not want to come today. Reports neck is really  bothersome and does not want to do much today.  ? ?  ?TODAY'S TREATMENT:  ? ? ?NMR: ?  ?Pt performs PWR! Moves in seated position  ?  ?PWR! Up for improved posture x10  reps - cues for R elbow extension, open hands, and scap retraction  ?  ?PWR! Rock for improved weighshifting x10 reps, cues to reach across body and elbow extension with RUE  ? ? ?After PWR moves in sitting, pt reporting that she wanted to end session early due to not sleeping well last night and being in a lot of arthritic pain.  ?  ?  ?PATIENT EDUCATION: ?Education details: Importance of using RW at home.  ?Person educated: Patient ?Education method: Explanation ?Education comprehension: verbalized/demonstrated understanding ?  ?  ?HOME EXERCISE PROGRAM: ?[id number], Seated PWR moves   ?  ?  ?GOALS: ?Goals reviewed with patient? Yes ?  ?SHORT TERM GOALS: Target date: 12/01/2021 ?  ?Pt will be independent with initial HEP with family supervision in order to build upon functional gains made in therapy. ?   ?Baseline: ?Goal status: MET ?  ?2.  Pt will improve 5x sit<>stand to less than or equal to 33 sec without UE support to demonstrate improved functional strength and transfer efficiency.  ?  ?Baseline:  39.6

## 2021-12-14 NOTE — Patient Instructions (Signed)
Coordination Exercises ? ?Perform the following exercises for 20 minutes 1 times per day. Perform with both hand(s). Perform using big movements. ? ?Flipping Cards: Place deck of cards on the table. Flip cards over by opening your hand big to grasp and then turn your palm up big. ?Deal cards: Hold 1/2 or whole deck in your hand. Use thumb to push card off top of deck with one big push. ?Pick up coins and stack one at a time: Pick up with big, intentional movements. Do not drag coin to the edge. (5-10 in a stack) ?Pick up 5-10 coins one at a time and hold in palm. Then, move coins from palm to fingertips one at time and place in coin bank/container. ?Perform "Flicks"/hand stretches (PWR! Hands): Close hands then flick out your fingers with focus on opening hands, pulling wrists back, and extending elbows like you are pushing. ?

## 2021-12-14 NOTE — Therapy (Signed)
? ? ?OUTPATIENT OCCUPATIONAL THERAPY TREATMENT NOTE ? ? ?Patient Name: Veronica Shaw ?MRN: [id number] ?DOB:27-Apr-1951, 71 y.o., female ?Today's Date: 12/14/2021 ? ?PCP: Dr. Susa Simmonds ?REFERRING PROVIDER: Dr. Carles Collet ? ? ?END OF SESSION:  ? OT End of Session - 12/14/21 0850   ? ? Visit Number 3   ? Number of Visits 25   ? Date for OT Re-Evaluation 02/23/22   ? Authorization Type Medicare   ? Authorization Time Period POC written for 12 weeks, may d/c after 8 weeks   ? Authorization - Visit Number 3   ? Authorization - Number of Visits 10   ? Progress Note Due on Visit 10   ? OT Start Time 0848   ? OT Stop Time [phone]   ? OT Time Calculation (min) 40 min   ? Behavior During Therapy [facility] for tasks assessed/performed   ? ?  ?  ? ?  ? ? ? ?  ? ?  ? ? ?Past Medical History:  ?Diagnosis Date  ? Acute on chronic systolic CHF (congestive heart failure), NYHA class 4 ([place]) 10/19/2020  ? Asthma   ? Back pain 09/18/2019  ? Cervical disc disorder with radiculopathy of cervical region 09/24/2018  ? Chronic systolic CHF (congestive heart failure) ([place])   ? a. cMRI 4/15: EF 34% and findings - c/w NICM, normal RV size and function (RVEF 61%), Mild MR // b. Echo 2/15:  EF 30-35%, diff HK, ant-sept AK, Gr 2 DD, mild MR, trivial TR  //  c. Echo 5/17: EF 20-25%, severe diffuse HK, marked systolic dyssynchrony, grade 1 diastolic dysfunction, mild MR  //  d. RHC 5/17: Fick CO 2.9, RVSP 19, PASP 15, PW mean 2, low filing pressures and preserved CO   ? Cognitive impairment 10/19/2017  ? MOCA was administered with a score of 23/30  ? Diabetes mellitus   ? Diabetic peripheral neuropathy ([place]) 12/09/2011  ? Dyspnea 09/19/2012  ? Flu 10/17/2017  ? Gastritis   ? History of echocardiogram   ? Echo 6/18: EF 30-35, diffuse HK, grade 1 diastolic dysfunction, trivial MR, mild LAE, mild TR, no pericardial effusion  ? History of nuclear stress test   ? Myoview 5/18: EF 49, no ischemia, inferoseptal defect c/w LBBB artifact (intermediate risk due to EF < 50).  ? HTN  (hypertension)   ? Hyperlipidemia   ? Hyperlipidemia associated with type 2 diabetes mellitus ([place]) 03/05/2019  ? Hypertension associated with diabetes ([place]) 09/28/2006  ? Qualifier: Diagnosis of  By: [name]    ? Major depression 03/31/2013  ? Melena 08/21/2020  ? NICM (nonischemic cardiomyopathy) ([place])   ? a. Nuclear 5/13: Normal stress nuclear study. LV Ejection Fraction: 58%  //  b. LHC 10/14: Minor luminal irregularity in prox LAD, EF 35%   ? Parkinson disease ([place])   ? Plantar fasciitis   ? Plantar fasciitis, bilateral 06/15/2012  ? Rosacea 05/29/2009  ? Qualifier: Diagnosis of  By: [name] MD, Rodman Key    ? Sensorineural hearing loss (SNHL), bilateral 01/04/2017  ? Sleep apnea   ? was retested and no longer had it and so d/c CPAP  ? Syncope   ? Thoracic or lumbosacral neuritis or radiculitis, unspecified 07/03/2013  ? Tinnitus, bilateral 01/04/2017  ? Type II diabetes mellitus with complication ([place]) 9/76/7341  ? Diabetic eye exam done by [facility]mology: Dr. Prudencio Burly on 11/08/13: no diabetic retinopathy. Repeat in 1 year    ? Urticaria   ? ?Past Surgical History:  ?Procedure  Laterality Date  ? BREAST EXCISIONAL BIOPSY Left 1970  ? benign cyst  ? BREAST SURGERY Left   ? BUNIONECTOMY    ? CARDIAC CATHETERIZATION N/A 12/16/2015  ? Procedure: Right Heart Cath;  Surgeon: Sherren Mocha, MD;  Location: [place] CV LAB;  Service: Cardiovascular;  Laterality: N/A;  ? CHOLECYSTECTOMY    ? eye lid surgery Bilateral 05/09/2019  ? IMPLANTABLE CARDIOVERTER DEFIBRILLATOR IMPLANT  11-25-13  ? MDT dual chamber ICD implanted by [name] for primary prevention  ? IMPLANTABLE CARDIOVERTER DEFIBRILLATOR IMPLANT N/A 11/25/2013  ? Procedure: IMPLANTABLE CARDIOVERTER DEFIBRILLATOR IMPLANT;  Surgeon: [name], MD;  Location: [facility] CATH LAB;  Service: Cardiovascular;  Laterality: N/A;  ? LEFT AND RIGHT HEART CATHETERIZATION WITH CORONARY ANGIOGRAM N/A 05/31/2013  ? Procedure: LEFT AND RIGHT HEART CATHETERIZATION WITH CORONARY  ANGIOGRAM;  Surgeon: [name], MD;  Location: [facility] CATH LAB;  Service: Cardiovascular;  Laterality: N/A;  ? TONSILLECTOMY    ? TUBAL LIGATION    ? ?Patient Active Problem List  ? Diagnosis Date Noted  ? Type 2 diabetes mellitus with diabetic polyneuropathy, without long-term current use of insulin ([place]) 09/06/2021  ? Type 2 diabetes mellitus with stage 3a chronic kidney disease, with long-term current use of insulin ([place]) 09/06/2021  ? Dyslipidemia 09/06/2021  ? 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 Orthostatic hypotension 07/27/2021  ? Mood changes 07/27/2021  ? Weight loss 05/07/2021  ? Acute on chronic systolic CHF (congestive heart failure), NYHA class 4 ([place]) 10/19/2020  ? Chronic HFrEF (heart failure with reduced ejection fraction) ([place])   ? Cervical dystonia 10/07/2020  ? Advanced care planning/counseling discussion 08/21/2020  ? Dementia without behavioral disturbance ([place]) 08/21/2020  ? Vision impairment, Right Eye 08/21/2020  ? Chronic kidney disease, stage 3a ([place]) 08/18/2020  ? Chronic rhinitis 12/18/2019  ? Chronic respiratory failure with hypoxia ([place]) 12/18/2019  ? Hyperlipidemia associated with type 2 diabetes mellitus ([place]) 03/05/2019  ? Cervical radiculopathy 10/24/2018  ? Falls frequently   ? Asthma 08/03/2017  ? Osteopenia 07/10/2015  ? Mild persistent asthma in adult without complication 40/03/6760  ? NICM (nonischemic cardiomyopathy) ([place]) 04/23/2014  ? Arthritis or polyarthritis, rheumatoid ([place]) 03/12/2014  ? ICD (implantable cardioverter-defibrillator), dual, in situ 03/06/2014  ? Heart Failure with Recovered Ejection Fraction, G2DD 05/31/2013  ? Parkinson disease ([place]) 05/21/2013  ? Vertigo 03/03/2013  ? Gastroesophageal reflux disease without esophagitis 09/27/2012  ? Trigger point with neck pain 04/12/2012  ? 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  ? Diabetic peripheral neuropathy ([place]) 12/09/2011  ? Chronic urticaria 05/27/2011  ? Sinus tachycardia ([place])  05/27/2011  ? 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 05/20/2011  ? Type II diabetes mellitus with complication ([place]) 95/04/3266  ? Hypertension associated with diabetes ([place]) 09/28/2006  ? ? ?ONSET DATE: 11/26/21  ? ?REFERRING DIAG: Parkinson's Disease ? ?THERAPY DIAG:  ?Other symptoms and signs involving the nervous system ? ?Other lack of coordination ? ?Other disturbances of skin sensation ? ?Stiffness of right shoulder, not elsewhere classified ? ?Stiffness of left elbow, not elsewhere classified ? ?Unsteadiness on feet ? ?Other abnormalities of gait and mobility ? ? ?PERTINENT HISTORY: Patient is a 71 year old female referred to Neuro [facility] for PD.  Pt received PT in 2021 but no OT.  ? PMH is significant for:  PD, chronic systolic CHF, diabetes, HTN, HLD, mild persistent asthma, cervical dystonia, chronic kidney disease stage III, peripheral neuropathy, pacemaker / ICD ? ?PRECAUTIONS: impaired sensation both hands due to neuropathy, pacemaker/ICD,  fall risk  ? ?SUBJECTIVE: Pt reports generalized aching  ? ?PAIN:  ?Are you having pain? Yes: NPRS scale: 8/10- pt reports she did not sleep well ?Pain location: generalized  ?Pain description: aching ?Aggravating factors: malpositioning ?Relieving factors: repositioning ? ? ? ? ?OBJECTIVE:  ? ?TODAY'S TREATMENT: ?Supine PWR! up x 10 reps, attempted PWR! rock however pt is hurting allover and unable to tolerate.  ?Supine closed chain chest press and shoulder flexion to 90*, min facilitation/ v.c for shoulders down ?Reviewed PWR! Hands basic 4, 10 reps each, min v.c, arms resting on arms of chair or table due to shoulder pain, min v.c ?Reviewed flipping and dealing playing cards and added stacking and manipulating coins to HEP, min-mod v.c and demo. ?Pt wears  sunglasses due to light sensitivity, therapist recommends lighter sunglasses or glare shield. Pt was shown amber and gray glasses. ? ? ?PATIENT EDUCATION: ?Education details: reviewed PWR! Hands basic 4,  reviewed beginning  coordination HEP, and added coins task ?Person educated: Patient ?Education method: Explanation, demonstration, verbal cues ?Education comprehension: verbalized understanding, returned demonstration ?  ?  ?HOME EX

## 2021-12-16 ENCOUNTER — Ambulatory Visit: Payer: Medicare Other | Admitting: Physical Therapy

## 2021-12-16 ENCOUNTER — Encounter: Payer: Self-pay | Admitting: Occupational Therapy

## 2021-12-16 ENCOUNTER — Encounter: Payer: Self-pay | Admitting: Physical Therapy

## 2021-12-16 ENCOUNTER — Ambulatory Visit: Payer: Medicare Other | Admitting: Occupational Therapy

## 2021-12-16 DIAGNOSIS — Z9181 History of falling: Secondary | ICD-10-CM | POA: Diagnosis not present

## 2021-12-16 DIAGNOSIS — R208 Other disturbances of skin sensation: Secondary | ICD-10-CM

## 2021-12-16 DIAGNOSIS — R278 Other lack of coordination: Secondary | ICD-10-CM | POA: Diagnosis not present

## 2021-12-16 DIAGNOSIS — M25612 Stiffness of left shoulder, not elsewhere classified: Secondary | ICD-10-CM

## 2021-12-16 DIAGNOSIS — M25621 Stiffness of right elbow, not elsewhere classified: Secondary | ICD-10-CM

## 2021-12-16 DIAGNOSIS — R2681 Unsteadiness on feet: Secondary | ICD-10-CM

## 2021-12-16 DIAGNOSIS — M25611 Stiffness of right shoulder, not elsewhere classified: Secondary | ICD-10-CM

## 2021-12-16 DIAGNOSIS — R29818 Other symptoms and signs involving the nervous system: Secondary | ICD-10-CM | POA: Diagnosis not present

## 2021-12-16 DIAGNOSIS — M25622 Stiffness of left elbow, not elsewhere classified: Secondary | ICD-10-CM

## 2021-12-16 DIAGNOSIS — R2689 Other abnormalities of gait and mobility: Secondary | ICD-10-CM

## 2021-12-16 NOTE — Patient Instructions (Signed)
   SHOULDER: Flexion Bilateral    Hold roll of paper towels with both hands on ends.  Raise arms slowly to shoulder height.  Keep elbows straight.   Repeat 10 times, once a day.

## 2021-12-16 NOTE — Therapy (Signed)
OUTPATIENT PHYSICAL THERAPY TREATMENT NOTE    Patient Name: Veronica Shaw MRN: [id number] DOB:03/18/1951, 71 y.o., female Today's Date: 12/16/2021  PCP: Katha Cabal, DO REFERRING PROVIDER: Vladimir Faster, DO  END OF SESSION:   PT End of Session - 12/16/21 0936     Visit Number 7    Number of Visits 17    Date for PT Re-Evaluation 02/01/22    Authorization Type Medicare    Progress Note Due on Visit 10    PT Start Time 0933    PT Stop Time 1014    PT Time Calculation (min) 41 min    Equipment Utilized During Treatment Gait belt    Activity Tolerance Patient tolerated treatment well    Behavior During Therapy WFL for tasks assessed/performed                Past Medical History:  Diagnosis Date   Acute on chronic systolic CHF (congestive heart failure), NYHA class 4 (HCC) 10/19/2020   Asthma    Back pain 09/18/2019   Cervical disc disorder with radiculopathy of cervical region 09/24/2018   Chronic systolic CHF (congestive heart failure) (HCC)    a. cMRI 4/15: EF 34% and findings - c/w NICM, normal RV size and function (RVEF 61%), Mild MR // b. Echo 2/15:  EF 30-35%, diff HK, ant-sept AK, Gr 2 DD, mild MR, trivial TR  //  c. Echo 5/17: EF 20-25%, severe diffuse HK, marked systolic dyssynchrony, grade 1 diastolic dysfunction, mild MR  //  d. RHC 5/17: Fick CO 2.9, RVSP 19, PASP 15, PW mean 2, low filing pressures and preserved CO    Cognitive impairment 10/19/2017   MOCA was administered with a score of 23/30   Diabetes mellitus    Diabetic peripheral neuropathy (HCC) 12/09/2011   Dyspnea 09/19/2012   Flu 10/17/2017   Gastritis    History of echocardiogram    Echo 6/18: EF 30-35, diffuse HK, grade 1 diastolic dysfunction, trivial MR, mild LAE, mild TR, no pericardial effusion   History of nuclear stress test    Myoview 5/18: EF 49, no ischemia, inferoseptal defect c/w LBBB artifact (intermediate risk due to EF < 50).   HTN (hypertension)    Hyperlipidemia     Hyperlipidemia associated with type 2 diabetes mellitus (HCC) 03/05/2019   Hypertension associated with diabetes (HCC) 09/28/2006   Qualifier: Diagnosis of  By: Abundio Miu     Major depression 03/31/2013   Melena 08/21/2020   NICM (nonischemic cardiomyopathy) (HCC)    a. Nuclear 5/13: Normal stress nuclear study. LV Ejection Fraction: 58%  //  b. LHC 10/14: Minor luminal irregularity in prox LAD, EF 35%    Parkinson disease (HCC)    Plantar fasciitis    Plantar fasciitis, bilateral 06/15/2012   Rosacea 05/29/2009   Qualifier: Diagnosis of  By: Constance Goltz MD, Marcie Mowers hearing loss (SNHL), bilateral 01/04/2017   Sleep apnea    was retested and no longer had it and so d/c CPAP   Syncope    Thoracic or lumbosacral neuritis or radiculitis, unspecified 07/03/2013   Tinnitus, bilateral 01/04/2017   Type II diabetes mellitus with complication (HCC) 09/28/2006   Diabetic eye exam done by [facility]: Dr. Randon Goldsmith on 11/08/13: no diabetic retinopathy. Repeat in 1 year     Urticaria    Past Surgical History:  Procedure Laterality Date   BREAST EXCISIONAL BIOPSY Left 1970   benign cyst   BREAST SURGERY Left  BUNIONECTOMY     CARDIAC CATHETERIZATION N/A 12/16/2015   Procedure: Right Heart Cath;  Surgeon: Sherren Mocha, MD;  Location: [facility];  Service: Cardiovascular;  Laterality: N/A;   CHOLECYSTECTOMY     eye lid surgery Bilateral 05/09/2019   IMPLANTABLE CARDIOVERTER DEFIBRILLATOR IMPLANT  11-25-13   MDT dual chamber ICD implanted by [name] for primary prevention   IMPLANTABLE CARDIOVERTER DEFIBRILLATOR IMPLANT N/A 11/25/2013   Procedure: IMPLANTABLE CARDIOVERTER DEFIBRILLATOR IMPLANT;  Surgeon: [name], MD;  Location: [facility] CATH LAB;  Service: Cardiovascular;  Laterality: N/A;   LEFT AND RIGHT HEART CATHETERIZATION WITH CORONARY ANGIOGRAM N/A 05/31/2013   Procedure: LEFT AND RIGHT HEART CATHETERIZATION WITH CORONARY ANGIOGRAM;  Surgeon: [name],  MD;  Location: [facility] CATH LAB;  Service: Cardiovascular;  Laterality: N/A;   TONSILLECTOMY     TUBAL LIGATION     Patient Active Problem List   Diagnosis Date Noted   Type 2 diabetes mellitus with diabetic polyneuropathy, without long-term current use of insulin ([place]) 09/06/2021   Type 2 diabetes mellitus with stage 3a chronic kidney disease, with long-term current use of insulin ([place]) 09/06/2021   Dyslipidemia 09/06/2021   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) 08/19/2021   Orthostatic hypotension 07/27/2021   Mood changes 07/27/2021   Weight loss 05/07/2021   Acute on chronic systolic CHF (congestive heart failure), NYHA class 4 ([place]) 10/19/2020   Chronic HFrEF (heart failure with reduced ejection fraction) ([place])    Cervical dystonia 10/07/2020   Advanced care planning/counseling discussion 08/21/2020   Dementia without behavioral disturbance ([place]) 08/21/2020   Vision impairment, Right Eye 08/21/2020   Chronic kidney disease, stage 3a ([place]) 08/18/2020   Chronic rhinitis 12/18/2019   Chronic respiratory failure with hypoxia ([place]) 12/18/2019   Hyperlipidemia associated with type 2 diabetes mellitus ([place]) 03/05/2019   Cervical radiculopathy 10/24/2018   Falls frequently    Asthma 08/03/2017   Osteopenia 07/10/2015   Mild persistent asthma in adult without complication 01/65/5374   NICM (nonischemic cardiomyopathy) ([place]) 04/23/2014   Arthritis or polyarthritis, rheumatoid ([place]) 03/12/2014   ICD (implantable cardioverter-defibrillator), dual, in situ 03/06/2014   Heart Failure with Recovered Ejection Fraction, G2DD 05/31/2013   Parkinson disease ([place]) 05/21/2013   Vertigo 03/03/2013   Gastroesophageal reflux disease without esophagitis 09/27/2012   Trigger point with neck pain 04/12/2012   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   Diabetic peripheral neuropathy ([place]) 12/09/2011   Chronic urticaria 05/27/2011   Sinus tachycardia ([place]) 05/27/2011   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  05/20/2011   Type II diabetes mellitus with complication ([place]) 82/70/7867   Hypertension associated with diabetes ([place]) 09/28/2006    REFERRING DIAG: G20 (ICD-10-CM) - Parkinson's disease ([place])   THERAPY DIAG:  Unsteadiness on feet  Other abnormalities of gait and mobility  PERTINENT HISTORY: PMH: PD, chronic systolic CHF, diabetes, HTN, HLD, mild persistent asthma, cervical dystonia, chronic kidney disease stage III, peripheral neuropathy.     PRECAUTIONS: Fall, no driving, defibrillator   SUBJECTIVE: Denies any falls. Feeling better today.  Has been doing the ex's at home some, not much. Last week was good for the walking program, not so much this week due to pain.     PAIN:  Are you having pain? Yes NPRS scale: 4/10 Pain location: Neck, bilat knees PAIN TYPE: aching, sharp, and throbbing   TODAY'S TREATMENT:  12/16/2021 STRENGTHENING  Scifit UE/LE's level 3.0 x 8 minutes with goal >/= 70 steps per minute to work on strengthening, reciprocal movements and  activity tolerance.    GAIT: Gait pattern: step through pattern, decreased stride length, and narrow BOS Distance walked: 500 x 1 in/outdoors, plus around clinic with session Assistive device utilized: Walker - 4 wheeled Level of assistance:  supervision  Comments: cues for hand placement on brakes of rollator so to be ready to engage breaks as needed and for increased step/stride length. No balance issues noted with gait on unlevel paved surfaces.    BALANCE/NMR Stepping Strategy: standing across red foam beam with no UE support for alternating stepping forward to floor/back onto beam for 10 reps each side, min guard to min assist with cues for increased step length/height.  Rockerboard: performed in the anterior/posterior direction- rocking the board with emphasis on tall posture with EO, progressing to EC with min guard to min assist. Cues on posture and weight shifting to assist with balance. Then holding the board  steady for EC 30 seconds x 3 reps with up to min assist. No UE support with occasional touch to bars with balance board activities.       PATIENT EDUCATION: Education details: continue with current HEP and walking program Person educated: Patient Education method: Explanation Education comprehension: verbalized/demonstrated understanding     HOME EXERCISE PROGRAM: [id number], Seated PWR moves       GOALS: Goals reviewed with patient? Yes   SHORT TERM GOALS: Target date: 12/01/2021   Pt will be independent with initial HEP with family supervision in order to build upon functional gains made in therapy.   Baseline: Goal status: MET   2.  Pt will improve 5x sit<>stand to less than or equal to 33 sec without UE support to demonstrate improved functional strength and transfer efficiency.    Baseline:  39.65 without UE support; 25.41s without UE support  Goal status: MET   3.  Pt will improve gait speed with rollator to at least 1.3 ft/sec in order to demo improved household mobility/decr fall risk.    Baseline:  .98 ft/sec; 1.55 ft/s w/rollator  Goal status: MET   4.  Pt will perform condition 2 of mCTSIB for at least 30 sec in order to demo improved vestibular input for balance. Baseline: 19.6 sec; 30.34s w/S* Goal status: MET   5.  Pt will verbalize understanding of local Parkinson's disease resources, including options for community fitness. Baseline:  Goal status: PROGRESSING        LONG TERM GOALS: Target date: 12/29/2021   Pt will be independent with final HEP with family supervision in order to build upon functional gains made in therapy. Baseline:  Goal status: INITIAL   2.  Pt will recover posterior and anterior balance in push and release test in 3 or less steps independently, for improved balance recovery.  Baseline: anterior = 5-6 small steps, posterior = multiple and needs assist from therapist to maintain balance. Goal status: INITIAL   3.  Pt will perform  condition 4 of mCTSIB for at least 10 sec in order to demo improved vestibular input for balance. Baseline: 2 sec Goal status: INITIAL   4.  Pt will improve gait speed with rollator to at least 1.8 ft/sec in order to demo improved household mobility/decr fall risk.  Baseline: .98 ft/sec w/ rollator Goal status: INITIAL   5.  Pt will improve 5x sit<>stand to less than or equal to 20 sec without UE support to demonstrate improved functional strength and transfer efficiency.  Baseline: 39.65 sec without UE support Goal status: REVISED  ASSESSMENT:   CLINICAL IMPRESSION:    OBJECTIVE IMPAIRMENTS Abnormal gait, decreased activity tolerance, decreased balance, decreased coordination, decreased endurance, decreased knowledge of use of DME, difficulty walking, decreased strength, dizziness, impaired tone, postural dysfunction, and pain.    ACTIVITY LIMITATIONS cleaning, community activity, driving, meal prep, and laundry.    PERSONAL FACTORS Behavior pattern, Past/current experiences, Time since onset of injury/illness/exacerbation, and 3+ comorbidities: PD, chronic systolic CHF, diabetes, HTN, HLD, mild persistent asthma, cervical dystonia, chronic kidney disease stage III, peripheral neuropathy  are also affecting patient's functional outcome.      REHAB POTENTIAL: Good   CLINICAL DECISION MAKING: Evolving/moderate complexity   EVALUATION COMPLEXITY: Moderate   PLAN: PT FREQUENCY: 2x/week   PT DURATION: 12 weeks   PLANNED INTERVENTIONS: Therapeutic exercises, Therapeutic activity, Neuromuscular re-education, Balance training, Gait training, Patient/Family education, Stair training, Vestibular training, Canalith repositioning, and DME instructions   PLAN FOR NEXT SESSION:    Balance with vision removed, stepping strategies for balance, SLS.  Review Seated PWR moves as needed. SciFit/Nustep for strengthening/endurance. Potentially needs further vestibular assessment.  Willow Ora, PTA, [place] [address] [place], [place] [address] [phone] 12/16/21, 10:42 AM

## 2021-12-17 ENCOUNTER — Ambulatory Visit: Payer: Medicare Other

## 2021-12-17 NOTE — Patient Instructions (Signed)
Visit Information  [name]  it was nice speaking with you. Please call me directly [phone] if you have questions about the goals we discussed.  Patient Goals/Self Care Activities: -Patient/Caregiver will self-administer medications as prescribed as evidenced by self-report/primary caregiver report  -Patient/Caregiver will attend all scheduled provider appointments as evidenced by clinician review of documented attendance to scheduled appointments and patient/caregiver report -Patient/Caregiver will call pharmacy for medication refills as evidenced by patient report and review of pharmacy fill history as appropriate -Patient/Caregiver will call provider office for new concerns or questions as evidenced by review of documented incoming telephone call notes and patient report -Patient/Caregiver verbalizes understanding of plan -Patient/Caregiver will focus on medication adherence by taking medications as prescribed -Weigh daily and record (notify MD with 3 lb weight gain over night or 5 lb in a week) -Follow CHF Action Plan -Adhere to low sodium diet           Patient verbalizes understanding of instructions and care plan provided today and agrees to view in [place]. Active MyChart status and patient understanding of how to access instructions and care plan via MyChart confirmed with patient.     Follow up Plan: Patient would like continued follow-up.  CCM RNCM will outreach the patient within the next 4 weeks.  Patient will call office if needed prior to next encounter  Lazaro Arms, RN  [phone]

## 2021-12-17 NOTE — Chronic Care Management (AMB) (Signed)
Chronic Care Management   CCM RN Visit Note  12/17/2021 Name: Veronica Shaw MRN: [id number] DOB: 09/10/1950  Subjective: Veronica Shaw is a 71 y.o. year old female who is a primary care patient of Lyndee Hensen, DO. The care management team was consulted for assistance with disease management and care coordination needs.    Engaged with patient by telephone for follow up visit in response to provider referral for case management and/or care coordination services.   Consent to Services:  The patient was given information about Chronic Care Management services, agreed to services, and gave verbal consent prior to initiation of services.  Please see initial visit note for detailed documentation.   Patient agreed to services and verbal consent obtained.    Summary: Patient is making progress with CHF , but currently is experiencing difficulty with dizziness and is currently going to PT and OT to help and also build strength . See Care Plan below for interventions and patient self-care actives.  Recommendation: The patient may benefit from taking medications as prescribed, exercising as tolerated, weighing Daily and record values, and The patient agrees.  Follow up Plan: Patient would like continued follow-up.  CCM RNCM will outreach the patient within the next 4 weeks.  Patient will call office if needed prior to next encounter  Assessment: Review of patient past medical history, allergies, medications, health status, including review of consultants reports, laboratory and other test data, was performed as part of comprehensive evaluation and provision of chronic care management services.   SDOH (Social Determinants of Health) assessments and interventions performed:  No  CCM Care Plan  Allergies  Allergen Reactions   Aspirin Swelling and Other (See Comments)    Other reaction(s): Other (See Comments) Causes nose bleeds *ONLY THE COATED ASA* Causes nose bleeds *ONLY THE  COATED ASA*   Penicillins Shortness Of Breath and Rash    Shortness of Breath - Throat felt like it was closing.    Prempro [Conj Estrog-Medroxyprogest Ace] Shortness Of Breath    Throat swelling Throat swelling   Ace Inhibitors Cough   Simvastatin Other (See Comments) and Rash    Muscle aches    Outpatient Encounter Medications as of 12/17/2021  Medication Sig   acetaminophen (TYLENOL) 500 MG tablet Take 2 tablets (1,000 mg total) by mouth every 8 (eight) hours as needed.   APPLE CIDER VINEGAR PO Take 1 capsule by mouth daily.   Biotin 5000 MCG CAPS Take 1 tablet by mouth daily.    budesonide-formoterol (SYMBICORT) 160-4.5 MCG/ACT inhaler Inhale 2 puffs into the lungs 2 (two) times daily.   carbidopa-levodopa (SINEMET CR) 50-200 MG tablet Take 1 tablet by mouth at bedtime.   carbidopa-levodopa (SINEMET IR) 25-100 MG tablet Take 1 tablet by mouth 3 (three) times daily.   diclofenac Sodium (VOLTAREN) 1 % GEL GIVE 4  TOPICALLY 4 TIMES DAILY   famotidine (PEPCID) 20 MG tablet TAKE 1 TABLET TWICE DAILY   fluticasone (FLONASE SENSIMIST) 27.5 MCG/SPRAY nasal spray Place 2 sprays into the nose daily.   furosemide (LASIX) 20 MG tablet Take 20 mg by mouth daily.   gabapentin (NEURONTIN) 100 MG capsule Take 100 mg by mouth daily.   glipiZIDE (GLUCOTROL) 5 MG tablet Take 0.5 tablets (2.5 mg total) by mouth daily before breakfast.   levalbuterol (XOPENEX) 0.63 MG/3ML nebulizer solution USE 1 VIAL IN NEBULIZER EVERY 4 TO 6 HOURS AS NEEDED FOR WHEEZING AND FOR SHORTNESS OF BREATH   losartan (COZAAR) 25 MG tablet Take 0.5  tablets (12.5 mg total) by mouth daily.   melatonin 5 MG TABS Take 5 mg by mouth at bedtime.   metFORMIN (GLUCOPHAGE) 500 MG tablet Take 1 tablet (500 mg total) by mouth 2 (two) times daily with a meal.   metoprolol succinate (TOPROL-XL) 50 MG 24 hr tablet Take 1 tablet (50 mg total) by mouth 2 (two) times daily.   mirtazapine (REMERON) 15 MG tablet Take 1 tablet (15 mg total) by  mouth at bedtime.   montelukast (SINGULAIR) 10 MG tablet Take 1 tablet (10 mg total) by mouth at bedtime.   OXYGEN Inhale into the lungs at bedtime. Inhale 2L into lungs   potassium chloride (KLOR-CON) 10 MEQ tablet Take 1 tablet (10 mEq total) by mouth daily.   rosuvastatin (CRESTOR) 10 MG tablet Take 1 tablet (10 mg total) by mouth as directed. Three times a week   spironolactone (ALDACTONE) 25 MG tablet TAKE 1/2 TABLET EVERY DAY   tiZANidine (ZANAFLEX) 2 MG tablet TAKE 1 TABLET BY MOUTH EVERY 8 HOURS AS NEEDED FOR MUSCLE SPASM (Patient not taking: Reported on 09/06/2021)   Ubiquinone (ULTRA COQ10 PO) Take 1 tablet by mouth 3 (three) times a week.   No facility-administered encounter medications on file as of 12/17/2021.    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) 08/19/2021   Orthostatic hypotension 07/27/2021   Mood changes 07/27/2021   Weight loss 05/07/2021   Acute on chronic systolic CHF (congestive heart failure), NYHA class 4 ([place]) 10/19/2020   Chronic HFrEF (heart failure with reduced ejection fraction) ([place])    Cervical dystonia 10/07/2020   Advanced care planning/counseling discussion 08/21/2020   Dementia without behavioral disturbance ([place]) 08/21/2020   Vision impairment, Right Eye 08/21/2020   Chronic kidney disease, stage 3a ([place]) 08/18/2020   Chronic rhinitis 12/18/2019   Chronic respiratory failure with hypoxia ([place]) 12/18/2019   Hyperlipidemia associated with type 2 diabetes mellitus ([place]) 03/05/2019   Cervical radiculopathy 10/24/2018   Falls frequently    Asthma 08/03/2017   Osteopenia 07/10/2015   Mild persistent asthma in adult without complication  40/05/2724   NICM (nonischemic cardiomyopathy) ([place]) 04/23/2014   Arthritis or polyarthritis, rheumatoid ([place]) 03/12/2014   ICD (implantable cardioverter-defibrillator), dual, in situ 03/06/2014   Heart Failure with Recovered Ejection Fraction, G2DD 05/31/2013   Parkinson disease ([place]) 05/21/2013   Vertigo 03/03/2013   Gastroesophageal reflux disease without esophagitis 09/27/2012   Trigger point with neck pain 04/12/2012   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   Diabetic peripheral neuropathy ([place]) 12/09/2011   Chronic urticaria 05/27/2011   Sinus tachycardia ([place]) 05/27/2011   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 05/20/2011   Type II diabetes mellitus with complication ([place]) 36/64/4034   Hypertension associated with diabetes ([place]) 09/28/2006    Conditions to be addressed/monitored:CHF  Care Plan : RN Case Manager  Updates made by Lazaro Arms, RN since 12/17/2021 12:00 AM     Problem: Symptom Exacerbation (Heart Failure)      Long-Range Goal: Patient to manage and monitor signs and symptoms of Heart Failure   Start Date: 11/03/2020  Expected End Date: 08/31/2021  Priority: High  Note:   Current Barriers:  Knowledge deficit related to basic heart failure pathophysiology and  self care management  RNCM Clinical Goal(s):  Patient will verbalize understanding of plan for management of CHF as evidenced by by 90 days of no admissions to the hospital  through collaboration with RN Care manager, provider, and care team.   Interventions: 1:1 collaboration with primary care provider regarding development and update of comprehensive plan of care as evidenced by provider attestation and co-signature Inter-disciplinary care team collaboration (see longitudinal plan of care) Evaluation of current treatment plan related to  self management and patient's adherence to plan as established by provider   Heart Failure Interventions:  (Status:  Condition stable.  Not addressed this visit.) Long Term  Goal  Interventions: Goal on Track Basic overview and discussion of  Heart Failure reviewed Reviewed Heart Failure Action Plan  Discussed importance of daily weight and advised patient to weigh and record daily- 12/17/21: The patient reports no chest pain, shortness of breath, or swelling and mentions that her condition remains stable. However, she does experience pain in her hands and feet, rated at 5 out of 10. Additionally, she continues to experience dizziness, but attending physical and occupational therapy sessions has helped manage her symptoms and build her strength.   Patient Goals/Self Care Activities: -Patient/Caregiver will self-administer medications as prescribed as evidenced by self-report/primary caregiver report  -Patient/Caregiver will attend all scheduled provider appointments as evidenced by clinician review of documented attendance to scheduled appointments and patient/caregiver report -Patient/Caregiver will call pharmacy for medication refills as evidenced by patient report and review of pharmacy fill history as appropriate -Patient/Caregiver will call provider office for new concerns or questions as evidenced by review of documented incoming telephone call notes and patient report -Patient/Caregiver verbalizes understanding of plan -Patient/Caregiver will focus on medication adherence by taking medications as prescribed -Weigh daily and record (notify MD with 3 lb weight gain over night or 5 lb in a week) -Follow CHF Action Plan -Adhere to low sodium diet     Lazaro Arms RN, BSN, [place] Management Coordinator [facility]  Phone: [phone]

## 2021-12-21 ENCOUNTER — Ambulatory Visit: Payer: Medicare Other | Admitting: Physical Therapy

## 2021-12-22 ENCOUNTER — Encounter: Payer: Self-pay | Admitting: Cardiovascular Disease

## 2021-12-22 ENCOUNTER — Ambulatory Visit (INDEPENDENT_AMBULATORY_CARE_PROVIDER_SITE_OTHER): Payer: Medicare Other | Admitting: Cardiovascular Disease

## 2021-12-22 VITALS — BP 100/56 | HR 80 | Ht 61.5 in | Wt 121.2 lb

## 2021-12-22 DIAGNOSIS — Z95810 Presence of automatic (implantable) cardiac defibrillator: Secondary | ICD-10-CM

## 2021-12-22 DIAGNOSIS — N1831 Chronic kidney disease, stage 3a: Secondary | ICD-10-CM | POA: Diagnosis not present

## 2021-12-22 DIAGNOSIS — I5022 Chronic systolic (congestive) heart failure: Secondary | ICD-10-CM

## 2021-12-22 NOTE — Patient Instructions (Signed)
Medication Instructions:  Your physician recommends that you continue on your current medications as directed. Please refer to the Current Medication list given to you today.  *If you need a refill on your cardiac medications before your next appointment, please call your pharmacy*   Lab Work: NONE If you have labs (blood work) drawn today and your tests are completely normal, you will receive your results only by: [place] (if you have MyChart) OR A paper copy in the mail If you have any lab test that is abnormal or we need to change your treatment, we will call you to review the results.   Testing/Procedures: NONE   Follow-Up: At [facility], you and your health needs are our priority.  As part of our continuing mission to provide you with exceptional heart care, we have created designated Provider Care Teams.  These Care Teams include your primary Cardiologist (physician) and Advanced Practice Providers (APPs -  Physician Assistants and Nurse Practitioners) who all work together to provide you with the care you need, when you need it.  We recommend signing up for the patient portal called "MyChart".  Sign up information is provided on this After Visit Summary.  MyChart is used to connect with patients for Virtual Visits (Telemedicine).  Patients are able to view lab/test results, encounter notes, upcoming appointments, etc.  Non-urgent messages can be sent to your provider as well.   To learn more about what you can do with MyChart, go to [email or web address].    Your next appointment:   6 month(s)  The format for your next appointment:   In Person  Provider:   Robbie Lis, PA-C, Christen Bame, NP, or Richardson Dopp, PA-C     Then, Sherren Mocha, MD will plan to see you again in 1 year(s).      Important Information About Sugar

## 2021-12-22 NOTE — Progress Notes (Signed)
Cardiology Office Note:    Date:  12/22/2021   ID:  Veronica Shaw, DOB 10/03/1950, MRN [id number]  PCP:  Lyndee Hensen, DO   [facility] Providers Cardiologist:  Sherren Mocha, MD Cardiology APP:  Liliane Shi, PA-C  Electrophysiologist:  Cristopher Peru, MD     Referring MD: Lyndee Hensen, DO   Chief Complaint  Patient presents with   Congestive Heart Failure    History of Present Illness:    Veronica Shaw is a 71 y.o. female with a hx of: HFmrEF (heart failure with mildly reduced ejection fraction)  Non-ischemic cardiomyopathy Cath 2014: no sig CAD CMR 4/15: EF 34 Echocardiogram 5/17: EF 20-25 Echocardiogram 6/18: EF 30-35 Echocardiogram 3/19: EF 60-65, Gr 2 DD Echo 09/2020: EF 40-45 S/p AICD Diabetes mellitus Hyperlipidemia OSA Asthma Parkinson's Disease Syncope 09/2017 Cervical spine DDD  The patient is here alone today. States that she is now on nocturnal O2 after having a sleep study performed. She has noticed progression of her Parkinson's disease and is now ambulating with a walker. Her tremor has improved with B12. Today, she denies symptoms of palpitations, chest pain, shortness of breath, orthopnea, PND, lower extremity edema, dizziness, or syncope. She has lost about 10 pounds since she had Covid last year. She still hasn't regained normal taste and reports poor appetite. She plans to follow-up with her PCP regarding these complaints.    Past Medical History:  Diagnosis Date   Acute on chronic systolic CHF (congestive heart failure), NYHA class 4 (HCC) 10/19/2020   Asthma    Back pain 09/18/2019   Cervical disc disorder with radiculopathy of cervical region 6/76/7209   Chronic systolic CHF (congestive heart failure) ([place])    a. cMRI 4/15: EF 34% and findings - c/w NICM, normal RV size and function (RVEF 61%), Mild MR // b. Echo 2/15:  EF 30-35%, diff HK, ant-sept AK, Gr 2 DD, mild MR, trivial TR  //  c. Echo 5/17: EF 20-25%, severe diffuse HK,  marked systolic dyssynchrony, grade 1 diastolic dysfunction, mild MR  //  d. [place] 5/17: Fick CO 2.9, RVSP 19, PASP 15, PW mean 2, low filing pressures and preserved CO    Cognitive impairment 10/19/2017   MOCA was administered with a score of 23/30   Diabetes mellitus    Diabetic peripheral neuropathy ([place]) 12/09/2011   Dyspnea 09/19/2012   Flu 10/17/2017   Gastritis    History of echocardiogram    Echo 6/18: EF 30-35, diffuse HK, grade 1 diastolic dysfunction, trivial MR, mild LAE, mild TR, no pericardial effusion   History of nuclear stress test    Myoview 5/18: EF 49, no ischemia, inferoseptal defect c/w LBBB artifact (intermediate risk due to EF < 50).   HTN (hypertension)    Hyperlipidemia    Hyperlipidemia associated with type 2 diabetes mellitus ([place]) 03/05/2019   Hypertension associated with diabetes ([place]) 09/28/2006   Qualifier: Diagnosis of  By: [name]     Major depression 03/31/2013   Melena 08/21/2020   NICM (nonischemic cardiomyopathy) ([place])    a. Nuclear 5/13: Normal stress nuclear study. LV Ejection Fraction: 58%  //  b. LHC 10/14: Minor luminal irregularity in prox LAD, EF 35%    Parkinson disease (HCC)    Plantar fasciitis    Plantar fasciitis, bilateral 06/15/2012   Rosacea 05/29/2009   Qualifier: Diagnosis of  By: [name] MD, [name] hearing loss (SNHL), bilateral 01/04/2017   Sleep apnea  was retested and no longer had it and so d/c CPAP   Syncope    Thoracic or lumbosacral neuritis or radiculitis, unspecified 07/03/2013   Tinnitus, bilateral 01/04/2017   Type II diabetes mellitus with complication ([place]) 3/87/5643   Diabetic eye exam done by [facility]: Dr. Prudencio Burly on 11/08/13: no diabetic retinopathy. Repeat in 1 year     Urticaria     Past Surgical History:  Procedure Laterality Date   BREAST EXCISIONAL BIOPSY Left 1970   benign cyst   BREAST SURGERY Left    BUNIONECTOMY     CARDIAC CATHETERIZATION N/A 12/16/2015   Procedure:  Right Heart Cath;  Surgeon: Sherren Mocha, MD;  Location: [facility];  Service: Cardiovascular;  Laterality: N/A;   CHOLECYSTECTOMY     eye lid surgery Bilateral 05/09/2019   IMPLANTABLE CARDIOVERTER DEFIBRILLATOR IMPLANT  11-25-13   MDT dual chamber ICD implanted by [name] for primary prevention   IMPLANTABLE CARDIOVERTER DEFIBRILLATOR IMPLANT N/A 11/25/2013   Procedure: IMPLANTABLE CARDIOVERTER DEFIBRILLATOR IMPLANT;  Surgeon: [name], MD;  Location: [facility] CATH LAB;  Service: Cardiovascular;  Laterality: N/A;   LEFT AND RIGHT HEART CATHETERIZATION WITH CORONARY ANGIOGRAM N/A 05/31/2013   Procedure: LEFT AND RIGHT HEART CATHETERIZATION WITH CORONARY ANGIOGRAM;  Surgeon: [name], MD;  Location: [facility] C[facility] LAB;  Service: Cardiovascular;  Laterality: N/A;   TONSILLECTOMY     TUBAL LIGATION      Current Medications: Current Meds  Medication Sig   acetaminophen (TYLENOL) 500 MG tablet Take 2 tablets (1,000 mg total) by mouth every 8 (eight) hours as needed.   APPLE CIDER VINEGAR PO Take 1 capsule by mouth daily.   Biotin 5000 MCG CAPS Take 1 tablet by mouth daily.    budesonide-formoterol (SYMBICORT) 160-4.5 MCG/ACT inhaler Inhale 2 puffs into the lungs 2 (two) times daily.   carbidopa-levodopa (SINEMET CR) 50-200 MG tablet Take 1 tablet by mouth at bedtime.   carbidopa-levodopa (SINEMET IR) 25-100 MG tablet Take 1 tablet by mouth 3 (three) times daily.   diclofenac Sodium (VOLTAREN) 1 % GEL GIVE 4  TOPICALLY 4 TIMES DAILY   estradiol (ESTRACE) 0.1 MG/GM vaginal cream every other day.   famotidine (PEPCID) 20 MG tablet TAKE 1 TABLET TWICE DAILY   fluticasone (FLONASE SENSIMIST) 27.5 MCG/SPRAY nasal spray Place 2 sprays into the nose daily.   furosemide (LASIX) 20 MG tablet Take 20 mg by mouth daily.   gabapentin (NEURONTIN) 100 MG capsule Take 100 mg by mouth daily.   glipiZIDE (GLUCOTROL) 5 MG tablet Take 0.5 tablets (2.5 mg total) by mouth daily before breakfast.    levalbuterol (XOPENEX) 0.63 MG/3ML nebulizer solution USE 1 VIAL IN NEBULIZER EVERY 4 TO 6 HOURS AS NEEDED FOR WHEEZING AND FOR SHORTNESS OF BREATH   losartan (COZAAR) 25 MG tablet Take 0.5 tablets (12.5 mg total) by mouth daily.   melatonin 5 MG TABS Take 5 mg by mouth at bedtime.   metFORMIN (GLUCOPHAGE) 500 MG tablet Take 1 tablet (500 mg total) by mouth 2 (two) times daily with a meal.   metoprolol succinate (TOPROL-XL) 50 MG 24 hr tablet Take 1 tablet (50 mg total) by mouth 2 (two) times daily.   mirtazapine (REMERON) 15 MG tablet Take 1 tablet (15 mg total) by mouth at bedtime.   montelukast (SINGULAIR) 10 MG tablet Take 1 tablet (10 mg total) by mouth at bedtime.   OXYGEN Inhale into the lungs at bedtime. Inhale 2L into lungs   potassium chloride (KLOR-CON)  10 MEQ tablet Take 1 tablet (10 mEq total) by mouth daily.   rosuvastatin (CRESTOR) 10 MG tablet Take 1 tablet (10 mg total) by mouth as directed. Three times a week   spironolactone (ALDACTONE) 25 MG tablet TAKE 1/2 TABLET EVERY DAY   tiZANidine (ZANAFLEX) 2 MG tablet TAKE 1 TABLET BY MOUTH EVERY 8 HOURS AS NEEDED FOR MUSCLE SPASM   Ubiquinone (ULTRA COQ10 PO) Take 1 tablet by mouth 3 (three) times a week.     Allergies:   Aspirin, Penicillins, Prempro [conj estrog-medroxyprogest ace], Ace inhibitors, and Simvastatin   Social History   Socioeconomic History   Marital status: Married    Spouse name: [name]    Number of children: 2   Years of education: 12   Highest education level: 12th grade  Occupational History   Occupation: retired    Comment: CNA  Tobacco Use   Smoking status: Never   Smokeless tobacco: Never   Tobacco comments:    exposed to smoke during child hood (parents)  Vaping Use   Vaping Use: Never used  Substance and Sexual Activity   Alcohol use: No    Alcohol/week: 0.0 standard drinks   Drug use: No   Sexual activity: Yes    Birth control/protection: Post-menopausal  Other Topics Concern   Not on  file  Social History Narrative   Patient lives with her husband [name].   Patient enjoys spending time with her family, cooking, and her 2 dogs.    Patient has one daughter who lives locally, and one son who lives in [place].   Social Determinants of Health   Financial Resource Strain: Not on file  Food Insecurity: Not on file  Transportation Needs: Not on file  Physical Activity: Not on file  Stress: Not on file  Social Connections: Not on file     Family History: The patient's family history includes Coronary artery disease in her father and mother; Diabetes in her father, mother, sister, sister, and sister; Diabetes (age of onset: 72) in her son; Healthy in her daughter; Heart attack in her father and mother; Heart disease in her sister, sister, and sister; Hepatitis C in her sister; Parkinson's disease in her sister. There is no history of Breast cancer.  ROS:   Please see the history of present illness.    All other systems reviewed and are negative.  EKGs/Labs/Other Studies Reviewed:    The following studies were reviewed today: Echo 10/18/2020:  1. Left ventricular ejection fraction, by estimation, is 40 to 45%. The  left ventricle has mildly decreased function. The left ventricle  demonstrates global hypokinesis. Left ventricular diastolic function could  not be evaluated.   2. Right ventricular systolic function is normal. The right ventricular  size is normal. There is normal pulmonary artery systolic pressure. The  estimated right ventricular systolic pressure is 43.3 mmHg.   3. The mitral valve is normal in structure. No evidence of mitral valve  regurgitation. No evidence of mitral stenosis.   4. The aortic valve is normal in structure. Aortic valve regurgitation is  not visualized. No aortic stenosis is present.   5. The inferior vena cava is normal in size with greater than 50%  respiratory variability, suggesting right atrial pressure of 3 mmHg.  EKG:  EKG is  not ordered today.    Recent Labs: 04/20/2021: ALT 9; NT-Pro BNP 262; TSH 1.480 08/20/2021: Hemoglobin 12.9; Platelets 213 11/18/2021: BUN 22; Creatinine, Ser 1.24; Potassium 4.8; Sodium 140  Recent Lipid  Panel    Component Value Date/Time   CHOL 171 11/15/2021 0915   TRIG 120 11/15/2021 0915   HDL 53 11/15/2021 0915   CHOLHDL 3.2 11/15/2021 0915   CHOLHDL 3.5 05/31/2013 0435   VLDL 28 05/31/2013 0435   LDLCALC 97 11/15/2021 0915   LDLDIRECT 111 (H) 02/10/2015 1145     Risk Assessment/Calculations:           Physical Exam:    VS:  BP (!) 100/56   Pulse 80   Ht 5' 1.5" (1.562 m)   Wt 121 lb 3.2 oz (55 kg)   LMP 08/01/2000 (Approximate)   SpO2 95%   BMI 22.53 kg/m     Wt Readings from Last 3 Encounters:  12/22/21 121 lb 3.2 oz (55 kg)  11/15/21 122 lb (55.3 kg)  10/19/21 123 lb 12.8 oz (56.2 kg)     GEN:  Well nourished, well developed in no acute distress HEENT: Normal NECK: No JVD; No carotid bruits LYMPHATICS: No lymphadenopathy CARDIAC: RRR, no murmurs, rubs, gallops RESPIRATORY:  Clear to auscultation without rales, wheezing or rhonchi  ABDOMEN: Soft, non-tender, non-distended MUSCULOSKELETAL:  No edema; No deformity  SKIN: Warm and dry NEUROLOGIC:  Alert and oriented x 3 PSYCHIATRIC:  Normal affect   ASSESSMENT:    1. Chronic systolic heart failure (HCC)   2. Stage 3a chronic kidney disease ([place])   3. ICD (implantable cardioverter-defibrillator), dual, in situ    PLAN:    In order of problems listed above:  The patient appears clinically stable at this time.  She has no functional limitation related to her cardiac disease.  Her most recent echo is reviewed which shows mild LV systolic dysfunction.  The patient's medical regimen includes a low-dose of losartan as well as metoprolol succinate and spironolactone.  I would like her to have follow-up in 6 months with an APP and I will plan to see her back in 1 year. Recent labs reviewed.  Creatinine is  1.24.  Stable findings from previous labs. No interval ICD discharges.  Followed by EP.     Medication Adjustments/Labs and Tests Ordered: Current medicines are reviewed at length with the patient today.  Concerns regarding medicines are outlined above.  No orders of the defined types were placed in this encounter.  No orders of the defined types were placed in this encounter.   Patient Instructions  Medication Instructions:  Your physician recommends that you continue on your current medications as directed. Please refer to the Current Medication list given to you today.  *If you need a refill on your cardiac medications before your next appointment, please call your pharmacy*   Lab Work: NONE If you have labs (blood work) drawn today and your tests are completely normal, you will receive your results only by: [place] (if you have MyChart) OR A paper copy in the mail If you have any lab test that is abnormal or we need to change your treatment, we will call you to review the results.   Testing/Procedures: NONE   Follow-Up: At [facility], you and your health needs are our priority.  As part of our continuing mission to provide you with exceptional heart care, we have created designated Provider Care Teams.  These Care Teams include your primary Cardiologist (physician) and Advanced Practice Providers (APPs -  Physician Assistants and Nurse Practitioners) who all work together to provide you with the care you need, when you need it.  We recommend signing up for  the patient portal called "MyChart".  Sign up information is provided on this After Visit Summary.  MyChart is used to connect with patients for Virtual Visits (Telemedicine).  Patients are able to view lab/test results, encounter notes, upcoming appointments, etc.  Non-urgent messages can be sent to your provider as well.   To learn more about what you can do with MyChart, go to [email or web address].    Your  next appointment:   6 month(s)  The format for your next appointment:   In Person  Provider:   Robbie Lis, PA-C, Christen Bame, NP, or Richardson Dopp, PA-C     Then, Sherren Mocha, MD will plan to see you again in 1 year(s).      Important Information About Sugar         Signed, Sherren Mocha, MD  12/22/2021 5:17 PM    [place]

## 2021-12-23 ENCOUNTER — Ambulatory Visit: Payer: Medicare Other | Admitting: Physical Therapy

## 2021-12-23 ENCOUNTER — Encounter: Payer: Self-pay | Admitting: Physical Therapy

## 2021-12-23 DIAGNOSIS — Z9181 History of falling: Secondary | ICD-10-CM | POA: Diagnosis not present

## 2021-12-23 DIAGNOSIS — R29818 Other symptoms and signs involving the nervous system: Secondary | ICD-10-CM | POA: Diagnosis not present

## 2021-12-23 DIAGNOSIS — R2681 Unsteadiness on feet: Secondary | ICD-10-CM

## 2021-12-23 DIAGNOSIS — R2689 Other abnormalities of gait and mobility: Secondary | ICD-10-CM

## 2021-12-23 DIAGNOSIS — R208 Other disturbances of skin sensation: Secondary | ICD-10-CM | POA: Diagnosis not present

## 2021-12-23 DIAGNOSIS — R278 Other lack of coordination: Secondary | ICD-10-CM | POA: Diagnosis not present

## 2021-12-23 NOTE — Therapy (Signed)
OUTPATIENT PHYSICAL THERAPY TREATMENT NOTE    Patient Name: Veronica Shaw MRN: [id number] DOB:1951/02/03, 71 y.o., female Today's Date: 12/23/2021  PCP: Katha Cabal, DO REFERRING PROVIDER: Vladimir Faster, DO  END OF SESSION:   PT End of Session - 12/23/21 0934     Visit Number 8    Number of Visits 17    Date for PT Re-Evaluation 02/01/22    Authorization Type Medicare    Progress Note Due on Visit 10    PT Start Time 0932    PT Stop Time 1012    PT Time Calculation (min) 40 min    Equipment Utilized During Treatment Gait belt    Activity Tolerance Patient tolerated treatment well;Patient limited by fatigue    Behavior During Therapy WFL for tasks assessed/performed                 Past Medical History:  Diagnosis Date   Acute on chronic systolic CHF (congestive heart failure), NYHA class 4 (HCC) 10/19/2020   Asthma    Back pain 09/18/2019   Cervical disc disorder with radiculopathy of cervical region 09/24/2018   Chronic systolic CHF (congestive heart failure) (HCC)    a. cMRI 4/15: EF 34% and findings - c/w NICM, normal RV size and function (RVEF 61%), Mild MR // b. Echo 2/15:  EF 30-35%, diff HK, ant-sept AK, Gr 2 DD, mild MR, trivial TR  //  c. Echo 5/17: EF 20-25%, severe diffuse HK, marked systolic dyssynchrony, grade 1 diastolic dysfunction, mild MR  //  d. RHC 5/17: Fick CO 2.9, RVSP 19, PASP 15, PW mean 2, low filing pressures and preserved CO    Cognitive impairment 10/19/2017   MOCA was administered with a score of 23/30   Diabetes mellitus    Diabetic peripheral neuropathy (HCC) 12/09/2011   Dyspnea 09/19/2012   Flu 10/17/2017   Gastritis    History of echocardiogram    Echo 6/18: EF 30-35, diffuse HK, grade 1 diastolic dysfunction, trivial MR, mild LAE, mild TR, no pericardial effusion   History of nuclear stress test    Myoview 5/18: EF 49, no ischemia, inferoseptal defect c/w LBBB artifact (intermediate risk due to EF < 50).   HTN (hypertension)     Hyperlipidemia    Hyperlipidemia associated with type 2 diabetes mellitus (HCC) 03/05/2019   Hypertension associated with diabetes (HCC) 09/28/2006   Qualifier: Diagnosis of  By: Abundio Miu     Major depression 03/31/2013   Melena 08/21/2020   NICM (nonischemic cardiomyopathy) (HCC)    a. Nuclear 5/13: Normal stress nuclear study. LV Ejection Fraction: 58%  //  b. LHC 10/14: Minor luminal irregularity in prox LAD, EF 35%    Parkinson disease (HCC)    Plantar fasciitis    Plantar fasciitis, bilateral 06/15/2012   Rosacea 05/29/2009   Qualifier: Diagnosis of  By: Constance Goltz MD, Marcie Mowers hearing loss (SNHL), bilateral 01/04/2017   Sleep apnea    was retested and no longer had it and so d/c CPAP   Syncope    Thoracic or lumbosacral neuritis or radiculitis, unspecified 07/03/2013   Tinnitus, bilateral 01/04/2017   Type II diabetes mellitus with complication (HCC) 09/28/2006   Diabetic eye exam done by [facility] Ophthalmology: Dr. Randon Goldsmith on 11/08/13: no diabetic retinopathy. Repeat in 1 year     Urticaria    Past Surgical History:  Procedure Laterality Date   BREAST EXCISIONAL BIOPSY Left 1970   benign cyst  BREAST SURGERY Left    BUNIONECTOMY     CARDIAC CATHETERIZATION N/A 12/16/2015   Procedure: Right Heart Cath;  Surgeon: Sherren Mocha, MD;  Location: [facility];  Service: Cardiovascular;  Laterality: N/A;   CHOLECYSTECTOMY     eye lid surgery Bilateral 05/09/2019   IMPLANTABLE CARDIOVERTER DEFIBRILLATOR IMPLANT  11-25-13   MDT dual chamber ICD implanted by [name] for primary prevention   IMPLANTABLE CARDIOVERTER DEFIBRILLATOR IMPLANT N/A 11/25/2013   Procedure: IMPLANTABLE CARDIOVERTER DEFIBRILLATOR IMPLANT;  Surgeon: [name], MD;  Location: [facility] C[facility] LAB;  Service: Cardiovascular;  Laterality: N/A;   LEFT AND RIGHT HEART CATHETERIZATION WITH CORONARY ANGIOGRAM N/A 05/31/2013   Procedure: LEFT AND RIGHT HEART CATHETERIZATION WITH CORONARY ANGIOGRAM;   Surgeon: [name], MD;  Location: [facility] CATH LAB;  Service: Cardiovascular;  Laterality: N/A;   TONSILLECTOMY     TUBAL LIGATION     Patient Active Problem List   Diagnosis Date Noted   Type 2 diabetes mellitus with diabetic polyneuropathy, without long-term current use of insulin ([place]) 09/06/2021   Type 2 diabetes mellitus with stage 3a chronic kidney disease, with long-term current use of insulin ([place]) 09/06/2021   Dyslipidemia 09/06/2021   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) 08/19/2021   Orthostatic hypotension 07/27/2021   Mood changes 07/27/2021   Weight loss 05/07/2021   Acute on chronic systolic CHF (congestive heart failure), NYHA class 4 ([place]) 10/19/2020   Chronic HFrEF (heart failure with reduced ejection fraction) ([place])    Cervical dystonia 10/07/2020   Advanced care planning/counseling discussion 08/21/2020   Dementia without behavioral disturbance ([place]) 08/21/2020   Vision impairment, Right Eye 08/21/2020   Chronic kidney disease, stage 3a ([place]) 08/18/2020   Chronic rhinitis 12/18/2019   Chronic respiratory failure with hypoxia ([place]) 12/18/2019   Hyperlipidemia associated with type 2 diabetes mellitus ([place]) 03/05/2019   Cervical radiculopathy 10/24/2018   Falls frequently    Asthma 08/03/2017   Osteopenia 07/10/2015   Mild persistent asthma in adult without complication 48/18/5631   NICM (nonischemic cardiomyopathy) ([place]) 04/23/2014   Arthritis or polyarthritis, rheumatoid ([place]) 03/12/2014   ICD (implantable cardioverter-defibrillator), dual, in situ 03/06/2014   Heart Failure with Recovered Ejection Fraction, G2DD 05/31/2013   Parkinson disease ([place]) 05/21/2013   Vertigo 03/03/2013   Gastroesophageal reflux disease without esophagitis 09/27/2012   Trigger point with neck pain 04/12/2012   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   Diabetic peripheral neuropathy ([place]) 12/09/2011   Chronic urticaria 05/27/2011   Sinus tachycardia ([place]) 05/27/2011    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 05/20/2011   Type II diabetes mellitus with complication ([place]) 49/70/2637   Hypertension associated with diabetes ([place]) 09/28/2006    REFERRING DIAG: G20 (ICD-10-CM) - Parkinson's disease ([place])   THERAPY DIAG:  Other symptoms and signs involving the nervous system  Other abnormalities of gait and mobility  Unsteadiness on feet  PERTINENT HISTORY: PMH: PD, chronic systolic CHF, diabetes, HTN, HLD, mild persistent asthma, cervical dystonia, chronic kidney disease stage III, peripheral neuropathy.     PRECAUTIONS: Fall, no driving, defibrillator   SUBJECTIVE: Had a rough night last night, did not sleep well. Has been working on her walking. Has not been using her rollator all the time.     PAIN:  Are you having pain? Yes NPRS scale: 6-7/10 Pain location: Neck, bilat knees PAIN TYPE: aching, sharp, and throbbing   TODAY'S TREATMENT:    GAIT: Gait pattern: step through pattern, decreased stride length, and narrow BOS Distance walked:  around clinic with session Assistive device utilized: Walker - 4 wheeled Level of assistance:  supervision  Comments: Initial cues for foot clearance.   BALANCE/NMR PWR Up in sitting to sit <> stand on air ex, performed x10 reps, cues for incr nose over toes and tucking feet under when performing sit <> stand, initial  bracing during 1st rep, but improved with incr reps and min postural sway in standing. Cues for tall posture in standing.  Retro gait at countertop, UE support > none, cues for hip hinge and incr step length when stepping backwards, down and back x2 reps, progressed difficulty by adding black tband resistance at pt's pelvis (PT tech providing resistance, PT providing min guard), cues for step length, pt initially with smaller steps and needing to use countertop as support, performed down and back x2 reps.  With PT tech providing resistance at pt's pelvis, performed lateral stepping strategy and reaching against  resistance x15 reps each side, with cues for weight shifting, opening hands when reaching and keeping space between feet when stepping back to midline. Pt initially losing her balance when stepping back to midline and needing to use hands for balance, but improved with incr reps.  In corner with feet together and EC on level ground; x20 seconds, 3  x 30 seconds. Pt needing to touch wall during first 2 reps, but improved with remainder. Pt with incr postural sway.       PATIENT EDUCATION: Education details: continue with current HEP and walking program. Importance of using rollator/RW at home for safety with gait.  Person educated: Patient Education method: Explanation Education comprehension: verbalized/demonstrated understanding     HOME EXERCISE PROGRAM: [id number], Seated PWR moves       GOALS: Goals reviewed with patient? Yes   SHORT TERM GOALS: Target date: 12/01/2021   Pt will be independent with initial HEP with family supervision in order to build upon functional gains made in therapy.   Baseline: Goal status: MET   2.  Pt will improve 5x sit<>stand to less than or equal to 33 sec without UE support to demonstrate improved functional strength and transfer efficiency.    Baseline:  39.65 without UE support; 25.41s without UE support  Goal status: MET   3.  Pt will improve gait speed with rollator to at least 1.3 ft/sec in order to demo improved household mobility/decr fall risk.    Baseline:  .98 ft/sec; 1.55 ft/s w/rollator  Goal status: MET   4.  Pt will perform condition 2 of mCTSIB for at least 30 sec in order to demo improved vestibular input for balance. Baseline: 19.6 sec; 30.34s w/S* Goal status: MET   5.  Pt will verbalize understanding of local Parkinson's disease resources, including options for community fitness. Baseline:  Goal status: PROGRESSING        LONG TERM GOALS: Target date: 12/29/2021   Pt will be independent with final HEP with family  supervision in order to build upon functional gains made in therapy. Baseline:  Goal status: INITIAL   2.  Pt will recover posterior and anterior balance in push and release test in 3 or less steps independently, for improved balance recovery.  Baseline: anterior = 5-6 small steps, posterior = multiple and needs assist from therapist to maintain balance. Goal status: INITIAL   3.  Pt will perform condition 4 of mCTSIB for at least 10 sec in order to demo improved vestibular input for balance. Baseline: 2 sec Goal status: INITIAL  4.  Pt will improve gait speed with rollator to at least 1.8 ft/sec in order to demo improved household mobility/decr fall risk.  Baseline: .98 ft/sec w/ rollator Goal status: INITIAL   5.  Pt will improve 5x sit<>stand to less than or equal to 20 sec without UE support to demonstrate improved functional strength and transfer efficiency.  Baseline: 39.65 sec without UE support Goal status: REVISED       ASSESSMENT:   CLINICAL IMPRESSION: Today's skilled session focused on balance strategies on unlevel surfaces, retro and lateral stepping, and balance with EC. Pt with incr postural sway on level ground with EC, indicating pt relies more on vision for her balance. Pt needing intermittent seated rest breaks due to fatigue and not sleeping well last night. Will continue to progress towards LTGs.    OBJECTIVE IMPAIRMENTS Abnormal gait, decreased activity tolerance, decreased balance, decreased coordination, decreased endurance, decreased knowledge of use of DME, difficulty walking, decreased strength, dizziness, impaired tone, postural dysfunction, and pain.    ACTIVITY LIMITATIONS cleaning, community activity, driving, meal prep, and laundry.    PERSONAL FACTORS Behavior pattern, Past/current experiences, Time since onset of injury/illness/exacerbation, and 3+ comorbidities: PD, chronic systolic CHF, diabetes, HTN, HLD, mild persistent asthma, cervical dystonia,  chronic kidney disease stage III, peripheral neuropathy  are also affecting patient's functional outcome.      REHAB POTENTIAL: Good   CLINICAL DECISION MAKING: Evolving/moderate complexity   EVALUATION COMPLEXITY: Moderate   PLAN: PT FREQUENCY: 2x/week   PT DURATION: 12 weeks   PLANNED INTERVENTIONS: Therapeutic exercises, Therapeutic activity, Neuromuscular re-education, Balance training, Gait training, Patient/Family education, Stair training, Vestibular training, Canalith repositioning, and DME instructions   PLAN FOR NEXT SESSION:  Check LTGs and update as appropriate.   Balance with vision removed, stepping strategies for balance, SLS.  Review Seated PWR moves as needed. SciFit/Nustep for strengthening/endurance. Potentially needs further vestibular assessment.  [name], PT, DPT 12/23/21 10:14 AM   Outpatient Neuro [facility] [address], [place], [place] [address] [phone] 12/23/21, 10:14 AM

## 2021-12-23 NOTE — Therapy (Incomplete)
OUTPATIENT OCCUPATIONAL THERAPY TREATMENT NOTE   Patient Name: Veronica Shaw MRN: [id number] DOB:Apr 04, 1951, 71 y.o., female Today's Date: 12/23/2021  PCP: Dr. Susa Simmonds REFERRING PROVIDER: Dr. Carles Collet   END OF SESSION:              Past Medical History:  Diagnosis Date   Acute on chronic systolic CHF (congestive heart failure), NYHA class 4 ([place]) 10/19/2020   Asthma    Back pain 09/18/2019   Cervical disc disorder with radiculopathy of cervical region 11/07/8117   Chronic systolic CHF (congestive heart failure) ([place])    a. cMRI 4/15: EF 34% and findings - c/w NICM, normal RV size and function (RVEF 61%), Mild MR // b. Echo 2/15:  EF 30-35%, diff HK, ant-sept AK, Gr 2 DD, mild MR, trivial TR  //  c. Echo 5/17: EF 20-25%, severe diffuse HK, marked systolic dyssynchrony, grade 1 diastolic dysfunction, mild MR  //  d. [place] 5/17: Fick CO 2.9, RVSP 19, PASP 15, PW mean 2, low filing pressures and preserved CO    Cognitive impairment 10/19/2017   MOCA was administered with a score of 23/30   Diabetes mellitus    Diabetic peripheral neuropathy ([place]) 12/09/2011   Dyspnea 09/19/2012   Flu 10/17/2017   Gastritis    History of echocardiogram    Echo 6/18: EF 30-35, diffuse HK, grade 1 diastolic dysfunction, trivial MR, mild LAE, mild TR, no pericardial effusion   History of nuclear stress test    Myov[place] 5/18: EF 49, no ischemia, inferoseptal defect c/w LBBB artifact (intermediate risk due to EF < 50).   HTN (hypertension)    Hyperlipidemia    Hyperlipidemia associated with type 2 diabetes mellitus ([place]) 03/05/2019   Hypertension associated with diabetes ([place]) 09/28/2006   Qualifier: Diagnosis of  By: [name]     Major depression 03/31/2013   Melena 08/21/2020   NICM (nonischemic cardiomyopathy) ([place])    a. Nuclear 5/13: Normal stress nuclear study. LV Ejection Fraction: 58%  //  b. LHC 10/14: Minor luminal irregularity in prox LAD, EF 35%    Parkinson disease (HCC)    Plantar  fasciitis    Plantar fasciitis, bilateral 06/15/2012   Rosacea 05/29/2009   Qualifier: Diagnosis of  By: [name] MD, [name] hearing loss (SNHL), bilateral 01/04/2017   Sleep apnea    was retested and no longer had it and so d/c CPAP   Syncope    Thoracic or lumbosacral neuritis or radiculitis, unspecified 07/03/2013   Tinnitus, bilateral 01/04/2017   Type II diabetes mellitus with complication ([place]) 1/47/8295   Diabetic eye exam done by [facility]: Dr. Prudencio Burly on 11/08/13: no diabetic retinopathy. Repeat in 1 year     Urticaria    Past Surgical History:  Procedure Laterality Date   BREAST EXCISIONAL BIOPSY Left 1970   benign cyst   BREAST SURGERY Left    BUNIONECTOMY     CARDIAC CATHETERIZATION N/A 12/16/2015   Procedure: Right Heart Cath;  Surgeon: Sherren Mocha, MD;  Location: [facility];  Service: Cardiovascular;  Laterality: N/A;   CHOLECYSTECTOMY     eye lid surgery Bilateral 05/09/2019   IMPLANTABLE CARDIOVERTER DEFIBRILLATOR IMPLANT  11-25-13   MDT dual chamber ICD implanted by [name] for primary prevention   IMPLANTABLE CARDIOVERTER DEFIBRILLATOR IMPLANT N/A 11/25/2013   Procedure: IMPLANTABLE CARDIOVERTER DEFIBRILLATOR IMPLANT;  Surgeon: [name], MD;  Location: [facility];  Service: Cardiovascular;  Laterality: N/A;  LEFT AND RIGHT HEART CATHETERIZATION WITH CORONARY ANGIOGRAM N/A 05/31/2013   Procedure: LEFT AND RIGHT HEART CATHETERIZATION WITH CORONARY ANGIOGRAM;  Surgeon: [name], MD;  Location: [facility] C[facility];  Service: Cardiovascular;  Laterality: N/A;   TONSILLECTOMY     TUBAL LIGATION     Patient Active Problem List   Diagnosis Date Noted   Type 2 diabetes mellitus with diabetic polyneuropathy, without long-term current use of insulin ([place]) 09/06/2021   Type 2 diabetes mellitus with stage 3a chronic kidney disease, with long-term current use of insulin ([place]) 09/06/2021   Dyslipidemia 09/06/2021   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) 08/19/2021   Orthostatic hypotension 07/27/2021   Mood changes 07/27/2021   Weight loss 05/07/2021   Acute on chronic systolic CHF (congestive heart failure), NYHA class 4 ([place]) 10/19/2020   Chronic HFrEF (heart failure with reduced ejection fraction) ([place])    Cervical dystonia 10/07/2020   Advanced care planning/counseling discussion 08/21/2020   Dementia without behavioral disturbance ([place]) 08/21/2020   Vision impairment, Right Eye 08/21/2020   Chronic kidney disease, stage 3a ([place]) 08/18/2020   Chronic rhinitis 12/18/2019   Chronic respiratory failure with hypoxia ([place]) 12/18/2019   Hyperlipidemia associated with type 2 diabetes mellitus ([place]) 03/05/2019   Cervical radiculopathy 10/24/2018   Falls frequently    Asthma 08/03/2017   Osteopenia 07/10/2015   Mild persistent asthma in adult without complication 96/28/3662   NICM (nonischemic cardiomyopathy) ([place]) 04/23/2014   Arthritis or polyarthritis, rheumatoid ([place]) 03/12/2014   ICD (implantable cardioverter-defibrillator), dual, in situ 03/06/2014   Heart Failure with Recovered Ejection Fraction, G2DD 05/31/2013   Parkinson disease ([place]) 05/21/2013   Vertigo 03/03/2013   Gastroesophageal reflux disease without esophagitis 09/27/2012   Trigger point with neck pain 04/12/2012   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   Diabetic peripheral neuropathy ([place]) 12/09/2011   Chronic urticaria 05/27/2011   Sinus tachycardia ([place]) 05/27/2011   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  Pt received PT in 2021 but no OT.   PMH is significant for:  PD, chronic systolic CHF, diabetes, HTN, HLD, mild persistent asthma, cervical dystonia, chronic kidney disease stage III, peripheral neuropathy, pacemaker / ICD  PRECAUTIONS: impaired sensation both hands due to neuropathy, pacemaker/ICD, fall risk   SUBJECTIVE: *** The pain is better  PAIN:  Are you having pain? Yes: NPRS scale: 6/10 Pain location: generalized, knees Pain description: aching Aggravating factors: malpositioning Relieving factors: repositioning     OBJECTIVE:   TODAY'S TREATMENT:  ***  Supine PWR! up x 10 reps with min cueing, closed-chain shoulder flex to approx 80* and chest press as able with min facilitation for RUE.  Sidelying, PWR! Twist (pone) with min facilitation and mod cueing/min facilitation for lower UE positioning, head turns, LE stabilization.  Sitting, PWR! Up with min cueing, then closed-chain shoulder flex to approx 90* with BUEs (foam noodle) with min facilitation/cues.  Rolling beside table forward/back with forward trunk  flex for closed-chain shoulder flex with min cueing.  Reviewed PWR! Hands basic 4, 10 reps each, min v.c, arms resting on arms of chair or table due to shoulder pain, min v.c  Reviewed flipping and dealing playing cards with min cueing for large amplitude movements   PATIENT EDUCATION: Education details: *** PWR! Up in sitting, closed-chain shoulder flex  Person educated: Patient Education method: Explanation, demonstration, verbal cues, handout  Education comprehension: verbalized understanding, returned demonstration     HOME EXERCISE PROGRAM: Basic Coordination, PWR! Hands 12/16/21:  Seated PWR! Up, closed-chain shoulder flex    ASSESSMENT:   CLINICAL IMPRESSION:  *** Pt is progressing  towards goals.  Pain and shoulder stiffness limit UE functional use, but pt demo improved movement after exercise.  PERFORMANCE DEFICITS in functional skills including ADLs, IADLs, coordination, dexterity, sensation, tone, ROM, strength, flexibility, FMC, GMC, mobility, balance, decreased knowledge of precautions, decreased knowledge of use of DME, vision, and UE functional use, cognitive skills including attention, memory, problem solving, safety awareness, thought, and understand, and psychosocial skills including coping strategies, environmental adaptation, and routines and behaviors.    IMPAIRMENTS are limiting patient from ADLs, IADLs, rest and sleep, play, leisure, and social participation.    COMORBIDITIES may have co-morbidities  that affects occupational performance. Patient will benefit from skilled OT to address above impairments and improve overall function.   MODIFICATION OR ASSISTANCE TO COMPLETE EVALUATION: Min-Moderate modification of tasks or assist with assess necessary to complete an evaluation.    OT OCCUPATIONAL PROFILE AND HISTORY: Detailed assessment: Review of records and additional review of physical, cognitive, psychosocial history related to current functional performance.   CLINICAL DECISION MAKING: Moderate - several treatment options, min-mod task modification necessary   REHAB POTENTIAL: Good   EVALUATION COMPLEXITY: Moderate       GOALS:     SHORT TERM GOALS: Target date: 12/29/2021   I with PD specific HEP   Goal status: INITIAL   2.  Pt will verbalize understanding of adapted strategies to maximize safety and I with ADLs/ IADLs .     Goal status: INITIAL   3.  Pt will demonstrate improved fine motor coordination for ADLs as evidenced by decreasing bilateral 9 hole peg test score by 5 secs     Baseline: RUE 71 secs, LUE 90 secs Goal status: INITIAL   4.  Pt will demonstrate improved ease with feeding as evidenced by decreasing PPT#2 (self  feeding) by 3 secs Baseline: 16.37 secs Goal status: INITIAL   5.  Pt will demonstrate improved bilateral UE functional use as evidenced by increasing bilateral box/ blocks score by 3 blocks. Baseline: RUE 31 blocks, 32 blocks Goal status: INITIAL   6. Pt will demonstrate understanding of memory compensations and ways to keep thinking skills sharp      Goal status: initial       LONG TERM GOALS: Target date: 02/23/2022   Pt will verbalize understanding of ways to prevent future PD related complications and PD community resources.   Goal status: INITIAL   2.  Pt will demonstrate ability to retrieve a lightweight object at 115 shoulder flexion and -35 elbow extension with RUE.   Baseline: 110, -40 Goal status: INITIAL   3.  Pt will demonstrate ability to retrieve a lightweight object at 90 shoulder flexion and -30 elbow extension with LUE   Baseline: 85, -30 Goal status: INITIAL   4.  Pt will demonstrate improved ease with dressing as evidenced by decreasing PPT#4, donn/ doff  jacket in 16 sec or less. Baseline: 19.53 Goal status: INITIAL,   5. Pt will demonstrate ability to complete 3 button/ unbutton in 1 min 40 secs or less. Baseline:  I min 47 secs Goal status: INITIAL       PLAN: OT FREQUENCY: 2x/week   OT DURATION: 12 weeks   PLANNED INTERVENTIONS: self care/ADL training, therapeutic exercise, therapeutic activity, neuromuscular re-education, manual therapy, passive range of motion, gait training, balance training, functional mobility training, splinting, ultrasound, paraffin, fluidotherapy, moist heat, cryotherapy, contrast bath, patient/family education, cognitive remediation/compensation, visual/perceptual remediation/compensation, energy conservation, coping strategies training, and DME and/or AE instructions   RECOMMENDED OTHER SERVICES: n/a   CONSULTED AND AGREED WITH PLAN OF CARE: Patient   PLAN FOR NEXT SESSION:   *** Review PWR! Up in sitting and  closed-chain shoulder flex; try PWR! Twist in sitting,  ADL strategies    Vianne Bulls, OTR/L [facility] [address] [place], [place]  [address] [phone] phone [phone] 12/23/21 12:24 PM

## 2021-12-27 ENCOUNTER — Encounter (HOSPITAL_COMMUNITY): Payer: Self-pay

## 2021-12-27 ENCOUNTER — Ambulatory Visit (HOSPITAL_COMMUNITY)
Admission: RE | Admit: 2021-12-27 | Discharge: 2021-12-27 | Disposition: A | Discharge status: Home / Self Care | Payer: Medicare Other | Source: Ambulatory Visit | Attending: Sports Medicine | Admitting: Sports Medicine

## 2021-12-27 ENCOUNTER — Ambulatory Visit (INDEPENDENT_AMBULATORY_CARE_PROVIDER_SITE_OTHER): Payer: Medicare Other

## 2021-12-27 VITALS — BP 105/68 | HR 79 | Temp 98.3°F | Resp 20

## 2021-12-27 DIAGNOSIS — M21612 Bunion of left foot: Secondary | ICD-10-CM

## 2021-12-27 DIAGNOSIS — M21619 Bunion of unspecified foot: Secondary | ICD-10-CM

## 2021-12-27 DIAGNOSIS — G20 Parkinson's disease: Secondary | ICD-10-CM

## 2021-12-27 DIAGNOSIS — M79675 Pain in left toe(s): Secondary | ICD-10-CM

## 2021-12-27 DIAGNOSIS — S93512A Sprain of interphalangeal joint of left great toe, initial encounter: Secondary | ICD-10-CM | POA: Diagnosis not present

## 2021-12-27 DIAGNOSIS — W19XXXA Unspecified fall, initial encounter: Secondary | ICD-10-CM | POA: Diagnosis not present

## 2021-12-27 NOTE — Discharge Instructions (Addendum)
There is no evidence of broken bones.  For the toe: -Ice the toe for 15-20 minutes a few times a day -May apply topical Voltaren gel over the toe twice per day -Wear good supportive shoe wear while this is still healing

## 2021-12-27 NOTE — ED Provider Notes (Signed)
[place]    CSN: [id number] Arrival date & time: 12/27/21  1110      History   Chief Complaint Chief Complaint  Patient presents with   Appointment    1130   Toe Injury    HPI Veronica Shaw is a 71 y.o. female here for left great toe pain.  HPI  Veronica Shaw states that she was ambulating yesterday when she tripped and fell and injured her left great toe.  She was able to walk after this, but did need help getting up.  She does state that she has Parkinson's disease and does fall quite often.  She ambulates with a walker.  She points to her pain over the IP joint of the great toe.  She does note that it has been painful to push on and to move the toe up and down.  She denies any gross swelling.  No break in the skin.  No redness or bruising over the toe.  Taking Tylenol arthritis and did take her last dose this morning.  Denies any history to this big toe before.  She denies hitting her head, no loss of consciousness.  No headache or dizziness currently.  Past Medical History:  Diagnosis Date   Acute on chronic systolic CHF (congestive heart failure), NYHA class 4 (HCC) 10/19/2020   Asthma    Back pain 09/18/2019   Cervical disc disorder with radiculopathy of cervical region 0/35/0093   Chronic systolic CHF (congestive heart failure) ([place])    a. cMRI 4/15: EF 34% and findings - c/w NICM, normal RV size and function (RVEF 61%), Mild MR // b. Echo 2/15:  EF 30-35%, diff HK, ant-sept AK, Gr 2 DD, mild MR, trivial TR  //  c. Echo 5/17: EF 20-25%, severe diffuse HK, marked systolic dyssynchrony, grade 1 diastolic dysfunction, mild MR  //  d. [place] 5/17: Fick CO 2.9, RVSP 19, PASP 15, PW mean 2, low filing pressures and preserved CO    Cognitive impairment 10/19/2017   MOCA was administered with a score of 23/30   Diabetes mellitus    Diabetic peripheral neuropathy ([place]) 12/09/2011   Dyspnea 09/19/2012   Flu 10/17/2017   Gastritis    History of echocardiogram    Echo 6/18: EF  30-35, diffuse HK, grade 1 diastolic dysfunction, trivial MR, mild LAE, mild TR, no pericardial effusion   History of nuclear stress test    Myoview 5/18: EF 49, no ischemia, inferoseptal defect c/w LBBB artifact (intermediate risk due to EF < 50).   HTN (hypertension)    Hyperlipidemia    Hyperlipidemia associated with type 2 diabetes mellitus ([place]) 03/05/2019   Hypertension associated with diabetes ([place]) 09/28/2006   Qualifier: Diagnosis of  By: [name]     Major depression 03/31/2013   Melena 08/21/2020   NICM (nonischemic cardiomyopathy) ([place])    a. Nuclear 5/13: Normal stress nuclear study. LV Ejection Fraction: 58%  //  b. LHC 10/14: Minor luminal irregularity in prox LAD, EF 35%    Parkinson disease (HCC)    Plantar fasciitis    Plantar fasciitis, bilateral 06/15/2012   Rosacea 05/29/2009   Qualifier: Diagnosis of  By: [name] MD, [name] hearing loss (SNHL), bilateral 01/04/2017   Sleep apnea    was retested and no longer had it and so d/c CPAP   Syncope    Thoracic or lumbosacral neuritis or radiculitis, unspecified 07/03/2013   Tinnitus, bilateral 01/04/2017   Type II  diabetes mellitus with complication ([place]) 09/04/5595   Diabetic eye exam done by [facility]: Dr. Prudencio Burly on 11/08/13: no diabetic retinopathy. 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) 08/19/2021   Orthostatic hypotension 07/27/2021   Mood changes 07/27/2021   Weight loss 05/07/2021   Acute on chronic systolic CHF (congestive heart failure), NYHA class 4 ([place]) 10/19/2020   Chronic HFrEF (heart failure with reduced ejection fraction)  ([place])    Cervical dystonia 10/07/2020   Advanced care planning/counseling discussion 08/21/2020   Dementia without behavioral disturbance ([name]) 08/21/2020   Vision impairment, Right Eye 08/21/2020   Chronic kidney disease, stage 3a ([place]) 08/18/2020   Chronic rhinitis 12/18/2019   Chronic respiratory failure with hypoxia ([place]) 12/18/2019   Hyperlipidemia associated with type 2 diabetes mellitus ([place]) 03/05/2019   Cervical radiculopathy 10/24/2018   Falls frequently    Asthma 08/03/2017   Osteopenia 07/10/2015   Mild persistent asthma in adult without complication 41/63/8453   NICM (nonischemic cardiomyopathy) ([place]) 04/23/2014   Arthritis or polyarthritis, rheumatoid ([place]) 03/12/2014   ICD (implantable cardioverter-defibrillator), dual, in situ 03/06/2014   Heart Failure with Recovered Ejection Fraction, G2DD 05/31/2013   Parkinson disease ([place]) 05/21/2013   Vertigo 03/03/2013   Gastroesophageal reflux disease without esophagitis 09/27/2012   Trigger point with neck pain 04/12/2012   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   Diabetic peripheral neuropathy ([place]) 12/09/2011   Chronic urticaria 05/27/2011   Sinus tachycardia ([place]) 05/27/2011   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 05/20/2011   Type II diabetes mellitus with complication ([place]) 64/68/0321   Hypertension associated with diabetes ([place]) 09/28/2006    Past Surgical History:  Procedure Laterality Date   BREAST EXCISIONAL BIOPSY Left 1970   benign cyst   BREAST SURGERY Left    BUNIONECTOMY     CARDIAC CATHETERIZATION N/A 12/16/2015   Procedure: Right Heart Cath;  Surgeon: Sherren Mocha, MD;  Location: [facility];  Service: Cardiovascular;  Laterality: N/A;   CHOLECYSTECTOMY     eye lid surgery Bilateral 05/09/2019   IMPLANTABLE CARDIOVERTER DEFIBRILLATOR IMPLANT  11-25-13   MDT dual chamber ICD implanted by [name] for primary prevention   IMPLANTABLE CARDIOVERTER DEFIBRILLATOR IMPLANT N/A 11/25/2013   Procedure: IMPLANTABLE  CARDIOVERTER DEFIBRILLATOR IMPLANT;  Surgeon: [name], MD;  Location[facility] C[facility] LAB;  Service: Cardiovascular;  Laterality: N/A;   LEFT AND RIGHT HEART CATHETERIZATION WITH CORONARY ANGIOGRAM N/A 05/31/2013   Procedure: LEFT AND RIGHT HEART CATHETERIZATION WITH CORONARY ANGIOGRAM;  Surgeon: [name], MD;  Location: [facility] CATH LAB;  Service: Cardiovascular;  Laterality: N/A;   TONSILLECTOMY     TUBAL LIGATION      OB History   No obstetric history on file.      Home Medications    Prior to Admission medications   Medication Sig Start Date End Date Taking? Authorizing Provider  APPLE CIDER VINEGAR PO Take 1 capsule by mouth daily.   Yes [provider]  Biotin 5000 MCG CAPS Take 1 tablet by mouth daily.    Yes [provider]  budesonide-formoterol (SYMBICORT) 160-4.5 MCG/ACT inhaler Inhale 2 puffs into the lungs 2 (two)  times daily. 10/24/19  Yes Lauraine Rinne, NP  carbidopa-levodopa (SINEMET CR) 50-200 MG tablet Take 1 tablet by mouth at bedtime. 10/20/21  Yes Tat, Eustace Quail, DO  carbidopa-levodopa (SINEMET IR) 25-100 MG tablet Take 1 tablet by mouth 3 (three) times daily. 10/19/21  Yes Tat, Eustace Quail, DO  diclofenac Sodium (VOLTAREN) 1 % GEL GIVE 4  TOPICALLY 4 TIMES DAILY 10/05/21  Yes Brimage, Vondra, DO  estradiol (ESTRACE) 0.1 MG/GM vaginal cream every other day. 10/27/21  Yes [provider]  famotidine (PEPCID) 20 MG tablet TAKE 1 TABLET TWICE DAILY 06/17/19  Yes Tammi Klippel, Sherin, DO  fluticasone (FLONASE SENSIMIST) 27.5 MCG/SPRAY nasal spray Place 2 sprays into the nose daily. 12/17/20  Yes Parrett, Tammy S, NP  furosemide (LASIX) 20 MG tablet Take 20 mg by mouth daily.   Yes [provider]  glipiZIDE (GLUCOTROL) 5 MG tablet Take 0.5 tablets (2.5 mg total) by mouth daily before breakfast. 09/06/21  Yes Shamleffer, Melanie Crazier, MD  losartan (COZAAR) 25 MG tablet Take 0.5 tablets (12.5 mg total) by mouth daily. 08/20/21  Yes Weaver, Scott T,  PA-C  melatonin 5 MG TABS Take 5 mg by mouth at bedtime.   Yes [provider]  metFORMIN (GLUCOPHAGE) 500 MG tablet Take 1 tablet (500 mg total) by mouth 2 (two) times daily with a meal. 09/06/21  Yes Shamleffer, Melanie Crazier, MD  metoprolol succinate (TOPROL-XL) 50 MG 24 hr tablet Take 1 tablet (50 mg total) by mouth 2 (two) times daily. 07/21/21  Yes [name], MD  mirtazapine (REMERON) 15 MG tablet Take 1 tablet (15 mg total) by mouth at bedtime. 10/20/21  Yes Tat, Eustace Quail, DO  OXYGEN Inhale into the lungs at bedtime. Inhale 2L into lungs   Yes [provider]  potassium chloride (KLOR-CON) 10 MEQ tablet Take 1 tablet (10 mEq total) by mouth daily. 11/04/21  Yes Sherren Mocha, MD  rosuvastatin (CRESTOR) 10 MG tablet Take 1 tablet (10 mg total) by mouth as directed. Three times a week 09/06/21  Yes Shamleffer, Melanie Crazier, MD  spironolactone (ALDACTONE) 25 MG tablet TAKE 1/2 TABLET EVERY DAY 05/05/21  Yes Sherren Mocha, MD  Ubiquinone (ULTRA COQ10 PO) Take 1 tablet by mouth 3 (three) times a week.   Yes [provider]  acetaminophen (TYLENOL) 500 MG tablet Take 2 tablets (1,000 mg total) by mouth every 8 (eight) hours as needed. 01/29/21   Brimage, Ronnette Juniper, DO  gabapentin (NEURONTIN) 100 MG capsule Take 100 mg by mouth daily. 09/03/21   [provider]  levalbuterol (XOPENEX) 0.63 MG/3ML nebulizer solution USE 1 VIAL IN NEBULIZER EVERY 4 TO 6 HOURS AS NEEDED FOR WHEEZING AND FOR SHORTNESS OF BREATH 10/14/20   Mannam, Praveen, MD  montelukast (SINGULAIR) 10 MG tablet Take 1 tablet (10 mg total) by mouth at bedtime. 06/01/21   Mannam, Hart Robinsons, MD  tiZANidine (ZANAFLEX) 2 MG tablet TAKE 1 TABLET BY MOUTH EVERY 8 HOURS AS NEEDED FOR MUSCLE SPASM 03/25/21   Lyndee Hensen, DO    Family History Family History  Problem Relation Age of Onset   Coronary artery disease Father        Died age 50   Heart attack Father    Diabetes Father    Coronary artery  disease Mother        Died age 80   Heart attack Mother    Diabetes Mother    Parkinson's disease Sister    Heart disease Sister  Hepatitis C Sister    Diabetes Sister    Diabetes Son 74       T1DM   Healthy Daughter    Diabetes Sister    Heart disease Sister    Diabetes Sister    Heart disease Sister    Breast cancer Neg Hx     Social History Social History   Tobacco Use   Smoking status: Never    Passive exposure: Past   Smokeless tobacco: Never   Tobacco comments:    exposed to smoke during child hood (parents)  Vaping Use   Vaping Use: Never used  Substance Use Topics   Alcohol use: No    Alcohol/week: 0.0 standard drinks   Drug use: No     Allergies   Aspirin, Penicillins, Prempro [conj estrog-medroxyprogest ace], Ace inhibitors, and Simvastatin   Review of Systems Review of Systems  Constitutional:  Negative for chills and fever.  Musculoskeletal:  Positive for arthralgias (left great toe pain) and gait problem.  Neurological:  Negative for dizziness and headaches.    Physical Exam Triage Vital Signs ED Triage Vitals [12/27/21 1148]  Enc Vitals Group     BP 105/68     Pulse Rate 79     Resp 20     Temp 98.3 F (36.8 C)     Temp Source Oral     SpO2 95 %     Weight      Height      Head Circumference      Peak Flow      Pain Score 5     Pain Loc      Pain Edu?      Excl. in [place]?    No data found.  Updated Vital Signs BP 105/68   Pulse 79   Temp 98.3 F (36.8 C) (Oral)   Resp 20   LMP 08/01/2000 (Approximate)   SpO2 95%    Physical Exam Gen: Well-appearing, in no acute distress; non-toxic CV: Well-perfused. Warm.  Resp: Breathing unlabored on room air; no wheezing. Psych: Fluid speech in conversation; appropriate affect; normal thought process Neuro: Sensation intact throughout. No gross coordination deficits.  MSK:  - Left foot: Inspection of the left foot does demonstrate a notable bunion deformity.  There is some positive  TTP over the IP joint of the dorsal great big toe.  There is no significant swelling or erythema.  There is limited active and passive range of motion secondary to pain, which is worse with extension of the big toe.  Cap refill less than 2 seconds.  There is hammertoe deformities of 2-3 toes.  Although no other metatarsal TTP nor phalangeal pain other than the great first toe.  Neurovascular intact distally.    UC Treatments / Results  Labs (all labs ordered are listed, but only abnormal results are displayed) Labs Reviewed - No data to display  EKG   Radiology DG Toe Great Left  Result Date: 12/27/2021 CLINICAL DATA:  Pain status post trip and fall yesterday during left great toe EXAM: LEFT GREAT TOE COMPARISON:  None Available. FINDINGS: Hallux valgus deformity of the great toe with bunion overlying the medial aspect of the head of the first metatarsal. Diffuse osteopenia. IMPRESSION: No acute fracture or dislocation of the left great toe. Electronically Signed   By: Miachel Roux M.D.   On: 12/27/2021 12:19    Procedures Procedures (including critical care time)  Medications Ordered in UC Medications - No data to display  Initial Impression / Assessment and Plan / UC Course  I have reviewed the triage vital signs and the nursing notes.  Pertinent labs & imaging results that were available during my care of the patient were reviewed by me and considered in my medical decision making (see chart for details).     Patient presents after a fall in which she injured the left great toe.  Wanted to make sure she did not break it.  X-ray does not demonstrate any acute fracture.  She does have a notable chronic bunion deformity and some general bone mineral density noted on x-ray.  Believe she likely had a sprain of the IP joint of the left great toe.  She may treat this with activity modification, ice as well as topical Voltaren gel.  We discussed the use of wearing supportive shoe wear with a  firmer insole while this heals.  She may follow-up with her PCP and follow-up or if this continues would recommend seeing sports medicine physician. Cannto rule out turf toe like injury today. Patient agreeable to plan. Safe for discharge home. Final Clinical Impressions(s) / UC Diagnoses   Final diagnoses:  Sprain of interphalangeal joint of left great toe, initial encounter  Fall, initial encounter  Parkinson disease ([place])  Bunion of great toe     Discharge Instructions      There is no evidence of broken bones.  For the toe: -Ice the toe for 15-20 minutes a few times a day -May apply topical Voltaren gel over the toe twice per day -Wear good supportive shoe wear while this is still healing     ED Prescriptions   None    PDMP not reviewed this encounter.   Elba Barman, DO 12/27/21 1325

## 2021-12-27 NOTE — ED Triage Notes (Signed)
Pt reports tripping and falling yesterday, injuring left great toe. Denies any other injuries. States pain is improved today, but wanted to make sure it's OK. Left great toe warm with prompt cap refill. Pt ambulates with walker.

## 2021-12-28 ENCOUNTER — Ambulatory Visit: Payer: Medicare Other | Admitting: Occupational Therapy

## 2021-12-28 ENCOUNTER — Ambulatory Visit: Payer: Medicare Other | Admitting: Physical Therapy

## 2021-12-30 ENCOUNTER — Ambulatory Visit: Payer: Medicare Other | Attending: Neurology | Admitting: Physical Therapy

## 2021-12-30 ENCOUNTER — Encounter: Payer: Self-pay | Admitting: Physical Therapy

## 2021-12-30 ENCOUNTER — Ambulatory Visit: Payer: Medicare Other | Admitting: Occupational Therapy

## 2021-12-30 DIAGNOSIS — R2689 Other abnormalities of gait and mobility: Secondary | ICD-10-CM | POA: Diagnosis not present

## 2021-12-30 DIAGNOSIS — M25611 Stiffness of right shoulder, not elsewhere classified: Secondary | ICD-10-CM | POA: Insufficient documentation

## 2021-12-30 DIAGNOSIS — R29818 Other symptoms and signs involving the nervous system: Secondary | ICD-10-CM | POA: Insufficient documentation

## 2021-12-30 DIAGNOSIS — R2681 Unsteadiness on feet: Secondary | ICD-10-CM | POA: Diagnosis not present

## 2021-12-30 DIAGNOSIS — R208 Other disturbances of skin sensation: Secondary | ICD-10-CM

## 2021-12-30 DIAGNOSIS — Z9181 History of falling: Secondary | ICD-10-CM | POA: Insufficient documentation

## 2021-12-30 DIAGNOSIS — M25621 Stiffness of right elbow, not elsewhere classified: Secondary | ICD-10-CM

## 2021-12-30 DIAGNOSIS — M25622 Stiffness of left elbow, not elsewhere classified: Secondary | ICD-10-CM

## 2021-12-30 DIAGNOSIS — R278 Other lack of coordination: Secondary | ICD-10-CM | POA: Insufficient documentation

## 2021-12-30 DIAGNOSIS — M25612 Stiffness of left shoulder, not elsewhere classified: Secondary | ICD-10-CM

## 2021-12-30 NOTE — Therapy (Signed)
OUTPATIENT OCCUPATIONAL THERAPY TREATMENT NOTE   Patient Name: Veronica Shaw MRN: [id number] DOB:11/06/1950, 71 y.o., female Today's Date: 12/30/2021  PCP: Dr. Susa Simmonds REFERRING PROVIDER: Dr. Carles Collet   END OF SESSION:   OT End of Session - 12/30/21 1022     Visit Number 5    Number of Visits 25    Date for OT Re-Evaluation 02/23/22    Authorization Type Medicare    Authorization Time Period POC written for 12 weeks, may d/c after 8 weeks    Authorization - Visit Number 5    Authorization - Number of Visits 10    OT Start Time 1019    OT Stop Time 1052    OT Time Calculation (min) 33 min    Activity Tolerance Patient tolerated treatment well    Behavior During Therapy WFL for tasks assessed/performed                       Past Medical History:  Diagnosis Date   Acute on chronic systolic CHF (congestive heart failure), NYHA class 4 ([place]) 10/19/2020   Asthma    Back pain 09/18/2019   Cervical disc disorder with radiculopathy of cervical region 01/21/6332   Chronic systolic CHF (congestive heart failure) ([place])    a. cMRI 4/15: EF 34% and findings - c/w NICM, normal RV size and function (RVEF 61%), Mild MR // b. Echo 2/15:  EF 30-35%, diff HK, ant-sept AK, Gr 2 DD, mild MR, trivial TR  //  c. Echo 5/17: EF 20-25%, severe diffuse HK, marked systolic dyssynchrony, grade 1 diastolic dysfunction, mild MR  //  d. [place] 5/17: Fick CO 2.9, RVSP 19, PASP 15, PW mean 2, low filing pressures and preserved CO    Cognitive impairment 10/19/2017   MOCA was administered with a score of 23/30   Diabetes mellitus    Diabetic peripheral neuropathy ([place]) 12/09/2011   Dyspnea 09/19/2012   Flu 10/17/2017   Gastritis    History of echocardiogram    Echo 6/18: EF 30-35, diffuse HK, grade 1 diastolic dysfunction, trivial MR, mild LAE, mild TR, no pericardial effusion   History of nuclear stress test    Myoview 5/18: EF 49, no ischemia, inferoseptal defect c/w LBBB artifact (intermediate risk  due to EF < 50).   HTN (hypertension)    Hyperlipidemia    Hyperlipidemia associated with type 2 diabetes mellitus ([place]) 03/05/2019   Hypertension associated with diabetes ([place]) 09/28/2006   Qualifier: Diagnosis of  By: [name]     Major depression 03/31/2013   Melena 08/21/2020   NICM (nonischemic cardiomyopathy) ([place])    a. Nuclear 5/13: Normal stress nuclear study. LV Ejection Fraction: 58%  //  b. LHC 10/14: Minor luminal irregularity in prox LAD, EF 35%    Parkinson disease (HCC)    Plantar fasciitis    Plantar fasciitis, bilateral 06/15/2012   Rosacea 05/29/2009   Qualifier: Diagnosis of  By: [name] MD, [name] hearing loss (SNHL), bilateral 01/04/2017   Sleep apnea    was retested and no longer had it and so d/c CPAP   Syncope    Thoracic or lumbosacral neuritis or radiculitis, unspecified 07/03/2013   Tinnitus, bilateral 01/04/2017   Type II diabetes mellitus with complication ([place]) 5/45/6256   Diabetic eye exam done by [facility]: Dr. Prudencio Burly on 11/08/13: no diabetic retinopathy. Repeat in 1 year     Urticaria    Past Surgical History:  Procedure Laterality Date   BREAST EXCISIONAL BIOPSY Left 1970   benign cyst   BREAST SURGERY Left    BUNIONECTOMY     CARDIAC CATHETERIZATION N/A 12/16/2015   Procedure: Right Heart Cath;  Surgeon: Sherren Mocha, MD;  Location: [facility];  Service: Cardiovascular;  Laterality: N/A;   CHOLECYSTECTOMY     eye lid surgery Bilateral 05/09/2019   IMPLANTABLE CARDIOVERTER DEFIBRILLATOR IMPLANT  11-25-13   MDT dual chamber ICD implanted by [name] for primary prevention   IMPLANTABLE CARDIOVERTER DEFIBRILLATOR IMPLANT N/A 11/25/2013   Procedure: IMPLANTABLE CARDIOVERTER DEFIBRILLATOR IMPLANT;  Surgeon: [name], MD;  Location: [facility] C[facility] LAB;  Service: Cardiovascular;  Laterality: N/A;   LEFT AND RIGHT HEART CATHETERIZATION WITH CORONARY ANGIOGRAM N/A 05/31/2013   Procedure: LEFT AND RIGHT HEART  CATHETERIZATION WITH CORONARY ANGIOGRAM;  Surgeon: [name], MD;  Location: [facility] CATH LAB;  Service: Cardiovascular;  Laterality: N/A;   TONSILLECTOMY     TUBAL LIGATION     Patient Active Problem List   Diagnosis Date Noted   Type 2 diabetes mellitus with diabetic polyneuropathy, without long-term current use of insulin ([place]) 09/06/2021   Type 2 diabetes mellitus with stage 3a chronic kidney disease, with long-term current use of insulin ([place]) 09/06/2021   Dyslipidemia 09/06/2021   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) 08/19/2021   Orthostatic hypotension 07/27/2021   Mood changes 07/27/2021   Weight loss 05/07/2021   Acute on chronic systolic CHF (congestive heart failure), NYHA class 4 ([place]) 10/19/2020   Chronic HFrEF (heart failure with reduced ejection fraction) ([place])    Cervical dystonia 10/07/2020   Advanced care planning/counseling discussion 08/21/2020   Dementia without behavioral disturbance ([place]) 08/21/2020   Vision impairment, Right Eye 08/21/2020   Chronic kidney disease, stage 3a ([place]) 08/18/2020   Chronic rhinitis 12/18/2019   Chronic respiratory failure with hypoxia ([place]) 12/18/2019   Hyperlipidemia associated with type 2 diabetes mellitus ([place]) 03/05/2019   Cervical radiculopathy 10/24/2018   Falls frequently    Asthma 08/03/2017   Osteopenia 07/10/2015   Mild persistent asthma in adult without complication 86/76/1950   NICM (nonischemic cardiomyopathy) ([place]) 04/23/2014   Arthritis or polyarthritis, rheumatoid ([place]) 03/12/2014   ICD (implantable cardioverter-defibrillator), dual, in situ 03/06/2014   Heart Failure with Recovered Ejection Fraction, G2DD 05/31/2013   Parkinson disease ([place]) 05/21/2013   Vertigo 03/03/2013   Gastroesophageal reflux disease without esophagitis 09/27/2012   Trigger point with neck pain 04/12/2012   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   Diabetic peripheral neuropathy ([place]) 12/09/2011   Chronic urticaria 05/27/2011    Sinus tachycardia ([place]) 05/27/2011   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 05/20/2011   Type II diabetes mellitus with complication ([place]) 93/26/7124   Hypertension associated with diabetes ([place]) 09/28/2006    ONSET DATE: 11/26/21   REFERRING DIAG: Parkinson's Disease  THERAPY DIAG:  Other symptoms and signs involving the nervous system  Other lack of coordination  Other disturbances of skin sensation  Stiffness of right shoulder, not elsewhere classified  Stiffness of left elbow, not elsewhere classified  Unsteadiness on feet  Other abnormalities of gait and mobility   PERTINENT HISTORY: Patient is a 71 year old female referred to [facility] for PD.  Pt received PT in 2021 but no OT.   PMH is significant for:  PD, chronic systolic CHF, diabetes, HTN, HLD, mild persistent asthma, cervical dystonia, chronic kidney disease stage III, peripheral neuropathy, pacemaker / ICD  PRECAUTIONS: impaired sensation both hands due to neuropathy,  pacemaker/ICD, fall risk   SUBJECTIVE: Pt requests to leave early today  PAIN:  Are you having pain? Yes: NPRS scale: 5/10 Pain location: generalized, knees, toe Pain description: aching Aggravating factors: malpositioning Relieving factors: repositioning     OBJECTIVE:   TODAY'S TREATMENT:  PWR! Moves seated x 10 reps each exercise, min-mod v.c, and demonstration for larger amplitude movements  Functional overhead reaching with left and right UE's to place graded clothespins on vertical antennae with left and right UE's, min vc. For elbow extension Therapist checked 9 hole peg test, pt met STG.  PATIENT EDUCATION: Education details: PWR!  Moves basic 4 in seated, min-mod v.c and demonstration Person educated: Patient Education method: Explanation, demonstration, verbal cues, handout  Education comprehension: verbalized understanding, returned demonstration     HOME EXERCISE PROGRAM: Basic Coordination, PWR! Hands 12/16/21:  Seated PWR! Up,  closed-chain shoulder flex    GOALS:     SHORT TERM GOALS: Target date: 12/29/2021   I with PD specific HEP   Goal status:  ongoing   2.  Pt will verbalize understanding of adapted strategies to maximize safety and I with ADLs/ IADLs .     Goal status:ongoing   3.  Pt will demonstrate improved fine motor coordination for ADLs as evidenced by decreasing bilateral 9 hole peg test score by 5 secs     Baseline: RUE 71 secs, LUE 90 secs Goal status: MET RUE 65, LUE 69 secs   4.  Pt will demonstrate improved ease with feeding as evidenced by decreasing PPT#2 (self feeding) by 3 secs Baseline: 16.37 secs Goal status: INITIAL   5.  Pt will demonstrate improved bilateral UE functional use as evidenced by increasing bilateral box/ blocks score by 3 blocks. Baseline: RUE 31 blocks, 32 blocks Goal status: INITIAL   6. Pt will demonstrate understanding of memory compensations and ways to keep thinking skills sharp      Goal status: initial       LONG TERM GOALS: Target date: 02/23/2022   Pt will verbalize understanding of ways to prevent future PD related complications and PD community resources.   Goal status: INITIAL   2.  Pt will demonstrate ability to retrieve a lightweight object at 115 shoulder flexion and -35 elbow extension with RUE.   Baseline: 110, -40 Goal status: INITIAL   3.  Pt will demonstrate ability to retrieve a lightweight object at 90 shoulder flexion and -30 elbow extension with LUE   Baseline: 85, -30 Goal status: INITIAL   4.  Pt will demonstrate improved ease with dressing as evidenced by decreasing PPT#4, donn/ doff jacket in 16 sec or less. Baseline: 19.53 Goal status: INITIAL,   5. Pt will demonstrate ability to complete 3 button/ unbutton in 1 min 40 secs or less. Baseline:  I min 47 secs Goal status: INITIAL          ASSESSMENT:   CLINICAL IMPRESSION:  Pt is progressing towards goals.  Pt is limited by pain and activity  tolerance.  PERFORMANCE DEFICITS in functional skills including ADLs, IADLs, coordination, dexterity, sensation, tone, ROM, strength, flexibility, FMC, GMC, mobility, balance, decreased knowledge of precautions, decreased knowledge of use of DME, vision, and UE functional use, cognitive skills including attention, memory, problem solving, safety awareness, thought, and understand, and psychosocial skills including coping strategies, environmental adaptation, and routines and behaviors.    IMPAIRMENTS are limiting patient from ADLs, IADLs, rest and sleep, play, leisure, and social participation.    COMORBIDITIES may have  co-morbidities  that affects occupational performance. Patient will benefit from skilled OT to address above impairments and improve overall function.   MODIFICATION OR ASSISTANCE TO COMPLETE EVALUATION: Min-Moderate modification of tasks or assist with assess necessary to complete an evaluation.    OT OCCUPATIONAL PROFILE AND HISTORY: Detailed assessment: Review of records and additional review of physical, cognitive, psychosocial history related to current functional performance.   CLINICAL DECISION MAKING: Moderate - several treatment options, min-mod task modification necessary   REHAB POTENTIAL: Good   EVALUATION COMPLEXITY: Moderate       PLAN: OT FREQUENCY: 2x/week   OT DURATION: 12 weeks   PLANNED INTERVENTIONS: self care/ADL training, therapeutic exercise, therapeutic activity, neuromuscular re-education, manual therapy, passive range of motion, gait training, balance training, functional mobility training, splinting, ultrasound, paraffin, fluidotherapy, moist heat, cryotherapy, contrast bath, patient/family education, cognitive remediation/compensation, visual/perceptual remediation/compensation, energy conservation, coping strategies training, and DME and/or AE instructions   RECOMMENDED OTHER SERVICES: n/a   CONSULTED AND AGREED WITH PLAN OF CARE: Patient    PLAN FOR NEXT SESSION: check goals, Review PWR! Seated , functional reaching, ADL strategies.    Theone Murdoch, OTR/L [phone] Phone: [phone] 1:33 PM 12/30/21  [phone] phone [phone] 12/30/21 10:59 AM

## 2021-12-30 NOTE — Therapy (Signed)
OUTPATIENT PHYSICAL THERAPY TREATMENT NOTE/10th VISIT PN   Patient Name: Veronica Shaw MRN: [id number] DOB:1950-09-02, 71 y.o., female Today's Date: 12/30/2021  PCP: Lyndee Hensen, DO REFERRING PROVIDER: Ludwig Clarks, DO  10th Visit Physical Therapy Progress Note  Dates of Reporting Period: 11/03/21 to 12/30/21   END OF SESSION:   PT End of Session - 12/30/21 0937     Visit Number 9    Number of Visits 17    Date for PT Re-Evaluation 02/01/22    Authorization Type Medicare    Progress Note Due on Visit 10   performed on visit 9   PT Start Time 0933    PT Stop Time 1014    PT Time Calculation (min) 41 min    Equipment Utilized During Treatment Gait belt    Activity Tolerance Patient tolerated treatment well    Behavior During Therapy WFL for tasks assessed/performed                 Past Medical History:  Diagnosis Date   Acute on chronic systolic CHF (congestive heart failure), NYHA class 4 ([place]) 10/19/2020   Asthma    Back pain 09/18/2019   Cervical disc disorder with radiculopathy of cervical region 8/92/1194   Chronic systolic CHF (congestive heart failure) ([place])    a. cMRI 4/15: EF 34% and findings - c/w NICM, normal RV size and function (RVEF 61%), Mild MR // b. Echo 2/15:  EF 30-35%, diff HK, ant-sept AK, Gr 2 DD, mild MR, trivial TR  //  c. Echo 5/17: EF 20-25%, severe diffuse HK, marked systolic dyssynchrony, grade 1 diastolic dysfunction, mild MR  //  d. [place] 5/17: Fick CO 2.9, RVSP 19, PASP 15, PW mean 2, low filing pressures and preserved CO    Cognitive impairment 10/19/2017   MOCA was administered with a score of 23/30   Diabetes mellitus    Diabetic peripheral neuropathy ([place]) 12/09/2011   Dyspnea 09/19/2012   Flu 10/17/2017   Gastritis    History of echocardiogram    Echo 6/18: EF 30-35, diffuse HK, grade 1 diastolic dysfunction, trivial MR, mild LAE, mild TR, no pericardial effusion   History of nuclear stress test    Myoview 5/18: EF 49, no  ischemia, inferoseptal defect c/w LBBB artifact (intermediate risk due to EF < 50).   HTN (hypertension)    Hyperlipidemia    Hyperlipidemia associated with type 2 diabetes mellitus ([place]) 03/05/2019   Hypertension associated with diabetes ([place]) 09/28/2006   Qualifier: Diagnosis of  By: [name]     Major depression 03/31/2013   Melena 08/21/2020   NICM (nonischemic cardiomyopathy) ([place])    a. Nuclear 5/13: Normal stress nuclear study. LV Ejection Fraction: 58%  //  b. LHC 10/14: Minor luminal irregularity in prox LAD, EF 35%    Parkinson disease (HCC)    Plantar fasciitis    Plantar fasciitis, bilateral 06/15/2012   Rosacea 05/29/2009   Qualifier: Diagnosis of  By: [name] MD, [name] hearing loss (SNHL), bilateral 01/04/2017   Sleep apnea    was retested and no longer had it and so d/c CPAP   Syncope    Thoracic or lumbosacral neuritis or radiculitis, unspecified 07/03/2013   Tinnitus, bilateral 01/04/2017   Type II diabetes mellitus with complication ([place]) 1/74/0814   Diabetic eye exam done by [facility]: Dr. Prudencio Burly on 11/08/13: no diabetic retinopathy. Repeat in 1 year     Urticaria    Past  Surgical History:  Procedure Laterality Date   BREAST EXCISIONAL BIOPSY Left 1970   benign cyst   BREAST SURGERY Left    BUNIONECTOMY     CARDIAC CATHETERIZATION N/A 12/16/2015   Procedure: Right Heart Cath;  Surgeon: Sherren Mocha, MD;  Location: [facility];  Service: Cardiovascular;  Laterality: N/A;   CHOLECYSTECTOMY     eye lid surgery Bilateral 05/09/2019   IMPLANTABLE CARDIOVERTER DEFIBRILLATOR IMPLANT  11-25-13   MDT dual chamber ICD implanted by [name] for primary prevention   IMPLANTABLE CARDIOVERTER DEFIBRILLATOR IMPLANT N/A 11/25/2013   Procedure: IMPLANTABLE CARDIOVERTER DEFIBRILLATOR IMPLANT;  Surgeon: [name], MD;  Location: [facility] CATH LAB;  Service: Cardiovascular;  Laterality: N/A;   LEFT AND RIGHT HEART CATHETERIZATION WITH CORONARY  ANGIOGRAM N/A 05/31/2013   Procedure: LEFT AND RIGHT HEART CATHETERIZATION WITH CORONARY ANGIOGRAM;  Surgeon: [name], MD;  Location: [facility] LAB;  Service: Cardiovascular;  Laterality: N/A;   TONSILLECTOMY     TUBAL LIGATION     Patient Active Problem List   Diagnosis Date Noted   Type 2 diabetes mellitus with diabetic polyneuropathy, without long-term current use of insulin ([place]) 09/06/2021   Type 2 diabetes mellitus with stage 3a chronic kidney disease, with long-term current use of insulin ([place]) 09/06/2021   Dyslipidemia 09/06/2021   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) 08/19/2021   Orthostatic hypotension 07/27/2021   Mood changes 07/27/2021   Weight loss 05/07/2021   Acute on chronic systolic CHF (congestive heart failure), NYHA class 4 ([place]) 10/19/2020   Chronic HFrEF (heart failure with reduced ejection fraction) ([place])    Cervical dystonia 10/07/2020   Advanced care planning/counseling discussion 08/21/2020   Dementia without behavioral disturbance ([place]) 08/21/2020   Vision impairment, Right Eye 08/21/2020   Chronic kidney disease, stage 3a ([place]) 08/18/2020   Chronic rhinitis 12/18/2019   Chronic respiratory failure with hypoxia ([place]) 12/18/2019   Hyperlipidemia associated with type 2 diabetes mellitus ([place]) 03/05/2019   Cervical radiculopathy 10/24/2018   Falls frequently    Asthma 08/03/2017   Osteopenia 07/10/2015   Mild persistent asthma in adult without complication 32/07/2481   NICM (nonischemic cardiomyopathy) ([place]) 04/23/2014   Arthritis or polyarthritis, rheumatoid ([place]) 03/12/2014   ICD (implantable cardioverter-defibrillator), dual, in situ 03/06/2014   Heart Failure with Recovered Ejection Fraction, G2DD 05/31/2013   Parkinson disease ([place]) 05/21/2013   Vertigo 03/03/2013   Gastroesophageal reflux disease without esophagitis 09/27/2012   Trigger point with neck pain 04/12/2012   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   Diabetic peripheral  neuropathy ([place]) 12/09/2011   Chronic urticaria 05/27/2011   Sinus tachycardia ([place]) 05/27/2011   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 05/20/2011   Type II diabetes mellitus with complication ([place]) 50/09/7046   Hypertension associated with diabetes ([place]) 09/28/2006    REFERRING DIAG: G20 (ICD-10-CM) - Parkinson's disease ([place])   THERAPY DIAG:  Other symptoms and signs involving the nervous system  Other abnormalities of gait and mobility  Unsteadiness on feet  PERTINENT HISTORY: PMH: PD, chronic systolic CHF, diabetes, HTN, HLD, mild persistent asthma, cervical dystonia, chronic kidney disease stage III, peripheral neuropathy.     PRECAUTIONS: Fall, no driving, defibrillator   SUBJECTIVE: Had a fall - was on the floor for one hour and did not have her phone on her. Was able to get up on her own. Went to her recliner and was able to push up.Was shuffling her feet and did not have an AD. Went to the ER- found a sprain  of the IP joint of her L great toe. Pt has been icing it and reports it is getting better.     PAIN:  Are you having pain? Yes NPRS scale: 4-5/10 Pain location: Neck, bilat knees, arthritic pain  PAIN TYPE: aching, sharp, and throbbing    TODAY'S TREATMENT:  Self-Care: Discussed fall prevention - having pt to look into getting a life alert if no one is around pt to help get her up. Pt will discuss this with her family as they are also looking into getting her some [employer] at home for pt to call out to have [place] call her family or call 911. Importance of using RW/ROLLATOR AT ALL TIMES (even in the house). In order for improved gait mechanics and safety. Continued to stress this with pt as she has had falls when using no AD.  Provided handout on fall prevention to discuss with her family.    Goal Assessment: 5x sit <> stand with no UE support: 29.12 seconds  Gait speed with rollator: 27.22 seconds = 1.20 ft/sec  mCTSIB: conditions 1- 3 = 30 seconds, mild postural sway with  condition 2, condition 4 = 9.8 seconds, needing assist from therapist for balance Push and release test, anterior = 1 step, posterior = 2-3 steps  GAIT: Gait pattern: step through pattern, decreased stride length, and narrow BOS Distance walked:  around clinic with session Assistive device utilized: Walker - 4 wheeled Level of assistance:  supervision  Comments: Initial cues for foot clearance.         PATIENT EDUCATION: Education details: See Self-Care, results of goals, areas to continue to keep working on in therapy, importance of performing HEP/walking program.  Person educated: Patient Education method: Explanation Education comprehension: verbalized/demonstrated understanding     HOME EXERCISE PROGRAM: [id number], Seated PWR moves, Walking program with rollator        GOALS: Goals reviewed with patient? Yes   SHORT TERM GOALS: Target date: 12/01/2021   Pt will be independent with initial HEP with family supervision in order to build upon functional gains made in therapy.   Baseline: Goal status: MET   2.  Pt will improve 5x sit<>stand to less than or equal to 33 sec without UE support to demonstrate improved functional strength and transfer efficiency.    Baseline:  39.65 without UE support; 25.41s without UE support  Goal status: MET   3.  Pt will improve gait speed with rollator to at least 1.3 ft/sec in order to demo improved household mobility/decr fall risk.    Baseline:  .98 ft/sec; 1.55 ft/s w/rollator  Goal status: MET   4.  Pt will perform condition 2 of mCTSIB for at least 30 sec in order to demo improved vestibular input for balance. Baseline: 19.6 sec; 30.34s w/S* Goal status: MET   5.  Pt will verbalize understanding of local Parkinson's disease resources, including options for community fitness. Baseline:  Goal status: PROGRESSING        LONG TERM GOALS: Target date: 12/29/2021   Pt will be independent with final HEP with family supervision in  order to build upon functional gains made in therapy. Baseline:  Goal status: ON-GOING    2.  Pt will recover posterior and anterior balance in push and release test in 3 or less steps independently, for improved balance recovery.  Baseline: anterior = 5-6 small steps, posterior = multiple and needs assist from therapist to maintain balance.  On 12/30/21, anterior = 1 step, posterior=  2-3 steps Goal status: MET    3.  Pt will perform condition 4 of mCTSIB for at least 10 sec in order to demo improved vestibular input for balance. Baseline: 2 sec; 9.7 seconds on 12/30/21 Goal status: PARTIALLY MET   4.  Pt will improve gait speed with rollator to at least 1.8 ft/sec in order to demo improved household mobility/decr fall risk.  Baseline: .98 ft/sec w/ rollator  27.22 seconds = 1.20 ft/sec on 12/30/21 Goal status: NOT MET    5.  Pt will improve 5x sit<>stand to less than or equal to 20 sec without UE support to demonstrate improved functional strength and transfer efficiency.  Baseline: 39.65 sec without UE support  29.12 seconds on 12/30/21 Goal status: NOT MET   Updated/ongoing LTGs for 12 week POC  LONG TERM GOALS: Target date: 02/01/2022   Pt will be independent with final HEP with family supervision in order to build upon functional gains made in therapy. Baseline:  Goal status: PROGRESSING   2.  Pt will recover posterior balance in push and release test in 1-2 or less steps independently, for improved balance recovery.  Baseline: anterior = 5-6 small steps, posterior = multiple and needs assist from therapist to maintain balance.  On 12/30/21, anterior = 1 step, posterior= 2-3 steps Goal status: REVISED    3.  Pt will perform condition 4 of mCTSIB for at least 13 sec in order to demo improved vestibular input for balance. Baseline: 2 sec; 9.7 seconds on 12/30/21 Goal status: REVISED   4.  Pt will improve gait speed with rollator to at least 1.5 ft/sec in order to demo improved  household mobility/decr fall risk.  Baseline: .98 ft/sec w/ rollator  27.22 seconds = 1.20 ft/sec on 12/30/21 Goal status: REVISED   5.  Pt will improve 5x sit<>stand to less than or equal to 25 sec without UE support to demonstrate improved functional strength and transfer efficiency.  Baseline: 39.65 sec without UE support  29.12 seconds on 12/30/21 Goal status: REVISED   ASSESSMENT:   CLINICAL IMPRESSION: 10th visit PN: Assessed pt's LTGs today with pt not meeting 2 out of 5 LTGs in regards to gait speed and 5x sit <> stand. Pt has had slowed measures compared to when last assessed at [facility] date. Pt partially met LTG #3, improved condition 4 time of mCTSIB for vestibular input to ~9 seconds (previously was only 2 seconds). Pt met LTG #2, had a significant improvement in anterior/posterior push and release test with pt able to maintain her balance (previously in posterior direction therapist had to prevent pt from falling). Continued to re-iterate importance of using rollator/RW at all times esp in the house to decr fall risk and improve safety (pt had another fall when she was not using any AD). LTGs updated/revised as appropriate for 12 week POC. Will continue to progress towards LTGs.     OBJECTIVE IMPAIRMENTS Abnormal gait, decreased activity tolerance, decreased balance, decreased coordination, decreased endurance, decreased knowledge of use of DME, difficulty walking, decreased strength, dizziness, impaired tone, postural dysfunction, and pain.    ACTIVITY LIMITATIONS cleaning, community activity, driving, meal prep, and laundry.    PERSONAL FACTORS Behavior pattern, Past/current experiences, Time since onset of injury/illness/exacerbation, and 3+ comorbidities: PD, chronic systolic CHF, diabetes, HTN, HLD, mild persistent asthma, cervical dystonia, chronic kidney disease stage III, peripheral neuropathy  are also affecting patient's functional outcome.      REHAB POTENTIAL: Good    CLINICAL DECISION MAKING: Evolving/moderate  complexity   EVALUATION COMPLEXITY: Moderate   PLAN: PT FREQUENCY: 2x/week   PT DURATION: 12 weeks   PLANNED INTERVENTIONS: Therapeutic exercises, Therapeutic activity, Neuromuscular re-education, Balance training, Gait training, Patient/Family education, Stair training, Vestibular training, Canal[place] repositioning, and DME instructions   PLAN FOR NEXT SESSION:  Perform vestibular assessment - just to see if anything else is going on? Review and update pt's HEP as needed. Balance with vision removed, stepping strategies for balance, SLS.  SciFit/Nustep for strengthening/endurance.  Continue to re-iterate using rollator/RW at all times for balance.    [name], PT, DPT 12/30/21 10:17 AM   [facility] [address] [place], [place] [address] [phone] 12/30/21, 10:17 AM

## 2022-01-04 ENCOUNTER — Encounter: Payer: Self-pay | Admitting: Occupational Therapy

## 2022-01-04 ENCOUNTER — Ambulatory Visit: Payer: Medicare Other | Admitting: Occupational Therapy

## 2022-01-04 ENCOUNTER — Encounter: Payer: Self-pay | Admitting: *Deleted

## 2022-01-04 ENCOUNTER — Ambulatory Visit: Payer: Medicare Other | Admitting: Physical Therapy

## 2022-01-04 DIAGNOSIS — R2681 Unsteadiness on feet: Secondary | ICD-10-CM

## 2022-01-04 DIAGNOSIS — R2689 Other abnormalities of gait and mobility: Secondary | ICD-10-CM | POA: Diagnosis not present

## 2022-01-04 DIAGNOSIS — R278 Other lack of coordination: Secondary | ICD-10-CM

## 2022-01-04 DIAGNOSIS — R29818 Other symptoms and signs involving the nervous system: Secondary | ICD-10-CM | POA: Diagnosis not present

## 2022-01-04 DIAGNOSIS — Z9181 History of falling: Secondary | ICD-10-CM

## 2022-01-04 DIAGNOSIS — M25621 Stiffness of right elbow, not elsewhere classified: Secondary | ICD-10-CM

## 2022-01-04 DIAGNOSIS — R208 Other disturbances of skin sensation: Secondary | ICD-10-CM | POA: Diagnosis not present

## 2022-01-04 DIAGNOSIS — M25611 Stiffness of right shoulder, not elsewhere classified: Secondary | ICD-10-CM

## 2022-01-04 DIAGNOSIS — M25622 Stiffness of left elbow, not elsewhere classified: Secondary | ICD-10-CM

## 2022-01-04 DIAGNOSIS — M25612 Stiffness of left shoulder, not elsewhere classified: Secondary | ICD-10-CM

## 2022-01-04 NOTE — Patient Instructions (Signed)
Keep cane low, only work in pain free ranges    SCAPULA: Retraction    Hold cane with both hands. Pinch shoulder blades together. Do not shrug shoulders. Hold _5__ seconds.  _10__ reps per set, __1_ sets per day, _7__ days per week  Copyright  VHI. All rights reserved.     SHOULDER: Flexion - Sitting    Hold cane with both hands. Raise arms up. Keep elbows straight. Hold __3_ seconds. No weight __10_ reps per set, _1__ sets per day, __7_ days per week  Copyright  VHI. All rights reserved.

## 2022-01-04 NOTE — Therapy (Signed)
OUTPATIENT OCCUPATIONAL THERAPY TREATMENT NOTE   Patient Name: Veronica Shaw MRN: [id number] DOB:01-Jul-1951, 71 y.o., female Today's Date: 01/04/2022  PCP: Dr. Susa Simmonds REFERRING PROVIDER: Dr. Carles Collet   END OF SESSION:   OT End of Session - 01/04/22 1020     Visit Number 6    Number of Visits 25    Date for OT Re-Evaluation 02/23/22    Authorization Type Medicare    Authorization Time Period POC written for 12 weeks, may d/c after 8 weeks    Authorization - Visit Number 6    Authorization - Number of Visits 10    Progress Note Due on Visit 10    OT Start Time 1019    OT Stop Time 1100    OT Time Calculation (min) 41 min    Activity Tolerance Patient tolerated treatment well    Behavior During Therapy WFL for tasks assessed/performed                       Past Medical History:  Diagnosis Date   Acute on chronic systolic CHF (congestive heart failure), NYHA class 4 ([place]) 10/19/2020   Asthma    Back pain 09/18/2019   Cervical disc disorder with radiculopathy of cervical region 01/26/3150   Chronic systolic CHF (congestive heart failure) ([place])    a. cMRI 4/15: EF 34% and findings - c/w NICM, normal RV size and function (RVEF 61%), Mild MR // b. Echo 2/15:  EF 30-35%, diff HK, ant-sept AK, Gr 2 DD, mild MR, trivial TR  //  c. Echo 5/17: EF 20-25%, severe diffuse HK, marked systolic dyssynchrony, grade 1 diastolic dysfunction, mild MR  //  d. Superior 5/17: Fick CO 2.9, RVSP 19, PASP 15, PW mean 2, low filing pressures and preserved CO    Cognitive impairment 10/19/2017   MOCA was administered with a score of 23/30   Diabetes mellitus    Diabetic peripheral neuropathy ([place]) 12/09/2011   Dyspnea 09/19/2012   Flu 10/17/2017   Gastritis    History of echocardiogram    Echo 6/18: EF 30-35, diffuse HK, grade 1 diastolic dysfunction, trivial MR, mild LAE, mild TR, no pericardial effusion   History of nuclear stress test    Myoview 5/18: EF 49, no ischemia, inferoseptal defect  c/w LBBB artifact (intermediate risk due to EF < 50).   HTN (hypertension)    Hyperlipidemia    Hyperlipidemia associated with type 2 diabetes mellitus ([place]) 03/05/2019   Hypertension associated with diabetes ([place]) 09/28/2006   Qualifier: Diagnosis of  By: [name]     Major depression 03/31/2013   Melena 08/21/2020   NICM (nonischemic cardiomyopathy) ([place])    a. Nuclear 5/13: Normal stress nuclear study. LV Ejection Fraction: 58%  //  b. LHC 10/14: Minor luminal irregularity in prox LAD, EF 35%    Parkinson disease (HCC)    Plantar fasciitis    Plantar fasciitis, bilateral 06/15/2012   Rosacea 05/29/2009   Qualifier: Diagnosis of  By: [name] MD, [name] hearing loss (SNHL), bilateral 01/04/2017   Sleep apnea    was retested and no longer had it and so d/c CPAP   Syncope    Thoracic or lumbosacral neuritis or radiculitis, unspecified 07/03/2013   Tinnitus, bilateral 01/04/2017   Type II diabetes mellitus with complication ([place]) 7/61/6073   Diabetic eye exam done by [facility]: Dr. Prudencio Burly on 11/08/13: no diabetic retinopathy. Repeat in 1 year  Urticaria    Past Surgical History:  Procedure Laterality Date   BREAST EXCISIONAL BIOPSY Left 1970   benign cyst   BREAST SURGERY Left    BUNIONECTOMY     CARDIAC CATHETERIZATION N/A 12/16/2015   Procedure: Right Heart Cath;  Surgeon: Sherren Mocha, MD;  Location: [place] [facility];  Service: Cardiovascular;  Laterality: N/A;   CHOLECYSTECTOMY     eye lid surgery Bilateral 05/09/2019   IMPLANTABLE CARDIOVERTER DEFIBRILLATOR IMPLANT  11-25-13   MDT dual chamber ICD implanted by [name] for primary prevention   IMPLANTABLE CARDIOVERTER DEFIBRILLATOR IMPLANT N/A 11/25/2013   Procedure: IMPLANTABLE CARDIOVERTER DEFIBRILLATOR IMPLANT;  Surgeon: [name], MD;  Location: [facility] CATH LAB;  Service: Cardiovascular;  Laterality: N/A;   LEFT AND RIGHT HEART CATHETERIZATION WITH CORONARY ANGIOGRAM N/A 05/31/2013    Procedure: LEFT AND RIGHT HEART CATHETERIZATION WITH CORONARY ANGIOGRAM;  Surgeon: [name], MD;  Location: [facility] CATH LAB;  Service: Cardiovascular;  Laterality: N/A;   TONSILLECTOMY     TUBAL LIGATION     Patient Active Problem List   Diagnosis Date Noted   Type 2 diabetes mellitus with diabetic polyneuropathy, without long-term current use of insulin ([place]) 09/06/2021   Type 2 diabetes mellitus with stage 3a chronic kidney disease, with long-term current use of insulin ([place]) 09/06/2021   Dyslipidemia 09/06/2021   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) 08/19/2021   Orthostatic hypotension 07/27/2021   Mood changes 07/27/2021   Weight loss 05/07/2021   Acute on chronic systolic CHF (congestive heart failure), NYHA class 4 ([place]) 10/19/2020   Chronic HFrEF (heart failure with reduced ejection fraction) ([place])    Cervical dystonia 10/07/2020   Advanced care planning/counseling discussion 08/21/2020   Dementia without behavioral disturbance ([place]) 08/21/2020   Vision impairment, Right Eye 08/21/2020   Chronic kidney disease, stage 3a ([place]) 08/18/2020   Chronic rhinitis 12/18/2019   Chronic respiratory failure with hypoxia ([place]) 12/18/2019   Hyperlipidemia associated with type 2 diabetes mellitus ([place]) 03/05/2019   Cervical radiculopathy 10/24/2018   Falls frequently    Asthma 08/03/2017   Osteopenia 07/10/2015   Mild persistent asthma in adult without complication 16/08/930   NICM (nonischemic cardiomyopathy) ([place]) 04/23/2014   Arthritis or polyarthritis, rheumatoid ([place]) 03/12/2014   ICD (implantable cardioverter-defibrillator), dual, in situ 03/06/2014   Heart Failure with Recovered Ejection Fraction, G2DD 05/31/2013   Parkinson disease ([place]) 05/21/2013   Vertigo 03/03/2013   Gastroesophageal reflux disease without esophagitis 09/27/2012   Trigger point with neck pain 04/12/2012   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   Diabetic peripheral neuropathy ([place]) 12/09/2011    Chronic urticaria 05/27/2011   Sinus tachycardia ([place]) 05/27/2011   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  Pt received PT in 2021 but no OT.   PMH is significant for:  PD, chronic systolic CHF, diabetes, HTN, HLD, mild persistent asthma, cervical dystonia, chronic kidney disease stage III, peripheral neuropathy, pacemaker / ICD  PRECAUTIONS: impaired sensation both hands due to neuropathy, pacemaker/ICD, fall risk   SUBJECTIVE: Pt requests to leave early today  PAIN:  Are you having pain? Yes: NPRS scale: 5/10 Pain location: generalized, knees, toe Pain description: aching Aggravating factors: malpositioning Relieving factors: repositioning     OBJECTIVE:   TODAY'S TREATMENT:  PWR! Moves seated x 10 reps each exercise, PWR! Up, rock and twist min-mod v.c, and demonstration for larger amplitude movements, and modifications due to pt discomfort.  Cane exercises closed chain for shoulder flexion and chest press, min v.c and demonstration to avoid compensation  Therapist started checking progress towards short term goals.   PATIENT EDUCATION: Education details: seated chest press and closed chain low range  Person educated: Patient Education method: Explanation, demonstration, verbal cues, handout  Education comprehension: verbalized understanding, returned demonstration     HOME EXERCISE PROGRAM: Production assistant, radio, PWR! Hands 12/16/21:  Seated PWR! Up, closed-chain shoulder flex    GOALS:     SHORT TERM GOALS: Target date: 12/29/2021   I with PD specific HEP   Goal status:  ongoing   2.  Pt will verbalize understanding of adapted strategies to maximize safety and I with ADLs/ IADLs .     Goal status:ongoing   3.  Pt will demonstrate improved fine motor coordination for ADLs as evidenced by decreasing bilateral 9 hole peg test score by 5 secs     Baseline: RUE 71 secs, LUE 90 secs Goal status: MET RUE 65, LUE 69 secs   4.  Pt will demonstrate improved ease with feeding as evidenced by decreasing PPT#2 (self feeding) by 3 secs Baseline: 16.37 secs Goal status: ongoing met 1/2 trials, 14.79, 12.50   5.  Pt will demonstrate improved bilateral UE functional use as evidenced by increasing bilateral box/ blocks score by 3 blocks. Baseline: RUE 31 blocks, 32 blocks Goal status:met for RUE, ongoing for left, RUE 37 blocks, 30 blocks   6. Pt will demonstrate understanding of memory compensations and ways to keep thinking skills sharp      Goal status: ongoing       LONG TERM GOALS: Target date: 02/23/2022   Pt will verbalize understanding of ways to prevent future PD related complications and PD community resources.   Goal status: INITIAL   2.  Pt will demonstrate ability to retrieve a lightweight object at 115 shoulder flexion and -35 elbow extension with RUE.   Baseline: 110, -40 Goal status: INITIAL   3.  Pt will demonstrate ability to retrieve a lightweight object at 90 shoulder flexion and -30 elbow extension with LUE   Baseline: 85, -30 Goal status: INITIAL   4.  Pt will demonstrate improved ease with dressing as evidenced by decreasing PPT#4, donn/ doff jacket in 16 sec or less. Baseline: 19.53 Goal status: INITIAL,   5. Pt will demonstrate ability to complete 3 button/ unbutton in 1 min 40 secs or less. Baseline:  I min 47 secs Goal status: INITIAL           ASSESSMENT:   CLINICAL IMPRESSION:  Pt is progressing towards goals.  She is performing HEP inconsistently due to pain.  PERFORMANCE DEFICITS in functional skills including ADLs, IADLs, coordination, dexterity, sensation, tone, ROM, strength, flexibility, FMC, GMC, mobility, balance, decreased knowledge of precautions, decreased knowledge of use of DME, vision, and UE functional use, cognitive skills including attention, memory, problem solving, safety awareness, thought, and understand, and psychosocial skills including coping strategies, environmental adaptation, and routines and behaviors.  IMPAIRMENTS are limiting patient from ADLs, IADLs, rest and sleep, play, leisure, and social participation.    COMORBIDITIES may have co-morbidities  that affects occupational performance. Patient will benefit from skilled OT to address above impairments and improve overall function.   MODIFICATION OR ASSISTANCE TO COMPLETE EVALUATION: Min-Moderate modification of tasks or assist with assess necessary to complete an evaluation.    OT OCCUPATIONAL PROFILE AND HISTORY: Detailed assessment: Review of records and additional review of physical, cognitive, psychosocial history related to current functional performance.   CLINICAL DECISION MAKING: Moderate - several treatment options, min-mod task modification necessary   REHAB POTENTIAL: Good   EVALUATION COMPLEXITY: Moderate       PLAN: OT FREQUENCY: 2x/week   OT DURATION: 12 weeks   PLANNED INTERVENTIONS: self care/ADL training, therapeutic exercise, therapeutic activity, neuromuscular re-education, manual therapy, passive range of motion, gait training, balance training, functional mobility training, splinting, ultrasound, paraffin, fluidotherapy, moist heat, cryotherapy, contrast bath, patient/family education, cognitive remediation/compensation, visual/perceptual remediation/compensation, energy conservation, coping strategies training, and  DME and/or AE instructions   RECOMMENDED OTHER SERVICES: n/a   CONSULTED AND AGREED WITH PLAN OF CARE: Patient   PLAN FOR NEXT SESSION: check remaining  goals, Review cane exercises, consider exercise flow sheet, memory compensations/ keeping thinking skills sharp, functional reaching, ADL strategies.    Theone Murdoch, OTR/L [phone] Phone: [phone] 10:22 AM 01/04/22  [phone] phone [phone] 01/04/22 10:22 AM

## 2022-01-04 NOTE — Therapy (Signed)
OUTPATIENT PHYSICAL THERAPY TREATMENT NOTE   Patient Name: Veronica Shaw MRN: [id number] DOB:[birth year], 71 y.o., female Today's Date: 01/04/2022  PCP: Lyndee Hensen, DO REFERRING PROVIDER: Ludwig Clarks, DO   END OF SESSION:   PT End of Session - 01/04/22 0935     Visit Number 10    Number of Visits 17    Date for PT Re-Evaluation 02/01/22    Authorization Type Medicare    Progress Note Due on Visit 10   performed on visit 9   PT Start Time 0933    PT Stop Time 1012    PT Time Calculation (min) 39 min    Equipment Utilized During Treatment --    Activity Tolerance Patient tolerated treatment well    Behavior During Therapy WFL for tasks assessed/performed                 Past Medical History:  Diagnosis Date   Acute on chronic systolic CHF (congestive heart failure), NYHA class 4 ([place]) 10/19/2020   Asthma    Back pain 09/18/2019   Cervical disc disorder with radiculopathy of cervical region 4/74/2595   Chronic systolic CHF (congestive heart failure) ([place])    a. cMRI 4/15: EF 34% and findings - c/w NICM, normal RV size and function (RVEF 61%), Mild MR // b. Echo 2/15:  EF 30-35%, diff HK, ant-sept AK, Gr 2 DD, mild MR, trivial TR  //  c. Echo 5/17: EF 20-25%, severe diffuse HK, marked systolic dyssynchrony, grade 1 diastolic dysfunction, mild MR  //  d. [place] 5/17: Fick CO 2.9, RVSP 19, PASP 15, PW mean 2, low filing pressures and preserved CO    Cognitive impairment 10/19/2017   MOCA was administered with a score of 23/30   Diabetes mellitus    Diabetic peripheral neuropathy ([place]) 12/09/2011   Dyspnea 09/19/2012   Flu 10/17/2017   Gastritis    History of echocardiogram    Echo 6/18: EF 30-35, diffuse HK, grade 1 diastolic dysfunction, trivial MR, mild LAE, mild TR, no pericardial effusion   History of nuclear stress test    Myov[place] 5/18: EF 49, no ischemia, inferoseptal defect c/w LBBB artifact (intermediate risk due to EF < 50).   HTN (hypertension)     Hyperlipidemia    Hyperlipidemia associated with type 2 diabetes mellitus ([place]) 03/05/2019   Hypertension associated with diabetes ([place]) 09/28/2006   Qualifier: Diagnosis of  By: [name]     Major depression 03/31/2013   Melena 08/21/2020   NICM (nonischemic cardiomyopathy) ([place])    a. Nuclear 5/13: Normal stress nuclear study. LV Ejection Fraction: 58%  //  b. LHC 10/14: Minor luminal irregularity in prox LAD, EF 35%    Parkinson disease (HCC)    Plantar fasciitis    Plantar fasciitis, bilateral 06/15/2012   Rosacea 05/29/2009   Qualifier: Diagnosis of  By: [name] MD, [name] hearing loss (SNHL), bilateral 01/04/2017   Sleep apnea    was retested and no longer had it and so d/c CPAP   Syncope    Thoracic or lumbosacral neuritis or radiculitis, unspecified 07/03/2013   Tinnitus, bilateral 01/04/2017   Type II diabetes mellitus with complication ([place]) 6/38/7564   Diabetic eye exam done by [facility]: Dr. Prudencio Burly on 11/08/13: no diabetic retinopathy. Repeat in 1 year     Urticaria    Past Surgical History:  Procedure Laterality Date   BREAST EXCISIONAL BIOPSY Left 1970   benign cyst  BREAST SURGERY Left    BUNIONECTOMY     CARDIAC CATHETERIZATION N/A 12/16/2015   Procedure: Right Heart Cath;  Surgeon: Sherren Mocha, MD;  Location: [facility];  Service: Cardiovascular;  Laterality: N/A;   CHOLECYSTECTOMY     eye lid surgery Bilateral 05/09/2019   IMPLANTABLE CARDIOVERTER DEFIBRILLATOR IMPLANT  11-25-13   MDT dual chamber ICD implanted by [name] for primary prevention   IMPLANTABLE CARDIOVERTER DEFIBRILLATOR IMPLANT N/A 11/25/2013   Procedure: IMPLANTABLE CARDIOVERTER DEFIBRILLATOR IMPLANT;  Surgeon: [name], MD;  Location[facility];  Service: Cardiovascular;  Laterality: N/A;   LEFT AND RIGHT HEART CATHETERIZATION WITH CORONARY ANGIOGRAM N/A 05/31/2013   Procedure: LEFT AND RIGHT HEART CATHETERIZATION WITH CORONARY ANGIOGRAM;   Surgeon: [name], MD;  Location: [facility] CATH LAB;  Service: Cardiovascular;  Laterality: N/A;   TONSILLECTOMY     TUBAL LIGATION     Patient Active Problem List   Diagnosis Date Noted   Type 2 diabetes mellitus with diabetic polyneuropathy, without long-term current use of insulin ([place]) 09/06/2021   Type 2 diabetes mellitus with stage 3a chronic kidney disease, with long-term current use of insulin ([place]) 09/06/2021   Dyslipidemia 09/06/2021   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) 08/19/2021   Orthostatic hypotension 07/27/2021   Mood changes 07/27/2021   Weight loss 05/07/2021   Acute on chronic systolic CHF (congestive heart failure), NYHA class 4 ([place]) 10/19/2020   Chronic HFrEF (heart failure with reduced ejection fraction) ([place])    Cervical dystonia 10/07/2020   Advanced care planning/counseling discussion 08/21/2020   Dementia without behavioral disturbance ([place]) 08/21/2020   Vision impairment, Right Eye 08/21/2020   Chronic kidney disease, stage 3a ([place]) 08/18/2020   Chronic rhinitis 12/18/2019   Chronic respiratory failure with hypoxia ([place]) 12/18/2019   Hyperlipidemia associated with type 2 diabetes mellitus ([place]) 03/05/2019   Cervical radiculopathy 10/24/2018   Falls frequently    Asthma 08/03/2017   Osteopenia 07/10/2015   Mild persistent asthma in adult without complication 97/94/8016   NICM (nonischemic cardiomyopathy) ([place]) 04/23/2014   Arthritis or polyarthritis, rheumatoid ([place]) 03/12/2014   ICD (implantable cardioverter-defibrillator), dual, in situ 03/06/2014   Heart Failure with Recovered Ejection Fraction, G2DD 05/31/2013   Parkinson disease ([place]) 05/21/2013   Vertigo 03/03/2013   Gastroesophageal reflux disease without esophagitis 09/27/2012   Trigger point with neck pain 04/12/2012   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   Diabetic peripheral neuropathy ([place]) 12/09/2011   Chronic urticaria 05/27/2011   Sinus tachycardia ([place]) 05/27/2011    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 05/20/2011   Type II diabetes mellitus with complication ([place]) 55/37/4827   Hypertension associated with diabetes ([place]) 09/28/2006    REFERRING DIAG: G20 (ICD-10-CM) - Parkinson's disease ([place])   THERAPY DIAG:  Other abnormalities of gait and mobility  Unsteadiness on feet  History of falling  PERTINENT HISTORY: PMH: PD, chronic systolic CHF, diabetes, HTN, HLD, mild persistent asthma, cervical dystonia, chronic kidney disease stage III, peripheral neuropathy.     PRECAUTIONS: Fall, no driving, defibrillator   SUBJECTIVE: Pt reports she was trying to get up from her recliner today and lost her balance to the L side and toppled onto other side of recliner- no injuries. Was using her RW instead of rollator. No other changes to report     PAIN:  Are you having pain? Yes NPRS scale: 6/10 Pain location: Neck, bilat knees, arthritic pain  PAIN TYPE: aching, sharp, and throbbing    TODAY'S TREATMENT:  NMR  In // bars for improved vestibular function for balance, ankle strategy and single leg stance:  -Standing on rockerboard without UE support and EO. Pt able to balance well, occasional over-corrections of posterior lean requiring min A to stabilize  -Progressed to EC on rocker board and noted significant retropulsion and over-correction of balance requiring min A to maintain midline orientation. Pt reported significant dizziness and required frequent rest breaks throughout  -On Airex, alt single cone taps without UE support, x10 per side. Noted posterolateral lean to L side and increased difficulty tapping w/LLE > RLE. Progressed to double cone taps for extended single leg stance, x10 per side. Min guard throughout due to posterolateral lean to L side, pt frequently bracing against bars to complete task.    Ther Ex  Scifit multi-peaks level 3 for 9 minutes using BUE/BLEs for dynamic cardiovascular conditioning, UE/LE coordination and global strengthening. Min cues  for increased amplitude of stepping throughout. RPE of 7/10 and dizziness rated 3/10 following activity   GAIT: Gait pattern: step through pattern, decreased stride length, and narrow BOS Distance walked:  around clinic with session Assistive device utilized: Walker - 4 wheeled Level of assistance:  supervision  Comments: Initial cues for foot clearance.    PATIENT EDUCATION: Education details: Continued to emphasize importance of performing HEP/walking program and using rollator at home  Person educated: Patient Education method: Explanation Education comprehension: verbalized/demonstrated understanding     HOME EXERCISE PROGRAM: [id number], Seated PWR moves, Walking program with rollator        GOALS: Goals reviewed with patient? Yes   SHORT TERM GOALS: Target date: 12/01/2021   Pt will be independent with initial HEP with family supervision in order to build upon functional gains made in therapy.   Baseline: Goal status: MET   2.  Pt will improve 5x sit<>stand to less than or equal to 33 sec without UE support to demonstrate improved functional strength and transfer efficiency.    Baseline:  39.65 without UE support; 25.41s without UE support  Goal status: MET   3.  Pt will improve gait speed with rollator to at least 1.3 ft/sec in order to demo improved household mobility/decr fall risk.    Baseline:  .98 ft/sec; 1.55 ft/s w/rollator  Goal status: MET   4.  Pt will perform condition 2 of mCTSIB for at least 30 sec in order to demo improved vestibular input for balance. Baseline: 19.6 sec; 30.34s w/S* Goal status: MET   5.  Pt will verbalize understanding of local Parkinson's disease resources, including options for community fitness. Baseline:  Goal status: PROGRESSING        LONG TERM GOALS: Target date: 12/29/2021   Pt will be independent with final HEP with family supervision in order to build upon functional gains made in therapy. Baseline:  Goal status:  ON-GOING    2.  Pt will recover posterior and anterior balance in push and release test in 3 or less steps independently, for improved balance recovery.  Baseline: anterior = 5-6 small steps, posterior = multiple and needs assist from therapist to maintain balance.  On 12/30/21, anterior = 1 step, posterior= 2-3 steps Goal status: MET    3.  Pt will perform condition 4 of mCTSIB for at least 10 sec in order to demo improved vestibular input for balance. Baseline: 2 sec; 9.7 seconds on 12/30/21 Goal status: PARTIALLY MET   4.  Pt will improve gait speed with rollator to at least 1.8 ft/sec in order to  demo improved household mobility/decr fall risk.  Baseline: .98 ft/sec w/ rollator  27.22 seconds = 1.20 ft/sec on 12/30/21 Goal status: NOT MET    5.  Pt will improve 5x sit<>stand to less than or equal to 20 sec without UE support to demonstrate improved functional strength and transfer efficiency.  Baseline: 39.65 sec without UE support  29.12 seconds on 12/30/21 Goal status: NOT MET   Updated/ongoing LTGs for 12 week POC  LONG TERM GOALS: Target date: 02/01/2022   Pt will be independent with final HEP with family supervision in order to build upon functional gains made in therapy. Baseline:  Goal status: PROGRESSING   2.  Pt will recover posterior balance in push and release test in 1-2 or less steps independently, for improved balance recovery.  Baseline: anterior = 5-6 small steps, posterior = multiple and needs assist from therapist to maintain balance.  On 12/30/21, anterior = 1 step, posterior= 2-3 steps Goal status: REVISED    3.  Pt will perform condition 4 of mCTSIB for at least 13 sec in order to demo improved vestibular input for balance. Baseline: 2 sec; 9.7 seconds on 12/30/21 Goal status: REVISED   4.  Pt will improve gait speed with rollator to at least 1.5 ft/sec in order to demo improved household mobility/decr fall risk.  Baseline: .98 ft/sec w/ rollator  27.22 seconds  = 1.20 ft/sec on 12/30/21 Goal status: REVISED   5.  Pt will improve 5x sit<>stand to less than or equal to 25 sec without UE support to demonstrate improved functional strength and transfer efficiency.  Baseline: 39.65 sec without UE support  29.12 seconds on 12/30/21 Goal status: REVISED   ASSESSMENT:   CLINICAL IMPRESSION: 10th visit PN performed last visit. Emphasis of skilled PT session on increased vestibular input for balance, single leg stability and global strength. Pt reported significant dizziness while standing on rocker board w/EC and frequently over-corrected posterior LOB, requiring min A to prevent anterior LOB. Pt seemingly unaware of over-corrections made throughout session. Pt continues to be limited by dizziness and neck pain but is progressing towards LTGS. Continue POC.     OBJECTIVE IMPAIRMENTS Abnormal gait, decreased activity tolerance, decreased balance, decreased coordination, decreased endurance, decreased knowledge of use of DME, difficulty walking, decreased strength, dizziness, impaired tone, postural dysfunction, and pain.    ACTIVITY LIMITATIONS cleaning, community activity, driving, meal prep, and laundry.    PERSONAL FACTORS Behavior pattern, Past/current experiences, Time since onset of injury/illness/exacerbation, and 3+ comorbidities: PD, chronic systolic CHF, diabetes, HTN, HLD, mild persistent asthma, cervical dystonia, chronic kidney disease stage III, peripheral neuropathy  are also affecting patient's functional outcome.      REHAB POTENTIAL: Good   CLINICAL DECISION MAKING: Evolving/moderate complexity   EVALUATION COMPLEXITY: Moderate   PLAN: PT FREQUENCY: 2x/week   PT DURATION: 12 weeks   PLANNED INTERVENTIONS: Therapeutic exercises, Therapeutic activity, Neuromuscular re-education, Balance training, Gait training, Patient/Family education, Stair training, Vestibular training, Canalith repositioning, and DME instructions   PLAN FOR NEXT  SESSION:  Perform vestibular assessment - just to see if anything else is going on? Review and update pt's HEP as needed. Balance with vision removed, stepping strategies for balance, SLS.  SciFit/Nustep for strengthening/endurance.  Sit <>stands on uneven surface for immediate standing balance. Continue to re-iterate using rollator/RW at all times for balance.   Cruzita Lederer Karmelo Bass, PT, DPT 01/04/22, 10:16 AM

## 2022-01-06 ENCOUNTER — Ambulatory Visit (INDEPENDENT_AMBULATORY_CARE_PROVIDER_SITE_OTHER): Payer: Medicare Other | Admitting: Internal Medicine

## 2022-01-06 ENCOUNTER — Encounter: Payer: Medicare Other | Admitting: Occupational Therapy

## 2022-01-06 ENCOUNTER — Encounter: Payer: Self-pay | Admitting: Internal Medicine

## 2022-01-06 VITALS — BP 110/68 | HR 72 | Ht 61.5 in | Wt 121.0 lb

## 2022-01-06 DIAGNOSIS — E1142 Type 2 diabetes mellitus with diabetic polyneuropathy: Secondary | ICD-10-CM | POA: Diagnosis not present

## 2022-01-06 DIAGNOSIS — R5383 Other fatigue: Secondary | ICD-10-CM

## 2022-01-06 DIAGNOSIS — E118 Type 2 diabetes mellitus with unspecified complications: Secondary | ICD-10-CM

## 2022-01-06 LAB — CBC
HCT: 39.5 % (ref 36.0–46.0)
Hemoglobin: 13.1 g/dL (ref 12.0–15.0)
MCHC: 33.3 g/dL (ref 30.0–36.0)
MCV: 90.3 fl (ref 78.0–100.0)
Platelets: 217 10*3/uL (ref 150.0–400.0)
RBC: 4.37 Mil/uL (ref 3.87–5.11)
RDW: 12.8 % (ref 11.5–15.5)
WBC: 7.1 10*3/uL (ref 4.0–10.5)

## 2022-01-06 LAB — BASIC METABOLIC PANEL
BUN: 24 mg/dL — ABNORMAL HIGH (ref 6–23)
CO2: 28 mEq/L (ref 19–32)
Calcium: 10.4 mg/dL (ref 8.4–10.5)
Chloride: 102 mEq/L (ref 96–112)
Creatinine, Ser: 1.25 mg/dL — ABNORMAL HIGH (ref 0.40–1.20)
GFR: 42.94 mL/min — ABNORMAL LOW (ref >60.00)
Glucose, Bld: 101 mg/dL — ABNORMAL HIGH (ref 70–99)
Potassium: 4.3 mEq/L (ref 3.5–5.1)
Sodium: 140 mEq/L (ref 135–145)

## 2022-01-06 LAB — TSH: TSH: 1.99 u[IU]/mL (ref 0.35–5.50)

## 2022-01-06 LAB — POCT GLUCOSE (DEVICE FOR HOME USE): Glucose Fasting, POC: 127 mg/dL — AB (ref 70–99)

## 2022-01-06 LAB — VITAMIN B12: Vitamin B-12: >1504 pg/mL — ABNORMAL HIGH (ref 211–911)

## 2022-01-06 LAB — T4, FREE: Free T4: 1.26 ng/dL (ref 0.60–1.60)

## 2022-01-06 MED ORDER — GLIPIZIDE 5 MG PO TABS
2.5000 mg | ORAL_TABLET | Freq: Every day | ORAL | 3 refills | Status: DC
Start: 2022-01-06 — End: 2022-07-07

## 2022-01-06 NOTE — Progress Notes (Signed)
Name: Veronica Shaw  MRN/ DOB: [id number], 03-16-1951   Age/ Sex: 71 y.o., female    PCP: Lyndee Hensen, DO   Reason for Endocrinology Evaluation: Type 2 Diabetes Mellitus     Date of Initial Endocrinology Visit: 09/06/2021    PATIENT IDENTIFIER: Veronica Shaw is a 71 y.o. female with a past medical history of T2Dm, NICM, Hx of CHF, Asthma, parkinson's disease. The patient presented for initial endocrinology clinic visit on 09/06/2021 for consultative assistance with her diabetes management.    HPI: Veronica Shaw was    Diagnosed with DM >15 yrs  Prior Medications tried/Intolerance: N/A Currently checking blood sugars occasionally   Hypoglycemia episodes : no            Hemoglobin A1c has ranged from 6.6% in 2020, peaking at 7.6% in 2022. Patient required assistance for hypoglycemia: no Patient has required hospitalization within the last 1 year from hyper or hypoglycemia: no  In terms of diet, the patient eats 3 meals a day, avoids snacking . She avoids sugar- sweetened beverages    Follows with Dr. Estanislado Pandy for OA  Follows with Dr. Vaughan Browner for Asthma  Follows with Dr. Carles Collet for Parkinson's disease   On her initial visit to our clinic her A1c was 7.4% . Started Glipizide, continued Metformin  Jardiance was cost prohibitive 09/2021  SUBJECTIVE:   During the last visit (09/06/2021): A1c 7.4% started Glipizide   Today (01/06/22): Veronica Shaw is here for a follow up on diabetes management.   She checks her blood sugars 2 times daily. She has not had any hypoglycemic episodes  She has been weak and not feeling well over the past few weeks and attributes this to Vitamin B12 deficiency  Denies fever , dysuria but no chest pain as noted SOB .Has minimal coughing , she is on oxygen  Has neuropathy in the hand and feet - sees neurology Q6 months   HOME DIABETES REGIMEN: Metformin 500 mg , 1 tablet Twice a day  Glipizide 5 mg , half a tablet before Breakfast      Statin: yes ACE-I/ARB: yes    METER DOWNLOAD SUMMARY: Did not bring    DIABETIC COMPLICATIONS: Microvascular complications:  CKD III, neuropathy  Denies: retinopathy  Last eye exam: Completed 2022  Macrovascular complications:   Denies: CAD, PVD, CVA   PAST HISTORY: Past Medical History:  Past Medical History:  Diagnosis Date   Acute on chronic systolic CHF (congestive heart failure), NYHA class 4 ([place]) 10/19/2020   Asthma    Back pain 09/18/2019   Cervical disc disorder with radiculopathy of cervical region 11/07/8117   Chronic systolic CHF (congestive heart failure) ([place])    a. cMRI 4/15: EF 34% and findings - c/w NICM, normal RV size and function (RVEF 61%), Mild MR // b. Echo 2/15:  EF 30-35%, diff HK, ant-sept AK, Gr 2 DD, mild MR, trivial TR  //  c. Echo 5/17: EF 20-25%, severe diffuse HK, marked systolic dyssynchrony, grade 1 diastolic dysfunction, mild MR  //  d. [place] 5/17: Fick CO 2.9, RVSP 19, PASP 15, PW mean 2, low filing pressures and preserved CO    Cognitive impairment 10/19/2017   MOCA was administered with a score of 23/30   Diabetes mellitus    Diabetic peripheral neuropathy ([place]) 12/09/2011   Dyspnea 09/19/2012   Flu 10/17/2017   Gastritis    History of echocardiogram    Echo 6/18: EF 30-35, diffuse HK, grade 1 diastolic dysfunction,  trivial MR, mild LAE, mild TR, no pericardial effusion   History of nuclear stress test    Myoview 5/18: EF 49, no ischemia, inferoseptal defect c/w LBBB artifact (intermediate risk due to EF < 50).   HTN (hypertension)    Hyperlipidemia    Hyperlipidemia associated with type 2 diabetes mellitus ([place]) 03/05/2019   Hypertension associated with diabetes ([place]) 09/28/2006   Qualifier: Diagnosis of  By: [name]     Major depression 03/31/2013   Melena 08/21/2020   NICM (nonischemic cardiomyopathy) ([place])    a. Nuclear 5/13: Normal stress nuclear study. LV Ejection Fraction: 58%  //  b. LHC 10/14: Minor luminal  irregularity in prox LAD, EF 35%    Parkinson disease (HCC)    Plantar fasciitis    Plantar fasciitis, bilateral 06/15/2012   Rosacea 05/29/2009   Qualifier: Diagnosis of  By: [name] MD, [name] hearing loss (SNHL), bilateral 01/04/2017   Sleep apnea    was retested and no longer had it and so d/c CPAP   Syncope    Thoracic or lumbosacral neuritis or radiculitis, unspecified 07/03/2013   Tinnitus, bilateral 01/04/2017   Type II diabetes mellitus with complication ([place]) 1/61/0960   Diabetic eye exam done by [facility]: Dr. Prudencio Burly on 11/08/13: no diabetic retinopathy. Repeat in 1 year     Urticaria    Past Surgical History:  Past Surgical History:  Procedure Laterality Date   BREAST EXCISIONAL BIOPSY Left 1970   benign cyst   BREAST SURGERY Left    BUNIONECTOMY     CARDIAC CATHETERIZATION N/A 12/16/2015   Procedure: Right Heart Cath;  Surgeon: Sherren Mocha, MD;  Location: [place] [facility];  Service: Cardiovascular;  Laterality: N/A;   CHOLECYSTECTOMY     eye lid surgery Bilateral 05/09/2019   IMPLANTABLE CARDIOVERTER DEFIBRILLATOR IMPLANT  11-25-13   MDT dual chamber ICD implanted by [name] for primary prevention   IMPLANTABLE CARDIOVERTER DEFIBRILLATOR IMPLANT N/A 11/25/2013   Procedure: IMPLANTABLE CARDIOVERTER DEFIBRILLATOR IMPLANT;  Surgeon: [name], MD;  Location: [facility] CATH LAB;  Service: Cardiovascular;  Laterality: N/A;   LEFT AND RIGHT HEART CATHETERIZATION WITH CORONARY ANGIOGRAM N/A 05/31/2013   Procedure: LEFT AND RIGHT HEART CATHETERIZATION WITH CORONARY ANGIOGRAM;  Surgeon: [name], MD;  Location: [facility] CATH LAB;  Service: Cardiovascular;  Laterality: N/A;   TONSILLECTOMY     TUBAL LIGATION      Social History:  reports that she has never smoked. She has been exposed to tobacco smoke. She has never used smokeless tobacco. She reports that she does not drink alcohol and does not use drugs. Family History:  Family History  Problem  Relation Age of Onset   Coronary artery disease Father        Died age 57   Heart attack Father    Diabetes Father    Coronary artery disease Mother        Died age 58   Heart attack Mother    Diabetes Mother    Parkinson's disease Sister    Heart disease Sister    Hepatitis C Sister    Diabetes Sister    Diabetes Son 76       T1DM   Healthy Daughter    Diabetes Sister    Heart disease Sister    Diabetes Sister    Heart disease Sister    Breast cancer Neg Hx      HOME MEDICATIONS: Allergies as of 01/06/2022  Reactions   Aspirin Swelling, Other (See Comments)   Other reaction(s): Other (See Comments) Causes nose bleeds *ONLY THE COATED ASA* Causes nose bleeds *ONLY THE COATED ASA*   Penicillins Shortness Of Breath, Rash   Shortness of Breath - Throat felt like it was closing.    Prempro [conj Estrog-medroxyprogest Ace] Shortness Of Breath   Throat swelling Throat swelling   Ace Inhibitors Cough   Simvastatin Other (See Comments), Rash   Muscle aches        Medication List        Accurate as of January 06, 2022  9:54 AM. If you have any questions, ask your nurse or doctor.          acetaminophen 500 MG tablet Commonly known as: TYLENOL Take 2 tablets (1,000 mg total) by mouth every 8 (eight) hours as needed.   APPLE CIDER VINEGAR PO Take 1 capsule by mouth daily.   Biotin 5000 MCG Caps Take 1 tablet by mouth daily.   budesonide-formoterol 160-4.5 MCG/ACT inhaler Commonly known as: Symbicort Inhale 2 puffs into the lungs 2 (two) times daily.   carbidopa-levodopa 25-100 MG tablet Commonly known as: SINEMET IR Take 1 tablet by mouth 3 (three) times daily.   carbidopa-levodopa 50-200 MG tablet Commonly known as: SINEMET CR Take 1 tablet by mouth at bedtime.   diclofenac Sodium 1 % Gel Commonly known as: VOLTAREN GIVE 4  TOPICALLY 4 TIMES DAILY   estradiol 0.1 MG/GM vaginal cream Commonly known as: ESTRACE every other day.   famotidine 20  MG tablet Commonly known as: PEPCID TAKE 1 TABLET TWICE DAILY   Flonase Sensimist 27.5 MCG/SPRAY nasal spray Generic drug: fluticasone Place 2 sprays into the nose daily.   furosemide 20 MG tablet Commonly known as: LASIX Take 20 mg by mouth daily.   gabapentin 100 MG capsule Commonly known as: NEURONTIN Take 100 mg by mouth daily.   glipiZIDE 5 MG tablet Commonly known as: GLUCOTROL Take 0.5 tablets (2.5 mg total) by mouth daily before breakfast.   levalbuterol 0.63 MG/3ML nebulizer solution Commonly known as: XOPENEX USE 1 VIAL IN NEBULIZER EVERY 4 TO 6 HOURS AS NEEDED FOR WHEEZING AND FOR SHORTNESS OF BREATH   losartan 25 MG tablet Commonly known as: COZAAR Take 0.5 tablets (12.5 mg total) by mouth daily.   melatonin 5 MG Tabs Take 5 mg by mouth at bedtime.   metFORMIN 500 MG tablet Commonly known as: GLUCOPHAGE Take 1 tablet (500 mg total) by mouth 2 (two) times daily with a meal.   metoprolol succinate 50 MG 24 hr tablet Commonly known as: TOPROL-XL Take 1 tablet (50 mg total) by mouth 2 (two) times daily.   mirtazapine 15 MG tablet Commonly known as: Remeron Take 1 tablet (15 mg total) by mouth at bedtime.   montelukast 10 MG tablet Commonly known as: SINGULAIR Take 1 tablet (10 mg total) by mouth at bedtime.   OXYGEN Inhale into the lungs at bedtime. Inhale 2L into lungs   potassium chloride 10 MEQ tablet Commonly known as: KLOR-CON Take 1 tablet (10 mEq total) by mouth daily.   rosuvastatin 10 MG tablet Commonly known as: CRESTOR Take 1 tablet (10 mg total) by mouth as directed. Three times a week   spironolactone 25 MG tablet Commonly known as: ALDACTONE TAKE 1/2 TABLET EVERY DAY   tiZANidine 2 MG tablet Commonly known as: ZANAFLEX TAKE 1 TABLET BY MOUTH EVERY 8 HOURS AS NEEDED FOR MUSCLE SPASM   ULTRA COQ10 PO Take  1 tablet by mouth 3 (three) times a week.         ALLERGIES: Allergies  Allergen Reactions   Aspirin Swelling and  Other (See Comments)    Other reaction(s): Other (See Comments) Causes nose bleeds *ONLY THE COATED ASA* Causes nose bleeds *ONLY THE COATED ASA*   Penicillins Shortness Of Breath and Rash    Shortness of Breath - Throat felt like it was closing.    Prempro [Conj Estrog-Medroxyprogest Ace] Shortness Of Breath    Throat swelling Throat swelling   Ace Inhibitors Cough   Simvastatin Other (See Comments) and Rash    Muscle aches     REVIEW OF SYSTEMS: A comprehensive ROS was conducted with the patient and is negative except as per HPI    OBJECTIVE:   VITAL SIGNS: BP 110/68 (BP Location: Left Arm, Patient Position: Sitting, Cuff Size: Small)   Pulse 72   Ht 5' 1.5" (1.562 m)   Wt 121 lb (54.9 kg)   LMP 08/01/2000 (Approximate)   SpO2 93%   BMI 22.49 kg/m    PHYSICAL EXAM:  General: Pt appears well and is in NAD  Neck: General: Supple without adenopathy or carotid bruits. Thyroid: Thyroid size normal.  No goiter or nodules appreciated.   Lungs: Clear with good BS bilat with no rales, rhonchi, or wheezes  Heart: RRR with normal S1 and S2 and no gallops; no murmurs; no rub  Extremities:  Lower extremities - No pretibial edema. No lesions.  Neuro: MS is good with appropriate affect, pt is alert and Ox3      DATA REVIEWED:  Lab Results  Component Value Date   HGBA1C 6.5 (H) 11/15/2021   HGBA1C 7.4 (A) 09/06/2021   HGBA1C 7.4 (A) 05/07/2021    Latest Reference Range & Units 01/06/22 10:04  Sodium 135 - 145 mEq/L 140  Potassium 3.5 - 5.1 mEq/L 4.3  Chloride 96 - 112 mEq/L 102  CO2 19 - 32 mEq/L 28  Glucose 70 - 99 mg/dL 101 (H)  BUN 6 - 23 mg/dL 24 (H)  Creatinine 0.40 - 1.20 mg/dL 1.25 (H)  Calcium 8.4 - 10.5 mg/dL 10.4  GFR >60.00 mL/min 42.94 (L)    Latest Reference Range & Units 01/06/22 10:04  Vitamin B12 211 - 911 pg/mL >1504 (H)    Latest Reference Range & Units 01/06/22 10:04  WBC 4.0 - 10.5 K/uL 7.1  RBC 3.87 - 5.11 Mil/uL 4.37  Hemoglobin 12.0 - 15.0  g/dL 13.1  HCT 36.0 - 46.0 % 39.5  MCV 78.0 - 100.0 fl 90.3  MCHC 30.0 - 36.0 g/dL 33.3  RDW 11.5 - 15.5 % 12.8  Platelets 150.0 - 400.0 K/uL 217.0     Latest Reference Range & Units 01/06/22 10:04  TSH 0.35 - 5.50 uIU/mL 1.99  T4,Free(Direct) 0.60 - 1.60 ng/dL 1.26    ASSESSMENT / PLAN / RECOMMENDATIONS:   1) Type 2 Diabetes Mellitus,Optimally controlled, With CKD III and neuropathic complications - Most recent A1c of 6.5%. Goal A1c < 7.0 %.     -Praised the patient improved glycemic control -There is no evidence of hypoglycemia, in office BG today 127 mg/DL -Tolerating glipizide without side effects, we will continue  MEDICATIONS: Continue metformin 500 mg twice daily Continue Glipizide 5 mg, half a tablet before breakfast   EDUCATION / INSTRUCTIONS: BG monitoring instructions: Patient is instructed to check her blood sugars 1 times a day, fasting. Call [facility] if: BG persistently < 70  I reviewed  the Rule of 15 for the treatment of hypoglycemia in detail with the patient. Literature supplied.   2) Diabetic complications:  Eye: Does not have known diabetic retinopathy.  Neuro/ Feet: Does  have known diabetic peripheral neuropathy. Renal: Patient does  have known baseline CKD. She is  on an ACEI/ARB at present.   3) Dyslipidemia:   -Due to LDL being above goal I have increased her rosuvastatin to 3 times weekly in February 2023. -She is intolerance to Simvastatin  -LDL trending down   Continue  Rosuvastatin 10 mg Three times a week    3.Fatigue :   - Pt requesting labs  - NO clinical evidence of infection  -CBC, TFTs are normal.  GFR stable.  Vitamin B12 not low -UA pending -Patient to follow-up with PCP if symptoms do not resolve   Follow-up in 6 months    Signed electronically by: Mack Guise, MD  [facility]  [address] [facility] [address] [place], [place] [address] Phone:  [phone] FAX: [phone]   CC: Lyndee Hensen, [place] [address] Phone: [phone]  Fax: [phone]    Return to Endocrinology clinic as below: Future Appointments  Date Time Provider [place]  01/10/2022  7:20 AM [facility] DEVICE REMOTES [facility]  01/11/2022  9:30 AM Arliss Journey, PT [facility]  01/11/2022 10:15 AM Vianne Bulls D, OT [facility]  01/13/2022  9:30 AM Arliss Journey, PT [facility]  01/13/2022 10:15 AM Delton Prairie, OT [facility]  01/18/2022  9:30 AM Arliss Journey, PT [facility]  01/18/2022 10:15 AM Rine, Selmer Dominion, OT [facility]  01/20/2022  9:30 AM Hulda Marin, OT [facility]  01/20/2022 10:15 AM Arliss Journey, PT [facility]  01/21/2022 10:00 AM [facility]M-CASE MANAGER [facility] [place]  01/25/2022  9:30 AM Arliss Journey, PT [facility]  01/25/2022 10:15 AM Delton Prairie, OT [facility]  01/27/2022  9:30 AM Arliss Journey, PT [facility]  01/27/2022 10:15 AM Rine, Selmer Dominion, OT [facility]  02/07/2022  9:20 AM Ofilia Neas, PA-C [facility] None  02/24/2022  8:10 AM [facility] DEVICE REMOTES [facility]  04/22/2022  9:15 AM Tat, Eustace Quail, DO [facility] None  05/26/2022  8:10 AM [facility] DEVICE REMOTES [facility]  06/28/2022  9:15 AM Richardson Dopp T, PA-C [facility]  08/25/2022  8:10 AM [facility] DEVICE REMOTES [facility]

## 2022-01-06 NOTE — Patient Instructions (Addendum)
-   Continue Metformin 500 mg , 1 tablet twice daily  - Continue Glipizide 5 mg, half a tablet before breakfast      HOW TO TREAT LOW BLOOD SUGARS (Blood sugar LESS THAN 70 MG/DL) Please follow the RULE OF 15 for the treatment of hypoglycemia treatment (when your (blood sugars are less than 70 mg/dL)   STEP 1: Take 15 grams of carbohydrates when your blood sugar is low, which includes:  3-4 GLUCOSE TABS  OR 3-4 OZ OF JUICE OR REGULAR SODA OR ONE TUBE OF GLUCOSE GEL    STEP 2: RECHECK blood sugar in 15 MINUTES STEP 3: If your blood sugar is still low at the 15 minute recheck --> then, go back to STEP 1 and treat AGAIN with another 15 grams of carbohydrates.

## 2022-01-07 ENCOUNTER — Ambulatory Visit: Payer: Medicare Other | Admitting: Physical Therapy

## 2022-01-08 LAB — URINALYSIS W MICROSCOPIC + REFLEX CULTURE
Bacteria, UA: NONE SEEN /HPF
Bilirubin Urine: NEGATIVE
Glucose, UA: NEGATIVE
Hgb urine dipstick: NEGATIVE
Hyaline Cast: NONE SEEN /LPF
Ketones, ur: NEGATIVE
Nitrites, Initial: NEGATIVE
Protein, ur: NEGATIVE
RBC / HPF: NONE SEEN /HPF (ref 0–2)
Specific Gravity, Urine: 1.013 (ref 1.001–1.035)
pH: 5.5 (ref 5.0–8.0)

## 2022-01-08 LAB — URINE CULTURE
MICRO NUMBER:: 13503362
SPECIMEN QUALITY:: ADEQUATE

## 2022-01-08 LAB — CULTURE INDICATED

## 2022-01-10 ENCOUNTER — Ambulatory Visit: Payer: Medicare Other

## 2022-01-10 DIAGNOSIS — I5022 Chronic systolic (congestive) heart failure: Secondary | ICD-10-CM

## 2022-01-10 NOTE — Therapy (Signed)
OUTPATIENT OCCUPATIONAL THERAPY TREATMENT NOTE   Patient Name: Veronica Shaw MRN: [id number] DOB:1951/07/26, 71 y.o., female Today's Date: 01/11/2022  PCP: Dr. Susa Simmonds REFERRING PROVIDER: Dr. Carles Collet   END OF SESSION:   OT End of Session - 01/11/22 1021     Visit Number 7    Number of Visits 25    Date for OT Re-Evaluation 02/23/22    Authorization Type Medicare    Authorization Time Period POC written for 12 weeks, may d/c after 8 weeks    Authorization - Visit Number 7    Authorization - Number of Visits 10    Progress Note Due on Visit 10    OT Start Time 1020    OT Stop Time 1100    OT Time Calculation (min) 40 min    Activity Tolerance Patient tolerated treatment well    Behavior During Therapy WFL for tasks assessed/performed                        Past Medical History:  Diagnosis Date   Acute on chronic systolic CHF (congestive heart failure), NYHA class 4 ([place]) 10/19/2020   Asthma    Back pain 09/18/2019   Cervical disc disorder with radiculopathy of cervical region 0/88/1103   Chronic systolic CHF (congestive heart failure) ([place])    a. cMRI 4/15: EF 34% and findings - c/w NICM, normal RV size and function (RVEF 61%), Mild MR // b. Echo 2/15:  EF 30-35%, diff HK, ant-sept AK, Gr 2 DD, mild MR, trivial TR  //  c. Echo 5/17: EF 20-25%, severe diffuse HK, marked systolic dyssynchrony, grade 1 diastolic dysfunction, mild MR  //  d. Elias-Fela Solis 5/17: Fick CO 2.9, RVSP 19, PASP 15, PW mean 2, low filing pressures and preserved CO    Cognitive impairment 10/19/2017   MOCA was administered with a score of 23/30   Diabetes mellitus    Diabetic peripheral neuropathy ([place]) 12/09/2011   Dyspnea 09/19/2012   Flu 10/17/2017   Gastritis    History of echocardiogram    Echo 6/18: EF 30-35, diffuse HK, grade 1 diastolic dysfunction, trivial MR, mild LAE, mild TR, no pericardial effusion   History of nuclear stress test    Myoview 5/18: EF 49, no ischemia, inferoseptal defect  c/w LBBB artifact (intermediate risk due to EF < 50).   HTN (hypertension)    Hyperlipidemia    Hyperlipidemia associated with type 2 diabetes mellitus ([place]) 03/05/2019   Hypertension associated with diabetes ([place]) 09/28/2006   Qualifier: Diagnosis of  By: [name]     Major depression 03/31/2013   Melena 08/21/2020   NICM (nonischemic cardiomyopathy) ([place])    a. Nuclear 5/13: Normal stress nuclear study. LV Ejection Fraction: 58%  //  b. LHC 10/14: Minor luminal irregularity in prox LAD, EF 35%    Parkinson disease (HCC)    Plantar fasciitis    Plantar fasciitis, bilateral 06/15/2012   Rosacea 05/29/2009   Qualifier: Diagnosis of  By: [name] MD, [name] hearing loss (SNHL), bilateral 01/04/2017   Sleep apnea    was retested and no longer had it and so d/c CPAP   Syncope    Thoracic or lumbosacral neuritis or radiculitis, unspecified 07/03/2013   Tinnitus, bilateral 01/04/2017   Type II diabetes mellitus with complication ([place]) 1/59/4585   Diabetic eye exam done by [facility]: Dr. Prudencio Burly on 11/08/13: no diabetic retinopathy. Repeat in 1 year  Urticaria    Past Surgical History:  Procedure Laterality Date   BREAST EXCISIONAL BIOPSY Left 1970   benign cyst   BREAST SURGERY Left    BUNIONECTOMY     CARDIAC CATHETERIZATION N/A 12/16/2015   Procedure: Right Heart Cath;  Surgeon: Sherren Mocha, MD;  Location: [place] [facility];  Service: Cardiovascular;  Laterality: N/A;   CHOLECYSTECTOMY     eye lid surgery Bilateral 05/09/2019   IMPLANTABLE CARDIOVERTER DEFIBRILLATOR IMPLANT  11-25-13   MDT dual chamber ICD implanted by [name] for primary prevention   IMPLANTABLE CARDIOVERTER DEFIBRILLATOR IMPLANT N/A 11/25/2013   Procedure: IMPLANTABLE CARDIOVERTER DEFIBRILLATOR IMPLANT;  Surgeon: [name], MD;  Location: [facility] CATH LAB;  Service: Cardiovascular;  Laterality: N/A;   LEFT AND RIGHT HEART CATHETERIZATION WITH CORONARY ANGIOGRAM N/A 05/31/2013    Procedure: LEFT AND RIGHT HEART CATHETERIZATION WITH CORONARY ANGIOGRAM;  Surgeon: [name], MD;  Location: [facility] CATH LAB;  Service: Cardiovascular;  Laterality: N/A;   TONSILLECTOMY     TUBAL LIGATION     Patient Active Problem List   Diagnosis Date Noted   Type 2 diabetes mellitus with diabetic polyneuropathy, without long-term current use of insulin ([place]) 09/06/2021   Type 2 diabetes mellitus with stage 3a chronic kidney disease, with long-term current use of insulin ([place]) 09/06/2021   Dyslipidemia 09/06/2021   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) 08/19/2021   Orthostatic hypotension 07/27/2021   Mood changes 07/27/2021   Weight loss 05/07/2021   Acute on chronic systolic CHF (congestive heart failure), NYHA class 4 ([place]) 10/19/2020   Chronic HFrEF (heart failure with reduced ejection fraction) ([place])    Cervical dystonia 10/07/2020   Advanced care planning/counseling discussion 08/21/2020   Dementia without behavioral disturbance ([place]) 08/21/2020   Vision impairment, Right Eye 08/21/2020   Chronic kidney disease, stage 3a ([place]) 08/18/2020   Chronic rhinitis 12/18/2019   Chronic respiratory failure with hypoxia ([place]) 12/18/2019   Hyperlipidemia associated with type 2 diabetes mellitus ([place]) 03/05/2019   Cervical radiculopathy 10/24/2018   Falls frequently    Asthma 08/03/2017   Osteopenia 07/10/2015   Mild persistent asthma in adult without complication 16/08/930   NICM (nonischemic cardiomyopathy) ([place]) 04/23/2014   Arthritis or polyarthritis, rheumatoid ([place]) 03/12/2014   ICD (implantable cardioverter-defibrillator), dual, in situ 03/06/2014   Heart Failure with Recovered Ejection Fraction, G2DD 05/31/2013   Parkinson disease ([place]) 05/21/2013   Vertigo 03/03/2013   Gastroesophageal reflux disease without esophagitis 09/27/2012   Trigger point with neck pain 04/12/2012   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   Diabetic peripheral neuropathy ([place]) 12/09/2011    Chronic urticaria 05/27/2011   Sinus tachycardia ([place]) 05/27/2011   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  Pt received PT in 2021 but no OT.   PMH is significant for:  PD, chronic systolic CHF, diabetes, HTN, HLD, mild persistent asthma, cervical dystonia, chronic kidney disease stage III, peripheral neuropathy, pacemaker / ICD  PRECAUTIONS: impaired sensation both hands due to neuropathy, pacemaker/ICD, fall risk   SUBJECTIVE: I fall because I get lightheaded   PAIN:  Are you having pain? Yes: NPRS scale: 4/10 Pain location: shoulders, neck, hands, knees Pain description: aching Aggravating factors: turning neck, walking (knees) Relieving factors: unknown     OBJECTIVE:   TODAY'S TREATMENT:   Reviewed seated chest press and closed chain low range (returned demo with min v.c for compensation).     PATIENT EDUCATION: Education details: TEFL teacher, Chartered certified accountant.  Also began discussing appropriate community resources related to PD--pt reports transportation is a barrier.  Person educated: Patient Education method: Explanation, handout Education comprehension: verbalized understanding     HOME EXERCISE PROGRAM: Basic Coordination, PWR! Hands 12/16/21:  Seated PWR! Up, closed-chain shoulder flex  01/11/22:  Memory Compensation Strategies, Keeping Thinking Skills  Sharp   GOALS:     SHORT TERM GOALS: Target date: 12/29/2021   I with PD specific HEP  Goal status:  ongoing, would benefit from review   2.  Pt will verbalize understanding of adapted strategies to maximize safety and I with ADLs/ IADLs.   Goal status:ongoing, needs review   3.  Pt will demonstrate improved fine motor coordination for ADLs as evidenced by decreasing bilateral 9 hole peg test score by 5 secs   Baseline: RUE 71 secs, LUE 90 secs Goal status: MET RUE 65, LUE 69 secs   4.  Pt will demonstrate improved ease with feeding as evidenced by decreasing PPT#2 (self feeding) by 3 secs Baseline: 16.37 secs Goal status: ongoing met 1/2 trials, 14.79, 12.50   5.  Pt will demonstrate improved bilateral UE functional use as evidenced by increasing bilateral box/ blocks score by 3 blocks. Baseline: RUE 31 blocks, 32 blocks Goal status:met for RUE, ongoing for left, RUE 37 blocks, 30 blocks   6. Pt will demonstrate understanding of memory compensations and ways to keep thinking skills sharp      Goal status: ongoing.  Issued and reviewed 01/11/22       LONG TERM GOALS: Target date: 02/23/2022   Pt will verbalize understanding of ways to prevent future PD related complications and PD community resources.   Goal status: INITIAL   2.  Pt will demonstrate ability to retrieve a lightweight object at 115 shoulder flexion and -35 elbow extension with RUE.   Baseline: 110, -40 Goal status: INITIAL   3.  Pt will demonstrate ability to retrieve a lightweight object at 90 shoulder flexion and -30 elbow extension with LUE   Baseline: 85, -30 Goal status: INITIAL   4.  Pt will demonstrate improved ease with dressing as evidenced by decreasing PPT#4, donn/ doff jacket in 16 sec or less. Baseline: 19.53 Goal status: INITIAL,   5. Pt will demonstrate ability to complete 3 button/ unbutton in 1 min 40 secs or less. Baseline:  I min 47 secs Goal status: INITIAL           ASSESSMENT:   CLINICAL IMPRESSION:  Pt verbalized understanding of memory/cognitive compensation strategies, but would benefit from exercise chart.  PERFORMANCE DEFICITS in functional skills including ADLs, IADLs, coordination, dexterity, sensation, tone, ROM, strength, flexibility, FMC, GMC, mobility, balance, decreased knowledge of precautions, decreased knowledge of use of DME, vision, and UE functional use, cognitive skills including attention, memory, problem solving, safety awareness, thought, and understand, and psychosocial skills including coping strategies, environmental adaptation, and routines and behaviors.    IMPAIRMENTS are limiting patient from ADLs,  IADLs, rest and sleep, play, leisure, and social participation.    COMORBIDITIES may have co-morbidities  that affects occupational performance. Patient will benefit from skilled OT to address above impairments and improve overall function.   MODIFICATION OR ASSISTANCE TO COMPLETE EVALUATION: Min-Moderate modification of tasks or assist with assess necessary to complete an evaluation.    OT OCCUPATIONAL PROFILE AND HISTORY: Detailed assessment: Review of records and additional review of physical, cognitive, psychosocial history related to current functional performance.   CLINICAL DECISION MAKING: Moderate - several treatment options, min-mod task modification necessary   REHAB POTENTIAL: Good   EVALUATION COMPLEXITY: Moderate       PLAN: OT FREQUENCY: 2x/week   OT DURATION: 12 weeks   PLANNED INTERVENTIONS: self care/ADL training, therapeutic exercise, therapeutic activity, neuromuscular re-education, manual therapy, passive range of motion, gait training, balance training, functional mobility training, splinting, ultrasound, paraffin, fluidotherapy, moist heat, cryotherapy, contrast bath, patient/family education, cognitive remediation/compensation, visual/perceptual remediation/compensation, energy conservation, coping  strategies training, and DME and/or AE instructions   RECOMMENDED OTHER SERVICES: n/a   CONSULTED AND AGREED WITH PLAN OF CARE: Patient   PLAN FOR NEXT SESSION:  consider exercise flow sheet, functional reaching, ADL strategies, issue/review community resources handout and ways to decr risk of precautions     Vianne Bulls, OTR/L [facility] [address] [place], [place]  [address] [phone] phone [phone] 01/11/22 2:51 PM

## 2022-01-11 ENCOUNTER — Ambulatory Visit: Payer: Medicare Other | Admitting: Occupational Therapy

## 2022-01-11 ENCOUNTER — Encounter: Payer: Self-pay | Admitting: Occupational Therapy

## 2022-01-11 ENCOUNTER — Ambulatory Visit: Payer: Medicare Other | Admitting: Physical Therapy

## 2022-01-11 ENCOUNTER — Encounter: Payer: Self-pay | Admitting: Physical Therapy

## 2022-01-11 DIAGNOSIS — R208 Other disturbances of skin sensation: Secondary | ICD-10-CM | POA: Diagnosis not present

## 2022-01-11 DIAGNOSIS — M25611 Stiffness of right shoulder, not elsewhere classified: Secondary | ICD-10-CM

## 2022-01-11 DIAGNOSIS — R29818 Other symptoms and signs involving the nervous system: Secondary | ICD-10-CM | POA: Diagnosis not present

## 2022-01-11 DIAGNOSIS — M25612 Stiffness of left shoulder, not elsewhere classified: Secondary | ICD-10-CM

## 2022-01-11 DIAGNOSIS — M25621 Stiffness of right elbow, not elsewhere classified: Secondary | ICD-10-CM

## 2022-01-11 DIAGNOSIS — R2681 Unsteadiness on feet: Secondary | ICD-10-CM | POA: Diagnosis not present

## 2022-01-11 DIAGNOSIS — R2689 Other abnormalities of gait and mobility: Secondary | ICD-10-CM | POA: Diagnosis not present

## 2022-01-11 DIAGNOSIS — R278 Other lack of coordination: Secondary | ICD-10-CM

## 2022-01-11 DIAGNOSIS — M25622 Stiffness of left elbow, not elsewhere classified: Secondary | ICD-10-CM

## 2022-01-11 DIAGNOSIS — Z9181 History of falling: Secondary | ICD-10-CM | POA: Diagnosis not present

## 2022-01-11 NOTE — Therapy (Signed)
OUTPATIENT PHYSICAL THERAPY TREATMENT NOTE   Patient Name: Veronica Shaw MRN: [id number] DOB:1951/03/31, 71 y.o., female Today's Date: 01/11/2022  PCP: Lyndee Hensen, DO REFERRING PROVIDER: Ludwig Clarks, DO   END OF SESSION:   PT End of Session - 01/11/22 0929     Visit Number 11    Number of Visits 17    Date for PT Re-Evaluation 02/01/22    Authorization Type Medicare    Progress Note Due on Visit 10   performed on visit 9   PT Start Time 0927    PT Stop Time 1013    PT Time Calculation (min) 46 min    Activity Tolerance Patient tolerated treatment well    Behavior During Therapy [facility] for tasks assessed/performed                 Past Medical History:  Diagnosis Date   Acute on chronic systolic CHF (congestive heart failure), NYHA class 4 ([place]) 10/19/2020   Asthma    Back pain 09/18/2019   Cervical disc disorder with radiculopathy of cervical region 6/76/1950   Chronic systolic CHF (congestive heart failure) ([place])    a. cMRI 4/15: EF 34% and findings - c/w NICM, normal RV size and function (RVEF 61%), Mild MR // b. Echo 2/15:  EF 30-35%, diff HK, ant-sept AK, Gr 2 DD, mild MR, trivial TR  //  c. Echo 5/17: EF 20-25%, severe diffuse HK, marked systolic dyssynchrony, grade 1 diastolic dysfunction, mild MR  //  d. [place] 5/17: Fick CO 2.9, RVSP 19, PASP 15, PW mean 2, low filing pressures and preserved CO    Cognitive impairment 10/19/2017   MOCA was administered with a score of 23/30   Diabetes mellitus    Diabetic peripheral neuropathy ([place]) 12/09/2011   Dyspnea 09/19/2012   Flu 10/17/2017   Gastritis    History of echocardiogram    Echo 6/18: EF 30-35, diffuse HK, grade 1 diastolic dysfunction, trivial MR, mild LAE, mild TR, no pericardial effusion   History of nuclear stress test    Myoview 5/18: EF 49, no ischemia, inferoseptal defect c/w LBBB artifact (intermediate risk due to EF < 50).   HTN (hypertension)    Hyperlipidemia    Hyperlipidemia associated with  type 2 diabetes mellitus ([place]) 03/05/2019   Hypertension associated with diabetes ([place]) 09/28/2006   Qualifier: Diagnosis of  By: [name]     Major depression 03/31/2013   Melena 08/21/2020   NICM (nonischemic cardiomyopathy) ([place])    a. Nuclear 5/13: Normal stress nuclear study. LV Ejection Fraction: 58%  //  b. LHC 10/14: Minor luminal irregularity in prox LAD, EF 35%    Parkinson disease (HCC)    Plantar fasciitis    Plantar fasciitis, bilateral 06/15/2012   Rosacea 05/29/2009   Qualifier: Diagnosis of  By: [name] MD, [name] hearing loss (SNHL), bilateral 01/04/2017   Sleep apnea    was retested and no longer had it and so d/c CPAP   Syncope    Thoracic or lumbosacral neuritis or radiculitis, unspecified 07/03/2013   Tinnitus, bilateral 01/04/2017   Type II diabetes mellitus with complication ([place]) 9/32/6712   Diabetic eye exam done by [facility]: Dr. Prudencio Burly on 11/08/13: no diabetic retinopathy. Repeat in 1 year     Urticaria    Past Surgical History:  Procedure Laterality Date   BREAST EXCISIONAL BIOPSY Left 1970   benign cyst   BREAST SURGERY Left    BUNIONECTOMY  CARDIAC CATHETERIZATION N/A 12/16/2015   Procedure: Right Heart Cath;  Surgeon: Sherren Mocha, MD;  Location: [place] [facility];  Service: Cardiovascular;  Laterality: N/A;   CHOLECYSTECTOMY     eye lid surgery Bilateral 05/09/2019   IMPLANTABLE CARDIOVERTER DEFIBRILLATOR IMPLANT  11-25-13   MDT dual chamber ICD implanted by [name] for primary prevention   IMPLANTABLE CARDIOVERTER DEFIBRILLATOR IMPLANT N/A 11/25/2013   Procedure: IMPLANTABLE CARDIOVERTER DEFIBRILLATOR IMPLANT;  Surgeon: [name], MD;  Location: [facility] C[facility] LAB;  Service: Cardiovascular;  Laterality: N/A;   LEFT AND RIGHT HEART CATHETERIZATION WITH CORONARY ANGIOGRAM N/A 05/31/2013   Procedure: LEFT AND RIGHT HEART CATHETERIZATION WITH CORONARY ANGIOGRAM;  Surgeon: [name], MD;  Location: [place] CATH LAB;   Service: Cardiovascular;  Laterality: N/A;   TONSILLECTOMY     TUBAL LIGATION     Patient Active Problem List   Diagnosis Date Noted   Type 2 diabetes mellitus with diabetic polyneuropathy, without long-term current use of insulin ([place]) 09/06/2021   Type 2 diabetes mellitus with stage 3a chronic kidney disease, with long-term current use of insulin ([place]) 09/06/2021   Dyslipidemia 09/06/2021   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) 08/19/2021   Orthostatic hypotension 07/27/2021   Mood changes 07/27/2021   Weight loss 05/07/2021   Acute on chronic systolic CHF (congestive heart failure), NYHA class 4 ([place]) 10/19/2020   Chronic HFrEF (heart failure with reduced ejection fraction) ([place])    Cervical dystonia 10/07/2020   Advanced care planning/counseling discussion 08/21/2020   Dementia without behavioral disturbance ([place]) 08/21/2020   Vision impairment, Right Eye 08/21/2020   Chronic kidney disease, stage 3a ([place]) 08/18/2020   Chronic rhinitis 12/18/2019   Chronic respiratory failure with hypoxia ([place]) 12/18/2019   Hyperlipidemia associated with type 2 diabetes mellitus ([place]) 03/05/2019   Cervical radiculopathy 10/24/2018   Falls frequently    Asthma 08/03/2017   Osteopenia 07/10/2015   Mild persistent asthma in adult without complication 51/88/4166   NICM (nonischemic cardiomyopathy) ([place]) 04/23/2014   Arthritis or polyarthritis, rheumatoid ([place]) 03/12/2014   ICD (implantable cardioverter-defibrillator), dual, in situ 03/06/2014   Heart Failure with Recovered Ejection Fraction, G2DD 05/31/2013   Parkinson disease ([place]) 05/21/2013   Vertigo 03/03/2013   Gastroesophageal reflux disease without esophagitis 09/27/2012   Trigger point with neck pain 04/12/2012   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   Diabetic peripheral neuropathy ([place]) 12/09/2011   Chronic urticaria 05/27/2011   Sinus tachycardia ([place]) 05/27/2011   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     PRECAUTIONS: Fall, no driving, defibrillator   SUBJECTIVE: Wants to end therapy at the end of June. Had a fall recently - was walking and felt dizzy. Feels like the dizziness is getting a little worse.     PAIN:  Are you having pain? Yes NPRS scale: 4-5/10 Pain location: Neck, bilat knees, arthritic pain  PAIN TYPE: aching, sharp, and throbbing    TODAY'S TREATMENT:   VESTIBULAR ASSESSMENT     SYMPTOM BEHAVIOR:   Subjective history: Has had episodes in the past, but not  as frequent as it is now. Pt with very limited neck ROM.   Non-Vestibular symptoms: neck pain   Type of dizziness: Imbalance (Disequilibrium), Spinning/Vertigo, and Lightheadedness/Faint   Frequency: Usually when she gets up, everyday.   Duration: <1 minute   Aggravating factors: Induced by position change: sit to stand and Induced by motion: turning body quickly, turning head quickly, and getting up too quickly, turning in general.      Relieving factors:  sitting down.   Progression of symptoms: worse   OCULOMOTOR EXAM:   Ocular Alignment: normal   Ocular ROM: No Limitations   Spontaneous Nystagmus: absent   Gaze-Induced Nystagmus: absent   Smooth Pursuits: intact - more jumpy movements going to R>L and pt trying to move head vs. Eyes, a little dizziness.    Saccades: hypometric/undershoots, 2 saccadic beats in each direction.       VESTIBULAR - OCULAR REFLEX:    Slow VOR: Normal Mild dizziness    VOR Cancellation:  Normal   Head-Impulse Test: HIT Right: negative HIT Left: negative Mild dizziness afterwards.    Dynamic Visual Acuity:  Did not test    POSITIONAL TESTING: Right Roll Test: no nystagmus Left Roll Test: no nystagmus - mild dizziness  Right Sidelying: no nystagmus Left Sidelying: no nystagmus  Unable to test Marye Round due to decr neck AROM.     MOTION SENSITIVITY:    Motion Sensitivity Quotient  Intensity: 0 = none, 1 = Lightheaded, 2 = Mild, 3 = Moderate, 4 = Severe, 5 = Vomiting  Intensity  1. Sitting to supine   2. Supine to L side 2  3. Supine to R side 0  4. Supine to sitting   5. L Hallpike-Dix   6. Up from L    7. R Hallpike-Dix   8. Up from R    9. Sitting, head  tipped to L knee 2  10. Head up from L  knee 2  11. Sitting, head  tipped to R knee 2  12. Head up from R  knee 2  13. Sitting head turns x5 2  14.Sitting head nods x5 1  15. In stance, 180  turn to L  1  16. In stance, 180  turn to R 1     OTHOSTATICS: Supine: 106/51 with HR: 68 Sitting: 92/54 with HR: 74 Standing: 105/56 with HR 77  NMR:   Access Code: [email or web address] URL: [email or web address] Date: 01/11/2022 Prepared by: [name]  Exercises - Sit to Stand with Hands on Knees  - 2 x daily - 5 x weekly - 1 sets - 10 reps - Step Sideways with Arms Reaching  - 1 x daily - 5 x weekly - 1 sets - 10 reps  New addition on 01/11/22: - Seated Horizontal Smooth Pursuit  - 2 x daily - 5 x weekly - 3 sets - 5 reps - cued to keep head still and just move eyes only. Pt more challenged going to the R, mild dizziness.   GAIT: Gait pattern: step through pattern, decreased stride length, and narrow BOS Distance walked:  around clinic with session Assistive device utilized: Walker - 4 wheeled Level of assistance:  supervision  Comments: Initial cues for foot clearance.      PATIENT EDUCATION: Education details: Results of vestibular assessment. PT to follow up with pt's  cardiologist regarding orthostatics and lower BP due to pt having more falls recently (pt reports that they are aware and if her BP is low then  she does not take a certain pill in the morning). Importance of pt stayed hydrated and drinking water (pt not drinking enough), taking time with transitions, and performing seated marching and ankle pumps in sitting before standing. Discussed areas to continue to work on in therapy and added seated smooth pursuits to HEP to address.  Person educated: Patient Education method: Explanation and Handout Education comprehension: verbalized/demonstrated understanding     HOME EXERCISE PROGRAM: [email or web address], Seated PWR moves, Walking program with rollator        GOALS: Goals reviewed with patient? Yes   SHORT TERM GOALS: Target date: 12/01/2021   Pt will be independent with initial HEP with family supervision in order to build upon functional gains made in therapy.   Baseline: Goal status: MET   2.  Pt will improve 5x sit<>stand to less than or equal to 33 sec without UE support to demonstrate improved functional strength and transfer efficiency.    Baseline:  39.65 without UE support; 25.41s without UE support  Goal status: MET   3.  Pt will improve gait speed with rollator to at least 1.3 ft/sec in order to demo improved household mobility/decr fall risk.    Baseline:  .98 ft/sec; 1.55 ft/s w/rollator  Goal status: MET   4.  Pt will perform condition 2 of mCTSIB for at least 30 sec in order to demo improved vestibular input for balance. Baseline: 19.6 sec; 30.34s w/S* Goal status: MET   5.  Pt will verbalize understanding of local Parkinson's disease resources, including options for community fitness. Baseline:  Goal status: PROGRESSING       Updated/ongoing LTGs for 12 week POC  LONG TERM GOALS: Target date: 02/01/2022   Pt will be independent with final HEP with family supervision in order to build upon functional gains made in  therapy. Baseline:  Goal status: PROGRESSING   2.  Pt will recover posterior balance in push and release test in 1-2 or less steps independently, for improved balance recovery.  Baseline: anterior = 5-6 small steps, posterior = multiple and needs assist from therapist to maintain balance.  On 12/30/21, anterior = 1 step, posterior= 2-3 steps Goal status: REVISED    3.  Pt will perform condition 4 of mCTSIB for at least 13 sec in order to demo improved vestibular input for balance. Baseline: 2 sec; 9.7 seconds on 12/30/21 Goal status: REVISED   4.  Pt will improve gait speed with rollator to at least 1.5 ft/sec in order to demo improved household mobility/decr fall risk.  Baseline: .98 ft/sec w/ rollator  27.22 seconds = 1.20 ft/sec on 12/30/21 Goal status: REVISED   5.  Pt will improve 5x sit<>stand to less than or equal to 25 sec without UE support to demonstrate improved functional strength and transfer efficiency.  Baseline: 39.65 sec without UE support  29.12 seconds on 12/30/21 Goal status: REVISED   ASSESSMENT:   CLINICAL IMPRESSION: Performed further vestibular assessment today due to pt reporting more episodes of dizziness and has had a couple more recent falls due to dizziness/lightheadedness. With vestibular assessment, pt with abnormal oculomotor exam, motion sensitivity, and dizziness with VOR testing (however it was negative). Pt with lower BP values for supine > sit and pt reporting incr dizziness, however pt's BP incr from sitting > standing. Pt also with abnormal posture and decr cervical ROM and pt does not really move her head which can also be contributing to dizziness. PT will also address these vestibular impairments and plan  to send a message to pt's cardiologist to see if there is anything else that can be done for pt's lower BP that could be contributing to falls or imbalance/dizziness. Educated on ways to help with lower BP. Will continue to progress towards LTGs.       OBJECTIVE IMPAIRMENTS Abnormal gait, decreased activity tolerance, decreased balance, decreased coordination, decreased endurance, decreased knowledge of use of DME, difficulty walking, decreased strength, dizziness, impaired tone, postural dysfunction, and pain.    ACTIVITY LIMITATIONS cleaning, community activity, driving, meal prep, and laundry.    PERSONAL FACTORS Behavior pattern, Past/current experiences, Time since onset of injury/illness/exacerbation, and 3+ comorbidities: PD, chronic systolic CHF, diabetes, HTN, HLD, mild persistent asthma, cervical dystonia, chronic kidney disease stage III, peripheral neuropathy  are also affecting patient's functional outcome.      REHAB POTENTIAL: Good   CLINICAL DECISION MAKING: Evolving/moderate complexity   EVALUATION COMPLEXITY: Moderate   PLAN: PT FREQUENCY: 2x/week   PT DURATION: 12 weeks   PLANNED INTERVENTIONS: Therapeutic exercises, Therapeutic activity, Neuromuscular re-education, Balance training, Gait training, Patient/Family education, Stair training, Vestibular training, Canalith repositioning, and DME instructions   PLAN FOR NEXT SESSION:  Smooth pursuits, try seated VOR x1. Balance with vision removed, stepping strategies for balance, SLS.  SciFit/Nustep for strengthening/endurance.  Sit <>stands on uneven surface for immediate standing balance. Continue to re-iterate using rollator/RW at all times for balance.   [name], PT, DPT 01/11/22 12:52 PM

## 2022-01-11 NOTE — Patient Instructions (Signed)
   Memory Compensation Strategies  Use "WARM" strategy. W= write it down A=  associate it R=  repeat it M=  make a mental picture  You can keep a Memory Notebook. Use a 3-ring notebook with sections for the following:  calendar, important names and phone numbers, medications, doctors' names/phone numbers, "to do list"/reminders, and a section to journal what you did each day  Use a calendar to write appointments down.  Write yourself a schedule for the day/make a routine This can be placed on the calendar or in a separate section of the Memory Notebook.  Keeping a regular schedule can help memory.  Use medication organizer with sections for each day or morning/evening pills  You may need help loading it  Keep a basket, or pegboard by the door.   Place items that you need to take out with you in the basket or on the pegboard.  You may also want to include a message board for reminders.  Use sticky notes. Place sticky notes with reminders in a place where the task is performed.  For example:  "turn off the stove" placed by the stove, "lock the door" placed on the door at eye level, "take your medications" on the bathroom mirror or by the place where you normally take your medications  Use alarms/timers.  Use while cooking to remind yourself to check on food or as a reminder to take your medicine, or as a reminder to make a call, or as a reminder to perform another task, etc.  Use a voice memo to record important information and notes for yourself.  10.  Organize and reduce clutter     Keeping Thinking Skills Sharp: 1. Jigsaw puzzles 2. Card/board games 3. Talking on the phone/social events 4. Lumosity.[email or web address] 5. Online games 6. Word serches/crossword puzzles 7.  Logic puzzles 8. Aerobic exercise (stationary bike) 9. Eating balanced diet (fruits & veggies) 10. Drink water 11. Try something new--new recipe, hobby 12. Crafts 13. Do a variety of activities that are  challenging 14.  Plan weekly meals and write a grocery list 15. Add cognitive activities to walking/exercising (think of animal/food/city with each letter of the alphabet, counting backwards, thinking of as many vegetables as you can, etc.).--Only do this  If safe (no freezing/falls).

## 2022-01-12 ENCOUNTER — Telehealth: Payer: Self-pay | Admitting: *Deleted

## 2022-01-12 NOTE — Chronic Care Management (AMB) (Signed)
  Care Coordination Note  01/12/2022 Name: Veronica Shaw MRN: [id number] DOB: [birth year]  Veronica Shaw is a 71 y.o. year old female who is a primary care patient of Lyndee Hensen, DO and is actively engaged with the care management team. I reached out to Terie Purser by phone today to assist with re-scheduling a follow up visit with the RN Case Manager  Follow up plan: Unsuccessful telephone outreach attempt made. A HIPAA compliant phone message was left for the patient providing contact information and requesting a return call.  The care management team will reach out to the patient again over the next 7 days.  If patient returns call to provider office, please advise to call [facility] [place] at [phone].  [facility] Management  Direct Dial: [phone]

## 2022-01-12 NOTE — Chronic Care Management (AMB) (Signed)
  Care Coordination Note  01/12/2022 Name: Zyaire Dumas MRN: [id number] DOB: 1951/07/27  Loida Lorna Strother is a 71 y.o. year old female who is a primary care patient of Lyndee Hensen, DO and is actively engaged with the care management team. I reached out to Terie Purser by phone today to assist with re-scheduling a follow up visit with the RN Case Manager  Follow up plan: Telephone appointment with care management team member scheduled for:01/28/22  [facility] Management  Direct Dial: [phone]

## 2022-01-13 ENCOUNTER — Ambulatory Visit: Payer: Medicare Other | Admitting: Physical Therapy

## 2022-01-13 ENCOUNTER — Encounter: Payer: Self-pay | Admitting: Physical Therapy

## 2022-01-13 ENCOUNTER — Ambulatory Visit: Payer: Medicare Other | Admitting: Occupational Therapy

## 2022-01-13 ENCOUNTER — Encounter: Payer: Self-pay | Admitting: Occupational Therapy

## 2022-01-13 DIAGNOSIS — M25612 Stiffness of left shoulder, not elsewhere classified: Secondary | ICD-10-CM

## 2022-01-13 DIAGNOSIS — M25622 Stiffness of left elbow, not elsewhere classified: Secondary | ICD-10-CM

## 2022-01-13 DIAGNOSIS — M25611 Stiffness of right shoulder, not elsewhere classified: Secondary | ICD-10-CM | POA: Diagnosis not present

## 2022-01-13 DIAGNOSIS — R208 Other disturbances of skin sensation: Secondary | ICD-10-CM

## 2022-01-13 DIAGNOSIS — R2689 Other abnormalities of gait and mobility: Secondary | ICD-10-CM | POA: Diagnosis not present

## 2022-01-13 DIAGNOSIS — R278 Other lack of coordination: Secondary | ICD-10-CM

## 2022-01-13 DIAGNOSIS — R2681 Unsteadiness on feet: Secondary | ICD-10-CM

## 2022-01-13 DIAGNOSIS — R29818 Other symptoms and signs involving the nervous system: Secondary | ICD-10-CM

## 2022-01-13 DIAGNOSIS — Z9181 History of falling: Secondary | ICD-10-CM | POA: Diagnosis not present

## 2022-01-13 DIAGNOSIS — M25621 Stiffness of right elbow, not elsewhere classified: Secondary | ICD-10-CM

## 2022-01-13 NOTE — Patient Instructions (Addendum)
(  Exercise) Monday Tuesday Wednesday Thursday Friday Saturday Sunday   Sitting Cane exercises            PWR! Up in sitting           Lying down with paper towel           PWR! Hands           Coordination exercises                                                                              Ways to Prevent Future Parkinson's-Related Complications:  1.   EXERCISE regularly.  Perform your therapy exercises and incorporate safe aerobic exercise when possible (swimming, stationary bike, arm bike, seated stepper)  2.   Keep you FEET APART when you are standing (or sitting) to allow you to have better balance and reach further (which can help with shoulder rigidity).  Also make sure your feet are apart when you are sitting before you stand up.  3.   Focus on BIGGER movements during daily activities--deliberately reach overhead, straighten elbows and extend fingers  4.   Anytime you reach or move shoulder, make sure you have good upright POSTURE.  5.   When dressing or reaching for your seatbelt make sure to use your body to assist by TWISTING and LOOKING at where you are reaching while you reach--this can help to minimize stress on the shoulder and reduce the risk of a rotator cuff tear, gives visual feedback to the brain about movement size and where your arm is in space, makes eye muscles more active, and helps with neck/trunk rigidity  6.  When you reach for something overhead, make sure your THUMB is facing UP.  This is a better position for your shoulder.  7.  SWING YOUR ARMS when you walk! (If able).  People with PD are at increased risk for frozen shoulder and swinging your arms can reduce this risk.

## 2022-01-13 NOTE — Therapy (Signed)
OUTPATIENT OCCUPATIONAL THERAPY TREATMENT NOTE   Patient Name: Veronica Shaw MRN: [id number] DOB:[birth year], 71 y.o., female Today's Date: 01/13/2022  PCP: Dr. Susa Simmonds REFERRING PROVIDER: Dr. Carles Collet   END OF SESSION:   OT End of Session - 01/13/22 1012     Visit Number 8    Number of Visits 25    Date for OT Re-Evaluation 02/23/22    Authorization Type Medicare    Authorization Time Period POC written for 12 weeks, may d/c after 8 weeks    Authorization - Visit Number 8    Authorization - Number of Visits 10    Progress Note Due on Visit 10    OT Start Time 5732   pt in restroom between OT/PT   OT Stop Time 1102    OT Time Calculation (min) 39 min    Activity Tolerance Patient tolerated treatment well    Behavior During Therapy [facility] for tasks assessed/performed                         Past Medical History:  Diagnosis Date   Acute on chronic systolic CHF (congestive heart failure), NYHA class 4 ([place]) 10/19/2020   Asthma    Back pain 09/18/2019   Cervical disc disorder with radiculopathy of cervical region 09/02/5425   Chronic systolic CHF (congestive heart failure) ([place])    a. cMRI 4/15: EF 34% and findings - c/w NICM, normal RV size and function (RVEF 61%), Mild MR // b. Echo 2/15:  EF 30-35%, diff HK, ant-sept AK, Gr 2 DD, mild MR, trivial TR  //  c. Echo 5/17: EF 20-25%, severe diffuse HK, marked systolic dyssynchrony, grade 1 diastolic dysfunction, mild MR  //  d. [place] 5/17: Fick CO 2.9, RVSP 19, PASP 15, PW mean 2, low filing pressures and preserved CO    Cognitive impairment 10/19/2017   MOCA was administered with a score of 23/30   Diabetes mellitus    Diabetic peripheral neuropathy ([place]) 12/09/2011   Dyspnea 09/19/2012   Flu 10/17/2017   Gastritis    History of echocardiogram    Echo 6/18: EF 30-35, diffuse HK, grade 1 diastolic dysfunction, trivial MR, mild LAE, mild TR, no pericardial effusion   History of nuclear stress test    Myoview 5/18: EF 49,  no ischemia, inferoseptal defect c/w LBBB artifact (intermediate risk due to EF < 50).   HTN (hypertension)    Hyperlipidemia    Hyperlipidemia associated with type 2 diabetes mellitus ([place]) 03/05/2019   Hypertension associated with diabetes ([place]) 09/28/2006   Qualifier: Diagnosis of  By: [name]     Major depression 03/31/2013   Melena 08/21/2020   NICM (nonischemic cardiomyopathy) ([place])    a. Nuclear 5/13: Normal stress nuclear study. LV Ejection Fraction: 58%  //  b. LHC 10/14: Minor luminal irregularity in prox LAD, EF 35%    Parkinson disease (HCC)    Plantar fasciitis    Plantar fasciitis, bilateral 06/15/2012   Rosacea 05/29/2009   Qualifier: Diagnosis of  By: [name] MD, [name] hearing loss (SNHL), bilateral 01/04/2017   Sleep apnea    was retested and no longer had it and so d/c CPAP   Syncope    Thoracic or lumbosacral neuritis or radiculitis, unspecified 07/03/2013   Tinnitus, bilateral 01/04/2017   Type II diabetes mellitus with complication ([place]) 0/62/3762   Diabetic eye exam done by [facility]: Dr. Prudencio Burly on 11/08/13: no  diabetic retinopathy. Repeat in 1 year     Urticaria    Past Surgical History:  Procedure Laterality Date   BREAST EXCISIONAL BIOPSY Left 1970   benign cyst   BREAST SURGERY Left    BUNIONECTOMY     CARDIAC CATHETERIZATION N/A 12/16/2015   Procedure: Right Heart Cath;  Surgeon: Sherren Mocha, MD;  Location: [facility];  Service: Cardiovascular;  Laterality: N/A;   CHOLECYSTECTOMY     eye lid surgery Bilateral 05/09/2019   IMPLANTABLE CARDIOVERTER DEFIBRILLATOR IMPLANT  11-25-13   MDT dual chamber ICD implanted by [name] for primary prevention   IMPLANTABLE CARDIOVERTER DEFIBRILLATOR IMPLANT N/A 11/25/2013   Procedure: IMPLANTABLE CARDIOVERTER DEFIBRILLATOR IMPLANT;  Surgeon: [name], MD;  Location: [facility] CATH LAB;  Service: Cardiovascular;  Laterality: N/A;   LEFT AND RIGHT HEART CATHETERIZATION WITH  CORONARY ANGIOGRAM N/A 05/31/2013   Procedure: LEFT AND RIGHT HEART CATHETERIZATION WITH CORONARY ANGIOGRAM;  Surgeon: [name], MD;  Location: [facility] CATH [facility];  Service: Cardiovascular;  Laterality: N/A;   TONSILLECTOMY     TUBAL LIGATION     Patient Active Problem List   Diagnosis Date Noted   Type 2 diabetes mellitus with diabetic polyneuropathy, without long-term current use of insulin ([place]) 09/06/2021   Type 2 diabetes mellitus with stage 3a chronic kidney disease, with long-term current use of insulin ([place]) 09/06/2021   Dyslipidemia 09/06/2021   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) 08/19/2021   Orthostatic hypotension 07/27/2021   Mood changes 07/27/2021   Weight loss 05/07/2021   Acute on chronic systolic CHF (congestive heart failure), NYHA class 4 ([place]) 10/19/2020   Chronic HFrEF (heart failure with reduced ejection fraction) ([place])    Cervical dystonia 10/07/2020   Advanced care planning/counseling discussion 08/21/2020   Dementia without behavioral disturbance ([place]) 08/21/2020   Vision impairment, Right Eye 08/21/2020   Chronic kidney disease, stage 3a ([place]) 08/18/2020   Chronic rhinitis 12/18/2019   Chronic respiratory failure with hypoxia ([place]) 12/18/2019   Hyperlipidemia associated with type 2 diabetes mellitus ([place]) 03/05/2019   Cervical radiculopathy 10/24/2018   Falls frequently    Asthma 08/03/2017   Osteopenia 07/10/2015   Mild persistent asthma in adult without complication 24/26/8341   NICM (nonischemic cardiomyopathy) ([place]) 04/23/2014   Arthritis or polyarthritis, rheumatoid ([place]) 03/12/2014   ICD (implantable cardioverter-defibrillator), dual, in situ 03/06/2014   Heart Failure with Recovered Ejection Fraction, G2DD 05/31/2013   Parkinson disease ([place]) 05/21/2013   Vertigo 03/03/2013   Gastroesophageal reflux disease without esophagitis 09/27/2012   Trigger point with neck pain 04/12/2012   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   Diabetic  peripheral neuropathy ([place]) 12/09/2011   Chronic urticaria 05/27/2011   Sinus tachycardia ([place]) 05/27/2011   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) 09/28/2006    ONSET DATE: 11/26/21   REFERRING DIAG: Parkinson's Disease  THERAPY DIAG:  Other symptoms and signs involving the nervous system  Other lack of coordination  Other disturbances of skin sensation  Stiffness of right shoulder, not elsewhere classified  Stiffness of left elbow, not elsewhere classified  Unsteadiness on feet  Other abnormalities of gait and mobility   PERTINENT HISTORY: Patient is a 71 year old female referred to [facility] for PD.  Pt received PT in 2021 but no OT.   PMH is significant for:  PD, chronic systolic CHF, diabetes, HTN, HLD, mild persistent asthma, cervical dystonia, chronic  kidney disease stage III, peripheral neuropathy, pacemaker / ICD  PRECAUTIONS: impaired sensation both hands due to neuropathy, pacemaker/ICD, fall risk   SUBJECTIVE: Pt reports that she went to the [facility] for a little while and enjoyed it, but stopped because she was having difficulty keeping up in the classes (regular classes)   PAIN:  Are you having pain? Yes: NPRS scale: 4/10 Pain location: shoulders, neck, hands, knees Pain description: aching Aggravating factors: turning neck, walking (knees) Relieving factors: unknown     OBJECTIVE:   TODAY'S TREATMENT:   Pt educated in Appropriate PD community resources; Ways to decr risk of future complications related to PD; PD exercise chart and how to use and handouts provided   PATIENT EDUCATION: Education details: Appropriate PD community resources; Ways to decr risk of future complications related to PD; PD exercise chart and how to use  Person educated: Patient Education method: Explanation, handout Education comprehension: verbalized understanding     HOME  EXERCISE PROGRAM: Basic Coordination, PWR! Hands 12/16/21:  Seated PWR! Up, closed-chain shoulder flex  01/11/22:  Memory Compensation Strategies, Keeping Thinking Skills Sharp 01/13/22:  Appropriate PD community resources; Ways to decr risk of future complications related to PD; PD exercise chart     GOALS:   SHORT TERM GOALS: Target date: 12/29/2021   I with PD specific HEP  Goal status:  ongoing, would benefit from review   2.  Pt will verbalize understanding of adapted strategies to maximize safety and I with ADLs/ IADLs.   Goal status:ongoing, needs review   3.  Pt will demonstrate improved fine motor coordination for ADLs as evidenced by decreasing bilateral 9 hole peg test score by 5 secs   Baseline: RUE 71 secs, LUE 90 secs Goal status: MET RUE 65, LUE 69 secs   4.  Pt will demonstrate improved ease with feeding as evidenced by decreasing PPT#2 (self feeding) by 3 secs Baseline: 16.37 secs Goal status: ongoing met 1/2 trials, 14.79, 12.50   5.  Pt will demonstrate improved bilateral UE functional use as evidenced by increasing bilateral box/ blocks score by 3 blocks. Baseline: RUE 31 blocks, 32 blocks Goal status:met for RUE, ongoing for left, RUE 37 blocks, 30 blocks   6. Pt will demonstrate understanding of memory compensations and ways to keep thinking skills sharp      Goal status: ongoing.  Issued and reviewed 01/11/22       LONG TERM GOALS: Target date: 02/23/2022   Pt will verbalize understanding of ways to prevent future PD related complications and PD community resources.   Goal status: ONGOING issued 01/13/22 (may need review)   2.  Pt will demonstrate ability to retrieve a lightweight object at 115 shoulder flexion and -35 elbow extension with RUE.   Baseline: 110, -40 Goal status: INITIAL   3.  Pt will demonstrate ability to retrieve a lightweight object at 90 shoulder flexion and -30 elbow extension with LUE   Baseline: 85, -30 Goal status: INITIAL   4.   Pt will demonstrate improved ease with dressing as evidenced by decreasing PPT#4, donn/ doff jacket in 16 sec or less. Baseline: 19.53 Goal status: INITIAL,   5. Pt will demonstrate ability to complete 3 button/ unbutton in 1 min 40 secs or less. Baseline:  I min 47 secs Goal status: INITIAL          ASSESSMENT:   CLINICAL IMPRESSION:  Pt verbalized understanding of education provided, but would benefit from review of ways to decr risk  of precautions and practice in functional context to incr carryover.   PERFORMANCE DEFICITS in functional skills including ADLs, IADLs, coordination, dexterity, sensation, tone, ROM, strength, flexibility, FMC, GMC, mobility, balance, decreased knowledge of precautions, decreased knowledge of use of DME, vision, and UE functional use, cognitive skills including attention, memory, problem solving, safety awareness, thought, and understand, and psychosocial skills including coping strategies, environmental adaptation, and routines and behaviors.    IMPAIRMENTS are limiting patient from ADLs, IADLs, rest and sleep, play, leisure, and social participation.    COMORBIDITIES may have co-morbidities  that affects occupational performance. Patient will benefit from skilled OT to address above impairments and improve overall function.   MODIFICATION OR ASSISTANCE TO COMPLETE EVALUATION: Min-Moderate modification of tasks or assist with assess necessary to complete an evaluation.    OT OCCUPATIONAL PROFILE AND HISTORY: Detailed assessment: Review of records and additional review of physical, cognitive, psychosocial history related to current functional performance.   CLINICAL DECISION MAKING: Moderate - several treatment options, min-mod task modification necessary   REHAB POTENTIAL: Good   EVALUATION COMPLEXITY: Moderate       PLAN: OT FREQUENCY: 2x/week   OT DURATION: 12 weeks   PLANNED INTERVENTIONS: self care/ADL training, therapeutic exercise,  therapeutic activity, neuromuscular re-education, manual therapy, passive range of motion, gait training, balance training, functional mobility training, splinting, ultrasound, paraffin, fluidotherapy, moist heat, cryotherapy, contrast bath, patient/family education, cognitive remediation/compensation, visual/perceptual remediation/compensation, energy conservation, coping strategies training, and DME and/or AE instructions   RECOMMENDED OTHER SERVICES: n/a   CONSULTED AND AGREED WITH PLAN OF CARE: Patient   PLAN FOR NEXT SESSION:  review/check on exercise flowsheet and ways to decr risk of precautions within functional tasks; functional reaching, ADL strategies    Vianne Bulls, OTR/L [facility] [address] [place], [place]  [address] [phone] phone [phone] 01/13/22 12:14 PM

## 2022-01-13 NOTE — Therapy (Addendum)
OUTPATIENT PHYSICAL THERAPY TREATMENT NOTE   Patient Name: Veronica Shaw MRN: [id number] DOB:July 05, 1951, 71 y.o., female Today's Date: 01/13/2022  PCP: Lyndee Hensen, DO REFERRING PROVIDER: Ludwig Clarks, DO   END OF SESSION:   PT End of Session - 01/13/22 0935     Visit Number 12    Number of Visits 17    Date for PT Re-Evaluation 02/01/22    Authorization Type Medicare    Progress Note Due on Visit 10   performed on visit 9   PT Start Time 0933    PT Stop Time 1014    PT Time Calculation (min) 41 min    Activity Tolerance Patient tolerated treatment well    Behavior During Therapy WFL for tasks assessed/performed                 Past Medical History:  Diagnosis Date   Acute on chronic systolic CHF (congestive heart failure), NYHA class 4 ([place]) 10/19/2020   Asthma    Back pain 09/18/2019   Cervical disc disorder with radiculopathy of cervical region 0/16/0109   Chronic systolic CHF (congestive heart failure) ([place])    a. cMRI 4/15: EF 34% and findings - c/w NICM, normal RV size and function (RVEF 61%), Mild MR // b. Echo 2/15:  EF 30-35%, diff HK, ant-sept AK, Gr 2 DD, mild MR, trivial TR  //  c. Echo 5/17: EF 20-25%, severe diffuse HK, marked systolic dyssynchrony, grade 1 diastolic dysfunction, mild MR  //  d. [place] 5/17: Fick CO 2.9, RVSP 19, PASP 15, PW mean 2, low filing pressures and preserved CO    Cognitive impairment 10/19/2017   MOCA was administered with a score of 23/30   Diabetes mellitus    Diabetic peripheral neuropathy ([place]) 12/09/2011   Dyspnea 09/19/2012   Flu 10/17/2017   Gastritis    History of echocardiogram    Echo 6/18: EF 30-35, diffuse HK, grade 1 diastolic dysfunction, trivial MR, mild LAE, mild TR, no pericardial effusion   History of nuclear stress test    Myoview 5/18: EF 49, no ischemia, inferoseptal defect c/w LBBB artifact (intermediate risk due to EF < 50).   HTN (hypertension)    Hyperlipidemia    Hyperlipidemia associated with  type 2 diabetes mellitus ([place]) 03/05/2019   Hypertension associated with diabetes ([place]) 09/28/2006   Qualifier: Diagnosis of  By: [name]     Major depression 03/31/2013   Melena 08/21/2020   NICM (nonischemic cardiomyopathy) ([place])    a. Nuclear 5/13: Normal stress nuclear study. LV Ejection Fraction: 58%  //  b. LHC 10/14: Minor luminal irregularity in prox LAD, EF 35%    Parkinson disease (HCC)    Plantar fasciitis    Plantar fasciitis, bilateral 06/15/2012   Rosacea 05/29/2009   Qualifier: Diagnosis of  By: [name] MD, [name] hearing loss (SNHL), bilateral 01/04/2017   Sleep apnea    was retested and no longer had it and so d/c CPAP   Syncope    Thoracic or lumbosacral neuritis or radiculitis, unspecified 07/03/2013   Tinnitus, bilateral 01/04/2017   Type II diabetes mellitus with complication ([place]) 10/22/5571   Diabetic eye exam done by [facility]: Dr. Prudencio Burly on 11/08/13: no diabetic retinopathy. Repeat in 1 year     Urticaria    Past Surgical History:  Procedure Laterality Date   BREAST EXCISIONAL BIOPSY Left 1970   benign cyst   BREAST SURGERY Left    BUNIONECTOMY  CARDIAC CATHETERIZATION N/A 12/16/2015   Procedure: Right Heart Cath;  Surgeon: Sherren Mocha, MD;  Location: [facility];  Service: Cardiovascular;  Laterality: N/A;   CHOLECYSTECTOMY     eye lid surgery Bilateral 05/09/2019   IMPLANTABLE CARDIOVERTER DEFIBRILLATOR IMPLANT  11-25-13   MDT dual chamber ICD implanted by [name] for primary prevention   IMPLANTABLE CARDIOVERTER DEFIBRILLATOR IMPLANT N/A 11/25/2013   Procedure: IMPLANTABLE CARDIOVERTER DEFIBRILLATOR IMPLANT;  Surgeon: [name], MD;  Location: [facility] CATH LAB;  Service: Cardiovascular;  Laterality: N/A;   LEFT AND RIGHT HEART CATHETERIZATION WITH CORONARY ANGIOGRAM N/A 05/31/2013   Procedure: LEFT AND RIGHT HEART CATHETERIZATION WITH CORONARY ANGIOGRAM;  Surgeon: [name], MD;  Location: [facility] CATH LAB;   Service: Cardiovascular;  Laterality: N/A;   TONSILLECTOMY     TUBAL LIGATION     Patient Active Problem List   Diagnosis Date Noted   Type 2 diabetes mellitus with diabetic polyneuropathy, without long-term current use of insulin ([place]) 09/06/2021   Type 2 diabetes mellitus with stage 3a chronic kidney disease, with long-term current use of insulin ([place]) 09/06/2021   Dyslipidemia 09/06/2021   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) 08/19/2021   Orthostatic hypotension 07/27/2021   Mood changes 07/27/2021   Weight loss 05/07/2021   Acute on chronic systolic CHF (congestive heart failure), NYHA class 4 ([place]) 10/19/2020   Chronic HFrEF (heart failure with reduced ejection fraction) ([place])    Cervical dystonia 10/07/2020   Advanced care planning/counseling discussion 08/21/2020   Dementia without behavioral disturbance ([place]) 08/21/2020   Vision impairment, Right Eye 08/21/2020   Chronic kidney disease, stage 3a ([place]) 08/18/2020   Chronic rhinitis 12/18/2019   Chronic respiratory failure with hypoxia ([place]) 12/18/2019   Hyperlipidemia associated with type 2 diabetes mellitus ([place]) 03/05/2019   Cervical radiculopathy 10/24/2018   Falls frequently    Asthma 08/03/2017   Osteopenia 07/10/2015   Mild persistent asthma in adult without complication 57/97/2820   NICM (nonischemic cardiomyopathy) ([place]) 04/23/2014   Arthritis or polyarthritis, rheumatoid ([place]) 03/12/2014   ICD (implantable cardioverter-defibrillator), dual, in situ 03/06/2014   Heart Failure with Recovered Ejection Fraction, G2DD 05/31/2013   Parkinson disease ([place]) 05/21/2013   Vertigo 03/03/2013   Gastroesophageal reflux disease without esophagitis 09/27/2012   Trigger point with neck pain 04/12/2012   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   Diabetic peripheral neuropathy ([place]) 12/09/2011   Chronic urticaria 05/27/2011   Sinus tachycardia ([place]) 05/27/2011   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     PRECAUTIONS: Fall, no driving, defibrillator   SUBJECTIVE: Signed up for some experimental procedure at [facility] to slow down the progression of PD. Has an appt in December. Had a bad day yesterday. No falls, but had some close calls when she was turning and walking with her cane. Has not tried the new exercise.     PAIN:  Are you having pain? Yes NPRS scale: 5/10 Pain location: Neck, bilat knees, arthritic pain  PAIN TYPE: aching, sharp, and throbbing    TODAY'S TREATMENT:  NuStep with  BLE/BUE for strengthening, activity tolerance, and reciprocal movement patterns at gear 3.0 (gear 4.0 was initially too much) 8 minutes.    NMR:  Standing inside // bars: With BUE support alternating forward stepping over 3 cones in a line, x10 reps each side, cues for weight shifting forwards and incr hip/knee flexion when stepping big back to midline.  Alternating SLS taps to 2 cones, x12 reps each leg without UE support, min guard for balance, pt more challenged with weight shifting to L and tends to have trunk lean, cued to stand tall through legs and for glute activation, PT helping to assist with midline positioning with SLS on L, pt improved with incr reps and needed less UE support Narrow BOS: EO 2 sets of 10 reps head turns (incr difficulty going to R due to stiffness), x10 reps head nods, pt with incr neck stiffness with up and down. Pt with limited head motion when performing. Pt tends to lean posteriorly and keep knees bent.  Very mild dizziness with head motions.  Retro gait down and back x3 reps, cues for hip hinge and incr step length posteriorly.  On air ex with feet apart EC 3 x 30 seconds, pt reporting feeling more unsteady vs. Dizziness.      GAIT: Gait pattern: step through pattern, decreased stride length, and narrow BOS Distance walked:  around clinic with session Assistive device utilized: Walker - 4 wheeled Level of assistance:  supervision  Comments: Initial cues for foot clearance.      PATIENT EDUCATION: Education details: Continue to use rollator/RW at home at all times as pt continues to have almost falls with cane.  Person educated: Patient Education method: Explanation  Education comprehension: verbalized understanding     HOME EXERCISE PROGRAM: [id number], Seated PWR moves, Walking program with rollator        GOALS: Goals reviewed with patient? Yes   SHORT TERM GOALS: Target date: 12/01/2021   Pt will be independent with initial HEP with family supervision in order to build upon functional gains made in therapy.   Baseline: Goal status: MET   2.  Pt will improve 5x sit<>stand to less than or equal to 33 sec without UE support to demonstrate improved functional strength and transfer efficiency.    Baseline:  39.65 without UE support; 25.41s without UE support  Goal status: MET   3.  Pt will improve gait speed with rollator to at least 1.3 ft/sec in order to demo improved household mobility/decr fall risk.    Baseline:  .98 ft/sec; 1.55 ft/s w/rollator  Goal status: MET   4.  Pt will perform condition 2 of mCTSIB for at least 30 sec in order to demo improved vestibular input for balance. Baseline: 19.6 sec; 30.34s w/S* Goal status: MET   5.  Pt will verbalize understanding of local Parkinson's disease resources, including options for community fitness. Baseline:  Goal status: PROGRESSING       Updated/ongoing LTGs for 12 week POC  LONG TERM GOALS: Target date:  02/01/2022   Pt will be independent with final HEP with family supervision in order to build upon functional gains made in therapy. Baseline:  Goal status: PROGRESSING   2.  Pt will recover posterior balance in push and release test in 1-2 or less steps independently, for improved balance recovery.  Baseline: anterior = 5-6 small steps, posterior = multiple and needs assist from therapist to maintain balance.  On 12/30/21, anterior = 1 step, posterior= 2-3 steps Goal status:  REVISED    3.  Pt will perform condition 4 of mCTSIB for at least 13 sec in order to demo improved vestibular input for balance. Baseline: 2 sec; 9.7 seconds on 12/30/21 Goal status: REVISED   4.  Pt will improve gait speed with rollator to at least 1.5 ft/sec in order to demo improved household mobility/decr fall risk.  Baseline: .98 ft/sec w/ rollator  27.22 seconds = 1.20 ft/sec on 12/30/21 Goal status: REVISED   5.  Pt will improve 5x sit<>stand to less than or equal to 25 sec without UE support to demonstrate improved functional strength and transfer efficiency.  Baseline: 39.65 sec without UE support  29.12 seconds on 12/30/21 Goal status: REVISED   ASSESSMENT:   CLINICAL IMPRESSION: Today's skilled session focused on BLE strengthening and activity tolerance and balance strategies with SLS tasks, head motions, retro gait, and eyes closed. Pt more challenged with SLS tasks on LLE and has more trunk lean to the L. With eyes closed on air ex and feet apart, pt much more steady during last 2 reps and did not have to grab onto bars for balance.  Will continue to progress towards LTGs.      OBJECTIVE IMPAIRMENTS Abnormal gait, decreased activity tolerance, decreased balance, decreased coordination, decreased endurance, decreased knowledge of use of DME, difficulty walking, decreased strength, dizziness, impaired tone, postural dysfunction, and pain.    ACTIVITY LIMITATIONS cleaning, community activity, driving, meal  prep, and laundry.    PERSONAL FACTORS Behavior pattern, Past/current experiences, Time since onset of injury/illness/exacerbation, and 3+ comorbidities: PD, chronic systolic CHF, diabetes, HTN, HLD, mild persistent asthma, cervical dystonia, chronic kidney disease stage III, peripheral neuropathy  are also affecting patient's functional outcome.      REHAB POTENTIAL: Good   CLINICAL DECISION MAKING: Evolving/moderate complexity   EVALUATION COMPLEXITY: Moderate   PLAN: PT FREQUENCY: 2x/week   PT DURATION: 12 weeks   PLANNED INTERVENTIONS: Therapeutic exercises, Therapeutic activity, Neuromuscular re-education, Balance training, Gait training, Patient/Family education, Stair training, Vestibular training, Canalith repositioning, and DME instructions   PLAN FOR NEXT SESSION:  Smooth pursuits, try seated VOR x1. Balance with vision removed, stepping strategies for balance, SLS.  SciFit/Nustep for strengthening/endurance.  Sit <>stands on uneven surface for immediate standing balance. Continue to re-iterate using rollator/RW at all times for balance.   [name], PT, DPT 01/13/22 10:18 AM

## 2022-01-14 NOTE — Progress Notes (Signed)
No ICM remote transmission received for 01/10/2022 and next ICM transmission scheduled for 01/24/2022.   

## 2022-01-18 ENCOUNTER — Ambulatory Visit: Payer: Medicare Other | Admitting: Occupational Therapy

## 2022-01-18 ENCOUNTER — Ambulatory Visit: Payer: Medicare Other

## 2022-01-18 DIAGNOSIS — R29818 Other symptoms and signs involving the nervous system: Secondary | ICD-10-CM | POA: Diagnosis not present

## 2022-01-18 DIAGNOSIS — R2681 Unsteadiness on feet: Secondary | ICD-10-CM | POA: Diagnosis not present

## 2022-01-18 DIAGNOSIS — R278 Other lack of coordination: Secondary | ICD-10-CM

## 2022-01-18 DIAGNOSIS — R2689 Other abnormalities of gait and mobility: Secondary | ICD-10-CM | POA: Diagnosis not present

## 2022-01-18 DIAGNOSIS — R208 Other disturbances of skin sensation: Secondary | ICD-10-CM | POA: Diagnosis not present

## 2022-01-18 DIAGNOSIS — M25622 Stiffness of left elbow, not elsewhere classified: Secondary | ICD-10-CM

## 2022-01-18 DIAGNOSIS — M25611 Stiffness of right shoulder, not elsewhere classified: Secondary | ICD-10-CM

## 2022-01-18 DIAGNOSIS — Z9181 History of falling: Secondary | ICD-10-CM | POA: Diagnosis not present

## 2022-01-18 DIAGNOSIS — M25612 Stiffness of left shoulder, not elsewhere classified: Secondary | ICD-10-CM

## 2022-01-18 DIAGNOSIS — M25621 Stiffness of right elbow, not elsewhere classified: Secondary | ICD-10-CM

## 2022-01-18 NOTE — Therapy (Signed)
OUTPATIENT PHYSICAL THERAPY TREATMENT NOTE   Patient Name: Veronica Shaw MRN: [id number] DOB:June 06, 1951, 71 y.o., female Today's Date: 01/18/2022  PCP: Lyndee Hensen, DO REFERRING PROVIDER: Ludwig Clarks, DO   END OF SESSION:   PT End of Session - 01/18/22 0924     Visit Number 13    Number of Visits 17    Date for PT Re-Evaluation 02/01/22    Authorization Type Medicare    Progress Note Due on Visit 10    PT Start Time 0928    PT Stop Time 1010    PT Time Calculation (min) 42 min    Activity Tolerance Patient tolerated treatment well    Behavior During Therapy WFL for tasks assessed/performed             Past Medical History:  Diagnosis Date   Acute on chronic systolic CHF (congestive heart failure), NYHA class 4 ([place]) 10/19/2020   Asthma    Back pain 09/18/2019   Cervical disc disorder with radiculopathy of cervical region 1/88/4166   Chronic systolic CHF (congestive heart failure) ([place])    a. cMRI 4/15: EF 34% and findings - c/w NICM, normal RV size and function (RVEF 61%), Mild MR // b. Echo 2/15:  EF 30-35%, diff HK, ant-sept AK, Gr 2 DD, mild MR, trivial TR  //  c. Echo 5/17: EF 20-25%, severe diffuse HK, marked systolic dyssynchrony, grade 1 diastolic dysfunction, mild MR  //  d. [place]i 5/17: Fick CO 2.9, RVSP 19, PASP 15, PW mean 2, low filing pressures and preserved CO    Cognitive impairment 10/19/2017   MOCA was administered with a score of 23/30   Diabetes mellitus    Diabetic peripheral neuropathy ([place]) 12/09/2011   Dyspnea 09/19/2012   Flu 10/17/2017   Gastritis    History of echocardiogram    Echo 6/18: EF 30-35, diffuse HK, grade 1 diastolic dysfunction, trivial MR, mild LAE, mild TR, no pericardial effusion   History of nuclear stress test    Myoview 5/18: EF 49, no ischemia, inferoseptal defect c/w LBBB artifact (intermediate risk due to EF < 50).   HTN (hypertension)    Hyperlipidemia    Hyperlipidemia associated with type 2 diabetes mellitus ([place])  03/05/2019   Hypertension associated with diabetes ([place]) 09/28/2006   Qualifier: Diagnosis of  By: [name]     Major depression 03/31/2013   Melena 08/21/2020   NICM (nonischemic cardiomyopathy) ([place])    a. Nuclear 5/13: Normal stress nuclear study. LV Ejection Fraction: 58%  //  b. LHC 10/14: Minor luminal irregularity in prox LAD, EF 35%    Parkinson disease (HCC)    Plantar fasciitis    Plantar fasciitis, bilateral 06/15/2012   Rosacea 05/29/2009   Qualifier: Diagnosis of  By: [name] MD, [name] hearing loss (SNHL), bilateral 01/04/2017   Sleep apnea    was retested and no longer had it and so d/c CPAP   Syncope    Thoracic or lumbosacral neuritis or radiculitis, unspecified 07/03/2013   Tinnitus, bilateral 01/04/2017   Type II diabetes mellitus with complication ([place]) 0/63/0160   Diabetic eye exam done by [facility] Ophthalmology: Dr. Prudencio Burly on 11/08/13: no diabetic retinopathy. Repeat in 1 year     Urticaria    Past Surgical History:  Procedure Laterality Date   BREAST EXCISIONAL BIOPSY Left 1970   benign cyst   BREAST SURGERY Left    BUNIONECTOMY     CARDIAC CATHETERIZATION N/A 12/16/2015  Procedure: Right Heart Cath;  Surgeon: Sherren Mocha, MD;  Location: [facility];  Service: Cardiovascular;  Laterality: N/A;   CHOLECYSTECTOMY     eye lid surgery Bilateral 05/09/2019   IMPLANTABLE CARDIOVERTER DEFIBRILLATOR IMPLANT  11-25-13   MDT dual chamber ICD implanted by [name] for primary prevention   IMPLANTABLE CARDIOVERTER DEFIBRILLATOR IMPLANT N/A 11/25/2013   Procedure: IMPLANTABLE CARDIOVERTER DEFIBRILLATOR IMPLANT;  Surgeon: [name], MD;  Location: [facility] CATH LAB;  Service: Cardiovascular;  Laterality: N/A;   LEFT AND RIGHT HEART CATHETERIZATION WITH CORONARY ANGIOGRAM N/A 05/31/2013   Procedure: LEFT AND RIGHT HEART CATHETERIZATION WITH CORONARY ANGIOGRAM;  Surgeon: [name], MD;  Location: [facility] CATH LAB;  Service: Cardiovascular;   Laterality: N/A;   TONSILLECTOMY     TUBAL LIGATION     Patient Active Problem List   Diagnosis Date Noted   Type 2 diabetes mellitus with diabetic polyneuropathy, without long-term current use of insulin ([place]) 09/06/2021   Type 2 diabetes mellitus with stage 3a chronic kidney disease, with long-term current use of insulin ([place]) 09/06/2021   Dyslipidemia 09/06/2021   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) 08/19/2021   Orthostatic hypotension 07/27/2021   Mood changes 07/27/2021   Weight loss 05/07/2021   Acute on chronic systolic CHF (congestive heart failure), NYHA class 4 ([place]) 10/19/2020   Chronic HFrEF (heart failure with reduced ejection fraction) ([place])    Cervical dystonia 10/07/2020   Advanced care planning/counseling discussion 08/21/2020   Dementia without behavioral disturbance ([place]) 08/21/2020   Vision impairment, Right Eye 08/21/2020   Chronic kidney disease, stage 3a ([place]) 08/18/2020   Chronic rhinitis 12/18/2019   Chronic respiratory failure with hypoxia ([place]) 12/18/2019   Hyperlipidemia associated with type 2 diabetes mellitus ([place]) 03/05/2019   Cervical radiculopathy 10/24/2018   Falls frequently    Asthma 08/03/2017   Osteopenia 07/10/2015   Mild persistent asthma in adult without complication 93/81/0175   NICM (nonischemic cardiomyopathy) ([place]) 04/23/2014   Arthritis or polyarthritis, rheumatoid ([place]) 03/12/2014   ICD (implantable cardioverter-defibrillator), dual, in situ 03/06/2014   Heart Failure with Recovered Ejection Fraction, G2DD 05/31/2013   Parkinson disease ([place]) 05/21/2013   Vertigo 03/03/2013   Gastroesophageal reflux disease without esophagitis 09/27/2012   Trigger point with neck pain 04/12/2012   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   Diabetic peripheral neuropathy ([place]) 12/09/2011   Chronic urticaria 05/27/2011   Sinus tachycardia ([place]) 05/27/2011   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    PRECAUTIONS: Fall, no driving, defibrillator   SUBJECTIVE: Patient reports doing well, continues to report pain due to arthritis. Patient reports nearly falling off the commode last night, hitting her head on the wall. Denies HA, dizziness, N/V.      PAIN:  Are you having pain? Yes NPRS scale: 5/10 Pain location: Neck, bilat knees, arthritic pain  PAIN TYPE: aching, sharp, and throbbing    TODAY'S TREATMENT: NMR:  -VOR x1- seated horizontal (unable to do due to increased neck pain); tolerated vertical well  -VOR x1 vertical standing on foam x30s (very minimal ROM due to neck pain)  -Standing on foam toe  taps to cones B LE x3 cones (3x8 reps)  -STS on foam-> 1 LE on gumdrop, 1 on foam, 3x5 reps with no UE, CGA/SBA  -NuStep with BLE/BUE for strengthening, activity tolerance, and reciprocal movement patterns at gear 3.0  -in // bars:  -lateral stepping on foam balance beam with intermittent UE support   -fwd/backward tandem walking on foam balance beam with intermittent UE support (unable to take large enough step backward to get foot behind the other)  -a/p rockerboard with intermittent UE support (EO/EC) (increased posterior postural sway with EC)   GAIT: Gait pattern: step through pattern, decreased stride length, and narrow BOS Distance walked:  around clinic with session Assistive device utilized: Walker - 4 wheeled Level of assistance:  supervision  Comments: Initial cues for foot clearance.   PATIENT EDUCATION: Education details: Continue to use rollator/RW at home at all times as pt continues to have  almost falls with cane.  Person educated: Patient Education method: Explanation  Education comprehension: verbalized understanding     HOME EXERCISE PROGRAM: [id number], Seated PWR moves, Walking program with rollator        GOALS: Goals reviewed with patient? Yes   SHORT TERM GOALS: Target date: 12/01/2021   Pt will be independent with initial HEP with family supervision in order to build upon functional gains made in therapy.   Baseline: Goal status: MET   2.  Pt will improve 5x sit<>stand to less than or equal to 33 sec without UE support to demonstrate improved functional strength and transfer efficiency.    Baseline:  39.65 without UE support; 25.41s without UE support  Goal status: MET   3.  Pt will improve gait speed with rollator to at least 1.3 ft/sec in order to demo improved household mobility/decr fall risk.    Baseline:  .98 ft/sec; 1.55 ft/s w/rollator  Goal status: MET   4.  Pt will perform condition 2 of mCTSIB for at least 30 sec in order to demo improved vestibular input for balance. Baseline: 19.6 sec; 30.34s w/S* Goal status: MET   5.  Pt will verbalize understanding of local Parkinson's disease resources, including options for community fitness. Baseline:  Goal status: PROGRESSING       Updated/ongoing LTGs for 12 week POC  LONG TERM GOALS: Target date: 02/01/2022   Pt will be independent with final HEP with family supervision in order to build upon functional gains made in therapy. Baseline:  Goal status: PROGRESSING   2.  Pt will recover posterior balance in push and release test in 1-2 or less steps independently, for improved balance recovery.  Baseline: anterior = 5-6 small steps, posterior = multiple and needs assist from therapist to maintain balance.  On 12/30/21, anterior = 1 step, posterior= 2-3 steps Goal status: REVISED    3.  Pt will perform condition 4 of mCTSIB for at least 13 sec in order to demo improved vestibular input for  balance. Baseline: 2 sec; 9.7 seconds on 12/30/21 Goal status: REVISED   4.  Pt will improve gait speed with rollator to at least 1.5 ft/sec in order to demo improved household mobility/decr fall risk.  Baseline: .98 ft/sec w/ rollator  27.22 seconds = 1.20 ft/sec on 12/30/21 Goal status: REVISED   5.  Pt will improve 5x sit<>stand to less than or equal to 25 sec without UE support to demonstrate improved functional strength and transfer efficiency.  Baseline: 39.65 sec without UE support  29.12 seconds on 12/30/21 Goal status: REVISED  ASSESSMENT:   CLINICAL IMPRESSION: Patient seen for skilled PT session with emphasis on neuromuscular re-education and balance-retraining. She demonstrates very limited ability to challenge VOR in sitting or in standing due to significantly limited cervical ROM. Patient with fair/good balance during this task, but likely because she was not achieving a significant enough cervical rotation. Patient noted to have fair immediate standing balance when standing to uneven/forgiving surface. Increased reliance on UE noted with fair anticipatory balance. She continues to have posterior postural sway with delayed and inadequate balance strategies noted. Patient will continue to benefit from skilled PT services to improve anticipatory/reactionary and ambulatory balance. Continue POC.     OBJECTIVE IMPAIRMENTS Abnormal gait, decreased activity tolerance, decreased balance, decreased coordination, decreased endurance, decreased knowledge of use of DME, difficulty walking, decreased strength, dizziness, impaired tone, postural dysfunction, and pain.    ACTIVITY LIMITATIONS cleaning, community activity, driving, meal prep, and laundry.    PERSONAL FACTORS Behavior pattern, Past/current experiences, Time since onset of injury/illness/exacerbation, and 3+ comorbidities: PD, chronic systolic CHF, diabetes, HTN, HLD, mild persistent asthma, cervical dystonia, chronic kidney  disease stage III, peripheral neuropathy  are also affecting patient's functional outcome.      REHAB POTENTIAL: Good   CLINICAL DECISION MAKING: Evolving/moderate complexity   EVALUATION COMPLEXITY: Moderate   PLAN: PT FREQUENCY: 2x/week   PT DURATION: 12 weeks   PLANNED INTERVENTIONS: Therapeutic exercises, Therapeutic activity, Neuromuscular re-education, Balance training, Gait training, Patient/Family education, Stair training, Vestibular training, Canalith repositioning, and DME instructions   PLAN FOR NEXT SESSION:  Smooth pursuits, Balance with vision removed, stepping strategies for balance, SLS.  SciFit/Nustep for strengthening/endurance. Continue to re-iterate using rollator/RW at all times for balance. Anticipatory/reactionary balance   Debbora Dus, PT, DPT, CBIS 01/18/22 10:11 AM

## 2022-01-18 NOTE — Patient Instructions (Signed)
For increased safety with your bedside commode, consider backing it up against the dresser or wall or placing cuff weights on the legs to minimize tipping. We used 7 lbs cuff weights in the clinic on each leg of the bedside commode. You can also back it up against your locked rollator for safety.

## 2022-01-18 NOTE — Therapy (Signed)
OUTPATIENT OCCUPATIONAL THERAPY TREATMENT NOTE   Patient Name: Veronica Shaw MRN: [id number] DOB:Oct 31, 1950, 71 y.o., female Today's Date: 01/18/2022  PCP: Dr. Susa Simmonds REFERRING PROVIDER: Dr. Carles Collet   END OF SESSION:   OT End of Session - 01/18/22 1132     Visit Number 9    Number of Visits 25    Date for OT Re-Evaluation 02/23/22    Authorization Type Medicare    Authorization Time Period POC written for 12 weeks, may d/c after 8 weeks    Authorization - Visit Number 9    Authorization - Number of Visits 10    Progress Note Due on Visit 10    OT Start Time 1019    OT Stop Time 1100    OT Time Calculation (min) 41 min    Activity Tolerance Patient tolerated treatment well    Behavior During Therapy WFL for tasks assessed/performed                          Past Medical History:  Diagnosis Date   Acute on chronic systolic CHF (congestive heart failure), NYHA class 4 ([place]) 10/19/2020   Asthma    Back pain 09/18/2019   Cervical disc disorder with radiculopathy of cervical region 10/20/252   Chronic systolic CHF (congestive heart failure) ([place])    a. cMRI 4/15: EF 34% and findings - c/w NICM, normal RV size and function (RVEF 61%), Mild MR // b. Echo 2/15:  EF 30-35%, diff HK, ant-sept AK, Gr 2 DD, mild MR, trivial TR  //  c. Echo 5/17: EF 20-25%, severe diffuse HK, marked systolic dyssynchrony, grade 1 diastolic dysfunction, mild MR  //  d. [place] 5/17: Fick CO 2.9, RVSP 19, PASP 15, PW mean 2, low filing pressures and preserved CO    Cognitive impairment 10/19/2017   MOCA was administered with a score of 23/30   Diabetes mellitus    Diabetic peripheral neuropathy ([place]) 12/09/2011   Dyspnea 09/19/2012   Flu 10/17/2017   Gastritis    History of echocardiogram    Echo 6/18: EF 30-35, diffuse HK, grade 1 diastolic dysfunction, trivial MR, mild LAE, mild TR, no pericardial effusion   History of nuclear stress test    Myoview 5/18: EF 49, no ischemia, inferoseptal  defect c/w LBBB artifact (intermediate risk due to EF < 50).   HTN (hypertension)    Hyperlipidemia    Hyperlipidemia associated with type 2 diabetes mellitus ([place]) 03/05/2019   Hypertension associated with diabetes ([place]) 09/28/2006   Qualifier: Diagnosis of  By: [name]     Major depression 03/31/2013   Melena 08/21/2020   NICM (nonischemic cardiomyopathy) ([place])    a. Nuclear 5/13: Normal stress nuclear study. LV Ejection Fraction: 58%  //  b. LHC 10/14: Minor luminal irregularity in prox LAD, EF 35%    Parkinson disease (HCC)    Plantar fasciitis    Plantar fasciitis, bilateral 06/15/2012   Rosacea 05/29/2009   Qualifier: Diagnosis of  By: [name] MD, [name] hearing loss (SNHL), bilateral 01/04/2017   Sleep apnea    was retested and no longer had it and so d/c CPAP   Syncope    Thoracic or lumbosacral neuritis or radiculitis, unspecified 07/03/2013   Tinnitus, bilateral 01/04/2017   Type II diabetes mellitus with complication ([place]) 2/70/6237   Diabetic eye exam done by [facility]: Dr. Prudencio Burly on 11/08/13: no diabetic retinopathy. Repeat in 1  year     Urticaria    Past Surgical History:  Procedure Laterality Date   BREAST EXCISIONAL BIOPSY Left 1970   benign cyst   BREAST SURGERY Left    BUNIONECTOMY     CARDIAC CATHETERIZATION N/A 12/16/2015   Procedure: Right Heart Cath;  Surgeon: Sherren Mocha, MD;  Location: [facility];  Service: Cardiovascular;  Laterality: N/A;   CHOLECYSTECTOMY     eye lid surgery Bilateral 05/09/2019   IMPLANTABLE CARDIOVERTER DEFIBRILLATOR IMPLANT  11-25-13   MDT dual chamber ICD implanted by [name] for primary prevention   IMPLANTABLE CARDIOVERTER DEFIBRILLATOR IMPLANT N/A 11/25/2013   Procedure: IMPLANTABLE CARDIOVERTER DEFIBRILLATOR IMPLANT;  Surgeon: [name], MD;  Location: [facility] CATH LAB;  Service: Cardiovascular;  Laterality: N/A;   LEFT AND RIGHT HEART CATHETERIZATION WITH CORONARY ANGIOGRAM N/A  05/31/2013   Procedure: LEFT AND RIGHT HEART CATHETERIZATION WITH CORONARY ANGIOGRAM;  Surgeon: [name], MD;  Location: [facility] LAB;  Service: Cardiovascular;  Laterality: N/A;   TONSILLECTOMY     TUBAL LIGATION     Patient Active Problem List   Diagnosis Date Noted   Type 2 diabetes mellitus with diabetic polyneuropathy, without long-term current use of insulin ([place]) 09/06/2021   Type 2 diabetes mellitus with stage 3a chronic kidney disease, with long-term current use of insulin ([place]) 09/06/2021   Dyslipidemia 09/06/2021   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) 08/19/2021   Orthostatic hypotension 07/27/2021   Mood changes 07/27/2021   Weight loss 05/07/2021   Acute on chronic systolic CHF (congestive heart failure), NYHA class 4 ([place]) 10/19/2020   Chronic HFrEF (heart failure with reduced ejection fraction) ([place])    Cervical dystonia 10/07/2020   Advanced care planning/counseling discussion 08/21/2020   Dementia without behavioral disturbance ([place]) 08/21/2020   Vision impairment, Right Eye 08/21/2020   Chronic kidney disease, stage 3a ([place]) 08/18/2020   Chronic rhinitis 12/18/2019   Chronic respiratory failure with hypoxia ([place]) 12/18/2019   Hyperlipidemia associated with type 2 diabetes mellitus ([place]) 03/05/2019   Cervical radiculopathy 10/24/2018   Falls frequently    Asthma 08/03/2017   Osteopenia 07/10/2015   Mild persistent asthma in adult without complication 35/59/7416   NICM (nonischemic cardiomyopathy) ([place]) 04/23/2014   Arthritis or polyarthritis, rheumatoid ([place]) 03/12/2014   ICD (implantable cardioverter-defibrillator), dual, in situ 03/06/2014   Heart Failure with Recovered Ejection Fraction, G2DD 05/31/2013   Parkinson disease ([place]) 05/21/2013   Vertigo 03/03/2013   Gastroesophageal reflux disease without esophagitis 09/27/2012   Trigger point with neck pain 04/12/2012   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   Diabetic peripheral neuropathy  ([place]) 12/09/2011   Chronic urticaria 05/27/2011   Sinus tachycardia ([place]) 05/27/2011   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 05/20/2011   Type II diabetes mellitus with complication ([place]) 38/45/3646   Hypertension associated with diabetes ([place]) 09/28/2006    ONSET DATE: 11/26/21   REFERRING DIAG: Parkinson's Disease  THERAPY DIAG:  Other symptoms and signs involving the nervous system  Other lack of coordination  Other disturbances of skin sensation  Stiffness of right shoulder, not elsewhere classified  Stiffness of left elbow, not elsewhere classified  Unsteadiness on feet  Other abnormalities of gait and mobility   PERTINENT HISTORY: Patient is a 71 year old female referred to Neuro OPOT for PD.  Pt received PT in 2021 but no OT.   PMH is significant for:  PD, chronic systolic CHF, diabetes, HTN, HLD, mild persistent asthma, cervical dystonia, chronic kidney disease stage III, peripheral  neuropathy, pacemaker / ICD  PRECAUTIONS: impaired sensation both hands due to neuropathy, pacemaker/ICD, fall risk   SUBJECTIVE: Pt reports that she went to the [facility] for a little while and enjoyed it, but stopped because she was having difficulty keeping up in the classes (regular classes)   PAIN:  Are you having pain? Yes: NPRS scale: 4/10 Pain location: shoulders, neck, hands, knees Pain description: aching Aggravating factors: turning neck, walking (knees) Relieving factors: unknown     OBJECTIVE:   TODAY'S TREATMENT:   Treatment focused on ADL strategies today. Pt reports her BSC commode tipped and she almost fell. Therapist recommends that pt backs BSC up against wall, or dresser or that she places locked rollator or chair behind BSC for stability.  Pt was also shown use of 7 lbs cuff weight on all 4 legs of BSC for increased safety. Pt practiced pivot transfers to [facility] and she returned demonstration safely after practice and verbal cues for techniques.  (Pt was initially performing  incorrectly turning all the way around prior to instruction.)  Pt practiced zipping zippers with mod v.c and demonstration, then pt returned demo. Pt practiced fastening buttons min v.c and demonstration required. Pt opened containers of various sizes with big movements using shelf liner, min v.c  PATIENT EDUCATION: Education details: fastening buttons, zipping zipper and opening container with big movements, safety for transfers to and from [facility] Person educated: Patient Education method: Explanation, demonstration Education comprehension: verbalized understanding, returned demonstration, verbal cues     HOME EXERCISE PROGRAM: Investment banker, operational, PWR! Hands 12/16/21:  Seated PWR! Up, closed-chain shoulder flex  01/11/22:  Memory Compensation Strategies, Keeping Thinking Skills Sharp 01/13/22:  Appropriate PD community resources; Ways to decr risk of future complications related to PD; PD exercise chart     GOALS:   SHORT TERM GOALS: Target date: 12/29/2021   I with PD specific HEP  Goal status:  ongoing, would benefit from review   2.  Pt will verbalize understanding of adapted strategies to maximize safety and I with ADLs/ IADLs.   Goal status:ongoing, needs review   3.  Pt will demonstrate improved fine motor coordination for ADLs as evidenced by decreasing bilateral 9 hole peg test score by 5 secs   Baseline: RUE 71 secs, LUE 90 secs Goal status: MET RUE 65, LUE 69 secs   4.  Pt will demonstrate improved ease with feeding as evidenced by decreasing PPT#2 (self feeding) by 3 secs Baseline: 16.37 secs Goal status: ongoing met 1/2 trials, 14.79, 12.50   5.  Pt will demonstrate improved bilateral UE functional use as evidenced by increasing bilateral box/ blocks score by 3 blocks. Baseline: RUE 31 blocks, 32 blocks Goal status:met for RUE, ongoing for left, RUE 37 blocks, 30 blocks   6. Pt will demonstrate understanding of memory compensations and ways to keep thinking skills  sharp      Goal status: ongoing.  Issued and reviewed 01/11/22       LONG TERM GOALS: Target date: 02/23/2022   Pt will verbalize understanding of ways to prevent future PD related complications and PD community resources.   Goal status: ONGOING issued 01/13/22 (may need review)   2.  Pt will demonstrate ability to retrieve a lightweight object at 115 shoulder flexion and -35 elbow extension with RUE.   Baseline: 110, -40 Goal status: INITIAL   3.  Pt will demonstrate ability to retrieve a lightweight object at 90 shoulder flexion and -30 elbow extension with LUE  Baseline: 85, -30 Goal status: ongoing    4.  Pt will demonstrate improved ease with dressing as evidenced by decreasing PPT#4, donn/ doff jacket in 16 sec or less. Baseline: 19.53 Goal status: ongoing   5. Pt will demonstrate ability to complete 3 button/ unbutton in 1 min 40 secs or less. Baseline:  I min 47 secs Goal status: ongoing          ASSESSMENT:   CLINICAL IMPRESSION:  Pt is progressing towards ADL goals following instruction.  PERFORMANCE DEFICITS in functional skills including ADLs, IADLs, coordination, dexterity, sensation, tone, ROM, strength, flexibility, FMC, GMC, mobility, balance, decreased knowledge of precautions, decreased knowledge of use of DME, vision, and UE functional use, cognitive skills including attention, memory, problem solving, safety awareness, thought, and understand, and psychosocial skills including coping strategies, environmental adaptation, and routines and behaviors.    IMPAIRMENTS are limiting patient from ADLs, IADLs, rest and sleep, play, leisure, and social participation.    COMORBIDITIES may have co-morbidities  that affects occupational performance. Patient will benefit from skilled OT to address above impairments and improve overall function.   MODIFICATION OR ASSISTANCE TO COMPLETE EVALUATION: Min-Moderate modification of tasks or assist with assess necessary to  complete an evaluation.    OT OCCUPATIONAL PROFILE AND HISTORY: Detailed assessment: Review of records and additional review of physical, cognitive, psychosocial history related to current functional performance.   CLINICAL DECISION MAKING: Moderate - several treatment options, min-mod task modification necessary   REHAB POTENTIAL: Good   EVALUATION COMPLEXITY: Moderate       PLAN: OT FREQUENCY: 2x/week   OT DURATION: 12 weeks   PLANNED INTERVENTIONS: self care/ADL training, therapeutic exercise, therapeutic activity, neuromuscular re-education, manual therapy, passive range of motion, gait training, balance training, functional mobility training, splinting, ultrasound, paraffin, fluidotherapy, moist heat, cryotherapy, contrast bath, patient/family education, cognitive remediation/compensation, visual/perceptual remediation/compensation, energy conservation, coping strategies training, and DME and/or AE instructions   RECOMMENDED OTHER SERVICES: n/a   CONSULTED AND AGREED WITH PLAN OF CARE: Patient   PLAN FOR NEXT SESSION:  review/check on exercise flowsheet ; functional reaching, ADL strategies, start checking long term goals, schedule evals in 6 mons, plan to d/c next week.    Theone Murdoch, OTR/L [phone] Phone: [phone] 11:53 AM 01/18/22  [facility] [address] [employer] [place], [place]  [address] [phone] phone [phone] 01/18/22 11:38 AM

## 2022-01-20 ENCOUNTER — Ambulatory Visit: Payer: Medicare Other | Admitting: Physical Therapy

## 2022-01-20 ENCOUNTER — Ambulatory Visit: Payer: Medicare Other | Admitting: Occupational Therapy

## 2022-01-21 ENCOUNTER — Telehealth: Payer: Medicare Other

## 2022-01-24 ENCOUNTER — Ambulatory Visit (INDEPENDENT_AMBULATORY_CARE_PROVIDER_SITE_OTHER): Payer: Medicare Other

## 2022-01-24 DIAGNOSIS — I5022 Chronic systolic (congestive) heart failure: Secondary | ICD-10-CM

## 2022-01-24 DIAGNOSIS — Z95810 Presence of automatic (implantable) cardiac defibrillator: Secondary | ICD-10-CM | POA: Diagnosis not present

## 2022-01-25 ENCOUNTER — Ambulatory Visit: Payer: Medicare Other | Admitting: Physical Therapy

## 2022-01-25 ENCOUNTER — Ambulatory Visit: Payer: Medicare Other | Admitting: Occupational Therapy

## 2022-01-25 ENCOUNTER — Encounter: Payer: Self-pay | Admitting: Physical Therapy

## 2022-01-25 DIAGNOSIS — R208 Other disturbances of skin sensation: Secondary | ICD-10-CM | POA: Diagnosis not present

## 2022-01-25 DIAGNOSIS — R29818 Other symptoms and signs involving the nervous system: Secondary | ICD-10-CM

## 2022-01-25 DIAGNOSIS — Z9181 History of falling: Secondary | ICD-10-CM

## 2022-01-25 DIAGNOSIS — M25611 Stiffness of right shoulder, not elsewhere classified: Secondary | ICD-10-CM | POA: Diagnosis not present

## 2022-01-25 DIAGNOSIS — R2681 Unsteadiness on feet: Secondary | ICD-10-CM | POA: Diagnosis not present

## 2022-01-25 DIAGNOSIS — R2689 Other abnormalities of gait and mobility: Secondary | ICD-10-CM

## 2022-01-27 ENCOUNTER — Ambulatory Visit: Payer: Medicare Other | Admitting: Occupational Therapy

## 2022-01-27 ENCOUNTER — Ambulatory Visit: Payer: Medicare Other | Admitting: Physical Therapy

## 2022-01-27 ENCOUNTER — Telehealth: Payer: Self-pay | Admitting: Physical Therapy

## 2022-01-27 ENCOUNTER — Telehealth: Payer: Self-pay

## 2022-01-27 NOTE — Telephone Encounter (Signed)
I spoke with the patient about missed ICM transmission. I helped the patient send a manual transmission. Transmission received. 01/27/2022.

## 2022-01-27 NOTE — Telephone Encounter (Signed)
Dr. Burt Knack,  I have been working with your patient Veronica Shaw in Physical Therapy for PD. I just wanted to make you aware that she has been having more lightheadedness during gait and standing at home that has been leading to some of her falls/imbalance.  When assessing her orthostatics in the clinic, her BP values were: Supine: 106/51 with HR: 68 Sitting: 92/54 with HR: 74 Standing: 105/56 with HR 77  Just wanted to make you aware.   Thank you, [name], PT, DPT 01/27/22 10:03 AM    [facility] [address] [place], [place]  [address] Phone:  [phone] Fax:  [phone]

## 2022-01-28 ENCOUNTER — Telehealth: Payer: Medicare Other

## 2022-01-28 ENCOUNTER — Telehealth: Payer: Self-pay

## 2022-01-28 NOTE — Progress Notes (Signed)
EPIC Encounter for ICM Monitoring  Patient Name: Veronica Shaw is a 71 y.o. female Date: 01/28/2022 Primary Care Physican: Lyndee Hensen, DO Primary Cardiologist: Cooper/Weaver PA Electrophysiologist: Lovena Le 11/02/2021 Weight: 121 lbs at home 12/09/2021 Weight: 121 lbs                                     Attempted call to patient and unable to reach.  Left detailed message per DPR regarding transmission. Transmission reviewed.    OptiVol Thoracic impedance suggesting normal fluid levels.    Prescribed: Furosemide 20 mg Take 1 tablet (20 mg total) by mouth daily.  Confirmed 4/4 she is taking 20 mg daily.   Labs: 08/20/2021 Creatinine 1.11, BUN 21, Potassium 4.3, Sodium 140, GFR 53 04/20/2021 Creatinine 1.24, BUN 13, Potassium 4.6, Sodium 140, GFR 46 01/22/2021 Creatinine 1.50, BUN 29, Potassium 4.6, Sodium 144 01/18/2021 Creatinine 1.39, BUN 23, Potassium 4.6, Sodium 137, GFR 41  A complete set of results can be found in Results Review.   Recommendations:   Left voice mail with ICM number and encouraged to call if experiencing any fluid symptoms.   Follow-up plan: ICM clinic phone appointment on 02/28/2022.   91 day device clinic remote transmission 02/24/2022.     EP/Cardiology Office Visits:  06/28/2022 with Richardson Dopp, PA.   Recalls 01/17/2022 for [name].  12/22/2021 with Dr Burt Knack.   Copy of ICM check sent to Dr. Lovena Le.    3 month ICM trend: 01/27/2022.    12-14 Month ICM trend:     [name], RN 01/28/2022 4:18 PM

## 2022-01-28 NOTE — Telephone Encounter (Signed)
Remote ICM transmission received.  Attempted call to patient regarding ICM remote transmission and left detailed message per DPR.  Advised to return call for any fluid symptoms or questions. Next ICM remote transmission scheduled 02/28/2022.

## 2022-02-01 NOTE — Telephone Encounter (Signed)
Thx [name].   Veronica Shaw - can you call her and ask her to stop taking losartan? Also advise her to increase water intake if she's not drinking enough fluids. thanks

## 2022-02-02 ENCOUNTER — Ambulatory Visit: Payer: Medicare Other

## 2022-02-02 NOTE — Chronic Care Management (AMB) (Signed)
Chronic Care Management   CCM RN Visit Note  02/02/2022 Name: Veronica Shaw MRN: [id number] DOB: 08/20/1950  Subjective: Veronica Shaw is a 71 y.o. year old female who is a primary care patient of Veronica Heck, MD. The care management team was consulted for assistance with disease management and care coordination needs.    Engaged with patient by telephone for follow up visit in response to provider referral for case management and/or care coordination services.   Consent to Services:  The patient was given information about Chronic Care Management services, agreed to services, and gave verbal consent prior to initiation of services.  Please see initial visit note for detailed documentation.   Patient agreed to services and verbal consent obtained.    Summary:  The patient continues to maintain positive progress with care plan goals.  However, she had shortness of breath last week but managed it using her inhaler. She is following her medication regimen as prescribed. I recommended that she avoid being in the heat as much as possible, stay hydrated, and attend all her appointments.See Care Plan below for interventions and patient self-care actives.  Recommendation: The patient may benefit from taking medications as prescribed, weighing Daily and record values, and The patient agrees.  Follow up Plan: Patient would like continued follow-up.  CM RNCM will outreach the patient within the next 8 weeks  Patient will call office if needed prior to next encounter   Assessment: Review of patient past medical history, allergies, medications, health status, including review of consultants reports, laboratory and other test data, was performed as part of comprehensive evaluation and provision of chronic care management services.   SDOH (Social Determinants of Health) assessments and interventions performed:  No  CCM Care Plan     Conditions to be addressed/monitored:CHF  Care Plan  : RN Case Manager  Updates made by Veronica Arms, RN since 02/02/2022 12:00 AM     Problem: Symptom Exacerbation (Heart Failure)      Long-Range Goal: Patient to manage and monitor signs and symptoms of Heart Failure   Start Date: 11/03/2020  Expected End Date: 08/31/2021  Priority: High  Note:   Current Barriers:  Knowledge deficit related to basic heart failure pathophysiology and self care management  RNCM Clinical Goal(s):  Patient will verbalize understanding of plan for management of CHF as evidenced by by 90 days of no admissions to the hospital  through collaboration with RN Care manager, provider, and care team.   Interventions: 1:1 collaboration with primary care provider regarding development and update of comprehensive plan of care as evidenced by provider attestation and co-signature Inter-disciplinary care team collaboration (see longitudinal plan of care) Evaluation of current treatment plan related to  self management and patient's adherence to plan as established by provider   Heart Failure Interventions:  (Status:  Condition stable.  Not addressed this visit.) Long Term Goal  Interventions: Goal on Track Basic overview and discussion of  Heart Failure reviewed Reviewed Heart Failure Action Plan  Discussed importance of daily weight and advised patient to weigh and record daily- 02/02/22: I spoke with Veronica Shaw today, who informed me she is doing alright. She has maintained her weight and has not experienced any chest pain or swelling. However, she had shortness of breath last week but managed it using her inhaler. She is following her medication regimen as prescribed. I recommended that she avoid being in the heat as much as possible, stay hydrated, and attend all her appointments.  Patient Goals/Self Care Activities: -Patient/Caregiver will self-administer medications as prescribed as evidenced by self-report/primary caregiver report  -Patient/Caregiver will attend all  scheduled provider appointments as evidenced by clinician review of documented attendance to scheduled appointments and patient/caregiver report -Patient/Caregiver will call pharmacy for medication refills as evidenced by patient report and review of pharmacy fill history as appropriate -Patient/Caregiver will call provider office for new concerns or questions as evidenced by review of documented incoming telephone call notes and patient report -Patient/Caregiver verbalizes understanding of plan -Patient/Caregiver will focus on medication adherence by taking medications as prescribed -Weigh daily and record (notify MD with 3 lb weight gain over night or 5 lb in a week) -Follow CHF Action Plan -Adhere to low sodium diet     Veronica Arms RN, BSN, [place] Management Coordinator [facility]  Phone: [phone]

## 2022-02-02 NOTE — Telephone Encounter (Signed)
Called and spoke to patient. She verbalized understanding of which drug to stop (Losartan) and will attempt to increase her fluids. Advised her to keep an eye on her BP and call us if she needs [place]. MAR updated.

## 2022-02-02 NOTE — Patient Instructions (Signed)
Visit Information  [name]  it was nice speaking with you. Please call me directly [phone] if you have questions about the goals we discussed.    Patient Goals/Self Care Activities: -Patient/Caregiver will self-administer medications as prescribed as evidenced by self-report/primary caregiver report  -Patient/Caregiver will attend all scheduled provider appointments as evidenced by clinician review of documented attendance to scheduled appointments and patient/caregiver report -Patient/Caregiver will call pharmacy for medication refills as evidenced by patient report and review of pharmacy fill history as appropriate -Patient/Caregiver will call provider office for new concerns or questions as evidenced by review of documented incoming telephone call notes and patient report -Patient/Caregiver verbalizes understanding of plan -Patient/Caregiver will focus on medication adherence by taking medications as prescribed -Weigh daily and record (notify MD with 3 lb weight gain over night or 5 lb in a week) -Follow CHF Action Plan -Adhere to low sodium diet     Patient verbalizes understanding of instructions and care plan provided today and agrees to view in [place]. Active MyChart status and patient understanding of how to access instructions and care plan via MyChart confirmed with patient.     Follow up Plan: Patient would like continued follow-up.  CM RNCM will outreach the patient within the next 8 weeks.  Patient will call office if needed prior to next encounter  Lazaro Arms, RN  [phone]

## 2022-02-03 ENCOUNTER — Encounter: Payer: Medicare Other | Admitting: Occupational Therapy

## 2022-02-07 ENCOUNTER — Ambulatory Visit: Payer: Medicare Other | Admitting: Physician Assistant

## 2022-02-07 DIAGNOSIS — E113291 Type 2 diabetes mellitus with mild nonproliferative diabetic retinopathy without macular edema, right eye: Secondary | ICD-10-CM | POA: Diagnosis not present

## 2022-02-07 DIAGNOSIS — Z961 Presence of intraocular lens: Secondary | ICD-10-CM | POA: Diagnosis not present

## 2022-02-07 LAB — HM DIABETES EYE EXAM

## 2022-02-10 ENCOUNTER — Encounter: Payer: Self-pay | Admitting: Physical Therapy

## 2022-02-10 NOTE — Therapy (Signed)
[facility] [address] [place], [place], [address] Phone: [phone]   Fax:  [phone]  Patient Details  Name: Veronica Shaw MRN: [id number] Date of Birth: 06-Mar-1951 Referring Provider:  No ref. provider found  Encounter Date: 02/10/2022  PHYSICAL THERAPY DISCHARGE SUMMARY  Visits from Start of Care: 14  Current functional level related to goals / functional outcomes: Unable to assess LTGs as pt did not come to last appt.    Remaining deficits: Bradykinesia, balance impairments, postural abnormalities, gait impairments, decr strength, dizziness, impaired safety awareness.    Education / Equipment: HEP, fall prevention, community resources.    Pt cancelled last visit for D/C. Attempted to re-schedule for PT/OT but pt did not want to come in and states that her husband can't bring her and would like to be discharged from therapies. When asked to schedule a PD screen/eval in 6 months, pt reports that she did not want to schedule that and she and her husband would not be able to come out in the winter.   Patient agrees to discharge. Patient goals were not met. Patient is being discharged due to the patient's request.  Arliss Journey, PT, DPT  02/10/2022, 1:52 PM  [facility] [address] [place], [place], [address] Phone: [phone]   Fax:  [phone]

## 2022-03-01 ENCOUNTER — Other Ambulatory Visit: Payer: Self-pay | Admitting: Family Medicine

## 2022-03-01 DIAGNOSIS — Z1231 Encounter for screening mammogram for malignant neoplasm of breast: Secondary | ICD-10-CM

## 2022-03-02 NOTE — Progress Notes (Signed)
No ICM remote transmission received for 02/28/2022 and next ICM transmission scheduled for 03/16/2022.

## 2022-03-10 DIAGNOSIS — N1831 Chronic kidney disease, stage 3a: Secondary | ICD-10-CM | POA: Diagnosis not present

## 2022-03-10 DIAGNOSIS — N189 Chronic kidney disease, unspecified: Secondary | ICD-10-CM | POA: Diagnosis not present

## 2022-03-17 NOTE — Progress Notes (Signed)
No ICM remote transmission received for 03/16/2022 and next ICM transmission scheduled for 03/30/2022.

## 2022-03-18 ENCOUNTER — Ambulatory Visit: Payer: Medicare Other

## 2022-03-18 DIAGNOSIS — G20 Parkinson's disease: Secondary | ICD-10-CM | POA: Diagnosis not present

## 2022-03-18 DIAGNOSIS — N1831 Chronic kidney disease, stage 3a: Secondary | ICD-10-CM | POA: Diagnosis not present

## 2022-03-18 DIAGNOSIS — E1122 Type 2 diabetes mellitus with diabetic chronic kidney disease: Secondary | ICD-10-CM | POA: Diagnosis not present

## 2022-03-18 DIAGNOSIS — D631 Anemia in chronic kidney disease: Secondary | ICD-10-CM | POA: Diagnosis not present

## 2022-03-18 DIAGNOSIS — I129 Hypertensive chronic kidney disease with stage 1 through stage 4 chronic kidney disease, or unspecified chronic kidney disease: Secondary | ICD-10-CM | POA: Diagnosis not present

## 2022-03-18 DIAGNOSIS — E875 Hyperkalemia: Secondary | ICD-10-CM | POA: Diagnosis not present

## 2022-03-18 DIAGNOSIS — E1140 Type 2 diabetes mellitus with diabetic neuropathy, unspecified: Secondary | ICD-10-CM | POA: Diagnosis not present

## 2022-03-19 ENCOUNTER — Other Ambulatory Visit: Payer: Self-pay

## 2022-03-19 ENCOUNTER — Ambulatory Visit (HOSPITAL_COMMUNITY)
Admission: RE | Admit: 2022-03-19 | Discharge: 2022-03-19 | Disposition: A | Discharge status: Other Acute Inpatient Hospital | Payer: Medicare Other | Source: Ambulatory Visit | Attending: Student | Admitting: Student

## 2022-03-19 ENCOUNTER — Emergency Department (HOSPITAL_BASED_OUTPATIENT_CLINIC_OR_DEPARTMENT_OTHER): Payer: Medicare Other

## 2022-03-19 ENCOUNTER — Emergency Department (HOSPITAL_BASED_OUTPATIENT_CLINIC_OR_DEPARTMENT_OTHER)
Admission: EM | Admit: 2022-03-19 | Discharge: 2022-03-19 | Disposition: A | Discharge status: Home / Self Care | Payer: Medicare Other | Attending: Emergency Medicine | Admitting: Emergency Medicine

## 2022-03-19 DIAGNOSIS — H6692 Otitis media, unspecified, left ear: Secondary | ICD-10-CM | POA: Diagnosis not present

## 2022-03-19 DIAGNOSIS — W19XXXA Unspecified fall, initial encounter: Secondary | ICD-10-CM | POA: Insufficient documentation

## 2022-03-19 DIAGNOSIS — H6690 Otitis media, unspecified, unspecified ear: Secondary | ICD-10-CM

## 2022-03-19 DIAGNOSIS — H9202 Otalgia, left ear: Secondary | ICD-10-CM | POA: Diagnosis present

## 2022-03-19 DIAGNOSIS — R42 Dizziness and giddiness: Secondary | ICD-10-CM | POA: Insufficient documentation

## 2022-03-19 LAB — URINALYSIS, ROUTINE W REFLEX MICROSCOPIC
Bilirubin Urine: NEGATIVE
Glucose, UA: NEGATIVE mg/dL
Hgb urine dipstick: NEGATIVE
Ketones, ur: NEGATIVE mg/dL
Nitrite: NEGATIVE
Protein, ur: NEGATIVE mg/dL
Specific Gravity, Urine: 1.012 (ref 1.005–1.030)
pH: 7 (ref 5.0–8.0)

## 2022-03-19 LAB — BASIC METABOLIC PANEL
Anion gap: 8 (ref 5–15)
BUN: 29 mg/dL — ABNORMAL HIGH (ref 8–23)
CO2: 27 mmol/L (ref 22–32)
Calcium: 9.7 mg/dL (ref 8.9–10.3)
Chloride: 103 mmol/L (ref 98–111)
Creatinine, Ser: 1.24 mg/dL — ABNORMAL HIGH (ref 0.44–1.00)
GFR, Estimated: 46 mL/min — ABNORMAL LOW (ref >60)
Glucose, Bld: 98 mg/dL (ref 70–99)
Potassium: 4.5 mmol/L (ref 3.5–5.1)
Sodium: 138 mmol/L (ref 135–145)

## 2022-03-19 LAB — CBC
HCT: 37.2 % (ref 36.0–46.0)
Hemoglobin: 12.6 g/dL (ref 12.0–15.0)
MCH: 30.3 pg (ref 26.0–34.0)
MCHC: 33.9 g/dL (ref 30.0–36.0)
MCV: 89.4 fL (ref 80.0–100.0)
Platelets: 207 10*3/uL (ref 150–400)
RBC: 4.16 MIL/uL (ref 3.87–5.11)
RDW: 11.9 % (ref 11.5–15.5)
WBC: 8.4 10*3/uL (ref 4.0–10.5)
nRBC: 0 % (ref 0.0–0.2)

## 2022-03-19 MED ORDER — AZITHROMYCIN 250 MG PO TABS: 250.0000 mg | ORAL_TABLET | Freq: Every day | ORAL | 0 refills | Status: DC

## 2022-03-19 MED ORDER — MECLIZINE HCL 25 MG PO TABS: 25.0000 mg | ORAL_TABLET | Freq: Three times a day (TID) | ORAL | 0 refills | Status: AC | PRN

## 2022-03-19 NOTE — ED Triage Notes (Addendum)
Patient arrives ambulatory to triage with complaints of worsening balance issues x1 month, left ear pain, and dizziness. Patient also reports falling a few times a week.  Patient reports pain a 6/10.  Hx of Vertigo and Parkinsons.

## 2022-03-19 NOTE — ED Notes (Signed)
Dc instructions and scripts reviewed with pt no questions or concerns at this time. Will follow up with ENT

## 2022-03-19 NOTE — ED Triage Notes (Signed)
Pt presents to the office for loss of balance x 1 month.

## 2022-03-19 NOTE — Discharge Instructions (Addendum)
Please start the antibiotics and call the ENT doctors on Monday for follow-up in 7 to 14 days.

## 2022-03-19 NOTE — ED Provider Notes (Signed)
Veronica Shaw Note   CSN: [id number] Arrival date & time: 03/19/22  1201     History {Add pertinent medical, surgical, social history, OB history to HPI:1} Chief Complaint  Patient presents with   Fall   Otalgia    Left   Dizziness    Veronica Shaw is a 71 y.o. female.  HPI     71 year old female with chief complaint of fall  Home Medications Prior to Admission medications   Medication Sig Start Date End Date Taking? Authorizing Shaw  azithromycin (ZITHROMAX) 250 MG tablet Take 1 tablet (250 mg total) by mouth daily. Take first 2 tablets together, then 1 every day until finished. 03/19/22  Yes Varney Biles, MD  meclizine (ANTIVERT) 25 MG tablet Take 1 tablet (25 mg total) by mouth 3 (three) times daily as needed for dizziness. 03/19/22  Yes Varney Biles, MD  acetaminophen (TYLENOL) 500 MG tablet Take 2 tablets (1,000 mg total) by mouth every 8 (eight) hours as needed. 01/29/21   Brimage, Ronnette Juniper, DO  APPLE CIDER VINEGAR PO Take 1 capsule by mouth daily.    Shaw, Historical, MD  Biotin 5000 MCG CAPS Take 1 tablet by mouth daily.     Shaw, Historical, MD  budesonide-formoterol (SYMBICORT) 160-4.5 MCG/ACT inhaler Inhale 2 puffs into the lungs 2 (two) times daily. 10/24/19   Lauraine Rinne, NP  carbidopa-levodopa (SINEMET CR) 50-200 MG tablet Take 1 tablet by mouth at bedtime. 10/20/21   Tat, Eustace Quail, DO  carbidopa-levodopa (SINEMET IR) 25-100 MG tablet Take 1 tablet by mouth 3 (three) times daily. 10/19/21   Tat, Eustace Quail, DO  diclofenac Sodium (VOLTAREN) 1 % GEL GIVE 4  TOPICALLY 4 TIMES DAILY 10/05/21   Lyndee Hensen, DO  estradiol (ESTRACE) 0.1 MG/GM vaginal cream every other day. 10/27/21   Shaw, Historical, MD  famotidine (PEPCID) 20 MG tablet TAKE 1 TABLET TWICE DAILY 06/17/19   Caroline More, DO  fluticasone (FLONASE SENSIMIST) 27.5 MCG/SPRAY nasal spray Place 2 sprays into the nose daily. 12/17/20   Parrett, Fonnie Mu, NP  furosemide (LASIX) 20 MG tablet Take 20 mg by mouth daily.    Shaw, Historical, MD  gabapentin (NEURONTIN) 100 MG capsule Take 100 mg by mouth daily. 09/03/21   Shaw, Historical, MD  glipiZIDE (GLUCOTROL) 5 MG tablet Take 0.5 tablets (2.5 mg total) by mouth daily before breakfast. 01/06/22   Shamleffer, Melanie Crazier, MD  levalbuterol (XOPENEX) 0.63 MG/3ML nebulizer solution USE 1 VIAL IN NEBULIZER EVERY 4 TO 6 HOURS AS NEEDED FOR WHEEZING AND FOR SHORTNESS OF BREATH 10/14/20   Mannam, Praveen, MD  melatonin 5 MG TABS Take 5 mg by mouth at bedtime.    Shaw, Historical, MD  metFORMIN (GLUCOPHAGE) 500 MG tablet Take 1 tablet (500 mg total) by mouth 2 (two) times daily with a meal. 09/06/21   Shamleffer, Melanie Crazier, MD  metoprolol succinate (TOPROL-XL) 50 MG 24 hr tablet Take 1 tablet (50 mg total) by mouth 2 (two) times daily. 07/21/21   [name], MD  mirtazapine (REMERON) 15 MG tablet Take 1 tablet (15 mg total) by mouth at bedtime. 10/20/21   Tat, Eustace Quail, DO  montelukast (SINGULAIR) 10 MG tablet Take 1 tablet (10 mg total) by mouth at bedtime. 06/01/21   Marshell Garfinkel, MD  OXYGEN Inhale into the lungs at bedtime. Inhale 2L into lungs    Shaw, Historical, MD  potassium chloride (KLOR-CON) 10 MEQ tablet Take 1 tablet (10 mEq total) by  mouth daily. 11/04/21   Sherren Mocha, MD  rosuvastatin (CRESTOR) 10 MG tablet Take 1 tablet (10 mg total) by mouth as directed. Three times a week 09/06/21   Shamleffer, Melanie Crazier, MD  spironolactone (ALDACTONE) 25 MG tablet TAKE 1/2 TABLET EVERY DAY 05/05/21   Sherren Mocha, MD  tiZANidine (ZANAFLEX) 2 MG tablet TAKE 1 TABLET BY MOUTH EVERY 8 HOURS AS NEEDED FOR MUSCLE SPASM 03/25/21   Brimage, Ronnette Juniper, DO  Ubiquinone (ULTRA COQ10 PO) Take 1 tablet by mouth 3 (three) times a week.    Shaw, Historical, MD      Allergies    Aspirin, Penicillins, Prempro [conj estrog-medroxyprogest ace], Ace inhibitors, and Simvastatin    Review  of Systems   Review of Systems  Physical Exam Updated Vital Signs BP (!) 135/54   Pulse 66   Temp 98.5 F (36.9 C)   Resp 15   Ht '5\' 1"'$  (1.549 m)   Wt 54.4 kg   LMP 08/01/2000 (Approximate)   SpO2 96%   BMI 22.67 kg/m  Physical Exam  ED Results / Procedures / Treatments   Labs (all labs ordered are listed, but only abnormal results are displayed) Labs Reviewed  BASIC METABOLIC PANEL - Abnormal; Notable for the following components:      Result Value   BUN 29 (*)    Creatinine, Ser 1.24 (*)    GFR, Estimated 46 (*)    All other components within normal limits  URINALYSIS, ROUTINE W REFLEX MICROSCOPIC - Abnormal; Notable for the following components:   Leukocytes,Ua MODERATE (*)    Bacteria, UA RARE (*)    All other components within normal limits  CBC    EKG EKG Interpretation  Date/Time:  Saturday March 19 2022 12:11:52 EDT Ventricular Rate:  78 PR Interval:  164 QRS Duration: 126 QT Interval:  414 QTC Calculation: 471 R Axis:   -18 Text Interpretation: Normal sinus rhythm Non-specific intra-ventricular conduction block Cannot rule out Anteroseptal infarct , age undetermined Abnormal ECG When compared with ECG of 01-Jan-2021 16:02, Minimal criteria for Anteroseptal infarct are now Present Nonspecific T wave abnormality no longer evident in Inferior leads T wave inversion no longer evident in Lateral leads Confirmed by Varney Biles [phone]) on 03/19/2022 2:17:13 PM  Radiology CT Head Wo Contrast  Result Date: 03/19/2022 CLINICAL DATA:  Trauma, dizziness, repeated falls EXAM: CT HEAD WITHOUT CONTRAST TECHNIQUE: Contiguous axial images were obtained from the base of the skull through the vertex without intravenous contrast. RADIATION DOSE REDUCTION: This exam was performed according to the departmental dose-optimization program which includes automated exposure control, adjustment of the mA and/or kV according to patient size and/or use of iterative reconstruction  technique. COMPARISON:  10/17/2017 FINDINGS: Brain: No acute intracranial findings are seen. There are no signs of bleeding within the cranium. Ventricles are not dilated. Cortical sulci are prominent. Vascular: Unremarkable. Skull: Unremarkable. Sinuses/Orbits: Unremarkable. Other: None. IMPRESSION: No acute intracranial findings are seen in noncontrast CT brain. Atrophy. Electronically Signed   By: Elmer Picker M.D.   On: 03/19/2022 15:11    Procedures Procedures  {Document cardiac monitor, telemetry assessment procedure when appropriate:1}  Medications Ordered in ED Medications - No data to display  ED Course/ Medical Decision Making/ A&P                           Medical Decision Making Amount and/or Complexity of Data Reviewed Labs: ordered. Radiology: ordered.  Risk Prescription drug management.   ***  {  Document critical care time when appropriate:1} {Document review of labs and clinical decision tools ie heart score, Chads2Vasc2 etc:1}  {Document your independent review of radiology images, and any outside records:1} {Document your discussion with family members, caretakers, and with consultants:1} {Document social determinants of health affecting pt's care:1} {Document your decision making why or why not admission, treatments were needed:1} Final Clinical Impression(s) / ED Diagnoses Final diagnoses:  Chronic otitis media, unspecified otitis media type    Rx / DC Orders ED Discharge Orders          Ordered    azithromycin (ZITHROMAX) 250 MG tablet  Daily        03/19/22 1526    meclizine (ANTIVERT) 25 MG tablet  3 times daily PRN        03/19/22 1526

## 2022-03-23 ENCOUNTER — Ambulatory Visit: Payer: Medicare Other

## 2022-03-25 ENCOUNTER — Ambulatory Visit: Payer: Medicare Other

## 2022-03-26 NOTE — Patient Instructions (Signed)
Visit Information  [name]  it was nice speaking with you. Please call me directly [phone] if you have questions about the goals we discussed.    Patient Goals/Self Care Activities: -Patient/Caregiver will self-administer medications as prescribed as evidenced by self-report/primary caregiver report  -Patient/Caregiver will attend all scheduled provider appointments as evidenced by clinician review of documented attendance to scheduled appointments and patient/caregiver report -Patient/Caregiver will call pharmacy for medication refills as evidenced by patient report and review of pharmacy fill history as appropriate -Patient/Caregiver will call provider office for new concerns or questions as evidenced by review of documented incoming telephone call notes and patient report -Patient/Caregiver verbalizes understanding of plan -Patient/Caregiver will focus on medication adherence by taking medications as prescribed -Weigh daily and record (notify MD with 3 lb weight gain over night or 5 lb in a week) -Follow CHF Action Plan -Adhere to low sodium diet   Patient verbalizes understanding of instructions and care plan provided today and agrees to view in [place]. Active MyChart status and patient understanding of how to access instructions and care plan via MyChart confirmed with patient.     Follow up Plan: Patient would like continued follow-up.  CM RNCM will outreach the patient within the next 4 weeks.  Patient will call office if needed prior to next encounter  Lazaro Arms, RN  [phone]

## 2022-03-26 NOTE — Chronic Care Management (AMB) (Signed)
RNCM Care Management Note 03/25/2022 Name: Veronica Shaw MRN: [id number] DOB: 08-07-1950   Veronica Shaw is a 71 y.o. year old female who sees Gerrit Heck, MD for primary care. RNCM was consulted  to assistance patient with  Care Coordination.      Engaged with patient by telephone for follow up visit in response to provider referral for Care Coordination.     Summary: Patient is currently experiencing symptoms of  dizziness which seems to be exacerbated by ear infection... See Care Plan below for interventions and patient self-care actives.  Recommendation: The patient may benefit from taking medications as prescribed, watch fluid intake, weighing Daily and record values, calling your physician if numbers are abnormal, and The patient agrees.  Follow up Plan: Patient would like continued follow-up.  CM RNCM will outreach the patient within the next 4 weeks  Patient will call office if needed prior to next encounter   SDOH (Social Determinants of Health) screening and interventions performed today:      RN Plan of Care for Conditions/Un Met Needs Addressed and Monitored  Patient Care Plan: RN Case Manager     Problem Identified: Symptom Exacerbation (Heart Failure) Resolved 03/26/2022     Long-Range Goal: Patient to manage and monitor signs and symptoms of Heart Failure Completed 03/26/2022  Start Date: 11/03/2020  Expected End Date: 08/31/2021  Priority: High  Note:   Current Barriers:  Knowledge deficit related to basic heart failure pathophysiology and self care management  RNCM Clinical Goal(s):  Patient will verbalize understanding of plan for management of CHF as evidenced by by 90 days of no admissions to the hospital  through collaboration with RN Care manager, provider, and care team.   Interventions: 1:1 collaboration with primary care provider regarding development and update of comprehensive plan of care as evidenced by provider attestation and  co-signature Inter-disciplinary care team collaboration (see longitudinal plan of care) Evaluation of current treatment plan related to  self management and patient's adherence to plan as established by provider   Heart Failure Interventions:  (Status:  Condition stable.  Not addressed this visit.) Long Term Goal  Interventions: Goal on Track Basic overview and discussion of  Heart Failure reviewed Reviewed Heart Failure Action Plan  Discussed importance of daily weight and advised patient to weigh and record daily- 03/25/22: I spoke with Veronica Shaw, and she reported feeling better since being seen in the ED on August 18th, 2023, for dizziness. She fell, but there was no injury. She was diagnosed with an ear infection and prescribed an antibiotic and meclizine for the dizziness. She has appointments with ENT in the next few weeks and a hospital follow-up on August 29th, 2023, at 11:25 a.m. We reviewed the fall protocol and advised her to attend appointments and take her medications. She denies any problems with chest pain, swelling, or shortness of breath, nothing beyond normal.   Patient Goals/Self Care Activities: -Patient/Caregiver will self-administer medications as prescribed as evidenced by self-report/primary caregiver report  -Patient/Caregiver will attend all scheduled provider appointments as evidenced by clinician review of documented attendance to scheduled appointments and patient/caregiver report -Patient/Caregiver will call pharmacy for medication refills as evidenced by patient report and review of pharmacy fill history as appropriate -Patient/Caregiver will call provider office for new concerns or questions as evidenced by review of documented incoming telephone call notes and patient report -Patient/Caregiver verbalizes understanding of plan -Patient/Caregiver will focus on medication adherence by taking medications as prescribed -Weigh daily and record (  notify MD with 3 lb  weight gain over night or 5 lb in a week) -Follow CHF Action Plan -Adhere to low sodium diet  Resolving due to duplicate goal           Lazaro Arms RN, BSN, [place] Management Coordinator [facility]   Phone: [phone]

## 2022-03-28 ENCOUNTER — Other Ambulatory Visit: Payer: Self-pay | Admitting: *Deleted

## 2022-03-28 MED ORDER — FUROSEMIDE 20 MG PO TABS: 20.0000 mg | ORAL_TABLET | Freq: Every day | ORAL | 1 refills | Status: DC

## 2022-03-29 ENCOUNTER — Telehealth: Payer: Self-pay | Admitting: Student

## 2022-03-29 ENCOUNTER — Ambulatory Visit (INDEPENDENT_AMBULATORY_CARE_PROVIDER_SITE_OTHER): Payer: Medicare Other | Admitting: Student

## 2022-03-29 ENCOUNTER — Encounter: Payer: Self-pay | Admitting: Student

## 2022-03-29 VITALS — BP 112/47 | HR 74 | Ht 61.0 in | Wt 120.8 lb

## 2022-03-29 DIAGNOSIS — J310 Chronic rhinitis: Secondary | ICD-10-CM | POA: Diagnosis not present

## 2022-03-29 DIAGNOSIS — G5603 Carpal tunnel syndrome, bilateral upper limbs: Secondary | ICD-10-CM

## 2022-03-29 DIAGNOSIS — R42 Dizziness and giddiness: Secondary | ICD-10-CM | POA: Diagnosis not present

## 2022-03-29 MED ORDER — FLUTICASONE PROPIONATE 50 MCG/ACT NA SUSP: 2.0000 | Freq: Every day | NASAL | 6 refills | Status: DC

## 2022-03-29 NOTE — Telephone Encounter (Signed)
Patient dropped off a prescription order form to be completed. Last DOS was 03/29/22. Placed in [employer].

## 2022-03-29 NOTE — Patient Instructions (Addendum)
It was great seeing you today.  Start using Flonase with one spray per nose each day.  Also start wearing a "cock-up wrist splint" on each wrist every night to help with what I believe is carpal tunnel syndrome.  I sent in a referral to the neuro rehab physical therapists to help with vertigo. They will call you to schedule.  We will plan to see you again next year unless you have any other concerns.   If you have any questions or concerns, please feel free to call the clinic.    Be well,  Dr. Orvis Brill [facility] [phone]

## 2022-03-29 NOTE — Progress Notes (Signed)
    SUBJECTIVE:   CHIEF COMPLAINT / HPI:   Dizziness/vertigo and ED follow-up Went to ED for fall d/t dizziness. She also notes she has had Parkinson's Disease for 13 years and struggles with tremors/gait which contributed to fall. 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 Denies any ear pain/fullness. Also sent home with Meclizine, which she says only helps a little bit Supposed to have yearly follow-up with Dr. Barry Dienes (ENT) Feels that her vertigo is continuing- when she looks in the mirror it feels like everything is spinning. She has had vertigo in the past, and this feels like it.  Bilateral wrist and hand pain/numbness Ongoing for quite a while in both hands on palmar surface and sometimes wrist/forearm No neck pain The numbness and pain awakens her at night and she has to flick her hands Says that the symptoms are the most bothersome to her today  PERTINENT  PMH / PSH: Reviewed  OBJECTIVE:   BP (!) 112/47   Pulse 74   Ht '5\' 1"'$  (1.549 m)   Wt 120 lb 12.8 oz (54.8 kg)   LMP 08/01/2000 (Approximate)   SpO2 96%   BMI 22.82 kg/m   General: Pleasant, well-appearing, ambulates with rolling walker HEENT: TM's  CV: RRR Resp: Normal WOB on room air Wrist/Hand: No tenderness to palpation. Bilateral wrists and hands without swelling/bruising. Normal ROM. Strength decreased bilaterally with hand grip. Normal sensation bilaterally. + Phalens/Tinnels tests bilaterally. Neuro: Alert and oriented. Speech clear and fluent. Masked facies secondary to Parkinson's   ASSESSMENT/PLAN:   Chronic rhinitis Flonase sent to pharmacy  Vertigo Spinning room sensations consistent with prior history of vertigo Encouraged to continue Flonase and Meclazine Patient agreeable to neuro vestibular rehab, referral placed today Has upcoming appointment with ENT as well  Carpal tunnel syndrome on both sides Symptoms most consistent  with bilateral carpal tunnel given distribution of numbness/tingling/pain and positive Phalens/Tinnels as well as Flick sign at night Will trial nighttime wrist splinting with volar cockup brace x4 weeks. Home DME order placed. If no improvement, can pursue injections at follow-up     Orvis Brill, [place]

## 2022-03-29 NOTE — Telephone Encounter (Signed)
Clinical info completed on Prescription  form.  Placed form in PCP's box for completion.    When form is completed, please route note to "RN Team" and place in wall pocket in front office.   Salvatore Marvel, CMA

## 2022-03-30 ENCOUNTER — Ambulatory Visit (INDEPENDENT_AMBULATORY_CARE_PROVIDER_SITE_OTHER): Payer: Medicare Other

## 2022-03-30 DIAGNOSIS — I5022 Chronic systolic (congestive) heart failure: Secondary | ICD-10-CM

## 2022-03-30 DIAGNOSIS — G5603 Carpal tunnel syndrome, bilateral upper limbs: Secondary | ICD-10-CM | POA: Insufficient documentation

## 2022-03-30 DIAGNOSIS — Z95810 Presence of automatic (implantable) cardiac defibrillator: Secondary | ICD-10-CM

## 2022-03-30 NOTE — Assessment & Plan Note (Signed)
Flonase sent to pharmacy

## 2022-03-30 NOTE — Assessment & Plan Note (Signed)
Spinning room sensations consistent with prior history of vertigo Encouraged to continue Flonase and Meclazine Patient agreeable to neuro vestibular rehab, referral placed today Has upcoming appointment with ENT as well

## 2022-03-30 NOTE — Assessment & Plan Note (Addendum)
Symptoms most consistent with bilateral carpal tunnel given distribution of numbness/tingling/pain and positive Phalens/Tinnels as well as Flick sign at night Will trial nighttime wrist splinting with volar cockup brace x4 weeks. Home DME order placed. If no improvement, can pursue injections at follow-up

## 2022-03-31 NOTE — Progress Notes (Signed)
EPIC Encounter for ICM Monitoring  Patient Name: Veronica Shaw is a 71 y.o. female Date: 03/31/2022 Primary Care Physican: Gerrit Heck, MD Primary Cardiologist: Cooper/Weaver PA Electrophysiologist: Lovena Le 11/02/2021 Weight: 121 lbs at home 12/09/2021 Weight: 121 lbs                                     Spoke with patient and heart failure questions reviewed.  Pt reports she has been having dizziness, vertigo and falling (may be related to Parkinson's).  She has a follow up with ENT in October.   OptiVol Thoracic impedance normal but was suggesting possible fluid accumulation from 8/18-8/26.   Prescribed:  Furosemide 20 mg Take 1 tablet (20 mg total) by mouth daily.   Potassium 10 mEq take 1 tablet (10 mEq total) by mouth daily Spironolactone 25 mg take 0.5 tablet 912.5 mg total) by mouth daily   Labs: 03/19/2022 Creatinine 1.24, BUN 29, Potassium 4.5, Sodium 138 01/06/2022 Creatinine 1.25, BUN 24, Potassium 4.3, Sodium 140 08/20/2021 Creatinine 1.11, BUN 21, Potassium 4.3, Sodium 140, GFR 53 A complete set of results can be found in Results Review.   Recommendations:   No changes and encouraged to call if experiencing any fluid symptoms.   Follow-up plan: ICM clinic phone appointment on 05/02/2022.   91 day device clinic remote transmission 05/26/2022.     EP/Cardiology Office Visits:  06/28/2022 with Richardson Dopp, PA.   Recalls 01/17/2022 for [name].  12/22/2021 with Dr Burt Knack.   Copy of ICM check sent to Dr. Lovena Le.   3 month ICM trend: 03/30/2022.    12-14 Month ICM trend:     [name], RN 03/31/2022 1:19 PM

## 2022-03-31 NOTE — Telephone Encounter (Signed)
Placed up front for pick up.   Copy made for batch scanning.   Patient aware.

## 2022-04-07 ENCOUNTER — Telehealth: Payer: Medicare Other

## 2022-04-07 ENCOUNTER — Other Ambulatory Visit: Payer: Self-pay | Admitting: Family Medicine

## 2022-04-07 DIAGNOSIS — G8929 Other chronic pain: Secondary | ICD-10-CM

## 2022-04-12 ENCOUNTER — Telehealth: Payer: Self-pay | Admitting: Pharmacist

## 2022-04-12 NOTE — Progress Notes (Addendum)
[facility]) Care Management  [place]   04/12/2022  Byanka Landrus 03/22/1951 [id number]  Reason for referral: medication assistance renewal for 2024  Referral source: Patient Referral medication(s): Symbicort inhaler Current insurance:Medicare A &B ([employer])    Medication Assistance Findings:  Medication assistance needs identified: Symbicort inhaler. Unfortunately, Symbicort is no longer available through medication assistance programming.  Memory Dance is available through [place] program but it requires patients to spend at least $600 in out of pocket medication expenses each year for approval.  Although it is a therapy esclation vs comprable therapy, Breztri (budesonide, glycopyrrolate, and formoterol) is available through [employer].       If deemed therapeutically appropriate, patient could be switched to [place].  A note will be sent to the patient's provider for the final decision.   Additional medication assistance options reviewed with patient as warranted:  No other options identified  Plan: I will route note to patient's provider for a decision on Breztri.  ADDENDUM-Resent note to provider in reference to [facility].  ADDENDUM-Resent note to Provider in reference to [facility].  Elayne Guerin, PharmD, [place] Clinical Pharmacist [phone]

## 2022-04-14 ENCOUNTER — Ambulatory Visit: Payer: Medicare Other

## 2022-04-22 ENCOUNTER — Ambulatory Visit: Payer: Medicare Other | Admitting: Neurology

## 2022-04-26 ENCOUNTER — Other Ambulatory Visit: Payer: Self-pay | Admitting: Family Medicine

## 2022-04-26 DIAGNOSIS — G8929 Other chronic pain: Secondary | ICD-10-CM

## 2022-04-27 ENCOUNTER — Telehealth: Payer: Self-pay | Admitting: *Deleted

## 2022-04-27 NOTE — Chronic Care Management (AMB) (Signed)
  Care Coordination   Note   04/27/2022 Name: Veronica Shaw MRN: [id number] DOB: 1950/10/21  [name] is a 71 y.o. year old female who sees Gerrit Heck, MD for primary care. I reached out to Terie Purser by phone today to reschedule follow up with [facility] for care coordination services.   Follow up plan:  Unsuccessful telephone outreach attempt made. A HIPAA compliant phone message was left for the patient providing contact information and requesting a return call.  Encounter Outcome:  No Answer  [place]  Direct Dial: [phone]

## 2022-05-02 ENCOUNTER — Ambulatory Visit (INDEPENDENT_AMBULATORY_CARE_PROVIDER_SITE_OTHER): Payer: Medicare Other

## 2022-05-02 DIAGNOSIS — Z95810 Presence of automatic (implantable) cardiac defibrillator: Secondary | ICD-10-CM

## 2022-05-02 DIAGNOSIS — I5022 Chronic systolic (congestive) heart failure: Secondary | ICD-10-CM | POA: Diagnosis not present

## 2022-05-02 NOTE — Chronic Care Management (AMB) (Signed)
  Care Coordination   Note   05/02/2022 Name: Veronica Shaw MRN: [id number] DOB: 06-13-1951  Veronica Shaw is a 71 y.o. year old female who sees Gerrit Heck, MD for primary care. I reached out to Terie Purser by phone today to reschedule care coordination services.   Follow up plan:  Telephone appointment with care coordination team member scheduled for:  05/12/22  Encounter Outcome:  Pt. Scheduled  [place]  Direct Dial: [phone]

## 2022-05-03 ENCOUNTER — Ambulatory Visit
Admission: RE | Admit: 2022-05-03 | Discharge: 2022-05-03 | Disposition: A | Discharge status: Home / Self Care | Payer: Medicare Other | Source: Ambulatory Visit | Attending: Family Medicine | Admitting: Family Medicine

## 2022-05-03 DIAGNOSIS — Z1231 Encounter for screening mammogram for malignant neoplasm of breast: Secondary | ICD-10-CM

## 2022-05-06 NOTE — Progress Notes (Signed)
EPIC Encounter for ICM Monitoring  Patient Name: Veronica Shaw is a 71 y.o. female Date: 05/06/2022 Primary Care Physican: Gerrit Heck, MD Primary Cardiologist: Cooper/Weaver PA Electrophysiologist: Lovena Le 05/06/2022 Weight: 119 lbs                                      Spoke with patient and heart failure questions reviewed.  Transmission results reviewed.  Pt has neuropathy in hands and also having Parkinson's Disease.     OptiVol Thoracic impedance suggesting normal fluid levels.   Prescribed:  Furosemide 20 mg Take 1 tablet (20 mg total) by mouth daily.   Potassium 10 mEq take 1 tablet (10 mEq total) by mouth daily Spironolactone 25 mg take 0.5 tablet 912.5 mg total) by mouth daily   Labs: 03/19/2022 Creatinine 1.24, BUN 29, Potassium 4.5, Sodium 138 01/06/2022 Creatinine 1.25, BUN 24, Potassium 4.3, Sodium 140 08/20/2021 Creatinine 1.11, BUN 21, Potassium 4.3, Sodium 140, GFR 53 A complete set of results can be found in Results Review.   Recommendations:   No changes and encouraged to call if experiencing any fluid symptoms.   Follow-up plan: ICM clinic phone appointment on 06/13/2022.   91 day device clinic remote transmission 05/26/2022.     EP/Cardiology Office Visits:  06/28/2022 with Richardson Dopp, PA.   Recalls 01/17/2022 for [name].  12/22/2021 with Dr Burt Knack.   Copy of ICM check sent to Dr. Lovena Le.   3 month ICM trend: 05/02/2022.    12-14 Month ICM trend:     [name], RN 05/06/2022 10:48 AM

## 2022-05-12 ENCOUNTER — Ambulatory Visit: Payer: Self-pay

## 2022-05-12 NOTE — Patient Instructions (Signed)
Visit Information  Thank you for taking time to visit with me today. Please don't hesitate to contact me if I can be of assistance to you.   Following are the goals we discussed today:   Goals Addressed             This Visit's Progress    Patient to manage and monitor signs and symptoms of Heart Failure       Care Coordination Interventions:  Provided education on low sodium diet Reviewed Heart Failure Action Plan in depth and provided  Discussed importance of daily weight and advised patient to weigh and record daily Discussed the importance of keeping all appointments with provider Active listening / Reflection utilized  Emotional Support Provided Problem [place] strategies reviewed Veronica Shaw is still having issues with ear pain and dizziness. She is having to use her walker and no longer uses the cane. An appointment was set up for tomorrow, 05/13/22, with ENT. I advised her to be careful when ambulating so she would not fall and hurt herself. She denies any chest pain or swelling. She typically has shortness of breath but uses her inhaler, and it seems to help.         Our next appointment is by telephone on 11/14 at 1045 am  Please call the care guide team at [phone] if you need to cancel or reschedule your appointment.   If you are experiencing a Mental Health or [place] or need someone to talk to, please call 1-800-273-TALK (toll free, 24 hour hotline)  Patient verbalizes understanding of instructions and care plan provided today and agrees to view in [facility]. Active MyChart status and patient understanding of how to access instructions and care plan via MyChart confirmed with patient.     Lazaro Arms RN, BSN, [facility]   Phone: [phone]

## 2022-05-12 NOTE — Patient Outreach (Signed)
  Care Coordination   Follow Up Visit Note   05/12/2022 Name: Veronica Shaw MRN: [id number] DOB: 06-17-1951  Veronica Shaw is a 71 y.o. year old female who sees Veronica Heck, MD for primary care. I spoke with  Veronica Shaw by phone today.  What matters to the patients health and wellness today?  I am still having dizziness    Goals Addressed             This Visit's Progress    Patient to manage and monitor signs and symptoms of Heart Failure       Care Coordination Interventions:  Provided education on low sodium diet Reviewed Heart Failure Action Plan in depth and provided  Discussed importance of daily weight and advised patient to weigh and record daily Discussed the importance of keeping all appointments with provider Active listening / Reflection utilized  Emotional Support Provided Problem [place] strategies reviewed Veronica Shaw is still having issues with ear pain and dizziness. She is having to use her walker and no longer uses the cane. An appointment was set up for tomorrow, 05/13/22, with ENT. I advised her to be careful when ambulating so she would not fall and hurt herself. She denies any chest pain or swelling. She typically has shortness of breath but uses her inhaler, and it seems to help.         SDOH assessments and interventions completed:  No     Care Coordination Interventions Activated:  Yes  Care Coordination Interventions:  Yes, provided   Follow up plan: Follow up call scheduled for 11/14 1045 am  Encounter Outcome:  Pt. Visit Completed   Lazaro Arms RN, BSN, [facility]   Phone: [phone]

## 2022-05-13 DIAGNOSIS — H9203 Otalgia, bilateral: Secondary | ICD-10-CM | POA: Diagnosis not present

## 2022-05-14 ENCOUNTER — Other Ambulatory Visit: Payer: Self-pay | Admitting: Neurology

## 2022-05-14 DIAGNOSIS — G20B1 Parkinson's disease with dyskinesia, without mention of fluctuations: Secondary | ICD-10-CM

## 2022-05-26 ENCOUNTER — Ambulatory Visit (INDEPENDENT_AMBULATORY_CARE_PROVIDER_SITE_OTHER): Payer: Medicare Other

## 2022-05-26 DIAGNOSIS — I428 Other cardiomyopathies: Secondary | ICD-10-CM

## 2022-05-27 LAB — CUP PACEART REMOTE DEVICE CHECK
Battery Remaining Longevity: 18 mo
Battery Voltage: 2.9 V
Brady Statistic AP VP Percent: 0 %
Brady Statistic AP VS Percent: 0 %
Brady Statistic AS VP Percent: 0.03 %
Brady Statistic AS VS Percent: 99.96 %
Brady Statistic RA Percent Paced: 0.01 %
Brady Statistic RV Percent Paced: 0.03 %
Date Time Interrogation Session: 20231026074226
HighPow Impedance: 74 Ohm
Implantable Lead Connection Status: 753985
Implantable Lead Connection Status: 753985
Implantable Lead Implant Date: 20150427
Implantable Lead Implant Date: 20150427
Implantable Lead Location: 753859
Implantable Lead Location: 753860
Implantable Lead Model: 5076
Implantable Lead Model: 6935
Implantable Pulse Generator Implant Date: 20150427
Lead Channel Impedance Value: 399 Ohm
Lead Channel Impedance Value: 4047 Ohm
Lead Channel Impedance Value: 4047 Ohm
Lead Channel Impedance Value: 4047 Ohm
Lead Channel Impedance Value: 513 Ohm
Lead Channel Impedance Value: 589 Ohm
Lead Channel Pacing Threshold Amplitude: 0.5 V
Lead Channel Pacing Threshold Amplitude: 0.625 V
Lead Channel Pacing Threshold Pulse Width: 0.4 ms
Lead Channel Pacing Threshold Pulse Width: 0.4 ms
Lead Channel Sensing Intrinsic Amplitude: 11.5 mV
Lead Channel Sensing Intrinsic Amplitude: 11.5 mV
Lead Channel Sensing Intrinsic Amplitude: 2.375 mV
Lead Channel Sensing Intrinsic Amplitude: 2.375 mV
Lead Channel Setting Pacing Amplitude: 2 V
Lead Channel Setting Pacing Amplitude: 2.5 V
Lead Channel Setting Pacing Pulse Width: 0.4 ms
Lead Channel Setting Sensing Sensitivity: 0.3 mV
Zone Setting Status: 755011
Zone Setting Status: 755011

## 2022-06-01 IMAGING — DX DG CHEST 2V
2 series · 2 of 2 positions shown · non-contrast
Comparison: 10/17/2020

CLINICAL DATA: Shortness of breath, cough

EXAM:
CHEST - 2 VIEW

[chest pa]
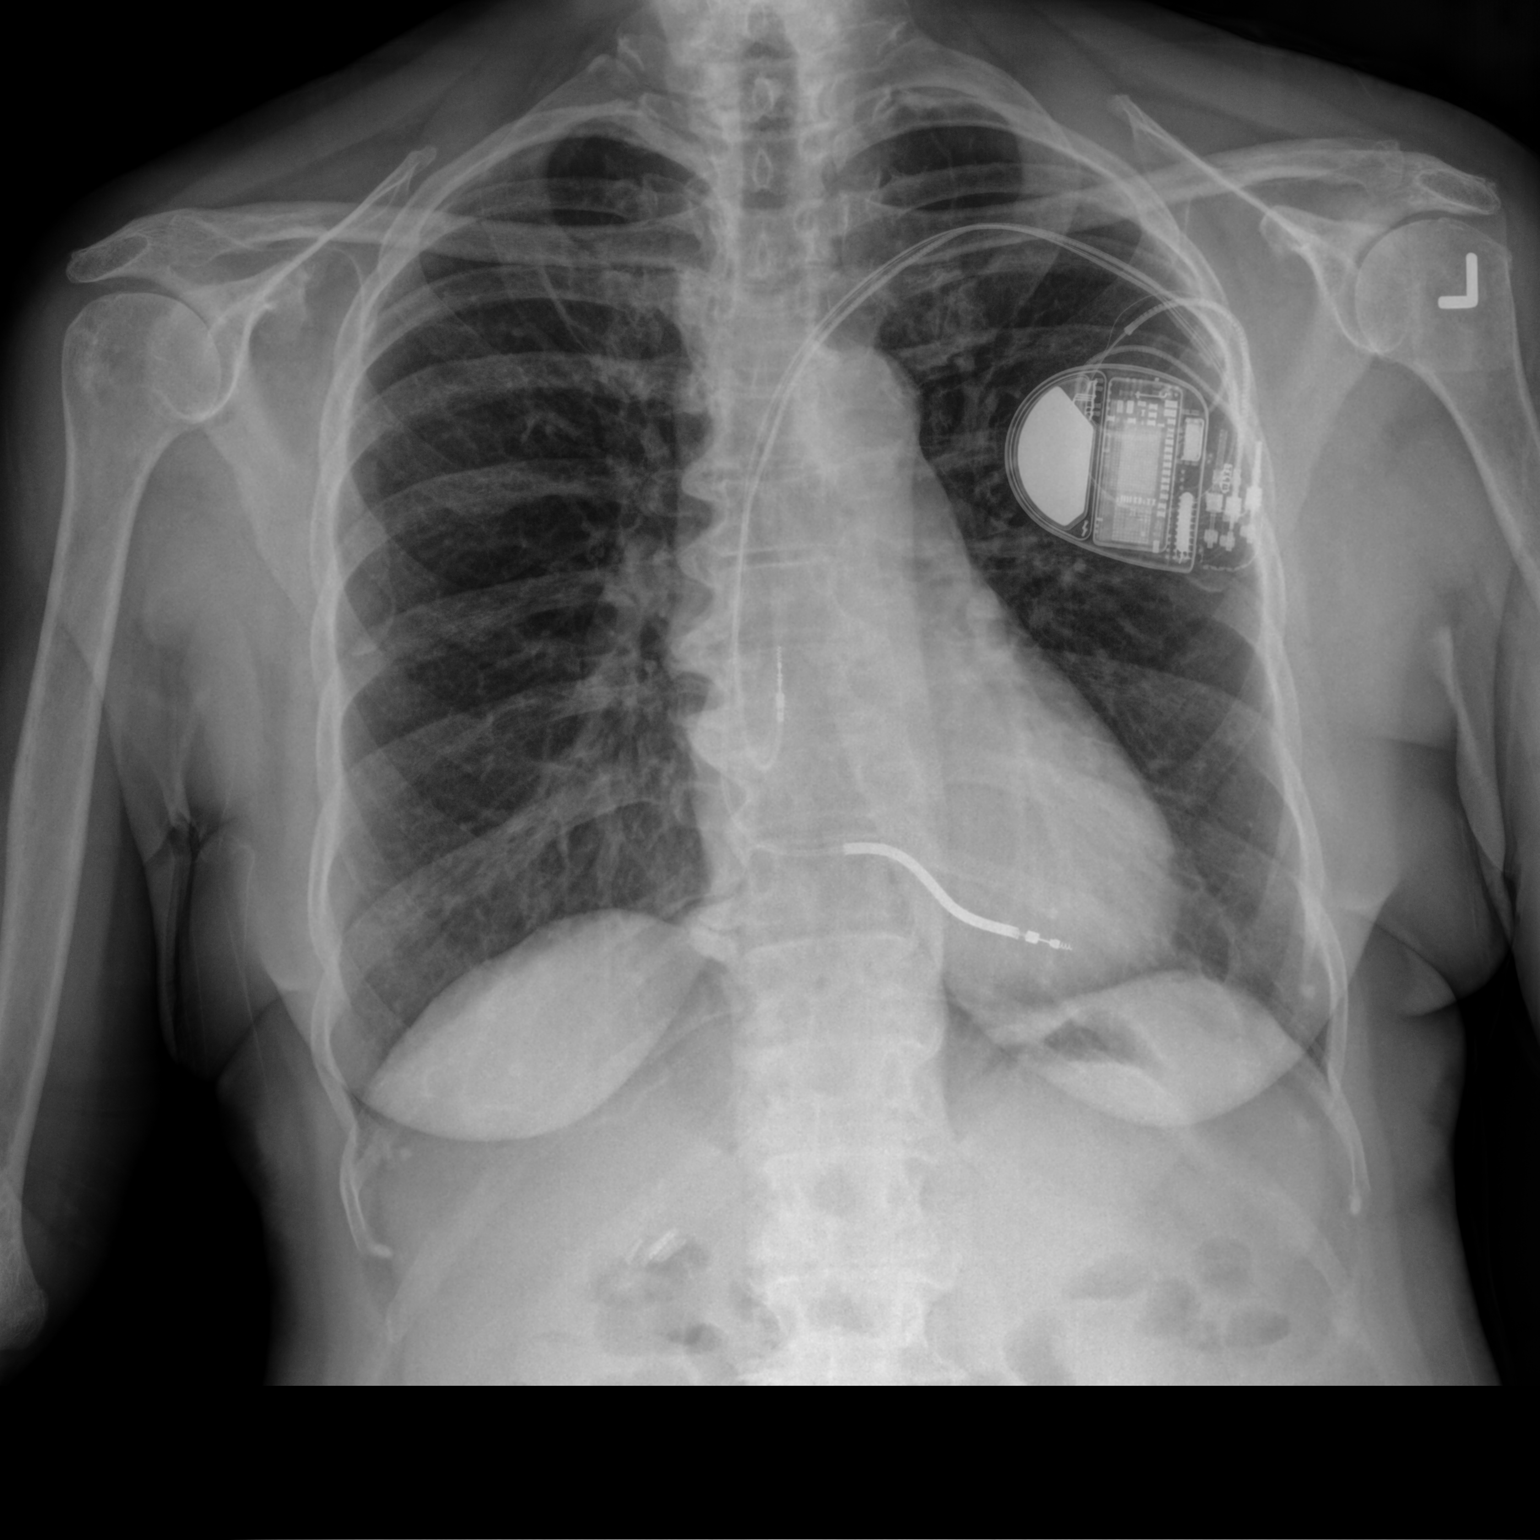

[chest lat]
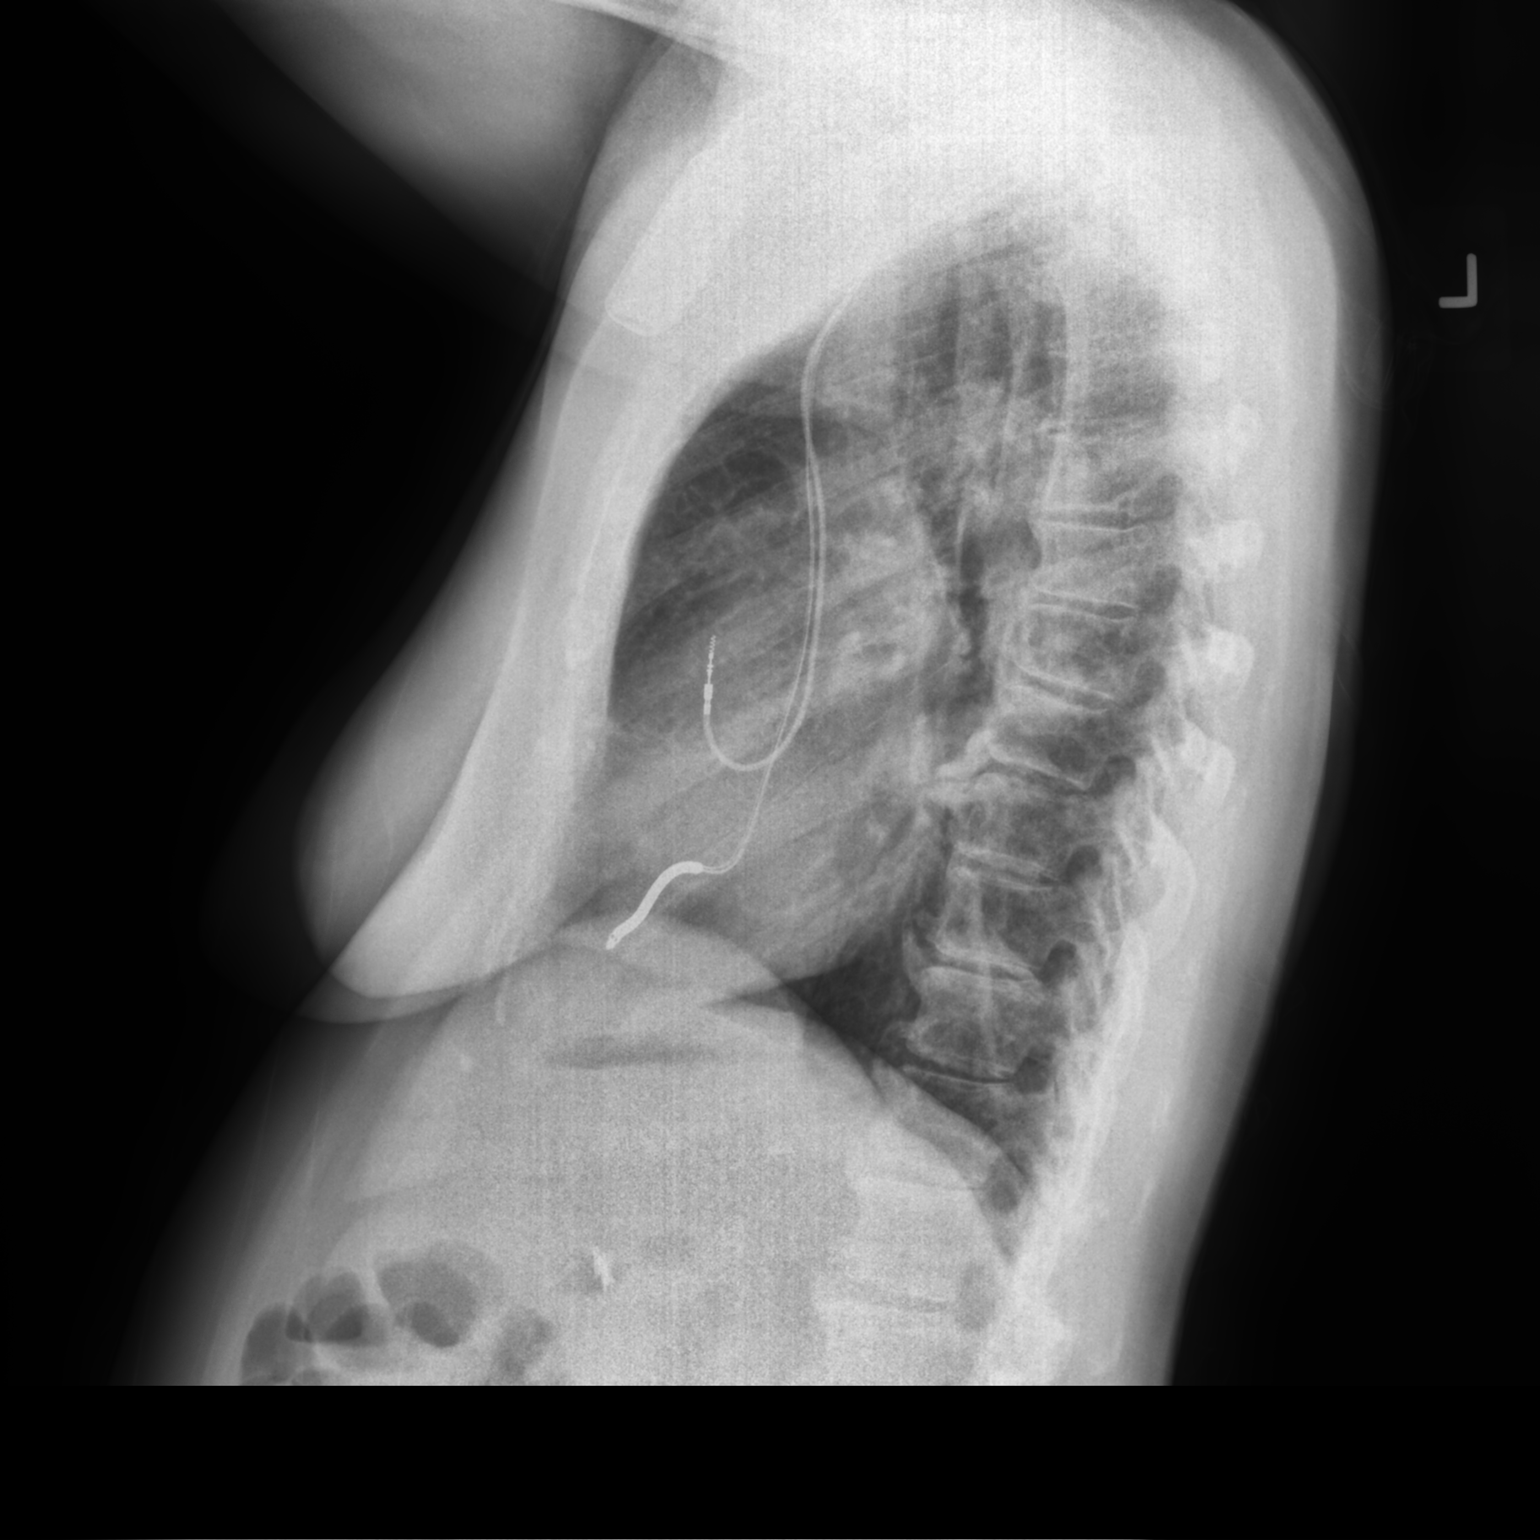

[2 of 2 positions shown; findings below may reference images not displayed]

FINDINGS: LEFT subclavian ICD with leads projecting at RIGHT atrium and RIGHT
ventricle unchanged.

Normal heart size, mediastinal contours, and pulmonary vascularity.

Atherosclerotic calcification aorta.

Lungs clear.

Calcified granuloma LEFT costophrenic angle again noted.

No infiltrate, pleural effusion, or pneumothorax.

Multilevel endplate spur formation thoracic spine.
IMPRESSION: No acute abnormalities.

## 2022-06-02 NOTE — Progress Notes (Signed)
Remote ICD transmission.   

## 2022-06-08 DIAGNOSIS — Z23 Encounter for immunization: Secondary | ICD-10-CM | POA: Diagnosis not present

## 2022-06-10 ENCOUNTER — Encounter: Payer: Self-pay | Admitting: Student

## 2022-06-13 ENCOUNTER — Ambulatory Visit (INDEPENDENT_AMBULATORY_CARE_PROVIDER_SITE_OTHER): Payer: Medicare Other

## 2022-06-13 ENCOUNTER — Telehealth: Payer: Self-pay | Admitting: Neurology

## 2022-06-13 DIAGNOSIS — I5022 Chronic systolic (congestive) heart failure: Secondary | ICD-10-CM

## 2022-06-13 DIAGNOSIS — Z95810 Presence of automatic (implantable) cardiac defibrillator: Secondary | ICD-10-CM

## 2022-06-13 NOTE — Telephone Encounter (Signed)
Patient's granddaughter Veronica Shaw called and said patient has fallen at least 10 times recently. She wanted Dr. Carles Collet to know about this and that patient is fairly non-compliant, her words.  [name] requested a call from the office to the patient to help with compliance for scheduling, patient is past due for an appointment. I called the patient and scheduled her with Dr. Carles Collet 05/21/22 at 11:15 AM.   Also, sent patient a reminder in the mail, patient request.   Followed up with [facility] and confirmed appointment date and time.

## 2022-06-14 ENCOUNTER — Ambulatory Visit: Payer: Self-pay

## 2022-06-15 NOTE — Patient Outreach (Addendum)
  Care Coordination   Follow Up Visit Note   06/14/2022 Name: Veronica Shaw MRN: [id number] DOB: July 05, 1951  Veronica Shaw is a 71 y.o. year old female who sees Gerrit Heck, MD for primary care. I spoke with  Terie Purser by phone today.  What matters to the patients health and wellness today?  I have had falls    Goals Addressed             This Visit's Progress    Patient to manage and monitor signs and symptoms of Heart Failure       Care Coordination Interventions:  Provided education on low sodium diet Reviewed Heart Failure Action Plan in depth and provided  Discussed importance of daily weight and advised patient to weigh and record daily Discussed the importance of keeping all appointments with provider Active listening / Reflection utilized  Emotional Support Provided Problem Veronica Shaw strategies reviewed I recently spoke with Veronica Shaw, who informed me that she is doing well with her CHF and isn't experiencing any swelling or chest pain. However, she has been experiencing several falls. During our conversation, we talked about fall prevention measures and she mentioned having an appointment with Dr. Carles Collet on 06/21/22 to discuss this further with her granddaughter. Veronica Shaw also mentioned that her Parkinson's has become worse and is now affecting her ability to check her blood sugar levels. She requested that I send a message to her physician to see if it would be possible for her to use a Libre meter because it is difficult for her to test.        SDOH assessments and interventions completed:  No     Care Coordination Interventions Activated:  Yes  Care Coordination Interventions:  Yes, provided   Follow up plan: Follow up call scheduled for 12/5  11  am    Encounter Outcome:  Pt. Visit Completed

## 2022-06-15 NOTE — Patient Instructions (Signed)
Visit Information  Thank you for taking time to visit with me today. Please don't hesitate to contact me if I can be of assistance to you.   Following are the goals we discussed today:   Goals Addressed             This Visit's Progress    Patient to manage and monitor signs and symptoms of Heart Failure       Care Coordination Interventions:  Provided education on low sodium diet Reviewed Heart Failure Action Plan in depth and provided  Discussed importance of daily weight and advised patient to weigh and record daily Discussed the importance of keeping all appointments with provider Active listening / Reflection utilized  Emotional Support Provided Problem [name] strategies reviewed I recently spoke with [name], who informed me that she is doing well with her CHF and isn't experiencing any swelling or chest pain. However, she has been experiencing several falls. During our conversation, we talked about fall prevention measures and she mentioned having an appointment with Dr. Carles Collet on 06/21/22 to discuss this further with her granddaughter. [name] also mentioned that her Parkinson's has become worse and is now affecting her ability to check her blood sugar levels. She requested that I send a message to her physician to see if it would be possible for her to use a Libre meter because it is difficult for her to test.        Our next appointment is by telephone on 12/5 at 11 am  Please call the care guide team at [phone] if you need to cancel or reschedule your appointment.   If you are experiencing a Mental Health or [place] or need someone to talk to, please call 1-800-273-TALK (toll free, 24 hour hotline)  Patient verbalizes understanding of instructions and care plan provided today and agrees to view in [place]. Active MyChart status and patient understanding of how to access instructions and care plan via MyChart confirmed with patient.      Lazaro Arms RN, BSN, [facility]   Phone: [phone]

## 2022-06-16 NOTE — Telephone Encounter (Signed)
Patient scheduled for diabetes follow up on Tuesday 11/21, per patient request. PCP did not have availability on this day. Scheduled with Dr. Gwendlyn Deutscher as patient requested to have appointment on same day as neurologist appointment.   FYI to PCP and Dr. Gwendlyn Deutscher.   Talbot Grumbling, RN
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
## 2022-06-17 NOTE — Progress Notes (Signed)
EPIC Encounter for ICM Monitoring  Patient Name: Veronica Shaw is a 71 y.o. female Date: 06/17/2022 Primary Care Physican: Gerrit Heck, MD Primary Cardiologist: Cooper/Weaver PA Electrophysiologist: Lovena Le 05/06/2022 Weight: 119 lbs 06/17/2022 Weight: 119 lbs                                    Spoke with patient and heart failure questions reviewed.  Transmission results reviewed.  Pt reports she is feeling well and denies any fluid symptoms.  She has a lot of falls.    OptiVol Thoracic impedance suggesting normal fluid levels.   Prescribed:  Furosemide 20 mg Take 1 tablet (20 mg total) by mouth daily.   Potassium 10 mEq take 1 tablet (10 mEq total) by mouth daily Spironolactone 25 mg take 0.5 tablet 912.5 mg total) by mouth daily   Labs: 03/19/2022 Creatinine 1.24, BUN 29, Potassium 4.5, Sodium 138 01/06/2022 Creatinine 1.25, BUN 24, Potassium 4.3, Sodium 140 08/20/2021 Creatinine 1.11, BUN 21, Potassium 4.3, Sodium 140, GFR 53 A complete set of results can be found in Results Review.   Recommendations:   No changes and encouraged to call if experiencing any fluid symptoms.   Follow-up plan: ICM clinic phone appointment on 07/18/2022.   91 day device clinic remote transmission 08/25/2022.     EP/Cardiology Office Visits:  06/28/2022 with Richardson Dopp, PA.   Recalls 01/17/2022 for [name].  Recall 12/17/2022 with Dr Burt Knack.   Copy of ICM check sent to Dr. Lovena Le.    3 month ICM trend: 06/13/2022.    12-14 Month ICM trend:     [name], RN 06/17/2022 4:33 PM

## 2022-06-21 ENCOUNTER — Encounter: Payer: Self-pay | Admitting: Neurology

## 2022-06-21 ENCOUNTER — Ambulatory Visit: Payer: Medicare Other | Admitting: Family Medicine

## 2022-06-21 ENCOUNTER — Ambulatory Visit (INDEPENDENT_AMBULATORY_CARE_PROVIDER_SITE_OTHER): Payer: Medicare Other | Admitting: Neurology

## 2022-06-21 VITALS — BP 118/78 | HR 76 | Ht 61.0 in | Wt 119.0 lb

## 2022-06-21 DIAGNOSIS — Z91199 Patient's noncompliance with other medical treatment and regimen due to unspecified reason: Secondary | ICD-10-CM | POA: Diagnosis not present

## 2022-06-21 DIAGNOSIS — G20B1 Parkinson's disease with dyskinesia, without mention of fluctuations: Secondary | ICD-10-CM | POA: Diagnosis not present

## 2022-06-21 DIAGNOSIS — G903 Multi-system degeneration of the autonomic nervous system: Secondary | ICD-10-CM | POA: Diagnosis not present

## 2022-06-21 DIAGNOSIS — Z23 Encounter for immunization: Secondary | ICD-10-CM | POA: Diagnosis not present

## 2022-06-21 MED ORDER — CARBIDOPA-LEVODOPA 25-100 MG PO TABS: ORAL_TABLET | ORAL | 1 refills | Status: DC

## 2022-06-21 NOTE — Patient Instructions (Addendum)
You need to take medication as prescribed and bring family to appointments with you.    Increase carbidopa/levodopa 25/100 to 2 tablets at 7am, 2 at 11am, 1 tablet at 4pm  Watch your Blood Pressure!  Talk to your cardiologist about the spirololactone and if you still need it.  Parkinsons Disease can lower blood pressure and many patients get off blood pressure medication with time.  Local and Online Resources for Power over Parkinson's Group  November 2023    LOCAL [place] PARKINSON'S GROUPS   Power over Parkinson's Group:    Power Over Parkinson's Patient Education Group will be Wednesday, November 8th-*Hybrid meting*- in person at [facility] location and via [facility], 2:00-3:00 pm.   Starting in November, Power over [employer] and Care Partner Groups will meet together, with plans for separate break out session for caregivers (*this will be evolving over the next few months) Upcoming Power over Parkinson's Meetings/Care Partner Support:  2nd Wednesdays of the month at 2 pm:   November 8th, December 13th  [place] at [email or web address] if interested in participating in this group    [place] and Fall Prevention Workshop.  Thursday, November 9th 1-2pm, Studio [place], [employer].  Register with Vonna Kotyk at [email or web address] or [phone] New PWR! Moves [employer] Instructor-Led Classes offering at [employer]!  TUESDAYS and Wednesdays 1-2 pm.   Contact Vonna Kotyk at  [email or web address]  or [phone] (Tuesday classes are modified for chair and standing only) Dance for Parkinson 's classes will be on Tuesdays 9:30am-10:30am starting October 3-December 12 with a break the week of November 21st. Located in the [employer] , in the first floor of the [employer] ([place] To register:  [email or web address] or [phone]  Drumming for  Parkinson's will be held on 2nd and 4th Mondays at 11:00 am.   Located at the [place] ([place]  [place] at [email or web address] or [phone]  Through support from the [place] for Parkinson's classes are free for both patients and caregivers.    [employer] Parkinson's Tai Chi Class, Mondays at 11 am.  Call [phone] for details Parkinson's Holiday Party.  Wednesday, December 6th, 4:00-5:00 pm.  [facility] and Fitness.  RSVP to Garnetta Buddy at [phone] or [email or web address]   [place]:  [email or web address]  PD Health at Home continues:  Mindfulness Mondays, Wellness Wednesdays, Fitness Fridays   Upcoming Education:   Why Should you Participate in Parkinson's Research?  Wednesday, Nov. 29th,  1-2 pm  Expert Briefing:    Hallucinations and Delusions in Parkinson's.  Wednesday, Nov. 8th, 1-2 pm  Register for expert briefings (webinars) at [email or web address]  Please check out their website to sign up for emails and see their full online offerings      [place]:  [email or web address]   Third Thursday Webinars:  On the third Thursday of every month at 12 p.m. ET, join our free live webinars to learn about various aspects of living with Parkinson's disease and our work to speed medical breakthroughs.  Upcoming Webinar:  A Year Like No Other in Parkinson's Research:  2023 in Review.  Thursday, November 16th 12 noon. Check out additional information on their website to see their full online offerings    [facility]:  [email or web address]  Upcoming Webinar:   Stay tuned  Webinar Series:  Living with  Parkinson's Meetup.   Third Thursdays each month, 3 pm  Care Partner Monthly Meetup.  With Robin Searing Phinney.  First  Tuesday of each month, 2 pm  Check out additional information to Live Well Today on their website    Parkinson and Movement Disorders (PMD) Alliance:  [email or web address]  [employer]:  Online Education Events  Sign up for emails, which are sent weekly to give you updates on programming and online offerings    Parkinson's Association of the [place]:  www.[email or web address]  Information on online support groups, education events, and online exercises including Yoga, Parkinson's exercises and more-LOTS of information on links to PD resources and online events  Virtual Support Group through Parkinson's Association of the [place]; next one is scheduled for Wednesday, November 1st  at 2 pm.  (These are typically scheduled for the 1st Wednesday of the month at 2 pm).  Visit website for details.   MOVEMENT AND EXERCISE OPPORTUNITIES  PWR! Moves Classes at [place].  Wednesdays 10 and 11 am.   Contact Amy Marriott, PT [email or web address] if interested.  NEW PWR! Moves Class offerings at [employer].  *TUESDAYS* and Wednesdays 1-2 pm.  Contact Vonna Kotyk at  [email or web address]    Parkinson's Wellness Recovery (PWR! Moves)  [email or web address]  Info on the PWR! Virtual Experience:  You will have access to our expertise?through self-assessment, guided plans that start with the PD-specific fundamentals, educational content, tips, Q&A with an expert, and a growing Art therapist of PD-specific pre-recorded and live exercise classes of varying types and intensity - both physical and cognitive! If that is not enough, we offer 1:1 wellness consultations (in-person or virtual) to personalize your PWR! Research scientist (medical).   [place] Fridays:   As part of the PD Health @ Home program, this free video series focuses each week on one aspect of fitness designed to support people living with Parkinson's.? These weekly videos highlight the [place] fitness guidelines for people with Parkinson's disease.  [email or web address]   Dance for PD website is offering free, live-stream classes throughout the week, as well as links to [employer] of classes:  [email or web address]  Virtual dance and Pilates for Parkinson's classes: Click on the Community Tab> Parkinson's Movement Initiative Tab.  To register for classes and for more information, visit [email or web address] and click the "community" tab.   [employer] Parkinson's Cycling Classes   [employer]:  Thursdays @ Noon-Live classes at [employer] ([employer] at [email or web address]?or [phone])  [employer]: Virtual Classes Mondays and Thursdays Jeanette Caprice classes Tuesday, Wednesday and Thursday (contact [place] at [email or web address] ?or [phone])  [place]  Varied levels of classes are offered Tuesdays and Thursdays at [employer].   Stretching with Verdis Frederickson weekly class is also offered for people with Parkinson's  To observe a class or for more information, call [phone] or email Hezzie Bump at [email or web address]   ADDITIONAL SUPPORT AND RESOURCES  Well[employer]:Online Caregiver Education Opportunities:  www.well-[email or web address]/caregiver-education/caregiver-support-group.  You may also contact Vickki Muff at [email or web address] or [phone].     Well-Spring Navigator:  Just1Navigator program, a?free service to help individuals and families through the journey of determining care for older adults.  The "Navigator" is a Education officer, museum, Arnell Asal, who will speak with a prospective client and/or loved ones to provide an assessment of the situation and a set of recommendations for a personalized care plan -- all free of charge, and whether?Well[employer]Spring [employer] offers the needed service or not. If the need is not a  service we provide, we are well-connected with reputable programs in town that we can refer you to.  www.well-springsolutions.org or to speak with the Navigator, call [phone].

## 2022-06-22 ENCOUNTER — Ambulatory Visit: Payer: Medicare Other | Admitting: Family Medicine

## 2022-06-27 NOTE — Progress Notes (Unsigned)
Cardiology Office Note:    Date:  06/28/2022   ID:  Veronica Shaw, DOB 10-Jun-1951, MRN [id number]  PCP:  Gerrit Heck, [place] Providers Cardiologist:  Sherren Mocha, MD Cardiology APP:  Liliane Shi, PA-C  Electrophysiologist:  Cristopher Peru, MD    Referring MD: Lyndee Hensen, DO   Chief Complaint:  F/u for CHF    Patient Profile: HFmrEF (heart failure with mildly reduced ejection fraction)  Non-ischemic cardiomyopathy Cath 2014: no sig CAD Myoview 12/29/16: EF 32, no ischemia  CMR 4/15: EF 34 Echocardiogram 5/17: EF 20-25 Echocardiogram 6/18: EF 30-35 Echo 10/18/17: EF 60-65, normal wall motion, grade 2 diastolic dysfunction  Echo 10/18/20: EF 40-45, global HK, normal RVSF, RVSP 22  S/p AICD Diabetes mellitus Endo: Dr. Kelton Pillar Hyperlipidemia Chronic kidney disease  OSA Asthma Parkinson's Disease Neuro: Dr. Carles Collet Syncope 09/2017 Cervical spine DDD  Cardiac Studies & Procedures     STRESS TESTS  MYOCARDIAL PERFUSION IMAGING 12/29/2016  Narrative  Nuclear stress EF: 49%.  No T wave inversion was noted during stress.  There was no ST segment deviation noted during stress.  Defect 1: There is a large defect of moderate severity.  This is an intermediate risk study.  Large size, moderate intensity fixed inferoseptal/septal perfusion defect consistent with LBBB artifact. No reversible ischemia. LVEF 49% with incoordinate septal motion. This is an intermediate risk study (due to LVEF <50).   ECHOCARDIOGRAM  ECHOCARDIOGRAM COMPLETE 10/18/2020  Narrative ECHOCARDIOGRAM REPORT    Patient Name:   Veronica Shaw Date of Exam: 10/18/2020 Medical Rec #:  [id number]          Height:       61.5 in Accession #:    [id number]         Weight:       130.1 lb Date of Birth:  Mar 15, 1951           BSA:          1.583 m Patient Age:    71 years           BP:           122/59 mmHg Patient Gender: F                  HR:           76 bpm. Exam  Location:  Inpatient  Procedure: 2D Echo, Cardiac Doppler and Color Doppler  Indications:    Abnormal ECG  History:        Patient has prior history of Echocardiogram examinations, most recent 10/18/2017. CHF, Defibrillator, Signs/Symptoms:Shortness of Breath; Risk Factors:Hypertension, Diabetes and Dyslipidemia. NICM.  Sonographer:    Clayton Lefort RDCS (AE) Referring Phys: [place]   1. Left ventricular ejection fraction, by estimation, is 40 to 45%. The left ventricle has mildly decreased function. The left ventricle demonstrates global hypokinesis. Left ventricular diastolic function could not be evaluated. 2. Right ventricular systolic function is normal. The right ventricular size is normal. There is normal pulmonary artery systolic pressure. The estimated right ventricular systolic pressure is 36.6 mmHg. 3. The mitral valve is normal in structure. No evidence of mitral valve regurgitation. No evidence of mitral stenosis. 4. The aortic valve is normal in structure. Aortic valve regurgitation is not visualized. No aortic stenosis is present. 5. The inferior vena cava is normal in size with greater than 50% respiratory variability, suggesting right atrial pressure of 3 mmHg.  FINDINGS Left Ventricle:  Left ventricular ejection fraction, by estimation, is 40 to 45%. The left ventricle has mildly decreased function. The left ventricle demonstrates global hypokinesis. The left ventricular internal cavity size was normal in size. There is no left ventricular hypertrophy. Abnormal (paradoxical) septal motion, consistent with RV pacemaker. Left ventricular diastolic function could not be evaluated due to paced rhythm. Left ventricular diastolic function could not be evaluated. Normal left ventricular filling pressure.  Right Ventricle: The right ventricular size is normal. No increase in right ventricular wall thickness. Right ventricular systolic function is normal.  There is normal pulmonary artery systolic pressure. The tricuspid regurgitant velocity is 2.18 m/s, and with an assumed right atrial pressure of 3 mmHg, the estimated right ventricular systolic pressure is 35.5 mmHg.  Left Atrium: Left atrial size was normal in size.  Right Atrium: Right atrial size was normal in size.  Pericardium: There is no evidence of pericardial effusion.  Mitral Valve: The mitral valve is normal in structure. No evidence of mitral valve regurgitation. No evidence of mitral valve stenosis.  Tricuspid Valve: The tricuspid valve is normal in structure. Tricuspid valve regurgitation is mild . No evidence of tricuspid stenosis.  Aortic Valve: The aortic valve is normal in structure. Aortic valve regurgitation is not visualized. No aortic stenosis is present. Aortic valve mean gradient measures 1.0 mmHg. Aortic valve peak gradient measures 3.1 mmHg. Aortic valve area, by VTI measures 3.39 cm.  Pulmonic Valve: The pulmonic valve was normal in structure. Pulmonic valve regurgitation is not visualized. No evidence of pulmonic stenosis.  Aorta: The aortic root is normal in size and structure.  Venous: The inferior vena cava is normal in size with greater than 50% respiratory variability, suggesting right atrial pressure of 3 mmHg.  IAS/Shunts: No atrial level shunt detected by color flow Doppler.  Additional Comments: A device lead is visualized.   LEFT VENTRICLE PLAX 2D LVIDd:         4.20 cm  Diastology LVIDs:         3.10 cm  LV e' medial:    5.44 cm/s LV PW:         1.00 cm  LV E/e' medial:  11.0 LV IVS:        0.80 cm  LV e' lateral:   6.74 cm/s LVOT diam:     2.20 cm  LV E/e' lateral: 8.9 LV SV:         54 LV SV Index:   34 LVOT Area:     3.80 cm   RIGHT VENTRICLE             IVC RV Basal diam:  2.30 cm     IVC diam: 2.00 cm RV S prime:     11.70 cm/s TAPSE (M-mode): 1.9 cm  LEFT ATRIUM             Index       RIGHT ATRIUM          Index LA diam:         1.50 cm 0.95 cm/m  RA Area:     9.49 cm LA Vol (A2C):   22.8 ml 14.41 ml/m RA Volume:   16.60 ml 10.49 ml/m LA Vol (A4C):   28.7 ml 18.13 ml/m LA Biplane Vol: 27.5 ml 17.38 ml/m AORTIC VALVE AV Area (Vmax):    2.60 cm AV Area (Vmean):   2.83 cm AV Area (VTI):     3.39 cm AV Vmax:  87.90 cm/s AV Vmean:          57.300 cm/s AV VTI:            0.159 m AV Peak Grad:      3.1 mmHg AV Mean Grad:      1.0 mmHg LVOT Vmax:         60.10 cm/s LVOT Vmean:        42.600 cm/s LVOT VTI:          0.142 m LVOT/AV VTI ratio: 0.89  AORTA Ao Root diam: 3.10 cm Ao Asc diam:  3.00 cm  MITRAL VALVE               TRICUSPID VALVE MV Area (PHT): 3.46 cm    TR Peak grad:   19.0 mmHg MV Decel Time: 219 msec    TR Vmax:        218.00 cm/s MV E velocity: 60.00 cm/s MV A velocity: 90.00 cm/s  SHUNTS MV E/A ratio:  0.67        Systemic VTI:  0.14 m Systemic Diam: 2.20 cm  Fransico Him MD Electronically signed by Fransico Him MD Signature Date/Time: 10/18/2020/12:38:48 PM    Final              History of Present Illness:   Sury Zissy Hamlett is a 71 y.o. female with the above problem list.  She was last seen by Dr. Burt Knack 11/30/21. Last ICM clinic transmission was 06/13/22. She has normal thoracic impedance at that time. She returns for f/u.  She is here alone.  She walks with a walker now.  She notes her Parkinson's is getting worse.  She is fallen several times.  She does get lightheaded in the mornings after taking her medications.  Her neurologist recently asked if she can come off of spironolactone due to low blood pressure.  She has not had chest pain, leg edema.  She sleeps on an incline chronically.  She sometimes has to use an inhaler when she awakens.     Past Medical History:  Diagnosis Date   Acute on chronic systolic CHF (congestive heart failure), NYHA class 4 (HCC) 10/19/2020   Asthma    Back pain 09/18/2019   Cervical disc disorder with radiculopathy of cervical  region 2/99/3716   Chronic systolic CHF (congestive heart failure) ([place])    a. cMRI 4/15: EF 34% and findings - c/w NICM, normal RV size and function (RVEF 61%), Mild MR // b. Echo 2/15:  EF 30-35%, diff HK, ant-sept AK, Gr 2 DD, mild MR, trivial TR  //  c. Echo 5/17: EF 20-25%, severe diffuse HK, marked systolic dyssynchrony, grade 1 diastolic dysfunction, mild MR  //  d. [place] 5/17: Fick CO 2.9, RVSP 19, PASP 15, PW mean 2, low filing pressures and preserved CO    Cognitive impairment 10/19/2017   MOCA was administered with a score of 23/30   Diabetes mellitus    Diabetic peripheral neuropathy ([place]) 12/09/2011   Dyspnea 09/19/2012   Flu 10/17/2017   Gastritis    History of echocardiogram    Echo 6/18: EF 30-35, diffuse HK, grade 1 diastolic dysfunction, trivial MR, mild LAE, mild TR, no pericardial effusion   History of nuclear stress test    Myoview 5/18: EF 49, no ischemia, inferoseptal defect c/w LBBB artifact (intermediate risk due to EF < 50).   HTN (hypertension)    Hyperlipidemia    Hyperlipidemia associated with type 2 diabetes mellitus ([place]) 03/05/2019  Hypertension associated with diabetes ([place]) 09/28/2006   Qualifier: Diagnosis of  By: [name]     Major depression 03/31/2013   Melena 08/21/2020   NICM (nonischemic cardiomyopathy) ([place])    a. Nuclear 5/13: Normal stress nuclear study. LV Ejection Fraction: 58%  //  b. LHC 10/14: Minor luminal irregularity in prox LAD, EF 35%    Parkinson disease    Plantar fasciitis    Plantar fasciitis, bilateral 06/15/2012   Rosacea 05/29/2009   Qualifier: Diagnosis of  By: [name] MD, [name] hearing loss (SNHL), bilateral 01/04/2017   Sleep apnea    was retested and no longer had it and so d/c CPAP   Syncope    Thoracic or lumbosacral neuritis or radiculitis, unspecified 07/03/2013   Tinnitus, bilateral 01/04/2017   Type II diabetes mellitus with complication ([place]) 5/99/3570   Diabetic [facility] done by [facility]  Ophthalmology: Dr. Prudencio Burly on 11/08/13: no diabetic retinopathy. Repeat in 1 year     Urticaria    Current Medications: Current Meds  Medication Sig   acetaminophen (TYLENOL) 500 MG tablet Take 2 tablets (1,000 mg total) by mouth every 8 (eight) hours as needed.   APPLE CIDER VINEGAR PO Take 1 capsule by mouth daily.   azithromycin (ZITHROMAX) 250 MG tablet Take 1 tablet (250 mg total) by mouth daily. Take first 2 tablets together, then 1 every day until finished.   Biotin 5000 MCG CAPS Take 1 tablet by mouth daily.    budesonide-formoterol (SYMBICORT) 160-4.5 MCG/ACT inhaler Inhale 2 puffs into the lungs 2 (two) times daily.   carbidopa-levodopa (SINEMET IR) 25-100 MG tablet 2 tablets at 7am, 2 at 11am, 1 tablet at 4pm   diclofenac Sodium (VOLTAREN) 1 % GEL APPLY 4 GRAMS TOPICALLY FOUR TIMES DAILY   estradiol (ESTRACE) 0.1 MG/GM vaginal cream every other day.   famotidine (PEPCID) 20 MG tablet TAKE 1 TABLET TWICE DAILY   fluticasone (FLONASE SENSIMIST) 27.5 MCG/SPRAY nasal spray Place 2 sprays into the nose daily.   fluticasone (FLONASE) 50 MCG/ACT nasal spray Place 2 sprays into both nostrils daily.   furosemide (LASIX) 20 MG tablet Take 1 tablet (20 mg total) by mouth daily.   gabapentin (NEURONTIN) 100 MG capsule Take 100 mg by mouth daily.   glipiZIDE (GLUCOTROL) 5 MG tablet Take 0.5 tablets (2.5 mg total) by mouth daily before breakfast.   levalbuterol (XOPENEX) 0.63 MG/3ML nebulizer solution USE 1 VIAL IN NEBULIZER EVERY 4 TO 6 HOURS AS NEEDED FOR WHEEZING AND FOR SHORTNESS OF BREATH   meclizine (ANTIVERT) 25 MG tablet Take 1 tablet (25 mg total) by mouth 3 (three) times daily as needed for dizziness.   melatonin 5 MG TABS Take 5 mg by mouth at bedtime.   metFORMIN (GLUCOPHAGE) 500 MG tablet Take 1 tablet (500 mg total) by mouth 2 (two) times daily with a meal.   metoprolol succinate (TOPROL-XL) 50 MG 24 hr tablet Take 1 tablet (50 mg total) by mouth 2 (two) times daily.   montelukast  (SINGULAIR) 10 MG tablet Take 1 tablet (10 mg total) by mouth at bedtime.   OXYGEN Inhale into the lungs at bedtime. Inhale 2L into lungs   potassium chloride (KLOR-CON) 10 MEQ tablet Take 1 tablet (10 mEq total) by mouth daily.   rosuvastatin (CRESTOR) 10 MG tablet Take 1 tablet (10 mg total) by mouth as directed. Three times a week   tiZANidine (ZANAFLEX) 2 MG tablet TAKE 1 TABLET BY MOUTH EVERY 8 HOURS  AS NEEDED FOR MUSCLE SPASM   Ubiquinone (ULTRA COQ10 PO) Take 1 tablet by mouth 3 (three) times a week.   [DISCONTINUED] spironolactone (ALDACTONE) 25 MG tablet TAKE 1/2 TABLET EVERY DAY    Allergies:   Aspirin, Mirtazapine, Penicillins, Prempro [conj estrog-medroxyprogest ace], Ace inhibitors, and Simvastatin   Social History   Occupational History   Occupation: retired    Comment: CNA  Tobacco Use   Smoking status: Never    Passive exposure: Past   Smokeless tobacco: Never   Tobacco comments:    exposed to smoke during child hood (parents)  Vaping Use   Vaping Use: Never used  Substance and Sexual Activity   Alcohol use: No    Alcohol/week: 0.0 standard drinks of alcohol   Drug use: No   Sexual activity: Not on file    Family Hx: The patient's family history includes Coronary artery disease in her father and mother; Diabetes in her father, mother, sister, sister, and sister; Diabetes (age of onset: 49) in her son; Healthy in her daughter; Heart attack in her father and mother; Heart disease in her sister, sister, and sister; Hepatitis C in her sister; Parkinson's disease in her sister. There is no history of Breast cancer.  Review of Systems  Gastrointestinal:  Negative for melena.  Genitourinary:  Negative for hematuria.     EKGs/Labs/Other Test Reviewed:    EKG:  EKG is not ordered today.     Recent Labs: 01/06/2022: TSH 1.99 03/19/2022: BUN 29; Creatinine, Ser 1.24; Hemoglobin 12.6; Platelets 207; Potassium 4.5; Sodium 138   Recent Lipid Panel Recent Labs     11/15/21 0915  CHOL 171  TRIG 120  HDL 53  LDLCALC 97      Risk Assessment/Calculations/Metrics:              Physical Exam:    VS:  BP 100/60   Pulse 78   Ht 5' 1.5" (1.562 m)   Wt 119 lb 3.2 oz (54.1 kg)   LMP 08/01/2000 (Approximate)   SpO2 98%   BMI 22.16 kg/m     Wt Readings from Last 3 Encounters:  06/28/22 119 lb 3.2 oz (54.1 kg)  06/21/22 119 lb (54 kg)  03/29/22 120 lb 12.8 oz (54.8 kg)    Constitutional:      Appearance: Healthy appearance. Not in distress.  Pulmonary:     Effort: Pulmonary effort is normal.     Breath sounds: No wheezing. No rales.  Cardiovascular:     Tachycardia present. Regular rhythm. Normal S1. Normal S2.      Murmurs: There is no murmur.  Edema:    Peripheral edema absent.  Abdominal:     Palpations: Abdomen is soft.  Musculoskeletal:     Cervical back: Neck supple. Skin:    General: Skin is warm and dry.  Neurological:     Mental Status: Alert and oriented to person, place and time.     Motor: Tremor present.          ASSESSMENT & PLAN:   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) EF 40-45 by echocardiogram in March 2022.  Volume status currently stable.  She is followed in the [facility].  Recent thoracic impedance measurement was normal.  Her weights have been stable.  She is having increasing difficulty tolerating heart failure medications due to low blood pressure.  Since last seen, she has been taken off of losartan.  I think it is reasonable to stop her spironolactone.  She has had elevated heart rates in the past.  I would like to try to keep her on her current dose of metoprolol if possible. DC spironolactone Continue Lasix 20 mg daily, metoprolol succinate 50 mg twice daily Follow-up BMET today Patient knows to contact me if her weight, swelling increases Arrange follow-up ICM clinic visit Follow-up 6 months  Chronic kidney disease, stage 3a ([place]) Obtain f/u BMET today.   Type II diabetes mellitus with  complication (HCC) SGLT2 inhibitor is a consideration for CHF management. These were too expensive for her in the past. She is doing well on her current regimen. I do not think she could tolerate the addition of a SGLT2i.   ICD (implantable cardioverter-defibrillator), dual, in situ F/u with EP as planned.            Dispo:  Return in about 6 months (around 12/27/2022) for Routine Follow Up, w/ Dr. Burt Knack.   Medication Adjustments/Labs and Tests Ordered: Current medicines are reviewed at length with the patient today.  Concerns regarding medicines are outlined above.  Tests Ordered: Orders Placed This Encounter  Procedures   Basic metabolic panel   Medication Changes: No orders of the defined types were placed in this encounter.  Signed, Richardson Dopp, PA-C  06/28/2022 12:28 PM    [address], [place], [place]  [address] Phone: [phone]; Fax: [phone]

## 2022-06-28 ENCOUNTER — Encounter: Payer: Self-pay | Admitting: Physician Assistant

## 2022-06-28 ENCOUNTER — Ambulatory Visit: Payer: Medicare Other | Attending: Physician Assistant | Admitting: Physician Assistant

## 2022-06-28 VITALS — BP 100/60 | HR 78 | Ht 61.5 in | Wt 119.2 lb

## 2022-06-28 DIAGNOSIS — N1831 Chronic kidney disease, stage 3a: Secondary | ICD-10-CM

## 2022-06-28 DIAGNOSIS — I5020 Unspecified systolic (congestive) heart failure: Secondary | ICD-10-CM | POA: Diagnosis not present

## 2022-06-28 DIAGNOSIS — E118 Type 2 diabetes mellitus with unspecified complications: Secondary | ICD-10-CM | POA: Diagnosis not present

## 2022-06-28 DIAGNOSIS — Z95810 Presence of automatic (implantable) cardiac defibrillator: Secondary | ICD-10-CM

## 2022-06-28 LAB — BASIC METABOLIC PANEL
BUN/Creatinine Ratio: 15 (ref 12–28)
BUN: 18 mg/dL (ref 8–27)
CO2: 24 mmol/L (ref 20–29)
Calcium: 10.3 mg/dL (ref 8.7–10.3)
Chloride: 101 mmol/L (ref 96–106)
Creatinine, Ser: 1.18 mg/dL — ABNORMAL HIGH (ref 0.57–1.00)
Glucose: 156 mg/dL — ABNORMAL HIGH (ref 70–99)
Potassium: 4.1 mmol/L (ref 3.5–5.2)
Sodium: 142 mmol/L (ref 134–144)
eGFR: 49 mL/min/{1.73_m2} — ABNORMAL LOW (ref >59)

## 2022-06-28 NOTE — Assessment & Plan Note (Signed)
Obtain f/u BMET today.

## 2022-06-28 NOTE — Assessment & Plan Note (Signed)
SGLT2 inhibitor is a consideration for CHF management. These were too expensive for her in the past. She is doing well on her current regimen. I do not think she could tolerate the addition of a SGLT2i.

## 2022-06-28 NOTE — Patient Instructions (Signed)
Medication Instructions:  Your physician has recommended you make the following change in your medication:   STOP Spironolactone   *If you need a refill on your cardiac medications before your next appointment, please call your pharmacy*   Lab Work: TODAY:  BMET  If you have labs (blood work) drawn today and your tests are completely normal, you will receive your results only by: [place] (if you have MyChart) OR A paper copy in the mail If you have any lab test that is abnormal or we need to change your treatment, we will call you to review the results.   Testing/Procedures: None ordered   Follow-Up: At [facility], you and your health needs are our priority.  As part of our continuing mission to provide you with exceptional heart care, we have created designated Provider Care Teams.  These Care Teams include your primary Cardiologist (physician) and Advanced Practice Providers (APPs -  Physician Assistants and Nurse Practitioners) who all work together to provide you with the care you need, when you need it.  We recommend signing up for the patient portal called "MyChart".  Sign up information is provided on this After Visit Summary.  MyChart is used to connect with patients for Virtual Visits (Telemedicine).  Patients are able to view lab/test results, encounter notes, upcoming appointments, etc.  Non-urgent messages can be sent to your provider as well.   To learn more about what you can do with MyChart, go to [email or web address].    Your next appointment:   12/19/22 arrive at 2:00  The format for your next appointment:   In Person  Provider:   Sherren Mocha, MD     Other Instructions Call if your weight increases or you have worsening shortness of breath since we are stopping the Spironolactone.  Important Information About Sugar

## 2022-06-28 NOTE — Assessment & Plan Note (Signed)
F/u with EP as planned.

## 2022-06-28 NOTE — Assessment & Plan Note (Signed)
EF 40-45 by echocardiogram in March 2022.  Volume status currently stable.  She is followed in the [facility] clinic.  Recent thoracic impedance measurement was normal.  Her weights have been stable.  She is having increasing difficulty tolerating heart failure medications due to low blood pressure.  Since last seen, she has been taken off of losartan.  I think it is reasonable to stop her spironolactone.  She has had elevated heart rates in the past.  I would like to try to keep her on her current dose of metoprolol if possible. DC spironolactone Continue Lasix 20 mg daily, metoprolol succinate 50 mg twice daily Follow-up BMET today Patient knows to contact me if her weight, swelling increases Arrange follow-up ICM clinic visit Follow-up 6 months

## 2022-07-04 ENCOUNTER — Ambulatory Visit (INDEPENDENT_AMBULATORY_CARE_PROVIDER_SITE_OTHER): Payer: Medicare Other | Admitting: Podiatry

## 2022-07-04 DIAGNOSIS — L603 Nail dystrophy: Secondary | ICD-10-CM | POA: Diagnosis not present

## 2022-07-04 DIAGNOSIS — B351 Tinea unguium: Secondary | ICD-10-CM | POA: Diagnosis not present

## 2022-07-04 MED ORDER — CICLOPIROX 8 % EX SOLN: Freq: Every day | CUTANEOUS | 0 refills | Status: DC

## 2022-07-04 NOTE — Progress Notes (Signed)
  Subjective:  Patient ID: Veronica Shaw, female    DOB: Dec 07, 1950,  MRN: [id number]  Chief Complaint  Patient presents with   Nail Problem    diabetic with nails coming off on left foot    71 y.o. female presents with the above complaint. History confirmed with patient.  She thinks part of the nail had come off, is she also notes that it is loosening and discolored on the right great toe  Objective:  Physical Exam: warm, good capillary refill, no trophic changes or ulcerative lesions, normal DP and PT pulses, and abnormal sensory exam with peripheral neuropathy, she has onychomycosis with onycholysis and yellow discoloration on the right hallux distal nail plate.  Assessment:   1. Nail dystrophy   2. Onychomycosis      Plan:  Patient was evaluated and treated and all questions answered.  Discussed with her she likely has onychomycosis that is superficial in nature and affecting only one nail currently.  I recommended treatment with ciclopirox and Rx was sent to pharmacy.  Nail culture of the nail plate was taken.  She will return as needed if this worsens or does not improve  Return if symptoms worsen or fail to improve.

## 2022-07-05 ENCOUNTER — Ambulatory Visit: Payer: Self-pay

## 2022-07-07 ENCOUNTER — Ambulatory Visit (INDEPENDENT_AMBULATORY_CARE_PROVIDER_SITE_OTHER): Payer: Medicare Other | Admitting: Internal Medicine

## 2022-07-07 ENCOUNTER — Encounter: Payer: Self-pay | Admitting: Internal Medicine

## 2022-07-07 VITALS — BP 110/72 | HR 78 | Ht 61.5 in | Wt 117.0 lb

## 2022-07-07 DIAGNOSIS — E1122 Type 2 diabetes mellitus with diabetic chronic kidney disease: Secondary | ICD-10-CM

## 2022-07-07 DIAGNOSIS — N1831 Chronic kidney disease, stage 3a: Secondary | ICD-10-CM | POA: Diagnosis not present

## 2022-07-07 DIAGNOSIS — E785 Hyperlipidemia, unspecified: Secondary | ICD-10-CM

## 2022-07-07 DIAGNOSIS — E118 Type 2 diabetes mellitus with unspecified complications: Secondary | ICD-10-CM | POA: Diagnosis not present

## 2022-07-07 DIAGNOSIS — E1142 Type 2 diabetes mellitus with diabetic polyneuropathy: Secondary | ICD-10-CM | POA: Diagnosis not present

## 2022-07-07 DIAGNOSIS — Z794 Long term (current) use of insulin: Secondary | ICD-10-CM

## 2022-07-07 LAB — POCT GLUCOSE (DEVICE FOR HOME USE): POC Glucose: 155 mg/dl — AB (ref 70–99)

## 2022-07-07 LAB — POCT GLYCOSYLATED HEMOGLOBIN (HGB A1C): Hemoglobin A1C: 5.7 % — AB (ref 4.0–5.6)

## 2022-07-07 MED ORDER — METFORMIN HCL 500 MG PO TABS: 500.0000 mg | ORAL_TABLET | Freq: Every day | ORAL | 3 refills | Status: DC

## 2022-07-07 MED ORDER — ROSUVASTATIN CALCIUM 10 MG PO TABS: 10.0000 mg | ORAL_TABLET | ORAL | 3 refills | Status: DC

## 2022-07-07 NOTE — Patient Instructions (Signed)
Visit Information  Thank you for taking time to visit with me today. Please don't hesitate to contact me if I can be of assistance to you.   Following are the goals we discussed today:   Goals Addressed             This Visit's Progress    Patient to manage and monitor signs and symptoms of Heart Failure       Care Coordination Interventions:  Provided education on low sodium diet Reviewed Heart Failure Action Plan in depth and provided  Discussed importance of daily weight and advised patient to weigh and record daily Discussed the importance of keeping all appointments with provider Active listening / Reflection utilized  Emotional Support Provided Problem [place] strategies reviewed Dicussed fall prevetion Use walker        Our next appointment is by telephone on 08/09/22 at 130 pm  Please call the care guide team at [phone] if you need to cancel or reschedule your appointment.   If you are experiencing a Mental Health or [place] or need someone to talk to, please call 1-800-273-TALK (toll free, 24 hour hotline)  Patient verbalizes understanding of instructions and care plan provided today and agrees to view in [place]. Active MyChart status and patient understanding of how to access instructions and care plan via MyChart confirmed with patient.     Lazaro Arms RN, BSN, [facility] [facility]   Phone: [phone]

## 2022-07-07 NOTE — Patient Instructions (Addendum)
-   STOP Glipizide  -  Continue Metformin 500 mg , 1 tablet daily but your sugar start to stay above 150, please INCREASE Metformin back to two tablets daily and let us know       HOW TO TREAT LOW BLOOD SUGARS (Blood sugar LESS THAN 70 MG/DL) Please follow the RULE OF 15 for the treatment of hypoglycemia treatment (when your (blood sugars are less than 70 mg/dL)   STEP 1: Take 15 grams of carbohydrates when your blood sugar is low, which includes:  3-4 GLUCOSE TABS  OR 3-4 OZ OF JUICE OR REGULAR SODA OR ONE TUBE OF GLUCOSE GEL    STEP 2: RECHECK blood sugar in 15 MINUTES STEP 3: If your blood sugar is still low at the 15 minute recheck --> then, go back to STEP 1 and treat AGAIN with another 15 grams of carbohydrates.

## 2022-07-07 NOTE — Progress Notes (Signed)
Name: Veronica Shaw  MRN/ DOB: [id number], 1950/08/05   Age/ Sex: 71 y.o., female    PCP: Gerrit Heck, MD   Reason for Endocrinology Evaluation: Type 2 Diabetes Mellitus     Date of Initial Endocrinology Visit: 09/06/2021    PATIENT IDENTIFIER: [name] is a 71 y.o. female with a past medical history of T2Dm, NICM, Hx of CHF, Asthma, parkinson's disease. The patient presented for initial endocrinology clinic visit on 09/06/2021 for consultative assistance with her diabetes management.    HPI: [name] was    Diagnosed with DM >15 yrs  Prior Medications tried/Intolerance: N/A    Hemoglobin A1c has ranged from 6.6% in 2020, peaking at 7.6% in 2022.  Follows with Dr. Estanislado Pandy for OA  Follows with Dr. Vaughan Browner for Asthma  Follows with Dr. Carles Collet for Parkinson's disease   On her initial visit to our clinic her A1c was 7.4% . Started Glipizide, continued Metformin  Jardiance was cost prohibitive 09/2021  SUBJECTIVE:   During the last visit (01/06/2022): A1c 6.5%     Today (07/07/22): [name] is here for a follow up on diabetes management.   She checks her blood sugars occasionally. She denies any hypoglycemic    She was seen by podiatry on 12//2023 She had a follow-up with cardiology 06/20/2022 She was seen for Parkinson's disease by neurology on 06/21/2022  Has noted decrease appetite     HOME DIABETES REGIMEN: Metformin 500 mg , 1 tablet Twice a day - takes 1 tablet daily  Glipizide 5 mg , half a tablet before Breakfast     Statin: yes ACE-I/ARB: yes    METER DOWNLOAD SUMMARY: unable to download 30 day average 130    DIABETIC COMPLICATIONS: Microvascular complications:  CKD III, neuropathy  Denies: retinopathy  Last eye exam: Completed 2022  Macrovascular complications:   Denies: CAD, PVD, CVA   PAST HISTORY: Past Medical History:  Past Medical History:  Diagnosis Date   Acute on chronic systolic CHF (congestive heart  failure), NYHA class 4 ([place]) 10/19/2020   Asthma    Back pain 09/18/2019   Cervical disc disorder with radiculopathy of cervical region 9/89/2119   Chronic systolic CHF (congestive heart failure) ([place])    a. cMRI 4/15: EF 34% and findings - c/w NICM, normal RV size and function (RVEF 61%), Mild MR // b. Echo 2/15:  EF 30-35%, diff HK, ant-sept AK, Gr 2 DD, mild MR, trivial TR  //  c. Echo 5/17: EF 20-25%, severe diffuse HK, marked systolic dyssynchrony, grade 1 diastolic dysfunction, mild MR  //  d. [facility] 5/17: Fick CO 2.9, RVSP 19, PASP 15, PW mean 2, low filing pressures and preserved CO    Cognitive impairment 10/19/2017   MOCA was administered with a score of 23/30   Diabetes mellitus    Diabetic peripheral neuropathy ([place]) 12/09/2011   Dyspnea 09/19/2012   Flu 10/17/2017   Gastritis    History of echocardiogram    Echo 6/18: EF 30-35, diffuse HK, grade 1 diastolic dysfunction, trivial MR, mild LAE, mild TR, no pericardial effusion   History of nuclear stress test    Myoview 5/18: EF 49, no ischemia, inferoseptal defect c/w LBBB artifact (intermediate risk due to EF < 50).   HTN (hypertension)    Hyperlipidemia    Hyperlipidemia associated with type 2 diabetes mellitus ([place]) 03/05/2019   Hypertension associated with diabetes ([place]) 09/28/2006   Qualifier: Diagnosis of  By: [name]  Major depression 03/31/2013   Melena 08/21/2020   NICM (nonischemic cardiomyopathy) ([place])    a. Nuclear 5/13: Normal stress nuclear study. LV Ejection Fraction: 58%  //  b. LHC 10/14: Minor luminal irregularity in prox LAD, EF 35%    Parkinson disease    Plantar fasciitis    Plantar fasciitis, bilateral 06/15/2012   Rosacea 05/29/2009   Qualifier: Diagnosis of  By: [name] MD, [name] hearing loss (SNHL), bilateral 01/04/2017   Sleep apnea    was retested and no longer had it and so d/c CPAP   Syncope    Thoracic or lumbosacral neuritis or radiculitis, unspecified 07/03/2013   Tinnitus,  bilateral 01/04/2017   Type II diabetes mellitus with complication ([place]) 2/94/7654   Diabetic eye exam done by [facility]mology: Dr. Prudencio Burly on 11/08/13: no diabetic retinopathy. Repeat in 1 year     Urticaria    Past Surgical History:  Past Surgical History:  Procedure Laterality Date   BREAST EXCISIONAL BIOPSY Left 1970   benign cyst   BREAST SURGERY Left    BUNIONECTOMY     CARDIAC CATHETERIZATION N/A 12/16/2015   Procedure: Right Heart Cath;  Surgeon: Sherren Mocha, MD;  Location: [place] CV LAB;  Service: Cardiovascular;  Laterality: N/A;   CHOLECYSTECTOMY     eye lid surgery Bilateral 05/09/2019   IMPLANTABLE CARDIOVERTER DEFIBRILLATOR IMPLANT  11-25-13   MDT dual chamber ICD implanted by [name] for primary prevention   IMPLANTABLE CARDIOVERTER DEFIBRILLATOR IMPLANT N/A 11/25/2013   Procedure: IMPLANTABLE CARDIOVERTER DEFIBRILLATOR IMPLANT;  Surgeon: [name], MD;  Location: [facility] C[facility] LAB;  Service: Cardiovascular;  Laterality: N/A;   LEFT AND RIGHT HEART CATHETERIZATION WITH CORONARY ANGIOGRAM N/A 05/31/2013   Procedure: LEFT AND RIGHT HEART CATHETERIZATION WITH CORONARY ANGIOGRAM;  Surgeon: [name], MD;  Location: [facility] CATH LAB;  Service: Cardiovascular;  Laterality: N/A;   TONSILLECTOMY     TUBAL LIGATION      Social History:  reports that she has never smoked. She has been exposed to tobacco smoke. She has never used smokeless tobacco. She reports that she does not drink alcohol and does not use drugs. Family History:  Family History  Problem Relation Age of Onset   Coronary artery disease Father        Died age 8   Heart attack Father    Diabetes Father    Coronary artery disease Mother        Died age 106   Heart attack Mother    Diabetes Mother    Parkinson's disease Sister    Heart disease Sister    Hepatitis C Sister    Diabetes Sister    Diabetes Son 68       T1DM   Healthy Daughter    Diabetes Sister    Heart disease Sister    Diabetes  Sister    Heart disease Sister    Breast cancer Neg Hx      HOME MEDICATIONS: Allergies as of 07/07/2022       Reactions   Aspirin Swelling, Other (See Comments)   Other reaction(s): Other (See Comments) Causes nose bleeds *ONLY THE COATED ASA* Causes nose bleeds *ONLY THE COATED ASA*   Mirtazapine Other (See Comments)   Hulucinations, sucidal thoughts   Penicillins Shortness Of Breath, Rash   Shortness of Breath - Throat felt like it was closing.    Prempro [conj Estrog-medroxyprogest Ace] Shortness Of Breath   Throat swelling Throat swelling  Ace Inhibitors Cough   Simvastatin Other (See Comments), Rash   Muscle aches        Medication List        Accurate as of July 07, 2022  9:45 AM. If you have any questions, ask your nurse or doctor.          acetaminophen 500 MG tablet Commonly known as: TYLENOL Take 2 tablets (1,000 mg total) by mouth every 8 (eight) hours as needed.   APPLE CIDER VINEGAR PO Take 1 capsule by mouth daily.   azithromycin 250 MG tablet Commonly known as: ZITHROMAX Take 1 tablet (250 mg total) by mouth daily. Take first 2 tablets together, then 1 every day until finished.   Biotin 5000 MCG Caps Take 1 tablet by mouth daily.   budesonide-formoterol 160-4.5 MCG/ACT inhaler Commonly known as: Symbicort Inhale 2 puffs into the lungs 2 (two) times daily.   carbidopa-levodopa 25-100 MG tablet Commonly known as: SINEMET IR 2 tablets at 7am, 2 at 11am, 1 tablet at 4pm   ciclopirox 8 % solution Commonly known as: Penlac Apply topically at bedtime. Apply over nail and surrounding skin. Apply daily over previous coat. After seven (7) days, may remove with alcohol and continue cycle.   diclofenac Sodium 1 % Gel Commonly known as: VOLTAREN APPLY 4 GRAMS TOPICALLY FOUR TIMES DAILY   estradiol 0.1 MG/GM vaginal cream Commonly known as: ESTRACE every other day.   famotidine 20 MG tablet Commonly known as: PEPCID TAKE 1 TABLET TWICE  DAILY   Flonase Sensimist 27.5 MCG/SPRAY nasal spray Generic drug: fluticasone Place 2 sprays into the nose daily.   fluticasone 50 MCG/ACT nasal spray Commonly known as: FLONASE Place 2 sprays into both nostrils daily.   furosemide 20 MG tablet Commonly known as: LASIX Take 1 tablet (20 mg total) by mouth daily.   gabapentin 100 MG capsule Commonly known as: NEURONTIN Take 100 mg by mouth daily.   glipiZIDE 5 MG tablet Commonly known as: GLUCOTROL Take 0.5 tablets (2.5 mg total) by mouth daily before breakfast.   levalbuterol 0.63 MG/3ML nebulizer solution Commonly known as: XOPENEX USE 1 VIAL IN NEBULIZER EVERY 4 TO 6 HOURS AS NEEDED FOR WHEEZING AND FOR SHORTNESS OF BREATH   meclizine 25 MG tablet Commonly known as: ANTIVERT Take 1 tablet (25 mg total) by mouth 3 (three) times daily as needed for dizziness.   melatonin 5 MG Tabs Take 5 mg by mouth at bedtime.   metFORMIN 500 MG tablet Commonly known as: GLUCOPHAGE Take 1 tablet (500 mg total) by mouth 2 (two) times daily with a meal. What changed: when to take this   metoprolol succinate 50 MG 24 hr tablet Commonly known as: TOPROL-XL Take 1 tablet (50 mg total) by mouth 2 (two) times daily.   montelukast 10 MG tablet Commonly known as: SINGULAIR Take 1 tablet (10 mg total) by mouth at bedtime.   OXYGEN Inhale into the lungs at bedtime. Inhale 2L into lungs   potassium chloride 10 MEQ tablet Commonly known as: KLOR-CON Take 1 tablet (10 mEq total) by mouth daily.   rosuvastatin 10 MG tablet Commonly known as: CRESTOR Take 1 tablet (10 mg total) by mouth as directed. Three times a week   tiZANidine 2 MG tablet Commonly known as: ZANAFLEX TAKE 1 TABLET BY MOUTH EVERY 8 HOURS AS NEEDED FOR MUSCLE SPASM   ULTRA COQ10 PO Take 1 tablet by mouth 3 (three) times a week.  ALLERGIES: Allergies  Allergen Reactions   Aspirin Swelling and Other (See Comments)    Other reaction(s): Other (See  Comments) Causes nose bleeds *ONLY THE COATED ASA* Causes nose bleeds *ONLY THE COATED ASA*   Mirtazapine Other (See Comments)    Hulucinations, sucidal thoughts   Penicillins Shortness Of Breath and Rash    Shortness of Breath - Throat felt like it was closing.    Prempro [Conj Estrog-Medroxyprogest Ace] Shortness Of Breath    Throat swelling Throat swelling   Ace Inhibitors Cough   Simvastatin Other (See Comments) and Rash    Muscle aches     REVIEW OF SYSTEMS: A comprehensive ROS was conducted with the patient and is negative except as per HPI    OBJECTIVE:   VITAL SIGNS: BP 110/72 (BP Location: Left Arm, Patient Position: Sitting, Cuff Size: Small)   Pulse 78   Ht 5' 1.5" (1.562 m)   Wt 117 lb (53.1 kg)   LMP 08/01/2000 (Approximate)   SpO2 95%   BMI 21.75 kg/m    PHYSICAL EXAM:  General: Pt appears well and is in NAD  Neck: General: Supple without adenopathy or carotid bruits. Thyroid: Thyroid size normal.  No goiter or nodules appreciated.   Lungs: Clear with good BS bilat with no rales, rhonchi, or wheezes  Heart: RRR with normal S1 and S2 and no gallops; no murmurs; no rub  Extremities:  Lower extremities - No pretibial edema. No lesions.  Neuro: MS is good with appropriate affect, pt is alert and Ox3      DATA REVIEWED:  Lab Results  Component Value Date   HGBA1C 5.7 (A) 07/07/2022   HGBA1C 6.5 (H) 11/15/2021   HGBA1C 7.4 (A) 09/06/2021    Latest Reference Range & Units 06/28/22 10:02  Sodium 134 - 144 mmol/L 142  Potassium 3.5 - 5.2 mmol/L 4.1  Chloride 96 - 106 mmol/L 101  CO2 20 - 29 mmol/L 24  Glucose 70 - 99 mg/dL 156 (H)  BUN 8 - 27 mg/dL 18  Creatinine 0.57 - 1.00 mg/dL 1.18 (H)  Calcium 8.7 - 10.3 mg/dL 10.3  BUN/Creatinine Ratio 12 - 28  15  eGFR >59 mL/min/1.73 49 (L)  (H): Data is abnormally high (L): Data is abnormally low     Latest Reference Range & Units 01/06/22 10:04  TSH 0.35 - 5.50 uIU/mL 1.99  T4,Free(Direct) 0.60 -  1.60 ng/dL 1.26    ASSESSMENT / PLAN / RECOMMENDATIONS:   1) Type 2 Diabetes Mellitus,Optimally controlled, With CKD III and neuropathic complications - Most recent A1c of 5.7 %. Goal A1c < 7.0 %.     - She has self decreased Metformin due to Bg in the high 80's  - Given her A1c 5.7 %, will stop glipizide  - She was enocuraged to continue glucose checks at home and if she noticed BG > 150 mg/dL, to increase Metformin to BID  - BMP showed improved GFR  - SGLT-2 inhibitors are cost prohibitive   MEDICATIONS:  STOP GLIPIZIDE  Continue metformin 500 mg daily  EDUCATION / INSTRUCTIONS: BG monitoring instructions: Patient is instructed to check her blood sugars 1 times a day, fasting. Call [facility] if: BG persistently < 70  I reviewed the Rule of 15 for the treatment of hypoglycemia in detail with the patient. Literature supplied.   2) Diabetic complications:  Eye: Does not have known diabetic retinopathy.  Neuro/ Feet: Does  have known diabetic peripheral neuropathy. Renal: Patient does  have known baseline CKD. She is  on an ACEI/ARB at present.   3) Dyslipidemia:   -Due to LDL being above goal I have increased her rosuvastatin to 3 times weekly in February 2023. -She is intolerance to Simvastatin  -LDL trended down    Continue  Rosuvastatin 10 mg Three times a week    Follow-up in 4 months    Signed electronically by: Mack Guise, MD  [facility]  [facility] [address], [place] [address] Phone: [phone] FAX: [phone]   CC: Gerrit Heck, [place] [address] Phone: [phone]  Fax: [phone]    Return to Endocrinology clinic as below: Future Appointments  Date Time Provider [place]  07/07/2022  9:50 AM Jashley Yellin, Melanie Crazier, MD [facility] None  07/11/2022  7:25 AM [facility] DEVICE REMOTES [facility]   07/18/2022  7:20 AM [facility] DEVICE REMOTES [facility]  08/09/2022  1:30 PM Lazaro Arms, RN [facility] None  08/25/2022  8:10 AM [facility] DEVICE REMOTES [facility]  11/24/2022  8:10 AM [facility] DEVICE REMOTES [facility]  12/19/2022  2:20 PM Sherren Mocha, MD [facility]  12/20/2022 10:45 AM Tat, Eustace Quail, DO [facility] None  02/23/2023  8:10 AM [facility] DEVICE REMOTES [facility]  05/25/2023  8:10 AM [facility] DEVICE REMOTES [facility]  08/24/2023  8:10 AM [facility] DEVICE REMOTES [facility]  11/23/2023  8:10 AM [facility] DEVICE REMOTES [facility]

## 2022-07-07 NOTE — Patient Outreach (Signed)
  Care Coordination   Follow Up Visit Note   07/05/2022 Name: Marietta Sikkema MRN: [id number] DOB: 1951-06-22  Emmelia Jesscia Imm is a 71 y.o. year old female who sees Gerrit Heck, MD for primary care. I spoke with  Terie Purser by phone today.  What matters to the patients health and wellness today?  Nickola Javier Glazier denies experiencing any chest pain or swelling, but has been experiencing shortness of breath with exertion, which is not unusual for her. She mentioned that she is still experiencing falls and recently went to her neurology appointment, where some of her medications were increased. During our discussion, I suggested the possibility of using a lightweight wheelchair to assist her.    Goals Addressed             This Visit's Progress    Patient to manage and monitor signs and symptoms of Heart Failure       Care Coordination Interventions:  Provided education on low sodium diet Reviewed Heart Failure Action Plan in depth and provided  Discussed importance of daily weight and advised patient to weigh and record daily Discussed the importance of keeping all appointments with provider Active listening / Reflection utilized  Emotional Support Provided Problem [place] strategies reviewed Dicussed fall prevetion Use walker        SDOH assessments and interventions completed:  No     Care Coordination Interventions:  Yes, provided   Follow up plan: Follow up call scheduled for +08/09/22 130 pm    Encounter Outcome:  Pt. Visit Completed    Lazaro Arms RN, BSN, [facility]   Phone: [phone]

## 2022-07-11 ENCOUNTER — Ambulatory Visit (INDEPENDENT_AMBULATORY_CARE_PROVIDER_SITE_OTHER): Payer: Medicare Other

## 2022-07-11 DIAGNOSIS — I5022 Chronic systolic (congestive) heart failure: Secondary | ICD-10-CM

## 2022-07-11 DIAGNOSIS — Z95810 Presence of automatic (implantable) cardiac defibrillator: Secondary | ICD-10-CM

## 2022-07-13 NOTE — Progress Notes (Signed)
EPIC Encounter for ICM Monitoring  Patient Name: Veronica Shaw is a 71 y.o. female Date: 07/13/2022 Primary Care Physican: Gerrit Heck, MD Primary Cardiologist: Cooper/Weaver PA Electrophysiologist: Lovena Le 05/06/2022 Weight: 119 lbs 06/17/2022 Weight: 119 lbs                                  Transmission reviewed.   OptiVol Thoracic impedance suggesting possible fluid accumulation starting 12/7 but returned close to baseline 12/11.   Also suggesting possible fluid accumulation occurred 11/26-12/3.   Prescribed:  Furosemide 20 mg Take 1 tablet (20 mg total) by mouth daily.   Potassium 10 mEq take 1 tablet (10 mEq total) by mouth daily   Labs: 03/19/2022 Creatinine 1.24, BUN 29, Potassium 4.5, Sodium 138 01/06/2022 Creatinine 1.25, BUN 24, Potassium 4.3, Sodium 140 08/20/2021 Creatinine 1.11, BUN 21, Potassium 4.3, Sodium 140, GFR 53 A complete set of results can be found in Results Review.   Recommendations:   No changes.  Copy sent to Richardson Dopp, PA following 11/28 OV.  Spironolactone stopped at OV.     Follow-up plan: ICM clinic phone appointment on 07/18/2022.   91 day device clinic remote transmission 08/25/2022.     EP/Cardiology Office Visits:     Recalls 01/17/2022 for [name] (yearly visit).  12/19/2022 with Dr Burt Knack.   Copy of ICM check sent to Dr. Lovena Le.     3 month ICM trend: 07/11/2022.    12-14 Month ICM trend:     Veronica Billings, RN 07/13/2022 8:08 AM

## 2022-07-14 ENCOUNTER — Encounter: Payer: Self-pay | Admitting: Pharmacist

## 2022-07-14 DIAGNOSIS — Z596 Low income: Secondary | ICD-10-CM

## 2022-07-14 NOTE — Progress Notes (Signed)
[facility])                                            [place] Team    07/14/2022  Shanelle Clontz [birth year] [id number]  Reason for referral: medication assistance renewal for 2024   Referral source: Patient Referral medication(s): Symbicort inhaler Current insurance:Medicare A &B ([employer])       Medication Assistance Findings:  Medication assistance needs identified: Symbicort inhaler. Unfortunately, Symbicort is no longer available through medication assistance programming.  Memory Dance is available through [place] program but it requires patients to spend at least $600 in out of pocket medication expenses each year for approval.  Although it is a therapy esclation vs comprable therapy, Breztri (budesonide, glycopyrrolate, and formoterol) is available through AZ&Me 's program.        If deemed therapeutically appropriate, patient could be switched to [place].  A note will be sent to the patient's provider for the final decision.   Additional medication assistance options reviewed with patient as warranted:  No other options identified   Plan: I will route note to patient's provider for a decision on Breztri.  Elayne Guerin, PharmD, [place] Clinical Pharmacist [phone]

## 2022-07-18 MED ORDER — FUROSEMIDE 20 MG PO TABS: ORAL_TABLET | ORAL | 1 refills | Status: DC

## 2022-07-18 NOTE — Progress Notes (Signed)
Spoke with patient and provided Richardson Dopp, PA recommendations to   Change Lasix dosage to take 1 tablet by mouth daily (20 mg total) except take 2 tablets (40 mg total) on Monday and Thursdays. BMET in 2 weeks and scheduled for 08/02/2022 at 11:30 AM in [address] office Recheck fluid levels on 08/03/2022.    She verbalized understanding of instructions and repeated back correctly.    She needs a refill and request to be sent to [facility].   My chart message sent as requested with written recommendations.    Advised to call back if she has any questions.

## 2022-07-18 NOTE — Addendum Note (Signed)
Addended by: [name] on: 07/18/2022 10:19 AM   Modules accepted: Orders

## 2022-07-18 NOTE — Progress Notes (Signed)
  Received: 4 days ago Richardson Dopp T, PA-C  Shyra Emile Panda, RN; P Cv Div [facility] Triage  Increase Lasix to:  Lasix 20 mg once daily except take 40 mg on Mondays and Thursdays.  BMET in 2 weeks  F/u ICM check in 2 weeks. Richardson Dopp, PA-C   07/14/2022 11:29 AM

## 2022-08-02 ENCOUNTER — Ambulatory Visit: Payer: Medicare Other | Attending: Internal Medicine

## 2022-08-02 DIAGNOSIS — I5022 Chronic systolic (congestive) heart failure: Secondary | ICD-10-CM

## 2022-08-02 DIAGNOSIS — Z95810 Presence of automatic (implantable) cardiac defibrillator: Secondary | ICD-10-CM

## 2022-08-02 DIAGNOSIS — E119 Type 2 diabetes mellitus without complications: Secondary | ICD-10-CM | POA: Diagnosis not present

## 2022-08-02 DIAGNOSIS — I10 Essential (primary) hypertension: Secondary | ICD-10-CM | POA: Diagnosis not present

## 2022-08-03 ENCOUNTER — Ambulatory Visit (INDEPENDENT_AMBULATORY_CARE_PROVIDER_SITE_OTHER): Payer: Medicare Other

## 2022-08-03 DIAGNOSIS — Z95810 Presence of automatic (implantable) cardiac defibrillator: Secondary | ICD-10-CM | POA: Diagnosis not present

## 2022-08-03 DIAGNOSIS — I5022 Chronic systolic (congestive) heart failure: Secondary | ICD-10-CM | POA: Diagnosis not present

## 2022-08-04 LAB — BASIC METABOLIC PANEL

## 2022-08-05 ENCOUNTER — Other Ambulatory Visit: Payer: Self-pay

## 2022-08-05 ENCOUNTER — Encounter (HOSPITAL_BASED_OUTPATIENT_CLINIC_OR_DEPARTMENT_OTHER): Payer: Self-pay

## 2022-08-05 ENCOUNTER — Emergency Department (HOSPITAL_BASED_OUTPATIENT_CLINIC_OR_DEPARTMENT_OTHER)
Admission: EM | Admit: 2022-08-05 | Discharge: 2022-08-05 | Disposition: A | Discharge status: Home / Self Care | Payer: Medicare Other | Attending: Emergency Medicine | Admitting: Emergency Medicine

## 2022-08-05 ENCOUNTER — Emergency Department (HOSPITAL_BASED_OUTPATIENT_CLINIC_OR_DEPARTMENT_OTHER): Payer: Medicare Other

## 2022-08-05 DIAGNOSIS — Y9289 Other specified places as the place of occurrence of the external cause: Secondary | ICD-10-CM | POA: Diagnosis not present

## 2022-08-05 DIAGNOSIS — Y9389 Activity, other specified: Secondary | ICD-10-CM | POA: Diagnosis not present

## 2022-08-05 DIAGNOSIS — W19XXXA Unspecified fall, initial encounter: Secondary | ICD-10-CM | POA: Insufficient documentation

## 2022-08-05 DIAGNOSIS — S0990XA Unspecified injury of head, initial encounter: Secondary | ICD-10-CM

## 2022-08-05 DIAGNOSIS — S0083XA Contusion of other part of head, initial encounter: Secondary | ICD-10-CM | POA: Diagnosis not present

## 2022-08-05 DIAGNOSIS — S0993XA Unspecified injury of face, initial encounter: Secondary | ICD-10-CM | POA: Diagnosis present

## 2022-08-05 DIAGNOSIS — Y998 Other external cause status: Secondary | ICD-10-CM | POA: Insufficient documentation

## 2022-08-05 NOTE — ED Triage Notes (Addendum)
Reports hx of parkinsons and has frequent falls.  Reports tripping and falling and hitting left side of head and face. Patient has chronic neck pain. No thinners and no LOC

## 2022-08-05 NOTE — ED Notes (Signed)
Reviewed AVS/discharge instruction with patient. Time allotted for and all questions answered. Patient is agreeable for d/c and escorted to ed exit by staff.  

## 2022-08-05 NOTE — Progress Notes (Signed)
EPIC Encounter for ICM Monitoring  Patient Name: Veronica Shaw is a 72 y.o. female Date: 08/05/2022 Primary Care Physican: Gerrit Heck, MD Primary Cardiologist: Cooper/Weaver PA Electrophysiologist: Lovena Le 05/06/2022 Weight: 119 lbs 06/17/2022 Weight: 119 lbs                                  Transmission reviewed.   OptiVol Thoracic impedance suggesting normal fluid levels since Lasix was increased to 40 mg twice a week.    Prescribed:  Furosemide 20 mg Take 1 tablet (20 mg total) by mouth daily except take 2 tablets (40 mg total) every Monday & Thursday.   Potassium 10 mEq take 1 tablet (10 mEq total) by mouth daily   Labs: 06/28/2022 Creatinine 1.18, BUN 18, Potassium 4.1, Sodium 142, GFR 49 03/19/2022 Creatinine 1.24, BUN 29, Potassium 4.5, Sodium 138 01/06/2022 Creatinine 1.25, BUN 24, Potassium 4.3, Sodium 140 08/20/2021 Creatinine 1.11, BUN 21, Potassium 4.3, Sodium 140, GFR 53 A complete set of results can be found in Results Review.   Recommendations:   No changes.    Follow-up plan: ICM clinic phone appointment on 09/05/2022.   91 day device clinic remote transmission 08/25/2022.     EP/Cardiology Office Visits:     Recalls 01/17/2022 for [name] (yearly visit).  12/19/2022 with Dr Burt Knack.   Copy of ICM check sent to Dr. Lovena Le.      3 month ICM trend: 08/02/2022.    12-14 Month ICM trend:     [name], RN 08/05/2022 4:30 PM

## 2022-08-05 NOTE — ED Provider Notes (Signed)
[facility] EMERGENCY DEPT Provider Note   CSN: [id number] Arrival date & time: 08/05/22  1207     History {Add pertinent medical, surgical, social history, OB history to HPI:1} Chief Complaint  Patient presents with   Fall    Veronica Shaw is a 72 y.o. female.  She has a history of Parkinson's and unsteady gait frequent falls.  Complaining of a mechanical fall around 4 AM this morning in her house.  She hit on left side of her face and complaining of some face and jaw pain.  She said she falls daily.  She denies any neck or back pain no chest pain or abdominal pain.  She has been ambulatory since her fall.  Not on blood thinners  The history is provided by the patient.  Fall This is a recurrent problem. The current episode started 6 to 12 hours ago. The problem occurs daily. The problem has not changed since onset.Pertinent negatives include no chest pain, no abdominal pain, no headaches and no shortness of breath. Nothing aggravates the symptoms. Nothing relieves the symptoms. She has tried rest for the symptoms. The treatment provided mild relief.       Home Medications Prior to Admission medications   Medication Sig Start Date End Date Taking? Authorizing Provider  acetaminophen (TYLENOL) 500 MG tablet Take 2 tablets (1,000 mg total) by mouth every 8 (eight) hours as needed. 01/29/21   Brimage, Ronnette Juniper, DO  APPLE CIDER VINEGAR PO Take 1 capsule by mouth daily.    [provider]  azithromycin (ZITHROMAX) 250 MG tablet Take 1 tablet (250 mg total) by mouth daily. Take first 2 tablets together, then 1 every day until finished. 03/19/22   Varney Biles, MD  Biotin 5000 MCG CAPS Take 1 tablet by mouth daily.     [provider]  budesonide-formoterol (SYMBICORT) 160-4.5 MCG/ACT inhaler Inhale 2 puffs into the lungs 2 (two) times daily. 10/24/19   Lauraine Rinne, NP  carbidopa-levodopa (SINEMET IR) 25-100 MG tablet 2 tablets at 7am, 2 at 11am, 1 tablet  at 4pm 06/21/22   Tat, Rebecca S, DO  ciclopirox (PENLAC) 8 % solution Apply topically at bedtime. Apply over nail and surrounding skin. Apply daily over previous coat. After seven (7) days, may remove with alcohol and continue cycle. 07/04/22   McDonald, Stephan Minister, DPM  diclofenac Sodium (VOLTAREN) 1 % GEL APPLY 4 GRAMS TOPICALLY FOUR TIMES DAILY 04/28/22   Gerrit Heck, MD  estradiol (ESTRACE) 0.1 MG/GM vaginal cream every other day. 10/27/21   [provider]  famotidine (PEPCID) 20 MG tablet TAKE 1 TABLET TWICE DAILY 06/17/19   Caroline More, DO  fluticasone (FLONASE SENSIMIST) 27.5 MCG/SPRAY nasal spray Place 2 sprays into the nose daily. 12/17/20   Parrett, Fonnie Mu, NP  fluticasone (FLONASE) 50 MCG/ACT nasal spray Place 2 sprays into both nostrils daily. 03/29/22   Dameron, Luna Fuse, DO  furosemide (LASIX) 20 MG tablet Take 1 tablet (20 mg total) by mouth daily except take 2 tablets (40 mg total) every Monday and Thursday. 07/18/22   Richardson Dopp T, PA-C  gabapentin (NEURONTIN) 100 MG capsule Take 100 mg by mouth daily. 09/03/21   [provider]  levalbuterol (XOPENEX) 0.63 MG/3ML nebulizer solution USE 1 VIAL IN NEBULIZER EVERY 4 TO 6 HOURS AS NEEDED FOR WHEEZING AND FOR SHORTNESS OF BREATH 10/14/20   Mannam, Hart Robinsons, MD  meclizine (ANTIVERT) 25 MG tablet Take 1 tablet (25 mg total) by mouth 3 (three) times daily as needed  for dizziness. 03/19/22   Varney Biles, MD  melatonin 5 MG TABS Take 5 mg by mouth at bedtime.    [provider]  metFORMIN (GLUCOPHAGE) 500 MG tablet Take 1 tablet (500 mg total) by mouth daily with breakfast. 07/07/22   Shamleffer, Melanie Crazier, MD  metoprolol succinate (TOPROL-XL) 50 MG 24 hr tablet Take 1 tablet (50 mg total) by mouth 2 (two) times daily. 07/21/21   [name], MD  montelukast (SINGULAIR) 10 MG tablet Take 1 tablet (10 mg total) by mouth at bedtime. 06/01/21   Marshell Garfinkel, MD  OXYGEN Inhale into the lungs at bedtime.  Inhale 2L into lungs    [provider]  potassium chloride (KLOR-CON) 10 MEQ tablet Take 1 tablet (10 mEq total) by mouth daily. 11/04/21   Sherren Mocha, MD  rosuvastatin (CRESTOR) 10 MG tablet Take 1 tablet (10 mg total) by mouth as directed. Three times a week 07/07/22   Shamleffer, Melanie Crazier, MD  tiZANidine (ZANAFLEX) 2 MG tablet TAKE 1 TABLET BY MOUTH EVERY 8 HOURS AS NEEDED FOR MUSCLE SPASM 03/25/21   Brimage, Ronnette Juniper, DO  Ubiquinone (ULTRA COQ10 PO) Take 1 tablet by mouth 3 (three) times a week.    [provider]      Allergies    Aspirin, Mirtazapine, Penicillins, Prempro [conj estrog-medroxyprogest ace], Ace inhibitors, and Simvastatin    Review of Systems   Review of Systems  Constitutional:  Negative for fever.  HENT:  Negative for trouble swallowing.   Eyes:  Negative for pain.  Respiratory:  Negative for shortness of breath.   Cardiovascular:  Negative for chest pain.  Gastrointestinal:  Negative for abdominal pain.  Musculoskeletal:  Negative for neck pain.  Neurological:  Negative for headaches.    Physical Exam Updated Vital Signs BP (!) 144/59 (BP Location: Right Arm)   Pulse 82   Temp 98.4 F (36.9 C)   Resp 20   Ht 5' 1.5" (1.562 m)   Wt 53.1 kg   LMP 08/01/2000 (Approximate)   SpO2 100%   BMI 21.75 kg/m  Physical Exam Vitals and nursing note reviewed.  Constitutional:      General: She is not in acute distress.    Appearance: Normal appearance. She is well-developed.  HENT:     Head: Normocephalic and atraumatic.     Comments: She has some tenderness around her left forehead and her left jaw.  No gross deformities no ecchymosis no open wounds.    Nose: Nose normal.     Mouth/Throat:     Mouth: Mucous membranes are moist.     Pharynx: Oropharynx is clear.  Eyes:     Conjunctiva/sclera: Conjunctivae normal.  Cardiovascular:     Rate and Rhythm: Normal rate and regular rhythm.     Heart sounds: No murmur heard. Pulmonary:      Effort: Pulmonary effort is normal. No respiratory distress.     Breath sounds: Normal breath sounds.  Abdominal:     Palpations: Abdomen is soft.     Tenderness: There is no abdominal tenderness. There is no guarding or rebound.  Musculoskeletal:        General: No deformity. Normal range of motion.     Cervical back: Neck supple.  Skin:    General: Skin is warm and dry.     Capillary Refill: Capillary refill takes less than 2 seconds.  Neurological:     General: No focal deficit present.     Mental Status: She is  alert.     Sensory: No sensory deficit.     Motor: No weakness.     ED Results / Procedures / Treatments   Labs (all labs ordered are listed, but only abnormal results are displayed) Labs Reviewed - No data to display  EKG None  Radiology No results found.  Procedures Procedures  {Document cardiac monitor, telemetry assessment procedure when appropriate:1}  Medications Ordered in ED Medications - No data to display  ED Course/ Medical Decision Making/ A&P                           Medical Decision Making Amount and/or Complexity of Data Reviewed Radiology: ordered. ECG/medicine tests: ordered.   This patient complains of ***; this involves an extensive number of treatment Options and is a complaint that carries with it a high risk of complications and morbidity. The differential includes ***  I ordered, reviewed and interpreted labs, which included *** I ordered medication *** and reviewed PMP when indicated. I ordered imaging studies which included *** and I independently    visualized and interpreted imaging which showed *** Additional history obtained from *** Previous records obtained and reviewed *** I consulted *** and discussed lab and imaging findings and discussed disposition.  Cardiac monitoring reviewed, *** Social determinants considered, *** Critical Interventions: ***  After the interventions stated above, I reevaluated the  patient and found *** Admission and further testing considered, ***   {Document critical care time when appropriate:1} {Document review of labs and clinical decision tools ie heart score, Chads2Vasc2 etc:1}  {Document your independent review of radiology images, and any outside records:1} {Document your discussion with family members, caretakers, and with consultants:1} {Document social determinants of health affecting pt's care:1} {Document your decision making why or why not admission, treatments were needed:1} Final Clinical Impression(s) / ED Diagnoses Final diagnoses:  None    Rx / DC Orders ED Discharge Orders     None

## 2022-08-05 NOTE — ED Notes (Signed)
Reports pain improvement after rest. Refuses need for additional intervention at this time

## 2022-08-05 NOTE — Discharge Instructions (Addendum)
You were seen in the emergency department for evaluation of injuries from a fall.  You had a CAT scan of your head face and neck.  There were no acute injuries identified although they commented upon an old spinal fracture.  Please follow-up with your primary care doctor.  Tylenol as needed for pain.  Ice to the affected areas.  Return if any worsening or concerning symptoms

## 2022-08-09 ENCOUNTER — Telehealth: Payer: Self-pay

## 2022-08-09 ENCOUNTER — Encounter: Payer: Self-pay | Admitting: Pharmacist

## 2022-08-09 ENCOUNTER — Telehealth: Payer: Self-pay | Admitting: Pulmonary Disease

## 2022-08-09 ENCOUNTER — Ambulatory Visit: Payer: Self-pay

## 2022-08-09 DIAGNOSIS — Z596 Low income: Secondary | ICD-10-CM

## 2022-08-09 NOTE — Telephone Encounter (Signed)
Received a call from Denyse Amass with [facility]. Stated that pt had been getting Symbicort but pt is now not going to be able to get the Symbicort anymore and she is wanting to know if pt might be able to be prescribed Breztri.  Dr. Vaughan Browner, please advise on this. Alwyn Ren has also done a phone enounter that she said she routed to provider.  Dr. Vaughan Browner, Alwyn Ren can either be reached at [phone] or she said that you could also reach out to her on Epic about decision in regards to [facility] and pt assistance through AZ&ME's program.

## 2022-08-09 NOTE — Progress Notes (Signed)
[facility])                                            [place] Team    08/09/2022  Veronica Shaw 1951/07/31 [id number]  Reason for referral: medication assistance renewal for 2024   Referral source: Patient Referral medication(s): Symbicort inhaler Current insurance:Medicare A &B ([employer])       Medication Assistance Findings:  Medication assistance needs identified: Symbicort inhaler. Unfortunately, Symbicort is no longer available through medication assistance programming.  Memory Dance is available through [place] program but it requires patients to spend at least $600 in out of pocket medication expenses each year for approval.  Although it is a therapy esclation vs comprable therapy, Breztri (budesonide, glycopyrrolate, and formoterol) is available through AZ&Me 's program.        If deemed therapeutically appropriate, patient could be switched to [place].  A note will be sent to the patient's provider for the final decision.   Additional medication assistance options reviewed with patient as warranted:  No other options identified   Plan: I will route note to patient's provider for a decision on Breztri.(Today's note is the second request). Left a message for the nurse at the Provider's office today as well.     Elayne Guerin, PharmD, [place] Clinical Pharmacist [phone]

## 2022-08-09 NOTE — Patient Instructions (Signed)
Visit Information  Thank you for taking time to visit with me today. Please don't hesitate to contact me if I can be of assistance to you.   Following are the goals we discussed today:   Goals Addressed             This Visit's Progress    Patient to manage and monitor signs and symptoms of Heart Failure and Falls       Care Coordination Interventions:  Provided education on low sodium diet Reviewed Heart Failure Action Plan in depth and provided  Discussed importance of daily weight and advised patient to weigh and record daily Discussed the importance of keeping all appointments with provider Active listening / Reflection utilized  Emotional Support Provided Problem [place] strategies reviewed Dicussed fall prevetion Use walker I had a conversation with [name], who informed me that she is still experiencing problems with falling. She visited [facility] on January 5th, 2024 and underwent imaging studies which included CT scans of her head, cervical spine, and max face. The results showed no acute findings but revealed an old cervical fracture. Today, we discussed fall precautions, and her daughter has bought her a lightweight wheelchair to use. She informed me of having no chest pain or swelling, but still experiences aches and pains. She is currently taking her medications and knows to call 911 in case of any symptoms in the red zone for her HF.        Our next appointment is by telephone on 2/8//24 at 11 am  Please call the care guide team at [phone] if you need to cancel or reschedule your appointment.   If you are experiencing a Mental Health or [place] or need someone to talk to, please call 1-800-273-TALK (toll free, 24 hour hotline)  Patient verbalizes understanding of instructions and care plan provided today and agrees to view in [place]. Active MyChart status and patient understanding of how to access instructions and care plan via  MyChart confirmed with patient.      Lazaro Arms RN, BSN, [place] [facility]   Phone: [phone]

## 2022-08-09 NOTE — Telephone Encounter (Signed)
        Patient  visited [facility] on 1/5    Telephone encounter attempt :  1st  A HIPAA compliant voice message was left requesting a return call.  Instructed patient to call back     [place], [place] Management  [phone] [address], [place], [place] [address] Phone: [phone] Email: [email or web address]

## 2022-08-09 NOTE — Patient Outreach (Signed)
  Care Coordination    Follow UpVisit Note   08/09/2022 Name: Veronica Shaw MRN: [id number] DOB: Apr 12, 1951  Veronica Shaw is a 72 y.o. year old female who sees Veronica Heck, MD for primary care. I spoke with  Veronica Shaw by phone today.  What matters to the patients health and wellness today?  I continue to have falls    Goals Addressed             This Visit's Progress    Patient to manage and monitor signs and symptoms of Heart Failure and Falls       Care Coordination Interventions:  Provided education on low sodium diet Reviewed Heart Failure Action Plan in depth and provided  Discussed importance of daily weight and advised patient to weigh and record daily Discussed the importance of keeping all appointments with provider Active listening / Reflection utilized  Emotional Support Provided Problem Veronica Shaw strategies reviewed Dicussed fall prevetion Use walker I had a conversation with [name], who informed me that she is still experiencing problems with falling. She visited Veronica Shaw on January 5th, 2024 and underwent imaging studies which included CT scans of her head, cervical spine, and max face. The results showed no acute findings but revealed an old cervical fracture. Today, we discussed fall precautions, and her daughter has bought her a lightweight wheelchair to use. She informed me of having no chest pain or swelling, but still experiences aches and pains. She is currently taking her medications and knows to call 911 in case of any symptoms in the red zone for her HF.        SDOH assessments and interventions completed:  No     Care Coordination Interventions:  Yes, provided   Follow up plan: Follow up call scheduled for 09/08/22 11 am    Encounter Outcome:  Pt. Visit Completed   Veronica Arms RN, BSN, [place] [facility]   Phone: [phone]

## 2022-08-10 ENCOUNTER — Telehealth: Payer: Self-pay | Admitting: *Deleted

## 2022-08-10 ENCOUNTER — Telehealth: Payer: Self-pay

## 2022-08-10 DIAGNOSIS — Z79899 Other long term (current) drug therapy: Secondary | ICD-10-CM

## 2022-08-10 MED ORDER — BREZTRI AEROSPHERE 160-9-4.8 MCG/ACT IN AERO: 2.0000 | INHALATION_SPRAY | Freq: Two times a day (BID) | RESPIRATORY_TRACT | 6 refills | Status: DC

## 2022-08-10 NOTE — Telephone Encounter (Signed)
-----   Message from Nuala Alpha, LPN sent at 5/72/6203  8:09 AM EST -----  ----- Message ----- From: Liliane Shi, PA-C Sent: 08/04/2022   2:56 PM EST To: Cv Div Ch St Triage  BMET not done due to "insufficient specimen". Please redraw. Richardson Dopp, PA-C    08/04/2022 2:55 PM

## 2022-08-10 NOTE — Telephone Encounter (Signed)
     Patient  visit on 1/5  at [facility]    Have you been able to follow up with your primary care physician? Yes   The patient was or was not able to obtain any needed medicine or equipment. Yes   Are there diet recommendations that you are having difficulty following? Na   Patient expresses understanding of discharge instructions and education provided has no other needs at this time.  Yes      [place], [facility], Care Management  [phone] [address], [place], [place] [address] Phone: [phone] Email: [email or web address]

## 2022-08-10 NOTE — Telephone Encounter (Signed)
Breztri ordered by Dr Vaughan Browner. And he sent message to Alwyn Ren himself regarding patient. Nothing further needed

## 2022-08-10 NOTE — Telephone Encounter (Addendum)
Veronica Shaw is fine for the patient I ordered for the patient and sent message to [facility] pharmacist so we can coordiate getting the new inhaler for the patient

## 2022-08-12 ENCOUNTER — Ambulatory Visit: Payer: Medicare Other | Attending: Physician Assistant

## 2022-08-12 DIAGNOSIS — Z79899 Other long term (current) drug therapy: Secondary | ICD-10-CM

## 2022-08-13 LAB — BASIC METABOLIC PANEL
BUN/Creatinine Ratio: 20 (ref 12–28)
BUN: 21 mg/dL (ref 8–27)
CO2: 23 mmol/L (ref 20–29)
Calcium: 10.3 mg/dL (ref 8.7–10.3)
Chloride: 104 mmol/L (ref 96–106)
Creatinine, Ser: 1.06 mg/dL — ABNORMAL HIGH (ref 0.57–1.00)
Glucose: 184 mg/dL — ABNORMAL HIGH (ref 70–99)
Potassium: 4.4 mmol/L (ref 3.5–5.2)
Sodium: 142 mmol/L (ref 134–144)
eGFR: 55 mL/min/{1.73_m2} — ABNORMAL LOW (ref >59)

## 2022-08-15 ENCOUNTER — Emergency Department (HOSPITAL_BASED_OUTPATIENT_CLINIC_OR_DEPARTMENT_OTHER): Payer: Medicare Other

## 2022-08-15 ENCOUNTER — Encounter (HOSPITAL_BASED_OUTPATIENT_CLINIC_OR_DEPARTMENT_OTHER): Payer: Self-pay | Admitting: Emergency Medicine

## 2022-08-15 ENCOUNTER — Emergency Department (HOSPITAL_BASED_OUTPATIENT_CLINIC_OR_DEPARTMENT_OTHER)
Admission: EM | Admit: 2022-08-15 | Discharge: 2022-08-15 | Disposition: A | Discharge status: Home / Self Care | Payer: Medicare Other | Attending: Emergency Medicine | Admitting: Emergency Medicine

## 2022-08-15 ENCOUNTER — Other Ambulatory Visit: Payer: Self-pay

## 2022-08-15 DIAGNOSIS — R296 Repeated falls: Secondary | ICD-10-CM

## 2022-08-15 DIAGNOSIS — R079 Chest pain, unspecified: Secondary | ICD-10-CM

## 2022-08-15 DIAGNOSIS — R0602 Shortness of breath: Secondary | ICD-10-CM | POA: Insufficient documentation

## 2022-08-15 DIAGNOSIS — G20C Parkinsonism, unspecified: Secondary | ICD-10-CM | POA: Insufficient documentation

## 2022-08-15 DIAGNOSIS — R0789 Other chest pain: Secondary | ICD-10-CM | POA: Diagnosis not present

## 2022-08-15 LAB — COMPREHENSIVE METABOLIC PANEL
ALT: <5 U/L (ref 0–44)
AST: 20 U/L (ref 15–41)
Albumin: 4.4 g/dL (ref 3.5–5.0)
Alkaline Phosphatase: 86 U/L (ref 38–126)
Anion gap: 9 (ref 5–15)
BUN: 15 mg/dL (ref 8–23)
CO2: 29 mmol/L (ref 22–32)
Calcium: 10.8 mg/dL — ABNORMAL HIGH (ref 8.9–10.3)
Chloride: 102 mmol/L (ref 98–111)
Creatinine, Ser: 1.18 mg/dL — ABNORMAL HIGH (ref 0.44–1.00)
GFR, Estimated: 49 mL/min — ABNORMAL LOW (ref >60)
Glucose, Bld: 150 mg/dL — ABNORMAL HIGH (ref 70–99)
Potassium: 4.1 mmol/L (ref 3.5–5.1)
Sodium: 140 mmol/L (ref 135–145)
Total Bilirubin: 0.6 mg/dL (ref 0.3–1.2)
Total Protein: 7.5 g/dL (ref 6.5–8.1)

## 2022-08-15 LAB — CBC
HCT: 41.8 % (ref 36.0–46.0)
Hemoglobin: 14 g/dL (ref 12.0–15.0)
MCH: 30.1 pg (ref 26.0–34.0)
MCHC: 33.5 g/dL (ref 30.0–36.0)
MCV: 89.9 fL (ref 80.0–100.0)
Platelets: 227 10*3/uL (ref 150–400)
RBC: 4.65 MIL/uL (ref 3.87–5.11)
RDW: 11.7 % (ref 11.5–15.5)
WBC: 7.6 10*3/uL (ref 4.0–10.5)
nRBC: 0 % (ref 0.0–0.2)

## 2022-08-15 LAB — TROPONIN I (HIGH SENSITIVITY): Troponin I (High Sensitivity): 5 ng/L (ref <18)

## 2022-08-15 MED ORDER — SODIUM CHLORIDE 0.9 % IV BOLUS
1000.0000 mL | Freq: Once | INTRAVENOUS | Status: AC
  Administered 2022-08-15: 1000 mL via INTRAVENOUS

## 2022-08-15 NOTE — ED Notes (Addendum)
To b/r by w/c by family, alert, NAD, calm, interactive.

## 2022-08-15 NOTE — ED Triage Notes (Signed)
Pt via pov from home with chest pain, sob, and frequent falls x 1 week. She reports that she falls every day and hits her head. She states she has constant headache because she keeps hitting her head. Pt states she uses a walker but is still falling. Pt alert & oriented, nad noted.

## 2022-08-15 NOTE — ED Notes (Signed)
EDP at [facility] 

## 2022-08-15 NOTE — ED Provider Notes (Signed)
[facility] [place] [facility] EMERGENCY DEPT Provider Note   CSN: [id number] Arrival date & time: 08/15/22  1208     History  Chief Complaint  Patient presents with   Chest Pain   Shortness of Breath    Rande Janiah Devinney is a 72 y.o. female.  72 yo F with a cc of chest pain and shortness of breath.  Has trouble describing this.  Frequent falls over the past couple weeks.  Think she has fallen every day.  Hx of parkinson's and has instability at baseline.     Chest Pain Associated symptoms: shortness of breath   Shortness of Breath Associated symptoms: chest pain        Home Medications Prior to Admission medications   Medication Sig Start Date End Date Taking? Authorizing Provider  acetaminophen (TYLENOL) 500 MG tablet Take 2 tablets (1,000 mg total) by mouth every 8 (eight) hours as needed. 01/29/21   Brimage, Ronnette Juniper, DO  APPLE CIDER VINEGAR PO Take 1 capsule by mouth daily.    [provider]  azithromycin (ZITHROMAX) 250 MG tablet Take 1 tablet (250 mg total) by mouth daily. Take first 2 tablets together, then 1 every day until finished. 03/19/22   Varney Biles, MD  Biotin 5000 MCG CAPS Take 1 tablet by mouth daily.     [provider]  Budeson-Glycopyrrol-Formoterol (BREZTRI AEROSPHERE) 160-9-4.8 MCG/ACT AERO Inhale 2 puffs into the lungs in the morning and at bedtime. 08/10/22 09/09/22  Marshell Garfinkel, MD  carbidopa-levodopa (SINEMET IR) 25-100 MG tablet 2 tablets at 7am, 2 at 11am, 1 tablet at 4pm 06/21/22   Tat, Rebecca S, DO  ciclopirox (PENLAC) 8 % solution Apply topically at bedtime. Apply over nail and surrounding skin. Apply daily over previous coat. After seven (7) days, may remove with alcohol and continue cycle. 07/04/22   McDonald, Stephan Minister, DPM  diclofenac Sodium (VOLTAREN) 1 % GEL APPLY 4 GRAMS TOPICALLY FOUR TIMES DAILY 04/28/22   Gerrit Heck, MD  estradiol (ESTRACE) 0.1 MG/GM vaginal cream every other day. 10/27/21   [provider]   famotidine (PEPCID) 20 MG tablet TAKE 1 TABLET TWICE DAILY 06/17/19   Caroline More, DO  fluticasone (FLONASE SENSIMIST) 27.5 MCG/SPRAY nasal spray Place 2 sprays into the nose daily. 12/17/20   Parrett, Fonnie Mu, NP  fluticasone (FLONASE) 50 MCG/ACT nasal spray Place 2 sprays into both nostrils daily. 03/29/22   Dameron, Luna Fuse, DO  furosemide (LASIX) 20 MG tablet Take 1 tablet (20 mg total) by mouth daily except take 2 tablets (40 mg total) every Monday and Thursday. 07/18/22   Richardson Dopp T, PA-C  gabapentin (NEURONTIN) 100 MG capsule Take 100 mg by mouth daily. 09/03/21   [provider]  levalbuterol (XOPENEX) 0.63 MG/3ML nebulizer solution USE 1 VIAL IN NEBULIZER EVERY 4 TO 6 HOURS AS NEEDED FOR WHEEZING AND FOR SHORTNESS OF BREATH 10/14/20   Mannam, Hart Robinsons, MD  meclizine (ANTIVERT) 25 MG tablet Take 1 tablet (25 mg total) by mouth 3 (three) times daily as needed for dizziness. 03/19/22   Varney Biles, MD  melatonin 5 MG TABS Take 5 mg by mouth at bedtime.    [provider]  metFORMIN (GLUCOPHAGE) 500 MG tablet Take 1 tablet (500 mg total) by mouth daily with breakfast. 07/07/22   Shamleffer, Melanie Crazier, MD  metoprolol succinate (TOPROL-XL) 50 MG 24 hr tablet Take 1 tablet (50 mg total) by mouth 2 (two) times daily. 07/21/21   [name], MD  montelukast (SINGULAIR) 10 MG  tablet Take 1 tablet (10 mg total) by mouth at bedtime. 06/01/21   Marshell Garfinkel, MD  OXYGEN Inhale into the lungs at bedtime. Inhale 2L into lungs    [provider]  potassium chloride (KLOR-CON) 10 MEQ tablet Take 1 tablet (10 mEq total) by mouth daily. 11/04/21   Sherren Mocha, MD  rosuvastatin (CRESTOR) 10 MG tablet Take 1 tablet (10 mg total) by mouth as directed. Three times a week 07/07/22   Shamleffer, Melanie Crazier, MD  tiZANidine (ZANAFLEX) 2 MG tablet TAKE 1 TABLET BY MOUTH EVERY 8 HOURS AS NEEDED FOR MUSCLE SPASM 03/25/21   Brimage, Ronnette Juniper, DO  Ubiquinone (ULTRA COQ10 PO)  Take 1 tablet by mouth 3 (three) times a week.    [provider]      Allergies    Aspirin, Mirtazapine, Penicillins, Prempro [conj estrog-medroxyprogest ace], Ace inhibitors, and Simvastatin    Review of Systems   Review of Systems  Respiratory:  Positive for shortness of breath.   Cardiovascular:  Positive for chest pain.    Physical Exam Updated Vital Signs BP (!) 131/48   Pulse 85   Temp 97.7 F (36.5 C) (Oral)   Resp 16   Ht 5' 1.5" (1.562 m)   Wt 53.5 kg   LMP 08/01/2000 (Approximate)   SpO2 98%   BMI 21.93 kg/m  Physical Exam Vitals and nursing note reviewed.  Constitutional:      General: She is not in acute distress.    Appearance: She is well-developed. She is not diaphoretic.  HENT:     Head: Normocephalic and atraumatic.  Eyes:     Pupils: Pupils are equal, round, and reactive to light.  Cardiovascular:     Rate and Rhythm: Normal rate and regular rhythm.     Heart sounds: No murmur heard.    No friction rub. No gallop.  Pulmonary:     Effort: Pulmonary effort is normal.     Breath sounds: No wheezing or rales.  Abdominal:     General: There is no distension.     Palpations: Abdomen is soft.     Tenderness: There is no abdominal tenderness.  Musculoskeletal:        General: No tenderness.     Cervical back: Normal range of motion and neck supple.  Skin:    General: Skin is warm and dry.  Neurological:     Mental Status: She is alert and oriented to person, place, and time.  Psychiatric:        Behavior: Behavior normal.     ED Results / Procedures / Treatments   Labs (all labs ordered are listed, but only abnormal results are displayed) Labs Reviewed  COMPREHENSIVE METABOLIC PANEL - Abnormal; Notable for the following components:      Result Value   Glucose, Bld 150 (*)    Creatinine, Ser 1.18 (*)    Calcium 10.8 (*)    GFR, Estimated 49 (*)    All other components within normal limits  CBC  URINALYSIS, ROUTINE W REFLEX  MICROSCOPIC  CBG MONITORING, ED  TROPONIN I (HIGH SENSITIVITY)    EKG EKG Interpretation  Date/Time:  Monday August 15 2022 12:23:52 EST Ventricular Rate:  83 PR Interval:  150 QRS Duration: 122 QT Interval:  418 QTC Calculation: 491 R Axis:   -25 Text Interpretation: Sinus rhythm with Premature atrial complexes Left bundle branch block Abnormal ECG No significant change since last tracing Confirmed by Deno Etienne [phone]) on 08/15/2022 12:44:07 PM  Radiology CT Head Wo Contrast  Result Date: 08/15/2022 CLINICAL DATA:  Frequent falls for 1 week EXAM: CT HEAD WITHOUT CONTRAST CT CERVICAL SPINE WITHOUT CONTRAST TECHNIQUE: Multidetector CT imaging of the head and cervical spine was performed following the standard protocol without intravenous contrast. Multiplanar CT image reconstructions of the cervical spine were also generated. RADIATION DOSE REDUCTION: This exam was performed according to the departmental dose-optimization program which includes automated exposure control, adjustment of the mA and/or kV according to patient size and/or use of iterative reconstruction technique. COMPARISON:  CT head and cervical spine 08/05/2022 FINDINGS: CT HEAD FINDINGS Brain: There is no acute intracranial hemorrhage, extra-axial fluid collection, or acute infarct Parenchymal volume is within normal limits for age. The ventricles are normal in size. Gray-white differentiation is preserved There is no mass lesion.  There is no mass effect or midline shift. Vascular: There is calcification of the bilateral carotid siphons. Skull: Normal. Negative for fracture or focal lesion. Sinuses/Orbits: The imaged paranasal sinuses are clear. Bilateral lens implants are in place. The globes and orbits are otherwise unremarkable. Other: None. CT CERVICAL SPINE FINDINGS Alignment: There is unchanged reversal of the normal cervical lordosis. Offset of the dens relative to the C2 vertebral body is unchanged. There is no jumped or  perched facet. Skull base and vertebrae: The chronic dens fracture with mild anterior displacement is unchanged. Fusion of the dens with the anterior C1 ring is unchanged. There is no new/acute cervical spine fracture. Soft tissues and spinal canal: No prevertebral fluid or swelling. No visible canal hematoma. Disc levels: Bulky anterior osteophytes throughout the cervical spine are unchanged. Bulky multilevel facet arthropathy and degenerative endplate spurring is unchanged. There is no convincing high-grade spinal canal stenosis. Upper chest: There is no acute abnormality in the imaged lung apices. Other: None. IMPRESSION: 1. No acute intracranial pathology. 2. No acute fracture or traumatic malalignment of the cervical spine. 3. Unchanged chronic dens fracture. Electronically Signed   By: Valetta Mole M.D.   On: 08/15/2022 14:12   CT Cervical Spine Wo Contrast  Result Date: 08/15/2022 CLINICAL DATA:  Frequent falls for 1 week EXAM: CT HEAD WITHOUT CONTRAST CT CERVICAL SPINE WITHOUT CONTRAST TECHNIQUE: Multidetector CT imaging of the head and cervical spine was performed following the standard protocol without intravenous contrast. Multiplanar CT image reconstructions of the cervical spine were also generated. RADIATION DOSE REDUCTION: This exam was performed according to the departmental dose-optimization program which includes automated exposure control, adjustment of the mA and/or kV according to patient size and/or use of iterative reconstruction technique. COMPARISON:  CT head and cervical spine 08/05/2022 FINDINGS: CT HEAD FINDINGS Brain: There is no acute intracranial hemorrhage, extra-axial fluid collection, or acute infarct Parenchymal volume is within normal limits for age. The ventricles are normal in size. Gray-white differentiation is preserved There is no mass lesion.  There is no mass effect or midline shift. Vascular: There is calcification of the bilateral carotid siphons. Skull: Normal.  Negative for fracture or focal lesion. Sinuses/Orbits: The imaged paranasal sinuses are clear. Bilateral lens implants are in place. The globes and orbits are otherwise unremarkable. Other: None. CT CERVICAL SPINE FINDINGS Alignment: There is unchanged reversal of the normal cervical lordosis. Offset of the dens relative to the C2 vertebral body is unchanged. There is no jumped or perched facet. Skull base and vertebrae: The chronic dens fracture with mild anterior displacement is unchanged. Fusion of the dens with the anterior C1 ring is unchanged. There is no new/acute cervical spine  fracture. Soft tissues and spinal canal: No prevertebral fluid or swelling. No visible canal hematoma. Disc levels: Bulky anterior osteophytes throughout the cervical spine are unchanged. Bulky multilevel facet arthropathy and degenerative endplate spurring is unchanged. There is no convincing high-grade spinal canal stenosis. Upper chest: There is no acute abnormality in the imaged lung apices. Other: None. IMPRESSION: 1. No acute intracranial pathology. 2. No acute fracture or traumatic malalignment of the cervical spine. 3. Unchanged chronic dens fracture. Electronically Signed   By: Valetta Mole M.D.   On: 08/15/2022 14:12   DG Chest Port 1 View  Result Date: 08/15/2022 CLINICAL DATA:  Chest pains, SOB, frequent falls and weakness at least a week EXAM: PORTABLE CHEST - 1 VIEW COMPARISON:  None Available. FINDINGS: Stable left subclavian AICD. Subcentimeter calcified granuloma at the left lung base as before. Lungs otherwise clear. Heart size and mediastinal contours are within normal limits. No effusion. Vertebral endplate spurring at multiple levels in the mid and lower thoracic spine. IMPRESSION: No acute cardiopulmonary disease. Electronically Signed   By: Lucrezia Europe M.D.   On: 08/15/2022 13:27    Procedures Procedures    Medications Ordered in ED Medications  sodium chloride 0.9 % bolus 1,000 mL (1,000 mLs  Intravenous New Bag/Given 08/15/22 1330)    ED Course/ Medical Decision Making/ A&P                             Medical Decision Making Amount and/or Complexity of Data Reviewed Labs: ordered. Radiology: ordered.   72 yo F with cc of frequent falls chest pain and shortness of breath.  The patient tells me that she has been falling for a couple weeks just about every day.  She keep striking her head.  Was actually seen about a week ago and had a CT scan that was normal.  She continues to have headache and neck pain.  Tells me that neck pain is chronic.  She has trouble describing the chest pain and difficulty breathing that reportedly brought her in.  Has been going on for about a week.  Will obtain a chest x-ray blood work CT of the head and C-spine.  Troponin negative.  CTA head and C-spine without acute change.  Chest x-ray independently interpreted by me without focal infiltrate or pneumothorax.  No significant anemia no significant electrolyte abnormality.  2:49 PM:  I have discussed the diagnosis/risks/treatment options with the patient and family.  Evaluation and diagnostic testing in the emergency department does not suggest an emergent condition requiring admission or immediate intervention beyond what has been performed at this time.  They will follow up with PCP. We also discussed returning to the ED immediately if new or worsening sx occur. We discussed the sx which are most concerning (e.g., sudden worsening pain, fever, inability to tolerate by mouth) that necessitate immediate return. Medications administered to the patient during their visit and any new prescriptions provided to the patient are listed below.  Medications given during this visit Medications  sodium chloride 0.9 % bolus 1,000 mL (1,000 mLs Intravenous New Bag/Given 08/15/22 1330)     The patient appears reasonably screen and/or stabilized for discharge and I doubt any other medical condition or other [facility] requiring  further screening, evaluation, or treatment in the ED at this time prior to discharge.          Final Clinical Impression(s) / ED Diagnoses Final diagnoses:  Nonspecific chest pain  Frequent falls    Rx / DC Orders ED Discharge Orders     None         Deno Etienne, DO 08/15/22 1450

## 2022-08-15 NOTE — Discharge Instructions (Signed)
Please call your family doctor today and let them know about your visit to the emergency department.  They may want to see you soon in the office and see if they can stop one of your medications or maybe prescribe physical therapy or have someone come to your house to make sure it is safe this can be for you.  Please return to the emergency department for worsening chest pain confusion vomiting.

## 2022-08-15 NOTE — ED Notes (Signed)
Pt in CT.

## 2022-08-19 ENCOUNTER — Telehealth: Payer: Self-pay | Admitting: Pharmacy Technician

## 2022-08-19 DIAGNOSIS — Z596 Low income: Secondary | ICD-10-CM

## 2022-08-19 NOTE — Progress Notes (Signed)
[facility])                                            [place] Team    08/19/2022  Tishanna Dunford Sep 22, 1950 [id number]                                      Medication Assistance Referral  Referral From: [place]  Medication/Company: Judithann Sauger / [place] Patient application portion:  Mailed Provider application portion: Faxed  to Dr. Marshell Garfinkel Provider address/fax verified via: Office website  Pammy Vesey P. Teyona Nichelson, [place]  [phone]

## 2022-08-23 ENCOUNTER — Telehealth: Payer: Self-pay

## 2022-08-23 NOTE — Patient Instructions (Signed)
Visit Information  Thank you for taking time to visit with me today. Please don't hesitate to contact me if I can be of assistance to you.   Following are the goals we discussed today:   Goals Addressed             This Visit's Progress    Patient to manage and monitor signs and symptoms of Heart Failure and Falls       Care Coordination Interventions:  Provided education on low sodium diet Reviewed Heart Failure Action Plan in depth and provided  Discussed importance of daily weight and advised patient to weigh and record daily Discussed the importance of keeping all appointments with provider Active listening / Reflection utilized  Emotional Support Provided Problem [place] strategies reviewed Dicussed fall prevetion Use walker Earlier this morning, I received a call from [name], who needed my help scheduling an appointment with her primary care physician. 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 She also informed me of two recent falls, recurring headaches, and difficulty in walking. Although her daughter had purchased a transport wheelchair for her, she could not use it to move around the house. I asked if she needed the appointment for the same day, but she declined and requested a later date. Eventually, I was able to secure an appointment for her on August 29, 2022, at 2:30 pm, which she accepted. [name] expressed her gratitude for the assistance.        If you are experiencing a Mental Health or [place] or need someone to talk to, please call 1-800-273-TALK (toll free, 24 hour hotline)   Patient verbalizes understanding of instructions and care plan provided today and agrees to view in [place]. Active MyChart status and patient understanding of how to access instructions and care plan via MyChart confirmed with patient.     Follow up at thee next scheduled interval  Lazaro Arms RN, BSN, [place] [facility]   Phone: [phone]

## 2022-08-23 NOTE — Patient Outreach (Signed)
  Care Coordination   Follow Up Visit Note   08/23/2022 Name: Veronica Shaw MRN: [id number] DOB: Dec 03, 1950  Veronica Shaw is a 72 y.o. year old female who sees Veronica Heck, MD for primary care. I spoke with  Veronica Shaw by phone today.  What matters to the patients health and wellness today?  I am having headaches    Goals Addressed             This Visit's Progress    Patient to manage and monitor signs and symptoms of Heart Failure and Falls       Care Coordination Interventions:  Provided education on low sodium diet Reviewed Heart Failure Action Plan in depth and provided  Discussed importance of daily weight and advised patient to weigh and record daily Discussed the importance of keeping all appointments with provider Active listening / Reflection utilized  Emotional Support Provided Problem [place] strategies reviewed Dicussed fall prevetion Use walker Earlier this morning, I received a call from Veronica Shaw, who needed my help scheduling an appointment with her primary care physician. 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 She also informed me of two recent falls, recurring headaches, and difficulty in walking. Although her daughter had purchased a transport wheelchair for her, she could not use it to move around the house. I asked if she needed the appointment for the same day, but she declined and requested a later date. Eventually, I was able to secure an appointment for her on August 29, 2022, at 2:30 pm, which she accepted. Veronica Shaw expressed her gratitude for the assistance.        SDOH assessments and interventions completed:  No     Care Coordination Interventions:  Yes, provided   Follow up plan:  at the next scheduled interval    Encounter Outcome:  Pt. Visit Completed   Veronica Arms RN, BSN, [place] [facility]   Phone:  [phone]

## 2022-08-25 ENCOUNTER — Ambulatory Visit (INDEPENDENT_AMBULATORY_CARE_PROVIDER_SITE_OTHER): Payer: Medicare Other

## 2022-08-25 DIAGNOSIS — I428 Other cardiomyopathies: Secondary | ICD-10-CM | POA: Diagnosis not present

## 2022-08-25 LAB — CUP PACEART REMOTE DEVICE CHECK
Battery Remaining Longevity: 15 mo
Battery Voltage: 2.9 V
Brady Statistic AP VP Percent: 0 %
Brady Statistic AP VS Percent: 0 %
Brady Statistic AS VP Percent: 0.03 %
Brady Statistic AS VS Percent: 99.96 %
Brady Statistic RA Percent Paced: 0.01 %
Brady Statistic RV Percent Paced: 0.03 %
Date Time Interrogation Session: 20240125044224
HighPow Impedance: 76 Ohm
Implantable Lead Connection Status: 753985
Implantable Lead Connection Status: 753985
Implantable Lead Implant Date: 20150427
Implantable Lead Implant Date: 20150427
Implantable Lead Location: 753859
Implantable Lead Location: 753860
Implantable Lead Model: 5076
Implantable Lead Model: 6935
Implantable Pulse Generator Implant Date: 20150427
Lead Channel Impedance Value: 399 Ohm
Lead Channel Impedance Value: 4047 Ohm
Lead Channel Impedance Value: 4047 Ohm
Lead Channel Impedance Value: 4047 Ohm
Lead Channel Impedance Value: 475 Ohm
Lead Channel Impedance Value: 551 Ohm
Lead Channel Pacing Threshold Amplitude: 0.625 V
Lead Channel Pacing Threshold Amplitude: 0.75 V
Lead Channel Pacing Threshold Pulse Width: 0.4 ms
Lead Channel Pacing Threshold Pulse Width: 0.4 ms
Lead Channel Sensing Intrinsic Amplitude: 13.25 mV
Lead Channel Sensing Intrinsic Amplitude: 13.25 mV
Lead Channel Sensing Intrinsic Amplitude: 2 mV
Lead Channel Sensing Intrinsic Amplitude: 2 mV
Lead Channel Setting Pacing Amplitude: 2 V
Lead Channel Setting Pacing Amplitude: 2.5 V
Lead Channel Setting Pacing Pulse Width: 0.4 ms
Lead Channel Setting Sensing Sensitivity: 0.3 mV
Zone Setting Status: 755011
Zone Setting Status: 755011

## 2022-08-29 ENCOUNTER — Ambulatory Visit (INDEPENDENT_AMBULATORY_CARE_PROVIDER_SITE_OTHER): Payer: Medicare Other | Admitting: Student

## 2022-08-29 ENCOUNTER — Encounter: Payer: Self-pay | Admitting: Student

## 2022-08-29 VITALS — BP 105/62 | HR 102 | Ht 61.0 in

## 2022-08-29 DIAGNOSIS — R296 Repeated falls: Secondary | ICD-10-CM

## 2022-08-29 DIAGNOSIS — G20A1 Parkinson's disease without dyskinesia, without mention of fluctuations: Secondary | ICD-10-CM | POA: Diagnosis not present

## 2022-08-29 NOTE — Patient Instructions (Signed)
It was great to see you! Thank you for allowing me to participate in your care!   Our plans for today:  - Please do tylenol as needed for you headache-your neuro exam was stable  - I placed home health PT, OT and RN for you to help with strengthening at home - I believe your falls are most likely related to your progression of parkinsons  Take care and seek immediate care sooner if you develop any concerns.  Gerrit Heck, MD

## 2022-08-29 NOTE — Progress Notes (Signed)
SUBJECTIVE:   CHIEF COMPLAINT / HPI: Frequent falls  Was seen twice in the ED 1 on 08/05/2022 and 1 on 08/15/2022 for increasing falls and chest pain.  CT head and C-spine which were negative for any acute changes.  Chest x-ray did not show any issues.  Labs overall normal.  Troponins were negative at that time.  Last neurology visit 06/21/2022 where they increased her carbidopa levodopa to 25/102 tablets at 7 AM and 2 at 11 AM and 1 tablet at 4 PM.  She reports that she is only taking 1 tablet 4 times daily. She says she felt funny the next day she had taken the changed dose and had not gone back to that since. Neurology recommended further neurocognitive testing as MoCA and MMSE- they had referred but patient did not go for additional testing. Had a couple of falls after ED visit but did not hit head. Her head at the top has been hurting for about 3 weeks ago. She says she has an old neck fracture (chronic dens fracture) aggravated. She says that prior to the first ED visit she was using a rollator and now she has been using a wheelchair.  She says the falls usually happen when she is walking and turning to left or right and loses balance in her feet.  Denies any lightheadedness/spinning of the room sensation. No vomiting or vison changes.   Supposed to take Lasix 20 daily doing 40 every Tuesday and Thursday-not really doing that - due to incontinence and husband having to change her diapers.  Rarely taking meclizine -recently referred to vestibular rehab but did not go.  Denies any spinning of the room sensation.  Robaxin taking as needed.  Not taking tizanidine currently.  Patient and granddaughter requesting palliative care services for symptom management as well as home health PT/OT/RN.  PERTINENT  PMH / PSH: History of Parkinson's and instability, history of vertigo, heart failure  OBJECTIVE:   BP 105/62 Comment: provider released berfore repeat  Pulse (!) 102 Comment: Provider released  before repeat  Ht 5' 1"$  (1.549 m)   LMP 08/01/2000 (Approximate)   SpO2 94%   BMI 22.30 kg/m   General: Well appearing, NAD, awake, alert, responsive to questions Head: Normocephalic atraumatic- no lacerations or bruising on top of head-tender to palpation on top of head CV: Regular rate and rhythm no murmurs rubs or gallops Respiratory: Clear to ausculation bilaterally, no wheezes rales or crackles, chest rises symmetrically,  no increased work of breathing Extremities: Moves upper and lower extremities freely, no edema in LE Neuro: No focal deficits Skin: No rashes or lesions visualized CN II: PERRL CN III, IV,VI: EOMI CV V: Normal sensation in V1, V2, V3 CVII: Symmetric smile and brow raise CN VIII: Normal hearing CN IX,X: Symmetric palate raise  CN XI: 5/5 shoulder shrug CN XII: Symmetric tongue protrusion,some fasciculations UE and LE strength 5/5 Normal sensation in UE and LE bilaterally   ASSESSMENT/PLAN:   Falls frequently Patient did not have any falls hitting her head after her last ED visit.  She has progressed to requiring wheelchair due to imbalance and progression of her Parkinson's disease.  Does not seem to be vertigo/lightheadedness symptoms causing falls.  She is not taking her Parkinson's medications as indicated which I discussed with patient.  Neuroexam stable, CT scans recently with no acute issues. -Follow up with neuro in February -Palliative care services referral requested and placed -Home health PT/OT/RN -Tylenol as needed for pain as  well as Voltaren gel    Gerrit Heck, MD [place]

## 2022-08-29 NOTE — Assessment & Plan Note (Signed)
Patient did not have any falls hitting her head after her last ED visit.  She has progressed to requiring wheelchair due to imbalance and progression of her Parkinson's disease.  Does not seem to be vertigo/lightheadedness symptoms causing falls.  She is not taking her Parkinson's medications as indicated which I discussed with patient.  Neuroexam stable, CT scans recently with no acute issues. -Follow up with neuro in February -Palliative care services referral requested and placed -Home health PT/OT/RN -Tylenol as needed for pain as well as Voltaren gel

## 2022-08-30 ENCOUNTER — Other Ambulatory Visit: Payer: No Typology Code available for payment source

## 2022-08-30 ENCOUNTER — Telehealth: Payer: Self-pay

## 2022-08-30 DIAGNOSIS — R519 Headache, unspecified: Secondary | ICD-10-CM | POA: Diagnosis not present

## 2022-08-30 DIAGNOSIS — G20A1 Parkinson's disease without dyskinesia, without mention of fluctuations: Secondary | ICD-10-CM | POA: Diagnosis not present

## 2022-08-30 DIAGNOSIS — Z8781 Personal history of (healed) traumatic fracture: Secondary | ICD-10-CM | POA: Diagnosis not present

## 2022-08-30 DIAGNOSIS — Z9981 Dependence on supplemental oxygen: Secondary | ICD-10-CM | POA: Diagnosis not present

## 2022-08-30 DIAGNOSIS — Z515 Encounter for palliative care: Secondary | ICD-10-CM

## 2022-08-30 NOTE — Telephone Encounter (Signed)
Cathy from [facility] calling for nursing verbal orders as follows:  2 time(s) weekly for 4 week(s), then 1 time(s) weekly for 4 week(s)  Verbal orders given per [facility] protocol  Talbot Grumbling, RN

## 2022-08-30 NOTE — Telephone Encounter (Signed)
(  10:47 am) PC SW scheduled an initial palliative care visit with patient. Patient scheduled with the palliative nurse/SW team for 09/06/22 @ 10 am.

## 2022-08-31 ENCOUNTER — Telehealth: Payer: Self-pay

## 2022-08-31 DIAGNOSIS — R519 Headache, unspecified: Secondary | ICD-10-CM | POA: Diagnosis not present

## 2022-08-31 DIAGNOSIS — Z9981 Dependence on supplemental oxygen: Secondary | ICD-10-CM | POA: Diagnosis not present

## 2022-08-31 DIAGNOSIS — Z8781 Personal history of (healed) traumatic fracture: Secondary | ICD-10-CM | POA: Diagnosis not present

## 2022-08-31 DIAGNOSIS — G20A1 Parkinson's disease without dyskinesia, without mention of fluctuations: Secondary | ICD-10-CM | POA: Diagnosis not present

## 2022-08-31 NOTE — Progress Notes (Signed)
COMMUNITY PALLIATIVE CARE SW NOTE  PATIENT NAME: Veronica Shaw DOB: 1950/12/30 MRN: [id number]  PRIMARY CARE PROVIDER: Gerrit Heck, MD  RESPONSIBLE PARTY:  Acct ID - Guarantor Home Phone Work Phone Relationship Acct Type  [id number] [name]* [phone]  Self P/F     [place] [address] [place], [place], [place] [address]   Clinical Social Work Initial Palliative Care Telephonic Encounter  PC SW contacted patient's granddaughter-[name] to discuss palliative care services.SW provided education to her and she agreed that she would like to start services for her grandmother. She provided a brief status update on patient status. She advised that patient has Parkinsons' Disease. She has had six falls in the past month. She went from using her rollator walker to a wheelchair. She is eating 25-50% of three meals per day. Her weight is currently 107 lbs.  Patient lives alone with daily contact. No immediate needs noted.   Next scheduled visit will be 09/06/22 @ 10 am at her home.  Social History   Tobacco Use   Smoking status: Never    Passive exposure: Past   Smokeless tobacco: Never   Tobacco comments:    exposed to smoke during child hood (parents)  Substance Use Topics   Alcohol use: No    Alcohol/week: 0.0 standard drinks of alcohol    CODE STATUS: Full Code ADVANCED DIRECTIVES: Goals of Care MOST FORM COMPLETE:  No HOSPICE EDUCATION PROVIDED: No  Duration of encounter and documentation: 30 minutes  [employer], LCSW

## 2022-08-31 NOTE — Telephone Encounter (Signed)
Radovan from [place] for [place] verbal orders as follows:  1 time(s) weekly for 4 week(s), then 2 time(s) weekly for 4 week(s)  Verbal orders given per [facility] protocol  Talbot Grumbling, RN [phone]

## 2022-09-02 ENCOUNTER — Other Ambulatory Visit: Payer: Self-pay | Admitting: *Deleted

## 2022-09-02 DIAGNOSIS — R519 Headache, unspecified: Secondary | ICD-10-CM | POA: Diagnosis not present

## 2022-09-02 DIAGNOSIS — Z9981 Dependence on supplemental oxygen: Secondary | ICD-10-CM | POA: Diagnosis not present

## 2022-09-02 DIAGNOSIS — Z8781 Personal history of (healed) traumatic fracture: Secondary | ICD-10-CM | POA: Diagnosis not present

## 2022-09-02 DIAGNOSIS — G20A1 Parkinson's disease without dyskinesia, without mention of fluctuations: Secondary | ICD-10-CM | POA: Diagnosis not present

## 2022-09-02 MED ORDER — BREZTRI AEROSPHERE 160-9-4.8 MCG/ACT IN AERO: 2.0000 | INHALATION_SPRAY | Freq: Two times a day (BID) | RESPIRATORY_TRACT | 11 refills | Status: AC

## 2022-09-02 NOTE — Telephone Encounter (Deleted)
     Fax received from Thressa Sheller for [place], [place] patient assistance.  Form completed and faxed,attention Sharee Pimple, [phone].  Nothing further at this time.

## 2022-09-02 NOTE — Progress Notes (Signed)
Fax received from Thressa Sheller for [place], [place] patient assistance.  Form completed and faxed,attention Sharee Pimple, [phone].  Nothing further at this time.

## 2022-09-02 NOTE — Telephone Encounter (Signed)
Fax received from [place], CPhT for [facility] [place] patient assistance.  Form completed and faxed to Graylon Good [phone].  Confirmation received.  Nothing further at this time.

## 2022-09-05 ENCOUNTER — Ambulatory Visit: Payer: Medicare Other | Attending: Internal Medicine

## 2022-09-05 DIAGNOSIS — I5022 Chronic systolic (congestive) heart failure: Secondary | ICD-10-CM | POA: Diagnosis not present

## 2022-09-05 DIAGNOSIS — Z95810 Presence of automatic (implantable) cardiac defibrillator: Secondary | ICD-10-CM

## 2022-09-05 DIAGNOSIS — G20A1 Parkinson's disease without dyskinesia, without mention of fluctuations: Secondary | ICD-10-CM | POA: Diagnosis not present

## 2022-09-05 DIAGNOSIS — Z9981 Dependence on supplemental oxygen: Secondary | ICD-10-CM | POA: Diagnosis not present

## 2022-09-05 DIAGNOSIS — R519 Headache, unspecified: Secondary | ICD-10-CM | POA: Diagnosis not present

## 2022-09-05 DIAGNOSIS — Z8781 Personal history of (healed) traumatic fracture: Secondary | ICD-10-CM | POA: Diagnosis not present

## 2022-09-06 ENCOUNTER — Other Ambulatory Visit: Payer: Medicare Other

## 2022-09-07 ENCOUNTER — Other Ambulatory Visit: Payer: Medicare Other

## 2022-09-07 VITALS — BP 116/64 | HR 120 | Temp 97.0°F | Resp 18

## 2022-09-07 DIAGNOSIS — Z515 Encounter for palliative care: Secondary | ICD-10-CM

## 2022-09-07 DIAGNOSIS — G20A1 Parkinson's disease without dyskinesia, without mention of fluctuations: Secondary | ICD-10-CM | POA: Diagnosis not present

## 2022-09-07 DIAGNOSIS — R519 Headache, unspecified: Secondary | ICD-10-CM | POA: Diagnosis not present

## 2022-09-07 DIAGNOSIS — Z8781 Personal history of (healed) traumatic fracture: Secondary | ICD-10-CM | POA: Diagnosis not present

## 2022-09-07 DIAGNOSIS — Z9981 Dependence on supplemental oxygen: Secondary | ICD-10-CM | POA: Diagnosis not present

## 2022-09-07 NOTE — Progress Notes (Signed)
EPIC Encounter for ICM Monitoring  Patient Name: Veronica Shaw is a 72 y.o. female Date: 09/07/2022 Primary Care Physican: Gerrit Heck, MD Primary Cardiologist: Cooper/Weaver PA Electrophysiologist: Lovena Le 05/06/2022 Weight: 119 lbs 06/17/2022 Weight: 119 lbs                                  Spoke with patient and heart failure questions reviewed.  Transmission results reviewed.  Pt asymptomatic for fluid accumulation.  Reports feeling well at this time and voices no complaints.     OptiVol Thoracic impedance trending close to normal baseline.    Prescribed:  Furosemide 20 mg Take 1 tablet (20 mg total) by mouth daily except take 2 tablets (40 mg total) every Monday & Thursday.   Potassium 10 mEq take 1 tablet (10 mEq total) by mouth daily   Labs: 08/15/2022 Creatinine 1.18, BUN 15, Potassium 4.1, Sodium 140,  08/12/2022 Creatinine 1.06, BUN 21, Potassium 4.4, Sodium 142, GFR 55 06/28/2022 Creatinine 1.18, BUN 18, Potassium 4.1, Sodium 142, GFR 49 03/19/2022 Creatinine 1.24, BUN 29, Potassium 4.5, Sodium 138 01/06/2022 Creatinine 1.25, BUN 24, Potassium 4.3, Sodium 140 08/20/2021 Creatinine 1.11, BUN 21, Potassium 4.3, Sodium 140, GFR 53 A complete set of results can be found in Results Review.   Recommendations:  No changes and encouraged to call if experiencing any fluid symptoms.   Follow-up plan: ICM clinic phone appointment on 10/10/2022.   91 day device clinic remote transmission 11/24/2022.     EP/Cardiology Office Visits:     Recalls 01/17/2022 for [name] (yearly visit).  12/19/2022 with Dr Burt Knack.   Copy of ICM check sent to Dr. Lovena Le.      3 month ICM trend: 09/05/2022.    12-14 Month ICM trend:     [name], RN 09/07/2022 1:33 PM

## 2022-09-07 NOTE — Progress Notes (Signed)
COMMUNITY PALLIATIVE CARE SW NOTE  PATIENT NAME: Veronica Shaw DOB: 10-Oct-1950 MRN: [id number]  PRIMARY CARE PROVIDER: Levin Erp, MD  RESPONSIBLE PARTY:  Acct ID - Guarantor Home Phone Work Phone Relationship Acct Type  [id number] Lyman Bishop* [phone]  Self P/F     [address], [place], [place] [address]   (DUPLICATE NOTE)  Social Work Encounter   [employer] SW completed a encounter with patient and her granddaughter-E. Cowan and patient's husband-Robert at her home. Education was provided regarding palliative care services, role in patient's care and visit frequency. Patient and granddaughter verbalized understanding and signed consent to services. Patient report that she has neuropathy and is constant pain. She reports SOB with exertion and was observed as patient talked. She reports that her SOB is worse at night and evening. Patient report that she wears o2 at night at 2L via nasal cannula. She was ambulating with a rollator walker, but she is no longer ambulatory. Patient's husband uses the rollator to push her in like a wheelchair. She report her appetite has decreased, but she is eating 2-3 small meals per day and a snack. She has had several falls (6 in the past three months).  Patient has constant pain from her neck due to an old fracture that she manages with Tylenol. Patient report that this is not working and needs more assistance. Patient receives PT/OT through [facility]. Patient has Medicare and [employer].    SW provided education regarding advance directives and left HCPOA/Living Will information and MOST form in the home for patient to review further. Patient desires to be a Full Code.       SOCIAL HX: Patient is from [place] She worked as a Agricultural engineer. She has been married for 35 years.  Social History         Tobacco Use   Smoking status: Never      Passive exposure: Past   Smokeless tobacco: Never   Tobacco comments:       exposed to smoke during child hood (parents)  Substance Use Topics   Alcohol use: No      Alcohol/week: 0.0 standard drinks of alcohol      CODE STATUS: Full Code ADVANCED DIRECTIVES: Educated MOST FORM COMPLETE:  Educated HOSPICE EDUCATION PROVIDED: No   Duration of visit and documentation: 60 minutes  [employer], LCSW

## 2022-09-08 ENCOUNTER — Ambulatory Visit: Payer: Self-pay

## 2022-09-08 DIAGNOSIS — Z9981 Dependence on supplemental oxygen: Secondary | ICD-10-CM | POA: Diagnosis not present

## 2022-09-08 DIAGNOSIS — Z8781 Personal history of (healed) traumatic fracture: Secondary | ICD-10-CM | POA: Diagnosis not present

## 2022-09-08 DIAGNOSIS — G20A1 Parkinson's disease without dyskinesia, without mention of fluctuations: Secondary | ICD-10-CM | POA: Diagnosis not present

## 2022-09-08 DIAGNOSIS — R519 Headache, unspecified: Secondary | ICD-10-CM | POA: Diagnosis not present

## 2022-09-08 NOTE — Patient Instructions (Signed)
Visit Information  Thank you for taking time to visit with me today. Please don't hesitate to contact me if I can be of assistance to you.   Following are the goals we discussed today:   Goals Addressed             This Visit's Progress    Patient to manage and monitor signs and symptoms of Heart Failure and Falls       Patient Goals/Self Care Activities: -Patient/Caregiver will self-administer medications as prescribed as evidenced by self-report/primary caregiver report  -Patient/Caregiver will attend all scheduled provider appointments as evidenced by clinician review of documented attendance to scheduled appointments and patient/caregiver report -Patient/Caregiver will call pharmacy for medication refills as evidenced by patient report and review of pharmacy fill history as appropriate -Patient/Caregiver will call provider office for new concerns or questions as evidenced by review of documented incoming telephone call notes and patient report -Patient/Caregiver verbalizes understanding of plan  -Weigh daily and record (notify MD with 3 lb weight gain over night or 5 lb in a week) -Follow CHF Action Plan -Adhere to low sodium diet  -Utilize wheelchair (assistive device) appropriately with all ambulation -De-clutter walkways -Change positions slowly -Always wear secure fitting shoes with ambulation -Utilize home lighting for dim lit areas          Our next appointment is by telephone on 10/05/22 at 130 pm  Please call the care guide team at [phone] if you need to cancel or reschedule your appointment.   If you are experiencing a Mental Health or [place] or need someone to talk to, please call 1-800-273-TALK (toll free, 24 hour hotline)  Patient verbalizes understanding of instructions and care plan provided today and agrees to view in [place]. Active MyChart status and patient understanding of how to access instructions and care plan via MyChart confirmed  with patient.     Lazaro Arms RN, BSN, [facility]   Phone: [phone]

## 2022-09-08 NOTE — Patient Outreach (Signed)
  Care Coordination   Follow Up Visit Note   09/08/2022 Name: Veronica Shaw MRN: [id number] DOB: 04-09-1951  Veronica Shaw is a 73 y.o. year old female who sees Gerrit Heck, MD for primary care. I spoke with  Terie Purser by phone today.  What matters to the patients health and wellness today?  Veronica Shaw reported that she had been seen at the office and that a home health physical therapist, occupational therapist, and nurse were coming to her home. Palliative care has also conducted an interview. She is currently unable to walk on her own and relies on a wheelchair, with her husband's help. We discussed safety measures to prevent falls at home. Despite experiencing slight shortness of breath, she denies any chest pain or swelling.    Goals Addressed             This Visit's Progress    Patient to manage and monitor signs and symptoms of Heart Failure and Falls       Patient Goals/Self Care Activities: -Patient/Caregiver will self-administer medications as prescribed as evidenced by self-report/primary caregiver report  -Patient/Caregiver will attend all scheduled provider appointments as evidenced by clinician review of documented attendance to scheduled appointments and patient/caregiver report -Patient/Caregiver will call pharmacy for medication refills as evidenced by patient report and review of pharmacy fill history as appropriate -Patient/Caregiver will call provider office for new concerns or questions as evidenced by review of documented incoming telephone call notes and patient report -Patient/Caregiver verbalizes understanding of plan  -Weigh daily and record (notify MD with 3 lb weight gain over night or 5 lb in a week) -Follow CHF Action Plan -Adhere to low sodium diet  -Utilize wheelchair (assistive device) appropriately with all ambulation -De-clutter walkways -Change positions slowly -Always wear secure fitting shoes with ambulation -Utilize home  lighting for dim lit areas          SDOH assessments and interventions completed:  No     Care Coordination Interventions:  Yes, provided   Lazaro Arms RN, BSN, [facility]   Phone: [phone]      Follow up plan: Follow up call scheduled for 10/05/22 130 pm    Encounter Outcome:  Pt. Visit Completed   Lazaro Arms RN, BSN, [facility]   Phone: [phone]

## 2022-09-08 NOTE — Progress Notes (Signed)
PATIENT NAME: Veronica Shaw DOB: 03-29-1951 MRN: [id number]  PRIMARY CARE PROVIDER: Gerrit Heck, MD  RESPONSIBLE PARTY:  Acct ID - Guarantor Home Phone Work Phone Relationship Acct Type  [id number] Sheliah [id number]  Self P/F     [place], [place], [place] [address]   Palliative Care Initial Encounter Note   Completed visit w/Monica Tera Helper (husband), Charlena Cross (granddaughter) also present.     HISTORY OF PRESENT ILLNESS:  72 yo F with a hx of chest pain and shortness of breath, Parkinson's and has instability at baseline.  Respiratory: reports feeling SOB at times; encouraged pt to perform purse lip breathing;   Cardiac: HR 120; pt reports this is not normal and is possibly the reason "I feel funny"; denies the need for EMS or a call to her PCP  Cognitive: alert and oriented with appropriate conversation   Appetite: Granddaughter reports pt eats 75% of breakfast and dinner and a light lunch; snacks on fruit   Mobility: last fall was the 3rd of Jan - 6 falls with the Rollator in the past month; now uses Rollator to stand then transfers to wheelchair;   ADLs: Mostly done for her since she no longer walks around the home   Sleeping Pattern: sleeps well except when she wakes up in the middle of the night from leg pain from an old fracture  Pain: in leg - 3/10 right now; 8-9/10 the worst she feels at night; has Voltaren gel and takes Tylenol   Palliative Care/ Hospice: RN explained role and purpose of palliative care including visit frequency. Also discussed benefits of hospice care as well as the differences between the two with patient.   Goals of Care: To stay in the home as long as possible   Granddaughter and patient asked that Palliative Care Nurse contact Dr.Tat for Gabapentin refills and anti-anxiety medication.    CODE STATUS: Full Code ADVANCED DIRECTIVES: N MOST FORM: N PPS: 40%   PHYSICAL EXAM:   VITALS: Today's Vitals    09/07/22 1448  BP: 116/64  Pulse: (!) 120  Resp: 18  Temp: (!) 97 F (36.1 C)  SpO2: 95%    LUNGS: clear to auscultation  CARDIAC: Cor RRR EXTREMITIES: appears to have less muscle tone; no full ROM SKIN:  cool, dry NEURO: positive for coordination problems, gait problems, and weakness      Marijean Montanye Georgann Housekeeper, LPN

## 2022-09-09 ENCOUNTER — Telehealth: Payer: Self-pay

## 2022-09-09 DIAGNOSIS — Z515 Encounter for palliative care: Secondary | ICD-10-CM

## 2022-09-09 NOTE — Telephone Encounter (Signed)
Palliative Care Nurse called pt to notify her of the response from Dr Tat. No meds will be refilled or prescribed. Pt voiced understanding.

## 2022-09-12 ENCOUNTER — Telehealth: Payer: Self-pay | Admitting: Cardiovascular Disease

## 2022-09-12 DIAGNOSIS — Z9981 Dependence on supplemental oxygen: Secondary | ICD-10-CM | POA: Diagnosis not present

## 2022-09-12 DIAGNOSIS — G20A1 Parkinson's disease without dyskinesia, without mention of fluctuations: Secondary | ICD-10-CM | POA: Diagnosis not present

## 2022-09-12 DIAGNOSIS — R519 Headache, unspecified: Secondary | ICD-10-CM | POA: Diagnosis not present

## 2022-09-12 DIAGNOSIS — Z8781 Personal history of (healed) traumatic fracture: Secondary | ICD-10-CM | POA: Diagnosis not present

## 2022-09-12 NOTE — Telephone Encounter (Signed)
Return call to patient and physical therapist, Jacklyn Shell. Patient has elevated HR of 120. BP is 108/60. PT had patient recline and relax and HR came down to 114. Patient took metoprolol today and is due a second dose this evening. Patient has ICD.  Patient denies chest pain, SOB and palpitations.   Advised patient if symptoms worsen or she develops chest pain, SOB to call EMS or go to the ED for evaluation. Patient verbalized understanding.   Scheduled for 2/14 with doctor of the day for evaluation.

## 2022-09-12 NOTE — Telephone Encounter (Signed)
PT is calling to give abnormal HR to triage nurse

## 2022-09-13 NOTE — Progress Notes (Unsigned)
Cardiology Office Note:    Date:  09/14/2022   ID:  Veronica Shaw, DOB 28-Dec-1950, MRN [id number]  PCP:  Gerrit Heck, [place] Prov[facility] Cardiologist:  Sherren Mocha, MD Cardiology APP:  Liliane Shi, PA-C  Electrophysiologist:  Cristopher Peru, MD     Referring MD: Gerrit Heck, MD   DOD: Tachycardia  History of Present Illness:    Veronica Shaw is a 72 y.o. female with a hx of Parkinsons disease: She has had six falls in the past month. She went from using her rollator walker to a wheelchair. She is eating 25-50% of three meals per day She is following with palliative care. Has Chronic HFrEF stable Optivol in monitoring 09/05/2022. EF 40-45% in 2022 ICD MDR CRT-D. DOD eval for tachycardia NOS  Patient notes that she is here because her physical therapist was was in the 120s.  She has had some shortness of breath. Without taking the metoprolol her blood pressure has been SBP 110.  In the past in [place]; over 20 years ago, has SVT NOS.  She notes DOE that is new. No chest pain or pressure Device Interrogation reviewed: No VT, AF, ABL, optivol has been stable.   No fevers, chills, night sweats.  No issues swallowing.  No No palpitations or syncope .   Past Medical History:  Diagnosis Date   Acute on chronic systolic CHF (congestive heart failure), NYHA class 4 (HCC) 10/19/2020   Asthma    Back pain 09/18/2019   Cervical disc disorder with radiculopathy of cervical region [id number]   Chronic systolic CHF (congestive heart failure) ([place])    a. cMRI 4/15: EF 34% and findings - c/w NICM, normal RV size and function (RVEF 61%), Mild MR // b. Echo 2/15:  EF 30-35%, diff HK, ant-sept AK, Gr 2 DD, mild MR, trivial TR  //  c. Echo 5/17: EF 20-25%, severe diffuse HK, marked systolic dyssynchrony, grade 1 diastolic dysfunction, mild MR  //  d. [place] 5/17: Fick CO 2.9, RVSP 19, PASP 15, PW mean 2, low filing pressures and preserved CO     Cognitive impairment 10/19/2017   MOCA was administered with a score of 23/30   Diabetes mellitus    Diabetic peripheral neuropathy ([place]) 12/09/2011   Dyspnea 09/19/2012   Flu 10/17/2017   Gastritis    History of echocardiogram    Echo 6/18: EF 30-35, diffuse HK, grade 1 diastolic dysfunction, trivial MR, mild LAE, mild TR, no pericardial effusion   History of nuclear stress test    Myoview 5/18: EF 49, no ischemia, inferoseptal defect c/w LBBB artifact (intermediate risk due to EF < 50).   HTN (hypertension)    Hyperlipidemia    Hyperlipidemia associated with type 2 diabetes mellitus ([place]) 03/05/2019   Hypertension associated with diabetes ([place]) 09/28/2006   Qualifier: Diagnosis of  By: [name]     Major depression 03/31/2013   Melena 08/21/2020   NICM (nonischemic cardiomyopathy) ([place])    a. Nuclear 5/13: Normal stress nuclear study. LV Ejection Fraction: 58%  //  b. LHC 10/14: Minor luminal irregularity in prox LAD, EF 35%    Parkinson disease    Plantar fasciitis    Plantar fasciitis, bilateral 06/15/2012   Rosacea 05/29/2009   Qualifier: Diagnosis of  By: [name] MD, [name] hearing loss (SNHL), bilateral 01/04/2017   Sleep apnea    was retested and no longer had it and so  d/c CPAP   Syncope    Thoracic or lumbosacral neuritis or radiculitis, unspecified 07/03/2013   Tinnitus, bilateral 01/04/2017   Type II diabetes mellitus with complication ([place]) 99991111   Diabetic eye exam done by [facility]mology: Dr. Prudencio Burly on 11/08/13: no diabetic retinopathy. Repeat in 1 year     Urticaria     Past Surgical History:  Procedure Laterality Date   BREAST EXCISIONAL BIOPSY Left 1970   benign cyst   BREAST SURGERY Left    BUNIONECTOMY     CARDIAC CATHETERIZATION N/A 12/16/2015   Procedure: Right Heart Cath;  Surgeon: Sherren Mocha, MD;  Location: [facility];  Service: Cardiovascular;  Laterality: N/A;   CHOLECYSTECTOMY     eye lid surgery Bilateral  05/09/2019   IMPLANTABLE CARDIOVERTER DEFIBRILLATOR IMPLANT  11-25-13   MDT dual chamber ICD implanted by [name] for primary prevention   IMPLANTABLE CARDIOVERTER DEFIBRILLATOR IMPLANT N/A 11/25/2013   Procedure: IMPLANTABLE CARDIOVERTER DEFIBRILLATOR IMPLANT;  Surgeon: [name], MD;  Location: [facility] CATH LAB;  Service: Cardiovascular;  Laterality: N/A;   LEFT AND RIGHT HEART CATHETERIZATION WITH CORONARY ANGIOGRAM N/A 05/31/2013   Procedure: LEFT AND RIGHT HEART CATHETERIZATION WITH CORONARY ANGIOGRAM;  Surgeon: [name], MD;  Location: [facility] CATH LAB;  Service: Cardiovascular;  Laterality: N/A;   TONSILLECTOMY     TUBAL LIGATION      Current Medications: Current Meds  Medication Sig   acetaminophen (TYLENOL) 500 MG tablet Take 2 tablets (1,000 mg total) by mouth every 8 (eight) hours as needed.   APPLE CIDER VINEGAR PO Take 1 capsule by mouth daily.   Biotin 5000 MCG CAPS Take 1 tablet by mouth daily.    Budeson-Glycopyrrol-Formoterol (BREZTRI AEROSPHERE) 160-9-4.8 MCG/ACT AERO Inhale 2 puffs into the lungs in the morning and at bedtime.   carbidopa-levodopa (SINEMET IR) 25-100 MG tablet 2 tablets at 7am, 2 at 11am, 1 tablet at 4pm   ciclopirox (PENLAC) 8 % solution Apply topically at bedtime. Apply over nail and surrounding skin. Apply daily over previous coat. After seven (7) days, may remove with alcohol and continue cycle.   diclofenac Sodium (VOLTAREN) 1 % GEL APPLY 4 GRAMS TOPICALLY FOUR TIMES DAILY   estradiol (ESTRACE) 0.1 MG/GM vaginal cream every other day.   famotidine (PEPCID) 20 MG tablet TAKE 1 TABLET TWICE DAILY   fluticasone (FLONASE SENSIMIST) 27.5 MCG/SPRAY nasal spray Place 2 sprays into the nose daily.   fluticasone (FLONASE) 50 MCG/ACT nasal spray Place 2 sprays into both nostrils daily.   furosemide (LASIX) 20 MG tablet Take 1 tablet (20 mg total) by mouth daily except take 2 tablets (40 mg total) every Monday and Thursday.   gabapentin (NEURONTIN) 100 MG  capsule Take 100 mg by mouth daily.   levalbuterol (XOPENEX) 0.63 MG/3ML nebulizer solution USE 1 VIAL IN NEBULIZER EVERY 4 TO 6 HOURS AS NEEDED FOR WHEEZING AND FOR SHORTNESS OF BREATH   meclizine (ANTIVERT) 25 MG tablet Take 1 tablet (25 mg total) by mouth 3 (three) times daily as needed for dizziness.   melatonin 5 MG TABS Take 5 mg by mouth at bedtime.   metFORMIN (GLUCOPHAGE) 500 MG tablet Take 1 tablet (500 mg total) by mouth daily with breakfast.   metoprolol succinate (TOPROL-XL) 50 MG 24 hr tablet Take 1 tablet (50 mg total) by mouth 2 (two) times daily.   montelukast (SINGULAIR) 10 MG tablet Take 1 tablet (10 mg total) by mouth at bedtime.   OXYGEN Inhale into the lungs  at bedtime. Inhale 2L into lungs   potassium chloride (KLOR-CON) 10 MEQ tablet Take 1 tablet (10 mEq total) by mouth daily.   rosuvastatin (CRESTOR) 10 MG tablet Take 1 tablet (10 mg total) by mouth as directed. Three times a week   tiZANidine (ZANAFLEX) 2 MG tablet TAKE 1 TABLET BY MOUTH EVERY 8 HOURS AS NEEDED FOR MUSCLE SPASM   Ubiquinone (ULTRA COQ10 PO) Take 1 tablet by mouth 3 (three) times a week.     Allergies:   Aspirin, Mirtazapine, Penicillins, Prempro [conj estrog-medroxyprogest ace], Ace inhibitors, and Simvastatin   Social History   Socioeconomic History   Marital status: Married    Spouse name: [name]    Number of children: 2   Years of education: 12   Highest education level: 12th grade  Occupational History   Occupation: retired    Comment: CNA  Tobacco Use   Smoking status: Never    Passive exposure: Past   Smokeless tobacco: Never   Tobacco comments:    exposed to smoke during child hood (parents)  Vaping Use   Vaping Use: Never used  Substance and Sexual Activity   Alcohol use: No    Alcohol/week: 0.0 standard drinks of alcohol   Drug use: No   Sexual activity: Not on file  Other Topics Concern   Not on file  Social History Narrative   Patient lives with her husband [name].    Patient enjoys spending time with her family, cooking, and her 2 dogs.    Patient has one daughter who lives locally, and one son who lives in [place].   Social Determinants of Health   Financial Resource Strain: Low Risk  (07/01/2020)   Overall Financial Resource Strain (CARDIA)    Difficulty of Paying Living Expenses: Not hard at all  Food Insecurity: No Food Insecurity (07/01/2020)   Hunger Vital Sign    Worried About Running Out of Food in the Last Year: Never true    Ran Out of Food in the Last Year: Never true  Transportation Needs: No Transportation Needs (07/01/2020)   PRAPARE - Hydrologist (Medical): No    Lack of Transportation (Non-Medical): No  Physical Activity: Sufficiently Active (07/01/2020)   Exercise Vital Sign    Days of Exercise per Week: 7 days    Minutes of Exercise per Session: 30 min  Stress: No Stress Concern Present (07/01/2020)   [place]    Feeling of Stress : Not at all  Social Connections: Moderately Integrated (07/01/2020)   Social Connection and Isolation Panel [NHANES]    Frequency of Communication with Friends and Family: More than three times a week    Frequency of Social Gatherings with Friends and Family: More than three times a week    Attends Religious Services: More than 4 times per year    Active Member of [employer] or Organizations: No    Attends Music therapist: Never    Marital Status: Married     Family History: The patient's family history includes Coronary artery disease in her father and mother; Diabetes in her father, mother, sister, sister, and sister; Diabetes (age of onset: 57) in her son; Healthy in her daughter; Heart attack in her father and mother; Heart disease in her sister, sister, and sister; Hepatitis C in her sister; Parkinson's disease in her sister. There is no history of Breast cancer.  ROS:   Please see the history  of present illness.     All other systems reviewed and are negative.  EKGs/Labs/Other Studies Reviewed:    The following studies were reviewed today:  EKG:  EKG is  ordered today.  The ekg ordered today demonstrates  09/14/22 SR with LBBB  Cardiac Studies & Procedures     STRESS TESTS  MYOCARDIAL PERFUSION IMAGING 12/29/2016  Narrative  Nuclear stress EF: 49%.  No T wave inversion was noted during stress.  There was no ST segment deviation noted during stress.  Defect 1: There is a large defect of moderate severity.  This is an intermediate risk study.  Large size, moderate intensity fixed inferoseptal/septal perfusion defect consistent with LBBB artifact. No reversible ischemia. LVEF 49% with incoordinate septal motion. This is an intermediate risk study (due to LVEF <50).   ECHOCARDIOGRAM  ECHOCARDIOGRAM COMPLETE 10/18/2020  Narrative ECHOCARDIOGRAM REPORT    Patient Name:   TASMIN ZAHRADKA Date of Exam: 10/18/2020 Medical Rec #:  [id number]          Height:       61.5 in Accession #:    [id number]         Weight:       130.1 lb Date of Birth:  02-14-1951           BSA:          1.583 m Patient Age:    50 years           BP:           122/59 mmHg Patient Gender: F                  HR:           76 bpm. Exam Location:  Inpatient  Procedure: 2D Echo, Cardiac Doppler and Color Doppler  Indications:    Abnormal ECG  History:        Patient has prior history of Echocardiogram examinations, most recent 10/18/2017. CHF, Defibrillator, Signs/Symptoms:Shortness of Breath; Risk Factors:Hypertension, Diabetes and Dyslipidemia. NICM.  Sonographer:    Clayton Lefort RDCS (AE) Referring Phys: [place]   1. Left ventricular ejection fraction, by estimation, is 40 to 45%. The left ventricle has mildly decreased function. The left ventricle demonstrates global hypokinesis. Left ventricular diastolic function could not be evaluated. 2. Right  ventricular systolic function is normal. The right ventricular size is normal. There is normal pulmonary artery systolic pressure. The estimated right ventricular systolic pressure is [id number] mmHg. 3. The mitral valve is normal in structure. No evidence of mitral valve regurgitation. No evidence of mitral stenosis. 4. The aortic valve is normal in structure. Aortic valve regurgitation is not visualized. No aortic stenosis is present. 5. The inferior vena cava is normal in size with greater than 50% respiratory variability, suggesting right atrial pressure of 3 mmHg.  FINDINGS Left Ventricle: Left ventricular ejection fraction, by estimation, is 40 to 45%. The left ventricle has mildly decreased function. The left ventricle demonstrates global hypokinesis. The left ventricular internal cavity size was normal in size. There is no left ventricular hypertrophy. Abnormal (paradoxical) septal motion, consistent with RV pacemaker. Left ventricular diastolic function could not be evaluated due to paced rhythm. Left ventricular diastolic function could not be evaluated. Normal left ventricular filling pressure.  Right Ventricle: The right ventricular size is normal. No increase in right ventricular wall thickness. Right ventricular systolic function is normal. There is normal pulmonary artery systolic pressure. The tricuspid regurgitant velocity is  2.18 m/s, and with an assumed right atrial pressure of 3 mmHg, the estimated right ventricular systolic pressure is [id number] mmHg.  Left Atrium: Left atrial size was normal in size.  Right Atrium: Right atrial size was normal in size.  Pericardium: There is no evidence of pericardial effusion.  Mitral Valve: The mitral valve is normal in structure. No evidence of mitral valve regurgitation. No evidence of mitral valve stenosis.  Tricuspid Valve: The tricuspid valve is normal in structure. Tricuspid valve regurgitation is mild . No evidence of tricuspid  stenosis.  Aortic Valve: The aortic valve is normal in structure. Aortic valve regurgitation is not visualized. No aortic stenosis is present. Aortic valve mean gradient measures 1.0 mmHg. Aortic valve peak gradient measures 3.1 mmHg. Aortic valve area, by VTI measures 3.39 cm.  Pulmonic Valve: The pulmonic valve was normal in structure. Pulmonic valve regurgitation is not visualized. No evidence of pulmonic stenosis.  Aorta: The aortic root is normal in size and structure.  Venous: The inferior vena cava is normal in size with greater than 50% respiratory variability, suggesting right atrial pressure of 3 mmHg.  IAS/Shunts: No atrial level shunt detected by color flow Doppler.  Additional Comments: A device lead is visualized.   LEFT VENTRICLE PLAX 2D LVIDd:         4.20 cm  Diastology LVIDs:         3.10 cm  LV e' medial:    5.44 cm/s LV PW:         1.00 cm  LV E/e' medial:  11.0 LV IVS:        0.80 cm  LV e' lateral:   6.74 cm/s LVOT diam:     2.20 cm  LV E/e' lateral: 8.9 LV SV:         54 LV SV Index:   34 LVOT Area:     3.80 cm   RIGHT VENTRICLE             IVC RV Basal diam:  2.30 cm     IVC diam: 2.00 cm RV S prime:     11.70 cm/s TAPSE (M-mode): 1.9 cm  LEFT ATRIUM             Index       RIGHT ATRIUM          Index LA diam:        1.50 cm 0.95 cm/m  RA Area:     9.49 cm LA Vol (A2C):   22.8 ml 14.41 ml/m RA Volume:   16.60 ml 10.49 ml/m LA Vol (A4C):   28.7 ml 18.13 ml/m LA Biplane Vol: 27.5 ml 17.38 ml/m AORTIC VALVE AV Area (Vmax):    2.60 cm AV Area (Vmean):   2.83 cm AV Area (VTI):     3.39 cm AV Vmax:           87.90 cm/s AV Vmean:          57.300 cm/s AV VTI:            0.159 m AV Peak Grad:      3.1 mmHg AV Mean Grad:      1.0 mmHg LVOT Vmax:         60.10 cm/s LVOT Vmean:        42.600 cm/s LVOT VTI:          0.142 m LVOT/AV VTI ratio: 0.89  AORTA Ao Root diam: 3.10 cm Ao Asc diam:  3.00 cm  MITRAL VALVE               TRICUSPID  VALVE MV Area (PHT): 3.46 cm    TR Peak grad:   19.0 mmHg MV Decel Time: 219 msec    TR Vmax:        218.00 cm/s MV E velocity: 60.00 cm/s MV A velocity: 90.00 cm/s  SHUNTS MV E/A ratio:  0.67        Systemic VTI:  0.14 m Systemic Diam: 2.20 cm  Fransico Him MD Electronically signed by Fransico Him MD Signature Date/Time: 10/18/2020/12:38:48 PM    Final              Recent Labs: 01/06/2022: TSH 1.99 08/15/2022: ALT <5; BUN 15; Creatinine, Ser 1.18; Hemoglobin 14.0; Platelets 227; Potassium 4.1; Sodium 140  Recent Lipid Panel    Component Value Date/Time   CHOL 171 11/15/2021 0915   TRIG 120 11/15/2021 0915   HDL 53 11/15/2021 0915   CHOLHDL 3.2 11/15/2021 0915   CHOLHDL 3.5 05/31/2013 0435   VLDL 28 05/31/2013 0435   LDLCALC 97 11/15/2021 0915   LDLDIRECT 111 (H) 02/10/2015 1145       Physical Exam:    VS:  BP 110/60   Pulse 93   Ht 5' 1"$  (1.549 m)   Wt 111 lb 8 oz (50.6 kg)   LMP 08/01/2000 (Approximate)   SpO2 98%   BMI 21.07 kg/m     Wt Readings from Last 3 Encounters:  09/14/22 111 lb 8 oz (50.6 kg)  08/15/22 118 lb (53.5 kg)  08/05/22 117 lb (53.1 kg)    Gen: no distress   Neck: No JVD in wheelchair Cardiac: No Rubs or Gallops, no murmur, RRR +2 radial pulses Respiratory: Decreased breath sounds in the left lower base, normal effort, normal  respiratory rate GI: Soft, nontender, non-distended  MS: No edema Integument: Skin feels warm Neuro:  At time of evaluation, alert and oriented to person/place/time/situation  Psych: Normal affect, patient feels ok   ASSESSMENT:    1. Chronic systolic heart failure (HCC)   2. 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 NICM (nonischemic cardiomyopathy) ([place])   4. 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 Burt Knack       Medication Adjustments/Labs and Tests Ordered: Current medicines are reviewed at length with the patient today.  Concerns regarding medicines are outlined above.  Orders Placed This Encounter  Procedures   Pro b natriuretic peptide (BNP)   No orders of the defined types were placed in this encounter.   Patient Instructions  Medication Instructions:  Your physician recommends that you continue on your current medications as directed. Please refer to the Current Medication list given to you today.  *If you need a refill on your cardiac medications before your next appointment, please  call your pharmacy*   Lab Work: TODAY: BNP  If you have labs (blood work) drawn today and your tests are completely normal, you will receive your results only by: [place] (if you have MyChart) OR A paper copy in the mail If you have any lab test that is abnormal or we need to change your treatment, we will call you to review the results.   Testing/Procedures: NONE   Follow-Up:As scheduled At [facility], you and your health needs are our priority.  As part of our continuing mission to provide you with exceptional heart care, we have created designated Provider Care Teams.  These Care Teams include your primary Cardiologist (physician) and Advanced Practice Providers (APPs -  Physician Assistants and Nurse Practitioners) who all work together to provide you with the care you need, when you need it.       Signed, Werner Lean, MD  09/14/2022 12:26 PM    [place]

## 2022-09-14 ENCOUNTER — Ambulatory Visit: Payer: Medicare Other | Attending: Internal Medicine | Admitting: Internal Medicine

## 2022-09-14 ENCOUNTER — Encounter: Payer: Self-pay | Admitting: Internal Medicine

## 2022-09-14 VITALS — BP 110/60 | HR 93 | Ht 61.0 in | Wt 111.5 lb

## 2022-09-14 DIAGNOSIS — I428 Other cardiomyopathies: Secondary | ICD-10-CM | POA: Diagnosis not present

## 2022-09-14 DIAGNOSIS — I5020 Unspecified systolic (congestive) heart failure: Secondary | ICD-10-CM

## 2022-09-14 DIAGNOSIS — G20B2 Parkinson's disease with dyskinesia, with fluctuations: Secondary | ICD-10-CM | POA: Insufficient documentation

## 2022-09-14 DIAGNOSIS — I5022 Chronic systolic (congestive) heart failure: Secondary | ICD-10-CM | POA: Insufficient documentation

## 2022-09-14 NOTE — Patient Instructions (Signed)
Medication Instructions:  Your physician recommends that you continue on your current medications as directed. Please refer to the Current Medication list given to you today.  *If you need a refill on your cardiac medications before your next appointment, please call your pharmacy*   Lab Work: TODAY: BNP  If you have labs (blood work) drawn today and your tests are completely normal, you will receive your results only by: [place] (if you have MyChart) OR A paper copy in the mail If you have any lab test that is abnormal or we need to change your treatment, we will call you to review the results.   Testing/Procedures: NONE   Follow-Up:As scheduled At [facility], you and your health needs are our priority.  As part of our continuing mission to provide you with exceptional heart care, we have created designated Provider Care Teams.  These Care Teams include your primary Cardiologist (physician) and Advanced Practice Providers (APPs -  Physician Assistants and Nurse Practitioners) who all work together to provide you with the care you need, when you need it.

## 2022-09-14 NOTE — Progress Notes (Signed)
Remote ICD transmission.   

## 2022-09-15 ENCOUNTER — Telehealth: Payer: Self-pay | Admitting: Internal Medicine

## 2022-09-15 ENCOUNTER — Telehealth: Payer: Self-pay

## 2022-09-15 DIAGNOSIS — Z8781 Personal history of (healed) traumatic fracture: Secondary | ICD-10-CM | POA: Diagnosis not present

## 2022-09-15 DIAGNOSIS — Z9981 Dependence on supplemental oxygen: Secondary | ICD-10-CM | POA: Diagnosis not present

## 2022-09-15 DIAGNOSIS — R300 Dysuria: Secondary | ICD-10-CM

## 2022-09-15 DIAGNOSIS — R519 Headache, unspecified: Secondary | ICD-10-CM | POA: Diagnosis not present

## 2022-09-15 DIAGNOSIS — G20A1 Parkinson's disease without dyskinesia, without mention of fluctuations: Secondary | ICD-10-CM | POA: Diagnosis not present

## 2022-09-15 NOTE — Telephone Encounter (Signed)
Patient stated she is receiving alerts in her MyChart pertaining to new results. Pt.lab results from 2/14 have not resulted yet. Advised patient the clinic will call her when the test results are released. Patient voiced understanding.

## 2022-09-15 NOTE — Telephone Encounter (Signed)
Patient's granddaughter calls nurse line regarding patient experiencing UTI symptoms. She reports that patient is having dysuria and cloudy urine. Symptoms have been present for approx 1.5 weeks. Denies fever, chills or abdominal pain.   Granddaughter reports that patient is bed bound and is asking if she could drop off urine sample.   Forwarding to PCP. If appropriate, please place future orders. We could also schedule patient for a virtual visit after urine sample is dropped off.   Talbot Grumbling, RN

## 2022-09-15 NOTE — Telephone Encounter (Signed)
Patient is calling regarding her lab results from yesterday. She states she keeps getting notifications from [place], but is unable to access it. Please advise.

## 2022-09-16 ENCOUNTER — Telehealth (INDEPENDENT_AMBULATORY_CARE_PROVIDER_SITE_OTHER): Payer: Medicare Other | Admitting: Family Medicine

## 2022-09-16 ENCOUNTER — Telehealth: Payer: Self-pay

## 2022-09-16 ENCOUNTER — Other Ambulatory Visit (INDEPENDENT_AMBULATORY_CARE_PROVIDER_SITE_OTHER): Payer: Medicare Other | Admitting: Student

## 2022-09-16 DIAGNOSIS — R519 Headache, unspecified: Secondary | ICD-10-CM | POA: Diagnosis not present

## 2022-09-16 DIAGNOSIS — R300 Dysuria: Secondary | ICD-10-CM | POA: Diagnosis not present

## 2022-09-16 DIAGNOSIS — Z9981 Dependence on supplemental oxygen: Secondary | ICD-10-CM | POA: Diagnosis not present

## 2022-09-16 DIAGNOSIS — Z8781 Personal history of (healed) traumatic fracture: Secondary | ICD-10-CM | POA: Diagnosis not present

## 2022-09-16 DIAGNOSIS — I5022 Chronic systolic (congestive) heart failure: Secondary | ICD-10-CM

## 2022-09-16 DIAGNOSIS — G20A1 Parkinson's disease without dyskinesia, without mention of fluctuations: Secondary | ICD-10-CM | POA: Diagnosis not present

## 2022-09-16 LAB — PRO B NATRIURETIC PEPTIDE: NT-Pro BNP: 886 pg/mL — ABNORMAL HIGH (ref 0–301)

## 2022-09-16 LAB — POCT URINALYSIS DIP (MANUAL ENTRY)
Bilirubin, UA: NEGATIVE
Blood, UA: NEGATIVE
Glucose, UA: NEGATIVE mg/dL
Ketones, POC UA: NEGATIVE mg/dL
Nitrite, UA: NEGATIVE
Protein Ur, POC: NEGATIVE mg/dL
Spec Grav, UA: 1.01 (ref 1.010–1.025)
Urobilinogen, UA: 0.2 E.U./dL
pH, UA: 6 (ref 5.0–8.0)

## 2022-09-16 LAB — POCT UA - MICROSCOPIC ONLY: RBC, Urine, Miroscopic: NONE SEEN (ref 0–2)

## 2022-09-16 MED ORDER — FUROSEMIDE 20 MG PO TABS: 20.0000 mg | ORAL_TABLET | Freq: Every day | ORAL | 3 refills | Status: DC

## 2022-09-16 NOTE — Telephone Encounter (Signed)
The patient has been notified of the result and verbalized understanding.  All questions (if any) were answered. Precious Gilding, RN 09/16/2022 12:11 PM   Pt will come in for f/u labs on 09/28/22.

## 2022-09-16 NOTE — Telephone Encounter (Signed)
-----   Message from Werner Lean, MD sent at 09/16/2022 11:35 AM EST ----- Results: Elevated BNP with SOB with Parkinson therapy Plan: Increase to lasix 20 mg PO Daily Keep follow up with primary cardiologist  Werner Lean, MD

## 2022-09-16 NOTE — Telephone Encounter (Signed)
Called granddaughter. She will bring in sample today.   Talbot Grumbling, RN

## 2022-09-16 NOTE — Progress Notes (Signed)
[place] Telemedicine Visit  Patient consented to have virtual visit and was identified by name and date of birth. Method of visit: Telephone  Encounter participants: Patient: Veronica Shaw - located at home Provider: Sharion Shaw - located at [facility] Others (if applicable): Veronica Shaw (granddaughter)   Chief Complaint: UTI symptoms  HPI:  Granddaughter Veronica Shaw reports that patient has been complaining of dysuria, frequent urination and cloudy urine.  Symptoms have been ongoing for 1 and half weeks.  PCP ordered UA and Ucx and granddaughter brought to the office today. Granddaughter also notes that Veronica Shaw's heart rate has been in the 120s. VeronicaShaw says that her home PT states that her heart rate was doing better this session. She recently was advised by her cardiologist to take Lasix daily for her elevated BNP and has close follow up with them on 2/28. Veronica Shaw says that her urinary symptoms have improved over the last few days. Denies fevers, nausea, vomiting. No back pain or hematuria. She reports that she has had plenty of UTI's in the past "but this didn't feel as bad".   Per chart review, has history of multidrug-resistant E. coli UTI back in 08/2021.  Resistant to ampicillin, cefazolin, ceftriaxone, cefuroxime, ciprofloxacin, levofloxacin, tetracycline, bactrim.   ROS: per HPI  Pertinent PMHx: NICM, HTN, HFmrEF, GERD, T2DM, parkinson disease  Exam:  LMP 08/01/2000 (Approximate)   Respiratory: Normal effort Neuro: speech is clear  Assessment/Plan:  1. Dysuria Will need to f/u Ucx and sensitivities if true UTI given hx of multi-resistant organism. Reassuringly symptoms seem to have improved. No symptoms to suggest pyelonephritis.  -F/u Ucx, treat as appropriate   Time spent during visit with patient: 6.5 minutes

## 2022-09-16 NOTE — Telephone Encounter (Signed)
Granddaughter has just picked up sample and will drop off at our office this afternoon.   Scheduled for virtual with Dr. Nita Sells for this afternoon.   Granddaughter will attempt to obtain VS prior to this appointment.   Talbot Grumbling, RN

## 2022-09-19 ENCOUNTER — Telehealth: Payer: Self-pay

## 2022-09-19 LAB — URINE CULTURE

## 2022-09-19 NOTE — Progress Notes (Signed)
COMMUNITY PALLIATIVE CARE SW NOTE  PATIENT NAME: Veronica Shaw DOB: 1951/07/25 MRN: [id number]  PRIMARY CARE PROVIDER: Gerrit Heck, MD  RESPONSIBLE PARTY:  Acct ID - Guarantor Home Phone Work Phone Relationship Acct Type  [id number] Sheliah [id number]  Self P/F     [place] [place], [place], [place] [id number]   Social Work Encounter  PC SW completed a encounter with patient and her granddaughter-E. Cowan and patient's husband-Robert at her home. Education was provided regarding palliative care services, role in patient's care and visit frequency. Patient and granddaughter verbalized understanding and signed consent to services. Patient report that she has neuropathy and is constant pain. She reports SOB with exertion and was observed as patient talked. She reports that her SOB is worse at night and evening. Patient report that she wears o2 at night at 2L via nasal cannula. She was ambulating with a rollator walker, but she is no longer ambulatory. Patient's husband uses the rollator to push her in like a wheelchair. She report her appetite has decreased, but she is eating 2-3 small meals per day and a snack. She has had several falls (6 in the past three months).  Patient has constant pain from her neck due to an old fracture that she manages with Tylenol. Patient report that this is not working and needs more assistance. Patient receives PT/OT through [facility]. Patient has Medicare and [place].   SW provided education regarding advance directives and left HCPOA/Living Will information and MOST form in the home for patient to review further. Patient desires to be a Full Code.     SOCIAL HX: Patient is from [place]. She worked as a Psychologist, counselling. She has been married for 35 years.  Social History   Tobacco Use   Smoking status: Never    Passive exposure: Past   Smokeless tobacco: Never   Tobacco comments:    exposed to smoke during child hood  (parents)  Substance Use Topics   Alcohol use: No    Alcohol/week: 0.0 standard drinks of alcohol    CODE STATUS: Full Code ADVANCED DIRECTIVES: Educated MOST FORM COMPLETE:  Educated HOSPICE EDUCATION PROVIDED: No  Duration of visit and documentation: 60 minutes  [employer], LCSW

## 2022-09-19 NOTE — Addendum Note (Signed)
Addended by: Jeremy Johann on: 09/19/2022 04:21 PM   Modules accepted: Orders

## 2022-09-19 NOTE — Telephone Encounter (Signed)
Received call from [facility] at [facility] regarding patient. She is requesting orders for patient to be transferred from palliative program and evaluated for Hospice services.   Please advise if verbal orders for hospice are appropriate.   Talbot Grumbling, RN

## 2022-09-20 NOTE — Telephone Encounter (Signed)
Verbal orders given.   Talbot Grumbling, RN

## 2022-09-21 ENCOUNTER — Emergency Department (HOSPITAL_COMMUNITY): Payer: Medicare Other

## 2022-09-21 ENCOUNTER — Other Ambulatory Visit: Payer: Self-pay

## 2022-09-21 ENCOUNTER — Encounter (HOSPITAL_COMMUNITY): Payer: Self-pay

## 2022-09-21 ENCOUNTER — Observation Stay (HOSPITAL_COMMUNITY)
Admission: EM | Admit: 2022-09-21 | Discharge: 2022-09-23 | Disposition: A | Discharge status: Home Health | Payer: Medicare Other | Attending: Family Medicine | Admitting: Family Medicine

## 2022-09-21 DIAGNOSIS — Z1152 Encounter for screening for COVID-19: Secondary | ICD-10-CM | POA: Diagnosis not present

## 2022-09-21 DIAGNOSIS — R404 Transient alteration of awareness: Secondary | ICD-10-CM | POA: Diagnosis not present

## 2022-09-21 DIAGNOSIS — Z7984 Long term (current) use of oral hypoglycemic drugs: Secondary | ICD-10-CM | POA: Insufficient documentation

## 2022-09-21 DIAGNOSIS — I959 Hypotension, unspecified: Secondary | ICD-10-CM | POA: Diagnosis not present

## 2022-09-21 DIAGNOSIS — Z743 Need for continuous supervision: Secondary | ICD-10-CM | POA: Diagnosis not present

## 2022-09-21 DIAGNOSIS — Z7952 Long term (current) use of systemic steroids: Secondary | ICD-10-CM | POA: Diagnosis not present

## 2022-09-21 DIAGNOSIS — I447 Left bundle-branch block, unspecified: Secondary | ICD-10-CM | POA: Diagnosis not present

## 2022-09-21 DIAGNOSIS — E119 Type 2 diabetes mellitus without complications: Secondary | ICD-10-CM | POA: Diagnosis not present

## 2022-09-21 DIAGNOSIS — J45909 Unspecified asthma, uncomplicated: Secondary | ICD-10-CM | POA: Diagnosis not present

## 2022-09-21 DIAGNOSIS — R6889 Other general symptoms and signs: Secondary | ICD-10-CM | POA: Diagnosis not present

## 2022-09-21 DIAGNOSIS — R0902 Hypoxemia: Secondary | ICD-10-CM | POA: Insufficient documentation

## 2022-09-21 DIAGNOSIS — I110 Hypertensive heart disease with heart failure: Secondary | ICD-10-CM | POA: Diagnosis not present

## 2022-09-21 DIAGNOSIS — R9431 Abnormal electrocardiogram [ECG] [EKG]: Secondary | ICD-10-CM

## 2022-09-21 DIAGNOSIS — G20C Parkinsonism, unspecified: Secondary | ICD-10-CM | POA: Diagnosis not present

## 2022-09-21 DIAGNOSIS — G4734 Idiopathic sleep related nonobstructive alveolar hypoventilation: Secondary | ICD-10-CM

## 2022-09-21 DIAGNOSIS — G4489 Other headache syndrome: Secondary | ICD-10-CM | POA: Diagnosis not present

## 2022-09-21 DIAGNOSIS — R55 Syncope and collapse: Secondary | ICD-10-CM | POA: Diagnosis not present

## 2022-09-21 DIAGNOSIS — I5020 Unspecified systolic (congestive) heart failure: Secondary | ICD-10-CM | POA: Diagnosis not present

## 2022-09-21 DIAGNOSIS — R000 Tachycardia, unspecified: Secondary | ICD-10-CM | POA: Diagnosis not present

## 2022-09-21 DIAGNOSIS — Z79899 Other long term (current) drug therapy: Secondary | ICD-10-CM | POA: Diagnosis not present

## 2022-09-21 DIAGNOSIS — E118 Type 2 diabetes mellitus with unspecified complications: Secondary | ICD-10-CM | POA: Diagnosis present

## 2022-09-21 LAB — GLUCOSE, CAPILLARY: Glucose-Capillary: 129 mg/dL — ABNORMAL HIGH (ref 70–99)

## 2022-09-21 LAB — URINALYSIS, ROUTINE W REFLEX MICROSCOPIC
Bilirubin Urine: NEGATIVE
Glucose, UA: NEGATIVE mg/dL
Hgb urine dipstick: NEGATIVE
Ketones, ur: 5 mg/dL — AB
Leukocytes,Ua: NEGATIVE
Nitrite: NEGATIVE
Protein, ur: NEGATIVE mg/dL
Specific Gravity, Urine: 1.011 (ref 1.005–1.030)
pH: 5 (ref 5.0–8.0)

## 2022-09-21 LAB — TROPONIN I (HIGH SENSITIVITY)
Troponin I (High Sensitivity): 7 ng/L (ref <18)
Troponin I (High Sensitivity): 9 ng/L (ref <18)

## 2022-09-21 LAB — BASIC METABOLIC PANEL
Anion gap: 15 (ref 5–15)
BUN: 14 mg/dL (ref 8–23)
CO2: 22 mmol/L (ref 22–32)
Calcium: 9.9 mg/dL (ref 8.9–10.3)
Chloride: 99 mmol/L (ref 98–111)
Creatinine, Ser: 1.02 mg/dL — ABNORMAL HIGH (ref 0.44–1.00)
GFR, Estimated: 58 mL/min — ABNORMAL LOW (ref >60)
Glucose, Bld: 165 mg/dL — ABNORMAL HIGH (ref 70–99)
Potassium: 4.1 mmol/L (ref 3.5–5.1)
Sodium: 136 mmol/L (ref 135–145)

## 2022-09-21 LAB — RESP PANEL BY RT-PCR (RSV, FLU A&B, COVID)  RVPGX2
Influenza A by PCR: NEGATIVE
Influenza B by PCR: NEGATIVE
Resp Syncytial Virus by PCR: NEGATIVE
SARS Coronavirus 2 by RT PCR: NEGATIVE

## 2022-09-21 LAB — CBC WITH DIFFERENTIAL/PLATELET
Abs Immature Granulocytes: 0.01 10*3/uL (ref 0.00–0.07)
Basophils Absolute: 0.1 10*3/uL (ref 0.0–0.1)
Basophils Relative: 1 %
Eosinophils Absolute: 0.4 10*3/uL (ref 0.0–0.5)
Eosinophils Relative: 5 %
HCT: 39.6 % (ref 36.0–46.0)
Hemoglobin: 13.4 g/dL (ref 12.0–15.0)
Immature Granulocytes: 0 %
Lymphocytes Relative: 21 %
Lymphs Abs: 1.9 10*3/uL (ref 0.7–4.0)
MCH: 30.7 pg (ref 26.0–34.0)
MCHC: 33.8 g/dL (ref 30.0–36.0)
MCV: 90.8 fL (ref 80.0–100.0)
Monocytes Absolute: 0.8 10*3/uL (ref 0.1–1.0)
Monocytes Relative: 9 %
Neutro Abs: 5.8 10*3/uL (ref 1.7–7.7)
Neutrophils Relative %: 64 %
Platelets: 191 10*3/uL (ref 150–400)
RBC: 4.36 MIL/uL (ref 3.87–5.11)
RDW: 11.7 % (ref 11.5–15.5)
WBC: 8.9 10*3/uL (ref 4.0–10.5)
nRBC: 0 % (ref 0.0–0.2)

## 2022-09-21 LAB — MAGNESIUM: Magnesium: 1.5 mg/dL — ABNORMAL LOW (ref 1.7–2.4)

## 2022-09-21 LAB — CBG MONITORING, ED: Glucose-Capillary: 189 mg/dL — ABNORMAL HIGH (ref 70–99)

## 2022-09-21 MED ORDER — SODIUM CHLORIDE 0.9 % IV SOLN: INTRAVENOUS | Status: DC

## 2022-09-21 MED ORDER — ROSUVASTATIN CALCIUM 5 MG PO TABS
10.0000 mg | ORAL_TABLET | ORAL | Status: DC
  Administered 2022-09-21: 10 mg via ORAL
  Filled 2022-09-21 (×2): qty 2

## 2022-09-21 MED ORDER — UMECLIDINIUM BROMIDE 62.5 MCG/ACT IN AEPB
1.0000 | INHALATION_SPRAY | Freq: Every day | RESPIRATORY_TRACT | Status: DC
  Administered 2022-09-22: 1 via RESPIRATORY_TRACT
  Filled 2022-09-21: qty 7

## 2022-09-21 MED ORDER — MONTELUKAST SODIUM 10 MG PO TABS
10.0000 mg | ORAL_TABLET | Freq: Every day | ORAL | Status: DC
  Administered 2022-09-21 – 2022-09-22 (×2): 10 mg via ORAL
  Filled 2022-09-21 (×2): qty 1

## 2022-09-21 MED ORDER — ROSUVASTATIN CALCIUM 5 MG PO TABS: 10.0000 mg | ORAL_TABLET | ORAL | Status: DC

## 2022-09-21 MED ORDER — CARBIDOPA-LEVODOPA 25-100 MG PO TABS: 1.0000 | ORAL_TABLET | Freq: Three times a day (TID) | ORAL | Status: DC

## 2022-09-21 MED ORDER — INSULIN ASPART 100 UNIT/ML IJ SOLN
0.0000 [IU] | Freq: Three times a day (TID) | INTRAMUSCULAR | Status: DC
  Administered 2022-09-22 (×2): 3 [IU] via SUBCUTANEOUS
  Administered 2022-09-23: 1 [IU] via SUBCUTANEOUS

## 2022-09-21 MED ORDER — SODIUM CHLORIDE 0.9 % IV BOLUS
500.0000 mL | Freq: Once | INTRAVENOUS | Status: AC
  Administered 2022-09-21: 500 mL via INTRAVENOUS

## 2022-09-21 MED ORDER — GABAPENTIN 100 MG PO CAPS
100.0000 mg | ORAL_CAPSULE | Freq: Every day | ORAL | Status: DC
  Administered 2022-09-21 – 2022-09-23 (×3): 100 mg via ORAL
  Filled 2022-09-21 (×3): qty 1

## 2022-09-21 MED ORDER — ENOXAPARIN SODIUM 40 MG/0.4ML IJ SOSY
40.0000 mg | PREFILLED_SYRINGE | INTRAMUSCULAR | Status: DC
  Administered 2022-09-21 – 2022-09-22 (×2): 40 mg via SUBCUTANEOUS
  Filled 2022-09-21 (×2): qty 0.4

## 2022-09-21 MED ORDER — CARBIDOPA-LEVODOPA 25-100 MG PO TABS
1.0000 | ORAL_TABLET | Freq: Every day | ORAL | Status: DC
  Administered 2022-09-21 – 2022-09-22 (×2): 1 via ORAL
  Filled 2022-09-21 (×2): qty 1

## 2022-09-21 MED ORDER — BUDESON-GLYCOPYRROL-FORMOTEROL 160-9-4.8 MCG/ACT IN AERO: 2.0000 | INHALATION_SPRAY | Freq: Two times a day (BID) | RESPIRATORY_TRACT | Status: DC

## 2022-09-21 MED ORDER — ACETAMINOPHEN 500 MG PO TABS: 1000.0000 mg | ORAL_TABLET | Freq: Three times a day (TID) | ORAL | Status: DC | PRN

## 2022-09-21 MED ORDER — MAGNESIUM SULFATE 2 GM/50ML IV SOLN
2.0000 g | Freq: Once | INTRAVENOUS | Status: AC
  Administered 2022-09-21: 2 g via INTRAVENOUS
  Filled 2022-09-21: qty 50

## 2022-09-21 MED ORDER — MELATONIN 5 MG PO TABS
10.0000 mg | ORAL_TABLET | Freq: Every evening | ORAL | Status: DC | PRN
  Administered 2022-09-21 – 2022-09-22 (×2): 10 mg via ORAL
  Filled 2022-09-21 (×2): qty 2

## 2022-09-21 MED ORDER — FAMOTIDINE 20 MG PO TABS
20.0000 mg | ORAL_TABLET | Freq: Every day | ORAL | Status: DC
  Administered 2022-09-22 – 2022-09-23 (×2): 20 mg via ORAL
  Filled 2022-09-21 (×2): qty 1

## 2022-09-21 MED ORDER — CARBIDOPA-LEVODOPA 25-100 MG PO TABS
2.0000 | ORAL_TABLET | Freq: Every day | ORAL | Status: DC
  Administered 2022-09-22: 2 via ORAL
  Filled 2022-09-21 (×2): qty 2

## 2022-09-21 MED ORDER — FLUTICASONE FUROATE-VILANTEROL 200-25 MCG/ACT IN AEPB
1.0000 | INHALATION_SPRAY | Freq: Every day | RESPIRATORY_TRACT | Status: DC
  Administered 2022-09-22: 1 via RESPIRATORY_TRACT
  Filled 2022-09-21: qty 28

## 2022-09-21 MED ORDER — ACETAMINOPHEN 500 MG PO TABS
1000.0000 mg | ORAL_TABLET | ORAL | Status: AC
  Administered 2022-09-21: 1000 mg via ORAL
  Filled 2022-09-21: qty 2

## 2022-09-21 MED ORDER — ACETAMINOPHEN 650 MG RE SUPP: 650.0000 mg | Freq: Four times a day (QID) | RECTAL | Status: DC | PRN

## 2022-09-21 MED ORDER — CARBIDOPA-LEVODOPA 25-100 MG PO TABS
2.0000 | ORAL_TABLET | ORAL | Status: AC
  Administered 2022-09-21: 2 via ORAL
  Filled 2022-09-21: qty 2

## 2022-09-21 MED ORDER — ACETAMINOPHEN 325 MG PO TABS
650.0000 mg | ORAL_TABLET | Freq: Four times a day (QID) | ORAL | Status: DC | PRN
  Administered 2022-09-22 – 2022-09-23 (×2): 650 mg via ORAL
  Filled 2022-09-21 (×2): qty 2

## 2022-09-21 MED ORDER — ACETAMINOPHEN 325 MG PO TABS: 650.0000 mg | ORAL_TABLET | Freq: Four times a day (QID) | ORAL | Status: DC | PRN

## 2022-09-21 MED ORDER — CARBIDOPA-LEVODOPA 25-100 MG PO TABS
2.0000 | ORAL_TABLET | Freq: Every morning | ORAL | Status: DC
  Administered 2022-09-22 – 2022-09-23 (×2): 2 via ORAL
  Filled 2022-09-21 (×2): qty 2

## 2022-09-21 NOTE — Hospital Course (Addendum)
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 Her hospital course is detailed below:  Syncope Patient had a witnessed syncopal episode that was preceded by SOB and dizziness prior to event while seated in wheelchair. Unknown etiology at this time. CT head negative. While hospitalized, patient was given fluids, echo obtained which showed EF 45 - 50% and left ventricle demonstrating global hypokinesis. At discharge patient was hemodynamically stable.  Sinus tachycardia Rates on 100 - 110s on initial exam. Likely attributable to dehydration. Patient given fluids as above. Metoprolol was held due to hypotensive episodes.  Other chronic conditions were medically managed with home medications and formulary alternatives as necessary (T2DM, Parkinson disease, HLD)  PCP Follow-up Recommendations:  Regular follow-up

## 2022-09-21 NOTE — ED Provider Notes (Signed)
[facility] Provider Note   CSN: [id number] Arrival date & time: 09/21/22  1023     History  Chief Complaint  Patient presents with   Loss of Consciousness    Veronica Shaw is a 72 y.o. female with a PMHx of DM, HTN, Parkinson disease who presents to the ED with concerns for syncopal episode PTA. Pt notes that she felt lightheaded and was woken up by her husband nudging her to wake up. Pt husband assisted patient to the couch. Pt didn't hit her head. Notes associated generalized weakness, lightheadedness, headaches. No meds tried PTA. Denies chest pain, abdominal pain, nausea, vomiting, dizziness, vision changes, numbness, tingling, hitting her head, rhinorrhea, nasal congestion, cough, sneezing.   The history is provided by the patient. No language interpreter was used.       Home Medications Prior to Admission medications   Medication Sig Start Date End Date Taking? Authorizing Provider  acetaminophen (TYLENOL) 500 MG tablet Take 2 tablets (1,000 mg total) by mouth every 8 (eight) hours as needed. 01/29/21   Brimage, Ronnette Juniper, DO  APPLE CIDER VINEGAR PO Take 1 capsule by mouth daily.    [provider]  Biotin 5000 MCG CAPS Take 1 tablet by mouth daily.     [provider]  Budeson-Glycopyrrol-Formoterol (BREZTRI AEROSPHERE) 160-9-4.8 MCG/ACT AERO Inhale 2 puffs into the lungs in the morning and at bedtime. 09/02/22 10/02/22  Marshell Garfinkel, MD  carbidopa-levodopa (SINEMET IR) 25-100 MG tablet 2 tablets at 7am, 2 at 11am, 1 tablet at 4pm 06/21/22   Tat, Rebecca S, DO  ciclopirox (PENLAC) 8 % solution Apply topically at bedtime. Apply over nail and surrounding skin. Apply daily over previous coat. After seven (7) days, may remove with alcohol and continue cycle. 07/04/22   McDonald, Stephan Minister, DPM  diclofenac Sodium (VOLTAREN) 1 % GEL APPLY 4 GRAMS TOPICALLY FOUR TIMES DAILY 04/28/22   Gerrit Heck, MD  estradiol (ESTRACE)  0.1 MG/GM vaginal cream every other day. 10/27/21   [provider]  famotidine (PEPCID) 20 MG tablet TAKE 1 TABLET TWICE DAILY 06/17/19   Caroline More, DO  fluticasone (FLONASE SENSIMIST) 27.5 MCG/SPRAY nasal spray Place 2 sprays into the nose daily. 12/17/20   Parrett, Fonnie Mu, NP  fluticasone (FLONASE) 50 MCG/ACT nasal spray Place 2 sprays into both nostrils daily. 03/29/22   Dameron, Luna Fuse, DO  furosemide (LASIX) 20 MG tablet Take 1 tablet (20 mg total) by mouth daily. 09/16/22   Sherren Mocha, MD  gabapentin (NEURONTIN) 100 MG capsule Take 100 mg by mouth daily. 09/03/21   [provider]  levalbuterol (XOPENEX) 0.63 MG/3ML nebulizer solution USE 1 VIAL IN NEBULIZER EVERY 4 TO 6 HOURS AS NEEDED FOR WHEEZING AND FOR SHORTNESS OF BREATH 10/14/20   Mannam, Hart Robinsons, MD  meclizine (ANTIVERT) 25 MG tablet Take 1 tablet (25 mg total) by mouth 3 (three) times daily as needed for dizziness. 03/19/22   Varney Biles, MD  melatonin 5 MG TABS Take 5 mg by mouth at bedtime.    [provider]  metFORMIN (GLUCOPHAGE) 500 MG tablet Take 1 tablet (500 mg total) by mouth daily with breakfast. 07/07/22   Shamleffer, Melanie Crazier, MD  metoprolol succinate (TOPROL-XL) 50 MG 24 hr tablet Take 1 tablet (50 mg total) by mouth 2 (two) times daily. 07/21/21   [name], MD  montelukast (SINGULAIR) 10 MG tablet Take 1 tablet (10 mg total) by mouth at bedtime. 06/01/21   Mannam,  Praveen, MD  OXYGEN Inhale into the lungs at bedtime. Inhale 2L into lungs    [provider]  potassium chloride (KLOR-CON) 10 MEQ tablet Take 1 tablet (10 mEq total) by mouth daily. 11/04/21   Sherren Mocha, MD  rosuvastatin (CRESTOR) 10 MG tablet Take 1 tablet (10 mg total) by mouth as directed. Three times a week 07/07/22   Shamleffer, Melanie Crazier, MD  tiZANidine (ZANAFLEX) 2 MG tablet TAKE 1 TABLET BY MOUTH EVERY 8 HOURS AS NEEDED FOR MUSCLE SPASM 03/25/21   Brimage, Ronnette Juniper, DO  Ubiquinone (ULTRA  COQ10 PO) Take 1 tablet by mouth 3 (three) times a week.    [provider]      Allergies    Aspirin, Mirtazapine, Penicillins, Prempro [conj estrog-medroxyprogest ace], Ace inhibitors, and Simvastatin    Review of Systems   Review of Systems  All other systems reviewed and are negative.   Physical Exam Updated Vital Signs BP (!) 121/54   Pulse 91   Temp 97.8 F (36.6 C) (Oral)   Resp 17   Ht 5' 1"$  (1.549 m)   Wt 49.9 kg   LMP 08/01/2000 (Approximate)   BMI 20.78 kg/m  Physical Exam Vitals and nursing note reviewed.  Constitutional:      General: She is not in acute distress.    Appearance: She is not diaphoretic.  HENT:     Head: Normocephalic and atraumatic.     Mouth/Throat:     Pharynx: No oropharyngeal exudate.  Eyes:     General: No scleral icterus.    Conjunctiva/sclera: Conjunctivae normal.  Cardiovascular:     Rate and Rhythm: Normal rate and regular rhythm.     Pulses: Normal pulses.     Heart sounds: Normal heart sounds.  Pulmonary:     Effort: Pulmonary effort is normal. No respiratory distress.     Breath sounds: Normal breath sounds. No wheezing.  Abdominal:     General: Bowel sounds are normal.     Palpations: Abdomen is soft. There is no mass.     Tenderness: There is no abdominal tenderness. There is no guarding or rebound.  Musculoskeletal:        General: Normal range of motion.     Cervical back: Normal range of motion and neck supple.  Skin:    General: Skin is warm and dry.  Neurological:     Mental Status: She is alert.     Comments: No focal neurological deficits. Negative pronator drift. Strength and sensation intact to BLE. decreased strength noted to left upper extremity.  Unable to assess pronator drift due to patient's history of Parkinson's.  Strength sensation intact to bilateral lower extremities.   Psychiatric:        Behavior: Behavior normal.     ED Results / Procedures / Treatments   Labs (all labs ordered are  listed, but only abnormal results are displayed) Labs Reviewed  BASIC METABOLIC PANEL - Abnormal; Notable for the following components:      Result Value   Glucose, Bld 165 (*)    Creatinine, Ser 1.02 (*)    GFR, Estimated 58 (*)    All other components within normal limits  URINALYSIS, ROUTINE W REFLEX MICROSCOPIC - Abnormal; Notable for the following components:   Ketones, ur 5 (*)    All other components within normal limits  MAGNESIUM - Abnormal; Notable for the following components:   Magnesium 1.5 (*)    All other components within normal limits  CBG  MONITORING, ED - Abnormal; Notable for the following components:   Glucose-Capillary 189 (*)    All other components within normal limits  RESP PANEL BY RT-PCR (RSV, FLU A&B, COVID)  RVPGX2  CBC WITH DIFFERENTIAL/PLATELET  TROPONIN I (HIGH SENSITIVITY)  TROPONIN I (HIGH SENSITIVITY)    EKG EKG Interpretation  Date/Time:  Wednesday September 21 2022 10:27:31 EST Ventricular Rate:  86 PR Interval:  149 QRS Duration: 136 QT Interval:  420 QTC Calculation: 503 R Axis:   61 Text Interpretation: Sinus rhythm Lateral infarct, old Prolonged QT interval Confirmed by Margaretmary Eddy [phone]) on 09/21/2022 10:46:38 AM  Radiology CT Head Wo Contrast  Result Date: 09/21/2022 CLINICAL DATA:  Syncope/presyncope, cerebrovascular cause suspected EXAM: CT HEAD WITHOUT CONTRAST TECHNIQUE: Contiguous axial images were obtained from the base of the skull through the vertex without intravenous contrast. RADIATION DOSE REDUCTION: This exam was performed according to the departmental dose-optimization program which includes automated exposure control, adjustment of the mA and/or kV according to patient size and/or use of iterative reconstruction technique. COMPARISON:  08/15/2022 FINDINGS: Brain: No evidence of acute infarction, hemorrhage, hydrocephalus, extra-axial collection or mass lesion/mass effect. Vascular: Atherosclerotic calcifications  involving the large vessels of the skull base. No unexpected hyperdense vessel. Skull: Normal. Negative for fracture or focal lesion. Sinuses/Orbits: Mucosal thickening of the ethmoid air cells. No acute abnormality. Other: Negative for scalp hematoma. IMPRESSION: 1. No acute intracranial abnormality. 2. Mild paranasal sinus disease. Electronically Signed   By: Davina Poke D.O.   On: 09/21/2022 11:47    Procedures Procedures    Medications Ordered in ED Medications  sodium chloride 0.9 % bolus 500 mL (0 mLs Intravenous Stopped 09/21/22 1349)  magnesium sulfate IVPB 2 g 50 mL (0 g Intravenous Stopped 09/21/22 1349)  acetaminophen (TYLENOL) tablet 1,000 mg (1,000 mg Oral Given 09/21/22 1226)  carbidopa-levodopa (SINEMET IR) 25-100 MG per tablet immediate release 2 tablet (2 tablets Oral Given 09/21/22 1226)    ED Course/ Medical Decision Making/ A&P Clinical Course as of 09/21/22 1522  Wed Sep 21, 2022  1143 Magnesium(!): 1.5 [SB]  1333 Re-evaluated and resting comfortably on the stretcher.  Patient notes that she still feels weak at this time. [SB]  1438 Patient reevaluated and updated on probable plan of admission at this time.  Patient agreeable at this time.  Patient notes she still feels generalized weakness. [SB]    Clinical Course User Index [SB] Isaak Delmundo A, PA-C                             Medical Decision Making Amount and/or Complexity of Data Reviewed Labs: ordered. Decision-making details documented in ED Course. Radiology: ordered. ECG/medicine tests: ordered.  Risk OTC drugs. Prescription drug management.   Pt presents with concern for syncopal episode onset prior to arrival.  Patient felt lightheaded prior to her syncopal episode.  Did not hit her head.  On exam patient with decreased strength noted to left upper extremity.  Unable to assess pronator drift due to patient's history of Parkinson's.  Strength sensation intact to bilateral lower extremities.  No  acute cardiovascular, respiratory, down exam findings.  Differential diagnosis includes hypoglycemia, arrhythmia, electrolyte abnormality, intracranial abnormality, CVA, TIA.   Co morbidities that complicate the patient evaluation: Diabetes, hypertension, Parkinson's  Labs:  I ordered, and personally interpreted labs.  The pertinent results include:   COVID, flu, RSV swab ordered results pending at time of signout. Urine ordered with  results pending at time of signout. Initial troponin at 7 delta troponin at 9. Magnesium at 1.5, repleted in the emergency department. CBG at 189. BMP with slightly elevated creatinine 1.02, GFR 58, stable from previous. CBC unremarkable   Imaging: I ordered imaging studies including CT head without I independently visualized and interpreted imaging which showed: no acute findings I agree with the radiologist interpretation  Medications:  I ordered medication including IVF and Tylenol for symptom management Reevaluation of the patient after these medicines and interventions, I reevaluated the patient and found that they have improved I have reviewed the patients home medicines and have made adjustments as needed  Patient case discussed with Domenic Moras, PA-C at sign-out. Plan at sign-out is pending UA, likely Admission to the hospital, however, plans may change as per oncoming team. Patient care transferred at sign out.    This chart was dictated using voice recognition software, Dragon. Despite the best efforts of this provider to proofread and correct errors, errors may still occur which can change documentation meaning.  Final Clinical Impression(s) / ED Diagnoses Final diagnoses:  Syncope and collapse    Rx / DC Orders ED Discharge Orders     None         Jaymin Waln A, PA-C 09/21/22 1523    Fransico Meadow, MD 09/21/22 [phone]

## 2022-09-21 NOTE — Assessment & Plan Note (Addendum)
Uses oxygen 2L at night time and sometimes when she needs it right after waking up. Seen by Pulm 2023 PSG not related to OSA. -Continue nightly 2L oxygen support and prn

## 2022-09-21 NOTE — Assessment & Plan Note (Addendum)
Takes Metformin 531m daily and Glipizide 2.562mat home. Last A1c 5.7 (07/2022) -sSSI -Recommend stopping glipizide upon d/c

## 2022-09-21 NOTE — Progress Notes (Signed)
[facility])  Plan was for patient to be admitted at home with [facility] hospice services. However, patient was transferred to the ED prior to admission.    Patient is not an active patient with [facility] services. [facility] will continue to follow for any discharge planning needs and to coordinate continuation of hospice care.     Please call with any questions/concerns.    Thank you for the opportunity to participate in this patient's care.   Phillis Haggis, MSW [facility] Liaison  [phone]

## 2022-09-21 NOTE — ED Notes (Signed)
ED TO INPATIENT HANDOFF REPORT  ED Nurse Name and Phone #: [phone] Graciella Freer Name/Age/Gender Veronica Shaw 72 y.o. female Room/Bed: 041C/041C  Code Status   Code Status: Full Code  Home/SNF/Other Home Patient oriented to: self, place, time, and situation Is this baseline? Yes      Chief Complaint Syncope [R55]  Triage Note Pt BIB by [facility], family called for syncopal episode, LOC noted.  Did not fall, was on the couch.    Co weakness and a headache   110/56 HR 88 16 resp 96% RA CBG 234 12 lead Left Bundle   Allergies Allergies  Allergen Reactions   Aspirin Swelling and Other (See Comments)    Other reaction(s): Other (See Comments) Causes nose bleeds *ONLY THE COATED ASA* Causes nose bleeds *ONLY THE COATED ASA*   Mirtazapine Other (See Comments)    Hulucinations, sucidal thoughts   Penicillins Shortness Of Breath and Rash    Shortness of Breath - Throat felt like it was closing.    Prempro [Conj Estrog-Medroxyprogest Ace] Shortness Of Breath    Throat swelling Throat swelling   Ace Inhibitors Cough   Simvastatin Other (See Comments) and Rash    Muscle aches    Level of Care/Admitting Diagnosis ED Disposition     ED Disposition  Admit   Condition  --   Comment  Hospital Area: [facility] [[id number]]  Level of Care: Telemetry Medical [104]  May place patient in observation at [facility] or [facility] if equivalent level of care is available:: No  Covid Evaluation: Asymptomatic - no recent exposure (last 10 days) testing not required  Diagnosis: Syncope [206001]  Admitting Physician: NEAL, [place]  Attending Physician: Dickie La [[id number]]          B Medical/Surgery History Past Medical History:  Diagnosis Date   Acute on chronic systolic CHF (congestive heart failure), NYHA class 4 ([place]) 10/19/2020   Asthma    Back pain 09/18/2019   Cervical disc disorder with radiculopathy of cervical region 09/24/2018   Chronic  systolic CHF (congestive heart failure) ([place])    a. cMRI 4/15: EF 34% and findings - c/w NICM, normal RV size and function (RVEF 61%), Mild MR // b. Echo 2/15:  EF 30-35%, diff HK, ant-sept AK, Gr 2 DD, mild MR, trivial TR  //  c. Echo 5/17: EF 20-25%, severe diffuse HK, marked systolic dyssynchrony, grade 1 diastolic dysfunction, mild MR  //  d. [facility] 5/17: Fick CO 2.9, RVSP 19, PASP 15, PW mean 2, low filing pressures and preserved CO    Cognitive impairment 10/19/2017   MOCA was administered with a score of 23/30   Diabetes mellitus    Diabetic peripheral neuropathy ([place]) 12/09/2011   Dyspnea 09/19/2012   Flu 10/17/2017   Gastritis    History of echocardiogram    Echo 6/18: EF 30-35, diffuse HK, grade 1 diastolic dysfunction, trivial MR, mild LAE, mild TR, no pericardial effusion   History of nuclear stress test    Myoview 5/18: EF 49, no ischemia, inferoseptal defect c/w LBBB artifact (intermediate risk due to EF < 50).   HTN (hypertension)    Hyperlipidemia    Hyperlipidemia associated with type 2 diabetes mellitus ([place]) 03/05/2019   Hypertension associated with diabetes ([place]) 09/28/2006   Qualifier: Diagnosis of  By: [name]     Major depression 03/31/2013   Melena 08/21/2020   NICM (nonischemic cardiomyopathy) ([place])    a. Nuclear 5/13:  Normal stress nuclear study. LV Ejection Fraction: 58%  //  b. LHC 10/14: Minor luminal irregularity in prox LAD, EF 35%    Parkinson disease    Plantar fasciitis    Plantar fasciitis, bilateral 06/15/2012   Rosacea 05/29/2009   Qualifier: Diagnosis of  By: [name] MD, [name] hearing loss (SNHL), bilateral 01/04/2017   Sleep apnea    was retested and no longer had it and so d/c CPAP   Syncope    Thoracic or lumbosacral neuritis or radiculitis, unspecified 07/03/2013   Tinnitus, bilateral 01/04/2017   Type II diabetes mellitus with complication ([place]) [id number]   Diabetic eye exam done by [facility]: Dr. Prudencio Burly on 11/08/13:  no diabetic retinopathy. Repeat in 1 year     Urticaria    Past Surgical History:  Procedure Laterality Date   BREAST EXCISIONAL BIOPSY Left 1970   benign cyst   BREAST SURGERY Left    BUNIONECTOMY     CARDIAC CATHETERIZATION N/A 12/16/2015   Procedure: Right Heart Cath;  Surgeon: Sherren Mocha, MD;  Location: [facility];  Service: Cardiovascular;  Laterality: N/A;   CHOLECYSTECTOMY     eye lid surgery Bilateral 05/09/2019   IMPLANTABLE CARDIOVERTER DEFIBRILLATOR IMPLANT  11-25-13   MDT dual chamber ICD implanted by [name] for primary prevention   IMPLANTABLE CARDIOVERTER DEFIBRILLATOR IMPLANT N/A 11/25/2013   Procedure: IMPLANTABLE CARDIOVERTER DEFIBRILLATOR IMPLANT;  Surgeon: [name], MD;  Location: [facility] CATH LAB;  Service: Cardiovascular;  Laterality: N/A;   LEFT AND RIGHT HEART CATHETERIZATION WITH CORONARY ANGIOGRAM N/A 05/31/2013   Procedure: LEFT AND RIGHT HEART CATHETERIZATION WITH CORONARY ANGIOGRAM;  Surgeon: [name], MD;  Location: [facility] CATH LAB;  Service: Cardiovascular;  Laterality: N/A;   TONSILLECTOMY     TUBAL LIGATION       A IV Location/Drains/Wounds Patient Lines/Drains/Airways Status     Active Line/Drains/Airways     Name Placement date Placement time Site Days   Peripheral IV 09/21/22 20 G Left;Posterior Forearm 09/21/22  1030  Forearm  less than 1            Intake/Output Last 24 hours  Intake/Output Summary (Last 24 hours) at 09/21/2022 2022 Last data filed at 09/21/2022 [id number] Gross per 24 hour  Intake --  Output 400 ml  Net -400 ml    Labs/Imaging Results for orders placed or performed during the hospital encounter of 09/21/22 (from the past 48 hour(s))  Basic metabolic panel     Status: Abnormal   Collection Time: 09/21/22 10:34 AM  Result Value Ref Range   Sodium 136 135 - 145 mmol/L   Potassium 4.1 3.5 - 5.1 mmol/L    Comment: HEMOLYSIS AT THIS LEVEL MAY AFFECT RESULT   Chloride 99 98 - 111 mmol/L   CO2 22 22 - 32  mmol/L   Glucose, Bld 165 (H) 70 - 99 mg/dL    Comment: Glucose reference range applies only to samples taken after fasting for at least 8 hours.   BUN 14 8 - 23 mg/dL   Creatinine, Ser 1.02 (H) 0.44 - 1.00 mg/dL   Calcium 9.9 8.9 - 10.3 mg/dL   GFR, Estimated 58 (L) >60 mL/min    Comment: (NOTE) Calculated using the CKD-EPI Creatinine Equation (2021)    Anion gap 15 5 - 15    Comment: Performed at [facility] [address], [place],  [address]  CBC WITH DIFFERENTIAL     Status:  None   Collection Time: 09/21/22 10:34 AM  Result Value Ref Range   WBC 8.9 4.0 - 10.5 K/uL   RBC 4.36 3.87 - 5.11 MIL/uL   Hemoglobin 13.4 12.0 - 15.0 g/dL   HCT 39.6 36.0 - 46.0 %   MCV 90.8 80.0 - 100.0 fL   MCH 30.7 26.0 - 34.0 pg   MCHC 33.8 30.0 - 36.0 g/dL   RDW 11.7 11.5 - 15.5 %   Platelets 191 150 - 400 K/uL   nRBC 0.0 0.0 - 0.2 %   Neutrophils Relative % 64 %   Neutro Abs 5.8 1.7 - 7.7 K/uL   Lymphocytes Relative 21 %   Lymphs Abs 1.9 0.7 - 4.0 K/uL   Monocytes Relative 9 %   Monocytes Absolute 0.8 0.1 - 1.0 K/uL   Eosinophils Relative 5 %   Eosinophils Absolute 0.4 0.0 - 0.5 K/uL   Basophils Relative 1 %   Basophils Absolute 0.1 0.0 - 0.1 K/uL   Immature Granulocytes 0 %   Abs Immature Granulocytes 0.01 0.00 - 0.07 K/uL    Comment: Performed at [facility], [address] [place], [place] [address]  Troponin I (High Sensitivity)     Status: None   Collection Time: 09/21/22 10:34 AM  Result Value Ref Range   Troponin I (High Sensitivity) 7 <18 ng/L    Comment: (NOTE) Elevated high sensitivity troponin I (hsTnI) values and significant  changes across serial measurements may suggest ACS but many other  chronic and acute conditions are known to elevate hsTnI results.  Refer to the "Links" section for chest pain algorithms and additional  guidance. Performed at [facility], [place] [address] [place], [place] [address]   Magnesium     Status: Abnormal    Collection Time: 09/21/22 10:34 AM  Result Value Ref Range   Magnesium 1.5 (L) 1.7 - 2.4 mg/dL    Comment: Performed at [facility] [address], [place], [place] [address]  CBG monitoring, ED     Status: Abnormal   Collection Time: 09/21/22 10:41 AM  Result Value Ref Range   Glucose-Capillary 189 (H) 70 - 99 mg/dL    Comment: Glucose reference range applies only to samples taken after fasting for at least 8 hours.  Troponin I (High Sensitivity)     Status: None   Collection Time: 09/21/22 12:33 PM  Result Value Ref Range   Troponin I (High Sensitivity) 9 <18 ng/L    Comment: (NOTE) Elevated high sensitivity troponin I (hsTnI) values and significant  changes across serial measurements may suggest ACS but many other  chronic and acute conditions are known to elevate hsTnI results.  Refer to the "Links" section for chest pain algorithms and additional  guidance. Performed at [facility], [place] [address], [place], [place] [address]   Urinalysis, Routine w reflex microscopic -Urine, Clean Catch     Status: Abnormal   Collection Time: 09/21/22  2:40 PM  Result Value Ref Range   Color, Urine YELLOW YELLOW   APPearance CLEAR CLEAR   Specific Gravity, Urine 1.011 1.005 - 1.030   pH 5.0 5.0 - 8.0   Glucose, UA NEGATIVE NEGATIVE mg/dL   Hgb urine dipstick NEGATIVE NEGATIVE   Bilirubin Urine NEGATIVE NEGATIVE   Ketones, ur 5 (A) NEGATIVE mg/dL   Protein, ur NEGATIVE NEGATIVE mg/dL   Nitrite NEGATIVE NEGATIVE   Leukocytes,Ua NEGATIVE NEGATIVE    Comment: Performed at [facility], [address] [place], [place] [address]  Resp panel by RT-PCR (RSV, Flu A&B, Covid) Anterior Nasal Swab     Status: None   Collection Time: 09/21/22  3:13 PM   Specimen: Anterior Nasal Swab  Result Value Ref Range   SARS Coronavirus 2 by RT PCR NEGATIVE NEGATIVE   Influenza A by PCR NEGATIVE NEGATIVE   Influenza B by PCR NEGATIVE NEGATIVE    Comment: (NOTE) The Xpert Xpress  SARS-CoV-2/FLU/RSV plus assay is intended as an aid in the diagnosis of influenza from Nasopharyngeal swab specimens and should not be used as a sole basis for treatment. Nasal washings and aspirates are unacceptable for Xpert Xpress SARS-CoV-2/FLU/RSV testing.  Fact Sheet for Patients: EntrepreneurPulse.com.au  Fact Sheet for Healthcare Providers: IncredibleEmployment.be  This test is not yet approved or cleared by the [place] FDA and has been authorized for detection and/or diagnosis of SARS-CoV-2 by FDA under an Emergency Use Authorization (EUA). This EUA will remain in effect (meaning this test can be used) for the duration of the COVID-19 declaration under Section 564(b)(1) of the Act, 21 U.S.C. section 360bbb-3(b)(1), unless the authorization is terminated or revoked.     Resp Syncytial Virus by PCR NEGATIVE NEGATIVE    Comment: (NOTE) Fact Sheet for Patients: EntrepreneurPulse.com.au  Fact Sheet for Healthcare Providers: IncredibleEmployment.be  This test is not yet approved or cleared by the [place] FDA and has been authorized for detection and/or diagnosis of SARS-CoV-2 by FDA under an Emergency Use Authorization (EUA). This EUA will remain in effect (meaning this test can be used) for the duration of the COVID-19 declaration under Section 564(b)(1) of the Act, 21 U.S.C. section 360bbb-3(b)(1), unless the authorization is terminated or revoked.  Performed at [facility], [place] [address], [place],  [address]    *Note: Due to a large number of results and/or encounters for the requested time period, some results have not been displayed. A complete set of results can be found in Results Review.   CT Head Wo Contrast  Result Date: 09/21/2022 CLINICAL DATA:  Syncope/presyncope, cerebrovascular cause suspected EXAM: CT HEAD WITHOUT CONTRAST TECHNIQUE: Contiguous axial images  were obtained from the base of the skull through the vertex without intravenous contrast. RADIATION DOSE REDUCTION: This exam was performed according to the departmental dose-optimization program which includes automated exposure control, adjustment of the mA and/or kV according to patient size and/or use of iterative reconstruction technique. COMPARISON:  08/15/2022 FINDINGS: Brain: No evidence of acute infarction, hemorrhage, hydrocephalus, extra-axial collection or mass lesion/mass effect. Vascular: Atherosclerotic calcifications involving the large vessels of the skull base. No unexpected hyperdense vessel. Skull: Normal. Negative for fracture or focal lesion. Sinuses/Orbits: Mucosal thickening of the ethmoid air cells. No acute abnormality. Other: Negative for scalp hematoma. IMPRESSION: 1. No acute intracranial abnormality. 2. Mild paranasal sinus disease. Electronically Signed   By: Davina Poke D.O.   On: 09/21/2022 11:47    Pending Labs Unresulted Labs (From admission, onward)     Start     Ordered   09/22/22 [id number]  Basic metabolic panel  Tomorrow morning,   R        09/21/22 1806   09/22/22 0500  CBC  Tomorrow morning,   R        09/21/22 1806   09/22/22 0500  Magnesium  Tomorrow morning,   R        09/21/22 1836            Vitals/Pain Today's  Vitals   09/21/22 1730 09/21/22 1800 09/21/22 1815 09/21/22 1900  BP: (!) 114/57 (!) 112/50 (!) 107/56 (!) 114/47  Pulse: (!) 111 (!) 105 (!) 103 100  Resp: 17 20 (!) 23 19  Temp:      TempSrc:      SpO2: 100% 96% 97% 97%  Weight:      Height:      PainSc:        Isolation Precautions No active isolations  Medications Medications  Budeson-Glycopyrrol-Formoterol 160-9-4.8 MCG/ACT AERO 2 puff (has no administration in time range)  famotidine (PEPCID) tablet 20 mg (has no administration in time range)  gabapentin (NEURONTIN) capsule 100 mg (has no administration in time range)  melatonin tablet 10 mg (has no administration in  time range)  montelukast (SINGULAIR) tablet 10 mg (has no administration in time range)  rosuvastatin (CRESTOR) tablet 10 mg (has no administration in time range)  enoxaparin (LOVENOX) injection 40 mg (has no administration in time range)  insulin aspart (novoLOG) injection 0-9 Units (has no administration in time range)  carbidopa-levodopa (SINEMET IR) 25-100 MG per tablet immediate release 2 tablet (has no administration in time range)  carbidopa-levodopa (SINEMET IR) 25-100 MG per tablet immediate release 2 tablet (has no administration in time range)  carbidopa-levodopa (SINEMET IR) 25-100 MG per tablet immediate release 1 tablet (has no administration in time range)  0.9 %  sodium chloride infusion ( Intravenous New Bag/Given 09/21/22 1955)  acetaminophen (TYLENOL) tablet 650 mg (has no administration in time range)  sodium chloride 0.9 % bolus 500 mL (0 mLs Intravenous Stopped 09/21/22 1349)  magnesium sulfate IVPB 2 g 50 mL (0 g Intravenous Stopped 09/21/22 1349)  acetaminophen (TYLENOL) tablet 1,000 mg (1,000 mg Oral Given 09/21/22 1226)  carbidopa-levodopa (SINEMET IR) 25-100 MG per tablet immediate release 2 tablet (2 tablets Oral Given 09/21/22 1226)    Mobility non-ambulatory     Focused Assessments Cardiac Assessment Handoff:    Lab Results  Component Value Date   CKTOTAL 164 10/17/2017   TROPONINI <0.30 10/31/2013   Lab Results  Component Value Date   DDIMER 0.63 (H) 04/20/2013   Does the Patient currently have chest pain? No   , Neuro Assessment Handoff:  Swallow screen pass? Yes          Neuro Assessment:   Neuro Checks:      Has TPA been given? No If patient is a Neuro Trauma and patient is going to OR before floor call report to [place] nurse: [phone] or [phone]  , Pulmonary Assessment Handoff:  Lung sounds:   O2 Device: Room Air      R Recommendations: See Admitting Provider Note  Report given to:   Additional Notes:

## 2022-09-21 NOTE — ED Provider Notes (Signed)
Received signout from previous provider, please see her note for complete H&P.  This is a 72 year old female significant history of hypertension, diabetes, Parkinson disease, presenting to the ED for evaluation of a syncopal episode.  Patient had a witnessed syncopal episode by her husband earlier today.  She did report feeling lightheadedness and dizziness prior to the episodes and subsequently found himself laying on the couch with EMS surrounding her.  Her husband did assist patient to the couch and no report of any injury were noted.  Workup today is remarkable for EKG that shows prolonged QT with a QTc of 503.  Head CT scan obtained independent viewed and read by me and I agree with radiology interpretation.  Fortunately CT scan did not show any concerning finding.  Magnesium level was 1.5, will replenish.  Patient has negative delta troponin, low suspicion for ACS.  Electrolyte panel is overall reassuring no evidence of anemia UA without signs of urinary tract infection, viral respiratory panel is currently pending.  EMR review, patient has an echo done on October 18, 2020 which shows an EF of 40-45%.  Patient also reported since she has been having just generalized weakness since January and has following this for some time.  She used to use a walker and now she is wheelchair-bound.  She also endorsed some left arm weakness ongoing for the past 2 weeks.  She is currently managed by family medicine team.  4:57 PM Appreciate consultation from [facility] Medicine resident who agrees to see and will admit pt for observation and further work up of syncope. Pt has soft BP, suspect orthostatic syncope.  Pt is on Lasix, will defer fluid resuscitation to admitting team.   BP 97/75   Pulse 95   Temp 97.8 F (36.6 C) (Oral)   Resp 20   Ht 5' 1"$  (1.549 m)   Wt 49.9 kg   LMP 08/01/2000 (Approximate)   SpO2 98%   BMI 20.78 kg/m   Results for orders placed or performed during the hospital encounter of  99991111  Basic metabolic panel  Result Value Ref Range   Sodium 136 135 - 145 mmol/L   Potassium 4.1 3.5 - 5.1 mmol/L   Chloride 99 98 - 111 mmol/L   CO2 22 22 - 32 mmol/L   Glucose, Bld 165 (H) 70 - 99 mg/dL   BUN 14 8 - 23 mg/dL   Creatinine, Ser 1.02 (H) 0.44 - 1.00 mg/dL   Calcium 9.9 8.9 - 10.3 mg/dL   GFR, Estimated 58 (L) >60 mL/min   Anion gap 15 5 - 15  CBC WITH DIFFERENTIAL  Result Value Ref Range   WBC 8.9 4.0 - 10.5 K/uL   RBC 4.36 3.87 - 5.11 MIL/uL   Hemoglobin 13.4 12.0 - 15.0 g/dL   HCT 39.6 36.0 - 46.0 %   MCV 90.8 80.0 - 100.0 fL   MCH 30.7 26.0 - 34.0 pg   MCHC 33.8 30.0 - 36.0 g/dL   RDW 11.7 11.5 - 15.5 %   Platelets 191 150 - 400 K/uL   nRBC 0.0 0.0 - 0.2 %   Neutrophils Relative % 64 %   Neutro Abs 5.8 1.7 - 7.7 K/uL   Lymphocytes Relative 21 %   Lymphs Abs 1.9 0.7 - 4.0 K/uL   Monocytes Relative 9 %   Monocytes Absolute 0.8 0.1 - 1.0 K/uL   Eosinophils Relative 5 %   Eosinophils Absolute 0.4 0.0 - 0.5 K/uL   Basophils Relative 1 %  Basophils Absolute 0.1 0.0 - 0.1 K/uL   Immature Granulocytes 0 %   Abs Immature Granulocytes 0.01 0.00 - 0.07 K/uL  Urinalysis, Routine w reflex microscopic -Urine, Clean Catch  Result Value Ref Range   Color, Urine YELLOW YELLOW   APPearance CLEAR CLEAR   Specific Gravity, Urine 1.011 1.005 - 1.030   pH 5.0 5.0 - 8.0   Glucose, UA NEGATIVE NEGATIVE mg/dL   Hgb urine dipstick NEGATIVE NEGATIVE   Bilirubin Urine NEGATIVE NEGATIVE   Ketones, ur 5 (A) NEGATIVE mg/dL   Protein, ur NEGATIVE NEGATIVE mg/dL   Nitrite NEGATIVE NEGATIVE   Leukocytes,Ua NEGATIVE NEGATIVE  Magnesium  Result Value Ref Range   Magnesium 1.5 (L) 1.7 - 2.4 mg/dL  CBG monitoring, ED  Result Value Ref Range   Glucose-Capillary 189 (H) 70 - 99 mg/dL  Troponin I (High Sensitivity)  Result Value Ref Range   Troponin I (High Sensitivity) 7 <18 ng/L  Troponin I (High Sensitivity)  Result Value Ref Range   Troponin I (High Sensitivity) 9 <18  ng/L   *Note: Due to a large number of results and/or encounters for the requested time period, some results have not been displayed. A complete set of results can be found in Results Review.   CT Head Wo Contrast  Result Date: 09/21/2022 CLINICAL DATA:  Syncope/presyncope, cerebrovascular cause suspected EXAM: CT HEAD WITHOUT CONTRAST TECHNIQUE: Contiguous axial images were obtained from the base of the skull through the vertex without intravenous contrast. RADIATION DOSE REDUCTION: This exam was performed according to the departmental dose-optimization program which includes automated exposure control, adjustment of the mA and/or kV according to patient size and/or use of iterative reconstruction technique. COMPARISON:  08/15/2022 FINDINGS: Brain: No evidence of acute infarction, hemorrhage, hydrocephalus, extra-axial collection or mass lesion/mass effect. Vascular: Atherosclerotic calcifications involving the large vessels of the skull base. No unexpected hyperdense vessel. Skull: Normal. Negative for fracture or focal lesion. Sinuses/Orbits: Mucosal thickening of the ethmoid air cells. No acute abnormality. Other: Negative for scalp hematoma. IMPRESSION: 1. No acute intracranial abnormality. 2. Mild paranasal sinus disease. Electronically Signed   By: Davina Poke D.O.   On: 09/21/2022 11:47   CUP PACEART REMOTE DEVICE CHECK  Result Date: 08/25/2022 Scheduled remote reviewed. Normal device function.  Next remote 91 days. Kathy Breach, RN, CCDS, [employer]     Domenic Moras, Hershal Coria 09/21/22 1659    Teressa Lower, MD 09/22/22 [phone]

## 2022-09-21 NOTE — Assessment & Plan Note (Signed)
Mildly prolonged to 503 upon admission. Will attempt to reduce medication effect on QT interval. -Daily EKG

## 2022-09-21 NOTE — Progress Notes (Signed)
FMTS Interim Progress Note  S: Went to evaluate patient given change in mews score of 4.  Patient was in bed comfortably, did not difficulty breathing, shortness of breath, pain.  She is wanting to know when she can get a hospital bed.  She notes she is feeling fine.  O: BP 135/80   Pulse (!) 123   Temp 97.8 F (36.6 C) (Oral)   Resp (!) 31   Ht 5' 1"$  (1.549 m)   Wt 49.9 kg   LMP 08/01/2000 (Approximate)   SpO2 96%   BMI 20.78 kg/m   Gen: awake, alert, NAD CV: RRR, no murmurs auscultated Pulm: CTAB, normal WOB, RR 16  A/P: Syncope Continue plan per day team  Sinus tachycardia Rate in the 110s while in room.  Respiratory rate appropriate.  Will continue to monitor while on fluids as planned per day team.  Wells Guiles, DO 09/21/2022, 9:12 PM PGY-2, [facility] pager [phone]

## 2022-09-21 NOTE — ED Triage Notes (Signed)
Pt BIB by [facility], family called for syncopal episode, LOC noted.  Did not fall, was on the couch.    Co weakness and a headache   110/56 HR 88 16 resp 96% RA CBG 234 12 lead Left Bundle

## 2022-09-21 NOTE — Assessment & Plan Note (Addendum)
Witnessed syncope that occurred when eating while sitting in her wheelchair. Associated with SOB and dizziness prior to event. Likely secondary to dehydration in the setting of poor PO intake vs vasovagal episode vs orthostatic hypotension. AICD investigated and functioning appropriately so less likely due to arrhythmia. Neurologically intact without focal deficits or incontinence. -Admitted to FMTS, Dr. Nori Riis attending -Echo -Orthostatic vitals -NS @ 70 cc/hr (monitor fluid status) -Cardiac monitoring for arrhythmia -Monitor PO intake and consider fluids as needed for rehydration -PT/OT following -Hold lasix dose today -AM BMP, Mag, CBC

## 2022-09-21 NOTE — Assessment & Plan Note (Addendum)
Rates high 90s - low 100s while in the room. In sinus rhythm. Has been having to hold home metoprolol due to hypotension. - CTM

## 2022-09-21 NOTE — H&P (Cosign Needed Addendum)
Hospital Admission History and Physical Service Pager: [phone]  Patient name: Veronica Shaw Medical record number: [id number] Date of Birth: November 20, 1950 Age: 72 y.o. Gender: female  Primary Care Provider: Gerrit Heck, MD Consultants: None Code Status: FULL which was confirmed with family if patient unable to confirm   Preferred Emergency Contact:  Contact Information     Name Relation Home Work [place] Granddaughter [phone]   [phone]   Christianne Dolin Daughter [phone]         Chief Complaint: Syncope  Assessment and Plan: Sadia Brisia Billing is a 72 y.o. female presenting with syncope with associated dizziness prior . Differential for this patient's presentation of this includes arrhythmia (ICD functioning appropriately), valvular dysfunction (last echo in 2022, h/o HF), orthostatic hypotension (wheelchair bound no new position changes), dehydration (poor PO intake at baseline and increased Lasix recently), vasovagal (no stated exertion of vagal symptoms) and seizure (no bladder or bowel incontinence or hx of seizures).   * Syncope Witnessed syncope that occurred when eating while sitting in her wheelchair. Associated with SOB and dizziness prior to event. Likely secondary to dehydration in the setting of poor PO intake vs vasovagal episode vs orthostatic hypotension. AICD investigated and functioning appropriately so less likely due to arrhythmia. Neurologically intact without focal deficits or incontinence. -Admitted to [facility], Dr. Nori Riis attending -Echo -Orthostatic vitals -NS @ 70 cc/hr (monitor fluid status) -Cardiac monitoring for arrhythmia -Monitor PO intake and consider fluids as needed for rehydration -PT/OT following -Hold lasix dose today -AM BMP, Mag, CBC  Nocturnal hypoxia Uses oxygen 2L at night time and sometimes when she needs it right after waking up. Seen by Pulm 2023 PSG not related to OSA. -Continue nightly 2L oxygen  support and prn  QT prolongation Mildly prolonged to 503 upon admission. Will attempt to reduce medication effect on QT interval. -Daily EKG  Sinus tachycardia (HCC) Rates 100-110s while in the room. In sinus rhythm. No chest pain-likely in setting of dehydration. Has been having to hold home metoprolol due to hypotension. Trops 7>9. -Monitor while on fluids  Type II diabetes mellitus with complication (HCC) BGL elevated upon admission. A1c 5.7 (07/2022). Compliant with Metformin 52m daily and Glipizide 2.51mat home. -sSSI -Recommend stopping glipizide upon d/c   Chronic conditions: Parkinson disease: continue Sinemet 25-100 HFrEF: hold home Lasix 2027mn the setting of possible dehydration HLD: continue home Crestor 78m75mthma: continue home Breztri   FEN/GI: Carb modified VTE Prophylaxis: Lovenox  Disposition: Med tele  History of Present Illness:  Veronica Shaw 71 y17. female presenting with an episode of witnessed syncope while husband was trying to feed her breakfast. States her eyes rolled back and he moved her to the sofa where she woke up in the living room. Associated with problems breathing and dizziness prior to episode. Husband tried to give her her inhaler but it did not help. States she was seated in her wheelchair the entire time and did not experience any trauma. Denies HA, chest pain, sweating, N/V, fever, diarrhea and abdominal pain. Denies ICD shocks or alerts. Endorses poor PO intake at baseline and states she has lost 30+ pounds and is "always dehydrated".  Of note, her Lasix was increased to 20 mg daily last week at cardiologist. Reports she fell 4 times in January and hit her head and it still hurts sometimes.  Currently reports her only symptom is feeling more sluggish than usual. Patient was planning to enter hospice services  today due to decline from [place].  In the ED, was given 500 mL bolus of NS. CBC and BMP wnl. Troponin 7>9.  Urine not infeted appearing and respiratory panel negative. CT Head negative for acute issues. Some QTC prolongation on EKG. Mag 1.5 which was repleted.   Review Of Systems: Per HPI above  Pertinent Past Medical History: NICM  HTN HFmrEF GERD T2DM Parkinson disease  Asthma HLD MDD Remainder reviewed in history tab.   Pertinent Past Surgical History: Cholecystectomy ICD Prior cardiac cath Remainder reviewed in history tab.   Pertinent Social History: Tobacco use: None Alcohol use: None Other Substance use: None Lives with husband Uses scooter at home-husband pushes her around. When she goes to the doctor's she uses a wheelchair.  Pertinent Family History: Mother: MI, DM Father: MI, DM Remainder reviewed in history tab.   Important Outpatient Medications: Tylenol 650 mg prn Apple cider vinegar Breztri 160-9-4.8, 2 puffs BID Sinemet 25-100, 7AM(2), 11AM(2) and 4PM(1) Pepcid 60m daily Estradiol cream Flonase as needed Flonase Lasix 225mGlipizide 2.5 mg daily Gabapentin 10071maily at night Xopenoex as needed Not been using meclizine  Melatonin 10 mg Metformin 500m13mily Singulair 10 mg at night Potassium 10 - hasn't taken in a while  Crestor 10 three times a week last took Monday Not doing tizanidine Metoprolol 50mg21m (has not been taking at home since cardiologist since she had low Bps but does have high heart rates) Remainder reviewed in medication history.   Objective: BP (!) 107/56   Pulse (!) 103   Temp 97.8 F (36.6 C) (Oral)   Resp (!) 23   Ht 5' 1"$  (1.549 m)   Wt 49.9 kg   LMP 08/01/2000 (Approximate)   SpO2 97%   BMI 20.78 kg/m  Exam: General: Alert, NAD Eyes: PERRLA, anicteric sclera ENTM: Moist mucus membranes Neck: Supple, full ROM. Mildly tender to palpation of neck msuculature Cardiovascular: RRR without murmur. AICD in place Respiratory: CTAB. Normal WOB on RA Gastrointestinal: Soft, non-tender, non-distended MSK: No  peripheral edema Derm: War, dry, no rashes noted Neuro: CN intact. Motor and sensation intact globally. 5/5 grip strengths and hip flexion bilaterally. Psych: Cooperative, pleasant  Labs:  CBC BMET  Recent Labs  Lab 09/21/22 1034  WBC 8.9  HGB 13.4  HCT 39.6  PLT 191   Recent Labs  Lab 09/21/22 1034  NA 136  K 4.1  CL 99  CO2 22  BUN 14  CREATININE 1.02*  GLUCOSE 165*  CALCIUM 9.9     Pertinent additional labs: Troponin: 7>9 Quad screen: neg UA: wnl   EKG: Prolonged QT interval   Imaging Studies Performed: CT Head Wo Contrast Result Date: 09/21/2022 IMPRESSION: 1. No acute intracranial abnormality. 2. Mild paranasal sinus disease.    HernaColletta Maryland2/21/2024, 6:40 PM PGY-1, [facility] pager: [phone] pages welcome Secure chat group [facility]   Upper Level Addendum:  I have seen and evaluated this patient along with Dr. HernaJerilee Hohreviewed the above note, making necessary revisions as appropriate.  I agree with the medical decision making and physical exam as noted above.  MayurGerrit HeckPGY-2 [facility]

## 2022-09-22 ENCOUNTER — Observation Stay (HOSPITAL_BASED_OUTPATIENT_CLINIC_OR_DEPARTMENT_OTHER): Payer: Medicare Other

## 2022-09-22 DIAGNOSIS — I110 Hypertensive heart disease with heart failure: Secondary | ICD-10-CM | POA: Diagnosis not present

## 2022-09-22 DIAGNOSIS — E119 Type 2 diabetes mellitus without complications: Secondary | ICD-10-CM | POA: Diagnosis not present

## 2022-09-22 DIAGNOSIS — Z1152 Encounter for screening for COVID-19: Secondary | ICD-10-CM | POA: Diagnosis not present

## 2022-09-22 DIAGNOSIS — Z7984 Long term (current) use of oral hypoglycemic drugs: Secondary | ICD-10-CM | POA: Diagnosis not present

## 2022-09-22 DIAGNOSIS — R000 Tachycardia, unspecified: Secondary | ICD-10-CM | POA: Diagnosis not present

## 2022-09-22 DIAGNOSIS — I959 Hypotension, unspecified: Secondary | ICD-10-CM

## 2022-09-22 DIAGNOSIS — R55 Syncope and collapse: Secondary | ICD-10-CM

## 2022-09-22 DIAGNOSIS — R0902 Hypoxemia: Secondary | ICD-10-CM | POA: Diagnosis not present

## 2022-09-22 DIAGNOSIS — R9431 Abnormal electrocardiogram [ECG] [EKG]: Secondary | ICD-10-CM | POA: Diagnosis not present

## 2022-09-22 DIAGNOSIS — Z79899 Other long term (current) drug therapy: Secondary | ICD-10-CM | POA: Diagnosis not present

## 2022-09-22 DIAGNOSIS — Z7952 Long term (current) use of systemic steroids: Secondary | ICD-10-CM | POA: Diagnosis not present

## 2022-09-22 DIAGNOSIS — G20C Parkinsonism, unspecified: Secondary | ICD-10-CM | POA: Diagnosis not present

## 2022-09-22 DIAGNOSIS — I5020 Unspecified systolic (congestive) heart failure: Secondary | ICD-10-CM | POA: Diagnosis not present

## 2022-09-22 LAB — ECHOCARDIOGRAM COMPLETE
AR max vel: 3.22 cm2
AV Area VTI: 3.06 cm2
AV Area mean vel: 3.05 cm2
AV Mean grad: 2 mmHg
AV Peak grad: 3.7 mmHg
Ao pk vel: 0.96 m/s
Area-P 1/2: 6.54 cm2
Height: 61 in
S' Lateral: 3 cm
Weight: 1760 oz

## 2022-09-22 LAB — GLUCOSE, CAPILLARY
Glucose-Capillary: 113 mg/dL — ABNORMAL HIGH (ref 70–99)
Glucose-Capillary: 209 mg/dL — ABNORMAL HIGH (ref 70–99)
Glucose-Capillary: 232 mg/dL — ABNORMAL HIGH (ref 70–99)
Glucose-Capillary: 111 mg/dL — ABNORMAL HIGH (ref 70–99)

## 2022-09-22 LAB — BASIC METABOLIC PANEL
Anion gap: 11 (ref 5–15)
BUN: 13 mg/dL (ref 8–23)
CO2: 28 mmol/L (ref 22–32)
Calcium: 9.7 mg/dL (ref 8.9–10.3)
Chloride: 102 mmol/L (ref 98–111)
Creatinine, Ser: 0.94 mg/dL (ref 0.44–1.00)
GFR, Estimated: >60 mL/min (ref >60)
Glucose, Bld: 119 mg/dL — ABNORMAL HIGH (ref 70–99)
Potassium: 3.5 mmol/L (ref 3.5–5.1)
Sodium: 141 mmol/L (ref 135–145)

## 2022-09-22 LAB — CBC
HCT: 36.6 % (ref 36.0–46.0)
Hemoglobin: 12 g/dL (ref 12.0–15.0)
MCH: 29.9 pg (ref 26.0–34.0)
MCHC: 32.8 g/dL (ref 30.0–36.0)
MCV: 91.3 fL (ref 80.0–100.0)
Platelets: 172 10*3/uL (ref 150–400)
RBC: 4.01 MIL/uL (ref 3.87–5.11)
RDW: 11.7 % (ref 11.5–15.5)
WBC: 6.3 10*3/uL (ref 4.0–10.5)
nRBC: 0 % (ref 0.0–0.2)

## 2022-09-22 LAB — MAGNESIUM: Magnesium: 2 mg/dL (ref 1.7–2.4)

## 2022-09-22 MED ORDER — POLYETHYLENE GLYCOL 3350 17 G PO PACK: 34.0000 g | PACK | Freq: Once | ORAL | Status: DC

## 2022-09-22 NOTE — Progress Notes (Signed)
MEWS Progress Note  Patient Details Name: Veronica Shaw MRN: [id number] DOB: 1951/06/20 Today's Date: 09/22/2022   MEWS Flowsheet Documentation:  Assess: MEWS Score Temp: 99 F (37.2 C) BP: (!) 132/53 MAP (mmHg): 75 Pulse Rate: (!) 112 ECG Heart Rate: (!) 104 Resp: 14 Level of Consciousness: Alert SpO2: 96 % O2 Device: Room Air O2 Flow Rate (L/min): 2 L/min Assess: MEWS Score MEWS Temp: 0 MEWS Systolic: 0 MEWS Pulse: 2 MEWS RR: 0 MEWS LOC: 0 MEWS Score: 2 MEWS Score Color: Yellow Assess: SIRS CRITERIA SIRS Temperature : 0 SIRS Respirations : 0 SIRS Pulse: 1 SIRS WBC: 0 SIRS Score Sum : 1 Assess: if the MEWS score is Yellow or Red Were vital signs taken at a resting state?: Yes Focused Assessment: No change from prior assessment Does the patient meet 2 or more of the SIRS criteria?: No MEWS guidelines implemented : Yes, yellow Treat MEWS Interventions: Considered administering scheduled or prn medications/treatments as ordered Take Vital Signs Increase Vital Sign Frequency : Yellow: Q2hr x1, continue Q4hrs until patient remains green for 12hrs Escalate MEWS: Escalate: Yellow: Discuss with charge nurse and consider notifying provider and/or RRT Provider Notification Provider Name/Title: Dr. Gwendolyn Lima Date Provider Notified: 09/22/22 Time Provider Notified: [id number] Method of Notification: Page Notification Reason: Other (Comment) (Yellow MEWS) Provider response: No new orders Date of Provider Response:  (no response)      [name] 09/22/2022, 7:52 PM

## 2022-09-22 NOTE — Progress Notes (Addendum)
     Daily Progress Note Intern Pager: [phone]  Patient name: Veronica Shaw Medical record number: [id number] Date of birth: [birth year] Age: 72 y.o. Gender: female  Primary Care Provider: Gerrit Heck, MD Consultants: None Code Status: FULL which was confirmed with family if patient unable to confirm     Pt Overview and Major Events to Date:  2/21 admitted  Assessment and Plan: Veronica Shaw is a 72 y.o. female presenting with syncope with associated dizziness prior.  * Syncope Patient has not had further syncopal episodes overnight. -Cardiac monitoring for arrhythmia - stop fluids -PT/OT following - holding lasix - AM BMP, Mag, CBC - f/u echo, orthostatic vitals  Sinus tachycardia (HCC) Rates high 90s - low 100s while in the room. In sinus rhythm. Has been having to hold home metoprolol due to hypotension. - CTM  QT prolongation Mildly prolonged to 503 upon admission, now 497. Will attempt to reduce medication effect on QT interval. -Daily EKG  Nocturnal hypoxia Uses oxygen 2L at night time and sometimes when she needs it right after waking up. Seen by Pulm 2023 PSG not related to OSA. -Continue nightly 2L oxygen support and prn  Type II diabetes mellitus with complication (HCC) Takes Metformin 59m daily and Glipizide 2.595mat home. Last A1c 5.7 (07/2022) -sSSI -Recommend stopping glipizide upon d/c  Chronic conditions: Parkinson disease: continue Sinemet 25-100 HFrEF: hold home Lasix 2038mn the setting of possible dehydration HLD: continue home rosuvastatin 41m19mthma: continue home Breztri  FEN/GI: Carb modified VTE Prophylaxis: Lovenox  Subjective:  Patient feels good today. Denies other syncopal episodes overnight. Denies chest pain. She says she wants to defecate badly but cannot do it on the bed pan - she hopes she can go home soon so she can use the bathroom.  Objective: Temp:  [98.2 F (36.8 C)-98.8 F (37.1 C)] 98.2 F (36.8 C)  (02/22 1114) Pulse Rate:  [82-123] 104 (02/22 1114) Resp:  [10-31] 16 (02/22 1114) BP: (97-135)/(46-84) 110/46 (02/22 1114) SpO2:  [95 %-100 %] 96 % (02/22 1114) Physical Exam: General: in no acute distress and well-appearing HEENT: normocephalic and atraumatic Respiratory: clear to auscultation bilaterally anteriorly, non-labored breathing, and on RA Extremities: moving all extremities spontaneously Gastrointestinal: non-tender and non-distended Cardiovascular:  regular bordering on tachycardic rate  Laboratory: Most recent CBC Lab Results  Component Value Date   WBC 6.3 09/22/2022   HGB 12.0 09/22/2022   HCT 36.6 09/22/2022   MCV 91.3 09/22/2022   PLT 172 09/22/2022   Most recent BMP    Latest Ref Rng & Units 09/22/2022    5:12 AM  BMP  Glucose 70 - 99 mg/dL 119   BUN 8 - 23 mg/dL 13   Creatinine 0.44 - 1.00 mg/dL 0.94   Sodium 135 - 145 mmol/L 141   Potassium 3.5 - 5.1 mmol/L 3.5   Chloride 98 - 111 mmol/L 102   CO2 22 - 32 mmol/L 28   Calcium 8.9 - 10.3 mg/dL 9.7    Davison Ohms,Camelia Phenes 09/22/2022, 11:21 AM  PGY-1, [facility] pager: [phone] pages welcome Secure chat group [facility] [place]

## 2022-09-22 NOTE — Discharge Summary (Signed)
[facility] Discharge Summary  Patient name: Veronica Shaw Medical record number: [id number] Date of birth: 02/14/1951 Age: 72 y.o. Gender: female Date of Admission: 09/21/2022  Date of Discharge: 09/23/2022  Admitting Physician: Dickie La, MD  Primary Care Provider: Gerrit Heck, MD Consultants: none  Indication for Hospitalization: syncope  Brief Hospital Course:  Veronica Shaw is a 72 y.o.female with a history of T2DM, HTN, Parkinson's, HFmrEF (40-45%), ICD  who was admitted to the [facility] Medicine Teaching Service at [facility] for syncope. Her hospital course is detailed below:  Syncope Patient had a witnessed syncopal episode that was preceded by SOB and dizziness prior to event while seated in wheelchair. Unknown etiology at this time. CT head negative. While hospitalized, patient was given fluids, echo obtained which showed EF 45 - 50% and left ventricle demonstrating global hypokinesis. At discharge patient was hemodynamically stable.  Sinus tachycardia Rates on 100 - 110s on initial exam. Likely attributable to dehydration. Patient given fluids as above. Metoprolol was held due to hypotensive episodes.  Other chronic conditions were medically managed with home medications and formulary alternatives as necessary (T2DM, Parkinson disease, HLD)  PCP Follow-up Recommendations:  Regular follow-up  Discharge Diagnoses/Problem List:  * Syncope Sinus tachycardia QT prolongation Nocturnal hypoxia Type II diabetes mellitus with complication  Disposition: home with hospice  Discharge Condition: stable  Discharge Exam:  BP (!) 144/85 (BP Location: Left Arm)   Pulse (!) 107   Temp 98.3 F (36.8 C) (Oral)   Resp 15   Ht '5\' 1"'$  (1.549 m)   Wt 49.9 kg   LMP 08/01/2000 (Approximate)   SpO2 97%   BMI 20.78 kg/m  Physical Exam: General: in no acute distress and well-appearing HEENT: normocephalic and atraumatic Respiratory: clear to  auscultation bilaterally anteriorly, non-labored breathing, and on RA Extremities: moving all extremities spontaneously Gastrointestinal: non-tender and non-distended Cardiovascular:  rate regular bordering on tachycardic  Significant Labs and Imaging:  Recent Labs  Lab 09/21/22 1034 09/22/22 0512  WBC 8.9 6.3  HGB 13.4 12.0  HCT 39.6 36.6  PLT 191 172   Recent Labs  Lab 09/21/22 1034 09/22/22 0512  NA 136 141  K 4.1 3.5  CL 99 102  CO2 22 28  GLUCOSE 165* 119*  BUN 14 13  CREATININE 1.02* 0.94  CALCIUM 9.9 9.7  MG 1.5* 2.0   Discharge Medications:  Allergies as of 09/23/2022       Reactions   Aspirin Swelling, Other (See Comments)   Other reaction(s): Other (See Comments) Causes nose bleeds *ONLY THE COATED ASA* Causes nose bleeds *ONLY THE COATED ASA*   Mirtazapine Other (See Comments)   Hulucinations, sucidal thoughts   Penicillins Shortness Of Breath, Rash   Shortness of Breath - Throat felt like it was closing.    Prempro [conj Estrog-medroxyprogest Ace] Shortness Of Breath   Throat swelling Throat swelling   Ace Inhibitors Cough   Simvastatin Other (See Comments), Rash   Muscle aches        Medication List     TAKE these medications    acetaminophen 500 MG tablet Commonly known as: TYLENOL Take 2 tablets (1,000 mg total) by mouth every 8 (eight) hours as needed.   APPLE CIDER VINEGAR PO Take 1 capsule by mouth daily.   Biotin 5000 MCG Caps Take 1 tablet by mouth daily.   Breztri Aerosphere 160-9-4.8 MCG/ACT Aero Generic drug: Budeson-Glycopyrrol-Formoterol Inhale 2 puffs into the lungs in the morning and at  bedtime.   carbidopa-levodopa 25-100 MG tablet Commonly known as: SINEMET IR 2 tablets at 7am, 2 at 11am, 1 tablet at 4pm   ciclopirox 8 % solution Commonly known as: Penlac Apply topically at bedtime. Apply over nail and surrounding skin. Apply daily over previous coat. After seven (7) days, may remove with alcohol and continue  cycle.   diclofenac Sodium 1 % Gel Commonly known as: VOLTAREN APPLY 4 GRAMS TOPICALLY FOUR TIMES DAILY   estradiol 0.1 MG/GM vaginal cream Commonly known as: ESTRACE every other day.   famotidine 20 MG tablet Commonly known as: PEPCID TAKE 1 TABLET TWICE DAILY   Flonase Sensimist 27.5 MCG/SPRAY nasal spray Generic drug: fluticasone Place 2 sprays into the nose daily.   fluticasone 50 MCG/ACT nasal spray Commonly known as: FLONASE Place 2 sprays into both nostrils daily.   furosemide 20 MG tablet Commonly known as: LASIX Take 1 tablet (20 mg total) by mouth daily as needed (take for fluid accumulation in extremities, or weight gain of 3 pounds or more overnight). What changed:  when to take this reasons to take this   gabapentin 100 MG capsule Commonly known as: NEURONTIN Take 100 mg by mouth daily.   levalbuterol 0.63 MG/3ML nebulizer solution Commonly known as: XOPENEX USE 1 VIAL IN NEBULIZER EVERY 4 TO 6 HOURS AS NEEDED FOR WHEEZING AND FOR SHORTNESS OF BREATH   meclizine 25 MG tablet Commonly known as: ANTIVERT Take 1 tablet (25 mg total) by mouth 3 (three) times daily as needed for dizziness.   melatonin 5 MG Tabs Take 5 mg by mouth at bedtime.   metFORMIN 500 MG tablet Commonly known as: GLUCOPHAGE Take 1 tablet (500 mg total) by mouth daily with breakfast.   metoprolol succinate 50 MG 24 hr tablet Commonly known as: TOPROL-XL Take 1 tablet (50 mg total) by mouth 2 (two) times daily.   montelukast 10 MG tablet Commonly known as: SINGULAIR Take 1 tablet (10 mg total) by mouth at bedtime.   OXYGEN Inhale into the lungs at bedtime. Inhale 2L into lungs   polyethylene glycol 17 g packet Commonly known as: MIRALAX / GLYCOLAX Take 17 g by mouth daily.   potassium chloride 10 MEQ tablet Commonly known as: KLOR-CON Take 1 tablet (10 mEq total) by mouth daily as needed (Take if taking lasix). What changed:  when to take this reasons to take this    rosuvastatin 10 MG tablet Commonly known as: CRESTOR Take 1 tablet (10 mg total) by mouth as directed. Three times a week   tiZANidine 2 MG tablet Commonly known as: ZANAFLEX TAKE 1 TABLET BY MOUTH EVERY 8 HOURS AS NEEDED FOR MUSCLE SPASM   ULTRA COQ10 PO Take 1 tablet by mouth 3 (three) times a week.        Discharge Instructions: Please refer to Patient Instructions section of EMR for full details.  Patient was counseled important signs and symptoms that should prompt return to medical care, changes in medications, dietary instructions, activity restrictions, and follow up appointments.   Follow-Up Appointments:  Follow-up Information     [facility] Follow up on 09/28/2022.   Specialty: Cardiology Contact information: [address], [place], [place], DO Follow up on 09/26/2022.   Specialty: Family Medicine Why: 11:10 AM Contact information: [place] [address] [phone]  Camelia Phenes, MD 09/23/2022, 10:28 AM PGY-1, [place]

## 2022-09-22 NOTE — Discharge Instructions (Addendum)
Dear Veronica Shaw,   Thank you so much for allowing [place] to be part of your care!  You were admitted to [facility] for syncope. This is most likely related to dehydration. We gave you fluids while here.   POST-HOSPITAL & CARE INSTRUCTIONS Please let PCP/Specialists know of any changes that were made.  Please see medications section of this packet for any medication changes.  STOP taking glipizide  DOCTOR'S APPOINTMENT & FOLLOW UP CARE INSTRUCTIONS  Future Appointments  Date Time Provider [place]  09/22/2022  2:00 PM [facility] ECHO 5 [facility]-E[facility]OL[facility] [place]  09/28/2022 11:05 AM [facility] LAB [facility]  09/30/2022  1:00 PM Jacqulyn Cane, RN ACP-ACP None  10/05/2022  1:30 PM Lazaro Arms, RN THN-CCC None  11/24/2022  8:10 AM CVD-[facility] DEVICE REMOTES [facility]  12/01/2022 11:10 AM Shamleffer, Melanie Crazier, MD [facility] None  12/19/2022  2:20 PM Sherren Mocha, MD [facility]  12/20/2022 10:45 AM Tat, Eustace Quail, DO LBN-LBNG None  02/23/2023  8:10 AM CVD-[facility] DEVICE REMOTES [facility]  05/25/2023  8:10 AM CVD-[facility] DEVICE REMOTES [facility]  08/24/2023  8:10 AM CVD-[facility] DEVICE REMOTES [facility]  11/23/2023  8:10 AM CVD-[facility] DEVICE REMOTES [facility]    RETURN PRECAUTIONS: Worsening dizziness, falls, chest pain  Take care and be well!  [facility]  [place], [place] [address] [phone]

## 2022-09-22 NOTE — Evaluation (Signed)
Occupational Therapy Evaluation Patient Details Name: Veronica Shaw MRN: [id number] DOB: October 05, 1950 Today's Date: 09/22/2022   History of Present Illness Pt is a 72 y.o. female who presented 09/21/22 with syncopal episode. CT of head negative for acute intracranial abnormality. PMH: DM2, HTN, Parkinson's, asthma, HLD, and CHF with defibrillator   Clinical Impression   PTA, pt lives with spouse, has had recent decline since January with frequent falls and limited ambulation. Pt reports assist from spouse and children for ADLs d/t deficits. Pt presents now fairly close to this reported baseline, requiring Mod A for bed mobility and Mod A x2 for brief standing with RW. Pt requires Mod-Total A for ADLs. Pt with lightheadedness with transitional movements (see PT note for orthostatic readings) and increasing neck pain so unable to progress OOB. Noted active hospice referral; pending family support and compatability with hospice, feel pt would benefit from [facility] for adaptive strategies during ADLs and caregiver education to maximize safety/independence. Will follow acutely while admitted.      Recommendations for follow up therapy are one component of a multi-disciplinary discharge planning process, led by the attending physician.  Recommendations may be updated based on patient status, additional functional criteria and insurance authorization.   Follow Up Recommendations  Home health OT (if able to be coordinated with pending hospice referral)     Assistance Recommended at Discharge Frequent or constant Supervision/Assistance  Patient can return home with the following A lot of help with walking and/or transfers;A lot of help with bathing/dressing/bathroom;Assistance with feeding    Functional Status Assessment  Patient has had a recent decline in their functional status and demonstrates the ability to make significant improvements in function in a reasonable and predictable amount of time.   Equipment Recommendations  Wheelchair cushion (measurements OT);Wheelchair (measurements OT);BSC/3in1    Recommendations for Other Services       Precautions / Restrictions Precautions Precautions: Fall;Other (comment) Precaution Comments: watch BP Restrictions Weight Bearing Restrictions: No      Mobility Bed Mobility Overal bed mobility: Needs Assistance Bed Mobility: Supine to Sit, Sit to Supine     Supine to sit: +2 for safety/equipment, HOB elevated, Mod assist Sit to supine: Min assist, +2 for safety/equipment, HOB elevated   General bed mobility comments: ModA to guide legs off EOB, but primarily to ascend trunk and scoot hips to edge using bed pad. MinA to manage legs back onto bed to return to supine    Transfers Overall transfer level: Needs assistance Equipment used: Rolling walker (2 wheels) Transfers: Sit to/from Stand Sit to Stand: Mod assist, +2 physical assistance, +2 safety/equipment           General transfer comment: ModAx2 to power up to stand and steady pt at RW with pt pulling up on RW. Provided verbal and tactile cues for pt to extend hips and trunk to gain standing balance, but she displayed a posterior bias and increasing trunk flexion as time in standing progressed.      Balance Overall balance assessment: Needs assistance Sitting-balance support: Bilateral upper extremity supported, Feet supported Sitting balance-Leahy Scale: Poor Sitting balance - Comments: Posterior bias, needing min-modA for static sitting balance with brief episodes of min guard Postural control: Posterior lean Standing balance support: Bilateral upper extremity supported, During functional activity, Reliant on assistive device for balance Standing balance-Leahy Scale: Poor Standing balance comment: Reliant on RW and modAx2 to stand, displaying a posterior bias and very flexed posture; stood ~45 sec  ADL either performed or assessed  with clinical judgement   ADL Overall ADL's : Needs assistance/impaired Eating/Feeding: Bed level;Moderate assistance;Sitting   Grooming: Moderate assistance;Bed level;Sitting   Upper Body Bathing: Maximal assistance;Sitting   Lower Body Bathing: Maximal assistance;Sitting/lateral leans;Bed level   Upper Body Dressing : Maximal assistance;Sitting;Bed level   Lower Body Dressing: Total assistance;Sitting/lateral leans;Bed level   Toilet Transfer: Moderate assistance;+2 for physical assistance;+2 for safety/equipment;Stand-pivot   Toileting- Clothing Manipulation and Hygiene: Total assistance;Sitting/lateral lean;Bed level         General ADL Comments: Likely fairly close to reported baseline with recent quick decline. feel pt would benefit from adaptive strategies education and caregiver collaboration to maximize safety and independence     Vision Baseline Vision/History: 1 Wears glasses Ability to See in Adequate Light: 0 Adequate Patient Visual Report: No change from baseline Vision Assessment?: No apparent visual deficits     Perception     Praxis      Pertinent Vitals/Pain Pain Assessment Pain Assessment: Faces Faces Pain Scale: Hurts even more Pain Location: neck Pain Descriptors / Indicators: Discomfort, Grimacing, Guarding Pain Intervention(s): Limited activity within patient's tolerance, Monitored during session     Hand Dominance Right   Extremity/Trunk Assessment Upper Extremity Assessment Upper Extremity Assessment: Generalized weakness (reports neuropathic changes in sensation)   Lower Extremity Assessment Lower Extremity Assessment: Defer to PT evaluation   Cervical / Trunk Assessment Cervical / Trunk Assessment: Kyphotic (chronic neck pain)   Communication Communication Communication: No difficulties   Cognition Arousal/Alertness: Awake/alert Behavior During Therapy: WFL for tasks assessed/performed Overall Cognitive Status: Within Functional  Limits for tasks assessed                                 General Comments: Slow processing, but likely baseline     General Comments  Vitals - 129/53 (75) & 97 bpm supine, 137/62 (81) & 105 bpm sitting, 117/65 (78) & 118 bpm standing, unable to stand 3 min for final orthostatic read; reported lightheadedness with positional changes; educated pt on changing positions slowly, laying down with legs up as needed for safety, performing leg exercises prior to standing to improve BP; she verbalized understanding of education    Exercises     Shoulder Instructions      Home Living Family/patient expects to be discharged to:: Private residence Living Arrangements: Spouse/significant other Available Help at Discharge: Family;Available 24 hours/day Type of Home: House Home Access: Ramped entrance     Home Layout: One level     Bathroom Shower/Tub: Teacher, early years/pre: Standard     Home Equipment: Rollator (4 wheels);Transport chair;Shower seat;Other (comment) (adjustable bed)          Prior Functioning/Environment Prior Level of Function : Needs assist             Mobility Comments: multiple falls in January and has since been mainly w/c or bed bound, ambulating minimally with rollator and assistance by family, often being rolled around in the rollator or transport chair by family ADLs Comments: Needs assistance with all ADLs, including self feeding (due to neuropathy per pt)        OT Problem List: Decreased strength;Decreased activity tolerance;Impaired balance (sitting and/or standing);Decreased coordination;Impaired sensation;Decreased cognition;Decreased knowledge of use of DME or AE      OT Treatment/Interventions: Self-care/ADL training;Therapeutic exercise;Energy conservation;DME and/or AE instruction;Therapeutic activities;Patient/family education    OT Goals(Current goals can be found in  the care plan section) Acute Rehab OT  Goals Patient Stated Goal: avoid falls, home soon OT Goal Formulation: With patient Time For Goal Achievement: 10/06/22 Potential to Achieve Goals: Fair  OT Frequency: Min 2X/week    Co-evaluation PT/OT/SLP Co-Evaluation/Treatment: Yes Reason for Co-Treatment: For patient/therapist safety;To address functional/ADL transfers PT goals addressed during session: Mobility/safety with mobility;Balance;Proper use of DME OT goals addressed during session: ADL's and self-care;Strengthening/ROM;Proper use of Adaptive equipment and DME      AM-PAC OT "6 Clicks" Daily Activity     Outcome Measure Help from another person eating meals?: A Lot Help from another person taking care of personal grooming?: A Lot Help from another person toileting, which includes using toliet, bedpan, or urinal?: Total Help from another person bathing (including washing, rinsing, drying)?: A Lot Help from another person to put on and taking off regular upper body clothing?: A Lot Help from another person to put on and taking off regular lower body clothing?: Total 6 Click Score: 10   End of Session Equipment Utilized During Treatment: Gait belt;Rolling walker (2 wheels) Nurse Communication: Mobility status  Activity Tolerance: Patient limited by fatigue Patient left: in bed;with call bell/phone within reach;with bed alarm set  OT Visit Diagnosis: Unsteadiness on feet (R26.81);Other abnormalities of gait and mobility (R26.89);Muscle weakness (generalized) (M62.81)                Time: [id number] OT Time Calculation (min): 21 min Charges:  OT General Charges $OT Visit: 1 Visit OT Evaluation $OT Eval Low Complexity: 1 Low  Malachy Chamber, OTR/L Acute Rehab Services Office: [phone]   Layla Maw 09/22/2022, 12:34 PM

## 2022-09-22 NOTE — Progress Notes (Signed)
[facility]) Hospital Liaison Note   [name] is a pending [place] hospice referral.  Patient is not an active patient with [place] [facility] services. [facility] will follow for discharge disposition.    Please call with any hospice questions or concerns.   Thank you.   Zigmund Gottron, RN [facility] Liaison [phone]

## 2022-09-22 NOTE — TOC Transition Note (Signed)
Transition of Care [facility]) - CM/SW Discharge Note   Patient Details  Name: Veronica Shaw MRN: [id number] Date of Birth: 1951/03/12  Transition of Care [facility]) CM/SW Contact:  Pollie Friar, RN Phone Number: 09/22/2022, 2:12 PM   Clinical Narrative:    Pt and her granddaughter have decided to d/c home with hospice services instead of [facility]. She was active with [facility] for PT/OT. CM has called and cancelled their services. She was active with [facility] for palliative care. Granddaughter wants to use them for hospice at home. CM has updated Sarah with [facility]. DME at home: transport chair/ hospital bed Granddaughter asking for: over the bed table and BSC--Sarah with [facility] aware and will order.  Pt has transportation home when discharged.   Final next level of care: Home w Hospice Care Barriers to Discharge: No Barriers Identified   Patient Goals and CMS Choice CMS Medicare.gov Compare Post Acute Care list provided to:: Patient Represenative (must comment) Choice offered to / list presented to :  (Granddaughter)  Discharge Placement                         Discharge Plan and Services Additional resources added to the After Visit Summary for                  DME Arranged: 3-N-1, Overbed table DME Agency:  (hospice updated) Date DME Agency Contacted: 09/22/22   Representative spoke with at DME Agency: Judson Roch with [facility]            Social Determinants of Health ([place]) Interventions SDOH Screenings   Food Insecurity: No Food Insecurity (07/01/2020)  Housing: Low Risk  (07/01/2020)  Transportation Needs: No Transportation Needs (07/01/2020)  Alcohol Screen: Low Risk  (07/01/2020)  Depression (PHQ2-9): High Risk (08/29/2022)  Financial Resource Strain: Low Risk  (07/01/2020)  Physical Activity: Sufficiently Active (07/01/2020)  Social Connections: Moderately Integrated (07/01/2020)  Stress: No Stress Concern Present (07/01/2020)  Tobacco Use: Low Risk   (09/21/2022)     Readmission Risk Interventions     No data to display

## 2022-09-22 NOTE — Evaluation (Signed)
Physical Therapy Evaluation Patient Details Name: Veronica Shaw MRN: [id number] DOB: Jan 21, 1951 Today's Date: 09/22/2022  History of Present Illness  Pt is a 72 y.o. female who presented 09/21/22 with syncopal episode. CT of head negative for acute intracranial abnormality. PMH: DM2, HTN, Parkinson's, asthma, HLD, and CHF with defibrillator   Clinical Impression  Pt presents with condition above and deficits mentioned below, see PT Problem List. Pt lives with her husband in a 1-level house with a ramped entrance. She has children that come to assist her as able as well. Pt reports she was ambulatory in January but began to fall often and has since been primarily w/c or bed bound, only ambulating minimally with her rollator and assistance from family. She and chart report pt is pending hospice referral. Pt currently is requiring up to [place] for bed mobility and modAx2 to transfer to stand with a RW. She was unable to maintain standing long to take steps, fatiguing quickly. She demonstrates deficits in balance, activity tolerance, overall strength, and power and is at high risk for falls. Provided family can provide the level of care she needs, recommend follow-up with HHPT. Recommending 3in1 and w/c for safer mobility at d/c. Will continue to follow acutely.  Orthostatic Vitals -  129/53 (75) & 97 bpm supine 137/62 (81) & 105 bpm sitting 117/65 (78) & 118 bpm standing unable to stand 3 min for final orthostatic read  *reported lightheadedness with positional changes     Recommendations for follow up therapy are one component of a multi-disciplinary discharge planning process, led by the attending physician.  Recommendations may be updated based on patient status, additional functional criteria and insurance authorization.  Follow Up Recommendations Home health PT      Assistance Recommended at Discharge Intermittent Supervision/Assistance  Patient can return home with the following  Two  people to help with walking and/or transfers;A lot of help with bathing/dressing/bathroom;Assistance with cooking/housework;Assistance with feeding;Direct supervision/assist for medications management;Direct supervision/assist for financial management;Assist for transportation;Help with stairs or ramp for entrance    Equipment Recommendations BSC/3in1;Wheelchair (measurements PT);Wheelchair cushion (measurements PT)  Recommendations for Other Services       Functional Status Assessment Patient has had a recent decline in their functional status and demonstrates the ability to make significant improvements in function in a reasonable and predictable amount of time.     Precautions / Restrictions Precautions Precautions: Fall;Other (comment) Precaution Comments: watch BP Restrictions Weight Bearing Restrictions: No      Mobility  Bed Mobility Overal bed mobility: Needs Assistance Bed Mobility: Supine to Sit, Sit to Supine     Supine to sit: +2 for safety/equipment, HOB elevated, Mod assist Sit to supine: Min assist, +2 for safety/equipment, HOB elevated   General bed mobility comments: ModA to guide legs off EOB, but primarily to ascend trunk and scoot hips to edge using bed pad. MinA to manage legs back onto bed to return to supine    Transfers Overall transfer level: Needs assistance Equipment used: Rolling walker (2 wheels) Transfers: Sit to/from Stand Sit to Stand: Mod assist, +2 physical assistance, +2 safety/equipment           General transfer comment: ModAx2 to power up to stand and steady pt at RW with pt pulling up on RW. Provided verbal and tactile cues for pt to extend hips and trunk to gain standing balance, but she displayed a posterior bias and increasing trunk flexion as time in standing progressed.    Ambulation/Gait  General Gait Details: unable, minimally ambulating at baseline  Stairs            Wheelchair Mobility     Modified Rankin (Stroke Patients Only) Modified Rankin (Stroke Patients Only) Pre-Morbid Rankin Score: Moderately severe disability Modified Rankin: Severe disability     Balance Overall balance assessment: Needs assistance Sitting-balance support: Bilateral upper extremity supported, Feet supported Sitting balance-Leahy Scale: Poor Sitting balance - Comments: Posterior bias, needing min-modA for static sitting balance with brief episodes of min guard Postural control: Posterior lean Standing balance support: Bilateral upper extremity supported, During functional activity, Reliant on assistive device for balance Standing balance-Leahy Scale: Poor Standing balance comment: Reliant on RW and modAx2 to stand, displaying a posterior bias and very flexed posture; stood ~45 sec                             Pertinent Vitals/Pain Pain Assessment Pain Assessment: Faces Faces Pain Scale: Hurts even more Pain Location: neck Pain Descriptors / Indicators: Discomfort, Grimacing, Guarding Pain Intervention(s): Limited activity within patient's tolerance, Monitored during session, Repositioned    Home Living Family/patient expects to be discharged to:: Private residence Living Arrangements: Spouse/significant other Available Help at Discharge: Family;Available 24 hours/day Type of Home: House Home Access: Ramped entrance       Home Layout: One level Home Equipment: Rollator (4 wheels);Transport chair;Shower seat;Other (comment) (adjustable bed)      Prior Function Prior Level of Function : Needs assist             Mobility Comments: multiple falls in January and has since been mainly w/c or bed bound, ambulating minimally with rollator and assistance by family, often being rolled around in the rollator or transport chair by family ADLs Comments: Needs assistance with all ADLs     Hand Dominance        Extremity/Trunk Assessment   Upper Extremity  Assessment Upper Extremity Assessment: Defer to OT evaluation    Lower Extremity Assessment Lower Extremity Assessment: Generalized weakness    Cervical / Trunk Assessment Cervical / Trunk Assessment: Kyphotic (chronic neck pain)  Communication   Communication: No difficulties  Cognition Arousal/Alertness: Awake/alert Behavior During Therapy: WFL for tasks assessed/performed Overall Cognitive Status: Within Functional Limits for tasks assessed                                 General Comments: Slow processing, but likely baseline        General Comments General comments (skin integrity, edema, etc.): Vitals - 129/53 (75) & 97 bpm supine, 137/62 (81) & 105 bpm sitting, 117/65 (78) & 118 bpm standing, unable to stand 3 min for final orthostatic read; reported lightheadedness with positional changes; educated pt on changing positions slowly, laying down with legs up as needed for safety, performing leg exercises prior to standing to improve BP; she verbalized understanding of education    Exercises     Assessment/Plan    PT Assessment Patient needs continued PT services  PT Problem List Decreased strength;Decreased activity tolerance;Decreased balance;Decreased mobility;Decreased coordination;Cardiopulmonary status limiting activity       PT Treatment Interventions DME instruction;Gait training;Functional mobility training;Therapeutic activities;Therapeutic exercise;Balance training;Neuromuscular re-education;Patient/family education;Wheelchair mobility training    PT Goals (Current goals can be found in the Care Plan section)  Acute Rehab PT Goals Patient Stated Goal: to go home PT Goal Formulation: With patient Time For Goal  Achievement: 10/06/22 Potential to Achieve Goals: Fair    Frequency Min 3X/week     Co-evaluation PT/OT/SLP Co-Evaluation/Treatment: Yes Reason for Co-Treatment: For patient/therapist safety;To address functional/ADL transfers PT  goals addressed during session: Mobility/safety with mobility;Balance;Proper use of DME         AM-PAC PT "6 Clicks" Mobility  Outcome Measure Help needed turning from your back to your side while in a flat bed without using bedrails?: A Lot Help needed moving from lying on your back to sitting on the side of a flat bed without using bedrails?: A Lot Help needed moving to and from a bed to a chair (including a wheelchair)?: Total Help needed standing up from a chair using your arms (e.g., wheelchair or bedside chair)?: Total Help needed to walk in hospital room?: Total Help needed climbing 3-5 steps with a railing? : Total 6 Click Score: 8    End of Session Equipment Utilized During Treatment: Gait belt Activity Tolerance: Other (comment);Patient limited by fatigue (limited by drop in BP with symptoms) Patient left: in bed;with call bell/phone within reach;with bed alarm set Nurse Communication: Other (comment) (vitals) PT Visit Diagnosis: Unsteadiness on feet (R26.81);Other abnormalities of gait and mobility (R26.89);Muscle weakness (generalized) (M62.81);History of falling (Z91.81);Repeated falls (R29.6);Difficulty in walking, not elsewhere classified (R26.2);Dizziness and giddiness (R42)    Time: [id number] PT Time Calculation (min) (ACUTE ONLY): 22 min   Charges:   PT Evaluation $PT Eval Moderate Complexity: 1 Mod          Moishe Spice, PT, DPT Acute Rehabilitation Services  Office: [phone]   Orvan Falconer 09/22/2022, 11:00 AM

## 2022-09-23 ENCOUNTER — Ambulatory Visit: Payer: Medicare Other | Admitting: Pulmonary Disease

## 2022-09-23 DIAGNOSIS — G8194 Hemiplegia, unspecified affecting left nondominant side: Secondary | ICD-10-CM | POA: Diagnosis not present

## 2022-09-23 DIAGNOSIS — K219 Gastro-esophageal reflux disease without esophagitis: Secondary | ICD-10-CM | POA: Diagnosis not present

## 2022-09-23 DIAGNOSIS — N952 Postmenopausal atrophic vaginitis: Secondary | ICD-10-CM | POA: Diagnosis not present

## 2022-09-23 DIAGNOSIS — I951 Orthostatic hypotension: Secondary | ICD-10-CM | POA: Diagnosis not present

## 2022-09-23 DIAGNOSIS — R55 Syncope and collapse: Secondary | ICD-10-CM | POA: Diagnosis not present

## 2022-09-23 DIAGNOSIS — I428 Other cardiomyopathies: Secondary | ICD-10-CM | POA: Diagnosis not present

## 2022-09-23 DIAGNOSIS — G20A1 Parkinson's disease without dyskinesia, without mention of fluctuations: Secondary | ICD-10-CM | POA: Diagnosis not present

## 2022-09-23 DIAGNOSIS — I509 Heart failure, unspecified: Secondary | ICD-10-CM | POA: Diagnosis not present

## 2022-09-23 DIAGNOSIS — I959 Hypotension, unspecified: Secondary | ICD-10-CM | POA: Diagnosis not present

## 2022-09-23 DIAGNOSIS — R630 Anorexia: Secondary | ICD-10-CM | POA: Diagnosis not present

## 2022-09-23 DIAGNOSIS — R000 Tachycardia, unspecified: Secondary | ICD-10-CM | POA: Diagnosis not present

## 2022-09-23 DIAGNOSIS — M479 Spondylosis, unspecified: Secondary | ICD-10-CM | POA: Diagnosis not present

## 2022-09-23 DIAGNOSIS — I110 Hypertensive heart disease with heart failure: Secondary | ICD-10-CM | POA: Diagnosis not present

## 2022-09-23 DIAGNOSIS — G4734 Idiopathic sleep related nonobstructive alveolar hypoventilation: Secondary | ICD-10-CM

## 2022-09-23 DIAGNOSIS — E119 Type 2 diabetes mellitus without complications: Secondary | ICD-10-CM | POA: Diagnosis not present

## 2022-09-23 DIAGNOSIS — E785 Hyperlipidemia, unspecified: Secondary | ICD-10-CM | POA: Diagnosis not present

## 2022-09-23 LAB — GLUCOSE, CAPILLARY: Glucose-Capillary: 139 mg/dL — ABNORMAL HIGH (ref 70–99)

## 2022-09-23 MED ORDER — FUROSEMIDE 20 MG PO TABS: 20.0000 mg | ORAL_TABLET | Freq: Every day | ORAL | 3 refills | Status: DC | PRN

## 2022-09-23 MED ORDER — POLYETHYLENE GLYCOL 3350 17 G PO PACK: 17.0000 g | PACK | Freq: Every day | ORAL | 0 refills | Status: AC

## 2022-09-23 MED ORDER — LEVALBUTEROL HCL 0.63 MG/3ML IN NEBU
0.6300 mg | INHALATION_SOLUTION | Freq: Once | RESPIRATORY_TRACT | Status: AC
  Administered 2022-09-23: 0.63 mg via RESPIRATORY_TRACT
  Filled 2022-09-23: qty 3

## 2022-09-23 MED ORDER — POTASSIUM CHLORIDE ER 10 MEQ PO TBCR: 10.0000 meq | EXTENDED_RELEASE_TABLET | Freq: Every day | ORAL | 0 refills | Status: DC | PRN

## 2022-09-23 NOTE — Care Management Obs Status (Signed)
[place] NOTIFICATION   Patient Details  Name: Veronica Shaw MRN: [id number] Date of Birth: 1951-03-24   Medicare Observation Status Notification Given:  Yes    Pollie Friar, RN 09/23/2022, 10:54 AM

## 2022-09-23 NOTE — Progress Notes (Incomplete)
Discharge instructions discussed with patient. All questions answered. Telemetry discontinued. Patient belongings sent with patient. Patient to be wheeled off unit for transport home by granddaughter.  Gwendolyn Grant, RN

## 2022-09-23 NOTE — Progress Notes (Signed)
[facility])   Plan is for patient to discharge on 2.23 via POV/  Patient is a FULL CODE. Please provide prescriptions at discharge as needed to ensure ongoing symptom management and a transport packet.   [facility] information and contact numbers given to family and above information shared with TOC.    Please call with any questions/concerns.    Thank you for the opportunity to participate in this patient's care   Phillis Haggis, MSW [facility] Liaison  [phone]

## 2022-09-23 NOTE — TOC Transition Note (Signed)
Transition of Care [facility]) - CM/SW Discharge Note   Patient Details  Name: Veronica Shaw MRN: [id number] Date of Birth: 02-08-1951  [facility]) CM/SW Contact:  Pollie Friar, RN Phone Number: 09/23/2022, 10:58 AM   Clinical Narrative:    Pt is discharging home with [facility] for hospice at home. Lorayne Bender with [facility] is aware and the admitting RN for [facility] will be at the home by 4 pm.  Lorayne Bender will f/u with the DME they ordered. Pt has the DME already at home she needs for d/c.  Granddaughter to provide transport home.    Final next level of care: Home w Hospice Care Barriers to Discharge: No Barriers Identified   Patient Goals and CMS Choice CMS Medicare.gov Compare Post Acute Care list provided to:: Patient Represenative (must comment) Choice offered to / list presented to :  (Granddaughter)  Discharge Placement                         Discharge Plan and Services Additional resources added to the After Visit Summary for                  DME Arranged: 3-N-1, Overbed table DME Agency:  (hospice updated) Date DME Agency Contacted: 09/22/22   Representative spoke with at DME Agency: Judson Roch with [facility]            Social Determinants of Health ([place]) Interventions SDOH Screenings   Food Insecurity: No Food Insecurity (07/01/2020)  Housing: Low Risk  (07/01/2020)  Transportation Needs: No Transportation Needs (07/01/2020)  Alcohol Screen: Low Risk  (07/01/2020)  Depression (PHQ2-9): High Risk (08/29/2022)  Financial Resource Strain: Low Risk  (07/01/2020)  Physical Activity: Sufficiently Active (07/01/2020)  Social Connections: Moderately Integrated (07/01/2020)  Stress: No Stress Concern Present (07/01/2020)  Tobacco Use: Low Risk  (09/21/2022)     Readmission Risk Interventions     No data to display

## 2022-09-24 DIAGNOSIS — R630 Anorexia: Secondary | ICD-10-CM | POA: Diagnosis not present

## 2022-09-24 DIAGNOSIS — G20A1 Parkinson's disease without dyskinesia, without mention of fluctuations: Secondary | ICD-10-CM | POA: Diagnosis not present

## 2022-09-24 DIAGNOSIS — E119 Type 2 diabetes mellitus without complications: Secondary | ICD-10-CM | POA: Diagnosis not present

## 2022-09-24 DIAGNOSIS — G8194 Hemiplegia, unspecified affecting left nondominant side: Secondary | ICD-10-CM | POA: Diagnosis not present

## 2022-09-24 DIAGNOSIS — I110 Hypertensive heart disease with heart failure: Secondary | ICD-10-CM | POA: Diagnosis not present

## 2022-09-24 DIAGNOSIS — I951 Orthostatic hypotension: Secondary | ICD-10-CM | POA: Diagnosis not present

## 2022-09-26 ENCOUNTER — Inpatient Hospital Stay: Payer: Self-pay | Admitting: Family Medicine

## 2022-09-26 DIAGNOSIS — G20A1 Parkinson's disease without dyskinesia, without mention of fluctuations: Secondary | ICD-10-CM | POA: Diagnosis not present

## 2022-09-26 DIAGNOSIS — R630 Anorexia: Secondary | ICD-10-CM | POA: Diagnosis not present

## 2022-09-26 DIAGNOSIS — I110 Hypertensive heart disease with heart failure: Secondary | ICD-10-CM | POA: Diagnosis not present

## 2022-09-26 DIAGNOSIS — I951 Orthostatic hypotension: Secondary | ICD-10-CM | POA: Diagnosis not present

## 2022-09-26 DIAGNOSIS — G8194 Hemiplegia, unspecified affecting left nondominant side: Secondary | ICD-10-CM | POA: Diagnosis not present

## 2022-09-26 DIAGNOSIS — E119 Type 2 diabetes mellitus without complications: Secondary | ICD-10-CM | POA: Diagnosis not present

## 2022-09-27 DIAGNOSIS — G8194 Hemiplegia, unspecified affecting left nondominant side: Secondary | ICD-10-CM | POA: Diagnosis not present

## 2022-09-27 DIAGNOSIS — R630 Anorexia: Secondary | ICD-10-CM | POA: Diagnosis not present

## 2022-09-27 DIAGNOSIS — I110 Hypertensive heart disease with heart failure: Secondary | ICD-10-CM | POA: Diagnosis not present

## 2022-09-27 DIAGNOSIS — I951 Orthostatic hypotension: Secondary | ICD-10-CM | POA: Diagnosis not present

## 2022-09-27 DIAGNOSIS — E119 Type 2 diabetes mellitus without complications: Secondary | ICD-10-CM | POA: Diagnosis not present

## 2022-09-27 DIAGNOSIS — G20A1 Parkinson's disease without dyskinesia, without mention of fluctuations: Secondary | ICD-10-CM | POA: Diagnosis not present

## 2022-09-28 ENCOUNTER — Inpatient Hospital Stay: Payer: Self-pay | Admitting: Student

## 2022-09-28 ENCOUNTER — Other Ambulatory Visit: Payer: Medicare Other

## 2022-09-28 DIAGNOSIS — G20A1 Parkinson's disease without dyskinesia, without mention of fluctuations: Secondary | ICD-10-CM | POA: Diagnosis not present

## 2022-09-28 DIAGNOSIS — G8194 Hemiplegia, unspecified affecting left nondominant side: Secondary | ICD-10-CM | POA: Diagnosis not present

## 2022-09-28 DIAGNOSIS — I951 Orthostatic hypotension: Secondary | ICD-10-CM | POA: Diagnosis not present

## 2022-09-28 DIAGNOSIS — I110 Hypertensive heart disease with heart failure: Secondary | ICD-10-CM | POA: Diagnosis not present

## 2022-09-28 DIAGNOSIS — E119 Type 2 diabetes mellitus without complications: Secondary | ICD-10-CM | POA: Diagnosis not present

## 2022-09-28 DIAGNOSIS — R630 Anorexia: Secondary | ICD-10-CM | POA: Diagnosis not present

## 2022-09-30 ENCOUNTER — Other Ambulatory Visit: Payer: No Typology Code available for payment source

## 2022-09-30 DIAGNOSIS — G20A1 Parkinson's disease without dyskinesia, without mention of fluctuations: Secondary | ICD-10-CM | POA: Diagnosis not present

## 2022-09-30 DIAGNOSIS — N952 Postmenopausal atrophic vaginitis: Secondary | ICD-10-CM | POA: Diagnosis not present

## 2022-09-30 DIAGNOSIS — R630 Anorexia: Secondary | ICD-10-CM | POA: Diagnosis not present

## 2022-09-30 DIAGNOSIS — M479 Spondylosis, unspecified: Secondary | ICD-10-CM | POA: Diagnosis not present

## 2022-09-30 DIAGNOSIS — E785 Hyperlipidemia, unspecified: Secondary | ICD-10-CM | POA: Diagnosis not present

## 2022-09-30 DIAGNOSIS — I509 Heart failure, unspecified: Secondary | ICD-10-CM | POA: Diagnosis not present

## 2022-09-30 DIAGNOSIS — R000 Tachycardia, unspecified: Secondary | ICD-10-CM | POA: Diagnosis not present

## 2022-09-30 DIAGNOSIS — G8194 Hemiplegia, unspecified affecting left nondominant side: Secondary | ICD-10-CM | POA: Diagnosis not present

## 2022-09-30 DIAGNOSIS — I951 Orthostatic hypotension: Secondary | ICD-10-CM | POA: Diagnosis not present

## 2022-09-30 DIAGNOSIS — K219 Gastro-esophageal reflux disease without esophagitis: Secondary | ICD-10-CM | POA: Diagnosis not present

## 2022-09-30 DIAGNOSIS — I110 Hypertensive heart disease with heart failure: Secondary | ICD-10-CM | POA: Diagnosis not present

## 2022-09-30 DIAGNOSIS — I428 Other cardiomyopathies: Secondary | ICD-10-CM | POA: Diagnosis not present

## 2022-09-30 DIAGNOSIS — E119 Type 2 diabetes mellitus without complications: Secondary | ICD-10-CM | POA: Diagnosis not present

## 2022-09-30 DIAGNOSIS — J302 Other seasonal allergic rhinitis: Secondary | ICD-10-CM | POA: Diagnosis not present

## 2022-10-04 DIAGNOSIS — E119 Type 2 diabetes mellitus without complications: Secondary | ICD-10-CM | POA: Diagnosis not present

## 2022-10-04 DIAGNOSIS — G20A1 Parkinson's disease without dyskinesia, without mention of fluctuations: Secondary | ICD-10-CM | POA: Diagnosis not present

## 2022-10-04 DIAGNOSIS — I951 Orthostatic hypotension: Secondary | ICD-10-CM | POA: Diagnosis not present

## 2022-10-04 DIAGNOSIS — G8194 Hemiplegia, unspecified affecting left nondominant side: Secondary | ICD-10-CM | POA: Diagnosis not present

## 2022-10-04 DIAGNOSIS — I110 Hypertensive heart disease with heart failure: Secondary | ICD-10-CM | POA: Diagnosis not present

## 2022-10-04 DIAGNOSIS — R630 Anorexia: Secondary | ICD-10-CM | POA: Diagnosis not present

## 2022-10-05 ENCOUNTER — Ambulatory Visit: Payer: Self-pay

## 2022-10-05 DIAGNOSIS — G8194 Hemiplegia, unspecified affecting left nondominant side: Secondary | ICD-10-CM | POA: Diagnosis not present

## 2022-10-05 DIAGNOSIS — G20A1 Parkinson's disease without dyskinesia, without mention of fluctuations: Secondary | ICD-10-CM | POA: Diagnosis not present

## 2022-10-05 DIAGNOSIS — I110 Hypertensive heart disease with heart failure: Secondary | ICD-10-CM | POA: Diagnosis not present

## 2022-10-05 DIAGNOSIS — I951 Orthostatic hypotension: Secondary | ICD-10-CM | POA: Diagnosis not present

## 2022-10-05 DIAGNOSIS — R630 Anorexia: Secondary | ICD-10-CM | POA: Diagnosis not present

## 2022-10-05 DIAGNOSIS — E119 Type 2 diabetes mellitus without complications: Secondary | ICD-10-CM | POA: Diagnosis not present

## 2022-10-05 NOTE — Patient Outreach (Signed)
  Care Coordination   Follow Up Visit Note   10/05/2022 Name: Giliana Norberg MRN: [id number] DOB: [birth year]  Cheyne Barry Agosta is a 72 y.o. year old female who sees Gerrit Heck, MD for primary care. I spoke with  Terie Purser by phone today.  What matters to the patients health and wellness today?  [name] returned my call and stated that she was in the hospital from 09/21/22 to 09/23/22 for a syncopal episode and was discharged home with Hospice. They have supplied her with a hospital bed, wheelchair, and diapers. She has three nurses who come out to see her on Monday, Wednesday, and Friday. They are also handling her medications. I explained to her with all the help that she had, I would not be calling anymore. She thanked me for my help.    Goals Addressed             This Visit's Progress    COMPLETED: Patient to manage and monitor signs and symptoms of Heart Failure and Falls       Patient Goals/Self Care Activities: -Patient/Caregiver will self-administer medications as prescribed as evidenced by self-report/primary caregiver report  -Patient/Caregiver will attend all scheduled provider appointments as evidenced by clinician review of documented attendance to scheduled appointments and patient/caregiver report -Patient/Caregiver will call pharmacy for medication refills as evidenced by patient report and review of pharmacy fill history as appropriate -Patient/Caregiver will call provider office for new concerns or questions as evidenced by review of documented incoming telephone call notes and patient report -Patient/Caregiver verbalizes understanding of plan  -Weigh daily and record (notify MD with 3 lb weight gain over night or 5 lb in a week) -Follow CHF Action Plan -Adhere to low sodium diet  -Utilize wheelchair (assistive device) appropriately with all ambulation -De-clutter walkways -Change positions slowly -Always wear secure fitting shoes with  ambulation -Utilize home lighting for dim lit areas          SDOH assessments and interventions completed:  No     Care Coordination Interventions:  No, not indicated   Follow up plan: No further intervention required.   Encounter Outcome:  Pt. Visit Completed   Lazaro Arms RN, BSN, [facility]   Phone: [phone]

## 2022-10-05 NOTE — Patient Instructions (Signed)
Visit Information  Thank you for taking time to visit with me today. Please don't hesitate to contact me if I can be of assistance to you.   Following are the goals we discussed today:   Goals Addressed             This Visit's Progress    COMPLETED: Patient to manage and monitor signs and symptoms of Heart Failure and Falls       Patient Goals/Self Care Activities: -Patient/Caregiver will self-administer medications as prescribed as evidenced by self-report/primary caregiver report  -Patient/Caregiver will attend all scheduled provider appointments as evidenced by clinician review of documented attendance to scheduled appointments and patient/caregiver report -Patient/Caregiver will call pharmacy for medication refills as evidenced by patient report and review of pharmacy fill history as appropriate -Patient/Caregiver will call provider office for new concerns or questions as evidenced by review of documented incoming telephone call notes and patient report -Patient/Caregiver verbalizes understanding of plan  -Weigh daily and record (notify MD with 3 lb weight gain over night or 5 lb in a week) -Follow CHF Action Plan -Adhere to low sodium diet  -Utilize wheelchair (assistive device) appropriately with all ambulation -De-clutter walkways -Change positions slowly -Always wear secure fitting shoes with ambulation -Utilize home lighting for dim lit areas         Patient verbalizes understanding of instructions and care plan provided today and agrees to view in [place]. Active MyChart status and patient understanding of how to access instructions and care plan via MyChart confirmed with patient.     No further follow up required  Lazaro Arms RN, BSN, [facility]   Phone: [phone]

## 2022-10-06 ENCOUNTER — Telehealth: Payer: Self-pay | Admitting: Pharmacy Technician

## 2022-10-06 DIAGNOSIS — Z596 Low income: Secondary | ICD-10-CM

## 2022-10-06 NOTE — Progress Notes (Signed)
[facility])                                            [place] Team    10/06/2022  Veronica Shaw 07/23/1951 [id number]  Received both patient and provider portion(s) of patient assistance application(s) for [facility]. Faxed completed application and required documents into [place].   Kordae Buonocore P. Kipper Buch, [place]  [phone]

## 2022-10-07 DIAGNOSIS — G20A1 Parkinson's disease without dyskinesia, without mention of fluctuations: Secondary | ICD-10-CM | POA: Diagnosis not present

## 2022-10-07 DIAGNOSIS — I951 Orthostatic hypotension: Secondary | ICD-10-CM | POA: Diagnosis not present

## 2022-10-07 DIAGNOSIS — R630 Anorexia: Secondary | ICD-10-CM | POA: Diagnosis not present

## 2022-10-07 DIAGNOSIS — G8194 Hemiplegia, unspecified affecting left nondominant side: Secondary | ICD-10-CM | POA: Diagnosis not present

## 2022-10-07 DIAGNOSIS — I110 Hypertensive heart disease with heart failure: Secondary | ICD-10-CM | POA: Diagnosis not present

## 2022-10-07 DIAGNOSIS — E119 Type 2 diabetes mellitus without complications: Secondary | ICD-10-CM | POA: Diagnosis not present

## 2022-10-10 ENCOUNTER — Ambulatory Visit: Payer: Medicare Other | Attending: Internal Medicine

## 2022-10-10 DIAGNOSIS — I5022 Chronic systolic (congestive) heart failure: Secondary | ICD-10-CM | POA: Diagnosis not present

## 2022-10-10 DIAGNOSIS — G8194 Hemiplegia, unspecified affecting left nondominant side: Secondary | ICD-10-CM | POA: Diagnosis not present

## 2022-10-10 DIAGNOSIS — G20A1 Parkinson's disease without dyskinesia, without mention of fluctuations: Secondary | ICD-10-CM | POA: Diagnosis not present

## 2022-10-10 DIAGNOSIS — R630 Anorexia: Secondary | ICD-10-CM | POA: Diagnosis not present

## 2022-10-10 DIAGNOSIS — Z95810 Presence of automatic (implantable) cardiac defibrillator: Secondary | ICD-10-CM | POA: Diagnosis not present

## 2022-10-10 DIAGNOSIS — I110 Hypertensive heart disease with heart failure: Secondary | ICD-10-CM | POA: Diagnosis not present

## 2022-10-10 DIAGNOSIS — I951 Orthostatic hypotension: Secondary | ICD-10-CM | POA: Diagnosis not present

## 2022-10-10 DIAGNOSIS — E119 Type 2 diabetes mellitus without complications: Secondary | ICD-10-CM | POA: Diagnosis not present

## 2022-10-10 NOTE — Progress Notes (Unsigned)
EPIC Encounter for ICM Monitoring  Patient Name: Veronica Shaw is a 72 y.o. female Date: 10/10/2022 Primary Care Physican: Gerrit Heck, MD Primary Cardiologist: Cooper/Weaver PA Electrophysiologist: Lovena Le 05/06/2022 Weight: 119 lbs 06/17/2022 Weight: 119 lbs                                  Spoke with patient and heart failure questions reviewed.  Transmission results reviewed.  Pt reports she is now on hospice/palliative care and bedridden.  She is not doing well.  Decreased impedance may be related to recent hospitalization.   OptiVol Thoracic impedance suggesting possible fluid accumulation starting 2/20 and returning to normal on 3/11.    Prescribed:  Furosemide 20 mg Take 1 tablet (20 mg total) by mouth daily except take 2 tablets (40 mg total) every Monday & Thursday.   Potassium 10 mEq take 1 tablet (10 mEq total) by mouth daily   Labs: 08/15/2022 Creatinine 1.18, BUN 15, Potassium 4.1, Sodium 140,  08/12/2022 Creatinine 1.06, BUN 21, Potassium 4.4, Sodium 142, GFR 55 06/28/2022 Creatinine 1.18, BUN 18, Potassium 4.1, Sodium 142, GFR 49 03/19/2022 Creatinine 1.24, BUN 29, Potassium 4.5, Sodium 138 01/06/2022 Creatinine 1.25, BUN 24, Potassium 4.3, Sodium 140 08/20/2021 Creatinine 1.11, BUN 21, Potassium 4.3, Sodium 140, GFR 53 A complete set of results can be found in Results Review.   Recommendations:  No further ICM monthly follow up since patient has transitioned to hospice/palliative care.  91 day device checks will continue per protocol.   Follow-up plan: No further ICM clinic phone appointment.   91 day device clinic remote transmission 11/24/2022.     EP/Cardiology Office Visits:     Recalls 01/17/2022 for [name] (yearly visit).  12/19/2022 with Dr Burt Knack.   Copy of ICM check sent to Dr. Lovena Le.      3 month ICM trend: 10/10/2022.    12-14 Month ICM trend:     [name], RN 10/10/2022 7:56 AM

## 2022-10-11 DIAGNOSIS — G20A1 Parkinson's disease without dyskinesia, without mention of fluctuations: Secondary | ICD-10-CM | POA: Diagnosis not present

## 2022-10-11 DIAGNOSIS — G8194 Hemiplegia, unspecified affecting left nondominant side: Secondary | ICD-10-CM | POA: Diagnosis not present

## 2022-10-11 DIAGNOSIS — I110 Hypertensive heart disease with heart failure: Secondary | ICD-10-CM | POA: Diagnosis not present

## 2022-10-11 DIAGNOSIS — I951 Orthostatic hypotension: Secondary | ICD-10-CM | POA: Diagnosis not present

## 2022-10-11 DIAGNOSIS — E119 Type 2 diabetes mellitus without complications: Secondary | ICD-10-CM | POA: Diagnosis not present

## 2022-10-11 DIAGNOSIS — R630 Anorexia: Secondary | ICD-10-CM | POA: Diagnosis not present

## 2022-10-12 DIAGNOSIS — G8194 Hemiplegia, unspecified affecting left nondominant side: Secondary | ICD-10-CM | POA: Diagnosis not present

## 2022-10-12 DIAGNOSIS — R630 Anorexia: Secondary | ICD-10-CM | POA: Diagnosis not present

## 2022-10-12 DIAGNOSIS — I110 Hypertensive heart disease with heart failure: Secondary | ICD-10-CM | POA: Diagnosis not present

## 2022-10-12 DIAGNOSIS — E119 Type 2 diabetes mellitus without complications: Secondary | ICD-10-CM | POA: Diagnosis not present

## 2022-10-12 DIAGNOSIS — I951 Orthostatic hypotension: Secondary | ICD-10-CM | POA: Diagnosis not present

## 2022-10-12 DIAGNOSIS — G20A1 Parkinson's disease without dyskinesia, without mention of fluctuations: Secondary | ICD-10-CM | POA: Diagnosis not present

## 2022-10-13 ENCOUNTER — Telehealth: Payer: Self-pay | Admitting: Pharmacy Technician

## 2022-10-13 DIAGNOSIS — Z596 Low income: Secondary | ICD-10-CM

## 2022-10-13 NOTE — Progress Notes (Signed)
[facility])                                            [place] Team    10/13/2022  Veronica Shaw [birth year] [id number]  Care coordination call placed to [employer] in regard to [facility] application.  Spoke to [place] who informs patient is APPROVED 10/07/22-08/01/23. Medication will auto fill and ship to patient's address on file approximately every 3 months.  [place] Fritze P. Dilraj Killgore, [place]  [phone]

## 2022-10-14 DIAGNOSIS — G8194 Hemiplegia, unspecified affecting left nondominant side: Secondary | ICD-10-CM | POA: Diagnosis not present

## 2022-10-14 DIAGNOSIS — I951 Orthostatic hypotension: Secondary | ICD-10-CM | POA: Diagnosis not present

## 2022-10-14 DIAGNOSIS — G20A1 Parkinson's disease without dyskinesia, without mention of fluctuations: Secondary | ICD-10-CM | POA: Diagnosis not present

## 2022-10-14 DIAGNOSIS — I110 Hypertensive heart disease with heart failure: Secondary | ICD-10-CM | POA: Diagnosis not present

## 2022-10-14 DIAGNOSIS — E119 Type 2 diabetes mellitus without complications: Secondary | ICD-10-CM | POA: Diagnosis not present

## 2022-10-14 DIAGNOSIS — R630 Anorexia: Secondary | ICD-10-CM | POA: Diagnosis not present

## 2022-10-17 DIAGNOSIS — I951 Orthostatic hypotension: Secondary | ICD-10-CM | POA: Diagnosis not present

## 2022-10-17 DIAGNOSIS — R630 Anorexia: Secondary | ICD-10-CM | POA: Diagnosis not present

## 2022-10-17 DIAGNOSIS — E119 Type 2 diabetes mellitus without complications: Secondary | ICD-10-CM | POA: Diagnosis not present

## 2022-10-17 DIAGNOSIS — G20A1 Parkinson's disease without dyskinesia, without mention of fluctuations: Secondary | ICD-10-CM | POA: Diagnosis not present

## 2022-10-17 DIAGNOSIS — I110 Hypertensive heart disease with heart failure: Secondary | ICD-10-CM | POA: Diagnosis not present

## 2022-10-17 DIAGNOSIS — G8194 Hemiplegia, unspecified affecting left nondominant side: Secondary | ICD-10-CM | POA: Diagnosis not present

## 2022-10-18 DIAGNOSIS — G20A1 Parkinson's disease without dyskinesia, without mention of fluctuations: Secondary | ICD-10-CM | POA: Diagnosis not present

## 2022-10-18 DIAGNOSIS — E119 Type 2 diabetes mellitus without complications: Secondary | ICD-10-CM | POA: Diagnosis not present

## 2022-10-18 DIAGNOSIS — I110 Hypertensive heart disease with heart failure: Secondary | ICD-10-CM | POA: Diagnosis not present

## 2022-10-18 DIAGNOSIS — G8194 Hemiplegia, unspecified affecting left nondominant side: Secondary | ICD-10-CM | POA: Diagnosis not present

## 2022-10-18 DIAGNOSIS — R630 Anorexia: Secondary | ICD-10-CM | POA: Diagnosis not present

## 2022-10-18 DIAGNOSIS — I951 Orthostatic hypotension: Secondary | ICD-10-CM | POA: Diagnosis not present

## 2022-10-19 DIAGNOSIS — G20A1 Parkinson's disease without dyskinesia, without mention of fluctuations: Secondary | ICD-10-CM | POA: Diagnosis not present

## 2022-10-19 DIAGNOSIS — E119 Type 2 diabetes mellitus without complications: Secondary | ICD-10-CM | POA: Diagnosis not present

## 2022-10-19 DIAGNOSIS — I951 Orthostatic hypotension: Secondary | ICD-10-CM | POA: Diagnosis not present

## 2022-10-19 DIAGNOSIS — R630 Anorexia: Secondary | ICD-10-CM | POA: Diagnosis not present

## 2022-10-19 DIAGNOSIS — G8194 Hemiplegia, unspecified affecting left nondominant side: Secondary | ICD-10-CM | POA: Diagnosis not present

## 2022-10-19 DIAGNOSIS — I110 Hypertensive heart disease with heart failure: Secondary | ICD-10-CM | POA: Diagnosis not present

## 2022-10-21 DIAGNOSIS — I951 Orthostatic hypotension: Secondary | ICD-10-CM | POA: Diagnosis not present

## 2022-10-21 DIAGNOSIS — G20A1 Parkinson's disease without dyskinesia, without mention of fluctuations: Secondary | ICD-10-CM | POA: Diagnosis not present

## 2022-10-21 DIAGNOSIS — E119 Type 2 diabetes mellitus without complications: Secondary | ICD-10-CM | POA: Diagnosis not present

## 2022-10-21 DIAGNOSIS — R630 Anorexia: Secondary | ICD-10-CM | POA: Diagnosis not present

## 2022-10-21 DIAGNOSIS — I110 Hypertensive heart disease with heart failure: Secondary | ICD-10-CM | POA: Diagnosis not present

## 2022-10-21 DIAGNOSIS — G8194 Hemiplegia, unspecified affecting left nondominant side: Secondary | ICD-10-CM | POA: Diagnosis not present

## 2022-10-24 DIAGNOSIS — R630 Anorexia: Secondary | ICD-10-CM | POA: Diagnosis not present

## 2022-10-24 DIAGNOSIS — G8194 Hemiplegia, unspecified affecting left nondominant side: Secondary | ICD-10-CM | POA: Diagnosis not present

## 2022-10-24 DIAGNOSIS — G20A1 Parkinson's disease without dyskinesia, without mention of fluctuations: Secondary | ICD-10-CM | POA: Diagnosis not present

## 2022-10-24 DIAGNOSIS — E119 Type 2 diabetes mellitus without complications: Secondary | ICD-10-CM | POA: Diagnosis not present

## 2022-10-24 DIAGNOSIS — I951 Orthostatic hypotension: Secondary | ICD-10-CM | POA: Diagnosis not present

## 2022-10-24 DIAGNOSIS — I110 Hypertensive heart disease with heart failure: Secondary | ICD-10-CM | POA: Diagnosis not present

## 2022-10-25 DIAGNOSIS — E119 Type 2 diabetes mellitus without complications: Secondary | ICD-10-CM | POA: Diagnosis not present

## 2022-10-25 DIAGNOSIS — G20A1 Parkinson's disease without dyskinesia, without mention of fluctuations: Secondary | ICD-10-CM | POA: Diagnosis not present

## 2022-10-25 DIAGNOSIS — I110 Hypertensive heart disease with heart failure: Secondary | ICD-10-CM | POA: Diagnosis not present

## 2022-10-25 DIAGNOSIS — G8194 Hemiplegia, unspecified affecting left nondominant side: Secondary | ICD-10-CM | POA: Diagnosis not present

## 2022-10-25 DIAGNOSIS — R630 Anorexia: Secondary | ICD-10-CM | POA: Diagnosis not present

## 2022-10-25 DIAGNOSIS — I951 Orthostatic hypotension: Secondary | ICD-10-CM | POA: Diagnosis not present

## 2022-10-26 DIAGNOSIS — G8194 Hemiplegia, unspecified affecting left nondominant side: Secondary | ICD-10-CM | POA: Diagnosis not present

## 2022-10-26 DIAGNOSIS — E119 Type 2 diabetes mellitus without complications: Secondary | ICD-10-CM | POA: Diagnosis not present

## 2022-10-26 DIAGNOSIS — I951 Orthostatic hypotension: Secondary | ICD-10-CM | POA: Diagnosis not present

## 2022-10-26 DIAGNOSIS — R630 Anorexia: Secondary | ICD-10-CM | POA: Diagnosis not present

## 2022-10-26 DIAGNOSIS — G20A1 Parkinson's disease without dyskinesia, without mention of fluctuations: Secondary | ICD-10-CM | POA: Diagnosis not present

## 2022-10-26 DIAGNOSIS — I110 Hypertensive heart disease with heart failure: Secondary | ICD-10-CM | POA: Diagnosis not present

## 2022-10-27 DIAGNOSIS — R630 Anorexia: Secondary | ICD-10-CM | POA: Diagnosis not present

## 2022-10-27 DIAGNOSIS — G20A1 Parkinson's disease without dyskinesia, without mention of fluctuations: Secondary | ICD-10-CM | POA: Diagnosis not present

## 2022-10-27 DIAGNOSIS — G8194 Hemiplegia, unspecified affecting left nondominant side: Secondary | ICD-10-CM | POA: Diagnosis not present

## 2022-10-27 DIAGNOSIS — I951 Orthostatic hypotension: Secondary | ICD-10-CM | POA: Diagnosis not present

## 2022-10-27 DIAGNOSIS — I110 Hypertensive heart disease with heart failure: Secondary | ICD-10-CM | POA: Diagnosis not present

## 2022-10-27 DIAGNOSIS — E119 Type 2 diabetes mellitus without complications: Secondary | ICD-10-CM | POA: Diagnosis not present

## 2022-10-28 DIAGNOSIS — I110 Hypertensive heart disease with heart failure: Secondary | ICD-10-CM | POA: Diagnosis not present

## 2022-10-28 DIAGNOSIS — R630 Anorexia: Secondary | ICD-10-CM | POA: Diagnosis not present

## 2022-10-28 DIAGNOSIS — I951 Orthostatic hypotension: Secondary | ICD-10-CM | POA: Diagnosis not present

## 2022-10-28 DIAGNOSIS — G8194 Hemiplegia, unspecified affecting left nondominant side: Secondary | ICD-10-CM | POA: Diagnosis not present

## 2022-10-28 DIAGNOSIS — E119 Type 2 diabetes mellitus without complications: Secondary | ICD-10-CM | POA: Diagnosis not present

## 2022-10-28 DIAGNOSIS — G20A1 Parkinson's disease without dyskinesia, without mention of fluctuations: Secondary | ICD-10-CM | POA: Diagnosis not present

## 2022-10-29 DIAGNOSIS — G8194 Hemiplegia, unspecified affecting left nondominant side: Secondary | ICD-10-CM | POA: Diagnosis not present

## 2022-10-29 DIAGNOSIS — R531 Weakness: Secondary | ICD-10-CM | POA: Diagnosis not present

## 2022-10-29 DIAGNOSIS — I951 Orthostatic hypotension: Secondary | ICD-10-CM | POA: Diagnosis not present

## 2022-10-29 DIAGNOSIS — E119 Type 2 diabetes mellitus without complications: Secondary | ICD-10-CM | POA: Diagnosis not present

## 2022-10-29 DIAGNOSIS — I110 Hypertensive heart disease with heart failure: Secondary | ICD-10-CM | POA: Diagnosis not present

## 2022-10-29 DIAGNOSIS — R630 Anorexia: Secondary | ICD-10-CM | POA: Diagnosis not present

## 2022-10-29 DIAGNOSIS — G20A1 Parkinson's disease without dyskinesia, without mention of fluctuations: Secondary | ICD-10-CM | POA: Diagnosis not present

## 2022-10-29 DIAGNOSIS — Z743 Need for continuous supervision: Secondary | ICD-10-CM | POA: Diagnosis not present

## 2022-10-30 DIAGNOSIS — I951 Orthostatic hypotension: Secondary | ICD-10-CM | POA: Diagnosis not present

## 2022-10-30 DIAGNOSIS — G8194 Hemiplegia, unspecified affecting left nondominant side: Secondary | ICD-10-CM | POA: Diagnosis not present

## 2022-10-30 DIAGNOSIS — I110 Hypertensive heart disease with heart failure: Secondary | ICD-10-CM | POA: Diagnosis not present

## 2022-10-30 DIAGNOSIS — E119 Type 2 diabetes mellitus without complications: Secondary | ICD-10-CM | POA: Diagnosis not present

## 2022-10-30 DIAGNOSIS — R630 Anorexia: Secondary | ICD-10-CM | POA: Diagnosis not present

## 2022-10-30 DIAGNOSIS — G20A1 Parkinson's disease without dyskinesia, without mention of fluctuations: Secondary | ICD-10-CM | POA: Diagnosis not present

## 2022-10-31 DIAGNOSIS — E785 Hyperlipidemia, unspecified: Secondary | ICD-10-CM | POA: Diagnosis not present

## 2022-10-31 DIAGNOSIS — I110 Hypertensive heart disease with heart failure: Secondary | ICD-10-CM | POA: Diagnosis not present

## 2022-10-31 DIAGNOSIS — E119 Type 2 diabetes mellitus without complications: Secondary | ICD-10-CM | POA: Diagnosis not present

## 2022-10-31 DIAGNOSIS — R000 Tachycardia, unspecified: Secondary | ICD-10-CM | POA: Diagnosis not present

## 2022-10-31 DIAGNOSIS — K219 Gastro-esophageal reflux disease without esophagitis: Secondary | ICD-10-CM | POA: Diagnosis not present

## 2022-10-31 DIAGNOSIS — I428 Other cardiomyopathies: Secondary | ICD-10-CM | POA: Diagnosis not present

## 2022-10-31 DIAGNOSIS — J302 Other seasonal allergic rhinitis: Secondary | ICD-10-CM | POA: Diagnosis not present

## 2022-10-31 DIAGNOSIS — M479 Spondylosis, unspecified: Secondary | ICD-10-CM | POA: Diagnosis not present

## 2022-10-31 DIAGNOSIS — I951 Orthostatic hypotension: Secondary | ICD-10-CM | POA: Diagnosis not present

## 2022-10-31 DIAGNOSIS — G20A1 Parkinson's disease without dyskinesia, without mention of fluctuations: Secondary | ICD-10-CM | POA: Diagnosis not present

## 2022-10-31 DIAGNOSIS — I509 Heart failure, unspecified: Secondary | ICD-10-CM | POA: Diagnosis not present

## 2022-10-31 DIAGNOSIS — N952 Postmenopausal atrophic vaginitis: Secondary | ICD-10-CM | POA: Diagnosis not present

## 2022-10-31 DIAGNOSIS — G8194 Hemiplegia, unspecified affecting left nondominant side: Secondary | ICD-10-CM | POA: Diagnosis not present

## 2022-10-31 DIAGNOSIS — R630 Anorexia: Secondary | ICD-10-CM | POA: Diagnosis not present

## 2022-11-02 DIAGNOSIS — I110 Hypertensive heart disease with heart failure: Secondary | ICD-10-CM | POA: Diagnosis not present

## 2022-11-02 DIAGNOSIS — E119 Type 2 diabetes mellitus without complications: Secondary | ICD-10-CM | POA: Diagnosis not present

## 2022-11-02 DIAGNOSIS — G20A1 Parkinson's disease without dyskinesia, without mention of fluctuations: Secondary | ICD-10-CM | POA: Diagnosis not present

## 2022-11-02 DIAGNOSIS — I951 Orthostatic hypotension: Secondary | ICD-10-CM | POA: Diagnosis not present

## 2022-11-02 DIAGNOSIS — G8194 Hemiplegia, unspecified affecting left nondominant side: Secondary | ICD-10-CM | POA: Diagnosis not present

## 2022-11-02 DIAGNOSIS — R630 Anorexia: Secondary | ICD-10-CM | POA: Diagnosis not present

## 2022-11-04 DIAGNOSIS — I951 Orthostatic hypotension: Secondary | ICD-10-CM | POA: Diagnosis not present

## 2022-11-04 DIAGNOSIS — R630 Anorexia: Secondary | ICD-10-CM | POA: Diagnosis not present

## 2022-11-04 DIAGNOSIS — G20A1 Parkinson's disease without dyskinesia, without mention of fluctuations: Secondary | ICD-10-CM | POA: Diagnosis not present

## 2022-11-04 DIAGNOSIS — I110 Hypertensive heart disease with heart failure: Secondary | ICD-10-CM | POA: Diagnosis not present

## 2022-11-04 DIAGNOSIS — G8194 Hemiplegia, unspecified affecting left nondominant side: Secondary | ICD-10-CM | POA: Diagnosis not present

## 2022-11-04 DIAGNOSIS — E119 Type 2 diabetes mellitus without complications: Secondary | ICD-10-CM | POA: Diagnosis not present

## 2022-11-07 DIAGNOSIS — G20A1 Parkinson's disease without dyskinesia, without mention of fluctuations: Secondary | ICD-10-CM | POA: Diagnosis not present

## 2022-11-07 DIAGNOSIS — E119 Type 2 diabetes mellitus without complications: Secondary | ICD-10-CM | POA: Diagnosis not present

## 2022-11-07 DIAGNOSIS — I110 Hypertensive heart disease with heart failure: Secondary | ICD-10-CM | POA: Diagnosis not present

## 2022-11-07 DIAGNOSIS — R630 Anorexia: Secondary | ICD-10-CM | POA: Diagnosis not present

## 2022-11-07 DIAGNOSIS — G8194 Hemiplegia, unspecified affecting left nondominant side: Secondary | ICD-10-CM | POA: Diagnosis not present

## 2022-11-07 DIAGNOSIS — I951 Orthostatic hypotension: Secondary | ICD-10-CM | POA: Diagnosis not present

## 2022-11-09 DIAGNOSIS — R630 Anorexia: Secondary | ICD-10-CM | POA: Diagnosis not present

## 2022-11-09 DIAGNOSIS — I110 Hypertensive heart disease with heart failure: Secondary | ICD-10-CM | POA: Diagnosis not present

## 2022-11-09 DIAGNOSIS — E119 Type 2 diabetes mellitus without complications: Secondary | ICD-10-CM | POA: Diagnosis not present

## 2022-11-09 DIAGNOSIS — G20A1 Parkinson's disease without dyskinesia, without mention of fluctuations: Secondary | ICD-10-CM | POA: Diagnosis not present

## 2022-11-09 DIAGNOSIS — I951 Orthostatic hypotension: Secondary | ICD-10-CM | POA: Diagnosis not present

## 2022-11-09 DIAGNOSIS — G8194 Hemiplegia, unspecified affecting left nondominant side: Secondary | ICD-10-CM | POA: Diagnosis not present

## 2022-11-11 DIAGNOSIS — E119 Type 2 diabetes mellitus without complications: Secondary | ICD-10-CM | POA: Diagnosis not present

## 2022-11-11 DIAGNOSIS — G8194 Hemiplegia, unspecified affecting left nondominant side: Secondary | ICD-10-CM | POA: Diagnosis not present

## 2022-11-11 DIAGNOSIS — G20A1 Parkinson's disease without dyskinesia, without mention of fluctuations: Secondary | ICD-10-CM | POA: Diagnosis not present

## 2022-11-11 DIAGNOSIS — R630 Anorexia: Secondary | ICD-10-CM | POA: Diagnosis not present

## 2022-11-11 DIAGNOSIS — I951 Orthostatic hypotension: Secondary | ICD-10-CM | POA: Diagnosis not present

## 2022-11-11 DIAGNOSIS — I110 Hypertensive heart disease with heart failure: Secondary | ICD-10-CM | POA: Diagnosis not present

## 2022-11-14 DIAGNOSIS — R630 Anorexia: Secondary | ICD-10-CM | POA: Diagnosis not present

## 2022-11-14 DIAGNOSIS — G8194 Hemiplegia, unspecified affecting left nondominant side: Secondary | ICD-10-CM | POA: Diagnosis not present

## 2022-11-14 DIAGNOSIS — E119 Type 2 diabetes mellitus without complications: Secondary | ICD-10-CM | POA: Diagnosis not present

## 2022-11-14 DIAGNOSIS — I951 Orthostatic hypotension: Secondary | ICD-10-CM | POA: Diagnosis not present

## 2022-11-14 DIAGNOSIS — I110 Hypertensive heart disease with heart failure: Secondary | ICD-10-CM | POA: Diagnosis not present

## 2022-11-14 DIAGNOSIS — G20A1 Parkinson's disease without dyskinesia, without mention of fluctuations: Secondary | ICD-10-CM | POA: Diagnosis not present

## 2022-11-15 DIAGNOSIS — G20A1 Parkinson's disease without dyskinesia, without mention of fluctuations: Secondary | ICD-10-CM | POA: Diagnosis not present

## 2022-11-15 DIAGNOSIS — R630 Anorexia: Secondary | ICD-10-CM | POA: Diagnosis not present

## 2022-11-15 DIAGNOSIS — I110 Hypertensive heart disease with heart failure: Secondary | ICD-10-CM | POA: Diagnosis not present

## 2022-11-15 DIAGNOSIS — I951 Orthostatic hypotension: Secondary | ICD-10-CM | POA: Diagnosis not present

## 2022-11-15 DIAGNOSIS — G8194 Hemiplegia, unspecified affecting left nondominant side: Secondary | ICD-10-CM | POA: Diagnosis not present

## 2022-11-15 DIAGNOSIS — E119 Type 2 diabetes mellitus without complications: Secondary | ICD-10-CM | POA: Diagnosis not present

## 2022-11-16 DIAGNOSIS — E119 Type 2 diabetes mellitus without complications: Secondary | ICD-10-CM | POA: Diagnosis not present

## 2022-11-16 DIAGNOSIS — G20A1 Parkinson's disease without dyskinesia, without mention of fluctuations: Secondary | ICD-10-CM | POA: Diagnosis not present

## 2022-11-16 DIAGNOSIS — G8194 Hemiplegia, unspecified affecting left nondominant side: Secondary | ICD-10-CM | POA: Diagnosis not present

## 2022-11-16 DIAGNOSIS — R630 Anorexia: Secondary | ICD-10-CM | POA: Diagnosis not present

## 2022-11-16 DIAGNOSIS — I110 Hypertensive heart disease with heart failure: Secondary | ICD-10-CM | POA: Diagnosis not present

## 2022-11-16 DIAGNOSIS — I951 Orthostatic hypotension: Secondary | ICD-10-CM | POA: Diagnosis not present

## 2022-11-18 DIAGNOSIS — I110 Hypertensive heart disease with heart failure: Secondary | ICD-10-CM | POA: Diagnosis not present

## 2022-11-18 DIAGNOSIS — R630 Anorexia: Secondary | ICD-10-CM | POA: Diagnosis not present

## 2022-11-18 DIAGNOSIS — G8194 Hemiplegia, unspecified affecting left nondominant side: Secondary | ICD-10-CM | POA: Diagnosis not present

## 2022-11-18 DIAGNOSIS — I951 Orthostatic hypotension: Secondary | ICD-10-CM | POA: Diagnosis not present

## 2022-11-18 DIAGNOSIS — G20A1 Parkinson's disease without dyskinesia, without mention of fluctuations: Secondary | ICD-10-CM | POA: Diagnosis not present

## 2022-11-18 DIAGNOSIS — E119 Type 2 diabetes mellitus without complications: Secondary | ICD-10-CM | POA: Diagnosis not present

## 2022-11-21 DIAGNOSIS — E119 Type 2 diabetes mellitus without complications: Secondary | ICD-10-CM | POA: Diagnosis not present

## 2022-11-21 DIAGNOSIS — I951 Orthostatic hypotension: Secondary | ICD-10-CM | POA: Diagnosis not present

## 2022-11-21 DIAGNOSIS — R630 Anorexia: Secondary | ICD-10-CM | POA: Diagnosis not present

## 2022-11-21 DIAGNOSIS — G8194 Hemiplegia, unspecified affecting left nondominant side: Secondary | ICD-10-CM | POA: Diagnosis not present

## 2022-11-21 DIAGNOSIS — G20A1 Parkinson's disease without dyskinesia, without mention of fluctuations: Secondary | ICD-10-CM | POA: Diagnosis not present

## 2022-11-21 DIAGNOSIS — I110 Hypertensive heart disease with heart failure: Secondary | ICD-10-CM | POA: Diagnosis not present

## 2022-11-22 DIAGNOSIS — G20A1 Parkinson's disease without dyskinesia, without mention of fluctuations: Secondary | ICD-10-CM | POA: Diagnosis not present

## 2022-11-22 DIAGNOSIS — R630 Anorexia: Secondary | ICD-10-CM | POA: Diagnosis not present

## 2022-11-22 DIAGNOSIS — G8194 Hemiplegia, unspecified affecting left nondominant side: Secondary | ICD-10-CM | POA: Diagnosis not present

## 2022-11-22 DIAGNOSIS — E119 Type 2 diabetes mellitus without complications: Secondary | ICD-10-CM | POA: Diagnosis not present

## 2022-11-22 DIAGNOSIS — I951 Orthostatic hypotension: Secondary | ICD-10-CM | POA: Diagnosis not present

## 2022-11-22 DIAGNOSIS — I110 Hypertensive heart disease with heart failure: Secondary | ICD-10-CM | POA: Diagnosis not present

## 2022-11-23 DIAGNOSIS — I110 Hypertensive heart disease with heart failure: Secondary | ICD-10-CM | POA: Diagnosis not present

## 2022-11-23 DIAGNOSIS — G20A1 Parkinson's disease without dyskinesia, without mention of fluctuations: Secondary | ICD-10-CM | POA: Diagnosis not present

## 2022-11-23 DIAGNOSIS — I951 Orthostatic hypotension: Secondary | ICD-10-CM | POA: Diagnosis not present

## 2022-11-23 DIAGNOSIS — G8194 Hemiplegia, unspecified affecting left nondominant side: Secondary | ICD-10-CM | POA: Diagnosis not present

## 2022-11-23 DIAGNOSIS — R630 Anorexia: Secondary | ICD-10-CM | POA: Diagnosis not present

## 2022-11-23 DIAGNOSIS — E119 Type 2 diabetes mellitus without complications: Secondary | ICD-10-CM | POA: Diagnosis not present

## 2022-11-24 ENCOUNTER — Ambulatory Visit (INDEPENDENT_AMBULATORY_CARE_PROVIDER_SITE_OTHER): Payer: Medicare Other

## 2022-11-24 DIAGNOSIS — I428 Other cardiomyopathies: Secondary | ICD-10-CM

## 2022-11-24 LAB — CUP PACEART REMOTE DEVICE CHECK
Battery Remaining Longevity: 13 mo
Battery Voltage: 2.88 V
Brady Statistic AP VP Percent: 0 %
Brady Statistic AP VS Percent: 0 %
Brady Statistic AS VP Percent: 0.03 %
Brady Statistic AS VS Percent: 99.96 %
Brady Statistic RA Percent Paced: 0 %
Brady Statistic RV Percent Paced: 0.04 %
Date Time Interrogation Session: 20240425033423
HighPow Impedance: 74 Ohm
Implantable Lead Connection Status: 753985
Implantable Lead Connection Status: 753985
Implantable Lead Implant Date: 20150427
Implantable Lead Implant Date: 20150427
Implantable Lead Location: 753859
Implantable Lead Location: 753860
Implantable Lead Model: 5076
Implantable Lead Model: 6935
Implantable Pulse Generator Implant Date: 20150427
Lead Channel Impedance Value: 342 Ohm
Lead Channel Impedance Value: 399 Ohm
Lead Channel Impedance Value: 4047 Ohm
Lead Channel Impedance Value: 4047 Ohm
Lead Channel Impedance Value: 4047 Ohm
Lead Channel Impedance Value: 418 Ohm
Lead Channel Pacing Threshold Amplitude: 0.625 V
Lead Channel Pacing Threshold Amplitude: 0.875 V
Lead Channel Pacing Threshold Pulse Width: 0.4 ms
Lead Channel Pacing Threshold Pulse Width: 0.4 ms
Lead Channel Sensing Intrinsic Amplitude: 11.75 mV
Lead Channel Sensing Intrinsic Amplitude: 11.75 mV
Lead Channel Sensing Intrinsic Amplitude: 2.125 mV
Lead Channel Sensing Intrinsic Amplitude: 2.125 mV
Lead Channel Setting Pacing Amplitude: 2 V
Lead Channel Setting Pacing Amplitude: 2.5 V
Lead Channel Setting Pacing Pulse Width: 0.4 ms
Lead Channel Setting Sensing Sensitivity: 0.3 mV
Zone Setting Status: 755011
Zone Setting Status: 755011

## 2022-11-25 DIAGNOSIS — I951 Orthostatic hypotension: Secondary | ICD-10-CM | POA: Diagnosis not present

## 2022-11-25 DIAGNOSIS — G8194 Hemiplegia, unspecified affecting left nondominant side: Secondary | ICD-10-CM | POA: Diagnosis not present

## 2022-11-25 DIAGNOSIS — G20A1 Parkinson's disease without dyskinesia, without mention of fluctuations: Secondary | ICD-10-CM | POA: Diagnosis not present

## 2022-11-25 DIAGNOSIS — I110 Hypertensive heart disease with heart failure: Secondary | ICD-10-CM | POA: Diagnosis not present

## 2022-11-25 DIAGNOSIS — R630 Anorexia: Secondary | ICD-10-CM | POA: Diagnosis not present

## 2022-11-25 DIAGNOSIS — E119 Type 2 diabetes mellitus without complications: Secondary | ICD-10-CM | POA: Diagnosis not present

## 2022-11-28 DIAGNOSIS — G8194 Hemiplegia, unspecified affecting left nondominant side: Secondary | ICD-10-CM | POA: Diagnosis not present

## 2022-11-28 DIAGNOSIS — E119 Type 2 diabetes mellitus without complications: Secondary | ICD-10-CM | POA: Diagnosis not present

## 2022-11-28 DIAGNOSIS — I110 Hypertensive heart disease with heart failure: Secondary | ICD-10-CM | POA: Diagnosis not present

## 2022-11-28 DIAGNOSIS — G20A1 Parkinson's disease without dyskinesia, without mention of fluctuations: Secondary | ICD-10-CM | POA: Diagnosis not present

## 2022-11-28 DIAGNOSIS — R630 Anorexia: Secondary | ICD-10-CM | POA: Diagnosis not present

## 2022-11-28 DIAGNOSIS — I951 Orthostatic hypotension: Secondary | ICD-10-CM | POA: Diagnosis not present

## 2022-11-29 DIAGNOSIS — G8194 Hemiplegia, unspecified affecting left nondominant side: Secondary | ICD-10-CM | POA: Diagnosis not present

## 2022-11-29 DIAGNOSIS — R630 Anorexia: Secondary | ICD-10-CM | POA: Diagnosis not present

## 2022-11-29 DIAGNOSIS — G20A1 Parkinson's disease without dyskinesia, without mention of fluctuations: Secondary | ICD-10-CM | POA: Diagnosis not present

## 2022-11-29 DIAGNOSIS — E119 Type 2 diabetes mellitus without complications: Secondary | ICD-10-CM | POA: Diagnosis not present

## 2022-11-29 DIAGNOSIS — I951 Orthostatic hypotension: Secondary | ICD-10-CM | POA: Diagnosis not present

## 2022-11-29 DIAGNOSIS — I110 Hypertensive heart disease with heart failure: Secondary | ICD-10-CM | POA: Diagnosis not present

## 2022-11-30 DIAGNOSIS — N952 Postmenopausal atrophic vaginitis: Secondary | ICD-10-CM | POA: Diagnosis not present

## 2022-11-30 DIAGNOSIS — E785 Hyperlipidemia, unspecified: Secondary | ICD-10-CM | POA: Diagnosis not present

## 2022-11-30 DIAGNOSIS — G20A1 Parkinson's disease without dyskinesia, without mention of fluctuations: Secondary | ICD-10-CM | POA: Diagnosis not present

## 2022-11-30 DIAGNOSIS — I951 Orthostatic hypotension: Secondary | ICD-10-CM | POA: Diagnosis not present

## 2022-11-30 DIAGNOSIS — R630 Anorexia: Secondary | ICD-10-CM | POA: Diagnosis not present

## 2022-11-30 DIAGNOSIS — R000 Tachycardia, unspecified: Secondary | ICD-10-CM | POA: Diagnosis not present

## 2022-11-30 DIAGNOSIS — E119 Type 2 diabetes mellitus without complications: Secondary | ICD-10-CM | POA: Diagnosis not present

## 2022-11-30 DIAGNOSIS — I428 Other cardiomyopathies: Secondary | ICD-10-CM | POA: Diagnosis not present

## 2022-11-30 DIAGNOSIS — K219 Gastro-esophageal reflux disease without esophagitis: Secondary | ICD-10-CM | POA: Diagnosis not present

## 2022-11-30 DIAGNOSIS — J302 Other seasonal allergic rhinitis: Secondary | ICD-10-CM | POA: Diagnosis not present

## 2022-11-30 DIAGNOSIS — G8194 Hemiplegia, unspecified affecting left nondominant side: Secondary | ICD-10-CM | POA: Diagnosis not present

## 2022-11-30 DIAGNOSIS — M479 Spondylosis, unspecified: Secondary | ICD-10-CM | POA: Diagnosis not present

## 2022-11-30 DIAGNOSIS — I110 Hypertensive heart disease with heart failure: Secondary | ICD-10-CM | POA: Diagnosis not present

## 2022-11-30 DIAGNOSIS — I509 Heart failure, unspecified: Secondary | ICD-10-CM | POA: Diagnosis not present

## 2022-12-01 ENCOUNTER — Ambulatory Visit: Payer: Medicare Other | Admitting: Internal Medicine

## 2022-12-02 DIAGNOSIS — E119 Type 2 diabetes mellitus without complications: Secondary | ICD-10-CM | POA: Diagnosis not present

## 2022-12-02 DIAGNOSIS — G8194 Hemiplegia, unspecified affecting left nondominant side: Secondary | ICD-10-CM | POA: Diagnosis not present

## 2022-12-02 DIAGNOSIS — G20A1 Parkinson's disease without dyskinesia, without mention of fluctuations: Secondary | ICD-10-CM | POA: Diagnosis not present

## 2022-12-02 DIAGNOSIS — I110 Hypertensive heart disease with heart failure: Secondary | ICD-10-CM | POA: Diagnosis not present

## 2022-12-02 DIAGNOSIS — I951 Orthostatic hypotension: Secondary | ICD-10-CM | POA: Diagnosis not present

## 2022-12-02 DIAGNOSIS — R630 Anorexia: Secondary | ICD-10-CM | POA: Diagnosis not present

## 2022-12-05 DIAGNOSIS — I951 Orthostatic hypotension: Secondary | ICD-10-CM | POA: Diagnosis not present

## 2022-12-05 DIAGNOSIS — G8194 Hemiplegia, unspecified affecting left nondominant side: Secondary | ICD-10-CM | POA: Diagnosis not present

## 2022-12-05 DIAGNOSIS — R630 Anorexia: Secondary | ICD-10-CM | POA: Diagnosis not present

## 2022-12-05 DIAGNOSIS — E119 Type 2 diabetes mellitus without complications: Secondary | ICD-10-CM | POA: Diagnosis not present

## 2022-12-05 DIAGNOSIS — I110 Hypertensive heart disease with heart failure: Secondary | ICD-10-CM | POA: Diagnosis not present

## 2022-12-05 DIAGNOSIS — G20A1 Parkinson's disease without dyskinesia, without mention of fluctuations: Secondary | ICD-10-CM | POA: Diagnosis not present

## 2022-12-06 DIAGNOSIS — I110 Hypertensive heart disease with heart failure: Secondary | ICD-10-CM | POA: Diagnosis not present

## 2022-12-06 DIAGNOSIS — G20A1 Parkinson's disease without dyskinesia, without mention of fluctuations: Secondary | ICD-10-CM | POA: Diagnosis not present

## 2022-12-06 DIAGNOSIS — R630 Anorexia: Secondary | ICD-10-CM | POA: Diagnosis not present

## 2022-12-06 DIAGNOSIS — I951 Orthostatic hypotension: Secondary | ICD-10-CM | POA: Diagnosis not present

## 2022-12-06 DIAGNOSIS — E119 Type 2 diabetes mellitus without complications: Secondary | ICD-10-CM | POA: Diagnosis not present

## 2022-12-06 DIAGNOSIS — G8194 Hemiplegia, unspecified affecting left nondominant side: Secondary | ICD-10-CM | POA: Diagnosis not present

## 2022-12-07 DIAGNOSIS — E119 Type 2 diabetes mellitus without complications: Secondary | ICD-10-CM | POA: Diagnosis not present

## 2022-12-07 DIAGNOSIS — G20A1 Parkinson's disease without dyskinesia, without mention of fluctuations: Secondary | ICD-10-CM | POA: Diagnosis not present

## 2022-12-07 DIAGNOSIS — G8194 Hemiplegia, unspecified affecting left nondominant side: Secondary | ICD-10-CM | POA: Diagnosis not present

## 2022-12-07 DIAGNOSIS — I951 Orthostatic hypotension: Secondary | ICD-10-CM | POA: Diagnosis not present

## 2022-12-07 DIAGNOSIS — I110 Hypertensive heart disease with heart failure: Secondary | ICD-10-CM | POA: Diagnosis not present

## 2022-12-07 DIAGNOSIS — R630 Anorexia: Secondary | ICD-10-CM | POA: Diagnosis not present

## 2022-12-09 DIAGNOSIS — E119 Type 2 diabetes mellitus without complications: Secondary | ICD-10-CM | POA: Diagnosis not present

## 2022-12-09 DIAGNOSIS — G8194 Hemiplegia, unspecified affecting left nondominant side: Secondary | ICD-10-CM | POA: Diagnosis not present

## 2022-12-09 DIAGNOSIS — I110 Hypertensive heart disease with heart failure: Secondary | ICD-10-CM | POA: Diagnosis not present

## 2022-12-09 DIAGNOSIS — R630 Anorexia: Secondary | ICD-10-CM | POA: Diagnosis not present

## 2022-12-09 DIAGNOSIS — G20A1 Parkinson's disease without dyskinesia, without mention of fluctuations: Secondary | ICD-10-CM | POA: Diagnosis not present

## 2022-12-09 DIAGNOSIS — I951 Orthostatic hypotension: Secondary | ICD-10-CM | POA: Diagnosis not present

## 2022-12-10 DIAGNOSIS — I110 Hypertensive heart disease with heart failure: Secondary | ICD-10-CM | POA: Diagnosis not present

## 2022-12-10 DIAGNOSIS — G8194 Hemiplegia, unspecified affecting left nondominant side: Secondary | ICD-10-CM | POA: Diagnosis not present

## 2022-12-10 DIAGNOSIS — R630 Anorexia: Secondary | ICD-10-CM | POA: Diagnosis not present

## 2022-12-10 DIAGNOSIS — I951 Orthostatic hypotension: Secondary | ICD-10-CM | POA: Diagnosis not present

## 2022-12-10 DIAGNOSIS — E119 Type 2 diabetes mellitus without complications: Secondary | ICD-10-CM | POA: Diagnosis not present

## 2022-12-10 DIAGNOSIS — G20A1 Parkinson's disease without dyskinesia, without mention of fluctuations: Secondary | ICD-10-CM | POA: Diagnosis not present

## 2022-12-12 DIAGNOSIS — R630 Anorexia: Secondary | ICD-10-CM | POA: Diagnosis not present

## 2022-12-12 DIAGNOSIS — I110 Hypertensive heart disease with heart failure: Secondary | ICD-10-CM | POA: Diagnosis not present

## 2022-12-12 DIAGNOSIS — G20A1 Parkinson's disease without dyskinesia, without mention of fluctuations: Secondary | ICD-10-CM | POA: Diagnosis not present

## 2022-12-12 DIAGNOSIS — I951 Orthostatic hypotension: Secondary | ICD-10-CM | POA: Diagnosis not present

## 2022-12-12 DIAGNOSIS — G8194 Hemiplegia, unspecified affecting left nondominant side: Secondary | ICD-10-CM | POA: Diagnosis not present

## 2022-12-12 DIAGNOSIS — E119 Type 2 diabetes mellitus without complications: Secondary | ICD-10-CM | POA: Diagnosis not present

## 2022-12-14 DIAGNOSIS — R630 Anorexia: Secondary | ICD-10-CM | POA: Diagnosis not present

## 2022-12-14 DIAGNOSIS — I951 Orthostatic hypotension: Secondary | ICD-10-CM | POA: Diagnosis not present

## 2022-12-14 DIAGNOSIS — G20A1 Parkinson's disease without dyskinesia, without mention of fluctuations: Secondary | ICD-10-CM | POA: Diagnosis not present

## 2022-12-14 DIAGNOSIS — G8194 Hemiplegia, unspecified affecting left nondominant side: Secondary | ICD-10-CM | POA: Diagnosis not present

## 2022-12-14 DIAGNOSIS — E119 Type 2 diabetes mellitus without complications: Secondary | ICD-10-CM | POA: Diagnosis not present

## 2022-12-14 DIAGNOSIS — I110 Hypertensive heart disease with heart failure: Secondary | ICD-10-CM | POA: Diagnosis not present

## 2022-12-16 DIAGNOSIS — G20A1 Parkinson's disease without dyskinesia, without mention of fluctuations: Secondary | ICD-10-CM | POA: Diagnosis not present

## 2022-12-16 DIAGNOSIS — I110 Hypertensive heart disease with heart failure: Secondary | ICD-10-CM | POA: Diagnosis not present

## 2022-12-16 DIAGNOSIS — R630 Anorexia: Secondary | ICD-10-CM | POA: Diagnosis not present

## 2022-12-16 DIAGNOSIS — E119 Type 2 diabetes mellitus without complications: Secondary | ICD-10-CM | POA: Diagnosis not present

## 2022-12-16 DIAGNOSIS — I951 Orthostatic hypotension: Secondary | ICD-10-CM | POA: Diagnosis not present

## 2022-12-16 DIAGNOSIS — G8194 Hemiplegia, unspecified affecting left nondominant side: Secondary | ICD-10-CM | POA: Diagnosis not present

## 2022-12-19 ENCOUNTER — Encounter: Payer: Self-pay | Admitting: Cardiovascular Disease

## 2022-12-19 ENCOUNTER — Ambulatory Visit: Payer: Medicare Other | Attending: Cardiovascular Disease | Admitting: Cardiovascular Disease

## 2022-12-19 VITALS — BP 114/68 | HR 110 | Ht 61.0 in | Wt 105.4 lb

## 2022-12-19 DIAGNOSIS — I110 Hypertensive heart disease with heart failure: Secondary | ICD-10-CM | POA: Diagnosis not present

## 2022-12-19 DIAGNOSIS — I951 Orthostatic hypotension: Secondary | ICD-10-CM | POA: Diagnosis not present

## 2022-12-19 DIAGNOSIS — I5022 Chronic systolic (congestive) heart failure: Secondary | ICD-10-CM | POA: Diagnosis not present

## 2022-12-19 DIAGNOSIS — G8194 Hemiplegia, unspecified affecting left nondominant side: Secondary | ICD-10-CM | POA: Diagnosis not present

## 2022-12-19 DIAGNOSIS — R630 Anorexia: Secondary | ICD-10-CM | POA: Diagnosis not present

## 2022-12-19 DIAGNOSIS — E119 Type 2 diabetes mellitus without complications: Secondary | ICD-10-CM | POA: Diagnosis not present

## 2022-12-19 DIAGNOSIS — Z95810 Presence of automatic (implantable) cardiac defibrillator: Secondary | ICD-10-CM

## 2022-12-19 DIAGNOSIS — G20A1 Parkinson's disease without dyskinesia, without mention of fluctuations: Secondary | ICD-10-CM | POA: Diagnosis not present

## 2022-12-19 NOTE — Progress Notes (Signed)
Cardiology Office Note:    Date:  12/19/2022   ID:  Veronica Shaw, DOB 12-12-1950, MRN [id number]  PCP:  Levin Erp, MD   [facility] Providers Cardiologist:  [name], MD Cardiology APP:  Beatrice Lecher, PA-C  Electrophysiologist:  Lewayne Bunting, MD     Referring MD: Levin Erp, MD   Chief Complaint  Patient presents with   Congestive Heart Failure    History of Present Illness:    Veronica Shaw is a 72 y.o. female presenting for follow-up evaluation.  Her cardiovascular problem list is outlined below:  HFmrEF (heart failure with mildly reduced ejection fraction)  Non-ischemic cardiomyopathy Cath 2014: no sig CAD Myoview 12/29/16: EF 49, no ischemia  CMR 4/15: EF 34 Echocardiogram 5/17: EF 20-25 Echocardiogram 6/18: EF 30-35 Echo 10/18/17: EF 60-65, normal wall motion, grade 2 diastolic dysfunction  Echo 10/18/20: EF 40-45, global HK, normal RVSF, RVSP 22  S/p AICD Diabetes mellitus Endo: Dr. Lonzo Cloud Hyperlipidemia Chronic kidney disease  OSA Asthma Parkinson's Disease Neuro: Dr. Arbutus Leas Syncope 09/2017 Cervical spine DDD  The patient is here with her granddaughter today.  The patient is in a wheelchair.  She has really advanced Parkinson's disease now.  She is followed by hospice.  We had a lot of end-of-life discussion today.  It sounds like she has rescinded her DNR, but still favors a palliative approach to her care.  We discussed her defibrillator and issues around turning the device therapy off when she becomes full DNR.  The patient has not had any chest pain or shortness of breath.  She had recurrent syncope on Mother's Day.  This occurs while she is sitting in her wheelchair.  She has not had a major fall or injury.  Past Medical History:  Diagnosis Date   Acute on chronic systolic CHF (congestive heart failure), NYHA class 4 (HCC) 10/19/2020   Asthma    Back pain 09/18/2019   Cervical disc disorder with radiculopathy of  cervical region 09/24/2018   Chronic systolic CHF (congestive heart failure) (HCC)    a. cMRI 4/15: EF 34% and findings - c/w NICM, normal RV size and function (RVEF 61%), Mild MR // b. Echo 2/15:  EF 30-35%, diff HK, ant-sept AK, Gr 2 DD, mild MR, trivial TR  //  c. Echo 5/17: EF 20-25%, severe diffuse HK, marked systolic dyssynchrony, grade 1 diastolic dysfunction, mild MR  //  d. RHC 5/17: Fick CO 2.9, RVSP 19, PASP 15, PW mean 2, low filing pressures and preserved CO    Cognitive impairment 10/19/2017   MOCA was administered with a score of 23/30   Diabetes mellitus    Diabetic peripheral neuropathy (HCC) 12/09/2011   Dyspnea 09/19/2012   Flu 10/17/2017   Gastritis    History of echocardiogram    Echo 6/18: EF 30-35, diffuse HK, grade 1 diastolic dysfunction, trivial MR, mild LAE, mild TR, no pericardial effusion   History of nuclear stress test    Myoview 5/18: EF 49, no ischemia, inferoseptal defect c/w LBBB artifact (intermediate risk due to EF < 50).   HTN (hypertension)    Hyperlipidemia    Hyperlipidemia associated with type 2 diabetes mellitus (HCC) 03/05/2019   Hypertension associated with diabetes (HCC) 09/28/2006   Qualifier: Diagnosis of  By: Abundio Miu     Major depression 03/31/2013   Melena 08/21/2020   NICM (nonischemic cardiomyopathy) (HCC)    a. Nuclear 5/13: Normal stress nuclear study. LV Ejection Fraction: 58%  //  b. LHC 10/14: Minor luminal irregularity in prox LAD, EF 35%    Parkinson disease    Plantar fasciitis    Plantar fasciitis, bilateral 06/15/2012   Rosacea 05/29/2009   Qualifier: Diagnosis of  By: Constance Goltz MD, Marcie Mowers hearing loss (SNHL), bilateral 01/04/2017   Sleep apnea    was retested and no longer had it and so d/c CPAP   Syncope    Thoracic or lumbosacral neuritis or radiculitis, unspecified 07/03/2013   Tinnitus, bilateral 01/04/2017   Type II diabetes mellitus with complication (HCC) 09/28/2006   Diabetic eye exam done by [facility]  Ophthalmology: Dr. Randon Goldsmith on 11/08/13: no diabetic retinopathy. Repeat in 1 year     Urticaria     Past Surgical History:  Procedure Laterality Date   BREAST EXCISIONAL BIOPSY Left 1970   benign cyst   BREAST SURGERY Left    BUNIONECTOMY     CARDIAC CATHETERIZATION N/A 12/16/2015   Procedure: Right Heart Cath;  Surgeon: [name], MD;  Location: [facility] INVASIVE CV LAB;  Service: Cardiovascular;  Laterality: N/A;   CHOLECYSTECTOMY     eye lid surgery Bilateral 05/09/2019   IMPLANTABLE CARDIOVERTER DEFIBRILLATOR IMPLANT  11-25-13   MDT dual chamber ICD implanted by Dr Ladona Ridgel for primary prevention   IMPLANTABLE CARDIOVERTER DEFIBRILLATOR IMPLANT N/A 11/25/2013   Procedure: IMPLANTABLE CARDIOVERTER DEFIBRILLATOR IMPLANT;  Surgeon: Marinus Maw, MD;  Location[facility] CATH LAB;  Service: Cardiovascular;  Laterality: N/A;   LEFT AND RIGHT HEART CATHETERIZATION WITH CORONARY ANGIOGRAM N/A 05/31/2013   Procedure: LEFT AND RIGHT HEART CATHETERIZATION WITH CORONARY ANGIOGRAM;  Surgeon: Micheline Chapman, MD;  Location: [facility] CATH LAB;  Service: Cardiovascular;  Laterality: N/A;   TONSILLECTOMY     TUBAL LIGATION      Current Medications: Current Meds  Medication Sig   acetaminophen (TYLENOL) 500 MG tablet Take 2 tablets (1,000 mg total) by mouth every 8 (eight) hours as needed.   albuterol (PROVENTIL) (2.5 MG/3ML) 0.083% nebulizer solution Inhale into the lungs.   albuterol (VENTOLIN HFA) 108 (90 Base) MCG/ACT inhaler Inhale into the lungs.   carbidopa-levodopa (SINEMET IR) 25-100 MG tablet 2 tablets at 7am, 2 at 11am, 1 tablet at 4pm   diclofenac Sodium (VOLTAREN) 1 % GEL APPLY 4 GRAMS TOPICALLY FOUR TIMES DAILY   famotidine (PEPCID) 20 MG tablet TAKE 1 TABLET TWICE DAILY (Patient taking differently: Take 20 mg by mouth 2 (two) times daily.)   furosemide (LASIX) 20 MG tablet Take 1 tablet (20 mg total) by mouth daily as needed (take for fluid accumulation in extremities, or weight gain of 3 pounds or  more overnight).   gabapentin (NEURONTIN) 300 MG capsule Take 300 mg by mouth 3 (three) times daily.   melatonin 5 MG TABS Take 5 mg by mouth at bedtime.   metFORMIN (GLUCOPHAGE-XR) 500 MG 24 hr tablet Take 500 mg by mouth daily.   montelukast (SINGULAIR) 10 MG tablet Take 1 tablet (10 mg total) by mouth at bedtime.   OXYGEN Inhale into the lungs at bedtime. Inhale 2L into lungs   polyethylene glycol (MIRALAX / GLYCOLAX) 17 g packet Take 17 g by mouth daily.   rosuvastatin (CRESTOR) 10 MG tablet Take 1 tablet (10 mg total) by mouth as directed. Three times a week   sulfamethoxazole-trimethoprim (BACTRIM DS) 800-160 MG tablet Take 1 tablet by mouth daily.   tizanidine (ZANAFLEX) 2 MG capsule Take 4 mg by mouth every 8 (eight) hours as needed.  Allergies:   Aspirin, Mirtazapine, Penicillins, Prempro [conj estrog-medroxyprogest ace], Ace inhibitors, and Simvastatin   Social History   Socioeconomic History   Marital status: Married    Spouse name: Molly Maduro    Number of children: 2   Years of education: 12   Highest education level: 12th grade  Occupational History   Occupation: retired    Comment: CNA  Tobacco Use   Smoking status: Never    Passive exposure: Past   Smokeless tobacco: Never   Tobacco comments:    exposed to smoke during child hood (parents)  Vaping Use   Vaping Use: Never used  Substance and Sexual Activity   Alcohol use: No    Alcohol/week: 0.0 standard drinks of alcohol   Drug use: No   Sexual activity: Not on file  Other Topics Concern   Not on file  Social History Narrative   Patient lives with her husband Molly Maduro.   Patient enjoys spending time with her family, cooking, and her 2 dogs.    Patient has one daughter who lives locally, and one son who lives in [place].   Social Determinants of Health   Financial Resource Strain: Low Risk  (07/01/2020)   Overall Financial Resource Strain (CARDIA)    Difficulty of Paying Living Expenses: Not hard at all   Food Insecurity: No Food Insecurity (07/01/2020)   Hunger Vital Sign    Worried About Running Out of Food in the Last Year: Never true    Ran Out of Food in the Last Year: Never true  Transportation Needs: No Transportation Needs (07/01/2020)   PRAPARE - Administrator, Civil Service (Medical): No    Lack of Transportation (Non-Medical): No  Physical Activity: Sufficiently Active (07/01/2020)   Exercise Vital Sign    Days of Exercise per Week: 7 days    Minutes of Exercise per Session: 30 min  Stress: No Stress Concern Present (07/01/2020)   [employer] of Occupational Health - Occupational Stress Questionnaire    Feeling of Stress : Not at all  Social Connections: Moderately Integrated (07/01/2020)   Social Connection and Isolation Panel [NHANES]    Frequency of Communication with Friends and Family: More than three times a week    Frequency of Social Gatherings with Friends and Family: More than three times a week    Attends Religious Services: More than 4 times per year    Active Member of [employer] or Organizations: No    Attends Engineer, structural: Never    Marital Status: Married     Family History: The patient's family history includes Coronary artery disease in her father and mother; Diabetes in her father, mother, sister, sister, and sister; Diabetes (age of onset: 25) in her son; Healthy in her daughter; Heart attack in her father and mother; Heart disease in her sister, sister, and sister; Hepatitis C in her sister; Parkinson's disease in her sister. There is no history of Breast cancer.  ROS:   Please see the history of present illness.    All other systems reviewed and are negative.  EKGs/Labs/Other Studies Reviewed:    The following studies were reviewed today: Cardiac Studies & Procedures       ECHOCARDIOGRAM  ECHOCARDIOGRAM COMPLETE 09/22/2022  Narrative ECHOCARDIOGRAM REPORT    Patient Name:   VADIS PERFECT Date of Exam:  09/22/2022 Medical Rec #:  [id number]          Height:       61.0 in Accession #:  [id number]         Weight:       110.0 lb Date of Birth:  24-Oct-1950           BSA:          1.465 m Patient Age:    71 years           BP:           127/80 mmHg Patient Gender: F                  HR:           127 bpm. Exam Location:  Inpatient  Procedure: 2D Echo, Cardiac Doppler and Color Doppler  Indications:    R55 Syncope  History:        Patient has prior history of Echocardiogram examinations, most recent 10/18/2020. Pacemaker; Risk Factors:Diabetes, Hypertension and Dyslipidemia.  Sonographer:    Dondra Prader RVT RCS Referring Phys: [id number] SARA L NEAL  IMPRESSIONS   1. Global hypokinesis with abnormal septal motion consider re assessing EF when HR slowr Study done with HR of 126 bpm. Left ventricular ejection fraction, by estimation, is 45 to 50%. The left ventricle has mildly decreased function. The left ventricle demonstrates global hypokinesis. The left ventricular internal cavity size was mildly dilated. Left ventricular diastolic parameters are indeterminate. 2. Pacing wires in RA/RV . Right ventricular systolic function is normal. The right ventricular size is normal. 3. The mitral valve is abnormal. Trivial mitral valve regurgitation. No evidence of mitral stenosis. 4. The aortic valve is tricuspid. There is mild calcification of the aortic valve. There is mild thickening of the aortic valve. Aortic valve regurgitation is not visualized. Aortic valve sclerosis/calcification is present, without any evidence of aortic stenosis. 5. The inferior vena cava is normal in size with greater than 50% respiratory variability, suggesting right atrial pressure of 3 mmHg.  Comparison(s): EF 40-45%.  FINDINGS Left Ventricle: Global hypokinesis with abnormal septal motion consider re assessing EF when HR slowr Study done with HR of 126 bpm. Left ventricular ejection fraction, by estimation, is 45 to 50%.  The left ventricle has mildly decreased function. The left ventricle demonstrates global hypokinesis. The left ventricular internal cavity size was mildly dilated. There is no left ventricular hypertrophy. Left ventricular diastolic parameters are indeterminate.  Right Ventricle: Pacing wires in RA/RV. The right ventricular size is normal. No increase in right ventricular wall thickness. Right ventricular systolic function is normal.  Left Atrium: Left atrial size was normal in size.  Right Atrium: Right atrial size was normal in size.  Pericardium: Trivial pericardial effusion is present. The pericardial effusion is posterior to the left ventricle.  Mitral Valve: The mitral valve is abnormal. There is mild thickening of the mitral valve leaflet(s). There is mild calcification of the mitral valve leaflet(s). Trivial mitral valve regurgitation. No evidence of mitral valve stenosis.  Tricuspid Valve: The tricuspid valve is normal in structure. Tricuspid valve regurgitation is not demonstrated. No evidence of tricuspid stenosis.  Aortic Valve: The aortic valve is tricuspid. There is mild calcification of the aortic valve. There is mild thickening of the aortic valve. Aortic valve regurgitation is not visualized. Aortic valve sclerosis/calcification is present, without any evidence of aortic stenosis. Aortic valve mean gradient measures 2.0 mmHg. Aortic valve peak gradient measures 3.7 mmHg. Aortic valve area, by VTI measures 3.06 cm.  Pulmonic Valve: The pulmonic valve was normal in structure. Pulmonic valve regurgitation is not visualized. No evidence of pulmonic  stenosis.  Aorta: The aortic root is normal in size and structure.  Venous: The inferior vena cava is normal in size with greater than 50% respiratory variability, suggesting right atrial pressure of 3 mmHg.  IAS/Shunts: No atrial level shunt detected by color flow Doppler.   LEFT VENTRICLE PLAX 2D LVIDd:         3.90 cm    Diastology LVIDs:         3.00 cm   LV e' medial:   4.90 cm/s LV PW:         0.80 cm   LV E/e' medial: 24.9 LV IVS:        0.90 cm LVOT diam:     2.20 cm LV SV:         38 LV SV Index:   26 LVOT Area:     3.80 cm   RIGHT VENTRICLE RV S prime:     17.40 cm/s TAPSE (M-mode): 1.5 cm  LEFT ATRIUM         Index LA diam:    2.80 cm 1.91 cm/m AORTIC VALVE                    PULMONIC VALVE AV Area (Vmax):    3.22 cm     PV Vmax:       1.02 m/s AV Area (Vmean):   3.05 cm     PV Peak grad:  4.2 mmHg AV Area (VTI):     3.06 cm AV Vmax:           95.60 cm/s AV Vmean:          66.500 cm/s AV VTI:            0.124 m AV Peak Grad:      3.7 mmHg AV Mean Grad:      2.0 mmHg LVOT Vmax:         81.00 cm/s LVOT Vmean:        53.400 cm/s LVOT VTI:          0.100 m LVOT/AV VTI ratio: 0.80  AORTA Ao Root diam: 2.80 cm  MITRAL VALVE MV Area (PHT): 6.54 cm     SHUNTS MV Decel Time: 116 msec     Systemic VTI:  0.10 m MV E velocity: 122.00 cm/s  Systemic Diam: 2.20 cm  Charlton Haws MD Electronically signed by Charlton Haws MD Signature Date/Time: 09/22/2022/6:10:10 PM    Final              EKG:  EKG is not ordered today.    Recent Labs: 01/06/2022: TSH 1.99 08/15/2022: ALT <5 09/14/2022: NT-Pro BNP 886 09/22/2022: BUN 13; Creatinine, Ser 0.94; Hemoglobin 12.0; Magnesium 2.0; Platelets 172; Potassium 3.5; Sodium 141  Recent Lipid Panel    Component Value Date/Time   CHOL 171 11/15/2021 0915   TRIG 120 11/15/2021 0915   HDL 53 11/15/2021 0915   CHOLHDL 3.2 11/15/2021 0915   CHOLHDL 3.5 05/31/2013 0435   VLDL 28 05/31/2013 0435   LDLCALC 97 11/15/2021 0915   LDLDIRECT 111 (H) 02/10/2015 1145     Risk Assessment/Calculations:                Physical Exam:    VS:  BP 114/68   Pulse (!) 110   Ht 5\' 1"  (1.549 m)   Wt 105 lb 6.4 oz (47.8 kg)   LMP 08/01/2000 (Approximate)   SpO2 95%   BMI 19.92 kg/m  Wt Readings from Last 3 Encounters:  12/19/22 105 lb 6.4 oz  (47.8 kg)  09/21/22 110 lb (49.9 kg)  09/14/22 111 lb 8 oz (50.6 kg)     GEN:  Well nourished, well developed, elderly thin woman, in no acute distress HEENT: Normal NECK: No JVD; No carotid bruits LYMPHATICS: No lymphadenopathy CARDIAC: RRR, no murmurs, rubs, gallops RESPIRATORY:  Clear to auscultation without rales, wheezing or rhonchi  ABDOMEN: Soft, non-tender, non-distended MUSCULOSKELETAL:  No edema; No deformity  SKIN: Warm and dry NEUROLOGIC:  Alert and oriented x 3 PSYCHIATRIC:  Normal affect   ASSESSMENT:    1. Chronic systolic heart failure (HCC)   2. ICD (implantable cardioverter-defibrillator), dual, in situ    PLAN:    In order of problems listed above:  Most recent echo from February reviewed with LVEF 45 to 50% with some dyssynchrony noted.  The patient does not have active signs of heart failure.  All of her medication had to be discontinued because of hypotension.  The patient has lost a lot of weight.  Physically, she has generally declined.  I completely agree that she is appropriate for hospice and a palliative approach.  She is able to use furosemide as needed, but has not had any recent issues with edema.  Current management will otherwise be continued.  She can discontinue rosuvastatin. We discussed consideration of turning off her ICD device therapy.  She will talk this over with her family.  She has scheduled follow-up with Dr. Ladona Ridgel this summer.           Medication Adjustments/Labs and Tests Ordered: Current medicines are reviewed at length with the patient today.  Concerns regarding medicines are outlined above.  No orders of the defined types were placed in this encounter.  No orders of the defined types were placed in this encounter.   Patient Instructions  Medication Instructions:  Your physician recommends that you continue on your current medications as directed. Please refer to the Current Medication list given to you today.  *If you  need a refill on your cardiac medications before your next appointment, please call your pharmacy*  Lab Work: None ordered today.  Testing/Procedures: None ordered today.  Follow-Up: At [facility], you and your health needs are our priority.  As part of our continuing mission to provide you with exceptional heart care, we have created designated Provider Care Teams.  These Care Teams include your primary Cardiologist (physician) and Advanced Practice Providers (APPs -  Physician Assistants and Nurse Practitioners) who all work together to provide you with the care you need, when you need it.  Your next appointment:   6 month(s)  The format for your next appointment:   In Person  Provider:   [name], MD  or Jari Favre, PA-C, Robin Searing, NP, Eligha Bridegroom, NP, or Tereso Newcomer, PA-C   Signed, [name], MD  12/19/2022 5:34 PM    [facility]

## 2022-12-19 NOTE — Patient Instructions (Signed)
Medication Instructions:  Your physician recommends that you continue on your current medications as directed. Please refer to the Current Medication list given to you today.  *If you need a refill on your cardiac medications before your next appointment, please call your pharmacy*  Lab Work: None ordered today.  Testing/Procedures: None ordered today.  Follow-Up: At [facility], you and your health needs are our priority.  As part of our continuing mission to provide you with exceptional heart care, we have created designated Provider Care Teams.  These Care Teams include your primary Cardiologist (physician) and Advanced Practice Providers (APPs -  Physician Assistants and Nurse Practitioners) who all work together to provide you with the care you need, when you need it.  Your next appointment:   6 month(s)  The format for your next appointment:   In Person  Provider:   [name], MD  or Jari Favre, PA-C, Robin Searing, NP, Eligha Bridegroom, NP, or Tereso Newcomer, PA-C

## 2022-12-20 ENCOUNTER — Ambulatory Visit: Payer: Medicare Other | Admitting: Neurology

## 2022-12-20 DIAGNOSIS — R630 Anorexia: Secondary | ICD-10-CM | POA: Diagnosis not present

## 2022-12-20 DIAGNOSIS — G20A1 Parkinson's disease without dyskinesia, without mention of fluctuations: Secondary | ICD-10-CM | POA: Diagnosis not present

## 2022-12-20 DIAGNOSIS — I110 Hypertensive heart disease with heart failure: Secondary | ICD-10-CM | POA: Diagnosis not present

## 2022-12-20 DIAGNOSIS — E119 Type 2 diabetes mellitus without complications: Secondary | ICD-10-CM | POA: Diagnosis not present

## 2022-12-20 DIAGNOSIS — I951 Orthostatic hypotension: Secondary | ICD-10-CM | POA: Diagnosis not present

## 2022-12-20 DIAGNOSIS — G8194 Hemiplegia, unspecified affecting left nondominant side: Secondary | ICD-10-CM | POA: Diagnosis not present

## 2022-12-20 NOTE — Progress Notes (Signed)
Remote ICD transmission.   

## 2022-12-21 DIAGNOSIS — I951 Orthostatic hypotension: Secondary | ICD-10-CM | POA: Diagnosis not present

## 2022-12-21 DIAGNOSIS — G8194 Hemiplegia, unspecified affecting left nondominant side: Secondary | ICD-10-CM | POA: Diagnosis not present

## 2022-12-21 DIAGNOSIS — I110 Hypertensive heart disease with heart failure: Secondary | ICD-10-CM | POA: Diagnosis not present

## 2022-12-21 DIAGNOSIS — G20A1 Parkinson's disease without dyskinesia, without mention of fluctuations: Secondary | ICD-10-CM | POA: Diagnosis not present

## 2022-12-21 DIAGNOSIS — R630 Anorexia: Secondary | ICD-10-CM | POA: Diagnosis not present

## 2022-12-21 DIAGNOSIS — E119 Type 2 diabetes mellitus without complications: Secondary | ICD-10-CM | POA: Diagnosis not present

## 2022-12-22 DIAGNOSIS — E119 Type 2 diabetes mellitus without complications: Secondary | ICD-10-CM | POA: Diagnosis not present

## 2022-12-22 DIAGNOSIS — I951 Orthostatic hypotension: Secondary | ICD-10-CM | POA: Diagnosis not present

## 2022-12-22 DIAGNOSIS — G8194 Hemiplegia, unspecified affecting left nondominant side: Secondary | ICD-10-CM | POA: Diagnosis not present

## 2022-12-22 DIAGNOSIS — R630 Anorexia: Secondary | ICD-10-CM | POA: Diagnosis not present

## 2022-12-22 DIAGNOSIS — I110 Hypertensive heart disease with heart failure: Secondary | ICD-10-CM | POA: Diagnosis not present

## 2022-12-22 DIAGNOSIS — G20A1 Parkinson's disease without dyskinesia, without mention of fluctuations: Secondary | ICD-10-CM | POA: Diagnosis not present

## 2022-12-23 DIAGNOSIS — I110 Hypertensive heart disease with heart failure: Secondary | ICD-10-CM | POA: Diagnosis not present

## 2022-12-23 DIAGNOSIS — G20A1 Parkinson's disease without dyskinesia, without mention of fluctuations: Secondary | ICD-10-CM | POA: Diagnosis not present

## 2022-12-23 DIAGNOSIS — I951 Orthostatic hypotension: Secondary | ICD-10-CM | POA: Diagnosis not present

## 2022-12-23 DIAGNOSIS — E119 Type 2 diabetes mellitus without complications: Secondary | ICD-10-CM | POA: Diagnosis not present

## 2022-12-23 DIAGNOSIS — G8194 Hemiplegia, unspecified affecting left nondominant side: Secondary | ICD-10-CM | POA: Diagnosis not present

## 2022-12-23 DIAGNOSIS — R630 Anorexia: Secondary | ICD-10-CM | POA: Diagnosis not present

## 2022-12-24 DIAGNOSIS — I110 Hypertensive heart disease with heart failure: Secondary | ICD-10-CM | POA: Diagnosis not present

## 2022-12-24 DIAGNOSIS — R630 Anorexia: Secondary | ICD-10-CM | POA: Diagnosis not present

## 2022-12-24 DIAGNOSIS — G20A1 Parkinson's disease without dyskinesia, without mention of fluctuations: Secondary | ICD-10-CM | POA: Diagnosis not present

## 2022-12-24 DIAGNOSIS — G8194 Hemiplegia, unspecified affecting left nondominant side: Secondary | ICD-10-CM | POA: Diagnosis not present

## 2022-12-24 DIAGNOSIS — E119 Type 2 diabetes mellitus without complications: Secondary | ICD-10-CM | POA: Diagnosis not present

## 2022-12-24 DIAGNOSIS — I951 Orthostatic hypotension: Secondary | ICD-10-CM | POA: Diagnosis not present

## 2022-12-25 DIAGNOSIS — I110 Hypertensive heart disease with heart failure: Secondary | ICD-10-CM | POA: Diagnosis not present

## 2022-12-25 DIAGNOSIS — R630 Anorexia: Secondary | ICD-10-CM | POA: Diagnosis not present

## 2022-12-25 DIAGNOSIS — I951 Orthostatic hypotension: Secondary | ICD-10-CM | POA: Diagnosis not present

## 2022-12-25 DIAGNOSIS — E119 Type 2 diabetes mellitus without complications: Secondary | ICD-10-CM | POA: Diagnosis not present

## 2022-12-25 DIAGNOSIS — G20A1 Parkinson's disease without dyskinesia, without mention of fluctuations: Secondary | ICD-10-CM | POA: Diagnosis not present

## 2022-12-25 DIAGNOSIS — G8194 Hemiplegia, unspecified affecting left nondominant side: Secondary | ICD-10-CM | POA: Diagnosis not present

## 2022-12-26 DIAGNOSIS — G8194 Hemiplegia, unspecified affecting left nondominant side: Secondary | ICD-10-CM | POA: Diagnosis not present

## 2022-12-26 DIAGNOSIS — G20A1 Parkinson's disease without dyskinesia, without mention of fluctuations: Secondary | ICD-10-CM | POA: Diagnosis not present

## 2022-12-26 DIAGNOSIS — I110 Hypertensive heart disease with heart failure: Secondary | ICD-10-CM | POA: Diagnosis not present

## 2022-12-26 DIAGNOSIS — E119 Type 2 diabetes mellitus without complications: Secondary | ICD-10-CM | POA: Diagnosis not present

## 2022-12-26 DIAGNOSIS — R630 Anorexia: Secondary | ICD-10-CM | POA: Diagnosis not present

## 2022-12-26 DIAGNOSIS — I951 Orthostatic hypotension: Secondary | ICD-10-CM | POA: Diagnosis not present

## 2022-12-27 DIAGNOSIS — E119 Type 2 diabetes mellitus without complications: Secondary | ICD-10-CM | POA: Diagnosis not present

## 2022-12-27 DIAGNOSIS — G20A1 Parkinson's disease without dyskinesia, without mention of fluctuations: Secondary | ICD-10-CM | POA: Diagnosis not present

## 2022-12-27 DIAGNOSIS — I110 Hypertensive heart disease with heart failure: Secondary | ICD-10-CM | POA: Diagnosis not present

## 2022-12-27 DIAGNOSIS — R630 Anorexia: Secondary | ICD-10-CM | POA: Diagnosis not present

## 2022-12-27 DIAGNOSIS — I951 Orthostatic hypotension: Secondary | ICD-10-CM | POA: Diagnosis not present

## 2022-12-27 DIAGNOSIS — G8194 Hemiplegia, unspecified affecting left nondominant side: Secondary | ICD-10-CM | POA: Diagnosis not present

## 2022-12-28 DIAGNOSIS — I951 Orthostatic hypotension: Secondary | ICD-10-CM | POA: Diagnosis not present

## 2022-12-28 DIAGNOSIS — G20A1 Parkinson's disease without dyskinesia, without mention of fluctuations: Secondary | ICD-10-CM | POA: Diagnosis not present

## 2022-12-28 DIAGNOSIS — E119 Type 2 diabetes mellitus without complications: Secondary | ICD-10-CM | POA: Diagnosis not present

## 2022-12-28 DIAGNOSIS — I110 Hypertensive heart disease with heart failure: Secondary | ICD-10-CM | POA: Diagnosis not present

## 2022-12-28 DIAGNOSIS — R630 Anorexia: Secondary | ICD-10-CM | POA: Diagnosis not present

## 2022-12-28 DIAGNOSIS — G8194 Hemiplegia, unspecified affecting left nondominant side: Secondary | ICD-10-CM | POA: Diagnosis not present

## 2022-12-30 DIAGNOSIS — R630 Anorexia: Secondary | ICD-10-CM | POA: Diagnosis not present

## 2022-12-30 DIAGNOSIS — I110 Hypertensive heart disease with heart failure: Secondary | ICD-10-CM | POA: Diagnosis not present

## 2022-12-30 DIAGNOSIS — E119 Type 2 diabetes mellitus without complications: Secondary | ICD-10-CM | POA: Diagnosis not present

## 2022-12-30 DIAGNOSIS — G20A1 Parkinson's disease without dyskinesia, without mention of fluctuations: Secondary | ICD-10-CM | POA: Diagnosis not present

## 2022-12-30 DIAGNOSIS — I951 Orthostatic hypotension: Secondary | ICD-10-CM | POA: Diagnosis not present

## 2022-12-30 DIAGNOSIS — G8194 Hemiplegia, unspecified affecting left nondominant side: Secondary | ICD-10-CM | POA: Diagnosis not present

## 2022-12-31 DIAGNOSIS — I428 Other cardiomyopathies: Secondary | ICD-10-CM | POA: Diagnosis not present

## 2022-12-31 DIAGNOSIS — E785 Hyperlipidemia, unspecified: Secondary | ICD-10-CM | POA: Diagnosis not present

## 2022-12-31 DIAGNOSIS — G8194 Hemiplegia, unspecified affecting left nondominant side: Secondary | ICD-10-CM | POA: Diagnosis not present

## 2022-12-31 DIAGNOSIS — I509 Heart failure, unspecified: Secondary | ICD-10-CM | POA: Diagnosis not present

## 2022-12-31 DIAGNOSIS — J302 Other seasonal allergic rhinitis: Secondary | ICD-10-CM | POA: Diagnosis not present

## 2022-12-31 DIAGNOSIS — I110 Hypertensive heart disease with heart failure: Secondary | ICD-10-CM | POA: Diagnosis not present

## 2022-12-31 DIAGNOSIS — G20A1 Parkinson's disease without dyskinesia, without mention of fluctuations: Secondary | ICD-10-CM | POA: Diagnosis not present

## 2022-12-31 DIAGNOSIS — Z960 Presence of urogenital implants: Secondary | ICD-10-CM | POA: Diagnosis not present

## 2022-12-31 DIAGNOSIS — R630 Anorexia: Secondary | ICD-10-CM | POA: Diagnosis not present

## 2022-12-31 DIAGNOSIS — M479 Spondylosis, unspecified: Secondary | ICD-10-CM | POA: Diagnosis not present

## 2022-12-31 DIAGNOSIS — N952 Postmenopausal atrophic vaginitis: Secondary | ICD-10-CM | POA: Diagnosis not present

## 2022-12-31 DIAGNOSIS — E119 Type 2 diabetes mellitus without complications: Secondary | ICD-10-CM | POA: Diagnosis not present

## 2022-12-31 DIAGNOSIS — R000 Tachycardia, unspecified: Secondary | ICD-10-CM | POA: Diagnosis not present

## 2022-12-31 DIAGNOSIS — K219 Gastro-esophageal reflux disease without esophagitis: Secondary | ICD-10-CM | POA: Diagnosis not present

## 2022-12-31 DIAGNOSIS — I951 Orthostatic hypotension: Secondary | ICD-10-CM | POA: Diagnosis not present

## 2023-01-02 DIAGNOSIS — G8194 Hemiplegia, unspecified affecting left nondominant side: Secondary | ICD-10-CM | POA: Diagnosis not present

## 2023-01-02 DIAGNOSIS — I951 Orthostatic hypotension: Secondary | ICD-10-CM | POA: Diagnosis not present

## 2023-01-02 DIAGNOSIS — R630 Anorexia: Secondary | ICD-10-CM | POA: Diagnosis not present

## 2023-01-02 DIAGNOSIS — E119 Type 2 diabetes mellitus without complications: Secondary | ICD-10-CM | POA: Diagnosis not present

## 2023-01-02 DIAGNOSIS — I110 Hypertensive heart disease with heart failure: Secondary | ICD-10-CM | POA: Diagnosis not present

## 2023-01-02 DIAGNOSIS — G20A1 Parkinson's disease without dyskinesia, without mention of fluctuations: Secondary | ICD-10-CM | POA: Diagnosis not present

## 2023-01-03 DIAGNOSIS — G20A1 Parkinson's disease without dyskinesia, without mention of fluctuations: Secondary | ICD-10-CM | POA: Diagnosis not present

## 2023-01-03 DIAGNOSIS — E119 Type 2 diabetes mellitus without complications: Secondary | ICD-10-CM | POA: Diagnosis not present

## 2023-01-03 DIAGNOSIS — I110 Hypertensive heart disease with heart failure: Secondary | ICD-10-CM | POA: Diagnosis not present

## 2023-01-03 DIAGNOSIS — I951 Orthostatic hypotension: Secondary | ICD-10-CM | POA: Diagnosis not present

## 2023-01-03 DIAGNOSIS — G8194 Hemiplegia, unspecified affecting left nondominant side: Secondary | ICD-10-CM | POA: Diagnosis not present

## 2023-01-03 DIAGNOSIS — R630 Anorexia: Secondary | ICD-10-CM | POA: Diagnosis not present

## 2023-01-04 DIAGNOSIS — E119 Type 2 diabetes mellitus without complications: Secondary | ICD-10-CM | POA: Diagnosis not present

## 2023-01-04 DIAGNOSIS — G8194 Hemiplegia, unspecified affecting left nondominant side: Secondary | ICD-10-CM | POA: Diagnosis not present

## 2023-01-04 DIAGNOSIS — I951 Orthostatic hypotension: Secondary | ICD-10-CM | POA: Diagnosis not present

## 2023-01-04 DIAGNOSIS — I110 Hypertensive heart disease with heart failure: Secondary | ICD-10-CM | POA: Diagnosis not present

## 2023-01-04 DIAGNOSIS — R630 Anorexia: Secondary | ICD-10-CM | POA: Diagnosis not present

## 2023-01-04 DIAGNOSIS — G20A1 Parkinson's disease without dyskinesia, without mention of fluctuations: Secondary | ICD-10-CM | POA: Diagnosis not present

## 2023-01-06 DIAGNOSIS — R630 Anorexia: Secondary | ICD-10-CM | POA: Diagnosis not present

## 2023-01-06 DIAGNOSIS — I951 Orthostatic hypotension: Secondary | ICD-10-CM | POA: Diagnosis not present

## 2023-01-06 DIAGNOSIS — G20A1 Parkinson's disease without dyskinesia, without mention of fluctuations: Secondary | ICD-10-CM | POA: Diagnosis not present

## 2023-01-06 DIAGNOSIS — I110 Hypertensive heart disease with heart failure: Secondary | ICD-10-CM | POA: Diagnosis not present

## 2023-01-06 DIAGNOSIS — E119 Type 2 diabetes mellitus without complications: Secondary | ICD-10-CM | POA: Diagnosis not present

## 2023-01-06 DIAGNOSIS — G8194 Hemiplegia, unspecified affecting left nondominant side: Secondary | ICD-10-CM | POA: Diagnosis not present

## 2023-01-09 DIAGNOSIS — E119 Type 2 diabetes mellitus without complications: Secondary | ICD-10-CM | POA: Diagnosis not present

## 2023-01-09 DIAGNOSIS — G8194 Hemiplegia, unspecified affecting left nondominant side: Secondary | ICD-10-CM | POA: Diagnosis not present

## 2023-01-09 DIAGNOSIS — I951 Orthostatic hypotension: Secondary | ICD-10-CM | POA: Diagnosis not present

## 2023-01-09 DIAGNOSIS — R630 Anorexia: Secondary | ICD-10-CM | POA: Diagnosis not present

## 2023-01-09 DIAGNOSIS — I110 Hypertensive heart disease with heart failure: Secondary | ICD-10-CM | POA: Diagnosis not present

## 2023-01-09 DIAGNOSIS — G20A1 Parkinson's disease without dyskinesia, without mention of fluctuations: Secondary | ICD-10-CM | POA: Diagnosis not present

## 2023-01-10 DIAGNOSIS — I951 Orthostatic hypotension: Secondary | ICD-10-CM | POA: Diagnosis not present

## 2023-01-10 DIAGNOSIS — E119 Type 2 diabetes mellitus without complications: Secondary | ICD-10-CM | POA: Diagnosis not present

## 2023-01-10 DIAGNOSIS — I110 Hypertensive heart disease with heart failure: Secondary | ICD-10-CM | POA: Diagnosis not present

## 2023-01-10 DIAGNOSIS — G20A1 Parkinson's disease without dyskinesia, without mention of fluctuations: Secondary | ICD-10-CM | POA: Diagnosis not present

## 2023-01-10 DIAGNOSIS — G8194 Hemiplegia, unspecified affecting left nondominant side: Secondary | ICD-10-CM | POA: Diagnosis not present

## 2023-01-10 DIAGNOSIS — R630 Anorexia: Secondary | ICD-10-CM | POA: Diagnosis not present

## 2023-01-11 DIAGNOSIS — G8194 Hemiplegia, unspecified affecting left nondominant side: Secondary | ICD-10-CM | POA: Diagnosis not present

## 2023-01-11 DIAGNOSIS — I951 Orthostatic hypotension: Secondary | ICD-10-CM | POA: Diagnosis not present

## 2023-01-11 DIAGNOSIS — E119 Type 2 diabetes mellitus without complications: Secondary | ICD-10-CM | POA: Diagnosis not present

## 2023-01-11 DIAGNOSIS — I110 Hypertensive heart disease with heart failure: Secondary | ICD-10-CM | POA: Diagnosis not present

## 2023-01-11 DIAGNOSIS — G20A1 Parkinson's disease without dyskinesia, without mention of fluctuations: Secondary | ICD-10-CM | POA: Diagnosis not present

## 2023-01-11 DIAGNOSIS — R630 Anorexia: Secondary | ICD-10-CM | POA: Diagnosis not present

## 2023-01-13 DIAGNOSIS — G8194 Hemiplegia, unspecified affecting left nondominant side: Secondary | ICD-10-CM | POA: Diagnosis not present

## 2023-01-13 DIAGNOSIS — R630 Anorexia: Secondary | ICD-10-CM | POA: Diagnosis not present

## 2023-01-13 DIAGNOSIS — I110 Hypertensive heart disease with heart failure: Secondary | ICD-10-CM | POA: Diagnosis not present

## 2023-01-13 DIAGNOSIS — G20A1 Parkinson's disease without dyskinesia, without mention of fluctuations: Secondary | ICD-10-CM | POA: Diagnosis not present

## 2023-01-13 DIAGNOSIS — E119 Type 2 diabetes mellitus without complications: Secondary | ICD-10-CM | POA: Diagnosis not present

## 2023-01-13 DIAGNOSIS — I951 Orthostatic hypotension: Secondary | ICD-10-CM | POA: Diagnosis not present

## 2023-01-15 DIAGNOSIS — I110 Hypertensive heart disease with heart failure: Secondary | ICD-10-CM | POA: Diagnosis not present

## 2023-01-15 DIAGNOSIS — I951 Orthostatic hypotension: Secondary | ICD-10-CM | POA: Diagnosis not present

## 2023-01-15 DIAGNOSIS — E119 Type 2 diabetes mellitus without complications: Secondary | ICD-10-CM | POA: Diagnosis not present

## 2023-01-15 DIAGNOSIS — R630 Anorexia: Secondary | ICD-10-CM | POA: Diagnosis not present

## 2023-01-15 DIAGNOSIS — G8194 Hemiplegia, unspecified affecting left nondominant side: Secondary | ICD-10-CM | POA: Diagnosis not present

## 2023-01-15 DIAGNOSIS — G20A1 Parkinson's disease without dyskinesia, without mention of fluctuations: Secondary | ICD-10-CM | POA: Diagnosis not present

## 2023-01-16 DIAGNOSIS — I951 Orthostatic hypotension: Secondary | ICD-10-CM | POA: Diagnosis not present

## 2023-01-16 DIAGNOSIS — G20A1 Parkinson's disease without dyskinesia, without mention of fluctuations: Secondary | ICD-10-CM | POA: Diagnosis not present

## 2023-01-16 DIAGNOSIS — G8194 Hemiplegia, unspecified affecting left nondominant side: Secondary | ICD-10-CM | POA: Diagnosis not present

## 2023-01-16 DIAGNOSIS — I110 Hypertensive heart disease with heart failure: Secondary | ICD-10-CM | POA: Diagnosis not present

## 2023-01-16 DIAGNOSIS — R630 Anorexia: Secondary | ICD-10-CM | POA: Diagnosis not present

## 2023-01-16 DIAGNOSIS — E119 Type 2 diabetes mellitus without complications: Secondary | ICD-10-CM | POA: Diagnosis not present

## 2023-01-17 DIAGNOSIS — G8194 Hemiplegia, unspecified affecting left nondominant side: Secondary | ICD-10-CM | POA: Diagnosis not present

## 2023-01-17 DIAGNOSIS — I951 Orthostatic hypotension: Secondary | ICD-10-CM | POA: Diagnosis not present

## 2023-01-17 DIAGNOSIS — I110 Hypertensive heart disease with heart failure: Secondary | ICD-10-CM | POA: Diagnosis not present

## 2023-01-17 DIAGNOSIS — G20A1 Parkinson's disease without dyskinesia, without mention of fluctuations: Secondary | ICD-10-CM | POA: Diagnosis not present

## 2023-01-17 DIAGNOSIS — R630 Anorexia: Secondary | ICD-10-CM | POA: Diagnosis not present

## 2023-01-17 DIAGNOSIS — E119 Type 2 diabetes mellitus without complications: Secondary | ICD-10-CM | POA: Diagnosis not present

## 2023-01-18 DIAGNOSIS — R630 Anorexia: Secondary | ICD-10-CM | POA: Diagnosis not present

## 2023-01-18 DIAGNOSIS — I110 Hypertensive heart disease with heart failure: Secondary | ICD-10-CM | POA: Diagnosis not present

## 2023-01-18 DIAGNOSIS — I951 Orthostatic hypotension: Secondary | ICD-10-CM | POA: Diagnosis not present

## 2023-01-18 DIAGNOSIS — G8194 Hemiplegia, unspecified affecting left nondominant side: Secondary | ICD-10-CM | POA: Diagnosis not present

## 2023-01-18 DIAGNOSIS — E119 Type 2 diabetes mellitus without complications: Secondary | ICD-10-CM | POA: Diagnosis not present

## 2023-01-18 DIAGNOSIS — G20A1 Parkinson's disease without dyskinesia, without mention of fluctuations: Secondary | ICD-10-CM | POA: Diagnosis not present

## 2023-01-20 DIAGNOSIS — E119 Type 2 diabetes mellitus without complications: Secondary | ICD-10-CM | POA: Diagnosis not present

## 2023-01-20 DIAGNOSIS — G20A1 Parkinson's disease without dyskinesia, without mention of fluctuations: Secondary | ICD-10-CM | POA: Diagnosis not present

## 2023-01-20 DIAGNOSIS — I951 Orthostatic hypotension: Secondary | ICD-10-CM | POA: Diagnosis not present

## 2023-01-20 DIAGNOSIS — R630 Anorexia: Secondary | ICD-10-CM | POA: Diagnosis not present

## 2023-01-20 DIAGNOSIS — I110 Hypertensive heart disease with heart failure: Secondary | ICD-10-CM | POA: Diagnosis not present

## 2023-01-20 DIAGNOSIS — G8194 Hemiplegia, unspecified affecting left nondominant side: Secondary | ICD-10-CM | POA: Diagnosis not present

## 2023-01-23 DIAGNOSIS — R630 Anorexia: Secondary | ICD-10-CM | POA: Diagnosis not present

## 2023-01-23 DIAGNOSIS — I951 Orthostatic hypotension: Secondary | ICD-10-CM | POA: Diagnosis not present

## 2023-01-23 DIAGNOSIS — E119 Type 2 diabetes mellitus without complications: Secondary | ICD-10-CM | POA: Diagnosis not present

## 2023-01-23 DIAGNOSIS — I110 Hypertensive heart disease with heart failure: Secondary | ICD-10-CM | POA: Diagnosis not present

## 2023-01-23 DIAGNOSIS — G20A1 Parkinson's disease without dyskinesia, without mention of fluctuations: Secondary | ICD-10-CM | POA: Diagnosis not present

## 2023-01-23 DIAGNOSIS — G8194 Hemiplegia, unspecified affecting left nondominant side: Secondary | ICD-10-CM | POA: Diagnosis not present

## 2023-01-24 DIAGNOSIS — I951 Orthostatic hypotension: Secondary | ICD-10-CM | POA: Diagnosis not present

## 2023-01-24 DIAGNOSIS — G8194 Hemiplegia, unspecified affecting left nondominant side: Secondary | ICD-10-CM | POA: Diagnosis not present

## 2023-01-24 DIAGNOSIS — R630 Anorexia: Secondary | ICD-10-CM | POA: Diagnosis not present

## 2023-01-24 DIAGNOSIS — I110 Hypertensive heart disease with heart failure: Secondary | ICD-10-CM | POA: Diagnosis not present

## 2023-01-24 DIAGNOSIS — G20A1 Parkinson's disease without dyskinesia, without mention of fluctuations: Secondary | ICD-10-CM | POA: Diagnosis not present

## 2023-01-24 DIAGNOSIS — E119 Type 2 diabetes mellitus without complications: Secondary | ICD-10-CM | POA: Diagnosis not present

## 2023-01-25 DIAGNOSIS — G8194 Hemiplegia, unspecified affecting left nondominant side: Secondary | ICD-10-CM | POA: Diagnosis not present

## 2023-01-25 DIAGNOSIS — I110 Hypertensive heart disease with heart failure: Secondary | ICD-10-CM | POA: Diagnosis not present

## 2023-01-25 DIAGNOSIS — I951 Orthostatic hypotension: Secondary | ICD-10-CM | POA: Diagnosis not present

## 2023-01-25 DIAGNOSIS — G20A1 Parkinson's disease without dyskinesia, without mention of fluctuations: Secondary | ICD-10-CM | POA: Diagnosis not present

## 2023-01-25 DIAGNOSIS — R630 Anorexia: Secondary | ICD-10-CM | POA: Diagnosis not present

## 2023-01-25 DIAGNOSIS — E119 Type 2 diabetes mellitus without complications: Secondary | ICD-10-CM | POA: Diagnosis not present

## 2023-01-27 DIAGNOSIS — R630 Anorexia: Secondary | ICD-10-CM | POA: Diagnosis not present

## 2023-01-27 DIAGNOSIS — G20A1 Parkinson's disease without dyskinesia, without mention of fluctuations: Secondary | ICD-10-CM | POA: Diagnosis not present

## 2023-01-27 DIAGNOSIS — I110 Hypertensive heart disease with heart failure: Secondary | ICD-10-CM | POA: Diagnosis not present

## 2023-01-27 DIAGNOSIS — G8194 Hemiplegia, unspecified affecting left nondominant side: Secondary | ICD-10-CM | POA: Diagnosis not present

## 2023-01-27 DIAGNOSIS — I951 Orthostatic hypotension: Secondary | ICD-10-CM | POA: Diagnosis not present

## 2023-01-27 DIAGNOSIS — E119 Type 2 diabetes mellitus without complications: Secondary | ICD-10-CM | POA: Diagnosis not present

## 2023-01-29 ENCOUNTER — Other Ambulatory Visit: Payer: Self-pay | Admitting: Neurology

## 2023-01-29 DIAGNOSIS — G20B1 Parkinson's disease with dyskinesia, without mention of fluctuations: Secondary | ICD-10-CM

## 2023-01-30 DIAGNOSIS — I951 Orthostatic hypotension: Secondary | ICD-10-CM | POA: Diagnosis not present

## 2023-01-30 DIAGNOSIS — I509 Heart failure, unspecified: Secondary | ICD-10-CM | POA: Diagnosis not present

## 2023-01-30 DIAGNOSIS — E119 Type 2 diabetes mellitus without complications: Secondary | ICD-10-CM | POA: Diagnosis not present

## 2023-01-30 DIAGNOSIS — K219 Gastro-esophageal reflux disease without esophagitis: Secondary | ICD-10-CM | POA: Diagnosis not present

## 2023-01-30 DIAGNOSIS — I428 Other cardiomyopathies: Secondary | ICD-10-CM | POA: Diagnosis not present

## 2023-01-30 DIAGNOSIS — Z960 Presence of urogenital implants: Secondary | ICD-10-CM | POA: Diagnosis not present

## 2023-01-30 DIAGNOSIS — R630 Anorexia: Secondary | ICD-10-CM | POA: Diagnosis not present

## 2023-01-30 DIAGNOSIS — M479 Spondylosis, unspecified: Secondary | ICD-10-CM | POA: Diagnosis not present

## 2023-01-30 DIAGNOSIS — N952 Postmenopausal atrophic vaginitis: Secondary | ICD-10-CM | POA: Diagnosis not present

## 2023-01-30 DIAGNOSIS — G8194 Hemiplegia, unspecified affecting left nondominant side: Secondary | ICD-10-CM | POA: Diagnosis not present

## 2023-01-30 DIAGNOSIS — J302 Other seasonal allergic rhinitis: Secondary | ICD-10-CM | POA: Diagnosis not present

## 2023-01-30 DIAGNOSIS — I110 Hypertensive heart disease with heart failure: Secondary | ICD-10-CM | POA: Diagnosis not present

## 2023-01-30 DIAGNOSIS — G20A1 Parkinson's disease without dyskinesia, without mention of fluctuations: Secondary | ICD-10-CM | POA: Diagnosis not present

## 2023-01-30 DIAGNOSIS — R000 Tachycardia, unspecified: Secondary | ICD-10-CM | POA: Diagnosis not present

## 2023-01-30 DIAGNOSIS — E785 Hyperlipidemia, unspecified: Secondary | ICD-10-CM | POA: Diagnosis not present

## 2023-01-31 DIAGNOSIS — G20A1 Parkinson's disease without dyskinesia, without mention of fluctuations: Secondary | ICD-10-CM | POA: Diagnosis not present

## 2023-01-31 DIAGNOSIS — I951 Orthostatic hypotension: Secondary | ICD-10-CM | POA: Diagnosis not present

## 2023-01-31 DIAGNOSIS — R630 Anorexia: Secondary | ICD-10-CM | POA: Diagnosis not present

## 2023-01-31 DIAGNOSIS — I110 Hypertensive heart disease with heart failure: Secondary | ICD-10-CM | POA: Diagnosis not present

## 2023-01-31 DIAGNOSIS — G8194 Hemiplegia, unspecified affecting left nondominant side: Secondary | ICD-10-CM | POA: Diagnosis not present

## 2023-01-31 DIAGNOSIS — E119 Type 2 diabetes mellitus without complications: Secondary | ICD-10-CM | POA: Diagnosis not present

## 2023-02-01 DIAGNOSIS — I951 Orthostatic hypotension: Secondary | ICD-10-CM | POA: Diagnosis not present

## 2023-02-01 DIAGNOSIS — E119 Type 2 diabetes mellitus without complications: Secondary | ICD-10-CM | POA: Diagnosis not present

## 2023-02-01 DIAGNOSIS — R630 Anorexia: Secondary | ICD-10-CM | POA: Diagnosis not present

## 2023-02-01 DIAGNOSIS — I110 Hypertensive heart disease with heart failure: Secondary | ICD-10-CM | POA: Diagnosis not present

## 2023-02-01 DIAGNOSIS — G8194 Hemiplegia, unspecified affecting left nondominant side: Secondary | ICD-10-CM | POA: Diagnosis not present

## 2023-02-01 DIAGNOSIS — G20A1 Parkinson's disease without dyskinesia, without mention of fluctuations: Secondary | ICD-10-CM | POA: Diagnosis not present

## 2023-02-03 DIAGNOSIS — R630 Anorexia: Secondary | ICD-10-CM | POA: Diagnosis not present

## 2023-02-03 DIAGNOSIS — I951 Orthostatic hypotension: Secondary | ICD-10-CM | POA: Diagnosis not present

## 2023-02-03 DIAGNOSIS — G20A1 Parkinson's disease without dyskinesia, without mention of fluctuations: Secondary | ICD-10-CM | POA: Diagnosis not present

## 2023-02-03 DIAGNOSIS — E119 Type 2 diabetes mellitus without complications: Secondary | ICD-10-CM | POA: Diagnosis not present

## 2023-02-03 DIAGNOSIS — G8194 Hemiplegia, unspecified affecting left nondominant side: Secondary | ICD-10-CM | POA: Diagnosis not present

## 2023-02-03 DIAGNOSIS — I110 Hypertensive heart disease with heart failure: Secondary | ICD-10-CM | POA: Diagnosis not present

## 2023-02-06 DIAGNOSIS — R630 Anorexia: Secondary | ICD-10-CM | POA: Diagnosis not present

## 2023-02-06 DIAGNOSIS — G8194 Hemiplegia, unspecified affecting left nondominant side: Secondary | ICD-10-CM | POA: Diagnosis not present

## 2023-02-06 DIAGNOSIS — E119 Type 2 diabetes mellitus without complications: Secondary | ICD-10-CM | POA: Diagnosis not present

## 2023-02-06 DIAGNOSIS — I110 Hypertensive heart disease with heart failure: Secondary | ICD-10-CM | POA: Diagnosis not present

## 2023-02-06 DIAGNOSIS — I951 Orthostatic hypotension: Secondary | ICD-10-CM | POA: Diagnosis not present

## 2023-02-06 DIAGNOSIS — G20A1 Parkinson's disease without dyskinesia, without mention of fluctuations: Secondary | ICD-10-CM | POA: Diagnosis not present

## 2023-02-07 DIAGNOSIS — G20A1 Parkinson's disease without dyskinesia, without mention of fluctuations: Secondary | ICD-10-CM | POA: Diagnosis not present

## 2023-02-07 DIAGNOSIS — I110 Hypertensive heart disease with heart failure: Secondary | ICD-10-CM | POA: Diagnosis not present

## 2023-02-07 DIAGNOSIS — R630 Anorexia: Secondary | ICD-10-CM | POA: Diagnosis not present

## 2023-02-07 DIAGNOSIS — E119 Type 2 diabetes mellitus without complications: Secondary | ICD-10-CM | POA: Diagnosis not present

## 2023-02-07 DIAGNOSIS — I951 Orthostatic hypotension: Secondary | ICD-10-CM | POA: Diagnosis not present

## 2023-02-07 DIAGNOSIS — G8194 Hemiplegia, unspecified affecting left nondominant side: Secondary | ICD-10-CM | POA: Diagnosis not present

## 2023-02-08 DIAGNOSIS — G20A1 Parkinson's disease without dyskinesia, without mention of fluctuations: Secondary | ICD-10-CM | POA: Diagnosis not present

## 2023-02-08 DIAGNOSIS — G8194 Hemiplegia, unspecified affecting left nondominant side: Secondary | ICD-10-CM | POA: Diagnosis not present

## 2023-02-08 DIAGNOSIS — R630 Anorexia: Secondary | ICD-10-CM | POA: Diagnosis not present

## 2023-02-08 DIAGNOSIS — I110 Hypertensive heart disease with heart failure: Secondary | ICD-10-CM | POA: Diagnosis not present

## 2023-02-08 DIAGNOSIS — E119 Type 2 diabetes mellitus without complications: Secondary | ICD-10-CM | POA: Diagnosis not present

## 2023-02-08 DIAGNOSIS — I951 Orthostatic hypotension: Secondary | ICD-10-CM | POA: Diagnosis not present

## 2023-02-09 DIAGNOSIS — G8194 Hemiplegia, unspecified affecting left nondominant side: Secondary | ICD-10-CM | POA: Diagnosis not present

## 2023-02-09 DIAGNOSIS — E119 Type 2 diabetes mellitus without complications: Secondary | ICD-10-CM | POA: Diagnosis not present

## 2023-02-09 DIAGNOSIS — G20A1 Parkinson's disease without dyskinesia, without mention of fluctuations: Secondary | ICD-10-CM | POA: Diagnosis not present

## 2023-02-09 DIAGNOSIS — I951 Orthostatic hypotension: Secondary | ICD-10-CM | POA: Diagnosis not present

## 2023-02-09 DIAGNOSIS — R630 Anorexia: Secondary | ICD-10-CM | POA: Diagnosis not present

## 2023-02-09 DIAGNOSIS — I110 Hypertensive heart disease with heart failure: Secondary | ICD-10-CM | POA: Diagnosis not present

## 2023-02-10 DIAGNOSIS — G20A1 Parkinson's disease without dyskinesia, without mention of fluctuations: Secondary | ICD-10-CM | POA: Diagnosis not present

## 2023-02-10 DIAGNOSIS — E119 Type 2 diabetes mellitus without complications: Secondary | ICD-10-CM | POA: Diagnosis not present

## 2023-02-10 DIAGNOSIS — I951 Orthostatic hypotension: Secondary | ICD-10-CM | POA: Diagnosis not present

## 2023-02-10 DIAGNOSIS — G8194 Hemiplegia, unspecified affecting left nondominant side: Secondary | ICD-10-CM | POA: Diagnosis not present

## 2023-02-10 DIAGNOSIS — R630 Anorexia: Secondary | ICD-10-CM | POA: Diagnosis not present

## 2023-02-10 DIAGNOSIS — I110 Hypertensive heart disease with heart failure: Secondary | ICD-10-CM | POA: Diagnosis not present

## 2023-02-13 DIAGNOSIS — E119 Type 2 diabetes mellitus without complications: Secondary | ICD-10-CM | POA: Diagnosis not present

## 2023-02-13 DIAGNOSIS — G8194 Hemiplegia, unspecified affecting left nondominant side: Secondary | ICD-10-CM | POA: Diagnosis not present

## 2023-02-13 DIAGNOSIS — I110 Hypertensive heart disease with heart failure: Secondary | ICD-10-CM | POA: Diagnosis not present

## 2023-02-13 DIAGNOSIS — G20A1 Parkinson's disease without dyskinesia, without mention of fluctuations: Secondary | ICD-10-CM | POA: Diagnosis not present

## 2023-02-13 DIAGNOSIS — R630 Anorexia: Secondary | ICD-10-CM | POA: Diagnosis not present

## 2023-02-13 DIAGNOSIS — I951 Orthostatic hypotension: Secondary | ICD-10-CM | POA: Diagnosis not present

## 2023-02-15 DIAGNOSIS — G20A1 Parkinson's disease without dyskinesia, without mention of fluctuations: Secondary | ICD-10-CM | POA: Diagnosis not present

## 2023-02-15 DIAGNOSIS — E119 Type 2 diabetes mellitus without complications: Secondary | ICD-10-CM | POA: Diagnosis not present

## 2023-02-15 DIAGNOSIS — G8194 Hemiplegia, unspecified affecting left nondominant side: Secondary | ICD-10-CM | POA: Diagnosis not present

## 2023-02-15 DIAGNOSIS — R630 Anorexia: Secondary | ICD-10-CM | POA: Diagnosis not present

## 2023-02-15 DIAGNOSIS — I951 Orthostatic hypotension: Secondary | ICD-10-CM | POA: Diagnosis not present

## 2023-02-15 DIAGNOSIS — I110 Hypertensive heart disease with heart failure: Secondary | ICD-10-CM | POA: Diagnosis not present

## 2023-02-17 DIAGNOSIS — G8194 Hemiplegia, unspecified affecting left nondominant side: Secondary | ICD-10-CM | POA: Diagnosis not present

## 2023-02-17 DIAGNOSIS — I951 Orthostatic hypotension: Secondary | ICD-10-CM | POA: Diagnosis not present

## 2023-02-17 DIAGNOSIS — E119 Type 2 diabetes mellitus without complications: Secondary | ICD-10-CM | POA: Diagnosis not present

## 2023-02-17 DIAGNOSIS — R630 Anorexia: Secondary | ICD-10-CM | POA: Diagnosis not present

## 2023-02-17 DIAGNOSIS — I110 Hypertensive heart disease with heart failure: Secondary | ICD-10-CM | POA: Diagnosis not present

## 2023-02-17 DIAGNOSIS — G20A1 Parkinson's disease without dyskinesia, without mention of fluctuations: Secondary | ICD-10-CM | POA: Diagnosis not present

## 2023-02-20 DIAGNOSIS — G20A1 Parkinson's disease without dyskinesia, without mention of fluctuations: Secondary | ICD-10-CM | POA: Diagnosis not present

## 2023-02-20 DIAGNOSIS — I951 Orthostatic hypotension: Secondary | ICD-10-CM | POA: Diagnosis not present

## 2023-02-20 DIAGNOSIS — E119 Type 2 diabetes mellitus without complications: Secondary | ICD-10-CM | POA: Diagnosis not present

## 2023-02-20 DIAGNOSIS — I110 Hypertensive heart disease with heart failure: Secondary | ICD-10-CM | POA: Diagnosis not present

## 2023-02-20 DIAGNOSIS — G8194 Hemiplegia, unspecified affecting left nondominant side: Secondary | ICD-10-CM | POA: Diagnosis not present

## 2023-02-20 DIAGNOSIS — R630 Anorexia: Secondary | ICD-10-CM | POA: Diagnosis not present

## 2023-02-22 DIAGNOSIS — I110 Hypertensive heart disease with heart failure: Secondary | ICD-10-CM | POA: Diagnosis not present

## 2023-02-22 DIAGNOSIS — E119 Type 2 diabetes mellitus without complications: Secondary | ICD-10-CM | POA: Diagnosis not present

## 2023-02-22 DIAGNOSIS — R630 Anorexia: Secondary | ICD-10-CM | POA: Diagnosis not present

## 2023-02-22 DIAGNOSIS — I951 Orthostatic hypotension: Secondary | ICD-10-CM | POA: Diagnosis not present

## 2023-02-22 DIAGNOSIS — G8194 Hemiplegia, unspecified affecting left nondominant side: Secondary | ICD-10-CM | POA: Diagnosis not present

## 2023-02-22 DIAGNOSIS — G20A1 Parkinson's disease without dyskinesia, without mention of fluctuations: Secondary | ICD-10-CM | POA: Diagnosis not present

## 2023-02-23 ENCOUNTER — Ambulatory Visit (INDEPENDENT_AMBULATORY_CARE_PROVIDER_SITE_OTHER): Payer: Medicare Other

## 2023-02-23 DIAGNOSIS — I428 Other cardiomyopathies: Secondary | ICD-10-CM

## 2023-02-24 DIAGNOSIS — R630 Anorexia: Secondary | ICD-10-CM | POA: Diagnosis not present

## 2023-02-24 DIAGNOSIS — G8194 Hemiplegia, unspecified affecting left nondominant side: Secondary | ICD-10-CM | POA: Diagnosis not present

## 2023-02-24 DIAGNOSIS — I951 Orthostatic hypotension: Secondary | ICD-10-CM | POA: Diagnosis not present

## 2023-02-24 DIAGNOSIS — E119 Type 2 diabetes mellitus without complications: Secondary | ICD-10-CM | POA: Diagnosis not present

## 2023-02-24 DIAGNOSIS — I110 Hypertensive heart disease with heart failure: Secondary | ICD-10-CM | POA: Diagnosis not present

## 2023-02-24 DIAGNOSIS — G20A1 Parkinson's disease without dyskinesia, without mention of fluctuations: Secondary | ICD-10-CM | POA: Diagnosis not present

## 2023-02-27 DIAGNOSIS — R630 Anorexia: Secondary | ICD-10-CM | POA: Diagnosis not present

## 2023-02-27 DIAGNOSIS — G20A1 Parkinson's disease without dyskinesia, without mention of fluctuations: Secondary | ICD-10-CM | POA: Diagnosis not present

## 2023-02-27 DIAGNOSIS — I110 Hypertensive heart disease with heart failure: Secondary | ICD-10-CM | POA: Diagnosis not present

## 2023-02-27 DIAGNOSIS — G8194 Hemiplegia, unspecified affecting left nondominant side: Secondary | ICD-10-CM | POA: Diagnosis not present

## 2023-02-27 DIAGNOSIS — I951 Orthostatic hypotension: Secondary | ICD-10-CM | POA: Diagnosis not present

## 2023-02-27 DIAGNOSIS — E119 Type 2 diabetes mellitus without complications: Secondary | ICD-10-CM | POA: Diagnosis not present

## 2023-02-28 DIAGNOSIS — E119 Type 2 diabetes mellitus without complications: Secondary | ICD-10-CM | POA: Diagnosis not present

## 2023-02-28 DIAGNOSIS — G20A1 Parkinson's disease without dyskinesia, without mention of fluctuations: Secondary | ICD-10-CM | POA: Diagnosis not present

## 2023-02-28 DIAGNOSIS — R630 Anorexia: Secondary | ICD-10-CM | POA: Diagnosis not present

## 2023-02-28 DIAGNOSIS — I110 Hypertensive heart disease with heart failure: Secondary | ICD-10-CM | POA: Diagnosis not present

## 2023-02-28 DIAGNOSIS — G8194 Hemiplegia, unspecified affecting left nondominant side: Secondary | ICD-10-CM | POA: Diagnosis not present

## 2023-02-28 DIAGNOSIS — I951 Orthostatic hypotension: Secondary | ICD-10-CM | POA: Diagnosis not present

## 2023-03-01 DIAGNOSIS — G20A1 Parkinson's disease without dyskinesia, without mention of fluctuations: Secondary | ICD-10-CM | POA: Diagnosis not present

## 2023-03-01 DIAGNOSIS — I110 Hypertensive heart disease with heart failure: Secondary | ICD-10-CM | POA: Diagnosis not present

## 2023-03-01 DIAGNOSIS — R630 Anorexia: Secondary | ICD-10-CM | POA: Diagnosis not present

## 2023-03-01 DIAGNOSIS — G8194 Hemiplegia, unspecified affecting left nondominant side: Secondary | ICD-10-CM | POA: Diagnosis not present

## 2023-03-01 DIAGNOSIS — I951 Orthostatic hypotension: Secondary | ICD-10-CM | POA: Diagnosis not present

## 2023-03-01 DIAGNOSIS — E119 Type 2 diabetes mellitus without complications: Secondary | ICD-10-CM | POA: Diagnosis not present

## 2023-03-02 DIAGNOSIS — I509 Heart failure, unspecified: Secondary | ICD-10-CM | POA: Diagnosis not present

## 2023-03-02 DIAGNOSIS — Z960 Presence of urogenital implants: Secondary | ICD-10-CM | POA: Diagnosis not present

## 2023-03-02 DIAGNOSIS — J302 Other seasonal allergic rhinitis: Secondary | ICD-10-CM | POA: Diagnosis not present

## 2023-03-02 DIAGNOSIS — E785 Hyperlipidemia, unspecified: Secondary | ICD-10-CM | POA: Diagnosis not present

## 2023-03-02 DIAGNOSIS — E119 Type 2 diabetes mellitus without complications: Secondary | ICD-10-CM | POA: Diagnosis not present

## 2023-03-02 DIAGNOSIS — G8194 Hemiplegia, unspecified affecting left nondominant side: Secondary | ICD-10-CM | POA: Diagnosis not present

## 2023-03-02 DIAGNOSIS — K219 Gastro-esophageal reflux disease without esophagitis: Secondary | ICD-10-CM | POA: Diagnosis not present

## 2023-03-02 DIAGNOSIS — R000 Tachycardia, unspecified: Secondary | ICD-10-CM | POA: Diagnosis not present

## 2023-03-02 DIAGNOSIS — N952 Postmenopausal atrophic vaginitis: Secondary | ICD-10-CM | POA: Diagnosis not present

## 2023-03-02 DIAGNOSIS — I110 Hypertensive heart disease with heart failure: Secondary | ICD-10-CM | POA: Diagnosis not present

## 2023-03-02 DIAGNOSIS — I951 Orthostatic hypotension: Secondary | ICD-10-CM | POA: Diagnosis not present

## 2023-03-02 DIAGNOSIS — G20A1 Parkinson's disease without dyskinesia, without mention of fluctuations: Secondary | ICD-10-CM | POA: Diagnosis not present

## 2023-03-02 DIAGNOSIS — R630 Anorexia: Secondary | ICD-10-CM | POA: Diagnosis not present

## 2023-03-02 DIAGNOSIS — M479 Spondylosis, unspecified: Secondary | ICD-10-CM | POA: Diagnosis not present

## 2023-03-02 DIAGNOSIS — I428 Other cardiomyopathies: Secondary | ICD-10-CM | POA: Diagnosis not present

## 2023-03-03 DIAGNOSIS — I110 Hypertensive heart disease with heart failure: Secondary | ICD-10-CM | POA: Diagnosis not present

## 2023-03-03 DIAGNOSIS — I951 Orthostatic hypotension: Secondary | ICD-10-CM | POA: Diagnosis not present

## 2023-03-03 DIAGNOSIS — R630 Anorexia: Secondary | ICD-10-CM | POA: Diagnosis not present

## 2023-03-03 DIAGNOSIS — G8194 Hemiplegia, unspecified affecting left nondominant side: Secondary | ICD-10-CM | POA: Diagnosis not present

## 2023-03-03 DIAGNOSIS — E119 Type 2 diabetes mellitus without complications: Secondary | ICD-10-CM | POA: Diagnosis not present

## 2023-03-03 DIAGNOSIS — G20A1 Parkinson's disease without dyskinesia, without mention of fluctuations: Secondary | ICD-10-CM | POA: Diagnosis not present

## 2023-03-04 DIAGNOSIS — I110 Hypertensive heart disease with heart failure: Secondary | ICD-10-CM | POA: Diagnosis not present

## 2023-03-04 DIAGNOSIS — I951 Orthostatic hypotension: Secondary | ICD-10-CM | POA: Diagnosis not present

## 2023-03-04 DIAGNOSIS — G20A1 Parkinson's disease without dyskinesia, without mention of fluctuations: Secondary | ICD-10-CM | POA: Diagnosis not present

## 2023-03-04 DIAGNOSIS — E119 Type 2 diabetes mellitus without complications: Secondary | ICD-10-CM | POA: Diagnosis not present

## 2023-03-04 DIAGNOSIS — R630 Anorexia: Secondary | ICD-10-CM | POA: Diagnosis not present

## 2023-03-04 DIAGNOSIS — G8194 Hemiplegia, unspecified affecting left nondominant side: Secondary | ICD-10-CM | POA: Diagnosis not present

## 2023-03-06 DIAGNOSIS — G8194 Hemiplegia, unspecified affecting left nondominant side: Secondary | ICD-10-CM | POA: Diagnosis not present

## 2023-03-06 DIAGNOSIS — I110 Hypertensive heart disease with heart failure: Secondary | ICD-10-CM | POA: Diagnosis not present

## 2023-03-06 DIAGNOSIS — I951 Orthostatic hypotension: Secondary | ICD-10-CM | POA: Diagnosis not present

## 2023-03-06 DIAGNOSIS — E119 Type 2 diabetes mellitus without complications: Secondary | ICD-10-CM | POA: Diagnosis not present

## 2023-03-06 DIAGNOSIS — G20A1 Parkinson's disease without dyskinesia, without mention of fluctuations: Secondary | ICD-10-CM | POA: Diagnosis not present

## 2023-03-06 DIAGNOSIS — R630 Anorexia: Secondary | ICD-10-CM | POA: Diagnosis not present

## 2023-03-07 NOTE — Progress Notes (Signed)
Remote ICD transmission.   

## 2023-03-08 DIAGNOSIS — E119 Type 2 diabetes mellitus without complications: Secondary | ICD-10-CM | POA: Diagnosis not present

## 2023-03-08 DIAGNOSIS — G20A1 Parkinson's disease without dyskinesia, without mention of fluctuations: Secondary | ICD-10-CM | POA: Diagnosis not present

## 2023-03-08 DIAGNOSIS — R630 Anorexia: Secondary | ICD-10-CM | POA: Diagnosis not present

## 2023-03-08 DIAGNOSIS — G8194 Hemiplegia, unspecified affecting left nondominant side: Secondary | ICD-10-CM | POA: Diagnosis not present

## 2023-03-08 DIAGNOSIS — I110 Hypertensive heart disease with heart failure: Secondary | ICD-10-CM | POA: Diagnosis not present

## 2023-03-08 DIAGNOSIS — I951 Orthostatic hypotension: Secondary | ICD-10-CM | POA: Diagnosis not present

## 2023-03-09 DIAGNOSIS — I110 Hypertensive heart disease with heart failure: Secondary | ICD-10-CM | POA: Diagnosis not present

## 2023-03-09 DIAGNOSIS — R630 Anorexia: Secondary | ICD-10-CM | POA: Diagnosis not present

## 2023-03-09 DIAGNOSIS — G8194 Hemiplegia, unspecified affecting left nondominant side: Secondary | ICD-10-CM | POA: Diagnosis not present

## 2023-03-09 DIAGNOSIS — E119 Type 2 diabetes mellitus without complications: Secondary | ICD-10-CM | POA: Diagnosis not present

## 2023-03-09 DIAGNOSIS — G20A1 Parkinson's disease without dyskinesia, without mention of fluctuations: Secondary | ICD-10-CM | POA: Diagnosis not present

## 2023-03-09 DIAGNOSIS — I951 Orthostatic hypotension: Secondary | ICD-10-CM | POA: Diagnosis not present

## 2023-03-10 DIAGNOSIS — G20A1 Parkinson's disease without dyskinesia, without mention of fluctuations: Secondary | ICD-10-CM | POA: Diagnosis not present

## 2023-03-10 DIAGNOSIS — E119 Type 2 diabetes mellitus without complications: Secondary | ICD-10-CM | POA: Diagnosis not present

## 2023-03-10 DIAGNOSIS — I951 Orthostatic hypotension: Secondary | ICD-10-CM | POA: Diagnosis not present

## 2023-03-10 DIAGNOSIS — R630 Anorexia: Secondary | ICD-10-CM | POA: Diagnosis not present

## 2023-03-10 DIAGNOSIS — G8194 Hemiplegia, unspecified affecting left nondominant side: Secondary | ICD-10-CM | POA: Diagnosis not present

## 2023-03-10 DIAGNOSIS — I110 Hypertensive heart disease with heart failure: Secondary | ICD-10-CM | POA: Diagnosis not present

## 2023-03-13 DIAGNOSIS — G8194 Hemiplegia, unspecified affecting left nondominant side: Secondary | ICD-10-CM | POA: Diagnosis not present

## 2023-03-13 DIAGNOSIS — G20A1 Parkinson's disease without dyskinesia, without mention of fluctuations: Secondary | ICD-10-CM | POA: Diagnosis not present

## 2023-03-13 DIAGNOSIS — I951 Orthostatic hypotension: Secondary | ICD-10-CM | POA: Diagnosis not present

## 2023-03-13 DIAGNOSIS — I110 Hypertensive heart disease with heart failure: Secondary | ICD-10-CM | POA: Diagnosis not present

## 2023-03-13 DIAGNOSIS — R630 Anorexia: Secondary | ICD-10-CM | POA: Diagnosis not present

## 2023-03-13 DIAGNOSIS — E119 Type 2 diabetes mellitus without complications: Secondary | ICD-10-CM | POA: Diagnosis not present

## 2023-03-15 ENCOUNTER — Encounter: Payer: Self-pay | Admitting: Internal Medicine

## 2023-03-15 ENCOUNTER — Ambulatory Visit: Payer: Medicare Other | Attending: Internal Medicine | Admitting: Internal Medicine

## 2023-03-15 VITALS — BP 114/60 | HR 66 | Ht 61.0 in | Wt 105.0 lb

## 2023-03-15 DIAGNOSIS — I951 Orthostatic hypotension: Secondary | ICD-10-CM | POA: Diagnosis not present

## 2023-03-15 DIAGNOSIS — I110 Hypertensive heart disease with heart failure: Secondary | ICD-10-CM | POA: Diagnosis not present

## 2023-03-15 DIAGNOSIS — R630 Anorexia: Secondary | ICD-10-CM | POA: Diagnosis not present

## 2023-03-15 DIAGNOSIS — I5022 Chronic systolic (congestive) heart failure: Secondary | ICD-10-CM | POA: Diagnosis not present

## 2023-03-15 DIAGNOSIS — G20A1 Parkinson's disease without dyskinesia, without mention of fluctuations: Secondary | ICD-10-CM | POA: Diagnosis not present

## 2023-03-15 DIAGNOSIS — G8194 Hemiplegia, unspecified affecting left nondominant side: Secondary | ICD-10-CM | POA: Diagnosis not present

## 2023-03-15 DIAGNOSIS — E119 Type 2 diabetes mellitus without complications: Secondary | ICD-10-CM | POA: Diagnosis not present

## 2023-03-15 LAB — CUP PACEART INCLINIC DEVICE CHECK
Battery Remaining Longevity: 11 mo
Battery Voltage: 2.82 V
Brady Statistic AP VP Percent: 0 %
Brady Statistic AP VS Percent: 0 %
Brady Statistic AS VP Percent: 0.03 %
Brady Statistic AS VS Percent: 99.97 %
Brady Statistic RA Percent Paced: 0 %
Brady Statistic RV Percent Paced: 0.03 %
Date Time Interrogation Session: 20240814155739
HighPow Impedance: 68 Ohm
Implantable Lead Connection Status: 753985
Implantable Lead Connection Status: 753985
Implantable Lead Implant Date: 20150427
Implantable Lead Implant Date: 20150427
Implantable Lead Location: 753859
Implantable Lead Location: 753860
Implantable Lead Model: 5076
Implantable Lead Model: 6935
Implantable Pulse Generator Implant Date: 20150427
Lead Channel Impedance Value: 342 Ohm
Lead Channel Impedance Value: 361 Ohm
Lead Channel Impedance Value: 4047 Ohm
Lead Channel Impedance Value: 4047 Ohm
Lead Channel Impedance Value: 4047 Ohm
Lead Channel Impedance Value: 418 Ohm
Lead Channel Pacing Threshold Amplitude: 0.625 V
Lead Channel Pacing Threshold Amplitude: 0.875 V
Lead Channel Pacing Threshold Pulse Width: 0.4 ms
Lead Channel Pacing Threshold Pulse Width: 0.4 ms
Lead Channel Sensing Intrinsic Amplitude: 1.375 mV
Lead Channel Sensing Intrinsic Amplitude: 1.625 mV
Lead Channel Sensing Intrinsic Amplitude: 16.375 mV
Lead Channel Sensing Intrinsic Amplitude: 18 mV
Lead Channel Setting Pacing Amplitude: 2 V
Lead Channel Setting Pacing Amplitude: 2.5 V
Lead Channel Setting Pacing Pulse Width: 0.4 ms
Lead Channel Setting Sensing Sensitivity: 0.3 mV
Zone Setting Status: 755011
Zone Setting Status: 755011
Zone Setting Status: 755011

## 2023-03-15 NOTE — Patient Instructions (Signed)
Medication Instructions:  Your physician recommends that you continue on your current medications as directed. Please refer to the Current Medication list given to you today.  *If you need a refill on your cardiac medications before your next appointment, please call your pharmacy*  Lab Work: None ordered.  If you have labs (blood work) drawn today and your tests are completely normal, you will receive your results only by: MyChart Message (if you have MyChart) OR A paper copy in the mail If you have any lab test that is abnormal or we need to change your treatment, we will call you to review the results.  Testing/Procedures: None ordered.  Follow-Up: At [facility], you and your health needs are our priority.  As part of our continuing mission to provide you with exceptional heart care, we have created designated Provider Care Teams.  These Care Teams include your primary Cardiologist (physician) and Advanced Practice Providers (APPs -  Physician Assistants and Nurse Practitioners) who all work together to provide you with the care you need, when you need it.  We recommend signing up for the patient portal called "MyChart".  Sign up information is provided on this After Visit Summary.  MyChart is used to connect with patients for Virtual Visits (Telemedicine).  Patients are able to view lab/test results, encounter notes, upcoming appointments, etc.  Non-urgent messages can be sent to your provider as well.   To learn more about what you can do with MyChart, go to [email or web address].    Your next appointment:   1 year(s)  The format for your next appointment:   In Person  Provider:   Lewayne Bunting, MD{or one of the following Advanced Practice Providers on your designated Care Team:   Francis Dowse, [place] Casimiro Needle "Mardelle Matte" Coleman, [place] Earnest Rosier, NP  Remote monitoring is used to monitor your Pacemaker/ ICD from home. This monitoring reduces the number of office visits required  to check your device to one time per year. It allows [place] to keep an eye on the functioning of your device to ensure it is working properly. You are scheduled for a device check from home on ***. You may send your transmission at any time that day. If you have a wireless device, the transmission will be sent automatically. After your physician reviews your transmission, you will receive a postcard with your next transmission date.  Important Information About Sugar

## 2023-03-15 NOTE — Progress Notes (Signed)
HPI [name] returns today for followup. She is a pleasant 72 yo woman with Parkinson's disease and LV dysfunction s/p ICD insertion. She has mod LV dysfunction with a EF of 40% after ICD insertion. She and her primary cardiologist Dr. Excell Seltzer have requested that her ICD shock therapies be turned off. She is mostly in a wheel chair. She has lost weight.  Allergies  Allergen Reactions   Aspirin Swelling and Other (See Comments)    Other reaction(s): Other (See Comments) Causes nose bleeds *ONLY THE COATED ASA* Causes nose bleeds *ONLY THE COATED ASA*   Mirtazapine Other (See Comments)    Hulucinations, sucidal thoughts   Penicillins Shortness Of Breath and Rash    Shortness of Breath - Throat felt like it was closing.    Prempro [Conj Estrog-Medroxyprogest Ace] Shortness Of Breath    Throat swelling Throat swelling   Ace Inhibitors Cough   Simvastatin Other (See Comments) and Rash    Muscle aches     Current Outpatient Medications  Medication Sig Dispense Refill   acetaminophen (TYLENOL) 500 MG tablet Take 2 tablets (1,000 mg total) by mouth every 8 (eight) hours as needed. 30 tablet 0   albuterol (PROVENTIL) (2.5 MG/3ML) 0.083% nebulizer solution Inhale into the lungs.     albuterol (VENTOLIN HFA) 108 (90 Base) MCG/ACT inhaler Inhale into the lungs.     carbidopa-levodopa (SINEMET IR) 25-100 MG tablet TAKE 2 TABLETS AT 7 AM, 2 TABLETS AT 11AM, AND 1 TABLET AT 4 PM 450 tablet 0   diclofenac Sodium (VOLTAREN) 1 % GEL APPLY 4 GRAMS TOPICALLY FOUR TIMES DAILY 200 g 0   estradiol (ESTRACE) 0.1 MG/GM vaginal cream every other day.     famotidine (PEPCID) 20 MG tablet TAKE 1 TABLET TWICE DAILY (Patient taking differently: Take 20 mg by mouth 2 (two) times daily.) 180 tablet 2   gabapentin (NEURONTIN) 300 MG capsule Take 300 mg by mouth 3 (three) times daily.     meclizine (ANTIVERT) 25 MG tablet Take 1 tablet (25 mg total) by mouth 3 (three) times daily as needed for dizziness. 30  tablet 0   melatonin 5 MG TABS Take 5 mg by mouth at bedtime.     metFORMIN (GLUCOPHAGE-XR) 500 MG 24 hr tablet Take 500 mg by mouth daily.     montelukast (SINGULAIR) 10 MG tablet Take 1 tablet (10 mg total) by mouth at bedtime. 90 tablet 2   OXYGEN Inhale into the lungs at bedtime. Inhale 2L into lungs     polyethylene glycol (MIRALAX / GLYCOLAX) 17 g packet Take 17 g by mouth daily. 14 each 0   rosuvastatin (CRESTOR) 10 MG tablet Take 1 tablet (10 mg total) by mouth as directed. Three times a week 40 tablet 3   sulfamethoxazole-trimethoprim (BACTRIM DS) 800-160 MG tablet Take 1 tablet by mouth daily.     No current facility-administered medications for this visit.     Past Medical History:  Diagnosis Date   Acute on chronic systolic CHF (congestive heart failure), NYHA class 4 (HCC) 10/19/2020   Asthma    Back pain 09/18/2019   Cervical disc disorder with radiculopathy of cervical region 09/24/2018   Chronic systolic CHF (congestive heart failure) (HCC)    a. cMRI 4/15: EF 34% and findings - c/w NICM, normal RV size and function (RVEF 61%), Mild MR // b. Echo 2/15:  EF 30-35%, diff HK, ant-sept AK, Gr 2 DD, mild MR, trivial TR  //  c. Echo 5/17: EF 20-25%, severe diffuse HK, marked systolic dyssynchrony, grade 1 diastolic dysfunction, mild MR  //  d. RHC 5/17: Fick CO 2.9, RVSP 19, PASP 15, PW mean 2, low filing pressures and preserved CO    Cognitive impairment 10/19/2017   MOCA was administered with a score of 23/30   Diabetes mellitus    Diabetic peripheral neuropathy (HCC) 12/09/2011   Dyspnea 09/19/2012   Flu 10/17/2017   Gastritis    History of echocardiogram    Echo 6/18: EF 30-35, diffuse HK, grade 1 diastolic dysfunction, trivial MR, mild LAE, mild TR, no pericardial effusion   History of nuclear stress test    Myoview 5/18: EF 49, no ischemia, inferoseptal defect c/w LBBB artifact (intermediate risk due to EF < 50).   HTN (hypertension)    Hyperlipidemia    Hyperlipidemia  associated with type 2 diabetes mellitus (HCC) 03/05/2019   Hypertension associated with diabetes (HCC) 09/28/2006   Qualifier: Diagnosis of  By: Abundio Miu     Major depression 03/31/2013   Melena 08/21/2020   NICM (nonischemic cardiomyopathy) (HCC)    a. Nuclear 5/13: Normal stress nuclear study. LV Ejection Fraction: 58%  //  b. LHC 10/14: Minor luminal irregularity in prox LAD, EF 35%    Parkinson disease    Plantar fasciitis    Plantar fasciitis, bilateral 06/15/2012   Rosacea 05/29/2009   Qualifier: Diagnosis of  By: Constance Goltz MD, Marcie Mowers hearing loss (SNHL), bilateral 01/04/2017   Sleep apnea    was retested and no longer had it and so d/c CPAP   Syncope    Thoracic or lumbosacral neuritis or radiculitis, unspecified 07/03/2013   Tinnitus, bilateral 01/04/2017   Type II diabetes mellitus with complication (HCC) 09/28/2006   Diabetic eye exam done by [facility]mology: Dr. Randon Goldsmith on 11/08/13: no diabetic retinopathy. Repeat in 1 year     Urticaria     ROS:   All systems reviewed and negative except as noted in the HPI.   Past Surgical History:  Procedure Laterality Date   BREAST EXCISIONAL BIOPSY Left 1970   benign cyst   BREAST SURGERY Left    BUNIONECTOMY     CARDIAC CATHETERIZATION N/A 12/16/2015   Procedure: Right Heart Cath;  Surgeon: [name], MD;  Location: [facility] INVASIVE CV LAB;  Service: Cardiovascular;  Laterality: N/A;   CHOLECYSTECTOMY     eye lid surgery Bilateral 05/09/2019   IMPLANTABLE CARDIOVERTER DEFIBRILLATOR IMPLANT  11-25-13   MDT dual chamber ICD implanted by Dr Ladona Ridgel for primary prevention   IMPLANTABLE CARDIOVERTER DEFIBRILLATOR IMPLANT N/A 11/25/2013   Procedure: IMPLANTABLE CARDIOVERTER DEFIBRILLATOR IMPLANT;  Surgeon: Marinus Maw, MD;  Location: [facility] CATH LAB;  Service: Cardiovascular;  Laterality: N/A;   LEFT AND RIGHT HEART CATHETERIZATION WITH CORONARY ANGIOGRAM N/A 05/31/2013   Procedure: LEFT AND RIGHT HEART  CATHETERIZATION WITH CORONARY ANGIOGRAM;  Surgeon: Micheline Chapman, MD;  Location: [facility] CATH LAB;  Service: Cardiovascular;  Laterality: N/A;   TONSILLECTOMY     TUBAL LIGATION       Family History  Problem Relation Age of Onset   Coronary artery disease Father        Died age 73   Heart attack Father    Diabetes Father    Coronary artery disease Mother        Died age 61   Heart attack Mother    Diabetes Mother    Parkinson's disease Sister    Heart  disease Sister    Hepatitis C Sister    Diabetes Sister    Diabetes Son 45       T1DM   Healthy Daughter    Diabetes Sister    Heart disease Sister    Diabetes Sister    Heart disease Sister    Breast cancer Neg Hx      Social History   Socioeconomic History   Marital status: Married    Spouse name: Molly Maduro    Number of children: 2   Years of education: 12   Highest education level: 12th grade  Occupational History   Occupation: retired    Comment: CNA  Tobacco Use   Smoking status: Never    Passive exposure: Past   Smokeless tobacco: Never   Tobacco comments:    exposed to smoke during child hood (parents)  Vaping Use   Vaping status: Never Used  Substance and Sexual Activity   Alcohol use: No    Alcohol/week: 0.0 standard drinks of alcohol   Drug use: No   Sexual activity: Not on file  Other Topics Concern   Not on file  Social History Narrative   Patient lives with her husband Molly Maduro.   Patient enjoys spending time with her family, cooking, and her 2 dogs.    Patient has one daughter who lives locally, and one son who lives in [place].   Social Determinants of Health   Financial Resource Strain: Low Risk  (07/01/2020)   Overall Financial Resource Strain (CARDIA)    Difficulty of Paying Living Expenses: Not hard at all  Food Insecurity: No Food Insecurity (07/01/2020)   Hunger Vital Sign    Worried About Running Out of Food in the Last Year: Never true    Ran Out of Food in the Last Year: Never true   Transportation Needs: No Transportation Needs (07/01/2020)   PRAPARE - Administrator, Civil Service (Medical): No    Lack of Transportation (Non-Medical): No  Physical Activity: Sufficiently Active (07/01/2020)   Exercise Vital Sign    Days of Exercise per Week: 7 days    Minutes of Exercise per Session: 30 min  Stress: No Stress Concern Present (07/01/2020)   [employer] of Occupational Health - Occupational Stress Questionnaire    Feeling of Stress : Not at all  Social Connections: Unknown (12/14/2021)   Received from [facility]   Social Network    Social Network: Not on file  Intimate Partner Violence: Unknown (11/04/2021)   Received from [facility]   HITS    Physically Hurt: Not on file    Insult or Talk Down To: Not on file    Threaten Physical Harm: Not on file    Scream or Curse: Not on file     BP 114/60   Pulse 66   Ht 5\' 1"  (1.549 m)   Wt 105 lb (47.6 kg)   LMP 08/01/2000 (Approximate)   SpO2 91%   BMI 19.84 kg/m   Physical Exam:  Well appearing NAD HEENT: Unremarkable Neck:  No JVD, no thyromegally Lymphatics:  No adenopathy Back:  No CVA tenderness Lungs:  Clear with no wheezes HEART:  Regular rate rhythm, no murmurs, no rubs, no clicks Abd:  soft, positive bowel sounds, no organomegally, no rebound, no guarding Ext:  2 plus pulses, no edema, no cyanosis, no clubbing Skin:  No rashes no nodules Neuro:  CN II through XII intact, motor grossly intact  DEVICE  Normal device function.  See PaceArt for details.   Assess/Plan: Chronic systolic heart failure - her symptoms are class 2. She will continue her current meds. ICD - her tachy therapies have been disabled. No changes otherwise.  Sharlot Gowda ,MD

## 2023-03-17 DIAGNOSIS — I951 Orthostatic hypotension: Secondary | ICD-10-CM | POA: Diagnosis not present

## 2023-03-17 DIAGNOSIS — G8194 Hemiplegia, unspecified affecting left nondominant side: Secondary | ICD-10-CM | POA: Diagnosis not present

## 2023-03-17 DIAGNOSIS — I110 Hypertensive heart disease with heart failure: Secondary | ICD-10-CM | POA: Diagnosis not present

## 2023-03-17 DIAGNOSIS — E119 Type 2 diabetes mellitus without complications: Secondary | ICD-10-CM | POA: Diagnosis not present

## 2023-03-17 DIAGNOSIS — R630 Anorexia: Secondary | ICD-10-CM | POA: Diagnosis not present

## 2023-03-17 DIAGNOSIS — G20A1 Parkinson's disease without dyskinesia, without mention of fluctuations: Secondary | ICD-10-CM | POA: Diagnosis not present

## 2023-03-20 ENCOUNTER — Encounter: Payer: Medicare Other | Admitting: Physical Medicine and Rehabilitation

## 2023-03-20 DIAGNOSIS — E119 Type 2 diabetes mellitus without complications: Secondary | ICD-10-CM | POA: Diagnosis not present

## 2023-03-20 DIAGNOSIS — G8194 Hemiplegia, unspecified affecting left nondominant side: Secondary | ICD-10-CM | POA: Diagnosis not present

## 2023-03-20 DIAGNOSIS — G20A1 Parkinson's disease without dyskinesia, without mention of fluctuations: Secondary | ICD-10-CM | POA: Diagnosis not present

## 2023-03-20 DIAGNOSIS — I110 Hypertensive heart disease with heart failure: Secondary | ICD-10-CM | POA: Diagnosis not present

## 2023-03-20 DIAGNOSIS — I951 Orthostatic hypotension: Secondary | ICD-10-CM | POA: Diagnosis not present

## 2023-03-20 DIAGNOSIS — R630 Anorexia: Secondary | ICD-10-CM | POA: Diagnosis not present

## 2023-03-22 DIAGNOSIS — G8194 Hemiplegia, unspecified affecting left nondominant side: Secondary | ICD-10-CM | POA: Diagnosis not present

## 2023-03-22 DIAGNOSIS — I110 Hypertensive heart disease with heart failure: Secondary | ICD-10-CM | POA: Diagnosis not present

## 2023-03-22 DIAGNOSIS — E119 Type 2 diabetes mellitus without complications: Secondary | ICD-10-CM | POA: Diagnosis not present

## 2023-03-22 DIAGNOSIS — I951 Orthostatic hypotension: Secondary | ICD-10-CM | POA: Diagnosis not present

## 2023-03-22 DIAGNOSIS — R630 Anorexia: Secondary | ICD-10-CM | POA: Diagnosis not present

## 2023-03-22 DIAGNOSIS — G20A1 Parkinson's disease without dyskinesia, without mention of fluctuations: Secondary | ICD-10-CM | POA: Diagnosis not present

## 2023-03-24 DIAGNOSIS — R630 Anorexia: Secondary | ICD-10-CM | POA: Diagnosis not present

## 2023-03-24 DIAGNOSIS — E119 Type 2 diabetes mellitus without complications: Secondary | ICD-10-CM | POA: Diagnosis not present

## 2023-03-24 DIAGNOSIS — I951 Orthostatic hypotension: Secondary | ICD-10-CM | POA: Diagnosis not present

## 2023-03-24 DIAGNOSIS — G8194 Hemiplegia, unspecified affecting left nondominant side: Secondary | ICD-10-CM | POA: Diagnosis not present

## 2023-03-24 DIAGNOSIS — I110 Hypertensive heart disease with heart failure: Secondary | ICD-10-CM | POA: Diagnosis not present

## 2023-03-24 DIAGNOSIS — G20A1 Parkinson's disease without dyskinesia, without mention of fluctuations: Secondary | ICD-10-CM | POA: Diagnosis not present

## 2023-03-27 DIAGNOSIS — G8194 Hemiplegia, unspecified affecting left nondominant side: Secondary | ICD-10-CM | POA: Diagnosis not present

## 2023-03-27 DIAGNOSIS — R630 Anorexia: Secondary | ICD-10-CM | POA: Diagnosis not present

## 2023-03-27 DIAGNOSIS — G20A1 Parkinson's disease without dyskinesia, without mention of fluctuations: Secondary | ICD-10-CM | POA: Diagnosis not present

## 2023-03-27 DIAGNOSIS — E119 Type 2 diabetes mellitus without complications: Secondary | ICD-10-CM | POA: Diagnosis not present

## 2023-03-27 DIAGNOSIS — I951 Orthostatic hypotension: Secondary | ICD-10-CM | POA: Diagnosis not present

## 2023-03-27 DIAGNOSIS — I110 Hypertensive heart disease with heart failure: Secondary | ICD-10-CM | POA: Diagnosis not present

## 2023-03-29 DIAGNOSIS — I951 Orthostatic hypotension: Secondary | ICD-10-CM | POA: Diagnosis not present

## 2023-03-29 DIAGNOSIS — G8194 Hemiplegia, unspecified affecting left nondominant side: Secondary | ICD-10-CM | POA: Diagnosis not present

## 2023-03-29 DIAGNOSIS — R630 Anorexia: Secondary | ICD-10-CM | POA: Diagnosis not present

## 2023-03-29 DIAGNOSIS — G20A1 Parkinson's disease without dyskinesia, without mention of fluctuations: Secondary | ICD-10-CM | POA: Diagnosis not present

## 2023-03-29 DIAGNOSIS — E119 Type 2 diabetes mellitus without complications: Secondary | ICD-10-CM | POA: Diagnosis not present

## 2023-03-29 DIAGNOSIS — I110 Hypertensive heart disease with heart failure: Secondary | ICD-10-CM | POA: Diagnosis not present

## 2023-03-30 DIAGNOSIS — R630 Anorexia: Secondary | ICD-10-CM | POA: Diagnosis not present

## 2023-03-30 DIAGNOSIS — E119 Type 2 diabetes mellitus without complications: Secondary | ICD-10-CM | POA: Diagnosis not present

## 2023-03-30 DIAGNOSIS — I110 Hypertensive heart disease with heart failure: Secondary | ICD-10-CM | POA: Diagnosis not present

## 2023-03-30 DIAGNOSIS — G20A1 Parkinson's disease without dyskinesia, without mention of fluctuations: Secondary | ICD-10-CM | POA: Diagnosis not present

## 2023-03-30 DIAGNOSIS — G8194 Hemiplegia, unspecified affecting left nondominant side: Secondary | ICD-10-CM | POA: Diagnosis not present

## 2023-03-30 DIAGNOSIS — I951 Orthostatic hypotension: Secondary | ICD-10-CM | POA: Diagnosis not present

## 2023-03-31 DIAGNOSIS — I110 Hypertensive heart disease with heart failure: Secondary | ICD-10-CM | POA: Diagnosis not present

## 2023-03-31 DIAGNOSIS — R630 Anorexia: Secondary | ICD-10-CM | POA: Diagnosis not present

## 2023-03-31 DIAGNOSIS — G8194 Hemiplegia, unspecified affecting left nondominant side: Secondary | ICD-10-CM | POA: Diagnosis not present

## 2023-03-31 DIAGNOSIS — E119 Type 2 diabetes mellitus without complications: Secondary | ICD-10-CM | POA: Diagnosis not present

## 2023-03-31 DIAGNOSIS — G20A1 Parkinson's disease without dyskinesia, without mention of fluctuations: Secondary | ICD-10-CM | POA: Diagnosis not present

## 2023-03-31 DIAGNOSIS — I951 Orthostatic hypotension: Secondary | ICD-10-CM | POA: Diagnosis not present

## 2023-04-02 DIAGNOSIS — G20A1 Parkinson's disease without dyskinesia, without mention of fluctuations: Secondary | ICD-10-CM | POA: Diagnosis not present

## 2023-04-02 DIAGNOSIS — I509 Heart failure, unspecified: Secondary | ICD-10-CM | POA: Diagnosis not present

## 2023-04-02 DIAGNOSIS — I951 Orthostatic hypotension: Secondary | ICD-10-CM | POA: Diagnosis not present

## 2023-04-02 DIAGNOSIS — M479 Spondylosis, unspecified: Secondary | ICD-10-CM | POA: Diagnosis not present

## 2023-04-02 DIAGNOSIS — E785 Hyperlipidemia, unspecified: Secondary | ICD-10-CM | POA: Diagnosis not present

## 2023-04-02 DIAGNOSIS — I110 Hypertensive heart disease with heart failure: Secondary | ICD-10-CM | POA: Diagnosis not present

## 2023-04-02 DIAGNOSIS — K219 Gastro-esophageal reflux disease without esophagitis: Secondary | ICD-10-CM | POA: Diagnosis not present

## 2023-04-02 DIAGNOSIS — E119 Type 2 diabetes mellitus without complications: Secondary | ICD-10-CM | POA: Diagnosis not present

## 2023-04-02 DIAGNOSIS — G8194 Hemiplegia, unspecified affecting left nondominant side: Secondary | ICD-10-CM | POA: Diagnosis not present

## 2023-04-02 DIAGNOSIS — I428 Other cardiomyopathies: Secondary | ICD-10-CM | POA: Diagnosis not present

## 2023-04-02 DIAGNOSIS — R000 Tachycardia, unspecified: Secondary | ICD-10-CM | POA: Diagnosis not present

## 2023-04-02 DIAGNOSIS — R630 Anorexia: Secondary | ICD-10-CM | POA: Diagnosis not present

## 2023-04-03 DIAGNOSIS — R630 Anorexia: Secondary | ICD-10-CM | POA: Diagnosis not present

## 2023-04-03 DIAGNOSIS — I110 Hypertensive heart disease with heart failure: Secondary | ICD-10-CM | POA: Diagnosis not present

## 2023-04-03 DIAGNOSIS — I951 Orthostatic hypotension: Secondary | ICD-10-CM | POA: Diagnosis not present

## 2023-04-03 DIAGNOSIS — E119 Type 2 diabetes mellitus without complications: Secondary | ICD-10-CM | POA: Diagnosis not present

## 2023-04-03 DIAGNOSIS — G8194 Hemiplegia, unspecified affecting left nondominant side: Secondary | ICD-10-CM | POA: Diagnosis not present

## 2023-04-03 DIAGNOSIS — G20A1 Parkinson's disease without dyskinesia, without mention of fluctuations: Secondary | ICD-10-CM | POA: Diagnosis not present

## 2023-04-04 DIAGNOSIS — I951 Orthostatic hypotension: Secondary | ICD-10-CM | POA: Diagnosis not present

## 2023-04-04 DIAGNOSIS — I110 Hypertensive heart disease with heart failure: Secondary | ICD-10-CM | POA: Diagnosis not present

## 2023-04-04 DIAGNOSIS — E119 Type 2 diabetes mellitus without complications: Secondary | ICD-10-CM | POA: Diagnosis not present

## 2023-04-04 DIAGNOSIS — R630 Anorexia: Secondary | ICD-10-CM | POA: Diagnosis not present

## 2023-04-04 DIAGNOSIS — G8194 Hemiplegia, unspecified affecting left nondominant side: Secondary | ICD-10-CM | POA: Diagnosis not present

## 2023-04-04 DIAGNOSIS — N189 Chronic kidney disease, unspecified: Secondary | ICD-10-CM | POA: Diagnosis not present

## 2023-04-04 DIAGNOSIS — G20A1 Parkinson's disease without dyskinesia, without mention of fluctuations: Secondary | ICD-10-CM | POA: Diagnosis not present

## 2023-04-04 DIAGNOSIS — N1831 Chronic kidney disease, stage 3a: Secondary | ICD-10-CM | POA: Diagnosis not present

## 2023-04-05 DIAGNOSIS — R630 Anorexia: Secondary | ICD-10-CM | POA: Diagnosis not present

## 2023-04-05 DIAGNOSIS — G20A1 Parkinson's disease without dyskinesia, without mention of fluctuations: Secondary | ICD-10-CM | POA: Diagnosis not present

## 2023-04-05 DIAGNOSIS — E119 Type 2 diabetes mellitus without complications: Secondary | ICD-10-CM | POA: Diagnosis not present

## 2023-04-05 DIAGNOSIS — I951 Orthostatic hypotension: Secondary | ICD-10-CM | POA: Diagnosis not present

## 2023-04-05 DIAGNOSIS — G8194 Hemiplegia, unspecified affecting left nondominant side: Secondary | ICD-10-CM | POA: Diagnosis not present

## 2023-04-05 DIAGNOSIS — I110 Hypertensive heart disease with heart failure: Secondary | ICD-10-CM | POA: Diagnosis not present

## 2023-04-06 DIAGNOSIS — G8194 Hemiplegia, unspecified affecting left nondominant side: Secondary | ICD-10-CM | POA: Diagnosis not present

## 2023-04-06 DIAGNOSIS — I951 Orthostatic hypotension: Secondary | ICD-10-CM | POA: Diagnosis not present

## 2023-04-06 DIAGNOSIS — R630 Anorexia: Secondary | ICD-10-CM | POA: Diagnosis not present

## 2023-04-06 DIAGNOSIS — G20A1 Parkinson's disease without dyskinesia, without mention of fluctuations: Secondary | ICD-10-CM | POA: Diagnosis not present

## 2023-04-06 DIAGNOSIS — E119 Type 2 diabetes mellitus without complications: Secondary | ICD-10-CM | POA: Diagnosis not present

## 2023-04-06 DIAGNOSIS — I110 Hypertensive heart disease with heart failure: Secondary | ICD-10-CM | POA: Diagnosis not present

## 2023-04-07 DIAGNOSIS — R630 Anorexia: Secondary | ICD-10-CM | POA: Diagnosis not present

## 2023-04-07 DIAGNOSIS — G8194 Hemiplegia, unspecified affecting left nondominant side: Secondary | ICD-10-CM | POA: Diagnosis not present

## 2023-04-07 DIAGNOSIS — E119 Type 2 diabetes mellitus without complications: Secondary | ICD-10-CM | POA: Diagnosis not present

## 2023-04-07 DIAGNOSIS — G20A1 Parkinson's disease without dyskinesia, without mention of fluctuations: Secondary | ICD-10-CM | POA: Diagnosis not present

## 2023-04-07 DIAGNOSIS — I951 Orthostatic hypotension: Secondary | ICD-10-CM | POA: Diagnosis not present

## 2023-04-07 DIAGNOSIS — I110 Hypertensive heart disease with heart failure: Secondary | ICD-10-CM | POA: Diagnosis not present

## 2023-04-08 DIAGNOSIS — I110 Hypertensive heart disease with heart failure: Secondary | ICD-10-CM | POA: Diagnosis not present

## 2023-04-08 DIAGNOSIS — G20A1 Parkinson's disease without dyskinesia, without mention of fluctuations: Secondary | ICD-10-CM | POA: Diagnosis not present

## 2023-04-08 DIAGNOSIS — G8194 Hemiplegia, unspecified affecting left nondominant side: Secondary | ICD-10-CM | POA: Diagnosis not present

## 2023-04-08 DIAGNOSIS — E119 Type 2 diabetes mellitus without complications: Secondary | ICD-10-CM | POA: Diagnosis not present

## 2023-04-08 DIAGNOSIS — I951 Orthostatic hypotension: Secondary | ICD-10-CM | POA: Diagnosis not present

## 2023-04-08 DIAGNOSIS — R630 Anorexia: Secondary | ICD-10-CM | POA: Diagnosis not present

## 2023-04-09 DIAGNOSIS — I951 Orthostatic hypotension: Secondary | ICD-10-CM | POA: Diagnosis not present

## 2023-04-09 DIAGNOSIS — G8194 Hemiplegia, unspecified affecting left nondominant side: Secondary | ICD-10-CM | POA: Diagnosis not present

## 2023-04-09 DIAGNOSIS — E119 Type 2 diabetes mellitus without complications: Secondary | ICD-10-CM | POA: Diagnosis not present

## 2023-04-09 DIAGNOSIS — G20A1 Parkinson's disease without dyskinesia, without mention of fluctuations: Secondary | ICD-10-CM | POA: Diagnosis not present

## 2023-04-09 DIAGNOSIS — I110 Hypertensive heart disease with heart failure: Secondary | ICD-10-CM | POA: Diagnosis not present

## 2023-04-09 DIAGNOSIS — R630 Anorexia: Secondary | ICD-10-CM | POA: Diagnosis not present

## 2023-04-10 DIAGNOSIS — G20A1 Parkinson's disease without dyskinesia, without mention of fluctuations: Secondary | ICD-10-CM | POA: Diagnosis not present

## 2023-04-10 DIAGNOSIS — I110 Hypertensive heart disease with heart failure: Secondary | ICD-10-CM | POA: Diagnosis not present

## 2023-04-10 DIAGNOSIS — G8194 Hemiplegia, unspecified affecting left nondominant side: Secondary | ICD-10-CM | POA: Diagnosis not present

## 2023-04-10 DIAGNOSIS — E119 Type 2 diabetes mellitus without complications: Secondary | ICD-10-CM | POA: Diagnosis not present

## 2023-04-10 DIAGNOSIS — R630 Anorexia: Secondary | ICD-10-CM | POA: Diagnosis not present

## 2023-04-10 DIAGNOSIS — I951 Orthostatic hypotension: Secondary | ICD-10-CM | POA: Diagnosis not present

## 2023-04-11 DIAGNOSIS — I110 Hypertensive heart disease with heart failure: Secondary | ICD-10-CM | POA: Diagnosis not present

## 2023-04-11 DIAGNOSIS — E119 Type 2 diabetes mellitus without complications: Secondary | ICD-10-CM | POA: Diagnosis not present

## 2023-04-11 DIAGNOSIS — G20A1 Parkinson's disease without dyskinesia, without mention of fluctuations: Secondary | ICD-10-CM | POA: Diagnosis not present

## 2023-04-11 DIAGNOSIS — R630 Anorexia: Secondary | ICD-10-CM | POA: Diagnosis not present

## 2023-04-11 DIAGNOSIS — G8194 Hemiplegia, unspecified affecting left nondominant side: Secondary | ICD-10-CM | POA: Diagnosis not present

## 2023-04-11 DIAGNOSIS — I951 Orthostatic hypotension: Secondary | ICD-10-CM | POA: Diagnosis not present

## 2023-04-12 DIAGNOSIS — G20A1 Parkinson's disease without dyskinesia, without mention of fluctuations: Secondary | ICD-10-CM | POA: Diagnosis not present

## 2023-04-12 DIAGNOSIS — R630 Anorexia: Secondary | ICD-10-CM | POA: Diagnosis not present

## 2023-04-12 DIAGNOSIS — I951 Orthostatic hypotension: Secondary | ICD-10-CM | POA: Diagnosis not present

## 2023-04-12 DIAGNOSIS — G8194 Hemiplegia, unspecified affecting left nondominant side: Secondary | ICD-10-CM | POA: Diagnosis not present

## 2023-04-12 DIAGNOSIS — I110 Hypertensive heart disease with heart failure: Secondary | ICD-10-CM | POA: Diagnosis not present

## 2023-04-12 DIAGNOSIS — E119 Type 2 diabetes mellitus without complications: Secondary | ICD-10-CM | POA: Diagnosis not present

## 2023-04-13 DIAGNOSIS — R630 Anorexia: Secondary | ICD-10-CM | POA: Diagnosis not present

## 2023-04-13 DIAGNOSIS — G8194 Hemiplegia, unspecified affecting left nondominant side: Secondary | ICD-10-CM | POA: Diagnosis not present

## 2023-04-13 DIAGNOSIS — G20A1 Parkinson's disease without dyskinesia, without mention of fluctuations: Secondary | ICD-10-CM | POA: Diagnosis not present

## 2023-04-13 DIAGNOSIS — I110 Hypertensive heart disease with heart failure: Secondary | ICD-10-CM | POA: Diagnosis not present

## 2023-04-13 DIAGNOSIS — I951 Orthostatic hypotension: Secondary | ICD-10-CM | POA: Diagnosis not present

## 2023-04-13 DIAGNOSIS — E119 Type 2 diabetes mellitus without complications: Secondary | ICD-10-CM | POA: Diagnosis not present

## 2023-04-14 DIAGNOSIS — I110 Hypertensive heart disease with heart failure: Secondary | ICD-10-CM | POA: Diagnosis not present

## 2023-04-14 DIAGNOSIS — E119 Type 2 diabetes mellitus without complications: Secondary | ICD-10-CM | POA: Diagnosis not present

## 2023-04-14 DIAGNOSIS — G20A1 Parkinson's disease without dyskinesia, without mention of fluctuations: Secondary | ICD-10-CM | POA: Diagnosis not present

## 2023-04-14 DIAGNOSIS — I951 Orthostatic hypotension: Secondary | ICD-10-CM | POA: Diagnosis not present

## 2023-04-14 DIAGNOSIS — G8194 Hemiplegia, unspecified affecting left nondominant side: Secondary | ICD-10-CM | POA: Diagnosis not present

## 2023-04-14 DIAGNOSIS — R630 Anorexia: Secondary | ICD-10-CM | POA: Diagnosis not present

## 2023-04-15 DIAGNOSIS — E119 Type 2 diabetes mellitus without complications: Secondary | ICD-10-CM | POA: Diagnosis not present

## 2023-04-15 DIAGNOSIS — I110 Hypertensive heart disease with heart failure: Secondary | ICD-10-CM | POA: Diagnosis not present

## 2023-04-15 DIAGNOSIS — G8194 Hemiplegia, unspecified affecting left nondominant side: Secondary | ICD-10-CM | POA: Diagnosis not present

## 2023-04-15 DIAGNOSIS — I951 Orthostatic hypotension: Secondary | ICD-10-CM | POA: Diagnosis not present

## 2023-04-15 DIAGNOSIS — G20A1 Parkinson's disease without dyskinesia, without mention of fluctuations: Secondary | ICD-10-CM | POA: Diagnosis not present

## 2023-04-15 DIAGNOSIS — R630 Anorexia: Secondary | ICD-10-CM | POA: Diagnosis not present

## 2023-04-16 DIAGNOSIS — E119 Type 2 diabetes mellitus without complications: Secondary | ICD-10-CM | POA: Diagnosis not present

## 2023-04-16 DIAGNOSIS — G20A1 Parkinson's disease without dyskinesia, without mention of fluctuations: Secondary | ICD-10-CM | POA: Diagnosis not present

## 2023-04-16 DIAGNOSIS — I951 Orthostatic hypotension: Secondary | ICD-10-CM | POA: Diagnosis not present

## 2023-04-16 DIAGNOSIS — R630 Anorexia: Secondary | ICD-10-CM | POA: Diagnosis not present

## 2023-04-16 DIAGNOSIS — G8194 Hemiplegia, unspecified affecting left nondominant side: Secondary | ICD-10-CM | POA: Diagnosis not present

## 2023-04-16 DIAGNOSIS — I110 Hypertensive heart disease with heart failure: Secondary | ICD-10-CM | POA: Diagnosis not present

## 2023-04-17 DIAGNOSIS — G8194 Hemiplegia, unspecified affecting left nondominant side: Secondary | ICD-10-CM | POA: Diagnosis not present

## 2023-04-17 DIAGNOSIS — E119 Type 2 diabetes mellitus without complications: Secondary | ICD-10-CM | POA: Diagnosis not present

## 2023-04-17 DIAGNOSIS — G20A1 Parkinson's disease without dyskinesia, without mention of fluctuations: Secondary | ICD-10-CM | POA: Diagnosis not present

## 2023-04-17 DIAGNOSIS — R630 Anorexia: Secondary | ICD-10-CM | POA: Diagnosis not present

## 2023-04-17 DIAGNOSIS — I110 Hypertensive heart disease with heart failure: Secondary | ICD-10-CM | POA: Diagnosis not present

## 2023-04-17 DIAGNOSIS — I951 Orthostatic hypotension: Secondary | ICD-10-CM | POA: Diagnosis not present

## 2023-04-18 DIAGNOSIS — I110 Hypertensive heart disease with heart failure: Secondary | ICD-10-CM | POA: Diagnosis not present

## 2023-04-18 DIAGNOSIS — R630 Anorexia: Secondary | ICD-10-CM | POA: Diagnosis not present

## 2023-04-18 DIAGNOSIS — I951 Orthostatic hypotension: Secondary | ICD-10-CM | POA: Diagnosis not present

## 2023-04-18 DIAGNOSIS — G20A1 Parkinson's disease without dyskinesia, without mention of fluctuations: Secondary | ICD-10-CM | POA: Diagnosis not present

## 2023-04-18 DIAGNOSIS — E119 Type 2 diabetes mellitus without complications: Secondary | ICD-10-CM | POA: Diagnosis not present

## 2023-04-18 DIAGNOSIS — G8194 Hemiplegia, unspecified affecting left nondominant side: Secondary | ICD-10-CM | POA: Diagnosis not present

## 2023-04-19 DIAGNOSIS — R630 Anorexia: Secondary | ICD-10-CM | POA: Diagnosis not present

## 2023-04-19 DIAGNOSIS — G8194 Hemiplegia, unspecified affecting left nondominant side: Secondary | ICD-10-CM | POA: Diagnosis not present

## 2023-04-19 DIAGNOSIS — E119 Type 2 diabetes mellitus without complications: Secondary | ICD-10-CM | POA: Diagnosis not present

## 2023-04-19 DIAGNOSIS — I951 Orthostatic hypotension: Secondary | ICD-10-CM | POA: Diagnosis not present

## 2023-04-19 DIAGNOSIS — G20A1 Parkinson's disease without dyskinesia, without mention of fluctuations: Secondary | ICD-10-CM | POA: Diagnosis not present

## 2023-04-19 DIAGNOSIS — I110 Hypertensive heart disease with heart failure: Secondary | ICD-10-CM | POA: Diagnosis not present

## 2023-04-20 DIAGNOSIS — I110 Hypertensive heart disease with heart failure: Secondary | ICD-10-CM | POA: Diagnosis not present

## 2023-04-20 DIAGNOSIS — I951 Orthostatic hypotension: Secondary | ICD-10-CM | POA: Diagnosis not present

## 2023-04-20 DIAGNOSIS — R630 Anorexia: Secondary | ICD-10-CM | POA: Diagnosis not present

## 2023-04-20 DIAGNOSIS — G20A1 Parkinson's disease without dyskinesia, without mention of fluctuations: Secondary | ICD-10-CM | POA: Diagnosis not present

## 2023-04-20 DIAGNOSIS — E119 Type 2 diabetes mellitus without complications: Secondary | ICD-10-CM | POA: Diagnosis not present

## 2023-04-20 DIAGNOSIS — G8194 Hemiplegia, unspecified affecting left nondominant side: Secondary | ICD-10-CM | POA: Diagnosis not present

## 2023-04-21 DIAGNOSIS — G20A1 Parkinson's disease without dyskinesia, without mention of fluctuations: Secondary | ICD-10-CM | POA: Diagnosis not present

## 2023-04-21 DIAGNOSIS — E119 Type 2 diabetes mellitus without complications: Secondary | ICD-10-CM | POA: Diagnosis not present

## 2023-04-21 DIAGNOSIS — I951 Orthostatic hypotension: Secondary | ICD-10-CM | POA: Diagnosis not present

## 2023-04-21 DIAGNOSIS — G8194 Hemiplegia, unspecified affecting left nondominant side: Secondary | ICD-10-CM | POA: Diagnosis not present

## 2023-04-21 DIAGNOSIS — I110 Hypertensive heart disease with heart failure: Secondary | ICD-10-CM | POA: Diagnosis not present

## 2023-04-21 DIAGNOSIS — R630 Anorexia: Secondary | ICD-10-CM | POA: Diagnosis not present

## 2023-04-22 DIAGNOSIS — G20A1 Parkinson's disease without dyskinesia, without mention of fluctuations: Secondary | ICD-10-CM | POA: Diagnosis not present

## 2023-04-22 DIAGNOSIS — I951 Orthostatic hypotension: Secondary | ICD-10-CM | POA: Diagnosis not present

## 2023-04-22 DIAGNOSIS — G8194 Hemiplegia, unspecified affecting left nondominant side: Secondary | ICD-10-CM | POA: Diagnosis not present

## 2023-04-22 DIAGNOSIS — I110 Hypertensive heart disease with heart failure: Secondary | ICD-10-CM | POA: Diagnosis not present

## 2023-04-22 DIAGNOSIS — E119 Type 2 diabetes mellitus without complications: Secondary | ICD-10-CM | POA: Diagnosis not present

## 2023-04-22 DIAGNOSIS — R630 Anorexia: Secondary | ICD-10-CM | POA: Diagnosis not present

## 2023-04-23 DIAGNOSIS — G8194 Hemiplegia, unspecified affecting left nondominant side: Secondary | ICD-10-CM | POA: Diagnosis not present

## 2023-04-23 DIAGNOSIS — I110 Hypertensive heart disease with heart failure: Secondary | ICD-10-CM | POA: Diagnosis not present

## 2023-04-23 DIAGNOSIS — E119 Type 2 diabetes mellitus without complications: Secondary | ICD-10-CM | POA: Diagnosis not present

## 2023-04-23 DIAGNOSIS — R630 Anorexia: Secondary | ICD-10-CM | POA: Diagnosis not present

## 2023-04-23 DIAGNOSIS — G20A1 Parkinson's disease without dyskinesia, without mention of fluctuations: Secondary | ICD-10-CM | POA: Diagnosis not present

## 2023-04-23 DIAGNOSIS — I951 Orthostatic hypotension: Secondary | ICD-10-CM | POA: Diagnosis not present

## 2023-04-24 DIAGNOSIS — G20A1 Parkinson's disease without dyskinesia, without mention of fluctuations: Secondary | ICD-10-CM | POA: Diagnosis not present

## 2023-04-24 DIAGNOSIS — I110 Hypertensive heart disease with heart failure: Secondary | ICD-10-CM | POA: Diagnosis not present

## 2023-04-24 DIAGNOSIS — E119 Type 2 diabetes mellitus without complications: Secondary | ICD-10-CM | POA: Diagnosis not present

## 2023-04-24 DIAGNOSIS — R630 Anorexia: Secondary | ICD-10-CM | POA: Diagnosis not present

## 2023-04-24 DIAGNOSIS — G8194 Hemiplegia, unspecified affecting left nondominant side: Secondary | ICD-10-CM | POA: Diagnosis not present

## 2023-04-24 DIAGNOSIS — I951 Orthostatic hypotension: Secondary | ICD-10-CM | POA: Diagnosis not present

## 2023-04-25 DIAGNOSIS — I110 Hypertensive heart disease with heart failure: Secondary | ICD-10-CM | POA: Diagnosis not present

## 2023-04-25 DIAGNOSIS — E119 Type 2 diabetes mellitus without complications: Secondary | ICD-10-CM | POA: Diagnosis not present

## 2023-04-25 DIAGNOSIS — R630 Anorexia: Secondary | ICD-10-CM | POA: Diagnosis not present

## 2023-04-25 DIAGNOSIS — G20A1 Parkinson's disease without dyskinesia, without mention of fluctuations: Secondary | ICD-10-CM | POA: Diagnosis not present

## 2023-04-25 DIAGNOSIS — I951 Orthostatic hypotension: Secondary | ICD-10-CM | POA: Diagnosis not present

## 2023-04-25 DIAGNOSIS — G8194 Hemiplegia, unspecified affecting left nondominant side: Secondary | ICD-10-CM | POA: Diagnosis not present

## 2023-04-26 DIAGNOSIS — G20A1 Parkinson's disease without dyskinesia, without mention of fluctuations: Secondary | ICD-10-CM | POA: Diagnosis not present

## 2023-04-26 DIAGNOSIS — R630 Anorexia: Secondary | ICD-10-CM | POA: Diagnosis not present

## 2023-04-26 DIAGNOSIS — E119 Type 2 diabetes mellitus without complications: Secondary | ICD-10-CM | POA: Diagnosis not present

## 2023-04-26 DIAGNOSIS — G8194 Hemiplegia, unspecified affecting left nondominant side: Secondary | ICD-10-CM | POA: Diagnosis not present

## 2023-04-26 DIAGNOSIS — I110 Hypertensive heart disease with heart failure: Secondary | ICD-10-CM | POA: Diagnosis not present

## 2023-04-26 DIAGNOSIS — I951 Orthostatic hypotension: Secondary | ICD-10-CM | POA: Diagnosis not present

## 2023-04-27 DIAGNOSIS — G8194 Hemiplegia, unspecified affecting left nondominant side: Secondary | ICD-10-CM | POA: Diagnosis not present

## 2023-04-27 DIAGNOSIS — R630 Anorexia: Secondary | ICD-10-CM | POA: Diagnosis not present

## 2023-04-27 DIAGNOSIS — I951 Orthostatic hypotension: Secondary | ICD-10-CM | POA: Diagnosis not present

## 2023-04-27 DIAGNOSIS — G20A1 Parkinson's disease without dyskinesia, without mention of fluctuations: Secondary | ICD-10-CM | POA: Diagnosis not present

## 2023-04-27 DIAGNOSIS — E119 Type 2 diabetes mellitus without complications: Secondary | ICD-10-CM | POA: Diagnosis not present

## 2023-04-27 DIAGNOSIS — I110 Hypertensive heart disease with heart failure: Secondary | ICD-10-CM | POA: Diagnosis not present

## 2023-04-28 DIAGNOSIS — G20A1 Parkinson's disease without dyskinesia, without mention of fluctuations: Secondary | ICD-10-CM | POA: Diagnosis not present

## 2023-04-28 DIAGNOSIS — E119 Type 2 diabetes mellitus without complications: Secondary | ICD-10-CM | POA: Diagnosis not present

## 2023-04-28 DIAGNOSIS — I110 Hypertensive heart disease with heart failure: Secondary | ICD-10-CM | POA: Diagnosis not present

## 2023-04-28 DIAGNOSIS — I951 Orthostatic hypotension: Secondary | ICD-10-CM | POA: Diagnosis not present

## 2023-04-28 DIAGNOSIS — R630 Anorexia: Secondary | ICD-10-CM | POA: Diagnosis not present

## 2023-04-28 DIAGNOSIS — G8194 Hemiplegia, unspecified affecting left nondominant side: Secondary | ICD-10-CM | POA: Diagnosis not present

## 2023-04-29 DIAGNOSIS — G20A1 Parkinson's disease without dyskinesia, without mention of fluctuations: Secondary | ICD-10-CM | POA: Diagnosis not present

## 2023-04-29 DIAGNOSIS — E119 Type 2 diabetes mellitus without complications: Secondary | ICD-10-CM | POA: Diagnosis not present

## 2023-04-29 DIAGNOSIS — I110 Hypertensive heart disease with heart failure: Secondary | ICD-10-CM | POA: Diagnosis not present

## 2023-04-29 DIAGNOSIS — R630 Anorexia: Secondary | ICD-10-CM | POA: Diagnosis not present

## 2023-04-29 DIAGNOSIS — G8194 Hemiplegia, unspecified affecting left nondominant side: Secondary | ICD-10-CM | POA: Diagnosis not present

## 2023-04-29 DIAGNOSIS — I951 Orthostatic hypotension: Secondary | ICD-10-CM | POA: Diagnosis not present

## 2023-04-30 DIAGNOSIS — R630 Anorexia: Secondary | ICD-10-CM | POA: Diagnosis not present

## 2023-04-30 DIAGNOSIS — E119 Type 2 diabetes mellitus without complications: Secondary | ICD-10-CM | POA: Diagnosis not present

## 2023-04-30 DIAGNOSIS — I110 Hypertensive heart disease with heart failure: Secondary | ICD-10-CM | POA: Diagnosis not present

## 2023-04-30 DIAGNOSIS — G20A1 Parkinson's disease without dyskinesia, without mention of fluctuations: Secondary | ICD-10-CM | POA: Diagnosis not present

## 2023-04-30 DIAGNOSIS — G8194 Hemiplegia, unspecified affecting left nondominant side: Secondary | ICD-10-CM | POA: Diagnosis not present

## 2023-04-30 DIAGNOSIS — I951 Orthostatic hypotension: Secondary | ICD-10-CM | POA: Diagnosis not present

## 2023-05-01 DIAGNOSIS — I110 Hypertensive heart disease with heart failure: Secondary | ICD-10-CM | POA: Diagnosis not present

## 2023-05-01 DIAGNOSIS — G20A1 Parkinson's disease without dyskinesia, without mention of fluctuations: Secondary | ICD-10-CM | POA: Diagnosis not present

## 2023-05-01 DIAGNOSIS — G8194 Hemiplegia, unspecified affecting left nondominant side: Secondary | ICD-10-CM | POA: Diagnosis not present

## 2023-05-01 DIAGNOSIS — R630 Anorexia: Secondary | ICD-10-CM | POA: Diagnosis not present

## 2023-05-01 DIAGNOSIS — E119 Type 2 diabetes mellitus without complications: Secondary | ICD-10-CM | POA: Diagnosis not present

## 2023-05-01 DIAGNOSIS — I951 Orthostatic hypotension: Secondary | ICD-10-CM | POA: Diagnosis not present

## 2023-05-02 DIAGNOSIS — I951 Orthostatic hypotension: Secondary | ICD-10-CM | POA: Diagnosis not present

## 2023-05-02 DIAGNOSIS — G8194 Hemiplegia, unspecified affecting left nondominant side: Secondary | ICD-10-CM | POA: Diagnosis not present

## 2023-05-02 DIAGNOSIS — R630 Anorexia: Secondary | ICD-10-CM | POA: Diagnosis not present

## 2023-05-02 DIAGNOSIS — M479 Spondylosis, unspecified: Secondary | ICD-10-CM | POA: Diagnosis not present

## 2023-05-02 DIAGNOSIS — R000 Tachycardia, unspecified: Secondary | ICD-10-CM | POA: Diagnosis not present

## 2023-05-02 DIAGNOSIS — I509 Heart failure, unspecified: Secondary | ICD-10-CM | POA: Diagnosis not present

## 2023-05-02 DIAGNOSIS — E785 Hyperlipidemia, unspecified: Secondary | ICD-10-CM | POA: Diagnosis not present

## 2023-05-02 DIAGNOSIS — I428 Other cardiomyopathies: Secondary | ICD-10-CM | POA: Diagnosis not present

## 2023-05-02 DIAGNOSIS — K219 Gastro-esophageal reflux disease without esophagitis: Secondary | ICD-10-CM | POA: Diagnosis not present

## 2023-05-02 DIAGNOSIS — I110 Hypertensive heart disease with heart failure: Secondary | ICD-10-CM | POA: Diagnosis not present

## 2023-05-02 DIAGNOSIS — E119 Type 2 diabetes mellitus without complications: Secondary | ICD-10-CM | POA: Diagnosis not present

## 2023-05-02 DIAGNOSIS — G20A1 Parkinson's disease without dyskinesia, without mention of fluctuations: Secondary | ICD-10-CM | POA: Diagnosis not present

## 2023-05-03 DIAGNOSIS — G8194 Hemiplegia, unspecified affecting left nondominant side: Secondary | ICD-10-CM | POA: Diagnosis not present

## 2023-05-03 DIAGNOSIS — R630 Anorexia: Secondary | ICD-10-CM | POA: Diagnosis not present

## 2023-05-03 DIAGNOSIS — E119 Type 2 diabetes mellitus without complications: Secondary | ICD-10-CM | POA: Diagnosis not present

## 2023-05-03 DIAGNOSIS — G20A1 Parkinson's disease without dyskinesia, without mention of fluctuations: Secondary | ICD-10-CM | POA: Diagnosis not present

## 2023-05-03 DIAGNOSIS — I110 Hypertensive heart disease with heart failure: Secondary | ICD-10-CM | POA: Diagnosis not present

## 2023-05-03 DIAGNOSIS — I951 Orthostatic hypotension: Secondary | ICD-10-CM | POA: Diagnosis not present

## 2023-05-04 DIAGNOSIS — G20A1 Parkinson's disease without dyskinesia, without mention of fluctuations: Secondary | ICD-10-CM | POA: Diagnosis not present

## 2023-05-04 DIAGNOSIS — R630 Anorexia: Secondary | ICD-10-CM | POA: Diagnosis not present

## 2023-05-04 DIAGNOSIS — G8194 Hemiplegia, unspecified affecting left nondominant side: Secondary | ICD-10-CM | POA: Diagnosis not present

## 2023-05-04 DIAGNOSIS — I951 Orthostatic hypotension: Secondary | ICD-10-CM | POA: Diagnosis not present

## 2023-05-04 DIAGNOSIS — I110 Hypertensive heart disease with heart failure: Secondary | ICD-10-CM | POA: Diagnosis not present

## 2023-05-04 DIAGNOSIS — E119 Type 2 diabetes mellitus without complications: Secondary | ICD-10-CM | POA: Diagnosis not present

## 2023-05-05 DIAGNOSIS — G20A1 Parkinson's disease without dyskinesia, without mention of fluctuations: Secondary | ICD-10-CM | POA: Diagnosis not present

## 2023-05-05 DIAGNOSIS — R630 Anorexia: Secondary | ICD-10-CM | POA: Diagnosis not present

## 2023-05-05 DIAGNOSIS — I110 Hypertensive heart disease with heart failure: Secondary | ICD-10-CM | POA: Diagnosis not present

## 2023-05-05 DIAGNOSIS — G8194 Hemiplegia, unspecified affecting left nondominant side: Secondary | ICD-10-CM | POA: Diagnosis not present

## 2023-05-05 DIAGNOSIS — I951 Orthostatic hypotension: Secondary | ICD-10-CM | POA: Diagnosis not present

## 2023-05-05 DIAGNOSIS — E119 Type 2 diabetes mellitus without complications: Secondary | ICD-10-CM | POA: Diagnosis not present

## 2023-05-06 DIAGNOSIS — I951 Orthostatic hypotension: Secondary | ICD-10-CM | POA: Diagnosis not present

## 2023-05-06 DIAGNOSIS — G8194 Hemiplegia, unspecified affecting left nondominant side: Secondary | ICD-10-CM | POA: Diagnosis not present

## 2023-05-06 DIAGNOSIS — G20A1 Parkinson's disease without dyskinesia, without mention of fluctuations: Secondary | ICD-10-CM | POA: Diagnosis not present

## 2023-05-06 DIAGNOSIS — R630 Anorexia: Secondary | ICD-10-CM | POA: Diagnosis not present

## 2023-05-06 DIAGNOSIS — I110 Hypertensive heart disease with heart failure: Secondary | ICD-10-CM | POA: Diagnosis not present

## 2023-05-06 DIAGNOSIS — E119 Type 2 diabetes mellitus without complications: Secondary | ICD-10-CM | POA: Diagnosis not present

## 2023-05-07 DIAGNOSIS — R630 Anorexia: Secondary | ICD-10-CM | POA: Diagnosis not present

## 2023-05-07 DIAGNOSIS — E119 Type 2 diabetes mellitus without complications: Secondary | ICD-10-CM | POA: Diagnosis not present

## 2023-05-07 DIAGNOSIS — I110 Hypertensive heart disease with heart failure: Secondary | ICD-10-CM | POA: Diagnosis not present

## 2023-05-07 DIAGNOSIS — G20A1 Parkinson's disease without dyskinesia, without mention of fluctuations: Secondary | ICD-10-CM | POA: Diagnosis not present

## 2023-05-07 DIAGNOSIS — G8194 Hemiplegia, unspecified affecting left nondominant side: Secondary | ICD-10-CM | POA: Diagnosis not present

## 2023-05-07 DIAGNOSIS — I951 Orthostatic hypotension: Secondary | ICD-10-CM | POA: Diagnosis not present

## 2023-05-08 DIAGNOSIS — I110 Hypertensive heart disease with heart failure: Secondary | ICD-10-CM | POA: Diagnosis not present

## 2023-05-08 DIAGNOSIS — I951 Orthostatic hypotension: Secondary | ICD-10-CM | POA: Diagnosis not present

## 2023-05-08 DIAGNOSIS — G8194 Hemiplegia, unspecified affecting left nondominant side: Secondary | ICD-10-CM | POA: Diagnosis not present

## 2023-05-08 DIAGNOSIS — G20A1 Parkinson's disease without dyskinesia, without mention of fluctuations: Secondary | ICD-10-CM | POA: Diagnosis not present

## 2023-05-08 DIAGNOSIS — E119 Type 2 diabetes mellitus without complications: Secondary | ICD-10-CM | POA: Diagnosis not present

## 2023-05-08 DIAGNOSIS — R630 Anorexia: Secondary | ICD-10-CM | POA: Diagnosis not present

## 2023-05-09 DIAGNOSIS — R630 Anorexia: Secondary | ICD-10-CM | POA: Diagnosis not present

## 2023-05-09 DIAGNOSIS — G8194 Hemiplegia, unspecified affecting left nondominant side: Secondary | ICD-10-CM | POA: Diagnosis not present

## 2023-05-09 DIAGNOSIS — E119 Type 2 diabetes mellitus without complications: Secondary | ICD-10-CM | POA: Diagnosis not present

## 2023-05-09 DIAGNOSIS — I110 Hypertensive heart disease with heart failure: Secondary | ICD-10-CM | POA: Diagnosis not present

## 2023-05-09 DIAGNOSIS — G20A1 Parkinson's disease without dyskinesia, without mention of fluctuations: Secondary | ICD-10-CM | POA: Diagnosis not present

## 2023-05-09 DIAGNOSIS — I951 Orthostatic hypotension: Secondary | ICD-10-CM | POA: Diagnosis not present

## 2023-05-10 DIAGNOSIS — R630 Anorexia: Secondary | ICD-10-CM | POA: Diagnosis not present

## 2023-05-10 DIAGNOSIS — G8194 Hemiplegia, unspecified affecting left nondominant side: Secondary | ICD-10-CM | POA: Diagnosis not present

## 2023-05-10 DIAGNOSIS — I951 Orthostatic hypotension: Secondary | ICD-10-CM | POA: Diagnosis not present

## 2023-05-10 DIAGNOSIS — G20A1 Parkinson's disease without dyskinesia, without mention of fluctuations: Secondary | ICD-10-CM | POA: Diagnosis not present

## 2023-05-10 DIAGNOSIS — E119 Type 2 diabetes mellitus without complications: Secondary | ICD-10-CM | POA: Diagnosis not present

## 2023-05-10 DIAGNOSIS — I110 Hypertensive heart disease with heart failure: Secondary | ICD-10-CM | POA: Diagnosis not present

## 2023-05-11 DIAGNOSIS — R630 Anorexia: Secondary | ICD-10-CM | POA: Diagnosis not present

## 2023-05-11 DIAGNOSIS — I951 Orthostatic hypotension: Secondary | ICD-10-CM | POA: Diagnosis not present

## 2023-05-11 DIAGNOSIS — I110 Hypertensive heart disease with heart failure: Secondary | ICD-10-CM | POA: Diagnosis not present

## 2023-05-11 DIAGNOSIS — G20A1 Parkinson's disease without dyskinesia, without mention of fluctuations: Secondary | ICD-10-CM | POA: Diagnosis not present

## 2023-05-11 DIAGNOSIS — G8194 Hemiplegia, unspecified affecting left nondominant side: Secondary | ICD-10-CM | POA: Diagnosis not present

## 2023-05-11 DIAGNOSIS — E119 Type 2 diabetes mellitus without complications: Secondary | ICD-10-CM | POA: Diagnosis not present

## 2023-05-12 DIAGNOSIS — G20A1 Parkinson's disease without dyskinesia, without mention of fluctuations: Secondary | ICD-10-CM | POA: Diagnosis not present

## 2023-05-12 DIAGNOSIS — R630 Anorexia: Secondary | ICD-10-CM | POA: Diagnosis not present

## 2023-05-12 DIAGNOSIS — G8194 Hemiplegia, unspecified affecting left nondominant side: Secondary | ICD-10-CM | POA: Diagnosis not present

## 2023-05-12 DIAGNOSIS — I951 Orthostatic hypotension: Secondary | ICD-10-CM | POA: Diagnosis not present

## 2023-05-12 DIAGNOSIS — I110 Hypertensive heart disease with heart failure: Secondary | ICD-10-CM | POA: Diagnosis not present

## 2023-05-12 DIAGNOSIS — E119 Type 2 diabetes mellitus without complications: Secondary | ICD-10-CM | POA: Diagnosis not present

## 2023-05-13 DIAGNOSIS — G8194 Hemiplegia, unspecified affecting left nondominant side: Secondary | ICD-10-CM | POA: Diagnosis not present

## 2023-05-13 DIAGNOSIS — I110 Hypertensive heart disease with heart failure: Secondary | ICD-10-CM | POA: Diagnosis not present

## 2023-05-13 DIAGNOSIS — I951 Orthostatic hypotension: Secondary | ICD-10-CM | POA: Diagnosis not present

## 2023-05-13 DIAGNOSIS — G20A1 Parkinson's disease without dyskinesia, without mention of fluctuations: Secondary | ICD-10-CM | POA: Diagnosis not present

## 2023-05-13 DIAGNOSIS — E119 Type 2 diabetes mellitus without complications: Secondary | ICD-10-CM | POA: Diagnosis not present

## 2023-05-13 DIAGNOSIS — R630 Anorexia: Secondary | ICD-10-CM | POA: Diagnosis not present

## 2023-05-14 DIAGNOSIS — I110 Hypertensive heart disease with heart failure: Secondary | ICD-10-CM | POA: Diagnosis not present

## 2023-05-14 DIAGNOSIS — G8194 Hemiplegia, unspecified affecting left nondominant side: Secondary | ICD-10-CM | POA: Diagnosis not present

## 2023-05-14 DIAGNOSIS — I951 Orthostatic hypotension: Secondary | ICD-10-CM | POA: Diagnosis not present

## 2023-05-14 DIAGNOSIS — G20A1 Parkinson's disease without dyskinesia, without mention of fluctuations: Secondary | ICD-10-CM | POA: Diagnosis not present

## 2023-05-14 DIAGNOSIS — E119 Type 2 diabetes mellitus without complications: Secondary | ICD-10-CM | POA: Diagnosis not present

## 2023-05-14 DIAGNOSIS — R630 Anorexia: Secondary | ICD-10-CM | POA: Diagnosis not present

## 2023-05-15 DIAGNOSIS — G8194 Hemiplegia, unspecified affecting left nondominant side: Secondary | ICD-10-CM | POA: Diagnosis not present

## 2023-05-15 DIAGNOSIS — E119 Type 2 diabetes mellitus without complications: Secondary | ICD-10-CM | POA: Diagnosis not present

## 2023-05-15 DIAGNOSIS — I110 Hypertensive heart disease with heart failure: Secondary | ICD-10-CM | POA: Diagnosis not present

## 2023-05-15 DIAGNOSIS — I951 Orthostatic hypotension: Secondary | ICD-10-CM | POA: Diagnosis not present

## 2023-05-15 DIAGNOSIS — R630 Anorexia: Secondary | ICD-10-CM | POA: Diagnosis not present

## 2023-05-15 DIAGNOSIS — G20A1 Parkinson's disease without dyskinesia, without mention of fluctuations: Secondary | ICD-10-CM | POA: Diagnosis not present

## 2023-05-16 DIAGNOSIS — G20A1 Parkinson's disease without dyskinesia, without mention of fluctuations: Secondary | ICD-10-CM | POA: Diagnosis not present

## 2023-05-16 DIAGNOSIS — G8194 Hemiplegia, unspecified affecting left nondominant side: Secondary | ICD-10-CM | POA: Diagnosis not present

## 2023-05-16 DIAGNOSIS — I951 Orthostatic hypotension: Secondary | ICD-10-CM | POA: Diagnosis not present

## 2023-05-16 DIAGNOSIS — E119 Type 2 diabetes mellitus without complications: Secondary | ICD-10-CM | POA: Diagnosis not present

## 2023-05-16 DIAGNOSIS — R630 Anorexia: Secondary | ICD-10-CM | POA: Diagnosis not present

## 2023-05-16 DIAGNOSIS — I110 Hypertensive heart disease with heart failure: Secondary | ICD-10-CM | POA: Diagnosis not present

## 2023-05-17 DIAGNOSIS — R630 Anorexia: Secondary | ICD-10-CM | POA: Diagnosis not present

## 2023-05-17 DIAGNOSIS — G8194 Hemiplegia, unspecified affecting left nondominant side: Secondary | ICD-10-CM | POA: Diagnosis not present

## 2023-05-17 DIAGNOSIS — I951 Orthostatic hypotension: Secondary | ICD-10-CM | POA: Diagnosis not present

## 2023-05-17 DIAGNOSIS — G20A1 Parkinson's disease without dyskinesia, without mention of fluctuations: Secondary | ICD-10-CM | POA: Diagnosis not present

## 2023-05-17 DIAGNOSIS — E119 Type 2 diabetes mellitus without complications: Secondary | ICD-10-CM | POA: Diagnosis not present

## 2023-05-17 DIAGNOSIS — I110 Hypertensive heart disease with heart failure: Secondary | ICD-10-CM | POA: Diagnosis not present

## 2023-05-18 DIAGNOSIS — I951 Orthostatic hypotension: Secondary | ICD-10-CM | POA: Diagnosis not present

## 2023-05-18 DIAGNOSIS — I110 Hypertensive heart disease with heart failure: Secondary | ICD-10-CM | POA: Diagnosis not present

## 2023-05-18 DIAGNOSIS — G20A1 Parkinson's disease without dyskinesia, without mention of fluctuations: Secondary | ICD-10-CM | POA: Diagnosis not present

## 2023-05-18 DIAGNOSIS — E119 Type 2 diabetes mellitus without complications: Secondary | ICD-10-CM | POA: Diagnosis not present

## 2023-05-18 DIAGNOSIS — R630 Anorexia: Secondary | ICD-10-CM | POA: Diagnosis not present

## 2023-05-18 DIAGNOSIS — G8194 Hemiplegia, unspecified affecting left nondominant side: Secondary | ICD-10-CM | POA: Diagnosis not present

## 2023-05-19 DIAGNOSIS — I110 Hypertensive heart disease with heart failure: Secondary | ICD-10-CM | POA: Diagnosis not present

## 2023-05-19 DIAGNOSIS — I951 Orthostatic hypotension: Secondary | ICD-10-CM | POA: Diagnosis not present

## 2023-05-19 DIAGNOSIS — G8194 Hemiplegia, unspecified affecting left nondominant side: Secondary | ICD-10-CM | POA: Diagnosis not present

## 2023-05-19 DIAGNOSIS — G20A1 Parkinson's disease without dyskinesia, without mention of fluctuations: Secondary | ICD-10-CM | POA: Diagnosis not present

## 2023-05-19 DIAGNOSIS — E119 Type 2 diabetes mellitus without complications: Secondary | ICD-10-CM | POA: Diagnosis not present

## 2023-05-19 DIAGNOSIS — R630 Anorexia: Secondary | ICD-10-CM | POA: Diagnosis not present

## 2023-05-20 DIAGNOSIS — I951 Orthostatic hypotension: Secondary | ICD-10-CM | POA: Diagnosis not present

## 2023-05-20 DIAGNOSIS — I110 Hypertensive heart disease with heart failure: Secondary | ICD-10-CM | POA: Diagnosis not present

## 2023-05-20 DIAGNOSIS — G20A1 Parkinson's disease without dyskinesia, without mention of fluctuations: Secondary | ICD-10-CM | POA: Diagnosis not present

## 2023-05-20 DIAGNOSIS — E119 Type 2 diabetes mellitus without complications: Secondary | ICD-10-CM | POA: Diagnosis not present

## 2023-05-20 DIAGNOSIS — G8194 Hemiplegia, unspecified affecting left nondominant side: Secondary | ICD-10-CM | POA: Diagnosis not present

## 2023-05-20 DIAGNOSIS — R630 Anorexia: Secondary | ICD-10-CM | POA: Diagnosis not present

## 2023-05-21 DIAGNOSIS — G20A1 Parkinson's disease without dyskinesia, without mention of fluctuations: Secondary | ICD-10-CM | POA: Diagnosis not present

## 2023-05-21 DIAGNOSIS — I951 Orthostatic hypotension: Secondary | ICD-10-CM | POA: Diagnosis not present

## 2023-05-21 DIAGNOSIS — G8194 Hemiplegia, unspecified affecting left nondominant side: Secondary | ICD-10-CM | POA: Diagnosis not present

## 2023-05-21 DIAGNOSIS — I110 Hypertensive heart disease with heart failure: Secondary | ICD-10-CM | POA: Diagnosis not present

## 2023-05-21 DIAGNOSIS — R630 Anorexia: Secondary | ICD-10-CM | POA: Diagnosis not present

## 2023-05-21 DIAGNOSIS — E119 Type 2 diabetes mellitus without complications: Secondary | ICD-10-CM | POA: Diagnosis not present

## 2023-05-22 DIAGNOSIS — G8194 Hemiplegia, unspecified affecting left nondominant side: Secondary | ICD-10-CM | POA: Diagnosis not present

## 2023-05-22 DIAGNOSIS — R630 Anorexia: Secondary | ICD-10-CM | POA: Diagnosis not present

## 2023-05-22 DIAGNOSIS — E119 Type 2 diabetes mellitus without complications: Secondary | ICD-10-CM | POA: Diagnosis not present

## 2023-05-22 DIAGNOSIS — I951 Orthostatic hypotension: Secondary | ICD-10-CM | POA: Diagnosis not present

## 2023-05-22 DIAGNOSIS — G20A1 Parkinson's disease without dyskinesia, without mention of fluctuations: Secondary | ICD-10-CM | POA: Diagnosis not present

## 2023-05-22 DIAGNOSIS — I110 Hypertensive heart disease with heart failure: Secondary | ICD-10-CM | POA: Diagnosis not present

## 2023-05-23 DIAGNOSIS — I951 Orthostatic hypotension: Secondary | ICD-10-CM | POA: Diagnosis not present

## 2023-05-23 DIAGNOSIS — E119 Type 2 diabetes mellitus without complications: Secondary | ICD-10-CM | POA: Diagnosis not present

## 2023-05-23 DIAGNOSIS — I110 Hypertensive heart disease with heart failure: Secondary | ICD-10-CM | POA: Diagnosis not present

## 2023-05-23 DIAGNOSIS — G20A1 Parkinson's disease without dyskinesia, without mention of fluctuations: Secondary | ICD-10-CM | POA: Diagnosis not present

## 2023-05-23 DIAGNOSIS — R630 Anorexia: Secondary | ICD-10-CM | POA: Diagnosis not present

## 2023-05-23 DIAGNOSIS — G8194 Hemiplegia, unspecified affecting left nondominant side: Secondary | ICD-10-CM | POA: Diagnosis not present

## 2023-05-24 DIAGNOSIS — G20A1 Parkinson's disease without dyskinesia, without mention of fluctuations: Secondary | ICD-10-CM | POA: Diagnosis not present

## 2023-05-24 DIAGNOSIS — E119 Type 2 diabetes mellitus without complications: Secondary | ICD-10-CM | POA: Diagnosis not present

## 2023-05-24 DIAGNOSIS — R630 Anorexia: Secondary | ICD-10-CM | POA: Diagnosis not present

## 2023-05-24 DIAGNOSIS — I951 Orthostatic hypotension: Secondary | ICD-10-CM | POA: Diagnosis not present

## 2023-05-24 DIAGNOSIS — I110 Hypertensive heart disease with heart failure: Secondary | ICD-10-CM | POA: Diagnosis not present

## 2023-05-24 DIAGNOSIS — G8194 Hemiplegia, unspecified affecting left nondominant side: Secondary | ICD-10-CM | POA: Diagnosis not present

## 2023-05-25 ENCOUNTER — Ambulatory Visit (INDEPENDENT_AMBULATORY_CARE_PROVIDER_SITE_OTHER): Payer: Medicare Other

## 2023-05-25 DIAGNOSIS — R630 Anorexia: Secondary | ICD-10-CM | POA: Diagnosis not present

## 2023-05-25 DIAGNOSIS — G20A1 Parkinson's disease without dyskinesia, without mention of fluctuations: Secondary | ICD-10-CM | POA: Diagnosis not present

## 2023-05-25 DIAGNOSIS — E119 Type 2 diabetes mellitus without complications: Secondary | ICD-10-CM | POA: Diagnosis not present

## 2023-05-25 DIAGNOSIS — I428 Other cardiomyopathies: Secondary | ICD-10-CM

## 2023-05-25 DIAGNOSIS — I110 Hypertensive heart disease with heart failure: Secondary | ICD-10-CM | POA: Diagnosis not present

## 2023-05-25 DIAGNOSIS — G8194 Hemiplegia, unspecified affecting left nondominant side: Secondary | ICD-10-CM | POA: Diagnosis not present

## 2023-05-25 DIAGNOSIS — I951 Orthostatic hypotension: Secondary | ICD-10-CM | POA: Diagnosis not present

## 2023-05-25 LAB — CUP PACEART REMOTE DEVICE CHECK
Battery Remaining Longevity: 8 mo
Battery Voltage: 2.85 V
Brady Statistic AP VP Percent: 0 %
Brady Statistic AP VS Percent: 0 %
Brady Statistic AS VP Percent: 0.03 %
Brady Statistic AS VS Percent: 99.96 %
Brady Statistic RA Percent Paced: 0.01 %
Brady Statistic RV Percent Paced: 0.03 %
Date Time Interrogation Session: 20241024012404
HighPow Impedance: 68 Ohm
Implantable Lead Connection Status: 753985
Implantable Lead Connection Status: 753985
Implantable Lead Implant Date: 20150427
Implantable Lead Implant Date: 20150427
Implantable Lead Location: 753859
Implantable Lead Location: 753860
Implantable Lead Model: 5076
Implantable Lead Model: 6935
Implantable Pulse Generator Implant Date: 20150427
Lead Channel Impedance Value: 342 Ohm
Lead Channel Impedance Value: 361 Ohm
Lead Channel Impedance Value: 4047 Ohm
Lead Channel Impedance Value: 4047 Ohm
Lead Channel Impedance Value: 4047 Ohm
Lead Channel Impedance Value: 418 Ohm
Lead Channel Pacing Threshold Amplitude: 0.625 V
Lead Channel Pacing Threshold Amplitude: 0.75 V
Lead Channel Pacing Threshold Pulse Width: 0.4 ms
Lead Channel Pacing Threshold Pulse Width: 0.4 ms
Lead Channel Sensing Intrinsic Amplitude: 1.75 mV
Lead Channel Sensing Intrinsic Amplitude: 1.75 mV
Lead Channel Sensing Intrinsic Amplitude: 19.5 mV
Lead Channel Sensing Intrinsic Amplitude: 19.5 mV
Lead Channel Setting Pacing Amplitude: 2 V
Lead Channel Setting Pacing Amplitude: 2.5 V
Lead Channel Setting Pacing Pulse Width: 0.4 ms
Lead Channel Setting Sensing Sensitivity: 0.3 mV
Zone Setting Status: 755011
Zone Setting Status: 755011
Zone Setting Status: 755011

## 2023-05-26 DIAGNOSIS — I110 Hypertensive heart disease with heart failure: Secondary | ICD-10-CM | POA: Diagnosis not present

## 2023-05-26 DIAGNOSIS — G20A1 Parkinson's disease without dyskinesia, without mention of fluctuations: Secondary | ICD-10-CM | POA: Diagnosis not present

## 2023-05-26 DIAGNOSIS — I951 Orthostatic hypotension: Secondary | ICD-10-CM | POA: Diagnosis not present

## 2023-05-26 DIAGNOSIS — R630 Anorexia: Secondary | ICD-10-CM | POA: Diagnosis not present

## 2023-05-26 DIAGNOSIS — E119 Type 2 diabetes mellitus without complications: Secondary | ICD-10-CM | POA: Diagnosis not present

## 2023-05-26 DIAGNOSIS — G8194 Hemiplegia, unspecified affecting left nondominant side: Secondary | ICD-10-CM | POA: Diagnosis not present

## 2023-05-27 DIAGNOSIS — G20A1 Parkinson's disease without dyskinesia, without mention of fluctuations: Secondary | ICD-10-CM | POA: Diagnosis not present

## 2023-05-27 DIAGNOSIS — E119 Type 2 diabetes mellitus without complications: Secondary | ICD-10-CM | POA: Diagnosis not present

## 2023-05-27 DIAGNOSIS — G8194 Hemiplegia, unspecified affecting left nondominant side: Secondary | ICD-10-CM | POA: Diagnosis not present

## 2023-05-27 DIAGNOSIS — R630 Anorexia: Secondary | ICD-10-CM | POA: Diagnosis not present

## 2023-05-27 DIAGNOSIS — I951 Orthostatic hypotension: Secondary | ICD-10-CM | POA: Diagnosis not present

## 2023-05-27 DIAGNOSIS — I110 Hypertensive heart disease with heart failure: Secondary | ICD-10-CM | POA: Diagnosis not present

## 2023-05-28 DIAGNOSIS — G20A1 Parkinson's disease without dyskinesia, without mention of fluctuations: Secondary | ICD-10-CM | POA: Diagnosis not present

## 2023-05-28 DIAGNOSIS — I951 Orthostatic hypotension: Secondary | ICD-10-CM | POA: Diagnosis not present

## 2023-05-28 DIAGNOSIS — I110 Hypertensive heart disease with heart failure: Secondary | ICD-10-CM | POA: Diagnosis not present

## 2023-05-28 DIAGNOSIS — G8194 Hemiplegia, unspecified affecting left nondominant side: Secondary | ICD-10-CM | POA: Diagnosis not present

## 2023-05-28 DIAGNOSIS — E119 Type 2 diabetes mellitus without complications: Secondary | ICD-10-CM | POA: Diagnosis not present

## 2023-05-28 DIAGNOSIS — R630 Anorexia: Secondary | ICD-10-CM | POA: Diagnosis not present

## 2023-05-29 DIAGNOSIS — G8194 Hemiplegia, unspecified affecting left nondominant side: Secondary | ICD-10-CM | POA: Diagnosis not present

## 2023-05-29 DIAGNOSIS — R630 Anorexia: Secondary | ICD-10-CM | POA: Diagnosis not present

## 2023-05-29 DIAGNOSIS — E119 Type 2 diabetes mellitus without complications: Secondary | ICD-10-CM | POA: Diagnosis not present

## 2023-05-29 DIAGNOSIS — G20A1 Parkinson's disease without dyskinesia, without mention of fluctuations: Secondary | ICD-10-CM | POA: Diagnosis not present

## 2023-05-29 DIAGNOSIS — I951 Orthostatic hypotension: Secondary | ICD-10-CM | POA: Diagnosis not present

## 2023-05-29 DIAGNOSIS — I110 Hypertensive heart disease with heart failure: Secondary | ICD-10-CM | POA: Diagnosis not present

## 2023-05-30 DIAGNOSIS — G8194 Hemiplegia, unspecified affecting left nondominant side: Secondary | ICD-10-CM | POA: Diagnosis not present

## 2023-05-30 DIAGNOSIS — I951 Orthostatic hypotension: Secondary | ICD-10-CM | POA: Diagnosis not present

## 2023-05-30 DIAGNOSIS — E119 Type 2 diabetes mellitus without complications: Secondary | ICD-10-CM | POA: Diagnosis not present

## 2023-05-30 DIAGNOSIS — I110 Hypertensive heart disease with heart failure: Secondary | ICD-10-CM | POA: Diagnosis not present

## 2023-05-30 DIAGNOSIS — G20A1 Parkinson's disease without dyskinesia, without mention of fluctuations: Secondary | ICD-10-CM | POA: Diagnosis not present

## 2023-05-30 DIAGNOSIS — R630 Anorexia: Secondary | ICD-10-CM | POA: Diagnosis not present

## 2023-05-31 DIAGNOSIS — G20A1 Parkinson's disease without dyskinesia, without mention of fluctuations: Secondary | ICD-10-CM | POA: Diagnosis not present

## 2023-05-31 DIAGNOSIS — I951 Orthostatic hypotension: Secondary | ICD-10-CM | POA: Diagnosis not present

## 2023-05-31 DIAGNOSIS — G8194 Hemiplegia, unspecified affecting left nondominant side: Secondary | ICD-10-CM | POA: Diagnosis not present

## 2023-05-31 DIAGNOSIS — E119 Type 2 diabetes mellitus without complications: Secondary | ICD-10-CM | POA: Diagnosis not present

## 2023-05-31 DIAGNOSIS — R630 Anorexia: Secondary | ICD-10-CM | POA: Diagnosis not present

## 2023-05-31 DIAGNOSIS — I110 Hypertensive heart disease with heart failure: Secondary | ICD-10-CM | POA: Diagnosis not present

## 2023-06-01 DIAGNOSIS — R630 Anorexia: Secondary | ICD-10-CM | POA: Diagnosis not present

## 2023-06-01 DIAGNOSIS — I110 Hypertensive heart disease with heart failure: Secondary | ICD-10-CM | POA: Diagnosis not present

## 2023-06-01 DIAGNOSIS — G8194 Hemiplegia, unspecified affecting left nondominant side: Secondary | ICD-10-CM | POA: Diagnosis not present

## 2023-06-01 DIAGNOSIS — I951 Orthostatic hypotension: Secondary | ICD-10-CM | POA: Diagnosis not present

## 2023-06-01 DIAGNOSIS — E119 Type 2 diabetes mellitus without complications: Secondary | ICD-10-CM | POA: Diagnosis not present

## 2023-06-01 DIAGNOSIS — G20A1 Parkinson's disease without dyskinesia, without mention of fluctuations: Secondary | ICD-10-CM | POA: Diagnosis not present

## 2023-06-02 DIAGNOSIS — M479 Spondylosis, unspecified: Secondary | ICD-10-CM | POA: Diagnosis not present

## 2023-06-02 DIAGNOSIS — E785 Hyperlipidemia, unspecified: Secondary | ICD-10-CM | POA: Diagnosis not present

## 2023-06-02 DIAGNOSIS — I951 Orthostatic hypotension: Secondary | ICD-10-CM | POA: Diagnosis not present

## 2023-06-02 DIAGNOSIS — R000 Tachycardia, unspecified: Secondary | ICD-10-CM | POA: Diagnosis not present

## 2023-06-02 DIAGNOSIS — R630 Anorexia: Secondary | ICD-10-CM | POA: Diagnosis not present

## 2023-06-02 DIAGNOSIS — E119 Type 2 diabetes mellitus without complications: Secondary | ICD-10-CM | POA: Diagnosis not present

## 2023-06-02 DIAGNOSIS — K219 Gastro-esophageal reflux disease without esophagitis: Secondary | ICD-10-CM | POA: Diagnosis not present

## 2023-06-02 DIAGNOSIS — I110 Hypertensive heart disease with heart failure: Secondary | ICD-10-CM | POA: Diagnosis not present

## 2023-06-02 DIAGNOSIS — I428 Other cardiomyopathies: Secondary | ICD-10-CM | POA: Diagnosis not present

## 2023-06-02 DIAGNOSIS — G20A1 Parkinson's disease without dyskinesia, without mention of fluctuations: Secondary | ICD-10-CM | POA: Diagnosis not present

## 2023-06-02 DIAGNOSIS — G8194 Hemiplegia, unspecified affecting left nondominant side: Secondary | ICD-10-CM | POA: Diagnosis not present

## 2023-06-02 DIAGNOSIS — I509 Heart failure, unspecified: Secondary | ICD-10-CM | POA: Diagnosis not present

## 2023-06-05 DIAGNOSIS — I951 Orthostatic hypotension: Secondary | ICD-10-CM | POA: Diagnosis not present

## 2023-06-05 DIAGNOSIS — G8194 Hemiplegia, unspecified affecting left nondominant side: Secondary | ICD-10-CM | POA: Diagnosis not present

## 2023-06-05 DIAGNOSIS — G20A1 Parkinson's disease without dyskinesia, without mention of fluctuations: Secondary | ICD-10-CM | POA: Diagnosis not present

## 2023-06-05 DIAGNOSIS — I110 Hypertensive heart disease with heart failure: Secondary | ICD-10-CM | POA: Diagnosis not present

## 2023-06-05 DIAGNOSIS — R630 Anorexia: Secondary | ICD-10-CM | POA: Diagnosis not present

## 2023-06-05 DIAGNOSIS — E119 Type 2 diabetes mellitus without complications: Secondary | ICD-10-CM | POA: Diagnosis not present

## 2023-06-07 DIAGNOSIS — I110 Hypertensive heart disease with heart failure: Secondary | ICD-10-CM | POA: Diagnosis not present

## 2023-06-07 DIAGNOSIS — E119 Type 2 diabetes mellitus without complications: Secondary | ICD-10-CM | POA: Diagnosis not present

## 2023-06-07 DIAGNOSIS — R630 Anorexia: Secondary | ICD-10-CM | POA: Diagnosis not present

## 2023-06-07 DIAGNOSIS — G8194 Hemiplegia, unspecified affecting left nondominant side: Secondary | ICD-10-CM | POA: Diagnosis not present

## 2023-06-07 DIAGNOSIS — G20A1 Parkinson's disease without dyskinesia, without mention of fluctuations: Secondary | ICD-10-CM | POA: Diagnosis not present

## 2023-06-07 DIAGNOSIS — I951 Orthostatic hypotension: Secondary | ICD-10-CM | POA: Diagnosis not present

## 2023-06-08 DIAGNOSIS — E119 Type 2 diabetes mellitus without complications: Secondary | ICD-10-CM | POA: Diagnosis not present

## 2023-06-08 DIAGNOSIS — G20A1 Parkinson's disease without dyskinesia, without mention of fluctuations: Secondary | ICD-10-CM | POA: Diagnosis not present

## 2023-06-08 DIAGNOSIS — G8194 Hemiplegia, unspecified affecting left nondominant side: Secondary | ICD-10-CM | POA: Diagnosis not present

## 2023-06-08 DIAGNOSIS — I951 Orthostatic hypotension: Secondary | ICD-10-CM | POA: Diagnosis not present

## 2023-06-08 DIAGNOSIS — R630 Anorexia: Secondary | ICD-10-CM | POA: Diagnosis not present

## 2023-06-08 DIAGNOSIS — I110 Hypertensive heart disease with heart failure: Secondary | ICD-10-CM | POA: Diagnosis not present

## 2023-06-09 DIAGNOSIS — G20A1 Parkinson's disease without dyskinesia, without mention of fluctuations: Secondary | ICD-10-CM | POA: Diagnosis not present

## 2023-06-09 DIAGNOSIS — G8194 Hemiplegia, unspecified affecting left nondominant side: Secondary | ICD-10-CM | POA: Diagnosis not present

## 2023-06-09 DIAGNOSIS — R630 Anorexia: Secondary | ICD-10-CM | POA: Diagnosis not present

## 2023-06-09 DIAGNOSIS — I110 Hypertensive heart disease with heart failure: Secondary | ICD-10-CM | POA: Diagnosis not present

## 2023-06-09 DIAGNOSIS — E119 Type 2 diabetes mellitus without complications: Secondary | ICD-10-CM | POA: Diagnosis not present

## 2023-06-09 DIAGNOSIS — I951 Orthostatic hypotension: Secondary | ICD-10-CM | POA: Diagnosis not present

## 2023-06-12 DIAGNOSIS — I951 Orthostatic hypotension: Secondary | ICD-10-CM | POA: Diagnosis not present

## 2023-06-12 DIAGNOSIS — G20A1 Parkinson's disease without dyskinesia, without mention of fluctuations: Secondary | ICD-10-CM | POA: Diagnosis not present

## 2023-06-12 DIAGNOSIS — G8194 Hemiplegia, unspecified affecting left nondominant side: Secondary | ICD-10-CM | POA: Diagnosis not present

## 2023-06-12 DIAGNOSIS — I110 Hypertensive heart disease with heart failure: Secondary | ICD-10-CM | POA: Diagnosis not present

## 2023-06-12 DIAGNOSIS — R630 Anorexia: Secondary | ICD-10-CM | POA: Diagnosis not present

## 2023-06-12 DIAGNOSIS — E119 Type 2 diabetes mellitus without complications: Secondary | ICD-10-CM | POA: Diagnosis not present

## 2023-06-13 DIAGNOSIS — Z23 Encounter for immunization: Secondary | ICD-10-CM | POA: Diagnosis not present

## 2023-06-13 NOTE — Progress Notes (Signed)
Remote ICD transmission.   

## 2023-06-14 DIAGNOSIS — E119 Type 2 diabetes mellitus without complications: Secondary | ICD-10-CM | POA: Diagnosis not present

## 2023-06-14 DIAGNOSIS — G20A1 Parkinson's disease without dyskinesia, without mention of fluctuations: Secondary | ICD-10-CM | POA: Diagnosis not present

## 2023-06-14 DIAGNOSIS — I951 Orthostatic hypotension: Secondary | ICD-10-CM | POA: Diagnosis not present

## 2023-06-14 DIAGNOSIS — R630 Anorexia: Secondary | ICD-10-CM | POA: Diagnosis not present

## 2023-06-14 DIAGNOSIS — G8194 Hemiplegia, unspecified affecting left nondominant side: Secondary | ICD-10-CM | POA: Diagnosis not present

## 2023-06-14 DIAGNOSIS — I110 Hypertensive heart disease with heart failure: Secondary | ICD-10-CM | POA: Diagnosis not present

## 2023-06-15 DIAGNOSIS — E119 Type 2 diabetes mellitus without complications: Secondary | ICD-10-CM | POA: Diagnosis not present

## 2023-06-15 DIAGNOSIS — G20A1 Parkinson's disease without dyskinesia, without mention of fluctuations: Secondary | ICD-10-CM | POA: Diagnosis not present

## 2023-06-15 DIAGNOSIS — R630 Anorexia: Secondary | ICD-10-CM | POA: Diagnosis not present

## 2023-06-15 DIAGNOSIS — G8194 Hemiplegia, unspecified affecting left nondominant side: Secondary | ICD-10-CM | POA: Diagnosis not present

## 2023-06-15 DIAGNOSIS — I110 Hypertensive heart disease with heart failure: Secondary | ICD-10-CM | POA: Diagnosis not present

## 2023-06-15 DIAGNOSIS — I951 Orthostatic hypotension: Secondary | ICD-10-CM | POA: Diagnosis not present

## 2023-06-16 DIAGNOSIS — E119 Type 2 diabetes mellitus without complications: Secondary | ICD-10-CM | POA: Diagnosis not present

## 2023-06-16 DIAGNOSIS — R630 Anorexia: Secondary | ICD-10-CM | POA: Diagnosis not present

## 2023-06-16 DIAGNOSIS — G20A1 Parkinson's disease without dyskinesia, without mention of fluctuations: Secondary | ICD-10-CM | POA: Diagnosis not present

## 2023-06-16 DIAGNOSIS — I951 Orthostatic hypotension: Secondary | ICD-10-CM | POA: Diagnosis not present

## 2023-06-16 DIAGNOSIS — G8194 Hemiplegia, unspecified affecting left nondominant side: Secondary | ICD-10-CM | POA: Diagnosis not present

## 2023-06-16 DIAGNOSIS — I110 Hypertensive heart disease with heart failure: Secondary | ICD-10-CM | POA: Diagnosis not present

## 2023-06-19 DIAGNOSIS — G20A1 Parkinson's disease without dyskinesia, without mention of fluctuations: Secondary | ICD-10-CM | POA: Diagnosis not present

## 2023-06-19 DIAGNOSIS — I110 Hypertensive heart disease with heart failure: Secondary | ICD-10-CM | POA: Diagnosis not present

## 2023-06-19 DIAGNOSIS — R630 Anorexia: Secondary | ICD-10-CM | POA: Diagnosis not present

## 2023-06-19 DIAGNOSIS — I951 Orthostatic hypotension: Secondary | ICD-10-CM | POA: Diagnosis not present

## 2023-06-19 DIAGNOSIS — G8194 Hemiplegia, unspecified affecting left nondominant side: Secondary | ICD-10-CM | POA: Diagnosis not present

## 2023-06-19 DIAGNOSIS — E119 Type 2 diabetes mellitus without complications: Secondary | ICD-10-CM | POA: Diagnosis not present

## 2023-06-21 DIAGNOSIS — E119 Type 2 diabetes mellitus without complications: Secondary | ICD-10-CM | POA: Diagnosis not present

## 2023-06-21 DIAGNOSIS — G20A1 Parkinson's disease without dyskinesia, without mention of fluctuations: Secondary | ICD-10-CM | POA: Diagnosis not present

## 2023-06-21 DIAGNOSIS — I110 Hypertensive heart disease with heart failure: Secondary | ICD-10-CM | POA: Diagnosis not present

## 2023-06-21 DIAGNOSIS — G8194 Hemiplegia, unspecified affecting left nondominant side: Secondary | ICD-10-CM | POA: Diagnosis not present

## 2023-06-21 DIAGNOSIS — I951 Orthostatic hypotension: Secondary | ICD-10-CM | POA: Diagnosis not present

## 2023-06-21 DIAGNOSIS — R630 Anorexia: Secondary | ICD-10-CM | POA: Diagnosis not present

## 2023-06-22 DIAGNOSIS — R630 Anorexia: Secondary | ICD-10-CM | POA: Diagnosis not present

## 2023-06-22 DIAGNOSIS — G20A1 Parkinson's disease without dyskinesia, without mention of fluctuations: Secondary | ICD-10-CM | POA: Diagnosis not present

## 2023-06-22 DIAGNOSIS — I110 Hypertensive heart disease with heart failure: Secondary | ICD-10-CM | POA: Diagnosis not present

## 2023-06-22 DIAGNOSIS — I951 Orthostatic hypotension: Secondary | ICD-10-CM | POA: Diagnosis not present

## 2023-06-22 DIAGNOSIS — G8194 Hemiplegia, unspecified affecting left nondominant side: Secondary | ICD-10-CM | POA: Diagnosis not present

## 2023-06-22 DIAGNOSIS — E119 Type 2 diabetes mellitus without complications: Secondary | ICD-10-CM | POA: Diagnosis not present

## 2023-06-23 DIAGNOSIS — G20A1 Parkinson's disease without dyskinesia, without mention of fluctuations: Secondary | ICD-10-CM | POA: Diagnosis not present

## 2023-06-23 DIAGNOSIS — G8194 Hemiplegia, unspecified affecting left nondominant side: Secondary | ICD-10-CM | POA: Diagnosis not present

## 2023-06-23 DIAGNOSIS — E119 Type 2 diabetes mellitus without complications: Secondary | ICD-10-CM | POA: Diagnosis not present

## 2023-06-23 DIAGNOSIS — I110 Hypertensive heart disease with heart failure: Secondary | ICD-10-CM | POA: Diagnosis not present

## 2023-06-23 DIAGNOSIS — R630 Anorexia: Secondary | ICD-10-CM | POA: Diagnosis not present

## 2023-06-23 DIAGNOSIS — I951 Orthostatic hypotension: Secondary | ICD-10-CM | POA: Diagnosis not present

## 2023-06-26 DIAGNOSIS — I110 Hypertensive heart disease with heart failure: Secondary | ICD-10-CM | POA: Diagnosis not present

## 2023-06-26 DIAGNOSIS — G20A1 Parkinson's disease without dyskinesia, without mention of fluctuations: Secondary | ICD-10-CM | POA: Diagnosis not present

## 2023-06-26 DIAGNOSIS — I951 Orthostatic hypotension: Secondary | ICD-10-CM | POA: Diagnosis not present

## 2023-06-26 DIAGNOSIS — E119 Type 2 diabetes mellitus without complications: Secondary | ICD-10-CM | POA: Diagnosis not present

## 2023-06-26 DIAGNOSIS — R630 Anorexia: Secondary | ICD-10-CM | POA: Diagnosis not present

## 2023-06-26 DIAGNOSIS — G8194 Hemiplegia, unspecified affecting left nondominant side: Secondary | ICD-10-CM | POA: Diagnosis not present

## 2023-06-28 DIAGNOSIS — G20A1 Parkinson's disease without dyskinesia, without mention of fluctuations: Secondary | ICD-10-CM | POA: Diagnosis not present

## 2023-06-28 DIAGNOSIS — I951 Orthostatic hypotension: Secondary | ICD-10-CM | POA: Diagnosis not present

## 2023-06-28 DIAGNOSIS — E119 Type 2 diabetes mellitus without complications: Secondary | ICD-10-CM | POA: Diagnosis not present

## 2023-06-28 DIAGNOSIS — I110 Hypertensive heart disease with heart failure: Secondary | ICD-10-CM | POA: Diagnosis not present

## 2023-06-28 DIAGNOSIS — R630 Anorexia: Secondary | ICD-10-CM | POA: Diagnosis not present

## 2023-06-28 DIAGNOSIS — G8194 Hemiplegia, unspecified affecting left nondominant side: Secondary | ICD-10-CM | POA: Diagnosis not present

## 2023-06-30 DIAGNOSIS — E119 Type 2 diabetes mellitus without complications: Secondary | ICD-10-CM | POA: Diagnosis not present

## 2023-06-30 DIAGNOSIS — G20A1 Parkinson's disease without dyskinesia, without mention of fluctuations: Secondary | ICD-10-CM | POA: Diagnosis not present

## 2023-06-30 DIAGNOSIS — I110 Hypertensive heart disease with heart failure: Secondary | ICD-10-CM | POA: Diagnosis not present

## 2023-06-30 DIAGNOSIS — I951 Orthostatic hypotension: Secondary | ICD-10-CM | POA: Diagnosis not present

## 2023-06-30 DIAGNOSIS — G8194 Hemiplegia, unspecified affecting left nondominant side: Secondary | ICD-10-CM | POA: Diagnosis not present

## 2023-06-30 DIAGNOSIS — R630 Anorexia: Secondary | ICD-10-CM | POA: Diagnosis not present

## 2023-07-02 DIAGNOSIS — I509 Heart failure, unspecified: Secondary | ICD-10-CM | POA: Diagnosis not present

## 2023-07-02 DIAGNOSIS — G20A1 Parkinson's disease without dyskinesia, without mention of fluctuations: Secondary | ICD-10-CM | POA: Diagnosis not present

## 2023-07-02 DIAGNOSIS — I428 Other cardiomyopathies: Secondary | ICD-10-CM | POA: Diagnosis not present

## 2023-07-02 DIAGNOSIS — R000 Tachycardia, unspecified: Secondary | ICD-10-CM | POA: Diagnosis not present

## 2023-07-02 DIAGNOSIS — I110 Hypertensive heart disease with heart failure: Secondary | ICD-10-CM | POA: Diagnosis not present

## 2023-07-02 DIAGNOSIS — K219 Gastro-esophageal reflux disease without esophagitis: Secondary | ICD-10-CM | POA: Diagnosis not present

## 2023-07-02 DIAGNOSIS — I951 Orthostatic hypotension: Secondary | ICD-10-CM | POA: Diagnosis not present

## 2023-07-02 DIAGNOSIS — G8194 Hemiplegia, unspecified affecting left nondominant side: Secondary | ICD-10-CM | POA: Diagnosis not present

## 2023-07-02 DIAGNOSIS — E119 Type 2 diabetes mellitus without complications: Secondary | ICD-10-CM | POA: Diagnosis not present

## 2023-07-02 DIAGNOSIS — R630 Anorexia: Secondary | ICD-10-CM | POA: Diagnosis not present

## 2023-07-02 DIAGNOSIS — E785 Hyperlipidemia, unspecified: Secondary | ICD-10-CM | POA: Diagnosis not present

## 2023-07-02 DIAGNOSIS — M479 Spondylosis, unspecified: Secondary | ICD-10-CM | POA: Diagnosis not present

## 2023-07-03 ENCOUNTER — Ambulatory Visit: Attending: Cardiovascular Disease | Admitting: Cardiovascular Disease

## 2023-07-03 ENCOUNTER — Encounter: Payer: Self-pay | Admitting: Cardiovascular Disease

## 2023-07-03 VITALS — BP 122/62 | HR 103 | Ht 61.0 in | Wt 107.4 lb

## 2023-07-03 DIAGNOSIS — G8194 Hemiplegia, unspecified affecting left nondominant side: Secondary | ICD-10-CM | POA: Diagnosis not present

## 2023-07-03 DIAGNOSIS — R630 Anorexia: Secondary | ICD-10-CM | POA: Diagnosis not present

## 2023-07-03 DIAGNOSIS — G20A1 Parkinson's disease without dyskinesia, without mention of fluctuations: Secondary | ICD-10-CM | POA: Diagnosis not present

## 2023-07-03 DIAGNOSIS — R000 Tachycardia, unspecified: Secondary | ICD-10-CM

## 2023-07-03 DIAGNOSIS — I5020 Unspecified systolic (congestive) heart failure: Secondary | ICD-10-CM

## 2023-07-03 DIAGNOSIS — I110 Hypertensive heart disease with heart failure: Secondary | ICD-10-CM | POA: Diagnosis not present

## 2023-07-03 DIAGNOSIS — E1159 Type 2 diabetes mellitus with other circulatory complications: Secondary | ICD-10-CM | POA: Insufficient documentation

## 2023-07-03 DIAGNOSIS — I152 Hypertension secondary to endocrine disorders: Secondary | ICD-10-CM

## 2023-07-03 DIAGNOSIS — I951 Orthostatic hypotension: Secondary | ICD-10-CM | POA: Diagnosis not present

## 2023-07-03 DIAGNOSIS — E119 Type 2 diabetes mellitus without complications: Secondary | ICD-10-CM | POA: Diagnosis not present

## 2023-07-03 NOTE — Progress Notes (Signed)
Cardiology Office Note:    Date:  07/03/2023   ID:  Veronica Shaw, DOB 1951/06/17, MRN [id number]  PCP:  Levin Erp, MD   [facility] Providers Cardiologist:  [name], MD Cardiology APP:  Beatrice Lecher, PA-C  Electrophysiologist:  Lewayne Bunting, MD     Referring MD: Levin Erp, MD   Chief Complaint  Patient presents with   Shortness of Breath    History of Present Illness:    Veronica Shaw is a 72 y.o. female with a hx of:  HFmrEF (heart failure with mildly reduced ejection fraction)  Non-ischemic cardiomyopathy Cath 2014: no sig CAD Myoview 12/29/16: EF 49, no ischemia  CMR 4/15: EF 34 Echocardiogram 5/17: EF 20-25 Echocardiogram 6/18: EF 30-35 Echo 10/18/17: EF 60-65, normal wall motion, grade 2 diastolic dysfunction  Echo 10/18/20: EF 40-45, global HK, normal RVSF, RVSP 22  S/p AICD Diabetes mellitus Endo: Dr. Lonzo Cloud Hyperlipidemia Chronic kidney disease  OSA Asthma Parkinson's Disease Neuro: Dr. Arbutus Leas Syncope 09/2017 Cervical spine DDD  The patient is here with her granddaughter and great grandson today. She reports no cardiac symptoms. Today, she denies symptoms of palpitations, chest pain, orthopnea, PND, lower extremity edema, dizziness, or syncope. She has chronic dyspnea with no recent change. She is minimally active due to advanced Parkinson's disease. The patient is under Hospice care at home.   Current Medications: Current Meds  Medication Sig   acetaminophen (TYLENOL) 500 MG tablet Take 2 tablets (1,000 mg total) by mouth every 8 (eight) hours as needed.   albuterol (PROVENTIL) (2.5 MG/3ML) 0.083% nebulizer solution Inhale into the lungs.   albuterol (VENTOLIN HFA) 108 (90 Base) MCG/ACT inhaler Inhale into the lungs.   carbidopa-levodopa (SINEMET IR) 25-100 MG tablet TAKE 2 TABLETS AT 7 AM, 2 TABLETS AT 11AM, AND 1 TABLET AT 4 PM (Patient taking differently: 1 tablet 3 (three) times daily. TAKE 1 TABLET AT 7 AM 1  TABLETS AT 11AM, AND 1 TABLET AT 4 PM)   diclofenac Sodium (VOLTAREN) 1 % GEL APPLY 4 GRAMS TOPICALLY FOUR TIMES DAILY   famotidine (PEPCID) 20 MG tablet TAKE 1 TABLET TWICE DAILY (Patient taking differently: Take 20 mg by mouth at bedtime.)   gabapentin (NEURONTIN) 300 MG capsule Take 300 mg by mouth 3 (three) times daily.   meclizine (ANTIVERT) 25 MG tablet Take 1 tablet (25 mg total) by mouth 3 (three) times daily as needed for dizziness.   melatonin 5 MG TABS Take 5 mg by mouth at bedtime.   metFORMIN (GLUCOPHAGE-XR) 500 MG 24 hr tablet Take 500 mg by mouth daily.   montelukast (SINGULAIR) 10 MG tablet Take 1 tablet (10 mg total) by mouth at bedtime.   OXYGEN Inhale into the lungs at bedtime. Inhale 2L into lungs   polyethylene glycol (MIRALAX / GLYCOLAX) 17 g packet Take 17 g by mouth daily.   sulfamethoxazole-trimethoprim (BACTRIM DS) 800-160 MG tablet Take 1 tablet by mouth daily.   [DISCONTINUED] estradiol (ESTRACE) 0.1 MG/GM vaginal cream every other day.   [DISCONTINUED] rosuvastatin (CRESTOR) 10 MG tablet Take 1 tablet (10 mg total) by mouth as directed. Three times a week     Allergies:   Aspirin, Mirtazapine, Penicillins, Prempro [conj estrog-medroxyprogest ace], Ace inhibitors, and Simvastatin   ROS:   Please see the history of present illness.    All other systems reviewed and are negative.  EKGs/Labs/Other Studies Reviewed:    The following studies were reviewed today: Cardiac Studies & Procedures  ECHOCARDIOGRAM  ECHOCARDIOGRAM COMPLETE 09/22/2022  Narrative ECHOCARDIOGRAM REPORT    Patient Name:   Veronica Shaw Date of Exam: 09/22/2022 Medical Rec #:  [id number]          Height:       61.0 in Accession #:    [id number]         Weight:       110.0 lb Date of Birth:  1950-10-22           BSA:          1.465 m Patient Age:    71 years           BP:           127/80 mmHg Patient Gender: F                  HR:           127 bpm. Exam Location:   Inpatient  Procedure: 2D Echo, Cardiac Doppler and Color Doppler  Indications:    R55 Syncope  History:        Patient has prior history of Echocardiogram examinations, most recent 10/18/2020. Pacemaker; Risk Factors:Diabetes, Hypertension and Dyslipidemia.  Sonographer:    Dondra Prader RVT RCS Referring Phys: [id number] SARA L NEAL  IMPRESSIONS   1. Global hypokinesis with abnormal septal motion consider re assessing EF when HR slowr Study done with HR of 126 bpm. Left ventricular ejection fraction, by estimation, is 45 to 50%. The left ventricle has mildly decreased function. The left ventricle demonstrates global hypokinesis. The left ventricular internal cavity size was mildly dilated. Left ventricular diastolic parameters are indeterminate. 2. Pacing wires in RA/RV . Right ventricular systolic function is normal. The right ventricular size is normal. 3. The mitral valve is abnormal. Trivial mitral valve regurgitation. No evidence of mitral stenosis. 4. The aortic valve is tricuspid. There is mild calcification of the aortic valve. There is mild thickening of the aortic valve. Aortic valve regurgitation is not visualized. Aortic valve sclerosis/calcification is present, without any evidence of aortic stenosis. 5. The inferior vena cava is normal in size with greater than 50% respiratory variability, suggesting right atrial pressure of 3 mmHg.  Comparison(s): EF 40-45%.  FINDINGS Left Ventricle: Global hypokinesis with abnormal septal motion consider re assessing EF when HR slowr Study done with HR of 126 bpm. Left ventricular ejection fraction, by estimation, is 45 to 50%. The left ventricle has mildly decreased function. The left ventricle demonstrates global hypokinesis. The left ventricular internal cavity size was mildly dilated. There is no left ventricular hypertrophy. Left ventricular diastolic parameters are indeterminate.  Right Ventricle: Pacing wires in RA/RV. The right  ventricular size is normal. No increase in right ventricular wall thickness. Right ventricular systolic function is normal.  Left Atrium: Left atrial size was normal in size.  Right Atrium: Right atrial size was normal in size.  Pericardium: Trivial pericardial effusion is present. The pericardial effusion is posterior to the left ventricle.  Mitral Valve: The mitral valve is abnormal. There is mild thickening of the mitral valve leaflet(s). There is mild calcification of the mitral valve leaflet(s). Trivial mitral valve regurgitation. No evidence of mitral valve stenosis.  Tricuspid Valve: The tricuspid valve is normal in structure. Tricuspid valve regurgitation is not demonstrated. No evidence of tricuspid stenosis.  Aortic Valve: The aortic valve is tricuspid. There is mild calcification of the aortic valve. There is mild thickening of the aortic valve. Aortic valve regurgitation is not visualized. Aortic  valve sclerosis/calcification is present, without any evidence of aortic stenosis. Aortic valve mean gradient measures 2.0 mmHg. Aortic valve peak gradient measures 3.7 mmHg. Aortic valve area, by VTI measures 3.06 cm.  Pulmonic Valve: The pulmonic valve was normal in structure. Pulmonic valve regurgitation is not visualized. No evidence of pulmonic stenosis.  Aorta: The aortic root is normal in size and structure.  Venous: The inferior vena cava is normal in size with greater than 50% respiratory variability, suggesting right atrial pressure of 3 mmHg.  IAS/Shunts: No atrial level shunt detected by color flow Doppler.   LEFT VENTRICLE PLAX 2D LVIDd:         3.90 cm   Diastology LVIDs:         3.00 cm   LV e' medial:   4.90 cm/s LV PW:         0.80 cm   LV E/e' medial: 24.9 LV IVS:        0.90 cm LVOT diam:     2.20 cm LV SV:         38 LV SV Index:   26 LVOT Area:     3.80 cm   RIGHT VENTRICLE RV S prime:     17.40 cm/s TAPSE (M-mode): 1.5 cm  LEFT ATRIUM          Index LA diam:    2.80 cm 1.91 cm/m AORTIC VALVE                    PULMONIC VALVE AV Area (Vmax):    3.22 cm     PV Vmax:       1.02 m/s AV Area (Vmean):   3.05 cm     PV Peak grad:  4.2 mmHg AV Area (VTI):     3.06 cm AV Vmax:           95.60 cm/s AV Vmean:          66.500 cm/s AV VTI:            0.124 m AV Peak Grad:      3.7 mmHg AV Mean Grad:      2.0 mmHg LVOT Vmax:         81.00 cm/s LVOT Vmean:        53.400 cm/s LVOT VTI:          0.100 m LVOT/AV VTI ratio: 0.80  AORTA Ao Root diam: 2.80 cm  MITRAL VALVE MV Area (PHT): 6.54 cm     SHUNTS MV Decel Time: 116 msec     Systemic VTI:  0.10 m MV E velocity: 122.00 cm/s  Systemic Diam: 2.20 cm  Charlton Haws MD Electronically signed by Charlton Haws MD Signature Date/Time: 09/22/2022/6:10:10 PM    Final             EKG:   EKG Interpretation Date/Time:  Monday July 03 2023 14:31:01 EST Ventricular Rate:  103 PR Interval:  148 QRS Duration:  128 QT Interval:  378 QTC Calculation: 495 R Axis:   153  Text Interpretation: Sinus tachycardia Right axis deviation Non-specific intra-ventricular conduction block Cannot rule out Septal infarct , age undetermined T wave abnormality, consider inferior ischemia When compared with ECG of 22-Sep-2022 16:52, QRS axis Shifted right T wave inversion now evident in Inferior leads T wave amplitude has increased in Lateral leads Confirmed by [name] [id number]) on 07/03/2023 2:39:58 PM    Recent Labs: 08/15/2022: ALT <5 09/14/2022: NT-Pro BNP 886 09/22/2022: BUN 13;  Creatinine, Ser 0.94; Hemoglobin 12.0; Magnesium 2.0; Platelets 172; Potassium 3.5; Sodium 141  Recent Lipid Panel    Component Value Date/Time   CHOL 171 11/15/2021 0915   TRIG 120 11/15/2021 0915   HDL 53 11/15/2021 0915   CHOLHDL 3.2 11/15/2021 0915   CHOLHDL 3.5 05/31/2013 0435   VLDL 28 05/31/2013 0435   LDLCALC 97 11/15/2021 0915   LDLDIRECT 111 (H) 02/10/2015 1145     Risk Assessment/Calculations:                 Physical Exam:    VS:  BP 122/62   Pulse (!) 103   Ht 5\' 1"  (1.549 m)   Wt 107 lb 6.4 oz (48.7 kg)   LMP 08/01/2000 (Approximate)   SpO2 94%   BMI 20.29 kg/m     Wt Readings from Last 3 Encounters:  07/03/23 107 lb 6.4 oz (48.7 kg)  03/15/23 105 lb (47.6 kg)  12/19/22 105 lb 6.4 oz (47.8 kg)     GEN: Well nourished, well developed, chronically ill-appearing in no acute distress. Pt in wheelchair.  HEENT: Normal NECK: No JVD; No carotid bruits LYMPHATICS: No lymphadenopathy CARDIAC: RRR, no murmurs, rubs, gallops RESPIRATORY:  Clear to auscultation without rales, wheezing or rhonchi  ABDOMEN: Soft, non-tender, non-distended MUSCULOSKELETAL:  No edema; No deformity  SKIN: Warm and dry NEUROLOGIC:  Alert and oriented x 3 PSYCHIATRIC:  Normal affect   Assessment & Plan Hypertension associated with diabetes (HCC) BP controlled. Not requiring any Rx at present.  Increased pulse rate Chronic. EKG shows sinus tachycardia. Continue to follow.  HFrEF (heart failure with reduced ejection fraction) (HCC) Stable. Unable to tolerate GDMT with advanced Parkinson's and frailty, low BP. No volume overload on exam. Follow-up 6 months depending on her overall health.             Medication Adjustments/Labs and Tests Ordered: Current medicines are reviewed at length with the patient today.  Concerns regarding medicines are outlined above.  Orders Placed This Encounter  Procedures   EKG 12-Lead   No orders of the defined types were placed in this encounter.   Patient Instructions  Follow-Up: At [facility], you and your health needs are our priority.  As part of our continuing mission to provide you with exceptional heart care, we have created designated Provider Care Teams.  These Care Teams include your primary Cardiologist (physician) and Advanced Practice Providers (APPs -  Physician Assistants and Nurse Practitioners) who all work together to  provide you with the care you need, when you need it.  Your next appointment:   6 month(s)  Provider:   [name], MD  or Tereso Newcomer, [place]      Signed, [name], MD  07/03/2023 6:01 PM    [facility]

## 2023-07-03 NOTE — Assessment & Plan Note (Signed)
BP controlled. Not requiring any Rx at present.

## 2023-07-03 NOTE — Patient Instructions (Signed)
Follow-Up: At [facility], you and your health needs are our priority.  As part of our continuing mission to provide you with exceptional heart care, we have created designated Provider Care Teams.  These Care Teams include your primary Cardiologist (physician) and Advanced Practice Providers (APPs -  Physician Assistants and Nurse Practitioners) who all work together to provide you with the care you need, when you need it.  Your next appointment:   6 month(s)  Provider:   [name], MD  or Tereso Newcomer, [place]

## 2023-07-03 NOTE — Assessment & Plan Note (Signed)
Stable. Unable to tolerate GDMT with advanced Parkinson's and frailty, low BP. No volume overload on exam. Follow-up 6 months depending on her overall health.

## 2023-07-05 DIAGNOSIS — G20A1 Parkinson's disease without dyskinesia, without mention of fluctuations: Secondary | ICD-10-CM | POA: Diagnosis not present

## 2023-07-05 DIAGNOSIS — E119 Type 2 diabetes mellitus without complications: Secondary | ICD-10-CM | POA: Diagnosis not present

## 2023-07-05 DIAGNOSIS — I110 Hypertensive heart disease with heart failure: Secondary | ICD-10-CM | POA: Diagnosis not present

## 2023-07-05 DIAGNOSIS — G8194 Hemiplegia, unspecified affecting left nondominant side: Secondary | ICD-10-CM | POA: Diagnosis not present

## 2023-07-05 DIAGNOSIS — I951 Orthostatic hypotension: Secondary | ICD-10-CM | POA: Diagnosis not present

## 2023-07-05 DIAGNOSIS — R630 Anorexia: Secondary | ICD-10-CM | POA: Diagnosis not present

## 2023-07-06 DIAGNOSIS — E119 Type 2 diabetes mellitus without complications: Secondary | ICD-10-CM | POA: Diagnosis not present

## 2023-07-06 DIAGNOSIS — R630 Anorexia: Secondary | ICD-10-CM | POA: Diagnosis not present

## 2023-07-06 DIAGNOSIS — I951 Orthostatic hypotension: Secondary | ICD-10-CM | POA: Diagnosis not present

## 2023-07-06 DIAGNOSIS — I110 Hypertensive heart disease with heart failure: Secondary | ICD-10-CM | POA: Diagnosis not present

## 2023-07-06 DIAGNOSIS — G8194 Hemiplegia, unspecified affecting left nondominant side: Secondary | ICD-10-CM | POA: Diagnosis not present

## 2023-07-06 DIAGNOSIS — G20A1 Parkinson's disease without dyskinesia, without mention of fluctuations: Secondary | ICD-10-CM | POA: Diagnosis not present

## 2023-07-07 DIAGNOSIS — I951 Orthostatic hypotension: Secondary | ICD-10-CM | POA: Diagnosis not present

## 2023-07-07 DIAGNOSIS — G20A1 Parkinson's disease without dyskinesia, without mention of fluctuations: Secondary | ICD-10-CM | POA: Diagnosis not present

## 2023-07-07 DIAGNOSIS — I110 Hypertensive heart disease with heart failure: Secondary | ICD-10-CM | POA: Diagnosis not present

## 2023-07-07 DIAGNOSIS — G8194 Hemiplegia, unspecified affecting left nondominant side: Secondary | ICD-10-CM | POA: Diagnosis not present

## 2023-07-07 DIAGNOSIS — E119 Type 2 diabetes mellitus without complications: Secondary | ICD-10-CM | POA: Diagnosis not present

## 2023-07-07 DIAGNOSIS — R630 Anorexia: Secondary | ICD-10-CM | POA: Diagnosis not present

## 2023-07-10 DIAGNOSIS — I951 Orthostatic hypotension: Secondary | ICD-10-CM | POA: Diagnosis not present

## 2023-07-10 DIAGNOSIS — I110 Hypertensive heart disease with heart failure: Secondary | ICD-10-CM | POA: Diagnosis not present

## 2023-07-10 DIAGNOSIS — G8194 Hemiplegia, unspecified affecting left nondominant side: Secondary | ICD-10-CM | POA: Diagnosis not present

## 2023-07-10 DIAGNOSIS — E119 Type 2 diabetes mellitus without complications: Secondary | ICD-10-CM | POA: Diagnosis not present

## 2023-07-10 DIAGNOSIS — R630 Anorexia: Secondary | ICD-10-CM | POA: Diagnosis not present

## 2023-07-10 DIAGNOSIS — G20A1 Parkinson's disease without dyskinesia, without mention of fluctuations: Secondary | ICD-10-CM | POA: Diagnosis not present

## 2023-07-12 DIAGNOSIS — I110 Hypertensive heart disease with heart failure: Secondary | ICD-10-CM | POA: Diagnosis not present

## 2023-07-12 DIAGNOSIS — G20A1 Parkinson's disease without dyskinesia, without mention of fluctuations: Secondary | ICD-10-CM | POA: Diagnosis not present

## 2023-07-12 DIAGNOSIS — I951 Orthostatic hypotension: Secondary | ICD-10-CM | POA: Diagnosis not present

## 2023-07-12 DIAGNOSIS — G8194 Hemiplegia, unspecified affecting left nondominant side: Secondary | ICD-10-CM | POA: Diagnosis not present

## 2023-07-12 DIAGNOSIS — E119 Type 2 diabetes mellitus without complications: Secondary | ICD-10-CM | POA: Diagnosis not present

## 2023-07-12 DIAGNOSIS — R630 Anorexia: Secondary | ICD-10-CM | POA: Diagnosis not present

## 2023-07-13 DIAGNOSIS — I110 Hypertensive heart disease with heart failure: Secondary | ICD-10-CM | POA: Diagnosis not present

## 2023-07-13 DIAGNOSIS — I951 Orthostatic hypotension: Secondary | ICD-10-CM | POA: Diagnosis not present

## 2023-07-13 DIAGNOSIS — G8194 Hemiplegia, unspecified affecting left nondominant side: Secondary | ICD-10-CM | POA: Diagnosis not present

## 2023-07-13 DIAGNOSIS — R630 Anorexia: Secondary | ICD-10-CM | POA: Diagnosis not present

## 2023-07-13 DIAGNOSIS — E119 Type 2 diabetes mellitus without complications: Secondary | ICD-10-CM | POA: Diagnosis not present

## 2023-07-13 DIAGNOSIS — G20A1 Parkinson's disease without dyskinesia, without mention of fluctuations: Secondary | ICD-10-CM | POA: Diagnosis not present

## 2023-07-14 DIAGNOSIS — G8194 Hemiplegia, unspecified affecting left nondominant side: Secondary | ICD-10-CM | POA: Diagnosis not present

## 2023-07-14 DIAGNOSIS — E119 Type 2 diabetes mellitus without complications: Secondary | ICD-10-CM | POA: Diagnosis not present

## 2023-07-14 DIAGNOSIS — I951 Orthostatic hypotension: Secondary | ICD-10-CM | POA: Diagnosis not present

## 2023-07-14 DIAGNOSIS — G20A1 Parkinson's disease without dyskinesia, without mention of fluctuations: Secondary | ICD-10-CM | POA: Diagnosis not present

## 2023-07-14 DIAGNOSIS — I110 Hypertensive heart disease with heart failure: Secondary | ICD-10-CM | POA: Diagnosis not present

## 2023-07-14 DIAGNOSIS — R630 Anorexia: Secondary | ICD-10-CM | POA: Diagnosis not present

## 2023-07-17 DIAGNOSIS — G8194 Hemiplegia, unspecified affecting left nondominant side: Secondary | ICD-10-CM | POA: Diagnosis not present

## 2023-07-17 DIAGNOSIS — I951 Orthostatic hypotension: Secondary | ICD-10-CM | POA: Diagnosis not present

## 2023-07-17 DIAGNOSIS — R630 Anorexia: Secondary | ICD-10-CM | POA: Diagnosis not present

## 2023-07-17 DIAGNOSIS — E119 Type 2 diabetes mellitus without complications: Secondary | ICD-10-CM | POA: Diagnosis not present

## 2023-07-17 DIAGNOSIS — I110 Hypertensive heart disease with heart failure: Secondary | ICD-10-CM | POA: Diagnosis not present

## 2023-07-17 DIAGNOSIS — G20A1 Parkinson's disease without dyskinesia, without mention of fluctuations: Secondary | ICD-10-CM | POA: Diagnosis not present

## 2023-07-20 DIAGNOSIS — G8194 Hemiplegia, unspecified affecting left nondominant side: Secondary | ICD-10-CM | POA: Diagnosis not present

## 2023-07-20 DIAGNOSIS — E119 Type 2 diabetes mellitus without complications: Secondary | ICD-10-CM | POA: Diagnosis not present

## 2023-07-20 DIAGNOSIS — R630 Anorexia: Secondary | ICD-10-CM | POA: Diagnosis not present

## 2023-07-20 DIAGNOSIS — I951 Orthostatic hypotension: Secondary | ICD-10-CM | POA: Diagnosis not present

## 2023-07-20 DIAGNOSIS — G20A1 Parkinson's disease without dyskinesia, without mention of fluctuations: Secondary | ICD-10-CM | POA: Diagnosis not present

## 2023-07-20 DIAGNOSIS — I110 Hypertensive heart disease with heart failure: Secondary | ICD-10-CM | POA: Diagnosis not present

## 2023-07-21 DIAGNOSIS — I951 Orthostatic hypotension: Secondary | ICD-10-CM | POA: Diagnosis not present

## 2023-07-21 DIAGNOSIS — I110 Hypertensive heart disease with heart failure: Secondary | ICD-10-CM | POA: Diagnosis not present

## 2023-07-21 DIAGNOSIS — G8194 Hemiplegia, unspecified affecting left nondominant side: Secondary | ICD-10-CM | POA: Diagnosis not present

## 2023-07-21 DIAGNOSIS — E119 Type 2 diabetes mellitus without complications: Secondary | ICD-10-CM | POA: Diagnosis not present

## 2023-07-21 DIAGNOSIS — R630 Anorexia: Secondary | ICD-10-CM | POA: Diagnosis not present

## 2023-07-21 DIAGNOSIS — G20A1 Parkinson's disease without dyskinesia, without mention of fluctuations: Secondary | ICD-10-CM | POA: Diagnosis not present

## 2023-07-24 DIAGNOSIS — I110 Hypertensive heart disease with heart failure: Secondary | ICD-10-CM | POA: Diagnosis not present

## 2023-07-24 DIAGNOSIS — E119 Type 2 diabetes mellitus without complications: Secondary | ICD-10-CM | POA: Diagnosis not present

## 2023-07-24 DIAGNOSIS — G20A1 Parkinson's disease without dyskinesia, without mention of fluctuations: Secondary | ICD-10-CM | POA: Diagnosis not present

## 2023-07-24 DIAGNOSIS — R630 Anorexia: Secondary | ICD-10-CM | POA: Diagnosis not present

## 2023-07-24 DIAGNOSIS — I951 Orthostatic hypotension: Secondary | ICD-10-CM | POA: Diagnosis not present

## 2023-07-24 DIAGNOSIS — G8194 Hemiplegia, unspecified affecting left nondominant side: Secondary | ICD-10-CM | POA: Diagnosis not present

## 2023-07-26 DIAGNOSIS — G20A1 Parkinson's disease without dyskinesia, without mention of fluctuations: Secondary | ICD-10-CM | POA: Diagnosis not present

## 2023-07-26 DIAGNOSIS — I110 Hypertensive heart disease with heart failure: Secondary | ICD-10-CM | POA: Diagnosis not present

## 2023-07-26 DIAGNOSIS — R630 Anorexia: Secondary | ICD-10-CM | POA: Diagnosis not present

## 2023-07-26 DIAGNOSIS — I951 Orthostatic hypotension: Secondary | ICD-10-CM | POA: Diagnosis not present

## 2023-07-26 DIAGNOSIS — G8194 Hemiplegia, unspecified affecting left nondominant side: Secondary | ICD-10-CM | POA: Diagnosis not present

## 2023-07-26 DIAGNOSIS — E119 Type 2 diabetes mellitus without complications: Secondary | ICD-10-CM | POA: Diagnosis not present

## 2023-07-28 DIAGNOSIS — I110 Hypertensive heart disease with heart failure: Secondary | ICD-10-CM | POA: Diagnosis not present

## 2023-07-28 DIAGNOSIS — G8194 Hemiplegia, unspecified affecting left nondominant side: Secondary | ICD-10-CM | POA: Diagnosis not present

## 2023-07-28 DIAGNOSIS — R630 Anorexia: Secondary | ICD-10-CM | POA: Diagnosis not present

## 2023-07-28 DIAGNOSIS — I951 Orthostatic hypotension: Secondary | ICD-10-CM | POA: Diagnosis not present

## 2023-07-28 DIAGNOSIS — G20A1 Parkinson's disease without dyskinesia, without mention of fluctuations: Secondary | ICD-10-CM | POA: Diagnosis not present

## 2023-07-28 DIAGNOSIS — E119 Type 2 diabetes mellitus without complications: Secondary | ICD-10-CM | POA: Diagnosis not present

## 2023-07-31 DIAGNOSIS — I110 Hypertensive heart disease with heart failure: Secondary | ICD-10-CM | POA: Diagnosis not present

## 2023-07-31 DIAGNOSIS — G20A1 Parkinson's disease without dyskinesia, without mention of fluctuations: Secondary | ICD-10-CM | POA: Diagnosis not present

## 2023-07-31 DIAGNOSIS — R630 Anorexia: Secondary | ICD-10-CM | POA: Diagnosis not present

## 2023-07-31 DIAGNOSIS — G8194 Hemiplegia, unspecified affecting left nondominant side: Secondary | ICD-10-CM | POA: Diagnosis not present

## 2023-07-31 DIAGNOSIS — E119 Type 2 diabetes mellitus without complications: Secondary | ICD-10-CM | POA: Diagnosis not present

## 2023-07-31 DIAGNOSIS — I951 Orthostatic hypotension: Secondary | ICD-10-CM | POA: Diagnosis not present

## 2023-08-02 DIAGNOSIS — R000 Tachycardia, unspecified: Secondary | ICD-10-CM | POA: Diagnosis not present

## 2023-08-02 DIAGNOSIS — R630 Anorexia: Secondary | ICD-10-CM | POA: Diagnosis not present

## 2023-08-02 DIAGNOSIS — I110 Hypertensive heart disease with heart failure: Secondary | ICD-10-CM | POA: Diagnosis not present

## 2023-08-02 DIAGNOSIS — G20A1 Parkinson's disease without dyskinesia, without mention of fluctuations: Secondary | ICD-10-CM | POA: Diagnosis not present

## 2023-08-02 DIAGNOSIS — M479 Spondylosis, unspecified: Secondary | ICD-10-CM | POA: Diagnosis not present

## 2023-08-02 DIAGNOSIS — I951 Orthostatic hypotension: Secondary | ICD-10-CM | POA: Diagnosis not present

## 2023-08-02 DIAGNOSIS — E785 Hyperlipidemia, unspecified: Secondary | ICD-10-CM | POA: Diagnosis not present

## 2023-08-02 DIAGNOSIS — K219 Gastro-esophageal reflux disease without esophagitis: Secondary | ICD-10-CM | POA: Diagnosis not present

## 2023-08-02 DIAGNOSIS — E119 Type 2 diabetes mellitus without complications: Secondary | ICD-10-CM | POA: Diagnosis not present

## 2023-08-02 DIAGNOSIS — I509 Heart failure, unspecified: Secondary | ICD-10-CM | POA: Diagnosis not present

## 2023-08-02 DIAGNOSIS — I428 Other cardiomyopathies: Secondary | ICD-10-CM | POA: Diagnosis not present

## 2023-08-02 DIAGNOSIS — G8194 Hemiplegia, unspecified affecting left nondominant side: Secondary | ICD-10-CM | POA: Diagnosis not present

## 2023-08-03 DIAGNOSIS — G8194 Hemiplegia, unspecified affecting left nondominant side: Secondary | ICD-10-CM | POA: Diagnosis not present

## 2023-08-03 DIAGNOSIS — I110 Hypertensive heart disease with heart failure: Secondary | ICD-10-CM | POA: Diagnosis not present

## 2023-08-03 DIAGNOSIS — R630 Anorexia: Secondary | ICD-10-CM | POA: Diagnosis not present

## 2023-08-03 DIAGNOSIS — G20A1 Parkinson's disease without dyskinesia, without mention of fluctuations: Secondary | ICD-10-CM | POA: Diagnosis not present

## 2023-08-03 DIAGNOSIS — I951 Orthostatic hypotension: Secondary | ICD-10-CM | POA: Diagnosis not present

## 2023-08-03 DIAGNOSIS — E119 Type 2 diabetes mellitus without complications: Secondary | ICD-10-CM | POA: Diagnosis not present

## 2023-08-04 DIAGNOSIS — I110 Hypertensive heart disease with heart failure: Secondary | ICD-10-CM | POA: Diagnosis not present

## 2023-08-04 DIAGNOSIS — E119 Type 2 diabetes mellitus without complications: Secondary | ICD-10-CM | POA: Diagnosis not present

## 2023-08-04 DIAGNOSIS — I951 Orthostatic hypotension: Secondary | ICD-10-CM | POA: Diagnosis not present

## 2023-08-04 DIAGNOSIS — G20A1 Parkinson's disease without dyskinesia, without mention of fluctuations: Secondary | ICD-10-CM | POA: Diagnosis not present

## 2023-08-04 DIAGNOSIS — R630 Anorexia: Secondary | ICD-10-CM | POA: Diagnosis not present

## 2023-08-04 DIAGNOSIS — G8194 Hemiplegia, unspecified affecting left nondominant side: Secondary | ICD-10-CM | POA: Diagnosis not present

## 2023-08-07 DIAGNOSIS — G8194 Hemiplegia, unspecified affecting left nondominant side: Secondary | ICD-10-CM | POA: Diagnosis not present

## 2023-08-07 DIAGNOSIS — G20A1 Parkinson's disease without dyskinesia, without mention of fluctuations: Secondary | ICD-10-CM | POA: Diagnosis not present

## 2023-08-07 DIAGNOSIS — E119 Type 2 diabetes mellitus without complications: Secondary | ICD-10-CM | POA: Diagnosis not present

## 2023-08-07 DIAGNOSIS — I110 Hypertensive heart disease with heart failure: Secondary | ICD-10-CM | POA: Diagnosis not present

## 2023-08-07 DIAGNOSIS — I951 Orthostatic hypotension: Secondary | ICD-10-CM | POA: Diagnosis not present

## 2023-08-07 DIAGNOSIS — R630 Anorexia: Secondary | ICD-10-CM | POA: Diagnosis not present

## 2023-08-09 DIAGNOSIS — I951 Orthostatic hypotension: Secondary | ICD-10-CM | POA: Diagnosis not present

## 2023-08-09 DIAGNOSIS — R630 Anorexia: Secondary | ICD-10-CM | POA: Diagnosis not present

## 2023-08-09 DIAGNOSIS — I110 Hypertensive heart disease with heart failure: Secondary | ICD-10-CM | POA: Diagnosis not present

## 2023-08-09 DIAGNOSIS — G8194 Hemiplegia, unspecified affecting left nondominant side: Secondary | ICD-10-CM | POA: Diagnosis not present

## 2023-08-09 DIAGNOSIS — E119 Type 2 diabetes mellitus without complications: Secondary | ICD-10-CM | POA: Diagnosis not present

## 2023-08-09 DIAGNOSIS — G20A1 Parkinson's disease without dyskinesia, without mention of fluctuations: Secondary | ICD-10-CM | POA: Diagnosis not present

## 2023-08-11 DIAGNOSIS — R630 Anorexia: Secondary | ICD-10-CM | POA: Diagnosis not present

## 2023-08-11 DIAGNOSIS — I110 Hypertensive heart disease with heart failure: Secondary | ICD-10-CM | POA: Diagnosis not present

## 2023-08-11 DIAGNOSIS — I951 Orthostatic hypotension: Secondary | ICD-10-CM | POA: Diagnosis not present

## 2023-08-11 DIAGNOSIS — G20A1 Parkinson's disease without dyskinesia, without mention of fluctuations: Secondary | ICD-10-CM | POA: Diagnosis not present

## 2023-08-11 DIAGNOSIS — G8194 Hemiplegia, unspecified affecting left nondominant side: Secondary | ICD-10-CM | POA: Diagnosis not present

## 2023-08-11 DIAGNOSIS — E119 Type 2 diabetes mellitus without complications: Secondary | ICD-10-CM | POA: Diagnosis not present

## 2023-08-14 DIAGNOSIS — I951 Orthostatic hypotension: Secondary | ICD-10-CM | POA: Diagnosis not present

## 2023-08-14 DIAGNOSIS — G20A1 Parkinson's disease without dyskinesia, without mention of fluctuations: Secondary | ICD-10-CM | POA: Diagnosis not present

## 2023-08-14 DIAGNOSIS — R630 Anorexia: Secondary | ICD-10-CM | POA: Diagnosis not present

## 2023-08-14 DIAGNOSIS — E119 Type 2 diabetes mellitus without complications: Secondary | ICD-10-CM | POA: Diagnosis not present

## 2023-08-14 DIAGNOSIS — I110 Hypertensive heart disease with heart failure: Secondary | ICD-10-CM | POA: Diagnosis not present

## 2023-08-14 DIAGNOSIS — G8194 Hemiplegia, unspecified affecting left nondominant side: Secondary | ICD-10-CM | POA: Diagnosis not present

## 2023-08-16 DIAGNOSIS — R630 Anorexia: Secondary | ICD-10-CM | POA: Diagnosis not present

## 2023-08-16 DIAGNOSIS — G20A1 Parkinson's disease without dyskinesia, without mention of fluctuations: Secondary | ICD-10-CM | POA: Diagnosis not present

## 2023-08-16 DIAGNOSIS — I110 Hypertensive heart disease with heart failure: Secondary | ICD-10-CM | POA: Diagnosis not present

## 2023-08-16 DIAGNOSIS — E119 Type 2 diabetes mellitus without complications: Secondary | ICD-10-CM | POA: Diagnosis not present

## 2023-08-16 DIAGNOSIS — G8194 Hemiplegia, unspecified affecting left nondominant side: Secondary | ICD-10-CM | POA: Diagnosis not present

## 2023-08-16 DIAGNOSIS — I951 Orthostatic hypotension: Secondary | ICD-10-CM | POA: Diagnosis not present

## 2023-08-17 DIAGNOSIS — I951 Orthostatic hypotension: Secondary | ICD-10-CM | POA: Diagnosis not present

## 2023-08-17 DIAGNOSIS — I110 Hypertensive heart disease with heart failure: Secondary | ICD-10-CM | POA: Diagnosis not present

## 2023-08-17 DIAGNOSIS — G8194 Hemiplegia, unspecified affecting left nondominant side: Secondary | ICD-10-CM | POA: Diagnosis not present

## 2023-08-17 DIAGNOSIS — R630 Anorexia: Secondary | ICD-10-CM | POA: Diagnosis not present

## 2023-08-17 DIAGNOSIS — E119 Type 2 diabetes mellitus without complications: Secondary | ICD-10-CM | POA: Diagnosis not present

## 2023-08-17 DIAGNOSIS — G20A1 Parkinson's disease without dyskinesia, without mention of fluctuations: Secondary | ICD-10-CM | POA: Diagnosis not present

## 2023-08-18 DIAGNOSIS — G20A1 Parkinson's disease without dyskinesia, without mention of fluctuations: Secondary | ICD-10-CM | POA: Diagnosis not present

## 2023-08-18 DIAGNOSIS — I951 Orthostatic hypotension: Secondary | ICD-10-CM | POA: Diagnosis not present

## 2023-08-18 DIAGNOSIS — R630 Anorexia: Secondary | ICD-10-CM | POA: Diagnosis not present

## 2023-08-18 DIAGNOSIS — G8194 Hemiplegia, unspecified affecting left nondominant side: Secondary | ICD-10-CM | POA: Diagnosis not present

## 2023-08-18 DIAGNOSIS — I110 Hypertensive heart disease with heart failure: Secondary | ICD-10-CM | POA: Diagnosis not present

## 2023-08-18 DIAGNOSIS — E119 Type 2 diabetes mellitus without complications: Secondary | ICD-10-CM | POA: Diagnosis not present

## 2023-08-23 DIAGNOSIS — G8194 Hemiplegia, unspecified affecting left nondominant side: Secondary | ICD-10-CM | POA: Diagnosis not present

## 2023-08-23 DIAGNOSIS — R630 Anorexia: Secondary | ICD-10-CM | POA: Diagnosis not present

## 2023-08-23 DIAGNOSIS — I110 Hypertensive heart disease with heart failure: Secondary | ICD-10-CM | POA: Diagnosis not present

## 2023-08-23 DIAGNOSIS — E119 Type 2 diabetes mellitus without complications: Secondary | ICD-10-CM | POA: Diagnosis not present

## 2023-08-23 DIAGNOSIS — G20A1 Parkinson's disease without dyskinesia, without mention of fluctuations: Secondary | ICD-10-CM | POA: Diagnosis not present

## 2023-08-23 DIAGNOSIS — I951 Orthostatic hypotension: Secondary | ICD-10-CM | POA: Diagnosis not present

## 2023-08-24 ENCOUNTER — Ambulatory Visit (INDEPENDENT_AMBULATORY_CARE_PROVIDER_SITE_OTHER): Payer: Medicare Other

## 2023-08-24 DIAGNOSIS — I428 Other cardiomyopathies: Secondary | ICD-10-CM

## 2023-08-24 DIAGNOSIS — E119 Type 2 diabetes mellitus without complications: Secondary | ICD-10-CM | POA: Diagnosis not present

## 2023-08-24 DIAGNOSIS — G8194 Hemiplegia, unspecified affecting left nondominant side: Secondary | ICD-10-CM | POA: Diagnosis not present

## 2023-08-24 DIAGNOSIS — I110 Hypertensive heart disease with heart failure: Secondary | ICD-10-CM | POA: Diagnosis not present

## 2023-08-24 DIAGNOSIS — I951 Orthostatic hypotension: Secondary | ICD-10-CM | POA: Diagnosis not present

## 2023-08-24 DIAGNOSIS — G20A1 Parkinson's disease without dyskinesia, without mention of fluctuations: Secondary | ICD-10-CM | POA: Diagnosis not present

## 2023-08-24 DIAGNOSIS — R630 Anorexia: Secondary | ICD-10-CM | POA: Diagnosis not present

## 2023-08-24 LAB — CUP PACEART REMOTE DEVICE CHECK
Battery Remaining Longevity: 6 mo
Battery Voltage: 2.84 V
Brady Statistic AP VP Percent: 0 %
Brady Statistic AP VS Percent: 0 %
Brady Statistic AS VP Percent: 0.03 %
Brady Statistic AS VS Percent: 99.96 %
Brady Statistic RA Percent Paced: 0.01 %
Brady Statistic RV Percent Paced: 0.03 %
Date Time Interrogation Session: 20250123074226
HighPow Impedance: 66 Ohm
Implantable Lead Connection Status: 753985
Implantable Lead Connection Status: 753985
Implantable Lead Implant Date: 20150427
Implantable Lead Implant Date: 20150427
Implantable Lead Location: 753859
Implantable Lead Location: 753860
Implantable Lead Model: 5076
Implantable Lead Model: 6935
Implantable Pulse Generator Implant Date: 20150427
Lead Channel Impedance Value: 361 Ohm
Lead Channel Impedance Value: 361 Ohm
Lead Channel Impedance Value: 4047 Ohm
Lead Channel Impedance Value: 4047 Ohm
Lead Channel Impedance Value: 4047 Ohm
Lead Channel Impedance Value: 456 Ohm
Lead Channel Pacing Threshold Amplitude: 0.625 V
Lead Channel Pacing Threshold Amplitude: 0.75 V
Lead Channel Pacing Threshold Pulse Width: 0.4 ms
Lead Channel Pacing Threshold Pulse Width: 0.4 ms
Lead Channel Sensing Intrinsic Amplitude: 1.375 mV
Lead Channel Sensing Intrinsic Amplitude: 1.375 mV
Lead Channel Sensing Intrinsic Amplitude: 21.375 mV
Lead Channel Sensing Intrinsic Amplitude: 21.375 mV
Lead Channel Setting Pacing Amplitude: 2 V
Lead Channel Setting Pacing Amplitude: 2.5 V
Lead Channel Setting Pacing Pulse Width: 0.4 ms
Lead Channel Setting Sensing Sensitivity: 0.3 mV
Zone Setting Status: 755011
Zone Setting Status: 755011
Zone Setting Status: 755011

## 2023-08-25 ENCOUNTER — Encounter: Payer: Self-pay | Admitting: Internal Medicine

## 2023-08-25 DIAGNOSIS — R630 Anorexia: Secondary | ICD-10-CM | POA: Diagnosis not present

## 2023-08-25 DIAGNOSIS — G8194 Hemiplegia, unspecified affecting left nondominant side: Secondary | ICD-10-CM | POA: Diagnosis not present

## 2023-08-25 DIAGNOSIS — I951 Orthostatic hypotension: Secondary | ICD-10-CM | POA: Diagnosis not present

## 2023-08-25 DIAGNOSIS — G20A1 Parkinson's disease without dyskinesia, without mention of fluctuations: Secondary | ICD-10-CM | POA: Diagnosis not present

## 2023-08-25 DIAGNOSIS — I110 Hypertensive heart disease with heart failure: Secondary | ICD-10-CM | POA: Diagnosis not present

## 2023-08-25 DIAGNOSIS — E119 Type 2 diabetes mellitus without complications: Secondary | ICD-10-CM | POA: Diagnosis not present

## 2023-08-28 DIAGNOSIS — R630 Anorexia: Secondary | ICD-10-CM | POA: Diagnosis not present

## 2023-08-28 DIAGNOSIS — E119 Type 2 diabetes mellitus without complications: Secondary | ICD-10-CM | POA: Diagnosis not present

## 2023-08-28 DIAGNOSIS — G8194 Hemiplegia, unspecified affecting left nondominant side: Secondary | ICD-10-CM | POA: Diagnosis not present

## 2023-08-28 DIAGNOSIS — I110 Hypertensive heart disease with heart failure: Secondary | ICD-10-CM | POA: Diagnosis not present

## 2023-08-28 DIAGNOSIS — G20A1 Parkinson's disease without dyskinesia, without mention of fluctuations: Secondary | ICD-10-CM | POA: Diagnosis not present

## 2023-08-28 DIAGNOSIS — I951 Orthostatic hypotension: Secondary | ICD-10-CM | POA: Diagnosis not present

## 2023-08-29 DIAGNOSIS — I110 Hypertensive heart disease with heart failure: Secondary | ICD-10-CM | POA: Diagnosis not present

## 2023-08-29 DIAGNOSIS — E119 Type 2 diabetes mellitus without complications: Secondary | ICD-10-CM | POA: Diagnosis not present

## 2023-08-29 DIAGNOSIS — G8194 Hemiplegia, unspecified affecting left nondominant side: Secondary | ICD-10-CM | POA: Diagnosis not present

## 2023-08-29 DIAGNOSIS — I951 Orthostatic hypotension: Secondary | ICD-10-CM | POA: Diagnosis not present

## 2023-08-29 DIAGNOSIS — G20A1 Parkinson's disease without dyskinesia, without mention of fluctuations: Secondary | ICD-10-CM | POA: Diagnosis not present

## 2023-08-29 DIAGNOSIS — R630 Anorexia: Secondary | ICD-10-CM | POA: Diagnosis not present

## 2023-08-30 DIAGNOSIS — G20A1 Parkinson's disease without dyskinesia, without mention of fluctuations: Secondary | ICD-10-CM | POA: Diagnosis not present

## 2023-08-30 DIAGNOSIS — E119 Type 2 diabetes mellitus without complications: Secondary | ICD-10-CM | POA: Diagnosis not present

## 2023-08-30 DIAGNOSIS — I951 Orthostatic hypotension: Secondary | ICD-10-CM | POA: Diagnosis not present

## 2023-08-30 DIAGNOSIS — R630 Anorexia: Secondary | ICD-10-CM | POA: Diagnosis not present

## 2023-08-30 DIAGNOSIS — G8194 Hemiplegia, unspecified affecting left nondominant side: Secondary | ICD-10-CM | POA: Diagnosis not present

## 2023-08-30 DIAGNOSIS — I110 Hypertensive heart disease with heart failure: Secondary | ICD-10-CM | POA: Diagnosis not present

## 2023-09-01 DIAGNOSIS — I110 Hypertensive heart disease with heart failure: Secondary | ICD-10-CM | POA: Diagnosis not present

## 2023-09-01 DIAGNOSIS — G20A1 Parkinson's disease without dyskinesia, without mention of fluctuations: Secondary | ICD-10-CM | POA: Diagnosis not present

## 2023-09-01 DIAGNOSIS — G8194 Hemiplegia, unspecified affecting left nondominant side: Secondary | ICD-10-CM | POA: Diagnosis not present

## 2023-09-01 DIAGNOSIS — E119 Type 2 diabetes mellitus without complications: Secondary | ICD-10-CM | POA: Diagnosis not present

## 2023-09-01 DIAGNOSIS — I951 Orthostatic hypotension: Secondary | ICD-10-CM | POA: Diagnosis not present

## 2023-09-01 DIAGNOSIS — R630 Anorexia: Secondary | ICD-10-CM | POA: Diagnosis not present

## 2023-09-02 DIAGNOSIS — E119 Type 2 diabetes mellitus without complications: Secondary | ICD-10-CM | POA: Diagnosis not present

## 2023-09-02 DIAGNOSIS — R000 Tachycardia, unspecified: Secondary | ICD-10-CM | POA: Diagnosis not present

## 2023-09-02 DIAGNOSIS — G20A1 Parkinson's disease without dyskinesia, without mention of fluctuations: Secondary | ICD-10-CM | POA: Diagnosis not present

## 2023-09-02 DIAGNOSIS — E785 Hyperlipidemia, unspecified: Secondary | ICD-10-CM | POA: Diagnosis not present

## 2023-09-02 DIAGNOSIS — M479 Spondylosis, unspecified: Secondary | ICD-10-CM | POA: Diagnosis not present

## 2023-09-02 DIAGNOSIS — K219 Gastro-esophageal reflux disease without esophagitis: Secondary | ICD-10-CM | POA: Diagnosis not present

## 2023-09-02 DIAGNOSIS — I110 Hypertensive heart disease with heart failure: Secondary | ICD-10-CM | POA: Diagnosis not present

## 2023-09-02 DIAGNOSIS — G8194 Hemiplegia, unspecified affecting left nondominant side: Secondary | ICD-10-CM | POA: Diagnosis not present

## 2023-09-02 DIAGNOSIS — I428 Other cardiomyopathies: Secondary | ICD-10-CM | POA: Diagnosis not present

## 2023-09-02 DIAGNOSIS — R630 Anorexia: Secondary | ICD-10-CM | POA: Diagnosis not present

## 2023-09-02 DIAGNOSIS — I951 Orthostatic hypotension: Secondary | ICD-10-CM | POA: Diagnosis not present

## 2023-09-02 DIAGNOSIS — I509 Heart failure, unspecified: Secondary | ICD-10-CM | POA: Diagnosis not present

## 2023-09-04 DIAGNOSIS — R630 Anorexia: Secondary | ICD-10-CM | POA: Diagnosis not present

## 2023-09-04 DIAGNOSIS — E119 Type 2 diabetes mellitus without complications: Secondary | ICD-10-CM | POA: Diagnosis not present

## 2023-09-04 DIAGNOSIS — G20A1 Parkinson's disease without dyskinesia, without mention of fluctuations: Secondary | ICD-10-CM | POA: Diagnosis not present

## 2023-09-04 DIAGNOSIS — I951 Orthostatic hypotension: Secondary | ICD-10-CM | POA: Diagnosis not present

## 2023-09-04 DIAGNOSIS — I110 Hypertensive heart disease with heart failure: Secondary | ICD-10-CM | POA: Diagnosis not present

## 2023-09-04 DIAGNOSIS — G8194 Hemiplegia, unspecified affecting left nondominant side: Secondary | ICD-10-CM | POA: Diagnosis not present

## 2023-09-06 DIAGNOSIS — G20A1 Parkinson's disease without dyskinesia, without mention of fluctuations: Secondary | ICD-10-CM | POA: Diagnosis not present

## 2023-09-06 DIAGNOSIS — I110 Hypertensive heart disease with heart failure: Secondary | ICD-10-CM | POA: Diagnosis not present

## 2023-09-06 DIAGNOSIS — I951 Orthostatic hypotension: Secondary | ICD-10-CM | POA: Diagnosis not present

## 2023-09-06 DIAGNOSIS — R630 Anorexia: Secondary | ICD-10-CM | POA: Diagnosis not present

## 2023-09-06 DIAGNOSIS — G8194 Hemiplegia, unspecified affecting left nondominant side: Secondary | ICD-10-CM | POA: Diagnosis not present

## 2023-09-06 DIAGNOSIS — E119 Type 2 diabetes mellitus without complications: Secondary | ICD-10-CM | POA: Diagnosis not present

## 2023-09-07 DIAGNOSIS — E119 Type 2 diabetes mellitus without complications: Secondary | ICD-10-CM | POA: Diagnosis not present

## 2023-09-07 DIAGNOSIS — I951 Orthostatic hypotension: Secondary | ICD-10-CM | POA: Diagnosis not present

## 2023-09-07 DIAGNOSIS — G20A1 Parkinson's disease without dyskinesia, without mention of fluctuations: Secondary | ICD-10-CM | POA: Diagnosis not present

## 2023-09-07 DIAGNOSIS — I110 Hypertensive heart disease with heart failure: Secondary | ICD-10-CM | POA: Diagnosis not present

## 2023-09-07 DIAGNOSIS — G8194 Hemiplegia, unspecified affecting left nondominant side: Secondary | ICD-10-CM | POA: Diagnosis not present

## 2023-09-07 DIAGNOSIS — R630 Anorexia: Secondary | ICD-10-CM | POA: Diagnosis not present

## 2023-09-11 DIAGNOSIS — I110 Hypertensive heart disease with heart failure: Secondary | ICD-10-CM | POA: Diagnosis not present

## 2023-09-11 DIAGNOSIS — G8194 Hemiplegia, unspecified affecting left nondominant side: Secondary | ICD-10-CM | POA: Diagnosis not present

## 2023-09-11 DIAGNOSIS — I951 Orthostatic hypotension: Secondary | ICD-10-CM | POA: Diagnosis not present

## 2023-09-11 DIAGNOSIS — G20A1 Parkinson's disease without dyskinesia, without mention of fluctuations: Secondary | ICD-10-CM | POA: Diagnosis not present

## 2023-09-11 DIAGNOSIS — E119 Type 2 diabetes mellitus without complications: Secondary | ICD-10-CM | POA: Diagnosis not present

## 2023-09-11 DIAGNOSIS — R630 Anorexia: Secondary | ICD-10-CM | POA: Diagnosis not present

## 2023-09-13 DIAGNOSIS — I110 Hypertensive heart disease with heart failure: Secondary | ICD-10-CM | POA: Diagnosis not present

## 2023-09-13 DIAGNOSIS — E119 Type 2 diabetes mellitus without complications: Secondary | ICD-10-CM | POA: Diagnosis not present

## 2023-09-13 DIAGNOSIS — G20A1 Parkinson's disease without dyskinesia, without mention of fluctuations: Secondary | ICD-10-CM | POA: Diagnosis not present

## 2023-09-13 DIAGNOSIS — R630 Anorexia: Secondary | ICD-10-CM | POA: Diagnosis not present

## 2023-09-13 DIAGNOSIS — I951 Orthostatic hypotension: Secondary | ICD-10-CM | POA: Diagnosis not present

## 2023-09-13 DIAGNOSIS — G8194 Hemiplegia, unspecified affecting left nondominant side: Secondary | ICD-10-CM | POA: Diagnosis not present

## 2023-09-14 DIAGNOSIS — R630 Anorexia: Secondary | ICD-10-CM | POA: Diagnosis not present

## 2023-09-14 DIAGNOSIS — I110 Hypertensive heart disease with heart failure: Secondary | ICD-10-CM | POA: Diagnosis not present

## 2023-09-14 DIAGNOSIS — I951 Orthostatic hypotension: Secondary | ICD-10-CM | POA: Diagnosis not present

## 2023-09-14 DIAGNOSIS — E119 Type 2 diabetes mellitus without complications: Secondary | ICD-10-CM | POA: Diagnosis not present

## 2023-09-14 DIAGNOSIS — G8194 Hemiplegia, unspecified affecting left nondominant side: Secondary | ICD-10-CM | POA: Diagnosis not present

## 2023-09-14 DIAGNOSIS — G20A1 Parkinson's disease without dyskinesia, without mention of fluctuations: Secondary | ICD-10-CM | POA: Diagnosis not present

## 2023-09-15 DIAGNOSIS — G20A1 Parkinson's disease without dyskinesia, without mention of fluctuations: Secondary | ICD-10-CM | POA: Diagnosis not present

## 2023-09-15 DIAGNOSIS — E119 Type 2 diabetes mellitus without complications: Secondary | ICD-10-CM | POA: Diagnosis not present

## 2023-09-15 DIAGNOSIS — G8194 Hemiplegia, unspecified affecting left nondominant side: Secondary | ICD-10-CM | POA: Diagnosis not present

## 2023-09-15 DIAGNOSIS — I110 Hypertensive heart disease with heart failure: Secondary | ICD-10-CM | POA: Diagnosis not present

## 2023-09-15 DIAGNOSIS — R630 Anorexia: Secondary | ICD-10-CM | POA: Diagnosis not present

## 2023-09-15 DIAGNOSIS — I951 Orthostatic hypotension: Secondary | ICD-10-CM | POA: Diagnosis not present

## 2023-09-18 DIAGNOSIS — G20A1 Parkinson's disease without dyskinesia, without mention of fluctuations: Secondary | ICD-10-CM | POA: Diagnosis not present

## 2023-09-18 DIAGNOSIS — I110 Hypertensive heart disease with heart failure: Secondary | ICD-10-CM | POA: Diagnosis not present

## 2023-09-18 DIAGNOSIS — I951 Orthostatic hypotension: Secondary | ICD-10-CM | POA: Diagnosis not present

## 2023-09-18 DIAGNOSIS — G8194 Hemiplegia, unspecified affecting left nondominant side: Secondary | ICD-10-CM | POA: Diagnosis not present

## 2023-09-18 DIAGNOSIS — E119 Type 2 diabetes mellitus without complications: Secondary | ICD-10-CM | POA: Diagnosis not present

## 2023-09-18 DIAGNOSIS — R630 Anorexia: Secondary | ICD-10-CM | POA: Diagnosis not present

## 2023-09-20 DIAGNOSIS — R630 Anorexia: Secondary | ICD-10-CM | POA: Diagnosis not present

## 2023-09-20 DIAGNOSIS — G20A1 Parkinson's disease without dyskinesia, without mention of fluctuations: Secondary | ICD-10-CM | POA: Diagnosis not present

## 2023-09-20 DIAGNOSIS — G8194 Hemiplegia, unspecified affecting left nondominant side: Secondary | ICD-10-CM | POA: Diagnosis not present

## 2023-09-20 DIAGNOSIS — I951 Orthostatic hypotension: Secondary | ICD-10-CM | POA: Diagnosis not present

## 2023-09-20 DIAGNOSIS — E119 Type 2 diabetes mellitus without complications: Secondary | ICD-10-CM | POA: Diagnosis not present

## 2023-09-20 DIAGNOSIS — I110 Hypertensive heart disease with heart failure: Secondary | ICD-10-CM | POA: Diagnosis not present

## 2023-09-22 DIAGNOSIS — I110 Hypertensive heart disease with heart failure: Secondary | ICD-10-CM | POA: Diagnosis not present

## 2023-09-22 DIAGNOSIS — G8194 Hemiplegia, unspecified affecting left nondominant side: Secondary | ICD-10-CM | POA: Diagnosis not present

## 2023-09-22 DIAGNOSIS — E119 Type 2 diabetes mellitus without complications: Secondary | ICD-10-CM | POA: Diagnosis not present

## 2023-09-22 DIAGNOSIS — R630 Anorexia: Secondary | ICD-10-CM | POA: Diagnosis not present

## 2023-09-22 DIAGNOSIS — I951 Orthostatic hypotension: Secondary | ICD-10-CM | POA: Diagnosis not present

## 2023-09-22 DIAGNOSIS — G20A1 Parkinson's disease without dyskinesia, without mention of fluctuations: Secondary | ICD-10-CM | POA: Diagnosis not present

## 2023-09-25 ENCOUNTER — Ambulatory Visit (INDEPENDENT_AMBULATORY_CARE_PROVIDER_SITE_OTHER): Payer: Medicare Other

## 2023-09-25 DIAGNOSIS — I110 Hypertensive heart disease with heart failure: Secondary | ICD-10-CM | POA: Diagnosis not present

## 2023-09-25 DIAGNOSIS — G20A1 Parkinson's disease without dyskinesia, without mention of fluctuations: Secondary | ICD-10-CM | POA: Diagnosis not present

## 2023-09-25 DIAGNOSIS — G8194 Hemiplegia, unspecified affecting left nondominant side: Secondary | ICD-10-CM | POA: Diagnosis not present

## 2023-09-25 DIAGNOSIS — I951 Orthostatic hypotension: Secondary | ICD-10-CM | POA: Diagnosis not present

## 2023-09-25 DIAGNOSIS — R630 Anorexia: Secondary | ICD-10-CM | POA: Diagnosis not present

## 2023-09-25 DIAGNOSIS — I428 Other cardiomyopathies: Secondary | ICD-10-CM

## 2023-09-25 DIAGNOSIS — I5022 Chronic systolic (congestive) heart failure: Secondary | ICD-10-CM

## 2023-09-25 DIAGNOSIS — E119 Type 2 diabetes mellitus without complications: Secondary | ICD-10-CM | POA: Diagnosis not present

## 2023-09-25 LAB — CUP PACEART REMOTE DEVICE CHECK
Battery Remaining Longevity: 3 mo
Battery Voltage: 2.81 V
Brady Statistic AP VP Percent: 0 %
Brady Statistic AP VS Percent: 0 %
Brady Statistic AS VP Percent: 0.03 %
Brady Statistic AS VS Percent: 99.96 %
Brady Statistic RA Percent Paced: 0.01 %
Brady Statistic RV Percent Paced: 0.03 %
Date Time Interrogation Session: 20250224001604
HighPow Impedance: 66 Ohm
Implantable Lead Connection Status: 753985
Implantable Lead Connection Status: 753985
Implantable Lead Implant Date: 20150427
Implantable Lead Implant Date: 20150427
Implantable Lead Location: 753859
Implantable Lead Location: 753860
Implantable Lead Model: 5076
Implantable Lead Model: 6935
Implantable Pulse Generator Implant Date: 20150427
Lead Channel Impedance Value: 361 Ohm
Lead Channel Impedance Value: 361 Ohm
Lead Channel Impedance Value: 4047 Ohm
Lead Channel Impedance Value: 4047 Ohm
Lead Channel Impedance Value: 4047 Ohm
Lead Channel Impedance Value: 456 Ohm
Lead Channel Pacing Threshold Amplitude: 0.5 V
Lead Channel Pacing Threshold Amplitude: 0.75 V
Lead Channel Pacing Threshold Pulse Width: 0.4 ms
Lead Channel Pacing Threshold Pulse Width: 0.4 ms
Lead Channel Sensing Intrinsic Amplitude: 1.625 mV
Lead Channel Sensing Intrinsic Amplitude: 1.625 mV
Lead Channel Sensing Intrinsic Amplitude: 22.375 mV
Lead Channel Sensing Intrinsic Amplitude: 22.375 mV
Lead Channel Setting Pacing Amplitude: 2 V
Lead Channel Setting Pacing Amplitude: 2.5 V
Lead Channel Setting Pacing Pulse Width: 0.4 ms
Lead Channel Setting Sensing Sensitivity: 0.3 mV
Zone Setting Status: 755011
Zone Setting Status: 755011
Zone Setting Status: 755011

## 2023-09-26 ENCOUNTER — Encounter: Payer: Self-pay | Admitting: Internal Medicine

## 2023-09-27 DIAGNOSIS — R630 Anorexia: Secondary | ICD-10-CM | POA: Diagnosis not present

## 2023-09-27 DIAGNOSIS — G8194 Hemiplegia, unspecified affecting left nondominant side: Secondary | ICD-10-CM | POA: Diagnosis not present

## 2023-09-27 DIAGNOSIS — G20A1 Parkinson's disease without dyskinesia, without mention of fluctuations: Secondary | ICD-10-CM | POA: Diagnosis not present

## 2023-09-27 DIAGNOSIS — E119 Type 2 diabetes mellitus without complications: Secondary | ICD-10-CM | POA: Diagnosis not present

## 2023-09-27 DIAGNOSIS — I110 Hypertensive heart disease with heart failure: Secondary | ICD-10-CM | POA: Diagnosis not present

## 2023-09-27 DIAGNOSIS — I951 Orthostatic hypotension: Secondary | ICD-10-CM | POA: Diagnosis not present

## 2023-09-28 DIAGNOSIS — I110 Hypertensive heart disease with heart failure: Secondary | ICD-10-CM | POA: Diagnosis not present

## 2023-09-28 DIAGNOSIS — G8194 Hemiplegia, unspecified affecting left nondominant side: Secondary | ICD-10-CM | POA: Diagnosis not present

## 2023-09-28 DIAGNOSIS — E119 Type 2 diabetes mellitus without complications: Secondary | ICD-10-CM | POA: Diagnosis not present

## 2023-09-28 DIAGNOSIS — R630 Anorexia: Secondary | ICD-10-CM | POA: Diagnosis not present

## 2023-09-28 DIAGNOSIS — G20A1 Parkinson's disease without dyskinesia, without mention of fluctuations: Secondary | ICD-10-CM | POA: Diagnosis not present

## 2023-09-28 DIAGNOSIS — I951 Orthostatic hypotension: Secondary | ICD-10-CM | POA: Diagnosis not present

## 2023-09-29 DIAGNOSIS — R630 Anorexia: Secondary | ICD-10-CM | POA: Diagnosis not present

## 2023-09-29 DIAGNOSIS — I951 Orthostatic hypotension: Secondary | ICD-10-CM | POA: Diagnosis not present

## 2023-09-29 DIAGNOSIS — G20A1 Parkinson's disease without dyskinesia, without mention of fluctuations: Secondary | ICD-10-CM | POA: Diagnosis not present

## 2023-09-29 DIAGNOSIS — E119 Type 2 diabetes mellitus without complications: Secondary | ICD-10-CM | POA: Diagnosis not present

## 2023-09-29 DIAGNOSIS — I110 Hypertensive heart disease with heart failure: Secondary | ICD-10-CM | POA: Diagnosis not present

## 2023-09-29 DIAGNOSIS — G8194 Hemiplegia, unspecified affecting left nondominant side: Secondary | ICD-10-CM | POA: Diagnosis not present

## 2023-09-30 DIAGNOSIS — I509 Heart failure, unspecified: Secondary | ICD-10-CM | POA: Diagnosis not present

## 2023-09-30 DIAGNOSIS — I951 Orthostatic hypotension: Secondary | ICD-10-CM | POA: Diagnosis not present

## 2023-09-30 DIAGNOSIS — I110 Hypertensive heart disease with heart failure: Secondary | ICD-10-CM | POA: Diagnosis not present

## 2023-09-30 DIAGNOSIS — E785 Hyperlipidemia, unspecified: Secondary | ICD-10-CM | POA: Diagnosis not present

## 2023-09-30 DIAGNOSIS — G20A1 Parkinson's disease without dyskinesia, without mention of fluctuations: Secondary | ICD-10-CM | POA: Diagnosis not present

## 2023-09-30 DIAGNOSIS — K219 Gastro-esophageal reflux disease without esophagitis: Secondary | ICD-10-CM | POA: Diagnosis not present

## 2023-09-30 DIAGNOSIS — R630 Anorexia: Secondary | ICD-10-CM | POA: Diagnosis not present

## 2023-09-30 DIAGNOSIS — R000 Tachycardia, unspecified: Secondary | ICD-10-CM | POA: Diagnosis not present

## 2023-09-30 DIAGNOSIS — I428 Other cardiomyopathies: Secondary | ICD-10-CM | POA: Diagnosis not present

## 2023-09-30 DIAGNOSIS — E119 Type 2 diabetes mellitus without complications: Secondary | ICD-10-CM | POA: Diagnosis not present

## 2023-09-30 DIAGNOSIS — G8194 Hemiplegia, unspecified affecting left nondominant side: Secondary | ICD-10-CM | POA: Diagnosis not present

## 2023-09-30 DIAGNOSIS — M479 Spondylosis, unspecified: Secondary | ICD-10-CM | POA: Diagnosis not present

## 2023-10-02 DIAGNOSIS — E119 Type 2 diabetes mellitus without complications: Secondary | ICD-10-CM | POA: Diagnosis not present

## 2023-10-02 DIAGNOSIS — G8194 Hemiplegia, unspecified affecting left nondominant side: Secondary | ICD-10-CM | POA: Diagnosis not present

## 2023-10-02 DIAGNOSIS — I951 Orthostatic hypotension: Secondary | ICD-10-CM | POA: Diagnosis not present

## 2023-10-02 DIAGNOSIS — I110 Hypertensive heart disease with heart failure: Secondary | ICD-10-CM | POA: Diagnosis not present

## 2023-10-02 DIAGNOSIS — R630 Anorexia: Secondary | ICD-10-CM | POA: Diagnosis not present

## 2023-10-02 DIAGNOSIS — G20A1 Parkinson's disease without dyskinesia, without mention of fluctuations: Secondary | ICD-10-CM | POA: Diagnosis not present

## 2023-10-04 DIAGNOSIS — I110 Hypertensive heart disease with heart failure: Secondary | ICD-10-CM | POA: Diagnosis not present

## 2023-10-04 DIAGNOSIS — G20A1 Parkinson's disease without dyskinesia, without mention of fluctuations: Secondary | ICD-10-CM | POA: Diagnosis not present

## 2023-10-04 DIAGNOSIS — I951 Orthostatic hypotension: Secondary | ICD-10-CM | POA: Diagnosis not present

## 2023-10-04 DIAGNOSIS — E119 Type 2 diabetes mellitus without complications: Secondary | ICD-10-CM | POA: Diagnosis not present

## 2023-10-04 DIAGNOSIS — G8194 Hemiplegia, unspecified affecting left nondominant side: Secondary | ICD-10-CM | POA: Diagnosis not present

## 2023-10-04 DIAGNOSIS — R630 Anorexia: Secondary | ICD-10-CM | POA: Diagnosis not present

## 2023-10-05 DIAGNOSIS — I951 Orthostatic hypotension: Secondary | ICD-10-CM | POA: Diagnosis not present

## 2023-10-05 DIAGNOSIS — R630 Anorexia: Secondary | ICD-10-CM | POA: Diagnosis not present

## 2023-10-05 DIAGNOSIS — E119 Type 2 diabetes mellitus without complications: Secondary | ICD-10-CM | POA: Diagnosis not present

## 2023-10-05 DIAGNOSIS — G20A1 Parkinson's disease without dyskinesia, without mention of fluctuations: Secondary | ICD-10-CM | POA: Diagnosis not present

## 2023-10-05 DIAGNOSIS — I110 Hypertensive heart disease with heart failure: Secondary | ICD-10-CM | POA: Diagnosis not present

## 2023-10-05 DIAGNOSIS — G8194 Hemiplegia, unspecified affecting left nondominant side: Secondary | ICD-10-CM | POA: Diagnosis not present

## 2023-10-05 NOTE — Progress Notes (Signed)
 Remote ICD transmission.

## 2023-10-06 DIAGNOSIS — G8194 Hemiplegia, unspecified affecting left nondominant side: Secondary | ICD-10-CM | POA: Diagnosis not present

## 2023-10-06 DIAGNOSIS — I951 Orthostatic hypotension: Secondary | ICD-10-CM | POA: Diagnosis not present

## 2023-10-06 DIAGNOSIS — E119 Type 2 diabetes mellitus without complications: Secondary | ICD-10-CM | POA: Diagnosis not present

## 2023-10-06 DIAGNOSIS — G20A1 Parkinson's disease without dyskinesia, without mention of fluctuations: Secondary | ICD-10-CM | POA: Diagnosis not present

## 2023-10-06 DIAGNOSIS — R630 Anorexia: Secondary | ICD-10-CM | POA: Diagnosis not present

## 2023-10-06 DIAGNOSIS — I110 Hypertensive heart disease with heart failure: Secondary | ICD-10-CM | POA: Diagnosis not present

## 2023-10-09 DIAGNOSIS — G8194 Hemiplegia, unspecified affecting left nondominant side: Secondary | ICD-10-CM | POA: Diagnosis not present

## 2023-10-09 DIAGNOSIS — I951 Orthostatic hypotension: Secondary | ICD-10-CM | POA: Diagnosis not present

## 2023-10-09 DIAGNOSIS — G20A1 Parkinson's disease without dyskinesia, without mention of fluctuations: Secondary | ICD-10-CM | POA: Diagnosis not present

## 2023-10-09 DIAGNOSIS — I110 Hypertensive heart disease with heart failure: Secondary | ICD-10-CM | POA: Diagnosis not present

## 2023-10-09 DIAGNOSIS — R630 Anorexia: Secondary | ICD-10-CM | POA: Diagnosis not present

## 2023-10-09 DIAGNOSIS — E119 Type 2 diabetes mellitus without complications: Secondary | ICD-10-CM | POA: Diagnosis not present

## 2023-10-10 DIAGNOSIS — E119 Type 2 diabetes mellitus without complications: Secondary | ICD-10-CM | POA: Diagnosis not present

## 2023-10-10 DIAGNOSIS — H5203 Hypermetropia, bilateral: Secondary | ICD-10-CM | POA: Diagnosis not present

## 2023-10-11 DIAGNOSIS — I110 Hypertensive heart disease with heart failure: Secondary | ICD-10-CM | POA: Diagnosis not present

## 2023-10-11 DIAGNOSIS — I951 Orthostatic hypotension: Secondary | ICD-10-CM | POA: Diagnosis not present

## 2023-10-11 DIAGNOSIS — G20A1 Parkinson's disease without dyskinesia, without mention of fluctuations: Secondary | ICD-10-CM | POA: Diagnosis not present

## 2023-10-11 DIAGNOSIS — E119 Type 2 diabetes mellitus without complications: Secondary | ICD-10-CM | POA: Diagnosis not present

## 2023-10-11 DIAGNOSIS — G8194 Hemiplegia, unspecified affecting left nondominant side: Secondary | ICD-10-CM | POA: Diagnosis not present

## 2023-10-11 DIAGNOSIS — R630 Anorexia: Secondary | ICD-10-CM | POA: Diagnosis not present

## 2023-10-12 DIAGNOSIS — G8194 Hemiplegia, unspecified affecting left nondominant side: Secondary | ICD-10-CM | POA: Diagnosis not present

## 2023-10-12 DIAGNOSIS — R630 Anorexia: Secondary | ICD-10-CM | POA: Diagnosis not present

## 2023-10-12 DIAGNOSIS — G20A1 Parkinson's disease without dyskinesia, without mention of fluctuations: Secondary | ICD-10-CM | POA: Diagnosis not present

## 2023-10-12 DIAGNOSIS — E119 Type 2 diabetes mellitus without complications: Secondary | ICD-10-CM | POA: Diagnosis not present

## 2023-10-12 DIAGNOSIS — I951 Orthostatic hypotension: Secondary | ICD-10-CM | POA: Diagnosis not present

## 2023-10-12 DIAGNOSIS — I110 Hypertensive heart disease with heart failure: Secondary | ICD-10-CM | POA: Diagnosis not present

## 2023-10-13 DIAGNOSIS — G8194 Hemiplegia, unspecified affecting left nondominant side: Secondary | ICD-10-CM | POA: Diagnosis not present

## 2023-10-13 DIAGNOSIS — I110 Hypertensive heart disease with heart failure: Secondary | ICD-10-CM | POA: Diagnosis not present

## 2023-10-13 DIAGNOSIS — E119 Type 2 diabetes mellitus without complications: Secondary | ICD-10-CM | POA: Diagnosis not present

## 2023-10-13 DIAGNOSIS — R630 Anorexia: Secondary | ICD-10-CM | POA: Diagnosis not present

## 2023-10-13 DIAGNOSIS — I951 Orthostatic hypotension: Secondary | ICD-10-CM | POA: Diagnosis not present

## 2023-10-13 DIAGNOSIS — G20A1 Parkinson's disease without dyskinesia, without mention of fluctuations: Secondary | ICD-10-CM | POA: Diagnosis not present

## 2023-10-16 DIAGNOSIS — R630 Anorexia: Secondary | ICD-10-CM | POA: Diagnosis not present

## 2023-10-16 DIAGNOSIS — E119 Type 2 diabetes mellitus without complications: Secondary | ICD-10-CM | POA: Diagnosis not present

## 2023-10-16 DIAGNOSIS — I951 Orthostatic hypotension: Secondary | ICD-10-CM | POA: Diagnosis not present

## 2023-10-16 DIAGNOSIS — G20A1 Parkinson's disease without dyskinesia, without mention of fluctuations: Secondary | ICD-10-CM | POA: Diagnosis not present

## 2023-10-16 DIAGNOSIS — I110 Hypertensive heart disease with heart failure: Secondary | ICD-10-CM | POA: Diagnosis not present

## 2023-10-16 DIAGNOSIS — G8194 Hemiplegia, unspecified affecting left nondominant side: Secondary | ICD-10-CM | POA: Diagnosis not present

## 2023-10-18 DIAGNOSIS — I951 Orthostatic hypotension: Secondary | ICD-10-CM | POA: Diagnosis not present

## 2023-10-18 DIAGNOSIS — I110 Hypertensive heart disease with heart failure: Secondary | ICD-10-CM | POA: Diagnosis not present

## 2023-10-18 DIAGNOSIS — R630 Anorexia: Secondary | ICD-10-CM | POA: Diagnosis not present

## 2023-10-18 DIAGNOSIS — E119 Type 2 diabetes mellitus without complications: Secondary | ICD-10-CM | POA: Diagnosis not present

## 2023-10-18 DIAGNOSIS — G20A1 Parkinson's disease without dyskinesia, without mention of fluctuations: Secondary | ICD-10-CM | POA: Diagnosis not present

## 2023-10-18 DIAGNOSIS — G8194 Hemiplegia, unspecified affecting left nondominant side: Secondary | ICD-10-CM | POA: Diagnosis not present

## 2023-10-20 DIAGNOSIS — G8194 Hemiplegia, unspecified affecting left nondominant side: Secondary | ICD-10-CM | POA: Diagnosis not present

## 2023-10-20 DIAGNOSIS — I110 Hypertensive heart disease with heart failure: Secondary | ICD-10-CM | POA: Diagnosis not present

## 2023-10-20 DIAGNOSIS — E119 Type 2 diabetes mellitus without complications: Secondary | ICD-10-CM | POA: Diagnosis not present

## 2023-10-20 DIAGNOSIS — G20A1 Parkinson's disease without dyskinesia, without mention of fluctuations: Secondary | ICD-10-CM | POA: Diagnosis not present

## 2023-10-20 DIAGNOSIS — I951 Orthostatic hypotension: Secondary | ICD-10-CM | POA: Diagnosis not present

## 2023-10-20 DIAGNOSIS — R630 Anorexia: Secondary | ICD-10-CM | POA: Diagnosis not present

## 2023-10-23 DIAGNOSIS — E119 Type 2 diabetes mellitus without complications: Secondary | ICD-10-CM | POA: Diagnosis not present

## 2023-10-23 DIAGNOSIS — R630 Anorexia: Secondary | ICD-10-CM | POA: Diagnosis not present

## 2023-10-23 DIAGNOSIS — G20A1 Parkinson's disease without dyskinesia, without mention of fluctuations: Secondary | ICD-10-CM | POA: Diagnosis not present

## 2023-10-23 DIAGNOSIS — I110 Hypertensive heart disease with heart failure: Secondary | ICD-10-CM | POA: Diagnosis not present

## 2023-10-23 DIAGNOSIS — I951 Orthostatic hypotension: Secondary | ICD-10-CM | POA: Diagnosis not present

## 2023-10-23 DIAGNOSIS — G8194 Hemiplegia, unspecified affecting left nondominant side: Secondary | ICD-10-CM | POA: Diagnosis not present

## 2023-10-26 ENCOUNTER — Ambulatory Visit (INDEPENDENT_AMBULATORY_CARE_PROVIDER_SITE_OTHER): Payer: Medicare Other

## 2023-10-26 DIAGNOSIS — I428 Other cardiomyopathies: Secondary | ICD-10-CM

## 2023-10-26 LAB — CUP PACEART REMOTE DEVICE CHECK
Battery Remaining Longevity: 3 mo
Battery Voltage: 2.82 V
Brady Statistic AP VP Percent: 0 %
Brady Statistic AP VS Percent: 0 %
Brady Statistic AS VP Percent: 0.03 %
Brady Statistic AS VS Percent: 99.97 %
Brady Statistic RA Percent Paced: 0.01 %
Brady Statistic RV Percent Paced: 0.03 %
Date Time Interrogation Session: 20250327112607
HighPow Impedance: 61 Ohm
Implantable Lead Connection Status: 753985
Implantable Lead Connection Status: 753985
Implantable Lead Implant Date: 20150427
Implantable Lead Implant Date: 20150427
Implantable Lead Location: 753859
Implantable Lead Location: 753860
Implantable Lead Model: 5076
Implantable Lead Model: 6935
Implantable Pulse Generator Implant Date: 20150427
Lead Channel Impedance Value: 361 Ohm
Lead Channel Impedance Value: 361 Ohm
Lead Channel Impedance Value: 4047 Ohm
Lead Channel Impedance Value: 4047 Ohm
Lead Channel Impedance Value: 4047 Ohm
Lead Channel Impedance Value: 418 Ohm
Lead Channel Pacing Threshold Amplitude: 0.625 V
Lead Channel Pacing Threshold Amplitude: 0.75 V
Lead Channel Pacing Threshold Pulse Width: 0.4 ms
Lead Channel Pacing Threshold Pulse Width: 0.4 ms
Lead Channel Sensing Intrinsic Amplitude: 1.25 mV
Lead Channel Sensing Intrinsic Amplitude: 1.25 mV
Lead Channel Sensing Intrinsic Amplitude: 21.25 mV
Lead Channel Sensing Intrinsic Amplitude: 21.25 mV
Lead Channel Setting Pacing Amplitude: 2 V
Lead Channel Setting Pacing Amplitude: 2.5 V
Lead Channel Setting Pacing Pulse Width: 0.4 ms
Lead Channel Setting Sensing Sensitivity: 0.3 mV
Zone Setting Status: 755011
Zone Setting Status: 755011
Zone Setting Status: 755011

## 2023-10-27 DIAGNOSIS — G20A1 Parkinson's disease without dyskinesia, without mention of fluctuations: Secondary | ICD-10-CM | POA: Diagnosis not present

## 2023-10-27 DIAGNOSIS — I110 Hypertensive heart disease with heart failure: Secondary | ICD-10-CM | POA: Diagnosis not present

## 2023-10-27 DIAGNOSIS — E119 Type 2 diabetes mellitus without complications: Secondary | ICD-10-CM | POA: Diagnosis not present

## 2023-10-27 DIAGNOSIS — I951 Orthostatic hypotension: Secondary | ICD-10-CM | POA: Diagnosis not present

## 2023-10-27 DIAGNOSIS — R630 Anorexia: Secondary | ICD-10-CM | POA: Diagnosis not present

## 2023-10-27 DIAGNOSIS — G8194 Hemiplegia, unspecified affecting left nondominant side: Secondary | ICD-10-CM | POA: Diagnosis not present

## 2023-10-30 DIAGNOSIS — I110 Hypertensive heart disease with heart failure: Secondary | ICD-10-CM | POA: Diagnosis not present

## 2023-10-30 DIAGNOSIS — G8194 Hemiplegia, unspecified affecting left nondominant side: Secondary | ICD-10-CM | POA: Diagnosis not present

## 2023-10-30 DIAGNOSIS — R630 Anorexia: Secondary | ICD-10-CM | POA: Diagnosis not present

## 2023-10-30 DIAGNOSIS — I951 Orthostatic hypotension: Secondary | ICD-10-CM | POA: Diagnosis not present

## 2023-10-30 DIAGNOSIS — G20A1 Parkinson's disease without dyskinesia, without mention of fluctuations: Secondary | ICD-10-CM | POA: Diagnosis not present

## 2023-10-30 DIAGNOSIS — E119 Type 2 diabetes mellitus without complications: Secondary | ICD-10-CM | POA: Diagnosis not present

## 2023-10-31 DIAGNOSIS — I509 Heart failure, unspecified: Secondary | ICD-10-CM | POA: Diagnosis not present

## 2023-10-31 DIAGNOSIS — K219 Gastro-esophageal reflux disease without esophagitis: Secondary | ICD-10-CM | POA: Diagnosis not present

## 2023-10-31 DIAGNOSIS — E785 Hyperlipidemia, unspecified: Secondary | ICD-10-CM | POA: Diagnosis not present

## 2023-10-31 DIAGNOSIS — E119 Type 2 diabetes mellitus without complications: Secondary | ICD-10-CM | POA: Diagnosis not present

## 2023-10-31 DIAGNOSIS — G20A1 Parkinson's disease without dyskinesia, without mention of fluctuations: Secondary | ICD-10-CM | POA: Diagnosis not present

## 2023-10-31 DIAGNOSIS — I951 Orthostatic hypotension: Secondary | ICD-10-CM | POA: Diagnosis not present

## 2023-10-31 DIAGNOSIS — R000 Tachycardia, unspecified: Secondary | ICD-10-CM | POA: Diagnosis not present

## 2023-10-31 DIAGNOSIS — I428 Other cardiomyopathies: Secondary | ICD-10-CM | POA: Diagnosis not present

## 2023-10-31 DIAGNOSIS — I110 Hypertensive heart disease with heart failure: Secondary | ICD-10-CM | POA: Diagnosis not present

## 2023-10-31 DIAGNOSIS — G8194 Hemiplegia, unspecified affecting left nondominant side: Secondary | ICD-10-CM | POA: Diagnosis not present

## 2023-10-31 DIAGNOSIS — R630 Anorexia: Secondary | ICD-10-CM | POA: Diagnosis not present

## 2023-10-31 DIAGNOSIS — M479 Spondylosis, unspecified: Secondary | ICD-10-CM | POA: Diagnosis not present

## 2023-11-01 DIAGNOSIS — R630 Anorexia: Secondary | ICD-10-CM | POA: Diagnosis not present

## 2023-11-01 DIAGNOSIS — E119 Type 2 diabetes mellitus without complications: Secondary | ICD-10-CM | POA: Diagnosis not present

## 2023-11-01 DIAGNOSIS — G8194 Hemiplegia, unspecified affecting left nondominant side: Secondary | ICD-10-CM | POA: Diagnosis not present

## 2023-11-01 DIAGNOSIS — I110 Hypertensive heart disease with heart failure: Secondary | ICD-10-CM | POA: Diagnosis not present

## 2023-11-01 DIAGNOSIS — G20A1 Parkinson's disease without dyskinesia, without mention of fluctuations: Secondary | ICD-10-CM | POA: Diagnosis not present

## 2023-11-01 DIAGNOSIS — I951 Orthostatic hypotension: Secondary | ICD-10-CM | POA: Diagnosis not present

## 2023-11-01 NOTE — Progress Notes (Signed)
 Remote ICD transmission.

## 2023-11-02 DIAGNOSIS — I951 Orthostatic hypotension: Secondary | ICD-10-CM | POA: Diagnosis not present

## 2023-11-02 DIAGNOSIS — G20A1 Parkinson's disease without dyskinesia, without mention of fluctuations: Secondary | ICD-10-CM | POA: Diagnosis not present

## 2023-11-02 DIAGNOSIS — R630 Anorexia: Secondary | ICD-10-CM | POA: Diagnosis not present

## 2023-11-02 DIAGNOSIS — I110 Hypertensive heart disease with heart failure: Secondary | ICD-10-CM | POA: Diagnosis not present

## 2023-11-02 DIAGNOSIS — G8194 Hemiplegia, unspecified affecting left nondominant side: Secondary | ICD-10-CM | POA: Diagnosis not present

## 2023-11-02 DIAGNOSIS — E119 Type 2 diabetes mellitus without complications: Secondary | ICD-10-CM | POA: Diagnosis not present

## 2023-11-03 DIAGNOSIS — I110 Hypertensive heart disease with heart failure: Secondary | ICD-10-CM | POA: Diagnosis not present

## 2023-11-03 DIAGNOSIS — E119 Type 2 diabetes mellitus without complications: Secondary | ICD-10-CM | POA: Diagnosis not present

## 2023-11-03 DIAGNOSIS — I951 Orthostatic hypotension: Secondary | ICD-10-CM | POA: Diagnosis not present

## 2023-11-03 DIAGNOSIS — G8194 Hemiplegia, unspecified affecting left nondominant side: Secondary | ICD-10-CM | POA: Diagnosis not present

## 2023-11-03 DIAGNOSIS — R630 Anorexia: Secondary | ICD-10-CM | POA: Diagnosis not present

## 2023-11-03 DIAGNOSIS — G20A1 Parkinson's disease without dyskinesia, without mention of fluctuations: Secondary | ICD-10-CM | POA: Diagnosis not present

## 2023-11-05 ENCOUNTER — Encounter: Payer: Self-pay | Admitting: Internal Medicine

## 2023-11-07 DIAGNOSIS — I951 Orthostatic hypotension: Secondary | ICD-10-CM | POA: Diagnosis not present

## 2023-11-07 DIAGNOSIS — E119 Type 2 diabetes mellitus without complications: Secondary | ICD-10-CM | POA: Diagnosis not present

## 2023-11-07 DIAGNOSIS — G8194 Hemiplegia, unspecified affecting left nondominant side: Secondary | ICD-10-CM | POA: Diagnosis not present

## 2023-11-07 DIAGNOSIS — G20A1 Parkinson's disease without dyskinesia, without mention of fluctuations: Secondary | ICD-10-CM | POA: Diagnosis not present

## 2023-11-07 DIAGNOSIS — R630 Anorexia: Secondary | ICD-10-CM | POA: Diagnosis not present

## 2023-11-07 DIAGNOSIS — I110 Hypertensive heart disease with heart failure: Secondary | ICD-10-CM | POA: Diagnosis not present

## 2023-11-09 DIAGNOSIS — G8194 Hemiplegia, unspecified affecting left nondominant side: Secondary | ICD-10-CM | POA: Diagnosis not present

## 2023-11-09 DIAGNOSIS — I951 Orthostatic hypotension: Secondary | ICD-10-CM | POA: Diagnosis not present

## 2023-11-09 DIAGNOSIS — E119 Type 2 diabetes mellitus without complications: Secondary | ICD-10-CM | POA: Diagnosis not present

## 2023-11-09 DIAGNOSIS — R630 Anorexia: Secondary | ICD-10-CM | POA: Diagnosis not present

## 2023-11-09 DIAGNOSIS — I110 Hypertensive heart disease with heart failure: Secondary | ICD-10-CM | POA: Diagnosis not present

## 2023-11-09 DIAGNOSIS — G20A1 Parkinson's disease without dyskinesia, without mention of fluctuations: Secondary | ICD-10-CM | POA: Diagnosis not present

## 2023-11-10 DIAGNOSIS — G8194 Hemiplegia, unspecified affecting left nondominant side: Secondary | ICD-10-CM | POA: Diagnosis not present

## 2023-11-10 DIAGNOSIS — G20A1 Parkinson's disease without dyskinesia, without mention of fluctuations: Secondary | ICD-10-CM | POA: Diagnosis not present

## 2023-11-10 DIAGNOSIS — I110 Hypertensive heart disease with heart failure: Secondary | ICD-10-CM | POA: Diagnosis not present

## 2023-11-10 DIAGNOSIS — I951 Orthostatic hypotension: Secondary | ICD-10-CM | POA: Diagnosis not present

## 2023-11-10 DIAGNOSIS — R630 Anorexia: Secondary | ICD-10-CM | POA: Diagnosis not present

## 2023-11-10 DIAGNOSIS — E119 Type 2 diabetes mellitus without complications: Secondary | ICD-10-CM | POA: Diagnosis not present

## 2023-11-15 DIAGNOSIS — R630 Anorexia: Secondary | ICD-10-CM | POA: Diagnosis not present

## 2023-11-15 DIAGNOSIS — I110 Hypertensive heart disease with heart failure: Secondary | ICD-10-CM | POA: Diagnosis not present

## 2023-11-15 DIAGNOSIS — I951 Orthostatic hypotension: Secondary | ICD-10-CM | POA: Diagnosis not present

## 2023-11-15 DIAGNOSIS — G20A1 Parkinson's disease without dyskinesia, without mention of fluctuations: Secondary | ICD-10-CM | POA: Diagnosis not present

## 2023-11-15 DIAGNOSIS — G8194 Hemiplegia, unspecified affecting left nondominant side: Secondary | ICD-10-CM | POA: Diagnosis not present

## 2023-11-15 DIAGNOSIS — E119 Type 2 diabetes mellitus without complications: Secondary | ICD-10-CM | POA: Diagnosis not present

## 2023-11-16 DIAGNOSIS — R630 Anorexia: Secondary | ICD-10-CM | POA: Diagnosis not present

## 2023-11-16 DIAGNOSIS — I110 Hypertensive heart disease with heart failure: Secondary | ICD-10-CM | POA: Diagnosis not present

## 2023-11-16 DIAGNOSIS — E119 Type 2 diabetes mellitus without complications: Secondary | ICD-10-CM | POA: Diagnosis not present

## 2023-11-16 DIAGNOSIS — G8194 Hemiplegia, unspecified affecting left nondominant side: Secondary | ICD-10-CM | POA: Diagnosis not present

## 2023-11-16 DIAGNOSIS — G20A1 Parkinson's disease without dyskinesia, without mention of fluctuations: Secondary | ICD-10-CM | POA: Diagnosis not present

## 2023-11-16 DIAGNOSIS — I951 Orthostatic hypotension: Secondary | ICD-10-CM | POA: Diagnosis not present

## 2023-11-20 DIAGNOSIS — E119 Type 2 diabetes mellitus without complications: Secondary | ICD-10-CM | POA: Diagnosis not present

## 2023-11-20 DIAGNOSIS — R630 Anorexia: Secondary | ICD-10-CM | POA: Diagnosis not present

## 2023-11-20 DIAGNOSIS — G8194 Hemiplegia, unspecified affecting left nondominant side: Secondary | ICD-10-CM | POA: Diagnosis not present

## 2023-11-20 DIAGNOSIS — G20A1 Parkinson's disease without dyskinesia, without mention of fluctuations: Secondary | ICD-10-CM | POA: Diagnosis not present

## 2023-11-20 DIAGNOSIS — I110 Hypertensive heart disease with heart failure: Secondary | ICD-10-CM | POA: Diagnosis not present

## 2023-11-20 DIAGNOSIS — I951 Orthostatic hypotension: Secondary | ICD-10-CM | POA: Diagnosis not present

## 2023-11-22 DIAGNOSIS — E119 Type 2 diabetes mellitus without complications: Secondary | ICD-10-CM | POA: Diagnosis not present

## 2023-11-22 DIAGNOSIS — I110 Hypertensive heart disease with heart failure: Secondary | ICD-10-CM | POA: Diagnosis not present

## 2023-11-22 DIAGNOSIS — G20A1 Parkinson's disease without dyskinesia, without mention of fluctuations: Secondary | ICD-10-CM | POA: Diagnosis not present

## 2023-11-22 DIAGNOSIS — R630 Anorexia: Secondary | ICD-10-CM | POA: Diagnosis not present

## 2023-11-22 DIAGNOSIS — I951 Orthostatic hypotension: Secondary | ICD-10-CM | POA: Diagnosis not present

## 2023-11-22 DIAGNOSIS — G8194 Hemiplegia, unspecified affecting left nondominant side: Secondary | ICD-10-CM | POA: Diagnosis not present

## 2023-11-23 DIAGNOSIS — G8194 Hemiplegia, unspecified affecting left nondominant side: Secondary | ICD-10-CM | POA: Diagnosis not present

## 2023-11-23 DIAGNOSIS — E119 Type 2 diabetes mellitus without complications: Secondary | ICD-10-CM | POA: Diagnosis not present

## 2023-11-23 DIAGNOSIS — G20A1 Parkinson's disease without dyskinesia, without mention of fluctuations: Secondary | ICD-10-CM | POA: Diagnosis not present

## 2023-11-23 DIAGNOSIS — I951 Orthostatic hypotension: Secondary | ICD-10-CM | POA: Diagnosis not present

## 2023-11-23 DIAGNOSIS — R630 Anorexia: Secondary | ICD-10-CM | POA: Diagnosis not present

## 2023-11-23 DIAGNOSIS — I110 Hypertensive heart disease with heart failure: Secondary | ICD-10-CM | POA: Diagnosis not present

## 2023-11-24 DIAGNOSIS — G20A1 Parkinson's disease without dyskinesia, without mention of fluctuations: Secondary | ICD-10-CM | POA: Diagnosis not present

## 2023-11-24 DIAGNOSIS — I951 Orthostatic hypotension: Secondary | ICD-10-CM | POA: Diagnosis not present

## 2023-11-24 DIAGNOSIS — E119 Type 2 diabetes mellitus without complications: Secondary | ICD-10-CM | POA: Diagnosis not present

## 2023-11-24 DIAGNOSIS — I110 Hypertensive heart disease with heart failure: Secondary | ICD-10-CM | POA: Diagnosis not present

## 2023-11-24 DIAGNOSIS — R630 Anorexia: Secondary | ICD-10-CM | POA: Diagnosis not present

## 2023-11-24 DIAGNOSIS — G8194 Hemiplegia, unspecified affecting left nondominant side: Secondary | ICD-10-CM | POA: Diagnosis not present

## 2023-11-27 ENCOUNTER — Ambulatory Visit (INDEPENDENT_AMBULATORY_CARE_PROVIDER_SITE_OTHER): Payer: Medicare Other

## 2023-11-27 DIAGNOSIS — I428 Other cardiomyopathies: Secondary | ICD-10-CM

## 2023-11-27 DIAGNOSIS — G20A1 Parkinson's disease without dyskinesia, without mention of fluctuations: Secondary | ICD-10-CM | POA: Diagnosis not present

## 2023-11-27 DIAGNOSIS — I951 Orthostatic hypotension: Secondary | ICD-10-CM | POA: Diagnosis not present

## 2023-11-27 DIAGNOSIS — E119 Type 2 diabetes mellitus without complications: Secondary | ICD-10-CM | POA: Diagnosis not present

## 2023-11-27 DIAGNOSIS — I110 Hypertensive heart disease with heart failure: Secondary | ICD-10-CM | POA: Diagnosis not present

## 2023-11-27 DIAGNOSIS — I5022 Chronic systolic (congestive) heart failure: Secondary | ICD-10-CM

## 2023-11-27 DIAGNOSIS — G8194 Hemiplegia, unspecified affecting left nondominant side: Secondary | ICD-10-CM | POA: Diagnosis not present

## 2023-11-27 DIAGNOSIS — R630 Anorexia: Secondary | ICD-10-CM | POA: Diagnosis not present

## 2023-11-27 LAB — CUP PACEART REMOTE DEVICE CHECK
Battery Remaining Longevity: 2 mo
Battery Voltage: 2.8 V
Brady Statistic AP VP Percent: 0 %
Brady Statistic AP VS Percent: 0 %
Brady Statistic AS VP Percent: 0.03 %
Brady Statistic AS VS Percent: 99.97 %
Brady Statistic RA Percent Paced: 0.01 %
Brady Statistic RV Percent Paced: 0.03 %
Date Time Interrogation Session: 20250428061606
HighPow Impedance: 63 Ohm
Implantable Lead Connection Status: 753985
Implantable Lead Connection Status: 753985
Implantable Lead Implant Date: 20150427
Implantable Lead Implant Date: 20150427
Implantable Lead Location: 753859
Implantable Lead Location: 753860
Implantable Lead Model: 5076
Implantable Lead Model: 6935
Implantable Pulse Generator Implant Date: 20150427
Lead Channel Impedance Value: 361 Ohm
Lead Channel Impedance Value: 361 Ohm
Lead Channel Impedance Value: 4047 Ohm
Lead Channel Impedance Value: 4047 Ohm
Lead Channel Impedance Value: 4047 Ohm
Lead Channel Impedance Value: 418 Ohm
Lead Channel Pacing Threshold Amplitude: 0.5 V
Lead Channel Pacing Threshold Amplitude: 0.75 V
Lead Channel Pacing Threshold Pulse Width: 0.4 ms
Lead Channel Pacing Threshold Pulse Width: 0.4 ms
Lead Channel Sensing Intrinsic Amplitude: 1.375 mV
Lead Channel Sensing Intrinsic Amplitude: 1.375 mV
Lead Channel Sensing Intrinsic Amplitude: 21.25 mV
Lead Channel Sensing Intrinsic Amplitude: 21.25 mV
Lead Channel Setting Pacing Amplitude: 2 V
Lead Channel Setting Pacing Amplitude: 2.5 V
Lead Channel Setting Pacing Pulse Width: 0.4 ms
Lead Channel Setting Sensing Sensitivity: 0.3 mV
Zone Setting Status: 755011
Zone Setting Status: 755011
Zone Setting Status: 755011

## 2023-11-28 ENCOUNTER — Encounter: Payer: Self-pay | Admitting: Internal Medicine

## 2023-11-29 DIAGNOSIS — E119 Type 2 diabetes mellitus without complications: Secondary | ICD-10-CM | POA: Diagnosis not present

## 2023-11-29 DIAGNOSIS — G8194 Hemiplegia, unspecified affecting left nondominant side: Secondary | ICD-10-CM | POA: Diagnosis not present

## 2023-11-29 DIAGNOSIS — I110 Hypertensive heart disease with heart failure: Secondary | ICD-10-CM | POA: Diagnosis not present

## 2023-11-29 DIAGNOSIS — R630 Anorexia: Secondary | ICD-10-CM | POA: Diagnosis not present

## 2023-11-29 DIAGNOSIS — G20A1 Parkinson's disease without dyskinesia, without mention of fluctuations: Secondary | ICD-10-CM | POA: Diagnosis not present

## 2023-11-29 DIAGNOSIS — I951 Orthostatic hypotension: Secondary | ICD-10-CM | POA: Diagnosis not present

## 2023-11-30 DIAGNOSIS — K219 Gastro-esophageal reflux disease without esophagitis: Secondary | ICD-10-CM | POA: Diagnosis not present

## 2023-11-30 DIAGNOSIS — E119 Type 2 diabetes mellitus without complications: Secondary | ICD-10-CM | POA: Diagnosis not present

## 2023-11-30 DIAGNOSIS — E785 Hyperlipidemia, unspecified: Secondary | ICD-10-CM | POA: Diagnosis not present

## 2023-11-30 DIAGNOSIS — R000 Tachycardia, unspecified: Secondary | ICD-10-CM | POA: Diagnosis not present

## 2023-11-30 DIAGNOSIS — I951 Orthostatic hypotension: Secondary | ICD-10-CM | POA: Diagnosis not present

## 2023-11-30 DIAGNOSIS — R0602 Shortness of breath: Secondary | ICD-10-CM | POA: Diagnosis not present

## 2023-11-30 DIAGNOSIS — I428 Other cardiomyopathies: Secondary | ICD-10-CM | POA: Diagnosis not present

## 2023-11-30 DIAGNOSIS — R630 Anorexia: Secondary | ICD-10-CM | POA: Diagnosis not present

## 2023-11-30 DIAGNOSIS — I509 Heart failure, unspecified: Secondary | ICD-10-CM | POA: Diagnosis not present

## 2023-11-30 DIAGNOSIS — G20A1 Parkinson's disease without dyskinesia, without mention of fluctuations: Secondary | ICD-10-CM | POA: Diagnosis not present

## 2023-11-30 DIAGNOSIS — M479 Spondylosis, unspecified: Secondary | ICD-10-CM | POA: Diagnosis not present

## 2023-11-30 DIAGNOSIS — G8194 Hemiplegia, unspecified affecting left nondominant side: Secondary | ICD-10-CM | POA: Diagnosis not present

## 2023-11-30 DIAGNOSIS — I110 Hypertensive heart disease with heart failure: Secondary | ICD-10-CM | POA: Diagnosis not present

## 2023-12-01 DIAGNOSIS — E119 Type 2 diabetes mellitus without complications: Secondary | ICD-10-CM | POA: Diagnosis not present

## 2023-12-01 DIAGNOSIS — G20A1 Parkinson's disease without dyskinesia, without mention of fluctuations: Secondary | ICD-10-CM | POA: Diagnosis not present

## 2023-12-01 DIAGNOSIS — G8194 Hemiplegia, unspecified affecting left nondominant side: Secondary | ICD-10-CM | POA: Diagnosis not present

## 2023-12-01 DIAGNOSIS — I110 Hypertensive heart disease with heart failure: Secondary | ICD-10-CM | POA: Diagnosis not present

## 2023-12-01 DIAGNOSIS — I951 Orthostatic hypotension: Secondary | ICD-10-CM | POA: Diagnosis not present

## 2023-12-01 DIAGNOSIS — R630 Anorexia: Secondary | ICD-10-CM | POA: Diagnosis not present

## 2023-12-04 DIAGNOSIS — R630 Anorexia: Secondary | ICD-10-CM | POA: Diagnosis not present

## 2023-12-04 DIAGNOSIS — E119 Type 2 diabetes mellitus without complications: Secondary | ICD-10-CM | POA: Diagnosis not present

## 2023-12-04 DIAGNOSIS — G8194 Hemiplegia, unspecified affecting left nondominant side: Secondary | ICD-10-CM | POA: Diagnosis not present

## 2023-12-04 DIAGNOSIS — I951 Orthostatic hypotension: Secondary | ICD-10-CM | POA: Diagnosis not present

## 2023-12-04 DIAGNOSIS — I110 Hypertensive heart disease with heart failure: Secondary | ICD-10-CM | POA: Diagnosis not present

## 2023-12-04 DIAGNOSIS — G20A1 Parkinson's disease without dyskinesia, without mention of fluctuations: Secondary | ICD-10-CM | POA: Diagnosis not present

## 2023-12-06 DIAGNOSIS — G8194 Hemiplegia, unspecified affecting left nondominant side: Secondary | ICD-10-CM | POA: Diagnosis not present

## 2023-12-06 DIAGNOSIS — E119 Type 2 diabetes mellitus without complications: Secondary | ICD-10-CM | POA: Diagnosis not present

## 2023-12-06 DIAGNOSIS — I110 Hypertensive heart disease with heart failure: Secondary | ICD-10-CM | POA: Diagnosis not present

## 2023-12-06 DIAGNOSIS — I951 Orthostatic hypotension: Secondary | ICD-10-CM | POA: Diagnosis not present

## 2023-12-06 DIAGNOSIS — R630 Anorexia: Secondary | ICD-10-CM | POA: Diagnosis not present

## 2023-12-06 DIAGNOSIS — G20A1 Parkinson's disease without dyskinesia, without mention of fluctuations: Secondary | ICD-10-CM | POA: Diagnosis not present

## 2023-12-07 DIAGNOSIS — R630 Anorexia: Secondary | ICD-10-CM | POA: Diagnosis not present

## 2023-12-07 DIAGNOSIS — I951 Orthostatic hypotension: Secondary | ICD-10-CM | POA: Diagnosis not present

## 2023-12-07 DIAGNOSIS — G8194 Hemiplegia, unspecified affecting left nondominant side: Secondary | ICD-10-CM | POA: Diagnosis not present

## 2023-12-07 DIAGNOSIS — I110 Hypertensive heart disease with heart failure: Secondary | ICD-10-CM | POA: Diagnosis not present

## 2023-12-07 DIAGNOSIS — G20A1 Parkinson's disease without dyskinesia, without mention of fluctuations: Secondary | ICD-10-CM | POA: Diagnosis not present

## 2023-12-07 DIAGNOSIS — E119 Type 2 diabetes mellitus without complications: Secondary | ICD-10-CM | POA: Diagnosis not present

## 2023-12-07 NOTE — Progress Notes (Signed)
 Remote ICD transmission.

## 2023-12-08 DIAGNOSIS — G8194 Hemiplegia, unspecified affecting left nondominant side: Secondary | ICD-10-CM | POA: Diagnosis not present

## 2023-12-08 DIAGNOSIS — R630 Anorexia: Secondary | ICD-10-CM | POA: Diagnosis not present

## 2023-12-08 DIAGNOSIS — E119 Type 2 diabetes mellitus without complications: Secondary | ICD-10-CM | POA: Diagnosis not present

## 2023-12-08 DIAGNOSIS — I951 Orthostatic hypotension: Secondary | ICD-10-CM | POA: Diagnosis not present

## 2023-12-08 DIAGNOSIS — I110 Hypertensive heart disease with heart failure: Secondary | ICD-10-CM | POA: Diagnosis not present

## 2023-12-08 DIAGNOSIS — G20A1 Parkinson's disease without dyskinesia, without mention of fluctuations: Secondary | ICD-10-CM | POA: Diagnosis not present

## 2023-12-11 DIAGNOSIS — E119 Type 2 diabetes mellitus without complications: Secondary | ICD-10-CM | POA: Diagnosis not present

## 2023-12-11 DIAGNOSIS — I951 Orthostatic hypotension: Secondary | ICD-10-CM | POA: Diagnosis not present

## 2023-12-11 DIAGNOSIS — R630 Anorexia: Secondary | ICD-10-CM | POA: Diagnosis not present

## 2023-12-11 DIAGNOSIS — G8194 Hemiplegia, unspecified affecting left nondominant side: Secondary | ICD-10-CM | POA: Diagnosis not present

## 2023-12-11 DIAGNOSIS — I110 Hypertensive heart disease with heart failure: Secondary | ICD-10-CM | POA: Diagnosis not present

## 2023-12-11 DIAGNOSIS — G20A1 Parkinson's disease without dyskinesia, without mention of fluctuations: Secondary | ICD-10-CM | POA: Diagnosis not present

## 2023-12-13 DIAGNOSIS — E119 Type 2 diabetes mellitus without complications: Secondary | ICD-10-CM | POA: Diagnosis not present

## 2023-12-13 DIAGNOSIS — R630 Anorexia: Secondary | ICD-10-CM | POA: Diagnosis not present

## 2023-12-13 DIAGNOSIS — I110 Hypertensive heart disease with heart failure: Secondary | ICD-10-CM | POA: Diagnosis not present

## 2023-12-13 DIAGNOSIS — G20A1 Parkinson's disease without dyskinesia, without mention of fluctuations: Secondary | ICD-10-CM | POA: Diagnosis not present

## 2023-12-13 DIAGNOSIS — G8194 Hemiplegia, unspecified affecting left nondominant side: Secondary | ICD-10-CM | POA: Diagnosis not present

## 2023-12-13 DIAGNOSIS — I951 Orthostatic hypotension: Secondary | ICD-10-CM | POA: Diagnosis not present

## 2023-12-14 DIAGNOSIS — E119 Type 2 diabetes mellitus without complications: Secondary | ICD-10-CM | POA: Diagnosis not present

## 2023-12-14 DIAGNOSIS — I110 Hypertensive heart disease with heart failure: Secondary | ICD-10-CM | POA: Diagnosis not present

## 2023-12-14 DIAGNOSIS — R630 Anorexia: Secondary | ICD-10-CM | POA: Diagnosis not present

## 2023-12-14 DIAGNOSIS — I951 Orthostatic hypotension: Secondary | ICD-10-CM | POA: Diagnosis not present

## 2023-12-14 DIAGNOSIS — G8194 Hemiplegia, unspecified affecting left nondominant side: Secondary | ICD-10-CM | POA: Diagnosis not present

## 2023-12-14 DIAGNOSIS — G20A1 Parkinson's disease without dyskinesia, without mention of fluctuations: Secondary | ICD-10-CM | POA: Diagnosis not present

## 2023-12-15 DIAGNOSIS — G20A1 Parkinson's disease without dyskinesia, without mention of fluctuations: Secondary | ICD-10-CM | POA: Diagnosis not present

## 2023-12-15 DIAGNOSIS — G8194 Hemiplegia, unspecified affecting left nondominant side: Secondary | ICD-10-CM | POA: Diagnosis not present

## 2023-12-15 DIAGNOSIS — I951 Orthostatic hypotension: Secondary | ICD-10-CM | POA: Diagnosis not present

## 2023-12-15 DIAGNOSIS — I110 Hypertensive heart disease with heart failure: Secondary | ICD-10-CM | POA: Diagnosis not present

## 2023-12-15 DIAGNOSIS — E119 Type 2 diabetes mellitus without complications: Secondary | ICD-10-CM | POA: Diagnosis not present

## 2023-12-15 DIAGNOSIS — R630 Anorexia: Secondary | ICD-10-CM | POA: Diagnosis not present

## 2023-12-18 DIAGNOSIS — G20A1 Parkinson's disease without dyskinesia, without mention of fluctuations: Secondary | ICD-10-CM | POA: Diagnosis not present

## 2023-12-18 DIAGNOSIS — I110 Hypertensive heart disease with heart failure: Secondary | ICD-10-CM | POA: Diagnosis not present

## 2023-12-18 DIAGNOSIS — R630 Anorexia: Secondary | ICD-10-CM | POA: Diagnosis not present

## 2023-12-18 DIAGNOSIS — E119 Type 2 diabetes mellitus without complications: Secondary | ICD-10-CM | POA: Diagnosis not present

## 2023-12-18 DIAGNOSIS — I951 Orthostatic hypotension: Secondary | ICD-10-CM | POA: Diagnosis not present

## 2023-12-18 DIAGNOSIS — G8194 Hemiplegia, unspecified affecting left nondominant side: Secondary | ICD-10-CM | POA: Diagnosis not present

## 2023-12-20 DIAGNOSIS — I951 Orthostatic hypotension: Secondary | ICD-10-CM | POA: Diagnosis not present

## 2023-12-20 DIAGNOSIS — G8194 Hemiplegia, unspecified affecting left nondominant side: Secondary | ICD-10-CM | POA: Diagnosis not present

## 2023-12-20 DIAGNOSIS — I110 Hypertensive heart disease with heart failure: Secondary | ICD-10-CM | POA: Diagnosis not present

## 2023-12-20 DIAGNOSIS — R630 Anorexia: Secondary | ICD-10-CM | POA: Diagnosis not present

## 2023-12-20 DIAGNOSIS — E119 Type 2 diabetes mellitus without complications: Secondary | ICD-10-CM | POA: Diagnosis not present

## 2023-12-20 DIAGNOSIS — G20A1 Parkinson's disease without dyskinesia, without mention of fluctuations: Secondary | ICD-10-CM | POA: Diagnosis not present

## 2023-12-21 DIAGNOSIS — R630 Anorexia: Secondary | ICD-10-CM | POA: Diagnosis not present

## 2023-12-21 DIAGNOSIS — G20A1 Parkinson's disease without dyskinesia, without mention of fluctuations: Secondary | ICD-10-CM | POA: Diagnosis not present

## 2023-12-21 DIAGNOSIS — I951 Orthostatic hypotension: Secondary | ICD-10-CM | POA: Diagnosis not present

## 2023-12-21 DIAGNOSIS — G8194 Hemiplegia, unspecified affecting left nondominant side: Secondary | ICD-10-CM | POA: Diagnosis not present

## 2023-12-21 DIAGNOSIS — E119 Type 2 diabetes mellitus without complications: Secondary | ICD-10-CM | POA: Diagnosis not present

## 2023-12-21 DIAGNOSIS — I110 Hypertensive heart disease with heart failure: Secondary | ICD-10-CM | POA: Diagnosis not present

## 2023-12-22 DIAGNOSIS — G8194 Hemiplegia, unspecified affecting left nondominant side: Secondary | ICD-10-CM | POA: Diagnosis not present

## 2023-12-22 DIAGNOSIS — I110 Hypertensive heart disease with heart failure: Secondary | ICD-10-CM | POA: Diagnosis not present

## 2023-12-22 DIAGNOSIS — E119 Type 2 diabetes mellitus without complications: Secondary | ICD-10-CM | POA: Diagnosis not present

## 2023-12-22 DIAGNOSIS — I951 Orthostatic hypotension: Secondary | ICD-10-CM | POA: Diagnosis not present

## 2023-12-22 DIAGNOSIS — G20A1 Parkinson's disease without dyskinesia, without mention of fluctuations: Secondary | ICD-10-CM | POA: Diagnosis not present

## 2023-12-22 DIAGNOSIS — R630 Anorexia: Secondary | ICD-10-CM | POA: Diagnosis not present

## 2023-12-23 DIAGNOSIS — I110 Hypertensive heart disease with heart failure: Secondary | ICD-10-CM | POA: Diagnosis not present

## 2023-12-23 DIAGNOSIS — R630 Anorexia: Secondary | ICD-10-CM | POA: Diagnosis not present

## 2023-12-23 DIAGNOSIS — G20A1 Parkinson's disease without dyskinesia, without mention of fluctuations: Secondary | ICD-10-CM | POA: Diagnosis not present

## 2023-12-23 DIAGNOSIS — G8194 Hemiplegia, unspecified affecting left nondominant side: Secondary | ICD-10-CM | POA: Diagnosis not present

## 2023-12-23 DIAGNOSIS — E119 Type 2 diabetes mellitus without complications: Secondary | ICD-10-CM | POA: Diagnosis not present

## 2023-12-23 DIAGNOSIS — I951 Orthostatic hypotension: Secondary | ICD-10-CM | POA: Diagnosis not present

## 2023-12-27 DIAGNOSIS — G8194 Hemiplegia, unspecified affecting left nondominant side: Secondary | ICD-10-CM | POA: Diagnosis not present

## 2023-12-27 DIAGNOSIS — G20A1 Parkinson's disease without dyskinesia, without mention of fluctuations: Secondary | ICD-10-CM | POA: Diagnosis not present

## 2023-12-27 DIAGNOSIS — I110 Hypertensive heart disease with heart failure: Secondary | ICD-10-CM | POA: Diagnosis not present

## 2023-12-27 DIAGNOSIS — E119 Type 2 diabetes mellitus without complications: Secondary | ICD-10-CM | POA: Diagnosis not present

## 2023-12-27 DIAGNOSIS — R630 Anorexia: Secondary | ICD-10-CM | POA: Diagnosis not present

## 2023-12-27 DIAGNOSIS — I951 Orthostatic hypotension: Secondary | ICD-10-CM | POA: Diagnosis not present

## 2023-12-29 DIAGNOSIS — I110 Hypertensive heart disease with heart failure: Secondary | ICD-10-CM | POA: Diagnosis not present

## 2023-12-29 DIAGNOSIS — G8194 Hemiplegia, unspecified affecting left nondominant side: Secondary | ICD-10-CM | POA: Diagnosis not present

## 2023-12-29 DIAGNOSIS — G20A1 Parkinson's disease without dyskinesia, without mention of fluctuations: Secondary | ICD-10-CM | POA: Diagnosis not present

## 2023-12-29 DIAGNOSIS — R630 Anorexia: Secondary | ICD-10-CM | POA: Diagnosis not present

## 2023-12-29 DIAGNOSIS — I951 Orthostatic hypotension: Secondary | ICD-10-CM | POA: Diagnosis not present

## 2023-12-29 DIAGNOSIS — E119 Type 2 diabetes mellitus without complications: Secondary | ICD-10-CM | POA: Diagnosis not present

## 2023-12-31 DIAGNOSIS — G20A1 Parkinson's disease without dyskinesia, without mention of fluctuations: Secondary | ICD-10-CM | POA: Diagnosis not present

## 2023-12-31 DIAGNOSIS — G8194 Hemiplegia, unspecified affecting left nondominant side: Secondary | ICD-10-CM | POA: Diagnosis not present

## 2023-12-31 DIAGNOSIS — R630 Anorexia: Secondary | ICD-10-CM | POA: Diagnosis not present

## 2023-12-31 DIAGNOSIS — R000 Tachycardia, unspecified: Secondary | ICD-10-CM | POA: Diagnosis not present

## 2023-12-31 DIAGNOSIS — E785 Hyperlipidemia, unspecified: Secondary | ICD-10-CM | POA: Diagnosis not present

## 2023-12-31 DIAGNOSIS — I951 Orthostatic hypotension: Secondary | ICD-10-CM | POA: Diagnosis not present

## 2023-12-31 DIAGNOSIS — E119 Type 2 diabetes mellitus without complications: Secondary | ICD-10-CM | POA: Diagnosis not present

## 2023-12-31 DIAGNOSIS — K219 Gastro-esophageal reflux disease without esophagitis: Secondary | ICD-10-CM | POA: Diagnosis not present

## 2023-12-31 DIAGNOSIS — I509 Heart failure, unspecified: Secondary | ICD-10-CM | POA: Diagnosis not present

## 2023-12-31 DIAGNOSIS — I428 Other cardiomyopathies: Secondary | ICD-10-CM | POA: Diagnosis not present

## 2023-12-31 DIAGNOSIS — M479 Spondylosis, unspecified: Secondary | ICD-10-CM | POA: Diagnosis not present

## 2023-12-31 DIAGNOSIS — I110 Hypertensive heart disease with heart failure: Secondary | ICD-10-CM | POA: Diagnosis not present

## 2023-12-31 NOTE — Progress Notes (Deleted)
 {This patient may be at risk for Amyloid. She has one or more dx on the prob list or PMH from the following list -  Abnormal EKG, HFpEF/Diastolic CHF, Aortic Stenosis, LVH, Bilateral Carpal Tunnel Syndrome, Biceps Tendon Rupture, Spinal Stenosis, Pericardial Effusion, Left Atrial Enlargement, Conduction System Disorder. See list below or review PMH.  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) 08/19/2021     QT prolongation 09/21/2022     Syncope and collapse Unknown    Click HERE to open Cardiac Amyloid Screening SmartSet to order screening OR Click HERE to defer testing for 1 year or permanently :1}    OFFICE NOTE Date:  12/31/2023  ID:  Veronica Shaw, DOB [birth year], MRN [id number] PCP: Veronica Kidd, MD  [facility] Providers Cardiologist:  Veronica Lapping, MD Cardiology APP:  Veronica Joe, PA-C  Electrophysiologist:  Veronica Sells, MD { Click to update primary MD,subspecialty MD or APP then REFRESH:1}    Patient Profile:    HFmrEF (heart failure with mildly reduced ejection fraction)  Non-ischemic cardiomyopathy Cath 2014: no sig CAD Myoview  12/29/16: EF 49, no ischemia  CMR 4/15: EF 34 Echocardiogram 5/17: EF 20-25 Echocardiogram 6/18: EF 30-35 Echo 10/18/17: EF 60-65, normal wall motion, grade 2 diastolic dysfunction  Echo 10/18/20: EF 40-45, global HK, normal RVSF, RVSP 22  TTE 09/22/2022: EF 45-50, global HK, normal RVSF, trivial MR, AV sclerosis S/p AICD Diabetes mellitus Endo: Dr. Rosalea Shaw Hyperlipidemia Chronic kidney disease  OSA Asthma Parkinson's Disease Neuro: Dr. Winferd Shaw Syncope 09/2017 Sinus tachycardia Cervical spine DDD        Discussed the use of AI scribe software for clinical note transcription with the patient, who gave verbal consent to proceed. History of Present Illness Veronica Shaw is a 73 y.o. female who returns for follow up of CHF. She was last seen by Dr. Arlester Ladd in 07/2023. She has advanced Parkinson's and has been taken off of all GDMT due to hypotension and frailty. She has been managed by palliative care in the past.     ROS-See HPI***    Studies Reviewed:      *** Results  Risk Assessment/Calculations: {Does this patient have ATRIAL FIBRILLATION?:[phone]} No BP recorded.  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  Volume status currently stable.  She is followed in the [facility] clinic.  Recent thoracic impedance measurement was normal.  Her weights have been stable.  She is having increasing difficulty tolerating heart failure medications due to low blood pressure.  Since last seen, she has been taken off of losartan .  I think it is reasonable to stop her spironolactone .  She has had elevated heart rates in the past.  I would like to try to  keep her on her current dose of metoprolol  if possible. DC spironolactone  Continue Lasix  20 mg daily, metoprolol  succinate 50 mg twice daily Follow-up BMET today Patient knows to contact me if her weight, swelling increases Arrange follow-up ICM clinic visit Follow-up 6 months   Chronic kidney disease, stage 3a (HCC) Obtain f/u BMET today.    Type II diabetes mellitus with complication (HCC) SGLT2 inhibitor is a consideration for CHF management. These were too expensive for her in the past. She is doing well on her current regimen. I do not think she could tolerate the  addition of a SGLT2i.    ICD (implantable cardioverter-defibrillator), dual, in situ F/u with EP as planned.      :1}    {Are you ordering a CV Procedure (e.g. stress test, cath, DCCV, TEE, etc)?   Press F2        :[id number]}  Dispo:  No follow-ups on file. Signed, Veronica Single, PA-C

## 2024-01-01 ENCOUNTER — Ambulatory Visit: Admitting: Physician Assistant

## 2024-01-01 DIAGNOSIS — G8194 Hemiplegia, unspecified affecting left nondominant side: Secondary | ICD-10-CM | POA: Diagnosis not present

## 2024-01-01 DIAGNOSIS — I951 Orthostatic hypotension: Secondary | ICD-10-CM | POA: Diagnosis not present

## 2024-01-01 DIAGNOSIS — G20A1 Parkinson's disease without dyskinesia, without mention of fluctuations: Secondary | ICD-10-CM | POA: Diagnosis not present

## 2024-01-01 DIAGNOSIS — E119 Type 2 diabetes mellitus without complications: Secondary | ICD-10-CM | POA: Diagnosis not present

## 2024-01-01 DIAGNOSIS — I5020 Unspecified systolic (congestive) heart failure: Secondary | ICD-10-CM

## 2024-01-01 DIAGNOSIS — I110 Hypertensive heart disease with heart failure: Secondary | ICD-10-CM | POA: Diagnosis not present

## 2024-01-01 DIAGNOSIS — R630 Anorexia: Secondary | ICD-10-CM | POA: Diagnosis not present

## 2024-01-03 DIAGNOSIS — R630 Anorexia: Secondary | ICD-10-CM | POA: Diagnosis not present

## 2024-01-03 DIAGNOSIS — G20A1 Parkinson's disease without dyskinesia, without mention of fluctuations: Secondary | ICD-10-CM | POA: Diagnosis not present

## 2024-01-03 DIAGNOSIS — E119 Type 2 diabetes mellitus without complications: Secondary | ICD-10-CM | POA: Diagnosis not present

## 2024-01-03 DIAGNOSIS — G8194 Hemiplegia, unspecified affecting left nondominant side: Secondary | ICD-10-CM | POA: Diagnosis not present

## 2024-01-03 DIAGNOSIS — I951 Orthostatic hypotension: Secondary | ICD-10-CM | POA: Diagnosis not present

## 2024-01-03 DIAGNOSIS — I110 Hypertensive heart disease with heart failure: Secondary | ICD-10-CM | POA: Diagnosis not present

## 2024-01-04 DIAGNOSIS — R630 Anorexia: Secondary | ICD-10-CM | POA: Diagnosis not present

## 2024-01-04 DIAGNOSIS — E119 Type 2 diabetes mellitus without complications: Secondary | ICD-10-CM | POA: Diagnosis not present

## 2024-01-04 DIAGNOSIS — I951 Orthostatic hypotension: Secondary | ICD-10-CM | POA: Diagnosis not present

## 2024-01-04 DIAGNOSIS — G8194 Hemiplegia, unspecified affecting left nondominant side: Secondary | ICD-10-CM | POA: Diagnosis not present

## 2024-01-04 DIAGNOSIS — I110 Hypertensive heart disease with heart failure: Secondary | ICD-10-CM | POA: Diagnosis not present

## 2024-01-04 DIAGNOSIS — G20A1 Parkinson's disease without dyskinesia, without mention of fluctuations: Secondary | ICD-10-CM | POA: Diagnosis not present

## 2024-01-05 DIAGNOSIS — I110 Hypertensive heart disease with heart failure: Secondary | ICD-10-CM | POA: Diagnosis not present

## 2024-01-05 DIAGNOSIS — R630 Anorexia: Secondary | ICD-10-CM | POA: Diagnosis not present

## 2024-01-05 DIAGNOSIS — G20A1 Parkinson's disease without dyskinesia, without mention of fluctuations: Secondary | ICD-10-CM | POA: Diagnosis not present

## 2024-01-05 DIAGNOSIS — G8194 Hemiplegia, unspecified affecting left nondominant side: Secondary | ICD-10-CM | POA: Diagnosis not present

## 2024-01-05 DIAGNOSIS — E119 Type 2 diabetes mellitus without complications: Secondary | ICD-10-CM | POA: Diagnosis not present

## 2024-01-05 DIAGNOSIS — I951 Orthostatic hypotension: Secondary | ICD-10-CM | POA: Diagnosis not present

## 2024-01-08 DIAGNOSIS — E119 Type 2 diabetes mellitus without complications: Secondary | ICD-10-CM | POA: Diagnosis not present

## 2024-01-08 DIAGNOSIS — I110 Hypertensive heart disease with heart failure: Secondary | ICD-10-CM | POA: Diagnosis not present

## 2024-01-08 DIAGNOSIS — R630 Anorexia: Secondary | ICD-10-CM | POA: Diagnosis not present

## 2024-01-08 DIAGNOSIS — G8194 Hemiplegia, unspecified affecting left nondominant side: Secondary | ICD-10-CM | POA: Diagnosis not present

## 2024-01-08 DIAGNOSIS — I951 Orthostatic hypotension: Secondary | ICD-10-CM | POA: Diagnosis not present

## 2024-01-08 DIAGNOSIS — G20A1 Parkinson's disease without dyskinesia, without mention of fluctuations: Secondary | ICD-10-CM | POA: Diagnosis not present

## 2024-01-09 ENCOUNTER — Encounter: Payer: Self-pay | Admitting: *Deleted

## 2024-01-11 DIAGNOSIS — E119 Type 2 diabetes mellitus without complications: Secondary | ICD-10-CM | POA: Diagnosis not present

## 2024-01-11 DIAGNOSIS — I110 Hypertensive heart disease with heart failure: Secondary | ICD-10-CM | POA: Diagnosis not present

## 2024-01-11 DIAGNOSIS — G8194 Hemiplegia, unspecified affecting left nondominant side: Secondary | ICD-10-CM | POA: Diagnosis not present

## 2024-01-11 DIAGNOSIS — R630 Anorexia: Secondary | ICD-10-CM | POA: Diagnosis not present

## 2024-01-11 DIAGNOSIS — I951 Orthostatic hypotension: Secondary | ICD-10-CM | POA: Diagnosis not present

## 2024-01-11 DIAGNOSIS — G20A1 Parkinson's disease without dyskinesia, without mention of fluctuations: Secondary | ICD-10-CM | POA: Diagnosis not present

## 2024-01-12 DIAGNOSIS — I951 Orthostatic hypotension: Secondary | ICD-10-CM | POA: Diagnosis not present

## 2024-01-12 DIAGNOSIS — G20A1 Parkinson's disease without dyskinesia, without mention of fluctuations: Secondary | ICD-10-CM | POA: Diagnosis not present

## 2024-01-12 DIAGNOSIS — G8194 Hemiplegia, unspecified affecting left nondominant side: Secondary | ICD-10-CM | POA: Diagnosis not present

## 2024-01-12 DIAGNOSIS — R630 Anorexia: Secondary | ICD-10-CM | POA: Diagnosis not present

## 2024-01-12 DIAGNOSIS — I110 Hypertensive heart disease with heart failure: Secondary | ICD-10-CM | POA: Diagnosis not present

## 2024-01-12 DIAGNOSIS — E119 Type 2 diabetes mellitus without complications: Secondary | ICD-10-CM | POA: Diagnosis not present

## 2024-01-15 DIAGNOSIS — E119 Type 2 diabetes mellitus without complications: Secondary | ICD-10-CM | POA: Diagnosis not present

## 2024-01-15 DIAGNOSIS — G8194 Hemiplegia, unspecified affecting left nondominant side: Secondary | ICD-10-CM | POA: Diagnosis not present

## 2024-01-15 DIAGNOSIS — I110 Hypertensive heart disease with heart failure: Secondary | ICD-10-CM | POA: Diagnosis not present

## 2024-01-15 DIAGNOSIS — G20A1 Parkinson's disease without dyskinesia, without mention of fluctuations: Secondary | ICD-10-CM | POA: Diagnosis not present

## 2024-01-15 DIAGNOSIS — R630 Anorexia: Secondary | ICD-10-CM | POA: Diagnosis not present

## 2024-01-15 DIAGNOSIS — I951 Orthostatic hypotension: Secondary | ICD-10-CM | POA: Diagnosis not present

## 2024-01-17 DIAGNOSIS — I951 Orthostatic hypotension: Secondary | ICD-10-CM | POA: Diagnosis not present

## 2024-01-17 DIAGNOSIS — I110 Hypertensive heart disease with heart failure: Secondary | ICD-10-CM | POA: Diagnosis not present

## 2024-01-17 DIAGNOSIS — G8194 Hemiplegia, unspecified affecting left nondominant side: Secondary | ICD-10-CM | POA: Diagnosis not present

## 2024-01-17 DIAGNOSIS — G20A1 Parkinson's disease without dyskinesia, without mention of fluctuations: Secondary | ICD-10-CM | POA: Diagnosis not present

## 2024-01-17 DIAGNOSIS — R630 Anorexia: Secondary | ICD-10-CM | POA: Diagnosis not present

## 2024-01-17 DIAGNOSIS — E119 Type 2 diabetes mellitus without complications: Secondary | ICD-10-CM | POA: Diagnosis not present

## 2024-01-18 DIAGNOSIS — R630 Anorexia: Secondary | ICD-10-CM | POA: Diagnosis not present

## 2024-01-18 DIAGNOSIS — E119 Type 2 diabetes mellitus without complications: Secondary | ICD-10-CM | POA: Diagnosis not present

## 2024-01-18 DIAGNOSIS — G8194 Hemiplegia, unspecified affecting left nondominant side: Secondary | ICD-10-CM | POA: Diagnosis not present

## 2024-01-18 DIAGNOSIS — I110 Hypertensive heart disease with heart failure: Secondary | ICD-10-CM | POA: Diagnosis not present

## 2024-01-18 DIAGNOSIS — I951 Orthostatic hypotension: Secondary | ICD-10-CM | POA: Diagnosis not present

## 2024-01-18 DIAGNOSIS — G20A1 Parkinson's disease without dyskinesia, without mention of fluctuations: Secondary | ICD-10-CM | POA: Diagnosis not present

## 2024-01-18 NOTE — Progress Notes (Signed)
 Remote ICD transmission.

## 2024-01-19 DIAGNOSIS — R630 Anorexia: Secondary | ICD-10-CM | POA: Diagnosis not present

## 2024-01-19 DIAGNOSIS — G8194 Hemiplegia, unspecified affecting left nondominant side: Secondary | ICD-10-CM | POA: Diagnosis not present

## 2024-01-19 DIAGNOSIS — I110 Hypertensive heart disease with heart failure: Secondary | ICD-10-CM | POA: Diagnosis not present

## 2024-01-19 DIAGNOSIS — G20A1 Parkinson's disease without dyskinesia, without mention of fluctuations: Secondary | ICD-10-CM | POA: Diagnosis not present

## 2024-01-19 DIAGNOSIS — E119 Type 2 diabetes mellitus without complications: Secondary | ICD-10-CM | POA: Diagnosis not present

## 2024-01-19 DIAGNOSIS — I951 Orthostatic hypotension: Secondary | ICD-10-CM | POA: Diagnosis not present

## 2024-01-22 DIAGNOSIS — G20A1 Parkinson's disease without dyskinesia, without mention of fluctuations: Secondary | ICD-10-CM | POA: Diagnosis not present

## 2024-01-22 DIAGNOSIS — I110 Hypertensive heart disease with heart failure: Secondary | ICD-10-CM | POA: Diagnosis not present

## 2024-01-22 DIAGNOSIS — R630 Anorexia: Secondary | ICD-10-CM | POA: Diagnosis not present

## 2024-01-22 DIAGNOSIS — I951 Orthostatic hypotension: Secondary | ICD-10-CM | POA: Diagnosis not present

## 2024-01-22 DIAGNOSIS — E119 Type 2 diabetes mellitus without complications: Secondary | ICD-10-CM | POA: Diagnosis not present

## 2024-01-22 DIAGNOSIS — G8194 Hemiplegia, unspecified affecting left nondominant side: Secondary | ICD-10-CM | POA: Diagnosis not present

## 2024-01-24 DIAGNOSIS — R630 Anorexia: Secondary | ICD-10-CM | POA: Diagnosis not present

## 2024-01-24 DIAGNOSIS — E119 Type 2 diabetes mellitus without complications: Secondary | ICD-10-CM | POA: Diagnosis not present

## 2024-01-24 DIAGNOSIS — I951 Orthostatic hypotension: Secondary | ICD-10-CM | POA: Diagnosis not present

## 2024-01-24 DIAGNOSIS — G20A1 Parkinson's disease without dyskinesia, without mention of fluctuations: Secondary | ICD-10-CM | POA: Diagnosis not present

## 2024-01-24 DIAGNOSIS — I110 Hypertensive heart disease with heart failure: Secondary | ICD-10-CM | POA: Diagnosis not present

## 2024-01-24 DIAGNOSIS — G8194 Hemiplegia, unspecified affecting left nondominant side: Secondary | ICD-10-CM | POA: Diagnosis not present

## 2024-01-26 DIAGNOSIS — R630 Anorexia: Secondary | ICD-10-CM | POA: Diagnosis not present

## 2024-01-26 DIAGNOSIS — G8194 Hemiplegia, unspecified affecting left nondominant side: Secondary | ICD-10-CM | POA: Diagnosis not present

## 2024-01-26 DIAGNOSIS — G20A1 Parkinson's disease without dyskinesia, without mention of fluctuations: Secondary | ICD-10-CM | POA: Diagnosis not present

## 2024-01-26 DIAGNOSIS — E119 Type 2 diabetes mellitus without complications: Secondary | ICD-10-CM | POA: Diagnosis not present

## 2024-01-26 DIAGNOSIS — I110 Hypertensive heart disease with heart failure: Secondary | ICD-10-CM | POA: Diagnosis not present

## 2024-01-26 DIAGNOSIS — I951 Orthostatic hypotension: Secondary | ICD-10-CM | POA: Diagnosis not present

## 2024-01-27 DIAGNOSIS — E119 Type 2 diabetes mellitus without complications: Secondary | ICD-10-CM | POA: Diagnosis not present

## 2024-01-27 DIAGNOSIS — G20A1 Parkinson's disease without dyskinesia, without mention of fluctuations: Secondary | ICD-10-CM | POA: Diagnosis not present

## 2024-01-27 DIAGNOSIS — G8194 Hemiplegia, unspecified affecting left nondominant side: Secondary | ICD-10-CM | POA: Diagnosis not present

## 2024-01-27 DIAGNOSIS — R630 Anorexia: Secondary | ICD-10-CM | POA: Diagnosis not present

## 2024-01-27 DIAGNOSIS — I110 Hypertensive heart disease with heart failure: Secondary | ICD-10-CM | POA: Diagnosis not present

## 2024-01-27 DIAGNOSIS — I951 Orthostatic hypotension: Secondary | ICD-10-CM | POA: Diagnosis not present

## 2024-01-28 DIAGNOSIS — G8194 Hemiplegia, unspecified affecting left nondominant side: Secondary | ICD-10-CM | POA: Diagnosis not present

## 2024-01-28 DIAGNOSIS — E119 Type 2 diabetes mellitus without complications: Secondary | ICD-10-CM | POA: Diagnosis not present

## 2024-01-28 DIAGNOSIS — I951 Orthostatic hypotension: Secondary | ICD-10-CM | POA: Diagnosis not present

## 2024-01-28 DIAGNOSIS — R630 Anorexia: Secondary | ICD-10-CM | POA: Diagnosis not present

## 2024-01-28 DIAGNOSIS — G20A1 Parkinson's disease without dyskinesia, without mention of fluctuations: Secondary | ICD-10-CM | POA: Diagnosis not present

## 2024-01-28 DIAGNOSIS — I110 Hypertensive heart disease with heart failure: Secondary | ICD-10-CM | POA: Diagnosis not present

## 2024-01-29 DIAGNOSIS — G20A1 Parkinson's disease without dyskinesia, without mention of fluctuations: Secondary | ICD-10-CM | POA: Diagnosis not present

## 2024-01-29 DIAGNOSIS — E119 Type 2 diabetes mellitus without complications: Secondary | ICD-10-CM | POA: Diagnosis not present

## 2024-01-29 DIAGNOSIS — I951 Orthostatic hypotension: Secondary | ICD-10-CM | POA: Diagnosis not present

## 2024-01-29 DIAGNOSIS — R630 Anorexia: Secondary | ICD-10-CM | POA: Diagnosis not present

## 2024-01-29 DIAGNOSIS — I110 Hypertensive heart disease with heart failure: Secondary | ICD-10-CM | POA: Diagnosis not present

## 2024-01-29 DIAGNOSIS — G8194 Hemiplegia, unspecified affecting left nondominant side: Secondary | ICD-10-CM | POA: Diagnosis not present

## 2024-01-30 DIAGNOSIS — E119 Type 2 diabetes mellitus without complications: Secondary | ICD-10-CM | POA: Diagnosis not present

## 2024-01-30 DIAGNOSIS — I509 Heart failure, unspecified: Secondary | ICD-10-CM | POA: Diagnosis not present

## 2024-01-30 DIAGNOSIS — G8194 Hemiplegia, unspecified affecting left nondominant side: Secondary | ICD-10-CM | POA: Diagnosis not present

## 2024-01-30 DIAGNOSIS — R000 Tachycardia, unspecified: Secondary | ICD-10-CM | POA: Diagnosis not present

## 2024-01-30 DIAGNOSIS — M479 Spondylosis, unspecified: Secondary | ICD-10-CM | POA: Diagnosis not present

## 2024-01-30 DIAGNOSIS — I428 Other cardiomyopathies: Secondary | ICD-10-CM | POA: Diagnosis not present

## 2024-01-30 DIAGNOSIS — G20A1 Parkinson's disease without dyskinesia, without mention of fluctuations: Secondary | ICD-10-CM | POA: Diagnosis not present

## 2024-01-30 DIAGNOSIS — I951 Orthostatic hypotension: Secondary | ICD-10-CM | POA: Diagnosis not present

## 2024-01-30 DIAGNOSIS — I110 Hypertensive heart disease with heart failure: Secondary | ICD-10-CM | POA: Diagnosis not present

## 2024-01-30 DIAGNOSIS — R630 Anorexia: Secondary | ICD-10-CM | POA: Diagnosis not present

## 2024-01-30 DIAGNOSIS — E785 Hyperlipidemia, unspecified: Secondary | ICD-10-CM | POA: Diagnosis not present

## 2024-01-30 DIAGNOSIS — K219 Gastro-esophageal reflux disease without esophagitis: Secondary | ICD-10-CM | POA: Diagnosis not present

## 2024-01-31 DIAGNOSIS — I110 Hypertensive heart disease with heart failure: Secondary | ICD-10-CM | POA: Diagnosis not present

## 2024-01-31 DIAGNOSIS — R630 Anorexia: Secondary | ICD-10-CM | POA: Diagnosis not present

## 2024-01-31 DIAGNOSIS — I951 Orthostatic hypotension: Secondary | ICD-10-CM | POA: Diagnosis not present

## 2024-01-31 DIAGNOSIS — G20A1 Parkinson's disease without dyskinesia, without mention of fluctuations: Secondary | ICD-10-CM | POA: Diagnosis not present

## 2024-01-31 DIAGNOSIS — E119 Type 2 diabetes mellitus without complications: Secondary | ICD-10-CM | POA: Diagnosis not present

## 2024-01-31 DIAGNOSIS — G8194 Hemiplegia, unspecified affecting left nondominant side: Secondary | ICD-10-CM | POA: Diagnosis not present

## 2024-02-01 DIAGNOSIS — R630 Anorexia: Secondary | ICD-10-CM | POA: Diagnosis not present

## 2024-02-01 DIAGNOSIS — G20A1 Parkinson's disease without dyskinesia, without mention of fluctuations: Secondary | ICD-10-CM | POA: Diagnosis not present

## 2024-02-01 DIAGNOSIS — E119 Type 2 diabetes mellitus without complications: Secondary | ICD-10-CM | POA: Diagnosis not present

## 2024-02-01 DIAGNOSIS — G8194 Hemiplegia, unspecified affecting left nondominant side: Secondary | ICD-10-CM | POA: Diagnosis not present

## 2024-02-01 DIAGNOSIS — I110 Hypertensive heart disease with heart failure: Secondary | ICD-10-CM | POA: Diagnosis not present

## 2024-02-01 DIAGNOSIS — I951 Orthostatic hypotension: Secondary | ICD-10-CM | POA: Diagnosis not present

## 2024-02-02 DIAGNOSIS — R630 Anorexia: Secondary | ICD-10-CM | POA: Diagnosis not present

## 2024-02-02 DIAGNOSIS — G20A1 Parkinson's disease without dyskinesia, without mention of fluctuations: Secondary | ICD-10-CM | POA: Diagnosis not present

## 2024-02-02 DIAGNOSIS — I110 Hypertensive heart disease with heart failure: Secondary | ICD-10-CM | POA: Diagnosis not present

## 2024-02-02 DIAGNOSIS — E119 Type 2 diabetes mellitus without complications: Secondary | ICD-10-CM | POA: Diagnosis not present

## 2024-02-02 DIAGNOSIS — I951 Orthostatic hypotension: Secondary | ICD-10-CM | POA: Diagnosis not present

## 2024-02-02 DIAGNOSIS — G8194 Hemiplegia, unspecified affecting left nondominant side: Secondary | ICD-10-CM | POA: Diagnosis not present

## 2024-02-05 DIAGNOSIS — R630 Anorexia: Secondary | ICD-10-CM | POA: Diagnosis not present

## 2024-02-05 DIAGNOSIS — G20A1 Parkinson's disease without dyskinesia, without mention of fluctuations: Secondary | ICD-10-CM | POA: Diagnosis not present

## 2024-02-05 DIAGNOSIS — G8194 Hemiplegia, unspecified affecting left nondominant side: Secondary | ICD-10-CM | POA: Diagnosis not present

## 2024-02-05 DIAGNOSIS — I951 Orthostatic hypotension: Secondary | ICD-10-CM | POA: Diagnosis not present

## 2024-02-05 DIAGNOSIS — E119 Type 2 diabetes mellitus without complications: Secondary | ICD-10-CM | POA: Diagnosis not present

## 2024-02-05 DIAGNOSIS — I110 Hypertensive heart disease with heart failure: Secondary | ICD-10-CM | POA: Diagnosis not present

## 2024-02-07 DIAGNOSIS — E119 Type 2 diabetes mellitus without complications: Secondary | ICD-10-CM | POA: Diagnosis not present

## 2024-02-07 DIAGNOSIS — G20A1 Parkinson's disease without dyskinesia, without mention of fluctuations: Secondary | ICD-10-CM | POA: Diagnosis not present

## 2024-02-07 DIAGNOSIS — R630 Anorexia: Secondary | ICD-10-CM | POA: Diagnosis not present

## 2024-02-07 DIAGNOSIS — G8194 Hemiplegia, unspecified affecting left nondominant side: Secondary | ICD-10-CM | POA: Diagnosis not present

## 2024-02-07 DIAGNOSIS — I110 Hypertensive heart disease with heart failure: Secondary | ICD-10-CM | POA: Diagnosis not present

## 2024-02-07 DIAGNOSIS — I951 Orthostatic hypotension: Secondary | ICD-10-CM | POA: Diagnosis not present

## 2024-02-08 ENCOUNTER — Ambulatory Visit: Admitting: Podiatry

## 2024-02-09 DIAGNOSIS — I951 Orthostatic hypotension: Secondary | ICD-10-CM | POA: Diagnosis not present

## 2024-02-09 DIAGNOSIS — E119 Type 2 diabetes mellitus without complications: Secondary | ICD-10-CM | POA: Diagnosis not present

## 2024-02-09 DIAGNOSIS — G8194 Hemiplegia, unspecified affecting left nondominant side: Secondary | ICD-10-CM | POA: Diagnosis not present

## 2024-02-09 DIAGNOSIS — G20A1 Parkinson's disease without dyskinesia, without mention of fluctuations: Secondary | ICD-10-CM | POA: Diagnosis not present

## 2024-02-09 DIAGNOSIS — R630 Anorexia: Secondary | ICD-10-CM | POA: Diagnosis not present

## 2024-02-09 DIAGNOSIS — I110 Hypertensive heart disease with heart failure: Secondary | ICD-10-CM | POA: Diagnosis not present

## 2024-02-13 DIAGNOSIS — G8194 Hemiplegia, unspecified affecting left nondominant side: Secondary | ICD-10-CM | POA: Diagnosis not present

## 2024-02-13 DIAGNOSIS — E119 Type 2 diabetes mellitus without complications: Secondary | ICD-10-CM | POA: Diagnosis not present

## 2024-02-13 DIAGNOSIS — R630 Anorexia: Secondary | ICD-10-CM | POA: Diagnosis not present

## 2024-02-13 DIAGNOSIS — G20A1 Parkinson's disease without dyskinesia, without mention of fluctuations: Secondary | ICD-10-CM | POA: Diagnosis not present

## 2024-02-13 DIAGNOSIS — I951 Orthostatic hypotension: Secondary | ICD-10-CM | POA: Diagnosis not present

## 2024-02-13 DIAGNOSIS — I110 Hypertensive heart disease with heart failure: Secondary | ICD-10-CM | POA: Diagnosis not present

## 2024-02-14 DIAGNOSIS — G20A1 Parkinson's disease without dyskinesia, without mention of fluctuations: Secondary | ICD-10-CM | POA: Diagnosis not present

## 2024-02-14 DIAGNOSIS — G8194 Hemiplegia, unspecified affecting left nondominant side: Secondary | ICD-10-CM | POA: Diagnosis not present

## 2024-02-14 DIAGNOSIS — I110 Hypertensive heart disease with heart failure: Secondary | ICD-10-CM | POA: Diagnosis not present

## 2024-02-14 DIAGNOSIS — R630 Anorexia: Secondary | ICD-10-CM | POA: Diagnosis not present

## 2024-02-14 DIAGNOSIS — I951 Orthostatic hypotension: Secondary | ICD-10-CM | POA: Diagnosis not present

## 2024-02-14 DIAGNOSIS — E119 Type 2 diabetes mellitus without complications: Secondary | ICD-10-CM | POA: Diagnosis not present

## 2024-02-15 DIAGNOSIS — I951 Orthostatic hypotension: Secondary | ICD-10-CM | POA: Diagnosis not present

## 2024-02-15 DIAGNOSIS — E119 Type 2 diabetes mellitus without complications: Secondary | ICD-10-CM | POA: Diagnosis not present

## 2024-02-15 DIAGNOSIS — R630 Anorexia: Secondary | ICD-10-CM | POA: Diagnosis not present

## 2024-02-15 DIAGNOSIS — G20A1 Parkinson's disease without dyskinesia, without mention of fluctuations: Secondary | ICD-10-CM | POA: Diagnosis not present

## 2024-02-15 DIAGNOSIS — G8194 Hemiplegia, unspecified affecting left nondominant side: Secondary | ICD-10-CM | POA: Diagnosis not present

## 2024-02-15 DIAGNOSIS — I110 Hypertensive heart disease with heart failure: Secondary | ICD-10-CM | POA: Diagnosis not present

## 2024-02-16 DIAGNOSIS — G20A1 Parkinson's disease without dyskinesia, without mention of fluctuations: Secondary | ICD-10-CM | POA: Diagnosis not present

## 2024-02-16 DIAGNOSIS — I951 Orthostatic hypotension: Secondary | ICD-10-CM | POA: Diagnosis not present

## 2024-02-16 DIAGNOSIS — R630 Anorexia: Secondary | ICD-10-CM | POA: Diagnosis not present

## 2024-02-16 DIAGNOSIS — E119 Type 2 diabetes mellitus without complications: Secondary | ICD-10-CM | POA: Diagnosis not present

## 2024-02-16 DIAGNOSIS — G8194 Hemiplegia, unspecified affecting left nondominant side: Secondary | ICD-10-CM | POA: Diagnosis not present

## 2024-02-16 DIAGNOSIS — I110 Hypertensive heart disease with heart failure: Secondary | ICD-10-CM | POA: Diagnosis not present

## 2024-02-19 DIAGNOSIS — I110 Hypertensive heart disease with heart failure: Secondary | ICD-10-CM | POA: Diagnosis not present

## 2024-02-19 DIAGNOSIS — R630 Anorexia: Secondary | ICD-10-CM | POA: Diagnosis not present

## 2024-02-19 DIAGNOSIS — G20A1 Parkinson's disease without dyskinesia, without mention of fluctuations: Secondary | ICD-10-CM | POA: Diagnosis not present

## 2024-02-19 DIAGNOSIS — I951 Orthostatic hypotension: Secondary | ICD-10-CM | POA: Diagnosis not present

## 2024-02-19 DIAGNOSIS — G8194 Hemiplegia, unspecified affecting left nondominant side: Secondary | ICD-10-CM | POA: Diagnosis not present

## 2024-02-19 DIAGNOSIS — E119 Type 2 diabetes mellitus without complications: Secondary | ICD-10-CM | POA: Diagnosis not present

## 2024-02-20 DIAGNOSIS — I110 Hypertensive heart disease with heart failure: Secondary | ICD-10-CM | POA: Diagnosis not present

## 2024-02-20 DIAGNOSIS — R630 Anorexia: Secondary | ICD-10-CM | POA: Diagnosis not present

## 2024-02-20 DIAGNOSIS — G8194 Hemiplegia, unspecified affecting left nondominant side: Secondary | ICD-10-CM | POA: Diagnosis not present

## 2024-02-20 DIAGNOSIS — E119 Type 2 diabetes mellitus without complications: Secondary | ICD-10-CM | POA: Diagnosis not present

## 2024-02-20 DIAGNOSIS — I951 Orthostatic hypotension: Secondary | ICD-10-CM | POA: Diagnosis not present

## 2024-02-20 DIAGNOSIS — G20A1 Parkinson's disease without dyskinesia, without mention of fluctuations: Secondary | ICD-10-CM | POA: Diagnosis not present

## 2024-02-21 DIAGNOSIS — E119 Type 2 diabetes mellitus without complications: Secondary | ICD-10-CM | POA: Diagnosis not present

## 2024-02-21 DIAGNOSIS — R630 Anorexia: Secondary | ICD-10-CM | POA: Diagnosis not present

## 2024-02-21 DIAGNOSIS — I951 Orthostatic hypotension: Secondary | ICD-10-CM | POA: Diagnosis not present

## 2024-02-21 DIAGNOSIS — G20A1 Parkinson's disease without dyskinesia, without mention of fluctuations: Secondary | ICD-10-CM | POA: Diagnosis not present

## 2024-02-21 DIAGNOSIS — G8194 Hemiplegia, unspecified affecting left nondominant side: Secondary | ICD-10-CM | POA: Diagnosis not present

## 2024-02-21 DIAGNOSIS — I110 Hypertensive heart disease with heart failure: Secondary | ICD-10-CM | POA: Diagnosis not present

## 2024-02-22 DIAGNOSIS — R630 Anorexia: Secondary | ICD-10-CM | POA: Diagnosis not present

## 2024-02-22 DIAGNOSIS — G20A1 Parkinson's disease without dyskinesia, without mention of fluctuations: Secondary | ICD-10-CM | POA: Diagnosis not present

## 2024-02-22 DIAGNOSIS — G8194 Hemiplegia, unspecified affecting left nondominant side: Secondary | ICD-10-CM | POA: Diagnosis not present

## 2024-02-22 DIAGNOSIS — E119 Type 2 diabetes mellitus without complications: Secondary | ICD-10-CM | POA: Diagnosis not present

## 2024-02-22 DIAGNOSIS — I110 Hypertensive heart disease with heart failure: Secondary | ICD-10-CM | POA: Diagnosis not present

## 2024-02-22 DIAGNOSIS — I951 Orthostatic hypotension: Secondary | ICD-10-CM | POA: Diagnosis not present

## 2024-02-23 DIAGNOSIS — I951 Orthostatic hypotension: Secondary | ICD-10-CM | POA: Diagnosis not present

## 2024-02-23 DIAGNOSIS — G8194 Hemiplegia, unspecified affecting left nondominant side: Secondary | ICD-10-CM | POA: Diagnosis not present

## 2024-02-23 DIAGNOSIS — R630 Anorexia: Secondary | ICD-10-CM | POA: Diagnosis not present

## 2024-02-23 DIAGNOSIS — E119 Type 2 diabetes mellitus without complications: Secondary | ICD-10-CM | POA: Diagnosis not present

## 2024-02-23 DIAGNOSIS — G20A1 Parkinson's disease without dyskinesia, without mention of fluctuations: Secondary | ICD-10-CM | POA: Diagnosis not present

## 2024-02-23 DIAGNOSIS — I110 Hypertensive heart disease with heart failure: Secondary | ICD-10-CM | POA: Diagnosis not present

## 2024-02-26 DIAGNOSIS — G8194 Hemiplegia, unspecified affecting left nondominant side: Secondary | ICD-10-CM | POA: Diagnosis not present

## 2024-02-26 DIAGNOSIS — I110 Hypertensive heart disease with heart failure: Secondary | ICD-10-CM | POA: Diagnosis not present

## 2024-02-26 DIAGNOSIS — G20A1 Parkinson's disease without dyskinesia, without mention of fluctuations: Secondary | ICD-10-CM | POA: Diagnosis not present

## 2024-02-26 DIAGNOSIS — I951 Orthostatic hypotension: Secondary | ICD-10-CM | POA: Diagnosis not present

## 2024-02-26 DIAGNOSIS — R630 Anorexia: Secondary | ICD-10-CM | POA: Diagnosis not present

## 2024-02-26 DIAGNOSIS — E119 Type 2 diabetes mellitus without complications: Secondary | ICD-10-CM | POA: Diagnosis not present

## 2024-02-28 DIAGNOSIS — E119 Type 2 diabetes mellitus without complications: Secondary | ICD-10-CM | POA: Diagnosis not present

## 2024-02-28 DIAGNOSIS — G20A1 Parkinson's disease without dyskinesia, without mention of fluctuations: Secondary | ICD-10-CM | POA: Diagnosis not present

## 2024-02-28 DIAGNOSIS — R630 Anorexia: Secondary | ICD-10-CM | POA: Diagnosis not present

## 2024-02-28 DIAGNOSIS — I110 Hypertensive heart disease with heart failure: Secondary | ICD-10-CM | POA: Diagnosis not present

## 2024-02-28 DIAGNOSIS — I951 Orthostatic hypotension: Secondary | ICD-10-CM | POA: Diagnosis not present

## 2024-02-28 DIAGNOSIS — G8194 Hemiplegia, unspecified affecting left nondominant side: Secondary | ICD-10-CM | POA: Diagnosis not present

## 2024-02-29 ENCOUNTER — Ambulatory Visit (INDEPENDENT_AMBULATORY_CARE_PROVIDER_SITE_OTHER): Payer: Medicare Other

## 2024-02-29 DIAGNOSIS — G8194 Hemiplegia, unspecified affecting left nondominant side: Secondary | ICD-10-CM | POA: Diagnosis not present

## 2024-02-29 DIAGNOSIS — I428 Other cardiomyopathies: Secondary | ICD-10-CM

## 2024-02-29 DIAGNOSIS — I951 Orthostatic hypotension: Secondary | ICD-10-CM | POA: Diagnosis not present

## 2024-02-29 DIAGNOSIS — E119 Type 2 diabetes mellitus without complications: Secondary | ICD-10-CM | POA: Diagnosis not present

## 2024-02-29 DIAGNOSIS — R630 Anorexia: Secondary | ICD-10-CM | POA: Diagnosis not present

## 2024-02-29 DIAGNOSIS — G20A1 Parkinson's disease without dyskinesia, without mention of fluctuations: Secondary | ICD-10-CM | POA: Diagnosis not present

## 2024-02-29 DIAGNOSIS — I110 Hypertensive heart disease with heart failure: Secondary | ICD-10-CM | POA: Diagnosis not present

## 2024-02-29 LAB — CUP PACEART REMOTE DEVICE CHECK
Battery Remaining Longevity: 1 mo
Battery Voltage: 2.77 V
Brady Statistic AP VP Percent: 0 %
Brady Statistic AP VS Percent: 0 %
Brady Statistic AS VP Percent: 0.03 %
Brady Statistic AS VS Percent: 99.97 %
Brady Statistic RA Percent Paced: 0.01 %
Brady Statistic RV Percent Paced: 0.03 %
Date Time Interrogation Session: 20250731001703
HighPow Impedance: 62 Ohm
Implantable Lead Connection Status: 753985
Implantable Lead Connection Status: 753985
Implantable Lead Implant Date: 20150427
Implantable Lead Implant Date: 20150427
Implantable Lead Location: 753859
Implantable Lead Location: 753860
Implantable Lead Model: 5076
Implantable Lead Model: 6935
Implantable Pulse Generator Implant Date: 20150427
Lead Channel Impedance Value: 361 Ohm
Lead Channel Impedance Value: 361 Ohm
Lead Channel Impedance Value: 4047 Ohm
Lead Channel Impedance Value: 4047 Ohm
Lead Channel Impedance Value: 4047 Ohm
Lead Channel Impedance Value: 456 Ohm
Lead Channel Pacing Threshold Amplitude: 0.625 V
Lead Channel Pacing Threshold Amplitude: 0.75 V
Lead Channel Pacing Threshold Pulse Width: 0.4 ms
Lead Channel Pacing Threshold Pulse Width: 0.4 ms
Lead Channel Sensing Intrinsic Amplitude: 1.75 mV
Lead Channel Sensing Intrinsic Amplitude: 1.75 mV
Lead Channel Sensing Intrinsic Amplitude: 22.5 mV
Lead Channel Sensing Intrinsic Amplitude: 22.5 mV
Lead Channel Setting Pacing Amplitude: 2 V
Lead Channel Setting Pacing Amplitude: 2.5 V
Lead Channel Setting Pacing Pulse Width: 0.4 ms
Lead Channel Setting Sensing Sensitivity: 0.3 mV
Zone Setting Status: 755011
Zone Setting Status: 755011
Zone Setting Status: 755011

## 2024-03-01 ENCOUNTER — Ambulatory Visit: Payer: Self-pay | Admitting: Internal Medicine

## 2024-03-01 DIAGNOSIS — M47812 Spondylosis without myelopathy or radiculopathy, cervical region: Secondary | ICD-10-CM | POA: Diagnosis not present

## 2024-03-01 DIAGNOSIS — G20A1 Parkinson's disease without dyskinesia, without mention of fluctuations: Secondary | ICD-10-CM | POA: Diagnosis not present

## 2024-03-01 DIAGNOSIS — I10 Essential (primary) hypertension: Secondary | ICD-10-CM | POA: Diagnosis not present

## 2024-03-01 DIAGNOSIS — I428 Other cardiomyopathies: Secondary | ICD-10-CM | POA: Diagnosis not present

## 2024-03-01 DIAGNOSIS — K219 Gastro-esophageal reflux disease without esophagitis: Secondary | ICD-10-CM | POA: Diagnosis not present

## 2024-03-01 DIAGNOSIS — I951 Orthostatic hypotension: Secondary | ICD-10-CM | POA: Diagnosis not present

## 2024-03-01 DIAGNOSIS — E119 Type 2 diabetes mellitus without complications: Secondary | ICD-10-CM | POA: Diagnosis not present

## 2024-03-01 DIAGNOSIS — E785 Hyperlipidemia, unspecified: Secondary | ICD-10-CM | POA: Diagnosis not present

## 2024-03-01 DIAGNOSIS — G8194 Hemiplegia, unspecified affecting left nondominant side: Secondary | ICD-10-CM | POA: Diagnosis not present

## 2024-03-01 DIAGNOSIS — I509 Heart failure, unspecified: Secondary | ICD-10-CM | POA: Diagnosis not present

## 2024-03-01 DIAGNOSIS — R000 Tachycardia, unspecified: Secondary | ICD-10-CM | POA: Diagnosis not present

## 2024-03-01 DIAGNOSIS — R630 Anorexia: Secondary | ICD-10-CM | POA: Diagnosis not present

## 2024-03-06 DIAGNOSIS — I951 Orthostatic hypotension: Secondary | ICD-10-CM | POA: Diagnosis not present

## 2024-03-06 DIAGNOSIS — I10 Essential (primary) hypertension: Secondary | ICD-10-CM | POA: Diagnosis not present

## 2024-03-06 DIAGNOSIS — E119 Type 2 diabetes mellitus without complications: Secondary | ICD-10-CM | POA: Diagnosis not present

## 2024-03-06 DIAGNOSIS — G20A1 Parkinson's disease without dyskinesia, without mention of fluctuations: Secondary | ICD-10-CM | POA: Diagnosis not present

## 2024-03-06 DIAGNOSIS — G8194 Hemiplegia, unspecified affecting left nondominant side: Secondary | ICD-10-CM | POA: Diagnosis not present

## 2024-03-06 DIAGNOSIS — R630 Anorexia: Secondary | ICD-10-CM | POA: Diagnosis not present

## 2024-03-07 DIAGNOSIS — R630 Anorexia: Secondary | ICD-10-CM | POA: Diagnosis not present

## 2024-03-07 DIAGNOSIS — E119 Type 2 diabetes mellitus without complications: Secondary | ICD-10-CM | POA: Diagnosis not present

## 2024-03-07 DIAGNOSIS — G20A1 Parkinson's disease without dyskinesia, without mention of fluctuations: Secondary | ICD-10-CM | POA: Diagnosis not present

## 2024-03-07 DIAGNOSIS — G8194 Hemiplegia, unspecified affecting left nondominant side: Secondary | ICD-10-CM | POA: Diagnosis not present

## 2024-03-07 DIAGNOSIS — I10 Essential (primary) hypertension: Secondary | ICD-10-CM | POA: Diagnosis not present

## 2024-03-07 DIAGNOSIS — I951 Orthostatic hypotension: Secondary | ICD-10-CM | POA: Diagnosis not present

## 2024-03-08 DIAGNOSIS — G20A1 Parkinson's disease without dyskinesia, without mention of fluctuations: Secondary | ICD-10-CM | POA: Diagnosis not present

## 2024-03-08 DIAGNOSIS — I951 Orthostatic hypotension: Secondary | ICD-10-CM | POA: Diagnosis not present

## 2024-03-08 DIAGNOSIS — E119 Type 2 diabetes mellitus without complications: Secondary | ICD-10-CM | POA: Diagnosis not present

## 2024-03-08 DIAGNOSIS — R630 Anorexia: Secondary | ICD-10-CM | POA: Diagnosis not present

## 2024-03-08 DIAGNOSIS — I10 Essential (primary) hypertension: Secondary | ICD-10-CM | POA: Diagnosis not present

## 2024-03-08 DIAGNOSIS — G8194 Hemiplegia, unspecified affecting left nondominant side: Secondary | ICD-10-CM | POA: Diagnosis not present

## 2024-03-11 DIAGNOSIS — G20A1 Parkinson's disease without dyskinesia, without mention of fluctuations: Secondary | ICD-10-CM | POA: Diagnosis not present

## 2024-03-11 DIAGNOSIS — G8194 Hemiplegia, unspecified affecting left nondominant side: Secondary | ICD-10-CM | POA: Diagnosis not present

## 2024-03-11 DIAGNOSIS — I951 Orthostatic hypotension: Secondary | ICD-10-CM | POA: Diagnosis not present

## 2024-03-11 DIAGNOSIS — R630 Anorexia: Secondary | ICD-10-CM | POA: Diagnosis not present

## 2024-03-11 DIAGNOSIS — E119 Type 2 diabetes mellitus without complications: Secondary | ICD-10-CM | POA: Diagnosis not present

## 2024-03-11 DIAGNOSIS — I10 Essential (primary) hypertension: Secondary | ICD-10-CM | POA: Diagnosis not present

## 2024-03-13 DIAGNOSIS — E119 Type 2 diabetes mellitus without complications: Secondary | ICD-10-CM | POA: Diagnosis not present

## 2024-03-13 DIAGNOSIS — I951 Orthostatic hypotension: Secondary | ICD-10-CM | POA: Diagnosis not present

## 2024-03-13 DIAGNOSIS — G8194 Hemiplegia, unspecified affecting left nondominant side: Secondary | ICD-10-CM | POA: Diagnosis not present

## 2024-03-13 DIAGNOSIS — I10 Essential (primary) hypertension: Secondary | ICD-10-CM | POA: Diagnosis not present

## 2024-03-13 DIAGNOSIS — R630 Anorexia: Secondary | ICD-10-CM | POA: Diagnosis not present

## 2024-03-13 DIAGNOSIS — G20A1 Parkinson's disease without dyskinesia, without mention of fluctuations: Secondary | ICD-10-CM | POA: Diagnosis not present

## 2024-03-14 DIAGNOSIS — G8194 Hemiplegia, unspecified affecting left nondominant side: Secondary | ICD-10-CM | POA: Diagnosis not present

## 2024-03-14 DIAGNOSIS — I951 Orthostatic hypotension: Secondary | ICD-10-CM | POA: Diagnosis not present

## 2024-03-14 DIAGNOSIS — R630 Anorexia: Secondary | ICD-10-CM | POA: Diagnosis not present

## 2024-03-14 DIAGNOSIS — I10 Essential (primary) hypertension: Secondary | ICD-10-CM | POA: Diagnosis not present

## 2024-03-14 DIAGNOSIS — E119 Type 2 diabetes mellitus without complications: Secondary | ICD-10-CM | POA: Diagnosis not present

## 2024-03-14 DIAGNOSIS — G20A1 Parkinson's disease without dyskinesia, without mention of fluctuations: Secondary | ICD-10-CM | POA: Diagnosis not present

## 2024-03-15 DIAGNOSIS — E119 Type 2 diabetes mellitus without complications: Secondary | ICD-10-CM | POA: Diagnosis not present

## 2024-03-15 DIAGNOSIS — R630 Anorexia: Secondary | ICD-10-CM | POA: Diagnosis not present

## 2024-03-15 DIAGNOSIS — G8194 Hemiplegia, unspecified affecting left nondominant side: Secondary | ICD-10-CM | POA: Diagnosis not present

## 2024-03-15 DIAGNOSIS — G20A1 Parkinson's disease without dyskinesia, without mention of fluctuations: Secondary | ICD-10-CM | POA: Diagnosis not present

## 2024-03-15 DIAGNOSIS — I951 Orthostatic hypotension: Secondary | ICD-10-CM | POA: Diagnosis not present

## 2024-03-15 DIAGNOSIS — I10 Essential (primary) hypertension: Secondary | ICD-10-CM | POA: Diagnosis not present

## 2024-03-18 DIAGNOSIS — G20A1 Parkinson's disease without dyskinesia, without mention of fluctuations: Secondary | ICD-10-CM | POA: Diagnosis not present

## 2024-03-18 DIAGNOSIS — I10 Essential (primary) hypertension: Secondary | ICD-10-CM | POA: Diagnosis not present

## 2024-03-18 DIAGNOSIS — I951 Orthostatic hypotension: Secondary | ICD-10-CM | POA: Diagnosis not present

## 2024-03-18 DIAGNOSIS — G8194 Hemiplegia, unspecified affecting left nondominant side: Secondary | ICD-10-CM | POA: Diagnosis not present

## 2024-03-18 DIAGNOSIS — E119 Type 2 diabetes mellitus without complications: Secondary | ICD-10-CM | POA: Diagnosis not present

## 2024-03-18 DIAGNOSIS — R630 Anorexia: Secondary | ICD-10-CM | POA: Diagnosis not present

## 2024-03-22 DIAGNOSIS — E119 Type 2 diabetes mellitus without complications: Secondary | ICD-10-CM | POA: Diagnosis not present

## 2024-03-22 DIAGNOSIS — I10 Essential (primary) hypertension: Secondary | ICD-10-CM | POA: Diagnosis not present

## 2024-03-22 DIAGNOSIS — I951 Orthostatic hypotension: Secondary | ICD-10-CM | POA: Diagnosis not present

## 2024-03-22 DIAGNOSIS — G20A1 Parkinson's disease without dyskinesia, without mention of fluctuations: Secondary | ICD-10-CM | POA: Diagnosis not present

## 2024-03-22 DIAGNOSIS — R630 Anorexia: Secondary | ICD-10-CM | POA: Diagnosis not present

## 2024-03-22 DIAGNOSIS — G8194 Hemiplegia, unspecified affecting left nondominant side: Secondary | ICD-10-CM | POA: Diagnosis not present

## 2024-03-24 ENCOUNTER — Encounter (HOSPITAL_COMMUNITY): Payer: Self-pay | Admitting: Emergency Medicine

## 2024-03-24 ENCOUNTER — Inpatient Hospital Stay (HOSPITAL_COMMUNITY)
Admission: EM | Admit: 2024-03-24 | Discharge: 2024-03-26 | DRG: 291 | Disposition: A | Discharge status: Hospice | Attending: Family Medicine | Admitting: Family Medicine

## 2024-03-24 ENCOUNTER — Emergency Department (HOSPITAL_COMMUNITY)

## 2024-03-24 DIAGNOSIS — G8929 Other chronic pain: Secondary | ICD-10-CM | POA: Diagnosis present

## 2024-03-24 DIAGNOSIS — Z79899 Other long term (current) drug therapy: Secondary | ICD-10-CM

## 2024-03-24 DIAGNOSIS — Z6820 Body mass index (BMI) 20.0-20.9, adult: Secondary | ICD-10-CM

## 2024-03-24 DIAGNOSIS — K5900 Constipation, unspecified: Secondary | ICD-10-CM | POA: Diagnosis present

## 2024-03-24 DIAGNOSIS — I509 Heart failure, unspecified: Principal | ICD-10-CM

## 2024-03-24 DIAGNOSIS — I428 Other cardiomyopathies: Secondary | ICD-10-CM | POA: Diagnosis present

## 2024-03-24 DIAGNOSIS — I951 Orthostatic hypotension: Secondary | ICD-10-CM | POA: Diagnosis not present

## 2024-03-24 DIAGNOSIS — E1143 Type 2 diabetes mellitus with diabetic autonomic (poly)neuropathy: Secondary | ICD-10-CM | POA: Diagnosis present

## 2024-03-24 DIAGNOSIS — I130 Hypertensive heart and chronic kidney disease with heart failure and stage 1 through stage 4 chronic kidney disease, or unspecified chronic kidney disease: Secondary | ICD-10-CM | POA: Diagnosis not present

## 2024-03-24 DIAGNOSIS — G20A1 Parkinson's disease without dyskinesia, without mention of fluctuations: Secondary | ICD-10-CM | POA: Diagnosis present

## 2024-03-24 DIAGNOSIS — R0602 Shortness of breath: Secondary | ICD-10-CM | POA: Diagnosis not present

## 2024-03-24 DIAGNOSIS — I447 Left bundle-branch block, unspecified: Secondary | ICD-10-CM | POA: Diagnosis present

## 2024-03-24 DIAGNOSIS — N1831 Chronic kidney disease, stage 3a: Secondary | ICD-10-CM | POA: Diagnosis present

## 2024-03-24 DIAGNOSIS — Z880 Allergy status to penicillin: Secondary | ICD-10-CM

## 2024-03-24 DIAGNOSIS — R0603 Acute respiratory distress: Secondary | ICD-10-CM | POA: Insufficient documentation

## 2024-03-24 DIAGNOSIS — R7989 Other specified abnormal findings of blood chemistry: Secondary | ICD-10-CM | POA: Insufficient documentation

## 2024-03-24 DIAGNOSIS — Z66 Do not resuscitate: Secondary | ICD-10-CM | POA: Diagnosis present

## 2024-03-24 DIAGNOSIS — R000 Tachycardia, unspecified: Secondary | ICD-10-CM | POA: Diagnosis present

## 2024-03-24 DIAGNOSIS — Z9981 Dependence on supplemental oxygen: Secondary | ICD-10-CM

## 2024-03-24 DIAGNOSIS — I10 Essential (primary) hypertension: Secondary | ICD-10-CM | POA: Diagnosis not present

## 2024-03-24 DIAGNOSIS — J9610 Chronic respiratory failure, unspecified whether with hypoxia or hypercapnia: Secondary | ICD-10-CM | POA: Diagnosis present

## 2024-03-24 DIAGNOSIS — R64 Cachexia: Secondary | ICD-10-CM | POA: Diagnosis present

## 2024-03-24 DIAGNOSIS — G4733 Obstructive sleep apnea (adult) (pediatric): Secondary | ICD-10-CM | POA: Diagnosis present

## 2024-03-24 DIAGNOSIS — R54 Age-related physical debility: Secondary | ICD-10-CM | POA: Diagnosis present

## 2024-03-24 DIAGNOSIS — R069 Unspecified abnormalities of breathing: Secondary | ICD-10-CM | POA: Diagnosis not present

## 2024-03-24 DIAGNOSIS — E119 Type 2 diabetes mellitus without complications: Secondary | ICD-10-CM | POA: Diagnosis not present

## 2024-03-24 DIAGNOSIS — E1122 Type 2 diabetes mellitus with diabetic chronic kidney disease: Secondary | ICD-10-CM | POA: Diagnosis present

## 2024-03-24 DIAGNOSIS — Z7189 Other specified counseling: Secondary | ICD-10-CM

## 2024-03-24 DIAGNOSIS — Z789 Other specified health status: Secondary | ICD-10-CM

## 2024-03-24 DIAGNOSIS — I2489 Other forms of acute ischemic heart disease: Secondary | ICD-10-CM | POA: Diagnosis present

## 2024-03-24 DIAGNOSIS — N179 Acute kidney failure, unspecified: Secondary | ICD-10-CM | POA: Diagnosis present

## 2024-03-24 DIAGNOSIS — R519 Headache, unspecified: Secondary | ICD-10-CM | POA: Diagnosis present

## 2024-03-24 DIAGNOSIS — Z681 Body mass index (BMI) 19 or less, adult: Secondary | ICD-10-CM

## 2024-03-24 DIAGNOSIS — I5043 Acute on chronic combined systolic (congestive) and diastolic (congestive) heart failure: Secondary | ICD-10-CM | POA: Diagnosis present

## 2024-03-24 DIAGNOSIS — J4489 Other specified chronic obstructive pulmonary disease: Secondary | ICD-10-CM | POA: Diagnosis present

## 2024-03-24 DIAGNOSIS — I4891 Unspecified atrial fibrillation: Secondary | ICD-10-CM | POA: Diagnosis not present

## 2024-03-24 DIAGNOSIS — G4489 Other headache syndrome: Secondary | ICD-10-CM | POA: Diagnosis not present

## 2024-03-24 DIAGNOSIS — G8194 Hemiplegia, unspecified affecting left nondominant side: Secondary | ICD-10-CM | POA: Diagnosis not present

## 2024-03-24 DIAGNOSIS — Z886 Allergy status to analgesic agent status: Secondary | ICD-10-CM

## 2024-03-24 DIAGNOSIS — Z888 Allergy status to other drugs, medicaments and biological substances status: Secondary | ICD-10-CM

## 2024-03-24 DIAGNOSIS — Z515 Encounter for palliative care: Secondary | ICD-10-CM

## 2024-03-24 DIAGNOSIS — R630 Anorexia: Secondary | ICD-10-CM | POA: Diagnosis not present

## 2024-03-24 DIAGNOSIS — Z7984 Long term (current) use of oral hypoglycemic drugs: Secondary | ICD-10-CM

## 2024-03-24 DIAGNOSIS — E785 Hyperlipidemia, unspecified: Secondary | ICD-10-CM | POA: Diagnosis present

## 2024-03-24 DIAGNOSIS — I213 ST elevation (STEMI) myocardial infarction of unspecified site: Secondary | ICD-10-CM | POA: Diagnosis not present

## 2024-03-24 DIAGNOSIS — R0600 Dyspnea, unspecified: Secondary | ICD-10-CM | POA: Insufficient documentation

## 2024-03-24 LAB — BRAIN NATRIURETIC PEPTIDE: B Natriuretic Peptide: 1206.7 pg/mL — ABNORMAL HIGH (ref 0.0–100.0)

## 2024-03-24 LAB — CBC WITH DIFFERENTIAL/PLATELET
Abs Immature Granulocytes: 0.02 K/uL (ref 0.00–0.07)
Basophils Absolute: 0.1 K/uL (ref 0.0–0.1)
Basophils Relative: 1 %
Eosinophils Absolute: 0.3 K/uL (ref 0.0–0.5)
Eosinophils Relative: 4 %
HCT: 37.7 % (ref 36.0–46.0)
Hemoglobin: 11.9 g/dL — ABNORMAL LOW (ref 12.0–15.0)
Immature Granulocytes: 0 %
Lymphocytes Relative: 21 %
Lymphs Abs: 1.9 K/uL (ref 0.7–4.0)
MCH: 29.7 pg (ref 26.0–34.0)
MCHC: 31.6 g/dL (ref 30.0–36.0)
MCV: 94 fL (ref 80.0–100.0)
Monocytes Absolute: 0.6 K/uL (ref 0.1–1.0)
Monocytes Relative: 7 %
Neutro Abs: 6 K/uL (ref 1.7–7.7)
Neutrophils Relative %: 67 %
Platelets: 222 K/uL (ref 150–400)
RBC: 4.01 MIL/uL (ref 3.87–5.11)
RDW: 13.8 % (ref 11.5–15.5)
WBC: 8.9 K/uL (ref 4.0–10.5)
nRBC: 0 % (ref 0.0–0.2)

## 2024-03-24 LAB — I-STAT VENOUS BLOOD GAS, ED
Acid-base deficit: 2 mmol/L (ref 0.0–2.0)
Bicarbonate: 20.7 mmol/L (ref 20.0–28.0)
Calcium, Ion: 1.03 mmol/L — ABNORMAL LOW (ref 1.15–1.40)
HCT: 36 % (ref 36.0–46.0)
Hemoglobin: 12.2 g/dL (ref 12.0–15.0)
O2 Saturation: 99 %
Potassium: 4.1 mmol/L (ref 3.5–5.1)
Sodium: 134 mmol/L — ABNORMAL LOW (ref 135–145)
TCO2: 22 mmol/L (ref 22–32)
pCO2, Ven: 29.1 mmHg — ABNORMAL LOW (ref 44–60)
pH, Ven: 7.46 — ABNORMAL HIGH (ref 7.25–7.43)
pO2, Ven: 134 mmHg — ABNORMAL HIGH (ref 32–45)

## 2024-03-24 LAB — COMPREHENSIVE METABOLIC PANEL WITH GFR
ALT: <5 U/L (ref 0–44)
AST: 19 U/L (ref 15–41)
Albumin: 3.3 g/dL — ABNORMAL LOW (ref 3.5–5.0)
Alkaline Phosphatase: 82 U/L (ref 38–126)
Anion gap: 9 (ref 5–15)
BUN: 10 mg/dL (ref 8–23)
CO2: 21 mmol/L — ABNORMAL LOW (ref 22–32)
Calcium: 9 mg/dL (ref 8.9–10.3)
Chloride: 103 mmol/L (ref 98–111)
Creatinine, Ser: 1.41 mg/dL — ABNORMAL HIGH (ref 0.44–1.00)
GFR, Estimated: 39 mL/min — ABNORMAL LOW (ref >60)
Glucose, Bld: 171 mg/dL — ABNORMAL HIGH (ref 70–99)
Potassium: 4 mmol/L (ref 3.5–5.1)
Sodium: 133 mmol/L — ABNORMAL LOW (ref 135–145)
Total Bilirubin: 0.5 mg/dL (ref 0.0–1.2)
Total Protein: 6.7 g/dL (ref 6.5–8.1)

## 2024-03-24 LAB — TROPONIN I (HIGH SENSITIVITY)
Troponin I (High Sensitivity): 13 ng/L (ref <18)
Troponin I (High Sensitivity): 39 ng/L — ABNORMAL HIGH (ref <18)

## 2024-03-24 MED ORDER — FUROSEMIDE 10 MG/ML IJ SOLN
20.0000 mg | Freq: Once | INTRAMUSCULAR | Status: AC
  Administered 2024-03-24: 20 mg via INTRAVENOUS
  Filled 2024-03-24: qty 2

## 2024-03-24 NOTE — Progress Notes (Signed)
   03/24/24 2019  BiPAP/CPAP/SIPAP  $ Non-Invasive Ventilator  Non-Invasive Vent Initial;Non-Invasive Vent Set Up  $ Face Mask Small Yes  BiPAP/CPAP/SIPAP Pt Type Adult  BiPAP/CPAP/SIPAP SERVO  Mask Type Full face mask  Dentures removed? Not applicable  Mask Size Small  Respiratory Rate 23 breaths/min  IPAP 10 cmH20  EPAP 5 cmH2O  Pressure Support 5 cmH20  PEEP 5 cmH20  FiO2 (%) 40 %  Flow Rate 0 lpm  Minute Ventilation 11.4  Leak 41  Peak Inspiratory Pressure (PIP) 11  Tidal Volume (Vt) 548  Patient Home Machine No  Patient Home Mask No  Patient Home Tubing No  Auto Titrate No  Press High Alarm 20 cmH2O  Device Plugged into RED Power Outlet Yes  BiPAP/CPAP /SiPAP Vitals  Bilateral Breath Sounds  (Coarse)

## 2024-03-24 NOTE — ED Triage Notes (Signed)
 Pt here from home with c/o sob that started couple hours ago along with a headache . Pt isa hospice pt , pt was placed on 6 liters n/c by ems received 4 mg of zofran  ,

## 2024-03-24 NOTE — H&P (Shared)
 Hospital Admission History and Physical Service Pager: [phone]  Patient name: Veronica Shaw Medical record number: [id number] Date of Birth: 03-Oct-1950 Age: 73 y.o. Gender: female  Primary Care Provider: Howell Lunger, DO Consultants: None Code Status: DNR, DNI Preferred Emergency Contact: Bobetta Cary (granddaughter) [phone]   Chief Complaint: SOB  Assessment and Plan: Veronica Shaw is a 73 y.o. female presenting with acute onsent SOB. Differential for presentation of this includes acute CHF exacerbation vs. MI vs. pulmonary embolism. Given h/o HFrEF and elevated BNP most likely acute heart failure exacerbation however it would be non-classic given acuity and lack of clear exacerbating factor. Troponin was mildly elevated; however, EKG without evidence of STEMI per Sgarbossa criteria (hx of LBBB) and is more suggestive of demand ischemia. Will rule out PE; however, low probability based on Well's score. Low concern for pneumonia given normal CXR and lack of infectious symptoms.  Assessment & Plan Acute heart failure (HCC) Acute dyspnea Chronic respiratory failure (HCC) Improving, placed on bipap in ED for WOB. ECHO 09/2022 EF 45-50%. On 2L [place] home oxygen . - Admit to family medicine teaching service, attending Dr. Anders, progressive - Wean BiPAP - D-dimer - Consider cardiology consult if troponins remain elevated - Redose IV Lasix  20mg  in AM - Strict I/Os - Daily weights - continuous cardiac monitoring - continuous pulse ox Elevated troponin Demand ischemia. EKG with known LBBB, tachycardia, negative Sgarbossa criteria.  - serial troponins - Consider cardiology consult if troponins remain elevated AKI (acute kidney injury) (HCC) CMP with Cr 1.41, baseline ~1.2. Likely cardiorenal 2/2 CHF exacerbation - AM BMP, Mg - Consider UA or renal u/s if no improvement Goals of care, counseling/discussion On home hospice. Primarily comfort care. Requested medical  treatment for acute medical needs. Remains DNR. Chronic health problem Parkinson's - continue home carbidopa -levodopa  T2DM - holding home metformin , SSI, cbg checks with meals and at night Asthma/COPD - continue home albuterol , singulair   FEN/GI: heart healthy/carb modified VTE Prophylaxis: Lovenox   Disposition: Progressive  History of Present Illness:  Veronica Shaw is a 73 y.o. female presenting with SOB. She is here with granddaughter, who provides history while patient is on bipap. Patient has significant pain due to parkinson's which worsened acute on chronic today. She had some shortness of breath and wheezing gradually. 911 was called due to patient having an impending sense of doom which she is still experiencing right now. Also reporting HA but this is not new. Denies sick contacts, or cough or cold symptoms. Patient is on home hospice with goal of comfort.  In the ED, patient showed increased work of breathing and wheezing and was placed on BiPAP with improvement in sx. She was also given IV Lasix  20mg .  Review Of Systems: Per HPI with the following additions: none  Pertinent Past Medical History: Non-ischemic cardiomyopathy COPD Parkinson's HFrEF T2DM HLD CKD HTN Asthma CHF Afib  Remainder reviewed in history tab.   Pertinent Past Surgical History: ICD 10-63yrs ago   Remainder reviewed in history tab.   Pertinent Social History: Tobacco use: No Alcohol use: None Other Substance use: None Lives with Husband  Pertinent Family History: Mom - heart issues, asthma, diabetes, passed away from aneurysm Dad - heart issues  Remainder reviewed in history tab.   Important Outpatient Medications: Albuterol  prn Carbidopa -levodopa  25-100mg  TID Pepcid  20mg  daily Gabapentin  300mg  TID Meclizine  25mg  TID prn Metformin  500mg  daily Singulair  10mg  daily Tylenol  Oxy 10-20mg  q4hrs prn Atuvab 0.25 q4 prn Morphine  concentrate 0.25 q4 prn  Lasix  20mg  every other  day  Remainder reviewed in medication history.   Objective: BP 95/81   Pulse 99   Temp 98.3 F (36.8 C) (Oral)   Resp (!) 21   LMP 08/01/2000 (Approximate)   SpO2 100%   Exam: General: Lying in bed with BiPAP Neck: Supple, normal ROM, no LAD, no thyromegaly, no focal tenderness Cardiovascular: RRR, S1/S2 normal. No murmurs, rubs, or gallops appreciated. Pedal/radial pulse +2 bilaterally Pulmonary: Normal work of breathing on Bipap. CTAB with no wheezes or crackles present Abdomen: Soft, non-tender, non-distended Extremities: Warm and well-perfused. No pedal edema Neurologic: No focal deficits Skin: No rashes or lesions. Psych: Appropriate mood and affect  Labs:  CBC BMET  Recent Labs  Lab 03/24/24 1953 03/24/24 2021  WBC 8.9  --   HGB 11.9* 12.2  HCT 37.7 36.0  PLT 222  --    Recent Labs  Lab 03/24/24 1953 03/24/24 2021  NA 133* 134*  K 4.0 4.1  CL 103  --   CO2 21*  --   BUN 10  --   CREATININE 1.41*  --   GLUCOSE 171*  --   CALCIUM  9.0  --      Pertinent additional labs: Troponin 13 > 39 BNP 1206.7   EKG: Sinus tachy, prolonged QT, and pre-existing LBBB. Repeat with sinus rhythm and no other acute changes.   Imaging Studies Performed:  CXR: Chronic bronchitic changes in the lungs. No focal consolidation    Jerrie Gathers, DO 03/24/2024, 11:17 PM PGY-1, [facility]  FPTS Intern pager: [phone], text pages welcome Secure chat group [facility] Teaching Service   I have verified that the resident's  findings are accurately documented in the resident's note. I have made edits and changes where appropriate, and agree with plan.  Additionally,  Discussed troponin elevation with on-call Cardiology Dr. Charlott. Likely demand ischemia in the setting of acute heart failure and AKI. Unlikely NSTEMI without active chest pain and now flat troponins. Recommended no medical treatment for NSTEMI. Urged to reach out to  Cardiology day team if further evaluation required.  Ozell Provencal, MD, PGY-3 [facility] 1:43 AM 03/25/2024

## 2024-03-24 NOTE — ED Notes (Signed)
 Called [facility] to assist with Bipap setup, they were receptive

## 2024-03-24 NOTE — ED Provider Notes (Signed)
 [place] EMERGENCY DEPARTMENT AT [facility] Provider Note   CSN: [id number] Arrival date & time: 03/24/24  1945     Patient presents with: Shortness of Breath   Veronica Shaw is a 73 y.o. female.   Pt is a 73y/o female with history of T2DM, HTN, Parkinson's, HFmrEF (40-45%), ICD currently receiving hospice care at home and who has not tolerated diuretics well in the past due to her Parkinson's disease and autonomic dysfunction.  Patient does have chronic Foley catheter as well.  Patient reports that today she had a headache which she has a headache every day but it felt more severe today and then 2 hours prior to arrival she developed severe shortness of breath.  Patient reports that she had gotten something for her headache at home and does not want anything for that but she continues to feel very short of breath.  She denies any chest pain.  She has not had a productive cough or fever at home.  She does have nebulizers at home but her family member reports they did not give her them because her heart rate was so elevated.  She has not had any noted swelling to her legs but did feel some mild distention in her abdomen.  She has had no recent medication changes.  She has had no nausea vomiting or diarrhea.  Her appetite has been the same.  The history is provided by the patient, medical records and the EMS personnel.  Shortness of Breath      Prior to Admission medications   Medication Sig Start Date End Date Taking? Authorizing Provider  acetaminophen  (TYLENOL ) 500 MG tablet Take 2 tablets (1,000 mg total) by mouth every 8 (eight) hours as needed. 01/29/21   Brimage, Vondra, DO  albuterol  (PROVENTIL ) (2.5 MG/3ML) 0.083% nebulizer solution Inhale into the lungs. 01/11/13   [provider]  albuterol  (VENTOLIN  HFA) 108 (90 Base) MCG/ACT inhaler Inhale into the lungs. 01/04/17   [provider]  carbidopa -levodopa  (SINEMET  IR) 25-100 MG tablet TAKE 2 TABLETS AT  7 AM, 2 TABLETS AT 11AM, AND 1 TABLET AT 4 PM Patient taking differently: 1 tablet 3 (three) times daily. TAKE 1 TABLET AT 7 AM 1 TABLETS AT 11AM, AND 1 TABLET AT 4 PM 01/30/23   Tat, Asberry RAMAN, DO  diclofenac  Sodium (VOLTAREN ) 1 % GEL APPLY 4 GRAMS TOPICALLY FOUR TIMES DAILY 04/28/22   Christia Budds, MD  famotidine  (PEPCID ) 20 MG tablet TAKE 1 TABLET TWICE DAILY Patient taking differently: Take 20 mg by mouth at bedtime. 06/17/19   Erle Hails, DO  gabapentin  (NEURONTIN ) 300 MG capsule Take 300 mg by mouth 3 (three) times daily. 12/04/22   [provider]  meclizine  (ANTIVERT ) 25 MG tablet Take 1 tablet (25 mg total) by mouth 3 (three) times daily as needed for dizziness. 03/19/22   Charlyn Sora, MD  melatonin 5 MG TABS Take 5 mg by mouth at bedtime.    [provider]  metFORMIN  (GLUCOPHAGE -XR) 500 MG 24 hr tablet Take 500 mg by mouth daily. 11/15/22   [provider]  montelukast  (SINGULAIR ) 10 MG tablet Take 1 tablet (10 mg total) by mouth at bedtime. 06/01/21   Mannam, Praveen, MD  OXYGEN  Inhale into the lungs at bedtime. Inhale 2L into lungs    [provider]  polyethylene glycol (MIRALAX  / GLYCOLAX ) 17 g packet Take 17 g by mouth daily. 09/23/22   Christia Budds, MD  sulfamethoxazole -trimethoprim  (BACTRIM  DS) 800-160 MG tablet  Take 1 tablet by mouth daily. 11/18/22   [provider]    Allergies: Aspirin , Mirtazapine , Penicillins, Prempro [conj estrog-medroxyprogest ace], Ace inhibitors, and Simvastatin    Review of Systems  Respiratory:  Positive for shortness of breath.     Updated Vital Signs BP 95/81   Pulse 99   Temp 98.3 F (36.8 C) (Oral)   Resp (!) 21   LMP 08/01/2000 (Approximate)   SpO2 100%   Physical Exam Vitals and nursing note reviewed.  Constitutional:      General: She is in acute distress.     Appearance: She is well-developed.  HENT:     Head: Normocephalic and atraumatic.  Eyes:     Pupils: Pupils are  equal, round, and reactive to light.  Cardiovascular:     Rate and Rhythm: Regular rhythm. Tachycardia present.     Pulses: Normal pulses.     Heart sounds: Normal heart sounds. No murmur heard.    No friction rub.  Pulmonary:     Effort: Pulmonary effort is normal. Tachypnea present.     Breath sounds: Wheezing present. No rales.  Abdominal:     General: Bowel sounds are normal. There is no distension.     Palpations: Abdomen is soft.     Tenderness: There is no abdominal tenderness. There is no guarding or rebound.  Musculoskeletal:        General: No tenderness. Normal range of motion.     Right lower leg: No edema.     Left lower leg: No edema.     Comments: No edema  Skin:    General: Skin is warm and dry.     Findings: No rash.  Neurological:     Mental Status: She is alert and oriented to person, place, and time. Mental status is at baseline.     Cranial Nerves: No cranial nerve deficit.  Psychiatric:        Mood and Affect: Mood normal.        Behavior: Behavior normal.     (all labs ordered are listed, but only abnormal results are displayed) Labs Reviewed  CBC WITH DIFFERENTIAL/PLATELET - Abnormal; Notable for the following components:      Result Value   Hemoglobin 11.9 (*)    All other components within normal limits  BRAIN NATRIURETIC PEPTIDE - Abnormal; Notable for the following components:   B Natriuretic Peptide 1,206.7 (*)    All other components within normal limits  COMPREHENSIVE METABOLIC PANEL WITH GFR - Abnormal; Notable for the following components:   Sodium 133 (*)    CO2 21 (*)    Glucose, Bld 171 (*)    Creatinine, Ser 1.41 (*)    Albumin 3.3 (*)    GFR, Estimated 39 (*)    All other components within normal limits  I-STAT VENOUS BLOOD GAS, ED - Abnormal; Notable for the following components:   pH, Ven 7.460 (*)    pCO2, Ven 29.1 (*)    pO2, Ven 134 (*)    Sodium 134 (*)    Calcium , Ion 1.03 (*)    All other components within normal  limits  TROPONIN I (HIGH SENSITIVITY) - Abnormal; Notable for the following components:   Troponin I (High Sensitivity) 39 (*)    All other components within normal limits  TROPONIN I (HIGH SENSITIVITY)    EKG: EKG Interpretation Date/Time:  Sunday March 24 2024 19:53:22 EDT Ventricular Rate:  129 PR Interval:  132 QRS Duration:  140  QT Interval:  357 QTC Calculation: 523 R Axis:   95  Text Interpretation: Sinus tachycardia Prolonged QT interval Intraventricular conduction delay No significant change since last tracing Confirmed by Doretha Folks ([id number]) on 03/24/2024 9:05:40 PM  Radiology: [facility] Chest Port 1 View Result Date: 03/24/2024 CLINICAL DATA:  Shortness of breath EXAM: PORTABLE CHEST 1 VIEW COMPARISON:  08/15/2022 FINDINGS: Cardiac pacemaker. Heart size and pulmonary vascularity are normal. Central interstitial changes with peribronchial thickening and streaky perihilar opacities. This likely represents chronic bronchitis. No airspace disease or consolidation. No pleural effusion or pneumothorax. Mediastinal contours appear intact. Degenerative changes in the spine and shoulders. IMPRESSION: Chronic bronchitic changes in the lungs.  No focal consolidation. Electronically Signed   By: Elsie Gravely M.D.   On: 03/24/2024 21:08     Procedures   Medications Ordered in the ED  furosemide  (LASIX ) injection 20 mg (20 mg Intravenous Given 03/24/24 2221)                                    Medical Decision Making Amount and/or Complexity of Data Reviewed Independent Historian: EMS External Data Reviewed: notes. Labs: ordered. Decision-making details documented in ED Course. Radiology: ordered and independent interpretation performed. Decision-making details documented in ED Course. ECG/medicine tests: ordered and independent interpretation performed. Decision-making details documented in ED Course.  Risk Prescription drug management. Decision regarding  hospitalization.   Pt with multiple medical problems and comorbidities and presenting today with a complaint that caries a high risk for morbidity and mortality.  Presenting today with sudden onset of shortness of breath and respiratory distress.  Patient based on her history concern for CHF exacerbation, MI, dysrhythmia, respiratory cause, flash pulmonary edema.  Lower suspicion for pneumonia, PE, pneumothorax, anemia.  Because of patient's work of breathing, wheezing, heart history she was started on BiPAP due to her work of breathing.  Once BiPAP was started her work of breathing significantly improved and heart rate improved from 132 to the upper 90s.  I independently interpreted patient's labs and EKG.  EKG showed sinus tachycardia without acute changes, CBC without acute findings with stable hemoglobin, VBG with slightly elevated pH of 7.46 with normal bicarb no evidence of hypercarbia, BNP is elevated at 1200 and CMP with mild AKI today of 1.41 from her baseline of less than 1 with normal potassium.  Patient's initial troponin  Was 13 and repeat was 39 most likely related to strain from the above.  Due to concern for CHF exacerbation patient was given IV Lasix .  She has put out approximately 250 mL. I have independently visualized and interpreted pt's images today.  Chest x-ray with no acute findings and no focal consolidation.  Radiology reports chronic bronchitic changes in the lungs. Findings discussed with the patient and her family member who is present at bedside.  At this time we will plan on admission for further diuresis and weaning off BiPAP.  Consulted family medicine who is her PCP.  They will come and see and admit the patient.  Patient and her family member are comfortable with this plan. CRITICAL CARE Performed by: Donnel Venuto Total critical care time: 40 minutes Critical care time was exclusive of separately billable procedures and treating other patients. Critical care was  necessary to treat or prevent imminent or life-threatening deterioration. Critical care was time spent personally by me on the following activities: development of treatment plan with patient and/or surrogate as  well as nursing, discussions with consultants, evaluation of patient's response to treatment, examination of patient, obtaining history from patient or surrogate, ordering and performing treatments and interventions, ordering and review of laboratory studies, ordering and review of radiographic studies, pulse oximetry and re-evaluation of patient's condition.        Final diagnoses:  Acute on chronic congestive heart failure, unspecified heart failure type [facility])  Respiratory distress    ED Discharge Orders     None          Doretha Folks, MD 03/24/24 2359

## 2024-03-25 ENCOUNTER — Inpatient Hospital Stay (HOSPITAL_COMMUNITY)

## 2024-03-25 ENCOUNTER — Encounter (HOSPITAL_COMMUNITY): Payer: Self-pay

## 2024-03-25 DIAGNOSIS — I509 Heart failure, unspecified: Secondary | ICD-10-CM

## 2024-03-25 DIAGNOSIS — I5043 Acute on chronic combined systolic (congestive) and diastolic (congestive) heart failure: Secondary | ICD-10-CM | POA: Diagnosis present

## 2024-03-25 DIAGNOSIS — R0603 Acute respiratory distress: Secondary | ICD-10-CM | POA: Diagnosis not present

## 2024-03-25 DIAGNOSIS — R000 Tachycardia, unspecified: Secondary | ICD-10-CM | POA: Diagnosis present

## 2024-03-25 DIAGNOSIS — N179 Acute kidney failure, unspecified: Secondary | ICD-10-CM | POA: Diagnosis present

## 2024-03-25 DIAGNOSIS — Z515 Encounter for palliative care: Secondary | ICD-10-CM | POA: Diagnosis not present

## 2024-03-25 DIAGNOSIS — N1831 Chronic kidney disease, stage 3a: Secondary | ICD-10-CM | POA: Diagnosis present

## 2024-03-25 DIAGNOSIS — E785 Hyperlipidemia, unspecified: Secondary | ICD-10-CM | POA: Diagnosis present

## 2024-03-25 DIAGNOSIS — G4733 Obstructive sleep apnea (adult) (pediatric): Secondary | ICD-10-CM | POA: Diagnosis present

## 2024-03-25 DIAGNOSIS — R0602 Shortness of breath: Secondary | ICD-10-CM | POA: Diagnosis present

## 2024-03-25 DIAGNOSIS — R64 Cachexia: Secondary | ICD-10-CM | POA: Diagnosis present

## 2024-03-25 DIAGNOSIS — E1122 Type 2 diabetes mellitus with diabetic chronic kidney disease: Secondary | ICD-10-CM | POA: Diagnosis present

## 2024-03-25 DIAGNOSIS — E1143 Type 2 diabetes mellitus with diabetic autonomic (poly)neuropathy: Secondary | ICD-10-CM | POA: Diagnosis present

## 2024-03-25 DIAGNOSIS — R54 Age-related physical debility: Secondary | ICD-10-CM | POA: Diagnosis present

## 2024-03-25 DIAGNOSIS — J9610 Chronic respiratory failure, unspecified whether with hypoxia or hypercapnia: Secondary | ICD-10-CM | POA: Insufficient documentation

## 2024-03-25 DIAGNOSIS — I10 Essential (primary) hypertension: Secondary | ICD-10-CM | POA: Diagnosis not present

## 2024-03-25 DIAGNOSIS — G8194 Hemiplegia, unspecified affecting left nondominant side: Secondary | ICD-10-CM | POA: Diagnosis not present

## 2024-03-25 DIAGNOSIS — Z66 Do not resuscitate: Secondary | ICD-10-CM | POA: Diagnosis present

## 2024-03-25 DIAGNOSIS — I4891 Unspecified atrial fibrillation: Secondary | ICD-10-CM | POA: Diagnosis present

## 2024-03-25 DIAGNOSIS — J4489 Other specified chronic obstructive pulmonary disease: Secondary | ICD-10-CM | POA: Diagnosis present

## 2024-03-25 DIAGNOSIS — Z79899 Other long term (current) drug therapy: Secondary | ICD-10-CM | POA: Diagnosis not present

## 2024-03-25 DIAGNOSIS — R519 Headache, unspecified: Secondary | ICD-10-CM | POA: Diagnosis present

## 2024-03-25 DIAGNOSIS — I428 Other cardiomyopathies: Secondary | ICD-10-CM | POA: Diagnosis present

## 2024-03-25 DIAGNOSIS — Z7984 Long term (current) use of oral hypoglycemic drugs: Secondary | ICD-10-CM | POA: Diagnosis not present

## 2024-03-25 DIAGNOSIS — I951 Orthostatic hypotension: Secondary | ICD-10-CM | POA: Diagnosis not present

## 2024-03-25 DIAGNOSIS — I447 Left bundle-branch block, unspecified: Secondary | ICD-10-CM | POA: Diagnosis present

## 2024-03-25 DIAGNOSIS — K5900 Constipation, unspecified: Secondary | ICD-10-CM | POA: Diagnosis present

## 2024-03-25 DIAGNOSIS — R630 Anorexia: Secondary | ICD-10-CM | POA: Diagnosis not present

## 2024-03-25 DIAGNOSIS — R7989 Other specified abnormal findings of blood chemistry: Secondary | ICD-10-CM | POA: Insufficient documentation

## 2024-03-25 DIAGNOSIS — R0600 Dyspnea, unspecified: Secondary | ICD-10-CM | POA: Insufficient documentation

## 2024-03-25 DIAGNOSIS — E119 Type 2 diabetes mellitus without complications: Secondary | ICD-10-CM | POA: Diagnosis not present

## 2024-03-25 DIAGNOSIS — Z789 Other specified health status: Secondary | ICD-10-CM

## 2024-03-25 DIAGNOSIS — I2489 Other forms of acute ischemic heart disease: Secondary | ICD-10-CM | POA: Diagnosis present

## 2024-03-25 DIAGNOSIS — G20A1 Parkinson's disease without dyskinesia, without mention of fluctuations: Secondary | ICD-10-CM | POA: Diagnosis not present

## 2024-03-25 DIAGNOSIS — I130 Hypertensive heart and chronic kidney disease with heart failure and stage 1 through stage 4 chronic kidney disease, or unspecified chronic kidney disease: Secondary | ICD-10-CM | POA: Diagnosis present

## 2024-03-25 LAB — CBG MONITORING, ED
Glucose-Capillary: 116 mg/dL — ABNORMAL HIGH (ref 70–99)
Glucose-Capillary: 120 mg/dL — ABNORMAL HIGH (ref 70–99)

## 2024-03-25 LAB — ECHOCARDIOGRAM COMPLETE
Area-P 1/2: 5.84 cm2
S' Lateral: 5 cm

## 2024-03-25 LAB — GLUCOSE, CAPILLARY: Glucose-Capillary: 132 mg/dL — ABNORMAL HIGH (ref 70–99)

## 2024-03-25 LAB — D-DIMER, QUANTITATIVE
D-Dimer, Quant: 2.56 ug{FEU}/mL — ABNORMAL HIGH (ref 0.00–0.50)
D-Dimer, Quant: 2.23 ug{FEU}/mL — ABNORMAL HIGH (ref 0.00–0.50)

## 2024-03-25 LAB — BASIC METABOLIC PANEL WITH GFR
Anion gap: 5 (ref 5–15)
BUN: 9 mg/dL (ref 8–23)
CO2: 23 mmol/L (ref 22–32)
Calcium: 8 mg/dL — ABNORMAL LOW (ref 8.9–10.3)
Chloride: 109 mmol/L (ref 98–111)
Creatinine, Ser: 1.11 mg/dL — ABNORMAL HIGH (ref 0.44–1.00)
GFR, Estimated: 52 mL/min — ABNORMAL LOW (ref >60)
Glucose, Bld: 98 mg/dL (ref 70–99)
Potassium: 3.5 mmol/L (ref 3.5–5.1)
Sodium: 137 mmol/L (ref 135–145)

## 2024-03-25 LAB — MAGNESIUM: Magnesium: 1.4 mg/dL — ABNORMAL LOW (ref 1.7–2.4)

## 2024-03-25 LAB — MRSA NEXT GEN BY PCR, NASAL: MRSA by PCR Next Gen: NOT DETECTED

## 2024-03-25 LAB — TROPONIN I (HIGH SENSITIVITY)
Troponin I (High Sensitivity): 42 ng/L — ABNORMAL HIGH (ref <18)
Troponin I (High Sensitivity): 45 ng/L — ABNORMAL HIGH (ref <18)

## 2024-03-25 LAB — HEMOGLOBIN A1C
Hgb A1c MFr Bld: 5.6 % (ref 4.8–5.6)
Mean Plasma Glucose: 114.02 mg/dL

## 2024-03-25 LAB — TSH: TSH: 1.782 u[IU]/mL (ref 0.350–4.500)

## 2024-03-25 MED ORDER — POTASSIUM CHLORIDE CRYS ER 20 MEQ PO TBCR
40.0000 meq | EXTENDED_RELEASE_TABLET | Freq: Once | ORAL | Status: DC
  Filled 2024-03-25: qty 2

## 2024-03-25 MED ORDER — CHLORHEXIDINE GLUCONATE CLOTH 2 % EX PADS
6.0000 | MEDICATED_PAD | Freq: Every day | CUTANEOUS | Status: DC
  Administered 2024-03-25 – 2024-03-26 (×2): 6 via TOPICAL

## 2024-03-25 MED ORDER — CARBIDOPA-LEVODOPA 25-100 MG PO TABS: 1.0000 | ORAL_TABLET | Freq: Three times a day (TID) | ORAL | Status: DC

## 2024-03-25 MED ORDER — ENOXAPARIN SODIUM 30 MG/0.3ML IJ SOSY
30.0000 mg | PREFILLED_SYRINGE | INTRAMUSCULAR | Status: DC
  Administered 2024-03-25 – 2024-03-26 (×2): 30 mg via SUBCUTANEOUS
  Filled 2024-03-25 (×2): qty 0.3

## 2024-03-25 MED ORDER — ACETAMINOPHEN 500 MG PO TABS
1000.0000 mg | ORAL_TABLET | Freq: Four times a day (QID) | ORAL | Status: DC | PRN
  Administered 2024-03-25: 1000 mg via ORAL
  Filled 2024-03-25: qty 2

## 2024-03-25 MED ORDER — MONTELUKAST SODIUM 10 MG PO TABS
10.0000 mg | ORAL_TABLET | Freq: Every day | ORAL | Status: DC
Start: 2024-03-25 — End: 2024-03-26
  Administered 2024-03-25: 10 mg via ORAL
  Filled 2024-03-25: qty 1

## 2024-03-25 MED ORDER — IOHEXOL 350 MG/ML SOLN
75.0000 mL | Freq: Once | INTRAVENOUS | Status: AC | PRN
  Administered 2024-03-25: 75 mL via INTRAVENOUS

## 2024-03-25 MED ORDER — FUROSEMIDE 20 MG PO TABS
20.0000 mg | ORAL_TABLET | Freq: Every day | ORAL | Status: DC
  Administered 2024-03-25 – 2024-03-26 (×2): 20 mg via ORAL
  Filled 2024-03-25 (×2): qty 1

## 2024-03-25 MED ORDER — MAGNESIUM SULFATE 2 GM/50ML IV SOLN
2.0000 g | Freq: Once | INTRAVENOUS | Status: AC
  Administered 2024-03-25: 2 g via INTRAVENOUS
  Filled 2024-03-25: qty 50

## 2024-03-25 MED ORDER — FAMOTIDINE 20 MG PO TABS
20.0000 mg | ORAL_TABLET | Freq: Every day | ORAL | Status: DC
  Administered 2024-03-25: 20 mg via ORAL
  Filled 2024-03-25: qty 1

## 2024-03-25 MED ORDER — MECLIZINE HCL 25 MG PO TABS
25.0000 mg | ORAL_TABLET | Freq: Three times a day (TID) | ORAL | Status: DC | PRN
  Filled 2024-03-25: qty 1

## 2024-03-25 MED ORDER — TIZANIDINE HCL 4 MG PO TABS
2.0000 mg | ORAL_TABLET | Freq: Once | ORAL | Status: AC
  Administered 2024-03-25: 2 mg via ORAL
  Filled 2024-03-25: qty 1

## 2024-03-25 MED ORDER — CARBIDOPA-LEVODOPA 25-100 MG PO TABS
2.0000 | ORAL_TABLET | Freq: Every day | ORAL | Status: DC
  Administered 2024-03-25 – 2024-03-26 (×2): 2 via ORAL
  Filled 2024-03-25 (×3): qty 2

## 2024-03-25 MED ORDER — INSULIN ASPART 100 UNIT/ML IJ SOLN
0.0000 [IU] | Freq: Three times a day (TID) | INTRAMUSCULAR | Status: DC
  Administered 2024-03-26: 1 [IU] via SUBCUTANEOUS

## 2024-03-25 MED ORDER — ACETAMINOPHEN 500 MG PO TABS
1000.0000 mg | ORAL_TABLET | Freq: Three times a day (TID) | ORAL | Status: DC | PRN
  Administered 2024-03-25: 1000 mg via ORAL
  Filled 2024-03-25: qty 2

## 2024-03-25 MED ORDER — CARBIDOPA-LEVODOPA 25-100 MG PO TABS
1.0000 | ORAL_TABLET | Freq: Two times a day (BID) | ORAL | Status: DC
  Administered 2024-03-25 – 2024-03-26 (×4): 1 via ORAL
  Filled 2024-03-25 (×4): qty 1

## 2024-03-25 MED ORDER — DICLOFENAC SODIUM 1 % EX GEL: 2.0000 g | CUTANEOUS | Status: DC | PRN

## 2024-03-25 MED ORDER — ALBUTEROL SULFATE (2.5 MG/3ML) 0.083% IN NEBU: 2.5000 mg | INHALATION_SOLUTION | RESPIRATORY_TRACT | Status: DC | PRN

## 2024-03-25 MED ORDER — OXYCODONE HCL 5 MG PO TABS
5.0000 mg | ORAL_TABLET | ORAL | Status: DC | PRN
  Administered 2024-03-25 (×3): 5 mg via ORAL
  Filled 2024-03-25 (×3): qty 1

## 2024-03-25 MED ORDER — MORPHINE SULFATE 15 MG PO TABS: 15.0000 mg | ORAL_TABLET | Freq: Once | ORAL | Status: DC

## 2024-03-25 MED ORDER — POLYETHYLENE GLYCOL 3350 17 G PO PACK
17.0000 g | PACK | Freq: Every day | ORAL | Status: DC
  Administered 2024-03-25 – 2024-03-26 (×2): 17 g via ORAL
  Filled 2024-03-25 (×2): qty 1

## 2024-03-25 MED ORDER — MORPHINE SULFATE (CONCENTRATE) 10 MG /0.5 ML PO SOLN
5.0000 mg | ORAL | Status: DC | PRN
  Administered 2024-03-25 – 2024-03-26 (×3): 5 mg via ORAL
  Filled 2024-03-25 (×3): qty 0.5

## 2024-03-25 MED ORDER — PERFLUTREN LIPID MICROSPHERE
1.0000 mL | INTRAVENOUS | Status: AC | PRN
  Administered 2024-03-25: 4 mL via INTRAVENOUS

## 2024-03-25 MED ORDER — ACETAMINOPHEN 500 MG PO TABS
1000.0000 mg | ORAL_TABLET | Freq: Four times a day (QID) | ORAL | Status: DC | PRN
  Administered 2024-03-26: 1000 mg via ORAL
  Filled 2024-03-25: qty 2

## 2024-03-25 MED ORDER — OXYCODONE HCL 5 MG PO TABS
5.0000 mg | ORAL_TABLET | ORAL | Status: DC | PRN
  Administered 2024-03-26: 5 mg via ORAL
  Filled 2024-03-25: qty 1

## 2024-03-25 NOTE — Assessment & Plan Note (Addendum)
 Significantly improved today and at home oxygen  of 2L now. SpO2 goal is 88-92% with history of COPD. Responded well to IV lasix  20 mg, on home lasix  20 every other day.  - lasix  20 mg po today - echo today - strict I/os - daily weights - telemetry - K >4.0, Mg >2.0, repleted appropriately - AM BMP

## 2024-03-25 NOTE — Assessment & Plan Note (Addendum)
 Improving, placed on bipap in ED for WOB. ECHO 09/2022 EF 45-50%. On 2L [place] home oxygen . - Admit to family medicine teaching service, attending Dr. Anders, progressive - Wean BiPAP - D-dimer - Consider cardiology consult if troponins remain elevated - Redose IV Lasix  20mg  in AM - Strict I/Os - Daily weights - continuous cardiac monitoring - continuous pulse ox

## 2024-03-25 NOTE — ED Notes (Signed)
 Pt transferred to hospital bed for comfort.

## 2024-03-25 NOTE — Progress Notes (Signed)
 Heart Failure Navigator Progress Note  Assessed for Heart & Vascular TOC clinic readiness.  Patient does not meet criteria due to per MD notes patient involved with Hospice Care, No HF TOC. .   Navigator will sign off at this time.   Stephane Haddock, BSN, Scientist, clinical (histocompatibility and immunogenetics) Only

## 2024-03-25 NOTE — Progress Notes (Addendum)
 Daily Progress Note Intern Pager: [phone]  Patient name: Veronica Shaw Medical record number: [id number] Date of birth: 1950/09/21 Age: 73 y.o. Gender: female  Primary Care Provider: Howell Lunger, DO Consultants: cardiology Code Status: DNR, DNI  Pt Overview and Major Events to Date:  03/24/24 - admitted  Assessment and Plan:  This is a 73 year old female who presented with new onset dyspnea requiring BIPAP secondary to CHF exacerbation, now improved and on home O2 after diuresis. Clinically, she has crackles at the bases of her lungs but otherwise appears euvolemic, no wheezing. Her CTA was negative for a PE. It is unclear what instigated this exacerbation as she did not have any lasix  or diet changes at home prior to admission. It is possible that this represents chronic progression of her HFrEF. ECHO pending. I do not think COPD is contributing given her benign exam today and improvement without COPD treatment. Pertinent PMH/PSH includes HFrEF (EF 45-50%), COPD.  Assessment & Plan Acute heart failure (HCC) Acute dyspnea Chronic respiratory failure (HCC) Significantly improved today and at home oxygen  of 2L now. SpO2 goal is 88-92% with history of COPD. Responded well to IV lasix  20 mg, on home lasix  20 every other day.  - lasix  20 mg po today - echo today - strict I/os - daily weights - telemetry - K >4.0, Mg >2.0, repleted appropriately - AM BMP AKI (acute kidney injury) (HCC) Resolved. Cr improved to 1.11 from Cr 1.41 with diuresis, baseline ~1.2. supports cardiorenal syndrome. - AM BMP, to recheck creatinine after CTA Chronic health problem Parkinson's - continue home carbidopa -levodopa  T2DM - holding home metformin , SSI, cbg checks with meals and at night Asthma/COPD - continue home albuterol , singulair  Chronic pain/hospice - oxy 5 mg q4h, tylenol  1000 mg q6h  FEN/GI: heart healthy PPx: lovenox  Dispo:Home pending clinical improvement .   Subjective:  She  reports she is significantly improved today. No chest pain. Only mild SOB. No edema. Complaining of headache.  Objective: Temp:  [97 F (36.1 C)-98.3 F (36.8 C)] 97 F (36.1 C) (08/25 0520) Pulse Rate:  [87-133] 107 (08/25 0630) Resp:  [13-24] 15 (08/25 0630) BP: (95-128)/(65-89) 115/87 (08/25 0630) SpO2:  [96 %-100 %] 98 % (08/25 0630) FiO2 (%):  [40 %] 40 % (08/25 0431)   Intake/Output Summary (Last 24 hours) at 03/25/2024 0721 Last data filed at 03/25/2024 0716 Gross per 24 hour  Intake 50 ml  Output 1000 ml  Net -950 ml   Physical Exam: General: frail female sitting up in bed Cardiovascular: tachycardic, regular rhythm, no m/r/g Respiratory: faint crackles at the bilateral lung bases Abdomen: soft, nontender Extremities: no peripheral edema  Laboratory: Most recent CBC Lab Results  Component Value Date   WBC 8.9 03/24/2024   HGB 12.2 03/24/2024   HCT 36.0 03/24/2024   MCV 94.0 03/24/2024   PLT 222 03/24/2024   Most recent BMP    Latest Ref Rng & Units 03/25/2024    3:29 AM  BMP  Glucose 70 - 99 mg/dL 98   BUN 8 - 23 mg/dL 9   Creatinine 9.55 - 8.99 mg/dL 8.88   Sodium 864 - 854 mmol/L 137   Potassium 3.5 - 5.1 mmol/L 3.5   Chloride 98 - 111 mmol/L 109   CO2 22 - 32 mmol/L 23   Calcium  8.9 - 10.3 mg/dL 8.0    Other pertinent labs Mg 1.4   Trop 45<42<39  Imaging/Diagnostic Tests: CTA PE  Rads impression: 1. No  pulmonary embolism. 2. Small bilateral pleural effusions, right greater than left. 3. Thickening of the peribronchovascular interstitium and interlobular septal thickening bilaterally. Correlate for any clinical signs or symptoms of congestive heart failure. 4. Aortic atherosclerotic calcification.  Alena Morrison, Waller, MD 03/25/2024, 7:20 AM  PGY-1, [facility] Family [facility] FPTS Intern pager: [phone], text pages welcome Secure chat group [facility] Teaching Service

## 2024-03-25 NOTE — Assessment & Plan Note (Addendum)
 Demand ischemia. EKG with known LBBB, tachycardia, negative Sgarbossa criteria.  - serial troponins - Consider cardiology consult if troponins remain elevated

## 2024-03-25 NOTE — Assessment & Plan Note (Signed)
 Improving, placed on bipap in ED for WOB. ECHO 09/2022 EF 45-50%. On 2L [place] home oxygen . - Admit to family medicine teaching service, attending Dr. Anders, progressive - Wean BiPAP - D-dimer - Consider cardiology consult if troponins remain elevated - Redose IV Lasix  20mg  in AM - Strict I/Os - Daily weights - continuous cardiac monitoring - continuous pulse ox

## 2024-03-25 NOTE — Assessment & Plan Note (Signed)
 CMP with Cr 1.41, baseline ~1.2. Likely cardiorenal 2/2 CHF exacerbation - AM BMP, Mg - Consider UA or renal u/s if no improvement

## 2024-03-25 NOTE — Assessment & Plan Note (Addendum)
 Parkinson's - continue home carbidopa -levodopa  T2DM - holding home metformin , SSI, cbg checks with meals and at night Asthma/COPD - continue home albuterol , singulair 

## 2024-03-25 NOTE — Progress Notes (Signed)
 Veronica Shaw ED 10 [facility] Liaison note   Veronica Shaw is a current hospice patient with a terminal diagnosis of Parkinson's disease. Patient reported severe neck pain on the evening of 8.24. Family placed a call to [facility] triage and reported having exhausted all pain medication options. Morphine  and Ativan were subsequently ordered but family elected to activate EMS for evaluation in the ED due to severe pain and dyspnea. Patient was admitted to [facility] on 8.25.2025 for acute on chronic CHF and respiratory distress. Per Dr. Norleen Laurence with [facility], this is a related hospital admission. Patient is a DNR.   Met with patient at bedside in the ED. She had just had her foley catheter replaced and complained of continued neck and head pain. Liaison helped reposition in bed until more comfortable and assisted with applesauce for breakfast. Patient awaiting a bed. Spoke with patient's granddaughter by phone and answered questions regarding hospice approved hospital admission.    Pt is inpatient appropriate due to treatment of CHF exacerbation and IV medications.   VS: 98.8/109/17   111/89    100% on 2 lpm Noel   I/O: none documented/ 1,000   Abnormal Labs:  03/24/24  Sodium: 133 (L) CO2: 21 (L) Glucose: 171 (H) Creatinine: 1.41 (H) Albumin: 3.3 (L) GFR, Estimated: 39 (L) B Natriuretic Peptide: 1,206.7 (H) Hemoglobin: 11.9 (L) 03/24/24  pH, Ven: 7.460 (H) pCO2, Ven: 29.1 (L) pO2, Ven: 134 (H) Sodium: 134 (L) Calcium  Ionized: 1.03 (L) 03/25/24  Creatinine: 1.11 (H) Calcium : 8.0 (L) Magnesium : 1.4 (L) GFR, Estimated: 52 (L) Troponin I (High Sensitivity): 45 (H) D-Dimer, Quant: 2.23 (H)  Diagnostics:  EXAM: PORTABLE CHEST 1 VIEW   COMPARISON:  08/15/2022   FINDINGS: Cardiac pacemaker. Heart size and pulmonary vascularity are normal. Central interstitial changes with peribronchial thickening and streaky perihilar opacities. This likely  represents chronic bronchitis. No airspace disease or consolidation. No pleural effusion or pneumothorax. Mediastinal contours appear intact. Degenerative changes in the spine and shoulders.   IMPRESSION: Chronic bronchitic changes in the lungs.  No focal consolidation.  EXAM: CTA of the Chest with contrast for PE 03/25/2024 07:08:00 AM   TECHNIQUE: CTA of the chest was performed after the administration of intravenous contrast. Multiplanar reformatted images are provided for review. MIP images are provided for review. Automated exposure control, iterative reconstruction, and/or weight based adjustment of the mA/kV was utilized to reduce the radiation dose to as low as reasonably achievable.   COMPARISON: 10/17/2020   CLINICAL HISTORY: Pulmonary embolism (PE) suspected, high prob. sob   FINDINGS:   PULMONARY ARTERIES: Pulmonary arteries are adequately opacified for evaluation. No pulmonary embolism. Main pulmonary artery is normal in caliber.   MEDIASTINUM: Left chest wall ICD with leads in the right atrial appendage and right ventricle. No pericardial effusion. Aberrant right subclavian artery. The central airways are patent. Thickening of the peribronchovascular interstitium noted.   LYMPH NODES: No mediastinal, hilar or axillary lymphadenopathy.   LUNGS AND PLEURA: Small bilateral pleural effusions identified, right greater than left. Interlobular septal thickening is noted bilaterally. Bibasilar atelectasis. No pneumothorax. Calcified granuloma noted in the left lower lobe.   UPPER ABDOMEN: No acute abnormality within the imaged portions of the upper abdomen.   SOFT TISSUES AND BONES: Thoracic degenerative disc disease. No acute or suspicious osseous findings.   IMPRESSION: 1. No pulmonary embolism. 2. Small bilateral pleural effusions, right greater than left. 3. Thickening of the peribronchovascular interstitium and interlobular septal thickening  bilaterally. Correlate for  any clinical signs or symptoms of congestive heart failure. 4. Aortic atherosclerotic calcification.   IV/PRN meds: Oxycodone  5mg  PO x1, acetaminophen  1,000mg  PO x1, magnesium  sulfate 2g IV x1, Lasix  20mg  IV x1   Assessment and Plan (per Alena Morrison, Elio, MD 8.25.25):  This is a 73 year old female who presented with new onset dyspnea requiring BIPAP secondary to CHF exacerbation, now improved and on home O2 after diuresis. Clinically, she has crackles at the bases of her lungs but otherwise appears euvolemic, no wheezing. Her CTA was negative for a PE. It is unclear what instigated this exacerbation as she did not have any lasix  or diet changes at home prior to admission. It is possible that this represents chronic progression of her HFrEF. ECHO pending. I do not think COPD is contributing given her benign exam today and improvement without COPD treatment. Pertinent PMH/PSH includes HFrEF (EF 45-50%), COPD.  Assessment & Plan Acute heart failure (HCC) Acute dyspnea Chronic respiratory failure (HCC) Significantly improved today and at home oxygen  of 2L now. SpO2 goal is 88-92% with history of COPD. Responded well to IV lasix  20 mg, on home lasix  20 every other day.  - lasix  20 mg po today - echo today - strict I/os - daily weights - telemetry - K >4.0, Mg >2.0, repleted appropriately - AM BMP, Mg AKI (acute kidney injury) (HCC) Resolved. Cr improved to 1.11 from Cr 1.41 with diuresis, baseline ~1.2. supports cardiorenal syndrome. - AM BMP, to recheck creatinine after CTA Chronic health problem Parkinson's - continue home carbidopa -levodopa  T2DM - holding home metformin , SSI, cbg checks with meals and at night Asthma/COPD - continue home albuterol , singulair  Chronic pain/hospice - oxy 5 mg q4h, tylenol  1000 mg q6h   Discharge Planning: ongoing, home when medically stable   Family Contact: pt speaks for herself. Spoke with granddaughter by phone    IDT: updated   Goals of Care: Clear, pt is a DNR   Medication List and Transfer Summary uploaded to patient chart.   Should patient need ambulance transport at discharge, please call [facility] as we contract with them for our active hospice patients.   Please call with any hospice questions or concerns.   Thank you, Eleanor Nail, [facility] Liaison [phone]

## 2024-03-25 NOTE — Assessment & Plan Note (Signed)
 On home hospice. Primarily comfort care. Requested medical treatment for acute medical needs. Remains DNR.

## 2024-03-25 NOTE — Plan of Care (Signed)
 I spoke with the patient and her granddaughter, who is an Charity fundraiser involved heavily in her care, at bedside. The patient was comfortable on room air during our conversation. She continues to report a headache and neck pain. This is a chronic problem for 2 years with no changes. I will increase her tylenol  1000 mg to q6h PRN. Will also add a one time tizanidine  2 mg to see if this helps. She also has PRN oxy ordered, which she is receiving. Otherwise, the patient and her granddaughter did not have any questions. Plan for echo today. Will await results.   Dr. [facility]

## 2024-03-25 NOTE — Progress Notes (Signed)
 PT Cancellation Note  Patient Details Name: Veronica Shaw MRN: [id number] DOB: August 24, 1950   Cancelled Treatment:    Reason Eval/Treat Not Completed: Patient declined, no reason specified. Pt declines PT evaluation at this time. Pt reports she has had chronic neck pain for years since neck fracture and is in too much pain to participate with therapies. Pt also reports she feels that she is past the point of being able to make meaningful recovery with therapy services and declines PT evaluation during this admission. Pt requests PT sign off at this time.   Bernardino JINNY Ruth 03/25/2024, 8:25 AM

## 2024-03-25 NOTE — Progress Notes (Signed)
 Patient c/o 9/10 HA.  Oxycodone  5 mg/Tylenol  given at 2100.  Ineffective.  Patients states she takes more medications at home for HA.  Medication review shows Advil, Morphine  conentrate.  FPTS called.

## 2024-03-25 NOTE — Plan of Care (Signed)
  Problem: Coping: Goal: Ability to adjust to condition or change in health will improve Outcome: Progressing   Problem: Fluid Volume: Goal: Ability to maintain a balanced intake and output will improve Outcome: Progressing   Problem: Health Behavior/Discharge Planning: Goal: Ability to identify and utilize available resources and services will improve Outcome: Progressing   Problem: Metabolic: Goal: Ability to maintain appropriate glucose levels will improve Outcome: Progressing   Problem: Nutritional: Goal: Maintenance of adequate nutrition will improve Outcome: Progressing   Problem: Tissue Perfusion: Goal: Adequacy of tissue perfusion will improve Outcome: Progressing   Problem: Education: Goal: Knowledge of General Education information will improve Description: Including pain rating scale, medication(s)/side effects and non-pharmacologic comfort measures Outcome: Progressing   Problem: Nutrition: Goal: Adequate nutrition will be maintained Outcome: Progressing   Problem: Elimination: Goal: Will not experience complications related to bowel motility Outcome: Progressing Goal: Will not experience complications related to urinary retention Outcome: Progressing   Problem: Pain Managment: Goal: General experience of comfort will improve and/or be controlled Outcome: Progressing   Problem: Safety: Goal: Ability to remain free from injury will improve Outcome: Progressing   Problem: Skin Integrity: Goal: Risk for impaired skin integrity will decrease Outcome: Progressing

## 2024-03-25 NOTE — Hospital Course (Addendum)
 This is a 73 year old female, with PMH of CKD, T2DM, HTN, Parkinson, on home hospice, admitted with acute dyspnea secondary to CHF exacerbation.   Dyspnea  CHF Exacerbation Patient requiring BIPAP on admission for WOB. BNP 1206. D-dimer slightly elevated. CTA PE negative for PE. IV lasix  20 mg administered and patient weaned to home 2L [place]. ECHO revealed EF of 20-25% with grade II diastolic dysfunction. Unclear cause of exacerbation, potentially chronic worsening of HF.  Elevated Troponin Troponin 13>39, then trended flat. Cardiology consulted, suspect secondary to demand ischemia.   AKI Creatinine elevated to 1.4 on admission. Baseline ~1.2. Creatinine improved to 1.1 after diuresis. Suspect secondary to cardiorenal syndrome.   PCP recommendations:  Follow up with cardiologist, may need to consider adjusting Lasix  regimen Evaluate etiology/pain regimen for chronic headache, neck pain Continued goals of care discussions

## 2024-03-25 NOTE — Progress Notes (Signed)
 OT Cancellation Note  Patient Details Name: Veronica Shaw MRN: [id number] DOB: [birth year]   Cancelled Treatment:    Reason Eval/Treat Not Completed: OT screened, no needs identified, will sign off. Pt alert and pleasant on OT arrival, reporting she has assistance from bed level daily as needed and that her grand daughter comes 3 days/week to help her OOB. Pt active with home hospice and does not wish to pursue any acute therapies at this time. Educated regarding role of acute therapies in maintenance of current functional level as well as prevention of further deconditioning and pt verbalizing understanding with awareness if she feels like she needs consult in the future, she can ask for one.   Elma JONETTA Lebron FREDERICK, OTR/L [facility] Acute Rehabilitation Office: [phone]   Elma JONETTA Lebron 03/25/2024, 8:23 AM

## 2024-03-25 NOTE — Assessment & Plan Note (Addendum)
 Resolved. Cr improved to 1.11 from Cr 1.41 with diuresis, baseline ~1.2. supports cardiorenal syndrome. - AM BMP, to recheck creatinine after CTA

## 2024-03-25 NOTE — Progress Notes (Signed)
  Echocardiogram 2D Echocardiogram has been performed.  Koleen KANDICE Popper, RDCS 03/25/2024, 3:20 PM

## 2024-03-25 NOTE — ED Notes (Signed)
 Nurse will collect labs.  KM

## 2024-03-25 NOTE — Assessment & Plan Note (Addendum)
 Parkinson's - continue home carbidopa -levodopa  T2DM - holding home metformin , SSI, cbg checks with meals and at night Asthma/COPD - continue home albuterol , singulair  Chronic pain/hospice - oxy 5 mg q4h, tylenol  1000 mg q6h

## 2024-03-26 ENCOUNTER — Other Ambulatory Visit: Payer: Self-pay

## 2024-03-26 DIAGNOSIS — I509 Heart failure, unspecified: Secondary | ICD-10-CM | POA: Diagnosis not present

## 2024-03-26 DIAGNOSIS — K5900 Constipation, unspecified: Secondary | ICD-10-CM | POA: Insufficient documentation

## 2024-03-26 LAB — BASIC METABOLIC PANEL WITH GFR
Anion gap: 8 (ref 5–15)
BUN: 13 mg/dL (ref 8–23)
CO2: 26 mmol/L (ref 22–32)
Calcium: 9.3 mg/dL (ref 8.9–10.3)
Chloride: 96 mmol/L — ABNORMAL LOW (ref 98–111)
Creatinine, Ser: 1.18 mg/dL — ABNORMAL HIGH (ref 0.44–1.00)
GFR, Estimated: 48 mL/min — ABNORMAL LOW (ref >60)
Glucose, Bld: 114 mg/dL — ABNORMAL HIGH (ref 70–99)
Potassium: 4.5 mmol/L (ref 3.5–5.1)
Sodium: 130 mmol/L — ABNORMAL LOW (ref 135–145)

## 2024-03-26 LAB — MAGNESIUM: Magnesium: 1.8 mg/dL (ref 1.7–2.4)

## 2024-03-26 LAB — GLUCOSE, CAPILLARY
Glucose-Capillary: 134 mg/dL — ABNORMAL HIGH (ref 70–99)
Glucose-Capillary: 121 mg/dL — ABNORMAL HIGH (ref 70–99)
Glucose-Capillary: 102 mg/dL — ABNORMAL HIGH (ref 70–99)

## 2024-03-26 MED ORDER — SENNA 8.6 MG PO TABS
1.0000 | ORAL_TABLET | Freq: Every day | ORAL | Status: DC
  Administered 2024-03-26: 8.6 mg via ORAL
  Filled 2024-03-26: qty 1

## 2024-03-26 MED ORDER — MAGNESIUM SULFATE 2 GM/50ML IV SOLN
2.0000 g | Freq: Once | INTRAVENOUS | Status: AC
  Administered 2024-03-26: 2 g via INTRAVENOUS
  Filled 2024-03-26: qty 50

## 2024-03-26 MED ORDER — ACETAMINOPHEN 500 MG PO TABS
1000.0000 mg | ORAL_TABLET | Freq: Four times a day (QID) | ORAL | Status: DC
  Administered 2024-03-26: 1000 mg via ORAL
  Filled 2024-03-26: qty 2

## 2024-03-26 NOTE — Assessment & Plan Note (Addendum)
 Significantly improved today and on 2L  (home dose). SpO2 goal is 88-92% with history of COPD. Responded well to IV lasix  20 mg, on home lasix  20 every other day.  - lasix  20 mg po tomorrow - echo: EF of 20 to 25% previous EF of 45-50% on 09/2022 - strict I/os - daily weights - telemetry - K >4.0, Mg >2.0, repleted appropriately - AM BMP

## 2024-03-26 NOTE — TOC Initial Note (Addendum)
 Transition of Care [facility]) - Initial/Assessment Note    Patient Details  Name: Veronica Shaw MRN: [id number] Date of Birth: 02/27/1951  [facility]) CM/SW Contact:    Lauraine FORBES Saa, LCSWA Phone Number: 03/26/2024, 2:42 PM  Clinical Narrative:                  2:42 PM Per chart review, patient resides at home with relatives. Patient has a PCP and insurance. Patient does not have SNF history. Patient is currently active with [facility] for HH/DME/PCP needs. Patient has other HH history with [facility]. Patient has other DME (total DME: transport chair, hospital bed, overturn table, BSC, oxygen , nebulizer machine) history with [facility]. Patient's preferred pharmacy's are [facility] [place], [facility] [address], and [facility]. Per MD Resident, patient is expected to discharge tomorrow. Medical team and [facility] Liaison made aware.TOC will continue to follow and be available to assist.  Expected Discharge Plan: Home w Hospice Care Barriers to Discharge: Continued Medical Work up   Patient Goals and CMS Choice            Expected Discharge Plan and Services   Discharge Planning Services: CM Consult Post Acute Care Choice: Hospice Living arrangements for the past 2 months: Single Family Home                                      Prior Living Arrangements/Services Living arrangements for the past 2 months: Single Family Home Lives with:: Relatives Patient language and need for interpreter reviewed:: Yes        Need for Family Participation in Patient Care: No (Comment) Care giver support system in place?: Yes (comment) Current home services: DME, Hospice Criminal Activity/Legal Involvement Pertinent to Current Situation/Hospitalization: No - Comment as needed  Activities of Daily Living   ADL Screening (condition at time of admission) Independently performs ADLs?: Yes (appropriate  for developmental age) Is the patient deaf or have difficulty hearing?: No Does the patient have difficulty seeing, even when wearing glasses/contacts?: No Does the patient have difficulty concentrating, remembering, or making decisions?: No  Permission Sought/Granted Permission sought to share information with : Family Supports, Oceanographer granted to share information with : No (Contact information on chart)  Share Information with NAME: Bobetta Cary  Permission granted to share info w AGENCY: [facility]  Permission granted to share info w Relationship: Granddaughter  Permission granted to share info w Contact Information: [phone]  Emotional Assessment       Orientation: : Oriented to Self, Oriented to Place, Oriented to  Time, Oriented to Situation Alcohol / Substance Use: Not Applicable Psych Involvement: No (comment)  Admission diagnosis:  Acute heart failure (HCC) [I50.9] Respiratory distress [R06.03] Acute on chronic congestive heart failure, unspecified heart failure type (HCC) [I50.9] Patient Active Problem List   Diagnosis Date Noted   Constipation 03/26/2024   Acute on chronic congestive heart failure (HCC) 03/25/2024   AKI (acute kidney injury) (HCC) 03/25/2024   Acute dyspnea 03/25/2024   Elevated troponin 03/25/2024   Chronic health problem 03/25/2024   Chronic respiratory failure (HCC) 03/25/2024   Respiratory distress 03/25/2024   Hypotension 09/22/2022   QT prolongation 09/21/2022   Nocturnal hypoxia 09/21/2022   Carpal tunnel syndrome on both sides 03/30/2022   Type 2 diabetes mellitus with stage 3a chronic kidney disease, with long-term current  use of insulin  (HCC) 09/06/2021   Dyslipidemia 09/06/2021   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) 08/19/2021   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   Mood changes 07/27/2021   Weight loss 05/07/2021   Chronic systolic heart failure (HCC)    Cervical  dystonia 10/07/2020   Goals of care, counseling/discussion 08/21/2020   Dementia without behavioral disturbance (HCC) 08/21/2020   Vision impairment, Right Eye 08/21/2020   Chronic kidney disease, stage 3a (HCC) 08/18/2020   Chronic rhinitis 12/18/2019   Chronic respiratory failure with hypoxia (HCC) 12/18/2019   Hyperlipidemia associated with type 2 diabetes mellitus (HCC) 03/05/2019   Cervical radiculopathy 10/24/2018   Syncope and collapse    Falls frequently    Osteopenia 07/10/2015   Mild persistent asthma in adult without complication 06/18/2015   NICM (nonischemic cardiomyopathy) (HCC) 04/23/2014   Arthritis or polyarthritis, 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   ICD (implantable cardioverter-defibrillator), dual, 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/06/2014   Parkinson disease (HCC) 05/21/2013   Vertigo 03/03/2013   Gastroesophageal reflux disease without esophagitis 09/27/2012   Trigger point with neck pain 04/12/2012   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   Diabetic peripheral neuropathy (HCC) 12/09/2011   Chronic urticaria 05/27/2011   Sinus tachycardia (HCC) 05/27/2011   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 05/20/2011   Type II diabetes mellitus with complication (HCC) 09/28/2006   Hypertension associated with diabetes (HCC) 09/28/2006   PCP:  Howell Lunger, DO Pharmacy:   [id number] [place]  - [address] [place] ([place]) [place] [address] Phone: [phone] Fax: [phone]  [facility] - [place], [place] - [address] [place] [address] Phone: [phone] Fax: [phone]  [facility] [address] [place] [address] Phone: [phone] Fax: [phone]     Social Drivers of Health (SDOH) Social History: SDOH Screenings   Food Insecurity: No Food Insecurity (03/26/2024)  Housing: Patient Unable To Answer (03/26/2024)  Transportation Needs: No  Transportation Needs (03/26/2024)  Utilities: Not At Risk (03/26/2024)  Alcohol Screen: Low Risk  (07/01/2020)  Depression (PHQ2-9): High Risk (08/29/2022)  Financial Resource Strain: Low Risk  (07/01/2020)  Physical Activity: Sufficiently Active (07/01/2020)  Social Connections: Moderately Integrated (03/26/2024)  Stress: No Stress Concern Present (07/01/2020)  Tobacco Use: Low Risk  (03/25/2024)   SDOH Interventions:     Readmission Risk Interventions     No data to display

## 2024-03-26 NOTE — Assessment & Plan Note (Addendum)
 Parkinson's - continue home carbidopa -levodopa  T2DM - holding home metformin , d/c SSI & cbg checks with meals and at night Asthma/COPD - continue home albuterol , singulair  Chronic pain/hospice - oxy 5 mg q4h prn, tylenol  1000 mg q6h scheduled

## 2024-03-26 NOTE — Assessment & Plan Note (Addendum)
 Reports not having BM in 4 to 5 days - MiraLAX  17 g daily - senna daily - consider enema if constipation does not improve

## 2024-03-26 NOTE — TOC CM/SW Note (Signed)
 Transition of Care [facility]) - Inpatient Brief Assessment   Patient Details  Name: Veronica Shaw MRN: [id number] Date of Birth: [birth year]  Transition of [facility]) CM/SW Contact:    Lauraine FORBES Saa, LCSWA Phone Number: 03/26/2024, 2:35 PM   Clinical Narrative:  2:35 PM Per chart review, patient resides at home with relatives. Patient has a PCP and insurance. Patient does not have SNF history. Patient is currently active with [facility] for HH/DME/PCP needs. Patient has other HH history with [facility]. Patient has other DME (total DME: transport chair, hospital bed, overturn table, BSC, oxygen , nebulizer machine) history with [facility]. Patient's preferred pharmacy's are [facility] [place], [facility] [address], and [facility]. Per MD Resident, patient is expected to discharge home tomorrow. Medical Team and [facility] Liaison made aware. TOC will continue to follow and be available to assist.  Transition of Care Asessment: Insurance and Status: Insurance coverage has been reviewed Patient has primary care physician: Yes Home environment has been reviewed: Private Residence Prior level of function:: N/A Prior/Current Home Services: Current home services Social Drivers of Health Review: SDOH reviewed no interventions necessary Readmission risk has been reviewed: Yes (Currently Green 10%) Transition of care needs: transition of care needs identified, TOC will continue to follow

## 2024-03-26 NOTE — Progress Notes (Addendum)
 Patient provided with verbal discharge instructions. Paper copy of discharge provided to patient. RN answered all questions. VSS at discharge. IV removed. Patient belongings sent with patient. Patient to dc'd via [facility] awaiting arrival

## 2024-03-26 NOTE — Progress Notes (Signed)
 [facility] Liaison note Keaunna Lavelle is a current hospice patient with a terminal diagnosis of Parkinson's disease. Patient reported severe neck pain on the evening of 8.24. Family placed a call to [facility] triage and reported having exhausted all pain medication options. Morphine  and Ativan were subsequently ordered but family elected to activate EMS for evaluation in the ED due to severe pain and dyspnea. Patient was admitted to [facility] on 8.25.2025 for acute on chronic CHF and respiratory distress. Per Dr. Norleen Laurence with [facility], this is a related hospital admission. Patient is a DNR.  Met with patient this morning.  Patient was alert and resting quietly this morning.  She reports having pain overnight and was given morphine  for relief. Received IV Magnesium  this morning.  No family at bedside and spoke with granddaughter by phone.  Patient remains inpatient appropriate for testing and IV medications.  Vital Signs: 98.7, 92, 18, 111/65, 92% on 2L [place] I&O: 410//1900 Abnormal Labs: Sodium 130, Chloride 96, Creatinine 1.18, GFR 48  Diagnostics:   Echo Radiologist impression:  1. Left ventricular ejection fraction, by estimation, is 20 to 25%. The  left ventricle has severely decreased function. The left ventricle has no  regional wall motion abnormalities. Left ventricular diastolic parameters  are consistent with Grade II  diastolic dysfunction (pseudonormalization). Elevated left atrial  pressure.   2. Right ventricular systolic function is normal. The right ventricular  size is normal. Tricuspid regurgitation signal is inadequate for assessing  PA pressure.   3. Left atrial size was mildly dilated.   4. The mitral valve is degenerative. Mild mitral valve regurgitation. No  evidence of mitral stenosis.   5. Tricuspid valve regurgitation is moderate.   6. The aortic valve is tricuspid. Aortic valve regurgitation is not  visualized. Aortic  valve sclerosis/calcification is present, without any  evidence of aortic stenosis.   7. The inferior vena cava is normal in size with greater than 50%  respiratory variability, suggesting right atrial pressure of 3 mmHg.  CTA: Radiologist impression: 1. No pulmonary embolism. 2. Small bilateral pleural effusions, right greater than left. 3. Thickening of the peribronchovascular interstitium and interlobular septal thickening bilaterally. Correlate for any clinical signs or symptoms of congestive heart failure. 4. Aortic atherosclerotic calcification.  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 Currently Euvolemic No hypoxia - still her home oxygen  of 3L/min per day S/P IV Lasix  Uses PRN Lasix  at home ECHO worsened from an EF of 45-50% to 20 - 25% with GIIDD Per record review, she has not been able to tolerate GDMT due to hypotension, and it is unclear how beneficial this will be at this time, given her hospice status Need to clarify goals of care with her and the family Perhaps get palliative care on board prior to discharge back home For now, continue PRN Lasix  Monitor respiratory status closely Monitor and replete electrolytes  Headache: Chronic in nature No neurologic deficit on exam and negative signs of meningeal irritation Resume her home medication and adjust as needed Monitor neuro status closely  Acute on chronic CKD IIIa: Likely Cardiorenal Creatinine improved S/P Lasix , but slightly increased today in the setting of a recent CTA with contrast of her lungs yesterday Monitor renal functions closely while avoiding nephrotoxic  agents as much as possible. Encourage oral hydration in the meantime.   Discharge Planning:  Family wishes to discharge  today, 03/26/2024  Family Contact: Spoke with granddaughter Bobetta Cary on the phone. IDT: updated Goals of Care: DNR  Should patient need ambulance transfer at discharge - please use [facility]) as they contract this service for our active hospice patients.  Inocente Jacobs RN, BSN [facility] Liaison [phone]

## 2024-03-26 NOTE — Assessment & Plan Note (Addendum)
 Resolved. Cr improved to 1.11 from Cr 1.41 following diuresis, baseline ~1.2. supports cardiorenal syndrome. - AM BMP, to recheck creatinine after CTA  -Cr AM after CTA 1.18

## 2024-03-26 NOTE — Discharge Instructions (Signed)
 Thank you for letting us  care for you during your stay.  You were admitted to the [facility] Teaching Service.   You were admitted for dyspnea.  We found the following during your stay: worsening CHF.   Please let us  know if you have questions about your stay at [facility].

## 2024-03-26 NOTE — TOC Progression Note (Addendum)
 Transition of Care (TOC) - Progression Note   Patient being discharged . [facility] aware ( Melissa , Misty). Family requesting ambulance transport home. NCM confirmed address at bedside with patient and Bobetta Cary.   Signed DNR form on chart.   [facility] EMS paperwork on chart.   Patient will be ready at 4:15 pm per nurse.   [facility] called aware patient will be ready for  transport at  4:15 pm   Confined with family she has home oxygen  already  Patient Details  Name: Veronica Shaw MRN: [id number] Date of Birth: 1950/09/25  Transition of Care [facility]) CM/SW Contact  Zykeem Bauserman, Powell Jansky, RN Phone Number: 03/26/2024, 3:30 PM  Clinical Narrative:       Expected Discharge Plan: Home w Hospice Care Barriers to Discharge: Continued Medical Work up               Expected Discharge Plan and Services   Discharge Planning Services: CM Consult Post Acute Care Choice: Hospice Living arrangements for the past 2 months: Single Family Home Expected Discharge Date: 03/26/24                                     Social Drivers of Health (SDOH) Interventions SDOH Screenings   Food Insecurity: No Food Insecurity (03/26/2024)  Housing: Patient Unable To Answer (03/26/2024)  Transportation Needs: No Transportation Needs (03/26/2024)  Utilities: Not At Risk (03/26/2024)  Alcohol Screen: Low Risk  (07/01/2020)  Depression (PHQ2-9): High Risk (08/29/2022)  Financial Resource Strain: Low Risk  (07/01/2020)  Physical Activity: Sufficiently Active (07/01/2020)  Social Connections: Moderately Integrated (03/26/2024)  Stress: No Stress Concern Present (07/01/2020)  Tobacco Use: Low Risk  (03/25/2024)    Readmission Risk Interventions     No data to display

## 2024-03-26 NOTE — Progress Notes (Signed)
 Daily Progress Note Intern Pager: [phone]  Patient name: Veronica Shaw Medical record number: [id number] Date of birth: 12/06/1950 Age: 73 y.o. Gender: female  Primary Care Provider: Howell Lunger, DO Consultants: Cardiology Code Status:  DNR, DNI   Pt Overview and Major Events to Date:  8/24: admitted  Assessment and Plan: Patient is a 73 year old female who presented with new onset dyspnea requiring BIPAP secondary to CHF exacerbation, now improved and on home O2 after diuresis. Her CTA was negative for a PE. Believe this represents chronic progression of her HFrEF given the results of her echocardiogram. Pertinent PMH/PSH includes HFrEF (EF 45-50%), COPD.  Assessment & Plan Acute heart failure (HCC) Acute dyspnea Chronic respiratory failure (HCC) Respiratory distress Significantly improved today and on 2L [place] (home dose). SpO2 goal is 88-92% with history of COPD. Responded well to IV lasix  20 mg, on home lasix  20 every other day.  - lasix  20 mg po tomorrow - echo: EF of 20 to 25% previous EF of 45-50% on 09/2022 - strict I/os - daily weights - telemetry - K >4.0, Mg >2.0, repleted appropriately - AM BMP AKI (acute kidney injury) (HCC) Resolved. Cr improved to 1.11 from Cr 1.41 following diuresis, baseline ~1.2. supports cardiorenal syndrome. - AM BMP, to recheck creatinine after CTA  -Cr AM after CTA 1.18 Constipation Reports not having BM in 4 to 5 days - MiraLAX  17 g daily - senna daily - consider enema if constipation does not improve Chronic health problem Parkinson's - continue home carbidopa -levodopa  T2DM - holding home metformin , d/c SSI & cbg checks with meals and at night Asthma/COPD - continue home albuterol , singulair  Chronic pain/hospice - oxy 5 mg q4h prn, tylenol  1000 mg q6h scheduled   FEN/GI: Heart healthy PPx: Lovenox  Dispo:Home pending clinical improvement .  Subjective:  Per morning report patient was on room air, however, when I went  to bedside to examine patient she has been placed on 2 L nasal cannula.  I asked when she was placed back on oxygen , and if she was having any shortness of breath, she denied.  She says she is unsure when she was placed back on oxygen .  Patient continues to complain of head and neck pain, that is untouched by her home regimen of morphine  and Tylenol .  She says this headache comes and goes and she cannot tell if anything has been making it better.  Patient also says she has not had a bowel movement in 4 to 5 days. Will discuss how much maganement pt wishes to receive, and goals of care this afternoon.  Objective: Temp:  [97.8 F (36.6 C)-98.8 F (37.1 C)] 98.7 F (37.1 C) (08/26 0713) Pulse Rate:  [90-111] 92 (08/26 0713) Resp:  [14-23] 18 (08/26 0713) BP: (96-139)/(49-121) 111/65 (08/26 0713) SpO2:  [92 %-100 %] 92 % (08/26 0713) Weight:  [47.7 kg-49.8 kg] 49.8 kg (08/26 0456) Physical Exam: General: Chronically ill-appearing frail woman in a cervical collar Cardiovascular: Tachycardic, no m/r/g Respiratory: some diminished breath sounds Abdomen: Soft, nondistended, patient winces with light palpation, though denies pain Extremities: Cachectic  Laboratory: Most recent CBC Lab Results  Component Value Date   WBC 8.9 03/24/2024   HGB 12.2 03/24/2024   HCT 36.0 03/24/2024   MCV 94.0 03/24/2024   PLT 222 03/24/2024   Most recent BMP    Latest Ref Rng & Units 03/26/2024    2:25 AM  BMP  Glucose 70 - 99 mg/dL 885   BUN  8 - 23 mg/dL 13   Creatinine 9.55 - 1.00 mg/dL 8.81   Sodium 864 - 854 mmol/L 130   Potassium 3.5 - 5.1 mmol/L 4.5   Chloride 98 - 111 mmol/L 96   CO2 22 - 32 mmol/L 26   Calcium  8.9 - 10.3 mg/dL 9.3     Echo Radiologist impression:  1. Left ventricular ejection fraction, by estimation, is 20 to 25%. The  left ventricle has severely decreased function. The left ventricle has no  regional wall motion abnormalities. Left ventricular diastolic parameters  are  consistent with Grade II  diastolic dysfunction (pseudonormalization). Elevated left atrial  pressure.   2. Right ventricular systolic function is normal. The right ventricular  size is normal. Tricuspid regurgitation signal is inadequate for assessing  PA pressure.   3. Left atrial size was mildly dilated.   4. The mitral valve is degenerative. Mild mitral valve regurgitation. No  evidence of mitral stenosis.   5. Tricuspid valve regurgitation is moderate.   6. The aortic valve is tricuspid. Aortic valve regurgitation is not  visualized. Aortic valve sclerosis/calcification is present, without any  evidence of aortic stenosis.   7. The inferior vena cava is normal in size with greater than 50%  respiratory variability, suggesting right atrial pressure of 3 mmHg.   CTA: Radiologist impression: 1. No pulmonary embolism. 2. Small bilateral pleural effusions, right greater than left. 3. Thickening of the peribronchovascular interstitium and interlobular septal thickening bilaterally. Correlate for any clinical signs or symptoms of congestive heart failure. 4. Aortic atherosclerotic calcification.    Idelle Nakai, DO 03/26/2024, 7:19 AM  PGY-1, [facility] FPTS Intern pager: [phone], text pages welcome Secure chat group [facility] Teaching Service

## 2024-03-26 NOTE — Discharge Summary (Addendum)
 Family Medicine Teaching [facility] Discharge Summary  Patient name: Veronica Shaw Medical record number: [id number] Date of birth: [birth year] Age: 73 y.o. Gender: female Date of Admission: 03/24/2024  Date of Discharge: 03/26/2024 Admitting Physician: Darren Jernigan, DO  Primary Care Provider: Howell Lunger, DO Consultants: Cardiology  Indication for Hospitalization: Dyspnea  Discharge Diagnoses/Problem List:  Principal Problem for Admission: Dyspnea Other Problems addressed during stay:  Principal Problem:   Acute on chronic congestive heart failure (HCC) Active Problems:   Goals of care, counseling/discussion   AKI (acute kidney injury) (HCC)   Acute dyspnea   Elevated troponin   Chronic health problem   Chronic respiratory failure [facility])   Respiratory distress   Constipation  Brief Hospital Course:  This is a 73 year old female, with PMH of CKD, T2DM, HTN, Parkinson, on home hospice, admitted with acute dyspnea secondary to CHF exacerbation.   Dyspnea  CHF Exacerbation Patient requiring BIPAP on admission for WOB. BNP 1206. D-dimer slightly elevated. CTA PE negative for PE. IV lasix  20 mg administered and patient weaned to home 2L [place]. ECHO revealed EF of 20-25% with grade II diastolic dysfunction, which is worsening from previous echo on 09/2022.  Unclear cause of exacerbation, likely chronic worsening of HF. Ultimately, patient and family decided not to pursue medical management and instead return home with hospice.  Elevated Troponin Troponin 13>39, then trended flat. Cardiology consulted, suspect secondary to demand ischemia.   AKI Creatinine elevated to 1.4 on admission. Baseline ~1.2. Creatinine improved to 1.1 after diuresis. Suspect secondary to cardiorenal syndrome.  Creatinine was trended throughout her hospitalization, remained stable after CTA.  PCP recommendations:  Follow up with hospice provider, may need to consider adjusting Lasix  regimen for  comfort Evaluate etiology/pain regimen for chronic headache, neck pain Continued goals of care discussions   Results/Tests Pending at Time of Discharge:  Unresulted Labs (From admission, onward)     Start     Ordered   03/27/24 0500  Basic metabolic panel with GFR  Tomorrow morning,   R       Question:  Specimen collection method  Answer:  Lab=Lab collect   03/26/24 1028           Disposition: Home with hospice care  Discharge Condition: Stable  Discharge Exam:  Vitals:   03/26/24 0713 03/26/24 1100  BP: 111/65 107/63  Pulse: 92 85  Resp: 18 13  Temp: 98.7 F (37.1 C) 98.7 F (37.1 C)  SpO2: 92% 98%   Significant Procedures:  Echocardiogram: 1. Left ventricular ejection fraction, by estimation, is 20 to 25%. The  left ventricle has severely decreased function. The left ventricle has no  regional wall motion abnormalities. Left ventricular diastolic parameters  are consistent with Grade II  diastolic dysfunction (pseudonormalization). Elevated left atrial  pressure.   2. Right ventricular systolic function is normal. The right ventricular  size is normal. Tricuspid regurgitation signal is inadequate for assessing  PA pressure.   3. Left atrial size was mildly dilated.   4. The mitral valve is degenerative. Mild mitral valve regurgitation. No  evidence of mitral stenosis.   5. Tricuspid valve regurgitation is moderate.   6. The aortic valve is tricuspid. Aortic valve regurgitation is not  visualized. Aortic valve sclerosis/calcification is present, without any  evidence of aortic stenosis.   7. The inferior vena cava is normal in size with greater than 50%  respiratory variability, suggesting right atrial pressure of 3 mmHg.   Significant Labs and  Imaging:  Recent Labs  Lab 03/24/24 1953 03/24/24 2021  WBC 8.9  --   HGB 11.9* 12.2  HCT 37.7 36.0  PLT 222  --    Recent Labs  Lab 03/24/24 1953 03/24/24 2021 03/25/24 0329 03/26/24 0225  NA 133* 134*  137 130*  K 4.0 4.1 3.5 4.5  CL 103  --  109 96*  CO2 21*  --  23 26  GLUCOSE 171*  --  98 114*  BUN 10  --  9 13  CREATININE 1.41*  --  1.11* 1.18*  CALCIUM  9.0  --  8.0* 9.3  MG  --   --  1.4* 1.8  ALKPHOS 82  --   --   --   AST 19  --   --   --   ALT <5  --   --   --   ALBUMIN 3.3*  --   --   --     CTA: Radiologist impression: 1. No pulmonary embolism. 2. Small bilateral pleural effusions, right greater than left. 3. Thickening of the peribronchovascular interstitium and interlobular septal thickening bilaterally. Correlate for any clinical signs or symptoms of congestive heart failure. 4. Aortic atherosclerotic calcification.   Discharge Medications:  Allergies as of 03/26/2024       Reactions   Aspirin  Swelling, Other (See Comments)   Other reaction(s): Other (See Comments) Causes nose bleeds *ONLY THE COATED ASA* Causes nose bleeds *ONLY THE COATED ASA*   Mirtazapine  Other (See Comments)   hallucinations, sucidal thoughts   Penicillins Shortness Of Breath, Rash   Shortness of Breath - Throat felt like it was closing.    Prempro [conj Estrog-medroxyprogest Ace] Shortness Of Breath   Throat swelling Throat swelling   Ace Inhibitors Cough   Simvastatin Other (See Comments), Rash   Muscle aches        Medication List     PAUSE taking these medications    metFORMIN  500 MG 24 hr tablet Wait to take this until your doctor or other care provider tells you to start again. Commonly known as: GLUCOPHAGE -XR Take 500 mg by mouth daily.   oxybutynin 5 MG tablet Wait to take this until your doctor or other care provider tells you to start again. Commonly known as: DITROPAN Take 5 mg by mouth at bedtime.       STOP taking these medications    sulfamethoxazole -trimethoprim  800-160 MG tablet Commonly known as: BACTRIM  DS       TAKE these medications    acetaminophen  500 MG tablet Commonly known as: TYLENOL  Take 2 tablets (1,000 mg total) by mouth  every 8 (eight) hours as needed.   albuterol  (2.5 MG/3ML) 0.083% nebulizer solution Commonly known as: PROVENTIL  Inhale 2.5 mg into the lungs every 4 (four) hours as needed for wheezing or shortness of breath.   Ventolin  HFA 108 (90 Base) MCG/ACT inhaler Generic drug: albuterol  Inhale 2 puffs into the lungs every 6 (six) hours as needed for wheezing or shortness of breath.   carbidopa -levodopa  25-100 MG tablet Commonly known as: SINEMET  IR TAKE 2 TABLETS AT 7 AM, 2 TABLETS AT 11AM, AND 1 TABLET AT 4 PM What changed:  how much to take how to take this when to take this additional instructions   diclofenac  Sodium 1 % Gel Commonly known as: VOLTAREN  APPLY 4 GRAMS TOPICALLY FOUR TIMES DAILY What changed: See the new instructions.   famotidine  20 MG tablet Commonly known as: PEPCID  TAKE 1 TABLET TWICE DAILY What  changed: when to take this   furosemide  20 MG tablet Commonly known as: LASIX  Take 20 mg by mouth every other day.   gabapentin  300 MG capsule Commonly known as: NEURONTIN  Take 300 mg by mouth 3 (three) times daily.   ibuprofen 200 MG tablet Commonly known as: ADVIL Take 600 mg by mouth every 6 (six) hours as needed for mild pain (pain score 1-3) or moderate pain (pain score 4-6).   lidocaine  4 % Place 1 patch onto the skin daily.   meclizine  25 MG tablet Commonly known as: ANTIVERT  Take 1 tablet (25 mg total) by mouth 3 (three) times daily as needed for dizziness.   melatonin 5 MG Tabs Take 5 mg by mouth at bedtime as needed (sleep related issues).   montelukast  10 MG tablet Commonly known as: SINGULAIR  Take 1 tablet (10 mg total) by mouth at bedtime.   morphine  CONCENTRATE 10 mg / 0.5 ml concentrated solution Take 5 mg by mouth every 4 (four) hours as needed for moderate pain (pain score 4-6), severe pain (pain score 7-10) or shortness of breath.   oxyCODONE  5 MG immediate release tablet Commonly known as: Oxy IR/ROXICODONE  Take 5 mg by mouth every 4  (four) hours as needed.   OXYGEN  Inhale into the lungs at bedtime. Inhale 2L into lungs   Polyethyl Glycol-Propyl Glycol 0.4-0.3 % Gel ophthalmic gel Commonly known as: SYSTANE Place 1 Application into both eyes in the morning.   polyethylene glycol 17 g packet Commonly known as: MIRALAX  / GLYCOLAX  Take 17 g by mouth daily. What changed:  when to take this reasons to take this        Discharge Instructions: Please refer to Patient Instructions section of EMR for full details.  Patient was counseled important signs and symptoms that should prompt return to medical care, changes in medications, dietary instructions, activity restrictions, and follow up appointments.   Follow-Up Appointments:   Alfornia Light, DO PGY-1, [facility]   Upper Level Addendum: I have seen and evaluated this patient along with Dr. Light and reviewed the above note, making necessary revisions as appropriate. I agree with the medical decision making and physical exam as noted above. Gladis Church, DO PGY-3 [facility] Residency

## 2024-03-26 NOTE — Plan of Care (Signed)
  Problem: Coping: Goal: Ability to adjust to condition or change in health will improve Outcome: Progressing   Problem: Fluid Volume: Goal: Ability to maintain a balanced intake and output will improve Outcome: Progressing   Problem: Health Behavior/Discharge Planning: Goal: Ability to identify and utilize available resources and services will improve Outcome: Progressing   Problem: Metabolic: Goal: Ability to maintain appropriate glucose levels will improve Outcome: Progressing   Problem: Nutritional: Goal: Maintenance of adequate nutrition will improve Outcome: Progressing   Problem: Tissue Perfusion: Goal: Adequacy of tissue perfusion will improve Outcome: Progressing   Problem: Education: Goal: Knowledge of General Education information will improve Description: Including pain rating scale, medication(s)/side effects and non-pharmacologic comfort measures Outcome: Progressing   Problem: Clinical Measurements: Goal: Respiratory complications will improve Outcome: Progressing Goal: Cardiovascular complication will be avoided Outcome: Progressing   Problem: Pain Managment: Goal: General experience of comfort will improve and/or be controlled Outcome: Progressing

## 2024-03-26 NOTE — Plan of Care (Signed)
 Went to discuss goals of care at patient bedside with Dr. Howell.  Patient made the decision to go home with home hospice.  They have already contacted home hospice and they are ready to receive her assume care as soon as she is home.  She is very depressed and is ready to be home and does not wish to be in pain any longer.  [name], her granddaughter and power of attorney, was at bedside.  She was able to participate in the goals of care discussion with the patient.

## 2024-03-27 DIAGNOSIS — I951 Orthostatic hypotension: Secondary | ICD-10-CM | POA: Diagnosis not present

## 2024-03-27 DIAGNOSIS — E119 Type 2 diabetes mellitus without complications: Secondary | ICD-10-CM | POA: Diagnosis not present

## 2024-03-27 DIAGNOSIS — R630 Anorexia: Secondary | ICD-10-CM | POA: Diagnosis not present

## 2024-03-27 DIAGNOSIS — I10 Essential (primary) hypertension: Secondary | ICD-10-CM | POA: Diagnosis not present

## 2024-03-27 DIAGNOSIS — G8194 Hemiplegia, unspecified affecting left nondominant side: Secondary | ICD-10-CM | POA: Diagnosis not present

## 2024-03-27 DIAGNOSIS — G20A1 Parkinson's disease without dyskinesia, without mention of fluctuations: Secondary | ICD-10-CM | POA: Diagnosis not present

## 2024-03-29 DIAGNOSIS — I10 Essential (primary) hypertension: Secondary | ICD-10-CM | POA: Diagnosis not present

## 2024-03-29 DIAGNOSIS — G20A1 Parkinson's disease without dyskinesia, without mention of fluctuations: Secondary | ICD-10-CM | POA: Diagnosis not present

## 2024-03-29 DIAGNOSIS — I951 Orthostatic hypotension: Secondary | ICD-10-CM | POA: Diagnosis not present

## 2024-03-29 DIAGNOSIS — G8194 Hemiplegia, unspecified affecting left nondominant side: Secondary | ICD-10-CM | POA: Diagnosis not present

## 2024-03-29 DIAGNOSIS — E119 Type 2 diabetes mellitus without complications: Secondary | ICD-10-CM | POA: Diagnosis not present

## 2024-03-29 DIAGNOSIS — R630 Anorexia: Secondary | ICD-10-CM | POA: Diagnosis not present

## 2024-04-01 ENCOUNTER — Ambulatory Visit

## 2024-04-01 DIAGNOSIS — I509 Heart failure, unspecified: Secondary | ICD-10-CM | POA: Diagnosis not present

## 2024-04-01 DIAGNOSIS — G8194 Hemiplegia, unspecified affecting left nondominant side: Secondary | ICD-10-CM | POA: Diagnosis not present

## 2024-04-01 DIAGNOSIS — I10 Essential (primary) hypertension: Secondary | ICD-10-CM | POA: Diagnosis not present

## 2024-04-01 DIAGNOSIS — I951 Orthostatic hypotension: Secondary | ICD-10-CM | POA: Diagnosis not present

## 2024-04-01 DIAGNOSIS — E785 Hyperlipidemia, unspecified: Secondary | ICD-10-CM | POA: Diagnosis not present

## 2024-04-01 DIAGNOSIS — R000 Tachycardia, unspecified: Secondary | ICD-10-CM | POA: Diagnosis not present

## 2024-04-01 DIAGNOSIS — M47812 Spondylosis without myelopathy or radiculopathy, cervical region: Secondary | ICD-10-CM | POA: Diagnosis not present

## 2024-04-01 DIAGNOSIS — E119 Type 2 diabetes mellitus without complications: Secondary | ICD-10-CM | POA: Diagnosis not present

## 2024-04-01 DIAGNOSIS — K219 Gastro-esophageal reflux disease without esophagitis: Secondary | ICD-10-CM | POA: Diagnosis not present

## 2024-04-01 DIAGNOSIS — G20A1 Parkinson's disease without dyskinesia, without mention of fluctuations: Secondary | ICD-10-CM | POA: Diagnosis not present

## 2024-04-01 DIAGNOSIS — R630 Anorexia: Secondary | ICD-10-CM | POA: Diagnosis not present

## 2024-04-01 DIAGNOSIS — I428 Other cardiomyopathies: Secondary | ICD-10-CM | POA: Diagnosis not present

## 2024-04-03 DIAGNOSIS — I10 Essential (primary) hypertension: Secondary | ICD-10-CM | POA: Diagnosis not present

## 2024-04-03 DIAGNOSIS — I951 Orthostatic hypotension: Secondary | ICD-10-CM | POA: Diagnosis not present

## 2024-04-03 DIAGNOSIS — G20A1 Parkinson's disease without dyskinesia, without mention of fluctuations: Secondary | ICD-10-CM | POA: Diagnosis not present

## 2024-04-03 DIAGNOSIS — E119 Type 2 diabetes mellitus without complications: Secondary | ICD-10-CM | POA: Diagnosis not present

## 2024-04-03 DIAGNOSIS — R630 Anorexia: Secondary | ICD-10-CM | POA: Diagnosis not present

## 2024-04-03 DIAGNOSIS — G8194 Hemiplegia, unspecified affecting left nondominant side: Secondary | ICD-10-CM | POA: Diagnosis not present

## 2024-04-03 LAB — CUP PACEART REMOTE DEVICE CHECK
Battery Remaining Longevity: 1 mo
Battery Voltage: 2.75 V
Brady Statistic AP VP Percent: 0 %
Brady Statistic AP VS Percent: 0 %
Brady Statistic AS VP Percent: 0.03 %
Brady Statistic AS VS Percent: 99.96 %
Brady Statistic RA Percent Paced: 0.01 %
Brady Statistic RV Percent Paced: 0.04 %
Date Time Interrogation Session: 20250901022603
HighPow Impedance: 60 Ohm
Implantable Lead Connection Status: 753985
Implantable Lead Connection Status: 753985
Implantable Lead Implant Date: 20150427
Implantable Lead Implant Date: 20150427
Implantable Lead Location: 753859
Implantable Lead Location: 753860
Implantable Lead Model: 5076
Implantable Lead Model: 6935
Implantable Pulse Generator Implant Date: 20150427
Lead Channel Impedance Value: 342 Ohm
Lead Channel Impedance Value: 361 Ohm
Lead Channel Impedance Value: 4047 Ohm
Lead Channel Impedance Value: 4047 Ohm
Lead Channel Impedance Value: 4047 Ohm
Lead Channel Impedance Value: 418 Ohm
Lead Channel Pacing Threshold Amplitude: 0.625 V
Lead Channel Pacing Threshold Amplitude: 0.75 V
Lead Channel Pacing Threshold Pulse Width: 0.4 ms
Lead Channel Pacing Threshold Pulse Width: 0.4 ms
Lead Channel Sensing Intrinsic Amplitude: 1.125 mV
Lead Channel Sensing Intrinsic Amplitude: 1.125 mV
Lead Channel Sensing Intrinsic Amplitude: 19.625 mV
Lead Channel Sensing Intrinsic Amplitude: 19.625 mV
Lead Channel Setting Pacing Amplitude: 2 V
Lead Channel Setting Pacing Amplitude: 2.5 V
Lead Channel Setting Pacing Pulse Width: 0.4 ms
Lead Channel Setting Sensing Sensitivity: 0.3 mV
Zone Setting Status: 755011
Zone Setting Status: 755011
Zone Setting Status: 755011

## 2024-04-04 DIAGNOSIS — E119 Type 2 diabetes mellitus without complications: Secondary | ICD-10-CM | POA: Diagnosis not present

## 2024-04-04 DIAGNOSIS — I951 Orthostatic hypotension: Secondary | ICD-10-CM | POA: Diagnosis not present

## 2024-04-04 DIAGNOSIS — R630 Anorexia: Secondary | ICD-10-CM | POA: Diagnosis not present

## 2024-04-04 DIAGNOSIS — G8194 Hemiplegia, unspecified affecting left nondominant side: Secondary | ICD-10-CM | POA: Diagnosis not present

## 2024-04-04 DIAGNOSIS — I10 Essential (primary) hypertension: Secondary | ICD-10-CM | POA: Diagnosis not present

## 2024-04-04 DIAGNOSIS — G20A1 Parkinson's disease without dyskinesia, without mention of fluctuations: Secondary | ICD-10-CM | POA: Diagnosis not present

## 2024-04-05 DIAGNOSIS — G20A1 Parkinson's disease without dyskinesia, without mention of fluctuations: Secondary | ICD-10-CM | POA: Diagnosis not present

## 2024-04-05 DIAGNOSIS — E119 Type 2 diabetes mellitus without complications: Secondary | ICD-10-CM | POA: Diagnosis not present

## 2024-04-05 DIAGNOSIS — I10 Essential (primary) hypertension: Secondary | ICD-10-CM | POA: Diagnosis not present

## 2024-04-05 DIAGNOSIS — R630 Anorexia: Secondary | ICD-10-CM | POA: Diagnosis not present

## 2024-04-05 DIAGNOSIS — I951 Orthostatic hypotension: Secondary | ICD-10-CM | POA: Diagnosis not present

## 2024-04-05 DIAGNOSIS — G8194 Hemiplegia, unspecified affecting left nondominant side: Secondary | ICD-10-CM | POA: Diagnosis not present

## 2024-04-07 ENCOUNTER — Ambulatory Visit: Payer: Self-pay | Admitting: Internal Medicine

## 2024-04-08 DIAGNOSIS — I951 Orthostatic hypotension: Secondary | ICD-10-CM | POA: Diagnosis not present

## 2024-04-08 DIAGNOSIS — R630 Anorexia: Secondary | ICD-10-CM | POA: Diagnosis not present

## 2024-04-08 DIAGNOSIS — G8194 Hemiplegia, unspecified affecting left nondominant side: Secondary | ICD-10-CM | POA: Diagnosis not present

## 2024-04-08 DIAGNOSIS — I10 Essential (primary) hypertension: Secondary | ICD-10-CM | POA: Diagnosis not present

## 2024-04-08 DIAGNOSIS — G20A1 Parkinson's disease without dyskinesia, without mention of fluctuations: Secondary | ICD-10-CM | POA: Diagnosis not present

## 2024-04-08 DIAGNOSIS — E119 Type 2 diabetes mellitus without complications: Secondary | ICD-10-CM | POA: Diagnosis not present

## 2024-04-10 DIAGNOSIS — I951 Orthostatic hypotension: Secondary | ICD-10-CM | POA: Diagnosis not present

## 2024-04-10 DIAGNOSIS — R630 Anorexia: Secondary | ICD-10-CM | POA: Diagnosis not present

## 2024-04-10 DIAGNOSIS — G8194 Hemiplegia, unspecified affecting left nondominant side: Secondary | ICD-10-CM | POA: Diagnosis not present

## 2024-04-10 DIAGNOSIS — E119 Type 2 diabetes mellitus without complications: Secondary | ICD-10-CM | POA: Diagnosis not present

## 2024-04-10 DIAGNOSIS — I10 Essential (primary) hypertension: Secondary | ICD-10-CM | POA: Diagnosis not present

## 2024-04-10 DIAGNOSIS — G20A1 Parkinson's disease without dyskinesia, without mention of fluctuations: Secondary | ICD-10-CM | POA: Diagnosis not present

## 2024-04-11 DIAGNOSIS — G20A1 Parkinson's disease without dyskinesia, without mention of fluctuations: Secondary | ICD-10-CM | POA: Diagnosis not present

## 2024-04-11 DIAGNOSIS — I951 Orthostatic hypotension: Secondary | ICD-10-CM | POA: Diagnosis not present

## 2024-04-11 DIAGNOSIS — G8194 Hemiplegia, unspecified affecting left nondominant side: Secondary | ICD-10-CM | POA: Diagnosis not present

## 2024-04-11 DIAGNOSIS — E119 Type 2 diabetes mellitus without complications: Secondary | ICD-10-CM | POA: Diagnosis not present

## 2024-04-11 DIAGNOSIS — I10 Essential (primary) hypertension: Secondary | ICD-10-CM | POA: Diagnosis not present

## 2024-04-11 DIAGNOSIS — R630 Anorexia: Secondary | ICD-10-CM | POA: Diagnosis not present

## 2024-04-12 DIAGNOSIS — E119 Type 2 diabetes mellitus without complications: Secondary | ICD-10-CM | POA: Diagnosis not present

## 2024-04-12 DIAGNOSIS — R630 Anorexia: Secondary | ICD-10-CM | POA: Diagnosis not present

## 2024-04-12 DIAGNOSIS — G20A1 Parkinson's disease without dyskinesia, without mention of fluctuations: Secondary | ICD-10-CM | POA: Diagnosis not present

## 2024-04-12 DIAGNOSIS — I10 Essential (primary) hypertension: Secondary | ICD-10-CM | POA: Diagnosis not present

## 2024-04-12 DIAGNOSIS — I951 Orthostatic hypotension: Secondary | ICD-10-CM | POA: Diagnosis not present

## 2024-04-12 DIAGNOSIS — G8194 Hemiplegia, unspecified affecting left nondominant side: Secondary | ICD-10-CM | POA: Diagnosis not present

## 2024-04-15 DIAGNOSIS — G8194 Hemiplegia, unspecified affecting left nondominant side: Secondary | ICD-10-CM | POA: Diagnosis not present

## 2024-04-15 DIAGNOSIS — I10 Essential (primary) hypertension: Secondary | ICD-10-CM | POA: Diagnosis not present

## 2024-04-15 DIAGNOSIS — E119 Type 2 diabetes mellitus without complications: Secondary | ICD-10-CM | POA: Diagnosis not present

## 2024-04-15 DIAGNOSIS — R630 Anorexia: Secondary | ICD-10-CM | POA: Diagnosis not present

## 2024-04-15 DIAGNOSIS — G20A1 Parkinson's disease without dyskinesia, without mention of fluctuations: Secondary | ICD-10-CM | POA: Diagnosis not present

## 2024-04-15 DIAGNOSIS — I951 Orthostatic hypotension: Secondary | ICD-10-CM | POA: Diagnosis not present

## 2024-04-17 DIAGNOSIS — E119 Type 2 diabetes mellitus without complications: Secondary | ICD-10-CM | POA: Diagnosis not present

## 2024-04-17 DIAGNOSIS — I10 Essential (primary) hypertension: Secondary | ICD-10-CM | POA: Diagnosis not present

## 2024-04-17 DIAGNOSIS — I951 Orthostatic hypotension: Secondary | ICD-10-CM | POA: Diagnosis not present

## 2024-04-17 DIAGNOSIS — R630 Anorexia: Secondary | ICD-10-CM | POA: Diagnosis not present

## 2024-04-17 DIAGNOSIS — G20A1 Parkinson's disease without dyskinesia, without mention of fluctuations: Secondary | ICD-10-CM | POA: Diagnosis not present

## 2024-04-17 DIAGNOSIS — G8194 Hemiplegia, unspecified affecting left nondominant side: Secondary | ICD-10-CM | POA: Diagnosis not present

## 2024-04-18 DIAGNOSIS — G20A1 Parkinson's disease without dyskinesia, without mention of fluctuations: Secondary | ICD-10-CM | POA: Diagnosis not present

## 2024-04-18 DIAGNOSIS — E119 Type 2 diabetes mellitus without complications: Secondary | ICD-10-CM | POA: Diagnosis not present

## 2024-04-18 DIAGNOSIS — I951 Orthostatic hypotension: Secondary | ICD-10-CM | POA: Diagnosis not present

## 2024-04-18 DIAGNOSIS — R630 Anorexia: Secondary | ICD-10-CM | POA: Diagnosis not present

## 2024-04-18 DIAGNOSIS — I10 Essential (primary) hypertension: Secondary | ICD-10-CM | POA: Diagnosis not present

## 2024-04-18 DIAGNOSIS — G8194 Hemiplegia, unspecified affecting left nondominant side: Secondary | ICD-10-CM | POA: Diagnosis not present

## 2024-04-19 DIAGNOSIS — G20A1 Parkinson's disease without dyskinesia, without mention of fluctuations: Secondary | ICD-10-CM | POA: Diagnosis not present

## 2024-04-19 DIAGNOSIS — I10 Essential (primary) hypertension: Secondary | ICD-10-CM | POA: Diagnosis not present

## 2024-04-19 DIAGNOSIS — E119 Type 2 diabetes mellitus without complications: Secondary | ICD-10-CM | POA: Diagnosis not present

## 2024-04-19 DIAGNOSIS — G8194 Hemiplegia, unspecified affecting left nondominant side: Secondary | ICD-10-CM | POA: Diagnosis not present

## 2024-04-19 DIAGNOSIS — I951 Orthostatic hypotension: Secondary | ICD-10-CM | POA: Diagnosis not present

## 2024-04-19 DIAGNOSIS — R630 Anorexia: Secondary | ICD-10-CM | POA: Diagnosis not present

## 2024-04-23 DIAGNOSIS — I951 Orthostatic hypotension: Secondary | ICD-10-CM | POA: Diagnosis not present

## 2024-04-23 DIAGNOSIS — I10 Essential (primary) hypertension: Secondary | ICD-10-CM | POA: Diagnosis not present

## 2024-04-23 DIAGNOSIS — G8194 Hemiplegia, unspecified affecting left nondominant side: Secondary | ICD-10-CM | POA: Diagnosis not present

## 2024-04-23 DIAGNOSIS — R630 Anorexia: Secondary | ICD-10-CM | POA: Diagnosis not present

## 2024-04-23 DIAGNOSIS — E119 Type 2 diabetes mellitus without complications: Secondary | ICD-10-CM | POA: Diagnosis not present

## 2024-04-23 DIAGNOSIS — G20A1 Parkinson's disease without dyskinesia, without mention of fluctuations: Secondary | ICD-10-CM | POA: Diagnosis not present

## 2024-04-24 DIAGNOSIS — G8194 Hemiplegia, unspecified affecting left nondominant side: Secondary | ICD-10-CM | POA: Diagnosis not present

## 2024-04-24 DIAGNOSIS — G20A1 Parkinson's disease without dyskinesia, without mention of fluctuations: Secondary | ICD-10-CM | POA: Diagnosis not present

## 2024-04-24 DIAGNOSIS — R630 Anorexia: Secondary | ICD-10-CM | POA: Diagnosis not present

## 2024-04-24 DIAGNOSIS — I951 Orthostatic hypotension: Secondary | ICD-10-CM | POA: Diagnosis not present

## 2024-04-24 DIAGNOSIS — I10 Essential (primary) hypertension: Secondary | ICD-10-CM | POA: Diagnosis not present

## 2024-04-24 DIAGNOSIS — E119 Type 2 diabetes mellitus without complications: Secondary | ICD-10-CM | POA: Diagnosis not present

## 2024-04-25 DIAGNOSIS — E119 Type 2 diabetes mellitus without complications: Secondary | ICD-10-CM | POA: Diagnosis not present

## 2024-04-25 DIAGNOSIS — I10 Essential (primary) hypertension: Secondary | ICD-10-CM | POA: Diagnosis not present

## 2024-04-25 DIAGNOSIS — I951 Orthostatic hypotension: Secondary | ICD-10-CM | POA: Diagnosis not present

## 2024-04-25 DIAGNOSIS — G20A1 Parkinson's disease without dyskinesia, without mention of fluctuations: Secondary | ICD-10-CM | POA: Diagnosis not present

## 2024-04-25 DIAGNOSIS — G8194 Hemiplegia, unspecified affecting left nondominant side: Secondary | ICD-10-CM | POA: Diagnosis not present

## 2024-04-25 DIAGNOSIS — R630 Anorexia: Secondary | ICD-10-CM | POA: Diagnosis not present

## 2024-04-26 DIAGNOSIS — I10 Essential (primary) hypertension: Secondary | ICD-10-CM | POA: Diagnosis not present

## 2024-04-26 DIAGNOSIS — I951 Orthostatic hypotension: Secondary | ICD-10-CM | POA: Diagnosis not present

## 2024-04-26 DIAGNOSIS — G20A1 Parkinson's disease without dyskinesia, without mention of fluctuations: Secondary | ICD-10-CM | POA: Diagnosis not present

## 2024-04-26 DIAGNOSIS — E119 Type 2 diabetes mellitus without complications: Secondary | ICD-10-CM | POA: Diagnosis not present

## 2024-04-26 DIAGNOSIS — R630 Anorexia: Secondary | ICD-10-CM | POA: Diagnosis not present

## 2024-04-26 DIAGNOSIS — G8194 Hemiplegia, unspecified affecting left nondominant side: Secondary | ICD-10-CM | POA: Diagnosis not present

## 2024-04-27 DIAGNOSIS — R630 Anorexia: Secondary | ICD-10-CM | POA: Diagnosis not present

## 2024-04-27 DIAGNOSIS — I10 Essential (primary) hypertension: Secondary | ICD-10-CM | POA: Diagnosis not present

## 2024-04-27 DIAGNOSIS — E119 Type 2 diabetes mellitus without complications: Secondary | ICD-10-CM | POA: Diagnosis not present

## 2024-04-27 DIAGNOSIS — G8194 Hemiplegia, unspecified affecting left nondominant side: Secondary | ICD-10-CM | POA: Diagnosis not present

## 2024-04-27 DIAGNOSIS — G20A1 Parkinson's disease without dyskinesia, without mention of fluctuations: Secondary | ICD-10-CM | POA: Diagnosis not present

## 2024-04-27 DIAGNOSIS — I951 Orthostatic hypotension: Secondary | ICD-10-CM | POA: Diagnosis not present

## 2024-04-28 DIAGNOSIS — G20A1 Parkinson's disease without dyskinesia, without mention of fluctuations: Secondary | ICD-10-CM | POA: Diagnosis not present

## 2024-04-28 DIAGNOSIS — R630 Anorexia: Secondary | ICD-10-CM | POA: Diagnosis not present

## 2024-04-28 DIAGNOSIS — G8194 Hemiplegia, unspecified affecting left nondominant side: Secondary | ICD-10-CM | POA: Diagnosis not present

## 2024-04-28 DIAGNOSIS — I10 Essential (primary) hypertension: Secondary | ICD-10-CM | POA: Diagnosis not present

## 2024-04-28 DIAGNOSIS — I951 Orthostatic hypotension: Secondary | ICD-10-CM | POA: Diagnosis not present

## 2024-04-28 DIAGNOSIS — E119 Type 2 diabetes mellitus without complications: Secondary | ICD-10-CM | POA: Diagnosis not present

## 2024-04-29 DIAGNOSIS — I951 Orthostatic hypotension: Secondary | ICD-10-CM | POA: Diagnosis not present

## 2024-04-29 DIAGNOSIS — I10 Essential (primary) hypertension: Secondary | ICD-10-CM | POA: Diagnosis not present

## 2024-04-29 DIAGNOSIS — E119 Type 2 diabetes mellitus without complications: Secondary | ICD-10-CM | POA: Diagnosis not present

## 2024-04-29 DIAGNOSIS — G20A1 Parkinson's disease without dyskinesia, without mention of fluctuations: Secondary | ICD-10-CM | POA: Diagnosis not present

## 2024-04-29 DIAGNOSIS — R630 Anorexia: Secondary | ICD-10-CM | POA: Diagnosis not present

## 2024-04-29 DIAGNOSIS — G8194 Hemiplegia, unspecified affecting left nondominant side: Secondary | ICD-10-CM | POA: Diagnosis not present

## 2024-04-30 DIAGNOSIS — G20A1 Parkinson's disease without dyskinesia, without mention of fluctuations: Secondary | ICD-10-CM | POA: Diagnosis not present

## 2024-04-30 DIAGNOSIS — I951 Orthostatic hypotension: Secondary | ICD-10-CM | POA: Diagnosis not present

## 2024-04-30 DIAGNOSIS — I10 Essential (primary) hypertension: Secondary | ICD-10-CM | POA: Diagnosis not present

## 2024-04-30 DIAGNOSIS — E119 Type 2 diabetes mellitus without complications: Secondary | ICD-10-CM | POA: Diagnosis not present

## 2024-04-30 DIAGNOSIS — R630 Anorexia: Secondary | ICD-10-CM | POA: Diagnosis not present

## 2024-04-30 DIAGNOSIS — G8194 Hemiplegia, unspecified affecting left nondominant side: Secondary | ICD-10-CM | POA: Diagnosis not present

## 2024-05-01 DIAGNOSIS — I10 Essential (primary) hypertension: Secondary | ICD-10-CM | POA: Diagnosis not present

## 2024-05-01 DIAGNOSIS — I951 Orthostatic hypotension: Secondary | ICD-10-CM | POA: Diagnosis not present

## 2024-05-01 DIAGNOSIS — I428 Other cardiomyopathies: Secondary | ICD-10-CM | POA: Diagnosis not present

## 2024-05-01 DIAGNOSIS — G8194 Hemiplegia, unspecified affecting left nondominant side: Secondary | ICD-10-CM | POA: Diagnosis not present

## 2024-05-01 DIAGNOSIS — E119 Type 2 diabetes mellitus without complications: Secondary | ICD-10-CM | POA: Diagnosis not present

## 2024-05-01 DIAGNOSIS — G20A1 Parkinson's disease without dyskinesia, without mention of fluctuations: Secondary | ICD-10-CM | POA: Diagnosis not present

## 2024-05-01 DIAGNOSIS — K219 Gastro-esophageal reflux disease without esophagitis: Secondary | ICD-10-CM | POA: Diagnosis not present

## 2024-05-01 DIAGNOSIS — R630 Anorexia: Secondary | ICD-10-CM | POA: Diagnosis not present

## 2024-05-01 DIAGNOSIS — E785 Hyperlipidemia, unspecified: Secondary | ICD-10-CM | POA: Diagnosis not present

## 2024-05-01 DIAGNOSIS — R000 Tachycardia, unspecified: Secondary | ICD-10-CM | POA: Diagnosis not present

## 2024-05-01 DIAGNOSIS — I509 Heart failure, unspecified: Secondary | ICD-10-CM | POA: Diagnosis not present

## 2024-05-01 DIAGNOSIS — M47812 Spondylosis without myelopathy or radiculopathy, cervical region: Secondary | ICD-10-CM | POA: Diagnosis not present

## 2024-05-01 NOTE — Progress Notes (Signed)
 Remote ICD Transmission

## 2024-05-02 ENCOUNTER — Encounter

## 2024-05-02 DIAGNOSIS — I10 Essential (primary) hypertension: Secondary | ICD-10-CM | POA: Diagnosis not present

## 2024-05-02 DIAGNOSIS — E119 Type 2 diabetes mellitus without complications: Secondary | ICD-10-CM | POA: Diagnosis not present

## 2024-05-02 DIAGNOSIS — R630 Anorexia: Secondary | ICD-10-CM | POA: Diagnosis not present

## 2024-05-02 DIAGNOSIS — G20A1 Parkinson's disease without dyskinesia, without mention of fluctuations: Secondary | ICD-10-CM | POA: Diagnosis not present

## 2024-05-02 DIAGNOSIS — I951 Orthostatic hypotension: Secondary | ICD-10-CM | POA: Diagnosis not present

## 2024-05-02 DIAGNOSIS — G8194 Hemiplegia, unspecified affecting left nondominant side: Secondary | ICD-10-CM | POA: Diagnosis not present

## 2024-05-03 DIAGNOSIS — G20A1 Parkinson's disease without dyskinesia, without mention of fluctuations: Secondary | ICD-10-CM | POA: Diagnosis not present

## 2024-05-03 DIAGNOSIS — R630 Anorexia: Secondary | ICD-10-CM | POA: Diagnosis not present

## 2024-05-03 DIAGNOSIS — I10 Essential (primary) hypertension: Secondary | ICD-10-CM | POA: Diagnosis not present

## 2024-05-03 DIAGNOSIS — E119 Type 2 diabetes mellitus without complications: Secondary | ICD-10-CM | POA: Diagnosis not present

## 2024-05-03 DIAGNOSIS — G8194 Hemiplegia, unspecified affecting left nondominant side: Secondary | ICD-10-CM | POA: Diagnosis not present

## 2024-05-03 DIAGNOSIS — I951 Orthostatic hypotension: Secondary | ICD-10-CM | POA: Diagnosis not present

## 2024-05-04 DIAGNOSIS — G20A1 Parkinson's disease without dyskinesia, without mention of fluctuations: Secondary | ICD-10-CM | POA: Diagnosis not present

## 2024-05-04 DIAGNOSIS — I951 Orthostatic hypotension: Secondary | ICD-10-CM | POA: Diagnosis not present

## 2024-05-04 DIAGNOSIS — R630 Anorexia: Secondary | ICD-10-CM | POA: Diagnosis not present

## 2024-05-04 DIAGNOSIS — G8194 Hemiplegia, unspecified affecting left nondominant side: Secondary | ICD-10-CM | POA: Diagnosis not present

## 2024-05-04 DIAGNOSIS — E119 Type 2 diabetes mellitus without complications: Secondary | ICD-10-CM | POA: Diagnosis not present

## 2024-05-04 DIAGNOSIS — I10 Essential (primary) hypertension: Secondary | ICD-10-CM | POA: Diagnosis not present

## 2024-05-05 DIAGNOSIS — G8194 Hemiplegia, unspecified affecting left nondominant side: Secondary | ICD-10-CM | POA: Diagnosis not present

## 2024-05-05 DIAGNOSIS — I951 Orthostatic hypotension: Secondary | ICD-10-CM | POA: Diagnosis not present

## 2024-05-05 DIAGNOSIS — G20A1 Parkinson's disease without dyskinesia, without mention of fluctuations: Secondary | ICD-10-CM | POA: Diagnosis not present

## 2024-05-05 DIAGNOSIS — I10 Essential (primary) hypertension: Secondary | ICD-10-CM | POA: Diagnosis not present

## 2024-05-05 DIAGNOSIS — E119 Type 2 diabetes mellitus without complications: Secondary | ICD-10-CM | POA: Diagnosis not present

## 2024-05-05 DIAGNOSIS — R630 Anorexia: Secondary | ICD-10-CM | POA: Diagnosis not present

## 2024-05-06 DIAGNOSIS — I951 Orthostatic hypotension: Secondary | ICD-10-CM | POA: Diagnosis not present

## 2024-05-06 DIAGNOSIS — E119 Type 2 diabetes mellitus without complications: Secondary | ICD-10-CM | POA: Diagnosis not present

## 2024-05-06 DIAGNOSIS — G8194 Hemiplegia, unspecified affecting left nondominant side: Secondary | ICD-10-CM | POA: Diagnosis not present

## 2024-05-06 DIAGNOSIS — I10 Essential (primary) hypertension: Secondary | ICD-10-CM | POA: Diagnosis not present

## 2024-05-06 DIAGNOSIS — G20A1 Parkinson's disease without dyskinesia, without mention of fluctuations: Secondary | ICD-10-CM | POA: Diagnosis not present

## 2024-05-06 DIAGNOSIS — R630 Anorexia: Secondary | ICD-10-CM | POA: Diagnosis not present

## 2024-06-03 ENCOUNTER — Encounter

## 2024-06-14 ENCOUNTER — Telehealth: Payer: Self-pay

## 2024-06-14 NOTE — Telephone Encounter (Signed)
 Spoke w/ granddaughter Bobetta, she states patient passed away last month. [facility] and Device CMA made aware.

## 2024-06-14 NOTE — Telephone Encounter (Signed)
 Patient is past due for CRT-D annual follow up with Dr. Parker.  Please reach out and schedule for next available.  Add to appointment notes:  Verify Remote Monitoring - patient has missed recent transmission.

## 2024-07-04 ENCOUNTER — Encounter

## 2024-08-05 ENCOUNTER — Encounter
